# Patient Record
Sex: Male | Born: 1940 | Race: White | Hispanic: No | Marital: Single | Smoking: Never smoker
Health system: Southern US, Community
[De-identification: ages and names within clinical notes are randomized; demographics above are authoritative.]

## PROBLEM LIST (undated history)

## (undated) DIAGNOSIS — R251 Tremor, unspecified: Secondary | ICD-10-CM

## (undated) DIAGNOSIS — E162 Hypoglycemia, unspecified: Secondary | ICD-10-CM

## (undated) DIAGNOSIS — E039 Hypothyroidism, unspecified: Secondary | ICD-10-CM

## (undated) DIAGNOSIS — I480 Paroxysmal atrial fibrillation: Secondary | ICD-10-CM

## (undated) DIAGNOSIS — I341 Nonrheumatic mitral (valve) prolapse: Secondary | ICD-10-CM

## (undated) DIAGNOSIS — I509 Heart failure, unspecified: Secondary | ICD-10-CM

## (undated) DIAGNOSIS — G250 Essential tremor: Secondary | ICD-10-CM

## (undated) DIAGNOSIS — R569 Unspecified convulsions: Secondary | ICD-10-CM

## (undated) DIAGNOSIS — K219 Gastro-esophageal reflux disease without esophagitis: Secondary | ICD-10-CM

## (undated) DIAGNOSIS — I340 Nonrheumatic mitral (valve) insufficiency: Secondary | ICD-10-CM

## (undated) DIAGNOSIS — R519 Headache, unspecified: Secondary | ICD-10-CM

## (undated) DIAGNOSIS — I2510 Atherosclerotic heart disease of native coronary artery without angina pectoris: Secondary | ICD-10-CM

## (undated) HISTORY — PX: TONSILLECTOMY: SUR1361

## (undated) HISTORY — DX: Nonrheumatic mitral (valve) prolapse: I34.1

## (undated) HISTORY — DX: Heart failure, unspecified: I50.9

## (undated) HISTORY — DX: Atherosclerotic heart disease of native coronary artery without angina pectoris: I25.10

## (undated) HISTORY — DX: Hypoglycemia, unspecified: E16.2

## (undated) HISTORY — DX: Nonrheumatic mitral (valve) insufficiency: I34.0

## (undated) HISTORY — DX: Headache, unspecified: R51.9

## (undated) HISTORY — DX: Paroxysmal atrial fibrillation: I48.0

## (undated) HISTORY — PX: OTHER SURGICAL HISTORY: SHX169

---

## 2000-10-08 ENCOUNTER — Encounter: Payer: Self-pay | Admitting: Cardiology

## 2000-10-08 ENCOUNTER — Encounter: Admission: RE | Admit: 2000-10-08 | Discharge: 2000-10-08 | Payer: Self-pay | Admitting: Cardiology

## 2000-10-12 ENCOUNTER — Ambulatory Visit (HOSPITAL_COMMUNITY): Admission: RE | Admit: 2000-10-12 | Discharge: 2000-10-12 | Payer: Self-pay | Admitting: Cardiology

## 2001-12-20 ENCOUNTER — Encounter: Payer: Self-pay | Admitting: Cardiology

## 2001-12-20 ENCOUNTER — Inpatient Hospital Stay (HOSPITAL_COMMUNITY): Admission: AD | Admit: 2001-12-20 | Discharge: 2001-12-22 | Payer: Self-pay | Admitting: Cardiology

## 2002-01-31 ENCOUNTER — Ambulatory Visit (HOSPITAL_COMMUNITY): Admission: RE | Admit: 2002-01-31 | Discharge: 2002-01-31 | Payer: Self-pay | Admitting: Cardiology

## 2002-06-09 ENCOUNTER — Ambulatory Visit (HOSPITAL_COMMUNITY): Admission: RE | Admit: 2002-06-09 | Discharge: 2002-06-09 | Payer: Self-pay | Admitting: Cardiology

## 2003-01-04 ENCOUNTER — Encounter: Admission: RE | Admit: 2003-01-04 | Discharge: 2003-01-04 | Payer: Self-pay | Admitting: Cardiology

## 2003-01-19 ENCOUNTER — Encounter: Admission: RE | Admit: 2003-01-19 | Discharge: 2003-01-19 | Payer: Self-pay | Admitting: Cardiology

## 2003-01-21 HISTORY — PX: MITRAL VALVE REPAIR: SHX2039

## 2003-01-24 ENCOUNTER — Inpatient Hospital Stay (HOSPITAL_COMMUNITY): Admission: AD | Admit: 2003-01-24 | Discharge: 2003-02-05 | Payer: Self-pay | Admitting: Cardiology

## 2003-01-26 ENCOUNTER — Encounter (INDEPENDENT_AMBULATORY_CARE_PROVIDER_SITE_OTHER): Payer: Self-pay | Admitting: *Deleted

## 2003-02-22 ENCOUNTER — Encounter: Admission: RE | Admit: 2003-02-22 | Discharge: 2003-02-22 | Payer: Self-pay | Admitting: Cardiothoracic Surgery

## 2003-02-22 IMAGING — CR DG CHEST 2V
2 series · 2 of 2 positions shown · non-contrast
Comparison: none

CLINICAL DATA: Status post mitral valve replacement one month ago.  Slight cough.
 CHEST X-RAY
 Two views of the chest are compared to a chest x-ray from [REDACTED] dated [DATE].  The mild edema pattern has resolved, as have the small effusions.  Cardiomegaly is stable.  Median sternotomy sutures are noted. 
 IMPRESSION
 1.  Resolution of mild edema and small effusions.  No active process.
 2.  Stable cardiomegaly.

[view not recorded (1 of 2)]
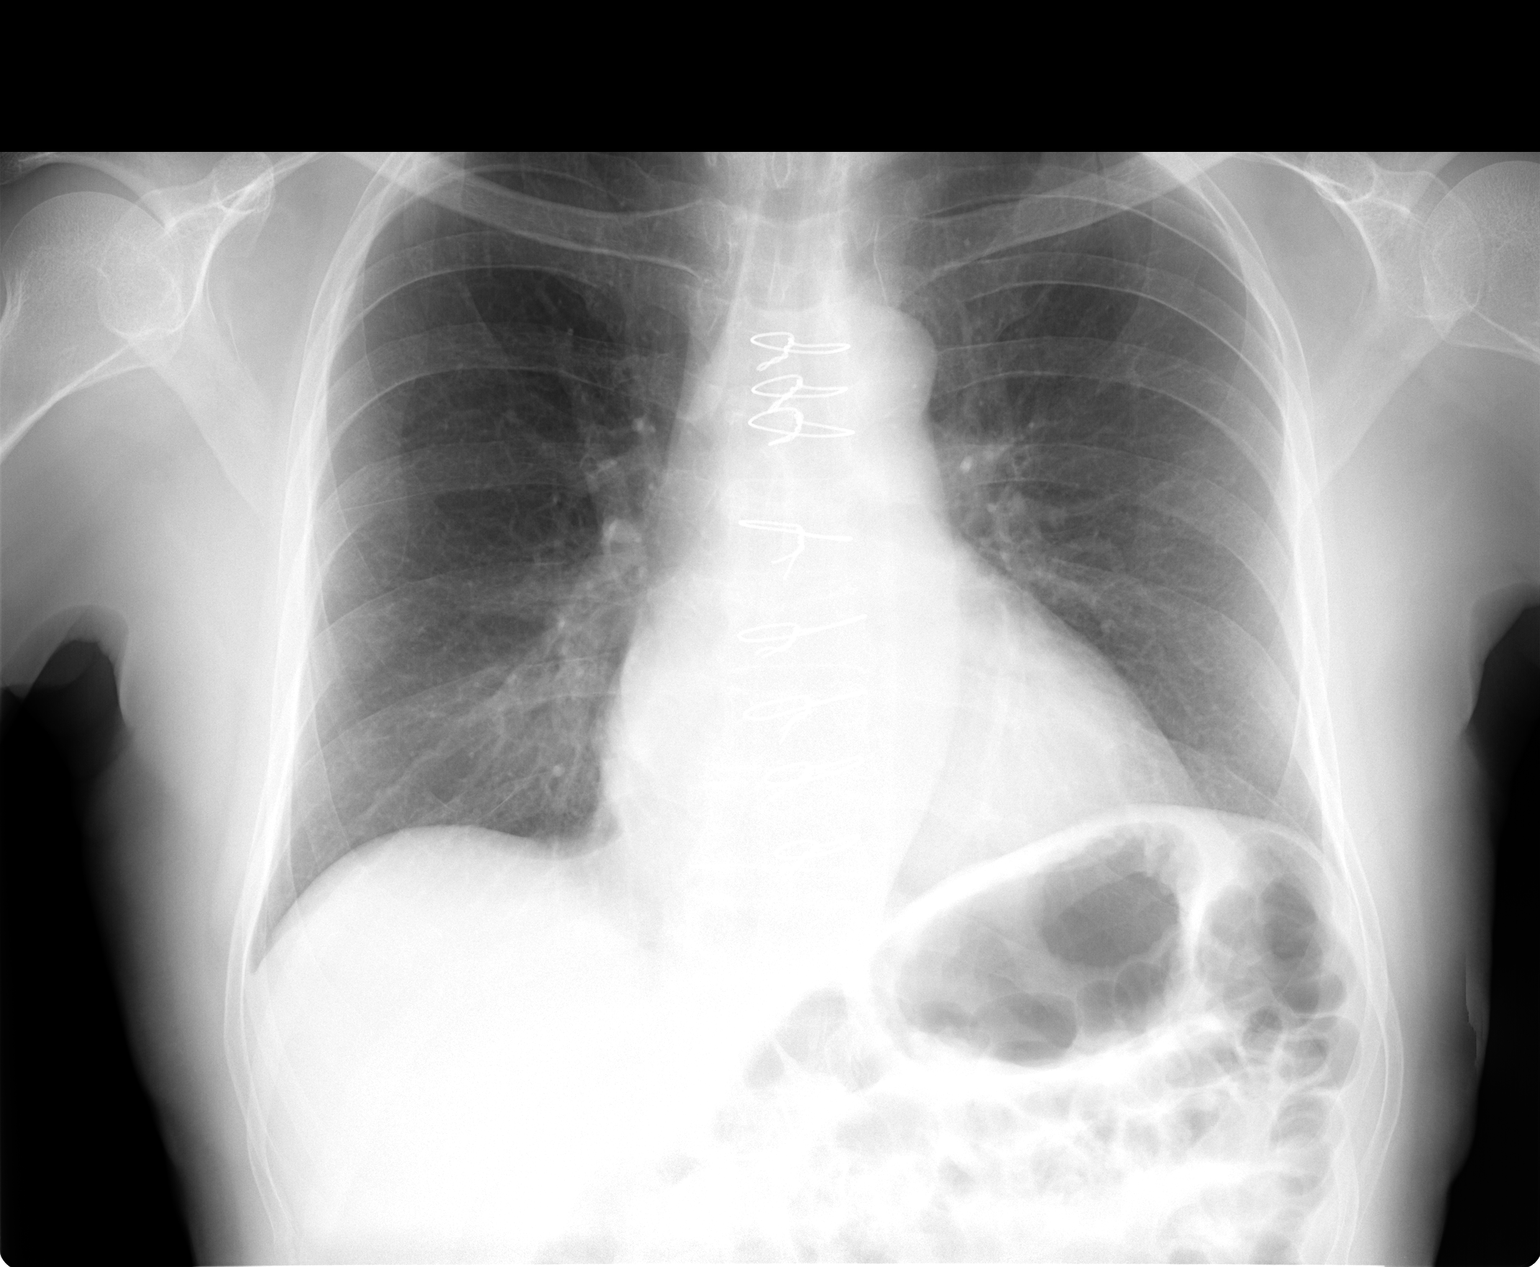

[view not recorded (2 of 2)]
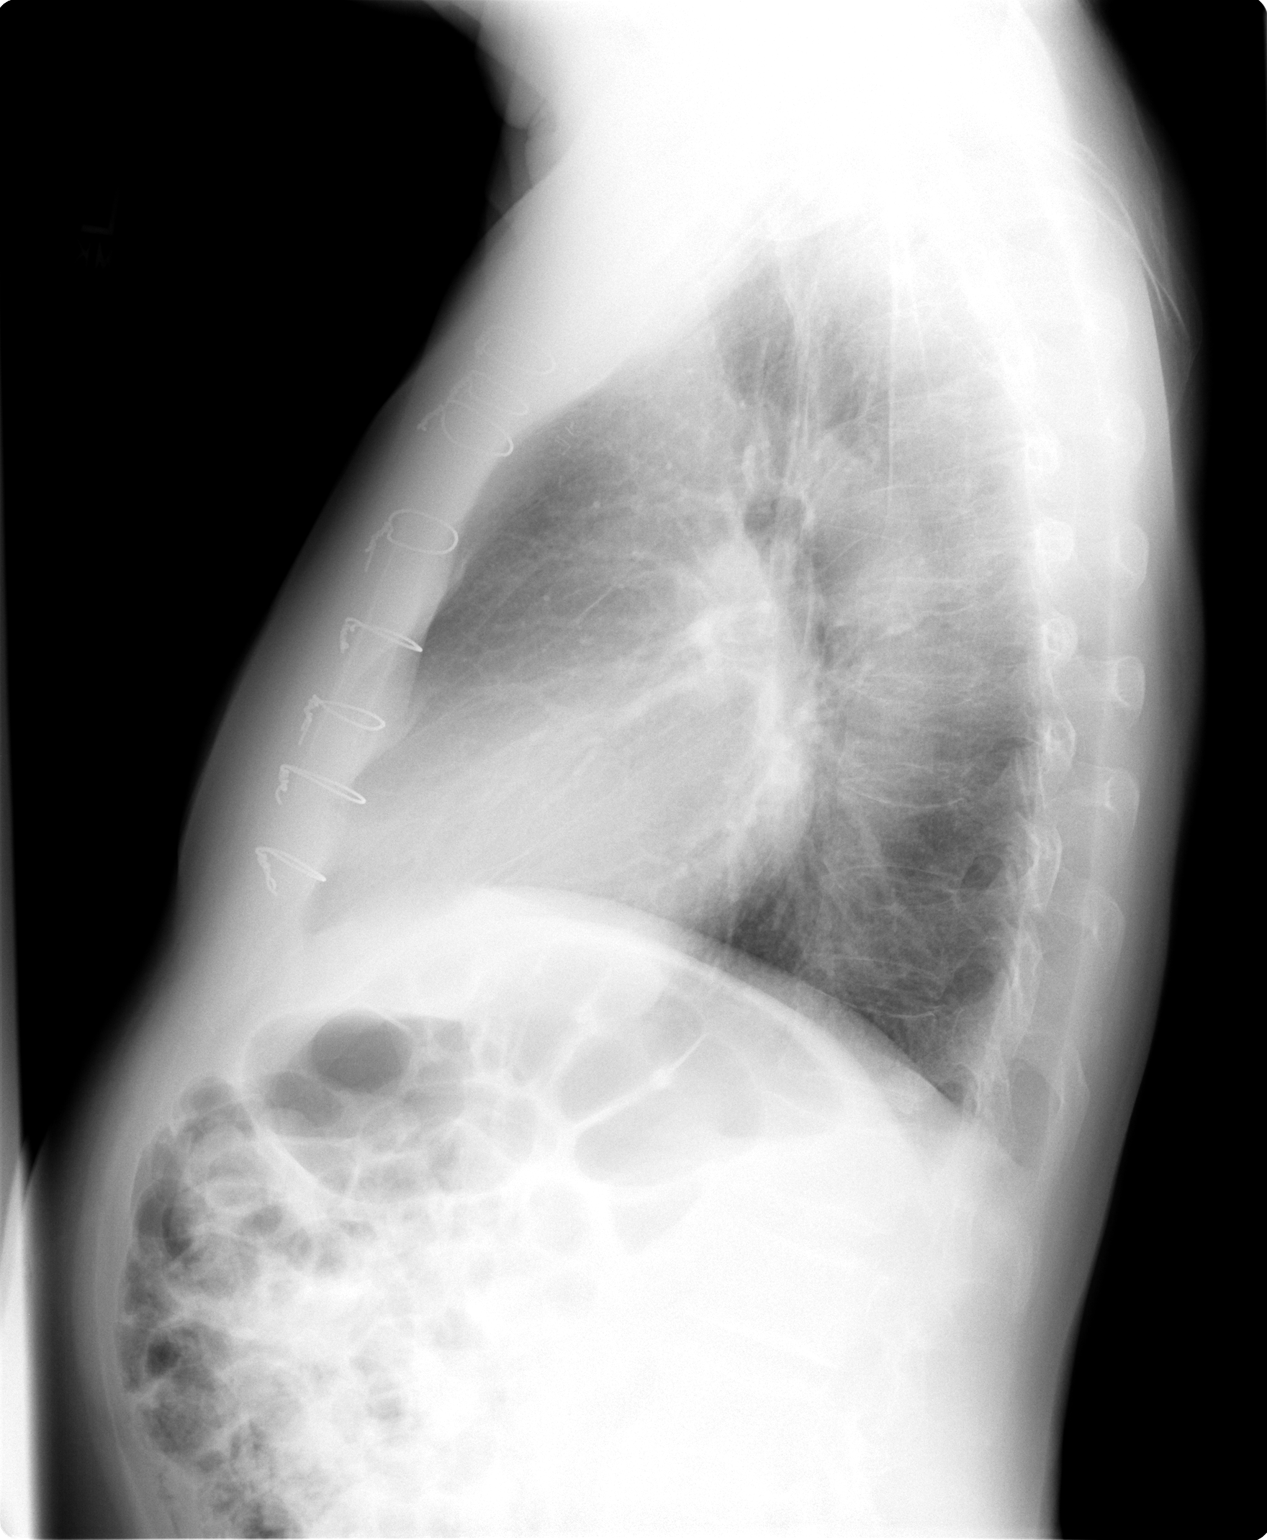

[2 of 2 positions shown; findings below may reference images not displayed]

## 2003-10-14 ENCOUNTER — Inpatient Hospital Stay (HOSPITAL_COMMUNITY): Admission: EM | Admit: 2003-10-14 | Discharge: 2003-10-15 | Payer: Self-pay | Admitting: Emergency Medicine

## 2003-10-14 IMAGING — CR DG CHEST 1V PORT
1 series · 1 of 1 positions shown · non-contrast
Comparison: none

CLINICAL DATA: Weakness.  
 PORTABLE CHEST [DATE] AT [2D] HOURS
 The heart is normal in size.  The lungs are clear.  Apical pleural thickening is stable.  Pulmonary vasculature is within normal limits. 
 IMPRESSION
 No evidence of acute cardiopulmonary disease.

[view not recorded]
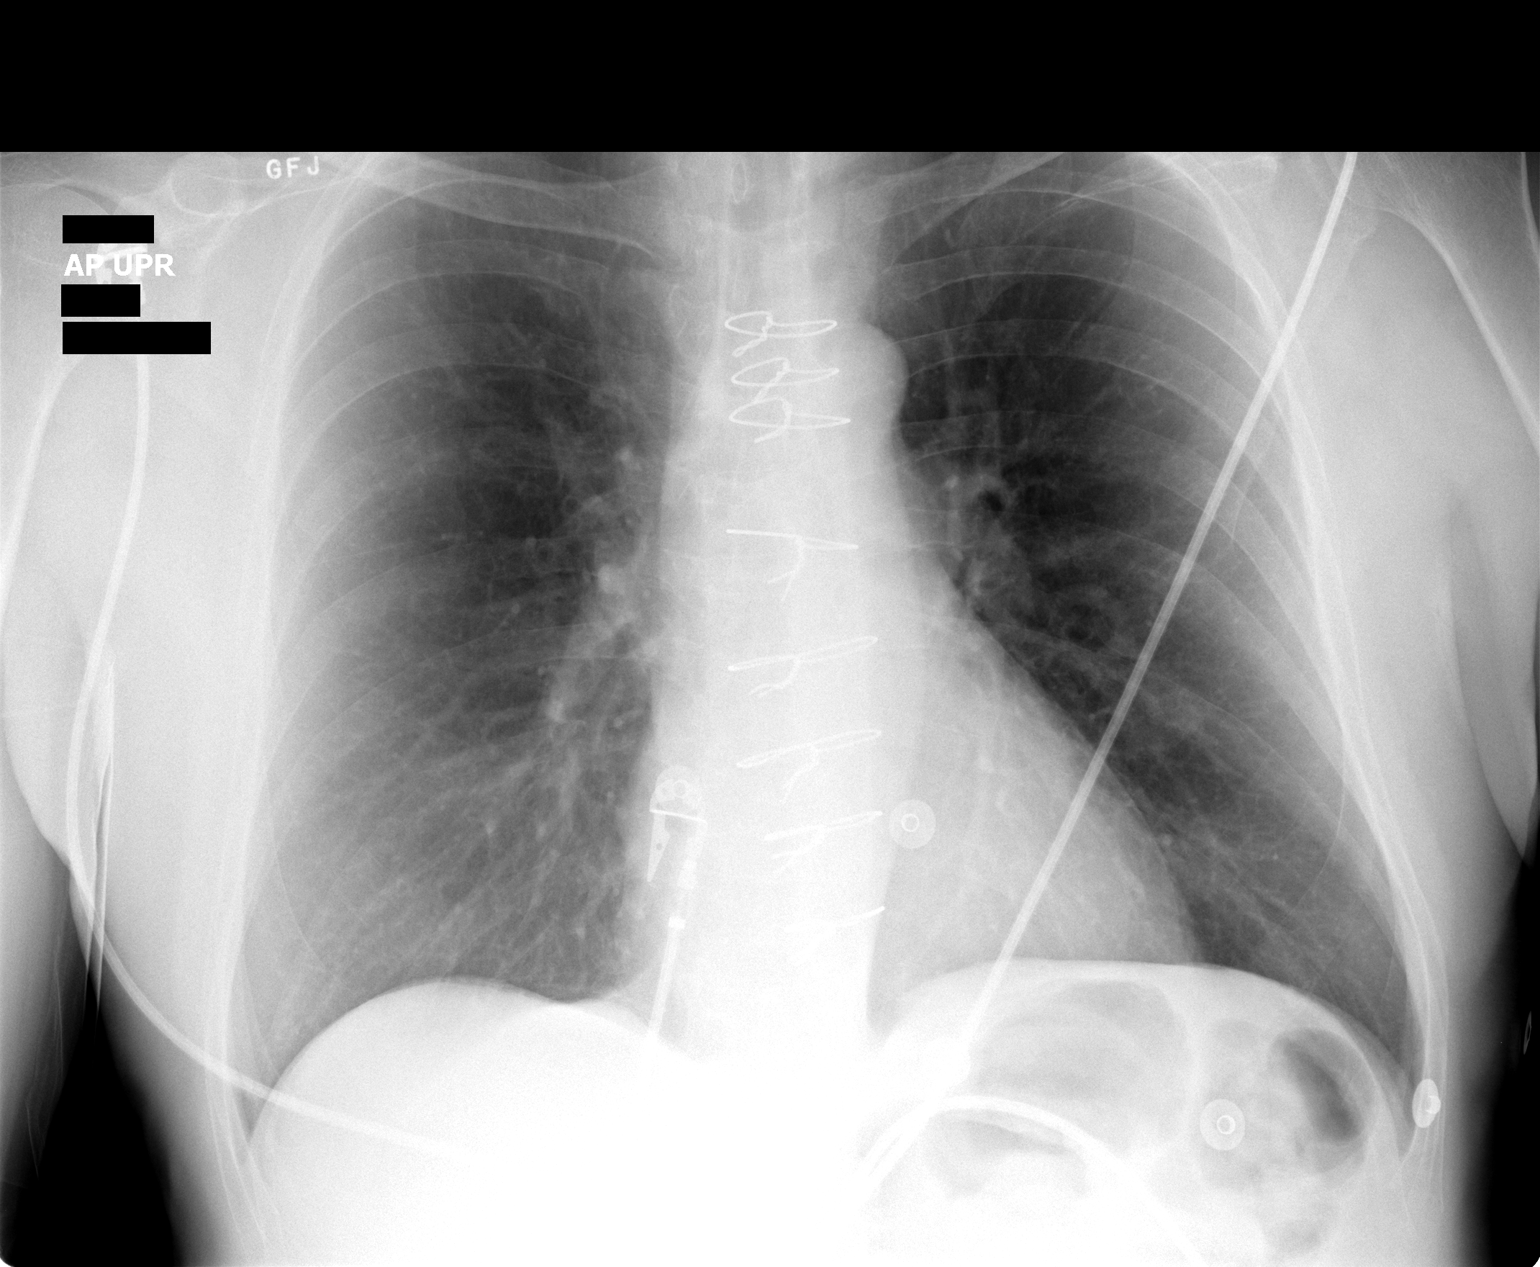

[1 of 1 positions shown; findings below may reference images not displayed]

## 2006-02-05 HISTORY — PX: OTHER SURGICAL HISTORY: SHX169

## 2006-09-25 ENCOUNTER — Emergency Department (HOSPITAL_COMMUNITY): Admission: EM | Admit: 2006-09-25 | Discharge: 2006-09-25 | Payer: Self-pay | Admitting: Emergency Medicine

## 2006-09-25 IMAGING — CR DG CHEST 2V
2 series · 2 of 2 positions shown · non-contrast
Comparison: [DATE].

CLINICAL DATA: Weakness.  Cough.  CABG.
 CHEST - 2 VIEW:

[w chest pa]
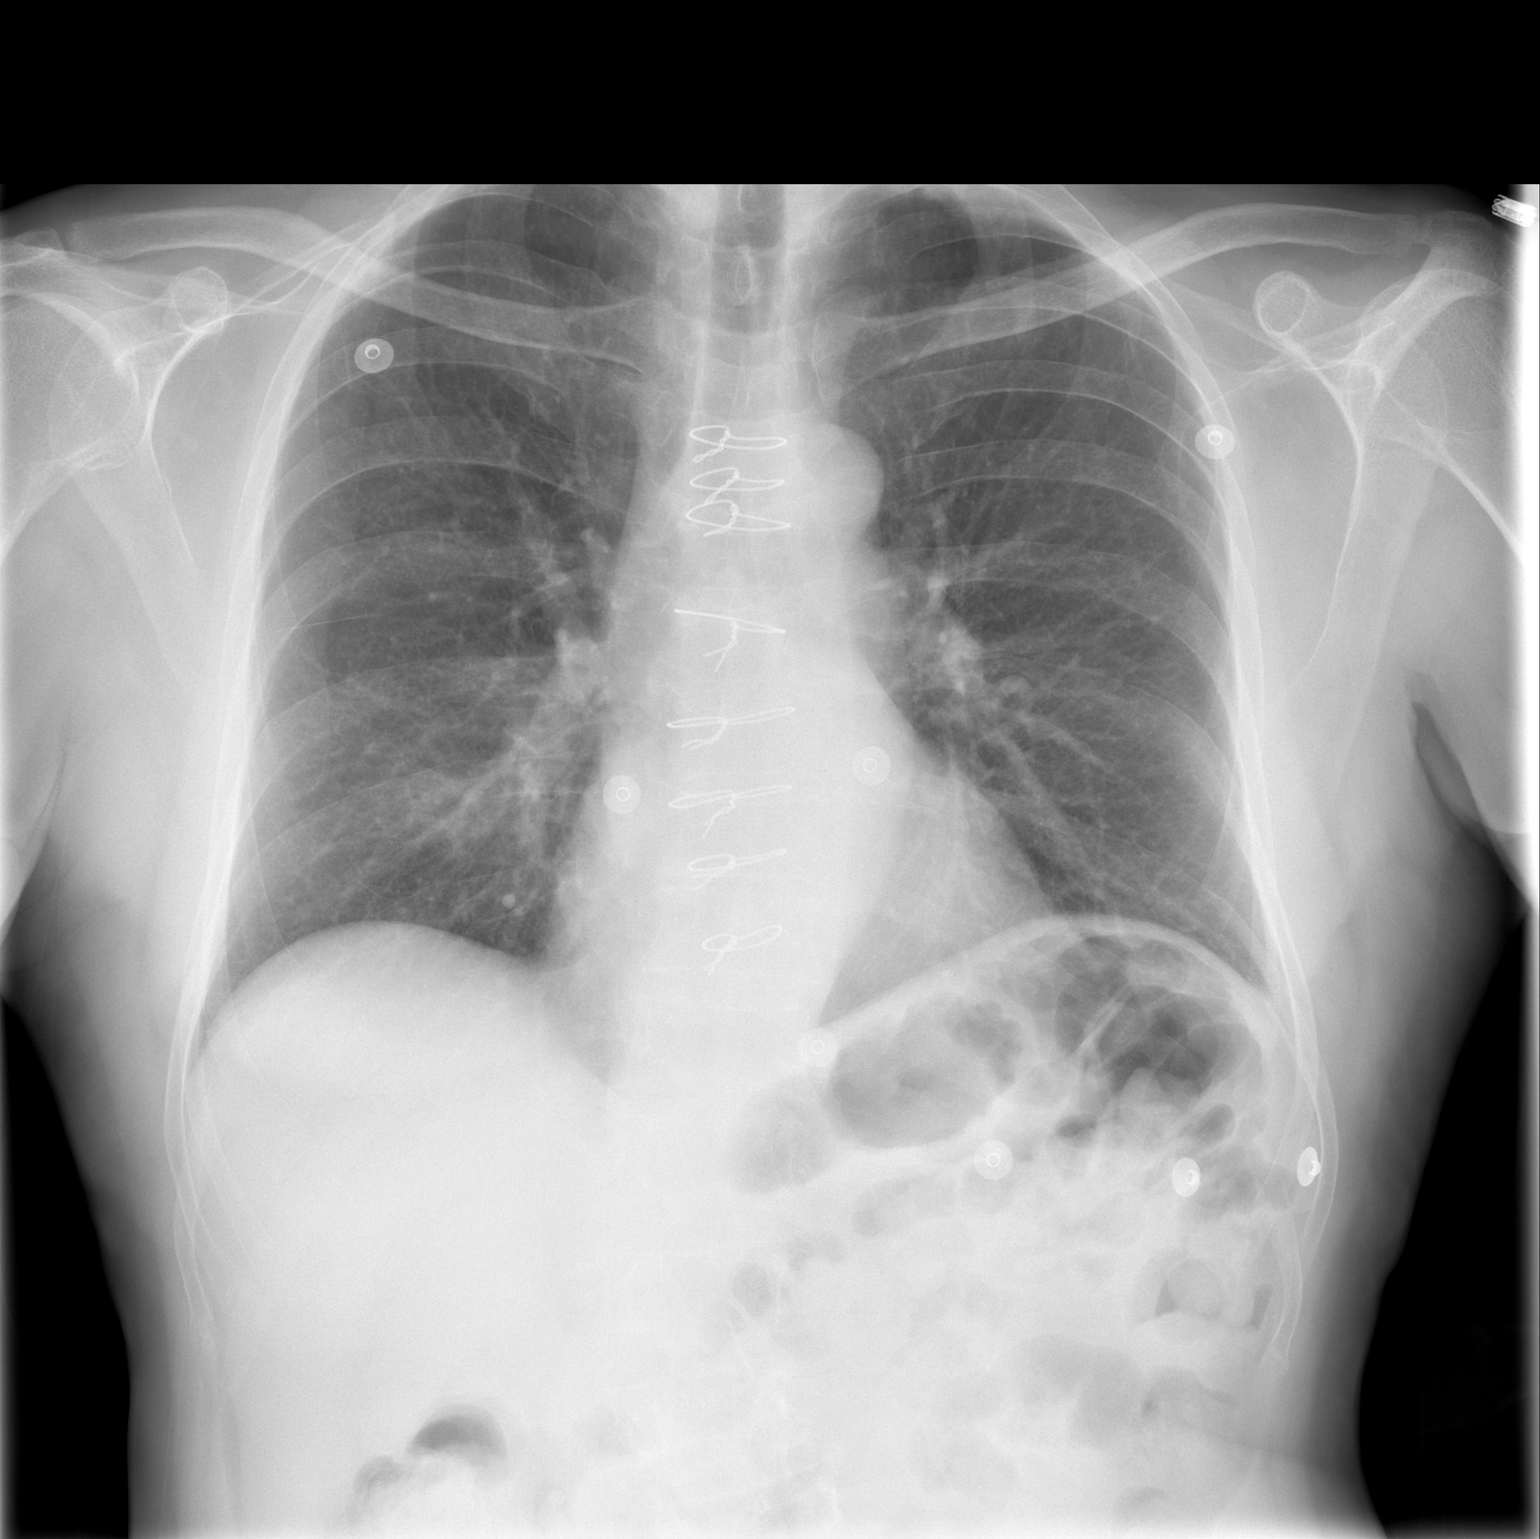

[w chest lat]
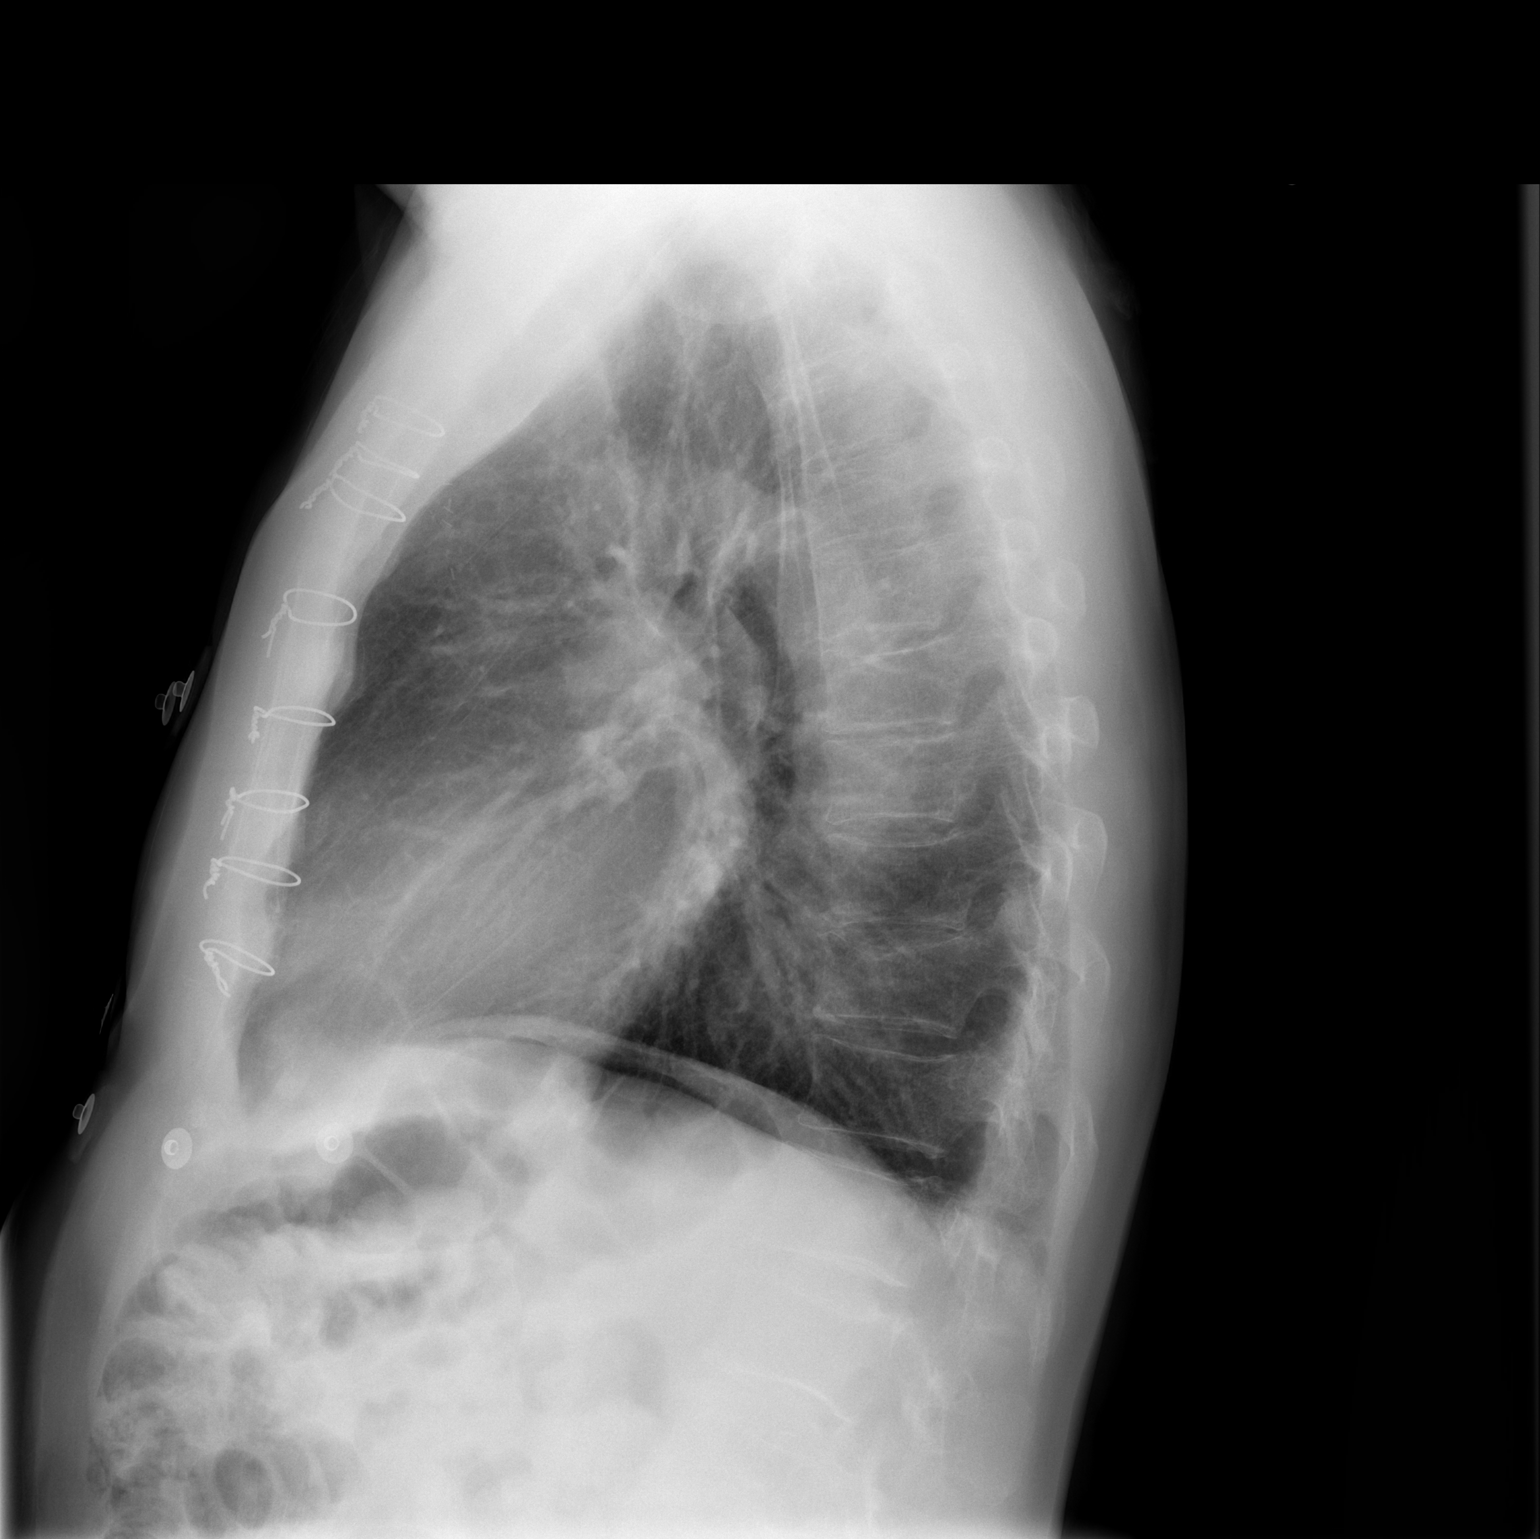

[2 of 2 positions shown; findings below may reference images not displayed]

FINDINGS: Sternal wire sutures are noted.  The heart size and mediastinal contours are within normal limits.  Both lungs are clear.  The visualized skeletal structures are unremarkable.
IMPRESSION: No active cardiopulmonary disease.

## 2010-06-07 NOTE — Consult Note (Signed)
NAME:  Austin Davis, Austin Davis                        ACCOUNT NO.:  [id number]   MEDICAL RECORD NO.:  [id number]                   PATIENT TYPE:  OIB   LOCATION:  6525                                 FACILITY:  [facility]   PHYSICIAN:  Mikey Bussing, M.D.           DATE OF BIRTH:  [birth year]   DATE OF CONSULTATION:  01/24/2003  DATE OF DISCHARGE:                                   CONSULTATION   REFERRING PHYSICIAN:  Thereasa Solo. Little, M.D.   PRIMARY CARE PHYSICIAN:  Nolon Nations, M.D. in [place].   REASON FOR CONSULTATION:  Severe mitral regurgitation with class III CHF and  atrial fibrillation.   CHIEF COMPLAINT:  Shortness of breath.   HISTORY OF PRESENT ILLNESS:  I was asked to evaluate this 70 year old white  male with recently diagnosed severe mitral regurgitation and recurrent  atrial fibrillation for potential mitral valve repair/replacement and a Maze  procedure.  The patient has been followed for mitral regurgitation over the  past few years and on serial echos, it has been moderate in intensity.  He  was noted to decline in his exercise tolerance, so that he gets short of  breath with regular activity as opposed to dyspnea with increased exertion  previously.  A recent two-dimensional echocardiogram showed his degree of  mitral regurgitation to be increased to severe and he was restudied with a  left and right heart catheterization today.  This demonstrated ejection  fraction of 45% with left ventricular dilatation, pulmonary artery pressures  of 62/32, normal coronaries, and severe 4+ mitral regurgitation.  He was in  atrial fibrillation on medications.  He was felt to be a candidate for  mitral valve repair with combined Maze procedure.  He was currently stable  in the hospital following cardiac catheterization and his Coumadin has been  off for approximately five days.  His INR today was 1.1.   PAST MEDICAL HISTORY:  1. 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  2. Atrial fibrillation, chronic.  3. Chronic Coumadin therapy.  4. Asthma.  5. Depression.  6. 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   ALLERGIES:  No known drug allergies.   MEDICATIONS:  1. Advair 250/50 b.i.d.  2. Paxil CR 25 mg daily.  3. Cardizem LA 180 mg daily.  4. Coumadin 7.5 mg alternating with 5 mg.  5. Ativan 0.5 mg b.i.d.  6. Lasix 40 mg daily.   SOCIAL HISTORY:  The patient does not smoke. He drinks two beers a week.  He  is single and has a girlfriend. He has two adult children.  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   FAMILY HISTORY:  No history of mitral valve disorder.  No history of  premature coronary disease.   REVIEW OF SYSTEMS:  The patient is right-hand dominant.  He sees a Education officer, community  twice a year and has had no recent  dental problems.  He denies any recent  respiratory infections requiring antibiotics or productive cough.  He denies  any significant bleeding problems with his chronic Coumadin therapy.  He has  had no previous surgical procedures other than his heart catheterization and  cardioversions.  His weight has been stable.  His vision has been stable,  but he has had some retinal tears in both eyes.   PHYSICAL EXAMINATION:  VITAL SIGNS:  He is 5 foot 10 inches and weight 165  pounds, blood pressure 115/80, pulse is 110 and irregular in atrial  fibrillation, oxygen saturation 95% on room air.  GENERAL:  Middle-aged, anxious male in the cardiac unit for observation  following cardiac catheterization.  He is in no acute distress.  HEENT:  Normocephalic and atraumatic.  Full EOM's.  NECK:  Without JVD, mass, or bruit.  LYMPHATICS:  No palpable adenopathy.  LUNGS:  Clear.  There is no thoracic deformity.  HEART:  Irregular tachycardia with a loud grade 4/6 holosystolic murmur  radiating to the left axilla. There is no S3 gallop.  ABDOMEN:  Soft and nontender without mass.  EXTREMITIES:  No  cyanosis, clubbing, or edema.  RECTAL:  Deferred.  VASCULAR:  2+ pulses in all extremities.  SKIN:  Without rash or lesion.  NEUROLOGY:  Alert and oriented x3 with full motor function.   LABORATORY DATA:  I reviewed his cardiac catheterization and agree with Dr.  Fredirick Maudlin interpretation of severe mitral regurgitation with left ventricular  dilatation and normal coronaries.  His atrial fibrillation has been more  difficult to control.  He would benefit from mitral valve repair/replacement  and a Maze procedure to help optimize his return to sinus rhythm.  I  discussed the details of the procedure with the patient and he is willing to  schedule his surgery while he is in the hospital at this time off his  Coumadin.  The surgery was scheduled for the morning of January 6.                                               Mikey Bussing, M.D.    PV/MEDQ  D:  01/24/2003  T:  01/24/2003  Job:  [id number]

## 2010-06-07 NOTE — Op Note (Signed)
Austin Davis, JUBB NO.:  [id number]   MEDICAL RECORD NO.:  [id number]                   PATIENT TYPE:  INP   LOCATION:  2399                                 FACILITY:  [facility]   PHYSICIAN:  Kerin Perna III, M.D.           DATE OF BIRTH:  01/09/1941   DATE OF PROCEDURE:  01/26/2003  DATE OF DISCHARGE:                                 OPERATIVE REPORT   PREOPERATIVE DIAGNOSIS:  Severe mitral regurgitation, paroxysmal atrial  fibrillation, congestive heart failure.   POSTOPERATIVE DIAGNOSIS:  Severe mitral regurgitation, paroxysmal atrial  fibrillation, congestive heart failure.   OPERATION PERFORMED:  1. Mitral valve repair with quadrangular resection of posterior leaflet,     sliding leaflet plasty, pericardial patch augmentation of T1 segment, and     ring annuloplasty.  2. Oversewing of left atrial appendage.  3. Maze procedure of the left atrium using Cardioblate system.   SURGEON:  Kerin Perna, M.D.   ASSISTANT:  1. Salvatore Decent. Cornelius Moras, M.D.  2. Pecola Leisure, [place]   ANESTHESIA:  General.   ANESTHESIOLOGIST:  Sheldon Silvan, M.D.   INDICATIONS FOR PROCEDURE:  The patient is a 70 year old male with  progressive symptoms of congestive heart failure, progressive atrial  fibrillation difficult to control, and a recent finding of severe mitral  regurgitation by 2D echocardiogram and cardiac catheterization.  His  coronaries were normal.  He is felt to be a candidate for mitral valve  repair and a Maze procedure.  Prior to surgery, I examined the patient in  his hospital room and reviewed the results of the cardiac catheterization  with the patient and his friends and family.  I discussed the indications  and expected benefits of mitral valve repair and Maze procedure for  treatment of his mitral regurgitation and paroxysmal atrial fibrillation.  I  reviewed the alternatives to surgery as well.  I discussed the major aspects  of the  planned operation including the choice for a mitral repair or a  mitral replacement, the location of the surgical incision, the use of  general anesthesia and cardiopulmonary bypass and the expected postoperative  hospital recovery.  I discussed with the patient the risks to him of mitral  valve repair and Maze procedure including the risks of MI, CVA, bleeding,  blood transfusion requirement, infection, recurrent atrial fibrillation, and  death.  He understood these implications for the surgery and agreed to  proceed with the operation as planned under what I felt was an informed  consent.   OPERATIVE FINDINGS:  The patient's heart was extremely dilated and globally  hypocontractile prior to bypass.  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  His cardiac output was 2.4 L per minute.  He was in  atrial fibrillation.  Following the mitral repair, the transesophageal echo  showed improved global left ventricular function with no mitral  regurgitation.   DESCRIPTION OF PROCEDURE:  The  patient was brought to the operating room and  placed supine on the operating table where general anesthesia was induced  under invasive hemodynamic monitoring.  The transesophageal 2-D  echocardiogram probe was placed by the anesthesiologist and the preoperative  diagnosis of severe mitral regurgitation was confirmed.  There was evidence  of a flail portion of the posterior mitral valve leaflet with severe  regurgitation.  A sternal incision was made and a sternotomy was performed.  Heparin was administered and the ACT was documented as being therapeutic.  The sternal retractor was placed and the pericardium was opened and  suspended in a pericardial cradle.  There was a large amount of pericardial  effusion drained.  Pursestrings were placed in the ascending aorta and right  atrium and the patient was cannulated and placed on bypass.  A second venous  cannula was placed into the inferior  vena cava.  Caval tapes were placed  both around the superior and inferior vena cava.  The interatrial groove was  dissected out and cardioplegia cannulas were placed for both antegrade and  retrograde delivery of cold blood cardioplegia.  The patient was cooled to  30 degrees and the aortic cross-clamp was applied.  A total of 800 mL of  cold blood cardioplegia was delivered to the aortic root and coronary sinus  in split doses with good cardioplegic arrest and septal temp dropping less  than 12 degrees.  Topical iced saline was used to augment myocardial  preservation and a pericardial insulator pad was used to protect the left  phrenic nerve.   The left atrial Maze procedure was performed first.  The Cardioblate system  was used.  The bipolar adjustable clamp was first placed around the left-  sided pulmonary veins with a cuff of left atrial tissue.  The bipolar device  was activated and left a lesion around the cuff of left pulmonary veins.  The bipolar device was then clamped around the left atrial appendage which  was also ablated using the radiofrequency lesion.  The left atrium was then  opened and the retractors were positioned.  The right-sided veins were then  isolated using the Cardioblate bipolar device passed posterior to the left  atrial wall and the device was activated thus encircling the right-sided  pulmonary veins with a surgical incision anterior and the radiofrequency  ablation line posteriorly.  Next, two ablation lines were accomplished on  the posterior left atrial wall using the bipolar device which was inserted  within the atrial chamber anteriorly and behind the atrial chamber in the  transverse sinus posteriorly.  The first ablation line was carried from the  right atrial pulmonary vein ablation lesion to the left-sided veins.  The  second line of ablation was carried from the right-sided pulmonary veins to the mitral annulus and this was completed right up to  the annulus using the  unipolar wand device.  The third lesion was carried from the pulmonary vein  encircling lesion to the left atrial appendage using the bipolar device.  This completed the Maze procedure within the left atrium.   Cardioplegia was redosed and was repeated every 20 minutes during cross-  clamp period.  The atrial retractor was then repositioned and the mitral  valve was inspected.  There was a large flail segment of P2 of the posterior  leaflet.  This also involved some of the medial aspect of the P1 segment.  The anterior leaflet appeared to be intact and the P3 segment of the  posterior leaflet also seemed to be intact.  The decision was made to make a  quadrangular resection of the flail leaflet which had several broken cords  along the free margin of the posterior leaflet.  This was performed and the  specimen sent to pathology.  The defect left of the posterior leaflet was  quite large and it was felt that a sliding leaflet plasty would be required  in order to reconstruct the valve.  Exposure of the posterior annulus was  enhanced by placing the 2-0 Ethibond annuloplasty sutures along the  posterior aspect of the valve and these were retracted.  Sliding leaflet  plasty incisions were made in the P3 segment and the P1 segment.  The P1  segment remaining after the quadrangular resection with limited tissue and  was felt that a patch augmentation would be required.  For that reason a  piece of pericardium was excised and treated with glutaraldehyde and rinsed  in saline.  While this was being performed, the P3 sliding leaflet plasty  was performed bringing the leaflet back towards the midportion of the  posterior annulus using a running 4-0 Ethibond suture.  After the P3 segment  was pulled back, attention was made to the autologous pericardial patch at  the P1 segment.  This was cut in a triangular configuration with the apex  being placed laterally underneath the  portion of P1 which was separated from  the annulus with a sliding incision.  A running 5-0 Ethibond was used to  place the pericardial autologous patch in the P1 segment.  This was  performed in two running suture lines.   Prior to doing the sliding leaflet plasty repairs, two large figure-of-eight  sutures of 2-0 Ethibond were placed in the center of the posterior annulus  bringing the annulus together and shortening the length of the posterior  leaflet.  After the sliding leaflet plasties were performed, the two edges  of the leaflets coapted very well and there was no tension. Two leaflets  were then sewn to each other in the midline using interrupted 5-0 Ethibonds.  After these sutures were tied the valve was inspected and had a symmetric  line of closure and good coaptation.  It was checked with cold saline and  there was minimal regurgitation.   The remaining annuloplasty sutures were placed around the circumference of the valve continuing around the anterior annulus.  Once all the annuloplasty  sutures were placed, the anterior leaflet was sized to a 30 mm Seguin  annuloplasty ring.  The annuloplasty sutures were then placed through the  annuloplasty ring and the ring was seated and the sutures were tied.  The  valve was then inspected and tested with cold saline and there was no  regurgitation.  The next step was to oversew the left atrial appendage which  was performed with two layers of a running 4-0 Prolene.  The atrium was then  irrigated with cold saline and the patient was rewarmed.  The left atriotomy  was closed with a running 4-0 Prolene in two layers.  Prior to closing the  atriotomy, air was vented from the left side of the heart using a dose of  retrograde warm blood cardioplegia and the usual deairing maneuvers.  The  atriotomy was then closed as the cross-clamp was removed.  The heart resumed  a spontaneous rhythm.  The patient was in a slow sinus rhythm and was   atrially paced after pacing wires were applied.  The patient was rewarmed to  37 degrees and cardioplegia cannula was removed.  The lungs were expanded  and the ventilator was resumed.  When the patient was warmed, he was weaned  from cardiopulmonary bypass on low dose dopamine and milrinone was added for  PA pressures of 45/25.  Cardiac output and blood pressure were stable and  protamine was administered.  The transesophageal 2-D echocardiogram post  bypass showed improved global left ventricular function with still some  posterolateral hypokinesia.  There was no mitral regurgitation residual  after the mitral repair.  The patient maintained an atrially paced sinus  rhythm.  Protamine was administered without adverse reaction.  The cannulas  were removed.  The mediastinum was irrigated with warm antibiotic  irrigation.  The superior pericardial fat was closed.  Two mediastinal chest  tubes were placed and brought out through a separate incision. The sternum  was closed with interrupted steel wire.  The pectoralis fascia was closed  with a running #1 Vicryl.  The subcutaneous and skin were closed with a  running Vicryl and sterile dressings were applied.  Total bypass time was  232 minutes with cross-clamp time of 175 minutes.                                               Mikey Bussing, M.D.    PV/MEDQ  D:  01/26/2003  T:  01/27/2003  Job:  [id number]   cc:   Thereasa Solo. Little, M.D.  [address]  [address]  [place]  [place] [address]  Fax: [phone]

## 2010-06-07 NOTE — H&P (Signed)
NAMEISHMEL, ACEVEDO                        ACCOUNT NO.:  [id number]   MEDICAL RECORD NO.:  [id number]                   PATIENT TYPE:  INP   LOCATION:  3734                                 FACILITY:  [facility]   PHYSICIAN:  Thereasa Solo. Little, M.D.              DATE OF BIRTH:  [birth year]   DATE OF ADMISSION:  12/20/2001  DATE OF DISCHARGE:                                HISTORY & PHYSICAL   ADMISSION DIAGNOSIS:  Rapid atrial fibrillation.   HISTORY OF PRESENT ILLNESS:  The patient is a 70 year old male who has known  mitral valve prolapse with moderate mitral regurgitation.  On Wednesday  (five days prior to this dictation) he had had URI type symptoms and at that  time began having generalized weakness, fatigue, and near syncope associated  with a rapid pulse.  This rapid pulse has continued until now and he  presents to the office with new onset rapid atrial fibrillation with rapid  ventricular response.   He denies chest pain, shortness of breath, PND, or orthopnea but he does  have dyspnea on exertion with minimal activity.  He had had a previous  cardiac catheterization on 10/12/00; 2+ MR, no coronary disease, normal LV  systolic function.  An echocardiogram in my office today, 12/20/01, shows  normal LV systolic function, left atrial dimension 5.2, moderate MR, pan  systolic prolapse of the posterior leads of the mitral valve with mild TR.  Of note, the left atrium 301 was only 3.7 cm.   He has been taking Drixoral several days for about a week intermittently for  URI type symptoms and has stopped his Lanoxin because of the URI, thinking  it made him feel bad.  He had had in the distant past some palpitations  which had been well controlled with Lanoxin but he had never had anything  that was sustained.   CURRENT MEDICATIONS:  1. Lanoxin 0.125 mg p.o. q.d. (he has not taken it in almost a week).  2. Aspirin 81 mg q.d.  3. Fosamax 40 mg q.week.   PAST MEDICAL HISTORY:  1.  Mitral valve prolapse.  2. Recurrent back problems.   SOCIAL HISTORY:  The patient is divorced.  He has two adult children.  He  has a Naval architect.  No cigarettes.  Occasional alcohol but not to  excess.  Moderate caffeine in the diet.   FAMILY HISTORY:  Positive for diabetes mellitus.   REVIEW OF SYSTEMS:  The patient uses SV prophylaxis on a regular basis.  He  has had no peripheral swelling or claudication.  He had not been sleeping  well with his URI and had complained of generalized aches and pains for  about a week, more myalgias.  He denies fever or chills.  Bowel habits  normal.  No blood in his bowel movement.  Appetite good.  Occasional  nocturia with no associated dysuria.  No musculoskeletal complaints other  than back.   PHYSICAL EXAMINATION:  VITAL SIGNS: Initial presentation to the office his  blood pressure was 100/80, he was in atrial fibrillation with ventricular  response 140+.  He was given 25 mg of oral Lopressor.  The heart rate slowed  to approximately 120.  Still atrial fibrillation with occasional flutter  waves and a blood pressure of 94/62.  NECK: Thyroid was nonpalpable.  No carotid bruits were heard.  LUNGS: Completely clear.  CARDIAC: Revealed a rapid irregular heart rhythm with a 2/6 systolic murmur  best heard at the apex of the heart.  ABDOMEN: Soft, nontender.  There were no masses.  I could not palpate the  liver edge.  EXTREMITIES: He had good pulses in the lower extremities.  No edema.  No  clubbing or cyanosis of the nailbeds.  SKIN: Warm and dry.   ASSESSMENT:  1. Atrial fibrillation with rapid ventricular response of about five days     duration with associated near syncope in the early portion four or five     days ago.  Relative hypotension.  2. Mitral valve prolapse with moderate mitral regurgitation resulting in     dilation of the atrium of 5.2 cm.  3. Normally the patient's blood pressure is around 110 systolic and in the      past had been on Lanoxin.  I had tried to avoid beta blockers and     Cardizem for his occasional palpitations because of its blood pressure     lowering effect.   PLAN:  The patient will be placed on IV heparin and Cardizem at 5 mg/h drip.  We will try to avoid a bolus and maintain a blood pressure greater than 90  systolic.  I will also restart his Lanoxin 0.25 mg b.i.d. today and q.d.  after that.  We will start him on Coumadin 7-1/2 mg q.d. with daily PTs.  A  CBC, TSH, and CMP have all been requested.                                               Thereasa Solo. Little, M.D.    ABL/MEDQ  D:  12/20/2001  T:  12/20/2001  Job:  [id number]

## 2010-06-07 NOTE — Discharge Summary (Signed)
NAME:  Austin Davis, Austin Davis NO.:  [id number]   MEDICAL RECORD NO.:  [id number]                   PATIENT TYPE:  INP   LOCATION:  2009                                 FACILITY:  [facility]   PHYSICIAN:  Kerin Perna, M.D.               DATE OF BIRTH:  01-30-1940   DATE OF ADMISSION:  01/24/2003  DATE OF DISCHARGE:  02/05/2003                                 DISCHARGE SUMMARY   HISTORY OF PRESENT ILLNESS:  This is a 70 year old male with a history of  mitral valve prolapse, mitral regurgitation, and paroxysmal atrial  fibrillation dating back to March 2001.  On a 2-D echocardiogram dated March  2001, the patient showed prolapse of the posterior leaflet of the mitral  valve with moderate MR, left atrium 3.2 cm, and normal left ventricular  function.  A cardiac catheterization done in September 2002 showed 2+ mitral  regurgitation, mitral valve prolapse, and normal coronary arteries.  He has  had recurrent atrial fibrillation requiring cardioversion in January 2004 as  well as May 2004.  An echocardiogram dated December 2003 showed his left  atrium to be 5.2 cm, normal left ventricular function, and moderate mitral  regurgitation.   He was seen recently and found to be in recurrent atrial fibrillation with  rapid ventricular response.  He was subsequently scheduled for cardiac  catheterization, admitted to the hospital for further medical management and  possible surgical intervention.   ALLERGIES:  STEROIDS increase heart rate.   MEDICATIONS ON ADMISSION:  1. Advair 250/50 b.i.d.  2. Paxil CR 25 mg daily.  3. Cardizem LA 180 mg daily.  4. Coumadin 7.5 mg Monday, Wednesday, and Friday, 5 mg Tuesday, Thursday,     Saturday, and Sunday.  5. Lorazepam 0.5 mg q.12h.  6. Vi-q-tuss 1 teaspoon q.6h.  7. Lasix 40 mg daily.   PAST MEDICAL HISTORY:  1. Hyperglycemia ?  2. History of cervical neck problems.  3. History of degenerative arthritis.  4. History of  depression.  5. History of asthma.  6. History of atrial fibrillation.  7. History of congestive heart failure.   For Social History, Family History, Review of Systems, and Physical  Examination, please see History and Physical done at time of admission.   HOSPITAL COURSE:  The patient was admitted and subsequently scheduled for  cardiac catheterization which was done on January 24, 2003, by Dr. Clarene Duke.  Findings included ejection fraction of 45% with left ventricular dilatation.  Pulmonary artery pressure was measured at 62/32.  He had normal coronary  arteries.  He had severe 4+ mitral regurgitation.  He was felt to be a  candidate for mitral valve repair with combined maze procedure, and this was  referred to Dr. Donata Clay for opinion. Dr. Donata Clay agreed to proceed with  the procedure.   Procedure:  On January 26, 2003, the patient was taken to the cardiac  operating  room where he underwent the following procedure.  1) Mitral valve  repair with quadrangular resection of posterior leaflet, sliding  leafletplasty, pericardial patch augmentation of T1 segment, and ring  Annuloplasty.  2)  Oversewing of left atrial appendage.  3)  Maze procedure  of the left atrium using the Cardioblade system.  The patient tolerated the  procedure well and was taken to the surgical intensive care unit in stable  condition.   Postoperative hospital course:  The patient has overall done quite well.  He  did have some intermittent atrial fibrillation/flutter events during the  postoperative period, and he has been treated medically as well as started  back on his Coumadin therapy.  He currently maintains a normal sinus rhythm.  He is otherwise hemodynamically stable.  He has required a moderate amount  of diuresis.  All routine lines, monitoring and drainage devices were  discontinued in a standard fashion.   The patient underwent aggressive pulmonary toilet and routine cardiac  rehabilitation.  He  did have some difficulties with sore throat and thrush  during the postoperative period, but this was treated and controlled to full  resolution.  The patient's rapid strep testing was negative on two  occasions.  His incisions are healing well without signs of infection.  His  most recent INR dated February 05, 2003, is 3.2.  He is felt to be quite  stable for discharge on today's date, February 05, 2003.   DISCHARGE MEDICATIONS:  1. Paxil 25 mg daily.  2. Xanax 0.25 mg twice daily.  3. Amiodarone 200 mg twice daily.  4. Coumadin 2.5 mg daily and as directed.  First INR to be drawn February 06, 2003, at Dr. Fredirick Maudlin office.  5. Advair 250/50 inhaler 1 puff twice daily.  6. Folic acid 1 mg daily.  7. Niferex 150 mg daily.  8. Magic Mouthwash 5 ml q.6h. swish and swallow.  9. Ultram 50 mg every 6 hours as needed.   ACTIVITY:  The patient is instructed not to drive, no heavy lifting more  than approximately 10 pounds.  He is to keep walking as instructed, increase  as tolerated.   DIET:  Low-saturated-fat, low-salt.   WOUND CARE:  The patient may shower and clean his incisions gently with soap  and water.   FOLLOW UP:  Will include Dr. Clarene Duke on March 07, 2003; Corine Shelter, P.A.,  on January 28.  Pro time to be drawn at the [place] office.  Dr. Clarene Duke  to manage dosing.  Followup with Dr. Donata Clay will be Friday, February 4 at  10 a.m. with a chest x-ray from the [facility].   FINAL DIAGNOSES:  1. Severe mitral regurgitation with Class III congestive heart failure.  2. Chronic atrial fibrillation, chronic Coumadin therapy.  3. History of asthma.  4. History of depression.  5. 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   LABORATORY DATA:  Most recent laboratories:  INR 3.2 on February 05, 2003.  Electrolytes: BUN and creatinine on February 04, 2003, as follows.  Sodium 133, potassium 4.1, chloride 102, carbon dioxide 25, BUN  9, creatinine 0.9,  glucose 91.  White blood cell count 9.6, hemoglobin 10, hematocrit 31,  platelet count 453.      Rowe Clack, P.A.-C.                    Kerin Perna, M.D.    Sherryll Burger  D:  02/05/2003  T:  02/05/2003  Job:  [id number]   cc:   Thereasa Solo. Little, M.D.  [address]  [address]  [place]  [place] [address]  Fax: [phone]   Kayleen Memos, M.D.

## 2010-06-07 NOTE — Cardiovascular Report (Signed)
   NAME:  Austin Davis, Austin Davis                        ACCOUNT NO.:  [id number]   MEDICAL RECORD NO.:  [id number]                   PATIENT TYPE:  OIB   LOCATION:  2854                                 FACILITY:  [facility]   PHYSICIAN:  Thereasa Solo. Little, M.D.              DATE OF BIRTH:  [birth year]   DATE OF PROCEDURE:  01/31/2002  DATE OF DISCHARGE:                              CARDIAC CATHETERIZATION   CARDIOVERSION NOTE:   DATE OF PROCEDURE:  01/31/02   INDICATIONS FOR TEST:  [name] is a 70 year old male who  developed atrial fibrillation the first of December.  He has been on  Coumadin now for at least five weeks with a therapeutic international  normalized ratio of 4.  He has mitral valve prolapse with moderate mitral  regurgitation and his atrial dimension on his echocardiogram on 12/03 was  5.2 cm.   PROCEDURE:  After obtaining appropriate level of anesthesia with 150 mg of  IV Ditropan, the patient was cardioverted with 200 watt seconds and  converted to sinus rhythm with a rate of 71.   He should be ready for discharge in two hours and will be re-evaluated in my  office tomorrow.   CONCLUSION:  Successful cardioversion from atrial fibrillation to sinus  rhythm.                                               Thereasa Solo. Little, M.D.    ABL/MEDQ  D:  01/31/2002  T:  01/31/2002  Job:  [id number]

## 2010-06-07 NOTE — Cardiovascular Report (Signed)
Austin Davis, Austin Davis                        ACCOUNT NO.:  [id number]   MEDICAL RECORD NO.:  [id number]                   PATIENT TYPE:  OIB   LOCATION:  2899                                 FACILITY:  [facility]   PHYSICIAN:  Thereasa Solo. Little, M.D.              DATE OF BIRTH:  08/18/1940   DATE OF PROCEDURE:  01/24/2003  DATE OF DISCHARGE:                              CARDIAC CATHETERIZATION   INDICATIONS FOR TEST:  Austin Davis is a 70 year old male who has mitral valve  prolapse and mitral regurgitation.  He had had a cardiac catheterization  performed September of 2002 that showed moderate MR and normal LV function.  Some time around the end of November he had a URI that did not clear and was  ultimately diagnosed as mild congestive heart failure.  Because of this he  is brought in for outpatient cardiac catheterization assuming that this is  the sentinel event of failure that will prompt valve replacement.   PROCEDURE:  The patient was prepped and draped in the usual sterile fashion  exposing the right groin.  Following local anesthetic with 1% Xylocaine the  Seldinger technique was employed and an 8-French introducer sheath was  placed into the right femoral vein, a 5-French introducer sheath in the  right femoral artery.  An 8-French Swan-Ganz catheter was then advanced  through its normal route into the pulmonary artery with hemodynamic  monitoring undertaken throughout each station.  Cardiac output by  thermodilution was then performed.  Oxygen saturations in the AO and in the  PA were performed.   A pigtail catheter was then advanced into the left ventricular cavity and  ventriculography in the RAO projection was performed.  Following this a Swan-  Ganz pullback was performed and then selective left and right coronary  arteriography.   COMPLICATIONS:  None.   EQUIPMENT:  An 8-French Swan-Ganz catheter, 5-French Judkins configuration  catheters.   RESULTS:  HEMODYNAMIC  MONITORING:  Right atrial pressure 11.  Right ventricular pressure 55/9.  Pulmonary  artery pressure 62/32.  Wedge 28 with a V-wave of 36.  Central aortic  pressure was 91/78.  Left ventricular pressure was 93/10.  The patient was  in atrial fibrillation with a ventricular response of around 100.   Cardiac output by thermodilution was 3.5 L/minute for a cardiac index of  1.8.   Fick output using AO saturation 93% and pulmonary artery saturation at 57%  resulted in a cardiac output of 3.3 L/minute and a cardiac index of 1.7.   VENTRICULOGRAPHY:  Ventriculography in the RAO projection revealed mild dilatation of the left  ventricle with mild global hypokinesis.  Ejection fraction calculated at  47%.  The end-diastolic pressure was 10.  There was 4+ mitral regurgitation  and the left atrial size was greater than the left ventricular cavity size.   CORONARY ARTERIOGRAPHY:  1. Left main normal.  2. LAD:  The LAD crossed  the apex of the heart, gave rise to two small     diagonals.  This system was free of disease.  3. Circumflex:  The circumflex gave rise to two small OM vessels that were     free of disease.  4. Right coronary artery:  This was a dominant vessel with a PDA and two     __________ posterolateral branches that were free of disease.   CONCLUSION:  1. No evidence of coronary artery disease.  2. Mild to moderate left ventricular dysfunction with a 47% ejection     fraction.  3. Marked/severe dilatation of the left atrium.  4. 4+ mitral regurgitation.  5. Elevated pulmonary artery pressures at 62/32.  6. Atrial fibrillation.   At this point the patient will be referred to CVTS for valve replacement  repair.  It may be that since he has had a history of intermittent atrial  fibrillation and is back in atrial fibrillation, a maze procedure may be  indicated at the same time.  The patient will be discharged to home today,  follow up in my office tomorrow, and will see CVTS  later this week.  For now  his Coumadin will be on hold until his upcoming surgery.                                               Thereasa Solo. Little, M.D.    ABL/MEDQ  D:  01/24/2003  T:  01/24/2003  Job:  [id number]   cc:   CVTS   Lucianne Lei, M.D.   Cath Lab

## 2010-06-07 NOTE — Cardiovascular Report (Signed)
   NAMEDALLIN, MCCORKEL                        ACCOUNT NO.:  [id number]   MEDICAL RECORD NO.:  [id number]                   PATIENT TYPE:  OIB   LOCATION:  2899                                 FACILITY:  [facility]   PHYSICIAN:  Thereasa Solo. Little, M.D.              DATE OF BIRTH:  August 14, 1940   DATE OF PROCEDURE:  06/09/2002  DATE OF DISCHARGE:                              CARDIAC CATHETERIZATION   INDICATIONS FOR PROCEDURE:  Mr. Beigel is a 70 year old male who has mild to  moderate mitral regurgitation and a left atrial dimension of 5.2 cm.  He has  had an episode of paroxysmal atrial fibrillation in December, and underwent  elective cardioversion in January 2004.  This was successful and he has had  no recurrent problems until 48 hours prior to this when he noticed more  tiredness and fatigue, and found to be back in atrial fibrillation with a  ventricular response in the 50's to 60's.  With an appropriate INR, he was  brought in for outpatient cardioversion.   DESCRIPTION OF PROCEDURE:  With anesthesia's assistance, he was given 150 mg  of IV penathol.  Once an appropriate level of anesthesia was obtained, he  was cardioverted using anterior posterior paddles at 200 watt seconds, he  converted to sinus rhythm with a single shock at a rate of 64.   He should be ready for discharge later today.  My biggest concern is with  his left atrial dimension being so large that he may not be a long-term  candidate to remain in sinus rhythm.  It should be pointed out that he was  relatively bradycardic with his atrial fibrillation on very low dose  Cardizem (180 mg once a day), and digoxin 0.25 mg, but had a subtherapeutic  digoxin level at 0.06.                                               Thereasa Solo. Little, M.D.    ABL/MEDQ  D:  06/09/2002  T:  06/09/2002  Job:  [id number]

## 2010-06-07 NOTE — Op Note (Signed)
Austin Davis, Austin Davis                        ACCOUNT NO.:  [id number]   MEDICAL RECORD NO.:  [id number]                   PATIENT TYPE:  INP   LOCATION:  2399                                 FACILITY:  [facility]   PHYSICIAN:  Sheldon Silvan, M.D.                   DATE OF BIRTH:  May 10, 1940   DATE OF PROCEDURE:  01/26/2003  DATE OF DISCHARGE:                                 OPERATIVE REPORT   PROCEDURE PERFORMED:  Intraoperative transesophageal echocardiography (TEE).   INDICATIONS FOR PROCEDURE:  [name] was brought to the operating room  today by Dr. Donata Clay for mitral valve repair or replacement.  It was felt  that during operation, the patient would benefit from use of TEE for both  diagnostic and monitoring purposes.   DESCRIPTION OF PROCEDURE:  After successful induction of general anesthesia  and endotracheal intubation, a sheath was placed on the [employer]  OmniPlane probe and it was lubricated adequately.  It was passed  successfully through the oropharynx after one pass in an atraumatic fashion.   It was noted to be in the esophagus and heart images appeared quite readily.  The left ventricle was imaged and found to be not violated or thickened.  There was a slightly hypokinetic area in the posteromedial portion of the  left ventricle.   The mitral valve was imaged and it was seen to be billowing in nature along  the posterior leaflet.  There was severe prolapse noted.  On Color Flow exam  there was 4+ regurgitation noted across the area where the posterior leaflet  was not coapting with the anterior leaflet.  There was no obvious sign of  chordae that were ruptured.  The left atrium was quite dilated.  The left  atrial appendage was imaged and found to have no clot or smoke.  The  pulmonary veins were imaged and there was reversal of flow noted on Doppler  exam.   Aortic valve was seen and was tricuspid in nature with good apposition of  the cusps.  There was  trace regurgitation through the aortic valve on Color  Flow exam.  The tricuspid valve only showed trace regurgitation as well.  Otherwise, it was normal in structure.  There was no PFO noted across the  interatrial septum under Color Flow exam.   The patient was placed on cardiopulmonary bypass by Dr. Donata Clay.  A maze  procedure was performed as well as repair of the mitral valve and placement  of annular ring.  Bypass was slowly weaned and on filling of the heart,  examination of the mitral valve showed that there was trace to 1+  regurgitation on Color Flow exam.  The left ventricle was unchanged in its  appearance to prebypass.  There was good apposition of the mitral valve  leaflets.  The aortic valve was unchanged and the rest of the examination of  the  heart was unchanged.  At the conclusion of the procedure, on moving to  the intensive care unit the probe was removed without difficulty.                                               Sheldon Silvan, M.D.    DC/MEDQ  D:  01/26/2003  T:  01/27/2003  Job:  [id number]

## 2010-06-07 NOTE — Discharge Summary (Signed)
Austin Davis, Austin Davis              ACCOUNT NO.:  [id number]   MEDICAL RECORD NO.:  [id number]          PATIENT TYPE:  INP   LOCATION:  4740                         FACILITY:  [facility]   PHYSICIAN:  Richard A. Alanda Amass, M.D.DATE OF BIRTH:  [birth year]   DATE OF ADMISSION:  10/14/2003  DATE OF DISCHARGE:  10/15/2003                                 DISCHARGE SUMMARY   DISCHARGE DIAGNOSES:  1.  Supraventricular tachycardia, resolved.  2.  Mitral valve repair and Maze procedure on January 26, 2003.   HISTORY OF PRESENT ILLNESS:  Presented to [facility] Emergency Room on October 14, 2003, after experiencing rapid heart rate at work.   The patient did have a history of atrial fibrillation post-operation,  treated with Amiodarone, was in sinus rhythm.  The Amiodarone was  discontinued approximately three weeks prior to this admission.  Since that  time, he has had episodic weakness, usually while he is working.  The day of  admission, he was at work, he developed a rapid heart rate, driven by a  friend to the emergency room.  Blood pressure initially was 95, he was weak.  He was given an IV fluid bolus and converted to a sinus rhythm.  Blood  pressure came up to 105.   He has had continued symptoms of exercise intolerance, no history of  diabetes.  He was admitted to [facility] for overnight observation with  plans to discharge the next morning which we did.   PAST MEDICAL HISTORY:  1.  Recent surgery as stated with postoperative atrial fibrillation.  He did      normal __________ at the cath.  His EF was 45%, he had 4+ mitral      regurgitation.  2.  He then on January 26, 2003, underwent mitral valve repair and ring      annuloplasty over __________ left atrial appendage, a Maze procedure to      the left atrium, and the patient tolerated the procedure well.  3.  Possible hypo/hyperglycemia.  4.  History of cervical neck problems.  5.  Degenerative arthritis.  6.  Depression.  7.   Asthma.  8.  History of atrial fibrillation prior to the surgery.  9.  History of congestive heart failure.   MEDICATIONS:  1.  Fosamax daily.  2.  Coumadin 5 mg daily, 7.5 mg on Friday's.  3.  Ativan p.r.n.  4.  Multivitamin daily.  5.  Vitamin B complex daily.  6.  Tylenol p.r.n.   FAMILY HISTORY:  See H&P.   SOCIAL HISTORY:  See H&P.   REVIEW OF SYSTEMS:  See H&P.   DISCHARGE PHYSICAL EXAMINATION:  VITAL SIGNS:  Blood pressure 105/74, pulse  57, respirations 18, temperature 97.1, O2 saturation 100% on room air.  Accu-  Chek was 70 that morning.  HEART:  S1 and S2.  Regular rate and rhythm.  LUNGS:  Clear.  ABDOMEN:  Positive bowel sounds.  EXTREMITIES:  Without edema.   LABORATORY DATA:  Hemoglobin 16.1, hematocrit 46.9, WBC 8.3, platelets 257.  Neutrophils 83, lymphs 11, monos 6, eosinophils 1, basophils 0.  Prior to  discharge, neutrophils were down to 73, lymphs 19.  Prothrombin time 23.9,  INR 2.9.  At time of discharge, INR was 3.3, PT of 25.8.   Chemistries:  Sodium 133, potassium 4.7, chloride 107, glucose 140, BUN 22,  total protein 6.2, albumin 4.1, AST 41, ALT 26, alkaline phosphatase 62,  total bilirubin was 0.8, direct bilirubin 0.2, and indirect bilirubin 0.6.  On October 15, 2003, potassium was stable, as well as his creatinine.  Cardiac enzymes initially:  CK 79, MB 2.1, troponin-I 0.01.  LDL cholesterol  was 89, HDL was 51, triglycerides 97, total cholesterol 159.   TSH 1.78.   EKG initially was supraventricular tachycardia.  The patient converted and  followup was sinus rhythm, normal EKG.  Dr. Jacinto Halim felt the initial EKG was  probably 2:1 flutter.   HOSPITAL COURSE:  Austin Davis was seen in the emergency room with SVT, was  hypertensive, given IV fluid bolus.  Chest x-ray was done, I do not have the  results in the chart.  Given IV fluid bolus, and by the time the patient was  ready to have Adenocard administered he had converted to sinus  rhythm.   He was kept overnight in the hospital without recurrence of the atrial  flutter.  He was concerned he may be hypoglycemic, and he was to follow up  with his primary care physician for evaluation of his glucose, and he had  already started that evaluation.   DISCHARGE MEDICATIONS:  1.  Fosamax as before.  2.  Coumadin 5 mg daily as before.  3.  Lopressor 50 mg 1/2 tab b.i.d.   ACTIVITY:  As tolerated.  May return to work on October 20, 2003.   DIET:  Low fat, low salt diet.   FOLLOWUP:  The office will call you with plans for an event monitor and an  appointment to see Dr. Clarene Duke in two weeks.       LRI/MEDQ  D:  11/14/2003  T:  11/14/2003  Job:  [id number]   cc:   Thereasa Solo. Little, M.D.  [address]  [address]  [place]  [place] [address]  Fax: [phone]   [facility]

## 2010-06-07 NOTE — Cardiovascular Report (Signed)
[place]  Patient:    Austin Davis, Austin Davis Visit Number: [id number] MRN: [id number]          Service Type: CAT Location: [facility] 2899 [id number] Attending Physician:  Loreli Dollar Proc. Date: 10/12/00 Admit Date:  10/12/2000   CC:         Pasty Spillers. Verlon Au., M.D.  Cath Lab   Cardiac Catheterization  INDICATIONS:  Ms. Mizzell is a 70 year old male who has mitral valve prolapse and substantial mitral regurgitation by echocardiogram.  Two years ago, he was told he needed a cardiac catheterization, but did not come back for follow-up. He presented five days earlier complaining of episodes of marked dyspnea and episodes of tachycardia with minimal activity and lightheadedness associated with near syncope.  He has not had chest pain.  He is brought in for right and left heart catheterization at this time.  DESCRIPTION OF PROCEDURE:  The patient was prepped and draped in the usual sterile fashion exposing the right groin.  Following local anesthetic with 1% Xylocaine, the Seldinger technique was employed and a 6 [place] introducer sheath was placed in the right femoral artery and an 8 French introducer sheath in the right femoral vein.  A Swan-Ganz catheter was then advanced through its normal route into the pulmonary artery.  Hemodynamic monitoring was undertaken throughout each station.  Oxygen saturations of both the PA and arterial system was obtained and cardiac output by thermodilution was performed.  Following this, ventriculography in both the RAO and LAO projection and selective right and left coronary arteriography was performed.  EQUIPMENT:  6 French Judkins configuration catheters, 8 French Swan-Ganz catheter.  COMPLICATIONS:  None.  RESULTS:  HEMODYNAMIC MONITORING:  Central aortic pressure 108/68, left ventricular pressure 107/7 with an end diastolic pressure of 23.  Right atrial pressure 7, right ventricular pressure 41/8,  pulmonary artery 41/22, wedge 22 with a V wave that was 30 mm.  VENTRICULOGRAPHY:  Ventriculography in both the LAO and RAO projection was performed.  The left ventricle was slightly dilated, but showed normal function.  Mitral valve prolapse was seen.  There was moderate 2+ mitral regurgitation seen.  CORONARY ARTERIOGRAPHY:  No calcification was noted on fluoroscopy.  Left main normal.  LAD.  The LAD gave rise to two diagonal branches.  The system was free of disease.  Circumflex.  The circumflex was free of disease.  Right coronary artery.  Normal.  Cardiac output by thermodilution was 3.6 liters per minute.  Cardiac index was 1.9.  Fick cardiac output was 3.4.  Oxygen saturation AO 98%, PA 69%.  CONCLUSION: 1. +2 mitral regurgitation. 2. Normal left ventricular systolic function. 3. Mild dilatation of left ventricle. 4. No coronary artery disease. 5. Slight elevation of pulmonary artery pressures.  DISCUSSION:  At this point it does not appear that the patient needs surgical repair.  I will start him on Lanoxin for his arrhythmia.  Since his blood pressure is relatively low, I will try to stay away from beta blockers and four calcium channel blockers because of the effect on blood pressure.  He will be discharged to home and rechecked in the morning. Attending Physician:  Loreli Dollar DD:  10/12/00 TD:  10/12/00 Job: [id number]

## 2010-11-01 LAB — POCT I-STAT CREATININE
Creatinine, Ser: 1
Operator id: 279831

## 2010-11-01 LAB — CBC
HCT: 44.1
Hemoglobin: 15.1
MCHC: 34.4
MCV: 89.6
Platelets: 206
RBC: 4.92
RDW: 13.8
WBC: 5.4

## 2010-11-01 LAB — I-STAT 8, (EC8 V) (CONVERTED LAB)
Acid-Base Excess: 4 — ABNORMAL HIGH
BUN: 15
Bicarbonate: 29.2 — ABNORMAL HIGH
Chloride: 103
Glucose, Bld: 94
HCT: 47
Hemoglobin: 16
Operator id: 279831
Potassium: 4.2
Sodium: 138
TCO2: 30
pCO2, Ven: 44.2 — ABNORMAL LOW
pH, Ven: 7.428 — ABNORMAL HIGH

## 2010-11-01 LAB — DIFFERENTIAL
Basophils Absolute: 0
Basophils Relative: 0
Eosinophils Absolute: 0.1
Eosinophils Relative: 2
Lymphocytes Relative: 20
Lymphs Abs: 1.1
Monocytes Absolute: 0.4
Monocytes Relative: 8
Neutro Abs: 3.7
Neutrophils Relative %: 69

## 2010-11-01 LAB — URINALYSIS, ROUTINE W REFLEX MICROSCOPIC
Bilirubin Urine: NEGATIVE
Glucose, UA: NEGATIVE
Hgb urine dipstick: NEGATIVE
Ketones, ur: NEGATIVE
Nitrite: NEGATIVE
Protein, ur: NEGATIVE
Specific Gravity, Urine: 1.015
Urobilinogen, UA: 0.2
pH: 7.5

## 2010-11-01 LAB — PROTIME-INR
INR: 2.8 — ABNORMAL HIGH
Prothrombin Time: 30.6 — ABNORMAL HIGH

## 2010-11-01 LAB — POCT CARDIAC MARKERS
CKMB, poc: 1.6
Myoglobin, poc: 64.9
Operator id: 279831
Troponin i, poc: <0.05

## 2010-11-01 LAB — DIGOXIN LEVEL: Digoxin Level: 0.6 — ABNORMAL LOW

## 2012-05-08 ENCOUNTER — Encounter: Payer: Self-pay | Admitting: Pharmacist Clinician (PhC)/ Clinical Pharmacy Specialist

## 2012-05-08 DIAGNOSIS — Z7901 Long term (current) use of anticoagulants: Secondary | ICD-10-CM | POA: Insufficient documentation

## 2012-05-08 DIAGNOSIS — I4891 Unspecified atrial fibrillation: Secondary | ICD-10-CM | POA: Insufficient documentation

## 2012-05-08 DIAGNOSIS — I480 Paroxysmal atrial fibrillation: Secondary | ICD-10-CM | POA: Insufficient documentation

## 2012-06-09 ENCOUNTER — Ambulatory Visit: Payer: Self-pay | Admitting: Pharmacist Clinician (PhC)/ Clinical Pharmacy Specialist

## 2012-06-23 ENCOUNTER — Ambulatory Visit (INDEPENDENT_AMBULATORY_CARE_PROVIDER_SITE_OTHER): Payer: BC Managed Care – PPO | Admitting: Pharmacist Clinician (PhC)/ Clinical Pharmacy Specialist

## 2012-06-23 VITALS — BP 122/76 | HR 76

## 2012-06-23 DIAGNOSIS — Z7901 Long term (current) use of anticoagulants: Secondary | ICD-10-CM

## 2012-06-23 DIAGNOSIS — I4891 Unspecified atrial fibrillation: Secondary | ICD-10-CM

## 2012-06-23 LAB — POCT INR: INR: 1.7

## 2012-08-05 ENCOUNTER — Ambulatory Visit: Payer: BC Managed Care – PPO | Admitting: Pharmacist Clinician (PhC)/ Clinical Pharmacy Specialist

## 2012-08-09 ENCOUNTER — Ambulatory Visit (INDEPENDENT_AMBULATORY_CARE_PROVIDER_SITE_OTHER): Payer: BC Managed Care – PPO | Admitting: Pharmacist Clinician (PhC)/ Clinical Pharmacy Specialist

## 2012-08-09 VITALS — BP 126/82 | HR 80

## 2012-08-09 DIAGNOSIS — Z7901 Long term (current) use of anticoagulants: Secondary | ICD-10-CM

## 2012-08-09 DIAGNOSIS — I4891 Unspecified atrial fibrillation: Secondary | ICD-10-CM

## 2012-08-09 LAB — POCT INR: INR: 2.7

## 2012-09-27 ENCOUNTER — Other Ambulatory Visit: Payer: Self-pay | Admitting: Pharmacist Clinician (PhC)/ Clinical Pharmacy Specialist

## 2012-09-27 MED ORDER — WARFARIN SODIUM 5 MG PO TABS: 5.0000 mg | ORAL_TABLET | Freq: Every day | ORAL | Status: DC

## 2012-10-04 ENCOUNTER — Telehealth: Payer: Self-pay | Admitting: Pharmacist Clinician (PhC)/ Clinical Pharmacy Specialist

## 2012-10-04 NOTE — Telephone Encounter (Signed)
Overdue INR letter sent 

## 2013-02-17 ENCOUNTER — Telehealth: Payer: Self-pay | Admitting: *Deleted

## 2013-02-27 ENCOUNTER — Other Ambulatory Visit: Payer: Self-pay | Admitting: Pharmacist Clinician (PhC)/ Clinical Pharmacy Specialist

## 2013-03-01 ENCOUNTER — Other Ambulatory Visit: Payer: Self-pay | Admitting: Pharmacist Clinician (PhC)/ Clinical Pharmacy Specialist

## 2013-04-13 ENCOUNTER — Ambulatory Visit: Payer: BC Managed Care – PPO | Admitting: Pharmacist Clinician (PhC)/ Clinical Pharmacy Specialist

## 2013-04-18 ENCOUNTER — Ambulatory Visit (INDEPENDENT_AMBULATORY_CARE_PROVIDER_SITE_OTHER): Payer: BC Managed Care – PPO | Admitting: Cardiology

## 2013-04-18 VITALS — BP 142/86 | Ht 70.0 in | Wt 167.0 lb

## 2013-04-18 DIAGNOSIS — R5383 Other fatigue: Secondary | ICD-10-CM

## 2013-04-18 DIAGNOSIS — E782 Mixed hyperlipidemia: Secondary | ICD-10-CM

## 2013-04-18 DIAGNOSIS — R0600 Dyspnea, unspecified: Secondary | ICD-10-CM

## 2013-04-18 DIAGNOSIS — R0989 Other specified symptoms and signs involving the circulatory and respiratory systems: Secondary | ICD-10-CM

## 2013-04-18 DIAGNOSIS — R5381 Other malaise: Secondary | ICD-10-CM

## 2013-04-18 DIAGNOSIS — R0609 Other forms of dyspnea: Secondary | ICD-10-CM

## 2013-04-18 NOTE — Patient Instructions (Signed)
WE WILL SCHEDULE An ECHO of your heart   Your physician recommends that you schedule a follow-up appointment in: one week to see the PA  Have you bloodwork done on a day that you have been fasting

## 2013-04-19 ENCOUNTER — Telehealth: Payer: Self-pay | Admitting: *Deleted

## 2013-04-19 DIAGNOSIS — Z7901 Long term (current) use of anticoagulants: Secondary | ICD-10-CM

## 2013-04-19 NOTE — Telephone Encounter (Signed)
Spoke to patient. RN asked were was he planning to have labs drawn.  Pt states he will use [facility] on [address].  RN informed patient will place order in the computer.RN informed patient to let technician be aware because there will be 2 order slips

## 2013-04-19 NOTE — Telephone Encounter (Signed)
Pt wanted to know if he can have his coumadin drawn when he has his other labs drawn on Tuesday to save him a trip out here.  [place]

## 2013-04-22 ENCOUNTER — Ambulatory Visit: Payer: BC Managed Care – PPO | Admitting: Pharmacist Clinician (PhC)/ Clinical Pharmacy Specialist

## 2013-04-22 ENCOUNTER — Encounter: Payer: Self-pay | Admitting: Cardiology

## 2013-04-22 DIAGNOSIS — R0609 Other forms of dyspnea: Secondary | ICD-10-CM

## 2013-04-22 DIAGNOSIS — R5383 Other fatigue: Secondary | ICD-10-CM | POA: Insufficient documentation

## 2013-04-22 NOTE — Progress Notes (Signed)
Patient ID: Austin Davis, male   DOB: 1941/01/04, 73 y.o.   MRN: [id number]    04/22/2013 Austin Davis   [birth year]  [id number]  Primary Physician: None  Primary Cardiologist: Dr. Rex Davis  HPI:  Austin Davis is a 73 y/o male, previously followed by Dr. Rex Davis, with a history of mitral valve repair and Maze procedure by Dr. Prescott Davis in 2005. He had had severe mitral valve prolapse with severe mitral regurgitation and congestive heart failure prior to the repair.  He has not followed up since Dr. Rex Davis retired. He is not currently followed by a PCP. He presents to clinic today with a complaint of recent increased DOE and fatigue. He often gets very tired after completing light household chores and yard work that was once easy for him. He denies any resting dyspnea. No orthopnea, PND or LEE. No exertional chest pain, palpitations, dizziness, syncope or near syncope. He also denies hematochezia and melena.    Current Outpatient Prescriptions  Medication Sig Dispense Refill  . warfarin (COUMADIN) 5 MG tablet TAKE 1 TABLET (5 MG TOTAL) BY MOUTH DAILY.  40 tablet  3   No current facility-administered medications for this visit.    Allergies not on file  History   Social History  . Marital Status: Single    Spouse Name: N/A    Number of Children: N/A  . Years of Education: N/A   Occupational History  . Not on file.   Social History Main Topics  . Smoking status: Not on file  . Smokeless tobacco: Not on file  . Alcohol Use: Not on file  . Drug Use: Not on file  . Sexual Activity: Not on file   Other Topics Concern  . Not on file   Social History Narrative  . No narrative on file     Review of Systems: General: negative for chills, fever, night sweats or weight changes.  Cardiovascular: negative for chest pain, dyspnea on exertion, edema, orthopnea, palpitations, paroxysmal nocturnal dyspnea or shortness of breath Dermatological: negative for rash Respiratory: negative  for cough or wheezing Urologic: negative for hematuria Abdominal: negative for nausea, vomiting, diarrhea, bright red blood per rectum, melena, or hematemesis Neurologic: negative for visual changes, syncope, or dizziness All other systems reviewed and are otherwise negative except as noted above.    Blood pressure 142/86, height 5\' 10"  (1.778 m), weight 167 lb (75.751 kg).  General appearance: alert, cooperative and no distress Neck: no JVD and supple, symmetrical, trachea midline Lungs: clear to auscultation bilaterally Heart: regular rate and rhythm, S1, S2 normal, no murmur, click, rub or gallop Extremities: no LEE Pulses: 2+ and symmetric Skin: warm and dry Neurologic: Grossly normal  EKG NSR; ventricular rate 78 bpm  ASSESSMENT AND PLAN:   DOE (dyspnea on exertion) No signs of acute heart failure. With history of mitral valve disease, will order a 2D echo to reassess valvular anatomy. Will also assess systolic and diastolic function, as well as look for wall motion abnormalities. Will check a CBC to rule out anemia.   Fatigue 2D echo to r/o cardiac etiologies. CBC to r/o anemia. TSH to assess for hypothyroidism and Hgb A1c to check for diabetes.     PLAN  Will look for cardiac causes starting with a 2D echo. If wall motion abnormalities are noted then we will proceed with a NST, however in the absence of exertional chest pain, Im doubtful that symptoms are due to coronary ischemia. Will also r/u CHF and  valvular diease. Since he does not have an established PCP, I have also ordered a CBC, TSH, Hgb A1c, as well as a lipid panel. Pt was encouraged to establish care with a new provider. Patient instructed to return in 1-2 weeks to review findings.     [place], BRITTAINYPA-C 04/22/2013 2:58 PM

## 2013-04-22 NOTE — Assessment & Plan Note (Signed)
No signs of acute heart failure. With history of mitral valve disease, will order a 2D echo to reassess valvular anatomy. Will also assess systolic and diastolic function, as well as look for wall motion abnormalities. Will check a CBC to rule out anemia.

## 2013-04-22 NOTE — Assessment & Plan Note (Signed)
2D echo to r/o cardiac etiologies. CBC to r/o anemia. TSH to assess for hypothyroidism and Hgb A1c to check for diabetes.

## 2013-04-26 ENCOUNTER — Ambulatory Visit (HOSPITAL_COMMUNITY)
Admission: RE | Admit: 2013-04-26 | Discharge: 2013-04-26 | Disposition: A | Discharge status: Home / Self Care | Payer: BC Managed Care – PPO | Source: Ambulatory Visit | Attending: Internal Medicine | Admitting: Internal Medicine

## 2013-04-26 DIAGNOSIS — I359 Nonrheumatic aortic valve disorder, unspecified: Secondary | ICD-10-CM

## 2013-04-26 DIAGNOSIS — R0989 Other specified symptoms and signs involving the circulatory and respiratory systems: Principal | ICD-10-CM | POA: Insufficient documentation

## 2013-04-26 DIAGNOSIS — R0609 Other forms of dyspnea: Secondary | ICD-10-CM | POA: Insufficient documentation

## 2013-04-26 DIAGNOSIS — R0600 Dyspnea, unspecified: Secondary | ICD-10-CM

## 2013-04-26 NOTE — Progress Notes (Signed)
2D Echo Performed 04/26/2013    Austin Davis, RCS

## 2013-04-27 ENCOUNTER — Ambulatory Visit (INDEPENDENT_AMBULATORY_CARE_PROVIDER_SITE_OTHER): Payer: BC Managed Care – PPO | Admitting: Pharmacist Clinician (PhC)/ Clinical Pharmacy Specialist

## 2013-04-27 DIAGNOSIS — Z7901 Long term (current) use of anticoagulants: Secondary | ICD-10-CM

## 2013-04-27 DIAGNOSIS — I4891 Unspecified atrial fibrillation: Secondary | ICD-10-CM

## 2013-04-27 LAB — LIPID PANEL
Cholesterol: 179 mg/dL (ref 0–200)
HDL: 50 mg/dL (ref >39)
LDL Cholesterol: 112 mg/dL — ABNORMAL HIGH (ref 0–99)
Total CHOL/HDL Ratio: 3.6 Ratio
Triglycerides: 84 mg/dL (ref <150)
VLDL: 17 mg/dL (ref 0–40)

## 2013-04-27 LAB — CBC
HCT: 46.5 % (ref 39.0–52.0)
HEMOGLOBIN: 16 g/dL (ref 13.0–17.0)
MCH: 30.5 pg (ref 26.0–34.0)
MCHC: 34.4 g/dL (ref 30.0–36.0)
MCV: 88.7 fL (ref 78.0–100.0)
Platelets: 276 10*3/uL (ref 150–400)
RBC: 5.24 MIL/uL (ref 4.22–5.81)
RDW: 14.2 % (ref 11.5–15.5)
WBC: 4.9 10*3/uL (ref 4.0–10.5)

## 2013-04-27 LAB — PROTIME-INR
INR: 2.92 — ABNORMAL HIGH (ref <1.50)
Prothrombin Time: 29.7 seconds — ABNORMAL HIGH (ref 11.6–15.2)

## 2013-04-27 LAB — HEMOGLOBIN A1C
Hgb A1c MFr Bld: 6.1 % — ABNORMAL HIGH (ref <5.7)
Mean Plasma Glucose: 128 mg/dL — ABNORMAL HIGH (ref <117)

## 2013-04-27 LAB — TSH: TSH: 1.891 u[IU]/mL (ref 0.350–4.500)

## 2013-04-28 ENCOUNTER — Encounter: Payer: Self-pay | Admitting: *Deleted

## 2013-05-03 ENCOUNTER — Ambulatory Visit (INDEPENDENT_AMBULATORY_CARE_PROVIDER_SITE_OTHER): Payer: BC Managed Care – PPO | Admitting: Cardiology

## 2013-05-03 ENCOUNTER — Encounter: Payer: Self-pay | Admitting: Cardiology

## 2013-05-03 VITALS — BP 124/82 | HR 76 | Ht 70.0 in | Wt 163.8 lb

## 2013-05-03 DIAGNOSIS — R5381 Other malaise: Secondary | ICD-10-CM

## 2013-05-03 DIAGNOSIS — E785 Hyperlipidemia, unspecified: Secondary | ICD-10-CM

## 2013-05-03 DIAGNOSIS — R5383 Other fatigue: Secondary | ICD-10-CM

## 2013-05-03 NOTE — Patient Instructions (Signed)
Your physician recommends that you schedule a follow-up appointment in: 3-4 months to see Dr. Sallyanne Kuster  Retain a primary care doctor and have  A testosterone level drawn if that doctor is in agreement

## 2013-05-05 ENCOUNTER — Encounter: Payer: Self-pay | Admitting: Cardiology

## 2013-05-05 DIAGNOSIS — E785 Hyperlipidemia, unspecified: Secondary | ICD-10-CM | POA: Insufficient documentation

## 2013-05-05 NOTE — Progress Notes (Signed)
Patient ID: Austin Davis, male   DOB: 06-17-1940, 73 y.o.   MRN: [id number]     05/05/2013 NEFTALY SWISS   [birth year]  [id number]  Primary Physician: None  Primary Cardiologist: Dr. Rex Kras  HPI:  Austin Davis is a 73 y/o male, previously followed by Dr. Rex Kras, with a history of mitral valve repair and Maze procedure by Dr. Prescott Gum in 2005. He had had severe mitral valve prolapse with severe mitral regurgitation and congestive heart failure prior to the repair.  He has not followed up since Dr. Rex Kras retired. He is not currently followed by a PCP. He has remained on Warfarin after his MVR and his INR is followed by our office. He presented to clinic 2 weeks ago with a complaint of recent increased DOE and fatigue. He often gets very tired after completing light household chores and yard work.  These are activities that were once easy for him. He denies any resting dyspnea. No orthopnea, PND or LEE. No exertional chest pain, palpitations, dizziness, syncope or near syncope. He also denies hematochezia and melena. At his last office visit, I ordered a 2D echo to reassess valve anatomy, assess LV function and wall motion. Since he does not have a PCP, I elected to obtain basic blood work to assess for other potential etiologies for fatigue.  I ordered a CBC to rule out anemia, a TSH to check thyroid function, a BMP to assess electrolytes and a Hgb A1c to check for diabetes. He also requested that I check his cholesterol as this hasn't been assessed in several years.   He presents to clinic today for follow-up and to review his test results. He is accompanied by hid girlfriend. His 2D echo demonstrated apical hypokinesis. The cavity size was normal. Wall thickness was normal. Systolic function was normal. The estimated ejection fraction was in the range of 50% to 55%. Aortic valve showed mild regurgitation. Review of mitral valve revealed a calcified annulus and mild regurgitation. His CBC, BMP and  TSH were all unremarkable. His Hgb A1c was slightly elevated at 6.1, suggesting that he is at an increased risk for developing diabetes mellitus. His lipid panel also returned abnormal with an LDL of 112 mg/dL.      Current Outpatient Prescriptions  Medication Sig Dispense Refill  . warfarin (COUMADIN) 5 MG tablet TAKE 1 TABLET (5 MG TOTAL) BY MOUTH DAILY.  40 tablet  3   No current facility-administered medications for this visit.    Allergies  Allergen Reactions  . Prednisone Other (See Comments)    Makes skin crawl    History   Social History  . Marital Status: Single    Spouse Name: N/A    Number of Children: N/A  . Years of Education: N/A   Occupational History  . Not on file.   Social History Main Topics  . Smoking status: Former Research scientist (life sciences)  . Smokeless tobacco: Not on file  . Alcohol Use: Not on file  . Drug Use: Not on file  . Sexual Activity: Not on file   Other Topics Concern  . Not on file   Social History Narrative  . No narrative on file     Review of Systems: General: negative for chills, fever, night sweats or weight changes.  Cardiovascular: negative for chest pain, dyspnea on exertion, edema, orthopnea, palpitations, paroxysmal nocturnal dyspnea or shortness of breath Dermatological: negative for rash Respiratory: negative for cough or wheezing Urologic: negative for hematuria Abdominal: negative for nausea,  vomiting, diarrhea, bright red blood per rectum, melena, or hematemesis Neurologic: negative for visual changes, syncope, or dizziness All other systems reviewed and are otherwise negative except as noted above.    Blood pressure 124/82, pulse 76, height 5\' 10"  (1.778 m), weight 163 lb 12.8 oz (74.299 kg).  General appearance: alert, cooperative and no distress Neck: no JVD and supple, symmetrical, trachea midline Lungs: clear to auscultation bilaterally Heart: regular rate and rhythm, S1, S2 normal, no murmur, click, rub or  gallop Extremities: no LEE Pulses: 2+ and symmetric Skin: warm and dry Neurologic: Grossly normal  EKG NSR; ventricular rate 78 bpm  ASSESSMENT AND PLAN:   Fatigue/ Dyspnea on Exertion Systolic function was normal on 2D echo making HF unlikely as an etiology for his DOE and fatigue. Wall motion was also normal ruling out the possibility of ischemia as a cause. He has only mild AI and MR, which I doubt is significant enough to cause symptoms. We have ruled out anemia and hypothyroidism, as both CBC and TSH were WNL. BMP also unremarkable. He is not diabetic, however Hgb A1c was slight abnormal. I suggested that he establish care with a PCP so that this can be followed. Low testosterone may also be a potential etiology, however I told him to have this assessed when he sees a PCP. I offered the patient a 2 week event monitor to assess for potential bradycardia and asymptomatic Afib. I informed him that slow heart rates and arrhthymias may also cause fatigue and SOB,  however he refused.   HLD (hyperlipidemia) LDL is elevated at 112. I recommended addition of a statin. However the patient refused. I advised that he try to lower levels with proper diet and exercise.     PLAN  Work-up for fatigue and DOE was fairly unremarkable. I suggested a 2 week event monitor to assess for bradycardias and arrhthymias, however he refused. No other w/u is needed at this time. He has been encouraged to establish care with a PCP. He states that he was suppose to establish care with Dr. Sallyanne Kuster after Dr. Rex Kras retired, but he failed to do so. I instructed him that we will need to continue to  follow his mitral valve. He was instructed to set up an appointment with Dr. Sallyanne Kuster in several months to establish routine care.    Champayne Kocian SimmonsPA-C 05/05/2013 4:05 PM

## 2013-05-05 NOTE — Assessment & Plan Note (Signed)
LDL is elevated at 112. I recommended addition of a statin. However the patient refused. I advised that he try to lower levels with proper diet and exercise.

## 2013-05-05 NOTE — Assessment & Plan Note (Addendum)
Systolic function was normal on 2D echo making HF unlikely as an etiology for his DOE and fatigue. Wall motion was also normal ruling out the possibility of ischemia as a cause. He has only mild AI and MR, which I doubt is significant enough to cause symptoms. We have ruled out anemia and hypothyroidism, as both CBC and TSH were WNL. BMP also unremarkable. He is not diabetic, however Hgb A1c was slight abnormal. I suggested that he establish care with a PCP so that this can be followed. Low testosterone may also be a potential etiology, however I told him to have this assessed when he sees a PCP. I offered the patient a 2 week event monitor to assess for potential bradycardia and asymptomatic Afib. I informed him that slow heart rates and arrhthymias may also cause fatigue and SOB,  however he refused.

## 2013-05-10 ENCOUNTER — Telehealth: Payer: Self-pay | Admitting: Cardiology

## 2013-05-10 NOTE — Telephone Encounter (Signed)
Message forwarded to Dr. Sallyanne Kuster to review and advise.  Pt was supposed to establish care w/ Dr. Sallyanne Kuster after Dr. Rex Kras retired and has not established w/ a new cardiologist.  Has only seen PA since.

## 2013-05-10 NOTE — Telephone Encounter (Signed)
Does he need to be premedicated before dental work?

## 2013-05-10 NOTE — Telephone Encounter (Signed)
Current guidelines no longer recommend antibiotics for dental procedures with repaired valves (only with replacements with a prosthesis). I am sorry I haven't seen him yet. Please schedule a follow up first available, but i don't think we need to overbook. Thanks

## 2013-05-11 ENCOUNTER — Telehealth: Payer: Self-pay | Admitting: Cardiovascular Disease

## 2013-05-11 NOTE — Telephone Encounter (Signed)
Returned call to [place] and informed her that abx not recommended for dental procedures w/ repaired valves.  Verbalized understanding and will inform hygienist.    Call to pt and left message to call back today before 4pm.  Will forward to [facility] in Scheduling to contact pt to set up [place].  PLEASE SEND TO SCHEDULING IF PT CALLS BACK.  NEEDS NEXT AVAILABLE W/ DR. Sallyanne Kuster.

## 2013-05-11 NOTE — Telephone Encounter (Signed)
Closed encounter °

## 2013-05-23 NOTE — Telephone Encounter (Signed)
Encounter Closed---05/23/13 TP 

## 2013-08-29 ENCOUNTER — Ambulatory Visit (INDEPENDENT_AMBULATORY_CARE_PROVIDER_SITE_OTHER): Payer: BC Managed Care – PPO | Admitting: Pharmacist Clinician (PhC)/ Clinical Pharmacy Specialist

## 2013-08-29 DIAGNOSIS — I4891 Unspecified atrial fibrillation: Secondary | ICD-10-CM

## 2013-08-29 DIAGNOSIS — Z7901 Long term (current) use of anticoagulants: Secondary | ICD-10-CM

## 2013-08-29 LAB — POCT INR: INR: 2.5

## 2013-09-07 ENCOUNTER — Other Ambulatory Visit: Payer: Self-pay | Admitting: Pharmacist Clinician (PhC)/ Clinical Pharmacy Specialist

## 2013-09-07 MED ORDER — WARFARIN SODIUM 5 MG PO TABS: ORAL_TABLET | ORAL | Status: DC

## 2013-10-12 ENCOUNTER — Ambulatory Visit (INDEPENDENT_AMBULATORY_CARE_PROVIDER_SITE_OTHER): Payer: BC Managed Care – PPO | Admitting: Pharmacist Clinician (PhC)/ Clinical Pharmacy Specialist

## 2013-10-12 DIAGNOSIS — Z7901 Long term (current) use of anticoagulants: Secondary | ICD-10-CM

## 2013-10-12 DIAGNOSIS — I4891 Unspecified atrial fibrillation: Secondary | ICD-10-CM

## 2013-10-12 LAB — POCT INR: INR: 3.7

## 2013-11-04 ENCOUNTER — Ambulatory Visit (INDEPENDENT_AMBULATORY_CARE_PROVIDER_SITE_OTHER): Payer: BC Managed Care – PPO | Admitting: Pharmacist Clinician (PhC)/ Clinical Pharmacy Specialist

## 2013-11-04 DIAGNOSIS — I4891 Unspecified atrial fibrillation: Secondary | ICD-10-CM

## 2013-11-04 DIAGNOSIS — Z7901 Long term (current) use of anticoagulants: Secondary | ICD-10-CM

## 2013-11-04 LAB — POCT INR: INR: 2

## 2013-11-28 ENCOUNTER — Other Ambulatory Visit: Payer: Self-pay | Admitting: Pharmacist Clinician (PhC)/ Clinical Pharmacy Specialist

## 2013-11-28 MED ORDER — WARFARIN SODIUM 5 MG PO TABS: ORAL_TABLET | ORAL | Status: DC

## 2013-12-14 ENCOUNTER — Ambulatory Visit (INDEPENDENT_AMBULATORY_CARE_PROVIDER_SITE_OTHER): Payer: BC Managed Care – PPO | Admitting: Pharmacist Clinician (PhC)/ Clinical Pharmacy Specialist

## 2013-12-14 DIAGNOSIS — I4891 Unspecified atrial fibrillation: Secondary | ICD-10-CM

## 2013-12-14 DIAGNOSIS — Z7901 Long term (current) use of anticoagulants: Secondary | ICD-10-CM

## 2013-12-14 LAB — POCT INR: INR: 2.3

## 2014-01-25 ENCOUNTER — Ambulatory Visit (INDEPENDENT_AMBULATORY_CARE_PROVIDER_SITE_OTHER): Payer: BC Managed Care – PPO | Admitting: Pharmacist Clinician (PhC)/ Clinical Pharmacy Specialist

## 2014-01-25 DIAGNOSIS — Z7901 Long term (current) use of anticoagulants: Secondary | ICD-10-CM

## 2014-01-25 DIAGNOSIS — I4891 Unspecified atrial fibrillation: Secondary | ICD-10-CM

## 2014-01-25 LAB — POCT INR: INR: 2.7

## 2014-01-30 ENCOUNTER — Other Ambulatory Visit: Payer: Self-pay | Admitting: Pharmacist Clinician (PhC)/ Clinical Pharmacy Specialist

## 2014-01-30 MED ORDER — WARFARIN SODIUM 5 MG PO TABS: ORAL_TABLET | ORAL | Status: DC

## 2014-03-10 ENCOUNTER — Ambulatory Visit: Payer: Self-pay | Admitting: Pharmacist Clinician (PhC)/ Clinical Pharmacy Specialist

## 2014-03-15 ENCOUNTER — Ambulatory Visit (INDEPENDENT_AMBULATORY_CARE_PROVIDER_SITE_OTHER): Payer: BLUE CROSS/BLUE SHIELD | Admitting: Pharmacist Clinician (PhC)/ Clinical Pharmacy Specialist

## 2014-03-15 DIAGNOSIS — Z7901 Long term (current) use of anticoagulants: Secondary | ICD-10-CM

## 2014-03-15 DIAGNOSIS — I4891 Unspecified atrial fibrillation: Secondary | ICD-10-CM

## 2014-03-15 LAB — POCT INR: INR: 2.8

## 2014-04-20 ENCOUNTER — Other Ambulatory Visit: Payer: Self-pay | Admitting: Internal Medicine

## 2014-04-26 ENCOUNTER — Ambulatory Visit (INDEPENDENT_AMBULATORY_CARE_PROVIDER_SITE_OTHER): Payer: BLUE CROSS/BLUE SHIELD | Admitting: Pharmacist Clinician (PhC)/ Clinical Pharmacy Specialist

## 2014-04-26 DIAGNOSIS — I4891 Unspecified atrial fibrillation: Secondary | ICD-10-CM | POA: Diagnosis not present

## 2014-04-26 DIAGNOSIS — Z7901 Long term (current) use of anticoagulants: Secondary | ICD-10-CM

## 2014-04-26 LAB — POCT INR: INR: 3.5

## 2014-05-17 ENCOUNTER — Ambulatory Visit (INDEPENDENT_AMBULATORY_CARE_PROVIDER_SITE_OTHER): Payer: BLUE CROSS/BLUE SHIELD | Admitting: Pharmacist Clinician (PhC)/ Clinical Pharmacy Specialist

## 2014-05-17 DIAGNOSIS — I4891 Unspecified atrial fibrillation: Secondary | ICD-10-CM | POA: Diagnosis not present

## 2014-05-17 LAB — POCT INR: INR: 2.5

## 2014-05-23 ENCOUNTER — Encounter: Payer: Self-pay | Admitting: *Deleted

## 2014-05-24 ENCOUNTER — Encounter: Payer: Self-pay | Admitting: *Deleted

## 2014-06-15 ENCOUNTER — Other Ambulatory Visit: Payer: Self-pay | Admitting: Internal Medicine

## 2014-06-28 ENCOUNTER — Ambulatory Visit (INDEPENDENT_AMBULATORY_CARE_PROVIDER_SITE_OTHER): Payer: BLUE CROSS/BLUE SHIELD | Admitting: Pharmacist Clinician (PhC)/ Clinical Pharmacy Specialist

## 2014-06-28 DIAGNOSIS — Z7901 Long term (current) use of anticoagulants: Secondary | ICD-10-CM | POA: Diagnosis not present

## 2014-06-28 DIAGNOSIS — I4891 Unspecified atrial fibrillation: Secondary | ICD-10-CM

## 2014-06-28 LAB — POCT INR: INR: 2.4

## 2014-08-09 ENCOUNTER — Ambulatory Visit: Payer: BLUE CROSS/BLUE SHIELD | Admitting: Pharmacist Clinician (PhC)/ Clinical Pharmacy Specialist

## 2014-08-12 ENCOUNTER — Other Ambulatory Visit: Payer: Self-pay | Admitting: Internal Medicine

## 2014-08-18 ENCOUNTER — Ambulatory Visit (INDEPENDENT_AMBULATORY_CARE_PROVIDER_SITE_OTHER): Payer: BLUE CROSS/BLUE SHIELD | Admitting: Pharmacist Clinician (PhC)/ Clinical Pharmacy Specialist

## 2014-08-18 DIAGNOSIS — I4891 Unspecified atrial fibrillation: Secondary | ICD-10-CM

## 2014-08-18 DIAGNOSIS — Z7901 Long term (current) use of anticoagulants: Secondary | ICD-10-CM

## 2014-08-18 LAB — POCT INR: INR: 3

## 2014-09-15 ENCOUNTER — Other Ambulatory Visit: Payer: Self-pay | Admitting: Internal Medicine

## 2014-09-29 ENCOUNTER — Ambulatory Visit: Payer: BLUE CROSS/BLUE SHIELD | Admitting: Pharmacist Clinician (PhC)/ Clinical Pharmacy Specialist

## 2014-10-05 ENCOUNTER — Ambulatory Visit (INDEPENDENT_AMBULATORY_CARE_PROVIDER_SITE_OTHER): Payer: BLUE CROSS/BLUE SHIELD | Admitting: Pharmacist Clinician (PhC)/ Clinical Pharmacy Specialist

## 2014-10-05 DIAGNOSIS — Z7901 Long term (current) use of anticoagulants: Secondary | ICD-10-CM | POA: Diagnosis not present

## 2014-10-05 DIAGNOSIS — I4891 Unspecified atrial fibrillation: Secondary | ICD-10-CM | POA: Diagnosis not present

## 2014-10-05 LAB — POCT INR: INR: 2.4

## 2014-10-16 ENCOUNTER — Other Ambulatory Visit: Payer: Self-pay | Admitting: Cardiovascular Disease

## 2014-11-15 ENCOUNTER — Ambulatory Visit: Payer: BLUE CROSS/BLUE SHIELD | Admitting: Pharmacist Clinician (PhC)/ Clinical Pharmacy Specialist

## 2014-11-21 ENCOUNTER — Ambulatory Visit (INDEPENDENT_AMBULATORY_CARE_PROVIDER_SITE_OTHER): Payer: BLUE CROSS/BLUE SHIELD | Admitting: Pharmacist Clinician (PhC)/ Clinical Pharmacy Specialist

## 2014-11-21 DIAGNOSIS — Z7901 Long term (current) use of anticoagulants: Secondary | ICD-10-CM | POA: Diagnosis not present

## 2014-11-21 DIAGNOSIS — I4891 Unspecified atrial fibrillation: Secondary | ICD-10-CM

## 2014-11-21 LAB — POCT INR: INR: 2.3

## 2014-11-30 ENCOUNTER — Other Ambulatory Visit: Payer: Self-pay | Admitting: Pharmacist

## 2014-11-30 MED ORDER — WARFARIN SODIUM 5 MG PO TABS: ORAL_TABLET | ORAL | Status: DC

## 2014-12-11 ENCOUNTER — Ambulatory Visit (INDEPENDENT_AMBULATORY_CARE_PROVIDER_SITE_OTHER): Payer: BLUE CROSS/BLUE SHIELD | Admitting: Cardiovascular Disease

## 2014-12-11 ENCOUNTER — Encounter: Payer: Self-pay | Admitting: Cardiovascular Disease

## 2014-12-11 VITALS — BP 124/80 | HR 84 | Resp 16 | Ht 68.0 in | Wt 164.0 lb

## 2014-12-11 DIAGNOSIS — E785 Hyperlipidemia, unspecified: Secondary | ICD-10-CM

## 2014-12-11 DIAGNOSIS — Z9889 Other specified postprocedural states: Secondary | ICD-10-CM | POA: Diagnosis not present

## 2014-12-11 DIAGNOSIS — Z79899 Other long term (current) drug therapy: Secondary | ICD-10-CM | POA: Diagnosis not present

## 2014-12-11 DIAGNOSIS — I480 Paroxysmal atrial fibrillation: Secondary | ICD-10-CM

## 2014-12-11 NOTE — Patient Instructions (Addendum)
Your physician recommends that you return for lab work in: fasting at your convenience.   Dr. Sallyanne Kuster recommends that you schedule a follow-up appointment in: one year  Your physician discussed the importance of regular exercise and recommended that you start or continue a regular exercise program 28mnutes at least 4 times a week for good health.

## 2014-12-11 NOTE — Progress Notes (Signed)
Patient ID: Austin Davis, male   DOB: 11/12/1940, 74 y.o.   MRN: [id number]     Cardiology Office Note   Date:  12/12/2014   ID:  Austin Davis, DOB October 16, 1940, MRN [id number]  PCP:  Laurel Dimmer, MD  Cardiologist:   Sanda Klein, MD   Chief Complaint  Patient presents with  . Annual Exam    Patient has no complaints.      History of Present Illness: Austin Davis is a 74 y.o. male who presents for f/u of valvular heart disease and paroxysmal atrial fibrillation. He previously saw Dr. Aldona Bar. He underwent mitral valve repair and Maze procedure by Dr. Prescott Gum in 2005 for mitral valve prolapse with severe mitral regurgitation and congestive heart failure.  In 2015, echo showed left ventricular ejection fraction of 50-55%, mild mitral insufficiency and a mildly dilated left atrium. There was a description of "apical hypokinesis" but he had normal coronary arteries by preoperative cardiac catheterization in 2005 and his electrocardiogram does not show any ischemic changes.  Note description of severe dilatation of the left atrium, 4+ mitral insufficiency and elevated PA pressure 62/32 at the time of cardiac catheterization in 2005.  In 2015 hemoglobin A1c was borderline at 6.1% and LDL cholesterol is 112. Relative management was recommended at that time.  Past Medical History  Diagnosis Date  . Paroxysmal atrial fibrillation (HCC)   . CHF (congestive heart failure) ([place])   . Coronary artery disease   . Mitral regurgitation   . Mitral valve prolapse     Past Surgical History  Procedure Laterality Date  . Mitral valve repair  01/2003  . Monitor  02/05/2006     Current Outpatient Prescriptions  Medication Sig Dispense Refill  . CHLORPHENIRAMINE MALEATE PO Take 4 mg by mouth as needed.    . GUAIFENESIN PO Take 400 mg by mouth as needed.    . warfarin (COUMADIN) 5 MG tablet TAKE 1 TO 1 & 1/2 TABLETS BY MOUTH EVERY DAY AS DIRECTED BY COUMADIN CLINIC 40  tablet 3   No current facility-administered medications for this visit.    Allergies:   Prednisone    Social History:  The patient  reports that he has quit smoking. He does not have any smokeless tobacco history on file. He reports that he drinks alcohol. He reports that he does not use illicit drugs.   Family History:  The patient's family history includes Heart failure in his father, maternal grandmother, and paternal grandmother; Pneumonia in his maternal grandfather; Stroke in his paternal grandfather.    ROS:  Please see the history of present illness.    Otherwise, review of systems positive for none.   All other systems are reviewed and negative.    PHYSICAL EXAM: VS:  BP 124/80 mmHg  Pulse 84  Ht '5\' 8"'$  (1.727 m)  Wt 164 lb (74.39 kg)  BMI 24.94 kg/m2 , BMI Body mass index is 24.94 kg/(m^2).  General: Alert, oriented x3, no distress Head: no evidence of trauma, PERRL, EOMI, no exophtalmos or lid lag, no myxedema, no xanthelasma; normal ears, nose and oropharynx Neck: normal jugular venous pulsations and no hepatojugular reflux; brisk carotid pulses without delay and no carotid bruits Chest: clear to auscultation, no signs of consolidation by percussion or palpation, normal fremitus, symmetrical and full respiratory excursions Cardiovascular: normal position and quality of the apical impulse, regular rhythm, normal first and second heart sounds, no murmurs, rubs or gallops Abdomen: no tenderness or distention,  no masses by palpation, no abnormal pulsatility or arterial bruits, normal bowel sounds, no hepatosplenomegaly Extremities: no clubbing, cyanosis or edema; 2+ radial, ulnar and brachial pulses bilaterally; 2+ right femoral, posterior tibial and dorsalis pedis pulses; 2+ left femoral, posterior tibial and dorsalis pedis pulses; no subclavian or femoral bruits Neurological: grossly nonfocal Psych: euthymic mood, full affect   EKG:  EKG is ordered today. The ekg ordered  today demonstrates normal sinus rhythm, normal tracing, QTC 441 ms   Recent Labs: No results found for requested labs within last 365 days.    Lipid Panel    Component Value Date/Time   CHOL 179 04/26/2013 0915   TRIG 84 04/26/2013 0915   HDL 50 04/26/2013 0915   CHOLHDL 3.6 04/26/2013 0915   VLDL 17 04/26/2013 0915   LDLCALC 112* 04/26/2013 0915      Wt Readings from Last 3 Encounters:  12/11/14 164 lb (74.39 kg)  05/03/13 163 lb 12.8 oz (74.299 kg)  04/18/13 167 lb (75.751 kg)     ASSESSMENT AND PLAN:  1. History of mitral valve repair for severe regurgitation due to mitral valve prolapse. No evidence of residual mitral insufficiency by physical exam. No clinical findings of congestive heart failure.  2. History of atrial fibrillation status post surgical maze without clinical recurrence of atrial fibrillation, remains on warfarin anticoagulation. No bleeding complications. Uncertain whether or not he still has subclinical episodes of atrial tachyarrhythmia. Unless bleeding occurs, it is not unreasonable to continue the warfarin.  3. Mild elevation in LDL cholesterol and hyperglycemia by labs a year ago. Repeat evaluation of hemoglobin A1c and lipid profile. There has been no change in his weight compared to a year ago. He is encouraged to remain clean and physically active. Note that the relative increase in his body mass index is due to the fact that his weight is unchanged, but he reportedly is 2 inches shorter this year compared to 2015.   Current medicines are reviewed at length with the patient today.  The patient does not have concerns regarding medicines.  The following changes have been made:  no change  Labs/ tests ordered today include:  Orders Placed This Encounter  Procedures  . Comprehensive metabolic panel  . Hemoglobin A1c  . Lipid panel  . EKG 12-Lead     Patient Instructions  Your physician recommends that you return for lab work in: fasting at  your convenience.   Dr. Sallyanne Kuster recommends that you schedule a follow-up appointment in: one year  Your physician discussed the importance of regular exercise and recommended that you start or continue a regular exercise program 21mnutes at least 4 times a week for good health.      SMikael Spray MD  12/12/2014 4:55 PM    MSanda Klein MD, FCent[facility] Center[facility] ([phone] [phone]

## 2014-12-12 ENCOUNTER — Encounter: Payer: Self-pay | Admitting: Cardiovascular Disease

## 2014-12-12 DIAGNOSIS — Z9889 Other specified postprocedural states: Secondary | ICD-10-CM | POA: Insufficient documentation

## 2015-01-03 ENCOUNTER — Ambulatory Visit (INDEPENDENT_AMBULATORY_CARE_PROVIDER_SITE_OTHER): Payer: BLUE CROSS/BLUE SHIELD | Admitting: Pharmacist Clinician (PhC)/ Clinical Pharmacy Specialist

## 2015-01-03 DIAGNOSIS — Z7901 Long term (current) use of anticoagulants: Secondary | ICD-10-CM

## 2015-01-03 DIAGNOSIS — I4891 Unspecified atrial fibrillation: Secondary | ICD-10-CM

## 2015-01-03 DIAGNOSIS — I480 Paroxysmal atrial fibrillation: Secondary | ICD-10-CM

## 2015-01-03 LAB — POCT INR: INR: 2.9

## 2015-01-31 LAB — COMPREHENSIVE METABOLIC PANEL
ALBUMIN: 4.5 g/dL (ref 3.6–5.1)
ALT: 25 U/L (ref 9–46)
AST: 25 U/L (ref 10–35)
Alkaline Phosphatase: 65 U/L (ref 40–115)
BUN: 17 mg/dL (ref 7–25)
CHLORIDE: 100 mmol/L (ref 98–110)
CO2: 30 mmol/L (ref 20–31)
CREATININE: 0.97 mg/dL (ref 0.70–1.18)
Calcium: 9.2 mg/dL (ref 8.6–10.3)
GLUCOSE: 96 mg/dL (ref 65–99)
Potassium: 4.4 mmol/L (ref 3.5–5.3)
SODIUM: 138 mmol/L (ref 135–146)
Total Bilirubin: 0.6 mg/dL (ref 0.2–1.2)
Total Protein: 6.6 g/dL (ref 6.1–8.1)

## 2015-01-31 LAB — HEMOGLOBIN A1C
Hgb A1c MFr Bld: 5.9 % — ABNORMAL HIGH (ref <5.7)
MEAN PLASMA GLUCOSE: 123 mg/dL — AB (ref <117)

## 2015-01-31 LAB — LIPID PANEL
CHOL/HDL RATIO: 3.3 ratio (ref <=5.0)
Cholesterol: 180 mg/dL (ref 125–200)
HDL: 55 mg/dL (ref >=40)
LDL Cholesterol: 110 mg/dL (ref <130)
Triglycerides: 75 mg/dL (ref <150)
VLDL: 15 mg/dL (ref <30)

## 2015-02-14 ENCOUNTER — Ambulatory Visit (INDEPENDENT_AMBULATORY_CARE_PROVIDER_SITE_OTHER): Payer: BLUE CROSS/BLUE SHIELD | Admitting: Pharmacist Clinician (PhC)/ Clinical Pharmacy Specialist

## 2015-02-14 DIAGNOSIS — I480 Paroxysmal atrial fibrillation: Secondary | ICD-10-CM

## 2015-02-14 DIAGNOSIS — I4891 Unspecified atrial fibrillation: Secondary | ICD-10-CM

## 2015-02-14 DIAGNOSIS — Z7901 Long term (current) use of anticoagulants: Secondary | ICD-10-CM

## 2015-02-14 LAB — POCT INR: INR: 2.4

## 2015-02-19 ENCOUNTER — Encounter: Payer: Self-pay | Admitting: Neurology

## 2015-02-19 ENCOUNTER — Ambulatory Visit (INDEPENDENT_AMBULATORY_CARE_PROVIDER_SITE_OTHER): Payer: BLUE CROSS/BLUE SHIELD | Admitting: Neurology

## 2015-02-19 VITALS — BP 131/83 | HR 80 | Ht 68.0 in | Wt 166.4 lb

## 2015-02-19 DIAGNOSIS — G250 Essential tremor: Secondary | ICD-10-CM | POA: Insufficient documentation

## 2015-02-19 MED ORDER — GABAPENTIN 300 MG PO CAPS: 300.0000 mg | ORAL_CAPSULE | Freq: Three times a day (TID) | ORAL | Status: DC | PRN

## 2015-02-19 NOTE — Progress Notes (Signed)
[facility]    Provider:  Dr Jaynee Eagles Referring Provider: Laurel Dimmer, MD Primary Care Physician:  Harle Battiest, MD  CC:  tremor  HPI:  Austin Davis is a 75 y.o. male here as a referral from Dr. Seth Bake for tremor. PMHx afib on Coumadin following valvular repair, tremor.   Started noticing tremor after heart surgery in 2005. Notices it more in the face and hands. Has also noticed it in the legs occ. Biggest problem is shaking in his hands, difficult to write and impairs his abilities at work, difficult to read his handwriting, also difficulty eating he needs to use a plastic fork to avoid chipping a tooth. He feels it in his face as well. The tremor varies. Stress makes it worse, very affected by stress and anxiety. Relaxation makes it better. He is in a stressful environment. Father may have had tremor. Unsure if alcohol makes it better. No caffeine or energy drinks. Slowly progressive tremor. No other focal neurologic deficits. Thyroid was tested. He also has cramping in his feet especially when on his feet all day.  Reviewed notes, labs and imaging from outside physicians, which showed: Per [facility] physician notes, thyroid was recently checked but I do not have the results. He noticed intermittent shaking after heart surgery, worsening voice is shaky mainly in the hands with difficulty writing. Patient was referred to neurology for this. He also reports muscle cramping.  Hemoglobin A1c in January 2017 5.9, CMP normal. TSH in April 2015 0.8 which is normal.  Review of Systems: Patient complains of symptoms per HPI as well as the following symptoms: fatigue, hearing loss, joint pain, cramps, allergies, headache, tremor, not enough sleep, too much sleep. Pertinent negatives per HPI. All others negative.   Social History   Social History  . Marital Status: Single    Spouse Name: N/A  . Number of Children: 2  . Years of Education: 16   Occupational History  . [employer]  home improvement    Social History Main Topics  . Smoking status: Former Research scientist (life sciences)  . Smokeless tobacco: Not on file  . Alcohol Use: Yes  . Drug Use: No  . Sexual Activity: Not on file   Other Topics Concern  . Not on file   Social History Narrative   Lives with girlfriend   Caffeine use: none    Family History  Problem Relation Age of Onset  . Heart failure Father   . Heart failure Maternal Grandmother   . Pneumonia Maternal Grandfather   . Heart failure Paternal Grandmother   . Stroke Paternal Grandfather     Past Medical History  Diagnosis Date  . Paroxysmal atrial fibrillation (HCC)   . CHF (congestive heart failure) ([place])   . Coronary artery disease   . Mitral regurgitation   . Mitral valve prolapse     Past Surgical History  Procedure Laterality Date  . Mitral valve repair  01/2003  . Monitor  02/05/2006    Current Outpatient Prescriptions  Medication Sig Dispense Refill  . CHLORPHENIRAMINE MALEATE PO Take 4 mg by mouth as needed.    . Cholecalciferol (VITAMIN D-3) 1000 units CAPS Take 2,000 Units by mouth daily.    . GUAIFENESIN PO Take 400 mg by mouth as needed.    Marland Kitchen omega-3 acid ethyl esters (LOVAZA) 1 g capsule Take 1 g by mouth 2 (two) times daily.    . Potassium Gluconate 550 MG TABS Take 1 tablet by mouth daily.    Marland Kitchen  vitamin C (ASCORBIC ACID) 500 MG tablet Take 500 mg by mouth daily.    Marland Kitchen warfarin (COUMADIN) 5 MG tablet TAKE 1 TO 1 & 1/2 TABLETS BY MOUTH EVERY DAY AS DIRECTED BY COUMADIN CLINIC 40 tablet 3  . gabapentin (NEURONTIN) 300 MG capsule Take 1 capsule (300 mg total) by mouth 3 (three) times daily as needed. 90 capsule 11   No current facility-administered medications for this visit.    Allergies as of 02/19/2015 - Review Complete 02/19/2015  Allergen Reaction Noted  . Prednisone Other (See Comments) 05/03/2013    Vitals: BP 131/83 mmHg  Pulse 80  Ht '5\' 8"'$  (1.727 m)  Wt 166 lb 6.4 oz (75.479 kg)  BMI 25.31 kg/m2 Last Weight:  Wt  Readings from Last 1 Encounters:  02/19/15 166 lb 6.4 oz (75.479 kg)   Last Height:   Ht Readings from Last 1 Encounters:  02/19/15 '5\' 8"'$  (1.727 m)   Physical exam: Exam: Gen: Anxious                    CV: RRR, no MRG. No Carotid Bruits. No peripheral edema, warm, nontender Eyes: Conjunctivae clear without exudates or hemorrhage  Neuro: Detailed Neurologic Exam  Speech:    Speech is normal; fluent and spontaneous with normal comprehension.  Cognition:    The patient is oriented to person, place, and time;     recent and remote memory intact;     language fluent;     normal attention, concentration,     fund of knowledge Cranial Nerves:    The pupils are equal, round, and reactive to light. The fundi are flat. Visual fields are full to finger confrontation. Extraocular movements are intact. Trigeminal sensation is intact and the muscles of mastication are normal. The face is symmetric. The palate elevates in the midline. Hearing intact. Voice is normal. Shoulder shrug is normal. The tongue has normal motion without fasciculations.   Coordination:    Normal finger to nose and heel to shin.  Gait:    Heel-toe gait are normal.   Motor Observation:    High frequency low amp postural tremor head and UE tremor with kinetic component. No resting tremor. Tone:    Normal muscle tone.    Posture:    Posture is normal. normal erect    Strength:    Strength is V/V in the upper and lower limbs.      Sensation: intact to LT     Reflex Exam:  DTR's: Hyporeflexic AJs otherwise deep tendon reflexes in the upper and lower extremities are normal bilaterally.   Toes:    The toes are downgoing bilaterally.   Clonus:    Clonus is absent.       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 Can't rule out physiologic tremor worsened by severe anxiety as he describes a significant amount of stress and anxiety associated with the tremor. He is very  concerned with medication side effects. He wants something he can take only as needed.   Recommend MRI of the brain, he declines Will try neurontin prn. Could also try propranolol 10 mg 3 times a day as needed Neurontin does not help. Discussed side effects including dizziness, drowsiness, weakness, tired feeling, nausea, diarrhea, constipation, blurred vision, headache, breast swelling, dry mouth, loss of balance or coordination. Serious side effects can include depression, suicidality, rash, anaphylaxis, angioedema. Stop for anything concerning. Can slowly increase up to 1200 mg  3 times a day prn. Do not take medication and drive until he how it affects you.    Sarina Ill, MD  [facility] [address] [place], [place] [address]  Phone [phone] Fax [phone]

## 2015-02-19 NOTE — Patient Instructions (Signed)
Remember to drink plenty of fluid, eat healthy meals and do not skip any meals. Try to eat protein with a every meal and eat a healthy snack such as fruit or nuts in between meals. Try to keep a regular sleep-wake schedule and try to exercise daily, particularly in the form of walking, 20-30 minutes a day, if you can.   As far as your medications are concerned, I would like to suggest: Neurontin(Gabapentin) up to three times a day  As far as diagnostic testing: Recommend MRI of the brain  I would like to see you back as needed, sooner if we need to. Please call us with any interim questions, concerns, problems, updates or refill requests.   Our phone number is [phone]. We also have an after hours call service for urgent matters and there is a physician on-call for urgent questions. For any emergencies you know to call 911 or go to the nearest emergency room

## 2015-03-27 ENCOUNTER — Ambulatory Visit (INDEPENDENT_AMBULATORY_CARE_PROVIDER_SITE_OTHER): Payer: BLUE CROSS/BLUE SHIELD | Admitting: Pharmacist Clinician (PhC)/ Clinical Pharmacy Specialist

## 2015-03-27 DIAGNOSIS — I4891 Unspecified atrial fibrillation: Secondary | ICD-10-CM | POA: Diagnosis not present

## 2015-03-27 DIAGNOSIS — I480 Paroxysmal atrial fibrillation: Secondary | ICD-10-CM | POA: Diagnosis not present

## 2015-03-27 DIAGNOSIS — Z7901 Long term (current) use of anticoagulants: Secondary | ICD-10-CM

## 2015-03-27 LAB — POCT INR: INR: 2.9

## 2015-05-07 ENCOUNTER — Ambulatory Visit (INDEPENDENT_AMBULATORY_CARE_PROVIDER_SITE_OTHER): Payer: Medicare Other | Admitting: Pharmacist Clinician (PhC)/ Clinical Pharmacy Specialist

## 2015-05-07 DIAGNOSIS — I480 Paroxysmal atrial fibrillation: Secondary | ICD-10-CM | POA: Diagnosis not present

## 2015-05-07 DIAGNOSIS — Z7901 Long term (current) use of anticoagulants: Secondary | ICD-10-CM | POA: Diagnosis not present

## 2015-05-07 DIAGNOSIS — I4891 Unspecified atrial fibrillation: Secondary | ICD-10-CM | POA: Diagnosis not present

## 2015-05-07 LAB — POCT INR: INR: 3

## 2015-05-22 DIAGNOSIS — H2513 Age-related nuclear cataract, bilateral: Secondary | ICD-10-CM | POA: Diagnosis not present

## 2015-05-22 DIAGNOSIS — H10413 Chronic giant papillary conjunctivitis, bilateral: Secondary | ICD-10-CM | POA: Diagnosis not present

## 2015-05-22 DIAGNOSIS — H04123 Dry eye syndrome of bilateral lacrimal glands: Secondary | ICD-10-CM | POA: Diagnosis not present

## 2015-05-22 DIAGNOSIS — H31093 Other chorioretinal scars, bilateral: Secondary | ICD-10-CM | POA: Diagnosis not present

## 2015-06-19 ENCOUNTER — Ambulatory Visit (INDEPENDENT_AMBULATORY_CARE_PROVIDER_SITE_OTHER): Payer: Medicare Other | Admitting: Pharmacist Clinician (PhC)/ Clinical Pharmacy Specialist

## 2015-06-19 DIAGNOSIS — I480 Paroxysmal atrial fibrillation: Secondary | ICD-10-CM

## 2015-06-19 DIAGNOSIS — Z7901 Long term (current) use of anticoagulants: Secondary | ICD-10-CM

## 2015-06-19 DIAGNOSIS — I4891 Unspecified atrial fibrillation: Secondary | ICD-10-CM | POA: Diagnosis not present

## 2015-06-19 LAB — POCT INR: INR: 2.7

## 2015-07-15 ENCOUNTER — Other Ambulatory Visit: Payer: Self-pay | Admitting: Cardiovascular Disease

## 2015-07-30 ENCOUNTER — Ambulatory Visit (INDEPENDENT_AMBULATORY_CARE_PROVIDER_SITE_OTHER): Payer: Medicare Other | Admitting: Pharmacist Clinician (PhC)/ Clinical Pharmacy Specialist

## 2015-07-30 DIAGNOSIS — I480 Paroxysmal atrial fibrillation: Secondary | ICD-10-CM | POA: Diagnosis not present

## 2015-07-30 DIAGNOSIS — Z7901 Long term (current) use of anticoagulants: Secondary | ICD-10-CM

## 2015-07-30 DIAGNOSIS — I4891 Unspecified atrial fibrillation: Secondary | ICD-10-CM

## 2015-07-30 LAB — POCT INR: INR: 2.8

## 2015-09-10 ENCOUNTER — Ambulatory Visit (INDEPENDENT_AMBULATORY_CARE_PROVIDER_SITE_OTHER): Payer: Medicare Other | Admitting: Pharmacist

## 2015-09-10 DIAGNOSIS — I480 Paroxysmal atrial fibrillation: Secondary | ICD-10-CM

## 2015-09-10 DIAGNOSIS — I4891 Unspecified atrial fibrillation: Secondary | ICD-10-CM | POA: Diagnosis not present

## 2015-09-10 DIAGNOSIS — Z7901 Long term (current) use of anticoagulants: Secondary | ICD-10-CM | POA: Diagnosis not present

## 2015-09-10 LAB — POCT INR: INR: 2.2

## 2015-10-08 DIAGNOSIS — Z131 Encounter for screening for diabetes mellitus: Secondary | ICD-10-CM | POA: Diagnosis not present

## 2015-10-08 DIAGNOSIS — Z23 Encounter for immunization: Secondary | ICD-10-CM | POA: Diagnosis not present

## 2015-10-08 DIAGNOSIS — Z0000 Encounter for general adult medical examination without abnormal findings: Secondary | ICD-10-CM | POA: Diagnosis not present

## 2015-10-08 DIAGNOSIS — R0981 Nasal congestion: Secondary | ICD-10-CM | POA: Diagnosis not present

## 2015-10-08 DIAGNOSIS — Z125 Encounter for screening for malignant neoplasm of prostate: Secondary | ICD-10-CM | POA: Diagnosis not present

## 2015-10-26 ENCOUNTER — Ambulatory Visit (INDEPENDENT_AMBULATORY_CARE_PROVIDER_SITE_OTHER): Payer: Medicare Other | Admitting: Pharmacist

## 2015-10-26 DIAGNOSIS — Z7901 Long term (current) use of anticoagulants: Secondary | ICD-10-CM | POA: Diagnosis not present

## 2015-10-26 DIAGNOSIS — I4891 Unspecified atrial fibrillation: Secondary | ICD-10-CM | POA: Diagnosis not present

## 2015-10-26 LAB — POCT INR: INR: 2

## 2015-11-07 DIAGNOSIS — D2271 Melanocytic nevi of right lower limb, including hip: Secondary | ICD-10-CM | POA: Diagnosis not present

## 2015-11-07 DIAGNOSIS — D2262 Melanocytic nevi of left upper limb, including shoulder: Secondary | ICD-10-CM | POA: Diagnosis not present

## 2015-11-07 DIAGNOSIS — X32XXXA Exposure to sunlight, initial encounter: Secondary | ICD-10-CM | POA: Diagnosis not present

## 2015-11-07 DIAGNOSIS — D485 Neoplasm of uncertain behavior of skin: Secondary | ICD-10-CM | POA: Diagnosis not present

## 2015-11-07 DIAGNOSIS — L821 Other seborrheic keratosis: Secondary | ICD-10-CM | POA: Diagnosis not present

## 2015-11-07 DIAGNOSIS — L570 Actinic keratosis: Secondary | ICD-10-CM | POA: Diagnosis not present

## 2015-11-07 DIAGNOSIS — D225 Melanocytic nevi of trunk: Secondary | ICD-10-CM | POA: Diagnosis not present

## 2015-11-21 ENCOUNTER — Other Ambulatory Visit: Payer: Self-pay | Admitting: Cardiovascular Disease

## 2015-12-07 ENCOUNTER — Ambulatory Visit (INDEPENDENT_AMBULATORY_CARE_PROVIDER_SITE_OTHER): Payer: Medicare Other | Admitting: Pharmacist

## 2015-12-07 DIAGNOSIS — I4891 Unspecified atrial fibrillation: Secondary | ICD-10-CM | POA: Diagnosis not present

## 2015-12-07 DIAGNOSIS — Z7901 Long term (current) use of anticoagulants: Secondary | ICD-10-CM | POA: Diagnosis not present

## 2015-12-07 LAB — POCT INR: INR: 2.6

## 2016-01-18 ENCOUNTER — Ambulatory Visit (INDEPENDENT_AMBULATORY_CARE_PROVIDER_SITE_OTHER): Payer: Medicare Other | Admitting: Pharmacist

## 2016-01-18 DIAGNOSIS — I4891 Unspecified atrial fibrillation: Secondary | ICD-10-CM

## 2016-01-18 DIAGNOSIS — Z7901 Long term (current) use of anticoagulants: Secondary | ICD-10-CM | POA: Diagnosis not present

## 2016-01-18 LAB — POCT INR: INR: 2.1

## 2016-01-26 ENCOUNTER — Other Ambulatory Visit: Payer: Self-pay | Admitting: Cardiovascular Disease

## 2016-02-29 ENCOUNTER — Ambulatory Visit (INDEPENDENT_AMBULATORY_CARE_PROVIDER_SITE_OTHER): Payer: Medicare Other | Admitting: Pharmacist

## 2016-02-29 DIAGNOSIS — Z7901 Long term (current) use of anticoagulants: Secondary | ICD-10-CM | POA: Diagnosis not present

## 2016-02-29 DIAGNOSIS — I4891 Unspecified atrial fibrillation: Secondary | ICD-10-CM | POA: Diagnosis not present

## 2016-02-29 LAB — POCT INR: INR: 2.4

## 2016-04-11 ENCOUNTER — Ambulatory Visit (INDEPENDENT_AMBULATORY_CARE_PROVIDER_SITE_OTHER): Payer: Medicare Other | Admitting: Pharmacist Clinician (PhC)/ Clinical Pharmacy Specialist

## 2016-04-11 DIAGNOSIS — Z7901 Long term (current) use of anticoagulants: Secondary | ICD-10-CM | POA: Diagnosis not present

## 2016-04-11 DIAGNOSIS — I4891 Unspecified atrial fibrillation: Secondary | ICD-10-CM

## 2016-04-11 LAB — POCT INR: INR: 2.9

## 2016-04-23 DIAGNOSIS — W57XXXA Bitten or stung by nonvenomous insect and other nonvenomous arthropods, initial encounter: Secondary | ICD-10-CM | POA: Diagnosis not present

## 2016-04-23 DIAGNOSIS — R251 Tremor, unspecified: Secondary | ICD-10-CM | POA: Diagnosis not present

## 2016-04-23 DIAGNOSIS — S30863A Insect bite (nonvenomous) of scrotum and testes, initial encounter: Secondary | ICD-10-CM | POA: Diagnosis not present

## 2016-05-23 ENCOUNTER — Ambulatory Visit (INDEPENDENT_AMBULATORY_CARE_PROVIDER_SITE_OTHER): Payer: Medicare Other | Admitting: Pharmacist

## 2016-05-23 DIAGNOSIS — I4891 Unspecified atrial fibrillation: Secondary | ICD-10-CM | POA: Diagnosis not present

## 2016-05-23 DIAGNOSIS — Z7901 Long term (current) use of anticoagulants: Secondary | ICD-10-CM | POA: Diagnosis not present

## 2016-05-23 LAB — POCT INR: INR: 2.6

## 2016-06-14 ENCOUNTER — Other Ambulatory Visit: Payer: Self-pay | Admitting: Cardiovascular Disease

## 2016-07-11 ENCOUNTER — Ambulatory Visit (INDEPENDENT_AMBULATORY_CARE_PROVIDER_SITE_OTHER): Payer: Medicare Other | Admitting: Pharmacist Clinician (PhC)/ Clinical Pharmacy Specialist

## 2016-07-11 DIAGNOSIS — I4891 Unspecified atrial fibrillation: Secondary | ICD-10-CM | POA: Diagnosis not present

## 2016-07-11 DIAGNOSIS — Z7901 Long term (current) use of anticoagulants: Secondary | ICD-10-CM

## 2016-07-11 LAB — POCT INR: INR: 2.2

## 2016-08-29 ENCOUNTER — Ambulatory Visit (INDEPENDENT_AMBULATORY_CARE_PROVIDER_SITE_OTHER): Payer: Medicare Other | Admitting: Pharmacist

## 2016-08-29 DIAGNOSIS — Z7901 Long term (current) use of anticoagulants: Secondary | ICD-10-CM

## 2016-08-29 LAB — POCT INR: INR: 3

## 2016-09-10 ENCOUNTER — Other Ambulatory Visit: Payer: Self-pay | Admitting: Cardiovascular Disease

## 2016-10-09 DIAGNOSIS — H31093 Other chorioretinal scars, bilateral: Secondary | ICD-10-CM | POA: Diagnosis not present

## 2016-10-09 DIAGNOSIS — H2513 Age-related nuclear cataract, bilateral: Secondary | ICD-10-CM | POA: Diagnosis not present

## 2016-10-09 DIAGNOSIS — H10413 Chronic giant papillary conjunctivitis, bilateral: Secondary | ICD-10-CM | POA: Diagnosis not present

## 2016-10-09 DIAGNOSIS — H04123 Dry eye syndrome of bilateral lacrimal glands: Secondary | ICD-10-CM | POA: Diagnosis not present

## 2016-10-10 ENCOUNTER — Encounter: Payer: Self-pay | Admitting: Cardiovascular Disease

## 2016-10-10 ENCOUNTER — Ambulatory Visit (INDEPENDENT_AMBULATORY_CARE_PROVIDER_SITE_OTHER): Payer: Medicare Other | Admitting: Pharmacist

## 2016-10-10 ENCOUNTER — Ambulatory Visit (INDEPENDENT_AMBULATORY_CARE_PROVIDER_SITE_OTHER): Payer: Medicare Other | Admitting: Cardiovascular Disease

## 2016-10-10 VITALS — BP 120/88 | HR 88 | Ht 68.0 in | Wt 171.0 lb

## 2016-10-10 DIAGNOSIS — Z7901 Long term (current) use of anticoagulants: Secondary | ICD-10-CM

## 2016-10-10 DIAGNOSIS — R0989 Other specified symptoms and signs involving the circulatory and respiratory systems: Secondary | ICD-10-CM | POA: Diagnosis not present

## 2016-10-10 DIAGNOSIS — Z9889 Other specified postprocedural states: Secondary | ICD-10-CM | POA: Diagnosis not present

## 2016-10-10 DIAGNOSIS — I8393 Asymptomatic varicose veins of bilateral lower extremities: Secondary | ICD-10-CM | POA: Diagnosis not present

## 2016-10-10 DIAGNOSIS — I480 Paroxysmal atrial fibrillation: Secondary | ICD-10-CM

## 2016-10-10 DIAGNOSIS — G250 Essential tremor: Secondary | ICD-10-CM

## 2016-10-10 DIAGNOSIS — I4891 Unspecified atrial fibrillation: Secondary | ICD-10-CM

## 2016-10-10 LAB — POCT INR: INR: 4.9

## 2016-10-10 NOTE — Patient Instructions (Signed)
Dr Sallyanne Kuster recommends that you continue on your current medications as directed. Please refer to the Current Medication list given to you today.  Your physician has requested that you have a lower extremity arterial duplex. This test is an ultrasound of the arteries in the legs. It looks at arterial blood flow in the legs. Allow one hour for Lower Arterial scans. There are no restrictions or special instructions.  Dr Sallyanne Kuster recommends that you schedule a follow-up appointment in 12 months. You will receive a reminder letter in the mail two months in advance. If you don't receive a letter, please call our office to schedule the follow-up appointment.  If you need a refill on your cardiac medications before your next appointment, please call your pharmacy.

## 2016-10-10 NOTE — Progress Notes (Signed)
Patient ID: Austin Davis, male   DOB: [birth year], 76 y.o.   MRN: [id number]     Cardiology Office Note   Date:  10/12/2016   ID:  Austin Davis, DOB February 04, 1940, MRN [id number]  PCP:  [name], MD  Cardiologist:   Sanda Klein, MD   Chief Complaint  Patient presents with  . Follow-up    swelling in ankles      History of Present Illness: Austin Davis is a 76 y.o. male who presents for f/u of valvular heart disease and paroxysmal atrial fibrillation. He underwent mitral valve repair and Maze procedure by Dr. Prescott Gum in 2005 for mitral valve prolapse with severe mitral regurgitation and congestive heart failure.  Prior to surgery in 2005, he had a severely.the left atrium, 4+ mitral insufficiency and moderate pulmonary hypertension (PA  62/32). In 2015, echo showed left ventricular ejection fraction of 50-55%, mild mitral insufficiency and a mildly dilated left atrium. There was a description of "apical hypokinesis" but he had normal coronary arteries by preoperative cardiac catheterization, has not had angina and had normal ECG at that time of that report.  He denies any cardiovascular complaints. The patient specifically denies any chest pain at rest exertion, dyspnea at rest or with exertion, orthopnea, paroxysmal nocturnal dyspnea, syncope, palpitations, focal neurological deficits, intermittent claudication, lower extremity edema, unexplained weight gain, cough, hemoptysis or wheezing.  He has had well controlled anticoagulation on warfarin and denies any bleeding complications.  His biggest worry is about discoloration in his lower extremities. He has brownish hyperpigmentation in a pattern consistent with stasis dermatitis. He reports that he has mild edema if he stands for prolonged periods of time, but this resolves if he walks or if he lays in bed. He does have varicose veins.  She also has developed essential tremor and saw Dr. Jaynee Eagles. She recommended a  trial of gabapentin, off her propranolol as an alternative. Austin Davis has not tried it yet since he is afraid of potential side effects  Past Medical History:  Diagnosis Date  . CHF (congestive heart failure) ([place])   . Coronary artery disease   . Mitral regurgitation   . Mitral valve prolapse   . Paroxysmal atrial fibrillation [facility])     Past Surgical History:  Procedure Laterality Date  . MITRAL VALVE REPAIR  01/2003  . monitor  02/05/2006     Current Outpatient Prescriptions  Medication Sig Dispense Refill  . CHLORPHENIRAMINE MALEATE PO Take 4 mg by mouth as needed.    . gabapentin (NEURONTIN) 300 MG capsule Take 1 capsule (300 mg total) by mouth 3 (three) times daily as needed. 90 capsule 11  . GARLIC PO Take 1 capsule by mouth daily.    . GUAIFENESIN PO Take 400 mg by mouth as needed.    . Multiple Vitamin (MULTIVITAMIN) capsule Take 1 capsule by mouth daily.    Marland Kitchen omega-3 acid ethyl esters (LOVAZA) 1 g capsule Take 1 g by mouth 2 (two) times daily.    . Potassium Gluconate 550 MG TABS Take 1 tablet by mouth daily.    Marland Kitchen warfarin (COUMADIN) 5 MG tablet TAKE 1 TO 1 & 1/2 TABLETS BY MOUTH EVERY DAY AS DIRECTED BY COUMADIN CLINIC 40 tablet 5   No current facility-administered medications for this visit.     Allergies:   Prednisone    Social History:  The patient  reports that he has quit smoking. He does not have any smokeless tobacco history on  file. He reports that he drinks alcohol. He reports that he does not use drugs.   Family History:  The patient's family history includes Heart failure in his father, maternal grandmother, and paternal grandmother; Pneumonia in his maternal grandfather; Stroke in his paternal grandfather.    ROS:  Please see the history of present illness.    Otherwise, review of systems positive for none.   All other systems are reviewed and negative.    PHYSICAL EXAM: VS:  BP 120/88   Pulse 88   Ht 5\' 8"  (1.727 m)   Wt 171 lb (77.6 kg)   BMI  26.00 kg/m  , BMI Body mass index is 26 kg/m.   General: Alert, oriented x3, no distress, Head: no evidence of trauma, PERRL, EOMI, no exophtalmos or lid lag, no myxedema, no xanthelasma; normal ears, nose and oropharynx Neck: normal jugular venous pulsations and no hepatojugular reflux; brisk carotid pulses without delay and no carotid bruits Chest: clear to auscultation, no signs of consolidation by percussion or palpation, normal fremitus, symmetrical and full respiratory excursions Cardiovascular: normal position and quality of the apical impulse, regular rhythm, normal first and second heart sounds, no murmurs, rubs or gallops Abdomen: no tenderness or distention, no masses by palpation, no abnormal pulsatility or arterial bruits, normal bowel sounds, no hepatosplenomegaly Extremities: no clubbing, cyanosis or edema; Moderate hyperpigmentation of the calves bilaterally; bilateral moderate varicose veins; 2+ radial, ulnar and brachial pulses bilaterally; 2+ right femoral, 1+ posterior tibial and dorsalis pedis pulses; 2+ left femoral, 1+ posterior tibial and dorsalis pedis pulses; no subclavian or femoral bruits Neurological: grossly nonfocal, mild resting and intentional tremor of the hands Psych: Normal mood and affect    EKG:  EKG is ordered today. The ekg ordered today demonstrates sinus rhythm, normal ECG, QTC 447 ms   Recent Labs: No results found for requested labs within last 8760 hours.    Lipid Panel    Component Value Date/Time   CHOL 180 01/30/2015 1115   TRIG 75 01/30/2015 1115   HDL 55 01/30/2015 1115   CHOLHDL 3.3 01/30/2015 1115   VLDL 15 01/30/2015 1115   LDLCALC 110 01/30/2015 1115      Wt Readings from Last 3 Encounters:  10/10/16 171 lb (77.6 kg)  02/19/15 166 lb 6.4 oz (75.5 kg)  12/11/14 164 lb (74.4 kg)     ASSESSMENT AND PLAN:  1. Afib:  status post surgical maze without clinical recurrence of atrial fibrillation, remains on warfarin  anticoagulation. Atrial fibrillation has not been documented in many many years, but he has also not had any serious bleeding complications on warfarin. 2. S/P MV repair: History of mitral valve repair for severe regurgitation due to mitral valve prolapse in 2005. He does not have any symptoms of heart failure. His physical exam does not show any evidence of recurrence of mitral insufficiency. Last echo performed in 2015. Offered follow-up echo. Discussed the fact that with successful mitral valve repair his cardiovascular prognosis is excellent, comparable to patients without cardiac illness.. 3. Essential tremor: I told him that both feet gabapentin and propranolol are well tolerated, low side effect profile medications and encouraged him to try them. He says he will do so if his symptoms worsen. 4. Varicose veins: Recommended compression stockings, keep legs elevated while sitting. Does not have any evidence of stasis ulcers or other serious complications.  5. Diminished pulses: I don't think the symptoms he complains of having to do with arterial insufficiency, but he does  have diminished pulses in both feet. Will check ABI. He has a minimally elevated LDL cholesterol and no history of atherosclerotic disease.   Current medicines are reviewed at length with the patient today.  The patient does not have concerns regarding medicines.  The following changes have been made:  no change  Labs/ tests ordered today include:   Orders Placed This Encounter  Procedures  . Compression stockings     Patient Instructions  Dr Sallyanne Kuster recommends that you continue on your current medications as directed. Please refer to the Current Medication list given to you today.  Your physician has requested that you have a lower extremity arterial duplex. This test is an ultrasound of the arteries in the legs. It looks at arterial blood flow in the legs. Allow one hour for Lower Arterial scans. There are no  restrictions or special instructions.  Dr Sallyanne Kuster recommends that you schedule a follow-up appointment in 12 months. You will receive a reminder letter in the mail two months in advance. If you don't receive a letter, please call our office to schedule the follow-up appointment.  If you need a refill on your cardiac medications before your next appointment, please call your pharmacy.  Signed, Sanda Klein, MD  10/12/2016 2:10 PM    Sanda Klein, MD, [facility] [phone] office [phone] pager

## 2016-10-12 ENCOUNTER — Encounter: Payer: Self-pay | Admitting: Cardiovascular Disease

## 2016-10-12 DIAGNOSIS — I8393 Asymptomatic varicose veins of bilateral lower extremities: Secondary | ICD-10-CM | POA: Insufficient documentation

## 2016-10-14 NOTE — Addendum Note (Signed)
Addended by: Milderd Meager on: 10/14/2016 12:09 PM   Modules accepted: Orders

## 2016-10-20 ENCOUNTER — Other Ambulatory Visit: Payer: Self-pay | Admitting: Cardiovascular Disease

## 2016-10-20 DIAGNOSIS — R0989 Other specified symptoms and signs involving the circulatory and respiratory systems: Secondary | ICD-10-CM

## 2016-10-22 ENCOUNTER — Ambulatory Visit (HOSPITAL_COMMUNITY)
Admission: RE | Admit: 2016-10-22 | Discharge: 2016-10-22 | Disposition: A | Discharge status: Home / Self Care | Payer: Medicare Other | Source: Ambulatory Visit | Attending: Cardiovascular Disease | Admitting: Cardiovascular Disease

## 2016-10-22 ENCOUNTER — Ambulatory Visit (INDEPENDENT_AMBULATORY_CARE_PROVIDER_SITE_OTHER): Payer: Medicare Other | Admitting: Pharmacist

## 2016-10-22 DIAGNOSIS — Z87891 Personal history of nicotine dependence: Secondary | ICD-10-CM | POA: Diagnosis not present

## 2016-10-22 DIAGNOSIS — Z7901 Long term (current) use of anticoagulants: Secondary | ICD-10-CM | POA: Diagnosis not present

## 2016-10-22 DIAGNOSIS — R0989 Other specified symptoms and signs involving the circulatory and respiratory systems: Secondary | ICD-10-CM | POA: Diagnosis not present

## 2016-10-22 DIAGNOSIS — I480 Paroxysmal atrial fibrillation: Secondary | ICD-10-CM

## 2016-10-22 LAB — POCT INR: INR: 2.6

## 2016-10-23 DIAGNOSIS — Z23 Encounter for immunization: Secondary | ICD-10-CM | POA: Diagnosis not present

## 2016-10-23 DIAGNOSIS — Z131 Encounter for screening for diabetes mellitus: Secondary | ICD-10-CM | POA: Diagnosis not present

## 2016-10-23 DIAGNOSIS — R05 Cough: Secondary | ICD-10-CM | POA: Diagnosis not present

## 2016-10-23 DIAGNOSIS — Z0000 Encounter for general adult medical examination without abnormal findings: Secondary | ICD-10-CM | POA: Diagnosis not present

## 2016-10-23 DIAGNOSIS — I480 Paroxysmal atrial fibrillation: Secondary | ICD-10-CM | POA: Diagnosis not present

## 2016-10-23 DIAGNOSIS — Z1389 Encounter for screening for other disorder: Secondary | ICD-10-CM | POA: Diagnosis not present

## 2016-10-30 ENCOUNTER — Telehealth: Payer: Self-pay | Admitting: Cardiovascular Disease

## 2016-10-30 NOTE — Telephone Encounter (Signed)
Pt says he is returning a call from about 2 days ago,concerning his doppler results. He did not know who called him.

## 2016-10-30 NOTE — Telephone Encounter (Signed)
Patient made aware results and verbalized his understanding.  Notes recorded by Sanda Klein, MD on 10/22/2016 at 6:05 PM EDT Vessels are stiff and calcified, but there are no serious blockages.

## 2016-12-05 ENCOUNTER — Ambulatory Visit (INDEPENDENT_AMBULATORY_CARE_PROVIDER_SITE_OTHER): Payer: Medicare Other | Admitting: Pharmacist

## 2016-12-05 DIAGNOSIS — Z7901 Long term (current) use of anticoagulants: Secondary | ICD-10-CM

## 2016-12-05 DIAGNOSIS — I480 Paroxysmal atrial fibrillation: Secondary | ICD-10-CM | POA: Diagnosis not present

## 2016-12-05 LAB — POCT INR: INR: 3.1

## 2017-01-19 ENCOUNTER — Ambulatory Visit (INDEPENDENT_AMBULATORY_CARE_PROVIDER_SITE_OTHER): Payer: Medicare Other | Admitting: Pharmacist

## 2017-01-19 DIAGNOSIS — I480 Paroxysmal atrial fibrillation: Secondary | ICD-10-CM | POA: Diagnosis not present

## 2017-01-19 DIAGNOSIS — Z7901 Long term (current) use of anticoagulants: Secondary | ICD-10-CM

## 2017-01-19 LAB — POCT INR: INR: 2.5

## 2017-01-20 DIAGNOSIS — K922 Gastrointestinal hemorrhage, unspecified: Secondary | ICD-10-CM

## 2017-01-20 HISTORY — DX: Gastrointestinal hemorrhage, unspecified: K92.2

## 2017-02-16 ENCOUNTER — Inpatient Hospital Stay (HOSPITAL_COMMUNITY)
Admission: EM | Admit: 2017-02-16 | Discharge: 2017-02-20 | DRG: 379 | Disposition: A | Discharge status: Home / Self Care | Payer: Medicare Other | Attending: Internal Medicine | Admitting: Internal Medicine

## 2017-02-16 ENCOUNTER — Emergency Department (HOSPITAL_COMMUNITY): Payer: Medicare Other

## 2017-02-16 ENCOUNTER — Other Ambulatory Visit: Payer: Self-pay

## 2017-02-16 ENCOUNTER — Encounter (HOSPITAL_COMMUNITY): Payer: Self-pay

## 2017-02-16 DIAGNOSIS — Z952 Presence of prosthetic heart valve: Secondary | ICD-10-CM

## 2017-02-16 DIAGNOSIS — I509 Heart failure, unspecified: Secondary | ICD-10-CM | POA: Diagnosis present

## 2017-02-16 DIAGNOSIS — D122 Benign neoplasm of ascending colon: Secondary | ICD-10-CM | POA: Diagnosis present

## 2017-02-16 DIAGNOSIS — K5731 Diverticulosis of large intestine without perforation or abscess with bleeding: Secondary | ICD-10-CM | POA: Diagnosis not present

## 2017-02-16 DIAGNOSIS — K922 Gastrointestinal hemorrhage, unspecified: Secondary | ICD-10-CM | POA: Diagnosis not present

## 2017-02-16 DIAGNOSIS — I2510 Atherosclerotic heart disease of native coronary artery without angina pectoris: Secondary | ICD-10-CM | POA: Diagnosis present

## 2017-02-16 DIAGNOSIS — R1030 Lower abdominal pain, unspecified: Secondary | ICD-10-CM | POA: Diagnosis not present

## 2017-02-16 DIAGNOSIS — K5730 Diverticulosis of large intestine without perforation or abscess without bleeding: Secondary | ICD-10-CM | POA: Diagnosis not present

## 2017-02-16 DIAGNOSIS — K640 First degree hemorrhoids: Secondary | ICD-10-CM | POA: Diagnosis present

## 2017-02-16 DIAGNOSIS — T45515A Adverse effect of anticoagulants, initial encounter: Secondary | ICD-10-CM | POA: Diagnosis present

## 2017-02-16 DIAGNOSIS — Z8719 Personal history of other diseases of the digestive system: Secondary | ICD-10-CM | POA: Diagnosis present

## 2017-02-16 DIAGNOSIS — I480 Paroxysmal atrial fibrillation: Secondary | ICD-10-CM | POA: Diagnosis not present

## 2017-02-16 DIAGNOSIS — G250 Essential tremor: Secondary | ICD-10-CM | POA: Diagnosis present

## 2017-02-16 DIAGNOSIS — I340 Nonrheumatic mitral (valve) insufficiency: Secondary | ICD-10-CM | POA: Diagnosis not present

## 2017-02-16 DIAGNOSIS — K625 Hemorrhage of anus and rectum: Secondary | ICD-10-CM | POA: Diagnosis present

## 2017-02-16 DIAGNOSIS — I341 Nonrheumatic mitral (valve) prolapse: Secondary | ICD-10-CM | POA: Diagnosis not present

## 2017-02-16 DIAGNOSIS — Z87891 Personal history of nicotine dependence: Secondary | ICD-10-CM

## 2017-02-16 DIAGNOSIS — Z9889 Other specified postprocedural states: Secondary | ICD-10-CM

## 2017-02-16 DIAGNOSIS — Z7901 Long term (current) use of anticoagulants: Secondary | ICD-10-CM

## 2017-02-16 DIAGNOSIS — I959 Hypotension, unspecified: Secondary | ICD-10-CM | POA: Diagnosis present

## 2017-02-16 DIAGNOSIS — Z9119 Patient's noncompliance with other medical treatment and regimen: Secondary | ICD-10-CM

## 2017-02-16 DIAGNOSIS — I4891 Unspecified atrial fibrillation: Secondary | ICD-10-CM | POA: Diagnosis not present

## 2017-02-16 DIAGNOSIS — Z888 Allergy status to other drugs, medicaments and biological substances status: Secondary | ICD-10-CM

## 2017-02-16 DIAGNOSIS — R42 Dizziness and giddiness: Secondary | ICD-10-CM

## 2017-02-16 DIAGNOSIS — R791 Abnormal coagulation profile: Secondary | ICD-10-CM | POA: Diagnosis present

## 2017-02-16 DIAGNOSIS — Z538 Procedure and treatment not carried out for other reasons: Secondary | ICD-10-CM | POA: Diagnosis not present

## 2017-02-16 LAB — CBC
HCT: 44.8 % (ref 39.0–52.0)
HCT: 40.9 % (ref 39.0–52.0)
HEMOGLOBIN: 13.9 g/dL (ref 13.0–17.0)
Hemoglobin: 15.5 g/dL (ref 13.0–17.0)
MCH: 31.3 pg (ref 26.0–34.0)
MCH: 30.6 pg (ref 26.0–34.0)
MCHC: 34.6 g/dL (ref 30.0–36.0)
MCHC: 34 g/dL (ref 30.0–36.0)
MCV: 90.5 fL (ref 78.0–100.0)
MCV: 90.1 fL (ref 78.0–100.0)
PLATELETS: 217 10*3/uL (ref 150–400)
Platelets: 182 10*3/uL (ref 150–400)
RBC: 4.95 MIL/uL (ref 4.22–5.81)
RBC: 4.54 MIL/uL (ref 4.22–5.81)
RDW: 14.5 % (ref 11.5–15.5)
RDW: 14.4 % (ref 11.5–15.5)
WBC: 9.2 10*3/uL (ref 4.0–10.5)
WBC: 7.4 10*3/uL (ref 4.0–10.5)

## 2017-02-16 LAB — POC OCCULT BLOOD, ED: Fecal Occult Bld: POSITIVE — AB

## 2017-02-16 LAB — COMPREHENSIVE METABOLIC PANEL
ALK PHOS: 75 U/L (ref 38–126)
ALT: 36 U/L (ref 17–63)
AST: 33 U/L (ref 15–41)
Albumin: 4.2 g/dL (ref 3.5–5.0)
Anion gap: 6 (ref 5–15)
BUN: 26 mg/dL — ABNORMAL HIGH (ref 6–20)
CO2: 26 mmol/L (ref 22–32)
CREATININE: 1.07 mg/dL (ref 0.61–1.24)
Calcium: 9.5 mg/dL (ref 8.9–10.3)
Chloride: 106 mmol/L (ref 101–111)
Glucose, Bld: 124 mg/dL — ABNORMAL HIGH (ref 65–99)
Potassium: 4.5 mmol/L (ref 3.5–5.1)
Sodium: 138 mmol/L (ref 135–145)
Total Bilirubin: 0.5 mg/dL (ref 0.3–1.2)
Total Protein: 7.1 g/dL (ref 6.5–8.1)

## 2017-02-16 LAB — PROTIME-INR
INR: 2
Prothrombin Time: 22.5 seconds — ABNORMAL HIGH (ref 11.4–15.2)

## 2017-02-16 LAB — I-STAT TROPONIN, ED: Troponin i, poc: 0 ng/mL (ref 0.00–0.08)

## 2017-02-16 LAB — I-STAT CG4 LACTIC ACID, ED: Lactic Acid, Venous: 1.7 mmol/L (ref 0.5–1.9)

## 2017-02-16 LAB — GLUCOSE, CAPILLARY: Glucose-Capillary: 88 mg/dL (ref 65–99)

## 2017-02-16 LAB — TYPE AND SCREEN
ABO/RH(D): O POS
Antibody Screen: NEGATIVE

## 2017-02-16 IMAGING — CT CT ABD-PELV W/ CM
2 of 5 series · 16 of 46 positions shown, 18 images · IV contrast (iopamidol)
Comparison: None.

CLINICAL DATA: Lower abdominal pain, melena

EXAM:
CT ABDOMEN AND PELVIS WITH CONTRAST
TECHNIQUE: Multidetector CT imaging of the abdomen and pelvis was performed
using the standard protocol following bolus administration of
intravenous contrast.
CONTRAST:  100mL [RK] IOPAMIDOL ([RK]) INJECTION 61%

[Series 2: axial st · axial · 0.82mm/px · z∈[-725,-335]mm · 13 of 91 slices shown, 15 images]
[im 7/91  soft-tissue]
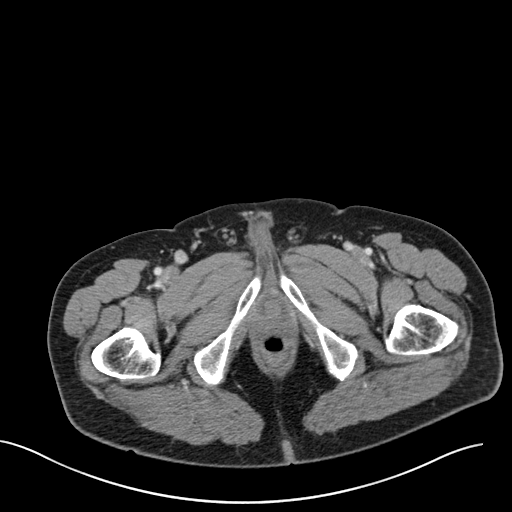
[im 7/91  bone]
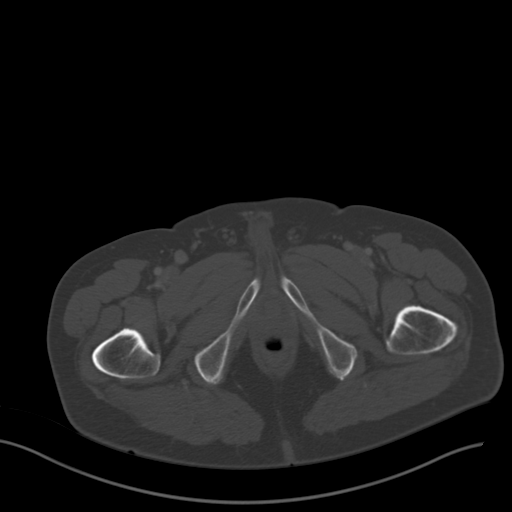
[im 13/91  soft-tissue]
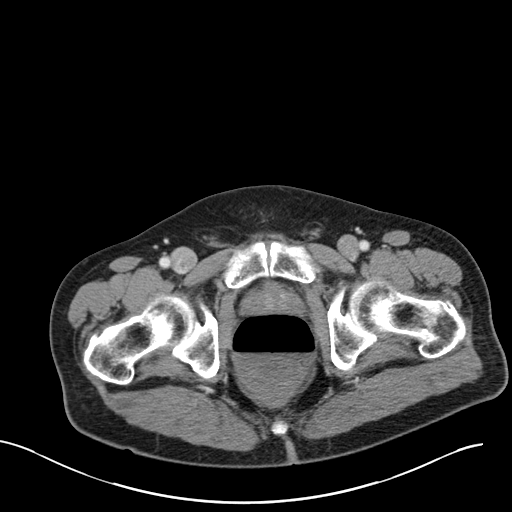
[im 19/91  soft-tissue]
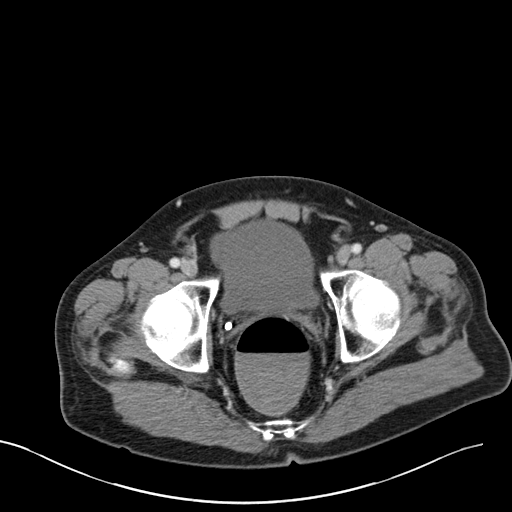
[im 25/91  soft-tissue]
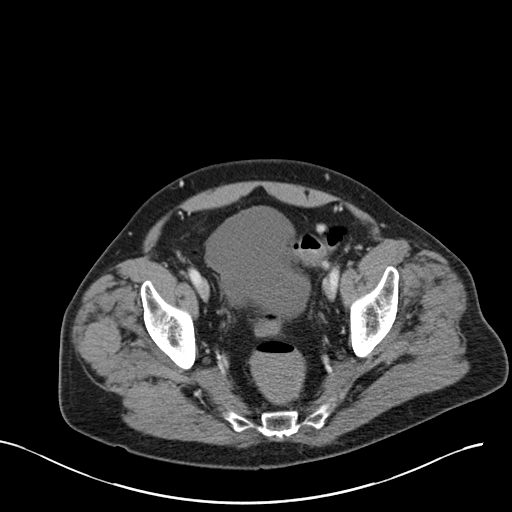
[im 31/91  soft-tissue]
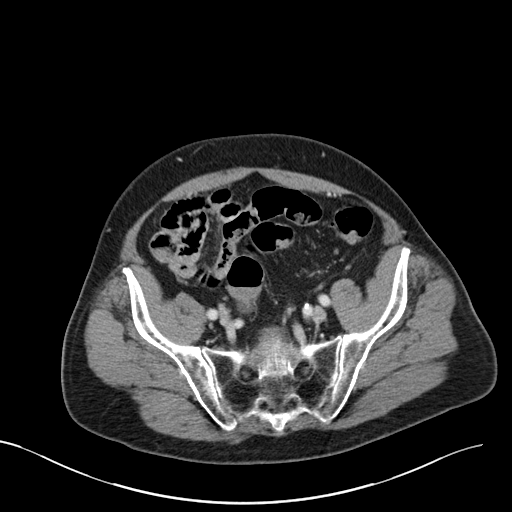
[im 37/91  soft-tissue]
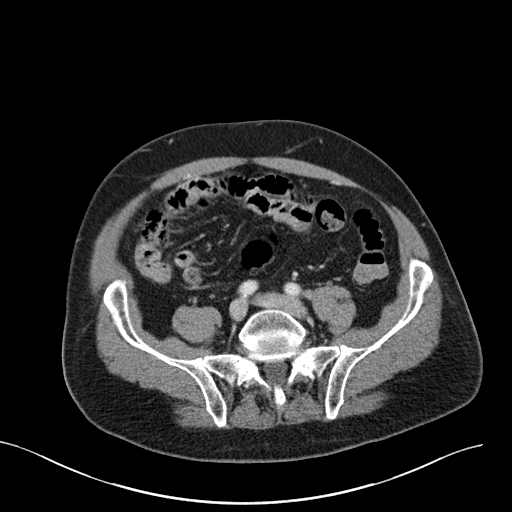
[im 49/91  soft-tissue]
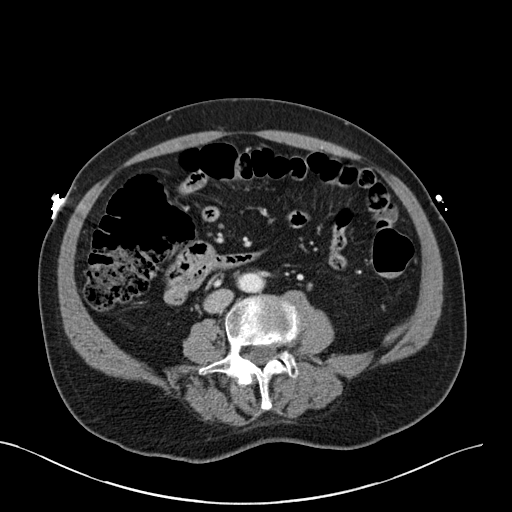
[im 55/91  soft-tissue]
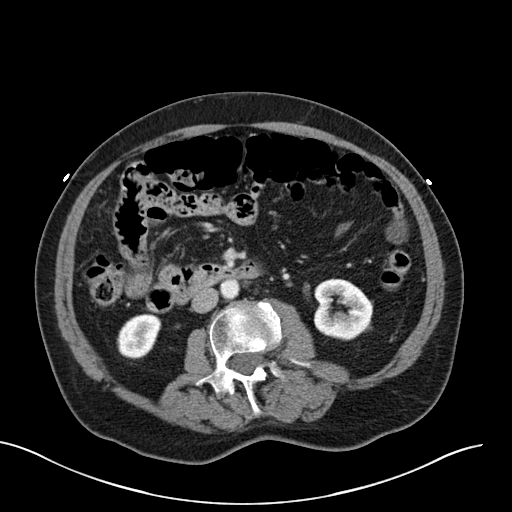
[im 61/91  soft-tissue]
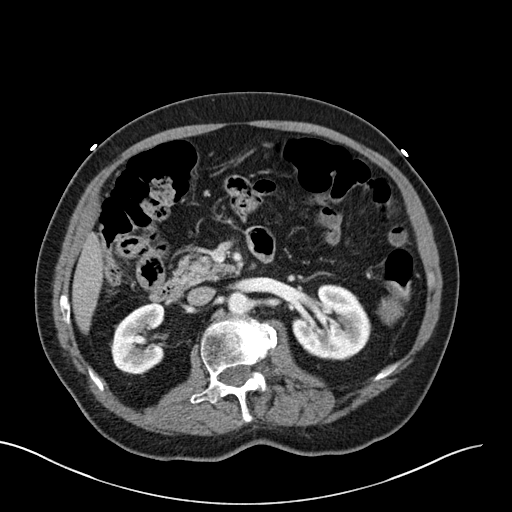
[im 61/91  bone]
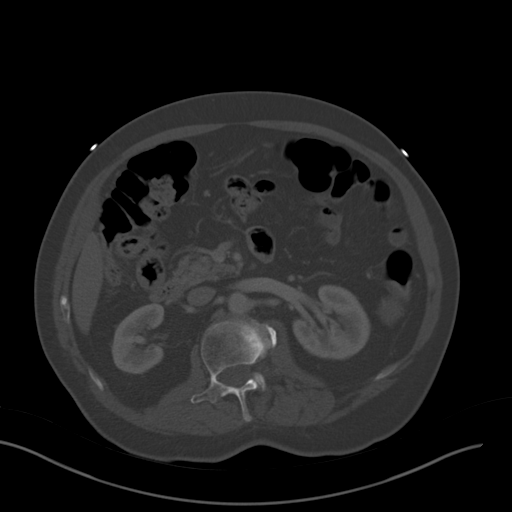
[im 67/91  soft-tissue]
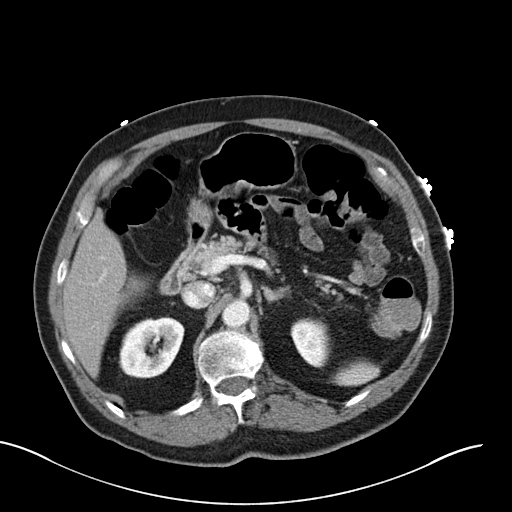
[im 73/91  soft-tissue]
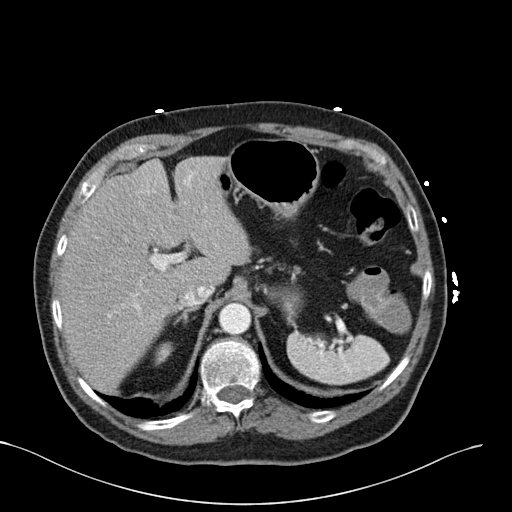
[im 79/91  soft-tissue]
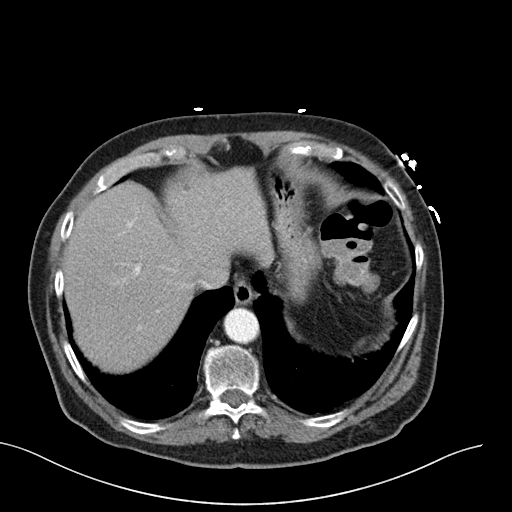
[im 85/91  soft-tissue]
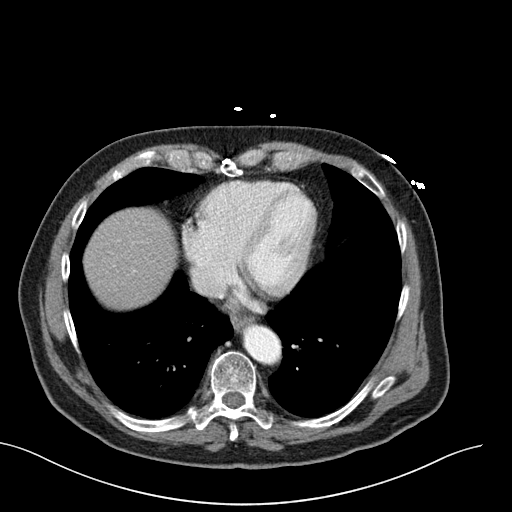

[Series 5: coronal st · coronal · 0.80mm/px · 3 of 100 slices shown]
[im 34/100  soft-tissue]
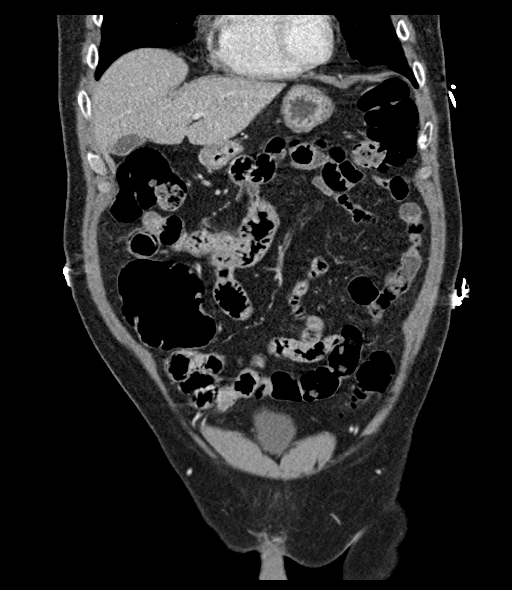
[im 45/100  soft-tissue]
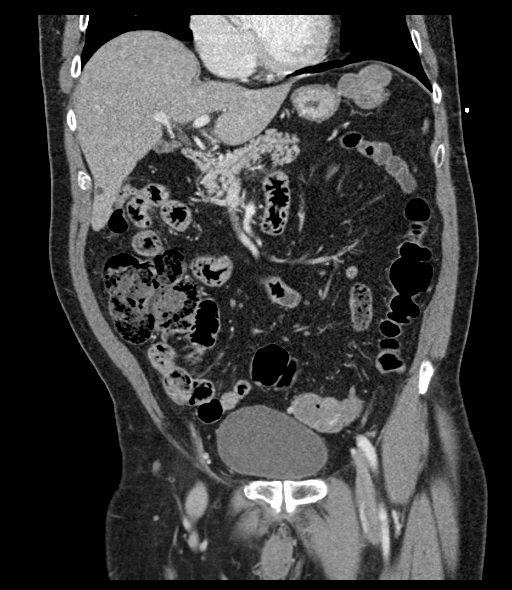
[im 56/100  soft-tissue]
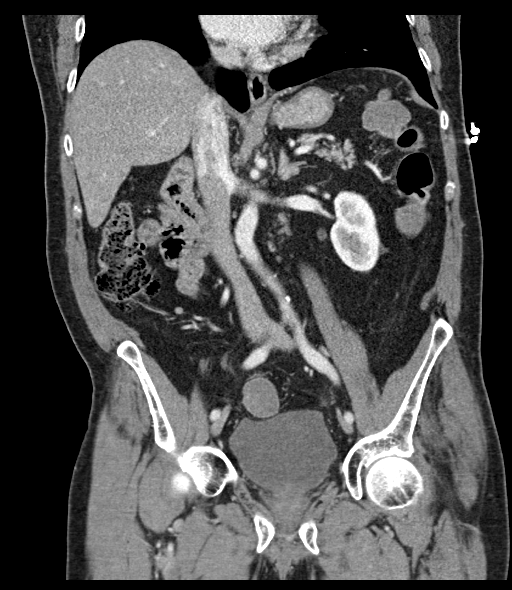

[16 of 46 positions shown; findings below may reference images not displayed]

FINDINGS: Lower chest: No acute abnormality.

Hepatobiliary: Scattered tiny hypodensities throughout the liver,
likely small cysts. Gallbladder is contracted.

Pancreas: No focal abnormality or ductal dilatation.

Spleen: No focal abnormality.  Normal size.

Adrenals/Urinary Tract: No adrenal abnormality. No focal renal
abnormality. No stones or hydronephrosis. Urinary bladder is
unremarkable.

Stomach/Bowel: Scattered sigmoid diverticula. No active
diverticulitis. Moderate stool in the colon. Liquid stool in the
left colon which could reflect diarrhea. Stomach and small bowel
decompressed, grossly unremarkable.

Vascular/Lymphatic: No evidence of aneurysm or adenopathy.

Reproductive: No visible focal abnormality.

Other: No free fluid or free air.

Musculoskeletal: No acute bony abnormality. Mild rightward scoliosis
in the lumbar spine.
IMPRESSION: Moderate stool in the colon. Few small scattered sigmoid
diverticula.

Scattered hypodensities in the liver, likely small cysts.

Fluid levels in the left colon could reflect diarrhea.

## 2017-02-16 MED ORDER — ACETAMINOPHEN 325 MG PO TABS
650.0000 mg | ORAL_TABLET | Freq: Four times a day (QID) | ORAL | Status: DC | PRN
  Administered 2017-02-17 – 2017-02-20 (×10): 650 mg via ORAL
  Filled 2017-02-16 (×10): qty 2

## 2017-02-16 MED ORDER — PANTOPRAZOLE SODIUM 40 MG IV SOLR
40.0000 mg | Freq: Once | INTRAVENOUS | Status: AC
  Administered 2017-02-16: 40 mg via INTRAVENOUS
  Filled 2017-02-16: qty 40

## 2017-02-16 MED ORDER — ONDANSETRON HCL 4 MG PO TABS: 4.0000 mg | ORAL_TABLET | Freq: Four times a day (QID) | ORAL | Status: DC | PRN

## 2017-02-16 MED ORDER — ACETAMINOPHEN 650 MG RE SUPP: 650.0000 mg | Freq: Four times a day (QID) | RECTAL | Status: DC | PRN

## 2017-02-16 MED ORDER — SODIUM CHLORIDE 0.9 % IV BOLUS (SEPSIS)
1000.0000 mL | Freq: Once | INTRAVENOUS | Status: AC
  Administered 2017-02-16: 1000 mL via INTRAVENOUS

## 2017-02-16 MED ORDER — IOPAMIDOL (ISOVUE-300) INJECTION 61%
INTRAVENOUS | Status: AC
  Administered 2017-02-16: 100 mL
  Filled 2017-02-16: qty 100

## 2017-02-16 MED ORDER — SODIUM CHLORIDE 0.9 % IV SOLN
INTRAVENOUS | Status: AC
  Administered 2017-02-16 – 2017-02-17 (×2): via INTRAVENOUS

## 2017-02-16 MED ORDER — ONDANSETRON HCL 4 MG/2ML IJ SOLN
4.0000 mg | Freq: Four times a day (QID) | INTRAMUSCULAR | Status: DC | PRN
  Administered 2017-02-17 – 2017-02-19 (×2): 4 mg via INTRAVENOUS
  Filled 2017-02-16: qty 2

## 2017-02-16 NOTE — ED Provider Notes (Signed)
[facility] DEPT Provider Note   CSN: [id number] Arrival date & time: 02/16/17  1523     History   Chief Complaint Chief Complaint  Patient presents with  . Melena  . Abdominal Pain    HPI Austin Davis is a 77 y.o. male with a PMHx of CHF (EF 50-55% in 04/2013), CAD, mitral regurg and prolapse s/p annuloplasty and MAZE, PAF on coumadin, and HLD, who presents to the ED with complaints of an episode of bright red blood per rectum.  Patient states that he had eaten lunch around 2 PM, ate a salad from [employer] with extra hand, toast, and a piece of apple pie that has been on the counter for a few days.  Shortly thereafter, he suddenly had the urge to defecate and had lower abdominal pain.  When he went to the restroom, he had a small amount of formed stool accompanied by a moderate amount of bright red blood in the toilet as well as on the toilet paper when he wiped.  This only happened once, but he states that he was a moderate amount of blood and this concerned him.  He denies that his stool was watery or melanotic.  He describes his abdominal pain as 3/10 sharp constant nonradiating generalized abdominal pain mostly in the lower abdomen, with no known aggravating factors, and moderately improved with Tylenol and Tums.  He states that he also had a brief episode of lightheadedness after having the bloody bowel movement, but feels fine now.  He mentions that he ate some pepperoni a few days ago that upset his stomach, and this morning he had a few more pieces of the pepperoni and thinks this could have also been contributing to today's issues.  He occasionally takes NSAIDs but it's fairly infrequent use and he hasn't had any in the last 4-5 days; he mostly just takes tylenol for his chronic arthralgias.  His PCP is Dr. Harrington Challenger at [facility].  He does not have a GI doctor that he sees, and he has never had a colonoscopy.   He denies fevers, chills, CP, SOB,  nausea/vomiting, diarrhea/constipation, melena, rectal pain, hematuria, dysuria, testicular pain/swelling, acute myalgias/arthralgias, numbness, tingling, focal weakness, or any other complaints at this time. Denies recent travel, sick contacts, EtOH use, frequent NSAID use, or prior abd surgeries.    The history is provided by the patient and medical records. No language interpreter was used.  Rectal Bleeding  Quality:  Bright red Amount:  Moderate Duration:  4 hours Timing:  Rare Chronicity:  New Context: spontaneously   Similar prior episodes: no   Relieved by:  None tried Worsened by:  Defecation Ineffective treatments:  None tried Associated symptoms: abdominal pain and light-headedness (intermittently)   Associated symptoms: no fever, no loss of consciousness and no vomiting   Risk factors: anticoagulant use     Past Medical History:  Diagnosis Date  . CHF (congestive heart failure) ([place])   . Coronary artery disease   . Mitral regurgitation   . Mitral valve prolapse   . Paroxysmal atrial fibrillation [facility])     Patient Active Problem List   Diagnosis Date Noted  . 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  . Essential tremor 02/19/2015  . Status post mitral valve annuloplasty and MAZE 2005 12/12/2014  . HLD (hyperlipidemia) 05/05/2013  . DOE (dyspnea on exertion) 04/22/2013  . Fatigue 04/22/2013  . Atrial fibrillation ([place]) 05/08/2012  . Long term (current) use of anticoagulants  05/08/2012    Past Surgical History:  Procedure Laterality Date  . MITRAL VALVE REPAIR  01/2003  . monitor  02/05/2006       Home Medications    Prior to Admission medications   Medication Sig Start Date End Date Taking? Authorizing Provider  CHLORPHENIRAMINE MALEATE PO Take 4 mg by mouth as needed.    [provider]  gabapentin (NEURONTIN) 300 MG capsule Take 1 capsule (300 mg total) by mouth 3 (three) times daily as needed. 02/19/15   Melvenia Beam, MD  GARLIC  PO Take 1 capsule by mouth daily.    [provider]  GUAIFENESIN PO Take 400 mg by mouth as needed.    [provider]  Multiple Vitamin (MULTIVITAMIN) capsule Take 1 capsule by mouth daily.    [provider]  omega-3 acid ethyl esters (LOVAZA) 1 g capsule Take 1 g by mouth 2 (two) times daily.    [provider]  Potassium Gluconate 550 MG TABS Take 1 tablet by mouth daily.    [provider]  warfarin (COUMADIN) 5 MG tablet TAKE 1 TO 1 & 1/2 TABLETS BY MOUTH EVERY DAY AS DIRECTED BY COUMADIN CLINIC 09/10/16   Lorretta Harp, MD    Family History Family History  Problem Relation Age of Onset  . Heart failure Father   . Heart failure Maternal Grandmother   . Pneumonia Maternal Grandfather   . Heart failure Paternal Grandmother   . Stroke Paternal Grandfather     Social History Social History   Tobacco Use  . Smoking status: Former Research scientist (life sciences)  . Smokeless tobacco: Never Used  Substance Use Topics  . Alcohol use: Yes  . Drug use: No     Allergies   Prednisone   Review of Systems Review of Systems  Constitutional: Negative for chills and fever.  Respiratory: Negative for shortness of breath.   Cardiovascular: Negative for chest pain.  Gastrointestinal: Positive for abdominal pain, anal bleeding, blood in stool and hematochezia. Negative for constipation, diarrhea, nausea, rectal pain and vomiting.  Genitourinary: Negative for dysuria, hematuria, scrotal swelling and testicular pain.  Musculoskeletal: Negative for arthralgias and myalgias.  Skin: Negative for color change.  Allergic/Immunologic: Negative for immunocompromised state.  Neurological: Positive for light-headedness (intermittently). Negative for loss of consciousness, weakness and numbness.  Hematological: Bruises/bleeds easily (on coumadin).  Psychiatric/Behavioral: Negative for confusion.   All other systems reviewed and are negative for acute change except as  noted in the HPI.    Physical Exam Updated Vital Signs BP 95/72 (BP Location: Right Arm)   Pulse 93   Temp 97.8 F (36.6 C) (Oral)   Resp 18   Wt 77.6 kg (171 lb)   SpO2 96%   BMI 26.00 kg/m  Exam VS: BP 112/80   Pulse 78   Temp 97.8 F (36.6 C) (Oral)   Resp 16   Wt 77.6 kg (171 lb)   SpO2 99%   BMI 26.00 kg/m    Physical Exam  Constitutional: He is oriented to person, place, and time. Vital signs are normal. He appears well-developed and well-nourished.  Non-toxic appearance. No distress.  Afebrile, nontoxic, NAD, BP soft 95/72 in triage but up to 112/80 on exam  HENT:  Head: Normocephalic and atraumatic.  Mouth/Throat: Oropharynx is clear and moist and mucous membranes are normal.  Eyes: Conjunctivae and EOM are normal. Right eye exhibits no discharge. Left eye exhibits no discharge.  Neck: Normal range of motion. Neck supple.  Cardiovascular: Normal rate, regular rhythm, normal heart sounds and intact distal pulses. Exam reveals no gallop and no friction rub.  No murmur heard. Pulmonary/Chest: Effort normal and breath sounds normal. No respiratory distress. He has no decreased breath sounds. He has no wheezes. He has no rhonchi. He has no rales.  Abdominal: Soft. Normal appearance and bowel sounds are normal. He exhibits no distension. There is tenderness in the right lower quadrant, suprapubic area and left lower quadrant. There is no rigidity, no rebound, no guarding, no CVA tenderness, no tenderness at McBurney's point and negative Murphy's sign.  Soft, nondistended, +BS throughout, with mild lower abd TTP mostly in the LLQ, no r/g/r, neg murphy's, neg mcburney's, no CVA TTP   Genitourinary: Prostate normal. Rectal exam shows external hemorrhoid (old skin tag) and guaiac positive stool. Rectal exam shows no internal hemorrhoid, no fissure, no mass, no tenderness and anal tone normal.  Genitourinary Comments: Chaperone present Moderate amount of gross dark red blood noted  on rectal exam, normal tone, no tenderness, no mass or fissure, one small external hemorrhoidal skin tag at ~6'o'clock position but nonthrombosed and nontender, no definite internal hemorrhoids palpable. FOBT+ Prostate without enlargement, warmth, tenderness, or bogginess.   Musculoskeletal: Normal range of motion.  Neurological: He is alert and oriented to person, place, and time. He has normal strength. No sensory deficit.  Skin: Skin is warm, dry and intact. No rash noted.  Psychiatric: He has a normal mood and affect.  Nursing note and vitals reviewed.   Orthostatic VS for the past 24 hrs:  BP- Lying Pulse- Lying BP- Sitting Pulse- Sitting BP- Standing at 0 minutes Pulse- Standing at 0 minutes  02/16/17 1700 107/76 81 96/72 93 100/69 102      ED Treatments / Results  Labs (all labs ordered are listed, but only abnormal results are displayed) Labs Reviewed  COMPREHENSIVE METABOLIC PANEL - Abnormal; Notable for the following components:      Result Value   Glucose, Bld 124 (*)    BUN 26 (*)    All other components within normal limits  PROTIME-INR - Abnormal; Notable for the following components:   Prothrombin Time 22.5 (*)    All other components within normal limits  POC OCCULT BLOOD, ED - Abnormal; Notable for the following components:   Fecal Occult Bld POSITIVE (*)    All other components within normal limits  CBC  URINALYSIS, ROUTINE W REFLEX MICROSCOPIC  I-STAT TROPONIN, ED  I-STAT CG4 LACTIC ACID, ED  TYPE AND SCREEN  ABO/RH    EKG  EKG Interpretation  Date/Time:  Monday February 16 2017 18:42:37 EST Ventricular Rate:  79 PR Interval:    QRS Duration: 102 QT Interval:  388 QTC Calculation: 445 R Axis:   49 Text Interpretation:  Sinus rhythm No old tracing to compare Confirmed by Dorie Rank [id number]) on 02/16/2017 6:45:04 PM       Radiology Ct Abdomen Pelvis W Contrast  Result Date: 02/16/2017 CLINICAL DATA:  Lower abdominal pain, melena EXAM: CT ABDOMEN  AND PELVIS WITH CONTRAST TECHNIQUE: Multidetector CT imaging of the abdomen and pelvis was performed using the standard protocol following bolus administration of intravenous contrast. CONTRAST:  137mL ISOVUE-300 IOPAMIDOL (ISOVUE-300) INJECTION 61% COMPARISON:  None. FINDINGS: Lower chest: No acute abnormality. Hepatobiliary: Scattered tiny hypodensities throughout the liver, likely small cysts. Gallbladder is contracted. Pancreas: No focal abnormality or ductal dilatation. Spleen: No focal abnormality.  Normal size. Adrenals/Urinary Tract: No adrenal abnormality. No focal renal abnormality. No  stones or hydronephrosis. Urinary bladder is unremarkable. Stomach/Bowel: Scattered sigmoid diverticula. No active diverticulitis. Moderate stool in the colon. Liquid stool in the left colon which could reflect diarrhea. Stomach and small bowel decompressed, grossly unremarkable. Vascular/Lymphatic: No evidence of aneurysm or adenopathy. Reproductive: No visible focal abnormality. Other: No free fluid or free air. Musculoskeletal: No acute bony abnormality. Mild rightward scoliosis in the lumbar spine. IMPRESSION: Moderate stool in the colon. Few small scattered sigmoid diverticula. Scattered hypodensities in the liver, likely small cysts. Fluid levels in the left colon could reflect diarrhea. Electronically Signed   By: Rolm Baptise M.D.   On: 02/16/2017 20:30    Echo 04/26/13: Study Conclusions - Left ventricle: Apical hypokinesis The cavity size was normal. Wall thickness was normal. Systolic function was normal. The estimated ejection fraction was in the range of 50% to 55%. - Aortic valve: Mild regurgitation. - Mitral valve: Calcified annulus. Mild regurgitation. - Left atrium: The atrium was mildly dilated. - Atrial septum: No defect or patent foramen ovale was identified.   Procedures Procedures (including critical care time)  CRITICAL CARE- acute GI bleed with hypotension Performed by:  Reece Agar   Total critical care time: 45 minutes  Critical care time was exclusive of separately billable procedures and treating other patients.  Critical care was necessary to treat or prevent imminent or life-threatening deterioration.  Critical care was time spent personally by me on the following activities: development of treatment plan with patient and/or surrogate as well as nursing, discussions with consultants, evaluation of patient's response to treatment, examination of patient, obtaining history from patient or surrogate, ordering and performing treatments and interventions, ordering and review of laboratory studies, ordering and review of radiographic studies, pulse oximetry and re-evaluation of patient's condition.   Medications Ordered in ED Medications  sodium chloride 0.9 % bolus 1,000 mL (1,000 mLs Intravenous New Bag/Given 02/16/17 1911)  pantoprazole (PROTONIX) injection 40 mg (40 mg Intravenous Given 02/16/17 1911)     Initial Impression / Assessment and Plan / ED Course  I have reviewed the triage vital signs and the nursing notes.  Pertinent labs & imaging results that were available during my care of the patient were reviewed by me and considered in my medical decision making (see chart for details).     77 y.o. male here with an episode of BRBPR around 2pm. Ate lunch and shortly afterwards had lower abd pain like he needed to have a BM, went to toilet and had small formed BM with moderate amount of bright red blood in the toilet as well as on the TP with wiping; painless. Felt lightheaded for a short period of time after having the bleeding episode, but feels better now. On exam, BP soft 90s/70s, orthostatics+ (HR 81 to 102 with laying to standing), mild lower abd TTP but nonperitoneal. Work up thus far reveals: CMP with mildly elevated BUN 26 but Cr WNL at 1.07 which is consistent with GI bleed, CMP otherwise WNL; CBC WNL, Hgb 15.5/Hct 44.8 consistent with  prior values. INR 2.0 which is therapeutic for him. Pt wanting to urinate before rectal exam, will collect U/A and recheck after this to do rectal exam; will likely do CT A/P but will await doing rectal exam first. Will also add-on lactic, troponin, EKG, U/A, and give 1L bolus. Will reassess after rectal exam is completed.   6:52 PM Rectal exam reveals moderate amount of dark red blood. FOBT grossly positive. Will proceed with CT abd/pelv. EKG unremarkable and nonischemic.  BP improved on recheck, now 112/80. Will still proceed with 1L bolus of fluids and give protonix. Pt declines wanting anything for pain at this time. He will need admission for this GI bleed. Will reassess shortly. Discussed case with my attending Dr. Tomi Bamberger who agrees with plan.   8:55 PM Trop neg. Lactic neg. U/A pending. CT abd/pelv reveals scattered diverticula with moderate colonic stool, and fluid in left colon which is likely blood considering his exam, but could be diarrhea. I suspect diverticular bleed. Dr. Hal Hope of [facility] returning page and will admit. Holding orders to be placed by admitting team. Please see their notes for further documentation of care. I appreciate their help with this pleasant pt's care. Pt stable at time of admission.    Final Clinical Impressions(s) / ED Diagnoses   Final diagnoses:  Acute GI bleeding  Lower abdominal pain  BRBPR (bright red blood per rectum)  Intermittent lightheadedness  Diverticulosis of large intestine with hemorrhage    ED Discharge Orders    [address], [place], [place] 02/16/17 2056    Dorie Rank, MD 02/17/17 0001

## 2017-02-16 NOTE — ED Notes (Signed)
Clicked off ct abd on accident, no completed.

## 2017-02-16 NOTE — ED Triage Notes (Signed)
Patient presents with lower abdominal pain and melena, starting at approx 2pm today. Patient reports he does take coumadin and his last dose was last night. Patient reports he ate lunch as usual, and when he got up to get the mail, he "felt like I had the squirts." Patient reports seeing "bright red blood on the toilet paper and in the toilet bowl" when he went to check himself. The patient denies trauma or injury. Patient reports a history of hemorrhoids, but " never bleeding like this."

## 2017-02-16 NOTE — ED Notes (Signed)
ED TO INPATIENT HANDOFF REPORT  Name/Age/Gender Austin Davis 77 y.o. male  Code Status    Code Status Orders  (From admission, onward)        Start     Ordered   02/16/17 2117  Full code  Continuous     02/16/17 2118    Code Status History    Date Active Date Inactive Code Status Order ID Comments User Context   This patient has a current code status but no historical code status.      Home/SNF/Other Home  Chief Complaint passing blood   Level of Care/Admitting Diagnosis ED Disposition    ED Disposition Condition Comment   Admit  Hospital Area: [facility] [[id number]]  Level of Care: Telemetry [5]  Admit to tele based on following criteria: Monitor for Ischemic changes  Diagnosis: Rectal bleeding [836629]  Admitting Physician: Austin Davis [phone]  Attending Physician: Austin Davis [phone]  PT Class (Do Not Modify): Observation [104]  PT Acc Code (Do Not Modify): Observation [10022]       Medical History Past Medical History:  Diagnosis Date  . CHF (congestive heart failure) ([place])   . Coronary artery disease   . Mitral regurgitation   . Mitral valve prolapse   . Paroxysmal atrial fibrillation ([facility])     Allergies Allergies  Allergen Reactions  . Prednisone Other (See Comments)    Makes skin crawl    IV Location/Drains/Wounds Patient Lines/Drains/Airways Status   Active Line/Drains/Airways    Name:   Placement date:   Placement time:   Site:   Days:   Peripheral IV 02/16/17 Right Forearm   02/16/17    1844    Forearm   less than 1          Labs/Imaging Results for orders placed or performed during the hospital encounter of 02/16/17 (from the past 48 hour(s))  Comprehensive metabolic panel     Status: Abnormal   Collection Time: 02/16/17  4:59 PM  Result Value Ref Range   Sodium 138 135 - 145 mmol/L   Potassium 4.5 3.5 - 5.1 mmol/L   Chloride 106 101 - 111 mmol/L   CO2 26 22 - 32 mmol/L   Glucose, Bld 124  (H) 65 - 99 mg/dL   BUN 26 (H) 6 - 20 mg/dL   Creatinine, Ser 1.07 0.61 - 1.24 mg/dL   Calcium 9.5 8.9 - 10.3 mg/dL   Total Protein 7.1 6.5 - 8.1 g/dL   Albumin 4.2 3.5 - 5.0 g/dL   AST 33 15 - 41 U/L   ALT 36 17 - 63 U/L   Alkaline Phosphatase 75 38 - 126 U/L   Total Bilirubin 0.5 0.3 - 1.2 mg/dL   GFR calc non Af Amer >60 >60 mL/min   GFR calc Af Amer >60 >60 mL/min    Comment: (NOTE) The eGFR has been calculated using the CKD EPI equation. This calculation has not been validated in all clinical situations. eGFR's persistently <60 mL/min signify possible Chronic Kidney Disease.    Anion gap 6 5 - 15  CBC     Status: None   Collection Time: 02/16/17  4:59 PM  Result Value Ref Range   WBC 9.2 4.0 - 10.5 K/uL   RBC 4.95 4.22 - 5.81 MIL/uL   Hemoglobin 15.5 13.0 - 17.0 g/dL   HCT 44.8 39.0 - 52.0 %   MCV 90.5 78.0 - 100.0 fL   MCH 31.3 26.0 - 34.0 pg  MCHC 34.6 30.0 - 36.0 g/dL   RDW 14.5 11.5 - 15.5 %   Platelets 217 150 - 400 K/uL  Type and screen [facility]     Status: None   Collection Time: 02/16/17  4:59 PM  Result Value Ref Range   ABO/RH(D) O POS    Antibody Screen NEG    Sample Expiration 02/19/2017   Protime-INR - (order if Patient is taking Coumadin / Warfarin)     Status: Abnormal   Collection Time: 02/16/17  4:59 PM  Result Value Ref Range   Prothrombin Time 22.5 (H) 11.4 - 15.2 seconds   INR 2.00   I-stat troponin, ED     Status: None   Collection Time: 02/16/17  6:44 PM  Result Value Ref Range   Troponin i, poc 0.00 0.00 - 0.08 ng/mL   Comment 3            Comment: Due to the release kinetics of cTnI, a negative result within the first hours of the onset of symptoms does not rule out myocardial infarction with certainty. If myocardial infarction is still suspected, repeat the test at appropriate intervals.   I-Stat CG4 Lactic Acid, ED     Status: None   Collection Time: 02/16/17  6:47 PM  Result Value Ref Range   Lactic Acid,  Venous 1.70 0.5 - 1.9 mmol/L  POC occult blood, ED     Status: Abnormal   Collection Time: 02/16/17  6:50 PM  Result Value Ref Range   Fecal Occult Bld POSITIVE (A) NEGATIVE   Ct Abdomen Pelvis W Contrast  Result Date: 02/16/2017 CLINICAL DATA:  Lower abdominal pain, melena EXAM: CT ABDOMEN AND PELVIS WITH CONTRAST TECHNIQUE: Multidetector CT imaging of the abdomen and pelvis was performed using the standard protocol following bolus administration of intravenous contrast. CONTRAST:  150m ISOVUE-300 IOPAMIDOL (ISOVUE-300) INJECTION 61% COMPARISON:  None. FINDINGS: Lower chest: No acute abnormality. Hepatobiliary: Scattered tiny hypodensities throughout the liver, likely small cysts. Gallbladder is contracted. Pancreas: No focal abnormality or ductal dilatation. Spleen: No focal abnormality.  Normal size. Adrenals/Urinary Tract: No adrenal abnormality. No focal renal abnormality. No stones or hydronephrosis. Urinary bladder is unremarkable. Stomach/Bowel: Scattered sigmoid diverticula. No active diverticulitis. Moderate stool in the colon. Liquid stool in the left colon which could reflect diarrhea. Stomach and small bowel decompressed, grossly unremarkable. Vascular/Lymphatic: No evidence of aneurysm or adenopathy. Reproductive: No visible focal abnormality. Other: No free fluid or free air. Musculoskeletal: No acute bony abnormality. Mild rightward scoliosis in the lumbar spine. IMPRESSION: Moderate stool in the colon. Few small scattered sigmoid diverticula. Scattered hypodensities in the liver, likely small cysts. Fluid levels in the left colon could reflect diarrhea. Electronically Signed   By: KRolm BaptiseM.D.   On: 02/16/2017 20:30    Pending Labs Unresulted Labs (From admission, onward)   Start     Ordered   02/17/17 03734 Basic metabolic panel  Tomorrow morning,   R     02/16/17 2118   02/17/17 0500  Protime-INR  Tomorrow morning,   R     02/16/17 2119   02/16/17 2118  CBC  Now then every  4 hours,   R     02/16/17 2118   02/16/17 1824  Urinalysis, Routine w reflex microscopic  STAT,   R     02/16/17 1823   02/16/17 1658  ABO/Rh  Once,   R     02/16/17 1658      Vitals/Pain  Today's Vitals   02/16/17 1655 02/16/17 1836 02/16/17 2038  BP: 95/72 112/80 119/76  Pulse: 93 78 70  Resp: _0 Temp: 97.8 F (36.6 C)  97.6 F (36.4 C)  TempSrc: Oral  Oral  SpO2: 96% 99% 100%  Weight: 171 lb (77.6 kg)    PainSc: 4       Isolation Precautions No active isolations  Medications Medications  acetaminophen (TYLENOL) tablet 650 mg (not administered)    Or  acetaminophen (TYLENOL) suppository 650 mg (not administered)  ondansetron (ZOFRAN) tablet 4 mg (not administered)    Or  ondansetron (ZOFRAN) injection 4 mg (not administered)  0.9 %  sodium chloride infusion (not administered)  sodium chloride 0.9 % bolus 1,000 mL (0 mLs Intravenous Stopped 02/16/17 2015)  pantoprazole (PROTONIX) injection 40 mg (40 mg Intravenous Given 02/16/17 1911)  iopamidol (ISOVUE-300) 61 % injection (100 mLs  Contrast Given 02/16/17 2015)    Mobility walks

## 2017-02-16 NOTE — ED Notes (Signed)
Patient transported to CT 

## 2017-02-16 NOTE — H&P (Signed)
History and Physical    Austin Davis TDD:[id number] DOB: [birth year] DOA: 02/16/2017  PCP: [name], MD  Patient coming from: Home.  Chief Complaint: Rectal bleeding.  HPI: Austin Davis is a 77 y.o. male with history of mitral valve repair and maze procedure and atrial fibrillation on Coumadin presents to the ER with complaints of rectal bleeding.  Patient states last evening patient felt some discomfort and went to the bathroom had a large bloody bowel movement.  Denies any nausea vomiting or any dizziness or loss of consciousness.  Patient takes NSAID at least 4-5 times a month.  Has never had colonoscopy previously.  ED Course: In the ER on rectal exam patient had frank blood in the rectum as per the ER physician.  Patient has not had any further bowel movements but whenever he passes flatus he has some blood coming out.  Since patient had some discomfort in the abdomen CT abdomen and pelvis was done which shows features concerning for diverticular and fluid-filled colon.  On presentation patient was having low normal blood pressure which improved with fluid bolus.  Review of Systems: As per HPI, rest all negative.   Past Medical History:  Diagnosis Date  . CHF (congestive heart failure) ([place])   . Coronary artery disease   . Mitral regurgitation   . Mitral valve prolapse   . Paroxysmal atrial fibrillation [facility])     Past Surgical History:  Procedure Laterality Date  . MITRAL VALVE REPAIR  01/2003  . monitor  02/05/2006     reports that he has quit smoking. he has never used smokeless tobacco. He reports that he drinks alcohol. He reports that he does not use drugs.  Allergies  Allergen Reactions  . Prednisone Other (See Comments)    Makes skin crawl    Family History  Problem Relation Age of Onset  . Heart failure Father   . Heart failure Maternal Grandmother   . Pneumonia Maternal Grandfather   . Heart failure Paternal Grandmother   . Stroke Paternal  Grandfather     Prior to Admission medications   Medication Sig Start Date End Date Taking? Authorizing Provider  acetaminophen (TYLENOL) 500 MG tablet Take 500 mg by mouth every 6 (six) hours as needed for mild pain or headache.   Yes [provider]  ibuprofen (ADVIL,MOTRIN) 200 MG tablet Take 200 mg by mouth every 6 (six) hours as needed for headache or mild pain.   Yes [provider]  Multiple Vitamin (MULTIVITAMIN) capsule Take 1 capsule by mouth daily.   Yes [provider]  omega-3 acid ethyl esters (LOVAZA) 1 g capsule Take 1 g by mouth 2 (two) times daily.   Yes [provider]  warfarin (COUMADIN) 5 MG tablet TAKE 1 TO 1 & 1/2 TABLETS BY MOUTH EVERY DAY AS DIRECTED BY COUMADIN CLINIC Patient taking differently: Take 5mg  by mouth daily 09/10/16  Yes Lorretta Harp, MD  gabapentin (NEURONTIN) 300 MG capsule Take 1 capsule (300 mg total) by mouth 3 (three) times daily as needed. Patient not taking: Reported on 02/16/2017 02/19/15   Melvenia Beam, MD    Physical Exam: Vitals:   02/16/17 1655 02/16/17 1836 02/16/17 2038  BP: 95/72 112/80 119/76  Pulse: 93 78 70  Resp: 18 16 16   Temp: 97.8 F (36.6 C)  97.6 F (36.4 C)  TempSrc: Oral  Oral  SpO2: 96% 99% 100%  Weight: 77.6 kg (171 lb)  Constitutional: Moderately built and nourished. Vitals:   02/16/17 1655 02/16/17 1836 02/16/17 2038  BP: 95/72 112/80 119/76  Pulse: 93 78 70  Resp: 18 16 16   Temp: 97.8 F (36.6 C)  97.6 F (36.4 C)  TempSrc: Oral  Oral  SpO2: 96% 99% 100%  Weight: 77.6 kg (171 lb)     Eyes: Anicteric no pallor. ENMT: No discharge from the ears eyes nose or mouth. Neck: No mass felt.  No neck rigidity. Respiratory: No rhonchi or crepitations. Cardiovascular: S1-S2 heard no murmurs appreciated. Abdomen: Soft nontender bowel sounds present. Musculoskeletal: No edema.  No joint effusion. Skin: No rash.  Skin appears warm. Neurologic: Alert awake  oriented to time place and person.  Moves all extremities. Psychiatric: Appears normal.  Normal affect.   Labs on Admission: I have personally reviewed following labs and imaging studies  CBC: Recent Labs  Lab 02/16/17 1659  WBC 9.2  HGB 15.5  HCT 44.8  MCV 90.5  PLT 235   Basic Metabolic Panel: Recent Labs  Lab 02/16/17 1659  NA 138  K 4.5  CL 106  CO2 26  GLUCOSE 124*  BUN 26*  CREATININE 1.07  CALCIUM 9.5   GFR: Estimated Creatinine Clearance: 56.8 mL/min (by C-G formula based on SCr of 1.07 mg/dL). Liver Function Tests: Recent Labs  Lab 02/16/17 1659  AST 33  ALT 36  ALKPHOS 75  BILITOT 0.5  PROT 7.1  ALBUMIN 4.2   No results for input(s): LIPASE, AMYLASE in the last 168 hours. No results for input(s): AMMONIA in the last 168 hours. Coagulation Profile: Recent Labs  Lab 02/16/17 1659  INR 2.00   Cardiac Enzymes: No results for input(s): CKTOTAL, CKMB, CKMBINDEX, TROPONINI in the last 168 hours. BNP (last 3 results) No results for input(s): PROBNP in the last 8760 hours. HbA1C: No results for input(s): HGBA1C in the last 72 hours. CBG: No results for input(s): GLUCAP in the last 168 hours. Lipid Profile: No results for input(s): CHOL, HDL, LDLCALC, TRIG, CHOLHDL, LDLDIRECT in the last 72 hours. Thyroid Function Tests: No results for input(s): TSH, T4TOTAL, FREET4, T3FREE, THYROIDAB in the last 72 hours. Anemia Panel: No results for input(s): VITAMINB12, FOLATE, FERRITIN, TIBC, IRON, RETICCTPCT in the last 72 hours. Urine analysis:    Component Value Date/Time   COLORURINE YELLOW 09/25/2006 1500   APPEARANCEUR CLEAR 09/25/2006 1500   LABSPEC 1.015 09/25/2006 1500   PHURINE 7.5 09/25/2006 1500   GLUCOSEU NEGATIVE 09/25/2006 1500   HGBUR NEGATIVE 09/25/2006 1500   BILIRUBINUR NEGATIVE 09/25/2006 1500   KETONESUR NEGATIVE 09/25/2006 1500   PROTEINUR NEGATIVE 09/25/2006 1500   UROBILINOGEN 0.2 09/25/2006 1500   NITRITE NEGATIVE 09/25/2006  1500   LEUKOCYTESUR  09/25/2006 1500    NEGATIVE MICROSCOPIC NOT DONE ON URINES WITH NEGATIVE PROTEIN, BLOOD, LEUKOCYTES, NITRITE, OR GLUCOSE <1000 mg/dL.   Sepsis Labs: @LABRCNTIP (procalcitonin:4,lacticidven:4) )No results found for this or any previous visit (from the past 240 hour(s)).   Radiological Exams on Admission: Ct Abdomen Pelvis W Contrast  Result Date: 02/16/2017 CLINICAL DATA:  Lower abdominal pain, melena EXAM: CT ABDOMEN AND PELVIS WITH CONTRAST TECHNIQUE: Multidetector CT imaging of the abdomen and pelvis was performed using the standard protocol following bolus administration of intravenous contrast. CONTRAST:  162mL ISOVUE-300 IOPAMIDOL (ISOVUE-300) INJECTION 61% COMPARISON:  None. FINDINGS: Lower chest: No acute abnormality. Hepatobiliary: Scattered tiny hypodensities throughout the liver, likely small cysts. Gallbladder is contracted. Pancreas: No focal abnormality or ductal dilatation. Spleen: No focal abnormality.  Normal size.  Adrenals/Urinary Tract: No adrenal abnormality. No focal renal abnormality. No stones or hydronephrosis. Urinary bladder is unremarkable. Stomach/Bowel: Scattered sigmoid diverticula. No active diverticulitis. Moderate stool in the colon. Liquid stool in the left colon which could reflect diarrhea. Stomach and small bowel decompressed, grossly unremarkable. Vascular/Lymphatic: No evidence of aneurysm or adenopathy. Reproductive: No visible focal abnormality. Other: No free fluid or free air. Musculoskeletal: No acute bony abnormality. Mild rightward scoliosis in the lumbar spine. IMPRESSION: Moderate stool in the colon. Few small scattered sigmoid diverticula. Scattered hypodensities in the liver, likely small cysts. Fluid levels in the left colon could reflect diarrhea. Electronically Signed   By: Rolm Baptise M.D.   On: 02/16/2017 20:30     Assessment/Plan Principal Problem:   Rectal bleeding Active Problems:   Atrial fibrillation (HCC)   Long term  (current) use of anticoagulants   Status post mitral valve annuloplasty and MAZE 2005   Essential tremor    1. Rectal bleeding -suspect likely diverticular in origin since CT scan also shows diverticulosis.  However patient does take NSAIDs for which we will keep patient on Protonix.  Will keep patient n.p.o. for now and check serial CBC.  If patient gets to become hypotensive we will consult radiology for bleeding scan otherwise consult gastroenterologist in the morning.  Holding Coumadin. 2. History of atrial fibrillation and mitral valve repair -discussed with patient about holding Coumadin.  Patient at this time agrees for holding Coumadin.  I have not reversed INR at this time.  Closely follow INR.  If there is any further bleed will give vitamin K and/or FFP.   DVT prophylaxis: SCDs. Code Status: Full code. Family Communication: Discussed with patient. Disposition Plan: Home. Consults called: None. Admission status: Observation.   Rise Patience MD [facility] Pager [phone].  If 7PM-7AM, please contact night-coverage [email or web address] Password [employer]  02/16/2017, 9:18 PM

## 2017-02-16 NOTE — ED Notes (Signed)
PA at bedside.

## 2017-02-17 DIAGNOSIS — K922 Gastrointestinal hemorrhage, unspecified: Secondary | ICD-10-CM

## 2017-02-17 DIAGNOSIS — I341 Nonrheumatic mitral (valve) prolapse: Secondary | ICD-10-CM | POA: Diagnosis present

## 2017-02-17 DIAGNOSIS — K5731 Diverticulosis of large intestine without perforation or abscess with bleeding: Principal | ICD-10-CM

## 2017-02-17 DIAGNOSIS — I480 Paroxysmal atrial fibrillation: Secondary | ICD-10-CM | POA: Diagnosis present

## 2017-02-17 DIAGNOSIS — Z8719 Personal history of other diseases of the digestive system: Secondary | ICD-10-CM | POA: Diagnosis present

## 2017-02-17 DIAGNOSIS — K921 Melena: Secondary | ICD-10-CM | POA: Diagnosis not present

## 2017-02-17 DIAGNOSIS — Z538 Procedure and treatment not carried out for other reasons: Secondary | ICD-10-CM | POA: Diagnosis not present

## 2017-02-17 DIAGNOSIS — G250 Essential tremor: Secondary | ICD-10-CM | POA: Diagnosis not present

## 2017-02-17 DIAGNOSIS — R1030 Lower abdominal pain, unspecified: Secondary | ICD-10-CM

## 2017-02-17 DIAGNOSIS — Z87891 Personal history of nicotine dependence: Secondary | ICD-10-CM | POA: Diagnosis not present

## 2017-02-17 DIAGNOSIS — I959 Hypotension, unspecified: Secondary | ICD-10-CM | POA: Diagnosis present

## 2017-02-17 DIAGNOSIS — Z888 Allergy status to other drugs, medicaments and biological substances status: Secondary | ICD-10-CM | POA: Diagnosis not present

## 2017-02-17 DIAGNOSIS — Z9889 Other specified postprocedural states: Secondary | ICD-10-CM | POA: Diagnosis not present

## 2017-02-17 DIAGNOSIS — K911 Postgastric surgery syndromes: Secondary | ICD-10-CM | POA: Diagnosis not present

## 2017-02-17 DIAGNOSIS — Z9119 Patient's noncompliance with other medical treatment and regimen: Secondary | ICD-10-CM | POA: Diagnosis not present

## 2017-02-17 DIAGNOSIS — Z7901 Long term (current) use of anticoagulants: Secondary | ICD-10-CM | POA: Diagnosis not present

## 2017-02-17 DIAGNOSIS — I509 Heart failure, unspecified: Secondary | ICD-10-CM | POA: Diagnosis present

## 2017-02-17 DIAGNOSIS — K625 Hemorrhage of anus and rectum: Secondary | ICD-10-CM | POA: Diagnosis not present

## 2017-02-17 DIAGNOSIS — D122 Benign neoplasm of ascending colon: Secondary | ICD-10-CM | POA: Diagnosis present

## 2017-02-17 DIAGNOSIS — I2510 Atherosclerotic heart disease of native coronary artery without angina pectoris: Secondary | ICD-10-CM | POA: Diagnosis present

## 2017-02-17 DIAGNOSIS — K5140 Inflammatory polyps of colon without complications: Secondary | ICD-10-CM | POA: Diagnosis not present

## 2017-02-17 DIAGNOSIS — I340 Nonrheumatic mitral (valve) insufficiency: Secondary | ICD-10-CM | POA: Diagnosis present

## 2017-02-17 DIAGNOSIS — K640 First degree hemorrhoids: Secondary | ICD-10-CM | POA: Diagnosis present

## 2017-02-17 DIAGNOSIS — R791 Abnormal coagulation profile: Secondary | ICD-10-CM | POA: Diagnosis present

## 2017-02-17 DIAGNOSIS — T45515A Adverse effect of anticoagulants, initial encounter: Secondary | ICD-10-CM | POA: Diagnosis present

## 2017-02-17 DIAGNOSIS — Z952 Presence of prosthetic heart valve: Secondary | ICD-10-CM | POA: Diagnosis not present

## 2017-02-17 DIAGNOSIS — I4891 Unspecified atrial fibrillation: Secondary | ICD-10-CM | POA: Diagnosis not present

## 2017-02-17 LAB — CBC
HCT: 38.4 % — ABNORMAL LOW (ref 39.0–52.0)
HEMATOCRIT: 38.1 % — AB (ref 39.0–52.0)
HEMATOCRIT: 39.8 % (ref 39.0–52.0)
HEMOGLOBIN: 13.7 g/dL (ref 13.0–17.0)
Hemoglobin: 12.7 g/dL — ABNORMAL LOW (ref 13.0–17.0)
Hemoglobin: 13.2 g/dL (ref 13.0–17.0)
MCH: 29.7 pg (ref 26.0–34.0)
MCH: 31.1 pg (ref 26.0–34.0)
MCH: 30.6 pg (ref 26.0–34.0)
MCHC: 33.3 g/dL (ref 30.0–36.0)
MCHC: 34.4 g/dL (ref 30.0–36.0)
MCHC: 34.4 g/dL (ref 30.0–36.0)
MCV: 89.2 fL (ref 78.0–100.0)
MCV: 90.2 fL (ref 78.0–100.0)
MCV: 89.1 fL (ref 78.0–100.0)
PLATELETS: 193 10*3/uL (ref 150–400)
PLATELETS: 194 10*3/uL (ref 150–400)
Platelets: 196 10*3/uL (ref 150–400)
RBC: 4.27 MIL/uL (ref 4.22–5.81)
RBC: 4.41 MIL/uL (ref 4.22–5.81)
RBC: 4.31 MIL/uL (ref 4.22–5.81)
RDW: 14.7 % (ref 11.5–15.5)
RDW: 14.6 % (ref 11.5–15.5)
RDW: 14.7 % (ref 11.5–15.5)
WBC: 7.9 10*3/uL (ref 4.0–10.5)
WBC: 8.2 10*3/uL (ref 4.0–10.5)
WBC: 7.4 10*3/uL (ref 4.0–10.5)

## 2017-02-17 LAB — PROTIME-INR
INR: 1.97
Prothrombin Time: 22.3 seconds — ABNORMAL HIGH (ref 11.4–15.2)

## 2017-02-17 LAB — BASIC METABOLIC PANEL
Anion gap: 6 (ref 5–15)
BUN: 20 mg/dL (ref 6–20)
CHLORIDE: 105 mmol/L (ref 101–111)
CO2: 26 mmol/L (ref 22–32)
CREATININE: 0.93 mg/dL (ref 0.61–1.24)
Calcium: 8.6 mg/dL — ABNORMAL LOW (ref 8.9–10.3)
GFR calc Af Amer: >60 mL/min (ref >60)
GFR calc non Af Amer: >60 mL/min (ref >60)
GLUCOSE: 98 mg/dL (ref 65–99)
POTASSIUM: 4 mmol/L (ref 3.5–5.1)
SODIUM: 137 mmol/L (ref 135–145)

## 2017-02-17 LAB — GLUCOSE, CAPILLARY
GLUCOSE-CAPILLARY: 93 mg/dL (ref 65–99)
Glucose-Capillary: 101 mg/dL — ABNORMAL HIGH (ref 65–99)

## 2017-02-17 LAB — ABO/RH: ABO/RH(D): O POS

## 2017-02-17 MED ORDER — PHYTONADIONE 1 MG/0.5 ML ORAL SOLUTION
1.0000 mg | Freq: Once | ORAL | Status: AC
  Administered 2017-02-17: 1 mg via ORAL
  Filled 2017-02-17: qty 0.5

## 2017-02-17 MED ORDER — SODIUM CHLORIDE 0.9 % IV SOLN
INTRAVENOUS | Status: DC
  Administered 2017-02-17: 23:00:00 via INTRAVENOUS

## 2017-02-17 MED ORDER — PANTOPRAZOLE SODIUM 40 MG IV SOLR
40.0000 mg | Freq: Two times a day (BID) | INTRAVENOUS | Status: DC
  Administered 2017-02-17 – 2017-02-20 (×7): 40 mg via INTRAVENOUS
  Filled 2017-02-17 (×7): qty 40

## 2017-02-17 MED ORDER — PEG 3350-KCL-NA BICARB-NACL 420 G PO SOLR
4000.0000 mL | Freq: Once | ORAL | Status: AC
  Administered 2017-02-17: 4000 mL via ORAL

## 2017-02-17 NOTE — Progress Notes (Signed)
  PROGRESS NOTE  VELDON WAGER OEH:[id number] DOB: 1940/09/19 DOA: 02/16/2017 PCP: [name], MD  Brief Narrative: 62yom PMH PAF on warfarin, no previous colonoscopy or bleeding presented with BRBPR. Uses NSAIDs 4-5 times/month. Admitted for GIB, suspect diverticular.  Assessment/Plan GIB, suspect diverticular complicated by warfarin-induced coagulopathy.  - Hgb trending down but still above 12. Hemodynamics stable. - suspect diverticular in nature. No previous colonoscopy. Will consult GI for further recs - trend Hgb  PAF, MAZE, MVP s/p annuloplasty presented with BRBPR - hold warfarin  DVT prophylaxis: SCDs Code Status: full Family Communication: wife at bedside Disposition Plan: home   Murray Hodgkins, MD  [facility] Direct contact: [phone] --[place] [place]  --[email or web address]; password [email or web address]  7PM-7AM contact night coverage as above 02/17/2017, 9:43 AM  LOS: 0 days   Consultants:  GI  Procedures:    Antimicrobials:    Interval history/Subjective: Feels ok. Some diffuse and lower abdominal soreness. Some blood with BM this AM.  Objective: Vitals:  Vitals:   02/16/17 2100 02/16/17 2157  BP: 109/77 (!) 152/82  Pulse: 70 72  Resp: 15 16  Temp:  98.1 F (36.7 C)  SpO2: 100% 100%    Exam:  Constitutional:  . Appears calm and comfortable Eyes:  . pupils and irises appear normal . Normal lids  ENMT:  . grossly normal hearing  . Lips appear normal Respiratory:  . CTA bilaterally, no w/r/r.  . Respiratory effort normal.  Cardiovascular:  . RRR, no m/r/g . No LE extremity edema   Abdomen:  . Appears unremarkable Soft, ntnd . No hernias Neurologic:  . Fine tremor of head Psychiatric:  . Mental status o Mood, affect appropriate   I have personally reviewed the following:   Labs:  BMP, LFts unremarkable  Hgb 15.5 > 13.9 > 12.7  Imaging studies:  CT abd/pelvis Moderate stool in the colon. Few small scattered  sigmoid diverticula.  Medical tests:  EKG SR   Scheduled Meds: . pantoprazole (PROTONIX) IV  40 mg Intravenous Q12H   Continuous Infusions: . sodium chloride 50 mL/hr at 02/16/17 2202    Principal Problem:   Rectal bleeding Active Problems:   Atrial fibrillation ([place])   Long term (current) use of anticoagulants   Status post mitral valve annuloplasty and MAZE 2005   Essential tremor   LOS: 0 days

## 2017-02-17 NOTE — Plan of Care (Signed)
  Progressing Education: Knowledge of General Education information will improve 02/17/2017 2239 - Progressing by Talbert Forest, RN Health Behavior/Discharge Planning: Ability to manage health-related needs will improve 02/17/2017 2239 - Progressing by Talbert Forest, RN Clinical Measurements: Ability to maintain clinical measurements within normal limits will improve 02/17/2017 2239 - Progressing by Talbert Forest, RN Activity: Risk for activity intolerance will decrease 02/17/2017 2239 - Progressing by Talbert Forest, RN Elimination: Will not experience complications related to urinary retention 02/17/2017 2239 - Progressing by Talbert Forest, RN Pain Managment: General experience of comfort will improve 02/17/2017 2239 - Progressing by Talbert Forest, RN Safety: Ability to remain free from injury will improve 02/17/2017 2239 - Progressing by Talbert Forest, RN Skin Integrity: Risk for impaired skin integrity will decrease 02/17/2017 2239 - Progressing by Talbert Forest, RN

## 2017-02-17 NOTE — Care Management Note (Signed)
Case Management Note  Patient Details  Name: Austin Davis MRN: [id number] Date of Birth: 08-17-1940  Subjective/Objective: 77 y/o m admitted w/Rectal bleed. From home.                   Action/Plan:d/c plan home.   Expected Discharge Date:  (unknown)               Expected Discharge Plan:     In-House Referral:     Discharge planning Services     Post Acute Care Choice:    Choice offered to:     DME Arranged:    DME Agency:     HH Arranged:    HH Agency:     Status of Service:     If discussed at [employer], dates discussed:    Additional Comments:  Dessa Phi, RN 02/17/2017, 11:59 AM

## 2017-02-17 NOTE — Consult Note (Signed)
Referring Provider: Dr. Sarajane Jews Primary Care Physician:  [name], MD Primary Gastroenterologist:  Althia Forts  Reason for Consultation:  Rectal bleeding  HPI: Austin Davis is a 77 y.o. male with CHF, CAD, Mitral regurgitation with mitral valve repair, and Afib on chronic Coumadin (INR 2.0) who had the acute onset of red blood per rectum (X 1) described as a large amount with loose stools. Denies abdominal pain or rectal pain. Felt dizzy following the bleeding episode. Denies N/V. Takes Ibuprofen prn. Has never had a colonoscopy.  Hgb 13.7. INR 2.0. BUN 20/ Cr 0.93. Daughter at bedside.  Past Medical History:  Diagnosis Date  . CHF (congestive heart failure) ([place])   . Coronary artery disease   . Mitral regurgitation   . Mitral valve prolapse   . Paroxysmal atrial fibrillation [facility])     Past Surgical History:  Procedure Laterality Date  . MITRAL VALVE REPAIR  01/2003  . monitor  02/05/2006    Prior to Admission medications   Medication Sig Start Date End Date Taking? Authorizing Provider  acetaminophen (TYLENOL) 500 MG tablet Take 500 mg by mouth every 6 (six) hours as needed for mild pain or headache.   Yes [provider]  ibuprofen (ADVIL,MOTRIN) 200 MG tablet Take 200 mg by mouth every 6 (six) hours as needed for headache or mild pain.   Yes [provider]  Multiple Vitamin (MULTIVITAMIN) capsule Take 1 capsule by mouth daily.   Yes [provider]  omega-3 acid ethyl esters (LOVAZA) 1 g capsule Take 1 g by mouth 2 (two) times daily.   Yes [provider]  warfarin (COUMADIN) 5 MG tablet TAKE 1 TO 1 & 1/2 TABLETS BY MOUTH EVERY DAY AS DIRECTED BY COUMADIN CLINIC Patient taking differently: Take 5mg  by mouth daily 09/10/16  Yes Lorretta Harp, MD  gabapentin (NEURONTIN) 300 MG capsule Take 1 capsule (300 mg total) by mouth 3 (three) times daily as needed. Patient not taking: Reported on 02/16/2017 02/19/15   Melvenia Beam, MD     Scheduled Meds: . pantoprazole (PROTONIX) IV  40 mg Intravenous Q12H  . polyethylene glycol-electrolytes  4,000 mL Oral Once   Continuous Infusions: . sodium chloride 125 mL/hr at 02/17/17 1304  . sodium chloride     PRN Meds:.acetaminophen **OR** acetaminophen, ondansetron **OR** ondansetron (ZOFRAN) IV  Allergies as of 02/16/2017 - Review Complete 02/16/2017  Allergen Reaction Noted  . Prednisone Other (See Comments) 05/03/2013    Family History  Problem Relation Age of Onset  . Heart failure Father   . Heart failure Maternal Grandmother   . Pneumonia Maternal Grandfather   . Heart failure Paternal Grandmother   . Stroke Paternal Grandfather     Social History   Socioeconomic History  . Marital status: Single    Spouse name: Not on file  . Number of children: 2  . Years of education: 15  . Highest education level: Not on file  Social Needs  . Financial resource strain: Not on file  . Food insecurity - worry: Not on file  . Food insecurity - inability: Not on file  . Transportation needs - medical: Not on file  . Transportation needs - non-medical: Not on file  Occupational History  . Occupation: [employer]  Tobacco Use  . Smoking status: Former Research scientist (life sciences)  . Smokeless tobacco: Never Used  Substance and Sexual Activity  . Alcohol use: Yes  . Drug use: No  . Sexual activity: Not on file  Other Topics Concern  . Not on file  Social History Narrative   Lives with girlfriend   Caffeine use: none    Review of Systems: All negative except as stated above in HPI.  Physical Exam: Vital signs: Vitals:   02/16/17 2157 02/17/17 1337  BP: (!) 152/82 (!) 148/87  Pulse: 72 69  Resp: 16 16  Temp: 98.1 F (36.7 C) (!) 97.4 F (36.3 C)  SpO2: 100% 100%   Last BM Date: 02/17/17 General:   Lethargic, elderly, well-developed, well-nourished, pleasant and cooperative in NAD, tremulous Head: normocephalic, atraumatic Eyes: anicteric sclera ENT:  oropharynx clear Neck: supple, nontender Lungs:  Clear throughout to auscultation.   No wheezes, crackles, or rhonchi. No acute distress. Heart:  Regular rate and rhythm; no murmurs, clicks, rubs,  or gallops. Abdomen: soft, nontender, nondistended, +BS  Rectal:  Deferred Ext: no edema  GI:  Lab Results: Recent Labs    02/16/17 2209 02/17/17 0145 02/17/17 1026  WBC 7.4 7.9 8.2  HGB 13.9 12.7* 13.7  HCT 40.9 38.1* 39.8  PLT 182 193 196   BMET Recent Labs    02/16/17 1659 02/17/17 0145  NA 138 137  K 4.5 4.0  CL 106 105  CO2 26 26  GLUCOSE 124* 98  BUN 26* 20  CREATININE 1.07 0.93  CALCIUM 9.5 8.6*   LFT Recent Labs    02/16/17 1659  PROT 7.1  ALBUMIN 4.2  AST 33  ALT 36  ALKPHOS 75  BILITOT 0.5   PT/INR Recent Labs    02/16/17 1659 02/17/17 0145  LABPROT 22.5* 22.3*  INR 2.00 1.97     Studies/Results: Ct Abdomen Pelvis W Contrast  Result Date: 02/16/2017 CLINICAL DATA:  Lower abdominal pain, melena EXAM: CT ABDOMEN AND PELVIS WITH CONTRAST TECHNIQUE: Multidetector CT imaging of the abdomen and pelvis was performed using the standard protocol following bolus administration of intravenous contrast. CONTRAST:  11mL ISOVUE-300 IOPAMIDOL (ISOVUE-300) INJECTION 61% COMPARISON:  None. FINDINGS: Lower chest: No acute abnormality. Hepatobiliary: Scattered tiny hypodensities throughout the liver, likely small cysts. Gallbladder is contracted. Pancreas: No focal abnormality or ductal dilatation. Spleen: No focal abnormality.  Normal size. Adrenals/Urinary Tract: No adrenal abnormality. No focal renal abnormality. No stones or hydronephrosis. Urinary bladder is unremarkable. Stomach/Bowel: Scattered sigmoid diverticula. No active diverticulitis. Moderate stool in the colon. Liquid stool in the left colon which could reflect diarrhea. Stomach and small bowel decompressed, grossly unremarkable. Vascular/Lymphatic: No evidence of aneurysm or adenopathy. Reproductive: No  visible focal abnormality. Other: No free fluid or free air. Musculoskeletal: No acute bony abnormality. Mild rightward scoliosis in the lumbar spine. IMPRESSION: Moderate stool in the colon. Few small scattered sigmoid diverticula. Scattered hypodensities in the liver, likely small cysts. Fluid levels in the left colon could reflect diarrhea. Electronically Signed   By: Rolm Baptise M.D.   On: 02/16/2017 20:30    Impression/Plan: Painless hematochezia likely due to diverticular bleeding but due to need to resume anticoagulation at discharge will do an inpatient colonoscopy to evaluate for colonic malignancy. Colon prep today. Clear liquid diet. NPO p MN. Colonoscopy tomorrow. He and his daughter's questions were answered.    LOS: 0 days   [place] C.  02/17/2017, 2:52 PM  Pager [phone]  AFTER 5 pm or on weekends please call [phone]

## 2017-02-17 NOTE — Care Management Obs Status (Signed)
[place] NOTIFICATION   Patient Details  Name: Austin Davis MRN: [id number] Date of Birth: 08/23/1940   Medicare Observation Status Notification Given:  Yes    MahabirJuliann Pulse, RN 02/17/2017, 12:00 PM

## 2017-02-18 DIAGNOSIS — Z9889 Other specified postprocedural states: Secondary | ICD-10-CM

## 2017-02-18 DIAGNOSIS — G250 Essential tremor: Secondary | ICD-10-CM

## 2017-02-18 LAB — CBC
HCT: 38.1 % — ABNORMAL LOW (ref 39.0–52.0)
Hemoglobin: 13 g/dL (ref 13.0–17.0)
MCH: 30.7 pg (ref 26.0–34.0)
MCHC: 34.1 g/dL (ref 30.0–36.0)
MCV: 89.9 fL (ref 78.0–100.0)
PLATELETS: 173 10*3/uL (ref 150–400)
RBC: 4.24 MIL/uL (ref 4.22–5.81)
RDW: 14.8 % (ref 11.5–15.5)
WBC: 6.2 10*3/uL (ref 4.0–10.5)

## 2017-02-18 LAB — PROTIME-INR
INR: 1.49
PROTHROMBIN TIME: 17.9 s — AB (ref 11.4–15.2)

## 2017-02-18 MED ORDER — POLYETHYLENE GLYCOL 3350 17 GM/SCOOP PO POWD
1.0000 | Freq: Once | ORAL | Status: DC
  Filled 2017-02-18: qty 255

## 2017-02-18 MED ORDER — POLYETHYLENE GLYCOL 3350 17 GM/SCOOP PO POWD
238.0000 g | Freq: Once | ORAL | Status: AC
  Administered 2017-02-18: 238 g via ORAL
  Filled 2017-02-18: qty 238

## 2017-02-18 NOTE — Plan of Care (Signed)
  Progressing Clinical Measurements: Will remain free from infection 02/18/2017 0946 - Progressing by Saunders Glance, RN Diagnostic test results will improve 02/18/2017 0946 - Progressing by Saunders Glance, RN Respiratory complications will improve 02/18/2017 0946 - Progressing by Saunders Glance, RN Cardiovascular complication will be avoided 02/18/2017 0946 - Progressing by Saunders Glance, RN

## 2017-02-18 NOTE — Progress Notes (Signed)
[facility]astroenterology Progress Note  Austin Davis 77 y.o. [birth year]   Subjective: Slowly drinking colon prep and reports 3 stools overnight. Colonoscopy cancelled today due to noncompliance with drinking colon prep.  Objective: Vital signs: Vitals:   02/17/17 2059 02/18/17 0600  BP: (!) 148/92 (!) 151/78  Pulse: 73 66  Resp: 14 14  Temp: 98.7 F (37.1 C) 98 F (36.7 C)  SpO2: 100% 98%    Physical Exam: Gen: alert, no acute distress  HEENT: anicteric sclera CV: RRR Chest: CTA B Abd: soft, nontender, nondistended, +BS   Lab Results: Recent Labs    02/16/17 1659 02/17/17 0145  NA 138 137  K 4.5 4.0  CL 106 105  CO2 26 26  GLUCOSE 124* 98  BUN 26* 20  CREATININE 1.07 0.93  CALCIUM 9.5 8.6*   Recent Labs    02/16/17 1659  AST 33  ALT 36  ALKPHOS 75  BILITOT 0.5  PROT 7.1  ALBUMIN 4.2   Recent Labs    02/17/17 1459 02/18/17 0532  WBC 7.4 6.2  HGB 13.2 13.0  HCT 38.4* 38.1*  MCV 89.1 89.9  PLT 194 173      Assessment/Plan: Painless hematochezia in need of a colonoscopy but unable to drink adequate amounts of Trilyte last night. Will change to Miralax prep and NPO p MN with colonoscopy planned for tomorrow. Again strongly encouraged patient to complete prep.   [place] C. 02/18/2017, 12:16 PM  Pager [phone]  AFTER 5 PM or on weekends please call [phone] ID: Austin Davis, male   DOB: 1940/09/09, 77 y.o.   MRN: [id number]

## 2017-02-18 NOTE — Progress Notes (Signed)
  PROGRESS NOTE  AMYR SLUDER CBJ:[id number] DOB: [birth year] DOA: 02/16/2017 PCP: [name], MD  Brief Narrative: 17yom PMH PAF on warfarin, no previous colonoscopy or bleeding presented with BRBPR. Uses NSAIDs 4-5 times/month. Admitted for GIB, suspect diverticular.  Assessment/Plan GIB,  Suspect diverticular bleed.  Hgb stable. -Consult GI, appreciate cares  PAF, MAZE, MVP s/p annuloplasty presented with BRBPR -Hold warfarin  DVT prophylaxis: SCDs Code Status: full Family Communication: none present Disposition Plan: home   Norina Buzzard, MD  [facility] --Contact [address] [place]  --[email or web address]; password [email or web address]  7PM-7AM contact night coverage as above 02/18/2017, 1:00 PM  LOS: 1 day   Consultants:  GI  Procedures:    Antimicrobials:    Interval history/Subjective: No cehst pain, dyspnea, fever, confusion. Overnight, felt bloated with prep, so couldn't finish.  No vomiting, stools still "wine colored".  Objective: Vitals:  Vitals:   02/18/17 0600 02/18/17 1238  BP: (!) 151/78 130/89  Pulse: 66 77  Resp: 14   Temp: 98 F (36.7 C) 97.6 F (36.4 C)  SpO2: 98% 100%    Exam:  BP 130/89 (BP Location: Right Arm)   Pulse 77   Temp 97.6 F (36.4 C) (Oral)   Resp 14   Ht 5\' 8"  (1.727 m)   Wt 79.7 kg (175 lb 11.3 oz)   SpO2 100%   BMI 26.72 kg/m   General: Healthy, alert and in no distress.  Responds appropriately to questions.  Eye contact, dress and hygiene appropriate. HEENT: Corneas clear, conjunctivae and sclerae normal without injection or icterus, lids and lashes normal.  Visual tracking smooth.  OP moist without erythema, exudates, cobblestoning, or ulcers.  No airway deformities.  Neck supple.   Cardiac: RRR, nl S1-S2, no murmurs, rubs, gallops.  Capillary refill is less than 2 seconds.   Respiratory: Normal respiratory rate and rhythm.  CTAB without rales or wheezes. Abdomen: BS present.  No TTP or rebound all quadrants.  No  masses or organomegaly.   No ascites, distension.     I have personally reviewed the following:   Labs:  INR slightly up  Hgb stable     Scheduled Meds: . pantoprazole (PROTONIX) IV  40 mg Intravenous Q12H  . polyethylene glycol powder  238 g Oral Once   Continuous Infusions: . sodium chloride 10 mL/hr at 02/17/17 2238    Principal Problem:   Rectal bleeding Active Problems:   Atrial fibrillation ([place])   Long term (current) use of anticoagulants   Status post mitral valve annuloplasty and MAZE 2005   Essential tremor   GIB (gastrointestinal bleeding)   LOS: 1 day

## 2017-02-18 NOTE — H&P (View-Only) (Signed)
[facility] Gastroenterology Progress Note  Austin Davis 77 y.o. Dec 22, 1940   Subjective: Slowly drinking colon prep and reports 3 stools overnight. Colonoscopy cancelled today due to noncompliance with drinking colon prep.  Objective: Vital signs: Vitals:   02/17/17 2059 02/18/17 0600  BP: (!) 148/92 (!) 151/78  Pulse: 73 66  Resp: 14 14  Temp: 98.7 F (37.1 C) 98 F (36.7 C)  SpO2: 100% 98%    Physical Exam: Gen: alert, no acute distress  HEENT: anicteric sclera CV: RRR Chest: CTA B Abd: soft, nontender, nondistended, +BS   Lab Results: Recent Labs    02/16/17 1659 02/17/17 0145  NA 138 137  K 4.5 4.0  CL 106 105  CO2 26 26  GLUCOSE 124* 98  BUN 26* 20  CREATININE 1.07 0.93  CALCIUM 9.5 8.6*   Recent Labs    02/16/17 1659  AST 33  ALT 36  ALKPHOS 75  BILITOT 0.5  PROT 7.1  ALBUMIN 4.2   Recent Labs    02/17/17 1459 02/18/17 0532  WBC 7.4 6.2  HGB 13.2 13.0  HCT 38.4* 38.1*  MCV 89.1 89.9  PLT 194 173      Assessment/Plan: Painless hematochezia in need of a colonoscopy but unable to drink adequate amounts of Trilyte last night. Will change to Miralax prep and NPO p MN with colonoscopy planned for tomorrow. Again strongly encouraged patient to complete prep.   [place] C. 02/18/2017, 12:16 PM  Pager [phone]  AFTER 5 PM or on weekends please call [phone] ID: Austin Davis, male   DOB: [birth year], 77 y.o.   MRN: [id number]

## 2017-02-18 NOTE — Plan of Care (Signed)
  Education: Knowledge of General Education information will improve 02/18/2017 2305 - Progressing by Ashley Murrain, RN   Activity: Risk for activity intolerance will decrease 02/18/2017 2305 - Progressing by Ashley Murrain, RN

## 2017-02-18 NOTE — Progress Notes (Signed)
GI MD on call, Dr Cristina Gong, paged. Pt has finished one half of bowel prep at this time and has been complaining of "full feeling" as well as nausea throughout shift. Pt NPO for colonoscopy scheduled for 1145. Pt has only had one BM during this shift. Per MD, hold bowel prep and resume tomorrow morning. Pt placed back on clear liquid diet. Pt informed of plan of care. Will continue to monitor.

## 2017-02-19 ENCOUNTER — Inpatient Hospital Stay (HOSPITAL_COMMUNITY): Payer: Medicare Other | Admitting: Anesthesiology

## 2017-02-19 ENCOUNTER — Encounter (HOSPITAL_COMMUNITY): Payer: Self-pay | Admitting: *Deleted

## 2017-02-19 ENCOUNTER — Encounter (HOSPITAL_COMMUNITY)
Admission: EM | Disposition: A | Discharge status: Home / Self Care | Payer: Self-pay | Source: Home / Self Care | Attending: Internal Medicine

## 2017-02-19 HISTORY — PX: COLONOSCOPY WITH PROPOFOL: SHX5780

## 2017-02-19 LAB — CBC
HCT: 37.8 % — ABNORMAL LOW (ref 39.0–52.0)
Hemoglobin: 12.9 g/dL — ABNORMAL LOW (ref 13.0–17.0)
MCH: 30.7 pg (ref 26.0–34.0)
MCHC: 34.1 g/dL (ref 30.0–36.0)
MCV: 90 fL (ref 78.0–100.0)
PLATELETS: 190 10*3/uL (ref 150–400)
RBC: 4.2 MIL/uL — ABNORMAL LOW (ref 4.22–5.81)
RDW: 14.7 % (ref 11.5–15.5)
WBC: 5.6 10*3/uL (ref 4.0–10.5)

## 2017-02-19 SURGERY — COLONOSCOPY WITH PROPOFOL
Anesthesia: Monitor Anesthesia Care

## 2017-02-19 MED ORDER — PROPOFOL 10 MG/ML IV BOLUS
INTRAVENOUS | Status: AC
  Filled 2017-02-19: qty 20

## 2017-02-19 MED ORDER — PROPOFOL 500 MG/50ML IV EMUL
INTRAVENOUS | Status: DC | PRN
  Administered 2017-02-19: 200 ug/kg/min via INTRAVENOUS

## 2017-02-19 MED ORDER — PHENYLEPHRINE HCL 10 MG/ML IJ SOLN
INTRAMUSCULAR | Status: DC | PRN
  Administered 2017-02-19 (×3): 80 ug via INTRAVENOUS

## 2017-02-19 MED ORDER — PROPOFOL 10 MG/ML IV BOLUS
INTRAVENOUS | Status: AC
  Filled 2017-02-19: qty 40

## 2017-02-19 MED ORDER — LACTATED RINGERS IV SOLN
INTRAVENOUS | Status: AC | PRN
  Administered 2017-02-19: 1000 mL via INTRAVENOUS

## 2017-02-19 SURGICAL SUPPLY — 22 items

## 2017-02-19 NOTE — Interval H&P Note (Signed)
History and Physical Interval Note:  02/19/2017 11:58 AM  Austin Davis  has presented today for surgery, with the diagnosis of rectal bleeding  The various methods of treatment have been discussed with the patient and family. After consideration of risks, benefits and other options for treatment, the patient has consented to  Procedure(s): COLONOSCOPY WITH PROPOFOL (N/A) as a surgical intervention .  The patient's history has been reviewed, patient examined, no change in status, stable for surgery.  I have reviewed the patient's chart and labs.  Questions were answered to the patient's satisfaction.     [place] C.

## 2017-02-19 NOTE — Anesthesia Preprocedure Evaluation (Signed)
Anesthesia Evaluation  Patient identified by MRN, date of birth, ID band Patient awake    Reviewed: Allergy & Precautions, NPO status , Patient's Chart, lab work & pertinent test results  Airway Mallampati: II  TM Distance: >3 FB Neck ROM: Full    Dental no notable dental hx.    Pulmonary neg pulmonary ROS, former smoker,    Pulmonary exam normal breath sounds clear to auscultation       Cardiovascular + CAD  negative cardio ROS Normal cardiovascular exam Rhythm:Regular Rate:Normal     Neuro/Psych negative neurological ROS  negative psych ROS   GI/Hepatic negative GI ROS, Neg liver ROS,   Endo/Other  negative endocrine ROS  Renal/GU negative Renal ROS  negative genitourinary   Musculoskeletal negative musculoskeletal ROS (+)   Abdominal   Peds negative pediatric ROS (+)  Hematology negative hematology ROS (+)   Anesthesia Other Findings   Reproductive/Obstetrics negative OB ROS                             Anesthesia Physical Anesthesia Plan  ASA: III  Anesthesia Plan: MAC   Post-op Pain Management:    Induction: Intravenous  PONV Risk Score and Plan: 1 and Ondansetron  Airway Management Planned: Simple Face Mask  Additional Equipment:   Intra-op Plan:   Post-operative Plan:   Informed Consent: I have reviewed the patients History and Physical, chart, labs and discussed the procedure including the risks, benefits and alternatives for the proposed anesthesia with the patient or authorized representative who has indicated his/her understanding and acceptance.   Dental advisory given  Plan Discussed with: CRNA  Anesthesia Plan Comments:         Anesthesia Quick Evaluation

## 2017-02-19 NOTE — Plan of Care (Signed)
  Activity: Risk for activity intolerance will decrease 02/19/2017 2215 - Progressing by Ashley Murrain, RN   Nutrition: Adequate nutrition will be maintained 02/19/2017 2215 - Progressing by Ashley Murrain, RN

## 2017-02-19 NOTE — Transfer of Care (Signed)
Immediate Anesthesia Transfer of Care Note  Patient: Austin Davis  Procedure(s) Performed: COLONOSCOPY WITH PROPOFOL (N/A )  Patient Location: PACU  Anesthesia Type:MAC  Level of Consciousness: sedated and patient cooperative  Airway & Oxygen Therapy: Patient Spontanous Breathing and Patient connected to face mask oxygen  Post-op Assessment: Report given to RN and Post -op Vital signs reviewed and stable  Post vital signs: stable  Last Vitals:  Vitals:   02/19/17 1113 02/19/17 1240  BP: 130/80 117/84  Pulse:  70  Resp: 14 19  Temp: 36.7 C 36.6 C  SpO2: 97% 100%    Last Pain:  Vitals:   02/19/17 1240  TempSrc: Oral  PainSc:       Patients Stated Pain Goal: 0 (76/81/15 7262)  Complications: No apparent anesthesia complications

## 2017-02-19 NOTE — Anesthesia Procedure Notes (Signed)
Procedure Name: MAC Date/Time: 02/19/2017 12:02 PM Performed by: Lissa Morales, CRNA Pre-anesthesia Checklist: Patient identified, Emergency Drugs available, Suction available, Patient being monitored and Timeout performed Patient Re-evaluated:Patient Re-evaluated prior to induction Oxygen Delivery Method: Simple face mask Placement Confirmation: positive ETCO2

## 2017-02-19 NOTE — Anesthesia Postprocedure Evaluation (Signed)
Anesthesia Post Note  Patient: Austin Davis  Procedure(s) Performed: COLONOSCOPY WITH PROPOFOL (N/A )     Patient location during evaluation: PACU Anesthesia Type: MAC Level of consciousness: awake and alert Pain management: pain level controlled Vital Signs Assessment: post-procedure vital signs reviewed and stable Respiratory status: spontaneous breathing, nonlabored ventilation and respiratory function stable Cardiovascular status: stable and blood pressure returned to baseline Postop Assessment: no apparent nausea or vomiting Anesthetic complications: no    Last Vitals:  Vitals:   02/19/17 1255 02/19/17 1312  BP: 129/67 125/78  Pulse: (!) 59 61  Resp: 14 18  Temp:  36.5 C  SpO2: 100% 100%    Last Pain:  Vitals:   02/19/17 1312  TempSrc: Oral  PainSc:                  [name]

## 2017-02-19 NOTE — Brief Op Note (Signed)
Colonoscopy shows small amount of scattered red blood and small clots in the sigmoid colon. Suspect diverticular bleed that has resolved. Ascending colon polyp removed. Poor prep of proximal colon and will need a repeat colonoscopy in 3-4 months to evaluate proximal colon. Soft diet and advance as tolerated to high fiber diet. Hold Coumadin for another 5 days. Ok to go home tomorrow from a GI standpoint if stable. Will f/u on path as an outpt.

## 2017-02-19 NOTE — Op Note (Signed)
[facility] Patient Name: Austin Davis Procedure Date: 02/19/2017 MRN: [id number] Attending MD: Lear Ng , MD Date of Birth: 1940/03/28 CSN: [id number] Age: 77 Admit Type: Inpatient Procedure:                Colonoscopy Indications:              This is the patient's first colonoscopy,                            Hematochezia Providers:                Lear Ng, MDJennifer Kappus, RN, Tinnie Gens, Technician Referring MD:             hospital team Medicines:                Propofol per Anesthesia, Monitored Anesthesia Care Complications:            No immediate complications. Estimated Blood Loss:     Estimated blood loss: none. Procedure:                Pre-Anesthesia Assessment:                           - Prior to the procedure, a History and Physical                            was performed, and patient medications and                            allergies were reviewed. The patient's tolerance of                            previous anesthesia was also reviewed. The risks                            and benefits of the procedure and the sedation                            options and risks were discussed with the patient.                            All questions were answered, and informed consent                            was obtained. Prior Anticoagulants: The patient has                            taken Coumadin (warfarin), last dose was stopped at                            admission. ASA Grade Assessment: III - A patient  with severe systemic disease. After reviewing the                            risks and benefits, the patient was deemed in                            satisfactory condition to undergo the procedure.                           After obtaining informed consent, the colonoscope                            was passed under direct vision. Throughout the       procedure, the patient's blood pressure, pulse, and                            oxygen saturations were monitored continuously. The                            EC-[id number] ([id number]) scope was introduced through                            the anus and advanced to the the cecum, identified                            by appendiceal orifice and ileocecal valve. The                            colonoscopy was performed with difficulty due to a                            tortuous colon and fair prep. Successful completion                            of the procedure was aided by straightening and                            shortening the scope to obtain bowel loop reduction                            and lavage. The patient tolerated the procedure                            well. The quality of the bowel preparation was good                            except the ascending colon was poor and the cecum                            was poor. The ileocecal valve, appendiceal orifice,                            and rectum were photographed.  Scope In: 12:14:39 PM Scope Out: 12:33:32 PM Scope Withdrawal Time: 0 hours 14 minutes 54 seconds  Total Procedure Duration: 0 hours 18 minutes 53 seconds  Findings:      The perianal and digital rectal examinations were normal.      A 12 mm polyp was found in the ascending colon. The polyp was       semi-pedunculated. The polyp was removed with a hot snare. Resection and       retrieval were complete. Estimated blood loss: none.      Multiple small and large-mouthed diverticula were found in the sigmoid       colon.      Scattered amount of red and dark red blood in clots was found in the       sigmoid colon without active bleeding.      Internal hemorrhoids were found during retroflexion. The hemorrhoids       were small and Grade I (internal hemorrhoids that do not prolapse). Impression:               - One 12 mm polyp in the ascending colon, removed                             with a hot snare. Resected and retrieved.                           - Diverticulosis in the sigmoid colon.                           - Blood in the sigmoid colon.                           - Internal hemorrhoids. Moderate Sedation:      N/A- Per Anesthesia Care Recommendation:           - Patient has a contact number available for                            emergencies. The signs and symptoms of potential                            delayed complications were discussed with the                            patient. Return to normal activities tomorrow.                            Written discharge instructions were provided to the                            patient.                           - Soft diet.                           - Await pathology results.                           -  Repeat colonoscopy for surveillance based on                            pathology results.                           - Advance diet as tolerated to high fiber diet.                           - Hold Coumadin for another 5 days and then resume.                           - Post procedure medication orders were given. Procedure Code(s):        --- Professional ---                           [phone], Colonoscopy, flexible; with removal of                            tumor(s), polyp(s), or other lesion(s) by snare                            technique Diagnosis Code(s):        --- Professional ---                           K92.2, Gastrointestinal hemorrhage, unspecified                           K92.1, Melena (includes Hematochezia)                           D12.2, Benign neoplasm of ascending colon                           K64.0, First degree hemorrhoids                           K57.30, Diverticulosis of large intestine without                            perforation or abscess without bleeding CPT copyright 2016 American [employer] Association. All rights reserved. The codes documented in this report are  preliminary and upon coder review may  be revised to meet current compliance requirements. Lear Ng, MD 02/19/2017 12:48:42 PM This report has been signed electronically. Number of Addenda: 0

## 2017-02-19 NOTE — Progress Notes (Addendum)
PROGRESS NOTE    Austin Davis  VZC:[id number] DOB: 05-Jan-1941 DOA: 02/16/2017 PCP: [name], MD  Brief Narrative: 1yom PMH PAF on warfarin, no previous colonoscopy or bleeding presented with BRBPR. Uses NSAIDs 4-5 times/month. Admitted for GIB, suspect diverticular.   Assessment & Plan:   Principal Problem:   Rectal bleeding Active Problems:   Atrial fibrillation (HCC)   Long term (current) use of anticoagulants   Status post mitral valve annuloplasty and MAZE 2005   Essential tremor   GIB (gastrointestinal bleeding)   GIB,  Suspect diverticular bleed.  Hgb stable. -Status post colonoscopy showing findings consistent with possible diverticular bleed that has been resolved.  Patient had scattered red blood and small clots in the sigmoid colon.  Call ascending colon polyp was removed.  He needs a colonoscopy in 3-4 months to evaluate the proximal colon.  He had a poor prep proximal colon.  Plan to hold Coumadin for another 5 days and hopefully discharging home tomorrow if stable.  At this time he is tolerating soft diet with no complaints of abdominal pain or diarrhea PAF, MAZE, MVP s/p annuloplasty presented with BRBPR -Hold warfarin until 2/4/ 2019.     DVT prophylaxis: SCD Code Status: Full code Family Communication no family available Disposition Plan: TBD consultants: GI  Procedures: Colonoscopy Antimicrobials: None  Subjective: No new complaints just had food feels better.   Objective: Vitals:   02/19/17 1113 02/19/17 1240 02/19/17 1255 02/19/17 1312  BP: 130/80 117/84 129/67 125/78  Pulse:  70 (!) 59 61  Resp: 14 19 14 18   Temp: 98 F (36.7 C) 97.9 F (36.6 C)  97.7 F (36.5 C)  TempSrc: Oral Oral  Oral  SpO2: 97% 100% 100% 100%  Weight: 77.1 kg (170 lb)     Height: 5\' 8"  (1.727 m)       Intake/Output Summary (Last 24 hours) at 02/19/2017 1453 Last data filed at 02/19/2017 1242 Gross per 24 hour  Intake 1280 ml  Output 8 ml  Net 1272 ml    Filed Weights   02/16/17 1655 02/16/17 2157 02/19/17 1113  Weight: 77.6 kg (171 lb) 79.7 kg (175 lb 11.3 oz) 77.1 kg (170 lb)    Examination:  General exam: Appears calm and comfortable  Respiratory system: Clear to auscultation. Respiratory effort normal. Cardiovascular system: S1 & S2 heard, RRR. No JVD, murmurs, rubs, gallops or clicks. No pedal edema. Gastrointestinal system: Abdomen is nondistended, soft and nontender. No organomegaly or masses felt. Normal bowel sounds heard. Central nervous system: Alert and oriented. No focal neurological deficits. Extremities: Symmetric 5 x 5 power. Skin: No rashes, lesions or ulcers Psychiatry: Judgement and insight appear normal. Mood & affect appropriate.     Data Reviewed: I have personally reviewed following labs and imaging studies  CBC: Recent Labs  Lab 02/17/17 0145 02/17/17 1026 02/17/17 1459 02/18/17 0532 02/19/17 0523  WBC 7.9 8.2 7.4 6.2 5.6  HGB 12.7* 13.7 13.2 13.0 12.9*  HCT 38.1* 39.8 38.4* 38.1* 37.8*  MCV 89.2 90.2 89.1 89.9 90.0  PLT 193 196 194 173 128   Basic Metabolic Panel: Recent Labs  Lab 02/16/17 1659 02/17/17 0145  NA 138 137  K 4.5 4.0  CL 106 105  CO2 26 26  GLUCOSE 124* 98  BUN 26* 20  CREATININE 1.07 0.93  CALCIUM 9.5 8.6*   GFR: Estimated Creatinine Clearance: 65.4 mL/min (by C-G formula based on SCr of 0.93 mg/dL). Liver Function Tests: Recent Labs  Lab 02/16/17  1659  AST 33  ALT 36  ALKPHOS 75  BILITOT 0.5  PROT 7.1  ALBUMIN 4.2   No results for input(s): LIPASE, AMYLASE in the last 168 hours. No results for input(s): AMMONIA in the last 168 hours. Coagulation Profile: Recent Labs  Lab 02/16/17 1659 02/17/17 0145 02/18/17 0532  INR 2.00 1.97 1.49   Cardiac Enzymes: No results for input(s): CKTOTAL, CKMB, CKMBINDEX, TROPONINI in the last 168 hours. BNP (last 3 results) No results for input(s): PROBNP in the last 8760 hours. HbA1C: No results for input(s): HGBA1C  in the last 72 hours. CBG: Recent Labs  Lab 02/16/17 2201 02/17/17 0814 02/17/17 1143  GLUCAP 88 93 101*   Lipid Profile: No results for input(s): CHOL, HDL, LDLCALC, TRIG, CHOLHDL, LDLDIRECT in the last 72 hours. Thyroid Function Tests: No results for input(s): TSH, T4TOTAL, FREET4, T3FREE, THYROIDAB in the last 72 hours. Anemia Panel: No results for input(s): VITAMINB12, FOLATE, FERRITIN, TIBC, IRON, RETICCTPCT in the last 72 hours. Sepsis Labs: Recent Labs  Lab 02/16/17 1847  LATICACIDVEN 1.70    No results found for this or any previous visit (from the past 240 hour(s)).       Radiology Studies: No results found.      Scheduled Meds: . pantoprazole (PROTONIX) IV  40 mg Intravenous Q12H   Continuous Infusions: . sodium chloride 10 mL/hr at 02/17/17 2238     LOS: 2 days     Georgette Shell, MD [facility]  If 7PM-7AM, please contact night-coverage [email or web address] Password [facility] 02/19/2017, 2:53 PM

## 2017-02-20 ENCOUNTER — Encounter (HOSPITAL_COMMUNITY): Payer: Self-pay | Admitting: Gastroenterology

## 2017-02-20 LAB — CBC
HCT: 34.2 % — ABNORMAL LOW (ref 39.0–52.0)
Hemoglobin: 11.7 g/dL — ABNORMAL LOW (ref 13.0–17.0)
MCH: 30.6 pg (ref 26.0–34.0)
MCHC: 34.2 g/dL (ref 30.0–36.0)
MCV: 89.5 fL (ref 78.0–100.0)
Platelets: 182 10*3/uL (ref 150–400)
RBC: 3.82 MIL/uL — ABNORMAL LOW (ref 4.22–5.81)
RDW: 14.9 % (ref 11.5–15.5)
WBC: 7.8 10*3/uL (ref 4.0–10.5)

## 2017-02-20 MED ORDER — PANTOPRAZOLE SODIUM 40 MG IV SOLR: 40.0000 mg | Freq: Every day | INTRAVENOUS | 0 refills | Status: DC

## 2017-02-20 MED ORDER — PANTOPRAZOLE SODIUM 40 MG PO TBEC: 40.0000 mg | DELAYED_RELEASE_TABLET | Freq: Every day | ORAL | 1 refills | Status: DC

## 2017-02-20 MED ORDER — WARFARIN SODIUM 5 MG PO TABS: ORAL_TABLET | ORAL | 5 refills | Status: DC

## 2017-02-20 NOTE — Care Management Note (Signed)
Case Management Note  Patient Details  Name: Austin Davis MRN: [id number] Date of Birth: 08/22/1940  Subjective/Objective: 77 y/o m admitted w/Rectal bleed. Fromhome.                   Action/Plan:d/c plan home.   Expected Discharge Date:  02/20/17               Expected Discharge Plan:  Home/Self Care  In-House Referral:     Discharge planning Services  CM Consult  Post Acute Care Choice:    Choice offered to:     DME Arranged:    DME Agency:     HH Arranged:    HH Agency:     Status of Service:  Completed, signed off  If discussed at [employer] of Stay Meetings, dates discussed:    Additional Comments:  Dessa Phi, RN 02/20/2017, 11:06 AM

## 2017-02-20 NOTE — Discharge Summary (Addendum)
Physician Discharge Summary  Austin Davis ZTI:[id number] DOB: November 23, 1940 DOA: 02/16/2017  PCP: [name], MD  Admit date: 02/16/2017 Discharge date: 02/20/2017  Admitted From: Home Disposition: Home  recommendations for Outpatient Follow-up:  1. Follow up with PCP in 1-2 weeks 2. Please obtain BMP/CBC in one week  Home Health none Equipment/Devices none Discharge Condition: Stable CODE STATUS full code Diet recommendation: Cardiac diet Brief/Interim Summary: 43yom PMH PAF on warfarin, no previous colonoscopy or bleeding presented with BRBPR. Uses NSAIDs 4-5 times/month. Admitted for GIB, suspect diverticular.    Discharge Diagnoses:  Principal Problem:   Rectal bleeding Active Problems:   Atrial fibrillation (HCC)   Long term (current) use of anticoagulants   Status post mitral valve annuloplasty and MAZE 2005   Essential tremor   GIB (gastrointestinal bleeding)  GIB, Suspect diverticular bleed. Hgb stable. -Status post colonoscopy showing findings consistent with possible diverticular bleed that has been resolved.  Patient had scattered red blood and small clots in the sigmoid colon. ascending colon polyp was removed.  He needs a colonoscopy in 3-4 months to evaluate the proximal colon.  He had a poor prep proximal colon.  Plan to hold Coumadin for another 4 days and hopefully discharging home tomorrow if stable.  At this time he is tolerating soft diet with no complaints of abdominal pain or diarrhea PAF, MAZE, MVP s/p annuloplastypresented with BRBPR -Holdwarfarin until 2/4/ 2019.     Discharge Instructions  Discharge Instructions    Call MD for:  difficulty breathing, headache or visual disturbances   Complete by:  As directed    Call MD for:  persistant nausea and vomiting   Complete by:  As directed    Call MD for:  severe uncontrolled pain   Complete by:  As directed    Call MD for:  temperature >100.4   Complete by:  As directed    Diet -  low sodium heart healthy   Complete by:  As directed    Increase activity slowly   Complete by:  As directed      Allergies as of 02/20/2017      Reactions   Prednisone Other (See Comments)   Makes skin crawl      Medication List    STOP taking these medications   ibuprofen 200 MG tablet Commonly known as:  ADVIL,MOTRIN     TAKE these medications   acetaminophen 500 MG tablet Commonly known as:  TYLENOL Take 500 mg by mouth every 6 (six) hours as needed for mild pain or headache.   gabapentin 300 MG capsule Commonly known as:  NEURONTIN Take 1 capsule (300 mg total) by mouth 3 (three) times daily as needed.   multivitamin capsule Take 1 capsule by mouth daily.   omega-3 acid ethyl esters 1 g capsule Commonly known as:  LOVAZA Take 1 g by mouth 2 (two) times daily.   pantoprazole 40 MG injection Commonly known as:  PROTONIX Inject 40 mg into the vein at bedtime.   warfarin 5 MG tablet Commonly known as:  COUMADIN Take as directed. If you are unsure how to take this medication, talk to your nurse or doctor. Original instructions:  TAKE 1 TO 1 &amp; 1/2 TABLETS BY MOUTH EVERY DAY AS DIRECTED BY COUMADIN CLINIC What changed:  See the new instructions.      Follow-up Information    [name], MD Follow up.   Specialty:  Family Medicine Contact information: [place]        Wilford Corner, MD Follow up.   Specialty:  Gastroenterology Contact information: [address] [place] [address] [phone]          Allergies  Allergen Reactions  . Prednisone Other (See Comments)    Makes skin crawl    Consultations:  Dr Michail Sermon   Procedures/Studies: Ct Abdomen Pelvis W Contrast  Result Date: 02/16/2017 CLINICAL DATA:  Lower abdominal pain, melena EXAM: CT ABDOMEN AND PELVIS WITH CONTRAST TECHNIQUE: Multidetector CT imaging of the abdomen and pelvis was performed using the  standard protocol following bolus administration of intravenous contrast. CONTRAST:  136mL ISOVUE-300 IOPAMIDOL (ISOVUE-300) INJECTION 61% COMPARISON:  None. FINDINGS: Lower chest: No acute abnormality. Hepatobiliary: Scattered tiny hypodensities throughout the liver, likely small cysts. Gallbladder is contracted. Pancreas: No focal abnormality or ductal dilatation. Spleen: No focal abnormality.  Normal size. Adrenals/Urinary Tract: No adrenal abnormality. No focal renal abnormality. No stones or hydronephrosis. Urinary bladder is unremarkable. Stomach/Bowel: Scattered sigmoid diverticula. No active diverticulitis. Moderate stool in the colon. Liquid stool in the left colon which could reflect diarrhea. Stomach and small bowel decompressed, grossly unremarkable. Vascular/Lymphatic: No evidence of aneurysm or adenopathy. Reproductive: No visible focal abnormality. Other: No free fluid or free air. Musculoskeletal: No acute bony abnormality. Mild rightward scoliosis in the lumbar spine. IMPRESSION: Moderate stool in the colon. Few small scattered sigmoid diverticula. Scattered hypodensities in the liver, likely small cysts. Fluid levels in the left colon could reflect diarrhea. Electronically Signed   By: Rolm Baptise M.D.   On: 02/16/2017 20:30    (Echo, Carotid, EGD, Colonoscopy, ERCP)    Subjective:   Discharge Exam: Vitals:   02/19/17 2112 02/20/17 0541  BP: 100/73 121/70  Pulse: 77 66  Resp: 18 16  Temp: 97.8 F (36.6 C) 98 F (36.7 C)  SpO2: 98% 98%   Vitals:   02/19/17 1255 02/19/17 1312 02/19/17 2112 02/20/17 0541  BP: 129/67 125/78 100/73 121/70  Pulse: (!) 59 61 77 66  Resp: 14 18 18 16   Temp:  97.7 F (36.5 C) 97.8 F (36.6 C) 98 F (36.7 C)  TempSrc:  Oral Oral Oral  SpO2: 100% 100% 98% 98%  Weight:      Height:        General: Pt is alert, awake, not in acute distress Cardiovascular: RRR, S1/S2 +, no rubs, no gallops Respiratory: CTA bilaterally, no wheezing, no  rhonchi Abdominal: Soft, NT, ND, bowel sounds + Extremities: no edema, no cyanosis    The results of significant diagnostics from this hospitalization (including imaging, microbiology, ancillary and laboratory) are listed below for reference.     Microbiology: No results found for this or any previous visit (from the past 240 hour(s)).   Labs: BNP (last 3 results) No results for input(s): BNP in the last 8760 hours. Basic Metabolic Panel: Recent Labs  Lab 02/16/17 1659 02/17/17 0145  NA 138 137  K 4.5 4.0  CL 106 105  CO2 26 26  GLUCOSE 124* 98  BUN 26* 20  CREATININE 1.07 0.93  CALCIUM 9.5 8.6*   Liver Function Tests: Recent Labs  Lab 02/16/17 1659  AST 33  ALT 36  ALKPHOS 75  BILITOT 0.5  PROT 7.1  ALBUMIN 4.2   No results for input(s): LIPASE, AMYLASE in the last 168 hours. No results for input(s): AMMONIA in the last 168 hours. CBC: Recent Labs  Lab 02/17/17 1026 02/17/17 1459 02/18/17 0532 02/19/17 2202  02/20/17 0505  WBC 8.2 7.4 6.2 5.6 7.8  HGB 13.7 13.2 13.0 12.9* 11.7*  HCT 39.8 38.4* 38.1* 37.8* 34.2*  MCV 90.2 89.1 89.9 90.0 89.5  PLT 196 194 173 190 182   Cardiac Enzymes: No results for input(s): CKTOTAL, CKMB, CKMBINDEX, TROPONINI in the last 168 hours. BNP: Invalid input(s): POCBNP CBG: Recent Labs  Lab 02/16/17 2201 02/17/17 0814 02/17/17 1143  GLUCAP 88 93 101*   D-Dimer No results for input(s): DDIMER in the last 72 hours. Hgb A1c No results for input(s): HGBA1C in the last 72 hours. Lipid Profile No results for input(s): CHOL, HDL, LDLCALC, TRIG, CHOLHDL, LDLDIRECT in the last 72 hours. Thyroid function studies No results for input(s): TSH, T4TOTAL, T3FREE, THYROIDAB in the last 72 hours.  Invalid input(s): FREET3 Anemia work up No results for input(s): VITAMINB12, FOLATE, FERRITIN, TIBC, IRON, RETICCTPCT in the last 72 hours. Urinalysis    Component Value Date/Time   COLORURINE YELLOW 09/25/2006 1500   APPEARANCEUR  CLEAR 09/25/2006 1500   LABSPEC 1.015 09/25/2006 1500   PHURINE 7.5 09/25/2006 1500   GLUCOSEU NEGATIVE 09/25/2006 1500   HGBUR NEGATIVE 09/25/2006 1500   BILIRUBINUR NEGATIVE 09/25/2006 1500   KETONESUR NEGATIVE 09/25/2006 1500   PROTEINUR NEGATIVE 09/25/2006 1500   UROBILINOGEN 0.2 09/25/2006 1500   NITRITE NEGATIVE 09/25/2006 1500   LEUKOCYTESUR  09/25/2006 1500    NEGATIVE MICROSCOPIC NOT DONE ON URINES WITH NEGATIVE PROTEIN, BLOOD, LEUKOCYTES, NITRITE, OR GLUCOSE <1000 mg/dL.   Sepsis Labs Invalid input(s): PROCALCITONIN,  WBC,  LACTICIDVEN Microbiology No results found for this or any previous visit (from the past 240 hour(s)).   Time coordinating discharge: Over 30 minutes  SIGNED:   Georgette Shell, MD  [facility] 02/20/2017, 10:01 AM Pager   If 7PM-7AM, please contact night-coverage [email or web address] Password [email or web address]

## 2017-02-20 NOTE — Progress Notes (Signed)
Patient given discharge, follow up, and medication instructions including not resuming Coumadin until 02/24/17, verbalized understanding, IV removed, waiting on family to transport home

## 2017-02-20 NOTE — Care Management Important Message (Signed)
Important Message  Patient Details  Name: XAVYER STEENSON MRN: [id number] Date of Birth: June 23, 1940   Medicare Important Message Given:  Yes    Kerin Salen 02/20/2017, 11:10 AMImportant Message  Patient Details  Name: DEWAINE MOROCHO MRN: [id number] Date of Birth: [birth year]   Medicare Important Message Given:  Yes    Kerin Salen 02/20/2017, 11:10 AM

## 2017-02-20 NOTE — Progress Notes (Addendum)
Patient ID: Austin Davis, male   DOB: 05/22/1940, 77 y.o.   MRN: [id number]   [place] formed stool just now with a small amount of red stool noted. Denies abdominal pain.   Hgb 11.7 (12.9)  Resolved diverticular bleed - f/u on path of colon polyp as outpt. Will need a repeat colonoscopy in 3-6 months due to poor prep in proximal colon. Ok to go home today from GI standpoint. F/U with me in 3-4 weeks.

## 2017-02-24 ENCOUNTER — Telehealth: Payer: Self-pay | Admitting: Cardiovascular Disease

## 2017-02-24 ENCOUNTER — Other Ambulatory Visit: Payer: Self-pay | Admitting: Cardiovascular Disease

## 2017-02-24 NOTE — Telephone Encounter (Signed)
Austin Davis is calling because he had to go off of the coumadin for a week and wants to know should he go back on the same dosage or build himself back up . Also he is having diaherria not sure if that make a difference . Please call   Thanks

## 2017-02-25 NOTE — Telephone Encounter (Signed)
LMOM;  Do  Not boost dose. Please resume at stable dose, we will adjust dose as needed on 03/02/17 during INR check appoinment

## 2017-03-02 ENCOUNTER — Ambulatory Visit (INDEPENDENT_AMBULATORY_CARE_PROVIDER_SITE_OTHER): Payer: Medicare Other | Admitting: Pharmacist Clinician (PhC)/ Clinical Pharmacy Specialist

## 2017-03-02 DIAGNOSIS — I4891 Unspecified atrial fibrillation: Secondary | ICD-10-CM

## 2017-03-02 DIAGNOSIS — Z7901 Long term (current) use of anticoagulants: Secondary | ICD-10-CM | POA: Diagnosis not present

## 2017-03-02 LAB — POCT INR: INR: 1.1

## 2017-03-10 DIAGNOSIS — R899 Unspecified abnormal finding in specimens from other organs, systems and tissues: Secondary | ICD-10-CM | POA: Diagnosis not present

## 2017-03-10 DIAGNOSIS — M25519 Pain in unspecified shoulder: Secondary | ICD-10-CM | POA: Diagnosis not present

## 2017-03-10 DIAGNOSIS — R140 Abdominal distension (gaseous): Secondary | ICD-10-CM | POA: Diagnosis not present

## 2017-03-10 DIAGNOSIS — R252 Cramp and spasm: Secondary | ICD-10-CM | POA: Diagnosis not present

## 2017-03-10 DIAGNOSIS — K922 Gastrointestinal hemorrhage, unspecified: Secondary | ICD-10-CM | POA: Diagnosis not present

## 2017-03-11 ENCOUNTER — Ambulatory Visit (INDEPENDENT_AMBULATORY_CARE_PROVIDER_SITE_OTHER): Payer: Medicare Other | Admitting: Pharmacist Clinician (PhC)/ Clinical Pharmacy Specialist

## 2017-03-11 DIAGNOSIS — Z7901 Long term (current) use of anticoagulants: Secondary | ICD-10-CM

## 2017-03-11 DIAGNOSIS — I4891 Unspecified atrial fibrillation: Secondary | ICD-10-CM | POA: Diagnosis not present

## 2017-03-11 LAB — POCT INR: INR: 2.4

## 2017-03-11 NOTE — Patient Instructions (Signed)
Description   Continue taking 1 tablet every day. Repeat INR in 3 week

## 2017-04-10 DIAGNOSIS — M25511 Pain in right shoulder: Secondary | ICD-10-CM | POA: Diagnosis not present

## 2017-04-13 ENCOUNTER — Ambulatory Visit (INDEPENDENT_AMBULATORY_CARE_PROVIDER_SITE_OTHER): Payer: Medicare Other | Admitting: Pharmacist Clinician (PhC)/ Clinical Pharmacy Specialist

## 2017-04-13 DIAGNOSIS — I4891 Unspecified atrial fibrillation: Secondary | ICD-10-CM

## 2017-04-13 DIAGNOSIS — Z7901 Long term (current) use of anticoagulants: Secondary | ICD-10-CM | POA: Diagnosis not present

## 2017-04-13 LAB — POCT INR: INR: 1.6

## 2017-04-13 NOTE — Patient Instructions (Signed)
Description   Take 1.5 tablets today Monday March 25, then take 1 tablet daily except 1/2 tablet each Sunday.  Repeat INR in 2 weeks (will do 4 wks, pt out of town)

## 2017-05-11 ENCOUNTER — Ambulatory Visit (INDEPENDENT_AMBULATORY_CARE_PROVIDER_SITE_OTHER): Payer: Medicare Other | Admitting: Pharmacist

## 2017-05-11 DIAGNOSIS — I4891 Unspecified atrial fibrillation: Secondary | ICD-10-CM | POA: Diagnosis not present

## 2017-05-11 DIAGNOSIS — Z7901 Long term (current) use of anticoagulants: Secondary | ICD-10-CM | POA: Diagnosis not present

## 2017-05-11 LAB — POCT INR: INR: 2.1

## 2017-06-08 ENCOUNTER — Ambulatory Visit (INDEPENDENT_AMBULATORY_CARE_PROVIDER_SITE_OTHER): Payer: Medicare Other | Admitting: Pharmacist

## 2017-06-08 DIAGNOSIS — Z7901 Long term (current) use of anticoagulants: Secondary | ICD-10-CM

## 2017-06-08 DIAGNOSIS — I4891 Unspecified atrial fibrillation: Secondary | ICD-10-CM | POA: Diagnosis not present

## 2017-06-08 LAB — POCT INR: INR: 2.9

## 2017-07-06 ENCOUNTER — Telehealth: Payer: Self-pay | Admitting: Cardiovascular Disease

## 2017-07-06 NOTE — Telephone Encounter (Signed)
LMTCB   Per chart review: mitral valve repair and Maze procedure by Dr. Prescott Gum in 2005 for mitral valve prolapse with severe mitral regurgitation   ** he would meet criteria for abx prophylaxis for dental procedure

## 2017-07-06 NOTE — Telephone Encounter (Signed)
Pt calling and needing to know if he need to take an antibiotic before dental procedure. Please advise pt.

## 2017-07-06 NOTE — Telephone Encounter (Signed)
Returned call to patient, explained per triage protocol he should take abx prophyalxis prior to dental work. He has taken clindamycin in the past. He does not need an Rx. He was concerned as he thought were was some difference of opinion on the use of abx, but explained per our protocol, he would require such since he has had his mitral valve repaired

## 2017-07-06 NOTE — Telephone Encounter (Signed)
New Message: ° ° ° ° ° ° °Pt is returning a call °

## 2017-07-20 ENCOUNTER — Ambulatory Visit: Payer: Medicare Other | Admitting: Pharmacist

## 2017-07-20 DIAGNOSIS — Z7901 Long term (current) use of anticoagulants: Secondary | ICD-10-CM | POA: Diagnosis not present

## 2017-07-20 DIAGNOSIS — I4891 Unspecified atrial fibrillation: Secondary | ICD-10-CM

## 2017-07-20 LAB — POCT INR: INR: 2.3 (ref 2.0–3.0)

## 2017-07-30 ENCOUNTER — Other Ambulatory Visit: Payer: Self-pay | Admitting: Cardiovascular Disease

## 2017-08-31 ENCOUNTER — Ambulatory Visit (INDEPENDENT_AMBULATORY_CARE_PROVIDER_SITE_OTHER): Payer: Medicare Other | Admitting: Pharmacist

## 2017-08-31 DIAGNOSIS — Z7901 Long term (current) use of anticoagulants: Secondary | ICD-10-CM | POA: Diagnosis not present

## 2017-08-31 DIAGNOSIS — I4891 Unspecified atrial fibrillation: Secondary | ICD-10-CM

## 2017-08-31 LAB — POCT INR: INR: 3 (ref 2.0–3.0)

## 2017-10-12 ENCOUNTER — Ambulatory Visit (INDEPENDENT_AMBULATORY_CARE_PROVIDER_SITE_OTHER): Payer: Medicare Other | Admitting: Pharmacist

## 2017-10-12 DIAGNOSIS — Z7901 Long term (current) use of anticoagulants: Secondary | ICD-10-CM | POA: Diagnosis not present

## 2017-10-12 DIAGNOSIS — I4891 Unspecified atrial fibrillation: Secondary | ICD-10-CM

## 2017-10-12 LAB — POCT INR: INR: 2.1 (ref 2.0–3.0)

## 2017-10-14 DIAGNOSIS — H04123 Dry eye syndrome of bilateral lacrimal glands: Secondary | ICD-10-CM | POA: Diagnosis not present

## 2017-10-14 DIAGNOSIS — H25813 Combined forms of age-related cataract, bilateral: Secondary | ICD-10-CM | POA: Diagnosis not present

## 2017-10-14 DIAGNOSIS — H33313 Horseshoe tear of retina without detachment, bilateral: Secondary | ICD-10-CM | POA: Diagnosis not present

## 2017-10-14 DIAGNOSIS — H31093 Other chorioretinal scars, bilateral: Secondary | ICD-10-CM | POA: Diagnosis not present

## 2017-10-14 DIAGNOSIS — H10413 Chronic giant papillary conjunctivitis, bilateral: Secondary | ICD-10-CM | POA: Diagnosis not present

## 2017-11-06 DIAGNOSIS — Z136 Encounter for screening for cardiovascular disorders: Secondary | ICD-10-CM | POA: Diagnosis not present

## 2017-11-06 DIAGNOSIS — Z13220 Encounter for screening for lipoid disorders: Secondary | ICD-10-CM | POA: Diagnosis not present

## 2017-11-06 DIAGNOSIS — R899 Unspecified abnormal finding in specimens from other organs, systems and tissues: Secondary | ICD-10-CM | POA: Diagnosis not present

## 2017-11-09 DIAGNOSIS — Z0000 Encounter for general adult medical examination without abnormal findings: Secondary | ICD-10-CM | POA: Diagnosis not present

## 2017-11-09 DIAGNOSIS — G43909 Migraine, unspecified, not intractable, without status migrainosus: Secondary | ICD-10-CM | POA: Diagnosis not present

## 2017-11-09 DIAGNOSIS — R413 Other amnesia: Secondary | ICD-10-CM | POA: Diagnosis not present

## 2017-11-09 DIAGNOSIS — R51 Headache: Secondary | ICD-10-CM | POA: Diagnosis not present

## 2017-11-09 DIAGNOSIS — R400 Somnolence: Secondary | ICD-10-CM | POA: Diagnosis not present

## 2017-11-09 DIAGNOSIS — R252 Cramp and spasm: Secondary | ICD-10-CM | POA: Diagnosis not present

## 2017-11-09 DIAGNOSIS — R251 Tremor, unspecified: Secondary | ICD-10-CM | POA: Diagnosis not present

## 2017-11-09 DIAGNOSIS — Z23 Encounter for immunization: Secondary | ICD-10-CM | POA: Diagnosis not present

## 2017-11-23 ENCOUNTER — Ambulatory Visit (INDEPENDENT_AMBULATORY_CARE_PROVIDER_SITE_OTHER): Payer: Medicare Other | Admitting: Pharmacist Clinician (PhC)/ Clinical Pharmacy Specialist

## 2017-11-23 DIAGNOSIS — I4891 Unspecified atrial fibrillation: Secondary | ICD-10-CM | POA: Diagnosis not present

## 2017-11-23 DIAGNOSIS — Z7901 Long term (current) use of anticoagulants: Secondary | ICD-10-CM | POA: Diagnosis not present

## 2017-11-23 LAB — POCT INR: INR: 2.4 (ref 2.0–3.0)

## 2018-01-04 ENCOUNTER — Ambulatory Visit (INDEPENDENT_AMBULATORY_CARE_PROVIDER_SITE_OTHER): Payer: Medicare Other | Admitting: Pharmacist

## 2018-01-04 DIAGNOSIS — Z7901 Long term (current) use of anticoagulants: Secondary | ICD-10-CM

## 2018-01-04 DIAGNOSIS — I4891 Unspecified atrial fibrillation: Secondary | ICD-10-CM

## 2018-01-04 LAB — POCT INR: INR: 2.2 (ref 2.0–3.0)

## 2018-02-11 DIAGNOSIS — J069 Acute upper respiratory infection, unspecified: Secondary | ICD-10-CM | POA: Diagnosis not present

## 2018-02-15 ENCOUNTER — Ambulatory Visit (INDEPENDENT_AMBULATORY_CARE_PROVIDER_SITE_OTHER): Payer: Medicare Other | Admitting: Pharmacist Clinician (PhC)/ Clinical Pharmacy Specialist

## 2018-02-15 ENCOUNTER — Encounter (INDEPENDENT_AMBULATORY_CARE_PROVIDER_SITE_OTHER): Payer: Self-pay

## 2018-02-15 DIAGNOSIS — Z7901 Long term (current) use of anticoagulants: Secondary | ICD-10-CM

## 2018-02-15 DIAGNOSIS — I4891 Unspecified atrial fibrillation: Secondary | ICD-10-CM

## 2018-02-15 LAB — POCT INR: INR: 2.5 (ref 2.0–3.0)

## 2018-04-28 ENCOUNTER — Telehealth: Payer: Self-pay | Admitting: Pharmacist

## 2018-04-28 NOTE — Telephone Encounter (Signed)
Patient overdue to INR check.  LMOM; we are offering a drive-up service at [facility] location. Patient to call back ans schedule f/u appt ASAS.

## 2018-06-18 ENCOUNTER — Other Ambulatory Visit: Payer: Self-pay | Admitting: Cardiology

## 2018-06-22 NOTE — Telephone Encounter (Signed)
INR overdue. Still waiting for patient call back

## 2018-06-24 ENCOUNTER — Telehealth: Payer: Self-pay

## 2018-06-24 NOTE — Telephone Encounter (Signed)
lmomed pt to get on the coumadin schedule await callback

## 2018-08-04 ENCOUNTER — Telehealth: Payer: Self-pay

## 2018-08-04 NOTE — Telephone Encounter (Signed)
Called and lmomed overdue

## 2018-08-11 ENCOUNTER — Telehealth: Payer: Self-pay

## 2018-08-11 NOTE — Telephone Encounter (Signed)
LMOM FOR OVERDUE APPT

## 2018-08-18 ENCOUNTER — Ambulatory Visit (INDEPENDENT_AMBULATORY_CARE_PROVIDER_SITE_OTHER): Payer: Medicare Other | Admitting: Pharmacist

## 2018-08-18 ENCOUNTER — Other Ambulatory Visit: Payer: Self-pay

## 2018-08-18 ENCOUNTER — Other Ambulatory Visit: Payer: Self-pay | Admitting: Pharmacist

## 2018-08-18 DIAGNOSIS — I4891 Unspecified atrial fibrillation: Secondary | ICD-10-CM | POA: Diagnosis not present

## 2018-08-18 DIAGNOSIS — Z7901 Long term (current) use of anticoagulants: Secondary | ICD-10-CM | POA: Diagnosis not present

## 2018-08-18 LAB — POCT INR: INR: 2.8 (ref 2.0–3.0)

## 2018-08-18 MED ORDER — WARFARIN SODIUM 5 MG PO TABS: ORAL_TABLET | ORAL | 0 refills | Status: DC

## 2018-10-10 ENCOUNTER — Other Ambulatory Visit: Payer: Self-pay | Admitting: Cardiovascular Disease

## 2018-10-12 DIAGNOSIS — Z23 Encounter for immunization: Secondary | ICD-10-CM | POA: Diagnosis not present

## 2018-10-13 ENCOUNTER — Other Ambulatory Visit: Payer: Self-pay

## 2018-10-13 ENCOUNTER — Ambulatory Visit (INDEPENDENT_AMBULATORY_CARE_PROVIDER_SITE_OTHER): Payer: Medicare Other | Admitting: Pharmacist Clinician (PhC)/ Clinical Pharmacy Specialist

## 2018-10-13 DIAGNOSIS — I4891 Unspecified atrial fibrillation: Secondary | ICD-10-CM

## 2018-10-13 DIAGNOSIS — Z7901 Long term (current) use of anticoagulants: Secondary | ICD-10-CM | POA: Diagnosis not present

## 2018-10-13 LAB — POCT INR: INR: 2.2 (ref 2.0–3.0)

## 2018-12-02 ENCOUNTER — Other Ambulatory Visit: Payer: Self-pay | Admitting: Cardiovascular Disease

## 2018-12-08 ENCOUNTER — Other Ambulatory Visit: Payer: Self-pay

## 2018-12-08 ENCOUNTER — Ambulatory Visit (INDEPENDENT_AMBULATORY_CARE_PROVIDER_SITE_OTHER): Payer: Medicare Other | Admitting: Pharmacist

## 2018-12-08 DIAGNOSIS — Z7901 Long term (current) use of anticoagulants: Secondary | ICD-10-CM

## 2018-12-08 DIAGNOSIS — I4891 Unspecified atrial fibrillation: Secondary | ICD-10-CM

## 2018-12-08 LAB — POCT INR: INR: 2.3 (ref 2.0–3.0)

## 2018-12-23 DIAGNOSIS — Z0000 Encounter for general adult medical examination without abnormal findings: Secondary | ICD-10-CM | POA: Diagnosis not present

## 2018-12-23 DIAGNOSIS — R252 Cramp and spasm: Secondary | ICD-10-CM | POA: Diagnosis not present

## 2018-12-23 DIAGNOSIS — R5381 Other malaise: Secondary | ICD-10-CM | POA: Diagnosis not present

## 2018-12-23 DIAGNOSIS — R251 Tremor, unspecified: Secondary | ICD-10-CM | POA: Diagnosis not present

## 2018-12-23 DIAGNOSIS — Z23 Encounter for immunization: Secondary | ICD-10-CM | POA: Diagnosis not present

## 2018-12-23 DIAGNOSIS — Z1159 Encounter for screening for other viral diseases: Secondary | ICD-10-CM | POA: Diagnosis not present

## 2019-01-21 DIAGNOSIS — M8580 Other specified disorders of bone density and structure, unspecified site: Secondary | ICD-10-CM

## 2019-01-21 HISTORY — DX: Other specified disorders of bone density and structure, unspecified site: M85.80

## 2019-01-24 ENCOUNTER — Other Ambulatory Visit: Payer: Self-pay | Admitting: Cardiovascular Disease

## 2019-02-02 ENCOUNTER — Encounter: Payer: Self-pay | Admitting: Pharmacist Clinician (PhC)/ Clinical Pharmacy Specialist

## 2019-02-02 ENCOUNTER — Ambulatory Visit (INDEPENDENT_AMBULATORY_CARE_PROVIDER_SITE_OTHER): Payer: Medicare Other | Admitting: Pharmacist Clinician (PhC)/ Clinical Pharmacy Specialist

## 2019-02-02 ENCOUNTER — Other Ambulatory Visit: Payer: Self-pay

## 2019-02-02 DIAGNOSIS — I4891 Unspecified atrial fibrillation: Secondary | ICD-10-CM

## 2019-02-02 DIAGNOSIS — Z7901 Long term (current) use of anticoagulants: Secondary | ICD-10-CM

## 2019-02-02 LAB — POCT INR: INR: 1.9 — AB (ref 2.0–3.0)

## 2019-03-24 ENCOUNTER — Other Ambulatory Visit: Payer: Self-pay | Admitting: Cardiovascular Disease

## 2019-03-30 ENCOUNTER — Ambulatory Visit (INDEPENDENT_AMBULATORY_CARE_PROVIDER_SITE_OTHER): Payer: Medicare Other | Admitting: Pharmacist

## 2019-03-30 ENCOUNTER — Other Ambulatory Visit: Payer: Self-pay

## 2019-03-30 DIAGNOSIS — Z7901 Long term (current) use of anticoagulants: Secondary | ICD-10-CM

## 2019-03-30 DIAGNOSIS — I4891 Unspecified atrial fibrillation: Secondary | ICD-10-CM | POA: Diagnosis not present

## 2019-03-30 LAB — POCT INR: INR: 1.8 — AB (ref 2.0–3.0)

## 2019-04-27 ENCOUNTER — Ambulatory Visit (INDEPENDENT_AMBULATORY_CARE_PROVIDER_SITE_OTHER): Payer: Medicare Other | Admitting: Pharmacist Clinician (PhC)/ Clinical Pharmacy Specialist

## 2019-04-27 ENCOUNTER — Other Ambulatory Visit: Payer: Self-pay

## 2019-04-27 DIAGNOSIS — Z7901 Long term (current) use of anticoagulants: Secondary | ICD-10-CM | POA: Diagnosis not present

## 2019-04-27 DIAGNOSIS — I4891 Unspecified atrial fibrillation: Secondary | ICD-10-CM | POA: Diagnosis not present

## 2019-04-27 LAB — POCT INR: INR: 2.8 (ref 2.0–3.0)

## 2019-05-18 ENCOUNTER — Other Ambulatory Visit: Payer: Self-pay | Admitting: Cardiovascular Disease

## 2019-05-25 DIAGNOSIS — M62830 Muscle spasm of back: Secondary | ICD-10-CM | POA: Diagnosis not present

## 2019-05-25 DIAGNOSIS — M546 Pain in thoracic spine: Secondary | ICD-10-CM | POA: Diagnosis not present

## 2019-06-08 ENCOUNTER — Ambulatory Visit (INDEPENDENT_AMBULATORY_CARE_PROVIDER_SITE_OTHER): Payer: Medicare Other | Admitting: Pharmacist

## 2019-06-08 ENCOUNTER — Other Ambulatory Visit: Payer: Self-pay

## 2019-06-08 DIAGNOSIS — Z7901 Long term (current) use of anticoagulants: Secondary | ICD-10-CM

## 2019-06-08 DIAGNOSIS — I4891 Unspecified atrial fibrillation: Secondary | ICD-10-CM

## 2019-06-08 LAB — POCT INR: INR: 3.6 — AB (ref 2.0–3.0)

## 2019-06-08 NOTE — Patient Instructions (Signed)
Description   Hold warfarin tonight and take 1/2 tablet tomorrow.  Then continue taking 1 tablet daily.  Repeat INR in 2 weeks.

## 2019-06-27 ENCOUNTER — Other Ambulatory Visit: Payer: Self-pay

## 2019-06-27 ENCOUNTER — Ambulatory Visit (INDEPENDENT_AMBULATORY_CARE_PROVIDER_SITE_OTHER): Payer: Medicare Other | Admitting: Pharmacist Clinician (PhC)/ Clinical Pharmacy Specialist

## 2019-06-27 DIAGNOSIS — Z7901 Long term (current) use of anticoagulants: Secondary | ICD-10-CM | POA: Diagnosis not present

## 2019-06-27 DIAGNOSIS — I4891 Unspecified atrial fibrillation: Secondary | ICD-10-CM

## 2019-06-27 LAB — POCT INR: INR: 3.4 — AB (ref 2.0–3.0)

## 2019-06-27 NOTE — Patient Instructions (Signed)
Decrease dose to 1 tablet daily except 1/2 tablet each Monday.  Repeat INR in 3 weeks.

## 2019-07-16 ENCOUNTER — Other Ambulatory Visit: Payer: Self-pay | Admitting: Cardiovascular Disease

## 2019-07-18 ENCOUNTER — Ambulatory Visit (INDEPENDENT_AMBULATORY_CARE_PROVIDER_SITE_OTHER): Payer: Medicare Other | Admitting: Pharmacist

## 2019-07-18 ENCOUNTER — Other Ambulatory Visit: Payer: Self-pay

## 2019-07-18 DIAGNOSIS — I4891 Unspecified atrial fibrillation: Secondary | ICD-10-CM

## 2019-07-18 DIAGNOSIS — Z7901 Long term (current) use of anticoagulants: Secondary | ICD-10-CM

## 2019-07-18 LAB — POCT INR: INR: 2.5 (ref 2.0–3.0)

## 2019-08-15 DIAGNOSIS — R252 Cramp and spasm: Secondary | ICD-10-CM | POA: Diagnosis not present

## 2019-08-15 DIAGNOSIS — R251 Tremor, unspecified: Secondary | ICD-10-CM | POA: Diagnosis not present

## 2019-08-15 DIAGNOSIS — M40209 Unspecified kyphosis, site unspecified: Secondary | ICD-10-CM | POA: Diagnosis not present

## 2019-08-15 DIAGNOSIS — M791 Myalgia, unspecified site: Secondary | ICD-10-CM | POA: Diagnosis not present

## 2019-08-15 DIAGNOSIS — D6869 Other thrombophilia: Secondary | ICD-10-CM | POA: Diagnosis not present

## 2019-08-15 DIAGNOSIS — R5381 Other malaise: Secondary | ICD-10-CM | POA: Diagnosis not present

## 2019-08-15 DIAGNOSIS — R634 Abnormal weight loss: Secondary | ICD-10-CM | POA: Diagnosis not present

## 2019-08-15 DIAGNOSIS — I480 Paroxysmal atrial fibrillation: Secondary | ICD-10-CM | POA: Diagnosis not present

## 2019-08-29 ENCOUNTER — Ambulatory Visit (INDEPENDENT_AMBULATORY_CARE_PROVIDER_SITE_OTHER): Payer: Medicare Other

## 2019-08-29 ENCOUNTER — Other Ambulatory Visit: Payer: Self-pay

## 2019-08-29 DIAGNOSIS — Z7901 Long term (current) use of anticoagulants: Secondary | ICD-10-CM | POA: Diagnosis not present

## 2019-08-29 LAB — POCT INR: INR: 2.5 (ref 2.0–3.0)

## 2019-08-29 NOTE — Patient Instructions (Signed)
Take 1 tablet daily except 1/2 tablet each Sunday  Repeat INR in 6 weeks.

## 2019-09-02 ENCOUNTER — Other Ambulatory Visit: Payer: Self-pay | Admitting: Family Medicine

## 2019-09-02 DIAGNOSIS — R634 Abnormal weight loss: Secondary | ICD-10-CM

## 2019-09-02 DIAGNOSIS — Z13820 Encounter for screening for osteoporosis: Secondary | ICD-10-CM

## 2019-09-02 DIAGNOSIS — M8580 Other specified disorders of bone density and structure, unspecified site: Secondary | ICD-10-CM

## 2019-09-02 DIAGNOSIS — M810 Age-related osteoporosis without current pathological fracture: Secondary | ICD-10-CM

## 2019-09-02 DIAGNOSIS — Z139 Encounter for screening, unspecified: Secondary | ICD-10-CM

## 2019-09-02 DIAGNOSIS — D6869 Other thrombophilia: Secondary | ICD-10-CM

## 2019-09-02 DIAGNOSIS — M791 Myalgia, unspecified site: Secondary | ICD-10-CM

## 2019-09-02 DIAGNOSIS — M40209 Unspecified kyphosis, site unspecified: Secondary | ICD-10-CM

## 2019-09-02 DIAGNOSIS — R5381 Other malaise: Secondary | ICD-10-CM

## 2019-09-02 DIAGNOSIS — R252 Cramp and spasm: Secondary | ICD-10-CM

## 2019-09-16 ENCOUNTER — Other Ambulatory Visit: Payer: Self-pay | Admitting: Cardiovascular Disease

## 2019-09-21 ENCOUNTER — Other Ambulatory Visit: Payer: Self-pay

## 2019-09-21 ENCOUNTER — Ambulatory Visit (INDEPENDENT_AMBULATORY_CARE_PROVIDER_SITE_OTHER): Payer: Medicare Other

## 2019-09-21 DIAGNOSIS — M40209 Unspecified kyphosis, site unspecified: Secondary | ICD-10-CM | POA: Diagnosis not present

## 2019-09-21 DIAGNOSIS — M85852 Other specified disorders of bone density and structure, left thigh: Secondary | ICD-10-CM | POA: Diagnosis not present

## 2019-09-21 DIAGNOSIS — M810 Age-related osteoporosis without current pathological fracture: Secondary | ICD-10-CM | POA: Diagnosis not present

## 2019-09-21 IMAGING — OT DEXA BONE DENSITY STUDY HL7
1 series · 4 of 4 positions shown · non-contrast
Comparison: none

EXAM:
DUAL X-RAY ABSORPTIOMETRY (DXA) FOR BONE MINERAL DENSITY

[Series 1: dxa reports · 4 of 5 slices shown]
[im 1/5]
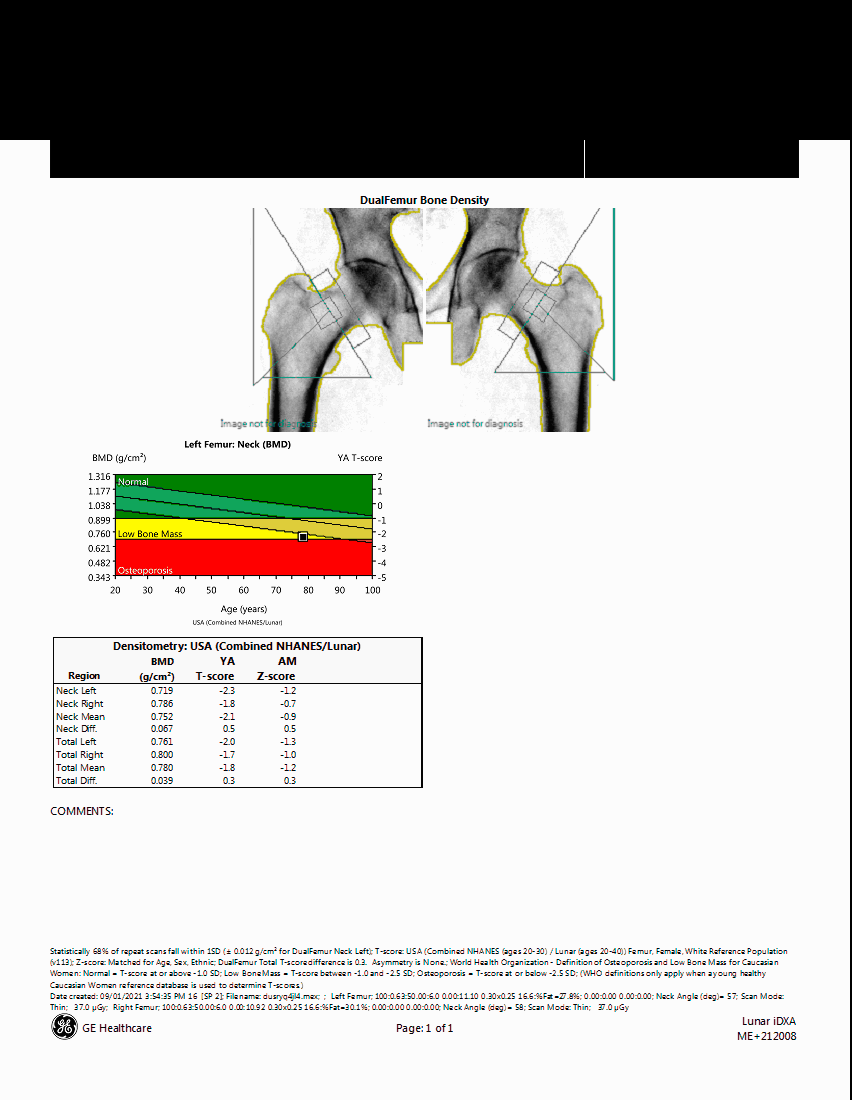
[im 2/5]
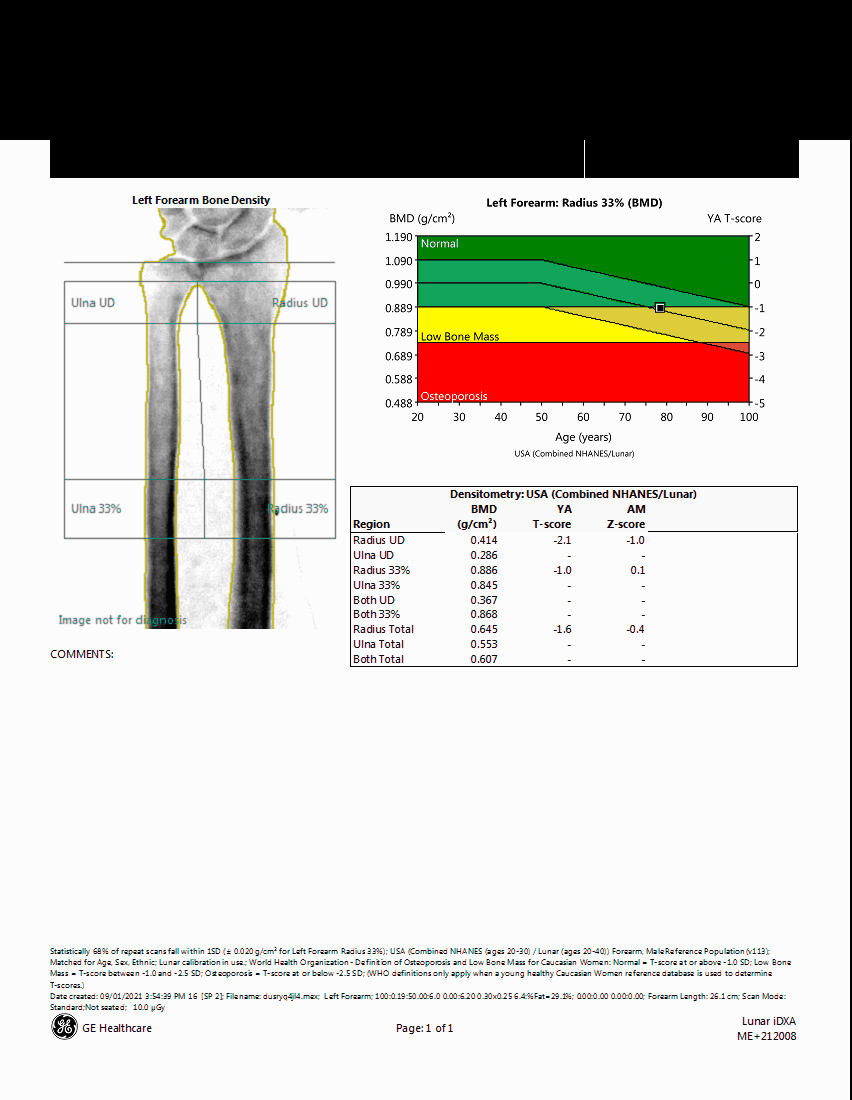
[im 3/5]
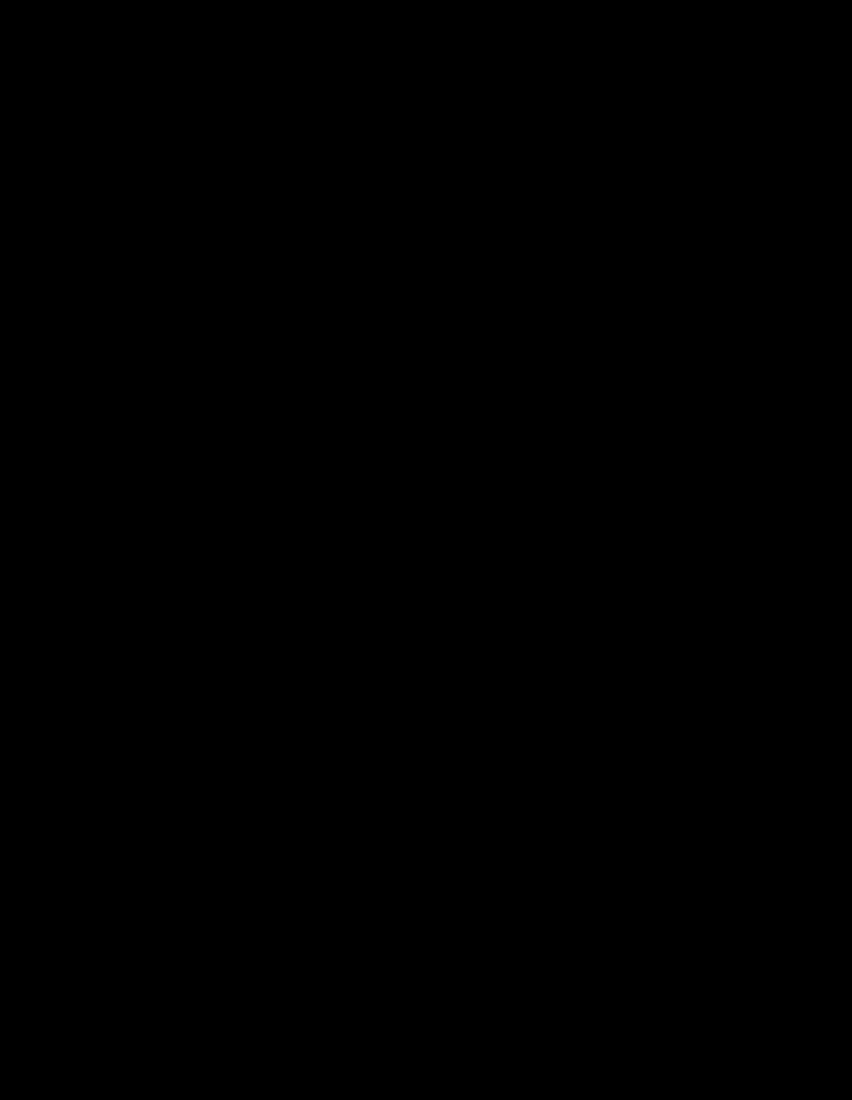
[im 5/5]
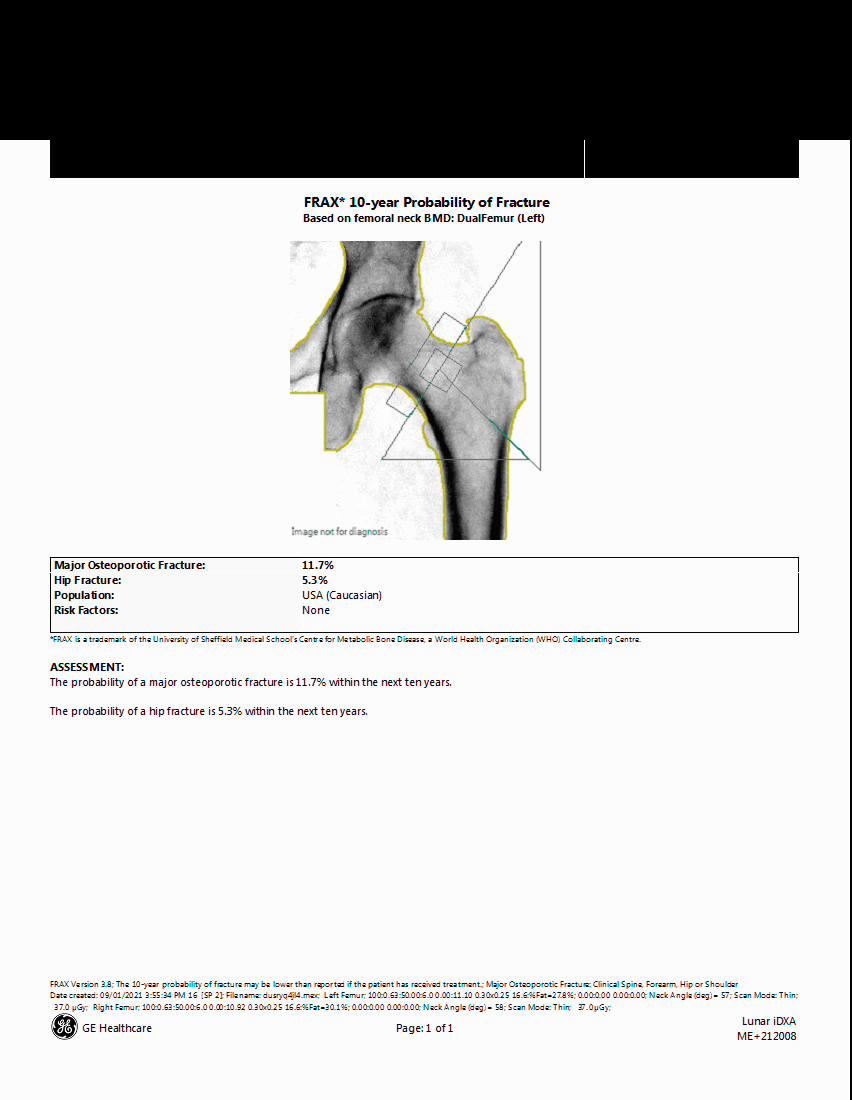

[4 of 4 positions shown; findings below may reference images not displayed]

IMPRESSION: AWA

Your patient AWA completed a BMD test on [DATE] using
the Lunar IDXA DXA System (analysis version: 16.SP2) manufactured by
[employer]. The following summarizes the results of our
evaluation. [facility]

PATIENT:
Name: AWA
Gender: Male Measured: [DATE] Weight: 152.2 lbs.
Indications: Advanced Age, Caucasian Fractures: Cervical Treatments:
Calcium, Vitamin D

ASSESSMENT:
The BMD measured at Femur Neck Left is 0.719 g/cm2 with a T-score of
-2.3.

Patient is considered OSTEOPENIC according to the World Health
Organization (WHO) criteria.

Lumbar spine was not utilized due to advanced degenerative changes.

The scan quality is good.

Site Region Measured Date Measured Age WHO YA BMD
Classification T-score

DualFemur Neck Left [DATE] 78.6 years Low Bone Mass -2.3
g/cm2

Left Forearm Radius 33% [DATE] 78.6 Normal -1.0 0.886 g/cm2

World Health Organization (WHO) criteria for post-menopausal,
Caucasian Women:
Normal        T-score at or above -1 SD
Low Bone Mass T-score between -1 and -2.5 SD
Osteoporosis  T-score at or below -2.5 SD

RECOMMENDATION:
1. All patients should optimize calcium and vitamin D intake.
2. Consider FDA-approved medical therapies in postmenopausal women
and men age 50 years and older, based on the following:
a. A hip or vertebral(clinical or morphometric) fracture
b. T-score < -2.5 at the femoral neck or spine after appropriate
evaluation to exclude secondary causes
c. Low bone mass (T-score between -1.0 and -2.5 at the femoral neck
or spine) and a 10-year probability of a hip fracture > 3% or a
10-year probability of a major osteoporosis-related fracture > 20%
based on the [place]-adapted WHO algorithm
d. Clinician judgement and/or patient preferences may indicate
treatment for people with 10-year fracture probabilities above or
below these levels

FOLLOW-UP:
People with diagnosed cases of osteoporosis or osteopenia should be
regularly tested for bone mineral density. For patients eligible for
Medicare, routine testing is allowed once every 2 years. The testing
frequency can be increased to one year for patients who have rapidly
progressing disease, or for those who are receiving medical therapy
to restore bone mass.

I have reviewed this report, and agree with the above findings

Patient: AWA Referring Physician: AWA
(16 SP 2)
Gender: Male Ethnicity: White Analyzed: [DATE] [DATE] (16 SP
2)

FRAX* 10-year Probability of Fracture
Based on femoral neck BMD: DualFemur (Left)

Major Osteoporotic Fracture: 11.7%
Hip Fracture:                5.3%
Population:                  [place] (Caucasian)
Risk Factors:                None

*FRAX is a trademark of the [REDACTED] [employer], a World Health Organization (WHO)
Collaborating Centre.

ASSESSMENT:
The probability of a major osteoporotic fracture is 11.7% within the
next ten years.
The probability of a hip fracture is 5.3% within the next ten years.

I have reviewed this report and agree with the above findings.

## 2019-09-29 ENCOUNTER — Other Ambulatory Visit: Payer: Self-pay | Admitting: Cardiovascular Disease

## 2019-09-29 ENCOUNTER — Ambulatory Visit (INDEPENDENT_AMBULATORY_CARE_PROVIDER_SITE_OTHER): Payer: Medicare Other | Admitting: Cardiovascular Disease

## 2019-09-29 ENCOUNTER — Telehealth: Payer: Self-pay | Admitting: *Deleted

## 2019-09-29 ENCOUNTER — Encounter: Payer: Self-pay | Admitting: Cardiovascular Disease

## 2019-09-29 ENCOUNTER — Other Ambulatory Visit: Payer: Self-pay

## 2019-09-29 VITALS — BP 133/88 | HR 89 | Ht 65.0 in | Wt 154.8 lb

## 2019-09-29 DIAGNOSIS — G250 Essential tremor: Secondary | ICD-10-CM | POA: Diagnosis not present

## 2019-09-29 DIAGNOSIS — I059 Rheumatic mitral valve disease, unspecified: Secondary | ICD-10-CM | POA: Diagnosis not present

## 2019-09-29 DIAGNOSIS — I739 Peripheral vascular disease, unspecified: Secondary | ICD-10-CM | POA: Diagnosis not present

## 2019-09-29 DIAGNOSIS — R400 Somnolence: Secondary | ICD-10-CM

## 2019-09-29 DIAGNOSIS — R0683 Snoring: Secondary | ICD-10-CM

## 2019-09-29 DIAGNOSIS — R5382 Chronic fatigue, unspecified: Secondary | ICD-10-CM

## 2019-09-29 DIAGNOSIS — I4891 Unspecified atrial fibrillation: Secondary | ICD-10-CM

## 2019-09-29 DIAGNOSIS — I480 Paroxysmal atrial fibrillation: Secondary | ICD-10-CM

## 2019-09-29 DIAGNOSIS — R5383 Other fatigue: Secondary | ICD-10-CM

## 2019-09-29 NOTE — Telephone Encounter (Signed)
Called and left HST appointment on patients voice mail.

## 2019-09-29 NOTE — Progress Notes (Signed)
Patient ID: Austin Davis, male   DOB: November 12, 1940, 79 y.o.   MRN: [id number]     Cardiology Office Note   Date:  09/29/2019   ID:  Austin Davis, DOB August 16, 1940, MRN [id number]  PCP:  [name], MD  Cardiologist:   Sanda Klein, MD   Chief Complaint  Patient presents with  . Atrial Fibrillation  . Fatigue      History of Present Illness: Austin Davis is a 79 y.o. male who presents for f/u of valvular heart disease and paroxysmal atrial fibrillation. He underwent mitral valve repair and Maze procedure by Dr. Prescott Gum in 2005 for mitral valve prolapse with severe mitral regurgitation and congestive heart failure.  Prior to surgery in 2005, he had a severely.the left atrium, 4+ mitral insufficiency and moderate pulmonary hypertension (PA  62/32). In 2015, echo showed left ventricular ejection fraction of 50-55%, mild mitral insufficiency and a mildly dilated left atrium. There was a description of "apical hypokinesis" but he had normal coronary arteries by preoperative cardiac catheterization, has not had angina and had normal ECG at that time of that report.  His biggest complaint is fatigue.  He feels "weak and tired all the time".  He wakes up feeling tired.  His sleep is very poor.  He has been a loud snorer his whole life.  He is not obese.  Recent lab work prompted by his complaints of fatigue show that he has normal thyroid function and a surprisingly high hemoglobin of 16.3.  He has never been diagnosed with obstructive sleep apnea.  He believes a lot of his problems are due to purposeful self isolation due to Covid risk.  He denies exertional dyspnea, orthopnea, PND.  He has minor leg edema sometimes at the end of the day but it resolves after overnight rest.  He denies claudication, focal neurological complaints (other than tremor of his hands), syncope, palpitations or angina at rest or with activity.  He has brownish hyperpigmentation in his lower extremities  and varicose veins.  As before, he has issues in the bilateral hand tremor that waxes and wanes in severity, sometimes preventing him from holding a glass of water without spilling it.  He seen neurology (Dr. Jaynee Eagles) for it and gabapentin was prescribed, but he is not taking it.  He has had well controlled anticoagulation on warfarin and denies any bleeding complications.  He has not had any falls or serious injuries, but his balance not as good as it was in the past.   Past Medical History:  Diagnosis Date  . CHF (congestive heart failure) ([place])   . Coronary artery disease   . Mitral regurgitation   . Mitral valve prolapse   . Paroxysmal atrial fibrillation [facility])     Past Surgical History:  Procedure Laterality Date  . COLONOSCOPY WITH PROPOFOL N/A 02/19/2017   Procedure: COLONOSCOPY WITH PROPOFOL;  Surgeon: Wilford Corner, MD;  Location: [facility];  Service: Endoscopy;  Laterality: N/A;  . MITRAL VALVE REPAIR  01/2003  . monitor  02/05/2006     Current Outpatient Medications  Medication Sig Dispense Refill  . acetaminophen (TYLENOL) 500 MG tablet Take 500 mg by mouth every 6 (six) hours as needed for mild pain or headache.    . gabapentin (NEURONTIN) 300 MG capsule Take 1 capsule (300 mg total) by mouth 3 (three) times daily as needed. 90 capsule 11  . Multiple Vitamin (MULTIVITAMIN) capsule Take 1 capsule by mouth daily.    Marland Kitchen  omega-3 acid ethyl esters (LOVAZA) 1 g capsule Take 1 g by mouth 2 (two) times daily.    . ranitidine (ZANTAC) 150 MG tablet Take 150 mg by mouth at bedtime.    Marland Kitchen warfarin (COUMADIN) 5 MG tablet Take 1/2 to 1 tablet daily as directed by coumadin clinic. 90 tablet 1   No current facility-administered medications for this visit.    Allergies:   Prednisone    Social History:  The patient  reports that he has quit smoking. He has never used smokeless tobacco. He reports current alcohol use. He reports that he does not use drugs.   Family History:  The  patient's family history includes Heart failure in his father, maternal grandmother, and paternal grandmother; Pneumonia in his maternal grandfather; Stroke in his paternal grandfather.    ROS:  Please see the history of present illness.   All other systems are reviewed and are negative.   PHYSICAL EXAM: VS:  BP 133/88   Pulse 89   Ht 5\' 5"  (1.651 m)   Wt 154 lb 12.8 oz (70.2 kg)   SpO2 97%   BMI 25.76 kg/m  , BMI Body mass index is 25.76 kg/m.  General: Alert, oriented x3, no distress Head: no evidence of trauma, PERRL, EOMI, no exophtalmos or lid lag, no myxedema, no xanthelasma; normal ears, nose and oropharynx Neck: normal jugular venous pulsations and no hepatojugular reflux; brisk carotid pulses without delay and no carotid bruits Chest: clear to auscultation, no signs of consolidation by percussion or palpation, normal fremitus, symmetrical and full respiratory excursions Cardiovascular: normal position and quality of the apical impulse, regular rhythm, normal first and second heart sounds, no murmurs, rubs or gallops Abdomen: no tenderness or distention, no masses by palpation, no abnormal pulsatility or arterial bruits, normal bowel sounds, no hepatosplenomegaly Extremities: Hyperpigmentation of the lower half of his shins bilaterally, varicose veins, no clubbing, cyanosis or edema; 2+ radial, ulnar and brachial pulses bilaterally; 2+ right femoral, posterior tibial and dorsalis pedis pulses; 2+ left femoral, posterior tibial and dorsalis pedis pulses; no subclavian or femoral bruits Neurological: Bilateral resting hand tremor, otherwise grossly nonfocal Psych: Normal mood and affect  EKG:  EKG is ordered today.  It shows normal sinus rhythm, normal tracing  Recent Labs: No results found for requested labs within last 8760 hours.    Lipid Panel    Component Value Date/Time   CHOL 180 01/30/2015 1115   TRIG 75 01/30/2015 1115   HDL 55 01/30/2015 1115   CHOLHDL 3.3  01/30/2015 1115   VLDL 15 01/30/2015 1115   LDLCALC 110 01/30/2015 1115      Wt Readings from Last 3 Encounters:  09/29/19 154 lb 12.8 oz (70.2 kg)  02/19/17 170 lb (77.1 kg)  10/10/16 171 lb (77.6 kg)     ASSESSMENT AND PLAN:  1. Afib: Normal sinus rhythm today.  No clinical recurrence of atrial fibrillation since his surgical Maze procedure. 2. S/P MV repair: History of mitral valve repair for severe regurgitation due to mitral valve prolapse in 2005.  He does not have overt evidence of heart failure and his physical exam does not show any reply effective no movement complications.  He has prominent symptoms of fatigue.  He will repeat his echocardiogram. 3. Essential tremor: Doing well with gabapentin and was reluctant to try treatment with propranolol.  Considering briefly complained of fatigue at this time, I do not think it is a good option to start beta-blockers Until we clarify the  cause of his complaints. 4.  Possible obstructive sleep apnea: Many of his complaints suggest this possibility, although from a phenotype on his review really does not fit the bill. Sleeps by himself, no one to witness if he has apnea ; previously told he is a loud snorer.  I recommended a minimum overnight oximetry study at home, if this is abnormal he will need a formal sleep test. 5. PAD: Incompressible calcified vessels in the lower extremities, unable to calculate ABI, but the bilateral toe brachial indices are normal.   Current medicines are reviewed at length with the patient today.  The patient does not have concerns regarding medicines.  The following changes have been made:  no change  Labs/ tests ordered today include:   Orders Placed This Encounter  Procedures  . EKG 12-Lead  . ECHOCARDIOGRAM COMPLETE  . Home sleep test     Patient Instructions  Medication Instructions:  No changes *If you need a refill on your cardiac medications before your next appointment, please call your  pharmacy*   Lab Work: None ordered If you have labs (blood work) drawn today and your tests are completely normal, you will receive your results only by: Marland Kitchen MyChart Message (if you have MyChart) OR . A paper copy in the mail If you have any lab test that is abnormal or we need to change your treatment, we will call you to review the results.   Testing/Procedures: Your physician has requested that you have an echocardiogram. Echocardiography is a painless test that uses sound waves to create images of your heart. It provides your doctor with information about the size and shape of your heart and how well your heart's chambers and valves are working. You may receive an ultrasound enhancing agent through an IV if needed to better visualize your heart during the echo.This procedure takes approximately one hour. There are no restrictions for this procedure. This will take place at the [address].   Your physician has recommended that you have a home sleep study. This test records several body functions during sleep, including: brain activity, eye movement, oxygen and carbon dioxide blood levels, heart rate and rhythm, breathing rate and rhythm, the flow of air through your mouth and nose, snoring, body muscle movements, and chest and belly movement.   Follow-Up: At [facility], you and your health needs are our priority.  As part of our continuing mission to provide you with exceptional heart care, we have created designated Provider Care Teams.  These Care Teams include your primary Cardiologist (physician) and Advanced Practice Providers (APPs -  Physician Assistants and Nurse Practitioners) who all work together to provide you with the care you need, when you need it.  We recommend signing up for the patient portal called "MyChart".  Sign up information is provided on this After Visit Summary.  MyChart is used to connect with patients for Virtual Visits (Telemedicine).  Patients  are able to view lab/test results, encounter notes, upcoming appointments, etc.  Non-urgent messages can be sent to your provider as well.   To learn more about what you can do with MyChart, go to [email or web address].    Your next appointment:   6 month(s)  The format for your next appointment:   In Person  Provider:   You may see Sanda Klein, MD or one of the following Advanced Practice Providers on your designated Care Team:    Almyra Deforest, PA-C  Fabian Sharp, [place] or  Roby Lofts, PA-C    Signed, Sanda Klein, MD  09/29/2019 10:00 AM    Sanda Klein, MD, [facility] [phone] office [phone] pager

## 2019-09-29 NOTE — Patient Instructions (Signed)
Medication Instructions:  No changes *If you need a refill on your cardiac medications before your next appointment, please call your pharmacy*   Lab Work: None ordered If you have labs (blood work) drawn today and your tests are completely normal, you will receive your results only by: Marland Kitchen MyChart Message (if you have MyChart) OR . A paper copy in the mail If you have any lab test that is abnormal or we need to change your treatment, we will call you to review the results.   Testing/Procedures: Your physician has requested that you have an echocardiogram. Echocardiography is a painless test that uses sound waves to create images of your heart. It provides your doctor with information about the size and shape of your heart and how well your heart's chambers and valves are working. You may receive an ultrasound enhancing agent through an IV if needed to better visualize your heart during the echo.This procedure takes approximately one hour. There are no restrictions for this procedure. This will take place at the [address].   Your physician has recommended that you have a home sleep study. This test records several body functions during sleep, including: brain activity, eye movement, oxygen and carbon dioxide blood levels, heart rate and rhythm, breathing rate and rhythm, the flow of air through your mouth and nose, snoring, body muscle movements, and chest and belly movement.   Follow-Up: At [facility], you and your health needs are our priority.  As part of our continuing mission to provide you with exceptional heart care, we have created designated Provider Care Teams.  These Care Teams include your primary Cardiologist (physician) and Advanced Practice Providers (APPs -  Physician Assistants and Nurse Practitioners) who all work together to provide you with the care you need, when you need it.  We recommend signing up for the patient portal called "MyChart".  Sign up  information is provided on this After Visit Summary.  MyChart is used to connect with patients for Virtual Visits (Telemedicine).  Patients are able to view lab/test results, encounter notes, upcoming appointments, etc.  Non-urgent messages can be sent to your provider as well.   To learn more about what you can do with MyChart, go to [email or web address].    Your next appointment:   6 month(s)  The format for your next appointment:   In Person  Provider:   You may see Sanda Klein, MD or one of the following Advanced Practice Providers on your designated Care Team:    Almyra Deforest, PA-C  Fabian Sharp, PA-C or   Roby Lofts, [place]

## 2019-10-10 ENCOUNTER — Ambulatory Visit (INDEPENDENT_AMBULATORY_CARE_PROVIDER_SITE_OTHER): Payer: Medicare Other

## 2019-10-10 ENCOUNTER — Other Ambulatory Visit: Payer: Self-pay

## 2019-10-10 DIAGNOSIS — Z7901 Long term (current) use of anticoagulants: Secondary | ICD-10-CM

## 2019-10-10 LAB — POCT INR: INR: 1.9 — AB (ref 2.0–3.0)

## 2019-10-10 NOTE — Patient Instructions (Signed)
Take 2 tablets today and then continue 1 tablet daily except 1/2 tablet each Sunday  Repeat INR in 4 weeks.

## 2019-10-11 DIAGNOSIS — H10413 Chronic giant papillary conjunctivitis, bilateral: Secondary | ICD-10-CM | POA: Diagnosis not present

## 2019-10-11 DIAGNOSIS — H25813 Combined forms of age-related cataract, bilateral: Secondary | ICD-10-CM | POA: Diagnosis not present

## 2019-10-11 DIAGNOSIS — H04123 Dry eye syndrome of bilateral lacrimal glands: Secondary | ICD-10-CM | POA: Diagnosis not present

## 2019-10-11 DIAGNOSIS — H33303 Unspecified retinal break, bilateral: Secondary | ICD-10-CM | POA: Diagnosis not present

## 2019-10-11 DIAGNOSIS — H31093 Other chorioretinal scars, bilateral: Secondary | ICD-10-CM | POA: Diagnosis not present

## 2019-10-14 ENCOUNTER — Other Ambulatory Visit: Payer: Self-pay

## 2019-10-14 ENCOUNTER — Ambulatory Visit (HOSPITAL_COMMUNITY): Payer: Medicare Other | Attending: Cardiology

## 2019-10-14 DIAGNOSIS — I059 Rheumatic mitral valve disease, unspecified: Secondary | ICD-10-CM | POA: Insufficient documentation

## 2019-10-14 LAB — ECHOCARDIOGRAM COMPLETE
Area-P 1/2: 2.76 cm2
P 1/2 time: 330 msec
S' Lateral: 3.5 cm

## 2019-10-21 ENCOUNTER — Encounter: Payer: Self-pay | Admitting: *Deleted

## 2019-11-07 ENCOUNTER — Ambulatory Visit (INDEPENDENT_AMBULATORY_CARE_PROVIDER_SITE_OTHER): Payer: Medicare Other

## 2019-11-07 DIAGNOSIS — Z7901 Long term (current) use of anticoagulants: Secondary | ICD-10-CM | POA: Diagnosis not present

## 2019-11-07 LAB — POCT INR: INR: 2.3 (ref 2.0–3.0)

## 2019-11-07 NOTE — Patient Instructions (Signed)
continue 1 tablet daily except 1/2 tablet each Sunday  Repeat INR in 6 weeks.

## 2019-11-09 DIAGNOSIS — Z23 Encounter for immunization: Secondary | ICD-10-CM | POA: Diagnosis not present

## 2019-11-11 ENCOUNTER — Other Ambulatory Visit: Payer: Self-pay

## 2019-11-11 ENCOUNTER — Ambulatory Visit (HOSPITAL_BASED_OUTPATIENT_CLINIC_OR_DEPARTMENT_OTHER): Payer: Medicare Other | Attending: Cardiovascular Disease | Admitting: Cardiovascular Disease

## 2019-11-11 DIAGNOSIS — R0683 Snoring: Secondary | ICD-10-CM | POA: Insufficient documentation

## 2019-11-11 DIAGNOSIS — G4733 Obstructive sleep apnea (adult) (pediatric): Secondary | ICD-10-CM | POA: Insufficient documentation

## 2019-11-11 DIAGNOSIS — Z7901 Long term (current) use of anticoagulants: Secondary | ICD-10-CM | POA: Diagnosis not present

## 2019-11-11 DIAGNOSIS — R0902 Hypoxemia: Secondary | ICD-10-CM | POA: Insufficient documentation

## 2019-11-11 DIAGNOSIS — I4891 Unspecified atrial fibrillation: Secondary | ICD-10-CM | POA: Diagnosis not present

## 2019-11-11 DIAGNOSIS — R5383 Other fatigue: Secondary | ICD-10-CM | POA: Diagnosis not present

## 2019-11-14 DIAGNOSIS — Z23 Encounter for immunization: Secondary | ICD-10-CM | POA: Diagnosis not present

## 2019-11-15 NOTE — Progress Notes (Signed)
[facility]    Provider:  Dr Jaynee Eagles Requesting Provider: [name], MD Primary Care Provider:  [name], MD  CC:  Tremors  HPI:  Austin Davis is a 79 y.o. male here as requested by [name], MD for Tremor. PMHx A. fib on Coumadin following valvular repair, tremors, memory difficulties, daytime somnolence, secondary hypercoagulable state, muscle cramping, migraines, malaise and fatigue.  Patient was seen in 2017 for tremors and following is a review of those records: he started noticing tremors after his heart surgery in 2005, more in the face and hands, also in the legs occasionally, affecting his handwriting, difficulty eating, feeling in his face as well, stress makes it worse in fact very affected by stress and anxiety, stressful environment, he has a family history of tremor in his father, no caffeine or energy drinks, felt it was slowly progressive without any other focal deficits, his thyroid was tested.  Examination showed a high-frequency low amplitude postural tremor in the head and upper extremities with a kinetic component, no resting tremor.  At the time when he was seen in 2017 we discussed an MRI of the brain which he declined, we tried gabapentin as needed and we also discussed possibly trying propranolol, he did not follow-up after that so I am not unsure how these medications worked or if he even tried them.    I also reviewed Dr. Alan Ripper notes: Dr. Harrington Challenger stated the tremor is worsening and now affecting his voice, labs include normal TSH 2.24, normal vitamin D, CMP showed BUN 19 and creatinine 1.12 otherwise unremarkable, unremarkable CBC, B12 greater than 1500 and sed rate normal 7, labs were collected August 15, 2019.    Patient is here alone and states his tremor is worse. He did not try Neurontin because he read it was addictive. He feels very sleepy. He just had a sleep evaluation. He is worried the tremor is something else. He  feels rotten, tired a lot, no energy, no weakness per se, he feels sleepy in the daytime and problems sleeping, he naps a lot. He notices the tremor in his hands and also in his voice and head. He has some cramps in his limbs and hands. He has been trying magnesium which has not helped. He has not had any falls. He spent the winter "hunkered down" and he thinks maybe he has a lot of disuse. He notices the tremor when doing things, not really at rest. Writing is impossible. He feels tremors in his legs too "sometimes". The tremor is worse with stress. It is continuous.   Reviewed notes, labs and imaging from outside physicians, which showed: see above  Review of Systems: Patient complains of symptoms per HPI as well as the following symptoms tremors, fatigue, generalized weakness, daytime sleepiness Pertinent negatives and positives per HPI. All others negative.   Social History   Socioeconomic History  . Marital status: Single    Spouse name: Not on file  . Number of children: 2  . Years of education: 3  . Highest education level: Not on file  Occupational History  . Occupation: [employer]  Tobacco Use  . Smoking status: Never Smoker  . Smokeless tobacco: Never Used  . Tobacco comment: only tried a few cigarettes when he was younger  Substance and Sexual Activity  . Alcohol use: Yes    Comment: "probably a 6 pack of beer a year"  . Drug use: No  . Sexual activity: Not  on file  Other Topics Concern  . Not on file  Social History Narrative   Lives with girlfriend   Caffeine use: rarely    Right handed   Social Determinants of Health   Financial Resource Strain:   . Difficulty of Paying Living Expenses: Not on file  Food Insecurity:   . Worried About Charity fundraiser in the Last Year: Not on file  . Ran Out of Food in the Last Year: Not on file  Transportation Needs:   . Lack of Transportation (Medical): Not on file  . Lack of Transportation (Non-Medical): Not  on file  Physical Activity:   . Days of Exercise per Week: Not on file  . Minutes of Exercise per Session: Not on file  Stress:   . Feeling of Stress : Not on file  Social Connections:   . Frequency of Communication with Friends and Family: Not on file  . Frequency of Social Gatherings with Friends and Family: Not on file  . Attends Religious Services: Not on file  . Active Member of Clubs or Organizations: Not on file  . Attends Archivist Meetings: Not on file  . Marital Status: Not on file  Intimate Partner Violence:   . Fear of Current or Ex-Partner: Not on file  . Emotionally Abused: Not on file  . Physically Abused: Not on file  . Sexually Abused: Not on file    Family History  Problem Relation Age of Onset  . Heart disease Mother   . CAD Mother   . Migraines Mother   . Heart failure Father   . Skin cancer Father   . Valvular heart disease Father        prolapsed valve  . Tremor Father        "probably had a bit of tremor in his old age"  . Heart failure Maternal Grandmother   . Pneumonia Maternal Grandfather   . Heart failure Paternal Grandmother   . Stroke Paternal Grandfather   . Asthma Daughter   . Epilepsy Son   . Parkinson's disease Neg Hx     Past Medical History:  Diagnosis Date  . CHF (congestive heart failure) ([place])   . Coronary artery disease   . GI bleed 2019   hospitalized at [facility] for one week  . Headache    since childhood  . Low blood sugar    since childhood, controlled by diet  . Mitral regurgitation   . Mitral valve prolapse   . Osteopenia 2021  . Paroxysmal atrial fibrillation [facility])     Patient Active Problem List   Diagnosis Date Noted  . GIB (gastrointestinal bleeding) 02/17/2017  . Rectal bleeding 02/16/2017  . 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  . Essential tremor 02/19/2015  . Status post mitral valve annuloplasty and MAZE 2005 12/12/2014  . HLD (hyperlipidemia) 05/05/2013  . DOE (dyspnea  on exertion) 04/22/2013  . Fatigue 04/22/2013  . Atrial fibrillation ([place]) 05/08/2012  . Long term (current) use of anticoagulants 05/08/2012    Past Surgical History:  Procedure Laterality Date  . COLONOSCOPY WITH PROPOFOL N/A 02/19/2017   Procedure: COLONOSCOPY WITH PROPOFOL;  Surgeon: Wilford Corner, MD;  Location: [facility] ENDOSCOPY;  Service: Endoscopy;  Laterality: N/A;  . laser eye surgery for retina detachment    . MITRAL VALVE REPAIR  01/2003  . monitor  02/05/2006  . polyp removal    . TONSILLECTOMY    . tooth removal  as a teenager    Current Outpatient Medications  Medication Sig Dispense Refill  . acetaminophen (TYLENOL) 500 MG tablet Take 500 mg by mouth every 6 (six) hours as needed for mild pain or headache.    . Ascorbic Acid (VITAMIN C IMMUNE HEALTH PO) Take 500 mg by mouth.    . B Complex Vitamins (VITAMIN B COMPLEX PO) Take by mouth.    . Calcium Citrate-Vitamin D (CALCIUM + D PO) Take 400 mg by mouth daily.    . Cholecalciferol (D3) 50 MCG (2000 UT) TABS Take by mouth daily.    . Garlic 263 MG TABS Take by mouth.    . Magnesium 250 MG TABS Take by mouth.    . melatonin 3 MG TABS tablet Take 3 mg by mouth at bedtime as needed.    . Multiple Vitamin (MULTIVITAMIN) capsule Take 1 capsule by mouth. sometimes    . omega-3 acid ethyl esters (LOVAZA) 1 g capsule Take 1 g by mouth 2 (two) times daily.    . Omega-3 Fatty Acids (FISH OIL PO) Take 750 mg by mouth.    . warfarin (COUMADIN) 5 MG tablet Take 1/2 to 1 tablet daily as directed by coumadin clinic. 90 tablet 1   No current facility-administered medications for this visit.    Allergies as of 11/16/2019 - Review Complete 11/16/2019  Allergen Reaction Noted  . Prednisone Other (See Comments) 05/03/2013  . Cortisone Palpitations 11/16/2019    Vitals: BP (!) 151/92 (BP Location: Left Arm, Patient Position: Sitting)   Pulse 97   Ht 5\' 7"  (1.702 m)   Wt 151 lb (68.5 kg)   BMI 23.65 kg/m  Last Weight:  Wt  Readings from Last 1 Encounters:  11/16/19 151 lb (68.5 kg)   Last Height:   Ht Readings from Last 1 Encounters:  11/16/19 5\' 7"  (1.702 m)     Physical exam: Exam: Gen: NAD, conversant                     CV: RRR, no MRG. No Carotid Bruits. No peripheral edema, warm, nontender Eyes: Conjunctivae clear without exudates or hemorrhage  Neuro: Detailed Neurologic Exam  Speech:    Speech is normal; fluent and spontaneous with normal comprehension.  Cognition:    The patient is oriented to person, place, and time;     recent and remote memory intact;     language fluent;     normal attention, concentration,     fund of knowledge Cranial Nerves:    The pupils are equal, round, and reactive to light. Pupils too small to visualize fundi.  The fundi are normal and spontaneous venous pulsations are present. Visual fields are full to finger confrontation. Extraocular movements are intact. Trigeminal sensation is intact and the muscles of mastication are normal. The face is symmetric. The palate elevates in the midline. Hearing intact. Voice is normal. Shoulder shrug is normal. The tongue has normal motion without fasciculations.   Coordination:    No dysmetria   Gait:    Heel-toe gait are normal.  Good stride and arm swing. Slight upper back rounding and neck flexion when walking.   Motor Observation:    Tremor head, voice, hands, chin action and postural >> resting. Tone:    Normal muscle tone.  No cogwheeling.       Strength:    Strength is V/V in the upper and lower limbs.      Sensation: intact to LT  Reflex Exam:  DTR's: absent AJ, 1+ biceps and patellars    Deep tendon reflexes in the upper and lower extremities are symmetrical bilaterally.   Toes:    The toes are equiv bilaterally.   Clonus:    Clonus is absent.    Assessment/Plan:  Really lovely gentleman with tremors. He has head, chin, voice and hand postural >> resting tremors. Progressive.  I don't see any  other parkinsonian features, his stride and arm swing are good, tone is normal. I still think this is essential tremor. He is so fatigued I discouraged him from trying any medication for essential tremor at least until the sleep test comes back and any sleep disorder is treated. If sleep study is negative I will order MRI of the brain and also bloodwork to rule out neurologic causes of his general fatigue and weakness and an emg/ncs but if the sleep test is positive I would wait and come back in a few months when feeling better on on a cpap and we can discuss medications for essential tremor if needed. We had a long discussion about this, he is in agreement. He is worried due to his fatigue and sleepiness, sounds like he does have sleep apnea but will wait to review results before any further testing or treatment.    Cc: [name], MD  Sarina Ill, MD  [facility] [address] [place],  [address]  Phone [phone] Fax [phone]  I spent over 60 minutes of face-to-face and non-face-to-face time with patient on the  1. Essential tremor    diagnosis.  This included previsit chart review, lab review, study review, order entry, electronic health record documentation, patient education on the different diagnostic and therapeutic options, counseling and coordination of care, risks and benefits of management, compliance, or risk factor reduction

## 2019-11-16 ENCOUNTER — Encounter: Payer: Self-pay | Admitting: *Deleted

## 2019-11-16 ENCOUNTER — Other Ambulatory Visit: Payer: Self-pay

## 2019-11-16 ENCOUNTER — Ambulatory Visit (INDEPENDENT_AMBULATORY_CARE_PROVIDER_SITE_OTHER): Payer: Medicare Other | Admitting: Neurology

## 2019-11-16 ENCOUNTER — Encounter: Payer: Self-pay | Admitting: Neurology

## 2019-11-16 VITALS — BP 151/92 | HR 97 | Ht 67.0 in | Wt 151.0 lb

## 2019-11-16 DIAGNOSIS — G250 Essential tremor: Secondary | ICD-10-CM | POA: Diagnosis not present

## 2019-11-16 NOTE — Patient Instructions (Signed)

## 2019-11-16 NOTE — Progress Notes (Signed)
Source: referral notes from Dr Lona Kettle.

## 2019-11-17 ENCOUNTER — Encounter: Payer: Self-pay | Admitting: Neurology

## 2019-11-22 ENCOUNTER — Encounter (HOSPITAL_BASED_OUTPATIENT_CLINIC_OR_DEPARTMENT_OTHER): Payer: Self-pay | Admitting: Cardiovascular Disease

## 2019-11-22 ENCOUNTER — Telehealth: Payer: Self-pay

## 2019-11-22 DIAGNOSIS — R5383 Other fatigue: Secondary | ICD-10-CM | POA: Diagnosis not present

## 2019-11-22 DIAGNOSIS — G4733 Obstructive sleep apnea (adult) (pediatric): Secondary | ICD-10-CM | POA: Diagnosis not present

## 2019-11-22 DIAGNOSIS — R0683 Snoring: Secondary | ICD-10-CM

## 2019-11-22 NOTE — Telephone Encounter (Signed)
-----   Message from Troy Sine, MD sent at 11/22/2019 12:56 PM EDT ----- Gae Bon, please notify pt and try to set up for in-lab CPAP titration study

## 2019-11-22 NOTE — Addendum Note (Signed)
Addended by: Campbell Riches on: 11/22/2019 01:08 PM   Modules accepted: Orders

## 2019-11-22 NOTE — Telephone Encounter (Signed)
Per DPR left detailed message on home voicemail of sleep study results and recommendation of CPAP titration Informed patient on voicemail that message will be sent to pre cert for CPAP titration and that it will have to be an in lab study at [facility] Informed patient to call Gershon Cull, CMA with any questions or concerns Informed patient that once CPAP titration is pre-certed then he will be contacted to be scheduled.

## 2019-11-22 NOTE — Procedures (Signed)
      Patient Name: Austin Davis, Austin Davis Date: 11/12/2019 Gender: Male D.O.B: 12-20-1940 Age (years): 78 Referring Provider: Dani Gobble Croitoru Height (inches): 27 Interpreting Physician: Shelva Majestic MD, ABSM Weight (lbs): 155 RPSGT: Jacolyn Reedy BMI: 24 MRN: [id number] Neck Size: 15.25  CLINICAL INFORMATION Sleep Study Type: HST  Indication for sleep study: snoring, fatigue, PAF  Epworth Sleepiness Score: 7  SLEEP STUDY TECHNIQUE A multi-channel overnight portable sleep study was performed. The channels recorded were: nasal airflow, thoracic respiratory movement, and oxygen saturation with a pulse oximetry. Snoring was also monitored.  MEDICATIONS acetaminophen (TYLENOL) 500 MG tablet Ascorbic Acid (VITAMIN C IMMUNE HEALTH PO) B Complex Vitamins (VITAMIN B COMPLEX PO) Calcium Citrate-Vitamin D (CALCIUM + D PO) Cholecalciferol (D3) 50 MCG (9024 UT) TABS Garlic 097 MG TABS Magnesium 250 MG TABS melatonin 3 MG TABS tablet Multiple Vitamin (MULTIVITAMIN) capsule omega-3 acid ethyl esters (LOVAZA) 1 g capsule Omega-3 Fatty Acids (FISH OIL PO) warfarin (COUMADIN) 5 MG tablet  Patient self administered medications include: N/A.  SLEEP ARCHITECTURE Patient was studied for 511.8 minutes. The sleep efficiency was 100.0 % and the patient was supine for 1.3%. The arousal index was 0.0 per hour.  RESPIRATORY PARAMETERS The overall AHI was 31.7 per hour, with a central apnea index of 0.0 per hour.  The oxygen nadir was 81% during sleep. Time spent <89% was 30.5 minutes  CARDIAC DATA Mean heart rate during sleep was 72.9 bpm.  IMPRESSIONS - Severe obstructive sleep apnea occurred during this study (AHI 31.7/h). The severity during REM sleep cannot be assessed on this home study. - No significant central sleep apnea occurred during this study (CAI = 0.0/h). - Moderate oxygen desaturation to a nadir of 81%. - No snoring was audible during this study.  DIAGNOSIS -  Obstructive Sleep Apnea (G47.33) - Nocturnal Hypoxemia (G47.36)  RECOMMENDATIONS - In this patient with significant cardiovascular comorbidities and severe sleep apnea recommend an in-lab CPAP titration study. - Effort should be made to optimize nasal and oropharyngeal patency. - Avoid alcohol, sedatives and other CNS depressants that may worsen sleep apnea and disrupt normal sleep architecture. - Sleep hygiene should be reviewed to assess factors that may improve sleep quality. - Weight management and regular exercise should be initiated or continued. - Recommend a download and sleep clinic evaluation after CPAP initiation.   [Electronically signed] 11/22/2019 12:51 PM  Shelva Majestic MD,FACC, ABSM Diplomate, American Board of Sleep Medicine   NPI: [id number]  [place] PH: [phone]   FX: [phone] [place]

## 2019-11-28 NOTE — Progress Notes (Signed)
TY

## 2019-12-19 ENCOUNTER — Ambulatory Visit (INDEPENDENT_AMBULATORY_CARE_PROVIDER_SITE_OTHER): Payer: Medicare Other

## 2019-12-19 ENCOUNTER — Other Ambulatory Visit: Payer: Self-pay

## 2019-12-19 DIAGNOSIS — Z7901 Long term (current) use of anticoagulants: Secondary | ICD-10-CM

## 2019-12-19 LAB — POCT INR: INR: 2.4 (ref 2.0–3.0)

## 2019-12-19 NOTE — Patient Instructions (Signed)
continue 1 tablet daily except 1/2 tablet each Sunday  Repeat INR in 6 weeks.

## 2019-12-26 DIAGNOSIS — Z1389 Encounter for screening for other disorder: Secondary | ICD-10-CM | POA: Diagnosis not present

## 2019-12-26 DIAGNOSIS — R531 Weakness: Secondary | ICD-10-CM | POA: Diagnosis not present

## 2019-12-26 DIAGNOSIS — Z0000 Encounter for general adult medical examination without abnormal findings: Secondary | ICD-10-CM | POA: Diagnosis not present

## 2019-12-27 ENCOUNTER — Telehealth: Payer: Self-pay | Admitting: *Deleted

## 2019-12-27 NOTE — Telephone Encounter (Signed)
Patient is scheduled for lab study on 02/12/19.

## 2019-12-27 NOTE — Telephone Encounter (Signed)
-----   Message from [facility], [place] sent at 11/22/2019  1:05 PM EDT ----- Regarding: CPAP Titration Hey  Pt needs a CPAP titration Orders in  Thanks,  [place]

## 2019-12-27 NOTE — Telephone Encounter (Signed)
Patient is scheduled for lab study on 02/12/19. Patient understands her sleep study will be done at [facility] sleep lab. Patient understands she will receive a sleep packet in a week or so. Patient understands to call if she does not receive the sleep packet in a timely manner. Patient agrees with treatment and thanked me for call.

## 2019-12-28 DIAGNOSIS — Z0000 Encounter for general adult medical examination without abnormal findings: Secondary | ICD-10-CM | POA: Diagnosis not present

## 2019-12-28 DIAGNOSIS — R531 Weakness: Secondary | ICD-10-CM | POA: Diagnosis not present

## 2020-01-30 ENCOUNTER — Ambulatory Visit (INDEPENDENT_AMBULATORY_CARE_PROVIDER_SITE_OTHER): Payer: Medicare Other

## 2020-01-30 ENCOUNTER — Other Ambulatory Visit: Payer: Self-pay

## 2020-01-30 DIAGNOSIS — Z7901 Long term (current) use of anticoagulants: Secondary | ICD-10-CM | POA: Diagnosis not present

## 2020-01-30 LAB — POCT INR: INR: 1.7 — AB (ref 2.0–3.0)

## 2020-01-30 NOTE — Patient Instructions (Signed)
Take 2 tablets tonight and then continue 1 tablet daily except 1/2 tablet each Sunday  Repeat INR in 4 weeks.

## 2020-02-12 ENCOUNTER — Other Ambulatory Visit: Payer: Self-pay

## 2020-02-12 ENCOUNTER — Ambulatory Visit (HOSPITAL_BASED_OUTPATIENT_CLINIC_OR_DEPARTMENT_OTHER): Payer: Medicare Other | Attending: Cardiovascular Disease | Admitting: Cardiovascular Disease

## 2020-02-12 DIAGNOSIS — R0683 Snoring: Secondary | ICD-10-CM | POA: Diagnosis present

## 2020-02-12 DIAGNOSIS — G4733 Obstructive sleep apnea (adult) (pediatric): Secondary | ICD-10-CM | POA: Insufficient documentation

## 2020-02-12 DIAGNOSIS — G4761 Periodic limb movement disorder: Secondary | ICD-10-CM | POA: Insufficient documentation

## 2020-02-20 ENCOUNTER — Encounter (HOSPITAL_BASED_OUTPATIENT_CLINIC_OR_DEPARTMENT_OTHER): Payer: Self-pay | Admitting: Cardiovascular Disease

## 2020-02-20 NOTE — Procedures (Signed)
Patient Name: Austin Davis, Austin Davis Date: 02/12/2020 Gender: Male D.O.B: [birth year] Age (years): 31 Referring Provider: Dani Gobble Croitoru Height (inches): 67 Interpreting Physician: Shelva Majestic MD, ABSM Weight (lbs): 146 RPSGT: Gwenyth Allegra BMI: 23 MRN: [id number] Neck Size: 15.25  CLINICAL INFORMATION The patient is referred for a CPAP titration to treat sleep apnea.  Date of HST: 11/11/2019: AHI 31.7/h; O 2 nadir 81%. Time spent <89% was 30.5 minutes.  SLEEP STUDY TECHNIQUE As per the AASM Manual for the Scoring of Sleep and Associated Events v2.3 (April 2016) with a hypopnea requiring 4% desaturations.  The channels recorded and monitored were frontal, central and occipital EEG, electrooculogram (EOG), submentalis EMG (chin), nasal and oral airflow, thoracic and abdominal wall motion, anterior tibialis EMG, snore microphone, electrocardiogram, and pulse oximetry. Continuous positive airway pressure (CPAP) was initiated at the beginning of the study and titrated to treat sleep-disordered breathing.  MEDICATIONS Medications self-administered by patient taken the night of the study : TYLENOL PM  TECHNICIAN COMMENTS Comments added by technician: Patient was restless all through the night. Comments added by scorer: N/A  RESPIRATORY PARAMETERS Optimal PAP Pressure (cm): 11 AHI at Optimal Pressure (/hr): 0.0 Overall Minimal O2 (%): 87.0 Supine % at Optimal Pressure (%): 2 Minimal O2 at Optimal Pressure (%): 88.0   SLEEP ARCHITECTURE The study was initiated at 10:09:54 PM and ended at 4:32:42 AM.  Sleep onset time was 73.8 minutes and the sleep efficiency was 23.0%%. The total sleep time was 88 minutes.  The patient spent 14.8%% of the night in stage N1 sleep, 72.7%% in stage N2 sleep, 0.0%% in stage N3 and 12.5% in REM.Stage REM latency was 63.5 minutes  Wake after sleep onset was 221.0. Alpha intrusion was absent. Supine sleep was 0.57%.  CARDIAC DATA The 2  lead EKG demonstrated sinus rhythm. The mean heart rate was 85.5 beats per minute. Other EKG findings include: None.  LEG MOVEMENT DATA The total Periodic Limb Movements of Sleep (PLMS) were 0. The PLMS index was 0.0. A PLMS index of <15 is considered normal in adults.  IMPRESSIONS - CPAP was initiated at 7 cm and was titrated to 11 cm of water;  AHI 0, RDI 5.6/h; O2 nadir 88%. - Central sleep apnea was not noted during this titration (CAI = 0.0/h). - Mild oxygen desaturations were observed during this titration (min O2 = 87.0%). - No snoring was audible during this study. - No cardiac abnormalities were observed during this study. - Clinically significant periodic limb movements were not noted during this study. Arousals associated with PLMs were significant.  DIAGNOSIS - Obstructive Sleep Apnea (G47.33) - Periodic Limb Movement During Sleep (G47.61)  RECOMMENDATIONS - Recommend an initial trial of CPAP Auto therapy at 10 - 16 cm H2O with heated humidification.  A Medium size Fisher&Paykel Full Face Mask Simplus mask was used for the titration. - Effort should be made to optimize nasal and oropharyngeal patency - Avoid alcohol, sedatives and other CNS depressants that may worsen sleep apnea and disrupt normal sleep architecture. - Sleep hygiene should be reviewed to assess factors that may improve sleep quality. - Weight management and regular exercise should be initiated or continued. - Recommend a download in 30 days and sleep clinic evaluation after 4 weeks of therapy.   [Electronically signed] 02/20/2020 11:46 AM  Shelva Majestic MD, [facility], [place], American Board of Sleep Medicine   NPI: [id number] [place]: [phone]   FX: [phone] ACCREDITED  BY THE AMERICAN ACADEMY OF SLEEP MEDICINE

## 2020-02-22 ENCOUNTER — Telehealth: Payer: Self-pay | Admitting: *Deleted

## 2020-02-22 NOTE — Telephone Encounter (Signed)
CPAP order faxed to [facility].

## 2020-02-27 ENCOUNTER — Other Ambulatory Visit: Payer: Self-pay

## 2020-02-27 ENCOUNTER — Ambulatory Visit (INDEPENDENT_AMBULATORY_CARE_PROVIDER_SITE_OTHER): Payer: Medicare Other

## 2020-02-27 DIAGNOSIS — Z7901 Long term (current) use of anticoagulants: Secondary | ICD-10-CM | POA: Diagnosis not present

## 2020-02-27 LAB — POCT INR: INR: 1.9 — AB (ref 2.0–3.0)

## 2020-02-27 NOTE — Telephone Encounter (Signed)
Patient returned a call to me and was informed that CPAP has been ordered. Sleep study results were also discussed with the patient. He states that no one really ever told him exactly what the results meant. All of the patient's questions were answered to his complete satisfaction. He thanked me for calling him.

## 2020-02-27 NOTE — Patient Instructions (Signed)
Take 2 tablets tonight and then continue 1 tablet daily.  Repeat INR in 4 weeks.

## 2020-03-14 ENCOUNTER — Other Ambulatory Visit: Payer: Self-pay | Admitting: Cardiology

## 2020-03-21 ENCOUNTER — Ambulatory Visit: Payer: Medicare Other | Admitting: Neurology

## 2020-03-21 ENCOUNTER — Telehealth: Payer: Self-pay | Admitting: Neurology

## 2020-03-21 DIAGNOSIS — R059 Cough, unspecified: Secondary | ICD-10-CM | POA: Diagnosis not present

## 2020-03-21 NOTE — Telephone Encounter (Signed)
LVM informing pt of new appointment day and time.

## 2020-03-21 NOTE — Telephone Encounter (Signed)
Jillian, I spoke to patient, he has OSA and has yet to get his cpap. We discussed he would come back after he was on the cpap to see if his fatigue was improved. Please cancel today and reschedule him for end of day(last appointment of the day) in 3 months and call patient with appointment. Also block spot thanks,  Thanks Civil engineer, contracting)

## 2020-03-21 NOTE — Progress Notes (Deleted)
[id number]    Provider:  Dr Jaynee Eagles Requesting Provider: [name], MD Primary Care Provider:  [name], MD  CC:  Tremors  HPI:  Austin Davis is a 80 y.o. male here as requested by [name], MD for Tremor. PMHx A. fib on Coumadin following valvular repair, tremors, memory difficulties, daytime somnolence, secondary hypercoagulable state, muscle cramping, migraines, malaise and fatigue.  Patient was seen in 2017 for tremors and following is a review of those records: he started noticing tremors after his heart surgery in 2005, more in the face and hands, also in the legs occasionally, affecting his handwriting, difficulty eating, feeling in his face as well, stress makes it worse in fact very affected by stress and anxiety, stressful environment, he has a family history of tremor in his father, no caffeine or energy drinks, felt it was slowly progressive without any other focal deficits, his thyroid was tested.  Examination showed a high-frequency low amplitude postural tremor in the head and upper extremities with a kinetic component, no resting tremor.  At the time when he was seen in 2017 we discussed an MRI of the brain which he declined, we tried gabapentin as needed and we also discussed possibly trying propranolol, he did not follow-up after that so I am not unsure how these medications worked or if he even tried them.    I also reviewed Dr. Alan Ripper notes: Dr. Harrington Challenger stated the tremor is worsening and now affecting his voice, labs include normal TSH 2.24, normal vitamin D, CMP showed BUN 19 and creatinine 1.12 otherwise unremarkable, unremarkable CBC, B12 greater than 1500 and sed rate normal 7, labs were collected August 15, 2019.    Patient is here alone and states his tremor is worse. He did not try Neurontin because he read it was addictive. He feels very sleepy. He just had a sleep evaluation. He is worried the tremor is something else. He  feels rotten, tired a lot, no energy, no weakness per se, he feels sleepy in the daytime and problems sleeping, he naps a lot. He notices the tremor in his hands and also in his voice and head. He has some cramps in his limbs and hands. He has been trying magnesium which has not helped. He has not had any falls. He spent the winter "hunkered down" and he thinks maybe he has a lot of disuse. He notices the tremor when doing things, not really at rest. Writing is impossible. He feels tremors in his legs too "sometimes". The tremor is worse with stress. It is continuous.   Reviewed notes, labs and imaging from outside physicians, which showed: see above  Review of Systems: Patient complains of symptoms per HPI as well as the following symptoms tremors, fatigue, generalized weakness, daytime sleepiness Pertinent negatives and positives per HPI. All others negative.   Social History   Socioeconomic History  . Marital status: Single    Spouse name: Not on file  . Number of children: 2  . Years of education: 77  . Highest education level: Not on file  Occupational History  . Occupation: [employer]  Tobacco Use  . Smoking status: Never Smoker  . Smokeless tobacco: Never Used  . Tobacco comment: only tried a few cigarettes when he was younger  Substance and Sexual Activity  . Alcohol use: Yes    Comment: "probably a 6 pack of beer a year"  . Drug use: No  . Sexual activity: Not  on file  Other Topics Concern  . Not on file  Social History Narrative   Lives with girlfriend   Caffeine use: rarely    Right handed   Social Determinants of Health   Financial Resource Strain: Not on file  Food Insecurity: Not on file  Transportation Needs: Not on file  Physical Activity: Not on file  Stress: Not on file  Social Connections: Not on file  Intimate Partner Violence: Not on file    Family History  Problem Relation Age of Onset  . Heart disease Mother   . CAD Mother   .  Migraines Mother   . Heart failure Father   . Skin cancer Father   . Valvular heart disease Father        prolapsed valve  . Tremor Father        "probably had a bit of tremor in his old age"  . Heart failure Maternal Grandmother   . Pneumonia Maternal Grandfather   . Heart failure Paternal Grandmother   . Stroke Paternal Grandfather   . Asthma Daughter   . Epilepsy Son   . Parkinson's disease Neg Hx     Past Medical History:  Diagnosis Date  . CHF (congestive heart failure) ([place])   . Coronary artery disease   . GI bleed 2019   hospitalized at [facility] for one week  . Headache    since childhood  . Low blood sugar    since childhood, controlled by diet  . Mitral regurgitation   . Mitral valve prolapse   . Osteopenia 2021  . Paroxysmal atrial fibrillation [facility])     Patient Active Problem List   Diagnosis Date Noted  . GIB (gastrointestinal bleeding) 02/17/2017  . Rectal bleeding 02/16/2017  . 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  . Essential tremor 02/19/2015  . Status post mitral valve annuloplasty and MAZE 2005 12/12/2014  . HLD (hyperlipidemia) 05/05/2013  . DOE (dyspnea on exertion) 04/22/2013  . Fatigue 04/22/2013  . Atrial fibrillation ([place]) 05/08/2012  . Long term (current) use of anticoagulants 05/08/2012    Past Surgical History:  Procedure Laterality Date  . COLONOSCOPY WITH PROPOFOL N/A 02/19/2017   Procedure: COLONOSCOPY WITH PROPOFOL;  Surgeon: Wilford Corner, MD;  Location: [facility];  Service: Endoscopy;  Laterality: N/A;  . laser eye surgery for retina detachment    . MITRAL VALVE REPAIR  01/2003  . monitor  02/05/2006  . polyp removal    . TONSILLECTOMY    . tooth removal     as a teenager    Current Outpatient Medications  Medication Sig Dispense Refill  . acetaminophen (TYLENOL) 500 MG tablet Take 500 mg by mouth every 6 (six) hours as needed for mild pain or headache.    . Ascorbic Acid (VITAMIN C IMMUNE HEALTH  PO) Take 500 mg by mouth.    . B Complex Vitamins (VITAMIN B COMPLEX PO) Take by mouth.    . Calcium Citrate-Vitamin D (CALCIUM + D PO) Take 400 mg by mouth daily.    . Cholecalciferol (D3) 50 MCG (2000 UT) TABS Take by mouth daily.    . Garlic 315 MG TABS Take by mouth.    . Magnesium 250 MG TABS Take by mouth.    . melatonin 3 MG TABS tablet Take 3 mg by mouth at bedtime as needed.    . Multiple Vitamin (MULTIVITAMIN) capsule Take 1 capsule by mouth. sometimes    . omega-3 acid ethyl esters (LOVAZA)  1 g capsule Take 1 g by mouth 2 (two) times daily.    . Omega-3 Fatty Acids (FISH OIL PO) Take 750 mg by mouth.    . warfarin (COUMADIN) 5 MG tablet TAKE 1/2 TO 1 TABLET DAILY AS DIRECTED BY COUMADIN CLINIC. 90 tablet 1   No current facility-administered medications for this visit.    Allergies as of 03/21/2020 - Review Complete 02/20/2020  Allergen Reaction Noted  . Prednisone Other (See Comments) 05/03/2013  . Cortisone Palpitations 11/16/2019    Vitals: There were no vitals taken for this visit. Last Weight:  Wt Readings from Last 1 Encounters:  02/12/20 146 lb (66.2 kg)   Last Height:   Ht Readings from Last 1 Encounters:  02/12/20 5\' 7"  (1.702 m)     Physical exam: Exam: Gen: NAD, conversant                     CV: RRR, no MRG. No Carotid Bruits. No peripheral edema, warm, nontender Eyes: Conjunctivae clear without exudates or hemorrhage  Neuro: Detailed Neurologic Exam  Speech:    Speech is normal; fluent and spontaneous with normal comprehension.  Cognition:    The patient is oriented to person, place, and time;     recent and remote memory intact;     language fluent;     normal attention, concentration,     fund of knowledge Cranial Nerves:    The pupils are equal, round, and reactive to light. Pupils too small to visualize fundi.  The fundi are normal and spontaneous venous pulsations are present. Visual fields are full to finger confrontation. Extraocular  movements are intact. Trigeminal sensation is intact and the muscles of mastication are normal. The face is symmetric. The palate elevates in the midline. Hearing intact. Voice is normal. Shoulder shrug is normal. The tongue has normal motion without fasciculations.   Coordination:    No dysmetria   Gait:    Heel-toe gait are normal.  Good stride and arm swing. Slight upper back rounding and neck flexion when walking.   Motor Observation:    Tremor head, voice, hands, chin action and postural >> resting. Tone:    Normal muscle tone.  No cogwheeling.       Strength:    Strength is V/V in the upper and lower limbs.      Sensation: intact to LT     Reflex Exam:  DTR's: absent AJ, 1+ biceps and patellars    Deep tendon reflexes in the upper and lower extremities are symmetrical bilaterally.   Toes:    The toes are equiv bilaterally.   Clonus:    Clonus is absent.    Assessment/Plan:  Really lovely gentleman with tremors. He has head, chin, voice and hand postural >> resting tremors. Progressive.  I don't see any other parkinsonian features, his stride and arm swing are good, tone is normal. I still think this is essential tremor. He is so fatigued I discouraged him from trying any medication for essential tremor at least until the sleep test comes back and any sleep disorder is treated. If sleep study is negative I will order MRI of the brain and also bloodwork to rule out neurologic causes of his general fatigue and weakness and an emg/ncs but if the sleep test is positive I would wait and come back in a few months when feeling better on on a cpap and we can discuss medications for essential tremor if needed. We had a  long discussion about this, he is in agreement. He is worried due to his fatigue and sleepiness, sounds like he does have sleep apnea but will wait to review results before any further testing or treatment.    Cc: [name], MD  Sarina Ill, MD  [facility] [address] [place], [place] [address]  Phone [phone] Fax [phone]  I spent over 60 minutes of face-to-face and non-face-to-face time with patient on the  No diagnosis found. diagnosis.  This included previsit chart review, lab review, study review, order entry, electronic health record documentation, patient education on the different diagnostic and therapeutic options, counseling and coordination of care, risks and benefits of management, compliance, or risk factor reduction

## 2020-03-26 ENCOUNTER — Other Ambulatory Visit: Payer: Self-pay

## 2020-03-26 ENCOUNTER — Ambulatory Visit (INDEPENDENT_AMBULATORY_CARE_PROVIDER_SITE_OTHER): Payer: Medicare Other

## 2020-03-26 DIAGNOSIS — Z7901 Long term (current) use of anticoagulants: Secondary | ICD-10-CM

## 2020-03-26 LAB — POCT INR: INR: 1.1 — AB (ref 2.0–3.0)

## 2020-03-26 NOTE — Patient Instructions (Signed)
Take 2 tablets tonight and then continue 1 tablet daily.  Repeat INR in 1 week.  Told to reduce Coumadin to 0.5 tablets Daily per Dr Harrington Challenger, because of Antibiotic.

## 2020-04-02 ENCOUNTER — Ambulatory Visit (INDEPENDENT_AMBULATORY_CARE_PROVIDER_SITE_OTHER): Payer: Medicare Other

## 2020-04-02 ENCOUNTER — Other Ambulatory Visit: Payer: Self-pay

## 2020-04-02 DIAGNOSIS — Z7901 Long term (current) use of anticoagulants: Secondary | ICD-10-CM | POA: Diagnosis not present

## 2020-04-02 LAB — POCT INR: INR: 2 (ref 2.0–3.0)

## 2020-04-02 NOTE — Patient Instructions (Signed)
continue 1 tablet daily.  Repeat INR in 4 weeks

## 2020-04-06 ENCOUNTER — Other Ambulatory Visit: Payer: Self-pay

## 2020-04-06 ENCOUNTER — Ambulatory Visit (INDEPENDENT_AMBULATORY_CARE_PROVIDER_SITE_OTHER): Payer: Medicare Other | Admitting: Cardiovascular Disease

## 2020-04-06 ENCOUNTER — Encounter: Payer: Self-pay | Admitting: Cardiovascular Disease

## 2020-04-06 VITALS — BP 154/88 | HR 94 | Ht 68.0 in | Wt 139.2 lb

## 2020-04-06 DIAGNOSIS — G4733 Obstructive sleep apnea (adult) (pediatric): Secondary | ICD-10-CM

## 2020-04-06 DIAGNOSIS — R5383 Other fatigue: Secondary | ICD-10-CM

## 2020-04-06 DIAGNOSIS — G250 Essential tremor: Secondary | ICD-10-CM | POA: Diagnosis not present

## 2020-04-06 DIAGNOSIS — I4891 Unspecified atrial fibrillation: Secondary | ICD-10-CM

## 2020-04-06 DIAGNOSIS — I739 Peripheral vascular disease, unspecified: Secondary | ICD-10-CM

## 2020-04-06 DIAGNOSIS — I480 Paroxysmal atrial fibrillation: Secondary | ICD-10-CM

## 2020-04-06 DIAGNOSIS — Z9889 Other specified postprocedural states: Secondary | ICD-10-CM | POA: Diagnosis not present

## 2020-04-06 NOTE — Progress Notes (Signed)
Patient ID: Austin Davis, male   DOB: [birth year], 80 y.o.   MRN: [id number]     Cardiology Office Note   Date:  04/08/2020   ID:  Austin Davis, DOB May 27, 1940, MRN [id number]  PCP:  [name], MD  Cardiologist:   Sanda Klein, MD   Chief Complaint  Patient presents with  . Fatigue      History of Present Illness: Austin Davis is a 80 y.o. male who presents for f/u of valvular heart disease and paroxysmal atrial fibrillation. He underwent mitral valve repair and Maze procedure by Dr. Prescott Gum in 2005 for mitral valve prolapse with severe mitral regurgitation and congestive heart failure.  Prior to surgery in 2005, he had a severely dialted left atrium, 4+ mitral insufficiency and moderate pulmonary hypertension (PA  62/32). In 2015, echo showed left ventricular ejection fraction of 50-55%, mild mitral insufficiency and a mildly dilated left atrium. There was a description of "apical hypokinesis" but he had normal coronary arteries by preoperative cardiac catheterization, has not had angina and had normal ECG at that time of that report.  He continues to complain of fatigue.  He feels very weak and tired all the time.  He reports "fighting to stay awake".  Thinking is "foggy".  Sometimes he has a flushing sensation, feels very warm from head to toe.  He has not had syncope.  He has lost weight.  Sleep continues to be poor at night, but he takes a nap every afternoon and easily falls asleep throughout the day.  Nevertheless he is able to read a book without falling asleep in his chair.  Frequently falls asleep in front of the television.   He has been a loud snorer his whole life.  He is not obese.  Recent lab work prompted by his complaints of fatigue show that he has normal thyroid function and a surprisingly high hemoglobin of 16.3.    He underwent a home sleep study in October and CPAP titration in January which showed that he does have sleep apnea, particularly during  REM sleep.  He has not yet received his CPAP device which is "on backorder".  Blood pressure is marginally elevated today, but at home it is typically in the low 130s/low 80s.  He has not had any falls, injuries or serious bleeding while on warfarin anticoagulation.  Sees Dr. Jaynee Eagles in neurology for worsening essential tremor, but is not taking any specific medications.  Past Medical History:  Diagnosis Date  . CHF (congestive heart failure) ([place])   . Coronary artery disease   . GI bleed 2019   hospitalized at [facility] for one week  . Headache    since childhood  . Low blood sugar    since childhood, controlled by diet  . Mitral regurgitation   . Mitral valve prolapse   . Osteopenia 2021  . Paroxysmal atrial fibrillation [facility])     Past Surgical History:  Procedure Laterality Date  . COLONOSCOPY WITH PROPOFOL N/A 02/19/2017   Procedure: COLONOSCOPY WITH PROPOFOL;  Surgeon: Wilford Corner, MD;  Location: [facility];  Service: Endoscopy;  Laterality: N/A;  . laser eye surgery for retina detachment    . MITRAL VALVE REPAIR  01/2003  . monitor  02/05/2006  . polyp removal    . TONSILLECTOMY    . tooth removal     as a teenager     Current Outpatient Medications  Medication Sig Dispense Refill  . acetaminophen (TYLENOL) 500  MG tablet Take 500 mg by mouth every 6 (six) hours as needed for mild pain or headache.    . Ascorbic Acid (VITAMIN C IMMUNE HEALTH PO) Take 500 mg by mouth.    . B Complex Vitamins (VITAMIN B COMPLEX PO) Take by mouth.    . Calcium Citrate-Vitamin D (CALCIUM + D PO) Take 400 mg by mouth daily.    . Cholecalciferol (D3) 50 MCG (2000 UT) TABS Take by mouth daily.    . Garlic 263 MG TABS Take by mouth.    . Magnesium 250 MG TABS Take by mouth.    . melatonin 3 MG TABS tablet Take 3 mg by mouth at bedtime as needed.    . Multiple Vitamin (MULTIVITAMIN) capsule Take 1 capsule by mouth. sometimes    . omega-3 acid ethyl esters (LOVAZA) 1 g capsule Take 1 g  by mouth 2 (two) times daily.    . Omega-3 Fatty Acids (FISH OIL PO) Take 750 mg by mouth.    . warfarin (COUMADIN) 5 MG tablet TAKE 1/2 TO 1 TABLET DAILY AS DIRECTED BY COUMADIN CLINIC. 90 tablet 1   No current facility-administered medications for this visit.    Allergies:   Prednisone and Cortisone    Social History:  The patient  reports that he has never smoked. He has never used smokeless tobacco. He reports current alcohol use. He reports that he does not use drugs.   Family History:  The patient's family history includes Asthma in his daughter; CAD in his mother; Epilepsy in his son; Heart disease in his mother; Heart failure in his father, maternal grandmother, and paternal grandmother; Migraines in his mother; Pneumonia in his maternal grandfather; Skin cancer in his father; Stroke in his paternal grandfather; Tremor in his father; Valvular heart disease in his father.    ROS:  Please see the history of present illness.   All other systems are reviewed and are negative.   PHYSICAL EXAM: VS:  BP (!) 154/88   Pulse 94   Ht 5\' 8"  (1.727 m)   Wt 139 lb 3.2 oz (63.1 kg)   SpO2 97%   BMI 21.17 kg/m  , BMI Body mass index is 21.17 kg/m.   General: Alert, oriented x3, no distress, appears very lean Head: no evidence of trauma, PERRL, EOMI, no exophtalmos or lid lag, no myxedema, no xanthelasma; normal ears, nose and oropharynx Neck: normal jugular venous pulsations and no hepatojugular reflux; brisk carotid pulses without delay and no carotid bruits Chest: clear to auscultation, no signs of consolidation by percussion or palpation, normal fremitus, symmetrical and full respiratory excursions Cardiovascular: normal position and quality of the apical impulse, regular rhythm, normal first and second heart sounds, no murmurs, rubs or gallops Abdomen: no tenderness or distention, no masses by palpation, no abnormal pulsatility or arterial bruits, normal bowel sounds, no  hepatosplenomegaly Extremities: no clubbing, cyanosis or edema; 2+ radial, ulnar and brachial pulses bilaterally; 2+ right femoral, posterior tibial and dorsalis pedis pulses; 2+ left femoral, posterior tibial and dorsalis pedis pulses; no subclavian or femoral bruits Neurological: grossly nonfocal except for bilateral hand tremor at rest Psych: Normal mood and affect'affect  EKG:  EKG is ordered today.  It shows normal sinus rhythm and is a completely normal tracing, although borderline tachycardic for somewhat at rest at 94.  Recent Labs: 04/06/2020: ALT 23; BUN 21; Creatinine, Ser 0.92; Hemoglobin 15.8; Platelets 230; Potassium 5.0; Sodium 143; TSH 2.160    Lipid Panel  Component Value Date/Time   CHOL 180 01/30/2015 1115   TRIG 75 01/30/2015 1115   HDL 55 01/30/2015 1115   CHOLHDL 3.3 01/30/2015 1115   VLDL 15 01/30/2015 1115   LDLCALC 110 01/30/2015 1115      Wt Readings from Last 3 Encounters:  04/06/20 139 lb 3.2 oz (63.1 kg)  02/12/20 146 lb (66.2 kg)  11/16/19 151 lb (68.5 kg)     ASSESSMENT AND PLAN:  1. Afib: Maintaining normal sinus rhythm and we have not documented any atrial fibrillation since he had a surgical Maze procedure. 2. S/P MV repair:  history of mitral valve repair for severe regurgitation due to mitral valve prolapse in 2005.  He does not have overt evidence of heart failure on exam. Most recent echo shows LVEF 45-50%, lower than 50-55%on his previous echo, but when placed side-by-side the studies are very similar.  3. Essential tremor: I be reluctant to prescribe beta-blockers since he has such prominent symptoms of fatigue. 4. Obstructive sleep apnea: We will try to expedite delivery of his CPAP device and encourage compliance with treatment. 5. PAD: Incompressible calcified vessels in the lower extremities, unable to calculate ABI, but the bilateral toe brachial indices are normal.  No complaints of claudication. 6.  Fatigue: Recheck hemoglobin, TSH  (especially since he is losing weight and has flushing sensation) and routine chemistries.   Current medicines are reviewed at length with the patient today.  The patient does not have concerns regarding medicines.  The following changes have been made:  no change  Labs/ tests ordered today include:   Orders Placed This Encounter  Procedures  . Comprehensive metabolic panel  . TSH  . CBC  . EKG 12-Lead     Patient Instructions  Medication Instructions:  No changes *If you need a refill on your cardiac medications before your next appointment, please call your pharmacy*   Lab Work: Your provider would like for you to have the following labs today: TSH, CMET and CBC  If you have labs (blood work) drawn today and your tests are completely normal, you will receive your results only by: Marland Kitchen MyChart Message (if you have MyChart) OR . A paper copy in the mail If you have any lab test that is abnormal or we need to change your treatment, we will call you to review the results.   Testing/Procedures: None ordered   Follow-Up: At [facility], you and your health needs are our priority.  As part of our continuing mission to provide you with exceptional heart care, we have created designated Provider Care Teams.  These Care Teams include your primary Cardiologist (physician) and Advanced Practice Providers (APPs -  Physician Assistants and Nurse Practitioners) who all work together to provide you with the care you need, when you need it.  We recommend signing up for the patient portal called "MyChart".  Sign up information is provided on this After Visit Summary.  MyChart is used to connect with patients for Virtual Visits (Telemedicine).  Patients are able to view lab/test results, encounter notes, upcoming appointments, etc.  Non-urgent messages can be sent to your provider as well.   To learn more about what you can do with MyChart, go to [email or web address].    Your next  appointment:   3 month(s)  The format for your next appointment:   In Person  Provider:   You may see Sanda Klein, MD or one of the following Advanced Practice Providers on your designated Care Team:  Almyra Deforest, PA-C  Fabian Sharp, PA-C or   Roby Lofts, PA-C     Signed, Sanda Klein, MD  04/08/2020 8:58 PM    Sanda Klein, MD, [facility] [phone] office [phone] pager

## 2020-04-06 NOTE — Patient Instructions (Signed)
Medication Instructions:  No changes *If you need a refill on your cardiac medications before your next appointment, please call your pharmacy*   Lab Work: Your provider would like for you to have the following labs today: TSH, CMET and CBC  If you have labs (blood work) drawn today and your tests are completely normal, you will receive your results only by: Marland Kitchen MyChart Message (if you have MyChart) OR . A paper copy in the mail If you have any lab test that is abnormal or we need to change your treatment, we will call you to review the results.   Testing/Procedures: None ordered   Follow-Up: At [facility], you and your health needs are our priority.  As part of our continuing mission to provide you with exceptional heart care, we have created designated Provider Care Teams.  These Care Teams include your primary Cardiologist (physician) and Advanced Practice Providers (APPs -  Physician Assistants and Nurse Practitioners) who all work together to provide you with the care you need, when you need it.  We recommend signing up for the patient portal called "MyChart".  Sign up information is provided on this After Visit Summary.  MyChart is used to connect with patients for Virtual Visits (Telemedicine).  Patients are able to view lab/test results, encounter notes, upcoming appointments, etc.  Non-urgent messages can be sent to your provider as well.   To learn more about what you can do with MyChart, go to [email or web address].    Your next appointment:   3 month(s)  The format for your next appointment:   In Person  Provider:   You may see Sanda Klein, MD or one of the following Advanced Practice Providers on your designated Care Team:    Almyra Deforest, PA-C  Fabian Sharp, PA-C or   Roby Lofts, [place]

## 2020-04-07 LAB — COMPREHENSIVE METABOLIC PANEL
ALT: 23 IU/L (ref 0–44)
AST: 29 IU/L (ref 0–40)
Albumin/Globulin Ratio: 2.4 — ABNORMAL HIGH (ref 1.2–2.2)
Albumin: 4.7 g/dL (ref 3.7–4.7)
Alkaline Phosphatase: 79 IU/L (ref 44–121)
BUN/Creatinine Ratio: 23 (ref 10–24)
BUN: 21 mg/dL (ref 8–27)
Bilirubin Total: 0.3 mg/dL (ref 0.0–1.2)
CO2: 25 mmol/L (ref 20–29)
Calcium: 9.5 mg/dL (ref 8.6–10.2)
Chloride: 100 mmol/L (ref 96–106)
Creatinine, Ser: 0.92 mg/dL (ref 0.76–1.27)
Globulin, Total: 2 g/dL (ref 1.5–4.5)
Glucose: 94 mg/dL (ref 65–99)
Potassium: 5 mmol/L (ref 3.5–5.2)
Sodium: 143 mmol/L (ref 134–144)
Total Protein: 6.7 g/dL (ref 6.0–8.5)
eGFR: 85 mL/min/{1.73_m2} (ref >59)

## 2020-04-07 LAB — CBC
Hematocrit: 46.1 % (ref 37.5–51.0)
Hemoglobin: 15.8 g/dL (ref 13.0–17.7)
MCH: 32.2 pg (ref 26.6–33.0)
MCHC: 34.3 g/dL (ref 31.5–35.7)
MCV: 94 fL (ref 79–97)
Platelets: 230 10*3/uL (ref 150–450)
RBC: 4.9 x10E6/uL (ref 4.14–5.80)
RDW: 12.8 % (ref 11.6–15.4)
WBC: 6.7 10*3/uL (ref 3.4–10.8)

## 2020-04-07 LAB — TSH: TSH: 2.16 u[IU]/mL (ref 0.450–4.500)

## 2020-04-08 ENCOUNTER — Encounter: Payer: Self-pay | Admitting: Cardiovascular Disease

## 2020-04-09 ENCOUNTER — Telehealth: Payer: Self-pay | Admitting: Cardiovascular Disease

## 2020-04-09 NOTE — Telephone Encounter (Signed)
Patient made aware of results and verbalized understanding.  See lab result note

## 2020-04-09 NOTE — Telephone Encounter (Signed)
Patient returning call for lab results. 

## 2020-04-27 DIAGNOSIS — U071 COVID-19: Secondary | ICD-10-CM | POA: Diagnosis not present

## 2020-04-30 DIAGNOSIS — R634 Abnormal weight loss: Secondary | ICD-10-CM | POA: Diagnosis not present

## 2020-04-30 DIAGNOSIS — R000 Tachycardia, unspecified: Secondary | ICD-10-CM | POA: Diagnosis not present

## 2020-04-30 DIAGNOSIS — R059 Cough, unspecified: Secondary | ICD-10-CM | POA: Diagnosis not present

## 2020-04-30 DIAGNOSIS — R531 Weakness: Secondary | ICD-10-CM | POA: Diagnosis not present

## 2020-05-01 ENCOUNTER — Other Ambulatory Visit: Payer: Self-pay | Admitting: Family Medicine

## 2020-05-01 ENCOUNTER — Other Ambulatory Visit: Payer: Self-pay | Admitting: Surgery

## 2020-05-01 ENCOUNTER — Other Ambulatory Visit (HOSPITAL_BASED_OUTPATIENT_CLINIC_OR_DEPARTMENT_OTHER): Payer: Self-pay | Admitting: Family Medicine

## 2020-05-01 DIAGNOSIS — R634 Abnormal weight loss: Secondary | ICD-10-CM

## 2020-05-01 DIAGNOSIS — R000 Tachycardia, unspecified: Secondary | ICD-10-CM | POA: Diagnosis not present

## 2020-05-01 DIAGNOSIS — R531 Weakness: Secondary | ICD-10-CM | POA: Diagnosis not present

## 2020-05-01 DIAGNOSIS — R059 Cough, unspecified: Secondary | ICD-10-CM | POA: Diagnosis not present

## 2020-05-02 ENCOUNTER — Other Ambulatory Visit: Payer: Self-pay

## 2020-05-02 ENCOUNTER — Ambulatory Visit (INDEPENDENT_AMBULATORY_CARE_PROVIDER_SITE_OTHER): Payer: Medicare Other

## 2020-05-02 DIAGNOSIS — Z7901 Long term (current) use of anticoagulants: Secondary | ICD-10-CM

## 2020-05-02 LAB — POCT INR: INR: 2.2 (ref 2.0–3.0)

## 2020-05-02 NOTE — Patient Instructions (Signed)
continue 1 tablet daily.  Repeat INR in 6 weeks

## 2020-05-09 DIAGNOSIS — Z01812 Encounter for preprocedural laboratory examination: Secondary | ICD-10-CM | POA: Diagnosis not present

## 2020-05-09 DIAGNOSIS — R634 Abnormal weight loss: Secondary | ICD-10-CM | POA: Diagnosis not present

## 2020-05-10 ENCOUNTER — Encounter (HOSPITAL_BASED_OUTPATIENT_CLINIC_OR_DEPARTMENT_OTHER): Payer: Self-pay

## 2020-05-10 ENCOUNTER — Ambulatory Visit (HOSPITAL_BASED_OUTPATIENT_CLINIC_OR_DEPARTMENT_OTHER)
Admission: RE | Admit: 2020-05-10 | Discharge: 2020-05-10 | Disposition: A | Discharge status: Home / Self Care | Payer: Medicare Other | Source: Ambulatory Visit | Attending: Family Medicine | Admitting: Family Medicine

## 2020-05-10 ENCOUNTER — Other Ambulatory Visit: Payer: Self-pay

## 2020-05-10 DIAGNOSIS — R634 Abnormal weight loss: Secondary | ICD-10-CM | POA: Insufficient documentation

## 2020-05-10 DIAGNOSIS — R109 Unspecified abdominal pain: Secondary | ICD-10-CM | POA: Diagnosis not present

## 2020-05-10 IMAGING — CT CT ABD-PELV W/ CM
2 of 5 series · 15 of 46 positions shown, 17 images · IV contrast (Omnipaque)
Comparison: [DATE] CT abdomen/pelvis.

CLINICAL DATA: Abdominal pain and weight loss of 40 pounds over the
prior year. CHF.

EXAM:
CT ABDOMEN AND PELVIS WITH CONTRAST
TECHNIQUE: Multidetector CT imaging of the abdomen and pelvis was performed
using the standard protocol following bolus administration of
intravenous contrast.
CONTRAST:  100mL OMNIPAQUE IOHEXOL 300 MG/ML  SOLN

[Series 2: axial st · axial · 0.71mm/px · z∈[-455,-20]mm · 12 of 97 slices shown, 14 images]
[im 5/97  soft-tissue]
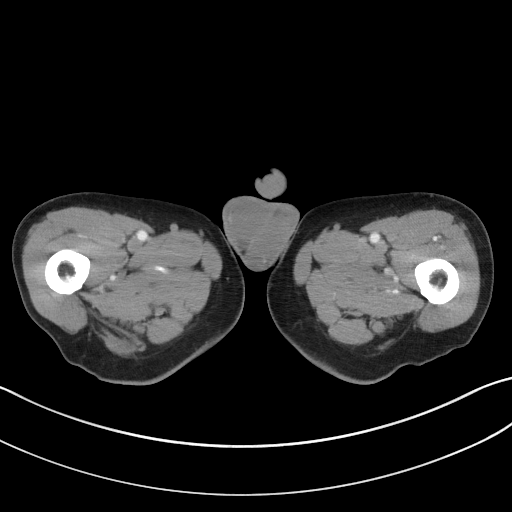
[im 5/97  bone]
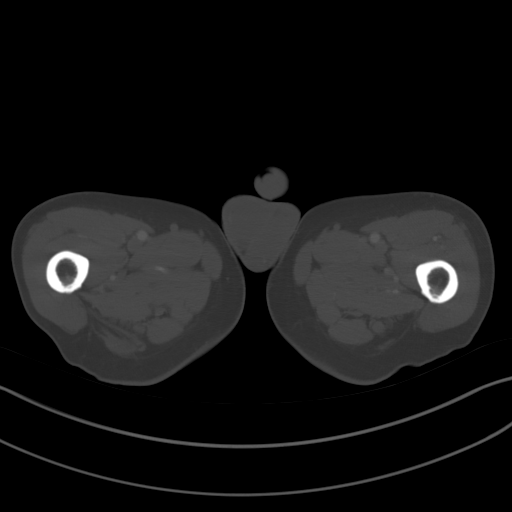
[im 15/97  soft-tissue]
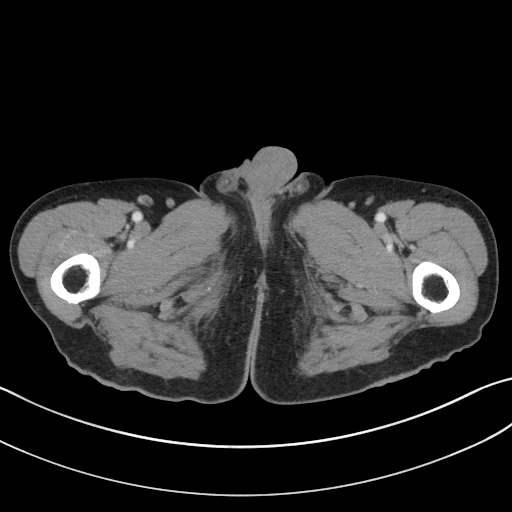
[im 20/97  soft-tissue]
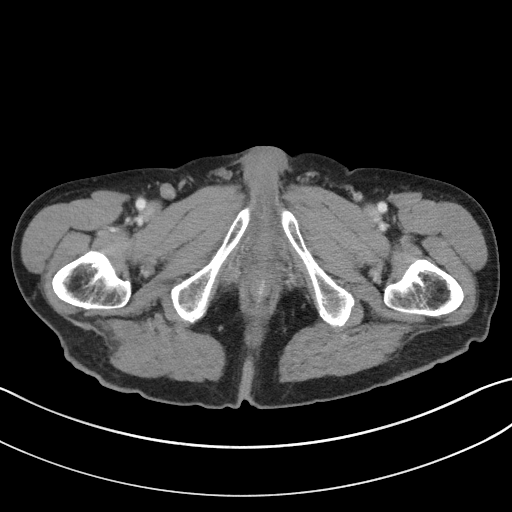
[im 29/97  soft-tissue]
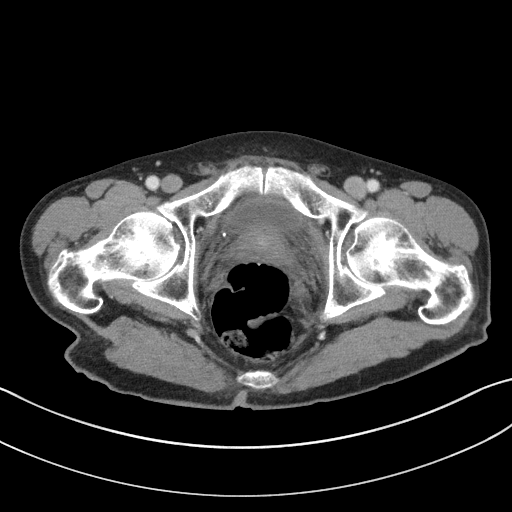
[im 39/97  soft-tissue]
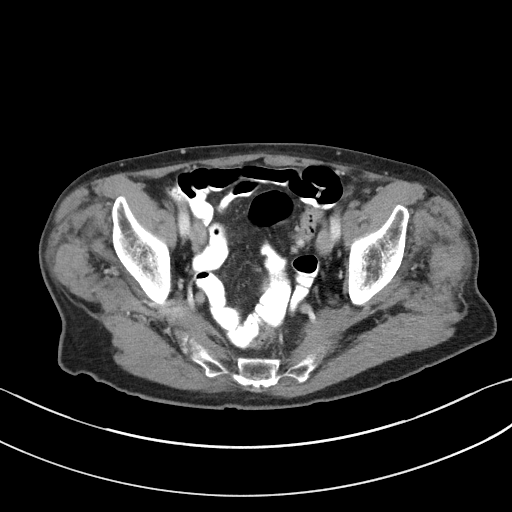
[im 44/97  soft-tissue]
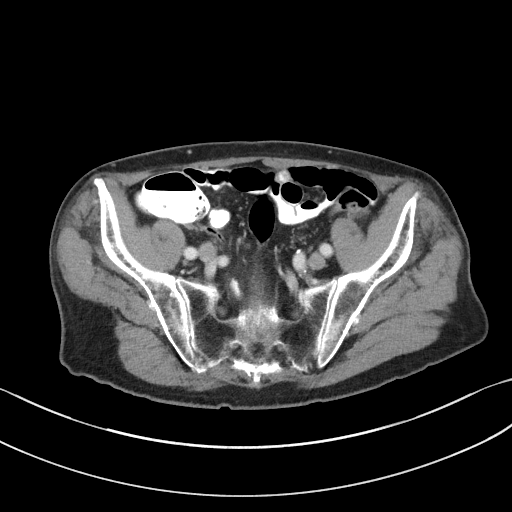
[im 53/97  soft-tissue]
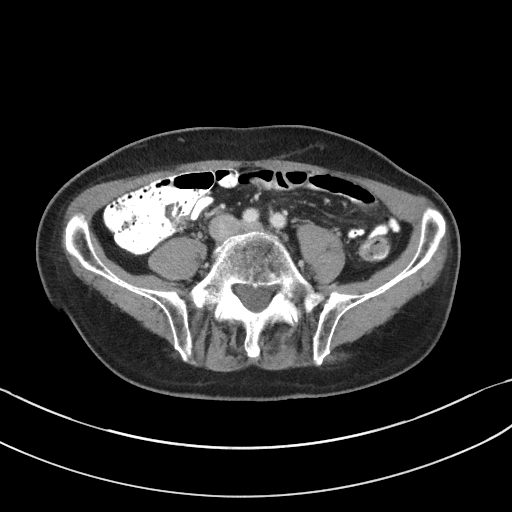
[im 58/97  soft-tissue]
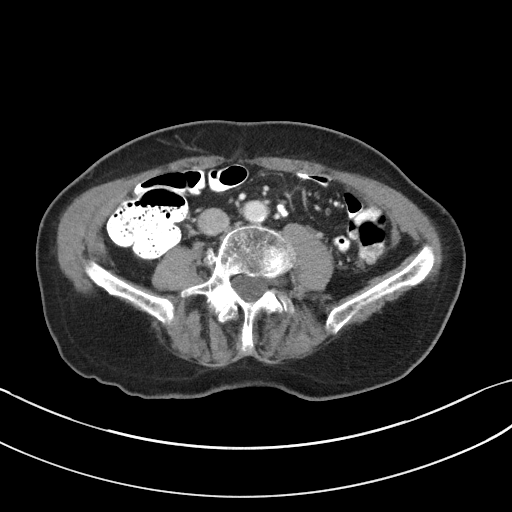
[im 68/97  soft-tissue]
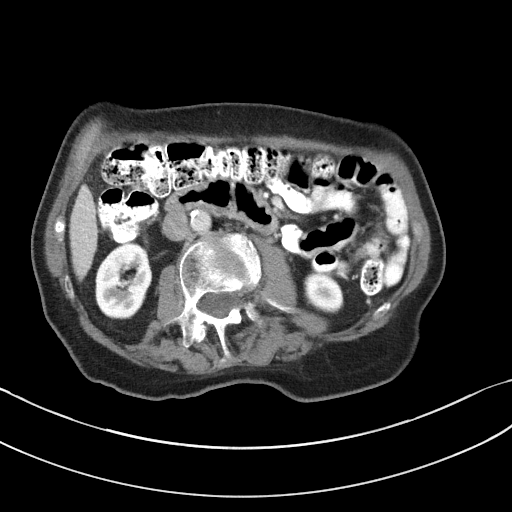
[im 68/97  bone]
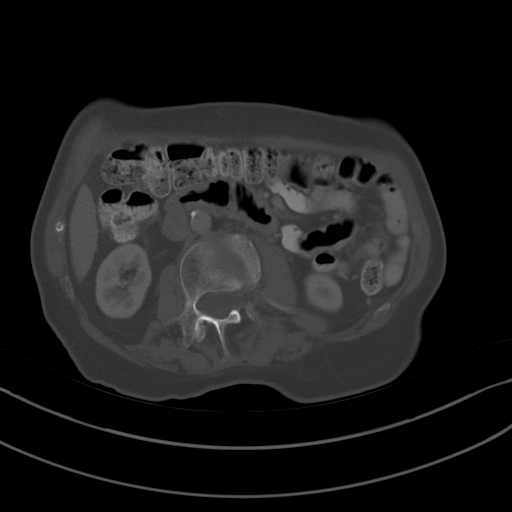
[im 77/97  soft-tissue]
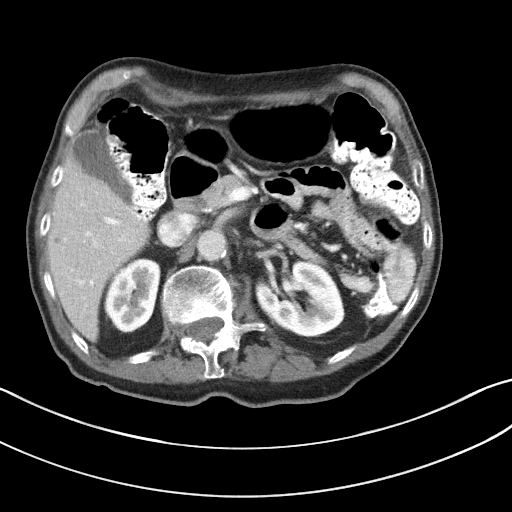
[im 82/97  soft-tissue]
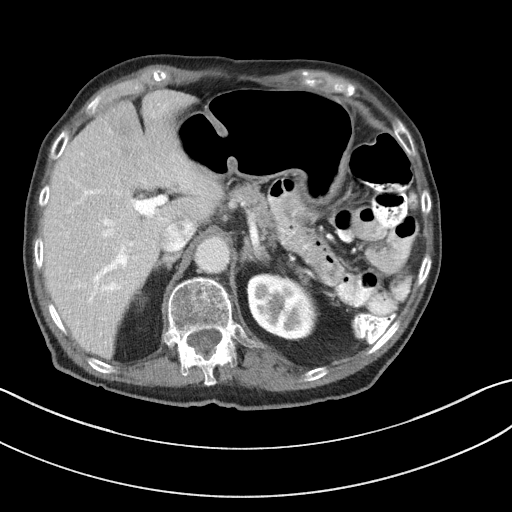
[im 92/97  soft-tissue]
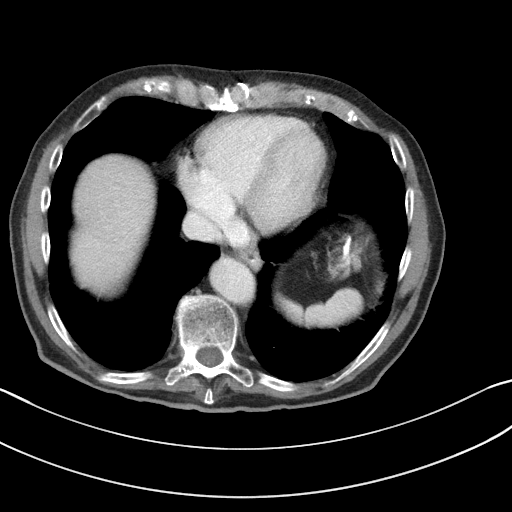

[Series 5: coronal st · coronal · 0.74mm/px · 3 of 84 slices shown]
[im 28/84  soft-tissue]
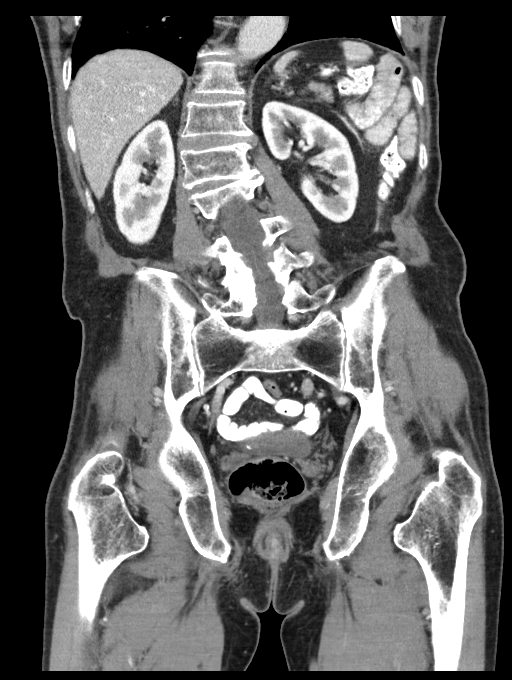
[im 37/84  soft-tissue]
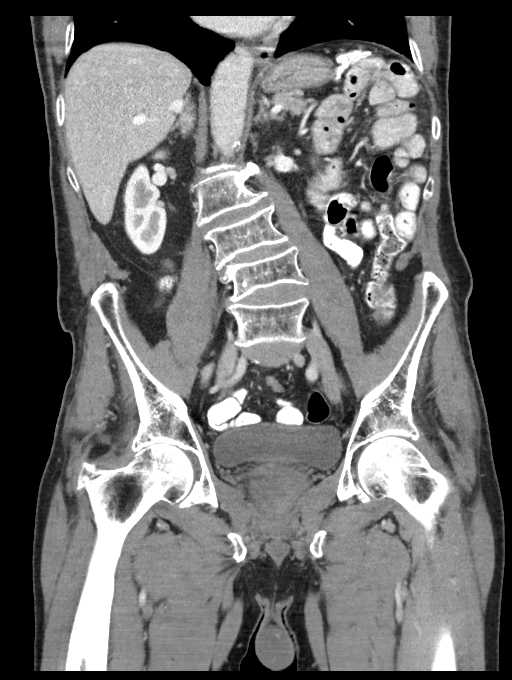
[im 47/84  soft-tissue]
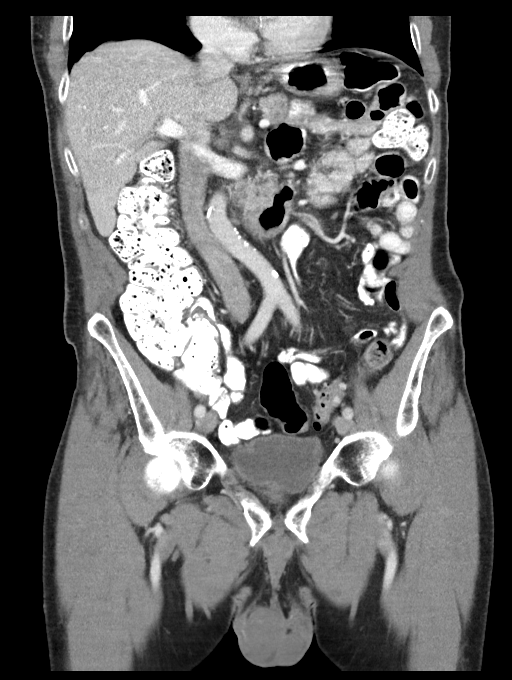

[15 of 46 positions shown; findings below may reference images not displayed]

FINDINGS: Lower chest: No significant pulmonary nodules or acute consolidative
airspace disease. Intact lower sternotomy wires.

Hepatobiliary: Normal liver size. Scattered subcentimeter hypodense
liver lesions are too small to characterize and are unchanged,
considered benign. No new liver lesions. Normal gallbladder with no
radiopaque cholelithiasis. No biliary ductal dilatation.

Pancreas: Normal, with no mass or duct dilation.

Spleen: Normal size. No mass.

Adrenals/Urinary Tract: Normal adrenals. Normal kidneys with no
hydronephrosis and no renal mass. Several (greater than 5) small
calcified stones layering in the bladder, largest 4 mm, new.
Otherwise normal bladder.

Stomach/Bowel: Normal non-distended stomach. Normal caliber small
bowel with no small bowel wall thickening. Normal appendix. Mild
sigmoid diverticulosis with no large bowel wall thickening or
significant pericolonic fat stranding. Oral contrast transits to the
left colon.

Vascular/Lymphatic: Atherosclerotic nonaneurysmal abdominal aorta.
Patent portal, splenic, hepatic and renal veins. No pathologically
enlarged lymph nodes in the abdomen or pelvis.

Reproductive: Mild prostatomegaly.

Other: No pneumoperitoneum, ascites or focal fluid collection.

Musculoskeletal: No aggressive appearing focal osseous lesions.
Mild-to-moderate thoracolumbar spondylosis.
IMPRESSION: 1. No acute abnormality. No evidence of bowel obstruction or acute
bowel inflammation. Mild sigmoid diverticulosis, with no evidence of
acute diverticulitis.
2. Several small calcified stones layering in the bladder, largest 4
mm, new. No hydronephrosis. Mild prostatomegaly.
3. Aortic Atherosclerosis ([FL]-[FL]).

## 2020-05-10 MED ORDER — IOHEXOL 300 MG/ML  SOLN
100.0000 mL | Freq: Once | INTRAMUSCULAR | Status: AC | PRN
  Administered 2020-05-10: 100 mL via INTRAVENOUS

## 2020-05-16 DIAGNOSIS — Z131 Encounter for screening for diabetes mellitus: Secondary | ICD-10-CM | POA: Diagnosis not present

## 2020-05-16 DIAGNOSIS — R899 Unspecified abnormal finding in specimens from other organs, systems and tissues: Secondary | ICD-10-CM | POA: Diagnosis not present

## 2020-05-16 DIAGNOSIS — D6859 Other primary thrombophilia: Secondary | ICD-10-CM | POA: Diagnosis not present

## 2020-05-16 DIAGNOSIS — I482 Chronic atrial fibrillation, unspecified: Secondary | ICD-10-CM | POA: Diagnosis not present

## 2020-05-16 DIAGNOSIS — Z6822 Body mass index (BMI) 22.0-22.9, adult: Secondary | ICD-10-CM | POA: Diagnosis not present

## 2020-05-16 DIAGNOSIS — R251 Tremor, unspecified: Secondary | ICD-10-CM | POA: Diagnosis not present

## 2020-05-16 DIAGNOSIS — R002 Palpitations: Secondary | ICD-10-CM | POA: Diagnosis not present

## 2020-05-16 DIAGNOSIS — R634 Abnormal weight loss: Secondary | ICD-10-CM | POA: Diagnosis not present

## 2020-05-24 DIAGNOSIS — Z23 Encounter for immunization: Secondary | ICD-10-CM | POA: Diagnosis not present

## 2020-05-30 DIAGNOSIS — R899 Unspecified abnormal finding in specimens from other organs, systems and tissues: Secondary | ICD-10-CM | POA: Diagnosis not present

## 2020-05-30 DIAGNOSIS — R251 Tremor, unspecified: Secondary | ICD-10-CM | POA: Diagnosis not present

## 2020-05-30 DIAGNOSIS — R634 Abnormal weight loss: Secondary | ICD-10-CM | POA: Diagnosis not present

## 2020-05-30 DIAGNOSIS — R002 Palpitations: Secondary | ICD-10-CM | POA: Diagnosis not present

## 2020-05-30 DIAGNOSIS — I482 Chronic atrial fibrillation, unspecified: Secondary | ICD-10-CM | POA: Diagnosis not present

## 2020-05-30 DIAGNOSIS — Z6822 Body mass index (BMI) 22.0-22.9, adult: Secondary | ICD-10-CM | POA: Diagnosis not present

## 2020-05-30 DIAGNOSIS — D6859 Other primary thrombophilia: Secondary | ICD-10-CM | POA: Diagnosis not present

## 2020-06-04 DIAGNOSIS — R899 Unspecified abnormal finding in specimens from other organs, systems and tissues: Secondary | ICD-10-CM | POA: Diagnosis not present

## 2020-06-13 ENCOUNTER — Other Ambulatory Visit: Payer: Self-pay

## 2020-06-13 ENCOUNTER — Ambulatory Visit (INDEPENDENT_AMBULATORY_CARE_PROVIDER_SITE_OTHER): Payer: Medicare Other

## 2020-06-13 DIAGNOSIS — Z7901 Long term (current) use of anticoagulants: Secondary | ICD-10-CM | POA: Diagnosis not present

## 2020-06-13 LAB — POCT INR: INR: 2.2 (ref 2.0–3.0)

## 2020-06-13 NOTE — Patient Instructions (Signed)
continue 1 tablet daily.  Repeat INR in 6 weeks

## 2020-06-20 DIAGNOSIS — D6859 Other primary thrombophilia: Secondary | ICD-10-CM | POA: Diagnosis not present

## 2020-06-20 DIAGNOSIS — Z6822 Body mass index (BMI) 22.0-22.9, adult: Secondary | ICD-10-CM | POA: Diagnosis not present

## 2020-06-20 DIAGNOSIS — R251 Tremor, unspecified: Secondary | ICD-10-CM | POA: Diagnosis not present

## 2020-06-20 DIAGNOSIS — R002 Palpitations: Secondary | ICD-10-CM | POA: Diagnosis not present

## 2020-06-20 DIAGNOSIS — R899 Unspecified abnormal finding in specimens from other organs, systems and tissues: Secondary | ICD-10-CM | POA: Diagnosis not present

## 2020-06-20 DIAGNOSIS — D447 Neoplasm of uncertain behavior of aortic body and other paraganglia: Secondary | ICD-10-CM | POA: Diagnosis not present

## 2020-06-20 DIAGNOSIS — I482 Chronic atrial fibrillation, unspecified: Secondary | ICD-10-CM | POA: Diagnosis not present

## 2020-06-20 DIAGNOSIS — R634 Abnormal weight loss: Secondary | ICD-10-CM | POA: Diagnosis not present

## 2020-06-26 ENCOUNTER — Other Ambulatory Visit (HOSPITAL_COMMUNITY): Payer: Self-pay | Admitting: Internal Medicine

## 2020-06-27 ENCOUNTER — Telehealth: Payer: Self-pay | Admitting: Pharmacist

## 2020-06-27 NOTE — Telephone Encounter (Signed)
Patient called to report stating doxazosin 4mg  daily. He was concern about potential interaction with warfarin or liver issues while taking Tylenol.  Recommendation given to take medication as prescribed, no interaction expected with warfarin, okay to take with tylenol but avoid > 3000mg  of APAP per day, and call prescribed if dizziness developed.

## 2020-06-28 ENCOUNTER — Telehealth: Payer: Self-pay | Admitting: *Deleted

## 2020-06-28 NOTE — Telephone Encounter (Signed)
Patient called in to inform that he is becoming weaker and weaker.  He is concerned because he has not received his CPAP machine yet, due to the backorder. He also informs that he was given RX for Cardura. He wonders if this could be attributing to his symptoms. Patient doesn't want to "stop breathing" in his sleep. I told him that I will make both Dr [name] and Dr Sallyanne Kuster aware of his concerns of getting weaker. Patient agrees with plan and will wait for a MD or RN to call him back.

## 2020-06-29 ENCOUNTER — Telehealth: Payer: Self-pay | Admitting: Cardiovascular Disease

## 2020-06-29 NOTE — Telephone Encounter (Signed)
Patient c/o Palpitations:  High priority if patient c/o lightheadedness, shortness of breath, or chest pain  How long have you had palpitations/irregular HR/ Afib? Are you having the symptoms now? Months   Are you currently experiencing lightheadedness, SOB or CP? No, but has SOB upon exertion. Has to stop two to three times going to the mailbox and back as well as when bending over SOB & lightheadedness   Do you have a history of afib (atrial fibrillation) or irregular heart rhythm? Yes  Have you checked your BP or HR? (document readings if available): Yes, fluctuates is higher in the in the morning and lower in the afternoon HR is always in the 90's between 90-105   Are you experiencing any other symptoms? Extreme fatigue, warm like he may have a fever. Does report having a little blood sugar problem.   Wanting to know if he should go to [facility] or [facility] in regards to this and if he does not go to the hospital if he can come in and get a holter monitor to wear for a few days. Reports heart feels it is going out of rhythm.

## 2020-06-29 NOTE — Telephone Encounter (Signed)
Spoke with pt regarding feeling progressively feeling worst over the last several days. Pt would like advice on which ED to go to. Explained that either one of our EDs would be good to go to. Expressed to pt that if he thinks that his symptoms seem to be more heart related that [facility] might be better to go to with access to cath lab, etc. Pt asks if he can be seen in the office, explained to pt that office is closing soon and we wouldn't be able to help him this afternoon. Pt verbalizes understanding.

## 2020-06-29 NOTE — Telephone Encounter (Signed)
Left a message for the patient to call back with the recommendations for the patient to switch from Cardura to Tamsulosin. The patient will need to call the prescribing provider for the switch if that is what is needed.    Personally, I am not sure what I can do to help him get the CPAP equipment quicker.  Cardura (doxazosin) can definitely cause orthostatic hypotension and dizziness and weakness while standing up.  Was this prescribed for urinary bladder-prostate problems with difficulty urination?  I would recommend switching to a newer agent such as tamsulosin 0.4 mg at bedtime daily.

## 2020-06-30 ENCOUNTER — Observation Stay (HOSPITAL_COMMUNITY)
Admission: EM | Admit: 2020-06-30 | Discharge: 2020-07-01 | Disposition: A | Discharge status: Home / Self Care | Payer: Medicare Other | Attending: Family Medicine | Admitting: Family Medicine

## 2020-06-30 ENCOUNTER — Encounter (HOSPITAL_COMMUNITY): Payer: Self-pay | Admitting: *Deleted

## 2020-06-30 ENCOUNTER — Emergency Department (HOSPITAL_COMMUNITY): Payer: Medicare Other

## 2020-06-30 ENCOUNTER — Other Ambulatory Visit: Payer: Self-pay

## 2020-06-30 DIAGNOSIS — R55 Syncope and collapse: Secondary | ICD-10-CM | POA: Diagnosis not present

## 2020-06-30 DIAGNOSIS — Z79899 Other long term (current) drug therapy: Secondary | ICD-10-CM | POA: Diagnosis not present

## 2020-06-30 DIAGNOSIS — I5040 Unspecified combined systolic (congestive) and diastolic (congestive) heart failure: Secondary | ICD-10-CM | POA: Diagnosis not present

## 2020-06-30 DIAGNOSIS — I959 Hypotension, unspecified: Principal | ICD-10-CM | POA: Insufficient documentation

## 2020-06-30 DIAGNOSIS — I480 Paroxysmal atrial fibrillation: Secondary | ICD-10-CM | POA: Insufficient documentation

## 2020-06-30 DIAGNOSIS — Z7901 Long term (current) use of anticoagulants: Secondary | ICD-10-CM | POA: Diagnosis not present

## 2020-06-30 DIAGNOSIS — R531 Weakness: Secondary | ICD-10-CM

## 2020-06-30 DIAGNOSIS — I2510 Atherosclerotic heart disease of native coronary artery without angina pectoris: Secondary | ICD-10-CM | POA: Insufficient documentation

## 2020-06-30 DIAGNOSIS — I491 Atrial premature depolarization: Secondary | ICD-10-CM | POA: Diagnosis not present

## 2020-06-30 DIAGNOSIS — Z20822 Contact with and (suspected) exposure to covid-19: Secondary | ICD-10-CM | POA: Insufficient documentation

## 2020-06-30 DIAGNOSIS — R42 Dizziness and giddiness: Secondary | ICD-10-CM | POA: Diagnosis not present

## 2020-06-30 DIAGNOSIS — R0602 Shortness of breath: Secondary | ICD-10-CM | POA: Diagnosis not present

## 2020-06-30 DIAGNOSIS — R231 Pallor: Secondary | ICD-10-CM | POA: Diagnosis not present

## 2020-06-30 DIAGNOSIS — I952 Hypotension due to drugs: Secondary | ICD-10-CM

## 2020-06-30 DIAGNOSIS — G4733 Obstructive sleep apnea (adult) (pediatric): Secondary | ICD-10-CM | POA: Diagnosis not present

## 2020-06-30 DIAGNOSIS — I9589 Other hypotension: Secondary | ICD-10-CM | POA: Diagnosis present

## 2020-06-30 DIAGNOSIS — I110 Hypertensive heart disease with heart failure: Secondary | ICD-10-CM | POA: Insufficient documentation

## 2020-06-30 DIAGNOSIS — G250 Essential tremor: Secondary | ICD-10-CM

## 2020-06-30 LAB — COMPREHENSIVE METABOLIC PANEL
ALT: 22 U/L (ref 0–44)
AST: 22 U/L (ref 15–41)
Albumin: 3.4 g/dL — ABNORMAL LOW (ref 3.5–5.0)
Alkaline Phosphatase: 45 U/L (ref 38–126)
Anion gap: 9 (ref 5–15)
BUN: 21 mg/dL (ref 8–23)
CO2: 28 mmol/L (ref 22–32)
Calcium: 8.8 mg/dL — ABNORMAL LOW (ref 8.9–10.3)
Chloride: 102 mmol/L (ref 98–111)
Creatinine, Ser: 1 mg/dL (ref 0.61–1.24)
GFR, Estimated: >60 mL/min (ref >60)
Glucose, Bld: 156 mg/dL — ABNORMAL HIGH (ref 70–99)
Potassium: 4.3 mmol/L (ref 3.5–5.1)
Sodium: 139 mmol/L (ref 135–145)
Total Bilirubin: 1 mg/dL (ref 0.3–1.2)
Total Protein: 5.2 g/dL — ABNORMAL LOW (ref 6.5–8.1)

## 2020-06-30 LAB — CBC WITH DIFFERENTIAL/PLATELET
Abs Immature Granulocytes: 0.02 10*3/uL (ref 0.00–0.07)
Basophils Absolute: 0 10*3/uL (ref 0.0–0.1)
Basophils Relative: 0 %
Eosinophils Absolute: 0 10*3/uL (ref 0.0–0.5)
Eosinophils Relative: 1 %
HCT: 41.8 % (ref 39.0–52.0)
Hemoglobin: 13.7 g/dL (ref 13.0–17.0)
Immature Granulocytes: 0 %
Lymphocytes Relative: 17 %
Lymphs Abs: 1 10*3/uL (ref 0.7–4.0)
MCH: 32.3 pg (ref 26.0–34.0)
MCHC: 32.8 g/dL (ref 30.0–36.0)
MCV: 98.6 fL (ref 80.0–100.0)
Monocytes Absolute: 0.4 10*3/uL (ref 0.1–1.0)
Monocytes Relative: 7 %
Neutro Abs: 4.6 10*3/uL (ref 1.7–7.7)
Neutrophils Relative %: 75 %
Platelets: 159 10*3/uL (ref 150–400)
RBC: 4.24 MIL/uL (ref 4.22–5.81)
RDW: 13.7 % (ref 11.5–15.5)
WBC: 6.1 10*3/uL (ref 4.0–10.5)
nRBC: 0 % (ref 0.0–0.2)

## 2020-06-30 LAB — URINALYSIS, ROUTINE W REFLEX MICROSCOPIC
Bilirubin Urine: NEGATIVE
Glucose, UA: 100 mg/dL — AB
Ketones, ur: NEGATIVE mg/dL
Leukocytes,Ua: NEGATIVE
Nitrite: NEGATIVE
Protein, ur: NEGATIVE mg/dL
Specific Gravity, Urine: 1.025 (ref 1.005–1.030)
pH: 6 (ref 5.0–8.0)

## 2020-06-30 LAB — URINALYSIS, MICROSCOPIC (REFLEX)

## 2020-06-30 LAB — RESP PANEL BY RT-PCR (FLU A&B, COVID) ARPGX2
Influenza A by PCR: NEGATIVE
Influenza B by PCR: NEGATIVE
SARS Coronavirus 2 by RT PCR: NEGATIVE

## 2020-06-30 LAB — PROTIME-INR
INR: 2.4 — ABNORMAL HIGH (ref 0.8–1.2)
Prothrombin Time: 26.2 seconds — ABNORMAL HIGH (ref 11.4–15.2)

## 2020-06-30 LAB — TROPONIN I (HIGH SENSITIVITY): Troponin I (High Sensitivity): 3 ng/L (ref <18)

## 2020-06-30 IMAGING — DX DG CHEST 1V PORT
1 series · 1 of 1 positions shown · non-contrast
Comparison: [DATE]

CLINICAL DATA: Shortness of breath

EXAM:
PORTABLE CHEST 1 VIEW

[chest ap]
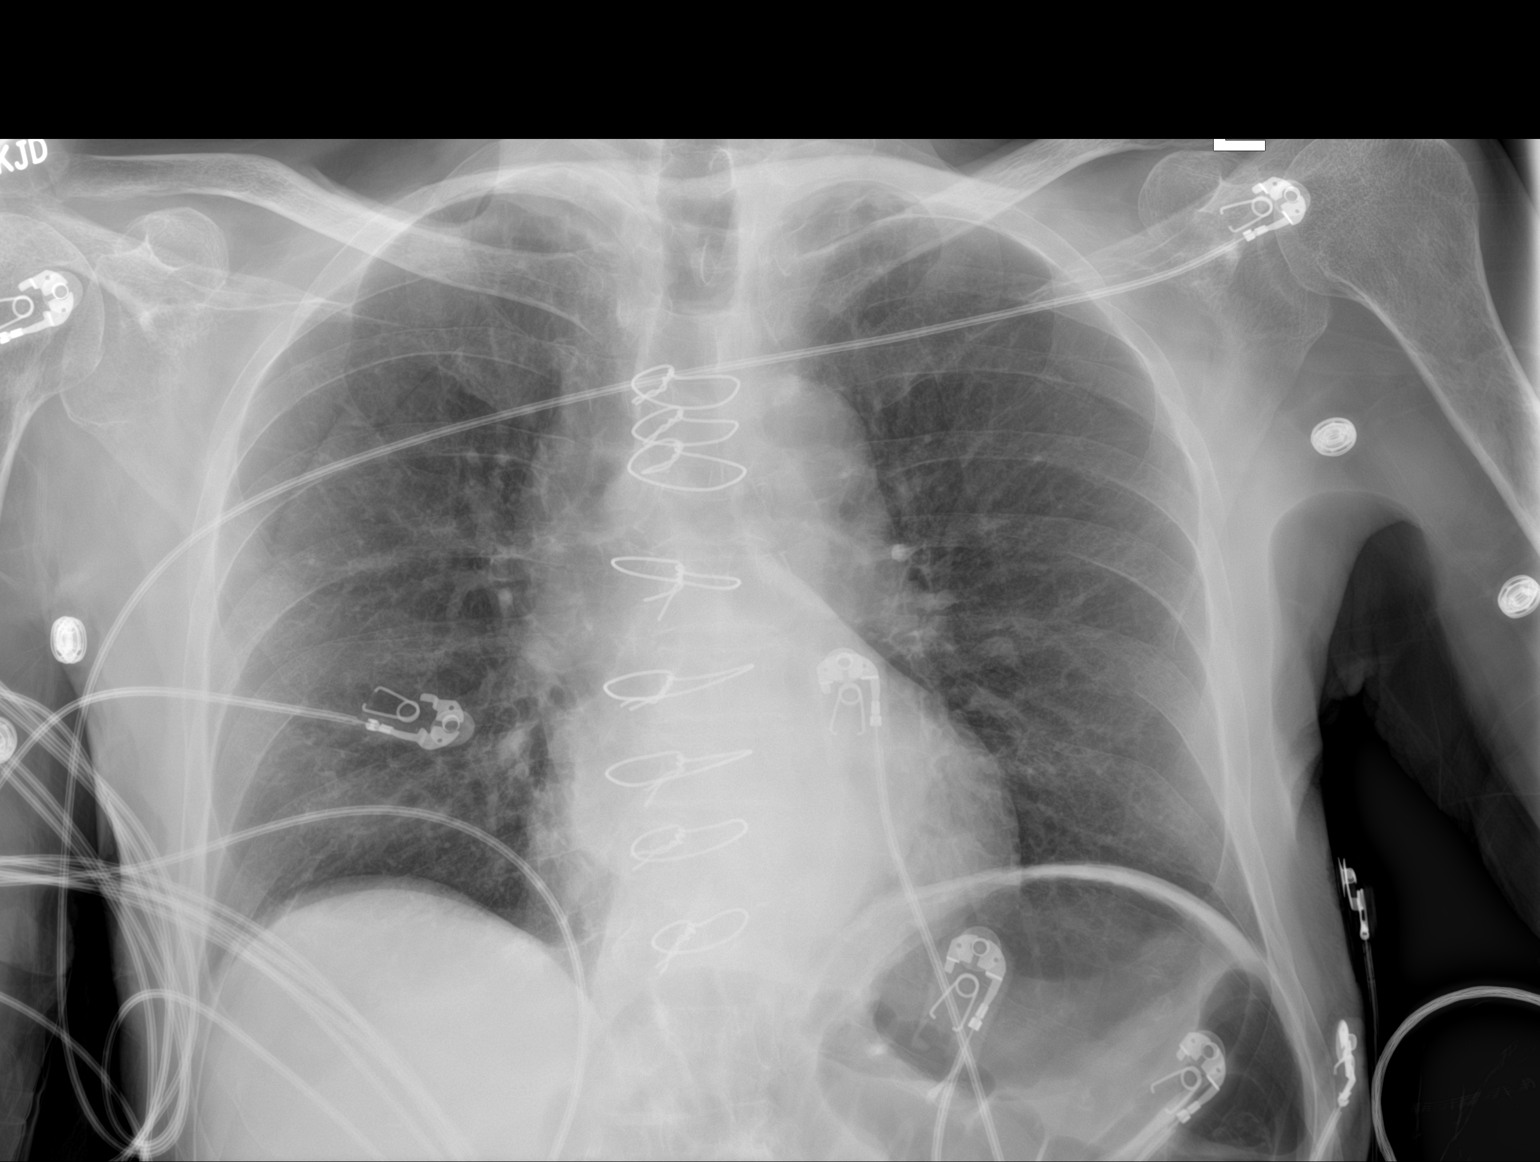

[1 of 1 positions shown; findings below may reference images not displayed]

FINDINGS: Normal heart size. Aortic tortuosity. Prior median sternotomy.
Chronic mild interstitial coarsening. There is no edema,
consolidation, effusion, or pneumothorax.
IMPRESSION: Stable from prior.  No evidence of acute disease.

## 2020-06-30 MED ORDER — ACETAMINOPHEN 325 MG PO TABS
650.0000 mg | ORAL_TABLET | Freq: Four times a day (QID) | ORAL | Status: DC | PRN
  Administered 2020-06-30: 650 mg via ORAL
  Filled 2020-06-30: qty 2

## 2020-06-30 MED ORDER — MELATONIN 3 MG PO TABS: 3.0000 mg | ORAL_TABLET | Freq: Every evening | ORAL | Status: DC | PRN

## 2020-06-30 MED ORDER — ALBUTEROL SULFATE (2.5 MG/3ML) 0.083% IN NEBU: 2.5000 mg | INHALATION_SOLUTION | Freq: Four times a day (QID) | RESPIRATORY_TRACT | Status: DC | PRN

## 2020-06-30 MED ORDER — ACETAMINOPHEN 650 MG RE SUPP: 650.0000 mg | Freq: Four times a day (QID) | RECTAL | Status: DC | PRN

## 2020-06-30 MED ORDER — SODIUM CHLORIDE 0.9 % IV SOLN: Freq: Once | INTRAVENOUS | Status: AC

## 2020-06-30 MED ORDER — WARFARIN - PHARMACIST DOSING INPATIENT: Freq: Every day | Status: DC

## 2020-06-30 MED ORDER — ENSURE ENLIVE PO LIQD
237.0000 mL | Freq: Two times a day (BID) | ORAL | Status: DC
  Administered 2020-06-30 – 2020-07-01 (×2): 237 mL via ORAL

## 2020-06-30 MED ORDER — WARFARIN SODIUM 5 MG PO TABS
5.0000 mg | ORAL_TABLET | Freq: Every day | ORAL | Status: DC
  Administered 2020-06-30: 5 mg via ORAL
  Filled 2020-06-30: qty 1

## 2020-06-30 MED ORDER — SODIUM CHLORIDE 0.9 % IV BOLUS
500.0000 mL | Freq: Once | INTRAVENOUS | Status: AC
  Administered 2020-06-30: 500 mL via INTRAVENOUS

## 2020-06-30 MED ORDER — SODIUM CHLORIDE 0.9% FLUSH
3.0000 mL | Freq: Two times a day (BID) | INTRAVENOUS | Status: DC
  Administered 2020-06-30 – 2020-07-01 (×2): 3 mL via INTRAVENOUS

## 2020-06-30 MED ORDER — ENOXAPARIN SODIUM 40 MG/0.4ML IJ SOSY
40.0000 mg | PREFILLED_SYRINGE | Freq: Every day | INTRAMUSCULAR | Status: DC
  Administered 2020-06-30: 40 mg via SUBCUTANEOUS
  Filled 2020-06-30: qty 0.4

## 2020-06-30 NOTE — ED Notes (Addendum)
Transferred to 3E bed 21 via EDT

## 2020-06-30 NOTE — ED Triage Notes (Signed)
Patient presents to ED via [facility] states he woke up and was very dizzy, and nauseated denies chest pain . States he started a new b/p medication last pm cardura . Upon ems arrival patient was diaphoretic clammy b/p was 80/50. Patient has tremors however that is not new. Upon arrival to ED patient states he feels a little better. 500cc NS per ems.

## 2020-06-30 NOTE — H&P (Addendum)
History and Physical    Austin Davis IHK:[id number] DOB: 1940-05-18 DOA: 06/30/2020  Referring MD/NP/PA: Charlann Lange, PA-C PCP: [name], MD  Patient coming from: Home via EMS  Chief Complaint: Passed out  I have personally briefly reviewed patient's old medical records in [place]   HPI: Austin Davis is a 80 y.o. 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  History is obtained from the patient who reports that he has been dealing with a chronic whole body tremor and weakness for some time now.  He had been seen by his cardiologist Dr. Sallyanne Kuster who had prescribed Cardura to see if it would help with his tremor as beta-blockers were thought to possibly worsen patient's.  Last night patient finally got the courage to take the medication, but only took half of the 4mg  dose he was prescribed.  At the next hour or 2 he had gotten up to use the bathroom and was unable to make it.  He requires breaking out in a cold sweat, but was  able to sit down in a chair nearby prior to passing out. His long-term girlfriend was present and he did not fall or sustain any trauma.  It is unclear how long patient lost consciousness.  And EMS was called.  Patient denied having any chest pain.  He makes note that he has been feeling weak and jittery for quite some time.  He had previously been seen by Dr. Jaynee Eagles of neurology and worked up and recently been found to have concern for sleep apnea for which a CPAP machine had been ordered.  Patient reported associated symptoms of weight loss over 30 pounds over the last year, fatigue, dyspnea with exertion, and insomnia.    En route with EMS patient was noted to be diaphoretic and clammy with blood pressure 80/50.  He was given 500 cc normal saline fluids with  improvement in blood pressures.  ED Course: On admission into the emergency department patient was seen to be afebrile with pulse 91-101, respirations 20-26, and blood pressures as low as 85/60 with improvement after receiving additional 500 mL bolus of IV fluids with maps maintained greater than 65.  CBC and CMP are relatively unremarkable with INR therapeutic at 2.4.  Review of Systems  Constitutional:  Positive for diaphoresis, malaise/fatigue and weight loss. Negative for fever.  HENT:  Negative for ear discharge and nosebleeds.   Eyes:  Negative for photophobia and pain.  Respiratory:  Positive for shortness of breath.   Cardiovascular:  Negative for chest pain and leg swelling.  Gastrointestinal:  Negative for diarrhea, nausea and vomiting.  Genitourinary:  Negative for dysuria.  Musculoskeletal:  Negative for falls.  Skin:  Negative for rash.  Neurological:  Positive for tremors, loss of consciousness and weakness.  Psychiatric/Behavioral:  Negative for substance abuse. The patient has insomnia.    Past Medical History:  Diagnosis Date   CHF (congestive heart failure) ([place])    Coronary artery disease    GI bleed 2019   hospitalized at [facility] for one week   Headache    since childhood   Low blood sugar    since childhood, controlled by diet   Mitral regurgitation    Mitral valve prolapse    Osteopenia 2021   Paroxysmal atrial fibrillation ([place])  Past Surgical History:  Procedure Laterality Date   COLONOSCOPY WITH PROPOFOL N/A 02/19/2017   Procedure: COLONOSCOPY WITH PROPOFOL;  Surgeon: Wilford Corner, MD;  Location: [facility] ENDOSCOPY;  Service: Endoscopy;  Laterality: N/A;   laser eye surgery for retina detachment     MITRAL VALVE REPAIR  01/2003   monitor  02/05/2006   polyp removal     TONSILLECTOMY     tooth removal     as a teenager     reports that he has never smoked. He has never used smokeless tobacco. He reports current alcohol use. He reports that he  does not use drugs.  Allergies  Allergen Reactions   Prednisone Other (See Comments)    Makes skin crawl, rapid HR   Cortisone Palpitations    Family History  Problem Relation Age of Onset   Heart disease Mother    CAD Mother    Migraines Mother    Heart failure Father    Skin cancer Father    Valvular heart disease Father        prolapsed valve   Tremor Father        "probably had a bit of tremor in his old age"   Heart failure Maternal Grandmother    Pneumonia Maternal Grandfather    Heart failure Paternal Grandmother    Stroke Paternal Grandfather    Asthma Daughter    Epilepsy Son    Parkinson's disease Neg Hx     Prior to Admission medications   Medication Sig Start Date End Date Taking? Authorizing Provider  acetaminophen (TYLENOL) 500 MG tablet Take 500 mg by mouth every 6 (six) hours as needed for mild pain or headache.   Yes [provider]  Ascorbic Acid (VITAMIN C IMMUNE HEALTH PO) Take 500 mg by mouth.   Yes [provider]  B Complex Vitamins (VITAMIN B COMPLEX PO) Take 1 tablet by mouth daily.   Yes [provider]  Cholecalciferol (D3) 50 MCG (2000 UT) TABS Take 2,000 Units by mouth daily.   Yes [provider]  doxazosin (CARDURA) 4 MG tablet Take 4 mg by mouth daily.   Yes [provider]  Garlic 782 MG TABS Take 100 mg by mouth daily.   Yes [provider]  Magnesium 250 MG TABS Take 250 mg by mouth daily.   Yes [provider]  melatonin 3 MG TABS tablet Take 3 mg by mouth at bedtime as needed (sleep).   Yes [provider]  Omega-3 Fatty Acids (FISH OIL PO) Take 1 capsule by mouth daily.   Yes [provider]  warfarin (COUMADIN) 5 MG tablet TAKE 1/2 TO 1 TABLET DAILY AS DIRECTED BY COUMADIN CLINIC. Patient taking differently: Take 5 mg by mouth daily. 03/14/20  Yes Minus Breeding, MD    Physical Exam:  Constitutional: Frail elderly male who appears in no acute distress  unable to follow commands Vitals:   06/30/20 0600 06/30/20 0615 06/30/20 0700 06/30/20 0800  BP: 92/80 98/67 109/79 110/72  Pulse: 85 94 91 (!) 101  Resp: (!) 26 (!) 26 (!) 22 (!) 25  Temp:      TempSrc:      SpO2: 98% 98% 98% 98%  Weight:      Height:       Eyes: PERRL, lids and conjunctivae normal ENMT: Mucous membranes are moist. Posterior pharynx clear of any exudate or lesions.  Neck: normal, supple, no masses, no thyromegaly Respiratory: Decreased aeration,  but no  significant wheezes or rhonchi appreciated. Cardiovascular: Regular rate and rhythm, no murmurs / rubs / gallops. No extremity edema. 2+ pedal pulses. No carotid bruits.  Abdomen: no tenderness, no masses palpated. No hepatosplenomegaly. Bowel sounds positive.  Musculoskeletal: no clubbing / cyanosis. No joint deformity upper and lower extremities. Good ROM, no contractures. Normal muscle tone.  Skin: no rashes, lesions, ulcers. No induration Neurologic: CN 2-12 grossly intact.  Essential tremor present. Psychiatric: Normal judgment and insight. Alert and oriented x 3. Normal mood.     Labs on Admission: I have personally reviewed following labs and imaging studies  CBC: Recent Labs  Lab 06/30/20 0521  WBC 6.1  NEUTROABS 4.6  HGB 13.7  HCT 41.8  MCV 98.6  PLT 259   Basic Metabolic Panel: Recent Labs  Lab 06/30/20 0521  NA 139  K 4.3  CL 102  CO2 28  GLUCOSE 156*  BUN 21  CREATININE 1.00  CALCIUM 8.8*   GFR: Estimated Creatinine Clearance: 48 mL/min (by C-G formula based on SCr of 1 mg/dL). Liver Function Tests: Recent Labs  Lab 06/30/20 0521  AST 22  ALT 22  ALKPHOS 45  BILITOT 1.0  PROT 5.2*  ALBUMIN 3.4*   No results for input(s): LIPASE, AMYLASE in the last 168 hours. No results for input(s): AMMONIA in the last 168 hours. Coagulation Profile: Recent Labs  Lab 06/30/20 0536  INR 2.4*   Cardiac Enzymes: No results for input(s): CKTOTAL, CKMB, CKMBINDEX, TROPONINI in the last  168 hours. BNP (last 3 results) No results for input(s): PROBNP in the last 8760 hours. HbA1C: No results for input(s): HGBA1C in the last 72 hours. CBG: No results for input(s): GLUCAP in the last 168 hours. Lipid Profile: No results for input(s): CHOL, HDL, LDLCALC, TRIG, CHOLHDL, LDLDIRECT in the last 72 hours. Thyroid Function Tests: No results for input(s): TSH, T4TOTAL, FREET4, T3FREE, THYROIDAB in the last 72 hours. Anemia Panel: No results for input(s): VITAMINB12, FOLATE, FERRITIN, TIBC, IRON, RETICCTPCT in the last 72 hours. Urine analysis:    Component Value Date/Time   COLORURINE YELLOW 09/25/2006 1500   APPEARANCEUR CLEAR 09/25/2006 1500   LABSPEC 1.015 09/25/2006 1500   PHURINE 7.5 09/25/2006 1500   GLUCOSEU NEGATIVE 09/25/2006 1500   HGBUR NEGATIVE 09/25/2006 1500   BILIRUBINUR NEGATIVE 09/25/2006 1500   KETONESUR NEGATIVE 09/25/2006 1500   PROTEINUR NEGATIVE 09/25/2006 1500   UROBILINOGEN 0.2 09/25/2006 1500   NITRITE NEGATIVE 09/25/2006 1500   LEUKOCYTESUR  09/25/2006 1500    NEGATIVE MICROSCOPIC NOT DONE ON URINES WITH NEGATIVE PROTEIN, BLOOD, LEUKOCYTES, NITRITE, OR GLUCOSE <1000 mg/dL.   Sepsis Labs: Recent Results (from the past 240 hour(s))  Resp Panel by RT-PCR (Flu A&B, Covid) Nasopharyngeal Swab     Status: None   Collection Time: 06/30/20  6:15 AM   Specimen: Nasopharyngeal Swab; Nasopharyngeal(NP) swabs in vial transport medium  Result Value Ref Range Status   SARS Coronavirus 2 by RT PCR NEGATIVE NEGATIVE Final    Comment: (NOTE) SARS-CoV-2 target nucleic acids are NOT DETECTED.  The SARS-CoV-2 RNA is generally detectable in upper respiratory specimens during the acute phase of infection. The lowest concentration of SARS-CoV-2 viral copies this assay can detect is 138 copies/mL. A negative result does not preclude SARS-Cov-2 infection and should not be used as the sole basis for treatment or other patient management decisions. A negative result  may occur with  improper specimen collection/handling, submission of specimen other than nasopharyngeal swab, presence of viral mutation(s) within the areas  targeted by this assay, and inadequate number of viral copies(<138 copies/mL). A negative result must be combined with clinical observations, patient history, and epidemiological information. The expected result is Negative.  Fact Sheet for Patients:  EntrepreneurPulse.com.au  Fact Sheet for Healthcare Providers:  IncredibleEmployment.be  This test is no t yet approved or cleared by the [place] FDA and  has been authorized for detection and/or diagnosis of SARS-CoV-2 by FDA under an Emergency Use Authorization (EUA). This EUA will remain  in effect (meaning this test can be used) for the duration of the COVID-19 declaration under Section 564(b)(1) of the Act, 21 U.S.C.section 360bbb-3(b)(1), unless the authorization is terminated  or revoked sooner.       Influenza A by PCR NEGATIVE NEGATIVE Final   Influenza B by PCR NEGATIVE NEGATIVE Final    Comment: (NOTE) The Xpert Xpress SARS-CoV-2/FLU/RSV plus assay is intended as an aid in the diagnosis of influenza from Nasopharyngeal swab specimens and should not be used as a sole basis for treatment. Nasal washings and aspirates are unacceptable for Xpert Xpress SARS-CoV-2/FLU/RSV testing.  Fact Sheet for Patients: EntrepreneurPulse.com.au  Fact Sheet for Healthcare Providers: IncredibleEmployment.be  This test is not yet approved or cleared by the [place] FDA and has been authorized for detection and/or diagnosis of SARS-CoV-2 by FDA under an Emergency Use Authorization (EUA). This EUA will remain in effect (meaning this test can be used) for the duration of the COVID-19 declaration under Section 564(b)(1) of the Act, 21 U.S.C. section 360bbb-3(b)(1), unless the authorization is terminated  or revoked.  Performed at [facility], [place] [address], [place], [place] [address]      Radiological Exams on Admission: DG Chest Portable 1 View  Result Date: 06/30/2020 CLINICAL DATA:  Shortness of breath EXAM: PORTABLE CHEST 1 VIEW COMPARISON:  04/30/2020 FINDINGS: Normal heart size. Aortic tortuosity. Prior median sternotomy. Chronic mild interstitial coarsening. There is no edema, consolidation, effusion, or pneumothorax. IMPRESSION: Stable from prior.  No evidence of acute disease. Electronically Signed   By: Monte Fantasia M.D.   On: 06/30/2020 06:23    EKG: Independently reviewed.  Sinus rhythm at 80 bpm with QTC 482  Assessment/Plan SyncopeHypotension: Acute.  Patient had recently started Cardura 2 mg last night.  Patient woke up dizzy and diaphoretic found to have blood pressures as low as 80/50.  He had been bolused 500 mL of IV fluids with temporary improvement before reoccurring.  After second bolus of 500 mL of IV fluids blood pressures have been stable and he was placed on a rate of 75 mL/h.  Suspect symptoms secondary to medication. -Admit to a telemetry bed -Add on troponin -Check orthostatics -Discontinue Cardura -Gentle IV fluids as needed for hypotension  Essential tremor: Patient had been worked up previously by Dr.  Jaynee Eagles with negative MRI.  TSH 2.16 on 3/18.  Complains of worsening tremor for which he was tried on Cardura.  Beta-blockers not a good choice due to fatigue. -Recommend outpatient follow-up with neurology  Paroxysmal atrial fibrillation on chronic anticoagulation: Patient appears to be in sinus rhythm and has not been documented in atrial fibrillation since his surgical maze procedure back in 2005 by Dr. Darcey Nora.  INR therapeutic at 2.4. -Continue Coumadin per pharmacy  Generalized weakness weight loss: Patient reports losing 30 or more pounds over the last 1 year. Thyroid study was normal and glucose appears to be within normal limits. -PT  to eval and treat -Will likely need further work-up in the  outpatient setting  OSA: Patient recently been prescribed CPAP machine but had not received it yet -CPAP nightly  DVT prophylaxis: Coumadin Code Status: Full Family Communication: Ended to contact patient's family but no one available. Disposition Plan: Hopefully discharge home in a.m. Consults called: None Admission status: Observation,  Norval Morton MD [facility]   If 7PM-7AM, please contact night-coverage   06/30/2020, 9:03 AM

## 2020-06-30 NOTE — Plan of Care (Signed)

## 2020-06-30 NOTE — Evaluation (Signed)
Physical Therapy Evaluation Patient Details Name: Austin Davis MRN: [id number] DOB: 05/25/1940 Today's Date: 06/30/2020   History of Present Illness  The pt is a 80 yo male presenting 6/11 with c/o waking up dizzy, nauseated, and with chest pain. Upon arrival, pt hypotensive (80/50). PMH includes: CHF, HTN, CAD, PAF, GI bleed, and mitral valve annuloplasty.   Clinical Impression  Pt in bed upon arrival of PT, agreeable to evaluation at this time. Prior to admission the pt was independent with mobility and ADLs, but reports increasing use of SPC and limited activity due to progressive weakness and fatigue with activity. The pt lives in a home with his significant other that has 1 step to enter. The pt now presents with limitations in functional mobility, power, strength, dynamic stability, and endurance due to chronically reduced activity and will continue to benefit from skilled PT to address these deficits. The pt was able to demo good bed mobility without assist, but requires minG for safety and up to minA to steady with OOB mobility such as sit-stand transfers and gait. The pt had no overt LOB and SpO2 remained in 90s on RA, but the pt reported increased SOB with mobility and had multiple minor episodes of lateral drifting or steps needing minA to steady. Will continue to benefit from skilled PT acutely to address deficits in strength and endurance, and HHPT to continue progress.   5X Sit-to-Stand: 29 sec (> 12.6 sec indicates increased risk of falls for individuals aged 81-79, > 15 sec indicates increased risk of recurrent falls)  The patient ambulates with a gait speed of 0.57 m/s using RW and with minG for safety. A gait speed less than 0.61m/s indicates increased risk of falls and dependence in ADLs.      Follow Up Recommendations Home health PT;Supervision for mobility/OOB    Equipment Recommendations  Rolling walker with 5" wheels;3in1 (PT)    Recommendations for Other Services        Precautions / Restrictions Precautions Precautions: Fall Restrictions Weight Bearing Restrictions: No      Mobility  Bed Mobility Overal bed mobility: Modified Independent             General bed mobility comments: use of bed rail, increased time and effort    Transfers Overall transfer level: Needs assistance Equipment used: Rolling walker (2 wheeled) Transfers: Sit to/from Stand Sit to Stand: Min guard         General transfer comment: minG for safety, cues for hand placement. 5xsts in 29 sec  Ambulation/Gait Ambulation/Gait assistance: Min guard Gait Distance (Feet): 125 Feet Assistive device: Rolling walker (2 wheeled);1 person hand held assist Gait Pattern/deviations: Step-through pattern;Decreased stride length;Drifts right/left;Narrow base of support Gait velocity: 0.57 m/s Gait velocity interpretation: 1.31 - 2.62 ft/sec, indicative of limited community ambulator General Gait Details: pt with small strides, improved stability with RW, but able to ambulate without UE support for ~35 ft without overt LOB. 2/4 DOE, SpO2 steady on RA. HR to110s     Balance Overall balance assessment: Needs assistance Sitting-balance support: Single extremity supported;Feet supported Sitting balance-Leahy Scale: Fair   Postural control: Posterior lean Standing balance support: Bilateral upper extremity supported;No upper extremity supported Standing balance-Leahy Scale: Fair Standing balance comment: benefits from BUE support, no overt LOB with or without UE support                             Pertinent Vitals/Pain Pain Assessment: No/denies  pain    Home Living Family/patient expects to be discharged to:: Private residence Living Arrangements: Spouse/significant other Available Help at Discharge: Friend(s);Available 24 hours/day (not able to physically assist much) Type of Home: House Home Access: Stairs to enter Entrance Stairs-Rails:   (collumn) Entrance Stairs-Number of Steps: 1 Home Layout: One level Home Equipment: Cane - single point      Prior Function Level of Independence: Independent         Comments: pt still driving, using cane intermittently, has been feeling progressively weaker unable to walk community distances     Hand Dominance   Dominant Hand: Right    Extremity/Trunk Assessment   Upper Extremity Assessment Upper Extremity Assessment: Defer to OT evaluation    Lower Extremity Assessment Lower Extremity Assessment: Generalized weakness    Cervical / Trunk Assessment Cervical / Trunk Assessment: Kyphotic  Communication   Communication: No difficulties  Cognition Arousal/Alertness: Awake/alert Behavior During Therapy: WFL for tasks assessed/performed Overall Cognitive Status: Within Functional Limits for tasks assessed                                        General Comments General comments (skin integrity, edema, etc.): VSS on RA, pt reports 2/4 DOE    Exercises     Assessment/Plan    PT Assessment Patient needs continued PT services  PT Problem List Decreased strength;Decreased activity tolerance;Decreased balance;Decreased mobility;Cardiopulmonary status limiting activity       PT Treatment Interventions DME instruction;Gait training;Stair training;Functional mobility training;Therapeutic activities;Therapeutic exercise;Balance training;Patient/family education    PT Goals (Current goals can be found in the Care Plan section)  Acute Rehab PT Goals Patient Stated Goal: improve endurance, learn how to safely exercise PT Goal Formulation: With patient Time For Goal Achievement: 07/14/20 Potential to Achieve Goals: Good    Frequency Min 3X/week    AM-PAC PT "6 Clicks" Mobility  Outcome Measure Help needed turning from your back to your side while in a flat bed without using bedrails?: None Help needed moving from lying on your back to sitting on the  side of a flat bed without using bedrails?: None Help needed moving to and from a bed to a chair (including a wheelchair)?: A Little Help needed standing up from a chair using your arms (e.g., wheelchair or bedside chair)?: A Little Help needed to walk in hospital room?: A Little Help needed climbing 3-5 steps with a railing? : A Little 6 Click Score: 20    End of Session Equipment Utilized During Treatment: Gait belt Activity Tolerance: Patient tolerated treatment well;Patient limited by fatigue Patient left: in chair;with chair alarm set;with call bell/phone within reach Nurse Communication: Mobility status PT Visit Diagnosis: Other abnormalities of gait and mobility (R26.89)    Time: 0177-9390 PT Time Calculation (min) (ACUTE ONLY): 35 min   Charges:   PT Evaluation $PT Eval Low Complexity: 1 Low PT Treatments $Gait Training: 8-22 mins        Karma Ganja, PT, DPT   Acute Rehabilitation Department Pager #: [phone]  Otho Bellows 06/30/2020, 4:43 PM

## 2020-06-30 NOTE — Progress Notes (Signed)
ANTICOAGULATION CONSULT NOTE - Initial Consult  Pharmacy Consult for warfarin Indication: atrial fibrillation  Allergies  Allergen Reactions   Prednisone Other (See Comments)    Makes skin crawl, rapid HR   Cortisone Palpitations    Patient Measurements: Height: 5\' 6"  (167.6 cm) Weight: 56.7 kg (125 lb) IBW/kg (Calculated) : 63.8  Vital Signs: Temp: 96.8 F (36 C) (06/11 0534) Temp Source: Temporal (06/11 0534) BP: 98/71 (06/11 0900) Pulse Rate: 104 (06/11 0900)  Labs: Recent Labs    06/30/20 0521 06/30/20 0536  HGB 13.7  --   HCT 41.8  --   PLT 159  --   LABPROT  --  26.2*  INR  --  2.4*  CREATININE 1.00  --     Estimated Creatinine Clearance: 48 mL/min (by C-G formula based on SCr of 1 mg/dL).   Medical History: Past Medical History:  Diagnosis Date   CHF (congestive heart failure) ([place])    Coronary artery disease    GI bleed 2019   hospitalized at [facility] for one week   Headache    since childhood   Low blood sugar    since childhood, controlled by diet   Mitral regurgitation    Mitral valve prolapse    Osteopenia 2021   Paroxysmal atrial fibrillation (H[facility])     Medications:  (Not in a hospital admission)   Assessment: 81 YOM with a h/o Afib on warfarin at home presented with diaphoresis and low BP. Pharmacy consulted to resume home warfarin therapy.   H/H and Plt wnl. INR therapeutic at 2.4 today. Last dose of warfarin was yesterday.   Goal of Therapy:  INR 2-3 Monitor platelets by anticoagulation protocol: Yes   Plan:  -Resume warfarin 5 mg daily  -Monitor daily PT/INR -Monitor for s/s of bleeding   Albertina Parr, PharmD., BCPS, BCCCP Clinical Pharmacist Please refer to [facility] for unit-specific pharmacist

## 2020-06-30 NOTE — Progress Notes (Signed)
   06/30/20 1142 06/30/20 1154 06/30/20 1156  Vitals  Temp 97.8 F (36.6 C)  --   --   Temp Source Oral  --   --   BP 112/72  --   --   MAP (mmHg) 85  --   --   BP Location Right Arm  --   --   BP Method Automatic  --   --   Patient Position (if appropriate) Lying  --   --   Pulse Rate 89  --   --   Pulse Rate Source Dinamap  --   --   Resp 20  --   --   MEWS COLOR  MEWS Score Color Green  --   --   Orthostatic Sitting  BP- Sitting  --  98/73  --   Pulse- Sitting  --  99  --   Orthostatic Standing at 0 minutes  BP- Standing at 0 minutes  --   --  111/82  Pulse- Standing at 0 minutes  --   --  104  Orthostatic Standing at 3 minutes  BP- Standing at 3 minutes  --   --   --   Pulse- Standing at 3 minutes  --   --   --   Oxygen Therapy  SpO2 100 % 98 % 97 %  O2 Device Room Air Room Air Room Air    06/30/20 1159  Vitals  Temp  --   Temp Source  --   BP  --   MAP (mmHg)  --   BP Location  --   BP Method  --   Patient Position (if appropriate)  --   Pulse Rate  --   Pulse Rate Source  --   Resp  --   MEWS COLOR  MEWS Score Color  --   Orthostatic Sitting  BP- Sitting  --   Pulse- Sitting  --   Orthostatic Standing at 0 minutes  BP- Standing at 0 minutes  --   Pulse- Standing at 0 minutes  --   Orthostatic Standing at 3 minutes  BP- Standing at 3 minutes 117/87  Pulse- Standing at 3 minutes 105  Oxygen Therapy  SpO2 99 %  O2 Device Room Air

## 2020-06-30 NOTE — Progress Notes (Signed)
Spoke with pt about CPAP order. Pt states he had a sleep study and they told him the CPAP is ordered but he hasn't received it yet. Pt wishes to hold off for now since he has not worn before.

## 2020-06-30 NOTE — ED Provider Notes (Signed)
[facility] EMERGENCY DEPARTMENT Provider Note   CSN: [id number] Arrival date & time: 06/30/20  0451     History Chief Complaint  Patient presents with   Dizziness    Austin Davis is a 80 y.o. male.  Patient with history of CHF, HTN, CAD, PAF, coagulopathy, valvular disease presents after waking up this morning feeling dizzy, weak, diaphoretic. Per EMS, BP on their arrival was 80/50. A 500 cc bolus was given and he reports feeling improved but not completely back to normal. He states he has been prescribed Cardura 4 mg and he took 1/2 pill before bedtime last night. This was his first dose of this medication. No nausea, chest pain, cough. He reports SOB that has been progressive over the last several weeks.   The history is provided by the patient and the EMS personnel. No language interpreter was used.  Dizziness Associated symptoms: nausea, palpitations, shortness of breath and weakness (Generalized)   Associated symptoms: no chest pain, no diarrhea and no vomiting       Past Medical History:  Diagnosis Date   CHF (congestive heart failure) (HCC)    Coronary artery disease    GI bleed 2019   hospitalized at [facility] for one week   Headache    since childhood   Low blood sugar    since childhood, controlled by diet   Mitral regurgitation    Mitral valve prolapse    Osteopenia 2021   Paroxysmal atrial fibrillation [facility])     Patient Active Problem List   Diagnosis Date Noted   GIB (gastrointestinal bleeding) 02/17/2017   Rectal bleeding 02/16/2017   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   Essential tremor 02/19/2015   Status post mitral valve annuloplasty and MAZE 2005 12/12/2014   HLD (hyperlipidemia) 05/05/2013   DOE (dyspnea on exertion) 04/22/2013   Fatigue 04/22/2013   Atrial fibrillation ([place]) 05/08/2012   Long term (current) use of anticoagulants 05/08/2012    Past Surgical History:  Procedure Laterality Date    COLONOSCOPY WITH PROPOFOL N/A 02/19/2017   Procedure: COLONOSCOPY WITH PROPOFOL;  Surgeon: Wilford Corner, MD;  Location: [facility] ENDOSCOPY;  Service: Endoscopy;  Laterality: N/A;   laser eye surgery for retina detachment     MITRAL VALVE REPAIR  01/2003   monitor  02/05/2006   polyp removal     TONSILLECTOMY     tooth removal     as a teenager       Family History  Problem Relation Age of Onset   Heart disease Mother    CAD Mother    Migraines Mother    Heart failure Father    Skin cancer Father    Valvular heart disease Father        prolapsed valve   Tremor Father        "probably had a bit of tremor in his old age"   Heart failure Maternal Grandmother    Pneumonia Maternal Grandfather    Heart failure Paternal Grandmother    Stroke Paternal Grandfather    Asthma Daughter    Epilepsy Son    Parkinson's disease Neg Hx     Social History   Tobacco Use   Smoking status: Never   Smokeless tobacco: Never   Tobacco comments:    only tried a few cigarettes when he was younger  Substance Use Topics   Alcohol use: Yes    Comment: "probably a 6 pack of beer a year"  Drug use: No    Home Medications Prior to Admission medications   Medication Sig Start Date End Date Taking? Authorizing Provider  acetaminophen (TYLENOL) 500 MG tablet Take 500 mg by mouth every 6 (six) hours as needed for mild pain or headache.   Yes [provider]  Ascorbic Acid (VITAMIN C IMMUNE HEALTH PO) Take 500 mg by mouth.   Yes [provider]  B Complex Vitamins (VITAMIN B COMPLEX PO) Take 1 tablet by mouth daily.   Yes [provider]  Cholecalciferol (D3) 50 MCG (2000 UT) TABS Take 2,000 Units by mouth daily.   Yes [provider]  doxazosin (CARDURA) 4 MG tablet Take 4 mg by mouth daily.   Yes [provider]  Garlic 161 MG TABS Take 100 mg by mouth daily.   Yes [provider]  Magnesium 250 MG TABS Take 250 mg by mouth daily.   Yes [provider]  melatonin 3 MG TABS tablet Take 3 mg by mouth at bedtime as needed (sleep).   Yes [provider]  Omega-3 Fatty Acids (FISH OIL PO) Take 1 capsule by mouth daily.   Yes [provider]  warfarin (COUMADIN) 5 MG tablet TAKE 1/2 TO 1 TABLET DAILY AS DIRECTED BY COUMADIN CLINIC. Patient taking differently: Take 5 mg by mouth daily. 03/14/20  Yes Minus Breeding, MD    Allergies    Prednisone and Cortisone  Review of Systems   Review of Systems  Constitutional:  Positive for diaphoresis. Negative for fever.  HENT: Negative.    Eyes:  Negative for visual disturbance.  Respiratory:  Positive for shortness of breath. Negative for cough.   Cardiovascular:  Positive for palpitations. Negative for chest pain.  Gastrointestinal:  Positive for nausea. Negative for abdominal pain, diarrhea and vomiting.  Genitourinary:  Negative for dysuria.  Musculoskeletal:  Negative for myalgias.  Skin:  Negative for color change and rash.  Neurological:  Positive for weakness (Generalized) and light-headedness. Negative for syncope.   Physical Exam Updated Vital Signs BP 98/67   Pulse 94   Temp (!) 96.8 F (36 C) (Temporal)   Resp (!) 26   Ht 5\' 6"  (1.676 m)   Wt 56.7 kg   SpO2 98%   BMI 20.18 kg/m   Physical Exam Vitals and nursing note reviewed.  Constitutional:      General: He is not in acute distress.    Appearance: Normal appearance. He is not diaphoretic.     Comments: Frail, elderly patient  HENT:     Head: Normocephalic and atraumatic.     Mouth/Throat:     Mouth: Mucous membranes are dry.  Cardiovascular:     Rate and Rhythm: Normal rate and regular rhythm.  Pulmonary:     Effort: Pulmonary effort is normal.     Breath sounds: No wheezing, rhonchi or rales.  Chest:     Chest wall: No tenderness.  Abdominal:     General: There is no distension.     Palpations: Abdomen is soft.     Tenderness: There is no abdominal tenderness.  Musculoskeletal:         General: Normal range of motion.     Cervical back: Normal range of motion and neck supple.     Right lower leg: No edema.     Left lower leg: No edema.  Skin:    General: Skin is warm and dry.  Neurological:     Mental Status: He is alert  and oriented to person, place, and time.     Sensory: No sensory deficit.     Comments: No lateralizing weakness. CN's 3-12 grossly intact. Generalized tremor (chronic)    ED Results / Procedures / Treatments   Labs (all labs ordered are listed, but only abnormal results are displayed) Labs Reviewed  COMPREHENSIVE METABOLIC PANEL - Abnormal; Notable for the following components:      Result Value   Glucose, Bld 156 (*)    Calcium 8.8 (*)    Total Protein 5.2 (*)    Albumin 3.4 (*)    All other components within normal limits  PROTIME-INR - Abnormal; Notable for the following components:   Prothrombin Time 26.2 (*)    INR 2.4 (*)    All other components within normal limits  RESP PANEL BY RT-PCR (FLU A&B, COVID) ARPGX2  CBC WITH DIFFERENTIAL/PLATELET  URINALYSIS, ROUTINE W REFLEX MICROSCOPIC   Results for orders placed or performed during the hospital encounter of 06/30/20  CBC with Differential  Result Value Ref Range   WBC 6.1 4.0 - 10.5 K/uL   RBC 4.24 4.22 - 5.81 MIL/uL   Hemoglobin 13.7 13.0 - 17.0 g/dL   HCT 41.8 39.0 - 52.0 %   MCV 98.6 80.0 - 100.0 fL   MCH 32.3 26.0 - 34.0 pg   MCHC 32.8 30.0 - 36.0 g/dL   RDW 13.7 11.5 - 15.5 %   Platelets 159 150 - 400 K/uL   nRBC 0.0 0.0 - 0.2 %   Neutrophils Relative % 75 %   Neutro Abs 4.6 1.7 - 7.7 K/uL   Lymphocytes Relative 17 %   Lymphs Abs 1.0 0.7 - 4.0 K/uL   Monocytes Relative 7 %   Monocytes Absolute 0.4 0.1 - 1.0 K/uL   Eosinophils Relative 1 %   Eosinophils Absolute 0.0 0.0 - 0.5 K/uL   Basophils Relative 0 %   Basophils Absolute 0.0 0.0 - 0.1 K/uL   Immature Granulocytes 0 %   Abs Immature Granulocytes 0.02 0.00 - 0.07 K/uL  Comprehensive metabolic panel   Result Value Ref Range   Sodium 139 135 - 145 mmol/L   Potassium 4.3 3.5 - 5.1 mmol/L   Chloride 102 98 - 111 mmol/L   CO2 28 22 - 32 mmol/L   Glucose, Bld 156 (H) 70 - 99 mg/dL   BUN 21 8 - 23 mg/dL   Creatinine, Ser 1.00 0.61 - 1.24 mg/dL   Calcium 8.8 (L) 8.9 - 10.3 mg/dL   Total Protein 5.2 (L) 6.5 - 8.1 g/dL   Albumin 3.4 (L) 3.5 - 5.0 g/dL   AST 22 15 - 41 U/L   ALT 22 0 - 44 U/L   Alkaline Phosphatase 45 38 - 126 U/L   Total Bilirubin 1.0 0.3 - 1.2 mg/dL   GFR, Estimated >60 >60 mL/min   Anion gap 9 5 - 15  Protime-INR  Result Value Ref Range   Prothrombin Time 26.2 (H) 11.4 - 15.2 seconds   INR 2.4 (H) 0.8 - 1.2    EKG EKG Interpretation  Date/Time:  Saturday June 30 2020 05:41:58 EDT Ventricular Rate:  80 PR Interval:  159 QRS Duration: 102 QT Interval:  417 QTC Calculation: 482 R Axis:   35 Text Interpretation: Sinus rhythm Low voltage, extremity leads Borderline prolonged QT interval Similar to 2019 tracing Confirmed by Nanda Quinton [id number]) on 06/30/2020 6:48:26 AM  Radiology DG Chest Portable 1 View  Result Date: 06/30/2020 CLINICAL DATA:  Shortness  of breath EXAM: PORTABLE CHEST 1 VIEW COMPARISON:  04/30/2020 FINDINGS: Normal heart size. Aortic tortuosity. Prior median sternotomy. Chronic mild interstitial coarsening. There is no edema, consolidation, effusion, or pneumothorax. IMPRESSION: Stable from prior.  No evidence of acute disease. Electronically Signed   By: Monte Fantasia M.D.   On: 06/30/2020 06:23    Procedures Procedures   Medications Ordered in ED Medications  sodium chloride 0.9 % bolus 500 mL (0 mLs Intravenous Stopped 06/30/20 0545)  0.9 %  sodium chloride infusion ( Intravenous New Bag/Given 06/30/20 0545)    ED Course  I have reviewed the triage vital signs and the nursing notes.  Pertinent labs & imaging results that were available during my care of the patient were reviewed by me and considered in my medical decision making (see  chart for details).    MDM Rules/Calculators/A&P                          Patient to ED for symptoms of weakness, diaphoresis, lightheadedness that woke him from sleep. Found hypotensive by EMS at 80/50. Started Cardura last night for the first time. He states he has felt somewhat weak and SOB prior to taking the medication but symptoms progressive for months.   Patient's pressure 100/66 on arrival, quickly decreases to 85/60, where patient reports recurrent symptoms. Cardura 1/2 life noted to be biphasic with terminal elimination of 22 hours.   He has received 1 liter bolus and has had recurrent hypotension. He will need admission for blood pressure support until stable.   Hospitalist paged for admission.   Final Clinical Impression(s) / ED Diagnoses Final diagnoses:  None   Hypotension Adverse medication reaction  Rx / DC Orders ED Discharge Orders     None        Charlann Lange, PA-C 06/30/20 1115    LongWonda Olds, MD 06/30/20 2317

## 2020-07-01 DIAGNOSIS — I952 Hypotension due to drugs: Secondary | ICD-10-CM

## 2020-07-01 DIAGNOSIS — I959 Hypotension, unspecified: Secondary | ICD-10-CM | POA: Diagnosis not present

## 2020-07-01 LAB — CBC
HCT: 41.4 % (ref 39.0–52.0)
Hemoglobin: 13.4 g/dL (ref 13.0–17.0)
MCH: 31.4 pg (ref 26.0–34.0)
MCHC: 32.4 g/dL (ref 30.0–36.0)
MCV: 97 fL (ref 80.0–100.0)
Platelets: 166 10*3/uL (ref 150–400)
RBC: 4.27 MIL/uL (ref 4.22–5.81)
RDW: 14.1 % (ref 11.5–15.5)
WBC: 5.7 10*3/uL (ref 4.0–10.5)
nRBC: 0 % (ref 0.0–0.2)

## 2020-07-01 LAB — BASIC METABOLIC PANEL
Anion gap: 5 (ref 5–15)
BUN: 18 mg/dL (ref 8–23)
CO2: 34 mmol/L — ABNORMAL HIGH (ref 22–32)
Calcium: 9 mg/dL (ref 8.9–10.3)
Chloride: 101 mmol/L (ref 98–111)
Creatinine, Ser: 0.86 mg/dL (ref 0.61–1.24)
GFR, Estimated: >60 mL/min (ref >60)
Glucose, Bld: 95 mg/dL (ref 70–99)
Potassium: 4 mmol/L (ref 3.5–5.1)
Sodium: 140 mmol/L (ref 135–145)

## 2020-07-01 LAB — PROTIME-INR
INR: 2.7 — ABNORMAL HIGH (ref 0.8–1.2)
Prothrombin Time: 28.8 seconds — ABNORMAL HIGH (ref 11.4–15.2)

## 2020-07-01 MED ORDER — WARFARIN SODIUM 5 MG PO TABS: 5.0000 mg | ORAL_TABLET | Freq: Every day | ORAL | Status: DC

## 2020-07-01 MED ORDER — ALPRAZOLAM 0.5 MG PO TABS
0.5000 mg | ORAL_TABLET | Freq: Once | ORAL | Status: AC
  Administered 2020-07-01: 0.5 mg via ORAL
  Filled 2020-07-01: qty 1

## 2020-07-01 NOTE — Plan of Care (Signed)
  Problem: Education: Goal: Knowledge of General Education information will improve Description: Including pain rating scale, medication(s)/side effects and non-pharmacologic comfort measures Outcome: Adequate for Discharge   Problem: Health Behavior/Discharge Planning: Goal: Ability to manage health-related needs will improve Outcome: Adequate for Discharge   Problem: Clinical Measurements: Goal: Ability to maintain clinical measurements within normal limits will improve Outcome: Adequate for Discharge Goal: Will remain free from infection Outcome: Adequate for Discharge Goal: Diagnostic test results will improve Outcome: Adequate for Discharge Goal: Respiratory complications will improve Outcome: Adequate for Discharge Goal: Cardiovascular complication will be avoided Outcome: Adequate for Discharge   Problem: Activity: Goal: Risk for activity intolerance will decrease Outcome: Adequate for Discharge   Problem: Nutrition: Goal: Adequate nutrition will be maintained Outcome: Adequate for Discharge   Problem: Coping: Goal: Level of anxiety will decrease Outcome: Adequate for Discharge   Problem: Elimination: Goal: Will not experience complications related to bowel motility Outcome: Adequate for Discharge Goal: Will not experience complications related to urinary retention Outcome: Adequate for Discharge   Problem: Pain Managment: Goal: General experience of comfort will improve Outcome: Adequate for Discharge   Problem: Safety: Goal: Ability to remain free from injury will improve Outcome: Adequate for Discharge   Problem: Skin Integrity: Goal: Risk for impaired skin integrity will decrease Outcome: Adequate for Discharge   Problem: Education: Goal: Knowledge of condition and prescribed therapy will improve Outcome: Adequate for Discharge   Problem: Cardiac: Goal: Will achieve and/or maintain adequate cardiac output Outcome: Adequate for Discharge   Problem:  Physical Regulation: Goal: Complications related to the disease process, condition or treatment will be avoided or minimized Outcome: Adequate for Discharge

## 2020-07-01 NOTE — Discharge Summary (Addendum)
Physician Discharge Summary  Austin Davis FYB:[id number] DOB: 09/18/1940 DOA: 06/30/2020  PCP: [name], MD  Admit date: 06/30/2020 Discharge date: 07/01/2020  Time spent: 50 minutes  Recommendations for Outpatient Follow-up:  Follow-up PCP in 1 week Patient was given Xanax 0.5 mg x 1 in the hospital, with improvement in tremors and anxiety.  I think it would be worthwhile a trial of SSRI for this patient's anxiety and tremors.  Can follow-up PCP to start SSRI as outpatient.   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  Patient says he took Cardura to see if it helped with his tremors and he just took half tablet of 4 mg dose which was prescribed.  At the next hour or 2 when he got up to use bathroom he was unable to make it.  He broke into cold sweat and was able to sit down in chair prior to passing out.  There was no history of fall or trauma.  In the ED blood pressure was low as 85/60 with improvement after additional 500 cc bolus normal saline was given.  INR is therapeutic at 2.4.  Hospital Course:   Syncope-likely orthostatic hypotension brought on by Cardura.  Syncope has resolved.  Orthostatic vital signs checked in the hospital are unremarkable.  Will discontinue Cardura.  Troponin was 3, EKG showed normal sinus rhythm.  Anxiety/tremor-patient in the past has not tolerated beta-blockers.  He was prescribed Cardura but that caused hypotension and syncope.  A trial of Xanax 0.5 mg p.o. x1 was given in the  hospital, and patient tremor and anxiety improved.  I have recommended that patient should follow-up with PCP and discuss starting SSRI as outpatient.  Orthostatic hypotension-secondary to Cardura.  Resolved.  Will discontinue Cardura.  Paroxysmal atrial fibrillation-patient is in normal sinus rhythm.  Has not been in A. fib since surgical maze procedure back in 2005 by Dr. Darcey Nora.  INR is therapeutic at 2.4.  Continue Coumadin 5 mg daily.  Generalized weakness-PT evaluation done, home health PT recommended with rolling walker with 5 inch wheels, 3 and 1.   Procedures:   Consultations:   Discharge Exam: Vitals:   07/01/20 1243 07/01/20 1245  BP: 139/86 (!) 140/99  Pulse: (!) 110 (!) 113  Resp: 18 20  Temp:    SpO2: 95% 95%    General: Appears in no acute distress Cardiovascular: S1-S2, regular Respiratory: Clear to auscultation bilaterally  Discharge Instructions   Discharge Instructions     Diet - low sodium heart healthy   Complete by: As directed    Increase activity slowly   Complete by: As directed       Allergies as of 07/01/2020       Reactions   Prednisone Other (See Comments)   Makes skin crawl, rapid HR   Cortisone Palpitations        Medication List     STOP taking these medications    doxazosin 4 MG tablet Commonly known as: CARDURA       TAKE these medications  acetaminophen 500 MG tablet Commonly known as: TYLENOL Take 500 mg by mouth every 6 (six) hours as needed for mild pain or headache.   D3 50 MCG (2000 UT) Tabs Generic drug: Cholecalciferol Take 2,000 Units by mouth daily.   FISH OIL PO Take 1 capsule by mouth daily.   Garlic 347 MG Tabs Take 100 mg by mouth daily.   Magnesium 250 MG Tabs Take 250 mg by mouth daily.   melatonin 3 MG Tabs tablet Take 3 mg by mouth at bedtime as needed (sleep).   VITAMIN B COMPLEX PO Take 1 tablet by mouth daily.   VITAMIN C IMMUNE HEALTH PO Take 500 mg by mouth.   warfarin  5 MG tablet Commonly known as: COUMADIN Take as directed. If you are unsure how to take this medication, talk to your nurse or doctor. Original instructions: Take 1 tablet (5 mg total) by mouth daily. What changed: See the new instructions.       Allergies  Allergen Reactions   Prednisone Other (See Comments)    Makes skin crawl, rapid HR   Cortisone Palpitations      The results of significant diagnostics from this hospitalization (including imaging, microbiology, ancillary and laboratory) are listed below for reference.    Significant Diagnostic Studies: DG Chest Portable 1 View  Result Date: 06/30/2020 CLINICAL DATA:  Shortness of breath EXAM: PORTABLE CHEST 1 VIEW COMPARISON:  04/30/2020 FINDINGS: Normal heart size. Aortic tortuosity. Prior median sternotomy. Chronic mild interstitial coarsening. There is no edema, consolidation, effusion, or pneumothorax. IMPRESSION: Stable from prior.  No evidence of acute disease. Electronically Signed   By: Monte Fantasia M.D.   On: 06/30/2020 06:23    Microbiology: Recent Results (from the past 240 hour(s))  Resp Panel by RT-PCR (Flu A&B, Covid) Nasopharyngeal Swab     Status: None   Collection Time: 06/30/20  6:15 AM   Specimen: Nasopharyngeal Swab; Nasopharyngeal(NP) swabs in vial transport medium  Result Value Ref Range Status   SARS Coronavirus 2 by RT PCR NEGATIVE NEGATIVE Final    Comment: (NOTE) SARS-CoV-2 target nucleic acids are NOT DETECTED.  The SARS-CoV-2 RNA is generally detectable in upper respiratory specimens during the acute phase of infection. The lowest concentration of SARS-CoV-2 viral copies this assay can detect is 138 copies/mL. A negative result does not preclude SARS-Cov-2 infection and should not be used as the sole basis for treatment or other patient management decisions. A negative result may occur with  improper specimen collection/handling, submission of specimen other than nasopharyngeal swab,  presence of viral mutation(s) within the areas targeted by this assay, and inadequate number of viral copies(<138 copies/mL). A negative result must be combined with clinical observations, patient history, and epidemiological information. The expected result is Negative.  Fact Sheet for Patients:  EntrepreneurPulse.com.au  Fact Sheet for Healthcare Providers:  IncredibleEmployment.be  This test is no t yet approved or cleared by the [place] FDA and  has been authorized for detection and/or diagnosis of SARS-CoV-2 by FDA under an Emergency Use Authorization (EUA). This EUA will remain  in effect (meaning this test can be used) for the duration of the COVID-19 declaration under Section 564(b)(1) of the Act, 21 U.S.C.section 360bbb-3(b)(1), unless the authorization is terminated  or revoked sooner.       Influenza A by PCR NEGATIVE NEGATIVE Final   Influenza B by PCR NEGATIVE NEGATIVE Final    Comment: (NOTE) The Xpert Xpress SARS-CoV-2/FLU/RSV plus assay is intended as an aid in  the diagnosis of influenza from Nasopharyngeal swab specimens and should not be used as a sole basis for treatment. Nasal washings and aspirates are unacceptable for Xpert Xpress SARS-CoV-2/FLU/RSV testing.  Fact Sheet for Patients: EntrepreneurPulse.com.au  Fact Sheet for Healthcare Providers: IncredibleEmployment.be  This test is not yet approved or cleared by the [place] FDA and has been authorized for detection and/or diagnosis of SARS-CoV-2 by FDA under an Emergency Use Authorization (EUA). This EUA will remain in effect (meaning this test can be used) for the duration of the COVID-19 declaration under Section 564(b)(1) of the Act, 21 U.S.C. section 360bbb-3(b)(1), unless the authorization is terminated or revoked.  Performed at [facility], [place] [address], [place], [place] [address]      Labs: Basic  Metabolic Panel: Recent Labs  Lab 06/30/20 0521 07/01/20 0248  NA 139 140  K 4.3 4.0  CL 102 101  CO2 28 34*  GLUCOSE 156* 95  BUN 21 18  CREATININE 1.00 0.86  CALCIUM 8.8* 9.0   Liver Function Tests: Recent Labs  Lab 06/30/20 0521  AST 22  ALT 22  ALKPHOS 45  BILITOT 1.0  PROT 5.2*  ALBUMIN 3.4*   No results for input(s): LIPASE, AMYLASE in the last 168 hours. No results for input(s): AMMONIA in the last 168 hours. CBC: Recent Labs  Lab 06/30/20 0521 07/01/20 0248  WBC 6.1 5.7  NEUTROABS 4.6  --   HGB 13.7 13.4  HCT 41.8 41.4  MCV 98.6 97.0  PLT 159 166   Cardiac Enzymes: No results for input(s): CKTOTAL, CKMB, CKMBINDEX, TROPONINI in the last 168 hours. BNP: BNP (last 3 results) No results for input(s): BNP in the last 8760 hours.  ProBNP (last 3 results) No results for input(s): PROBNP in the last 8760 hours.  CBG: No results for input(s): GLUCAP in the last 168 hours.     Signed:  Oswald Hillock MD.  [facility] 07/01/2020, 12:59 PM

## 2020-07-01 NOTE — TOC Transition Note (Signed)
Transition of Care (TOC) - CM/SW Discharge Note Marvetta Gibbons RN, BSN Transitions of Care Unit 4E- RN Case Manager See Treatment Team for direct phone #  Weekend cross coverage 3E  Patient Details  Name: BRAYLYNN GHAN MRN: [id number] Date of Birth: Jun 04, 1940  Transition of Care [facility]) CM/SW Contact:  Dawayne Patricia, RN Phone Number: 07/01/2020, 3:01 PM   Clinical Narrative:    Pt stable for transition home, orders placed for [facility] and DME needs. CM spoke with pt at bedside- discussed DME and HH recommendations. Per pt he lives at home with "wife"- states he uses cane, and at this time is declining DME. Pt is agreeable to [facility] services- choice offered Per CMS guidelines from medicare.gov website with star ratings (copy placed in shadow chart) , per pt he would like an agency with "high" star ratings, has chosen either [employer] or [place].   Address confirmed with pt in epic as well as phone # and PCP.   Call made to [facility]- no answer, call made to [facility] and spoke with Angie regarding HHPT referral- referral has been accepted.   Pt voiced that he has not been able to find someone to give him a ride home, explained to pt that we could assist him home with [facility] should he not be able to find a ride- pt to let bedside RN know if he needs a ride home and unit will assist him with [facility] [employer].    Final next level of care: [place] Barriers to Discharge: No Barriers Identified   Patient Goals and CMS Choice Patient states their goals for this hospitalization and ongoing recovery are:: return home CMS Medicare.gov Compare Post Acute Care list provided to:: Patient Choice offered to / list presented to : Patient  Discharge Placement                 Home with [facility]      Discharge Plan and Services   Discharge Planning Services: CM Consult Post Acute Care Choice: Durable Medical Equipment, Home Health          DME Arranged: 3-N-1,  Patient refused services, Walker rolling DME Agency: NA       HH Arranged: PT HH Agency: [place] Date [place]: 07/01/20 Time [place]: 1501 Representative spoke with at [place]: [place] ([place]) Interventions     Readmission Risk Interventions No flowsheet data found.

## 2020-07-01 NOTE — Progress Notes (Signed)
Patient states that he is hoping to find answers to symptoms that have progressively been getting worse over the course of a year. These problems include: weakness, fatigue, tremors in extremities, SHOB, weight loss, and decreased appetite. 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

## 2020-07-01 NOTE — Progress Notes (Signed)
ANTICOAGULATION CONSULT NOTE - Follow-Up Consult  Pharmacy Consult for warfarin Indication: atrial fibrillation  Allergies  Allergen Reactions   Prednisone Other (See Comments)    Makes skin crawl, rapid HR   Cortisone Palpitations    Patient Measurements: Height: 5\' 6"  (167.6 cm) Weight: 56.5 kg (124 lb 9 oz) IBW/kg (Calculated) : 63.8  Vital Signs: Temp: 97.9 F (36.6 C) (06/12 0743) Temp Source: Oral (06/12 0451) BP: 157/99 (06/12 0743) Pulse Rate: 82 (06/12 0743)  Labs: Recent Labs    06/30/20 0521 06/30/20 0536 06/30/20 1148 07/01/20 0248  HGB 13.7  --   --  13.4  HCT 41.8  --   --  41.4  PLT 159  --   --  166  LABPROT  --  26.2*  --  28.8*  INR  --  2.4*  --  2.7*  CREATININE 1.00  --   --  0.86  TROPONINIHS  --   --  3  --      Estimated Creatinine Clearance: 55.7 mL/min (by C-G formula based on SCr of 0.86 mg/dL).   Assessment: 41 YOM with a h/o Afib on warfarin at home presented with diaphoresis and low BP. Home warfarin dose 5 mg daily. Pharmacy consulted to resume home warfarin therapy.   INR continues to be therapeutic at 2.7 today. H/H and Plt wnl.   Goal of Therapy:  INR 2-3 Monitor platelets by anticoagulation protocol: Yes   Plan:  -Continue warfarin 5 mg daily  -Monitor daily PT/INR -Monitor for s/s of bleeding   Romilda Garret, PharmD PGY1 Acute Care Pharmacy Resident 07/01/2020 7:48 AM  Please check [email or web address] for unit specific pharmacy phone numbers.

## 2020-07-01 NOTE — Plan of Care (Signed)
?  Problem: Education: ?Goal: Knowledge of General Education information will improve ?Description: Including pain rating scale, medication(s)/side effects and non-pharmacologic comfort measures ?Outcome: Progressing ?  ?Problem: Health Behavior/Discharge Planning: ?Goal: Ability to manage health-related needs will improve ?Outcome: Progressing ?  ?Problem: Coping: ?Goal: Level of anxiety will decrease ?Outcome: Progressing ?  ?

## 2020-07-01 NOTE — Plan of Care (Signed)
  Problem: Education: Goal: Knowledge of General Education information will improve Description: Including pain rating scale, medication(s)/side effects and non-pharmacologic comfort measures 07/01/2020 0159 by Tristan Schroeder, RN Outcome: Progressing 07/01/2020 0158 by Tristan Schroeder, RN Outcome: Progressing

## 2020-07-02 ENCOUNTER — Other Ambulatory Visit (HOSPITAL_COMMUNITY): Payer: Self-pay | Admitting: Internal Medicine

## 2020-07-02 ENCOUNTER — Other Ambulatory Visit: Payer: Self-pay | Admitting: Internal Medicine

## 2020-07-02 DIAGNOSIS — D6859 Other primary thrombophilia: Secondary | ICD-10-CM | POA: Diagnosis not present

## 2020-07-02 DIAGNOSIS — R634 Abnormal weight loss: Secondary | ICD-10-CM | POA: Diagnosis not present

## 2020-07-02 DIAGNOSIS — D3500 Benign neoplasm of unspecified adrenal gland: Secondary | ICD-10-CM

## 2020-07-02 DIAGNOSIS — R002 Palpitations: Secondary | ICD-10-CM | POA: Diagnosis not present

## 2020-07-02 DIAGNOSIS — I482 Chronic atrial fibrillation, unspecified: Secondary | ICD-10-CM | POA: Diagnosis not present

## 2020-07-02 DIAGNOSIS — R251 Tremor, unspecified: Secondary | ICD-10-CM | POA: Diagnosis not present

## 2020-07-02 DIAGNOSIS — R899 Unspecified abnormal finding in specimens from other organs, systems and tissues: Secondary | ICD-10-CM | POA: Diagnosis not present

## 2020-07-02 DIAGNOSIS — D447 Neoplasm of uncertain behavior of aortic body and other paraganglia: Secondary | ICD-10-CM | POA: Diagnosis not present

## 2020-07-02 DIAGNOSIS — Z6822 Body mass index (BMI) 22.0-22.9, adult: Secondary | ICD-10-CM | POA: Diagnosis not present

## 2020-07-03 DIAGNOSIS — I480 Paroxysmal atrial fibrillation: Secondary | ICD-10-CM | POA: Diagnosis not present

## 2020-07-03 DIAGNOSIS — R5381 Other malaise: Secondary | ICD-10-CM | POA: Diagnosis not present

## 2020-07-03 DIAGNOSIS — R450 Nervousness: Secondary | ICD-10-CM | POA: Diagnosis not present

## 2020-07-04 NOTE — Progress Notes (Signed)
Virtual Visit via Telephone Note   This visit type was conducted due to national recommendations for restrictions regarding the COVID-19 Pandemic (e.g. social distancing) in an effort to limit this patient's exposure and mitigate transmission in our community.  Due to his co-morbid illnesses, this patient is at least at moderate risk for complications without adequate follow up.  This format is felt to be most appropriate for this patient at this time.  The patient did not have access to video technology/had technical difficulties with video requiring transitioning to audio format only (telephone).  All issues noted in this document were discussed and addressed.  No physical exam could be performed with this format.  Please refer to the patient's chart for his  consent to telehealth for [facility].  Evaluation Performed:  Follow-up visit  This visit type was conducted due to national recommendations for restrictions regarding the COVID-19 Pandemic (e.g. social distancing).  This format is felt to be most appropriate for this patient at this time.  All issues noted in this document were discussed and addressed.  No physical exam was performed (except for noted visual exam findings with Video Visits).  Please refer to the patient's chart (MyChart message for video visits and phone note for telephone visits) for the patient's consent to telehealth for [facility]  Date:  07/06/2020   ID:  [name], DOB 12-05-1940, MRN [id number]  Patient Location:  [address] [place] [address]   Provider location:     [address] Office [phone] Fax [phone]   PCP:  [name], MD  Cardiologist:  Sanda Klein, MD  Electrophysiologist:  None   Chief Complaint: Follow-up for syncopal event and fatigue  History of Present Illness:    Austin Davis is a 80 y.o. male who presents via audio/video  conferencing for a telehealth visit today.  Patient verified DOB and address.  He has a PMH of atrial fibrillation, hypotension, rectal bleeding, GI bleed, essential tremor, DOE, fatigue, HLD, and is status post mitral valve annuloplasty/Maze procedure 2005.  He was seen by Dr. Sallyanne Kuster on 04/06/2020.  During that time he continued to complain of fatigue.  He reported he felt very weak and tired all the time.  He felt like he was fighting to stay awake.  He had lost weight.  He continued to sleep poorly at night.  He reported he was taking naps in the afternoon and was still able to fall asleep during the day.  He is not obese.  His thyroid checked and showed normal thyroid function.  His hemoglobin once 16.3.  He underwent home sleep study in October and had CPAP titration in January.  Study showed that he was positive for OSA.  He had not received his machine yet due to it being on backorder.  His blood pressure was slightly elevated.  He reported at home his blood pressure was typically in the 130s over 80s.  He denied falls, injuries and bleeding issues related to anticoagulation.  He was following with neurology for his worsening essential tremor.  He was not taking any medications for this.  He was admitted to the hospital on 06/30/2020 with hypotension and discharged on 07/01/2020.  Is connected with via phone today.  He contacted the office and reported that he did not feel well enough to drive this morning.  His visit was transitioned to virtual.  He continues to feel weak  and fatigued.  We reviewed his recent hospitalization labs, and EKG.  He has not yet received his CPAP device.  We reviewed his blood pressure since he has been home which are quite labile.  He has blood pressures in the 937T systolic, 024 systolic, and this morning his blood pressure was 155/104.  He reports that he is working with neurologist and will have studies done next week on his adrenal glands.  He has had no further  episodes of syncope but does report that he is not eating or drinking well and was lost several pounds.  He does note periods of missed heartbeats on a weekly basis.  He reports that his episodes lasted for around 5 minutes.  We reviewed his paroxysmal atrial fibrillation.  He states that since the COVID-19 pandemic he has been much less active.  He continues to try to walk in his neighborhood but is taking more frequent rest periods.  He reports that he wakes up fatigued.  I have asked him to increase his hydration, increase 6-calorie intake, and increase his physical activity.  I will order a cardiac event monitor, check in with Dr. [name] to see if we can expedite his CPAP, and have him follow-up in 6 to 8 weeks.  Today he denies chest pain, shortness of breath, lower extremity edema,  melena, hematuria, hemoptysis, diaphoresis, weakness, presyncope, syncope, orthopnea, and PND.  The patient does not symptoms concerning for COVID-19 infection (fever, chills, cough, or new SHORTNESS OF BREATH).    Prior CV studies:   The following studies were reviewed today:  Echocardiogram 10/14/2019 IMPRESSIONS     1. Left ventricular ejection fraction, by estimation, is 45 to 50%. The  left ventricle has mildly decreased function. The left ventricle  demonstrates regional wall motion abnormalities. Hypokinesis of the  inferoseptal wall, the basal to mid inferior  wall, and the basal to mid inferolateral wall. There is mild left  ventricular hypertrophy. Left ventricular diastolic parameters are  consistent with Grade I diastolic dysfunction (impaired relaxation).   2. Right ventricular systolic function is mildly reduced. The right  ventricular size is normal. Tricuspid regurgitation signal is inadequate  for assessing PA pressure.   3. Left atrial size was mildly dilated.   4. Status post mitral valve repair. No more than mild mitral stenosis,  mean gradient 4 mmHg. No significant regurgitation.   5.  The aortic valve is tricuspid. Aortic valve regurgitation is mild. No  aortic stenosis is present.   6. Aortic dilatation noted. There is mild dilatation of the aortic root,  measuring 40 mm.   7. The inferior vena cava is normal in size with greater than 50%  respiratory variability, suggesting right atrial pressure of 3 mmHg.   Comparison(s): 04/26/13 EF 50-55%. MV 86mmHg mean PG, 56mmHg peak PG.   Past Medical History:  Diagnosis Date   CHF (congestive heart failure) ([place])    Coronary artery disease    GI bleed 2019   hospitalized at [facility] for one week   Headache    since childhood   Low blood sugar    since childhood, controlled by diet   Mitral regurgitation    Mitral valve prolapse    Osteopenia 2021   Paroxysmal atrial fibrillation [facility])    Past Surgical History:  Procedure Laterality Date   COLONOSCOPY WITH PROPOFOL N/A 02/19/2017   Procedure: COLONOSCOPY WITH PROPOFOL;  Surgeon: Wilford Corner, MD;  Location: [facility] ENDOSCOPY;  Service: Endoscopy;  Laterality: N/A;  laser eye surgery for retina detachment     MITRAL VALVE REPAIR  01/2003   monitor  02/05/2006   polyp removal     TONSILLECTOMY     tooth removal     as a teenager     No outpatient medications have been marked as taking for the 07/06/20 encounter (Telemedicine) with Deberah Pelton, NP.     Allergies:   Prednisone and Cortisone   Social History   Tobacco Use   Smoking status: Never   Smokeless tobacco: Never   Tobacco comments:    only tried a few cigarettes when he was younger  Substance Use Topics   Alcohol use: Yes    Comment: "probably a 6 pack of beer a year"   Drug use: No     Family Hx: The patient's family history includes Asthma in his daughter; CAD in his mother; Epilepsy in his son; Heart disease in his mother; Heart failure in his father, maternal grandmother, and paternal grandmother; Migraines in his mother; Pneumonia in his maternal grandfather; Skin cancer in his father;  Stroke in his paternal grandfather; Tremor in his father; Valvular heart disease in his father. There is no history of Parkinson's disease.  ROS:   Please see the history of present illness.     All other systems reviewed and are negative.   Labs/Other Tests and Data Reviewed:    Recent Labs: 04/06/2020: TSH 2.160 06/30/2020: ALT 22 07/01/2020: BUN 18; Creatinine, Ser 0.86; Hemoglobin 13.4; Platelets 166; Potassium 4.0; Sodium 140   Recent Lipid Panel Lab Results  Component Value Date/Time   CHOL 180 01/30/2015 11:15 AM   TRIG 75 01/30/2015 11:15 AM   HDL 55 01/30/2015 11:15 AM   CHOLHDL 3.3 01/30/2015 11:15 AM   LDLCALC 110 01/30/2015 11:15 AM    Wt Readings from Last 3 Encounters:  07/06/20 118 lb (53.5 kg)  07/01/20 124 lb 9 oz (56.5 kg)  04/06/20 139 lb 3.2 oz (63.1 kg)     Exam:    Vital Signs:  BP (!) 155/104   Pulse (!) 105   Wt 118 lb (53.5 kg)   BMI 19.05 kg/m    Well nourished, well developed male in no  acute distress.   ASSESSMENT & PLAN:    1.  Syncope-admitted to the hospital 06/30/2020 and discharged on 07/01/2020.  Diagnosed with hypotension as a result of doxazosin.  Denies further episodes of syncope. Order cardiac event monitor Increase p.o. hydration Increase caloric intake Lower extremity support stockings  Paroxysmal atrial fibrillation-heart rate today 105 bpm.  Recently admitted to the hospital for syncopal episode.  Received IV fluids, doxazosin discontinued.  Does notice occasional episodes of missed beats that last around 5 minutes every couple weeks.  Underwent maze procedure in 2005 by Dr. Prescott Gum.  Reports compliance with warfarin, denies bleeding issues. Continue warfarin Heart healthy low-sodium diet-salty 6 given Increase physical activity as tolerated Order cardiac event monitor  [facility]-reports CPAP is on back order.  Fatigue in the morning. Follows with Dr. [name]  Status post mitral valve repair-echocardiogram 9/21 showed no  significant changes from previous exam.  No increased DOE or activity intolerance. No further diagnostics planned at this time.  Peripheral arterial disease- Previous ABI showed incompressible calcified vessels in lower extremities. Continue omega-3 fatty acids Heart healthy low-sodium diet-salty 6 given Increase physical activity as tolerated  Essential tremor-previously discussed reluctance to prescribe beta-blockade therapy due to fatigue.  Upcoming MRI and adrenal work-up. Follows with neurology  Disposition: Follow-up with Dr. Sallyanne Kuster in 6 to 8 weeks  COVID-19 Education: The signs and symptoms of COVID-19 were discussed with the patient and how to seek care for testing (follow up with PCP or arrange E-visit).  The importance of social distancing was discussed today.  Patient Risk:   After full review of this patients clinical status, I feel that they are at least moderate risk at this time.  Time:   Today, I have spent 18 minutes with the patient with telehealth technology discussing medications, diet, exercise, heart rate, sleep apnea, and blood pressure.  I spent greater than 20 minutes reviewing his past medical history, cardiac medications and cardiac test.   Medication Adjustments/Labs and Tests Ordered: Current medicines are reviewed at length with the patient today.  Concerns regarding medicines are outlined above.   Tests Ordered: No orders of the defined types were placed in this encounter.  Medication Changes: No orders of the defined types were placed in this encounter.   Disposition:  in 8 week(s)  Signed, Jossie Ng. Cheris Tweten NP-C    08/24/2018 11:58 AM    [address] Office [phone] Fax [phone]

## 2020-07-06 ENCOUNTER — Encounter: Payer: Self-pay | Admitting: General Practice

## 2020-07-06 ENCOUNTER — Telehealth (INDEPENDENT_AMBULATORY_CARE_PROVIDER_SITE_OTHER): Payer: Medicare Other | Admitting: General Practice

## 2020-07-06 ENCOUNTER — Ambulatory Visit (INDEPENDENT_AMBULATORY_CARE_PROVIDER_SITE_OTHER): Payer: Medicare Other

## 2020-07-06 VITALS — BP 155/104 | HR 105 | Wt 118.0 lb

## 2020-07-06 DIAGNOSIS — I059 Rheumatic mitral valve disease, unspecified: Secondary | ICD-10-CM

## 2020-07-06 DIAGNOSIS — G250 Essential tremor: Secondary | ICD-10-CM | POA: Diagnosis not present

## 2020-07-06 DIAGNOSIS — I739 Peripheral vascular disease, unspecified: Secondary | ICD-10-CM

## 2020-07-06 DIAGNOSIS — G4733 Obstructive sleep apnea (adult) (pediatric): Secondary | ICD-10-CM

## 2020-07-06 DIAGNOSIS — R55 Syncope and collapse: Secondary | ICD-10-CM

## 2020-07-06 DIAGNOSIS — I480 Paroxysmal atrial fibrillation: Secondary | ICD-10-CM

## 2020-07-06 NOTE — Progress Notes (Unsigned)
Enrolled patient for a 14 day Zio XT Monitor to be mailed to patients home   Dr Sallyanne Kuster to read

## 2020-07-06 NOTE — Patient Instructions (Addendum)
Medication Instructions:  The current medical regimen is effective;  continue present plan and medications as directed. Please refer to the Current Medication list given to you today.  *If you need a refill on your cardiac medications before your next appointment, please call your pharmacy*  Lab Work: NONE  Special Instructions WEAR ZIO MONITOR [place] IN YOUR DIET  PLEASE SLIGHTLY INCREASE SALT IN YOUR DIET  PLEASE INCREASE PHYSICAL ACTIVITY AS TOLERATED  Please try to avoid these triggers: Do not use any products that have nicotine or tobacco in them. These include cigarettes, e-cigarettes, and chewing tobacco. If you need help quitting, ask your doctor. Eat heart-healthy foods. Talk with your doctor about the right eating plan for you. Exercise regularly as told by your doctor. Stay hydrated Do not drink alcohol, Caffeine or chocolate. Lose weight if you are overweight. Do not use drugs, including cannabis   Follow-Up: Your next appointment:  6 week(s) In Person with Sanda Klein, MD OR IF UNAVAILABLE JESSE CLEAVER, FNP-C 08-24-2020 @915AM   At [facility], you and your health needs are our priority.  As part of our continuing mission to provide you with exceptional heart care, we have created designated Provider Care Teams.  These Care Teams include your primary Cardiologist (physician) and Advanced Practice Providers (APPs -  Physician Assistants and Nurse Practitioners) who all work together to provide you with the care you need, when you need it.  We recommend signing up for the patient portal called "MyChart".  Sign up information is provided on this After Visit Summary.  MyChart is used to connect with patients for Virtual Visits (Telemedicine).  Patients are able to view lab/test results, encounter notes, upcoming appointments, etc.  Non-urgent messages can be sent to your provider as well.   To learn more about what  you can do with MyChart, go to [email or web address].      ZIO XT- Long Term Monitor Instructions  Your physician has requested you wear a ZIO patch monitor for 14 days.  This is a single patch monitor. [employer] supplies one patch monitor per enrollment. Additional stickers are not available. Please do not apply patch if you will be having a Nuclear Stress Test, Echocardiogram, Cardiac CT, MRI, or Chest Xray during the period you would be wearing the monitor. The patch cannot be worn during these tests. You cannot remove and re-apply the ZIO XT patch monitor.  Your ZIO patch monitor will be mailed 3 day USPS to your address on file. It may take 3-5 days to receive your monitor after you have been enrolled.  Once you have received your monitor, please review the enclosed instructions. Your monitor has already been registered assigning a specific monitor serial # to you.  Billing and Patient Assistance Program Information  We have supplied [employer] with any of your insurance information on file for billing purposes. [employer] offers a sliding scale Patient Assistance Program for patients that do not have insurance, or whose insurance does not completely cover the cost of the ZIO monitor.  You must apply for the Patient Assistance Program to qualify for this discounted rate.  To apply, please call [employer] at [phone], select option 4, select option 2, ask to apply for Patient Assistance Program. Theodore Demark will ask your household income, and how many people are in your household. They will quote your out-of-pocket cost based on that information.  [employer] will also be able to set up a 44-month, interest-free payment plan  if needed.  Applying the monitor   Shave hair from upper left chest.  Hold abrader disc by orange tab. Rub abrader in 40 strokes over the upper left chest as indicated in your monitor instructions.  Clean area with 4 enclosed alcohol pads. Let dry.  Apply patch as indicated in  monitor instructions. Patch will be placed under collarbone on left side of chest with arrow pointing upward.  Rub patch adhesive wings for 2 minutes. Remove white label marked "1". Remove the white label marked "2". Rub patch adhesive wings for 2 additional minutes.  While looking in a mirror, press and release button in center of patch. A small green light will flash 3-4 times. This will be your only indicator that the monitor has been turned on.  Do not shower for the first 24 hours. You may shower after the first 24 hours.  Press the button if you feel a symptom. You will hear a small click. Record Date, Time and Symptom in the Patient Logbook.  When you are ready to remove the patch, follow instructions on the last 2 pages of Patient Logbook. Stick patch monitor onto the last page of Patient Logbook.  Place Patient Logbook in the blue and white box. Use locking tab on box and tape box closed securely. The blue and white box has prepaid postage on it. Please place it in the mailbox as soon as possible. Your physician should have your test results approximately 7 days after the monitor has been mailed back to [facility].  Call [facility] at [phone] if you have questions regarding your ZIO XT patch monitor. Call them immediately if you see an orange light blinking on your monitor.  If your monitor falls off in less than 4 days, contact our Monitor department at [phone].  If your monitor becomes loose or falls off after 4 days call [employer] at [phone] for suggestions on securing your monitor

## 2020-07-12 ENCOUNTER — Other Ambulatory Visit: Payer: Self-pay

## 2020-07-12 ENCOUNTER — Encounter (HOSPITAL_COMMUNITY)
Admission: RE | Admit: 2020-07-12 | Discharge: 2020-07-12 | Disposition: A | Discharge status: Home / Self Care | Payer: Medicare Other | Source: Ambulatory Visit | Attending: Internal Medicine | Admitting: Internal Medicine

## 2020-07-12 DIAGNOSIS — Z951 Presence of aortocoronary bypass graft: Secondary | ICD-10-CM | POA: Diagnosis not present

## 2020-07-12 DIAGNOSIS — D3500 Benign neoplasm of unspecified adrenal gland: Secondary | ICD-10-CM | POA: Diagnosis not present

## 2020-07-12 DIAGNOSIS — D447 Neoplasm of uncertain behavior of aortic body and other paraganglia: Secondary | ICD-10-CM | POA: Insufficient documentation

## 2020-07-12 DIAGNOSIS — Z0389 Encounter for observation for other suspected diseases and conditions ruled out: Secondary | ICD-10-CM | POA: Diagnosis not present

## 2020-07-12 IMAGING — CT NM PET SKULL BASE TO THIGH
7 series · 25 of 25 positions shown · non-contrast
Comparison: CT [DATE]

CLINICAL DATA: Concern for paraganglioma/pheochromocytoma.

EXAM:
NUCLEAR MEDICINE PET SKULL BASE TO THIGH
TECHNIQUE: 4.0 mCi MADONNA 54 DOTATATE was injected intravenously. Full-ring PET
imaging was performed from the skull base to thigh after the
radiotracer. CT data was obtained and used for attenuation
correction and anatomic localization.

[Series 3: pet sk_thigh ac · axial · 5.0mm · 4.07mm/px · z∈[-1088,-316]mm · 5 of 194 slices shown]
[im 1/194]
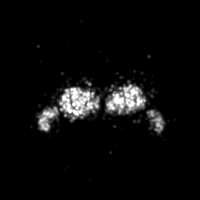
[im 49/194]
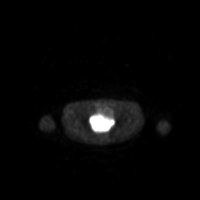
[im 97/194]
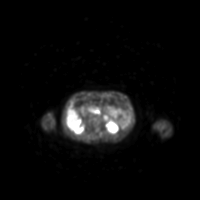
[im 145/194]
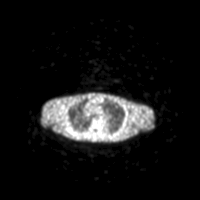
[im 194/194]
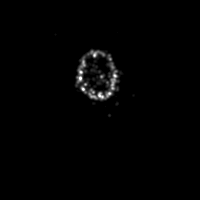

[Series 4: ct sk_thigh 5.0 bf37 · axial · 5.0mm · 0.98mm/px · z∈[-1088,-316]mm · 5 of 194 slices shown]
[im 1/194]
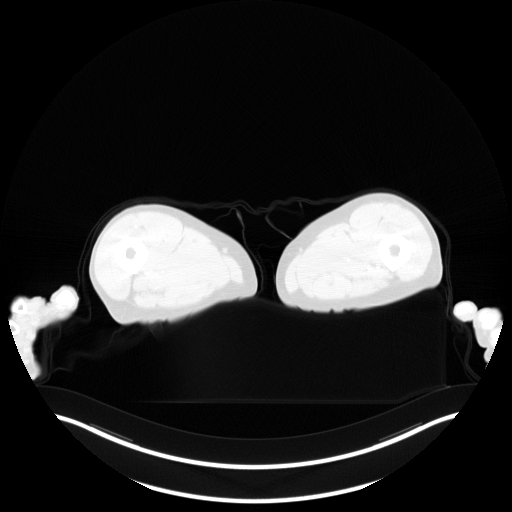
[im 49/194]
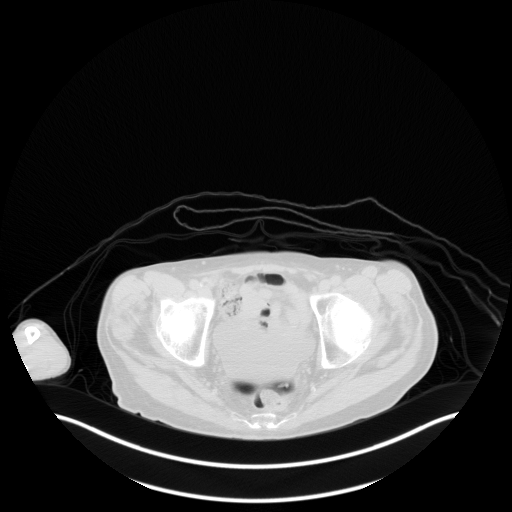
[im 97/194]
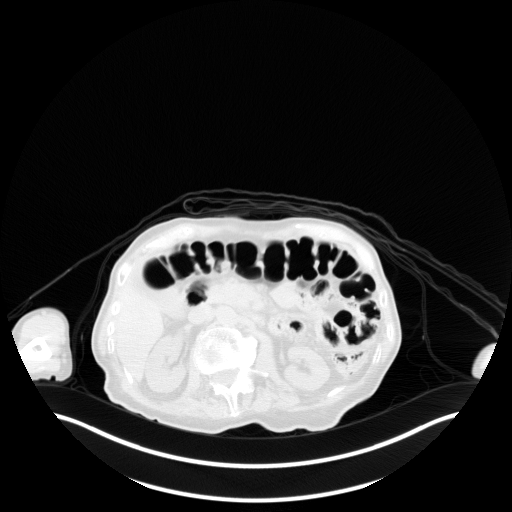
[im 145/194]
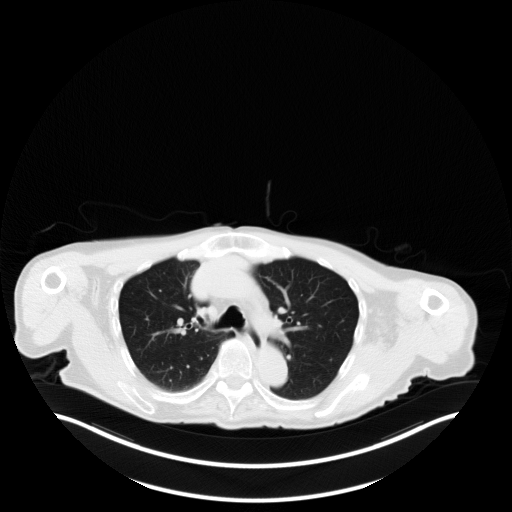
[im 194/194  brain]
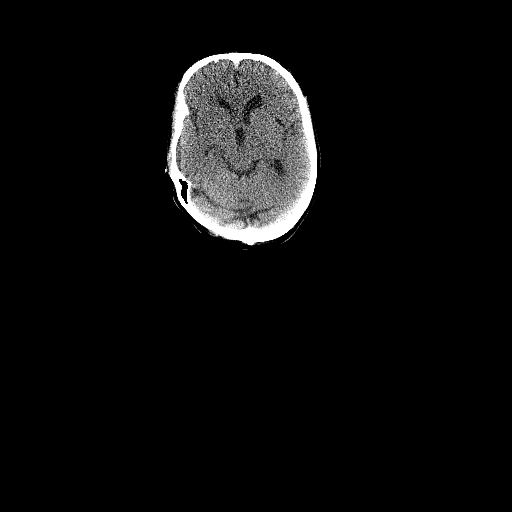

[Series 5: pet sk_thigh nac · axial · 5.0mm · 4.07mm/px · z∈[-1088,-316]mm · 5 of 194 slices shown]
[im 1/194]
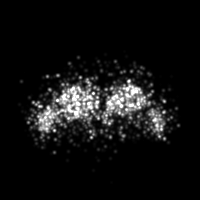
[im 49/194]
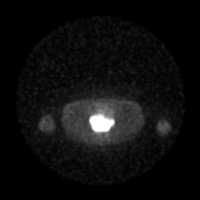
[im 97/194]
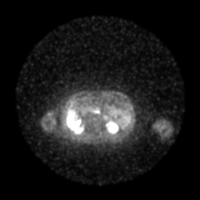
[im 145/194]
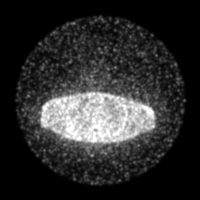
[im 194/194]
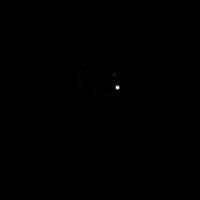

[Series 8: ct sk_thigh 5.0 br59 lung_bone · axial · 5.0mm · 0.68mm/px · z∈[-663,-419]mm · 2 of 62 slices shown]
[im 1/62]
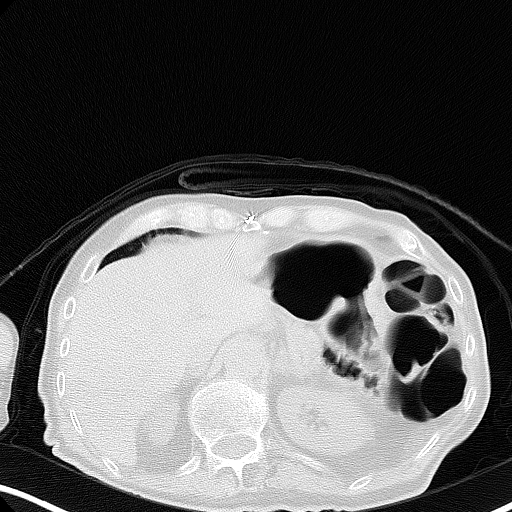
[im 62/62]
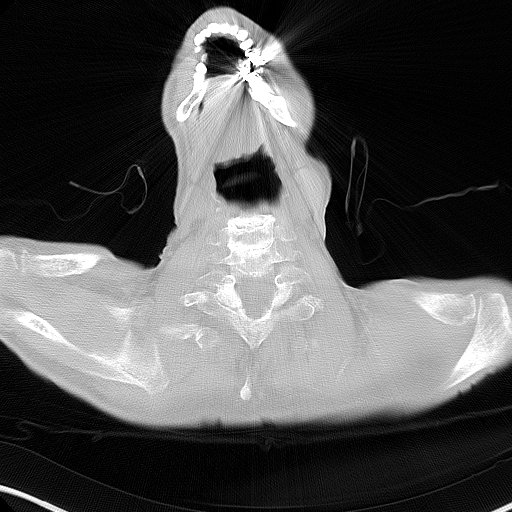

[Series 603: fused cor · 2 of 70 slices shown]
[im 1/70]
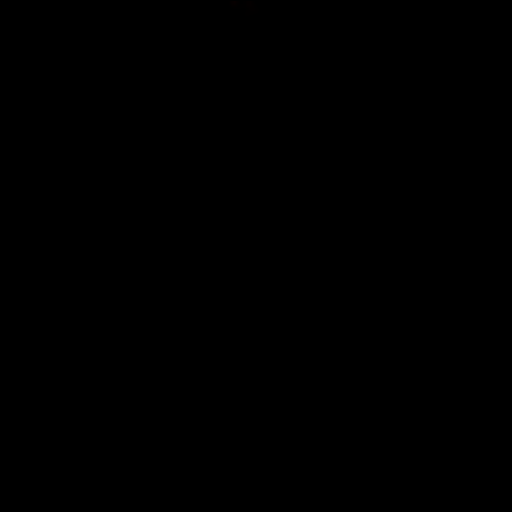
[im 70/70]
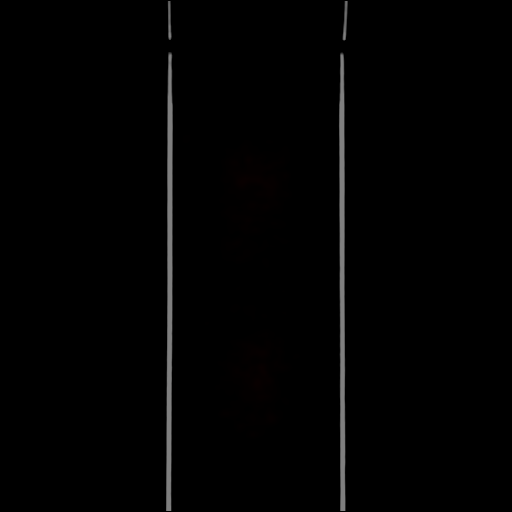

[Series 604: <mip collection> · coronal · 1.68mm/px · 1 of 32 slices shown]
[im 1/32]
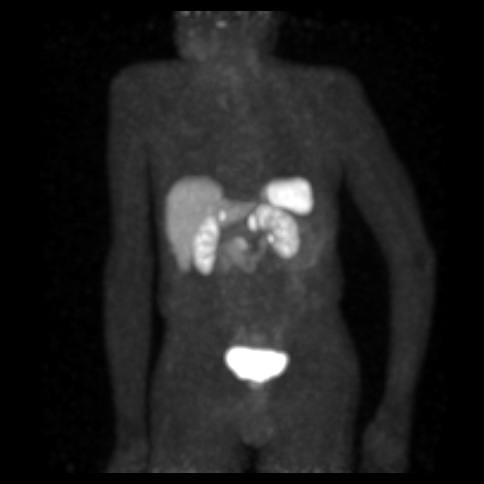

[Series 605: range-ac ct sk_thigh 5.0 hd_fov-tra-<alpha range> · 5 of 189 slices shown]
[im 1/189]
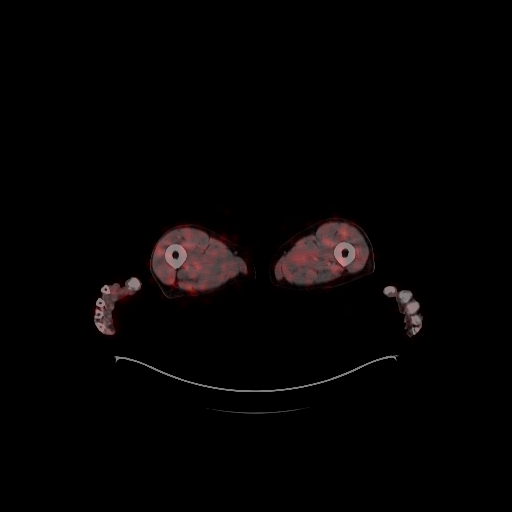
[im 48/189]
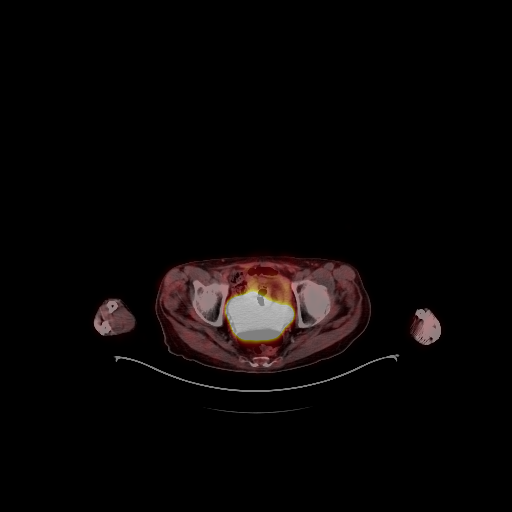
[im 95/189]
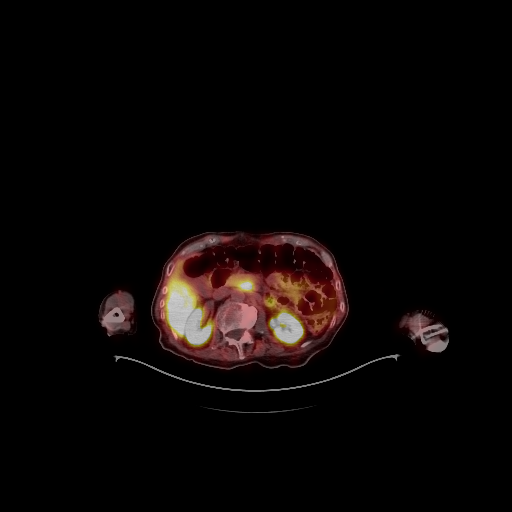
[im 142/189]
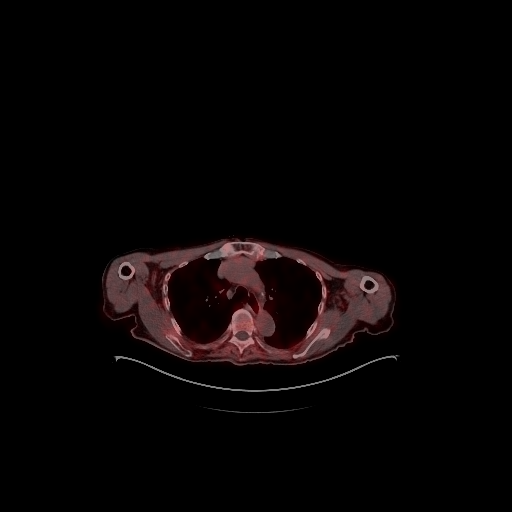
[im 189/189]
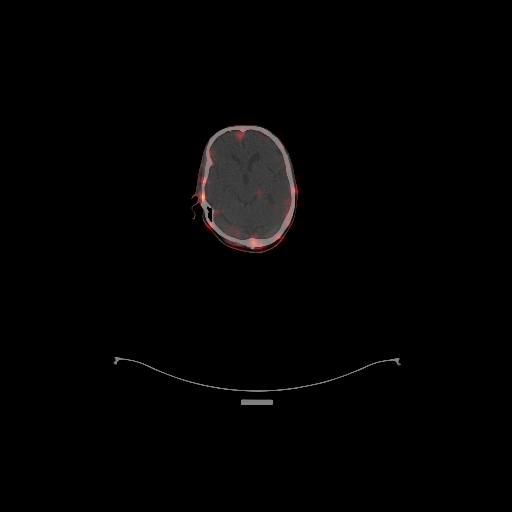

[25 of 25 positions shown; findings below may reference images not displayed]

FINDINGS: NECK

No radiotracer activity in neck lymph nodes.

Incidental CT findings: None

CHEST

No radiotracer accumulation within mediastinal or hilar lymph nodes.
No suspicious pulmonary nodules on the CT scan.

Incidental CT finding:Post CABG

ABDOMEN/PELVIS

Physiologic activity noted in the adrenal glands. No adrenal gland
enlargement. No retroperitoneal periaortic mass. No evidence of
abnormal somatostatin receptor uptake within the abdomen or pelvis.

Physiologic activity noted in the liver, spleen, adrenal glands and
kidneys.

Incidental CT findings:None

SKELETON

No focal activity to suggest skeletal metastasis.

Incidental CT findings:None
IMPRESSION: 1. No evidence of paraganglioma on DOTATATE (somatostatin receptor)
PET scan.
2. Physiologic activity within the adrenal glands.

## 2020-07-12 MED ORDER — COPPER CU 64 DOTATATE 1 MCI/ML IV SOLN
4.0000 | Freq: Once | INTRAVENOUS | Status: AC
  Administered 2020-07-12: 4 via INTRAVENOUS

## 2020-07-13 ENCOUNTER — Telehealth: Payer: Self-pay

## 2020-07-13 NOTE — Telephone Encounter (Signed)
Called pt and informed pt to call [facility] at [phone] to discuss status of machine. He states that he will call.  Pt states that he has NEVER had an appointment to discuss sleep test with Dr [name]. Pt is very upset and would like to discuss this with Dr [name].  Pt would like a call to discuss CPAP/issues. I offered pt an appointment but he had already hung up. Please call pt to discuss

## 2020-07-13 NOTE — Telephone Encounter (Signed)
-----   Message from Lauralee Evener, [place] sent at 07/09/2020  2:11 PM EDT ----- Regarding: RE: CPAP?? Sorry. No I have no idea. I have already told the patient that due to the back order it  can take up to 6 months or more. He can call [facility] to see where he is on the list. Which I also told him. I have not a clue. [phone]. ----- Message ----- From: Waylan Rocher, LPN Sent: 02/28/9066   9:08 AM EDT To: Lauralee Evener, CMA, Waylan Rocher, LPN Subject: CPAP??                                         Hi Mariann Laster This pt is waiting for his CPAP machine, c/o fatigue and BP issues. Is there a estimate when this pt will receive his CPAP?  Thanks [employer]

## 2020-07-16 DIAGNOSIS — R002 Palpitations: Secondary | ICD-10-CM | POA: Diagnosis not present

## 2020-07-16 DIAGNOSIS — I482 Chronic atrial fibrillation, unspecified: Secondary | ICD-10-CM | POA: Diagnosis not present

## 2020-07-16 DIAGNOSIS — R251 Tremor, unspecified: Secondary | ICD-10-CM | POA: Diagnosis not present

## 2020-07-16 DIAGNOSIS — R634 Abnormal weight loss: Secondary | ICD-10-CM | POA: Diagnosis not present

## 2020-07-16 DIAGNOSIS — Z6822 Body mass index (BMI) 22.0-22.9, adult: Secondary | ICD-10-CM | POA: Diagnosis not present

## 2020-07-16 DIAGNOSIS — D6859 Other primary thrombophilia: Secondary | ICD-10-CM | POA: Diagnosis not present

## 2020-07-16 DIAGNOSIS — R899 Unspecified abnormal finding in specimens from other organs, systems and tissues: Secondary | ICD-10-CM | POA: Diagnosis not present

## 2020-07-18 ENCOUNTER — Encounter: Payer: Self-pay | Admitting: Neurology

## 2020-07-18 ENCOUNTER — Other Ambulatory Visit: Payer: Self-pay

## 2020-07-18 ENCOUNTER — Ambulatory Visit (INDEPENDENT_AMBULATORY_CARE_PROVIDER_SITE_OTHER): Payer: Medicare Other | Admitting: Neurology

## 2020-07-18 VITALS — BP 123/84 | HR 101 | Ht 66.0 in | Wt 119.0 lb

## 2020-07-18 DIAGNOSIS — G250 Essential tremor: Secondary | ICD-10-CM

## 2020-07-18 NOTE — Patient Instructions (Signed)
Would recommend Primidone  Primidone tablets What is this medication? PRIMIDONE (PRI mi done) is a barbiturate. This medicine is used to control seizures in certain types of epilepsy. Also used in tremors.  This medicine may be used for other purposes; ask your health care provider orpharmacist if you have questions. COMMON BRAND NAME(S): Mysoline What should I tell my care team before I take this medication? They need to know if you have any of these conditions: kidney disease liver disease porphyria suicidal thoughts, plans, or attempt; a previous suicide attempt by you or a family member an unusual or allergic reaction to primidone, phenobarbital, other barbiturates or seizure medications, other medicines, foods, dyes, or preservatives pregnant or trying to get pregnant breast-feeding How should I use this medication? Take this medicine by mouth with a glass of water. Follow the directions on the prescription label. Take your doses at regular intervals. Do not take your medicine more often than directed. Do not stop taking except on the advice ofyour doctor or health care professional. A special MedGuide will be given to you by the pharmacist with eachprescription and refill. Be sure to read this information carefully each time. Contact your pediatrician or health care professional regarding the use of this medication in children. Special care may be needed. While this drug may beprescribed for children for selected conditions, precautions do apply. Overdosage: If you think you have taken too much of this medicine contact apoison control center or emergency room at once. NOTE: This medicine is only for you. Do not share this medicine with others. What if I miss a dose? If you miss a dose, take it as soon as you can. If it is almost time for yournext dose, take only that dose. Do not take double or extra doses. What may interact with this medication? Do not take this medicine with any of  the following medications: voriconazole This medicine may also interact with the following medications: cancer-treating medications cyclosporine disopyramide doxycycline male hormones, including contraceptive or birth control pills medicines for mental depression, anxiety or other mood problems medicines for treating HIV infection or AIDS modafinil prescription pain medications quinidine warfarin This list may not describe all possible interactions. Give your health care provider a list of all the medicines, herbs, non-prescription drugs, or dietary supplements you use. Also tell them if you smoke, drink alcohol, or use illegaldrugs. Some items may interact with your medicine. What should I watch for while using this medication? Visit your doctor or health care professional for regular checks on your progress. It may be 2 to 3 weeks before you see the full effects of this medicine. Do not suddenly stop taking this medicine, you may increase the risk of seizures. Your doctor or health care professional may want to gradually reduce the dose. Wear a medical identification bracelet or chain to say youhave epilepsy, and carry a card that lists all your medications. You may get drowsy or dizzy. Do not drive, use machinery, or do anything that needs mental alertness until you know how this medicine affects you. Do not stand or sit up quickly, especially if you are an older patient. This reduces the risk of dizzy or fainting spells. Alcohol may interfere with the effect ofthis medicine. Avoid alcoholic drinks. Birth control pills may not work properly while you are taking this medicine.Talk to your doctor about using an extra method of birth control. The use of this medicine may increase the chance of suicidal thoughts or actions. Pay special attention  to how you are responding while on this medicine. Any worsening of mood, or thoughts of suicide or dying should bereported to your health care  professional right away. Women who become pregnant while using this medicine may enroll in the [place] Pregnancy Registry by calling [id number]. This registry collects information about the safety of antiepileptic drug use duringpregnancy. This medicine may cause a decrease in vitamin D and folic acid. You should make sure that you get enough vitamins while you are taking this medicine. Discussthe foods you eat and the vitamins you take with your health care professional. What side effects may I notice from receiving this medication? Side effects that you should report to your doctor or health care professionalas soon as possible: allergic reactions like skin rash, itching or hives, swelling of the face, lips, or tongue blurred, double vision, or uncontrollable rolling or movements of the eyes redness, blistering, peeling or loosening of the skin, including inside the mouth shortness of breath or difficulty breathing unusual excitement or restlessness, more likely in children and the elderly unusually weak or tired worsening of mood, thoughts or actions of suicide or dying Side effects that usually do not require medical attention (report to yourdoctor or health care professional if they continue or are bothersome): clumsiness, unsteadiness, or a hang-over effect decreased sexual ability dizziness, drowsiness loss of appetite nausea or vomiting This list may not describe all possible side effects. Call your doctor for medical advice about side effects. You may report side effects to FDA at1-800-FDA-1088. Where should I keep my medication? Keep out of the reach of children. This medicine may cause accidental overdose and death if it taken by other adults, children, or pets. Mix any unused medicine with a substance like cat litter or coffee grounds. Then throw the medicine away in a sealed container like a sealed bag or a coffee can with a lid. Do not use the medicine  after theexpiration date. Store at room temperature between 15 and 30 degrees C (59 and 86 degrees F). NOTE: This sheet is a summary. It may not cover all possible information. If you have questions about this medicine, talk to your doctor, pharmacist, orhealth care provider.  2022 Elsevier/Gold Standard (2016-08-20 15:21:47)

## 2020-07-18 NOTE — Progress Notes (Signed)
[facility]    Provider:  Dr Jaynee Eagles Requesting Provider: [name], MD Primary Care Provider:  [name], MD  CC:  Tremors  Interval history 07/18/2020: his tremors are worsening. He is having breathing problems, he feels he is not exercising like he should. He cannot walk, he only walks the length of 2 houses, he spoke to Dr. Orene Desanctis about a beta-blocker, he took 1/2 of a tablet and 2 hours later he was passing out, admitted for hypotension after taking cardura. We discussed primidone at bedtime. He declines trying gabapentin. I gave him a lot of literature on ET and treatments.   HPI:  Austin Davis is a 80 y.o. male here as requested by [name], MD for Tremor. PMHx A. fib on Coumadin following valvular repair, tremors, memory difficulties, daytime somnolence, secondary hypercoagulable state, muscle cramping, migraines, malaise and fatigue.  Patient was seen in 2017 for tremors and following is a review of those records: he started noticing tremors after his heart surgery in 2005, more in the face and hands, also in the legs occasionally, affecting his handwriting, difficulty eating, feeling in his face as well, stress makes it worse in fact very affected by stress and anxiety, stressful environment, he has a family history of tremor in his father, no caffeine or energy drinks, felt it was slowly progressive without any other focal deficits, his thyroid was tested.  Examination showed a high-frequency low amplitude postural tremor in the head and upper extremities with a kinetic component, no resting tremor.  At the time when he was seen in 2017 we discussed an MRI of the brain which he declined, we tried gabapentin as needed and we also discussed possibly trying propranolol, he did not follow-up after that so I am not unsure how these medications worked or if he even tried them.    I also reviewed Dr. Alan Ripper notes: Dr. Harrington Challenger stated the tremor is  worsening and now affecting his voice, labs include normal TSH 2.24, normal vitamin D, CMP showed BUN 19 and creatinine 1.12 otherwise unremarkable, unremarkable CBC, B12 greater than 1500 and sed rate normal 7, labs were collected August 15, 2019.    Patient is here alone and states his tremor is worse. He did not try Neurontin because he read it was addictive. He feels very sleepy. He just had a sleep evaluation. He is worried the tremor is something else. He feels rotten, tired a lot, no energy, no weakness per se, he feels sleepy in the daytime and problems sleeping, he naps a lot. He notices the tremor in his hands and also in his voice and head. He has some cramps in his limbs and hands. He has been trying magnesium which has not helped. He has not had any falls. He spent the winter "hunkered down" and he thinks maybe he has a lot of disuse. He notices the tremor when doing things, not really at rest. Writing is impossible. He feels tremors in his legs too "sometimes". The tremor is worse with stress. It is continuous.   Reviewed notes, labs and imaging from outside physicians, which showed: see above  Review of Systems: Patient complains of symptoms per HPI as well as the following symptoms: tremor . Pertinent negatives and positives per HPI. All others negative    Social History   Socioeconomic History   Marital status: Single    Spouse name: Not on file   Number of children: 2   Years of  education: 16   Highest education level: Not on file  Occupational History   Occupation: [employer]  Tobacco Use   Smoking status: Never   Smokeless tobacco: Never   Tobacco comments:    only tried a few cigarettes when he was younger  Substance and Sexual Activity   Alcohol use: Yes    Comment: "probably a 6 pack of beer a year"   Drug use: No   Sexual activity: Not on file  Other Topics Concern   Not on file  Social History Narrative   Lives with girlfriend   Caffeine use:  rarely    Right handed   Social Determinants of Health   Financial Resource Strain: Not on file  Food Insecurity: Not on file  Transportation Needs: Not on file  Physical Activity: Not on file  Stress: Not on file  Social Connections: Not on file  Intimate Partner Violence: Not on file    Family History  Problem Relation Age of Onset   Heart disease Mother    CAD Mother    Migraines Mother    Heart failure Father    Skin cancer Father    Valvular heart disease Father        prolapsed valve   Tremor Father        "probably had a bit of tremor in his old age"   Heart failure Maternal Grandmother    Pneumonia Maternal Grandfather    Heart failure Paternal Grandmother    Stroke Paternal Grandfather    Asthma Daughter    Epilepsy Son    Parkinson's disease Neg Hx     Past Medical History:  Diagnosis Date   CHF (congestive heart failure) ([place])    Coronary artery disease    GI bleed 2019   hospitalized at [facility] for one week   Headache    since childhood   Low blood sugar    since childhood, controlled by diet   Mitral regurgitation    Mitral valve prolapse    Osteopenia 2021   Paroxysmal atrial fibrillation ([place])     Patient Active Problem List   Diagnosis Date Noted   Hypotension 06/30/2020   GIB (gastrointestinal bleeding) 02/17/2017   Rectal bleeding 02/16/2017   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   Essential tremor 02/19/2015   Status post mitral valve annuloplasty and MAZE 2005 12/12/2014   HLD (hyperlipidemia) 05/05/2013   DOE (dyspnea on exertion) 04/22/2013   Fatigue 04/22/2013   Atrial fibrillation ([place]) 05/08/2012   Long term (current) use of anticoagulants 05/08/2012    Past Surgical History:  Procedure Laterality Date   COLONOSCOPY WITH PROPOFOL N/A 02/19/2017   Procedure: COLONOSCOPY WITH PROPOFOL;  Surgeon: Wilford Corner, MD;  Location: [facility];  Service: Endoscopy;  Laterality: N/A;   laser eye surgery for  retina detachment     MITRAL VALVE REPAIR  01/2003   monitor  02/05/2006   polyp removal     TONSILLECTOMY     tooth removal     as a teenager    Current Outpatient Medications  Medication Sig Dispense Refill   acetaminophen (TYLENOL) 500 MG tablet Take 500 mg by mouth every 6 (six) hours as needed for mild pain or headache.     Ascorbic Acid (VITAMIN C IMMUNE HEALTH PO) Take 500 mg by mouth.     B Complex Vitamins (VITAMIN B COMPLEX PO) Take 1 tablet by mouth daily.     Cholecalciferol (D3) 50 MCG (  2000 UT) TABS Take 2,000 Units by mouth daily.     Garlic 882 MG TABS Take 100 mg by mouth daily.     loratadine (CLARITIN) 10 MG tablet Take 10 mg by mouth daily as needed for allergies.     Magnesium 250 MG TABS Take 250 mg by mouth daily.     Omega-3 Fatty Acids (FISH OIL PO) Take 1 capsule by mouth daily.     warfarin (COUMADIN) 5 MG tablet Take 1 tablet (5 mg total) by mouth daily.     No current facility-administered medications for this visit.    Allergies as of 07/18/2020 - Review Complete 07/18/2020  Allergen Reaction Noted   Prednisone Other (See Comments) 05/03/2013   Cortisone Palpitations 11/16/2019    Vitals: BP 123/84 (BP Location: Right Arm, Patient Position: Sitting)   Pulse (!) 101   Ht 5\' 6"  (1.676 m)   Wt 119 lb (54 kg)   BMI 19.21 kg/m  Last Weight:  Wt Readings from Last 1 Encounters:  07/18/20 119 lb (54 kg)   Last Height:   Ht Readings from Last 1 Encounters:  07/18/20 5\' 6"  (1.676 m)   Exam: NAD, pleasant                  Speech:    Speech is normal; fluent and spontaneous with normal comprehension.  Cognition:    The patient is oriented to person, place, and time;     recent and remote memory intact;     language fluent;    Cranial Nerves:    The pupils are equal, round, and reactive to light.Trigeminal sensation is intact and the muscles of mastication are normal. The face is symmetric. The palate elevates in the midline. Hearing intact. Voice  is normal. Shoulder shrug is normal. The tongue has normal motion without fasciculations.   Coordination:  No dysmetria  Motor Observation:    High freq,low amp upper extremity and head tremor. Tone:    Normal muscle tone.     Strength:    Strength is symmetrical in the upper and lower limbs. No focal deficit.     Sensation: intact to LT  Tone:    Normal muscle tone.  No cogwheeling.   DTR: absent AJs   Assessment/Plan:  Gentleman with tremors. Declined gabapentin in the past. Could not tolerate b-blockers. He has head, chin, voice and hand postural >> resting tremors. Progressive.  I don't see any other parkinsonian features, his stride and arm swing are good, tone is normal. I still think this is essential tremor.   - He was tested and diagnosed with OSA, starting cpap - Recommend primidone. do not recommend b-blockers (Dr. Orene Desanctis tried and he had syncopal episode per patient). He declines gabapentin.discussed risks vs benefits. Sent messages to cardiologist and pcp.May interact with his wrafarin will let him know. Gave him reading materials.   Cc: [name], MD  No orders of the defined types were placed in this encounter.  Sarina Ill, MD  [facility] [address] [place], [place] [address]  Phone [phone] Fax [phone]  I spent over 40  minutes of face-to-face and non-face-to-face time with patient on the  1. Essential tremor    diagnosis.  This included previsit chart review, lab review, study review, order entry, electronic health record documentation, patient education on the different diagnostic and therapeutic options, counseling and coordination of care, risks and benefits of management, compliance, or risk factor reduction

## 2020-07-22 DIAGNOSIS — R55 Syncope and collapse: Secondary | ICD-10-CM

## 2020-07-24 ENCOUNTER — Telehealth: Payer: Self-pay

## 2020-07-24 NOTE — Telephone Encounter (Signed)
We received a voicemail from this patient asking about a medication that was discussed at his visit with Dr Jaynee Eagles, he is requesting a call back to discuss same. He did not mention the name of the medication in the voicemail.

## 2020-07-24 NOTE — Telephone Encounter (Signed)
I called pt to relay message from Dr. Jaynee Eagles about medication offered.  Could not LM. Will try again later.

## 2020-07-25 ENCOUNTER — Other Ambulatory Visit: Payer: Self-pay | Admitting: Neurology

## 2020-07-25 ENCOUNTER — Telehealth: Payer: Self-pay | Admitting: General Practice

## 2020-07-25 MED ORDER — PRIMIDONE 50 MG PO TABS: ORAL_TABLET | ORAL | 6 refills | Status: DC

## 2020-07-25 NOTE — Telephone Encounter (Signed)
I called pt.  I relayed message per Dr. Jaynee Davis  Received: Austin Davis, Austin Bras, MD  P [facility] Calls Caller: Unspecified (Yesterday, 11:16 AM) We discussed trying primidone. Dr. Harrington Challenger and his cardiologist were ok with trying it. But please discuss it may interact slightly with his warfarin so he will have to have his INR checked more frequently possibly every week for a few weeks intially after starting. If he is ok with that and he can get his INR checked more frequently at least initially and when we make changes, then we can start very low.    Pt verbalized understanding of these instructions.  He will contact the INR coumadin clinic about getting checked once weekly for several week, after starting the primidone.

## 2020-07-25 NOTE — Telephone Encounter (Signed)
No answer

## 2020-07-25 NOTE — Telephone Encounter (Signed)
pt. Is calling his PCP is starting him on some new medication and needed to get the ok from his cardiologist

## 2020-07-25 NOTE — Telephone Encounter (Signed)
Called patient, advised of medication from PharmD.   Patient verbalized understanding.

## 2020-07-25 NOTE — Telephone Encounter (Signed)
He is okay to start primidone.  It can potentially alter INR, but he has appointment for INR check on July 20, which is perfect to determine if any dose adjustments need to be made

## 2020-07-25 NOTE — Telephone Encounter (Signed)
Patient was told to start Primidone (unaware of dosage) but would like to know if okay to take this with his other medications and if any other effects.  Advised with patient I would send over to PharmD to advise.  Thanks!

## 2020-07-26 DIAGNOSIS — I480 Paroxysmal atrial fibrillation: Secondary | ICD-10-CM | POA: Diagnosis not present

## 2020-07-26 DIAGNOSIS — D6869 Other thrombophilia: Secondary | ICD-10-CM | POA: Diagnosis not present

## 2020-07-26 DIAGNOSIS — R251 Tremor, unspecified: Secondary | ICD-10-CM | POA: Diagnosis not present

## 2020-07-26 DIAGNOSIS — E46 Unspecified protein-calorie malnutrition: Secondary | ICD-10-CM | POA: Diagnosis not present

## 2020-07-26 NOTE — Telephone Encounter (Signed)
I called pt, sounds like someone answered then nothing.  I called back but no answer.

## 2020-07-30 ENCOUNTER — Encounter: Payer: Self-pay | Admitting: *Deleted

## 2020-07-30 NOTE — Telephone Encounter (Signed)
Attempted to call pt and was not able to LM.  Will try again later.

## 2020-07-30 NOTE — Telephone Encounter (Signed)
I called pt and no answer.  Sent message in mail.

## 2020-08-02 DIAGNOSIS — I480 Paroxysmal atrial fibrillation: Secondary | ICD-10-CM | POA: Diagnosis not present

## 2020-08-02 DIAGNOSIS — R251 Tremor, unspecified: Secondary | ICD-10-CM | POA: Diagnosis not present

## 2020-08-02 DIAGNOSIS — D6869 Other thrombophilia: Secondary | ICD-10-CM | POA: Diagnosis not present

## 2020-08-02 DIAGNOSIS — Z7901 Long term (current) use of anticoagulants: Secondary | ICD-10-CM | POA: Diagnosis not present

## 2020-08-02 DIAGNOSIS — E46 Unspecified protein-calorie malnutrition: Secondary | ICD-10-CM | POA: Diagnosis not present

## 2020-08-03 DIAGNOSIS — I480 Paroxysmal atrial fibrillation: Secondary | ICD-10-CM | POA: Diagnosis not present

## 2020-08-03 DIAGNOSIS — D6869 Other thrombophilia: Secondary | ICD-10-CM | POA: Diagnosis not present

## 2020-08-03 DIAGNOSIS — N200 Calculus of kidney: Secondary | ICD-10-CM | POA: Diagnosis not present

## 2020-08-03 DIAGNOSIS — Z7901 Long term (current) use of anticoagulants: Secondary | ICD-10-CM | POA: Diagnosis not present

## 2020-08-03 DIAGNOSIS — R251 Tremor, unspecified: Secondary | ICD-10-CM | POA: Diagnosis not present

## 2020-08-03 DIAGNOSIS — R0602 Shortness of breath: Secondary | ICD-10-CM | POA: Diagnosis not present

## 2020-08-09 DIAGNOSIS — I480 Paroxysmal atrial fibrillation: Secondary | ICD-10-CM | POA: Diagnosis not present

## 2020-08-09 DIAGNOSIS — Z7901 Long term (current) use of anticoagulants: Secondary | ICD-10-CM | POA: Diagnosis not present

## 2020-08-09 DIAGNOSIS — R251 Tremor, unspecified: Secondary | ICD-10-CM | POA: Diagnosis not present

## 2020-08-09 DIAGNOSIS — R519 Headache, unspecified: Secondary | ICD-10-CM | POA: Diagnosis not present

## 2020-08-13 DIAGNOSIS — N401 Enlarged prostate with lower urinary tract symptoms: Secondary | ICD-10-CM | POA: Diagnosis not present

## 2020-08-13 DIAGNOSIS — R3912 Poor urinary stream: Secondary | ICD-10-CM | POA: Diagnosis not present

## 2020-08-13 DIAGNOSIS — R55 Syncope and collapse: Secondary | ICD-10-CM | POA: Diagnosis not present

## 2020-08-22 NOTE — Progress Notes (Deleted)
Cardiology Clinic Note   Patient Name: GIANI BETZOLD Date of Encounter: 08/22/2020  Primary Care Provider:  [name], MD Primary Cardiologist:  Sanda Klein, MD  Patient Profile    JHONATAN LOMELI 80 year old male presents to the clinic today for follow-up evaluation of his syncope and paroxysmal atrial fibrillation.  Past Medical History    Past Medical History:  Diagnosis Date   CHF (congestive heart failure) ([place])    Coronary artery disease    GI bleed 2019   hospitalized at [facility] for one week   Headache    since childhood   Low blood sugar    since childhood, controlled by diet   Mitral regurgitation    Mitral valve prolapse    Osteopenia 2021   Paroxysmal atrial fibrillation ([place])    Past Surgical History:  Procedure Laterality Date   COLONOSCOPY WITH PROPOFOL N/A 02/19/2017   Procedure: COLONOSCOPY WITH PROPOFOL;  Surgeon: Wilford Corner, MD;  Location: [facility];  Service: Endoscopy;  Laterality: N/A;   laser eye surgery for retina detachment     MITRAL VALVE REPAIR  01/2003   monitor  02/05/2006   polyp removal     TONSILLECTOMY     tooth removal     as a teenager    Allergies  Allergies  Allergen Reactions   Prednisone Other (See Comments)    Makes skin crawl, rapid HR   Cortisone Palpitations    History of Present Illness    MARQUAVIOUS NAZAR is a 80 y.o. male who has a PMH of atrial fibrillation, hypotension, rectal bleeding, GI bleed, essential tremor, DOE, fatigue, HLD, and is status post mitral valve annuloplasty/Maze procedure 2005.   He was seen by Dr. Sallyanne Kuster on 04/06/2020.  During that time he continued to complain of fatigue.  He reported he felt very weak and tired all the time.  He felt like he was fighting to stay awake.  He had lost weight.  He continued to sleep poorly at night.  He reported he was taking naps in the afternoon and was still able to fall asleep during the day.  He is not obese.  His thyroid  checked and showed normal thyroid function.  His hemoglobin once 16.3.  He underwent home sleep study in October and had CPAP titration in January.  Study showed that he was positive for OSA.  He had not received his machine yet due to it being on backorder.   His blood pressure was slightly elevated.  He reported at home his blood pressure was typically in the 130s over 80s.  He denied falls, injuries and bleeding issues related to anticoagulation.  He was following with neurology for his worsening essential tremor.  He was not taking any medications for this.   He was admitted to the hospital on 06/30/2020 with hypotension and discharged on 07/01/2020.   Was connected with via phone 07/06/2020.  He contacted the office and reported that he did not feel well enough to drive in that morning.  His visit was transitioned to virtual.  He continued to feel weak and fatigued.  We reviewed his hospitalization labs, and EKG.  He had not yet received his CPAP device.  We reviewed his blood pressure since he has been home which were quite labile.  He had blood pressures in the 272Z systolic, 366 systolic, and that morning his blood pressure was 155/104.  He reported that he was working with neurologist and would have studies  done on his adrenal glands.  He had no further episodes of syncope but did report that he was not eating or drinking well and had lost several pounds.  He did note periods of missed heartbeats on a weekly basis.  He reported that his episodes lasted for around 5 minutes.  We reviewed his paroxysmal atrial fibrillation.  He stated that since the COVID-19 pandemic he had been much less active.  He continued to try to walk in his neighborhood but was taking more frequent rest periods.  He reported that he woke up fatigued.  I have asked him to increase his hydration, increase 6-calorie intake, and increase his physical activity.  I ordered a cardiac event monitor, checked in with Dr. [name] to see if we  can expedite his CPAP, and planned his follow-up in 6 to 8 weeks.  His cardiac event monitor 08/13/2020 showed predominantly normal sinus rhythm with no significant episodes of bradycardia and no evidence of atrial flutter or atrial fibrillation.  He did have rare episodes of PVCs.  His symptoms of feeling flushed, warm, and weak were associated with mild sinus tachycardia.  He presents to the clinic today for follow-up evaluation states***   Today he denies chest pain, shortness of breath, lower extremity edema,  melena, hematuria, hemoptysis, diaphoresis, weakness, presyncope, syncope, orthopnea, and PND.  Home Medications    Prior to Admission medications   Medication Sig Start Date End Date Taking? Authorizing Provider  acetaminophen (TYLENOL) 500 MG tablet Take 500 mg by mouth every 6 (six) hours as needed for mild pain or headache.    [provider]  Ascorbic Acid (VITAMIN C IMMUNE HEALTH PO) Take 500 mg by mouth.    [provider]  B Complex Vitamins (VITAMIN B COMPLEX PO) Take 1 tablet by mouth daily.    [provider]  Cholecalciferol (D3) 50 MCG (2000 UT) TABS Take 2,000 Units by mouth daily.    [provider]  Garlic 102 MG TABS Take 100 mg by mouth daily.    [provider]  loratadine (CLARITIN) 10 MG tablet Take 10 mg by mouth daily as needed for allergies.    [provider]  Magnesium 250 MG TABS Take 250 mg by mouth daily.    [provider]  Omega-3 Fatty Acids (FISH OIL PO) Take 1 capsule by mouth daily.    [provider]  primidone (MYSOLINE) 50 MG tablet Start with 1/2 pill at bedtime(25mg ). If no side effects in 2 weeks, may increase to a whole tablet(50mg ) at bedtime otherwise continue with 1/2 pill(25mg ) 07/25/20   Melvenia Beam, MD  warfarin (COUMADIN) 5 MG tablet Take 1 tablet (5 mg total) by mouth daily. 07/01/20   Oswald Hillock, MD    Family History    Family History  Problem Relation Age  of Onset   Heart disease Mother    CAD Mother    Migraines Mother    Heart failure Father    Skin cancer Father    Valvular heart disease Father        prolapsed valve   Tremor Father        "probably had a bit of tremor in his old age"   Heart failure Maternal Grandmother    Pneumonia Maternal Grandfather    Heart failure Paternal Grandmother    Stroke Paternal Grandfather    Asthma Daughter    Epilepsy Son    Parkinson's disease Neg Hx    He  indicated that his mother is deceased. He indicated that his father is deceased. He indicated that his sister is alive. He indicated that his maternal grandmother is deceased. He indicated that his maternal grandfather is deceased. He indicated that his paternal grandmother is deceased. He indicated that his paternal grandfather is deceased. He indicated that his daughter is alive. He indicated that his son is alive. He indicated that the status of his neg hx is unknown.  Social History    Social History   Socioeconomic History   Marital status: Single    Spouse name: Not on file   Number of children: 2   Years of education: 16   Highest education level: Not on file  Occupational History   Occupation: [employer]  Tobacco Use   Smoking status: Never   Smokeless tobacco: Never   Tobacco comments:    only tried a few cigarettes when he was younger  Substance and Sexual Activity   Alcohol use: Yes    Comment: "probably a 6 pack of beer a year"   Drug use: No   Sexual activity: Not on file  Other Topics Concern   Not on file  Social History Narrative   Lives with girlfriend   Caffeine use: rarely    Right handed   Social Determinants of Health   Financial Resource Strain: Not on file  Food Insecurity: Not on file  Transportation Needs: Not on file  Physical Activity: Not on file  Stress: Not on file  Social Connections: Not on file  Intimate Partner Violence: Not on file     Review of Systems    General:  No  chills, fever, night sweats or weight changes.  Cardiovascular:  No chest pain, dyspnea on exertion, edema, orthopnea, palpitations, paroxysmal nocturnal dyspnea. Dermatological: No rash, lesions/masses Respiratory: No cough, dyspnea Urologic: No hematuria, dysuria Abdominal:   No nausea, vomiting, diarrhea, bright red blood per rectum, melena, or hematemesis Neurologic:  No visual changes, wkns, changes in mental status. All other systems reviewed and are otherwise negative except as noted above.  Physical Exam    VS:  There were no vitals taken for this visit. , BMI There is no height or weight on file to calculate BMI. GEN: Well nourished, well developed, in no acute distress. HEENT: normal. Neck: Supple, no JVD, carotid bruits, or masses. Cardiac: RRR, no murmurs, rubs, or gallops. No clubbing, cyanosis, edema.  Radials/DP/PT 2+ and equal bilaterally.  Respiratory:  Respirations regular and unlabored, clear to auscultation bilaterally. GI: Soft, nontender, nondistended, BS + x 4. MS: no deformity or atrophy. Skin: warm and dry, no rash. Neuro:  Strength and sensation are intact. Psych: Normal affect.  Accessory Clinical Findings    Recent Labs: 04/06/2020: TSH 2.160 06/30/2020: ALT 22 07/01/2020: BUN 18; Creatinine, Ser 0.86; Hemoglobin 13.4; Platelets 166; Potassium 4.0; Sodium 140   Recent Lipid Panel    Component Value Date/Time   CHOL 180 01/30/2015 1115   TRIG 75 01/30/2015 1115   HDL 55 01/30/2015 1115   CHOLHDL 3.3 01/30/2015 1115   VLDL 15 01/30/2015 1115   LDLCALC 110 01/30/2015 1115    ECG personally reviewed by me today- *** - No acute changes  Echocardiogram 10/14/2019 IMPRESSIONS     1. Left ventricular ejection fraction, by estimation, is 45 to 50%. The  left ventricle has mildly decreased function. The left ventricle  demonstrates regional wall motion abnormalities. Hypokinesis of the  inferoseptal wall, the basal to mid inferior  wall, and the basal  to mid inferolateral wall. There is mild left  ventricular hypertrophy. Left ventricular diastolic parameters are  consistent with Grade I diastolic dysfunction (impaired relaxation).   2. Right ventricular systolic function is mildly reduced. The right  ventricular size is normal. Tricuspid regurgitation signal is inadequate  for assessing PA pressure.   3. Left atrial size was mildly dilated.   4. Status post mitral valve repair. No more than mild mitral stenosis,  mean gradient 4 mmHg. No significant regurgitation.   5. The aortic valve is tricuspid. Aortic valve regurgitation is mild. No  aortic stenosis is present.   6. Aortic dilatation noted. There is mild dilatation of the aortic root,  measuring 40 mm.   7. The inferior vena cava is normal in size with greater than 50%  respiratory variability, suggesting right atrial pressure of 3 mmHg.   Comparison(s): 04/26/13 EF 50-55%. MV 35mmHg mean PG, 45mmHg peak PG.  Cardiac event monitor 08/13/2020  The dominant rhythm was normal sinus with somewhat blunted circadian rhythm variation and a tendency towards faster heart rates (average heart rate 93 bpm). There are no significant episodes of bradycardia. There is no evidence of atrial flutter or atrial fibrillation. There are rare premature ventricular beats, but these appear to be associated with the patient's complaints of palpitations. There are very rare and extremely brief episodes of nonsustained ventricular tachycardia (3-8 beats). There are frequent brief episodes of nonsustained supraventricular tachycardia with long RP mechanism (probably ectopic atrial tachycardia.). These occur both during daytime and nighttime hours without any evident pattern. Doing the patient's complaint of "flushed", "warm" or "weakness" the rhythm is mild sinus tachycardia   Abnormal event monitor due to the presence of frequent but brief episodes of nonsustained ectopic atrial tachycardia. PVCs are rare, but  appear to be symptomatic. Periods of weakness are associated with mild sinus tachycardia, suggesting possible hypotension with reactive sinus tachycardia.  Assessment & Plan   1.  Syncope-admitted to the hospital 06/30/2020 and discharged on 07/01/2020.  Diagnosed with hypotension as a result of doxazosin.  Denies further episodes of syncope.  Cardiac event monitor 08/13/2020 showed periods of weakness were associated with mild sinus tachycardia, rare PVCs were also noted but contributed to the symptomology.  Details above. Increase p.o. hydration Maintain increased caloric intake Lower extremity support stockings Avoid triggers for tachycardia caffeine, chocolate, EtOH, dehydration etc.  Paroxysmal atrial fibrillation-heart rate today ***105 bpm.  Cardiac event monitor showed predominantly normal sinus rhythm with no significant episodes of bradycardia, no atrial flutter or atrial fibrillation.    Underwent maze procedure in 2005 by Dr. Prescott Gum.  Reports compliance with warfarin, continues to deny  bleeding issues. Continue warfarin Heart healthy low-sodium diet-salty 6 given Increase physical activity as tolerated Order cardiac event monitor   OSA-continues to wait for CPAP delivery on back order.  Fatigue in the morning. Follows with Dr. [name]   Status post mitral valve repair-echocardiogram 9/21 showed no significant changes from previous exam.  No increased DOE or activity intolerance. No further diagnostics planned at this time.   Peripheral arterial disease-denies claudication.  Previous ABI showed incompressible calcified vessels in lower extremities. Continue omega-3 fatty acids Heart healthy low-sodium diet-salty 6 given Increase physical activity as tolerated   Essential tremor-previously discussed reluctance to prescribe beta-blockade therapy due to fatigue.  ***Upcoming MRI and adrenal work-up. Follows with neurology   Disposition: Follow-up with Dr. Sallyanne Kuster 4-6  months.   Jossie Ng. Khila Papp NP-C  08/22/2020, 7:00 AM [place] [address] Office [phone] Fax [phone]  Notice: This dictation was prepared with Dragon dictation along with smaller phrase technology. Any transcriptional errors that result from this process are unintentional and may not be corrected upon review.  I spent***minutes examining this patient, reviewing medications, and using patient centered shared decision making involving her cardiac care.  Prior to her visit I spent greater than 20 minutes reviewing her past medical history,  medications, and prior cardiac tests.

## 2020-08-24 ENCOUNTER — Ambulatory Visit: Payer: Medicare Other | Admitting: General Practice

## 2020-08-28 ENCOUNTER — Telehealth: Payer: Self-pay

## 2020-08-28 NOTE — Telephone Encounter (Signed)
LMOM FOR OVERDUE INR 

## 2020-08-31 DIAGNOSIS — I480 Paroxysmal atrial fibrillation: Secondary | ICD-10-CM | POA: Diagnosis not present

## 2020-08-31 DIAGNOSIS — K5900 Constipation, unspecified: Secondary | ICD-10-CM | POA: Diagnosis not present

## 2020-08-31 DIAGNOSIS — Z7901 Long term (current) use of anticoagulants: Secondary | ICD-10-CM | POA: Diagnosis not present

## 2020-08-31 DIAGNOSIS — R0689 Other abnormalities of breathing: Secondary | ICD-10-CM | POA: Diagnosis not present

## 2020-09-10 ENCOUNTER — Telehealth: Payer: Self-pay

## 2020-09-10 NOTE — Telephone Encounter (Signed)
Called pt and discussed overdue INR check, pt stated he was now having his INR checked and Warfarin managed by [place].   [place], and spoke with [place]. Confirmed that Warfarin started being managed by [facility] on July 14th, 2022.

## 2020-09-16 ENCOUNTER — Other Ambulatory Visit: Payer: Self-pay

## 2020-09-16 ENCOUNTER — Inpatient Hospital Stay (HOSPITAL_COMMUNITY)
Admission: EM | Admit: 2020-09-16 | Discharge: 2020-09-29 | DRG: 189 | Disposition: A | Discharge status: Other Acute Inpatient Hospital | Payer: Medicare Other | Attending: Internal Medicine | Admitting: Internal Medicine

## 2020-09-16 ENCOUNTER — Emergency Department (HOSPITAL_COMMUNITY): Payer: Medicare Other

## 2020-09-16 ENCOUNTER — Other Ambulatory Visit: Payer: Self-pay | Admitting: Cardiology

## 2020-09-16 DIAGNOSIS — J80 Acute respiratory distress syndrome: Secondary | ICD-10-CM | POA: Diagnosis not present

## 2020-09-16 DIAGNOSIS — Z8249 Family history of ischemic heart disease and other diseases of the circulatory system: Secondary | ICD-10-CM

## 2020-09-16 DIAGNOSIS — N210 Calculus in bladder: Secondary | ICD-10-CM | POA: Diagnosis not present

## 2020-09-16 DIAGNOSIS — E872 Acidosis: Secondary | ICD-10-CM | POA: Diagnosis not present

## 2020-09-16 DIAGNOSIS — I471 Supraventricular tachycardia, unspecified: Secondary | ICD-10-CM | POA: Diagnosis present

## 2020-09-16 DIAGNOSIS — I110 Hypertensive heart disease with heart failure: Secondary | ICD-10-CM | POA: Diagnosis present

## 2020-09-16 DIAGNOSIS — L89151 Pressure ulcer of sacral region, stage 1: Secondary | ICD-10-CM | POA: Diagnosis present

## 2020-09-16 DIAGNOSIS — J9601 Acute respiratory failure with hypoxia: Secondary | ICD-10-CM

## 2020-09-16 DIAGNOSIS — G9341 Metabolic encephalopathy: Secondary | ICD-10-CM | POA: Diagnosis not present

## 2020-09-16 DIAGNOSIS — R627 Adult failure to thrive: Secondary | ICD-10-CM | POA: Diagnosis not present

## 2020-09-16 DIAGNOSIS — Z808 Family history of malignant neoplasm of other organs or systems: Secondary | ICD-10-CM

## 2020-09-16 DIAGNOSIS — J9602 Acute respiratory failure with hypercapnia: Secondary | ICD-10-CM | POA: Diagnosis not present

## 2020-09-16 DIAGNOSIS — I480 Paroxysmal atrial fibrillation: Secondary | ICD-10-CM | POA: Diagnosis present

## 2020-09-16 DIAGNOSIS — R64 Cachexia: Secondary | ICD-10-CM | POA: Diagnosis present

## 2020-09-16 DIAGNOSIS — Z9119 Patient's noncompliance with other medical treatment and regimen: Secondary | ICD-10-CM

## 2020-09-16 DIAGNOSIS — E43 Unspecified severe protein-calorie malnutrition: Secondary | ICD-10-CM | POA: Diagnosis not present

## 2020-09-16 DIAGNOSIS — R0602 Shortness of breath: Secondary | ICD-10-CM | POA: Diagnosis not present

## 2020-09-16 DIAGNOSIS — I959 Hypotension, unspecified: Secondary | ICD-10-CM | POA: Diagnosis present

## 2020-09-16 DIAGNOSIS — E861 Hypovolemia: Secondary | ICD-10-CM | POA: Diagnosis present

## 2020-09-16 DIAGNOSIS — E860 Dehydration: Secondary | ICD-10-CM | POA: Diagnosis present

## 2020-09-16 DIAGNOSIS — K5900 Constipation, unspecified: Secondary | ICD-10-CM | POA: Diagnosis not present

## 2020-09-16 DIAGNOSIS — R42 Dizziness and giddiness: Secondary | ICD-10-CM | POA: Diagnosis not present

## 2020-09-16 DIAGNOSIS — R432 Parageusia: Secondary | ICD-10-CM | POA: Diagnosis present

## 2020-09-16 DIAGNOSIS — R634 Abnormal weight loss: Secondary | ICD-10-CM | POA: Diagnosis not present

## 2020-09-16 DIAGNOSIS — Z7901 Long term (current) use of anticoagulants: Secondary | ICD-10-CM

## 2020-09-16 DIAGNOSIS — J9621 Acute and chronic respiratory failure with hypoxia: Principal | ICD-10-CM | POA: Diagnosis present

## 2020-09-16 DIAGNOSIS — Z20822 Contact with and (suspected) exposure to covid-19: Secondary | ICD-10-CM | POA: Diagnosis not present

## 2020-09-16 DIAGNOSIS — R791 Abnormal coagulation profile: Secondary | ICD-10-CM

## 2020-09-16 DIAGNOSIS — Z681 Body mass index (BMI) 19 or less, adult: Secondary | ICD-10-CM | POA: Diagnosis not present

## 2020-09-16 DIAGNOSIS — J449 Chronic obstructive pulmonary disease, unspecified: Secondary | ICD-10-CM | POA: Diagnosis not present

## 2020-09-16 DIAGNOSIS — R0689 Other abnormalities of breathing: Secondary | ICD-10-CM | POA: Diagnosis not present

## 2020-09-16 DIAGNOSIS — I5043 Acute on chronic combined systolic (congestive) and diastolic (congestive) heart failure: Secondary | ICD-10-CM | POA: Diagnosis not present

## 2020-09-16 DIAGNOSIS — J9622 Acute and chronic respiratory failure with hypercapnia: Secondary | ICD-10-CM | POA: Diagnosis not present

## 2020-09-16 DIAGNOSIS — R0902 Hypoxemia: Secondary | ICD-10-CM

## 2020-09-16 DIAGNOSIS — G250 Essential tremor: Secondary | ICD-10-CM | POA: Diagnosis present

## 2020-09-16 DIAGNOSIS — I9589 Other hypotension: Secondary | ICD-10-CM | POA: Diagnosis not present

## 2020-09-16 DIAGNOSIS — X58XXXA Exposure to other specified factors, initial encounter: Secondary | ICD-10-CM | POA: Diagnosis present

## 2020-09-16 DIAGNOSIS — E873 Alkalosis: Secondary | ICD-10-CM | POA: Diagnosis not present

## 2020-09-16 DIAGNOSIS — Z515 Encounter for palliative care: Secondary | ICD-10-CM | POA: Diagnosis not present

## 2020-09-16 DIAGNOSIS — E785 Hyperlipidemia, unspecified: Secondary | ICD-10-CM | POA: Diagnosis present

## 2020-09-16 DIAGNOSIS — G4733 Obstructive sleep apnea (adult) (pediatric): Secondary | ICD-10-CM | POA: Diagnosis present

## 2020-09-16 DIAGNOSIS — I2510 Atherosclerotic heart disease of native coronary artery without angina pectoris: Secondary | ICD-10-CM | POA: Diagnosis present

## 2020-09-16 DIAGNOSIS — T730XXA Starvation, initial encounter: Secondary | ICD-10-CM | POA: Diagnosis present

## 2020-09-16 DIAGNOSIS — K219 Gastro-esophageal reflux disease without esophagitis: Secondary | ICD-10-CM | POA: Diagnosis present

## 2020-09-16 DIAGNOSIS — L98429 Non-pressure chronic ulcer of back with unspecified severity: Secondary | ICD-10-CM

## 2020-09-16 DIAGNOSIS — I10 Essential (primary) hypertension: Secondary | ICD-10-CM | POA: Diagnosis not present

## 2020-09-16 DIAGNOSIS — J9690 Respiratory failure, unspecified, unspecified whether with hypoxia or hypercapnia: Secondary | ICD-10-CM | POA: Diagnosis not present

## 2020-09-16 DIAGNOSIS — I6782 Cerebral ischemia: Secondary | ICD-10-CM | POA: Diagnosis not present

## 2020-09-16 DIAGNOSIS — K7689 Other specified diseases of liver: Secondary | ICD-10-CM | POA: Diagnosis not present

## 2020-09-16 DIAGNOSIS — F40240 Claustrophobia: Secondary | ICD-10-CM | POA: Diagnosis present

## 2020-09-16 DIAGNOSIS — Z9889 Other specified postprocedural states: Secondary | ICD-10-CM

## 2020-09-16 DIAGNOSIS — I700 Atherosclerosis of aorta: Secondary | ICD-10-CM | POA: Diagnosis not present

## 2020-09-16 DIAGNOSIS — I5032 Chronic diastolic (congestive) heart failure: Secondary | ICD-10-CM | POA: Diagnosis not present

## 2020-09-16 DIAGNOSIS — G319 Degenerative disease of nervous system, unspecified: Secondary | ICD-10-CM | POA: Diagnosis not present

## 2020-09-16 DIAGNOSIS — J90 Pleural effusion, not elsewhere classified: Secondary | ICD-10-CM | POA: Diagnosis not present

## 2020-09-16 DIAGNOSIS — J9620 Acute and chronic respiratory failure, unspecified whether with hypoxia or hypercapnia: Secondary | ICD-10-CM | POA: Diagnosis not present

## 2020-09-16 DIAGNOSIS — K5641 Fecal impaction: Secondary | ICD-10-CM | POA: Diagnosis not present

## 2020-09-16 DIAGNOSIS — H1131 Conjunctival hemorrhage, right eye: Secondary | ICD-10-CM | POA: Diagnosis present

## 2020-09-16 DIAGNOSIS — F419 Anxiety disorder, unspecified: Secondary | ICD-10-CM | POA: Diagnosis present

## 2020-09-16 DIAGNOSIS — H532 Diplopia: Secondary | ICD-10-CM | POA: Diagnosis present

## 2020-09-16 DIAGNOSIS — R000 Tachycardia, unspecified: Secondary | ICD-10-CM | POA: Diagnosis not present

## 2020-09-16 LAB — CBC WITH DIFFERENTIAL/PLATELET
Abs Immature Granulocytes: 0.03 10*3/uL (ref 0.00–0.07)
Basophils Absolute: 0 10*3/uL (ref 0.0–0.1)
Basophils Relative: 0 %
Eosinophils Absolute: 0 10*3/uL (ref 0.0–0.5)
Eosinophils Relative: 0 %
HCT: 55.1 % — ABNORMAL HIGH (ref 39.0–52.0)
Hemoglobin: 17.8 g/dL — ABNORMAL HIGH (ref 13.0–17.0)
Immature Granulocytes: 0 %
Lymphocytes Relative: 9 %
Lymphs Abs: 0.8 10*3/uL (ref 0.7–4.0)
MCH: 31.7 pg (ref 26.0–34.0)
MCHC: 32.3 g/dL (ref 30.0–36.0)
MCV: 98.2 fL (ref 80.0–100.0)
Monocytes Absolute: 0.6 10*3/uL (ref 0.1–1.0)
Monocytes Relative: 7 %
Neutro Abs: 7 10*3/uL (ref 1.7–7.7)
Neutrophils Relative %: 84 %
Platelets: 166 10*3/uL (ref 150–400)
RBC: 5.61 MIL/uL (ref 4.22–5.81)
RDW: 13.6 % (ref 11.5–15.5)
WBC: 8.5 10*3/uL (ref 4.0–10.5)
nRBC: 0 % (ref 0.0–0.2)

## 2020-09-16 LAB — BLOOD GAS, VENOUS
Acid-Base Excess: 4.5 mmol/L — ABNORMAL HIGH (ref 0.0–2.0)
Bicarbonate: 36.4 mmol/L — ABNORMAL HIGH (ref 20.0–28.0)
O2 Saturation: 63.4 %
Patient temperature: 98.6
pCO2, Ven: 90.8 mmHg (ref 44.0–60.0)
pH, Ven: 7.227 — ABNORMAL LOW (ref 7.250–7.430)
pO2, Ven: 41.7 mmHg (ref 32.0–45.0)

## 2020-09-16 LAB — COMPREHENSIVE METABOLIC PANEL
ALT: 43 U/L (ref 0–44)
AST: 34 U/L (ref 15–41)
Albumin: 4.4 g/dL (ref 3.5–5.0)
Alkaline Phosphatase: 46 U/L (ref 38–126)
Anion gap: 13 (ref 5–15)
BUN: 34 mg/dL — ABNORMAL HIGH (ref 8–23)
CO2: 36 mmol/L — ABNORMAL HIGH (ref 22–32)
Calcium: 9.4 mg/dL (ref 8.9–10.3)
Chloride: 91 mmol/L — ABNORMAL LOW (ref 98–111)
Creatinine, Ser: 0.8 mg/dL (ref 0.61–1.24)
GFR, Estimated: >60 mL/min (ref >60)
Glucose, Bld: 123 mg/dL — ABNORMAL HIGH (ref 70–99)
Potassium: 4.5 mmol/L (ref 3.5–5.1)
Sodium: 140 mmol/L (ref 135–145)
Total Bilirubin: 1.4 mg/dL — ABNORMAL HIGH (ref 0.3–1.2)
Total Protein: 6.7 g/dL (ref 6.5–8.1)

## 2020-09-16 LAB — BLOOD GAS, ARTERIAL
Acid-Base Excess: 6.9 mmol/L — ABNORMAL HIGH (ref 0.0–2.0)
Acid-Base Excess: 5.2 mmol/L — ABNORMAL HIGH (ref 0.0–2.0)
Bicarbonate: 32.7 mmol/L — ABNORMAL HIGH (ref 20.0–28.0)
Bicarbonate: 33.8 mmol/L — ABNORMAL HIGH (ref 20.0–28.0)
O2 Saturation: 98.9 %
O2 Saturation: 99.1 %
Patient temperature: 98.6
Patient temperature: 98.6
pCO2 arterial: 51.3 mmHg — ABNORMAL HIGH (ref 32.0–48.0)
pCO2 arterial: 69 mmHg (ref 32.0–48.0)
pH, Arterial: 7.42 (ref 7.350–7.450)
pH, Arterial: 7.311 — ABNORMAL LOW (ref 7.350–7.450)
pO2, Arterial: 123 mmHg — ABNORMAL HIGH (ref 83.0–108.0)
pO2, Arterial: 258 mmHg — ABNORMAL HIGH (ref 83.0–108.0)

## 2020-09-16 LAB — D-DIMER, QUANTITATIVE: D-Dimer, Quant: <0.27 ug/mL-FEU (ref 0.00–0.50)

## 2020-09-16 LAB — TSH: TSH: 1.572 u[IU]/mL (ref 0.350–4.500)

## 2020-09-16 LAB — RESP PANEL BY RT-PCR (FLU A&B, COVID) ARPGX2
Influenza A by PCR: NEGATIVE
Influenza B by PCR: NEGATIVE
SARS Coronavirus 2 by RT PCR: NEGATIVE

## 2020-09-16 LAB — PROTIME-INR
INR: 10.9 (ref 0.8–1.2)
Prothrombin Time: 85.1 seconds — ABNORMAL HIGH (ref 11.4–15.2)

## 2020-09-16 LAB — T4, FREE: Free T4: 0.89 ng/dL (ref 0.61–1.12)

## 2020-09-16 LAB — MRSA NEXT GEN BY PCR, NASAL: MRSA by PCR Next Gen: NOT DETECTED

## 2020-09-16 LAB — TROPONIN I (HIGH SENSITIVITY)
Troponin I (High Sensitivity): 6 ng/L (ref <18)
Troponin I (High Sensitivity): 6 ng/L (ref <18)

## 2020-09-16 LAB — LIPASE, BLOOD: Lipase: 36 U/L (ref 11–51)

## 2020-09-16 IMAGING — CR DG CHEST 2V
2 series · 2 of 2 positions shown · non-contrast
Comparison: [DATE]

CLINICAL DATA: sob

EXAM:
CHEST - 2 VIEW

[w chest lat]
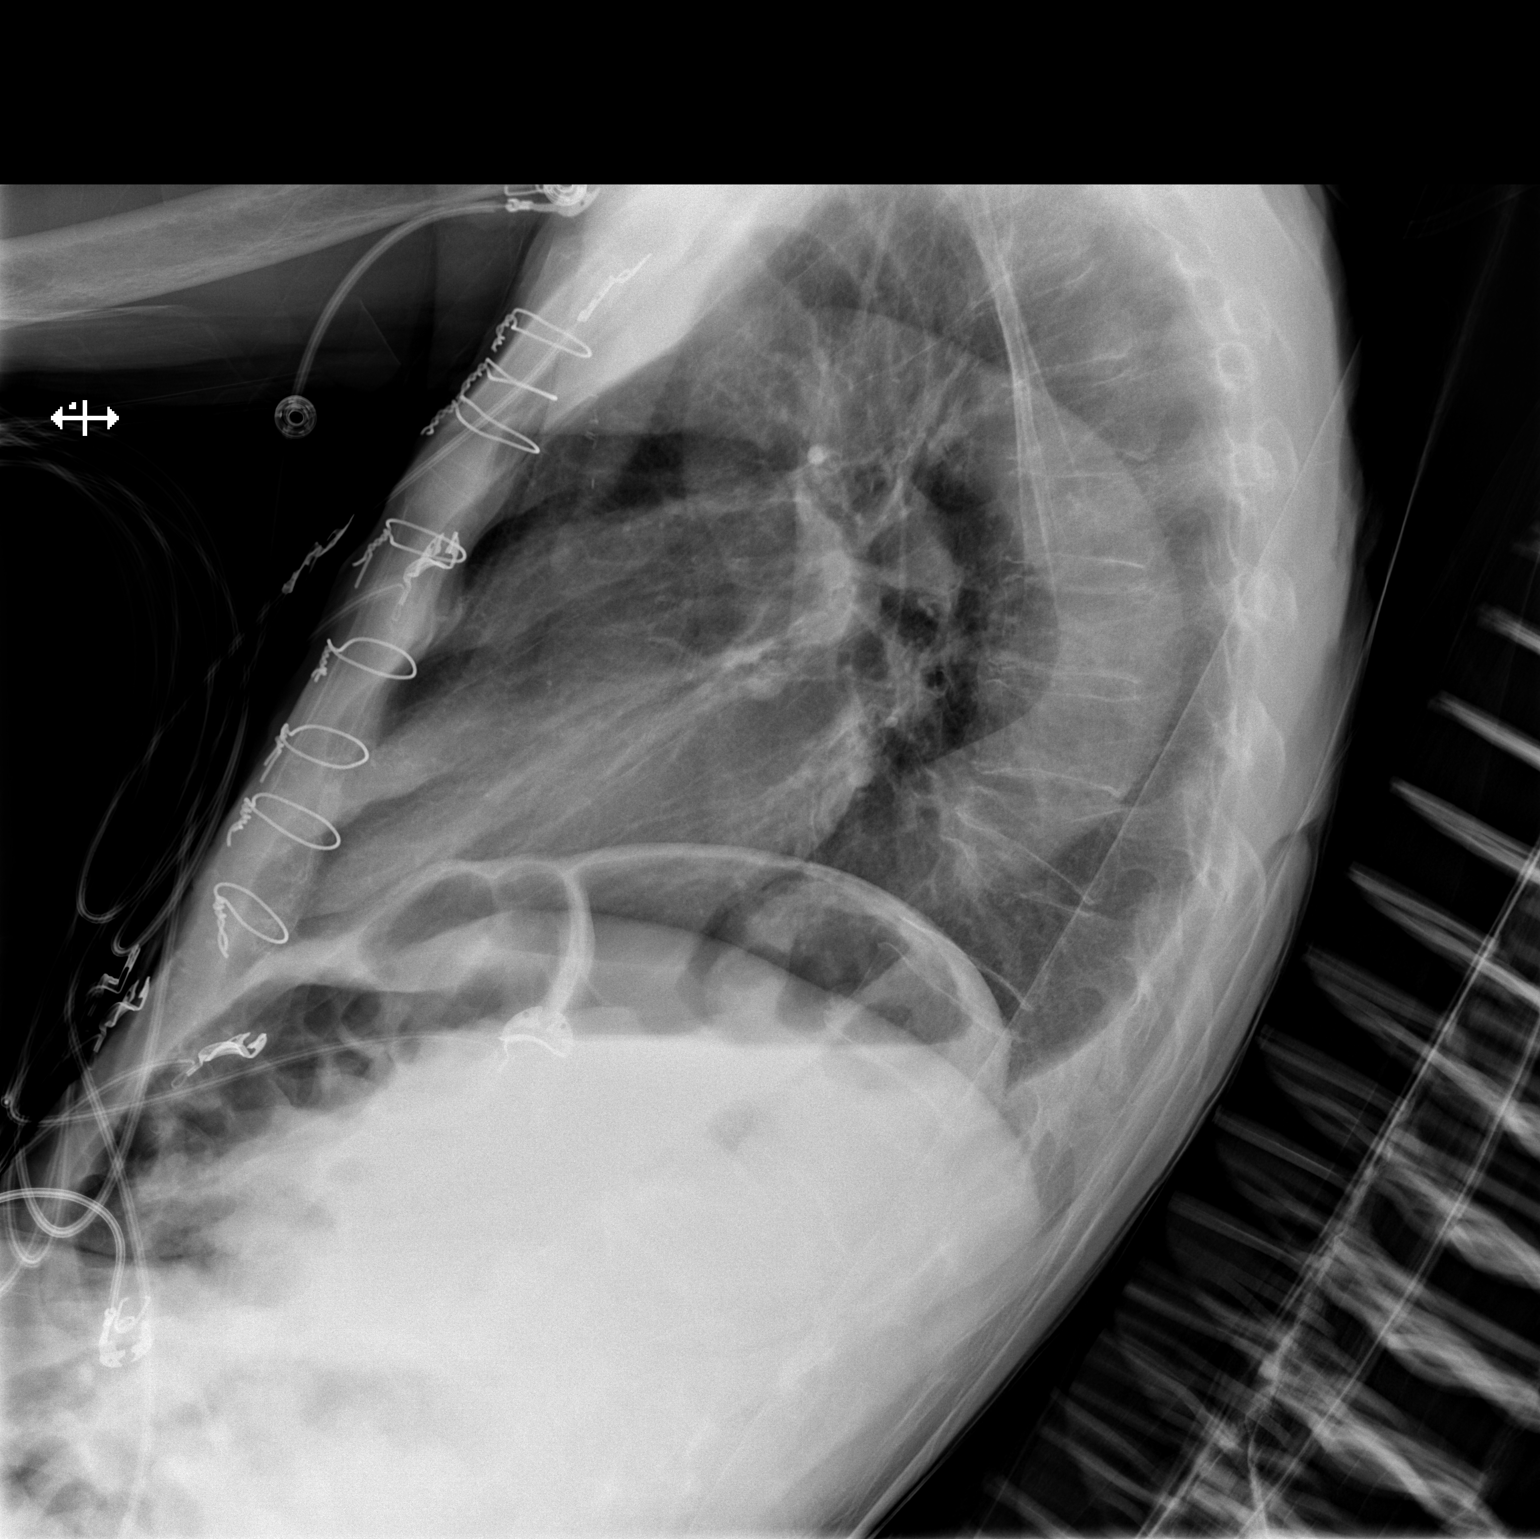

[x chest ap]
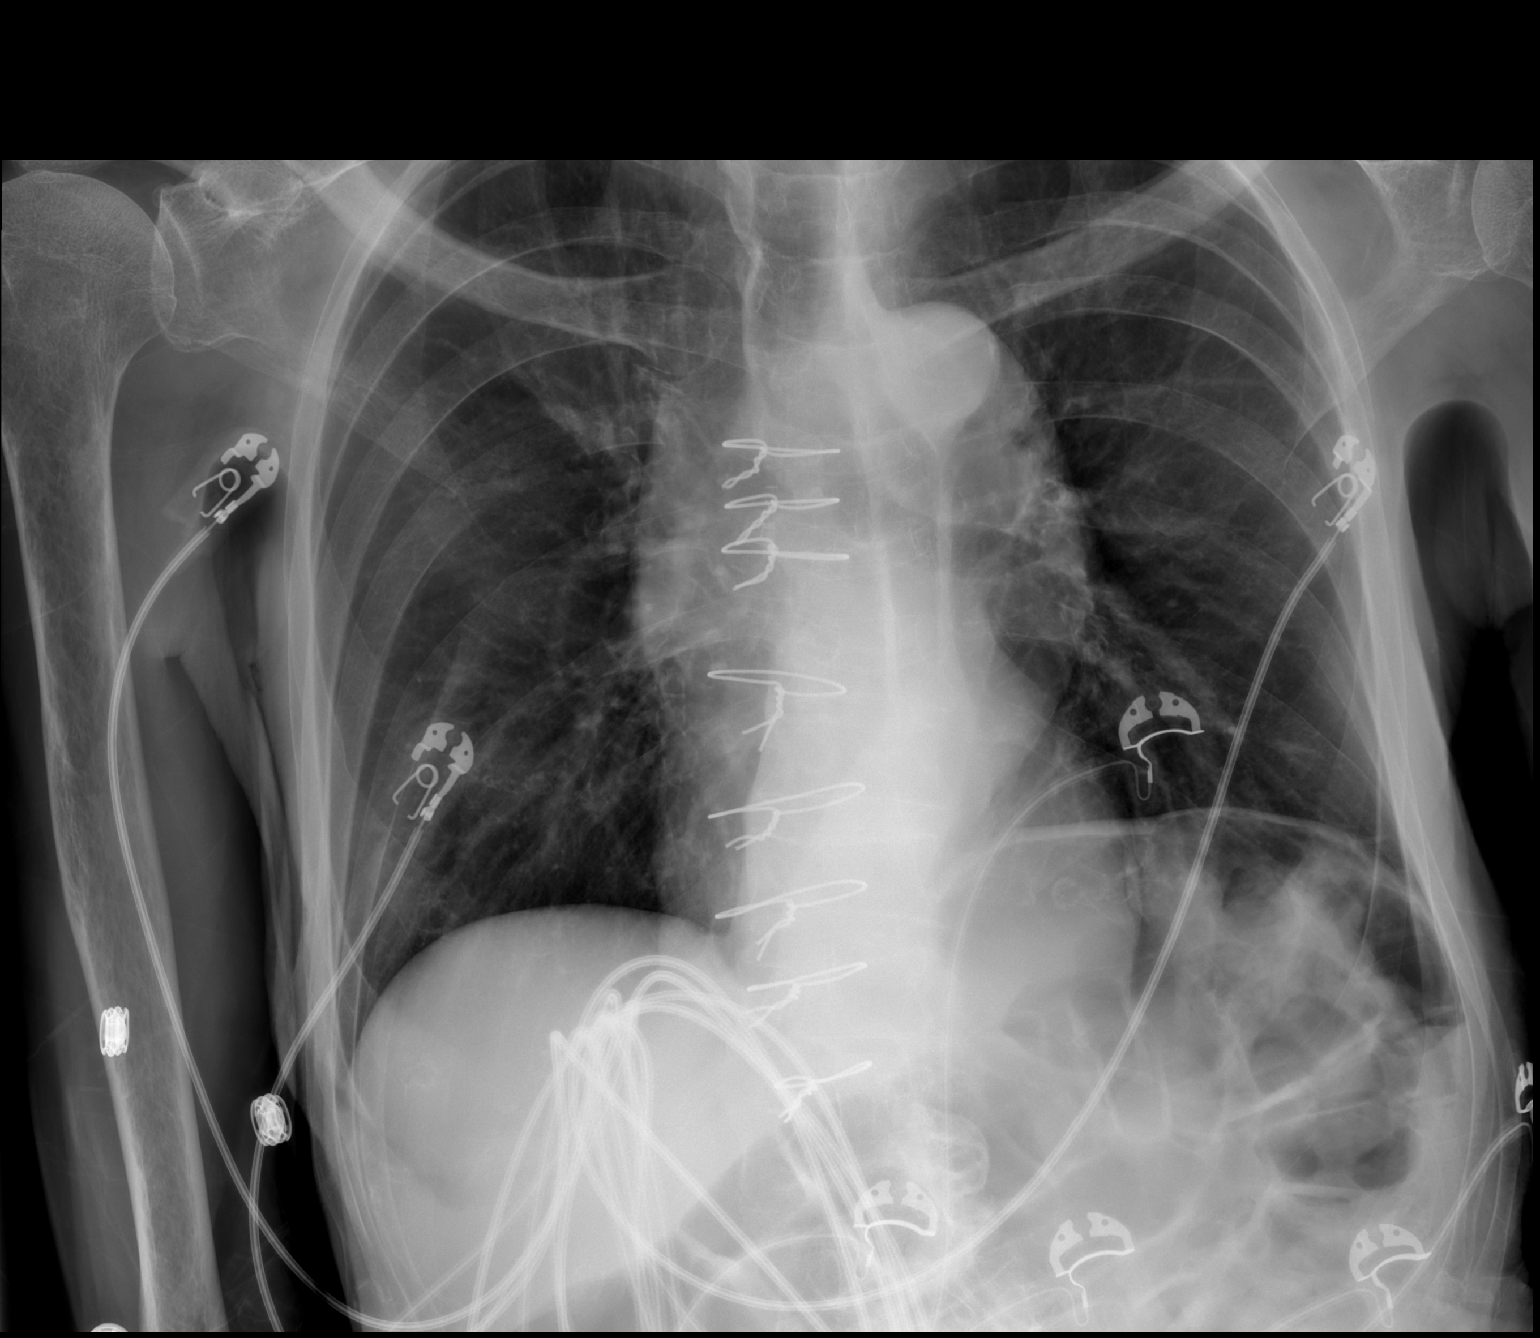

[2 of 2 positions shown; findings below may reference images not displayed]

FINDINGS: The cardiomediastinal silhouette is unchanged in contour.Tortuous
thoracic aorta. Status post median sternotomy. No pleural effusion.
No pneumothorax. No acute pleuroparenchymal abnormality. Visualized
abdomen is unremarkable. Compression fracture deformity of the mid
to inferior thoracic spine, unchanged.
IMPRESSION: No acute cardiopulmonary abnormality.

## 2020-09-16 MED ORDER — ALPRAZOLAM 0.25 MG PO TABS
0.1250 mg | ORAL_TABLET | Freq: Every evening | ORAL | Status: DC | PRN
  Administered 2020-09-16 – 2020-09-28 (×6): 0.125 mg via ORAL
  Filled 2020-09-16 (×7): qty 1

## 2020-09-16 MED ORDER — MAGNESIUM SULFATE 2 GM/50ML IV SOLN
2.0000 g | Freq: Once | INTRAVENOUS | Status: AC
  Administered 2020-09-16: 2 g via INTRAVENOUS
  Filled 2020-09-16: qty 50

## 2020-09-16 MED ORDER — ACETAMINOPHEN 500 MG PO TABS
500.0000 mg | ORAL_TABLET | Freq: Four times a day (QID) | ORAL | Status: DC | PRN
  Administered 2020-09-17 – 2020-09-29 (×15): 500 mg via ORAL
  Filled 2020-09-16 (×15): qty 1

## 2020-09-16 MED ORDER — BOOST / RESOURCE BREEZE PO LIQD CUSTOM
1.0000 | Freq: Three times a day (TID) | ORAL | Status: DC
  Administered 2020-09-17: 1 via ORAL
  Filled 2020-09-16: qty 1

## 2020-09-16 MED ORDER — ALPRAZOLAM 0.5 MG PO TABS: 0.5000 mg | ORAL_TABLET | Freq: Once | ORAL | Status: DC

## 2020-09-16 MED ORDER — SODIUM CHLORIDE 0.9 % IV SOLN: INTRAVENOUS | Status: DC

## 2020-09-16 MED ORDER — CHLORHEXIDINE GLUCONATE CLOTH 2 % EX PADS
6.0000 | MEDICATED_PAD | Freq: Every day | CUTANEOUS | Status: DC
  Administered 2020-09-17 – 2020-09-26 (×10): 6 via TOPICAL

## 2020-09-16 MED ORDER — OMEGA-3-ACID ETHYL ESTERS 1 G PO CAPS
1.0000 g | ORAL_CAPSULE | Freq: Every day | ORAL | Status: DC
  Administered 2020-09-17 – 2020-09-29 (×12): 1 g via ORAL
  Filled 2020-09-16 (×12): qty 1

## 2020-09-16 MED ORDER — IPRATROPIUM-ALBUTEROL 0.5-2.5 (3) MG/3ML IN SOLN
3.0000 mL | Freq: Once | RESPIRATORY_TRACT | Status: AC
  Administered 2020-09-16: 3 mL via RESPIRATORY_TRACT
  Filled 2020-09-16: qty 3

## 2020-09-16 MED ORDER — ALBUTEROL SULFATE HFA 108 (90 BASE) MCG/ACT IN AERS: 2.0000 | INHALATION_SPRAY | RESPIRATORY_TRACT | Status: DC | PRN

## 2020-09-16 MED ORDER — PHYTONADIONE 5 MG PO TABS
2.5000 mg | ORAL_TABLET | Freq: Once | ORAL | Status: AC
  Administered 2020-09-16: 2.5 mg via ORAL
  Filled 2020-09-16: qty 1

## 2020-09-16 MED ORDER — SODIUM CHLORIDE 0.9 % IV BOLUS: 1000.0000 mL | Freq: Once | INTRAVENOUS | Status: DC

## 2020-09-16 MED ORDER — MAGNESIUM 250 MG PO TABS: 250.0000 mg | ORAL_TABLET | Freq: Every day | ORAL | Status: DC

## 2020-09-16 MED ORDER — ALBUTEROL SULFATE (2.5 MG/3ML) 0.083% IN NEBU: 2.5000 mg | INHALATION_SOLUTION | RESPIRATORY_TRACT | Status: DC | PRN

## 2020-09-16 MED ORDER — VITAMIN D3 25 MCG (1000 UNIT) PO TABS
2000.0000 [IU] | ORAL_TABLET | Freq: Every day | ORAL | Status: DC
  Administered 2020-09-17 – 2020-09-29 (×12): 2000 [IU] via ORAL
  Filled 2020-09-16 (×12): qty 2

## 2020-09-16 MED ORDER — SODIUM CHLORIDE 0.9 % IV BOLUS
500.0000 mL | Freq: Once | INTRAVENOUS | Status: AC
  Administered 2020-09-16: 500 mL via INTRAVENOUS

## 2020-09-16 MED ORDER — IPRATROPIUM-ALBUTEROL 0.5-2.5 (3) MG/3ML IN SOLN
3.0000 mL | RESPIRATORY_TRACT | Status: AC
  Administered 2020-09-16 – 2020-09-17 (×6): 3 mL via RESPIRATORY_TRACT
  Filled 2020-09-16 (×6): qty 3

## 2020-09-16 MED ORDER — ASCORBIC ACID 500 MG PO TABS
500.0000 mg | ORAL_TABLET | Freq: Every day | ORAL | Status: DC
  Administered 2020-09-17 – 2020-09-29 (×12): 500 mg via ORAL
  Filled 2020-09-16 (×12): qty 1

## 2020-09-16 MED ORDER — MAGNESIUM OXIDE -MG SUPPLEMENT 400 (240 MG) MG PO TABS: 200.0000 mg | ORAL_TABLET | Freq: Every day | ORAL | Status: DC

## 2020-09-16 NOTE — ED Triage Notes (Signed)
Pt presents to ED via ems cc sob. Per ems, pt was given albuterol breathing tx enroute. Per ems, pt was c/o sob and RA O2 was 88%. Pt states having sob for the past several months and has appointment with pulmonologist upcoming, but no dx yet for sob.

## 2020-09-16 NOTE — ED Provider Notes (Signed)
[facility] DEPT Provider Note   CSN: [id number] Arrival date & time: 09/16/20  1433     History Chief Complaint  Patient presents with   Shortness of [place] is a 80 y.o. male.  This is 80 year old male with history as below presents to the ER secondary to dyspnea, fatigue, malaise.  Patient reports generalized weakness worsened over the past couple weeks.  Patient has been coughing but has been nonproductive.  Endorses some chills over the past few days, no fevers or diaphoresis.  No chest pain.  Mild dyspnea, no home oxygen use. Does have CPAP which he doesn't use often 2/2 discomfort.   Reports good compliance with home warfarin, coagulated secondary to mitral valve repair.  No falls or evidence of bleeding reported. Patient also reports that he has not been eating over the past couple weeks.  No appetite.  Patient reports loss of significant amount of weight over the past few months.  Poor oral intake. This has been documented in previous visits patient with history of malnutrition.  The history is provided by the patient. No language interpreter was used.  Shortness of Breath Associated symptoms: cough   Associated symptoms: no abdominal pain, no chest pain, no fever, no headaches, no rash and no vomiting       Past Medical History:  Diagnosis Date   CHF (congestive heart failure) ([place])    Coronary artery disease    GI bleed 2019   hospitalized at [facility] for one week   Headache    since childhood   Low blood sugar    since childhood, controlled by diet   Mitral regurgitation    Mitral valve prolapse    Osteopenia 2021   Paroxysmal atrial fibrillation [facility])     Patient Active Problem List   Diagnosis Date Noted   Acute on chronic respiratory failure with hypoxia and hypercapnia ([place]) 09/16/2020   Failure to thrive in adult 09/16/2020   AF (paroxysmal atrial fibrillation) ([place]) 09/16/2020   Stage 1 skin ulcer of  sacral region ([place]) 09/16/2020   Hypotension 06/30/2020   GIB (gastrointestinal bleeding) 02/17/2017   Rectal bleeding 02/16/2017   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   Essential tremor 02/19/2015   Status post mitral valve annuloplasty and MAZE 2005 12/12/2014   HLD (hyperlipidemia) 05/05/2013   DOE (dyspnea on exertion) 04/22/2013   Fatigue 04/22/2013   Atrial fibrillation ([place]) 05/08/2012    Past Surgical History:  Procedure Laterality Date   COLONOSCOPY WITH PROPOFOL N/A 02/19/2017   Procedure: COLONOSCOPY WITH PROPOFOL;  Surgeon: Wilford Corner, MD;  Location: [facility] ENDOSCOPY;  Service: Endoscopy;  Laterality: N/A;   laser eye surgery for retina detachment     MITRAL VALVE REPAIR  01/2003   monitor  02/05/2006   polyp removal     TONSILLECTOMY     tooth removal     as a teenager       Family History  Problem Relation Age of Onset   Heart disease Mother    CAD Mother    Migraines Mother    Heart failure Father    Skin cancer Father    Valvular heart disease Father        prolapsed valve   Tremor Father        "probably had a bit of tremor in his old age"   Heart failure Maternal Grandmother    Pneumonia Maternal Grandfather    Heart failure  Paternal Grandmother    Stroke Paternal Grandfather    Asthma Daughter    Epilepsy Son    Parkinson's disease Neg Hx     Social History   Tobacco Use   Smoking status: Never   Smokeless tobacco: Never   Tobacco comments:    only tried a few cigarettes when he was younger  Substance Use Topics   Alcohol use: Yes    Comment: "probably a 6 pack of beer a year"   Drug use: No    Home Medications Prior to Admission medications   Medication Sig Start Date End Date Taking? Authorizing Provider  acetaminophen (TYLENOL) 500 MG tablet Take 500 mg by mouth every 6 (six) hours as needed for mild pain or headache.   Yes [provider]  ALPRAZolam (XANAX) 0.25 MG tablet Take 0.125 mg by mouth at  bedtime as needed for anxiety.   Yes [provider]  Ascorbic Acid (VITAMIN C IMMUNE HEALTH PO) Take 500 mg by mouth.   Yes [provider]  B Complex Vitamins (VITAMIN B COMPLEX PO) Take 1 tablet by mouth daily.   Yes [provider]  Cholecalciferol (D3) 50 MCG (2000 UT) TABS Take 2,000 Units by mouth daily.   Yes [provider]  diphenhydramine-acetaminophen (TYLENOL PM) 25-500 MG TABS tablet Take 1 tablet by mouth at bedtime as needed.   Yes [provider]  Garlic 277 MG TABS Take 100 mg by mouth daily.   Yes [provider]  Magnesium 250 MG TABS Take 250 mg by mouth daily.   Yes [provider]  Omega-3 Fatty Acids (FISH OIL PO) Take 1 capsule by mouth daily.   Yes [provider]  warfarin (COUMADIN) 1 MG tablet Take 1 mg by mouth daily.   Yes [provider]  primidone (MYSOLINE) 50 MG tablet Start with 1/2 pill at bedtime(25mg ). If no side effects in 2 weeks, may increase to a whole tablet(50mg ) at bedtime otherwise continue with 1/2 pill(25mg ) Patient not taking: No sig reported 07/25/20   Melvenia Beam, MD  warfarin (COUMADIN) 5 MG tablet Take 1 tablet (5 mg total) by mouth daily. Patient not taking: No sig reported 07/01/20   Oswald Hillock, MD    Allergies    Prednisone and Cortisone  Review of Systems   Review of Systems  Constitutional:  Positive for appetite change and fatigue. Negative for chills and fever.  HENT:  Negative for facial swelling and trouble swallowing.   Eyes:  Negative for photophobia and visual disturbance.  Respiratory:  Positive for cough and shortness of breath.   Cardiovascular:  Negative for chest pain and palpitations.  Gastrointestinal:  Negative for abdominal pain, nausea and vomiting.  Endocrine: Negative for polydipsia and polyuria.  Genitourinary:  Negative for difficulty urinating and hematuria.  Musculoskeletal:  Negative for gait problem and joint swelling.   Skin:  Negative for pallor and rash.  Neurological:  Negative for syncope and headaches.  Psychiatric/Behavioral:  Negative for agitation and confusion.    Physical Exam Updated Vital Signs BP (!) 86/58   Pulse 79   Temp 98.5 F (36.9 C) (Axillary)   Resp 17   Ht 5\' 7"  (1.702 m)   Wt 45.4 kg   SpO2 100%   BMI 15.66 kg/m   Physical Exam Vitals and nursing note reviewed.  Constitutional:      General: He is not in acute distress.    Appearance: He is well-developed.  Comments: Cachectic appearing  HENT:     Head: Normocephalic and atraumatic.     Right Ear: External ear normal.     Left Ear: External ear normal.     Mouth/Throat:     Mouth: Mucous membranes are moist.  Eyes:     General: No scleral icterus.    Extraocular Movements: Extraocular movements intact.     Pupils: Pupils are equal, round, and reactive to light.     Comments: Subconjunctival hemorrhage to right eye, visual fields intact.  No pain with eye movement.  Vision grossly intact  Cardiovascular:     Rate and Rhythm: Regular rhythm. Tachycardia present.     Pulses: Normal pulses.     Heart sounds: Normal heart sounds.  Pulmonary:     Effort: Pulmonary effort is normal. Tachypnea present. No respiratory distress.     Breath sounds: Decreased air movement present. Decreased breath sounds present.  Abdominal:     General: Abdomen is flat.     Palpations: Abdomen is soft.     Tenderness: There is no abdominal tenderness.  Musculoskeletal:        General: Normal range of motion.     Cervical back: Normal range of motion.     Right lower leg: No edema.     Left lower leg: No edema.  Skin:    General: Skin is warm and dry.     Capillary Refill: Capillary refill takes less than 2 seconds.  Neurological:     Mental Status: He is alert and oriented to person, place, and time.     GCS: GCS eye subscore is 4. GCS verbal subscore is 5. GCS motor subscore is 6.     Cranial Nerves: Cranial nerves are  intact.     Sensory: Sensation is intact.     Motor: Motor function is intact.     Coordination: Coordination is intact.  Psychiatric:        Mood and Affect: Mood normal.        Behavior: Behavior normal.    ED Results / Procedures / Treatments   Labs (all labs ordered are listed, but only abnormal results are displayed) Labs Reviewed  CBC WITH DIFFERENTIAL/PLATELET - Abnormal; Notable for the following components:      Result Value   Hemoglobin 17.8 (*)    HCT 55.1 (*)    All other components within normal limits  COMPREHENSIVE METABOLIC PANEL - Abnormal; Notable for the following components:   Chloride 91 (*)    CO2 36 (*)    Glucose, Bld 123 (*)    BUN 34 (*)    Total Bilirubin 1.4 (*)    All other components within normal limits  PROTIME-INR - Abnormal; Notable for the following components:   Prothrombin Time 85.1 (*)    INR 10.9 (*)    All other components within normal limits  BLOOD GAS, VENOUS - Abnormal; Notable for the following components:   pH, Ven 7.227 (*)    pCO2, Ven 90.8 (*)    Bicarbonate 36.4 (*)    Acid-Base Excess 4.5 (*)    All other components within normal limits  BLOOD GAS, ARTERIAL - Abnormal; Notable for the following components:   pCO2 arterial 51.3 (*)    pO2, Arterial 123 (*)    Bicarbonate 32.7 (*)    Acid-Base Excess 6.9 (*)    All other components within normal limits  BLOOD GAS, ARTERIAL - Abnormal; Notable for the following components:   pH,  Arterial 7.311 (*)    pCO2 arterial 69.0 (*)    pO2, Arterial 258 (*)    Bicarbonate 33.8 (*)    Acid-Base Excess 5.2 (*)    All other components within normal limits  RESP PANEL BY RT-PCR (FLU A&B, COVID) ARPGX2  MRSA NEXT GEN BY PCR, NASAL  TSH  T4, FREE  D-DIMER, QUANTITATIVE  LIPASE, BLOOD  URINALYSIS, ROUTINE W REFLEX MICROSCOPIC  CBC  BASIC METABOLIC PANEL  PROTIME-INR  HIV ANTIBODY (ROUTINE TESTING W REFLEX)  RPR  TROPONIN I (HIGH SENSITIVITY)  TROPONIN I (HIGH SENSITIVITY)     EKG EKG Interpretation  Date/Time:  Sunday September 16 2020 14:44:37 EDT Ventricular Rate:  122 PR Interval:  162 QRS Duration: 97 QT Interval:  306 QTC Calculation: 436 R Axis:   76 Text Interpretation: Sinus tachycardia Nonspecific T abnormalities, inferior leads Similar to prior 09/2003 Confirmed by Wynona Dove ([id number]) on 09/16/2020 3:12:50 PM  Radiology DG Chest 2 View  Result Date: 09/16/2020 CLINICAL DATA:  sob EXAM: CHEST - 2 VIEW COMPARISON:  June 30, 2020 FINDINGS: The cardiomediastinal silhouette is unchanged in contour.Tortuous thoracic aorta. Status post median sternotomy. No pleural effusion. No pneumothorax. No acute pleuroparenchymal abnormality. Visualized abdomen is unremarkable. Compression fracture deformity of the mid to inferior thoracic spine, unchanged. IMPRESSION: No acute cardiopulmonary abnormality. Electronically Signed   By: Valentino Saxon M.D.   On: 09/16/2020 15:35    Procedures .Critical Care  Date/Time: 09/17/2020 12:43 AM Performed by: Jeanell Sparrow, DO Authorized by: Jeanell Sparrow, DO   Critical care provider statement:    Critical care time (minutes):  45   Critical care time was exclusive of:  Separately billable procedures and treating other patients   Critical care was necessary to treat or prevent imminent or life-threatening deterioration of the following conditions:  Respiratory failure   Critical care was time spent personally by me on the following activities:  Discussions with consultants, evaluation of patient's response to treatment, examination of patient, ordering and performing treatments and interventions, ordering and review of laboratory studies, ordering and review of radiographic studies, pulse oximetry, re-evaluation of patient's condition, obtaining history from patient or surrogate and review of old charts   Care discussed with: admitting provider     Medications Ordered in ED Medications  feeding supplement (BOOST /  RESOURCE BREEZE) liquid 1 Container (1 Container Oral Not Given 09/16/20 2119)  ipratropium-albuterol (DUONEB) 0.5-2.5 (3) MG/3ML nebulizer solution 3 mL (3 mLs Nebulization Given 09/16/20 2300)  0.9 %  sodium chloride infusion ( Intravenous Handoff 09/16/20 2100)  acetaminophen (TYLENOL) tablet 500 mg (has no administration in time range)  ALPRAZolam Duanne Moron) tablet 0.125 mg (0.125 mg Oral Given 09/16/20 2100)  ascorbic acid (VITAMIN C) tablet 500 mg (has no administration in time range)  cholecalciferol (VITAMIN D) tablet 2,000 Units (has no administration in time range)  omega-3 acid ethyl esters (LOVAZA) capsule 1 g (has no administration in time range)  Chlorhexidine Gluconate Cloth 2 % PADS 6 each (has no administration in time range)  albuterol (PROVENTIL) (2.5 MG/3ML) 0.083% nebulizer solution 2.5 mg (has no administration in time range)  sodium chloride 0.9 % bolus 500 mL (0 mLs Intravenous Stopped 09/16/20 1834)  phytonadione (VITAMIN K) tablet 2.5 mg (2.5 mg Oral Given 09/16/20 1619)  ipratropium-albuterol (DUONEB) 0.5-2.5 (3) MG/3ML nebulizer solution 3 mL (3 mLs Nebulization Given 09/16/20 1618)  sodium chloride 0.9 % bolus 500 mL (0 mLs Intravenous Stopping Infusion hung by another clincian 09/16/20 2100)  magnesium sulfate IVPB 2 g 50 mL (0 g Intravenous Stopped 09/16/20 2220)    ED Course  I have reviewed the triage vital signs and the nursing notes.  Pertinent labs & imaging results that were available during my care of the patient were reviewed by me and considered in my medical decision making (see chart for details).    MDM Rules/Calculators/A&P  This patient complains of fatigue, cough, dyspnea; this involves an extensive number of treatment Options and is a complaint that carries with it a high risk of complications and Morbidity.  Vital signs reviewed, he is tachycardic, tachypneic.  He is not hypoxic.  Blood pressure is stable.  He is conversive, diminished breath sounds  bilateral.  Not hypoxic.  He is very frail appearing.  Serious etiologies considered.    Labs reviewed, patient with significant hypercapnia, he has hx of OSA on cpap but does not like using his CPAP. Will start on BIPAP and repeat gas in 1 hour to assess. Given nebs. He has allergy to parenteral steroids so will hold of on this. Repeat gas is improved, pH improving. Continue BIPAP for now.  Pt with INR >10, poor dietary intake, malnourished. No evidence of acute bleed. Give oral vitamin K and hold next warfarin dose. Rpt INR in 12 hours recommended.   ECG without ischemia acutely, CXR stable. Troponin negative. Doubt ACS.  Pt with high risk for respiratory decompensation with BIPAP removal, he does not like using his home CPAP. Encouraged pt to keep BIPAP on, recommend stepdown.  Given the above recommend admission, pt agreeable. D/w hospitalist who accepts patient.     Final Clinical Impression(s) / ED Diagnoses Final diagnoses:  Supratherapeutic INR  Hypoxia  SOB (shortness of breath)  Acute respiratory failure with hypoxia and hypercapnia (HCC)  OSA (obstructive sleep apnea)    Rx / DC Orders ED Discharge Orders     None        Jeanell Sparrow, DO 09/17/20 0045

## 2020-09-16 NOTE — ED Notes (Signed)
Pt taken off Bi-pap per Dr. Flossie Buffy request. RT notified. Pt placed on 2l/min [place].

## 2020-09-16 NOTE — H&P (Addendum)
History and Physical    Austin Davis GGY:[id number] DOB: [birth year] DOA: 09/16/2020  PCP: [name], MD  Patient coming from: Home  I have personally briefly reviewed patient's old medical records in [place]  Chief Complaint: increasing shortness of breath  HPI: Austin Davis is a 80 y.o. male with medical history significant for paroxysmal atrial fibrillation, mitral valve annuloplasty and maze in 2005 on Coumadin, PAD, history of GI bleed, essential tremor, OSA not on CPAP and hyperlipidemia who presents with increasing shortness of breath.  History taking was difficult due to patient being on BiPAP mask.  Patient reports he has been having progressive decline for at least a year.  He states that he went from being overweight now to being extremely thin.  He has very low appetite.  He used to be able to ambulate independently but now needs assistance of his girlfriend to get around the house due to weakness and increasing shortness of breath.  Per documentation, he had a sleep study done in October 2021 and was found to have OSA but did not receive his CPAP until July due to backorder.  However he has not been using it since it makes it hard for him to breathe.  He reports he has been having some fever but could not give me an objective temperature.  Has occasional cough without sputum.  Denies any history of tobacco use or any formal diagnosis of lung disorder. Reports he has been pan scanned by an endocrinology about 6-8 weeks ago but ruled out for malignancy.  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  ED Course: He was afebrile, tachycardic and intermittently hypotensive down to systolic of 90 and hypoxic down to 90.  On initial ABG, he was acidotic with pH of 7.227 with CO2 90, bicarb of 36.  He was initiated on BiPAP and after 2 hours had pH normal for CO2 of 51 and bicarb of 32.  Chest x-ray was negative. Normal CBC showed no leukocytosis,  hemoglobin of 17.8 with hematocrit 55.1.  Creatinine stable 0.80.  BG of 126 3.  Troponin of 6.  INR noted to be significantly elevated at 10.9 with no signs of overt bleeding.  He was given 2.5 mg of vitamin K in the ED.  Hospitalist on-call for admission.  Review of Systems:  Constitutional: + Weight Change, No Fever ENT/Mouth: No sore throat, No Rhinorrhea Eyes: No Eye Pain, No Vision Changes Cardiovascular: No Chest Pain, + SOB, No PND, + Dyspnea on Exertion, Respiratory: + Cough, No Sputum Gastrointestinal: No Nausea, No Vomiting, No Diarrhea, , No Pain Genitourinary: no Urinary Incontinence Musculoskeletal: No Arthralgias, No Myalgias Skin: No Skin Lesions, No Pruritus, Neuro: +Weakness, No Numbness Psych: + Anxiety/Panic, No Depression, + decrease appetite Heme/Lymph: No Bruising, No Bleeding  Past Medical History:  Diagnosis Date   CHF (congestive heart failure) (HCC)    Coronary artery disease    GI bleed 2019   hospitalized at [facility] for one week   Headache    since childhood   Low blood sugar    since childhood, controlled by diet   Mitral regurgitation    Mitral valve prolapse    Osteopenia 2021   Paroxysmal atrial fibrillation ([place])     Past Surgical History:  Procedure Laterality Date   COLONOSCOPY WITH PROPOFOL N/A 02/19/2017   Procedure: COLONOSCOPY WITH PROPOFOL;  Surgeon: Wilford Corner, MD;  Location: [facility];  Service: Endoscopy;  Laterality: N/A;  laser eye surgery for retina detachment     MITRAL VALVE REPAIR  01/2003   monitor  02/05/2006   polyp removal     TONSILLECTOMY     tooth removal     as a teenager     reports that he has never smoked. He has never used smokeless tobacco. He reports current alcohol use. He reports that he does not use drugs. Social History  Allergies  Allergen Reactions   Prednisone Other (See Comments)    Makes skin crawl, rapid HR   Cortisone Palpitations    Family History  Problem Relation Age of  Onset   Heart disease Mother    CAD Mother    Migraines Mother    Heart failure Father    Skin cancer Father    Valvular heart disease Father        prolapsed valve   Tremor Father        "probably had a bit of tremor in his old age"   Heart failure Maternal Grandmother    Pneumonia Maternal Grandfather    Heart failure Paternal Grandmother    Stroke Paternal Grandfather    Asthma Daughter    Epilepsy Son    Parkinson's disease Neg Hx      Prior to Admission medications   Medication Sig Start Date End Date Taking? Authorizing Provider  acetaminophen (TYLENOL) 500 MG tablet Take 500 mg by mouth every 6 (six) hours as needed for mild pain or headache.    [provider]  Ascorbic Acid (VITAMIN C IMMUNE HEALTH PO) Take 500 mg by mouth.    [provider]  B Complex Vitamins (VITAMIN B COMPLEX PO) Take 1 tablet by mouth daily.    [provider]  Cholecalciferol (D3) 50 MCG (2000 UT) TABS Take 2,000 Units by mouth daily.    [provider]  Garlic 563 MG TABS Take 100 mg by mouth daily.    [provider]  loratadine (CLARITIN) 10 MG tablet Take 10 mg by mouth daily as needed for allergies.    [provider]  Magnesium 250 MG TABS Take 250 mg by mouth daily.    [provider]  Omega-3 Fatty Acids (FISH OIL PO) Take 1 capsule by mouth daily.    [provider]  primidone (MYSOLINE) 50 MG tablet Start with 1/2 pill at bedtime(25mg ). If no side effects in 2 weeks, may increase to a whole tablet(50mg ) at bedtime otherwise continue with 1/2 pill(25mg ) 07/25/20   Melvenia Beam, MD  warfarin (COUMADIN) 5 MG tablet Take 1 tablet (5 mg total) by mouth daily. 07/01/20   Oswald Hillock, MD    Physical Exam: Vitals:   09/16/20 1639 09/16/20 1755 09/16/20 1815 09/16/20 1833  BP:  98/78 99/70 (!) 121/109  Pulse:  89 89   Resp:  (!) 22    Temp:      TempSrc:      SpO2: 97% 100% 100%   Weight:      Height:         Constitutional: thin cachetic fail tremulous elderly male laying in bed with full facial beard and Bipap mask. Vitals:   09/16/20 1639 09/16/20 1755 09/16/20 1815 09/16/20 1833  BP:  98/78 99/70 (!) 121/109  Pulse:  89 89   Resp:  (!) 22    Temp:      TempSrc:      SpO2: 97% 100% 100%   Weight:  Height:       Eyes: PERRL, right subconjunctival hemorrhage ENMT: Mucous membranes are dry Neck: normal, supple Respiratory: Diminished breath sounds throughout with expiratory wheezing even while on BiPAP.  Tremulous when speaking.  Patient breathes through mouth. Cardiovascular: Regular rate and rhythm, no murmurs / rubs / gallops. No extremity edema. 2+ pedal pulses.  Abdomen: no tenderness, no masses palpated. Bowel sounds positive.  Musculoskeletal: no clubbing / cyanosis. No joint deformity upper and lower extremities.  Cachectic with muscle wasting on all extremities. Skin: Erythema surrounding sacral and lower lumbar region Neurologic: CN 2-12 grossly intact. Sensation intact, patient required 2 person assist in order to rollover to evaluate back and to move up in bed. Psychiatric: Normal judgment and insight. Alert and oriented x 3. Normal mood.     Labs on Admission: I have personally reviewed following labs and imaging studies  CBC: Recent Labs  Lab 09/16/20 1512  WBC 8.5  NEUTROABS 7.0  HGB 17.8*  HCT 55.1*  MCV 98.2  PLT 941   Basic Metabolic Panel: Recent Labs  Lab 09/16/20 1512  NA 140  K 4.5  CL 91*  CO2 36*  GLUCOSE 123*  BUN 34*  CREATININE 0.80  CALCIUM 9.4   GFR: Estimated Creatinine Clearance: 48.1 mL/min (by C-G formula based on SCr of 0.8 mg/dL). Liver Function Tests: Recent Labs  Lab 09/16/20 1512  AST 34  ALT 43  ALKPHOS 46  BILITOT 1.4*  PROT 6.7  ALBUMIN 4.4   Recent Labs  Lab 09/16/20 1513  LIPASE 36   No results for input(s): AMMONIA in the last 168 hours. Coagulation Profile: Recent Labs  Lab 09/16/20 1513  INR  10.9*   Cardiac Enzymes: No results for input(s): CKTOTAL, CKMB, CKMBINDEX, TROPONINI in the last 168 hours. BNP (last 3 results) No results for input(s): PROBNP in the last 8760 hours. HbA1C: No results for input(s): HGBA1C in the last 72 hours. CBG: No results for input(s): GLUCAP in the last 168 hours. Lipid Profile: No results for input(s): CHOL, HDL, LDLCALC, TRIG, CHOLHDL, LDLDIRECT in the last 72 hours. Thyroid Function Tests: Recent Labs    09/16/20 1512  TSH 1.572   Anemia Panel: No results for input(s): VITAMINB12, FOLATE, FERRITIN, TIBC, IRON, RETICCTPCT in the last 72 hours. Urine analysis:    Component Value Date/Time   COLORURINE YELLOW 06/30/2020 2307   APPEARANCEUR CLEAR 06/30/2020 2307   LABSPEC 1.025 06/30/2020 2307   PHURINE 6.0 06/30/2020 2307   GLUCOSEU 100 (A) 06/30/2020 2307   HGBUR SMALL (A) 06/30/2020 2307   BILIRUBINUR NEGATIVE 06/30/2020 2307   KETONESUR NEGATIVE 06/30/2020 2307   PROTEINUR NEGATIVE 06/30/2020 2307   UROBILINOGEN 0.2 09/25/2006 1500   NITRITE NEGATIVE 06/30/2020 2307   LEUKOCYTESUR NEGATIVE 06/30/2020 2307    Radiological Exams on Admission: DG Chest 2 View  Result Date: 09/16/2020 CLINICAL DATA:  sob EXAM: CHEST - 2 VIEW COMPARISON:  June 30, 2020 FINDINGS: The cardiomediastinal silhouette is unchanged in contour.Tortuous thoracic aorta. Status post median sternotomy. No pleural effusion. No pneumothorax. No acute pleuroparenchymal abnormality. Visualized abdomen is unremarkable. Compression fracture deformity of the mid to inferior thoracic spine, unchanged. IMPRESSION: No acute cardiopulmonary abnormality. Electronically Signed   By: Valentino Saxon M.D.   On: 09/16/2020 15:35      Assessment/Plan  Acute hypoxic and hypercarbic respiratory failure with respiratory acidosis -suspect chronic CO2 retention due to not using CPAP.  Patient reportedly diagnosed with OSA last year but did not receive CPAP until few  months ago  due to backorder.  Since receiving it states he cannot tolerate it. -Also obtain echo given hx of mitral annuloplasty -Patient has been on BiPAP for 2 hours with improvement of ABG. Trial off Bipap for an hour but immediately became acidotic and hypercapnic. Will resume Bipap overnight. -Give Mg x 1. Duoneb scheduled q4hr  -need pulmonology consult   Hypotension -likely due to hypovolemia. No clear signs of infection at this time. -give additional 500cc bolus and keep on continuous NS overnight -check AM cortisol level since he reports endocrinology telling him he has "gland" problems.Could consider steroids if BP remains low. -likely will need midodrine   Failure to thrive/weakness -consult dietican  -PT eval -had work up with endocrinology in June 2022 and had PET scan showing no evidence of pargnaglioma with physiologic activity within the adrenal glands. Unfortunately no other documentation found in Epic. Recommends continual f/u with endocrinology outpatient  -check HIV, RPR  Hx of mitral annuloplasty with supratherapeutic INR -Admit with INR of 10.9. Goal of 2-3 -No overt bleeding and declines any melena.  Given 5mg  of vitamin K in ED. Will hold and check INR daily -last echo with EF of 45-50% with grade 1 diastolic dysfunction  Paroxysmal atrial fibrillation s/p MAZE -documented to have intolerance to beta-blocker in the past  Essential tremor  -Followed by neurology.  Could not tolerate beta-blockers in the past.  Declined gabapentin. -During patient's last admission, tremor noted to be improved following Xanax suggesting possible anxiety component -Continue qHS PRN Xanax  Stage 1 pressure sacral ulcer -Place sacral foam prophylactic dressing  Right subconjunctival hemorrhage - Continue to monitor   DVT prophylaxis:SCD Code Status: Full Family Communication: Plan discussed with patient at bedside  disposition Plan: Home with at least 2 midnight stays  Consults  called:  Admission status: inpatient  Level of care: Stepdown  Status is: Inpatient  Remains inpatient appropriate because:Inpatient level of care appropriate due to severity of illness  Dispo: The patient is from: Home              Anticipated d/c is to:  TBD              Patient currently is not medically stable to d/c.   Difficult to place patient No         Orene Desanctis DO [facility]   If 7PM-7AM, please contact night-coverage [email or web address]   09/16/2020, 7:22 PM

## 2020-09-17 ENCOUNTER — Telehealth: Payer: Self-pay | Admitting: Pulmonary Disease

## 2020-09-17 ENCOUNTER — Inpatient Hospital Stay (HOSPITAL_COMMUNITY): Payer: Medicare Other

## 2020-09-17 ENCOUNTER — Institutional Professional Consult (permissible substitution): Payer: Medicare Other | Admitting: Pulmonary Disease

## 2020-09-17 ENCOUNTER — Other Ambulatory Visit: Payer: Self-pay

## 2020-09-17 ENCOUNTER — Encounter (HOSPITAL_COMMUNITY): Payer: Self-pay | Admitting: Family Medicine

## 2020-09-17 DIAGNOSIS — R627 Adult failure to thrive: Secondary | ICD-10-CM | POA: Diagnosis not present

## 2020-09-17 DIAGNOSIS — J9622 Acute and chronic respiratory failure with hypercapnia: Secondary | ICD-10-CM | POA: Diagnosis not present

## 2020-09-17 DIAGNOSIS — L98429 Non-pressure chronic ulcer of back with unspecified severity: Secondary | ICD-10-CM | POA: Diagnosis not present

## 2020-09-17 DIAGNOSIS — J9621 Acute and chronic respiratory failure with hypoxia: Principal | ICD-10-CM

## 2020-09-17 DIAGNOSIS — R0602 Shortness of breath: Secondary | ICD-10-CM | POA: Diagnosis not present

## 2020-09-17 DIAGNOSIS — G250 Essential tremor: Secondary | ICD-10-CM | POA: Diagnosis not present

## 2020-09-17 DIAGNOSIS — E43 Unspecified severe protein-calorie malnutrition: Secondary | ICD-10-CM | POA: Insufficient documentation

## 2020-09-17 DIAGNOSIS — I480 Paroxysmal atrial fibrillation: Secondary | ICD-10-CM | POA: Diagnosis not present

## 2020-09-17 DIAGNOSIS — J9602 Acute respiratory failure with hypercapnia: Secondary | ICD-10-CM

## 2020-09-17 DIAGNOSIS — J9601 Acute respiratory failure with hypoxia: Secondary | ICD-10-CM

## 2020-09-17 LAB — URINALYSIS, ROUTINE W REFLEX MICROSCOPIC
Bacteria, UA: NONE SEEN
Bilirubin Urine: NEGATIVE
Glucose, UA: NEGATIVE mg/dL
Ketones, ur: 20 mg/dL — AB
Leukocytes,Ua: NEGATIVE
Nitrite: NEGATIVE
Protein, ur: NEGATIVE mg/dL
RBC / HPF: >50 RBC/hpf — ABNORMAL HIGH (ref 0–5)
Specific Gravity, Urine: 1.019 (ref 1.005–1.030)
pH: 5 (ref 5.0–8.0)

## 2020-09-17 LAB — HIV ANTIBODY (ROUTINE TESTING W REFLEX): HIV Screen 4th Generation wRfx: NONREACTIVE

## 2020-09-17 LAB — BLOOD GAS, ARTERIAL
Acid-Base Excess: 4.6 mmol/L — ABNORMAL HIGH (ref 0.0–2.0)
Bicarbonate: 24.7 mmol/L (ref 20.0–28.0)
FIO2: 30
O2 Saturation: 99.4 %
Patient temperature: 98.6
pCO2 arterial: 23.7 mmHg — ABNORMAL LOW (ref 32.0–48.0)
pH, Arterial: 7.621 (ref 7.350–7.450)
pO2, Arterial: 132 mmHg — ABNORMAL HIGH (ref 83.0–108.0)

## 2020-09-17 LAB — CBC
HCT: 40.5 % (ref 39.0–52.0)
Hemoglobin: 13.5 g/dL (ref 13.0–17.0)
MCH: 32.5 pg (ref 26.0–34.0)
MCHC: 33.3 g/dL (ref 30.0–36.0)
MCV: 97.4 fL (ref 80.0–100.0)
Platelets: 104 10*3/uL — ABNORMAL LOW (ref 150–400)
RBC: 4.16 MIL/uL — ABNORMAL LOW (ref 4.22–5.81)
RDW: 13.9 % (ref 11.5–15.5)
WBC: 7.3 10*3/uL (ref 4.0–10.5)
nRBC: 0 % (ref 0.0–0.2)

## 2020-09-17 LAB — BASIC METABOLIC PANEL WITH GFR
Anion gap: 12 (ref 5–15)
BUN: 31 mg/dL — ABNORMAL HIGH (ref 8–23)
CO2: 27 mmol/L (ref 22–32)
Calcium: 8.5 mg/dL — ABNORMAL LOW (ref 8.9–10.3)
Chloride: 99 mmol/L (ref 98–111)
Creatinine, Ser: 0.81 mg/dL (ref 0.61–1.24)
GFR, Estimated: >60 mL/min
Glucose, Bld: 80 mg/dL (ref 70–99)
Potassium: 4.5 mmol/L (ref 3.5–5.1)
Sodium: 138 mmol/L (ref 135–145)

## 2020-09-17 LAB — PROTIME-INR
INR: 6.4 (ref 0.8–1.2)
Prothrombin Time: 56 seconds — ABNORMAL HIGH (ref 11.4–15.2)

## 2020-09-17 LAB — ECHOCARDIOGRAM COMPLETE
Area-P 1/2: 5.02 cm2
Calc EF: 47.4 %
Height: 67 in
MV VTI: 1.08 cm2
S' Lateral: 2.6 cm
Single Plane A2C EF: 49.6 %
Single Plane A4C EF: 43.4 %
Weight: 1600 oz

## 2020-09-17 LAB — PHOSPHORUS: Phosphorus: 2.7 mg/dL (ref 2.5–4.6)

## 2020-09-17 LAB — CORTISOL-AM, BLOOD: Cortisol - AM: 21.4 ug/dL (ref 6.7–22.6)

## 2020-09-17 MED ORDER — CHLORHEXIDINE GLUCONATE 0.12 % MT SOLN
15.0000 mL | Freq: Two times a day (BID) | OROMUCOSAL | Status: DC
  Administered 2020-09-17 – 2020-09-29 (×23): 15 mL via OROMUCOSAL
  Filled 2020-09-17 (×21): qty 15

## 2020-09-17 MED ORDER — MIDODRINE HCL 5 MG PO TABS: 5.0000 mg | ORAL_TABLET | Freq: Three times a day (TID) | ORAL | Status: DC

## 2020-09-17 MED ORDER — ADULT MULTIVITAMIN W/MINERALS CH
1.0000 | ORAL_TABLET | Freq: Every day | ORAL | Status: DC
  Administered 2020-09-17 – 2020-09-29 (×12): 1 via ORAL
  Filled 2020-09-17 (×12): qty 1

## 2020-09-17 MED ORDER — FOLIC ACID 1 MG PO TABS
1.0000 mg | ORAL_TABLET | Freq: Every day | ORAL | Status: DC
  Administered 2020-09-17 – 2020-09-29 (×12): 1 mg via ORAL
  Filled 2020-09-17 (×12): qty 1

## 2020-09-17 MED ORDER — SODIUM CHLORIDE 0.9 % IV BOLUS
1000.0000 mL | Freq: Once | INTRAVENOUS | Status: AC
  Administered 2020-09-17: 1000 mL via INTRAVENOUS

## 2020-09-17 MED ORDER — ORAL CARE MOUTH RINSE
15.0000 mL | Freq: Two times a day (BID) | OROMUCOSAL | Status: DC
  Administered 2020-09-17 – 2020-09-28 (×16): 15 mL via OROMUCOSAL

## 2020-09-17 MED ORDER — MIDODRINE HCL 5 MG PO TABS
5.0000 mg | ORAL_TABLET | Freq: Three times a day (TID) | ORAL | Status: DC
  Administered 2020-09-17 – 2020-09-20 (×10): 5 mg via ORAL
  Filled 2020-09-17 (×10): qty 1

## 2020-09-17 MED ORDER — SODIUM CHLORIDE 0.9 % IV BOLUS: 500.0000 mL | Freq: Once | INTRAVENOUS | Status: DC

## 2020-09-17 MED ORDER — ENSURE ENLIVE PO LIQD
237.0000 mL | Freq: Two times a day (BID) | ORAL | Status: DC
  Administered 2020-09-17 – 2020-09-29 (×21): 237 mL via ORAL

## 2020-09-17 MED ORDER — SODIUM CHLORIDE 0.9 % IV BOLUS: 1000.0000 mL | Freq: Once | INTRAVENOUS | Status: DC

## 2020-09-17 MED ORDER — PERFLUTREN LIPID MICROSPHERE
1.0000 mL | INTRAVENOUS | Status: AC | PRN
  Administered 2020-09-17: 2 mL via INTRAVENOUS
  Filled 2020-09-17: qty 10

## 2020-09-17 NOTE — Progress Notes (Signed)
Pt seen, resting comfortably, no respiratory distress/increased wob noted.  HR94, rr22, 98% on 2l [place].  Bipap not indicated at this time, but in room on standby if needed.  This writer will place pt on nocturnal cpap tonight after bedtime meds given.

## 2020-09-17 NOTE — Telephone Encounter (Signed)
Spoke to patient's friend, Sue(DPR).  Collie Siad stated that patient is currently admitted in ICU. She is requesting for Dr. Silas Flood to go by and see patient. I have explained to Collie Siad that we have providers on call at the hospital that will go by and see patient if attending providers orders a consult.   Dr. Silas Flood, please advise. Thanks

## 2020-09-17 NOTE — Progress Notes (Signed)
Initial Nutrition Assessment  DOCUMENTATION CODES:   Severe malnutrition in context of chronic illness, Underweight  INTERVENTION:   -Ensure Plus PO BID, each provides 350 kcals and 13g protein  -Multivitamin with minerals daily  NUTRITION DIAGNOSIS:   Severe Malnutrition related to chronic illness as evidenced by percent weight loss, energy intake < or equal to 75% for > or equal to 1 month, severe fat depletion, severe muscle depletion.  GOAL:   Patient will meet greater than or equal to 90% of their needs  MONITOR:   PO intake, Supplement acceptance, Labs, Weight trends, I & O's  REASON FOR ASSESSMENT:   Consult, Malnutrition Screening Tool Assessment of nutrition requirement/status  ASSESSMENT:   80 year old man who presented to [facility] ED 8/29 with SOB, dyspnea and fatigue/malaise, worsening over the last several weeks prior to admission. PMHx significant for HLD, PAF, MVP s/p MV annuloplasty (2005, on Coumadin), PAD, CAD, OSA (on CPAP, noncompliant), history of GIB (2019).     On admission, patient reported general decline of his wellness over the past 1-2 years (starting with COVID) with significant weight loss 2/2 poor appetite and loss of mobility (previously ambulating independently, now requiring assistance).  Patient in room with no family at bedside. Sipping on cranberry juice. Pt states he has had extreme poor appetite while still feeling like he is hungry. States foods that he once enjoyed do not taste the same to him anymore. States he has lunch ordered but was unable to tell me what he ordered. Boost Gwyneth Revels has been ordered for him but he requests a supplement in vanilla, will switch to Ensure.  Pt states his UBW was 173-174 lbs around 1.5-2 years ago. Per weight records, pt has lost 45 lbs since 1/23 (31% wt loss x 7 months, significant for time frame).   Medications: Vitamin C, Vitamin D, Folic acid, Lovaza  Labs reviewed.  NUTRITION - FOCUSED PHYSICAL  EXAM:  Flowsheet Row Most Recent Value  Orbital Region Moderate depletion  Upper Arm Region Severe depletion  Thoracic and Lumbar Region Unable to assess  Buccal Region Unable to assess  Temple Region Moderate depletion  Clavicle Bone Region Severe depletion  Clavicle and Acromion Bone Region Severe depletion  Scapular Bone Region Severe depletion  Dorsal Hand Moderate depletion  Patellar Region Severe depletion  Anterior Thigh Region Moderate depletion  Posterior Calf Region Severe depletion  Edema (RD Assessment) None       Diet Order:   Diet Order             Diet regular Room service appropriate? Yes; Fluid consistency: Thin  Diet effective now                   EDUCATION NEEDS:   Education needs have been addressed  Skin:  Skin Assessment: Skin Integrity Issues: Skin Integrity Issues:: Stage I Stage I: medial coccyx  Last BM:  PTA  Height:   Ht Readings from Last 1 Encounters:  09/16/20 5\' 7"  (1.702 m)    Weight:   Wt Readings from Last 1 Encounters:  09/16/20 45.4 kg    BMI:  Body mass index is 15.66 kg/m.  Estimated Nutritional Needs:   Kcal:  2000-2200  Protein:  90-105g  Fluid:  2L/day  Clayton Bibles, MS, RD, LDN Inpatient Clinical Dietitian Contact information available via Amion

## 2020-09-17 NOTE — Telephone Encounter (Signed)
Patient's friend, Sue(DPR) is aware of below message and voiced her understanding.  Nothing further needed at this time.

## 2020-09-17 NOTE — Consult Note (Signed)
NAME:  Austin Davis, MRN:  [id number], DOB:  [birth year], LOS: 1 ADMISSION DATE:  09/16/2020 CONSULTATION DATE:  09/17/2020 REFERRING MD:  Starla Link - [facility] CHIEF COMPLAINT:  Hypercarbic respiratory failure   History of Present Illness:  Mr. Soltys is seen in consultation at the request of Dr. Starla Link for recommendations on further evaluation and management of hypercarbic respiratory failure.  Patient is a 80 year old man who presented to [facility] ED 8/29 with SOB, dyspnea and fatigue/malaise, worsening over the last several weeks prior to admission. PMHx significant for HLD, PAF, MVP s/p MV annuloplasty (2005, on Coumadin), PAD, CAD, OSA (on CPAP, noncompliant), history of GIB (2019).  On admission, patient reported general decline of his wellness over the past 1-2 years (starting with COVID) with significant weight loss 2/2 poor appetite and loss of mobility (previously ambulating independently, now requiring assistance). He lives with his prior girlfriend (friends for > 20 years) and has occasional assistance from his children. He recalls over a year ago attempting to work in the yard with his son and hurting his back after not being active for so long, which subsequently limited his mobility. As far as appetite, he reports dysgeusia noting that his favorite foods taste strange or do not taste appetizing anymore. He was diagnosed with OSA 10/2019 and CPAP was ordered but not delivered until 07/2020 due to supply issues; he reports significant issues with mask fit/discomfort and associated claustrophobia, especially while lying flat. As a result, he has only attempted to wear his mask a handful of times since receiving his machine.  Pertinent Medical History:   Past Medical History:  Diagnosis Date   CHF (congestive heart failure) ([place])    Coronary artery disease    GI bleed 2019   hospitalized at [facility] for one week   Headache    since childhood   Low blood sugar    since childhood, controlled  by diet   Mitral regurgitation    Mitral valve prolapse    Osteopenia 2021   Paroxysmal atrial fibrillation ([place])    Significant Hospital Events: Including procedures, antibiotic start and stop dates in addition to other pertinent events   8/28 - Presented to [facility] ED for SOB, dyspnea. Admitted for hypercarbic RF. 8/29 - PCCM consulted for hypercarbic RF requiring BiPAP. Nutrition consult.  Interim History / Subjective:  PCCM consulted  Objective:  Blood pressure 119/62, pulse (!) 110, temperature 97.9 F (36.6 C), temperature source Axillary, resp. rate (!) 24, height 5\' 7"  (1.702 m), weight 45.4 kg, SpO2 100 %.    FiO2 (%):  [28 %-35 %] 28 %   Intake/Output Summary (Last 24 hours) at 09/17/2020 0752 Last data filed at 09/17/2020 0441 Gross per 24 hour  Intake 1711.65 ml  Output 675 ml  Net 1036.65 ml   Filed Weights   09/16/20 1450  Weight: 45.4 kg   Physical Examination: General: Chronically ill-appearing elderly gentleman in NAD. Cachectic. HEENT: [place]/AT, R subconjunctival hemorrhage noted, PERRL, dry mucous membranes. Neuro: Awake, oriented x 4. Responds to verbal stimuli. Following commands consistently. Moves all 4 extremities spontaneously. Strength 3/5 in all 4 extremities. CV: RRR, no m/g/r. PULM: Breathing even and mildly labored 2LNC, mildly tachypneic with speech. Lung fields with fair air movement bilaterally. GI: Soft, nontender, nondistended. Normoactive bowel sounds. Extremities: No LE edema noted. Skin: Very dry, warm to touch. Venous stasis changes noted to BLE.  Resolved Hospital Problem List:    Assessment & Plan:  This patient is seen in consultation  at the request of Dr. Starla Link for recommendations on further evaluation and management of hypercarbic respiratory failure.  80 year old man who presented to [facility] ED 8/29 with SOB, dyspnea and fatigue/malaise, worsening over the last several weeks prior to admission. PMHx significant for HLD, PAF, MVP s/p MV  annuloplasty (2005, on Coumadin), PAD, CAD, OSA (on CPAP, noncompliant), history of GIB (2019).  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 OSA on CPAP Protein-calorie malnutrition Physical deconditioning  - Continue BiPAP support QHS and PRN - Continue supplemental O2, wean for O2 sat > 90% - Pulmonary hygiene - Continue home Xanax PRN for NIV mask-associated claustrophobia - PT/OT consults, consider SLP - Agree with Nutrition consult, appreciate recs - Brief GOC discussion at bedside, patient continues to wish for full code status at this time - Remainder per Primary Team  Best Practice: (right click and "Reselect all SmartList Selections" daily)   Per Primary Team  Labs:  CBC: Recent Labs  Lab 09/16/20 1512 09/17/20 0439  WBC 8.5 7.3  NEUTROABS 7.0  --   HGB 17.8* 13.5  HCT 55.1* 40.5  MCV 98.2 97.4  PLT 166 161*   Basic Metabolic Panel: Recent Labs  Lab 09/16/20 1512 09/17/20 0439  NA 140 138  K 4.5 4.5  CL 91* 99  CO2 36* 27  GLUCOSE 123* 80  BUN 34* 31*  CREATININE 0.80 0.81  CALCIUM 9.4 8.5*   GFR: Estimated Creatinine Clearance: 47.5 mL/min (by C-G formula based on SCr of 0.81 mg/dL). Recent Labs  Lab 09/16/20 1512 09/17/20 0439  WBC 8.5 7.3   Liver Function Tests: Recent Labs  Lab 09/16/20 1512  AST 34  ALT 43  ALKPHOS 46  BILITOT 1.4*  PROT 6.7  ALBUMIN 4.4   Recent Labs  Lab 09/16/20 1513  LIPASE 36   No results for input(s): AMMONIA in the last 168 hours.  ABG:    Component Value Date/Time   PHART 7.621 (HH) 09/17/2020 0432   PCO2ART 23.7 (L) 09/17/2020 0432   PO2ART 132 (H) 09/17/2020 0432   HCO3 24.7 09/17/2020 0432   TCO2 30 09/25/2006 1353   O2SAT 99.4 09/17/2020 0432    Coagulation Profile: Recent Labs  Lab 09/16/20 1513 09/17/20 0439  INR 10.9* 6.4*   Cardiac Enzymes: No results for input(s): CKTOTAL, CKMB, CKMBINDEX, TROPONINI in the last 168 hours.  HbA1C: Hgb A1c MFr Bld  Date/Time Value Ref  Range Status  01/30/2015 11:15 AM 5.9 (H) <5.7 % Final    Comment:                                                                           According to the ADA Clinical Practice Recommendations for 2011, when HbA1c is used as a screening test:     >=6.5%   Diagnostic of Diabetes Mellitus            (if abnormal result is confirmed)   5.7-6.4%   Increased risk of developing Diabetes Mellitus   References:Diagnosis and Classification of Diabetes Mellitus,Diabetes WRUE,4540,98(JXBJY 1):S62-S69 and Standards of Medical Care in         Diabetes - 2011,Diabetes NWGN,5621,30 (Suppl 1):S11-S61.     04/26/2013 09:15 AM 6.1 (H) <5.7 % Final  Comment:                                                                           According to the ADA Clinical Practice Recommendations for 2011, when HbA1c is used as a screening test:     >=6.5%   Diagnostic of Diabetes Mellitus            (if abnormal result is confirmed)   5.7-6.4%   Increased risk of developing Diabetes Mellitus   References:Diagnosis and Classification of Diabetes Mellitus,Diabetes YPPJ,0932,67(TIWPY 1):S62-S69 and Standards of Medical Care in         Diabetes - 2011,Diabetes KDXI,3382,50 (Suppl 1):S11-S61.     CBG: No results for input(s): GLUCAP in the last 168 hours.  Review of Systems:   Review of systems completed with pertinent positives/negatives outlined in above HPI.  Past Medical History:  He,  has a past medical history of CHF (congestive heart failure) ([place]), Coronary artery disease, GI bleed (2019), Headache, Low blood sugar, Mitral regurgitation, Mitral valve prolapse, Osteopenia (2021), and Paroxysmal atrial fibrillation ([place]).   Surgical History:   Past Surgical History:  Procedure Laterality Date   COLONOSCOPY WITH PROPOFOL N/A 02/19/2017   Procedure: COLONOSCOPY WITH PROPOFOL;  Surgeon: Wilford Corner, MD;  Location: [facility] ENDOSCOPY;  Service: Endoscopy;  Laterality: N/A;   laser eye surgery for  retina detachment     MITRAL VALVE REPAIR  01/2003   monitor  02/05/2006   polyp removal     TONSILLECTOMY     tooth removal     as a teenager    Social History:   reports that he has never smoked. He has never used smokeless tobacco. He reports current alcohol use. He reports that he does not use drugs.   Family History:  His family history includes Asthma in his daughter; CAD in his mother; Epilepsy in his son; Heart disease in his mother; Heart failure in his father, maternal grandmother, and paternal grandmother; Migraines in his mother; Pneumonia in his maternal grandfather; Skin cancer in his father; Stroke in his paternal grandfather; Tremor in his father; Valvular heart disease in his father. There is no history of Parkinson's disease.   Allergies: Allergies  Allergen Reactions   Prednisone Other (See Comments)    Makes skin crawl, rapid HR   Cortisone Palpitations    Home Medications: Prior to Admission medications   Medication Sig Start Date End Date Taking? Authorizing Provider  acetaminophen (TYLENOL) 500 MG tablet Take 500 mg by mouth every 6 (six) hours as needed for mild pain or headache.   Yes [provider]  ALPRAZolam (XANAX) 0.25 MG tablet Take 0.125 mg by mouth at bedtime as needed for anxiety.   Yes [provider]  Ascorbic Acid (VITAMIN C IMMUNE HEALTH PO) Take 500 mg by mouth.   Yes [provider]  B Complex Vitamins (VITAMIN B COMPLEX PO) Take 1 tablet by mouth daily.   Yes [provider]  Cholecalciferol (D3) 50 MCG (2000 UT) TABS Take 2,000 Units by mouth daily.   Yes [provider]  diphenhydramine-acetaminophen (TYLENOL PM) 25-500 MG TABS tablet Take 1 tablet by mouth at bedtime as needed.   Yes [provider]  Garlic 939 MG TABS Take 100 mg by mouth daily.   Yes [provider]  Magnesium 250 MG TABS Take 250 mg by mouth daily.   Yes [provider]  Omega-3 Fatty Acids (FISH OIL  PO) Take 1 capsule by mouth daily.   Yes [provider]  warfarin (COUMADIN) 1 MG tablet Take 1 mg by mouth daily.   Yes [provider]  primidone (MYSOLINE) 50 MG tablet Start with 1/2 pill at bedtime(25mg ). If no side effects in 2 weeks, may increase to a whole tablet(50mg ) at bedtime otherwise continue with 1/2 pill(25mg ) Patient not taking: No sig reported 07/25/20   Melvenia Beam, MD  warfarin (COUMADIN) 5 MG tablet TAKE 1/2 TO 1 TABLET DAILY AS DIRECTED BY COUMADIN CLINIC. 09/17/20   Minus Breeding, MD    Critical care time: 35 minutes   Lestine Mount, PA-C [facility] 09/17/20 7:52 AM  Please see [email or web address] for pager details.  From 7A-7P if no response, please call [phone] After hours, please call ELink [phone]

## 2020-09-17 NOTE — Evaluation (Addendum)
Physical Therapy Evaluation Patient Details Name: Austin Davis MRN: [id number] DOB: Aug 09, 1940 Today's Date: 09/17/2020   History of Present Illness  80 y.o. male with medical history significant for paroxysmal atrial fibrillation, mitral valve annuloplasty , PAD, history of GI bleed, essential tremor, OSA not able to tolerate CPAP and hyperlipidemia, progressive health declineand weight loss. Presented 09/16/2020 with increasing shortness of breath, tachycardic and slightly hypotensive and hypoxic with oxygen saturations below 90.  On initial ABG, he was acidotic with pH of 7.227 with PCO2 of 90.  He was started on BiPAP.  Chest x-ray was negative for infiltrates.  INR was 10.9  Clinical Impression  The patient is very frail and weak, tells therapist several times that he "has been slowly spiraling down>". Patient mobilized very slowly to sitting on bed edge, Required 2  assist to stand and pivot to recliner. Patient maintained on  2 L [place], Spo2 >92%, , BP 115./62, HR 90.  Patient reports his significant other has been assisting him at home duue to being very weak.  Pt admitted with above diagnosis.  Pt currently with functional limitations due to the deficits listed below (see PT Problem List). Pt will benefit from skilled PT to increase their independence and safety with mobility to allow discharge to the venue listed below.        Follow Up Recommendations Home health PT vs. SNF, depends on progress.    Equipment Recommendations  Rolling walker with 5" wheels    Recommendations for Other Services    OT   Precautions / Restrictions Precautions Precautions: Fall Precaution Comments: monitor sats      Mobility  Bed Mobility Overal bed mobility: Needs Assistance Bed Mobility: Supine to Sit     Supine to sit: HOB elevated;Min assist     General bed mobility comments: extra time  to initiate and  progress legs to bed edge, Min assistance to sit upright fully.     Transfers Overall transfer level: Needs assistance Equipment used: 2 person hand held assist Transfers: Sit to/from [facility] Sit to Stand: Mod assist;+2 safety/equipment;+2 physical assistance Stand pivot transfers: +2 physical assistance;+2 safety/equipment;Mod assist       General transfer comment: mod assistance to rise from bed,  2 HHA, shuffle steps to  recliner, cues to reach back to sit down.  Ambulation/Gait                Stairs            Wheelchair Mobility    Modified Rankin (Stroke Patients Only)       Balance Overall balance assessment: Needs assistance Sitting-balance support: Feet supported;Bilateral upper extremity supported Sitting balance-Leahy Scale: Fair     Standing balance support: During functional activity;Single extremity supported Standing balance-Leahy Scale: Poor Standing balance comment: reliant on UE support                             Pertinent Vitals/Pain Pain Assessment: No/denies pain    Home Living Family/patient expects to be discharged to:: Private residence Living Arrangements: Spouse/significant other Available Help at Discharge: Friend(s);Available 24 hours/day Type of Home: House Home Access: Stairs to enter   [employer] of Steps: 1 Home Layout: One level Home Equipment: Cane - single point      Prior Function           Comments: recently has been having assistance to ambulate in home  Hand Dominance   Dominant Hand: Right    Extremity/Trunk Assessment   Upper Extremity Assessment Upper Extremity Assessment: Generalized weakness    Lower Extremity Assessment Lower Extremity Assessment: Generalized weakness    Cervical / Trunk Assessment Cervical / Trunk Assessment: Normal  Communication   Communication: No difficulties  Cognition Arousal/Alertness: Awake/alert Behavior During Therapy: WFL for tasks assessed/performed;Flat affect Overall  Cognitive Status: No family present, answered several times that he has been spiraling down for some time. Oriented to hospital and [place].                                        General Comments      Exercises     Assessment/Plan    PT Assessment Patient needs continued PT services  PT Problem List Decreased strength;Decreased mobility;Decreased knowledge of precautions;Decreased activity tolerance;Cardiopulmonary status limiting activity;Decreased balance;Decreased knowledge of use of DME       PT Treatment Interventions DME instruction;Therapeutic activities    PT Goals (Current goals can be found in the Care Plan section)  Acute Rehab PT Goals Patient Stated Goal: to get better PT Goal Formulation: With patient Time For Goal Achievement: 10/01/20 Potential to Achieve Goals: Fair    Frequency Min 3X/week   Barriers to discharge        Co-evaluation               AM-PAC PT "6 Clicks" Mobility  Outcome Measure Help needed turning from your back to your side while in a flat bed without using bedrails?: A Little Help needed moving from lying on your back to sitting on the side of a flat bed without using bedrails?: A Little Help needed moving to and from a bed to a chair (including a wheelchair)?: A Lot Help needed standing up from a chair using your arms (e.g., wheelchair or bedside chair)?: A Lot Help needed to walk in hospital room?: A Lot Help needed climbing 3-5 steps with a railing? : A Lot 6 Click Score: 14    End of Session Equipment Utilized During Treatment: Gait belt;Oxygen Activity Tolerance: Patient limited by fatigue Patient left: in chair;with call bell/phone within reach;with chair alarm set Nurse Communication: Mobility status PT Visit Diagnosis: Unsteadiness on feet (R26.81);Muscle weakness (generalized) (M62.81);Difficulty in walking, not elsewhere classified (R26.2)    Time: 1287-8676 PT Time Calculation (min) (ACUTE  ONLY): 17 min   Charges:   PT Evaluation $PT Eval Low Complexity: [facility] Acute Rehabilitation Services Pager [phone] Office [phone]   Claretha Cooper 09/17/2020, 12:48 PM

## 2020-09-17 NOTE — Evaluation (Signed)
Clinical/Bedside Swallow Evaluation Patient Details  Name: Austin Davis MRN: [id number] Date of Birth: 07/12/1940  Today's Date: 09/17/2020 Time: SLP Start Time (ACUTE ONLY): 1607 SLP Stop Time (ACUTE ONLY): 1200 SLP Time Calculation (min) (ACUTE ONLY): 15 min  Past Medical History:  Past Medical History:  Diagnosis Date   CHF (congestive heart failure) ([place])    Coronary artery disease    GI bleed 2019   hospitalized at [facility] for one week   Headache    since childhood   Low blood sugar    since childhood, controlled by diet   Mitral regurgitation    Mitral valve prolapse    Osteopenia 2021   Paroxysmal atrial fibrillation ([place])    Past Surgical History:  Past Surgical History:  Procedure Laterality Date   COLONOSCOPY WITH PROPOFOL N/A 02/19/2017   Procedure: COLONOSCOPY WITH PROPOFOL;  Surgeon: Wilford Corner, MD;  Location: [facility] ENDOS[facility];  Service: Endoscopy;  Laterality: N/A;   laser eye surgery for retina detachment     MITRAL VALVE REPAIR  01/2003   monitor  02/05/2006   polyp removal     TONSILLECTOMY     tooth removal     as a teenager   HPI:  80 y.o. male with medical history significant for paroxysmal atrial fibrillation, mitral valve annuloplasty , PAD, history of GI bleed, essential tremor, OSA not able to tolerate CPAP and hyperlipidemia, progressive health declineand weight loss. Presented 09/16/2020 with increasing shortness of breath, tachycardic and slightly hypotensive and hypoxic with oxygen saturations below 90.  On initial ABG, he was acidotic with pH of 7.227 with PCO2 of 90.  He was started on BiPAP.  Chest x-ray was negative for infiltrates.  INR was 10.9   Assessment / Plan / Recommendation Clinical Impression  Patient presents with an oropharyngeal swallow that appears [facility]. No overt s/s aspiration or penetration were observed with straw sips of thin liquids, swallow initiation appeared timely, voice remained clear. Patient reported to SLP  that since injuring his back a couple years ago, he has been laying flat on back in bed and he feels that aggravated his reflux and ever since this occured, most foods/liquids dont taste good, leading to patient with very poor intake and unintentional weight loss. He admitted that initially, he welcomed the weight loss as he felt he was a little overweight but after getting down to about 140lbs (his reported ideal), he continued to lose weight. SLP is not suspecting an oral or pharyngeal dysphagia and no further ST f/u needed at this time. SLP Visit Diagnosis: Dysphagia, unspecified (R13.10)    Aspiration Risk  No limitations;Mild aspiration risk    Diet Recommendation Regular;Thin liquid   Liquid Administration via: Cup;Straw Medication Administration: Whole meds with liquid Supervision: Patient able to self feed Compensations: Minimize environmental distractions;Slow rate;Small sips/bites Postural Changes: Seated upright at 90 degrees    Other  Recommendations Oral Care Recommendations: Oral care BID   Follow up Recommendations None      Frequency and Duration   N/A         Prognosis   N/A     Swallow Study   General Date of Onset: 09/16/20 HPI: 80 y.o. male with medical history significant for paroxysmal atrial fibrillation, mitral valve annuloplasty , PAD, history of GI bleed, essential tremor, OSA not able to tolerate CPAP and hyperlipidemia, progressive health declineand weight loss. Presented 09/16/2020 with increasing shortness of breath, tachycardic and slightly hypotensive and hypoxic with oxygen saturations below 90.  On initial ABG, he was acidotic with pH of 7.227 with PCO2 of 90.  He was started on BiPAP.  Chest x-ray was negative for infiltrates.  INR was 10.9 Type of Study: Bedside Swallow Evaluation Previous Swallow Assessment: none found Diet Prior to this Study: Regular;Thin liquids Temperature Spikes Noted: No Respiratory Status: Room air History of Recent  Intubation: No Behavior/Cognition: Alert;Cooperative;Pleasant mood Oral Cavity Assessment: Within Functional Limits Oral Care Completed by SLP: Yes Oral Cavity - Dentition: Adequate natural dentition Vision: Functional for self-feeding Self-Feeding Abilities: Able to feed self Patient Positioning: Upright in bed Baseline Vocal Quality: Low vocal intensity Volitional Cough: Weak Volitional Swallow: Able to elicit    Oral/Motor/Sensory Function Overall Oral Motor/Sensory Function: Within functional limits   Ice Chips     Thin Liquid Thin Liquid: Within functional limits Presentation: Straw;Self Fed    Nectar Thick     Honey Thick     Puree Puree: Not tested   Solid     Solid: Not tested      Sonia Baller, MA, CCC-SLP Speech Therapy

## 2020-09-17 NOTE — Telephone Encounter (Signed)
I see my colleagues in the office have seen Austin Davis today. I know he will receive excellent care. I hope he feels better soon.

## 2020-09-17 NOTE — TOC Initial Note (Signed)
Transition of Care [facility]) - Initial/Assessment Note    Patient Details  Name: Austin Davis MRN: [id number] Date of Birth: 1940-11-12  Transition of Care [facility]) CM/SW Contact:    Leeroy Cha, RN Phone Number: 09/17/2020, 8:25 AM  Clinical Narrative:                 80 y.o. male with medical history significant for paroxysmal atrial fibrillation, mitral valve annuloplasty and maze in 2005 on Coumadin, PAD, history of GI bleed, essential tremor, OSA not on CPAP and hyperlipidemia who presents with increasing shortness of breath.  History taking was difficult due to patient being on BiPAP mask.  Patient reports he has been having progressive decline for at least a year.  He states that he went from being overweight now to being extremely thin.  He has very low appetite.  He used to be able to ambulate independently but now needs assistance of his girlfriend to get around the house due to weakness and increasing shortness of breath.  Per documentation, he had a sleep study done in October 2021 and was found to have OSA but did not receive his CPAP until July due to backorder.  However he has not been using it since it makes it hard for him to breathe.  He reports he has been having some fever but could not give me an objective temperature.  Has occasional cough without sputum.  Denies any history of tobacco use or any formal diagnosis of lung disorder. Reports he has been pan scanned by an endocrinology about 6-8 weeks ago but ruled out for malignancy.  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   ED Course: He was afebrile, tachycardic and intermittently hypotensive down to systolic of 90 and hypoxic down to 90.  On initial ABG, he was acidotic with pH of 7.227 with CO2 90, bicarb of 36.  He was initiated on BiPAP and after 2 hours had pH normal for CO2 of 51 and bicarb of 32.  Chest x-ray was negative. Normal CBC showed no leukocytosis, hemoglobin of 17.8 with hematocrit  55.1.  Creatinine stable 0.80.  BG of 126 3.  Troponin of 6.  INR noted to be significantly elevated at 10.9 with no signs of overt bleeding.  He was given 2.5 mg of vitamin K in the ED.  Hospitalist on-call for admission.   Review of Systems:  Constitutional: + Weight Change, No Fever ENT/Mouth: No sore throat, No Rhinorrhea Eyes: No Eye Pain, No Vision Changes Cardiovascular: No Chest Pain, + SOB, No PND, + Dyspnea on Exertion, Respiratory: + Cough, No Sputum Gastrointestinal: No Nausea, No Vomiting, No Diarrhea, , No Pain Genitourinary: no Urinary Incontinence Musculoskeletal: No Arthralgias, No Myalgias Skin: No Skin Lesions, No Pruritus, Neuro: +Weakness, No Numbness Psych: + Anxiety/Panic, No Depression, + decrease appetite Heme/Lymph: No Bruising, No Bleeding  TOC PLAN OF CARE Will following for toc  and dme needs-home o2 if needed. Following for progression.  Expected Discharge Plan: [place] Barriers to Discharge: Continued Medical Work up   Patient Goals and CMS Choice        Expected Discharge Plan and Services Expected Discharge Plan: [place]   Discharge Planning Services: CM Consult   Living arrangements for the past 2 months: [place]  Prior Living Arrangements/Services Living arrangements for the past 2 months: [place] with:: Self                Criminal Activity/Legal Involvement Pertinent to Current Situation/Hospitalization: No - Comment as needed  Activities of Daily Living Home Assistive Devices/Equipment: None ADL Screening (condition at time of admission) Patient's cognitive ability adequate to safely complete daily activities?: Yes Is the patient deaf or have difficulty hearing?: No Does the patient have difficulty seeing, even when wearing glasses/contacts?: No Does the patient have difficulty concentrating, remembering, or making  decisions?: No Patient able to express need for assistance with ADLs?: Yes Does the patient have difficulty dressing or bathing?: Yes Independently performs ADLs?: No Does the patient have difficulty walking or climbing stairs?: Yes Weakness of Legs: Both Weakness of Arms/Hands: Both  Permission Sought/Granted                  Emotional Assessment Appearance:: Appears stated age     Orientation: : Oriented to Situation, Oriented to  Time, Oriented to Place, Oriented to Self Alcohol / Substance Use: Not Applicable Psych Involvement: No (comment)  Admission diagnosis:  SOB (shortness of breath) [R06.02] Hypoxia [R09.02] Supratherapeutic INR [R79.1] Acute on chronic respiratory failure with hypoxia and hypercapnia (HCC) [R83.09, J96.22] Patient Active Problem List   Diagnosis Date Noted   Acute on chronic respiratory failure with hypoxia and hypercapnia ([place]) 09/16/2020   Failure to thrive in adult 09/16/2020   AF (paroxysmal atrial fibrillation) ([place]) 09/16/2020   Stage 1 skin ulcer of sacral region ([place]) 09/16/2020   Hypotension 06/30/2020   GIB (gastrointestinal bleeding) 02/17/2017   Rectal bleeding 02/16/2017   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   Essential tremor 02/19/2015   Status post mitral valve annuloplasty and MAZE 2005 12/12/2014   HLD (hyperlipidemia) 05/05/2013   DOE (dyspnea on exertion) 04/22/2013   Fatigue 04/22/2013   Atrial fibrillation ([place]) 05/08/2012   PCP:  [name], MD Pharmacy:   [id number] [address], [place] [address] Phone: [phone] Fax: [phone]     Social Determinants of Health (SDOH) Interventions    Readmission Risk Interventions No flowsheet data found.

## 2020-09-17 NOTE — Progress Notes (Signed)
Date and time results received: 09/17/20 @ 0500  Test: ABG Critical Value: pH 7.62  Name of Provider Notified: [place] NP  Orders Received? Or Actions Taken?: Awaiting orders.

## 2020-09-17 NOTE — Progress Notes (Signed)
Patient ID: Austin Davis, male   DOB: March 20, 1940, 80 y.o.   MRN: [id number]  PROGRESS NOTE    Austin Davis  STM:[id number] DOB: [birth year] DOA: 09/16/2020 PCP: [name], MD   Brief Narrative:  80 y.o. male with medical history significant for paroxysmal atrial fibrillation, mitral valve annuloplasty and maze procedure in 2005 on Coumadin, PAD, history of GI bleed, essential tremor, OSA not able to tolerate CPAP and hyperlipidemia, progressive health decline along with weight loss over the last year presented with increasing shortness of breath.  On presentation, he was tachycardic and slightly hypotensive and hypoxic with oxygen saturations below 90.  On initial ABG, he was acidotic with pH of 7.227 with PCO2 of 90.  He was started on BiPAP.  Chest x-ray was negative for infiltrates.  INR was 10.9 with no overt bleeding.  He was given vitamin K in the ED.  Assessment & Plan:   Acute hypoxic and hypercarbic respite failure  -Patient was hypoxic and hypercapnic on presentation.  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 -Chest x-ray negative for infiltrates.  COVID and influenza testing negative on presentation.  Required BiPAP on presentation. -I have consulted pulmonary.  Follow recommendations. -Follow 2D echo.  Hypotension -Blood pressure on the lower side but stable.  Continue midodrine.  Normal cortisol level this morning   Failure to thrive/generalized weakness -Nutrition consult.  PT eval. -had work up with endocrinology in June 2022 and had PET scan showing no evidence of parganglioma with physiologic activity within the adrenal glands. Unfortunately no other documentation found in Epic. Recommend f/u with endocrinology outpatient  -Endocrine consultation for goals of care discussion  Supratherapeutic INR/history of mitral annuloplasty Paroxysmal A. fib status post maze procedure -INR 10.9 on presentation.  Status post  vitamin K given in the ED.  INR 6.4 today.  Hold Coumadin today as well.  Consult pharmacy. -Last echo showed EF of 45 to 50% with grade 1 diastolic dysfunction -Outpatient follow-up with cardiology.  Documented to have intolerance to beta-blocker in the past.  Intermittently tachycardic.  Essential tremor -Followed by neurology.  Could not tolerate beta-blockers in the past.  Apparently declined gabapentin -Continue as needed as needed Xanax.  Outpatient follow-up with PCP/neurology  Right subconjunctival hemorrhage -Continue to monitor  Stage I pressure sacral ulcer: Present on admission -Continue local wound care  DVT prophylaxis: SCDs Code Status: Full Family Communication: None at bedside Disposition Plan: Status is: Inpatient  Remains inpatient appropriate because:Inpatient level of care appropriate due to severity of illness  Dispo: The patient is from: Home              Anticipated d/c is to: Home              Patient currently is not medically stable to d/c.   Difficult to place patient No   Consultants: PCCM  Procedures: None  Antimicrobials: None   Subjective: Patient seen and examined at bedside.  Feels almost the same; still short of breath with exertion.  Denies chest pain, nausea or vomiting.  Objective: Vitals:   09/17/20 0619 09/17/20 0700 09/17/20 0719 09/17/20 0800  BP:  119/62  94/64  Pulse: (!) 108 (!) 110  (!) 105  Resp: (!) 28 (!) 24  (!) 28  Temp:    98.1 F (36.7 C)  TempSrc:    Oral  SpO2: 100% 100% 100% 100%  Weight:      Height:  Intake/Output Summary (Last 24 hours) at 09/17/2020 1011 Last data filed at 09/17/2020 0441 Gross per 24 hour  Intake 1711.65 ml  Output 675 ml  Net 1036.65 ml   Filed Weights   09/16/20 1450  Weight: 45.4 kg    Examination:  General exam: No distress.  Extremely thinly built and looks chronically ill and deconditioned.  On 2 L oxygen by nasal cannula. Respiratory system: Bilateral decreased  breath sounds at bases with scattered crackles and tachypnea Cardiovascular system: S1 & S2 heard; tachycardic  gastrointestinal system: Abdomen is nondistended, soft and nontender. Normal bowel sounds heard. Extremities: No cyanosis, clubbing, edema  Central nervous system: Alert and oriented. No focal neurological deficits. Moving extremities Skin: No rashes, lesions or ulcers Psychiatry: Slow to respond.  Affect is mostly flat   Data Reviewed: I have personally reviewed following labs and imaging studies  CBC: Recent Labs  Lab 09/16/20 1512 09/17/20 0439  WBC 8.5 7.3  NEUTROABS 7.0  --   HGB 17.8* 13.5  HCT 55.1* 40.5  MCV 98.2 97.4  PLT 166 638*   Basic Metabolic Panel: Recent Labs  Lab 09/16/20 1512 09/17/20 0439  NA 140 138  K 4.5 4.5  CL 91* 99  CO2 36* 27  GLUCOSE 123* 80  BUN 34* 31*  CREATININE 0.80 0.81  CALCIUM 9.4 8.5*   GFR: Estimated Creatinine Clearance: 47.5 mL/min (by C-G formula based on SCr of 0.81 mg/dL). Liver Function Tests: Recent Labs  Lab 09/16/20 1512  AST 34  ALT 43  ALKPHOS 46  BILITOT 1.4*  PROT 6.7  ALBUMIN 4.4   Recent Labs  Lab 09/16/20 1513  LIPASE 36   No results for input(s): AMMONIA in the last 168 hours. Coagulation Profile: Recent Labs  Lab 09/16/20 1513 09/17/20 0439  INR 10.9* 6.4*   Cardiac Enzymes: No results for input(s): CKTOTAL, CKMB, CKMBINDEX, TROPONINI in the last 168 hours. BNP (last 3 results) No results for input(s): PROBNP in the last 8760 hours. HbA1C: No results for input(s): HGBA1C in the last 72 hours. CBG: No results for input(s): GLUCAP in the last 168 hours. Lipid Profile: No results for input(s): CHOL, HDL, LDLCALC, TRIG, CHOLHDL, LDLDIRECT in the last 72 hours. Thyroid Function Tests: Recent Labs    09/16/20 1512  TSH 1.572  FREET4 0.89   Anemia Panel: No results for input(s): VITAMINB12, FOLATE, FERRITIN, TIBC, IRON, RETICCTPCT in the last 72 hours. Sepsis Labs: No results  for input(s): PROCALCITON, LATICACIDVEN in the last 168 hours.  Recent Results (from the past 240 hour(s))  Resp Panel by RT-PCR (Flu A&B, Covid) Nasopharyngeal Swab     Status: None   Collection Time: 09/16/20  3:22 PM   Specimen: Nasopharyngeal Swab; Nasopharyngeal(NP) swabs in vial transport medium  Result Value Ref Range Status   SARS Coronavirus 2 by RT PCR NEGATIVE NEGATIVE Final    Comment: (NOTE) SARS-CoV-2 target nucleic acids are NOT DETECTED.  The SARS-CoV-2 RNA is generally detectable in upper respiratory specimens during the acute phase of infection. The lowest concentration of SARS-CoV-2 viral copies this assay can detect is 138 copies/mL. A negative result does not preclude SARS-Cov-2 infection and should not be used as the sole basis for treatment or other patient management decisions. A negative result may occur with  improper specimen collection/handling, submission of specimen other than nasopharyngeal swab, presence of viral mutation(s) within the areas targeted by this assay, and inadequate number of viral copies(<138 copies/mL). A negative result must be combined with clinical  observations, patient history, and epidemiological information. The expected result is Negative.  Fact Sheet for Patients:  EntrepreneurPulse.com.au  Fact Sheet for Healthcare Providers:  IncredibleEmployment.be  This test is no t yet approved or cleared by the [place] FDA and  has been authorized for detection and/or diagnosis of SARS-CoV-2 by FDA under an Emergency Use Authorization (EUA). This EUA will remain  in effect (meaning this test can be used) for the duration of the COVID-19 declaration under Section 564(b)(1) of the Act, 21 U.S.C.section 360bbb-3(b)(1), unless the authorization is terminated  or revoked sooner.       Influenza A by PCR NEGATIVE NEGATIVE Final   Influenza B by PCR NEGATIVE NEGATIVE Final    Comment: (NOTE) The  Xpert Xpress SARS-CoV-2/FLU/RSV plus assay is intended as an aid in the diagnosis of influenza from Nasopharyngeal swab specimens and should not be used as a sole basis for treatment. Nasal washings and aspirates are unacceptable for Xpert Xpress SARS-CoV-2/FLU/RSV testing.  Fact Sheet for Patients: EntrepreneurPulse.com.au  Fact Sheet for Healthcare Providers: IncredibleEmployment.be  This test is not yet approved or cleared by the [place] FDA and has been authorized for detection and/or diagnosis of SARS-CoV-2 by FDA under an Emergency Use Authorization (EUA). This EUA will remain in effect (meaning this test can be used) for the duration of the COVID-19 declaration under Section 564(b)(1) of the Act, 21 U.S.C. section 360bbb-3(b)(1), unless the authorization is terminated or revoked.  Performed at [facility], [place] [address], [place], [place] [address]   MRSA Next Gen by PCR, Nasal     Status: None   Collection Time: 09/16/20  8:23 PM   Specimen: Nasal Mucosa; Nasal Swab  Result Value Ref Range Status   MRSA by PCR Next Gen NOT DETECTED NOT DETECTED Final    Comment: (NOTE) The GeneXpert MRSA Assay (FDA approved for NASAL specimens only), is one component of a comprehensive MRSA colonization surveillance program. It is not intended to diagnose MRSA infection nor to guide or monitor treatment for MRSA infections. Test performance is not FDA approved in patients less than 74 years old. Performed at [facility], [place] [address] [place], [place] [address]          Radiology Studies: DG Chest 2 View  Result Date: 09/16/2020 CLINICAL DATA:  sob EXAM: CHEST - 2 VIEW COMPARISON:  June 30, 2020 FINDINGS: The cardiomediastinal silhouette is unchanged in contour.Tortuous thoracic aorta. Status post median sternotomy. No pleural effusion. No pneumothorax. No acute pleuroparenchymal abnormality.  Visualized abdomen is unremarkable. Compression fracture deformity of the mid to inferior thoracic spine, unchanged. IMPRESSION: No acute cardiopulmonary abnormality. Electronically Signed   By: Valentino Saxon M.D.   On: 09/16/2020 15:35        Scheduled Meds:  ascorbic acid  500 mg Oral Daily   chlorhexidine  15 mL Mouth Rinse BID   Chlorhexidine Gluconate Cloth  6 each Topical Q0600   cholecalciferol  2,000 Units Oral Daily   feeding supplement  1 Container Oral TID BM   ipratropium-albuterol  3 mL Nebulization Q4H   mouth rinse  15 mL Mouth Rinse q12n4p   midodrine  5 mg Oral TID WC   omega-3 acid ethyl esters  1 g Oral Daily   Continuous Infusions:        Aline August, MD [facility] 09/17/2020, 10:11 AM

## 2020-09-18 ENCOUNTER — Inpatient Hospital Stay (HOSPITAL_COMMUNITY): Payer: Medicare Other

## 2020-09-18 DIAGNOSIS — J9622 Acute and chronic respiratory failure with hypercapnia: Secondary | ICD-10-CM | POA: Diagnosis not present

## 2020-09-18 DIAGNOSIS — I480 Paroxysmal atrial fibrillation: Secondary | ICD-10-CM | POA: Diagnosis not present

## 2020-09-18 DIAGNOSIS — J9621 Acute and chronic respiratory failure with hypoxia: Secondary | ICD-10-CM | POA: Diagnosis not present

## 2020-09-18 DIAGNOSIS — E43 Unspecified severe protein-calorie malnutrition: Secondary | ICD-10-CM | POA: Diagnosis not present

## 2020-09-18 DIAGNOSIS — R627 Adult failure to thrive: Secondary | ICD-10-CM | POA: Diagnosis not present

## 2020-09-18 DIAGNOSIS — G250 Essential tremor: Secondary | ICD-10-CM | POA: Diagnosis not present

## 2020-09-18 LAB — COMPREHENSIVE METABOLIC PANEL
ALT: 35 U/L (ref 0–44)
AST: 25 U/L (ref 15–41)
Albumin: 3.2 g/dL — ABNORMAL LOW (ref 3.5–5.0)
Alkaline Phosphatase: 37 U/L — ABNORMAL LOW (ref 38–126)
Anion gap: 4 — ABNORMAL LOW (ref 5–15)
BUN: 25 mg/dL — ABNORMAL HIGH (ref 8–23)
CO2: 36 mmol/L — ABNORMAL HIGH (ref 22–32)
Calcium: 8.6 mg/dL — ABNORMAL LOW (ref 8.9–10.3)
Chloride: 100 mmol/L (ref 98–111)
Creatinine, Ser: 0.55 mg/dL — ABNORMAL LOW (ref 0.61–1.24)
GFR, Estimated: >60 mL/min (ref >60)
Glucose, Bld: 112 mg/dL — ABNORMAL HIGH (ref 70–99)
Potassium: 4.3 mmol/L (ref 3.5–5.1)
Sodium: 140 mmol/L (ref 135–145)
Total Bilirubin: 0.9 mg/dL (ref 0.3–1.2)
Total Protein: 4.9 g/dL — ABNORMAL LOW (ref 6.5–8.1)

## 2020-09-18 LAB — CBC WITH DIFFERENTIAL/PLATELET
Abs Immature Granulocytes: 0.02 10*3/uL (ref 0.00–0.07)
Basophils Absolute: 0 10*3/uL (ref 0.0–0.1)
Basophils Relative: 0 %
Eosinophils Absolute: 0 10*3/uL (ref 0.0–0.5)
Eosinophils Relative: 0 %
HCT: 41.1 % (ref 39.0–52.0)
Hemoglobin: 12.9 g/dL — ABNORMAL LOW (ref 13.0–17.0)
Immature Granulocytes: 0 %
Lymphocytes Relative: 9 %
Lymphs Abs: 0.7 10*3/uL (ref 0.7–4.0)
MCH: 31.9 pg (ref 26.0–34.0)
MCHC: 31.4 g/dL (ref 30.0–36.0)
MCV: 101.7 fL — ABNORMAL HIGH (ref 80.0–100.0)
Monocytes Absolute: 0.9 10*3/uL (ref 0.1–1.0)
Monocytes Relative: 11 %
Neutro Abs: 6.3 10*3/uL (ref 1.7–7.7)
Neutrophils Relative %: 80 %
Platelets: 118 10*3/uL — ABNORMAL LOW (ref 150–400)
RBC: 4.04 MIL/uL — ABNORMAL LOW (ref 4.22–5.81)
RDW: 14.3 % (ref 11.5–15.5)
WBC: 7.9 10*3/uL (ref 4.0–10.5)
nRBC: 0 % (ref 0.0–0.2)

## 2020-09-18 LAB — VITAMIN B12: Vitamin B-12: 821 pg/mL (ref 180–914)

## 2020-09-18 LAB — PROTIME-INR
INR: 2.3 — ABNORMAL HIGH (ref 0.8–1.2)
Prothrombin Time: 25.5 seconds — ABNORMAL HIGH (ref 11.4–15.2)

## 2020-09-18 LAB — MAGNESIUM: Magnesium: 2.3 mg/dL (ref 1.7–2.4)

## 2020-09-18 LAB — RPR: RPR Ser Ql: NONREACTIVE

## 2020-09-18 LAB — TSH: TSH: 1.538 u[IU]/mL (ref 0.350–4.500)

## 2020-09-18 IMAGING — CT CT CHEST W/O CM
2 of 4 series · 15 of 36 positions shown, 18 images · non-contrast
Comparison: None.

CLINICAL DATA: Respiratory failure and uncontrollable tremors.

EXAM:
CT CHEST WITHOUT CONTRAST
TECHNIQUE: Multidetector CT imaging of the chest was performed following the
standard protocol without IV contrast.

[Series 2: thorax · axial · 0.70mm/px · z∈[+22,+262]mm · 12 of 142 slices shown, 15 images]
[im 11/142  mediastinal]
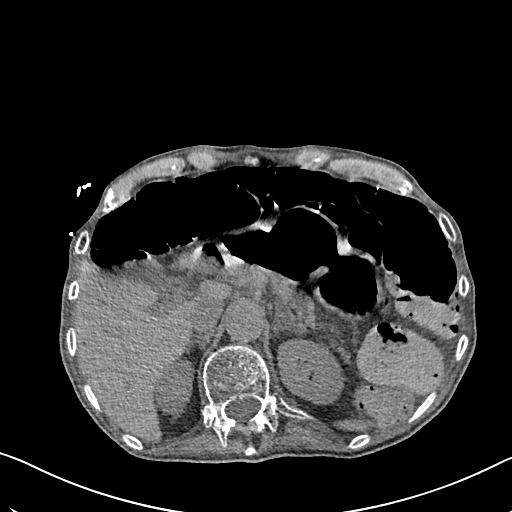
[im 11/142  lung]
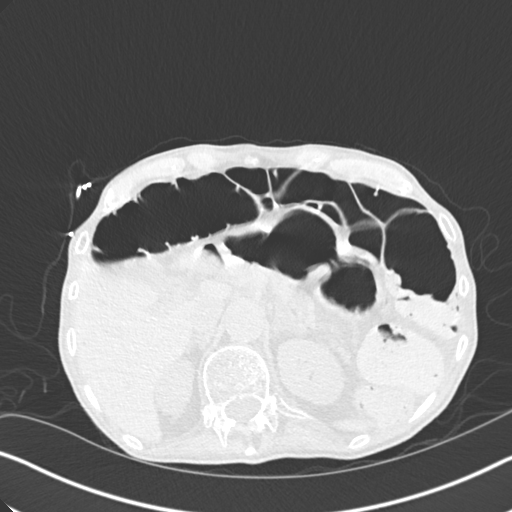
[im 22/142  lung]
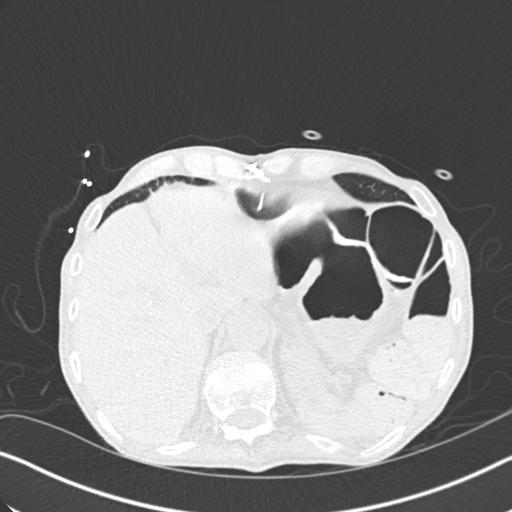
[im 33/142  lung]
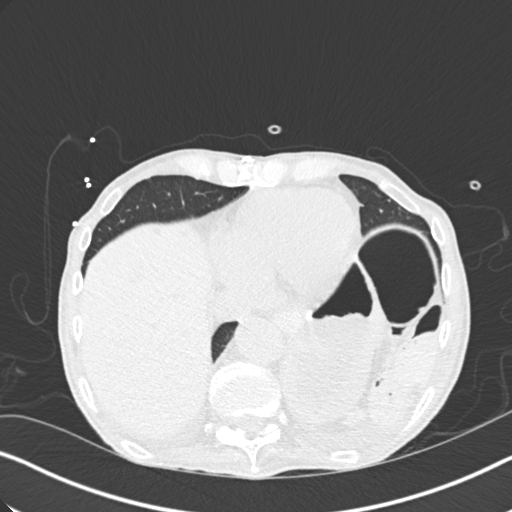
[im 44/142  lung]
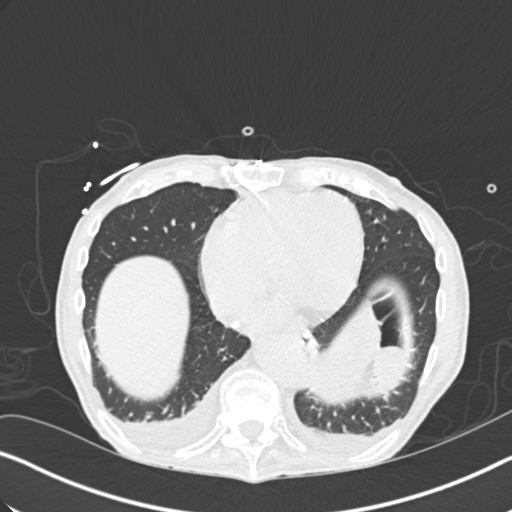
[im 55/142  mediastinal]
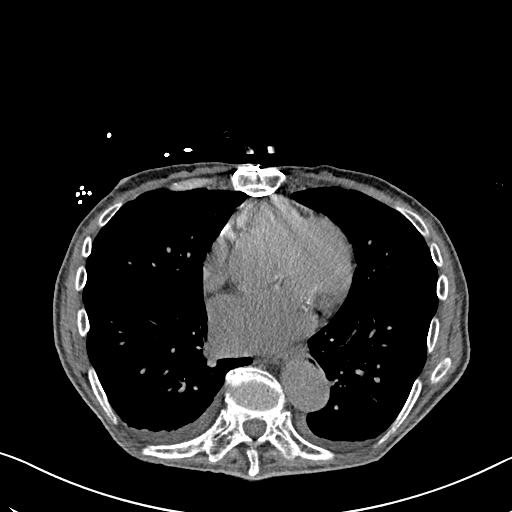
[im 55/142  lung]
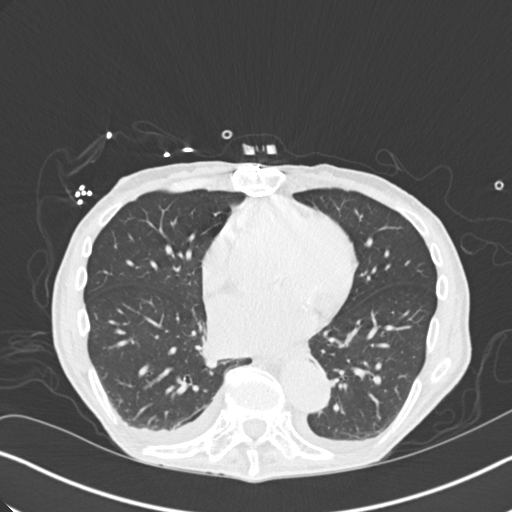
[im 66/142  lung]
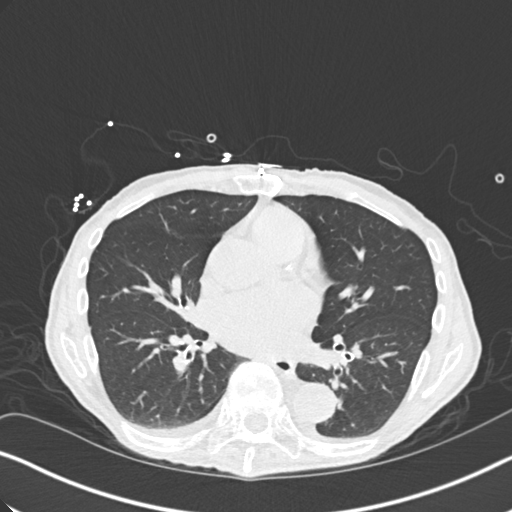
[im 76/142  lung]
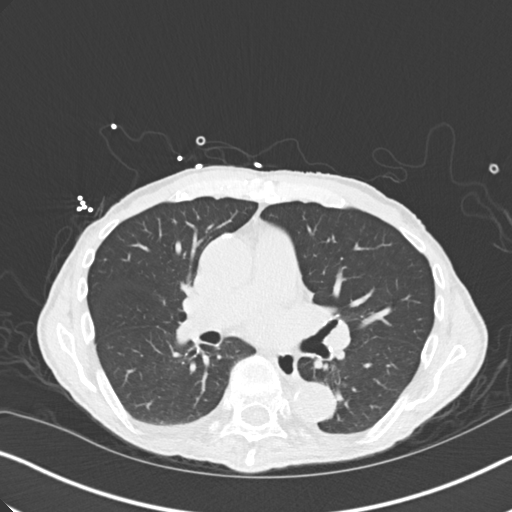
[im 87/142  lung]
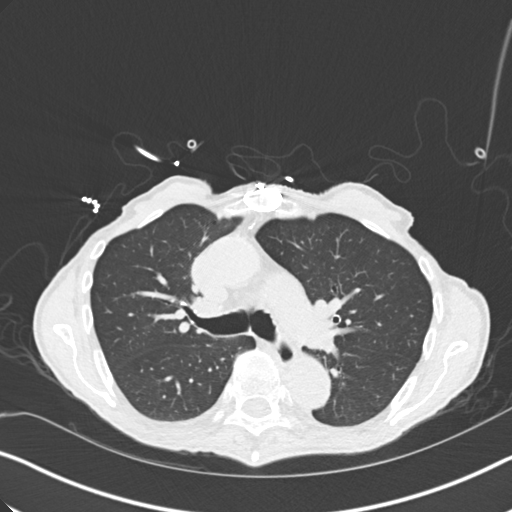
[im 98/142  mediastinal]
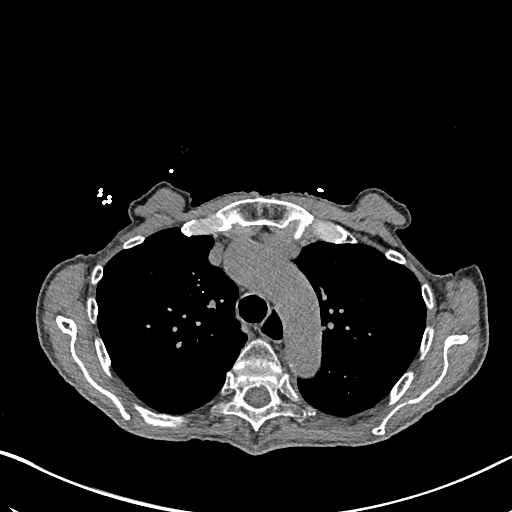
[im 98/142  lung]
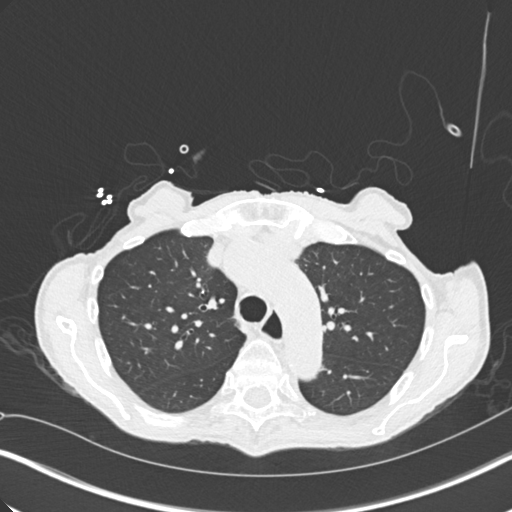
[im 109/142  lung]
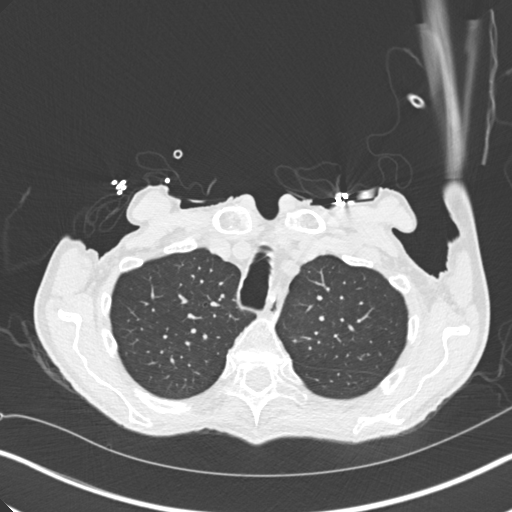
[im 120/142  lung]
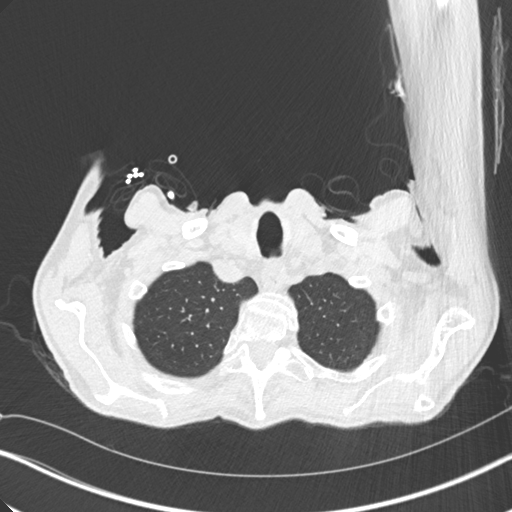
[im 131/142  lung]
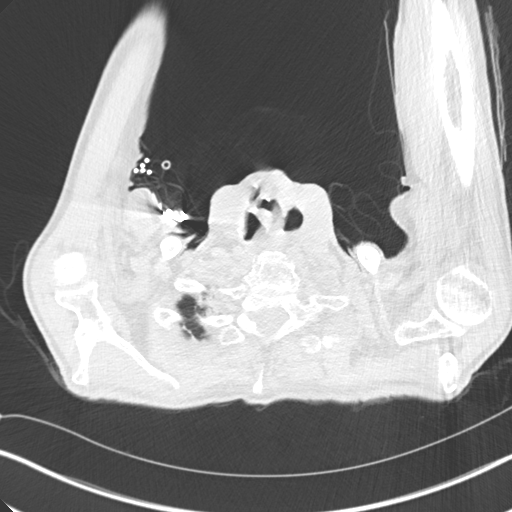

[Series 5: coronal · coronal · 0.59mm/px · 3 of 117 slices shown]
[im 24/117  lung]
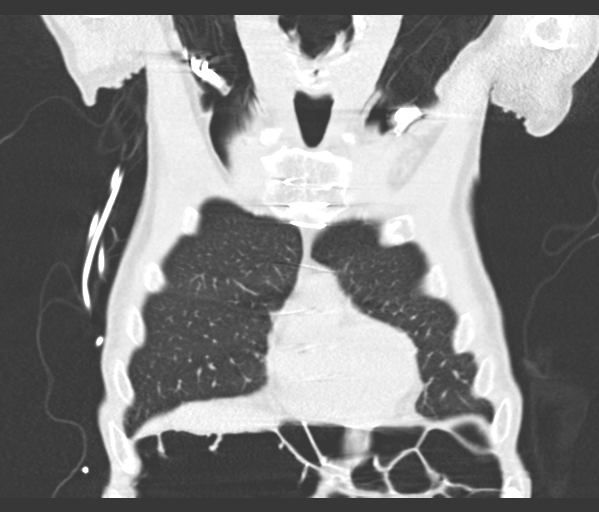
[im 47/117  lung]
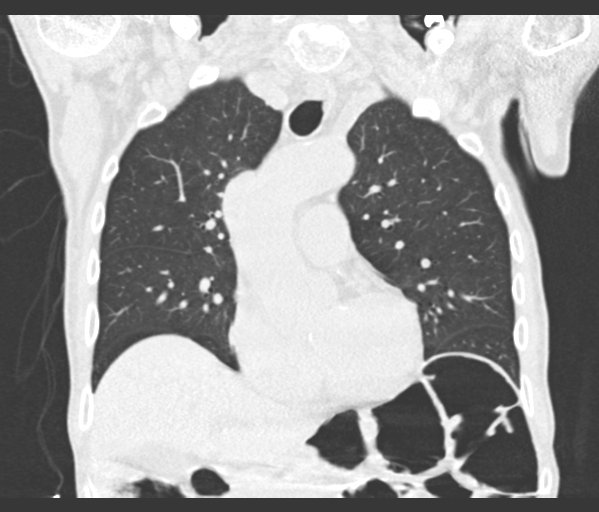
[im 70/117  lung]
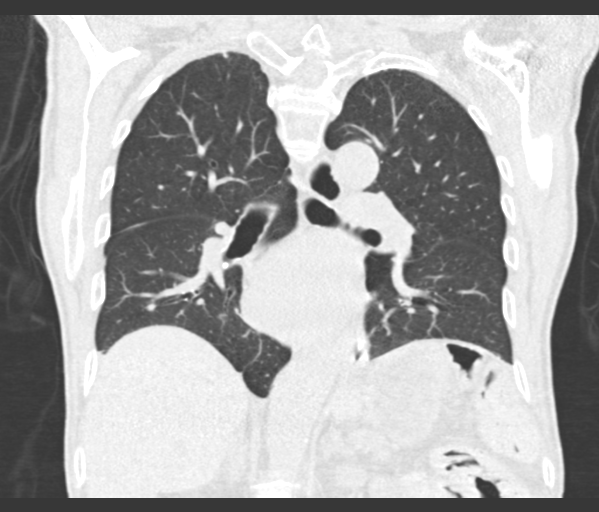

[15 of 36 positions shown; findings below may reference images not displayed]

FINDINGS: Cardiovascular: There is mild calcification of the aortic arch.
Normal heart size with moderate to marked severity coronary artery
calcification. No pericardial effusion.

Mediastinum/Nodes: No enlarged mediastinal or axillary lymph nodes.
Thyroid gland, trachea, and esophagus demonstrate no significant
findings.

Lungs/Pleura: Mild scarring and/or atelectasis is seen within the
bilateral apices and posterior aspects of the bilateral lung bases.

Small bilateral pleural effusions are noted.

No pneumothorax is identified.

Upper Abdomen: No acute abnormality.

Musculoskeletal: Multiple sternal wires are present.

A chronic compression fracture deformity is seen at the level of T6
and T10.
IMPRESSION: 1. Small bilateral pleural effusions.
2. Mild biapical and mild bibasilar scarring and/or atelectasis.
3. Moderate to marked severity coronary artery calcification.
4. Chronic compression fracture deformity at the level of T6 and
T10.
5. Aortic atherosclerosis.

Aortic Atherosclerosis ([K6]-[K6]).

## 2020-09-18 MED ORDER — DM-GUAIFENESIN ER 30-600 MG PO TB12
1.0000 | ORAL_TABLET | Freq: Two times a day (BID) | ORAL | Status: DC
  Administered 2020-09-18 – 2020-09-29 (×21): 1 via ORAL
  Filled 2020-09-18 (×21): qty 1

## 2020-09-18 MED ORDER — WARFARIN SODIUM 2.5 MG PO TABS
2.5000 mg | ORAL_TABLET | Freq: Once | ORAL | Status: AC
  Administered 2020-09-18: 2.5 mg via ORAL
  Filled 2020-09-18: qty 1

## 2020-09-18 MED ORDER — WARFARIN - PHARMACIST DOSING INPATIENT: Freq: Every day | Status: DC

## 2020-09-18 NOTE — Progress Notes (Signed)
ANTICOAGULATION CONSULT NOTE - Initial Consult  Pharmacy Consult for warfarin Indication: atrial fibrillation  Allergies  Allergen Reactions   Prednisone Other (See Comments)    Makes skin crawl, rapid HR   Cortisone Palpitations    Patient Measurements: Height: 5\' 7"  (170.2 cm) Weight: 45.4 kg (100 lb) IBW/kg (Calculated) : 66.1 Heparin Dosing Weight:   Vital Signs: Temp: 98.3 F (36.8 C) (08/30 1200) Temp Source: Oral (08/30 1200) BP: 146/93 (08/30 1300) Pulse Rate: 104 (08/30 1300)  Labs: Recent Labs    09/16/20 1512 09/16/20 1513 09/16/20 1754 09/17/20 0439 09/18/20 0235  HGB 17.8*  --   --  13.5 12.9*  HCT 55.1*  --   --  40.5 41.1  PLT 166  --   --  104* 118*  LABPROT  --  85.1*  --  56.0* 25.5*  INR  --  10.9*  --  6.4* 2.3*  CREATININE 0.80  --   --  0.81 0.55*  TROPONINIHS 6  --  6  --   --     Estimated Creatinine Clearance: 48.1 mL/min (A) (by C-G formula based on SCr of 0.55 mg/dL (L)).   Medical History: Past Medical History:  Diagnosis Date   CHF (congestive heart failure) ([place])    Coronary artery disease    GI bleed 2019   hospital[facility] one [facility]   Headache    since childhood   Low blood sugar    since childhood, controlled by diet   Mitral regurgitation    Mitral valve prolapse    Osteopenia 2021   Paroxysmal atrial fibrillation (HCC)     Medications:  It is unclear what the pt's exact home regimen is. Attempted to call [facility] multiple times today to clarify what pt's dose but was not able to be given pt's current dose.   There are two listed doses on med rec. - Warfarin 1 mg PO daily  - Warfarin 5 mg - 1/2 to 1 tab daily as directed by Coumadin clinic.    Assessment: Pt admitted with Acute hypoxic and hypercarbic respite failure . Pt admitted with an INR 10.9. Vitamin K 2.5 mg PO x1 given   Today, 09/18/20 INR 2.3 Hgb 12.9, plt 118    Goal of Therapy:  INR 2-3 Monitor platelets by  anticoagulation protocol: Yes   Plan:  Warfarin 2.5 mg PO x 1  Daily INR  Monitor for signs and symptoms of bleeding   Royetta Asal, PharmD, BCPS 09/18/2020 2:29 PM

## 2020-09-18 NOTE — Progress Notes (Signed)
Pt noted to be anxious and has requested to be removed off CPAP. Pt placed on 2l [place] at this time

## 2020-09-18 NOTE — Progress Notes (Signed)
NAME:  Austin Davis, MRN:  [id number], DOB:  [birth year], LOS: 2 ADMISSION DATE:  09/16/2020 CONSULTATION DATE:  09/17/2020 REFERRING MD:  Starla Link - [facility] CHIEF COMPLAINT:  Hypercarbic respiratory failure   History of Present Illness:  Austin Davis is seen in consultation at the request of Dr. Starla Link for recommendations on further evaluation and management of hypercarbic respiratory failure.  Patient is a 80 year old man who presented to [facility] ED 8/29 with SOB, dyspnea and fatigue/malaise, worsening over the last several weeks prior to admission. PMHx significant for HLD, PAF, MVP s/p MV annuloplasty (2005, on Coumadin), PAD, CAD, OSA (on CPAP, noncompliant), history of GIB (2019).  On admission, patient reported general decline of his wellness over the past 1-2 years.  He tells me that he locked down for COVID and noted some weight gain from lack of exercise.  He started trying to eat a healthier diet and initially noted about 10 pounds of weight loss, but then noted he could no longer complete tasks he used to do with ease such as mowing the lawn without a significant shortness of breath and then continued to lose weight without effort (approximately 75 pounds) with progressive generalized weakness, malaise, lack of appetite, noting that his favorite foods taste strange or do not taste appetizing anymore with exertional shortness of breath.  He noted a very bad respiratory illness early 2019 that he never had a good etiology for.  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  However his recent PET scan was negative.   He was diagnosed with OSA 10/2019 and CPAP was ordered but not delivered until 07/2020 due to supply issues; he reports significant issues with mask fit/discomfort and associated claustrophobia, especially while lying flat. As  a result, he has only attempted to wear his mask a handful of times since receiving his machine.  Pertinent Medical History:   Past Medical History:  Diagnosis Date   CHF (congestive heart failure) ([place])    Coronary artery disease    GI bleed 2019   hospitalized at [facility] for one week   Headache    since childhood   Low blood sugar    since childhood, controlled by diet   Mitral regurgitation    Mitral valve prolapse    Osteopenia 2021   Paroxysmal atrial fibrillation ([place])    Significant Hospital Events: Including procedures, antibiotic start and stop dates in addition to other pertinent events   8/28 - Presented to [facility] ED for SOB, dyspnea. Admitted for hypercarbic RF. 8/29 - PCCM consulted for hypercarbic RF requiring BiPAP. Nutrition consult.  Interim History / Subjective:  PCCM consulted  Objective:  Blood pressure (!) 101/56, pulse 87, temperature 98.4 F (36.9 C), temperature source Oral, resp. rate (!) 22, height 5\' 7"  (1.702 m), weight 45.4 kg, SpO2 95 %.    FiO2 (%):  [28 %] 28 %   Intake/Output Summary (Last 24 hours) at 09/18/2020 1109 Last data filed at 09/18/2020 0000 Gross per 24 hour  Intake --  Output 600 ml  Net -600 ml    Filed Weights   09/16/20 1450  Weight: 45.4 kg   Physical Examination: General: Chronically ill-appearing elderly gentleman in NAD. Cachectic. HEENT: [place]/AT, R subconjunctival hemorrhage noted, PERRL, dry mucous membranes. Neuro: Awake, oriented x 4. Responds to verbal stimuli. Following commands  consistently. Moves all 4 extremities spontaneously. Strength 3/5 in all 4 extremities. CV: RRR, no m/g/r. PULM: Breathing even and mildly labored 2LNC, mildly tachypneic with speech. Lung fields with fair air movement bilaterally. GI: Soft, nontender, nondistended. Normoactive bowel sounds. Extremities: No LE edema noted. Skin: Very dry, warm to touch. Venous stasis changes noted to BLE.  Resolved Hospital Problem List:     Assessment & Plan:  This patient is seen in consultation at the request of Dr. Starla Link for recommendations on further evaluation and management of hypercarbic respiratory failure.  80 year old man who presented to [facility] ED 8/29 with SOB, dyspnea and fatigue/malaise, worsening over the last several weeks prior to admission. PMHx significant for HLD, PAF, MVP s/p MV annuloplasty (2005, on Coumadin), PAD, CAD, OSA (on CPAP, noncompliant), history of GIB (2019).  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  His profound weight loss and progressive exertional dyspnea is concerning P: -check Quantiferon-TB Gold for completeness, though CXR without acute findings -Check non-contrasted CT chest -history of possible mold exposure, check immunoglobulins to assess for immunodeficiency   - Continue BiPAP support QHS and PRN - Continue supplemental O2, wean for O2 sat > 90% - Pulmonary hygiene - Continue home Xanax PRN for NIV mask-associated claustrophobia - PT/OT consults, consider SLP - Agree with Nutrition consult, appreciate recs - Brief GOC discussion at bedside, patient continues to wish for full code status at this time - Remainder per Primary Team  Best Practice: (right click and "Reselect all SmartList Selections" daily)   Per Primary Team  Labs:  CBC: Recent Labs  Lab 09/16/20 1512 09/17/20 0439 09/18/20 0235  WBC 8.5 7.3 7.9  NEUTROABS 7.0  --  6.3  HGB 17.8* 13.5 12.9*  HCT 55.1* 40.5 41.1  MCV 98.2 97.4 101.7*  PLT 166 104* 118*    Basic Metabolic Panel: Recent Labs  Lab 09/16/20 1512 09/17/20 0439 09/17/20 1913 09/18/20 0235  NA 140 138  --  140  K 4.5 4.5  --  4.3  CL 91* 99  --  100  CO2 36* 27  --  36*  GLUCOSE 123* 80  --  112*  BUN 34* 31*  --  25*  CREATININE 0.80 0.81  --  0.55*  CALCIUM 9.4 8.5*  --  8.6*  MG  --   --   --  2.3   PHOS  --   --  2.7  --     GFR: Estimated Creatinine Clearance: 48.1 mL/min (A) (by C-G formula based on SCr of 0.55 mg/dL (L)). Recent Labs  Lab 09/16/20 1512 09/17/20 0439 09/18/20 0235  WBC 8.5 7.3 7.9    Liver Function Tests: Recent Labs  Lab 09/16/20 1512 09/18/20 0235  AST 34 25  ALT 43 35  ALKPHOS 46 37*  BILITOT 1.4* 0.9  PROT 6.7 4.9*  ALBUMIN 4.4 3.2*    Recent Labs  Lab 09/16/20 1513  LIPASE 36    No results for input(s): AMMONIA in the last 168 hours.  ABG:    Component Value Date/Time   PHART 7.621 (HH) 09/17/2020 0432   PCO2ART 23.7 (L) 09/17/2020 0432   PO2ART 132 (H) 09/17/2020 0432   HCO3 24.7 09/17/2020 0432   TCO2 30 09/25/2006 1353   O2SAT 99.4 09/17/2020 0432     Coagulation Profile: Recent Labs  Lab 09/16/20 1513 09/17/20 0439 09/18/20 0235  INR 10.9* 6.4* 2.3*    Cardiac  Enzymes: No results for input(s): CKTOTAL, CKMB, CKMBINDEX, TROPONINI in the last 168 hours.  HbA1C: Hgb A1c MFr Bld  Date/Time Value Ref Range Status  01/30/2015 11:15 AM 5.9 (H) <5.7 % Final    Comment:                                                                           According to the ADA Clinical Practice Recommendations for 2011, when HbA1c is used as a screening test:     >=6.5%   Diagnostic of Diabetes Mellitus            (if abnormal result is confirmed)   5.7-6.4%   Increased risk of developing Diabetes Mellitus   References:Diagnosis and Classification of Diabetes Mellitus,Diabetes OZHY,8657,84(ONGEX 1):S62-S69 and Standards of Medical Care in         Diabetes - 2011,Diabetes BMWU,1324,40 (Suppl 1):S11-S61.     04/26/2013 09:15 AM 6.1 (H) <5.7 % Final    Comment:                                                                           According to the ADA Clinical Practice Recommendations for 2011, when HbA1c is used as a screening test:     >=6.5%   Diagnostic of Diabetes Mellitus            (if abnormal result is  confirmed)   5.7-6.4%   Increased risk of developing Diabetes Mellitus   References:Diagnosis and Classification of Diabetes Mellitus,Diabetes NUUV,2536,64(QIHKV 1):S62-S69 and Standards of Medical Care in         Diabetes - 2011,Diabetes Care,2011,34 (Suppl 1):S11-S61.     CBG: No results for input(s): GLUCAP in the last 168 hours.  Review of Systems:   Review of systems completed with pertinent positives/negatives outlined in above HPI.  Past Medical History:  He,  has a past medical history of CHF (congestive heart failure) ([place]), Coronary artery disease, GI bleed (2019), Headache, Low blood sugar, Mitral regurgitation, Mitral valve prolapse, Osteopenia (2021), and Paroxysmal atrial fibrillation ([place]).   Surgical History:   Past Surgical History:  Procedure Laterality Date   COLONOSCOPY WITH PROPOFOL N/A 02/19/2017   Procedure: COLONOSCOPY WITH PROPOFOL;  Surgeon: Wilford Corner, MD;  Location: [facility] ENDOSCOPY;  Service: Endoscopy;  Laterality: N/A;   laser eye surgery for retina detachment     MITRAL VALVE REPAIR  01/2003   monitor  02/05/2006   polyp removal     TONSILLECTOMY     tooth removal     as a teenager    Social History:   reports that he has never smoked. He has never used smokeless tobacco. He reports current alcohol use. He reports that he does not use drugs.   Family History:  His family history includes Asthma in his daughter; CAD in his mother; Epilepsy in his son; Heart disease in his mother; Heart failure in his father, maternal grandmother, and paternal grandmother; Migraines  in his mother; Pneumonia in his maternal grandfather; Skin cancer in his father; Stroke in his paternal grandfather; Tremor in his father; Valvular heart disease in his father. There is no history of Parkinson's disease.   Allergies: Allergies  Allergen Reactions   Prednisone Other (See Comments)    Makes skin crawl, rapid HR   Cortisone Palpitations    Home Medications: Prior to  Admission medications   Medication Sig Start Date End Date Taking? Authorizing Provider  acetaminophen (TYLENOL) 500 MG tablet Take 500 mg by mouth every 6 (six) hours as needed for mild pain or headache.   Yes [provider]  ALPRAZolam (XANAX) 0.25 MG tablet Take 0.125 mg by mouth at bedtime as needed for anxiety.   Yes [provider]  Ascorbic Acid (VITAMIN C IMMUNE HEALTH PO) Take 500 mg by mouth.   Yes [provider]  B Complex Vitamins (VITAMIN B COMPLEX PO) Take 1 tablet by mouth daily.   Yes [provider]  Cholecalciferol (D3) 50 MCG (2000 UT) TABS Take 2,000 Units by mouth daily.   Yes [provider]  diphenhydramine-acetaminophen (TYLENOL PM) 25-500 MG TABS tablet Take 1 tablet by mouth at bedtime as needed.   Yes [provider]  Garlic 831 MG TABS Take 100 mg by mouth daily.   Yes [provider]  Magnesium 250 MG TABS Take 250 mg by mouth daily.   Yes [provider]  Omega-3 Fatty Acids (FISH OIL PO) Take 1 capsule by mouth daily.   Yes [provider]  warfarin (COUMADIN) 1 MG tablet Take 1 mg by mouth daily.   Yes [provider]  primidone (MYSOLINE) 50 MG tablet Start with 1/2 pill at bedtime(25mg ). If no side effects in 2 weeks, may increase to a whole tablet(50mg ) at bedtime otherwise continue with 1/2 pill(25mg ) Patient not taking: No sig reported 07/25/20   Melvenia Beam, MD  warfarin (COUMADIN) 5 MG tablet TAKE 1/2 TO 1 TABLET DAILY AS DIRECTED BY COUMADIN CLINIC. 09/17/20   Minus Breeding, MD    Critical care time: n/a      Otilio Carpen Ninnie Fein, PA-C [facility] See Amion for pager If no response to pager , please call [phone] until 7pm After 7:00 pm call Elink  [phone]

## 2020-09-18 NOTE — Progress Notes (Signed)
Patient ID: Austin Davis, male   DOB: Jul 23, 1940, 80 y.o.   MRN: [id number]  PROGRESS NOTE    Austin Davis  PPJ:[id number] DOB: 1940/03/12 DOA: 09/16/2020 PCP: Austin Cruel, MD   Brief Narrative:  80 y.o. male with medical history significant for paroxysmal atrial fibrillation, mitral valve annuloplasty and maze procedure in 2005 on Coumadin, PAD, history of GI bleed, essential tremor, OSA not able to tolerate CPAP and hyperlipidemia, progressive health decline along with weight loss over the last year presented with increasing shortness of breath.  On presentation, he was tachycardic and slightly hypotensive and hypoxic with oxygen saturations below 90.  On initial ABG, he was acidotic with pH of 7.227 with PCO2 of 90.  He was started on BiPAP.  Chest x-ray was negative for infiltrates.  INR was 10.9 with no overt bleeding.  He was given vitamin K in the ED.  Assessment & Plan:   Acute hypoxic and hypercarbic respite failure  -Patient was hypoxic and hypercapnic on presentation.  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 -Chest x-ray negative for infiltrates.  COVID and influenza testing negative on presentation.  Required BiPAP on presentation. -Pulmonary following: Recommend to continue nocturnal ventilation nightly. -Patient could not tolerate CPAP throughout the night last night -Echo showed EF of 45 to 50% with grade 1 diastolic dysfunction  Hypotension -Blood pressure still intermittently on the lower side.  Continue midodrine.  Normal cortisol level   Failure to thrive/generalized weakness/severe malnutrition -Follow nutrition recommendations.  PT recommends home and PT versus SNF.  TOC consult. -had work up with endocrinology in June 2022 and had PET scan showing no evidence of parganglioma with physiologic activity within the adrenal glands. Unfortunately no other documentation found in Epic. Recommend f/u with endocrinology  outpatient  -Endocrine consultation for goals of care discussion -Palliative care consultation for goals of care discussion is pending.  Supratherapeutic INR/history of mitral annuloplasty Paroxysmal A. fib status post maze procedure -INR 10.9 on presentation.  Status post vitamin K given in the ED.  INR 2.3 today.  Resume Coumadin.  Monitor daily INR.  Consult pharmacy. -Echo as above. -Outpatient follow-up with cardiology.  Documented to have intolerance to beta-blocker in the past.  Intermittently tachycardic.  Essential tremor -Followed by neurology.  Could not tolerate beta-blockers in the past.  Apparently declined gabapentin -Continue as needed as needed Xanax.  Outpatient follow-up with PCP/neurology  Right subconjunctival hemorrhage -Continue to monitor  Stage I pressure sacral ulcer: Present on admission -Continue local wound care  DVT prophylaxis: SCDs Code Status: Full Family Communication: None at bedside Disposition Plan: Status is: Inpatient  Remains inpatient appropriate because:Inpatient level of care appropriate due to severity of illness  Dispo: The patient is from: Home              Anticipated d/c is to: Home              Patient currently is not medically stable to d/c.   Difficult to place patient No   Consultants: PCCM/palliative care  Procedures: Echo  Antimicrobials: None   Subjective: Patient seen and examined at bedside.  No overnight fever, vomiting reported.  Breathing is slightly better.  Patient did not wear CPAP overnight as per nursing staff.  Still feels weak and tired.  Objective: Vitals:   09/17/20 2250 09/17/20 2300 09/18/20 0000 09/18/20 0400  BP: 133/78 107/66 (!) 86/48 (!) 119/58  Pulse: 99 98 87  98  Resp: (!) 27 (!) 26 (!) 30 (!) 25  Temp:   98.4 F (36.9 C)   TempSrc:   Oral   SpO2: 97% 97% 99% 91%  Weight:      Height:        Intake/Output Summary (Last 24 hours) at 09/18/2020 0727 Last data filed at 09/18/2020  0000 Gross per 24 hour  Intake --  Output 600 ml  Net -600 ml    Filed Weights   09/16/20 1450  Weight: 45.4 kg    Examination:  General exam: No acute distress.  Extremely thinly built and looks chronically ill and deconditioned.  Respiratory system: Decreased breath sounds at bases bilaterally with tachypnea and some crackles  cardiovascular system: Rate controlled, S1-S2 heard gastrointestinal system: Abdomen is distended slightly, soft and nontender.  Bowel sounds are heard  extremities: Trace lower extremity edema; no clubbing  Central nervous system: Awake, slow to respond.  No focal neurological deficits.  Moves extremities  skin: No obvious ecchymosis/rashes Psychiatry: Flat affect.  Intermittently gets anxious.  Data Reviewed: I have personally reviewed following labs and imaging studies  CBC: Recent Labs  Lab 09/16/20 1512 09/17/20 0439 09/18/20 0235  WBC 8.5 7.3 7.9  NEUTROABS 7.0  --  6.3  HGB 17.8* 13.5 12.9*  HCT 55.1* 40.5 41.1  MCV 98.2 97.4 101.7*  PLT 166 104* 118*    Basic Metabolic Panel: Recent Labs  Lab 09/16/20 1512 09/17/20 0439 09/17/20 1913 09/18/20 0235  NA 140 138  --  140  K 4.5 4.5  --  4.3  CL 91* 99  --  100  CO2 36* 27  --  36*  GLUCOSE 123* 80  --  112*  BUN 34* 31*  --  25*  CREATININE 0.80 0.81  --  0.55*  CALCIUM 9.4 8.5*  --  8.6*  MG  --   --   --  2.3  PHOS  --   --  2.7  --     GFR: Estimated Creatinine Clearance: 48.1 mL/min (A) (by C-G formula based on SCr of 0.55 mg/dL (L)). Liver Function Tests: Recent Labs  Lab 09/16/20 1512 09/18/20 0235  AST 34 25  ALT 43 35  ALKPHOS 46 37*  BILITOT 1.4* 0.9  PROT 6.7 4.9*  ALBUMIN 4.4 3.2*    Recent Labs  Lab 09/16/20 1513  LIPASE 36    No results for input(s): AMMONIA in the last 168 hours. Coagulation Profile: Recent Labs  Lab 09/16/20 1513 09/17/20 0439 09/18/20 0235  INR 10.9* 6.4* 2.3*    Cardiac Enzymes: No results for input(s): CKTOTAL,  CKMB, CKMBINDEX, TROPONINI in the last 168 hours. BNP (last 3 results) No results for input(s): PROBNP in the last 8760 hours. HbA1C: No results for input(s): HGBA1C in the last 72 hours. CBG: No results for input(s): GLUCAP in the last 168 hours. Lipid Profile: No results for input(s): CHOL, HDL, LDLCALC, TRIG, CHOLHDL, LDLDIRECT in the last 72 hours. Thyroid Function Tests: Recent Labs    09/16/20 1512 09/18/20 0235  TSH 1.572 1.538  FREET4 0.89  --     Anemia Panel: Recent Labs    09/18/20 0235  VITAMINB12 821   Sepsis Labs: No results for input(s): PROCALCITON, LATICACIDVEN in the last 168 hours.  Recent Results (from the past 240 hour(s))  Resp Panel by RT-PCR (Flu A&B, Covid) Nasopharyngeal Swab     Status: None   Collection Time: 09/16/20  3:22 PM   Specimen: Nasopharyngeal  Swab; Nasopharyngeal(NP) swabs in vial transport medium  Result Value Ref Range Status   SARS Coronavirus 2 by RT PCR NEGATIVE NEGATIVE Final    Comment: (NOTE) SARS-CoV-2 target nucleic acids are NOT DETECTED.  The SARS-CoV-2 RNA is generally detectable in upper respiratory specimens during the acute phase of infection. The lowest concentration of SARS-CoV-2 viral copies this assay can detect is 138 copies/mL. A negative result does not preclude SARS-Cov-2 infection and should not be used as the sole basis for treatment or other patient management decisions. A negative result may occur with  improper specimen collection/handling, submission of specimen other than nasopharyngeal swab, presence of viral mutation(s) within the areas targeted by this assay, and inadequate number of viral copies(<138 copies/mL). A negative result must be combined with clinical observations, patient history, and epidemiological information. The expected result is Negative.  Fact Sheet for Patients:  EntrepreneurPulse.com.au  Fact Sheet for Healthcare Providers:   IncredibleEmployment.be  This test is no t yet approved or cleared by the [place] FDA and  has been authorized for detection and/or diagnosis of SARS-CoV-2 by FDA under an Emergency Use Authorization (EUA). This EUA will remain  in effect (meaning this test can be used) for the duration of the COVID-19 declaration under Section 564(b)(1) of the Act, 21 U.S.C.section 360bbb-3(b)(1), unless the authorization is terminated  or revoked sooner.       Influenza A by PCR NEGATIVE NEGATIVE Final   Influenza B by PCR NEGATIVE NEGATIVE Final    Comment: (NOTE) The Xpert Xpress SARS-CoV-2/FLU/RSV plus assay is intended as an aid in the diagnosis of influenza from Nasopharyngeal swab specimens and should not be used as a sole basis for treatment. Nasal washings and aspirates are unacceptable for Xpert Xpress SARS-CoV-2/FLU/RSV testing.  Fact Sheet for Patients: EntrepreneurPulse.com.au  Fact Sheet for Healthcare Providers: IncredibleEmployment.be  This test is not yet approved or cleared by the [place] FDA and has been authorized for detection and/or diagnosis of SARS-CoV-2 by FDA under an Emergency Use Authorization (EUA). This EUA will remain in effect (meaning this test can be used) for the duration of the COVID-19 declaration under Section 564(b)(1) of the Act, 21 U.S.C. section 360bbb-3(b)(1), unless the authorization is terminated or revoked.  Performed at [facility], [place] [address], [place], [place] [address]   MRSA Next Gen by PCR, Nasal     Status: None   Collection Time: 09/16/20  8:23 PM   Specimen: Nasal Mucosa; Nasal Swab  Result Value Ref Range Status   MRSA by PCR Next Gen NOT DETECTED NOT DETECTED Final    Comment: (NOTE) The GeneXpert MRSA Assay (FDA approved for NASAL specimens only), is one component of a comprehensive MRSA colonization surveillance program. It is not intended  to diagnose MRSA infection nor to guide or monitor treatment for MRSA infections. Test performance is not FDA approved in patients less than 53 years old. Performed at [facility], [place] [address] [place], [place] [address]           Radiology Studies: DG Chest 2 View  Result Date: 09/16/2020 CLINICAL DATA:  sob EXAM: CHEST - 2 VIEW COMPARISON:  June 30, 2020 FINDINGS: The cardiomediastinal silhouette is unchanged in contour.Tortuous thoracic aorta. Status post median sternotomy. No pleural effusion. No pneumothorax. No acute pleuroparenchymal abnormality. Visualized abdomen is unremarkable. Compression fracture deformity of the mid to inferior thoracic spine, unchanged. IMPRESSION: No acute cardiopulmonary abnormality. Electronically Signed   By: Valentino Saxon M.D.  On: 09/16/2020 15:35   ECHOCARDIOGRAM COMPLETE  Result Date: 09/17/2020    ECHOCARDIOGRAM REPORT   Patient Name:   Austin Davis Date of Exam: 09/17/2020 Medical Rec #:  [id number]        Height:       67.0 in Accession #:    [id number]       Weight:       100.0 lb Date of Birth:  1940/04/19       BSA:          1.506 m Patient Age:    89 years         BP:           149/130 mmHg Patient Gender: M                HR:           94 bpm. Exam Location:  Inpatient Procedure: 2D Echo, 3D Echo, Cardiac Doppler and Color Doppler Indications:    R06.02 SOB  History:        Patient has prior history of Echocardiogram examinations, most                 recent 10/14/2019. Abnormal ECG, Mitral Valve Disease,                 Arrythmias:Tachycardia and Atrial Fibrillation,                 Signs/Symptoms:Hypotension, Shortness of Breath and Dyspnea;                 Risk Factors:Sleep Apnea and Dyslipidemia. Maze procedure.                  Mitral Valve: prosthetic annuloplasty ring valve is present in                 the mitral position. Procedure Date: 2005.  Sonographer:    Roseanna Rainbow RDCS Referring Phys: [id number] [place] T TU   Sonographer Comments: Technically difficult study due to poor echo windows and no subcostal window. Patient has extremely thin body habitus and very high window. IMPRESSIONS  1. Left ventricular ejection fraction, by estimation, is 45 to 50%. The left ventricle has mildly decreased function. The left ventricle demonstrates regional wall motion abnormalities with mid to apical inferoseptal hypokinesis, inferior hypokinesis, and inferolateral hypokinesis. There is mild left ventricular hypertrophy. Left ventricular diastolic parameters are consistent with Grade I diastolic dysfunction (impaired relaxation).  2. Right ventricular systolic function is mildly reduced. The right ventricular size is normal. Tricuspid regurgitation signal is inadequate for assessing PA pressure.  3. Left atrial size was mildly dilated.  4. Status post mitral valve repair. Mean gradient 3 mmHg, no signicant stenosis. No significant regurgitation noted.  5. The aortic valve is tricuspid. Aortic valve regurgitation is trivial. Mild aortic valve sclerosis is present, with no evidence of aortic valve stenosis.  6. Aortic dilatation noted. There is mild dilatation of the ascending aorta, measuring 41 mm.  7. Technically difficult study with poor acoustic windows. FINDINGS  Left Ventricle: Left ventricular ejection fraction, by estimation, is 45 to 50%. The left ventricle has mildly decreased function. The left ventricle demonstrates regional wall motion abnormalities. The left ventricular internal cavity size was normal in size. There is mild left ventricular hypertrophy. Left ventricular diastolic parameters are consistent with Grade I diastolic dysfunction (impaired relaxation). Right Ventricle: The right ventricular size is normal. No increase in right ventricular wall thickness. Right ventricular systolic  function is mildly reduced. Tricuspid regurgitation signal is inadequate for assessing PA pressure. Left Atrium: Left atrial size was  mildly dilated. Right Atrium: Right atrial size was normal in size. Pericardium: There is no evidence of pericardial effusion. Mitral Valve: Status post mitral valve repair. Mean gradient 3 mmHg, no signicant stenosis. No significant regurgitation noted. The mitral valve has been repaired/replaced. No evidence of mitral valve regurgitation. There is a prosthetic annuloplasty ring present in the mitral position. Procedure Date: 2005. MV peak gradient, 7.3 mmHg. The mean mitral valve gradient is 3.0 mmHg. Tricuspid Valve: The tricuspid valve is normal in structure. Tricuspid valve regurgitation is trivial. Aortic Valve: The aortic valve is tricuspid. Aortic valve regurgitation is trivial. Mild aortic valve sclerosis is present, with no evidence of aortic valve stenosis. Pulmonic Valve: The pulmonic valve was normal in structure. Pulmonic valve regurgitation is not visualized. Aorta: Aortic dilatation noted. There is mild dilatation of the ascending aorta, measuring 41 mm. Venous: The inferior vena cava was not well visualized. IAS/Shunts: No atrial level shunt detected by color flow Doppler.  LEFT VENTRICLE PLAX 2D LVIDd:         3.40 cm      Diastology LVIDs:         2.60 cm      LV e' medial:    6.85 cm/s LV PW:         1.25 cm      LV E/e' medial:  19.6 LV IVS:        1.40 cm      LV e' lateral:   8.16 cm/s LVOT diam:     2.00 cm      LV E/e' lateral: 16.4 LV SV:         45 LV SV Index:   30 LVOT Area:     3.14 cm  LV Volumes (MOD) LV vol d, MOD A2C: 115.0 ml LV vol d, MOD A4C: 66.2 ml LV vol s, MOD A2C: 58.0 ml LV vol s, MOD A4C: 37.5 ml LV SV MOD A2C:     57.0 ml LV SV MOD A4C:     66.2 ml LV SV MOD BP:      42.0 ml RIGHT VENTRICLE RV S prime:     9.14 cm/s TAPSE (M-mode): 1.0 cm LEFT ATRIUM             Index       RIGHT ATRIUM          Index LA diam:        3.50 cm 2.32 cm/m  RA Area:     9.97 cm LA Vol (A2C):   49.5 ml 32.86 ml/m RA Volume:   19.40 ml 12.88 ml/m LA Vol (A4C):   20.3 ml 13.48 ml/m LA  Biplane Vol: 30.9 ml 20.51 ml/m  AORTIC VALVE LVOT Vmax:   105.00 cm/s LVOT Vmean:  65.500 cm/s LVOT VTI:    0.142 m  AORTA Ao Root diam: 3.95 cm Ao Asc diam:  4.10 cm MITRAL VALVE MV Area (PHT): 5.02 cm     SHUNTS MV Area VTI:   1.08 cm     Systemic VTI:  0.14 m MV Peak grad:  7.3 mmHg     Systemic Diam: 2.00 cm MV Mean grad:  3.0 mmHg MV Vmax:       1.35 m/s MV Vmean:      75.7 cm/s MV Decel Time: 151 msec MV E velocity: 134.00 cm/s MV A velocity: 130.00 cm/s  MV E/A ratio:  1.03 Dalton McleanMD Electronically signed by Franki Monte Signature Date/Time: 09/17/2020/3:55:31 PM    Final         Scheduled Meds:  ascorbic acid  500 mg Oral Daily   chlorhexidine  15 mL Mouth Rinse BID   Chlorhexidine Gluconate Cloth  6 each Topical Q0600   cholecalciferol  2,000 Units Oral Daily   feeding supplement  237 mL Oral BID BM   folic acid  1 mg Oral Daily   mouth rinse  15 mL Mouth Rinse q12n4p   midodrine  5 mg Oral TID WC   multivitamin with minerals  1 tablet Oral Daily   omega-3 acid ethyl esters  1 g Oral Daily   Continuous Infusions:        Aline August, MD [facility] 09/18/2020, 7:27 AM

## 2020-09-18 NOTE — Progress Notes (Signed)
OT Cancellation Note  Patient Details Name: Austin Davis MRN: [id number] DOB: 1940/10/10   Cancelled Treatment:    Reason Eval/Treat Not Completed: Patient declined, requesting to nap before participating with therapy. Will re-attempt as schedule permits.  Delbert Phenix OT OT pager: [phone] 09/18/2020, 12:00 PM

## 2020-09-19 DIAGNOSIS — J9621 Acute and chronic respiratory failure with hypoxia: Secondary | ICD-10-CM | POA: Diagnosis not present

## 2020-09-19 DIAGNOSIS — J9622 Acute and chronic respiratory failure with hypercapnia: Secondary | ICD-10-CM | POA: Diagnosis not present

## 2020-09-19 DIAGNOSIS — R627 Adult failure to thrive: Secondary | ICD-10-CM

## 2020-09-19 DIAGNOSIS — Z515 Encounter for palliative care: Secondary | ICD-10-CM

## 2020-09-19 DIAGNOSIS — E43 Unspecified severe protein-calorie malnutrition: Secondary | ICD-10-CM

## 2020-09-19 DIAGNOSIS — G250 Essential tremor: Secondary | ICD-10-CM | POA: Diagnosis not present

## 2020-09-19 LAB — BASIC METABOLIC PANEL
Anion gap: 8 (ref 5–15)
BUN: 21 mg/dL (ref 8–23)
CO2: 34 mmol/L — ABNORMAL HIGH (ref 22–32)
Calcium: 8.7 mg/dL — ABNORMAL LOW (ref 8.9–10.3)
Chloride: 97 mmol/L — ABNORMAL LOW (ref 98–111)
Creatinine, Ser: 0.52 mg/dL — ABNORMAL LOW (ref 0.61–1.24)
GFR, Estimated: >60 mL/min (ref >60)
Glucose, Bld: 111 mg/dL — ABNORMAL HIGH (ref 70–99)
Potassium: 4.1 mmol/L (ref 3.5–5.1)
Sodium: 139 mmol/L (ref 135–145)

## 2020-09-19 LAB — CBC WITH DIFFERENTIAL/PLATELET
Abs Immature Granulocytes: 0.04 10*3/uL (ref 0.00–0.07)
Basophils Absolute: 0 10*3/uL (ref 0.0–0.1)
Basophils Relative: 0 %
Eosinophils Absolute: 0 10*3/uL (ref 0.0–0.5)
Eosinophils Relative: 0 %
HCT: 42 % (ref 39.0–52.0)
Hemoglobin: 13.5 g/dL (ref 13.0–17.0)
Immature Granulocytes: 1 %
Lymphocytes Relative: 8 %
Lymphs Abs: 0.7 10*3/uL (ref 0.7–4.0)
MCH: 31.9 pg (ref 26.0–34.0)
MCHC: 32.1 g/dL (ref 30.0–36.0)
MCV: 99.3 fL (ref 80.0–100.0)
Monocytes Absolute: 0.8 10*3/uL (ref 0.1–1.0)
Monocytes Relative: 10 %
Neutro Abs: 6.7 10*3/uL (ref 1.7–7.7)
Neutrophils Relative %: 81 %
Platelets: 108 10*3/uL — ABNORMAL LOW (ref 150–400)
RBC: 4.23 MIL/uL (ref 4.22–5.81)
RDW: 14.2 % (ref 11.5–15.5)
WBC: 8.2 10*3/uL (ref 4.0–10.5)
nRBC: 0 % (ref 0.0–0.2)

## 2020-09-19 LAB — PROTIME-INR
INR: 1.4 — ABNORMAL HIGH (ref 0.8–1.2)
Prothrombin Time: 17.2 seconds — ABNORMAL HIGH (ref 11.4–15.2)

## 2020-09-19 LAB — BRAIN NATRIURETIC PEPTIDE: B Natriuretic Peptide: 876.9 pg/mL — ABNORMAL HIGH (ref 0.0–100.0)

## 2020-09-19 MED ORDER — MIRTAZAPINE 15 MG PO TABS
7.5000 mg | ORAL_TABLET | Freq: Every day | ORAL | Status: DC
  Administered 2020-09-20 – 2020-09-28 (×9): 7.5 mg via ORAL
  Filled 2020-09-19 (×10): qty 1

## 2020-09-19 MED ORDER — ENSURE MAX PROTEIN PO LIQD
11.0000 [oz_av] | Freq: Every day | ORAL | Status: DC
  Administered 2020-09-19 – 2020-09-29 (×9): 11 [oz_av] via ORAL
  Filled 2020-09-19 (×13): qty 330

## 2020-09-19 MED ORDER — SODIUM CHLORIDE 0.9 % IV SOLN: INTRAVENOUS | Status: DC

## 2020-09-19 MED ORDER — WARFARIN SODIUM 4 MG PO TABS
4.0000 mg | ORAL_TABLET | Freq: Once | ORAL | Status: AC
  Administered 2020-09-19: 4 mg via ORAL
  Filled 2020-09-19: qty 1

## 2020-09-19 MED ORDER — HEPARIN BOLUS VIA INFUSION
2000.0000 [IU] | Freq: Once | INTRAVENOUS | Status: AC
  Administered 2020-09-19: 2000 [IU] via INTRAVENOUS
  Filled 2020-09-19: qty 2000

## 2020-09-19 MED ORDER — FUROSEMIDE 10 MG/ML IJ SOLN
20.0000 mg | Freq: Once | INTRAMUSCULAR | Status: AC
  Administered 2020-09-19: 20 mg via INTRAVENOUS
  Filled 2020-09-19: qty 2

## 2020-09-19 MED ORDER — MAGIC MOUTHWASH
10.0000 mL | Freq: Three times a day (TID) | ORAL | Status: DC
  Administered 2020-09-19 – 2020-09-29 (×20): 10 mL via ORAL
  Filled 2020-09-19 (×29): qty 10

## 2020-09-19 MED ORDER — HEPARIN (PORCINE) 25000 UT/250ML-% IV SOLN
750.0000 [IU]/h | INTRAVENOUS | Status: DC
  Administered 2020-09-19: 650 [IU]/h via INTRAVENOUS
  Administered 2020-09-21: 750 [IU]/h via INTRAVENOUS
  Filled 2020-09-19 (×2): qty 250

## 2020-09-19 NOTE — Progress Notes (Addendum)
NAME:  Austin Davis, MRN:  [id number], DOB:  Jan 08, 1941, LOS: 3 ADMISSION DATE:  09/16/2020 CONSULTATION DATE:  09/17/2020 REFERRING MD:  Starla Link - TR[facility] CHIEF COMPLAINT:  Hypercarbic respiratory failure   History of Present Illness:  Austin Davis is seen in consultation at the request of Dr. Starla Link for recommendations on further evaluation and management of hypercarbic respiratory failure.  Patient is a 80 year old man who presented to [facility] ED 8/29 with SOB, dyspnea and fatigue/malaise, worsening over the last several weeks prior to admission. PMHx significant for HLD, PAF, MVP s/p MV annuloplasty (2005, on Coumadin), PAD, CAD, OSA (on CPAP, noncompliant), history of GIB (2019).  On admission, patient reported general decline of his wellness over the past 1-2 years.  He tells me that he locked down for COVID and noted some weight gain from lack of exercise.  He started trying to eat a healthier diet and initially noted about 10 pounds of weight loss, but then noted he could no longer complete tasks he used to do with ease such as mowing the lawn without a significant shortness of breath and then continued to lose weight without effort (approximately 75 pounds) with progressive generalized weakness, malaise, lack of appetite, noting that his favorite foods taste strange or do not taste appetizing anymore with exertional shortness of breath.  He noted a very bad respiratory illness early 2019 that he never had a good etiology for.  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  However his recent PET scan was negative.   He was diagnosed with OSA 10/2019 and CPAP was ordered but not delivered until 07/2020 due to supply issues; he reports significant issues with mask fit/discomfort and associated claustrophobia, especially while lying flat. As  a result, he has only attempted to wear his mask a handful of times since receiving his machine.  Pertinent Medical History:   Past Medical History:  Diagnosis Date   CHF (congestive heart failure) ([place])    Coronary artery disease    GI bleed 2019   hospitalized at [facility] for one week   Headache    since childhood   Low blood sugar    since childhood, controlled by diet   Mitral regurgitation    Mitral valve prolapse    Osteopenia 2021   Paroxysmal atrial fibrillation ([place])    Significant Hospital Events: Including procedures, antibiotic start and stop dates in addition to other pertinent events   8/28 - Presented to [facility] ED for SOB, dyspnea. Admitted for hypercarbic RF. 8/29 - PCCM consulted for hypercarbic RF requiring BiPAP. Nutrition consult. 8/31 On room air, tolerating bipap intermittently at night, CT chest without significant findings   Interim History / Subjective:  Continues to feel dyspneic with conversation and exertion  Objective:  Blood pressure (!) 128/93, pulse 99, temperature 98.4 F (36.9 C), temperature source Oral, resp. rate (!) 24, height 5\' 7"  (1.702 m), weight 45.4 kg, SpO2 91 %.        Intake/Output Summary (Last 24 hours) at 09/19/2020 0859 Last data filed at 09/19/2020 0800 Gross per 24 hour  Intake 92.35 ml  Output 925 ml  Net -832.65 ml    Filed Weights   09/16/20 1450  Weight: 45.4 kg   General:  frail, cachectic M, sitting up in chair eating  HEENT: MM pink/moist,  improving R conjunctival hemorrhage  Neuro: awake, alert and oriented, bilateral UE essential tremor  CV: s1s2 tachycardic, irregular, no m/r/g PULM:  slightly dyspneic and tachypneic with conversation, on RA with sats 90-92%, lungs clear with decreased air entry in bilateral bases GI: soft, bsx4 active  Extremities: warm/dry, no edema, significant muscle wasting  Skin: no rashes or lesions   Resolved Hospital Problem List:    Assessment & Plan:   80 year old  man who presented to [facility] ED 8/29 with SOB, dyspnea and fatigue/malaise, worsening over the last year and acutely several weeks prior to admission. PMHx significant for HLD, PAF, MVP s/p MV annuloplasty (2005, on Coumadin), PAD, CAD, OSA (on CPAP, noncompliant), history of GIB (2019).  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  His profound weight loss and progressive exertional dyspnea is concerning. 8/30 CT chest without evidence of lung disease, shows mild scarring and pleural effusions  P: -The etiology of his profound weight loss, hypoventilation and generalized weakness is not completely clear, on discussion, mentions depression during covid lock-down and seems to be barely eating.  Consider neuromuscular disease, has seen neurology and diagnosed with essential tremor, no bulbar symptoms or upper motor neuron sx's to suggest ALS -His home sleep study showed 200 episodes of apnea, bears repeating in a sleep lab -will call and reschedule outpatient pulmonary appointment with Dr. 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  Previous ABG's have documented high PCO2.  Patient would benefit from non-invasive ventilation.  Without this therapy, the patient is at high risk of ending up with  worsening symptoms, worsened respiratory failure, need for ER visits and/or recurrent hospitalizations.  Bilevel device unable to adequately support patient's nocturnal ventilation needs.  Patient would benefit from NIV therapy with set tidal volumes and pressure.  Trilogy Parameters  [X]  VAPS VT: 6cc/kg IBW, RR 8-15 BPM, IPAP Min 8-12, IPAP Max 12-32, EPAP 4-12, AVAPS Rate 3-5  [X]  AVAPS AE VT: 6-8 cc/kg IBW, RR 8-15 BPM, PS Min 4-10, PS Max 8-20, EPAP Min 4-12, EPAP Max 12-20, Max Pressure 32, AVAPS Rate 3-5   [X]  Duel Setting for Exertion or Respiratory Distress VT: 10cc/kg IBW, RR 14-16, AVAPS Rate 5  [X]  Set Volume, Pressure and Rate Alarms per patient condition [X]  Fit and modify mask as needed   Best Practice: (right click and "Reselect all SmartList Selections" daily)   Per Primary Team  Labs:  CBC: Recent Labs  Lab 09/16/20 1512 09/17/20 0439 09/18/20 0235 09/19/20 0234  WBC 8.5 7.3 7.9 8.2  NEUTROABS 7.0  --  6.3 6.7  HGB 17.8* 13.5 12.9* 13.5  HCT 55.1* 40.5 41.1 42.0  MCV 98.2 97.4 101.7* 99.3  PLT 166 104* 118* 108*    Basic Metabolic Panel: Recent Labs  Lab 09/16/20 1512 09/17/20 0439 09/17/20 1913 09/18/20 0235 09/19/20 0234  NA 140 138  --  140 139  K 4.5 4.5  --  4.3 4.1  CL 91* 99  --  100 97*  CO2 36* 27  --  36* 34*  GLUCOSE 123* 80  --  112* 111*  BUN 34* 31*  --  25* 21  CREATININE 0.80 0.81  --  0.55* 0.52*  CALCIUM 9.4 8.5*  --  8.6* 8.7*  MG  --   --   --  2.3  --   PHOS  --   --  2.7  --   --     GFR: Estimated Creatinine Clearance: 48.1 mL/min (A) (by C-G formula based on SCr of 0.52 mg/dL (L)). Recent Labs  Lab 09/16/20 1512 09/17/20 0439 09/18/20 0235 09/19/20 0234  WBC 8.5 7.3 7.9 8.2    Liver Function Tests: Recent Labs  Lab 09/16/20 1512 09/18/20 0235  AST 34 25  ALT 43 35  ALKPHOS 46 37*  BILITOT 1.4* 0.9  PROT 6.7 4.9*  ALBUMIN 4.4 3.2*    Recent Labs  Lab 09/16/20 1513  LIPASE 36    No results  for input(s): AMMONIA in the last 168 hours.  ABG:    Component Value Date/Time   PHART 7.621 (HH) 09/17/2020 0432   PCO2ART 23.7 (L) 09/17/2020 0432   PO2ART 132 (H) 09/17/2020 0432   HCO3 24.7 09/17/2020 0432   TCO2 30 09/25/2006 1353   O2SAT 99.4 09/17/2020 0432     Coagulation Profile: Recent Labs  Lab 09/16/20 1513 09/17/20 0439 09/18/20 0235 09/19/20 0234  INR 10.9* 6.4* 2.3* 1.4*    Cardiac Enzymes: No results for input(s): CKTOTAL, CKMB, CKMBINDEX, TROPONINI in the last 168 hours.  HbA1C: Hgb A1c MFr Bld  Date/Time Value Ref Range Status  01/30/2015 11:15 AM 5.9 (H) <5.7 % Final    Comment:                                                                           According to the ADA Clinical Practice Recommendations for 2011, when HbA1c is used as a screening test:     >=6.5%   Diagnostic of Diabetes Mellitus            (if abnormal result is confirmed)   5.7-6.4%   Increased risk of developing Diabetes Mellitus   References:Diagnosis and Classification of Diabetes Mellitus,Diabetes TIWP,8099,83(JASNK 1):S62-S69 and Standards of Medical Care in         Diabetes - 2011,Diabetes NLZJ,6734,19 (Suppl 1):S11-S61.     04/26/2013 09:15 AM 6.1 (H) <5.7 % Final    Comment:                                                                           According to the ADA Clinical Practice Recommendations for 2011, when HbA1c is used as a screening test:     >=6.5%   Diagnostic of Diabetes Mellitus            (if abnormal result is  confirmed)   5.7-6.4%   Increased risk of developing Diabetes Mellitus   References:Diagnosis and Classification of Diabetes Mellitus,Diabetes IWPY,0998,33(ASNKN 1):S62-S69 and Standards of Medical Care in         Diabetes - 2011,Diabetes LZJQ,7341,93 (Suppl 1):S11-S61.     CBG: No results for input(s): GLUCAP in the last 168 hours.  Review of Systems:   Review of systems completed with pertinent positives/negatives outlined in  above HPI.  Past Medical History:  He,  has a past medical history of CHF (congestive heart failure) ([place]), Coronary artery disease, GI bleed (2019), Headache, Low blood sugar, Mitral regurgitation, Mitral valve prolapse, Osteopenia (2021), and Paroxysmal atrial fibrillation ([place]).   Surgical History:   Past Surgical History:  Procedure Laterality Date   COLONOSCOPY WITH PROPOFOL N/A 02/19/2017   Procedure: COLONOSCOPY WITH PROPOFOL;  Surgeon: Wilford Corner, MD;  Location: [facility] ENDOSCOPY;  Service: Endoscopy;  Laterality: N/A;   laser eye surgery for retina detachment     MITRAL VALVE REPAIR  01/2003   monitor  02/05/2006   polyp removal     TONSILLECTOMY     tooth removal     as a teenager    Social History:   reports that he has never smoked. He has never used smokeless tobacco. He reports current alcohol use. He reports that he does not use drugs.   Family History:  His family history includes Asthma in his daughter; CAD in his mother; Epilepsy in his son; Heart disease in his mother; Heart failure in his father, maternal grandmother, and paternal grandmother; Migraines in his mother; Pneumonia in his maternal grandfather; Skin cancer in his father; Stroke in his paternal grandfather; Tremor in his father; Valvular heart disease in his father. There is no history of Parkinson's disease.   Allergies: Allergies  Allergen Reactions   Prednisone Other (See Comments)    Makes skin crawl, rapid HR   Cortisone Palpitations    Home Medications: Prior to Admission medications   Medication Sig Start Date End Date Taking? Authorizing Provider  acetaminophen (TYLENOL) 500 MG tablet Take 500 mg by mouth every 6 (six) hours as needed for mild pain or headache.   Yes [provider]  ALPRAZolam (XANAX) 0.25 MG tablet Take 0.125 mg by mouth at bedtime as needed for anxiety.   Yes [provider]  Ascorbic Acid (VITAMIN C IMMUNE HEALTH PO) Take 500 mg by mouth.   Yes [provider]  B Complex Vitamins (VITAMIN B COMPLEX PO) Take 1 tablet by mouth daily.   Yes [provider]  Cholecalciferol (D3) 50 MCG (2000 UT) TABS Take 2,000 Units by mouth daily.   Yes [provider]  diphenhydramine-acetaminophen (TYLENOL PM) 25-500 MG TABS tablet Take 1 tablet by mouth at bedtime as needed.   Yes [provider]  Garlic 790 MG TABS Take 100 mg by mouth daily.   Yes [provider]  Magnesium 250 MG TABS Take 250 mg by mouth daily.   Yes [provider]  Omega-3 Fatty Acids (FISH OIL PO) Take 1 capsule by mouth daily.   Yes [provider]  warfarin (COUMADIN) 1 MG tablet Take 1 mg by mouth daily.   Yes [provider]  primidone (MYSOLINE) 50 MG tablet Start with 1/2 pill at bedtime(25mg ). If no side effects in 2 weeks, may increase to a whole tablet(50mg ) at bedtime otherwise continue with 1/2 pill(25mg ) Patient not taking: No sig reported 07/25/20   Melvenia Beam, MD  warfarin (COUMADIN) 5 MG tablet TAKE 1/2 TO 1 TABLET DAILY AS DIRECTED BY COUMADIN CLINIC. 09/17/20   Minus Breeding, MD    Critical care time: n/a   Otilio Carpen Valerian Jewel, PA-C [facility] See Amion for pager If no response to pager , please call [phone] until 7pm After 7:00 pm call Elink  [phone]

## 2020-09-19 NOTE — NC FL2 (Signed)
[place] MEDICAID FL2 LEVEL OF CARE SCREENING TOOL     IDENTIFICATION  Patient Name: Austin Davis Birthdate: 06-13-1940 Sex: male Admission Date (Current Location): 09/16/2020  [facility] and [place] Number:  Herbalist and Address:  [facility],  [place] [address] [place]      Provider Number: [id number]  Attending Physician Name and Address:  Kathie Dike, MD  Relative Name and Phone Number:       Current Level of Care: Hospital Recommended Level of Care: [place] Prior Approval Number:    Date Approved/Denied:   PASRR Number: [id number]  Discharge Plan: SNF    Current Diagnoses: Patient Active Problem List   Diagnosis Date Noted   Protein-calorie malnutrition, severe 09/17/2020   Acute on chronic respiratory failure with hypoxia and hypercapnia (HCC) 09/16/2020   Failure to thrive in adult 09/16/2020   AF (paroxysmal atrial fibrillation) ([place]) 09/16/2020   Stage 1 skin ulcer of sacral region ([place]) 09/16/2020   Hypotension 06/30/2020   GIB (gastrointestinal bleeding) 02/17/2017   Rectal bleeding 02/16/2017   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   Essential tremor 02/19/2015   Status post mitral valve annuloplasty and MAZE 2005 12/12/2014   HLD (hyperlipidemia) 05/05/2013   DOE (dyspnea on exertion) 04/22/2013   Fatigue 04/22/2013   Atrial fibrillation ([place]) 05/08/2012    Orientation RESPIRATION BLADDER Height & Weight     Self, Time, Situation, Place  Normal Continent Weight: 45.4 kg Height:  5\' 7"  (170.2 cm)  BEHAVIORAL SYMPTOMS/MOOD NEUROLOGICAL BOWEL NUTRITION STATUS      Continent Diet (regular)  AMBULATORY STATUS COMMUNICATION OF NEEDS Skin   Extensive Assist Verbally Normal                       Personal Care Assistance Level of Assistance  Bathing, Feeding, Dressing Bathing Assistance: Limited assistance Feeding assistance: Limited assistance Dressing Assistance:  Limited assistance     Functional Limitations Info  Sight, Hearing, Speech Sight Info: Adequate Hearing Info: Adequate Speech Info: Adequate    SPECIAL CARE FACTORS FREQUENCY  PT (By licensed PT), OT (By licensed OT)     PT Frequency: 5 x weekly OT Frequency: 5 x weekly            Contractures Contractures Info: Not present    Additional Factors Info  Code Status Code Status Info: full             Current Medications (09/19/2020):  This is the current hospital active medication list Current Facility-Administered Medications  Medication Dose Route Frequency Provider Last Rate Last Admin   0.9 %  sodium chloride infusion   Intravenous Continuous Kathryne Eriksson, NP 50 mL/hr at 09/19/20 0700 Infusion Verify at 09/19/20 0700   acetaminophen (TYLENOL) tablet 500 mg  500 mg Oral Q6H PRN Tu, Ching T, DO   500 mg at 09/18/20 2140   albuterol (PROVENTIL) (2.5 MG/3ML) 0.083% nebulizer solution 2.5 mg  2.5 mg Nebulization Q2H PRN Tu, Ching T, DO       ALPRAZolam Duanne Moron) tablet 0.125 mg  0.125 mg Oral QHS PRN Tu, Ching T, DO   0.125 mg at 09/18/20 2135   ascorbic acid (VITAMIN C) tablet 500 mg  500 mg Oral Daily Tu, Ching T, DO   500 mg at 09/18/20 1009   chlorhexidine (PERIDEX) 0.12 % solution 15 mL  15 mL Mouth Rinse BID Tu, Ching T, DO   15 mL at 09/18/20  2134   Chlorhexidine Gluconate Cloth 2 % PADS 6 each  6 each Topical Q0600 Tu, Ching T, DO   6 each at 09/18/20 2119   cholecalciferol (VITAMIN D) tablet 2,000 Units  2,000 Units Oral Daily Tu, Ching T, DO   2,000 Units at 09/18/20 1009   dextromethorphan-guaiFENesin (MUCINEX DM) 30-600 MG per 12 hr tablet 1 tablet  1 tablet Oral BID Aline August, MD   1 tablet at 09/18/20 2135   feeding supplement (ENSURE ENLIVE / ENSURE PLUS) liquid 237 mL  237 mL Oral BID BM Alekh, Kshitiz, MD   237 mL at 41/74/08 1448   folic acid (FOLVITE) tablet 1 mg  1 mg Oral Daily Alekh, Kshitiz, MD   1 mg at 09/18/20 1009   MEDLINE mouth rinse  15 mL  Mouth Rinse q12n4p Tu, Ching T, DO   15 mL at 09/18/20 1615   midodrine (PROAMATINE) tablet 5 mg  5 mg Oral TID WC Blount, Xenia T, NP   5 mg at 09/18/20 1607   multivitamin with minerals tablet 1 tablet  1 tablet Oral Daily Aline August, MD   1 tablet at 09/18/20 1009   omega-3 acid ethyl esters (LOVAZA) capsule 1 g  1 g Oral Daily Tu, Ching T, DO   1 g at 09/18/20 1009   Warfarin - Pharmacist Dosing Inpatient   Does not apply q1600 Aline August, MD   Given at 09/18/20 1615     Discharge Medications: Please see discharge summary for a list of discharge medications.  Relevant Imaging Results:  Relevant Lab Results:   Additional Information [id number]  Leeroy Cha, RN

## 2020-09-19 NOTE — Progress Notes (Signed)
ANTICOAGULATION CONSULT NOTE - Follow Up Consult  Pharmacy Consult for warfarin & heparin  Indication: atrial fibrillation  Allergies  Allergen Reactions   Prednisone Other (See Comments)    Makes skin crawl, rapid HR   Cortisone Palpitations    Patient Measurements: Height: 5\' 7"  (170.2 cm) Weight: 45.4 kg (100 lb) IBW/kg (Calculated) : 66.1 Heparin Dosing Weight:   Vital Signs: Temp: 98.1 F (36.7 C) (08/31 1200) Temp Source: Oral (08/31 1200) BP: 123/89 (08/31 1700) Pulse Rate: 100 (08/31 1700)  Labs: Recent Labs    09/17/20 0439 09/18/20 0235 09/19/20 0234  HGB 13.5 12.9* 13.5  HCT 40.5 41.1 42.0  PLT 104* 118* 108*  LABPROT 56.0* 25.5* 17.2*  INR 6.4* 2.3* 1.4*  CREATININE 0.81 0.55* 0.52*     Estimated Creatinine Clearance: 48.1 mL/min (A) (by C-G formula based on SCr of 0.52 mg/dL (L)).   Medications:  Scheduled:   ascorbic acid  500 mg Oral Daily   chlorhexidine  15 mL Mouth Rinse BID   Chlorhexidine Gluconate Cloth  6 each Topical Q0600   cholecalciferol  2,000 Units Oral Daily   dextromethorphan-guaiFENesin  1 tablet Oral BID   feeding supplement  237 mL Oral BID BM   folic acid  1 mg Oral Daily   furosemide  20 mg Intravenous Once   heparin  2,000 Units Intravenous Once   mouth rinse  15 mL Mouth Rinse q12n4p   midodrine  5 mg Oral TID WC   multivitamin with minerals  1 tablet Oral Daily   omega-3 acid ethyl esters  1 g Oral Daily   Warfarin - Pharmacist Dosing Inpatient   Does not apply q1600   Infusions:   heparin     Home warfarin dosing unclear on med rec.  [facility] was contacted to clarify dosing.  Current regimen is 5mg  daily PLUS 1mg  once weekly. Unable to tell me what day of the week patient is supposed to take the 1mg , but said it was started on 08/09/20 due to DDI with primidone.  Assessment: 40 yoM admitted on 8/28 with acute hypoxic and hypercarbic respite failure . PMH significant for chronic warfarin  anticoagulation, AFib, s/p mitral valve annuloplasty and MAZE 2005.  Admit INR 10.9 and was given Vitamin K 2.5 mg PO. Pharmacy is consulted to resume warfarin dosing on 8/30.  Today, 09/19/2020: INR 1.4 CBC:  Hgb remains stable/wnl, Plt low/decreased to 108 No bleeding or complications reported DDI:  suspect that vitamin K will suppress INR for several days.  Note that patient was NOT taking primidone prior to admission.  Diet:  regular   Goal of Therapy:  INR 2-3 Monitor platelets by anticoagulation protocol: Yes   Plan:  Warfarin 4 mg PO x 1. Daily PT/INR   Gretta Arab PharmD, BCPS Clinical Pharmacist [facility] main pharmacy [phone] 09/19/2020 6:15 PM   Addendum: Pharmacy consulted to dose heparin for Afib while INR is sub-therapeutic. Pharmacy already dosing warfarin.    Plan: Heparin 2000 unit bolus & heparin drip 650 units/hr Check 8 hr heparin level Warfarin 4 mg given today @ 1733 Daily CBC, heparin level & INR   Eudelia Bunch, Pharm.D 09/19/2020 6:16 PM

## 2020-09-19 NOTE — TOC Progression Note (Addendum)
Transition of Care [facility] - Progression Note    Patient Details  Name: Austin Davis MRN: [id number] Date of Birth: 04-24-1940  Transition of Care [facility]) CM/SW Contact  Leeroy Cha, RN Phone Number: 09/19/2020, 7:22 AM  Clinical Narrative:    80 y.o. male with medical history significant for paroxysmal atrial fibrillation, mitral valve annuloplasty and maze procedure in 2005 on Coumadin, PAD, history of GI bleed, essential tremor, OSA not able to tolerate CPAP and hyperlipidemia, progressive health decline along with weight loss over the last year presented with increasing shortness of breath.  On presentation, he was tachycardic and slightly hypotensive and hypoxic with oxygen saturations below 90.  On initial ABG, he was acidotic with pH of 7.227 with PCO2 of 90.  He was started on BiPAP.  Chest x-ray was negative for infiltrates.  INR was 10.9 with no overt bleeding.  He was given vitamin K in the ED.   Assessment & Plan:   Acute hypoxic and hypercarbic respite failure  -Patient was hypoxic and hypercapnic on presentation.  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 -Chest x-ray negative for infiltrates.  COVID and influenza testing negative on presentation.  Required BiPAP on presentation. -Pulmonary following: Recommend to continue nocturnal ventilation nightly. -Patient could not tolerate CPAP throughout the night last night -Echo showed EF of 45 to 50% with grade 1 diastolic dysfunction   Hypotension -Blood pressure still intermittently on the lower side.  Continue midodrine.  Normal cortisol level    Failure to thrive/generalized weakness/severe malnutrition -Follow nutrition recommendations.  PT recommends home and PT versus SNF.  TOC consult. -had work up with endocrinology in June 2022 and had PET scan showing no evidence of parganglioma with physiologic activity within the adrenal glands. Unfortunately no other documentation  found in Epic. Recommend f/u with endocrinology outpatient  -Endocrine consultation for goals of care discussion -Palliative care consultation for goals of care discussion is pending.   Supratherapeutic INR/history of mitral annuloplasty Paroxysmal A. fib status post maze procedure -INR 10.9 on presentation.  Status post vitamin K given in the ED.  INR 2.3 today.  Resume Coumadin.  Monitor daily INR.  Consult pharmacy. -Echo as above. -Outpatient follow-up with cardiology.  Documented to have intolerance to beta-blocker in the past.  Intermittently tachycardic. TOC PLAN OF CARE: following for snf placement/ passar number [id number] A ssn=[id number] Fl2 not sent out at this time due to condition and progression. Progression: room air during day requiring bipap at night. Order for triology vent placed in system.  Question as to if patient will go to snf at dc.  Triology vent would not go with patient.  Wuill hold on ordering until clarification per conversation with the PA Laura Gleason. Expected Discharge Plan: [place] Barriers to Discharge: Continued Medical Work up  Expected Discharge Plan and Services Expected Discharge Plan: [place]   Discharge Planning Services: CM Consult   Living arrangements for the past 2 months: Single Family Home                                       Social Determinants of Health (SDOH) Interventions    Readmission Risk Interventions No flowsheet data found.

## 2020-09-19 NOTE — Progress Notes (Signed)
Attempted to reach pt's girlfriend Mirna Mires who had called the unit wanting an update, but call was not answered.

## 2020-09-19 NOTE — Consult Note (Signed)
Palliative care consult received and chart reviewed. Ongoing medical evaluation and diagnostics will inform prognostication and help with advance care planning and goals of care discussion. Austin Davis is followed closely by [facility] outpatient- Dr. Silas Flood and conversations have ben held around goals of care. At this time he desires ongoing full scope medical treatment and evaluation. Will follow for needs during hospitalization -he will benefit from formal advance care planing and serious illness support programs following discharge from the hospital-especially if he returns home.  Lane Hacker, DO Palliative Medicine

## 2020-09-19 NOTE — Progress Notes (Addendum)
Pt educated on the importance and benefits of wearing cpap, but he still refused.  Pt stated he's tried it the last two nights and hasn't had a decent night's sleep.  Pt wore it the last two nights for about two hours each night.  Pt adamant that he doesn't want to wear it tonight.  Pt encouraged to call should he change his mind.  RN aware.  Cpap machine remains in room on standby as needed.

## 2020-09-19 NOTE — Progress Notes (Signed)
Davis CONSULT NOTE - Follow Up Consult  Pharmacy Consult for warfarin Indication: atrial fibrillation  Allergies  Allergen Reactions   Prednisone Other (See Comments)    Makes skin crawl, rapid HR   Cortisone Palpitations    Patient Measurements: Height: 5\' 7"  (170.2 cm) Weight: 45.4 kg (100 lb) IBW/kg (Calculated) : 66.1 Heparin Dosing Weight:   Vital Signs: Temp: 98.4 F (36.9 C) (08/31 0800) Temp Source: Oral (08/31 0800) BP: 113/92 (08/31 0900) Pulse Rate: 108 (08/31 0900)  Labs: Recent Labs    09/16/20 1512 09/16/20 1513 09/16/20 1754 09/17/20 0439 09/18/20 0235 09/19/20 0234  HGB 17.8*  --   --  13.5 12.9* 13.5  HCT 55.1*  --   --  40.5 41.1 42.0  PLT 166  --   --  104* 118* 108*  LABPROT  --    < >  --  56.0* 25.5* 17.2*  INR  --    < >  --  6.4* 2.3* 1.4*  CREATININE 0.80  --   --  0.81 0.55* 0.52*  TROPONINIHS 6  --  6  --   --   --    < > = values in this interval not displayed.    Estimated Creatinine Clearance: 48.1 mL/min (A) (by C-G formula based on SCr of 0.52 mg/dL (L)).   Medications:  Scheduled:   ascorbic acid  500 mg Oral Daily   chlorhexidine  15 mL Mouth Rinse BID   Chlorhexidine Gluconate Cloth  6 each Topical Q0600   cholecalciferol  2,000 Units Oral Daily   dextromethorphan-guaiFENesin  1 tablet Oral BID   feeding supplement  237 mL Oral BID BM   folic acid  1 mg Oral Daily   mouth rinse  15 mL Mouth Rinse q12n4p   midodrine  5 mg Oral TID WC   multivitamin with minerals  1 tablet Oral Daily   omega-3 acid ethyl esters  1 g Oral Daily   Warfarin - Pharmacist Dosing Inpatient   Does not apply q1600   Infusions:   sodium chloride 50 mL/hr at 09/19/20 0800   Home warfarin dosing unclear on med rec.  [facility] was contacted to clarify dosing.  Current regimen is 5mg  daily PLUS 1mg  once weekly. Unable to tell me what day of the week patient is supposed to take the 1mg , but said it was started on 08/09/20 due to  DDI with primidone.  Assessment: Austin Davis, Austin Davis, Austin mitral valve annuloplasty and MAZE 2005.  Admit INR 10.9 and was given Vitamin K 2.5 mg PO. Pharmacy is consulted to resume warfarin dosing on 8/30.  Today, 09/19/2020: INR 1.4 CBC:  Hgb remains stable/wnl, Plt low/decreased to 108 No bleeding or complications reported DDI:  suspect that vitamin K will suppress INR for several days.  Note that patient was NOT taking primidone prior to admission.  Diet:  regular   Goal of Therapy:  INR 2-3 Monitor platelets by Davis protocol: Yes   Plan:  Warfarin 4 mg PO x 1. Daily PT/INR   Gretta Arab PharmD, BCPS Clinical Pharmacist [facility] main pharmacy [phone] 09/19/2020 11:07 AM

## 2020-09-19 NOTE — Evaluation (Signed)
Occupational Therapy Evaluation Patient Details Name: Austin Davis MRN: [id number] DOB: [birth year] Today's Date: 09/19/2020    History of Present Illness 80 y.o. male with medical history significant for paroxysmal atrial fibrillation, mitral valve annuloplasty , PAD, history of GI bleed, essential tremor, OSA not able to tolerate CPAP and hyperlipidemia, progressive health declineand weight loss. Presented 09/16/2020 with increasing shortness of breath, tachycardic and slightly hypotensive and hypoxic with oxygen saturations below 90.  On initial ABG, he was acidotic with pH of 7.227 with PCO2 of 90.  He was started on BiPAP.  Chest x-ray was negative for infiltrates.  INR was 10.9   Clinical Impression   Patient is a 80 year old male who was admitted for above. Patient was living at home with girlfriend per patient report. Currently, patient was mod A for repositioning in bed with max multimodal cues with set up for lunch tasks. Patient was noted to have hallucinations with nurse made aware. Patient has decreased activity tolerance, decreased endurance, decreased sitting balance impacting ability to participate in ADLs.Patient would continue to benefit from skilled OT services at this time while admitted and after d/c to address noted deficits in order to improve overall safety and independence in ADLs.      Follow Up Recommendations  Home health OT;SNF (pending progress)    Equipment Recommendations  Other (comment) (pending progress)    Recommendations for Other Services       Precautions / Restrictions Precautions Precautions: Fall Precaution Comments: monitor sats Restrictions Weight Bearing Restrictions: No      Mobility Bed Mobility Overal bed mobility: Needs Assistance Bed Mobility: Rolling Rolling: Mod assist              Transfers                      Balance                                           ADL either performed or  assessed with clinical judgement   ADL Overall ADL's : Needs assistance/impaired Eating/Feeding: Set up;Sitting   Grooming: Oral care;Wash/dry face;Set up;Bed level   Upper Body Bathing: Bed level;Set up   Lower Body Bathing: Bed level;Moderate assistance   Upper Body Dressing : Minimal assistance;Bed level   Lower Body Dressing: Moderate assistance;Bed level     Toilet Transfer Details (indicate cue type and reason): unable to attempt on this date with current level of fatigue. Toileting- Clothing Manipulation and Hygiene: Bed level;Moderate assistance         General ADL Comments: patient was able to assit wtih repositioning in bed with modA to increase comfort and ability to engage in lunch. patient was reoriented to time and location with increased time. patient was able to use BUE to assist with positioning in bed with modA to move body. patient was able to pick up small water bottle and take small sips and was able to set up sandwich. patient was encouraged to eat. patient verbalized understanding.     Vision         Perception     Praxis      Pertinent Vitals/Pain Pain Assessment: No/denies pain     Hand Dominance Right   Extremity/Trunk Assessment Upper Extremity Assessment Upper Extremity Assessment: Generalized weakness   Lower Extremity Assessment Lower Extremity Assessment: Defer to PT evaluation  Cervical / Trunk Assessment Cervical / Trunk Assessment: Normal   Communication Communication Communication: No difficulties   Cognition Arousal/Alertness: Awake/alert Behavior During Therapy: Impulsive;WFL for tasks assessed/performed Overall Cognitive Status: No family/caregiver present to determine baseline cognitive functioning                                 General Comments: patient was attempting to get out of bed without staff to " get the ladder behind me and fix the parts that are broken". patient was reoriented to time and  location with patient noted to clear up towards end of session. patient asked at end of session if ladder was real.   General Comments       Exercises     Shoulder Instructions      Home Living Family/patient expects to be discharged to:: Private residence Living Arrangements: Spouse/significant other Available Help at Discharge: Friend(s);Available 24 hours/day Type of Home: House Home Access: Stairs to enter [employer] of Steps: 1   Home Layout: One level     Bathroom Shower/Tub: Tub/shower unit         Home Equipment: Cane - single point          Prior Functioning/Environment Level of Independence: Independent        Comments: recently has been having assistance to ambulate in home. patient reported being independent in ADLs and still driving prior level.        OT Problem List: Decreased strength;Decreased activity tolerance;Impaired balance (sitting and/or standing);Decreased safety awareness;Cardiopulmonary status limiting activity;Decreased knowledge of use of DME or AE;Decreased coordination      OT Treatment/Interventions: Self-care/ADL training;Therapeutic exercise;Neuromuscular education;DME and/or AE instruction;Therapeutic activities;Balance training;Patient/family education    OT Goals(Current goals can be found in the care plan section) Acute Rehab OT Goals Patient Stated Goal: to get better OT Goal Formulation: With patient Time For Goal Achievement: 10/03/20 Potential to Achieve Goals: Good  OT Frequency: Min 2X/week   Barriers to D/C: Decreased caregiver support          Co-evaluation              AM-PAC OT "6 Clicks" Daily Activity     Outcome Measure Help from another person eating meals?: A Little Help from another person taking care of personal grooming?: A Little Help from another person toileting, which includes using toliet, bedpan, or urinal?: A Lot Help from another person bathing (including washing, rinsing,  drying)?: Total Help from another person to put on and taking off regular upper body clothing?: A Lot Help from another person to put on and taking off regular lower body clothing?: A Lot 6 Click Score: 13   End of Session Nurse Communication: Mobility status  Activity Tolerance: Patient limited by lethargy Patient left: in bed;with call bell/phone within reach;with bed alarm set  OT Visit Diagnosis: Muscle weakness (generalized) (M62.81);Unsteadiness on feet (R26.81)                Time: 9892-1194 OT Time Calculation (min): 23 min Charges:  OT General Charges $OT Visit: 1 Visit OT Evaluation $OT Eval Moderate Complexity: 1 Mod OT Treatments $Self Care/Home Management : 8-22 mins  Jackelyn Poling OTR/L, MS Acute Rehabilitation Department Office# [phone] Pager# [phone]   [place] 09/19/2020, 2:57 PM

## 2020-09-19 NOTE — Consult Note (Addendum)
Palliative Care Consult Note  [name] is a 80 yo man admitted with acute on chronic respiratory failure, severe weight loss/malnutrition and significant muscle wasting and tremor.Palliative care consultation requested for goals of care discussion, symptom management and advance care planning. I spoke with Dr. Loanne Davis at length prior to consultation in order to better understand the diagnostic challenges, prognostication and trajectory of his illness. At the present time there is not a good explanation for his hypoventilation, malnutrition or unusual neurological presentation- but it all seems to be very much related. This history of how his condition has evolved is complicated and he has had multiple specialist evaluations with no clear organic cause for his decline.  Observations during my evaluation- patient's significant other of 20 years Austin Davis and [name] were both at bedside and provided some additional hx. RN made me aware of difficult family dynamics going on just before my visit.  Patient himself was a poor source of information and had trouble focusing and often talked about things that happened in the past or when I would ask him about something happening to himself he would tell me a story about something similar happening to someone else he knew-like his son or an old friend. He was very guarded with information-difficult to know if it was confabulation or mistrusting. I am concerned for cognitive dysfunction, vascular dementia or neuro-psychiatric problems.  His wife approached me outside of the room to tell me she was worried that he was having breathing problems because of black mold that is in the bathroom and on the outside of the window of their home. The issue of mold reactions came up several times. I attempted to do a depression screen but he was unable to participate and told me that "I was putting him off". He became very paranoid about who I was and what my "area of  specialty" was and accused me having a motive and being worried about my bottom line. He also appeared suspicious of RN who brought in his medications. He needs a psychiatric evaluation-but does not appear to be open to any medication recommendations to help him. I strongly recommended starting Mirtazapine for both depression and his appetite. He told me he hasn't decided yet that he will take it and that every other doctor who has seen him thinks they have something to help him. He may benefit from additional neurological evaluation. He clearly had a pill rolling tremor in right hand at rest-some in left hand as well. 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 May respond to medication. Olfactory and taste dysfunction occur in this disease commonly. 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 I told him we could help with HCPOA. Despite his uncertainty of my role and recommendations, he did ask me to come back tomorrow. Will continue to build rapport and trust before additional discussion about code status or ACP.  Austin Hacker, DO Palliative Medicine   Time: 70 minutes Greater than 50%  of this time was spent counseling and coordinating care related to the above assessment and plan.

## 2020-09-19 NOTE — Progress Notes (Signed)
Patient ID: Austin Davis, male   DOB: [birth year], 80 y.o.   MRN: [id number]  PROGRESS NOTE    Austin Davis  VZC:[id number] DOB: 08-Aug-1940 DOA: 09/16/2020 PCP: [name], MD   Brief Narrative:  80 y.o. male with medical history significant for paroxysmal atrial fibrillation, mitral valve annuloplasty and maze procedure in 2005 on Coumadin, PAD, history of GI bleed, essential tremor, OSA not able to tolerate CPAP and hyperlipidemia, progressive health decline along with weight loss over the last year presented with increasing shortness of breath.  On presentation, he was tachycardic and slightly hypotensive and hypoxic with oxygen saturations below 90.  On initial ABG, he was acidotic with pH of 7.227 with PCO2 of 90.  He was started on BiPAP.  Chest x-ray was negative for infiltrates.  INR was 10.9 with no overt bleeding.  He was given vitamin K in the ED.  Assessment & Plan:   Acute hypoxic and hypercarbic respite failure  -Patient was hypoxic and hypercapnic on presentation.  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 -Chest x-ray negative for infiltrates.  COVID and influenza testing negative on presentation.  Required BiPAP on presentation. -Pulmonary following: Recommend to continue nocturnal ventilation nightly. -He is being set up with a home trilogy noninvasive ventilator -Echo showed EF of 45 to 50% with grade 1 diastolic dysfunction -Repeat CT scan on 8/30 does show small bilateral pleural effusions.  He does have some lower extremity edema.  Check BNP  Hypotension -Normal cortisol level  -Currently on midodrine -Blood pressures appear to be in normal range  Failure to thrive/generalized weakness/severe malnutrition -Follow nutrition recommendations.  PT recommends home and PT versus SNF.  TOC consult. -had work up with endocrinology in June 2022 and had PET scan showing no evidence of paraganglioma with physiologic  activity within the adrenal glands. Unfortunately no other documentation found in Epic. Recommend f/u with endocrinology outpatient  -Palliative care input appreciated, will need outpatient palliative care to follow after discharge to help address formal advanced care planning.  Currently, patient is requesting full scope medical treatment  Supratherapeutic INR/history of mitral annuloplasty Paroxysmal A. fib status post maze procedure -INR 10.9 on presentation.  Status post vitamin K given in the ED.  INR is down to 1.4 today.  Coumadin has been resumed.  We will start on heparin bridge.  Monitor daily INR.  Consult pharmacy. -Echo as above. -Outpatient follow-up with cardiology.  Documented to have intolerance to beta-blocker in the past.  Intermittently tachycardic.  Essential tremor -Followed by neurology.  Could not tolerate beta-blockers in the past.  Apparently declined gabapentin -Continue as needed as needed Xanax.  Outpatient follow-up with PCP/neurology  Right subconjunctival hemorrhage -Continue to monitor  Stage I pressure sacral ulcer: Present on admission -Continue local wound care  DVT prophylaxis: SCDs Code Status: Full Family Communication: Discussed with friend at the bedside Disposition Plan: Status is: Inpatient  Remains inpatient appropriate because:Inpatient level of care appropriate due to severity of illness  Dispo: The patient is from: Home              Anticipated d/c is to: Home versus skilled nursing facility pending progression              Patient currently is not medically stable to d/c.   Difficult to place patient No   Consultants: PCCM/palliative care  Procedures: Echo  Antimicrobials: None   Subjective: Patient seen and examined at  bedside.  No overnight fever, vomiting reported.  Breathing is slightly better.  Patient did not wear CPAP overnight as per nursing staff.  Still feels weak and tired.  Objective: Vitals:   09/19/20 1200  09/19/20 1300 09/19/20 1600 09/19/20 1700  BP: (!) 141/103 (!) 130/93 (!) 122/91 123/89  Pulse: (!) 105 98 (!) 104 100  Resp: (!) 24 (!) 27 15 20   Temp: 98.1 F (36.7 C)     TempSrc: Oral     SpO2: 93% 92% 94% 94%  Weight:      Height:        Intake/Output Summary (Last 24 hours) at 09/19/2020 1744 Last data filed at 09/19/2020 1734 Gross per 24 hour  Intake 570.68 ml  Output 650 ml  Net -79.32 ml   Filed Weights   09/16/20 1450  Weight: 45.4 kg    Examination:  General exam: No acute distress.  Extremely thinly built and looks chronically ill and deconditioned.  Respiratory system: Decreased breath sounds at bases bilaterally with tachypnea and some crackles  cardiovascular system: Rate controlled, S1-S2 heard  gastrointestinal system: Abdomen is distended slightly, soft and nontender.  Bowel sounds are heard  extremities: 1+ lower extremity edema Central nervous system: Awake, slow to respond.  No focal neurological deficits.  Moves extremities, 4/5 strength in upper and lower extremities  skin: No obvious ecchymosis/rashes Psychiatry: Flat affect.  Intermittently gets anxious.  Data Reviewed: I have personally reviewed following labs and imaging studies  CBC: Recent Labs  Lab 09/16/20 1512 09/17/20 0439 09/18/20 0235 09/19/20 0234  WBC 8.5 7.3 7.9 8.2  NEUTROABS 7.0  --  6.3 6.7  HGB 17.8* 13.5 12.9* 13.5  HCT 55.1* 40.5 41.1 42.0  MCV 98.2 97.4 101.7* 99.3  PLT 166 104* 118* 989*   Basic Metabolic Panel: Recent Labs  Lab 09/16/20 1512 09/17/20 0439 09/17/20 1913 09/18/20 0235 09/19/20 0234  NA 140 138  --  140 139  K 4.5 4.5  --  4.3 4.1  CL 91* 99  --  100 97*  CO2 36* 27  --  36* 34*  GLUCOSE 123* 80  --  112* 111*  BUN 34* 31*  --  25* 21  CREATININE 0.80 0.81  --  0.55* 0.52*  CALCIUM 9.4 8.5*  --  8.6* 8.7*  MG  --   --   --  2.3  --   PHOS  --   --  2.7  --   --    GFR: Estimated Creatinine Clearance: 48.1 mL/min (A) (by C-G formula based  on SCr of 0.52 mg/dL (L)). Liver Function Tests: Recent Labs  Lab 09/16/20 1512 09/18/20 0235  AST 34 25  ALT 43 35  ALKPHOS 46 37*  BILITOT 1.4* 0.9  PROT 6.7 4.9*  ALBUMIN 4.4 3.2*   Recent Labs  Lab 09/16/20 1513  LIPASE 36   No results for input(s): AMMONIA in the last 168 hours. Coagulation Profile: Recent Labs  Lab 09/16/20 1513 09/17/20 0439 09/18/20 0235 09/19/20 0234  INR 10.9* 6.4* 2.3* 1.4*   Cardiac Enzymes: No results for input(s): CKTOTAL, CKMB, CKMBINDEX, TROPONINI in the last 168 hours. BNP (last 3 results) No results for input(s): PROBNP in the last 8760 hours. HbA1C: No results for input(s): HGBA1C in the last 72 hours. CBG: No results for input(s): GLUCAP in the last 168 hours. Lipid Profile: No results for input(s): CHOL, HDL, LDLCALC, TRIG, CHOLHDL, LDLDIRECT in the last 72 hours. Thyroid Function Tests: Recent Labs  09/18/20 0235  TSH 1.538   Anemia Panel: Recent Labs    09/18/20 0235  VITAMINB12 821   Sepsis Labs: No results for input(s): PROCALCITON, LATICACIDVEN in the last 168 hours.  Recent Results (from the past 240 hour(s))  Resp Panel by RT-PCR (Flu A&B, Covid) Nasopharyngeal Swab     Status: None   Collection Time: 09/16/20  3:22 PM   Specimen: Nasopharyngeal Swab; Nasopharyngeal(NP) swabs in vial transport medium  Result Value Ref Range Status   SARS Coronavirus 2 by RT PCR NEGATIVE NEGATIVE Final    Comment: (NOTE) SARS-CoV-2 target nucleic acids are NOT DETECTED.  The SARS-CoV-2 RNA is generally detectable in upper respiratory specimens during the acute phase of infection. The lowest concentration of SARS-CoV-2 viral copies this assay can detect is 138 copies/mL. A negative result does not preclude SARS-Cov-2 infection and should not be used as the sole basis for treatment or other patient management decisions. A negative result may occur with  improper specimen collection/handling, submission of specimen  other than nasopharyngeal swab, presence of viral mutation(s) within the areas targeted by this assay, and inadequate number of viral copies(<138 copies/mL). A negative result must be combined with clinical observations, patient history, and epidemiological information. The expected result is Negative.  Fact Sheet for Patients:  EntrepreneurPulse.com.au  Fact Sheet for Healthcare Providers:  IncredibleEmployment.be  This test is no t yet approved or cleared by the [place] FDA and  has been authorized for detection and/or diagnosis of SARS-CoV-2 by FDA under an Emergency Use Authorization (EUA). This EUA will remain  in effect (meaning this test can be used) for the duration of the COVID-19 declaration under Section 564(b)(1) of the Act, 21 U.S.C.section 360bbb-3(b)(1), unless the authorization is terminated  or revoked sooner.       Influenza A by PCR NEGATIVE NEGATIVE Final   Influenza B by PCR NEGATIVE NEGATIVE Final    Comment: (NOTE) The Xpert Xpress SARS-CoV-2/FLU/RSV plus assay is intended as an aid in the diagnosis of influenza from Nasopharyngeal swab specimens and should not be used as a sole basis for treatment. Nasal washings and aspirates are unacceptable for Xpert Xpress SARS-CoV-2/FLU/RSV testing.  Fact Sheet for Patients: EntrepreneurPulse.com.au  Fact Sheet for Healthcare Providers: IncredibleEmployment.be  This test is not yet approved or cleared by the [place] FDA and has been authorized for detection and/or diagnosis of SARS-CoV-2 by FDA under an Emergency Use Authorization (EUA). This EUA will remain in effect (meaning this test can be used) for the duration of the COVID-19 declaration under Section 564(b)(1) of the Act, 21 U.S.C. section 360bbb-3(b)(1), unless the authorization is terminated or revoked.  Performed at [facility], [place] [address], [place], [place] [address]   MRSA Next Gen by PCR, Nasal     Status: None   Collection Time: 09/16/20  8:23 PM   Specimen: Nasal Mucosa; Nasal Swab  Result Value Ref Range Status   MRSA by PCR Next Gen NOT DETECTED NOT DETECTED Final    Comment: (NOTE) The GeneXpert MRSA Assay (FDA approved for NASAL specimens only), is one component of a comprehensive MRSA colonization surveillance program. It is not intended to diagnose MRSA infection nor to guide or monitor treatment for MRSA infections. Test performance is not FDA approved in patients less than 84 years old. Performed at [facility], [place] [address], [place], [place] [address]          Radiology Studies: CT CHEST WO CONTRAST  Result  Date: 09/18/2020 CLINICAL DATA:  Respiratory failure and uncontrollable tremors. EXAM: CT CHEST WITHOUT CONTRAST TECHNIQUE: Multidetector CT imaging of the chest was performed following the standard protocol without IV contrast. COMPARISON:  None. FINDINGS: Cardiovascular: There is mild calcification of the aortic arch. Normal heart size with moderate to marked severity coronary artery calcification. No pericardial effusion. Mediastinum/Nodes: No enlarged mediastinal or axillary lymph nodes. Thyroid gland, trachea, and esophagus demonstrate no significant findings. Lungs/Pleura: Mild scarring and/or atelectasis is seen within the bilateral apices and posterior aspects of the bilateral lung bases. Small bilateral pleural effusions are noted. No pneumothorax is identified. Upper Abdomen: No acute abnormality. Musculoskeletal: Multiple sternal wires are present. A chronic compression fracture deformity is seen at the level of T6 and T10. IMPRESSION: 1. Small bilateral pleural effusions. 2. Mild biapical and mild bibasilar scarring and/or atelectasis. 3. Moderate to marked severity coronary artery calcification. 4. 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 5. Aortic  atherosclerosis. Aortic Atherosclerosis (ICD10-I70.0). Electronically Signed   By: Virgina Norfolk M.D.   On: 09/18/2020 20:05        Scheduled Meds:  ascorbic acid  500 mg Oral Daily   chlorhexidine  15 mL Mouth Rinse BID   Chlorhexidine Gluconate Cloth  6 each Topical Q0600   cholecalciferol  2,000 Units Oral Daily   dextromethorphan-guaiFENesin  1 tablet Oral BID   feeding supplement  237 mL Oral BID BM   folic acid  1 mg Oral Daily   mouth rinse  15 mL Mouth Rinse q12n4p   midodrine  5 mg Oral TID WC   multivitamin with minerals  1 tablet Oral Daily   omega-3 acid ethyl esters  1 g Oral Daily   Warfarin - Pharmacist Dosing Inpatient   Does not apply q1600   Continuous Infusions:  sodium chloride 50 mL/hr at 09/19/20 1734          Kathie Dike, MD [facility] 09/19/2020, 5:44 PM

## 2020-09-20 ENCOUNTER — Telehealth: Payer: Self-pay | Admitting: Pulmonary Disease

## 2020-09-20 DIAGNOSIS — R627 Adult failure to thrive: Secondary | ICD-10-CM | POA: Diagnosis not present

## 2020-09-20 DIAGNOSIS — G250 Essential tremor: Secondary | ICD-10-CM | POA: Diagnosis not present

## 2020-09-20 DIAGNOSIS — G9341 Metabolic encephalopathy: Secondary | ICD-10-CM | POA: Diagnosis present

## 2020-09-20 DIAGNOSIS — J9621 Acute and chronic respiratory failure with hypoxia: Secondary | ICD-10-CM | POA: Diagnosis not present

## 2020-09-20 DIAGNOSIS — R634 Abnormal weight loss: Secondary | ICD-10-CM | POA: Diagnosis present

## 2020-09-20 DIAGNOSIS — I471 Supraventricular tachycardia: Secondary | ICD-10-CM | POA: Diagnosis present

## 2020-09-20 DIAGNOSIS — E43 Unspecified severe protein-calorie malnutrition: Secondary | ICD-10-CM | POA: Diagnosis not present

## 2020-09-20 DIAGNOSIS — J9622 Acute and chronic respiratory failure with hypercapnia: Secondary | ICD-10-CM | POA: Diagnosis not present

## 2020-09-20 LAB — BLOOD GAS, ARTERIAL
Acid-Base Excess: 7.1 mmol/L — ABNORMAL HIGH (ref 0.0–2.0)
Acid-Base Excess: 10.6 mmol/L — ABNORMAL HIGH (ref 0.0–2.0)
Bicarbonate: 39.9 mmol/L — ABNORMAL HIGH (ref 20.0–28.0)
Bicarbonate: 38 mmol/L — ABNORMAL HIGH (ref 20.0–28.0)
Drawn by: 560031
FIO2: 28
FIO2: 30
O2 Content: 2 L/min
O2 Saturation: 98.1 %
O2 Saturation: 97.8 %
Patient temperature: 98.6
Patient temperature: 97.5
pCO2 arterial: 101 mmHg (ref 32.0–48.0)
pCO2 arterial: 61.4 mmHg — ABNORMAL HIGH (ref 32.0–48.0)
pH, Arterial: 7.223 — ABNORMAL LOW (ref 7.350–7.450)
pH, Arterial: 7.406 (ref 7.350–7.450)
pO2, Arterial: 122 mmHg — ABNORMAL HIGH (ref 83.0–108.0)
pO2, Arterial: 92.7 mmHg (ref 83.0–108.0)

## 2020-09-20 LAB — CBC
HCT: 46 % (ref 39.0–52.0)
Hemoglobin: 15.5 g/dL (ref 13.0–17.0)
MCH: 32.3 pg (ref 26.0–34.0)
MCHC: 33.7 g/dL (ref 30.0–36.0)
MCV: 95.8 fL (ref 80.0–100.0)
Platelets: 146 10*3/uL — ABNORMAL LOW (ref 150–400)
RBC: 4.8 MIL/uL (ref 4.22–5.81)
RDW: 14 % (ref 11.5–15.5)
WBC: 9.6 10*3/uL (ref 4.0–10.5)
nRBC: 0 % (ref 0.0–0.2)

## 2020-09-20 LAB — PROTIME-INR
INR: 1.2 (ref 0.8–1.2)
Prothrombin Time: 15 seconds (ref 11.4–15.2)

## 2020-09-20 LAB — COMPREHENSIVE METABOLIC PANEL
ALT: 31 U/L (ref 0–44)
AST: 23 U/L (ref 15–41)
Albumin: 3.5 g/dL (ref 3.5–5.0)
Alkaline Phosphatase: 43 U/L (ref 38–126)
Anion gap: 11 (ref 5–15)
BUN: 16 mg/dL (ref 8–23)
CO2: 32 mmol/L (ref 22–32)
Calcium: 9 mg/dL (ref 8.9–10.3)
Chloride: 94 mmol/L — ABNORMAL LOW (ref 98–111)
Creatinine, Ser: 0.61 mg/dL (ref 0.61–1.24)
GFR, Estimated: >60 mL/min (ref >60)
Glucose, Bld: 126 mg/dL — ABNORMAL HIGH (ref 70–99)
Potassium: 4 mmol/L (ref 3.5–5.1)
Sodium: 137 mmol/L (ref 135–145)
Total Bilirubin: 1.5 mg/dL — ABNORMAL HIGH (ref 0.3–1.2)
Total Protein: 5.6 g/dL — ABNORMAL LOW (ref 6.5–8.1)

## 2020-09-20 LAB — HEPARIN LEVEL (UNFRACTIONATED)
Heparin Unfractionated: 0.17 IU/mL — ABNORMAL LOW (ref 0.30–0.70)
Heparin Unfractionated: 0.46 IU/mL (ref 0.30–0.70)
Heparin Unfractionated: 0.48 IU/mL (ref 0.30–0.70)

## 2020-09-20 LAB — IGG, IGA, IGM
IgA: 169 mg/dL (ref 61–437)
IgG (Immunoglobin G), Serum: 392 mg/dL — ABNORMAL LOW (ref 603–1613)
IgM (Immunoglobulin M), Srm: 29 mg/dL (ref 15–143)

## 2020-09-20 LAB — MAGNESIUM: Magnesium: 2 mg/dL (ref 1.7–2.4)

## 2020-09-20 LAB — PHOSPHORUS: Phosphorus: 2.2 mg/dL — ABNORMAL LOW (ref 2.5–4.6)

## 2020-09-20 MED ORDER — METOPROLOL TARTRATE 5 MG/5ML IV SOLN
INTRAVENOUS | Status: AC
  Administered 2020-09-20: 5 mg via INTRAVENOUS
  Filled 2020-09-20: qty 5

## 2020-09-20 MED ORDER — HEPARIN BOLUS VIA INFUSION
1000.0000 [IU] | Freq: Once | INTRAVENOUS | Status: AC
  Administered 2020-09-20: 1000 [IU] via INTRAVENOUS
  Filled 2020-09-20: qty 1000

## 2020-09-20 MED ORDER — METOPROLOL TARTRATE 5 MG/5ML IV SOLN: 5.0000 mg | Freq: Once | INTRAVENOUS | Status: AC

## 2020-09-20 MED ORDER — METOPROLOL TARTRATE 12.5 MG HALF TABLET
12.5000 mg | ORAL_TABLET | Freq: Four times a day (QID) | ORAL | Status: DC
  Administered 2020-09-20 (×2): 12.5 mg via ORAL
  Filled 2020-09-20 (×2): qty 1

## 2020-09-20 MED ORDER — WARFARIN SODIUM 4 MG PO TABS
4.0000 mg | ORAL_TABLET | Freq: Once | ORAL | Status: AC
  Administered 2020-09-20: 4 mg via ORAL
  Filled 2020-09-20: qty 1

## 2020-09-20 MED ORDER — DILTIAZEM HCL 25 MG/5ML IV SOLN
10.0000 mg | Freq: Four times a day (QID) | INTRAVENOUS | Status: DC | PRN
  Filled 2020-09-20 (×2): qty 5

## 2020-09-20 MED ORDER — METOPROLOL TARTRATE 12.5 MG HALF TABLET: 12.5000 mg | ORAL_TABLET | Freq: Two times a day (BID) | ORAL | Status: DC

## 2020-09-20 NOTE — Progress Notes (Signed)
ANTICOAGULATION CONSULT NOTE - Follow Up Consult  Pharmacy Consult for warfarin & heparin  Indication: atrial fibrillation, mitral valve  Allergies  Allergen Reactions   Prednisone Other (See Comments)    Makes skin crawl, rapid HR   Cortisone Palpitations    Patient Measurements: Height: 5\' 7"  (170.2 cm) Weight: 45.4 kg (100 lb) IBW/kg (Calculated) : 66.1  Vital Signs: Temp: 97.9 F (36.6 C) (09/01 1157) Temp Source: Axillary (09/01 1157) BP: 125/92 (09/01 1300) Pulse Rate: 91 (09/01 1300)  Labs: Recent Labs    09/18/20 0235 09/19/20 0234 09/20/20 0348 09/20/20 1315  HGB 12.9* 13.5 15.5  --   HCT 41.1 42.0 46.0  --   PLT 118* 108* 146*  --   LABPROT 25.5* 17.2* 15.0  --   INR 2.3* 1.4* 1.2  --   HEPARINUNFRC  --   --  0.17* 0.46  CREATININE 0.55* 0.52* 0.61  --      Estimated Creatinine Clearance: 48.1 mL/min (by C-G formula based on SCr of 0.61 mg/dL).   Medications:  Scheduled:   ascorbic acid  500 mg Oral Daily   chlorhexidine  15 mL Mouth Rinse BID   Chlorhexidine Gluconate Cloth  6 each Topical Q0600   cholecalciferol  2,000 Units Oral Daily   dextromethorphan-guaiFENesin  1 tablet Oral BID   feeding supplement  237 mL Oral BID BM   folic acid  1 mg Oral Daily   magic mouthwash  10 mL Oral TID   mouth rinse  15 mL Mouth Rinse q12n4p   metoprolol tartrate  12.5 mg Oral QID   mirtazapine  7.5 mg Oral QHS   multivitamin with minerals  1 tablet Oral Daily   omega-3 acid ethyl esters  1 g Oral Daily   Ensure Max Protein  11 oz Oral Daily   warfarin  4 mg Oral ONCE-1600   Warfarin - Pharmacist Dosing Inpatient   Does not apply q1600   Infusions:   heparin 750 Units/hr (09/20/20 1331)   Home warfarin dosing unclear on med rec.  [facility] was contacted to clarify dosing.  Current regimen is 5mg  daily PLUS 1mg  once weekly. Unable to tell me what day of the week patient is supposed to take the 1mg , but said it was started on 08/09/20 due to DDI  with primidone.  Assessment: 59 yoM admitted on 8/28 with acute hypoxic and hypercarbic respite failure . PMH significant for chronic warfarin anticoagulation, AFib, s/p mitral valve annuloplasty and MAZE 2005.  Admit INR 10.9 and was given Vitamin K 2.5 mg PO. Pharmacy is consulted to resume warfarin dosing on 8/30.  Today, 09/20/2020: INR 1.2 Heparin level 0.46- therapeutic on IV heparin 750 units/hr CBC:  Hgb remains stable/wnl, Plt low but improved to 146 No bleeding or complications reported by RN.   DDI:  suspect that vitamin K will suppress INR for several days.  Note that patient was NOT taking primidone prior to admission.  Diet:  regular   Goal of Therapy:  Heparin level 0.3-0.7 INR 2-3 Monitor platelets by anticoagulation protocol: Yes   Plan:  Continue heparin IV infusion at 750 units/hr Recheck confirmatory heparin level in 8h Warfarin 4 mg PO x 1. Daily PT/INR, heparin level & CBC Gretta Arab PharmD, BCPS Clinical Pharmacist [facility] main pharmacy [phone] 09/20/2020 2:17 PM

## 2020-09-20 NOTE — Progress Notes (Signed)
Telemetry monitor frequently showing PVC's and Vtach. Patient alert, oriented, denies chest pain, no evidence of distress. EKG done. Provider on call informed.

## 2020-09-20 NOTE — Progress Notes (Signed)
ANTICOAGULATION CONSULT NOTE - Follow Up Consult  Pharmacy Consult for warfarin & heparin  Indication: atrial fibrillation, mitral valve  Allergies  Allergen Reactions   Prednisone Other (See Comments)    Makes skin crawl, rapid HR   Cortisone Palpitations    Patient Measurements: Height: 5\' 7"  (170.2 cm) Weight: 45.4 kg (100 lb) IBW/kg (Calculated) : 66.1  Vital Signs: Temp: 97.5 F (36.4 C) (09/01 2000) Temp Source: Axillary (09/01 2000) BP: 115/82 (09/01 2045) Pulse Rate: 83 (09/01 2145)  Labs: Recent Labs    09/18/20 0235 09/19/20 0234 09/20/20 0348 09/20/20 1315 09/20/20 2115  HGB 12.9* 13.5 15.5  --   --   HCT 41.1 42.0 46.0  --   --   PLT 118* 108* 146*  --   --   LABPROT 25.5* 17.2* 15.0  --   --   INR 2.3* 1.4* 1.2  --   --   HEPARINUNFRC  --   --  0.17* 0.46 0.48  CREATININE 0.55* 0.52* 0.61  --   --      Estimated Creatinine Clearance: 48.1 mL/min (by C-G formula based on SCr of 0.61 mg/dL).   Medications:  Scheduled:   ascorbic acid  500 mg Oral Daily   chlorhexidine  15 mL Mouth Rinse BID   Chlorhexidine Gluconate Cloth  6 each Topical Q0600   cholecalciferol  2,000 Units Oral Daily   dextromethorphan-guaiFENesin  1 tablet Oral BID   feeding supplement  237 mL Oral BID BM   folic acid  1 mg Oral Daily   magic mouthwash  10 mL Oral TID   mouth rinse  15 mL Mouth Rinse q12n4p   mirtazapine  7.5 mg Oral QHS   multivitamin with minerals  1 tablet Oral Daily   omega-3 acid ethyl esters  1 g Oral Daily   Ensure Max Protein  11 oz Oral Daily   Warfarin - Pharmacist Dosing Inpatient   Does not apply q1600   Infusions:   heparin 750 Units/hr (09/20/20 1751)   Home warfarin dosing unclear on med rec.  [facility] was contacted to clarify dosing.  Current regimen is 5mg  daily PLUS 1mg  once weekly. Unable to tell me what day of the week patient is supposed to take the 1mg , but said it was started on 08/09/20 due to DDI with  primidone.  Assessment: 58 yoM admitted on 8/28 with acute hypoxic and hypercarbic respite failure . PMH significant for chronic warfarin anticoagulation, AFib, s/p mitral valve annuloplasty and MAZE 2005.  Admit INR 10.9 and was given Vitamin K 2.5 mg PO. Pharmacy is consulted to resume warfarin dosing on 8/30.  Today, 09/20/2020: INR 1.2 Heparin level 0.46- therapeutic on IV heparin 750 units/hr CBC:  Hgb remains stable/wnl, Plt low but improved to 146 No bleeding or complications reported by RN.   DDI:  suspect that vitamin K will suppress INR for several days.  Note that patient was NOT taking primidone prior to admission.  Diet:  regular   PM Update: Confirmatory heparin level = 0.48 (therapeutic) with heparin gtt @ 750 units/hr No complications of therapy noted  Goal of Therapy:  Heparin level 0.3-0.7 INR 2-3 Monitor platelets by anticoagulation protocol: Yes   Plan:  Continue heparin IV infusion at 750 units/hr Daily PT/INR, heparin level & CBC  Leone Haven, PharmD 09/20/2020 10:42 PM

## 2020-09-20 NOTE — Progress Notes (Signed)
BUE soft wrist restraints ordered at 0012 but never actually placed on patient as they were not needed.

## 2020-09-20 NOTE — TOC Progression Note (Signed)
Transition of Care [facility]) - Progression Note    Patient Details  Name: Austin Davis MRN: [id number] Date of Birth: 21-Nov-1940  [facility] CM/SW Contact  Leeroy Cha, RN Phone Number: 09/20/2020, 7:42 AM  Clinical Narrative:    Triology vent ordered through adapt dme for patient.    Expected Discharge Plan: [place] Barriers to Discharge: Continued Medical Work up  Expected Discharge Plan and Services Expected Discharge Plan: [place]   Discharge Planning Services: CM Consult   Living arrangements for the past 2 months: Single Family Home                                       Social Determinants of Health (SDOH) Interventions    Readmission Risk Interventions No flowsheet data found.

## 2020-09-20 NOTE — Progress Notes (Signed)
Physical Therapy Treatment Patient Details Name: Austin Davis MRN: [id number] DOB: August 12, 1940 Today's Date: 09/20/2020    History of Present Illness 80 year old male never smoker with OSA non-adherent to therapy, hx mitral valve repair,chronic combined heart failure, atrial fibrillation, tremors, and general weakness who presents with acute hypercarbic respiratory failure requiring BiPAP. CT Chest with no evidence of infection or other causes to contribute to his dyspnea. The etiology of his chronic respiratory failure is unclear as he does not have evidence of emphysema or stigmata of OHS. Also question his diagnosis of OSA as his sleep study commented on >200 apnea and hypopneas however this was a home sleep study so unable to confirm if true obstructive apneas.  He does have significant failure to thrive and general weakness that is concerning for hypoventilation secondary to severe deconditioning/neuro?muscular weakness.    PT Comments    Pt seen in ICU.  Currently on 3 lts nasal sats 92% with HR 122.  Pt required increased time and assist just to get OOB to recliner.  Pt NOT progressing well enough to return home.  Will update LPT pt is more SNF appropriate.  Follow Up Recommendations  SNF     Equipment Recommendations  Rolling walker with 5" wheels    Recommendations for Other Services       Precautions / Restrictions Precautions Precautions: Fall Precaution Comments: monitor sats    Mobility  Bed Mobility Overal bed mobility: Needs Assistance Bed Mobility: Supine to Sit     Supine to sit: Max assist;Total assist     General bed mobility comments: attempted however too weak to self complete.  Esp difficult to self scoot to EOB.  Only tolerated sitting EOB x 3 min and required MinGuard Assist to prevent posterior LOB.    Transfers   Equipment used: None Transfers: Risk manager Sit to Stand: Max assist;Total assist         General transfer comment: pt  was too weak to attempt sit to stand with walker.  Assisted to recliner via "Bear Hug".   Pt was able to stand and support his body weight but for only a brief time and needed increased assist to complete 1/4 turn to recliner.  MAX c/o weakness and "no strength".  Ambulation/Gait             General Gait Details: transfers only due to propfound weakness   Stairs             Wheelchair Mobility    Modified Rankin (Stroke Patients Only)       Balance                                            Cognition Arousal/Alertness: Awake/alert Behavior During Therapy: WFL for tasks assessed/performed;Flat affect Overall Cognitive Status: No family/caregiver present to determine baseline cognitive functioning                                 General Comments: AxO x 2 pulling at cords when I asked him what he was doing, he replied "trying to hang myself".  Pt aware he is in the hospital in [place].  He stated, "I am too weak to do anything".  Pt did cooperate and assist as much as he could to get OOB to recliner with  MAX c/o weakness/fatigue.      Exercises      General Comments        Pertinent Vitals/Pain Pain Assessment: No/denies pain    Home Living                      Prior Function            PT Goals (current goals can now be found in the care plan section) Progress towards PT goals: Progressing toward goals    Frequency    Min 3X/week      PT Plan Current plan remains appropriate    Co-evaluation              AM-PAC PT "6 Clicks" Mobility   Outcome Measure  Help needed turning from your back to your side while in a flat bed without using bedrails?: A Lot Help needed moving from lying on your back to sitting on the side of a flat bed without using bedrails?: A Lot Help needed moving to and from a bed to a chair (including a wheelchair)?: A Lot Help needed standing up from a chair using your arms  (e.g., wheelchair or bedside chair)?: Total Help needed to walk in hospital room?: Total Help needed climbing 3-5 steps with a railing? : Total 6 Click Score: 9    End of Session Equipment Utilized During Treatment: Gait belt;Oxygen Activity Tolerance: Patient limited by fatigue Patient left: in chair;with call bell/phone within reach;with chair alarm set Nurse Communication: Mobility status PT Visit Diagnosis: Unsteadiness on feet (R26.81);Muscle weakness (generalized) (M62.81);Difficulty in walking, not elsewhere classified (R26.2)     Time: 3664-4034 PT Time Calculation (min) (ACUTE ONLY): 25 min  Charges:  $Therapeutic Activity: 23-37 mins                    Rica Koyanagi  PTA Acute  Rehabilitation Services Pager      [phone] Office      [phone]

## 2020-09-20 NOTE — Telephone Encounter (Signed)
I called and spoke with Austin Davis, who is on DPR, regarding message. She stated that she has found blood mold in the bathroom and on the inside of the window and is wondering if this could have an effect on the patient's breathing/ respiratory issues. I told Collie Siad that im sure it certainly doesn't help but I will route this message to [facility] for FYI.  Dr. Silas Flood, just FYI, patient is also in hospital at [facility] at the moment. Thanks!

## 2020-09-20 NOTE — Progress Notes (Signed)
NAME:  Austin Davis, MRN:  [id number], DOB:  [birth year], LOS: 4 ADMISSION DATE:  09/16/2020 CONSULTATION DATE:  09/17/2020 REFERRING MD:  Austin Davis - TR[facility] CHIEF COMPLAINT:  Hypercarbic respiratory failure   History of Present Illness:  [name] is seen in consultation at the request of Dr. Starla Davis for recommendations on further evaluation and management of hypercarbic respiratory failure.  Patient is a 80 year old man who presented to [facility] ED 8/29 with SOB, dyspnea and fatigue/malaise, worsening over the last several weeks prior to admission. PMHx significant for HLD, PAF, MVP s/p MV annuloplasty (2005, on Coumadin), PAD, CAD, OSA (on CPAP, noncompliant), history of GIB (2019).  On admission, patient reported general decline of his wellness over the past 1-2 years.  He tells me that he locked down for COVID and noted some weight gain from lack of exercise.  He started trying to eat a healthier diet and initially noted about 10 pounds of weight loss, but then noted he could no longer complete tasks he used to do with ease such as mowing the lawn without a significant shortness of breath and then continued to lose weight without effort (approximately 75lb) with progressive generalized weakness, malaise, lack of appetite, noting that his favorite foods taste strange or do not taste appetizing anymore with exertional shortness of breath.  He noted a very bad respiratory illness early 2019 that he never had a good etiology for.  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   He was diagnosed with OSA 10/2019 and CPAP was ordered but not delivered until 07/2020 due to supply issues; he reports significant issues with mask fit/discomfort and associated claustrophobia, especially while lying flat. As a result,  he has only attempted to wear his mask a handful of times since receiving his machine.  Pertinent Medical History:   Past Medical History:  Diagnosis Date   CHF (congestive heart failure) ([place])    Coronary artery disease    GI bleed 2019   hospitalized at [facility] for one week   Headache    since childhood   Low blood sugar    since childhood, controlled by diet   Mitral regurgitation    Mitral valve prolapse    Osteopenia 2021   Paroxysmal atrial fibrillation ([place])    Significant Hospital Events: Including procedures, antibiotic start and stop dates in addition to other pertinent events   8/28 - Presented to [facility] ED for SOB, dyspnea. Admitted for hypercarbic RF. 8/29 - PCCM consulted for hypercarbic RF requiring BiPAP. Nutrition consult. 8/31 - On room air, tolerating bipap intermittently at night, CT chest without significant findings 9/01 - Refused CPAP overnight, tolerating this morning.Ongoing anxiety/claustrophobia with mask.   Interim History / Subjective:  Refused CPAP overnight Has tolerated for ~2hr previous nights Ongoing anxiety Ongoing mild SOB/dyspnea with conversation, exertion  Objective:  Blood pressure 106/75, pulse (!) 103, temperature 97.7 F (36.5 C), temperature source Oral, resp. rate (!) 22, height 5\' 7"  (1.702 m), weight 45.4 kg, SpO2 97 %.        Intake/Output Summary (Last 24 hours) at 09/20/2020 1023 Last data filed at 09/20/2020 0949 Gross per 24 hour  Intake 955.76 ml  Output 1950 ml  Net -994.24 ml    Filed Weights   09/16/20 1450  Weight:  45.4 kg   Physical Examination: General: Chronically ill-appearing elderly man in NAD. Cachectic. Appears anxious. HEENT: [place]/AT, anicteric sclera, PERRL, dry mucous membranes. Temporal wasting noted. Neuro: Awake, oriented x 4. Responds to verbal stimuli. Following commands consistently. Moves all 4 extremities spontaneously. CV: Sinus tachycardia to 100s, no m/g/r. PULM: Breathing even and  unlabored on CPAP. Lung fields with fair air movement bilaterally. No wheeze/rhonchi/rales. GI: Soft, nontender, nondistended. Normoactive bowel sounds. Extremities: No LE edema noted. Skin: Warm/dry, no rashes.  Resolved Hospital Problem List:    Assessment & Plan:   80 year old man who presented to [facility] ED 8/29 with SOB, dyspnea and fatigue/malaise, worsening over the last year and acutely several weeks prior to admission. PMHx significant for HLD, PAF, MVP s/p MV annuloplasty (2005, on Coumadin), PAD, CAD, OSA (on CPAP, noncompliant), history of GIB (2019).  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  His profound weight loss and progressive exertional dyspnea is concerning. Home sleep study showed 200 episodes of apnea8/30 CT Chest without evidence of lung disease, shows mild scarring and pleural effusions. Etiology of profound weight loss, hypoventilation and generalized weakness is not completely clear; mentions depression during COVID lockdown and seems to be barely eating.  Consider neuromuscular disease - has seen neurology and was diagnosed with essential tremor, no bulbar symptoms or upper motor neuron symptoms to suggest ALS. Plan: - Continue BiPAP vs. CPAP support QHS and PRN - Continue supplemental O2, wean for O2 sat > 90% - Meets criteria for Trilogy home vent - Pulmonary hygiene - Continue home Xanax PRN for NIV mask-associated claustrophobia - PT/OT/SLP therapy consults - Nutrition consulted, following - SW/CM/TOC following, appreciate assistance with obtaining home Trilogy vent - Consider formal repeat of OSA testing in sleep lab - Repeat formal PFTs as outpatient, reschedule outpatient pulm f/u (Dr. 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  Previous ABGs have documented high pCO2.  Patient  would benefit from non-invasive ventilation.  Without this therapy, the patient is at high risk of ending up with worsening symptoms, worsened respiratory failure, need for ER visits and/or recurrent hospitalizations.  Bilevel device is unable to adequately support patient's nocturnal ventilation needs.  Patient would benefit from NIV therapy with set tidal volumes and pressure.  Trilogy Parameters:  [X]  VAPS VT: 6cc/kg IBW, RR 8-15 BPM, IPAP Min 8-12, IPAP Max 12-32, EPAP 4-12, AVAPS Rate 3-5  [X]  AVAPS AE VT: 6-8 cc/kg IBW, RR 8-15 BPM, PS Min 4-10, PS Max 8-20, EPAP Min 4-12, EPAP Max 12-20, Max Pressure 32, AVAPS Rate 3-5  [X]  Dual Setting for Exertion or Respiratory Distress VT: 10cc/kg IBW, RR 14-16, AVAPS Rate 5  [X]  Set Volume, Pressure and Rate Alarms per patient condition [X]  Fit and modify mask as needed  Best Practice: (right click and "Reselect all SmartList Selections" daily)   Per Primary Team   Critical care time: N/A   Rhae Lerner [facility] 09/20/20 10:49 AM  Please see [email or web address] for pager details.  From 7A-7P if no response, please call [phone] After hours, please call ELink [phone]

## 2020-09-20 NOTE — Progress Notes (Signed)
Patient ID: Austin Davis, male   DOB: Oct 04, 1940, 80 y.o.   MRN: [id number]  PROGRESS NOTE    ASHER TORPEY  KZS:[id number] DOB: 05/08/1940 DOA: 09/16/2020 PCP: [name], MD   Brief Narrative:  80 y.o. male with medical history significant for paroxysmal atrial fibrillation, mitral valve annuloplasty and maze procedure in 2005 on Coumadin, PAD, history of GI bleed, essential tremor, OSA not able to tolerate CPAP and hyperlipidemia, progressive health decline along with weight loss over the last year presented with increasing shortness of breath.  On presentation, he was tachycardic and slightly hypotensive and hypoxic with oxygen saturations below 90.  On initial ABG, he was acidotic with pH of 7.227 with PCO2 of 90.  He was started on BiPAP.  Chest x-ray was negative for infiltrates.  INR was 10.9 with no overt bleeding.  He was given vitamin K in the ED.  Assessment & Plan:   Acute hypoxic and hypercarbic respite failure  -Patient was hypoxic and hypercapnic on presentation.  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 -Chest x-ray negative for infiltrates.  COVID and influenza testing negative on presentation.  Required BiPAP on presentation. -Pulmonary following: Recommend to continue nocturnal ventilation nightly. -He is being set up with a home trilogy noninvasive ventilator -Echo showed EF of 45 to 50% with grade 1 diastolic dysfunction -Repeat CT scan on 8/30 does show small bilateral pleural effusions.  He does have some lower extremity edema.  BNP elevated at 876.  He did receive 1 dose of Lasix on 8/31 with fair urine output.  Clinically does not appear to be volume overloaded at this point -On 9/1, he was noted to be lethargic, ABG repeated and noted to have pH of 7.22 with a PCO2 101 -Placed back on BiPAP  Acute metabolic encephalopathy -Increasing lethargy today due to hypercapnia -Started back on BiPAP -Repeat ABG  pending  Hypotension -Normal cortisol level  -Currently on midodrine -Blood pressures appear to be in normal range  Failure to thrive/generalized weakness/severe malnutrition -Follow nutrition recommendations.  PT recommends home and PT versus SNF.  TOC consult. -had work up with endocrinology in June 2022 and had PET scan showing no evidence of paraganglioma with physiologic activity within the adrenal glands. Unfortunately no other documentation found in Epic. Recommend f/u with endocrinology outpatient  -Palliative care input appreciated, will need outpatient palliative care to follow after discharge to help address formal advanced care planning.  Currently, patient is requesting full scope medical treatment -Discussed in detail with Dr. Hilma Favors and underlying process to explain his general failure to thrive with pulm toilet clear -We will plan on further neuroimaging with CT head and possible brain MRI if patient tolerates this. -We will likely need to get neurology input once neuroimaging is complete.  Supratherapeutic INR/history of mitral annuloplasty Paroxysmal A. fib status post maze procedure -INR 10.9 on presentation.  Status post vitamin K given in the ED.  INR is down to 1.4 today.  Coumadin has been resumed.  We will start on heparin bridge.  Monitor daily INR.  Consult pharmacy. -Echo as above. -Outpatient follow-up with cardiology.  Documented to have intolerance to beta-blocker in the past.   -He has been having episodes of SVT today. -Some improvement after receiving IV Lopressor.  Prior records reviewed indicating that he does not tolerate beta-blockers and becomes fatigued/lethargic -Changed rate control to IV diltiazem  Essential tremor -Followed by neurology.  Could not  tolerate beta-blockers in the past.  Apparently declined gabapentin -Continue as needed as needed Xanax.  Outpatient follow-up with PCP/neurology  Right subconjunctival hemorrhage -Continue to  monitor  Stage I pressure sacral ulcer: Present on admission -Continue local wound care  DVT prophylaxis: Heparin infusion/Coumadin Code Status: Full Family Communication: Discussed with significant other at the bedside Disposition Plan: Status is: Inpatient  Remains inpatient appropriate because:Inpatient level of care appropriate due to severity of illness  Dispo: The patient is from: Home              Anticipated d/c is to: Home versus skilled nursing facility pending progression              Patient currently is not medically stable to d/c.   Difficult to place patient No   Consultants: PCCM/palliative care  Procedures: Echo  Antimicrobials: None   Subjective: Patient has been somnolent today.  It was noted that he seemed paranoid/agitated overnight.  He has been tachycardic today.  He was briefly wake up to voice, but then falls back asleep.  Objective: Vitals:   09/20/20 1500 09/20/20 1600 09/20/20 1700 09/20/20 1800  BP: (!) 128/96 105/88 (!) 137/112 119/87  Pulse: (!) 105 (!) 101 (!) 121 (!) 107  Resp: (!) 35 (!) 33 (!) 29 (!) 32  Temp:  (!) 97.4 F (36.3 C)    TempSrc:  Axillary    SpO2: 99% 98% 96% 99%  Weight:      Height:        Intake/Output Summary (Last 24 hours) at 09/20/2020 1912 Last data filed at 09/20/2020 1751 Gross per 24 hour  Intake 589.95 ml  Output 1820 ml  Net -1230.05 ml   Filed Weights   09/16/20 1450  Weight: 45.4 kg    Examination:  General exam: Somnolent, cachectic Respiratory system: Clear to auscultation. Respiratory effort normal. Cardiovascular system:RRR. No murmurs, rubs, gallops. Gastrointestinal system: Abdomen is nondistended, soft and nontender. No organomegaly or masses felt. Normal bowel sounds heard. Central nervous system:somnolent, noted to have tremor in his jaw as well as his right hand. Extremities: Trace edema lower extremities bilaterally Skin: No rashes, lesions or ulcers Psychiatry: somnolent   Data  Reviewed: I have personally reviewed following labs and imaging studies  CBC: Recent Labs  Lab 09/16/20 1512 09/17/20 0439 09/18/20 0235 09/19/20 0234 09/20/20 0348  WBC 8.5 7.3 7.9 8.2 9.6  NEUTROABS 7.0  --  6.3 6.7  --   HGB 17.8* 13.5 12.9* 13.5 15.5  HCT 55.1* 40.5 41.1 42.0 46.0  MCV 98.2 97.4 101.7* 99.3 95.8  PLT 166 104* 118* 108* 222*   Basic Metabolic Panel: Recent Labs  Lab 09/16/20 1512 09/17/20 0439 09/17/20 1913 09/18/20 0235 09/19/20 0234 09/20/20 0348  NA 140 138  --  140 139 137  K 4.5 4.5  --  4.3 4.1 4.0  CL 91* 99  --  100 97* 94*  CO2 36* 27  --  36* 34* 32  GLUCOSE 123* 80  --  112* 111* 126*  BUN 34* 31*  --  25* 21 16  CREATININE 0.80 0.81  --  0.55* 0.52* 0.61  CALCIUM 9.4 8.5*  --  8.6* 8.7* 9.0  MG  --   --   --  2.3  --  2.0  PHOS  --   --  2.7  --   --  2.2*   GFR: Estimated Creatinine Clearance: 48.1 mL/min (by C-G formula based on SCr of 0.61 mg/dL). Liver  Function Tests: Recent Labs  Lab 09/16/20 1512 09/18/20 0235 09/20/20 0348  AST 34 25 23  ALT 43 35 31  ALKPHOS 46 37* 43  BILITOT 1.4* 0.9 1.5*  PROT 6.7 4.9* 5.6*  ALBUMIN 4.4 3.2* 3.5   Recent Labs  Lab 09/16/20 1513  LIPASE 36   No results for input(s): AMMONIA in the last 168 hours. Coagulation Profile: Recent Labs  Lab 09/16/20 1513 09/17/20 0439 09/18/20 0235 09/19/20 0234 09/20/20 0348  INR 10.9* 6.4* 2.3* 1.4* 1.2   Cardiac Enzymes: No results for input(s): CKTOTAL, CKMB, CKMBINDEX, TROPONINI in the last 168 hours. BNP (last 3 results) No results for input(s): PROBNP in the last 8760 hours. HbA1C: No results for input(s): HGBA1C in the last 72 hours. CBG: No results for input(s): GLUCAP in the last 168 hours. Lipid Profile: No results for input(s): CHOL, HDL, LDLCALC, TRIG, CHOLHDL, LDLDIRECT in the last 72 hours. Thyroid Function Tests: Recent Labs    09/18/20 0235  TSH 1.538   Anemia Panel: Recent Labs    09/18/20 0235  VITAMINB12 821    Sepsis Labs: No results for input(s): PROCALCITON, LATICACIDVEN in the last 168 hours.  Recent Results (from the past 240 hour(s))  Resp Panel by RT-PCR (Flu A&B, Covid) Nasopharyngeal Swab     Status: None   Collection Time: 09/16/20  3:22 PM   Specimen: Nasopharyngeal Swab; Nasopharyngeal(NP) swabs in vial transport medium  Result Value Ref Range Status   SARS Coronavirus 2 by RT PCR NEGATIVE NEGATIVE Final    Comment: (NOTE) SARS-CoV-2 target nucleic acids are NOT DETECTED.  The SARS-CoV-2 RNA is generally detectable in upper respiratory specimens during the acute phase of infection. The lowest concentration of SARS-CoV-2 viral copies this assay can detect is 138 copies/mL. A negative result does not preclude SARS-Cov-2 infection and should not be used as the sole basis for treatment or other patient management decisions. A negative result may occur with  improper specimen collection/handling, submission of specimen other than nasopharyngeal swab, presence of viral mutation(s) within the areas targeted by this assay, and inadequate number of viral copies(<138 copies/mL). A negative result must be combined with clinical observations, patient history, and epidemiological information. The expected result is Negative.  Fact Sheet for Patients:  EntrepreneurPulse.com.au  Fact Sheet for Healthcare Providers:  IncredibleEmployment.be  This test is no t yet approved or cleared by the [place] FDA and  has been authorized for detection and/or diagnosis of SARS-CoV-2 by FDA under an Emergency Use Authorization (EUA). This EUA will remain  in effect (meaning this test can be used) for the duration of the COVID-19 declaration under Section 564(b)(1) of the Act, 21 U.S.C.section 360bbb-3(b)(1), unless the authorization is terminated  or revoked sooner.       Influenza A by PCR NEGATIVE NEGATIVE Final   Influenza B by PCR NEGATIVE NEGATIVE  Final    Comment: (NOTE) The Xpert Xpress SARS-CoV-2/FLU/RSV plus assay is intended as an aid in the diagnosis of influenza from Nasopharyngeal swab specimens and should not be used as a sole basis for treatment. Nasal washings and aspirates are unacceptable for Xpert Xpress SARS-CoV-2/FLU/RSV testing.  Fact Sheet for Patients: EntrepreneurPulse.com.au  Fact Sheet for Healthcare Providers: IncredibleEmployment.be  This test is not yet approved or cleared by the [place] FDA and has been authorized for detection and/or diagnosis of SARS-CoV-2 by FDA under an Emergency Use Authorization (EUA). This EUA will remain in effect (meaning this test can be used) for the duration of  the COVID-19 declaration under Section 564(b)(1) of the Act, 21 U.S.C. section 360bbb-3(b)(1), unless the authorization is terminated or revoked.  Performed at [facility], [place] [address], [place], [place] [address]   MRSA Next Gen by PCR, Nasal     Status: None   Collection Time: 09/16/20  8:23 PM   Specimen: Nasal Mucosa; Nasal Swab  Result Value Ref Range Status   MRSA by PCR Next Gen NOT DETECTED NOT DETECTED Final    Comment: (NOTE) The GeneXpert MRSA Assay (FDA approved for NASAL specimens only), is one component of a comprehensive MRSA colonization surveillance program. It is not intended to diagnose MRSA infection nor to guide or monitor treatment for MRSA infections. Test performance is not FDA approved in patients less than 81 years old. Performed at [facility], [place] [address], [place],  [address]          Radiology Studies: No results found.      Scheduled Meds:  ascorbic acid  500 mg Oral Daily   chlorhexidine  15 mL Mouth Rinse BID   Chlorhexidine Gluconate Cloth  6 each Topical Q0600   cholecalciferol  2,000 Units Oral Daily   dextromethorphan-guaiFENesin  1 tablet Oral BID   feeding  supplement  237 mL Oral BID BM   folic acid  1 mg Oral Daily   magic mouthwash  10 mL Oral TID   mouth rinse  15 mL Mouth Rinse q12n4p   mirtazapine  7.5 mg Oral QHS   multivitamin with minerals  1 tablet Oral Daily   omega-3 acid ethyl esters  1 g Oral Daily   Ensure Max Protein  11 oz Oral Daily   Warfarin - Pharmacist Dosing Inpatient   Does not apply q1600   Continuous Infusions:  heparin 750 Units/hr (09/20/20 1751)          Kathie Dike, MD [facility] 09/20/2020, 7:12 PM

## 2020-09-20 NOTE — Progress Notes (Signed)
ANTICOAGULATION CONSULT NOTE - Follow Up Consult  Pharmacy Consult for warfarin & heparin  Indication: atrial fibrillation  Allergies  Allergen Reactions   Prednisone Other (See Comments)    Makes skin crawl, rapid HR   Cortisone Palpitations    Patient Measurements: Height: 5\' 7"  (170.2 cm) Weight: 45.4 kg (100 lb) IBW/kg (Calculated) : 66.1  Vital Signs: Temp: 97.7 F (36.5 C) (09/01 0310) Temp Source: Oral (09/01 0310) BP: 130/86 (09/01 0300) Pulse Rate: 113 (09/01 0315)  Labs: Recent Labs    09/18/20 0235 09/19/20 0234 09/20/20 0348  HGB 12.9* 13.5 15.5  HCT 41.1 42.0 46.0  PLT 118* 108* 146*  LABPROT 25.5* 17.2* 15.0  INR 2.3* 1.4* 1.2  HEPARINUNFRC  --   --  0.17*  CREATININE 0.55* 0.52* 0.61     Estimated Creatinine Clearance: 48.1 mL/min (by C-G formula based on SCr of 0.61 mg/dL).   Medications:  Scheduled:   ascorbic acid  500 mg Oral Daily   chlorhexidine  15 mL Mouth Rinse BID   Chlorhexidine Gluconate Cloth  6 each Topical Q0600   cholecalciferol  2,000 Units Oral Daily   dextromethorphan-guaiFENesin  1 tablet Oral BID   feeding supplement  237 mL Oral BID BM   folic acid  1 mg Oral Daily   magic mouthwash  10 mL Oral TID   mouth rinse  15 mL Mouth Rinse q12n4p   midodrine  5 mg Oral TID WC   mirtazapine  7.5 mg Oral QHS   multivitamin with minerals  1 tablet Oral Daily   omega-3 acid ethyl esters  1 g Oral Daily   Ensure Max Protein  11 oz Oral Daily   Warfarin - Pharmacist Dosing Inpatient   Does not apply q1600   Infusions:   heparin 650 Units/hr (09/20/20 0000)   Home warfarin dosing unclear on med rec.  [facility] was contacted to clarify dosing.  Current regimen is 5mg  daily PLUS 1mg  once weekly. Unable to tell me what day of the week patient is supposed to take the 1mg , but said it was started on 08/09/20 due to DDI with primidone.  Assessment: 51 yoM admitted on 8/28 with acute hypoxic and hypercarbic respite failure .  PMH significant for chronic warfarin anticoagulation, AFib, s/p mitral valve annuloplasty and MAZE 2005.  Admit INR 10.9 and was given Vitamin K 2.5 mg PO. Pharmacy is consulted to resume warfarin dosing on 8/30.  Today, 09/20/2020: INR 1.2 Heparin level 0.17- subtherapeutic on IV heparin 650 units/hr CBC:  Hgb remains stable/wnl, Plt low but improved to 146 No bleeding or complications reported by RN.   Patient pulled out IV around midnight & heparin was off for ~19min.   DDI:  suspect that vitamin K will suppress INR for several days.  Note that patient was NOT taking primidone prior to admission.  Diet:  regular   Goal of Therapy:  Heparin level 0.3-0.7 INR 2-3 Monitor platelets by anticoagulation protocol: Yes   Plan:  Rebolus heparin 1000 units IV x1 then increase infusion rate to 750 units/hr Recheck heparin level in 8h Warfarin 4 mg PO x 1. Daily PT/INR, heparin level & CBC  Netta Cedars PharmD, BCPS Clinical Pharmacist [facility] main pharmacy [phone] 09/20/2020 4:30 AM

## 2020-09-21 ENCOUNTER — Inpatient Hospital Stay (HOSPITAL_COMMUNITY): Payer: Medicare Other

## 2020-09-21 DIAGNOSIS — R627 Adult failure to thrive: Secondary | ICD-10-CM | POA: Diagnosis not present

## 2020-09-21 DIAGNOSIS — J9621 Acute and chronic respiratory failure with hypoxia: Secondary | ICD-10-CM | POA: Diagnosis not present

## 2020-09-21 DIAGNOSIS — G250 Essential tremor: Secondary | ICD-10-CM | POA: Diagnosis not present

## 2020-09-21 DIAGNOSIS — E43 Unspecified severe protein-calorie malnutrition: Secondary | ICD-10-CM | POA: Diagnosis not present

## 2020-09-21 DIAGNOSIS — J9622 Acute and chronic respiratory failure with hypercapnia: Secondary | ICD-10-CM | POA: Diagnosis not present

## 2020-09-21 LAB — BASIC METABOLIC PANEL
Anion gap: 7 (ref 5–15)
BUN: 30 mg/dL — ABNORMAL HIGH (ref 8–23)
CO2: 42 mmol/L — ABNORMAL HIGH (ref 22–32)
Calcium: 9.1 mg/dL (ref 8.9–10.3)
Chloride: 92 mmol/L — ABNORMAL LOW (ref 98–111)
Creatinine, Ser: 0.8 mg/dL (ref 0.61–1.24)
GFR, Estimated: >60 mL/min (ref >60)
Glucose, Bld: 127 mg/dL — ABNORMAL HIGH (ref 70–99)
Potassium: 3.8 mmol/L (ref 3.5–5.1)
Sodium: 141 mmol/L (ref 135–145)

## 2020-09-21 LAB — CBC
HCT: 48 % (ref 39.0–52.0)
Hemoglobin: 15.2 g/dL (ref 13.0–17.0)
MCH: 32.1 pg (ref 26.0–34.0)
MCHC: 31.7 g/dL (ref 30.0–36.0)
MCV: 101.5 fL — ABNORMAL HIGH (ref 80.0–100.0)
Platelets: 159 10*3/uL (ref 150–400)
RBC: 4.73 MIL/uL (ref 4.22–5.81)
RDW: 14.2 % (ref 11.5–15.5)
WBC: 7.6 10*3/uL (ref 4.0–10.5)
nRBC: 0 % (ref 0.0–0.2)

## 2020-09-21 LAB — HEPARIN LEVEL (UNFRACTIONATED): Heparin Unfractionated: 0.45 IU/mL (ref 0.30–0.70)

## 2020-09-21 LAB — PROTIME-INR
INR: 1.4 — ABNORMAL HIGH (ref 0.8–1.2)
Prothrombin Time: 17.1 seconds — ABNORMAL HIGH (ref 11.4–15.2)

## 2020-09-21 IMAGING — CT CT HEAD W/O CM
4 series · 17 of 47 positions shown, 19 images · non-contrast
Comparison: None.

CLINICAL DATA: Vertigo, central.

EXAM:
CT HEAD WITHOUT CONTRAST
TECHNIQUE: Contiguous axial images were obtained from the base of the skull
through the vertex without intravenous contrast.

[Series 2: head wo · axial · 0.47mm/px · z∈[+1636,+1771]mm · 7 of 37 slices shown, 9 images]
[im 5/37  brain]
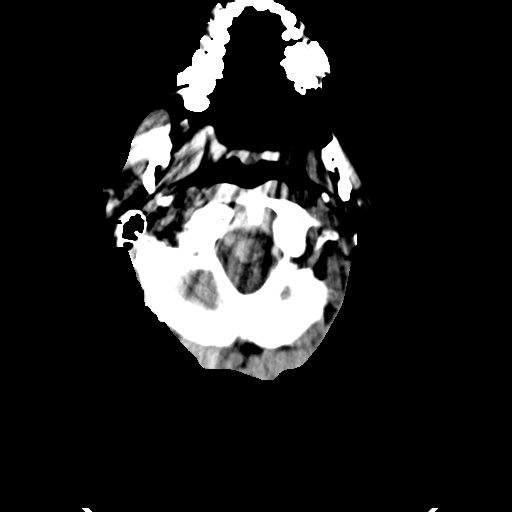
[im 5/37  bone]
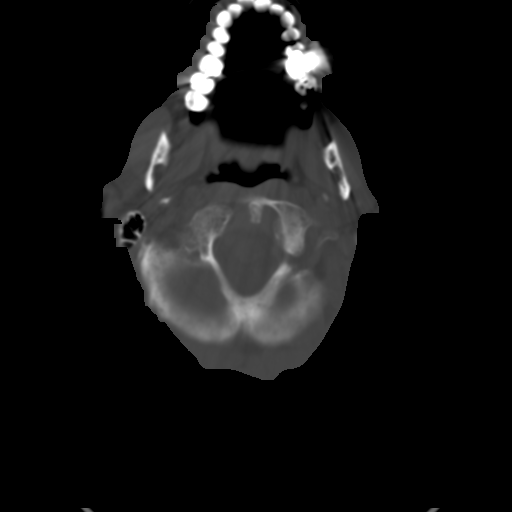
[im 10/37  brain]
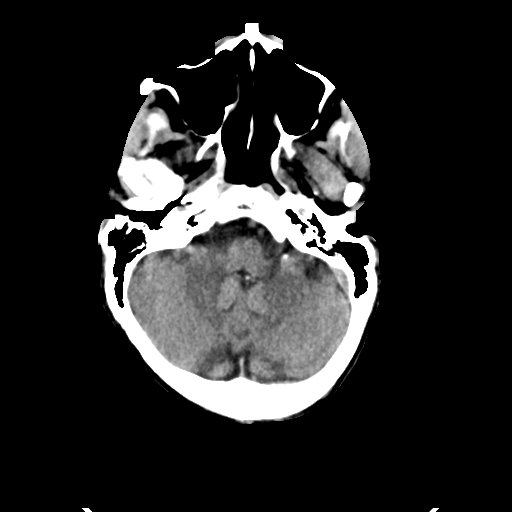
[im 14/37  brain]
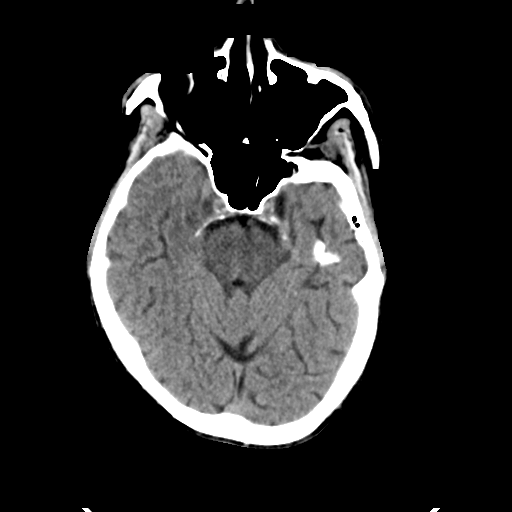
[im 19/37  brain]
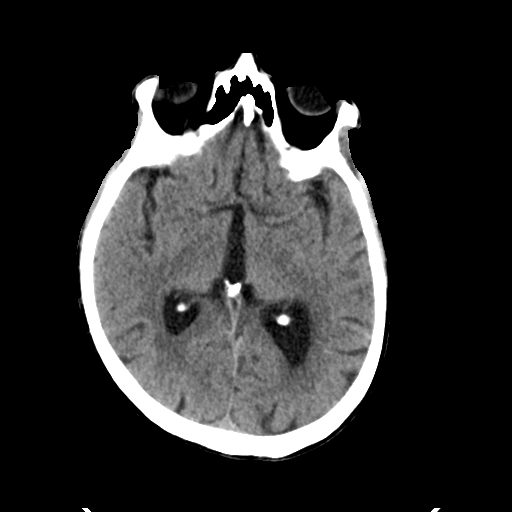
[im 23/37  brain]
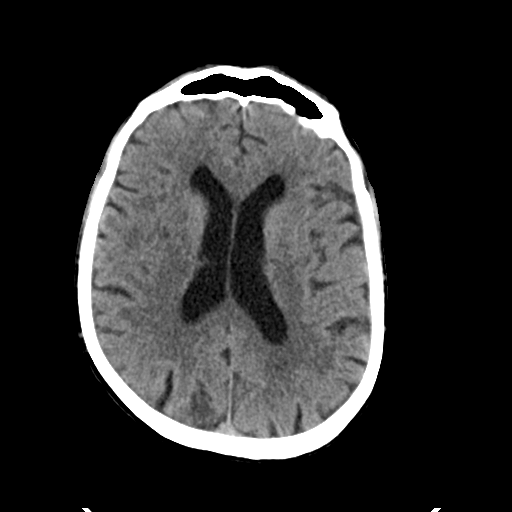
[im 23/37  bone]
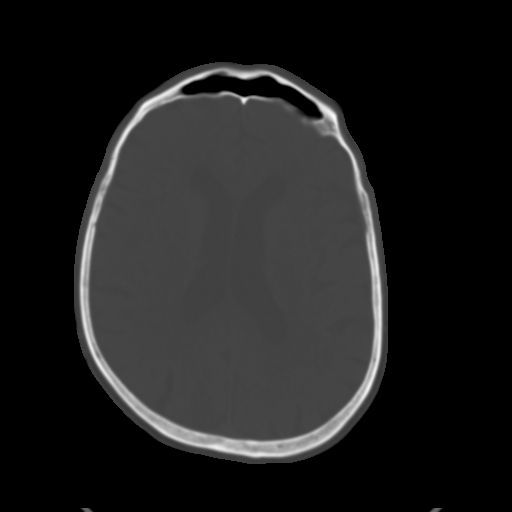
[im 28/37  brain]
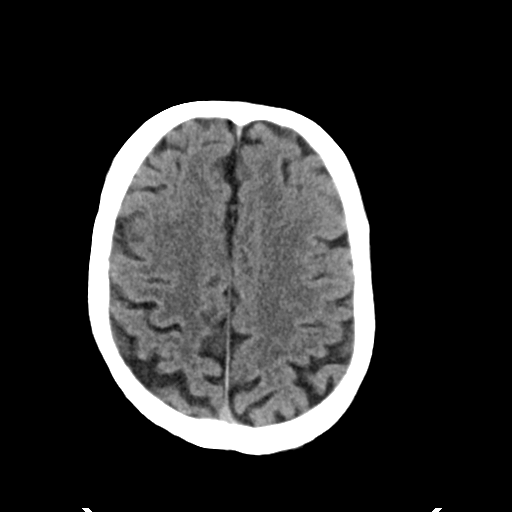
[im 32/37  brain]
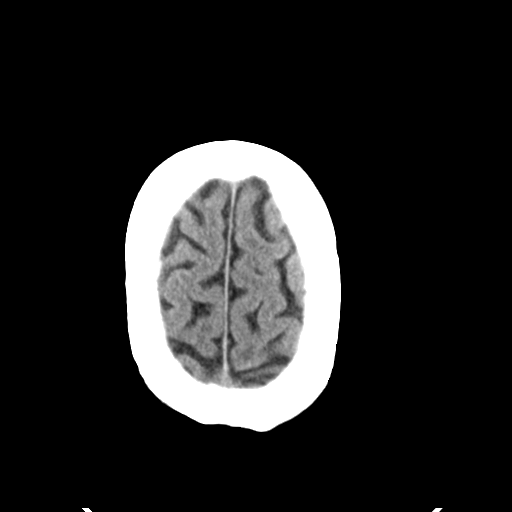

[Series 3: head bone · axial · 0.47mm/px · z∈[+1634,+1696]mm · 4 of 91 slices shown]
[im 10/91  bone]
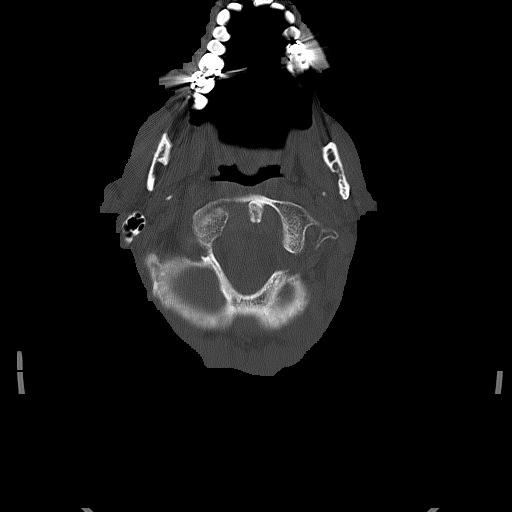
[im 19/91  bone]
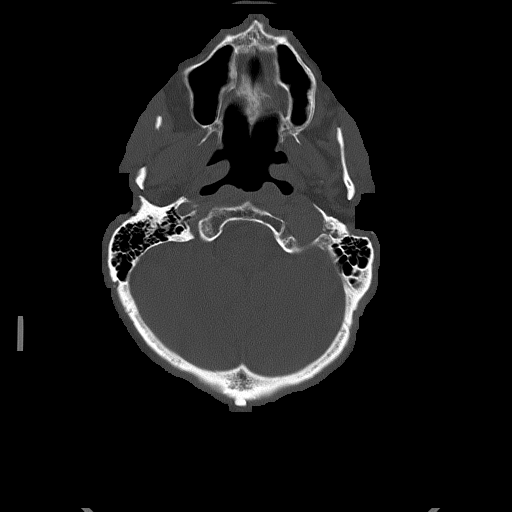
[im 28/91  bone]
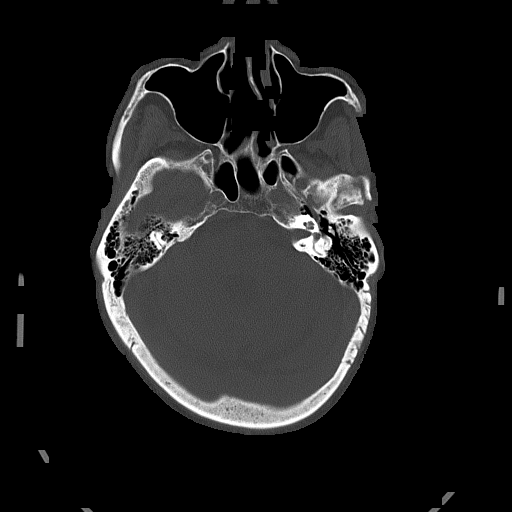
[im 41/91  bone]
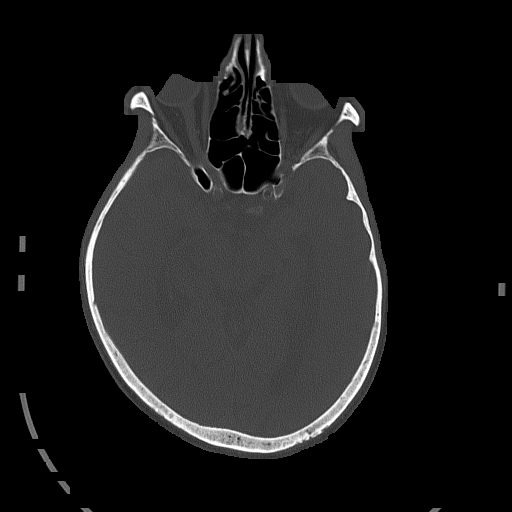

[Series 4: coronal soft tissue · coronal · 0.35mm/px · 3 of 68 slices shown]
[im 23/68  brain]
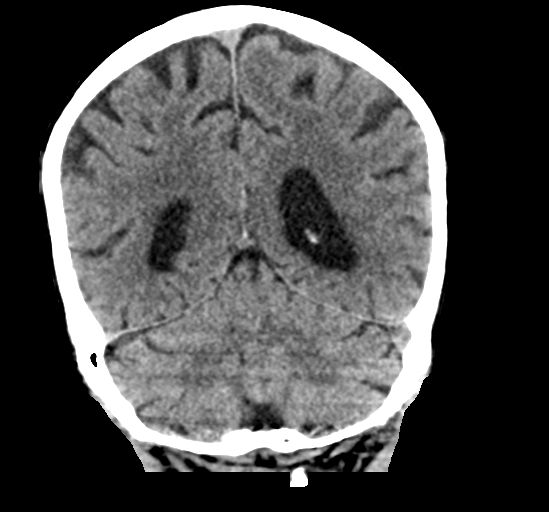
[im 30/68  brain]
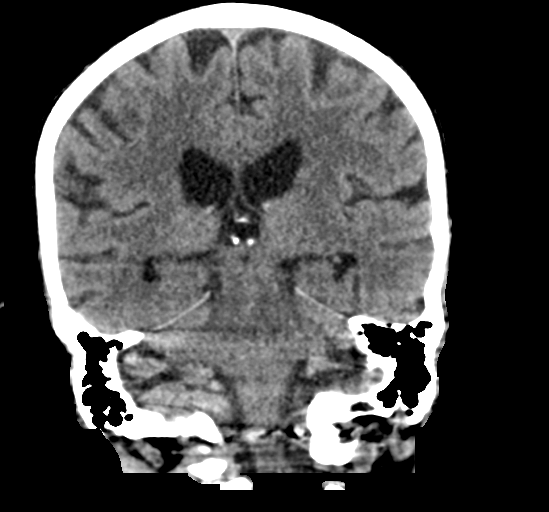
[im 38/68  brain]
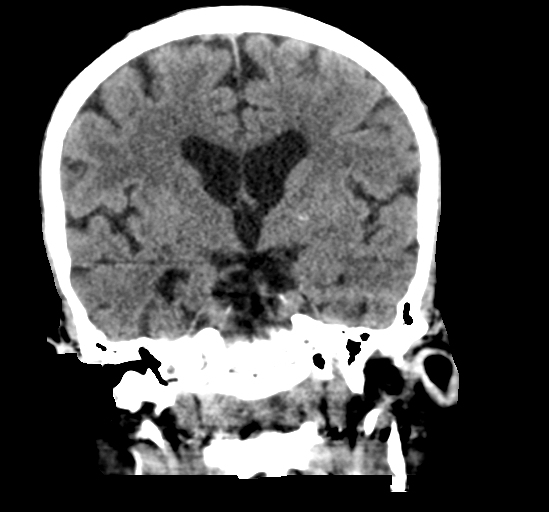

[Series 5: sagittal soft tissue · sagittal · 0.38mm/px · 3 of 52 slices shown]
[im 18/52  brain]
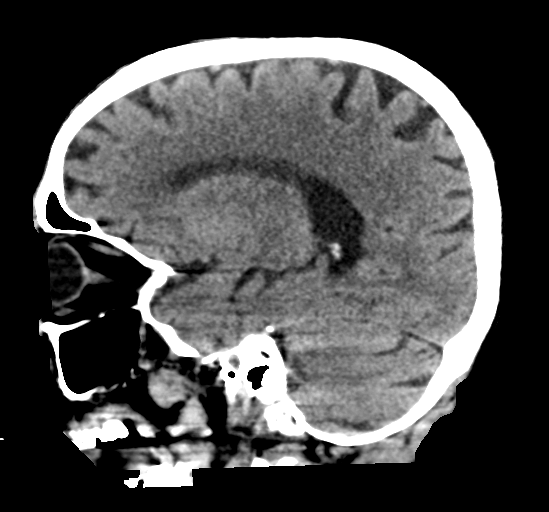
[im 26/52  brain]
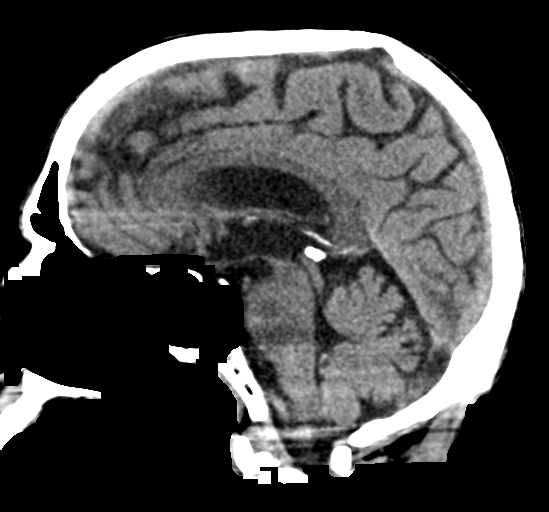
[im 35/52  brain]
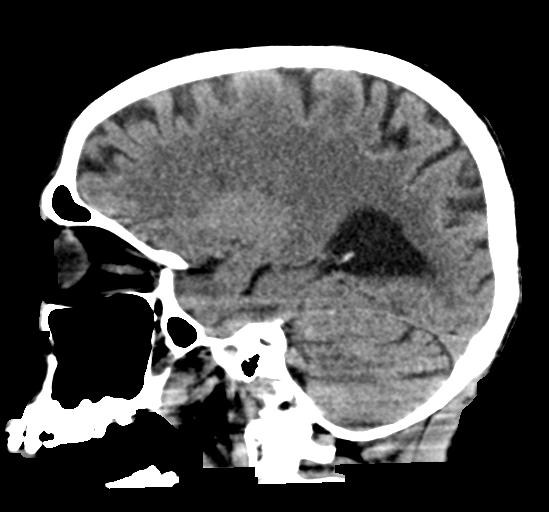

[17 of 47 positions shown; findings below may reference images not displayed]

FINDINGS: Brain:

Mild generalized cerebral and cerebellar atrophy.

Mild patchy and ill-defined hypoattenuation within the cerebral
white matter, nonspecific but compatible with chronic small vessel
ischemic disease.

There is no acute intracranial hemorrhage.

No demarcated cortical infarct.

No extra-axial fluid collection.

No evidence of an intracranial mass.

No midline shift.

Vascular: No hyperdense vessel.  Atherosclerotic calcifications

Skull: Normal. Negative for fracture or focal lesion.

Sinuses/Orbits: Visualized orbits show no acute finding. No
significant paranasal sinus disease at the imaged levels.
IMPRESSION: No evidence of acute intracranial abnormality.

Mild chronic small-vessel ischemic changes within the cerebral white
matter.

Mild generalized parenchymal atrophy.

## 2020-09-21 MED ORDER — WARFARIN SODIUM 4 MG PO TABS
4.0000 mg | ORAL_TABLET | Freq: Once | ORAL | Status: AC
  Administered 2020-09-21: 4 mg via ORAL
  Filled 2020-09-21: qty 1

## 2020-09-21 MED ORDER — SODIUM CHLORIDE 0.9 % IV BOLUS
500.0000 mL | Freq: Once | INTRAVENOUS | Status: AC
  Administered 2020-09-21: 500 mL via INTRAVENOUS

## 2020-09-21 NOTE — Progress Notes (Signed)
Palliative Care Progress Note  Patient seen this evening in follow-up. He is currently having respiratory distress and has been placed on on bipap for worsening hypercarbia, somnolence and periods of SVT. He is able to awaken with vigorous stimulation, but appears to be declining.  Call placed to [name] to discuss his condition and my concern about his risk to be come medically unstable or progress towards cardiac or respiratory arrest with a FULL CODE order on his chart. NOK decision makers would be his daughter and son.   Austin Davis shared with me her concerns for his living conditions and many details about his past history related to anxiety, depression and hoarding type behavior. All of his relationships have historically been strained and difficult. She is certain he would not want to be resuscitated in these circumstances and would never tolerate care in a facility if he declined to the degree that he could not care for himself.  She is unable to make a decision tonight about his code status and wants to contact her brother to help with decision making. We have agreed to meet tomorrow AM to see if we can engage the patient himself in the conversation.  Austin Hacker, DO Palliative Medicine  Time: 35 min Greater than 50%  of this time was spent counseling and coordinating care related to the above assessment and plan.

## 2020-09-21 NOTE — Progress Notes (Signed)
Chaplain engaged in an initial visit with [name] and his two children.  Chaplain learned of Austin Davis' healthcare journey and the ways his children are supporting him now.  His daughter shared how [name] has done some Advanced Care planning to let them know what he desires for his medical care.  It was important, per her, for [name] to have control over that matter and relay to his children his needs.  Son also voiced being concerned about some bruising on his dad's backside.  Chaplain encouraged him to talk with the nurse.  Chaplain and son also talked about the importance of PT and Sully having the ability to move.   Chaplain offered listening, support and presence.    09/21/20 1400  Clinical Encounter Type  Visited With Patient and family together  Visit Type Initial

## 2020-09-21 NOTE — Progress Notes (Signed)
ANTICOAGULATION CONSULT NOTE - Follow Up Consult  Pharmacy Consult for warfarin & heparin  Indication: atrial fibrillation  Allergies  Allergen Reactions   Prednisone Other (See Comments)    Makes skin crawl, rapid HR   Cortisone Palpitations    Patient Measurements: Height: 5\' 7"  (170.2 cm) Weight: 45.4 kg (100 lb) IBW/kg (Calculated) : 66.1  Vital Signs: Temp: 97.7 F (36.5 C) (09/02 0405) Temp Source: Oral (09/02 0405) BP: 100/64 (09/02 0714) Pulse Rate: 90 (09/02 0714)  Labs: Recent Labs    09/19/20 0234 09/19/20 0234 09/20/20 0348 09/20/20 1315 09/20/20 2115 09/21/20 0242  HGB 13.5  --  15.5  --   --  15.2  HCT 42.0  --  46.0  --   --  48.0  PLT 108*  --  146*  --   --  159  LABPROT 17.2*  --  15.0  --   --  17.1*  INR 1.4*  --  1.2  --   --  1.4*  HEPARINUNFRC  --    < > 0.17* 0.46 0.48 0.45  CREATININE 0.52*  --  0.61  --   --  0.80   < > = values in this interval not displayed.     Estimated Creatinine Clearance: 48.1 mL/min (by C-G formula based on SCr of 0.8 mg/dL).   Medications:  Scheduled:   ascorbic acid  500 mg Oral Daily   chlorhexidine  15 mL Mouth Rinse BID   Chlorhexidine Gluconate Cloth  6 each Topical Q0600   cholecalciferol  2,000 Units Oral Daily   dextromethorphan-guaiFENesin  1 tablet Oral BID   feeding supplement  237 mL Oral BID BM   folic acid  1 mg Oral Daily   magic mouthwash  10 mL Oral TID   mouth rinse  15 mL Mouth Rinse q12n4p   mirtazapine  7.5 mg Oral QHS   multivitamin with minerals  1 tablet Oral Daily   omega-3 acid ethyl esters  1 g Oral Daily   Ensure Max Protein  11 oz Oral Daily   Warfarin - Pharmacist Dosing Inpatient   Does not apply q1600   Infusions:   heparin 750 Units/hr (09/21/20 0602)   Home warfarin dosing unclear on med rec.  [facility] was contacted to clarify dosing.  Current regimen is 5mg  daily PLUS 1mg  once weekly. Unable to tell me what day of the week patient is supposed to take  the 1mg , but said it was started on 08/09/20 due to DDI with primidone.  Assessment: 64 yoM admitted on 8/28 with acute hypoxic and hypercarbic respite failure . PMH significant for chronic warfarin anticoagulation, AFib, s/p mitral valve annuloplasty and MAZE 2005.  Admit INR 10.9 and was given Vitamin K 2.5 mg PO. Pharmacy is consulted to resume warfarin dosing on 8/30.  Today, 09/21/2020: INR 1.4 Heparin level 0.45 - therapeutic on IV heparin 750 units/hr CBC:  Hgb remains stable/wnl, Plt low but improved to 159 No bleeding or complications reported by RN.   DDI:  suspect that vitamin K will suppress INR for several days.  Note that patient was NOT taking primidone prior to admission.  Diet:  regular   Goal of Therapy:  Heparin level 0.3-0.7 INR 2-3 Monitor platelets by anticoagulation protocol: Yes   Plan:  Continue heparin IV infusion at 750 units/hr Recheck confirmatory heparin level in 8h Warfarin 4 mg PO x 1. Daily PT/INR, heparin level & CBC   Gretta Arab PharmD, BCPS Clinical  Pharmacist [facility] main pharmacy [phone] 09/21/2020 7:20 AM

## 2020-09-21 NOTE — Progress Notes (Signed)
Patient ID: Austin Davis, male   DOB: [birth year], 80 y.o.   MRN: [id number]  PROGRESS NOTE    Austin Davis  JOA:[id number] DOB: 1940/04/04 DOA: 09/16/2020 PCP: [name], MD   Brief Narrative:  80 y.o. male with medical history significant for paroxysmal atrial fibrillation, mitral valve annuloplasty and maze procedure in 2005 on Coumadin, PAD, history of GI bleed, essential tremor, OSA not able to tolerate CPAP and hyperlipidemia, progressive health decline along with weight loss over the last year presented with increasing shortness of breath.  On presentation, he was tachycardic and slightly hypotensive and hypoxic with oxygen saturations below 90.  On initial ABG, he was acidotic with pH of 7.227 with PCO2 of 90.  He was started on BiPAP.  Chest x-ray was negative for infiltrates.  INR was 10.9 with no overt bleeding.  He was given vitamin K in the ED.  Assessment & Plan:   Acute hypoxic and hypercarbic respite failure  -Patient was hypoxic and hypercapnic on presentation.  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 -Chest x-ray negative for infiltrates.  COVID and influenza testing negative on presentation.  Required BiPAP on presentation. -Pulmonary following: Recommend to continue nocturnal ventilation nightly. -He is being set up with a home trilogy noninvasive ventilator -Echo showed EF of 45 to 50% with grade 1 diastolic dysfunction (unchanged from 2021) hypertension -Repeat CT scan on 8/30 does show small bilateral pleural effusions.  He does have some lower extremity edema.  BNP elevated at 876.  He did receive 1 dose of Lasix on 8/31 with fair urine output.  Clinically does not appear to be volume overloaded at this point -On 9/1, he was noted to be lethargic, ABG repeated and noted to have pH of 7.22 with a PCO2 101 -Placed back on BiPAP -Follow-up PCO2 improved into the 60s and mental status is also improved.  Acute  metabolic encephalopathy -Increasing lethargy today due to hypercapnia -Started back on BiPAP -Mental status has improved with downtrend of PCO2  Hypotension -Normal cortisol level  -He was on midodrine which has since been discontinued. -Blood pressures appear to be in low normal range, although with his BMI of 15, I suspect that his blood pressure normally runs on the lower side.  Failure to thrive/generalized weakness/severe malnutrition -Follow nutrition recommendations.  PT recommends SNF.  TOC consult. -had work up with endocrinology in June 2022 and had PET scan showing no evidence of paraganglioma with physiologic activity within the adrenal glands. Unfortunately no other documentation found in Epic. Recommend f/u with endocrinology outpatient  -Palliative care input appreciated, overall CODE STATUS has been adjusted to partial code.  Further discussion depending on his course. -Discussed in detail with Dr. Hilma Favors and underlying process to explain his general failure to thrive with pulm involvement is not entirely clear -He has undergone CT head today that did not show any significant findings other than mild atrophy and small vessel disease changes -Think it would be helpful if we obtain MRI brain, although I do not feel he would be able to lay flat for an extended period of time for the study.  Supratherapeutic INR/history of mitral annuloplasty Paroxysmal A. fib status post maze procedure -INR 10.9 on presentation.  Status post vitamin K given in the ED.  INR is down to 1.4 today.  Coumadin has been resumed.  He is also on heparin bridge.  Monitor daily INR.  Consult pharmacy. -Echo as  above. -Outpatient follow-up with cardiology.  Documented to have intolerance to beta-blocker in the past.   -On 9/1, patient was having periods of SVT -Overall heart rate has stabilized  Essential tremor -Followed by neurology.  Could not tolerate beta-blockers in the past.  Apparently declined  gabapentin -Continue as needed as needed Xanax.  Outpatient follow-up with PCP/neurology  Right subconjunctival hemorrhage -Continue to monitor  Stage I pressure sacral ulcer: Present on admission -Continue local wound care  DVT prophylaxis: Heparin infusion/Coumadin Code Status: Partial code, no intubation, CPR or defibrillation Family Communication: Discussed with son and daughter at the bedside Disposition Plan: Status is: Inpatient  Remains inpatient appropriate because:Inpatient level of care appropriate due to severity of illness  Dispo: The patient is from: Home              Anticipated d/c is to: Skilled nursing facility              Patient currently is not medically stable to d/c.   Difficult to place patient No   Consultants: PCCM/palliative care  Procedures: Echo  Antimicrobials: None   Subjective: Patient is seen sitting up in bed today.  He is awake and able to participate in history.  Says that he slept well overnight.  Has been wearing BiPAP while sleeping.  Family notes that his p.o. intake is mildly better today.  He has been drinking Ensure.  Objective: Vitals:   09/21/20 1410 09/21/20 1439 09/21/20 1500 09/21/20 1620  BP: 103/67  96/73   Pulse: (!) 102  (!) 114   Resp: (!) 31  (!) 25   Temp:    99.2 F (37.3 C)  TempSrc:    Axillary  SpO2: 98%  100%   Weight:  46.3 kg    Height:        Intake/Output Summary (Last 24 hours) at 09/21/2020 1726 Last data filed at 09/21/2020 1600 Gross per 24 hour  Intake 1048.53 ml  Output 500 ml  Net 548.53 ml   Filed Weights   09/16/20 1450 09/21/20 1439  Weight: 45.4 kg 46.3 kg    Examination:  General exam: Somnolent, cachectic Respiratory system: Clear to auscultation. Respiratory effort normal. Cardiovascular system:RRR. No murmurs, rubs, gallops. Gastrointestinal system: Abdomen is nondistended, soft and nontender. No organomegaly or masses felt. Normal bowel sounds heard. Central nervous  system:somnolent, noted to have tremor in his jaw as well as his right hand. Extremities: Trace edema lower extremities bilaterally Skin: No rashes, lesions or ulcers Psychiatry: somnolent   Data Reviewed: I have personally reviewed following labs and imaging studies  CBC: Recent Labs  Lab 09/16/20 1512 09/17/20 0439 09/18/20 0235 09/19/20 0234 09/20/20 0348 09/21/20 0242  WBC 8.5 7.3 7.9 8.2 9.6 7.6  NEUTROABS 7.0  --  6.3 6.7  --   --   HGB 17.8* 13.5 12.9* 13.5 15.5 15.2  HCT 55.1* 40.5 41.1 42.0 46.0 48.0  MCV 98.2 97.4 101.7* 99.3 95.8 101.5*  PLT 166 104* 118* 108* 146* 481   Basic Metabolic Panel: Recent Labs  Lab 09/17/20 0439 09/17/20 1913 09/18/20 0235 09/19/20 0234 09/20/20 0348 09/21/20 0242  NA 138  --  140 139 137 141  K 4.5  --  4.3 4.1 4.0 3.8  CL 99  --  100 97* 94* 92*  CO2 27  --  36* 34* 32 42*  GLUCOSE 80  --  112* 111* 126* 127*  BUN 31*  --  25* 21 16 30*  CREATININE 0.81  --  0.55* 0.52* 0.61 0.80  CALCIUM 8.5*  --  8.6* 8.7* 9.0 9.1  MG  --   --  2.3  --  2.0  --   PHOS  --  2.7  --   --  2.2*  --    GFR: Estimated Creatinine Clearance: 49 mL/min (by C-G formula based on SCr of 0.8 mg/dL). Liver Function Tests: Recent Labs  Lab 09/16/20 1512 09/18/20 0235 09/20/20 0348  AST 34 25 23  ALT 43 35 31  ALKPHOS 46 37* 43  BILITOT 1.4* 0.9 1.5*  PROT 6.7 4.9* 5.6*  ALBUMIN 4.4 3.2* 3.5   Recent Labs  Lab 09/16/20 1513  LIPASE 36   No results for input(s): AMMONIA in the last 168 hours. Coagulation Profile: Recent Labs  Lab 09/17/20 0439 09/18/20 0235 09/19/20 0234 09/20/20 0348 09/21/20 0242  INR 6.4* 2.3* 1.4* 1.2 1.4*   Cardiac Enzymes: No results for input(s): CKTOTAL, CKMB, CKMBINDEX, TROPONINI in the last 168 hours. BNP (last 3 results) No results for input(s): PROBNP in the last 8760 hours. HbA1C: No results for input(s): HGBA1C in the last 72 hours. CBG: No results for input(s): GLUCAP in the last 168  hours. Lipid Profile: No results for input(s): CHOL, HDL, LDLCALC, TRIG, CHOLHDL, LDLDIRECT in the last 72 hours. Thyroid Function Tests: No results for input(s): TSH, T4TOTAL, FREET4, T3FREE, THYROIDAB in the last 72 hours.  Anemia Panel: No results for input(s): VITAMINB12, FOLATE, FERRITIN, TIBC, IRON, RETICCTPCT in the last 72 hours.  Sepsis Labs: No results for input(s): PROCALCITON, LATICACIDVEN in the last 168 hours.  Recent Results (from the past 240 hour(s))  Resp Panel by RT-PCR (Flu A&B, Covid) Nasopharyngeal Swab     Status: None   Collection Time: 09/16/20  3:22 PM   Specimen: Nasopharyngeal Swab; Nasopharyngeal(NP) swabs in vial transport medium  Result Value Ref Range Status   SARS Coronavirus 2 by RT PCR NEGATIVE NEGATIVE Final    Comment: (NOTE) SARS-CoV-2 target nucleic acids are NOT DETECTED.  The SARS-CoV-2 RNA is generally detectable in upper respiratory specimens during the acute phase of infection. The lowest concentration of SARS-CoV-2 viral copies this assay can detect is 138 copies/mL. A negative result does not preclude SARS-Cov-2 infection and should not be used as the sole basis for treatment or other patient management decisions. A negative result may occur with  improper specimen collection/handling, submission of specimen other than nasopharyngeal swab, presence of viral mutation(s) within the areas targeted by this assay, and inadequate number of viral copies(<138 copies/mL). A negative result must be combined with clinical observations, patient history, and epidemiological information. The expected result is Negative.  Fact Sheet for Patients:  EntrepreneurPulse.com.au  Fact Sheet for Healthcare Providers:  IncredibleEmployment.be  This test is no t yet approved or cleared by the [place] FDA and  has been authorized for detection and/or diagnosis of SARS-CoV-2 by FDA under an Emergency Use  Authorization (EUA). This EUA will remain  in effect (meaning this test can be used) for the duration of the COVID-19 declaration under Section 564(b)(1) of the Act, 21 U.S.C.section 360bbb-3(b)(1), unless the authorization is terminated  or revoked sooner.       Influenza A by PCR NEGATIVE NEGATIVE Final   Influenza B by PCR NEGATIVE NEGATIVE Final    Comment: (NOTE) The Xpert Xpress SARS-CoV-2/FLU/RSV plus assay is intended as an aid in the diagnosis of influenza from Nasopharyngeal swab specimens and should not be used as a sole basis for treatment. Nasal  washings and aspirates are unacceptable for Xpert Xpress SARS-CoV-2/FLU/RSV testing.  Fact Sheet for Patients: EntrepreneurPulse.com.au  Fact Sheet for Healthcare Providers: IncredibleEmployment.be  This test is not yet approved or cleared by the [place] FDA and has been authorized for detection and/or diagnosis of SARS-CoV-2 by FDA under an Emergency Use Authorization (EUA). This EUA will remain in effect (meaning this test can be used) for the duration of the COVID-19 declaration under Section 564(b)(1) of the Act, 21 U.S.C. section 360bbb-3(b)(1), unless the authorization is terminated or revoked.  Performed at [facility], [place] [address], [place], [place] [address]   MRSA Next Gen by PCR, Nasal     Status: None   Collection Time: 09/16/20  8:23 PM   Specimen: Nasal Mucosa; Nasal Swab  Result Value Ref Range Status   MRSA by PCR Next Gen NOT DETECTED NOT DETECTED Final    Comment: (NOTE) The GeneXpert MRSA Assay (FDA approved for NASAL specimens only), is one component of a comprehensive MRSA colonization surveillance program. It is not intended to diagnose MRSA infection nor to guide or monitor treatment for MRSA infections. Test performance is not FDA approved in patients less than 27 years old. Performed at [facility], [place]  [address], [place], [place] [address]          Radiology Studies: CT HEAD WO CONTRAST (5MM)  Result Date: 09/21/2020 CLINICAL DATA:  Vertigo, central. EXAM: CT HEAD WITHOUT CONTRAST TECHNIQUE: Contiguous axial images were obtained from the base of the skull through the vertex without intravenous contrast. COMPARISON:  None. FINDINGS: Brain: Mild generalized cerebral and cerebellar atrophy. Mild patchy and ill-defined hypoattenuation within the cerebral white matter, nonspecific but compatible with chronic small vessel ischemic disease. There is no acute intracranial hemorrhage. No demarcated cortical infarct. No extra-axial fluid collection. No evidence of an intracranial mass. No midline shift. Vascular: No hyperdense vessel.  Atherosclerotic calcifications Skull: Normal. Negative for fracture or focal lesion. Sinuses/Orbits: Visualized orbits show no acute finding. No significant paranasal sinus disease at the imaged levels. IMPRESSION: No evidence of acute intracranial abnormality. Mild chronic small-vessel ischemic changes within the cerebral white matter. Mild generalized parenchymal atrophy. Electronically Signed   By: Kellie Simmering D.O.   On: 09/21/2020 14:09        Scheduled Meds:  ascorbic acid  500 mg Oral Daily   chlorhexidine  15 mL Mouth Rinse BID   Chlorhexidine Gluconate Cloth  6 each Topical Q0600   cholecalciferol  2,000 Units Oral Daily   dextromethorphan-guaiFENesin  1 tablet Oral BID   feeding supplement  237 mL Oral BID BM   folic acid  1 mg Oral Daily   magic mouthwash  10 mL Oral TID   mouth rinse  15 mL Mouth Rinse q12n4p   mirtazapine  7.5 mg Oral QHS   multivitamin with minerals  1 tablet Oral Daily   omega-3 acid ethyl esters  1 g Oral Daily   Ensure Max Protein  11 oz Oral Daily   Warfarin - Pharmacist Dosing Inpatient   Does not apply q1600   Continuous Infusions:  heparin 750 Units/hr (09/21/20 1600)          Kathie Dike, MD [facility] 09/21/2020, 5:26 PM

## 2020-09-21 NOTE — Progress Notes (Addendum)
Palliative Care Progress Note  Met with patient, his daughter and son this morning to discuss goals of care. He tolerated bipap last PM and took mirtazapine-looks better this AM, mental status is clear, he ate 50% of his breakfast which is improvement.  He complains of "vertigo", distortions in his vision for last few days. Feel very weak like he cannot get air out or in-worse as day progresses. Suspect neuromuscular involvement. Tremor is better this AM-and also seems to get worse as the day progresses.  Completed a MOST form. Now LIMITED CODE. Meds and Bipap only. Extensive discussion at bedside.  Austin Hacker, DO Palliative Medicine

## 2020-09-21 NOTE — Progress Notes (Signed)
Nutrition Follow-up  DOCUMENTATION CODES:   Severe malnutrition in context of chronic illness, Underweight  INTERVENTION:  - continue Ensure Plus BID.  - weigh patient today.   NUTRITION DIAGNOSIS:   Severe Malnutrition related to chronic illness as evidenced by percent weight loss, energy intake < or equal to 75% for > or equal to 1 month, severe fat depletion, severe muscle depletion. -ongoing  GOAL:   Patient will meet greater than or equal to 90% of their needs -unmet  MONITOR:   PO intake, Supplement acceptance, Labs, Weight trends, I & O's  ASSESSMENT:   80 year old man who presented to [facility] ED 8/29 with SOB, dyspnea and fatigue/malaise, worsening over the last several weeks prior to admission. PMHx significant for HLD, PAF, MVP s/p MV annuloplasty (2005, on Coumadin), PAD, CAD, OSA (on CPAP, noncompliant), history of GIB (2019).     On admission, patient reported general decline of his wellness over the past 1-2 years (starting with COVID) with significant weight loss 2/2 poor appetite and loss of mobility (previously ambulating independently, now requiring assistance).  Patient briefly discussed in rounds this AM. He is now out of the room to CT. Documented meal intake percentages since previous RD assessment on 8/29 were 10% of breakfast yesterday and 50% of breakfast today.  He is ordered Ensure Plus BID (350 kcal and 13 grams protein/bottle) and Ensure Max once/day (150 kcal and 30 grams protein/bottle) and he has been accepting both supplements 100% of the time offered, per review of orders.  He has not been weighed since admission on 8/28 and weight on that date appears to have been a stated weight. No information documented in the edema section of flow sheet this admission.   Palliative Care met with family at bedside late this AM. Patient is a limited code (medications and BiPAP only).  Per notes: - plan for/hope for d/c home with Trelegy   Labs reviewed; Cl: 92  mmol/l, BUN: 30 mg/dl,  Medications reviewed; 500 mg ascorbic acid/day, 2000 units cholecalciferol/day, 1 mg folvite/day, 1 tablet multivitamin with minerals/day, 1 g lovaza/day.    Diet Order:   Diet Order             Diet regular Room service appropriate? Yes; Fluid consistency: Thin  Diet effective now                   EDUCATION NEEDS:   Education needs have been addressed  Skin:  Skin Assessment: Skin Integrity Issues: Skin Integrity Issues:: Stage I Stage I: medial coccyx  Last BM:  9/1 (type 5 x1 and type 6 x1)  Height:   Ht Readings from Last 1 Encounters:  09/16/20 5' 7"  (1.702 m)    Weight:   Wt Readings from Last 1 Encounters:  09/16/20 45.4 kg     Estimated Nutritional Needs:  Kcal:  2000-2200 Protein:  90-105g Fluid:  2L/day      Jarome Matin, MS, RD, LDN, CNSC Inpatient Clinical Dietitian RD pager # available in [place]  After hours/weekend pager # available in [facility]

## 2020-09-21 NOTE — Progress Notes (Addendum)
NAME:  Austin Davis, MRN:  [id number], DOB:  [birth year], LOS: 5 ADMISSION DATE:  09/16/2020 CONSULTATION DATE:  09/17/2020 REFERRING MD:  Starla Link - [facility] CHIEF COMPLAINT:  Hypercarbic respiratory failure   History of Present Illness:  [name] is seen in consultation at the request of Dr. Starla Link for recommendations on further evaluation and management of hypercarbic respiratory failure.  Patient is a 80 year old man who presented to [facility] ED 8/29 with SOB, dyspnea and fatigue/malaise, worsening over the last several weeks prior to admission. PMHx significant for HLD, PAF, MVP s/p MV annuloplasty (2005, on Coumadin), PAD, CAD, OSA (on CPAP, noncompliant), history of GIB (2019).  On admission, patient reported general decline of his wellness over the past 1-2 years.  He tells me that he locked down for COVID and noted some weight gain from lack of exercise.  He started trying to eat a healthier diet and initially noted about 10 pounds of weight loss, but then noted he could no longer complete tasks he used to do with ease such as mowing the lawn without a significant shortness of breath and then continued to lose weight without effort (approximately 75lb) with progressive generalized weakness, malaise, lack of appetite, noting that his favorite foods taste strange or do not taste appetizing anymore with exertional shortness of breath.  He noted a very bad respiratory illness early 2019 that he never had a good etiology for.  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   He was diagnosed with OSA 10/2019 and CPAP was ordered but not delivered until 07/2020 due to supply issues; he reports significant issues with mask fit/discomfort and associated claustrophobia, especially while lying flat. As a result,  he has only attempted to wear his mask a handful of times since receiving his machine.  Pertinent Medical History:   Past Medical History:  Diagnosis Date   CHF (congestive heart failure) ([place])    Coronary artery disease    GI bleed 2019   hospitalized at [facility] for one week   Headache    since childhood   Low blood sugar    since childhood, controlled by diet   Mitral regurgitation    Mitral valve prolapse    Osteopenia 2021   Paroxysmal atrial fibrillation ([place])    Significant Hospital Events: Including procedures, antibiotic start and stop dates in addition to other pertinent events   8/28 - Presented to [facility] ED for SOB, dyspnea. Admitted for hypercarbic RF. 8/29 - PCCM consulted for hypercarbic RF requiring BiPAP. Nutrition consult. 8/31 - On room air, tolerating bipap intermittently at night, CT chest without significant findings 9/01 - Refused CPAP overnight, tolerating this morning.Ongoing anxiety/claustrophobia with mask.  9/02 - Worked with PT, recommendation SNF  Interim History / Subjective:  Brief episodes of SVT yesterday to 150s Given Lopressor, became very fatigued/lethargic History of BB intolerance - transitioned to Diltiazem PCO2 101 on ABG, improved to 61 with BiPAP Tolerated BiPAP overnight  Objective:  Blood pressure 100/64, pulse 90, temperature (!) 97.4 F (36.3 C), temperature source Axillary, resp. rate 16, height 5\' 7"  (1.702 m), weight 45.4 kg, SpO2 97 %.    FiO2 (%):  [30 %] 30 %   Intake/Output Summary (Last 24 hours) at 09/21/2020 0855 Last data filed at 09/21/2020 0600 Gross  per 24 hour  Intake 388.54 ml  Output 520 ml  Net -131.46 ml    Filed Weights   09/16/20 1450  Weight: 45.4 kg   Physical Examination: General: Chronically ill-appearing elderly man in NAD. Tremulous. Cachectic. HEENT: [place]/AT, anicteric sclera, R subcojunctival hemorrhage (improving), PERRL, dry mucous membranes. Temporal/facial wasting noted. Neuro:  Lethargic. Responds to verbal stimuli. Following commands consistently. Moves all 4 extremities spontaneously. Strength 3/5 in all 4 extremities. CV: RRR, no m/g/r. PULM: Breathing even and unlabored on BiPAP. Lung fields diminished bilaterally. GI: Soft, nontender, nondistended. Normoactive bowel sounds. Extremities: No LE edema noted. Gross muscle wasting. Skin: Warm/dry, venous stasis changes to BLE.  Resolved Hospital Problem List:    Assessment & Plan:   80 year old man who presented to [facility] ED 8/29 with SOB, dyspnea and fatigue/malaise, worsening over the last year and acutely several weeks prior to admission. PMHx significant for HLD, PAF, MVP s/p MV annuloplasty (2005, on Coumadin), PAD, CAD, OSA (on CPAP, noncompliant), history of GIB (2019).  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  His profound weight loss and progressive exertional dyspnea is concerning. Home sleep study showed 200 episodes of apnea8/30 CT Chest without evidence of lung disease, shows mild scarring and pleural effusions. Etiology of profound weight loss, hypoventilation and generalized weakness is not completely clear; mentions depression during COVID lockdown and seems to be barely eating.  Consider neuromuscular disease - has seen neurology and was diagnosed with essential tremor, no bulbar symptoms or upper motor neuron symptoms to suggest ALS. 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  Previous ABGs have documented high pCO2.  Patient would benefit from non-invasive ventilation.  Without this therapy, the patient is at high risk of ending up with worsening symptoms, worsened respiratory failure, need for ER visits and/or recurrent hospitalizations.  Bilevel device is unable to adequately support patient's nocturnal ventilation needs.  Patient would benefit from NIV therapy with set tidal volumes and pressure.  Trilogy Parameters:  [X]  VAPS VT: 6cc/kg IBW, RR 8-15 BPM, IPAP Min 8-12, IPAP Max 12-32, EPAP 4-12, AVAPS Rate 3-5  [X]  AVAPS AE VT: 6-8 cc/kg IBW, RR 8-15 BPM, PS Min 4-10, PS Max 8-20, EPAP Min 4-12, EPAP Max 12-20, Max Pressure 32, AVAPS Rate 3-5  [X]  Dual Setting for Exertion or Respiratory Distress VT: 10cc/kg IBW, RR 14-16, AVAPS Rate 5  [X]  Set Volume, Pressure and Rate Alarms per patient condition [X]  Fit and modify mask as needed  Best Practice: (right click and "Reselect all SmartList Selections" daily)   Per Primary Team   Critical care time: N/A   Rhae Lerner [facility] 09/21/20 8:55 AM  Please see [email or web address] for pager details.  From 7A-7P if no response, please call [phone] After hours, please call ELink [phone]

## 2020-09-21 NOTE — Progress Notes (Signed)
Occupational Therapy Treatment Patient Details Name: Austin Davis MRN: [id number] DOB: [birth year] Today's Date: 09/21/2020    History of present illness 80 year old male never smoker with OSA non-adherent to therapy, hx mitral valve repair,chronic combined heart failure, atrial fibrillation, tremors, and general weakness who presents with acute hypercarbic respiratory failure requiring BiPAP. CT Chest with no evidence of infection or other causes to contribute to his dyspnea. The etiology of his chronic respiratory failure is unclear as he does not have evidence of emphysema or stigmata of OHS. Also question his diagnosis of OSA as his sleep study commented on >200 apnea and hypopneas however this was a home sleep study so unable to confirm if true obstructive apneas.  He does have significant failure to thrive and general weakness that is concerning for hypoventilation secondary to severe deconditioning/neuro?muscular weakness.   OT comments  Treatment focused on functional mobility to work toward advancing ADLs to out of bed and improving activity tolerance. Patient able to walk 4 feet then 10 feet with RW and min guard. Patient limited by fatigue. Recommend short term rehab at discharge.    Follow Up Recommendations  SNF    Equipment Recommendations  None recommended by OT    Recommendations for Other Services      Precautions / Restrictions Precautions Precautions: Fall Precaution Comments: monitor sats Restrictions Weight Bearing Restrictions: No       Mobility Bed Mobility Overal bed mobility: Needs Assistance       Supine to sit: HOB elevated;Max assist     General bed mobility comments: max assist for assistane with LEs and trunk negotiation to transfer to side of bed. +2 for lines/lead management.    Transfers Overall transfer level: Needs assistance Equipment used: Rolling walker (2 wheeled) Transfers: Sit to/from Stand Sit to Stand: +2 safety/equipment;Min  assist         General transfer comment: Min assist to stand with RW from bed. Patient able to walk 4 feet forward with min guard, +2 to manage for lines. Patient requested sitting break. Patient able to stand and walk another 10 ft with min guard. vital signs stable.    Balance Overall balance assessment: Needs assistance Sitting-balance support: No upper extremity supported Sitting balance-Leahy Scale: Fair Sitting balance - Comments: patient propping due to fatigue not poor balance   Standing balance support: During functional activity Standing balance-Leahy Scale: Fair Standing balance comment: able to bring hands off walker briefly                           ADL either performed or assessed with clinical judgement   ADL Overall ADL's : Needs assistance/impaired     Grooming: Set up;Bed level Grooming Details (indicate cue type and reason): washed face in bed to improve alertness.                                     Vision   Vision Assessment?: No apparent visual deficits   Perception     Praxis      Cognition Arousal/Alertness: Awake/alert Behavior During Therapy: WFL for tasks assessed/performed Overall Cognitive Status: Within Functional Limits for tasks assessed  Exercises     Shoulder Instructions       General Comments      Pertinent Vitals/ Pain       Pain Assessment: No/denies pain  Home Living                                          Prior Functioning/Environment              Frequency  Min 2X/week        Progress Toward Goals  OT Goals(current goals can now be found in the care plan section)  Progress towards OT goals: Progressing toward goals  Acute Rehab OT Goals Patient Stated Goal: to get better OT Goal Formulation: With patient Time For Goal Achievement: 10/03/20 Potential to Achieve Goals: Good  Plan Discharge plan  needs to be updated    Co-evaluation                 AM-PAC OT "6 Clicks" Daily Activity     Outcome Measure   Help from another person eating meals?: A Little Help from another person taking care of personal grooming?: A Little Help from another person toileting, which includes using toliet, bedpan, or urinal?: Total Help from another person bathing (including washing, rinsing, drying)?: A Lot Help from another person to put on and taking off regular upper body clothing?: A Lot Help from another person to put on and taking off regular lower body clothing?: A Lot 6 Click Score: 13    End of Session Equipment Utilized During Treatment: Rolling walker;Oxygen  OT Visit Diagnosis: Muscle weakness (generalized) (M62.81);Unsteadiness on feet (R26.81)   Activity Tolerance Patient limited by fatigue   Patient Left with call bell/phone within reach;in chair;with chair alarm set;with family/visitor present   Nurse Communication Mobility status        Time: 1400-1435 OT Time Calculation (min): 35 min  Charges: OT General Charges $OT Visit: 1 Visit OT Treatments $Therapeutic Activity: 23-37 mins  Dois Juarbe, OTR/L [place]  Office [phone] Pager: [place] 09/21/2020, 2:53 PM

## 2020-09-22 ENCOUNTER — Inpatient Hospital Stay (HOSPITAL_COMMUNITY): Payer: Medicare Other

## 2020-09-22 DIAGNOSIS — R627 Adult failure to thrive: Secondary | ICD-10-CM | POA: Diagnosis not present

## 2020-09-22 DIAGNOSIS — G250 Essential tremor: Secondary | ICD-10-CM | POA: Diagnosis not present

## 2020-09-22 DIAGNOSIS — E43 Unspecified severe protein-calorie malnutrition: Secondary | ICD-10-CM | POA: Diagnosis not present

## 2020-09-22 DIAGNOSIS — J9621 Acute and chronic respiratory failure with hypoxia: Secondary | ICD-10-CM | POA: Diagnosis not present

## 2020-09-22 LAB — PROTIME-INR
INR: 2 — ABNORMAL HIGH (ref 0.8–1.2)
Prothrombin Time: 22.6 seconds — ABNORMAL HIGH (ref 11.4–15.2)

## 2020-09-22 LAB — CBC
HCT: 39.3 % (ref 39.0–52.0)
Hemoglobin: 12.6 g/dL — ABNORMAL LOW (ref 13.0–17.0)
MCH: 32 pg (ref 26.0–34.0)
MCHC: 32.1 g/dL (ref 30.0–36.0)
MCV: 99.7 fL (ref 80.0–100.0)
Platelets: 143 10*3/uL — ABNORMAL LOW (ref 150–400)
RBC: 3.94 MIL/uL — ABNORMAL LOW (ref 4.22–5.81)
RDW: 14.1 % (ref 11.5–15.5)
WBC: 5.4 10*3/uL (ref 4.0–10.5)
nRBC: 0 % (ref 0.0–0.2)

## 2020-09-22 LAB — CK: Total CK: 26 U/L — ABNORMAL LOW (ref 49–397)

## 2020-09-22 LAB — HEPARIN LEVEL (UNFRACTIONATED): Heparin Unfractionated: 0.3 IU/mL (ref 0.30–0.70)

## 2020-09-22 IMAGING — CT CT ABD-PELV W/ CM
2 of 5 series · 15 of 46 positions shown, 17 images · IV contrast (OMNIPAQUE)
Comparison: [DATE]

CLINICAL DATA: Unintentional weight loss, unable to eat or drink

EXAM:
CT ABDOMEN AND PELVIS WITH CONTRAST
TECHNIQUE: Multidetector CT imaging of the abdomen and pelvis was performed
using the standard protocol following bolus administration of
intravenous contrast.
CONTRAST:  65mL OMNIPAQUE IOHEXOL 350 MG/ML SOLN

[Series 2: axial st · axial · 0.74mm/px · z∈[+1073,+1448]mm · 12 of 87 slices shown, 14 images]
[im 6/87  soft-tissue]
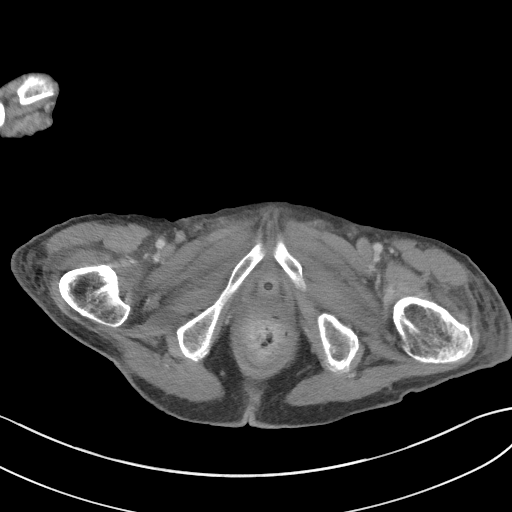
[im 6/87  bone]
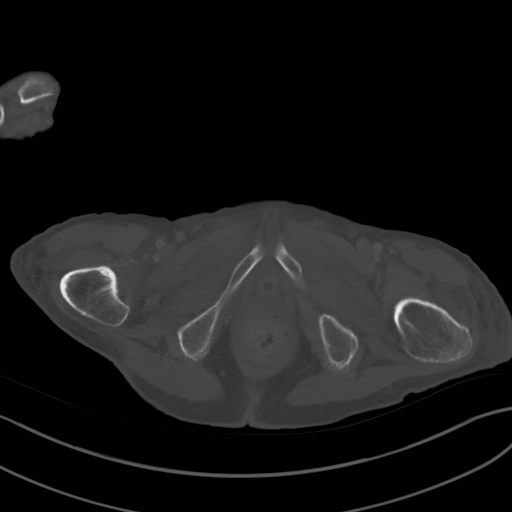
[im 16/87  soft-tissue]
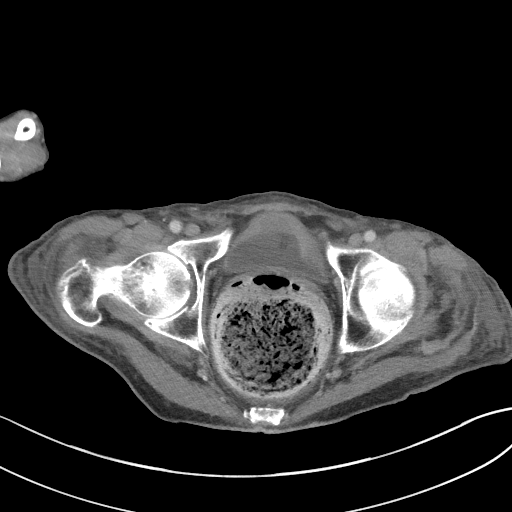
[im 21/87  soft-tissue]
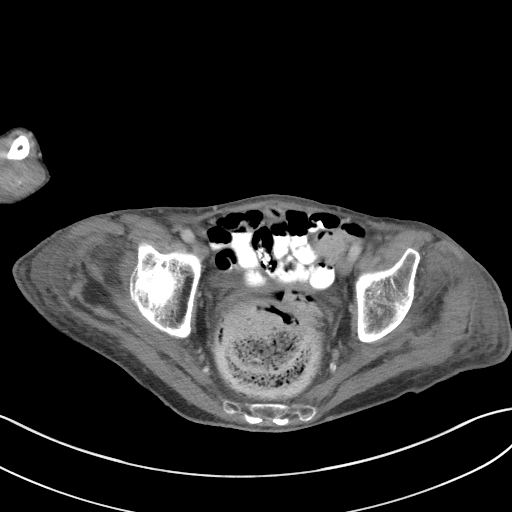
[im 26/87  soft-tissue]
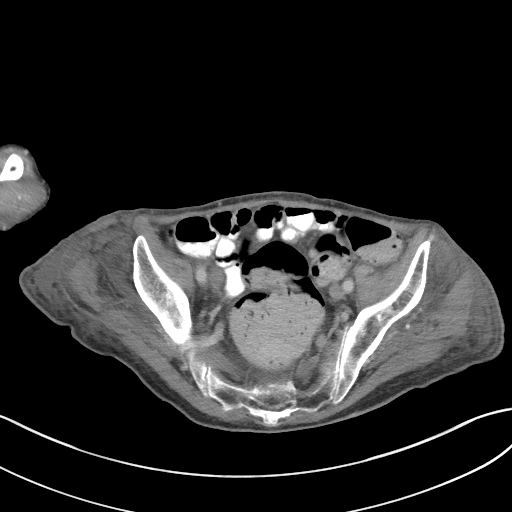
[im 36/87  soft-tissue]
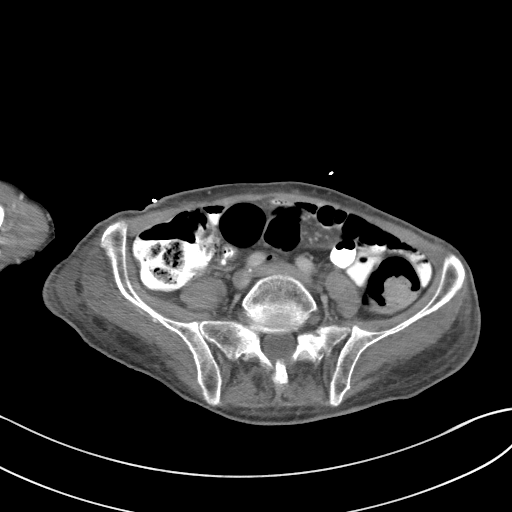
[im 41/87  soft-tissue]
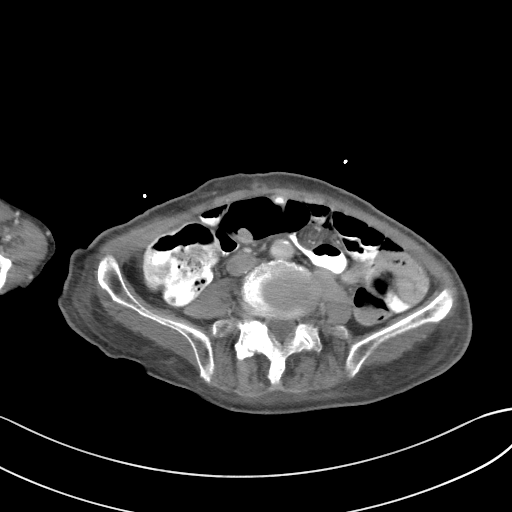
[im 46/87  soft-tissue]
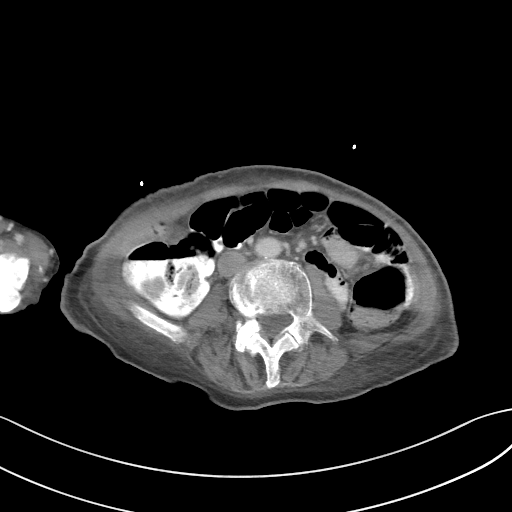
[im 56/87  soft-tissue]
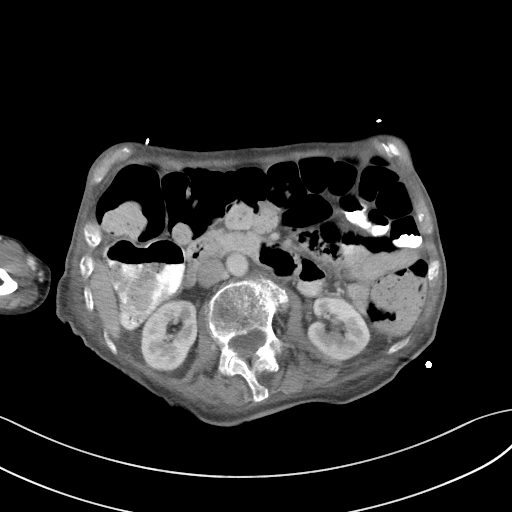
[im 61/87  soft-tissue]
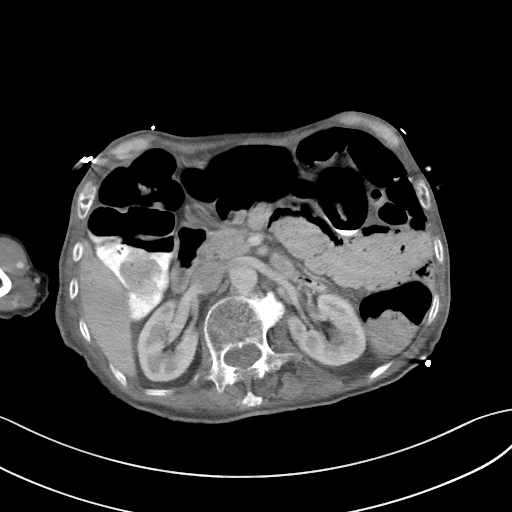
[im 61/87  bone]
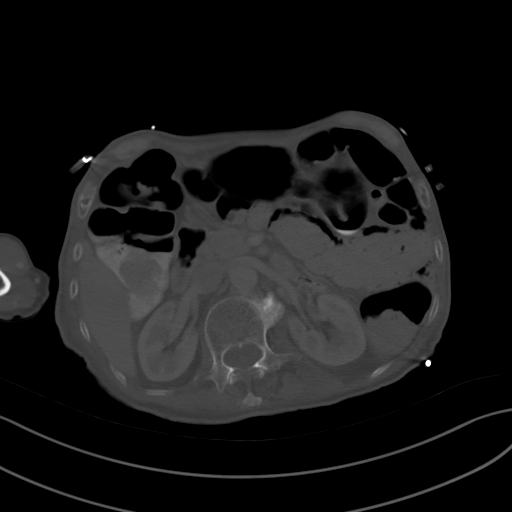
[im 66/87  soft-tissue]
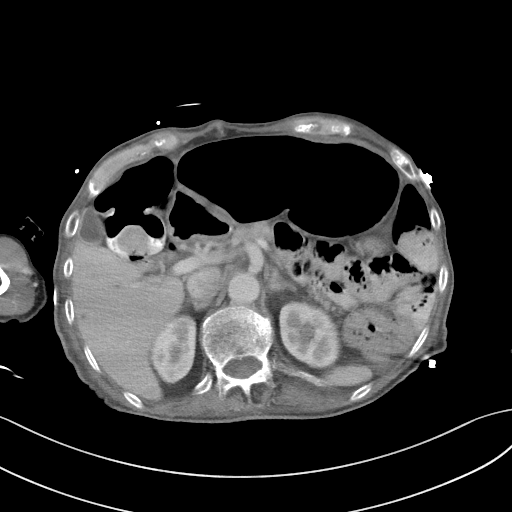
[im 76/87  soft-tissue]
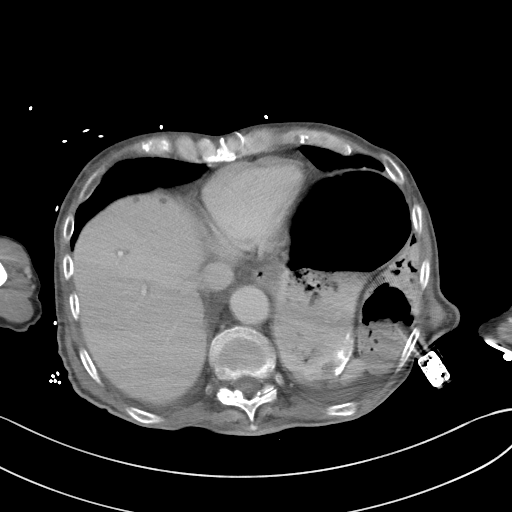
[im 81/87  soft-tissue]
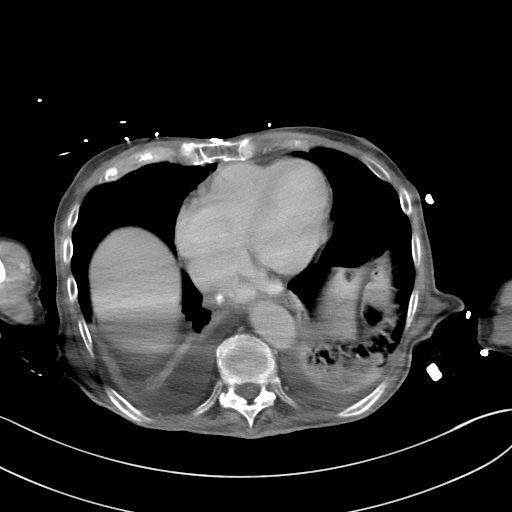

[Series 5: coronal st · coronal · 0.60mm/px · 3 of 78 slices shown]
[im 26/78  soft-tissue]
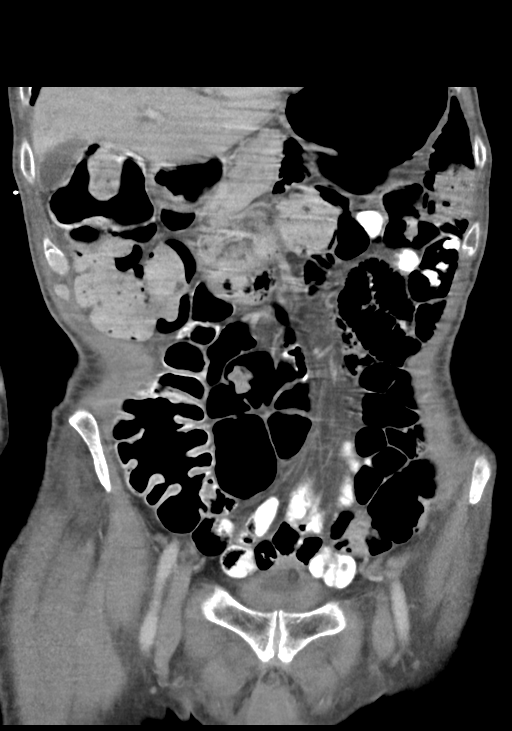
[im 35/78  soft-tissue]
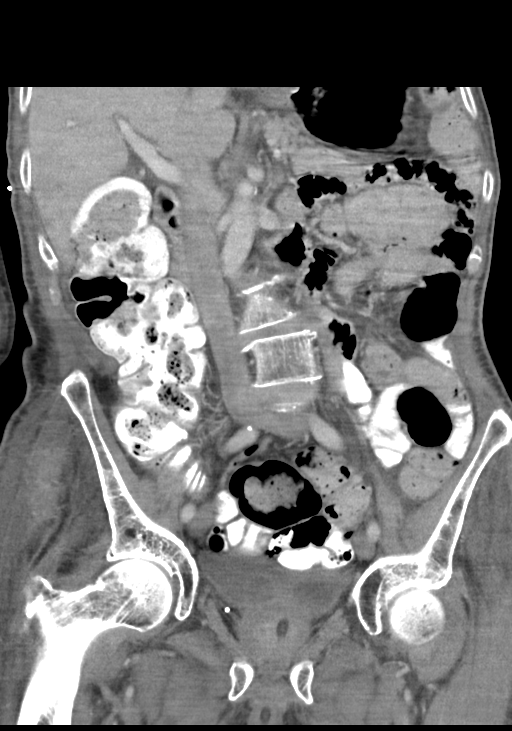
[im 43/78  soft-tissue]
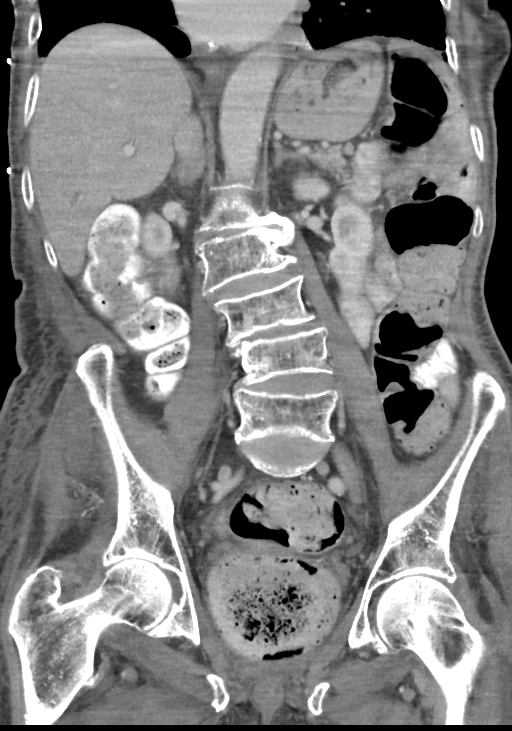

[15 of 46 positions shown; findings below may reference images not displayed]

FINDINGS: Lower chest: Median sternotomy has been performed. Cardiac size
within normal limits. No pericardial effusion. Small bilateral
pleural effusions have developed with associated bibasilar
compressive atelectasis.

Hepatobiliary: Tiny scattered cysts are seen within the liver,
stable since prior examination. The liver is otherwise unremarkable.
No intra or extrahepatic biliary ductal dilation. Gallbladder
unremarkable.

Pancreas: Unremarkable

Spleen: Unremarkable

Adrenals/Urinary Tract: The adrenal glands are unremarkable. The
kidneys are normal. Foley catheter balloon is seen within a
decompressed bladder lumen. Previously noted calculi within the
bladder lumen has been largely evacuated though a single 1-2 mm
calculus remains within the dependent right bladder lumen.

Stomach/Bowel: Moderate stool noted throughout the colon. Large
volume stool noted within the rectal vault. No definite evidence of
obstruction. The stomach, small bowel, and large bowel are otherwise
unremarkable. Appendix is normal. No free intraperitoneal gas or
fluid.

Vascular/Lymphatic: Mild aortoiliac atherosclerotic calcification.
No aortic aneurysm. No pathologic adenopathy within the abdomen and
pelvis.

Reproductive: Mild prostatic enlargement is stable.

Other: No abdominal wall hernia. Interval development of mild
subcutaneous edema within the abdominal wall, particularly within
the flanks bilaterally.

Musculoskeletal: No acute bone abnormality. Osseous structures are
diffusely osteopenic. Chronic appearing T10 compression fracture
noted. No lytic or blastic bone lesions noted.
IMPRESSION: Interval development of small bilateral pleural effusions and
diffuse body wall subcutaneous edema in keeping with mild anasarca.

Large volume stool within the rectal vault. Clinical correlation for
fecal impaction may be helpful. Superimposed moderate stool
throughout the colon without evidence of obstruction.

Minimal residual bladder calculi. Interval Foley decompression of
the bladder.

Aortic Atherosclerosis ([BZ]-[BZ]).

## 2020-09-22 MED ORDER — DILTIAZEM HCL 30 MG PO TABS
30.0000 mg | ORAL_TABLET | Freq: Two times a day (BID) | ORAL | Status: DC
  Administered 2020-09-22 – 2020-09-29 (×13): 30 mg via ORAL
  Filled 2020-09-22 (×13): qty 1

## 2020-09-22 MED ORDER — IOHEXOL 9 MG/ML PO SOLN
500.0000 mL | ORAL | Status: AC
  Administered 2020-09-22 (×2): 500 mL via ORAL

## 2020-09-22 MED ORDER — IOHEXOL 350 MG/ML SOLN
65.0000 mL | Freq: Once | INTRAVENOUS | Status: AC | PRN
  Administered 2020-09-22: 65 mL via INTRAVENOUS

## 2020-09-22 MED ORDER — WARFARIN SODIUM 3 MG PO TABS
3.0000 mg | ORAL_TABLET | Freq: Once | ORAL | Status: AC
  Administered 2020-09-22: 3 mg via ORAL
  Filled 2020-09-22: qty 1

## 2020-09-22 NOTE — Progress Notes (Addendum)
ANTICOAGULATION CONSULT NOTE - Follow Up Consult  Pharmacy Consult for warfarin & heparin  Indication: atrial fibrillation  Allergies  Allergen Reactions   Prednisone Other (See Comments)    Makes skin crawl, rapid HR   Cortisone Palpitations    Patient Measurements: Height: 5\' 7"  (170.2 cm) Weight: 46.3 kg (102 lb 1.2 oz) IBW/kg (Calculated) : 66.1  Vital Signs: Temp: 98.1 F (36.7 C) (09/03 0300) Temp Source: Axillary (09/03 0300) BP: 100/57 (09/03 0600) Pulse Rate: 83 (09/03 0725)  Labs: Recent Labs    09/20/20 0348 09/20/20 1315 09/20/20 2115 09/21/20 0242 09/22/20 0208  HGB 15.5  --   --  15.2 12.6*  HCT 46.0  --   --  48.0 39.3  PLT 146*  --   --  159 143*  LABPROT 15.0  --   --  17.1* 22.6*  INR 1.2  --   --  1.4* 2.0*  HEPARINUNFRC 0.17*   < > 0.48 0.45 0.30  CREATININE 0.61  --   --  0.80  --   CKTOTAL  --   --   --   --  26*   < > = values in this interval not displayed.     Estimated Creatinine Clearance: 49 mL/min (by C-G formula based on SCr of 0.8 mg/dL).   Medications:  Scheduled:   ascorbic acid  500 mg Oral Daily   chlorhexidine  15 mL Mouth Rinse BID   Chlorhexidine Gluconate Cloth  6 each Topical Q0600   cholecalciferol  2,000 Units Oral Daily   dextromethorphan-guaiFENesin  1 tablet Oral BID   feeding supplement  237 mL Oral BID BM   folic acid  1 mg Oral Daily   magic mouthwash  10 mL Oral TID   mouth rinse  15 mL Mouth Rinse q12n4p   mirtazapine  7.5 mg Oral QHS   multivitamin with minerals  1 tablet Oral Daily   omega-3 acid ethyl esters  1 g Oral Daily   Ensure Max Protein  11 oz Oral Daily   Warfarin - Pharmacist Dosing Inpatient   Does not apply q1600   Infusions:    Home warfarin dosing unclear on med rec.  [facility] was contacted to clarify dosing.  Current regimen is 5mg  daily PLUS 1mg  once weekly. Unable to tell me what day of the week patient is supposed to take the 1mg , but said it was started on 08/09/20  due to DDI with primidone.  Assessment: 17 yoM admitted on 8/28 with acute hypoxic and hypercarbic respite failure . PMH significant for chronic warfarin anticoagulation, AFib, s/p mitral valve annuloplasty and MAZE 2005.  Admit INR 10.9 and was given Vitamin K 2.5 mg PO. Pharmacy is consulted to resume warfarin dosing on 8/30.  Today, 09/22/2020: INR 2.0, therapeutic Per [facility], ok to stop heparin drip  CBC:  Hgb 12.6 stable/wnl, Plt low at  143 No bleeding or complications reported by RN.   DDI:  suspect that vitamin K will suppress INR for several days.  Note that patient was NOT taking primidone prior to admission.  Diet:  regular   Goal of Therapy:  Heparin level 0.3-0.7 INR 2-3 Monitor platelets by anticoagulation protocol: Yes   Plan:  Warfarin 3 mg PO x 1. Daily PT/INR, heparin level & CBC    Royetta Asal, PharmD, BCPS 09/22/2020 8:29 AM

## 2020-09-22 NOTE — Progress Notes (Signed)
Pt placed on bipap but he wanted to do without it today. No resp distress noted. VSS. RT will continue to monitor. Bipap stand by if needed.

## 2020-09-22 NOTE — Progress Notes (Signed)
Patient ID: Austin Davis, male   DOB: 12-24-1940, 80 y.o.   MRN: [id number]  PROGRESS NOTE    Austin Davis  VPX:[id number] DOB: 1940/11/09 DOA: 09/16/2020 PCP: [name], MD   Brief Narrative:  80 y.o. male with medical history significant for paroxysmal atrial fibrillation, mitral valve annuloplasty and maze procedure in 2005 on Coumadin, PAD, history of GI bleed, essential tremor, OSA not able to tolerate CPAP and hyperlipidemia, progressive health decline along with weight loss over the last year presented with increasing shortness of breath.  On presentation, he was tachycardic and slightly hypotensive and hypoxic with oxygen saturations below 90.  On initial ABG, he was acidotic with pH of 7.227 with PCO2 of 90.  He was started on BiPAP.  Chest x-ray was negative for infiltrates.  INR was 10.9 with no overt bleeding.  He was given vitamin K in the ED.  Assessment & Plan:   Acute hypoxic and hypercarbic respite failure  -Patient was hypoxic and hypercapnic on presentation.  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 -Chest x-ray negative for infiltrates.  COVID and influenza testing negative on presentation.  Required BiPAP on presentation. -Pulmonary following: Recommend to continue nocturnal ventilation nightly. -He is being set up with a home trilogy noninvasive ventilator -Echo showed EF of 45 to 50% with grade 1 diastolic dysfunction (unchanged from 2021) hypertension -Repeat CT scan on 8/30 does show small bilateral pleural effusions.  He does have some lower extremity edema.  BNP elevated at 876.  He did receive 1 dose of Lasix on 8/31 with fair urine output.  Clinically does not appear to be volume overloaded at this point -On 9/1, he was noted to be lethargic, ABG repeated and noted to have pH of 7.22 with a PCO2 101 -Placed back on BiPAP -Follow-up PCO2 improved into the 60s and mental status is also improved.  Acute  metabolic encephalopathy -Increasing lethargy today due to hypercapnia -Started back on BiPAP -Mental status has improved with downtrend of PCO2  Hypotension -Normal cortisol level  -He was on midodrine which has since been discontinued. -Blood pressures appear to be in low normal range, although with his BMI of 15, I suspect that his blood pressure normally runs on the lower side.  Failure to thrive/generalized weakness/severe malnutrition -Follow nutrition recommendations.  PT recommends SNF.  TOC consult. -had work up with endocrinology in June 2022 and had PET scan showing no evidence of paraganglioma with physiologic activity within the adrenal glands. Unfortunately no other documentation found in Epic. Recommend f/u with endocrinology outpatient  -Palliative care input appreciated, overall CODE STATUS has been adjusted to partial code.  Further discussion depending on his course. -Discussed in detail with Dr. Hilma Favors and underlying process to explain his general failure to thrive with pulm involvement is not entirely clear -He has undergone CT head that did not show any significant findings other than mild atrophy and small vessel disease changes -CT chest was checked and found to be unrevealing -Since it has been several months since his CT abdomen was checked and patient has continued to have significant weight loss, will recheck CT abdomen pelvis -Think it would be helpful if we obtain MRI brain, although I do not feel he would be able to lay flat for an extended period of time for the study.  Supratherapeutic INR/history of mitral annuloplasty Paroxysmal A. fib status post maze procedure -INR 10.9 on presentation.  Status post  vitamin K given in the ED.  INR i trended down to 1.4.  Coumadin has been resumed and INR is now therapeutic -Echo as above. -Outpatient follow-up with cardiology.  Documented to have intolerance to beta-blocker in the past.   -He has been having intermittent  episodes of SVT -Blood pressure has been borderline -We will give a trial of low-dose diltiazem  Essential tremor -Followed by neurology.  Could not tolerate beta-blockers in the past.  Apparently declined gabapentin -Continue as needed Xanax.  Outpatient follow-up with PCP/neurology  Right subconjunctival hemorrhage -Continue to monitor  Stage I pressure sacral ulcer: Present on admission -Continue local wound care  DVT prophylaxis: Heparin infusion/Coumadin Code Status: Partial code, no intubation, CPR or defibrillation Family Communication: Discussed with son and daughter at the bedside Disposition Plan: Status is: Inpatient  Remains inpatient appropriate because:Inpatient level of care appropriate due to severity of illness  Dispo: The patient is from: Home              Anticipated d/c is to: Skilled nursing facility              Patient currently is not medically stable to d/c.   Difficult to place patient No   Consultants: PCCM/palliative care  Procedures: Echo  Antimicrobials: None   Subjective: Patient is seen while sitting up in bed.  Continues to wear his BiPAP while sleeping.  Family states that he has been drinking some Ensure, but p.o. intake remains poor.  Patient reports that most of what he eats either seems to have an oily consistency or has a metallic taste.  He is also noted that he has had some difficulty swallowing and often coughs between bites.  Describes having blurry vision for the past several weeks, he is not sure if it is worse at certain times of the day  Objective: Vitals:   09/22/20 1500 09/22/20 1600 09/22/20 1640 09/22/20 1700  BP: 116/70 95/65  125/76  Pulse: (!) 109 99  (!) 103  Resp: 20 (!) 37  (!) 29  Temp:   97.8 F (36.6 C)   TempSrc:   Axillary   SpO2: 100% 100%  100%  Weight:      Height:        Intake/Output Summary (Last 24 hours) at 09/22/2020 1804 Last data filed at 09/22/2020 1647 Gross per 24 hour  Intake 346.95 ml   Output 725 ml  Net -378.05 ml   Filed Weights   09/16/20 1450 09/21/20 1439  Weight: 45.4 kg 46.3 kg    Examination:  General exam: Alert, awake, oriented x 3 Respiratory system: Clear to auscultation. Increased respiratory effort Cardiovascular system:RRR. No murmurs, rubs, gallops. Gastrointestinal system: Abdomen is nondistended, soft and nontender. No organomegaly or masses felt. Normal bowel sounds heard. Central nervous system: Alert and oriented. No focal neurological deficits. Extremities: No C/C/E, +pedal pulses Skin: No rashes, lesions or ulcers Psychiatry: Judgement and insight appear normal. Mood & affect appropriate.     Data Reviewed: I have personally reviewed following labs and imaging studies  CBC: Recent Labs  Lab 09/16/20 1512 09/17/20 0439 09/18/20 0235 09/19/20 0234 09/20/20 0348 09/21/20 0242 09/22/20 0208  WBC 8.5   < > 7.9 8.2 9.6 7.6 5.4  NEUTROABS 7.0  --  6.3 6.7  --   --   --   HGB 17.8*   < > 12.9* 13.5 15.5 15.2 12.6*  HCT 55.1*   < > 41.1 42.0 46.0 48.0 39.3  MCV 98.2   < >  101.7* 99.3 95.8 101.5* 99.7  PLT 166   < > 118* 108* 146* 159 143*   < > = values in this interval not displayed.   Basic Metabolic Panel: Recent Labs  Lab 09/17/20 0439 09/17/20 1913 09/18/20 0235 09/19/20 0234 09/20/20 0348 09/21/20 0242  NA 138  --  140 139 137 141  K 4.5  --  4.3 4.1 4.0 3.8  CL 99  --  100 97* 94* 92*  CO2 27  --  36* 34* 32 42*  GLUCOSE 80  --  112* 111* 126* 127*  BUN 31*  --  25* 21 16 30*  CREATININE 0.81  --  0.55* 0.52* 0.61 0.80  CALCIUM 8.5*  --  8.6* 8.7* 9.0 9.1  MG  --   --  2.3  --  2.0  --   PHOS  --  2.7  --   --  2.2*  --    GFR: Estimated Creatinine Clearance: 49 mL/min (by C-G formula based on SCr of 0.8 mg/dL). Liver Function Tests: Recent Labs  Lab 09/16/20 1512 09/18/20 0235 09/20/20 0348  AST 34 25 23  ALT 43 35 31  ALKPHOS 46 37* 43  BILITOT 1.4* 0.9 1.5*  PROT 6.7 4.9* 5.6*  ALBUMIN 4.4 3.2* 3.5    Recent Labs  Lab 09/16/20 1513  LIPASE 36   No results for input(s): AMMONIA in the last 168 hours. Coagulation Profile: Recent Labs  Lab 09/18/20 0235 09/19/20 0234 09/20/20 0348 09/21/20 0242 09/22/20 0208  INR 2.3* 1.4* 1.2 1.4* 2.0*   Cardiac Enzymes: Recent Labs  Lab 09/22/20 0208  CKTOTAL 26*   BNP (last 3 results) No results for input(s): PROBNP in the last 8760 hours. HbA1C: No results for input(s): HGBA1C in the last 72 hours. CBG: No results for input(s): GLUCAP in the last 168 hours. Lipid Profile: No results for input(s): CHOL, HDL, LDLCALC, TRIG, CHOLHDL, LDLDIRECT in the last 72 hours. Thyroid Function Tests: No results for input(s): TSH, T4TOTAL, FREET4, T3FREE, THYROIDAB in the last 72 hours.  Anemia Panel: No results for input(s): VITAMINB12, FOLATE, FERRITIN, TIBC, IRON, RETICCTPCT in the last 72 hours.  Sepsis Labs: No results for input(s): PROCALCITON, LATICACIDVEN in the last 168 hours.  Recent Results (from the past 240 hour(s))  Resp Panel by RT-PCR (Flu A&B, Covid) Nasopharyngeal Swab     Status: None   Collection Time: 09/16/20  3:22 PM   Specimen: Nasopharyngeal Swab; Nasopharyngeal(NP) swabs in vial transport medium  Result Value Ref Range Status   SARS Coronavirus 2 by RT PCR NEGATIVE NEGATIVE Final    Comment: (NOTE) SARS-CoV-2 target nucleic acids are NOT DETECTED.  The SARS-CoV-2 RNA is generally detectable in upper respiratory specimens during the acute phase of infection. The lowest concentration of SARS-CoV-2 viral copies this assay can detect is 138 copies/mL. A negative result does not preclude SARS-Cov-2 infection and should not be used as the sole basis for treatment or other patient management decisions. A negative result may occur with  improper specimen collection/handling, submission of specimen other than nasopharyngeal swab, presence of viral mutation(s) within the areas targeted by this assay, and inadequate  number of viral copies(<138 copies/mL). A negative result must be combined with clinical observations, patient history, and epidemiological information. The expected result is Negative.  Fact Sheet for Patients:  EntrepreneurPulse.com.au  Fact Sheet for Healthcare Providers:  IncredibleEmployment.be  This test is no t yet approved or cleared by the [place] FDA and  has  been authorized for detection and/or diagnosis of SARS-CoV-2 by FDA under an Emergency Use Authorization (EUA). This EUA will remain  in effect (meaning this test can be used) for the duration of the COVID-19 declaration under Section 564(b)(1) of the Act, 21 U.S.C.section 360bbb-3(b)(1), unless the authorization is terminated  or revoked sooner.       Influenza A by PCR NEGATIVE NEGATIVE Final   Influenza B by PCR NEGATIVE NEGATIVE Final    Comment: (NOTE) The Xpert Xpress SARS-CoV-2/FLU/RSV plus assay is intended as an aid in the diagnosis of influenza from Nasopharyngeal swab specimens and should not be used as a sole basis for treatment. Nasal washings and aspirates are unacceptable for Xpert Xpress SARS-CoV-2/FLU/RSV testing.  Fact Sheet for Patients: EntrepreneurPulse.com.au  Fact Sheet for Healthcare Providers: IncredibleEmployment.be  This test is not yet approved or cleared by the [place] FDA and has been authorized for detection and/or diagnosis of SARS-CoV-2 by FDA under an Emergency Use Authorization (EUA). This EUA will remain in effect (meaning this test can be used) for the duration of the COVID-19 declaration under Section 564(b)(1) of the Act, 21 U.S.C. section 360bbb-3(b)(1), unless the authorization is terminated or revoked.  Performed at [facility], [place] [address], [place], [place] [address]   MRSA Next Gen by PCR, Nasal     Status: None   Collection Time: 09/16/20  8:23 PM    Specimen: Nasal Mucosa; Nasal Swab  Result Value Ref Range Status   MRSA by PCR Next Gen NOT DETECTED NOT DETECTED Final    Comment: (NOTE) The GeneXpert MRSA Assay (FDA approved for NASAL specimens only), is one component of a comprehensive MRSA colonization surveillance program. It is not intended to diagnose MRSA infection nor to guide or monitor treatment for MRSA infections. Test performance is not FDA approved in patients less than 75 years old. Performed at [facility], [place] [address] [place], [place] [address]          Radiology Studies: CT HEAD WO CONTRAST (5MM)  Result Date: 09/21/2020 CLINICAL DATA:  Vertigo, central. EXAM: CT HEAD WITHOUT CONTRAST TECHNIQUE: Contiguous axial images were obtained from the base of the skull through the vertex without intravenous contrast. COMPARISON:  None. FINDINGS: Brain: Mild generalized cerebral and cerebellar atrophy. Mild patchy and ill-defined hypoattenuation within the cerebral white matter, nonspecific but compatible with chronic small vessel ischemic disease. There is no acute intracranial hemorrhage. No demarcated cortical infarct. No extra-axial fluid collection. No evidence of an intracranial mass. No midline shift. Vascular: No hyperdense vessel.  Atherosclerotic calcifications Skull: Normal. Negative for fracture or focal lesion. Sinuses/Orbits: Visualized orbits show no acute finding. No significant paranasal sinus disease at the imaged levels. IMPRESSION: No evidence of acute intracranial abnormality. Mild chronic small-vessel ischemic changes within the cerebral white matter. Mild generalized parenchymal atrophy. Electronically Signed   By: Kellie Simmering D.O.   On: 09/21/2020 14:09        Scheduled Meds:  ascorbic acid  500 mg Oral Daily   chlorhexidine  15 mL Mouth Rinse BID   Chlorhexidine Gluconate Cloth  6 each Topical Q0600   cholecalciferol  2,000 Units Oral Daily   dextromethorphan-guaiFENesin  1  tablet Oral BID   diltiazem  30 mg Oral Q12H   feeding supplement  237 mL Oral BID BM   folic acid  1 mg Oral Daily   magic mouthwash  10 mL Oral TID   mouth rinse  15 mL Mouth Rinse q12n4p  mirtazapine  7.5 mg Oral QHS   multivitamin with minerals  1 tablet Oral Daily   omega-3 acid ethyl esters  1 g Oral Daily   Ensure Max Protein  11 oz Oral Daily   Warfarin - Pharmacist Dosing Inpatient   Does not apply q1600   Continuous Infusions:          Kathie Dike, MD [facility] 09/22/2020, 6:04 PM

## 2020-09-23 DIAGNOSIS — E43 Unspecified severe protein-calorie malnutrition: Secondary | ICD-10-CM | POA: Diagnosis not present

## 2020-09-23 DIAGNOSIS — J9621 Acute and chronic respiratory failure with hypoxia: Secondary | ICD-10-CM | POA: Diagnosis not present

## 2020-09-23 DIAGNOSIS — G250 Essential tremor: Secondary | ICD-10-CM | POA: Diagnosis not present

## 2020-09-23 DIAGNOSIS — R627 Adult failure to thrive: Secondary | ICD-10-CM | POA: Diagnosis not present

## 2020-09-23 LAB — CBC
HCT: 38 % — ABNORMAL LOW (ref 39.0–52.0)
Hemoglobin: 12.1 g/dL — ABNORMAL LOW (ref 13.0–17.0)
MCH: 32 pg (ref 26.0–34.0)
MCHC: 31.8 g/dL (ref 30.0–36.0)
MCV: 100.5 fL — ABNORMAL HIGH (ref 80.0–100.0)
Platelets: 147 10*3/uL — ABNORMAL LOW (ref 150–400)
RBC: 3.78 MIL/uL — ABNORMAL LOW (ref 4.22–5.81)
RDW: 14.9 % (ref 11.5–15.5)
WBC: 5.4 10*3/uL (ref 4.0–10.5)
nRBC: 0 % (ref 0.0–0.2)

## 2020-09-23 LAB — RENAL FUNCTION PANEL
Albumin: 3.1 g/dL — ABNORMAL LOW (ref 3.5–5.0)
Anion gap: 10 (ref 5–15)
BUN: 23 mg/dL (ref 8–23)
CO2: 37 mmol/L — ABNORMAL HIGH (ref 22–32)
Calcium: 8.5 mg/dL — ABNORMAL LOW (ref 8.9–10.3)
Chloride: 91 mmol/L — ABNORMAL LOW (ref 98–111)
Creatinine, Ser: 0.5 mg/dL — ABNORMAL LOW (ref 0.61–1.24)
GFR, Estimated: >60 mL/min (ref >60)
Glucose, Bld: 91 mg/dL (ref 70–99)
Phosphorus: 2 mg/dL — ABNORMAL LOW (ref 2.5–4.6)
Potassium: 4.1 mmol/L (ref 3.5–5.1)
Sodium: 138 mmol/L (ref 135–145)

## 2020-09-23 LAB — QUANTIFERON-TB GOLD PLUS (RQFGPL)
QuantiFERON Mitogen Value: 1.25 IU/mL
QuantiFERON Nil Value: 0.02 IU/mL
QuantiFERON TB1 Ag Value: 0.02 IU/mL
QuantiFERON TB2 Ag Value: 0.02 IU/mL

## 2020-09-23 LAB — HEPARIN LEVEL (UNFRACTIONATED): Heparin Unfractionated: 0.2 IU/mL — ABNORMAL LOW (ref 0.30–0.70)

## 2020-09-23 LAB — PROTIME-INR
INR: 1.4 — ABNORMAL HIGH (ref 0.8–1.2)
Prothrombin Time: 17 seconds — ABNORMAL HIGH (ref 11.4–15.2)

## 2020-09-23 LAB — QUANTIFERON-TB GOLD PLUS: QuantiFERON-TB Gold Plus: NEGATIVE

## 2020-09-23 LAB — SEDIMENTATION RATE: Sed Rate: 9 mm/hr (ref 0–16)

## 2020-09-23 MED ORDER — HEPARIN (PORCINE) 25000 UT/250ML-% IV SOLN
750.0000 [IU]/h | INTRAVENOUS | Status: DC
  Administered 2020-09-23: 750 [IU]/h via INTRAVENOUS
  Filled 2020-09-23: qty 250

## 2020-09-23 MED ORDER — SALINE SPRAY 0.65 % NA SOLN
1.0000 | NASAL | Status: DC | PRN
  Administered 2020-09-27: 1 via NASAL
  Filled 2020-09-23 (×2): qty 44

## 2020-09-23 MED ORDER — HEPARIN BOLUS VIA INFUSION
1000.0000 [IU] | Freq: Once | INTRAVENOUS | Status: AC
  Administered 2020-09-23: 1000 [IU] via INTRAVENOUS
  Filled 2020-09-23: qty 1000

## 2020-09-23 MED ORDER — HEPARIN (PORCINE) 25000 UT/250ML-% IV SOLN
850.0000 [IU]/h | INTRAVENOUS | Status: DC
  Administered 2020-09-23: 850 [IU]/h via INTRAVENOUS

## 2020-09-23 MED ORDER — FUROSEMIDE 20 MG PO TABS
20.0000 mg | ORAL_TABLET | Freq: Every day | ORAL | Status: DC
  Administered 2020-09-23 – 2020-09-24 (×2): 20 mg via ORAL
  Filled 2020-09-23 (×2): qty 1

## 2020-09-23 MED ORDER — HEPARIN BOLUS VIA INFUSION
2000.0000 [IU] | Freq: Once | INTRAVENOUS | Status: AC
  Administered 2020-09-23: 2000 [IU] via INTRAVENOUS
  Filled 2020-09-23: qty 2000

## 2020-09-23 MED ORDER — MAGNESIUM HYDROXIDE 400 MG/5ML PO SUSP
30.0000 mL | ORAL | Status: AC
  Administered 2020-09-23: 30 mL via ORAL
  Filled 2020-09-23: qty 30

## 2020-09-23 MED ORDER — WARFARIN SODIUM 4 MG PO TABS
4.0000 mg | ORAL_TABLET | Freq: Once | ORAL | Status: AC
  Administered 2020-09-23: 4 mg via ORAL
  Filled 2020-09-23: qty 1

## 2020-09-23 NOTE — Progress Notes (Signed)
ANTICOAGULATION CONSULT NOTE - Follow Up Consult  Pharmacy Consult for warfarin & heparin  Indication: atrial fibrillation  Allergies  Allergen Reactions   Prednisone Other (See Comments)    Makes skin crawl, rapid HR   Cortisone Palpitations    Patient Measurements: Height: 5\' 7"  (170.2 cm) Weight: 46.3 kg (102 lb 1.2 oz) IBW/kg (Calculated) : 66.1  Vital Signs: Temp: 98 F (36.7 C) (09/04 0800) Temp Source: Oral (09/04 0800) BP: 125/68 (09/04 0700) Pulse Rate: 101 (09/04 0700)  Labs: Recent Labs    09/20/20 2115 09/21/20 0242 09/21/20 0242 09/22/20 0208 09/23/20 0259  HGB  --  15.2   < > 12.6* 12.1*  HCT  --  48.0  --  39.3 38.0*  PLT  --  159  --  143* 147*  LABPROT  --  17.1*  --  22.6* 17.0*  INR  --  1.4*  --  2.0* 1.4*  HEPARINUNFRC 0.48 0.45  --  0.30  --   CREATININE  --  0.80  --   --  0.50*  CKTOTAL  --   --   --  26*  --    < > = values in this interval not displayed.     Estimated Creatinine Clearance: 49 mL/min (A) (by C-G formula based on SCr of 0.5 mg/dL (L)).   Medications:  Scheduled:   ascorbic acid  500 mg Oral Daily   chlorhexidine  15 mL Mouth Rinse BID   Chlorhexidine Gluconate Cloth  6 each Topical Q0600   cholecalciferol  2,000 Units Oral Daily   dextromethorphan-guaiFENesin  1 tablet Oral BID   diltiazem  30 mg Oral Q12H   feeding supplement  237 mL Oral BID BM   folic acid  1 mg Oral Daily   magic mouthwash  10 mL Oral TID   mouth rinse  15 mL Mouth Rinse q12n4p   mirtazapine  7.5 mg Oral QHS   multivitamin with minerals  1 tablet Oral Daily   omega-3 acid ethyl esters  1 g Oral Daily   Ensure Max Protein  11 oz Oral Daily   Warfarin - Pharmacist Dosing Inpatient   Does not apply q1600   Infusions:    Home warfarin dosing unclear on med rec.  [facility] was contacted to clarify dosing.  Current regimen is 5mg  daily PLUS 1mg  once weekly. Unable to tell me what day of the week patient is supposed to take the 1mg ,  but said it was started on 08/09/20 due to DDI with primidone.  Assessment: 37 yoM admitted on 8/28 with acute hypoxic and hypercarbic respite failure . PMH significant for chronic warfarin anticoagulation, AFib, s/p mitral valve annuloplasty and MAZE 2005.  Admit INR 10.9 and was given Vitamin K 2.5 mg PO. Pharmacy is consulted to resume warfarin dosing on 8/30.  Today, 09/23/2020: INR 1.4, subtherapeutic Per [facility], Resume heparin drip for bridging  CBC:  Hgb 12.1 stable/wnl, Plt stable at 147  No bleeding or complications reported by RN.   DDI:  suspect that vitamin K will suppress INR for several days.  Note that patient was NOT taking primidone prior to admission.  Diet:  regular   Goal of Therapy:  Heparin level 0.3-0.7 INR 2-3 Monitor platelets by anticoagulation protocol: Yes   Plan:  Heparin 2000 unit bolus followed by heparin 750 units/hr  Obtain HL 8 hours after start of infusion  Warfarin 4 mg PO x 1. Daily PT/INR, heparin level & CBC  Royetta Asal, PharmD, BCPS 09/23/2020 8:35 AM

## 2020-09-23 NOTE — Evaluation (Signed)
Clinical/Bedside Swallow Evaluation Patient Details  Name: Austin Davis MRN: [id number] Date of Birth: [birth year]  Today's Date: 09/23/2020 Time: SLP Start Time (ACUTE ONLY): 1400 SLP Stop Time (ACUTE ONLY): 1425 SLP Time Calculation (min) (ACUTE ONLY): 25 min  Past Medical History:  Past Medical History:  Diagnosis Date   CHF (congestive heart failure) ([place])    Coronary artery disease    GI bleed 2019   hospitalized at [facility] for one week   Headache    since childhood   Low blood sugar    since childhood, controlled by diet   Mitral regurgitation    Mitral valve prolapse    Osteopenia 2021   Paroxysmal atrial fibrillation ([place])    Past Surgical History:  Past Surgical History:  Procedure Laterality Date   COLONOSCOPY WITH PROPOFOL N/A 02/19/2017   Procedure: COLONOSCOPY WITH PROPOFOL;  Surgeon: Wilford Corner, MD;  Location: [facility] ENDOSCOPY;  Service: Endoscopy;  Laterality: N/A;   laser eye surgery for retina detachment     MITRAL VALVE REPAIR  01/2003   monitor  02/05/2006   polyp removal     TONSILLECTOMY     tooth removal     as a teenager   HPI:  80 y.o. male; SLP reconsulted due to report to MD of occasional coughing between sips. Pt was seen by SLP at bedside on 8/29 without subjective finding of dysphagia. Pt did report reflux and poor appetite, metallic taste to food, weight loss over 2 months. Pt with medical history significant for paroxysmal atrial fibrillation, mitral valve annuloplasty , PAD, history of GI bleed, essential tremor, OSA not able to tolerate CPAP and hyperlipidemia, progressive health declineand weight loss. Presented 09/16/2020 with increasing shortness of breath, tachycardic and slightly hypotensive and hypoxic with oxygen saturations below 90.  On initial ABG, he was acidotic with pH of 7.227 with PCO2 of 90.  He was started on BiPAP.  Chest x-ray was negative for infiltrates.  INR was 10.9   Assessment / Plan / Recommendation Clinical  Impression  Pt demonstrates no signs of aspiration, but does have an oral dyspahgia due to severely dry oral mucosa and lack of saliva for bolus preparation. Cues for a liquid wash were beneficial. He is additionally short of breath with accessory muscle use and weak cough; O2 slips in and out of the 80s and low 90s. Pt fatigues with speech and swallowing. He is at risk for a respiratory based dysphagia and potentially decreased sensation of aspiration. Would recommend instrumental testing when/if his respiratory endurance is appropriate for the test. In the meantime, would not restrict oral intake. Pt may benefit from humidified oxygen to moisten his mucosa, as well as review of med list for causes of severe xerostomia. Will f/u for tolerance and MBS readiness. SLP Visit Diagnosis: Dysphagia, unspecified (R13.10)    Aspiration Risk  Moderate aspiration risk;Risk for inadequate nutrition/hydration    Diet Recommendation Regular;Thin liquid   Liquid Administration via: Cup Medication Administration: Whole meds with liquid Supervision: Patient able to self feed Compensations: Follow solids with liquid Postural Changes: Seated upright at 90 degrees    Other  Recommendations     Follow up Recommendations None      Frequency and Duration min 2x/week  2 weeks       Prognosis Prognosis for Safe Diet Advancement: Good      Swallow Study   General HPI: 80 y.o. male; SLP reconsulted due to report to MD of occasional coughing between sips. Pt  was seen by SLP at bedside on 8/29 without subjective finding of dysphagia. Pt did report reflux and poor appetite, metallic taste to food, weight loss over 2 months. Pt with medical history significant for paroxysmal atrial fibrillation, mitral valve annuloplasty , PAD, history of GI bleed, essential tremor, OSA not able to tolerate CPAP and hyperlipidemia, progressive health declineand weight loss. Presented 09/16/2020 with increasing shortness of breath,  tachycardic and slightly hypotensive and hypoxic with oxygen saturations below 90.  On initial ABG, he was acidotic with pH of 7.227 with PCO2 of 90.  He was started on BiPAP.  Chest x-ray was negative for infiltrates.  INR was 10.9 Type of Study: Bedside Swallow Evaluation Diet Prior to this Study: Regular;Thin liquids Temperature Spikes Noted: No Respiratory Status: Nasal cannula History of Recent Intubation: No Behavior/Cognition: Alert;Cooperative;Pleasant mood Oral Cavity Assessment: Dry Oral Care Completed by SLP: No Oral Cavity - Dentition: Adequate natural dentition Vision: Functional for self-feeding Self-Feeding Abilities: Able to feed self Patient Positioning: Upright in bed Volitional Cough: Weak Volitional Swallow: Able to elicit    Oral/Motor/Sensory Function Overall Oral Motor/Sensory Function: Within functional limits   Ice Chips     Thin Liquid Thin Liquid: Within functional limits Presentation: Self Fed Other Comments: prefers drinking from a bottle    Nectar Thick Nectar Thick Liquid: Not tested   Honey Thick Honey Thick Liquid: Not tested   Puree Puree: Not tested   Solid     Solid: Impaired Presentation: Spoon Other Comments: globus     Herbie Baltimore, MA CCC-SLP  Acute Rehabilitation Services Pager [phone] Office [phone]  Lynann Beaver 09/23/2020,2:31 PM

## 2020-09-23 NOTE — Progress Notes (Addendum)
Patient ID: Austin Davis, male   DOB: January 27, 1940, 80 y.o.   MRN: [id number]  PROGRESS NOTE    Austin Davis  HAL:[id number] DOB: 01/06/1941 DOA: 09/16/2020 PCP: [name], MD   Brief Narrative:  80 y.o. male with medical history significant for paroxysmal atrial fibrillation, mitral valve annuloplasty and maze procedure in 2005 on Coumadin, PAD, history of GI bleed, essential tremor, OSA not able to tolerate CPAP and hyperlipidemia, progressive health decline along with weight loss over the last year presented with increasing shortness of breath.  On presentation, he was tachycardic and slightly hypotensive and hypoxic with oxygen saturations below 90.  On initial ABG, he was acidotic with pH of 7.227 with PCO2 of 90.  He was started on BiPAP.  Chest x-ray was negative for infiltrates.  INR was 10.9 with no overt bleeding.  He was given vitamin K in the ED.  Assessment & Plan:   Acute hypoxic and hypercarbic respite failure  -Patient was hypoxic and hypercapnic on presentation.  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 -Chest x-ray negative for infiltrates.  COVID and influenza testing negative on presentation.  Required BiPAP on presentation. -Pulmonary following: Recommend to continue nocturnal ventilation nightly. -He is being set up with a home trilogy noninvasive ventilator -Echo showed EF of 45 to 50% with grade 1 diastolic dysfunction (unchanged from 2021) hypertension -Repeat CT scan on 8/30 does show small bilateral pleural effusions.  He does have some lower extremity edema.  BNP elevated at 876.  He did receive 1 dose of Lasix on 8/31 with fair urine output.  Clinically does not appear to be volume overloaded at this point -On 9/1, he was noted to be lethargic, ABG repeated and noted to have pH of 7.22 with a PCO2 101 -Placed back on BiPAP -Follow-up PCO2 improved into the 60s and mental status is also improved.  Acute  metabolic encephalopathy -Increasing lethargy today due to hypercapnia -Started back on BiPAP -Mental status has improved with downtrend of PCO2  Hypotension -Normal cortisol level  -He was on midodrine which has since been discontinued. -Blood pressures appear to be in low normal range, although with his BMI of 15, I suspect that his blood pressure normally runs on the lower side.  Failure to thrive/generalized weakness/severe malnutrition -Follow nutrition recommendations.  PT recommends SNF.  TOC consult. -had work up with endocrinology in June 2022 and had PET scan showing no evidence of paraganglioma with physiologic activity within the adrenal glands. Unfortunately no other documentation found in Epic. Recommend f/u with endocrinology outpatient  -Palliative care input appreciated, overall CODE STATUS has been adjusted to partial code.  Further discussion depending on his course. -Discussed in detail with Dr. Hilma Favors and underlying process to explain his general failure to thrive with pulm involvement is not entirely clear -He has undergone CT head that did not show any significant findings other than mild atrophy and small vessel disease changes -CT chest was checked and found to be unrevealing -Since it has been several months since his CT abdomen was checked and patient has continued to have significant weight loss, will recheck CT abdomen pelvis -Think it would be helpful if we obtain MRI brain, although I do not feel he would be able to lay flat for an extended period of time for the study. -He does have bilateral pleural effusions likely related to low protein state/anasarca -We will give a trial of Lasix to  see if diuresis will allow him to lay flat.  Dysphagia -Reports longstanding dysphagia for the past year -Speech therapy consulted  Dysgeusia -Reports that for the past year, he feels that most things he tries to eat have a metallic taste or oily consistency -Checking  heavy metal screen -Check ESR -CK noted to be normal -B12 noted to be normal -We will also check B1  Supratherapeutic INR/history of mitral annuloplasty Paroxysmal A. fib status post maze procedure -INR 10.9 on presentation.  Status post vitamin K given in the ED.  INR i trended down to 1.4.  Coumadin has been resumed and he is also on heparin bridge -Echo as above. -Outpatient follow-up with cardiology.  Documented to have intolerance to beta-blocker in the past.   -He has been having intermittent episodes of SVT -Blood pressure has been borderline -We will give a trial of low-dose diltiazem  Essential tremor -Followed by neurology.  Could not tolerate beta-blockers in the past.  Apparently declined gabapentin -Continue as needed Xanax.  Outpatient follow-up with PCP/neurology  Right subconjunctival hemorrhage -Continue to monitor  Stage I pressure sacral ulcer: Present on admission -Continue local wound care  DVT prophylaxis: Heparin infusion/Coumadin Code Status: Partial code, no intubation, CPR or defibrillation Family Communication: Discussed with son and daughter at the bedside Disposition Plan: Status is: Inpatient  Remains inpatient appropriate because:Inpatient level of care appropriate due to severity of illness  Dispo: The patient is from: Home              Anticipated d/c is to: Skilled nursing facility              Patient currently is not medically stable to d/c.   Difficult to place patient No   Consultants: PCCM/palliative care  Procedures: Echo  Antimicrobials: None   Subjective: Feels her breathing is about baseline.  He has difficulty laying flat.  He has not had a bowel movement in several days.  Continues to have poor p.o. intake although he is having protein shakes.  Objective: Vitals:   09/23/20 1600 09/23/20 1800 09/23/20 1853 09/23/20 1908  BP: (!) 155/111     Pulse: (!) 112 99 98   Resp: (!) 31 (!) 21 (!) 21   Temp: 97.9 F (36.6 C)    97.7 F (36.5 C)  TempSrc: Oral   Axillary  SpO2: 90% 91% 90%   Weight:      Height:        Intake/Output Summary (Last 24 hours) at 09/23/2020 1946 Last data filed at 09/23/2020 1827 Gross per 24 hour  Intake 1158.81 ml  Output 1450 ml  Net -291.19 ml   Filed Weights   09/16/20 1450 09/21/20 1439  Weight: 45.4 kg 46.3 kg    Examination:  General exam: Alert, awake, oriented x 3 Respiratory system: Diminished breath sounds at bases. Respiratory effort normal. Cardiovascular system:RRR. No murmurs, rubs, gallops. Gastrointestinal system: Abdomen is nondistended, soft and nontender. No organomegaly or masses felt. Normal bowel sounds heard. Central nervous system: Alert and oriented. No focal neurological deficits.  No nystagmus, extraocular motions intact.  Normal tone.  He does have tremor in right hand and head/jaw Extremities: No C/C/E, +pedal pulses Skin: No rashes, lesions or ulcers Psychiatry: Judgement and insight appear normal. Mood & affect appropriate.      Data Reviewed: I have personally reviewed following labs and imaging studies  CBC: Recent Labs  Lab 09/18/20 0235 09/19/20 0234 09/20/20 0348 09/21/20 0242 09/22/20 0208 09/23/20 0259  WBC  7.9 8.2 9.6 7.6 5.4 5.4  NEUTROABS 6.3 6.7  --   --   --   --   HGB 12.9* 13.5 15.5 15.2 12.6* 12.1*  HCT 41.1 42.0 46.0 48.0 39.3 38.0*  MCV 101.7* 99.3 95.8 101.5* 99.7 100.5*  PLT 118* 108* 146* 159 143* 546*   Basic Metabolic Panel: Recent Labs  Lab 09/17/20 1913 09/18/20 0235 09/19/20 0234 09/20/20 0348 09/21/20 0242 09/23/20 0259  NA  --  140 139 137 141 138  K  --  4.3 4.1 4.0 3.8 4.1  CL  --  100 97* 94* 92* 91*  CO2  --  36* 34* 32 42* 37*  GLUCOSE  --  112* 111* 126* 127* 91  BUN  --  25* 21 16 30* 23  CREATININE  --  0.55* 0.52* 0.61 0.80 0.50*  CALCIUM  --  8.6* 8.7* 9.0 9.1 8.5*  MG  --  2.3  --  2.0  --   --   PHOS 2.7  --   --  2.2*  --  2.0*   GFR: Estimated Creatinine Clearance: 49  mL/min (A) (by C-G formula based on SCr of 0.5 mg/dL (L)). Liver Function Tests: Recent Labs  Lab 09/18/20 0235 09/20/20 0348 09/23/20 0259  AST 25 23  --   ALT 35 31  --   ALKPHOS 37* 43  --   BILITOT 0.9 1.5*  --   PROT 4.9* 5.6*  --   ALBUMIN 3.2* 3.5 3.1*   No results for input(s): LIPASE, AMYLASE in the last 168 hours.  No results for input(s): AMMONIA in the last 168 hours. Coagulation Profile: Recent Labs  Lab 09/19/20 0234 09/20/20 0348 09/21/20 0242 09/22/20 0208 09/23/20 0259  INR 1.4* 1.2 1.4* 2.0* 1.4*   Cardiac Enzymes: Recent Labs  Lab 09/22/20 0208  CKTOTAL 26*   BNP (last 3 results) No results for input(s): PROBNP in the last 8760 hours. HbA1C: No results for input(s): HGBA1C in the last 72 hours. CBG: No results for input(s): GLUCAP in the last 168 hours. Lipid Profile: No results for input(s): CHOL, HDL, LDLCALC, TRIG, CHOLHDL, LDLDIRECT in the last 72 hours. Thyroid Function Tests: No results for input(s): TSH, T4TOTAL, FREET4, T3FREE, THYROIDAB in the last 72 hours.  Anemia Panel: No results for input(s): VITAMINB12, FOLATE, FERRITIN, TIBC, IRON, RETICCTPCT in the last 72 hours.  Sepsis Labs: No results for input(s): PROCALCITON, LATICACIDVEN in the last 168 hours.  Recent Results (from the past 240 hour(s))  Resp Panel by RT-PCR (Flu A&B, Covid) Nasopharyngeal Swab     Status: None   Collection Time: 09/16/20  3:22 PM   Specimen: Nasopharyngeal Swab; Nasopharyngeal(NP) swabs in vial transport medium  Result Value Ref Range Status   SARS Coronavirus 2 by RT PCR NEGATIVE NEGATIVE Final    Comment: (NOTE) SARS-CoV-2 target nucleic acids are NOT DETECTED.  The SARS-CoV-2 RNA is generally detectable in upper respiratory specimens during the acute phase of infection. The lowest concentration of SARS-CoV-2 viral copies this assay can detect is 138 copies/mL. A negative result does not preclude SARS-Cov-2 infection and should not be used as  the sole basis for treatment or other patient management decisions. A negative result may occur with  improper specimen collection/handling, submission of specimen other than nasopharyngeal swab, presence of viral mutation(s) within the areas targeted by this assay, and inadequate number of viral copies(<138 copies/mL). A negative result must be combined with clinical observations, patient history, and epidemiological information. The  expected result is Negative.  Fact Sheet for Patients:  EntrepreneurPulse.com.au  Fact Sheet for Healthcare Providers:  IncredibleEmployment.be  This test is no t yet approved or cleared by the [place] FDA and  has been authorized for detection and/or diagnosis of SARS-CoV-2 by FDA under an Emergency Use Authorization (EUA). This EUA will remain  in effect (meaning this test can be used) for the duration of the COVID-19 declaration under Section 564(b)(1) of the Act, 21 U.S.C.section 360bbb-3(b)(1), unless the authorization is terminated  or revoked sooner.       Influenza A by PCR NEGATIVE NEGATIVE Final   Influenza B by PCR NEGATIVE NEGATIVE Final    Comment: (NOTE) The Xpert Xpress SARS-CoV-2/FLU/RSV plus assay is intended as an aid in the diagnosis of influenza from Nasopharyngeal swab specimens and should not be used as a sole basis for treatment. Nasal washings and aspirates are unacceptable for Xpert Xpress SARS-CoV-2/FLU/RSV testing.  Fact Sheet for Patients: EntrepreneurPulse.com.au  Fact Sheet for Healthcare Providers: IncredibleEmployment.be  This test is not yet approved or cleared by the [place] FDA and has been authorized for detection and/or diagnosis of SARS-CoV-2 by FDA under an Emergency Use Authorization (EUA). This EUA will remain in effect (meaning this test can be used) for the duration of the COVID-19 declaration under Section 564(b)(1) of  the Act, 21 U.S.C. section 360bbb-3(b)(1), unless the authorization is terminated or revoked.  Performed at [facility], [place] [address], [place], [place] [address]   MRSA Next Gen by PCR, Nasal     Status: None   Collection Time: 09/16/20  8:23 PM   Specimen: Nasal Mucosa; Nasal Swab  Result Value Ref Range Status   MRSA by PCR Next Gen NOT DETECTED NOT DETECTED Final    Comment: (NOTE) The GeneXpert MRSA Assay (FDA approved for NASAL specimens only), is one component of a comprehensive MRSA colonization surveillance program. It is not intended to diagnose MRSA infection nor to guide or monitor treatment for MRSA infections. Test performance is not FDA approved in patients less than 82 years old. Performed at [facility], [place] [address] [place], [place] [address]          Radiology Studies: CT ABDOMEN PELVIS W CONTRAST  Result Date: 09/22/2020 CLINICAL DATA:  Unintentional weight loss, unable to eat or drink EXAM: CT ABDOMEN AND PELVIS WITH CONTRAST TECHNIQUE: Multidetector CT imaging of the abdomen and pelvis was performed using the standard protocol following bolus administration of intravenous contrast. CONTRAST:  33m OMNIPAQUE IOHEXOL 350 MG/ML SOLN COMPARISON:  05/10/2020 FINDINGS: Lower chest: Median sternotomy has been performed. Cardiac size within normal limits. No pericardial effusion. Small bilateral pleural effusions have developed with associated bibasilar compressive atelectasis. Hepatobiliary: Tiny scattered cysts are seen within the liver, stable since prior examination. The liver is otherwise unremarkable. No intra or extrahepatic biliary ductal dilation. Gallbladder unremarkable. Pancreas: Unremarkable Spleen: Unremarkable Adrenals/Urinary Tract: The adrenal glands are unremarkable. The kidneys are normal. Foley catheter balloon is seen within a decompressed bladder lumen. Previously noted calculi within the bladder lumen has  been largely evacuated though a single 1-2 mm calculus remains within the dependent right bladder lumen. Stomach/Bowel: Moderate stool noted throughout the colon. Large volume stool noted within the rectal vault. No definite evidence of obstruction. The stomach, small bowel, and large bowel are otherwise unremarkable. Appendix is normal. No free intraperitoneal gas or fluid. Vascular/Lymphatic: Mild aortoiliac atherosclerotic calcification. No aortic aneurysm. No pathologic adenopathy within the abdomen and pelvis.  Reproductive: Mild prostatic enlargement is stable. Other: No abdominal wall hernia. Interval development of mild subcutaneous edema within the abdominal wall, particularly within the flanks bilaterally. Musculoskeletal: No acute bone abnormality. Osseous structures are diffusely osteopenic. Chronic appearing T10 compression fracture noted. No lytic or blastic bone lesions noted. IMPRESSION: Interval development of small bilateral pleural effusions and diffuse body wall subcutaneous edema in keeping with mild anasarca. Large volume stool within the rectal vault. Clinical correlation for fecal impaction may be helpful. Superimposed moderate stool throughout the colon without evidence of obstruction. Minimal residual bladder calculi. Interval Foley decompression of the bladder. Aortic Atherosclerosis (ICD10-I70.0). Electronically Signed   By: Fidela Salisbury M.D.   On: 09/22/2020 20:41        Scheduled Meds:  ascorbic acid  500 mg Oral Daily   chlorhexidine  15 mL Mouth Rinse BID   Chlorhexidine Gluconate Cloth  6 each Topical Q0600   cholecalciferol  2,000 Units Oral Daily   dextromethorphan-guaiFENesin  1 tablet Oral BID   diltiazem  30 mg Oral Q12H   feeding supplement  237 mL Oral BID BM   folic acid  1 mg Oral Daily   furosemide  20 mg Oral Daily   heparin  1,000 Units Intravenous Once   magic mouthwash  10 mL Oral TID   mouth rinse  15 mL Mouth Rinse q12n4p   mirtazapine  7.5 mg Oral  QHS   multivitamin with minerals  1 tablet Oral Daily   omega-3 acid ethyl esters  1 g Oral Daily   Ensure Max Protein  11 oz Oral Daily   Warfarin - Pharmacist Dosing Inpatient   Does not apply q1600   Continuous Infusions:  heparin             Kathie Dike, MD [facility] 09/23/2020, 7:46 PM

## 2020-09-23 NOTE — Progress Notes (Signed)
Pharmacy Brief Note - Anticoagulation Evening Follow Up:  Patient is a 62 yoM on a heparin drip while INR subtherapeutic. For full history, see note by Royetta Asal, PharmD from earlier today.   HDW = TBW = 46 kg  Assessment: HL = 0.20 is subtherapeutic on heparin infusion of 750 units/hr Confirmed with RN that heparin infusing at correct rate. No interruptions. No signs of bleeding.   Goal: HL 0.3 - 0.7  Plan: Heparin bolus of 1000 units IV once Increase heparin infusion to 850 units/hr Check 8 hour HL HL, CBC daily while on heparin infusion Monitor for signs of bleeding  Lenis Noon, PharmD 09/23/20 7:48 PM

## 2020-09-23 NOTE — Progress Notes (Signed)
Pt HR 100s. Pt showed labored breathing, RR in the 30s-40s. Asked pt if he felt sob, which the pt responded yes.Told pt needed Bipap to help with breathing/comfort. Pt tried mask on for about 3 mins but could not tolerate mask any longer. Salter was placed on pt. This nurse will continue to monitor.

## 2020-09-24 DIAGNOSIS — R627 Adult failure to thrive: Secondary | ICD-10-CM | POA: Diagnosis not present

## 2020-09-24 DIAGNOSIS — J9621 Acute and chronic respiratory failure with hypoxia: Secondary | ICD-10-CM | POA: Diagnosis not present

## 2020-09-24 DIAGNOSIS — G250 Essential tremor: Secondary | ICD-10-CM | POA: Diagnosis not present

## 2020-09-24 DIAGNOSIS — E43 Unspecified severe protein-calorie malnutrition: Secondary | ICD-10-CM | POA: Diagnosis not present

## 2020-09-24 LAB — CBC
HCT: 42.1 % (ref 39.0–52.0)
Hemoglobin: 13.5 g/dL (ref 13.0–17.0)
MCH: 31.8 pg (ref 26.0–34.0)
MCHC: 32.1 g/dL (ref 30.0–36.0)
MCV: 99.3 fL (ref 80.0–100.0)
Platelets: 156 10*3/uL (ref 150–400)
RBC: 4.24 MIL/uL (ref 4.22–5.81)
RDW: 14.7 % (ref 11.5–15.5)
WBC: 7 10*3/uL (ref 4.0–10.5)
nRBC: 0 % (ref 0.0–0.2)

## 2020-09-24 LAB — RENAL FUNCTION PANEL
Albumin: 3.2 g/dL — ABNORMAL LOW (ref 3.5–5.0)
Anion gap: 7 (ref 5–15)
BUN: 19 mg/dL (ref 8–23)
CO2: 39 mmol/L — ABNORMAL HIGH (ref 22–32)
Calcium: 8.7 mg/dL — ABNORMAL LOW (ref 8.9–10.3)
Chloride: 93 mmol/L — ABNORMAL LOW (ref 98–111)
Creatinine, Ser: 0.46 mg/dL — ABNORMAL LOW (ref 0.61–1.24)
GFR, Estimated: >60 mL/min (ref >60)
Glucose, Bld: 116 mg/dL — ABNORMAL HIGH (ref 70–99)
Phosphorus: 2.2 mg/dL — ABNORMAL LOW (ref 2.5–4.6)
Potassium: 3.7 mmol/L (ref 3.5–5.1)
Sodium: 139 mmol/L (ref 135–145)

## 2020-09-24 LAB — HEPARIN LEVEL (UNFRACTIONATED): Heparin Unfractionated: 0.3 IU/mL (ref 0.30–0.70)

## 2020-09-24 LAB — VITAMIN B1

## 2020-09-24 LAB — PROTIME-INR
INR: 1.6 — ABNORMAL HIGH (ref 0.8–1.2)
Prothrombin Time: 18.8 seconds — ABNORMAL HIGH (ref 11.4–15.2)

## 2020-09-24 MED ORDER — ZINC SULFATE 220 (50 ZN) MG PO CAPS
220.0000 mg | ORAL_CAPSULE | Freq: Two times a day (BID) | ORAL | Status: DC
  Administered 2020-09-24 – 2020-09-29 (×9): 220 mg via ORAL
  Filled 2020-09-24 (×9): qty 1

## 2020-09-24 MED ORDER — POTASSIUM PHOSPHATES 15 MMOLE/5ML IV SOLN
15.0000 mmol | Freq: Once | INTRAVENOUS | Status: AC
  Administered 2020-09-24: 15 mmol via INTRAVENOUS
  Filled 2020-09-24: qty 5

## 2020-09-24 MED ORDER — THIAMINE HCL 100 MG/ML IJ SOLN
250.0000 mg | Freq: Every day | INTRAVENOUS | Status: DC
  Administered 2020-09-28 – 2020-09-29 (×2): 250 mg via INTRAVENOUS
  Filled 2020-09-24 (×3): qty 2.5

## 2020-09-24 MED ORDER — SORBITOL 70 % SOLN
960.0000 mL | TOPICAL_OIL | Freq: Once | ORAL | Status: AC
  Administered 2020-09-24: 960 mL via RECTAL
  Filled 2020-09-24: qty 473

## 2020-09-24 MED ORDER — WARFARIN SODIUM 5 MG PO TABS
5.0000 mg | ORAL_TABLET | Freq: Once | ORAL | Status: AC
  Administered 2020-09-24: 5 mg via ORAL
  Filled 2020-09-24: qty 1

## 2020-09-24 MED ORDER — THIAMINE HCL 100 MG/ML IJ SOLN
500.0000 mg | Freq: Three times a day (TID) | INTRAMUSCULAR | Status: AC
  Administered 2020-09-24 – 2020-09-26 (×6): 500 mg via INTRAVENOUS
  Filled 2020-09-24 (×6): qty 5

## 2020-09-24 MED ORDER — PANTOPRAZOLE SODIUM 40 MG IV SOLR
40.0000 mg | Freq: Two times a day (BID) | INTRAVENOUS | Status: DC
  Administered 2020-09-24 – 2020-09-27 (×6): 40 mg via INTRAVENOUS
  Filled 2020-09-24 (×7): qty 40

## 2020-09-24 MED ORDER — FUROSEMIDE 10 MG/ML IJ SOLN
20.0000 mg | Freq: Every day | INTRAMUSCULAR | Status: DC
  Administered 2020-09-25 – 2020-09-29 (×4): 20 mg via INTRAVENOUS
  Filled 2020-09-24 (×4): qty 2

## 2020-09-24 NOTE — Progress Notes (Signed)
Patient ID: Austin Davis, male   DOB: [birth year], 80 y.o.   MRN: [id number]  PROGRESS NOTE    Austin Davis  [name]:[id number] DOB: Jun 08, 1940 DOA: 09/16/2020 PCP: [name], MD   Brief Narrative:  80 y.o. male with medical history significant for paroxysmal atrial fibrillation, mitral valve annuloplasty and maze procedure in 2005 on Coumadin, PAD, history of GI bleed, essential tremor, OSA not able to tolerate CPAP and hyperlipidemia, progressive health decline along with weight loss over the last year presented with increasing shortness of breath.  On presentation, he was tachycardic and slightly hypotensive and hypoxic with oxygen saturations below 90.  On initial ABG, he was acidotic with pH of 7.227 with PCO2 of 90.  He was started on BiPAP.  Chest x-ray was negative for infiltrates.  INR was 10.9 with no overt bleeding.  He was given vitamin K in the ED.  Assessment & Plan:   Acute hypoxic and hypercarbic respite failure  -Patient was hypoxic and hypercapnic on presentation.  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 -Chest x-ray negative for infiltrates.  COVID and influenza testing negative on presentation.  Required BiPAP on presentation. -Pulmonary following: Recommend to continue nocturnal ventilation nightly. -He is being set up with a home trilogy noninvasive ventilator -Echo showed EF of 45 to 50% with grade 1 diastolic dysfunction (unchanged from 2021) hypertension -Repeat CT scan on 8/30 does show small bilateral pleural effusions.  He does have some lower extremity edema.  BNP elevated at 876.  He is currently on IV Lasix -He has become lethargic and hypercapnic during his hospital stay requiring BiPAP -He will need to continue on BiPAP Nightly and with naps  Acute metabolic encephalopathy -Patient had worsening lethargy due to hypercapnia -PCO2 levels improved after initiating BiPAP -He will need to stay on BiPAP  nightly and with naps.   Failure to thrive/generalized weakness/severe malnutrition -Follow nutrition recommendations.  PT recommends SNF.  TOC consult. -had work up with endocrinology in June 2022 and had PET scan showing no evidence of paraganglioma with physiologic activity within the adrenal glands. Unfortunately no other documentation found in Epic. Recommend f/u with endocrinology outpatient  -Palliative care input appreciated, overall CODE STATUS has been adjusted to partial code.  Further discussion depending on his course. -Discussed in detail with Dr. Hilma Favors and underlying process to explain his general failure to thrive with pulm involvement is not entirely clear -He has undergone CT head that did not show any significant findings other than mild atrophy and small vessel disease changes -CT chest was checked and found to be unrevealing -Since it has been several months since his CT abdomen was checked and patient has continued to have significant weight loss, CT abdomen pelvis was also repeated that was fairly unrevealing. -Think it would be helpful if we obtain MRI brain, although I do not feel he would be able to lay flat for an extended period of time for the study in his current condition. -He does have bilateral pleural effusions likely related to low protein state/anasarca -He has been started on Lasix with the hopes to improve orthopnea  Constipation -Large stool burden noted on CT scan -He received milk of magnesia without results -We will provide enema  Dysphagia -Reports longstanding dysphagia for the past year -Speech therapy evaluated patient and felt as though he did have oral dysphagia due to severely dry oral mucosa and lack of saliva for bolus preparation. -  Will benefit from MBS once he is able to participate  Dysgeusia -Reports that for the past year, he feels that most things he tries to eat have a metallic taste or oily consistency -Checking heavy metal  screen -Sed rate normal -CK noted to be normal -B12 noted to be normal -We will give a trial of high-dose thiamine replacement -We will also check zinc levels and replace -Start on Protonix for any component of reflux related dysgeusia  Supratherapeutic INR/history of mitral annuloplasty Paroxysmal A. fib status post maze procedure -INR 10.9 on presentation.  Status post vitamin K given in the ED.  INR i trended down to 1.4.  Coumadin has been resumed and he is also on heparin bridge -Echo as above. -Outpatient follow-up with cardiology.  Documented to have intolerance to beta-blocker in the past.   -He has been having intermittent episodes of SVT -Blood pressure has been borderline -Continue on low-dose diltiazem  Essential tremor -Followed by neurology.  Could not tolerate beta-blockers in the past.  Apparently declined gabapentin -Continue as needed Xanax.  Outpatient follow-up with PCP/neurology  Right subconjunctival hemorrhage -Continue to monitor  Stage I pressure sacral ulcer: Present on admission -Continue local wound care  DVT prophylaxis: Heparin infusion/Coumadin Code Status: Partial code, no intubation, CPR or defibrillation Family Communication: Discussed with son and daughter at the bedside Disposition Plan: Status is: Inpatient  Remains inpatient appropriate because:Inpatient level of care appropriate due to severity of illness  Dispo: The patient is from: Home              Anticipated d/c is to: Skilled nursing facility              Patient currently is not medically stable to d/c.   Difficult to place patient No   Consultants: PCCM/palliative care  Procedures: Echo  Antimicrobials: None   Subjective: P.o. intake remains poor.  Describes metallic taste as the main deterrent for him to eat and drink.  He has been sleeping without BiPAP.  Family notes that he had a headache this morning and appeared to be more tired today.  Objective: Vitals:    09/24/20 0600 09/24/20 0800 09/24/20 1100 09/24/20 1200  BP: 130/87  137/85   Pulse: 91  (!) 104   Resp: 19  (!) 28 16  Temp:  (!) 97.4 F (36.3 C)  98.2 F (36.8 C)  TempSrc:  Oral  Oral  SpO2: 97%  98% 98%  Weight:      Height:        Intake/Output Summary (Last 24 hours) at 09/24/2020 1836 Last data filed at 09/24/2020 1800 Gross per 24 hour  Intake 227.34 ml  Output 1150 ml  Net -922.66 ml   Filed Weights   09/16/20 1450 09/21/20 1439  Weight: 45.4 kg 46.3 kg    Examination:  General exam: Alert, awake, oriented x 3, cachectic Respiratory system: Diminished breath sounds at bases. Respiratory effort normal. Cardiovascular system:RRR. No murmurs, rubs, gallops. Gastrointestinal system: Abdomen is nondistended, soft and nontender. No organomegaly or masses felt. Normal bowel sounds heard. Central nervous system: Alert and oriented. No focal neurological deficits. Extremities: No C/C/E, +pedal pulses Skin: No rashes, lesions or ulcers Psychiatry: Judgement and insight appear normal. Mood & affect appropriate.       Data Reviewed: I have personally reviewed following labs and imaging studies  CBC: Recent Labs  Lab 09/18/20 0235 09/19/20 0234 09/20/20 0348 09/21/20 0242 09/22/20 0208 09/23/20 0259 09/24/20 0418  WBC 7.9 8.2 9.6  7.6 5.4 5.4 7.0  NEUTROABS 6.3 6.7  --   --   --   --   --   HGB 12.9* 13.5 15.5 15.2 12.6* 12.1* 13.5  HCT 41.1 42.0 46.0 48.0 39.3 38.0* 42.1  MCV 101.7* 99.3 95.8 101.5* 99.7 100.5* 99.3  PLT 118* 108* 146* 159 143* 147* 993   Basic Metabolic Panel: Recent Labs  Lab 09/17/20 1913 09/18/20 0235 09/18/20 0235 09/19/20 0234 09/20/20 0348 09/21/20 0242 09/23/20 0259 09/24/20 0418  NA  --  140   < > 139 137 141 138 139  K  --  4.3   < > 4.1 4.0 3.8 4.1 3.7  CL  --  100   < > 97* 94* 92* 91* 93*  CO2  --  36*   < > 34* 32 42* 37* 39*  GLUCOSE  --  112*   < > 111* 126* 127* 91 116*  BUN  --  25*   < > 21 16 30* 23 19   CREATININE  --  0.55*   < > 0.52* 0.61 0.80 0.50* 0.46*  CALCIUM  --  8.6*   < > 8.7* 9.0 9.1 8.5* 8.7*  MG  --  2.3  --   --  2.0  --   --   --   PHOS 2.7  --   --   --  2.2*  --  2.0* 2.2*   < > = values in this interval not displayed.   GFR: Estimated Creatinine Clearance: 49 mL/min (A) (by C-G formula based on SCr of 0.46 mg/dL (L)). Liver Function Tests: Recent Labs  Lab 09/18/20 0235 09/20/20 0348 09/23/20 0259 09/24/20 0418  AST 25 23  --   --   ALT 35 31  --   --   ALKPHOS 37* 43  --   --   BILITOT 0.9 1.5*  --   --   PROT 4.9* 5.6*  --   --   ALBUMIN 3.2* 3.5 3.1* 3.2*   No results for input(s): LIPASE, AMYLASE in the last 168 hours.  No results for input(s): AMMONIA in the last 168 hours. Coagulation Profile: Recent Labs  Lab 09/20/20 0348 09/21/20 0242 09/22/20 0208 09/23/20 0259 09/24/20 0418  INR 1.2 1.4* 2.0* 1.4* 1.6*   Cardiac Enzymes: Recent Labs  Lab 09/22/20 0208  CKTOTAL 26*   BNP (last 3 results) No results for input(s): PROBNP in the last 8760 hours. HbA1C: No results for input(s): HGBA1C in the last 72 hours. CBG: No results for input(s): GLUCAP in the last 168 hours. Lipid Profile: No results for input(s): CHOL, HDL, LDLCALC, TRIG, CHOLHDL, LDLDIRECT in the last 72 hours. Thyroid Function Tests: No results for input(s): TSH, T4TOTAL, FREET4, T3FREE, THYROIDAB in the last 72 hours.  Anemia Panel: No results for input(s): VITAMINB12, FOLATE, FERRITIN, TIBC, IRON, RETICCTPCT in the last 72 hours.  Sepsis Labs: No results for input(s): PROCALCITON, LATICACIDVEN in the last 168 hours.  Recent Results (from the past 240 hour(s))  Resp Panel by RT-PCR (Flu A&B, Covid) Nasopharyngeal Swab     Status: None   Collection Time: 09/16/20  3:22 PM   Specimen: Nasopharyngeal Swab; Nasopharyngeal(NP) swabs in vial transport medium  Result Value Ref Range Status   SARS Coronavirus 2 by RT PCR NEGATIVE NEGATIVE Final    Comment:  (NOTE) SARS-CoV-2 target nucleic acids are NOT DETECTED.  The SARS-CoV-2 RNA is generally detectable in upper respiratory specimens during the acute phase of infection.  The lowest concentration of SARS-CoV-2 viral copies this assay can detect is 138 copies/mL. A negative result does not preclude SARS-Cov-2 infection and should not be used as the sole basis for treatment or other patient management decisions. A negative result may occur with  improper specimen collection/handling, submission of specimen other than nasopharyngeal swab, presence of viral mutation(s) within the areas targeted by this assay, and inadequate number of viral copies(<138 copies/mL). A negative result must be combined with clinical observations, patient history, and epidemiological information. The expected result is Negative.  Fact Sheet for Patients:  EntrepreneurPulse.com.au  Fact Sheet for Healthcare Providers:  IncredibleEmployment.be  This test is no t yet approved or cleared by the [place] FDA and  has been authorized for detection and/or diagnosis of SARS-CoV-2 by FDA under an Emergency Use Authorization (EUA). This EUA will remain  in effect (meaning this test can be used) for the duration of the COVID-19 declaration under Section 564(b)(1) of the Act, 21 U.S.C.section 360bbb-3(b)(1), unless the authorization is terminated  or revoked sooner.       Influenza A by PCR NEGATIVE NEGATIVE Final   Influenza B by PCR NEGATIVE NEGATIVE Final    Comment: (NOTE) The Xpert Xpress SARS-CoV-2/FLU/RSV plus assay is intended as an aid in the diagnosis of influenza from Nasopharyngeal swab specimens and should not be used as a sole basis for treatment. Nasal washings and aspirates are unacceptable for Xpert Xpress SARS-CoV-2/FLU/RSV testing.  Fact Sheet for Patients: EntrepreneurPulse.com.au  Fact Sheet for Healthcare  Providers: IncredibleEmployment.be  This test is not yet approved or cleared by the [place] FDA and has been authorized for detection and/or diagnosis of SARS-CoV-2 by FDA under an Emergency Use Authorization (EUA). This EUA will remain in effect (meaning this test can be used) for the duration of the COVID-19 declaration under Section 564(b)(1) of the Act, 21 U.S.C. section 360bbb-3(b)(1), unless the authorization is terminated or revoked.  Performed at [facility], [place] [address], [place], [place] [address]   MRSA Next Gen by PCR, Nasal     Status: None   Collection Time: 09/16/20  8:23 PM   Specimen: Nasal Mucosa; Nasal Swab  Result Value Ref Range Status   MRSA by PCR Next Gen NOT DETECTED NOT DETECTED Final    Comment: (NOTE) The GeneXpert MRSA Assay (FDA approved for NASAL specimens only), is one component of a comprehensive MRSA colonization surveillance program. It is not intended to diagnose MRSA infection nor to guide or monitor treatment for MRSA infections. Test performance is not FDA approved in patients less than 22 years old. Performed at [facility], [place] [address], [place], [place] [address]          Radiology Studies: CT ABDOMEN PELVIS W CONTRAST  Result Date: 09/22/2020 CLINICAL DATA:  Unintentional weight loss, unable to eat or drink EXAM: CT ABDOMEN AND PELVIS WITH CONTRAST TECHNIQUE: Multidetector CT imaging of the abdomen and pelvis was performed using the standard protocol following bolus administration of intravenous contrast. CONTRAST:  11mL OMNIPAQUE IOHEXOL 350 MG/ML SOLN COMPARISON:  05/10/2020 FINDINGS: Lower chest: Median sternotomy has been performed. Cardiac size within normal limits. No pericardial effusion. Small bilateral pleural effusions have developed with associated bibasilar compressive atelectasis. Hepatobiliary: Tiny scattered cysts are seen within the liver, stable since  prior examination. The liver is otherwise unremarkable. No intra or extrahepatic biliary ductal dilation. Gallbladder unremarkable. Pancreas: Unremarkable Spleen: Unremarkable Adrenals/Urinary Tract: The adrenal glands are unremarkable. The kidneys are normal. Foley  catheter balloon is seen within a decompressed bladder lumen. Previously noted calculi within the bladder lumen has been largely evacuated though a single 1-2 mm calculus remains within the dependent right bladder lumen. Stomach/Bowel: Moderate stool noted throughout the colon. Large volume stool noted within the rectal vault. No definite evidence of obstruction. The stomach, small bowel, and large bowel are otherwise unremarkable. Appendix is normal. No free intraperitoneal gas or fluid. Vascular/Lymphatic: Mild aortoiliac atherosclerotic calcification. No aortic aneurysm. No pathologic adenopathy within the abdomen and pelvis. Reproductive: Mild prostatic enlargement is stable. Other: No abdominal wall hernia. Interval development of mild subcutaneous edema within the abdominal wall, particularly within the flanks bilaterally. Musculoskeletal: No acute bone abnormality. Osseous structures are diffusely osteopenic. Chronic appearing T10 compression fracture noted. No lytic or blastic bone lesions noted. IMPRESSION: Interval development of small bilateral pleural effusions and diffuse body wall subcutaneous edema in keeping with mild anasarca. Large volume stool within the rectal vault. Clinical correlation for fecal impaction may be helpful. Superimposed moderate stool throughout the colon without evidence of obstruction. Minimal residual bladder calculi. Interval Foley decompression of the bladder. Aortic Atherosclerosis (ICD10-I70.0). Electronically Signed   By: Fidela Salisbury M.D.   On: 09/22/2020 20:41        Scheduled Meds:  ascorbic acid  500 mg Oral Daily   chlorhexidine  15 mL Mouth Rinse BID   Chlorhexidine Gluconate Cloth  6 each  Topical Q0600   cholecalciferol  2,000 Units Oral Daily   dextromethorphan-guaiFENesin  1 tablet Oral BID   diltiazem  30 mg Oral Q12H   feeding supplement  237 mL Oral BID BM   folic acid  1 mg Oral Daily   [START ON 09/25/2020] furosemide  20 mg Intravenous Daily   magic mouthwash  10 mL Oral TID   mouth rinse  15 mL Mouth Rinse q12n4p   mirtazapine  7.5 mg Oral QHS   multivitamin with minerals  1 tablet Oral Daily   omega-3 acid ethyl esters  1 g Oral Daily   pantoprazole (PROTONIX) IV  40 mg Intravenous Q12H   Ensure Max Protein  11 oz Oral Daily   Warfarin - Pharmacist Dosing Inpatient   Does not apply q1600   zinc sulfate  220 mg Oral BID   Continuous Infusions:  potassium PHOSPHATE IVPB (in mmol) 15 mmol (09/24/20 1533)   [START ON 09/27/2020] thiamine injection     thiamine injection             Kathie Dike, MD [facility] 09/24/2020, 6:36 PM

## 2020-09-24 NOTE — Progress Notes (Signed)
ANTICOAGULATION CONSULT NOTE - Follow Up Consult  Pharmacy Consult for warfarin & heparin  Indication: atrial fibrillation  Allergies  Allergen Reactions   Prednisone Other (See Comments)    Makes skin crawl, rapid HR   Cortisone Palpitations    Patient Measurements: Height: 5\' 7"  (170.2 cm) Weight: 46.3 kg (102 lb 1.2 oz) IBW/kg (Calculated) : 66.1  Vital Signs: Temp: 97.4 F (36.3 C) (09/05 0800) Temp Source: Oral (09/05 0800) BP: 130/87 (09/05 0600) Pulse Rate: 91 (09/05 0600)  Labs: Recent Labs    09/22/20 0208 09/23/20 0259 09/23/20 1819 09/24/20 0418  HGB 12.6* 12.1*  --  13.5  HCT 39.3 38.0*  --  42.1  PLT 143* 147*  --  156  LABPROT 22.6* 17.0*  --  18.8*  INR 2.0* 1.4*  --  1.6*  HEPARINUNFRC 0.30  --  0.20* 0.30  CREATININE  --  0.50*  --  0.46*  CKTOTAL 26*  --   --   --      Estimated Creatinine Clearance: 49 mL/min (A) (by C-G formula based on SCr of 0.46 mg/dL (L)).   Medications:  Scheduled:   ascorbic acid  500 mg Oral Daily   chlorhexidine  15 mL Mouth Rinse BID   Chlorhexidine Gluconate Cloth  6 each Topical Q0600   cholecalciferol  2,000 Units Oral Daily   dextromethorphan-guaiFENesin  1 tablet Oral BID   diltiazem  30 mg Oral Q12H   feeding supplement  237 mL Oral BID BM   folic acid  1 mg Oral Daily   furosemide  20 mg Oral Daily   magic mouthwash  10 mL Oral TID   mouth rinse  15 mL Mouth Rinse q12n4p   mirtazapine  7.5 mg Oral QHS   multivitamin with minerals  1 tablet Oral Daily   omega-3 acid ethyl esters  1 g Oral Daily   Ensure Max Protein  11 oz Oral Daily   Warfarin - Pharmacist Dosing Inpatient   Does not apply q1600   Infusions:   heparin 850 Units/hr (09/24/20 0642)    Home warfarin dosing unclear on med rec.  [facility] was contacted to clarify dosing.  Current regimen is 5mg  daily PLUS 1mg  once weekly. Unable to tell me what day of the week patient is supposed to take the 1mg , but said it was started on  08/09/20 due to DDI with primidone.  Assessment: 74 yoM admitted on 8/28 with acute hypoxic and hypercarbic respite failure . PMH significant for chronic warfarin anticoagulation, AFib, s/p mitral valve annuloplasty and MAZE 2005.  Admit INR 10.9 and was given Vitamin K 2.5 mg PO. Pharmacy is consulted to resume warfarin dosing on 8/30.  Today, 09/24/2020: INR 1.6, subtherapeutic HL is 0.30, therapeutic  CBC:  Hgb 13.5 stable/wnl, Plt stable at 156  No bleeding or complications reported by RN.   DDI:  suspect that vitamin K will suppress INR for several days.  Note that patient was NOT taking primidone prior to admission.  Diet:  regular   Goal of Therapy:  Heparin level 0.3-0.7 INR 2-3 Monitor platelets by anticoagulation protocol: Yes   Plan:  Continue heparin 850 units/hr  Obtain confirmatory HL 8 hours after previous level Warfarin 5 mg PO x 1. Daily PT/INR, heparin level & CBC    Royetta Asal, PharmD, BCPS 09/24/2020 8:52 AM

## 2020-09-25 DIAGNOSIS — G250 Essential tremor: Secondary | ICD-10-CM | POA: Diagnosis not present

## 2020-09-25 DIAGNOSIS — E43 Unspecified severe protein-calorie malnutrition: Secondary | ICD-10-CM | POA: Diagnosis not present

## 2020-09-25 DIAGNOSIS — R627 Adult failure to thrive: Secondary | ICD-10-CM | POA: Diagnosis not present

## 2020-09-25 DIAGNOSIS — J9621 Acute and chronic respiratory failure with hypoxia: Secondary | ICD-10-CM | POA: Diagnosis not present

## 2020-09-25 LAB — RENAL FUNCTION PANEL
Albumin: 3.2 g/dL — ABNORMAL LOW (ref 3.5–5.0)
Anion gap: 8 (ref 5–15)
BUN: 18 mg/dL (ref 8–23)
CO2: 35 mmol/L — ABNORMAL HIGH (ref 22–32)
Calcium: 8.5 mg/dL — ABNORMAL LOW (ref 8.9–10.3)
Chloride: 94 mmol/L — ABNORMAL LOW (ref 98–111)
Creatinine, Ser: 0.46 mg/dL — ABNORMAL LOW (ref 0.61–1.24)
GFR, Estimated: >60 mL/min (ref >60)
Glucose, Bld: 110 mg/dL — ABNORMAL HIGH (ref 70–99)
Phosphorus: 3.7 mg/dL (ref 2.5–4.6)
Potassium: 3.7 mmol/L (ref 3.5–5.1)
Sodium: 137 mmol/L (ref 135–145)

## 2020-09-25 LAB — CBC
HCT: 41 % (ref 39.0–52.0)
Hemoglobin: 13.3 g/dL (ref 13.0–17.0)
MCH: 32 pg (ref 26.0–34.0)
MCHC: 32.4 g/dL (ref 30.0–36.0)
MCV: 98.8 fL (ref 80.0–100.0)
Platelets: 167 10*3/uL (ref 150–400)
RBC: 4.15 MIL/uL — ABNORMAL LOW (ref 4.22–5.81)
RDW: 14.7 % (ref 11.5–15.5)
WBC: 7.6 10*3/uL (ref 4.0–10.5)
nRBC: 0 % (ref 0.0–0.2)

## 2020-09-25 LAB — PROTIME-INR
INR: 1.7 — ABNORMAL HIGH (ref 0.8–1.2)
Prothrombin Time: 19.8 seconds — ABNORMAL HIGH (ref 11.4–15.2)

## 2020-09-25 LAB — PREALBUMIN: Prealbumin: 14.6 mg/dL — ABNORMAL LOW (ref 18–38)

## 2020-09-25 MED ORDER — WARFARIN SODIUM 5 MG PO TABS
5.0000 mg | ORAL_TABLET | Freq: Once | ORAL | Status: AC
  Administered 2020-09-25: 5 mg via ORAL
  Filled 2020-09-25: qty 1

## 2020-09-25 MED ORDER — SENNOSIDES-DOCUSATE SODIUM 8.6-50 MG PO TABS
1.0000 | ORAL_TABLET | Freq: Two times a day (BID) | ORAL | Status: DC
  Administered 2020-09-25 – 2020-09-29 (×7): 1 via ORAL
  Filled 2020-09-25 (×7): qty 1

## 2020-09-25 NOTE — Progress Notes (Signed)
Occupational Therapy Treatment Patient Details Name: Austin Davis MRN: [id number] DOB: 05/05/1940 Today's Date: 09/25/2020    History of present illness 80 year old male never smoker with OSA non-adherent to therapy, hx mitral valve repair,chronic combined heart failure, atrial fibrillation, tremors, and general weakness who presents with acute hypercarbic respiratory failure requiring BiPAP. CT Chest with no evidence of infection or other causes to contribute to his dyspnea. The etiology of his chronic respiratory failure is unclear as he does not have evidence of emphysema or stigmata of OHS. Also question his diagnosis of OSA as his sleep study commented on >200 apnea and hypopneas however this was a home sleep study so unable to confirm if true obstructive apneas.  He does have significant failure to thrive and general weakness that is concerning for hypoventilation secondary to severe deconditioning/neuro?muscular weakness.   OT comments  Patient with continued fatigue and reporting pain in tailbone and back "I've lost so much weight." Patient needing encouragement for mobilization and needing increased time. Mod A for trunk support to sit upright with patient wanting to lean back onto forearm. After prolonged seated rest at edge of bed patient agreeable to get to chair. Patient needing min x2 and hand held assist to take few steps to recliner. Patient set up with lunch which arrived during treatment.    Follow Up Recommendations  SNF    Equipment Recommendations  None recommended by OT       Precautions / Restrictions Precautions Precautions: Fall Precaution Comments: monitor sats       Mobility Bed Mobility Overal bed mobility: Needs Assistance Bed Mobility: Supine to Sit     Supine to sit: Mod assist;HOB elevated     General bed mobility comments: repeatedly stating "in a minute" needing encouragement to progress mobility. able to move legs with increased time and mod A  with use of bed pad to sit trunk upright    Transfers Overall transfer level: Needs assistance Equipment used: 2 person hand held assist Transfers: Sit to/from [facility] Sit to Stand: Min assist;+2 physical assistance;+2 safety/equipment Stand pivot transfers: Min assist;+2 physical assistance;+2 safety/equipment       General transfer comment: decreased activity tolerance, needs increased time for all mobility. min x2 to power up to standing and reliant on hand held assist x2 to take few steps to recliner    Balance Overall balance assessment: Needs assistance Sitting-balance support: Bilateral upper extremity supported Sitting balance-Leahy Scale: Poor Sitting balance - Comments: wanting to lean back on bed needing cues to redirect   Standing balance support: Bilateral upper extremity supported Standing balance-Leahy Scale: Poor Standing balance comment: reliant on external assist to get to recliner chair                           ADL either performed or assessed with clinical judgement   ADL Overall ADL's : Needs assistance/impaired Eating/Feeding: Set up;Sitting                       Toilet Transfer: Minimal assistance;+2 for physical assistance;+2 for safety/equipment;Stand-pivot Toilet Transfer Details (indicate cue type and reason): patient needing increased time sitting edge of bed "I need to get my energy" before trying to stand from edge of bed. patient needing bilateral hand held assist min x2 to take few steps to recliner         Functional mobility during ADLs: Minimal assistance;+2 for physical assistance;+2 for  safety/equipment General ADL Comments: limited activity tolerance      Cognition Arousal/Alertness: Awake/alert Behavior During Therapy: Flat affect Overall Cognitive Status: Within Functional Limits for tasks assessed                                                General Comments on 2L O2  O2 remaining in 90s when wave form accurate    Pertinent Vitals/ Pain       Pain Assessment: Faces Faces Pain Scale: Hurts little more Pain Location: buttock, back Pain Descriptors / Indicators: Sore Pain Intervention(s): Monitored during session                                                Frequency  Min 2X/week        Progress Toward Goals  OT Goals(current goals can now be found in the care plan section)  Progress towards OT goals: Not progressing toward goals - comment (decreased activity tolerance)  Acute Rehab OT Goals Patient Stated Goal: to get better OT Goal Formulation: With patient Time For Goal Achievement: 10/03/20 Potential to Achieve Goals: Good ADL Goals Pt Will Perform Lower Body Dressing: with modified independence;sit to/from stand Pt Will Transfer to Toilet: with modified independence;ambulating;regular height toilet Pt Will Perform Toileting - Clothing Manipulation and hygiene: with modified independence;sit to/from stand Additional ADL Goal #1: Patient will stand at sink to perform grooming task as evidence of improving activity tolerance  Plan Discharge plan remains appropriate                     AM-PAC OT "6 Clicks" Daily Activity     Outcome Measure   Help from another person eating meals?: A Little Help from another person taking care of personal grooming?: A Little Help from another person toileting, which includes using toliet, bedpan, or urinal?: Total Help from another person bathing (including washing, rinsing, drying)?: A Lot Help from another person to put on and taking off regular upper body clothing?: A Lot Help from another person to put on and taking off regular lower body clothing?: A Lot 6 Click Score: 13    End of Session Equipment Utilized During Treatment: Oxygen  OT Visit Diagnosis: Muscle weakness (generalized) (M62.81);Unsteadiness on feet (R26.81)   Activity Tolerance Patient limited by  fatigue   Patient Left in chair;with call bell/phone within reach;with chair alarm set   Nurse Communication Mobility status        Time: 3888-7579 OT Time Calculation (min): 23 min  Charges: OT General Charges $OT Visit: 1 Visit OT Treatments $Self Care/Home Management : 8-22 mins  Delbert Phenix OT OT pager: [place] 09/25/2020, 12:55 PM

## 2020-09-25 NOTE — TOC Progression Note (Signed)
Transition of Care [facility]) - Progression Note   Patient Details  Name: Austin Davis MRN: [id number] Date of Birth: Dec 04, 1940  [facility]) CM/SW Martha [place], LCSW Phone Number: 09/25/2020, 11:22 AM  Clinical Narrative: PT and OT evaluations recommended SNF. CSW spoke with patient's daughter to discuss recommendations. Daughter is agreeable to SNF. Patient is vaccinated and boosted x1 for COVID.  FL2 done and initial referral faxed out to facilities. CSW called Zack with [employer] and confirmed he can follow the patient to SNF and set up the Trilogy after discharging from rehab. Patient will need to discharge to SNF on Bipap. TOC awaiting bed offers.  Expected Discharge Plan: [place] Barriers to Discharge: Continued Medical Work up  Expected Discharge Plan and Services Expected Discharge Plan: [place] Discharge Planning Services: CM Consult Post Acute Care Choice: [place] arrangements for the past 2 months: Single Family Home  Readmission Risk Interventions No flowsheet data found.

## 2020-09-25 NOTE — Progress Notes (Signed)
Palliative Care  Progress Note  Patient is OOB in a chair today and appears to be improving compared to last week. His tremors have greatly improved and motably his hands do not appear to have the pill rolling tremor that was so obvious last week. He does have a jaw tremor, his voice is very weak, he has dyspnea at rest and looks to have respiratory muscle weakness specifically. He has a very dry mouth and continues to complain of a metallic taste impeding his appetite and PO intake. He is tolerating the mirtazapine at night and in general his appetite is improving and he is able to drink ensure for main supplementation. His respiratory status remains fragile-hypercarbic as the day progresses.  We re-discussed his goals of care today.  He is frustrated by no real answers about his condition -especially the respiratory fatigue -there is no clear evidence on Chest CT for advanced lung disease- medical team feels strongly that this is neuromuscular -extensive work-up pending and so far unrevealing. Discussed obtaining a neurology consult for additional recommendations. Patient has not been able to tolerate positioning for an MRI, his Head CT was unrevealing. Continue current dose of mirtazapine- ?is a short burst of steroids would help him with dysgusia and to stimulate appetite. Will discuss trial with medical team. ANA pending, sed rate was normal. Mr. Polo specifically asks me about getting his HCPOA paperwork completed-he also has a will that he needs to get notarized and I strongly sense that he is wanting to get his affairs in order. If his condition cant get better and the best outcome he can get is having to be in a nursing home and dependent on others for his care, then he would likely elect for a comfort care route and may refuse interventions like the bipap etc.. Will work with Spiritual care to help get his HCPOA signed. We completed a MOST form last week. He needs a bowel regimen based on CT  with significant stool volume-will start Senna-S BID for stimulant laxative.   Lane Hacker, DO Palliative Medicine Time: 35 min Greater than 50%  of this time was spent counseling and coordinating care related to the above assessment and plan.

## 2020-09-25 NOTE — Progress Notes (Signed)
PROGRESS NOTE    Austin Davis  DJM:[id number] DOB: October 28, 1940 DOA: 09/16/2020 PCP: [name], MD    Brief Narrative:  80 year old male with a history of paroxysmal atrial fibrillation on anticoagulation, essential tremor, obstructive sleep apnea on CPAP, who has had a progressive decline in his health, weight loss over the last year due to decreased p.o. intake, presented with worsening shortness of breath.  He was noted to be tachycardic, hypotensive and hypoxic on arrival.  Initial ABG showed respiratory acidosis with a PCO2 of 90.  He was started on BiPAP.  INR was noted to be 10.9 and he was given vitamin K in the emergency room.  Chest x-ray did not show any evidence of infiltrates.   Assessment & Plan:   Principal Problem:   Acute on chronic respiratory failure with hypoxia and hypercapnia (HCC) Active Problems:   HLD (hyperlipidemia)   Status post mitral valve annuloplasty and MAZE 2005   Essential tremor   Failure to thrive in adult   AF (paroxysmal atrial fibrillation) (HCC)   Stage 1 skin ulcer of sacral region ([place])   Protein-calorie malnutrition, severe   Unintentional weight loss   Acute metabolic encephalopathy   SVT (supraventricular tachycardia) (HCC)   Acute on chronic respiratory failure with hypoxia and hypercapnia -Patient has a reported history of obstructive sleep apnea and was recently set up with CPAP in 07/2020.  He reportedly had difficulty tolerating this and was using it infrequently -No significant findings on chest x-ray on admission -He was found to have significant respiratory acidosis and was placed on BiPAP -Seen by pulmonology with recommendations for home trilogy noninvasive ventilator -He will also need repeat outpatient sleep study as well as PFTs -Suspicion is that his hypoventilation may be due to progressive severe weakness from malnutrition -He should continue on BiPAP nightly while in the hospital   Acute on Chronic  combined CHF -Ejection fraction 45 to 50% with grade 1 diastolic dysfunction -He is noted to have elevated BNP at 876 and evidence of pleural effusions on CT imaging -His malnutrition and low protein state likely contributing to anasarca -He does have significant orthopnea -Started on IV Lasix -Monitor intake and output  Failure to thrive/generalized weakness/severe malnutrition -Etiology for his progressive failure to thrive is not entirely clear at this point -He has had CT chest, abdomen and pelvis that did not show any obvious underlying malignancy -Cortisol noted to be normal, he is also followed with an endocrinologist as an outpatient and had PET scan that was unrevealing -CK not significantly elevated -ESR is not elevated -B12 levels normal -There is concern that he may have an underlying neurodegenerative condition, since he does have a tremor, dysphagia, generalized weakness, and often does appear to be confused -He does not appear to have any cranial nerve deficits at this time and his strength is relatively equal bilaterally.  Does not appear to have increased tone. -CT head was unrevealing indicating mild atrophy and small vessel disease. -I feel it will be helpful if patient can undergo MRI brain for further characterization -Unfortunately, due to his current orthopnea do not feel that he be able to lay flat for an extended period of time to undergo MRI -I am hopeful that once he is able to diurese adequately, this may improve his ability to lay flat -Once we have further neuroimaging, could potentially discuss with neurology for further work-up -He may have nutritional deficiencies that are contributing to his overall decline -He has been started  on high-dose thiamine replacement to see if this helps with his overall confusion.  Dysgeusia -Patient reports that he has not been eating and drinking well for the past year due to significant metallic taste when he eats -His  medications have been reviewed and does not appear to be contributing -B12 levels were normal -He was started on PPI for any element of GERD that may be contributing -Zinc level has been ordered and he has been started on supplementation -Heavy metal screen has also been ordered  Dysphagia -Speech therapy following -We will need modified barium swallow once he is able to tolerate it -Although I suspect his dysphagia is from general deconditioning weakness  Paroxysmal atrial fibrillation SVT -Overall heart rates have been stable -He has been started on low-dose Cardizem -He is anticoagulated with Coumadin -Follow-up with cardiology as an outpatient  Essential tremor, chronic -He is followed by [place] [facility] -Intolerant of beta-blockers in the past -Uses benzos as needed  Severe protein calorie malnutrition -BMI 16.2 -Nutrition following  Constipation -Large stool burden noted on CT scan -Patient had large bowel movement yesterday  Goals of care -Appreciate assistance from palliative care -CODE STATUS has been adjusted -Seen by physical therapy with recommendations for skilled nursing facility placement  DVT prophylaxis: SCDs Start: 09/16/20 1920  Code Status: Partial code, no intubation, no CPR, no defibrillation Family Communication: Updated patient's daughter Disposition Plan: Status is: Inpatient  Remains inpatient appropriate because:Ongoing diagnostic testing needed not appropriate for outpatient work up and Inpatient level of care appropriate due to severity of illness  Dispo: The patient is from: Home              Anticipated d/c is to: SNF              Patient currently is not medically stable to d/c.   Difficult to place patient No         Consultants:  Palliative care Pulmonology  Procedures:    Antimicrobials:      Subjective: Staff reports that patient had a large bowel movement yesterday.  Feels that breathing is similar to  yesterday.  No chest pain.  No cough.  Still feels weak.  Objective: Vitals:   09/25/20 1700 09/25/20 1800 09/25/20 1900 09/25/20 2000  BP: 126/81 (!) 142/105 102/68 119/75  Pulse: 92 (!) 109 96 96  Resp: (!) 25 (!) 48 (!) 24 (!) 35  Temp:      TempSrc:      SpO2: 100% 96% (!) 87% 100%  Weight:      Height:        Intake/Output Summary (Last 24 hours) at 09/25/2020 2101 Last data filed at 09/25/2020 1800 Gross per 24 hour  Intake 700 ml  Output 1975 ml  Net -1275 ml   Filed Weights   09/16/20 1450 09/21/20 1439 09/25/20 0601  Weight: 45.4 kg 46.3 kg 47.1 kg    Examination:  General exam: Appears calm and comfortable  Respiratory system: Clear to auscultation. Respiratory effort normal. Cardiovascular system: S1 & S2 heard, RRR. No JVD, murmurs, rubs, gallops or clicks. No pedal edema. Gastrointestinal system: Abdomen is nondistended, soft and nontender. No organomegaly or masses felt. Normal bowel sounds heard. Central nervous system: Alert and oriented. No focal neurological deficits. Tremor in right hand and jaw Extremities: Symmetric 4 x 5 power bilaterally. Skin: No rashes, lesions or ulcers Psychiatry: Judgement and insight appear normal. Mood & affect appropriate.     Data Reviewed: I have personally reviewed following  labs and imaging studies  CBC: Recent Labs  Lab 09/19/20 0234 09/20/20 0348 09/21/20 0242 09/22/20 0208 09/23/20 0259 09/24/20 0418 09/25/20 0241  WBC 8.2   < > 7.6 5.4 5.4 7.0 7.6  NEUTROABS 6.7  --   --   --   --   --   --   HGB 13.5   < > 15.2 12.6* 12.1* 13.5 13.3  HCT 42.0   < > 48.0 39.3 38.0* 42.1 41.0  MCV 99.3   < > 101.5* 99.7 100.5* 99.3 98.8  PLT 108*   < > 159 143* 147* 156 167   < > = values in this interval not displayed.   Basic Metabolic Panel: Recent Labs  Lab 09/20/20 0348 09/21/20 0242 09/23/20 0259 09/24/20 0418 09/25/20 0241  NA 137 141 138 139 137  K 4.0 3.8 4.1 3.7 3.7  CL 94* 92* 91* 93* 94*  CO2 32 42*  37* 39* 35*  GLUCOSE 126* 127* 91 116* 110*  BUN 16 30* 23 19 18   CREATININE 0.61 0.80 0.50* 0.46* 0.46*  CALCIUM 9.0 9.1 8.5* 8.7* 8.5*  MG 2.0  --   --   --   --   PHOS 2.2*  --  2.0* 2.2* 3.7   GFR: Estimated Creatinine Clearance: 49.9 mL/min (A) (by C-G formula based on SCr of 0.46 mg/dL (L)). Liver Function Tests: Recent Labs  Lab 09/20/20 0348 09/23/20 0259 09/24/20 0418 09/25/20 0241  AST 23  --   --   --   ALT 31  --   --   --   ALKPHOS 43  --   --   --   BILITOT 1.5*  --   --   --   PROT 5.6*  --   --   --   ALBUMIN 3.5 3.1* 3.2* 3.2*   No results for input(s): LIPASE, AMYLASE in the last 168 hours. No results for input(s): AMMONIA in the last 168 hours. Coagulation Profile: Recent Labs  Lab 09/21/20 0242 09/22/20 0208 09/23/20 0259 09/24/20 0418 09/25/20 0241  INR 1.4* 2.0* 1.4* 1.6* 1.7*   Cardiac Enzymes: Recent Labs  Lab 09/22/20 0208  CKTOTAL 26*   BNP (last 3 results) No results for input(s): PROBNP in the last 8760 hours. HbA1C: No results for input(s): HGBA1C in the last 72 hours. CBG: No results for input(s): GLUCAP in the last 168 hours. Lipid Profile: No results for input(s): CHOL, HDL, LDLCALC, TRIG, CHOLHDL, LDLDIRECT in the last 72 hours. Thyroid Function Tests: No results for input(s): TSH, T4TOTAL, FREET4, T3FREE, THYROIDAB in the last 72 hours. Anemia Panel: No results for input(s): VITAMINB12, FOLATE, FERRITIN, TIBC, IRON, RETICCTPCT in the last 72 hours. Sepsis Labs: No results for input(s): PROCALCITON, LATICACIDVEN in the last 168 hours.  Recent Results (from the past 240 hour(s))  Resp Panel by RT-PCR (Flu A&B, Covid) Nasopharyngeal Swab     Status: None   Collection Time: 09/16/20  3:22 PM   Specimen: Nasopharyngeal Swab; Nasopharyngeal(NP) swabs in vial transport medium  Result Value Ref Range Status   SARS Coronavirus 2 by RT PCR NEGATIVE NEGATIVE Final    Comment: (NOTE) SARS-CoV-2 target nucleic acids are NOT  DETECTED.  The SARS-CoV-2 RNA is generally detectable in upper respiratory specimens during the acute phase of infection. The lowest concentration of SARS-CoV-2 viral copies this assay can detect is 138 copies/mL. A negative result does not preclude SARS-Cov-2 infection and should not be used as the sole basis for treatment  or other patient management decisions. A negative result may occur with  improper specimen collection/handling, submission of specimen other than nasopharyngeal swab, presence of viral mutation(s) within the areas targeted by this assay, and inadequate number of viral copies(<138 copies/mL). A negative result must be combined with clinical observations, patient history, and epidemiological information. The expected result is Negative.  Fact Sheet for Patients:  EntrepreneurPulse.com.au  Fact Sheet for Healthcare Providers:  IncredibleEmployment.be  This test is no t yet approved or cleared by the [place] FDA and  has been authorized for detection and/or diagnosis of SARS-CoV-2 by FDA under an Emergency Use Authorization (EUA). This EUA will remain  in effect (meaning this test can be used) for the duration of the COVID-19 declaration under Section 564(b)(1) of the Act, 21 U.S.C.section 360bbb-3(b)(1), unless the authorization is terminated  or revoked sooner.       Influenza A by PCR NEGATIVE NEGATIVE Final   Influenza B by PCR NEGATIVE NEGATIVE Final    Comment: (NOTE) The Xpert Xpress SARS-CoV-2/FLU/RSV plus assay is intended as an aid in the diagnosis of influenza from Nasopharyngeal swab specimens and should not be used as a sole basis for treatment. Nasal washings and aspirates are unacceptable for Xpert Xpress SARS-CoV-2/FLU/RSV testing.  Fact Sheet for Patients: EntrepreneurPulse.com.au  Fact Sheet for Healthcare Providers: IncredibleEmployment.be  This test is not yet  approved or cleared by the [place] FDA and has been authorized for detection and/or diagnosis of SARS-CoV-2 by FDA under an Emergency Use Authorization (EUA). This EUA will remain in effect (meaning this test can be used) for the duration of the COVID-19 declaration under Section 564(b)(1) of the Act, 21 U.S.C. section 360bbb-3(b)(1), unless the authorization is terminated or revoked.  Performed at [facility], [place] [address] [place], [place] [address]   MRSA Next Gen by PCR, Nasal     Status: None   Collection Time: 09/16/20  8:23 PM   Specimen: Nasal Mucosa; Nasal Swab  Result Value Ref Range Status   MRSA by PCR Next Gen NOT DETECTED NOT DETECTED Final    Comment: (NOTE) The GeneXpert MRSA Assay (FDA approved for NASAL specimens only), is one component of a comprehensive MRSA colonization surveillance program. It is not intended to diagnose MRSA infection nor to guide or monitor treatment for MRSA infections. Test performance is not FDA approved in patients less than 31 years old. Performed at [facility], [place] [address], [place], [place] [address]          Radiology Studies: No results found.      Scheduled Meds:  ascorbic acid  500 mg Oral Daily   chlorhexidine  15 mL Mouth Rinse BID   Chlorhexidine Gluconate Cloth  6 each Topical Q0600   cholecalciferol  2,000 Units Oral Daily   dextromethorphan-guaiFENesin  1 tablet Oral BID   diltiazem  30 mg Oral Q12H   feeding supplement  237 mL Oral BID BM   folic acid  1 mg Oral Daily   furosemide  20 mg Intravenous Daily   magic mouthwash  10 mL Oral TID   mouth rinse  15 mL Mouth Rinse q12n4p   mirtazapine  7.5 mg Oral QHS   multivitamin with minerals  1 tablet Oral Daily   omega-3 acid ethyl esters  1 g Oral Daily   pantoprazole (PROTONIX) IV  40 mg Intravenous Q12H   Ensure Max Protein  11 oz Oral Daily   senna-docusate  1 tablet Oral  BID   Warfarin - Pharmacist  Dosing Inpatient   Does not apply q1600   zinc sulfate  220 mg Oral BID   Continuous Infusions:  [START ON 09/27/2020] thiamine injection     thiamine injection Stopped (09/25/20 1929)     LOS: 9 days    Time spent: 55mns    JKathie Dike MD [facility]   If 7PM-7AM, please contact night-coverage [email or web address]  09/25/2020, 9:01 PM

## 2020-09-25 NOTE — Progress Notes (Signed)
Physical Therapy Treatment Patient Details Name: Austin Davis MRN: [id number] DOB: 09-28-1940 Today's Date: 09/25/2020    History of Present Illness 80 year old male never smoker with OSA non-adherent to therapy, hx mitral valve repair,chronic combined heart failure, atrial fibrillation, tremors, and general weakness who presents with acute hypercarbic respiratory failure requiring BiPAP. CT Chest with no evidence of infection or other causes to contribute to his dyspnea. The etiology of his chronic respiratory failure is unclear as he does not have evidence of emphysema or stigmata of OHS. Also question his diagnosis of OSA as his sleep study commented on >200 apnea and hypopneas however this was a home sleep study so unable to confirm if true obstructive apneas.  He does have significant failure to thrive and general weakness that is concerning for hypoventilation secondary to severe deconditioning/neuro?muscular weakness.    PT Comments    Patient required much encouragement to participate, dozing off and on, stating  'I want to nap."  Patient assisted to sitting on bed edge. Gradually stated'" I can get in the chair for awhile."  +2 hand hold to stand up and shuffle to recliner. Patient very frail. HR 101, On 2 l [place], 955.   Follow Up Recommendations  SNF     Equipment Recommendations  none  Recommendations for Other Services       Precautions / Restrictions Precautions Precautions: Fall Precaution Comments: monitor sats    Mobility  Bed Mobility Overal bed mobility: Needs Assistance Bed Mobility: Supine to Sit     Supine to sit: Mod assist;HOB elevated     General bed mobility comments: repeatedly stating "in a minute" needing encouragement to progress mobility. able to move legs with increased time and mod A with use of bed pad to sit trunk upright    Transfers Overall transfer level: Needs assistance Equipment used: 2 person hand held assist Transfers: Sit to/from  [facility] Sit to Stand: Min assist;+2 physical assistance;+2 safety/equipment Stand pivot transfers: Min assist;+2 physical assistance;+2 safety/equipment       General transfer comment: decreased activity tolerance, needs increased time for all mobility. min x2 to power up to standing and reliant on hand held assist x2 to take few steps to recliner  Ambulation/Gait                 Stairs             Wheelchair Mobility    Modified Rankin (Stroke Patients Only)       Balance Overall balance assessment: Needs assistance Sitting-balance support: Bilateral upper extremity supported Sitting balance-Leahy Scale: Poor Sitting balance - Comments: wanting to lean back on bed needing cues to redirect   Standing balance support: Bilateral upper extremity supported Standing balance-Leahy Scale: Poor Standing balance comment: reliant on external assist to get to recliner chair                            Cognition Arousal/Alertness: Lethargic Behavior During Therapy: Flat affect Overall Cognitive Status: Impaired/Different from baseline Area of Impairment: Attention                               General Comments: initially required much encouragement to participate, dozes in and out. stating" I want to nap."      Exercises      General Comments General comments (skin integrity, edema, etc.): on 2L O2  O2 remaining in 90s when wave form accurate      Pertinent Vitals/Pain Pain Assessment: Faces Faces Pain Scale: Hurts little more Pain Location: buttock, back Pain Descriptors / Indicators: Sore Pain Intervention(s): Monitored during session;Premedicated before session;Repositioned    Home Living                      Prior Function            PT Goals (current goals can now be found in the care plan section) Acute Rehab PT Goals Patient Stated Goal: to get better Progress towards PT goals: Progressing  toward goals    Frequency    Min 2X/week      PT Plan Current plan remains appropriate;Frequency needs to be updated    Co-evaluation PT/OT/SLP Co-Evaluation/Treatment: Yes Reason for Co-Treatment: For patient/therapist safety;Complexity of the patient's impairments (multi-system involvement) PT goals addressed during session: Mobility/safety with mobility        AM-PAC PT "6 Clicks" Mobility   Outcome Measure  Help needed turning from your back to your side while in a flat bed without using bedrails?: A Lot Help needed moving from lying on your back to sitting on the side of a flat bed without using bedrails?: A Lot Help needed moving to and from a bed to a chair (including a wheelchair)?: A Lot Help needed standing up from a chair using your arms (e.g., wheelchair or bedside chair)?: Total Help needed to walk in hospital room?: Total Help needed climbing 3-5 steps with a railing? : Total 6 Click Score: 9    End of Session Equipment Utilized During Treatment: Oxygen Activity Tolerance: Patient limited by fatigue Patient left: in chair;with call bell/phone within reach;with chair alarm set Nurse Communication: Mobility status PT Visit Diagnosis: Unsteadiness on feet (R26.81);Muscle weakness (generalized) (M62.81);Difficulty in walking, not elsewhere classified (R26.2)     Time: 0938-1829 PT Time Calculation (min) (ACUTE ONLY): 23 min  Charges:  $Therapeutic Activity: 8-22 mins                     [name] PT Acute Rehabilitation Services Pager [phone] Office [phone]    Claretha Cooper 09/25/2020, 2:59 PM

## 2020-09-25 NOTE — Progress Notes (Signed)
Patient continues to decline nocturnal BiPAP. He states that he is "somewhat claustrophobic and I feel entrapped" and expresses concerns that he feels his breathing becomes increasingly more difficult when wearing the mask. Education provided. RN made aware.

## 2020-09-25 NOTE — Progress Notes (Signed)
Assumed care of patient from ICU RN @ 2215 This RN agrees with assessment except where additional documentation was made specifically skin and respiratory status, pt tachypnea showing DOE and not able to speak in complete sentences without getting winded. RN will have respiratory ask pt about BiPAP again

## 2020-09-25 NOTE — Progress Notes (Signed)
ANTICOAGULATION CONSULT NOTE - Follow Up Consult  Pharmacy Consult for warfarin & heparin  Indication: atrial fibrillation  Allergies  Allergen Reactions   Prednisone Other (See Comments)    Makes skin crawl, rapid HR   Cortisone Palpitations   Patient Measurements: Height: 5\' 7"  (170.2 cm) Weight: 47.1 kg (103 lb 13.4 oz) IBW/kg (Calculated) : 66.1  Vital Signs: Temp: 97.5 F (36.4 C) (09/06 0030) Temp Source: Oral (09/06 0030) BP: 139/84 (09/06 0600) Pulse Rate: 95 (09/06 0600)  Labs: Recent Labs    09/23/20 0259 09/23/20 1819 09/24/20 0418 09/25/20 0241  HGB 12.1*  --  13.5 13.3  HCT 38.0*  --  42.1 41.0  PLT 147*  --  156 167  LABPROT 17.0*  --  18.8* 19.8*  INR 1.4*  --  1.6* 1.7*  HEPARINUNFRC  --  0.20* 0.30  --   CREATININE 0.50*  --  0.46* 0.46*   Estimated Creatinine Clearance: 49.9 mL/min (A) (by C-G formula based on SCr of 0.46 mg/dL (L)).  Home warfarin dosing unclear on med rec.  [facility] was contacted to clarify dosing.  Current regimen is 5mg  daily PLUS 1mg  once weekly. Unable to tell me what day of the week patient is supposed to take the 1mg , but said it was started on 08/09/20 due to DDI with primidone.  Assessment: 38 yoM admitted on 8/28 with acute hypoxic and hypercarbic respite failure . PMH significant for chronic warfarin anticoagulation, AFib, s/p mitral valve annuloplasty and MAZE 2005.  Admit INR 10.9 and was given Vitamin K 2.5 mg PO x1 8/28 Pharmacy is consulted to resume warfarin dosing on 8/30.  Heparin stopped 9/5 at 10am for blood noted in urine, continue Warfarin  Today, 09/25/2020: INR 1.7, subtherapeutic CBC:  Hgb stable, Plt stable at 167  No bleeding or complications reported by RN.   DDI:  suspect that vitamin K will suppress INR for several days.  Note that patient was NOT taking primidone prior to admission.  Diet:  regular, charted ~ 30% intake, but taking supplements  Goal of Therapy:  INR 2-3 Monitor  platelets by anticoagulation protocol: Yes   Plan:  Warfarin 5 mg PO x 1 at 1600 Daily PT/INR  Minda Ditto PharmD [facility] [phone] 09/25/2020, 7:58 AM

## 2020-09-25 NOTE — Progress Notes (Signed)
  Speech Language Pathology Treatment: Dysphagia  Patient Details Name: Austin Davis MRN: [id number] DOB: [birth year] Today's Date: 09/25/2020 Time: 6063-0160 SLP Time Calculation (min) (ACUTE ONLY): 35 min  Assessment / Plan / Recommendation Clinical Impression  Pt today seen for dysphagia goals.  Upon interview, pt admits to dysphagia - to food and drink without expanding on information.   Reports this has been new over the last few weeks.  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  He also endorses some dyspnea that negatively impacts his po intake.  Reviewed with pt recommendation to monitor his work of breathing and modify intake based on respiratory status. Encouraged liquid intake including Ensure for max efficiency given pt becomes dyspnea with minimal exertion - even talking.    Observed pt consuming Ensure *cold* and room temperature water (pt prefers to drink from bottle*.  No indication of aspiration with minimal po that the pt would accept however pt remains at risk due to his work of breathing.   Advised pt follow aspiration precautions for dysphagia and aspiration mitigation. Advised pt to attempt to exhale after swallow to aid airway clearance using teach back.   At this time, given potential ileus/obstruction of bowel, do not recommend MBS.  Will continue to follow briefly for dysphagia management.        HPI HPI: 80 y.o. male; SLP reconsulted due to report to MD of occasional coughing between sips. Pt was seen by SLP at bedside on 8/29 without subjective finding of dysphagia. Pt did report reflux and poor appetite, metallic taste to food, weight loss over 2 months. Pt with medical history significant for paroxysmal atrial fibrillation, mitral valve annuloplasty , PAD, history of GI bleed, essential tremor, OSA not able to tolerate CPAP and hyperlipidemia, progressive health declineand weight loss. Presented  09/16/2020 with increasing shortness of breath, tachycardic and slightly hypotensive and hypoxic with oxygen saturations below 90.  On initial ABG, he was acidotic with pH of 7.227 with PCO2 of 90.  He was started on BiPAP.  Chest x-ray was negative for infiltrates.  Pt imaging Abdomen "Large volume stool within the rectal vault. Clinical correlation for  fecal impaction may be helpful. Superimposed moderate stool  throughout the colon without evidence of obstruction.".  Also chest imaging showed pleural effusion.  SLP follow up to assess po tolerance, indication for instrumental swallow evaluation.      SLP Plan  Continue with current plan of care       Recommendations  Diet recommendations: Regular;Thin liquid Liquids provided via: Cup Medication Administration: Whole meds with liquid Supervision: Patient able to self feed Compensations: Minimize environmental distractions;Slow rate;Small sips/bites Postural Changes and/or Swallow Maneuvers: Seated upright 90 degrees;Upright 30-60 min after meal                Oral Care Recommendations: Oral care BID Follow up Recommendations: None SLP Visit Diagnosis: Dysphagia, unspecified (R13.10) Plan: Continue with current plan of care       GO               Kathleen Lime, MS [place] Office [phone] Pager [phone]  Macario Golds 09/25/2020, 12:27 PM

## 2020-09-25 NOTE — NC FL2 (Signed)
[place] MEDICAID FL2 LEVEL OF CARE SCREENING TOOL     IDENTIFICATION  Patient Name: Austin Davis Birthdate: 05-18-1940 Sex: male Admission Date (Current Location): 09/16/2020  [facility] and [place] Number:  Herbalist and Address:  [facility],  [place] [address] [place]      Provider Number: [id number]  Attending Physician Name and Address:  Kathie Dike, MD  Relative Name and Phone Number:  Trinidad Petron (daughter) Ph: [phone]    Current Level of Care: Hospital Recommended Level of Care: [place] Prior Approval Number:    Date Approved/Denied:   PASRR Number: [id number]  Discharge Plan: SNF    Current Diagnoses: Patient Active Problem List   Diagnosis Date Noted   Unintentional weight loss 56/81/2751   Acute metabolic encephalopathy 70/01/7492   SVT (supraventricular tachycardia) ([place]) 09/20/2020   Protein-calorie malnutrition, severe 09/17/2020   Acute on chronic respiratory failure with hypoxia and hypercapnia ([place]) 09/16/2020   Failure to thrive in adult 09/16/2020   AF (paroxysmal atrial fibrillation) ([place]) 09/16/2020   Stage 1 skin ulcer of sacral region ([place]) 09/16/2020   Hypotension 06/30/2020   GIB (gastrointestinal bleeding) 02/17/2017   Rectal bleeding 02/16/2017   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   Essential tremor 02/19/2015   Status post mitral valve annuloplasty and MAZE 2005 12/12/2014   HLD (hyperlipidemia) 05/05/2013   DOE (dyspnea on exertion) 04/22/2013   Fatigue 04/22/2013   Atrial fibrillation ([place]) 05/08/2012    Orientation RESPIRATION BLADDER Height & Weight     Self, Time, Situation, Place  Normal Incontinent Weight: 103 lb 13.4 oz (47.1 kg) Height:  5\' 7"  (170.2 cm)  BEHAVIORAL SYMPTOMS/MOOD NEUROLOGICAL BOWEL NUTRITION STATUS      Incontinent Diet (regular)  AMBULATORY STATUS COMMUNICATION OF NEEDS Skin   Extensive Assist Verbally Other (Comment)  (Ecchymosis: bilateral arms, hips; cracking: bilateral feet)                       Personal Care Assistance Level of Assistance  Bathing, Feeding, Dressing Bathing Assistance: Limited assistance Feeding assistance: Limited assistance Dressing Assistance: Limited assistance     Functional Limitations Info  Sight, Hearing, Speech Sight Info: Adequate Hearing Info: Adequate Speech Info: Adequate    SPECIAL CARE FACTORS FREQUENCY  PT (By licensed PT), OT (By licensed OT)     PT Frequency: 5 x weekly OT Frequency: 5 x weekly            Contractures Contractures Info: Not present    Additional Factors Info  Code Status, Allergies, Psychotropic Code Status Info: Full Allergies Info: Prednisone, Cortisone Psychotropic Info: Xanax, Remeron         Current Medications (09/25/2020):  This is the current hospital active medication list Current Facility-Administered Medications  Medication Dose Route Frequency Provider Last Rate Last Admin   acetaminophen (TYLENOL) tablet 500 mg  500 mg Oral Q6H PRN Tu, Ching T, DO   500 mg at 09/25/20 1018   albuterol (PROVENTIL) (2.5 MG/3ML) 0.083% nebulizer solution 2.5 mg  2.5 mg Nebulization Q2H PRN Tu, Ching T, DO       ALPRAZolam Duanne Moron) tablet 0.125 mg  0.125 mg Oral QHS PRN Tu, Ching T, DO   0.125 mg at 09/20/20 2254   ascorbic acid (VITAMIN C) tablet 500 mg  500 mg Oral Daily Tu, Ching T, DO   500 mg at 09/25/20 1019   chlorhexidine (PERIDEX) 0.12 % solution 15 mL  15  mL Mouth Rinse BID Tu, Ching T, DO   15 mL at 09/25/20 1017   Chlorhexidine Gluconate Cloth 2 % PADS 6 each  6 each Topical Q0600 Tu, Ching T, DO   6 each at 09/25/20 0557   cholecalciferol (VITAMIN D) tablet 2,000 Units  2,000 Units Oral Daily Tu, Ching T, DO   2,000 Units at 09/25/20 1019   dextromethorphan-guaiFENesin (MUCINEX DM) 30-600 MG per 12 hr tablet 1 tablet  1 tablet Oral BID Aline August, MD   1 tablet at 09/25/20 1019   diltiazem (CARDIZEM) injection 10  mg  10 mg Intravenous Q6H PRN Kathie Dike, MD       diltiazem (CARDIZEM) tablet 30 mg  30 mg Oral Q12H Kathie Dike, MD   30 mg at 09/25/20 1018   feeding supplement (ENSURE ENLIVE / ENSURE PLUS) liquid 237 mL  237 mL Oral BID BM Alekh, Kshitiz, MD   237 mL at 98/26/41 5830   folic acid (FOLVITE) tablet 1 mg  1 mg Oral Daily Alekh, Kshitiz, MD   1 mg at 09/25/20 1018   furosemide (LASIX) injection 20 mg  20 mg Intravenous Daily Kathie Dike, MD   20 mg at 09/25/20 1017   magic mouthwash  10 mL Oral TID Lane Hacker L, DO   10 mL at 09/25/20 1018   MEDLINE mouth rinse  15 mL Mouth Rinse q12n4p Tu, Ching T, DO   15 mL at 09/24/20 1514   mirtazapine (REMERON) tablet 7.5 mg  7.5 mg Oral QHS Lane Hacker L, DO   7.5 mg at 09/24/20 2245   multivitamin with minerals tablet 1 tablet  1 tablet Oral Daily Aline August, MD   1 tablet at 09/25/20 1019   omega-3 acid ethyl esters (LOVAZA) capsule 1 g  1 g Oral Daily Tu, Ching T, DO   1 g at 09/25/20 1029   pantoprazole (PROTONIX) injection 40 mg  40 mg Intravenous Q12H Kathie Dike, MD   40 mg at 09/25/20 1017   protein supplement (ENSURE MAX) liquid  11 oz Oral Daily Lane Hacker L, DO   11 oz at 09/25/20 1019   sodium chloride (OCEAN) 0.65 % nasal spray 1 spray  1 spray Each Nare PRN Kathie Dike, MD       [START ON 09/27/2020] thiamine (B-1) 250 mg in sodium chloride 0.9 % 50 mL IVPB  250 mg Intravenous Daily Thomes Lolling, RPH       thiamine 500mg  in normal saline (25ml) IVPB  500 mg Intravenous Q8H Kathie Dike, MD   Stopped at 09/25/20 1056   Warfarin - Pharmacist Dosing Inpatient   Does not apply q1600 Aline August, MD   Given at 09/18/20 1615   zinc sulfate capsule 220 mg  220 mg Oral BID Kathie Dike, MD   220 mg at 09/25/20 1020     Discharge Medications: Please see discharge summary for a list of discharge medications.  Relevant Imaging Results:  Relevant Lab Results:   Additional  Information SSN: [id number]  Sherie Don, LCSW

## 2020-09-26 DIAGNOSIS — J9622 Acute and chronic respiratory failure with hypercapnia: Secondary | ICD-10-CM | POA: Diagnosis not present

## 2020-09-26 DIAGNOSIS — J9621 Acute and chronic respiratory failure with hypoxia: Secondary | ICD-10-CM | POA: Diagnosis not present

## 2020-09-26 LAB — CBC
HCT: 41.8 % (ref 39.0–52.0)
Hemoglobin: 13.5 g/dL (ref 13.0–17.0)
MCH: 32.2 pg (ref 26.0–34.0)
MCHC: 32.3 g/dL (ref 30.0–36.0)
MCV: 99.8 fL (ref 80.0–100.0)
Platelets: 191 10*3/uL (ref 150–400)
RBC: 4.19 MIL/uL — ABNORMAL LOW (ref 4.22–5.81)
RDW: 15.3 % (ref 11.5–15.5)
WBC: 8.3 10*3/uL (ref 4.0–10.5)
nRBC: 0 % (ref 0.0–0.2)

## 2020-09-26 LAB — ZINC: Zinc: 124 ug/dL — ABNORMAL HIGH (ref 44–115)

## 2020-09-26 LAB — BASIC METABOLIC PANEL
Anion gap: 6 (ref 5–15)
BUN: 18 mg/dL (ref 8–23)
CO2: 40 mmol/L — ABNORMAL HIGH (ref 22–32)
Calcium: 8.7 mg/dL — ABNORMAL LOW (ref 8.9–10.3)
Chloride: 92 mmol/L — ABNORMAL LOW (ref 98–111)
Creatinine, Ser: 0.54 mg/dL — ABNORMAL LOW (ref 0.61–1.24)
GFR, Estimated: >60 mL/min (ref >60)
Glucose, Bld: 104 mg/dL — ABNORMAL HIGH (ref 70–99)
Potassium: 3.9 mmol/L (ref 3.5–5.1)
Sodium: 138 mmol/L (ref 135–145)

## 2020-09-26 LAB — ACETYLCHOLINE RECEPTOR, BINDING: Acety choline binding ab: <0.03 nmol/L (ref 0.00–0.24)

## 2020-09-26 LAB — PROTIME-INR
INR: 2.1 — ABNORMAL HIGH (ref 0.8–1.2)
Prothrombin Time: 23.4 seconds — ABNORMAL HIGH (ref 11.4–15.2)

## 2020-09-26 MED ORDER — WARFARIN SODIUM 5 MG PO TABS
5.0000 mg | ORAL_TABLET | Freq: Once | ORAL | Status: AC
  Administered 2020-09-26: 5 mg via ORAL
  Filled 2020-09-26: qty 1

## 2020-09-26 NOTE — Progress Notes (Addendum)
BiPAP offered again by respiratory once pt was settled in bed, this was around 2330  Pt again refused BiPAP.  0500 pt requested tylenol and made mention of being on BiPAP.  RN explained the events of the night, letting him know he refused the BiPAP twice.  RN explained that we cannot force him to wear it once he states he does not want to.  He proceeded to tell RN that he told the doctor he would wear it and that he was just trying to establish a routine with it.  RN asked patient what time he normally goes  to  bed at home so we can better help him with compliance.  He stated it usually around 11pm.  So would be best for respiratory to attempt placing on BiPAP around 1030-11pm if possible

## 2020-09-26 NOTE — Progress Notes (Signed)
PROGRESS NOTE    Austin Davis  FKC:[id number] DOB: 26-Jan-1940 DOA: 09/16/2020 PCP: [name], MD   Brief Narrative:  80 year old male with a history of paroxysmal atrial fibrillation on anticoagulation, essential tremor, obstructive sleep apnea on CPAP, who has had a progressive decline in his health, weight loss over the last year due to decreased p.o. intake, presented with worsening shortness of breath.  He was noted to be tachycardic, hypotensive and hypoxic on arrival.  Initial ABG showed respiratory acidosis with a PCO2 of 90.  He was started on BiPAP.  INR was noted to be 10.9 and he was given vitamin K in the emergency room.  Chest x-ray did not show any evidence of infiltrates.  Assessment & Plan:   Acute on chronic respiratory failure with hypoxia and hypercapnia -Patient has a reported history of obstructive sleep apnea and was recently set up with CPAP in 07/2020.  He reportedly had difficulty tolerating this and was using it infrequently -No significant findings on chest x-ray on admission -Continues to require BiPAP at bedtime and as needed -Seen by pulmonology with recommendations for home trilogy noninvasive ventilator -He will also need repeat outpatient sleep study as well as PFTs -Suspicion is that his hypoventilation may be due to progressive severe weakness from malnutrition -He should continue on BiPAP nightly while in the hospital  Acute on Chronic combined CHF -Ejection fraction 45 to 50% with grade 1 diastolic dysfunction -He is noted to have elevated BNP at 876 and evidence of pleural effusions on CT imaging -His malnutrition and low protein state likely contributing to anasarca -He does have significant orthopnea -Continue Lasix  Failure to thrive/generalized weakness/severe malnutrition - Etiology for his progressive failure to thrive is not entirely clear at this point - Imaging without overt underlying malignancy, cortisol within normal limits,  previous PET scan outpatient unremarkable -There is concern that he may have an underlying neurodegenerative condition, since he does have a tremor, dysphagia, generalized weakness, and often does appear to be confused -Imaging thus far negative, MRI initially planned but patient is declining as we discussed it would likely not change his current inpatient progress or discharge and he will follow with outpatient neurology where he was previously following-I feel it will be helpful if patient can undergo MRI brain for further characterization -He has been started on high-dose thiamine replacement to see if this helps with his overall confusion with minimal improvement.  Dysphagia -Speech therapy following -We will need modified barium swallow once he is able to tolerate it -Although I suspect his dysphagia is from general deconditioning weakness  Paroxysmal atrial fibrillation SVT -Overall heart rates have been stable -He has been started on low-dose Cardizem -He is anticoagulated with Coumadin -Follow-up with cardiology as an outpatient  Essential tremor, chronic -He is followed by [facility] -Intolerant of beta-blockers in the past -Uses benzos as needed  Severe protein calorie malnutrition -BMI 16.2 -Nutrition following  Constipation -Large stool burden noted on CT scan -Patient had large bowel movement yesterday  Goals of care -Appreciate assistance from palliative care -CODE STATUS has been adjusted -Seen by physical therapy with recommendations for skilled nursing facility placement  DVT prophylaxis: SCDs Start: 09/16/20 1920  Code Status: Partial code - ACLS MEDS AND BIPAP ONLY; no intubation, no CPR, no defibrillation Family Communication: Updated patient's daughter Disposition Plan: Status is: Inpatient  Remains inpatient appropriate because:Ongoing diagnostic testing needed not appropriate for outpatient work up and Inpatient level of care appropriate due to  severity of illness  Dispo: The patient is from: Home              Anticipated d/c is to: SNF              Patient currently is not medically stable to d/c.   Difficult to place patient No  Consultants:  Palliative care Pulmonology  Procedures:  None   Subjective: No acute issues or events overnight, early this morning patient somewhat somnolent dyspneic placed on BiPAP with improvement in symptoms.  Denies chest pain nausea vomiting diarrhea constipation headache fevers or chills.  Objective: Vitals:   09/26/20 0455 09/26/20 0511 09/26/20 0829 09/26/20 0846  BP: 128/84  112/77   Pulse: 93  100 84  Resp: 20  (!) 24 12  Temp: 97.8 F (36.6 C)  97.6 F (36.4 C)   TempSrc: Oral  Oral   SpO2: 100%  98% 93%  Weight:  48.9 kg    Height:        Intake/Output Summary (Last 24 hours) at 09/26/2020 0857 Last data filed at 09/26/2020 0500 Gross per 24 hour  Intake 700 ml  Output 1175 ml  Net -475 ml    Filed Weights   09/21/20 1439 09/25/20 0601 09/26/20 0511  Weight: 46.3 kg 47.1 kg 48.9 kg    Examination:  General exam: Appears calm and comfortable  Respiratory system: Clear to auscultation. Respiratory effort normal. Cardiovascular system: S1 & S2 heard, RRR. No JVD, murmurs, rubs, gallops or clicks. No pedal edema. Gastrointestinal system: Abdomen is nondistended, soft and nontender. No organomegaly or masses felt. Normal bowel sounds heard. Central nervous system: Alert and oriented. No focal neurological deficits. Tremor in right hand and jaw Extremities: Symmetric 4 x 5 power bilaterally. Skin: No rashes, lesions or ulcers Psychiatry: Judgement and insight appear normal. Mood & affect appropriate.     Data Reviewed: I have personally reviewed following labs and imaging studies  CBC: Recent Labs  Lab 09/22/20 0208 09/23/20 0259 09/24/20 0418 09/25/20 0241 09/26/20 0438  WBC 5.4 5.4 7.0 7.6 8.3  HGB 12.6* 12.1* 13.5 13.3 13.5  HCT 39.3 38.0* 42.1 41.0 41.8   MCV 99.7 100.5* 99.3 98.8 99.8  PLT 143* 147* 156 167 301    Basic Metabolic Panel: Recent Labs  Lab 09/20/20 0348 09/21/20 0242 09/23/20 0259 09/24/20 0418 09/25/20 0241 09/26/20 0438  NA 137 141 138 139 137 138  K 4.0 3.8 4.1 3.7 3.7 3.9  CL 94* 92* 91* 93* 94* 92*  CO2 32 42* 37* 39* 35* 40*  GLUCOSE 126* 127* 91 116* 110* 104*  BUN 16 30* 23 19 18 18   CREATININE 0.61 0.80 0.50* 0.46* 0.46* 0.54*  CALCIUM 9.0 9.1 8.5* 8.7* 8.5* 8.7*  MG 2.0  --   --   --   --   --   PHOS 2.2*  --  2.0* 2.2* 3.7  --     GFR: Estimated Creatinine Clearance: 51.8 mL/min (A) (by C-G formula based on SCr of 0.54 mg/dL (L)). Liver Function Tests: Recent Labs  Lab 09/20/20 0348 09/23/20 0259 09/24/20 0418 09/25/20 0241  AST 23  --   --   --   ALT 31  --   --   --   ALKPHOS 43  --   --   --   BILITOT 1.5*  --   --   --   PROT 5.6*  --   --   --   ALBUMIN 3.5 3.1* 3.2*  3.2*    No results for input(s): LIPASE, AMYLASE in the last 168 hours. No results for input(s): AMMONIA in the last 168 hours. Coagulation Profile: Recent Labs  Lab 09/22/20 0208 09/23/20 0259 09/24/20 0418 09/25/20 0241 09/26/20 0438  INR 2.0* 1.4* 1.6* 1.7* 2.1*    Cardiac Enzymes: Recent Labs  Lab 09/22/20 0208  CKTOTAL 26*    BNP (last 3 results) No results for input(s): PROBNP in the last 8760 hours. HbA1C: No results for input(s): HGBA1C in the last 72 hours. CBG: No results for input(s): GLUCAP in the last 168 hours. Lipid Profile: No results for input(s): CHOL, HDL, LDLCALC, TRIG, CHOLHDL, LDLDIRECT in the last 72 hours. Thyroid Function Tests: No results for input(s): TSH, T4TOTAL, FREET4, T3FREE, THYROIDAB in the last 72 hours. Anemia Panel: No results for input(s): VITAMINB12, FOLATE, FERRITIN, TIBC, IRON, RETICCTPCT in the last 72 hours. Sepsis Labs: No results for input(s): PROCALCITON, LATICACIDVEN in the last 168 hours.  Recent Results (from the past 240 hour(s))  Resp Panel by  RT-PCR (Flu A&B, Covid) Nasopharyngeal Swab     Status: None   Collection Time: 09/16/20  3:22 PM   Specimen: Nasopharyngeal Swab; Nasopharyngeal(NP) swabs in vial transport medium  Result Value Ref Range Status   SARS Coronavirus 2 by RT PCR NEGATIVE NEGATIVE Final    Comment: (NOTE) SARS-CoV-2 target nucleic acids are NOT DETECTED.  The SARS-CoV-2 RNA is generally detectable in upper respiratory specimens during the acute phase of infection. The lowest concentration of SARS-CoV-2 viral copies this assay can detect is 138 copies/mL. A negative result does not preclude SARS-Cov-2 infection and should not be used as the sole basis for treatment or other patient management decisions. A negative result may occur with  improper specimen collection/handling, submission of specimen other than nasopharyngeal swab, presence of viral mutation(s) within the areas targeted by this assay, and inadequate number of viral copies(<138 copies/mL). A negative result must be combined with clinical observations, patient history, and epidemiological information. The expected result is Negative.  Fact Sheet for Patients:  EntrepreneurPulse.com.au  Fact Sheet for Healthcare Providers:  IncredibleEmployment.be  This test is no t yet approved or cleared by the [place] FDA and  has been authorized for detection and/or diagnosis of SARS-CoV-2 by FDA under an Emergency Use Authorization (EUA). This EUA will remain  in effect (meaning this test can be used) for the duration of the COVID-19 declaration under Section 564(b)(1) of the Act, 21 U.S.C.section 360bbb-3(b)(1), unless the authorization is terminated  or revoked sooner.       Influenza A by PCR NEGATIVE NEGATIVE Final   Influenza B by PCR NEGATIVE NEGATIVE Final    Comment: (NOTE) The Xpert Xpress SARS-CoV-2/FLU/RSV plus assay is intended as an aid in the diagnosis of influenza from Nasopharyngeal swab  specimens and should not be used as a sole basis for treatment. Nasal washings and aspirates are unacceptable for Xpert Xpress SARS-CoV-2/FLU/RSV testing.  Fact Sheet for Patients: EntrepreneurPulse.com.au  Fact Sheet for Healthcare Providers: IncredibleEmployment.be  This test is not yet approved or cleared by the [place] FDA and has been authorized for detection and/or diagnosis of SARS-CoV-2 by FDA under an Emergency Use Authorization (EUA). This EUA will remain in effect (meaning this test can be used) for the duration of the COVID-19 declaration under Section 564(b)(1) of the Act, 21 U.S.C. section 360bbb-3(b)(1), unless the authorization is terminated or revoked.  Performed at [facility], [place] [address], [place], [place] [address]  MRSA Next Gen by PCR, Nasal     Status: None   Collection Time: 09/16/20  8:23 PM   Specimen: Nasal Mucosa; Nasal Swab  Result Value Ref Range Status   MRSA by PCR Next Gen NOT DETECTED NOT DETECTED Final    Comment: (NOTE) The GeneXpert MRSA Assay (FDA approved for NASAL specimens only), is one component of a comprehensive MRSA colonization surveillance program. It is not intended to diagnose MRSA infection nor to guide or monitor treatment for MRSA infections. Test performance is not FDA approved in patients less than 1 years old. Performed at [facility], [place] [address] [place], [place] [address]           Radiology Studies: No results found.      Scheduled Meds:  ascorbic acid  500 mg Oral Daily   chlorhexidine  15 mL Mouth Rinse BID   Chlorhexidine Gluconate Cloth  6 each Topical Q0600   cholecalciferol  2,000 Units Oral Daily   dextromethorphan-guaiFENesin  1 tablet Oral BID   diltiazem  30 mg Oral Q12H   feeding supplement  237 mL Oral BID BM   folic acid  1 mg Oral Daily   furosemide  20 mg Intravenous Daily   magic mouthwash  10 mL  Oral TID   mouth rinse  15 mL Mouth Rinse q12n4p   mirtazapine  7.5 mg Oral QHS   multivitamin with minerals  1 tablet Oral Daily   omega-3 acid ethyl esters  1 g Oral Daily   pantoprazole (PROTONIX) IV  40 mg Intravenous Q12H   Ensure Max Protein  11 oz Oral Daily   senna-docusate  1 tablet Oral BID   Warfarin - Pharmacist Dosing Inpatient   Does not apply q1600   zinc sulfate  220 mg Oral BID     [START ON 09/27/2020] thiamine injection     thiamine injection 500 mg (09/26/20 0156)    LOS: 10 days   Time spent: 33mins  Antionne Enrique C Aliya Sol, DO [facility]   If 7PM-7AM, please contact night-coverage [email or web address]  09/26/2020, 8:57 AM

## 2020-09-26 NOTE — TOC Progression Note (Signed)
Transition of Care [facility]) - Progression Note    Patient Details  Name: Austin Davis MRN: [id number] Date of Birth: [birth year]  [facility]) CM/SW Contact  Kameran Lallier, Juliann Pulse, RN Phone Number: 09/26/2020, 2:35 PM  Clinical Narrative:[facility] accepted-rep Narda Rutherford following-she will confirm if able to accept with Bipap tomorrow.       Expected Discharge Plan: [place] Barriers to Discharge: Continued Medical Work up  Expected Discharge Plan and Services Expected Discharge Plan: [place]   Discharge Planning Services: CM Consult Post Acute Care Choice: [place] Living arrangements for the past 2 months: Single Family Home                                       Social Determinants of Health (SDOH) Interventions    Readmission Risk Interventions No flowsheet data found.

## 2020-09-26 NOTE — Progress Notes (Signed)
ANTICOAGULATION CONSULT NOTE - Follow Up Consult  Pharmacy Consult for warfarin Indication: atrial fibrillation  Allergies  Allergen Reactions   Prednisone Other (See Comments)    Makes skin crawl, rapid HR   Cortisone Palpitations   Patient Measurements: Height: 5\' 7"  (170.2 cm) Weight: 48.9 kg (107 lb 12.9 oz) IBW/kg (Calculated) : 66.1  Vital Signs: Temp: 97.6 F (36.4 C) (09/07 0829) Temp Source: Oral (09/07 0829) BP: 112/77 (09/07 0829) Pulse Rate: 84 (09/07 0846)  Labs: Recent Labs    09/23/20 1819 09/24/20 0418 09/24/20 0418 09/25/20 0241 09/26/20 0438  HGB  --  13.5   < > 13.3 13.5  HCT  --  42.1  --  41.0 41.8  PLT  --  156  --  167 191  LABPROT  --  18.8*  --  19.8* 23.4*  INR  --  1.6*  --  1.7* 2.1*  HEPARINUNFRC 0.20* 0.30  --   --   --   CREATININE  --  0.46*  --  0.46* 0.54*   < > = values in this interval not displayed.   Estimated Creatinine Clearance: 51.8 mL/min (A) (by C-G formula based on SCr of 0.54 mg/dL (L)).  Home warfarin dosing unclear on med rec.  [facility] was contacted to clarify dosing.  Current regimen is 5mg  daily PLUS 1mg  once weekly. Unable to tell me what day of the week patient is supposed to take the 1mg , but said it was started on 08/09/20 due to DDI with primidone.  Assessment: 35 yoM admitted on 8/28 with acute hypoxic and hypercarbic respite failure . PMH significant for chronic warfarin anticoagulation, AFib, s/p mitral valve annuloplasty and MAZE 2005.  Admit INR 10.9 and was given Vitamin K 2.5 mg PO x1 8/28 Pharmacy is consulted to resume warfarin dosing on 8/30.  Heparin stopped 9/5 at 10am for blood noted in urine, continue Warfarin  Today, 09/26/2020: INR therapeutic now CBC:  Hgb stable, Plt stable No bleeding or complications reported by RN.   DDI:  suspect that vitamin K will suppress INR for several days.  Note that patient was NOT taking primidone prior to admission.  Diet:  regular, charted 15-20%  intake, but taking supplements  Goal of Therapy:  INR 2-3 Monitor platelets by anticoagulation protocol: Yes   Plan:  Repeat warfarin 5 mg PO x 1 at 1600 Daily PT/INR   Adrian Saran, PharmD, BCPS Secure Chat if ?s 09/26/2020 9:12 AM

## 2020-09-26 NOTE — Progress Notes (Signed)
Rt placed pt on BIPAP for morning sleeping. Pt agreed to go on due to pt very sleepy and restless. Pt can respond, talk and follow commands .

## 2020-09-26 NOTE — Care Management Important Message (Signed)
Important Message  Patient Details IM Letter given to the Patient. Name: Austin Davis MRN: [id number] Date of Birth: 1940-03-28   Medicare Important Message Given:  Yes     Kerin Salen 09/26/2020, 12:39 PM

## 2020-09-26 NOTE — Progress Notes (Signed)
Pt currently off BIPAP at this time.

## 2020-09-27 DIAGNOSIS — J9622 Acute and chronic respiratory failure with hypercapnia: Secondary | ICD-10-CM | POA: Diagnosis not present

## 2020-09-27 DIAGNOSIS — J9621 Acute and chronic respiratory failure with hypoxia: Secondary | ICD-10-CM | POA: Diagnosis not present

## 2020-09-27 LAB — CBC
HCT: 37.1 % — ABNORMAL LOW (ref 39.0–52.0)
Hemoglobin: 12.1 g/dL — ABNORMAL LOW (ref 13.0–17.0)
MCH: 32 pg (ref 26.0–34.0)
MCHC: 32.6 g/dL (ref 30.0–36.0)
MCV: 98.1 fL (ref 80.0–100.0)
Platelets: 151 10*3/uL (ref 150–400)
RBC: 3.78 MIL/uL — ABNORMAL LOW (ref 4.22–5.81)
RDW: 15.2 % (ref 11.5–15.5)
WBC: 6.4 10*3/uL (ref 4.0–10.5)
nRBC: 0 % (ref 0.0–0.2)

## 2020-09-27 LAB — PROTIME-INR
INR: 2.6 — ABNORMAL HIGH (ref 0.8–1.2)
Prothrombin Time: 27.4 seconds — ABNORMAL HIGH (ref 11.4–15.2)

## 2020-09-27 LAB — ANA W/REFLEX IF POSITIVE: Anti Nuclear Antibody (ANA): NEGATIVE

## 2020-09-27 MED ORDER — LACTATED RINGERS IV BOLUS
500.0000 mL | Freq: Once | INTRAVENOUS | Status: AC
  Administered 2020-09-27: 500 mL via INTRAVENOUS

## 2020-09-27 MED ORDER — PROSOURCE PLUS PO LIQD
30.0000 mL | Freq: Two times a day (BID) | ORAL | Status: DC
  Administered 2020-09-28: 30 mL via ORAL
  Filled 2020-09-27 (×2): qty 30

## 2020-09-27 MED ORDER — METHYLPREDNISOLONE SODIUM SUCC 40 MG IJ SOLR
40.0000 mg | Freq: Two times a day (BID) | INTRAMUSCULAR | Status: AC
  Administered 2020-09-27 – 2020-09-28 (×3): 40 mg via INTRAVENOUS
  Filled 2020-09-27 (×3): qty 1

## 2020-09-27 MED ORDER — WARFARIN SODIUM 5 MG PO TABS
5.0000 mg | ORAL_TABLET | Freq: Once | ORAL | Status: AC
  Administered 2020-09-27: 5 mg via ORAL
  Filled 2020-09-27: qty 1

## 2020-09-27 MED ORDER — MIDODRINE HCL 5 MG PO TABS: 2.5000 mg | ORAL_TABLET | Freq: Three times a day (TID) | ORAL | Status: DC

## 2020-09-27 MED ORDER — MIDODRINE HCL 5 MG PO TABS
5.0000 mg | ORAL_TABLET | Freq: Three times a day (TID) | ORAL | Status: DC
  Administered 2020-09-27 – 2020-09-29 (×5): 5 mg via ORAL
  Filled 2020-09-27 (×5): qty 1

## 2020-09-27 NOTE — Progress Notes (Signed)
ANTICOAGULATION CONSULT NOTE - Follow Up Consult  Pharmacy Consult for warfarin Indication: atrial fibrillation  Allergies  Allergen Reactions   Prednisone Other (See Comments)    Makes skin crawl, rapid HR   Cortisone Palpitations   Patient Measurements: Height: 5\' 7"  (170.2 cm) Weight: 48.9 kg (107 lb 12.9 oz) IBW/kg (Calculated) : 66.1  Vital Signs: Temp: 98 F (36.7 C) (09/08 1022) Temp Source: Oral (09/08 1022) BP: 72/46 (09/08 1134) Pulse Rate: 83 (09/08 1130)  Labs: Recent Labs    09/25/20 0241 09/26/20 0438 09/27/20 0428  HGB 13.3 13.5 12.1*  HCT 41.0 41.8 37.1*  PLT 167 191 151  LABPROT 19.8* 23.4* 27.4*  INR 1.7* 2.1* 2.6*  CREATININE 0.46* 0.54*  --    Estimated Creatinine Clearance: 51.8 mL/min (A) (by C-G formula based on SCr of 0.54 mg/dL (L)).  Home warfarin dosing unclear on med rec.  [facility] was contacted to clarify dosing.  Current regimen is 5mg  daily PLUS 1mg  once weekly. Unable to tell me what day of the week patient is supposed to take the 1mg , but said it was started on 08/09/20 due to DDI with primidone.  Assessment: 89 yoM admitted on 8/28 with acute hypoxic and hypercarbic respiratory failure. PMH significant for chronic warfarin anticoagulation, AFib, s/p mitral valve annuloplasty and MAZE 2005. Pharmacy is consulted to resume warfarin dosing on 8/30.  Significant events: Admit INR 10.9 and was given Vitamin K 2.5 mg PO x1 8/28 Heparin stopped 9/5 at 10am for blood noted in urine, continue Warfarin  Today, 09/27/2020: INR remains therapeutic though increased from yesterday CBC: Hgb now slightly low, Plt stable WNL No bleeding or complications reported by RN.   DDI: suspect that vitamin K will suppress INR for several days.  Note that patient was NOT taking primidone prior to admission.  Diet:  regular, charted 15-20% intake, but taking supplements  Goal of Therapy:  INR 2-3 Monitor platelets by anticoagulation protocol:  Yes   Plan:  Repeat warfarin 5 mg PO x 1 at 1600 Daily PT/INR CBC as needed while on warfarin   Reuel Boom, PharmD, BCPS [phone] 09/27/2020, 12:08 PM

## 2020-09-27 NOTE — TOC Progression Note (Signed)
Transition of Care [facility]) - Progression Note    Patient Details  Name: Austin Davis MRN: [id number] Date of Birth: 06-Feb-1940  [facility]) CM/SW Contact  Therin Vetsch, Juliann Pulse, RN Phone Number: 09/27/2020, 1:00 PM  Clinical Narrative: Damaris Schooner to dtr [name] about LTAC referral-in agreement-[facility] reps notified to contact dtr Kelly-await outcome.    Expected Discharge Plan: Long Term Acute Care (LTAC) Barriers to Discharge: Continued Medical Work up  Expected Discharge Plan and Services Expected Discharge Plan: [place] (LTAC)   Discharge Planning Services: CM Consult Post Acute Care Choice: [place] Living arrangements for the past 2 months: Single Family Home                                       Social Determinants of Health (SDOH) Interventions    Readmission Risk Interventions No flowsheet data found.

## 2020-09-27 NOTE — Progress Notes (Signed)
Patient with decreased BP.  On Bipap and responsive.  Called MD and received orders for 1L LR bolus which was started and also called Rapid Response.

## 2020-09-27 NOTE — Significant Event (Signed)
Rapid Response Event Note   Reason for Call : Hypotension  Notified by beside RN that patient was replaced back on BIPAP for some tachypnea and labored breathing. After being placed back on BIPAP, patient's BP became hypotensive, systolic BP in low to mid 60s. Upon arrival, patient was minimally responsive on BIPAP. MD Avon Gully ordered 1 Liter LR bolus.  Initial Focused Assessment:  Neuro: Lethargic/minimally responsive, but able to follow simple commands, oriented x3.  Cardiac: SR, HR 70s-low 80s, BP hypotensive, slowly responding to fluid resuscitation. See vitals for BP results. Pulmonary: Utilizing BIPAP, RR 12-16, O2 Sats 100%, breath sounds clear and diminished bilaterally.    Interventions:  1 Liter of LR bolus administered -Additional 500 cc LR bolus also administered -BP responded to fluid resuscitation for now   Plan of Care:  Patient can remain on progressive unit for now. If patient having changes in respiratory status on BIPAP or begins to have a decrease in blood pressure, or has change in mental status please call Rapid Response at [phone].   Event Summary:   MD Notified: MD Avon Gully and MD Hilma Favors Call Time: [phone] Arrival Time: [place] Time: [place], RN

## 2020-09-27 NOTE — Progress Notes (Signed)
   09/27/20 1022  Assess: MEWS Score  Temp 98 F (36.7 C)  BP 134/89  Pulse Rate (!) 108  Resp (!) 40  Level of Consciousness Alert  SpO2 92 %  O2 Device Nasal Cannula (placed on Bipap)  O2 Flow Rate (L/min) 3 L/min  Assess: MEWS Score  MEWS Temp 0  MEWS Systolic 0  MEWS Pulse 1  MEWS RR 3  MEWS LOC 0  MEWS Score 4  MEWS Score Color Red  Assess: if the MEWS score is Yellow or Red  Were vital signs taken at a resting state? Yes  Focused Assessment Change from prior assessment (see assessment flowsheet)  Does the patient meet 2 or more of the SIRS criteria? Yes  Does the patient have a confirmed or suspected source of infection? No  MEWS guidelines implemented *See Row Information* Yes  Treat  MEWS Interventions Consulted Respiratory Therapy;Other (Comment) (placed on bipap)  Take Vital Signs  Increase Vital Sign Frequency  Red: Q 1hr X 4 then Q 4hr X 4, if remains red, continue Q 4hrs  Escalate  MEWS: Escalate Red: discuss with charge nurse/RN and provider, consider discussing with RRT  Notify: Charge Nurse/RN  Name of Charge Nurse/RN Notified Chancy Hurter  Date Charge Nurse/RN Notified 09/27/20  Time Charge Nurse/RN Notified 1038  Notify: Provider  Provider Name/Title MD Avon Gully  Date Provider Notified 09/27/20  Time Provider Notified 1040  Notification Type Page  Notification Reason Change in status  Provider response At bedside  Date of Provider Response 09/27/20  Time of Provider Response 1045  Document  Patient Outcome Stabilized after interventions  Progress note created (see row info) Yes  Assess: SIRS CRITERIA  SIRS Temperature  0  SIRS Pulse 1  SIRS Respirations  1  SIRS WBC 0  SIRS Score Sum  2    Pt with increased WOB and respirations of 40, also tachycardic. Pt informed that we needed to resume BiPAP. Pt agreeable. BiPAP placed, respiratory consulted and MD to bedside to assess patient.

## 2020-09-27 NOTE — Progress Notes (Signed)
PROGRESS NOTE    Austin Davis  LOV:[id number] DOB: 04-19-1940 DOA: 09/16/2020 PCP: [name], MD   Brief Narrative:  80 year old male with a history of paroxysmal atrial fibrillation on anticoagulation, essential tremor, obstructive sleep apnea on CPAP, who has had a progressive decline in his health, weight loss over the last year due to decreased p.o. intake, presented with worsening shortness of breath.  He was noted to be tachycardic, hypotensive and hypoxic on arrival.  Initial ABG showed respiratory acidosis with a PCO2 of 90.  He was started on BiPAP.  INR was noted to be 10.9 and he was given vitamin K in the emergency room.  Chest x-ray did not show any evidence of infiltrates.  Assessment & Plan:   Goals of care - Multiple rapid responses over the past 48 hours in the setting of acute mental status changes, hypoxia, dyspnea as well as hypertension -Patient continues to have abysmally poor p.o. intake both with fluids and nutrition. -Palliative care following, continues as partial code with DO NOT INTUBATE or perform CPR orders most notably. -Given patient's poor p.o. intake his prognosis is somewhat poor, pending the next few days if patient does not make meaningful recovery will likely transition to palliative care and hospice per discussion with patient at bedside today, palliative is been in touch with patient's family as well to convey his wishes.  Appreciate their insight and recommendations in this complicated case  Acute on chronic respiratory failure with hypoxia and hypercapnia -Patient has a reported history of obstructive sleep apnea and was recently set up with CPAP in 07/2020.  He reportedly had difficulty tolerating this and was using it infrequently -No clear etiology for acute respiratory failure at this point, imaging is unremarkable for overt findings, continue BiPAP/supportive care as indicated as well as at night.  Pulmonology previously evaluated  recommending home Trelegy -Likely exacerbated by patient's malnutrition, poor p.o. intake and general debility  Acute on Chronic combined CHF resolved -Ejection fraction 45 to 50% with grade 1 diastolic dysfunction -BNP elevated at 876 at intake, diuresed well, likely not a major player in respiratory symptoms as above at this point -He continues to have significant orthopnea despite diuresis - Continue Lasix  Failure to thrive/generalized weakness/severe malnutrition -Unclear etiology, will attempt steroid stimulation over the next 48 hours in hopes to invoke some appetite and the patient given this is his major hurdle at this point without any other acute findings to explain his malnourished state other than poor p.o. intake. -Questionably underlying neurodegenerative disease but patient currently refusing MRI due to claustrophobia and orthopnea, followed outpatient previously with outpatient neurology with no MRI at that time, unclear if MRI which really alter patient's medical course at this point.  He remains without focal deficits.  Dysphagia -Speech therapy following -Likely secondary to above malnourished state and generalized weakness/debility   Paroxysmal atrial fibrillation SVT -Continue Cardizem as tolerated -Continue Coumadin -Follow-up with cardiology as an outpatient  Essential tremor, chronic -He is followed by [facility] -Intolerant of beta-blockers in the past -Uses benzos as needed  Severe protein calorie malnutrition -BMI 16.2 -Nutrition following -Likely the crux of his debility at this point given profound ongoing poor p.o. intake and fluid intake requiring IV fluids at this point to maintain  Goals of care -Appreciate assistance from palliative care -CODE STATUS has been adjusted -Seen by physical therapy with recommendations for skilled nursing facility placement  DVT prophylaxis: SCDs Start: 09/16/20 1920  Code Status: Partial code -  ACLS MEDS  AND BIPAP ONLY; no intubation, no CPR, no defibrillation Family Communication: Updated at bedside today Disposition Plan: Status is: Inpatient  Remains inpatient appropriate because:Ongoing diagnostic testing needed not appropriate for outpatient work up and Inpatient level of care appropriate due to severity of illness  Dispo: The patient is from: Home              Anticipated d/c is to: SNF              Patient currently is not medically stable to d/c.   Difficult to place patient No  Consultants:  Palliative care Pulmonology  Procedures:  None   Subjective: Multiple rapid response over the past 24 hours with worsening hypotension mental status and respiratory status  Objective: Vitals:   09/26/20 2136 09/26/20 2227 09/27/20 0300 09/27/20 0511  BP: 121/80   113/81  Pulse: 94 98 92 90  Resp: 16 (!) 21 15   Temp: (!) 97.5 F (36.4 C)   98.2 F (36.8 C)  TempSrc: Oral   Oral  SpO2: 99% 100% 99% 99%  Weight:      Height:        Intake/Output Summary (Last 24 hours) at 09/27/2020 0732 Last data filed at 09/27/2020 0256 Gross per 24 hour  Intake 286 ml  Output 1850 ml  Net -1564 ml    Filed Weights   09/21/20 1439 09/25/20 0601 09/26/20 0511  Weight: 46.3 kg 47.1 kg 48.9 kg    Examination:  General exam: Appears calm and comfortable, tolerating BiPAP well Respiratory system: Clear to auscultation. Cardiovascular system: S1 & S2 heard, RRR. No JVD, murmurs, rubs, gallops or clicks. No pedal edema. Gastrointestinal system: Abdomen is nondistended, soft and nontender. No organomegaly or masses felt. Normal bowel sounds heard. Central nervous system: Alert and oriented. No focal neurological deficits. Tremor in right hand and jaw Extremities: Symmetric 4 x 5 power bilaterally. Skin: No rashes, lesions or ulcers Psychiatry: Judgement and insight appear normal. Mood & affect appropriate.   Data Reviewed: I have personally reviewed following labs and imaging  studies  CBC: Recent Labs  Lab 09/23/20 0259 09/24/20 0418 09/25/20 0241 09/26/20 0438 09/27/20 0428  WBC 5.4 7.0 7.6 8.3 6.4  HGB 12.1* 13.5 13.3 13.5 12.1*  HCT 38.0* 42.1 41.0 41.8 37.1*  MCV 100.5* 99.3 98.8 99.8 98.1  PLT 147* 156 167 191 841    Basic Metabolic Panel: Recent Labs  Lab 09/21/20 0242 09/23/20 0259 09/24/20 0418 09/25/20 0241 09/26/20 0438  NA 141 138 139 137 138  K 3.8 4.1 3.7 3.7 3.9  CL 92* 91* 93* 94* 92*  CO2 42* 37* 39* 35* 40*  GLUCOSE 127* 91 116* 110* 104*  BUN 30* 23 19 18 18   CREATININE 0.80 0.50* 0.46* 0.46* 0.54*  CALCIUM 9.1 8.5* 8.7* 8.5* 8.7*  PHOS  --  2.0* 2.2* 3.7  --     GFR: Estimated Creatinine Clearance: 51.8 mL/min (A) (by C-G formula based on SCr of 0.54 mg/dL (L)). Liver Function Tests: Recent Labs  Lab 09/23/20 0259 09/24/20 0418 09/25/20 0241  ALBUMIN 3.1* 3.2* 3.2*    No results for input(s): LIPASE, AMYLASE in the last 168 hours. No results for input(s): AMMONIA in the last 168 hours. Coagulation Profile: Recent Labs  Lab 09/23/20 0259 09/24/20 0418 09/25/20 0241 09/26/20 0438 09/27/20 0428  INR 1.4* 1.6* 1.7* 2.1* 2.6*    Cardiac Enzymes: Recent Labs  Lab 09/22/20 0208  CKTOTAL 26*    BNP (  last 3 results) No results for input(s): PROBNP in the last 8760 hours. HbA1C: No results for input(s): HGBA1C in the last 72 hours. CBG: No results for input(s): GLUCAP in the last 168 hours. Lipid Profile: No results for input(s): CHOL, HDL, LDLCALC, TRIG, CHOLHDL, LDLDIRECT in the last 72 hours. Thyroid Function Tests: No results for input(s): TSH, T4TOTAL, FREET4, T3FREE, THYROIDAB in the last 72 hours. Anemia Panel: No results for input(s): VITAMINB12, FOLATE, FERRITIN, TIBC, IRON, RETICCTPCT in the last 72 hours. Sepsis Labs: No results for input(s): PROCALCITON, LATICACIDVEN in the last 168 hours.  No results found for this or any previous visit (from the past 240 hour(s)).         Radiology Studies: No results found.      Scheduled Meds:  ascorbic acid  500 mg Oral Daily   chlorhexidine  15 mL Mouth Rinse BID   cholecalciferol  2,000 Units Oral Daily   dextromethorphan-guaiFENesin  1 tablet Oral BID   diltiazem  30 mg Oral Q12H   feeding supplement  237 mL Oral BID BM   folic acid  1 mg Oral Daily   furosemide  20 mg Intravenous Daily   magic mouthwash  10 mL Oral TID   mouth rinse  15 mL Mouth Rinse q12n4p   mirtazapine  7.5 mg Oral QHS   multivitamin with minerals  1 tablet Oral Daily   omega-3 acid ethyl esters  1 g Oral Daily   pantoprazole (PROTONIX) IV  40 mg Intravenous Q12H   Ensure Max Protein  11 oz Oral Daily   senna-docusate  1 tablet Oral BID   Warfarin - Pharmacist Dosing Inpatient   Does not apply q1600   zinc sulfate  220 mg Oral BID     thiamine injection      LOS: 11 days   Time spent: 60mins  Austin Davis C Mindel Friscia, DO [facility]   If 7PM-7AM, please contact night-coverage [email or web address]  09/27/2020, 7:32 AM

## 2020-09-27 NOTE — Progress Notes (Signed)
RN placed pt on BIPAP for increased WOB and AMS.MD and RN at bedside.

## 2020-09-27 NOTE — Progress Notes (Signed)
Pt remains on BIPAP at this time for rest.

## 2020-09-27 NOTE — Progress Notes (Signed)
Nutrition Follow-up  DOCUMENTATION CODES:   Severe malnutrition in context of chronic illness, Underweight  INTERVENTION:  - continue Ensure Plus BID and Ensure Max once/day.  - will order 30 ml Prosource Plus BID, each supplement provides 100 kcal and 15 grams protein.  - monitor for ability for MBS and results/recommendations.   NUTRITION DIAGNOSIS:   Severe Malnutrition related to chronic illness as evidenced by percent weight loss, energy intake < or equal to 75% for > or equal to 1 month, severe fat depletion, severe muscle depletion. -ongoing  GOAL:   Patient will meet greater than or equal to 90% of their needs -unmet despite oral nutrition supplements  MONITOR:   PO intake, Supplement acceptance, Labs, Weight trends, I & O's  ASSESSMENT:   80 year old man who presented to [facility] ED 8/29 with SOB, dyspnea and fatigue/malaise, worsening over the last several weeks prior to admission. PMHx significant for HLD, PAF, MVP s/p MV annuloplasty (2005, on Coumadin), PAD, CAD, OSA (on CPAP, noncompliant), history of GIB (2019).     On admission, patient reported general decline of his wellness over the past 1-2 years (starting with COVID) with significant weight loss 2/2 poor appetite and loss of mobility (previously ambulating independently, now requiring assistance).  He has been eating 15-30% at meals for the past 5 days. He has been accepting Ensure Plus and Ensure Max 100% of the time offered.   Weight has been trending up since admission. Weight on 9/2 was 102 lb and weight yesterday was 108 lb. No information documented in the edema section of flow sheet since admission.   Lasix order in place. He is noted to be -5.75 L since admission.   Per notes: - Pulmonology has recommended Trilogy for home - concern for underlying neurodegenerative condition - high dose thiamine to determine if beneficial for overall confusion - dysphagia at baseline with plan for MBS once he is able to  tolerate it - severe PCM - large stool burden seen on CT--had a large BM the next day - from home with likely plan for SNF at d/c   Labs reviewed; Cl: 92, creatinine: 0.54 mg/dl, Ca: 8.7 mg/dl.  Medications reviewed; 500 mg ascorbic acid/day, 2000 units cholecalciferol/day, 1 mg folvite/day, 20 mg IV lasix/day, 1 tablet multivitamin with minerals/day, 1 g lovaza/day, 40 mg IV protonix BID, 1 tablet senokot BID, 250 mg IV thiamine/day 9/8-9/12, 220 mg zinc sulfate/day.    Diet Order:   Diet Order             Diet regular Room service appropriate? Yes; Fluid consistency: Thin  Diet effective now                   EDUCATION NEEDS:   Education needs have been addressed  Skin:  Skin Assessment: Skin Integrity Issues: Skin Integrity Issues:: Stage I, DTI DTI: vertebral column (newly documented 9/6) Stage I: coccyx  Last BM:  9/7 (smear)  Height:   Ht Readings from Last 1 Encounters:  09/16/20 5\' 7"  (1.702 m)    Weight:   Wt Readings from Last 1 Encounters:  09/26/20 48.9 kg     Estimated Nutritional Needs:  Kcal:  2000-2200 Protein:  90-105g Fluid:  2L/day      Jarome Matin, MS, RD, LDN, CNSC Inpatient Clinical Dietitian RD pager # available in AMION  After hours/weekend pager # available in [facility]

## 2020-09-28 DIAGNOSIS — J9621 Acute and chronic respiratory failure with hypoxia: Secondary | ICD-10-CM | POA: Diagnosis not present

## 2020-09-28 DIAGNOSIS — J9622 Acute and chronic respiratory failure with hypercapnia: Secondary | ICD-10-CM | POA: Diagnosis not present

## 2020-09-28 LAB — CBC
HCT: 35.4 % — ABNORMAL LOW (ref 39.0–52.0)
Hemoglobin: 12 g/dL — ABNORMAL LOW (ref 13.0–17.0)
MCH: 32.2 pg (ref 26.0–34.0)
MCHC: 33.9 g/dL (ref 30.0–36.0)
MCV: 94.9 fL (ref 80.0–100.0)
Platelets: 166 10*3/uL (ref 150–400)
RBC: 3.73 MIL/uL — ABNORMAL LOW (ref 4.22–5.81)
RDW: 15.3 % (ref 11.5–15.5)
WBC: 5.6 10*3/uL (ref 4.0–10.5)
nRBC: 0 % (ref 0.0–0.2)

## 2020-09-28 LAB — GLUCOSE, CAPILLARY: Glucose-Capillary: 140 mg/dL — ABNORMAL HIGH (ref 70–99)

## 2020-09-28 LAB — PROTIME-INR
INR: 2.1 — ABNORMAL HIGH (ref 0.8–1.2)
Prothrombin Time: 23.8 seconds — ABNORMAL HIGH (ref 11.4–15.2)

## 2020-09-28 MED ORDER — ADULT MULTIVITAMIN W/MINERALS CH: 1.0000 | ORAL_TABLET | Freq: Every day | ORAL | 0 refills | Status: DC

## 2020-09-28 MED ORDER — ENSURE ENLIVE PO LIQD: 237.0000 mL | Freq: Two times a day (BID) | ORAL | 12 refills | Status: DC

## 2020-09-28 MED ORDER — FOLIC ACID 1 MG PO TABS: 1.0000 mg | ORAL_TABLET | Freq: Every day | ORAL | 0 refills | Status: DC

## 2020-09-28 MED ORDER — PREDNISONE 10 MG PO TABS: ORAL_TABLET | ORAL | 0 refills | Status: AC

## 2020-09-28 MED ORDER — WARFARIN SODIUM 5 MG PO TABS
5.0000 mg | ORAL_TABLET | Freq: Once | ORAL | Status: AC
  Administered 2020-09-28: 5 mg via ORAL
  Filled 2020-09-28: qty 1

## 2020-09-28 MED ORDER — DILTIAZEM HCL 30 MG PO TABS: 30.0000 mg | ORAL_TABLET | Freq: Two times a day (BID) | ORAL | 0 refills | Status: DC

## 2020-09-28 MED ORDER — MIRTAZAPINE 7.5 MG PO TABS: 7.5000 mg | ORAL_TABLET | Freq: Every day | ORAL | 0 refills | Status: DC

## 2020-09-28 MED ORDER — MIDODRINE HCL 5 MG PO TABS: 5.0000 mg | ORAL_TABLET | Freq: Three times a day (TID) | ORAL | 0 refills | Status: DC

## 2020-09-28 MED ORDER — PANTOPRAZOLE SODIUM 40 MG PO TBEC: 40.0000 mg | DELAYED_RELEASE_TABLET | Freq: Two times a day (BID) | ORAL | 0 refills | Status: DC

## 2020-09-28 MED ORDER — SENNOSIDES-DOCUSATE SODIUM 8.6-50 MG PO TABS: 1.0000 | ORAL_TABLET | Freq: Two times a day (BID) | ORAL | 0 refills | Status: DC

## 2020-09-28 MED ORDER — PANTOPRAZOLE SODIUM 40 MG PO TBEC
40.0000 mg | DELAYED_RELEASE_TABLET | Freq: Two times a day (BID) | ORAL | Status: DC
  Administered 2020-09-28 – 2020-09-29 (×3): 40 mg via ORAL
  Filled 2020-09-28 (×3): qty 1

## 2020-09-28 MED ORDER — ENSURE MAX PROTEIN PO LIQD: 11.0000 [oz_av] | Freq: Every day | ORAL | 0 refills | Status: AC

## 2020-09-28 NOTE — Plan of Care (Signed)
  Problem: Elimination: Goal: Will not experience complications related to bowel motility Outcome: Progressing Goal: Will not experience complications related to urinary retention Outcome: Progressing   Problem: Pain Managment: Goal: General experience of comfort will improve Outcome: Progressing   Problem: Safety: Goal: Ability to remain free from injury will improve Outcome: Progressing   

## 2020-09-28 NOTE — Progress Notes (Signed)
Occupational Therapy Treatment Patient Details Name: Austin Davis MRN: [id number] DOB: [birth year] Today's Date: 09/28/2020    History of present illness 80 year old male never smoker with OSA non-adherent to therapy, hx mitral valve repair,chronic combined heart failure, atrial fibrillation, tremors, and general weakness who presents with acute hypercarbic respiratory failure requiring BiPAP. CT Chest with no evidence of infection or other causes to contribute to his dyspnea. The etiology of his chronic respiratory failure is unclear as he does not have evidence of emphysema or stigmata of OHS. Also question his diagnosis of OSA as his sleep study commented on >200 apnea and hypopneas however this was a home sleep study so unable to confirm if true obstructive apneas.  He does have significant failure to thrive and general weakness that is concerning for hypoventilation secondary to severe deconditioning/neuro?muscular weakness.   OT comments  Very limited treatment today due to patient's weakness and RR. Treatment focused on instruction of breathing techniques in order to control RR and improve activity tolerance. RR between 29-40 - fluctuating quickly. Pursed lipped breathing encouraged over and over to maximize retention of education. Patient's RR decreased with active technique but increased once he performed any movement. Patient repositioned in bed for comfort and patient able to assist with pulling himself up in bed with upper extremities. In improved position therapist had patient perform leg exercises (minimal) while also attempting to maintain breathing. Will continue to need reinforcement. Patient overall looks very fatigued and patient reports "I'm tired."   Follow Up Recommendations  SNF    Equipment Recommendations  None recommended by OT    Recommendations for Other Services      Precautions / Restrictions Precautions Precautions: Fall Precaution Comments: monitor sats,  RR Restrictions Weight Bearing Restrictions: No           Vision   Vision Assessment?: No apparent visual deficits   Perception     Praxis      Cognition Arousal/Alertness: Awake/alert Behavior During Therapy: WFL for tasks assessed/performed Overall Cognitive Status: Within Functional Limits for tasks assessed                                          Exercises Other Exercises Other Exercises: Leg lifts x 5 each leg   Shoulder Instructions       General Comments      Pertinent Vitals/ Pain       Pain Assessment: No/denies pain  Home Living                                          Prior Functioning/Environment              Frequency  Min 2X/week        Progress Toward Goals  OT Goals(current goals can now be found in the care plan section)  Progress towards OT goals: Not progressing toward goals - comment  Acute Rehab OT Goals Patient Stated Goal: to get better OT Goal Formulation: With patient Time For Goal Achievement: 10/03/20 Potential to Achieve Goals: Austin Davis Discharge plan remains appropriate    Co-evaluation                 AM-PAC OT "6 Clicks" Daily Activity     Outcome Measure   Help  from another person eating meals?: A Little Help from another person taking care of personal grooming?: A Little Help from another person toileting, which includes using toliet, bedpan, or urinal?: Total Help from another person bathing (including washing, rinsing, drying)?: A Lot Help from another person to put on and taking off regular upper body clothing?: A Lot Help from another person to put on and taking off regular lower body clothing?: A Lot 6 Click Score: 13    End of Session Equipment Utilized During Treatment: Oxygen  OT Visit Diagnosis: Muscle weakness (generalized) (M62.81);Unsteadiness on feet (R26.81)   Activity Tolerance Patient limited by fatigue   Patient Left in bed;with call  bell/phone within reach;with bed alarm set   Nurse Communication Other (comment) (okay to see)        Time: 6962-9528 OT Time Calculation (min): 16 min  Charges: OT General Charges $OT Visit: 1 Visit OT Treatments $Therapeutic Activity: 8-22 mins  Austin Davis, OTR/L [place]  Office [phone] Pager: [place] 09/28/2020, 12:47 PM

## 2020-09-28 NOTE — Progress Notes (Signed)
ANTICOAGULATION CONSULT NOTE - Follow Up Consult  Pharmacy Consult for warfarin Indication: atrial fibrillation  Allergies  Allergen Reactions   Prednisone Other (See Comments)    Makes skin crawl, rapid HR   Cortisone Palpitations   Patient Measurements: Height: 5\' 7"  (170.2 cm) Weight: 48.9 kg (107 lb 12.9 oz) IBW/kg (Calculated) : 66.1  Vital Signs: Temp: 98 F (36.7 C) (09/09 0600) Temp Source: Oral (09/09 0600) BP: 129/93 (09/09 0600) Pulse Rate: 92 (09/09 0600)  Labs: Recent Labs    09/26/20 0438 09/27/20 0428 09/28/20 0426  HGB 13.5 12.1* 12.0*  HCT 41.8 37.1* 35.4*  PLT 191 151 166  LABPROT 23.4* 27.4* 23.8*  INR 2.1* 2.6* 2.1*  CREATININE 0.54*  --   --    Estimated Creatinine Clearance: 51.8 mL/min (A) (by C-G formula based on SCr of 0.54 mg/dL (L)).  Home warfarin dosing unclear on med rec.  [facility] was contacted to clarify dosing.  Current regimen is 5mg  daily PLUS 1mg  once weekly. Unable to tell me what day of the week patient is supposed to take the 1mg , but said it was started on 08/09/20 due to DDI with primidone.  Assessment: 52 yoM admitted on 8/28 with acute hypoxic and hypercarbic respiratory failure. PMH significant for chronic warfarin anticoagulation, AFib, s/p mitral valve annuloplasty and MAZE 2005. Pharmacy is consulted to resume warfarin dosing on 8/30.  Significant events: Admit INR 10.9 and was given Vitamin K 2.5 mg PO x1 8/28 Heparin stopped 9/5 at 10am for blood noted in urine, continue Warfarin  Today, 09/28/2020: INR remains therapeutic but down some CBC: Hgb stable, Plt stable WNL No bleeding or complications reported by RN.   DDI: suspect that vitamin K will suppress INR for several days.  Note that patient was NOT taking primidone prior to admission.  Diet:  regular, charted 20-25% intake, but taking supplements  Goal of Therapy:  INR 2-3 Monitor platelets by anticoagulation protocol: Yes   Plan:  Repeat  warfarin 5 mg PO x 1 at 1600 Daily PT/INR CBC as needed while on warfarin   Adrian Saran, PharmD, BCPS Secure Chat if ?s 09/28/2020 9:16 AM

## 2020-09-28 NOTE — TOC Progression Note (Signed)
Transition of Care [facility]) - Progression Note    Patient Details  Name: Austin Davis MRN: [id number] Date of Birth: 1940/04/06  Transition of [facility] CM/SW Contact  Marwin Primmer, Juliann Pulse, RN Phone Number: 09/28/2020, 3:01 PM  Clinical Narrative: Noted d/c summary-spoke to dtr [name] who is still trying to discuss with her Dad(patient) about the LTAC for agreement-prefers [facility]-she needs a little more time to discuss with patient-she is currently @ work.     Expected Discharge Plan: Long Term Acute Care (LTAC) Barriers to Discharge: Continued Medical Work up  Expected Discharge Plan and Services Expected Discharge Plan: [place] (LTAC)   Discharge Planning Services: CM Consult Post Acute Care Choice: [place] Living arrangements for the past 2 months: Single Family Home Expected Discharge Date: 09/28/20                                     Social Determinants of Health (SDOH) Interventions    Readmission Risk Interventions No flowsheet data found.

## 2020-09-28 NOTE — Plan of Care (Signed)
  Problem: Education: Goal: Knowledge of General Education information will improve Description: Including pain rating scale, medication(s)/side effects and non-pharmacologic comfort measures Outcome: Progressing   Problem: Coping: Goal: Level of anxiety will decrease Outcome: Progressing   Problem: Elimination: Goal: Will not experience complications related to bowel motility Outcome: Progressing Goal: Will not experience complications related to urinary retention Outcome: Progressing   Problem: Pain Managment: Goal: General experience of comfort will improve Outcome: Progressing

## 2020-09-28 NOTE — Progress Notes (Signed)
Palliative Care Progress Note  [name], "Austin Davis" continues to be very fragile, message received re: hypotension. 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 He has not been able to tolerate getting an MRI. His son and significant other are at the bedside. He was on bipap when I arrived to his room-but able to converse. Requesting to have bipap removed. Taking in Ensure-but still not eating very well.  Goals of care have to date been for full scope medical interventions. Family and patient interested in knowing a diagnosis and prognosis and also on maximizing QOL. His significant other will not be able to care for him at home if he stabilizes.  [name] wasn't able to converse with me much on the bipap but he told me he felt the same and was "fine", "Im not worried" is what he told me and he is mostly asymptomatic with hypotension and tachypnea.  Symptoms: -respiratory muscle weakness leading to hypercarbia-especially in the afternoon and evening, dyspnea on exertion. -poor appetite dramatic unexplained weight loss-started on PM mirtazapine for depression and appetite stimulation several days ago-intake did improve. Metallic taste persists.Very dry mouth. -possible autonomic dysfunction-hypotension intermittent, tachycardia/vtach episodes -weak shaky voice -issues with double vision and vision changes -mild pain in his back from previous fall and T6-T12 compression fractures but doesn't require pain medicatuion on a regular basis. He had a contusion on his right rib cage unexplained-probable fall at home prior to admission.  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 He has improved since admission but remains unstable and fragile. Goals have been for full scope tx but if he  declines more he would elect for comfort care-he has been asking for help getting his affairs in order-especially his will and HCPOA paperwork.   Recommendations: No CPR or Intubation, avoid ICU Neurology Consultation may help family and patient in terms of diagnosis and prognostication. Trial of steroids Continue Mirtazapine for depression and appetite MMW for drymouth Tylenol for pain, can use low dose opioids if needed but may require bipap. Treat constipation.   I spoke with patient's daughter [name] at length by phone- she understands that her dad may not improve much beyond his current state-she would lie to talk to him about his wishes in this situation while he is still able to communicate those things to her.  Lane Hacker, DO Palliative Medicine

## 2020-09-28 NOTE — Discharge Summary (Signed)
Physician Discharge Summary  Austin Davis DJM:[id number] DOB: 10-17-1940 DOA: 09/16/2020  PCP: [name], MD  Admit date: 09/16/2020 Discharge date: 09/28/2020  Admitted From: Home Disposition: LTAC  Recommendations for Outpatient Follow-up:  Follow up with PCP in 1-2 weeks Please obtain BMP/CBC in one week  Discharge Condition: Guarded CODE STATUS: Partial, BiPAP/ACLS medications only; no intubation, no CPR Diet recommendation:    Brief/Interim Summary: 80 year old male with a history of paroxysmal atrial fibrillation on anticoagulation, essential tremor, obstructive sleep apnea on CPAP, who has had a progressive decline in his health, weight loss over the last year due to decreased p.o. intake, presented with worsening shortness of breath.  He was noted to be tachycardic, hypotensive and hypoxic on arrival.  Initial ABG showed respiratory acidosis with a PCO2 of 90.  He was started on BiPAP.  INR was noted to be 10.9 and he was given vitamin K in the emergency room. Chest x-ray did not show any evidence of infiltrates.  Assessment & Plan:   Goals of care - Multiple rapid responses over the past 48 hours in the setting of acute mental status changes, hypoxia, dyspnea as well as hypertension - Patient continues to have abysmally poor p.o. intake both with fluids and nutrition. - Palliative care following, continues as partial code with DO NOT INTUBATE or perform CPR orders most notably. -Transition to LTAC for ongoing respiratory therapy and physical therapy, continue to push nutrition, patient remains appropriate for further discussion with palliative care and hospice should he continue to decline but in the interim appears to be stabilizing today and will hopefully continue to improve with increased p.o. intake   Acute on chronic respiratory failure with hypoxia and hypercapnia - Patient has a reported history of obstructive sleep apnea and was recently set up with CPAP in  07/2020.  He reportedly had difficulty tolerating this and was using it infrequently - No clear etiology for acute respiratory failure at this point, imaging is unremarkable for overt findings, continue BiPAP/supportive care as indicated as well as at night.  Pulmonology previously evaluated recommending home Trelegy - Likely exacerbated by patient's malnutrition, poor p.o. intake and general debility   Acute on Chronic combined CHF resolved - Ejection fraction 45 to 50% with grade 1 diastolic dysfunction - BNP elevated at 876 at intake, diuresed well, likely not a major player in respiratory symptoms as above at this point -Continue to follow volume status closely, currently off diuretics in the setting of poor p.o. intake and likely dehydration   Failure to thrive/generalized weakness/severe malnutrition - Unclear etiology, continue steroid taper over the next week. - Continue to encourage increased p.o. intake given ongoing malnutrition is likely major player in patient's ongoing weakness and debility - Questionably underlying neurodegenerative disease but patient currently refusing MRI due to claustrophobia and orthopnea, followed outpatient previously with outpatient neurology with no MRI at that time, unclear if MRI which really alter patient's medical course at this point.  He remains without focal deficits.   Dysphagia - Speech therapy following -continue to advance diet as tolerated - Likely secondary to above malnourished state and generalized weakness/debility    Paroxysmal atrial fibrillation SVT - Continue Cardizem as tolerated - Continue Coumadin - Follow-up with cardiology as an outpatient   Essential tremor, chronic - He is followed by [facility] - Intolerant of beta-blockers in the past - Uses benzos as needed   Severe protein calorie malnutrition -BMI 16.2 -Nutrition following -Likely the crux of his debility at  this point  Discharge  Instructions  Discharge Instructions     Diet - low sodium heart healthy   Complete by: As directed    Discharge wound care:   Complete by: As directed    Dressing changes daily as indicated   Increase activity slowly   Complete by: As directed       Allergies as of 09/28/2020       Reactions   Prednisone Other (See Comments)   Makes skin crawl, rapid HR   Cortisone Palpitations        Medication List     STOP taking these medications    diphenhydramine-acetaminophen 25-500 MG Tabs tablet Commonly known as: TYLENOL PM   Garlic 408 MG Tabs   primidone 50 MG tablet Commonly known as: MYSOLINE       TAKE these medications    acetaminophen 500 MG tablet Commonly known as: TYLENOL Take 500 mg by mouth every 6 (six) hours as needed for mild pain or headache.   ALPRAZolam 0.25 MG tablet Commonly known as: XANAX Take 0.125 mg by mouth at bedtime as needed for anxiety.   D3 50 MCG (2000 UT) Tabs Generic drug: Cholecalciferol Take 2,000 Units by mouth daily.   diltiazem 30 MG tablet Commonly known as: CARDIZEM Take 1 tablet (30 mg total) by mouth every 12 (twelve) hours.   feeding supplement Liqd Take 237 mLs by mouth 2 (two) times daily between meals.   Ensure Max Protein Liqd Take 330 mLs (11 oz total) by mouth daily.   FISH OIL PO Take 1 capsule by mouth daily.   folic acid 1 MG tablet Commonly known as: FOLVITE Take 1 tablet (1 mg total) by mouth daily. Start taking on: September 29, 2020   Magnesium 250 MG Tabs Take 250 mg by mouth daily.   midodrine 5 MG tablet Commonly known as: PROAMATINE Take 1 tablet (5 mg total) by mouth 3 (three) times daily with meals.   mirtazapine 7.5 MG tablet Commonly known as: REMERON Take 1 tablet (7.5 mg total) by mouth at bedtime.   multivitamin with minerals Tabs tablet Take 1 tablet by mouth daily. Start taking on: September 29, 2020   pantoprazole 40 MG tablet Commonly known as: PROTONIX Take 1  tablet (40 mg total) by mouth 2 (two) times daily.   predniSONE 10 MG tablet Commonly known as: DELTASONE Take 4 tablets (40 mg total) by mouth daily for 3 days, THEN 3 tablets (30 mg total) daily for 3 days, THEN 2 tablets (20 mg total) daily for 3 days, THEN 1 tablet (10 mg total) daily for 3 days. Start taking on: September 28, 2020   senna-docusate 8.6-50 MG tablet Commonly known as: Senokot-S Take 1 tablet by mouth 2 (two) times daily.   VITAMIN B COMPLEX PO Take 1 tablet by mouth daily.   VITAMIN C IMMUNE HEALTH PO Take 500 mg by mouth.   warfarin 1 MG tablet Commonly known as: COUMADIN Take 1 mg by mouth daily. What changed: Another medication with the same name was changed. Make sure you understand how and when to take each.   warfarin 5 MG tablet Commonly known as: COUMADIN TAKE 1/2 TO 1 TABLET DAILY AS DIRECTED BY COUMADIN CLINIC. What changed: See the new instructions.               Durable Medical Equipment  (From admission, onward)           Start     Ordered  09/19/20 1127  For home use only DME Ventilator  Once       Comments: Trilogy vent  Question:  Length of Need  Answer:  12 Months   09/19/20 1127              Discharge Care Instructions  (From admission, onward)           Start     Ordered   09/28/20 0000  Discharge wound care:       Comments: Dressing changes daily as indicated   09/28/20 1256            Follow-up Information     Hunsucker, Bonna Gains, MD Follow up.   Specialty: Pulmonary Disease Why: Call our office when you leave the hospital to schedule appointment Contact information: [address] [place] [address] [phone]                Allergies  Allergen Reactions   Prednisone Other (See Comments)    Makes skin crawl, rapid HR   Cortisone Palpitations    Consultations: Palliative care, pulm/critical care   Procedures/Studies: DG Chest 2 View  Result Date:  09/16/2020 CLINICAL DATA:  sob EXAM: CHEST - 2 VIEW COMPARISON:  June 30, 2020 FINDINGS: The cardiomediastinal silhouette is unchanged in contour.Tortuous thoracic aorta. Status post median sternotomy. No pleural effusion. No pneumothorax. No acute pleuroparenchymal abnormality. Visualized abdomen is unremarkable. Compression fracture deformity of the mid to inferior thoracic spine, unchanged. IMPRESSION: No acute cardiopulmonary abnormality. Electronically Signed   By: Valentino Saxon M.D.   On: 09/16/2020 15:35   CT HEAD WO CONTRAST (5MM)  Result Date: 09/21/2020 CLINICAL DATA:  Vertigo, central. EXAM: CT HEAD WITHOUT CONTRAST TECHNIQUE: Contiguous axial images were obtained from the base of the skull through the vertex without intravenous contrast. COMPARISON:  None. FINDINGS: Brain: Mild generalized cerebral and cerebellar atrophy. Mild patchy and ill-defined hypoattenuation within the cerebral white matter, nonspecific but compatible with chronic small vessel ischemic disease. There is no acute intracranial hemorrhage. No demarcated cortical infarct. No extra-axial fluid collection. No evidence of an intracranial mass. No midline shift. Vascular: No hyperdense vessel.  Atherosclerotic calcifications Skull: Normal. Negative for fracture or focal lesion. Sinuses/Orbits: Visualized orbits show no acute finding. No significant paranasal sinus disease at the imaged levels. IMPRESSION: No evidence of acute intracranial abnormality. Mild chronic small-vessel ischemic changes within the cerebral white matter. Mild generalized parenchymal atrophy. Electronically Signed   By: Kellie Simmering D.O.   On: 09/21/2020 14:09   CT CHEST WO CONTRAST  Result Date: 09/18/2020 CLINICAL DATA:  Respiratory failure and uncontrollable tremors. EXAM: CT CHEST WITHOUT CONTRAST TECHNIQUE: Multidetector CT imaging of the chest was performed following the standard protocol without IV contrast. COMPARISON:  None. FINDINGS:  Cardiovascular: There is mild calcification of the aortic arch. Normal heart size with moderate to marked severity coronary artery calcification. No pericardial effusion. Mediastinum/Nodes: No enlarged mediastinal or axillary lymph nodes. Thyroid gland, trachea, and esophagus demonstrate no significant findings. Lungs/Pleura: Mild scarring and/or atelectasis is seen within the bilateral apices and posterior aspects of the bilateral lung bases. Small bilateral pleural effusions are noted. No pneumothorax is identified. Upper Abdomen: No acute abnormality. Musculoskeletal: Multiple sternal wires are present. A chronic compression fracture deformity is seen at the level of T6 and T10. IMPRESSION: 1. Small bilateral pleural effusions. 2. Mild biapical and mild bibasilar scarring and/or atelectasis. 3. Moderate to marked severity coronary artery calcification. 4. Chronic compression fracture deformity at  the level of T6 and T10. 5. Aortic atherosclerosis. Aortic Atherosclerosis (ICD10-I70.0). Electronically Signed   By: Virgina Norfolk M.D.   On: 09/18/2020 20:05   CT ABDOMEN PELVIS W CONTRAST  Result Date: 09/22/2020 CLINICAL DATA:  Unintentional weight loss, unable to eat or drink EXAM: CT ABDOMEN AND PELVIS WITH CONTRAST TECHNIQUE: Multidetector CT imaging of the abdomen and pelvis was performed using the standard protocol following bolus administration of intravenous contrast. CONTRAST:  23mL OMNIPAQUE IOHEXOL 350 MG/ML SOLN COMPARISON:  05/10/2020 FINDINGS: Lower chest: Median sternotomy has been performed. Cardiac size within normal limits. No pericardial effusion. Small bilateral pleural effusions have developed with associated bibasilar compressive atelectasis. Hepatobiliary: Tiny scattered cysts are seen within the liver, stable since prior examination. The liver is otherwise unremarkable. No intra or extrahepatic biliary ductal dilation. Gallbladder unremarkable. Pancreas: Unremarkable Spleen: Unremarkable  Adrenals/Urinary Tract: The adrenal glands are unremarkable. The kidneys are normal. Foley catheter balloon is seen within a decompressed bladder lumen. Previously noted calculi within the bladder lumen has been largely evacuated though a single 1-2 mm calculus remains within the dependent right bladder lumen. Stomach/Bowel: Moderate stool noted throughout the colon. Large volume stool noted within the rectal vault. No definite evidence of obstruction. The stomach, small bowel, and large bowel are otherwise unremarkable. Appendix is normal. No free intraperitoneal gas or fluid. Vascular/Lymphatic: Mild aortoiliac atherosclerotic calcification. No aortic aneurysm. No pathologic adenopathy within the abdomen and pelvis. Reproductive: Mild prostatic enlargement is stable. Other: No abdominal wall hernia. Interval development of mild subcutaneous edema within the abdominal wall, particularly within the flanks bilaterally. Musculoskeletal: No acute bone abnormality. Osseous structures are diffusely osteopenic. Chronic appearing T10 compression fracture noted. No lytic or blastic bone lesions noted. IMPRESSION: Interval development of small bilateral pleural effusions and diffuse body wall subcutaneous edema in keeping with mild anasarca. Large volume stool within the rectal vault. Clinical correlation for fecal impaction may be helpful. Superimposed moderate stool throughout the colon without evidence of obstruction. Minimal residual bladder calculi. Interval Foley decompression of the bladder. Aortic Atherosclerosis (ICD10-I70.0). Electronically Signed   By: Fidela Salisbury M.D.   On: 09/22/2020 20:41   ECHOCARDIOGRAM COMPLETE  Result Date: 09/17/2020    ECHOCARDIOGRAM REPORT   Patient Name:   Austin Davis Date of Exam: 09/17/2020 Medical Rec #:  [id number]        Height:       67.0 in Accession #:    [id number]       Weight:       100.0 lb Date of Birth:  1940-02-24       BSA:          1.506 m Patient Age:    62  years         BP:           149/130 mmHg Patient Gender: M                HR:           94 bpm. Exam Location:  Inpatient Procedure: 2D Echo, 3D Echo, Cardiac Doppler and Color Doppler Indications:    R06.02 SOB  History:        Patient has prior history of Echocardiogram examinations, most                 recent 10/14/2019. Abnormal ECG, Mitral Valve Disease,                 Arrythmias:Tachycardia and Atrial Fibrillation,  Signs/Symptoms:Hypotension, Shortness of Breath and Dyspnea;                 Risk Factors:Sleep Apnea and Dyslipidemia. Maze procedure.                  Mitral Valve: prosthetic annuloplasty ring valve is present in                 the mitral position. Procedure Date: 2005.  Sonographer:    Roseanna Rainbow RDCS Referring Phys: [id number] [place] T T[facility]  Sonographer Comments: Technically difficult study due to poor echo windows and no subcostal window. Patient has extremely thin body habitus and very high window. IMPRESSIONS  1. Left ventricular ejection fraction, by estimation, is 45 to 50%. The left ventricle has mildly decreased function. The left ventricle demonstrates regional wall motion abnormalities with mid to apical inferoseptal hypokinesis, inferior hypokinesis, and inferolateral hypokinesis. There is mild left ventricular hypertrophy. Left ventricular diastolic parameters are consistent with Grade I diastolic dysfunction (impaired relaxation).  2. Right ventricular systolic function is mildly reduced. The right ventricular size is normal. Tricuspid regurgitation signal is inadequate for assessing PA pressure.  3. Left atrial size was mildly dilated.  4. Status post mitral valve repair. Mean gradient 3 mmHg, no signicant stenosis. No significant regurgitation noted.  5. The aortic valve is tricuspid. Aortic valve regurgitation is trivial. Mild aortic valve sclerosis is present, with no evidence of aortic valve stenosis.  6. Aortic dilatation noted. There is mild dilatation of the  ascending aorta, measuring 41 mm.  7. Technically difficult study with poor acoustic windows. FINDINGS  Left Ventricle: Left ventricular ejection fraction, by estimation, is 45 to 50%. The left ventricle has mildly decreased function. The left ventricle demonstrates regional wall motion abnormalities. The left ventricular internal cavity size was normal in size. There is mild left ventricular hypertrophy. Left ventricular diastolic parameters are consistent with Grade I diastolic dysfunction (impaired relaxation). Right Ventricle: The right ventricular size is normal. No increase in right ventricular wall thickness. Right ventricular systolic function is mildly reduced. Tricuspid regurgitation signal is inadequate for assessing PA pressure. Left Atrium: Left atrial size was mildly dilated. Right Atrium: Right atrial size was normal in size. Pericardium: There is no evidence of pericardial effusion. Mitral Valve: Status post mitral valve repair. Mean gradient 3 mmHg, no signicant stenosis. No significant regurgitation noted. The mitral valve has been repaired/replaced. No evidence of mitral valve regurgitation. There is a prosthetic annuloplasty ring present in the mitral position. Procedure Date: 2005. MV peak gradient, 7.3 mmHg. The mean mitral valve gradient is 3.0 mmHg. Tricuspid Valve: The tricuspid valve is normal in structure. Tricuspid valve regurgitation is trivial. Aortic Valve: The aortic valve is tricuspid. Aortic valve regurgitation is trivial. Mild aortic valve sclerosis is present, with no evidence of aortic valve stenosis. Pulmonic Valve: The pulmonic valve was normal in structure. Pulmonic valve regurgitation is not visualized. Aorta: Aortic dilatation noted. There is mild dilatation of the ascending aorta, measuring 41 mm. Venous: The inferior vena cava was not well visualized. IAS/Shunts: No atrial level shunt detected by color flow Doppler.  LEFT VENTRICLE PLAX 2D LVIDd:         3.40 cm       Diastology LVIDs:         2.60 cm      LV e' medial:    6.85 cm/s LV PW:         1.25 cm      LV E/e'  medial:  19.6 LV IVS:        1.40 cm      LV e' lateral:   8.16 cm/s LVOT diam:     2.00 cm      LV E/e' lateral: 16.4 LV SV:         45 LV SV Index:   30 LVOT Area:     3.14 cm  LV Volumes (MOD) LV vol d, MOD A2C: 115.0 ml LV vol d, MOD A4C: 66.2 ml LV vol s, MOD A2C: 58.0 ml LV vol s, MOD A4C: 37.5 ml LV SV MOD A2C:     57.0 ml LV SV MOD A4C:     66.2 ml LV SV MOD BP:      42.0 ml RIGHT VENTRICLE RV S prime:     9.14 cm/s TAPSE (M-mode): 1.0 cm LEFT ATRIUM             Index       RIGHT ATRIUM          Index LA diam:        3.50 cm 2.32 cm/m  RA Area:     9.97 cm LA Vol (A2C):   49.5 ml 32.86 ml/m RA Volume:   19.40 ml 12.88 ml/m LA Vol (A4C):   20.3 ml 13.48 ml/m LA Biplane Vol: 30.9 ml 20.51 ml/m  AORTIC VALVE LVOT Vmax:   105.00 cm/s LVOT Vmean:  65.500 cm/s LVOT VTI:    0.142 m  AORTA Ao Root diam: 3.95 cm Ao Asc diam:  4.10 cm MITRAL VALVE MV Area (PHT): 5.02 cm     SHUNTS MV Area VTI:   1.08 cm     Systemic VTI:  0.14 m MV Peak grad:  7.3 mmHg     Systemic Diam: 2.00 cm MV Mean grad:  3.0 mmHg MV Vmax:       1.35 m/s MV Vmean:      75.7 cm/s MV Decel Time: 151 msec MV E velocity: 134.00 cm/s MV A velocity: 130.00 cm/s MV E/A ratio:  1.03 Dalton McleanMD Electronically signed by Franki Monte Signature Date/Time: 09/17/2020/3:55:31 PM    Final      Subjective: No acute issues or events overnight, somewhat more awake alert oriented this morning appears to be back to baseline mental status at that he did tolerate breakfast at bedside without any overt difficulty.  Denies nausea vomiting diarrhea constipation headache fevers chills chest pain -continues to report orthopnea but markedly improving   Discharge Exam: Vitals:   09/28/20 0600 09/28/20 1233  BP: (!) 129/93 (!) 147/99  Pulse: 92   Resp: 19   Temp: 98 F (36.7 C) 98 F (36.7 C)  SpO2: 94%    Vitals:   09/28/20 0100 09/28/20  0400 09/28/20 0600 09/28/20 1233  BP: 95/73 103/75 (!) 129/93 (!) 147/99  Pulse: 77 72 92   Resp: 16 17 19    Temp: 97.8 F (36.6 C)  98 F (36.7 C) 98 F (36.7 C)  TempSrc: Axillary  Oral Oral  SpO2: 97% 100% 94%   Weight:      Height:        General: Pt is alert, awake, not in acute distress Cardiovascular: S1/S2 +, no rubs, no gallops Respiratory: CTA bilaterally, no wheezing, no rhonchi Abdominal: Soft, NT, ND, bowel sounds + Extremities: no edema, no cyanosis    The results of significant diagnostics from this hospitalization (including imaging, microbiology, ancillary and laboratory) are listed below for reference.  Microbiology: No results found for this or any previous visit (from the past 240 hour(s)).   Labs: BNP (last 3 results) Recent Labs    09/19/20 1606  BNP 481.8*   Basic Metabolic Panel: Recent Labs  Lab 09/23/20 0259 09/24/20 0418 09/25/20 0241 09/26/20 0438  NA 138 139 137 138  K 4.1 3.7 3.7 3.9  CL 91* 93* 94* 92*  CO2 37* 39* 35* 40*  GLUCOSE 91 116* 110* 104*  BUN 23 19 18 18   CREATININE 0.50* 0.46* 0.46* 0.54*  CALCIUM 8.5* 8.7* 8.5* 8.7*  PHOS 2.0* 2.2* 3.7  --    Liver Function Tests: Recent Labs  Lab 09/23/20 0259 09/24/20 0418 09/25/20 0241  ALBUMIN 3.1* 3.2* 3.2*   No results for input(s): LIPASE, AMYLASE in the last 168 hours. No results for input(s): AMMONIA in the last 168 hours. CBC: Recent Labs  Lab 09/24/20 0418 09/25/20 0241 09/26/20 0438 09/27/20 0428 09/28/20 0426  WBC 7.0 7.6 8.3 6.4 5.6  HGB 13.5 13.3 13.5 12.1* 12.0*  HCT 42.1 41.0 41.8 37.1* 35.4*  MCV 99.3 98.8 99.8 98.1 94.9  PLT 156 167 191 151 166   Cardiac Enzymes: Recent Labs  Lab 09/22/20 0208  CKTOTAL 26*   BNP: Invalid input(s): POCBNP CBG: No results for input(s): GLUCAP in the last 168 hours. D-Dimer No results for input(s): DDIMER in the last 72 hours. Hgb A1c No results for input(s): HGBA1C in the last 72 hours. Lipid  Profile No results for input(s): CHOL, HDL, LDLCALC, TRIG, CHOLHDL, LDLDIRECT in the last 72 hours. Thyroid function studies No results for input(s): TSH, T4TOTAL, T3FREE, THYROIDAB in the last 72 hours.  Invalid input(s): FREET3 Anemia work up No results for input(s): VITAMINB12, FOLATE, FERRITIN, TIBC, IRON, RETICCTPCT in the last 72 hours. Urinalysis    Component Value Date/Time   COLORURINE YELLOW 09/17/2020 0340   APPEARANCEUR HAZY (A) 09/17/2020 0340   LABSPEC 1.019 09/17/2020 0340   PHURINE 5.0 09/17/2020 0340   GLUCOSEU NEGATIVE 09/17/2020 0340   HGBUR LARGE (A) 09/17/2020 0340   BILIRUBINUR NEGATIVE 09/17/2020 0340   KETONESUR 20 (A) 09/17/2020 0340   PROTEINUR NEGATIVE 09/17/2020 0340   UROBILINOGEN 0.2 09/25/2006 1500   NITRITE NEGATIVE 09/17/2020 0340   LEUKOCYTESUR NEGATIVE 09/17/2020 0340   Sepsis Labs Invalid input(s): PROCALCITONIN,  WBC,  LACTICIDVEN Microbiology No results found for this or any previous visit (from the past 240 hour(s)).   Time coordinating discharge: Over 30 minutes  SIGNED:   [name], DO [facility] 09/28/2020, 12:56 PM Pager   If 7PM-7AM, please contact night-coverage [email or web address]

## 2020-09-29 ENCOUNTER — Inpatient Hospital Stay
Admission: EM | Admit: 2020-09-29 | Discharge: 2020-11-14 | Disposition: A | Discharge status: Skilled Nursing Facility | Payer: Medicare Other | Source: Other Acute Inpatient Hospital | Attending: Internal Medicine | Admitting: Internal Medicine

## 2020-09-29 DIAGNOSIS — J9690 Respiratory failure, unspecified, unspecified whether with hypoxia or hypercapnia: Secondary | ICD-10-CM | POA: Diagnosis not present

## 2020-09-29 DIAGNOSIS — Z681 Body mass index (BMI) 19 or less, adult: Secondary | ICD-10-CM | POA: Diagnosis not present

## 2020-09-29 DIAGNOSIS — J189 Pneumonia, unspecified organism: Secondary | ICD-10-CM | POA: Diagnosis not present

## 2020-09-29 DIAGNOSIS — F419 Anxiety disorder, unspecified: Secondary | ICD-10-CM | POA: Diagnosis not present

## 2020-09-29 DIAGNOSIS — Z8701 Personal history of pneumonia (recurrent): Secondary | ICD-10-CM | POA: Diagnosis not present

## 2020-09-29 DIAGNOSIS — I4891 Unspecified atrial fibrillation: Secondary | ICD-10-CM | POA: Diagnosis not present

## 2020-09-29 DIAGNOSIS — J449 Chronic obstructive pulmonary disease, unspecified: Secondary | ICD-10-CM

## 2020-09-29 DIAGNOSIS — J984 Other disorders of lung: Secondary | ICD-10-CM | POA: Diagnosis not present

## 2020-09-29 DIAGNOSIS — I480 Paroxysmal atrial fibrillation: Secondary | ICD-10-CM | POA: Diagnosis present

## 2020-09-29 DIAGNOSIS — R0602 Shortness of breath: Secondary | ICD-10-CM | POA: Diagnosis not present

## 2020-09-29 DIAGNOSIS — R1310 Dysphagia, unspecified: Secondary | ICD-10-CM | POA: Diagnosis present

## 2020-09-29 DIAGNOSIS — E43 Unspecified severe protein-calorie malnutrition: Secondary | ICD-10-CM | POA: Diagnosis present

## 2020-09-29 DIAGNOSIS — I5043 Acute on chronic combined systolic (congestive) and diastolic (congestive) heart failure: Secondary | ICD-10-CM | POA: Diagnosis present

## 2020-09-29 DIAGNOSIS — G250 Essential tremor: Secondary | ICD-10-CM | POA: Diagnosis present

## 2020-09-29 DIAGNOSIS — R2231 Localized swelling, mass and lump, right upper limb: Secondary | ICD-10-CM | POA: Diagnosis not present

## 2020-09-29 DIAGNOSIS — Z930 Tracheostomy status: Secondary | ICD-10-CM | POA: Diagnosis not present

## 2020-09-29 DIAGNOSIS — M6281 Muscle weakness (generalized): Secondary | ICD-10-CM | POA: Diagnosis not present

## 2020-09-29 DIAGNOSIS — I5032 Chronic diastolic (congestive) heart failure: Secondary | ICD-10-CM

## 2020-09-29 DIAGNOSIS — Z431 Encounter for attention to gastrostomy: Secondary | ICD-10-CM | POA: Diagnosis not present

## 2020-09-29 DIAGNOSIS — I5033 Acute on chronic diastolic (congestive) heart failure: Secondary | ICD-10-CM | POA: Diagnosis not present

## 2020-09-29 DIAGNOSIS — J9811 Atelectasis: Secondary | ICD-10-CM | POA: Diagnosis not present

## 2020-09-29 DIAGNOSIS — E46 Unspecified protein-calorie malnutrition: Secondary | ICD-10-CM | POA: Diagnosis not present

## 2020-09-29 DIAGNOSIS — E871 Hypo-osmolality and hyponatremia: Secondary | ICD-10-CM | POA: Diagnosis not present

## 2020-09-29 DIAGNOSIS — T7840XD Allergy, unspecified, subsequent encounter: Secondary | ICD-10-CM | POA: Diagnosis not present

## 2020-09-29 DIAGNOSIS — Z6823 Body mass index (BMI) 23.0-23.9, adult: Secondary | ICD-10-CM | POA: Diagnosis not present

## 2020-09-29 DIAGNOSIS — G4733 Obstructive sleep apnea (adult) (pediatric): Secondary | ICD-10-CM | POA: Diagnosis not present

## 2020-09-29 DIAGNOSIS — Z8709 Personal history of other diseases of the respiratory system: Secondary | ICD-10-CM | POA: Diagnosis not present

## 2020-09-29 DIAGNOSIS — K567 Ileus, unspecified: Secondary | ICD-10-CM | POA: Diagnosis not present

## 2020-09-29 DIAGNOSIS — Z87440 Personal history of urinary (tract) infections: Secondary | ICD-10-CM | POA: Diagnosis not present

## 2020-09-29 DIAGNOSIS — H1131 Conjunctival hemorrhage, right eye: Secondary | ICD-10-CM | POA: Diagnosis not present

## 2020-09-29 DIAGNOSIS — T17908A Unspecified foreign body in respiratory tract, part unspecified causing other injury, initial encounter: Secondary | ICD-10-CM

## 2020-09-29 DIAGNOSIS — E569 Vitamin deficiency, unspecified: Secondary | ICD-10-CM | POA: Diagnosis not present

## 2020-09-29 DIAGNOSIS — J9692 Respiratory failure, unspecified with hypercapnia: Secondary | ICD-10-CM | POA: Diagnosis not present

## 2020-09-29 DIAGNOSIS — I5023 Acute on chronic systolic (congestive) heart failure: Secondary | ICD-10-CM | POA: Diagnosis not present

## 2020-09-29 DIAGNOSIS — N3289 Other specified disorders of bladder: Secondary | ICD-10-CM | POA: Diagnosis not present

## 2020-09-29 DIAGNOSIS — E861 Hypovolemia: Secondary | ICD-10-CM | POA: Diagnosis not present

## 2020-09-29 DIAGNOSIS — E870 Hyperosmolality and hypernatremia: Secondary | ICD-10-CM | POA: Diagnosis not present

## 2020-09-29 DIAGNOSIS — B952 Enterococcus as the cause of diseases classified elsewhere: Secondary | ICD-10-CM | POA: Diagnosis not present

## 2020-09-29 DIAGNOSIS — R6521 Severe sepsis with septic shock: Secondary | ICD-10-CM | POA: Diagnosis not present

## 2020-09-29 DIAGNOSIS — Z808 Family history of malignant neoplasm of other organs or systems: Secondary | ICD-10-CM | POA: Diagnosis not present

## 2020-09-29 DIAGNOSIS — Z9911 Dependence on respirator [ventilator] status: Secondary | ICD-10-CM | POA: Diagnosis not present

## 2020-09-29 DIAGNOSIS — M419 Scoliosis, unspecified: Secondary | ICD-10-CM | POA: Diagnosis not present

## 2020-09-29 DIAGNOSIS — Z7901 Long term (current) use of anticoagulants: Secondary | ICD-10-CM | POA: Diagnosis not present

## 2020-09-29 DIAGNOSIS — E039 Hypothyroidism, unspecified: Secondary | ICD-10-CM | POA: Diagnosis not present

## 2020-09-29 DIAGNOSIS — G9341 Metabolic encephalopathy: Secondary | ICD-10-CM | POA: Diagnosis not present

## 2020-09-29 DIAGNOSIS — F40240 Claustrophobia: Secondary | ICD-10-CM | POA: Diagnosis not present

## 2020-09-29 DIAGNOSIS — I959 Hypotension, unspecified: Secondary | ICD-10-CM | POA: Diagnosis not present

## 2020-09-29 DIAGNOSIS — J9620 Acute and chronic respiratory failure, unspecified whether with hypoxia or hypercapnia: Secondary | ICD-10-CM | POA: Diagnosis not present

## 2020-09-29 DIAGNOSIS — J690 Pneumonitis due to inhalation of food and vomit: Secondary | ICD-10-CM | POA: Diagnosis not present

## 2020-09-29 DIAGNOSIS — E872 Acidosis: Secondary | ICD-10-CM | POA: Diagnosis not present

## 2020-09-29 DIAGNOSIS — R791 Abnormal coagulation profile: Secondary | ICD-10-CM | POA: Diagnosis not present

## 2020-09-29 DIAGNOSIS — Z8249 Family history of ischemic heart disease and other diseases of the circulatory system: Secondary | ICD-10-CM | POA: Diagnosis not present

## 2020-09-29 DIAGNOSIS — Z4682 Encounter for fitting and adjustment of non-vascular catheter: Secondary | ICD-10-CM | POA: Diagnosis not present

## 2020-09-29 DIAGNOSIS — K6389 Other specified diseases of intestine: Secondary | ICD-10-CM | POA: Diagnosis not present

## 2020-09-29 DIAGNOSIS — X58XXXA Exposure to other specified factors, initial encounter: Secondary | ICD-10-CM | POA: Diagnosis not present

## 2020-09-29 DIAGNOSIS — S022XXA Fracture of nasal bones, initial encounter for closed fracture: Secondary | ICD-10-CM | POA: Diagnosis not present

## 2020-09-29 DIAGNOSIS — R059 Cough, unspecified: Secondary | ICD-10-CM | POA: Diagnosis not present

## 2020-09-29 DIAGNOSIS — K219 Gastro-esophageal reflux disease without esophagitis: Secondary | ICD-10-CM | POA: Diagnosis not present

## 2020-09-29 DIAGNOSIS — R531 Weakness: Secondary | ICD-10-CM | POA: Diagnosis present

## 2020-09-29 DIAGNOSIS — J90 Pleural effusion, not elsewhere classified: Secondary | ICD-10-CM | POA: Diagnosis not present

## 2020-09-29 DIAGNOSIS — Z4659 Encounter for fitting and adjustment of other gastrointestinal appliance and device: Secondary | ICD-10-CM

## 2020-09-29 DIAGNOSIS — I700 Atherosclerosis of aorta: Secondary | ICD-10-CM | POA: Diagnosis not present

## 2020-09-29 DIAGNOSIS — R0902 Hypoxemia: Secondary | ICD-10-CM | POA: Diagnosis not present

## 2020-09-29 DIAGNOSIS — R140 Abdominal distension (gaseous): Secondary | ICD-10-CM

## 2020-09-29 DIAGNOSIS — D72829 Elevated white blood cell count, unspecified: Secondary | ICD-10-CM | POA: Diagnosis not present

## 2020-09-29 DIAGNOSIS — I471 Supraventricular tachycardia: Secondary | ICD-10-CM | POA: Diagnosis not present

## 2020-09-29 DIAGNOSIS — R0603 Acute respiratory distress: Secondary | ICD-10-CM

## 2020-09-29 DIAGNOSIS — L89151 Pressure ulcer of sacral region, stage 1: Secondary | ICD-10-CM | POA: Diagnosis not present

## 2020-09-29 DIAGNOSIS — Z1621 Resistance to vancomycin: Secondary | ICD-10-CM | POA: Diagnosis not present

## 2020-09-29 DIAGNOSIS — M7989 Other specified soft tissue disorders: Secondary | ICD-10-CM | POA: Diagnosis not present

## 2020-09-29 DIAGNOSIS — Z20822 Contact with and (suspected) exposure to covid-19: Secondary | ICD-10-CM | POA: Diagnosis not present

## 2020-09-29 DIAGNOSIS — Z430 Encounter for attention to tracheostomy: Secondary | ICD-10-CM | POA: Diagnosis not present

## 2020-09-29 DIAGNOSIS — I6782 Cerebral ischemia: Secondary | ICD-10-CM | POA: Diagnosis not present

## 2020-09-29 DIAGNOSIS — R627 Adult failure to thrive: Secondary | ICD-10-CM | POA: Diagnosis present

## 2020-09-29 DIAGNOSIS — D649 Anemia, unspecified: Secondary | ICD-10-CM | POA: Diagnosis not present

## 2020-09-29 DIAGNOSIS — R64 Cachexia: Secondary | ICD-10-CM | POA: Diagnosis not present

## 2020-09-29 DIAGNOSIS — R5381 Other malaise: Secondary | ICD-10-CM | POA: Diagnosis present

## 2020-09-29 DIAGNOSIS — J9621 Acute and chronic respiratory failure with hypoxia: Secondary | ICD-10-CM | POA: Diagnosis not present

## 2020-09-29 DIAGNOSIS — J9622 Acute and chronic respiratory failure with hypercapnia: Secondary | ICD-10-CM | POA: Diagnosis not present

## 2020-09-29 DIAGNOSIS — I5022 Chronic systolic (congestive) heart failure: Secondary | ICD-10-CM | POA: Diagnosis not present

## 2020-09-29 DIAGNOSIS — R4182 Altered mental status, unspecified: Secondary | ICD-10-CM

## 2020-09-29 DIAGNOSIS — I110 Hypertensive heart disease with heart failure: Secondary | ICD-10-CM | POA: Diagnosis not present

## 2020-09-29 DIAGNOSIS — G319 Degenerative disease of nervous system, unspecified: Secondary | ICD-10-CM | POA: Diagnosis not present

## 2020-09-29 DIAGNOSIS — E873 Alkalosis: Secondary | ICD-10-CM | POA: Diagnosis not present

## 2020-09-29 DIAGNOSIS — Z951 Presence of aortocoronary bypass graft: Secondary | ICD-10-CM | POA: Diagnosis not present

## 2020-09-29 DIAGNOSIS — R319 Hematuria, unspecified: Secondary | ICD-10-CM | POA: Diagnosis not present

## 2020-09-29 DIAGNOSIS — Z978 Presence of other specified devices: Secondary | ICD-10-CM

## 2020-09-29 DIAGNOSIS — K5900 Constipation, unspecified: Secondary | ICD-10-CM | POA: Diagnosis present

## 2020-09-29 DIAGNOSIS — I509 Heart failure, unspecified: Secondary | ICD-10-CM | POA: Diagnosis not present

## 2020-09-29 DIAGNOSIS — R918 Other nonspecific abnormal finding of lung field: Secondary | ICD-10-CM | POA: Diagnosis not present

## 2020-09-29 DIAGNOSIS — I82402 Acute embolism and thrombosis of unspecified deep veins of left lower extremity: Secondary | ICD-10-CM | POA: Diagnosis not present

## 2020-09-29 DIAGNOSIS — N390 Urinary tract infection, site not specified: Secondary | ICD-10-CM | POA: Diagnosis not present

## 2020-09-29 DIAGNOSIS — I8290 Acute embolism and thrombosis of unspecified vein: Secondary | ICD-10-CM | POA: Diagnosis not present

## 2020-09-29 LAB — CBC
HCT: 35.9 % — ABNORMAL LOW (ref 39.0–52.0)
Hemoglobin: 11.7 g/dL — ABNORMAL LOW (ref 13.0–17.0)
MCH: 32.2 pg (ref 26.0–34.0)
MCHC: 32.6 g/dL (ref 30.0–36.0)
MCV: 98.9 fL (ref 80.0–100.0)
Platelets: 175 10*3/uL (ref 150–400)
RBC: 3.63 MIL/uL — ABNORMAL LOW (ref 4.22–5.81)
RDW: 15.9 % — ABNORMAL HIGH (ref 11.5–15.5)
WBC: 6.3 10*3/uL (ref 4.0–10.5)
nRBC: 0 % (ref 0.0–0.2)

## 2020-09-29 LAB — PROTIME-INR
INR: 3.2 — ABNORMAL HIGH (ref 0.8–1.2)
INR: 2.9 — ABNORMAL HIGH (ref 0.8–1.2)
Prothrombin Time: 32.3 seconds — ABNORMAL HIGH (ref 11.4–15.2)
Prothrombin Time: 30.1 seconds — ABNORMAL HIGH (ref 11.4–15.2)

## 2020-09-29 MED ORDER — POLYETHYLENE GLYCOL 3350 17 G PO PACK: 17.0000 g | PACK | Freq: Every day | ORAL | Status: DC

## 2020-09-29 MED ORDER — WARFARIN SODIUM 5 MG PO TABS: ORAL_TABLET | ORAL | 1 refills | Status: DC

## 2020-09-29 MED ORDER — WARFARIN SODIUM 1 MG PO TABS: 1.0000 mg | ORAL_TABLET | Freq: Every day | ORAL | 0 refills | Status: DC

## 2020-09-29 NOTE — TOC Transition Note (Addendum)
Transition of Care [facility]) - CM/SW Discharge Note   Patient Details  Name: Austin Davis MRN: [id number] Date of Birth: August 21, 1940  [facility] CM/SW Contact:  Ross Ludwig, LCSW Phone Number: 09/29/2020, 1:03 PM   Clinical Narrative:    Patient has orders to transfer to [place].  CSW spoke to [facility], they can accept patient today.  Patient to be d/c'ed today to [facility].  Patient and family agreeable to plans will transport via ems RN to call report to [phone].     Final next level of care: Long Term Acute Care (LTAC) Barriers to Discharge: Barriers Resolved   Patient Goals and CMS Choice Patient states their goals for this hospitalization and ongoing recovery are:: To go to [facility] for care. CMS Medicare.gov Compare Post Acute Care list provided to:: Patient Represenative (must comment) Choice offered to / list presented to : Adult Children  Discharge Placement                    Patient and family notified of of transfer: 09/29/20  Discharge Plan and Services   Discharge Planning Services: CM Consult Post Acute Care Choice: [place]                               Social Determinants of Health (SDOH) Interventions     Readmission Risk Interventions No flowsheet data found.

## 2020-09-29 NOTE — Discharge Summary (Signed)
Physician Discharge Summary  Austin Davis HLK:[id number] DOB: 1940-10-01 DOA: 09/16/2020  PCP: [name], MD  Admit date: 09/16/2020 Discharge date: 09/29/2020  Admitted From: Home Disposition: LTAC  Recommendations for Outpatient Follow-up:  Follow up with PCP in 1-2 weeks Please obtain BMP/CBC in one week  Discharge Condition: Guarded CODE STATUS: Partial, BiPAP/ACLS medications only; no intubation, no CPR Diet recommendation:    Brief/Interim Summary: 80 year old male with a history of paroxysmal atrial fibrillation on anticoagulation, essential tremor, obstructive sleep apnea on CPAP, who has had a progressive decline in his health, weight loss over the last year due to decreased p.o. intake, presented with worsening shortness of breath.  He was noted to be tachycardic, hypotensive and hypoxic on arrival.  Initial ABG showed respiratory acidosis with a PCO2 of 90.  He was started on BiPAP.  INR was noted to be 10.9 and he was given vitamin K in the emergency room. Chest x-ray did not show any evidence of infiltrates.  Patient remains stable for discharge - no acute issues/events overnight - awaiting safe dispo to LTAC.  Assessment & Plan:   Goals of care - Multiple rapid responses over the past 48 hours in the setting of acute mental status changes, hypoxia, dyspnea as well as hypertension - Patient continues to have abysmally poor p.o. intake both with fluids and nutrition. - Palliative care following, continues as partial code with DO NOT INTUBATE or perform CPR orders most notably. -Transition to LTAC for ongoing respiratory therapy and physical therapy, continue to push nutrition, patient remains appropriate for further discussion with palliative care and hospice should he continue to decline but in the interim appears to be stabilizing today and will hopefully continue to improve with increased p.o. intake   Acute on chronic respiratory failure with hypoxia and  hypercapnia - Patient has a reported history of obstructive sleep apnea and was recently set up with CPAP in 07/2020.  He reportedly had difficulty tolerating this and was using it infrequently - No clear etiology for acute respiratory failure at this point, imaging is unremarkable for overt findings, continue BiPAP/supportive care as indicated as well as at night.  Pulmonology previously evaluated recommending home Trelegy - Likely exacerbated by patient's malnutrition, poor p.o. intake and general debility   Acute on Chronic combined CHF resolved - Ejection fraction 45 to 50% with grade 1 diastolic dysfunction - BNP elevated at 876 at intake, diuresed well, likely not a major player in respiratory symptoms as above at this point -Continue to follow volume status closely, currently off diuretics in the setting of poor p.o. intake and likely dehydration   Failure to thrive/generalized weakness/severe malnutrition - Unclear etiology, continue steroid taper over the next week. - Continue to encourage increased p.o. intake given ongoing malnutrition is likely major player in patient's ongoing weakness and debility - Questionably underlying neurodegenerative disease but patient currently refusing MRI due to claustrophobia and orthopnea, followed outpatient previously with outpatient neurology with no MRI at that time, unclear if MRI which really alter patient's medical course at this point.  He remains without focal deficits.   Dysphagia - Speech therapy following -continue to advance diet as tolerated - Likely secondary to above malnourished state and generalized weakness/debility    Paroxysmal atrial fibrillation SVT - Continue Cardizem as tolerated - Continue Coumadin - Follow-up with cardiology as an outpatient   Essential tremor, chronic - He is followed by [facility] - Intolerant of beta-blockers in the past - Uses benzos as needed  Severe protein calorie malnutrition -BMI  16.2 -Nutrition following -Likely the crux of his debility at this point  Discharge Instructions  Discharge Instructions     Diet - low sodium heart healthy   Complete by: As directed    Discharge wound care:   Complete by: As directed    Dressing changes daily as indicated   Increase activity slowly   Complete by: As directed       Allergies as of 09/29/2020       Reactions   Prednisone Other (See Comments)   Makes skin crawl, rapid HR   Cortisone Palpitations        Medication List     STOP taking these medications    diphenhydramine-acetaminophen 25-500 MG Tabs tablet Commonly known as: TYLENOL PM   Garlic 277 MG Tabs   primidone 50 MG tablet Commonly known as: MYSOLINE       TAKE these medications    acetaminophen 500 MG tablet Commonly known as: TYLENOL Take 500 mg by mouth every 6 (six) hours as needed for mild pain or headache.   ALPRAZolam 0.25 MG tablet Commonly known as: XANAX Take 0.125 mg by mouth at bedtime as needed for anxiety.   D3 50 MCG (2000 UT) Tabs Generic drug: Cholecalciferol Take 2,000 Units by mouth daily.   diltiazem 30 MG tablet Commonly known as: CARDIZEM Take 1 tablet (30 mg total) by mouth every 12 (twelve) hours.   feeding supplement Liqd Take 237 mLs by mouth 2 (two) times daily between meals.   Ensure Max Protein Liqd Take 330 mLs (11 oz total) by mouth daily.   FISH OIL PO Take 1 capsule by mouth daily.   folic acid 1 MG tablet Commonly known as: FOLVITE Take 1 tablet (1 mg total) by mouth daily.   Magnesium 250 MG Tabs Take 250 mg by mouth daily.   midodrine 5 MG tablet Commonly known as: PROAMATINE Take 1 tablet (5 mg total) by mouth 3 (three) times daily with meals.   mirtazapine 7.5 MG tablet Commonly known as: REMERON Take 1 tablet (7.5 mg total) by mouth at bedtime.   multivitamin with minerals Tabs tablet Take 1 tablet by mouth daily.   pantoprazole 40 MG tablet Commonly known as:  PROTONIX Take 1 tablet (40 mg total) by mouth 2 (two) times daily.   predniSONE 10 MG tablet Commonly known as: DELTASONE Take 4 tablets (40 mg total) by mouth daily for 3 days, THEN 3 tablets (30 mg total) daily for 3 days, THEN 2 tablets (20 mg total) daily for 3 days, THEN 1 tablet (10 mg total) daily for 3 days. Start taking on: September 28, 2020   senna-docusate 8.6-50 MG tablet Commonly known as: Senokot-S Take 1 tablet by mouth 2 (two) times daily.   VITAMIN B COMPLEX PO Take 1 tablet by mouth daily.   VITAMIN C IMMUNE HEALTH PO Take 500 mg by mouth.   warfarin 1 MG tablet Commonly known as: COUMADIN Take 1 mg by mouth daily. What changed: Another medication with the same name was changed. Make sure you understand how and when to take each.   warfarin 5 MG tablet Commonly known as: COUMADIN TAKE 1/2 TO 1 TABLET DAILY AS DIRECTED BY COUMADIN CLINIC. What changed: See the new instructions.               Durable Medical Equipment  (From admission, onward)           Start  Ordered   09/19/20 1127  For home use only DME Ventilator  Once       Comments: Trilogy vent  Question:  Length of Need  Answer:  12 Months   09/19/20 1127              Discharge Care Instructions  (From admission, onward)           Start     Ordered   09/28/20 0000  Discharge wound care:       Comments: Dressing changes daily as indicated   09/28/20 1256            Follow-up Information     Hunsucker, Bonna Gains, MD Follow up.   Specialty: Pulmonary Disease Why: Call our office when you leave the hospital to schedule appointment Contact information: [address] [place] [address] [phone]                Allergies  Allergen Reactions   Prednisone Other (See Comments)    Makes skin crawl, rapid HR   Cortisone Palpitations    Consultations: Palliative care, pulm/critical care   Procedures/Studies: DG Chest 2  View  Result Date: 09/16/2020 CLINICAL DATA:  sob EXAM: CHEST - 2 VIEW COMPARISON:  June 30, 2020 FINDINGS: The cardiomediastinal silhouette is unchanged in contour.Tortuous thoracic aorta. Status post median sternotomy. No pleural effusion. No pneumothorax. No acute pleuroparenchymal abnormality. Visualized abdomen is unremarkable. Compression fracture deformity of the mid to inferior thoracic spine, unchanged. IMPRESSION: No acute cardiopulmonary abnormality. Electronically Signed   By: Valentino Saxon M.D.   On: 09/16/2020 15:35   CT HEAD WO CONTRAST (5MM)  Result Date: 09/21/2020 CLINICAL DATA:  Vertigo, central. EXAM: CT HEAD WITHOUT CONTRAST TECHNIQUE: Contiguous axial images were obtained from the base of the skull through the vertex without intravenous contrast. COMPARISON:  None. FINDINGS: Brain: Mild generalized cerebral and cerebellar atrophy. Mild patchy and ill-defined hypoattenuation within the cerebral white matter, nonspecific but compatible with chronic small vessel ischemic disease. There is no acute intracranial hemorrhage. No demarcated cortical infarct. No extra-axial fluid collection. No evidence of an intracranial mass. No midline shift. Vascular: No hyperdense vessel.  Atherosclerotic calcifications Skull: Normal. Negative for fracture or focal lesion. Sinuses/Orbits: Visualized orbits show no acute finding. No significant paranasal sinus disease at the imaged levels. IMPRESSION: No evidence of acute intracranial abnormality. Mild chronic small-vessel ischemic changes within the cerebral white matter. Mild generalized parenchymal atrophy. Electronically Signed   By: Kellie Simmering D.O.   On: 09/21/2020 14:09   CT CHEST WO CONTRAST  Result Date: 09/18/2020 CLINICAL DATA:  Respiratory failure and uncontrollable tremors. EXAM: CT CHEST WITHOUT CONTRAST TECHNIQUE: Multidetector CT imaging of the chest was performed following the standard protocol without IV contrast. COMPARISON:  None.  FINDINGS: Cardiovascular: There is mild calcification of the aortic arch. Normal heart size with moderate to marked severity coronary artery calcification. No pericardial effusion. Mediastinum/Nodes: No enlarged mediastinal or axillary lymph nodes. Thyroid gland, trachea, and esophagus demonstrate no significant findings. Lungs/Pleura: Mild scarring and/or atelectasis is seen within the bilateral apices and posterior aspects of the bilateral lung bases. Small bilateral pleural effusions are noted. No pneumothorax is identified. Upper Abdomen: No acute abnormality. Musculoskeletal: Multiple sternal wires are present. A chronic compression fracture deformity is seen at the level of T6 and T10. IMPRESSION: 1. Small bilateral pleural effusions. 2. Mild biapical and mild bibasilar scarring and/or atelectasis. 3. Moderate to marked severity coronary artery calcification. 4. Chronic compression  fracture deformity at the level of T6 and T10. 5. Aortic atherosclerosis. Aortic Atherosclerosis (ICD10-I70.0). Electronically Signed   By: Virgina Norfolk M.D.   On: 09/18/2020 20:05   CT ABDOMEN PELVIS W CONTRAST  Result Date: 09/22/2020 CLINICAL DATA:  Unintentional weight loss, unable to eat or drink EXAM: CT ABDOMEN AND PELVIS WITH CONTRAST TECHNIQUE: Multidetector CT imaging of the abdomen and pelvis was performed using the standard protocol following bolus administration of intravenous contrast. CONTRAST:  69mL OMNIPAQUE IOHEXOL 350 MG/ML SOLN COMPARISON:  05/10/2020 FINDINGS: Lower chest: Median sternotomy has been performed. Cardiac size within normal limits. No pericardial effusion. Small bilateral pleural effusions have developed with associated bibasilar compressive atelectasis. Hepatobiliary: Tiny scattered cysts are seen within the liver, stable since prior examination. The liver is otherwise unremarkable. No intra or extrahepatic biliary ductal dilation. Gallbladder unremarkable. Pancreas: Unremarkable Spleen:  Unremarkable Adrenals/Urinary Tract: The adrenal glands are unremarkable. The kidneys are normal. Foley catheter balloon is seen within a decompressed bladder lumen. Previously noted calculi within the bladder lumen has been largely evacuated though a single 1-2 mm calculus remains within the dependent right bladder lumen. Stomach/Bowel: Moderate stool noted throughout the colon. Large volume stool noted within the rectal vault. No definite evidence of obstruction. The stomach, small bowel, and large bowel are otherwise unremarkable. Appendix is normal. No free intraperitoneal gas or fluid. Vascular/Lymphatic: Mild aortoiliac atherosclerotic calcification. No aortic aneurysm. No pathologic adenopathy within the abdomen and pelvis. Reproductive: Mild prostatic enlargement is stable. Other: No abdominal wall hernia. Interval development of mild subcutaneous edema within the abdominal wall, particularly within the flanks bilaterally. Musculoskeletal: No acute bone abnormality. Osseous structures are diffusely osteopenic. Chronic appearing T10 compression fracture noted. No lytic or blastic bone lesions noted. IMPRESSION: Interval development of small bilateral pleural effusions and diffuse body wall subcutaneous edema in keeping with mild anasarca. Large volume stool within the rectal vault. Clinical correlation for fecal impaction may be helpful. Superimposed moderate stool throughout the colon without evidence of obstruction. Minimal residual bladder calculi. Interval Foley decompression of the bladder. Aortic Atherosclerosis (ICD10-I70.0). Electronically Signed   By: Fidela Salisbury M.D.   On: 09/22/2020 20:41   ECHOCARDIOGRAM COMPLETE  Result Date: 09/17/2020    ECHOCARDIOGRAM REPORT   Patient Name:   Austin Davis Date of Exam: 09/17/2020 Medical Rec #:  [id number]        Height:       67.0 in Accession #:    [id number]       Weight:       100.0 lb Date of Birth:  1940-02-04       BSA:          1.506 m  Patient Age:    76 years         BP:           149/130 mmHg Patient Gender: M                HR:           94 bpm. Exam Location:  Inpatient Procedure: 2D Echo, 3D Echo, Cardiac Doppler and Color Doppler Indications:    R06.02 SOB  History:        Patient has prior history of Echocardiogram examinations, most                 recent 10/14/2019. Abnormal ECG, Mitral Valve Disease,                 Arrythmias:Tachycardia and Atrial Fibrillation,  Signs/Symptoms:Hypotension, Shortness of Breath and Dyspnea;                 Risk Factors:Sleep Apnea and Dyslipidemia. Maze procedure.                  Mitral Valve: prosthetic annuloplasty ring valve is present in                 the mitral position. Procedure Date: 2005.  Sonographer:    Roseanna Rainbow RDCS Referring Phys: [id number] [place] T TU  Sonographer Comments: Technically difficult study due to poor echo windows and no subcostal window. Patient has extremely thin body habitus and very high window. IMPRESSIONS  1. Left ventricular ejection fraction, by estimation, is 45 to 50%. The left ventricle has mildly decreased function. The left ventricle demonstrates regional wall motion abnormalities with mid to apical inferoseptal hypokinesis, inferior hypokinesis, and inferolateral hypokinesis. There is mild left ventricular hypertrophy. Left ventricular diastolic parameters are consistent with Grade I diastolic dysfunction (impaired relaxation).  2. Right ventricular systolic function is mildly reduced. The right ventricular size is normal. Tricuspid regurgitation signal is inadequate for assessing PA pressure.  3. Left atrial size was mildly dilated.  4. Status post mitral valve repair. Mean gradient 3 mmHg, no signicant stenosis. No significant regurgitation noted.  5. The aortic valve is tricuspid. Aortic valve regurgitation is trivial. Mild aortic valve sclerosis is present, with no evidence of aortic valve stenosis.  6. Aortic dilatation noted. There is mild  dilatation of the ascending aorta, measuring 41 mm.  7. Technically difficult study with poor acoustic windows. FINDINGS  Left Ventricle: Left ventricular ejection fraction, by estimation, is 45 to 50%. The left ventricle has mildly decreased function. The left ventricle demonstrates regional wall motion abnormalities. The left ventricular internal cavity size was normal in size. There is mild left ventricular hypertrophy. Left ventricular diastolic parameters are consistent with Grade I diastolic dysfunction (impaired relaxation). Right Ventricle: The right ventricular size is normal. No increase in right ventricular wall thickness. Right ventricular systolic function is mildly reduced. Tricuspid regurgitation signal is inadequate for assessing PA pressure. Left Atrium: Left atrial size was mildly dilated. Right Atrium: Right atrial size was normal in size. Pericardium: There is no evidence of pericardial effusion. Mitral Valve: Status post mitral valve repair. Mean gradient 3 mmHg, no signicant stenosis. No significant regurgitation noted. The mitral valve has been repaired/replaced. No evidence of mitral valve regurgitation. There is a prosthetic annuloplasty ring present in the mitral position. Procedure Date: 2005. MV peak gradient, 7.3 mmHg. The mean mitral valve gradient is 3.0 mmHg. Tricuspid Valve: The tricuspid valve is normal in structure. Tricuspid valve regurgitation is trivial. Aortic Valve: The aortic valve is tricuspid. Aortic valve regurgitation is trivial. Mild aortic valve sclerosis is present, with no evidence of aortic valve stenosis. Pulmonic Valve: The pulmonic valve was normal in structure. Pulmonic valve regurgitation is not visualized. Aorta: Aortic dilatation noted. There is mild dilatation of the ascending aorta, measuring 41 mm. Venous: The inferior vena cava was not well visualized. IAS/Shunts: No atrial level shunt detected by color flow Doppler.  LEFT VENTRICLE PLAX 2D LVIDd:          3.40 cm      Diastology LVIDs:         2.60 cm      LV e' medial:    6.85 cm/s LV PW:         1.25 cm      LV E/e'  medial:  19.6 LV IVS:        1.40 cm      LV e' lateral:   8.16 cm/s LVOT diam:     2.00 cm      LV E/e' lateral: 16.4 LV SV:         45 LV SV Index:   30 LVOT Area:     3.14 cm  LV Volumes (MOD) LV vol d, MOD A2C: 115.0 ml LV vol d, MOD A4C: 66.2 ml LV vol s, MOD A2C: 58.0 ml LV vol s, MOD A4C: 37.5 ml LV SV MOD A2C:     57.0 ml LV SV MOD A4C:     66.2 ml LV SV MOD BP:      42.0 ml RIGHT VENTRICLE RV S prime:     9.14 cm/s TAPSE (M-mode): 1.0 cm LEFT ATRIUM             Index       RIGHT ATRIUM          Index LA diam:        3.50 cm 2.32 cm/m  RA Area:     9.97 cm LA Vol (A2C):   49.5 ml 32.86 ml/m RA Volume:   19.40 ml 12.88 ml/m LA Vol (A4C):   20.3 ml 13.48 ml/m LA Biplane Vol: 30.9 ml 20.51 ml/m  AORTIC VALVE LVOT Vmax:   105.00 cm/s LVOT Vmean:  65.500 cm/s LVOT VTI:    0.142 m  AORTA Ao Root diam: 3.95 cm Ao Asc diam:  4.10 cm MITRAL VALVE MV Area (PHT): 5.02 cm     SHUNTS MV Area VTI:   1.08 cm     Systemic VTI:  0.14 m MV Peak grad:  7.3 mmHg     Systemic Diam: 2.00 cm MV Mean grad:  3.0 mmHg MV Vmax:       1.35 m/s MV Vmean:      75.7 cm/s MV Decel Time: 151 msec MV E velocity: 134.00 cm/s MV A velocity: 130.00 cm/s MV E/A ratio:  1.03 Dalton McleanMD Electronically signed by Franki Monte Signature Date/Time: 09/17/2020/3:55:31 PM    Final      Subjective: No acute issues or events overnight, somewhat more awake alert oriented this morning appears to be back to baseline mental status at that he did tolerate breakfast at bedside without any overt difficulty.  Denies nausea vomiting diarrhea constipation headache fevers chills chest pain -continues to report orthopnea but markedly improving   Discharge Exam: Vitals:   09/29/20 0501 09/29/20 0505  BP:  96/67  Pulse:  65  Resp:  (!) 21  Temp: 98.5 F (36.9 C)   SpO2:  100%   Vitals:   09/28/20 2328 09/29/20 0423 09/29/20  0501 09/29/20 0505  BP:    96/67  Pulse: 84 66  65  Resp: 15 16  (!) 21  Temp:   98.5 F (36.9 C)   TempSrc:   Oral   SpO2: 98% 100%  100%  Weight:      Height:        General: Pt is alert, awake, not in acute distress Cardiovascular: S1/S2 +, no rubs, no gallops Respiratory: CTA bilaterally, no wheezing, no rhonchi Abdominal: Soft, NT, ND, bowel sounds + Extremities: no edema, no cyanosis    The results of significant diagnostics from this hospitalization (including imaging, microbiology, ancillary and laboratory) are listed below for reference.     Microbiology: No results found for this or any  previous visit (from the past 240 hour(s)).   Labs: BNP (last 3 results) Recent Labs    09/19/20 1606  BNP 876.9*    Basic Metabolic Panel: Recent Labs  Lab 09/23/20 0259 09/24/20 0418 09/25/20 0241 09/26/20 0438  NA 138 139 137 138  K 4.1 3.7 3.7 3.9  CL 91* 93* 94* 92*  CO2 37* 39* 35* 40*  GLUCOSE 91 116* 110* 104*  BUN 23 19 18 18   CREATININE 0.50* 0.46* 0.46* 0.54*  CALCIUM 8.5* 8.7* 8.5* 8.7*  PHOS 2.0* 2.2* 3.7  --     Liver Function Tests: Recent Labs  Lab 09/23/20 0259 09/24/20 0418 09/25/20 0241  ALBUMIN 3.1* 3.2* 3.2*    No results for input(s): LIPASE, AMYLASE in the last 168 hours. No results for input(s): AMMONIA in the last 168 hours. CBC: Recent Labs  Lab 09/25/20 0241 09/26/20 0438 09/27/20 0428 09/28/20 0426 09/29/20 0500  WBC 7.6 8.3 6.4 5.6 6.3  HGB 13.3 13.5 12.1* 12.0* 11.7*  HCT 41.0 41.8 37.1* 35.4* 35.9*  MCV 98.8 99.8 98.1 94.9 98.9  PLT 167 191 151 166 175    Cardiac Enzymes: No results for input(s): CKTOTAL, CKMB, CKMBINDEX, TROPONINI in the last 168 hours.  BNP: Invalid input(s): POCBNP CBG: Recent Labs  Lab 09/28/20 2107  GLUCAP 140*   D-Dimer No results for input(s): DDIMER in the last 72 hours. Hgb A1c No results for input(s): HGBA1C in the last 72 hours. Lipid Profile No results for input(s): CHOL,  HDL, LDLCALC, TRIG, CHOLHDL, LDLDIRECT in the last 72 hours. Thyroid function studies No results for input(s): TSH, T4TOTAL, T3FREE, THYROIDAB in the last 72 hours.  Invalid input(s): FREET3 Anemia work up No results for input(s): VITAMINB12, FOLATE, FERRITIN, TIBC, IRON, RETICCTPCT in the last 72 hours. Urinalysis    Component Value Date/Time   COLORURINE YELLOW 09/17/2020 0340   APPEARANCEUR HAZY (A) 09/17/2020 0340   LABSPEC 1.019 09/17/2020 0340   PHURINE 5.0 09/17/2020 0340   GLUCOSEU NEGATIVE 09/17/2020 0340   HGBUR LARGE (A) 09/17/2020 0340   BILIRUBINUR NEGATIVE 09/17/2020 0340   KETONESUR 20 (A) 09/17/2020 0340   PROTEINUR NEGATIVE 09/17/2020 0340   UROBILINOGEN 0.2 09/25/2006 1500   NITRITE NEGATIVE 09/17/2020 0340   LEUKOCYTESUR NEGATIVE 09/17/2020 0340   Sepsis Labs Invalid input(s): PROCALCITONIN,  WBC,  LACTICIDVEN Microbiology No results found for this or any previous visit (from the past 240 hour(s)).   Time coordinating discharge: Over 30 minutes  SIGNED:   [name], DO [facility] 09/29/2020, 7:35 AM Pager   If 7PM-7AM, please contact night-coverage [email or web address]

## 2020-09-30 ENCOUNTER — Other Ambulatory Visit (HOSPITAL_COMMUNITY): Payer: Medicare Other

## 2020-09-30 LAB — PROTIME-INR
INR: 2.3 — ABNORMAL HIGH (ref 0.8–1.2)
Prothrombin Time: 25.1 seconds — ABNORMAL HIGH (ref 11.4–15.2)

## 2020-09-30 LAB — COMPREHENSIVE METABOLIC PANEL
ALT: 45 U/L — ABNORMAL HIGH (ref 0–44)
AST: 22 U/L (ref 15–41)
Albumin: 3 g/dL — ABNORMAL LOW (ref 3.5–5.0)
Alkaline Phosphatase: 51 U/L (ref 38–126)
Anion gap: 6 (ref 5–15)
BUN: 23 mg/dL (ref 8–23)
CO2: 42 mmol/L — ABNORMAL HIGH (ref 22–32)
Calcium: 8.7 mg/dL — ABNORMAL LOW (ref 8.9–10.3)
Chloride: 91 mmol/L — ABNORMAL LOW (ref 98–111)
Creatinine, Ser: 0.62 mg/dL (ref 0.61–1.24)
GFR, Estimated: >60 mL/min (ref >60)
Glucose, Bld: 115 mg/dL — ABNORMAL HIGH (ref 70–99)
Potassium: 3.3 mmol/L — ABNORMAL LOW (ref 3.5–5.1)
Sodium: 139 mmol/L (ref 135–145)
Total Bilirubin: 0.8 mg/dL (ref 0.3–1.2)
Total Protein: 5.2 g/dL — ABNORMAL LOW (ref 6.5–8.1)

## 2020-09-30 LAB — CBC
HCT: 40 % (ref 39.0–52.0)
Hemoglobin: 13 g/dL (ref 13.0–17.0)
MCH: 32 pg (ref 26.0–34.0)
MCHC: 32.5 g/dL (ref 30.0–36.0)
MCV: 98.5 fL (ref 80.0–100.0)
Platelets: 209 10*3/uL (ref 150–400)
RBC: 4.06 MIL/uL — ABNORMAL LOW (ref 4.22–5.81)
RDW: 15.7 % — ABNORMAL HIGH (ref 11.5–15.5)
WBC: 9.6 10*3/uL (ref 4.0–10.5)
nRBC: 0 % (ref 0.0–0.2)

## 2020-09-30 IMAGING — DX DG ABD PORTABLE 1V
1 series · 1 of 1 positions shown · non-contrast
Comparison: None.

CLINICAL DATA: Abdominal distension evaluate for ileus.

EXAM:
PORTABLE ABDOMEN - 1 VIEW

[abdomen kub]
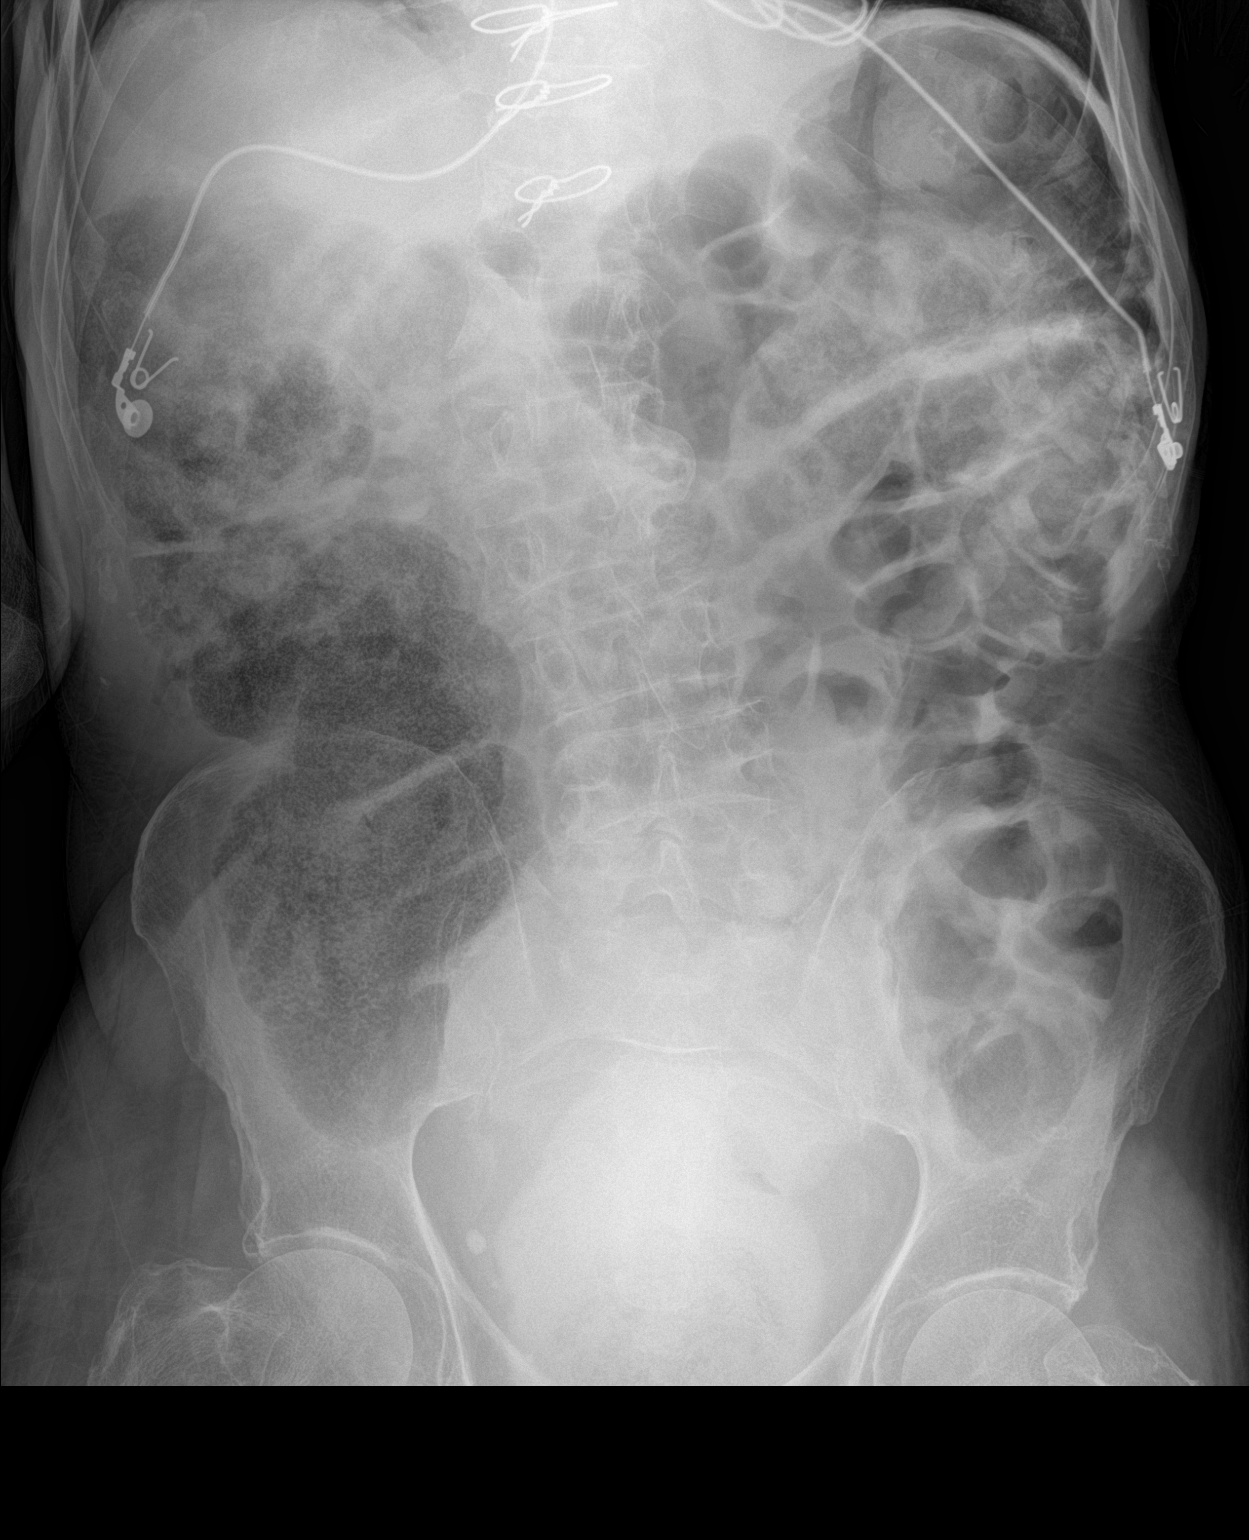

[1 of 1 positions shown; findings below may reference images not displayed]

FINDINGS: Extensive bowel content is identified throughout colon specially in
the right colon and in the rectum. There is no small bowel
obstruction. Scoliosis of the spine is noted. Degenerative joint
changes of the spine are noted.
IMPRESSION: Extensive bowel content is identified throughout colon specially in
the right colon and in the rectum consistent with constipation.

## 2020-10-01 ENCOUNTER — Other Ambulatory Visit (HOSPITAL_COMMUNITY): Payer: Medicare Other

## 2020-10-01 LAB — POTASSIUM
Potassium: 5.7 mmol/L — ABNORMAL HIGH (ref 3.5–5.1)
Potassium: 4 mmol/L (ref 3.5–5.1)

## 2020-10-01 LAB — PROTIME-INR
INR: 2.2 — ABNORMAL HIGH (ref 0.8–1.2)
Prothrombin Time: 24.4 seconds — ABNORMAL HIGH (ref 11.4–15.2)

## 2020-10-01 IMAGING — DX DG CHEST 1V PORT
1 series · 1 of 1 positions shown · non-contrast
Comparison: [DATE]

CLINICAL DATA: Respiratory failure.

EXAM:
PORTABLE CHEST 1 VIEW

[chest]
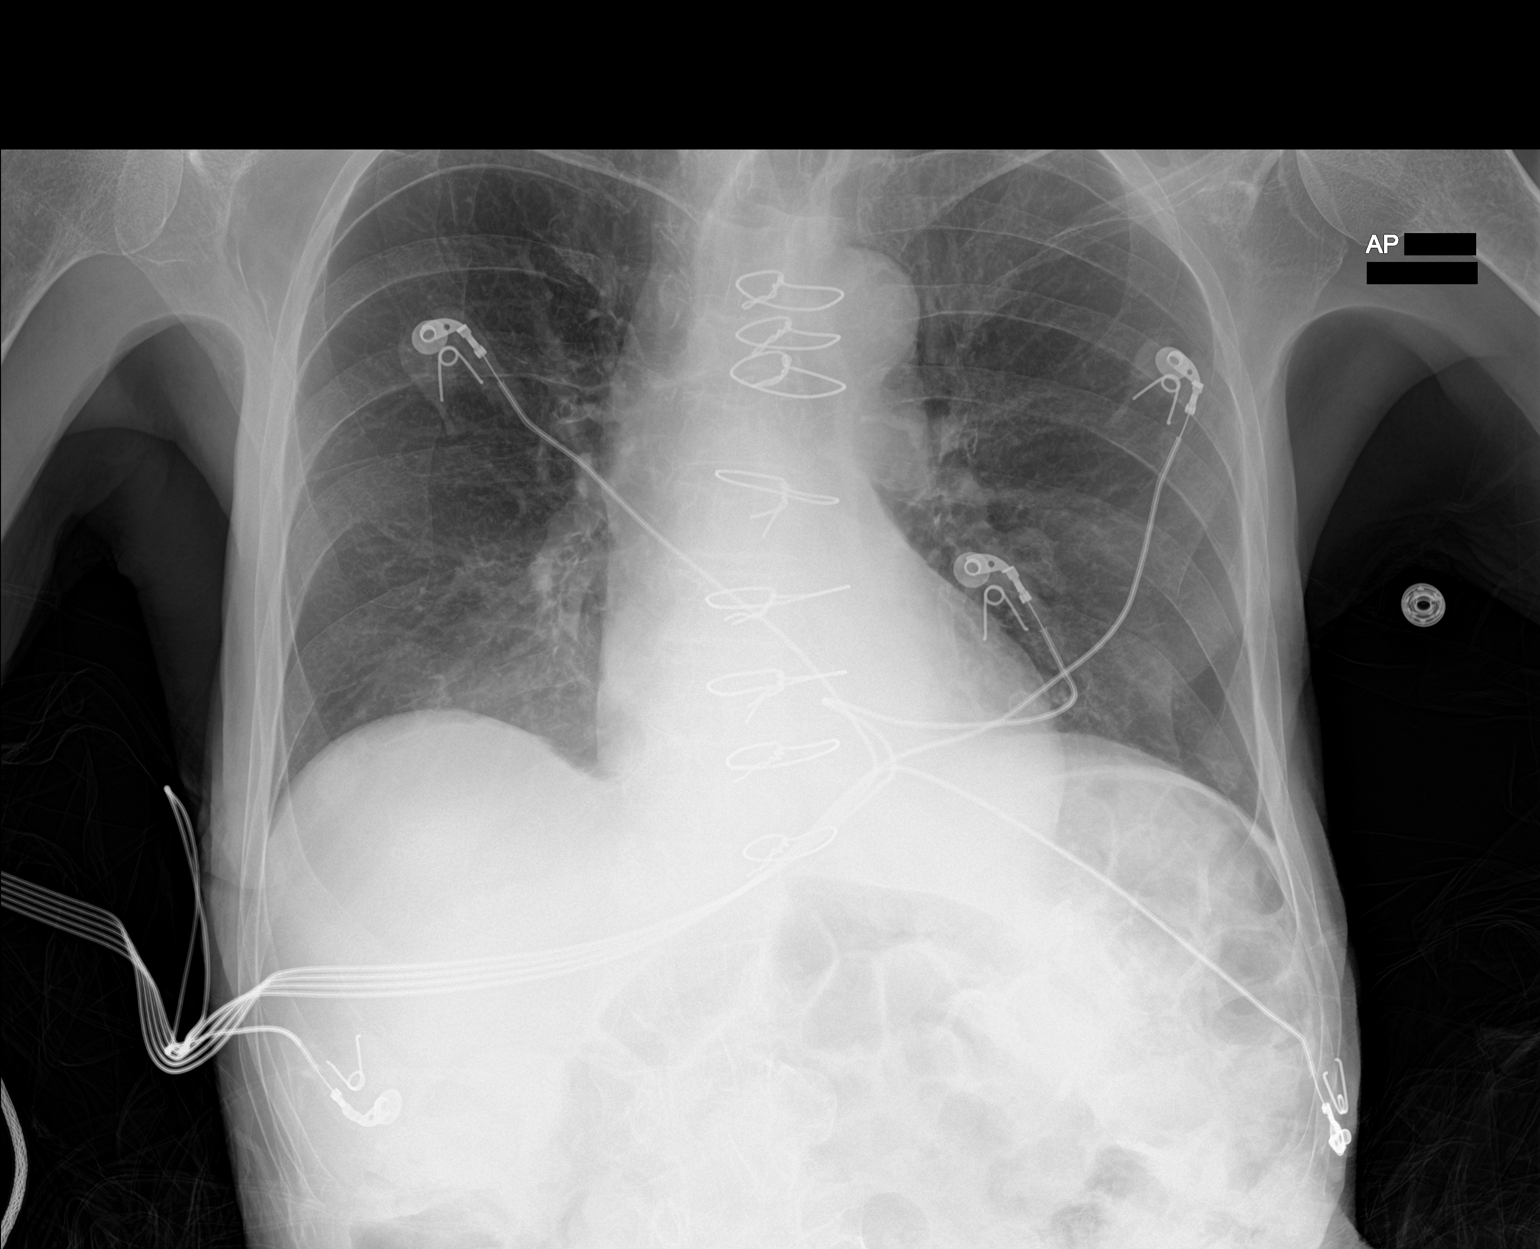

[1 of 1 positions shown; findings below may reference images not displayed]

FINDINGS: [1B] hours. The lungs are clear without focal pneumonia, edema,
pneumothorax or pleural effusion. The cardiopericardial silhouette
is within normal limits for size. Skin fold overlies the left lower
lung. The visualized bony structures of the thorax show no acute
abnormality. Telemetry leads overlie the chest.
IMPRESSION: No active disease.

## 2020-10-02 LAB — CBC
HCT: 42.7 % (ref 39.0–52.0)
Hemoglobin: 14.3 g/dL (ref 13.0–17.0)
MCH: 32.8 pg (ref 26.0–34.0)
MCHC: 33.5 g/dL (ref 30.0–36.0)
MCV: 97.9 fL (ref 80.0–100.0)
Platelets: 225 10*3/uL (ref 150–400)
RBC: 4.36 MIL/uL (ref 4.22–5.81)
RDW: 15.7 % — ABNORMAL HIGH (ref 11.5–15.5)
WBC: 7 10*3/uL (ref 4.0–10.5)
nRBC: 0 % (ref 0.0–0.2)

## 2020-10-02 LAB — HEMOGLOBIN A1C
Hgb A1c MFr Bld: 6.1 % — ABNORMAL HIGH (ref 4.8–5.6)
Mean Plasma Glucose: 128.37 mg/dL

## 2020-10-02 LAB — PROTIME-INR
INR: 2.8 — ABNORMAL HIGH (ref 0.8–1.2)
Prothrombin Time: 29.2 seconds — ABNORMAL HIGH (ref 11.4–15.2)

## 2020-10-02 LAB — T4, FREE: Free T4: 0.78 ng/dL (ref 0.61–1.12)

## 2020-10-02 LAB — BASIC METABOLIC PANEL
Anion gap: 8 (ref 5–15)
BUN: 17 mg/dL (ref 8–23)
CO2: 40 mmol/L — ABNORMAL HIGH (ref 22–32)
Calcium: 9 mg/dL (ref 8.9–10.3)
Chloride: 89 mmol/L — ABNORMAL LOW (ref 98–111)
Creatinine, Ser: 0.44 mg/dL — ABNORMAL LOW (ref 0.61–1.24)
GFR, Estimated: >60 mL/min (ref >60)
Glucose, Bld: 107 mg/dL — ABNORMAL HIGH (ref 70–99)
Potassium: 4 mmol/L (ref 3.5–5.1)
Sodium: 137 mmol/L (ref 135–145)

## 2020-10-02 LAB — TSH: TSH: 6.239 u[IU]/mL — ABNORMAL HIGH (ref 0.350–4.500)

## 2020-10-02 LAB — MAGNESIUM: Magnesium: 2.3 mg/dL (ref 1.7–2.4)

## 2020-10-02 LAB — PHOSPHORUS: Phosphorus: 2.9 mg/dL (ref 2.5–4.6)

## 2020-10-03 ENCOUNTER — Other Ambulatory Visit (HOSPITAL_COMMUNITY): Payer: Medicare Other

## 2020-10-03 LAB — BLOOD GAS, ARTERIAL
Acid-Base Excess: 9.1 mmol/L — ABNORMAL HIGH (ref 0.0–2.0)
Acid-Base Excess: 14 mmol/L — ABNORMAL HIGH (ref 0.0–2.0)
Bicarbonate: 35.1 mmol/L — ABNORMAL HIGH (ref 20.0–28.0)
Bicarbonate: 37.8 mmol/L — ABNORMAL HIGH (ref 20.0–28.0)
FIO2: 60
FIO2: 75
O2 Saturation: 87.1 %
O2 Saturation: 91.6 %
Patient temperature: 36.2
Patient temperature: 37
pCO2 arterial: 65.3 mmHg (ref 32.0–48.0)
pCO2 arterial: 44.1 mmHg (ref 32.0–48.0)
pH, Arterial: 7.344 — ABNORMAL LOW (ref 7.350–7.450)
pH, Arterial: 7.542 — ABNORMAL HIGH (ref 7.350–7.450)
pO2, Arterial: 51.5 mmHg — ABNORMAL LOW (ref 83.0–108.0)
pO2, Arterial: 52.3 mmHg — ABNORMAL LOW (ref 83.0–108.0)

## 2020-10-03 LAB — HEAVY METALS, BLOOD
Arsenic: <1 ug/L (ref 0–9)
Lead: <1 ug/dL (ref 0–4)
Mercury: 2.1 ug/L (ref 0.0–14.9)

## 2020-10-03 LAB — PROTIME-INR
INR: 2.9 — ABNORMAL HIGH (ref 0.8–1.2)
Prothrombin Time: 30.2 seconds — ABNORMAL HIGH (ref 11.4–15.2)

## 2020-10-03 LAB — BASIC METABOLIC PANEL
Anion gap: 10 (ref 5–15)
BUN: 29 mg/dL — ABNORMAL HIGH (ref 8–23)
CO2: 40 mmol/L — ABNORMAL HIGH (ref 22–32)
Calcium: 9.6 mg/dL (ref 8.9–10.3)
Chloride: 87 mmol/L — ABNORMAL LOW (ref 98–111)
Creatinine, Ser: 0.63 mg/dL (ref 0.61–1.24)
GFR, Estimated: >60 mL/min (ref >60)
Glucose, Bld: 129 mg/dL — ABNORMAL HIGH (ref 70–99)
Potassium: 4.8 mmol/L (ref 3.5–5.1)
Sodium: 137 mmol/L (ref 135–145)

## 2020-10-03 LAB — MAGNESIUM: Magnesium: 2.2 mg/dL (ref 1.7–2.4)

## 2020-10-03 LAB — PHOSPHORUS: Phosphorus: 2.4 mg/dL — ABNORMAL LOW (ref 2.5–4.6)

## 2020-10-03 IMAGING — DX DG CHEST 1V PORT
1 series · 1 of 1 positions shown · non-contrast
Comparison: [DATE]

CLINICAL DATA: Altered mental status.  Shortness of breath.

EXAM:
PORTABLE CHEST 1 VIEW

[chest]
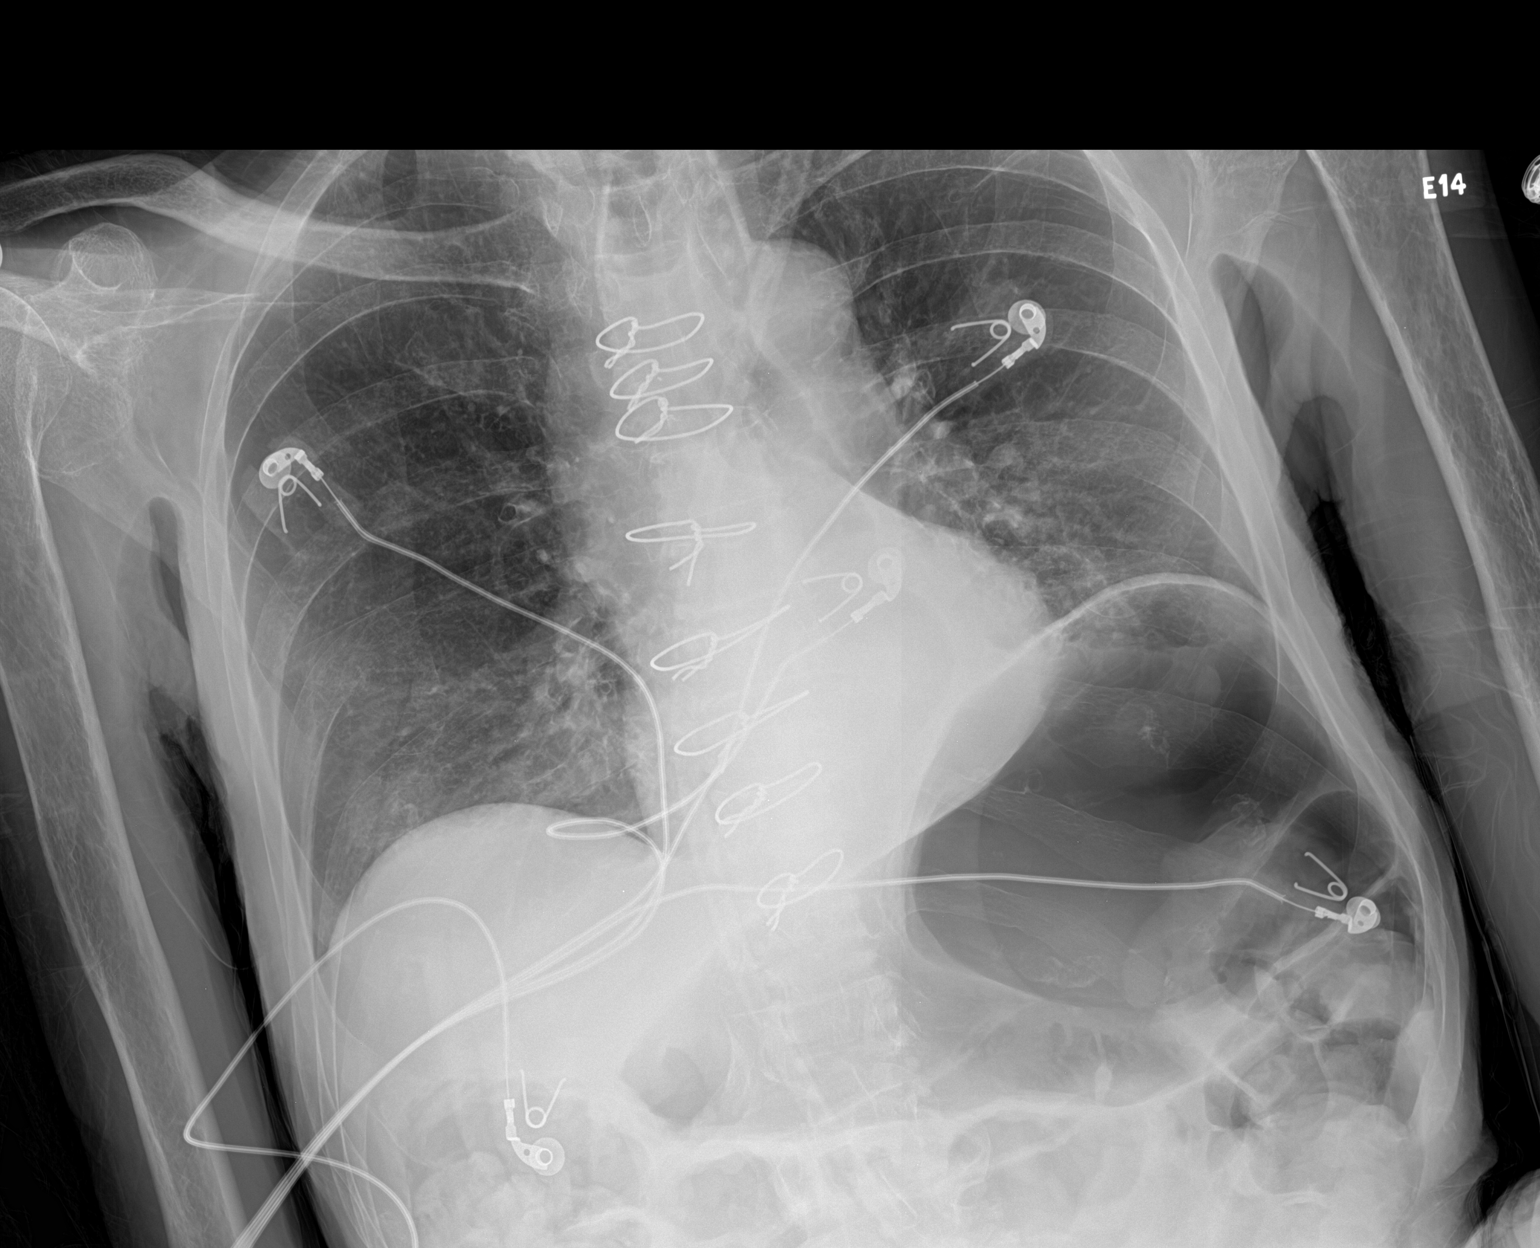

[1 of 1 positions shown; findings below may reference images not displayed]

FINDINGS: [82] hours. The lungs are clear without focal pneumonia, edema,
pneumothorax or pleural effusion. Interstitial markings are
diffusely coarsened with chronic features. The cardiopericardial
silhouette is within normal limits for size. Bones are diffusely
demineralized. Telemetry leads overlie the chest. Stomach is
markedly distended with gas. Bowel distention also noted in the
visualized upper abdomen.
IMPRESSION: 1. No acute cardiopulmonary findings.
2. Marked gaseous distention of the stomach.

## 2020-10-04 ENCOUNTER — Other Ambulatory Visit (HOSPITAL_COMMUNITY): Payer: Medicare Other

## 2020-10-04 LAB — BLOOD GAS, ARTERIAL
Acid-Base Excess: 13.9 mmol/L — ABNORMAL HIGH (ref 0.0–2.0)
Acid-Base Excess: 12.1 mmol/L — ABNORMAL HIGH (ref 0.0–2.0)
Acid-Base Excess: 11.6 mmol/L — ABNORMAL HIGH (ref 0.0–2.0)
Bicarbonate: 39 mmol/L — ABNORMAL HIGH (ref 20.0–28.0)
Bicarbonate: 35.6 mmol/L — ABNORMAL HIGH (ref 20.0–28.0)
Bicarbonate: 35.8 mmol/L — ABNORMAL HIGH (ref 20.0–28.0)
FIO2: 80
FIO2: 100
FIO2: 100
O2 Saturation: 99.5 %
O2 Saturation: 94.9 %
O2 Saturation: 99.5 %
Patient temperature: 36.9
Patient temperature: 36.1
Patient temperature: 36.1
pCO2 arterial: 59.3 mmHg — ABNORMAL HIGH (ref 32.0–48.0)
pCO2 arterial: 38.3 mmHg (ref 32.0–48.0)
pCO2 arterial: 46 mmHg (ref 32.0–48.0)
pH, Arterial: 7.433 (ref 7.350–7.450)
pH, Arterial: 7.572 — ABNORMAL HIGH (ref 7.350–7.450)
pH, Arterial: 7.499 — ABNORMAL HIGH (ref 7.350–7.450)
pO2, Arterial: 245 mmHg — ABNORMAL HIGH (ref 83.0–108.0)
pO2, Arterial: 58.3 mmHg — ABNORMAL LOW (ref 83.0–108.0)
pO2, Arterial: 215 mmHg — ABNORMAL HIGH (ref 83.0–108.0)

## 2020-10-04 LAB — CBC
HCT: 37.7 % — ABNORMAL LOW (ref 39.0–52.0)
Hemoglobin: 12.4 g/dL — ABNORMAL LOW (ref 13.0–17.0)
MCH: 32.6 pg (ref 26.0–34.0)
MCHC: 32.9 g/dL (ref 30.0–36.0)
MCV: 99.2 fL (ref 80.0–100.0)
Platelets: 225 10*3/uL (ref 150–400)
RBC: 3.8 MIL/uL — ABNORMAL LOW (ref 4.22–5.81)
RDW: 16 % — ABNORMAL HIGH (ref 11.5–15.5)
WBC: 3.7 10*3/uL — ABNORMAL LOW (ref 4.0–10.5)
nRBC: 0 % (ref 0.0–0.2)

## 2020-10-04 LAB — BASIC METABOLIC PANEL
Anion gap: 8 (ref 5–15)
BUN: 39 mg/dL — ABNORMAL HIGH (ref 8–23)
CO2: 42 mmol/L — ABNORMAL HIGH (ref 22–32)
Calcium: 9.2 mg/dL (ref 8.9–10.3)
Chloride: 89 mmol/L — ABNORMAL LOW (ref 98–111)
Creatinine, Ser: 0.9 mg/dL (ref 0.61–1.24)
GFR, Estimated: >60 mL/min (ref >60)
Glucose, Bld: 107 mg/dL — ABNORMAL HIGH (ref 70–99)
Potassium: 4.1 mmol/L (ref 3.5–5.1)
Sodium: 139 mmol/L (ref 135–145)

## 2020-10-04 LAB — PROTIME-INR
INR: 2.6 — ABNORMAL HIGH (ref 0.8–1.2)
Prothrombin Time: 28.1 seconds — ABNORMAL HIGH (ref 11.4–15.2)

## 2020-10-04 LAB — MAGNESIUM: Magnesium: 2.5 mg/dL — ABNORMAL HIGH (ref 1.7–2.4)

## 2020-10-04 LAB — PHOSPHORUS: Phosphorus: 2.5 mg/dL (ref 2.5–4.6)

## 2020-10-04 IMAGING — DX DG ABD PORTABLE 1V
1 series · 1 of 1 positions shown · non-contrast
Comparison: [DATE]

CLINICAL DATA: NG tube placement.

EXAM:
PORTABLE ABDOMEN - 1 VIEW

[abdomen kub]
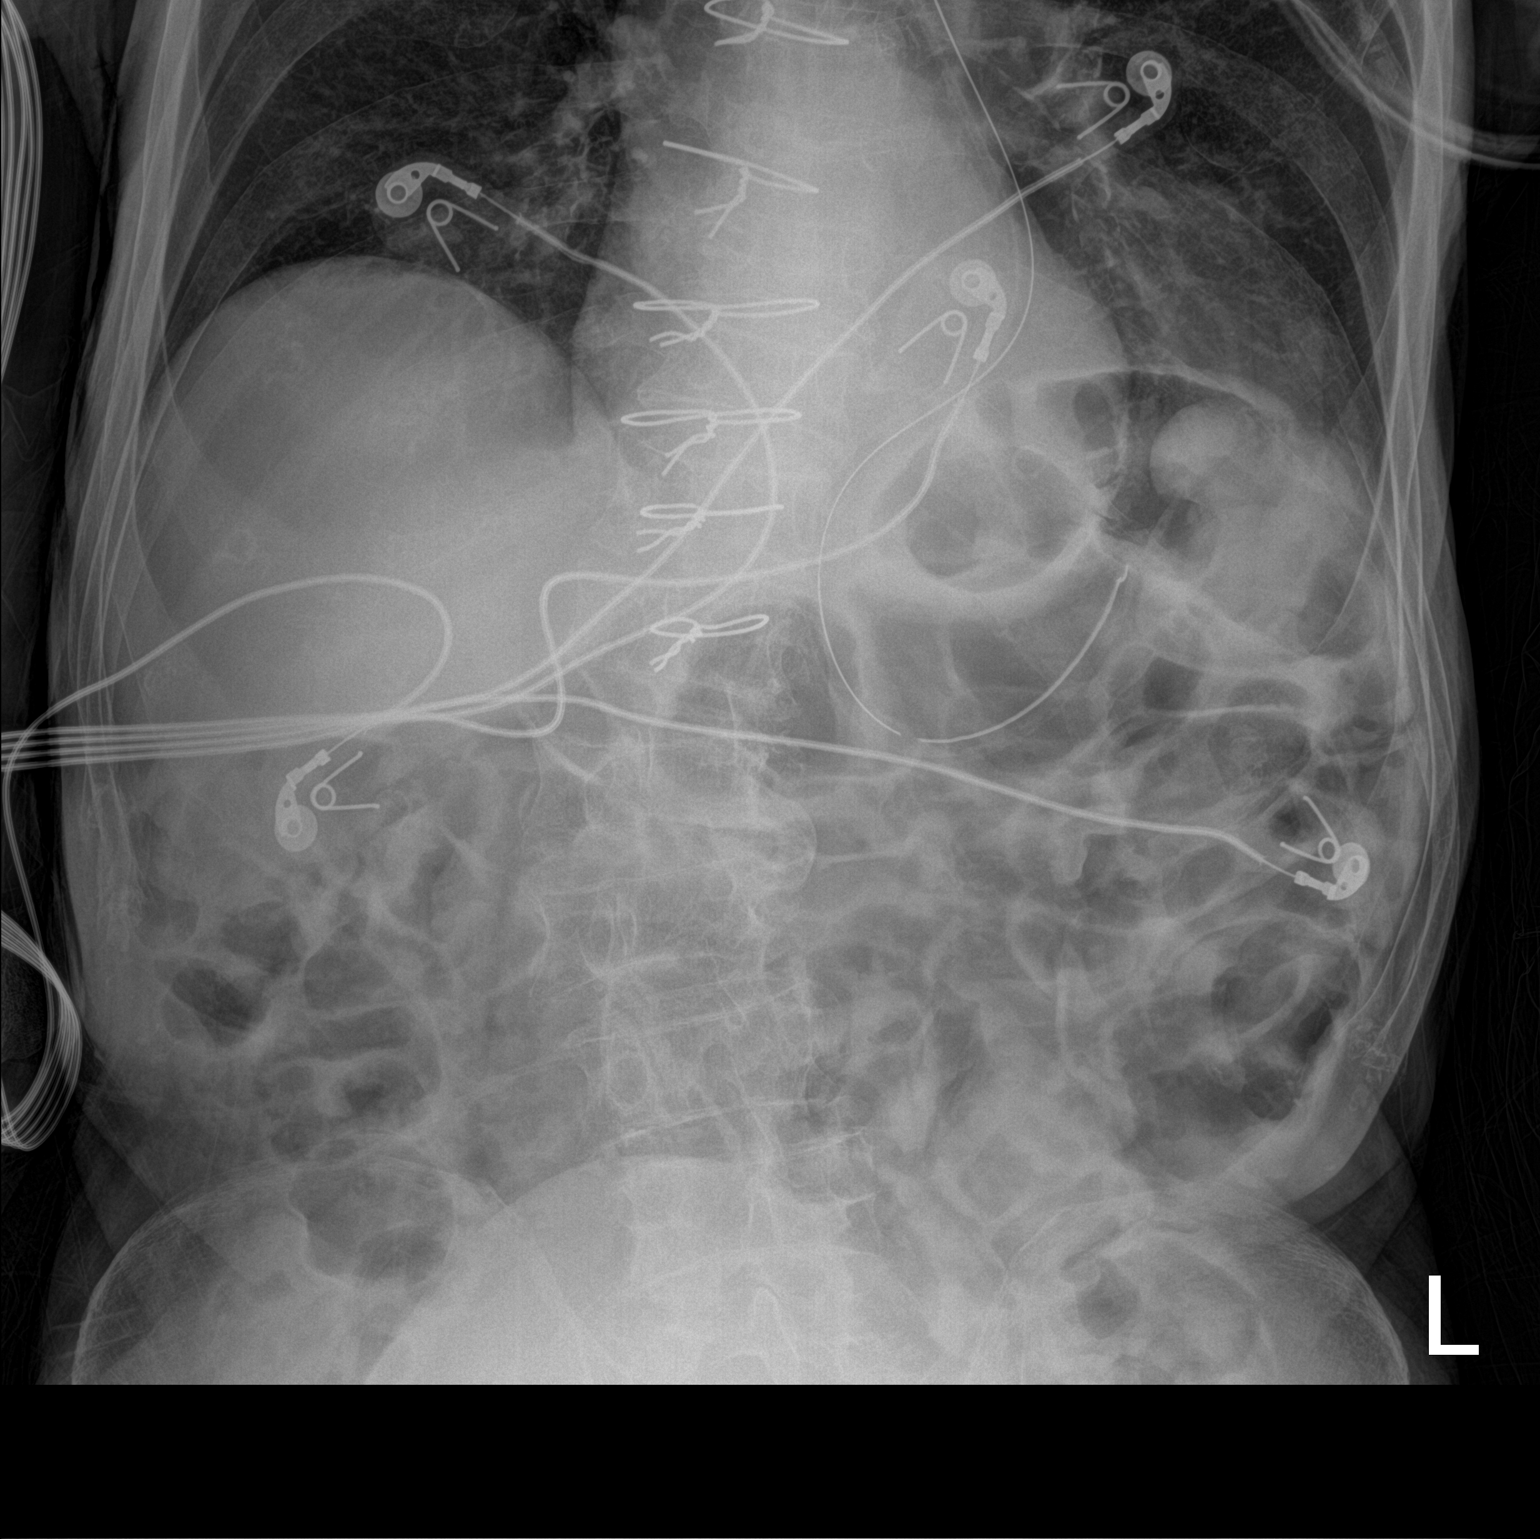

[1 of 1 positions shown; findings below may reference images not displayed]

FINDINGS: A nasogastric tube is seen with the tip within the proximal stomach.
Generalized gaseous distention of small bowel and colon shows no
significant change.
IMPRESSION: Nasogastric tube tip in proximal stomach.

## 2020-10-04 IMAGING — DX DG CHEST 1V PORT
1 series · 1 of 1 positions shown · non-contrast
Comparison: [DATE]

CLINICAL DATA: Aspiration.

EXAM:
PORTABLE CHEST 1 VIEW

[chest ap]
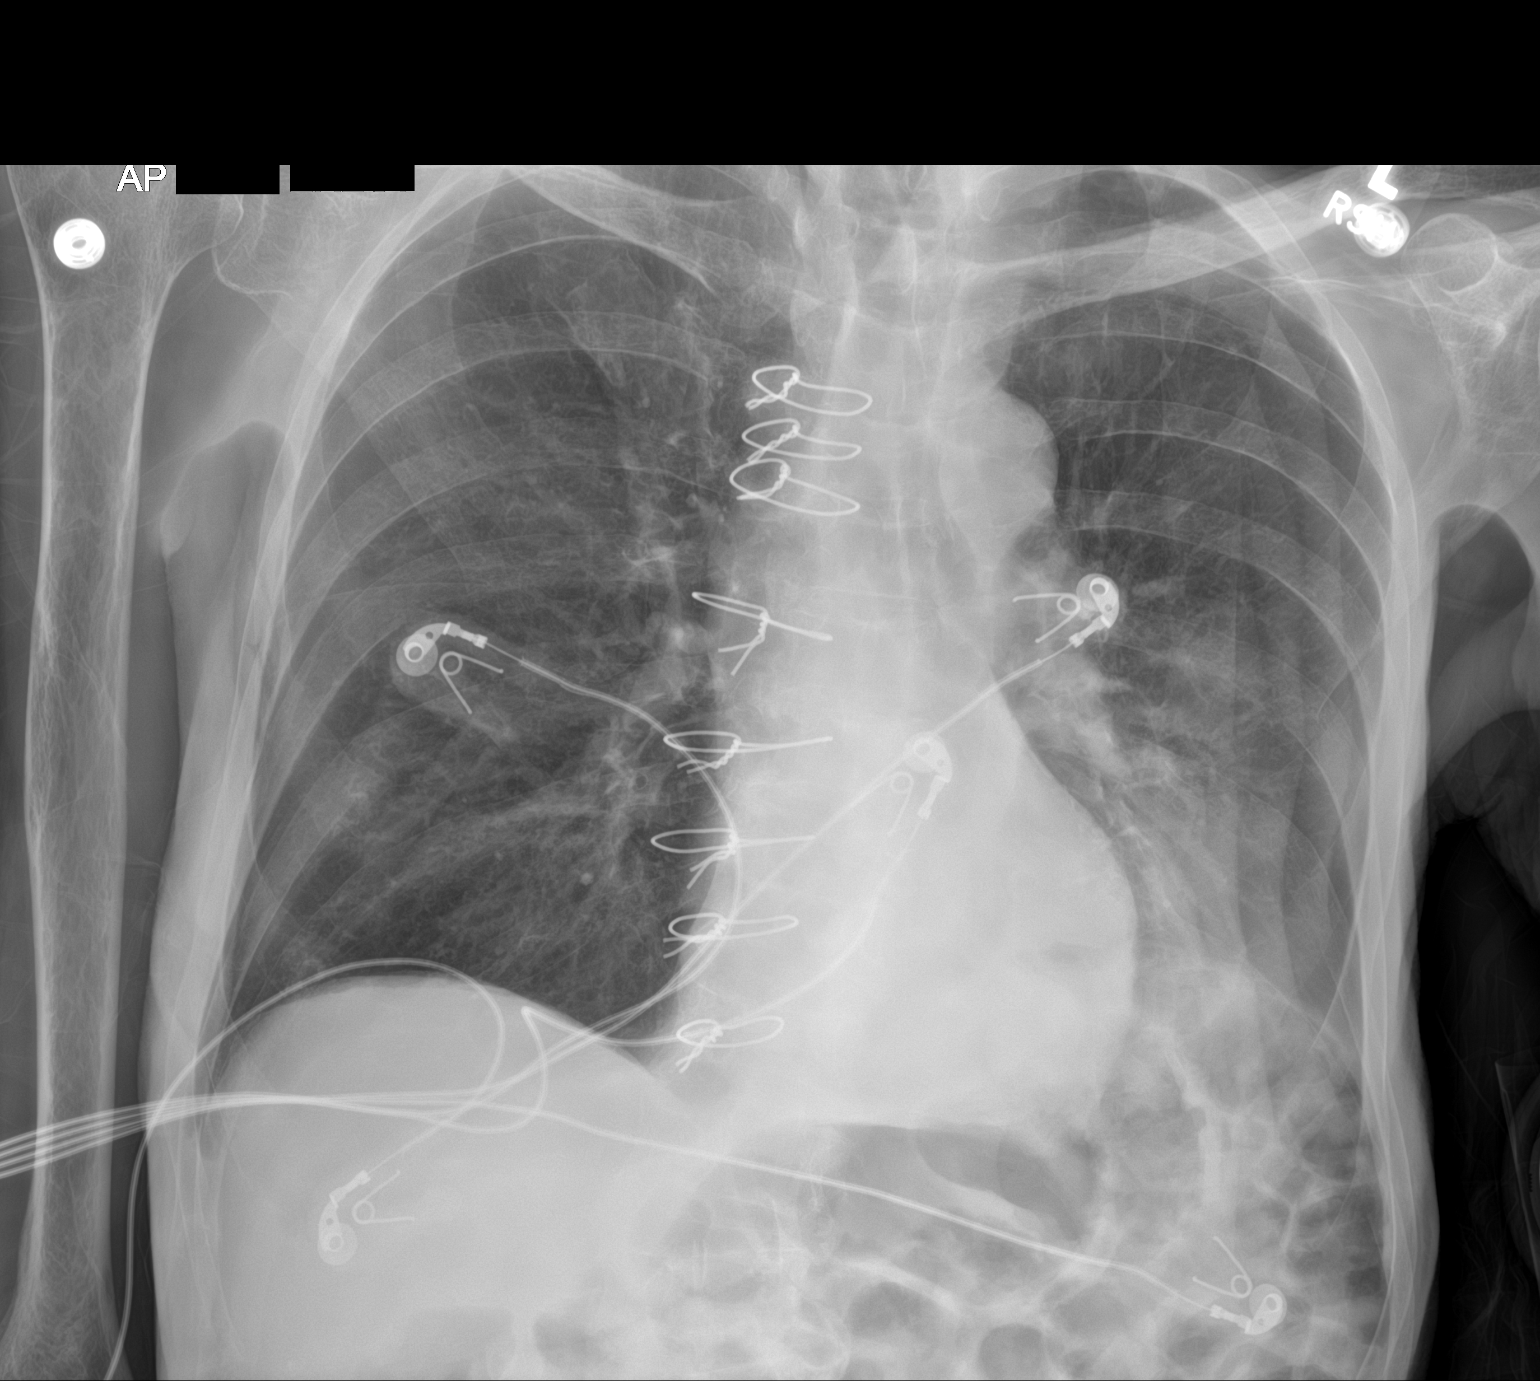

[1 of 1 positions shown; findings below may reference images not displayed]

FINDINGS: Lungs are hyperexpanded. Skin folds overlie the left chest with
probable left base atelectasis or infiltrate. No pulmonary edema or
substantial pleural effusion. The cardiopericardial silhouette is
within normal limits for size. Bones are diffusely demineralized.
Telemetry leads overlie the chest.
IMPRESSION: Hyperexpansion with probable left base atelectasis or infiltrate.
Multiple skin folds overlie the left chest.

## 2020-10-05 ENCOUNTER — Ambulatory Visit (HOSPITAL_COMMUNITY): Payer: Medicare Other

## 2020-10-05 LAB — CBC
HCT: 36.9 % — ABNORMAL LOW (ref 39.0–52.0)
Hemoglobin: 12 g/dL — ABNORMAL LOW (ref 13.0–17.0)
MCH: 32.9 pg (ref 26.0–34.0)
MCHC: 32.5 g/dL (ref 30.0–36.0)
MCV: 101.1 fL — ABNORMAL HIGH (ref 80.0–100.0)
Platelets: 177 10*3/uL (ref 150–400)
RBC: 3.65 MIL/uL — ABNORMAL LOW (ref 4.22–5.81)
RDW: 16 % — ABNORMAL HIGH (ref 11.5–15.5)
WBC: 4.5 10*3/uL (ref 4.0–10.5)
nRBC: 0 % (ref 0.0–0.2)

## 2020-10-05 LAB — BASIC METABOLIC PANEL
Anion gap: 11 (ref 5–15)
BUN: 42 mg/dL — ABNORMAL HIGH (ref 8–23)
CO2: 39 mmol/L — ABNORMAL HIGH (ref 22–32)
Calcium: 9.4 mg/dL (ref 8.9–10.3)
Chloride: 87 mmol/L — ABNORMAL LOW (ref 98–111)
Creatinine, Ser: 0.85 mg/dL (ref 0.61–1.24)
GFR, Estimated: >60 mL/min (ref >60)
Glucose, Bld: 91 mg/dL (ref 70–99)
Potassium: 3.9 mmol/L (ref 3.5–5.1)
Sodium: 137 mmol/L (ref 135–145)

## 2020-10-05 LAB — PROTIME-INR
INR: 2 — ABNORMAL HIGH (ref 0.8–1.2)
Prothrombin Time: 22.6 seconds — ABNORMAL HIGH (ref 11.4–15.2)

## 2020-10-05 LAB — PHOSPHORUS: Phosphorus: 3.1 mg/dL (ref 2.5–4.6)

## 2020-10-05 LAB — MAGNESIUM: Magnesium: 2.5 mg/dL — ABNORMAL HIGH (ref 1.7–2.4)

## 2020-10-06 ENCOUNTER — Other Ambulatory Visit (HOSPITAL_COMMUNITY): Payer: Medicare Other

## 2020-10-06 LAB — PROTIME-INR
INR: 2.3 — ABNORMAL HIGH (ref 0.8–1.2)
Prothrombin Time: 25.2 seconds — ABNORMAL HIGH (ref 11.4–15.2)

## 2020-10-06 IMAGING — CT CT HEAD W/O CM
3 series · 16 of 47 positions shown, 19 images · non-contrast
Comparison: [DATE]

CLINICAL DATA: Mental status change, unknown cause.

EXAM:
CT HEAD WITHOUT CONTRAST
TECHNIQUE: Contiguous axial images were obtained from the base of the skull
through the vertex without intravenous contrast.

[Series 4: head 5.0 h30s · axial · 0.43mm/px · z∈[-138,+7]mm · 10 of 35 slices shown, 13 images]
[im 3/35  brain]
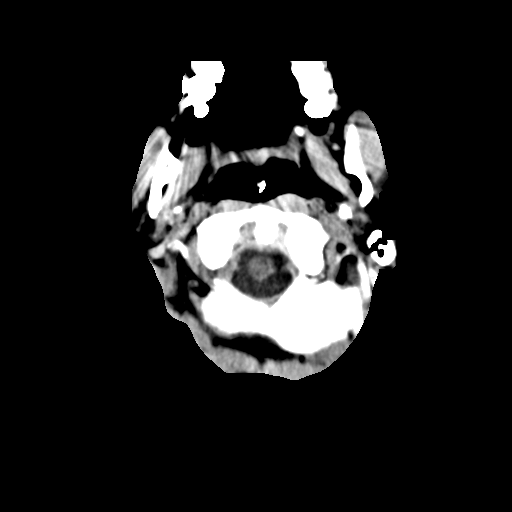
[im 3/35  bone]
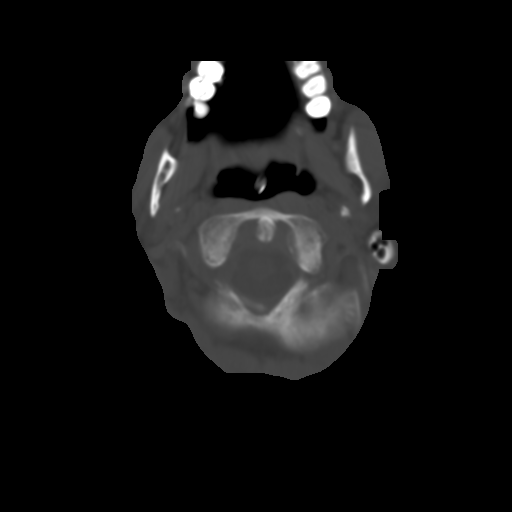
[im 6/35  brain]
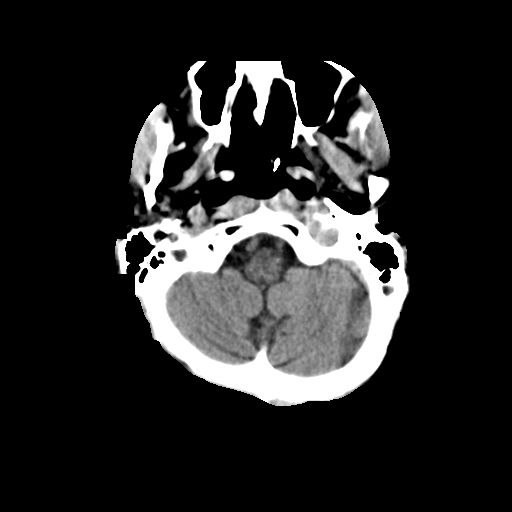
[im 10/35  brain]
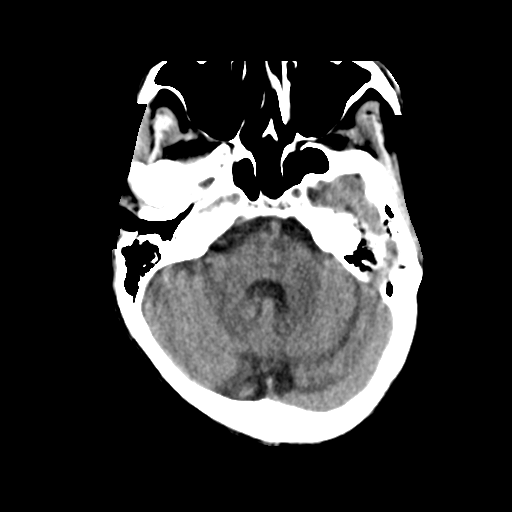
[im 12/35  brain]
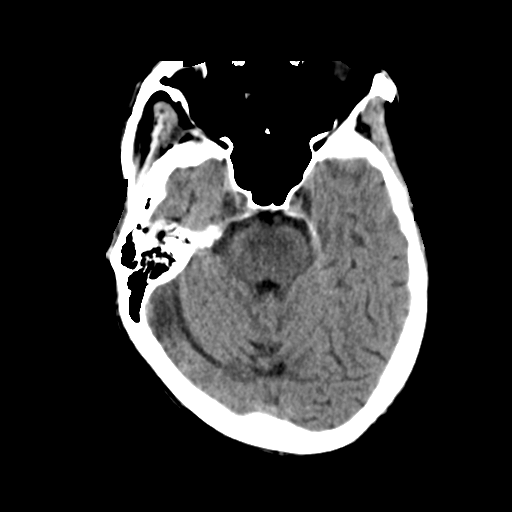
[im 16/35  brain]
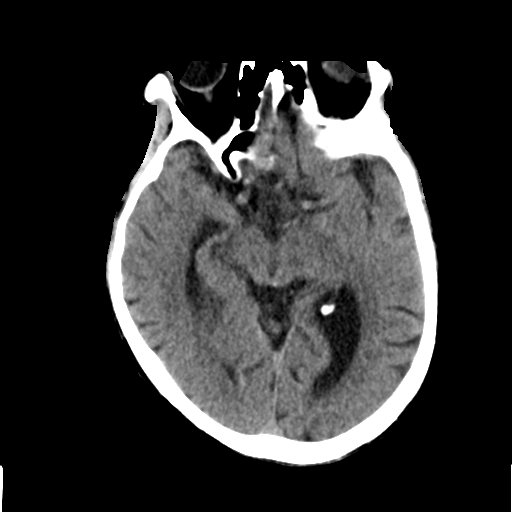
[im 16/35  bone]
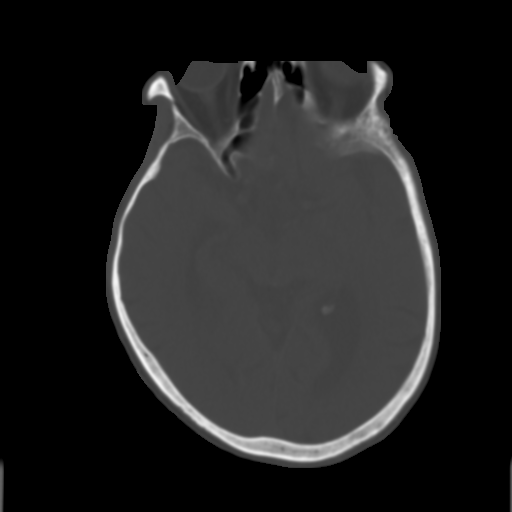
[im 19/35  brain]
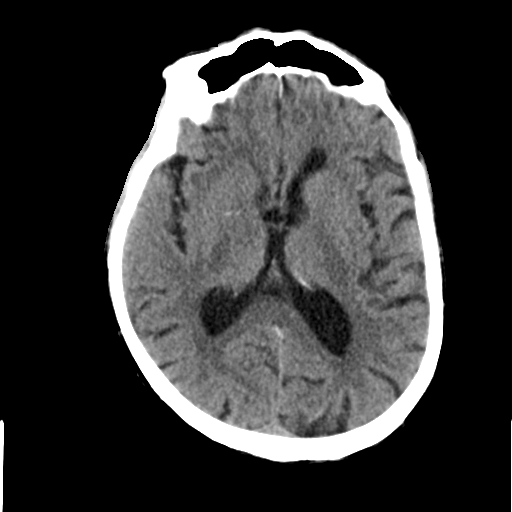
[im 23/35  brain]
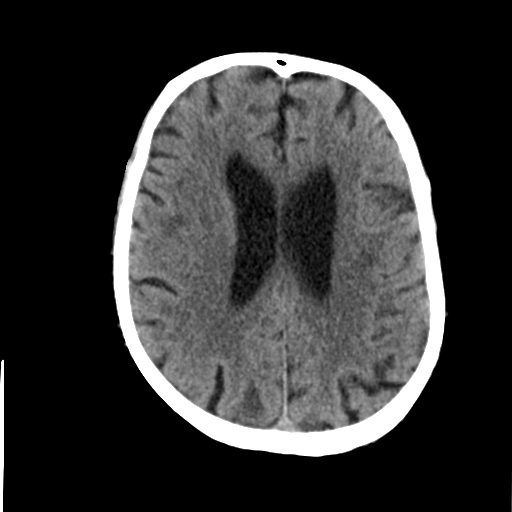
[im 26/35  brain]
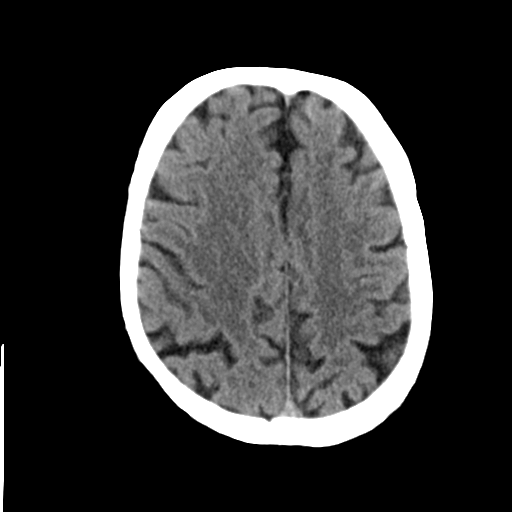
[im 29/35  brain]
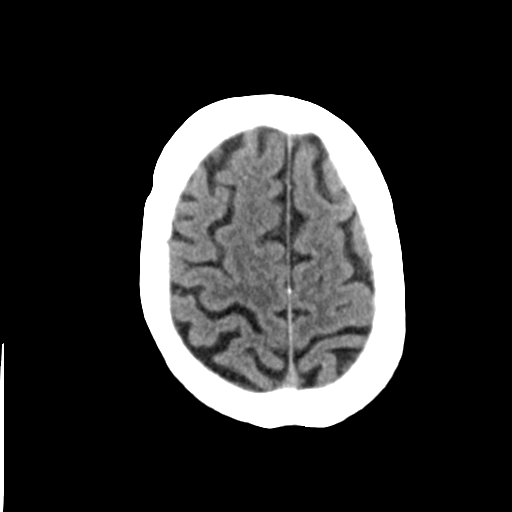
[im 29/35  bone]
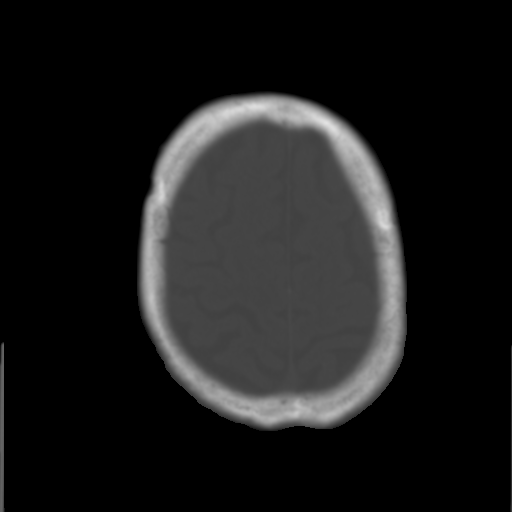
[im 32/35  brain]
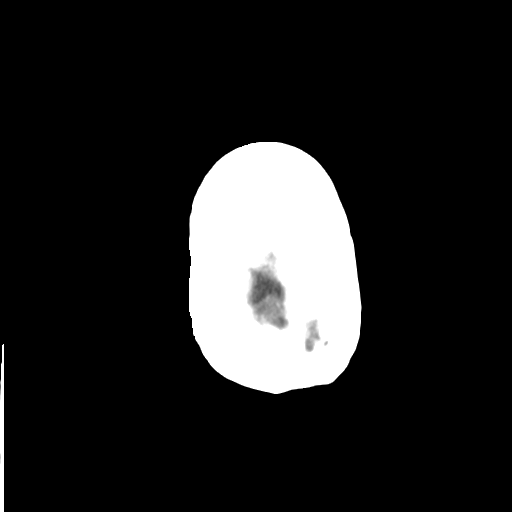

[Series 5: head 3.0 mpr cor · coronal · 0.33mm/px · 3 of 67 slices shown]
[im 24/67  brain]
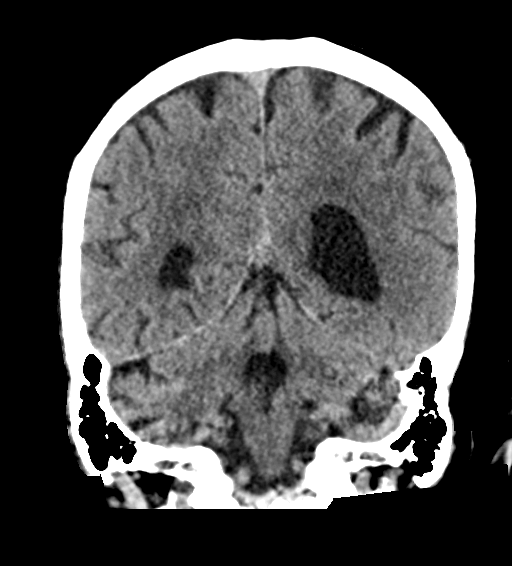
[im 30/67  brain]
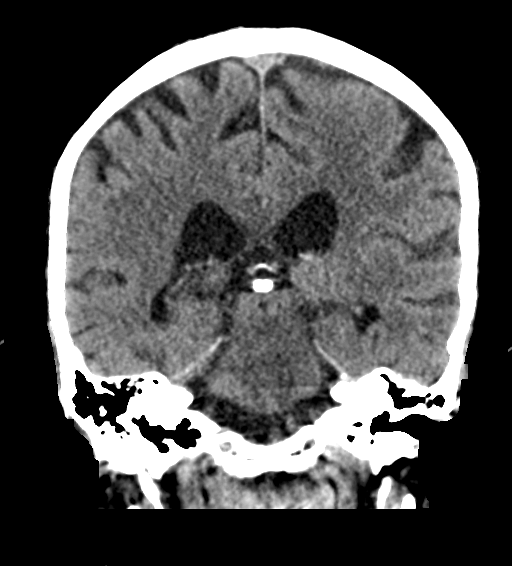
[im 37/67  brain]
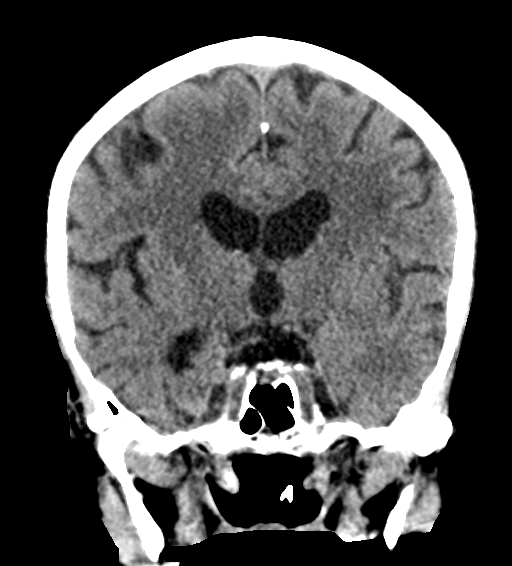

[Series 6: head 3.0 mpr sag · sagittal · 0.36mm/px · 3 of 50 slices shown]
[im 17/50  brain]
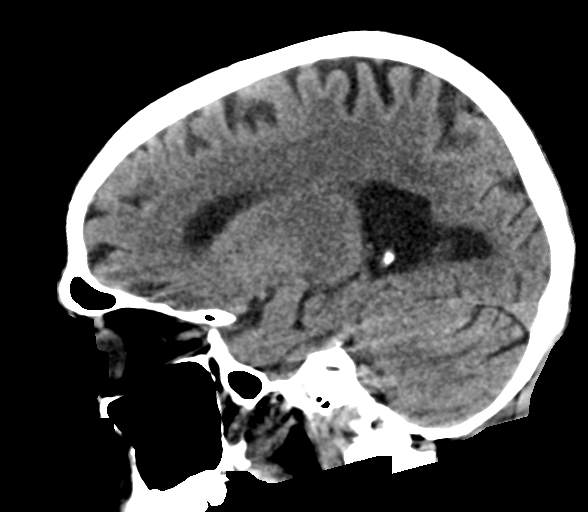
[im 25/50  brain]
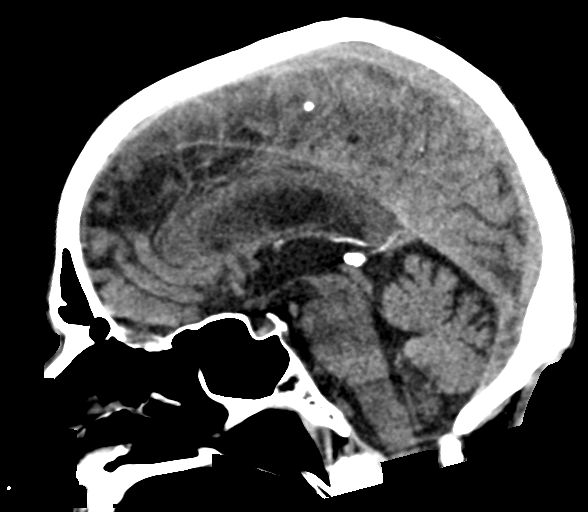
[im 33/50  brain]
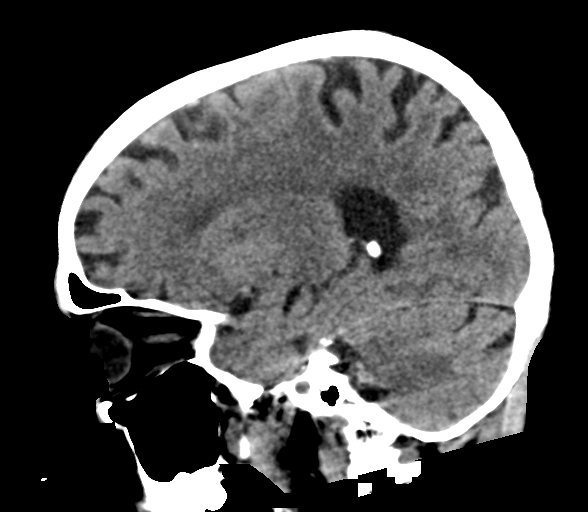

[16 of 47 positions shown; findings below may reference images not displayed]

FINDINGS: Brain: There is no evidence of an acute infarct, intracranial
hemorrhage, mass, midline shift, or extra-axial fluid collection.
Hypodensities in the cerebral white matter bilaterally are similar
to the prior CT and are nonspecific but compatible with mild chronic
small vessel ischemic disease. Mild cerebral atrophy is within
normal limits for age.

Vascular: Calcified atherosclerosis at the skull base. No hyperdense
vessel.

Skull: Chronic appearing nasal bone fractures.

Sinuses/Orbits: Paranasal sinuses and mastoid air cells are clear.
Unremarkable orbits.

Other: None.
IMPRESSION: 1. No evidence of acute intracranial abnormality.
2. Mild chronic small vessel ischemic disease.

## 2020-10-06 IMAGING — CT CT ABDOMEN W/O CM
2 of 4 series · 16 of 46 positions shown, 18 images · non-contrast
Comparison: Prior CT of the abdomen on [DATE] and recent
abdominal plain films.

CLINICAL DATA: Ileus and evaluation anatomy for possible
gastrostomy tube placement.

EXAM:
CT ABDOMEN WITHOUT CONTRAST
TECHNIQUE: Multidetector CT imaging of the abdomen was performed following the
standard protocol without IV contrast.

[Series 3: abd/ pelvis 5.0 i30f 2 · axial · 0.84mm/px · z∈[-632,-392]mm · 13 of 54 slices shown, 15 images]
[im 3/54  soft-tissue]
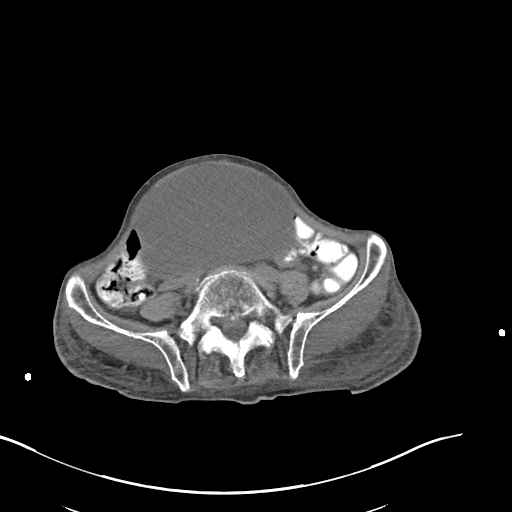
[im 3/54  bone]
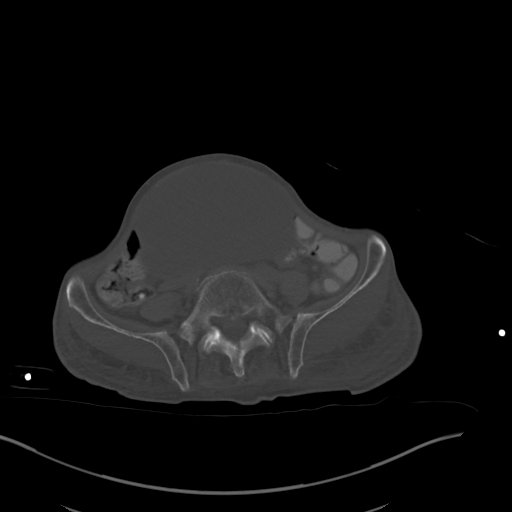
[im 8/54  soft-tissue]
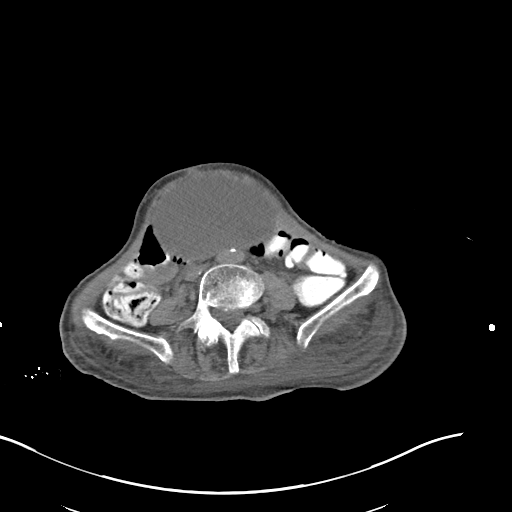
[im 11/54  soft-tissue]
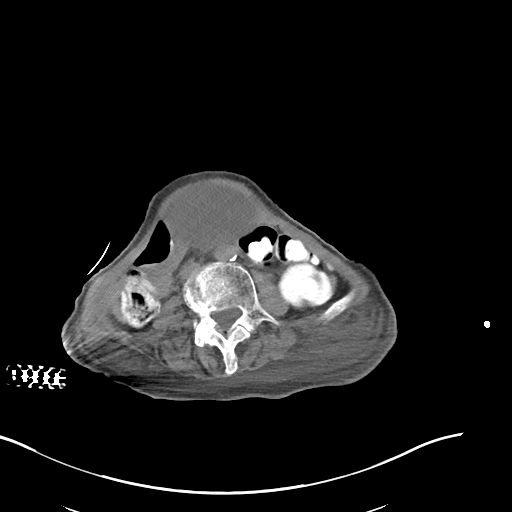
[im 16/54  soft-tissue]
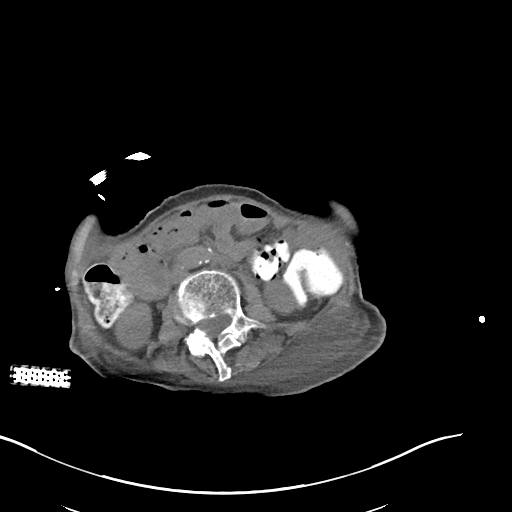
[im 19/54  soft-tissue]
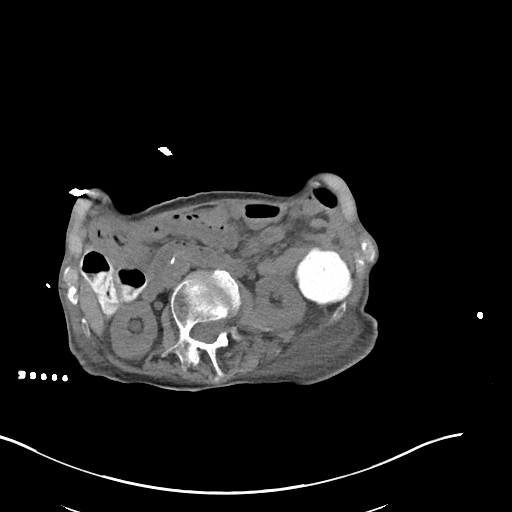
[im 24/54  soft-tissue]
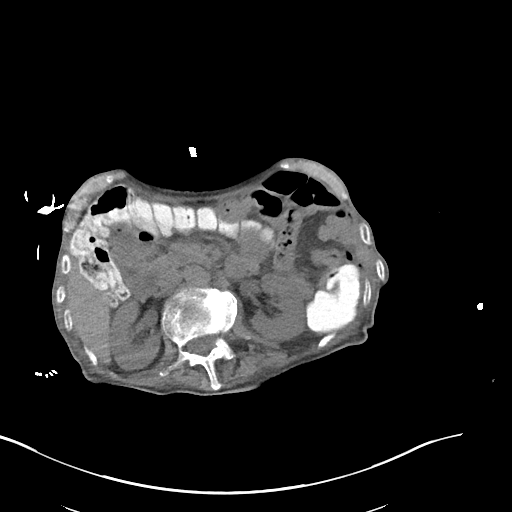
[im 27/54  soft-tissue]
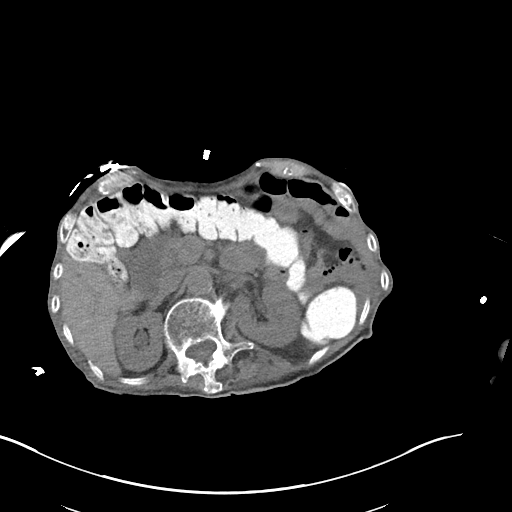
[im 30/54  soft-tissue]
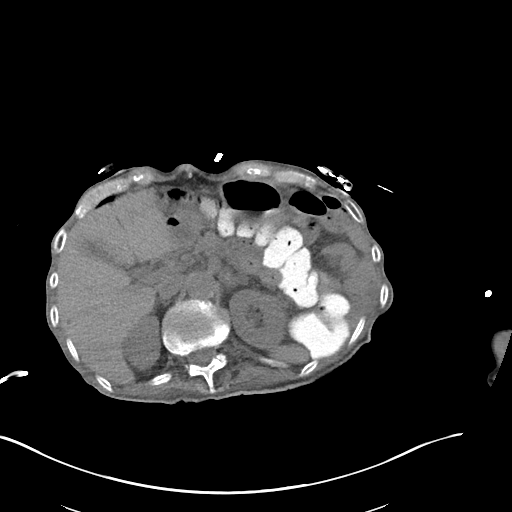
[im 35/54  soft-tissue]
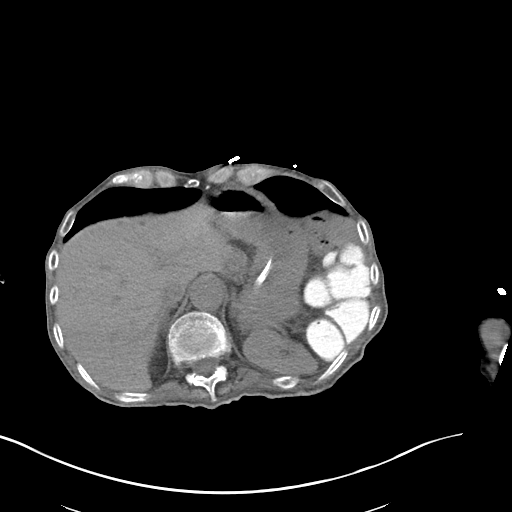
[im 35/54  bone]
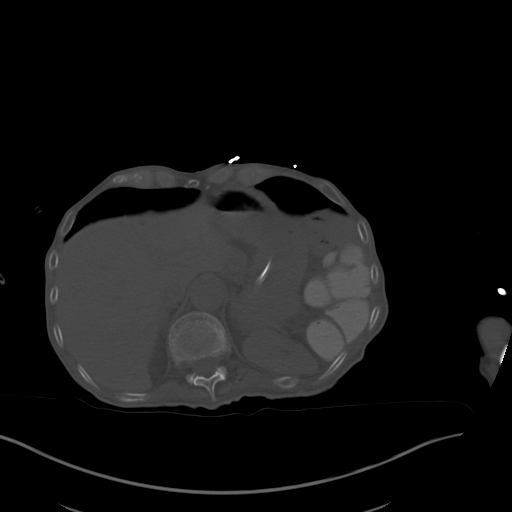
[im 38/54  soft-tissue]
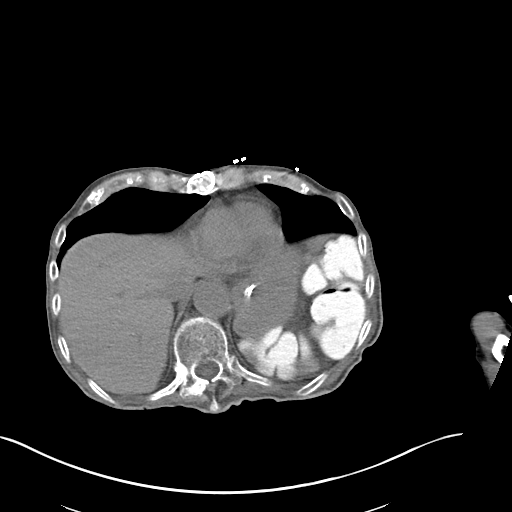
[im 43/54  soft-tissue]
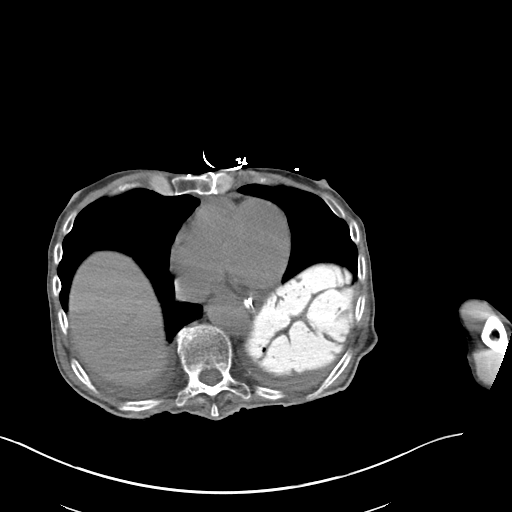
[im 46/54  soft-tissue]
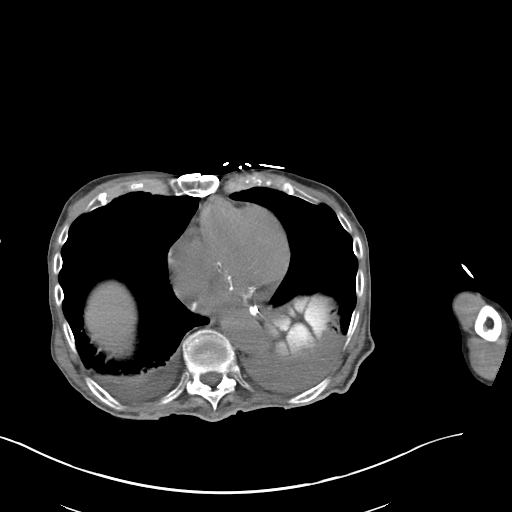
[im 51/54  soft-tissue]
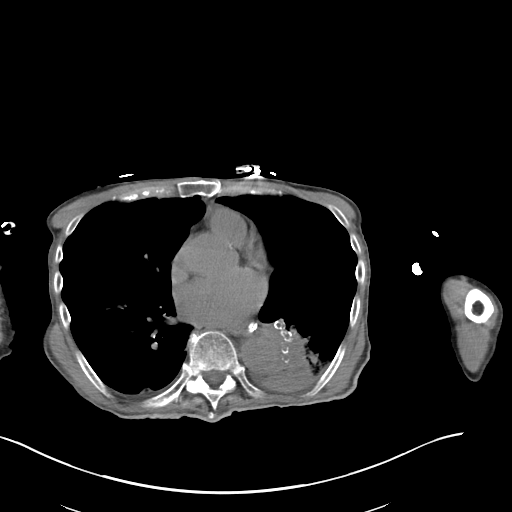

[Series 6: cor st · coronal · 0.59mm/px · 3 of 77 slices shown]
[im 26/77  soft-tissue]
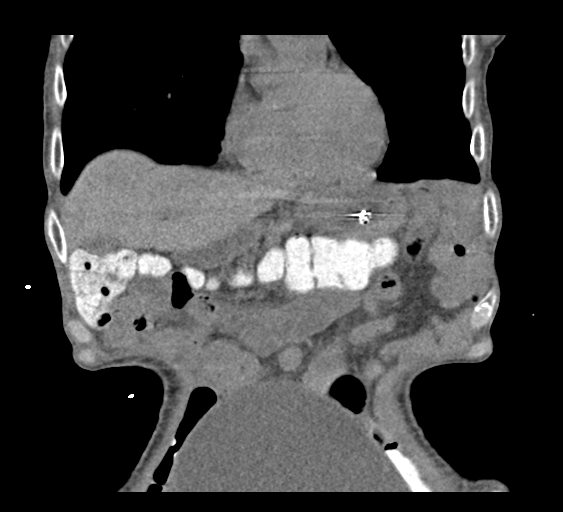
[im 34/77  soft-tissue]
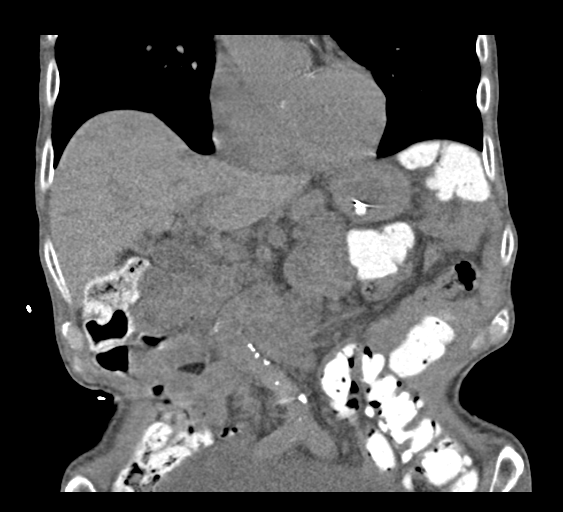
[im 43/77  soft-tissue]
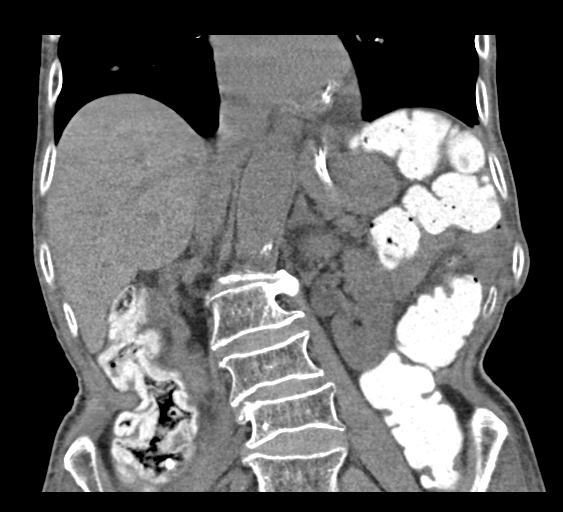

[16 of 46 positions shown; findings below may reference images not displayed]

FINDINGS: Lower chest: Left lower lobe consolidation and bronchial occlusion
suggestive of aspiration pneumonia.

Hepatobiliary: No focal liver abnormality is seen. No gallstones,
gallbladder wall thickening, or biliary dilatation.

Pancreas: Unremarkable. No pancreatic ductal dilatation or
surrounding inflammatory changes.

Spleen: Normal in size without focal abnormality.

Adrenals/Urinary Tract: Adrenal glands are unremarkable. Kidneys are
normal, without renal calculi, focal lesion, or hydronephrosis.
Visualized superior bladder shows significant distension.

Stomach/Bowel: Oral contrast is present in the colon. Improvement in
ileus since the prior CT on [DATE]. A nasogastric tube extends
into the body of the stomach. The stomach is fairly high in position
but shows no abnormal positioning to preclude potential percutaneous
gastrostomy tube placement. The transverse colon is located inferior
to the stomach. No free intraperitoneal air identified.

Vascular/Lymphatic: Calcified plaque in the abdominal aorta without
aneurysm. No enlarged lymph nodes identified.

Other: No hiatal or abdominal wall hernias identified. No evidence
of ascites or abscess.

Musculoskeletal: No acute or significant osseous findings.
IMPRESSION: 1. Improvement in ileus after days a gastric decompression.
2. No anatomic abnormalities that would preclude potential attempt
at percutaneous gastrostomy tube placement.
3. Left lower lobe consolidation and bronchial occlusions suggestive
of aspiration pneumonia.
4. Probable significant bladder distension based on visualized
superior bladder. Correlation suggested with evidence of urinary
retention clinically. Catheterization of the bladder may be helpful.

## 2020-10-07 LAB — MAGNESIUM: Magnesium: 3 mg/dL — ABNORMAL HIGH (ref 1.7–2.4)

## 2020-10-07 LAB — BASIC METABOLIC PANEL
Anion gap: 10 (ref 5–15)
BUN: 56 mg/dL — ABNORMAL HIGH (ref 8–23)
CO2: 38 mmol/L — ABNORMAL HIGH (ref 22–32)
Calcium: 9.9 mg/dL (ref 8.9–10.3)
Chloride: 91 mmol/L — ABNORMAL LOW (ref 98–111)
Creatinine, Ser: 0.9 mg/dL (ref 0.61–1.24)
GFR, Estimated: >60 mL/min (ref >60)
Glucose, Bld: 126 mg/dL — ABNORMAL HIGH (ref 70–99)
Potassium: 4.8 mmol/L (ref 3.5–5.1)
Sodium: 139 mmol/L (ref 135–145)

## 2020-10-07 LAB — PROTIME-INR
INR: 2.7 — ABNORMAL HIGH (ref 0.8–1.2)
Prothrombin Time: 28.5 seconds — ABNORMAL HIGH (ref 11.4–15.2)

## 2020-10-07 LAB — CBC
HCT: 37.3 % — ABNORMAL LOW (ref 39.0–52.0)
Hemoglobin: 12 g/dL — ABNORMAL LOW (ref 13.0–17.0)
MCH: 32.8 pg (ref 26.0–34.0)
MCHC: 32.2 g/dL (ref 30.0–36.0)
MCV: 101.9 fL — ABNORMAL HIGH (ref 80.0–100.0)
Platelets: UNDETERMINED 10*3/uL (ref 150–400)
RBC: 3.66 MIL/uL — ABNORMAL LOW (ref 4.22–5.81)
RDW: 16.1 % — ABNORMAL HIGH (ref 11.5–15.5)
WBC: 6.8 10*3/uL (ref 4.0–10.5)
nRBC: 0 % (ref 0.0–0.2)

## 2020-10-07 LAB — PHOSPHORUS: Phosphorus: 2.8 mg/dL (ref 2.5–4.6)

## 2020-10-08 LAB — RENAL FUNCTION PANEL
Albumin: 2.5 g/dL — ABNORMAL LOW (ref 3.5–5.0)
Anion gap: 6 (ref 5–15)
BUN: 37 mg/dL — ABNORMAL HIGH (ref 8–23)
CO2: 42 mmol/L — ABNORMAL HIGH (ref 22–32)
Calcium: 9.1 mg/dL (ref 8.9–10.3)
Chloride: 96 mmol/L — ABNORMAL LOW (ref 98–111)
Creatinine, Ser: 0.48 mg/dL — ABNORMAL LOW (ref 0.61–1.24)
GFR, Estimated: >60 mL/min (ref >60)
Glucose, Bld: 110 mg/dL — ABNORMAL HIGH (ref 70–99)
Phosphorus: 1.5 mg/dL — ABNORMAL LOW (ref 2.5–4.6)
Potassium: 4.3 mmol/L (ref 3.5–5.1)
Sodium: 144 mmol/L (ref 135–145)

## 2020-10-08 LAB — PROTIME-INR
INR: 2.3 — ABNORMAL HIGH (ref 0.8–1.2)
Prothrombin Time: 25.7 seconds — ABNORMAL HIGH (ref 11.4–15.2)

## 2020-10-08 LAB — CBC
HCT: 34.6 % — ABNORMAL LOW (ref 39.0–52.0)
Hemoglobin: 10.9 g/dL — ABNORMAL LOW (ref 13.0–17.0)
MCH: 32.3 pg (ref 26.0–34.0)
MCHC: 31.5 g/dL (ref 30.0–36.0)
MCV: 102.7 fL — ABNORMAL HIGH (ref 80.0–100.0)
Platelets: 112 10*3/uL — ABNORMAL LOW (ref 150–400)
RBC: 3.37 MIL/uL — ABNORMAL LOW (ref 4.22–5.81)
RDW: 16.1 % — ABNORMAL HIGH (ref 11.5–15.5)
WBC: 5.3 10*3/uL (ref 4.0–10.5)
nRBC: 0 % (ref 0.0–0.2)

## 2020-10-08 LAB — MAGNESIUM: Magnesium: 2.4 mg/dL (ref 1.7–2.4)

## 2020-10-08 NOTE — Progress Notes (Signed)
  Request for G tube in IR  Okayed with Dr Roni Bread to discuss with pt and family He is wanting to discuss with MD about other options other than percutaneous G tube I reported to [facility] NP  She will talk with pt.  Pt is on coumadin INR 2.3 today Would need 1.5 or lower to safely perform percutaneous procedure  Will check back in a few days  All agree

## 2020-10-09 LAB — BASIC METABOLIC PANEL
Anion gap: 11 (ref 5–15)
BUN: 37 mg/dL — ABNORMAL HIGH (ref 8–23)
CO2: 36 mmol/L — ABNORMAL HIGH (ref 22–32)
Calcium: 9.2 mg/dL (ref 8.9–10.3)
Chloride: 98 mmol/L (ref 98–111)
Creatinine, Ser: 0.4 mg/dL — ABNORMAL LOW (ref 0.61–1.24)
GFR, Estimated: >60 mL/min (ref >60)
Glucose, Bld: 107 mg/dL — ABNORMAL HIGH (ref 70–99)
Potassium: 4.3 mmol/L (ref 3.5–5.1)
Sodium: 145 mmol/L (ref 135–145)

## 2020-10-09 LAB — PROTIME-INR
INR: 2.1 — ABNORMAL HIGH (ref 0.8–1.2)
Prothrombin Time: 23.6 seconds — ABNORMAL HIGH (ref 11.4–15.2)

## 2020-10-09 LAB — ACETYLCHOLINE RECEPTOR AB, ALL
Acety choline binding ab: <0.03 nmol/L (ref 0.00–0.24)
Acetylchol Block Ab: 18 % (ref 0–25)

## 2020-10-09 NOTE — Consult Note (Signed)
Chief Complaint: Patient was seen in consultation today for percutaneous gastric tube placement at the request of Dr Owens Shark  Supervising Physician: Aletta Edouard  Patient Status: Select IP  History of Present Illness: Austin Davis is a 80 y.o. male   Hx afib- on coumadin (LD 10/08/20) Essential tremor; OSA Declining health; wt loss; decreased po intake at home Altered mental status SOB and came to ED Hypoxia, dyspnea--- resp failure INR 10.9  Failure to thrive; severe protein calorie malnutrition Dysphagia Aspiration pna Need for long term care  Requesting percutaneous gastric tube placement Imaging approved for procedure   Past Medical History:  Diagnosis Date   CHF (congestive heart failure) ([place])    Coronary artery disease    GI bleed 2019   hospitalized at [facility] for one week   Headache    since childhood   Low blood sugar    since childhood, controlled by diet   Mitral regurgitation    Mitral valve prolapse    Osteopenia 2021   Paroxysmal atrial fibrillation ([place])     Past Surgical History:  Procedure Laterality Date   COLONOSCOPY WITH PROPOFOL N/A 02/19/2017   Procedure: COLONOSCOPY WITH PROPOFOL;  Surgeon: Wilford Corner, MD;  Location: [facility] ENDOSCOPY;  Service: Endoscopy;  Laterality: N/A;   laser eye surgery for retina detachment     MITRAL VALVE REPAIR  01/2003   monitor  02/05/2006   polyp removal     TONSILLECTOMY     tooth removal     as a teenager    Allergies: Prednisone and Cortisone  Medications: Prior to Admission medications   Medication Sig Start Date End Date Taking? Authorizing Provider  acetaminophen (TYLENOL) 500 MG tablet Take 500 mg by mouth every 6 (six) hours as needed for mild pain or headache.    [provider]  ALPRAZolam Duanne Moron) 0.25 MG tablet Take 0.125 mg by mouth at bedtime as needed for anxiety.    [provider]  Ascorbic Acid (VITAMIN C IMMUNE HEALTH PO) Take 500 mg by mouth.     [provider]  B Complex Vitamins (VITAMIN B COMPLEX PO) Take 1 tablet by mouth daily.    [provider]  Cholecalciferol (D3) 50 MCG (2000 UT) TABS Take 2,000 Units by mouth daily.    [provider]  diltiazem (CARDIZEM) 30 MG tablet Take 1 tablet (30 mg total) by mouth every 12 (twelve) hours. 09/28/20   [name], MD  Ensure Max Protein (ENSURE MAX PROTEIN) LIQD Take 330 mLs (11 oz total) by mouth daily. 09/28/20 10/28/20  [name], MD  feeding supplement (ENSURE ENLIVE / ENSURE PLUS) LIQD Take 237 mLs by mouth 2 (two) times daily between meals. 09/28/20   [name], MD  folic acid (FOLVITE) 1 MG tablet Take 1 tablet (1 mg total) by mouth daily. 09/29/20   [name], MD  Magnesium 250 MG TABS Take 250 mg by mouth daily.    [provider]  midodrine (PROAMATINE) 5 MG tablet Take 1 tablet (5 mg total) by mouth 3 (three) times daily with meals. 09/28/20   [name], MD  mirtazapine (REMERON) 7.5 MG tablet Take 1 tablet (7.5 mg total) by mouth at bedtime. 09/28/20   [name], MD  Multiple Vitamin (MULTIVITAMIN WITH MINERALS) TABS tablet Take 1 tablet by mouth daily. 09/29/20   [name], MD  Omega-3 Fatty Acids (FISH OIL PO) Take 1 capsule by mouth daily.  [provider]  pantoprazole (PROTONIX) 40 MG tablet Take 1 tablet (40 mg total) by mouth 2 (two) times daily. 09/28/20   [name], MD  predniSONE (DELTASONE) 10 MG tablet Take 4 tablets (40 mg total) by mouth daily for 3 days, THEN 3 tablets (30 mg total) daily for 3 days, THEN 2 tablets (20 mg total) daily for 3 days, THEN 1 tablet (10 mg total) daily for 3 days. 09/28/20 10/10/20  [name], MD  senna-docusate (SENOKOT-S) 8.6-50 MG tablet Take 1 tablet by mouth 2 (two) times daily. 09/28/20   [name], MD  warfarin (COUMADIN) 1 MG tablet Take 1 tablet (1 mg total) by mouth daily. Note do not take  warfarin 9/10 or 9/11 and call warfarin clinical for plan on 9/12 09/29/20   [name], MD  warfarin (COUMADIN) 5 MG tablet Note, do not take warfarin 9/10 or 9/11 and call warfarin clinic for plan on 9/12. TAKE 1/2 TO 1 TABLET DAILY AS DIRECTED BY COUMADIN CLINIC. 09/29/20   [name], MD     Family History  Problem Relation Age of Onset   Heart disease Mother    CAD Mother    Migraines Mother    Heart failure Father    Skin cancer Father    Valvular heart disease Father        prolapsed valve   Tremor Father        "probably had a bit of tremor in his old age"   Heart failure Maternal Grandmother    Pneumonia Maternal Grandfather    Heart failure Paternal Grandmother    Stroke Paternal Grandfather    Asthma Daughter    Epilepsy Son    Parkinson's disease Neg Hx     Social History   Socioeconomic History   Marital status: Single    Spouse name: Not on file   Number of children: 2   Years of education: 16   Highest education level: Not on file  Occupational History   Occupation: [employer]  Tobacco Use   Smoking status: Never   Smokeless tobacco: Never   Tobacco comments:    only tried a few cigarettes when he was younger  Substance and Sexual Activity   Alcohol use: Yes    Comment: "probably a 6 pack of beer a year"   Drug use: No   Sexual activity: Not on file  Other Topics Concern   Not on file  Social History Narrative   Lives with girlfriend   Caffeine use: rarely    Right handed   Social Determinants of Health   Financial Resource Strain: Not on file  Food Insecurity: Not on file  Transportation Needs: Not on file  Physical Activity: Not on file  Stress: Not on file  Social Connections: Not on file    Review of Systems: A 12 point ROS discussed and pertinent positives are indicated in the HPI above.  All other systems are negative.    Vital Signs: There were no vitals taken for this visit.  Physical Exam Vitals  reviewed.  HENT:     Mouth/Throat:     Mouth: Mucous membranes are moist.  Cardiovascular:     Rate and Rhythm: Normal rate. Rhythm irregular.     Pulses: Normal pulses.  Pulmonary:     Breath sounds: Wheezing present.  Abdominal:     Palpations: Abdomen is soft.  Skin:    General: Skin is warm.  Neurological:  Mental Status: He is alert and oriented to person, place, and time.  Psychiatric:        Behavior: Behavior normal.     Comments: Pt is alert A/O Weak but able to answer all questions appropriately Son at bedside    Imaging: CT ABDOMEN WO CONTRAST  Result Date: 10/06/2020 CLINICAL DATA:  Ileus and evaluation anatomy for possible gastrostomy tube placement. EXAM: CT ABDOMEN WITHOUT CONTRAST TECHNIQUE: Multidetector CT imaging of the abdomen was performed following the standard protocol without IV contrast. COMPARISON:  Prior CT of the abdomen on 09/22/2020 and recent abdominal plain films. FINDINGS: Lower chest: Left lower lobe consolidation and bronchial occlusion suggestive of aspiration pneumonia. Hepatobiliary: No focal liver abnormality is seen. No gallstones, gallbladder wall thickening, or biliary dilatation. Pancreas: Unremarkable. No pancreatic ductal dilatation or surrounding inflammatory changes. Spleen: Normal in size without focal abnormality. Adrenals/Urinary Tract: Adrenal glands are unremarkable. Kidneys are normal, without renal calculi, focal lesion, or hydronephrosis. Visualized superior bladder shows significant distension. Stomach/Bowel: Oral contrast is present in the colon. Improvement in ileus since the prior CT on 09/22/2020. A nasogastric tube extends into the body of the stomach. The stomach is fairly high in position but shows no abnormal positioning to preclude potential percutaneous gastrostomy tube placement. The transverse colon is located inferior to the stomach. No free intraperitoneal air identified. Vascular/Lymphatic: Calcified plaque in the  abdominal aorta without aneurysm. No enlarged lymph nodes identified. Other: No hiatal or abdominal wall hernias identified. No evidence of ascites or abscess. Musculoskeletal: No acute or significant osseous findings. IMPRESSION: 1. Improvement in ileus after days a gastric decompression. 2. No anatomic abnormalities that would preclude potential attempt at percutaneous gastrostomy tube placement. 3. Left lower lobe consolidation and bronchial occlusions suggestive of aspiration pneumonia. 4. Probable significant bladder distension based on visualized superior bladder. Correlation suggested with evidence of urinary retention clinically. Catheterization of the bladder may be helpful. Electronically Signed   By: Aletta Edouard M.D.   On: 10/06/2020 15:01   DG Chest 2 View  Result Date: 09/16/2020 CLINICAL DATA:  sob EXAM: CHEST - 2 VIEW COMPARISON:  June 30, 2020 FINDINGS: The cardiomediastinal silhouette is unchanged in contour.Tortuous thoracic aorta. Status post median sternotomy. No pleural effusion. No pneumothorax. No acute pleuroparenchymal abnormality. Visualized abdomen is unremarkable. Compression fracture deformity of the mid to inferior thoracic spine, unchanged. IMPRESSION: No acute cardiopulmonary abnormality. Electronically Signed   By: Valentino Saxon M.D.   On: 09/16/2020 15:35   CT HEAD WO CONTRAST (5MM)  Result Date: 10/06/2020 CLINICAL DATA:  Mental status change, unknown cause. EXAM: CT HEAD WITHOUT CONTRAST TECHNIQUE: Contiguous axial images were obtained from the base of the skull through the vertex without intravenous contrast. COMPARISON:  09/21/2020 FINDINGS: Brain: There is no evidence of an acute infarct, intracranial hemorrhage, mass, midline shift, or extra-axial fluid collection. Hypodensities in the cerebral white matter bilaterally are similar to the prior CT and are nonspecific but compatible with mild chronic small vessel ischemic disease. Mild cerebral atrophy is within  normal limits for age. Vascular: Calcified atherosclerosis at the skull base. No hyperdense vessel. Skull: Chronic appearing nasal bone fractures. Sinuses/Orbits: Paranasal sinuses and mastoid air cells are clear. Unremarkable orbits. Other: None. IMPRESSION: 1. No evidence of acute intracranial abnormality. 2. Mild chronic small vessel ischemic disease. Electronically Signed   By: Logan Bores M.D.   On: 10/06/2020 14:16   CT HEAD WO CONTRAST (5MM)  Result Date: 09/21/2020 CLINICAL DATA:  Vertigo, central. EXAM: CT HEAD WITHOUT  CONTRAST TECHNIQUE: Contiguous axial images were obtained from the base of the skull through the vertex without intravenous contrast. COMPARISON:  None. FINDINGS: Brain: Mild generalized cerebral and cerebellar atrophy. Mild patchy and ill-defined hypoattenuation within the cerebral white matter, nonspecific but compatible with chronic small vessel ischemic disease. There is no acute intracranial hemorrhage. No demarcated cortical infarct. No extra-axial fluid collection. No evidence of an intracranial mass. No midline shift. Vascular: No hyperdense vessel.  Atherosclerotic calcifications Skull: Normal. Negative for fracture or focal lesion. Sinuses/Orbits: Visualized orbits show no acute finding. No significant paranasal sinus disease at the imaged levels. IMPRESSION: No evidence of acute intracranial abnormality. Mild chronic small-vessel ischemic changes within the cerebral white matter. Mild generalized parenchymal atrophy. Electronically Signed   By: Kellie Simmering D.O.   On: 09/21/2020 14:09   CT CHEST WO CONTRAST  Result Date: 09/18/2020 CLINICAL DATA:  Respiratory failure and uncontrollable tremors. EXAM: CT CHEST WITHOUT CONTRAST TECHNIQUE: Multidetector CT imaging of the chest was performed following the standard protocol without IV contrast. COMPARISON:  None. FINDINGS: Cardiovascular: There is mild calcification of the aortic arch. Normal heart size with moderate to marked  severity coronary artery calcification. No pericardial effusion. Mediastinum/Nodes: No enlarged mediastinal or axillary lymph nodes. Thyroid gland, trachea, and esophagus demonstrate no significant findings. Lungs/Pleura: Mild scarring and/or atelectasis is seen within the bilateral apices and posterior aspects of the bilateral lung bases. Small bilateral pleural effusions are noted. No pneumothorax is identified. Upper Abdomen: No acute abnormality. Musculoskeletal: Multiple sternal wires are present. A chronic compression fracture deformity is seen at the level of T6 and T10. IMPRESSION: 1. Small bilateral pleural effusions. 2. Mild biapical and mild bibasilar scarring and/or atelectasis. 3. Moderate to marked severity coronary artery calcification. 4. 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 5. Aortic atherosclerosis. Aortic Atherosclerosis (ICD10-I70.0). Electronically Signed   By: Virgina Norfolk M.D.   On: 09/18/2020 20:05   CT ABDOMEN PELVIS W CONTRAST  Result Date: 09/22/2020 CLINICAL DATA:  Unintentional weight loss, unable to eat or drink EXAM: CT ABDOMEN AND PELVIS WITH CONTRAST TECHNIQUE: Multidetector CT imaging of the abdomen and pelvis was performed using the standard protocol following bolus administration of intravenous contrast. CONTRAST:  30mL OMNIPAQUE IOHEXOL 350 MG/ML SOLN COMPARISON:  05/10/2020 FINDINGS: Lower chest: Median sternotomy has been performed. Cardiac size within normal limits. No pericardial effusion. Small bilateral pleural effusions have developed with associated bibasilar compressive atelectasis. Hepatobiliary: Tiny scattered cysts are seen within the liver, stable since prior examination. The liver is otherwise unremarkable. No intra or extrahepatic biliary ductal dilation. Gallbladder unremarkable. Pancreas: Unremarkable Spleen: Unremarkable Adrenals/Urinary Tract: The adrenal glands are unremarkable. The kidneys are normal. Foley catheter  balloon is seen within a decompressed bladder lumen. Previously noted calculi within the bladder lumen has been largely evacuated though a single 1-2 mm calculus remains within the dependent right bladder lumen. Stomach/Bowel: Moderate stool noted throughout the colon. Large volume stool noted within the rectal vault. No definite evidence of obstruction. The stomach, small bowel, and large bowel are otherwise unremarkable. Appendix is normal. No free intraperitoneal gas or fluid. Vascular/Lymphatic: Mild aortoiliac atherosclerotic calcification. No aortic aneurysm. No pathologic adenopathy within the abdomen and pelvis. Reproductive: Mild prostatic enlargement is stable. Other: No abdominal wall hernia. Interval development of mild subcutaneous edema within the abdominal wall, particularly within the flanks bilaterally. Musculoskeletal: No acute bone abnormality. Osseous structures are diffusely osteopenic. Chronic appearing T10 compression fracture noted. No lytic or blastic bone lesions noted.  IMPRESSION: Interval development of small bilateral pleural effusions and diffuse body wall subcutaneous edema in keeping with mild anasarca. Large volume stool within the rectal vault. Clinical correlation for fecal impaction may be helpful. Superimposed moderate stool throughout the colon without evidence of obstruction. Minimal residual bladder calculi. Interval Foley decompression of the bladder. Aortic Atherosclerosis (ICD10-I70.0). Electronically Signed   By: Fidela Salisbury M.D.   On: 09/22/2020 20:41   DG Chest Port 1 View  Result Date: 10/04/2020 CLINICAL DATA:  Aspiration. EXAM: PORTABLE CHEST 1 VIEW COMPARISON:  10/03/2020 FINDINGS: Lungs are hyperexpanded. Skin folds overlie the left chest with probable left base atelectasis or infiltrate. No pulmonary edema or substantial pleural effusion. The cardiopericardial silhouette is within normal limits for size. Bones are diffusely demineralized. Telemetry leads  overlie the chest. IMPRESSION: Hyperexpansion with probable left base atelectasis or infiltrate. Multiple skin folds overlie the left chest. Electronically Signed   By: Misty Stanley M.D.   On: 10/04/2020 12:38   DG Chest Port 1 View  Result Date: 10/03/2020 CLINICAL DATA:  Altered mental status.  Shortness of breath. EXAM: PORTABLE CHEST 1 VIEW COMPARISON:  10/01/2020 FINDINGS: 1224 hours. The lungs are clear without focal pneumonia, edema, pneumothorax or pleural effusion. Interstitial markings are diffusely coarsened with chronic features. The cardiopericardial silhouette is within normal limits for size. Bones are diffusely demineralized. Telemetry leads overlie the chest. Stomach is markedly distended with gas. Bowel distention also noted in the visualized upper abdomen. IMPRESSION: 1. No acute cardiopulmonary findings. 2. Marked gaseous distention of the stomach. Electronically Signed   By: Misty Stanley M.D.   On: 10/03/2020 13:00   DG CHEST PORT 1 VIEW  Result Date: 10/01/2020 CLINICAL DATA:  Respiratory failure. EXAM: PORTABLE CHEST 1 VIEW COMPARISON:  09/16/2020 FINDINGS: 0528 hours. The lungs are clear without focal pneumonia, edema, pneumothorax or pleural effusion. The cardiopericardial silhouette is within normal limits for size. Skin fold overlies the left lower lung. The visualized bony structures of the thorax show no acute abnormality. Telemetry leads overlie the chest. IMPRESSION: No active disease. Electronically Signed   By: Misty Stanley M.D.   On: 10/01/2020 05:51   DG Abd Portable 1V  Result Date: 10/04/2020 CLINICAL DATA:  NG tube placement. EXAM: PORTABLE ABDOMEN - 1 VIEW COMPARISON:  09/30/2020 FINDINGS: A nasogastric tube is seen with the tip within the proximal stomach. Generalized gaseous distention of small bowel and colon shows no significant change. IMPRESSION: Nasogastric tube tip in proximal stomach. Electronically Signed   By: Marlaine Hind M.D.   On: 10/04/2020 18:23    DG Abd Portable 1V  Result Date: 09/30/2020 CLINICAL DATA:  Abdominal distension evaluate for ileus. EXAM: PORTABLE ABDOMEN - 1 VIEW COMPARISON:  None. FINDINGS: Extensive bowel content is identified throughout colon specially in the right colon and in the rectum. There is no small bowel obstruction. Scoliosis of the spine is noted. Degenerative joint changes of the spine are noted. IMPRESSION: Extensive bowel content is identified throughout colon specially in the right colon and in the rectum consistent with constipation. Electronically Signed   By: Abelardo Diesel M.D.   On: 09/30/2020 08:36   ECHOCARDIOGRAM COMPLETE  Result Date: 09/17/2020    ECHOCARDIOGRAM REPORT   Patient Name:   Austin Davis Date of Exam: 09/17/2020 Medical Rec #:  [id number]        Height:       67.0 in Accession #:    [id number]       Weight:  100.0 lb Date of Birth:  March 01, 1940       BSA:          1.506 m Patient Age:    73 years         BP:           149/130 mmHg Patient Gender: M                HR:           94 bpm. Exam Location:  Inpatient Procedure: 2D Echo, 3D Echo, Cardiac Doppler and Color Doppler Indications:    R06.02 SOB  History:        Patient has prior history of Echocardiogram examinations, most                 recent 10/14/2019. Abnormal ECG, Mitral Valve Disease,                 Arrythmias:Tachycardia and Atrial Fibrillation,                 Signs/Symptoms:Hypotension, Shortness of Breath and Dyspnea;                 Risk Factors:Sleep Apnea and Dyslipidemia. Maze procedure.                  Mitral Valve: prosthetic annuloplasty ring valve is present in                 the mitral position. Procedure Date: 2005.  Sonographer:    Roseanna Rainbow RDCS Referring Phys: [id number] [place] [facility]  Sonographer Comments: Technically difficult study due to poor echo windows and no subcostal window. Patient has extremely thin body habitus and very high window. IMPRESSIONS  1. Left ventricular ejection fraction, by estimation, is  45 to 50%. The left ventricle has mildly decreased function. The left ventricle demonstrates regional wall motion abnormalities with mid to apical inferoseptal hypokinesis, inferior hypokinesis, and inferolateral hypokinesis. There is mild left ventricular hypertrophy. Left ventricular diastolic parameters are consistent with Grade I diastolic dysfunction (impaired relaxation).  2. Right ventricular systolic function is mildly reduced. The right ventricular size is normal. Tricuspid regurgitation signal is inadequate for assessing PA pressure.  3. Left atrial size was mildly dilated.  4. Status post mitral valve repair. Mean gradient 3 mmHg, no signicant stenosis. No significant regurgitation noted.  5. The aortic valve is tricuspid. Aortic valve regurgitation is trivial. Mild aortic valve sclerosis is present, with no evidence of aortic valve stenosis.  6. Aortic dilatation noted. There is mild dilatation of the ascending aorta, measuring 41 mm.  7. Technically difficult study with poor acoustic windows. FINDINGS  Left Ventricle: Left ventricular ejection fraction, by estimation, is 45 to 50%. The left ventricle has mildly decreased function. The left ventricle demonstrates regional wall motion abnormalities. The left ventricular internal cavity size was normal in size. There is mild left ventricular hypertrophy. Left ventricular diastolic parameters are consistent with Grade I diastolic dysfunction (impaired relaxation). Right Ventricle: The right ventricular size is normal. No increase in right ventricular wall thickness. Right ventricular systolic function is mildly reduced. Tricuspid regurgitation signal is inadequate for assessing PA pressure. Left Atrium: Left atrial size was mildly dilated. Right Atrium: Right atrial size was normal in size. Pericardium: There is no evidence of pericardial effusion. Mitral Valve: Status post mitral valve repair. Mean gradient 3 mmHg, no signicant stenosis. No significant  regurgitation noted. The mitral valve has been repaired/replaced. No evidence of mitral valve regurgitation.  There is a prosthetic annuloplasty ring present in the mitral position. Procedure Date: 2005. MV peak gradient, 7.3 mmHg. The mean mitral valve gradient is 3.0 mmHg. Tricuspid Valve: The tricuspid valve is normal in structure. Tricuspid valve regurgitation is trivial. Aortic Valve: The aortic valve is tricuspid. Aortic valve regurgitation is trivial. Mild aortic valve sclerosis is present, with no evidence of aortic valve stenosis. Pulmonic Valve: The pulmonic valve was normal in structure. Pulmonic valve regurgitation is not visualized. Aorta: Aortic dilatation noted. There is mild dilatation of the ascending aorta, measuring 41 mm. Venous: The inferior vena cava was not well visualized. IAS/Shunts: No atrial level shunt detected by color flow Doppler.  LEFT VENTRICLE PLAX 2D LVIDd:         3.40 cm      Diastology LVIDs:         2.60 cm      LV e' medial:    6.85 cm/s LV PW:         1.25 cm      LV E/e' medial:  19.6 LV IVS:        1.40 cm      LV e' lateral:   8.16 cm/s LVOT diam:     2.00 cm      LV E/e' lateral: 16.4 LV SV:         45 LV SV Index:   30 LVOT Area:     3.14 cm  LV Volumes (MOD) LV vol d, MOD A2C: 115.0 ml LV vol d, MOD A4C: 66.2 ml LV vol s, MOD A2C: 58.0 ml LV vol s, MOD A4C: 37.5 ml LV SV MOD A2C:     57.0 ml LV SV MOD A4C:     66.2 ml LV SV MOD BP:      42.0 ml RIGHT VENTRICLE RV S prime:     9.14 cm/s TAPSE (M-mode): 1.0 cm LEFT ATRIUM             Index       RIGHT ATRIUM          Index LA diam:        3.50 cm 2.32 cm/m  RA Area:     9.97 cm LA Vol (A2C):   49.5 ml 32.86 ml/m RA Volume:   19.40 ml 12.88 ml/m LA Vol (A4C):   20.3 ml 13.48 ml/m LA Biplane Vol: 30.9 ml 20.51 ml/m  AORTIC VALVE LVOT Vmax:   105.00 cm/s LVOT Vmean:  65.500 cm/s LVOT VTI:    0.142 m  AORTA Ao Root diam: 3.95 cm Ao Asc diam:  4.10 cm MITRAL VALVE MV Area (PHT): 5.02 cm     SHUNTS MV Area VTI:   1.08  cm     Systemic VTI:  0.14 m MV Peak grad:  7.3 mmHg     Systemic Diam: 2.00 cm MV Mean grad:  3.0 mmHg MV Vmax:       1.35 m/s MV Vmean:      75.7 cm/s MV Decel Time: 151 msec MV E velocity: 134.00 cm/s MV A velocity: 130.00 cm/s MV E/A ratio:  1.03 Dalton McleanMD Electronically signed by Franki Monte Signature Date/Time: 09/17/2020/3:55:31 PM    Final     Labs:  CBC: Recent Labs    10/04/20 0438 10/05/20 0323 10/07/20 0344 10/08/20 0959  WBC 3.7* 4.5 6.8 5.3  HGB 12.4* 12.0* 12.0* 10.9*  HCT 37.7* 36.9* 37.3* 34.6*  PLT 225 177 PLATELET CLUMPS NOTED ON SMEAR, UNABLE TO  ESTIMATE 112*    COAGS: Recent Labs    10/06/20 0338 10/07/20 0344 10/08/20 0959 10/09/20 0404  INR 2.3* 2.7* 2.3* 2.1*    BMP: Recent Labs    10/05/20 0323 10/07/20 0344 10/08/20 0959 10/09/20 0404  NA 137 139 144 145  K 3.9 4.8 4.3 4.3  CL 87* 91* 96* 98  CO2 39* 38* 42* 36*  GLUCOSE 91 126* 110* 107*  BUN 42* 56* 37* 37*  CALCIUM 9.4 9.9 9.1 9.2  CREATININE 0.85 0.90 0.48* 0.40*  GFRNONAA >60 >60 >60 >60    LIVER FUNCTION TESTS: Recent Labs    09/16/20 1512 09/18/20 0235 09/20/20 0348 09/23/20 0259 09/24/20 0418 09/25/20 0241 09/30/20 0408 10/08/20 0959  BILITOT 1.4* 0.9 1.5*  --   --   --  0.8  --   AST 34 25 23  --   --   --  22  --   ALT 43 35 31  --   --   --  45*  --   ALKPHOS 46 37* 43  --   --   --  51  --   PROT 6.7 4.9* 5.6*  --   --   --  5.2*  --   ALBUMIN 4.4 3.2* 3.5   < > 3.2* 3.2* 3.0* 2.5*   < > = values in this interval not displayed.    TUMOR MARKERS: No results for input(s): AFPTM, CEA, CA199, CHROMGRNA in the last 8760 hours.  Assessment and Plan:  Malnutrition FTT Dysphagia Need for long term care Afib- on coumadin (LD 10/08/20)---- inr 2.1 today Scheduled for percutaneous gastric tube in IR asap--- need inr 1.5 Risks and benefits image guided gastrostomy tube placement was discussed with the patient including, but not limited to the need for a  barium enema during the procedure, bleeding, infection, peritonitis and/or damage to adjacent structures.  All of the patient's questions were answered, patient is agreeable to proceed.  Consent signed and in chart.   Thank you for this interesting consult.  I greatly enjoyed meeting Austin Davis and look forward to participating in their care.  A copy of this report was sent to the requesting provider on this date.  Electronically Signed: Lavonia Drafts, PA-C 10/09/2020, 11:19 AM   I spent a total of 40 Minutes    in face to face in clinical consultation, greater than 50% of which was counseling/coordinating care for percutaneous gastric tube placement

## 2020-10-10 ENCOUNTER — Other Ambulatory Visit (HOSPITAL_COMMUNITY): Payer: Medicare Other

## 2020-10-10 LAB — BLOOD GAS, ARTERIAL
Acid-Base Excess: 9.9 mmol/L — ABNORMAL HIGH (ref 0.0–2.0)
Acid-Base Excess: 12.2 mmol/L — ABNORMAL HIGH (ref 0.0–2.0)
Allens test (pass/fail): POSITIVE — AB
Bicarbonate: 36.6 mmol/L — ABNORMAL HIGH (ref 20.0–28.0)
Bicarbonate: 36.7 mmol/L — ABNORMAL HIGH (ref 20.0–28.0)
FIO2: 60
FIO2: 75
O2 Saturation: 81.1 %
O2 Saturation: 97.8 %
Patient temperature: 37
Patient temperature: 37
pCO2 arterial: 79.6 mmHg (ref 32.0–48.0)
pCO2 arterial: 50.4 mmHg — ABNORMAL HIGH (ref 32.0–48.0)
pH, Arterial: 7.284 — ABNORMAL LOW (ref 7.350–7.450)
pH, Arterial: 7.475 — ABNORMAL HIGH (ref 7.350–7.450)
pO2, Arterial: 48 mmHg — ABNORMAL LOW (ref 83.0–108.0)
pO2, Arterial: 85.3 mmHg (ref 83.0–108.0)

## 2020-10-10 LAB — CBC
HCT: 43.1 % (ref 39.0–52.0)
Hemoglobin: 12.7 g/dL — ABNORMAL LOW (ref 13.0–17.0)
MCH: 32.1 pg (ref 26.0–34.0)
MCHC: 29.5 g/dL — ABNORMAL LOW (ref 30.0–36.0)
MCV: 108.8 fL — ABNORMAL HIGH (ref 80.0–100.0)
Platelets: 198 10*3/uL (ref 150–400)
RBC: 3.96 MIL/uL — ABNORMAL LOW (ref 4.22–5.81)
RDW: 16.7 % — ABNORMAL HIGH (ref 11.5–15.5)
WBC: 10.6 10*3/uL — ABNORMAL HIGH (ref 4.0–10.5)
nRBC: 0 % (ref 0.0–0.2)

## 2020-10-10 LAB — PROTIME-INR
INR: 1.7 — ABNORMAL HIGH (ref 0.8–1.2)
Prothrombin Time: 20.2 seconds — ABNORMAL HIGH (ref 11.4–15.2)

## 2020-10-10 LAB — RENAL FUNCTION PANEL
Albumin: 2.7 g/dL — ABNORMAL LOW (ref 3.5–5.0)
Anion gap: 4 — ABNORMAL LOW (ref 5–15)
BUN: 40 mg/dL — ABNORMAL HIGH (ref 8–23)
CO2: 45 mmol/L — ABNORMAL HIGH (ref 22–32)
Calcium: 9.5 mg/dL (ref 8.9–10.3)
Chloride: 102 mmol/L (ref 98–111)
Creatinine, Ser: 0.75 mg/dL (ref 0.61–1.24)
GFR, Estimated: >60 mL/min (ref >60)
Glucose, Bld: 250 mg/dL — ABNORMAL HIGH (ref 70–99)
Phosphorus: 5.5 mg/dL — ABNORMAL HIGH (ref 2.5–4.6)
Potassium: 5.4 mmol/L — ABNORMAL HIGH (ref 3.5–5.1)
Sodium: 151 mmol/L — ABNORMAL HIGH (ref 135–145)

## 2020-10-10 IMAGING — DX DG CHEST 1V PORT
1 series · 1 of 1 positions shown · non-contrast
Comparison: Radiograph [DATE].  CT [DATE]

CLINICAL DATA: Respiratory distress.  Shortness of breath today.

EXAM:
PORTABLE CHEST 1 VIEW

[chest ap]
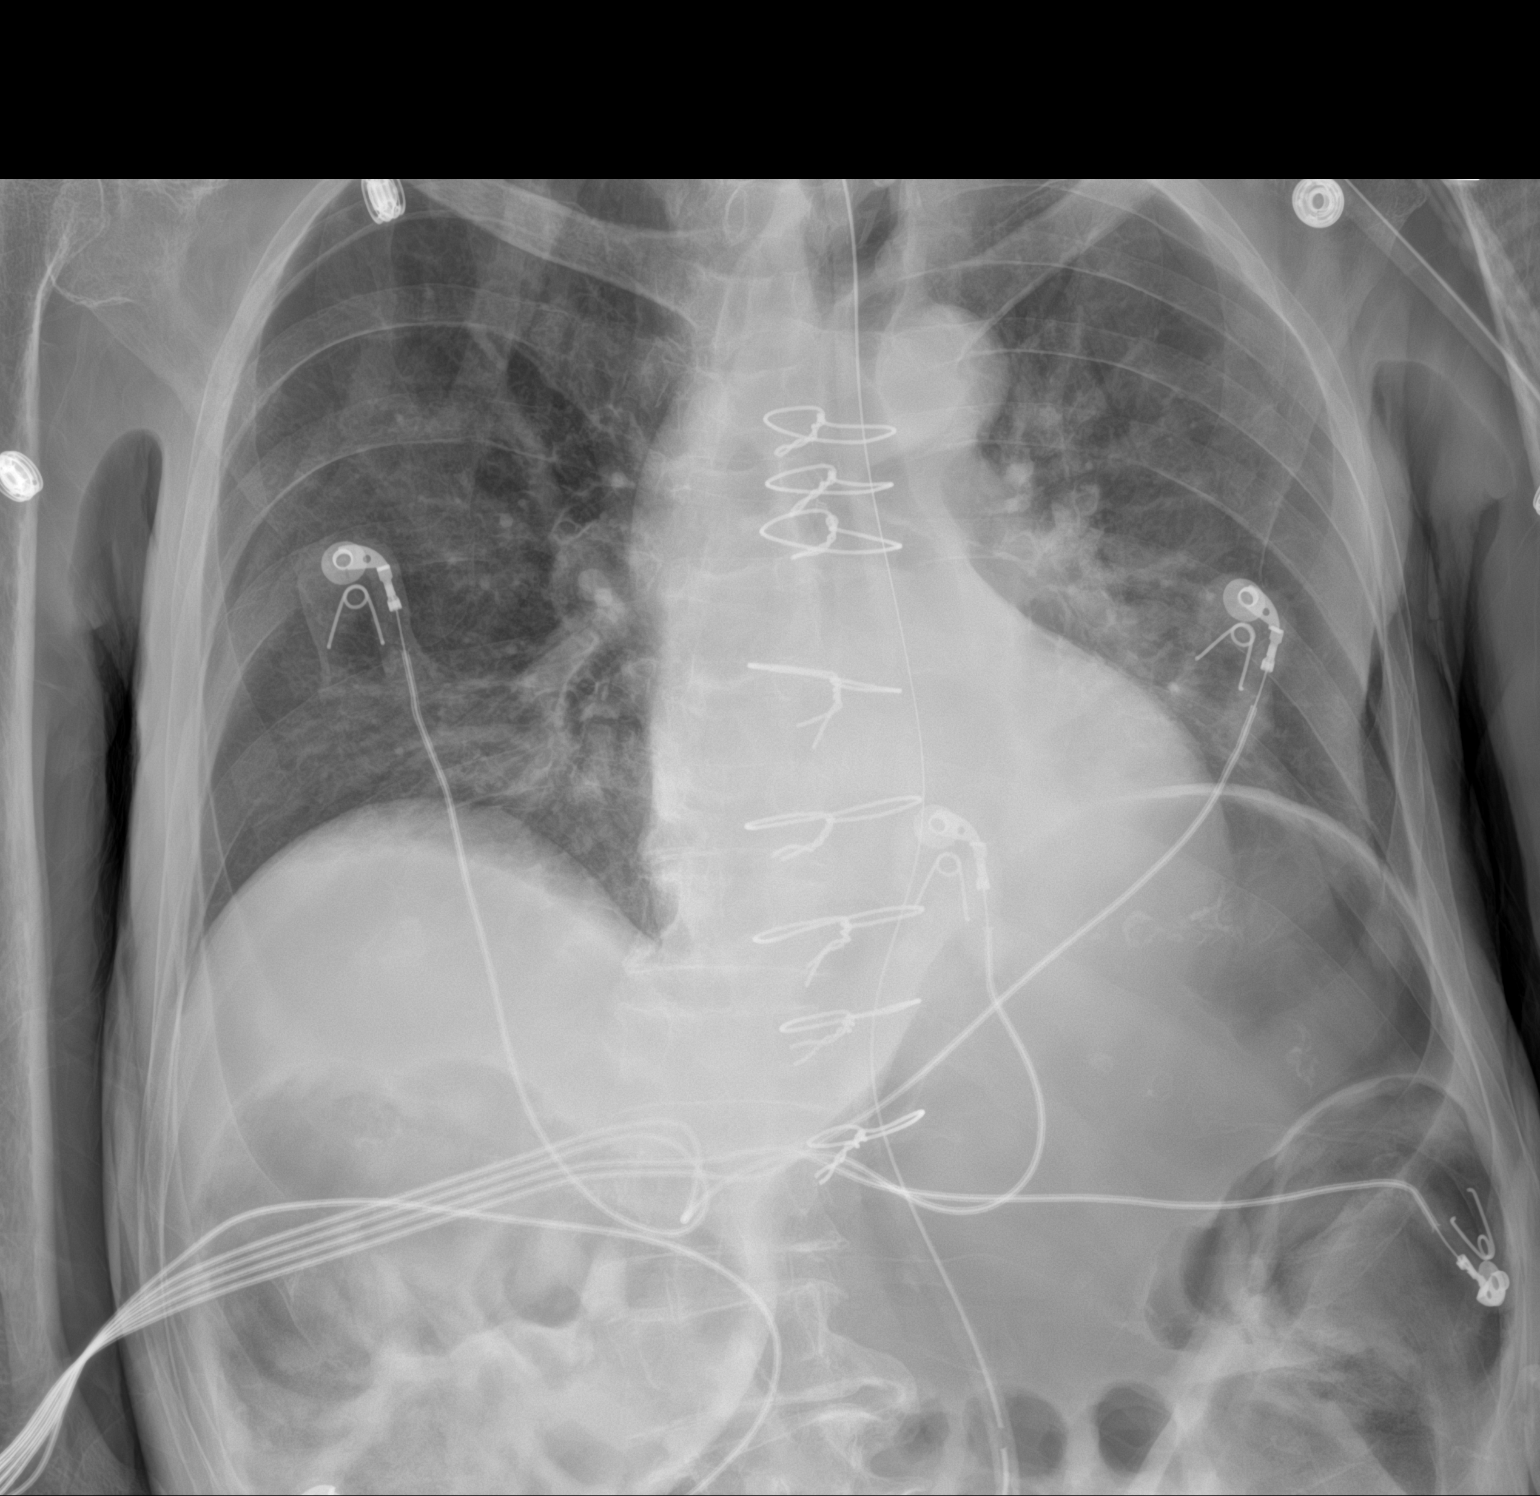

[1 of 1 positions shown; findings below may reference images not displayed]

FINDINGS: Patient is significantly rotated limiting assessment. Enteric tube
is in place with tip and side-port below the diaphragm. Post median
sternotomy. Heart is normal in size. Patchy right infrahilar
opacity, new. Unchanged retrocardiac left lung base opacity.
Multiple skin folds project over the left hemithorax. No pulmonary
edema. Small right pleural effusion. No definite left pleural
effusion. There is gaseous gastric distention in the upper abdomen.
Mild gaseous colonic distention is partially included.
IMPRESSION: 1. Patchy right infrahilar opacity may represent atelectasis or
pneumonia, new from prior exam. Stable left basilar consolidation.
2. Small right pleural effusion.
3. Gaseous gastric distention in the upper abdomen, despite enteric
tube in place.

## 2020-10-11 LAB — RENAL FUNCTION PANEL
Albumin: 2.2 g/dL — ABNORMAL LOW (ref 3.5–5.0)
Anion gap: 6 (ref 5–15)
BUN: 44 mg/dL — ABNORMAL HIGH (ref 8–23)
CO2: 41 mmol/L — ABNORMAL HIGH (ref 22–32)
Calcium: 8.9 mg/dL (ref 8.9–10.3)
Chloride: 102 mmol/L (ref 98–111)
Creatinine, Ser: 0.62 mg/dL (ref 0.61–1.24)
GFR, Estimated: >60 mL/min (ref >60)
Glucose, Bld: 143 mg/dL — ABNORMAL HIGH (ref 70–99)
Phosphorus: 1.5 mg/dL — ABNORMAL LOW (ref 2.5–4.6)
Potassium: 3.9 mmol/L (ref 3.5–5.1)
Sodium: 149 mmol/L — ABNORMAL HIGH (ref 135–145)

## 2020-10-11 LAB — BLOOD GAS, ARTERIAL
Acid-Base Excess: 13 mmol/L — ABNORMAL HIGH (ref 0.0–2.0)
Bicarbonate: 37.5 mmol/L — ABNORMAL HIGH (ref 20.0–28.0)
FIO2: 60
O2 Saturation: 93.8 %
Patient temperature: 36.5
pCO2 arterial: 50.5 mmHg — ABNORMAL HIGH (ref 32.0–48.0)
pH, Arterial: 7.481 — ABNORMAL HIGH (ref 7.350–7.450)
pO2, Arterial: 61.6 mmHg — ABNORMAL LOW (ref 83.0–108.0)

## 2020-10-11 LAB — CBC
HCT: 33.4 % — ABNORMAL LOW (ref 39.0–52.0)
Hemoglobin: 10.5 g/dL — ABNORMAL LOW (ref 13.0–17.0)
MCH: 32.7 pg (ref 26.0–34.0)
MCHC: 31.4 g/dL (ref 30.0–36.0)
MCV: 104 fL — ABNORMAL HIGH (ref 80.0–100.0)
Platelets: 163 10*3/uL (ref 150–400)
RBC: 3.21 MIL/uL — ABNORMAL LOW (ref 4.22–5.81)
RDW: 16.3 % — ABNORMAL HIGH (ref 11.5–15.5)
WBC: 12.6 10*3/uL — ABNORMAL HIGH (ref 4.0–10.5)
nRBC: 0 % (ref 0.0–0.2)

## 2020-10-11 LAB — PROTIME-INR
INR: 1.7 — ABNORMAL HIGH (ref 0.8–1.2)
Prothrombin Time: 20.2 seconds — ABNORMAL HIGH (ref 11.4–15.2)

## 2020-10-12 ENCOUNTER — Other Ambulatory Visit (HOSPITAL_COMMUNITY): Payer: Medicare Other

## 2020-10-12 DIAGNOSIS — I480 Paroxysmal atrial fibrillation: Secondary | ICD-10-CM

## 2020-10-12 DIAGNOSIS — G4733 Obstructive sleep apnea (adult) (pediatric): Secondary | ICD-10-CM

## 2020-10-12 DIAGNOSIS — J9622 Acute and chronic respiratory failure with hypercapnia: Secondary | ICD-10-CM

## 2020-10-12 DIAGNOSIS — J449 Chronic obstructive pulmonary disease, unspecified: Secondary | ICD-10-CM

## 2020-10-12 DIAGNOSIS — J9621 Acute and chronic respiratory failure with hypoxia: Secondary | ICD-10-CM

## 2020-10-12 DIAGNOSIS — I5032 Chronic diastolic (congestive) heart failure: Secondary | ICD-10-CM

## 2020-10-12 LAB — RENAL FUNCTION PANEL
Albumin: 2.4 g/dL — ABNORMAL LOW (ref 3.5–5.0)
Anion gap: 6 (ref 5–15)
BUN: 42 mg/dL — ABNORMAL HIGH (ref 8–23)
CO2: 37 mmol/L — ABNORMAL HIGH (ref 22–32)
Calcium: 8.3 mg/dL — ABNORMAL LOW (ref 8.9–10.3)
Chloride: 101 mmol/L (ref 98–111)
Creatinine, Ser: 0.47 mg/dL — ABNORMAL LOW (ref 0.61–1.24)
GFR, Estimated: >60 mL/min (ref >60)
Glucose, Bld: 173 mg/dL — ABNORMAL HIGH (ref 70–99)
Phosphorus: 3.4 mg/dL (ref 2.5–4.6)
Potassium: 3.4 mmol/L — ABNORMAL LOW (ref 3.5–5.1)
Sodium: 144 mmol/L (ref 135–145)

## 2020-10-12 LAB — CBC
HCT: 37.5 % — ABNORMAL LOW (ref 39.0–52.0)
Hemoglobin: 11.9 g/dL — ABNORMAL LOW (ref 13.0–17.0)
MCH: 32.7 pg (ref 26.0–34.0)
MCHC: 31.7 g/dL (ref 30.0–36.0)
MCV: 103 fL — ABNORMAL HIGH (ref 80.0–100.0)
Platelets: 171 10*3/uL (ref 150–400)
RBC: 3.64 MIL/uL — ABNORMAL LOW (ref 4.22–5.81)
RDW: 16.6 % — ABNORMAL HIGH (ref 11.5–15.5)
WBC: 12.7 10*3/uL — ABNORMAL HIGH (ref 4.0–10.5)
nRBC: 0 % (ref 0.0–0.2)

## 2020-10-12 LAB — MAGNESIUM: Magnesium: 2.2 mg/dL (ref 1.7–2.4)

## 2020-10-12 LAB — BLOOD GAS, ARTERIAL
Acid-Base Excess: 7.6 mmol/L — ABNORMAL HIGH (ref 0.0–2.0)
Bicarbonate: 34.5 mmol/L — ABNORMAL HIGH (ref 20.0–28.0)
FIO2: 70
O2 Saturation: 95.9 %
Patient temperature: 35.5
pCO2 arterial: 74.9 mmHg (ref 32.0–48.0)
pH, Arterial: 7.276 — ABNORMAL LOW (ref 7.350–7.450)
pO2, Arterial: 76.3 mmHg — ABNORMAL LOW (ref 83.0–108.0)

## 2020-10-12 LAB — PROTIME-INR
INR: 1.4 — ABNORMAL HIGH (ref 0.8–1.2)
Prothrombin Time: 17.1 seconds — ABNORMAL HIGH (ref 11.4–15.2)

## 2020-10-12 IMAGING — DX DG ABD PORTABLE 1V
1 series · 1 of 1 positions shown · non-contrast
Comparison: [DATE].

CLINICAL DATA: Ileus.

EXAM:
PORTABLE ABDOMEN - 1 VIEW

[abdomen]
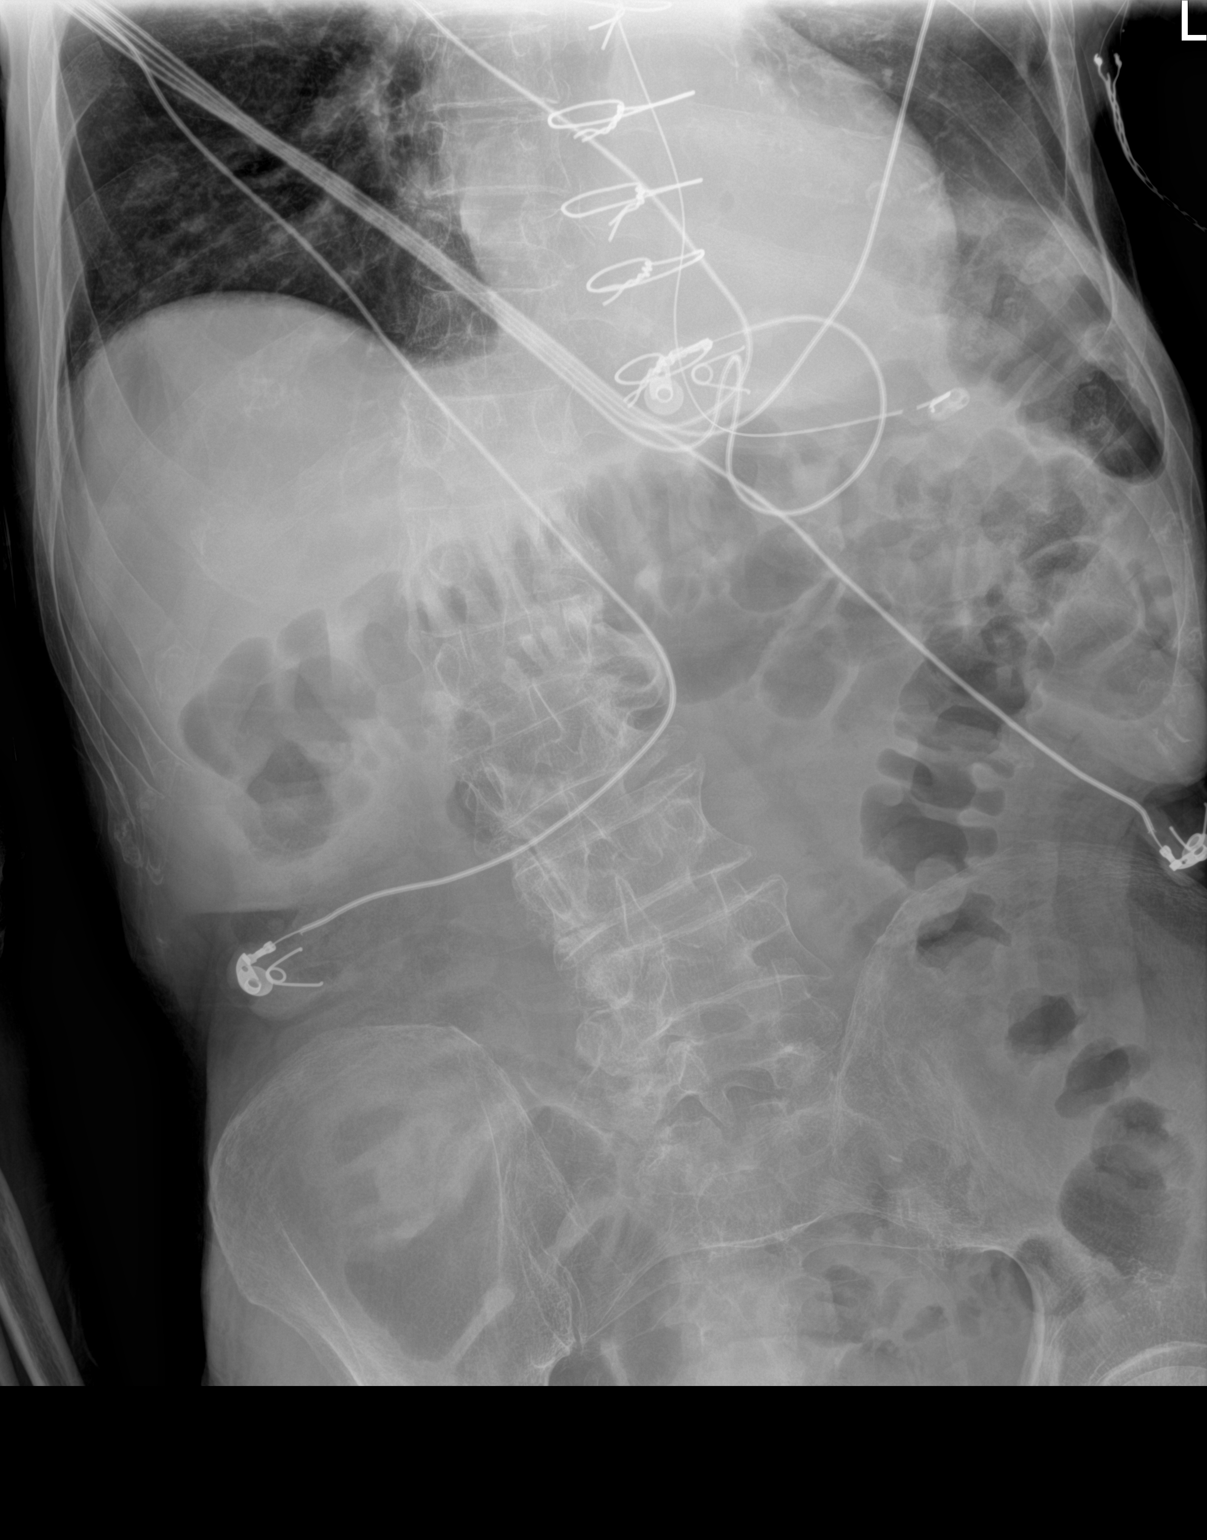

[1 of 1 positions shown; findings below may reference images not displayed]

FINDINGS: The bowel gas pattern is normal. No radio-opaque calculi or other
significant radiographic abnormality are seen. Nasogastric tube tip
is seen in proximal stomach.
IMPRESSION: No abnormal bowel dilatation. Nasogastric tube tip seen in proximal
stomach.

## 2020-10-12 IMAGING — DX DG CHEST 1V PORT
1 series · 1 of 1 positions shown · non-contrast
Comparison: Radiograph [DATE]

CLINICAL DATA: Intubation.

EXAM:
PORTABLE CHEST 1 VIEW

[chest ap]
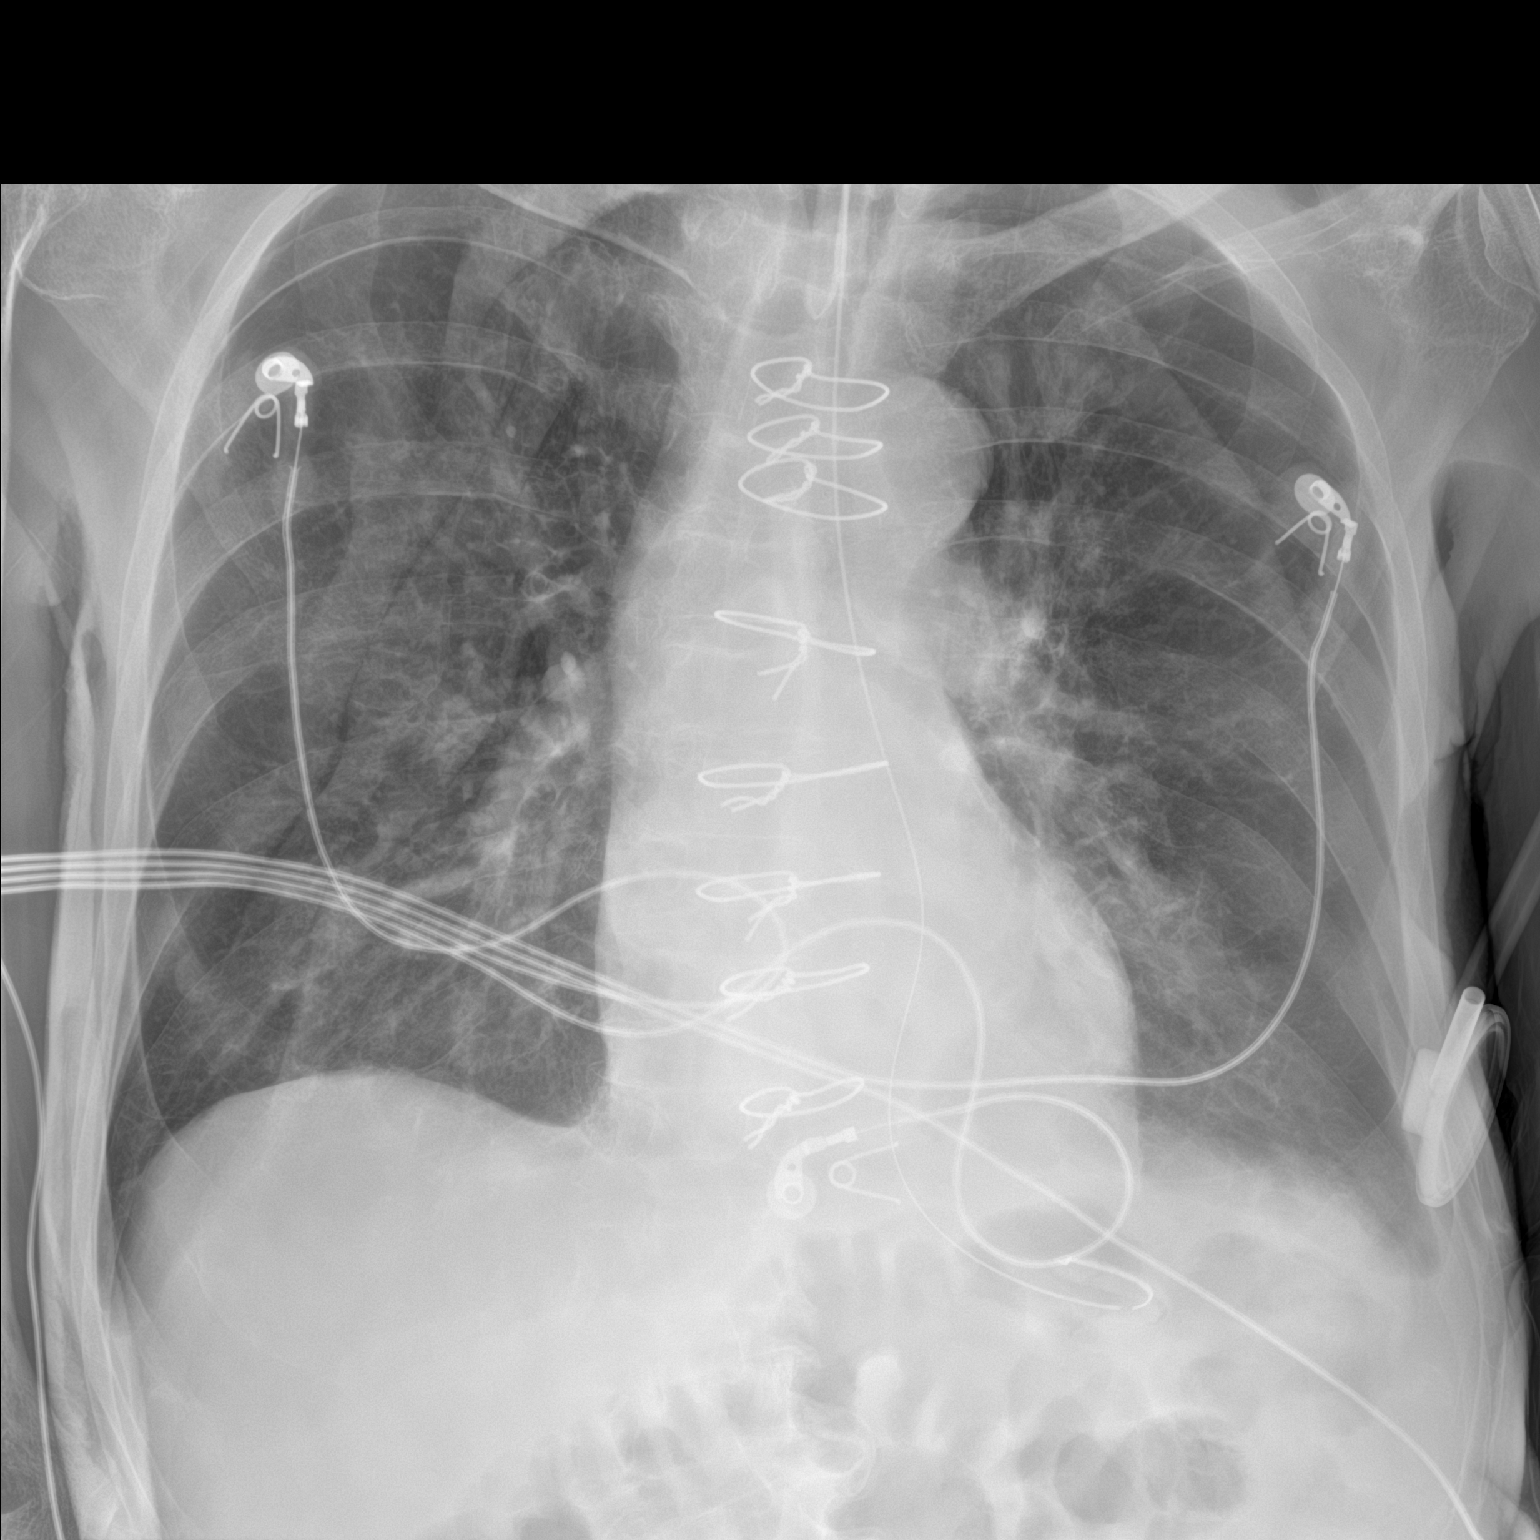

[1 of 1 positions shown; findings below may reference images not displayed]

FINDINGS: Endotracheal tube tip is at the level of the clavicular heads.
Enteric tube tip and side-port below the diaphragm in the stomach.
Improved lung volumes from prior exam. Post median sternotomy. The
heart is normal in size. Multiple skin folds project over both hemi
thoraces. Improving retrocardiac opacity, streaky right lung base
opacity is unchanged. Small bilateral pleural effusions, increased
on the left. No pneumothorax.
IMPRESSION: 1. Endotracheal tube tip at the level of the clavicular heads.
Enteric tube tip and side-port below the diaphragm in the stomach.
2. Improving retrocardiac opacity. Stable streaky right lung base
opacity.
3. Small bilateral pleural effusions, increased on the left.

## 2020-10-12 NOTE — Consult Note (Signed)
[facility] [place]  Date of Service: 10/12/2020  PULMONARY CRITICAL CARE CONSULT   Austin Davis  NLZ:[id number]  DOB: [birth year]   DOA: 09/29/2020  Referring Physician: Satira Sark, MD  HPI: Austin Davis is a 80 y.o. male seen for follow up of Acute on Chronic Respiratory Failure.  Patient has multiple medical problems including atrial fibrillation obstructive sleep apnea malnutrition COPD came into the hospital because of increasing shortness of breath.  Patient had been recently placed on CPAP for the sleep apnea.  He also is having some issues with stomach pain which is why they came in to the ER.  In the ER was found to be in respiratory failure placed on BiPAP and he had several rapid responses apparently and subsequently was transferred to our facility for further management and weaning.  In addition has a history of heart failure but it does not appear that he is decompensated.  Review of Systems:  ROS performed and is unremarkable other than noted above.  Past Medical History:  Diagnosis Date   CHF (congestive heart failure) ([place])    Coronary artery disease    GI bleed 2019   hospitalized at [facility] for one week   Headache    since childhood   Low blood sugar    since childhood, controlled by diet   Mitral regurgitation    Mitral valve prolapse    Osteopenia 2021   Paroxysmal atrial fibrillation [facility])     Past Surgical History:  Procedure Laterality Date   COLONOSCOPY WITH PROPOFOL N/A 02/19/2017   Procedure: COLONOSCOPY WITH PROPOFOL;  Surgeon: Wilford Corner, MD;  Location: [facility] ENDOSCOPY;  Service: Endoscopy;  Laterality: N/A;   laser eye surgery for retina detachment     MITRAL VALVE REPAIR  01/2003   monitor  02/05/2006   polyp removal     TONSILLECTOMY     tooth removal     as a teenager    Social History:    reports that he has never smoked. He has never used smokeless  tobacco. He reports current alcohol use. He reports that he does not use drugs.  Family History: Non-Contributory to the present illness  Allergies  Allergen Reactions   Prednisone Other (See Comments)    Makes skin crawl, rapid HR   Cortisone Palpitations    Medications: Reviewed on Rounds  Physical Exam:  Vitals: Temperature is 97.5 pulse 67 respiratory rate is 18 blood pressure is 109/51 saturations 100%  Ventilator Settings patient currently is on BiPAP this morning  General: Comfortable at this time Eyes: Grossly normal lids, irises & conjunctiva ENT: grossly tongue is normal Neck: no obvious mass Cardiovascular: S1-S2 normal no gallop or rub Respiratory: Coarse rhonchi are noted bilaterally Abdomen: Soft and nontender Skin: no rash seen on limited exam Musculoskeletal: not rigid Psychiatric:unable to assess Neurologic: no seizure no involuntary movements         Labs on Admission:  Basic Metabolic Panel: Recent Labs  Lab 10/07/20 0344 10/08/20 0959 10/09/20 0404 10/10/20 1710 10/11/20 0453  NA 139 144 145 151* 149*  K 4.8 4.3 4.3 5.4* 3.9  CL 91* 96* 98 102 102  CO2 38* 42* 36* 45* 41*  GLUCOSE 126* 110* 107* 250* 143*  BUN 56* 37* 37* 40* 44*  CREATININE 0.90 0.48* 0.40* 0.75 0.62  CALCIUM 9.9 9.1 9.2 9.5 8.9  MG 3.0* 2.4  --   --   --  PHOS 2.8 1.5*  --  5.5* 1.5*    Recent Labs  Lab 10/10/20 1718 10/10/20 2127 10/11/20 1705  PHART 7.284* 7.475* 7.481*  PCO2ART 79.6* 50.4* 50.5*  PO2ART 48.0* 85.3 61.6*  HCO3 36.6* 36.7* 37.5*  O2SAT 81.1 97.8 93.8    Liver Function Tests: Recent Labs  Lab 10/08/20 0959 10/10/20 1710 10/11/20 0453  ALBUMIN 2.5* 2.7* 2.2*   No results for input(s): LIPASE, AMYLASE in the last 168 hours. No results for input(s): AMMONIA in the last 168 hours.  CBC: Recent Labs  Lab 10/07/20 0344 10/08/20 0959 10/10/20 1710 10/11/20 0453  WBC 6.8 5.3 10.6* 12.6*  HGB 12.0* 10.9* 12.7* 10.5*  HCT 37.3* 34.6*  43.1 33.4*  MCV 101.9* 102.7* 108.8* 104.0*  PLT PLATELET CLUMPS NOTED ON SMEAR, UNABLE TO ESTIMATE 112* 198 163    Cardiac Enzymes: No results for input(s): CKTOTAL, CKMB, CKMBINDEX, TROPONINI in the last 168 hours.  BNP (last 3 results) Recent Labs    09/19/20 1606  BNP 876.9*    ProBNP (last 3 results) No results for input(s): PROBNP in the last 8760 hours.   Radiological Exams on Admission: DG Chest Port 1 View  Result Date: 10/10/2020 CLINICAL DATA:  Respiratory distress.  Shortness of breath today. EXAM: PORTABLE CHEST 1 VIEW COMPARISON:  Radiograph 10/04/2020.  CT 09/18/2020 FINDINGS: Patient is significantly rotated limiting assessment. Enteric tube is in place with tip and side-port below the diaphragm. Post median sternotomy. Heart is normal in size. Patchy right infrahilar opacity, new. Unchanged retrocardiac left lung base opacity. Multiple skin folds project over the left hemithorax. No pulmonary edema. Small right pleural effusion. No definite left pleural effusion. There is gaseous gastric distention in the upper abdomen. Mild gaseous colonic distention is partially included. IMPRESSION: 1. Patchy right infrahilar opacity may represent atelectasis or pneumonia, new from prior exam. Stable left basilar consolidation. 2. Small right pleural effusion. 3. Gaseous gastric distention in the upper abdomen, despite enteric tube in place. Electronically Signed   By: Keith Rake M.D.   On: 10/10/2020 19:29    Assessment/Plan Active Problems:   Acute on chronic respiratory failure with hypoxia and hypercapnia (HCC)   AF (paroxysmal atrial fibrillation) (HCC)   Obstructive sleep apnea   COPD, severe (HCC)   Chronic heart failure with preserved ejection fraction (HCC)   Acute on chronic respiratory failure hypoxia patient is on continuous BiPAP last ABG looked good spoke to the patient as well as respiratory therapy during rounds and we are going to place the patient on high  flow oxygen and then used BiPAP at nighttime for nocturnal support. Severe COPD patient will be given nebulizers as needed.  We will continue to monitor oxygenation.  Concern is that he has been on continuous BiPAP as noted above discussed with the patient regarding overall prognosis. Obstructive sleep apnea right now is on BiPAP we will continue to monitor along closely. Chronic heart failure unspecified we will monitor his fluid status right now appears to be compensated Persistent atrial fibrillation right now rate is controlled we will continue with supportive care medical management  I have personally seen and evaluated the patient, evaluated laboratory and imaging results, formulated the assessment and plan and placed orders. The Patient requires high complexity decision making with multiple systems involvement.  Case was discussed on Rounds with the Respiratory Therapy Director and the Respiratory staff Time Spent 38minutes  Laird Runnion A Shaden Higley, MD [facility] Pulmonary Critical Care Medicine Sleep Medicine

## 2020-10-13 LAB — URINALYSIS, ROUTINE W REFLEX MICROSCOPIC
Bilirubin Urine: NEGATIVE
Glucose, UA: NEGATIVE mg/dL
Ketones, ur: NEGATIVE mg/dL
Nitrite: NEGATIVE
Protein, ur: NEGATIVE mg/dL
RBC / HPF: >50 RBC/hpf — ABNORMAL HIGH (ref 0–5)
Specific Gravity, Urine: 1.02 (ref 1.005–1.030)
pH: 5 (ref 5.0–8.0)

## 2020-10-13 LAB — BLOOD GAS, ARTERIAL
Acid-Base Excess: 11 mmol/L — ABNORMAL HIGH (ref 0.0–2.0)
Allens test (pass/fail): POSITIVE — AB
Bicarbonate: 35.3 mmol/L — ABNORMAL HIGH (ref 20.0–28.0)
FIO2: 70
O2 Saturation: 98.7 %
Patient temperature: 33
pCO2 arterial: 40.4 mmHg (ref 32.0–48.0)
pH, Arterial: 7.531 — ABNORMAL HIGH (ref 7.350–7.450)
pO2, Arterial: 92.5 mmHg (ref 83.0–108.0)

## 2020-10-14 LAB — BASIC METABOLIC PANEL
Anion gap: 3 — ABNORMAL LOW (ref 5–15)
BUN: 36 mg/dL — ABNORMAL HIGH (ref 8–23)
CO2: 39 mmol/L — ABNORMAL HIGH (ref 22–32)
Calcium: 8.3 mg/dL — ABNORMAL LOW (ref 8.9–10.3)
Chloride: 102 mmol/L (ref 98–111)
Creatinine, Ser: 0.43 mg/dL — ABNORMAL LOW (ref 0.61–1.24)
GFR, Estimated: >60 mL/min (ref >60)
Glucose, Bld: 128 mg/dL — ABNORMAL HIGH (ref 70–99)
Potassium: 3.6 mmol/L (ref 3.5–5.1)
Sodium: 144 mmol/L (ref 135–145)

## 2020-10-14 LAB — BLOOD GAS, ARTERIAL
Acid-Base Excess: 12.5 mmol/L — ABNORMAL HIGH (ref 0.0–2.0)
Acid-Base Excess: 13.8 mmol/L — ABNORMAL HIGH (ref 0.0–2.0)
Bicarbonate: 38.6 mmol/L — ABNORMAL HIGH (ref 20.0–28.0)
Bicarbonate: 38 mmol/L — ABNORMAL HIGH (ref 20.0–28.0)
FIO2: 60
FIO2: 45
O2 Saturation: 99 %
O2 Saturation: 98.4 %
Patient temperature: 36.5
Patient temperature: 36.6
pCO2 arterial: 71.5 mmHg (ref 32.0–48.0)
pCO2 arterial: 47.1 mmHg (ref 32.0–48.0)
pH, Arterial: 7.349 — ABNORMAL LOW (ref 7.350–7.450)
pH, Arterial: 7.515 — ABNORMAL HIGH (ref 7.350–7.450)
pO2, Arterial: 142 mmHg — ABNORMAL HIGH (ref 83.0–108.0)
pO2, Arterial: 100 mmHg (ref 83.0–108.0)

## 2020-10-14 LAB — CBC
HCT: 31.5 % — ABNORMAL LOW (ref 39.0–52.0)
Hemoglobin: 10 g/dL — ABNORMAL LOW (ref 13.0–17.0)
MCH: 32.2 pg (ref 26.0–34.0)
MCHC: 31.7 g/dL (ref 30.0–36.0)
MCV: 101.3 fL — ABNORMAL HIGH (ref 80.0–100.0)
Platelets: 164 10*3/uL (ref 150–400)
RBC: 3.11 MIL/uL — ABNORMAL LOW (ref 4.22–5.81)
RDW: 16.2 % — ABNORMAL HIGH (ref 11.5–15.5)
WBC: 12.8 10*3/uL — ABNORMAL HIGH (ref 4.0–10.5)
nRBC: 0 % (ref 0.0–0.2)

## 2020-10-14 LAB — URINE CULTURE

## 2020-10-14 LAB — MAGNESIUM: Magnesium: 2.1 mg/dL (ref 1.7–2.4)

## 2020-10-14 NOTE — Progress Notes (Signed)
Pulmonary Critical Care Medicine [place]  PROGRESS NOTE     Austin Davis  ZYS:[id number]  DOB: Jun 05, 1940   DOA: 09/29/2020  Referring Physician: Satira Sark, MD  HPI: Austin Davis is a 80 y.o. male being followed for ventilator/airway/oxygen weaning Acute on Chronic Respiratory Failure.  Patient was intubated I had a very lengthy discussion with the patient's daughter gave her an update explained to her the reasons for the intubation.  In addition we had a conversation regarding his overall medical status he is quite weak and frail and will likely end up benefiting from an early trach rather than waiting.  The daughter states that she wants everything and anything possible  Medications: Reviewed on Rounds  Physical Exam:  Vitals: Temperature is 98.2 pulse 84 respiratory rate is 27 blood pressure is 120/66 saturations 99%  Ventilator Settings on assist control FiO2 60% tidal volume 485 PEEP 5  General: Comfortable at this time Neck: supple Cardiovascular: no malignant arrhythmias Respiratory: Coarse breath sounds with few scattered rhonchi Skin: no rash seen on limited exam Musculoskeletal: No gross abnormality Psychiatric:unable to assess Neurologic:no involuntary movements         Lab Data:   Basic Metabolic Panel: Recent Labs  Lab 10/08/20 0959 10/09/20 0404 10/10/20 1710 10/11/20 0453 10/12/20 1309 10/12/20 1321 10/14/20 0408  NA 144 145 151* 149*  --  144 144  K 4.3 4.3 5.4* 3.9  --  3.4* 3.6  CL 96* 98 102 102  --  101 102  CO2 42* 36* 45* 41*  --  37* 39*  GLUCOSE 110* 107* 250* 143*  --  173* 128*  BUN 37* 37* 40* 44*  --  42* 36*  CREATININE 0.48* 0.40* 0.75 0.62  --  0.47* 0.43*  CALCIUM 9.1 9.2 9.5 8.9  --  8.3* 8.3*  MG 2.4  --   --   --  2.2  --  2.1  PHOS 1.5*  --  5.5* 1.5*  --  3.4  --     ABG: Recent Labs  Lab 10/10/20 2127 10/11/20 1705 10/12/20 2336 10/13/20 0545  10/14/20 0855  PHART 7.475* 7.481* 7.276* 7.531* 7.349*  PCO2ART 50.4* 50.5* 74.9* 40.4 71.5*  PO2ART 85.3 61.6* 76.3* 92.5 142*  HCO3 36.7* 37.5* 34.5* 35.3* 38.6*  O2SAT 97.8 93.8 95.9 98.7 99.0    Liver Function Tests: Recent Labs  Lab 10/08/20 0959 10/10/20 1710 10/11/20 0453 10/12/20 1321  ALBUMIN 2.5* 2.7* 2.2* 2.4*   No results for input(s): LIPASE, AMYLASE in the last 168 hours. No results for input(s): AMMONIA in the last 168 hours.  CBC: Recent Labs  Lab 10/08/20 0959 10/10/20 1710 10/11/20 0453 10/12/20 1309 10/14/20 0408  WBC 5.3 10.6* 12.6* 12.7* 12.8*  HGB 10.9* 12.7* 10.5* 11.9* 10.0*  HCT 34.6* 43.1 33.4* 37.5* 31.5*  MCV 102.7* 108.8* 104.0* 103.0* 101.3*  PLT 112* 198 163 171 164    Cardiac Enzymes: No results for input(s): CKTOTAL, CKMB, CKMBINDEX, TROPONINI in the last 168 hours.  BNP (last 3 results) Recent Labs    09/19/20 1606  BNP 876.9*    ProBNP (last 3 results) No results for input(s): PROBNP in the last 8760 hours.  Radiological Exams: DG CHEST PORT 1 VIEW  Result Date: 10/13/2020 CLINICAL DATA:  Intubation. EXAM: PORTABLE CHEST 1 VIEW COMPARISON:  Radiograph 10/10/2020 FINDINGS: Endotracheal tube tip is at the level of the clavicular heads. Enteric tube tip and side-port below  the diaphragm in the stomach. Improved lung volumes from prior exam. Post median sternotomy. The heart is normal in size. Multiple skin folds project over both hemi thoraces. Improving retrocardiac opacity, streaky right lung base opacity is unchanged. Small bilateral pleural effusions, increased on the left. No pneumothorax. IMPRESSION: 1. Endotracheal tube tip at the level of the clavicular heads. Enteric tube tip and side-port below the diaphragm in the stomach. 2. Improving retrocardiac opacity. Stable streaky right lung base opacity. 3. Small bilateral pleural effusions, increased on the left. Electronically Signed   By: Keith Rake M.D.   On: 10/13/2020  00:00   DG Abd Portable 1V  Result Date: 10/12/2020 CLINICAL DATA:  Ileus. EXAM: PORTABLE ABDOMEN - 1 VIEW COMPARISON:  October 04, 2020. FINDINGS: The bowel gas pattern is normal. No radio-opaque calculi or other significant radiographic abnormality are seen. Nasogastric tube tip is seen in proximal stomach. IMPRESSION: No abnormal bowel dilatation. Nasogastric tube tip seen in proximal stomach. Electronically Signed   By: Marijo Conception M.D.   On: 10/12/2020 13:40    Assessment/Plan Active Problems:   Acute on chronic respiratory failure with hypoxia and hypercapnia (HCC)   AF (paroxysmal atrial fibrillation) (HCC)   Obstructive sleep apnea   COPD, severe (HCC)   Chronic heart failure with preserved ejection fraction (HCC)   Acute on chronic respiratory failure with hypoxia we will continue with full support on the ventilator right now on assist control mode continue pulmonary toilet and secretion management.  Patient's mechanics are still poor follow-up ABG was done pH 7.34 PCO2 71 PO2 142 I asked respiratory therapy to increase respiratory rate based on the current blood gas. Atrial fibrillation rate now rate is controlled plan is currently to continue with monitoring and medical management Obstructive sleep apnea nonissue right now patient's been on PAP therapy Severe COPD medical management Chronic heart failure preserved ejection fraction at baseline right now   I have personally seen and evaluated the patient, evaluated laboratory and imaging results, formulated the assessment and plan and placed orders. The Patient requires high complexity decision making with multiple systems involvement.  Rounds were done with the Respiratory Therapy Director and Staff therapists and discussed with nursing staff also.  Allyne Gee, MD [facility] Pulmonary Critical Care Medicine Sleep Medicine

## 2020-10-15 ENCOUNTER — Other Ambulatory Visit (HOSPITAL_COMMUNITY): Payer: Medicare Other

## 2020-10-15 LAB — CULTURE, RESPIRATORY W GRAM STAIN: Culture: NORMAL

## 2020-10-15 LAB — PROTIME-INR
INR: 1.2 (ref 0.8–1.2)
Prothrombin Time: 15.4 seconds — ABNORMAL HIGH (ref 11.4–15.2)

## 2020-10-15 IMAGING — DX DG CHEST 1V PORT
1 series · 1 of 1 positions shown · non-contrast
Comparison: Portable chest [DATE] and earlier.

CLINICAL DATA: 79-year-old male with respiratory failure,
intubated.

EXAM:
PORTABLE CHEST 1 VIEW

[chest]
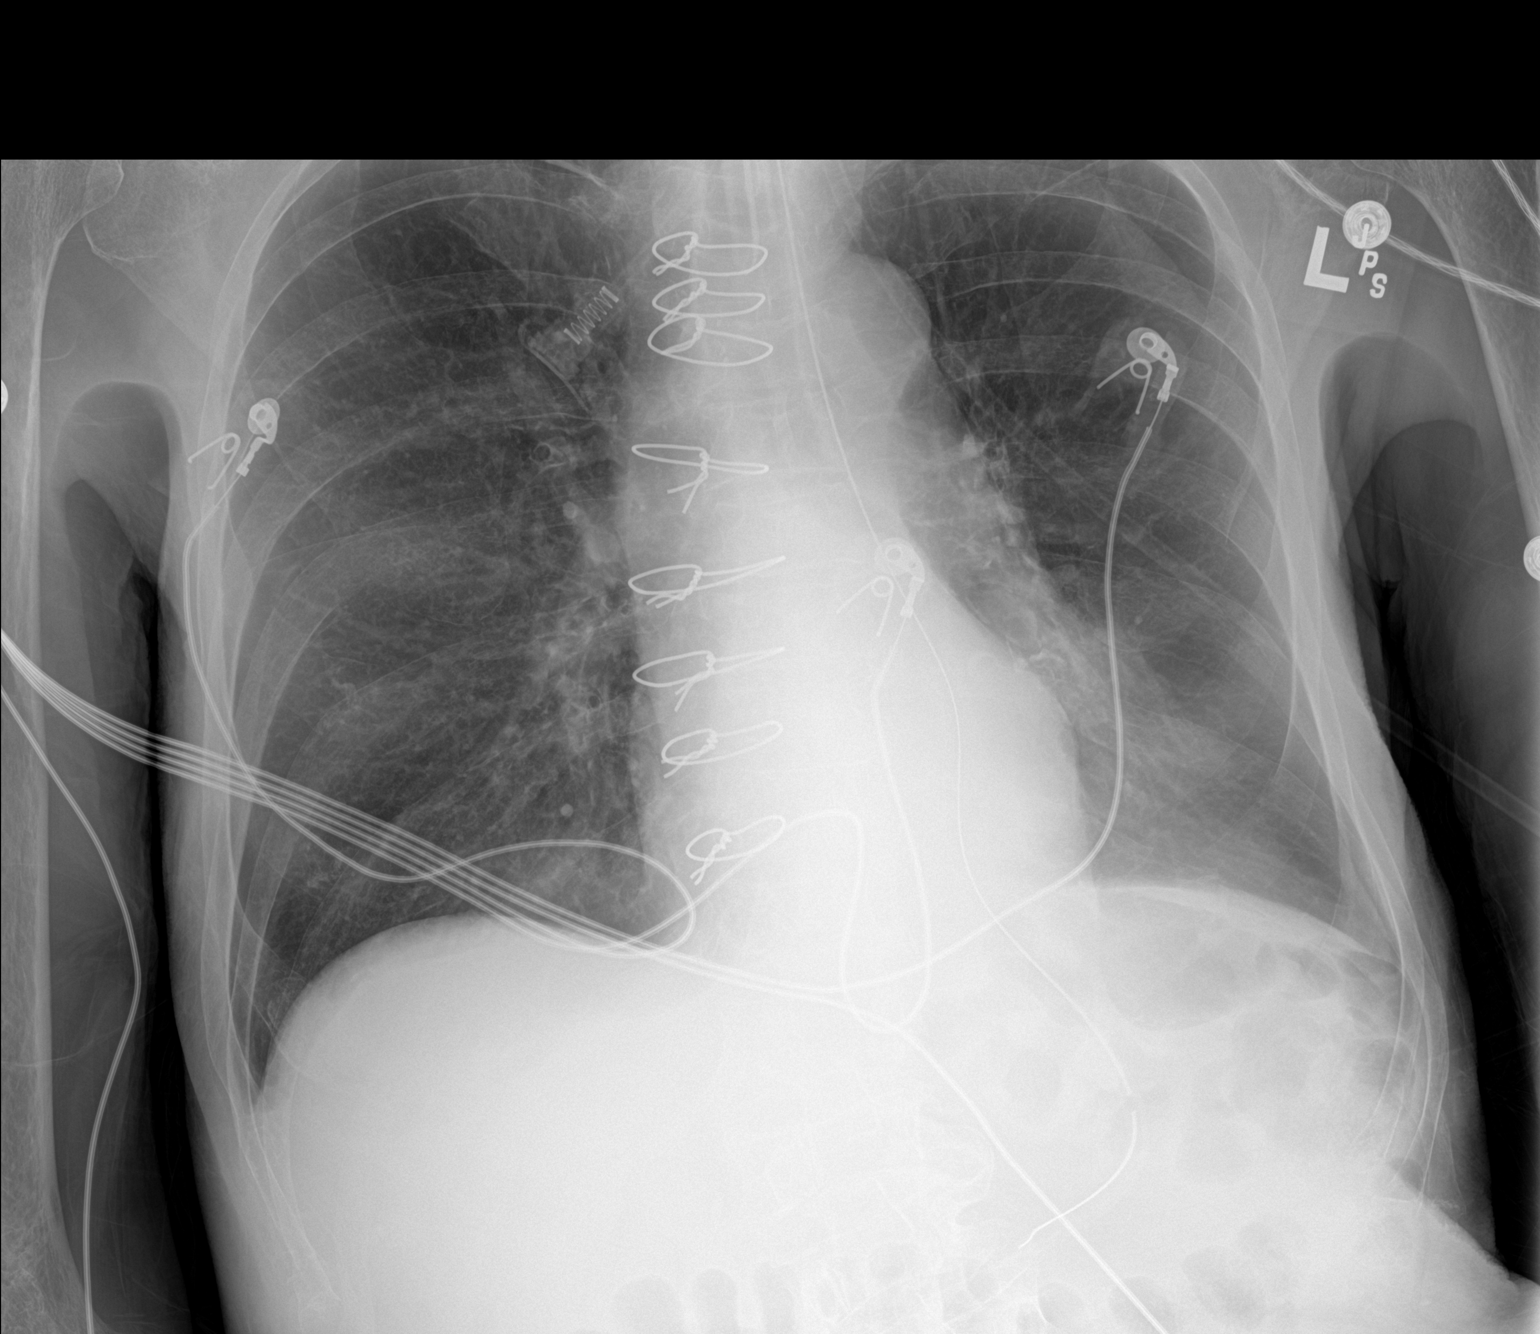

[1 of 1 positions shown; findings below may reference images not displayed]

FINDINGS: Portable AP semi upright view at [TO] hours. Stable lines and tubes.
Mediastinal contours are stable and within normal limits. Stable
lung volumes with a mild degree of pulmonary hyperinflation. Skin
fold artifact over the left upper lung. No pneumothorax, pulmonary
edema. Small bilateral pleural effusions seen by CT [DATE]
persist. Confluent left lower lobe, retrocardiac opacity seen at
that time persists. No other confluent opacity. Negative visible
bowel gas. Stable visualized osseous structures. Thoracolumbar
junction region compression fractures.
IMPRESSION: 1.  Stable lines and tubes.
2. Left lower lobe (retrocardiac) pneumonia and small pleural
effusions not significantly changed since the CT on [DATE].

## 2020-10-15 IMAGING — DX DG CHEST 1V PORT
1 series · 1 of 1 positions shown · non-contrast
Comparison: [DATE] and CT chest [DATE].

CLINICAL DATA: Pneumonia, ventilator dependent.

EXAM:
PORTABLE CHEST 1 VIEW

[chest ap]
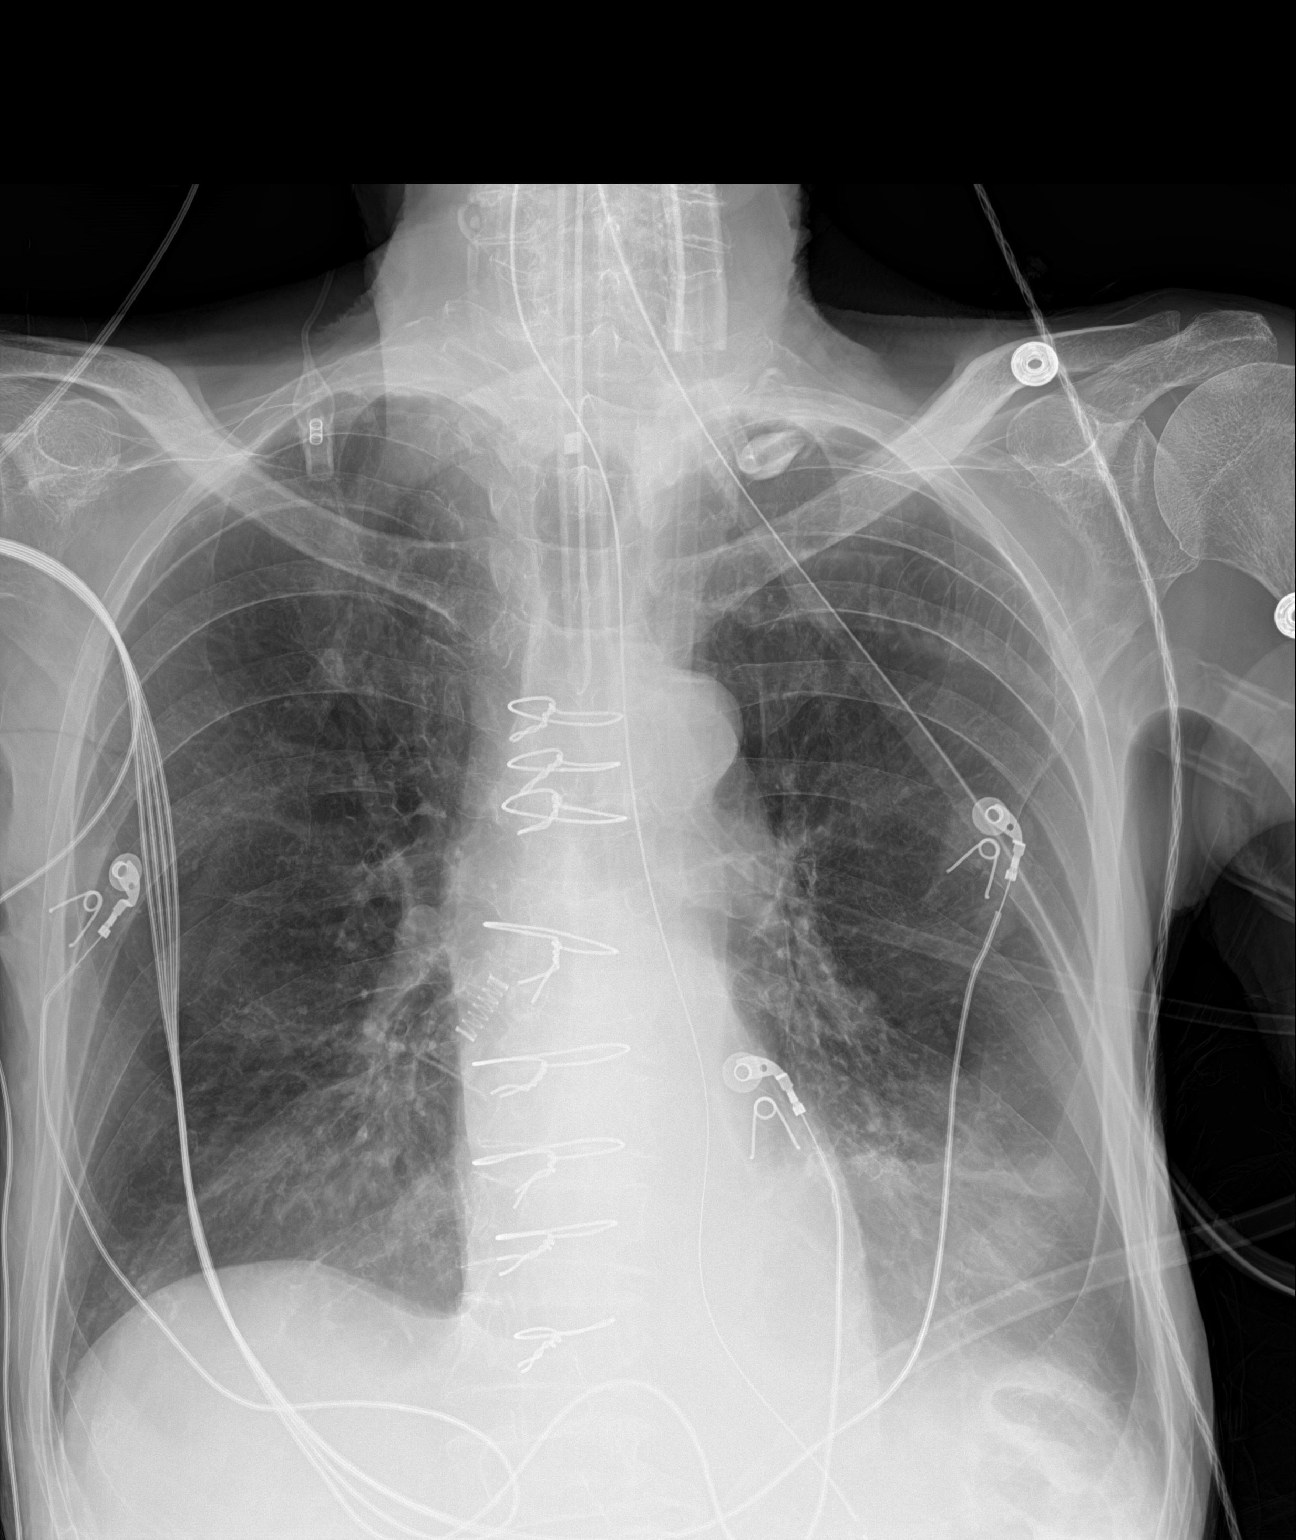

[1 of 1 positions shown; findings below may reference images not displayed]

FINDINGS: Endotracheal tube terminates 3.9 cm above the carina. Nasogastric
tube is followed into the stomach with the tip projecting beyond the
inferior margin of the image. Heart size normal. Similar bibasilar
airspace opacification. No definite pleural fluid. Biapical pleural
thickening.
IMPRESSION: Similar bibasilar airspace opacification, likely due to pneumonia.

## 2020-10-15 NOTE — Progress Notes (Signed)
Pulmonary Critical Care Medicine [place]  PROGRESS NOTE     Austin Davis  UXN:[id number]  DOB: 1940/11/29   DOA: 09/29/2020  Referring Physician: Satira Sark, MD  HPI: Austin Davis is a 80 y.o. male being followed for ventilator/airway/oxygen weaning Acute on Chronic Respiratory Failure.  Patient is on the ventilator and full support has been tolerating pressure support fairly well respiratory therapy is going to continue to try to wean on pressure support but has indicated previously I think patient would be better off with tracheostomy due to overall deconditioning and weakness  Medications: Reviewed on Rounds  Physical Exam:  Vitals: Temperature 97.7 pulse 78 respiratory is 15 blood pressure is 135/57 saturations 100%  Ventilator Settings assist-control FiO2 45% tidal volume 420 PEEP 5  General: Comfortable at this time Neck: supple Cardiovascular: no malignant arrhythmias Respiratory: No rhonchi very coarse breath sounds Skin: no rash seen on limited exam Musculoskeletal: No gross abnormality Psychiatric:unable to assess Neurologic:no involuntary movements         Lab Data:   Basic Metabolic Panel: Recent Labs  Lab 10/08/20 0959 10/09/20 0404 10/10/20 1710 10/11/20 0453 10/12/20 1309 10/12/20 1321 10/14/20 0408  NA 144 145 151* 149*  --  144 144  K 4.3 4.3 5.4* 3.9  --  3.4* 3.6  CL 96* 98 102 102  --  101 102  CO2 42* 36* 45* 41*  --  37* 39*  GLUCOSE 110* 107* 250* 143*  --  173* 128*  BUN 37* 37* 40* 44*  --  42* 36*  CREATININE 0.48* 0.40* 0.75 0.62  --  0.47* 0.43*  CALCIUM 9.1 9.2 9.5 8.9  --  8.3* 8.3*  MG 2.4  --   --   --  2.2  --  2.1  PHOS 1.5*  --  5.5* 1.5*  --  3.4  --     ABG: Recent Labs  Lab 10/11/20 1705 10/12/20 2336 10/13/20 0545 10/14/20 0855 10/14/20 1730  PHART 7.481* 7.276* 7.531* 7.349* 7.515*  PCO2ART 50.5* 74.9* 40.4 71.5* 47.1  PO2ART 61.6* 76.3*  92.5 142* 100  HCO3 37.5* 34.5* 35.3* 38.6* 38.0*  O2SAT 93.8 95.9 98.7 99.0 98.4    Liver Function Tests: Recent Labs  Lab 10/08/20 0959 10/10/20 1710 10/11/20 0453 10/12/20 1321  ALBUMIN 2.5* 2.7* 2.2* 2.4*   No results for input(s): LIPASE, AMYLASE in the last 168 hours. No results for input(s): AMMONIA in the last 168 hours.  CBC: Recent Labs  Lab 10/08/20 0959 10/10/20 1710 10/11/20 0453 10/12/20 1309 10/14/20 0408  WBC 5.3 10.6* 12.6* 12.7* 12.8*  HGB 10.9* 12.7* 10.5* 11.9* 10.0*  HCT 34.6* 43.1 33.4* 37.5* 31.5*  MCV 102.7* 108.8* 104.0* 103.0* 101.3*  PLT 112* 198 163 171 164    Cardiac Enzymes: No results for input(s): CKTOTAL, CKMB, CKMBINDEX, TROPONINI in the last 168 hours.  BNP (last 3 results) Recent Labs    09/19/20 1606  BNP 876.9*    ProBNP (last 3 results) No results for input(s): PROBNP in the last 8760 hours.  Radiological Exams: DG Chest Port 1 View  Result Date: 10/15/2020 CLINICAL DATA:  80 year old male with respiratory failure, intubated. EXAM: PORTABLE CHEST 1 VIEW COMPARISON:  Portable chest 10/12/2020 and earlier. FINDINGS: Portable AP semi upright view at 0543 hours. Stable lines and tubes. Mediastinal contours are stable and within normal limits. Stable lung volumes with a mild degree of pulmonary hyperinflation. Skin fold artifact over  the left upper lung. No pneumothorax, pulmonary edema. Small bilateral pleural effusions seen by CT 917 22 May persist. Confluent left lower lobe, retrocardiac opacity seen at that time persists. No other confluent opacity. Negative visible bowel gas. Stable visualized osseous structures. Thoracolumbar junction region compression fractures. IMPRESSION: 1.  Stable lines and tubes. 2. Left lower lobe (retrocardiac) pneumonia and small pleural effusions not significantly changed since the CT on 10/06/2020. Electronically Signed   By: Genevie Ann M.D.   On: 10/15/2020 06:03    Assessment/Plan Active Problems:    Acute on chronic respiratory failure with hypoxia and hypercapnia (HCC)   AF (paroxysmal atrial fibrillation) (HCC)   Obstructive sleep apnea   COPD, severe (HCC)   Chronic heart failure with preserved ejection fraction (HCC)   Acute on chronic respiratory failure hypoxia we will proceed with a pressure support wean and will get ENT to take a look at him for consideration of tracheostomy for anticipated prolonged mechanical ventilation due to overall deconditioning and weakness.  In addition there is pneumonia noted on the last film Atrial fibrillation rate controlled monitor on telemetry Obstructive sleep apnea currently not an issue we will continue with supportive care Severe COPD nebulizers as needed Chronic heart failure preserved ejection fraction patient is at baseline   I have personally seen and evaluated the patient, evaluated laboratory and imaging results, formulated the assessment and plan and placed orders. The Patient requires high complexity decision making with multiple systems involvement.  Rounds were done with the Respiratory Therapy Director and Staff therapists and discussed with nursing staff also.  Allyne Gee, MD [facility] Pulmonary Critical Care Medicine Sleep Medicine

## 2020-10-16 LAB — RENAL FUNCTION PANEL
Albumin: 1.9 g/dL — ABNORMAL LOW (ref 3.5–5.0)
Anion gap: 7 (ref 5–15)
BUN: 31 mg/dL — ABNORMAL HIGH (ref 8–23)
CO2: 39 mmol/L — ABNORMAL HIGH (ref 22–32)
Calcium: 8.1 mg/dL — ABNORMAL LOW (ref 8.9–10.3)
Chloride: 95 mmol/L — ABNORMAL LOW (ref 98–111)
Creatinine, Ser: 0.42 mg/dL — ABNORMAL LOW (ref 0.61–1.24)
GFR, Estimated: >60 mL/min (ref >60)
Glucose, Bld: 116 mg/dL — ABNORMAL HIGH (ref 70–99)
Phosphorus: 1 mg/dL — CL (ref 2.5–4.6)
Potassium: 3.7 mmol/L (ref 3.5–5.1)
Sodium: 141 mmol/L (ref 135–145)

## 2020-10-16 LAB — CBC
HCT: 31.5 % — ABNORMAL LOW (ref 39.0–52.0)
Hemoglobin: 10 g/dL — ABNORMAL LOW (ref 13.0–17.0)
MCH: 32.3 pg (ref 26.0–34.0)
MCHC: 31.7 g/dL (ref 30.0–36.0)
MCV: 101.6 fL — ABNORMAL HIGH (ref 80.0–100.0)
Platelets: 195 10*3/uL (ref 150–400)
RBC: 3.1 MIL/uL — ABNORMAL LOW (ref 4.22–5.81)
RDW: 16.3 % — ABNORMAL HIGH (ref 11.5–15.5)
WBC: 14.2 10*3/uL — ABNORMAL HIGH (ref 4.0–10.5)
nRBC: 0 % (ref 0.0–0.2)

## 2020-10-16 LAB — MAGNESIUM: Magnesium: 2.2 mg/dL (ref 1.7–2.4)

## 2020-10-16 NOTE — Progress Notes (Signed)
Pulmonary Critical Care Medicine [place]  PROGRESS NOTE     Austin Davis  IEP:[id number]  DOB: [birth year]   DOA: 09/29/2020  Referring Physician: Satira Sark, MD  HPI: ALIZE ACY is a 80 y.o. male being followed for ventilator/airway/oxygen weaning Acute on Chronic Respiratory Failure.  Had a very lengthy conversation with the patient's daughter over the telephone this morning.  She had questions regarding plan of care which were answered also had questions regarding possibility of tracheostomy which were answered.  I explained to her again that because the patient's overall nutritional status and medical condition is poor that risk of doing the trach in him is high however extubation will also present with the possibility of failure and then having to reintubate him.  I think the proactive thing to do would be to consider doing an early tracheostomy if he continues to not successfully weaned.  She understands the risks and the benefits and she also did make it clear that they do want everything done and patient is to remain a full code.  She is going to discuss the possibility of tracheostomy with family members and then get back to me  Medications: Reviewed on Rounds  Physical Exam:  Vitals: Temperature is 97.7 pulse 94 respiratory 23 blood pressure is 138/74 saturations 94%  Ventilator Settings currently on assist control FiO2 45% tidal volume 450 PEEP 5  General: Comfortable at this time Neck: supple Cardiovascular: no malignant arrhythmias Respiratory: Coarse rhonchi are noted bilaterally Skin: no rash seen on limited exam Musculoskeletal: No gross abnormality Psychiatric:unable to assess Neurologic:no involuntary movements         Lab Data:   Basic Metabolic Panel: Recent Labs  Lab 10/10/20 1710 10/11/20 0453 10/12/20 1309 10/12/20 1321 10/14/20 0408 10/16/20 0632  NA 151* 149*  --  144 144 141   K 5.4* 3.9  --  3.4* 3.6 3.7  CL 102 102  --  101 102 95*  CO2 45* 41*  --  37* 39* 39*  GLUCOSE 250* 143*  --  173* 128* 116*  BUN 40* 44*  --  42* 36* 31*  CREATININE 0.75 0.62  --  0.47* 0.43* 0.42*  CALCIUM 9.5 8.9  --  8.3* 8.3* 8.1*  MG  --   --  2.2  --  2.1 2.2  PHOS 5.5* 1.5*  --  3.4  --  1.0*    ABG: Recent Labs  Lab 10/11/20 1705 10/12/20 2336 10/13/20 0545 10/14/20 0855 10/14/20 1730  PHART 7.481* 7.276* 7.531* 7.349* 7.515*  PCO2ART 50.5* 74.9* 40.4 71.5* 47.1  PO2ART 61.6* 76.3* 92.5 142* 100  HCO3 37.5* 34.5* 35.3* 38.6* 38.0*  O2SAT 93.8 95.9 98.7 99.0 98.4    Liver Function Tests: Recent Labs  Lab 10/10/20 1710 10/11/20 0453 10/12/20 1321 10/16/20 0632  ALBUMIN 2.7* 2.2* 2.4* 1.9*   No results for input(s): LIPASE, AMYLASE in the last 168 hours. No results for input(s): AMMONIA in the last 168 hours.  CBC: Recent Labs  Lab 10/10/20 1710 10/11/20 0453 10/12/20 1309 10/14/20 0408 10/16/20 0632  WBC 10.6* 12.6* 12.7* 12.8* 14.2*  HGB 12.7* 10.5* 11.9* 10.0* 10.0*  HCT 43.1 33.4* 37.5* 31.5* 31.5*  MCV 108.8* 104.0* 103.0* 101.3* 101.6*  PLT 198 163 171 164 195    Cardiac Enzymes: No results for input(s): CKTOTAL, CKMB, CKMBINDEX, TROPONINI in the last 168 hours.  BNP (last 3 results) Recent Labs    09/19/20  1606  BNP 876.9*    ProBNP (last 3 results) No results for input(s): PROBNP in the last 8760 hours.  Radiological Exams: DG Chest Port 1 View  Result Date: 10/15/2020 CLINICAL DATA:  Pneumonia, ventilator dependent. EXAM: PORTABLE CHEST 1 VIEW COMPARISON:  10/15/2020 and CT chest 09/18/2020. FINDINGS: Endotracheal tube terminates 3.9 cm above the carina. Nasogastric tube is followed into the stomach with the tip projecting beyond the inferior margin of the image. Heart size normal. Similar bibasilar airspace opacification. No definite pleural fluid. Biapical pleural thickening. IMPRESSION: Similar bibasilar airspace  opacification, likely due to pneumonia. Electronically Signed   By: Lorin Picket M.D.   On: 10/15/2020 13:00   DG Chest Port 1 View  Result Date: 10/15/2020 CLINICAL DATA:  80 year old male with respiratory failure, intubated. EXAM: PORTABLE CHEST 1 VIEW COMPARISON:  Portable chest 10/12/2020 and earlier. FINDINGS: Portable AP semi upright view at 0543 hours. Stable lines and tubes. Mediastinal contours are stable and within normal limits. Stable lung volumes with a mild degree of pulmonary hyperinflation. Skin fold artifact over the left upper lung. No pneumothorax, pulmonary edema. Small bilateral pleural effusions seen by CT 917 22 May persist. Confluent left lower lobe, retrocardiac opacity seen at that time persists. No other confluent opacity. Negative visible bowel gas. Stable visualized osseous structures. Thoracolumbar junction region compression fractures. IMPRESSION: 1.  Stable lines and tubes. 2. Left lower lobe (retrocardiac) pneumonia and small pleural effusions not significantly changed since the CT on 10/06/2020. Electronically Signed   By: Genevie Ann M.D.   On: 10/15/2020 06:03    Assessment/Plan Active Problems:   Acute on chronic respiratory failure with hypoxia and hypercapnia (HCC)   AF (paroxysmal atrial fibrillation) (HCC)   Obstructive sleep apnea   COPD, severe (HCC)   Chronic heart failure with preserved ejection fraction (HCC)   Acute on chronic respiratory failure hypoxia at this time we will continue with full support on the ventilator.  Patient is not tolerating weaning consistently.  Family is going to discuss treatment options and also I have given them information regarding possibility of tracheostomy.  The patient's prognosis remains guarded Obstructive sleep apnea nonissue patient is on the ventilator at this time. Severe COPD nebulizers as needed.  We will continue with medical management Atrial fibrillation rate now rate is controlled we will continue with  supportive care Chronic heart failure diastolic dysfunction supportive care monitor fluid status   I have personally seen and evaluated the patient, evaluated laboratory and imaging results, formulated the assessment and plan and placed orders. The Patient requires high complexity decision making with multiple systems involvement.  Rounds were done with the Respiratory Therapy Director and Staff therapists and discussed with nursing staff also.  Allyne Gee, MD [facility] Pulmonary Critical Care Medicine Sleep Medicine

## 2020-10-17 LAB — CBC
HCT: 30.8 % — ABNORMAL LOW (ref 39.0–52.0)
Hemoglobin: 10 g/dL — ABNORMAL LOW (ref 13.0–17.0)
MCH: 32.9 pg (ref 26.0–34.0)
MCHC: 32.5 g/dL (ref 30.0–36.0)
MCV: 101.3 fL — ABNORMAL HIGH (ref 80.0–100.0)
Platelets: 228 10*3/uL (ref 150–400)
RBC: 3.04 MIL/uL — ABNORMAL LOW (ref 4.22–5.81)
RDW: 16.6 % — ABNORMAL HIGH (ref 11.5–15.5)
WBC: 12.9 10*3/uL — ABNORMAL HIGH (ref 4.0–10.5)
nRBC: 0 % (ref 0.0–0.2)

## 2020-10-17 LAB — URINE CULTURE: Culture: 50000 — AB

## 2020-10-17 LAB — MAGNESIUM: Magnesium: 2.4 mg/dL (ref 1.7–2.4)

## 2020-10-17 LAB — PHOSPHORUS: Phosphorus: 3.3 mg/dL (ref 2.5–4.6)

## 2020-10-17 LAB — BASIC METABOLIC PANEL
Anion gap: 3 — ABNORMAL LOW (ref 5–15)
BUN: 29 mg/dL — ABNORMAL HIGH (ref 8–23)
CO2: 41 mmol/L — ABNORMAL HIGH (ref 22–32)
Calcium: 7.7 mg/dL — ABNORMAL LOW (ref 8.9–10.3)
Chloride: 95 mmol/L — ABNORMAL LOW (ref 98–111)
Creatinine, Ser: 0.4 mg/dL — ABNORMAL LOW (ref 0.61–1.24)
GFR, Estimated: >60 mL/min (ref >60)
Glucose, Bld: 102 mg/dL — ABNORMAL HIGH (ref 70–99)
Potassium: 4.2 mmol/L (ref 3.5–5.1)
Sodium: 139 mmol/L (ref 135–145)

## 2020-10-17 LAB — PROTIME-INR
INR: 1 (ref 0.8–1.2)
Prothrombin Time: 13.6 seconds (ref 11.4–15.2)

## 2020-10-17 NOTE — Progress Notes (Signed)
Pulmonary Critical Care Medicine [place]  PROGRESS NOTE     Austin Davis  WYO:[id number]  DOB: Jul 05, 1940   DOA: 09/29/2020  Referring Physician: Satira Sark, MD  HPI: Austin Davis is a 80 y.o. male being followed for ventilator/airway/oxygen weaning Acute on Chronic Respiratory Failure.  Patient is currently on full support on pressure control mode has been on 35% FiO2  Medications: Reviewed on Rounds  Physical Exam:  Vitals: Temperature is 98.0 pulse 103 respiratory 26 blood pressure is 128/53 saturations 100%  Ventilator Settings was attempted on 24-hour pressure support goal he failed to reach the 24-hour mark  General: Comfortable at this time Neck: supple Cardiovascular: no malignant arrhythmias Respiratory: No rhonchi very coarse breath sounds Skin: no rash seen on limited exam Musculoskeletal: No gross abnormality Psychiatric:unable to assess Neurologic:no involuntary movements         Lab Data:   Basic Metabolic Panel: Recent Labs  Lab 10/10/20 1710 10/11/20 0453 10/12/20 1309 10/12/20 1321 10/14/20 0408 10/16/20 0632 10/17/20 0329  NA 151* 149*  --  144 144 141 139  K 5.4* 3.9  --  3.4* 3.6 3.7 4.2  CL 102 102  --  101 102 95* 95*  CO2 45* 41*  --  37* 39* 39* 41*  GLUCOSE 250* 143*  --  173* 128* 116* 102*  BUN 40* 44*  --  42* 36* 31* 29*  CREATININE 0.75 0.62  --  0.47* 0.43* 0.42* 0.40*  CALCIUM 9.5 8.9  --  8.3* 8.3* 8.1* 7.7*  MG  --   --  2.2  --  2.1 2.2 2.4  PHOS 5.5* 1.5*  --  3.4  --  1.0* 3.3    ABG: Recent Labs  Lab 10/11/20 1705 10/12/20 2336 10/13/20 0545 10/14/20 0855 10/14/20 1730  PHART 7.481* 7.276* 7.531* 7.349* 7.515*  PCO2ART 50.5* 74.9* 40.4 71.5* 47.1  PO2ART 61.6* 76.3* 92.5 142* 100  HCO3 37.5* 34.5* 35.3* 38.6* 38.0*  O2SAT 93.8 95.9 98.7 99.0 98.4    Liver Function Tests: Recent Labs  Lab 10/10/20 1710 10/11/20 0453 10/12/20 1321  10/16/20 0632  ALBUMIN 2.7* 2.2* 2.4* 1.9*   No results for input(s): LIPASE, AMYLASE in the last 168 hours. No results for input(s): AMMONIA in the last 168 hours.  CBC: Recent Labs  Lab 10/11/20 0453 10/12/20 1309 10/14/20 0408 10/16/20 0632 10/17/20 0329  WBC 12.6* 12.7* 12.8* 14.2* 12.9*  HGB 10.5* 11.9* 10.0* 10.0* 10.0*  HCT 33.4* 37.5* 31.5* 31.5* 30.8*  MCV 104.0* 103.0* 101.3* 101.6* 101.3*  PLT 163 171 164 195 228    Cardiac Enzymes: No results for input(s): CKTOTAL, CKMB, CKMBINDEX, TROPONINI in the last 168 hours.  BNP (last 3 results) Recent Labs    09/19/20 1606  BNP 876.9*    ProBNP (last 3 results) No results for input(s): PROBNP in the last 8760 hours.  Radiological Exams: DG Chest Port 1 View  Result Date: 10/15/2020 CLINICAL DATA:  Pneumonia, ventilator dependent. EXAM: PORTABLE CHEST 1 VIEW COMPARISON:  10/15/2020 and CT chest 09/18/2020. FINDINGS: Endotracheal tube terminates 3.9 cm above the carina. Nasogastric tube is followed into the stomach with the tip projecting beyond the inferior margin of the image. Heart size normal. Similar bibasilar airspace opacification. No definite pleural fluid. Biapical pleural thickening. IMPRESSION: Similar bibasilar airspace opacification, likely due to pneumonia. Electronically Signed   By: Lorin Picket M.D.   On: 10/15/2020 13:00    Assessment/Plan  Active Problems:   Acute on chronic respiratory failure with hypoxia and hypercapnia (HCC)   AF (paroxysmal atrial fibrillation) (HCC)   Obstructive sleep apnea   COPD, severe (HCC)   Chronic heart failure with preserved ejection fraction (HCC)   Acute on chronic respiratory failure with hypoxia patient will continue with the attempted weaning on pressure support however ambulating get ENT consultation for evaluation of tracheostomy as has been discussed with the daughter at length in the last few meetings with her. Obstructive sleep apnea nonissue patient is  on mechanical support Atrial fibrillation right now rate is controlled we will continue to follow along Chronic heart failure diastolic dysfunction appears to be compensated Severe COPD nebulizers medical management   I have personally seen and evaluated the patient, evaluated laboratory and imaging results, formulated the assessment and plan and placed orders. The Patient requires high complexity decision making with multiple systems involvement.  Rounds were done with the Respiratory Therapy Director and Staff therapists and discussed with nursing staff also.  Allyne Gee, MD [facility] Pulmonary Critical Care Medicine Sleep Medicine

## 2020-10-17 NOTE — Progress Notes (Signed)
When transporter arrived to bring pt to IR, there was not a portable monitor available to bring pt down. Will attempt to send for pt again if IR schedule allows.

## 2020-10-17 NOTE — Progress Notes (Signed)
Phone call to pt RN to inquire about pt coming to IR for procedure. Pt is able to travel down at 1200pm per RN and RT.

## 2020-10-18 ENCOUNTER — Other Ambulatory Visit (HOSPITAL_COMMUNITY): Payer: Medicare Other

## 2020-10-18 DIAGNOSIS — J9620 Acute and chronic respiratory failure, unspecified whether with hypoxia or hypercapnia: Secondary | ICD-10-CM

## 2020-10-18 HISTORY — PX: IR GASTROSTOMY TUBE MOD SED: IMG625

## 2020-10-18 LAB — CBC
HCT: 28.8 % — ABNORMAL LOW (ref 39.0–52.0)
Hemoglobin: 9.4 g/dL — ABNORMAL LOW (ref 13.0–17.0)
MCH: 32.9 pg (ref 26.0–34.0)
MCHC: 32.6 g/dL (ref 30.0–36.0)
MCV: 100.7 fL — ABNORMAL HIGH (ref 80.0–100.0)
Platelets: 237 10*3/uL (ref 150–400)
RBC: 2.86 MIL/uL — ABNORMAL LOW (ref 4.22–5.81)
RDW: 16.3 % — ABNORMAL HIGH (ref 11.5–15.5)
WBC: 8.9 10*3/uL (ref 4.0–10.5)
nRBC: 0 % (ref 0.0–0.2)

## 2020-10-18 LAB — CULTURE, RESPIRATORY W GRAM STAIN: Culture: NORMAL

## 2020-10-18 LAB — BASIC METABOLIC PANEL
Anion gap: 6 (ref 5–15)
BUN: 25 mg/dL — ABNORMAL HIGH (ref 8–23)
CO2: 33 mmol/L — ABNORMAL HIGH (ref 22–32)
Calcium: 7.9 mg/dL — ABNORMAL LOW (ref 8.9–10.3)
Chloride: 98 mmol/L (ref 98–111)
Creatinine, Ser: 0.37 mg/dL — ABNORMAL LOW (ref 0.61–1.24)
GFR, Estimated: >60 mL/min (ref >60)
Glucose, Bld: 120 mg/dL — ABNORMAL HIGH (ref 70–99)
Potassium: 3.9 mmol/L (ref 3.5–5.1)
Sodium: 137 mmol/L (ref 135–145)

## 2020-10-18 LAB — PHOSPHORUS: Phosphorus: 2.4 mg/dL — ABNORMAL LOW (ref 2.5–4.6)

## 2020-10-18 LAB — MAGNESIUM: Magnesium: 2.4 mg/dL (ref 1.7–2.4)

## 2020-10-18 IMAGING — DX DG CHEST 1V PORT
1 series · 1 of 1 positions shown · non-contrast
Comparison: Portable chest [DATE] and earlier.

CLINICAL DATA: 79-year-old male intubated. Lung base opacity.

EXAM:
PORTABLE CHEST 1 VIEW

[chest]
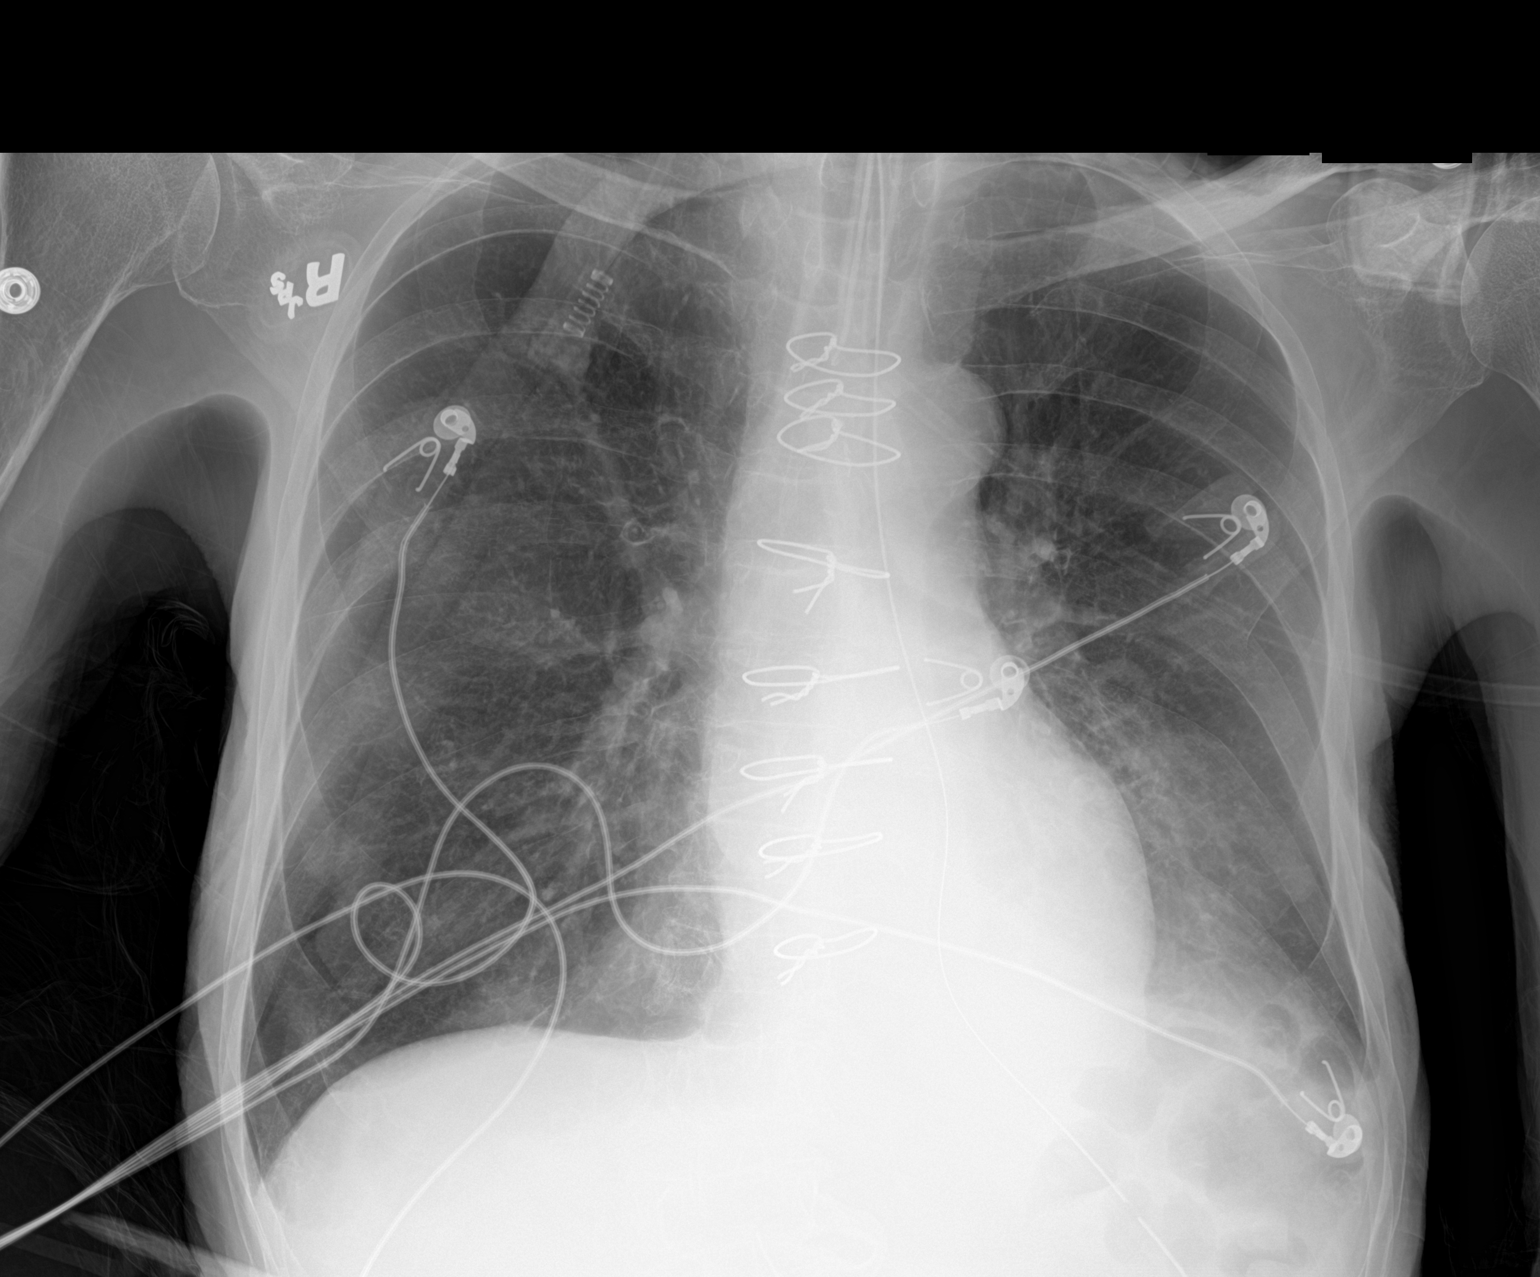

[1 of 1 positions shown; findings below may reference images not displayed]

FINDINGS: Portable AP semi upright view at [A0] hours. ET tube cuff might be
overinflated at the thoracic inlet. Tip position is satisfactory
below the clavicles. Enteric tube courses to the abdomen, side hole
at the level of the gastric fundus. Stable lung volumes. Mediastinal
contours remain within normal limits. Prior sternotomy.

Mild bilateral lung base veiling opacity continues, with small
pleural effusions, demonstrated by CT recently. Left retrocardiac
ventilation appears mildly improved from the prior. No pneumothorax,
pulmonary edema or air bronchograms.

Negative visible bowel gas. Osteopenia.
IMPRESSION: 1. ET tube cuff might be overinflated at the thoracic inlet.
2.  Stable lines and tubes.
3. Improved left lung base ventilation since [DATE]. Probable
ongoing small pleural effusions. No new cardiopulmonary abnormality.

## 2020-10-18 IMAGING — XA IR PERC PLACEMENT GASTROSTOMY
2 series · 2 of 2 positions shown · non-contrast
Comparison: none

INDICATION: 79-year-old with malnutrition. Gastrostomy tube needed for
supplemental nutrition.

[Series 1: fl (-) angio · 1 of 1 slices shown (1 of 2)]
[im 1/1]
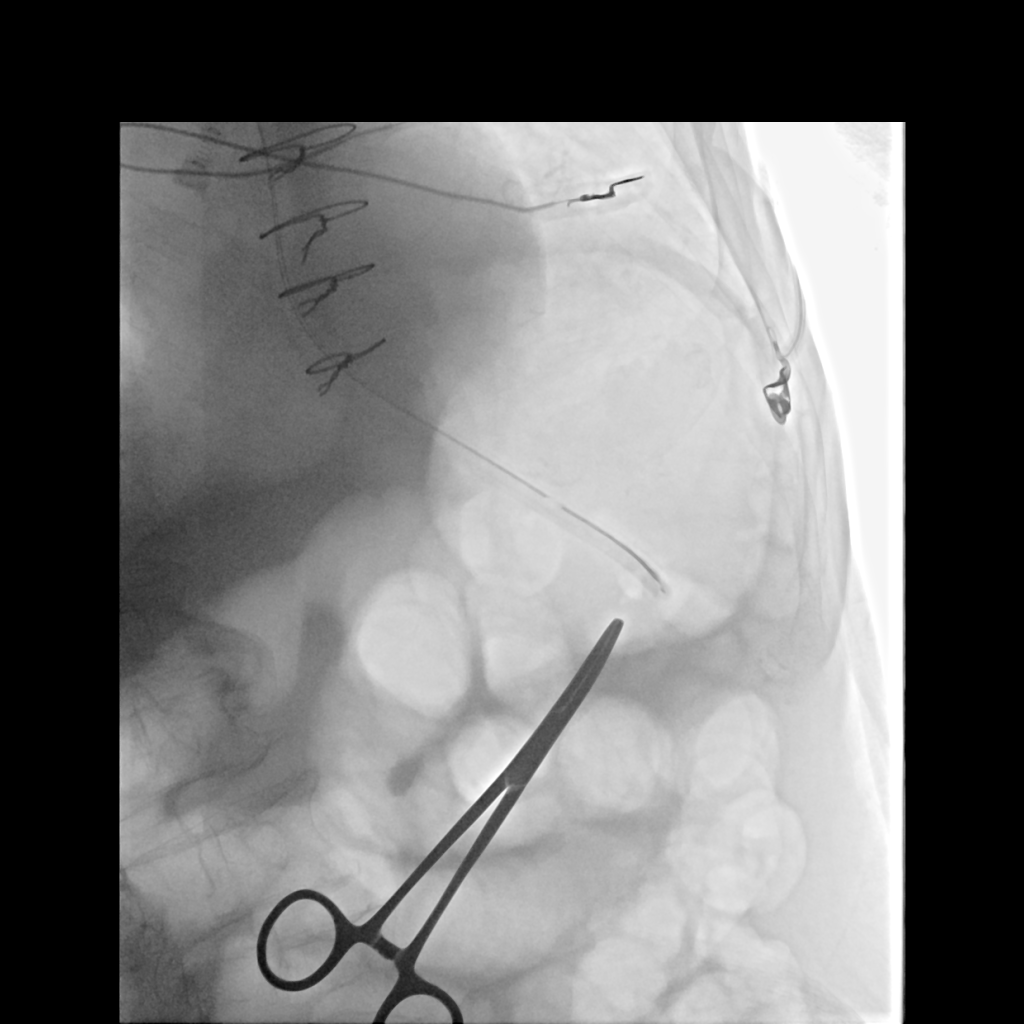

[Series 2: fl (-) angio · 1 of 1 slices shown (2 of 2)]
[im 1/1]
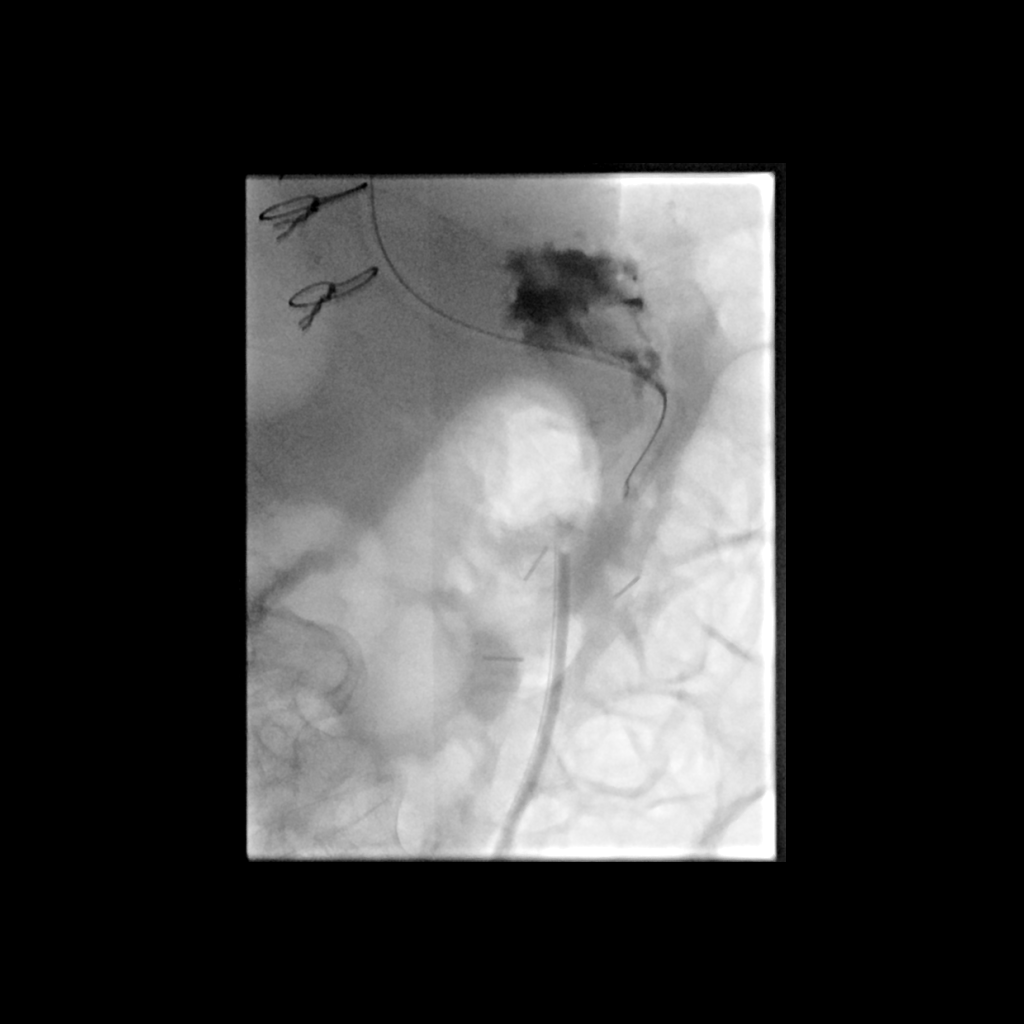

[2 of 2 positions shown; findings below may reference images not displayed]

EXAM:
PERCUTANEOUS GASTROSTOMY TUBE WITH FLUOROSCOPIC GUIDANCE

MEDICATIONS:
Ancef 2 g; Antibiotics were administered within 1 hour of the
procedure. Glucagon 0.5 mg

ANESTHESIA/SEDATION:
Versed 1.0 mg IV; Fentanyl 50 mcg IV

Moderate Sedation Time:  45 minutes

The patient was continuously monitored during the procedure by the
interventional radiology nurse under my direct supervision.

FLUOROSCOPY TIME:  Fluoroscopy Time: 9 minutes, 54 seconds, 27 mGy

COMPLICATIONS:
None immediate.

PROCEDURE:
Informed consent was obtained for a percutaneous gastrostomy tube.
The patient was placed supine on the interventional table. Patient
has endotracheal tube and nasogastric tube. Due to the endotracheal
tube, we proceeded with a push through gastrostomy tube rather than
a pull-through. The stomach was inflated using the nasogastric tube.
The anterior abdomen was prepped and draped in sterile fashion.
Maximal barrier sterile technique was utilized including caps, mask,
sterile gowns, sterile gloves, sterile drape, hand hygiene and skin
antiseptic. Stomach is identified with fluoroscopy. Anterior abdomen
was anesthetized using 1% lidocaine. Small incision was made and MARIA DE JESUS
MARIA DE JESUS fastener was advanced in the stomach using fluoroscopy. A
total of 3 T-fasteners were placed. An incision was made between the
T-fasteners. Introducer needle was directed in the stomach using
fluoroscopy. A superstiff Amplatz wire was advanced to the stomach.
The tract was dilated using the serial dilator with a peel-away
sheath. An 18 French balloon retention tube was advanced over the
wire through the peel-away sheath. Peel-away sheath was removed. The
balloon was inflated with 8 mL of saline. Contrast injection
confirmed placement in the stomach. Dressing was placed.
FINDINGS: Gastrostomy tube is positioned in the stomach. Three T-fasteners
with absorbable suture were placed.
IMPRESSION: Successful fluoroscopic guided percutaneous gastrostomy tube
placement.

## 2020-10-18 MED ORDER — STERILE WATER FOR INJECTION IJ SOLN
INTRAMUSCULAR | Status: AC
  Filled 2020-10-18: qty 10

## 2020-10-18 MED ORDER — IOHEXOL 240 MG/ML SOLN
50.0000 mL | Freq: Once | INTRAMUSCULAR | Status: AC | PRN
  Administered 2020-10-18: 10 mL via INTRAVENOUS

## 2020-10-18 MED ORDER — MIDAZOLAM HCL 2 MG/2ML IJ SOLN
INTRAMUSCULAR | Status: DC | PRN
  Administered 2020-10-18 (×2): .5 mg via INTRAVENOUS

## 2020-10-18 MED ORDER — MIDAZOLAM HCL 2 MG/2ML IJ SOLN
INTRAMUSCULAR | Status: AC
  Filled 2020-10-18: qty 2

## 2020-10-18 MED ORDER — CEFAZOLIN SODIUM-DEXTROSE 2-4 GM/100ML-% IV SOLN
INTRAVENOUS | Status: AC
  Filled 2020-10-18: qty 100

## 2020-10-18 MED ORDER — GLUCAGON HCL (RDNA) 1 MG IJ SOLR
INTRAMUSCULAR | Status: DC | PRN
  Administered 2020-10-18: .5 mg via INTRAVENOUS

## 2020-10-18 MED ORDER — LIDOCAINE HCL 1 % IJ SOLN
INTRAMUSCULAR | Status: AC
  Administered 2020-10-18: 10 mL
  Filled 2020-10-18: qty 20

## 2020-10-18 MED ORDER — GLUCAGON HCL RDNA (DIAGNOSTIC) 1 MG IJ SOLR
INTRAMUSCULAR | Status: AC
  Filled 2020-10-18: qty 1

## 2020-10-18 MED ORDER — CEFAZOLIN SODIUM-DEXTROSE 2-4 GM/100ML-% IV SOLN
2.0000 g | Freq: Three times a day (TID) | INTRAVENOUS | Status: DC
  Administered 2020-10-18: 2 g via INTRAVENOUS

## 2020-10-18 MED ORDER — FENTANYL CITRATE (PF) 100 MCG/2ML IJ SOLN
INTRAMUSCULAR | Status: DC | PRN
  Administered 2020-10-18 (×2): 25 ug via INTRAVENOUS

## 2020-10-18 MED ORDER — FENTANYL CITRATE (PF) 100 MCG/2ML IJ SOLN
INTRAMUSCULAR | Status: AC
  Filled 2020-10-18: qty 2

## 2020-10-18 NOTE — Procedures (Signed)
Interventional Radiology Procedure:   Indications: Malnutrition  Procedure: Gastrostomy tube placement  Findings: Placement of 18 Fr balloon retention gastrostomy tube.  3 T-fasteners placed  Complications: No immediate complications noted.     EBL: Minimal  Plan: May use gastrostomy for meds today.   Wait to start tube feeds until seen by IR on 9/30.   Zarianna Dicarlo R. Anselm Pancoast, MD  Pager: [phone]

## 2020-10-19 ENCOUNTER — Ambulatory Visit (HOSPITAL_COMMUNITY): Payer: Medicare Other | Attending: Nurse Practitioner

## 2020-10-19 DIAGNOSIS — M7989 Other specified soft tissue disorders: Secondary | ICD-10-CM | POA: Diagnosis not present

## 2020-10-19 LAB — APTT: aPTT: 34 seconds (ref 24–36)

## 2020-10-19 LAB — CBC
HCT: 31.5 % — ABNORMAL LOW (ref 39.0–52.0)
Hemoglobin: 10.1 g/dL — ABNORMAL LOW (ref 13.0–17.0)
MCH: 32.3 pg (ref 26.0–34.0)
MCHC: 32.1 g/dL (ref 30.0–36.0)
MCV: 100.6 fL — ABNORMAL HIGH (ref 80.0–100.0)
Platelets: 300 10*3/uL (ref 150–400)
RBC: 3.13 MIL/uL — ABNORMAL LOW (ref 4.22–5.81)
RDW: 15.9 % — ABNORMAL HIGH (ref 11.5–15.5)
WBC: 8.3 10*3/uL (ref 4.0–10.5)
nRBC: 0 % (ref 0.0–0.2)

## 2020-10-19 LAB — PROTIME-INR
INR: 1.1 (ref 0.8–1.2)
Prothrombin Time: 14.4 seconds (ref 11.4–15.2)

## 2020-10-19 LAB — HEPARIN LEVEL (UNFRACTIONATED): Heparin Unfractionated: 0.53 IU/mL (ref 0.30–0.70)

## 2020-10-19 NOTE — Progress Notes (Signed)
Pulmonary Critical Care Medicine [place]  PROGRESS NOTE     Austin Davis  XAJ:[id number]  DOB: 1940-08-13   DOA: 09/29/2020  Referring Physician: Satira Sark, MD  HPI: Austin Davis is a 80 y.o. male being followed for ventilator/airway/oxygen weaning Acute on Chronic Respiratory Failure.  Patient remains on the ventilator attempted to wean several times but patient failed now is back on full support.  ENT did see the patient for the possibility of doing a trach  Medications: Reviewed on Rounds  Physical Exam:  Vitals: Temperature is 97.7 pulse 62 respiratory rate 21 blood pressure 90/50 saturations 100%  Ventilator Settings on assist control FiO2 is 45% tidal volume 420 PEEP 5  General: Comfortable at this time Neck: supple Cardiovascular: no malignant arrhythmias Respiratory: Coarse breath sounds with few scattered rhonchi Skin: no rash seen on limited exam Musculoskeletal: No gross abnormality Psychiatric:unable to assess Neurologic:no involuntary movements         Lab Data:   Basic Metabolic Panel: Recent Labs  Lab 10/12/20 1309 10/12/20 1321 10/14/20 0408 10/16/20 0632 10/17/20 0329 10/18/20 0549  NA  --  144 144 141 139 137  K  --  3.4* 3.6 3.7 4.2 3.9  CL  --  101 102 95* 95* 98  CO2  --  37* 39* 39* 41* 33*  GLUCOSE  --  173* 128* 116* 102* 120*  BUN  --  42* 36* 31* 29* 25*  CREATININE  --  0.47* 0.43* 0.42* 0.40* 0.37*  CALCIUM  --  8.3* 8.3* 8.1* 7.7* 7.9*  MG 2.2  --  2.1 2.2 2.4 2.4  PHOS  --  3.4  --  1.0* 3.3 2.4*    ABG: Recent Labs  Lab 10/12/20 2336 10/13/20 0545 10/14/20 0855 10/14/20 1730  PHART 7.276* 7.531* 7.349* 7.515*  PCO2ART 74.9* 40.4 71.5* 47.1  PO2ART 76.3* 92.5 142* 100  HCO3 34.5* 35.3* 38.6* 38.0*  O2SAT 95.9 98.7 99.0 98.4    Liver Function Tests: Recent Labs  Lab 10/12/20 1321 10/16/20 0632  ALBUMIN 2.4* 1.9*   No results for input(s):  LIPASE, AMYLASE in the last 168 hours. No results for input(s): AMMONIA in the last 168 hours.  CBC: Recent Labs  Lab 10/12/20 1309 10/14/20 0408 10/16/20 0632 10/17/20 0329 10/18/20 0549  WBC 12.7* 12.8* 14.2* 12.9* 8.9  HGB 11.9* 10.0* 10.0* 10.0* 9.4*  HCT 37.5* 31.5* 31.5* 30.8* 28.8*  MCV 103.0* 101.3* 101.6* 101.3* 100.7*  PLT 171 164 195 228 237    Cardiac Enzymes: No results for input(s): CKTOTAL, CKMB, CKMBINDEX, TROPONINI in the last 168 hours.  BNP (last 3 results) Recent Labs    09/19/20 1606  BNP 876.9*    ProBNP (last 3 results) No results for input(s): PROBNP in the last 8760 hours.  Radiological Exams: IR GASTROSTOMY TUBE MOD SED  Result Date: 10/18/2020 INDICATION: 80 year old with malnutrition. Gastrostomy tube needed for supplemental nutrition. EXAM: PERCUTANEOUS GASTROSTOMY TUBE WITH FLUOROSCOPIC GUIDANCE Physician: Stephan Minister. Anselm Pancoast, MD MEDICATIONS: Ancef 2 g; Antibiotics were administered within 1 hour of the procedure. Glucagon 0.5 mg ANESTHESIA/SEDATION: Versed 1.0 mg IV; Fentanyl 50 mcg IV Moderate Sedation Time:  45 minutes The patient was continuously monitored during the procedure by the interventional radiology nurse under my direct supervision. FLUOROSCOPY TIME:  Fluoroscopy Time: 9 minutes, 54 seconds, 27 mGy COMPLICATIONS: None immediate. PROCEDURE: Informed consent was obtained for a percutaneous gastrostomy tube. The patient was placed supine on  the interventional table. Patient has endotracheal tube and nasogastric tube. Due to the endotracheal tube, we proceeded with a push through gastrostomy tube rather than a pull-through. The stomach was inflated using the nasogastric tube. The anterior abdomen was prepped and draped in sterile fashion. Maximal barrier sterile technique was utilized including caps, mask, sterile gowns, sterile gloves, sterile drape, hand hygiene and skin antiseptic. Stomach is identified with fluoroscopy. Anterior abdomen was  anesthetized using 1% lidocaine. Small incision was made and a [id number]-[id number] fastener was advanced in the stomach using fluoroscopy. A total of 3 T-fasteners were placed. An incision was made between the T-fasteners. Introducer needle was directed in the stomach using fluoroscopy. A superstiff Amplatz wire was advanced to the stomach. The tract was dilated using the serial dilator with a peel-away sheath. An 57 French balloon retention tube was advanced over the wire through the peel-away sheath. Peel-away sheath was removed. The balloon was inflated with 8 mL of saline. Contrast injection confirmed placement in the stomach. Dressing was placed. FINDINGS: Gastrostomy tube is positioned in the stomach. Three T-fasteners with absorbable suture were placed. IMPRESSION: Successful fluoroscopic guided percutaneous gastrostomy tube placement. Electronically Signed   By: Markus Daft M.D.   On: 10/18/2020 11:00   DG CHEST PORT 1 VIEW  Result Date: 10/18/2020 CLINICAL DATA:  80 year old male intubated. Lung base opacity. EXAM: PORTABLE CHEST 1 VIEW COMPARISON:  Portable chest 10/15/2020 and earlier. FINDINGS: Portable AP semi upright view at 0607 hours. ET tube cuff might be overinflated at the thoracic inlet. Tip position is satisfactory below the clavicles. Enteric tube courses to the abdomen, side hole at the level of the gastric fundus. Stable lung volumes. Mediastinal contours remain within normal limits. Prior sternotomy. Mild bilateral lung base veiling opacity continues, with small pleural effusions, demonstrated by CT recently. Left retrocardiac ventilation appears mildly improved from the prior. No pneumothorax, pulmonary edema or air bronchograms. Negative visible bowel gas. Osteopenia. IMPRESSION: 1. ET tube cuff might be overinflated at the thoracic inlet. 2.  Stable lines and tubes. 3. Improved left lung base ventilation since 10/15/2020. Probable ongoing small pleural effusions. No new cardiopulmonary  abnormality. Electronically Signed   By: Genevie Ann M.D.   On: 10/18/2020 06:39    Assessment/Plan Active Problems:   Acute on chronic respiratory failure with hypoxia and hypercapnia (HCC)   AF (paroxysmal atrial fibrillation) (HCC)   Obstructive sleep apnea   COPD, severe (HCC)   Chronic heart failure with preserved ejection fraction (HCC)   Acute on chronic respiratory failure with hypoxia patient has been failing attempts at weaning plan is going to be to proceed with tracheostomy.  ENT has seen the patient and will be doing a trach next week Atrial fibrillation right now rate controlled we will continue to monitor on telemetry Obstructive sleep apnea nonissue Severe COPD medical management we will continue to follow along closely Chronic heart failure preserved ejection fraction at baseline   I have personally seen and evaluated the patient, evaluated laboratory and imaging results, formulated the assessment and plan and placed orders. The Patient requires high complexity decision making with multiple systems involvement.  Rounds were done with the Respiratory Therapy Director and Staff therapists and discussed with nursing staff also.  Allyne Gee, MD [facility] Pulmonary Critical Care Medicine Sleep Medicine

## 2020-10-19 NOTE — Progress Notes (Signed)
Lower extremity venous bilateral study completed.  Preliminary results relayed to Nicoletta Dress, NP  See CV Proc for preliminary results report.   Darlin Coco, RDMS, RVT

## 2020-10-20 LAB — BASIC METABOLIC PANEL
Anion gap: 5 (ref 5–15)
BUN: 18 mg/dL (ref 8–23)
CO2: 31 mmol/L (ref 22–32)
Calcium: 7.8 mg/dL — ABNORMAL LOW (ref 8.9–10.3)
Chloride: 96 mmol/L — ABNORMAL LOW (ref 98–111)
Creatinine, Ser: 0.43 mg/dL — ABNORMAL LOW (ref 0.61–1.24)
GFR, Estimated: >60 mL/min (ref >60)
Glucose, Bld: 143 mg/dL — ABNORMAL HIGH (ref 70–99)
Potassium: 3.9 mmol/L (ref 3.5–5.1)
Sodium: 132 mmol/L — ABNORMAL LOW (ref 135–145)

## 2020-10-20 LAB — HEPARIN LEVEL (UNFRACTIONATED)
Heparin Unfractionated: 0.2 IU/mL — ABNORMAL LOW (ref 0.30–0.70)
Heparin Unfractionated: 0.18 IU/mL — ABNORMAL LOW (ref 0.30–0.70)
Heparin Unfractionated: 0.32 IU/mL (ref 0.30–0.70)

## 2020-10-20 LAB — MAGNESIUM: Magnesium: 2.1 mg/dL (ref 1.7–2.4)

## 2020-10-20 LAB — PHOSPHORUS: Phosphorus: 2 mg/dL — ABNORMAL LOW (ref 2.5–4.6)

## 2020-10-20 NOTE — Progress Notes (Signed)
Pulmonary Critical Care Medicine [place]  PROGRESS NOTE     Austin Davis  [id number]:[id number]  DOB: 01/10/1941   DOA: 09/29/2020  Referring Physician: Satira Sark, MD  HPI: Austin Davis is a 80 y.o. male being followed for ventilator/airway/oxygen weaning Acute on Chronic Respiratory Failure.  Patient seen at bedside patient's girlfriend was present at the bedside.  He is awake appears to be alert and oriented  Medications: Reviewed on Rounds  Physical Exam:  Vitals: Temperature is 97.8 pulse 76 respiratory 21 blood pressure is 93/58 saturations 97%  Ventilator Settings currently on assist control FiO2 45% tidal volume 420 PEEP 5  General: Comfortable at this time Neck: supple Cardiovascular: no malignant arrhythmias Respiratory: Scattered rhonchi expansion is equal at this time Skin: no rash seen on limited exam Musculoskeletal: No gross abnormality Psychiatric:unable to assess Neurologic:no involuntary movements         Lab Data:   Basic Metabolic Panel: Recent Labs  Lab 10/14/20 0408 10/16/20 0632 10/17/20 0329 10/18/20 0549 10/20/20 0658  NA 144 141 139 137 132*  K 3.6 3.7 4.2 3.9 3.9  CL 102 95* 95* 98 96*  CO2 39* 39* 41* 33* 31  GLUCOSE 128* 116* 102* 120* 143*  BUN 36* 31* 29* 25* 18  CREATININE 0.43* 0.42* 0.40* 0.37* 0.43*  CALCIUM 8.3* 8.1* 7.7* 7.9* 7.8*  MG 2.1 2.2 2.4 2.4 2.1  PHOS  --  1.0* 3.3 2.4* 2.0*    ABG: Recent Labs  Lab 10/14/20 0855 10/14/20 1730  PHART 7.349* 7.515*  PCO2ART 71.5* 47.1  PO2ART 142* 100  HCO3 38.6* 38.0*  O2SAT 99.0 98.4    Liver Function Tests: Recent Labs  Lab 10/16/20 0632  ALBUMIN 1.9*   No results for input(s): LIPASE, AMYLASE in the last 168 hours. No results for input(s): AMMONIA in the last 168 hours.  CBC: Recent Labs  Lab 10/14/20 0408 10/16/20 5188 10/17/20 0329 10/18/20 0549 10/19/20 1640  WBC 12.8* 14.2* 12.9* 8.9  8.3  HGB 10.0* 10.0* 10.0* 9.4* 10.1*  HCT 31.5* 31.5* 30.8* 28.8* 31.5*  MCV 101.3* 101.6* 101.3* 100.7* 100.6*  PLT 164 195 228 237 300    Cardiac Enzymes: No results for input(s): CKTOTAL, CKMB, CKMBINDEX, TROPONINI in the last 168 hours.  BNP (last 3 results) Recent Labs    09/19/20 1606  BNP 876.9*    ProBNP (last 3 results) No results for input(s): PROBNP in the last 8760 hours.  Radiological Exams: VAS [place] LOWER EXTREMITY VENOUS (DVT)  Result Date: 10/19/2020  Lower Venous DVT Study Patient Name:  Austin Davis  Date of Exam:   10/19/2020 Medical Rec #: [id number]         Accession #:    [id number] Date of Birth: [birth year]        Patient Gender: M Patient Age:   5 years Exam Location:  [facility] Procedure:      VAS [place] LOWER EXTREMITY VENOUS (DVT) Referring Phys: CHUN LI --------------------------------------------------------------------------------  Indications: Swelling.  Anticoagulation: Coumadin for A-fib recently held for procedure. Comparison Study: No prior studies available. Performing Technologist: Darlin Coco RDMS, RVT  Examination Guidelines: A complete evaluation includes B-mode imaging, spectral Doppler, color Doppler, and power Doppler as needed of all accessible portions of each vessel. Bilateral testing is considered an integral part of a complete examination. Limited examinations for reoccurring indications may be performed as noted. The reflux portion of the exam is performed with  the patient in reverse Trendelenburg.  +---------+---------------+---------+-----------+----------+--------------+ RIGHT    CompressibilityPhasicitySpontaneityPropertiesThrombus Aging +---------+---------------+---------+-----------+----------+--------------+ CFV      Full           Yes      Yes                                 +---------+---------------+---------+-----------+----------+--------------+ SFJ      Partial                                                      +---------+---------------+---------+-----------+----------+--------------+ FV Prox  Full                                                        +---------+---------------+---------+-----------+----------+--------------+ FV Mid   Full                                                        +---------+---------------+---------+-----------+----------+--------------+ FV DistalFull                                                        +---------+---------------+---------+-----------+----------+--------------+ PFV      Full                                                        +---------+---------------+---------+-----------+----------+--------------+ POP      Full           Yes      Yes                                 +---------+---------------+---------+-----------+----------+--------------+ PTV      Full                                                        +---------+---------------+---------+-----------+----------+--------------+ PERO     Full                                                        +---------+---------------+---------+-----------+----------+--------------+ GSV      Partial        Yes      Yes                  Acute          +---------+---------------+---------+-----------+----------+--------------+   +---------+---------------+---------+-----------+---------------+--------------+  LEFT     CompressibilityPhasicitySpontaneityProperties     Thrombus Aging +---------+---------------+---------+-----------+---------------+--------------+ CFV      Partial        Yes      Yes        Mobile, poorly Acute                                                      attached                                                                  thrombus                      +---------+---------------+---------+-----------+---------------+--------------+ SFJ      Partial                                                           +---------+---------------+---------+-----------+---------------+--------------+ FV Prox  Partial                                                          +---------+---------------+---------+-----------+---------------+--------------+ FV Mid   Partial        Yes      Yes                       Acute          +---------+---------------+---------+-----------+---------------+--------------+ FV Distal               Yes      Yes                                      +---------+---------------+---------+-----------+---------------+--------------+ PFV                     Yes      Yes                                      +---------+---------------+---------+-----------+---------------+--------------+ POP                     Yes      Yes                                      +---------+---------------+---------+-----------+---------------+--------------+ PTV      Partial        Yes      Yes  Age                                                                       Indeterminate  +---------+---------------+---------+-----------+---------------+--------------+ PERO                                                       Not well                                                                  visualized     +---------+---------------+---------+-----------+---------------+--------------+    Summary: RIGHT: - Findings consistent with acute superficial vein thrombosis involving the right great saphenous vein. - No cystic structure found in the popliteal fossa.  LEFT: - Findings consistent with acute deep vein thrombosis involving the left common femoral vein, SF junction, and left femoral vein. - No cystic structure found in the popliteal fossa.  *See table(s) above for measurements and observations.    Preliminary     Assessment/Plan Active Problems:   Acute on chronic respiratory failure with hypoxia and hypercapnia (HCC)   AF (paroxysmal  atrial fibrillation) (HCC)   Obstructive sleep apnea   COPD, severe (HCC)   Chronic heart failure with preserved ejection fraction (HCC)   Acute on chronic respiratory failure hypoxia patient is scheduled for tracheostomy next week we will continue with full vent support right now. Atrial fibrillation rate controlled we will continue with supportive care medical management monitor on telemetry Obstructive sleep apnea nonissue at this time patient is on the ventilator Severe COPD medical management Medical failure preserved ejection fraction we will continue with supportive care   I have personally seen and evaluated the patient, evaluated laboratory and imaging results, formulated the assessment and plan and placed orders. The Patient requires high complexity decision making with multiple systems involvement.  Rounds were done with the Respiratory Therapy Director and Staff therapists and discussed with nursing staff also.  Allyne Gee, MD [facility] Pulmonary Critical Care Medicine Sleep Medicine

## 2020-10-21 LAB — BASIC METABOLIC PANEL
Anion gap: 9 (ref 5–15)
BUN: 19 mg/dL (ref 8–23)
CO2: 29 mmol/L (ref 22–32)
Calcium: 7.6 mg/dL — ABNORMAL LOW (ref 8.9–10.3)
Chloride: 96 mmol/L — ABNORMAL LOW (ref 98–111)
Creatinine, Ser: 0.4 mg/dL — ABNORMAL LOW (ref 0.61–1.24)
GFR, Estimated: >60 mL/min (ref >60)
Glucose, Bld: 173 mg/dL — ABNORMAL HIGH (ref 70–99)
Potassium: 3.6 mmol/L (ref 3.5–5.1)
Sodium: 134 mmol/L — ABNORMAL LOW (ref 135–145)

## 2020-10-21 LAB — CBC
HCT: 29.2 % — ABNORMAL LOW (ref 39.0–52.0)
Hemoglobin: 9.5 g/dL — ABNORMAL LOW (ref 13.0–17.0)
MCH: 32.5 pg (ref 26.0–34.0)
MCHC: 32.5 g/dL (ref 30.0–36.0)
MCV: 100 fL (ref 80.0–100.0)
Platelets: 314 10*3/uL (ref 150–400)
RBC: 2.92 MIL/uL — ABNORMAL LOW (ref 4.22–5.81)
RDW: 15.9 % — ABNORMAL HIGH (ref 11.5–15.5)
WBC: 9.3 10*3/uL (ref 4.0–10.5)
nRBC: 0 % (ref 0.0–0.2)

## 2020-10-21 LAB — HEPARIN LEVEL (UNFRACTIONATED): Heparin Unfractionated: 0.26 IU/mL — ABNORMAL LOW (ref 0.30–0.70)

## 2020-10-21 LAB — PHOSPHORUS: Phosphorus: 2.3 mg/dL — ABNORMAL LOW (ref 2.5–4.6)

## 2020-10-21 LAB — MAGNESIUM: Magnesium: 2 mg/dL (ref 1.7–2.4)

## 2020-10-22 LAB — BASIC METABOLIC PANEL
Anion gap: 7 (ref 5–15)
BUN: 16 mg/dL (ref 8–23)
CO2: 30 mmol/L (ref 22–32)
Calcium: 7.6 mg/dL — ABNORMAL LOW (ref 8.9–10.3)
Chloride: 97 mmol/L — ABNORMAL LOW (ref 98–111)
Creatinine, Ser: 0.32 mg/dL — ABNORMAL LOW (ref 0.61–1.24)
GFR, Estimated: >60 mL/min (ref >60)
Glucose, Bld: 113 mg/dL — ABNORMAL HIGH (ref 70–99)
Potassium: 3.5 mmol/L (ref 3.5–5.1)
Sodium: 134 mmol/L — ABNORMAL LOW (ref 135–145)

## 2020-10-22 LAB — HEPARIN LEVEL (UNFRACTIONATED)
Heparin Unfractionated: 0.21 IU/mL — ABNORMAL LOW (ref 0.30–0.70)
Heparin Unfractionated: 0.41 IU/mL (ref 0.30–0.70)
Heparin Unfractionated: 0.21 IU/mL — ABNORMAL LOW (ref 0.30–0.70)

## 2020-10-22 LAB — PHOSPHORUS: Phosphorus: 2.4 mg/dL — ABNORMAL LOW (ref 2.5–4.6)

## 2020-10-22 LAB — MAGNESIUM: Magnesium: 2 mg/dL (ref 1.7–2.4)

## 2020-10-22 NOTE — Progress Notes (Signed)
Pulmonary Critical Care Medicine [place]  PROGRESS NOTE     Austin Davis  [id number]:[id number]  DOB: [birth year]   DOA: 09/29/2020  Referring Physician: Satira Sark, MD  HPI: Austin Davis is a 80 y.o. male being followed for ventilator/airway/oxygen weaning Acute on Chronic Respiratory Failure.  Patient is resting comfortably right now without distress has been on pressure support right now on 40% FiO2 pressure 12/5  Medications: Reviewed on Rounds  Physical Exam:  Vitals: Temperature is 98.4 pulse 93 respiratory 28 blood pressure is one 5/60 saturations 100  Ventilator Settings on pressure support FiO2 is 40% pressure 12/5  General: Comfortable at this time Neck: supple Cardiovascular: no malignant arrhythmias Respiratory: Scattered rhonchi expansion is equal at this time Skin: no rash seen on limited exam Musculoskeletal: No gross abnormality Psychiatric:unable to assess Neurologic:no involuntary movements         Lab Data:   Basic Metabolic Panel: Recent Labs  Lab 10/17/20 0329 10/18/20 0549 10/20/20 0658 10/21/20 0607 10/22/20 0341  NA 139 137 132* 134* 134*  K 4.2 3.9 3.9 3.6 3.5  CL 95* 98 96* 96* 97*  CO2 41* 33* 31 29 30   GLUCOSE 102* 120* 143* 173* 113*  BUN 29* 25* 18 19 16   CREATININE 0.40* 0.37* 0.43* 0.40* 0.32*  CALCIUM 7.7* 7.9* 7.8* 7.6* 7.6*  MG 2.4 2.4 2.1 2.0 2.0  PHOS 3.3 2.4* 2.0* 2.3* 2.4*    ABG: No results for input(s): PHART, PCO2ART, PO2ART, HCO3, O2SAT in the last 168 hours.  Liver Function Tests: Recent Labs  Lab 10/16/20 7209  ALBUMIN 1.9*   No results for input(s): LIPASE, AMYLASE in the last 168 hours. No results for input(s): AMMONIA in the last 168 hours.  CBC: Recent Labs  Lab 10/16/20 0632 10/17/20 0329 10/18/20 0549 10/19/20 1640 10/21/20 0607  WBC 14.2* 12.9* 8.9 8.3 9.3  HGB 10.0* 10.0* 9.4* 10.1* 9.5*  HCT 31.5* 30.8* 28.8* 31.5* 29.2*   MCV 101.6* 101.3* 100.7* 100.6* 100.0  PLT 195 228 237 300 314    Cardiac Enzymes: No results for input(s): CKTOTAL, CKMB, CKMBINDEX, TROPONINI in the last 168 hours.  BNP (last 3 results) Recent Labs    09/19/20 1606  BNP 876.9*    ProBNP (last 3 results) No results for input(s): PROBNP in the last 8760 hours.  Radiological Exams: No results found.  Assessment/Plan Active Problems:   Acute on chronic respiratory failure with hypoxia and hypercapnia (HCC)   AF (paroxysmal atrial fibrillation) (HCC)   Obstructive sleep apnea   COPD, severe (HCC)   Chronic heart failure with preserved ejection fraction (HCC)   Acute on chronic respiratory failure with hypoxia continue with the pressure support for now.  Patient is scheduled for tracheostomy by the end of this week ENT is on vacation until Thursday and they will do the procedure when they are back Atrial fibrillation right now rate is controlled we will continue to monitor along closely. Obstructive sleep apnea patient is at baseline Severe COPD medical management Chronic heart failure preserved ejection fraction continue with supportive care   I have personally seen and evaluated the patient, evaluated laboratory and imaging results, formulated the assessment and plan and placed orders. The Patient requires high complexity decision making with multiple systems involvement.  Rounds were done with the Respiratory Therapy Director and Staff therapists and discussed with nursing staff also.  Allyne Gee, MD [facility] Pulmonary Critical Care Medicine Sleep Medicine

## 2020-10-23 LAB — HEPARIN LEVEL (UNFRACTIONATED)
Heparin Unfractionated: 0.23 IU/mL — ABNORMAL LOW (ref 0.30–0.70)
Heparin Unfractionated: 0.52 IU/mL (ref 0.30–0.70)
Heparin Unfractionated: 0.34 IU/mL (ref 0.30–0.70)

## 2020-10-23 NOTE — Progress Notes (Signed)
Pulmonary Critical Care Medicine [place]  PROGRESS NOTE     Austin Davis  EXN:[id number]  DOB: [birth year]   DOA: 09/29/2020  Referring Physician: Satira Sark, MD  HPI: Austin Davis is a 80 y.o. male being followed for ventilator/airway/oxygen weaning Acute on Chronic Respiratory Failure.  Patient is on pressure support now pressure 12/5 awaiting tracheostomy this week  Medications: Reviewed on Rounds  Physical Exam:  Vitals: Temperature is 98.0 pulse 76 respiratory 22 blood pressure is 132/71 saturations 100%  Ventilator Settings on pressure support FiO2 35% pressure 12/5  General: Comfortable at this time Neck: supple Cardiovascular: no malignant arrhythmias Respiratory: Coarse rhonchi expansion is equal Skin: no rash seen on limited exam Musculoskeletal: No gross abnormality Psychiatric:unable to assess Neurologic:no involuntary movements         Lab Data:   Basic Metabolic Panel: Recent Labs  Lab 10/17/20 0329 10/18/20 0549 10/20/20 0658 10/21/20 0607 10/22/20 0341  NA 139 137 132* 134* 134*  K 4.2 3.9 3.9 3.6 3.5  CL 95* 98 96* 96* 97*  CO2 41* 33* 31 29 30   GLUCOSE 102* 120* 143* 173* 113*  BUN 29* 25* 18 19 16   CREATININE 0.40* 0.37* 0.43* 0.40* 0.32*  CALCIUM 7.7* 7.9* 7.8* 7.6* 7.6*  MG 2.4 2.4 2.1 2.0 2.0  PHOS 3.3 2.4* 2.0* 2.3* 2.4*    ABG: No results for input(s): PHART, PCO2ART, PO2ART, HCO3, O2SAT in the last 168 hours.  Liver Function Tests: No results for input(s): AST, ALT, ALKPHOS, BILITOT, PROT, ALBUMIN in the last 168 hours. No results for input(s): LIPASE, AMYLASE in the last 168 hours. No results for input(s): AMMONIA in the last 168 hours.  CBC: Recent Labs  Lab 10/17/20 0329 10/18/20 0549 10/19/20 1640 10/21/20 0607  WBC 12.9* 8.9 8.3 9.3  HGB 10.0* 9.4* 10.1* 9.5*  HCT 30.8* 28.8* 31.5* 29.2*  MCV 101.3* 100.7* 100.6* 100.0  PLT 228 237 300 314     Cardiac Enzymes: No results for input(s): CKTOTAL, CKMB, CKMBINDEX, TROPONINI in the last 168 hours.  BNP (last 3 results) Recent Labs    09/19/20 1606  BNP 876.9*    ProBNP (last 3 results) No results for input(s): PROBNP in the last 8760 hours.  Radiological Exams: No results found.  Assessment/Plan Active Problems:   Acute on chronic respiratory failure with hypoxia and hypercapnia (HCC)   AF (paroxysmal atrial fibrillation) (HCC)   Obstructive sleep apnea   COPD, severe (HCC)   Chronic heart failure with preserved ejection fraction (HCC)   Acute on chronic respiratory failure with hypoxia we will continue with the pressure support right now awaiting tracheostomy which should be done by the end of the week Severe COPD nebulizers as needed continue with medical management Atrial fibrillation right now rate is controlled on telemetry Sleep apnea nonissue for tracheostomy Friday Chronic heart failure compensated   I have personally seen and evaluated the patient, evaluated laboratory and imaging results, formulated the assessment and plan and placed orders. The Patient requires high complexity decision making with multiple systems involvement.  Rounds were done with the Respiratory Therapy Director and Staff therapists and discussed with nursing staff also.  Allyne Gee, MD [facility] Pulmonary Critical Care Medicine Sleep Medicine

## 2020-10-24 DIAGNOSIS — J449 Chronic obstructive pulmonary disease, unspecified: Secondary | ICD-10-CM

## 2020-10-24 DIAGNOSIS — G4733 Obstructive sleep apnea (adult) (pediatric): Secondary | ICD-10-CM

## 2020-10-24 DIAGNOSIS — I5032 Chronic diastolic (congestive) heart failure: Secondary | ICD-10-CM

## 2020-10-24 LAB — CBC
HCT: 26.5 % — ABNORMAL LOW (ref 39.0–52.0)
Hemoglobin: 8.9 g/dL — ABNORMAL LOW (ref 13.0–17.0)
MCH: 33.6 pg (ref 26.0–34.0)
MCHC: 33.6 g/dL (ref 30.0–36.0)
MCV: 100 fL (ref 80.0–100.0)
Platelets: 303 10*3/uL (ref 150–400)
RBC: 2.65 MIL/uL — ABNORMAL LOW (ref 4.22–5.81)
RDW: 16.7 % — ABNORMAL HIGH (ref 11.5–15.5)
WBC: 9.9 10*3/uL (ref 4.0–10.5)
nRBC: 0 % (ref 0.0–0.2)

## 2020-10-24 LAB — MAGNESIUM: Magnesium: 2.1 mg/dL (ref 1.7–2.4)

## 2020-10-24 LAB — BASIC METABOLIC PANEL
Anion gap: 6 (ref 5–15)
BUN: 15 mg/dL (ref 8–23)
CO2: 32 mmol/L (ref 22–32)
Calcium: 7.8 mg/dL — ABNORMAL LOW (ref 8.9–10.3)
Chloride: 93 mmol/L — ABNORMAL LOW (ref 98–111)
Creatinine, Ser: 0.41 mg/dL — ABNORMAL LOW (ref 0.61–1.24)
GFR, Estimated: >60 mL/min (ref >60)
Glucose, Bld: 112 mg/dL — ABNORMAL HIGH (ref 70–99)
Potassium: 4.1 mmol/L (ref 3.5–5.1)
Sodium: 131 mmol/L — ABNORMAL LOW (ref 135–145)

## 2020-10-24 LAB — HEPARIN LEVEL (UNFRACTIONATED): Heparin Unfractionated: 0.27 IU/mL — ABNORMAL LOW (ref 0.30–0.70)

## 2020-10-24 NOTE — Progress Notes (Signed)
Pulmonary Critical Care Medicine [place]  PROGRESS NOTE     Austin Davis  KTG:[id number]  DOB: [birth year]   DOA: 09/29/2020  Referring Physician: Satira Sark, MD  HPI: Austin Davis is a 80 y.o. male being followed for ventilator/airway/oxygen weaning Acute on Chronic Respiratory Failure.  Patient is comfortable right now without distress has been afebrile has tracheostomy scheduled for Friday  Medications: Reviewed on Rounds  Physical Exam:  Vitals: Temperature is 98.4 pulse 81 respiratory 21 blood pressure is 142/70 saturations 96%  Ventilator Settings on pressure support FiO2 is 45% pressure 12/5  General: Comfortable at this time Neck: supple Cardiovascular: no malignant arrhythmias Respiratory: Scattered rhonchi expansion is equal Skin: no rash seen on limited exam Musculoskeletal: No gross abnormality Psychiatric:unable to assess Neurologic:no involuntary movements         Lab Data:   Basic Metabolic Panel: Recent Labs  Lab 10/18/20 0549 10/20/20 0658 10/21/20 0607 10/22/20 0341 10/24/20 0521  NA 137 132* 134* 134* 131*  K 3.9 3.9 3.6 3.5 4.1  CL 98 96* 96* 97* 93*  CO2 33* 31 29 30  32  GLUCOSE 120* 143* 173* 113* 112*  BUN 25* 18 19 16 15   CREATININE 0.37* 0.43* 0.40* 0.32* 0.41*  CALCIUM 7.9* 7.8* 7.6* 7.6* 7.8*  MG 2.4 2.1 2.0 2.0 2.1  PHOS 2.4* 2.0* 2.3* 2.4*  --     ABG: No results for input(s): PHART, PCO2ART, PO2ART, HCO3, O2SAT in the last 168 hours.  Liver Function Tests: No results for input(s): AST, ALT, ALKPHOS, BILITOT, PROT, ALBUMIN in the last 168 hours. No results for input(s): LIPASE, AMYLASE in the last 168 hours. No results for input(s): AMMONIA in the last 168 hours.  CBC: Recent Labs  Lab 10/18/20 0549 10/19/20 1640 10/21/20 0607 10/24/20 0521  WBC 8.9 8.3 9.3 9.9  HGB 9.4* 10.1* 9.5* 8.9*  HCT 28.8* 31.5* 29.2* 26.5*  MCV 100.7* 100.6* 100.0 100.0   PLT 237 300 314 303    Cardiac Enzymes: No results for input(s): CKTOTAL, CKMB, CKMBINDEX, TROPONINI in the last 168 hours.  BNP (last 3 results) Recent Labs    09/19/20 1606  BNP 876.9*    ProBNP (last 3 results) No results for input(s): PROBNP in the last 8760 hours.  Radiological Exams: No results found.  Assessment/Plan Active Problems:   Acute on chronic respiratory failure with hypoxia and hypercapnia (HCC)   AF (paroxysmal atrial fibrillation) (HCC)   Obstructive sleep apnea   COPD, severe (HCC)   Chronic heart failure with preserved ejection fraction (HCC)   Acute on chronic respiratory failure hypoxia we will continue with pressure support currently on 45% FiO2 with a pressure of 12/5.  Patient supposed to have tracheostomy done Friday Atrial fibrillation right now rate is controlled we will continue with supportive care Severe COPD nebulizers continue with medical management Obstructive sleep apnea nonissue at this time patient is on the vent Chronic heart failure preserved ejection fraction at baseline   I have personally seen and evaluated the patient, evaluated laboratory and imaging results, formulated the assessment and plan and placed orders. The Patient requires high complexity decision making with multiple systems involvement.  Rounds were done with the Respiratory Therapy Director and Staff therapists and discussed with nursing staff also.  Allyne Gee, MD [facility] Pulmonary Critical Care Medicine Sleep Medicine

## 2020-10-25 ENCOUNTER — Ambulatory Visit (INDEPENDENT_AMBULATORY_CARE_PROVIDER_SITE_OTHER): Payer: Self-pay | Admitting: Otolaryngology

## 2020-10-25 ENCOUNTER — Encounter (HOSPITAL_BASED_OUTPATIENT_CLINIC_OR_DEPARTMENT_OTHER): Payer: Medicare Other

## 2020-10-25 ENCOUNTER — Other Ambulatory Visit (HOSPITAL_COMMUNITY): Payer: Medicare Other

## 2020-10-25 DIAGNOSIS — J9622 Acute and chronic respiratory failure with hypercapnia: Secondary | ICD-10-CM

## 2020-10-25 DIAGNOSIS — I5032 Chronic diastolic (congestive) heart failure: Secondary | ICD-10-CM | POA: Diagnosis not present

## 2020-10-25 DIAGNOSIS — J9621 Acute and chronic respiratory failure with hypoxia: Secondary | ICD-10-CM

## 2020-10-25 DIAGNOSIS — J449 Chronic obstructive pulmonary disease, unspecified: Secondary | ICD-10-CM

## 2020-10-25 DIAGNOSIS — I480 Paroxysmal atrial fibrillation: Secondary | ICD-10-CM | POA: Diagnosis not present

## 2020-10-25 DIAGNOSIS — G4733 Obstructive sleep apnea (adult) (pediatric): Secondary | ICD-10-CM | POA: Diagnosis not present

## 2020-10-25 LAB — CBC
HCT: 25.6 % — ABNORMAL LOW (ref 39.0–52.0)
Hemoglobin: 8.3 g/dL — ABNORMAL LOW (ref 13.0–17.0)
MCH: 33.1 pg (ref 26.0–34.0)
MCHC: 32.4 g/dL (ref 30.0–36.0)
MCV: 102 fL — ABNORMAL HIGH (ref 80.0–100.0)
Platelets: 266 10*3/uL (ref 150–400)
RBC: 2.51 MIL/uL — ABNORMAL LOW (ref 4.22–5.81)
RDW: 16.7 % — ABNORMAL HIGH (ref 11.5–15.5)
WBC: 8.2 10*3/uL (ref 4.0–10.5)
nRBC: 0 % (ref 0.0–0.2)

## 2020-10-25 LAB — RENAL FUNCTION PANEL
Albumin: 1.7 g/dL — ABNORMAL LOW (ref 3.5–5.0)
Anion gap: 6 (ref 5–15)
BUN: 13 mg/dL (ref 8–23)
CO2: 32 mmol/L (ref 22–32)
Calcium: 7.9 mg/dL — ABNORMAL LOW (ref 8.9–10.3)
Chloride: 96 mmol/L — ABNORMAL LOW (ref 98–111)
Creatinine, Ser: 0.4 mg/dL — ABNORMAL LOW (ref 0.61–1.24)
GFR, Estimated: >60 mL/min (ref >60)
Glucose, Bld: 114 mg/dL — ABNORMAL HIGH (ref 70–99)
Phosphorus: 2.8 mg/dL (ref 2.5–4.6)
Potassium: 4.2 mmol/L (ref 3.5–5.1)
Sodium: 134 mmol/L — ABNORMAL LOW (ref 135–145)

## 2020-10-25 LAB — FOLATE: Folate: 14.8 ng/mL (ref >5.9)

## 2020-10-25 LAB — APTT: aPTT: 30 seconds (ref 24–36)

## 2020-10-25 LAB — VITAMIN B12: Vitamin B-12: 633 pg/mL (ref 180–914)

## 2020-10-25 IMAGING — CT CT ANGIO CHEST
3 of 7 series · 18 of 36 positions shown · IV contrast (omnipaque)
Comparison: [DATE]

CLINICAL DATA: Shortness of breath.

EXAM:
CT ANGIOGRAPHY CHEST WITH CONTRAST
TECHNIQUE: Multidetector CT imaging of the chest was performed using the
standard protocol during bolus administration of intravenous
contrast. Multiplanar CT image reconstructions and MIPs were
obtained to evaluate the vascular anatomy.
CONTRAST:  50mL OMNIPAQUE IOHEXOL 350 MG/ML SOLN

[Series 6: pe lung · axial · 0.70mm/px · z∈[+1439,+1511]mm · 2 of 144 slices shown]
[im 36/144  mediastinal]
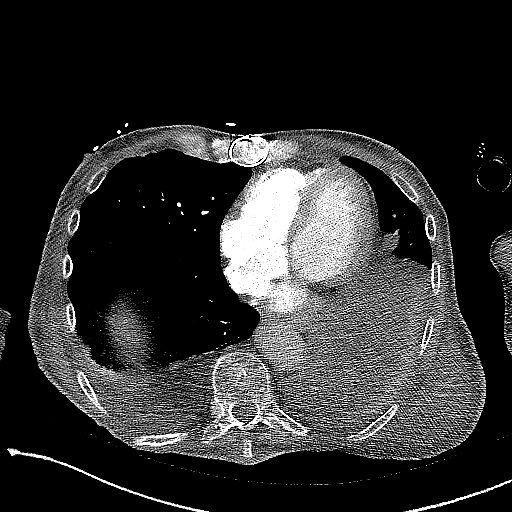
[im 72/144  mediastinal]
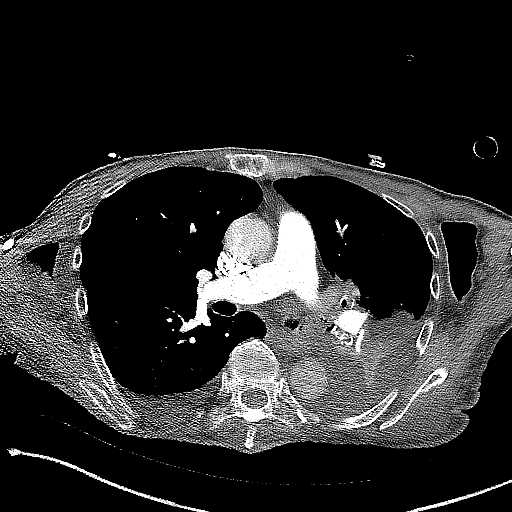

[Series 7: pe thins · axial · 0.70mm/px · z∈[+1355,+1635]mm · 15 of 458 slices shown]
[im 29/458  lung]
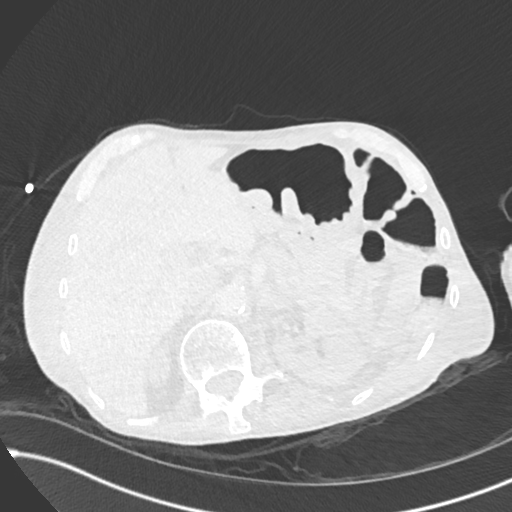
[im 58/458  mediastinal]
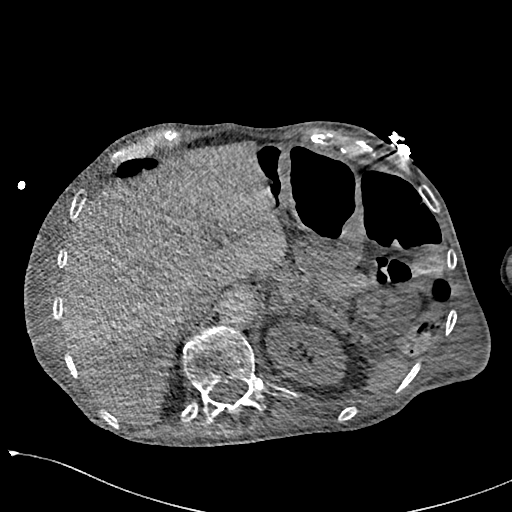
[im 86/458  lung]
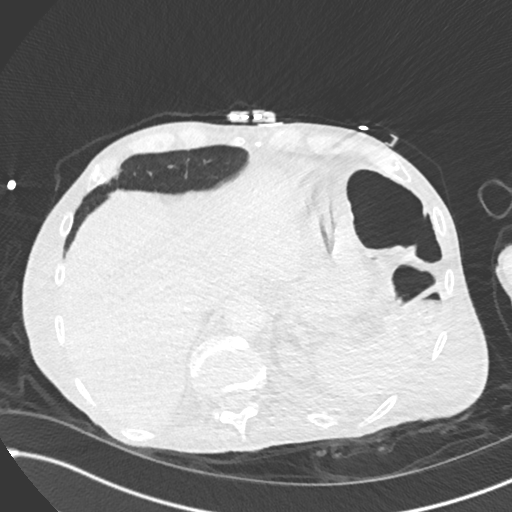
[im 115/458  mediastinal]
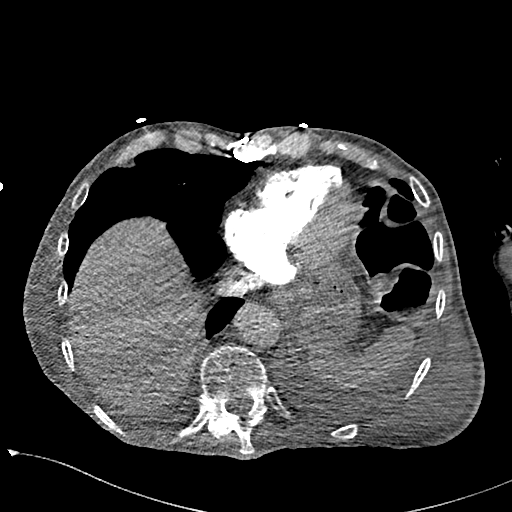
[im 143/458  lung]
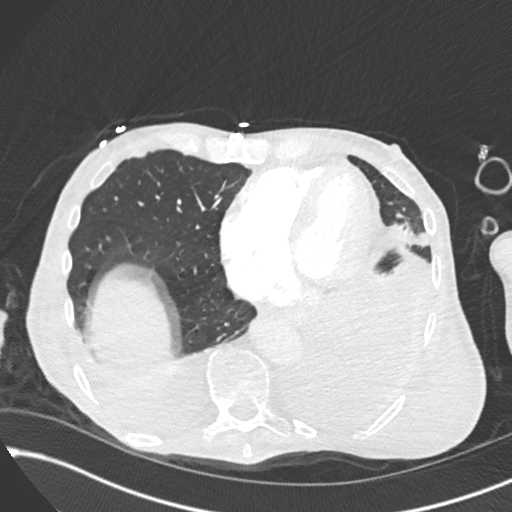
[im 172/458  mediastinal]
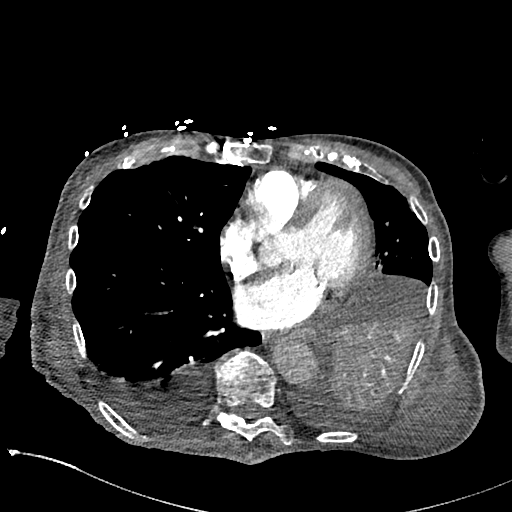
[im 200/458  lung]
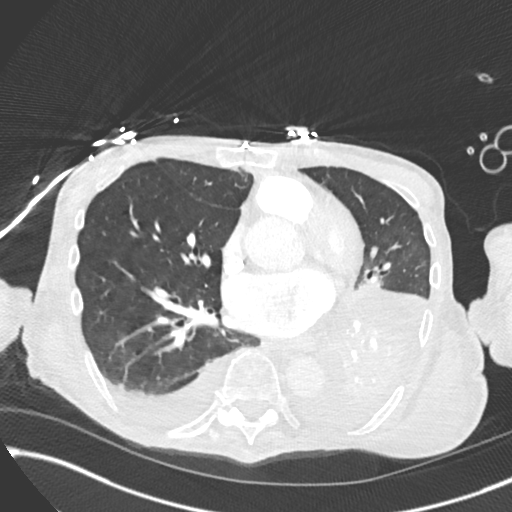
[im 229/458  mediastinal]
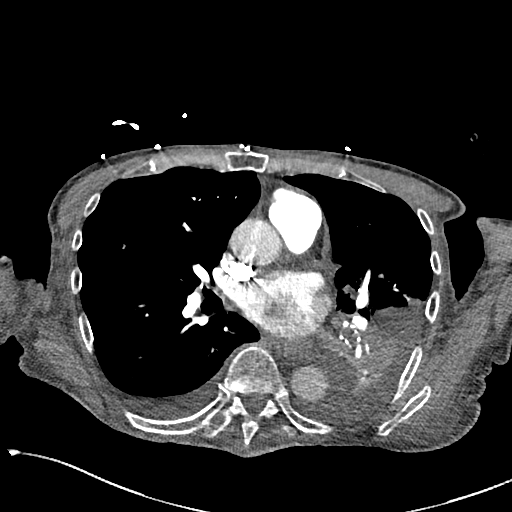
[im 258/458  lung]
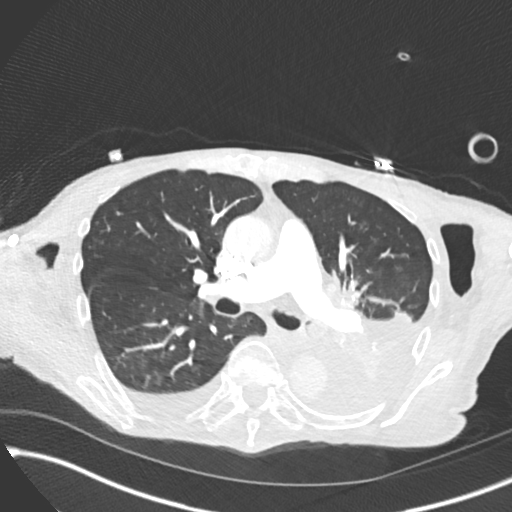
[im 286/458  mediastinal]
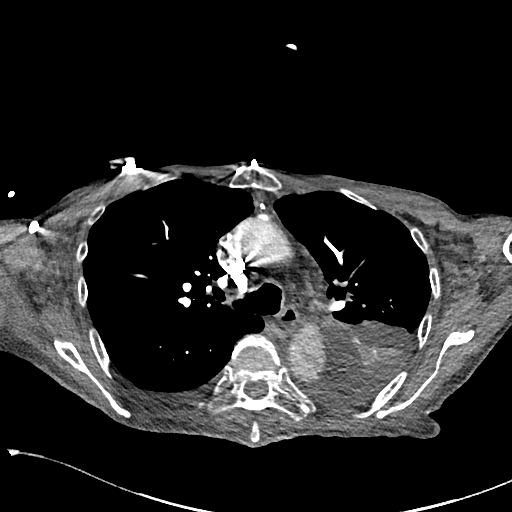
[im 315/458  lung]
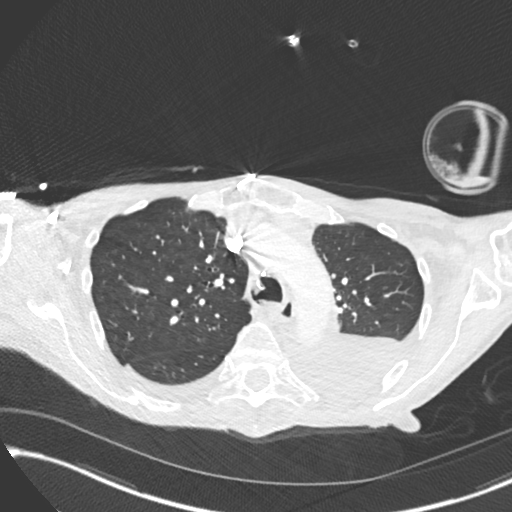
[im 343/458  mediastinal]
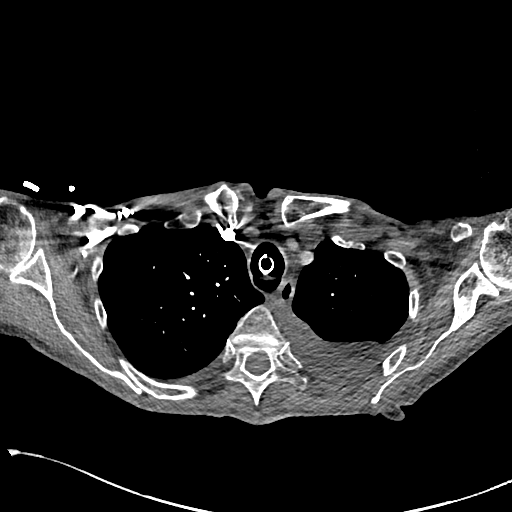
[im 372/458  lung]
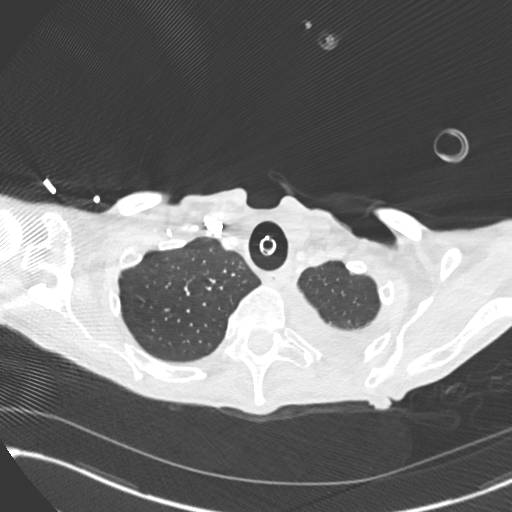
[im 400/458  mediastinal]
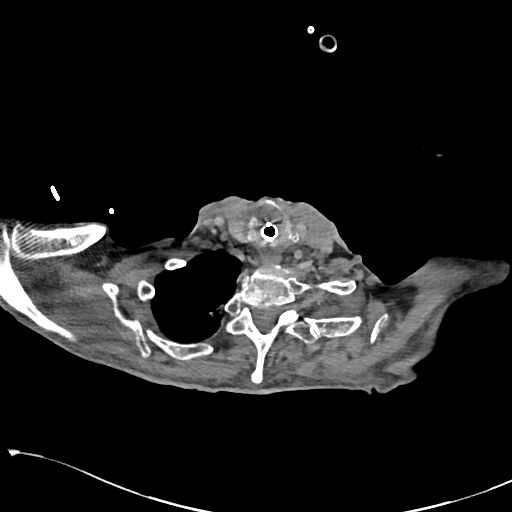
[im 429/458  lung]
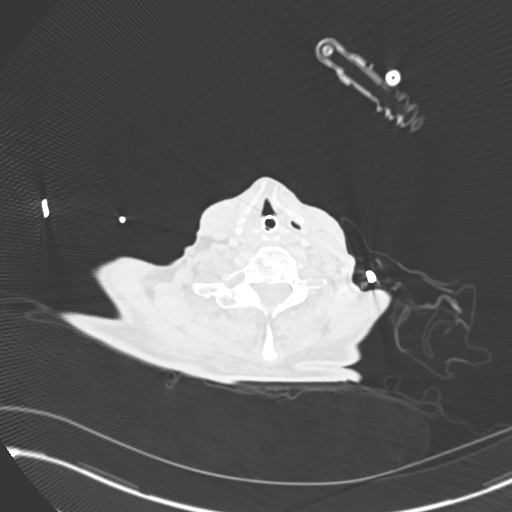

[Series 8: pe 2mm cor · coronal · 0.63mm/px · 1 of 126 slices shown]
[im 63/126  mediastinal]
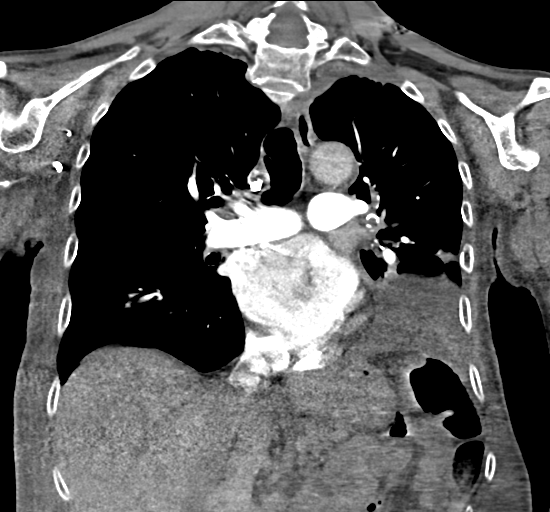

[18 of 36 positions shown; findings below may reference images not displayed]

FINDINGS: Cardiovascular: The heart is normal in size. Mild enlargement of the
left atrium. No pericardial effusion. Normal caliber thoracic aorta.
Scattered atherosclerotic calcifications. Scattered coronary artery
calcifications.

The pulmonary arterial tree is well opacified. No filling defects to
suggest pulmonary embolism.

Mediastinum/Nodes: No mediastinal or hilar mass or lymphadenopathy.
Tracheostomy tube is in good position at the mid tracheal level. The
esophagus is grossly normal.

Lungs/Pleura: Dense left lower lobe airspace consolidation with
fluid-filled bronchi and scattered calcifications. There is an
associated moderate-sized left pleural effusion. Small right pleural
effusion with overlying atelectasis.

Patchy E ill-defined nodular ground-glass attenuation in the right
lower lobe likely inflammation possible early/mild bronchopneumonia.

Upper Abdomen: No significant upper abdominal findings. Feeding
gastrostomy tube is noted stomach.

Musculoskeletal: No significant bony findings. Remote thoracic
compression fractures.

Review of the MIP images confirms the above findings.
IMPRESSION: 1. No CT findings for pulmonary embolism.
2. Normal caliber thoracic aorta.
3. Dense left lower lobe airspace consolidation with fluid-filled
bronchi, possible aspiration.
4. Moderate-sized left pleural effusion and small right pleural
effusion with overlying atelectasis.
5. Patchy ill-defined nodular ground-glass attenuation in the right
lower lobe likely inflammation possible early/mild bronchopneumonia.
6. Tracheostomy tube in good position.

## 2020-10-25 MED ORDER — IOHEXOL 350 MG/ML SOLN
50.0000 mL | Freq: Once | INTRAVENOUS | Status: AC | PRN
  Administered 2020-10-25: 50 mL via INTRAVENOUS

## 2020-10-25 NOTE — H&P (View-Only) (Signed)
PREOPERATIVE H&P  Chief Complaint: Acute on chronic respiratory failure  HPI: Austin Davis is a 80 y.o. male who resides [facility] having been admitted there on 09/29/2020.  He has history of chronic A. fib and has been on Coumadin in the past.  He has significant malnutrition and recently underwent a G-tube because of difficulty swallowing.  He has had episodes of aspiration and was ultimately intubated on 10/12/2020 secondary to ARDS.  I was consulted concerning placement of a tracheostomy last week.  I have discussed this with his daughter.  Past Medical History:  Diagnosis Date   CHF (congestive heart failure) ([place])    Coronary artery disease    GI bleed 2019   hospitalized at [facility] for one week   Headache    since childhood   Low blood sugar    since childhood, controlled by diet   Mitral regurgitation    Mitral valve prolapse    Osteopenia 2021   Paroxysmal atrial fibrillation [facility])    Past Surgical History:  Procedure Laterality Date   COLONOSCOPY WITH PROPOFOL N/A 02/19/2017   Procedure: COLONOSCOPY WITH PROPOFOL;  Surgeon: Wilford Corner, MD;  Location: [facility] ENDOSCOPY;  Service: Endoscopy;  Laterality: N/A;   IR GASTROSTOMY TUBE MOD SED  10/18/2020   laser eye surgery for retina detachment     MITRAL VALVE REPAIR  01/2003   monitor  02/05/2006   polyp removal     TONSILLECTOMY     tooth removal     as a teenager   Social History   Socioeconomic History   Marital status: Single    Spouse name: Not on file   Number of children: 2   Years of education: 16   Highest education level: Not on file  Occupational History   Occupation: [employer]  Tobacco Use   Smoking status: Never   Smokeless tobacco: Never   Tobacco comments:    only tried a few cigarettes when he was younger  Substance and Sexual Activity   Alcohol use: Yes    Comment: "probably a 6 pack of beer a year"   Drug use: No   Sexual activity: Not on file  Other  Topics Concern   Not on file  Social History Narrative   Lives with girlfriend   Caffeine use: rarely    Right handed   Social Determinants of Health   Financial Resource Strain: Not on file  Food Insecurity: Not on file  Transportation Needs: Not on file  Physical Activity: Not on file  Stress: Not on file  Social Connections: Not on file   Family History  Problem Relation Age of Onset   Heart disease Mother    CAD Mother    Migraines Mother    Heart failure Father    Skin cancer Father    Valvular heart disease Father        prolapsed valve   Tremor Father        "probably had a bit of tremor in his old age"   Heart failure Maternal Grandmother    Pneumonia Maternal Grandfather    Heart failure Paternal Grandmother    Stroke Paternal Grandfather    Asthma Daughter    Epilepsy Son    Parkinson's disease Neg Hx    Allergies  Allergen Reactions   Prednisone Other (See Comments)    Makes skin crawl, rapid HR   Cortisone Palpitations   Prior to Admission medications   Medication  Sig Start Date End Date Taking? Authorizing Provider  acetaminophen (TYLENOL) 500 MG tablet Take 500 mg by mouth every 6 (six) hours as needed for mild pain or headache.    [provider]  ALPRAZolam Duanne Moron) 0.25 MG tablet Take 0.125 mg by mouth at bedtime as needed for anxiety.    [provider]  Ascorbic Acid (VITAMIN C IMMUNE HEALTH PO) Take 500 mg by mouth.    [provider]  B Complex Vitamins (VITAMIN B COMPLEX PO) Take 1 tablet by mouth daily.    [provider]  Cholecalciferol (D3) 50 MCG (2000 UT) TABS Take 2,000 Units by mouth daily.    [provider]  diltiazem (CARDIZEM) 30 MG tablet Take 1 tablet (30 mg total) by mouth every 12 (twelve) hours. 09/28/20   [name], MD  Ensure Max Protein (ENSURE MAX PROTEIN) LIQD Take 330 mLs (11 oz total) by mouth daily. 09/28/20 10/28/20  [name], MD  feeding supplement (ENSURE  ENLIVE / ENSURE PLUS) LIQD Take 237 mLs by mouth 2 (two) times daily between meals. 09/28/20   [name], MD  folic acid (FOLVITE) 1 MG tablet Take 1 tablet (1 mg total) by mouth daily. 09/29/20   [name], MD  Magnesium 250 MG TABS Take 250 mg by mouth daily.    [provider]  midodrine (PROAMATINE) 5 MG tablet Take 1 tablet (5 mg total) by mouth 3 (three) times daily with meals. 09/28/20   [name], MD  mirtazapine (REMERON) 7.5 MG tablet Take 1 tablet (7.5 mg total) by mouth at bedtime. 09/28/20   [name], MD  Multiple Vitamin (MULTIVITAMIN WITH MINERALS) TABS tablet Take 1 tablet by mouth daily. 09/29/20   [name], MD  Omega-3 Fatty Acids (FISH OIL PO) Take 1 capsule by mouth daily.    [provider]  pantoprazole (PROTONIX) 40 MG tablet Take 1 tablet (40 mg total) by mouth 2 (two) times daily. 09/28/20   [name], MD  senna-docusate (SENOKOT-S) 8.6-50 MG tablet Take 1 tablet by mouth 2 (two) times daily. 09/28/20   [name], MD  warfarin (COUMADIN) 1 MG tablet Take 1 tablet (1 mg total) by mouth daily. Note do not take warfarin 9/10 or 9/11 and call warfarin clinical for plan on 9/12 09/29/20   [name], MD  warfarin (COUMADIN) 5 MG tablet Note, do not take warfarin 9/10 or 9/11 and call warfarin clinic for plan on 9/12. TAKE 1/2 TO 1 TABLET DAILY AS DIRECTED BY COUMADIN CLINIC. 09/29/20   [name], MD     Positive ROS: Otherwise negative  All other systems have been reviewed and were otherwise negative with the exception of those mentioned in the HPI and as above.  Physical Exam: There were no vitals filed for this visit.  General: Patient is intubated and on ventilator. Nasal: Clear nasal passages Neck: No palpable adenopathy or thyroid nodules.  Patient with a thin neck with trachea palpably midline. Cardiovascular: Irregular rate and rhythm Respiratory: Patient  intubated on ventilator   Assessment/Plan: Acute on chronic respiratory failure.  Has been intubated for 2 weeks.  Plan for tracheostomy.   [name], MD 10/25/2020 4:54 PM

## 2020-10-25 NOTE — H&P (Signed)
PREOPERATIVE H&P  Chief Complaint: Acute on chronic respiratory failure  HPI: Austin Davis is a 80 y.o. male who resides at [facility] having been admitted there on 09/29/2020.  He has history of chronic A. fib and has been on Coumadin in the past.  He has significant malnutrition and recently underwent a G-tube because of difficulty swallowing.  He has had episodes of aspiration and was ultimately intubated on 10/12/2020 secondary to ARDS.  I was consulted concerning placement of a tracheostomy last week.  I have discussed this with his daughter.  Past Medical History:  Diagnosis Date   CHF (congestive heart failure) ([place])    Coronary artery disease    GI bleed 2019   hospitalized at [facility] for one week   Headache    since childhood   Low blood sugar    since childhood, controlled by diet   Mitral regurgitation    Mitral valve prolapse    Osteopenia 2021   Paroxysmal atrial fibrillation [facility])    Past Surgical History:  Procedure Laterality Date   COLONOSCOPY WITH PROPOFOL N/A 02/19/2017   Procedure: COLONOSCOPY WITH PROPOFOL;  Surgeon: Wilford Corner, MD;  Location: [facility];  Service: Endoscopy;  Laterality: N/A;   IR GASTROSTOMY TUBE MOD SED  10/18/2020   laser eye surgery for retina detachment     MITRAL VALVE REPAIR  01/2003   monitor  02/05/2006   polyp removal     TONSILLECTOMY     tooth removal     as a teenager   Social History   Socioeconomic History   Marital status: Single    Spouse name: Not on file   Number of children: 2   Years of education: 16   Highest education level: Not on file  Occupational History   Occupation: [employer]  Tobacco Use   Smoking status: Never   Smokeless tobacco: Never   Tobacco comments:    only tried a few cigarettes when he was younger  Substance and Sexual Activity   Alcohol use: Yes    Comment: "probably a 6 pack of beer a year"   Drug use: No   Sexual activity: Not on file  Other  Topics Concern   Not on file  Social History Narrative   Lives with girlfriend   Caffeine use: rarely    Right handed   Social Determinants of Health   Financial Resource Strain: Not on file  Food Insecurity: Not on file  Transportation Needs: Not on file  Physical Activity: Not on file  Stress: Not on file  Social Connections: Not on file   Family History  Problem Relation Age of Onset   Heart disease Mother    CAD Mother    Migraines Mother    Heart failure Father    Skin cancer Father    Valvular heart disease Father        prolapsed valve   Tremor Father        "probably had a bit of tremor in his old age"   Heart failure Maternal Grandmother    Pneumonia Maternal Grandfather    Heart failure Paternal Grandmother    Stroke Paternal Grandfather    Asthma Daughter    Epilepsy Son    Parkinson's disease Neg Hx    Allergies  Allergen Reactions   Prednisone Other (See Comments)    Makes skin crawl, rapid HR   Cortisone Palpitations   Prior to Admission medications   Medication  Sig Start Date End Date Taking? Authorizing Provider  acetaminophen (TYLENOL) 500 MG tablet Take 500 mg by mouth every 6 (six) hours as needed for mild pain or headache.    [provider]  ALPRAZolam Duanne Moron) 0.25 MG tablet Take 0.125 mg by mouth at bedtime as needed for anxiety.    [provider]  Ascorbic Acid (VITAMIN C IMMUNE HEALTH PO) Take 500 mg by mouth.    [provider]  B Complex Vitamins (VITAMIN B COMPLEX PO) Take 1 tablet by mouth daily.    [provider]  Cholecalciferol (D3) 50 MCG (2000 UT) TABS Take 2,000 Units by mouth daily.    [provider]  diltiazem (CARDIZEM) 30 MG tablet Take 1 tablet (30 mg total) by mouth every 12 (twelve) hours. 09/28/20   [name], MD  Ensure Max Protein (ENSURE MAX PROTEIN) LIQD Take 330 mLs (11 oz total) by mouth daily. 09/28/20 10/28/20  [name], MD  feeding supplement (ENSURE  ENLIVE / ENSURE PLUS) LIQD Take 237 mLs by mouth 2 (two) times daily between meals. 09/28/20   [name], MD  folic acid (FOLVITE) 1 MG tablet Take 1 tablet (1 mg total) by mouth daily. 09/29/20   [name], MD  Magnesium 250 MG TABS Take 250 mg by mouth daily.    [provider]  midodrine (PROAMATINE) 5 MG tablet Take 1 tablet (5 mg total) by mouth 3 (three) times daily with meals. 09/28/20   [name], MD  mirtazapine (REMERON) 7.5 MG tablet Take 1 tablet (7.5 mg total) by mouth at bedtime. 09/28/20   [name], MD  Multiple Vitamin (MULTIVITAMIN WITH MINERALS) TABS tablet Take 1 tablet by mouth daily. 09/29/20   [name], MD  Omega-3 Fatty Acids (FISH OIL PO) Take 1 capsule by mouth daily.    [provider]  pantoprazole (PROTONIX) 40 MG tablet Take 1 tablet (40 mg total) by mouth 2 (two) times daily. 09/28/20   [name], MD  senna-docusate (SENOKOT-S) 8.6-50 MG tablet Take 1 tablet by mouth 2 (two) times daily. 09/28/20   [name], MD  warfarin (COUMADIN) 1 MG tablet Take 1 tablet (1 mg total) by mouth daily. Note do not take warfarin 9/10 or 9/11 and call warfarin clinical for plan on 9/12 09/29/20   [name], MD  warfarin (COUMADIN) 5 MG tablet Note, do not take warfarin 9/10 or 9/11 and call warfarin clinic for plan on 9/12. TAKE 1/2 TO 1 TABLET DAILY AS DIRECTED BY COUMADIN CLINIC. 09/29/20   [name], MD     Positive ROS: Otherwise negative  All other systems have been reviewed and were otherwise negative with the exception of those mentioned in the HPI and as above.  Physical Exam: There were no vitals filed for this visit.  General: Patient is intubated and on ventilator. Nasal: Clear nasal passages Neck: No palpable adenopathy or thyroid nodules.  Patient with a thin neck with trachea palpably midline. Cardiovascular: Irregular rate and rhythm Respiratory: Patient  intubated on ventilator   Assessment/Plan: Acute on chronic respiratory failure.  Has been intubated for 2 weeks.  Plan for tracheostomy.   [name], MD 10/25/2020 4:54 PM

## 2020-10-25 NOTE — Progress Notes (Signed)
Right upper extremity venous duplex has been completed. Preliminary results can be found in CV Proc through chart review.   10/25/20 11:41 AM Austin Davis RVT

## 2020-10-25 NOTE — Progress Notes (Signed)
Pulmonary Critical Care Medicine [place]  PROGRESS NOTE     Austin Davis  AVW:[id number]  DOB: February 28, 1940   DOA: 09/29/2020  Referring Physician: Satira Sark, MD  HPI: Austin Davis is a 80 y.o. male being followed for ventilator/airway/oxygen weaning Acute on Chronic Respiratory Failure.  Patient is low-grade fever seems to be comfortable right now without distress at this time.  Has been having some issues with bleeding being worked up apparently is scheduled for CT angiogram also to make certain there is no pulmonary embolism.  Medications: Reviewed on Rounds  Physical Exam:  Vitals: Temperature is 99.3 pulse 74 respiratory is 18 blood pressure is 127/68 saturations 98%  Ventilator Settings on pressure support FiO2 35% pressure 12/5  General: Comfortable at this time Neck: supple Cardiovascular: no malignant arrhythmias Respiratory: Scattered rhonchi noted bilaterally Skin: no rash seen on limited exam Musculoskeletal: No gross abnormality Psychiatric:unable to assess Neurologic:no involuntary movements         Lab Data:   Basic Metabolic Panel: Recent Labs  Lab 10/20/20 0658 10/21/20 0607 10/22/20 0341 10/24/20 0521 10/25/20 0309  NA 132* 134* 134* 131* 134*  K 3.9 3.6 3.5 4.1 4.2  CL 96* 96* 97* 93* 96*  CO2 31 29 30  32 32  GLUCOSE 143* 173* 113* 112* 114*  BUN 18 19 16 15 13   CREATININE 0.43* 0.40* 0.32* 0.41* 0.40*  CALCIUM 7.8* 7.6* 7.6* 7.8* 7.9*  MG 2.1 2.0 2.0 2.1  --   PHOS 2.0* 2.3* 2.4*  --  2.8    ABG: No results for input(s): PHART, PCO2ART, PO2ART, HCO3, O2SAT in the last 168 hours.  Liver Function Tests: Recent Labs  Lab 10/25/20 0309  ALBUMIN 1.7*   No results for input(s): LIPASE, AMYLASE in the last 168 hours. No results for input(s): AMMONIA in the last 168 hours.  CBC: Recent Labs  Lab 10/19/20 1640 10/21/20 0607 10/24/20 0521 10/25/20 0309  WBC 8.3  9.3 9.9 8.2  HGB 10.1* 9.5* 8.9* 8.3*  HCT 31.5* 29.2* 26.5* 25.6*  MCV 100.6* 100.0 100.0 102.0*  PLT 300 314 303 266    Cardiac Enzymes: No results for input(s): CKTOTAL, CKMB, CKMBINDEX, TROPONINI in the last 168 hours.  BNP (last 3 results) Recent Labs    09/19/20 1606  BNP 876.9*    ProBNP (last 3 results) No results for input(s): PROBNP in the last 8760 hours.  Radiological Exams: No results found.  Assessment/Plan Active Problems:   Acute on chronic respiratory failure with hypoxia and hypercapnia (HCC)   AF (paroxysmal atrial fibrillation) (HCC)   Obstructive sleep apnea   COPD, severe (HCC)   Chronic heart failure with preserved ejection fraction (HCC)   Acute on chronic respiratory failure hypoxia plan is for tracheostomy as scheduled will try to get this done tomorrow if no other issues come up. Atrial fibrillation right now rate is controlled we will continue to monitor closely. Obstructive sleep apnea patient is at baseline Severe COPD medical management Chronic heart failure at baseline   I have personally seen and evaluated the patient, evaluated laboratory and imaging results, formulated the assessment and plan and placed orders. The Patient requires high complexity decision making with multiple systems involvement.  Rounds were done with the Respiratory Therapy Director and Staff therapists and discussed with nursing staff also.  Allyne Gee, MD [facility] Pulmonary Critical Care Medicine Sleep Medicine

## 2020-10-26 ENCOUNTER — Inpatient Hospital Stay: Admit: 2020-10-26 | Payer: Medicare Other | Admitting: Otolaryngology

## 2020-10-26 ENCOUNTER — Encounter
Admission: EM | Disposition: A | Discharge status: Skilled Nursing Facility | Payer: Self-pay | Source: Other Acute Inpatient Hospital | Attending: Internal Medicine

## 2020-10-26 ENCOUNTER — Encounter (HOSPITAL_COMMUNITY): Payer: Medicare Other | Admitting: Anesthesiology

## 2020-10-26 DIAGNOSIS — J9620 Acute and chronic respiratory failure, unspecified whether with hypoxia or hypercapnia: Secondary | ICD-10-CM

## 2020-10-26 HISTORY — PX: TRACHEOSTOMY TUBE PLACEMENT: SHX814

## 2020-10-26 LAB — CBC
HCT: 27.6 % — ABNORMAL LOW (ref 39.0–52.0)
Hemoglobin: 9 g/dL — ABNORMAL LOW (ref 13.0–17.0)
MCH: 33.6 pg (ref 26.0–34.0)
MCHC: 32.6 g/dL (ref 30.0–36.0)
MCV: 103 fL — ABNORMAL HIGH (ref 80.0–100.0)
Platelets: 263 10*3/uL (ref 150–400)
RBC: 2.68 MIL/uL — ABNORMAL LOW (ref 4.22–5.81)
RDW: 16.5 % — ABNORMAL HIGH (ref 11.5–15.5)
WBC: 7.8 10*3/uL (ref 4.0–10.5)
nRBC: 0.3 % — ABNORMAL HIGH (ref 0.0–0.2)

## 2020-10-26 LAB — HEPARIN LEVEL (UNFRACTIONATED)
Heparin Unfractionated: <0.1 IU/mL — ABNORMAL LOW (ref 0.30–0.70)
Heparin Unfractionated: <0.1 IU/mL — ABNORMAL LOW (ref 0.30–0.70)

## 2020-10-26 LAB — PROTIME-INR
INR: 1 (ref 0.8–1.2)
Prothrombin Time: 13.2 seconds (ref 11.4–15.2)

## 2020-10-26 LAB — APTT: aPTT: 24 seconds (ref 24–36)

## 2020-10-26 SURGERY — CREATION, TRACHEOSTOMY
Anesthesia: General | Site: Neck

## 2020-10-26 SURGERY — CREATION, TRACHEOSTOMY
Anesthesia: General

## 2020-10-26 MED ORDER — FENTANYL CITRATE (PF) 250 MCG/5ML IJ SOLN
INTRAMUSCULAR | Status: AC
  Filled 2020-10-26: qty 5

## 2020-10-26 MED ORDER — EPHEDRINE SULFATE 50 MG/ML IJ SOLN
INTRAMUSCULAR | Status: DC | PRN
  Administered 2020-10-26: 5 mg via INTRAVENOUS

## 2020-10-26 MED ORDER — LIDOCAINE-EPINEPHRINE 1 %-1:100000 IJ SOLN
INTRAMUSCULAR | Status: AC
  Filled 2020-10-26: qty 1

## 2020-10-26 MED ORDER — PHENYLEPHRINE HCL (PRESSORS) 10 MG/ML IV SOLN
INTRAVENOUS | Status: DC | PRN
  Administered 2020-10-26 (×2): 40 ug via INTRAVENOUS

## 2020-10-26 MED ORDER — ROCURONIUM BROMIDE 10 MG/ML (PF) SYRINGE
PREFILLED_SYRINGE | INTRAVENOUS | Status: DC | PRN
  Administered 2020-10-26: 30 mg via INTRAVENOUS

## 2020-10-26 MED ORDER — PROPOFOL 10 MG/ML IV BOLUS
INTRAVENOUS | Status: AC
  Filled 2020-10-26: qty 20

## 2020-10-26 MED ORDER — MIDAZOLAM HCL 2 MG/2ML IJ SOLN
INTRAMUSCULAR | Status: AC
  Filled 2020-10-26: qty 2

## 2020-10-26 MED ORDER — ALBUMIN HUMAN 5 % IV SOLN: INTRAVENOUS | Status: DC | PRN

## 2020-10-26 MED ORDER — LACTATED RINGERS IV SOLN: INTRAVENOUS | Status: DC | PRN

## 2020-10-26 MED ORDER — MIDAZOLAM HCL 5 MG/5ML IJ SOLN
INTRAMUSCULAR | Status: DC | PRN
  Administered 2020-10-26: 1 mg via INTRAVENOUS

## 2020-10-26 MED ORDER — SUGAMMADEX SODIUM 200 MG/2ML IV SOLN
INTRAVENOUS | Status: DC | PRN
  Administered 2020-10-26: 200 mg via INTRAVENOUS

## 2020-10-26 MED ORDER — LIDOCAINE-EPINEPHRINE 1 %-1:100000 IJ SOLN
INTRAMUSCULAR | Status: DC | PRN
  Administered 2020-10-26: 7 mL

## 2020-10-26 SURGICAL SUPPLY — 41 items
ATTRACTOMAT 16X20 MAGNETIC DRP (DRAPES) IMPLANT
BAG COUNTER SPONGE SURGICOUNT (BAG) ×2 IMPLANT
BAG SPNG CNTER NS LX DISP (BAG) ×1
BLADE SURG 15 STRL LF DISP TIS (BLADE) ×1 IMPLANT
BLADE SURG 15 STRL SS (BLADE) ×2
CLEANER TIP ELECTROSURG 2X2 (MISCELLANEOUS) ×2 IMPLANT
COVER SURGICAL LIGHT HANDLE (MISCELLANEOUS) ×2 IMPLANT
DRAPE HALF SHEET 40X57 (DRAPES) IMPLANT
ELECT COATED BLADE 2.86 ST (ELECTRODE) ×2 IMPLANT
ELECT REM PT RETURN 9FT ADLT (ELECTROSURGICAL) ×2
ELECTRODE REM PT RTRN 9FT ADLT (ELECTROSURGICAL) ×1 IMPLANT
GAUZE 4X4 16PLY ~~LOC~~+RFID DBL (SPONGE) ×2 IMPLANT
GEL ULTRASOUND 20GR AQUASONIC (MISCELLANEOUS) ×2 IMPLANT
GLOVE SURG MICRO LTX SZ7.5 (GLOVE) ×2 IMPLANT
GOWN STRL REUS W/ TWL LRG LVL3 (GOWN DISPOSABLE) ×1 IMPLANT
GOWN STRL REUS W/ TWL XL LVL3 (GOWN DISPOSABLE) ×1 IMPLANT
GOWN STRL REUS W/TWL LRG LVL3 (GOWN DISPOSABLE) ×4
GOWN STRL REUS W/TWL XL LVL3 (GOWN DISPOSABLE) ×2
HOLDER TRACH TUBE VELCRO 19.5 (MISCELLANEOUS) ×2 IMPLANT
KIT BASIN OR (CUSTOM PROCEDURE TRAY) ×2 IMPLANT
KIT SUCTION CATH 14FR (SUCTIONS) ×1 IMPLANT
KIT TURNOVER KIT B (KITS) ×2 IMPLANT
NDL HYPO 25GX1X1/2 BEV (NEEDLE) ×1 IMPLANT
NEEDLE HYPO 25GX1X1/2 BEV (NEEDLE) ×2 IMPLANT
NS IRRIG 1000ML POUR BTL (IV SOLUTION) ×2 IMPLANT
PACK EENT II TURBAN DRAPE (CUSTOM PROCEDURE TRAY) ×2 IMPLANT
PAD ARMBOARD 7.5X6 YLW CONV (MISCELLANEOUS) ×1 IMPLANT
PENCIL BUTTON HOLSTER BLD 10FT (ELECTRODE) ×2 IMPLANT
SPONGE DRAIN TRACH 4X4 STRL 2S (GAUZE/BANDAGES/DRESSINGS) ×2 IMPLANT
SPONGE INTESTINAL PEANUT (DISPOSABLE) ×2 IMPLANT
SUT SILK 2 0 SH CR/8 (SUTURE) ×2 IMPLANT
SUT SILK 3 0 TIES 10X30 (SUTURE) IMPLANT
SYR 5ML LUER SLIP (SYRINGE) ×2 IMPLANT
SYR CONTROL 10ML LL (SYRINGE) ×2 IMPLANT
TOWEL GREEN STERILE (TOWEL DISPOSABLE) ×1 IMPLANT
TOWEL GREEN STERILE FF (TOWEL DISPOSABLE) ×2 IMPLANT
TUBE CONNECTING 12X1/4 (SUCTIONS) ×2 IMPLANT
TUBE TRACH  6.0 CUFF FLEX (MISCELLANEOUS) ×2
TUBE TRACH 6.0 CUFF FLEX (MISCELLANEOUS) IMPLANT
TUBE TRACH SHILEY  6 DIST  CUF (TUBING) IMPLANT
TUBE TRACH SHILEY 8 DIST CUF (TUBING) IMPLANT

## 2020-10-26 NOTE — Brief Op Note (Signed)
10/26/2020  10:32 AM  PATIENT:  Austin Davis  80 y.o. male  PRE-OPERATIVE DIAGNOSIS:  Respiratory Failure  POST-OPERATIVE DIAGNOSIS:  Respiratory Failure  PROCEDURE:  Procedure(s): TRACHEOSTOMY (N/A)  #6 Cuffed [place]  SURGEON:  Surgeon(s) and Role:    Rozetta Nunnery, MD - Primary  PHYSICIAN ASSISTANT:   ASSISTANTS: none   ANESTHESIA:   general  EBL:  minimal   BLOOD ADMINISTERED:none  DRAINS: none   LOCAL MEDICATIONS USED:  XYLOCAINE   SPECIMEN:  No Specimen  DISPOSITION OF SPECIMEN:  N/A  COUNTS:  YES  TOURNIQUET:  * No tourniquets in log *  DICTATION: .Other Dictation: Dictation Number [id number]  PLAN OF CARE: Discharge to home after PACU  PATIENT DISPOSITION:  PACU - hemodynamically stable.   Delay start of Pharmacological VTE agent (>24hrs) due to surgical blood loss or risk of bleeding: yes

## 2020-10-26 NOTE — Anesthesia Preprocedure Evaluation (Signed)
Anesthesia Evaluation  Patient identified by MRN, date of birth, ID band Patient unresponsive    Reviewed: Allergy & Precautions, NPO status , Patient's Chart, lab work & pertinent test results  Airway Mallampati: Intubated       Dental   Unable to assess:   Pulmonary sleep apnea , COPD,     + decreased breath sounds  + intubated    Cardiovascular + CAD, +CHF and + DOE  + dysrhythmias Atrial Fibrillation + Valvular Problems/Murmurs (Status post mitral valve annuloplasty and MAZE 2005)  Rhythm:Irregular Rate:Normal  TTE 2022 1. Left ventricular ejection fraction, by estimation, is 45 to 50%. The  left ventricle has mildly decreased function. The left ventricle  demonstrates regional wall motion abnormalities with mid to apical  inferoseptal hypokinesis, inferior hypokinesis,  and inferolateral hypokinesis. There is mild left ventricular hypertrophy.  Left ventricular diastolic parameters are consistent with Grade I  diastolic dysfunction (impaired relaxation).  2. Right ventricular systolic function is mildly reduced. The right  ventricular size is normal. Tricuspid regurgitation signal is inadequate  for assessing PA pressure.  3. Left atrial size was mildly dilated.  4. Status post mitral valve repair. Mean gradient 3 mmHg, no signicant  stenosis. No significant regurgitation noted.  5. The aortic valve is tricuspid. Aortic valve regurgitation is trivial.  Mild aortic valve sclerosis is present, with no evidence of aortic valve  stenosis.  6. Aortic dilatation noted. There is mild dilatation of the ascending  aorta, measuring 41 mm.  7. Technically difficult study with poor acoustic windows.   Neuro/Psych  Headaches, negative psych ROS   GI/Hepatic negative GI ROS, Neg liver ROS,   Endo/Other  negative endocrine ROS  Renal/GU negative Renal ROS  negative genitourinary   Musculoskeletal negative musculoskeletal  ROS (+)   Abdominal   Peds  Hematology  (+) Blood dyscrasia (on coumadin), anemia , Lab Results      Component                Value               Date                      WBC                      8.2                 10/25/2020                HGB                      8.3 (L)             10/25/2020                HCT                      25.6 (L)            10/25/2020                MCV                      102.0 (H)           10/25/2020                PLT  266                 10/25/2020              Anesthesia Other Findings episodes of aspiration and was ultimately intubated on 10/12/2020 secondary to ARDS  Reproductive/Obstetrics                             Anesthesia Physical Anesthesia Plan  ASA: 4  Anesthesia Plan: General   Post-op Pain Management:    Induction: Inhalational  PONV Risk Score and Plan: 2 and Treatment may vary due to age or medical condition  Airway Management Planned: Tracheostomy and Oral ETT  Additional Equipment:   Intra-op Plan:   Post-operative Plan: Post-operative intubation/ventilation  Informed Consent: I have reviewed the patients History and Physical, chart, labs and discussed the procedure including the risks, benefits and alternatives for the proposed anesthesia with the patient or authorized representative who has indicated his/her understanding and acceptance.     Dental advisory given  Plan Discussed with: CRNA  Anesthesia Plan Comments:         Anesthesia Quick Evaluation

## 2020-10-26 NOTE — Interval H&P Note (Signed)
History and Physical Interval Note:  10/26/2020 9:25 AM  Austin Davis  has presented today for surgery, with the diagnosis of Respiratory Failure.  The various methods of treatment have been discussed with the patient and family. After consideration of risks, benefits and other options for treatment, the patient has consented to  Procedure(s): TRACHEOSTOMY (N/A) as a surgical intervention.  The patient's history has been reviewed, patient examined, no change in status, stable for surgery.  I have reviewed the patient's chart and labs.  Questions were answered to the patient's satisfaction.     [name]

## 2020-10-26 NOTE — Transfer of Care (Addendum)
Immediate Anesthesia Transfer of Care Note  Patient: Austin Davis  Procedure(s) Performed: TRACHEOSTOMY (Neck)  Patient Location: ICU  Anesthesia Type:General  Level of Consciousness: sedated  Airway & Oxygen Therapy: Patient remains intubated per anesthesia plan  Post-op Assessment: Report given to RN and Post -op Vital signs reviewed and stable  Post vital signs: stable  Last Vitals:  Vitals Value Taken Time  BP 106/63   Temp    Pulse 70   Resp 12/vent   SpO2 97     Last Pain:  Vitals:   10/18/20 0935  PainSc: Asleep         Complications: No notable events documented.

## 2020-10-26 NOTE — Anesthesia Postprocedure Evaluation (Signed)
Anesthesia Post Note  Patient: Austin Davis  Procedure(s) Performed: TRACHEOSTOMY (Neck)     Anesthesia Type: General Anesthetic complications: no   No notable events documented.  Last Vitals:  Vitals:   10/18/20 0930 10/18/20 0935  BP: (!) 92/57 93/67  Pulse: 61 63  Resp: 14 17  SpO2: 96% 98%    Last Pain:  Vitals:   10/18/20 0935  PainSc: Asleep                 Yehuda Mao

## 2020-10-26 NOTE — Anesthesia Postprocedure Evaluation (Signed)
Anesthesia Post Note  Patient: Austin Davis  Procedure(s) Performed: TRACHEOSTOMY (Neck)     Patient location during evaluation: Other [facility]) Anesthesia Type: General Level of consciousness: sedated Pain management: pain level controlled Vital Signs Assessment: post-procedure vital signs reviewed and stable Respiratory status: patient on ventilator - see flowsheet for VS (tracheostomy) Cardiovascular status: blood pressure returned to baseline Postop Assessment: no headache, no backache and no apparent nausea or vomiting Anesthetic complications: no   No notable events documented.  Last Vitals:  Vitals:   10/18/20 0930 10/18/20 0935  BP: (!) 92/57 93/67  Pulse: 61 63  Resp: 14 17  SpO2: 96% 98%    Last Pain:  Vitals:   10/18/20 0935  PainSc: [place]

## 2020-10-26 NOTE — Op Note (Signed)
NAMECASSIUS, Austin Davis MEDICAL RECORD NO: [id number] ACCOUNT NO: [id number] DATE OF BIRTH: 27-Oct-1940 FACILITY: [facility] LOCATION: [facility] PHYSICIAN: Leonides Sake. Lucia Gaskins, MD  Operative Report   PREOPERATIVE DIAGNOSIS:  Acute on chronic respiratory failure.  POSTOPERATIVE DIAGNOSIS:  Acute on chronic respiratory failure.  OPERATION PERFORMED:  Tracheostomy with a #6 Shiley cuffed tube.  SURGEON:  Dr. [name].  ANESTHESIA:  General endotracheal.  ESTIMATED BLOOD LOSS:  Minimal.  COMPLICATIONS:  None.  BRIEF CLINICAL NOTE:  The patient is a 80 year old gentleman who resides at [facility].  He has had chronic respiratory failure and he has had problems with aspiration and recent ARDS requiring intubation about 2 weeks ago.  They have been  unable to extubate him and tracheostomy was recommended and discussed with the family.  He was taken to the operating room at this time for tracheostomy.  DESCRIPTION OF PROCEDURE:  The patient was brought straight down from [facility], intubated on the ventilator.  He remained in his bed.  A shoulder roll was placed under his neck to extend his neck.  The cricoid and trachea was easily  palpated, marked out and injected with 6 mL of Xylocaine with epinephrine for hemostasis. The area was prepped with Betadine solution and draped with sterile towels.  A vertical incision was made just below the cricoid cartilage.  The dissection was  carried down through subcutaneous tissue with cautery.  The strap muscles were divided in the midline and retracted laterally.  The thyroid isthmus was identified overlying the second and third tracheal rings and divided with cautery.  First, three  tracheal rings were exposed and a horizontal tracheotomy was performed between the first and second tracheal rings.  The endotracheal tube was withdrawn and a #6 Shiley cuffed tube was inserted.  The patient was ventilated well.  This was  secured to the  neck with 2-0 silk sutures x 4 and Velcro trach collar around the neck.  The patient was transferred back to [facility] D: 10/26/2020 10:37:22 am T: 10/26/2020 11:50:00 pm  JOB: [id number]/ [id number]

## 2020-10-27 ENCOUNTER — Other Ambulatory Visit (HOSPITAL_COMMUNITY): Payer: Medicare Other

## 2020-10-27 LAB — CBC
HCT: 27.5 % — ABNORMAL LOW (ref 39.0–52.0)
Hemoglobin: 9.2 g/dL — ABNORMAL LOW (ref 13.0–17.0)
MCH: 33.8 pg (ref 26.0–34.0)
MCHC: 33.5 g/dL (ref 30.0–36.0)
MCV: 101.1 fL — ABNORMAL HIGH (ref 80.0–100.0)
Platelets: 278 10*3/uL (ref 150–400)
RBC: 2.72 MIL/uL — ABNORMAL LOW (ref 4.22–5.81)
RDW: 16.7 % — ABNORMAL HIGH (ref 11.5–15.5)
WBC: 8.1 10*3/uL (ref 4.0–10.5)
nRBC: 0.2 % (ref 0.0–0.2)

## 2020-10-27 LAB — BASIC METABOLIC PANEL
Anion gap: 8 (ref 5–15)
BUN: 18 mg/dL (ref 8–23)
CO2: 32 mmol/L (ref 22–32)
Calcium: 8.2 mg/dL — ABNORMAL LOW (ref 8.9–10.3)
Chloride: 94 mmol/L — ABNORMAL LOW (ref 98–111)
Creatinine, Ser: 0.39 mg/dL — ABNORMAL LOW (ref 0.61–1.24)
GFR, Estimated: >60 mL/min (ref >60)
Glucose, Bld: 119 mg/dL — ABNORMAL HIGH (ref 70–99)
Potassium: 3.8 mmol/L (ref 3.5–5.1)
Sodium: 134 mmol/L — ABNORMAL LOW (ref 135–145)

## 2020-10-27 LAB — MAGNESIUM: Magnesium: 2 mg/dL (ref 1.7–2.4)

## 2020-10-27 IMAGING — DX DG CHEST 1V PORT
1 series · 1 of 1 positions shown · non-contrast
Comparison: Chest x-ray [DATE].  Chest CTA [DATE].

CLINICAL DATA: 79-year-old male with history of pneumonia.

EXAM:
PORTABLE CHEST 1 VIEW

[chest ap]
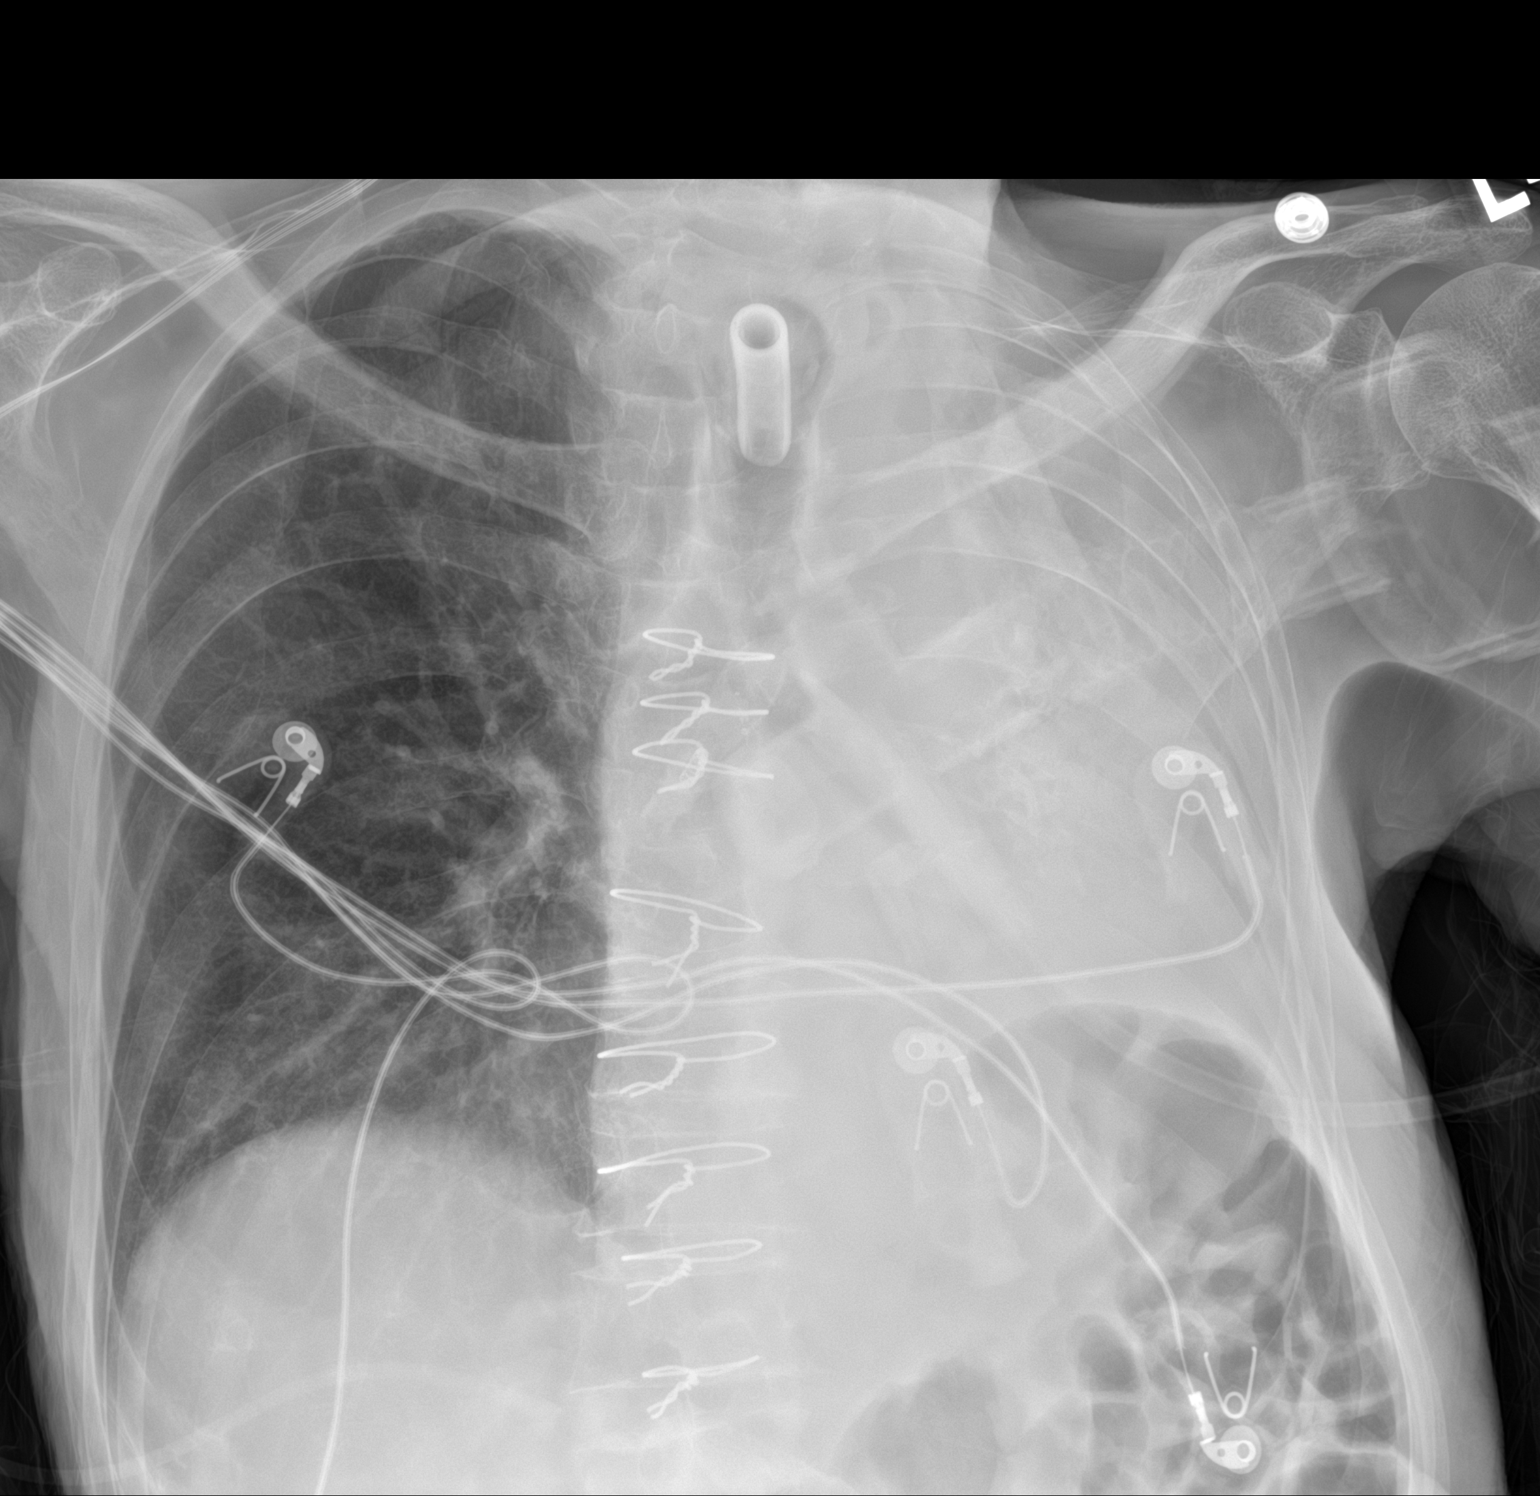

[1 of 1 positions shown; findings below may reference images not displayed]

FINDINGS: A tracheostomy tube is in place with tip 5.0 cm above the carina.
Complete opacification in the left hemithorax. Right lung appears
clear. No right pleural effusion. No pneumothorax. Cardiac and
mediastinal contours are obscured. Elevation of the left
hemidiaphragm. Status post median sternotomy.
IMPRESSION: 1. Support apparatus, as above.
2. Complete opacification in the left hemithorax with some elevation
of the left hemidiaphragm. The presence of significant left-sided
atelectasis is strongly suspected, although underlying airspace
consolidation and/or large pleural effusion are also likely present.
Given the rapid change compared to the recent chest CT, extensive
mucous plugging is considered a likely cause.

## 2020-10-28 ENCOUNTER — Other Ambulatory Visit (HOSPITAL_COMMUNITY): Payer: Medicare Other

## 2020-10-28 LAB — CBC
HCT: 28 % — ABNORMAL LOW (ref 39.0–52.0)
Hemoglobin: 9 g/dL — ABNORMAL LOW (ref 13.0–17.0)
MCH: 32.7 pg (ref 26.0–34.0)
MCHC: 32.1 g/dL (ref 30.0–36.0)
MCV: 101.8 fL — ABNORMAL HIGH (ref 80.0–100.0)
Platelets: 282 10*3/uL (ref 150–400)
RBC: 2.75 MIL/uL — ABNORMAL LOW (ref 4.22–5.81)
RDW: 16.3 % — ABNORMAL HIGH (ref 11.5–15.5)
WBC: 6.7 10*3/uL (ref 4.0–10.5)
nRBC: 0 % (ref 0.0–0.2)

## 2020-10-28 LAB — RENAL FUNCTION PANEL
Albumin: 2 g/dL — ABNORMAL LOW (ref 3.5–5.0)
Anion gap: 6 (ref 5–15)
BUN: 15 mg/dL (ref 8–23)
CO2: 34 mmol/L — ABNORMAL HIGH (ref 22–32)
Calcium: 8.1 mg/dL — ABNORMAL LOW (ref 8.9–10.3)
Chloride: 93 mmol/L — ABNORMAL LOW (ref 98–111)
Creatinine, Ser: 0.48 mg/dL — ABNORMAL LOW (ref 0.61–1.24)
GFR, Estimated: >60 mL/min (ref >60)
Glucose, Bld: 126 mg/dL — ABNORMAL HIGH (ref 70–99)
Phosphorus: 3.5 mg/dL (ref 2.5–4.6)
Potassium: 3.8 mmol/L (ref 3.5–5.1)
Sodium: 133 mmol/L — ABNORMAL LOW (ref 135–145)

## 2020-10-28 LAB — MAGNESIUM: Magnesium: 2.1 mg/dL (ref 1.7–2.4)

## 2020-10-28 IMAGING — DX DG CHEST 1V PORT
1 series · 1 of 1 positions shown · non-contrast
Comparison: Chest x-ray [DATE].

CLINICAL DATA: 79-year-old male with history of atelectasis and
pleural effusion.

EXAM:
PORTABLE CHEST 1 VIEW

[chest]
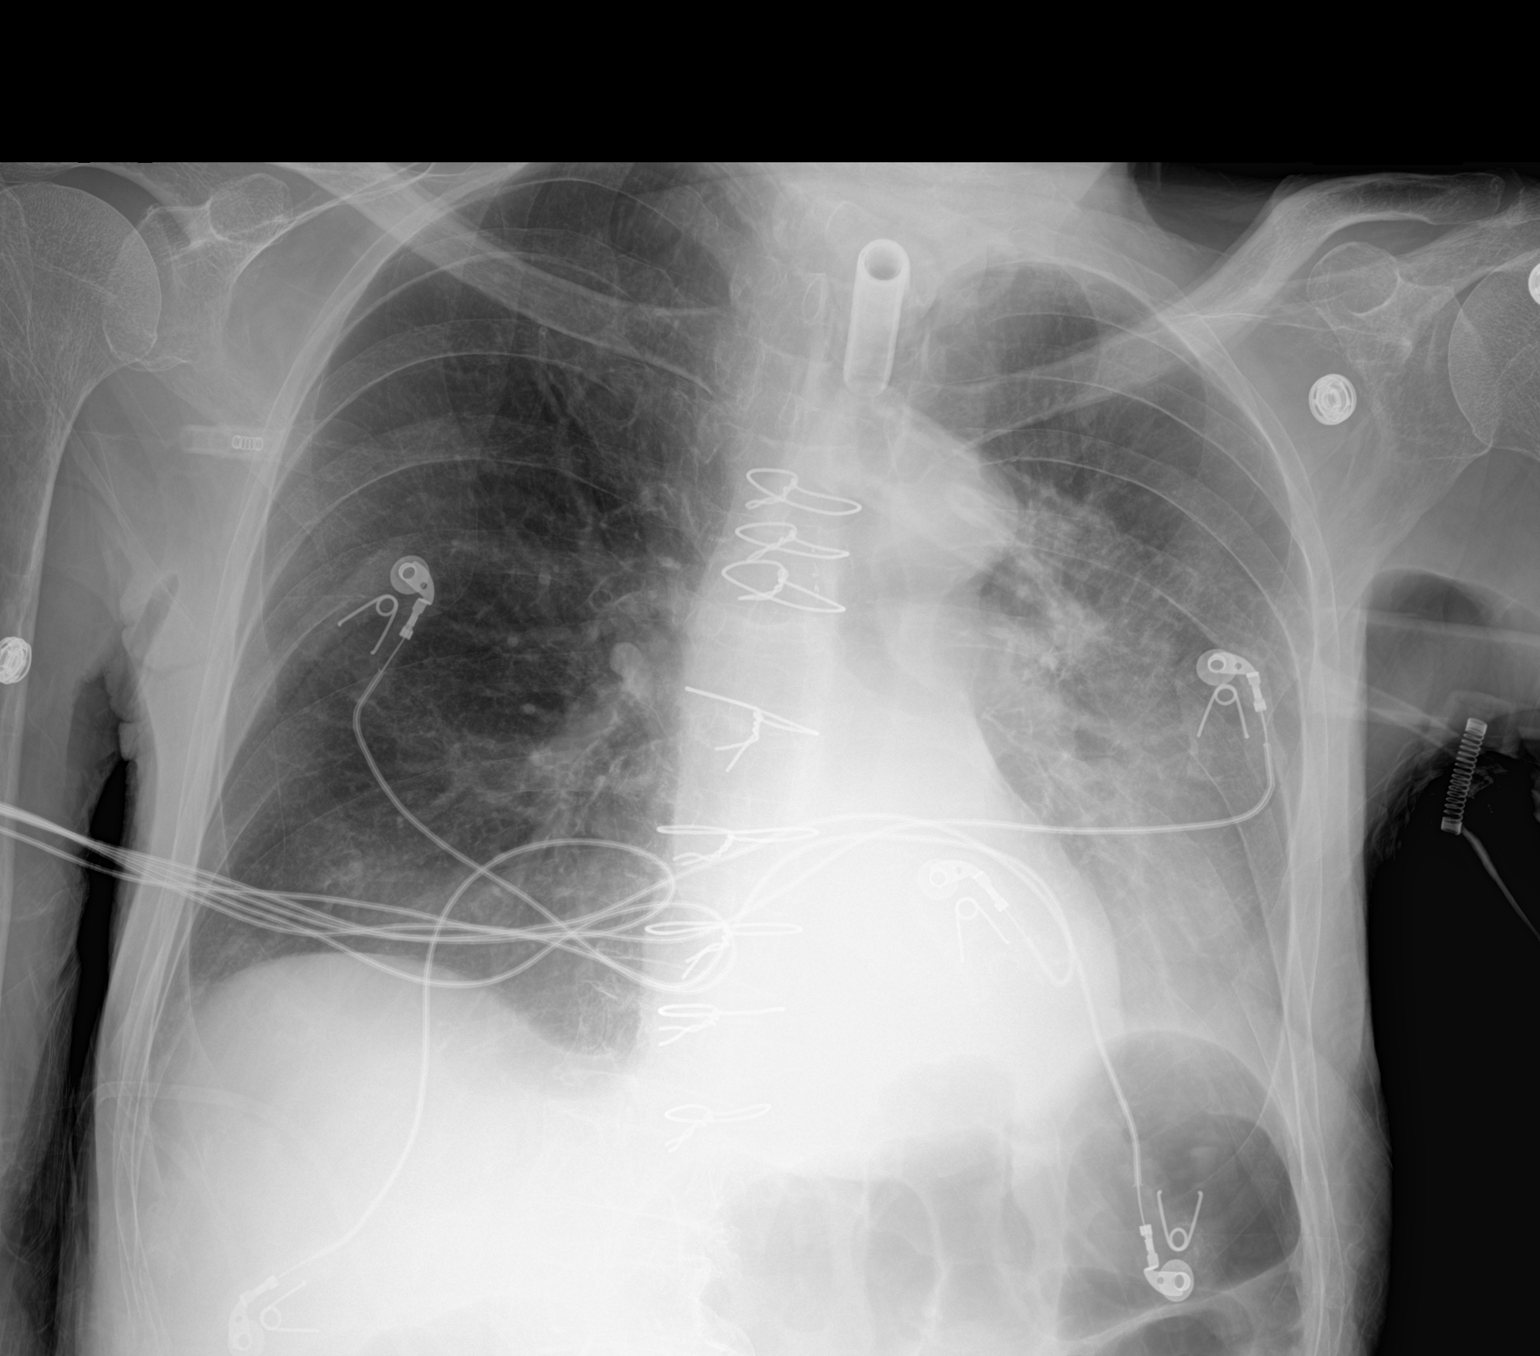

[1 of 1 positions shown; findings below may reference images not displayed]

FINDINGS: A tracheostomy tube is in place with tip 5.2 cm above the carina.
Significantly improved aeration in the left hemithorax, compatible
with resolving areas of atelectasis and/or consolidation, and likely
decreasing left pleural effusion. Right lung appears clear. No right
pleural effusion. No pneumothorax. No evidence of pulmonary edema.
Heart size is normal. The patient is rotated to the left on today's
exam, resulting in distortion of the mediastinal contours and
reduced diagnostic sensitivity and specificity for mediastinal
pathology. Atherosclerotic calcifications in the thoracic aorta.
Status post median sternotomy.
IMPRESSION: 1. Improving aeration in the left lung likely reflective of
resolving atelectasis and/or consolidation, and decreasing left
pleural effusion.
2. Aortic atherosclerosis.

## 2020-10-29 ENCOUNTER — Other Ambulatory Visit (HOSPITAL_COMMUNITY): Payer: Medicare Other

## 2020-10-29 ENCOUNTER — Encounter (HOSPITAL_COMMUNITY): Payer: Self-pay | Admitting: Otolaryngology

## 2020-10-29 LAB — CBC
HCT: 27.9 % — ABNORMAL LOW (ref 39.0–52.0)
HCT: 29.3 % — ABNORMAL LOW (ref 39.0–52.0)
Hemoglobin: 9.2 g/dL — ABNORMAL LOW (ref 13.0–17.0)
Hemoglobin: 9.4 g/dL — ABNORMAL LOW (ref 13.0–17.0)
MCH: 33.6 pg (ref 26.0–34.0)
MCH: 32.8 pg (ref 26.0–34.0)
MCHC: 33 g/dL (ref 30.0–36.0)
MCHC: 32.1 g/dL (ref 30.0–36.0)
MCV: 101.8 fL — ABNORMAL HIGH (ref 80.0–100.0)
MCV: 102.1 fL — ABNORMAL HIGH (ref 80.0–100.0)
Platelets: 286 10*3/uL (ref 150–400)
Platelets: 321 10*3/uL (ref 150–400)
RBC: 2.74 MIL/uL — ABNORMAL LOW (ref 4.22–5.81)
RBC: 2.87 MIL/uL — ABNORMAL LOW (ref 4.22–5.81)
RDW: 16.1 % — ABNORMAL HIGH (ref 11.5–15.5)
RDW: 16.1 % — ABNORMAL HIGH (ref 11.5–15.5)
WBC: 6.5 10*3/uL (ref 4.0–10.5)
WBC: 7.5 10*3/uL (ref 4.0–10.5)
nRBC: 0.3 % — ABNORMAL HIGH (ref 0.0–0.2)
nRBC: 0 % (ref 0.0–0.2)

## 2020-10-29 LAB — BASIC METABOLIC PANEL
Anion gap: 6 (ref 5–15)
BUN: 19 mg/dL (ref 8–23)
CO2: 36 mmol/L — ABNORMAL HIGH (ref 22–32)
Calcium: 8.2 mg/dL — ABNORMAL LOW (ref 8.9–10.3)
Chloride: 93 mmol/L — ABNORMAL LOW (ref 98–111)
Creatinine, Ser: 0.43 mg/dL — ABNORMAL LOW (ref 0.61–1.24)
GFR, Estimated: >60 mL/min (ref >60)
Glucose, Bld: 120 mg/dL — ABNORMAL HIGH (ref 70–99)
Potassium: 3.7 mmol/L (ref 3.5–5.1)
Sodium: 135 mmol/L (ref 135–145)

## 2020-10-29 LAB — CULTURE, RESPIRATORY W GRAM STAIN: Gram Stain: NONE SEEN

## 2020-10-29 LAB — PROTIME-INR
INR: 1.1 (ref 0.8–1.2)
Prothrombin Time: 14.3 seconds (ref 11.4–15.2)

## 2020-10-29 LAB — HEPARIN LEVEL (UNFRACTIONATED): Heparin Unfractionated: 0.12 IU/mL — ABNORMAL LOW (ref 0.30–0.70)

## 2020-10-29 LAB — APTT: aPTT: 30 seconds (ref 24–36)

## 2020-10-29 IMAGING — DX DG CHEST 1V PORT
1 series · 1 of 1 positions shown · non-contrast
Comparison: Portable chest [DATE] and earlier.

CLINICAL DATA: 79-year-old male with left pleural effusion, lower
lobe collapse or consolidation.

EXAM:
PORTABLE CHEST 1 VIEW

[chest]
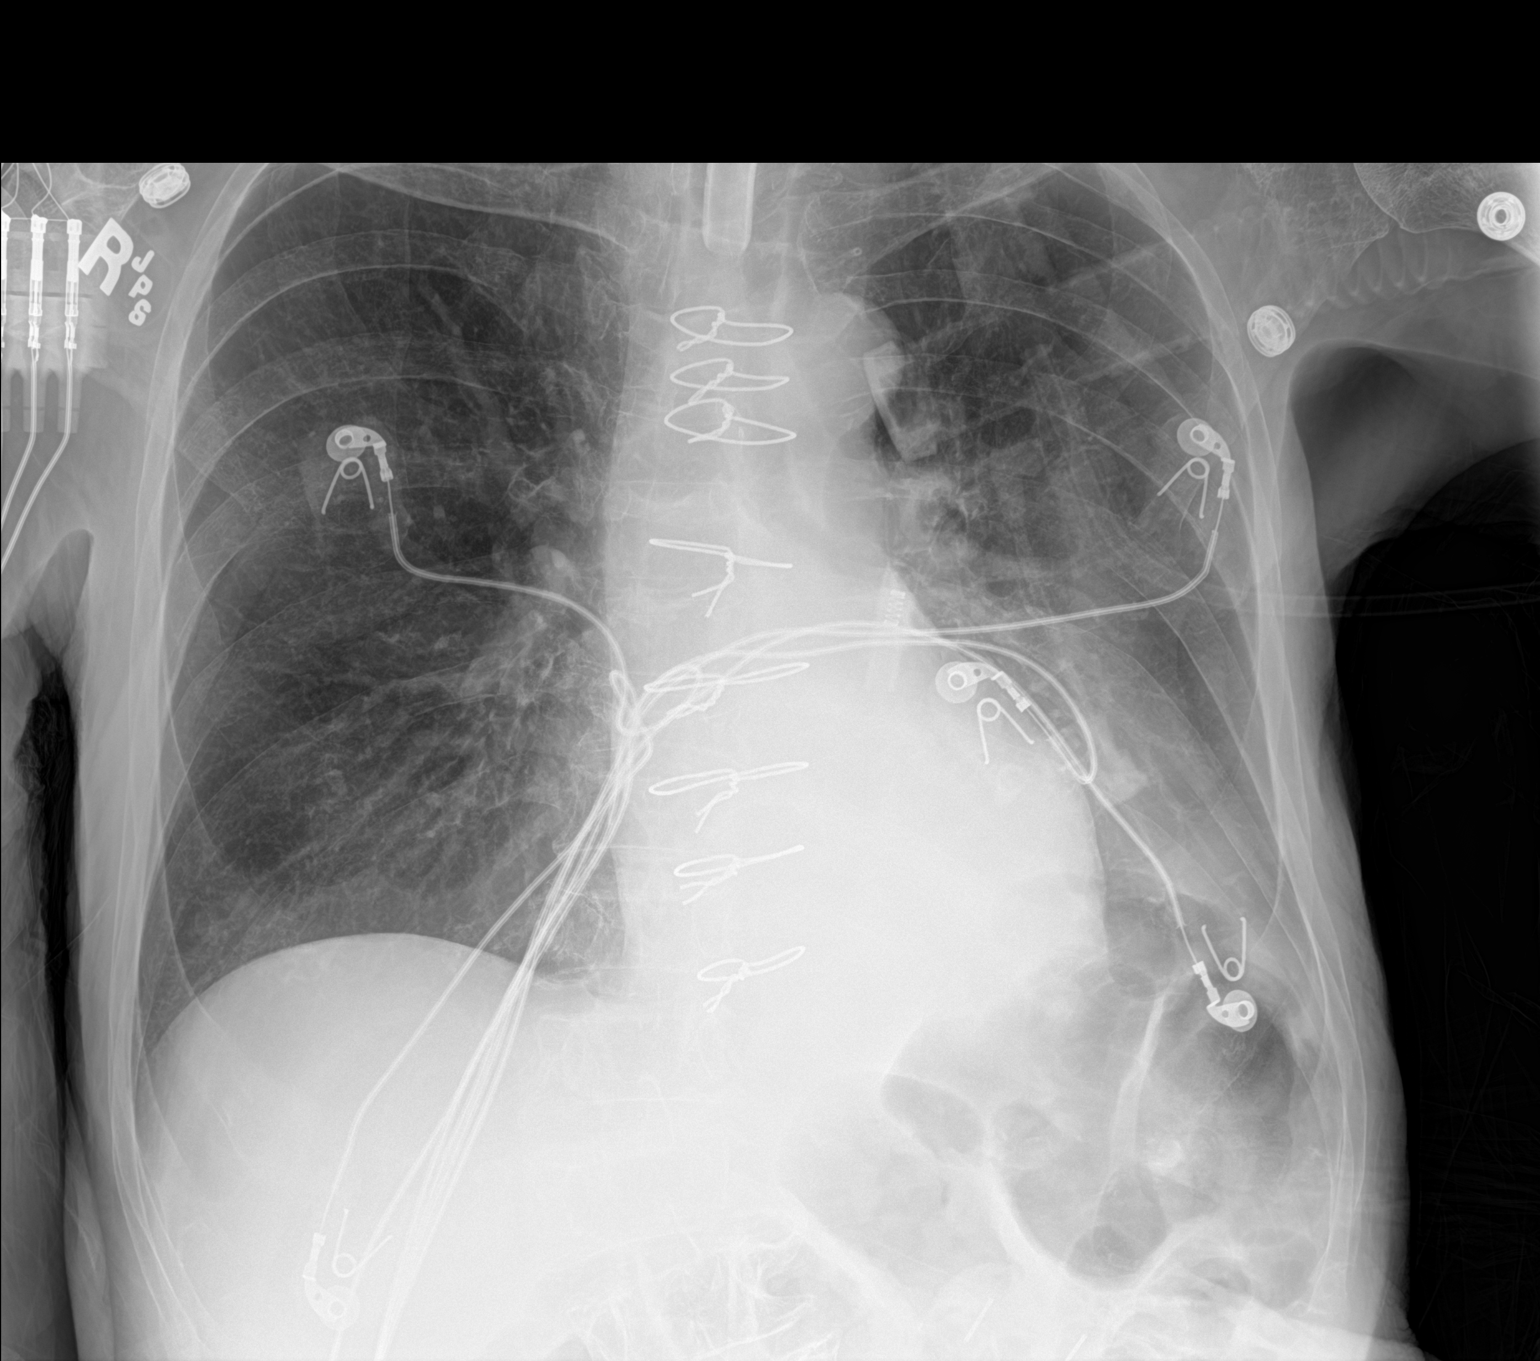

[1 of 1 positions shown; findings below may reference images not displayed]

FINDINGS: Portable AP semi upright view at [SI] hours. Stable tracheostomy.
Stable lung volumes and mediastinal contours. Substantially improved
left lung ventilation since [DATE], [DATE] and mildly improved lung
volumes and upper lobe ventilation since yesterday. Residual veiling
and confluent opacity left lower lung opacity not significantly
changed from yesterday. Stable right lung. Stable visible bowel gas
and osseous structures.
IMPRESSION: Improved lung volumes and upper lobe ventilation since yesterday
with otherwise stable residual left pleural effusion and lower lobe
collapse or consolidation.

## 2020-10-29 NOTE — Progress Notes (Signed)
Pulmonary Critical Care Medicine [place]  PROGRESS NOTE     Austin Davis  [id number]:[id number]  DOB: 01/01/1941   DOA: 09/29/2020  Referring Physician: Satira Sark, MD  HPI: Austin Davis is a 80 y.o. male being followed for ventilator/airway/oxygen weaning Acute on Chronic Respiratory Failure.  Patient had tracheostomy done Friday seems to be resting comfortably has been weaning on pressure support  Medications: Reviewed on Rounds  Physical Exam:  Vitals: Temperature is 98.0 pulse 77 respiratory is 25 blood pressure 110/61 saturations 100%  Ventilator Settings on pressure support FiO2 is 35% pressure 12/5  General: Comfortable at this time Neck: supple Cardiovascular: no malignant arrhythmias Respiratory: Scattered rhonchi expansion is equal Skin: no rash seen on limited exam Musculoskeletal: No gross abnormality Psychiatric:unable to assess Neurologic:no involuntary movements         Lab Data:   Basic Metabolic Panel: Recent Labs  Lab 10/24/20 0521 10/25/20 0309 10/27/20 0330 10/28/20 0342 10/29/20 0444  NA 131* 134* 134* 133* 135  K 4.1 4.2 3.8 3.8 3.7  CL 93* 96* 94* 93* 93*  CO2 32 32 32 34* 36*  GLUCOSE 112* 114* 119* 126* 120*  BUN 15 13 18 15 19   CREATININE 0.41* 0.40* 0.39* 0.48* 0.43*  CALCIUM 7.8* 7.9* 8.2* 8.1* 8.2*  MG 2.1  --  2.0 2.1  --   PHOS  --  2.8  --  3.5  --     ABG: No results for input(s): PHART, PCO2ART, PO2ART, HCO3, O2SAT in the last 168 hours.  Liver Function Tests: Recent Labs  Lab 10/25/20 0309 10/28/20 0342  ALBUMIN 1.7* 2.0*   No results for input(s): LIPASE, AMYLASE in the last 168 hours. No results for input(s): AMMONIA in the last 168 hours.  CBC: Recent Labs  Lab 10/25/20 0309 10/26/20 1514 10/27/20 0330 10/28/20 0342 10/29/20 0444  WBC 8.2 7.8 8.1 6.7 6.5  HGB 8.3* 9.0* 9.2* 9.0* 9.2*  HCT 25.6* 27.6* 27.5* 28.0* 27.9*  MCV 102.0* 103.0*  101.1* 101.8* 101.8*  PLT 266 263 278 282 286    Cardiac Enzymes: No results for input(s): CKTOTAL, CKMB, CKMBINDEX, TROPONINI in the last 168 hours.  BNP (last 3 results) Recent Labs    09/19/20 1606  BNP 876.9*    ProBNP (last 3 results) No results for input(s): PROBNP in the last 8760 hours.  Radiological Exams: DG CHEST PORT 1 VIEW  Result Date: 10/29/2020 CLINICAL DATA:  80 year old male with left pleural effusion, lower lobe collapse or consolidation. EXAM: PORTABLE CHEST 1 VIEW COMPARISON:  Portable chest 10/28/2020 and earlier. FINDINGS: Portable AP semi upright view at 0553 hours. Stable tracheostomy. Stable lung volumes and mediastinal contours. Substantially improved left lung ventilation since 10/08/202, 2 and mildly improved lung volumes and upper lobe ventilation since yesterday. Residual veiling and confluent opacity left lower lung opacity not significantly changed from yesterday. Stable right lung. Stable visible bowel gas and osseous structures. IMPRESSION: Improved lung volumes and upper lobe ventilation since yesterday with otherwise stable residual left pleural effusion and lower lobe collapse or consolidation. Electronically Signed   By: Genevie Ann M.D.   On: 10/29/2020 06:21   DG CHEST PORT 1 VIEW  Result Date: 10/28/2020 CLINICAL DATA:  80 year old male with history of atelectasis and pleural effusion. EXAM: PORTABLE CHEST 1 VIEW COMPARISON:  Chest x-ray 10/27/2020. FINDINGS: A tracheostomy tube is in place with tip 5.2 cm above the carina. Significantly improved aeration in the  left hemithorax, compatible with resolving areas of atelectasis and/or consolidation, and likely decreasing left pleural effusion. Right lung appears clear. No right pleural effusion. No pneumothorax. No evidence of pulmonary edema. Heart size is normal. The patient is rotated to the left on today's exam, resulting in distortion of the mediastinal contours and reduced diagnostic sensitivity and  specificity for mediastinal pathology. Atherosclerotic calcifications in the thoracic aorta. Status post median sternotomy. IMPRESSION: 1. Improving aeration in the left lung likely reflective of resolving atelectasis and/or consolidation, and decreasing left pleural effusion. 2. Aortic atherosclerosis. Electronically Signed   By: Vinnie Langton M.D.   On: 10/28/2020 07:36    Assessment/Plan Active Problems:   Acute on chronic respiratory failure with hypoxia and hypercapnia (HCC)   AF (paroxysmal atrial fibrillation) (HCC)   Obstructive sleep apnea   COPD, severe (HCC)   Chronic heart failure with preserved ejection fraction (HCC)   Acute on chronic respiratory failure with hypoxia we will continue with weaning on pressure support goal today is 16 hours. Paroxysmal atrial fibrillation) rate is controlled at this time Severe COPD medical management Obstructive sleep apnea nonissue Chronic heart failure preserved ejection fraction appears to be compensated   I have personally seen and evaluated the patient, evaluated laboratory and imaging results, formulated the assessment and plan and placed orders. The Patient requires high complexity decision making with multiple systems involvement.  Rounds were done with the Respiratory Therapy Director and Staff therapists and discussed with nursing staff also.  Allyne Gee, MD [facility] Pulmonary Critical Care Medicine Sleep Medicine

## 2020-10-30 DIAGNOSIS — I5032 Chronic diastolic (congestive) heart failure: Secondary | ICD-10-CM | POA: Diagnosis not present

## 2020-10-30 DIAGNOSIS — J9622 Acute and chronic respiratory failure with hypercapnia: Secondary | ICD-10-CM | POA: Diagnosis not present

## 2020-10-30 DIAGNOSIS — J9621 Acute and chronic respiratory failure with hypoxia: Secondary | ICD-10-CM | POA: Diagnosis not present

## 2020-10-30 DIAGNOSIS — G4733 Obstructive sleep apnea (adult) (pediatric): Secondary | ICD-10-CM | POA: Diagnosis not present

## 2020-10-30 DIAGNOSIS — I480 Paroxysmal atrial fibrillation: Secondary | ICD-10-CM | POA: Diagnosis not present

## 2020-10-30 DIAGNOSIS — J449 Chronic obstructive pulmonary disease, unspecified: Secondary | ICD-10-CM | POA: Diagnosis not present

## 2020-10-30 LAB — CBC
HCT: 25.6 % — ABNORMAL LOW (ref 39.0–52.0)
Hemoglobin: 8.4 g/dL — ABNORMAL LOW (ref 13.0–17.0)
MCH: 33.3 pg (ref 26.0–34.0)
MCHC: 32.8 g/dL (ref 30.0–36.0)
MCV: 101.6 fL — ABNORMAL HIGH (ref 80.0–100.0)
Platelets: 273 10*3/uL (ref 150–400)
RBC: 2.52 MIL/uL — ABNORMAL LOW (ref 4.22–5.81)
RDW: 16.1 % — ABNORMAL HIGH (ref 11.5–15.5)
WBC: 6.5 10*3/uL (ref 4.0–10.5)
nRBC: 0.3 % — ABNORMAL HIGH (ref 0.0–0.2)

## 2020-10-30 LAB — HEPARIN LEVEL (UNFRACTIONATED)
Heparin Unfractionated: 0.22 IU/mL — ABNORMAL LOW (ref 0.30–0.70)
Heparin Unfractionated: 0.62 IU/mL (ref 0.30–0.70)
Heparin Unfractionated: 0.18 IU/mL — ABNORMAL LOW (ref 0.30–0.70)

## 2020-10-30 LAB — BASIC METABOLIC PANEL
Anion gap: 5 (ref 5–15)
BUN: 20 mg/dL (ref 8–23)
CO2: 38 mmol/L — ABNORMAL HIGH (ref 22–32)
Calcium: 8.2 mg/dL — ABNORMAL LOW (ref 8.9–10.3)
Chloride: 91 mmol/L — ABNORMAL LOW (ref 98–111)
Creatinine, Ser: 0.42 mg/dL — ABNORMAL LOW (ref 0.61–1.24)
GFR, Estimated: >60 mL/min (ref >60)
Glucose, Bld: 117 mg/dL — ABNORMAL HIGH (ref 70–99)
Potassium: 3.8 mmol/L (ref 3.5–5.1)
Sodium: 134 mmol/L — ABNORMAL LOW (ref 135–145)

## 2020-10-30 LAB — MAGNESIUM: Magnesium: 2.1 mg/dL (ref 1.7–2.4)

## 2020-10-30 LAB — PHOSPHORUS: Phosphorus: 2.3 mg/dL — ABNORMAL LOW (ref 2.5–4.6)

## 2020-10-30 NOTE — Progress Notes (Signed)
Pulmonary Critical Care Medicine [place]  PROGRESS NOTE     REYAN HELLE  PJA:[id number]  DOB: [birth year]   DOA: 09/29/2020  Referring Physician: Satira Sark, MD  HPI: Austin Davis is a 80 y.o. male being followed for ventilator/airway/oxygen weaning Acute on Chronic Respiratory Failure.  Patient is doing without the same has been weaning on pressure FiO2 of 30% today supposed to do 16 hours  Medications: Reviewed on Rounds  Physical Exam:  Vitals: Temperature is 97.3 pulse 85 respiratory rate is 25 blood pressure is 113/61 saturations 98%  Ventilator Settings currently pressure support FiO2 30% pressure 12/5  General: Comfortable at this time Neck: supple Cardiovascular: no malignant arrhythmias Respiratory: Scattered rhonchi expansion is equal Skin: no rash seen on limited exam Musculoskeletal: No gross abnormality Psychiatric:unable to assess Neurologic:no involuntary movements         Lab Data:   Basic Metabolic Panel: Recent Labs  Lab 10/24/20 0521 10/25/20 0309 10/27/20 0330 10/28/20 0342 10/29/20 0444 10/30/20 0543  NA 131* 134* 134* 133* 135 134*  K 4.1 4.2 3.8 3.8 3.7 3.8  CL 93* 96* 94* 93* 93* 91*  CO2 32 32 32 34* 36* 38*  GLUCOSE 112* 114* 119* 126* 120* 117*  BUN 15 13 18 15 19 20   CREATININE 0.41* 0.40* 0.39* 0.48* 0.43* 0.42*  CALCIUM 7.8* 7.9* 8.2* 8.1* 8.2* 8.2*  MG 2.1  --  2.0 2.1  --  2.1  PHOS  --  2.8  --  3.5  --  2.3*    ABG: No results for input(s): PHART, PCO2ART, PO2ART, HCO3, O2SAT in the last 168 hours.  Liver Function Tests: Recent Labs  Lab 10/25/20 0309 10/28/20 0342  ALBUMIN 1.7* 2.0*   No results for input(s): LIPASE, AMYLASE in the last 168 hours. No results for input(s): AMMONIA in the last 168 hours.  CBC: Recent Labs  Lab 10/27/20 0330 10/28/20 0342 10/29/20 0444 10/29/20 1509 10/30/20 0543  WBC 8.1 6.7 6.5 7.5 6.5  HGB 9.2* 9.0*  9.2* 9.4* 8.4*  HCT 27.5* 28.0* 27.9* 29.3* 25.6*  MCV 101.1* 101.8* 101.8* 102.1* 101.6*  PLT 278 282 286 321 273    Cardiac Enzymes: No results for input(s): CKTOTAL, CKMB, CKMBINDEX, TROPONINI in the last 168 hours.  BNP (last 3 results) Recent Labs    09/19/20 1606  BNP 876.9*    ProBNP (last 3 results) No results for input(s): PROBNP in the last 8760 hours.  Radiological Exams: DG CHEST PORT 1 VIEW  Result Date: 10/29/2020 CLINICAL DATA:  80 year old male with left pleural effusion, lower lobe collapse or consolidation. EXAM: PORTABLE CHEST 1 VIEW COMPARISON:  Portable chest 10/28/2020 and earlier. FINDINGS: Portable AP semi upright view at 0553 hours. Stable tracheostomy. Stable lung volumes and mediastinal contours. Substantially improved left lung ventilation since 10/08/202, 2 and mildly improved lung volumes and upper lobe ventilation since yesterday. Residual veiling and confluent opacity left lower lung opacity not significantly changed from yesterday. Stable right lung. Stable visible bowel gas and osseous structures. IMPRESSION: Improved lung volumes and upper lobe ventilation since yesterday with otherwise stable residual left pleural effusion and lower lobe collapse or consolidation. Electronically Signed   By: Genevie Ann M.D.   On: 10/29/2020 06:21    Assessment/Plan Active Problems:   Acute on chronic respiratory failure with hypoxia and hypercapnia (HCC)   AF (paroxysmal atrial fibrillation) (HCC)   Obstructive sleep apnea   COPD, severe ([place])  Chronic heart failure with preserved ejection fraction (HCC)   Acute on chronic respiratory failure with hypoxia we will continue with pressure support titrate oxygen as tolerated continue pulmonary toilet.  Goal of 16 hours today Atrial fibrillation right now rate is controlled we will continue to monitor along closely. Severe COPD medical management Obstructive sleep apnea nonissue at this time Chronic heart failure  preserved ejection fraction supportive care   I have personally seen and evaluated the patient, evaluated laboratory and imaging results, formulated the assessment and plan and placed orders. The Patient requires high complexity decision making with multiple systems involvement.  Rounds were done with the Respiratory Therapy Director and Staff therapists and discussed with nursing staff also.  Allyne Gee, MD [facility] Pulmonary Critical Care Medicine Sleep Medicine

## 2020-10-31 ENCOUNTER — Other Ambulatory Visit (HOSPITAL_COMMUNITY): Payer: Medicare Other

## 2020-10-31 LAB — HEPARIN LEVEL (UNFRACTIONATED)
Heparin Unfractionated: 0.54 IU/mL (ref 0.30–0.70)
Heparin Unfractionated: 0.13 IU/mL — ABNORMAL LOW (ref 0.30–0.70)

## 2020-10-31 LAB — CBC
HCT: 24 % — ABNORMAL LOW (ref 39.0–52.0)
Hemoglobin: 7.6 g/dL — ABNORMAL LOW (ref 13.0–17.0)
MCH: 33 pg (ref 26.0–34.0)
MCHC: 31.7 g/dL (ref 30.0–36.0)
MCV: 104.3 fL — ABNORMAL HIGH (ref 80.0–100.0)
Platelets: 249 10*3/uL (ref 150–400)
RBC: 2.3 MIL/uL — ABNORMAL LOW (ref 4.22–5.81)
RDW: 16 % — ABNORMAL HIGH (ref 11.5–15.5)
WBC: 4.8 10*3/uL (ref 4.0–10.5)
nRBC: 0.6 % — ABNORMAL HIGH (ref 0.0–0.2)

## 2020-10-31 LAB — BASIC METABOLIC PANEL
Anion gap: 4 — ABNORMAL LOW (ref 5–15)
BUN: 20 mg/dL (ref 8–23)
CO2: 40 mmol/L — ABNORMAL HIGH (ref 22–32)
Calcium: 8.2 mg/dL — ABNORMAL LOW (ref 8.9–10.3)
Chloride: 89 mmol/L — ABNORMAL LOW (ref 98–111)
Creatinine, Ser: 0.4 mg/dL — ABNORMAL LOW (ref 0.61–1.24)
GFR, Estimated: >60 mL/min (ref >60)
Glucose, Bld: 112 mg/dL — ABNORMAL HIGH (ref 70–99)
Potassium: 3.5 mmol/L (ref 3.5–5.1)
Sodium: 133 mmol/L — ABNORMAL LOW (ref 135–145)

## 2020-10-31 LAB — PHOSPHORUS: Phosphorus: 2.3 mg/dL — ABNORMAL LOW (ref 2.5–4.6)

## 2020-10-31 LAB — MAGNESIUM: Magnesium: 2.2 mg/dL (ref 1.7–2.4)

## 2020-10-31 NOTE — Progress Notes (Signed)
Pulmonary Critical Care Medicine [place]  PROGRESS NOTE     DEWARREN LEDBETTER  JQB:[id number]  DOB: October 27, 1940   DOA: 09/29/2020  Referring Physician: Satira Sark, MD  HPI: Austin Davis is a 80 y.o. male being followed for ventilator/airway/oxygen weaning Acute on Chronic Respiratory Failure.  Patient currently is on pressure support has been pressure 12/5  Medications: Reviewed on Rounds  Physical Exam:  Vitals: Temperature 96.9 pulse 70 respiratory rate is 22 blood pressure is 108/51 saturations 98%  Ventilator Settings on pressure support FiO2 is 28% pressure 12/5  General: Comfortable at this time Neck: supple Cardiovascular: no malignant arrhythmias Respiratory: Scattered rhonchi expansion is equal Skin: no rash seen on limited exam Musculoskeletal: No gross abnormality Psychiatric:unable to assess Neurologic:no involuntary movements         Lab Data:   Basic Metabolic Panel: Recent Labs  Lab 10/25/20 0309 10/27/20 0330 10/28/20 0342 10/29/20 0444 10/30/20 0543 10/31/20 0334  NA 134* 134* 133* 135 134* 133*  K 4.2 3.8 3.8 3.7 3.8 3.5  CL 96* 94* 93* 93* 91* 89*  CO2 32 32 34* 36* 38* 40*  GLUCOSE 114* 119* 126* 120* 117* 112*  BUN 13 18 15 19 20 20   CREATININE 0.40* 0.39* 0.48* 0.43* 0.42* 0.40*  CALCIUM 7.9* 8.2* 8.1* 8.2* 8.2* 8.2*  MG  --  2.0 2.1  --  2.1 2.2  PHOS 2.8  --  3.5  --  2.3* 2.3*    ABG: No results for input(s): PHART, PCO2ART, PO2ART, HCO3, O2SAT in the last 168 hours.  Liver Function Tests: Recent Labs  Lab 10/25/20 0309 10/28/20 0342  ALBUMIN 1.7* 2.0*   No results for input(s): LIPASE, AMYLASE in the last 168 hours. No results for input(s): AMMONIA in the last 168 hours.  CBC: Recent Labs  Lab 10/28/20 0342 10/29/20 0444 10/29/20 1509 10/30/20 0543 10/31/20 0334  WBC 6.7 6.5 7.5 6.5 4.8  HGB 9.0* 9.2* 9.4* 8.4* 7.6*  HCT 28.0* 27.9* 29.3* 25.6*  24.0*  MCV 101.8* 101.8* 102.1* 101.6* 104.3*  PLT 282 286 321 273 249    Cardiac Enzymes: No results for input(s): CKTOTAL, CKMB, CKMBINDEX, TROPONINI in the last 168 hours.  BNP (last 3 results) Recent Labs    09/19/20 1606  BNP 876.9*    ProBNP (last 3 results) No results for input(s): PROBNP in the last 8760 hours.  Radiological Exams: DG CHEST PORT 1 VIEW  Result Date: 10/31/2020 CLINICAL DATA:  Ventilator dependent respiratory failure. EXAM: PORTABLE CHEST 1 VIEW COMPARISON:  10/29/2020 FINDINGS: The tracheostomy tube is stable. The cardiac silhouette, mediastinal and hilar contours are within normal limits and stable. Improved bibasilar lung aeration with less consolidation in the left lower lobe. No pleural effusions. No pneumothorax. IMPRESSION: Resolving left lower lobe airspace consolidation and improved bibasilar aeration. Electronically Signed   By: Marijo Sanes M.D.   On: 10/31/2020 05:55    Assessment/Plan Active Problems:   Acute on chronic respiratory failure with hypoxia and hypercapnia (HCC)   AF (paroxysmal atrial fibrillation) (HCC)   Obstructive sleep apnea   COPD, severe (HCC)   Chronic heart failure with preserved ejection fraction (HCC)   Acute on chronic respiratory failure with hypoxia patient currently is pressure support has been on 28% FiO2 on a 12/5 pressure we will continue for 16 hours Atrial fibrillation rate controlled we will continue to follow along closely Severe COPD medical management Obstructive sleep apnea no changes  nonissue on the vent Chronic heart failure preserved ejection fraction we will continue with supportive care   I have personally seen and evaluated the patient, evaluated laboratory and imaging results, formulated the assessment and plan and placed orders. The Patient requires high complexity decision making with multiple systems involvement.  Rounds were done with the Respiratory Therapy Director and Staff therapists  and discussed with nursing staff also.  Allyne Gee, MD [facility] Pulmonary Critical Care Medicine Sleep Medicine

## 2020-11-01 ENCOUNTER — Other Ambulatory Visit (HOSPITAL_COMMUNITY): Payer: Medicare Other

## 2020-11-01 HISTORY — PX: IR IVC FILTER PLMT / S&I /IMG GUID/MOD SED: IMG701

## 2020-11-01 LAB — BASIC METABOLIC PANEL
Anion gap: 8 (ref 5–15)
BUN: 22 mg/dL (ref 8–23)
CO2: 38 mmol/L — ABNORMAL HIGH (ref 22–32)
Calcium: 8.1 mg/dL — ABNORMAL LOW (ref 8.9–10.3)
Chloride: 89 mmol/L — ABNORMAL LOW (ref 98–111)
Creatinine, Ser: 0.39 mg/dL — ABNORMAL LOW (ref 0.61–1.24)
GFR, Estimated: >60 mL/min (ref >60)
Glucose, Bld: 135 mg/dL — ABNORMAL HIGH (ref 70–99)
Potassium: 3.5 mmol/L (ref 3.5–5.1)
Sodium: 135 mmol/L (ref 135–145)

## 2020-11-01 LAB — HEPARIN LEVEL (UNFRACTIONATED)
Heparin Unfractionated: 0.5 IU/mL (ref 0.30–0.70)
Heparin Unfractionated: 0.29 IU/mL — ABNORMAL LOW (ref 0.30–0.70)

## 2020-11-01 LAB — CBC
HCT: 25 % — ABNORMAL LOW (ref 39.0–52.0)
Hemoglobin: 8.1 g/dL — ABNORMAL LOW (ref 13.0–17.0)
MCH: 33.3 pg (ref 26.0–34.0)
MCHC: 32.4 g/dL (ref 30.0–36.0)
MCV: 102.9 fL — ABNORMAL HIGH (ref 80.0–100.0)
Platelets: 262 10*3/uL (ref 150–400)
RBC: 2.43 MIL/uL — ABNORMAL LOW (ref 4.22–5.81)
RDW: 16 % — ABNORMAL HIGH (ref 11.5–15.5)
WBC: 6.3 10*3/uL (ref 4.0–10.5)
nRBC: 0.5 % — ABNORMAL HIGH (ref 0.0–0.2)

## 2020-11-01 LAB — MAGNESIUM: Magnesium: 2.2 mg/dL (ref 1.7–2.4)

## 2020-11-01 LAB — PHOSPHORUS: Phosphorus: 3.3 mg/dL (ref 2.5–4.6)

## 2020-11-01 IMAGING — US IR IVC FILTER PLMT / S&I /IMG GUID/MOD SED
3 series · 11 of 11 positions shown · non-contrast
Comparison: none

EXAM:
INFERIOR VENA CAVA (IVC) FILTER INSERTION

Procedural Personnel
Attending physician(s): POLOVNI DJELOVI
POLOVNI DJELOVI physician(s): None
Resident physician(s): None
Advanced practice provider(s): None
Pre-procedure diagnosis: Left lower extremity DVT
Post-procedure diagnosis: Same
INDICATION: Venous thromboembolism and complication of
anticoagulation/Venous thromboembolism with limited cardiopulmonary
reserve
Additional clinical history: None
Complications: No immediate complications.

[Series 1: body 4 care · 2 acquisitions, 5 frames shown (1 of 2)]
[im 1/2]
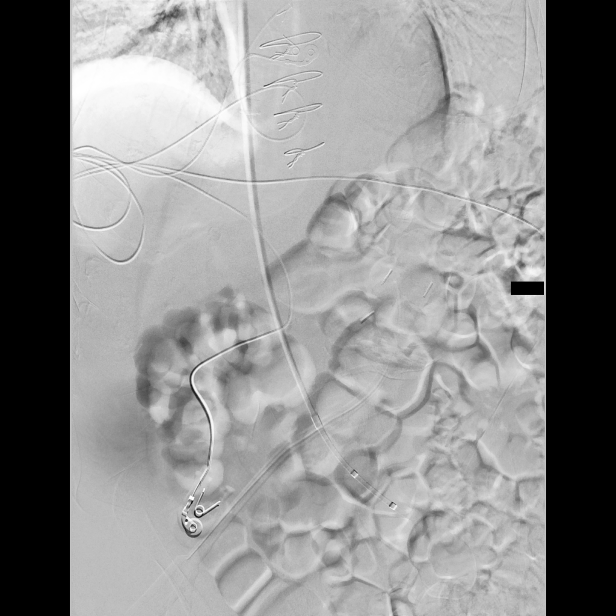
[im 1/2]
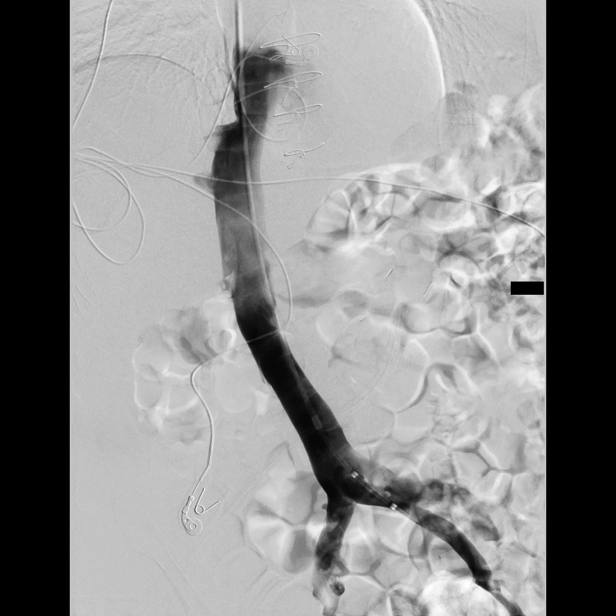
[im 1/2]
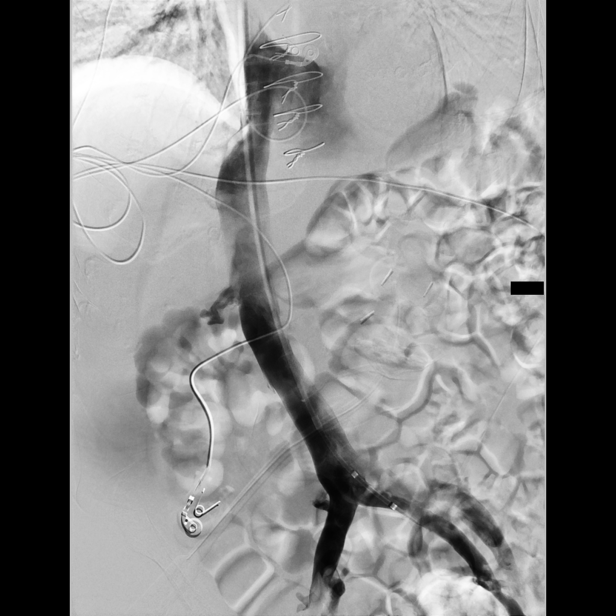
[im 1/2]
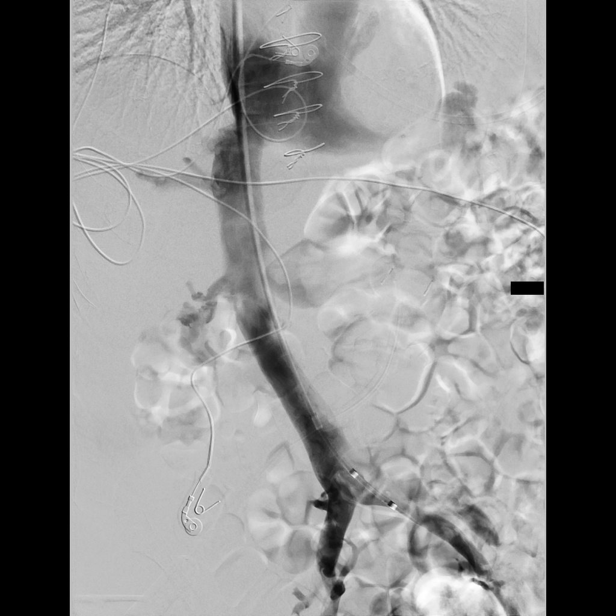
[im 2/2]
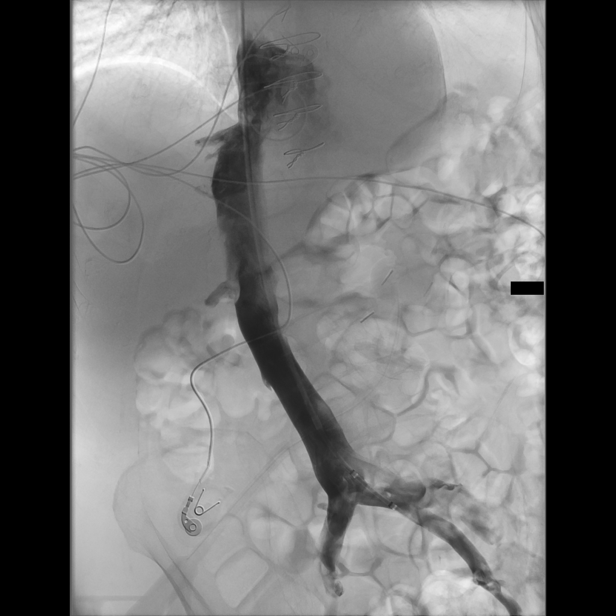

[Series 1: ir ivc filter plmt / s&i /img guid/mod sed · 1 of 1 slices shown]
[im 1/1]
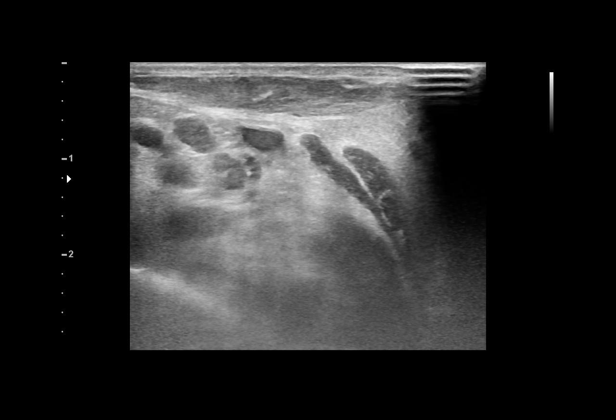

[Series 2: body 4 care · 2 acquisitions, 5 frames shown (2 of 2)]
[im 1/2]
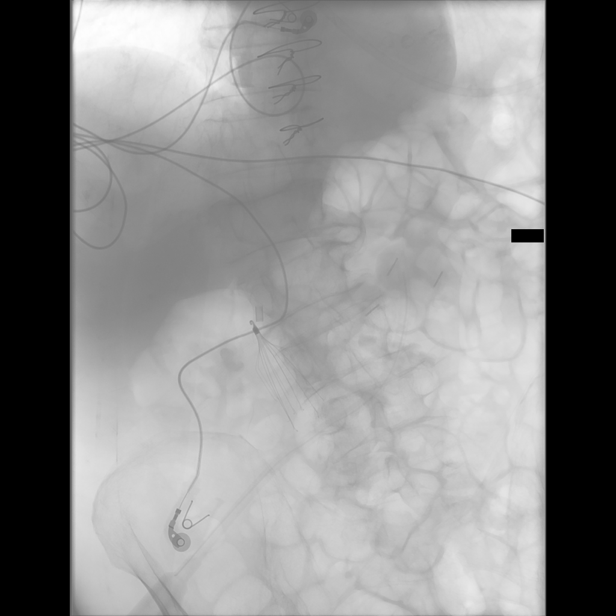
[im 1/2]
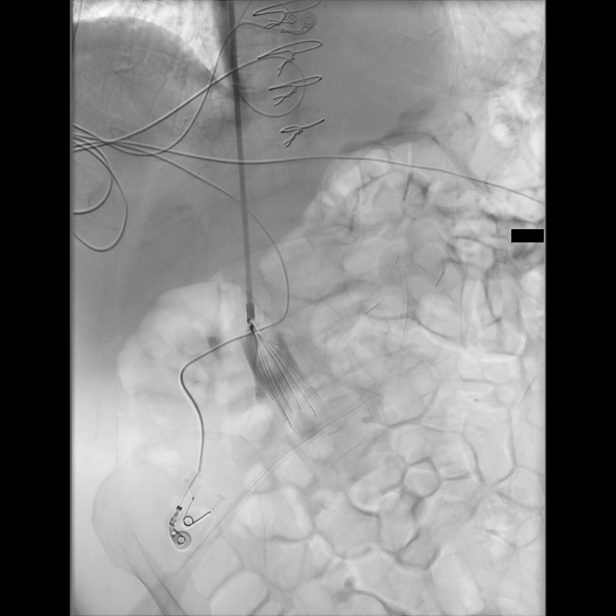
[im 1/2]
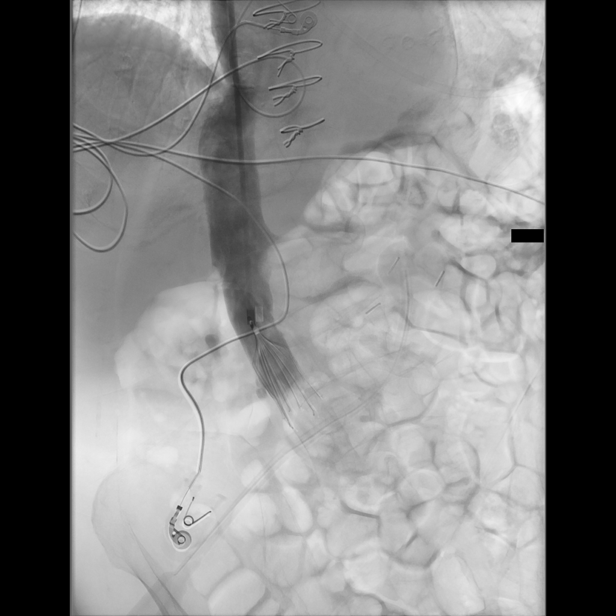
[im 1/2]
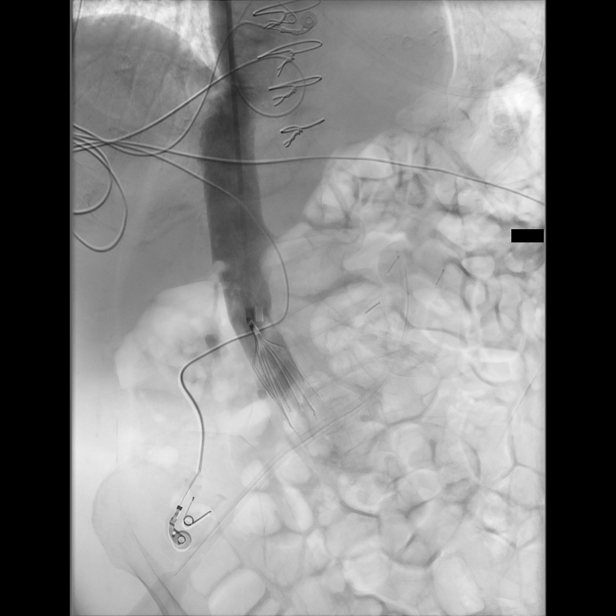
[im 2/2]
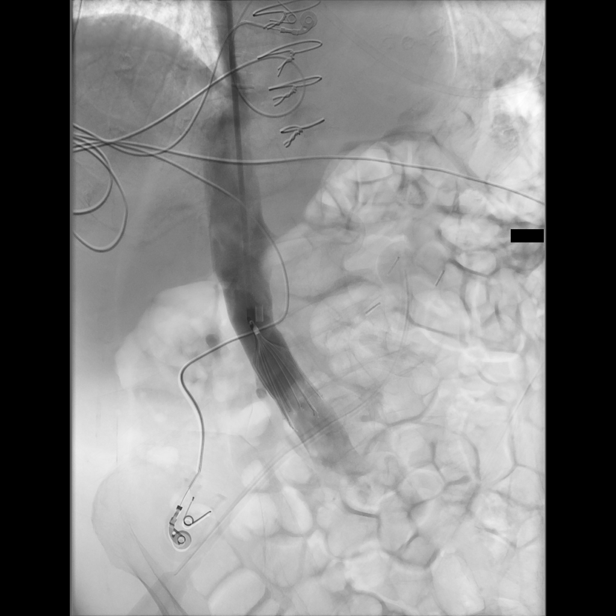

[11 of 11 positions shown; findings below may reference images not displayed]

IMPRESSION: Insertion of inferior vena cava filter in the infrarenal IVC.

PLAN:
Retrieval intent (MIPS): The filter is potentially retrievable.

PROCEDURE:
SUMMARY

IVC filter insertion under fluoroscopic guidance

Additional procedure(s): None

PROCEDURE DETAILS

Pre-procedure

Consent: Informed consent for the procedure including risks,
benefits and alternatives was obtained and time-out was performed
prior to the procedure.

Preparation: The site was prepared and draped using maximal sterile
barrier technique including cutaneous antisepsis.

Anesthesia/sedation

Level of anesthesia/sedation: None

Anesthesia/sedation administered by: Not applicable

Total intra-service sedation time (minutes): Not applicable

Access

Local anesthesia was administered. The vessel was sonographically
evaluated and determined to be patent. Real time ultrasound was used
to visualize needle entry into the vessel and a permanent image
access was stored.

Vein accessed: Right internal jugular vein

Access technique: Micropuncture [DATE] gauge needle

Venography

Venography of the inferior vena cava (IVC) was performed to evaluate
IVC size, anatomy and patency.

Catheter tip position for venography: Left common iliac vein

Venography findings: No caval thrombus, normal caliber.

Filter placement

The filter delivery sheath was advanced and the filter was deployed
under fluoroscopic guidance.

Filter placed: Infrarenal IVC at the L3 level

Post-placement imaging

Post-placement imaging technique: IVC venography

Filter position: Infrarenal IVC

Additional findings: None

Closure

The sheath was removed and hemostasis was achieved with manual
compression. A sterile dressing was applied.

Contrast

Contrast agent: Omnipaque 300

Contrast volume (mL): 20

Radiation Dose

Fluoroscopy time: 54 seconds

Reference air kerma: 14.3 mGy

Kerma area product: Not provided by imaging equipment

Additional Details

Additional description of procedure: None

Equipment details: POLOVNI DJELOVI retrievable filter

Specimens removed: None

Estimated blood loss (mL): Less than 10

Attestation

Signer name: POLOVNI DJELOVI

POLOVNI DJELOVI attest that I was present for the entire procedure and I reviewed
the stored images and agree with the report as written.

## 2020-11-01 MED ORDER — IOHEXOL 300 MG/ML  SOLN
100.0000 mL | Freq: Once | INTRAMUSCULAR | Status: AC | PRN
  Administered 2020-11-01: 35 mL via INTRAVENOUS

## 2020-11-01 MED ORDER — LIDOCAINE HCL 1 % IJ SOLN
INTRAMUSCULAR | Status: DC | PRN
  Administered 2020-11-01: 10 mL via INTRADERMAL

## 2020-11-01 MED ORDER — LIDOCAINE HCL 1 % IJ SOLN
INTRAMUSCULAR | Status: AC
  Filled 2020-11-01: qty 20

## 2020-11-01 NOTE — Progress Notes (Signed)
   Scheduled for retrievable IVC filter placement  Risks and benefits discussed with the patient's daughter [name] via phone including, but not limited to bleeding, infection, contrast induced renal failure, filter fracture or migration which can lead to emergency surgery or even death, strut penetration with damage or irritation to adjacent structures and caval thrombosis.  Kelly called back and [place] PA and RN discussed with her pts status and she is agreeable to proceed  All of the patient's questions were answered, family is agreeable to proceed. Consent signed and in chart.

## 2020-11-01 NOTE — Progress Notes (Signed)
Pulmonary Critical Care Medicine [facility]  PROGRESS NOTE     Austin Davis  IOE:[id number]  DOB: 04-29-1940   DOA: 09/29/2020  Referring Physician: Satira Sark, MD  HPI: Austin Davis is a 80 y.o. male being followed for ventilator/airway/oxygen weaning Acute on Chronic Respiratory Failure.  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  Right now.  Assist-control mode  Medications: Reviewed on Rounds  Physical Exam:  Vitals: Temperature is 98.0 pulse 68 respiratory rate is 18 blood pressure is 98/48 saturations 98%  Ventilator Settings assist-control FiO2 28% PEEP 5 tidal volume 420  General: Comfortable at this time Neck: supple Cardiovascular: no malignant arrhythmias Respiratory: No rhonchi no rales are noted at this time Skin: no rash seen on limited exam Musculoskeletal: No gross abnormality Psychiatric:unable to assess Neurologic:no involuntary movements         Lab Data:   Basic Metabolic Panel: Recent Labs  Lab 10/27/20 0330 10/28/20 0342 10/29/20 0444 10/30/20 0543 10/31/20 0334 11/01/20 0059  NA 134* 133* 135 134* 133* 135  K 3.8 3.8 3.7 3.8 3.5 3.5  CL 94* 93* 93* 91* 89* 89*  CO2 32 34* 36* 38* 40* 38*  GLUCOSE 119* 126* 120* 117* 112* 135*  BUN 18 15 19 20 20 22   CREATININE 0.39* 0.48* 0.43* 0.42* 0.40* 0.39*  CALCIUM 8.2* 8.1* 8.2* 8.2* 8.2* 8.1*  MG 2.0 2.1  --  2.1 2.2 2.2  PHOS  --  3.5  --  2.3* 2.3* 3.3    ABG: No results for input(s): PHART, PCO2ART, PO2ART, HCO3, O2SAT in the last 168 hours.  Liver Function Tests: Recent Labs  Lab 10/28/20 0342  ALBUMIN 2.0*   No results for input(s): LIPASE, AMYLASE in the last 168 hours. No results for input(s): AMMONIA in the last 168 hours.  CBC: Recent Labs  Lab 10/29/20 0444 10/29/20 1509 10/30/20 0543 10/31/20 0334 11/01/20 0059  WBC 6.5 7.5 6.5 4.8 6.3  HGB 9.2* 9.4* 8.4* 7.6* 8.1*  HCT  27.9* 29.3* 25.6* 24.0* 25.0*  MCV 101.8* 102.1* 101.6* 104.3* 102.9*  PLT 286 321 273 249 262    Cardiac Enzymes: No results for input(s): CKTOTAL, CKMB, CKMBINDEX, TROPONINI in the last 168 hours.  BNP (last 3 results) Recent Labs    09/19/20 1606  BNP 876.9*    ProBNP (last 3 results) No results for input(s): PROBNP in the last 8760 hours.  Radiological Exams: DG CHEST PORT 1 VIEW  Result Date: 10/31/2020 CLINICAL DATA:  Ventilator dependent respiratory failure. EXAM: PORTABLE CHEST 1 VIEW COMPARISON:  10/29/2020 FINDINGS: The tracheostomy tube is stable. The cardiac silhouette, mediastinal and hilar contours are within normal limits and stable. Improved bibasilar lung aeration with less consolidation in the left lower lobe. No pleural effusions. No pneumothorax. IMPRESSION: Resolving left lower lobe airspace consolidation and improved bibasilar aeration. Electronically Signed   By: Marijo Sanes M.D.   On: 10/31/2020 05:55    Assessment/Plan Active Problems:   Acute on chronic respiratory failure with hypoxia and hypercapnia (HCC)   AF (paroxysmal atrial fibrillation) (HCC)   Obstructive sleep apnea   COPD, severe (HCC)   Chronic heart failure with preserved ejection fraction (HCC)   Acute on chronic respiratory failure with hypoxia plan is going to be to continue with full support on the ventilator.  Since the patient is bleeding.  We will hold off on aggressive weaning today. Atrial fibrillation right now rate is controlled  we will continue to follow along Severe COPD medical management continue with present therapy. Chronic heart failure preserved ejection fraction we will continue to follow along closely Obstructive sleep apnea nonissue   I have personally seen and evaluated the patient, evaluated laboratory and imaging results, formulated the assessment and plan and placed orders. The Patient requires high complexity decision making with multiple systems involvement.   Rounds were done with the Respiratory Therapy Director and Staff therapists and discussed with nursing staff also.  Allyne Gee, MD [facility] Pulmonary Critical Care Medicine Sleep Medicine

## 2020-11-01 NOTE — Consult Note (Addendum)
Chief Complaint: Patient was seen in consultation today for retrievable inferior vena cava filter placement at the request of Robb Matar PA   Supervising Physician: Arne Cleveland  Patient Status: [facility]  History of Present Illness: Austin Davis is a 80 y.o. male   Afib- coumadin; OSA; PCM Pt admitted to [facility] with increased SOB COPD; Resp distress  admission 09/29/20  Known to IR-- G tube placed 9/29 Coumadin held for same Left leg swelling noted Summary: Doppler: RIGHT: - Findings consistent with acute superficial vein thrombosis involving the right great saphenous vein. - No cystic structure found in the popliteal fossa. LEFT: - Findings consistent with acute deep vein thrombosis involving the left common femoral vein, SF junction, and left femoral vein. - No cystic structure found in the popliteal fossa.  Started on heparin drip Developed hematuria in several days----heparin stopped and restarted few days later; Hematuria restarted and bleeding from trach site  Hep stopped secondary intolerance Request now for IVC filter placement  Discussed with Dr Vernard Gambles--- he approves procedure  Past Medical History:  Diagnosis Date   CHF (congestive heart failure) ([place])    Coronary artery disease    GI bleed 2019   hospitalized at [facility] for one week   Headache    since childhood   Low blood sugar    since childhood, controlled by diet   Mitral regurgitation    Mitral valve prolapse    Osteopenia 2021   Paroxysmal atrial fibrillation ([place])     Past Surgical History:  Procedure Laterality Date   COLONOSCOPY WITH PROPOFOL N/A 02/19/2017   Procedure: COLONOSCOPY WITH PROPOFOL;  Surgeon: Wilford Corner, MD;  Location: [facility];  Service: Endoscopy;  Laterality: N/A;   IR GASTROSTOMY TUBE MOD SED  10/18/2020   laser eye surgery for retina detachment     MITRAL VALVE REPAIR  01/2003   monitor  02/05/2006   polyp removal     TONSILLECTOMY      tooth removal     as a teenager   TRACHEOSTOMY TUBE PLACEMENT N/A 10/26/2020   Procedure: TRACHEOSTOMY;  Surgeon: Rozetta Nunnery, MD;  Location: [place];  Service: ENT;  Laterality: N/A;    Allergies: Prednisone and Cortisone  Medications: Prior to Admission medications   Medication Sig Start Date End Date Taking? Authorizing Provider  acetaminophen (TYLENOL) 500 MG tablet Take 500 mg by mouth every 6 (six) hours as needed for mild pain or headache.    [provider]  ALPRAZolam Duanne Moron) 0.25 MG tablet Take 0.125 mg by mouth at bedtime as needed for anxiety.    [provider]  Ascorbic Acid (VITAMIN C IMMUNE HEALTH PO) Take 500 mg by mouth.    [provider]  B Complex Vitamins (VITAMIN B COMPLEX PO) Take 1 tablet by mouth daily.    [provider]  Cholecalciferol (D3) 50 MCG (2000 UT) TABS Take 2,000 Units by mouth daily.    [provider]  diltiazem (CARDIZEM) 30 MG tablet Take 1 tablet (30 mg total) by mouth every 12 (twelve) hours. 09/28/20   [name], MD  feeding supplement (ENSURE ENLIVE / ENSURE PLUS) LIQD Take 237 mLs by mouth 2 (two) times daily between meals. 09/28/20   [name], MD  folic acid (FOLVITE) 1 MG tablet Take 1 tablet (1 mg total) by mouth daily. 09/29/20   [name], MD  Magnesium 250 MG TABS Take 250 mg by mouth daily.  [provider]  midodrine (PROAMATINE) 5 MG tablet Take 1 tablet (5 mg total) by mouth 3 (three) times daily with meals. 09/28/20   [name], MD  mirtazapine (REMERON) 7.5 MG tablet Take 1 tablet (7.5 mg total) by mouth at bedtime. 09/28/20   [name], MD  Multiple Vitamin (MULTIVITAMIN WITH MINERALS) TABS tablet Take 1 tablet by mouth daily. 09/29/20   [name], MD  Omega-3 Fatty Acids (FISH OIL PO) Take 1 capsule by mouth daily.    [provider]  pantoprazole (PROTONIX) 40 MG tablet Take 1 tablet (40 mg total) by  mouth 2 (two) times daily. 09/28/20   [name], MD  senna-docusate (SENOKOT-S) 8.6-50 MG tablet Take 1 tablet by mouth 2 (two) times daily. 09/28/20   [name], MD  warfarin (COUMADIN) 1 MG tablet Take 1 tablet (1 mg total) by mouth daily. Note do not take warfarin 9/10 or 9/11 and call warfarin clinical for plan on 9/12 09/29/20   [name], MD  warfarin (COUMADIN) 5 MG tablet Note, do not take warfarin 9/10 or 9/11 and call warfarin clinic for plan on 9/12. TAKE 1/2 TO 1 TABLET DAILY AS DIRECTED BY COUMADIN CLINIC. 09/29/20   [name], MD     Family History  Problem Relation Age of Onset   Heart disease Mother    CAD Mother    Migraines Mother    Heart failure Father    Skin cancer Father    Valvular heart disease Father        prolapsed valve   Tremor Father        "probably had a bit of tremor in his old age"   Heart failure Maternal Grandmother    Pneumonia Maternal Grandfather    Heart failure Paternal Grandmother    Stroke Paternal Grandfather    Asthma Daughter    Epilepsy Son    Parkinson's disease Neg Hx     Social History   Socioeconomic History   Marital status: Single    Spouse name: Not on file   Number of children: 2   Years of education: 16   Highest education level: Not on file  Occupational History   Occupation: [employer]  Tobacco Use   Smoking status: Never   Smokeless tobacco: Never   Tobacco comments:    only tried a few cigarettes when he was younger  Substance and Sexual Activity   Alcohol use: Yes    Comment: "probably a 6 pack of beer a year"   Drug use: No   Sexual activity: Not on file  Other Topics Concern   Not on file  Social History Narrative   Lives with girlfriend   Caffeine use: rarely    Right handed   Social Determinants of Health   Financial Resource Strain: Not on file  Food Insecurity: Not on file  Transportation Needs: Not on file  Physical Activity: Not on file   Stress: Not on file  Social Connections: Not on file    Review of Systems: A 12 point ROS discussed and pertinent positives are indicated in the HPI above.  All other systems are negative.    Vital Signs: BP 93/67 (BP Location: Right Arm)   Pulse 63   Resp 17   SpO2 98%   Physical Exam Vitals reviewed.  Cardiovascular:     Rate and Rhythm: Normal rate and regular rhythm.  Pulmonary:     Breath  sounds: Normal breath sounds.     Comments: trach Skin:    General: Skin is warm.  Psychiatric:     Comments: Spoke to Lizbeth Bark via phone    Imaging: CT ABDOMEN WO CONTRAST  Result Date: 10/06/2020 CLINICAL DATA:  Ileus and evaluation anatomy for possible gastrostomy tube placement. EXAM: CT ABDOMEN WITHOUT CONTRAST TECHNIQUE: Multidetector CT imaging of the abdomen was performed following the standard protocol without IV contrast. COMPARISON:  Prior CT of the abdomen on 09/22/2020 and recent abdominal plain films. FINDINGS: Lower chest: Left lower lobe consolidation and bronchial occlusion suggestive of aspiration pneumonia. Hepatobiliary: No focal liver abnormality is seen. No gallstones, gallbladder wall thickening, or biliary dilatation. Pancreas: Unremarkable. No pancreatic ductal dilatation or surrounding inflammatory changes. Spleen: Normal in size without focal abnormality. Adrenals/Urinary Tract: Adrenal glands are unremarkable. Kidneys are normal, without renal calculi, focal lesion, or hydronephrosis. Visualized superior bladder shows significant distension. Stomach/Bowel: Oral contrast is present in the colon. Improvement in ileus since the prior CT on 09/22/2020. A nasogastric tube extends into the body of the stomach. The stomach is fairly high in position but shows no abnormal positioning to preclude potential percutaneous gastrostomy tube placement. The transverse colon is located inferior to the stomach. No free intraperitoneal air identified. Vascular/Lymphatic: Calcified  plaque in the abdominal aorta without aneurysm. No enlarged lymph nodes identified. Other: No hiatal or abdominal wall hernias identified. No evidence of ascites or abscess. Musculoskeletal: No acute or significant osseous findings. IMPRESSION: 1. Improvement in ileus after days a gastric decompression. 2. No anatomic abnormalities that would preclude potential attempt at percutaneous gastrostomy tube placement. 3. Left lower lobe consolidation and bronchial occlusions suggestive of aspiration pneumonia. 4. Probable significant bladder distension based on visualized superior bladder. Correlation suggested with evidence of urinary retention clinically. Catheterization of the bladder may be helpful. Electronically Signed   By: Aletta Edouard M.D.   On: 10/06/2020 15:01   CT HEAD WO CONTRAST (5MM)  Result Date: 10/06/2020 CLINICAL DATA:  Mental status change, unknown cause. EXAM: CT HEAD WITHOUT CONTRAST TECHNIQUE: Contiguous axial images were obtained from the base of the skull through the vertex without intravenous contrast. COMPARISON:  09/21/2020 FINDINGS: Brain: There is no evidence of an acute infarct, intracranial hemorrhage, mass, midline shift, or extra-axial fluid collection. Hypodensities in the cerebral white matter bilaterally are similar to the prior CT and are nonspecific but compatible with mild chronic small vessel ischemic disease. Mild cerebral atrophy is within normal limits for age. Vascular: Calcified atherosclerosis at the skull base. No hyperdense vessel. Skull: Chronic appearing nasal bone fractures. Sinuses/Orbits: Paranasal sinuses and mastoid air cells are clear. Unremarkable orbits. Other: None. IMPRESSION: 1. No evidence of acute intracranial abnormality. 2. Mild chronic small vessel ischemic disease. Electronically Signed   By: Logan Bores M.D.   On: 10/06/2020 14:16   CT Angio Chest Pulmonary Embolism (PE) W or WO Contrast  Result Date: 10/25/2020 CLINICAL DATA:  Shortness of  breath. EXAM: CT ANGIOGRAPHY CHEST WITH CONTRAST TECHNIQUE: Multidetector CT imaging of the chest was performed using the standard protocol during bolus administration of intravenous contrast. Multiplanar CT image reconstructions and MIPs were obtained to evaluate the vascular anatomy. CONTRAST:  14mL OMNIPAQUE IOHEXOL 350 MG/ML SOLN COMPARISON:  09/18/2020 FINDINGS: Cardiovascular: The heart is normal in size. Mild enlargement of the left atrium. No pericardial effusion. Normal caliber thoracic aorta. Scattered atherosclerotic calcifications. Scattered coronary artery calcifications. The pulmonary arterial tree is well opacified. No filling defects to suggest pulmonary embolism. Mediastinum/Nodes:  No mediastinal or hilar mass or lymphadenopathy. Tracheostomy tube is in good position at the mid tracheal level. The esophagus is grossly normal. Lungs/Pleura: Dense left lower lobe airspace consolidation with fluid-filled bronchi and scattered calcifications. There is an associated moderate-sized left pleural effusion. Small right pleural effusion with overlying atelectasis. Patchy E ill-defined nodular ground-glass attenuation in the right lower lobe likely inflammation possible early/mild bronchopneumonia. Upper Abdomen: No significant upper abdominal findings. Feeding gastrostomy tube is noted stomach. Musculoskeletal: No significant bony findings. Remote thoracic compression fractures. Review of the MIP images confirms the above findings. IMPRESSION: 1. No CT findings for pulmonary embolism. 2. Normal caliber thoracic aorta. 3. Dense left lower lobe airspace consolidation with fluid-filled bronchi, possible aspiration. 4. Moderate-sized left pleural effusion and small right pleural effusion with overlying atelectasis. 5. Patchy ill-defined nodular ground-glass attenuation in the right lower lobe likely inflammation possible early/mild bronchopneumonia. 6. Tracheostomy tube in good position. Electronically Signed    By: Marijo Sanes M.D.   On: 10/25/2020 11:55   IR GASTROSTOMY TUBE MOD SED  Result Date: 10/18/2020 INDICATION: 80 year old with malnutrition. Gastrostomy tube needed for supplemental nutrition. EXAM: PERCUTANEOUS GASTROSTOMY TUBE WITH FLUOROSCOPIC GUIDANCE Physician: Stephan Minister. Anselm Pancoast, MD MEDICATIONS: Ancef 2 g; Antibiotics were administered within 1 hour of the procedure. Glucagon 0.5 mg ANESTHESIA/SEDATION: Versed 1.0 mg IV; Fentanyl 50 mcg IV Moderate Sedation Time:  45 minutes The patient was continuously monitored during the procedure by the interventional radiology nurse under my direct supervision. FLUOROSCOPY TIME:  Fluoroscopy Time: 9 minutes, 54 seconds, 27 mGy COMPLICATIONS: None immediate. PROCEDURE: Informed consent was obtained for a percutaneous gastrostomy tube. The patient was placed supine on the interventional table. Patient has endotracheal tube and nasogastric tube. Due to the endotracheal tube, we proceeded with a push through gastrostomy tube rather than a pull-through. The stomach was inflated using the nasogastric tube. The anterior abdomen was prepped and draped in sterile fashion. Maximal barrier sterile technique was utilized including caps, mask, sterile gowns, sterile gloves, sterile drape, hand hygiene and skin antiseptic. Stomach is identified with fluoroscopy. Anterior abdomen was anesthetized using 1% lidocaine. Small incision was made and a SAF-T-PEXT fastener was advanced in the stomach using fluoroscopy. A total of 3 T-fasteners were placed. An incision was made between the T-fasteners. Introducer needle was directed in the stomach using fluoroscopy. A superstiff Amplatz wire was advanced to the stomach. The tract was dilated using the serial dilator with a peel-away sheath. An 43 French balloon retention tube was advanced over the wire through the peel-away sheath. Peel-away sheath was removed. The balloon was inflated with 8 mL of saline. Contrast injection confirmed placement  in the stomach. Dressing was placed. FINDINGS: Gastrostomy tube is positioned in the stomach. Three T-fasteners with absorbable suture were placed. IMPRESSION: Successful fluoroscopic guided percutaneous gastrostomy tube placement. Electronically Signed   By: Markus Daft M.D.   On: 10/18/2020 11:00   DG CHEST PORT 1 VIEW  Result Date: 10/31/2020 CLINICAL DATA:  Ventilator dependent respiratory failure. EXAM: PORTABLE CHEST 1 VIEW COMPARISON:  10/29/2020 FINDINGS: The tracheostomy tube is stable. The cardiac silhouette, mediastinal and hilar contours are within normal limits and stable. Improved bibasilar lung aeration with less consolidation in the left lower lobe. No pleural effusions. No pneumothorax. IMPRESSION: Resolving left lower lobe airspace consolidation and improved bibasilar aeration. Electronically Signed   By: Marijo Sanes M.D.   On: 10/31/2020 05:55   DG CHEST PORT 1 VIEW  Result Date: 10/29/2020 CLINICAL DATA:  80 year old male with  left pleural effusion, lower lobe collapse or consolidation. EXAM: PORTABLE CHEST 1 VIEW COMPARISON:  Portable chest 10/28/2020 and earlier. FINDINGS: Portable AP semi upright view at 0553 hours. Stable tracheostomy. Stable lung volumes and mediastinal contours. Substantially improved left lung ventilation since 10/08/202, 2 and mildly improved lung volumes and upper lobe ventilation since yesterday. Residual veiling and confluent opacity left lower lung opacity not significantly changed from yesterday. Stable right lung. Stable visible bowel gas and osseous structures. IMPRESSION: Improved lung volumes and upper lobe ventilation since yesterday with otherwise stable residual left pleural effusion and lower lobe collapse or consolidation. Electronically Signed   By: Genevie Ann M.D.   On: 10/29/2020 06:21   DG CHEST PORT 1 VIEW  Result Date: 10/28/2020 CLINICAL DATA:  80 year old male with history of atelectasis and pleural effusion. EXAM: PORTABLE CHEST 1 VIEW  COMPARISON:  Chest x-ray 10/27/2020. FINDINGS: A tracheostomy tube is in place with tip 5.2 cm above the carina. Significantly improved aeration in the left hemithorax, compatible with resolving areas of atelectasis and/or consolidation, and likely decreasing left pleural effusion. Right lung appears clear. No right pleural effusion. No pneumothorax. No evidence of pulmonary edema. Heart size is normal. The patient is rotated to the left on today's exam, resulting in distortion of the mediastinal contours and reduced diagnostic sensitivity and specificity for mediastinal pathology. Atherosclerotic calcifications in the thoracic aorta. Status post median sternotomy. IMPRESSION: 1. Improving aeration in the left lung likely reflective of resolving atelectasis and/or consolidation, and decreasing left pleural effusion. 2. Aortic atherosclerosis. Electronically Signed   By: Vinnie Langton M.D.   On: 10/28/2020 07:36   DG Chest Port 1 View  Result Date: 10/27/2020 CLINICAL DATA:  80 year old male with history of pneumonia. EXAM: PORTABLE CHEST 1 VIEW COMPARISON:  Chest x-ray 10/18/2020.  Chest CTA 10/25/2020. FINDINGS: A tracheostomy tube is in place with tip 5.0 cm above the carina. Complete opacification in the left hemithorax. Right lung appears clear. No right pleural effusion. No pneumothorax. Cardiac and mediastinal contours are obscured. Elevation of the left hemidiaphragm. Status post median sternotomy. IMPRESSION: 1. Support apparatus, as above. 2. Complete opacification in the left hemithorax with some elevation of the left hemidiaphragm. The presence of significant left-sided atelectasis is strongly suspected, although underlying airspace consolidation and/or large pleural effusion are also likely present. Given the rapid change compared to the recent chest CT, extensive mucous plugging is considered a likely cause. Electronically Signed   By: Vinnie Langton M.D.   On: 10/27/2020 07:31   DG CHEST  PORT 1 VIEW  Result Date: 10/18/2020 CLINICAL DATA:  80 year old male intubated. Lung base opacity. EXAM: PORTABLE CHEST 1 VIEW COMPARISON:  Portable chest 10/15/2020 and earlier. FINDINGS: Portable AP semi upright view at 0607 hours. ET tube cuff might be overinflated at the thoracic inlet. Tip position is satisfactory below the clavicles. Enteric tube courses to the abdomen, side hole at the level of the gastric fundus. Stable lung volumes. Mediastinal contours remain within normal limits. Prior sternotomy. Mild bilateral lung base veiling opacity continues, with small pleural effusions, demonstrated by CT recently. Left retrocardiac ventilation appears mildly improved from the prior. No pneumothorax, pulmonary edema or air bronchograms. Negative visible bowel gas. Osteopenia. IMPRESSION: 1. ET tube cuff might be overinflated at the thoracic inlet. 2.  Stable lines and tubes. 3. Improved left lung base ventilation since 10/15/2020. Probable ongoing small pleural effusions. No new cardiopulmonary abnormality. Electronically Signed   By: Genevie Ann M.D.   On: 10/18/2020 06:39  DG Chest Port 1 View  Result Date: 10/15/2020 CLINICAL DATA:  Pneumonia, ventilator dependent. EXAM: PORTABLE CHEST 1 VIEW COMPARISON:  10/15/2020 and CT chest 09/18/2020. FINDINGS: Endotracheal tube terminates 3.9 cm above the carina. Nasogastric tube is followed into the stomach with the tip projecting beyond the inferior margin of the image. Heart size normal. Similar bibasilar airspace opacification. No definite pleural fluid. Biapical pleural thickening. IMPRESSION: Similar bibasilar airspace opacification, likely due to pneumonia. Electronically Signed   By: Lorin Picket M.D.   On: 10/15/2020 13:00   DG Chest Port 1 View  Result Date: 10/15/2020 CLINICAL DATA:  80 year old male with respiratory failure, intubated. EXAM: PORTABLE CHEST 1 VIEW COMPARISON:  Portable chest 10/12/2020 and earlier. FINDINGS: Portable AP semi upright  view at 0543 hours. Stable lines and tubes. Mediastinal contours are stable and within normal limits. Stable lung volumes with a mild degree of pulmonary hyperinflation. Skin fold artifact over the left upper lung. No pneumothorax, pulmonary edema. Small bilateral pleural effusions seen by CT 917 22 May persist. Confluent left lower lobe, retrocardiac opacity seen at that time persists. No other confluent opacity. Negative visible bowel gas. Stable visualized osseous structures. Thoracolumbar junction region compression fractures. IMPRESSION: 1.  Stable lines and tubes. 2. Left lower lobe (retrocardiac) pneumonia and small pleural effusions not significantly changed since the CT on 10/06/2020. Electronically Signed   By: Genevie Ann M.D.   On: 10/15/2020 06:03   DG CHEST PORT 1 VIEW  Result Date: 10/13/2020 CLINICAL DATA:  Intubation. EXAM: PORTABLE CHEST 1 VIEW COMPARISON:  Radiograph 10/10/2020 FINDINGS: Endotracheal tube tip is at the level of the clavicular heads. Enteric tube tip and side-port below the diaphragm in the stomach. Improved lung volumes from prior exam. Post median sternotomy. The heart is normal in size. Multiple skin folds project over both hemi thoraces. Improving retrocardiac opacity, streaky right lung base opacity is unchanged. Small bilateral pleural effusions, increased on the left. No pneumothorax. IMPRESSION: 1. Endotracheal tube tip at the level of the clavicular heads. Enteric tube tip and side-port below the diaphragm in the stomach. 2. Improving retrocardiac opacity. Stable streaky right lung base opacity. 3. Small bilateral pleural effusions, increased on the left. Electronically Signed   By: Keith Rake M.D.   On: 10/13/2020 00:00   DG Chest Port 1 View  Result Date: 10/10/2020 CLINICAL DATA:  Respiratory distress.  Shortness of breath today. EXAM: PORTABLE CHEST 1 VIEW COMPARISON:  Radiograph 10/04/2020.  CT 09/18/2020 FINDINGS: Patient is significantly rotated limiting  assessment. Enteric tube is in place with tip and side-port below the diaphragm. Post median sternotomy. Heart is normal in size. Patchy right infrahilar opacity, new. Unchanged retrocardiac left lung base opacity. Multiple skin folds project over the left hemithorax. No pulmonary edema. Small right pleural effusion. No definite left pleural effusion. There is gaseous gastric distention in the upper abdomen. Mild gaseous colonic distention is partially included. IMPRESSION: 1. Patchy right infrahilar opacity may represent atelectasis or pneumonia, new from prior exam. Stable left basilar consolidation. 2. Small right pleural effusion. 3. Gaseous gastric distention in the upper abdomen, despite enteric tube in place. Electronically Signed   By: Keith Rake M.D.   On: 10/10/2020 19:29   DG Chest Port 1 View  Result Date: 10/04/2020 CLINICAL DATA:  Aspiration. EXAM: PORTABLE CHEST 1 VIEW COMPARISON:  10/03/2020 FINDINGS: Lungs are hyperexpanded. Skin folds overlie the left chest with probable left base atelectasis or infiltrate. No pulmonary edema or substantial pleural effusion. The cardiopericardial silhouette  is within normal limits for size. Bones are diffusely demineralized. Telemetry leads overlie the chest. IMPRESSION: Hyperexpansion with probable left base atelectasis or infiltrate. Multiple skin folds overlie the left chest. Electronically Signed   By: Misty Stanley M.D.   On: 10/04/2020 12:38   DG Chest Port 1 View  Result Date: 10/03/2020 CLINICAL DATA:  Altered mental status.  Shortness of breath. EXAM: PORTABLE CHEST 1 VIEW COMPARISON:  10/01/2020 FINDINGS: 1224 hours. The lungs are clear without focal pneumonia, edema, pneumothorax or pleural effusion. Interstitial markings are diffusely coarsened with chronic features. The cardiopericardial silhouette is within normal limits for size. Bones are diffusely demineralized. Telemetry leads overlie the chest. Stomach is markedly distended with  gas. Bowel distention also noted in the visualized upper abdomen. IMPRESSION: 1. No acute cardiopulmonary findings. 2. Marked gaseous distention of the stomach. Electronically Signed   By: Misty Stanley M.D.   On: 10/03/2020 13:00   DG Abd Portable 1V  Result Date: 10/12/2020 CLINICAL DATA:  Ileus. EXAM: PORTABLE ABDOMEN - 1 VIEW COMPARISON:  October 04, 2020. FINDINGS: The bowel gas pattern is normal. No radio-opaque calculi or other significant radiographic abnormality are seen. Nasogastric tube tip is seen in proximal stomach. IMPRESSION: No abnormal bowel dilatation. Nasogastric tube tip seen in proximal stomach. Electronically Signed   By: Marijo Conception M.D.   On: 10/12/2020 13:40   DG Abd Portable 1V  Result Date: 10/04/2020 CLINICAL DATA:  NG tube placement. EXAM: PORTABLE ABDOMEN - 1 VIEW COMPARISON:  09/30/2020 FINDINGS: A nasogastric tube is seen with the tip within the proximal stomach. Generalized gaseous distention of small bowel and colon shows no significant change. IMPRESSION: Nasogastric tube tip in proximal stomach. Electronically Signed   By: Marlaine Hind M.D.   On: 10/04/2020 18:23   VAS [place] LOWER EXTREMITY VENOUS (DVT)  Result Date: 10/21/2020  Lower Venous DVT Study Patient Name:  Austin Davis  Date of Exam:   10/19/2020 Medical Rec #: [id number]         Accession #:    [id number] Date of Birth: December 10, 1940        Patient Gender: M Patient Age:   60 years Exam Location:  [facility] Procedure:      VAS [place] LOWER EXTREMITY VENOUS (DVT) Referring Phys: CHUN LI --------------------------------------------------------------------------------  Indications: Swelling.  Anticoagulation: Coumadin for A-fib recently held for procedure. Comparison Study: No prior studies available. Performing Technologist: Darlin Coco RDMS, RVT  Examination Guidelines: A complete evaluation includes B-mode imaging, spectral Doppler, color Doppler, and power Doppler as needed of all accessible  portions of each vessel. Bilateral testing is considered an integral part of a complete examination. Limited examinations for reoccurring indications may be performed as noted. The reflux portion of the exam is performed with the patient in reverse Trendelenburg.  +---------+---------------+---------+-----------+----------+--------------+ RIGHT    CompressibilityPhasicitySpontaneityPropertiesThrombus Aging +---------+---------------+---------+-----------+----------+--------------+ CFV      Full           Yes      Yes                                 +---------+---------------+---------+-----------+----------+--------------+ SFJ      Partial                                                     +---------+---------------+---------+-----------+----------+--------------+  FV Prox  Full                                                        +---------+---------------+---------+-----------+----------+--------------+ FV Mid   Full                                                        +---------+---------------+---------+-----------+----------+--------------+ FV DistalFull                                                        +---------+---------------+---------+-----------+----------+--------------+ PFV      Full                                                        +---------+---------------+---------+-----------+----------+--------------+ POP      Full           Yes      Yes                                 +---------+---------------+---------+-----------+----------+--------------+ PTV      Full                                                        +---------+---------------+---------+-----------+----------+--------------+ PERO     Full                                                        +---------+---------------+---------+-----------+----------+--------------+ GSV      Partial        Yes      Yes                  Acute           +---------+---------------+---------+-----------+----------+--------------+   +---------+---------------+---------+-----------+---------------+--------------+ LEFT     CompressibilityPhasicitySpontaneityProperties     Thrombus Aging +---------+---------------+---------+-----------+---------------+--------------+ CFV      Partial        Yes      Yes        Mobile, poorly Acute                                                      attached  thrombus                      +---------+---------------+---------+-----------+---------------+--------------+ SFJ      Partial                                                          +---------+---------------+---------+-----------+---------------+--------------+ FV Prox  Partial                                                          +---------+---------------+---------+-----------+---------------+--------------+ FV Mid   Partial        Yes      Yes                       Acute          +---------+---------------+---------+-----------+---------------+--------------+ FV Distal               Yes      Yes                                      +---------+---------------+---------+-----------+---------------+--------------+ PFV                     Yes      Yes                                      +---------+---------------+---------+-----------+---------------+--------------+ POP                     Yes      Yes                                      +---------+---------------+---------+-----------+---------------+--------------+ PTV      Partial        Yes      Yes                       Age                                                                       Indeterminate  +---------+---------------+---------+-----------+---------------+--------------+ PERO                                                       Not well  visualized     +---------+---------------+---------+-----------+---------------+--------------+     Summary: RIGHT: - Findings consistent with acute superficial vein thrombosis involving the right great saphenous vein. - No cystic structure found in the popliteal fossa.  LEFT: - Findings consistent with acute deep vein thrombosis involving the left common femoral vein, SF junction, and left femoral vein. - No cystic structure found in the popliteal fossa.  *See table(s) above for measurements and observations. Electronically signed by Harold Barban MD on 10/21/2020 at 9:29:47 AM.    Final    VAS [place] UPPER EXTREMITY VENOUS DUPLEX  Result Date: 10/25/2020 UPPER VENOUS STUDY  Patient Name:  Austin Davis  Date of Exam:   10/25/2020 Medical Rec #: [id number]         Accession #:    [id number] Date of Birth: [birth year]        Patient Gender: M Patient Age:   60 years Exam Location:  [facility] Procedure:      VAS [place] UPPER EXTREMITY VENOUS DUPLEX Referring Phys: Edwena Blow VARGHESE --------------------------------------------------------------------------------  Indications: Swelling Limitations: Bandages, line and patient positioning, patient movement, wrist restraints. Comparison Study: 10/19/2020 - RIGHT:                   - Findings consistent with acute superficial vein thrombosis                   involving the                   right great saphenous vein.                   - No cystic structure found in the popliteal fossa.                    LEFT:                   - Findings consistent with acute deep vein thrombosis                   involving the left                   common femoral vein, SF junction, and left femoral vein.                   - No cystic structure found in the popliteal fossa. Performing Technologist: Oliver Hum RVT  Examination Guidelines: A complete evaluation includes B-mode imaging, spectral Doppler, color Doppler, and  power Doppler as needed of all accessible portions of each vessel. Bilateral testing is considered an integral part of a complete examination. Limited examinations for reoccurring indications may be performed as noted.  Right Findings: +----------+------------+---------+-----------+----------+-------+ RIGHT     CompressiblePhasicitySpontaneousPropertiesSummary +----------+------------+---------+-----------+----------+-------+ IJV           Full       Yes       Yes                      +----------+------------+---------+-----------+----------+-------+ Subclavian    Full       Yes       Yes                      +----------+------------+---------+-----------+----------+-------+ Axillary      Full       Yes       Yes                      +----------+------------+---------+-----------+----------+-------+  Brachial      Full       Yes       Yes                      +----------+------------+---------+-----------+----------+-------+ Radial        Full                                          +----------+------------+---------+-----------+----------+-------+ Ulnar         Full                                          +----------+------------+---------+-----------+----------+-------+ Cephalic      Full                                          +----------+------------+---------+-----------+----------+-------+ Basilic       Full                                          +----------+------------+---------+-----------+----------+-------+  Left Findings: +----------+------------+---------+-----------+----------+-------+ LEFT      CompressiblePhasicitySpontaneousPropertiesSummary +----------+------------+---------+-----------+----------+-------+ Subclavian    Full       Yes       Yes                      +----------+------------+---------+-----------+----------+-------+  Summary:  Right: No evidence of deep vein thrombosis in the upper extremity. No evidence  of superficial vein thrombosis in the upper extremity.  Left: No evidence of thrombosis in the subclavian.  *See table(s) above for measurements and observations.  Diagnosing physician: Jamelle Haring Electronically signed by Jamelle Haring on 10/25/2020 at 2:23:31 PM.    Final     Labs:  CBC: Recent Labs    10/29/20 1509 10/30/20 0543 10/31/20 0334 11/01/20 0059  WBC 7.5 6.5 4.8 6.3  HGB 9.4* 8.4* 7.6* 8.1*  HCT 29.3* 25.6* 24.0* 25.0*  PLT 321 273 249 262    COAGS: Recent Labs    10/17/20 0329 10/19/20 1640 10/25/20 0309 10/26/20 1514 10/29/20 1509  INR 1.0 1.1  --  1.0 1.1  APTT  --  34 30 24 30     BMP: Recent Labs    10/29/20 0444 10/30/20 0543 10/31/20 0334 11/01/20 0059  NA 135 134* 133* 135  K 3.7 3.8 3.5 3.5  CL 93* 91* 89* 89*  CO2 36* 38* 40* 38*  GLUCOSE 120* 117* 112* 135*  BUN 19 20 20 22   CALCIUM 8.2* 8.2* 8.2* 8.1*  CREATININE 0.43* 0.42* 0.40* 0.39*  GFRNONAA >60 >60 >60 >60    LIVER FUNCTION TESTS: Recent Labs    09/16/20 1512 09/18/20 0235 09/20/20 0348 09/23/20 0259 09/30/20 0408 10/08/20 0959 10/12/20 1321 10/16/20 0632 10/25/20 0309 10/28/20 0342  BILITOT 1.4* 0.9 1.5*  --  0.8  --   --   --   --   --   AST 34 25 23  --  22  --   --   --   --   --   ALT 43 35 31  --  45*  --   --   --   --   --  ALKPHOS 46 37* 43  --  51  --   --   --   --   --   PROT 6.7 4.9* 5.6*  --  5.2*  --   --   --   --   --   ALBUMIN 4.4 3.2* 3.5   < > 3.0*   < > 2.4* 1.9* 1.7* 2.0*   < > = values in this interval not displayed.    TUMOR MARKERS: No results for input(s): AFPTM, CEA, CA199, CHROMGRNA in the last 8760 hours.  Assessment and Plan:  LLE DVT 10/19/20 Treating with Heparin-- but developed trach site bleeding and hematuria Now IVC filter placement requested  Spoke to Dtr [name] about procedure She wants to speak to MD and father before consent  Will revisit later for decision  Thank you for this interesting consult.  I greatly  enjoyed meeting Austin Davis and look forward to participating in their care.  A copy of this report was sent to the requesting provider on this date.  Electronically Signed: Lavonia Drafts, PA-C 11/01/2020, 11:01 AM   I spent a total of 20 Minutes    in face to face in clinical consultation, greater than 50% of which was counseling/coordinating care for IVC filter placement

## 2020-11-01 NOTE — Procedures (Signed)
  Procedure: IVCgram and filter placement (retrievable) EBL:   minimal Complications:  none immediate  See full dictation in [facility].  Dillard Cannon MD Main # [phone] Pager  [phone]

## 2020-11-02 LAB — BASIC METABOLIC PANEL
Anion gap: 6 (ref 5–15)
BUN: 21 mg/dL (ref 8–23)
CO2: 36 mmol/L — ABNORMAL HIGH (ref 22–32)
Calcium: 8.5 mg/dL — ABNORMAL LOW (ref 8.9–10.3)
Chloride: 92 mmol/L — ABNORMAL LOW (ref 98–111)
Creatinine, Ser: 0.37 mg/dL — ABNORMAL LOW (ref 0.61–1.24)
GFR, Estimated: >60 mL/min (ref >60)
Glucose, Bld: 122 mg/dL — ABNORMAL HIGH (ref 70–99)
Potassium: 3.9 mmol/L (ref 3.5–5.1)
Sodium: 134 mmol/L — ABNORMAL LOW (ref 135–145)

## 2020-11-02 LAB — CBC
HCT: 26.8 % — ABNORMAL LOW (ref 39.0–52.0)
Hemoglobin: 8.8 g/dL — ABNORMAL LOW (ref 13.0–17.0)
MCH: 33.2 pg (ref 26.0–34.0)
MCHC: 32.8 g/dL (ref 30.0–36.0)
MCV: 101.1 fL — ABNORMAL HIGH (ref 80.0–100.0)
Platelets: 284 10*3/uL (ref 150–400)
RBC: 2.65 MIL/uL — ABNORMAL LOW (ref 4.22–5.81)
RDW: 17.1 % — ABNORMAL HIGH (ref 11.5–15.5)
WBC: 6.8 10*3/uL (ref 4.0–10.5)
nRBC: 0.4 % — ABNORMAL HIGH (ref 0.0–0.2)

## 2020-11-02 LAB — MAGNESIUM: Magnesium: 2.3 mg/dL (ref 1.7–2.4)

## 2020-11-02 LAB — PHOSPHORUS: Phosphorus: 3.1 mg/dL (ref 2.5–4.6)

## 2020-11-02 NOTE — Progress Notes (Signed)
Pulmonary Critical Care Medicine [place]  PROGRESS NOTE     Austin Davis  XBD:[id number]  DOB: [birth year]   DOA: 09/29/2020  Referring Physician: Satira Sark, MD  HPI: Austin Davis is a 80 y.o. male being followed for ventilator/airway/oxygen weaning Acute on Chronic Respiratory Failure.  Patient is weaning on protocol supposed to do 2 hours of T collar today  Medications: Reviewed on Rounds  Physical Exam:  Vitals: Temperature 97.6 pulse 78 respiratory 18 blood pressure is 94/45 saturations 97%  Ventilator Settings on assist control FiO2 28% tidal volume 420 PEEP 5  General: Comfortable at this time Neck: supple Cardiovascular: no malignant arrhythmias Respiratory: No rhonchi very coarse breath sounds Skin: no rash seen on limited exam Musculoskeletal: No gross abnormality Psychiatric:unable to assess Neurologic:no involuntary movements         Lab Data:   Basic Metabolic Panel: Recent Labs  Lab 10/28/20 0342 10/29/20 0444 10/30/20 0543 10/31/20 0334 11/01/20 0059 11/02/20 0440  NA 133* 135 134* 133* 135 134*  K 3.8 3.7 3.8 3.5 3.5 3.9  CL 93* 93* 91* 89* 89* 92*  CO2 34* 36* 38* 40* 38* 36*  GLUCOSE 126* 120* 117* 112* 135* 122*  BUN 15 19 20 20 22 21   CREATININE 0.48* 0.43* 0.42* 0.40* 0.39* 0.37*  CALCIUM 8.1* 8.2* 8.2* 8.2* 8.1* 8.5*  MG 2.1  --  2.1 2.2 2.2 2.3  PHOS 3.5  --  2.3* 2.3* 3.3 3.1    ABG: No results for input(s): PHART, PCO2ART, PO2ART, HCO3, O2SAT in the last 168 hours.  Liver Function Tests: Recent Labs  Lab 10/28/20 0342  ALBUMIN 2.0*   No results for input(s): LIPASE, AMYLASE in the last 168 hours. No results for input(s): AMMONIA in the last 168 hours.  CBC: Recent Labs  Lab 10/29/20 1509 10/30/20 0543 10/31/20 0334 11/01/20 0059 11/02/20 0440  WBC 7.5 6.5 4.8 6.3 6.8  HGB 9.4* 8.4* 7.6* 8.1* 8.8*  HCT 29.3* 25.6* 24.0* 25.0* 26.8*  MCV 102.1*  101.6* 104.3* 102.9* 101.1*  PLT 321 273 249 262 284    Cardiac Enzymes: No results for input(s): CKTOTAL, CKMB, CKMBINDEX, TROPONINI in the last 168 hours.  BNP (last 3 results) Recent Labs    09/19/20 1606  BNP 876.9*    ProBNP (last 3 results) No results for input(s): PROBNP in the last 8760 hours.  Radiological Exams: IR IVC FILTER PLMT / S&I Burke Keels GUID/MOD SED  Result Date: 11/01/2020 EXAM: INFERIOR VENA CAVA (IVC) FILTER INSERTION Procedural Personnel Attending physician(s): D Hassell Fellow physician(s): None Resident physician(s): None Advanced practice provider(s): None Pre-procedure diagnosis: Left lower extremity DVT Post-procedure diagnosis: Same Indication: Venous thromboembolism and complication of anticoagulation/Venous thromboembolism with limited cardiopulmonary reserve Additional clinical history: None Complications: No immediate complications. IMPRESSION: Insertion of inferior vena cava filter in the infrarenal IVC. PLAN: Retrieval intent (MIPS): The filter is potentially retrievable. PROCEDURE: SUMMARY IVC filter insertion under fluoroscopic guidance Additional procedure(s): None PROCEDURE DETAILS Pre-procedure Consent: Informed consent for the procedure including risks, benefits and alternatives was obtained and time-out was performed prior to the procedure. Preparation: The site was prepared and draped using maximal sterile barrier technique including cutaneous antisepsis. Anesthesia/sedation Level of anesthesia/sedation: None Anesthesia/sedation administered by: Not applicable Total intra-service sedation time (minutes): Not applicable Access Local anesthesia was administered. The vessel was sonographically evaluated and determined to be patent. Real time ultrasound was used to visualize needle entry into the vessel and a  permanent image access was stored. Vein accessed: Right internal jugular vein Access technique: Micropuncture set with 21 gauge needle Venography  Venography of the inferior vena cava (IVC) was performed to evaluate IVC size, anatomy and patency. Catheter tip position for venography: Left common iliac vein Venography findings: No caval thrombus, normal caliber. Filter placement The filter delivery sheath was advanced and the filter was deployed under fluoroscopic guidance. Filter placed: Infrarenal IVC at the L3 level Post-placement imaging Post-placement imaging technique: IVC venography Filter position: Infrarenal IVC Additional findings: None Closure The sheath was removed and hemostasis was achieved with manual compression. A sterile dressing was applied. Contrast Contrast agent: Omnipaque 300 Contrast volume (mL): 20 Radiation Dose Fluoroscopy time: 54 seconds Reference air kerma: 14.3 mGy Kerma area product: Not provided by imaging equipment Additional Details Additional description of procedure: None Equipment details: [place] retrievable filter Specimens removed: None Estimated blood loss (mL): Less than 10 Attestation Signer name: Lucrezia Europe I attest that I was present for the entire procedure and I reviewed the stored images and agree with the report as written. Electronically Signed   By: Lucrezia Europe M.D.   On: 11/01/2020 16:43    Assessment/Plan Active Problems:   Acute on chronic respiratory failure with hypoxia and hypercapnia (HCC)   AF (paroxysmal atrial fibrillation) (HCC)   Obstructive sleep apnea   COPD, severe (HCC)   Chronic heart failure with preserved ejection fraction (HCC)   Acute on chronic respiratory failure with hypoxia the plan is to try for T collar today for 2 hours. Atrial fibrillation right now rate is controlled Severe COPD medical management Chronic heart failure preserved ejection fraction no change Obstructive sleep apnea nonissue at this time   I have personally seen and evaluated the patient, evaluated laboratory and imaging results, formulated the assessment and plan and placed orders. The Patient  requires high complexity decision making with multiple systems involvement.  Rounds were done with the Respiratory Therapy Director and Staff therapists and discussed with nursing staff also.  Allyne Gee, MD [facility] Pulmonary Critical Care Medicine Sleep Medicine

## 2020-11-03 ENCOUNTER — Other Ambulatory Visit (HOSPITAL_COMMUNITY): Payer: Medicare Other

## 2020-11-03 LAB — OCCULT BLOOD X 1 CARD TO LAB, STOOL: Fecal Occult Bld: NEGATIVE

## 2020-11-03 IMAGING — DX DG CHEST 1V PORT
1 series · 1 of 1 positions shown · non-contrast
Comparison: Chest radiograph [DATE]

CLINICAL DATA: Pneumonia, pleural effusion

EXAM:
PORTABLE CHEST 1 VIEW

[chest ap]
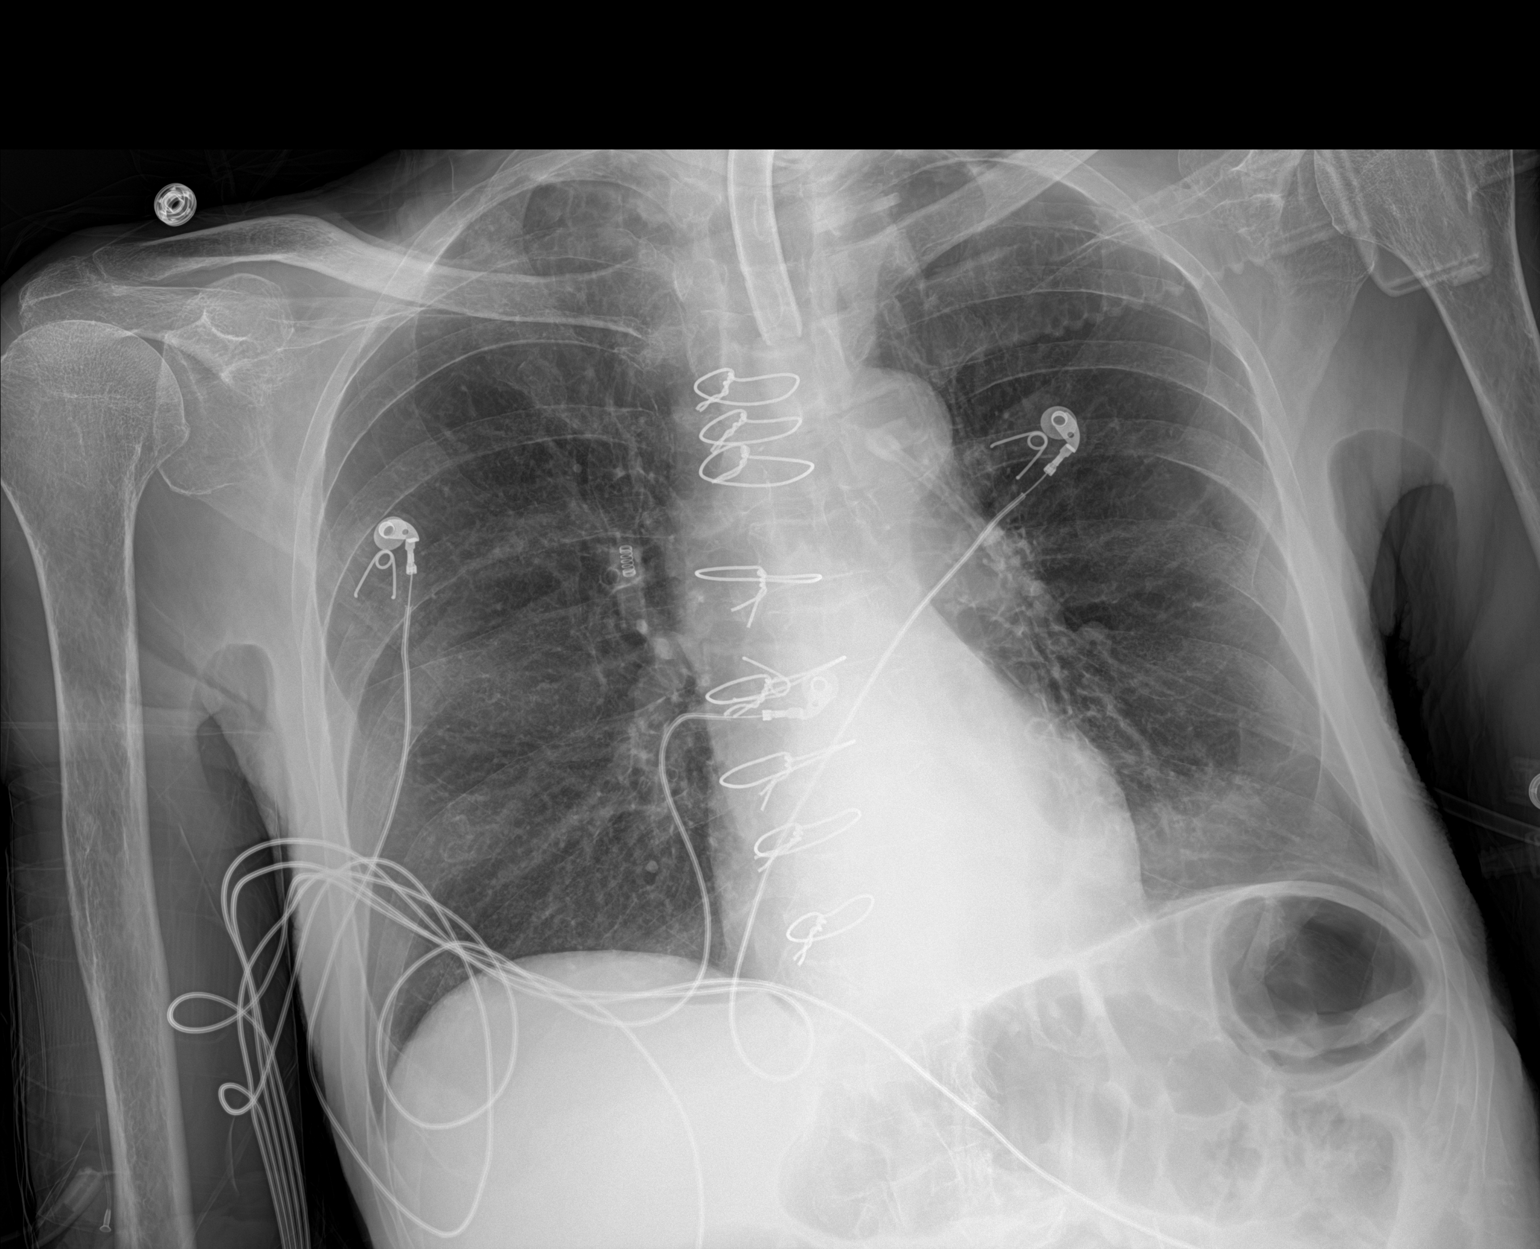

[1 of 1 positions shown; findings below may reference images not displayed]

FINDINGS: The tracheostomy tube is stable. Median sternotomy wires are stable.

The cardiomediastinal silhouette is stable.

Hazy opacity in the left base is similar to the prior study. Linear
opacities in the lateral left base likely reflect atelectasis. There
is no new or worsening airspace disease. There is no pleural
effusion. There is no pneumothorax.

The bones are stable.
IMPRESSION: Unchanged hazy opacity in the left base. No new or worsening
airspace disease.

## 2020-11-03 NOTE — Progress Notes (Signed)
Pulmonary Critical Care Medicine [place]  PROGRESS NOTE     Austin Davis  QIH:[id number]  DOB: [birth year]   DOA: 09/29/2020  Referring Physician: Satira Sark, MD  HPI: Austin Davis is a 80 y.o. male being followed for ventilator/airway/oxygen weaning Acute on Chronic Respiratory Failure.  On T collar currently has been on 20% FiO2 goal of 4 hours  Medications: Reviewed on Rounds  Physical Exam:  Vitals: Temperature is 97.3 pulse 70 respiratory 20 blood pressure is 109/76 saturations 99%  Ventilator Settings off the ventilator on T collar  General: Comfortable at this time Neck: supple Cardiovascular: no malignant arrhythmias Respiratory: No rhonchi very coarse breath sounds Skin: no rash seen on limited exam Musculoskeletal: No gross abnormality Psychiatric:unable to assess Neurologic:no involuntary movements         Lab Data:   Basic Metabolic Panel: Recent Labs  Lab 10/28/20 0342 10/29/20 0444 10/30/20 0543 10/31/20 0334 11/01/20 0059 11/02/20 0440  NA 133* 135 134* 133* 135 134*  K 3.8 3.7 3.8 3.5 3.5 3.9  CL 93* 93* 91* 89* 89* 92*  CO2 34* 36* 38* 40* 38* 36*  GLUCOSE 126* 120* 117* 112* 135* 122*  BUN 15 19 20 20 22 21   CREATININE 0.48* 0.43* 0.42* 0.40* 0.39* 0.37*  CALCIUM 8.1* 8.2* 8.2* 8.2* 8.1* 8.5*  MG 2.1  --  2.1 2.2 2.2 2.3  PHOS 3.5  --  2.3* 2.3* 3.3 3.1    ABG: No results for input(s): PHART, PCO2ART, PO2ART, HCO3, O2SAT in the last 168 hours.  Liver Function Tests: Recent Labs  Lab 10/28/20 0342  ALBUMIN 2.0*   No results for input(s): LIPASE, AMYLASE in the last 168 hours. No results for input(s): AMMONIA in the last 168 hours.  CBC: Recent Labs  Lab 10/29/20 1509 10/30/20 0543 10/31/20 0334 11/01/20 0059 11/02/20 0440  WBC 7.5 6.5 4.8 6.3 6.8  HGB 9.4* 8.4* 7.6* 8.1* 8.8*  HCT 29.3* 25.6* 24.0* 25.0* 26.8*  MCV 102.1* 101.6* 104.3* 102.9* 101.1*   PLT 321 273 249 262 284    Cardiac Enzymes: No results for input(s): CKTOTAL, CKMB, CKMBINDEX, TROPONINI in the last 168 hours.  BNP (last 3 results) Recent Labs    09/19/20 1606  BNP 876.9*    ProBNP (last 3 results) No results for input(s): PROBNP in the last 8760 hours.  Radiological Exams: IR IVC FILTER PLMT / S&I Burke Keels GUID/MOD SED  Result Date: 11/01/2020 EXAM: INFERIOR VENA CAVA (IVC) FILTER INSERTION Procedural Personnel Attending physician(s): D Hassell Fellow physician(s): None Resident physician(s): None Advanced practice provider(s): None Pre-procedure diagnosis: Left lower extremity DVT Post-procedure diagnosis: Same Indication: Venous thromboembolism and complication of anticoagulation/Venous thromboembolism with limited cardiopulmonary reserve Additional clinical history: None Complications: No immediate complications. IMPRESSION: Insertion of inferior vena cava filter in the infrarenal IVC. PLAN: Retrieval intent (MIPS): The filter is potentially retrievable. PROCEDURE: SUMMARY IVC filter insertion under fluoroscopic guidance Additional procedure(s): None PROCEDURE DETAILS Pre-procedure Consent: Informed consent for the procedure including risks, benefits and alternatives was obtained and time-out was performed prior to the procedure. Preparation: The site was prepared and draped using maximal sterile barrier technique including cutaneous antisepsis. Anesthesia/sedation Level of anesthesia/sedation: None Anesthesia/sedation administered by: Not applicable Total intra-service sedation time (minutes): Not applicable Access Local anesthesia was administered. The vessel was sonographically evaluated and determined to be patent. Real time ultrasound was used to visualize needle entry into the vessel and a permanent image access was  stored. Vein accessed: Right internal jugular vein Access technique: Micropuncture set with 21 gauge needle Venography Venography of the inferior vena cava  (IVC) was performed to evaluate IVC size, anatomy and patency. Catheter tip position for venography: Left common iliac vein Venography findings: No caval thrombus, normal caliber. Filter placement The filter delivery sheath was advanced and the filter was deployed under fluoroscopic guidance. Filter placed: Infrarenal IVC at the L3 level Post-placement imaging Post-placement imaging technique: IVC venography Filter position: Infrarenal IVC Additional findings: None Closure The sheath was removed and hemostasis was achieved with manual compression. A sterile dressing was applied. Contrast Contrast agent: Omnipaque 300 Contrast volume (mL): 20 Radiation Dose Fluoroscopy time: 54 seconds Reference air kerma: 14.3 mGy Kerma area product: Not provided by imaging equipment Additional Details Additional description of procedure: None Equipment details: [place] retrievable filter Specimens removed: None Estimated blood loss (mL): Less than 10 Attestation Signer name: Lucrezia Europe I attest that I was present for the entire procedure and I reviewed the stored images and agree with the report as written. Electronically Signed   By: Lucrezia Europe M.D.   On: 11/01/2020 16:43   DG Chest Port 1 View  Result Date: 11/03/2020 CLINICAL DATA:  Pneumonia, pleural effusion EXAM: PORTABLE CHEST 1 VIEW COMPARISON:  Chest radiograph 10/31/2020 FINDINGS: The tracheostomy tube is stable. Median sternotomy wires are stable. The cardiomediastinal silhouette is stable. Hazy opacity in the left base is similar to the prior study. Linear opacities in the lateral left base likely reflect atelectasis. There is no new or worsening airspace disease. There is no pleural effusion. There is no pneumothorax. The bones are stable. IMPRESSION: Unchanged hazy opacity in the left base. No new or worsening airspace disease. Electronically Signed   By: Valetta Mole M.D.   On: 11/03/2020 08:43    Assessment/Plan Active Problems:   Acute on chronic respiratory  failure with hypoxia and hypercapnia (HCC)   AF (paroxysmal atrial fibrillation) (HCC)   Obstructive sleep apnea   COPD, severe (HCC)   Chronic heart failure with preserved ejection fraction (HCC)   Acute on chronic respiratory failure hypoxia plan is going to be to continue with the weaning on T-piece as tolerated continue secretion management and pulmonary toilet. Paroxysmal atrial fibrillation right now rate is controlled Severe COPD medical management nebulizers as needed Obstructive sleep apnea nonissue at this time Chronic heart failure preserved ejection fraction   I have personally seen and evaluated the patient, evaluated laboratory and imaging results, formulated the assessment and plan and placed orders. The Patient requires high complexity decision making with multiple systems involvement.  Rounds were done with the Respiratory Therapy Director and Staff therapists and discussed with nursing staff also.  Allyne Gee, MD [facility] Pulmonary Critical Care Medicine Sleep Medicine

## 2020-11-04 LAB — CBC
HCT: 30.1 % — ABNORMAL LOW (ref 39.0–52.0)
Hemoglobin: 9.7 g/dL — ABNORMAL LOW (ref 13.0–17.0)
MCH: 33.3 pg (ref 26.0–34.0)
MCHC: 32.2 g/dL (ref 30.0–36.0)
MCV: 103.4 fL — ABNORMAL HIGH (ref 80.0–100.0)
Platelets: 264 10*3/uL (ref 150–400)
RBC: 2.91 MIL/uL — ABNORMAL LOW (ref 4.22–5.81)
RDW: 18.2 % — ABNORMAL HIGH (ref 11.5–15.5)
WBC: 8.4 10*3/uL (ref 4.0–10.5)
nRBC: 0.2 % (ref 0.0–0.2)

## 2020-11-05 LAB — CBC
HCT: 31.7 % — ABNORMAL LOW (ref 39.0–52.0)
Hemoglobin: 10.1 g/dL — ABNORMAL LOW (ref 13.0–17.0)
MCH: 33 pg (ref 26.0–34.0)
MCHC: 31.9 g/dL (ref 30.0–36.0)
MCV: 103.6 fL — ABNORMAL HIGH (ref 80.0–100.0)
Platelets: 266 10*3/uL (ref 150–400)
RBC: 3.06 MIL/uL — ABNORMAL LOW (ref 4.22–5.81)
RDW: 18.1 % — ABNORMAL HIGH (ref 11.5–15.5)
WBC: 7.9 10*3/uL (ref 4.0–10.5)
nRBC: 0 % (ref 0.0–0.2)

## 2020-11-05 LAB — BASIC METABOLIC PANEL
Anion gap: 7 (ref 5–15)
BUN: 25 mg/dL — ABNORMAL HIGH (ref 8–23)
CO2: 36 mmol/L — ABNORMAL HIGH (ref 22–32)
Calcium: 8.9 mg/dL (ref 8.9–10.3)
Chloride: 88 mmol/L — ABNORMAL LOW (ref 98–111)
Creatinine, Ser: 0.49 mg/dL — ABNORMAL LOW (ref 0.61–1.24)
GFR, Estimated: >60 mL/min (ref >60)
Glucose, Bld: 118 mg/dL — ABNORMAL HIGH (ref 70–99)
Potassium: 3.9 mmol/L (ref 3.5–5.1)
Sodium: 131 mmol/L — ABNORMAL LOW (ref 135–145)

## 2020-11-05 LAB — MAGNESIUM: Magnesium: 2.4 mg/dL (ref 1.7–2.4)

## 2020-11-05 LAB — PHOSPHORUS: Phosphorus: 3.3 mg/dL (ref 2.5–4.6)

## 2020-11-05 NOTE — Progress Notes (Signed)
Pulmonary Critical Care Medicine [place]  PROGRESS NOTE     Austin Davis  MGN:[id number]  DOB: 1940/03/12   DOA: 09/29/2020  Referring Physician: Satira Sark, MD  HPI: Austin Davis is a 80 y.o. male being followed for ventilator/airway/oxygen weaning Acute on Chronic Respiratory Failure.  Afebrile resting comfortably has been on a pressure support wean going to do with T collar goal today of 8 hours  Medications: Reviewed on Rounds  Physical Exam:  Vitals: Temperature is 97.1 pulse 71 respiratory 17 blood pressure is 100/68 saturations 99%  Ventilator Settings on pressure support 12/5 tidal volume 488  General: Comfortable at this time Neck: supple Cardiovascular: no malignant arrhythmias Respiratory: No rhonchi no rales are noted at this time Skin: no rash seen on limited exam Musculoskeletal: No gross abnormality Psychiatric:unable to assess Neurologic:no involuntary movements         Lab Data:   Basic Metabolic Panel: Recent Labs  Lab 10/30/20 0543 10/31/20 0334 11/01/20 0059 11/02/20 0440 11/05/20 0442  NA 134* 133* 135 134* 131*  K 3.8 3.5 3.5 3.9 3.9  CL 91* 89* 89* 92* 88*  CO2 38* 40* 38* 36* 36*  GLUCOSE 117* 112* 135* 122* 118*  BUN 20 20 22 21  25*  CREATININE 0.42* 0.40* 0.39* 0.37* 0.49*  CALCIUM 8.2* 8.2* 8.1* 8.5* 8.9  MG 2.1 2.2 2.2 2.3 2.4  PHOS 2.3* 2.3* 3.3 3.1 3.3    ABG: No results for input(s): PHART, PCO2ART, PO2ART, HCO3, O2SAT in the last 168 hours.  Liver Function Tests: No results for input(s): AST, ALT, ALKPHOS, BILITOT, PROT, ALBUMIN in the last 168 hours. No results for input(s): LIPASE, AMYLASE in the last 168 hours. No results for input(s): AMMONIA in the last 168 hours.  CBC: Recent Labs  Lab 10/31/20 0334 11/01/20 0059 11/02/20 0440 11/04/20 0437 11/05/20 0442  WBC 4.8 6.3 6.8 8.4 7.9  HGB 7.6* 8.1* 8.8* 9.7* 10.1*  HCT 24.0* 25.0* 26.8*  30.1* 31.7*  MCV 104.3* 102.9* 101.1* 103.4* 103.6*  PLT 249 262 284 264 266    Cardiac Enzymes: No results for input(s): CKTOTAL, CKMB, CKMBINDEX, TROPONINI in the last 168 hours.  BNP (last 3 results) Recent Labs    09/19/20 1606  BNP 876.9*    ProBNP (last 3 results) No results for input(s): PROBNP in the last 8760 hours.  Radiological Exams: No results found.  Assessment/Plan Active Problems:   Acute on chronic respiratory failure with hypoxia and hypercapnia (HCC)   AF (paroxysmal atrial fibrillation) (HCC)   Obstructive sleep apnea   COPD, severe (HCC)   Chronic heart failure with preserved ejection fraction (HCC)   Acute on chronic respiratory failure with hypoxia plan is going to be to continue to wean on pressure support as tolerated.  Continue pulmonary toilet and secretion management. Atrial fibrillation right now rate is controlled at this time. Severe COPD medical management Chronic heart failure preserved ejection fraction continue with supportive care Obstructive sleep apnea patient is actually done fine with a tracheostomy in place   I have personally seen and evaluated the patient, evaluated laboratory and imaging results, formulated the assessment and plan and placed orders. The Patient requires high complexity decision making with multiple systems involvement.  Rounds were done with the Respiratory Therapy Director and Staff therapists and discussed with nursing staff also.  Allyne Gee, MD [facility] Pulmonary Critical Care Medicine Sleep Medicine

## 2020-11-06 NOTE — Progress Notes (Signed)
Pulmonary Critical Care Medicine [place]  PROGRESS NOTE     Austin Davis  XNT:[id number]  DOB: [birth year]   DOA: 09/29/2020  Referring Physician: Satira Sark, MD  HPI: Austin Davis is a 80 y.o. male being followed for ventilator/airway/oxygen weaning Acute on Chronic Respiratory Failure.  Patient is on 28% FiO2 supposed to wean on T collar today for 8 hours  Medications: Reviewed on Rounds  Physical Exam:  Vitals: Temperature was 98.2 pulse 83 respiratory 22 blood pressure is 120/60 saturations 96%  Ventilator Settings on assist control right now  General: Comfortable at this time Neck: supple Cardiovascular: no malignant arrhythmias Respiratory: Coarse breath sounds with few scattered rhonchi Skin: no rash seen on limited exam Musculoskeletal: No gross abnormality Psychiatric:unable to assess Neurologic:no involuntary movements         Lab Data:   Basic Metabolic Panel: Recent Labs  Lab 10/31/20 0334 11/01/20 0059 11/02/20 0440 11/05/20 0442  NA 133* 135 134* 131*  K 3.5 3.5 3.9 3.9  CL 89* 89* 92* 88*  CO2 40* 38* 36* 36*  GLUCOSE 112* 135* 122* 118*  BUN 20 22 21  25*  CREATININE 0.40* 0.39* 0.37* 0.49*  CALCIUM 8.2* 8.1* 8.5* 8.9  MG 2.2 2.2 2.3 2.4  PHOS 2.3* 3.3 3.1 3.3    ABG: No results for input(s): PHART, PCO2ART, PO2ART, HCO3, O2SAT in the last 168 hours.  Liver Function Tests: No results for input(s): AST, ALT, ALKPHOS, BILITOT, PROT, ALBUMIN in the last 168 hours. No results for input(s): LIPASE, AMYLASE in the last 168 hours. No results for input(s): AMMONIA in the last 168 hours.  CBC: Recent Labs  Lab 10/31/20 0334 11/01/20 0059 11/02/20 0440 11/04/20 0437 11/05/20 0442  WBC 4.8 6.3 6.8 8.4 7.9  HGB 7.6* 8.1* 8.8* 9.7* 10.1*  HCT 24.0* 25.0* 26.8* 30.1* 31.7*  MCV 104.3* 102.9* 101.1* 103.4* 103.6*  PLT 249 262 284 264 266    Cardiac Enzymes: No results  for input(s): CKTOTAL, CKMB, CKMBINDEX, TROPONINI in the last 168 hours.  BNP (last 3 results) Recent Labs    09/19/20 1606  BNP 876.9*    ProBNP (last 3 results) No results for input(s): PROBNP in the last 8760 hours.  Radiological Exams: No results found.  Assessment/Plan Active Problems:   Acute on chronic respiratory failure with hypoxia and hypercapnia (HCC)   AF (paroxysmal atrial fibrillation) (HCC)   Obstructive sleep apnea   COPD, severe (HCC)   Chronic heart failure with preserved ejection fraction (HCC)   Acute on chronic respiratory failure with hypoxia we will continue with the T collar wean titrate oxygen as tolerated continue pulmonary toilet. Paroxysmal atrial fibrillation right now rate is controlled we will continue to monitor Obstructive sleep apnea patient is at baseline Severe COPD medical management Chronic heart failure preserved ejection fraction no change we will continue to follow along   I have personally seen and evaluated the patient, evaluated laboratory and imaging results, formulated the assessment and plan and placed orders. The Patient requires high complexity decision making with multiple systems involvement.  Rounds were done with the Respiratory Therapy Director and Staff therapists and discussed with nursing staff also.  Allyne Gee, MD [facility] Pulmonary Critical Care Medicine Sleep Medicine

## 2020-11-07 LAB — BASIC METABOLIC PANEL
Anion gap: 8 (ref 5–15)
BUN: 28 mg/dL — ABNORMAL HIGH (ref 8–23)
CO2: 33 mmol/L — ABNORMAL HIGH (ref 22–32)
Calcium: 8.7 mg/dL — ABNORMAL LOW (ref 8.9–10.3)
Chloride: 90 mmol/L — ABNORMAL LOW (ref 98–111)
Creatinine, Ser: 0.41 mg/dL — ABNORMAL LOW (ref 0.61–1.24)
GFR, Estimated: >60 mL/min (ref >60)
Glucose, Bld: 121 mg/dL — ABNORMAL HIGH (ref 70–99)
Potassium: 3.6 mmol/L (ref 3.5–5.1)
Sodium: 131 mmol/L — ABNORMAL LOW (ref 135–145)

## 2020-11-07 NOTE — Progress Notes (Signed)
Pulmonary Critical Care Medicine [place]  PROGRESS NOTE     Austin Davis  YIR:[id number]  DOB: Oct 11, 1940   DOA: 09/29/2020  Referring Physician: Satira Sark, MD  HPI: Austin Davis is a 80 y.o. male being followed for ventilator/airway/oxygen weaning Acute on Chronic Respiratory Failure.  Patient is afebrile right now comfortable without distress has been on pressure support was attempted on T collar earlier which the patient did not tolerate  Medications: Reviewed on Rounds  Physical Exam:  Vitals: Temperature 98.0 pulse 80 respiratory 26 blood pressure 103/62 saturations 100%  Ventilator Settings on pressure support FiO2 28% tidal volume 323 pressure 12/5  General: Comfortable at this time Neck: supple Cardiovascular: no malignant arrhythmias Respiratory: Scattered rhonchi are noted bilaterally Skin: no rash seen on limited exam Musculoskeletal: No gross abnormality Psychiatric:unable to assess Neurologic:no involuntary movements         Lab Data:   Basic Metabolic Panel: Recent Labs  Lab 11/01/20 0059 11/02/20 0440 11/05/20 0442 11/07/20 0418  NA 135 134* 131* 131*  K 3.5 3.9 3.9 3.6  CL 89* 92* 88* 90*  CO2 38* 36* 36* 33*  GLUCOSE 135* 122* 118* 121*  BUN 22 21 25* 28*  CREATININE 0.39* 0.37* 0.49* 0.41*  CALCIUM 8.1* 8.5* 8.9 8.7*  MG 2.2 2.3 2.4  --   PHOS 3.3 3.1 3.3  --     ABG: No results for input(s): PHART, PCO2ART, PO2ART, HCO3, O2SAT in the last 168 hours.  Liver Function Tests: No results for input(s): AST, ALT, ALKPHOS, BILITOT, PROT, ALBUMIN in the last 168 hours. No results for input(s): LIPASE, AMYLASE in the last 168 hours. No results for input(s): AMMONIA in the last 168 hours.  CBC: Recent Labs  Lab 11/01/20 0059 11/02/20 0440 11/04/20 0437 11/05/20 0442  WBC 6.3 6.8 8.4 7.9  HGB 8.1* 8.8* 9.7* 10.1*  HCT 25.0* 26.8* 30.1* 31.7*  MCV 102.9* 101.1*  103.4* 103.6*  PLT 262 284 264 266    Cardiac Enzymes: No results for input(s): CKTOTAL, CKMB, CKMBINDEX, TROPONINI in the last 168 hours.  BNP (last 3 results) Recent Labs    09/19/20 1606  BNP 876.9*    ProBNP (last 3 results) No results for input(s): PROBNP in the last 8760 hours.  Radiological Exams: No results found.  Assessment/Plan Active Problems:   Acute on chronic respiratory failure with hypoxia and hypercapnia (HCC)   AF (paroxysmal atrial fibrillation) (HCC)   Obstructive sleep apnea   COPD, severe (HCC)   Chronic heart failure with preserved ejection fraction (HCC)   Acute on chronic respiratory failure hypoxia we will continue with the pressure support for now.  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 The Patient requires high complexity decision making with multiple systems involvement.  Rounds were done with the Respiratory Therapy Director and Staff therapists and discussed with nursing staff also.  Allyne Gee, MD [facility] Pulmonary Critical Care Medicine Sleep Medicine

## 2020-11-08 NOTE — Progress Notes (Signed)
Pulmonary Critical Care Medicine [place]  PROGRESS NOTE     Austin Davis  [id number]:[id number]  DOB: Jul 30, 1940   DOA: 09/29/2020  Referring Physician: Satira Sark, MD  HPI: Austin Davis is a 80 y.o. male being followed for ventilator/airway/oxygen weaning Acute on Chronic Respiratory Failure.  A lot of anxiety issues going on patient remains on T collar today today's goal is for 8 hours  Medications: Reviewed on Rounds  Physical Exam:  Vitals: Temperature is 98.6 pulse 97 respiratory 27 blood pressure is 107/56 saturations 98%  Ventilator Settings on T collar  General: Comfortable at this time Neck: supple Cardiovascular: no malignant arrhythmias Respiratory: No rhonchi no rales are noted at this time Skin: no rash seen on limited exam Musculoskeletal: No gross abnormality Psychiatric:unable to assess Neurologic:no involuntary movements         Lab Data:   Basic Metabolic Panel: Recent Labs  Lab 11/02/20 0440 11/05/20 0442 11/07/20 0418  NA 134* 131* 131*  K 3.9 3.9 3.6  CL 92* 88* 90*  CO2 36* 36* 33*  GLUCOSE 122* 118* 121*  BUN 21 25* 28*  CREATININE 0.37* 0.49* 0.41*  CALCIUM 8.5* 8.9 8.7*  MG 2.3 2.4  --   PHOS 3.1 3.3  --     ABG: No results for input(s): PHART, PCO2ART, PO2ART, HCO3, O2SAT in the last 168 hours.  Liver Function Tests: No results for input(s): AST, ALT, ALKPHOS, BILITOT, PROT, ALBUMIN in the last 168 hours. No results for input(s): LIPASE, AMYLASE in the last 168 hours. No results for input(s): AMMONIA in the last 168 hours.  CBC: Recent Labs  Lab 11/02/20 0440 11/04/20 0437 11/05/20 0442  WBC 6.8 8.4 7.9  HGB 8.8* 9.7* 10.1*  HCT 26.8* 30.1* 31.7*  MCV 101.1* 103.4* 103.6*  PLT 284 264 266    Cardiac Enzymes: No results for input(s): CKTOTAL, CKMB, CKMBINDEX, TROPONINI in the last 168 hours.  BNP (last 3 results) Recent Labs    09/19/20 1606  BNP  876.9*    ProBNP (last 3 results) No results for input(s): PROBNP in the last 8760 hours.  Radiological Exams: No results found.  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 The Patient requires high complexity decision making with multiple systems involvement.  Rounds were done with the Respiratory Therapy Director and Staff therapists and discussed with nursing staff also.  Allyne Gee, MD [facility] Pulmonary Critical Care Medicine Sleep Medicine

## 2020-11-09 LAB — CBC
HCT: 31.2 % — ABNORMAL LOW (ref 39.0–52.0)
Hemoglobin: 10.2 g/dL — ABNORMAL LOW (ref 13.0–17.0)
MCH: 34 pg (ref 26.0–34.0)
MCHC: 32.7 g/dL (ref 30.0–36.0)
MCV: 104 fL — ABNORMAL HIGH (ref 80.0–100.0)
Platelets: 278 10*3/uL (ref 150–400)
RBC: 3 MIL/uL — ABNORMAL LOW (ref 4.22–5.81)
RDW: 17.3 % — ABNORMAL HIGH (ref 11.5–15.5)
WBC: 9.3 10*3/uL (ref 4.0–10.5)
nRBC: 0 % (ref 0.0–0.2)

## 2020-11-09 LAB — MAGNESIUM: Magnesium: 2.1 mg/dL (ref 1.7–2.4)

## 2020-11-09 LAB — RENAL FUNCTION PANEL
Albumin: 2.4 g/dL — ABNORMAL LOW (ref 3.5–5.0)
Anion gap: 5 (ref 5–15)
BUN: 25 mg/dL — ABNORMAL HIGH (ref 8–23)
CO2: 34 mmol/L — ABNORMAL HIGH (ref 22–32)
Calcium: 8.6 mg/dL — ABNORMAL LOW (ref 8.9–10.3)
Chloride: 95 mmol/L — ABNORMAL LOW (ref 98–111)
Creatinine, Ser: 0.35 mg/dL — ABNORMAL LOW (ref 0.61–1.24)
GFR, Estimated: >60 mL/min (ref >60)
Glucose, Bld: 119 mg/dL — ABNORMAL HIGH (ref 70–99)
Phosphorus: 2.9 mg/dL (ref 2.5–4.6)
Potassium: 4 mmol/L (ref 3.5–5.1)
Sodium: 134 mmol/L — ABNORMAL LOW (ref 135–145)

## 2020-11-09 NOTE — Progress Notes (Signed)
Pulmonary Critical Care Medicine [place]  PROGRESS NOTE     Austin Davis  VCB:[id number]  DOB: [birth year]   DOA: 09/29/2020  Referring Physician: Satira Sark, MD  HPI: Austin Davis is a 80 y.o. male being followed for ventilator/airway/oxygen weaning Acute on Chronic Respiratory Failure.  Patient is on assist control mode has been on 28% FiO2 with PEEP 5  Medications: Reviewed on Rounds  Physical Exam:  Vitals: Temperature is 98.0 pulse 97 respiratory is 26 blood pressure 126/60 saturations 100%  Ventilator Settings on assist control FiO2 is 28% tidal volume 420 PEEP 5  General: Comfortable at this time Neck: supple Cardiovascular: no malignant arrhythmias Respiratory: Scattered rhonchi expansion equal Skin: no rash seen on limited exam Musculoskeletal: No gross abnormality Psychiatric:unable to assess Neurologic:no involuntary movements         Lab Data:   Basic Metabolic Panel: Recent Labs  Lab 11/05/20 0442 11/07/20 0418 11/09/20 0448  NA 131* 131* 134*  K 3.9 3.6 4.0  CL 88* 90* 95*  CO2 36* 33* 34*  GLUCOSE 118* 121* 119*  BUN 25* 28* 25*  CREATININE 0.49* 0.41* 0.35*  CALCIUM 8.9 8.7* 8.6*  MG 2.4  --  2.1  PHOS 3.3  --  2.9    ABG: No results for input(s): PHART, PCO2ART, PO2ART, HCO3, O2SAT in the last 168 hours.  Liver Function Tests: Recent Labs  Lab 11/09/20 0448  ALBUMIN 2.4*   No results for input(s): LIPASE, AMYLASE in the last 168 hours. No results for input(s): AMMONIA in the last 168 hours.  CBC: Recent Labs  Lab 11/04/20 0437 11/05/20 0442 11/09/20 0448  WBC 8.4 7.9 9.3  HGB 9.7* 10.1* 10.2*  HCT 30.1* 31.7* 31.2*  MCV 103.4* 103.6* 104.0*  PLT 264 266 278    Cardiac Enzymes: No results for input(s): CKTOTAL, CKMB, CKMBINDEX, TROPONINI in the last 168 hours.  BNP (last 3 results) Recent Labs    09/19/20 1606  BNP 876.9*    ProBNP (last 3  results) No results for input(s): PROBNP in the last 8760 hours.  Radiological Exams: No results found.  Assessment/Plan Active Problems:   Acute on chronic respiratory failure with hypoxia and hypercapnia (HCC)   AF (paroxysmal atrial fibrillation) (HCC)   Obstructive sleep apnea   COPD, severe (HCC)   Chronic heart failure with preserved ejection fraction (HCC)   Acute on chronic respiratory failure with hypoxia patient was attempted on T collar failed has a lot of issues with anxiety respiratory therapy will reassess continue secretion management pulmonary toilet Atrial fibrillation right now rate is controlled Obstructive sleep apnea nonissue Severe COPD medical management Chronic heart failure preserved ejection fraction no change we will continue to follow along closely   I have personally seen and evaluated the patient, evaluated laboratory and imaging results, formulated the assessment and plan and placed orders. The Patient requires high complexity decision making with multiple systems involvement.  Rounds were done with the Respiratory Therapy Director and Staff therapists and discussed with nursing staff also.  Allyne Gee, MD [facility] Pulmonary Critical Care Medicine Sleep Medicine

## 2020-11-10 NOTE — Progress Notes (Signed)
Pulmonary Critical Care Medicine [place]  PROGRESS NOTE     Austin Davis  [id number]:[id number]  DOB: Feb 13, 1940   DOA: 09/29/2020  Referring Physician: Satira Sark, MD  HPI: Austin Davis is a 80 y.o. male being followed for ventilator/airway/oxygen weaning Acute on Chronic Respiratory Failure.  Patient is afebrile right now comfortable has been on T collar  Medications: Reviewed on Rounds  Physical Exam:  Vitals: Temperature is 97.7 pulse 76 respiratory rate is 27 blood pressure is 108/50 saturations 100%  Ventilator Settings on T collar  General: Comfortable at this time Neck: supple Cardiovascular: no malignant arrhythmias Respiratory: No rhonchi very coarse breath sounds Skin: no rash seen on limited exam Musculoskeletal: No gross abnormality Psychiatric:unable to assess Neurologic:no involuntary movements         Lab Data:   Basic Metabolic Panel: Recent Labs  Lab 11/05/20 0442 11/07/20 0418 11/09/20 0448  NA 131* 131* 134*  K 3.9 3.6 4.0  CL 88* 90* 95*  CO2 36* 33* 34*  GLUCOSE 118* 121* 119*  BUN 25* 28* 25*  CREATININE 0.49* 0.41* 0.35*  CALCIUM 8.9 8.7* 8.6*  MG 2.4  --  2.1  PHOS 3.3  --  2.9    ABG: No results for input(s): PHART, PCO2ART, PO2ART, HCO3, O2SAT in the last 168 hours.  Liver Function Tests: Recent Labs  Lab 11/09/20 0448  ALBUMIN 2.4*   No results for input(s): LIPASE, AMYLASE in the last 168 hours. No results for input(s): AMMONIA in the last 168 hours.  CBC: Recent Labs  Lab 11/04/20 0437 11/05/20 0442 11/09/20 0448  WBC 8.4 7.9 9.3  HGB 9.7* 10.1* 10.2*  HCT 30.1* 31.7* 31.2*  MCV 103.4* 103.6* 104.0*  PLT 264 266 278    Cardiac Enzymes: No results for input(s): CKTOTAL, CKMB, CKMBINDEX, TROPONINI in the last 168 hours.  BNP (last 3 results) Recent Labs    09/19/20 1606  BNP 876.9*    ProBNP (last 3 results) No results for input(s):  PROBNP in the last 8760 hours.  Radiological Exams: No results found.  Assessment/Plan Active Problems:   Acute on chronic respiratory failure with hypoxia and hypercapnia (HCC)   AF (paroxysmal atrial fibrillation) (HCC)   Obstructive sleep apnea   COPD, severe (HCC)   Chronic heart failure with preserved ejection fraction (HCC)   Acute on chronic respiratory failure hypoxia plan is to continue with the T collar weaning as tolerated continue pulmonary toilet supportive care Atrial fibrillation rate now rate is controlled we will continue to follow along Obstructive sleep apnea no change Severe COPD medical management Tracheostomy remains in place   I have personally seen and evaluated the patient, evaluated laboratory and imaging results, formulated the assessment and plan and placed orders. The Patient requires high complexity decision making with multiple systems involvement.  Rounds were done with the Respiratory Therapy Director and Staff therapists and discussed with nursing staff also.  Allyne Gee, MD [facility] Pulmonary Critical Care Medicine Sleep Medicine

## 2020-11-11 ENCOUNTER — Other Ambulatory Visit (HOSPITAL_COMMUNITY): Payer: Medicare Other

## 2020-11-11 IMAGING — DX DG CHEST 1V PORT
1 series · 1 of 1 positions shown · non-contrast
Comparison: [DATE]

CLINICAL DATA: Oxygen desaturation.

EXAM:
PORTABLE CHEST 1 VIEW

[chest]
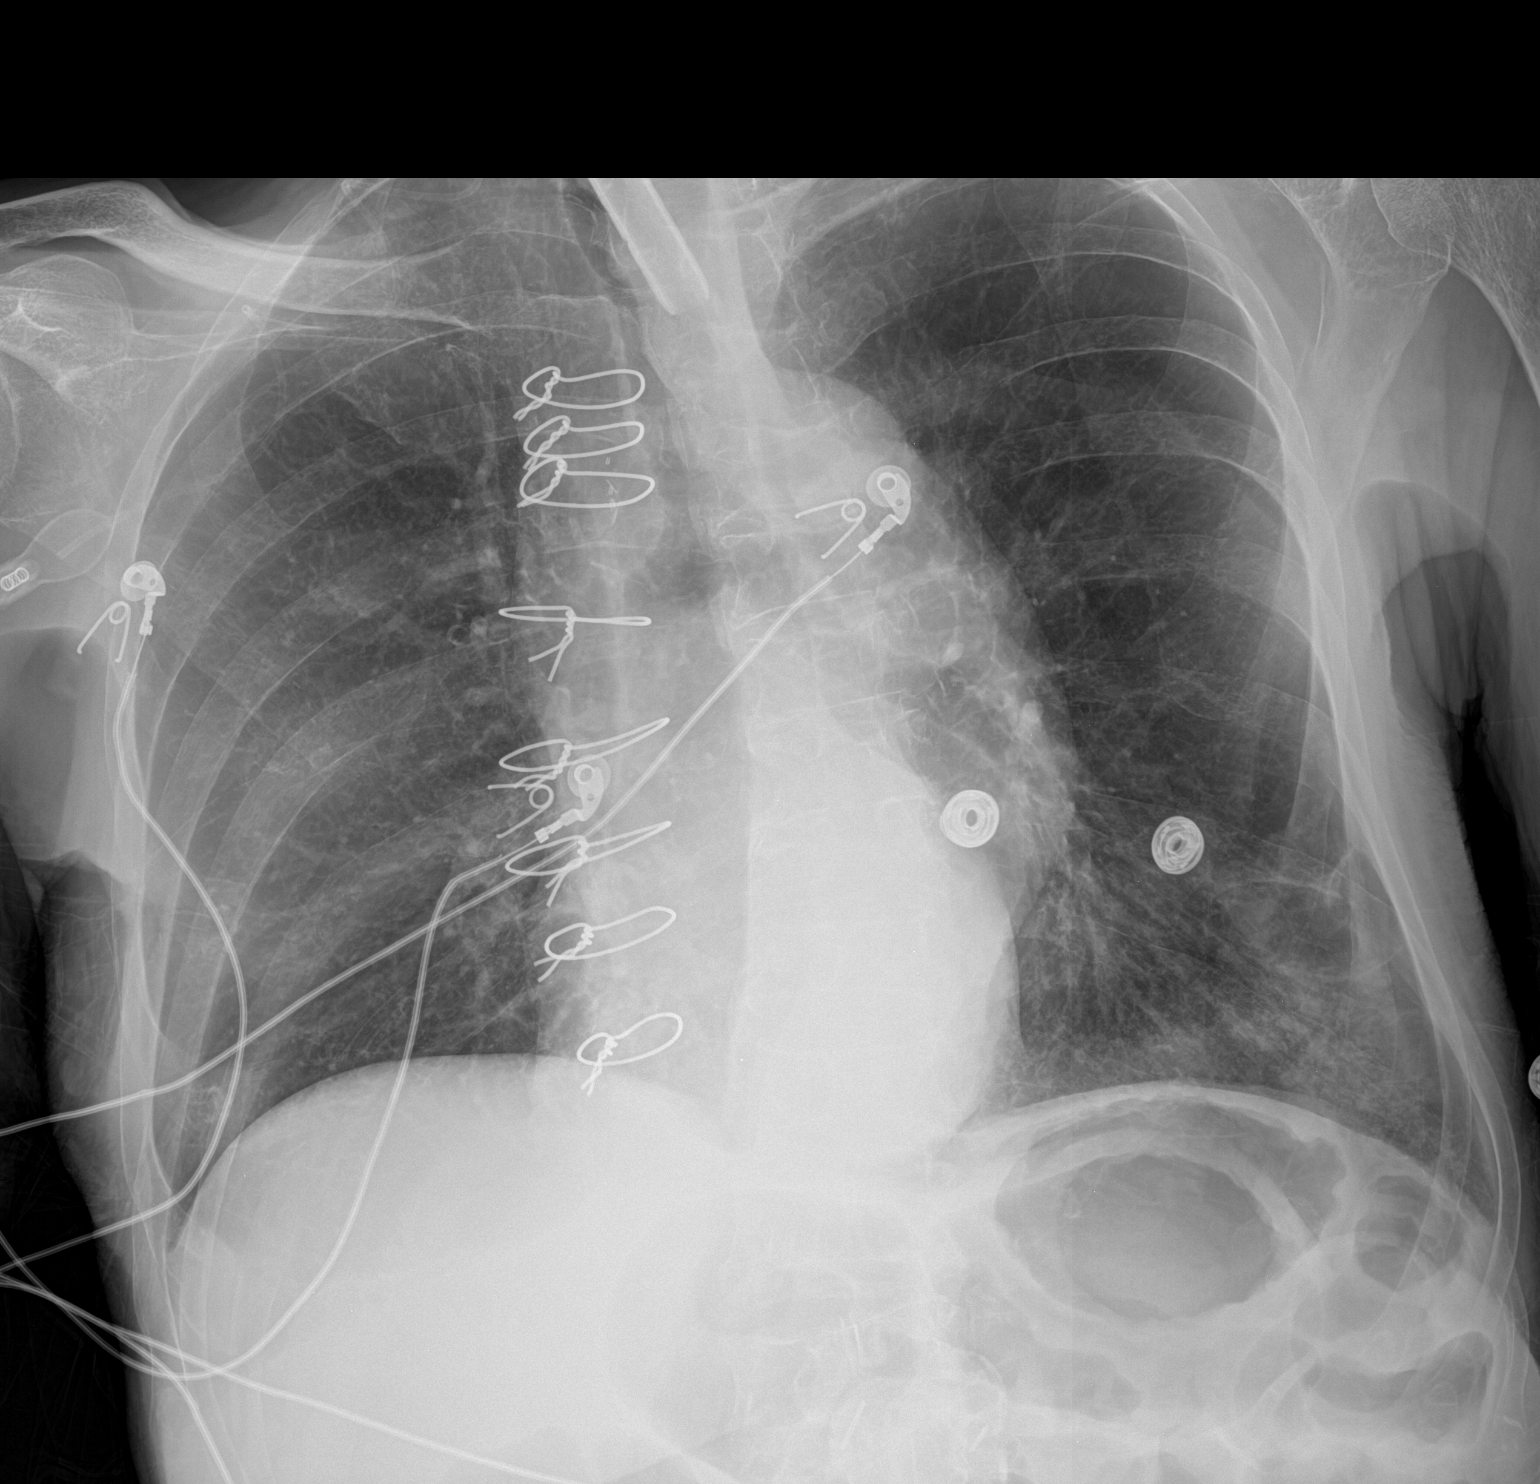

[1 of 1 positions shown; findings below may reference images not displayed]

FINDINGS: Tracheostomy tube tip is above the carina. Signs of previous median
sternotomy and CABG procedure. Normal heart size. Airspace disease
is identified within the left lung base. Right lung appears clear.
IMPRESSION: Left base airspace disease compatible with pneumonia.

## 2020-11-12 LAB — BASIC METABOLIC PANEL
Anion gap: 8 (ref 5–15)
BUN: 24 mg/dL — ABNORMAL HIGH (ref 8–23)
CO2: 37 mmol/L — ABNORMAL HIGH (ref 22–32)
Calcium: 8.7 mg/dL — ABNORMAL LOW (ref 8.9–10.3)
Chloride: 92 mmol/L — ABNORMAL LOW (ref 98–111)
Creatinine, Ser: 0.39 mg/dL — ABNORMAL LOW (ref 0.61–1.24)
GFR, Estimated: >60 mL/min (ref >60)
Glucose, Bld: 63 mg/dL — ABNORMAL LOW (ref 70–99)
Potassium: 3.9 mmol/L (ref 3.5–5.1)
Sodium: 137 mmol/L (ref 135–145)

## 2020-11-12 LAB — CBC
HCT: 30.6 % — ABNORMAL LOW (ref 39.0–52.0)
Hemoglobin: 9.7 g/dL — ABNORMAL LOW (ref 13.0–17.0)
MCH: 33.4 pg (ref 26.0–34.0)
MCHC: 31.7 g/dL (ref 30.0–36.0)
MCV: 105.5 fL — ABNORMAL HIGH (ref 80.0–100.0)
Platelets: 303 10*3/uL (ref 150–400)
RBC: 2.9 MIL/uL — ABNORMAL LOW (ref 4.22–5.81)
RDW: 17.2 % — ABNORMAL HIGH (ref 11.5–15.5)
WBC: 7.2 10*3/uL (ref 4.0–10.5)
nRBC: 0 % (ref 0.0–0.2)

## 2020-11-12 LAB — PHOSPHORUS: Phosphorus: 3.4 mg/dL (ref 2.5–4.6)

## 2020-11-12 LAB — MAGNESIUM: Magnesium: 2.1 mg/dL (ref 1.7–2.4)

## 2020-11-12 NOTE — Progress Notes (Signed)
Pulmonary Critical Care Medicine [place]  PROGRESS NOTE     Austin Davis  SJG:[id number]  DOB: [birth year]   DOA: 09/29/2020  Referring Physician: Satira Sark, MD  HPI: Austin Davis is a 80 y.o. male being followed for ventilator/airway/oxygen weaning Acute on Chronic Respiratory Failure.  Patient has not been consistently tolerating the collar he has not been able to meet the 8-hour goal yet  Medications: Reviewed on Rounds  Physical Exam:  Vitals: Temperature 97.7 pulse 70 respiratory is 19 blood pressure 108/57 saturations 98%  Ventilator Settings on pressure support FiO2 is 40% pressure 12/5  General: Comfortable at this time Neck: supple Cardiovascular: no malignant arrhythmias Respiratory: No rhonchi very coarse breath sounds Skin: no rash seen on limited exam Musculoskeletal: No gross abnormality Psychiatric:unable to assess Neurologic:no involuntary movements         Lab Data:   Basic Metabolic Panel: Recent Labs  Lab 11/07/20 0418 11/09/20 0448 11/12/20 0737  NA 131* 134* 137  K 3.6 4.0 3.9  CL 90* 95* 92*  CO2 33* 34* 37*  GLUCOSE 121* 119* 63*  BUN 28* 25* 24*  CREATININE 0.41* 0.35* 0.39*  CALCIUM 8.7* 8.6* 8.7*  MG  --  2.1 2.1  PHOS  --  2.9 3.4    ABG: No results for input(s): PHART, PCO2ART, PO2ART, HCO3, O2SAT in the last 168 hours.  Liver Function Tests: Recent Labs  Lab 11/09/20 0448  ALBUMIN 2.4*   No results for input(s): LIPASE, AMYLASE in the last 168 hours. No results for input(s): AMMONIA in the last 168 hours.  CBC: Recent Labs  Lab 11/09/20 0448 11/12/20 0737  WBC 9.3 7.2  HGB 10.2* 9.7*  HCT 31.2* 30.6*  MCV 104.0* 105.5*  PLT 278 303    Cardiac Enzymes: No results for input(s): CKTOTAL, CKMB, CKMBINDEX, TROPONINI in the last 168 hours.  BNP (last 3 results) Recent Labs    09/19/20 1606  BNP 876.9*    ProBNP (last 3 results) No  results for input(s): PROBNP in the last 8760 hours.  Radiological Exams: DG Chest Port 1 View  Result Date: 11/11/2020 CLINICAL DATA:  Oxygen desaturation. EXAM: PORTABLE CHEST 1 VIEW COMPARISON:  11/03/2020 FINDINGS: Tracheostomy tube tip is above the carina. Signs of previous median sternotomy and CABG procedure. Normal heart size. Airspace disease is identified within the left lung base. Right lung appears clear. IMPRESSION: Left base airspace disease compatible with pneumonia. Electronically Signed   By: Kerby Moors M.D.   On: 11/11/2020 08:43    Assessment/Plan Active Problems:   Acute on chronic respiratory failure with hypoxia and hypercapnia (HCC)   AF (paroxysmal atrial fibrillation) (HCC)   Obstructive sleep apnea   COPD, severe (HCC)   Chronic heart failure with preserved ejection fraction (HCC)   Acute on chronic respiratory failure with hypoxia we will wean once again on T collar with a goal of 8 hours today hopefully Paroxysmal atrial fibrillation rate now rate is controlled Obstructive sleep apnea no apneas are noted at this time.  Nonissue with patient being on the ventilator Severe COPD medical management right lung is clear left lung may have possible pneumonia noted on yesterday's x-ray we will discuss with primary team Chronic heart failure preserved ejection fraction monitor patient's lab work closely   I have personally seen and evaluated the patient, evaluated laboratory and imaging results, formulated the assessment and plan and placed orders. The Patient requires high complexity  decision making with multiple systems involvement.  Rounds were done with the Respiratory Therapy Director and Staff therapists and discussed with nursing staff also.  Allyne Gee, MD [facility] Pulmonary Critical Care Medicine Sleep Medicine

## 2020-11-13 LAB — CULTURE, RESPIRATORY W GRAM STAIN

## 2020-11-13 LAB — RESP PANEL BY RT-PCR (FLU A&B, COVID) ARPGX2
Influenza A by PCR: NEGATIVE
Influenza B by PCR: NEGATIVE
SARS Coronavirus 2 by RT PCR: NEGATIVE

## 2020-11-13 NOTE — Progress Notes (Signed)
Pulmonary Critical Care Medicine [place]  PROGRESS NOTE     Austin Davis  OZD:[id number]  DOB: 1940/02/21   DOA: 09/29/2020  Referring Physician: Satira Sark, MD  HPI: Austin Davis is a 80 y.o. male being followed for ventilator/airway/oxygen weaning Acute on Chronic Respiratory Failure.  Patient is resting comfortably right now without distress.  He weaning on T collar for goal of 8 hours  Medications: Reviewed on Rounds  Physical Exam:  Vitals: Temperature is 97.6 pulse 61 respiratory 16 blood pressure is 105/49 saturations 100%  Ventilator Settings T collar 40%  General: Comfortable at this time Neck: supple Cardiovascular: no malignant arrhythmias Respiratory: Coarse rhonchi noted bilaterally Skin: no rash seen on limited exam Musculoskeletal: No gross abnormality Psychiatric:unable to assess Neurologic:no involuntary movements         Lab Data:   Basic Metabolic Panel: Recent Labs  Lab 11/07/20 0418 11/09/20 0448 11/12/20 0737  NA 131* 134* 137  K 3.6 4.0 3.9  CL 90* 95* 92*  CO2 33* 34* 37*  GLUCOSE 121* 119* 63*  BUN 28* 25* 24*  CREATININE 0.41* 0.35* 0.39*  CALCIUM 8.7* 8.6* 8.7*  MG  --  2.1 2.1  PHOS  --  2.9 3.4    ABG: No results for input(s): PHART, PCO2ART, PO2ART, HCO3, O2SAT in the last 168 hours.  Liver Function Tests: Recent Labs  Lab 11/09/20 0448  ALBUMIN 2.4*   No results for input(s): LIPASE, AMYLASE in the last 168 hours. No results for input(s): AMMONIA in the last 168 hours.  CBC: Recent Labs  Lab 11/09/20 0448 11/12/20 0737  WBC 9.3 7.2  HGB 10.2* 9.7*  HCT 31.2* 30.6*  MCV 104.0* 105.5*  PLT 278 303    Cardiac Enzymes: No results for input(s): CKTOTAL, CKMB, CKMBINDEX, TROPONINI in the last 168 hours.  BNP (last 3 results) Recent Labs    09/19/20 1606  BNP 876.9*    ProBNP (last 3 results) No results for input(s): PROBNP in the  last 8760 hours.  Radiological Exams: No results found.  Assessment/Plan Active Problems:   Acute on chronic respiratory failure with hypoxia and hypercapnia (HCC)   AF (paroxysmal atrial fibrillation) (HCC)   Obstructive sleep apnea   COPD, severe (HCC)   Chronic heart failure with preserved ejection fraction (HCC)   Acute on chronic respiratory failure with hypoxia patient has not been able to complete the 8 hours of T collar may need ongoing ventilator support and move slowly on the weaning Atrial fibrillation rate now rate controlled Severe COPD medical management Chronic heart failure preserved ejection fraction no changes we will continue to follow along Obstructive sleep apnea patient is at baseline   I have personally seen and evaluated the patient, evaluated laboratory and imaging results, formulated the assessment and plan and placed orders. The Patient requires high complexity decision making with multiple systems involvement.  Rounds were done with the Respiratory Therapy Director and Staff therapists and discussed with nursing staff also.  Allyne Gee, MD [facility] Pulmonary Critical Care Medicine Sleep Medicine

## 2020-11-14 ENCOUNTER — Other Ambulatory Visit (HOSPITAL_COMMUNITY): Payer: Medicare Other

## 2020-11-14 DIAGNOSIS — Z431 Encounter for attention to gastrostomy: Secondary | ICD-10-CM | POA: Diagnosis not present

## 2020-11-14 DIAGNOSIS — J984 Other disorders of lung: Secondary | ICD-10-CM | POA: Diagnosis not present

## 2020-11-14 DIAGNOSIS — Z79890 Hormone replacement therapy (postmenopausal): Secondary | ICD-10-CM | POA: Diagnosis not present

## 2020-11-14 DIAGNOSIS — E039 Hypothyroidism, unspecified: Secondary | ICD-10-CM | POA: Diagnosis present

## 2020-11-14 DIAGNOSIS — R498 Other voice and resonance disorders: Secondary | ICD-10-CM | POA: Diagnosis not present

## 2020-11-14 DIAGNOSIS — I5023 Acute on chronic systolic (congestive) heart failure: Secondary | ICD-10-CM | POA: Diagnosis not present

## 2020-11-14 DIAGNOSIS — R1310 Dysphagia, unspecified: Secondary | ICD-10-CM | POA: Diagnosis not present

## 2020-11-14 DIAGNOSIS — E43 Unspecified severe protein-calorie malnutrition: Secondary | ICD-10-CM | POA: Diagnosis not present

## 2020-11-14 DIAGNOSIS — L84 Corns and callosities: Secondary | ICD-10-CM | POA: Diagnosis not present

## 2020-11-14 DIAGNOSIS — I5082 Biventricular heart failure: Secondary | ICD-10-CM | POA: Diagnosis present

## 2020-11-14 DIAGNOSIS — J90 Pleural effusion, not elsewhere classified: Secondary | ICD-10-CM | POA: Diagnosis not present

## 2020-11-14 DIAGNOSIS — Z23 Encounter for immunization: Secondary | ICD-10-CM | POA: Diagnosis not present

## 2020-11-14 DIAGNOSIS — I959 Hypotension, unspecified: Secondary | ICD-10-CM | POA: Diagnosis not present

## 2020-11-14 DIAGNOSIS — Z931 Gastrostomy status: Secondary | ICD-10-CM | POA: Diagnosis not present

## 2020-11-14 DIAGNOSIS — I82402 Acute embolism and thrombosis of unspecified deep veins of left lower extremity: Secondary | ICD-10-CM | POA: Diagnosis not present

## 2020-11-14 DIAGNOSIS — G4733 Obstructive sleep apnea (adult) (pediatric): Secondary | ICD-10-CM | POA: Diagnosis not present

## 2020-11-14 DIAGNOSIS — K219 Gastro-esophageal reflux disease without esophagitis: Secondary | ICD-10-CM | POA: Diagnosis not present

## 2020-11-14 DIAGNOSIS — I700 Atherosclerosis of aorta: Secondary | ICD-10-CM | POA: Diagnosis not present

## 2020-11-14 DIAGNOSIS — T7840XD Allergy, unspecified, subsequent encounter: Secondary | ICD-10-CM | POA: Diagnosis not present

## 2020-11-14 DIAGNOSIS — J930 Spontaneous tension pneumothorax: Secondary | ICD-10-CM | POA: Diagnosis not present

## 2020-11-14 DIAGNOSIS — Z888 Allergy status to other drugs, medicaments and biological substances status: Secondary | ICD-10-CM | POA: Diagnosis not present

## 2020-11-14 DIAGNOSIS — N400 Enlarged prostate without lower urinary tract symptoms: Secondary | ICD-10-CM | POA: Diagnosis not present

## 2020-11-14 DIAGNOSIS — J9600 Acute respiratory failure, unspecified whether with hypoxia or hypercapnia: Secondary | ICD-10-CM | POA: Diagnosis not present

## 2020-11-14 DIAGNOSIS — Z930 Tracheostomy status: Secondary | ICD-10-CM | POA: Diagnosis not present

## 2020-11-14 DIAGNOSIS — R918 Other nonspecific abnormal finding of lung field: Secondary | ICD-10-CM | POA: Diagnosis not present

## 2020-11-14 DIAGNOSIS — I739 Peripheral vascular disease, unspecified: Secondary | ICD-10-CM | POA: Diagnosis not present

## 2020-11-14 DIAGNOSIS — R0603 Acute respiratory distress: Secondary | ICD-10-CM | POA: Diagnosis present

## 2020-11-14 DIAGNOSIS — E569 Vitamin deficiency, unspecified: Secondary | ICD-10-CM | POA: Diagnosis not present

## 2020-11-14 DIAGNOSIS — Z9911 Dependence on respirator [ventilator] status: Secondary | ICD-10-CM | POA: Diagnosis not present

## 2020-11-14 DIAGNOSIS — Z808 Family history of malignant neoplasm of other organs or systems: Secondary | ICD-10-CM | POA: Diagnosis not present

## 2020-11-14 DIAGNOSIS — J9690 Respiratory failure, unspecified, unspecified whether with hypoxia or hypercapnia: Secondary | ICD-10-CM | POA: Diagnosis not present

## 2020-11-14 DIAGNOSIS — E785 Hyperlipidemia, unspecified: Secondary | ICD-10-CM | POA: Diagnosis not present

## 2020-11-14 DIAGNOSIS — M79675 Pain in left toe(s): Secondary | ICD-10-CM | POA: Diagnosis not present

## 2020-11-14 DIAGNOSIS — J9622 Acute and chronic respiratory failure with hypercapnia: Secondary | ICD-10-CM | POA: Diagnosis not present

## 2020-11-14 DIAGNOSIS — I2510 Atherosclerotic heart disease of native coronary artery without angina pectoris: Secondary | ICD-10-CM | POA: Diagnosis not present

## 2020-11-14 DIAGNOSIS — T83511A Infection and inflammatory reaction due to indwelling urethral catheter, initial encounter: Secondary | ICD-10-CM | POA: Diagnosis present

## 2020-11-14 DIAGNOSIS — R627 Adult failure to thrive: Secondary | ICD-10-CM | POA: Diagnosis present

## 2020-11-14 DIAGNOSIS — Z823 Family history of stroke: Secondary | ICD-10-CM | POA: Diagnosis not present

## 2020-11-14 DIAGNOSIS — J189 Pneumonia, unspecified organism: Secondary | ICD-10-CM | POA: Diagnosis not present

## 2020-11-14 DIAGNOSIS — I4891 Unspecified atrial fibrillation: Secondary | ICD-10-CM | POA: Diagnosis not present

## 2020-11-14 DIAGNOSIS — J188 Other pneumonia, unspecified organism: Secondary | ICD-10-CM | POA: Diagnosis not present

## 2020-11-14 DIAGNOSIS — Z7901 Long term (current) use of anticoagulants: Secondary | ICD-10-CM | POA: Diagnosis not present

## 2020-11-14 DIAGNOSIS — F32A Depression, unspecified: Secondary | ICD-10-CM | POA: Diagnosis present

## 2020-11-14 DIAGNOSIS — Y92129 Unspecified place in nursing home as the place of occurrence of the external cause: Secondary | ICD-10-CM | POA: Diagnosis not present

## 2020-11-14 DIAGNOSIS — G319 Degenerative disease of nervous system, unspecified: Secondary | ICD-10-CM | POA: Diagnosis not present

## 2020-11-14 DIAGNOSIS — G250 Essential tremor: Secondary | ICD-10-CM | POA: Diagnosis not present

## 2020-11-14 DIAGNOSIS — I341 Nonrheumatic mitral (valve) prolapse: Secondary | ICD-10-CM | POA: Diagnosis present

## 2020-11-14 DIAGNOSIS — Z825 Family history of asthma and other chronic lower respiratory diseases: Secondary | ICD-10-CM | POA: Diagnosis not present

## 2020-11-14 DIAGNOSIS — F0150 Vascular dementia without behavioral disturbance: Secondary | ICD-10-CM | POA: Diagnosis not present

## 2020-11-14 DIAGNOSIS — Z8249 Family history of ischemic heart disease and other diseases of the circulatory system: Secondary | ICD-10-CM | POA: Diagnosis not present

## 2020-11-14 DIAGNOSIS — Z79899 Other long term (current) drug therapy: Secondary | ICD-10-CM | POA: Diagnosis not present

## 2020-11-14 DIAGNOSIS — J9620 Acute and chronic respiratory failure, unspecified whether with hypoxia or hypercapnia: Secondary | ICD-10-CM | POA: Diagnosis not present

## 2020-11-14 DIAGNOSIS — J690 Pneumonitis due to inhalation of food and vomit: Secondary | ICD-10-CM | POA: Diagnosis not present

## 2020-11-14 DIAGNOSIS — B351 Tinea unguium: Secondary | ICD-10-CM | POA: Diagnosis not present

## 2020-11-14 DIAGNOSIS — F419 Anxiety disorder, unspecified: Secondary | ICD-10-CM | POA: Diagnosis present

## 2020-11-14 DIAGNOSIS — M6281 Muscle weakness (generalized): Secondary | ICD-10-CM | POA: Diagnosis not present

## 2020-11-14 DIAGNOSIS — I480 Paroxysmal atrial fibrillation: Secondary | ICD-10-CM | POA: Diagnosis present

## 2020-11-14 DIAGNOSIS — I340 Nonrheumatic mitral (valve) insufficiency: Secondary | ICD-10-CM | POA: Diagnosis not present

## 2020-11-14 DIAGNOSIS — J9621 Acute and chronic respiratory failure with hypoxia: Secondary | ICD-10-CM | POA: Diagnosis present

## 2020-11-14 DIAGNOSIS — G20 Parkinson's disease: Secondary | ICD-10-CM | POA: Diagnosis not present

## 2020-11-14 DIAGNOSIS — J939 Pneumothorax, unspecified: Secondary | ICD-10-CM | POA: Diagnosis not present

## 2020-11-14 DIAGNOSIS — I5022 Chronic systolic (congestive) heart failure: Secondary | ICD-10-CM | POA: Diagnosis not present

## 2020-11-14 DIAGNOSIS — I5043 Acute on chronic combined systolic (congestive) and diastolic (congestive) heart failure: Secondary | ICD-10-CM | POA: Diagnosis not present

## 2020-11-14 DIAGNOSIS — Z430 Encounter for attention to tracheostomy: Secondary | ICD-10-CM | POA: Diagnosis not present

## 2020-11-14 DIAGNOSIS — J9311 Primary spontaneous pneumothorax: Secondary | ICD-10-CM | POA: Diagnosis not present

## 2020-11-14 DIAGNOSIS — N390 Urinary tract infection, site not specified: Secondary | ICD-10-CM | POA: Diagnosis not present

## 2020-11-14 DIAGNOSIS — I9589 Other hypotension: Secondary | ICD-10-CM | POA: Diagnosis present

## 2020-11-14 DIAGNOSIS — R0989 Other specified symptoms and signs involving the circulatory and respiratory systems: Secondary | ICD-10-CM | POA: Diagnosis not present

## 2020-11-14 DIAGNOSIS — R64 Cachexia: Secondary | ICD-10-CM | POA: Diagnosis present

## 2020-11-14 DIAGNOSIS — R000 Tachycardia, unspecified: Secondary | ICD-10-CM | POA: Diagnosis not present

## 2020-11-14 DIAGNOSIS — T17890A Other foreign object in other parts of respiratory tract causing asphyxiation, initial encounter: Secondary | ICD-10-CM | POA: Diagnosis present

## 2020-11-14 DIAGNOSIS — R0602 Shortness of breath: Secondary | ICD-10-CM | POA: Diagnosis not present

## 2020-11-14 DIAGNOSIS — Z820 Family history of epilepsy and other diseases of the nervous system: Secondary | ICD-10-CM | POA: Diagnosis not present

## 2020-11-14 DIAGNOSIS — J9611 Chronic respiratory failure with hypoxia: Secondary | ICD-10-CM | POA: Diagnosis not present

## 2020-11-14 DIAGNOSIS — J9811 Atelectasis: Secondary | ICD-10-CM | POA: Diagnosis not present

## 2020-11-14 DIAGNOSIS — K5900 Constipation, unspecified: Secondary | ICD-10-CM | POA: Diagnosis not present

## 2020-11-14 DIAGNOSIS — M2041 Other hammer toe(s) (acquired), right foot: Secondary | ICD-10-CM | POA: Diagnosis not present

## 2020-11-14 DIAGNOSIS — M8580 Other specified disorders of bone density and structure, unspecified site: Secondary | ICD-10-CM | POA: Diagnosis not present

## 2020-11-14 DIAGNOSIS — Z20822 Contact with and (suspected) exposure to covid-19: Secondary | ICD-10-CM | POA: Diagnosis present

## 2020-11-14 DIAGNOSIS — R059 Cough, unspecified: Secondary | ICD-10-CM | POA: Diagnosis not present

## 2020-11-14 DIAGNOSIS — Z87440 Personal history of urinary (tract) infections: Secondary | ICD-10-CM | POA: Diagnosis not present

## 2020-11-14 DIAGNOSIS — L89153 Pressure ulcer of sacral region, stage 3: Secondary | ICD-10-CM | POA: Diagnosis not present

## 2020-11-14 DIAGNOSIS — J449 Chronic obstructive pulmonary disease, unspecified: Secondary | ICD-10-CM | POA: Diagnosis present

## 2020-11-14 DIAGNOSIS — I509 Heart failure, unspecified: Secondary | ICD-10-CM | POA: Diagnosis not present

## 2020-11-14 DIAGNOSIS — I517 Cardiomegaly: Secondary | ICD-10-CM | POA: Diagnosis not present

## 2020-11-14 DIAGNOSIS — I5032 Chronic diastolic (congestive) heart failure: Secondary | ICD-10-CM | POA: Diagnosis not present

## 2020-11-14 LAB — BASIC METABOLIC PANEL
Anion gap: 10 (ref 5–15)
BUN: 22 mg/dL (ref 8–23)
CO2: 33 mmol/L — ABNORMAL HIGH (ref 22–32)
Calcium: 8.8 mg/dL — ABNORMAL LOW (ref 8.9–10.3)
Chloride: 94 mmol/L — ABNORMAL LOW (ref 98–111)
Creatinine, Ser: 0.45 mg/dL — ABNORMAL LOW (ref 0.61–1.24)
GFR, Estimated: >60 mL/min (ref >60)
Glucose, Bld: 104 mg/dL — ABNORMAL HIGH (ref 70–99)
Potassium: 4 mmol/L (ref 3.5–5.1)
Sodium: 137 mmol/L (ref 135–145)

## 2020-11-14 LAB — CBC
HCT: 31.1 % — ABNORMAL LOW (ref 39.0–52.0)
Hemoglobin: 9.9 g/dL — ABNORMAL LOW (ref 13.0–17.0)
MCH: 33.9 pg (ref 26.0–34.0)
MCHC: 31.8 g/dL (ref 30.0–36.0)
MCV: 106.5 fL — ABNORMAL HIGH (ref 80.0–100.0)
Platelets: 297 10*3/uL (ref 150–400)
RBC: 2.92 MIL/uL — ABNORMAL LOW (ref 4.22–5.81)
RDW: 16.8 % — ABNORMAL HIGH (ref 11.5–15.5)
WBC: 6.9 10*3/uL (ref 4.0–10.5)
nRBC: 0 % (ref 0.0–0.2)

## 2020-11-14 LAB — PHOSPHORUS: Phosphorus: 3.2 mg/dL (ref 2.5–4.6)

## 2020-11-14 LAB — MAGNESIUM: Magnesium: 2.1 mg/dL (ref 1.7–2.4)

## 2020-11-14 NOTE — Consult Note (Signed)
Referring Physician: S. Owens Shark, MD  Austin Davis is an 80 y.o. male.                       Chief Complaint: CHF  HPI: 80 years old white male with PMH of acute on chronic respiratory failure, atrial fibrillation, obstructive sleep apnea, COPD, s/p MV repair, 41 mm. Ascending aortic dilatation, CAD has congestive heart failure. His echocardiogram 2 months ago showed mild LV systolic dysfunction with EF 45 to 50 % and without significant MR.  Past Medical History:  Diagnosis Date   CHF (congestive heart failure) ([place])    Coronary artery disease    GI bleed 2019   hospitalized at [facility] for one week   Headache    since childhood   Low blood sugar    since childhood, controlled by diet   Mitral regurgitation    Mitral valve prolapse    Osteopenia 2021   Paroxysmal atrial fibrillation [facility])       Past Surgical History:  Procedure Laterality Date   COLONOSCOPY WITH PROPOFOL N/A 02/19/2017   Procedure: COLONOSCOPY WITH PROPOFOL;  Surgeon: Wilford Corner, MD;  Location: [facility] ENDOSCOPY;  Service: Endoscopy;  Laterality: N/A;   IR GASTROSTOMY TUBE MOD SED  10/18/2020   IR IVC FILTER PLMT / S&I /IMG GUID/MOD SED  11/01/2020   laser eye surgery for retina detachment     MITRAL VALVE REPAIR  01/2003   monitor  02/05/2006   polyp removal     TONSILLECTOMY     tooth removal     as a teenager   TRACHEOSTOMY TUBE PLACEMENT N/A 10/26/2020   Procedure: TRACHEOSTOMY;  Surgeon: Rozetta Nunnery, MD;  Location: [place];  Service: ENT;  Laterality: N/A;    Family History  Problem Relation Age of Onset   Heart disease Mother    CAD Mother    Migraines Mother    Heart failure Father    Skin cancer Father    Valvular heart disease Father        prolapsed valve   Tremor Father        "probably had a bit of tremor in his old age"   Heart failure Maternal Grandmother    Pneumonia Maternal Grandfather    Heart failure Paternal Grandmother    Stroke Paternal Grandfather    Asthma  Daughter    Epilepsy Son    Parkinson's disease Neg Hx    Social History:  reports that he has never smoked. He has never used smokeless tobacco. He reports current alcohol use. He reports that he does not use drugs.  Allergies:  Allergies  Allergen Reactions   Prednisone Other (See Comments)    Makes skin crawl, rapid HR   Cortisone Palpitations    No medications prior to admission.    Results for orders placed or performed during the hospital encounter of 09/29/20 (from the past 48 hour(s))  Resp Panel by RT-PCR (Flu A&B, Covid) Nasopharyngeal Swab     Status: None   Collection Time: 11/13/20  5:43 PM   Specimen: Nasopharyngeal Swab; Nasopharyngeal(NP) swabs in vial transport medium  Result Value Ref Range   SARS Coronavirus 2 by RT PCR NEGATIVE NEGATIVE    Comment: (NOTE) SARS-CoV-2 target nucleic acids are NOT DETECTED.  The SARS-CoV-2 RNA is generally detectable in upper respiratory specimens during the acute phase of infection. The lowest concentration of SARS-CoV-2 viral copies this assay can detect is 138 copies/mL. A  negative result does not preclude SARS-Cov-2 infection and should not be used as the sole basis for treatment or other patient management decisions. A negative result may occur with  improper specimen collection/handling, submission of specimen other than nasopharyngeal swab, presence of viral mutation(s) within the areas targeted by this assay, and inadequate number of viral copies(<138 copies/mL). A negative result must be combined with clinical observations, patient history, and epidemiological information. The expected result is Negative.  Fact Sheet for Patients:  EntrepreneurPulse.com.au  Fact Sheet for Healthcare Providers:  IncredibleEmployment.be  This test is no t yet approved or cleared by the [place] FDA and  has been authorized for detection and/or diagnosis of SARS-CoV-2 by FDA under an  Emergency Use Authorization (EUA). This EUA will remain  in effect (meaning this test can be used) for the duration of the COVID-19 declaration under Section 564(b)(1) of the Act, 21 U.S.C.section 360bbb-3(b)(1), unless the authorization is terminated  or revoked sooner.       Influenza A by PCR NEGATIVE NEGATIVE   Influenza B by PCR NEGATIVE NEGATIVE    Comment: (NOTE) The Xpert Xpress SARS-CoV-2/FLU/RSV plus assay is intended as an aid in the diagnosis of influenza from Nasopharyngeal swab specimens and should not be used as a sole basis for treatment. Nasal washings and aspirates are unacceptable for Xpert Xpress SARS-CoV-2/FLU/RSV testing.  Fact Sheet for Patients: EntrepreneurPulse.com.au  Fact Sheet for Healthcare Providers: IncredibleEmployment.be  This test is not yet approved or cleared by the [place] FDA and has been authorized for detection and/or diagnosis of SARS-CoV-2 by FDA under an Emergency Use Authorization (EUA). This EUA will remain in effect (meaning this test can be used) for the duration of the COVID-19 declaration under Section 564(b)(1) of the Act, 21 U.S.C. section 360bbb-3(b)(1), unless the authorization is terminated or revoked.  Performed at [facility], [place] [address], [place], [place] [address]   CBC     Status: Abnormal   Collection Time: 11/14/20  4:14 AM  Result Value Ref Range   WBC 6.9 4.0 - 10.5 K/uL   RBC 2.92 (L) 4.22 - 5.81 MIL/uL   Hemoglobin 9.9 (L) 13.0 - 17.0 g/dL   HCT 31.1 (L) 39.0 - 52.0 %   MCV 106.5 (H) 80.0 - 100.0 fL   MCH 33.9 26.0 - 34.0 pg   MCHC 31.8 30.0 - 36.0 g/dL   RDW 16.8 (H) 11.5 - 15.5 %   Platelets 297 150 - 400 K/uL   nRBC 0.0 0.0 - 0.2 %    Comment: Performed at [facility] [address], [place], [place] [address]  Basic metabolic panel     Status: Abnormal   Collection Time: 11/14/20  4:14 AM  Result Value Ref Range   Sodium 137 135 - 145  mmol/L   Potassium 4.0 3.5 - 5.1 mmol/L   Chloride 94 (L) 98 - 111 mmol/L   CO2 33 (H) 22 - 32 mmol/L   Glucose, Bld 104 (H) 70 - 99 mg/dL    Comment: Glucose reference range applies only to samples taken after fasting for at least 8 hours.   BUN 22 8 - 23 mg/dL   Creatinine, Ser 0.45 (L) 0.61 - 1.24 mg/dL   Calcium 8.8 (L) 8.9 - 10.3 mg/dL   GFR, Estimated >60 >60 mL/min    Comment: (NOTE) Calculated using the CKD-EPI Creatinine Equation (2021)    Anion gap 10 5 - 15    Comment: Performed at [facility]  [facility], [place] [address] [place], [place] [address]  Magnesium     Status: None   Collection Time: 11/14/20  4:14 AM  Result Value Ref Range   Magnesium 2.1 1.7 - 2.4 mg/dL    Comment: Performed at [facility] [address], [place], [place] [address]  Phosphorus     Status: None   Collection Time: 11/14/20  4:14 AM  Result Value Ref Range   Phosphorus 3.2 2.5 - 4.6 mg/dL    Comment: Performed at [facility] [address], [place], [place] [address]   No results found.  Review Of Systems As per HPI.   Blood pressure 93/67, pulse 63, resp. rate 23, SpO2 95 %. On 30 % FiO2 and pressure support ventilation.  There is no height or weight on file to calculate BMI. General appearance: awakens easily.appears stated age and in moderate respiratory distress Head: Normocephalic, atraumatic. Eyes: Blue eyes, Pale pink conjunctiva, corneas clear.  Neck: No adenopathy, no carotid bruit, no JVD, supple, symmetrical, trachea midline and thyroid not enlarged. Resp: Clearing to auscultation bilaterally. Cardio: Regular rate and rhythm, S1, S2 normal, II/VI systolic murmur, no click, rub or gallop GI: Soft, non-tender; bowel sounds normal; no organomegaly. Extremities: No edema, cyanosis or clubbing. Skin: Warm and dry.  Neurologic: Alert and oriented X 0, normal strength.  Assessment/Plan Congestive heart failure, compensated. Acute on chronic respiratory failure with  hypoxia COPD OSA Anemia of chronic disease Paroxysmal atrial fibrillation, CHA2DS2VASc score of 4 S/P MV repair/annuloplasty ring  Plan: Consider low dose warfarin use with INR goal 1.8 to 2.2 Continue low dose Beta blocker, diltiazem and furosemide as tolerated.  Time spent: Review of old records, Lab, x-rays, EKG, other cardiac tests, examination, discussion with patient/Nurse/PA over 70 minutes.  Birdie Riddle, MD  11/14/2020, 4:39 PM

## 2020-11-14 NOTE — Progress Notes (Signed)
Pulmonary Critical Care Medicine [place]  PROGRESS NOTE     Austin Davis  JQB:[id number]  DOB: 01-05-1941   DOA: 09/29/2020  Referring Physician: Satira Sark, MD  HPI: Austin Davis is a 80 y.o. male being followed for ventilator/airway/oxygen weaning Acute on Chronic Respiratory Failure.  Patient continues to have difficulty with the weaning he has not been able to do beyond 3 hours yesterday.  Today is on pressure support 12/5  Medications: Reviewed on Rounds  Physical Exam:  Vitals: Temperature is 97.7 pulse 69 respiratory is 20 blood pressure is 108/54  Ventilator Settings on pressure support FiO2 is 40% pressure 12/5  General: Comfortable at this time Neck: supple Cardiovascular: no malignant arrhythmias Respiratory: Scattered rhonchi are noted bilaterally Skin: no rash seen on limited exam Musculoskeletal: No gross abnormality Psychiatric:unable to assess Neurologic:no involuntary movements         Lab Data:   Basic Metabolic Panel: Recent Labs  Lab 11/09/20 0448 11/12/20 0737 11/14/20 0414  NA 134* 137 137  K 4.0 3.9 4.0  CL 95* 92* 94*  CO2 34* 37* 33*  GLUCOSE 119* 63* 104*  BUN 25* 24* 22  CREATININE 0.35* 0.39* 0.45*  CALCIUM 8.6* 8.7* 8.8*  MG 2.1 2.1 2.1  PHOS 2.9 3.4 3.2    ABG: No results for input(s): PHART, PCO2ART, PO2ART, HCO3, O2SAT in the last 168 hours.  Liver Function Tests: Recent Labs  Lab 11/09/20 0448  ALBUMIN 2.4*   No results for input(s): LIPASE, AMYLASE in the last 168 hours. No results for input(s): AMMONIA in the last 168 hours.  CBC: Recent Labs  Lab 11/09/20 0448 11/12/20 0737 11/14/20 0414  WBC 9.3 7.2 6.9  HGB 10.2* 9.7* 9.9*  HCT 31.2* 30.6* 31.1*  MCV 104.0* 105.5* 106.5*  PLT 278 303 297    Cardiac Enzymes: No results for input(s): CKTOTAL, CKMB, CKMBINDEX, TROPONINI in the last 168 hours.  BNP (last 3 results) Recent Labs     09/19/20 1606  BNP 876.9*    ProBNP (last 3 results) No results for input(s): PROBNP in the last 8760 hours.  Radiological Exams: No results found.  Assessment/Plan Active Problems:   Acute on chronic respiratory failure with hypoxia and hypercapnia (HCC)   AF (paroxysmal atrial fibrillation) (HCC)   Obstructive sleep apnea   COPD, severe (HCC)   Chronic heart failure with preserved ejection fraction (HCC)   Acute on chronic respiratory failure hypoxia plan is to continue with the wean attempt.  Not been tolerating consistently.  I did review his previous echo noted to have a reduced ejection fraction so I recommended doing a follow-up echo and Cardiology to help optimize Chronic heart failure preserved ejection fraction his EF was 45% on the last echo we will get a follow-up echo and also get cardiology to help optimize his status Atrial fibrillation rate now rate is controlled Severe COPD medical management Obstructive sleep apnea no change   I have personally seen and evaluated the patient, evaluated laboratory and imaging results, formulated the assessment and plan and placed orders. The Patient requires high complexity decision making with multiple systems involvement.  Rounds were done with the Respiratory Therapy Director and Staff therapists and discussed with nursing staff also.  Allyne Gee, MD [facility] Pulmonary Critical Care Medicine Sleep Medicine

## 2020-11-17 DIAGNOSIS — J9600 Acute respiratory failure, unspecified whether with hypoxia or hypercapnia: Secondary | ICD-10-CM | POA: Diagnosis not present

## 2020-11-20 DIAGNOSIS — J9600 Acute respiratory failure, unspecified whether with hypoxia or hypercapnia: Secondary | ICD-10-CM | POA: Diagnosis not present

## 2020-12-03 DIAGNOSIS — J9600 Acute respiratory failure, unspecified whether with hypoxia or hypercapnia: Secondary | ICD-10-CM | POA: Diagnosis not present

## 2020-12-05 ENCOUNTER — Other Ambulatory Visit: Payer: Self-pay | Admitting: Interventional Radiology

## 2020-12-05 DIAGNOSIS — Z95828 Presence of other vascular implants and grafts: Secondary | ICD-10-CM

## 2020-12-07 DIAGNOSIS — J9600 Acute respiratory failure, unspecified whether with hypoxia or hypercapnia: Secondary | ICD-10-CM | POA: Diagnosis not present

## 2020-12-17 DIAGNOSIS — J9600 Acute respiratory failure, unspecified whether with hypoxia or hypercapnia: Secondary | ICD-10-CM | POA: Diagnosis not present

## 2020-12-20 DIAGNOSIS — E569 Vitamin deficiency, unspecified: Secondary | ICD-10-CM | POA: Diagnosis not present

## 2020-12-20 DIAGNOSIS — R059 Cough, unspecified: Secondary | ICD-10-CM | POA: Diagnosis not present

## 2020-12-20 DIAGNOSIS — J9620 Acute and chronic respiratory failure, unspecified whether with hypoxia or hypercapnia: Secondary | ICD-10-CM | POA: Diagnosis not present

## 2020-12-20 DIAGNOSIS — I2510 Atherosclerotic heart disease of native coronary artery without angina pectoris: Secondary | ICD-10-CM | POA: Diagnosis not present

## 2020-12-20 DIAGNOSIS — I959 Hypotension, unspecified: Secondary | ICD-10-CM | POA: Diagnosis not present

## 2020-12-20 DIAGNOSIS — K5900 Constipation, unspecified: Secondary | ICD-10-CM | POA: Diagnosis not present

## 2020-12-20 DIAGNOSIS — G250 Essential tremor: Secondary | ICD-10-CM | POA: Diagnosis not present

## 2020-12-20 DIAGNOSIS — Z7901 Long term (current) use of anticoagulants: Secondary | ICD-10-CM | POA: Diagnosis not present

## 2020-12-20 DIAGNOSIS — J188 Other pneumonia, unspecified organism: Secondary | ICD-10-CM | POA: Diagnosis not present

## 2020-12-20 DIAGNOSIS — R0603 Acute respiratory distress: Secondary | ICD-10-CM | POA: Diagnosis present

## 2020-12-20 DIAGNOSIS — I509 Heart failure, unspecified: Secondary | ICD-10-CM | POA: Diagnosis not present

## 2020-12-20 DIAGNOSIS — G319 Degenerative disease of nervous system, unspecified: Secondary | ICD-10-CM | POA: Diagnosis not present

## 2020-12-20 DIAGNOSIS — I4891 Unspecified atrial fibrillation: Secondary | ICD-10-CM | POA: Diagnosis not present

## 2020-12-20 DIAGNOSIS — F419 Anxiety disorder, unspecified: Secondary | ICD-10-CM | POA: Diagnosis not present

## 2020-12-20 DIAGNOSIS — K219 Gastro-esophageal reflux disease without esophagitis: Secondary | ICD-10-CM | POA: Diagnosis not present

## 2020-12-20 DIAGNOSIS — J9600 Acute respiratory failure, unspecified whether with hypoxia or hypercapnia: Secondary | ICD-10-CM | POA: Diagnosis not present

## 2020-12-20 DIAGNOSIS — E039 Hypothyroidism, unspecified: Secondary | ICD-10-CM | POA: Diagnosis not present

## 2020-12-20 DIAGNOSIS — M6281 Muscle weakness (generalized): Secondary | ICD-10-CM | POA: Diagnosis not present

## 2020-12-20 DIAGNOSIS — J449 Chronic obstructive pulmonary disease, unspecified: Secondary | ICD-10-CM | POA: Diagnosis not present

## 2020-12-20 DIAGNOSIS — I5023 Acute on chronic systolic (congestive) heart failure: Secondary | ICD-10-CM | POA: Diagnosis not present

## 2020-12-20 DIAGNOSIS — T7840XD Allergy, unspecified, subsequent encounter: Secondary | ICD-10-CM | POA: Diagnosis not present

## 2020-12-20 DIAGNOSIS — J690 Pneumonitis due to inhalation of food and vomit: Secondary | ICD-10-CM | POA: Diagnosis not present

## 2020-12-20 DIAGNOSIS — Z431 Encounter for attention to gastrostomy: Secondary | ICD-10-CM | POA: Diagnosis not present

## 2020-12-20 DIAGNOSIS — I480 Paroxysmal atrial fibrillation: Secondary | ICD-10-CM | POA: Diagnosis not present

## 2020-12-20 DIAGNOSIS — J189 Pneumonia, unspecified organism: Secondary | ICD-10-CM | POA: Diagnosis not present

## 2020-12-20 DIAGNOSIS — R1310 Dysphagia, unspecified: Secondary | ICD-10-CM | POA: Diagnosis not present

## 2020-12-20 DIAGNOSIS — J90 Pleural effusion, not elsewhere classified: Secondary | ICD-10-CM | POA: Diagnosis not present

## 2020-12-20 DIAGNOSIS — R627 Adult failure to thrive: Secondary | ICD-10-CM | POA: Diagnosis not present

## 2020-12-20 DIAGNOSIS — E43 Unspecified severe protein-calorie malnutrition: Secondary | ICD-10-CM | POA: Diagnosis not present

## 2020-12-20 DIAGNOSIS — I82402 Acute embolism and thrombosis of unspecified deep veins of left lower extremity: Secondary | ICD-10-CM | POA: Diagnosis not present

## 2020-12-20 DIAGNOSIS — Z430 Encounter for attention to tracheostomy: Secondary | ICD-10-CM | POA: Diagnosis not present

## 2020-12-20 DIAGNOSIS — Z87440 Personal history of urinary (tract) infections: Secondary | ICD-10-CM | POA: Diagnosis not present

## 2020-12-20 DIAGNOSIS — R0602 Shortness of breath: Secondary | ICD-10-CM | POA: Diagnosis not present

## 2020-12-20 DIAGNOSIS — G4733 Obstructive sleep apnea (adult) (pediatric): Secondary | ICD-10-CM | POA: Diagnosis not present

## 2020-12-31 DIAGNOSIS — J9600 Acute respiratory failure, unspecified whether with hypoxia or hypercapnia: Secondary | ICD-10-CM | POA: Diagnosis not present

## 2021-01-03 DIAGNOSIS — J9600 Acute respiratory failure, unspecified whether with hypoxia or hypercapnia: Secondary | ICD-10-CM | POA: Diagnosis not present

## 2021-01-14 ENCOUNTER — Emergency Department (HOSPITAL_COMMUNITY): Payer: Medicare Other

## 2021-01-14 ENCOUNTER — Emergency Department (HOSPITAL_COMMUNITY)
Admission: EM | Admit: 2021-01-14 | Discharge: 2021-01-14 | Disposition: A | Discharge status: Other Acute Inpatient Hospital | Payer: Medicare Other | Attending: Emergency Medicine | Admitting: Emergency Medicine

## 2021-01-14 ENCOUNTER — Other Ambulatory Visit: Payer: Self-pay

## 2021-01-14 DIAGNOSIS — R059 Cough, unspecified: Secondary | ICD-10-CM | POA: Diagnosis not present

## 2021-01-14 DIAGNOSIS — J449 Chronic obstructive pulmonary disease, unspecified: Secondary | ICD-10-CM | POA: Insufficient documentation

## 2021-01-14 DIAGNOSIS — R0602 Shortness of breath: Secondary | ICD-10-CM | POA: Diagnosis not present

## 2021-01-14 DIAGNOSIS — I2510 Atherosclerotic heart disease of native coronary artery without angina pectoris: Secondary | ICD-10-CM | POA: Insufficient documentation

## 2021-01-14 DIAGNOSIS — I509 Heart failure, unspecified: Secondary | ICD-10-CM | POA: Insufficient documentation

## 2021-01-14 DIAGNOSIS — J189 Pneumonia, unspecified organism: Secondary | ICD-10-CM | POA: Diagnosis not present

## 2021-01-14 DIAGNOSIS — Z7901 Long term (current) use of anticoagulants: Secondary | ICD-10-CM | POA: Insufficient documentation

## 2021-01-14 DIAGNOSIS — R0603 Acute respiratory distress: Secondary | ICD-10-CM | POA: Diagnosis present

## 2021-01-14 DIAGNOSIS — J188 Other pneumonia, unspecified organism: Secondary | ICD-10-CM | POA: Diagnosis not present

## 2021-01-14 LAB — COMPREHENSIVE METABOLIC PANEL
ALT: 20 U/L (ref 0–44)
AST: 20 U/L (ref 15–41)
Albumin: 3.5 g/dL (ref 3.5–5.0)
Alkaline Phosphatase: 138 U/L — ABNORMAL HIGH (ref 38–126)
Anion gap: 11 (ref 5–15)
BUN: 22 mg/dL (ref 8–23)
CO2: 28 mmol/L (ref 22–32)
Calcium: 9.5 mg/dL (ref 8.9–10.3)
Chloride: 98 mmol/L (ref 98–111)
Creatinine, Ser: 0.57 mg/dL — ABNORMAL LOW (ref 0.61–1.24)
GFR, Estimated: >60 mL/min (ref >60)
Glucose, Bld: 126 mg/dL — ABNORMAL HIGH (ref 70–99)
Potassium: 4 mmol/L (ref 3.5–5.1)
Sodium: 137 mmol/L (ref 135–145)
Total Bilirubin: 0.5 mg/dL (ref 0.3–1.2)
Total Protein: 7.3 g/dL (ref 6.5–8.1)

## 2021-01-14 LAB — I-STAT ARTERIAL BLOOD GAS, ED
Acid-Base Excess: 4 mmol/L — ABNORMAL HIGH (ref 0.0–2.0)
Bicarbonate: 30.1 mmol/L — ABNORMAL HIGH (ref 20.0–28.0)
Calcium, Ion: 1.26 mmol/L (ref 1.15–1.40)
HCT: 39 % (ref 39.0–52.0)
Hemoglobin: 13.3 g/dL (ref 13.0–17.0)
O2 Saturation: 98 %
Potassium: 4.1 mmol/L (ref 3.5–5.1)
Sodium: 137 mmol/L (ref 135–145)
TCO2: 32 mmol/L (ref 22–32)
pCO2 arterial: 49.9 mmHg — ABNORMAL HIGH (ref 32.0–48.0)
pH, Arterial: 7.388 (ref 7.350–7.450)
pO2, Arterial: 108 mmHg (ref 83.0–108.0)

## 2021-01-14 LAB — CBC WITH DIFFERENTIAL/PLATELET
Abs Immature Granulocytes: 0.07 10*3/uL (ref 0.00–0.07)
Basophils Absolute: 0 10*3/uL (ref 0.0–0.1)
Basophils Relative: 0 %
Eosinophils Absolute: 0.1 10*3/uL (ref 0.0–0.5)
Eosinophils Relative: 1 %
HCT: 42.1 % (ref 39.0–52.0)
Hemoglobin: 13.2 g/dL (ref 13.0–17.0)
Immature Granulocytes: 1 %
Lymphocytes Relative: 5 %
Lymphs Abs: 0.7 10*3/uL (ref 0.7–4.0)
MCH: 29.9 pg (ref 26.0–34.0)
MCHC: 31.4 g/dL (ref 30.0–36.0)
MCV: 95.2 fL (ref 80.0–100.0)
Monocytes Absolute: 0.8 10*3/uL (ref 0.1–1.0)
Monocytes Relative: 6 %
Neutro Abs: 11.9 10*3/uL — ABNORMAL HIGH (ref 1.7–7.7)
Neutrophils Relative %: 87 %
Platelets: 329 10*3/uL (ref 150–400)
RBC: 4.42 MIL/uL (ref 4.22–5.81)
RDW: 14.1 % (ref 11.5–15.5)
WBC: 13.5 10*3/uL — ABNORMAL HIGH (ref 4.0–10.5)
nRBC: 0 % (ref 0.0–0.2)

## 2021-01-14 LAB — TROPONIN I (HIGH SENSITIVITY): Troponin I (High Sensitivity): 5 ng/L (ref <18)

## 2021-01-14 LAB — BRAIN NATRIURETIC PEPTIDE: B Natriuretic Peptide: 119.2 pg/mL — ABNORMAL HIGH (ref 0.0–100.0)

## 2021-01-14 LAB — LACTIC ACID, PLASMA: Lactic Acid, Venous: 1.4 mmol/L (ref 0.5–1.9)

## 2021-01-14 IMAGING — DX DG CHEST 1V PORT
1 series · 1 of 1 positions shown · non-contrast
Comparison: [DATE]

CLINICAL DATA: Cough and shortness of breath.

EXAM:
PORTABLE CHEST 1 VIEW

[chest]
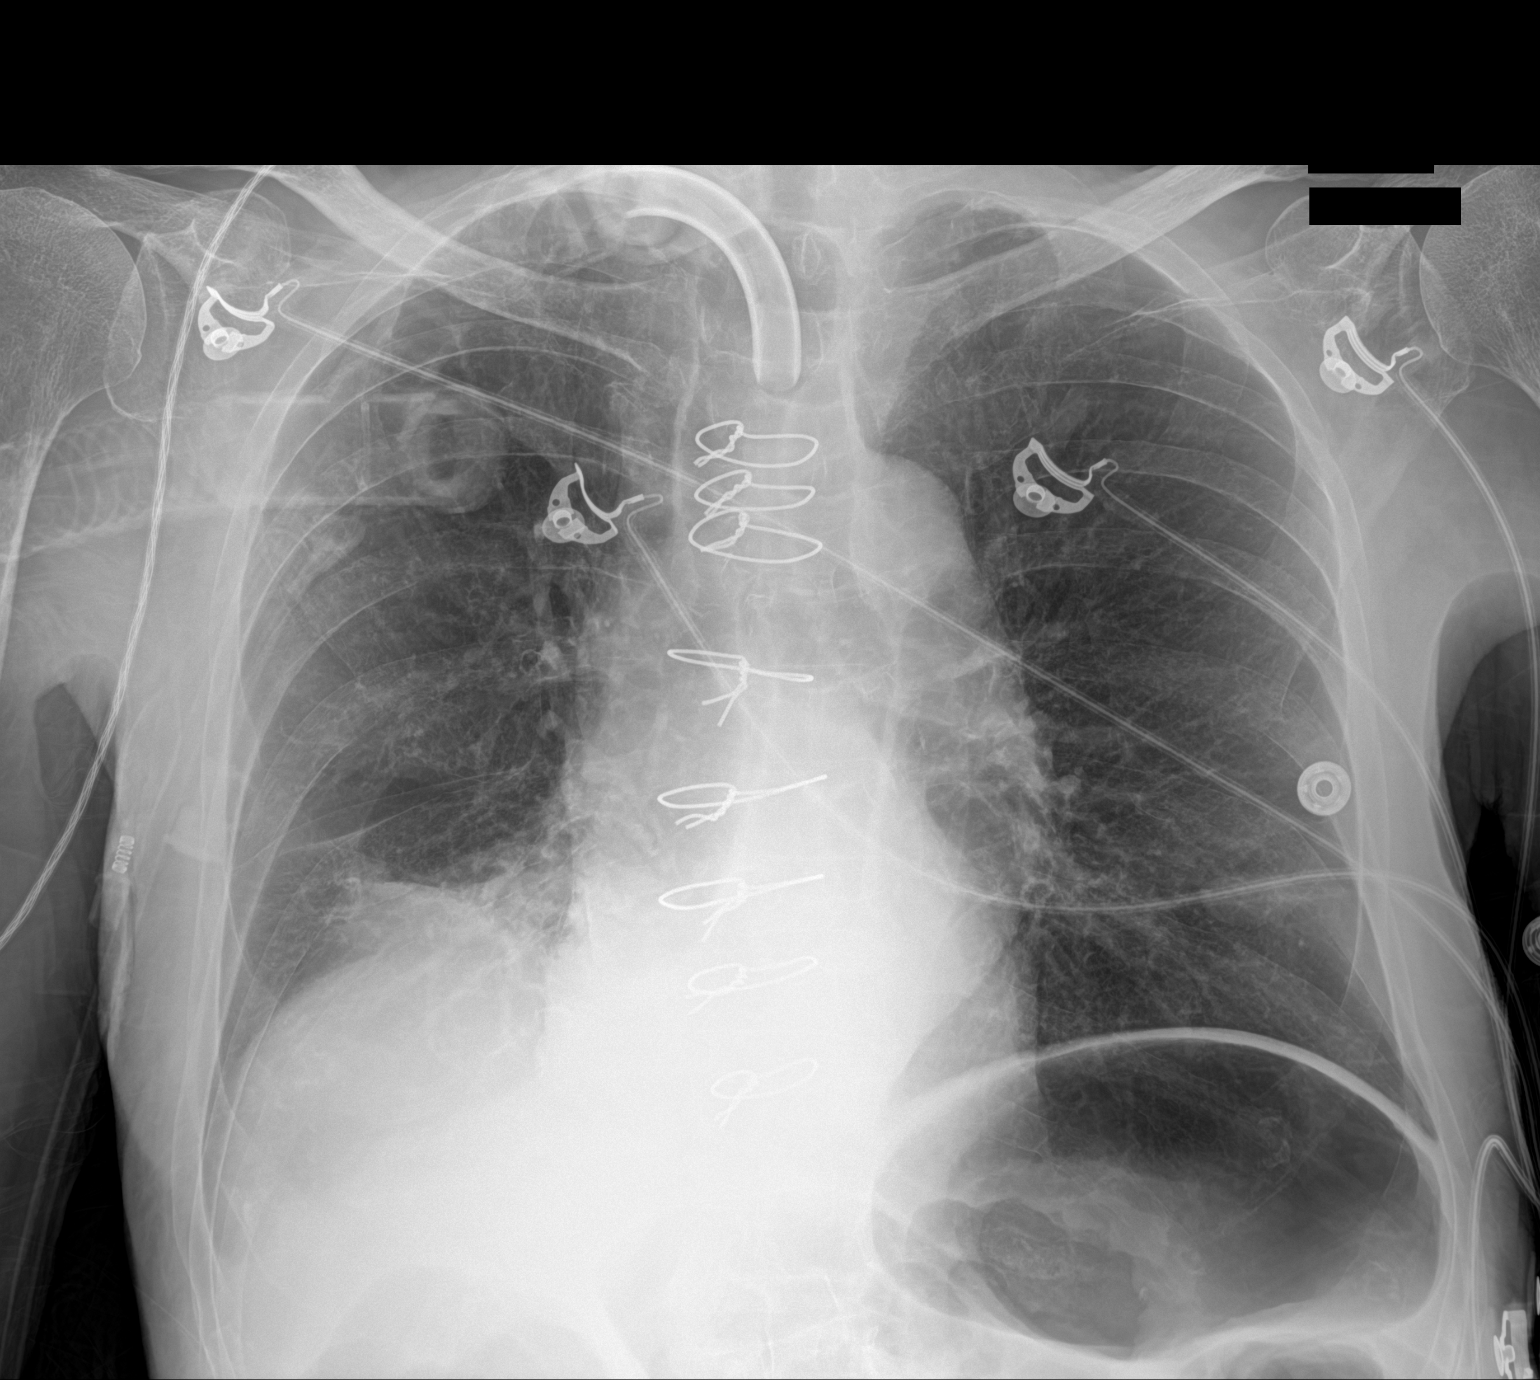

[1 of 1 positions shown; findings below may reference images not displayed]

FINDINGS: Tracheostomy tube again noted. Right base collapse/consolidation is
new in the interval. Left lung clear. The cardiopericardial
silhouette is within normal limits for size. Telemetry leads overlie
the chest. Prominent gastric bubble evident.
IMPRESSION: Right base collapse/consolidation new since prior.

## 2021-01-14 MED ORDER — PIPERACILLIN-TAZOBACTAM 3.375 G IVPB 30 MIN
3.3750 g | Freq: Once | INTRAVENOUS | Status: AC
  Administered 2021-01-14: 21:00:00 3.375 g via INTRAVENOUS
  Filled 2021-01-14: qty 50

## 2021-01-14 MED ORDER — VANCOMYCIN HCL IN DEXTROSE 1-5 GM/200ML-% IV SOLN
1000.0000 mg | Freq: Once | INTRAVENOUS | Status: AC
  Administered 2021-01-14: 21:00:00 1000 mg via INTRAVENOUS
  Filled 2021-01-14: qty 200

## 2021-01-14 NOTE — ED Notes (Signed)
Spoke w Idalia Needle RN at [facility] who stated we could send Vanc back with pt if transport was ready prior to infusion completing

## 2021-01-14 NOTE — ED Notes (Signed)
[facility] called to take patient back to [facility], about 3 ahead of him (about an hour)

## 2021-01-14 NOTE — Discharge Instructions (Addendum)
3Please have the physician on-call continue vancomycin and Zosyn to treat this apparent healthcare associated pneumonia.  This would be vancomycin 1 g twice daily and Zosyn 0.375 g every 6 hours, the pharmacist should get involved to check Vanco levels as well. every 6 hours 3.375g.  The pharmacist for your facility should be involved to check the levels of the vancomycin.  The dosing tonight was given around 9 PM.  Please communicate these medications and dosing to the physician on-call.  These medications were recommended by our ICU physicians  ER for worsening symtpoms.

## 2021-01-14 NOTE — ED Triage Notes (Signed)
Pt here via [facility] from [facility]. Pt was given scheduled benzo medication. Pt became grey and needed increased respiratory needs. Maxed out settings on vent and sent to ED  117/81 HR 84  20 RR 100% at 70%FiO2

## 2021-01-14 NOTE — ED Notes (Signed)
Report given to [facility] at bedside. To transport pt back to [facility]. Vancomycin currently infusing on pt departure. Report to [facility] given by [employer]. Pt leaving ED at this time

## 2021-01-14 NOTE — ED Provider Notes (Signed)
[facility] EMERGENCY DEPARTMENT Provider Note   CSN: [id number] Arrival date & time: 01/14/21  1501     History Chief Complaint  Patient presents with   Respiratory Distress    Austin Davis is a 80 y.o. male.  HPI  This patient is an 80 year old male who has a history of congestive heart failure coronary disease history of GI bleeding, mitral valve regurgitation paroxysmal atrial fibrillation.  He has had severe COPD, he has had SVT, metabolic encephalopathy, some degree of chronic respiratory failure after suffering with recurrent aspiration pneumonia of the left him with ARDS.  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  He has an IVC filter, mitral valve repair and has a current tracheostomy tube on a ventilator at [facility].  Report is primarily from the paramedics as the patient is nonverbal due to his tracheostomy stating that today the patient had motion that he was having some increasing shortness of breath, he was noted to have some lower oxygen and increased respiratory needs, he was given his scheduled benzodiazepine medications just prior to this.  Ultimately he required more and more positive airway pressure on the ventilator, the PEEP was at 10 which is the maximum for that unit, paramedics and critical care transport team found the patient have a normal oxygen level, ABG that showed a possible compensated metabolic acidosis, they were able to taper back on some of the settings, the patient is still able to nod to me that he feels a little short of breath, level 5 caveat applies secondary to the patient's difficulty speaking secondary to his chronic medical condition  Past Medical History:  Diagnosis Date   CHF (congestive heart failure) ([place])    Coronary artery disease    GI bleed 2019   hospitalized at [facility] for one week   Headache    since childhood   Low blood  sugar    since childhood, controlled by diet   Mitral regurgitation    Mitral valve prolapse    Osteopenia 2021   Paroxysmal atrial fibrillation [facility])     Patient Active Problem List   Diagnosis Date Noted   Obstructive sleep apnea 10/24/2020   COPD, severe ([place]) 10/24/2020   Chronic heart failure with preserved ejection fraction ([place]) 10/24/2020   Unintentional weight loss 95/18/8416   Acute metabolic encephalopathy 60/63/0160   SVT (supraventricular tachycardia) ([place]) 09/20/2020   Protein-calorie malnutrition, severe 09/17/2020   Acute on chronic respiratory failure with hypoxia and hypercapnia ([place]) 09/16/2020   Failure to thrive in adult 09/16/2020   AF (paroxysmal atrial fibrillation) ([place]) 09/16/2020   Stage 1 skin ulcer of sacral region ([place]) 09/16/2020   Hypotension 06/30/2020   GIB (gastrointestinal bleeding) 02/17/2017   Rectal bleeding 02/16/2017   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   Essential tremor 02/19/2015   Status post mitral valve annuloplasty and MAZE 2005 12/12/2014   HLD (hyperlipidemia) 05/05/2013   DOE (dyspnea on exertion) 04/22/2013   Fatigue 04/22/2013   Atrial fibrillation ([place]) 05/08/2012    Past Surgical History:  Procedure Laterality Date   COLONOSCOPY WITH PROPOFOL N/A 02/19/2017   Procedure: COLONOSCOPY WITH PROPOFOL;  Surgeon: Wilford Corner, MD;  Location: [facility] ENDOSCOPY;  Service: Endoscopy;  Laterality: N/A;   IR GASTROSTOMY TUBE MOD SED  10/18/2020   IR IVC FILTER PLMT / S&I /IMG GUID/MOD SED  11/01/2020   laser eye surgery for  retina detachment     MITRAL VALVE REPAIR  01/2003   monitor  02/05/2006   polyp removal     TONSILLECTOMY     tooth removal     as a teenager   TRACHEOSTOMY TUBE PLACEMENT N/A 10/26/2020   Procedure: TRACHEOSTOMY;  Surgeon: Rozetta Nunnery, MD;  Location: [facility];  Service: ENT;  Laterality: N/A;       Family History  Problem Relation Age of Onset   Heart disease Mother    CAD Mother     Migraines Mother    Heart failure Father    Skin cancer Father    Valvular heart disease Father        prolapsed valve   Tremor Father        "probably had a bit of tremor in his old age"   Heart failure Maternal Grandmother    Pneumonia Maternal Grandfather    Heart failure Paternal Grandmother    Stroke Paternal Grandfather    Asthma Daughter    Epilepsy Son    Parkinson's disease Neg Hx     Social History   Tobacco Use   Smoking status: Never   Smokeless tobacco: Never   Tobacco comments:    only tried a few cigarettes when he was younger  Substance Use Topics   Alcohol use: Yes    Comment: "probably a 6 pack of beer a year"   Drug use: No    Home Medications Prior to Admission medications   Medication Sig Start Date End Date Taking? Authorizing Provider  acetaminophen (TYLENOL) 500 MG tablet Take 500 mg by mouth every 6 (six) hours as needed for mild pain or headache.    [provider]  ALPRAZolam Duanne Moron) 0.25 MG tablet Take 0.125 mg by mouth at bedtime as needed for anxiety.    [provider]  Ascorbic Acid (VITAMIN C IMMUNE HEALTH PO) Take 500 mg by mouth.    [provider]  B Complex Vitamins (VITAMIN B COMPLEX PO) Take 1 tablet by mouth daily.    [provider]  Cholecalciferol (D3) 50 MCG (2000 UT) TABS Take 2,000 Units by mouth daily.    [provider]  diltiazem (CARDIZEM) 30 MG tablet Take 1 tablet (30 mg total) by mouth every 12 (twelve) hours. 09/28/20   [name], MD  feeding supplement (ENSURE ENLIVE / ENSURE PLUS) LIQD Take 237 mLs by mouth 2 (two) times daily between meals. 09/28/20   [name], MD  folic acid (FOLVITE) 1 MG tablet Take 1 tablet (1 mg total) by mouth daily. 09/29/20   [name], MD  Magnesium 250 MG TABS Take 250 mg by mouth daily.    [provider]  midodrine (PROAMATINE) 5 MG tablet Take 1 tablet (5 mg total) by mouth 3 (three) times daily with  meals. 09/28/20   [name], MD  mirtazapine (REMERON) 7.5 MG tablet Take 1 tablet (7.5 mg total) by mouth at bedtime. 09/28/20   [name], MD  Multiple Vitamin (MULTIVITAMIN WITH MINERALS) TABS tablet Take 1 tablet by mouth daily. 09/29/20   [name], MD  Omega-3 Fatty Acids (FISH OIL PO) Take 1 capsule by mouth daily.    [provider]  pantoprazole (PROTONIX) 40 MG tablet Take 1 tablet (40 mg total) by mouth 2 (two) times daily. 09/28/20   [name], MD  senna-docusate (SENOKOT-S) 8.6-50 MG tablet Take 1 tablet by mouth 2 (two) times  daily. 09/28/20   [name], MD  warfarin (COUMADIN) 1 MG tablet Take 1 tablet (1 mg total) by mouth daily. Note do not take warfarin 9/10 or 9/11 and call warfarin clinical for plan on 9/12 09/29/20   [name], MD  warfarin (COUMADIN) 5 MG tablet Note, do not take warfarin 9/10 or 9/11 and call warfarin clinic for plan on 9/12. TAKE 1/2 TO 1 TABLET DAILY AS DIRECTED BY COUMADIN CLINIC. 09/29/20   [name], MD    Allergies    Prednisone and Cortisone  Review of Systems   Review of Systems  Unable to perform ROS: Acuity of condition   Physical Exam Updated Vital Signs BP 120/82    Pulse 77    Resp 20    Ht 1.702 m (5\' 7" )    Wt 48.9 kg    SpO2 100%    BMI 16.88 kg/m   Physical Exam Vitals and nursing note reviewed.  Constitutional:      General: He is not in acute distress.    Appearance: He is well-developed. He is ill-appearing (Chronically).  HENT:     Head: Normocephalic and atraumatic.     Nose: Nose normal. No congestion or rhinorrhea.     Mouth/Throat:     Mouth: Mucous membranes are moist.     Pharynx: No oropharyngeal exudate.  Eyes:     General: No scleral icterus.       Right eye: No discharge.        Left eye: No discharge.     Conjunctiva/sclera: Conjunctivae normal.     Pupils: Pupils are equal, round, and reactive to light.  Neck:     Thyroid: No  thyromegaly.     Vascular: No JVD.  Cardiovascular:     Rate and Rhythm: Normal rate and regular rhythm.     Heart sounds: Normal heart sounds. No murmur heard.   No friction rub. No gallop.  Pulmonary:     Effort: Pulmonary effort is normal. No respiratory distress.     Breath sounds: Wheezing and rhonchi present. No rales.     Comments: The patient is requiring the ventilator on his tracheostomy however he is able to visually assist with some of these breaths, he has no rales but does have wheezing and rhonchi diffusely Abdominal:     General: Bowel sounds are normal. There is no distension.     Palpations: Abdomen is soft. There is no mass.     Tenderness: There is no abdominal tenderness.  Musculoskeletal:        General: No tenderness. Normal range of motion.     Cervical back: Normal range of motion and neck supple.     Right lower leg: No edema.     Left lower leg: No edema.  Lymphadenopathy:     Cervical: No cervical adenopathy.  Skin:    General: Skin is warm and dry.     Findings: No erythema or rash.  Neurological:     Mental Status: He is alert.     Coordination: Coordination normal.     Comments: There is no obvious facial droop, the patient does have what appears to be a tremor of his jaw and occasionally is able to voice a word to the point where it is audible enough to understand however that is it for communication.  He is able to lift both arms symmetrically lift both legs symmetrically and though he is generally weak he is symmetrical.  Psychiatric:        Behavior: Behavior normal.    ED Results / Procedures / Treatments   Labs (all labs ordered are listed, but only abnormal results are displayed) Labs Reviewed  CBC WITH DIFFERENTIAL/PLATELET - Abnormal; Notable for the following components:      Result Value   WBC 13.5 (*)    Neutro Abs 11.9 (*)    All other components within normal limits  COMPREHENSIVE METABOLIC PANEL - Abnormal; Notable for the  following components:   Glucose, Bld 126 (*)    Creatinine, Ser 0.57 (*)    Alkaline Phosphatase 138 (*)    All other components within normal limits  BRAIN NATRIURETIC PEPTIDE - Abnormal; Notable for the following components:   B Natriuretic Peptide 119.2 (*)    All other components within normal limits  I-STAT ARTERIAL BLOOD GAS, ED - Abnormal; Notable for the following components:   pCO2 arterial 49.9 (*)    Bicarbonate 30.1 (*)    Acid-Base Excess 4.0 (*)    All other components within normal limits  CULTURE, BLOOD (ROUTINE X 2)  CULTURE, BLOOD (ROUTINE X 2)  RESP PANEL BY RT-PCR (FLU A&B, COVID) ARPGX2  LACTIC ACID, PLASMA  BLOOD GAS, ARTERIAL  TROPONIN I (HIGH SENSITIVITY)    EKG None  Radiology DG Chest Port 1 View  Result Date: 01/14/2021 CLINICAL DATA:  Cough and shortness of breath. EXAM: PORTABLE CHEST 1 VIEW COMPARISON:  11/11/2020 FINDINGS: Tracheostomy tube again noted. Right base collapse/consolidation is new in the interval. Left lung clear. The cardiopericardial silhouette is within normal limits for size. Telemetry leads overlie the chest. Prominent gastric bubble evident. IMPRESSION: Right base collapse/consolidation new since prior. Electronically Signed   By: Misty Stanley M.D.   On: 01/14/2021 15:57    Procedures Procedures   Medications Ordered in ED Medications  piperacillin-tazobactam (ZOSYN) IVPB 3.375 g (has no administration in time range)  vancomycin (VANCOCIN) IVPB 1000 mg/200 mL premix (has no administration in time range)    ED Course  I have reviewed the triage vital signs and the nursing notes.  Pertinent labs & imaging results that were available during my care of the patient were reviewed by me and considered in my medical decision making (see chart for details).  Clinical Course as of 01/14/21 1955  Mon Jan 14, 2021  1535 Currently on 70% FiO2 and 10 PEEP [BM]    Clinical Course User Index [BM] Noemi Chapel, MD   MDM  Rules/Calculators/A&P                          The patient is currently having increasing amounts of shortness of breath for some reason, would rule out pneumonia, COVID, flu, mucous plugging, get a chest x-ray and labs.  The patient is agreeable  This patient presents to the ED for concern of increasing shortness of breath and work of breathing, this involves an extensive number of treatment options, and is a complaint that carries with it a high risk of complications and morbidity.  The differential diagnosis includes pneumonia, sepsis, lung collapse, bronchiectasis, mucous plugging, sepsis, less likely to be pulmonary embolism   Additional history obtained:  Additional history obtained from paramedics and external medical record External records from outside source obtained and reviewed including extensive review of the medical record showing that the patient had his recent tracheostomy, see HPI   Lab Tests:  I Ordered, reviewed, and interpreted labs.  The pertinent  results include: Lactic acid, blood cultures, CBC, metabolic panel.  Normal metabolic panel, CBC with a slight leukocytosis of both just over 13,000.  Lactic acid normal   Imaging Studies ordered:  I ordered imaging studies including portable chest x-ray I independently visualized and interpreted imaging which showed right lower lobe infiltrate or consolidation, possibly lung collapse I agree with the radiologist interpretation   Cardiac Monitoring:  The patient was maintained on a cardiac monitor.  I personally viewed and interpreted the cardiac monitored which showed an underlying rhythm of: Normal sinus rhythm with a borderline sinus tachycardia   Medicines ordered and prescription drug management:  I ordered medication including vancomycin and Zosyn for healthcare associated pneumonia on ventilator Reevaluation of the patient after these medicines showed that the patient improved I have reviewed the patients home  medicines and have made adjustments as needed   Critical Interventions:  Antibiotics, critical care consultation, cardiac monitoring   ED Course:  The patient maintain oxygenation of 100%, on the levels of positive pressure that he is getting at 10 as well as the FiO2 he should be able to go back to the area where he came from, I discussed his care with the critical care intensivist who agrees with vancomycin and Zosyn as initial antibiotics.  The patient's laboratory work-up does show a slight leukocytosis but otherwise his renal function is normal.   Consultations Obtained:  I requested consultation with the critical care intensivist,  and discussed lab and imaging findings as well as pertinent plan - they recommend: Antibiotic options were described and given in detail   Reevaluation:  After the interventions noted above, I reevaluated the patient and found that they have :improved   Dispostion:  After consideration of the diagnostic results and the patients response to treatment feel that the patent would benefit from discharge back to his nursing facility where he is on a ventilator  I personally spoke with Idalia Needle - the nurse taking care of the patient - she states that she will take the report and communicate with the MD and states that I do not need to talk t ot he MD on call - not in house evidently.         Final Clinical Impression(s) / ED Diagnoses Final diagnoses:  HCAP (healthcare-associated pneumonia)     Noemi Chapel, MD 01/14/21 1958

## 2021-01-16 DIAGNOSIS — J9600 Acute respiratory failure, unspecified whether with hypoxia or hypercapnia: Secondary | ICD-10-CM | POA: Diagnosis not present

## 2021-01-19 LAB — CULTURE, BLOOD (ROUTINE X 2)
Culture: NO GROWTH
Special Requests: ADEQUATE

## 2021-01-20 DIAGNOSIS — J9600 Acute respiratory failure, unspecified whether with hypoxia or hypercapnia: Secondary | ICD-10-CM | POA: Diagnosis not present

## 2021-01-28 DIAGNOSIS — J9600 Acute respiratory failure, unspecified whether with hypoxia or hypercapnia: Secondary | ICD-10-CM | POA: Diagnosis not present

## 2021-01-31 DIAGNOSIS — J9600 Acute respiratory failure, unspecified whether with hypoxia or hypercapnia: Secondary | ICD-10-CM | POA: Diagnosis not present

## 2021-02-11 DIAGNOSIS — J9620 Acute and chronic respiratory failure, unspecified whether with hypoxia or hypercapnia: Secondary | ICD-10-CM | POA: Diagnosis not present

## 2021-02-14 DIAGNOSIS — J9620 Acute and chronic respiratory failure, unspecified whether with hypoxia or hypercapnia: Secondary | ICD-10-CM | POA: Diagnosis not present

## 2021-02-25 DIAGNOSIS — J9620 Acute and chronic respiratory failure, unspecified whether with hypoxia or hypercapnia: Secondary | ICD-10-CM | POA: Diagnosis not present

## 2021-02-28 DIAGNOSIS — J9620 Acute and chronic respiratory failure, unspecified whether with hypoxia or hypercapnia: Secondary | ICD-10-CM | POA: Diagnosis not present

## 2021-03-11 DIAGNOSIS — J9620 Acute and chronic respiratory failure, unspecified whether with hypoxia or hypercapnia: Secondary | ICD-10-CM | POA: Diagnosis not present

## 2021-03-14 DIAGNOSIS — J9620 Acute and chronic respiratory failure, unspecified whether with hypoxia or hypercapnia: Secondary | ICD-10-CM | POA: Diagnosis not present

## 2021-03-25 ENCOUNTER — Emergency Department (HOSPITAL_COMMUNITY)
Admission: EM | Admit: 2021-03-25 | Discharge: 2021-03-25 | DRG: 178 | Disposition: A | Discharge status: Other Acute Inpatient Hospital | Payer: Medicare Other | Source: Other Acute Inpatient Hospital | Attending: Internal Medicine | Admitting: Internal Medicine

## 2021-03-25 ENCOUNTER — Emergency Department (HOSPITAL_COMMUNITY): Payer: Medicare Other

## 2021-03-25 DIAGNOSIS — Z8249 Family history of ischemic heart disease and other diseases of the circulatory system: Secondary | ICD-10-CM

## 2021-03-25 DIAGNOSIS — G20 Parkinson's disease: Secondary | ICD-10-CM | POA: Diagnosis not present

## 2021-03-25 DIAGNOSIS — I5032 Chronic diastolic (congestive) heart failure: Secondary | ICD-10-CM | POA: Diagnosis not present

## 2021-03-25 DIAGNOSIS — Z930 Tracheostomy status: Secondary | ICD-10-CM | POA: Diagnosis not present

## 2021-03-25 DIAGNOSIS — J9611 Chronic respiratory failure with hypoxia: Secondary | ICD-10-CM | POA: Diagnosis not present

## 2021-03-25 DIAGNOSIS — J690 Pneumonitis due to inhalation of food and vomit: Secondary | ICD-10-CM

## 2021-03-25 DIAGNOSIS — R0602 Shortness of breath: Secondary | ICD-10-CM | POA: Diagnosis not present

## 2021-03-25 DIAGNOSIS — I480 Paroxysmal atrial fibrillation: Secondary | ICD-10-CM | POA: Diagnosis not present

## 2021-03-25 DIAGNOSIS — Z825 Family history of asthma and other chronic lower respiratory diseases: Secondary | ICD-10-CM

## 2021-03-25 DIAGNOSIS — J9621 Acute and chronic respiratory failure with hypoxia: Secondary | ICD-10-CM | POA: Diagnosis not present

## 2021-03-25 DIAGNOSIS — I2510 Atherosclerotic heart disease of native coronary artery without angina pectoris: Secondary | ICD-10-CM | POA: Diagnosis present

## 2021-03-25 DIAGNOSIS — Z808 Family history of malignant neoplasm of other organs or systems: Secondary | ICD-10-CM | POA: Diagnosis not present

## 2021-03-25 DIAGNOSIS — Z888 Allergy status to other drugs, medicaments and biological substances status: Secondary | ICD-10-CM | POA: Diagnosis not present

## 2021-03-25 DIAGNOSIS — J9622 Acute and chronic respiratory failure with hypercapnia: Secondary | ICD-10-CM | POA: Diagnosis not present

## 2021-03-25 DIAGNOSIS — E785 Hyperlipidemia, unspecified: Secondary | ICD-10-CM | POA: Diagnosis not present

## 2021-03-25 DIAGNOSIS — Z20822 Contact with and (suspected) exposure to covid-19: Secondary | ICD-10-CM | POA: Diagnosis not present

## 2021-03-25 DIAGNOSIS — G4733 Obstructive sleep apnea (adult) (pediatric): Secondary | ICD-10-CM | POA: Diagnosis present

## 2021-03-25 DIAGNOSIS — J449 Chronic obstructive pulmonary disease, unspecified: Secondary | ICD-10-CM | POA: Diagnosis present

## 2021-03-25 DIAGNOSIS — R0603 Acute respiratory distress: Secondary | ICD-10-CM | POA: Diagnosis not present

## 2021-03-25 DIAGNOSIS — Z7901 Long term (current) use of anticoagulants: Secondary | ICD-10-CM

## 2021-03-25 DIAGNOSIS — Z79890 Hormone replacement therapy (postmenopausal): Secondary | ICD-10-CM

## 2021-03-25 DIAGNOSIS — Z79899 Other long term (current) drug therapy: Secondary | ICD-10-CM

## 2021-03-25 DIAGNOSIS — Z820 Family history of epilepsy and other diseases of the nervous system: Secondary | ICD-10-CM

## 2021-03-25 DIAGNOSIS — J984 Other disorders of lung: Secondary | ICD-10-CM | POA: Diagnosis not present

## 2021-03-25 DIAGNOSIS — Z931 Gastrostomy status: Secondary | ICD-10-CM

## 2021-03-25 DIAGNOSIS — F0150 Vascular dementia without behavioral disturbance: Secondary | ICD-10-CM | POA: Diagnosis present

## 2021-03-25 DIAGNOSIS — I340 Nonrheumatic mitral (valve) insufficiency: Secondary | ICD-10-CM | POA: Diagnosis present

## 2021-03-25 DIAGNOSIS — I700 Atherosclerosis of aorta: Secondary | ICD-10-CM | POA: Diagnosis not present

## 2021-03-25 DIAGNOSIS — J9620 Acute and chronic respiratory failure, unspecified whether with hypoxia or hypercapnia: Secondary | ICD-10-CM | POA: Diagnosis not present

## 2021-03-25 DIAGNOSIS — J90 Pleural effusion, not elsewhere classified: Secondary | ICD-10-CM | POA: Diagnosis not present

## 2021-03-25 DIAGNOSIS — M8580 Other specified disorders of bone density and structure, unspecified site: Secondary | ICD-10-CM | POA: Diagnosis present

## 2021-03-25 DIAGNOSIS — G20C Parkinsonism, unspecified: Secondary | ICD-10-CM | POA: Diagnosis present

## 2021-03-25 DIAGNOSIS — Z823 Family history of stroke: Secondary | ICD-10-CM | POA: Diagnosis not present

## 2021-03-25 DIAGNOSIS — R0600 Dyspnea, unspecified: Secondary | ICD-10-CM

## 2021-03-25 HISTORY — DX: Pneumonitis due to inhalation of food and vomit: J69.0

## 2021-03-25 LAB — COMPREHENSIVE METABOLIC PANEL
ALT: 34 U/L (ref 0–44)
AST: 25 U/L (ref 15–41)
Albumin: 3.3 g/dL — ABNORMAL LOW (ref 3.5–5.0)
Alkaline Phosphatase: 114 U/L (ref 38–126)
Anion gap: 11 (ref 5–15)
BUN: 32 mg/dL — ABNORMAL HIGH (ref 8–23)
CO2: 32 mmol/L (ref 22–32)
Calcium: 9.6 mg/dL (ref 8.9–10.3)
Chloride: 94 mmol/L — ABNORMAL LOW (ref 98–111)
Creatinine, Ser: 0.62 mg/dL (ref 0.61–1.24)
GFR, Estimated: >60 mL/min (ref >60)
Glucose, Bld: 144 mg/dL — ABNORMAL HIGH (ref 70–99)
Potassium: 4.6 mmol/L (ref 3.5–5.1)
Sodium: 137 mmol/L (ref 135–145)
Total Bilirubin: 0.4 mg/dL (ref 0.3–1.2)
Total Protein: 7.3 g/dL (ref 6.5–8.1)

## 2021-03-25 LAB — CBC WITH DIFFERENTIAL/PLATELET
Abs Immature Granulocytes: 0.07 10*3/uL (ref 0.00–0.07)
Basophils Absolute: 0 10*3/uL (ref 0.0–0.1)
Basophils Relative: 0 %
Eosinophils Absolute: 0 10*3/uL (ref 0.0–0.5)
Eosinophils Relative: 0 %
HCT: 40.7 % (ref 39.0–52.0)
Hemoglobin: 12.8 g/dL — ABNORMAL LOW (ref 13.0–17.0)
Immature Granulocytes: 0 %
Lymphocytes Relative: 3 %
Lymphs Abs: 0.5 10*3/uL — ABNORMAL LOW (ref 0.7–4.0)
MCH: 28.1 pg (ref 26.0–34.0)
MCHC: 31.4 g/dL (ref 30.0–36.0)
MCV: 89.5 fL (ref 80.0–100.0)
Monocytes Absolute: 0.7 10*3/uL (ref 0.1–1.0)
Monocytes Relative: 4 %
Neutro Abs: 18.1 10*3/uL — ABNORMAL HIGH (ref 1.7–7.7)
Neutrophils Relative %: 93 %
Platelets: 497 10*3/uL — ABNORMAL HIGH (ref 150–400)
RBC: 4.55 MIL/uL (ref 4.22–5.81)
RDW: 16.7 % — ABNORMAL HIGH (ref 11.5–15.5)
WBC: 19.4 10*3/uL — ABNORMAL HIGH (ref 4.0–10.5)
nRBC: 0 % (ref 0.0–0.2)

## 2021-03-25 LAB — I-STAT VENOUS BLOOD GAS, ED
Acid-Base Excess: 12 mmol/L — ABNORMAL HIGH (ref 0.0–2.0)
Bicarbonate: 38.3 mmol/L — ABNORMAL HIGH (ref 20.0–28.0)
Calcium, Ion: 1.23 mmol/L (ref 1.15–1.40)
HCT: 41 % (ref 39.0–52.0)
Hemoglobin: 13.9 g/dL (ref 13.0–17.0)
O2 Saturation: 99 %
Potassium: 4.5 mmol/L (ref 3.5–5.1)
Sodium: 138 mmol/L (ref 135–145)
TCO2: 40 mmol/L — ABNORMAL HIGH (ref 22–32)
pCO2, Ven: 55.4 mmHg (ref 44–60)
pH, Ven: 7.448 — ABNORMAL HIGH (ref 7.25–7.43)
pO2, Ven: 151 mmHg — ABNORMAL HIGH (ref 32–45)

## 2021-03-25 LAB — TROPONIN I (HIGH SENSITIVITY)
Troponin I (High Sensitivity): 4 ng/L (ref <18)
Troponin I (High Sensitivity): 4 ng/L

## 2021-03-25 LAB — RESP PANEL BY RT-PCR (FLU A&B, COVID) ARPGX2
Influenza A by PCR: NEGATIVE
Influenza B by PCR: NEGATIVE
SARS Coronavirus 2 by RT PCR: NEGATIVE

## 2021-03-25 LAB — LACTIC ACID, PLASMA: Lactic Acid, Venous: 1.4 mmol/L (ref 0.5–1.9)

## 2021-03-25 IMAGING — DX DG CHEST 1V PORT
1 series · 1 of 1 positions shown · non-contrast
Comparison: None.

CLINICAL DATA: Respiratory distress, possible aspiration.

EXAM:
PORTABLE CHEST 1 VIEW

[chest ap]
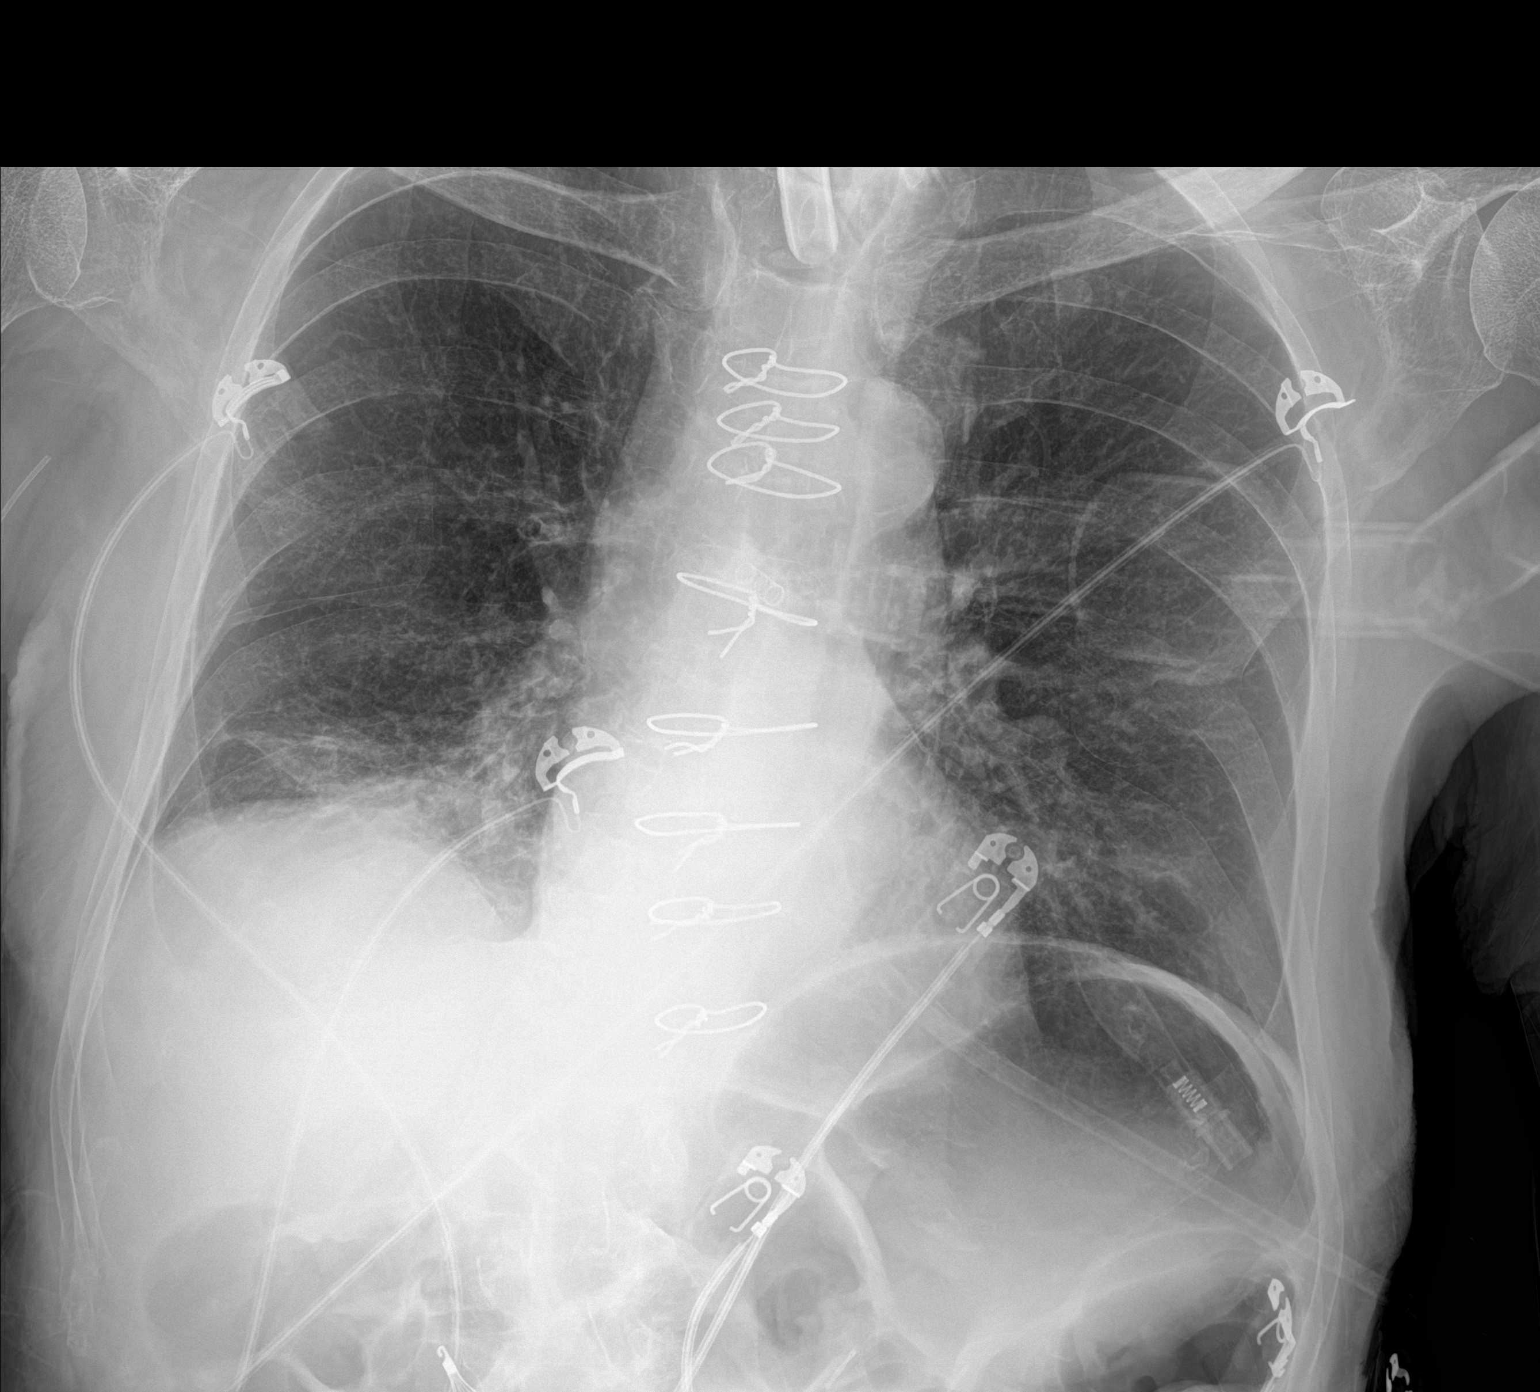

[1 of 1 positions shown; findings below may reference images not displayed]

FINDINGS: The heart size and mediastinal contours are stable. Atherosclerotic
calcification of the aorta is noted. The tracheostomy tube
terminates 6.7 cm above the carina. Patchy airspace disease is
present in the medial aspect of the right lower lobe with improved
aeration from the prior exam. The left lung is clear. No effusion or
pneumothorax. Sternotomy wires are noted over the midline.
IMPRESSION: Mild residual airspace disease in the medial aspect of the right
lung base with improved aeration from the prior exam.

## 2021-03-25 IMAGING — CT CT ANGIO CHEST
2 of 7 series · 17 of 46 positions shown · IV contrast (APPLIED)
Comparison: [DATE].

CLINICAL DATA: Pulmonary embolism suspected, high probability.

EXAM:
CT ANGIOGRAPHY CHEST WITH CONTRAST
TECHNIQUE: Multidetector CT imaging of the chest was performed using the
standard protocol during bolus administration of intravenous
contrast. Multiplanar CT image reconstructions and MIPs were
obtained to evaluate the vascular anatomy.

[Series 7: thins · axial · 0.73mm/px · z∈[-218,+42]mm · 14 of 420 slices shown]
[im 24/420  lung]
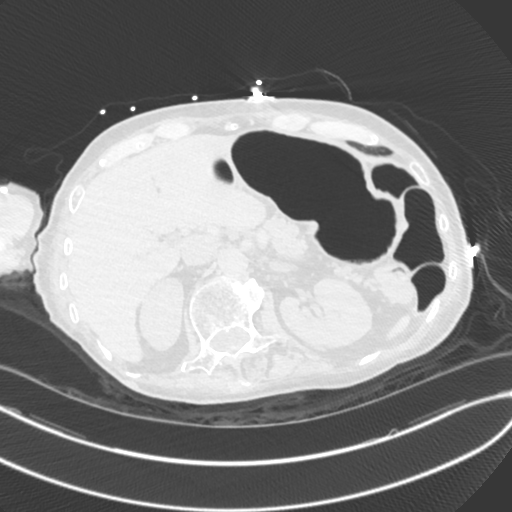
[im 47/420  soft-tissue]
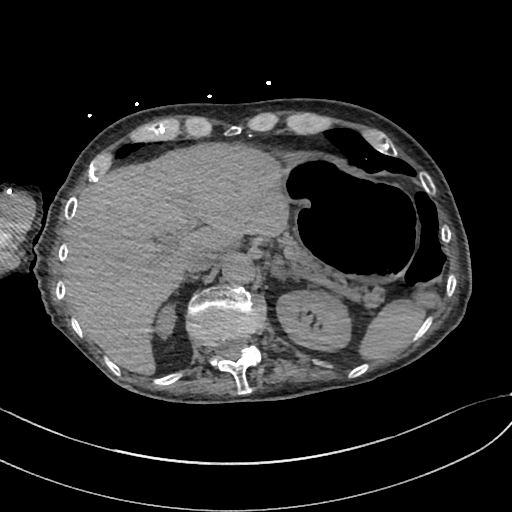
[im 94/420  lung]
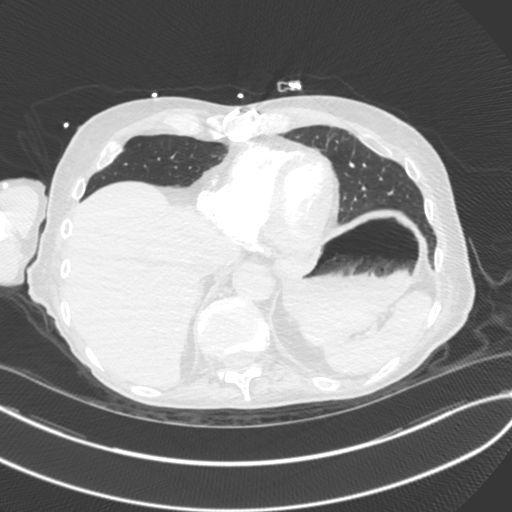
[im 117/420  soft-tissue]
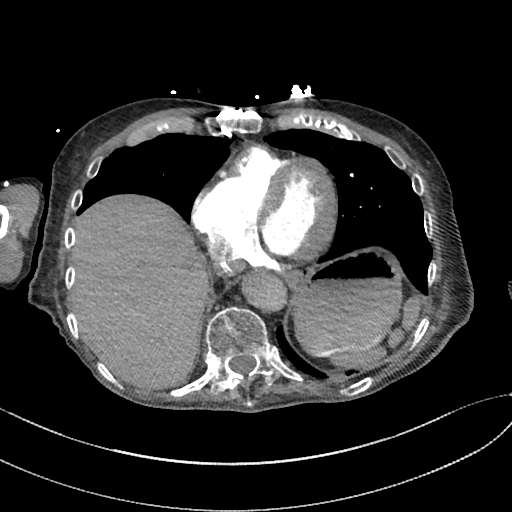
[im 140/420  lung]
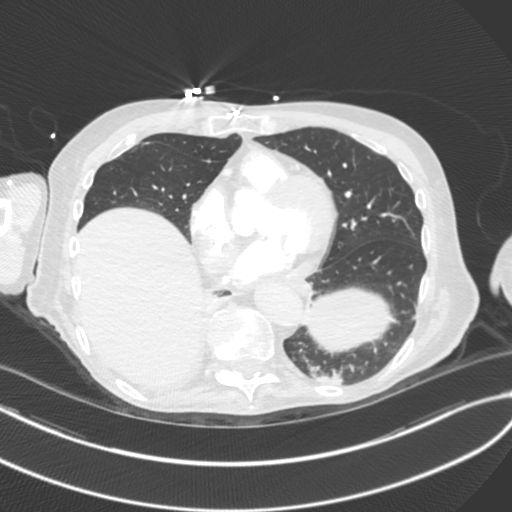
[im 163/420  soft-tissue]
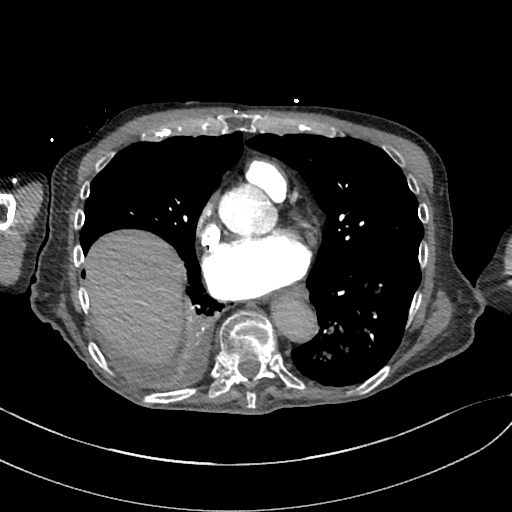
[im 187/420  lung]
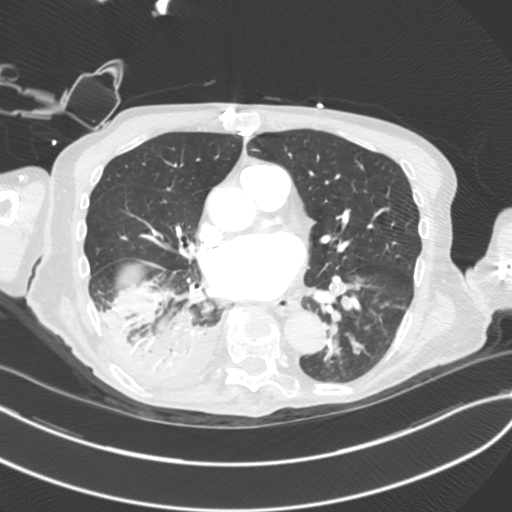
[im 233/420  soft-tissue]
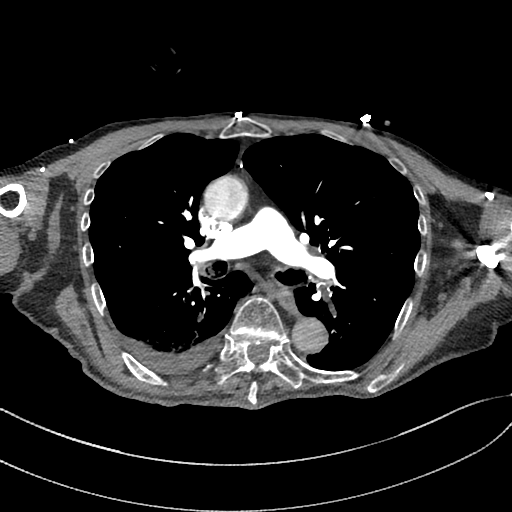
[im 257/420  lung]
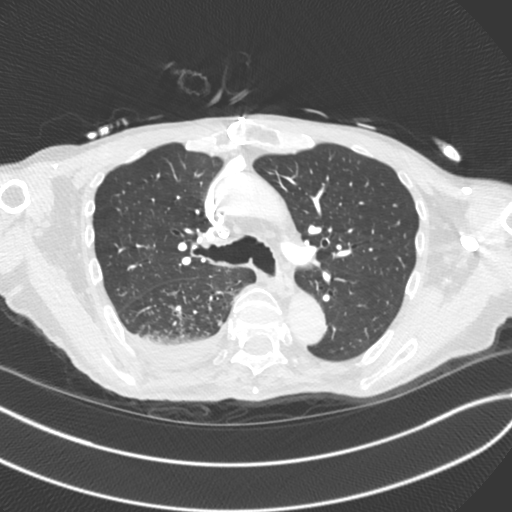
[im 280/420  soft-tissue]
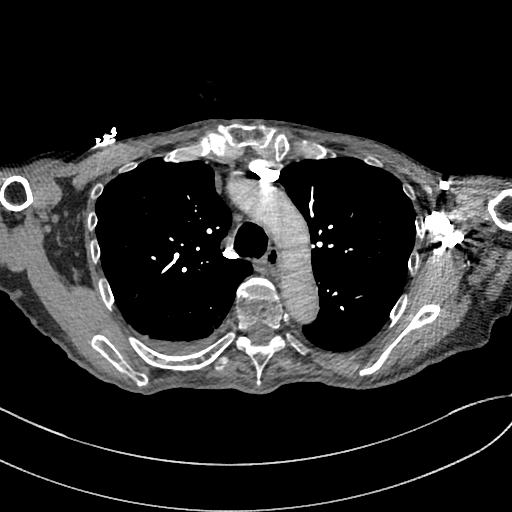
[im 303/420  lung]
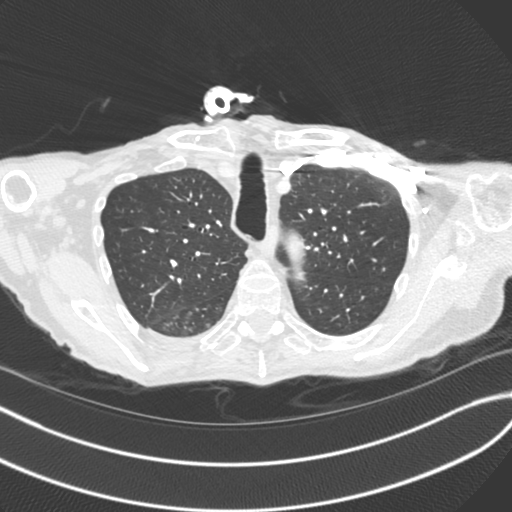
[im 326/420  soft-tissue]
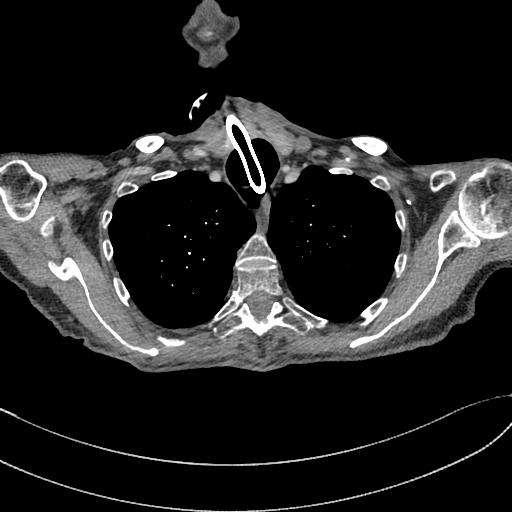
[im 373/420  lung]
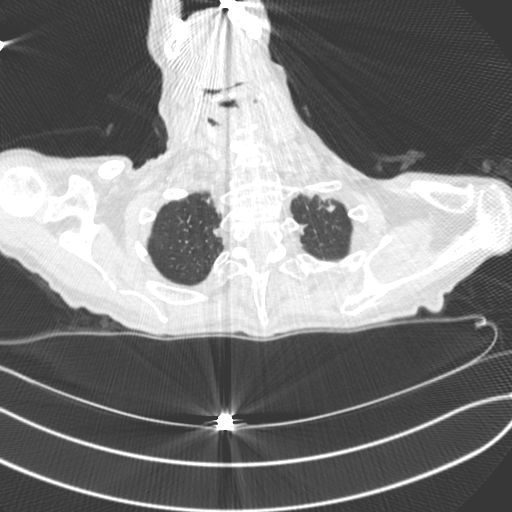
[im 396/420  soft-tissue]
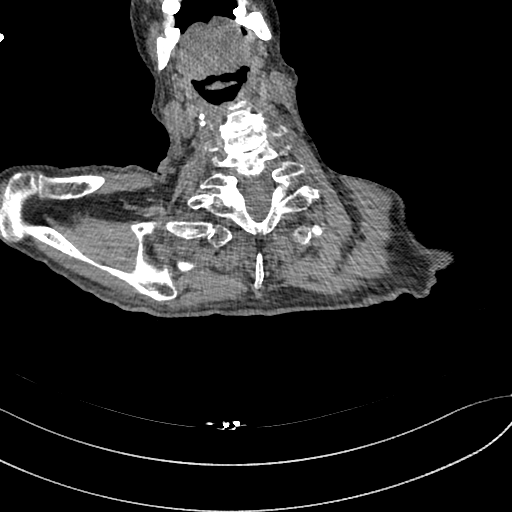

[Series 8: cor · coronal · 0.64mm/px · 3 of 182 slices shown]
[im 46/182  soft-tissue]
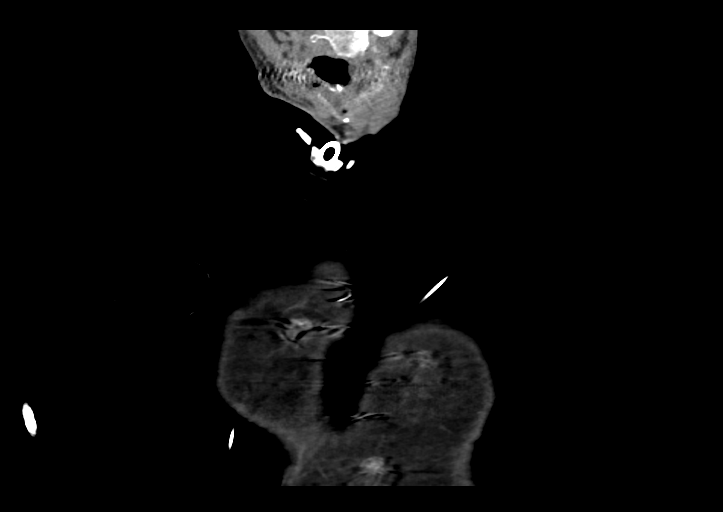
[im 91/182  soft-tissue]
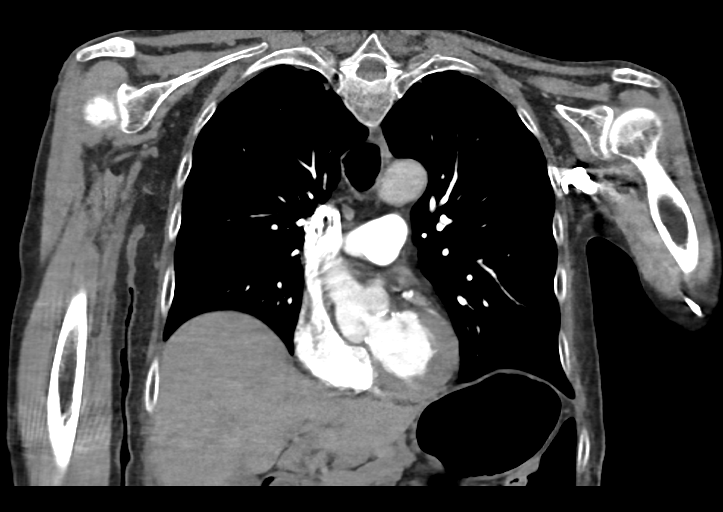
[im 136/182  soft-tissue]
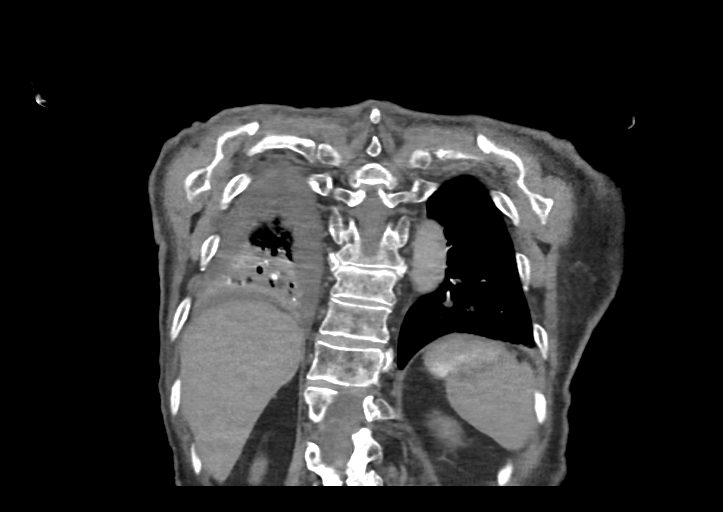

[17 of 46 positions shown; findings below may reference images not displayed]

RADIATION DOSE REDUCTION: This exam was performed according to the
departmental dose-optimization program which includes automated
exposure control, adjustment of the mA and/or kV according to
patient size and/or use of iterative reconstruction technique.

CONTRAST:  60mL OMNIPAQUE IOHEXOL 350 MG/ML SOLN
FINDINGS: Cardiovascular: The heart is normal in size and there is no
pericardial effusion. Scattered coronary artery calcifications are
noted. There is mild atherosclerotic calcification of the aorta
without evidence of aneurysm. The pulmonary trunk is normal in
caliber. No pulmonary artery filling defect is identified.

Mediastinum/Nodes: A tracheostomy tube is noted. No mediastinal,
hilar, or axillary lymphadenopathy. The thyroid gland and esophagus
are within normal limits.

Lungs/Pleura: There is extensive debris in the right mainstem
bronchus and bilateral lower lobe bronchioles with consolidation in
the right lower lobe and scattered tree-in-bud nodular densities in
the lower lobes bilaterally. Small right pleural effusion. No
pneumothorax.

Upper Abdomen: No acute abnormality.

Musculoskeletal: Sternotomy wires are present over the midline.
Stable compression deformities are noted in the thoracic spine. No
acute osseous abnormality.

Review of the MIP images confirms the above findings.
IMPRESSION: 1. No evidence of pulmonary embolism.
2. Debris in the right mainstem bronchus and lower lobe bronchioles
bilaterally with patchy infiltrate at the left in the lower lobes
bilaterally and consolidation in the right lower lobe, concerning
for aspiration pneumonia.
3. Small right pleural effusion.
4. Aortic atherosclerosis with coronary artery calcifications.

## 2021-03-25 MED ORDER — IOHEXOL 350 MG/ML SOLN
60.0000 mL | Freq: Once | INTRAVENOUS | Status: AC | PRN
  Administered 2021-03-25: 60 mL via INTRAVENOUS

## 2021-03-25 MED ORDER — LACTATED RINGERS IV BOLUS
1000.0000 mL | Freq: Once | INTRAVENOUS | Status: AC
  Administered 2021-03-25: 1000 mL via INTRAVENOUS

## 2021-03-25 MED ORDER — SODIUM CHLORIDE 0.9% FLUSH: 10.0000 mL | INTRAVENOUS | Status: DC | PRN

## 2021-03-25 MED ORDER — SODIUM CHLORIDE 0.9 % IV SOLN
500.0000 mg | Freq: Once | INTRAVENOUS | Status: AC
  Administered 2021-03-25: 500 mg via INTRAVENOUS
  Filled 2021-03-25: qty 5

## 2021-03-25 MED ORDER — SODIUM CHLORIDE 0.9 % IV SOLN
1.0000 g | INTRAVENOUS | Status: DC
  Administered 2021-03-25: 1 g via INTRAVENOUS
  Filled 2021-03-25: qty 10

## 2021-03-25 NOTE — Consult Note (Signed)
NAME:  Austin Davis, MRN:  [id number], DOB:  07/07/1940, LOS: 0 ADMISSION DATE:  03/25/2021, CONSULTATION DATE:  03/25/2021 REFERRING MD:  Karmen Bongo, MD, CHIEF COMPLAINT: Respiratory distress  History of Present Illness:  81 year old man with chronic HFpEF, coronary artery disease, COPD and chronic hypoxic respiratory failure s/p trach, vent dependent was brought into the emergency department from [place] with respiratory distress and hypoxia.  History is limited, per EMS note they were told patient's O2 sat dropped to 80% despite increasing FiO2 on vent 100%, patient was suctioned by EMS and by the time he arrived to the ED his FiO2 was titrated down to 35% with PEEP of 5.  PCCM was consulted for help evaluation and management  Pertinent  Medical History   Past Medical History:  Diagnosis Date   CHF (congestive heart failure) ([place])    Coronary artery disease    GI bleed 2019   hospitalized at [facility] for one week   Headache    since childhood   Low blood sugar    since childhood, controlled by diet   Mitral regurgitation    Mitral valve prolapse    Osteopenia 2021   Paroxysmal atrial fibrillation ([place])      Significant Hospital Events: Including procedures, antibiotic start and stop dates in addition to other pertinent events     Interim History / Subjective:  Patient is feeling better, stated that he does have some secretion that needs suctioning  Objective   Blood pressure 96/68, pulse 80, temperature 98.7 F (37.1 C), temperature source Axillary, resp. rate 16, height 5\' 7"  (1.702 m), SpO2 100 %.    Vent Mode: PRVC FiO2 (%):  [35 %-100 %] 35 % Set Rate:  [16 bmp] 16 bmp Vt Set:  [450 mL] 450 mL PEEP:  [5 cmH20] 5 cmH20 Plateau Pressure:  [14 cmH20-18 cmH20] 18 cmH20   Intake/Output Summary (Last 24 hours) at 03/25/2021 1119 Last data filed at 03/25/2021 0716 Gross per 24 hour  Intake 1345.36 ml  Output --  Net 1345.36 ml   There were no vitals filed  for this visit.  Examination: Physical exam: General: Chronically ill-appearing male, lying on the bed, s/p trach on vent HEENT: [place]/AT, eyes anicteric.  moist mucus membranes Neuro: Alert, awake following commands Chest: Coarse breath sounds, no wheezes or rhonchi Heart: Regular rate and rhythm, no murmurs or gallops Abdomen: Soft, nontender, nondistended, bowel sounds present Skin: No rash   Resolved Hospital Problem list     Assessment & Plan:  Acute on chronic hypoxic/hypercapnic respiratory failure likely due to mucous plugging Aspiration pneumonia Parkinson's disease  Currently patient is on minimal ventilatory settings with FiO2 35% PEEP 5 He has returned back to baseline after suctioning Would recommend continuing suction every few hours at least first few days then to space it out CT chest did confirm right lower lobe pneumonia likely due to aspiration, would recommend continuing IV antibiotics with Unasyn for 5 more days Resume home meds  Patient is cleared from PCCM to be discharged back to [facility] (right click and "Reselect all SmartList Selections" daily)   Per primary team  Labs   CBC: Recent Labs  Lab 03/25/21 0243 03/25/21 0354  WBC 19.4*  --   NEUTROABS 18.1*  --   HGB 12.8* 13.9  HCT 40.7 41.0  MCV 89.5  --   PLT 497*  --     Basic Metabolic Panel: Recent Labs  Lab 03/25/21 0243 03/25/21 0354  NA 137 138  K 4.6 4.5  CL 94*  --   CO2 32  --   GLUCOSE 144*  --   BUN 32*  --   CREATININE 0.62  --   CALCIUM 9.6  --    GFR: CrCl cannot be calculated (Unknown ideal weight.). Recent Labs  Lab 03/25/21 0243 03/25/21 0618  WBC 19.4*  --   LATICACIDVEN  --  1.4    Liver Function Tests: Recent Labs  Lab 03/25/21 0243  AST 25  ALT 34  ALKPHOS 114  BILITOT 0.4  PROT 7.3  ALBUMIN 3.3*   No results for input(s): LIPASE, AMYLASE in the last 168 hours. No results for input(s): AMMONIA in the last 168 hours.  ABG     Component Value Date/Time   PHART 7.388 01/14/2021 1626   PCO2ART 49.9 (H) 01/14/2021 1626   PO2ART 108 01/14/2021 1626   HCO3 38.3 (H) 03/25/2021 0354   TCO2 40 (H) 03/25/2021 0354   O2SAT 99 03/25/2021 0354     Coagulation Profile: No results for input(s): INR, PROTIME in the last 168 hours.  Cardiac Enzymes: No results for input(s): CKTOTAL, CKMB, CKMBINDEX, TROPONINI in the last 168 hours.  HbA1C: Hgb A1c MFr Bld  Date/Time Value Ref Range Status  10/02/2020 04:46 AM 6.1 (H) 4.8 - 5.6 % Final    Comment:    (NOTE) Pre diabetes:          5.7%-6.4%  Diabetes:              >6.4%  Glycemic control for   <7.0% adults with diabetes   01/30/2015 11:15 AM 5.9 (H) <5.7 % Final    Comment:                                                                           According to the ADA Clinical Practice Recommendations for 2011, when HbA1c is used as a screening test:     >=6.5%   Diagnostic of Diabetes Mellitus            (if abnormal result is confirmed)   5.7-6.4%   Increased risk of developing Diabetes Mellitus   References:Diagnosis and Classification of Diabetes Mellitus,Diabetes LGXQ,1194,17(EYCXK 1):S62-S69 and Standards of Medical Care in         Diabetes - 2011,Diabetes Care,2011,34 (Suppl 1):S11-S61.       CBG: No results for input(s): GLUCAP in the last 168 hours.  Review of Systems:   Unable to obtain as patient is trached  Past Medical History:  He,  has a past medical history of CHF (congestive heart failure) ([place]), Coronary artery disease, GI bleed (2019), Headache, Low blood sugar, Mitral regurgitation, Mitral valve prolapse, Osteopenia (2021), and Paroxysmal atrial fibrillation ([place]).   Surgical History:   Past Surgical History:  Procedure Laterality Date   COLONOSCOPY WITH PROPOFOL N/A 02/19/2017   Procedure: COLONOSCOPY WITH PROPOFOL;  Surgeon: Wilford Corner, MD;  Location: [facility];  Service: Endoscopy;  Laterality: N/A;   IR  GASTROSTOMY TUBE MOD SED  10/18/2020   IR IVC FILTER PLMT / S&I /IMG GUID/MOD SED  11/01/2020   laser eye surgery for retina detachment     MITRAL VALVE REPAIR  01/2003  monitor  02/05/2006   polyp removal     TONSILLECTOMY     tooth removal     as a teenager   TRACHEOSTOMY TUBE PLACEMENT N/A 10/26/2020   Procedure: TRACHEOSTOMY;  Surgeon: Rozetta Nunnery, MD;  Location: [facility];  Service: ENT;  Laterality: N/A;     Social History:   reports that he has never smoked. He has never used smokeless tobacco. He reports current alcohol use. He reports that he does not use drugs.   Family History:  His family history includes Asthma in his daughter; CAD in his mother; Epilepsy in his son; Heart disease in his mother; Heart failure in his father, maternal grandmother, and paternal grandmother; Migraines in his mother; Pneumonia in his maternal grandfather; Skin cancer in his father; Stroke in his paternal grandfather; Tremor in his father; Valvular heart disease in his father. There is no history of Parkinson's disease.   Allergies Allergies  Allergen Reactions   Prednisone Other (See Comments)    Makes skin crawl, rapid HR   Cortisone Palpitations     Home Medications  Prior to Admission medications   Medication Sig Start Date End Date Taking? Authorizing Provider  acetaminophen (TYLENOL) 650 MG CR tablet 650 mg every 8 (eight) hours as needed for pain. Via tube   Yes [provider]  ALPRAZolam Duanne Moron) 0.5 MG tablet Take 0.5 mg by mouth every 8 (eight) hours as needed for anxiety.   Yes [provider]  Cholecalciferol (VITAMIN D3 SUPER STRENGTH) 50 MCG (2000 UT) TABS Give 2,000 Units by tube daily.   Yes [provider]  clonazePAM (KLONOPIN) 0.5 MG tablet Place 0.5 mg into feeding tube 2 (two) times daily.   Yes [provider]  escitalopram (LEXAPRO) 10 MG tablet Place 10 mg into feeding tube daily.   Yes [provider]  fexofenadine  (ALLEGRA) 180 MG tablet Place 180 mg into feeding tube daily.   Yes [provider]  folic acid (FOLVITE) 1 MG tablet Take 1 tablet (1 mg total) by mouth daily. Patient taking differently: Place 1 mg into feeding tube daily. 09/29/20  Yes [name], MD  furosemide (LASIX) 20 MG tablet Place 20 mg into feeding tube daily.   Yes [provider]  guaiFENesin (ROBITUSSIN) 100 MG/5ML liquid Place 20 mLs into feeding tube 3 (three) times daily.   Yes [provider]  hydrOXYzine (ATARAX) 25 MG tablet Place 25 mg into feeding tube every 6 (six) hours as needed for anxiety.   Yes [provider]  ipratropium-albuterol (DUONEB) 0.5-2.5 (3) MG/3ML SOLN Take 3 mLs by nebulization every 6 (six) hours as needed (shortness of breath).   Yes [provider]  Lactobacillus Rhamnosus, GG, (CULTURELLE) CAPS Give 10 Billion Cells by tube daily.   Yes [provider]  lactulose (CHRONULAC) 10 GM/15ML solution Place 20 g into feeding tube 2 (two) times daily as needed for mild constipation.   Yes [provider]  levalbuterol (XOPENEX) 1.25 MG/0.5ML nebulizer solution Take 1.25 mg by nebulization every 6 (six) hours.   Yes [provider]  levothyroxine (SYNTHROID) 25 MCG tablet Place 25 mcg into feeding tube daily before breakfast.   Yes [provider]  Menthol-Zinc Oxide (CALMOSEPTINE EX) Apply 1 application topically See admin instructions. Apply to red areas on sacral and perineal every shift   Yes [provider]  metoprolol tartrate (LOPRESSOR) 25 MG tablet Place 25 mg into feeding tube 2 (two) times daily.  Yes [provider]  midodrine (PROAMATINE) 5 MG tablet Take 1 tablet (5 mg total) by mouth 3 (three) times daily with meals. Patient taking differently: Place 5 mg into feeding tube 3 (three) times daily with meals. 09/28/20  Yes [name], MD  Multiple Vitamin (ONE-A-DAY MENS PO) Give 1  tablet by tube daily.   Yes [provider]  Nutritional Supplements (JEVITY 1.5 CAL PO) Give 50 mLs by tube See admin instructions. 50 ml / hr   Yes [provider]  Omega-3 Fatty Acids (FISH OIL PO) Give 1,000 mg by tube daily.   Yes [provider]  pantoprazole sodium (PROTONIX) 40 mg Place 40 mg into feeding tube daily.   Yes [provider]  phenylephrine (SUDAFED PE) 10 MG TABS tablet 10 mg daily as needed (congestion). Via tube   Yes [provider]  Pollen Extracts (PROSTAT PO) Give 30 mLs by tube in the morning and at bedtime.   Yes [provider]  polyethylene glycol powder (GLYCOLAX/MIRALAX) 17 GM/SCOOP powder Place 17 g into feeding tube every other day.   Yes [provider]  sodium chloride (OCEAN) 0.65 % SOLN nasal spray Place 1 spray into both nostrils every 4 (four) hours as needed for congestion.   Yes [provider]  Sodium Chloride Flush (NORMAL SALINE FLUSH IV) Inject 10 mLs into the vein as needed (before and after medications).   Yes [provider]  tamsulosin (FLOMAX) 0.4 MG CAPS capsule 0.4 mg daily. Via tube   Yes [provider]  vitamin C (ASCORBIC ACID) 500 MG tablet Place 500 mg into feeding tube daily.   Yes [provider]  zinc gluconate 50 MG tablet Take 50 mg by mouth daily.   Yes [provider]  diltiazem (CARDIZEM) 30 MG tablet Take 1 tablet (30 mg total) by mouth every 12 (twelve) hours. Patient not taking: Reported on 03/25/2021 09/28/20   [name], MD  feeding supplement (ENSURE ENLIVE / ENSURE PLUS) LIQD Take 237 mLs by mouth 2 (two) times daily between meals. Patient not taking: Reported on 03/25/2021 09/28/20   [name], MD  mirtazapine (REMERON) 7.5 MG tablet Take 1 tablet (7.5 mg total) by mouth at bedtime. Patient not taking: Reported on 03/25/2021 09/28/20   [name], MD  Multiple Vitamin (MULTIVITAMIN WITH MINERALS)  TABS tablet Take 1 tablet by mouth daily. Patient not taking: Reported on 03/25/2021 09/29/20   [name], MD  pantoprazole (PROTONIX) 40 MG tablet Take 1 tablet (40 mg total) by mouth 2 (two) times daily. Patient not taking: Reported on 03/25/2021 09/28/20   [name], MD  piperacillin-tazobactam (ZOSYN) IVPB Inject 3.375 g into the vein See admin instructions. Every 8 hours x 7 days Patient not taking: Reported on 03/25/2021    [provider]  senna-docusate (SENOKOT-S) 8.6-50 MG tablet Take 1 tablet by mouth 2 (two) times daily. Patient not taking: Reported on 03/25/2021 09/28/20   [name], MD  warfarin (COUMADIN) 1 MG tablet Take 1 tablet (1 mg total) by mouth daily. Note do not take warfarin 9/10 or 9/11 and call warfarin clinical for plan on 9/12 Patient not taking: Reported on 03/25/2021 09/29/20   [name], MD  warfarin (COUMADIN) 5 MG tablet Note, do not take warfarin 9/10 or 9/11 and call warfarin clinic for plan on 9/12. TAKE 1/2 TO 1 TABLET DAILY AS DIRECTED BY COUMADIN CLINIC. Patient not taking: Reported on  03/25/2021 09/29/20   [name], MD      Jacky Kindle MD [facility] See Amion for pager If no response to pager, please call [phone] until 7pm After 7pm, Please call [facility] [phone]

## 2021-03-25 NOTE — Assessment & Plan Note (Signed)
-  Patient was sent to the ER for hypoxia despite his ventilator ?-Evaluation including CTA showed aspiration PNA ?-TRH was consulted for admission ?-PCCM was consulted to assist with ventilator management ?-Based on stability of the patient including ventilator settings, he appears to be appropriate to return to [facility] today for ongoing treatment of aspiration PNA with antibiotics - duration of treatment can be deferred to them ?

## 2021-03-25 NOTE — ED Notes (Signed)
Dr Lorin Mercy rounded on pt. RN asked if foley needs to be changed out for ordered urine specimen, Dr Lorin Mercy to let RN know if this is the case. Inside foley tubing has large amt sediment and debris so not a clean specimen, unable to send to lab off current foley. ?

## 2021-03-25 NOTE — Progress Notes (Signed)
Patient transported to CT scan and back to ED34 with out incident. ?

## 2021-03-25 NOTE — ED Notes (Addendum)
RT at bedside suctioning patient. Pt reporting choking feeling. Pt reporting recent antibiotic use with diarrhea.  ?

## 2021-03-25 NOTE — ED Triage Notes (Signed)
Pt arrived via [facility] from [facility] for cc of respiratory distress. Pt is vented, per [facility] staff, pt desatted to 80% on normal vent settings (PEEP 5, FiO2 35%), staff increased FiO2 to 100%, pt then desatted again and staff increased PEEP to 10 and initiated transport to [facility] ED.  [facility] reduced PEEP to 5 and transported patient with a stable O2 saturation in high 90%. Pt noted to be tachycardic in route. [facility] report GCS 11 - pt able to track, nod appropriately to questions. Existing lines/tubes/drains: 6 portex cuffed trach, 65F Foley, Peg tube present, 18g midline. ? ?BP 121/78 ?HR 119 ?RR 22 ?[id number] FioO2 100% ? ? ?

## 2021-03-25 NOTE — ED Notes (Signed)
[facility] called at 11:27 ([facility] no-go); ETA "in a little while" per [place]. ?

## 2021-03-25 NOTE — Assessment & Plan Note (Signed)
-  His daughter reports progressive tremor and increased communication difficulties ?-Aspiration may be associated with progressive swallow dysfunction ?-This will need ongoing discussion at [facility], as well as goals of care discussion ?

## 2021-03-25 NOTE — ED Provider Notes (Signed)
[place] EMERGENCY DEPARTMENT Provider Note  CSN: [id number] Arrival date & time: 03/25/21 0243  Chief Complaint(s) Respiratory Distress  HPI Austin CRADLE is a 81 y.o. 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  History limited, but EMS stating that patient was hypoxic into the mid 80s prompting the EMS call.  On arrival, patient maintaining oxygen saturations on home settings at 99%.  The patient is able to answer questions and states that he is having some chest pain and shortness of breath.  Patient arrives tachycardic but otherwise is hemodynamically stable.  HPI  Past Medical History Past Medical History:  Diagnosis Date   CHF (congestive heart failure) ([place])    Coronary artery disease    GI bleed 2019   hospitalized at [facility] for one week   Headache    since childhood   Low blood sugar    since childhood, controlled by diet   Mitral regurgitation    Mitral valve prolapse    Osteopenia 2021   Paroxysmal atrial fibrillation [facility])    Patient Active Problem List   Diagnosis Date Noted   Obstructive sleep apnea 10/24/2020   COPD, severe ([place]) 10/24/2020   Chronic heart failure with preserved ejection fraction ([place]) 10/24/2020   Unintentional weight loss 33/82/5053   Acute metabolic encephalopathy 97/67/3419   SVT (supraventricular tachycardia) () 09/20/2020   Protein-calorie malnutrition, severe 09/17/2020   Acute on chronic respiratory failure with hypoxia and hypercapnia ([place]) 09/16/2020   Failure to thrive in adult 09/16/2020   AF (paroxysmal atrial fibrillation) ([place]) 09/16/2020   Stage 1 skin ulcer of sacral region ([place]) 09/16/2020   Hypotension 06/30/2020   GIB (gastrointestinal bleeding) 02/17/2017   Rectal bleeding 02/16/2017   Varicose veins of both lower extremities  10/12/2016   Essential tremor 02/19/2015   Status post mitral valve annuloplasty and MAZE 2005 12/12/2014   HLD (hyperlipidemia) 05/05/2013   DOE (dyspnea on exertion) 04/22/2013   Fatigue 04/22/2013   Atrial fibrillation ([place]) 05/08/2012   Home Medication(s) Prior to Admission medications   Medication Sig Start Date End Date Taking? Authorizing Provider  acetaminophen (TYLENOL) 500 MG tablet Take 500 mg by mouth every 6 (six) hours as needed for mild pain or headache.    [provider]  ALPRAZolam Duanne Moron) 0.25 MG tablet Take 0.125 mg by mouth at bedtime as needed for anxiety.    [provider]  Ascorbic Acid (VITAMIN C IMMUNE HEALTH PO) Take 500 mg by mouth.    [provider]  B Complex Vitamins (VITAMIN B COMPLEX PO) Take 1 tablet by mouth daily.    [provider]  Cholecalciferol (D3) 50 MCG (2000 UT) TABS Take 2,000 Units by mouth daily.    [provider]  diltiazem (CARDIZEM) 30 MG tablet Take 1 tablet (30 mg total) by mouth every 12 (twelve) hours. 09/28/20   [name], MD  feeding supplement (ENSURE ENLIVE / ENSURE PLUS) LIQD Take 237 mLs by mouth 2 (two) times daily between meals. 09/28/20   [name], MD  folic acid (FOLVITE) 1 MG tablet Take 1 tablet (1 mg total) by mouth daily. 09/29/20   [name], MD  Magnesium 250 MG TABS Take 250 mg by mouth daily.    [provider]  midodrine (PROAMATINE) 5 MG tablet Take 1 tablet (5 mg total) by  mouth 3 (three) times daily with meals. 09/28/20   [name], MD  mirtazapine (REMERON) 7.5 MG tablet Take 1 tablet (7.5 mg total) by mouth at bedtime. 09/28/20   [name], MD  Multiple Vitamin (MULTIVITAMIN WITH MINERALS) TABS tablet Take 1 tablet by mouth daily. 09/29/20   [name], MD  Omega-3 Fatty Acids (FISH OIL PO) Take 1 capsule by mouth daily.    [provider]  pantoprazole (PROTONIX) 40 MG tablet Take 1 tablet (40  mg total) by mouth 2 (two) times daily. 09/28/20   [name], MD  senna-docusate (SENOKOT-S) 8.6-50 MG tablet Take 1 tablet by mouth 2 (two) times daily. 09/28/20   [name], MD  warfarin (COUMADIN) 1 MG tablet Take 1 tablet (1 mg total) by mouth daily. Note do not take warfarin 9/10 or 9/11 and call warfarin clinical for plan on 9/12 09/29/20   [name], MD  warfarin (COUMADIN) 5 MG tablet Note, do not take warfarin 9/10 or 9/11 and call warfarin clinic for plan on 9/12. TAKE 1/2 TO 1 TABLET DAILY AS DIRECTED BY COUMADIN CLINIC. 09/29/20   [name], MD                                                                                                                                    Past Surgical History Past Surgical History:  Procedure Laterality Date   COLONOSCOPY WITH PROPOFOL N/A 02/19/2017   Procedure: COLONOSCOPY WITH PROPOFOL;  Surgeon: Wilford Corner, MD;  Location: [facility];  Service: Endoscopy;  Laterality: N/A;   IR GASTROSTOMY TUBE MOD SED  10/18/2020   IR IVC FILTER PLMT / S&I /IMG GUID/MOD SED  11/01/2020   laser eye surgery for retina detachment     MITRAL VALVE REPAIR  01/2003   monitor  02/05/2006   polyp removal     TONSILLECTOMY     tooth removal     as a teenager   TRACHEOSTOMY TUBE PLACEMENT N/A 10/26/2020   Procedure: TRACHEOSTOMY;  Surgeon: Rozetta Nunnery, MD;  Location: [facility];  Service: ENT;  Laterality: N/A;   Family History Family History  Problem Relation Age of Onset   Heart disease Mother    CAD Mother    Migraines Mother    Heart failure Father    Skin cancer Father    Valvular heart disease Father        prolapsed valve   Tremor Father        "probably had a bit of tremor in his old age"   Heart failure Maternal Grandmother    Pneumonia Maternal Grandfather    Heart failure Paternal Grandmother    Stroke Paternal Grandfather    Asthma Daughter    Epilepsy Son    Parkinson's disease Neg Hx      Social History Social History   Tobacco Use   Smoking status:  Never   Smokeless tobacco: Never   Tobacco comments:    only tried a few cigarettes when he was younger  Substance Use Topics   Alcohol use: Yes    Comment: "probably a 6 pack of beer a year"   Drug use: No   Allergies Prednisone and Cortisone  Review of Systems Review of Systems  Respiratory:  Positive for shortness of breath.   Cardiovascular:  Positive for chest pain.   Physical Exam Vital Signs  I have reviewed the triage vital signs BP 119/89 (BP Location: Right Arm)    Pulse (!) 117    Temp 98.7 F (37.1 C) (Axillary)    Resp (!) 23    SpO2 92%   Physical Exam Vitals and nursing note reviewed.  Constitutional:      General: He is not in acute distress.    Appearance: He is well-developed. He is ill-appearing.  HENT:     Head: Normocephalic and atraumatic.  Eyes:     Conjunctiva/sclera: Conjunctivae normal.  Cardiovascular:     Rate and Rhythm: Regular rhythm. Tachycardia present.     Heart sounds: No murmur heard. Pulmonary:     Effort: Pulmonary effort is normal. No respiratory distress.     Breath sounds: Rales (Right lower lobe) present.  Abdominal:     Palpations: Abdomen is soft.     Tenderness: There is no abdominal tenderness.  Musculoskeletal:        General: No swelling.     Cervical back: Neck supple.  Skin:    General: Skin is warm and dry.     Capillary Refill: Capillary refill takes less than 2 seconds.  Neurological:     Mental Status: He is alert.  Psychiatric:        Mood and Affect: Mood normal.    ED Results and Treatments Labs (all labs ordered are listed, but only abnormal results are displayed) Labs Reviewed  CBC WITH DIFFERENTIAL/PLATELET - Abnormal; Notable for the following components:      Result Value   WBC 19.4 (*)    Hemoglobin 12.8 (*)    RDW 16.7 (*)    Platelets 497 (*)    Neutro Abs 18.1 (*)    Lymphs Abs 0.5 (*)    All other components within  normal limits  COMPREHENSIVE METABOLIC PANEL  TROPONIN I (HIGH SENSITIVITY)                                                                                                                          Radiology No results found.  Pertinent labs & imaging results that were available during my care of the patient were reviewed by me and considered in my medical decision making (see MDM for details).  Medications Ordered in ED Medications - No data to display  Procedures .Critical Care Performed by: Teressa Lower, MD Authorized by: Teressa Lower, MD   Critical care provider statement:    Critical care time (minutes):  30   Critical care was necessary to treat or prevent imminent or life-threatening deterioration of the following conditions:  Sepsis   Critical care was time spent personally by me on the following activities:  Development of treatment plan with patient or surrogate, discussions with consultants, evaluation of patient's response to treatment, examination of patient, ordering and review of laboratory studies, ordering and review of radiographic studies, ordering and performing treatments and interventions, pulse oximetry, re-evaluation of patient's condition and review of old charts  (including critical care time)  Medical Decision Making / ED Course   This patient presents to the ED for concern of hypoxia, this involves an extensive number of treatment options, and is a complaint that carries with it a high risk of complications and morbidity.  The differential diagnosis includes aspiration, mucous plugging, pneumonia, PE  MDM: Patient seen emergency department for evaluation of hypoxia.  Physical exam with right lower lobe rales but is otherwise unremarkable.  Laboratory evaluation with a significant leukocytosis to 19.4, hemoglobin 12.8,  platelets elevated to 497.  pH 7.4 with PCO2 of 55.4.  Chest x-ray with mild airspace disease in the right lower lobe.  In the setting of unexplained hypoxia, CT PE obtained that was negative for PE but does show evidence of aspiration pneumonia.  On reevaluation, patient's blood pressures have become gradually softer despite appropriate fluid resuscitation.  Ceftriaxone azithromycin started and fluid resuscitation continued.  Patient did have an episode of desaturation into the upper 80s while in the emergency department, and patient will require admission for aspiration pneumonia and possible sepsis.   Additional history obtained:  -External records from outside source obtained and reviewed including: Chart review including previous notes, labs, imaging, consultation notes   Lab Tests: -I ordered, reviewed, and interpreted labs.   The pertinent results include:   Labs Reviewed  CBC WITH DIFFERENTIAL/PLATELET - Abnormal; Notable for the following components:      Result Value   WBC 19.4 (*)    Hemoglobin 12.8 (*)    RDW 16.7 (*)    Platelets 497 (*)    Neutro Abs 18.1 (*)    Lymphs Abs 0.5 (*)    All other components within normal limits  COMPREHENSIVE METABOLIC PANEL  TROPONIN I (HIGH SENSITIVITY)      EKG   EKG Interpretation  Date/Time:  Monday March 25 2021 02:50:57 EST Ventricular Rate:  115 PR Interval:  159 QRS Duration: 99 QT Interval:  332 QTC Calculation: 460 R Axis:   75 Text Interpretation: Sinus tachycardia Low voltage with right axis deviation Confirmed by [facility] ([id number]) on 03/25/2021 2:57:34 AM         Imaging Studies ordered: I ordered imaging studies including CXR, CTPE I independently visualized and interpreted imaging. I agree with the radiologist interpretation   Medicines ordered and prescription drug management: No orders of the defined types were placed in this encounter.   -I have reviewed the patients home medicines and have made  adjustments as needed  Critical interventions Multiple antibiotics, fluid resuscitation  Cardiac Monitoring: The patient was maintained on a cardiac monitor.  I personally viewed and interpreted the cardiac monitored which showed an underlying rhythm of: NSR  Social Determinants of Health:  Factors impacting patients care include: LTACH patient    Reevaluation: After the interventions noted above, I reevaluated the  patient and found that they have :improved  Co morbidities that complicate the patient evaluation  Past Medical History:  Diagnosis Date   CHF (congestive heart failure) ([place])    Coronary artery disease    GI bleed 2019   hospitalized at [facility] for one week   Headache    since childhood   Low blood sugar    since childhood, controlled by diet   Mitral regurgitation    Mitral valve prolapse    Osteopenia 2021   Paroxysmal atrial fibrillation ([facility])       Dispostion: I considered admission for this patient, and due to soft blood pressures, concern for sepsis secondary to aspiration pneumonia patient was admitted.     Final Clinical Impression(s) / ED Diagnoses Final diagnoses:  Dyspnea     @PCDICTATION @    Teressa Lower, MD 03/25/21 [phone]

## 2021-03-25 NOTE — ED Notes (Signed)
RN rounded on pt at shift change, noted to have extremely soiled brief with large amt soft brown stool. RN and NT changed pt, provided peri care. Pt resting in stretcher with call light in reach. ?

## 2021-03-25 NOTE — Consult Note (Signed)
ER Course   Patient: CUTTER PASSEY ZDG:[id number] DOB: [birth year] DOA: 03/25/2021 DOS: the patient was seen and examined on 03/25/2021 PCP: Rosaria Ferries, MD  Patient coming from: [facility]; NOK: Daughter, Yecheskel Kurek, [phone]   Chief Complaint: SOB  HPI: ANGELINA NEECE is a 81 y.o. 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   The patient has a trach/PEG tube.    I spoke with his daughter to remove his belongings - she is having to relinquish his room while he is admitted.  He started complaining May/June that he didn't feel well.  His girlfriend reported that he passed out in June.  He was taken to [facility] and he was observed and discharged the next day.  He "doesn't always have contact with his children."  She saw him again in July and he had lost a lot of weight.  By late July, he was brought again to the ER at [facility]; he told his daughter not to come.  He was admitted to ICU for a week or two.  Dr. Hilma Favors "was one of his admitting physicians."  He complained of a metallic taste, wasn't eating.  He went from CPAP for OSA to BIPAP.  He was very lethargic and confused.  There was some concern for vascular dementia.  He was started on a steroid and he started getting better, transferred to SDU.  He went to [facility].  Within a week, he had a PEG placed.  She was called shortly thereafter and he was intubated (she is not sure why).  He ended up with a trach while at [facility].  By late September, "they could no longer take care of him" and he was transferred to [facility].  [facility] diagnosed "double pneumonia".  He has stayed on the ventilator.  By Christmas, they thought the pneumonia was gone but on 12/26 they called her and reported hypoxia and he was stabilized but they were concerned  and he was sent to the ER for further testing.   He "kicked me out of the ER" and he was later returned back to [facility] with treatment for PNA.  From 12/27 to February he was treated for PNA and they have been trying to wean off the ventilator since.  They thought the PNA was back last week and they were treating for PNA with antibiotics.  They called her at 1225AM today and reported hypoxia, bagging, stabilized but then it happened again and they transported him to the ER.    There are a number of family dynamics that have contributed to lack of paperwork about ACP.  Previous documentation noted DNR and his daughter was to be HCPOA, but then the patient apparently refused to sign the papers.  The facility has noticed worsening facial tremors and increasing communication difficulties.      ER Course:  Carryover, per Dr. Sidney Ace:   81 y.o. male with PMH CHF, CAD, GI bleed, paroxysmal A-fib, severe COPD currently trach dependent with PEG tube in place, coming with aspiration pneumonia and hypoxia with increased O2 requirement as well as sepsis.  Add IV Rocephin and azithromycin with 2 L bolus of LR.     Review of Systems: unable to review all systems due to the inability of the patient to answer questions. Past Medical History:  Diagnosis Date   CHF (congestive  heart failure) ([place])    Coronary artery disease    GI bleed 2019   hospitalized at [facility] for one week   Headache    since childhood   Low blood sugar    since childhood, controlled by diet   Mitral regurgitation    Mitral valve prolapse    Osteopenia 2021   Paroxysmal atrial fibrillation [facility])    Past Surgical History:  Procedure Laterality Date   COLONOSCOPY WITH PROPOFOL N/A 02/19/2017   Procedure: COLONOSCOPY WITH PROPOFOL;  Surgeon: Wilford Corner, MD;  Location: [facility] ENDOSCOPY;  Service: Endoscopy;  Laterality: N/A;   IR GASTROSTOMY TUBE MOD SED  10/18/2020   IR IVC FILTER PLMT / S&I /IMG GUID/MOD SED  11/01/2020    laser eye surgery for retina detachment     MITRAL VALVE REPAIR  01/2003   monitor  02/05/2006   polyp removal     TONSILLECTOMY     tooth removal     as a teenager   TRACHEOSTOMY TUBE PLACEMENT N/A 10/26/2020   Procedure: TRACHEOSTOMY;  Surgeon: Rozetta Nunnery, MD;  Location: [place];  Service: ENT;  Laterality: N/A;   Social History:  reports that he has never smoked. He has never used smokeless tobacco. He reports current alcohol use. He reports that he does not use drugs.  Allergies  Allergen Reactions   Prednisone Other (See Comments)    Makes skin crawl, rapid HR   Cortisone Palpitations    Family History  Problem Relation Age of Onset   Heart disease Mother    CAD Mother    Migraines Mother    Heart failure Father    Skin cancer Father    Valvular heart disease Father        prolapsed valve   Tremor Father        "probably had a bit of tremor in his old age"   Heart failure Maternal Grandmother    Pneumonia Maternal Grandfather    Heart failure Paternal Grandmother    Stroke Paternal Grandfather    Asthma Daughter    Epilepsy Son    Parkinson's disease Neg Hx     Prior to Admission medications   Medication Sig Start Date End Date Taking? Authorizing Provider  acetaminophen (TYLENOL) 650 MG CR tablet 650 mg every 8 (eight) hours as needed for pain. Via tube   Yes [provider]  ALPRAZolam Duanne Moron) 0.5 MG tablet Take 0.5 mg by mouth every 8 (eight) hours as needed for anxiety.   Yes [provider]  Cholecalciferol (VITAMIN D3 SUPER STRENGTH) 50 MCG (2000 UT) TABS Give 2,000 Units by tube daily.   Yes [provider]  clonazePAM (KLONOPIN) 0.5 MG tablet Place 0.5 mg into feeding tube 2 (two) times daily.   Yes [provider]  escitalopram (LEXAPRO) 10 MG tablet Place 10 mg into feeding tube daily.   Yes [provider]  fexofenadine (ALLEGRA) 180 MG tablet Place 180 mg into feeding tube daily.   Yes [provider]  folic acid (FOLVITE) 1 MG tablet Take 1 tablet (1 mg total) by mouth daily. Patient taking differently: Place 1 mg into feeding tube daily. 09/29/20  Yes [name], MD  furosemide (LASIX) 20 MG tablet Place 20 mg into feeding tube daily.   Yes [provider]  guaiFENesin (ROBITUSSIN) 100 MG/5ML liquid Place 20 mLs into feeding tube 3 (three) times daily.   Yes [provider]  hydrOXYzine (ATARAX) 25 MG  tablet Place 25 mg into feeding tube every 6 (six) hours as needed for anxiety.   Yes [provider]  ipratropium-albuterol (DUONEB) 0.5-2.5 (3) MG/3ML SOLN Take 3 mLs by nebulization every 6 (six) hours as needed (shortness of breath).   Yes [provider]  Lactobacillus Rhamnosus, GG, (CULTURELLE) CAPS Give 10 Billion Cells by tube daily.   Yes [provider]  lactulose (CHRONULAC) 10 GM/15ML solution Place 20 g into feeding tube 2 (two) times daily as needed for mild constipation.   Yes [provider]  levalbuterol (XOPENEX) 1.25 MG/0.5ML nebulizer solution Take 1.25 mg by nebulization every 6 (six) hours.   Yes [provider]  levothyroxine (SYNTHROID) 25 MCG tablet Place 25 mcg into feeding tube daily before breakfast.   Yes [provider]  Menthol-Zinc Oxide (CALMOSEPTINE EX) Apply 1 application topically See admin instructions. Apply to red areas on sacral and perineal every shift   Yes [provider]  metoprolol tartrate (LOPRESSOR) 25 MG tablet Place 25 mg into feeding tube 2 (two) times daily.   Yes [provider]  midodrine (PROAMATINE) 5 MG tablet Take 1 tablet (5 mg total) by mouth 3 (three) times daily with meals. Patient taking differently: Place 5 mg into feeding tube 3 (three) times daily with meals. 09/28/20  Yes [name], MD  Multiple Vitamin (ONE-A-DAY MENS PO) Give 1 tablet by tube daily.   Yes [provider]  Nutritional Supplements  (JEVITY 1.5 CAL PO) Give 50 mLs by tube See admin instructions. 50 ml / hr   Yes [provider]  Omega-3 Fatty Acids (FISH OIL PO) Give 1,000 mg by tube daily.   Yes [provider]  pantoprazole sodium (PROTONIX) 40 mg Place 40 mg into feeding tube daily.   Yes [provider]  phenylephrine (SUDAFED PE) 10 MG TABS tablet 10 mg daily as needed (congestion). Via tube   Yes [provider]  Pollen Extracts (PROSTAT PO) Give 30 mLs by tube in the morning and at bedtime.   Yes [provider]  polyethylene glycol powder (GLYCOLAX/MIRALAX) 17 GM/SCOOP powder Place 17 g into feeding tube every other day.   Yes [provider]  sodium chloride (OCEAN) 0.65 % SOLN nasal spray Place 1 spray into both nostrils every 4 (four) hours as needed for congestion.   Yes [provider]  Sodium Chloride Flush (NORMAL SALINE FLUSH IV) Inject 10 mLs into the vein as needed (before and after medications).   Yes [provider]  tamsulosin (FLOMAX) 0.4 MG CAPS capsule 0.4 mg daily. Via tube   Yes [provider]  vitamin C (ASCORBIC ACID) 500 MG tablet Place 500 mg into feeding tube daily.   Yes [provider]  zinc gluconate 50 MG tablet Take 50 mg by mouth daily.   Yes [provider]  diltiazem (CARDIZEM) 30 MG tablet Take 1 tablet (30 mg total) by mouth every 12 (twelve) hours. Patient not taking: Reported on 03/25/2021 09/28/20   [name], MD  feeding supplement (ENSURE ENLIVE / ENSURE PLUS) LIQD Take 237 mLs by mouth 2 (two) times daily between meals. Patient not taking: Reported on 03/25/2021 09/28/20   [name], MD  mirtazapine (REMERON) 7.5 MG tablet Take 1 tablet (7.5 mg total) by mouth at bedtime. Patient not taking: Reported on 03/25/2021 09/28/20   [name], MD  Multiple Vitamin (MULTIVITAMIN WITH MINERALS) TABS tablet Take 1 tablet by mouth daily. Patient not  taking: Reported on  03/25/2021 09/29/20   [name], MD  pantoprazole (PROTONIX) 40 MG tablet Take 1 tablet (40 mg total) by mouth 2 (two) times daily. Patient not taking: Reported on 03/25/2021 09/28/20   [name], MD  piperacillin-tazobactam (ZOSYN) IVPB Inject 3.375 g into the vein See admin instructions. Every 8 hours x 7 days Patient not taking: Reported on 03/25/2021    [provider]  senna-docusate (SENOKOT-S) 8.6-50 MG tablet Take 1 tablet by mouth 2 (two) times daily. Patient not taking: Reported on 03/25/2021 09/28/20   [name], MD  warfarin (COUMADIN) 1 MG tablet Take 1 tablet (1 mg total) by mouth daily. Note do not take warfarin 9/10 or 9/11 and call warfarin clinical for plan on 9/12 Patient not taking: Reported on 03/25/2021 09/29/20   [name], MD  warfarin (COUMADIN) 5 MG tablet Note, do not take warfarin 9/10 or 9/11 and call warfarin clinic for plan on 9/12. TAKE 1/2 TO 1 TABLET DAILY AS DIRECTED BY COUMADIN CLINIC. Patient not taking: Reported on 03/25/2021 09/29/20   [name], MD    Physical Exam: Vitals:   03/25/21 0745 03/25/21 0749 03/25/21 0915 03/25/21 1008  BP: 99/65  94/80 106/72  Pulse: 83 84 83 88  Resp: 17 19 (!) 22 20  Temp:      TempSrc:      SpO2: 100% 100% 100% 100%  Height:       General:  Appears calm and comfortable when resting; upon awakening, the patient attempts to communicate with lip movements but these are impossible to interpret given his facial tremors, and he does not appear to be able to write for communication (this was offered) Eyes:  PERRL, EOMI, normal lids, iris ENT:  grossly normal lips & tongue Neck:  trach in place, on vent Cardiovascular:  RRR, no m/r/g. No LE edema.  Respiratory:   Scattered rhonchi.  Normal respiratory effort on ventilator. Abdomen:  soft, NT, ND, +PEG tube Skin:  no rash or induration seen on limited exam Musculoskeletal:  grossly normal tone BUE/BLE, good ROM, no bony  abnormality Psychiatric:  frantic attempts to "speak" with non-audible speech Neurologic:  facial tremor noted with speech, unable to perform otherwise   Radiological Exams on Admission: Independently reviewed - see discussion in A/P where applicable  CT Angio Chest PE W and/or Wo Contrast  Result Date: 03/25/2021 CLINICAL DATA:  Pulmonary embolism suspected, high probability. EXAM: CT ANGIOGRAPHY CHEST WITH CONTRAST TECHNIQUE: Multidetector CT imaging of the chest was performed using the standard protocol during bolus administration of intravenous contrast. Multiplanar CT image reconstructions and MIPs were obtained to evaluate the vascular anatomy. RADIATION DOSE REDUCTION: This exam was performed according to the departmental dose-optimization program which includes automated exposure control, adjustment of the mA and/or kV according to patient size and/or use of iterative reconstruction technique. CONTRAST:  58mL OMNIPAQUE IOHEXOL 350 MG/ML SOLN COMPARISON:  10/25/2020. FINDINGS: Cardiovascular: The heart is normal in size and there is no pericardial effusion. Scattered coronary artery calcifications are noted. There is mild atherosclerotic calcification of the aorta without evidence of aneurysm. The pulmonary trunk is normal in caliber. No pulmonary artery filling defect is identified. Mediastinum/Nodes: A tracheostomy tube is noted. No mediastinal, hilar, or axillary lymphadenopathy. The thyroid gland and esophagus are within normal limits. Lungs/Pleura: There is extensive debris in the right mainstem bronchus and bilateral lower lobe bronchioles with consolidation in the right lower lobe and scattered tree-in-bud nodular densities in  the lower lobes bilaterally. Small right pleural effusion. No pneumothorax. Upper Abdomen: No acute abnormality. Musculoskeletal: Sternotomy wires are present over the midline. Stable compression deformities are noted in the thoracic spine. No acute osseous abnormality.  Review of the MIP images confirms the above findings. IMPRESSION: 1. No evidence of pulmonary embolism. 2. Debris in the right mainstem bronchus and lower lobe bronchioles bilaterally with patchy infiltrate at the left in the lower lobes bilaterally and consolidation in the right lower lobe, concerning for aspiration pneumonia. 3. Small right pleural effusion. 4. Aortic atherosclerosis with coronary artery calcifications. Electronically Signed   By: Brett Fairy M.D.   On: 03/25/2021 05:09   DG Chest Port 1 View  Result Date: 03/25/2021 CLINICAL DATA:  Respiratory distress, possible aspiration. EXAM: PORTABLE CHEST 1 VIEW COMPARISON:  None. FINDINGS: The heart size and mediastinal contours are stable. Atherosclerotic calcification of the aorta is noted. The tracheostomy tube terminates 6.7 cm above the carina. Patchy airspace disease is present in the medial aspect of the right lower lobe with improved aeration from the prior exam. The left lung is clear. No effusion or pneumothorax. Sternotomy wires are noted over the midline. IMPRESSION: Mild residual airspace disease in the medial aspect of the right lung base with improved aeration from the prior exam. Electronically Signed   By: Brett Fairy M.D.   On: 03/25/2021 03:31    EKG: Independently reviewed.  Sinus tachycardia with rate 115; no evidence of acute ischemia   Labs on Admission: I have personally reviewed the available labs and imaging studies at the time of the admission.  Pertinent labs:    ABG: 7.488/55.4/151/38.3 Glucose 144 WBC 19.4 Hgb 12.8    Assessment and Plan: * Aspiration pneumonia ([place]) -Patient was sent to the ER for hypoxia despite his ventilator -Evaluation including CTA showed aspiration PNA -TRH was consulted for admission -PCCM was consulted to assist with ventilator management -Based on stability of the patient including ventilator settings, he appears to be appropriate to return to [facility] today for ongoing  treatment of aspiration PNA with antibiotics - duration of treatment can be deferred to them  Parkinsonism [facility]) -His daughter reports progressive tremor and increased communication difficulties -Aspiration may be associated with progressive swallow dysfunction -This will need ongoing discussion at [facility], as well as goals of care discussion    Based on the overall clinical stability of the patient at this time, in conjunction with the ability of the facility to provide for his current needs, the patient is appropriate for dc back to [facility] at this time.  The EDP, Dr. Alvino Chapel, has been made aware.  Thank you for this interesting consult.   Author: Karmen Bongo, MD 03/25/2021 10:38 AM  For on call review [email or web address].

## 2021-03-25 NOTE — ED Provider Notes (Signed)
?  Physical Exam  ?BP 98/80 (BP Location: Right Arm)   Pulse 79   Temp 98.4 ?F (36.9 ?C) (Axillary)   Resp (!) 21   Ht 5\' 7"  (1.702 m)   SpO2 100%   BMI 16.88 kg/m?  ? ?Physical Exam ? ?Procedures  ?Procedures ? ?ED Course / MDM  ?  ?Medical Decision Making ?Amount and/or Complexity of Data Reviewed ?Labs: ordered. ?Radiology: ordered. ? ?Risk ?Prescription drug management. ? ? ?Discussed with Dr. Lorin Mercy from hospitalist service.  Does not think patient requires admission.  She has discussed with pulmonary service.  Patient is already at [facility] which can manage there.  She recommended Unasyn or Augmentin but that can be arranged by the doctor at [facility].  Mild hypotension but appears somewhat chronic.  Will discharge. ? ? ? ? ?  ?Davonna Belling, MD ?03/25/21 1156 ? ?

## 2021-03-25 NOTE — Discharge Instructions (Addendum)
Patient can go back to [facility].  Will require antibiotics but is no longer hypoxic.  Potentially Augmentin or Unasyn if IV antibiotics are used.  The providers there can help with the antibiotics. ?

## 2021-03-25 NOTE — ED Notes (Signed)
Pt d/c'd back to [facility] on vent with [facility]. Crystal RN took report at facility. ?

## 2021-03-25 NOTE — Progress Notes (Signed)
Patient arrived via EMS from [facility]. Placed on previous hospital settings [id number]. Fio2 initially able to be weaned to 35%. Due to desaturation increased to 65%. ?

## 2021-03-26 ENCOUNTER — Encounter (HOSPITAL_COMMUNITY): Payer: Self-pay

## 2021-03-26 ENCOUNTER — Other Ambulatory Visit: Payer: Self-pay

## 2021-03-26 ENCOUNTER — Telehealth (HOSPITAL_BASED_OUTPATIENT_CLINIC_OR_DEPARTMENT_OTHER): Payer: Self-pay | Admitting: Emergency Medicine

## 2021-03-26 ENCOUNTER — Emergency Department (HOSPITAL_COMMUNITY): Payer: Medicare Other

## 2021-03-26 ENCOUNTER — Emergency Department (HOSPITAL_COMMUNITY)
Admission: EM | Admit: 2021-03-26 | Discharge: 2021-03-26 | Disposition: A | Discharge status: Skilled Nursing Facility | Payer: Medicare Other | Attending: Emergency Medicine | Admitting: Emergency Medicine

## 2021-03-26 DIAGNOSIS — I2510 Atherosclerotic heart disease of native coronary artery without angina pectoris: Secondary | ICD-10-CM | POA: Diagnosis not present

## 2021-03-26 DIAGNOSIS — N390 Urinary tract infection, site not specified: Secondary | ICD-10-CM | POA: Insufficient documentation

## 2021-03-26 DIAGNOSIS — T83511A Infection and inflammatory reaction due to indwelling urethral catheter, initial encounter: Secondary | ICD-10-CM | POA: Insufficient documentation

## 2021-03-26 DIAGNOSIS — J9690 Respiratory failure, unspecified, unspecified whether with hypoxia or hypercapnia: Secondary | ICD-10-CM | POA: Diagnosis not present

## 2021-03-26 DIAGNOSIS — I509 Heart failure, unspecified: Secondary | ICD-10-CM | POA: Insufficient documentation

## 2021-03-26 DIAGNOSIS — G20 Parkinson's disease: Secondary | ICD-10-CM | POA: Insufficient documentation

## 2021-03-26 DIAGNOSIS — J690 Pneumonitis due to inhalation of food and vomit: Secondary | ICD-10-CM | POA: Insufficient documentation

## 2021-03-26 DIAGNOSIS — Z20822 Contact with and (suspected) exposure to covid-19: Secondary | ICD-10-CM | POA: Insufficient documentation

## 2021-03-26 DIAGNOSIS — R918 Other nonspecific abnormal finding of lung field: Secondary | ICD-10-CM | POA: Diagnosis not present

## 2021-03-26 DIAGNOSIS — I4891 Unspecified atrial fibrillation: Secondary | ICD-10-CM | POA: Diagnosis not present

## 2021-03-26 LAB — CBC WITH DIFFERENTIAL/PLATELET
Abs Immature Granulocytes: 0.05 10*3/uL (ref 0.00–0.07)
Basophils Absolute: 0 10*3/uL (ref 0.0–0.1)
Basophils Relative: 0 %
Eosinophils Absolute: 0.2 10*3/uL (ref 0.0–0.5)
Eosinophils Relative: 2 %
HCT: 36.5 % — ABNORMAL LOW (ref 39.0–52.0)
Hemoglobin: 11.6 g/dL — ABNORMAL LOW (ref 13.0–17.0)
Immature Granulocytes: 0 %
Lymphocytes Relative: 6 %
Lymphs Abs: 0.7 10*3/uL (ref 0.7–4.0)
MCH: 29 pg (ref 26.0–34.0)
MCHC: 31.8 g/dL (ref 30.0–36.0)
MCV: 91.3 fL (ref 80.0–100.0)
Monocytes Absolute: 0.9 10*3/uL (ref 0.1–1.0)
Monocytes Relative: 7 %
Neutro Abs: 11 10*3/uL — ABNORMAL HIGH (ref 1.7–7.7)
Neutrophils Relative %: 85 %
Platelets: 390 10*3/uL (ref 150–400)
RBC: 4 MIL/uL — ABNORMAL LOW (ref 4.22–5.81)
RDW: 17.1 % — ABNORMAL HIGH (ref 11.5–15.5)
WBC: 12.9 10*3/uL — ABNORMAL HIGH (ref 4.0–10.5)
nRBC: 0 % (ref 0.0–0.2)

## 2021-03-26 LAB — URINALYSIS, ROUTINE W REFLEX MICROSCOPIC
Bilirubin Urine: NEGATIVE
Glucose, UA: NEGATIVE mg/dL
Ketones, ur: NEGATIVE mg/dL
Nitrite: POSITIVE — AB
Protein, ur: NEGATIVE mg/dL
RBC / HPF: >50 RBC/hpf — ABNORMAL HIGH (ref 0–5)
Specific Gravity, Urine: 1.013 (ref 1.005–1.030)
WBC, UA: >50 WBC/hpf — ABNORMAL HIGH (ref 0–5)
pH: 8 (ref 5.0–8.0)

## 2021-03-26 LAB — PROTIME-INR
INR: 1.1 (ref 0.8–1.2)
Prothrombin Time: 14 seconds (ref 11.4–15.2)

## 2021-03-26 LAB — BLOOD CULTURE ID PANEL (REFLEXED) - BCID2

## 2021-03-26 LAB — COMPREHENSIVE METABOLIC PANEL
ALT: 31 U/L (ref 0–44)
AST: 25 U/L (ref 15–41)
Albumin: 3.2 g/dL — ABNORMAL LOW (ref 3.5–5.0)
Alkaline Phosphatase: 110 U/L (ref 38–126)
Anion gap: 9 (ref 5–15)
BUN: 25 mg/dL — ABNORMAL HIGH (ref 8–23)
CO2: 33 mmol/L — ABNORMAL HIGH (ref 22–32)
Calcium: 9.6 mg/dL (ref 8.9–10.3)
Chloride: 99 mmol/L (ref 98–111)
Creatinine, Ser: 0.56 mg/dL — ABNORMAL LOW (ref 0.61–1.24)
GFR, Estimated: >60 mL/min (ref >60)
Glucose, Bld: 106 mg/dL — ABNORMAL HIGH (ref 70–99)
Potassium: 4.1 mmol/L (ref 3.5–5.1)
Sodium: 141 mmol/L (ref 135–145)
Total Bilirubin: 0.2 mg/dL — ABNORMAL LOW (ref 0.3–1.2)
Total Protein: 6.8 g/dL (ref 6.5–8.1)

## 2021-03-26 LAB — RESP PANEL BY RT-PCR (FLU A&B, COVID) ARPGX2
Influenza A by PCR: NEGATIVE
Influenza B by PCR: NEGATIVE
SARS Coronavirus 2 by RT PCR: NEGATIVE

## 2021-03-26 LAB — LACTIC ACID, PLASMA
Lactic Acid, Venous: 2.3 mmol/L (ref 0.5–1.9)
Lactic Acid, Venous: 1.9 mmol/L (ref 0.5–1.9)

## 2021-03-26 LAB — APTT: aPTT: 30 seconds (ref 24–36)

## 2021-03-26 IMAGING — DX DG CHEST 1V PORT
1 series · 1 of 1 positions shown · non-contrast
Comparison: CT angiogram chest [DATE]. Prior chest radiographs
[DATE] and earlier

CLINICAL DATA: Questionable sepsis. Evaluate for abnormality.
Additional history provided: Respiratory failure.

EXAM:
PORTABLE CHEST 1 VIEW

[chest]
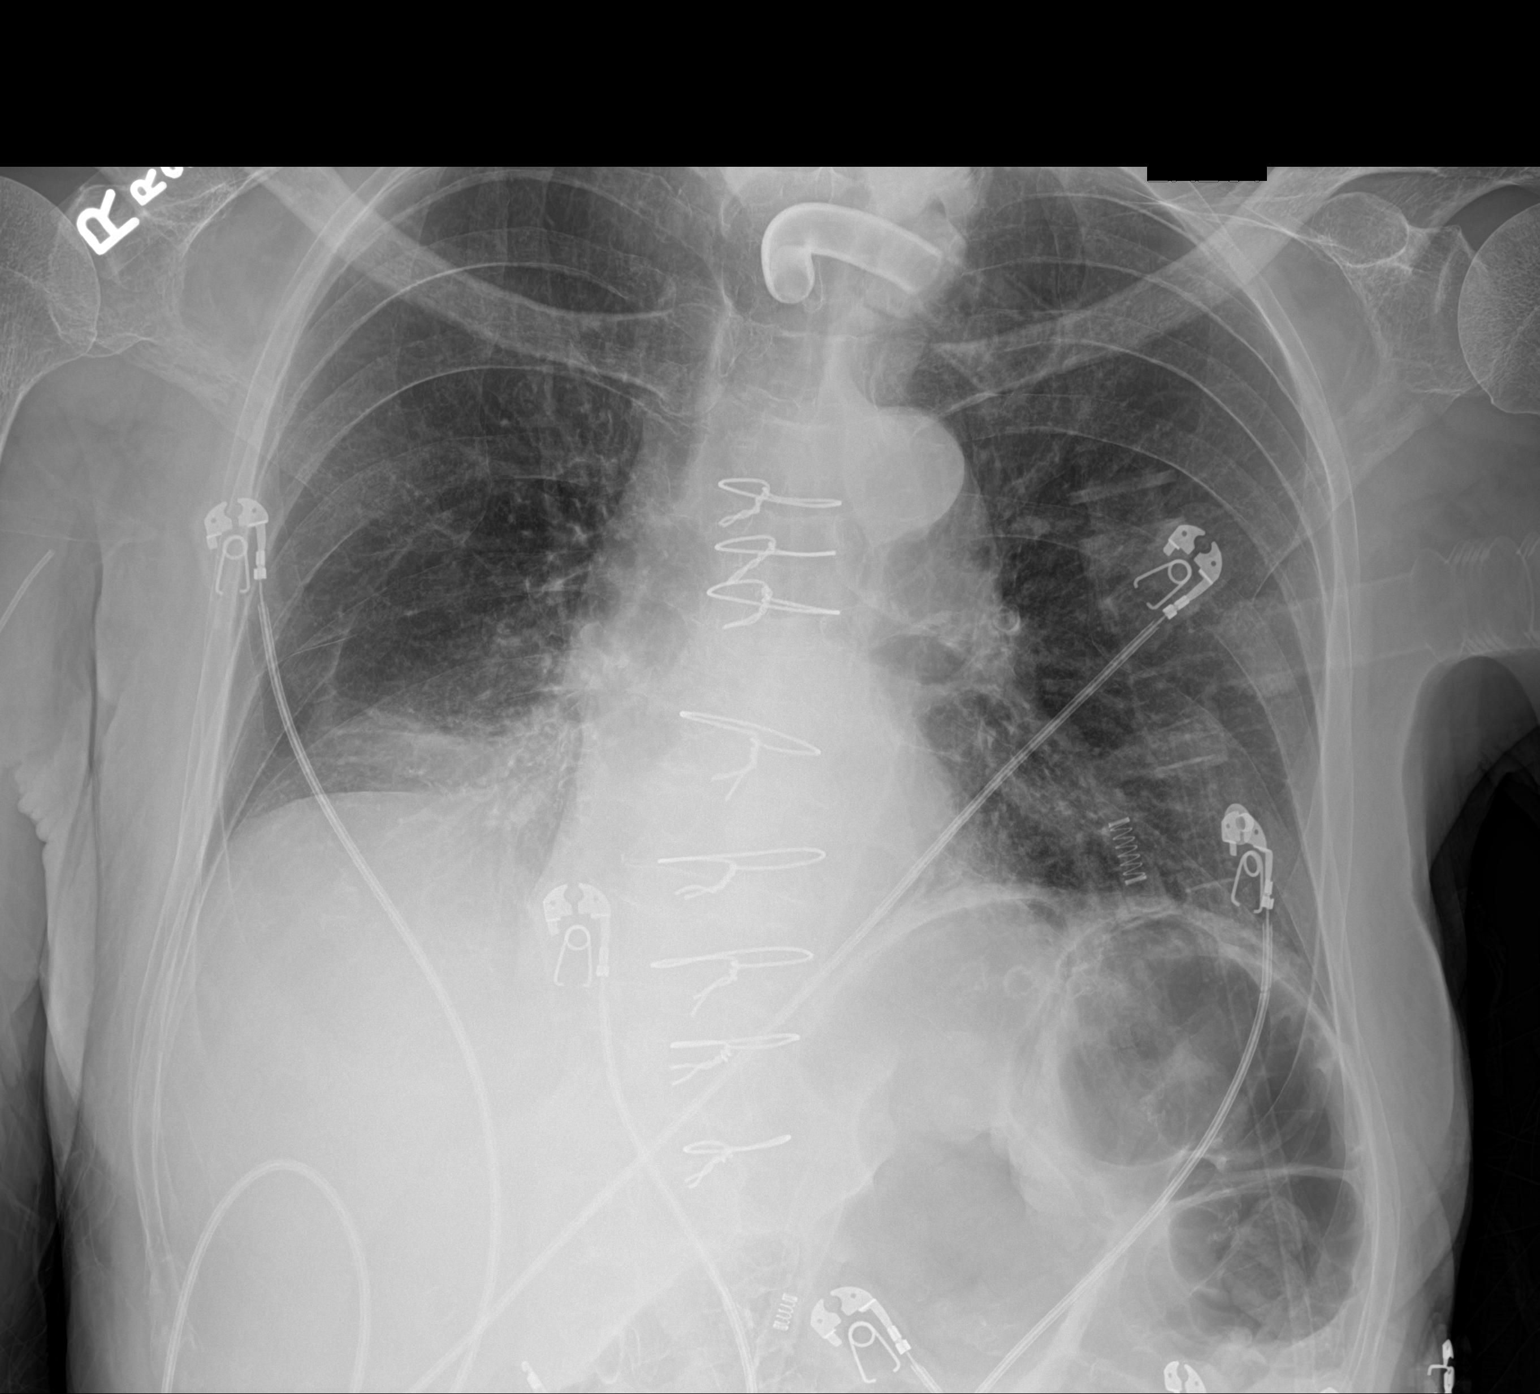

[1 of 1 positions shown; findings below may reference images not displayed]

FINDINGS: Tracheostomy tube in place. Prior median sternotomy. Heart size
within normal limits. Persistent opacity within the right lung base
suspicious for pneumonia. Mild ill-defined opacity within the left
lung base. This is new from the prior chest radiograph and may
reflect atelectasis and/or pneumonia. No appreciable pleural
effusion or pneumothorax. No acute bony abnormality identified.
IMPRESSION: Persistent opacity within the right lung base, likely reflecting
pneumonia given the findings on the chest CT performed yesterday.

Mild ill-defined opacity within the left lung base, new from the
prior chest radiograph of [DATE]. This may reflect atelectasis
and/or pneumonia.

## 2021-03-26 MED ORDER — VANCOMYCIN HCL 1250 MG/250ML IV SOLN
1250.0000 mg | Freq: Once | INTRAVENOUS | Status: AC
  Administered 2021-03-26: 1250 mg via INTRAVENOUS
  Filled 2021-03-26: qty 250

## 2021-03-26 MED ORDER — LORAZEPAM 2 MG/ML IJ SOLN
0.5000 mg | Freq: Once | INTRAMUSCULAR | Status: AC
  Administered 2021-03-26: 0.5 mg via INTRAVENOUS
  Filled 2021-03-26: qty 1

## 2021-03-26 MED ORDER — LACTATED RINGERS IV SOLN: INTRAVENOUS | Status: DC

## 2021-03-26 MED ORDER — SODIUM CHLORIDE 0.9 % IV SOLN
3.0000 g | Freq: Once | INTRAVENOUS | Status: AC
  Administered 2021-03-26: 3 g via INTRAVENOUS
  Filled 2021-03-26: qty 8

## 2021-03-26 MED ORDER — VANCOMYCIN HCL 1250 MG/250ML IV SOLN: 1250.0000 mg | INTRAVENOUS | Status: DC

## 2021-03-26 MED ORDER — SODIUM CHLORIDE 0.9 % IV BOLUS
1000.0000 mL | Freq: Once | INTRAVENOUS | Status: AC
  Administered 2021-03-26: 1000 mL via INTRAVENOUS

## 2021-03-26 MED ORDER — SODIUM CHLORIDE 0.9 % IV BOLUS (SEPSIS)
1000.0000 mL | Freq: Once | INTRAVENOUS | Status: AC
  Administered 2021-03-26: 1000 mL via INTRAVENOUS

## 2021-03-26 NOTE — Progress Notes (Signed)
[facility] following code sepsis °

## 2021-03-26 NOTE — Consult Note (Addendum)
ER Consult   Patient: Austin Davis NTI:[id number] DOB: 07-16-1940 DOA: 03/26/2021 DOS: the patient was seen and examined on 03/26/2021 PCP: Rosaria Ferries, MD  Patient coming from:  [facility]; [place]: Daughter, Drury Ardizzone, [phone]   Chief Complaint: +blood culture  HPI: Austin Davis is a 81 y.o. 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   The patient has a trach/PEG tube.  See yesterday's note regarding communication with his daughter.  He was sent back to [facility] and has been clinically stable but had a positive blood culture and so when [facility] called to report this they sent him back.  He is more awake and alert today and is able to finger spell on his board from [place].    I called and spoke with someone at [facility] - she reports that they  received a call from [facility] saying that he should have been sent back over.  The bed won't be held if he stays at the hospital.  They request ER paperwork and orders.  She will try to reach the physician and will try to get him to call me back.  I spoke with a covering physician.  He was called this AM and said he had bacteremia and [facility] "asked for him back."  He does not mind accepting the patient back.  He would like to be faxed the lab results when they are available and is fine to accept him back.   ER Course:  Patient with bacteremia from yesterday and so they sent him here.     Review of Systems: unable to review all systems due to the inability of the patient to answer questions. Past Medical History:  Diagnosis Date   CHF (congestive heart failure) ([place])    Coronary artery disease    GI bleed 2019   hospitalized at [facility] for one week   Headache    since childhood   Low blood sugar    since childhood, controlled by diet    Mitral regurgitation    Mitral valve prolapse    Osteopenia 2021   Paroxysmal atrial fibrillation [facility])    Past Surgical History:  Procedure Laterality Date   COLONOSCOPY WITH PROPOFOL N/A 02/19/2017   Procedure: COLONOSCOPY WITH PROPOFOL;  Surgeon: Wilford Corner, MD;  Location: [facility] ENDOSCOPY;  Service: Endoscopy;  Laterality: N/A;   IR GASTROSTOMY TUBE MOD SED  10/18/2020   IR IVC FILTER PLMT / S&I /IMG GUID/MOD SED  11/01/2020   laser eye surgery for retina detachment     MITRAL VALVE REPAIR  01/2003   monitor  02/05/2006   polyp removal     TONSILLECTOMY     tooth removal     as a teenager   TRACHEOSTOMY TUBE PLACEMENT N/A 10/26/2020   Procedure: TRACHEOSTOMY;  Surgeon: Rozetta Nunnery, MD;  Location: [place];  Service: ENT;  Laterality: N/A;   Social History:  reports that he has never smoked. He has never used smokeless tobacco. He reports current alcohol use. He reports that he does not use drugs.  Allergies  Allergen Reactions   Prednisone Other (See Comments)    Makes skin crawl, rapid HR   Cortisone Palpitations    Family History  Problem Relation Age of Onset   Heart disease Mother    CAD Mother    Migraines Mother  Heart failure Father    Skin cancer Father    Valvular heart disease Father        prolapsed valve   Tremor Father        "probably had a bit of tremor in his old age"   Heart failure Maternal Grandmother    Pneumonia Maternal Grandfather    Heart failure Paternal Grandmother    Stroke Paternal Grandfather    Asthma Daughter    Epilepsy Son    Parkinson's disease Neg Hx     Prior to Admission medications   Medication Sig Start Date End Date Taking? Authorizing Provider  acetaminophen (TYLENOL) 650 MG CR tablet 650 mg every 8 (eight) hours as needed for pain. Via tube   Yes [provider]  ALPRAZolam Duanne Moron) 0.5 MG tablet Take 0.5 mg by mouth every 8 (eight) hours as needed for anxiety.   Yes [provider]  Amino  Acids-Protein Hydrolys (FEEDING SUPPLEMENT, PRO-STAT SUGAR FREE 64,) LIQD Place 30 mLs into feeding tube 2 (two) times daily.   Yes [provider]  Cholecalciferol (VITAMIN D3 SUPER STRENGTH) 50 MCG (2000 UT) TABS Give 2,000 Units by tube daily.   Yes [provider]  clonazePAM (KLONOPIN) 0.5 MG tablet Place 0.5 mg into feeding tube 2 (two) times daily.   Yes [provider]  escitalopram (LEXAPRO) 10 MG tablet Place 10 mg into feeding tube daily.   Yes [provider]  fexofenadine (ALLEGRA) 180 MG tablet Place 180 mg into feeding tube daily.   Yes [provider]  folic acid (FOLVITE) 1 MG tablet Take 1 tablet (1 mg total) by mouth daily. Patient taking differently: Place 1 mg into feeding tube daily. 09/29/20  Yes [name], MD  furosemide (LASIX) 20 MG tablet Place 20 mg into feeding tube daily.   Yes [provider]  guaiFENesin (ROBITUSSIN) 100 MG/5ML liquid Place 20 mLs into feeding tube 3 (three) times daily.   Yes [provider]  hydrOXYzine (ATARAX) 25 MG tablet Place 25 mg into feeding tube every 6 (six) hours as needed for anxiety.   Yes [provider]  ipratropium-albuterol (DUONEB) 0.5-2.5 (3) MG/3ML SOLN Take 3 mLs by nebulization every 6 (six) hours as needed (shortness of breath).   Yes [provider]  Lactobacillus Rhamnosus, GG, (CULTURELLE) CAPS Give 10 Billion Cells by tube daily.   Yes [provider]  lactulose (CHRONULAC) 10 GM/15ML solution Place 20 g into feeding tube 2 (two) times daily as needed for mild constipation.   Yes [provider]  levalbuterol (XOPENEX) 1.25 MG/3ML nebulizer solution Take 1.25 mg by nebulization every 6 (six) hours. X 30 days starting on 03-23-21   Yes [provider]  levothyroxine (SYNTHROID) 25 MCG tablet Place 25 mcg into feeding tube daily before breakfast.   Yes [provider]  meropenem (MERREM) 1 GM/50ML SOLR  Inject 1 g into the vein every 8 (eight) hours. Started on 03-25-21 x 7 days 03/25/21  Yes [provider]  metoprolol tartrate (LOPRESSOR) 25 MG tablet Place 12.5 mg into feeding tube 2 (two) times daily.   Yes [provider]  midodrine (PROAMATINE) 5 MG tablet Take 1 tablet (5 mg total) by mouth 3 (three) times daily with meals. Patient taking differently: Place 5 mg into feeding tube 3 (three) times daily with meals. 09/28/20  Yes [name], MD  Multiple Vitamin (MULTIVITAMIN WITH MINERALS) TABS tablet Take 1 tablet by mouth daily. Patient taking  differently: Place 1 tablet into feeding tube daily. 09/29/20  Yes [name], MD  Nutritional Supplements (JEVITY 1.5 CAL PO) Give 50 mLs by tube See admin instructions. 50 ml / hr   Yes [provider]  Omega-3 Fatty Acids (FISH OIL PO) Give 1,000 mg by tube daily.   Yes [provider]  pantoprazole sodium (PROTONIX) 40 mg Place 40 mg into feeding tube daily. Granules   Yes [provider]  phenylephrine (SUDAFED PE) 10 MG TABS tablet 10 mg daily as needed (congestion). Via tube   Yes [provider]  polyethylene glycol powder (GLYCOLAX/MIRALAX) 17 GM/SCOOP powder Place 17 g into feeding tube every other day.   Yes [provider]  sodium chloride (OCEAN) 0.65 % SOLN nasal spray Place 1 spray into both nostrils every 4 (four) hours as needed for congestion.   Yes [provider]  Sodium Chloride Flush (NORMAL SALINE FLUSH IV) Inject 10 mLs into the vein as needed (before and after medications).   Yes [provider]  tamsulosin (FLOMAX) 0.4 MG CAPS capsule 0.4 mg daily. Via tube   Yes [provider]  vitamin C (ASCORBIC ACID) 500 MG tablet Place 500 mg into feeding tube daily.   Yes [provider]  zinc gluconate 50 MG tablet Place 50 mg into feeding tube 2 (two) times daily.   Yes [provider]  Menthol-Zinc Oxide  (CALMOSEPTINE EX) Apply 1 application topically See admin instructions. Apply to red areas on sacral and perineal every shift    [provider]  piperacillin-tazobactam (ZOSYN) IVPB Inject 3.375 g into the vein See admin instructions. Every 8 hours x 7 days Patient not taking: Reported on 03/25/2021    [provider]    Physical Exam: Vitals:   03/26/21 1230 03/26/21 1245 03/26/21 1300 03/26/21 1315  BP: 122/89 107/70 133/86 128/64  Pulse: 74 72 73 77  Resp: 13 17 16 15   Temp:      TempSrc:      SpO2: 100% 100% 100% 100%  Weight:      Height:       General:  Appears calm and comfortable, much more alert and interactive today Eyes:  PERRL, EOMI, normal lids, iris ENT:  grossly normal lips & tongue Neck:  trach in place, on vent Cardiovascular:  RRR, no m/r/g. No LE edema.  Respiratory:   CTA.  Normal respiratory effort on ventilator. Abdomen:  soft, NT, ND, +PEG tube Skin:  no rash or induration seen on limited exam Musculoskeletal:  no bony abnormality Psychiatric:  Minimally audible speech but he is able to point to letters today to spell with some accuracy Neurologic:  facial tremor noted with speech, unable to perform otherwise   Radiological Exams on Admission: Independently reviewed - see discussion in A/P where applicable  CT Angio Chest PE W and/or Wo Contrast  Result Date: 03/25/2021 CLINICAL DATA:  Pulmonary embolism suspected, high probability. EXAM: CT ANGIOGRAPHY CHEST WITH CONTRAST TECHNIQUE: Multidetector CT imaging of the chest was performed using the standard protocol during bolus administration of intravenous contrast. Multiplanar CT image reconstructions and MIPs were obtained to evaluate the vascular anatomy. RADIATION DOSE REDUCTION: This exam was performed according to the departmental dose-optimization program which includes automated exposure control, adjustment of the mA and/or kV according to patient size and/or use of iterative  reconstruction technique. CONTRAST:  72mL OMNIPAQUE IOHEXOL 350 MG/ML SOLN COMPARISON:  10/25/2020. FINDINGS: Cardiovascular: The heart is normal in size and there  is no pericardial effusion. Scattered coronary artery calcifications are noted. There is mild atherosclerotic calcification of the aorta without evidence of aneurysm. The pulmonary trunk is normal in caliber. No pulmonary artery filling defect is identified. Mediastinum/Nodes: A tracheostomy tube is noted. No mediastinal, hilar, or axillary lymphadenopathy. The thyroid gland and esophagus are within normal limits. Lungs/Pleura: There is extensive debris in the right mainstem bronchus and bilateral lower lobe bronchioles with consolidation in the right lower lobe and scattered tree-in-bud nodular densities in the lower lobes bilaterally. Small right pleural effusion. No pneumothorax. Upper Abdomen: No acute abnormality. Musculoskeletal: Sternotomy wires are present over the midline. Stable compression deformities are noted in the thoracic spine. No acute osseous abnormality. Review of the MIP images confirms the above findings. IMPRESSION: 1. No evidence of pulmonary embolism. 2. Debris in the right mainstem bronchus and lower lobe bronchioles bilaterally with patchy infiltrate at the left in the lower lobes bilaterally and consolidation in the right lower lobe, concerning for aspiration pneumonia. 3. Small right pleural effusion. 4. Aortic atherosclerosis with coronary artery calcifications. Electronically Signed   By: Brett Fairy M.D.   On: 03/25/2021 05:09   DG Chest Port 1 View  Result Date: 03/26/2021 CLINICAL DATA:  Questionable sepsis. Evaluate for abnormality. Additional history provided: Respiratory failure. EXAM: PORTABLE CHEST 1 VIEW COMPARISON:  CT angiogram chest 03/25/2021. Prior chest radiographs 03/25/2021 and earlier FINDINGS: Tracheostomy tube in place. Prior median sternotomy. Heart size within normal limits. Persistent opacity  within the right lung base suspicious for pneumonia. Mild ill-defined opacity within the left lung base. This is new from the prior chest radiograph and may reflect atelectasis and/or pneumonia. No appreciable pleural effusion or pneumothorax. No acute bony abnormality identified. IMPRESSION: Persistent opacity within the right lung base, likely reflecting pneumonia given the findings on the chest CT performed yesterday. Mild ill-defined opacity within the left lung base, new from the prior chest radiograph of 03/25/2021. This may reflect atelectasis and/or pneumonia. Electronically Signed   By: Kellie Simmering D.O.   On: 03/26/2021 10:41   DG Chest Port 1 View  Result Date: 03/25/2021 CLINICAL DATA:  Respiratory distress, possible aspiration. EXAM: PORTABLE CHEST 1 VIEW COMPARISON:  None. FINDINGS: The heart size and mediastinal contours are stable. Atherosclerotic calcification of the aorta is noted. The tracheostomy tube terminates 6.7 cm above the carina. Patchy airspace disease is present in the medial aspect of the right lower lobe with improved aeration from the prior exam. The left lung is clear. No effusion or pneumothorax. Sternotomy wires are noted over the midline. IMPRESSION: Mild residual airspace disease in the medial aspect of the right lung base with improved aeration from the prior exam. Electronically Signed   By: Brett Fairy M.D.   On: 03/25/2021 03:31    EKG: Independently reviewed.  NSR with rate 77; no evidence of acute ischemia   Labs on Admission: I have personally reviewed the available labs and imaging studies at the time of the admission.  Pertinent labs:    BMP stable Lactate 2.3, 1.9 WBC 12.9, down from 19.4    Assessment and Plan: * Aspiration pneumonia ([place]) -Patient was sent to the ER yesterday for hypoxia despite his ventilator -Evaluation including CTA showed aspiration PNA -TRH was consulted for admission -PCCM was consulted to assist with ventilator  management -Based on stability of the patient including ventilator settings, he was discharged to return to [facility] today for ongoing treatment of aspiration PNA with antibiotics - duration of treatment can be  deferred to them -One of his blood cultures was positive (appears to be a contaminant) and so he was returned to the ER overnight -[facility] is capable of caring for him even with bacteremia, although this appears more likely to be contaminant (reviewed by pharmacy and PCCM, as well) -He is once again stable for return to [facility]   Parkinsonism [facility]) -His daughter reports progressive tremor and increased communication difficulties -Aspiration may be associated with progressive swallow dysfunction -This will need ongoing discussion at [place], as well as goals of care discussion       Based on the overall clinical stability of the patient at this time, in conjunction with the ability of the facility to provide for his current needs, the patient is appropriate for dc back to [facility] at this time.  The EDP, Dr. Gilford Raid, has been made aware.  Thank you for this interesting consult.      Author: Karmen Bongo, MD 03/26/2021 2:10 PM  For on call review [email or web address].

## 2021-03-26 NOTE — ED Provider Notes (Signed)
[facility] EMERGENCY DEPARTMENT Provider Note   CSN: [id number] Arrival date & time: 03/26/21  1443     History  Chief Complaint  Patient presents with   Blood Infection    Austin Davis is a 81 y.o. male.  Pt is a 81 yo male who is vent-dependant at [place].  He has multiple medical problems including afib, chf, cad, mr, gi bleeds, parkinson's disease and severe copd.  Pt came to the ED yesterday for hypoxia down to 80% despite FiO2 on vent 100%.  He was found to have a new aspiration pneumonia.  However, he was stabilized after suctioning and sent home with abx. He was seen by the pulmonologists and the hospitalists prior to d/c.   He had blood cultures drawn yesterday which came back positive for Staph species.  Due to this, pt was sent back here.  Pt has not had a fever.      Home Medications Prior to Admission medications   Medication Sig Start Date End Date Taking? Authorizing Provider  acetaminophen (TYLENOL) 650 MG CR tablet 650 mg every 8 (eight) hours as needed for pain. Via tube   Yes [provider]  ALPRAZolam Duanne Moron) 0.5 MG tablet Take 0.5 mg by mouth every 8 (eight) hours as needed for anxiety.   Yes [provider]  Amino Acids-Protein Hydrolys (FEEDING SUPPLEMENT, PRO-STAT SUGAR FREE 64,) LIQD Place 30 mLs into feeding tube 2 (two) times daily.   Yes [provider]  Cholecalciferol (VITAMIN D3 SUPER STRENGTH) 50 MCG (2000 UT) TABS Give 2,000 Units by tube daily.   Yes [provider]  clonazePAM (KLONOPIN) 0.5 MG tablet Place 0.5 mg into feeding tube 2 (two) times daily.   Yes [provider]  escitalopram (LEXAPRO) 10 MG tablet Place 10 mg into feeding tube daily.   Yes [provider]  fexofenadine (ALLEGRA) 180 MG tablet Place 180 mg into feeding tube daily.   Yes [provider]  folic acid (FOLVITE) 1 MG tablet Take 1 tablet (1 mg total) by mouth daily. Patient taking  differently: Place 1 mg into feeding tube daily. 09/29/20  Yes [name], MD  furosemide (LASIX) 20 MG tablet Place 20 mg into feeding tube daily.   Yes [provider]  guaiFENesin (ROBITUSSIN) 100 MG/5ML liquid Place 20 mLs into feeding tube 3 (three) times daily.   Yes [provider]  hydrOXYzine (ATARAX) 25 MG tablet Place 25 mg into feeding tube every 6 (six) hours as needed for anxiety.   Yes [provider]  ipratropium-albuterol (DUONEB) 0.5-2.5 (3) MG/3ML SOLN Take 3 mLs by nebulization every 6 (six) hours as needed (shortness of breath).   Yes [provider]  Lactobacillus Rhamnosus, GG, (CULTURELLE) CAPS Give 10 Billion Cells by tube daily.   Yes [provider]  lactulose (CHRONULAC) 10 GM/15ML solution Place 20 g into feeding tube 2 (two) times daily as needed for mild constipation.   Yes [provider]  levalbuterol (XOPENEX) 1.25 MG/3ML nebulizer solution Take 1.25 mg by nebulization every 6 (six) hours. X 30 days starting on 03-23-21   Yes [provider]  levothyroxine (SYNTHROID) 25 MCG tablet Place 25 mcg into feeding tube daily before breakfast.   Yes [provider]  meropenem (MERREM) 1 GM/50ML SOLR Inject 1 g into the vein every 8 (eight) hours. Started on 03-25-21 x 7 days 03/25/21  Yes [provider]  metoprolol tartrate (LOPRESSOR) 25 MG tablet Place 12.5  mg into feeding tube 2 (two) times daily.   Yes [provider]  midodrine (PROAMATINE) 5 MG tablet Take 1 tablet (5 mg total) by mouth 3 (three) times daily with meals. Patient taking differently: Place 5 mg into feeding tube 3 (three) times daily with meals. 09/28/20  Yes [name], MD  Multiple Vitamin (MULTIVITAMIN WITH MINERALS) TABS tablet Take 1 tablet by mouth daily. Patient taking differently: Place 1 tablet into feeding tube daily. 09/29/20  Yes [name], MD  Nutritional Supplements (JEVITY 1.5  CAL PO) Give 50 mLs by tube See admin instructions. 50 ml / hr   Yes [provider]  Omega-3 Fatty Acids (FISH OIL PO) Give 1,000 mg by tube daily.   Yes [provider]  pantoprazole sodium (PROTONIX) 40 mg Place 40 mg into feeding tube daily. Granules   Yes [provider]  phenylephrine (SUDAFED PE) 10 MG TABS tablet 10 mg daily as needed (congestion). Via tube   Yes [provider]  polyethylene glycol powder (GLYCOLAX/MIRALAX) 17 GM/SCOOP powder Place 17 g into feeding tube every other day.   Yes [provider]  sodium chloride (OCEAN) 0.65 % SOLN nasal spray Place 1 spray into both nostrils every 4 (four) hours as needed for congestion.   Yes [provider]  Sodium Chloride Flush (NORMAL SALINE FLUSH IV) Inject 10 mLs into the vein as needed (before and after medications).   Yes [provider]  tamsulosin (FLOMAX) 0.4 MG CAPS capsule 0.4 mg daily. Via tube   Yes [provider]  vitamin C (ASCORBIC ACID) 500 MG tablet Place 500 mg into feeding tube daily.   Yes [provider]  zinc gluconate 50 MG tablet Place 50 mg into feeding tube 2 (two) times daily.   Yes [provider]  Menthol-Zinc Oxide (CALMOSEPTINE EX) Apply 1 application topically See admin instructions. Apply to red areas on sacral and perineal every shift    [provider]  piperacillin-tazobactam (ZOSYN) IVPB Inject 3.375 g into the vein See admin instructions. Every 8 hours x 7 days Patient not taking: Reported on 03/25/2021    [provider]      Allergies    Prednisone and Cortisone    Review of Systems   Review of Systems  Unable to perform ROS: Patient nonverbal   Physical Exam Updated Vital Signs BP 128/64    Pulse 77    Temp 98.4 F (36.9 C) (Axillary)    Resp 15    Ht 5\' 7"  (1.702 m)    Wt 62.6 kg    SpO2 100%    BMI 21.60 kg/m  Physical Exam Vitals and nursing note reviewed.  HENT:     Head:  Normocephalic and atraumatic.     Right Ear: External ear normal.     Left Ear: External ear normal.     Nose: Nose normal.     Mouth/Throat:     Mouth: Mucous membranes are moist.     Pharynx: Oropharynx is clear.  Eyes:     Extraocular Movements: Extraocular movements intact.     Conjunctiva/sclera: Conjunctivae normal.     Pupils: Pupils are equal, round, and reactive to light.  Neck:     Comments: Trach in place Cardiovascular:     Rate and Rhythm: Normal rate and regular rhythm.     Pulses: Normal pulses.     Heart sounds: Normal heart sounds.  Pulmonary:     Effort: Pulmonary effort  is normal.     Breath sounds: Normal breath sounds.  Abdominal:     General: Abdomen is flat. Bowel sounds are normal.     Palpations: Abdomen is soft.  Musculoskeletal:        General: Normal range of motion.     Cervical back: Normal range of motion and neck supple.  Skin:    General: Skin is warm.     Capillary Refill: Capillary refill takes less than 2 seconds.  Neurological:     Mental Status: He is alert.     Comments: Following commands    ED Results / Procedures / Treatments   Labs (all labs ordered are listed, but only abnormal results are displayed) Labs Reviewed  LACTIC ACID, PLASMA - Abnormal; Notable for the following components:      Result Value   Lactic Acid, Venous 2.3 (*)    All other components within normal limits  COMPREHENSIVE METABOLIC PANEL - Abnormal; Notable for the following components:   CO2 33 (*)    Glucose, Bld 106 (*)    BUN 25 (*)    Creatinine, Ser 0.56 (*)    Albumin 3.2 (*)    Total Bilirubin 0.2 (*)    All other components within normal limits  CBC WITH DIFFERENTIAL/PLATELET - Abnormal; Notable for the following components:   WBC 12.9 (*)    RBC 4.00 (*)    Hemoglobin 11.6 (*)    HCT 36.5 (*)    RDW 17.1 (*)    Neutro Abs 11.0 (*)    All other components within normal limits  URINALYSIS, ROUTINE W REFLEX MICROSCOPIC - Abnormal; Notable  for the following components:   APPearance CLOUDY (*)    Hgb urine dipstick SMALL (*)    Nitrite POSITIVE (*)    Leukocytes,Ua LARGE (*)    RBC / HPF >50 (*)    WBC, UA >50 (*)    Bacteria, UA MANY (*)    All other components within normal limits  RESP PANEL BY RT-PCR (FLU A&B, COVID) ARPGX2  CULTURE, BLOOD (ROUTINE X 2)  CULTURE, BLOOD (ROUTINE X 2)  URINE CULTURE  LACTIC ACID, PLASMA  PROTIME-INR  APTT    EKG None  Radiology CT Angio Chest PE W and/or Wo Contrast  Result Date: 03/25/2021 CLINICAL DATA:  Pulmonary embolism suspected, high probability. EXAM: CT ANGIOGRAPHY CHEST WITH CONTRAST TECHNIQUE: Multidetector CT imaging of the chest was performed using the standard protocol during bolus administration of intravenous contrast. Multiplanar CT image reconstructions and MIPs were obtained to evaluate the vascular anatomy. RADIATION DOSE REDUCTION: This exam was performed according to the departmental dose-optimization program which includes automated exposure control, adjustment of the mA and/or kV according to patient size and/or use of iterative reconstruction technique. CONTRAST:  71mL OMNIPAQUE IOHEXOL 350 MG/ML SOLN COMPARISON:  10/25/2020. FINDINGS: Cardiovascular: The heart is normal in size and there is no pericardial effusion. Scattered coronary artery calcifications are noted. There is mild atherosclerotic calcification of the aorta without evidence of aneurysm. The pulmonary trunk is normal in caliber. No pulmonary artery filling defect is identified. Mediastinum/Nodes: A tracheostomy tube is noted. No mediastinal, hilar, or axillary lymphadenopathy. The thyroid gland and esophagus are within normal limits. Lungs/Pleura: There is extensive debris in the right mainstem bronchus and bilateral lower lobe bronchioles with consolidation in the right lower lobe and scattered tree-in-bud nodular densities in the lower lobes bilaterally. Small right pleural effusion. No pneumothorax.  Upper Abdomen: No acute abnormality. Musculoskeletal: Sternotomy wires are  present over the midline. Stable compression deformities are noted in the thoracic spine. No acute osseous abnormality. Review of the MIP images confirms the above findings. IMPRESSION: 1. No evidence of pulmonary embolism. 2. Debris in the right mainstem bronchus and lower lobe bronchioles bilaterally with patchy infiltrate at the left in the lower lobes bilaterally and consolidation in the right lower lobe, concerning for aspiration pneumonia. 3. Small right pleural effusion. 4. Aortic atherosclerosis with coronary artery calcifications. Electronically Signed   By: Brett Fairy M.D.   On: 03/25/2021 05:09   DG Chest Port 1 View  Result Date: 03/26/2021 CLINICAL DATA:  Questionable sepsis. Evaluate for abnormality. Additional history provided: Respiratory failure. EXAM: PORTABLE CHEST 1 VIEW COMPARISON:  CT angiogram chest 03/25/2021. Prior chest radiographs 03/25/2021 and earlier FINDINGS: Tracheostomy tube in place. Prior median sternotomy. Heart size within normal limits. Persistent opacity within the right lung base suspicious for pneumonia. Mild ill-defined opacity within the left lung base. This is new from the prior chest radiograph and may reflect atelectasis and/or pneumonia. No appreciable pleural effusion or pneumothorax. No acute bony abnormality identified. IMPRESSION: Persistent opacity within the right lung base, likely reflecting pneumonia given the findings on the chest CT performed yesterday. Mild ill-defined opacity within the left lung base, new from the prior chest radiograph of 03/25/2021. This may reflect atelectasis and/or pneumonia. Electronically Signed   By: Kellie Simmering D.O.   On: 03/26/2021 10:41   DG Chest Port 1 View  Result Date: 03/25/2021 CLINICAL DATA:  Respiratory distress, possible aspiration. EXAM: PORTABLE CHEST 1 VIEW COMPARISON:  None. FINDINGS: The heart size and mediastinal contours are  stable. Atherosclerotic calcification of the aorta is noted. The tracheostomy tube terminates 6.7 cm above the carina. Patchy airspace disease is present in the medial aspect of the right lower lobe with improved aeration from the prior exam. The left lung is clear. No effusion or pneumothorax. Sternotomy wires are noted over the midline. IMPRESSION: Mild residual airspace disease in the medial aspect of the right lung base with improved aeration from the prior exam. Electronically Signed   By: Brett Fairy M.D.   On: 03/25/2021 03:31    Procedures Procedures    Medications Ordered in ED Medications  lactated ringers infusion ( Intravenous New Bag/Given 03/26/21 1038)  vancomycin (VANCOREADY) IVPB 1250 mg/250 mL (1,250 mg Intravenous New Bag/Given 03/26/21 1335)  vancomycin (VANCOREADY) IVPB 1250 mg/250 mL (has no administration in time range)  sodium chloride 0.9 % bolus 1,000 mL (0 mLs Intravenous Stopped 03/26/21 1230)  Ampicillin-Sulbactam (UNASYN) 3 g in sodium chloride 0.9 % 100 mL IVPB (0 g Intravenous Stopped 03/26/21 1137)  sodium chloride 0.9 % bolus 1,000 mL (0 mLs Intravenous Stopped 03/26/21 1331)    ED Course/ Medical Decision Making/ A&P                           Medical Decision Making Amount and/or Complexity of Data Reviewed Labs: ordered. Radiology: ordered. ECG/medicine tests: ordered.  Risk Prescription drug management.   This patient presents to the ED for concern of sepsis, this involves an extensive number of treatment options, and is a complaint that carries with it a high risk of complications and morbidity.  The differential diagnosis includes infection, cardiac, pulmonary   Co morbidities that complicate the patient evaluation  Vent dependent, afib, chf, cad, mr, gi bleeds, parkinson's disease and severe copd   Additional history obtained:  Additional history obtained from epic  chart review External records from outside source obtained and reviewed including  [facility] Report   Lab Tests:  I Ordered, and personally interpreted labs.  The pertinent results include:  UA + for UTI, Lactic is elevated to 2.3, CMP unchanged, Covid/flu neg.  2nd lactic down after IVFs   Imaging Studies ordered:  I ordered imaging studies including cxr  I independently visualized and interpreted imaging which showed    IMPRESSION:  Persistent opacity within the right lung base, likely reflecting  pneumonia given the findings on the chest CT performed yesterday.     Mild ill-defined opacity within the left lung base, new from the  prior chest radiograph of 03/25/2021. This may reflect atelectasis  and/or pneumonia.   I agree with the radiologist interpretation   Cardiac Monitoring:  The patient was maintained on a cardiac monitor.  I personally viewed and interpreted the cardiac monitored which showed an underlying rhythm of: nsr   Medicines ordered and prescription drug management:  I ordered medication including unasyn  for aspiration pneumonia and uti/vanc for the staph species Reevaluation of the patient after these medicines showed that the patient stayed the same I have reviewed the patients home medicines and have made adjustments as needed   Test Considered:  Ct chest, but it was done yesterday   Critical Interventions:  IVFs and IV abx   Consultations Obtained:  I requested consultation with the hospitalist and pulmonologist,  and discussed lab and imaging findings as well as pertinent plan - they recommend: d/c back to [facility].  Dr. Tacy Learn (pulmonology) said they can continue giving IV abx at [place].  Dr. Lorin Mercy saw him yesterday and said he looks much better than yesterday.  She spoke with pt's daughter and with the doctor at [place] so that everyone is on the same page.   Problem List / ED Course:  Sepsis vs bacteremia vs contaminate.  Lactic is elevated.  Initial bp ok, but it is dropping despite fluids, so he is given additional  IVFs.  BP is now better and lactic is improving.  Repeat cultures obtained. Aspiration pneumonia:  IV unasyn/vancomycin.  UTI:  unasyn should cover   Reevaluation:  After the interventions noted above, I reevaluated the patient and found that they have  improved   Social Determinants of Health:  Lives at [place]:  After consideration of the diagnostic results and the patients response to treatment, I feel that the patent would benefit from discharge back to [facility].          Final Clinical Impression(s) / ED Diagnoses Final diagnoses:  Aspiration pneumonia of both lower lobes, unspecified aspiration pneumonia type ([place])  Urinary tract infection associated with indwelling urethral catheter, initial encounter [facility])    Rx / [place] Orders ED Discharge Orders     None         Isla Pence, MD 03/26/21 1346

## 2021-03-26 NOTE — Progress Notes (Addendum)
Pharmacy Antibiotic Note ? ?LEXANDER Davis is a 81 y.o. male admitted on 03/26/2021 with sepsis.  Pharmacy has been consulted for vancomycin dosing for concern of bacteremia. Patient with 1/2 staph species in one blood culture from 3/6 and other blood culture from 3/6 pending. Blood cultures with staph species. Afeb. Wbc 12.9. LD increased from 1.4 yesterday to 2.3 today.  ? ?Plan: ?Vancomycin 1250mg  x1 loading dose followed by vancomycin 1250mg  IV q24h (eAUC 474 using scr 0.8 and TBW for Ke/CrCl) ?Monitor renal function, cultures, and infectious workup ?Vancomycin levels as indicated ? ?Height: 5\' 7"  (170.2 cm) ?IBW/kg (Calculated) : 66.1 ? ?Temp (24hrs), Avg:98.4 ?F (36.9 ?C), Min:98.4 ?F (36.9 ?C), Max:98.4 ?F (36.9 ?C) ? ?Recent Labs  ?Lab 03/25/21 ?0243 03/25/21 ?0618 03/26/21 ?1036  ?WBC 19.4*  --  12.9*  ?CREATININE 0.62  --  0.56*  ?LATICACIDVEN  --  1.4 2.3*  ?  ?CrCl cannot be calculated (Unknown ideal weight.).   ? ?Allergies  ?Allergen Reactions  ? Prednisone Other (See Comments)  ?  Makes skin crawl, rapid HR  ? Cortisone Palpitations  ? ? ?Antimicrobials this admission: ?Vancomycin 3/7 >> ? ?Dose adjustments this admission: ? ? ?Microbiology results: ?3/6 bcx: 1/2 staph species  ?3/6 bcx: ?3/7 bcx:  ?3/7 ucx: ? ?Thank you for allowing pharmacy to be a part of this patient?s care. ? ?Cristela Felt, PharmD, BCPS ?Clinical Pharmacist ?03/26/2021 12:04 PM ? ? ?

## 2021-03-26 NOTE — ED Triage Notes (Signed)
Pt BIB [facility] from [facility] for re-eval of staph infection. Cultures from his admit yesterday were positive for staph  ?

## 2021-03-26 NOTE — Telephone Encounter (Signed)
Received positive blood culture results from [facility] lab. Pt is a resident at [place] and spoke with Angela Nevin RN and faxed blood culture results to her. She will contact his doctor there for further treatment.  ?

## 2021-03-28 LAB — URINE CULTURE: Culture: 30000 — AB

## 2021-03-28 LAB — CULTURE, BLOOD (ROUTINE X 2)

## 2021-03-30 LAB — CULTURE, BLOOD (ROUTINE X 2)
Culture: NO GROWTH
Special Requests: ADEQUATE

## 2021-03-31 LAB — CULTURE, BLOOD (ROUTINE X 2)
Culture: NO GROWTH
Culture: NO GROWTH
Special Requests: ADEQUATE

## 2021-04-03 DIAGNOSIS — M79675 Pain in left toe(s): Secondary | ICD-10-CM | POA: Diagnosis not present

## 2021-04-03 DIAGNOSIS — M2041 Other hammer toe(s) (acquired), right foot: Secondary | ICD-10-CM | POA: Diagnosis not present

## 2021-04-03 DIAGNOSIS — B351 Tinea unguium: Secondary | ICD-10-CM | POA: Diagnosis not present

## 2021-04-03 DIAGNOSIS — L84 Corns and callosities: Secondary | ICD-10-CM | POA: Diagnosis not present

## 2021-04-03 DIAGNOSIS — I739 Peripheral vascular disease, unspecified: Secondary | ICD-10-CM | POA: Diagnosis not present

## 2021-04-07 DIAGNOSIS — J9620 Acute and chronic respiratory failure, unspecified whether with hypoxia or hypercapnia: Secondary | ICD-10-CM | POA: Diagnosis not present

## 2021-04-11 ENCOUNTER — Inpatient Hospital Stay (HOSPITAL_COMMUNITY)
Admission: EM | Admit: 2021-04-11 | Discharge: 2021-04-16 | DRG: 207 | Disposition: A | Discharge status: Skilled Nursing Facility | Payer: Medicare Other | Source: Skilled Nursing Facility | Attending: Internal Medicine | Admitting: Internal Medicine

## 2021-04-11 ENCOUNTER — Emergency Department (HOSPITAL_COMMUNITY): Payer: Medicare Other

## 2021-04-11 ENCOUNTER — Other Ambulatory Visit: Payer: Self-pay

## 2021-04-11 ENCOUNTER — Encounter (HOSPITAL_COMMUNITY): Payer: Self-pay

## 2021-04-11 ENCOUNTER — Inpatient Hospital Stay (HOSPITAL_COMMUNITY): Payer: Medicare Other

## 2021-04-11 DIAGNOSIS — I341 Nonrheumatic mitral (valve) prolapse: Secondary | ICD-10-CM | POA: Diagnosis present

## 2021-04-11 DIAGNOSIS — I5022 Chronic systolic (congestive) heart failure: Secondary | ICD-10-CM | POA: Diagnosis present

## 2021-04-11 DIAGNOSIS — Z79899 Other long term (current) drug therapy: Secondary | ICD-10-CM | POA: Diagnosis not present

## 2021-04-11 DIAGNOSIS — T17890A Other foreign object in other parts of respiratory tract causing asphyxiation, initial encounter: Secondary | ICD-10-CM | POA: Diagnosis present

## 2021-04-11 DIAGNOSIS — Z825 Family history of asthma and other chronic lower respiratory diseases: Secondary | ICD-10-CM

## 2021-04-11 DIAGNOSIS — J449 Chronic obstructive pulmonary disease, unspecified: Secondary | ICD-10-CM | POA: Diagnosis present

## 2021-04-11 DIAGNOSIS — J9811 Atelectasis: Secondary | ICD-10-CM | POA: Diagnosis present

## 2021-04-11 DIAGNOSIS — Z931 Gastrostomy status: Secondary | ICD-10-CM

## 2021-04-11 DIAGNOSIS — J9622 Acute and chronic respiratory failure with hypercapnia: Secondary | ICD-10-CM | POA: Diagnosis present

## 2021-04-11 DIAGNOSIS — J9621 Acute and chronic respiratory failure with hypoxia: Secondary | ICD-10-CM | POA: Diagnosis present

## 2021-04-11 DIAGNOSIS — J9311 Primary spontaneous pneumothorax: Secondary | ICD-10-CM | POA: Diagnosis not present

## 2021-04-11 DIAGNOSIS — N400 Enlarged prostate without lower urinary tract symptoms: Secondary | ICD-10-CM | POA: Diagnosis present

## 2021-04-11 DIAGNOSIS — Z888 Allergy status to other drugs, medicaments and biological substances status: Secondary | ICD-10-CM | POA: Diagnosis not present

## 2021-04-11 DIAGNOSIS — R627 Adult failure to thrive: Secondary | ICD-10-CM | POA: Diagnosis present

## 2021-04-11 DIAGNOSIS — M8580 Other specified disorders of bone density and structure, unspecified site: Secondary | ICD-10-CM | POA: Diagnosis present

## 2021-04-11 DIAGNOSIS — E039 Hypothyroidism, unspecified: Secondary | ICD-10-CM | POA: Diagnosis not present

## 2021-04-11 DIAGNOSIS — I5082 Biventricular heart failure: Secondary | ICD-10-CM | POA: Diagnosis present

## 2021-04-11 DIAGNOSIS — R0989 Other specified symptoms and signs involving the circulatory and respiratory systems: Secondary | ICD-10-CM | POA: Diagnosis not present

## 2021-04-11 DIAGNOSIS — J930 Spontaneous tension pneumothorax: Secondary | ICD-10-CM | POA: Diagnosis not present

## 2021-04-11 DIAGNOSIS — N319 Neuromuscular dysfunction of bladder, unspecified: Secondary | ICD-10-CM | POA: Diagnosis present

## 2021-04-11 DIAGNOSIS — K219 Gastro-esophageal reflux disease without esophagitis: Secondary | ICD-10-CM | POA: Diagnosis present

## 2021-04-11 DIAGNOSIS — Z808 Family history of malignant neoplasm of other organs or systems: Secondary | ICD-10-CM

## 2021-04-11 DIAGNOSIS — Z8249 Family history of ischemic heart disease and other diseases of the circulatory system: Secondary | ICD-10-CM

## 2021-04-11 DIAGNOSIS — I4891 Unspecified atrial fibrillation: Secondary | ICD-10-CM | POA: Diagnosis present

## 2021-04-11 DIAGNOSIS — Z820 Family history of epilepsy and other diseases of the nervous system: Secondary | ICD-10-CM

## 2021-04-11 DIAGNOSIS — Z79890 Hormone replacement therapy (postmenopausal): Secondary | ICD-10-CM

## 2021-04-11 DIAGNOSIS — I739 Peripheral vascular disease, unspecified: Secondary | ICD-10-CM | POA: Diagnosis not present

## 2021-04-11 DIAGNOSIS — Y92129 Unspecified place in nursing home as the place of occurrence of the external cause: Secondary | ICD-10-CM

## 2021-04-11 DIAGNOSIS — R1310 Dysphagia, unspecified: Secondary | ICD-10-CM | POA: Diagnosis not present

## 2021-04-11 DIAGNOSIS — E43 Unspecified severe protein-calorie malnutrition: Secondary | ICD-10-CM | POA: Diagnosis present

## 2021-04-11 DIAGNOSIS — I480 Paroxysmal atrial fibrillation: Secondary | ICD-10-CM | POA: Diagnosis not present

## 2021-04-11 DIAGNOSIS — F419 Anxiety disorder, unspecified: Secondary | ICD-10-CM | POA: Diagnosis not present

## 2021-04-11 DIAGNOSIS — Z930 Tracheostomy status: Secondary | ICD-10-CM | POA: Diagnosis not present

## 2021-04-11 DIAGNOSIS — Z9911 Dependence on respirator [ventilator] status: Secondary | ICD-10-CM | POA: Diagnosis not present

## 2021-04-11 DIAGNOSIS — M6281 Muscle weakness (generalized): Secondary | ICD-10-CM | POA: Diagnosis not present

## 2021-04-11 DIAGNOSIS — Z431 Encounter for attention to gastrostomy: Secondary | ICD-10-CM | POA: Diagnosis not present

## 2021-04-11 DIAGNOSIS — R519 Headache, unspecified: Secondary | ICD-10-CM | POA: Diagnosis not present

## 2021-04-11 DIAGNOSIS — I9589 Other hypotension: Secondary | ICD-10-CM | POA: Diagnosis not present

## 2021-04-11 DIAGNOSIS — Z430 Encounter for attention to tracheostomy: Secondary | ICD-10-CM | POA: Diagnosis not present

## 2021-04-11 DIAGNOSIS — Z20822 Contact with and (suspected) exposure to covid-19: Secondary | ICD-10-CM | POA: Diagnosis present

## 2021-04-11 DIAGNOSIS — R64 Cachexia: Secondary | ICD-10-CM | POA: Diagnosis present

## 2021-04-11 DIAGNOSIS — F32A Depression, unspecified: Secondary | ICD-10-CM | POA: Diagnosis not present

## 2021-04-11 DIAGNOSIS — Z6820 Body mass index (BMI) 20.0-20.9, adult: Secondary | ICD-10-CM

## 2021-04-11 DIAGNOSIS — J9620 Acute and chronic respiratory failure, unspecified whether with hypoxia or hypercapnia: Secondary | ICD-10-CM | POA: Diagnosis not present

## 2021-04-11 DIAGNOSIS — J939 Pneumothorax, unspecified: Secondary | ICD-10-CM | POA: Diagnosis not present

## 2021-04-11 DIAGNOSIS — J95811 Postprocedural pneumothorax: Secondary | ICD-10-CM | POA: Diagnosis not present

## 2021-04-11 DIAGNOSIS — R000 Tachycardia, unspecified: Secondary | ICD-10-CM | POA: Diagnosis not present

## 2021-04-11 DIAGNOSIS — G894 Chronic pain syndrome: Secondary | ICD-10-CM | POA: Diagnosis not present

## 2021-04-11 DIAGNOSIS — R918 Other nonspecific abnormal finding of lung field: Secondary | ICD-10-CM | POA: Diagnosis not present

## 2021-04-11 DIAGNOSIS — I2510 Atherosclerotic heart disease of native coronary artery without angina pectoris: Secondary | ICD-10-CM | POA: Diagnosis present

## 2021-04-11 DIAGNOSIS — I5023 Acute on chronic systolic (congestive) heart failure: Secondary | ICD-10-CM | POA: Diagnosis not present

## 2021-04-11 DIAGNOSIS — R278 Other lack of coordination: Secondary | ICD-10-CM | POA: Diagnosis not present

## 2021-04-11 DIAGNOSIS — J189 Pneumonia, unspecified organism: Secondary | ICD-10-CM | POA: Diagnosis not present

## 2021-04-11 DIAGNOSIS — G4733 Obstructive sleep apnea (adult) (pediatric): Secondary | ICD-10-CM | POA: Diagnosis not present

## 2021-04-11 DIAGNOSIS — Z823 Family history of stroke: Secondary | ICD-10-CM

## 2021-04-11 DIAGNOSIS — R0902 Hypoxemia: Secondary | ICD-10-CM | POA: Diagnosis not present

## 2021-04-11 HISTORY — DX: Essential tremor: G25.0

## 2021-04-11 LAB — COMPREHENSIVE METABOLIC PANEL
ALT: 27 U/L (ref 0–44)
AST: 24 U/L (ref 15–41)
Albumin: 3.1 g/dL — ABNORMAL LOW (ref 3.5–5.0)
Alkaline Phosphatase: 132 U/L — ABNORMAL HIGH (ref 38–126)
Anion gap: 9 (ref 5–15)
BUN: 31 mg/dL — ABNORMAL HIGH (ref 8–23)
CO2: 34 mmol/L — ABNORMAL HIGH (ref 22–32)
Calcium: 9.4 mg/dL (ref 8.9–10.3)
Chloride: 97 mmol/L — ABNORMAL LOW (ref 98–111)
Creatinine, Ser: 0.52 mg/dL — ABNORMAL LOW (ref 0.61–1.24)
GFR, Estimated: >60 mL/min (ref >60)
Glucose, Bld: 127 mg/dL — ABNORMAL HIGH (ref 70–99)
Potassium: 4.2 mmol/L (ref 3.5–5.1)
Sodium: 140 mmol/L (ref 135–145)
Total Bilirubin: 0.4 mg/dL (ref 0.3–1.2)
Total Protein: 6.6 g/dL (ref 6.5–8.1)

## 2021-04-11 LAB — CBC WITH DIFFERENTIAL/PLATELET
Abs Immature Granulocytes: 0.05 10*3/uL (ref 0.00–0.07)
Basophils Absolute: 0 10*3/uL (ref 0.0–0.1)
Basophils Relative: 0 %
Eosinophils Absolute: 0.6 10*3/uL — ABNORMAL HIGH (ref 0.0–0.5)
Eosinophils Relative: 6 %
HCT: 37.2 % — ABNORMAL LOW (ref 39.0–52.0)
Hemoglobin: 11.6 g/dL — ABNORMAL LOW (ref 13.0–17.0)
Immature Granulocytes: 1 %
Lymphocytes Relative: 6 %
Lymphs Abs: 0.7 10*3/uL (ref 0.7–4.0)
MCH: 28.4 pg (ref 26.0–34.0)
MCHC: 31.2 g/dL (ref 30.0–36.0)
MCV: 91 fL (ref 80.0–100.0)
Monocytes Absolute: 0.9 10*3/uL (ref 0.1–1.0)
Monocytes Relative: 8 %
Neutro Abs: 8.5 10*3/uL — ABNORMAL HIGH (ref 1.7–7.7)
Neutrophils Relative %: 79 %
Platelets: 291 10*3/uL (ref 150–400)
RBC: 4.09 MIL/uL — ABNORMAL LOW (ref 4.22–5.81)
RDW: 17.2 % — ABNORMAL HIGH (ref 11.5–15.5)
WBC: 10.8 10*3/uL — ABNORMAL HIGH (ref 4.0–10.5)
nRBC: 0 % (ref 0.0–0.2)

## 2021-04-11 LAB — CBC
HCT: 34.4 % — ABNORMAL LOW (ref 39.0–52.0)
Hemoglobin: 11 g/dL — ABNORMAL LOW (ref 13.0–17.0)
MCH: 28.9 pg (ref 26.0–34.0)
MCHC: 32 g/dL (ref 30.0–36.0)
MCV: 90.5 fL (ref 80.0–100.0)
Platelets: 230 10*3/uL (ref 150–400)
RBC: 3.8 MIL/uL — ABNORMAL LOW (ref 4.22–5.81)
RDW: 17.3 % — ABNORMAL HIGH (ref 11.5–15.5)
WBC: 8.8 10*3/uL (ref 4.0–10.5)
nRBC: 0 % (ref 0.0–0.2)

## 2021-04-11 LAB — GLUCOSE, CAPILLARY
Glucose-Capillary: 97 mg/dL (ref 70–99)
Glucose-Capillary: 172 mg/dL — ABNORMAL HIGH (ref 70–99)
Glucose-Capillary: 89 mg/dL (ref 70–99)

## 2021-04-11 LAB — HEMOGLOBIN A1C
Hgb A1c MFr Bld: 5.5 % (ref 4.8–5.6)
Mean Plasma Glucose: 111.15 mg/dL

## 2021-04-11 LAB — CREATININE, SERUM
Creatinine, Ser: 0.54 mg/dL — ABNORMAL LOW (ref 0.61–1.24)
GFR, Estimated: >60 mL/min (ref >60)

## 2021-04-11 LAB — LACTIC ACID, PLASMA: Lactic Acid, Venous: 1.3 mmol/L (ref 0.5–1.9)

## 2021-04-11 LAB — MRSA NEXT GEN BY PCR, NASAL: MRSA by PCR Next Gen: NOT DETECTED

## 2021-04-11 IMAGING — DX DG CHEST 1V PORT
1 series · 1 of 1 positions shown · non-contrast
Comparison: [DATE]

CLINICAL DATA: Pneumonia

Pneumothorax?
Desaturating
EXAM:
PORTABLE CHEST - 1 VIEW

[chest]
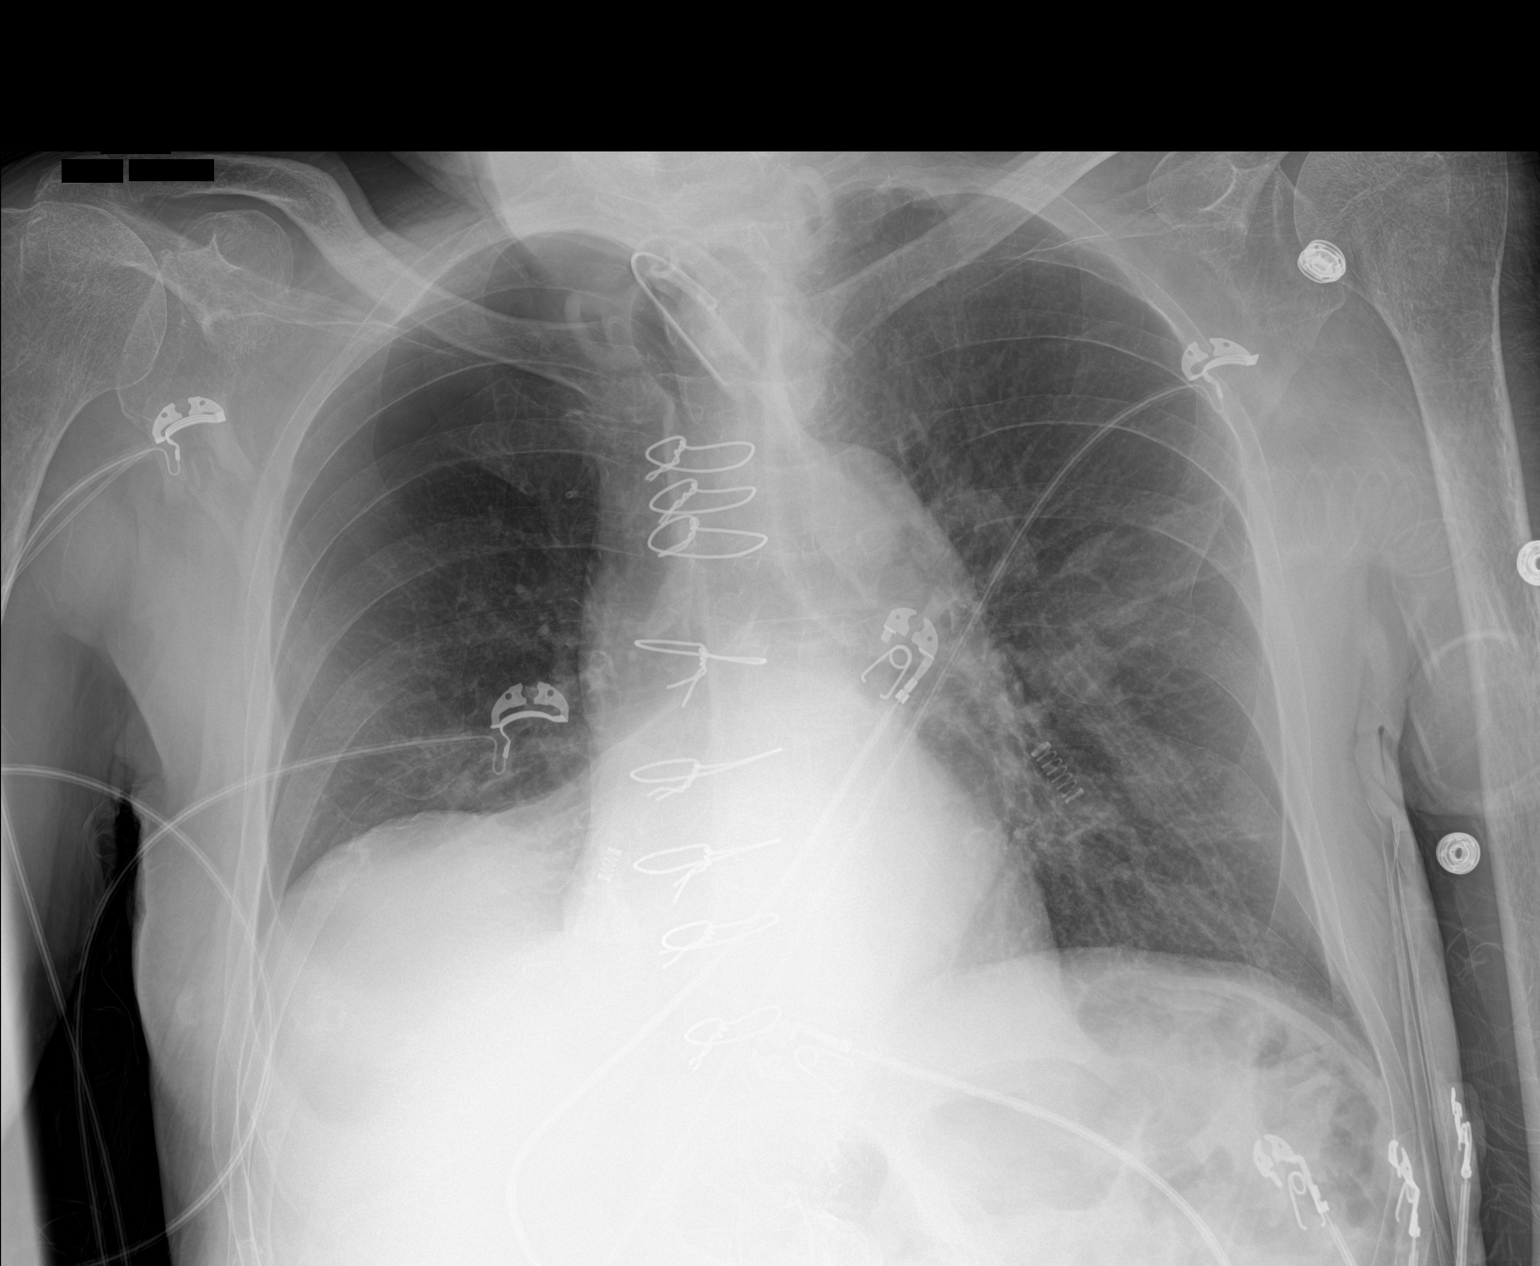

[1 of 1 positions shown; findings below may reference images not displayed]

FINDINGS: Cardiomediastinal silhouette and pulmonary vasculature are within
normal limits.

Postop changes median sternotomy again seen.

Interval increase of right medial lung base opacity suspicious for
atelectasis of the right middle lobe.

Moderate size right pneumothorax is now present.

Tracheostomy cannula in appropriate position.
IMPRESSION: 1. Moderate sized right pneumothorax.
2. Worsening atelectasis of right middle lobe.

## 2021-04-11 IMAGING — DX DG CHEST 1V PORT
2 series · 2 of 2 positions shown · non-contrast
Comparison: [DATE] at [DATE] p.m.

CLINICAL DATA: Pneumothorax

EXAM:
PORTABLE CHEST 1 VIEW

[chest ap (1 of 2)]
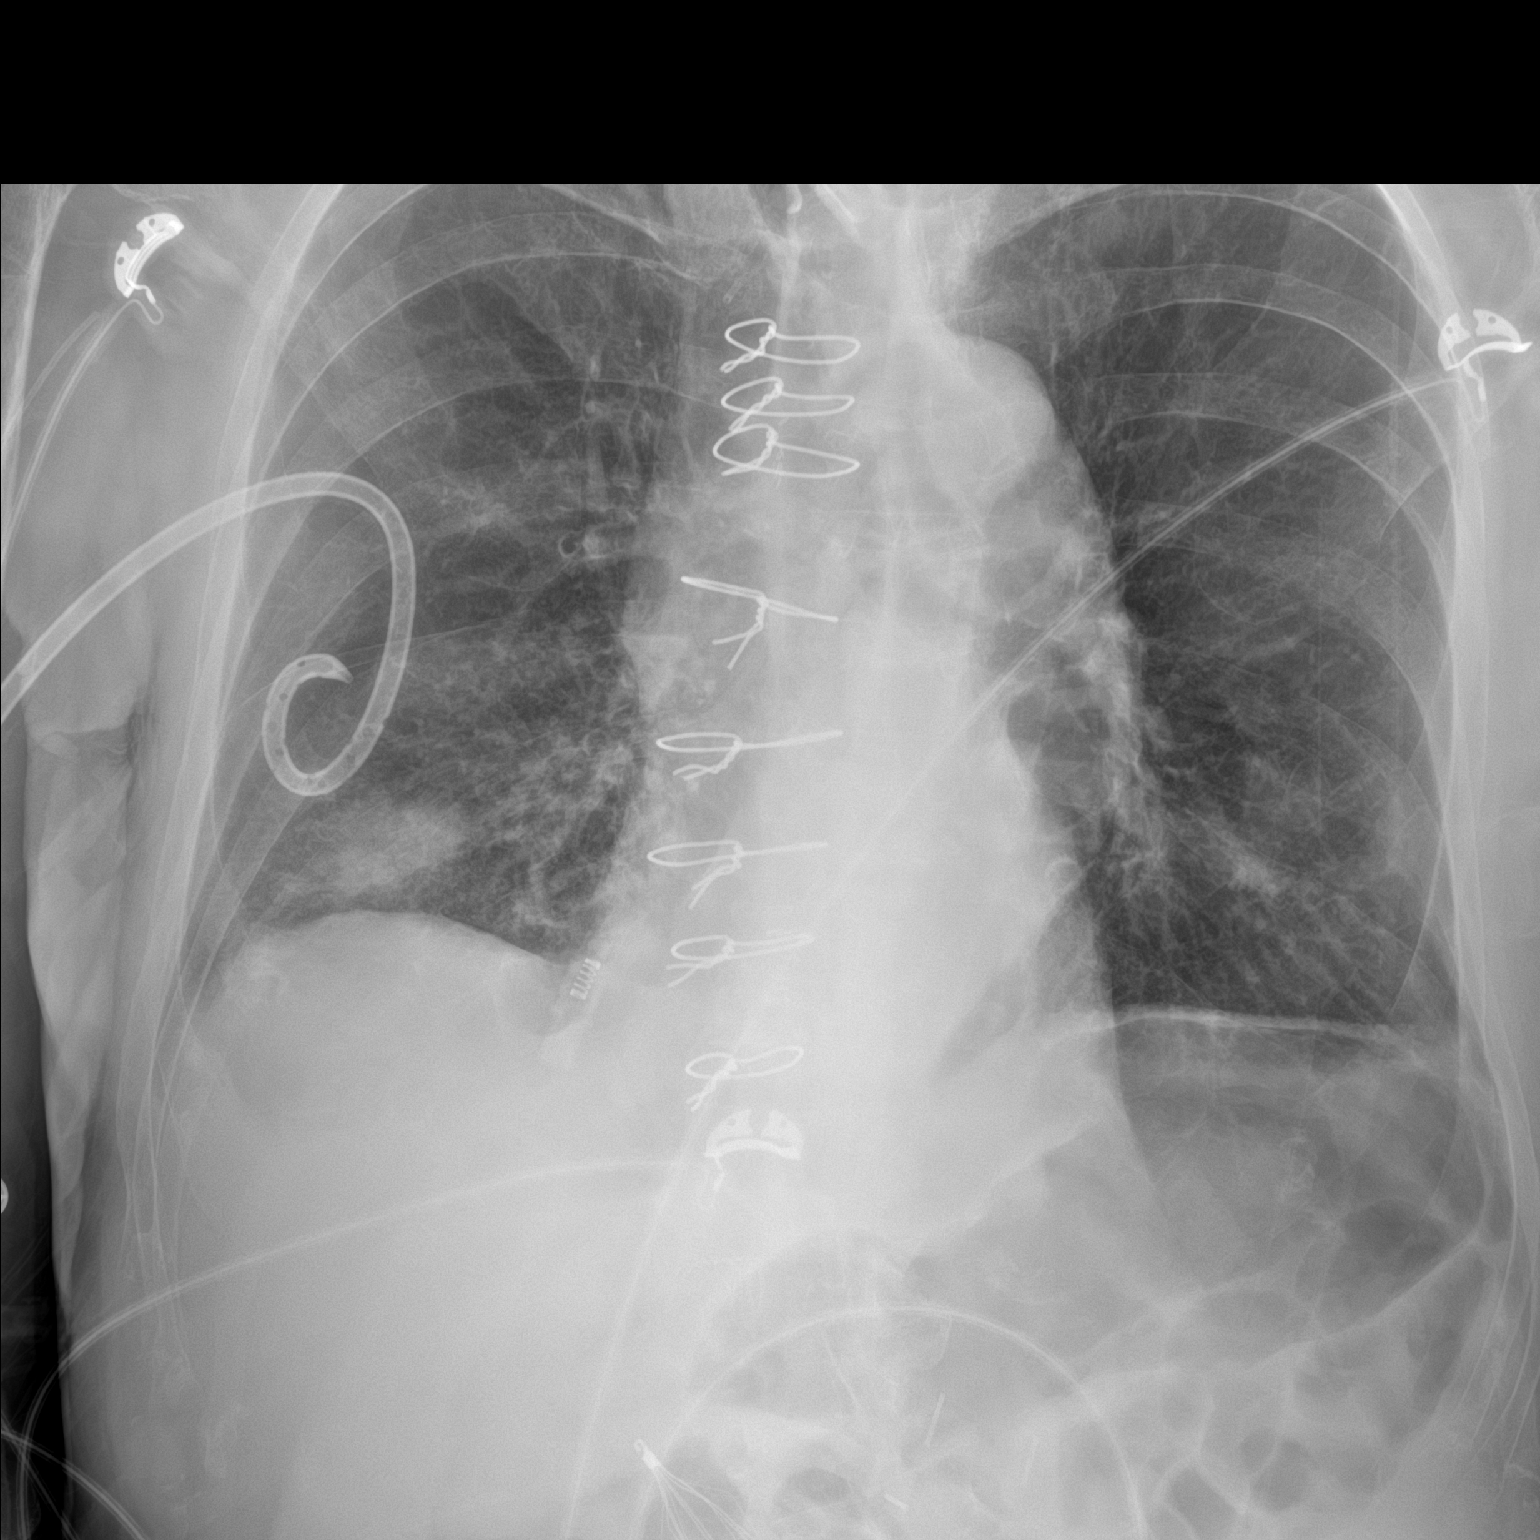

[chest ap (2 of 2)]
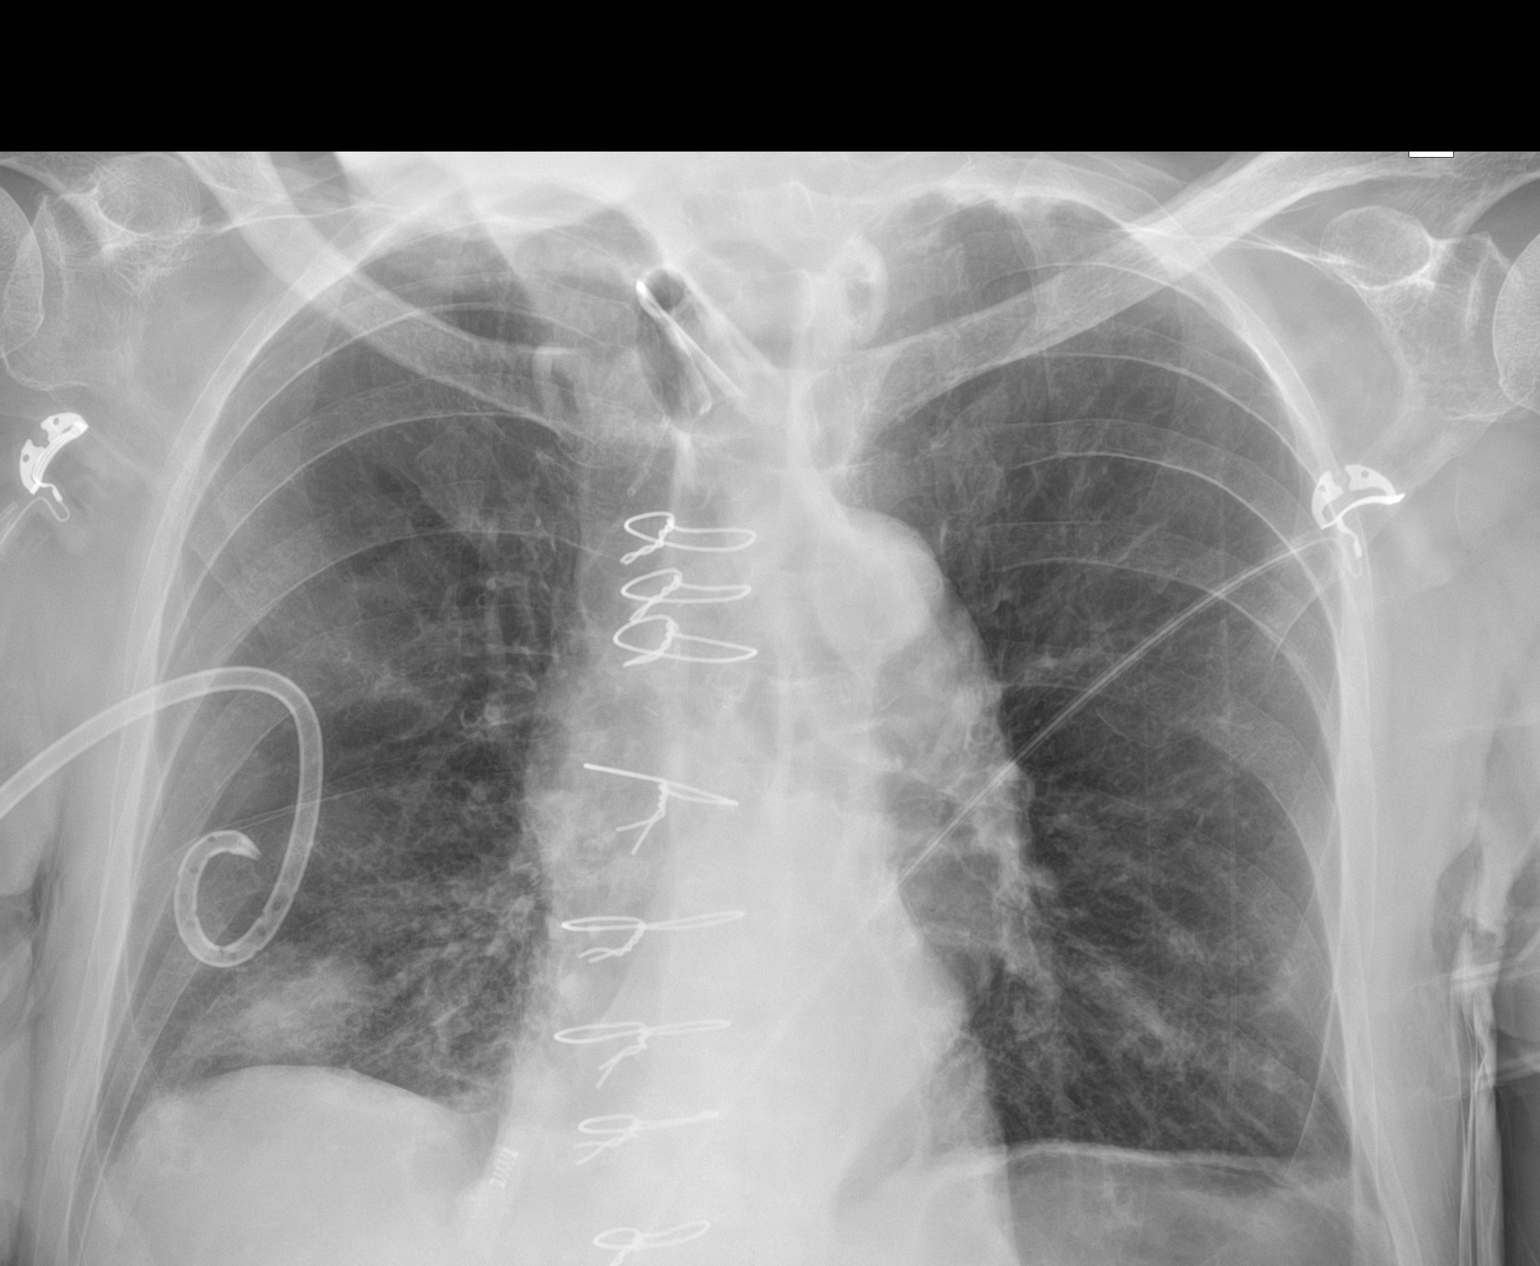

[2 of 2 positions shown; findings below may reference images not displayed]

FINDINGS: 2 frontal views of the chest demonstrate repositioning of the
pigtail drainage catheter, overlying the lateral right lung base.
There is a trace residual right apical pneumothorax, decreased in
size since prior study with pleural separation measuring less than 2
mm. Volume estimated far less than 5%. Rounded consolidation at the
right lung base, with overall improved aeration since prior study.
No evidence of pleural effusion. Stable tracheostomy tube.
IMPRESSION: 1. Further decrease in the right apical pneumothorax seen
previously, with pleural separation now measuring less than 2 mm.
2. Improving aeration at the right lung base, with residual rounded
consolidation possibly representing atelectasis.

## 2021-04-11 IMAGING — DX DG CHEST 1V PORT
1 series · 1 of 1 positions shown · non-contrast
Comparison: AP chest [DATE] and [DATE]

CLINICAL DATA: Oxygen desaturation.

EXAM:
PORTABLE CHEST 1 VIEW

[chest]
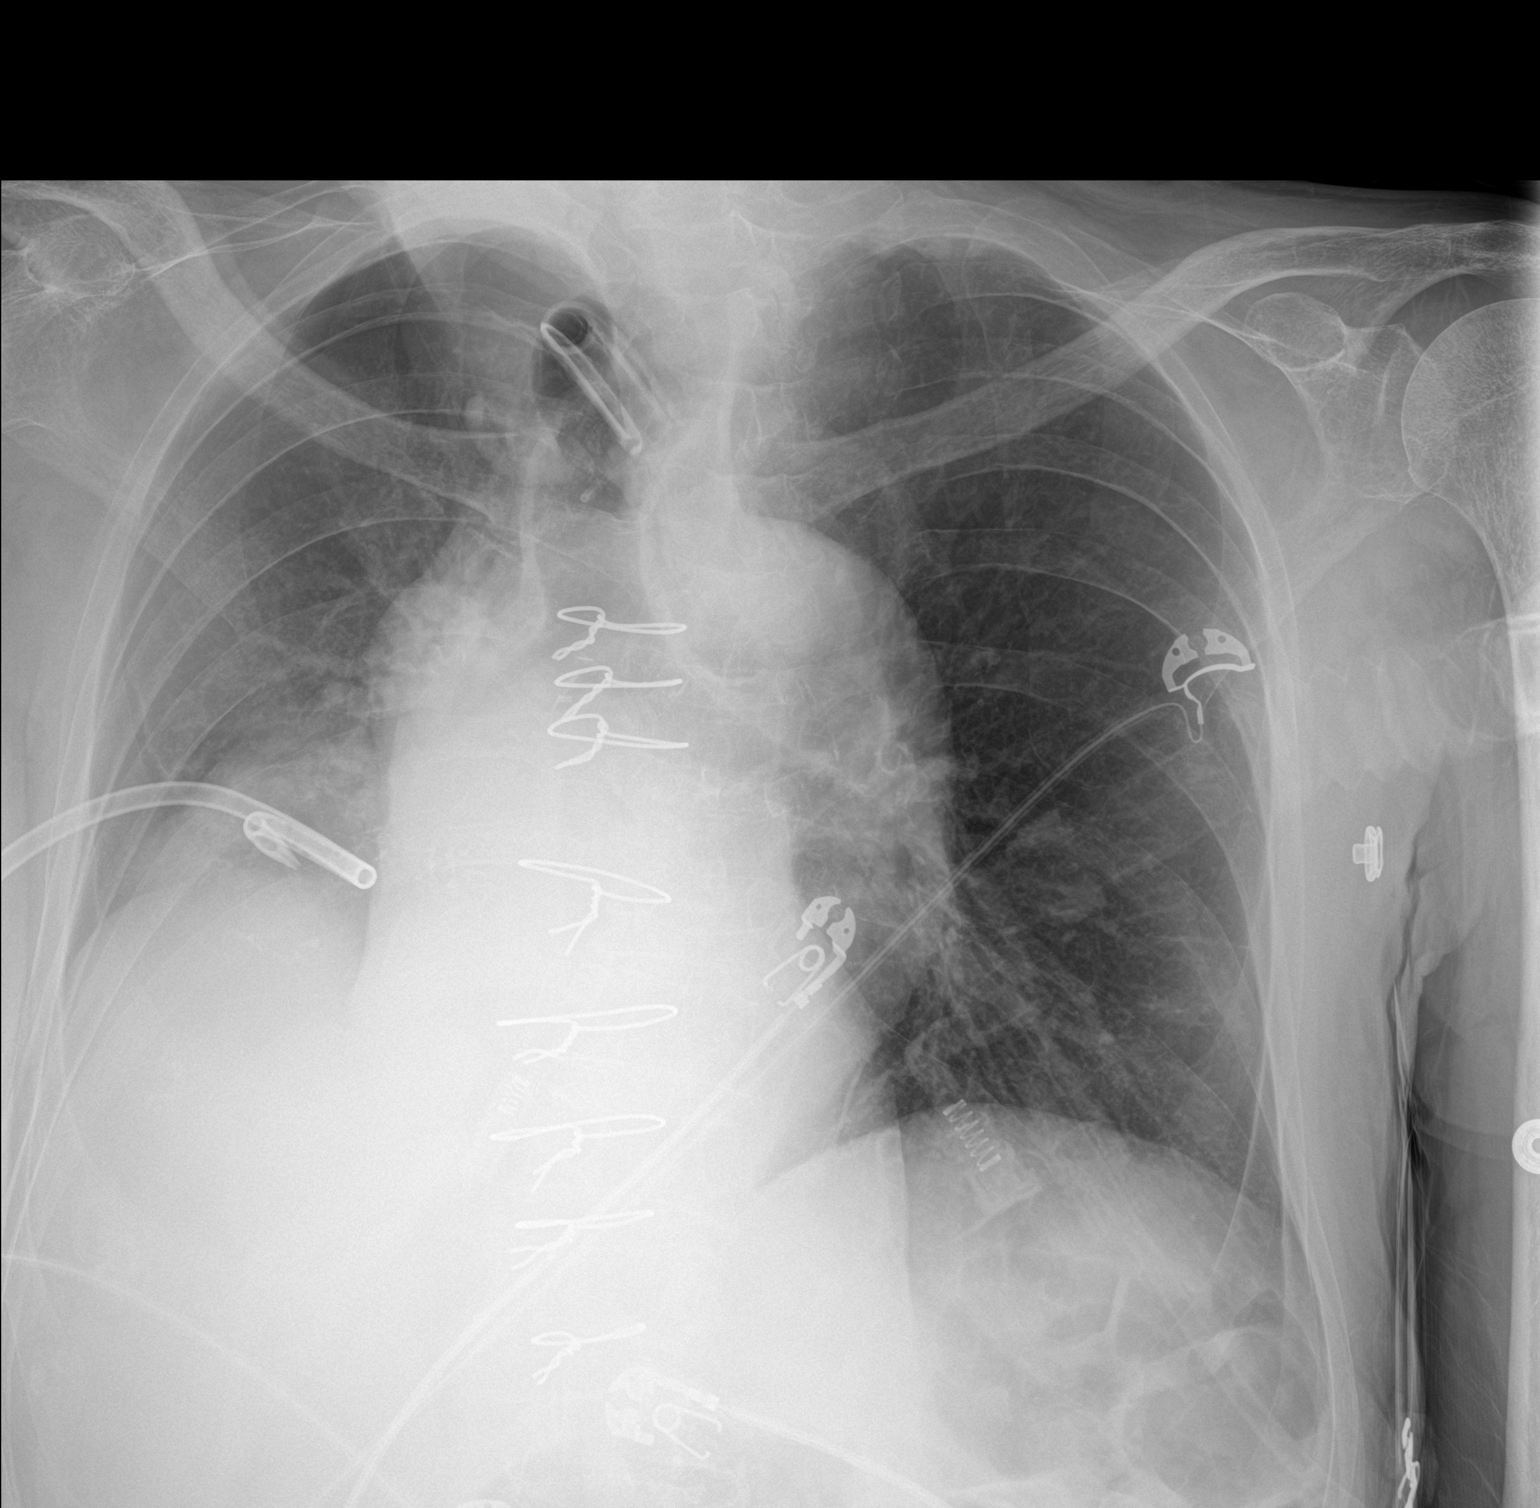

[1 of 1 positions shown; findings below may reference images not displayed]

FINDINGS: Interval placement of inferior right hemithorax pigtail chest tube
with interval improvement in the previously seen right apical
pneumothorax. This measures up to approximately 12 mm in
craniocaudal dimension compared to 30 mm previously.

Tracheostomy tube overlies the midline trachea. The patient is
mildly rightward rotated. Status post median sternotomy. Heart size
is not well assessed but grossly unchanged and likely within normal
limits. Tortuous thoracic aorta is again noted. Moderate elevation
of the right hemidiaphragm with right basilar linear likely
subsegmental atelectasis in the region of the chest tube. The left
lung is clear.

No acute skeletal abnormality.
IMPRESSION: Interval placement of right lower hemithorax chest tube and interval
improvement in the previously seen right apical pneumothorax, now
small.

## 2021-04-11 MED ORDER — PANTOPRAZOLE SODIUM 40 MG PO PACK: 80.0000 mg | PACK | Freq: Every day | ORAL | Status: DC

## 2021-04-11 MED ORDER — PANTOPRAZOLE 2 MG/ML SUSPENSION
40.0000 mg | Freq: Every day | ORAL | Status: DC
Start: 2021-04-11 — End: 2021-04-16
  Administered 2021-04-12 – 2021-04-16 (×5): 40 mg
  Filled 2021-04-11 (×6): qty 20

## 2021-04-11 MED ORDER — INSULIN ASPART 100 UNIT/ML IJ SOLN
0.0000 [IU] | INTRAMUSCULAR | Status: DC
  Administered 2021-04-11: 2 [IU] via SUBCUTANEOUS
  Administered 2021-04-12 – 2021-04-15 (×6): 1 [IU] via SUBCUTANEOUS

## 2021-04-11 MED ORDER — POLYETHYLENE GLYCOL 3350 17 G PO PACK: 17.0000 g | PACK | Freq: Every day | ORAL | Status: DC | PRN

## 2021-04-11 MED ORDER — LORATADINE 10 MG PO TABS
10.0000 mg | ORAL_TABLET | Freq: Every day | ORAL | Status: DC
  Administered 2021-04-12 – 2021-04-16 (×5): 10 mg
  Filled 2021-04-11 (×5): qty 1

## 2021-04-11 MED ORDER — FENTANYL CITRATE (PF) 100 MCG/2ML IJ SOLN
INTRAMUSCULAR | Status: AC
Start: 2021-04-11 — End: 2021-04-11
  Administered 2021-04-11: 100 ug
  Filled 2021-04-11: qty 2

## 2021-04-11 MED ORDER — DOCUSATE SODIUM 50 MG/5ML PO LIQD: 100.0000 mg | Freq: Two times a day (BID) | ORAL | Status: DC | PRN

## 2021-04-11 MED ORDER — ORAL CARE MOUTH RINSE
15.0000 mL | OROMUCOSAL | Status: DC
  Administered 2021-04-11 – 2021-04-16 (×34): 15 mL via OROMUCOSAL

## 2021-04-11 MED ORDER — SODIUM CHLORIDE 0.9 % IV BOLUS
500.0000 mL | Freq: Once | INTRAVENOUS | Status: AC
  Administered 2021-04-11: 500 mL via INTRAVENOUS

## 2021-04-11 MED ORDER — ALBUTEROL SULFATE (2.5 MG/3ML) 0.083% IN NEBU
2.5000 mg | INHALATION_SOLUTION | Freq: Four times a day (QID) | RESPIRATORY_TRACT | Status: DC | PRN
  Administered 2021-04-12 – 2021-04-15 (×3): 2.5 mg via RESPIRATORY_TRACT
  Filled 2021-04-11 (×4): qty 3

## 2021-04-11 MED ORDER — LORAZEPAM 2 MG/ML IJ SOLN
1.0000 mg | Freq: Once | INTRAMUSCULAR | Status: AC
  Administered 2021-04-11: 1 mg via INTRAVENOUS
  Filled 2021-04-11: qty 1

## 2021-04-11 MED ORDER — HYDROXYZINE HCL 25 MG PO TABS
25.0000 mg | ORAL_TABLET | Freq: Four times a day (QID) | ORAL | Status: DC | PRN
  Administered 2021-04-12 – 2021-04-16 (×4): 25 mg
  Filled 2021-04-11 (×4): qty 1

## 2021-04-11 MED ORDER — FUROSEMIDE 40 MG PO TABS
20.0000 mg | ORAL_TABLET | Freq: Every day | ORAL | Status: DC
  Administered 2021-04-12 – 2021-04-14 (×3): 20 mg
  Filled 2021-04-11 (×3): qty 1

## 2021-04-11 MED ORDER — SUCCINYLCHOLINE CHLORIDE 200 MG/10ML IV SOSY
PREFILLED_SYRINGE | INTRAVENOUS | Status: DC
Start: 2021-04-11 — End: 2021-04-11
  Filled 2021-04-11: qty 10

## 2021-04-11 MED ORDER — LINEZOLID 600 MG/300ML IV SOLN
600.0000 mg | Freq: Two times a day (BID) | INTRAVENOUS | Status: DC
  Administered 2021-04-11 – 2021-04-12 (×2): 600 mg via INTRAVENOUS
  Filled 2021-04-11 (×2): qty 300

## 2021-04-11 MED ORDER — MIDODRINE HCL 5 MG PO TABS
5.0000 mg | ORAL_TABLET | Freq: Three times a day (TID) | ORAL | Status: DC
  Administered 2021-04-12: 5 mg via ORAL
  Filled 2021-04-11: qty 1

## 2021-04-11 MED ORDER — ETOMIDATE 2 MG/ML IV SOLN
INTRAVENOUS | Status: AC
Start: 2021-04-11 — End: 2021-04-11
  Administered 2021-04-11: 20 mg
  Filled 2021-04-11: qty 20

## 2021-04-11 MED ORDER — MIDAZOLAM HCL 2 MG/2ML IJ SOLN
INTRAMUSCULAR | Status: AC
  Filled 2021-04-11: qty 2

## 2021-04-11 MED ORDER — ESCITALOPRAM OXALATE 10 MG PO TABS
10.0000 mg | ORAL_TABLET | Freq: Every day | ORAL | Status: DC
  Administered 2021-04-12 – 2021-04-16 (×5): 10 mg
  Filled 2021-04-11 (×5): qty 1

## 2021-04-11 MED ORDER — LEVOTHYROXINE SODIUM 25 MCG PO TABS
25.0000 ug | ORAL_TABLET | Freq: Every day | ORAL | Status: DC
  Administered 2021-04-12 – 2021-04-16 (×5): 25 ug
  Filled 2021-04-11 (×5): qty 1

## 2021-04-11 MED ORDER — IPRATROPIUM-ALBUTEROL 0.5-2.5 (3) MG/3ML IN SOLN
3.0000 mL | Freq: Four times a day (QID) | RESPIRATORY_TRACT | Status: DC | PRN
  Administered 2021-04-11 – 2021-04-15 (×5): 3 mL via RESPIRATORY_TRACT
  Filled 2021-04-11 (×5): qty 3

## 2021-04-11 MED ORDER — SODIUM CHLORIDE 0.9 % IV SOLN
2.0000 g | Freq: Three times a day (TID) | INTRAVENOUS | Status: DC
Start: 2021-04-11 — End: 2021-04-11
  Administered 2021-04-11: 2 g via INTRAVENOUS
  Filled 2021-04-11: qty 2

## 2021-04-11 MED ORDER — CHLORHEXIDINE GLUCONATE 0.12% ORAL RINSE (MEDLINE KIT)
15.0000 mL | Freq: Two times a day (BID) | OROMUCOSAL | Status: DC
  Administered 2021-04-11 – 2021-04-16 (×9): 15 mL via OROMUCOSAL

## 2021-04-11 MED ORDER — TAMSULOSIN HCL 0.4 MG PO CAPS: 0.4000 mg | ORAL_CAPSULE | Freq: Every day | ORAL | Status: DC

## 2021-04-11 MED ORDER — MORPHINE SULFATE (PF) 2 MG/ML IV SOLN
2.0000 mg | INTRAVENOUS | Status: DC | PRN
  Administered 2021-04-11 – 2021-04-15 (×7): 2 mg via INTRAVENOUS
  Filled 2021-04-11 (×7): qty 1

## 2021-04-11 MED ORDER — METOPROLOL TARTRATE 12.5 MG HALF TABLET
12.5000 mg | ORAL_TABLET | Freq: Two times a day (BID) | ORAL | Status: DC
  Administered 2021-04-12 – 2021-04-13 (×2): 12.5 mg
  Filled 2021-04-11 (×4): qty 1

## 2021-04-11 MED ORDER — ENOXAPARIN SODIUM 40 MG/0.4ML IJ SOSY
40.0000 mg | PREFILLED_SYRINGE | INTRAMUSCULAR | Status: DC
  Administered 2021-04-11 – 2021-04-15 (×5): 40 mg via SUBCUTANEOUS
  Filled 2021-04-11 (×5): qty 0.4

## 2021-04-11 MED ORDER — ESOMEPRAZOLE MAGNESIUM 40 MG PO PACK: 40.0000 mg | PACK | Freq: Every day | ORAL | Status: DC

## 2021-04-11 MED ORDER — KETAMINE HCL 50 MG/5ML IJ SOSY
PREFILLED_SYRINGE | INTRAMUSCULAR | Status: AC
  Filled 2021-04-11: qty 5

## 2021-04-11 MED ORDER — SODIUM CHLORIDE 0.9 % IV SOLN
2.0000 g | Freq: Three times a day (TID) | INTRAVENOUS | Status: DC
  Administered 2021-04-11 – 2021-04-12 (×2): 2 g via INTRAVENOUS
  Filled 2021-04-11 (×2): qty 2

## 2021-04-11 MED ORDER — ALPRAZOLAM 0.5 MG PO TABS
0.5000 mg | ORAL_TABLET | Freq: Three times a day (TID) | ORAL | Status: DC | PRN
  Administered 2021-04-11 – 2021-04-16 (×11): 0.5 mg
  Filled 2021-04-11 (×12): qty 1

## 2021-04-11 MED ORDER — DOCUSATE SODIUM 100 MG PO CAPS: 100.0000 mg | ORAL_CAPSULE | Freq: Two times a day (BID) | ORAL | Status: DC | PRN

## 2021-04-11 MED ORDER — SODIUM CHLORIDE 0.9 % IV SOLN: INTRAVENOUS | Status: AC

## 2021-04-11 MED ORDER — ROCURONIUM BROMIDE 10 MG/ML (PF) SYRINGE
PREFILLED_SYRINGE | INTRAVENOUS | Status: AC
  Filled 2021-04-11: qty 10

## 2021-04-11 NOTE — Progress Notes (Signed)
Called emergently to bedside for inability to oxygenate.  Pigtail not kinked, no air leak.  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  These were suctioned, O2 needs improved.  BAL sent from RLL.  CXR ordered, will watch closely. ? ?Erskine Emery MD PCCM ? ? ?

## 2021-04-11 NOTE — ED Triage Notes (Signed)
Pt BIB [facility] from [facility] d/t staff finding him appearing grey & desaturating, it was reported that his spO2 was 34%. He was placed back on the ventilator & is now oxygenating & mentation back to baseline. [facility] reports they had him at 97% while 30% FiO2 on the truck en route. Vent settings were reported as follows: 450 tidal, 16 rate, Peep 5. PICC in his Rt upper arm, Foley in place, & a G-tube present/clamped. Pt uses chalk board to write, follows commands.  ?

## 2021-04-11 NOTE — Progress Notes (Signed)
[facility]Link Physician-Brief Progress Note ?Patient Name: Austin Davis ?DOB: [birth year] ?MRN: [id number] ? ? ?Date of Service ? 04/11/2021  ?HPI/Events of Note ? Pt appears uncomfortable, likely related to chest tubes  ?eICU Interventions ? Placed order for low dose PRN morphine. Will follow response.   ? ? ? ?  ? ?[place] ?04/11/2021, 7:33 PM ?

## 2021-04-11 NOTE — Procedures (Signed)
Insertion of Chest Tube Procedure Note ? ?PAULMICHAEL SCHRECK  ?[id number]  ?Jun 15, 1940 ? ?Date:04/11/21  ?Time:3:04 PM  ? ? ?Provider Performing: Otilio Carpen Leanne Sisler  ? ?Procedure: Chest Tube Insertion (00349) ? ?Indication(s) ?Pneumothorax ? ?Consent ?Risks of the procedure as well as the alternatives and risks of each were explained to the patient and/or caregiver.  Consent for the procedure was obtained and is signed in the bedside chart ? ?Anesthesia ?Topical only with 1% lidocaine  ? ? ?Time Out ?Verified patient identification, verified procedure, site/side was marked, verified correct patient position, special equipment/implants available, medications/allergies/relevant history reviewed, required imaging and test results available. ? ? ?Sterile Technique ?Maximal sterile technique including full sterile barrier drape, hand hygiene, sterile gown, sterile gloves, mask, hair covering, sterile ultrasound probe cover (if used). ? ? ?Procedure Description ?Ultrasound used to identify appropriate pleural anatomy for placement and overlying skin marked. Area of placement cleaned and draped in sterile fashion.  A 14 French pigtail pleural catheter was placed into the right pleural space using Seldinger technique. Appropriate return of air was obtained.  The tube was connected to atrium and placed on -20 cm H2O wall suction. ? ? ?Complications/Tolerance ?None; patient tolerated the procedure well. ?Chest X-ray is ordered to verify placement. ? ? ?EBL ?Minimal ? ?Specimen(s) ?none ? ? ?Otilio Carpen Madylyn [place], PA-C ? ?

## 2021-04-11 NOTE — Progress Notes (Addendum)
Pharmacy Antibiotic Note ? ?Austin Davis is a 81 y.o. male admitted on 04/11/2021 with bacteremia.  Pharmacy has been consulted for cefepime dosing. Presents with acute on chronic respiratory failure with trach/vent dependence, right pneumothorax, and hypotension. Pateint presented from [place]. Per chart review, patient was not actively receiving antibiotics at [facility]. ? ?WBC is 8.8, patient is afebrile, BP is elevated and respiratory rate is now 14 s/p chest tube placement. Using last weight charted, Unadjusted CrCl is 100 mL/min and CrCl with Scr rounded to 0.8 is ~60 mL/min. Will start standard cefepime dosing and adjust if renal function worsens. Urine culture in early March grew VRE and pseudomonas. ? ?Plan: ?Start cefepime 2g every 8 hours ?Linezolid per ID ?Follow up cultures and clinical progress ?  ? ?Temp (24hrs), Avg:98.1 ?F (36.7 ?C), Min:98.1 ?F (36.7 ?C), Max:98.1 ?F (36.7 ?C) ? ?Recent Labs  ?Lab 04/11/21 ?1118 04/11/21 ?1546  ?WBC 10.8* 8.8  ?CREATININE 0.52*  --   ?LATICACIDVEN 1.3  --   ?  ?CrCl cannot be calculated (Unknown ideal weight.).   ? ?Allergies  ?Allergen Reactions  ? Prednisone Other (See Comments)  ?  Makes skin crawl, rapid HR  ? Cortisone Palpitations  ? ? ?Antimicrobials this admission: ?Cefepime 3/23 >>  ?Linezolid 3/23 >> ? ?Dose adjustments this admission: ?none ? ?Microbiology results: ?3/23 BCx: IP ? ?Thank you for allowing pharmacy to participate in this patient's care. ? ?Reatha Harps, PharmD ?PGY1 Pharmacy Resident ?04/11/2021 4:24 PM ?Check [email or web address] for unit specific pharmacy number ? ? ?

## 2021-04-11 NOTE — Procedures (Signed)
Bronchoscopy Procedure Note ? ?KYSER WANDEL  ?[id number]  ?[birth year] ? ?Date:04/11/21  ?Time:5:40 PM  ? ?Provider Performing:Georgenia Salim C Tamala Julian  ? ?Procedure(s):  Flexible bronchoscopy with bronchial alveolar lavage (75436) and Initial Therapeutic Aspiration of Tracheobronchial Tree (06770) ? ?Indication(s) ?Inability to oxygenate ? ?Consent ?Risks of the procedure as well as the alternatives and risks of each were explained to the patient and/or caregiver.  Consent for the procedure was obtained and is signed in the bedside chart ? ?Anesthesia ?Etomidate 14m x 1 ? ? ?Time Out ?Verified patient identification, verified procedure, site/side was marked, verified correct patient position, special equipment/implants available, medications/allergies/relevant history reviewed, required imaging and test results available. ? ? ?Sterile Technique ?Usual hand hygiene, masks, gowns, and gloves were used ? ? ?Procedure Description ?Bronchoscope advanced through endotracheal tube and into airway.  ? ?Findings:  ?- Complete occlusion of left mainstem from mucus plug, suctioned, airways distal open to subsegmental level ?- Complete occlusion of bronchus intermedius, suctioned, RML seemed open, RLL had ongoing copious mucopurulent secretions worst in RLL lateral subsegment, BAL sent from this area  ? ? ?Complications/Tolerance ?None; patient tolerated the procedure well. ?Chest X-ray is not needed post procedure. ? ? ?EBL ?Minimal ? ? ?Specimen(s) ?BAL RLL ? ?

## 2021-04-11 NOTE — H&P (Signed)
? ?NAME:  Austin Davis, MRN:  [id number], DOB:  August 10, 1940, LOS: 0 ?ADMISSION DATE:  04/11/2021, CONSULTATION DATE:  04/11/21 ?REFERRING MD:  Rogene Houston - EM, CHIEF COMPLAINT:  ptx  ? 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  ?Recently had presented to the ED 3/6, 3/7 for hypoxia and likely aspiration PNA and was found to have bacteremia: Pseudomonas aeruginosa and VRE enterococcus faecium  ? ? ?PCCM called for evaluation in this setting, pt denies CP, nods when asked if it feels hard to breathe. ? ?Pertinent  Medical History  ? ?Chronic respiratory failure  ?Trach/vent dependence  ?DVT ?CHF ?Dysphagia ?Afib ?FTT ?Sacral ulcer ?Hypothyroidism ?Significant Hospital Events: ?Including procedures, antibiotic start and stop dates in addition to other pertinent events   ?3/23 to ED from [place] for ptx. Admit to PCCM ? ?Interim History / Subjective:  ?On POCUS, concern for expanding ptx, chest tube placed ? ?Objective   ?Blood pressure 109/60, pulse 93, temperature 98.1 ?F (36.7 ?C), temperature source Oral, resp. rate (!) 28, SpO2 99 %. ?   ?Vent Mode: PRVC ?FiO2 (%):  [40 %] 40 % ?Set Rate:  [16 bmp] 16 bmp ?Vt Set:  [450 mL] 450 mL ?PEEP:  [5 cmH20] 5 cmH20 ?Plateau Pressure:  [14 cmH20] 14 cmH20  ?No intake or output data in the 24 hours ending 04/11/21 1426 ?There were no vitals filed for this visit. ? ?General:  thin, poorly nourished, chronically ill-appearing M in no acute distress ?HEENT: MM pink/moist, trach in place, sclera anicteric  ?Neuro: awake, trying to communicate, intermittently difficult to understand speech but appears oriented ?CV: s1s2 , no m/r/g ?PULM:  decreased air entry RUL, on full vent support, no wheezing or rhonchi ?GI/GU: soft, bsx4  active, indwelling foley ?Extremities: warm/dry, no edema  ?Skin: no rashes or lesions ? ? ?Resolved Hospital Problem list   ? ? ?Assessment & Plan:  ? ?Acute on  chronic respiratory failure with hypoxia and chronic hypercarbia ?Chronic trach / vent dependence  ?COPD  ?Right sided ptx  ?P ?-chest tube in ED ?-admit to ICU  ?-cont home vent settings, routine trach care ?-can try to wean vent as tolerated, from [facility] notes sounds like has failed CPAP weans ? ?Pseudomonas Aeruginosa bacteremia ?VRE enterococcus faecium bacteremia ?-positive cx resulted from 3/6 ED presentation. Is not clear from [facility] notes if/what this has been tx with ?P ?-repeaet cx  ?-does not appear acutely infected ? ?Chronic hypotension ?P ?-midodrine ? ?BPH ?Neurogenic bladder ?P ?-tamulosin  ?-exchange indwelling  ? ?Failure to thrive in adult ?Protein calorie malnutrition ?P ?-EN per RDN  ?-I see in 09/21/2020 in a palliative care note that patient's code status is Partial Code -- meds and BiPAP only (but then had a trach placed in 10/2020) and a MOST form was completed per palliative note. There does not seem to be a MOST form in his packet from [facility] ?-pt confirmed he does not want to be resuscitated if his heart stops, will enter DNR ? ? ?Best Practice (right click and "Reselect all SmartList Selections" daily)  ? ?Diet/type: tubefeeds ?DVT prophylaxis: LMWH ?GI prophylaxis: PPI ?Lines: N/A ?Foley:  Yes, and it is still needed ?Code Status:  DNR ?Last date of multidisciplinary goals of care discussion [pending] ? ?  Labs   ?CBC: ?Recent Labs  ?Lab 04/11/21 ?1118  ?WBC 10.8*  ?NEUTROABS 8.5*  ?HGB 11.6*  ?HCT 37.2*  ?MCV 91.0  ?PLT 291  ? ? ?Basic Metabolic Panel: ?Recent Labs  ?Lab 04/11/21 ?1118  ?NA 140  ?K 4.2  ?CL 97*  ?CO2 34*  ?GLUCOSE 127*  ?BUN 31*  ?CREATININE 0.52*  ?CALCIUM 9.4  ? ?GFR: ?CrCl cannot be calculated (Unknown ideal weight.). ?Recent Labs  ?Lab 04/11/21 ?1118  ?WBC 10.8*  ?LATICACIDVEN 1.3  ? ? ?Liver Function  Tests: ?Recent Labs  ?Lab 04/11/21 ?1118  ?AST 24  ?ALT 27  ?ALKPHOS 132*  ?BILITOT 0.4  ?PROT 6.6  ?ALBUMIN 3.1*  ? ?No results for input(s): LIPASE, AMYLASE in the last 168 hours. ?No results for input(s): AMMONIA in the last 168 hours. ? ?ABG ?   ?Component Value Date/Time  ? PHART 7.388 01/14/2021 1626  ? PCO2ART 49.9 (H) 01/14/2021 1626  ? PO2ART 108 01/14/2021 1626  ? HCO3 38.3 (H) 03/25/2021 0354  ? TCO2 40 (H) 03/25/2021 0354  ? O2SAT 99 03/25/2021 0354  ?  ? ?Coagulation Profile: ?No results for input(s): INR, PROTIME in the last 168 hours. ? ?Cardiac Enzymes: ?No results for input(s): CKTOTAL, CKMB, CKMBINDEX, TROPONINI in the last 168 hours. ? ?HbA1C: ?Hgb A1c MFr Bld  ?Date/Time Value Ref Range Status  ?10/02/2020 04:46 AM 6.1 (H) 4.8 - 5.6 % Final  ?  Comment:  ?  (NOTE) ?Pre diabetes:          5.7%-6.4% ? ?Diabetes:              >6.4% ? ?Glycemic control for   <7.0% ?adults with diabetes ?  ?01/30/2015 11:15 AM 5.9 (H) <5.7 % Final  ?  Comment:  ?                                                                         ?According to the ADA Clinical Practice Recommendations for 2011, when ?HbA1c is used as a screening test: ?  ?  >=6.5%   Diagnostic of Diabetes Mellitus ?           (if abnormal result is confirmed) ?  ?5.7-6.4%   Increased risk of developing Diabetes Mellitus ?  ?References:Diagnosis and Classification of Diabetes Mellitus,Diabetes ?IPJA,2505,39(JQBHA 1):S62-S69 and Standards of Medical Care in         ?Diabetes - 2011,Diabetes Care,2011,34 (Suppl 1):S11-S61. ?  ?  ? ? ?CBG: ?No results for input(s): GLUCAP in the last 168 hours. ? ?Review of Systems:   ?Please see the history of present illness. All other systems reviewed and are negative  ? ? ?Past Medical History:  ?He,  has a past medical history of CHF (congestive heart failure) ([place]), Coronary artery disease, GI bleed (2019), Headache, Low blood sugar, Mitral regurgitation, Mitral valve prolapse, Osteopenia (2021), and Paroxysmal  atrial fibrillation ([place]).  ? ?Surgical History:  ? ?Past Surgical History:  ?Procedure Laterality Date  ? COLONOSCOPY WITH PROPOFOL N/A 02/19/2017  ? Procedure: COLONOSCOPY WITH PROPOFOL;  Surgeon: Wilford Corner, MD;  Location: [facility];  Service: Endoscopy;  Laterality: N/A;  ? IR GASTROSTOMY TUBE MOD SED  10/18/2020  ? IR IVC FILTER PLMT / S&I /  IMG GUID/MOD SED  11/01/2020  ? laser eye surgery for retina detachment    ? MITRAL VALVE REPAIR  01/2003  ? monitor  02/05/2006  ? polyp removal    ? TONSILLECTOMY    ? tooth removal    ? as a teenager  ? TRACHEOSTOMY TUBE PLACEMENT N/A 10/26/2020  ? Procedure: TRACHEOSTOMY;  Surgeon: Rozetta Nunnery, MD;  Location: [place];  Service: ENT;  Laterality: N/A;  ?  ? ?Social History:  ? reports that he has never smoked. He has never used smokeless tobacco. He reports current alcohol use. He reports that he does not use drugs.  ? ?Family History:  ?His family history includes Asthma in his daughter; CAD in his mother; Epilepsy in his son; Heart disease in his mother; Heart failure in his father, maternal grandmother, and paternal grandmother; Migraines in his mother; Pneumonia in his maternal grandfather; Skin cancer in his father; Stroke in his paternal grandfather; Tremor in his father; Valvular heart disease in his father. There is no history of Parkinson's disease.  ? ?Allergies ?Allergies  ?Allergen Reactions  ? Prednisone Other (See Comments)  ?  Makes skin crawl, rapid HR  ? Cortisone Palpitations  ?  ? ?Home Medications  ?Prior to Admission medications   ?Medication Sig Start Date End Date Taking? Authorizing Provider  ?acetaminophen (TYLENOL) 650 MG CR tablet Take 650 mg by mouth every 8 (eight) hours as needed for pain. Via tube   Yes [provider]  ?ALPRAZolam Duanne Moron) 0.5 MG tablet Place 0.5 mg into feeding tube every 8 (eight) hours as needed for anxiety.   Yes [provider]  ?Amino Acids-Protein Hydrolys (FEEDING SUPPLEMENT, PRO-STAT  SUGAR FREE 64,) LIQD Place 30 mLs into feeding tube 2 (two) times daily.   Yes [provider]  ?Cholecalciferol (VITAMIN D3 SUPER STRENGTH) 50 MCG (2000 UT) TABS Give 2,000 Units by tube daily.

## 2021-04-11 NOTE — Progress Notes (Signed)
Pt transported on vent from 020 to 2M02 without any complications. RN at bedside, RT will continue to monitor.  ?

## 2021-04-11 NOTE — ED Provider Notes (Addendum)
[facility] EMERGENCY DEPARTMENT Provider Note   CSN: [id number] Arrival date & time: 04/11/21  1046     History  Chief Complaint  Patient presents with   Desaturation    Austin Davis is a 81 y.o. male.  Patient sent in from [facility] by [facility].  Some confusion about why he was transferred but I think it is because they did a chest x-ray there this morning that showed a new moderate-sized right pneumothorax.  There was some questions of a tension pneumothorax.  Patient has been vent dependent however they have been working on weaning him from the vent.  They take him off the vent for certain periods of time during the day and he was off the vent he got hypoxic and they put him back on the vent he is back to baseline.  Patient also seen March 6 and 7 here in the emergency department for aspiration pneumonia.  Was treated with antibiotics.  His medication list currently shows no evidence of any antibiotics currently.  Patient has a Foley patient has a G-tube.  Patient not febrile respiratory rate is up some at 20-24 heart rate is around 90 oxygen saturations 100% blood pressure is 102/81.  Patient's medication list is significant for midodrine  5 mg 3 times daily.      Home Medications Prior to Admission medications   Medication Sig Start Date End Date Taking? Authorizing Provider  acetaminophen (TYLENOL) 650 MG CR tablet Take 650 mg by mouth every 8 (eight) hours as needed for pain. Via tube   Yes [provider]  ALPRAZolam Prudy Feeler) 0.5 MG tablet Place 0.5 mg into feeding tube every 8 (eight) hours as needed for anxiety.   Yes [provider]  Amino Acids-Protein Hydrolys (FEEDING SUPPLEMENT, PRO-STAT SUGAR FREE 64,) LIQD Place 30 mLs into feeding tube 2 (two) times daily.   Yes [provider]  Cholecalciferol (VITAMIN D3 SUPER STRENGTH) 50 MCG (2000 UT) TABS Give 2,000 Units by tube daily.   Yes [provider]  clonazePAM  (KLONOPIN) 0.5 MG tablet Place 0.5 mg into feeding tube 2 (two) times daily.   Yes [provider]  Docosahexaenoic Acid (DHA OMEGA 3 PO) Place 1 capsule into feeding tube daily.   Yes [provider]  escitalopram (LEXAPRO) 10 MG tablet Place 10 mg into feeding tube daily.   Yes [provider]  esomeprazole (NEXIUM) 40 MG packet Take 40 mg by mouth daily. Per G tube   Yes [provider]  fexofenadine (ALLEGRA) 180 MG tablet Place 180 mg into feeding tube daily.   Yes [provider]  folic acid (FOLVITE) 1 MG tablet Take 1 tablet (1 mg total) by mouth daily. Patient taking differently: Place 1 mg into feeding tube daily. 09/29/20  Yes Azucena Fallen, MD  furosemide (LASIX) 20 MG tablet Place 20 mg into feeding tube daily.   Yes [provider]  guaiFENesin (ROBITUSSIN) 100 MG/5ML liquid Place 20 mLs into feeding tube 3 (three) times daily.   Yes [provider]  hydrOXYzine (ATARAX) 25 MG tablet Place 25 mg into feeding tube every 6 (six) hours as needed for anxiety.   Yes [provider]  ipratropium-albuterol (DUONEB) 0.5-2.5 (3) MG/3ML SOLN Take 3 mLs by nebulization every 6 (six) hours as needed (shortness of breath).   Yes [provider]  Lactobacillus Rhamnosus, GG, (CULTURELLE) CAPS Give 10 Billion Cells by tube daily.   Yes [provider]  lactulose (CHRONULAC) 10 GM/15ML solution Place 20 g into feeding tube 2 (two) times daily as needed for mild constipation.   Yes [provider]  levalbuterol (XOPENEX) 1.25 MG/3ML nebulizer solution Take 1.25 mg by nebulization every 6 (six) hours. For 30 days starting on 03-23-21   Yes [provider]  levothyroxine (SYNTHROID) 25 MCG tablet Place 25 mcg into feeding tube daily before breakfast.   Yes [provider]  metoprolol tartrate (LOPRESSOR) 25 MG tablet Place 12.5 mg into feeding tube 2 (two) times daily.   Yes [provider]  midodrine (PROAMATINE) 5 MG tablet Take 1 tablet (5 mg total) by mouth 3 (three) times daily with meals. Patient taking differently: Place 5 mg into feeding tube 3 (three) times daily. 09/28/20  Yes Azucena Fallen, MD  Multiple Vitamin (MULTIVITAMIN WITH MINERALS) TABS tablet Take 1 tablet by mouth daily. Patient taking differently: Place 1 tablet into feeding tube daily. 09/29/20  Yes Azucena Fallen, MD  Nutritional Supplements (JEVITY 1.5 CAL PO) Give 50 mLs by tube in the morning and at bedtime.   Yes [provider]  phenylephrine (SUDAFED PE) 10 MG TABS tablet 10 mg daily as needed (congestion). Via tube   Yes [provider]  polyethylene glycol powder (GLYCOLAX/MIRALAX) 17 GM/SCOOP powder Place 17 g into feeding tube every other day.   Yes [provider]  sodium chloride (OCEAN) 0.65 % SOLN nasal spray Place 1 spray into both nostrils every 4 (four) hours as needed for congestion.   Yes [provider]  Sodium Chloride Flush (NORMAL SALINE FLUSH IV) Inject 90 mg into the vein 4 (four) times daily as needed (before and after medications).   Yes [provider]  tamsulosin (FLOMAX) 0.4 MG CAPS capsule 0.4 mg at bedtime. Via tube   Yes [provider]  vitamin C (ASCORBIC ACID) 500 MG tablet Place 500 mg into feeding tube daily.   Yes [provider]  zinc gluconate 50 MG tablet Place 50 mg into feeding tube 2 (two) times daily.   Yes [provider]      Allergies    Prednisone and Cortisone    Review of Systems   Review of Systems  Unable to perform ROS: Patient nonverbal  All other systems reviewed and are negative.  Physical Exam Updated Vital Signs BP 91/61   Pulse 89   Temp 98.1 F (36.7 C) (Oral)   Resp (!) 26   SpO2 97%  Physical Exam Vitals and nursing note reviewed.  Constitutional:      General: He is not in acute distress.    Appearance: He is well-developed.  HENT:      Head: Normocephalic and atraumatic.  Eyes:     Extraocular Movements: Extraocular movements intact.     Conjunctiva/sclera: Conjunctivae normal.     Pupils: Pupils are equal, round, and reactive to light.  Cardiovascular:     Rate and Rhythm: Normal rate and regular rhythm.     Heart sounds: No murmur heard. Pulmonary:     Effort: Pulmonary effort is normal. No respiratory distress.     Breath sounds: Normal breath sounds.  Abdominal:     Palpations: Abdomen is soft.     Tenderness: There is no abdominal tenderness.  Musculoskeletal:        General: No swelling.     Cervical back: Neck supple.  Skin:    General: Skin is warm and dry.     Capillary Refill:  Capillary refill takes less than 2 seconds.  Neurological:     Mental Status: He is alert.  Psychiatric:        Mood and Affect: Mood normal.    ED Results / Procedures / Treatments   Labs (all labs ordered are listed, but only abnormal results are displayed) Labs Reviewed  COMPREHENSIVE METABOLIC PANEL - Abnormal; Notable for the following components:      Result Value   Chloride 97 (*)    CO2 34 (*)    Glucose, Bld 127 (*)    BUN 31 (*)    Creatinine, Ser 0.52 (*)    Albumin 3.1 (*)    Alkaline Phosphatase 132 (*)    All other components within normal limits  CBC WITH DIFFERENTIAL/PLATELET - Abnormal; Notable for the following components:   WBC 10.8 (*)    RBC 4.09 (*)    Hemoglobin 11.6 (*)    HCT 37.2 (*)    RDW 17.2 (*)    Neutro Abs 8.5 (*)    Eosinophils Absolute 0.6 (*)    All other components within normal limits  CULTURE, BLOOD (ROUTINE X 2)  CULTURE, BLOOD (ROUTINE X 2)  LACTIC ACID, PLASMA    EKG None  Radiology DG Chest Port 1 View  Result Date: 04/11/2021 CLINICAL DATA:  Pneumonia Pneumothorax? Desaturating EXAM: PORTABLE CHEST - 1 VIEW COMPARISON:  03/26/2021 FINDINGS: Cardiomediastinal silhouette and pulmonary vasculature are within normal limits. Postop changes median sternotomy again  seen. Interval increase of right medial lung base opacity suspicious for atelectasis of the right middle lobe. Moderate size right pneumothorax is now present. Tracheostomy cannula in appropriate position. IMPRESSION: 1. Moderate sized right pneumothorax. 2. Worsening atelectasis of right middle lobe. Electronically Signed   By: Acquanetta Belling M.D.   On: 04/11/2021 12:15    Procedures Procedures    Medications Ordered in ED Medications  0.9 %  sodium chloride infusion ( Intravenous New Bag/Given 04/11/21 1138)  sodium chloride 0.9 % bolus 500 mL (0 mLs Intravenous Stopped 04/11/21 1302)    ED Course/ Medical Decision Making/ A&P                           Medical Decision Making Amount and/or Complexity of Data Reviewed Labs: ordered. Radiology: ordered.  Risk Prescription drug management.  Patient sent over from [facility] presumably for a right-sided pneumothorax.  Repeat chest x-ray here does show a moderate right-sided pneumothorax.  No evidence of tension pneumothorax.  Discussed with critical care.  Was trying to determine whether [facility] would be able to manage a chest tube.  Also was asking whether critical care would place it since patient is on the vent with the patient need to be admitted here or whether cardiothoracic would do that.  Critical care implied that [facility] would be able to manage the chest tube.  I said to ask cardiothoracic to place it if they are not willing that to call them back and they can place chest tube.  Complete metabolic panel is 6 significant for chloride of 97 glucose is good at 127.  Liver function test are normal except for alk phos is of elevated at 132 renal function GFR is greater than 60.  Complete metabolic panel white count 10.8.  And hemoglobin 11.6.  The chest x-ray showed a moderate size right pneumothorax worsening atelectasis right middle lobe.  Cardiothoracic surgery did not want to do the chest tube because they cannot follow the  patient at  [facility].  They want to follow any chest tubes that they put in.  We will recontact critical care.  Critical care will come see the patient.  There also been a call over to [facility] and confirmed that they can manage the chest tube there.  Critical care will place the chest tube.  Final Clinical Impression(s) / ED Diagnoses Final diagnoses:  Primary spontaneous pneumothorax  Ventilator dependent [facility])    Rx / DC Orders ED Discharge Orders     None         Vanetta Mulders, MD 04/11/21 1304    Vanetta Mulders, MD 04/11/21 1325    Vanetta Mulders, MD 04/11/21 1327

## 2021-04-11 NOTE — ED Notes (Signed)
ICU at bedside to put in Chest tube.  ?

## 2021-04-12 ENCOUNTER — Inpatient Hospital Stay (HOSPITAL_COMMUNITY): Payer: Medicare Other

## 2021-04-12 LAB — POCT I-STAT 7, (LYTES, BLD GAS, ICA,H+H)
Acid-Base Excess: 6 mmol/L — ABNORMAL HIGH (ref 0.0–2.0)
Bicarbonate: 32.2 mmol/L — ABNORMAL HIGH (ref 20.0–28.0)
Calcium, Ion: 1.3 mmol/L (ref 1.15–1.40)
HCT: 30 % — ABNORMAL LOW (ref 39.0–52.0)
Hemoglobin: 10.2 g/dL — ABNORMAL LOW (ref 13.0–17.0)
O2 Saturation: 85 %
Patient temperature: 98
Potassium: 3.9 mmol/L (ref 3.5–5.1)
Sodium: 138 mmol/L (ref 135–145)
TCO2: 34 mmol/L — ABNORMAL HIGH (ref 22–32)
pCO2 arterial: 52.7 mmHg — ABNORMAL HIGH (ref 32–48)
pH, Arterial: 7.393 (ref 7.35–7.45)
pO2, Arterial: 51 mmHg — ABNORMAL LOW (ref 83–108)

## 2021-04-12 LAB — BASIC METABOLIC PANEL
Anion gap: 7 (ref 5–15)
BUN: 25 mg/dL — ABNORMAL HIGH (ref 8–23)
CO2: 29 mmol/L (ref 22–32)
Calcium: 9.2 mg/dL (ref 8.9–10.3)
Chloride: 102 mmol/L (ref 98–111)
Creatinine, Ser: 0.54 mg/dL — ABNORMAL LOW (ref 0.61–1.24)
GFR, Estimated: >60 mL/min (ref >60)
Glucose, Bld: 132 mg/dL — ABNORMAL HIGH (ref 70–99)
Potassium: 3.9 mmol/L (ref 3.5–5.1)
Sodium: 138 mmol/L (ref 135–145)

## 2021-04-12 LAB — GLUCOSE, CAPILLARY
Glucose-Capillary: 91 mg/dL (ref 70–99)
Glucose-Capillary: 127 mg/dL — ABNORMAL HIGH (ref 70–99)
Glucose-Capillary: 90 mg/dL (ref 70–99)
Glucose-Capillary: 99 mg/dL (ref 70–99)
Glucose-Capillary: 111 mg/dL — ABNORMAL HIGH (ref 70–99)
Glucose-Capillary: 119 mg/dL — ABNORMAL HIGH (ref 70–99)

## 2021-04-12 LAB — CBC
HCT: 32.1 % — ABNORMAL LOW (ref 39.0–52.0)
Hemoglobin: 10.1 g/dL — ABNORMAL LOW (ref 13.0–17.0)
MCH: 28.6 pg (ref 26.0–34.0)
MCHC: 31.5 g/dL (ref 30.0–36.0)
MCV: 90.9 fL (ref 80.0–100.0)
Platelets: 226 10*3/uL (ref 150–400)
RBC: 3.53 MIL/uL — ABNORMAL LOW (ref 4.22–5.81)
RDW: 17.2 % — ABNORMAL HIGH (ref 11.5–15.5)
WBC: 10.1 10*3/uL (ref 4.0–10.5)
nRBC: 0 % (ref 0.0–0.2)

## 2021-04-12 LAB — PHOSPHORUS: Phosphorus: 4 mg/dL (ref 2.5–4.6)

## 2021-04-12 LAB — MAGNESIUM: Magnesium: 1.8 mg/dL (ref 1.7–2.4)

## 2021-04-12 IMAGING — DX DG CHEST 1V PORT
1 series · 1 of 1 positions shown · non-contrast
Comparison: Earlier the same day

CLINICAL DATA: 8-year-old male with desaturation, history of
pneumonia.

EXAM:
PORTABLE CHEST - 1 VIEW

[chest ap]
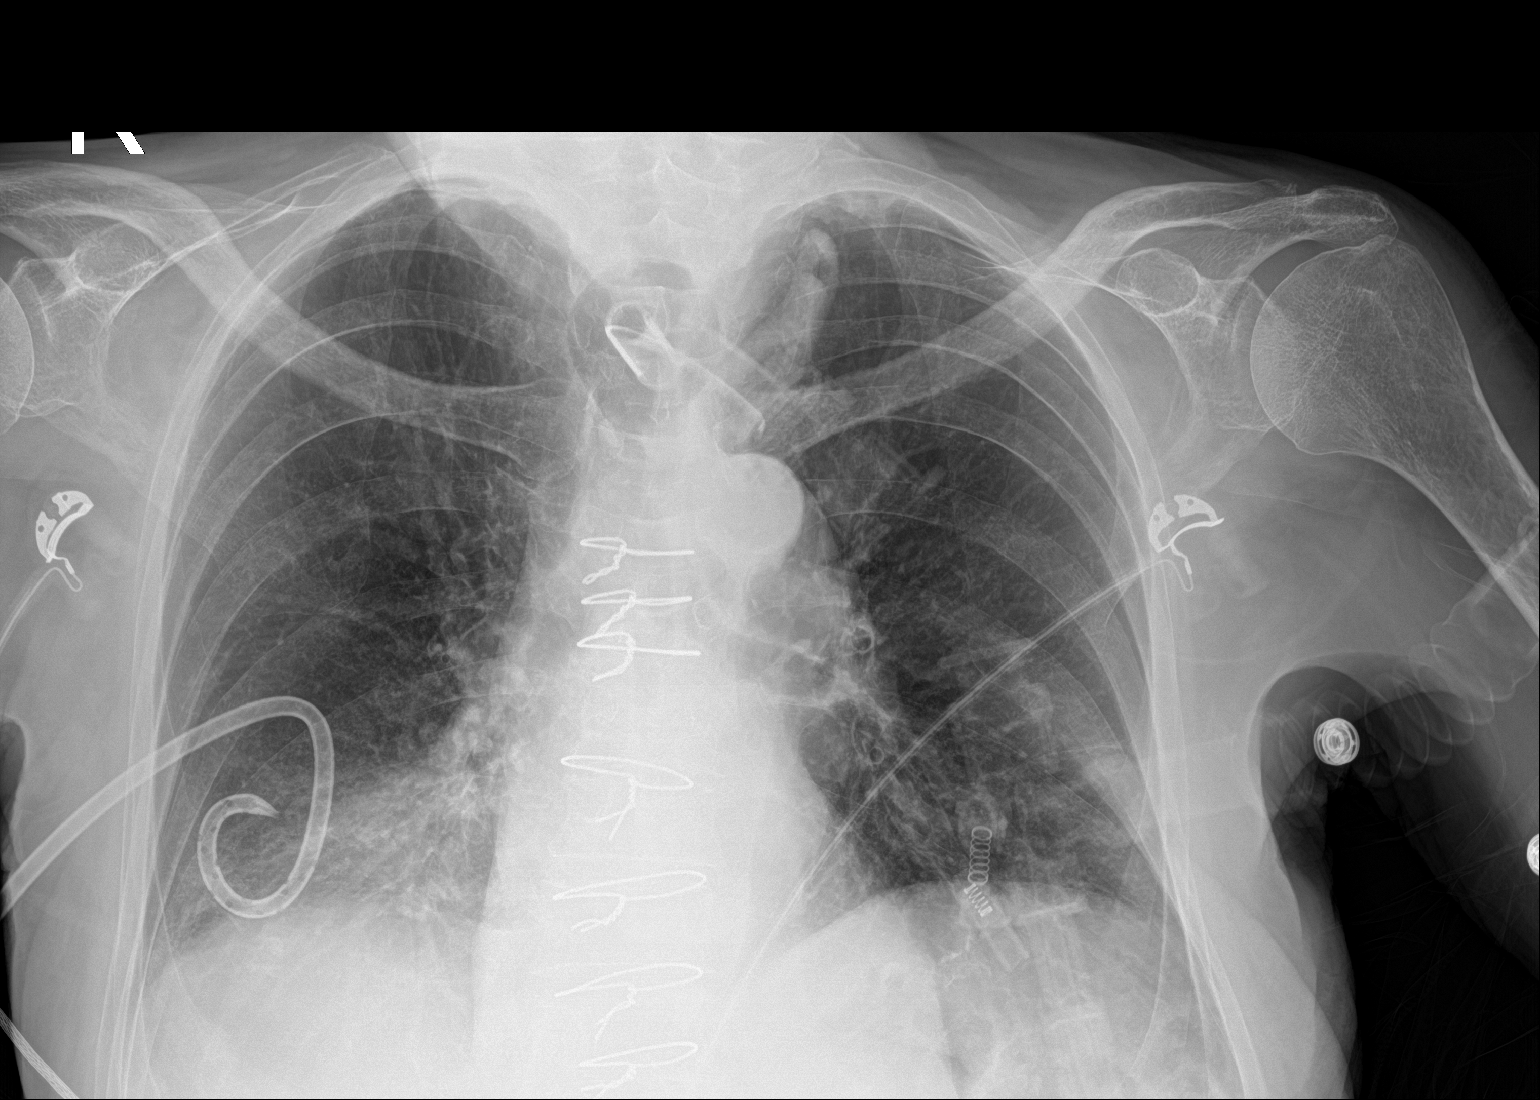

[1 of 1 positions shown; findings below may reference images not displayed]

FINDINGS: Apical projection. The mediastinal silhouette is partially obscured
due to technical factors, however appears similar to comparison.
Tracheostomy cannula is in place. Right pigtail thoracostomy tube
remains in place projecting over the basal right hemithorax. Similar
appearing hazy opacities in the right lung base, slightly more
radiopaque than comparison, though likely secondary to technical
factors given differences in technique. No new focal consolidations.
No evidence of significant pleural effusion or evidence of
pneumothorax No acute osseous abnormality.
IMPRESSION: 1. Similar appearing right basilar opacity in keeping with history
of pneumonia. No new acute findings to explain desaturation.
2. Unchanged position of right pigtail thoracostomy tube and
tracheostomy cannula that evidence of pneumothorax or significant
pleural effusion.

## 2021-04-12 IMAGING — DX DG CHEST 1V PORT
1 series · 1 of 1 positions shown · non-contrast
Comparison: Chest x-ray [DATE].

CLINICAL DATA: 80-year-old male with history of pneumothorax.

EXAM:
PORTABLE CHEST 1 VIEW

[chest ap]
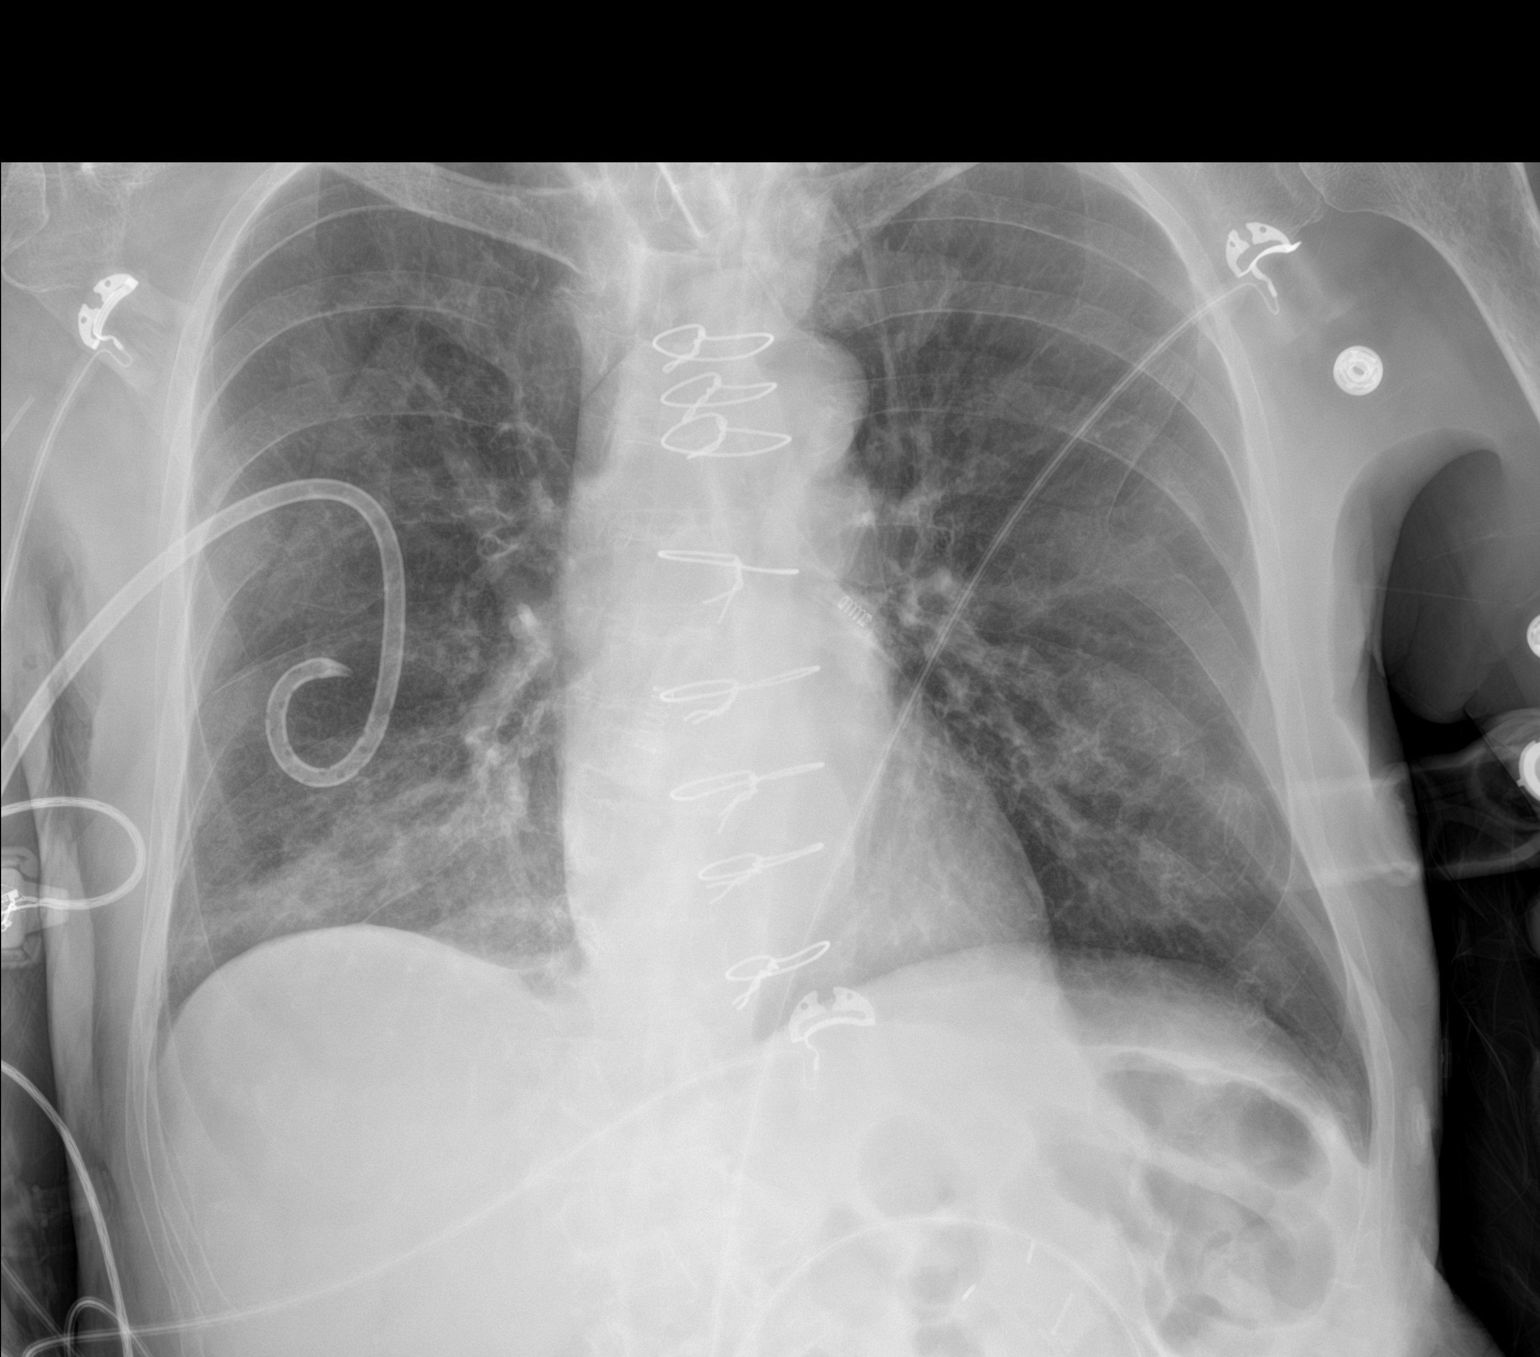

[1 of 1 positions shown; findings below may reference images not displayed]

FINDINGS: A tracheostomy tube is in place with tip 4.5 cm above the carina.
Right sided small bore chest tube in position with pigtail reformed
over the right mid to lower hemithorax. Trace residual right-sided
pneumothorax. Patchy airspace consolidation is noted, most evident
in the right lung base, increased compared to the prior study,
presumably in the right lower lobe (though difficult to localize
given the lack of obscuration of the right heart border or right
hemidiaphragm). Some patchy consolidation is also noted to a lesser
extent in the left lower lobe. No pleural effusions. No evidence of
pulmonary edema. Heart size is normal. Upper mediastinal contours
are within normal limits. Atherosclerotic calcifications are noted
in the thoracic aorta. Status post median sternotomy.
IMPRESSION: 1. Postoperative changes and support apparatus, as above.
2. Trace residual right pneumothorax.
3. Worsening airspace consolidation, particularly in the right lower
lobe.

## 2021-04-12 MED ORDER — CLONAZEPAM 0.5 MG PO TABS
0.5000 mg | ORAL_TABLET | Freq: Two times a day (BID) | ORAL | Status: DC
  Administered 2021-04-12 – 2021-04-16 (×9): 0.5 mg
  Filled 2021-04-12 (×9): qty 1

## 2021-04-12 MED ORDER — PROSOURCE TF PO LIQD
45.0000 mL | Freq: Two times a day (BID) | ORAL | Status: DC
  Administered 2021-04-12 – 2021-04-16 (×9): 45 mL
  Filled 2021-04-12 (×9): qty 45

## 2021-04-12 MED ORDER — JEVITY 1.5 CAL/FIBER PO LIQD
1000.0000 mL | ORAL | Status: DC
  Administered 2021-04-12 – 2021-04-13 (×2): 1000 mL
  Filled 2021-04-12 (×10): qty 1000

## 2021-04-12 MED ORDER — ADULT MULTIVITAMIN W/MINERALS CH
1.0000 | ORAL_TABLET | Freq: Every day | ORAL | Status: DC
  Administered 2021-04-12 – 2021-04-16 (×5): 1
  Filled 2021-04-12 (×5): qty 1

## 2021-04-12 MED ORDER — CHLORHEXIDINE GLUCONATE CLOTH 2 % EX PADS
6.0000 | MEDICATED_PAD | Freq: Every day | CUTANEOUS | Status: DC
  Administered 2021-04-12 – 2021-04-16 (×5): 6 via TOPICAL

## 2021-04-12 MED ORDER — MIDODRINE HCL 5 MG PO TABS
5.0000 mg | ORAL_TABLET | Freq: Three times a day (TID) | ORAL | Status: DC
  Administered 2021-04-12 – 2021-04-16 (×13): 5 mg
  Filled 2021-04-12 (×13): qty 1

## 2021-04-12 MED ORDER — DOXAZOSIN MESYLATE 2 MG PO TABS
2.0000 mg | ORAL_TABLET | Freq: Every day | ORAL | Status: DC
Start: 2021-04-12 — End: 2021-04-16
  Administered 2021-04-12 – 2021-04-16 (×5): 2 mg
  Filled 2021-04-12 (×5): qty 1

## 2021-04-12 NOTE — Progress Notes (Signed)
eLink Physician-Brief Progress Note ?Patient Name: Austin Davis ?DOB: 23-Oct-1940 ?MRN: [id number] ? ? ?Date of Service ? 04/12/2021  ?HPI/Events of Note ? Camera : ?Discussed with daughter [name]   ?Answered all her quiries about Dx, care plan for now and future. ?[name] is fully alert. ? ?Want to be a full code. ACLS-shock, pressors if needed. Already on trach, s/p bronch. ?Not on abx. Cultures so far no growth.   ?eICU Interventions ? Code status reversed to Full code per patient and his daughter request, updated bed side RN same.   ? ? ? ?Intervention Category ?Intermediate Interventions: Other: (code status change) ? ?Elmer Sow ?04/12/2021, 9:05 PM ?

## 2021-04-12 NOTE — TOC Progression Note (Signed)
Transition of Care (TOC) - Progression Note  ? ? ?Patient Details  ?Name: SAURABH HETTICH ?MRN: [id number] ?Date of Birth: 18-Mar-1940 ? ?Transition of Care (TOC) CM/SW Contact  ?Angelita Ingles, RN ?Phone Number:[phone] ? ?04/12/2021, 2:09 PM ? ?Clinical Narrative:    ? ? ?Transition of Care (TOC) Screening Note ? ? ?Patient Details  ?Name: RUVIM RISKO ?Date of Birth: 01-13-1941 ? ? ?Transition of Care (TOC) CM/SW Contact:    ?Angelita Ingles, RN ?Phone Number:[phone] ? ?04/12/2021, 2:09 PM ? ? ? ?Transition of Care Department [facility]) has reviewed patient and no TOC needs have been identified at this time. TOC acknowledges that patient is currently in ICU and needs may not be identified at this time. We will continue to monitor patient advancement through interdisciplinary progression rounds.  ? ?  ?  ? ?Expected Discharge Plan and Services ?  ?  ?  ?  ?  ?                ?  ?  ?  ?  ?  ?  ?  ?  ?  ?  ? ? ?Social Determinants of Health (SDOH) Interventions ?  ? ?Readmission Risk Interventions ?   ? View : No data to display.  ?  ?  ?  ? ? ?

## 2021-04-12 NOTE — Progress Notes (Signed)
? ?NAME:  Austin Davis, MRN:  [id number], DOB:  [birth year], LOS: 1 ?ADMISSION DATE:  04/11/2021, CONSULTATION DATE:  04/11/21 ?REFERRING MD:  Rogene Houston - EM, CHIEF COMPLAINT:  ptx  ? 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  ?Recently had presented to the ED 3/6, 3/7 for hypoxia and likely aspiration PNA and was found to have bacteremia: Pseudomonas aeruginosa and VRE enterococcus faecium  ? ?PCCM called for evaluation in this setting, pt denies CP, nods when asked if it feels hard to breathe. ? ?Pertinent  Medical History  ? ?Chronic respiratory failure  ?Trach/vent dependence  ?DVT ?CHF ?Dysphagia ?Afib ?FTT ?Sacral ulcer ?Hypothyroidism ?Significant Hospital Events: ?Including procedures, antibiotic start and stop dates in addition to other pertinent events   ?3/23 to ED from [place] for ptx. Admit to PCCM ?3/23 Chest tube placement for pneumothorax ? ? ?Interim History / Subjective:  ?Patient nods head in response to questions. And attempts to speak intermittently. Nods head "yes" to some chest discomfort. ? ?Objective   ?Blood pressure 96/67, pulse 82, temperature 98 ?F (36.7 ?C), temperature source Oral, resp. rate (!) 21, weight 58.3 kg, SpO2 100 %. ?   ?Vent Mode: PRVC ?FiO2 (%):  [40 %-80 %] 40 % ?Set Rate:  [16 bmp] 16 bmp ?Vt Set:  [450 mL] 450 mL ?PEEP:  [5 cmH20-10 cmH20] 8 cmH20 ?Plateau Pressure:  [14 cmH20-20 cmH20] 16 cmH20  ? ?Intake/Output Summary (Last 24 hours) at 04/12/2021 8546 ?Last data filed at 04/12/2021 0400 ?Gross per 24 hour  ?Intake 1636.6 ml  ?Output 840 ml  ?Net 796.6 ml  ? ?Filed Weights  ? 04/12/21 0500  ?Weight: 58.3 kg  ? ? ?General:  thin, poorly nourished, chronically ill-appearing M in no acute distress ?HEENT: MM  pink/moist, trach in place, sclera anicteric  ?CHEST: chest tube placement RUQ, clean and dry ?Neuro: awake, trying to communicate, intermittently difficult to understand speech but appears oriented ?CV: s1s2 , no m/r/g;  ?PULM: full vent support, no wheezing or rhonchi ?GI/GU: soft, bsx4 active, indwelling foley ?Extremities: warm/dry, no edema  ?Skin: no rashes or lesions ? ? ?Resolved Hospital Problem list   ? ? ?Assessment & Plan:  ? ?Acute on  chronic respiratory failure with hypoxia and chronic hypercarbia ?Chronic trach / vent dependence  ?COPD  ?Bronchoscopy 3/23 findings complete occlusion of left mainstem from mucus plug, complete occlusion of bronchus intermedius, RLL had ongoing copious mucopurulent secretions worst in RLL lateral subsegment. All areas suctioned. BAL of RLL sent for testing.  ?-CXR concerning for positioning of trach, advise RT for Bivona. ?-Repeat CXR this afternoon ?-resp clx pending; no organisms seen on gram stain ?-chest tube, no air leak, clamp chest tube today ?-cont home vent settings, routine trach care ?-can try to wean vent as tolerated, per [facility] notes, pt failed CPAP weans ? ?Pseudomonas Aeruginosa bacteruria ?VRE enterococcus faecium bacteruria ?-positive urine cx resulted from 3/6 ED presentation. Unclear if treated at previous facility. Patient does not appear infectious. Vitals stable; afebrile, no leukocytosis noted on CBC ?-blood clx 3/23 no growth to date ?- discontinue IV cefepime 2g Q8H and IV linezolid 659m Q12H  ?-monitor for signs of infection ? ?Chronic hypotension ?Stable; MAPs >70 ?-continue midodrine ? ?BPH ?-voiding trial today ?-tamulosin .461m?-  monitor I/Os ? ?Failure to thrive in adult ?Protein calorie malnutrition ?-EN per RDN  ?-09/21/2020 palliative care note that patient's code status is Partial Code -- meds and BiPAP only (but then had a trach placed in 10/2020) and a MOST form was completed per palliative note. There does not seem to be a MOST form  in his packet from [facility] ?-pt confirmed he does not want to be resuscitated if his heart stops, DNR ? ? ?Best Practice (right click and "Reselect all SmartList Selections" daily)  ? ?Diet/type: tubefeeds ?DVT prophylaxis: LMWH ?GI prophylaxis: PPI ?Lines: N/A ?Foley:  Yes, and it is still needed ?Code Status:  DNR ?Last date of multidisciplinary goals of care discussion [pending] ? ?Labs   ?CBC: ?Recent Labs  ?Lab 04/11/21 ?1118 04/11/21 ?1546 04/12/21 ?9935 04/12/21 ?0755  ?WBC 10.8* 8.8  --  10.1  ?NEUTROABS 8.5*  --   --   --   ?HGB 11.6* 11.0* 10.2* 10.1*  ?HCT 37.2* 34.4* 30.0* 32.1*  ?MCV 91.0 90.5  --  90.9  ?PLT 291 230  --  226  ? ? ?Basic Metabolic Panel: ?Recent Labs  ?Lab 04/11/21 ?1118 04/11/21 ?1546 04/12/21 ?7017 04/12/21 ?0755  ?NA 140  --  138 138  ?K 4.2  --  3.9 3.9  ?CL 97*  --   --  102  ?CO2 34*  --   --  29  ?GLUCOSE 127*  --   --  132*  ?BUN 31*  --   --  25*  ?CREATININE 0.52* 0.54*  --  0.54*  ?CALCIUM 9.4  --   --  9.2  ? ?GFR: ?Estimated Creatinine Clearance: 60.7 mL/min (A) (by C-G formula based on SCr of 0.54 mg/dL (L)). ?Recent Labs  ?Lab 04/11/21 ?1118 04/11/21 ?1546 04/12/21 ?0755  ?WBC 10.8* 8.8 10.1  ?LATICACIDVEN 1.3  --   --   ? ? ?Liver Function Tests: ?Recent Labs  ?Lab 04/11/21 ?1118  ?AST 24  ?ALT 27  ?ALKPHOS 132*  ?BILITOT 0.4  ?PROT 6.6  ?ALBUMIN 3.1*  ? ?No results for input(s): LIPASE, AMYLASE in the last 168 hours. ?No results for input(s): AMMONIA in the last 168 hours. ? ?ABG ?   ?Component Value Date/Time  ? PHART 7.393 04/12/2021 0747  ? PCO2ART 52.7 (H) 04/12/2021 0747  ? PO2ART 51 (L) 04/12/2021 0747  ? HCO3 32.2 (H) 04/12/2021 0747  ? TCO2 34 (H) 04/12/2021 0747  ? O2SAT 85 04/12/2021 0747  ?  ? ?Coagulation Profile: ?No results for input(s): INR, PROTIME in the last 168 hours. ? ?Cardiac Enzymes: ?No results for input(s): CKTOTAL, CKMB, CKMBINDEX, TROPONINI in the last 168 hours. ? ?HbA1C: ?Hgb A1c MFr Bld  ?Date/Time Value Ref Range Status  ?04/11/2021 09:12  PM 5.5 4.8 - 5.6 % Final  ?  Comment:  ?  (NOTE) ?Pre diabetes:          5.7%-6.4% ? ?Diabetes:              >6.4% ? ?Glycemic control for   <7.0% ?adults with diabetes ?  ?10/02/2020 04:46 AM 6.1 (H) 4.8 - 5.6 % Final  ?  Comment:  ?  (NOTE) ?Pre diabetes:          5.7%-6.4% ? ?Diabetes:              >6.4% ? ?Glycemic control for   <7.0% ?adults with diabetes ?  ? ? ?CBG: ?Recent Labs  ?Lab 04/11/21 ?1710 04/11/21 ?1916 04/11/21 ?2309 04/12/21 ?7939  04/12/21 ?0726  ?GLUCAP 97 172* 89 91 127*  ? ? ?Review of Systems:   ?Please see the history of present illness. All other systems reviewed and are negative  ? ? ?Past Medical History:  ?He,  has a past medical history of CHF (congestive heart failure) ([place]), Coronary artery disease, GI bleed (2019), Headache, Low blood sugar, Mitral regurgitation, Mitral valve prolapse, Osteopenia (2021), and Paroxysmal atrial fibrillation ([place]).  ? ?Surgical History:  ? ?Past Surgical History:  ?Procedure Laterality Date  ? COLONOSCOPY WITH PROPOFOL N/A 02/19/2017  ? Procedure: COLONOSCOPY WITH PROPOFOL;  Surgeon: Wilford Corner, MD;  Location: [facility];  Service: Endoscopy;  Laterality: N/A;  ? IR GASTROSTOMY TUBE MOD SED  10/18/2020  ? IR IVC FILTER PLMT / S&I /IMG GUID/MOD SED  11/01/2020  ? laser eye surgery for retina detachment    ? MITRAL VALVE REPAIR  01/2003  ? monitor  02/05/2006  ? polyp removal    ? TONSILLECTOMY    ? tooth removal    ? as a teenager  ? TRACHEOSTOMY TUBE PLACEMENT N/A 10/26/2020  ? Procedure: TRACHEOSTOMY;  Surgeon: Rozetta Nunnery, MD;  Location: [facility];  Service: ENT;  Laterality: N/A;  ?  ? ?Social History:  ? reports that he has never smoked. He has never used smokeless tobacco. He reports current alcohol use. He reports that he does not use drugs.  ? ?Family History:  ?His family history includes Asthma in his daughter; CAD in his mother; Epilepsy in his son; Heart disease in his mother; Heart failure in his father, maternal grandmother, and  paternal grandmother; Migraines in his mother; Pneumonia in his maternal grandfather; Skin cancer in his father; Stroke in his paternal grandfather; Tremor in his father; Valvular heart disease in his father.

## 2021-04-12 NOTE — Progress Notes (Signed)
Attempted inserting 6 mm Bivona trach but Vte was too low. Air leak was too large to ventilate Pt. Put Pt back on Shiley 55mm XLT Distal Cuffed and is currently stable sat's @ 100% . CO2 detector detected good color change, bilateral breaths sounds were heard and Vte is now close to the Vt.  MD made aware. RT will continue to monitor for any changes. ?

## 2021-04-12 NOTE — Progress Notes (Signed)
Initial Nutrition Assessment ? ?DOCUMENTATION CODES:  ? ?Severe malnutrition in context of chronic illness ? ?INTERVENTION:  ?Initiate tube feeding via PEG tube:  ?Jevity 1.5 at goal rate of 55 ml/h (1320 ml total per day)  ?Prosource TF 45 mL BID  ? ?Tube feeding regimen provides 2060 kcal, 106 gm protein, 1003 ml free water daily.  ? ?MVI per tube.  ? ?NUTRITION DIAGNOSIS:  ? ?Severe Malnutrition related to chronic illness (COPD, CHF, chronic respiratory failure requiring vent support) as evidenced by severe muscle depletion, severe fat depletion. ? ? ?GOAL:  ? ?Patient will meet greater than or equal to 90% of their needs ? ? ?MONITOR:  ? ?Vent status, TF tolerance, Weight trends, Labs ? ?REASON FOR ASSESSMENT:  ? ?Consult ?Enteral/tube feeding initiation and management ? ?ASSESSMENT:  ? ?Pt is a 81 year old male from [place] who was admitted after chest x-ray at [facility] revealed right sided pneumothorax and hypotension. 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 ? ?Per chart review, pt's tube feeding regimen at [facility] is Jevity 1.5 at 50 ml/hr and Prostat x2 (one packet provides 100 kcal, 15 gm protein). Pt's tube feeding regimen provides 2000 kcal, 107 gm protein, 912 ml free water daily. Also noted that pt is on vitamin C, vitamin D, MVI, zinc, omega-3 at [facility].  ? ?Met with pt at bedside. Pt alert and oriented and able to answer some questions appropriately. Pt denies any abdominal pain. Pt reports that he is having jaw pain while pointing to both sides of his jaw. Pt reports that the left side of his jaw always hurts, but today he is experiencing right jaw pain. Discussed with pt the plan to begin tube feeding regimen while pt is in hospital and pt agreeable to this plan.  ? ?Received MD Consult for TF initiation and management. ?PEG tube in  place.  ?MAP consistently >/= 65 this morning. ?Not on pressors. ?  ?Improvement in blood pressure and pneumothorax after chest tube placed. Plan to repeat chest x-ray today.   ? ?Current weight: 58.3 kg  ?Weight on 03/26/21 per weight history: 62.6 kg  ?Per chart review, noted pt's weight has been fluctuating over the past one year. Pt's weight was 48.9 kg on 09/26/20 and 01/14/21 and pt's weight increased to 62.6 kg on 03/26/21. Suspect weight of 48.9 kg is an estimated weight. Per chart review, pt has experienced a 7% weight loss within the last one month, which is significant for time frame, however, unsure of accuracy.  ? ?Labs reviewed and include: CBG's: 89-172 x 24 hours, BUN: 25 (H), Creatinine: 0.54 (L), Hemoglobin: 10.1 (L)  ? ?Medications reviewed and include:  ? furosemide  20 mg Per Tube Daily  ? insulin aspart  0-9 Units Subcutaneous Q4H  ? pantoprazole sodium  40 mg Per Tube Daily ?  ?IVF: NS at 100 ml/hr ? ?NUTRITION - FOCUSED PHYSICAL EXAM: ? ?Flowsheet Row Most Recent Value  ?Orbital Region Moderate depletion  ?Upper Arm Region Severe depletion  ?Thoracic and Lumbar Region Severe depletion  ?Buccal Region Moderate depletion  ?Temple Region Moderate depletion  ?Clavicle Bone Region Severe depletion  ?Clavicle and Acromion Bone Region Severe depletion  ?Scapular Bone Region Severe depletion  ?Dorsal Hand Moderate depletion  ?Patellar Region Severe depletion  ?Anterior Thigh Region Severe depletion  ?Posterior Calf Region Severe depletion  ?Edema (RD Assessment) None  ?  Hair Reviewed  ?Eyes Reviewed  ?Mouth Reviewed  ?Skin Reviewed  ?Nails Reviewed  ? ?  ? ? ?Diet Order:   ?Diet Order   ? ? None  ? ?  ? ? ?EDUCATION NEEDS:  ? ?Not appropriate for education at this time ? ?Skin:  Skin Assessment: Reviewed RN Assessment ? ?Last BM:  3/23; type 5 ? ?Height:  ? ?Ht Readings from Last 1 Encounters:  ?03/26/21 5' 7"  (1.702 m)  ? ? ?Weight:  ? ?Wt Readings from Last 1 Encounters:  ?04/12/21 58.3 kg   ? ? ?Ideal Body Weight:  67.2 kg ? ?BMI:  Body mass index is 20.13 kg/m?. ? ?Estimated Nutritional Needs:  ? ?Kcal:  2000 - 2200 ? ?Protein:  100 - 115 gm ? ?Fluid:  >/= 2 L ? ? ? ?Maryruth Hancock, Dietetic Intern ?04/12/2021 11:58 AM ?

## 2021-04-13 ENCOUNTER — Inpatient Hospital Stay (HOSPITAL_COMMUNITY): Payer: Medicare Other

## 2021-04-13 LAB — MAGNESIUM
Magnesium: 1.8 mg/dL (ref 1.7–2.4)
Magnesium: 1.9 mg/dL (ref 1.7–2.4)

## 2021-04-13 LAB — CULTURE, RESPIRATORY W GRAM STAIN: Gram Stain: NONE SEEN

## 2021-04-13 LAB — GLUCOSE, CAPILLARY
Glucose-Capillary: 117 mg/dL — ABNORMAL HIGH (ref 70–99)
Glucose-Capillary: 101 mg/dL — ABNORMAL HIGH (ref 70–99)
Glucose-Capillary: 111 mg/dL — ABNORMAL HIGH (ref 70–99)
Glucose-Capillary: 131 mg/dL — ABNORMAL HIGH (ref 70–99)
Glucose-Capillary: 109 mg/dL — ABNORMAL HIGH (ref 70–99)

## 2021-04-13 LAB — PHOSPHORUS
Phosphorus: 3.6 mg/dL (ref 2.5–4.6)
Phosphorus: 3.5 mg/dL (ref 2.5–4.6)

## 2021-04-13 IMAGING — DX DG CHEST 1V PORT
1 series · 1 of 1 positions shown · non-contrast
Comparison: [DATE] and earlier exams.

CLINICAL DATA: Oxygen desaturation.  Follow-up study.

EXAM:
PORTABLE CHEST 1 VIEW

[chest ap]
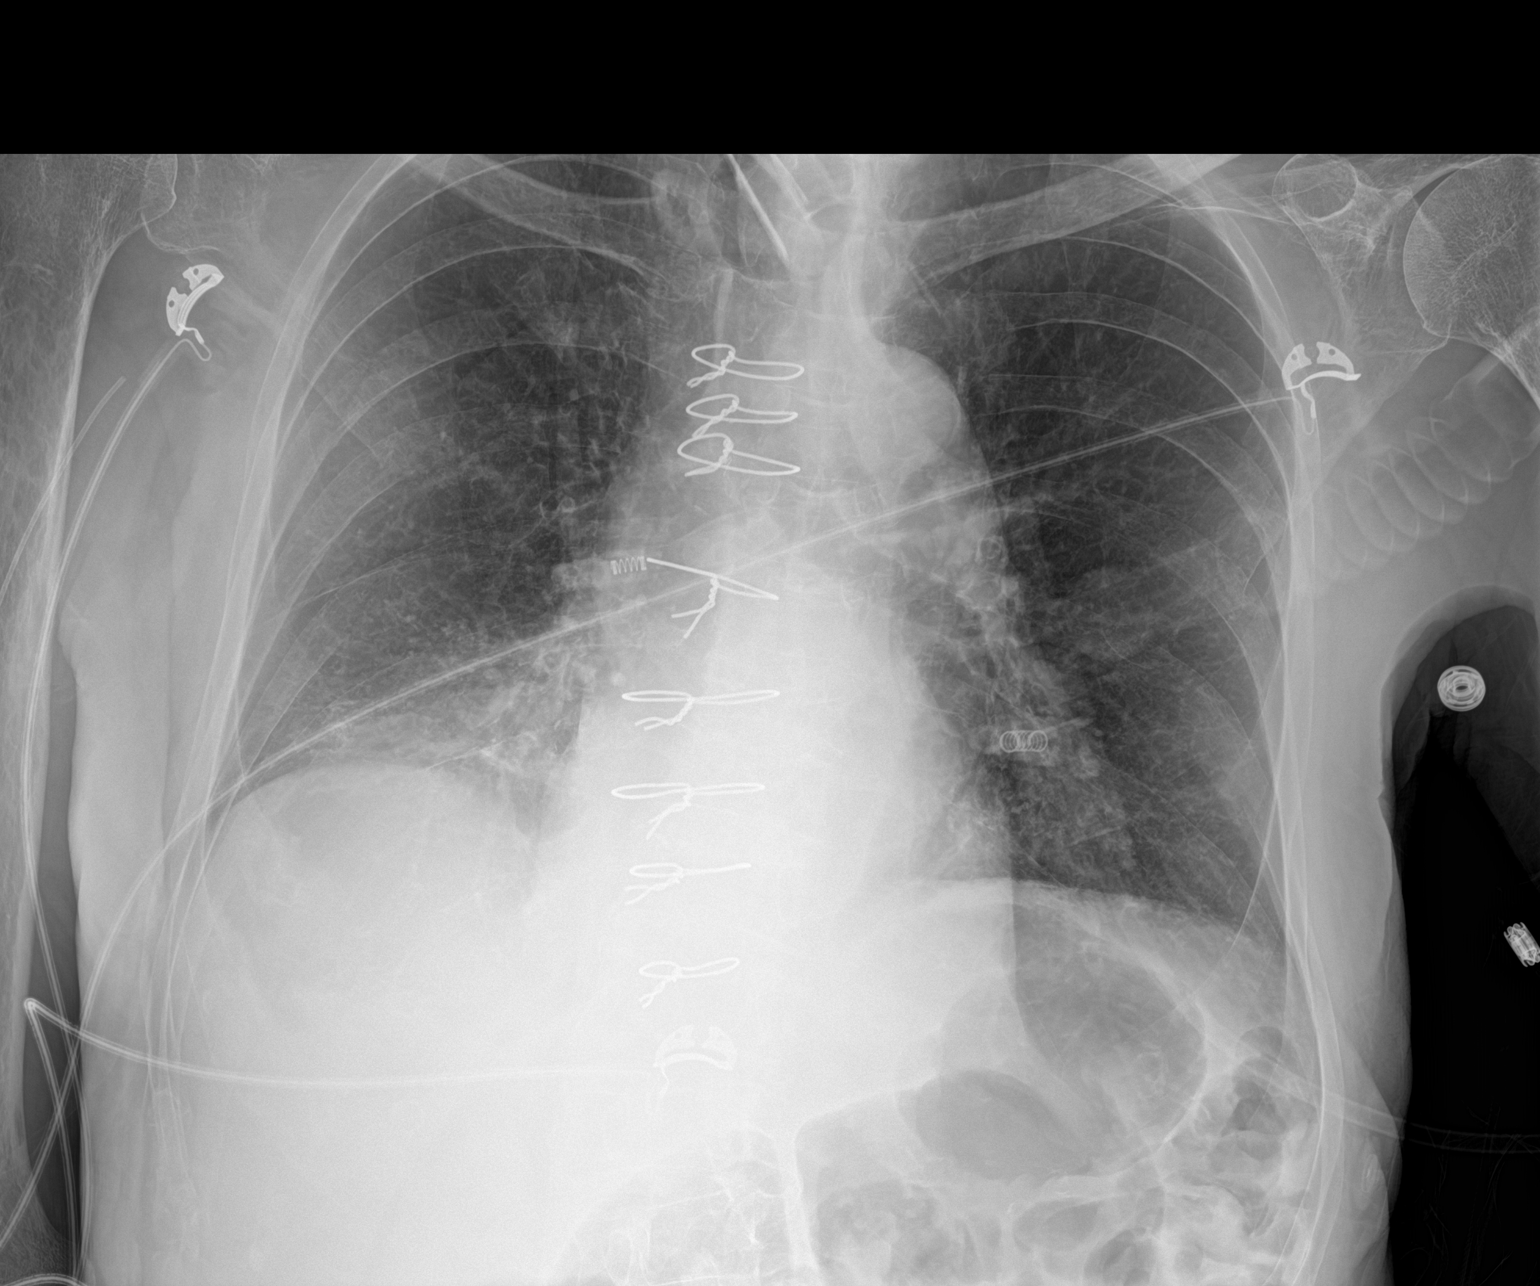

[1 of 1 positions shown; findings below may reference images not displayed]

FINDINGS: Since the previous day's exam, the right-sided chest tube has been
removed. No pneumothorax.

Opacity at the right lung base is stable. Minimal opacity at the
left lung base is also unchanged. No new lung abnormalities.

Stable well-positioned tracheostomy tube.

Tip of an apparent right PICC projects over the right axilla. This
is stable compared to the earlier exam from yesterday.
IMPRESSION: 1. Status post removal of the right-sided chest tube.
2. No pneumothorax. No other change from the most recent prior exam.

## 2021-04-13 MED ORDER — ACETAMINOPHEN 160 MG/5ML PO SOLN
650.0000 mg | Freq: Four times a day (QID) | ORAL | Status: DC | PRN
Start: 2021-04-13 — End: 2021-04-16
  Administered 2021-04-13 – 2021-04-15 (×2): 650 mg
  Filled 2021-04-13 (×2): qty 20.3

## 2021-04-13 MED ORDER — SODIUM CHLORIDE 0.9 % IV BOLUS
500.0000 mL | Freq: Once | INTRAVENOUS | Status: AC
  Administered 2021-04-13: 500 mL via INTRAVENOUS

## 2021-04-13 NOTE — Progress Notes (Signed)
eLink Physician-Brief Progress Note ?Patient Name: Austin Davis ?DOB: 1940-02-23 ?MRN: [id number] ? ? ?Date of Service ? 04/13/2021  ?HPI/Events of Note ? Patient c/o HA. AST and ALT both normal.  ?eICU Interventions ? Plan: ?Tylenol liquid 650 mg per tube Q 6 hours PRN HA.  ? ? ? ?Intervention Category ?Major Interventions: Other: ? ?Costella Schwarz Cornelia Copa ?04/13/2021, 8:43 PM ?

## 2021-04-13 NOTE — Progress Notes (Signed)
? ?NAME:  Austin Davis, MRN:  [id number], DOB:  1940-06-30, LOS: 2 ?ADMISSION DATE:  04/11/2021, CONSULTATION DATE:  04/11/21 ?REFERRING MD:  Rogene Houston - EM, CHIEF COMPLAINT:  ptx  ? 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  ?Recently had presented to the ED 3/6, 3/7 for hypoxia and likely aspiration PNA and was found to have bacteremia: Pseudomonas aeruginosa and VRE enterococcus faecium  ? ?PCCM called for evaluation in this setting, pt denies CP, nods when asked if it feels hard to breathe. ? ?Pertinent  Medical History  ? ?Chronic respiratory failure  ?Trach/vent dependence  ?DVT ?CHF ?Dysphagia ?Afib ?FTT ?Sacral ulcer ?Hypothyroidism ?Significant Hospital Events: ?Including procedures, antibiotic start and stop dates in addition to other pertinent events   ?3/23 to ED from [place] for ptx. Admit to PCCM ?3/23 Chest tube placement for pneumothorax, emergent bronch for mucus plugging ?3/24 chest tube removed ? ? ?Interim History / Subjective:  ?No distress. ?Chest tube out ? ?Objective   ?Blood pressure 109/70, pulse 77, temperature 98 ?F (36.7 ?C), temperature source Oral, resp. rate 16, weight 57.3 kg, SpO2 100 %. ?   ?Vent Mode: CPAP;PSV ?FiO2 (%):  [40 %-80 %] 40 % ?Set Rate:  [16 bmp] 16 bmp ?Vt Set:  [450 mL] 450 mL ?PEEP:  [5 cmH20] 5 cmH20 ?Pressure Support:  [10 cmH20] 10 cmH20 ?Plateau Pressure:  [9 cmH20-13 cmH20] 11 cmH20  ? ?Intake/Output Summary (Last 24 hours) at 04/13/2021 0949 ?Last data filed at 04/13/2021 0600 ?Gross per 24 hour  ?Intake 2391.58 ml  ?Output 1000 ml  ?Net 1391.58 ml  ? ? ?Filed Weights  ? 04/12/21 0500 04/13/21 0440  ?Weight: 58.3 kg 57.3 kg  ? ?Cachexic man in NAD ?Lungs with improved air movement ?Tremor noted,  stable ?PEG in place, abd soft ?Ext warm ?Foley is out, seems to be making urine without it ? ?Patient Lines/Drains/Airways Status   ? ? Active Line/Drains/Airways   ? ? Name Placement date Placement time Site Days  ? Midline Single Lumen Right Basilic --  --  Basilic  --  ? Gastrostomy/Enterostomy Gastrostomy 18 Fr. LUQ 10/18/20  0932  LUQ  177  ? External Urinary Catheter 04/12/21  1611  --  1  ? Incision (Closed) 10/26/20 Neck Other (Comment) 10/26/20  1029  -- 169  ? Tracheostomy Other (Comment) 6 mm Cuffed 10/26/20  1019  6 mm  169  ? Tracheostomy Shiley Flexible 6 mm Cuffed 10/26/20  --  6 mm  169  ? Tracheostomy Shiley XLT Distal 6 mm Cuffed 04/12/21  1716  6 mm  1  ? Pressure Injury 09/25/20 Vertebral column Left Deep Tissue Pressure Injury - Purple or maroon localized area of discolored intact skin or blood-filled blister due to damage of underlying soft tissue from pressure and/or shear. blanchable redness along le 09/25/20  2253  -- 200  ? ?  ?  ? ?  ? ? 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  Acute decompensation related to pneumothorax, mucus plugging, and possible HCAP.  Pneumothorax resolved, culture negative so far. ?  ?Recent Pseudomonas and VRE bacteremia- unclear if treated, repeat cultures pending.  No s/s of infection. ?  ?  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  ?Neurogenic bladder + BPH- resulting in chronic foley dependence.  ?  ?FTT, severe protein calorie malnutrition POA ?  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  ?Depression/anxiety ?  ?Hypothyroidism- on PTA synthroid ?  ?GERD, hx GIB- on PTA PPI ?  ?Hx Afib- not on AC I presume due to his GIB history sinus rhythm on tele here so maybe resolved ?  ?- Continue vent support ?- Nebs as ordered ?- Monitor fever curve, f/u BAL and blood culture data; hold further abx ?- See how does without foley ?- Will ask [facility] to take over starting 3/26,  we will follow intermittently for vent ?- Suspect can go back to vent-SNF Monday ?  ? ?Erskine Emery MD PCCM ?

## 2021-04-13 NOTE — Progress Notes (Signed)
eLink Physician-Brief Progress Note ?Patient Name: Austin Davis ?DOB: [birth year] ?MRN: [id number] ? ? ?Date of Service ? 04/13/2021  ?HPI/Events of Note ? Hypotension - BP = 79/52 with MAP = 61. LVEF = 45-50%.  ?eICU Interventions ? Plan: ?Bolus with 0.9 NaCl 500 mL IV over 30 minutes now.  ?  ? ? ? ?Intervention Category ?Major Interventions: Hypotension - evaluation and management ? ?Amiee Wiley Cornelia Copa ?04/13/2021, 10:43 PM ?

## 2021-04-14 DIAGNOSIS — J930 Spontaneous tension pneumothorax: Secondary | ICD-10-CM | POA: Diagnosis not present

## 2021-04-14 DIAGNOSIS — Z9911 Dependence on respirator [ventilator] status: Secondary | ICD-10-CM

## 2021-04-14 DIAGNOSIS — J9622 Acute and chronic respiratory failure with hypercapnia: Secondary | ICD-10-CM | POA: Diagnosis not present

## 2021-04-14 DIAGNOSIS — J9621 Acute and chronic respiratory failure with hypoxia: Secondary | ICD-10-CM

## 2021-04-14 LAB — GLUCOSE, CAPILLARY
Glucose-Capillary: 100 mg/dL — ABNORMAL HIGH (ref 70–99)
Glucose-Capillary: 101 mg/dL — ABNORMAL HIGH (ref 70–99)
Glucose-Capillary: 106 mg/dL — ABNORMAL HIGH (ref 70–99)
Glucose-Capillary: 111 mg/dL — ABNORMAL HIGH (ref 70–99)
Glucose-Capillary: 112 mg/dL — ABNORMAL HIGH (ref 70–99)
Glucose-Capillary: 121 mg/dL — ABNORMAL HIGH (ref 70–99)
Glucose-Capillary: 87 mg/dL (ref 70–99)

## 2021-04-14 LAB — PHOSPHORUS: Phosphorus: 2.3 mg/dL — ABNORMAL LOW (ref 2.5–4.6)

## 2021-04-14 LAB — MAGNESIUM: Magnesium: 1.9 mg/dL (ref 1.7–2.4)

## 2021-04-14 NOTE — Assessment & Plan Note (Signed)
-  on tube feeds ?

## 2021-04-14 NOTE — Assessment & Plan Note (Signed)
-   not on AC due to his GIB history  ?-appears to be paroxysmal ?-BB on hold due to low BP ?

## 2021-04-14 NOTE — Progress Notes (Signed)
?PROGRESS NOTE ? ? ? ?Austin Davis  Austin Davis DOB: Nov 05, 1940 DOA: 04/11/2021 ?PCP: Rosaria Ferries, MD  ? ? 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  ?Recently had presented to the ED 3/6, 3/7 for hypoxia and likely aspiration PNA and was found to have bacteremia: Pseudomonas aeruginosa and VRE enterococcus faecium   ? ? ?Assessment and Plan: ?Failure to thrive in adult ?-on tube feeds ? 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  Acute decompensation related to pneumothorax, mucus plugging, and possible HCAP.  Pneumothorax resolved, culture negative so far. ?-PCCM managing vent/trach ? ? ?Atrial fibrillation ([place]) ?- not on AC due to his GIB history  ?-appears to be paroxysmal ?-BB on hold due to low BP ? ? ? ? ?Patient became hypotensive overnight-- holding lasix and BB ?-has been on IVF since Thursday??  Will place stop date as no reason to get IVF and lasix at the same time ? ? ? ?DVT prophylaxis: enoxaparin (LOVENOX) injection 40 mg Start: 04/11/21 1530 ?SCDs Start: 04/11/21 1517 ? ?  Code Status: Full Code ?Family Communication: none at bedside ? ?Disposition Plan:  ?Level of care: ICU ?Status is: Inpatient ?Remains inpatient appropriate because: needs to return to [facility] ?  ? ?Consultants:  ?PCCM ? ? ?Subjective: ?Sleeping but will awaken, no complaints  ? ?Objective: ?Vitals:  ? 04/14/21 0800 04/14/21 0900 04/14/21 1000 04/14/21 1135  ?BP: (!) 91/56 104/77 94/60   ?Pulse: 62 65 62   ?Resp: 16 (!) 22 16   ?Temp:    98.3 ?F (36.8 ?C)  ?TempSrc:    Axillary  ?SpO2: 100% 100% 100%   ?Weight:      ? ? ?Intake/Output Summary (Last 24 hours) at  04/14/2021 1157 ?Last data filed at 04/14/2021 1000 ?Gross per 24 hour  ?Intake 4150.92 ml  ?Output 501 ml  ?Net 3649.92 ml  ? ?Filed Weights  ? 04/12/21 0500 04/13/21 0440 04/14/21 0500  ?Weight: 58.3 kg 57.3 kg 58.8 kg  ? ? ?Examination: ? ? ?General: Appearance:    Chronically ill appearing male in no acute distress  ?   ?Lungs:     Trach in place, respirations unlabored  ?Heart:    Normal heart rate.   ?  ?   ?Neurologic:   Will awaken and answer questions  ?  ? ? ? ?Data Reviewed: I have personally reviewed following labs and imaging studies ? ?CBC: ?Recent Labs  ?Lab 04/11/21 ?1118 04/11/21 ?1546 04/12/21 ?8119 04/12/21 ?0755  ?WBC 10.8* 8.8  --  10.1  ?NEUTROABS 8.5*  --   --   --   ?HGB 11.6* 11.0* 10.2* 10.1*  ?HCT 37.2* 34.4* 30.0* 32.1*  ?MCV 91.0 90.5  --  90.9  ?PLT 291 230  --  226  ? ?Basic Metabolic Panel: ?Recent Labs  ?Lab 04/11/21 ?1118 04/11/21 ?1546 04/12/21 ?1478 04/12/21 ?2956 04/12/21 ?1632 04/13/21 ?0155 04/13/21 ?1634 04/14/21 ?0309  ?NA 140  --  138 138  --   --   --   --   ?K 4.2  --  3.9 3.9  --   --   --   --   ?CL 97*  --   --  102  --   --   --   --   ?CO2 34*  --   --  29  --   --   --   --   ?GLUCOSE 127*  --   --  132*  --   --   --   --   ?BUN 31*  --   --  25*  --   --   --   --   ?CREATININE 0.52* 0.54*  --  0.54*  --   --   --   --   ?CALCIUM 9.4  --   --  9.2  --   --   --   --   ?MG  --   --   --   --  1.8 1.8 1.9 1.9  ?PHOS  --   --   --   --  4.0 3.6 3.5 2.3*  ? ?GFR: ?Estimated Creatinine Clearance: 61.3 mL/min (A) (by C-G formula based on SCr of 0.54 mg/dL (L)). ?Liver Function Tests: ?Recent Labs  ?Lab 04/11/21 ?1118  ?AST 24  ?ALT 27  ?ALKPHOS 132*  ?BILITOT 0.4  ?PROT 6.6  ?ALBUMIN 3.1*  ? ?No results for input(s): LIPASE, AMYLASE in the last 168 hours. ?No results for input(s): AMMONIA in the last 168 hours. ?Coagulation Profile: ?No results for input(s): INR, PROTIME in the last 168 hours. ?Cardiac Enzymes: ?No results for input(s): CKTOTAL, CKMB, CKMBINDEX, TROPONINI  in the last 168 hours. ?BNP (last 3 results) ?No results for input(s): PROBNP in the last 8760 hours. ?HbA1C: ?Recent Labs  ?  04/11/21 ?2112  ?HGBA1C 5.5  ? ?CBG: ?Recent Labs  ?Lab 04/13/21 ?1913 04/14/21 ?0025 04/14/21 ?0308 04/14/21 ?0355 04/14/21 ?1107  ?GLUCAP 109* 111* 112* 87 106*  ? ?Lipid Profile: ?No results for input(s): CHOL, HDL, LDLCALC, TRIG, CHOLHDL, LDLDIRECT in the last 72 hours. ?Thyroid Function Tests: ?No results for input(s): TSH, T4TOTAL, FREET4, T3FREE, THYROIDAB in the last 72 hours. ?Anemia Panel: ?No results for input(s): VITAMINB12, FOLATE, FERRITIN, TIBC, IRON, RETICCTPCT in the last 72 hours. ?Sepsis Labs: ?Recent Labs  ?Lab 04/11/21 ?1118  ?LATICACIDVEN 1.3  ? ? ?Recent Results (from the past 240 hour(s))  ?Culture, blood (Routine X 2) w Reflex to ID Panel     Status: None (Preliminary result)  ? Collection Time: 04/11/21 11:40 AM  ? Specimen: BLOOD RIGHT FOREARM  ?Result Value Ref Range Status  ? Specimen Description BLOOD RIGHT FOREARM  Final  ? Special Requests   Final  ?  BOTTLES DRAWN AEROBIC AND ANAEROBIC Blood Culture adequate volume  ? Culture   Final  ?  NO GROWTH 3 DAYS ?Performed at [facility], [place] [address] [place], [place] [address] ?  ? Report Status PENDING  Incomplete  ?Culture, blood (Routine X 2) w Reflex to ID Panel     Status: None (Preliminary result)  ? Collection Time: 04/11/21 11:45 AM  ? Specimen: BLOOD RIGHT HAND  ?Result Value Ref Range Status  ? Specimen Description BLOOD RIGHT HAND  Final  ? Special Requests   Final  ?  BOTTLES DRAWN AEROBIC AND ANAEROBIC Blood Culture results may not be optimal due to an inadequate volume of blood received in culture bottles  ? Culture   Final  ?  NO GROWTH 3 DAYS ?Performed at [facility], [place] [address], [place], [place] [address] ?  ? Report Status PENDING  Incomplete  ?Culture, Respiratory w Gram Stain  Status: None  ? Collection Time: 04/11/21  5:35 PM  ? Specimen: Bronchoalveolar Lavage;  Respiratory  ?Result Value Ref Range Status  ? Specimen Description BRONCHIAL ALVEOLAR LAVAGE  Final  ? Special Requests RLL  Final  ? Gram Stain   Final  ?  NO SQUAMOUS EPITHELIAL CELLS SEEN ?FEW WBC SEEN ?NO ORGANISMS SEEN ?  ? Culture   Final  ?  RARE DIPHTHEROIDS(CORYNEBACTERIUM SPECIES) ?Standardized susceptibility testing for this organism is not available. ?Performed at [facility], [place] [address], [place], [place] [address] ?  ? Report Status 04/13/2021 FINAL  Final  ?MRSA Next Gen by PCR, Nasal     Status: None  ? Collection Time: 04/11/21  9:21 PM  ? Specimen: Nasal Mucosa; Nasal Swab  ?Result Value Ref Range Status  ? MRSA by PCR Next Gen NOT DETECTED NOT DETECTED Final  ?  Comment: (NOTE) ?The GeneXpert MRSA Assay (FDA approved for NASAL specimens only), ?is one component of a comprehensive MRSA colonization surveillance ?program. It is not intended to diagnose MRSA infection nor to guide ?or monitor treatment for MRSA infections. ?Test performance is not FDA approved in patients less than 2 years ?old. ?Performed at [facility], [place] [address] [place], [place] ?[address] ?  ?  ? ? ? ? ? ?Radiology Studies: ?DG Chest Port 1 View ? ?Result Date: 04/13/2021 ?CLINICAL DATA:  Oxygen desaturation.  Follow-up study. EXAM: PORTABLE CHEST 1 VIEW COMPARISON:  04/12/2021 and earlier exams. FINDINGS: Since the previous day's exam, the right-sided chest tube has been removed. No pneumothorax. Opacity at the right lung base is stable. Minimal opacity at the left lung base is also unchanged. No new lung abnormalities. Stable well-positioned tracheostomy tube. Tip of an apparent right PICC projects over the right axilla. This is stable compared to the earlier exam from yesterday. IMPRESSION: 1. Status post removal of the right-sided chest tube. 2. No pneumothorax. No other change from the most recent prior exam. Electronically Signed   By: Lajean Manes M.D.   On: 04/13/2021 08:06  ? ?DG CHEST PORT 1  VIEW ? ?Result Date: 04/12/2021 ?CLINICAL DATA:  80-year-old male with desaturation, history of pneumonia. EXAM: PORTABLE CHEST - 1 VIEW COMPARISON:  Earlier the same day FINDINGS: Apical projection. The mediastin

## 2021-04-14 NOTE — Hospital Course (Addendum)
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  ?Recently had presented to the ED 3/6, 3/7 for hypoxia and likely aspiration PNA and was found to have bacteremia: Pseudomonas aeruginosa and VRE enterococcus faecium.   ?

## 2021-04-14 NOTE — Assessment & Plan Note (Addendum)
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 ?Acute decompensation related to pneumothorax, mucus plugging, and possible HCAP.  Pneumothorax resolved, culture negative so far. ?-PCCM managing vent/trach ? ?

## 2021-04-15 ENCOUNTER — Encounter (HOSPITAL_COMMUNITY): Payer: Self-pay | Admitting: Pulmonary Disease

## 2021-04-15 DIAGNOSIS — J930 Spontaneous tension pneumothorax: Secondary | ICD-10-CM | POA: Diagnosis not present

## 2021-04-15 DIAGNOSIS — Z9911 Dependence on respirator [ventilator] status: Secondary | ICD-10-CM | POA: Diagnosis not present

## 2021-04-15 LAB — CBC
HCT: 30.1 % — ABNORMAL LOW (ref 39.0–52.0)
Hemoglobin: 9.5 g/dL — ABNORMAL LOW (ref 13.0–17.0)
MCH: 28.7 pg (ref 26.0–34.0)
MCHC: 31.6 g/dL (ref 30.0–36.0)
MCV: 90.9 fL (ref 80.0–100.0)
Platelets: 160 10*3/uL (ref 150–400)
RBC: 3.31 MIL/uL — ABNORMAL LOW (ref 4.22–5.81)
RDW: 17.1 % — ABNORMAL HIGH (ref 11.5–15.5)
WBC: 7.5 10*3/uL (ref 4.0–10.5)
nRBC: 0 % (ref 0.0–0.2)

## 2021-04-15 LAB — BASIC METABOLIC PANEL
Anion gap: 5 (ref 5–15)
BUN: 18 mg/dL (ref 8–23)
CO2: 31 mmol/L (ref 22–32)
Calcium: 8.5 mg/dL — ABNORMAL LOW (ref 8.9–10.3)
Chloride: 101 mmol/L (ref 98–111)
Creatinine, Ser: 0.41 mg/dL — ABNORMAL LOW (ref 0.61–1.24)
GFR, Estimated: >60 mL/min (ref >60)
Glucose, Bld: 112 mg/dL — ABNORMAL HIGH (ref 70–99)
Potassium: 3.7 mmol/L (ref 3.5–5.1)
Sodium: 137 mmol/L (ref 135–145)

## 2021-04-15 LAB — GLUCOSE, CAPILLARY
Glucose-Capillary: 104 mg/dL — ABNORMAL HIGH (ref 70–99)
Glucose-Capillary: 116 mg/dL — ABNORMAL HIGH (ref 70–99)
Glucose-Capillary: 121 mg/dL — ABNORMAL HIGH (ref 70–99)
Glucose-Capillary: 151 mg/dL — ABNORMAL HIGH (ref 70–99)
Glucose-Capillary: 94 mg/dL (ref 70–99)
Glucose-Capillary: 96 mg/dL (ref 70–99)

## 2021-04-15 MED ORDER — POTASSIUM CHLORIDE 20 MEQ PO PACK
40.0000 meq | PACK | Freq: Once | ORAL | Status: AC
Start: 2021-04-15 — End: 2021-04-15
  Administered 2021-04-15: 40 meq
  Filled 2021-04-15: qty 2

## 2021-04-15 MED ORDER — IPRATROPIUM-ALBUTEROL 0.5-2.5 (3) MG/3ML IN SOLN
3.0000 mL | Freq: Four times a day (QID) | RESPIRATORY_TRACT | Status: DC
  Administered 2021-04-15 – 2021-04-16 (×4): 3 mL via RESPIRATORY_TRACT
  Filled 2021-04-15 (×4): qty 3

## 2021-04-15 MED ORDER — FUROSEMIDE 40 MG PO TABS: 20.0000 mg | ORAL_TABLET | Freq: Every day | ORAL | Status: DC | PRN

## 2021-04-15 NOTE — Progress Notes (Addendum)
04/15/2021 ? ?I have seen and evaluated the patient for weekly vent management. ? ?S:  ?No events, trial of bivona the other day resulted in insufficient ventilation so continues with shiley XLT in slightly suboptimal position.  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 ? ?O: ?Blood pressure (!) 127/106, pulse 68, temperature 99 ?F (37.2 ?C), temperature source Oral, resp. rate 15, weight 57.6 kg, SpO2 100 %.  ?No distress ?Lung sounds diminished and +wheezing bilaterally ?Triggers vent ?Denies pain ?Stable tremor ? 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  Acute decompensation related to pneumothorax, mucus plugging.  BAL unremarkable.  Did not need abx. ?  ?P:  ?- Continue vent support, routine trach care ?- Stable for return to vent SNF ?- Will follow weekly while in hospital ?- In-line PMV trial ? ?[facility] ?Prefer epic messenger for cross cover needs ?If after hours, please call E-link ? ?

## 2021-04-15 NOTE — TOC Initial Note (Signed)
Transition of Care (TOC) - Initial/Assessment Note  ? ? ?Patient Details  ?Name: Austin Davis ?MRN: [id number] ?Date of Birth: January 28, 1940 ? ?Transition of Care [facility]) CM/SW Contact:    ?Joanne Chars, LCSW ?Phone Number: ?04/15/2021, 2:58 PM ? ?Clinical Narrative:   Pt is from subacute side of [facility].  CSW left message with Festus Barren at [facility].  CSW spoke with pt daughter [name] who reports pt has been at [facility] since last October.  She is asking for update from MD as to what pt was treated for while here.                ? ? ?Expected Discharge Plan: [place] ?Barriers to Discharge: Other (must enter comment) (have not been able to speak to [facility] admissions) ? ? ?Patient Goals and CMS Choice ?  ?  ?  ? ?Expected Discharge Plan and Services ?Expected Discharge Plan: [place] ?In-house Referral: Clinical Social Work ?  ?Post Acute Care Choice: [place] ?Living arrangements for the past 2 months: [place] ?                ?  ?  ?  ?  ?  ?  ?  ?  ?  ?  ? ?Prior Living Arrangements/Services ?Living arrangements for the past 2 months: [place] ?Lives with:: Facility Resident ?  ?       ?  ?  ?Current home services: Other (comment) (na) ?  ? ?Activities of Daily Living ?  ?  ? ?Permission Sought/Granted ?  ?  ?   ?   ?   ?   ? ?Emotional Assessment ?  ?  ?  ?  ?  ?  ? ?Admission diagnosis:  Ventilator dependent ([place]) [Z99.11] ?Primary spontaneous pneumothorax [J93.11] ?Pneumothorax [J93.9] ?Patient Active Problem List  ? Diagnosis Date Noted  ? Pneumothorax 04/11/2021  ? Ventilator dependent ([place])   ? Aspiration pneumonia ([place]) 03/25/2021  ? 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 Obstructive sleep apnea 10/24/2020  ? Chronic obstructive pulmonary disease ([place]) 10/24/2020  ? Chronic heart failure with preserved ejection fraction ([place]) 10/24/2020  ? Unintentional weight loss 09/20/2020  ? Acute metabolic encephalopathy 75/91/6384  ? SVT  (supraventricular tachycardia) ([place]) 09/20/2020  ? Protein-calorie malnutrition, severe 09/17/2020  ? Acute on chronic respiratory failure with hypoxia and hypercapnia ([place]) 09/16/2020  ? Failure to thrive in adult 09/16/2020  ? AF (paroxysmal atrial fibrillation) ([place]) 09/16/2020  ? Stage 1 skin ulcer of sacral region ([place]) 09/16/2020  ? Hypotension 06/30/2020  ? GIB (gastrointestinal bleeding) 02/17/2017  ? Rectal bleeding 02/16/2017  ? 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 Essential tremor 02/19/2015  ? Status post mitral valve annuloplasty and MAZE 2005 12/12/2014  ? HLD (hyperlipidemia) 05/05/2013  ? DOE (dyspnea on exertion) 04/22/2013  ? Fatigue 04/22/2013  ? Atrial fibrillation ([place]) 05/08/2012  ? ?PCP:  Rosaria Ferries, MD ?Pharmacy:  No Pharmacies Listed ? ? ? ?Social Determinants of Health (SDOH) Interventions ?  ? ?Readmission Risk Interventions ?   ? View : No data to display.  ?  ?  ?  ? ? ? ?

## 2021-04-15 NOTE — Progress Notes (Signed)
?PROGRESS NOTE ? ? ? ?Austin Davis  ZSM:[id number] DOB: 1940/11/07 DOA: 04/11/2021 ?PCP: Rosaria Ferries, MD  ? ? 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  ?Recently had presented to the ED 3/6, 3/7 for hypoxia and likely aspiration PNA and was found to have bacteremia: Pseudomonas aeruginosa and VRE enterococcus faecium   ? ? ?Assessment and Plan: ?Failure to thrive in adult ?-on tube feeds ? 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  Acute decompensation related to pneumothorax, mucus plugging, and possible HCAP.  Pneumothorax resolved, culture negative so far. ?-PCCM managing vent/trach ? ? ?Atrial fibrillation ([place]) ?- not on AC due to his GIB history  ?-appears to be paroxysmal ?-BB on hold due to low BP ? ? ? ? ?Changed lasix to PRN and changed PRN nebs to scheduled  ? ? ? ?DVT prophylaxis: enoxaparin (LOVENOX) injection 40 mg Start: 04/11/21 1530 ?SCDs Start: 04/11/21 1517 ? ?  Code Status: Full Code ?Family Communication: none at bedside ? ?Disposition Plan:  ?Level of care: ICU ?Status is: Inpatient ?Remains inpatient appropriate because: needs to return to [facility] ?  ? ?Consultants:  ?PCCM ? ? ?Subjective: ?Asking for a breathing treatment ? ?Objective: ?Vitals:  ? 04/15/21 0900 04/15/21 1000 04/15/21 1016 04/15/21 1122  ?BP: 104/70 122/82    ?Pulse: 72 70 71   ?Resp: _0 ?Temp:      ?TempSrc:      ?SpO2: 100% 100% 100% 100%  ?Weight:      ? ? ?Intake/Output Summary (Last 24 hours) at 04/15/2021 1146 ?Last data filed at 04/15/2021 1000 ?Gross per 24 hour  ?Intake 2604.43 ml  ?Output 1760 ml  ?Net 844.43 ml  ? ?Filed Weights  ? 04/13/21  0440 04/14/21 0500 04/15/21 0444  ?Weight: 57.3 kg 58.8 kg 57.6 kg  ? ? ?Examination: ? ? ?General: Appearance:    Chronically ill appearing male in no acute distress  ?   ?Lungs:     Trach in place, wheezing b/l  ?Heart:    Normal heart rate.   ?  ?   ?Neurologic:   Will awaken and answer questions  ?  ? ? ? ?Data Reviewed: I have personally reviewed following labs and imaging studies ? ?CBC: ?Recent Labs  ?Lab 04/11/21 ?1118 04/11/21 ?1546 04/12/21 ?4920 04/12/21 ?1007 04/15/21 ?0343  ?WBC 10.8* 8.8  --  10.1 7.5  ?NEUTROABS 8.5*  --   --   --   --   ?HGB 11.6* 11.0* 10.2* 10.1* 9.5*  ?HCT 37.2* 34.4* 30.0* 32.1* 30.1*  ?MCV 91.0 90.5  --  90.9 90.9  ?PLT 291 230  --  226 160  ? ?Basic Metabolic Panel: ?Recent Labs  ?Lab 04/11/21 ?1118 04/11/21 ?1546 04/12/21 ?1219 04/12/21 ?7588 04/12/21 ?1632 04/13/21 ?0155 04/13/21 ?1634 04/14/21 ?0309 04/15/21 ?3254  ?NA 140  --  138 138  --   --   --   --  137  ?K 4.2  --  3.9 3.9  --   --   --   --  3.7  ?CL 97*  --   --  102  --   --   --   --  101  ?CO2 34*  --   --  29  --   --   --   --  31  ?GLUCOSE 127*  --   --  132*  --   --   --   --  112*  ?BUN 31*  --   --  25*  --   --   --   --  18  ?CREATININE 0.52* 0.54*  --  0.54*  --   --   --   --  0.41*  ?CALCIUM 9.4  --   --  9.2  --   --   --   --  8.5*  ?MG  --   --   --   --  1.8 1.8 1.9 1.9  --   ?PHOS  --   --   --   --  4.0 3.6 3.5 2.3*  --   ? ?GFR: ?Estimated Creatinine Clearance: 60 mL/min (A) (by C-G formula based on SCr of 0.41 mg/dL (L)). ?Liver Function Tests: ?Recent Labs  ?Lab 04/11/21 ?1118  ?AST 24  ?ALT 27  ?ALKPHOS 132*  ?BILITOT 0.4  ?PROT 6.6  ?ALBUMIN 3.1*  ? ?No results for input(s): LIPASE, AMYLASE in the last 168 hours. ?No results for input(s): AMMONIA in the last 168 hours. ?Coagulation Profile: ?No results for input(s): INR, PROTIME in the last 168 hours. ?Cardiac Enzymes: ?No results for input(s): CKTOTAL, CKMB, CKMBINDEX, TROPONINI in the last 168 hours. ?BNP (last 3 results) ?No results for  input(s): PROBNP in the last 8760 hours. ?HbA1C: ?No results for input(s): HGBA1C in the last 72 hours. ? ?CBG: ?Recent Labs  ?Lab 04/14/21 ?1519 04/14/21 ?1912 04/14/21 ?2354 04/15/21 ?7371 04/15/21 ?0719  ?GLUCAP 121* 101* 100* 104* 151*  ? ?Lipid Profile: ?No results for input(s): CHOL, HDL, LDLCALC, TRIG, CHOLHDL, LDLDIRECT in the last 72 hours. ?Thyroid Function Tests: ?No results for input(s): TSH, T4TOTAL, FREET4, T3FREE, THYROIDAB in the last 72 hours. ?Anemia Panel: ?No results for input(s): VITAMINB12, FOLATE, FERRITIN, TIBC, IRON, RETICCTPCT in the last 72 hours. ?Sepsis Labs: ?Recent Labs  ?Lab 04/11/21 ?1118  ?LATICACIDVEN 1.3  ? ? ?Recent Results (from the past 240 hour(s))  ?Culture, blood (Routine X 2) w Reflex to ID Panel     Status: None (Preliminary result)  ? Collection Time: 04/11/21 11:40 AM  ? Specimen: BLOOD RIGHT FOREARM  ?Result Value Ref Range Status  ? Specimen Description BLOOD RIGHT FOREARM  Final  ? Special Requests   Final  ?  BOTTLES DRAWN AEROBIC AND ANAEROBIC Blood Culture adequate volume  ? Culture   Final  ?  NO GROWTH 4 DAYS ?Performed at [facility], [place] [address], [place], [place] [address] ?  ? Report Status PENDING  Incomplete  ?Culture, blood (Routine X 2) w Reflex to ID Panel     Status: None (Preliminary result)  ? Collection Time: 04/11/21 11:45 AM  ? Specimen: BLOOD RIGHT HAND  ?Result Value Ref Range Status  ? Specimen Description BLOOD RIGHT HAND  Final  ? Special Requests   Final  ?  BOTTLES DRAWN AEROBIC AND ANAEROBIC Blood Culture results may not be optimal due to an inadequate volume of blood received in culture bottles  ? Culture   Final  ?  NO GROWTH 4 DAYS ?Performed at [facility], [place] [address] [place],  [address] ?  ? Report Status PENDING  Incomplete  ?Culture, Respiratory w Gram Stain  Status: None  ? Collection Time: 04/11/21  5:35 PM  ? Specimen: Bronchoalveolar Lavage; Respiratory  ?Result Value Ref Range Status  ? Specimen  Description BRONCHIAL ALVEOLAR LAVAGE  Final  ? Special Requests RLL  Final  ? Gram Stain   Final  ?  NO SQUAMOUS EPITHELIAL CELLS SEEN ?FEW WBC SEEN ?NO ORGANISMS SEEN ?  ? Culture   Final  ?  RARE DIPHTHEROIDS(CORYNEBACTERIUM SPECIES) ?Standardized susceptibility testing for this organism is not available. ?Performed at [facility], [place] [address] [place], [place] [address] ?  ? Report Status 04/13/2021 FINAL  Final  ?MRSA Next Gen by PCR, Nasal     Status: None  ? Collection Time: 04/11/21  9:21 PM  ? Specimen: Nasal Mucosa; Nasal Swab  ?Result Value Ref Range Status  ? MRSA by PCR Next Gen NOT DETECTED NOT DETECTED Final  ?  Comment: (NOTE) ?The GeneXpert MRSA Assay (FDA approved for NASAL specimens only), ?is one component of a comprehensive MRSA colonization surveillance ?program. It is not intended to diagnose MRSA infection nor to guide ?or monitor treatment for MRSA infections. ?Test performance is not FDA approved in patients less than 2 years ?old. ?Performed at [facility], [place] [address] [place], [place] ?[address] ?  ?  ? ? ? ? ? ?Radiology Studies: ?No results found. ? ? ? ? ? ?Scheduled Meds: ? chlorhexidine gluconate (MEDLINE KIT)  15 mL Mouth Rinse BID  ? Chlorhexidine Gluconate Cloth  6 each Topical Daily  ? clonazePAM  0.5 mg Per Tube BID  ? doxazosin  2 mg Per Tube Daily  ? enoxaparin (LOVENOX) injection  40 mg Subcutaneous Q24H  ? escitalopram  10 mg Per Tube Daily  ? feeding supplement (PROSource TF)  45 mL Per Tube BID  ? insulin aspart  0-9 Units Subcutaneous Q4H  ? ipratropium-albuterol  3 mL Nebulization Q6H  ? levothyroxine  25 mcg Per Tube QAC breakfast  ? loratadine  10 mg Per Tube Daily  ? mouth rinse  15 mL Mouth Rinse 10 times per day  ? midodrine  5 mg Per Tube TID WC  ? multivitamin with minerals  1 tablet Per Tube Daily  ? pantoprazole sodium  40 mg Per Tube Daily  ? ?Continuous Infusions: ? feeding supplement (JEVITY 1.5 CAL/FIBER) 1,000 mL (04/13/21 1516)  ? ? ?  LOS: 4 days  ? ? ?Time spent: 45 minutes spent on chart review, discussion with nursing staff, consultants, updating family and interview/physical exam; more than 50% of that time was spent in counselin

## 2021-04-16 DIAGNOSIS — J930 Spontaneous tension pneumothorax: Secondary | ICD-10-CM | POA: Diagnosis not present

## 2021-04-16 DIAGNOSIS — J9620 Acute and chronic respiratory failure, unspecified whether with hypoxia or hypercapnia: Secondary | ICD-10-CM | POA: Diagnosis not present

## 2021-04-16 DIAGNOSIS — F32A Depression, unspecified: Secondary | ICD-10-CM | POA: Diagnosis not present

## 2021-04-16 DIAGNOSIS — J9311 Primary spontaneous pneumothorax: Secondary | ICD-10-CM | POA: Diagnosis not present

## 2021-04-16 DIAGNOSIS — R0989 Other specified symptoms and signs involving the circulatory and respiratory systems: Secondary | ICD-10-CM | POA: Diagnosis not present

## 2021-04-16 DIAGNOSIS — R627 Adult failure to thrive: Secondary | ICD-10-CM

## 2021-04-16 DIAGNOSIS — J9621 Acute and chronic respiratory failure with hypoxia: Secondary | ICD-10-CM | POA: Diagnosis not present

## 2021-04-16 DIAGNOSIS — R1310 Dysphagia, unspecified: Secondary | ICD-10-CM | POA: Diagnosis not present

## 2021-04-16 DIAGNOSIS — Z430 Encounter for attention to tracheostomy: Secondary | ICD-10-CM | POA: Diagnosis not present

## 2021-04-16 DIAGNOSIS — R278 Other lack of coordination: Secondary | ICD-10-CM | POA: Diagnosis not present

## 2021-04-16 DIAGNOSIS — N400 Enlarged prostate without lower urinary tract symptoms: Secondary | ICD-10-CM | POA: Diagnosis not present

## 2021-04-16 DIAGNOSIS — I5023 Acute on chronic systolic (congestive) heart failure: Secondary | ICD-10-CM | POA: Diagnosis not present

## 2021-04-16 DIAGNOSIS — Z431 Encounter for attention to gastrostomy: Secondary | ICD-10-CM | POA: Diagnosis not present

## 2021-04-16 DIAGNOSIS — J9622 Acute and chronic respiratory failure with hypercapnia: Secondary | ICD-10-CM | POA: Diagnosis not present

## 2021-04-16 DIAGNOSIS — F419 Anxiety disorder, unspecified: Secondary | ICD-10-CM | POA: Diagnosis not present

## 2021-04-16 DIAGNOSIS — I480 Paroxysmal atrial fibrillation: Secondary | ICD-10-CM | POA: Diagnosis not present

## 2021-04-16 DIAGNOSIS — G4733 Obstructive sleep apnea (adult) (pediatric): Secondary | ICD-10-CM | POA: Diagnosis not present

## 2021-04-16 DIAGNOSIS — J95811 Postprocedural pneumothorax: Secondary | ICD-10-CM | POA: Diagnosis not present

## 2021-04-16 DIAGNOSIS — Z9911 Dependence on respirator [ventilator] status: Secondary | ICD-10-CM | POA: Diagnosis not present

## 2021-04-16 DIAGNOSIS — M6281 Muscle weakness (generalized): Secondary | ICD-10-CM | POA: Diagnosis not present

## 2021-04-16 DIAGNOSIS — I739 Peripheral vascular disease, unspecified: Secondary | ICD-10-CM | POA: Diagnosis not present

## 2021-04-16 DIAGNOSIS — G894 Chronic pain syndrome: Secondary | ICD-10-CM | POA: Diagnosis not present

## 2021-04-16 LAB — CULTURE, BLOOD (ROUTINE X 2)
Culture: NO GROWTH
Culture: NO GROWTH
Special Requests: ADEQUATE

## 2021-04-16 LAB — RESP PANEL BY RT-PCR (FLU A&B, COVID) ARPGX2
Influenza A by PCR: NEGATIVE
Influenza B by PCR: NEGATIVE
SARS Coronavirus 2 by RT PCR: NEGATIVE

## 2021-04-16 LAB — GLUCOSE, CAPILLARY
Glucose-Capillary: 102 mg/dL — ABNORMAL HIGH (ref 70–99)
Glucose-Capillary: 97 mg/dL (ref 70–99)
Glucose-Capillary: 98 mg/dL (ref 70–99)

## 2021-04-16 MED ORDER — ALPRAZOLAM 0.5 MG PO TABS: 0.5000 mg | ORAL_TABLET | Freq: Three times a day (TID) | ORAL | 0 refills | Status: DC | PRN

## 2021-04-16 MED ORDER — FUROSEMIDE 20 MG PO TABS: 20.0000 mg | ORAL_TABLET | Freq: Every day | ORAL | Status: DC | PRN

## 2021-04-16 MED ORDER — DOXAZOSIN MESYLATE 2 MG PO TABS: 2.0000 mg | ORAL_TABLET | Freq: Every day | ORAL | Status: DC

## 2021-04-16 MED ORDER — CLONAZEPAM 0.5 MG PO TABS: 0.5000 mg | ORAL_TABLET | Freq: Two times a day (BID) | ORAL | 0 refills | Status: DC

## 2021-04-16 NOTE — Discharge Summary (Addendum)
? ? ? ? ? ?Physician Discharge Summary  ?Austin Davis DPO:[id number] DOB: 09-21-1940 DOA: 04/11/2021 ? ?PCP: Aaron Edelman, MD ? ?Admit date: 04/11/2021 ?Discharge date: 04/16/2021 ? ?Admitted From: [facility] ?[facility] disposition: [facility] ? ? ?Recommendations for Outpatient Follow-Up:  ? ?Per Dr. Smith(PCCM): trial of bivona the other day resulted in insufficient ventilation so continues with shiley XLT in slightly suboptimal position.  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 Continue vent support, routine trach care.   Stable for return to vent SNF ?2.  Would recommend continued palliative care talks ? ? ?Discharge Diagnosis:  ? ?Principal Problem: ?  Pneumothorax ?Active Problems: ?  Atrial fibrillation ([place]) ?  Acute on chronic respiratory failure with hypoxia and hypercapnia (HCC) ?  Failure to thrive in adult ?   ? ? ? ?Discharge Condition: stable ? ?Diet recommendation: tube feed ? ?Wound care: None. ? ?Code status: Full. ? ? ?History of Present Illness:  ? ?64M with COPD, chronic respiratory failure with trach/vent dependence who came to the ER from [facility] with acute on chronic respiratory failure and hypotension due to right pneumothorax.  ?  ?Right anterior chest tube was placed in the ER as on bedside US the pneumothorax appeared larger than initial CXR in ER. Patient tolerated procedure well. Post-procedure x-ray shows improvement in pneumothorax. ?  ? ? ?Hospital Course by Problem:  ? ?PER Dr. 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  Acute decompensation related to pneumothorax, mucus plugging.  BAL unremarkable.  Did not need abx. ?  ?P:  ?- Continue vent support, routine trach care ?- Stable for return to vent SNF ?-scheduled nebs with improvement of wheezing ?-aspiration precautions ?-continued palliative care  discussions ? ?Failure to thrive in adult ?-on tube feeds ? ?Atrial fibrillation ([place]) ?- not on AC due to his GIB history  ?-appears to be paroxysmal ?-BB on hold due to low BP ? ?Medical Consultants:  ? ?PCCM ? ? ?Discharge Exam:  ? ?Vitals:  ? 04/16/21 0752 04/16/21 0800  ?BP:  113/68  ?Pulse:  67  ?Resp:  19  ?Temp: 98 ?F (36.7 ?C)   ?SpO2:  100%  ? ?Vitals:  ? 04/16/21 0700 04/16/21 0718 04/16/21 0752 04/16/21 0800  ?BP: (!) 116/104   113/68  ?Pulse: 61   67  ?Resp: 18   19  ?Temp:   98 ?F (36.7 ?C)   ?TempSrc:   Axillary   ?SpO2: 100% 100%  100%  ?Weight:      ? ? ?General exam: Appears calm and comfortable. ? ? ? ?The results of significant diagnostics from this hospitalization (including imaging, microbiology, ancillary and laboratory) are listed below for reference.   ? ? ?Procedures and Diagnostic Studies:  ? ?DG CHEST PORT 1 VIEW ? ?Result Date: 04/12/2021 ?CLINICAL DATA:  67-year-old male with desaturation, history of pneumonia. EXAM: PORTABLE CHEST - 1 VIEW COMPARISON:  Earlier the same day FINDINGS: Apical projection. The mediastinal silhouette is partially obscured due to technical factors, however appears similar to comparison. Tracheostomy cannula is in place. Right pigtail thoracostomy tube remains in place projecting over the basal right hemithorax. Similar appearing hazy opacities in the right lung base, slightly more radiopaque than comparison, though likely secondary to technical factors given differences in technique. No new focal consolidations. No evidence of significant pleural effusion or evidence of pneumothorax No acute osseous abnormality. IMPRESSION: 1. Similar  appearing right basilar opacity in keeping with history of pneumonia. No new acute findings to explain desaturation. 2. Unchanged position of right pigtail thoracostomy tube and tracheostomy cannula that evidence of pneumothorax or significant pleural effusion. Electronically Signed   By: Ruthann Cancer M.D.   On: 04/12/2021 15:21   ? ?DG CHEST PORT 1 VIEW ? ?Result Date: 04/12/2021 ?CLINICAL DATA:  81 year old male with history of pneumothorax. EXAM: PORTABLE CHEST 1 VIEW COMPARISON:  Chest x-ray 04/11/2021. FINDINGS: A tracheostomy tube is in place with tip 4.5 cm above the carina. Right sided small bore chest tube in position with pigtail reformed over the right mid to lower hemithorax. Trace residual right-sided pneumothorax. Patchy airspace consolidation is noted, most evident in the right lung base, increased compared to the prior study, presumably in the right lower lobe (though difficult to localize given the lack of obscuration of the right heart border or right hemidiaphragm). Some patchy consolidation is also noted to a lesser extent in the left lower lobe. No pleural effusions. No evidence of pulmonary edema. Heart size is normal. Upper mediastinal contours are within normal limits. Atherosclerotic calcifications are noted in the thoracic aorta. Status post median sternotomy. IMPRESSION: 1. Postoperative changes and support apparatus, as above. 2. Trace residual right pneumothorax. 3. Worsening airspace consolidation, particularly in the right lower lobe. Electronically Signed   By: Vinnie Langton M.D.   On: 04/12/2021 05:14  ? ?DG CHEST PORT 1 VIEW ? ?Result Date: 04/11/2021 ?CLINICAL DATA:  Pneumothorax EXAM: PORTABLE CHEST 1 VIEW COMPARISON:  04/11/2021 at 3:18 p.m. FINDINGS: 2 frontal views of the chest demonstrate repositioning of the pigtail drainage catheter, overlying the lateral right lung base. There is a trace residual right apical pneumothorax, decreased in size since prior study with pleural separation measuring less than 2 mm. Volume estimated far less than 5%. Rounded consolidation at the right lung base, with overall improved aeration since prior study. No evidence of pleural effusion. Stable tracheostomy tube. IMPRESSION: 1. Further decrease in the right apical pneumothorax seen previously, with pleural separation  now measuring less than 2 mm. 2. Improving aeration at the right lung base, with residual rounded consolidation possibly representing atelectasis. Electronically Signed   By: Randa Ngo M.D.   On: 04/11/2021 18:11  ? ?DG CHEST PORT 1 VIEW ? ?Result Date: 04/11/2021 ?CLINICAL DATA:  Oxygen desaturation. EXAM: PORTABLE CHEST 1 VIEW COMPARISON:  AP chest 04/11/2021 and 03/26/2021 FINDINGS: Interval placement of inferior right hemithorax pigtail chest tube with interval improvement in the previously seen right apical pneumothorax. This measures up to approximately 12 mm in craniocaudal dimension compared to 30 mm previously. Tracheostomy tube overlies the midline trachea. The patient is mildly rightward rotated. Status post median sternotomy. Heart size is not well assessed but grossly unchanged and likely within normal limits. Tortuous thoracic aorta is again noted. Moderate elevation of the right hemidiaphragm with right basilar linear likely subsegmental atelectasis in the region of the chest tube. The left lung is clear. No acute skeletal abnormality. IMPRESSION: Interval placement of right lower hemithorax chest tube and interval improvement in the previously seen right apical pneumothorax, now small. Electronically Signed   By: Yvonne Kendall M.D.   On: 04/11/2021 15:32  ? ?DG Chest Port 1 View ? ?Result Date: 04/11/2021 ?CLINICAL DATA:  Pneumonia Pneumothorax? Desaturating EXAM: PORTABLE CHEST - 1 VIEW COMPARISON:  03/26/2021 FINDINGS: Cardiomediastinal silhouette and pulmonary vasculature are within normal limits. Postop changes median sternotomy again seen. Interval increase of right medial lung base  opacity suspicious for atelectasis of the right middle lobe. Moderate size right pneumothorax is now present. Tracheostomy cannula in appropriate position. IMPRESSION: 1. Moderate sized right pneumothorax. 2. Worsening atelectasis of right middle lobe. Electronically Signed   By: Miachel Roux M.D.   On: 04/11/2021  12:15  ? ? ? ?Labs:  ? ?Basic Metabolic Panel: ?Recent Labs  ?Lab 04/11/21 ?1118 04/11/21 ?1546 04/12/21 ?2707 04/12/21 ?8675 04/12/21 ?1632 04/13/21 ?0155 04/13/21 ?1634 04/14/21 ?0309 04/15/21 ?4492  ?NA 140  --  138 1

## 2021-04-16 NOTE — TOC Transition Note (Signed)
Transition of Care (TOC) - CM/SW Discharge Note ? ? ?Patient Details  ?Name: Austin Davis ?MRN: [id number] ?Date of Birth: [birth year] ? ?Transition of Care [facility]) CM/SW Contact:  ?Joanne Chars, LCSW ?Phone Number: ?04/16/2021, 1:40 PM ? ? ?Clinical Narrative:   Pt discharging to [facility] unit.  RN call [phone] for report.  ? ? ? ?Final next level of care: [place] ?Barriers to Discharge: Barriers Resolved ? ? ?Patient Goals and CMS Choice ?  ?  ?  ? ?Discharge Placement ?  ?           ?Patient chooses bed at:  ([facility]) ?Patient to be transferred to facility by: [facility] ?Name of family member notified: daughter [name] ?Patient and family notified of of transfer: 04/16/21 ? ?Discharge Plan and Services ?In-house Referral: Clinical Social Work ?  ?Post Acute Care Choice: [place]          ?  ?  ?  ?  ?  ?  ?  ?  ?  ?  ? ?Social Determinants of Health (SDOH) Interventions ?  ? ? ?Readmission Risk Interventions ?   ? View : No data to display.  ?  ?  ?  ? ? ? ? ? ?

## 2021-04-16 NOTE — Consult Note (Signed)
? ?  [facility] CM Inpatient Consult ? ? ?04/16/2021 ? ?Austin Davis ?February 08, 1940 ?[id number] ? ?[place] [ACO] Patient: Medicare ACO [place] ? ?Primary Care Provider:  Aaron Edelman, MD ?*Patient is from [facility] ? ?Patient screened for hospitalization with noted extreme high risk score for unplanned readmission risk and to assess for potential [place] Management service needs for post hospital transition.  Review of patient's medical record reveals patient is to return to [facility]. ? ? ?Plan:  No [facility] care management needs for patient is LT vent-SNF level of care.  Will sign off. ? ?For questions contact:  ? ?Natividad Brood, RN BSN CCM ?[facility] Liaison ? [phone] business mobile phone ?Toll free office [phone]  ?Fax number: [phone] ?[email or web address] ?[email or web address]  ? ? ? ?

## 2021-04-16 NOTE — Progress Notes (Signed)
Report called to April, RN at [employer] at 1400. Pt left with transport at 1455, with stable vital signs. RN notified that patient is in route.  ?

## 2021-04-16 NOTE — TOC Progression Note (Addendum)
Transition of Care (TOC) - Progression Note  ? ? ?Patient Details  ?Name: ZYAD BOOMER ?MRN: [id number] ?Date of Birth: 1940/11/07 ? ?Transition of Care (TOC) CM/SW Contact  ?Joanne Chars, LCSW ?Phone Number: ?04/16/2021, 11:56 AM ? ?Clinical Narrative:   CSW spoke with Levada Dy at [facility] who requested DC summary and covid test.  No FL2 needed.  DC summary emailed. ? ?0071: follow up call to Levada Dy at [facility] to check status of pt return.   ? ?1330: TC Angela/[facility].  She needs copy of covid test and is ready for pt to transport.  Covid emailed.  ? ? ? ?Expected Discharge Plan: [place] ?Barriers to Discharge: Other (must enter comment) (have not been able to speak to [facility] admissions) ? ?Expected Discharge Plan and Services ?Expected Discharge Plan: [place] ?In-house Referral: Clinical Social Work ?  ?Post Acute Care Choice: [place] ?Living arrangements for the past 2 months: [place] ?Expected Discharge Date: 04/16/21               ?  ?  ?  ?  ?  ?  ?  ?  ?  ?  ? ? ?Social Determinants of Health (SDOH) Interventions ?  ? ?Readmission Risk Interventions ?   ? View : No data to display.  ?  ?  ?  ? ? ?

## 2021-04-17 DIAGNOSIS — G894 Chronic pain syndrome: Secondary | ICD-10-CM | POA: Diagnosis not present

## 2021-04-17 DIAGNOSIS — Z9911 Dependence on respirator [ventilator] status: Secondary | ICD-10-CM | POA: Diagnosis not present

## 2021-04-17 DIAGNOSIS — I739 Peripheral vascular disease, unspecified: Secondary | ICD-10-CM | POA: Diagnosis not present

## 2021-04-17 DIAGNOSIS — Z430 Encounter for attention to tracheostomy: Secondary | ICD-10-CM | POA: Diagnosis not present

## 2021-04-17 DIAGNOSIS — F419 Anxiety disorder, unspecified: Secondary | ICD-10-CM | POA: Diagnosis not present

## 2021-04-17 DIAGNOSIS — J95811 Postprocedural pneumothorax: Secondary | ICD-10-CM | POA: Diagnosis not present

## 2021-04-17 DIAGNOSIS — J9620 Acute and chronic respiratory failure, unspecified whether with hypoxia or hypercapnia: Secondary | ICD-10-CM | POA: Diagnosis not present

## 2021-04-17 DIAGNOSIS — Z431 Encounter for attention to gastrostomy: Secondary | ICD-10-CM | POA: Diagnosis not present

## 2021-04-17 DIAGNOSIS — R1310 Dysphagia, unspecified: Secondary | ICD-10-CM | POA: Diagnosis not present

## 2021-04-17 DIAGNOSIS — F32A Depression, unspecified: Secondary | ICD-10-CM | POA: Diagnosis not present

## 2021-04-17 DIAGNOSIS — I5023 Acute on chronic systolic (congestive) heart failure: Secondary | ICD-10-CM | POA: Diagnosis not present

## 2021-04-17 DIAGNOSIS — N400 Enlarged prostate without lower urinary tract symptoms: Secondary | ICD-10-CM | POA: Diagnosis not present

## 2021-04-17 DIAGNOSIS — R278 Other lack of coordination: Secondary | ICD-10-CM | POA: Diagnosis not present

## 2021-04-17 DIAGNOSIS — G4733 Obstructive sleep apnea (adult) (pediatric): Secondary | ICD-10-CM | POA: Diagnosis not present

## 2021-04-17 DIAGNOSIS — M6281 Muscle weakness (generalized): Secondary | ICD-10-CM | POA: Diagnosis not present

## 2021-04-17 DIAGNOSIS — R627 Adult failure to thrive: Secondary | ICD-10-CM | POA: Diagnosis not present

## 2021-04-18 DIAGNOSIS — Z431 Encounter for attention to gastrostomy: Secondary | ICD-10-CM | POA: Diagnosis not present

## 2021-04-18 DIAGNOSIS — F419 Anxiety disorder, unspecified: Secondary | ICD-10-CM | POA: Diagnosis not present

## 2021-04-18 DIAGNOSIS — R278 Other lack of coordination: Secondary | ICD-10-CM | POA: Diagnosis not present

## 2021-04-18 DIAGNOSIS — J9620 Acute and chronic respiratory failure, unspecified whether with hypoxia or hypercapnia: Secondary | ICD-10-CM | POA: Diagnosis not present

## 2021-04-18 DIAGNOSIS — N400 Enlarged prostate without lower urinary tract symptoms: Secondary | ICD-10-CM | POA: Diagnosis not present

## 2021-04-18 DIAGNOSIS — I5023 Acute on chronic systolic (congestive) heart failure: Secondary | ICD-10-CM | POA: Diagnosis not present

## 2021-04-18 DIAGNOSIS — R0989 Other specified symptoms and signs involving the circulatory and respiratory systems: Secondary | ICD-10-CM | POA: Diagnosis not present

## 2021-04-19 DIAGNOSIS — Z431 Encounter for attention to gastrostomy: Secondary | ICD-10-CM | POA: Diagnosis not present

## 2021-04-19 DIAGNOSIS — R278 Other lack of coordination: Secondary | ICD-10-CM | POA: Diagnosis not present

## 2021-04-19 DIAGNOSIS — I5023 Acute on chronic systolic (congestive) heart failure: Secondary | ICD-10-CM | POA: Diagnosis not present

## 2021-04-19 DIAGNOSIS — N400 Enlarged prostate without lower urinary tract symptoms: Secondary | ICD-10-CM | POA: Diagnosis not present

## 2021-04-19 DIAGNOSIS — F419 Anxiety disorder, unspecified: Secondary | ICD-10-CM | POA: Diagnosis not present

## 2021-04-19 DIAGNOSIS — J9620 Acute and chronic respiratory failure, unspecified whether with hypoxia or hypercapnia: Secondary | ICD-10-CM | POA: Diagnosis not present

## 2021-04-22 DIAGNOSIS — Z430 Encounter for attention to tracheostomy: Secondary | ICD-10-CM | POA: Diagnosis not present

## 2021-04-22 DIAGNOSIS — N400 Enlarged prostate without lower urinary tract symptoms: Secondary | ICD-10-CM | POA: Diagnosis not present

## 2021-04-22 DIAGNOSIS — Z9911 Dependence on respirator [ventilator] status: Secondary | ICD-10-CM | POA: Diagnosis not present

## 2021-04-22 DIAGNOSIS — R627 Adult failure to thrive: Secondary | ICD-10-CM | POA: Diagnosis not present

## 2021-04-22 DIAGNOSIS — J95811 Postprocedural pneumothorax: Secondary | ICD-10-CM | POA: Diagnosis not present

## 2021-04-22 DIAGNOSIS — R278 Other lack of coordination: Secondary | ICD-10-CM | POA: Diagnosis not present

## 2021-04-22 DIAGNOSIS — M6281 Muscle weakness (generalized): Secondary | ICD-10-CM | POA: Diagnosis not present

## 2021-04-22 DIAGNOSIS — I739 Peripheral vascular disease, unspecified: Secondary | ICD-10-CM | POA: Diagnosis not present

## 2021-04-22 DIAGNOSIS — G4733 Obstructive sleep apnea (adult) (pediatric): Secondary | ICD-10-CM | POA: Diagnosis not present

## 2021-04-22 DIAGNOSIS — R1310 Dysphagia, unspecified: Secondary | ICD-10-CM | POA: Diagnosis not present

## 2021-04-22 DIAGNOSIS — F419 Anxiety disorder, unspecified: Secondary | ICD-10-CM | POA: Diagnosis not present

## 2021-04-22 DIAGNOSIS — I5023 Acute on chronic systolic (congestive) heart failure: Secondary | ICD-10-CM | POA: Diagnosis not present

## 2021-04-22 DIAGNOSIS — Z431 Encounter for attention to gastrostomy: Secondary | ICD-10-CM | POA: Diagnosis not present

## 2021-04-22 DIAGNOSIS — G894 Chronic pain syndrome: Secondary | ICD-10-CM | POA: Diagnosis not present

## 2021-04-22 DIAGNOSIS — J9620 Acute and chronic respiratory failure, unspecified whether with hypoxia or hypercapnia: Secondary | ICD-10-CM | POA: Diagnosis not present

## 2021-04-22 DIAGNOSIS — F32A Depression, unspecified: Secondary | ICD-10-CM | POA: Diagnosis not present

## 2021-04-23 DIAGNOSIS — I5023 Acute on chronic systolic (congestive) heart failure: Secondary | ICD-10-CM | POA: Diagnosis not present

## 2021-04-23 DIAGNOSIS — Z431 Encounter for attention to gastrostomy: Secondary | ICD-10-CM | POA: Diagnosis not present

## 2021-04-23 DIAGNOSIS — J9620 Acute and chronic respiratory failure, unspecified whether with hypoxia or hypercapnia: Secondary | ICD-10-CM | POA: Diagnosis not present

## 2021-04-23 DIAGNOSIS — R278 Other lack of coordination: Secondary | ICD-10-CM | POA: Diagnosis not present

## 2021-04-23 DIAGNOSIS — F419 Anxiety disorder, unspecified: Secondary | ICD-10-CM | POA: Diagnosis not present

## 2021-04-23 DIAGNOSIS — N400 Enlarged prostate without lower urinary tract symptoms: Secondary | ICD-10-CM | POA: Diagnosis not present

## 2021-04-24 DIAGNOSIS — Z431 Encounter for attention to gastrostomy: Secondary | ICD-10-CM | POA: Diagnosis not present

## 2021-04-24 DIAGNOSIS — R278 Other lack of coordination: Secondary | ICD-10-CM | POA: Diagnosis not present

## 2021-04-24 DIAGNOSIS — N400 Enlarged prostate without lower urinary tract symptoms: Secondary | ICD-10-CM | POA: Diagnosis not present

## 2021-04-24 DIAGNOSIS — F419 Anxiety disorder, unspecified: Secondary | ICD-10-CM | POA: Diagnosis not present

## 2021-04-24 DIAGNOSIS — I5023 Acute on chronic systolic (congestive) heart failure: Secondary | ICD-10-CM | POA: Diagnosis not present

## 2021-04-24 DIAGNOSIS — J9620 Acute and chronic respiratory failure, unspecified whether with hypoxia or hypercapnia: Secondary | ICD-10-CM | POA: Diagnosis not present

## 2021-04-25 DIAGNOSIS — I5023 Acute on chronic systolic (congestive) heart failure: Secondary | ICD-10-CM | POA: Diagnosis not present

## 2021-04-25 DIAGNOSIS — F419 Anxiety disorder, unspecified: Secondary | ICD-10-CM | POA: Diagnosis not present

## 2021-04-25 DIAGNOSIS — Z431 Encounter for attention to gastrostomy: Secondary | ICD-10-CM | POA: Diagnosis not present

## 2021-04-25 DIAGNOSIS — J9620 Acute and chronic respiratory failure, unspecified whether with hypoxia or hypercapnia: Secondary | ICD-10-CM | POA: Diagnosis not present

## 2021-04-25 DIAGNOSIS — N400 Enlarged prostate without lower urinary tract symptoms: Secondary | ICD-10-CM | POA: Diagnosis not present

## 2021-04-25 DIAGNOSIS — R278 Other lack of coordination: Secondary | ICD-10-CM | POA: Diagnosis not present

## 2021-04-28 ENCOUNTER — Encounter (HOSPITAL_COMMUNITY): Payer: Self-pay | Admitting: Emergency Medicine

## 2021-04-28 ENCOUNTER — Emergency Department (HOSPITAL_COMMUNITY): Payer: Medicare Other

## 2021-04-28 ENCOUNTER — Emergency Department (HOSPITAL_BASED_OUTPATIENT_CLINIC_OR_DEPARTMENT_OTHER)
Admission: EM | Admit: 2021-04-28 | Discharge: 2021-04-29 | Disposition: A | Discharge status: Skilled Nursing Facility | Payer: Medicare Other | Source: Home / Self Care | Attending: Emergency Medicine | Admitting: Emergency Medicine

## 2021-04-28 DIAGNOSIS — Z430 Encounter for attention to tracheostomy: Secondary | ICD-10-CM | POA: Diagnosis not present

## 2021-04-28 DIAGNOSIS — E039 Hypothyroidism, unspecified: Secondary | ICD-10-CM | POA: Diagnosis present

## 2021-04-28 DIAGNOSIS — J9611 Chronic respiratory failure with hypoxia: Secondary | ICD-10-CM | POA: Diagnosis not present

## 2021-04-28 DIAGNOSIS — R251 Tremor, unspecified: Secondary | ICD-10-CM | POA: Insufficient documentation

## 2021-04-28 DIAGNOSIS — Z66 Do not resuscitate: Secondary | ICD-10-CM | POA: Diagnosis not present

## 2021-04-28 DIAGNOSIS — G988 Other disorders of nervous system: Secondary | ICD-10-CM | POA: Diagnosis not present

## 2021-04-28 DIAGNOSIS — F419 Anxiety disorder, unspecified: Secondary | ICD-10-CM | POA: Diagnosis not present

## 2021-04-28 DIAGNOSIS — R0602 Shortness of breath: Secondary | ICD-10-CM | POA: Insufficient documentation

## 2021-04-28 DIAGNOSIS — B9689 Other specified bacterial agents as the cause of diseases classified elsewhere: Secondary | ICD-10-CM | POA: Insufficient documentation

## 2021-04-28 DIAGNOSIS — Z79890 Hormone replacement therapy (postmenopausal): Secondary | ICD-10-CM | POA: Diagnosis not present

## 2021-04-28 DIAGNOSIS — G250 Essential tremor: Secondary | ICD-10-CM | POA: Diagnosis present

## 2021-04-28 DIAGNOSIS — Z9911 Dependence on respirator [ventilator] status: Secondary | ICD-10-CM | POA: Diagnosis not present

## 2021-04-28 DIAGNOSIS — R000 Tachycardia, unspecified: Secondary | ICD-10-CM | POA: Insufficient documentation

## 2021-04-28 DIAGNOSIS — N400 Enlarged prostate without lower urinary tract symptoms: Secondary | ICD-10-CM | POA: Diagnosis not present

## 2021-04-28 DIAGNOSIS — Z86718 Personal history of other venous thrombosis and embolism: Secondary | ICD-10-CM | POA: Diagnosis not present

## 2021-04-28 DIAGNOSIS — I639 Cerebral infarction, unspecified: Secondary | ICD-10-CM | POA: Diagnosis not present

## 2021-04-28 DIAGNOSIS — I480 Paroxysmal atrial fibrillation: Secondary | ICD-10-CM | POA: Diagnosis not present

## 2021-04-28 DIAGNOSIS — I5023 Acute on chronic systolic (congestive) heart failure: Secondary | ICD-10-CM | POA: Diagnosis not present

## 2021-04-28 DIAGNOSIS — R4182 Altered mental status, unspecified: Secondary | ICD-10-CM | POA: Diagnosis not present

## 2021-04-28 DIAGNOSIS — Z930 Tracheostomy status: Secondary | ICD-10-CM | POA: Diagnosis not present

## 2021-04-28 DIAGNOSIS — N390 Urinary tract infection, site not specified: Secondary | ICD-10-CM | POA: Diagnosis not present

## 2021-04-28 DIAGNOSIS — J3489 Other specified disorders of nose and nasal sinuses: Secondary | ICD-10-CM | POA: Insufficient documentation

## 2021-04-28 DIAGNOSIS — I10 Essential (primary) hypertension: Secondary | ICD-10-CM | POA: Diagnosis not present

## 2021-04-28 DIAGNOSIS — R519 Headache, unspecified: Secondary | ICD-10-CM | POA: Insufficient documentation

## 2021-04-28 DIAGNOSIS — J449 Chronic obstructive pulmonary disease, unspecified: Secondary | ICD-10-CM | POA: Diagnosis present

## 2021-04-28 DIAGNOSIS — R569 Unspecified convulsions: Secondary | ICD-10-CM

## 2021-04-28 DIAGNOSIS — G4089 Other seizures: Secondary | ICD-10-CM | POA: Diagnosis not present

## 2021-04-28 DIAGNOSIS — Z823 Family history of stroke: Secondary | ICD-10-CM | POA: Diagnosis not present

## 2021-04-28 DIAGNOSIS — L89896 Pressure-induced deep tissue damage of other site: Secondary | ICD-10-CM | POA: Diagnosis present

## 2021-04-28 DIAGNOSIS — Z20822 Contact with and (suspected) exposure to covid-19: Secondary | ICD-10-CM | POA: Diagnosis not present

## 2021-04-28 DIAGNOSIS — Z681 Body mass index (BMI) 19 or less, adult: Secondary | ICD-10-CM | POA: Diagnosis not present

## 2021-04-28 DIAGNOSIS — R627 Adult failure to thrive: Secondary | ICD-10-CM | POA: Diagnosis present

## 2021-04-28 DIAGNOSIS — I5042 Chronic combined systolic (congestive) and diastolic (congestive) heart failure: Secondary | ICD-10-CM | POA: Diagnosis present

## 2021-04-28 DIAGNOSIS — E441 Mild protein-calorie malnutrition: Secondary | ICD-10-CM | POA: Diagnosis not present

## 2021-04-28 DIAGNOSIS — I482 Chronic atrial fibrillation, unspecified: Secondary | ICD-10-CM | POA: Diagnosis not present

## 2021-04-28 DIAGNOSIS — I2510 Atherosclerotic heart disease of native coronary artery without angina pectoris: Secondary | ICD-10-CM | POA: Diagnosis present

## 2021-04-28 DIAGNOSIS — L89151 Pressure ulcer of sacral region, stage 1: Secondary | ICD-10-CM | POA: Diagnosis present

## 2021-04-28 DIAGNOSIS — I9589 Other hypotension: Secondary | ICD-10-CM | POA: Diagnosis present

## 2021-04-28 DIAGNOSIS — J9620 Acute and chronic respiratory failure, unspecified whether with hypoxia or hypercapnia: Secondary | ICD-10-CM | POA: Diagnosis not present

## 2021-04-28 DIAGNOSIS — G4733 Obstructive sleep apnea (adult) (pediatric): Secondary | ICD-10-CM | POA: Diagnosis present

## 2021-04-28 DIAGNOSIS — Z431 Encounter for attention to gastrostomy: Secondary | ICD-10-CM | POA: Diagnosis not present

## 2021-04-28 DIAGNOSIS — R531 Weakness: Secondary | ICD-10-CM | POA: Diagnosis not present

## 2021-04-28 DIAGNOSIS — J9811 Atelectasis: Secondary | ICD-10-CM | POA: Diagnosis not present

## 2021-04-28 DIAGNOSIS — R278 Other lack of coordination: Secondary | ICD-10-CM | POA: Diagnosis not present

## 2021-04-28 DIAGNOSIS — Z8249 Family history of ischemic heart disease and other diseases of the circulatory system: Secondary | ICD-10-CM | POA: Diagnosis not present

## 2021-04-28 LAB — CBC WITH DIFFERENTIAL/PLATELET
Abs Immature Granulocytes: 0.07 10*3/uL (ref 0.00–0.07)
Basophils Absolute: 0 10*3/uL (ref 0.0–0.1)
Basophils Relative: 0 %
Eosinophils Absolute: 0.1 10*3/uL (ref 0.0–0.5)
Eosinophils Relative: 1 %
HCT: 36.3 % — ABNORMAL LOW (ref 39.0–52.0)
Hemoglobin: 11.7 g/dL — ABNORMAL LOW (ref 13.0–17.0)
Immature Granulocytes: 0 %
Lymphocytes Relative: 3 %
Lymphs Abs: 0.5 10*3/uL — ABNORMAL LOW (ref 0.7–4.0)
MCH: 29 pg (ref 26.0–34.0)
MCHC: 32.2 g/dL (ref 30.0–36.0)
MCV: 89.9 fL (ref 80.0–100.0)
Monocytes Absolute: 1.1 10*3/uL — ABNORMAL HIGH (ref 0.1–1.0)
Monocytes Relative: 7 %
Neutro Abs: 13.9 10*3/uL — ABNORMAL HIGH (ref 1.7–7.7)
Neutrophils Relative %: 89 %
Platelets: 354 10*3/uL (ref 150–400)
RBC: 4.04 MIL/uL — ABNORMAL LOW (ref 4.22–5.81)
RDW: 16.3 % — ABNORMAL HIGH (ref 11.5–15.5)
WBC: 15.7 10*3/uL — ABNORMAL HIGH (ref 4.0–10.5)
nRBC: 0 % (ref 0.0–0.2)

## 2021-04-28 LAB — COMPREHENSIVE METABOLIC PANEL
ALT: 33 U/L (ref 0–44)
AST: 26 U/L (ref 15–41)
Albumin: 3.1 g/dL — ABNORMAL LOW (ref 3.5–5.0)
Alkaline Phosphatase: 134 U/L — ABNORMAL HIGH (ref 38–126)
Anion gap: 11 (ref 5–15)
BUN: 22 mg/dL (ref 8–23)
CO2: 29 mmol/L (ref 22–32)
Calcium: 9.5 mg/dL (ref 8.9–10.3)
Chloride: 96 mmol/L — ABNORMAL LOW (ref 98–111)
Creatinine, Ser: 0.53 mg/dL — ABNORMAL LOW (ref 0.61–1.24)
GFR, Estimated: >60 mL/min (ref >60)
Glucose, Bld: 122 mg/dL — ABNORMAL HIGH (ref 70–99)
Potassium: 4.3 mmol/L (ref 3.5–5.1)
Sodium: 136 mmol/L (ref 135–145)
Total Bilirubin: 0.4 mg/dL (ref 0.3–1.2)
Total Protein: 7.3 g/dL (ref 6.5–8.1)

## 2021-04-28 LAB — URINALYSIS, ROUTINE W REFLEX MICROSCOPIC
Bacteria, UA: NONE SEEN
Bilirubin Urine: NEGATIVE
Glucose, UA: NEGATIVE mg/dL
Hgb urine dipstick: NEGATIVE
Ketones, ur: NEGATIVE mg/dL
Nitrite: NEGATIVE
Protein, ur: 30 mg/dL — AB
Specific Gravity, Urine: 1.015 (ref 1.005–1.030)
WBC, UA: >50 WBC/hpf — ABNORMAL HIGH (ref 0–5)
pH: 8 (ref 5.0–8.0)

## 2021-04-28 LAB — AMMONIA: Ammonia: 24 umol/L (ref 9–35)

## 2021-04-28 LAB — LACTIC ACID, PLASMA
Lactic Acid, Venous: 2.5 mmol/L (ref 0.5–1.9)
Lactic Acid, Venous: 1.4 mmol/L (ref 0.5–1.9)

## 2021-04-28 IMAGING — CT CT HEAD W/O CM
3 series · 16 of 47 positions shown, 19 images · non-contrast
Comparison: CT brain [DATE]

CLINICAL DATA: Mental status change



[Series 3: head 5.0 h30s · axial · 0.44mm/px · z∈[-160,+0]mm · 10 of 38 slices shown, 13 images]
[im 3/38  brain]
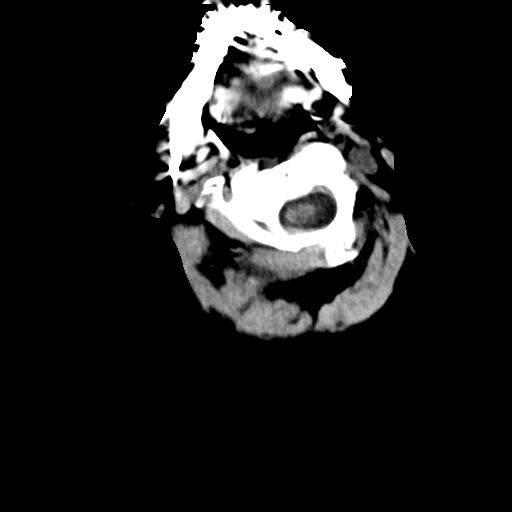
[im 3/38  bone]
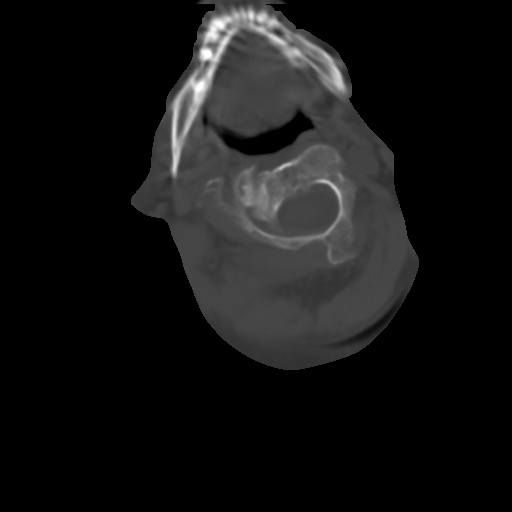
[im 7/38  brain]
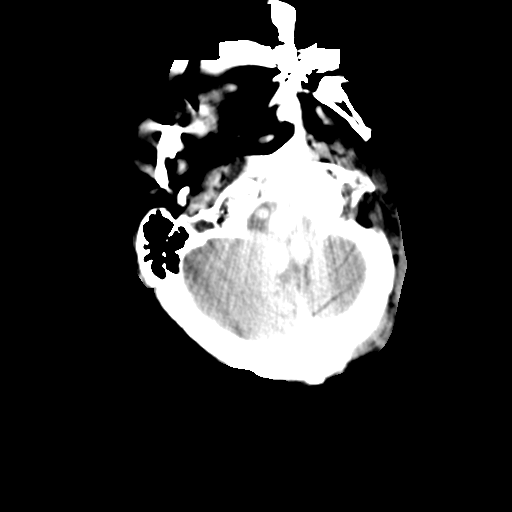
[im 11/38  brain]
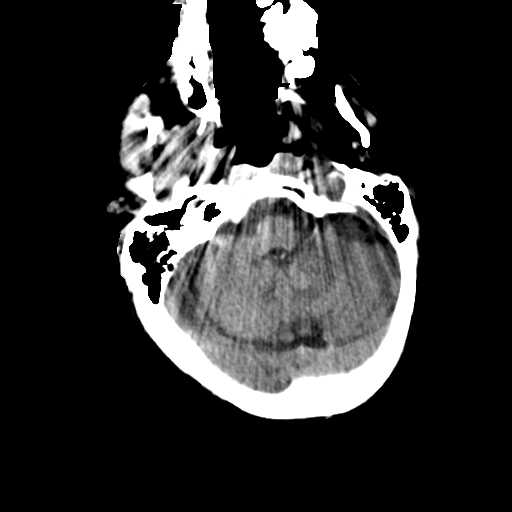
[im 13/38  brain]
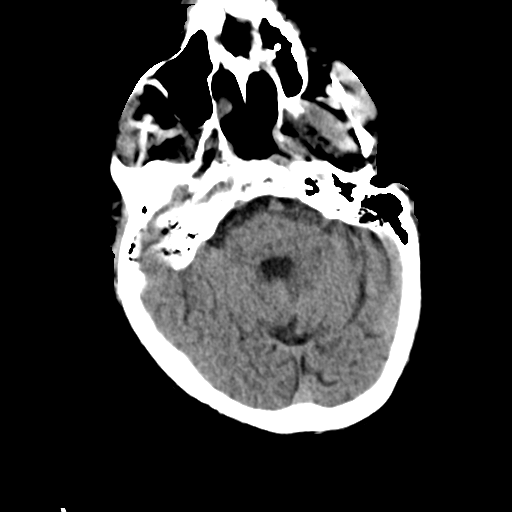
[im 17/38  brain]
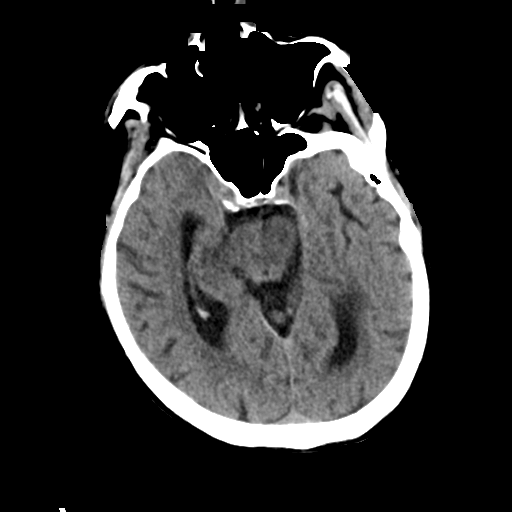
[im 17/38  bone]
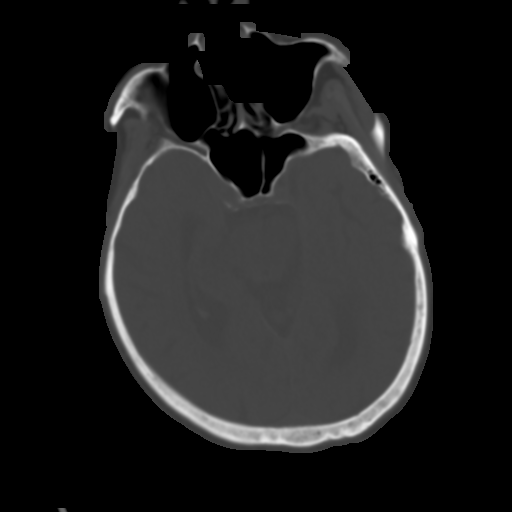
[im 21/38  brain]
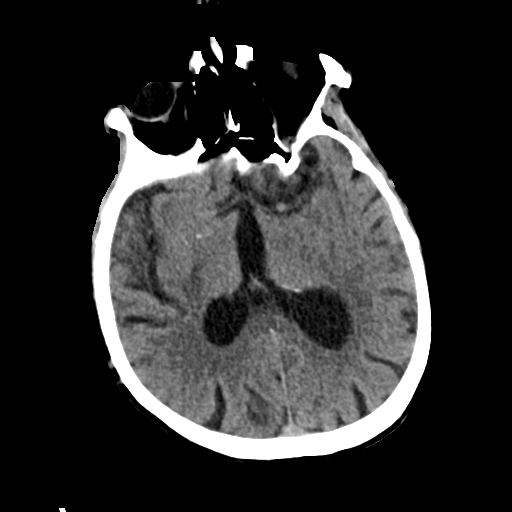
[im 25/38  brain]
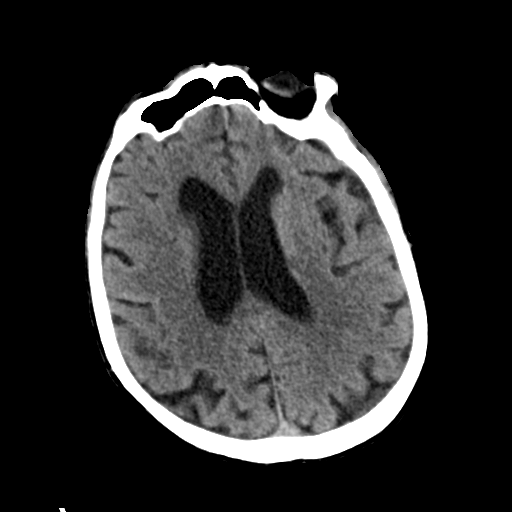
[im 29/38  brain]
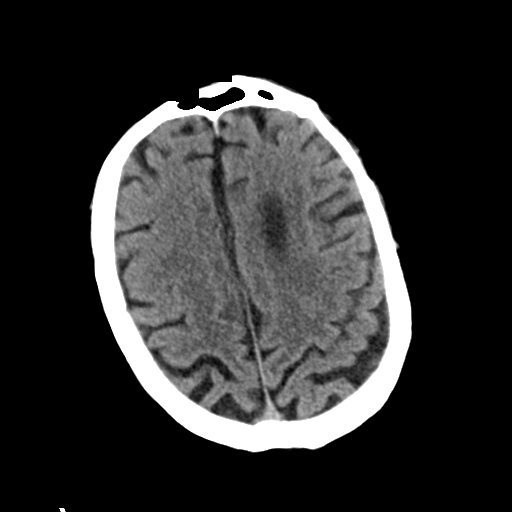
[im 31/38  brain]
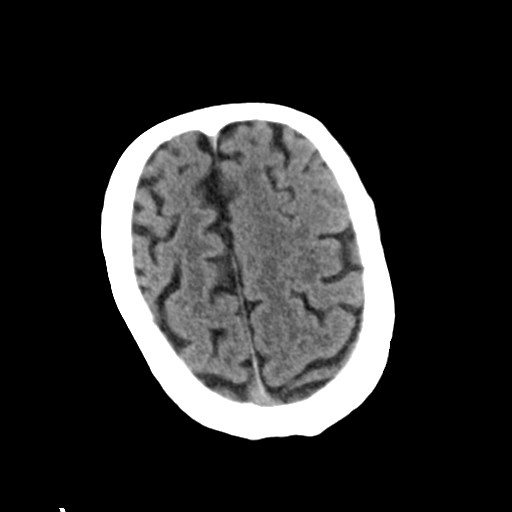
[im 31/38  bone]
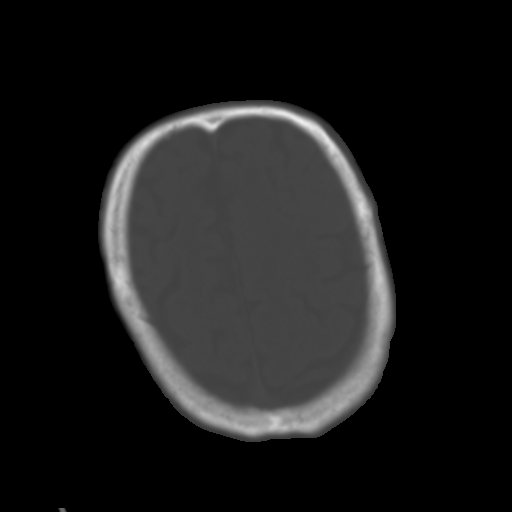
[im 35/38  brain]
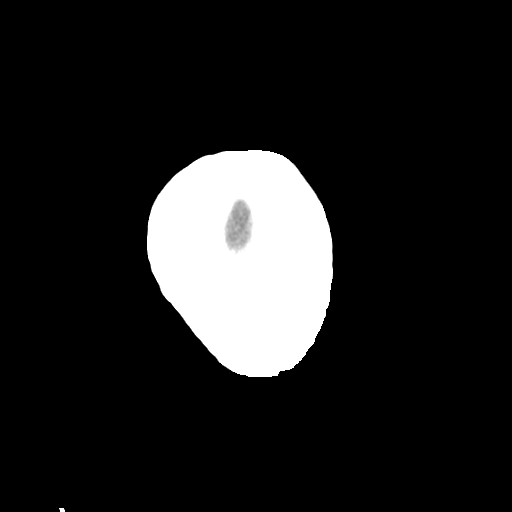

[Series 5: head 3.0 mpr cor · coronal · 0.38mm/px · 3 of 70 slices shown]
[im 24/70  brain]
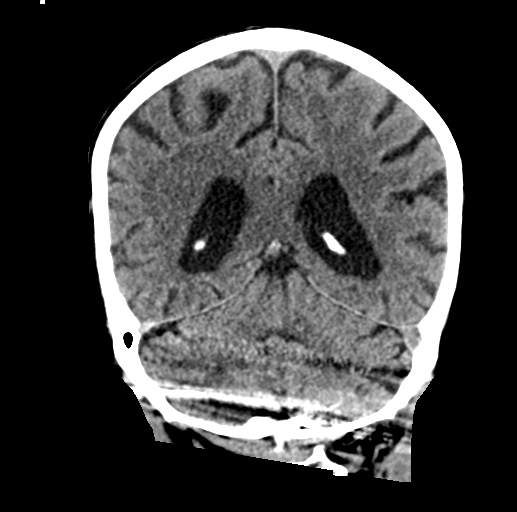
[im 31/70  brain]
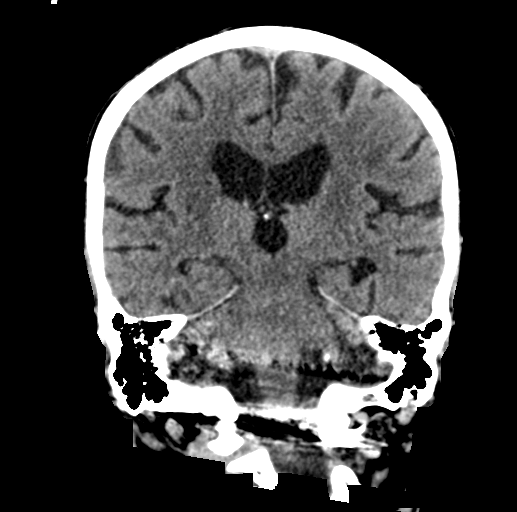
[im 39/70  brain]
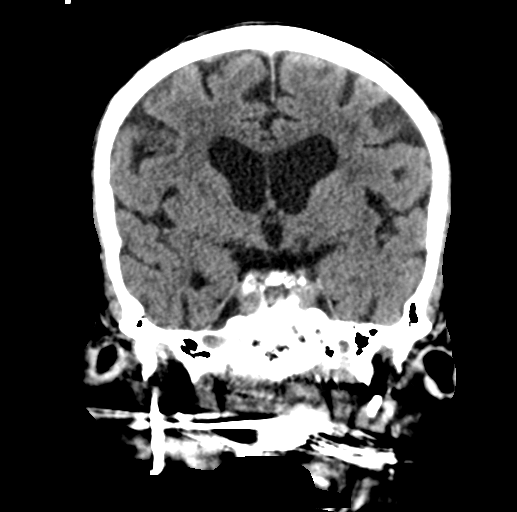

[Series 6: head 3.0 mpr sag · sagittal · 0.38mm/px · 3 of 65 slices shown]
[im 26/65  brain]
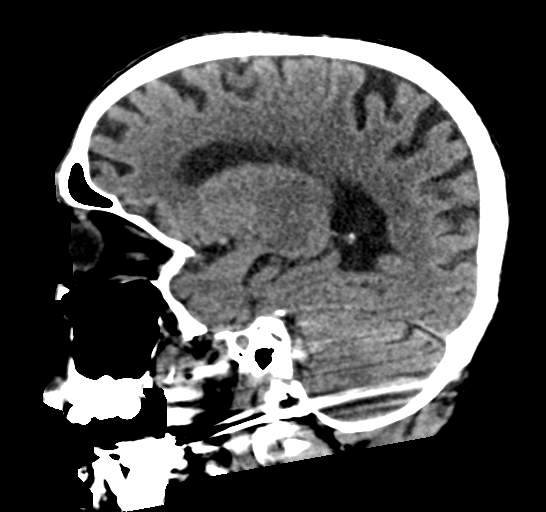
[im 32/65  brain]
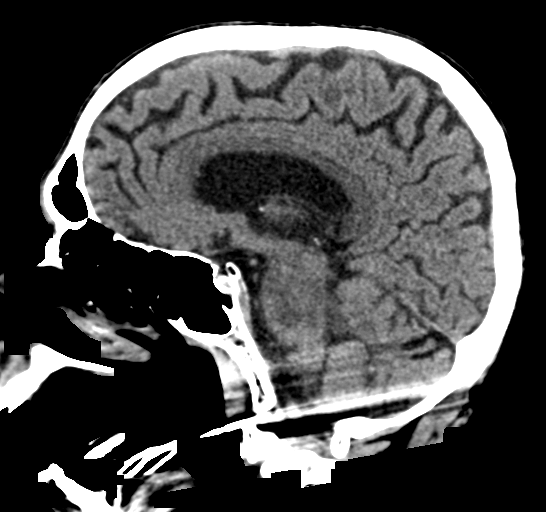
[im 38/65  brain]
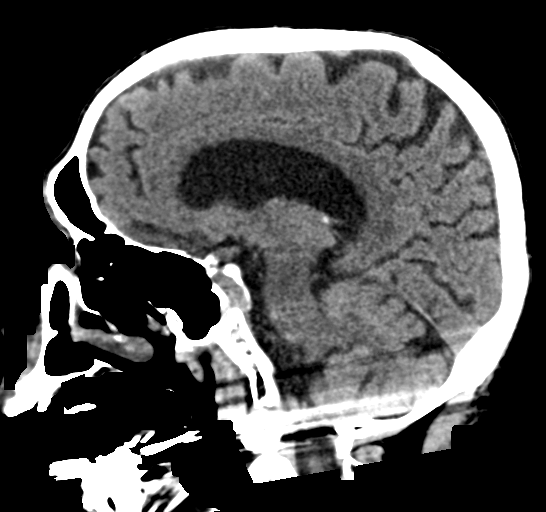

[16 of 47 positions shown; findings below may reference images not displayed]

FINDINGS: Brain: No acute territorial infarction, hemorrhage, or intracranial
mass. Mild parenchymal atrophy and chronic small vessel ischemic
changes of the white matter. Stable ventricle size and morphology.

Vascular: No hyperdense vessels. Scattered carotid vascular
calcification

Skull: Normal. Negative for fracture or focal lesion.

Sinuses/Orbits: No acute finding.

Other: Chronic nasal bone deformity
IMPRESSION: 1. No CT evidence for acute intracranial abnormality.
2. Atrophy and chronic small vessel ischemic changes of the white
matter

## 2021-04-28 IMAGING — DX DG CHEST 1V PORT
1 series · 1 of 1 positions shown · non-contrast
Comparison: Chest x-ray [DATE], chest x-ray [DATE]

CLINICAL DATA: weakness

EXAM:
PORTABLE CHEST 1 VIEW

[chest]
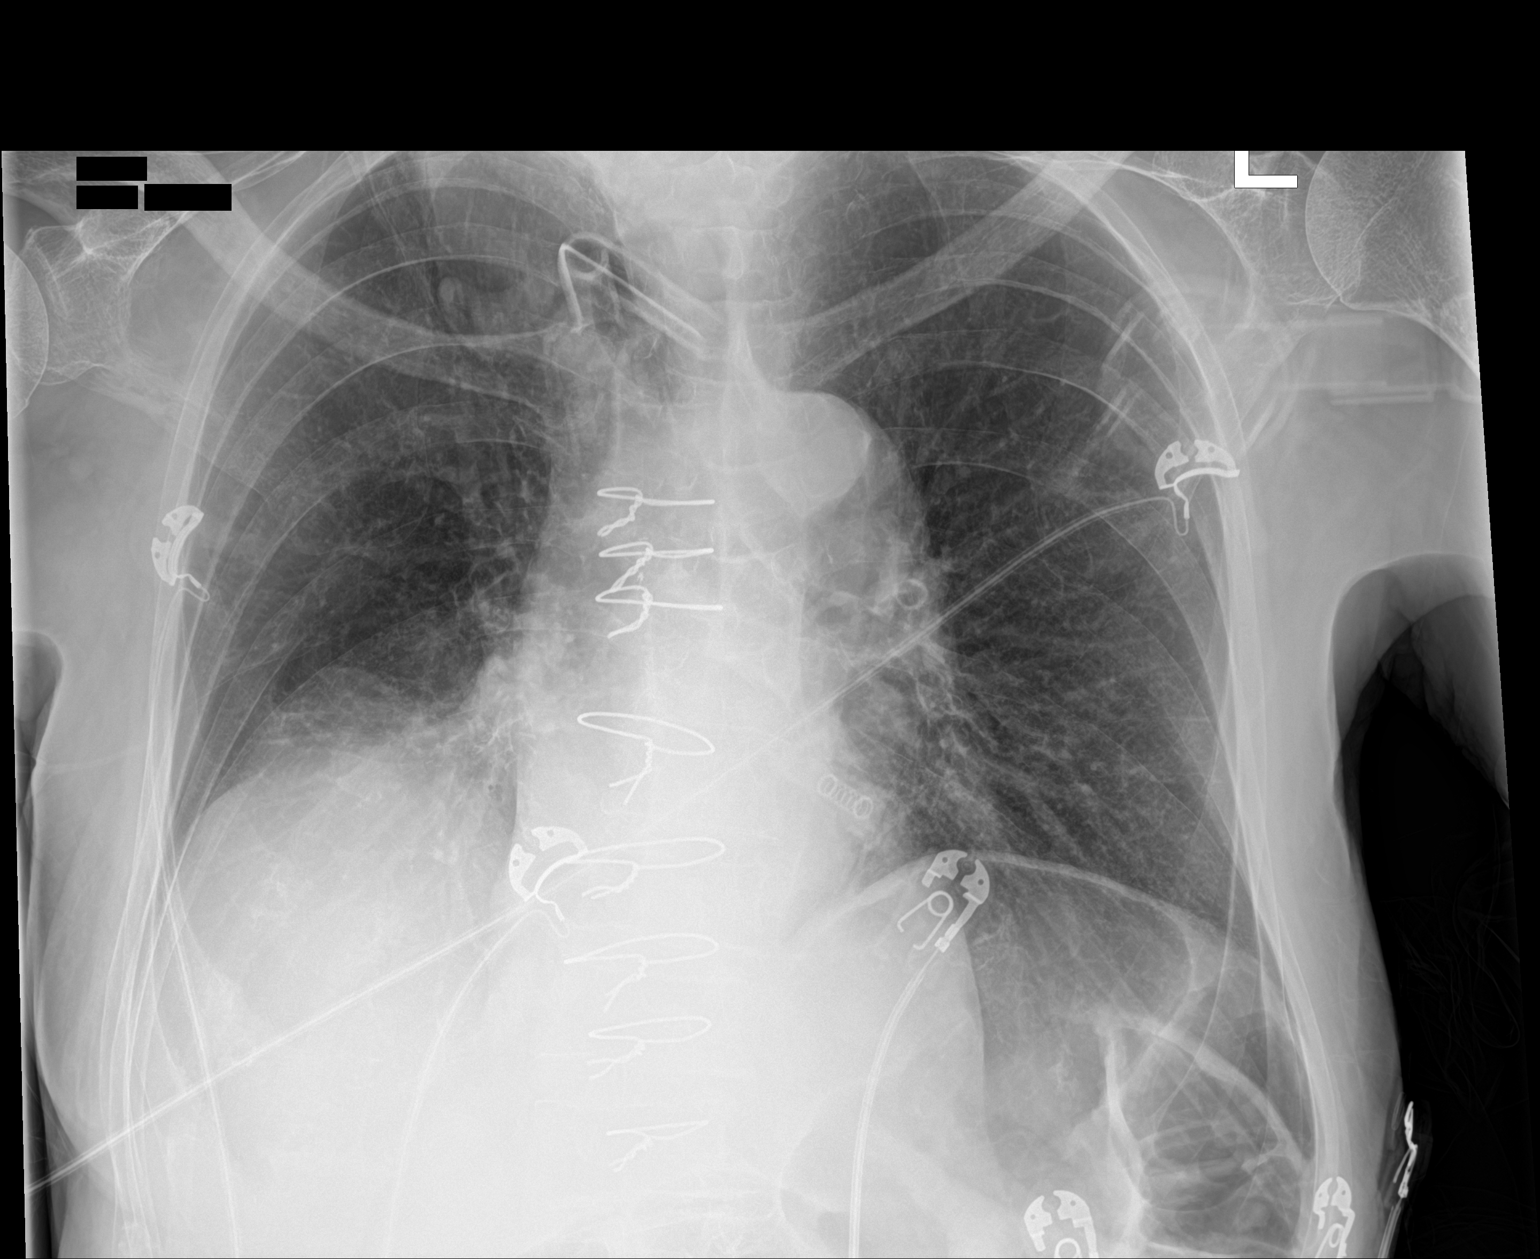

[1 of 1 positions shown; findings below may reference images not displayed]

FINDINGS: Tracheostomy.

The heart and mediastinal contours are unchanged. Aortic
calcification.

No focal consolidation. Coarsened markings with no overt pulmonary
edema. No pleural effusion. No pneumothorax.

No acute osseous abnormality.
IMPRESSION: 1. No active disease.
2.  Aortic Atherosclerosis ([HT]-[HT]).

## 2021-04-28 MED ORDER — LEVETIRACETAM 500 MG PO TABS
500.0000 mg | ORAL_TABLET | Freq: Two times a day (BID) | ORAL | 0 refills | Status: DC
Start: 2021-04-28 — End: 2021-04-29

## 2021-04-28 MED ORDER — SODIUM CHLORIDE 0.9 % IV BOLUS
500.0000 mL | Freq: Once | INTRAVENOUS | Status: AC
  Administered 2021-04-28: 500 mL via INTRAVENOUS

## 2021-04-28 MED ORDER — LEVETIRACETAM IN NACL 1500 MG/100ML IV SOLN
1500.0000 mg | Freq: Once | INTRAVENOUS | Status: AC
  Administered 2021-04-28: 1500 mg via INTRAVENOUS
  Filled 2021-04-28: qty 100

## 2021-04-28 MED ORDER — SODIUM CHLORIDE 0.9 % IV BOLUS
1000.0000 mL | Freq: Once | INTRAVENOUS | Status: AC
Start: 2021-04-28 — End: 2021-04-29
  Administered 2021-04-28: 1000 mL via INTRAVENOUS

## 2021-04-28 MED ORDER — LEVETIRACETAM 500 MG PO TABS
500.0000 mg | ORAL_TABLET | Freq: Two times a day (BID) | ORAL | 0 refills | Status: DC
Start: 2021-04-28 — End: 2021-04-28

## 2021-04-28 NOTE — ED Notes (Signed)
Pt urinary incontinent in disposable brief. Cleaned and changed into clean brief by this RN and Alexa, NT. Primofit external catheter placed on pt.  ?

## 2021-04-28 NOTE — ED Notes (Signed)
Daughter Terius Jacuinde [phone] would like an update ?

## 2021-04-28 NOTE — ED Triage Notes (Addendum)
Pt bib [facility] from [facility] for suspected focal seizures due to "staring off" noted by [facility] staff. VSS, baseline tremor to face and arms. Vented with tracheostomy.  ?

## 2021-04-28 NOTE — ED Provider Notes (Signed)
?[facility] ?Provider Note ? ? ?CSN: [id number] ?Arrival date & time: 04/28/21  2040 ? ?  ? ?History ? ?Chief Complaint  ?Patient presents with  ? Suspected Seizure  ? ? ?Austin Davis is a 81 y.o. male.  Level 5 caveat secondary to communication difficulties, tracheostomy.  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  Also has a history of A-fib not on anticoagulation secondary to GI bleed.  Per transfer team staff noticed few episodes of staring to one side and not interacting per usual.  Patient baseline has tremors of face and arms, points to board with words on it to communicate.  He endorses headache to me and some mild shortness of breath. ? ?The history is provided by the patient and the EMS personnel. The history is limited by the condition of the patient.  ?Seizures ?Seizure activity on arrival: no   ?Seizure type:  Unable to specify ?Initial focality:  Unable to specify ?Episode characteristics: eye deviation   ?Return to baseline: no   ?Progression:  Resolved ?Context: not fever   ?Recent head injury:  No recent head injuries ?PTA treatment:  None ?History of seizures: no   ? ?  ? ?Home Medications ?Prior to Admission medications   ?Medication Sig Start Date End Date Taking? Authorizing Provider  ?acetaminophen (TYLENOL) 650 MG CR tablet Take 650 mg by mouth every 8 (eight) hours as needed for pain. Via tube    [provider]  ?ALPRAZolam Duanne Moron) 0.5 MG tablet Place 1 tablet (0.5 mg total) into feeding tube every 8 (eight) hours as needed for anxiety. 04/16/21   [name], DO  ?Amino Acids-Protein Hydrolys (FEEDING SUPPLEMENT, PRO-STAT SUGAR FREE 64,) LIQD Place 30 mLs into feeding tube 2 (two) times daily.    [provider]  ?Cholecalciferol (VITAMIN D3 SUPER STRENGTH) 50 MCG (2000 UT) TABS Give 2,000 Units by tube daily.    [provider]  ?clonazePAM (KLONOPIN) 0.5 MG tablet Place 1  tablet (0.5 mg total) into feeding tube 2 (two) times daily. 04/16/21   [name], DO  ?Docosahexaenoic Acid (DHA OMEGA 3 PO) Place 1 capsule into feeding tube daily.    [provider]  ?doxazosin (CARDURA) 2 MG tablet Place 1 tablet (2 mg total) into feeding tube daily. 04/17/21   [name], DO  ?escitalopram (LEXAPRO) 10 MG tablet Place 10 mg into feeding tube daily.    [provider]  ?esomeprazole (NEXIUM) 40 MG packet Take 40 mg by mouth daily. Per G tube    [provider]  ?fexofenadine (ALLEGRA) 180 MG tablet Place 180 mg into feeding tube daily.    [provider]  ?folic acid (FOLVITE) 1 MG tablet Take 1 tablet (1 mg total) by mouth daily. ?Patient taking differently: Place 1 mg into feeding tube daily. 09/29/20   [name], MD  ?furosemide (LASIX) 20 MG tablet Place 1 tablet (20 mg total) into feeding tube daily as needed for fluid or edema. 04/16/21   [name], DO  ?guaiFENesin (ROBITUSSIN) 100 MG/5ML liquid Place 20 mLs into feeding tube 3 (three) times daily.    [provider]  ?hydrOXYzine (ATARAX) 25 MG tablet Place 25 mg into feeding tube every 6 (six) hours as needed for anxiety.    [provider]  ?ipratropium-albuterol (DUONEB) 0.5-2.5 (3) MG/3ML SOLN Take 3 mLs by nebulization every 6 (six) hours  as needed (shortness of breath).    [provider]  ?Lactobacillus Rhamnosus, GG, (CULTURELLE) CAPS Give 10 Billion Cells by tube daily.    [provider]  ?lactulose (CHRONULAC) 10 GM/15ML solution Place 20 g into feeding tube 2 (two) times daily as needed for mild constipation.    [provider]  ?levalbuterol (XOPENEX) 1.25 MG/3ML nebulizer solution Take 1.25 mg by nebulization every 6 (six) hours. For 30 days starting on 03-23-21    [provider]  ?levothyroxine (SYNTHROID) 25 MCG tablet Place 25 mcg into feeding tube daily before breakfast.    [provider]   ?midodrine (PROAMATINE) 5 MG tablet Take 1 tablet (5 mg total) by mouth 3 (three) times daily with meals. ?Patient taking differently: Place 5 mg into feeding tube 3 (three) times daily. 09/28/20   [name], MD  ?Multiple Vitamin (MULTIVITAMIN WITH MINERALS) TABS tablet Take 1 tablet by mouth daily. ?Patient taking differently: Place 1 tablet into feeding tube daily. 09/29/20   [name], MD  ?Nutritional Supplements (JEVITY 1.5 CAL PO) Give 50 mLs by tube in the morning and at bedtime.    [provider]  ?polyethylene glycol powder (GLYCOLAX/MIRALAX) 17 GM/SCOOP powder Place 17 g into feeding tube every other day.    [provider]  ?sodium chloride (OCEAN) 0.65 % SOLN nasal spray Place 1 spray into both nostrils every 4 (four) hours as needed for congestion.    [provider]  ?vitamin C (ASCORBIC ACID) 500 MG tablet Place 500 mg into feeding tube daily.    [provider]  ?zinc gluconate 50 MG tablet Place 50 mg into feeding tube 2 (two) times daily.    [provider]  ?   ? ?Allergies    ?Prednisone and Cortisone   ? ?Review of Systems   ?Review of Systems  ?Constitutional:  Negative for fever.  ?Respiratory:  Positive for shortness of breath.   ?Cardiovascular:  Negative for chest pain.  ?Neurological:  Positive for seizures.  ? ?Physical Exam ?Updated Vital Signs ?BP 102/61 (BP Location: Left Arm)   Pulse (!) 109   Temp 99 ?F (37.2 ?C) (Axillary)   Resp 15   SpO2 97%  ?Physical Exam ?Vitals and nursing note reviewed.  ?Constitutional:   ?   General: He is not in acute distress. ?   Appearance: Normal appearance. He is well-developed.  ?HENT:  ?   Head: Normocephalic and atraumatic.  ?Eyes:  ?   Conjunctiva/sclera: Conjunctivae normal.  ?Cardiovascular:  ?   Rate and Rhythm: Regular rhythm. Tachycardia present.  ?   Heart sounds: No murmur heard. ?Pulmonary:  ?   Effort: Pulmonary effort is normal. No respiratory distress.  ?   Breath  sounds: Rhonchi present.  ?Abdominal:  ?   Palpations: Abdomen is soft.  ?   Tenderness: There is no abdominal tenderness. There is no guarding or rebound.  ?Musculoskeletal:     ?   General: No swelling.  ?   Cervical back: Neck supple.  ?Skin: ?   General: Skin is warm and dry.  ?   Capillary Refill: Capillary refill takes less than 2 seconds.  ?Neurological:  ?   General: No focal deficit present.  ?   Mental Status: He is alert.  ?   Comments: Patient has oral tremor and also tremor of both of his upper extremities.  He can follow commands.  ? ? ?ED Results / Procedures / Treatments   ?  Labs ?(all labs ordered are listed, but only abnormal results are displayed) ?Labs Reviewed  ?CBC WITH DIFFERENTIAL/PLATELET - Abnormal; Notable for the following components:  ?    Result Value  ? WBC 15.7 (*)   ? RBC 4.04 (*)   ? Hemoglobin 11.7 (*)   ? HCT 36.3 (*)   ? RDW 16.3 (*)   ? Neutro Abs 13.9 (*)   ? Lymphs Abs 0.5 (*)   ? Monocytes Absolute 1.1 (*)   ? All other components within normal limits  ?COMPREHENSIVE METABOLIC PANEL - Abnormal; Notable for the following components:  ? Chloride 96 (*)   ? Glucose, Bld 122 (*)   ? Creatinine, Ser 0.53 (*)   ? Albumin 3.1 (*)   ? Alkaline Phosphatase 134 (*)   ? All other components within normal limits  ?URINALYSIS, ROUTINE W REFLEX MICROSCOPIC - Abnormal; Notable for the following components:  ? Color, Urine AMBER (*)   ? APPearance CLOUDY (*)   ? Protein, ur 30 (*)   ? Leukocytes,Ua LARGE (*)   ? WBC, UA >50 (*)   ? All other components within normal limits  ?LACTIC ACID, PLASMA - Abnormal; Notable for the following components:  ? Lactic Acid, Venous 2.5 (*)   ? All other components within normal limits  ?CULTURE, BLOOD (ROUTINE X 2)  ?CULTURE, BLOOD (ROUTINE X 2)  ?AMMONIA  ?LACTIC ACID, PLASMA  ?CBG MONITORING, ED  ? ? ?EKG ?EKG Interpretation ? ?Date/Time:  Sunday April 28 2021 20:50:52 EDT ?Ventricular Rate:  114 ?PR Interval:  165 ?QRS Duration: 92 ?QT Interval:  322 ?QTC  Calculation: 444 ?R Axis:   74 ?Text Interpretation: Sinus tachycardia Borderline low voltage, extremity leads Partial missing lead(s): V3 increased rate from prior 3/23 Confirmed by Aletta Edouard [phone]) on 4/

## 2021-04-28 NOTE — Progress Notes (Signed)
ON CALL PHONE CONSULT ? ?Call from [email or web address]. [email or web address] ?Patient with prior tracheostomy, history of A-fib transferred from an outside facility with multiple episodes of staring and not interacting per usual.  Now back to baseline.  Is on primidone for essential tremor. ?Given multiple episodes of seizure-like activity, would recommend starting antiepileptics and have outpatient follow-up with his neurologist. ? ?Recommendations: ?Start Keppra after load ?Continue Primidone ?Follow with Dr Joline Maxcy at [facility] ? ?-- ?[facility], MD ?Neurologist ?[facility] ?Pager: [phone] ? ?

## 2021-04-28 NOTE — Discharge Instructions (Addendum)
You are seen in the emergency department for evaluation of a possible seizure.  You had blood work done along with a CAT scan of your head.  Neurology is recommending you start Keppra 500 mg twice a day and you were given an IV loading dose here in the emergency department.  Please follow-up with your outpatient neurologist.   ? ?During your work-up we noted that there was a urinary tract infection.  You were given first dose of antibiotics in the emergency department.  Keflex 500 3 times daily was prescribed. ? ?Return to the emergency department if any worsening or concerning symptoms. ?

## 2021-04-29 ENCOUNTER — Inpatient Hospital Stay (HOSPITAL_COMMUNITY)
Admission: EM | Admit: 2021-04-29 | Discharge: 2021-05-01 | DRG: 101 | Disposition: A | Discharge status: Skilled Nursing Facility | Payer: Medicare Other | Source: Skilled Nursing Facility | Attending: Internal Medicine | Admitting: Internal Medicine

## 2021-04-29 ENCOUNTER — Other Ambulatory Visit: Payer: Self-pay

## 2021-04-29 ENCOUNTER — Encounter (HOSPITAL_COMMUNITY): Payer: Self-pay | Admitting: Emergency Medicine

## 2021-04-29 ENCOUNTER — Emergency Department (HOSPITAL_COMMUNITY): Payer: Medicare Other

## 2021-04-29 DIAGNOSIS — Z8249 Family history of ischemic heart disease and other diseases of the circulatory system: Secondary | ICD-10-CM

## 2021-04-29 DIAGNOSIS — G4089 Other seizures: Principal | ICD-10-CM | POA: Diagnosis present

## 2021-04-29 DIAGNOSIS — I9589 Other hypotension: Secondary | ICD-10-CM | POA: Diagnosis present

## 2021-04-29 DIAGNOSIS — I5042 Chronic combined systolic (congestive) and diastolic (congestive) heart failure: Secondary | ICD-10-CM | POA: Diagnosis present

## 2021-04-29 DIAGNOSIS — Z66 Do not resuscitate: Secondary | ICD-10-CM | POA: Diagnosis present

## 2021-04-29 DIAGNOSIS — I482 Chronic atrial fibrillation, unspecified: Secondary | ICD-10-CM | POA: Diagnosis present

## 2021-04-29 DIAGNOSIS — L89896 Pressure-induced deep tissue damage of other site: Secondary | ICD-10-CM | POA: Diagnosis present

## 2021-04-29 DIAGNOSIS — E039 Hypothyroidism, unspecified: Secondary | ICD-10-CM | POA: Diagnosis present

## 2021-04-29 DIAGNOSIS — G4733 Obstructive sleep apnea (adult) (pediatric): Secondary | ICD-10-CM | POA: Diagnosis present

## 2021-04-29 DIAGNOSIS — Z820 Family history of epilepsy and other diseases of the nervous system: Secondary | ICD-10-CM

## 2021-04-29 DIAGNOSIS — J449 Chronic obstructive pulmonary disease, unspecified: Secondary | ICD-10-CM | POA: Diagnosis present

## 2021-04-29 DIAGNOSIS — J9611 Chronic respiratory failure with hypoxia: Secondary | ICD-10-CM | POA: Diagnosis present

## 2021-04-29 DIAGNOSIS — I2510 Atherosclerotic heart disease of native coronary artery without angina pectoris: Secondary | ICD-10-CM | POA: Diagnosis present

## 2021-04-29 DIAGNOSIS — Z20822 Contact with and (suspected) exposure to covid-19: Secondary | ICD-10-CM | POA: Diagnosis present

## 2021-04-29 DIAGNOSIS — Z681 Body mass index (BMI) 19 or less, adult: Secondary | ICD-10-CM

## 2021-04-29 DIAGNOSIS — Z823 Family history of stroke: Secondary | ICD-10-CM

## 2021-04-29 DIAGNOSIS — I10 Essential (primary) hypertension: Secondary | ICD-10-CM | POA: Diagnosis not present

## 2021-04-29 DIAGNOSIS — Z930 Tracheostomy status: Secondary | ICD-10-CM

## 2021-04-29 DIAGNOSIS — Z86718 Personal history of other venous thrombosis and embolism: Secondary | ICD-10-CM | POA: Diagnosis not present

## 2021-04-29 DIAGNOSIS — L8990 Pressure ulcer of unspecified site, unspecified stage: Secondary | ICD-10-CM | POA: Insufficient documentation

## 2021-04-29 DIAGNOSIS — N390 Urinary tract infection, site not specified: Secondary | ICD-10-CM | POA: Diagnosis present

## 2021-04-29 DIAGNOSIS — I480 Paroxysmal atrial fibrillation: Secondary | ICD-10-CM | POA: Diagnosis not present

## 2021-04-29 DIAGNOSIS — R569 Unspecified convulsions: Secondary | ICD-10-CM | POA: Diagnosis not present

## 2021-04-29 DIAGNOSIS — G988 Other disorders of nervous system: Secondary | ICD-10-CM | POA: Diagnosis not present

## 2021-04-29 DIAGNOSIS — Z9911 Dependence on respirator [ventilator] status: Secondary | ICD-10-CM | POA: Diagnosis not present

## 2021-04-29 DIAGNOSIS — Z808 Family history of malignant neoplasm of other organs or systems: Secondary | ICD-10-CM

## 2021-04-29 DIAGNOSIS — Z79890 Hormone replacement therapy (postmenopausal): Secondary | ICD-10-CM

## 2021-04-29 DIAGNOSIS — J9811 Atelectasis: Secondary | ICD-10-CM | POA: Diagnosis not present

## 2021-04-29 DIAGNOSIS — G250 Essential tremor: Secondary | ICD-10-CM | POA: Diagnosis present

## 2021-04-29 DIAGNOSIS — G40909 Epilepsy, unspecified, not intractable, without status epilepticus: Secondary | ICD-10-CM

## 2021-04-29 DIAGNOSIS — E441 Mild protein-calorie malnutrition: Secondary | ICD-10-CM | POA: Diagnosis present

## 2021-04-29 DIAGNOSIS — I639 Cerebral infarction, unspecified: Secondary | ICD-10-CM | POA: Diagnosis not present

## 2021-04-29 DIAGNOSIS — R627 Adult failure to thrive: Secondary | ICD-10-CM | POA: Diagnosis present

## 2021-04-29 DIAGNOSIS — L89151 Pressure ulcer of sacral region, stage 1: Secondary | ICD-10-CM | POA: Diagnosis present

## 2021-04-29 DIAGNOSIS — R54 Age-related physical debility: Secondary | ICD-10-CM | POA: Diagnosis present

## 2021-04-29 DIAGNOSIS — Z79899 Other long term (current) drug therapy: Secondary | ICD-10-CM

## 2021-04-29 DIAGNOSIS — R4182 Altered mental status, unspecified: Secondary | ICD-10-CM | POA: Diagnosis not present

## 2021-04-29 DIAGNOSIS — Z431 Encounter for attention to gastrostomy: Secondary | ICD-10-CM | POA: Diagnosis not present

## 2021-04-29 DIAGNOSIS — J9620 Acute and chronic respiratory failure, unspecified whether with hypoxia or hypercapnia: Secondary | ICD-10-CM | POA: Diagnosis not present

## 2021-04-29 DIAGNOSIS — Z430 Encounter for attention to tracheostomy: Secondary | ICD-10-CM | POA: Diagnosis not present

## 2021-04-29 LAB — CBC WITH DIFFERENTIAL/PLATELET
Abs Immature Granulocytes: 0.04 10*3/uL (ref 0.00–0.07)
Basophils Absolute: 0 10*3/uL (ref 0.0–0.1)
Basophils Relative: 0 %
Eosinophils Absolute: 0 10*3/uL (ref 0.0–0.5)
Eosinophils Relative: 0 %
HCT: 32.6 % — ABNORMAL LOW (ref 39.0–52.0)
Hemoglobin: 9.9 g/dL — ABNORMAL LOW (ref 13.0–17.0)
Immature Granulocytes: 0 %
Lymphocytes Relative: 4 %
Lymphs Abs: 0.4 10*3/uL — ABNORMAL LOW (ref 0.7–4.0)
MCH: 28 pg (ref 26.0–34.0)
MCHC: 30.4 g/dL (ref 30.0–36.0)
MCV: 92.1 fL (ref 80.0–100.0)
Monocytes Absolute: 0.7 10*3/uL (ref 0.1–1.0)
Monocytes Relative: 7 %
Neutro Abs: 9.9 10*3/uL — ABNORMAL HIGH (ref 1.7–7.7)
Neutrophils Relative %: 89 %
Platelets: 325 10*3/uL (ref 150–400)
RBC: 3.54 MIL/uL — ABNORMAL LOW (ref 4.22–5.81)
RDW: 16.4 % — ABNORMAL HIGH (ref 11.5–15.5)
WBC: 11.1 10*3/uL — ABNORMAL HIGH (ref 4.0–10.5)
nRBC: 0 % (ref 0.0–0.2)

## 2021-04-29 LAB — I-STAT VENOUS BLOOD GAS, ED
Acid-Base Excess: 6 mmol/L — ABNORMAL HIGH (ref 0.0–2.0)
Bicarbonate: 33 mmol/L — ABNORMAL HIGH (ref 20.0–28.0)
Calcium, Ion: 1.26 mmol/L (ref 1.15–1.40)
HCT: 48 % (ref 39.0–52.0)
Hemoglobin: 16.3 g/dL (ref 13.0–17.0)
O2 Saturation: 99 %
Potassium: 4.4 mmol/L (ref 3.5–5.1)
Sodium: 139 mmol/L (ref 135–145)
TCO2: 35 mmol/L — ABNORMAL HIGH (ref 22–32)
pCO2, Ven: 57.3 mmHg (ref 44–60)
pH, Ven: 7.369 (ref 7.25–7.43)
pO2, Ven: 169 mmHg — ABNORMAL HIGH (ref 32–45)

## 2021-04-29 LAB — BASIC METABOLIC PANEL
Anion gap: 8 (ref 5–15)
BUN: 24 mg/dL — ABNORMAL HIGH (ref 8–23)
CO2: 30 mmol/L (ref 22–32)
Calcium: 9.2 mg/dL (ref 8.9–10.3)
Chloride: 99 mmol/L (ref 98–111)
Creatinine, Ser: 0.53 mg/dL — ABNORMAL LOW (ref 0.61–1.24)
GFR, Estimated: >60 mL/min (ref >60)
Glucose, Bld: 173 mg/dL — ABNORMAL HIGH (ref 70–99)
Potassium: 4.4 mmol/L (ref 3.5–5.1)
Sodium: 137 mmol/L (ref 135–145)

## 2021-04-29 LAB — LACTIC ACID, PLASMA
Lactic Acid, Venous: 1.6 mmol/L (ref 0.5–1.9)
Lactic Acid, Venous: 1.7 mmol/L (ref 0.5–1.9)

## 2021-04-29 LAB — RESP PANEL BY RT-PCR (FLU A&B, COVID) ARPGX2
Influenza A by PCR: NEGATIVE
Influenza B by PCR: NEGATIVE
SARS Coronavirus 2 by RT PCR: NEGATIVE

## 2021-04-29 IMAGING — DX DG CHEST 1V PORT
1 series · 1 of 1 positions shown · non-contrast
Comparison: [DATE].

CLINICAL DATA: Altered mental status.

EXAM:
PORTABLE CHEST 1 VIEW

[chest ap]
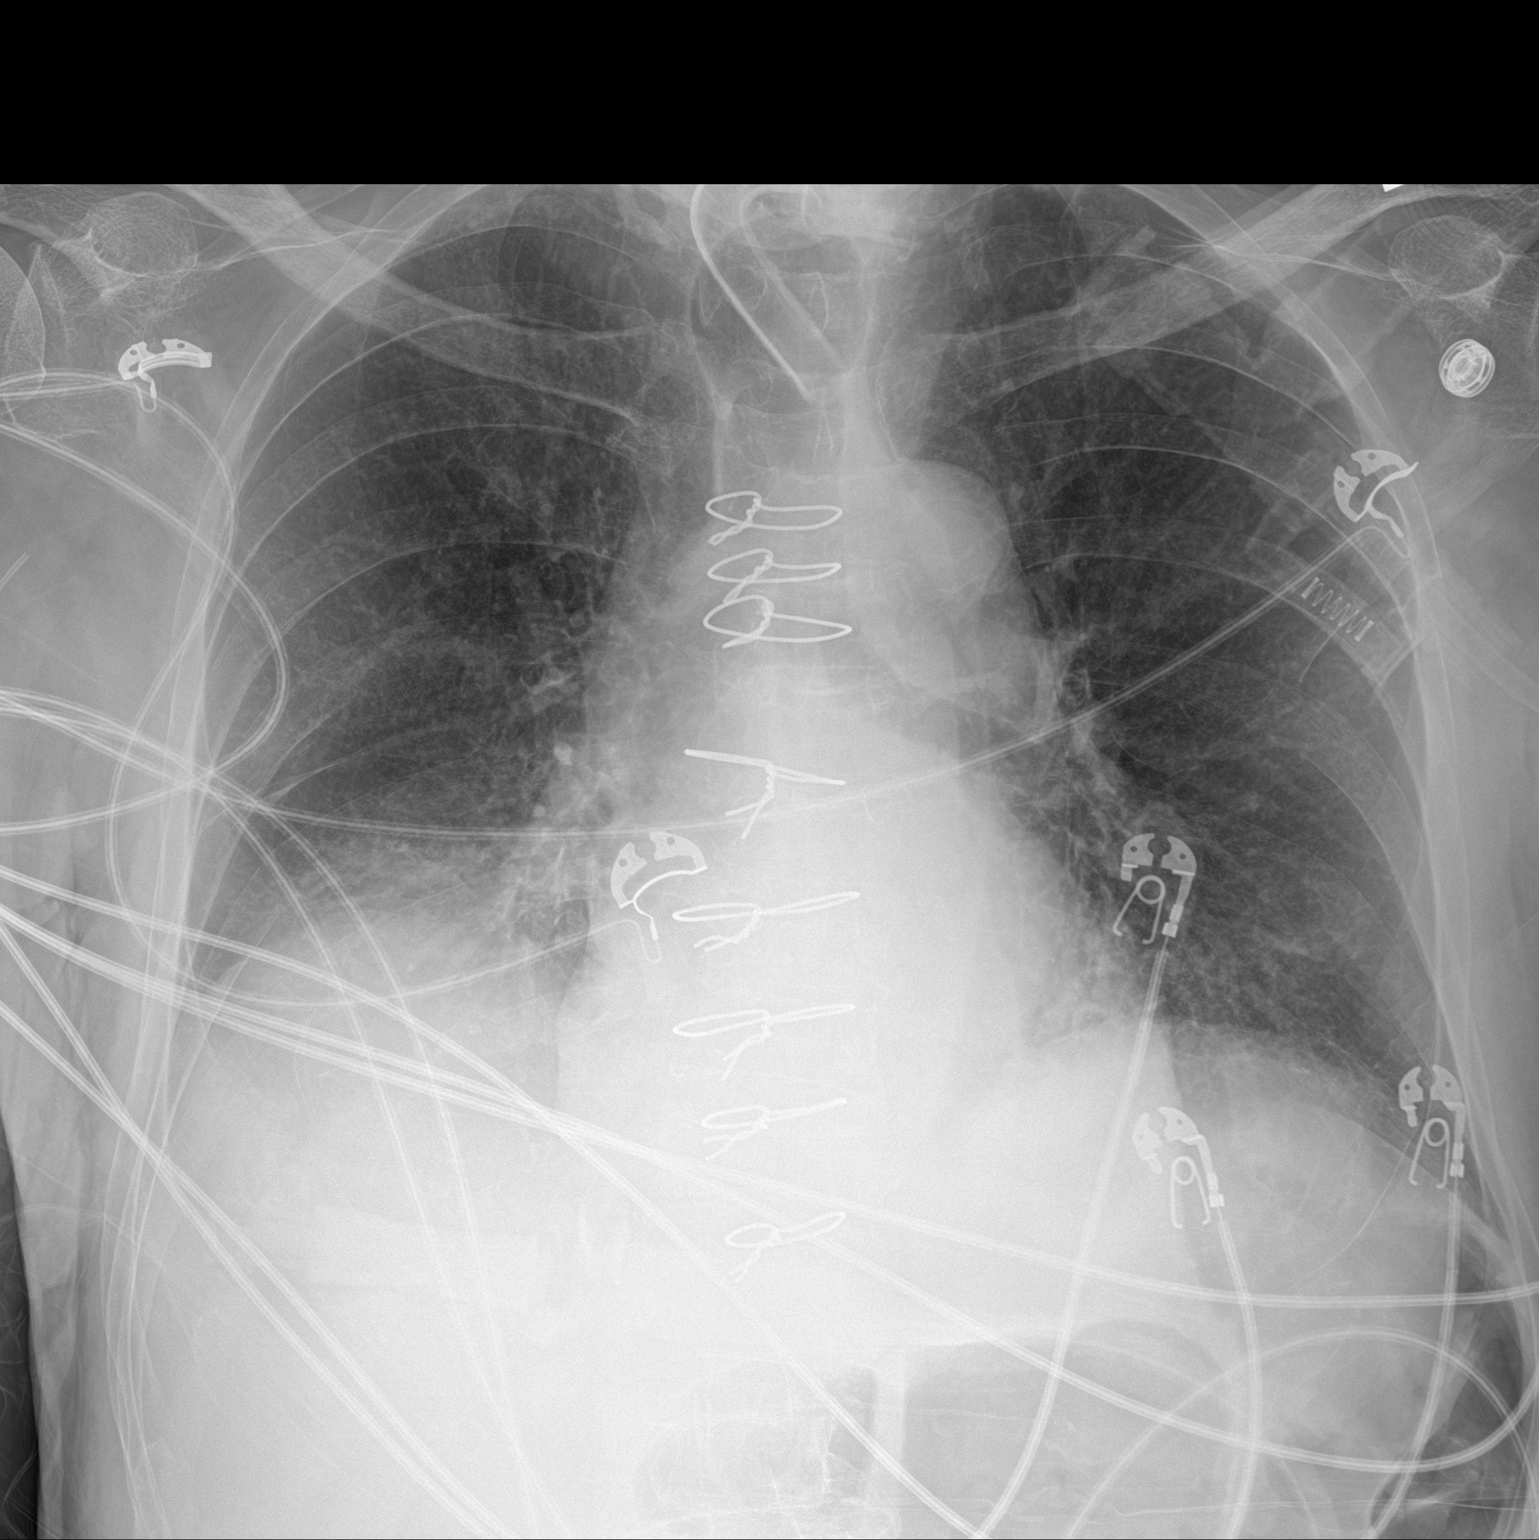

[1 of 1 positions shown; findings below may reference images not displayed]

FINDINGS: The heart size and mediastinal contours are within normal limits.
Tracheostomy is unchanged. Sternotomy wires are noted. Left lung is
clear. Minimal right basilar subsegmental atelectasis or infiltrate
is noted. The visualized skeletal structures are unremarkable.
IMPRESSION: Stable tracheostomy. Minimal right basilar subsegmental atelectasis
or infiltrate.

## 2021-04-29 MED ORDER — CEPHALEXIN 250 MG/5ML PO SUSR: 500.0000 mg | Freq: Three times a day (TID) | ORAL | 0 refills | Status: DC

## 2021-04-29 MED ORDER — SODIUM CHLORIDE 0.9 % IV SOLN
1.0000 g | Freq: Once | INTRAVENOUS | Status: AC
  Administered 2021-04-29: 1 g via INTRAVENOUS
  Filled 2021-04-29: qty 10

## 2021-04-29 MED ORDER — CLONAZEPAM 0.5 MG PO TABS
0.5000 mg | ORAL_TABLET | Freq: Two times a day (BID) | ORAL | Status: DC
  Administered 2021-04-30 – 2021-05-01 (×4): 0.5 mg
  Filled 2021-04-29 (×4): qty 1

## 2021-04-29 MED ORDER — VITAMIN D 25 MCG (1000 UNIT) PO TABS
2000.0000 [IU] | ORAL_TABLET | Freq: Every day | ORAL | Status: DC
  Administered 2021-04-30 – 2021-05-01 (×2): 2000 [IU]
  Filled 2021-04-29 (×2): qty 2

## 2021-04-29 MED ORDER — LACTULOSE 10 GM/15ML PO SOLN: 20.0000 g | Freq: Two times a day (BID) | ORAL | Status: DC | PRN

## 2021-04-29 MED ORDER — LEVETIRACETAM 100 MG/ML PO SOLN
500.0000 mg | Freq: Two times a day (BID) | ORAL | Status: DC
  Administered 2021-04-30 – 2021-05-01 (×4): 500 mg
  Filled 2021-04-29 (×5): qty 5

## 2021-04-29 MED ORDER — ESCITALOPRAM OXALATE 10 MG PO TABS
10.0000 mg | ORAL_TABLET | Freq: Every day | ORAL | Status: DC
  Administered 2021-04-30 – 2021-05-01 (×2): 10 mg
  Filled 2021-04-29 (×2): qty 1

## 2021-04-29 MED ORDER — OMEGA-3-ACID ETHYL ESTERS 1 G PO CAPS
1.0000 | ORAL_CAPSULE | Freq: Every day | ORAL | Status: DC
Start: 2021-04-30 — End: 2021-05-01
  Filled 2021-04-29 (×2): qty 1

## 2021-04-29 MED ORDER — PROSOURCE TF PO LIQD
45.0000 mL | Freq: Two times a day (BID) | ORAL | Status: DC
  Administered 2021-04-30 – 2021-05-01 (×3): 45 mL
  Filled 2021-04-29 (×3): qty 45

## 2021-04-29 MED ORDER — ENOXAPARIN SODIUM 30 MG/0.3ML IJ SOSY
30.0000 mg | PREFILLED_SYRINGE | INTRAMUSCULAR | Status: DC
  Administered 2021-04-30 – 2021-05-01 (×2): 30 mg via SUBCUTANEOUS
  Filled 2021-04-29 (×2): qty 0.3

## 2021-04-29 MED ORDER — ASCORBIC ACID 500 MG PO TABS
500.0000 mg | ORAL_TABLET | Freq: Every day | ORAL | Status: DC
  Administered 2021-04-30 – 2021-05-01 (×2): 500 mg
  Filled 2021-04-29 (×2): qty 1

## 2021-04-29 MED ORDER — DOCUSATE SODIUM 50 MG/5ML PO LIQD
100.0000 mg | Freq: Two times a day (BID) | ORAL | Status: DC | PRN
Start: 2021-04-29 — End: 2021-05-01

## 2021-04-29 MED ORDER — POLYETHYLENE GLYCOL 3350 17 G PO PACK: 17.0000 g | PACK | Freq: Every day | ORAL | Status: DC | PRN

## 2021-04-29 MED ORDER — IPRATROPIUM-ALBUTEROL 0.5-2.5 (3) MG/3ML IN SOLN: 3.0000 mL | Freq: Four times a day (QID) | RESPIRATORY_TRACT | Status: DC | PRN

## 2021-04-29 MED ORDER — ADULT MULTIVITAMIN W/MINERALS CH
1.0000 | ORAL_TABLET | Freq: Every day | ORAL | Status: DC
  Administered 2021-04-30 – 2021-05-01 (×2): 1
  Filled 2021-04-29 (×2): qty 1

## 2021-04-29 MED ORDER — LEVETIRACETAM IN NACL 1000 MG/100ML IV SOLN
1000.0000 mg | Freq: Once | INTRAVENOUS | Status: AC
Start: 2021-04-29 — End: 2021-04-29
  Administered 2021-04-29: 1000 mg via INTRAVENOUS
  Filled 2021-04-29: qty 100

## 2021-04-29 MED ORDER — FOLIC ACID 1 MG PO TABS
1.0000 mg | ORAL_TABLET | Freq: Every day | ORAL | Status: DC
  Administered 2021-04-30 – 2021-05-01 (×2): 1 mg
  Filled 2021-04-29 (×2): qty 1

## 2021-04-29 MED ORDER — CEPHALEXIN 250 MG/5ML PO SUSR
500.0000 mg | Freq: Three times a day (TID) | ORAL | Status: DC
  Administered 2021-04-30 – 2021-05-01 (×5): 500 mg
  Filled 2021-04-29 (×7): qty 10

## 2021-04-29 MED ORDER — LEVOTHYROXINE SODIUM 25 MCG PO TABS
25.0000 ug | ORAL_TABLET | Freq: Every day | ORAL | Status: DC
  Administered 2021-04-30 – 2021-05-01 (×2): 25 ug
  Filled 2021-04-29 (×2): qty 1

## 2021-04-29 MED ORDER — PANTOPRAZOLE 2 MG/ML SUSPENSION
40.0000 mg | Freq: Every day | ORAL | Status: DC
  Administered 2021-04-30 – 2021-05-01 (×2): 40 mg
  Filled 2021-04-29 (×2): qty 20

## 2021-04-29 MED ORDER — SODIUM CHLORIDE 0.9% FLUSH
3.0000 mL | Freq: Two times a day (BID) | INTRAVENOUS | Status: DC
  Administered 2021-04-30 – 2021-05-01 (×4): 3 mL via INTRAVENOUS

## 2021-04-29 MED ORDER — ACETAMINOPHEN ER 650 MG PO TBCR: 650.0000 mg | EXTENDED_RELEASE_TABLET | Freq: Three times a day (TID) | ORAL | Status: DC | PRN

## 2021-04-29 MED ORDER — LEVALBUTEROL HCL 1.25 MG/3ML IN NEBU: 1.2500 mg | INHALATION_SOLUTION | Freq: Four times a day (QID) | RESPIRATORY_TRACT | Status: DC

## 2021-04-29 MED ORDER — LACTATED RINGERS IV BOLUS
1000.0000 mL | Freq: Once | INTRAVENOUS | Status: AC
  Administered 2021-04-29: 1000 mL via INTRAVENOUS

## 2021-04-29 MED ORDER — SALINE SPRAY 0.65 % NA SOLN
1.0000 | NASAL | Status: DC | PRN
  Administered 2021-05-01: 1 via NASAL
  Filled 2021-04-29 (×2): qty 44

## 2021-04-29 MED ORDER — LORATADINE 10 MG PO TABS
10.0000 mg | ORAL_TABLET | Freq: Every day | ORAL | Status: DC
  Administered 2021-04-30: 10 mg
  Filled 2021-04-29 (×2): qty 1

## 2021-04-29 MED ORDER — MIDODRINE HCL 5 MG PO TABS
5.0000 mg | ORAL_TABLET | Freq: Three times a day (TID) | ORAL | Status: DC
  Administered 2021-04-30: 5 mg
  Filled 2021-04-29: qty 1

## 2021-04-29 MED ORDER — ONDANSETRON HCL 4 MG/2ML IJ SOLN: 4.0000 mg | Freq: Four times a day (QID) | INTRAMUSCULAR | Status: DC | PRN

## 2021-04-29 MED ORDER — LEVALBUTEROL HCL 1.25 MG/0.5ML IN NEBU
1.2500 mg | INHALATION_SOLUTION | Freq: Four times a day (QID) | RESPIRATORY_TRACT | Status: DC
  Administered 2021-04-30 – 2021-05-01 (×6): 1.25 mg via RESPIRATORY_TRACT
  Filled 2021-04-29 (×8): qty 0.5

## 2021-04-29 MED ORDER — SACCHAROMYCES BOULARDII 250 MG PO CAPS
250.0000 mg | ORAL_CAPSULE | Freq: Every day | ORAL | Status: DC
  Administered 2021-04-30 – 2021-05-01 (×2): 250 mg
  Filled 2021-04-29 (×2): qty 1

## 2021-04-29 MED ORDER — POLYETHYLENE GLYCOL 3350 17 G PO PACK
17.0000 g | PACK | ORAL | Status: DC
  Administered 2021-04-30: 17 g
  Filled 2021-04-29 (×2): qty 1

## 2021-04-29 MED ORDER — GUAIFENESIN 100 MG/5ML PO LIQD
20.0000 mL | Freq: Three times a day (TID) | ORAL | Status: DC
  Administered 2021-04-30 – 2021-05-01 (×4): 20 mL
  Filled 2021-04-29 (×4): qty 20

## 2021-04-29 MED ORDER — HYDROXYZINE HCL 25 MG PO TABS
25.0000 mg | ORAL_TABLET | Freq: Four times a day (QID) | ORAL | Status: DC | PRN
  Administered 2021-04-30 – 2021-05-01 (×3): 25 mg
  Filled 2021-04-29 (×3): qty 1

## 2021-04-29 MED ORDER — LEVETIRACETAM 100 MG/ML PO SOLN: 500.0000 mg | Freq: Two times a day (BID) | ORAL | 12 refills | Status: DC

## 2021-04-29 MED ORDER — ONDANSETRON HCL 4 MG PO TABS: 4.0000 mg | ORAL_TABLET | Freq: Four times a day (QID) | ORAL | Status: DC | PRN

## 2021-04-29 NOTE — Progress Notes (Signed)
RT transported patient on CT and back to ED without any complications. ?

## 2021-04-29 NOTE — Consult Note (Signed)
Neurology Consultation ?Reason for Consult: Episodes of concern ?Referring Physician: Carin Hock ? ?CC: Episodes of concern ? ?History is obtained from: Chart review, limited from patient due to tracheostomy/tremor ? ?HPI: Austin Davis is a 81 y.o. male with a history of tremor, paroxysmal atrial fibrillation not on anticoagulation who presents with several episodes of decreased responsiveness with report of gaze deviation.  He had two episodes earlier and was loaded with Keppra in the emergency department last night and started on 500 mg twice daily.  There was some delay in him getting the medication after he returned, but it was not a large delay given that he got 1500 mg of Keppra at 11:30 PM, and then 500 mg was given at 2 PM.  He had another episode of behavioral arrest and therefore was brought back to the emergency department. ? 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  I am unable to understand his mouthing words, and he does not want to attempt writing due to his tremor. ? ?ROS:  Unable to obtain due to difficulty with communication. ? ?Past Medical History:  ?Diagnosis Date  ? CHF (congestive heart failure) ([place])   ? Coronary artery disease   ? Essential tremor   ? GI bleed 2019  ? hospitalized at [facility] for one week  ? Headache   ? since childhood  ? Low blood sugar   ? since childhood, controlled by diet  ? Mitral regurgitation   ? Mitral valve prolapse   ? Osteopenia 2021  ? Paroxysmal atrial fibrillation (HCC)   ? ? ? ?Family History  ?Problem Relation Age of Onset  ? Heart disease Mother   ? CAD Mother   ? Migraines Mother   ? Heart failure Father   ? Skin cancer Father   ? Valvular heart disease Father   ?     prolapsed valve  ? Tremor Father   ?     "probably had a bit of tremor in his old age"  ? Heart failure Maternal Grandmother   ? Pneumonia Maternal Grandfather   ? Heart  failure Paternal Grandmother   ? Stroke Paternal Grandfather   ? Asthma Daughter   ? Epilepsy Son   ? Parkinson's disease Neg Hx   ? ? ? ?Social History:  reports that he has never smoked. He has never used smokeless tobacco. He reports current alcohol use. He reports that he does not use drugs. ? ? ?Exam: ?Current vital signs: ?BP 104/75   Pulse 76   Temp 98.1 ?F (36.7 ?C) (Oral)   Resp 19   Ht 5\' 7"  (1.702 m)   Wt 49.9 kg   SpO2 100%   BMI 17.23 kg/m?  ?Vital signs in last 24 hours: ?Temp:  [98.1 ?F (36.7 ?C)-98.6 ?F (37 ?C)] 98.1 ?F (36.7 ?C) (04/10 1646) ?Pulse Rate:  [63-102] 76 (04/10 2100) ?Resp:  [14-22] 19 (04/10 2100) ?BP: (78-131)/(55-82) 104/75 (04/10 2100) ?SpO2:  [94 %-100 %] 100 % (04/10 2100) ?FiO2 (%):  [35 %-75 %] 35 % (04/10 2012) ?Weight:  [49.9 kg] 49.9 kg (04/10 1644) ? ? ?Physical Exam  ?Constitutional: Appears well-developed and well-nourished.  ?Psych: Affect appropriate to situation ?Eyes: No scleral injection ?HENT: No OP obstruction ?MSK: no joint deformities.  ?Cardiovascular: Normal rate and regular rhythm.  ?Respiratory: Effort normal, non-labored breathing ?GI: Soft.  No distension. There is no tenderness.  ?Skin: WDI ? ?  Neuro: ?Mental Status: ?Patient is awake, alert, he follows commands readily ?Cranial Nerves: ?II: Visual Fields are full. Pupils are equal, round, and reactive to light.   ?III,IV, VI: EOMI without ptosis or diploplia.  ?V: Facial sensation is symmetric to temperature ?VII: Facial movement is symmetric.  He has significant facial tremor ?VIII: hearing is intact to voice ?X: Uvula elevates symmetrically ?XI: Shoulder shrug is symmetric. ?XII: tongue is midline without atrophy or fasciculations.  ?Motor: ?Bulk is normal. 5/5 strength was present in all four extremities.  He has significant intentional tremor bilaterally ?Sensory: ?Sensation is symmetric to light touch in the arms and legs. ?Cerebellar: ?He has significant tremor on finger-nose-finger  bilaterally ? ? ?I have reviewed labs in epic and the results pertinent to this consultation are: ?Creatinine 0.53 ?Sodium 137 ?Calcium 9.2 ?Ammonia 24 ?Lactic acid of 2.5 yesterday, normalized today ? ?I have reviewed the images obtained: CT head-some atrophy but no acute findings ? ?Impression: 81 year old male with three episodes of gaze deviation and decreased responsiveness (?  Behavioral arrest,?  Aphasia,?  Unresponsiveness).  At this point given the frequency of the events, lack of response to antiepileptic medications, and unclear etiology I think that further characterization would be prudent.  I think that partial seizure is most likely, but if he does have significant stenosis of one of his intracranial vessels, hypoperfusion could give recurrent episodes like this. ? ?Recommendations: ?1) MRI brain, MRA head and neck ?2) LTM EEG after MRI ? ? ?Roland Rack, MD ?[facility] ?[phone] ? ?If 7pm- 7am, please page neurology on call as listed in [facility]. ? ?

## 2021-04-29 NOTE — ED Provider Notes (Signed)
?[facility] ?Provider Note ? ? ?CSN: [id number] ?Arrival date & time: 04/29/21  1622 ? ?  ? ?History ? ?Chief Complaint  ?Patient presents with  ? [name]  ? Seizures  ? ? ?Austin Davis CODE is a 81 y.o. male.  He is brought in by [name] facility for evaluation of a possible seizure.  Was here yesterday seen by me loaded with Keppra.  Reportedly left here around 4 AM and did not receive his Keppra dose until 2 PM today.  Per transport team patient had another episode of staring spell.  Currently drowsy but awake denies complaints.  On ventilator. ? ?The history is provided by the EMS personnel.  ?Seizures ?Seizure activity on arrival: no   ?Episode characteristics: eye deviation   ?Postictal symptoms: somnolence   ?Return to baseline: no   ?Progression:  Resolved ?Recent head injury:  No recent head injuries ?PTA treatment:  None ?History of seizures: no   ? ?  ? ?Home Medications ?Prior to Admission medications   ?Medication Sig Start Date End Date Taking? Authorizing Provider  ?acetaminophen (TYLENOL) 650 MG CR tablet Take 650 mg by mouth every 8 (eight) hours as needed for pain. Via tube    [provider]  ?ALPRAZolam Duanne Moron) 0.5 MG tablet Place 1 tablet (0.5 mg total) into feeding tube every 8 (eight) hours as needed for anxiety. 04/16/21   [name], DO  ?Amino Acids-Protein Hydrolys (FEEDING SUPPLEMENT, PRO-STAT SUGAR FREE 64,) LIQD Place 30 mLs into feeding tube 2 (two) times daily.    [provider]  ?cephALEXin (KEFLEX) 250 MG/5ML suspension Take 10 mLs (500 mg total) by mouth 3 (three) times daily for 10 days. 04/29/21 05/09/21  [name], MD  ?Cholecalciferol (VITAMIN D3 SUPER STRENGTH) 50 MCG (2000 UT) TABS Give 2,000 Units by tube daily.    [provider]  ?clonazePAM (KLONOPIN) 0.5 MG tablet Place 1 tablet (0.5 mg total) into feeding tube 2 (two) times daily. 04/16/21   [name], DO   ?Docosahexaenoic Acid (DHA OMEGA 3 PO) Place 1 capsule into feeding tube daily.    [provider]  ?doxazosin (CARDURA) 2 MG tablet Place 1 tablet (2 mg total) into feeding tube daily. 04/17/21   [name], DO  ?escitalopram (LEXAPRO) 10 MG tablet Place 10 mg into feeding tube daily.    [provider]  ?esomeprazole (NEXIUM) 40 MG packet Take 40 mg by mouth daily. Per G tube    [provider]  ?fexofenadine (ALLEGRA) 180 MG tablet Place 180 mg into feeding tube daily.    [provider]  ?folic acid (FOLVITE) 1 MG tablet Take 1 tablet (1 mg total) by mouth daily. ?Patient taking differently: Place 1 mg into feeding tube daily. 09/29/20   [name], MD  ?furosemide (LASIX) 20 MG tablet Place 1 tablet (20 mg total) into feeding tube daily as needed for fluid or edema. 04/16/21   [name], DO  ?guaiFENesin (ROBITUSSIN) 100 MG/5ML liquid Place 20 mLs into feeding tube 3 (three) times daily.    [provider]  ?hydrOXYzine (ATARAX) 25 MG tablet Place 25 mg into feeding tube every 6 (six) hours as needed for anxiety.    [provider]  ?ipratropium-albuterol (DUONEB) 0.5-2.5 (3) MG/3ML SOLN Take 3 mLs by nebulization every 6 (six) hours as needed (shortness of breath).    [provider]  ?Lactobacillus Rhamnosus, GG, (CULTURELLE) CAPS Give 10  Billion Cells by tube daily.    [provider]  ?lactulose (CHRONULAC) 10 GM/15ML solution Place 20 g into feeding tube 2 (two) times daily as needed for mild constipation.    [provider]  ?levalbuterol (XOPENEX) 1.25 MG/3ML nebulizer solution Take 1.25 mg by nebulization every 6 (six) hours. For 30 days starting on 03-23-21    [provider]  ?levETIRAcetam (KEPPRA) 100 MG/ML solution Place 5 mLs (500 mg total) into feeding tube 2 (two) times daily. 04/29/21   [name], MD  ?levothyroxine (SYNTHROID) 25 MCG tablet Place 25 mcg into feeding tube  daily before breakfast.    [provider]  ?midodrine (PROAMATINE) 5 MG tablet Take 1 tablet (5 mg total) by mouth 3 (three) times daily with meals. ?Patient taking differently: Place 5 mg into feeding tube 3 (three) times daily. 09/28/20   [name], MD  ?Multiple Vitamin (MULTIVITAMIN WITH MINERALS) TABS tablet Take 1 tablet by mouth daily. ?Patient taking differently: Place 1 tablet into feeding tube daily. 09/29/20   [name], MD  ?Nutritional Supplements (JEVITY 1.5 CAL PO) Give 50 mLs by tube in the morning and at bedtime.    [provider]  ?polyethylene glycol powder (GLYCOLAX/MIRALAX) 17 GM/SCOOP powder Place 17 g into feeding tube every other day.    [provider]  ?sodium chloride (OCEAN) 0.65 % SOLN nasal spray Place 1 spray into both nostrils every 4 (four) hours as needed for congestion.    [provider]  ?vitamin C (ASCORBIC ACID) 500 MG tablet Place 500 mg into feeding tube daily.    [provider]  ?zinc gluconate 50 MG tablet Place 50 mg into feeding tube 2 (two) times daily.    [provider]  ?   ? ?Allergies    ?Prednisone and Cortisone   ? ?Review of Systems   ?Review of Systems  ?Constitutional:  Negative for fever.  ?Respiratory:  Negative for shortness of breath.   ?Cardiovascular:  Negative for chest pain.  ?Gastrointestinal:  Negative for abdominal pain.  ?Neurological:  Positive for seizures.  ? ?Physical Exam ?Updated Vital Signs ?BP (!) 78/57   Pulse 81   Temp 98.1 ?F (36.7 ?C) (Oral)   Resp 18   Ht 5\' 7"  (1.702 m)   Wt 49.9 kg   SpO2 100%   BMI 17.23 kg/m?  ?Physical Exam ?Vitals and nursing note reviewed.  ?Constitutional:   ?   General: He is not in acute distress. ?   Appearance: Normal appearance. He is well-developed.  ?HENT:  ?   Head: Normocephalic and atraumatic.  ?Eyes:  ?   Conjunctiva/sclera: Conjunctivae normal.  ?Neck:  ?   Comments: Trach dependent ?Cardiovascular:  ?   Rate and  Rhythm: Normal rate and regular rhythm.  ?   Heart sounds: No murmur heard. ?Pulmonary:  ?   Effort: Pulmonary effort is normal. No respiratory distress.  ?   Breath sounds: Normal breath sounds.  ?Abdominal:  ?   Palpations: Abdomen is soft.  ?   Tenderness: There is no abdominal tenderness. There is no guarding or rebound.  ?   Comments: PEG tube  ?Musculoskeletal:     ?   General: No swelling.  ?   Cervical back: Neck supple.  ?Skin: ?   General: Skin is warm and dry.  ?   Capillary Refill: Capillary refill takes less than 2 seconds.  ?Neurological:  ?   General: No focal deficit  present.  ?   Comments: He is arousable to voice.  He has some upper extremity and facial tremor.  He is following commands.  ? ? ?ED Results / Procedures / Treatments   ?Labs ?(all labs ordered are listed, but only abnormal results are displayed) ?Labs Reviewed  ?CBC WITH DIFFERENTIAL/PLATELET - Abnormal; Notable for the following components:  ?    Result Value  ? WBC 11.1 (*)   ? RBC 3.54 (*)   ? Hemoglobin 9.9 (*)   ? HCT 32.6 (*)   ? RDW 16.4 (*)   ? Neutro Abs 9.9 (*)   ? Lymphs Abs 0.4 (*)   ? All other components within normal limits  ?BASIC METABOLIC PANEL - Abnormal; Notable for the following components:  ? Glucose, Bld 173 (*)   ? BUN 24 (*)   ? Creatinine, Ser 0.53 (*)   ? All other components within normal limits  ?CREATININE, SERUM - Abnormal; Notable for the following components:  ? Creatinine, Ser 0.50 (*)   ? All other components within normal limits  ?COMPREHENSIVE METABOLIC PANEL - Abnormal; Notable for the following components:  ? Creatinine, Ser 0.47 (*)   ? Total Protein 5.9 (*)   ? Albumin 2.7 (*)   ? All other components within normal limits  ?CBC - Abnormal; Notable for the following components:  ? RBC 3.36 (*)   ? Hemoglobin 9.5 (*)   ? HCT 30.1 (*)   ? RDW 16.5 (*)   ? All other components within normal limits  ?BRAIN NATRIURETIC PEPTIDE - Abnormal; Notable for the following components:  ? B Natriuretic Peptide  134.2 (*)   ? All other components within normal limits  ?GLUCOSE, CAPILLARY - Abnormal; Notable for the following components:  ? Glucose-Capillary 103 (*)   ? All other components within normal limits  ?GLUCOSE, CAPILLARY -

## 2021-04-29 NOTE — ED Triage Notes (Signed)
Pt arrived from [facility] by [facility]. Pt was seen in ED this morning and discharged around 4am. He was seen for seizures and is coming back again for more seizure like activity.  Staff at [facility] state that he did not get his Keppra dose till about 2 pm this after.  ? ?Pt is vent depended, RT at bedside on arrival  ?[facility] deny witnessing any seizure activity during transport.  ? ? ?Pt is back to baseline on arrival. Able to communicate with nodding and boards brought from facility   ?

## 2021-04-29 NOTE — ED Provider Notes (Signed)
I assumed care of this patient.  Please see previous provider note for further details of Hx, PE.  Briefly patient is a 81 y.o. male who presented for seizure-like activity.  CT head unremarkable.  Evaluated by neurology who recommended Keppra load.  Currently awaiting UA. ? ?UA concerning for urinary tract infection.  Given IV Rocephin.  Prescribed Keflex. ? ?The patient appears reasonably screened and/or stabilized for discharge and I doubt any other medical condition or other [facility] requiring further screening, evaluation, or treatment in the ED at this time prior to discharge. Safe for discharge with strict return precautions. ? ?Disposition: Discharge ? ?Condition: Good ? ?I have discussed the results, Dx and Tx plan with the patient/family who expressed understanding and agree(s) with the plan. Discharge instructions discussed at length. The patient/family was given strict return precautions who verbalized understanding of the instructions. No further questions at time of discharge.  ? ? ?ED Discharge Orders   ? ?      Ordered  ?  levETIRAcetam (KEPPRA) 100 MG/ML solution  2 times daily       ? 04/29/21 0321  ?  cephALEXin (KEFLEX) 250 MG/5ML suspension  3 times daily       ? 04/29/21 0321  ? ?  ?  ? ?  ? ? ? ?Follow Up: ?Melvenia Beam, MD ?[facility] ?[address] ?[place] [address] ?[phone] ? ?Schedule an appointment as soon as possible for a visit  ?For recheck of your symptoms ? ? ? ? ? ? ?  ?[name], MD ?04/29/21 [phone] ? ?

## 2021-04-29 NOTE — H&P (Signed)
?History and Physical  ? ? ?Austin Davis FMB:[id number] DOB: 1940/09/15 DOA: 04/29/2021 ? ?PCP: Aaron Edelman, MD  ?[facility] ? ?I have personally briefly reviewed patient's old medical records in [place] ? ?Chief Complaint: sz activity ? ?HPI: Austin Davis is a 81 y.o. 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 [facility] am of 4/10 due to staring spells. Discussed with neurology who rec starting keppra and due to patient return back to baseline d/c back to [facility].  Patient also at that time noted to have uti tx ctx x one and started on Keflex. Patient was then discharged back to [facility].  Patient now returns with further sz like activity of note per chart patient had gotten his Keppra dose late.  Patient is now admitted for further expedited sz evaluation and monitoring. Patient in ed back to baseline, able to follow commands. On ros denies pain, n/v/diarrhea/chest discomfort,but states feels congested. Patient  briefly desat 85% suctioned with recovery to 100% on chronic vent settings.  ? ? ?ED Course:  ?Patient was hypotensive initially but BP recovered s/p LR ,  ?Patient was discussed with neurology who rec admit with MRI/EEG.  ?Patient discussed with Critical care who rec step down admit with critical care  consult  ? ?Afeb:  bp:78/57 tx with2l (114/62) sat 1005 on 75% fio2 on chronic vent settings  rr20 ?LABS ?-wbc:11.1 pmn 89, cr 0.53, fs173 ?Lactic 1.6 ?Vbg:7.36/57.3 ?Respiratory panel:neg ?TSV:XBLTJ no st -twave changes ?QZE:SPQZRAQTMA: ?Stable tracheostomy. Minimal right basilar subsegmental atelectasis ?or infiltrate. ? ?Tx: keppra ?Review of Systems: As per HPI otherwise , partial ros due to difficult  communication  ?Past Medical History:  ?Diagnosis Date  ? CHF (congestive heart failure) ([place])   ? Coronary artery disease   ? Essential tremor   ? GI bleed 2019  ? hospitalized at [facility] for one week  ? Headache   ? since childhood  ? Low blood sugar   ? since childhood, controlled by diet  ? Mitral regurgitation   ? Mitral valve prolapse   ? Osteopenia 2021  ? Paroxysmal atrial fibrillation (HCC)   ? ? ?Past Surgical History:  ?Procedure Laterality Date  ? COLONOSCOPY WITH PROPOFOL N/A 02/19/2017  ? Procedure: COLONOSCOPY WITH PROPOFOL;  Surgeon: Wilford Corner, MD;  Location: [facility];  Service: Endoscopy;  Laterality: N/A;  ? IR GASTROSTOMY TUBE MOD SED  10/18/2020  ? IR IVC FILTER PLMT / S&I /IMG GUID/MOD SED  11/01/2020  ? laser eye surgery for retina detachment    ? MITRAL VALVE REPAIR  01/2003  ? monitor  02/05/2006  ? polyp removal    ? TONSILLECTOMY    ? tooth removal    ? as a teenager  ? TRACHEOSTOMY TUBE PLACEMENT N/A 10/26/2020  ? Procedure: TRACHEOSTOMY;  Surgeon: Rozetta Nunnery, MD;  Location: [facility];  Service: ENT;  Laterality: N/A;  ? ? ? reports that he has never smoked. He has never used smokeless tobacco. He reports current alcohol use. He reports that he does not use drugs. ? ?Allergies  ?Allergen Reactions  ? Prednisone Other (See Comments)  ?  Makes skin crawl, rapid HR  ? Cortisone Palpitations  ? ? ?Family History  ?Problem Relation Age of Onset  ?  Heart disease Mother   ? CAD Mother   ? Migraines Mother   ? Heart failure Father   ? Skin cancer Father   ? Valvular heart disease Father   ?     prolapsed valve  ? Tremor Father   ?     "probably had a bit of tremor in his old age"  ? Heart failure Maternal Grandmother   ? Pneumonia Maternal Grandfather   ? Heart failure Paternal Grandmother   ? Stroke Paternal Grandfather   ? Asthma Daughter   ? Epilepsy Son   ? Parkinson's disease Neg Hx   ? ? ?Prior to Admission medications   ?Medication Sig Start Date End Date Taking?  Authorizing Provider  ?acetaminophen (TYLENOL) 650 MG CR tablet Take 650 mg by mouth every 8 (eight) hours as needed for pain. Via tube    [provider]  ?ALPRAZolam Duanne Moron) 0.5 MG tablet Place 1 tablet (0.5 mg total) into feeding tube every 8 (eight) hours as needed for anxiety. 04/16/21   [name], DO  ?Amino Acids-Protein Hydrolys (FEEDING SUPPLEMENT, PRO-STAT SUGAR FREE 64,) LIQD Place 30 mLs into feeding tube 2 (two) times daily.    [provider]  ?cephALEXin (KEFLEX) 250 MG/5ML suspension Take 10 mLs (500 mg total) by mouth 3 (three) times daily for 10 days. 04/29/21 05/09/21  [name], MD  ?Cholecalciferol (VITAMIN D3 SUPER STRENGTH) 50 MCG (2000 UT) TABS Give 2,000 Units by tube daily.    [provider]  ?clonazePAM (KLONOPIN) 0.5 MG tablet Place 1 tablet (0.5 mg total) into feeding tube 2 (two) times daily. 04/16/21   [name], DO  ?Docosahexaenoic Acid (DHA OMEGA 3 PO) Place 1 capsule into feeding tube daily.    [provider]  ?doxazosin (CARDURA) 2 MG tablet Place 1 tablet (2 mg total) into feeding tube daily. 04/17/21   [name], DO  ?escitalopram (LEXAPRO) 10 MG tablet Place 10 mg into feeding tube daily.    [provider]  ?esomeprazole (NEXIUM) 40 MG packet Take 40 mg by mouth daily. Per G tube    [provider]  ?fexofenadine (ALLEGRA) 180 MG tablet Place 180 mg into feeding tube daily.    [provider]  ?folic acid (FOLVITE) 1 MG tablet Take 1 tablet (1 mg total) by mouth daily. ?Patient taking differently: Place 1 mg into feeding tube daily. 09/29/20   [name], MD  ?furosemide (LASIX) 20 MG tablet Place 1 tablet (20 mg total) into feeding tube daily as needed for fluid or edema. 04/16/21   [name], DO  ?guaiFENesin (ROBITUSSIN) 100 MG/5ML liquid Place 20 mLs into feeding tube 3 (three) times daily.    [provider]  ?hydrOXYzine (ATARAX) 25 MG tablet Place 25 mg into  feeding tube every 6 (six) hours as needed for anxiety.    [provider]  ?ipratropium-albuterol (DUONEB) 0.5-2.5 (3) MG/3ML SOLN Take 3 mLs by nebulization every 6 (six) hours as needed (shortness of breath).    [provider]  ?Lactobacillus Rhamnosus, GG, (CULTURELLE) CAPS Give 10 Billion Cells by tube daily.    [provider]  ?lactulose (CHRONULAC) 10 GM/15ML solution Place 20 g into feeding tube 2 (two) times daily as needed for mild constipation.    [provider]  ?levalbuterol (XOPENEX) 1.25 MG/3ML nebulizer solution Take 1.25 mg by nebulization every 6 (six) hours. For 30 days starting on 03-23-21    [provider]  ?  levETIRAcetam (KEPPRA) 100 MG/ML solution Place 5 mLs (500 mg total) into feeding tube 2 (two) times daily. 04/29/21   [name], MD  ?levothyroxine (SYNTHROID) 25 MCG tablet Place 25 mcg into feeding tube daily before breakfast.    [provider]  ?midodrine (PROAMATINE) 5 MG tablet Take 1 tablet (5 mg total) by mouth 3 (three) times daily with meals. ?Patient taking differently: Place 5 mg into feeding tube 3 (three) times daily. 09/28/20   [name], MD  ?Multiple Vitamin (MULTIVITAMIN WITH MINERALS) TABS tablet Take 1 tablet by mouth daily. ?Patient taking differently: Place 1 tablet into feeding tube daily. 09/29/20   [name], MD  ?Nutritional Supplements (JEVITY 1.5 CAL PO) Give 50 mLs by tube in the morning and at bedtime.    [provider]  ?polyethylene glycol powder (GLYCOLAX/MIRALAX) 17 GM/SCOOP powder Place 17 g into feeding tube every other day.    [provider]  ?sodium chloride (OCEAN) 0.65 % SOLN nasal spray Place 1 spray into both nostrils every 4 (four) hours as needed for congestion.    [provider]  ?vitamin C (ASCORBIC ACID) 500 MG tablet Place 500 mg into feeding tube daily.    [provider]  ?zinc gluconate 50 MG tablet Place 50 mg  into feeding tube 2 (two) times daily.    [provider]  ? ? ?Physical Exam: ?Vitals:  ? 04/29/21 2000 04/29/21 2012 04/29/21 2030 04/29/21 2100  ?BP: 126/82  131/79 104/75  ?Pulse: 63  64 76  ?Resp: 14

## 2021-04-29 NOTE — ED Notes (Signed)
Pt c/o headache and requesting medication for pain relief. Provider made aware & lights turned off in room per pt request.  ?

## 2021-04-29 NOTE — ED Notes (Signed)
Called [facility] to transport patient to [facility]. Patient has been added to the list.  ?

## 2021-04-29 NOTE — ED Notes (Signed)
[facility] at pt bedside, report given to [facility] in addition to [employer] (previously documented).  ?

## 2021-04-30 ENCOUNTER — Inpatient Hospital Stay (HOSPITAL_COMMUNITY): Payer: Medicare Other

## 2021-04-30 DIAGNOSIS — R569 Unspecified convulsions: Secondary | ICD-10-CM | POA: Diagnosis not present

## 2021-04-30 DIAGNOSIS — Z9911 Dependence on respirator [ventilator] status: Secondary | ICD-10-CM | POA: Diagnosis not present

## 2021-04-30 DIAGNOSIS — L8990 Pressure ulcer of unspecified site, unspecified stage: Secondary | ICD-10-CM | POA: Insufficient documentation

## 2021-04-30 LAB — MAGNESIUM
Magnesium: 2 mg/dL (ref 1.7–2.4)
Magnesium: 2 mg/dL (ref 1.7–2.4)

## 2021-04-30 LAB — GLUCOSE, CAPILLARY
Glucose-Capillary: 100 mg/dL — ABNORMAL HIGH (ref 70–99)
Glucose-Capillary: 103 mg/dL — ABNORMAL HIGH (ref 70–99)
Glucose-Capillary: 108 mg/dL — ABNORMAL HIGH (ref 70–99)
Glucose-Capillary: 129 mg/dL — ABNORMAL HIGH (ref 70–99)
Glucose-Capillary: 76 mg/dL (ref 70–99)
Glucose-Capillary: 95 mg/dL (ref 70–99)
Glucose-Capillary: 96 mg/dL (ref 70–99)
Glucose-Capillary: 99 mg/dL (ref 70–99)

## 2021-04-30 LAB — COMPREHENSIVE METABOLIC PANEL
ALT: 25 U/L (ref 0–44)
AST: 21 U/L (ref 15–41)
Albumin: 2.7 g/dL — ABNORMAL LOW (ref 3.5–5.0)
Alkaline Phosphatase: 103 U/L (ref 38–126)
Anion gap: 8 (ref 5–15)
BUN: 22 mg/dL (ref 8–23)
CO2: 25 mmol/L (ref 22–32)
Calcium: 9.1 mg/dL (ref 8.9–10.3)
Chloride: 104 mmol/L (ref 98–111)
Creatinine, Ser: 0.47 mg/dL — ABNORMAL LOW (ref 0.61–1.24)
GFR, Estimated: >60 mL/min (ref >60)
Glucose, Bld: 80 mg/dL (ref 70–99)
Potassium: 3.7 mmol/L (ref 3.5–5.1)
Sodium: 137 mmol/L (ref 135–145)
Total Bilirubin: 0.5 mg/dL (ref 0.3–1.2)
Total Protein: 5.9 g/dL — ABNORMAL LOW (ref 6.5–8.1)

## 2021-04-30 LAB — CORTISOL: Cortisol, Plasma: 4.1 ug/dL

## 2021-04-30 LAB — CBC
HCT: 30.1 % — ABNORMAL LOW (ref 39.0–52.0)
Hemoglobin: 9.5 g/dL — ABNORMAL LOW (ref 13.0–17.0)
MCH: 28.3 pg (ref 26.0–34.0)
MCHC: 31.6 g/dL (ref 30.0–36.0)
MCV: 89.6 fL (ref 80.0–100.0)
Platelets: 228 10*3/uL (ref 150–400)
RBC: 3.36 MIL/uL — ABNORMAL LOW (ref 4.22–5.81)
RDW: 16.5 % — ABNORMAL HIGH (ref 11.5–15.5)
WBC: 8.3 10*3/uL (ref 4.0–10.5)
nRBC: 0 % (ref 0.0–0.2)

## 2021-04-30 LAB — PROCALCITONIN: Procalcitonin: 0.31 ng/mL

## 2021-04-30 LAB — CREATININE, SERUM
Creatinine, Ser: 0.5 mg/dL — ABNORMAL LOW (ref 0.61–1.24)
GFR, Estimated: >60 mL/min (ref >60)

## 2021-04-30 LAB — TSH: TSH: 1.244 u[IU]/mL (ref 0.350–4.500)

## 2021-04-30 LAB — PHOSPHORUS: Phosphorus: 4 mg/dL (ref 2.5–4.6)

## 2021-04-30 LAB — BRAIN NATRIURETIC PEPTIDE: B Natriuretic Peptide: 134.2 pg/mL — ABNORMAL HIGH (ref 0.0–100.0)

## 2021-04-30 IMAGING — MR MR MRA NECK W/O CM
1 series · 19 of 48 positions shown · non-contrast
Comparison: None.

CLINICAL DATA: Seizure, new-onset, no history of trauma; Seizure
disorder, clinical change; Stroke, follow up



[Series 6: ax (id) · axial · 2.8mm · 0.47mm/px · z∈[-283,-100]mm · 19 of 140 slices shown]
[im 1/140]
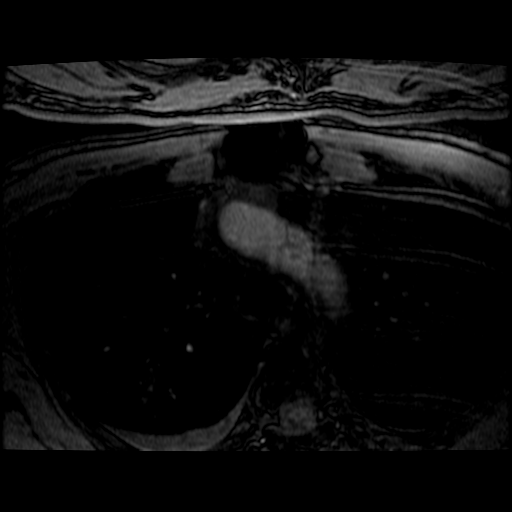
[im 3/140]
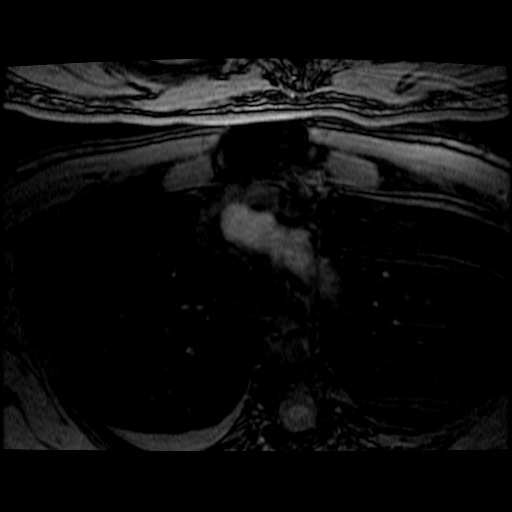
[im 6/140]
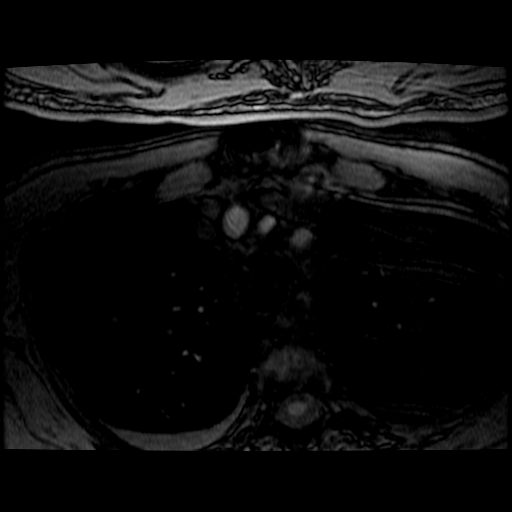
[im 9/140]
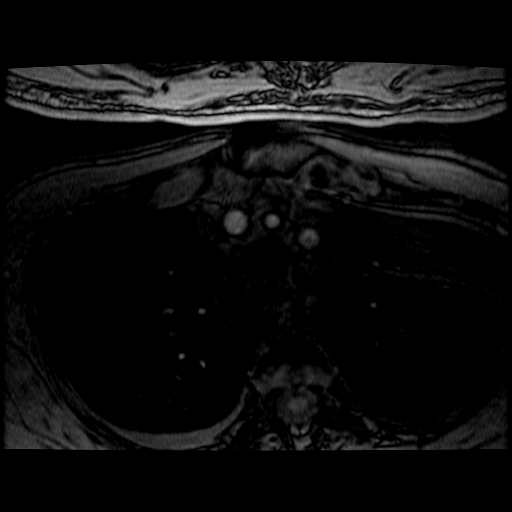
[im 12/140]
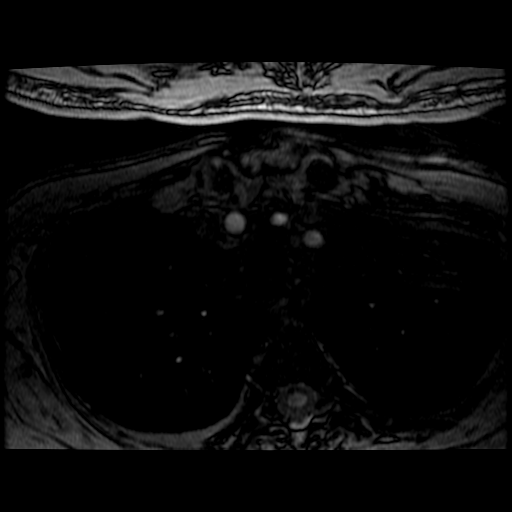
[im 15/140]
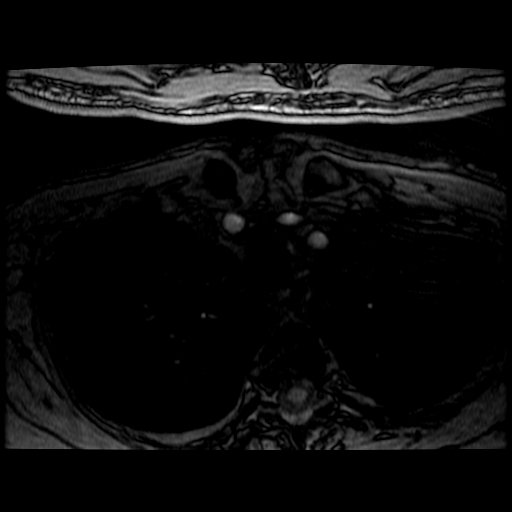
[im 18/140]
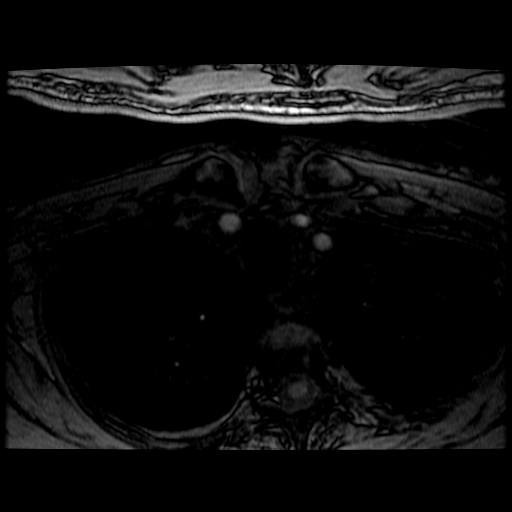
[im 21/140]
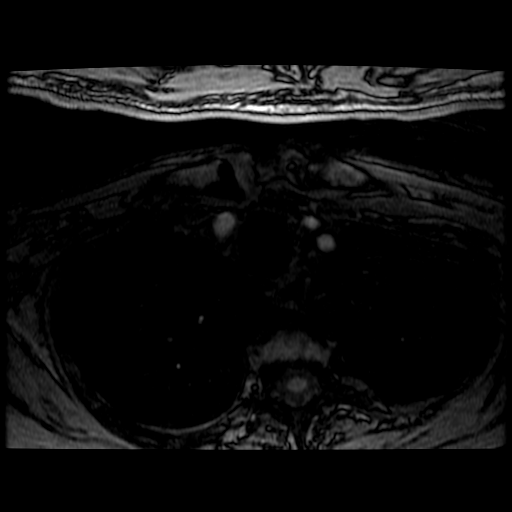
[im 24/140]
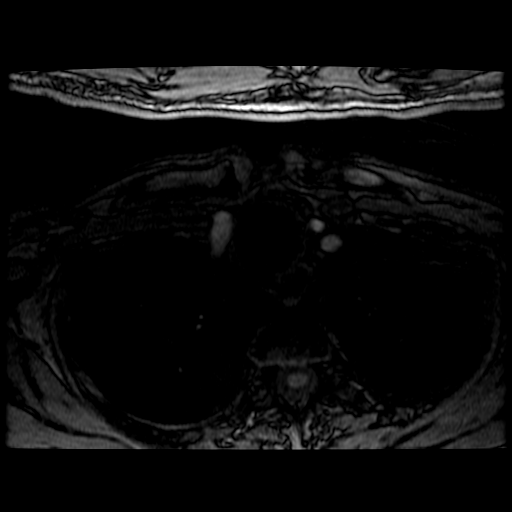
[im 27/140]
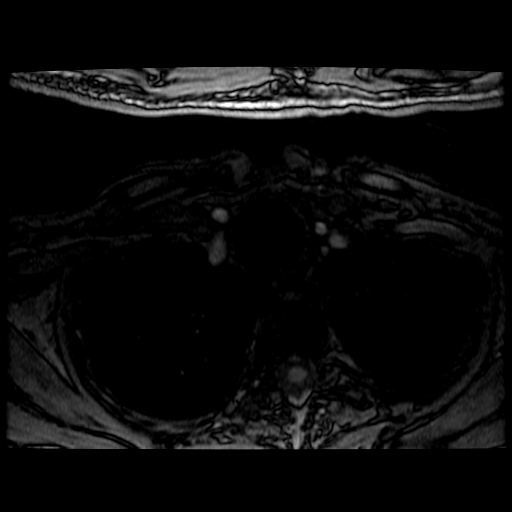
[im 30/140]
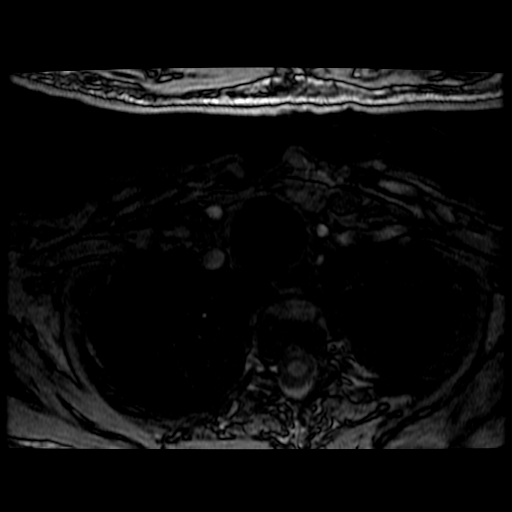
[im 45/140]
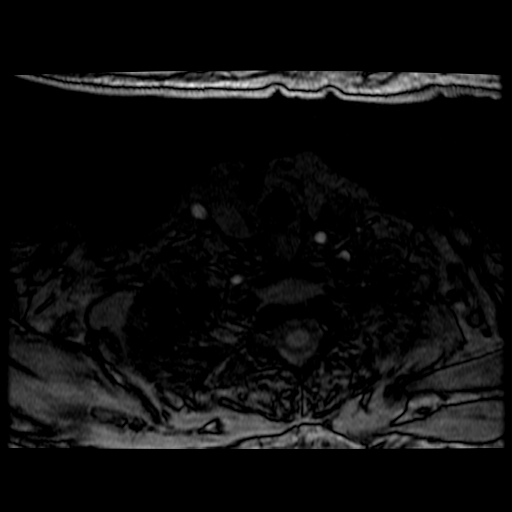
[im 63/140]
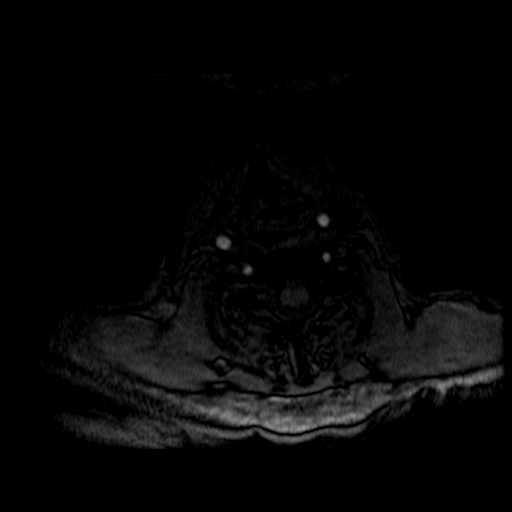
[im 71/140]
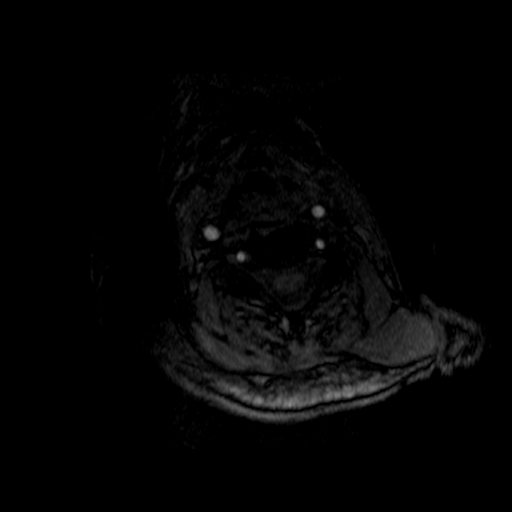
[im 80/140]
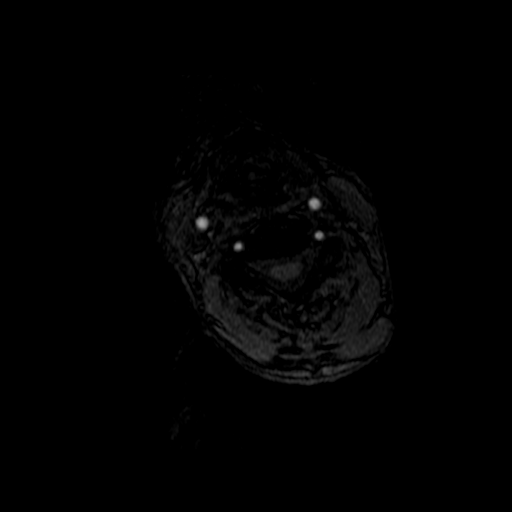
[im 98/140]
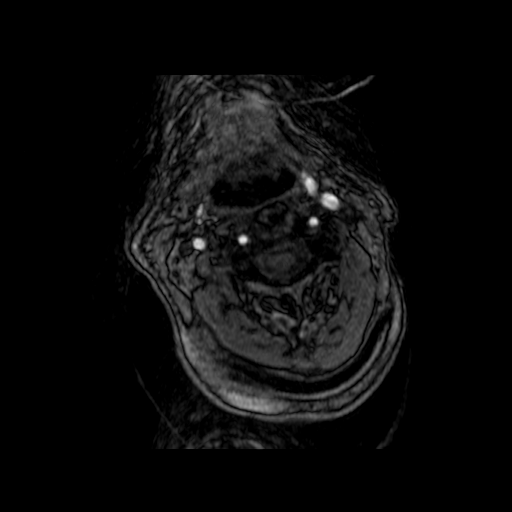
[im 116/140]
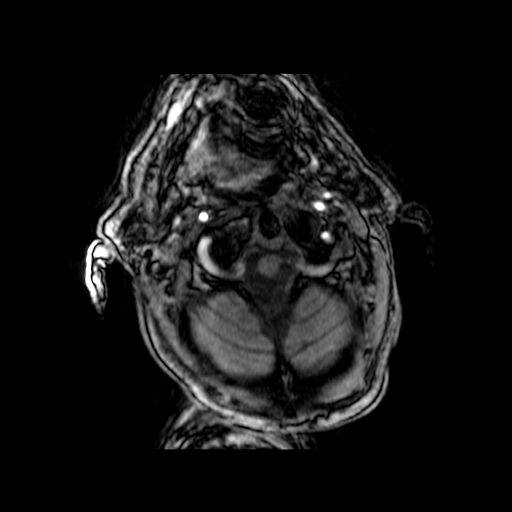
[im 119/140]
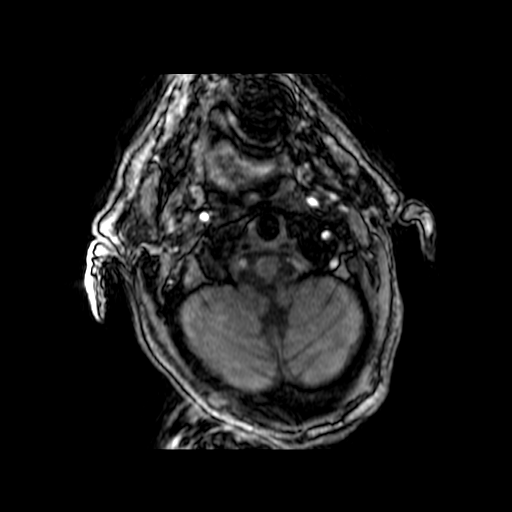
[im 134/140]
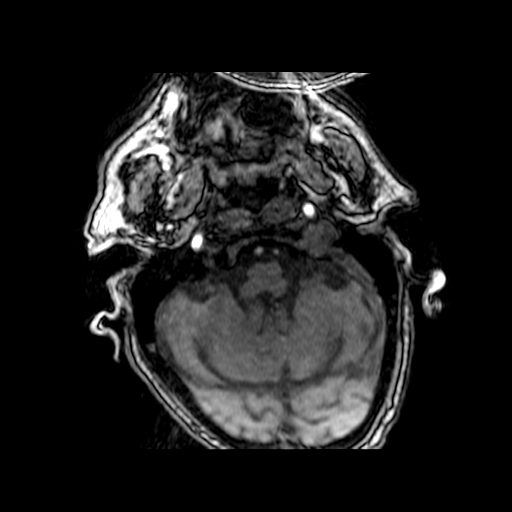

[19 of 48 positions shown; findings below may reference images not displayed]

FINDINGS: MRI HEAD

Motion artifact is present.

Brain: There is no acute infarction or intracranial hemorrhage.
There is no intracranial mass, mass effect, or edema. There is no
hydrocephalus or extra-axial fluid collection. Prominence of the
ventricles and sulci reflects parenchymal volume loss. Patchy foci
of T2 hyperintensity in the supratentorial white matter are
nonspecific but may reflect mild chronic microvascular ischemic
changes.

Vascular: Major vessel flow voids at the skull base are preserved.

Skull and upper cervical spine: Normal marrow signal is preserved.

Sinuses/Orbits: Paranasal sinuses are aerated. Orbits are
unremarkable.

Other: Sella is unremarkable.  Mastoid air cells are clear.

MRA HEAD

Intracranial internal carotid arteries are patent. There is a 1 mm
inferiorly directed outpouching from the distal supraclinoid right
ICA. Middle and anterior cerebral arteries are patent. Intracranial
vertebral arteries, basilar artery, posterior cerebral arteries are
patent. There is no significant stenosis.

MRA NECK

Common, internal, and external carotid arteries are patent.
Codominant extracranial vertebral arteries are patent. No stenosis.
IMPRESSION: No acute infarction, hemorrhage, or mass. Mild chronic microvascular
ischemic changes.

No proximal intracranial vessel occlusion or significant stenosis. 1
mm outpouching from the distal supraclinoid right ICA may reflect a
small aneurysm or infundibulum at the origin of an unseen small
vessel.

No occlusion or hemodynamically significant stenosis in the neck.

## 2021-04-30 IMAGING — MR MR HEAD W/O CM
9 of 12 series · 34 of 48 positions shown · non-contrast
Comparison: None.

CLINICAL DATA: Seizure, new-onset, no history of trauma; Seizure
disorder, clinical change; Stroke, follow up



[Series 8: DWI · axial · 3.0mm · 1.09mm/px · z∈[-85,+75]mm · 9 of 110 slices shown (1 of 4)]
[im 1/110]
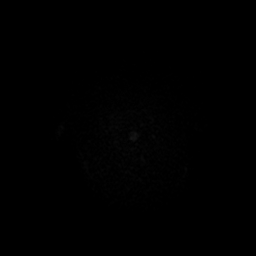
[im 14/110]
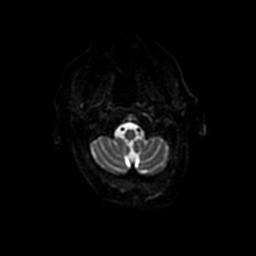
[im 28/110]
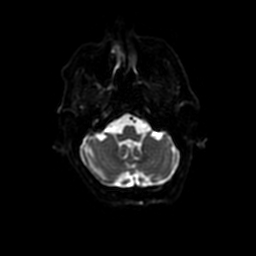
[im 41/110]
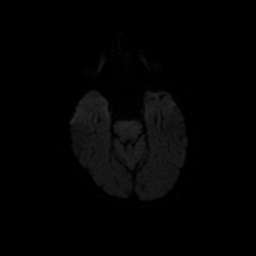
[im 55/110]
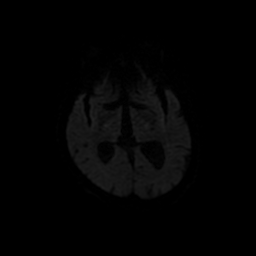
[im 69/110]
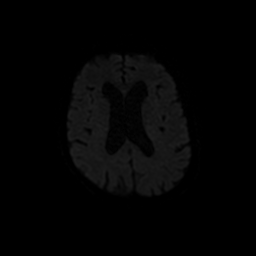
[im 82/110]
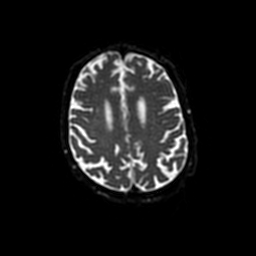
[im 96/110]
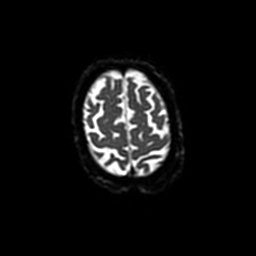
[im 110/110]
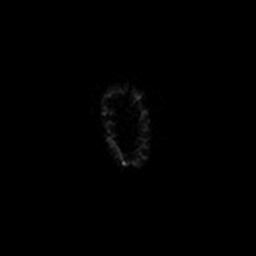

[Series 9: DWI · coronal · 5.0mm · 1.09mm/px · 6 of 74 slices shown (2 of 4)]
[im 1/74]
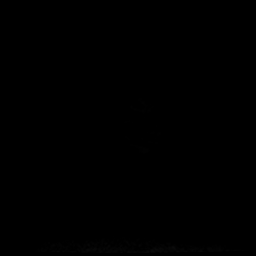
[im 15/74]
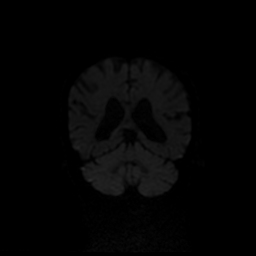
[im 30/74]
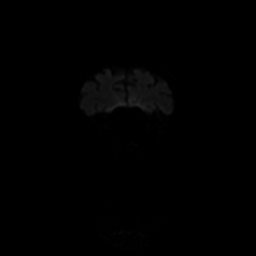
[im 44/74]
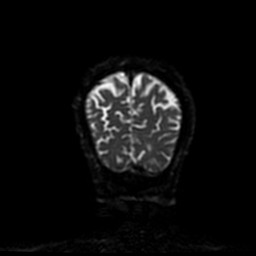
[im 59/74]
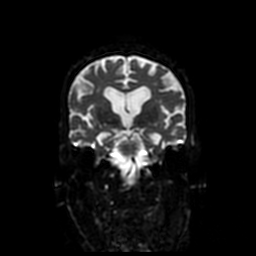
[im 74/74]
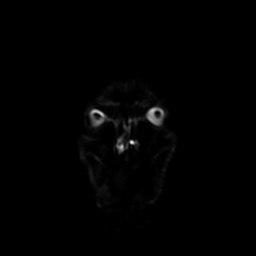

[Series 11: T2 · axial · 5.0mm · 0.43mm/px · z∈[-87,+66]mm · 2 of 27 slices shown (1 of 3)]
[im 1/27]
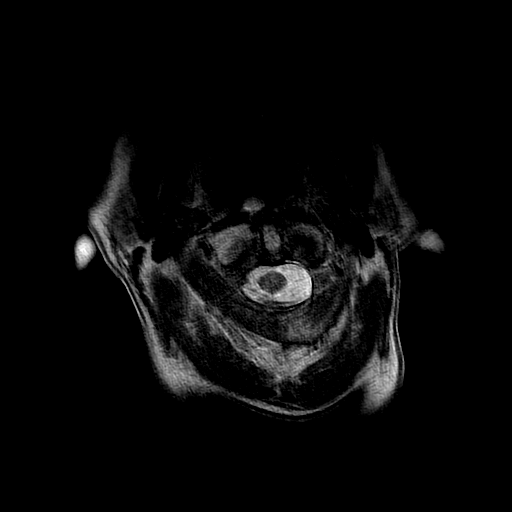
[im 27/27]
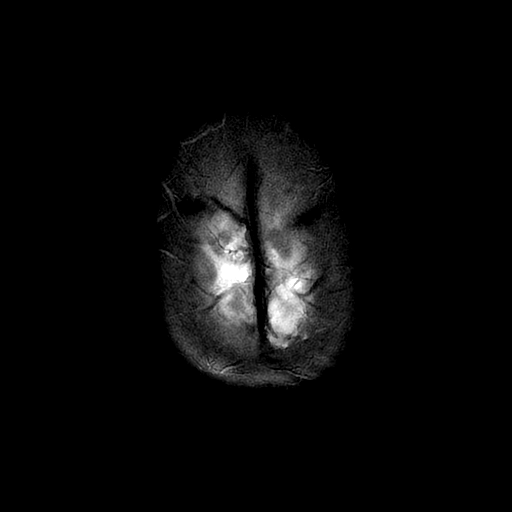

[Series 12: FLAIR · axial · 3.0mm · 0.43mm/px · z∈[-87,+66]mm · 2 of 27 slices shown (1 of 2)]
[im 1/27]
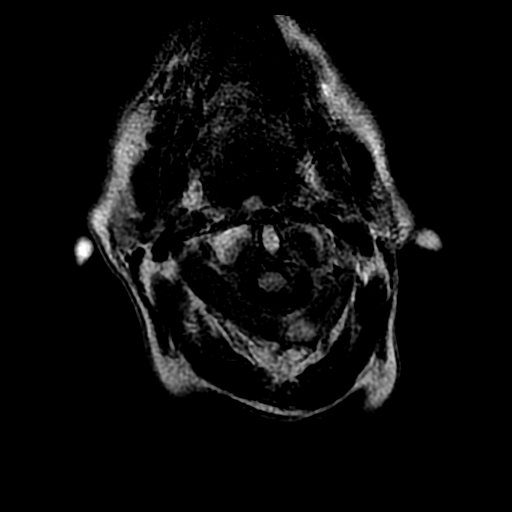
[im 27/27]
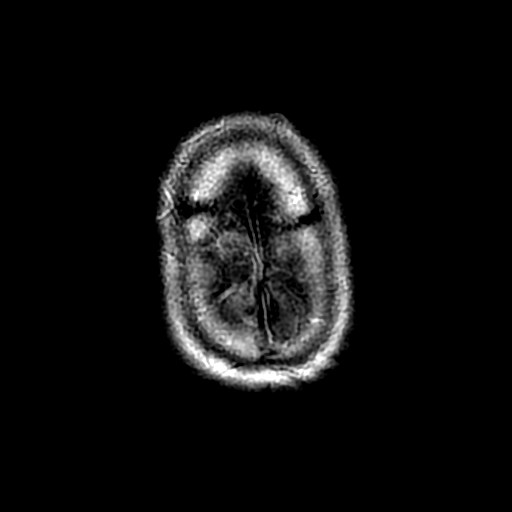

[Series 15: T2 · coronal · 5.0mm · 0.43mm/px · 2 of 24 slices shown (2 of 3)]
[im 1/24]
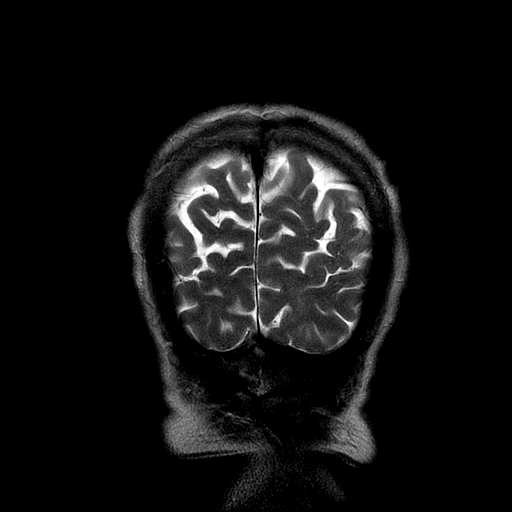
[im 24/24]
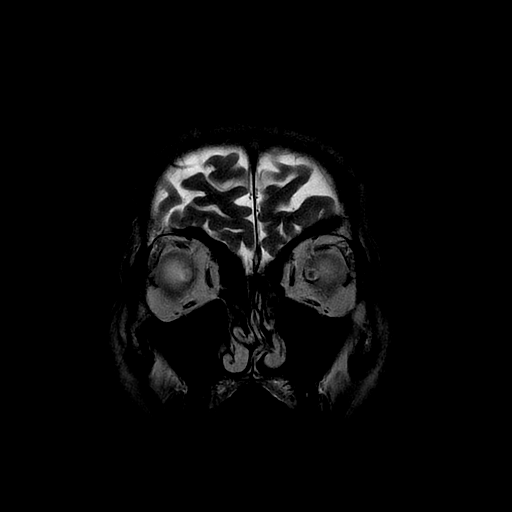

[Series 16: T2 · coronal · 3.0mm · 0.37mm/px · 2 of 37 slices shown (3 of 3)]
[im 1/37]
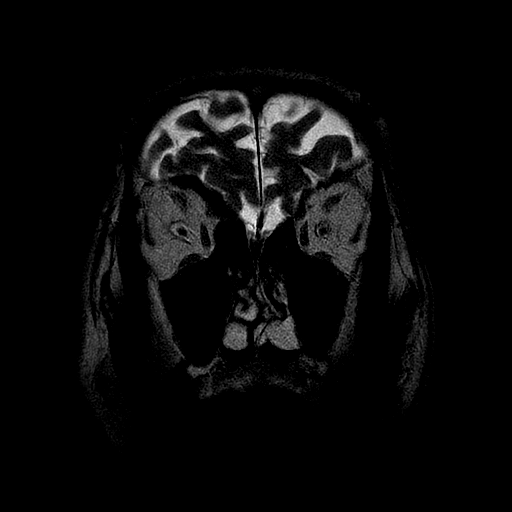
[im 19/37]
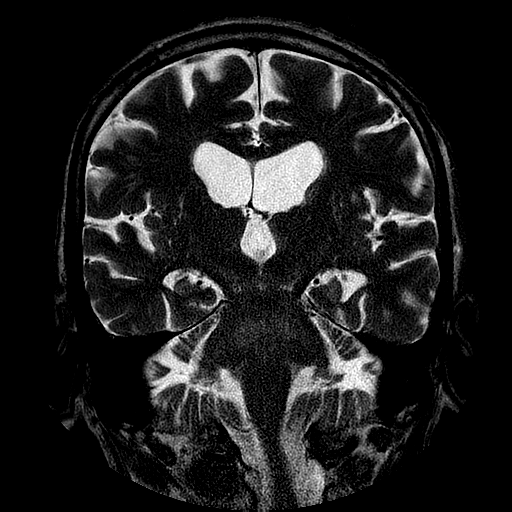

[Series 17: FLAIR · coronal · 3.0mm · 0.37mm/px · 3 of 37 slices shown (2 of 2)]
[im 1/37]
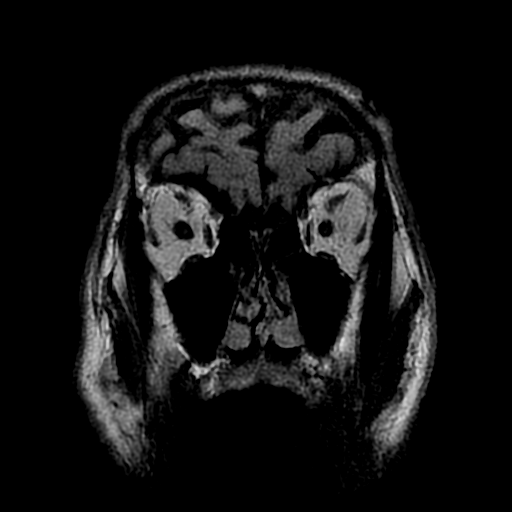
[im 19/37]
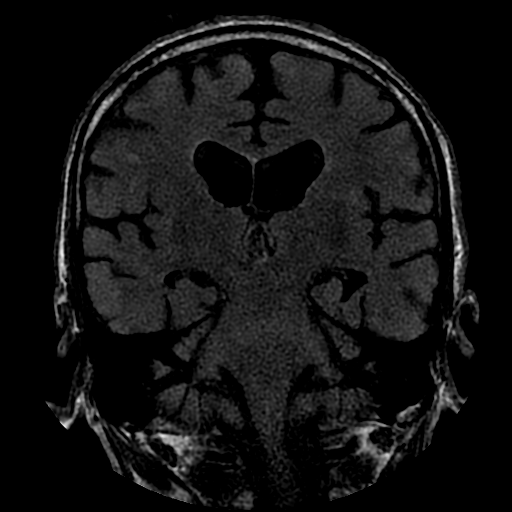
[im 37/37]
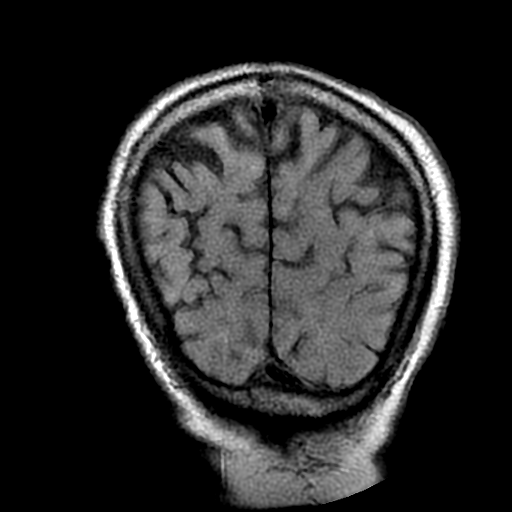

[Series 800: DWI · axial · 3.0mm · 1.09mm/px · z∈[-85,+75]mm · 5 of 55 slices shown (3 of 4)]
[im 1/55]
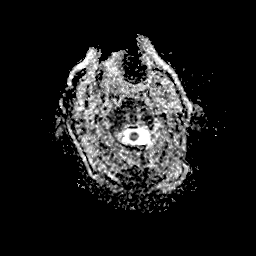
[im 14/55]
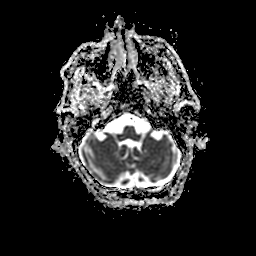
[im 28/55]
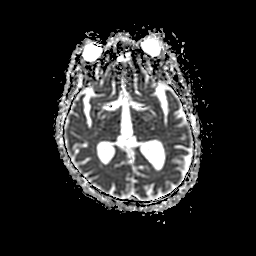
[im 41/55]
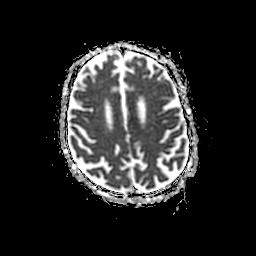
[im 55/55]
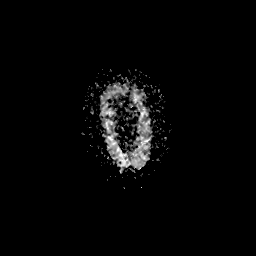

[Series 900: DWI · coronal · 5.0mm · 1.09mm/px · 3 of 37 slices shown (4 of 4)]
[im 1/37]
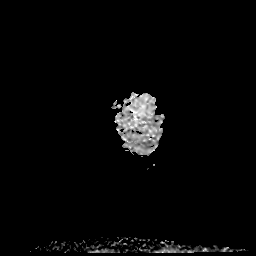
[im 19/37]
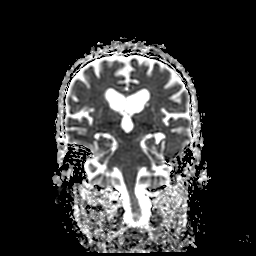
[im 37/37]
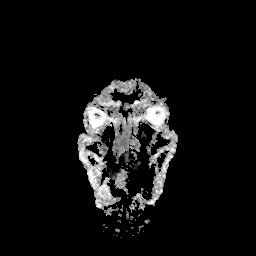

[34 of 48 positions shown; findings below may reference images not displayed]

FINDINGS: MRI HEAD

Motion artifact is present.

Brain: There is no acute infarction or intracranial hemorrhage.
There is no intracranial mass, mass effect, or edema. There is no
hydrocephalus or extra-axial fluid collection. Prominence of the
ventricles and sulci reflects parenchymal volume loss. Patchy foci
of T2 hyperintensity in the supratentorial white matter are
nonspecific but may reflect mild chronic microvascular ischemic
changes.

Vascular: Major vessel flow voids at the skull base are preserved.

Skull and upper cervical spine: Normal marrow signal is preserved.

Sinuses/Orbits: Paranasal sinuses are aerated. Orbits are
unremarkable.

Other: Sella is unremarkable.  Mastoid air cells are clear.

MRA HEAD

Intracranial internal carotid arteries are patent. There is a 1 mm
inferiorly directed outpouching from the distal supraclinoid right
ICA. Middle and anterior cerebral arteries are patent. Intracranial
vertebral arteries, basilar artery, posterior cerebral arteries are
patent. There is no significant stenosis.

MRA NECK

Common, internal, and external carotid arteries are patent.
Codominant extracranial vertebral arteries are patent. No stenosis.
IMPRESSION: No acute infarction, hemorrhage, or mass. Mild chronic microvascular
ischemic changes.

No proximal intracranial vessel occlusion or significant stenosis. 1
mm outpouching from the distal supraclinoid right ICA may reflect a
small aneurysm or infundibulum at the origin of an unseen small
vessel.

No occlusion or hemodynamically significant stenosis in the neck.

## 2021-04-30 IMAGING — MR MR MRA HEAD W/O CM
1 series · 19 of 48 positions shown · non-contrast
Comparison: None.

CLINICAL DATA: Seizure, new-onset, no history of trauma; Seizure
disorder, clinical change; Stroke, follow up



[Series 5: (id) mt fs · axial · 1.4mm · 0.43mm/px · z∈[-74,+15]mm · 19 of 136 slices shown]
[im 1/136]
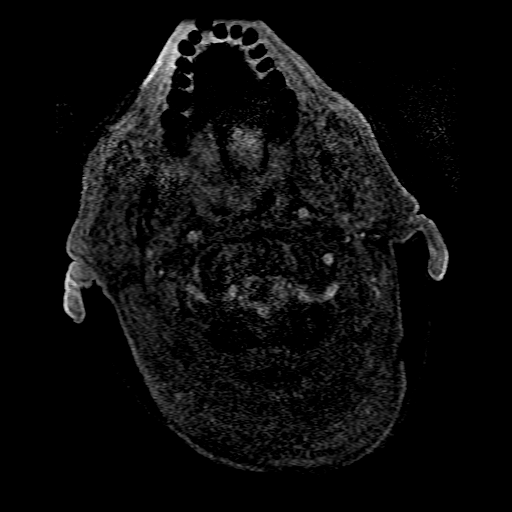
[im 3/136]
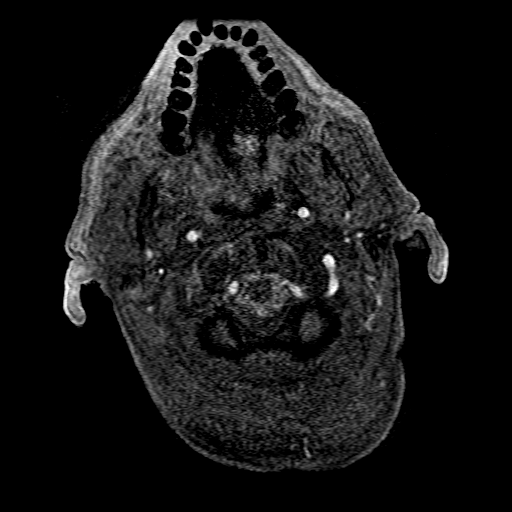
[im 6/136]
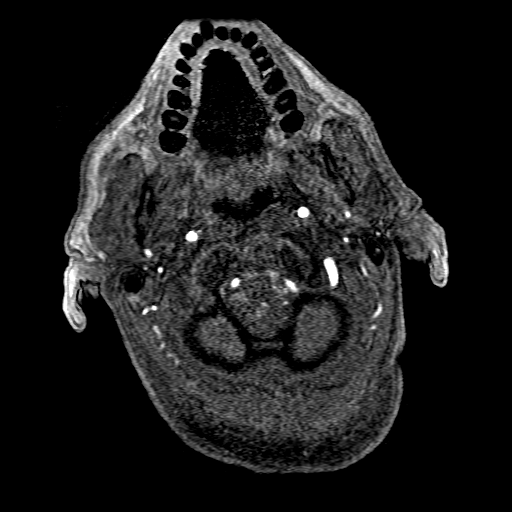
[im 9/136]
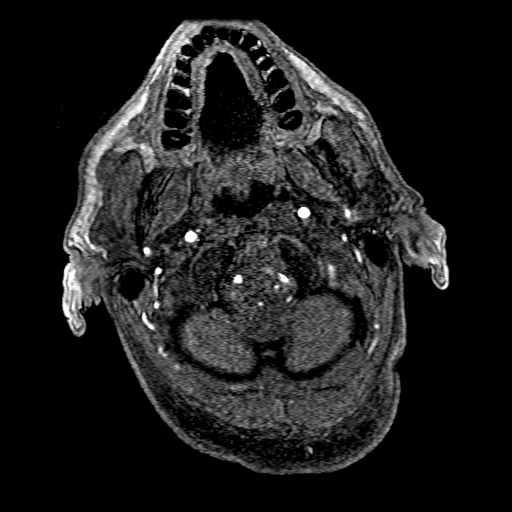
[im 12/136]
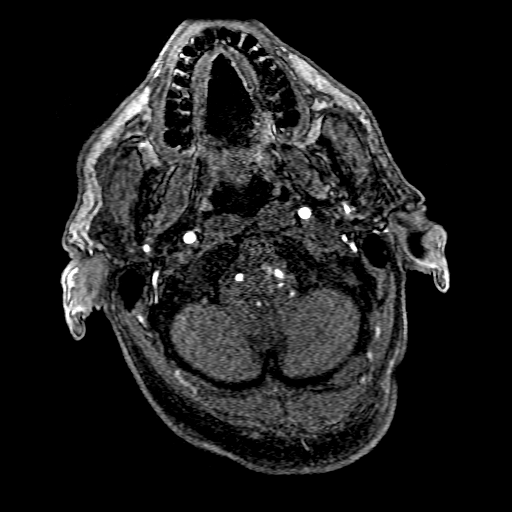
[im 15/136]
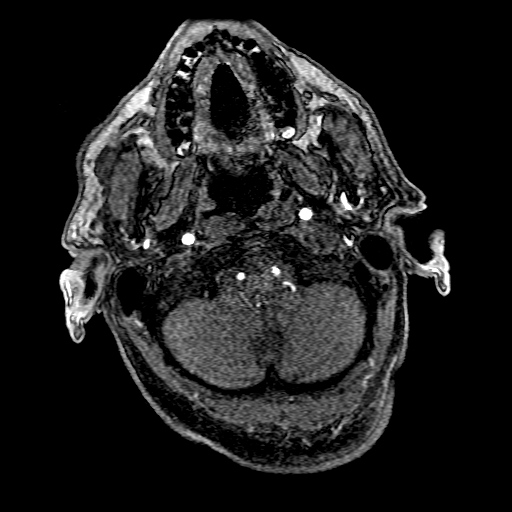
[im 18/136]
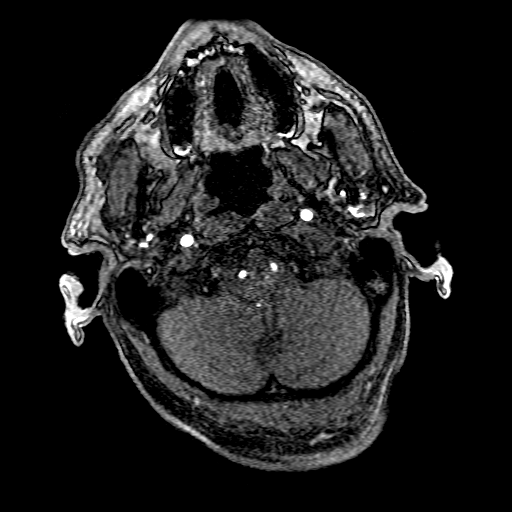
[im 21/136]
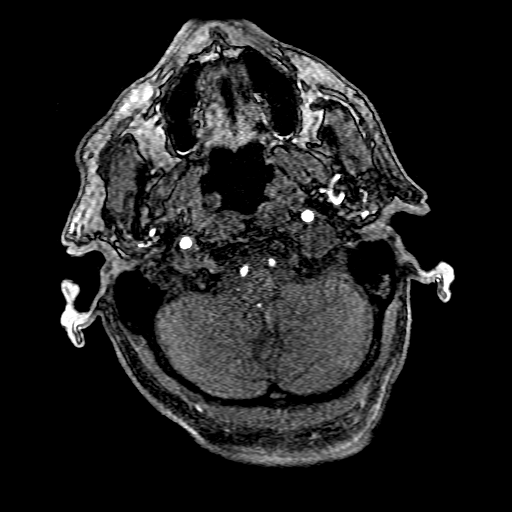
[im 23/136]
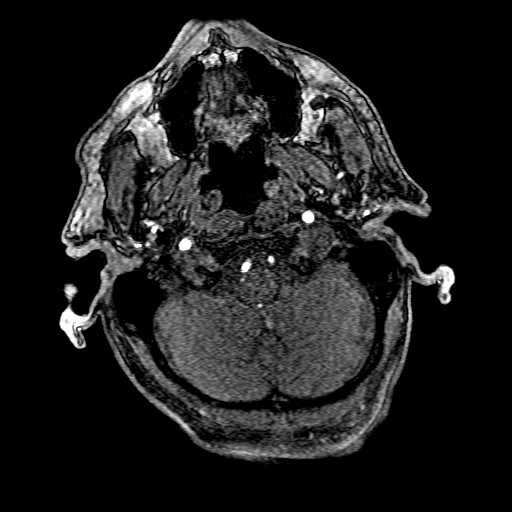
[im 26/136]
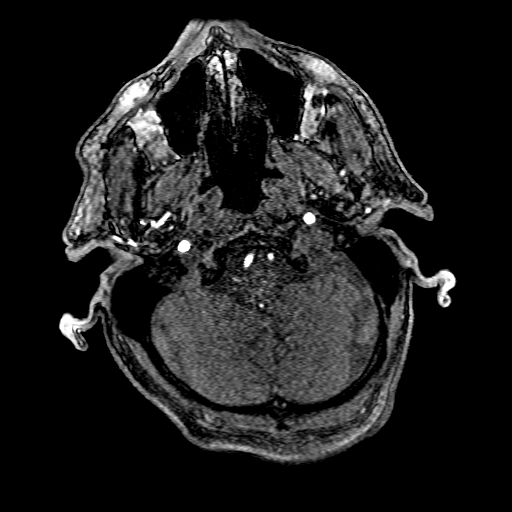
[im 29/136]
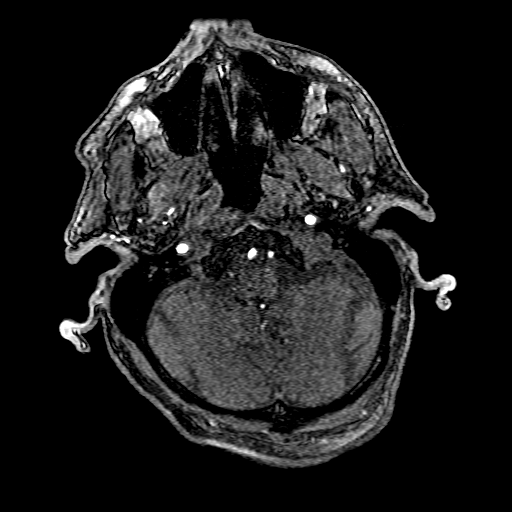
[im 44/136]
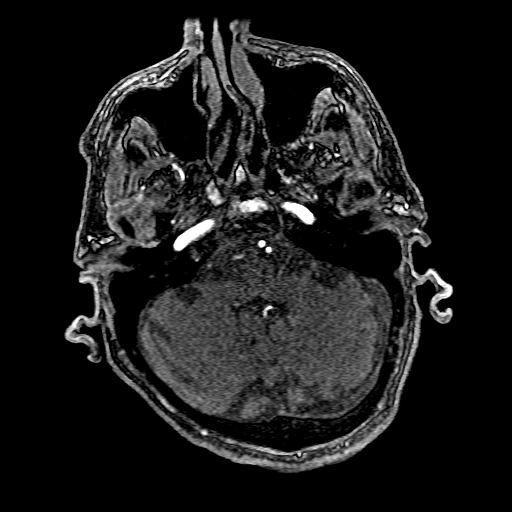
[im 61/136]
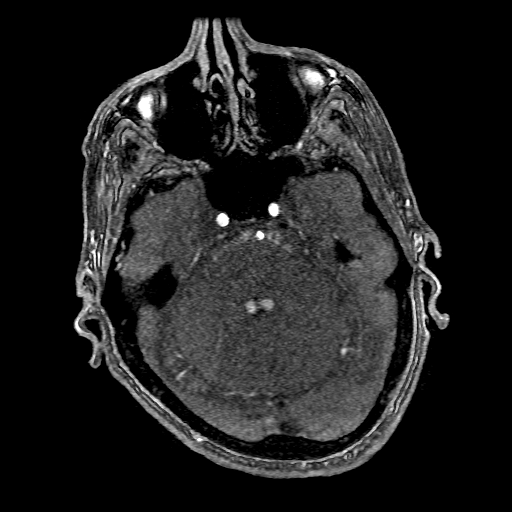
[im 69/136]
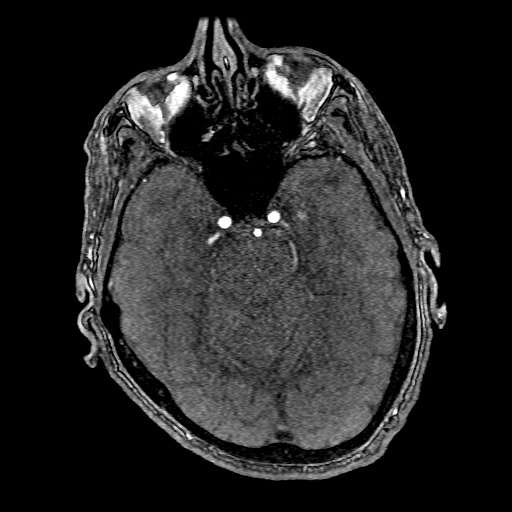
[im 78/136]
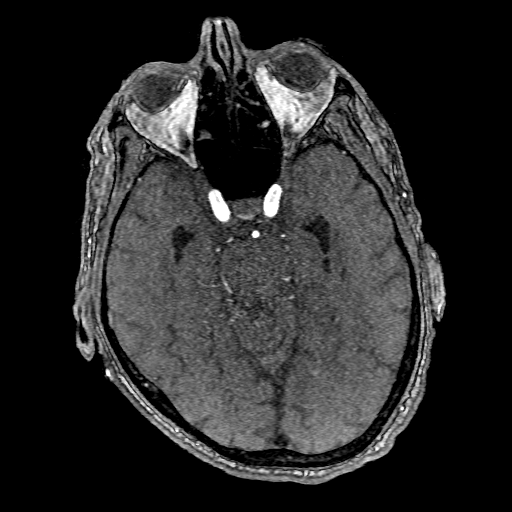
[im 95/136]
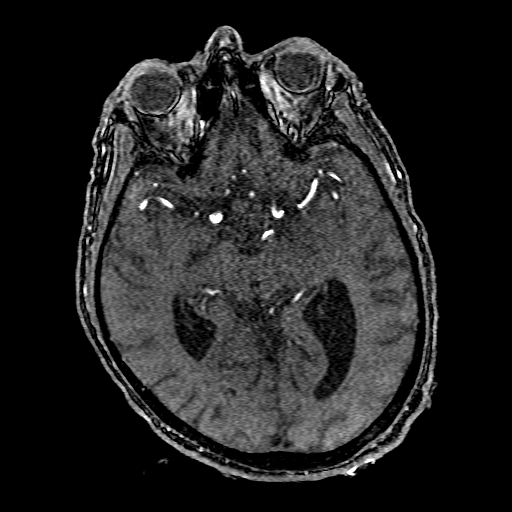
[im 113/136]
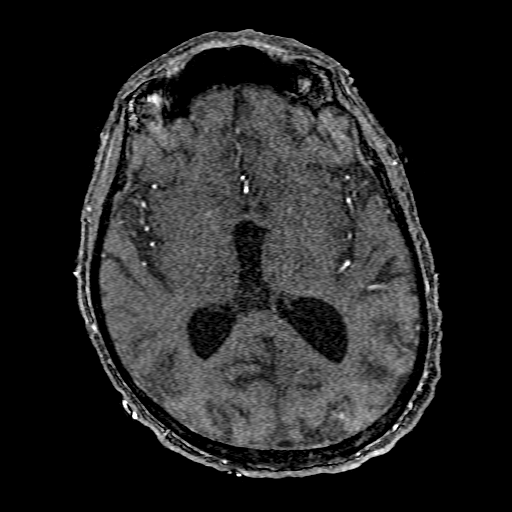
[im 115/136]
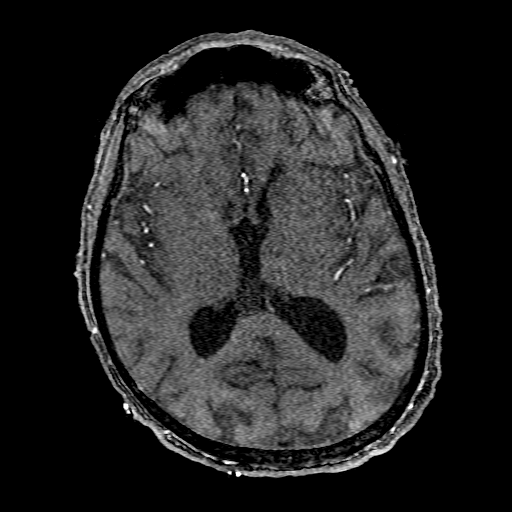
[im 130/136]
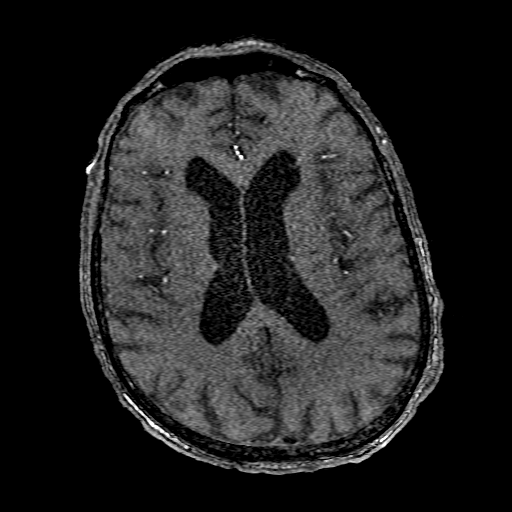

[19 of 48 positions shown; findings below may reference images not displayed]

FINDINGS: MRI HEAD

Motion artifact is present.

Brain: There is no acute infarction or intracranial hemorrhage.
There is no intracranial mass, mass effect, or edema. There is no
hydrocephalus or extra-axial fluid collection. Prominence of the
ventricles and sulci reflects parenchymal volume loss. Patchy foci
of T2 hyperintensity in the supratentorial white matter are
nonspecific but may reflect mild chronic microvascular ischemic
changes.

Vascular: Major vessel flow voids at the skull base are preserved.

Skull and upper cervical spine: Normal marrow signal is preserved.

Sinuses/Orbits: Paranasal sinuses are aerated. Orbits are
unremarkable.

Other: Sella is unremarkable.  Mastoid air cells are clear.

MRA HEAD

Intracranial internal carotid arteries are patent. There is a 1 mm
inferiorly directed outpouching from the distal supraclinoid right
ICA. Middle and anterior cerebral arteries are patent. Intracranial
vertebral arteries, basilar artery, posterior cerebral arteries are
patent. There is no significant stenosis.

MRA NECK

Common, internal, and external carotid arteries are patent.
Codominant extracranial vertebral arteries are patent. No stenosis.
IMPRESSION: No acute infarction, hemorrhage, or mass. Mild chronic microvascular
ischemic changes.

No proximal intracranial vessel occlusion or significant stenosis. 1
mm outpouching from the distal supraclinoid right ICA may reflect a
small aneurysm or infundibulum at the origin of an unseen small
vessel.

No occlusion or hemodynamically significant stenosis in the neck.

## 2021-04-30 IMAGING — DX DG CHEST 2V
1 series · 1 of 1 positions shown · non-contrast
Comparison: [DATE]

CLINICAL DATA: Seizures.

EXAM:
CHEST - 2 VIEW

[chest ap]
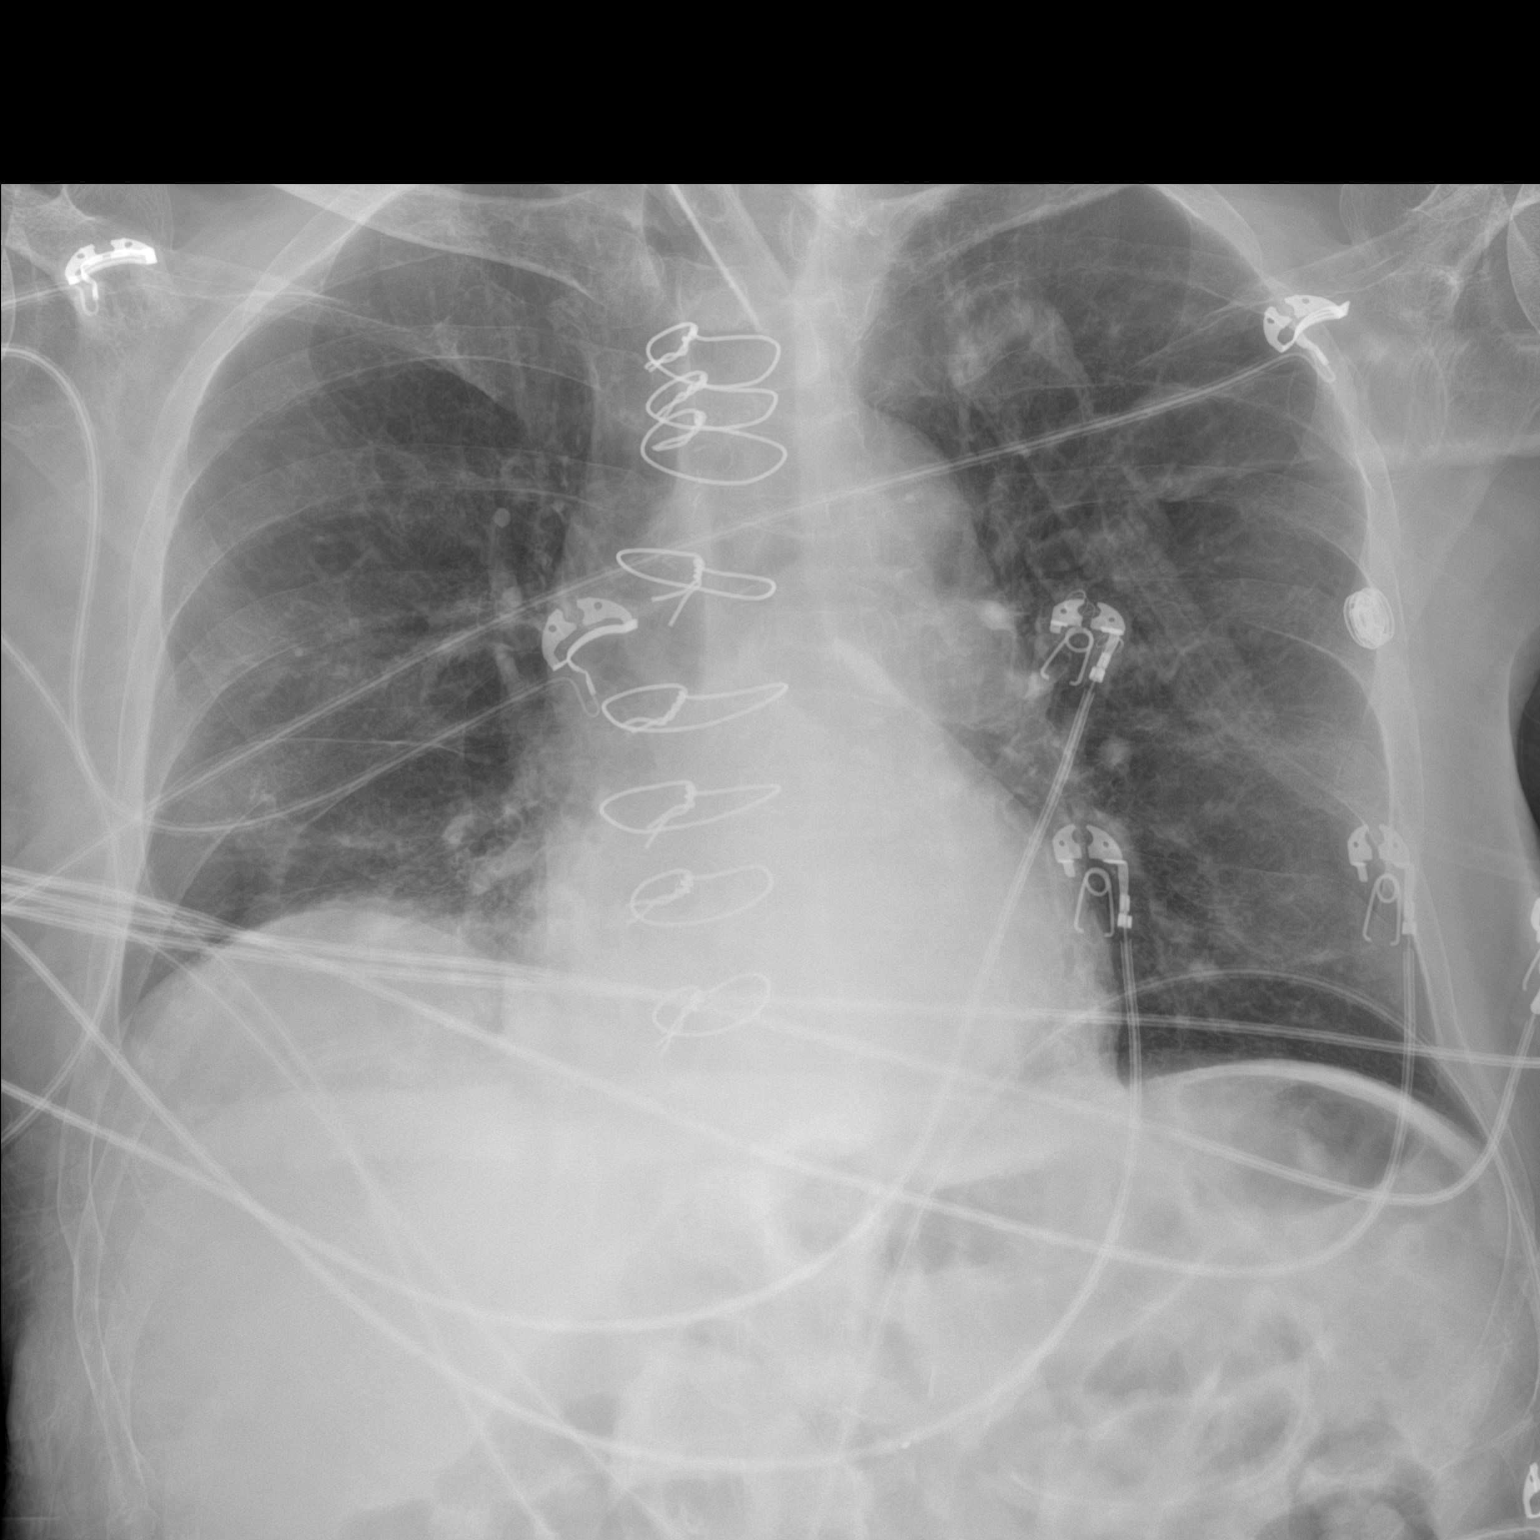

[1 of 1 positions shown; findings below may reference images not displayed]

FINDINGS: Multiple sternal wires are noted. Stable tracheostomy tube
positioning is seen. There is mild, stable elevation of the right
hemidiaphragm with mild right basilar atelectasis. There is no
evidence of a pleural effusion or pneumothorax. The heart size and
mediastinal contours are within normal limits. The visualized
skeletal structures are unremarkable.
IMPRESSION: Mild, stable right basilar atelectasis.

## 2021-04-30 MED ORDER — CHLORHEXIDINE GLUCONATE CLOTH 2 % EX PADS
6.0000 | MEDICATED_PAD | Freq: Every day | CUTANEOUS | Status: DC
  Administered 2021-04-30 – 2021-05-01 (×2): 6 via TOPICAL

## 2021-04-30 MED ORDER — MIDODRINE HCL 5 MG PO TABS
10.0000 mg | ORAL_TABLET | Freq: Three times a day (TID) | ORAL | Status: DC
  Administered 2021-04-30 – 2021-05-01 (×4): 10 mg
  Filled 2021-04-30 (×4): qty 2

## 2021-04-30 MED ORDER — JEVITY 1.5 CAL/FIBER PO LIQD
1000.0000 mL | ORAL | Status: DC
  Administered 2021-04-30: 1000 mL
  Filled 2021-04-30 (×2): qty 1000

## 2021-04-30 MED ORDER — LACTATED RINGERS IV BOLUS
500.0000 mL | Freq: Once | INTRAVENOUS | Status: AC
Start: 2021-04-30 — End: 2021-04-30
  Administered 2021-04-30: 500 mL via INTRAVENOUS

## 2021-04-30 MED ORDER — ORAL CARE MOUTH RINSE
15.0000 mL | OROMUCOSAL | Status: DC
  Administered 2021-04-30 – 2021-05-01 (×11): 15 mL via OROMUCOSAL

## 2021-04-30 MED ORDER — LACTATED RINGERS IV SOLN: INTRAVENOUS | Status: AC

## 2021-04-30 MED ORDER — ACETAMINOPHEN 325 MG PO TABS
650.0000 mg | ORAL_TABLET | Freq: Four times a day (QID) | ORAL | Status: DC | PRN
  Administered 2021-04-30 (×2): 650 mg
  Filled 2021-04-30 (×2): qty 2

## 2021-04-30 MED ORDER — CHLORHEXIDINE GLUCONATE 0.12% ORAL RINSE (MEDLINE KIT)
15.0000 mL | Freq: Two times a day (BID) | OROMUCOSAL | Status: DC
  Administered 2021-04-30 – 2021-05-01 (×3): 15 mL via OROMUCOSAL

## 2021-04-30 NOTE — Progress Notes (Signed)
?                                  PROGRESS NOTE                                             ?                                                                                                                     ?                                         ? ? Patient Demographics:  ? ? Austin Davis, is a 81 y.o. male, DOB - 04-10-1940, VPX:[id number] ? ?Outpatient Primary MD for the patient is Austin Davis, Austin Mutters, MD    LOS - 1  Admit date - 04/29/2021   ? ?Chief Complaint  ?Patient presents with  ? [name]  ? Seizures  ?    ? ?Brief Narrative (HPI from H&P)  - 81 y.o. 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 Patient BIB [facility] from [facility] early am of 4/10 due to staring spells at [facility], he was brought to [facility], ER he was seen by neurology team and admitted for further treatment of possible seizures. ? ? Subjective:  ? ? Austin Davis today has, No headache, No chest pain, no shortness of breath. ? ? Assessment  & Plan :  ? ?New Onset SZ - patient was on Keppra at [place] but not clear when his seizures initially started, he is currently undergoing long-term EEG, head CT was nonacute, MRI if possible will be done, neurology team on board.  Currently on Keppra which will be continued. ? 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  Supportive care, pulmonary and critical care medicine managing ventilator.  We will also involve palliative care for goals of care  ? ?Recent UTI -  f/u on culture data , on empiric antibiotic which will be continued, will monitor bladder scans as he has history of neurogenic bladder. ?   ?Infiltrate cxr  - no fever , sat stable after suctioning, currently on Keflex continue and monitor clinically.  Pulmonary critical care managing ventilator and pulmonary issues ?   ?Chronic hypotension - hydrate, increase midodrine dose, check baseline cortisol, TSH and monitor. ?  ?Essential tremor -continue primidone  ?  ?Chronic systolic/combined diastolic  HF - echo 4/62: grade 1 diastolic ef 70-35%, blood  pressure too low for beta-blocker, ACE/ARB, currently cannot use diuretics due to hypotension.  Monitor ?  ?Afib s/p MAZE - in sinus, not on anticoagulation chronically. ?  ? H/O DVT - currently not on therapeutic anticoagulation. ?  ?Failure to thrive in adult - Protein Cal malnutrition, nutrition consult  ? 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 ? ?   ? ?Condition -  Guarded ? ?Family Communication  :  daughter  Austin Davis - [phone] unfortunately she was extremely frustrated further care he has received in the last 1 year and had multiple issues with previous physicians, she was informed that I was unable to help her from the past standpoint but will keep her informed from what happens now on. ? ?Code Status :  Full Code ? ?Consults  :  Neuro, PCCM, palliative care ? ?PUD Prophylaxis :  PPI ? ? Procedures  :    ? ?CT - 1. No CT evidence for acute intracranial abnormality. 2. Atrophy and chronic small vessel ischemic changes of the white matter. ? ?EEG - This study is within normal limits. No seizures or epileptiform discharges were seen throughout the recording.  ? ?   ? ?Disposition Plan  :   ? ?Status is: Inpatient ? ?DVT Prophylaxis  :   ? ?enoxaparin (LOVENOX) injection 30 mg Start: 04/30/21 1000 ?  ? ?Lab Results  ?Component Value Date  ? PLT 228 04/30/2021  ? ? ?Diet :  ?Diet Order   ? ?       ?  Diet NPO time specified  Diet effective now       ?  ? ?  ?  ? ?  ?  ? ?Inpatient Medications ? ?Scheduled Meds: ? vitamin C  500 mg Per Tube Daily  ? cephALEXin  500 mg Per Tube TID  ? chlorhexidine gluconate  (MEDLINE KIT)  15 mL Mouth Rinse BID  ? Chlorhexidine Gluconate Cloth  6 each Topical Q0600  ? cholecalciferol  2,000 Units Per Tube Daily  ? clonazePAM  0.5 mg Per Tube BID  ? enoxaparin (LOVENOX) injection  30 mg Subcutaneous Q24H  ? escitalopram  10 mg Per Tube Daily  ? feeding supplement (PROSource TF)  45 mL Per Tube BID  ? folic acid  1 mg Per Tube Daily  ? guaiFENesin  20 mL Per Tube TID  ? levalbuterol  1.25 mg Nebulization Q6H  ? levETIRAcetam  500 mg Per Tube BID  ? levothyroxine  25 mcg Per Tube QAC breakfast  ? loratadine  10 mg Per Tube Daily  ? mouth rinse  15 mL Mouth Rinse 10 times per day  ? midodrine  10 mg Per Tube TID WC  ? multivitamin with minerals  1 tablet Per Tube Daily  ? omega-3 acid ethyl esters  1 capsule Per Tube Daily  ? pantoprazole sodium  40 mg Per Tube Daily  ? polyethylene glycol  17 g Per Tube QODAY  ? saccharomyces boulardii  250 mg Per Tube Daily  ? sodium chloride flush  3 mL Intravenous Q12H  ? ?Continuous Infusions: ? feeding supplement (JEVITY 1.5 CAL/FIBER) 1,000 mL (04/30/21 1110)  ? lactated ringers    ? lactated ringers    ? ?PRN Meds:.acetaminophen, docusate, hydrOXYzine, ipratropium-albuterol, lactulose, ondansetron **OR** ondansetron (ZOFRAN) IV, polyethylene glycol, sodium chloride ? ?Antibiotics  :   ? ?Anti-infectives (From admission, onward)  ? ? Start     Dose/Rate Route Frequency Ordered Stop  ? 04/30/21 0030  cephALEXin (KEFLEX) 250 MG/5ML  suspension 500 mg       ? 500 mg Per Tube 3 times daily 04/29/21 2334 05/08/21 2159  ? ?  ? ? ? Time Spent in minutes  30 ? ? ?Austin Davis M.D on 04/30/2021 at 11:47 AM ? ?To page go to [email or web address]  ? ?[facility] -  Office  [phone] ? ?See all Orders from today for further details ? ? ? Objective:  ? ?Vitals:  ? 04/30/21 0700 04/30/21 0717 04/30/21 0800 04/30/21 0830  ?BP: 109/74  102/66 102/66  ?Pulse: (!) 113  87 80  ?Resp: (!) 22  18 16   ?Temp:  97.9 ?F (36.6 ?C)    ?TempSrc:  Oral    ?SpO2: 99%  100% 96%   ?Weight:      ?Height:      ? ? ?Wt Readings from Last 3 Encounters:  ?04/30/21 54.6 kg  ?04/16/21 61.8 kg  ?03/26/21 62.6 kg  ? ? ? ?Intake/Output Summary (Last 24 hours) at 04/30/2021 1147 ?Last data filed at 04/30/2021 0932 ?Gross per 24 hour  ?Intake 2103 ml  ?Output 200 ml  ?Net 1903 ml  ? ? ? ?Physical Exam ? ?Awake Alert, has baseline tremors, tracheostomy in place hooked to a ventilator, tube in place, male pure wick in place. ?[place].AT,PERRAL ?Supple Neck, No JVD,   ?Symmetrical Chest wall movement, Good air movement bilaterally, CTAB ?RRR,No Gallops,Rubs or new Murmurs,  ?+ve B.Sounds, Abd Soft, No tenderness,   ?No Cyanosis, Clubbing or edema  ?  ? ?RN pressure injury documentation: ?Pressure Injury 09/25/20 Vertebral column Left Deep Tissue Pressure Injury - Purple or maroon localized area of discolored intact skin or blood-filled blister due to damage of underlying soft tissue from pressure and/or shear. blanchable redness along le (Active)  ?09/25/20 2253  ?Location: Vertebral column  ?Location Orientation: Left  ?Staging: Deep Tissue Pressure Injury - Purple or maroon localized area of discolored intact skin or blood-filled blister due to damage of underlying soft tissue from pressure and/or shear.  ?Wound Description (Comments): blanchable redness along left side vetebral column  ?Present on Admission: -- (noticed on assessment when transferred)  ?   ?Pressure Injury 04/30/21 Sacrum Medial;Lower Stage 1 -  Intact skin with non-blanchable redness of a localized area usually over a bony prominence. (Active)  ?04/30/21 0041  ?Location: Sacrum  ?Location Orientation: Medial;Lower  ?Staging: Stage 1 -  Intact skin with non-blanchable redness of a localized area usually over a bony prominence.  ?Wound Description (Comments):   ?Present on Admission: Yes  ?Dressing Type Foam - Lift dressing to assess site every shift 04/30/21 0041  ? ? ? Data Review:  ? ? ?CBC ?Recent Labs  ?Lab 04/28/21 ?5638 04/29/21 ?1729  04/29/21 ?1806 04/30/21 ?0107  ?WBC 15.7* 11.1*  --  8.3  ?HGB 11.7* 9.9* 16.3 9.5*  ?HCT 36.3* 32.6* 48.0 30.1*  ?PLT 354 325  --  228  ?MCV 89.9 92.1  --  89.6  ?MCH 29.0 28.0  --  28.3  ?MCHC 32.2 30.4  --  31.6  ?

## 2021-04-30 NOTE — Progress Notes (Signed)
LTM maint complete - no skin breakdown MR compatible leads. ?Reconnected leads after MRI, study running ?[facility] monitored. ?

## 2021-04-30 NOTE — Progress Notes (Signed)
Initial Nutrition Assessment ? ?DOCUMENTATION CODES:  ? ?Severe malnutrition in context of chronic illness ? ?INTERVENTION:  ? ?Tube feeding via PEG: ?- Jevity 1.5 @ 55 ml/hr (1320 ml/day) ?- ProSource TF 45 ml BID ? ?Tube feeding regimen provides 2060 kcal, 106 grams of protein, and 1003 ml of H2O.  ? ?- Continue MVI with minerals daily per tube ? ?NUTRITION DIAGNOSIS:  ? ?Severe Malnutrition related to chronic illness (COPD, CHF, chronic respiratory failure) as evidenced by severe fat depletion, severe muscle depletion. ? ?GOAL:  ? ?Patient will meet greater than or equal to 90% of their needs ? ?MONITOR:  ? ?Vent status, Labs, Weight trends, TF tolerance, Skin ? ?REASON FOR ASSESSMENT:  ? ?Ventilator, Consult ?Enteral/tube feeding initiation and management ? ?ASSESSMENT:  ? ?81 year old male who presented to the ED on 4/10 from [facility] with concern for seizure-like activity. PMH of essential tremor, COPD, chronic respiratory failure s/p trach requiring chronic vent support, CHF, atrial fibrillation, DVT, GIB, dysphagia, neurogenic bladder, failure to thrive. ? ?Discussed pt with RN. PEG functioning without issue. Reviewed Dietetic Intern note from previous admission. Also reviewed home meds list. At [facility], pt receives Jevity 1.5 tube feeds at 50 ml/hr with Pro-stat 30 ml BID. This provides 2000 kcal and 107 grams of protein daily. ? ?Pt asleep at time of RD visit and did not awaken to RD voice. Pt mouthing words and moving hands but eyes remained closed. ? ?Reviewed weight history in chart. Pt's weight has fluctuated between 48-62 kg over the last year. Suspect weight fluctuations are related in part to fluid status. If current weight is accurate, pt has experienced an 8 kg weight loss since 03/26/21. This is a 12.8% weight loss in just over 1 month which is significant for timeframe. However, fluid status and diuresis may be playing a role in weight loss. Unsure how much true dry weight loss pt has  experienced. ? ?Patient is on chronic ventilator support via trach ?MV: 7.3 L/min ?Temp (24hrs), Avg:98.3 ?F (36.8 ?C), Min:97.8 ?F (36.6 ?C), Max:98.9 ?F (37.2 ?C) ? ?Medications reviewed and include: vitamin C 500 mg daily, cholecalciferol 1275 units daily, folic acid, MVI with minerals daily, lovaza, protonix, miralax, florastor ?IVF: LR @ 75 ml/hr ? ?Labs reviewed: hemoglobin 9.5 ?CBG's: 76-103 ? ?NUTRITION - FOCUSED PHYSICAL EXAM: ? ?Flowsheet Row Most Recent Value  ?Orbital Region Severe depletion  ?Upper Arm Region Severe depletion  ?Thoracic and Lumbar Region Severe depletion  ?Buccal Region Moderate depletion  ?Temple Region Moderate depletion  ?Clavicle Bone Region Severe depletion  ?Clavicle and Acromion Bone Region Severe depletion  ?Scapular Bone Region Severe depletion  ?Dorsal Hand Severe depletion  ?Patellar Region Severe depletion  ?Anterior Thigh Region Severe depletion  ?Posterior Calf Region Severe depletion  ?Edema (RD Assessment) Mild  [BLE]  ?Hair Reviewed  ?Eyes Unable to assess  ?Mouth Reviewed  ?Skin Reviewed  ?Nails Reviewed  ? ?  ? ? ?Diet Order:   ?Diet Order   ? ?       ?  Diet NPO time specified  Diet effective now       ?  ? ?  ?  ? ?  ? ? ?EDUCATION NEEDS:  ? ?Not appropriate for education at this time ? ?Skin:  Skin Assessment: ?Skin Integrity Issues: ?DTI: vertebral column ?Stage I: sacrum ?Incisions: chest ? ?Last BM:  04/29/21 ? ?Height:  ? ?Ht Readings from Last 1 Encounters:  ?04/29/21 5\' 7"  (1.702 m)  ? ? ?Weight:  ? ?  Wt Readings from Last 1 Encounters:  ?04/30/21 54.6 kg  ? ? ?BMI:  Body mass index is 18.85 kg/m?. ? ?Estimated Nutritional Needs:  ? ?Kcal:  2000-2200 ? ?Protein:  100-120 grams ? ?Fluid:  2.0 L ? ? ? ?Gustavus Bryant, MS, RD, LDN ?Inpatient Clinical Dietitian ?Please see AMiON for contact information. ? ?

## 2021-04-30 NOTE — Progress Notes (Signed)
Brief Neuro Update note: ? ?Briefly, [name] is a 81 year old male with three episodes of gaze deviation and decreased responsiveness (?  Behavioral arrest,?  Aphasia,?  Unresponsiveness). He is admitted for spell characterization on cEEG and for MRI Brain and MRA head and neck to evaluate for any significant vessel disease. ? ?- cEEG with no spells captured so far and so will keep him on overnight. ?- MRIs and MRAs after cEEG. ?- neurology will continue to follow along. ? ?Donnetta Simpers ?[facility] ?Pager Number [phone] ?

## 2021-04-30 NOTE — TOC Progression Note (Signed)
Transition of Care (TOC) - Initial/Assessment Note  ? ? ?Patient Details  ?Name: Austin Davis ?MRN: [id number] ?Date of Birth: [birth year] ? ?Transition of Care (TOC) CM/SW Contact:    ?Paulene Floor Gissella Niblack, LCSWA ?Phone Number: ?04/30/2021, 10:36 AM ? ?Clinical Narrative:                 ?CSW spoke with Levada Dy in admissions at [facility].  The patient is LTC at the facility and can return when medically ready. ? ?  ?  ? ? ?Patient Goals and CMS Choice ?  ?  ?  ? ?Expected Discharge Plan and Services ?  ?  ?  ?  ?  ?                ?  ?  ?  ?  ?  ?  ?  ?  ?  ?  ? ?Prior Living Arrangements/Services ?  ?  ?  ?       ?  ?  ?  ?  ? ?Activities of Daily Living ?  ?  ? ?Permission Sought/Granted ?  ?  ?   ?   ?   ?   ? ?Emotional Assessment ?  ?  ?  ?  ?  ?  ? ?Admission diagnosis:  Seizure ([place]) [R56.9] ?Patient Active Problem List  ? Diagnosis Date Noted  ? Pressure injury of skin 04/30/2021  ? Seizure ([place]) 04/29/2021  ? Pneumothorax 04/11/2021  ? Ventilator dependent ([place])   ? Aspiration pneumonia ([place]) 03/25/2021  ? 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 Obstructive sleep apnea 10/24/2020  ? Chronic obstructive pulmonary disease ([place]) 10/24/2020  ? Chronic heart failure with preserved ejection fraction ([place]) 10/24/2020  ? Unintentional weight loss 09/20/2020  ? Acute metabolic encephalopathy 94/80/1655  ? SVT (supraventricular tachycardia) ([place]) 09/20/2020  ? Protein-calorie malnutrition, severe 09/17/2020  ? Acute on chronic respiratory failure with hypoxia and hypercapnia ([place]) 09/16/2020  ? Failure to thrive in adult 09/16/2020  ? AF (paroxysmal atrial fibrillation) ([place]) 09/16/2020  ? Stage 1 skin ulcer of sacral region ([place]) 09/16/2020  ? Hypotension 06/30/2020  ? GIB (gastrointestinal bleeding) 02/17/2017  ? Rectal bleeding 02/16/2017  ? 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 Essential tremor 02/19/2015  ? Status post mitral valve annuloplasty and MAZE 2005 12/12/2014  ? HLD (hyperlipidemia) 05/05/2013   ? DOE (dyspnea on exertion) 04/22/2013  ? Fatigue 04/22/2013  ? Atrial fibrillation ([place]) 05/08/2012  ? ?PCP:  Aaron Edelman, MD ?Pharmacy:  No Pharmacies Listed ? ? ? ?Social Determinants of Health (SDOH) Interventions ?  ? ?Readmission Risk Interventions ?   ? View : No data to display.  ?  ?  ?  ? ? ? ?

## 2021-04-30 NOTE — Progress Notes (Signed)
OT Cancellation Note ? ?Patient Details ?Name: Austin Davis ?MRN: [id number] ?DOB: 01-Dec-1940 ? ? ?Cancelled Treatment:    Reason Eval/Treat Not Completed: OT screened, no needs identified, will sign off- pt is LTC patient at [facility].  No acute OT needs identified and OT will sign off.  ? ?Jolaine Artist, OT ?Acute Rehabilitation Services ?Pager [phone] ?Office [phone] ? ? ?Delight Stare ?04/30/2021, 10:48 AM ?

## 2021-04-30 NOTE — Progress Notes (Signed)
LTM EEG hooked up and running - no initial skin breakdown - push button tested - neuro notified. [facility] monitoring.  

## 2021-04-30 NOTE — Plan of Care (Signed)

## 2021-04-30 NOTE — Progress Notes (Signed)
PCCM brief update: ? ?Reviewed admission and consult note ? ?Patient remains comfortable on home vent setting and HD stable. ? ?PCCM will follow up again on 4/12 ? ?[employer]., MSN, APRN, AGACNP-BC ?[facility]  ?04/30/2021 , 9:55 AM ? ?Please see [email or web address] for pager details ? ?If no response, please call [phone] ?After hours, please call Elink at [phone] ? ?

## 2021-04-30 NOTE — Consult Note (Signed)
? ?NAME:  LOWEN BARRINGER, MRN:  [id number], DOB:  26-Oct-1940, LOS: 1 ?ADMISSION DATE:  04/29/2021, CONSULTATION DATE:  4/11 ?REFERRING MD:  Dr. Marcello Moores [facility]), CHIEF COMPLAINT:  Chronic trach/vent  ? 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 Recently admitted with bacteremia/pneumonia complicated by pneumothorax.Presented to [facility] ED 4/10 with seizure. He was initially loaded with Keppra in the ED and was discharged back to [facility] with plans for scheduled Keppra. Also started on keflex for UTI. He did unfortunately have recurrent sz upon arrival back to [facility] and was transported again to the ED. He was admitted to the hospitalist service with neurology consultation. PCCM was asked to see for ventilator management.   ? ?Pertinent  Medical History  ? has a past medical history of CHF (congestive heart failure) ([place]), Coronary artery disease, Essential tremor, GI bleed (2019), Headache, Low blood sugar, Mitral regurgitation, Mitral valve prolapse, Osteopenia (2021), and Paroxysmal atrial fibrillation ([place]). ? ?Significant Hospital Events: ?Including procedures, antibiotic start and stop dates in addition to other pertinent events   ?4/10 presented to ED with sz. Started on Keppra and dc from ED. Then back with additional sz.  ? ?Interim History / Subjective:  ? ? ?Objective   ?Blood pressure 114/75, pulse 61, temperature 98.9 ?F (37.2 ?C), temperature source Oral, resp. rate 16, height 5\' 7"  (1.702 m), weight 54.6 kg, SpO2 100 %. ?   ?Vent Mode: PCV ?FiO2 (%):  [35 %-75 %] 35 % ?Set Rate:  [16 bmp] 16 bmp ?PEEP:  [5 cmH20] 5 cmH20 ?Plateau Pressure:  [18 cmH20] 18 cmH20  ? ?Intake/Output Summary (Last 24 hours) at 04/30/2021 0300 ?Last data filed at 04/29/2021 2040 ?Gross per 24 hour  ?Intake 2100 ml  ?Output --  ?Net 2100 ml  ? ?Filed Weights  ? 04/29/21 1644 04/30/21 0041   ?Weight: 49.9 kg 54.6 kg  ? ? ?Examination: ?General: Frail elderly gentleman on vent ?HENT: [place]/AT, PERRL, no JVD ?Lungs: upper airway "rattle". No distress on minimal vent settings.  ?Cardiovascular: RRR, no MRG ?Abdomen: Soft, non-tender, non-distended ?Extremities: No acute deformity or ROM limitation ?Neuro: Somnolent but easily arouses. Nods appropriately.  ?GU: Foley ? ?Resolved Hospital Problem list   ? ? ?Assessment & Plan:  ? ?Chronic hypoxic respiratory failure: secondary to advanced COPD. During his most recent admission he was noted to have suboptimal positioning of current XLT. Other trach types were attempted with resultant poor ventilation. The decision was made to keep XLT. THe patient has significant anatomic distortion from longstanding COPD, saber sheath trachea. He does have adventitious lung sounds. Seem to be upper airway sounds presumably from suboptimally positioned trach, which has been unable to remedy in the past.  ?- Full vent support, continue chronic settings.  ?- Venous blood gas reviewed. Patient seems stable on home settings.  ?- Seems unlikely he will be a candidate for decannulation secondary to pulmonary cachexia and failure to thrive.  ?- Trach care per protocol ? ?Seizure: Staring spells/Gaze deviation.  ?- per primary, neurology. MRI pending.  ? ?UTI: ?- ABX per primary ? ?Atrial fibrillation: on DOAC ?Chronic HFrEF ? - per primary ? ?Best Practice (right click and "Reselect all SmartList Selections" daily)  ? ?Diet/type: tubefeeds ?DVT prophylaxis: DOAC ?GI prophylaxis: PPI ?Lines: N/A ?Foley:  Yes, and it is still needed chronic foley ?Code Status:  DNR ?Last date of multidisciplinary  goals of care discussion [  ] ? ?Labs   ?CBC: ?Recent Labs  ?Lab 04/28/21 ?5170 04/29/21 ?1729 04/29/21 ?1806 04/30/21 ?0107  ?WBC 15.7* 11.1*  --  8.3  ?NEUTROABS 13.9* 9.9*  --   --   ?HGB 11.7* 9.9* 16.3 9.5*  ?HCT 36.3* 32.6* 48.0 30.1*  ?MCV 89.9 92.1  --  89.6  ?PLT 354 325  --  228   ? ? ?Basic Metabolic Panel: ?Recent Labs  ?Lab 04/28/21 ?0174 04/29/21 ?1729 04/29/21 ?1806 04/30/21 ?0030 04/30/21 ?0107  ?NA 136 137 139  --  137  ?K 4.3 4.4 4.4  --  3.7  ?CL 96* 99  --   --  104  ?CO2 29 30  --   --  25  ?GLUCOSE 122* 173*  --   --  80  ?BUN 22 24*  --   --  22  ?CREATININE 0.53* 0.53*  --  0.50* 0.47*  ?CALCIUM 9.5 9.2  --   --  9.1  ?MG  --   --   --  2.0  --   ? ?GFR: ?Estimated Creatinine Clearance: 56.9 mL/min (A) (by C-G formula based on SCr of 0.47 mg/dL (L)). ?Recent Labs  ?Lab 04/28/21 ?2058 04/28/21 ?2314 04/29/21 ?1729 04/29/21 ?1735 04/29/21 ?1907 04/30/21 ?0107  ?WBC 15.7*  --  11.1*  --   --  8.3  ?LATICACIDVEN 2.5* 1.4  --  1.6 1.7  --   ? ? ?Liver Function Tests: ?Recent Labs  ?Lab 04/28/21 ?2058 04/30/21 ?0107  ?AST 26 21  ?ALT 33 25  ?ALKPHOS 134* 103  ?BILITOT 0.4 0.5  ?PROT 7.3 5.9*  ?ALBUMIN 3.1* 2.7*  ? ?No results for input(s): LIPASE, AMYLASE in the last 168 hours. ?Recent Labs  ?Lab 04/28/21 ?2058  ?AMMONIA 24  ? ? ?ABG ?   ?Component Value Date/Time  ? PHART 7.393 04/12/2021 0747  ? PCO2ART 52.7 (H) 04/12/2021 0747  ? PO2ART 51 (L) 04/12/2021 0747  ? HCO3 33.0 (H) 04/29/2021 1806  ? TCO2 35 (H) 04/29/2021 1806  ? O2SAT 99 04/29/2021 1806  ?  ? ?Coagulation Profile: ?No results for input(s): INR, PROTIME in the last 168 hours. ? ?Cardiac Enzymes: ?No results for input(s): CKTOTAL, CKMB, CKMBINDEX, TROPONINI in the last 168 hours. ? ?HbA1C: ?Hgb A1c MFr Bld  ?Date/Time Value Ref Range Status  ?04/11/2021 09:12 PM 5.5 4.8 - 5.6 % Final  ?  Comment:  ?  (NOTE) ?Pre diabetes:          5.7%-6.4% ? ?Diabetes:              >6.4% ? ?Glycemic control for   <7.0% ?adults with diabetes ?  ?10/02/2020 04:46 AM 6.1 (H) 4.8 - 5.6 % Final  ?  Comment:  ?  (NOTE) ?Pre diabetes:          5.7%-6.4% ? ?Diabetes:              >6.4% ? ?Glycemic control for   <7.0% ?adults with diabetes ?  ? ? ?CBG: ?Recent Labs  ?Lab 04/30/21 ?0102  ?GLUCAP 76  ? ? ?Review of Systems:   ?Patient is  encephalopathic and/or intubated. Therefore history has been obtained from chart review.  ?Review of Systems:   ?Bolds are positive  ?Constitutional: weight loss, gain, night sweats, Fevers, chills, fatigue .  ?HEENT: headaches, Sore throat, sneezing, nasal congestion, post nasal drip, Difficulty swallowing, Tooth/dental problems, visual complaints visual changes, ear ache ?CV:  chest pain, radiates:,Orthopnea,  PND, swelling in lower extremities, dizziness, palpitations, syncope.  ?GI  heartburn, indigestion, abdominal pain, nausea, vomiting, diarrhea, change in bowel habits, loss of appetite, bloody stools.  ?Resp: cough, productive: , hemoptysis, dyspnea, chest pain, pleuritic.  ?Skin: rash or itching or icterus ?GU: dysuria, change in color of urine, urgency or frequency. flank pain, hematuria  ?MS: joint pain or swelling. decreased range of motion  ?Psych: change in mood or affect. depression or anxiety.  ?Neuro: difficulty with speech, weakness, numbness, ataxia  ? ? ? ?Past Medical History:  ?He,  has a past medical history of CHF (congestive heart failure) ([place]), Coronary artery disease, Essential tremor, GI bleed (2019), Headache, Low blood sugar, Mitral regurgitation, Mitral valve prolapse, Osteopenia (2021), and Paroxysmal atrial fibrillation ([place]).  ? ?Surgical History:  ? ?Past Surgical History:  ?Procedure Laterality Date  ? COLONOSCOPY WITH PROPOFOL N/A 02/19/2017  ? Procedure: COLONOSCOPY WITH PROPOFOL;  Surgeon: Wilford Corner, MD;  Location: [facility];  Service: Endoscopy;  Laterality: N/A;  ? IR GASTROSTOMY TUBE MOD SED  10/18/2020  ? IR IVC FILTER PLMT / S&I /IMG GUID/MOD SED  11/01/2020  ? laser eye surgery for retina detachment    ? MITRAL VALVE REPAIR  01/2003  ? monitor  02/05/2006  ? polyp removal    ? TONSILLECTOMY    ? tooth removal    ? as a teenager  ? TRACHEOSTOMY TUBE PLACEMENT N/A 10/26/2020  ? Procedure: TRACHEOSTOMY;  Surgeon: Rozetta Nunnery, MD;  Location: [place];  Service:  ENT;  Laterality: N/A;  ?  ? ?Social History:  ? reports that he has never smoked. He has never used smokeless tobacco. He reports current alcohol use. He reports that he does not use drugs.  ? ?Family History:  ?His

## 2021-04-30 NOTE — Progress Notes (Signed)
EEG maintenance performed.  No skin breakdown observed at electrode sites Fp1, Fp2. 

## 2021-04-30 NOTE — Progress Notes (Signed)
PT Cancellation Note ? ?Patient Details ?Name: Austin Davis ?MRN: [id number] ?DOB: 03-Jul-1940 ? ? ?Cancelled Treatment:    Reason Eval/Treat Not Completed: PT screened, no needs identified, will sign off. Pt is LTC pt at [place]. Will sign off.  ? ? ?[place] ?04/30/2021, 11:27 AM ?Suanne Marker PT ?Acute Rehabilitation Services ?Office [phone] ? ?

## 2021-04-30 NOTE — Progress Notes (Signed)
RT assisted with transport of this pt from 3M12 to MRI and back while on full support. Pt tolerated well with SVS. RN currently at bedside. RT will continue to monitor. ?

## 2021-04-30 NOTE — Procedures (Addendum)
Patient Name: Austin Davis  ?MRN: [id number]  ?Epilepsy Attending: Lora Havens  ?Referring Physician/Provider: Greta Doom, MD ?Duration: 04/30/2021 0500 to 05/01/2021 0500 ? ?Patient history: 81 year old male with three episodes of gaze deviation and decreased responsiveness.  EEG to evaluate for seizure. ? ?Level of alertness: Awake, asleep ? ?AEDs during EEG study: LEV, Clonazepam ? ?Technical aspects: This EEG study was done with scalp electrodes positioned according to the 10-20 International system of electrode placement. Electrical activity was acquired at a sampling rate of 500Hz  and reviewed with a high frequency filter of 70Hz  and a low frequency filter of 1Hz . EEG data were recorded continuously and digitally stored.  ? ?Description: The posterior dominant rhythm consists of 8-9 Hz activity of moderate voltage (25-35 uV) seen predominantly in posterior head regions, symmetric and reactive to eye opening and eye closing. Sleep was characterized by vertex waves, sleep spindles (12 to 14 Hz), maximal frontocentral region. Hyperventilation and photic stimulation were not performed.    ? ?Patient was noted to have multiple episodes of mild tremor.  Concomitant EEG before, during and after the event did not show any EEG change to suggest seizure. ? ?IMPRESSION: ?This study is within normal limits. No seizures or epileptiform discharges were seen throughout the recording. ? ?Multiple episodes of mild tremor were recorded without concomitant EEG change.  These episodes were nonepileptic. ? ?Lora Havens  ? ?

## 2021-05-01 DIAGNOSIS — Z8249 Family history of ischemic heart disease and other diseases of the circulatory system: Secondary | ICD-10-CM | POA: Diagnosis not present

## 2021-05-01 DIAGNOSIS — I959 Hypotension, unspecified: Secondary | ICD-10-CM | POA: Diagnosis not present

## 2021-05-01 DIAGNOSIS — G9341 Metabolic encephalopathy: Secondary | ICD-10-CM | POA: Diagnosis present

## 2021-05-01 DIAGNOSIS — E441 Mild protein-calorie malnutrition: Secondary | ICD-10-CM | POA: Diagnosis not present

## 2021-05-01 DIAGNOSIS — I5042 Chronic combined systolic (congestive) and diastolic (congestive) heart failure: Secondary | ICD-10-CM | POA: Diagnosis present

## 2021-05-01 DIAGNOSIS — J9622 Acute and chronic respiratory failure with hypercapnia: Secondary | ICD-10-CM | POA: Diagnosis present

## 2021-05-01 DIAGNOSIS — F419 Anxiety disorder, unspecified: Secondary | ICD-10-CM | POA: Diagnosis not present

## 2021-05-01 DIAGNOSIS — J9621 Acute and chronic respiratory failure with hypoxia: Secondary | ICD-10-CM | POA: Diagnosis present

## 2021-05-01 DIAGNOSIS — G4089 Other seizures: Secondary | ICD-10-CM | POA: Diagnosis not present

## 2021-05-01 DIAGNOSIS — G894 Chronic pain syndrome: Secondary | ICD-10-CM | POA: Diagnosis not present

## 2021-05-01 DIAGNOSIS — E559 Vitamin D deficiency, unspecified: Secondary | ICD-10-CM | POA: Diagnosis not present

## 2021-05-01 DIAGNOSIS — I482 Chronic atrial fibrillation, unspecified: Secondary | ICD-10-CM | POA: Diagnosis not present

## 2021-05-01 DIAGNOSIS — R569 Unspecified convulsions: Secondary | ICD-10-CM | POA: Diagnosis not present

## 2021-05-01 DIAGNOSIS — F32A Depression, unspecified: Secondary | ICD-10-CM | POA: Diagnosis not present

## 2021-05-01 DIAGNOSIS — I5023 Acute on chronic systolic (congestive) heart failure: Secondary | ICD-10-CM | POA: Diagnosis not present

## 2021-05-01 DIAGNOSIS — G40909 Epilepsy, unspecified, not intractable, without status epilepticus: Secondary | ICD-10-CM | POA: Diagnosis present

## 2021-05-01 DIAGNOSIS — J9611 Chronic respiratory failure with hypoxia: Secondary | ICD-10-CM | POA: Diagnosis not present

## 2021-05-01 DIAGNOSIS — Z9911 Dependence on respirator [ventilator] status: Secondary | ICD-10-CM | POA: Diagnosis not present

## 2021-05-01 DIAGNOSIS — Z431 Encounter for attention to gastrostomy: Secondary | ICD-10-CM | POA: Diagnosis not present

## 2021-05-01 DIAGNOSIS — E871 Hypo-osmolality and hyponatremia: Secondary | ICD-10-CM | POA: Diagnosis present

## 2021-05-01 DIAGNOSIS — J449 Chronic obstructive pulmonary disease, unspecified: Secondary | ICD-10-CM | POA: Diagnosis present

## 2021-05-01 DIAGNOSIS — J9503 Malfunction of tracheostomy stoma: Secondary | ICD-10-CM | POA: Diagnosis present

## 2021-05-01 DIAGNOSIS — R1310 Dysphagia, unspecified: Secondary | ICD-10-CM | POA: Diagnosis not present

## 2021-05-01 DIAGNOSIS — B952 Enterococcus as the cause of diseases classified elsewhere: Secondary | ICD-10-CM | POA: Diagnosis present

## 2021-05-01 DIAGNOSIS — C3432 Malignant neoplasm of lower lobe, left bronchus or lung: Secondary | ICD-10-CM | POA: Diagnosis present

## 2021-05-01 DIAGNOSIS — G4733 Obstructive sleep apnea (adult) (pediatric): Secondary | ICD-10-CM | POA: Diagnosis not present

## 2021-05-01 DIAGNOSIS — Z1621 Resistance to vancomycin: Secondary | ICD-10-CM | POA: Diagnosis present

## 2021-05-01 DIAGNOSIS — J9620 Acute and chronic respiratory failure, unspecified whether with hypoxia or hypercapnia: Secondary | ICD-10-CM | POA: Diagnosis not present

## 2021-05-01 DIAGNOSIS — Z430 Encounter for attention to tracheostomy: Secondary | ICD-10-CM | POA: Diagnosis not present

## 2021-05-01 DIAGNOSIS — Z20822 Contact with and (suspected) exposure to covid-19: Secondary | ICD-10-CM | POA: Diagnosis not present

## 2021-05-01 DIAGNOSIS — L89153 Pressure ulcer of sacral region, stage 3: Secondary | ICD-10-CM | POA: Diagnosis not present

## 2021-05-01 DIAGNOSIS — E8729 Other acidosis: Secondary | ICD-10-CM | POA: Diagnosis present

## 2021-05-01 DIAGNOSIS — D638 Anemia in other chronic diseases classified elsewhere: Secondary | ICD-10-CM | POA: Diagnosis present

## 2021-05-01 DIAGNOSIS — R64 Cachexia: Secondary | ICD-10-CM | POA: Diagnosis present

## 2021-05-01 DIAGNOSIS — Z4659 Encounter for fitting and adjustment of other gastrointestinal appliance and device: Secondary | ICD-10-CM | POA: Diagnosis not present

## 2021-05-01 DIAGNOSIS — R0602 Shortness of breath: Secondary | ICD-10-CM | POA: Diagnosis present

## 2021-05-01 DIAGNOSIS — Z825 Family history of asthma and other chronic lower respiratory diseases: Secondary | ICD-10-CM | POA: Diagnosis not present

## 2021-05-01 DIAGNOSIS — E785 Hyperlipidemia, unspecified: Secondary | ICD-10-CM | POA: Diagnosis present

## 2021-05-01 DIAGNOSIS — Z66 Do not resuscitate: Secondary | ICD-10-CM | POA: Diagnosis not present

## 2021-05-01 DIAGNOSIS — J690 Pneumonitis due to inhalation of food and vomit: Secondary | ICD-10-CM | POA: Diagnosis present

## 2021-05-01 DIAGNOSIS — I480 Paroxysmal atrial fibrillation: Secondary | ICD-10-CM | POA: Diagnosis not present

## 2021-05-01 DIAGNOSIS — N390 Urinary tract infection, site not specified: Secondary | ICD-10-CM | POA: Diagnosis present

## 2021-05-01 DIAGNOSIS — E43 Unspecified severe protein-calorie malnutrition: Secondary | ICD-10-CM | POA: Diagnosis present

## 2021-05-01 LAB — BRAIN NATRIURETIC PEPTIDE: B Natriuretic Peptide: 193 pg/mL — ABNORMAL HIGH (ref 0.0–100.0)

## 2021-05-01 LAB — PROCALCITONIN: Procalcitonin: 0.14 ng/mL

## 2021-05-01 LAB — CBC WITH DIFFERENTIAL/PLATELET
Abs Immature Granulocytes: 0.03 10*3/uL (ref 0.00–0.07)
Basophils Absolute: 0 10*3/uL (ref 0.0–0.1)
Basophils Relative: 0 %
Eosinophils Absolute: 0.3 10*3/uL (ref 0.0–0.5)
Eosinophils Relative: 5 %
HCT: 30.5 % — ABNORMAL LOW (ref 39.0–52.0)
Hemoglobin: 9.7 g/dL — ABNORMAL LOW (ref 13.0–17.0)
Immature Granulocytes: 0 %
Lymphocytes Relative: 10 %
Lymphs Abs: 0.7 10*3/uL (ref 0.7–4.0)
MCH: 28.5 pg (ref 26.0–34.0)
MCHC: 31.8 g/dL (ref 30.0–36.0)
MCV: 89.7 fL (ref 80.0–100.0)
Monocytes Absolute: 0.7 10*3/uL (ref 0.1–1.0)
Monocytes Relative: 10 %
Neutro Abs: 5.3 10*3/uL (ref 1.7–7.7)
Neutrophils Relative %: 75 %
Platelets: 283 10*3/uL (ref 150–400)
RBC: 3.4 MIL/uL — ABNORMAL LOW (ref 4.22–5.81)
RDW: 16.3 % — ABNORMAL HIGH (ref 11.5–15.5)
WBC: 7.1 10*3/uL (ref 4.0–10.5)
nRBC: 0 % (ref 0.0–0.2)

## 2021-05-01 LAB — COMPREHENSIVE METABOLIC PANEL
ALT: 24 U/L (ref 0–44)
AST: 19 U/L (ref 15–41)
Albumin: 2.7 g/dL — ABNORMAL LOW (ref 3.5–5.0)
Alkaline Phosphatase: 102 U/L (ref 38–126)
Anion gap: 6 (ref 5–15)
BUN: 16 mg/dL (ref 8–23)
CO2: 31 mmol/L (ref 22–32)
Calcium: 8.9 mg/dL (ref 8.9–10.3)
Chloride: 104 mmol/L (ref 98–111)
Creatinine, Ser: 0.46 mg/dL — ABNORMAL LOW (ref 0.61–1.24)
GFR, Estimated: >60 mL/min (ref >60)
Glucose, Bld: 112 mg/dL — ABNORMAL HIGH (ref 70–99)
Potassium: 3.9 mmol/L (ref 3.5–5.1)
Sodium: 141 mmol/L (ref 135–145)
Total Bilirubin: 0.2 mg/dL — ABNORMAL LOW (ref 0.3–1.2)
Total Protein: 6 g/dL — ABNORMAL LOW (ref 6.5–8.1)

## 2021-05-01 LAB — RESP PANEL BY RT-PCR (FLU A&B, COVID) ARPGX2
Influenza A by PCR: NEGATIVE
Influenza B by PCR: NEGATIVE
SARS Coronavirus 2 by RT PCR: NEGATIVE

## 2021-05-01 LAB — GLUCOSE, CAPILLARY
Glucose-Capillary: 164 mg/dL — ABNORMAL HIGH (ref 70–99)
Glucose-Capillary: 100 mg/dL — ABNORMAL HIGH (ref 70–99)
Glucose-Capillary: 104 mg/dL — ABNORMAL HIGH (ref 70–99)

## 2021-05-01 LAB — PHOSPHORUS: Phosphorus: 3.1 mg/dL (ref 2.5–4.6)

## 2021-05-01 LAB — MAGNESIUM: Magnesium: 1.9 mg/dL (ref 1.7–2.4)

## 2021-05-01 MED ORDER — PROPRANOLOL HCL 20 MG PO TABS
10.0000 mg | ORAL_TABLET | Freq: Every day | ORAL | Status: DC | PRN
  Administered 2021-05-01: 10 mg
  Filled 2021-05-01: qty 1

## 2021-05-01 MED ORDER — PROPRANOLOL HCL 20 MG PO TABS: 10.0000 mg | ORAL_TABLET | Freq: Every day | ORAL | Status: DC | PRN

## 2021-05-01 MED ORDER — PROPRANOLOL HCL 10 MG PO TABS: 10.0000 mg | ORAL_TABLET | Freq: Every day | ORAL | Status: DC | PRN

## 2021-05-01 MED ORDER — MIDODRINE HCL 10 MG PO TABS: 10.0000 mg | ORAL_TABLET | Freq: Three times a day (TID) | ORAL | Status: DC

## 2021-05-01 MED ORDER — ALPRAZOLAM 0.5 MG PO TABS: 0.5000 mg | ORAL_TABLET | Freq: Three times a day (TID) | ORAL | 0 refills | Status: DC | PRN

## 2021-05-01 MED ORDER — CLONAZEPAM 0.5 MG PO TABS: 0.5000 mg | ORAL_TABLET | Freq: Two times a day (BID) | ORAL | 0 refills | Status: DC

## 2021-05-01 MED ORDER — IPRATROPIUM-ALBUTEROL 0.5-2.5 (3) MG/3ML IN SOLN: 3.0000 mL | Freq: Four times a day (QID) | RESPIRATORY_TRACT | Status: DC | PRN

## 2021-05-01 NOTE — Discharge Instructions (Addendum)
Follow with Primary MD Sheppard Coil, Vicie Mutters, MD in 7 days  ? ?Get CBC, CMP, 2 view Chest X ray -  checked next visit within 1 week by LTAC MD  ? ?Activity: As tolerated with Full fall precautions use walker/cane & assistance as needed ? ?Disposition LTAC ? ?Diet: NPO.  All diet and medications via PEG tube. ? ?Special Instructions: If you have smoked or chewed Tobacco  in the last 2 yrs please stop smoking, stop any regular Alcohol  and or any Recreational drug use. ? ?On your next visit with your primary care physician please Get Medicines reviewed and adjusted. ? ?Please request your Prim.MD to go over all Hospital Tests and Procedure/Radiological results at the follow up, please get all Hospital records sent to your Prim MD by signing hospital release before you go home. ? ?If you experience worsening of your admission symptoms, develop shortness of breath, life threatening emergency, suicidal or homicidal thoughts you must seek medical attention immediately by calling 911 or calling your MD immediately  if symptoms less severe. ? ?You Must read complete instructions/literature along with all the possible adverse reactions/side effects for all the Medicines you take and that have been prescribed to you. Take any new Medicines after you have completely understood and accpet all the possible adverse reactions/side effects.  ? ?  ?

## 2021-05-01 NOTE — TOC Transition Note (Signed)
Transition of Care (TOC) - CM/SW Discharge Note ? ? ?Patient Details  ?Name: Austin Davis ?MRN: [id number] ?Date of Birth: 1940/06/06 ? ?Transition of Care (TOC) CM/SW Contact:  ?Paulene Floor Phinehas Grounds, LCSWA ?Phone Number: ?05/01/2021, 12:26 PM ? ? ?Clinical Narrative:    ?Patient will DC to:  [facility] ?Anticipated DC date: 05/01/2021 ?Family notified:  Yes ?Transport by: [facility] ? ? ?Per MD patient ready for DC to SNF. RN to call report prior to discharge [phone] room 303B and receiving MD will be Abelardo Diesel . RN, patient's family, and facility notified of DC. Discharge Summary sent to facility. DC packet on chart. [facility] transport will be requested for patient by RN as patient is vent dependent.   ? ?CSW will sign off for now as social work intervention is no longer needed. Please consult [place] again if new needs arise. ?  ? ? ?Final next level of care: [place] ?Barriers to Discharge: Barriers Resolved ? ? ?Patient Goals and CMS Choice ?  ?  ?  ? ?Discharge Placement ?  ?           ?Patient chooses bed at:  ([facility]) ?  ?Name of family member notified: Jazon, Jipson (Daughter)   [phone] ?Patient and family notified of of transfer: 05/01/21 ? ?Discharge Plan and Services ?  ?  ?           ?  ?  ?  ?  ?  ?  ?  ?  ?  ?  ? ?Social Determinants of Health (SDOH) Interventions ?  ? ? ?Readmission Risk Interventions ?   ? View : No data to display.  ?  ?  ?  ? ? ? ? ? ?

## 2021-05-01 NOTE — Procedures (Addendum)
Patient Name: Austin Davis  ?MRN: [id number]  ?Epilepsy Attending: Lora Havens  ?Referring Physician/Provider: Greta Doom, MD ?Duration: 05/01/2021 0500 to 05/01/2021 1034 ?  ?Patient history: 81 year old male with three episodes of gaze deviation and decreased responsiveness.  EEG to evaluate for seizure. ?  ?Level of alertness: Awake, asleep ?  ?AEDs during EEG study: LEV, Clonazepam ?  ?Technical aspects: This EEG study was done with scalp electrodes positioned according to the 10-20 International system of electrode placement. Electrical activity was acquired at a sampling rate of 500Hz  and reviewed with a high frequency filter of 70Hz  and a low frequency filter of 1Hz . EEG data were recorded continuously and digitally stored.  ?  ?Description: The posterior dominant rhythm consists of 8-9 Hz activity of moderate voltage (25-35 uV) seen predominantly in posterior head regions, symmetric and reactive to eye opening and eye closing. Sleep was characterized by vertex waves, sleep spindles (12 to 14 Hz), maximal frontocentral region. Hyperventilation and photic stimulation were not performed.    ?  ?IMPRESSION: ?This study is within normal limits. No seizures or epileptiform discharges were seen throughout the recording. ? ?Lora Havens  ?

## 2021-05-01 NOTE — Progress Notes (Signed)
? ?NAME:  Austin Davis, MRN:  [id number], DOB:  02-05-1940, LOS: 2 ?ADMISSION DATE:  04/29/2021, CONSULTATION DATE:  4/11 ?REFERRING MD:  Dr. Marcello Moores [facility]), CHIEF COMPLAINT:  Chronic trach/vent  ? 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 Recently admitted with bacteremia/pneumonia complicated by pneumothorax.Presented to [facility] ED 4/10 with seizure. He was initially loaded with Keppra in the ED and was discharged back to [facility] with plans for scheduled Keppra. Also started on keflex for UTI. He did unfortunately have recurrent sz upon arrival back to [facility] and was transported again to the ED. He was admitted to the hospitalist service with neurology consultation. PCCM was asked to see for ventilator management.   ? ?Pertinent  Medical History  ? has a past medical history of CHF (congestive heart failure) ([place]), Coronary artery disease, Essential tremor, GI bleed (2019), Headache, Low blood sugar, Mitral regurgitation, Mitral valve prolapse, Osteopenia (2021), and Paroxysmal atrial fibrillation ([place]). ? ?Significant Hospital Events: ?Including procedures, antibiotic start and stop dates in addition to other pertinent events   ?4/10 presented to ED with sz. Started on Keppra and dc from ED. Then back with additional sz.  ? ?Interim History / Subjective:  ?No acute events ? ?Remains afebrile ? ?2 L this admission, 1.4 L urine output, 1 unmeasured stool ? ?Unable to obtain subjective evaluation due to patient status ? ?Objective   ?Blood pressure (!) 105/53, pulse 64, temperature 98.1 ?F (36.7 ?C), temperature source Oral, resp. rate (!) 21, height 5\' 7"  (1.702 m), weight 57.1 kg, SpO2 97 %. ?   ?Vent Mode: PCV ?FiO2 (%):  [35 %] 35 % ?Set Rate:  [16 bmp] 16 bmp ?PEEP:  [5 cmH20] 5 cmH20 ?Plateau Pressure:  [12 cmH20-18 cmH20] 18 cmH20  ? ?Intake/Output Summary (Last 24  hours) at 05/01/2021 0951 ?Last data filed at 05/01/2021 0600 ?Gross per 24 hour  ?Intake 1814.55 ml  ?Output 1400 ml  ?Net 414.55 ml  ? ? ?Filed Weights  ? 04/30/21 0041 04/30/21 0445 05/01/21 0403  ?Weight: 54.6 kg 54.6 kg 57.1 kg  ? ? ?Examination: ?General: In bed, NAD, appears comfortable, frail, cachectic ?HEENT: MM pink/moist, anicteric, atraumatic ?Neuro: RASS 0, PERRL 60mm, GCS 11t ?CV: S1S2, Afib, no m/r/g appreciated ?PULM: Air movement in all lobes lobes, trachea midline, chest expansion symmetric, trach clean dry intact ?GI: soft, bsx4 active, non-tender   ?Extremities: warm/dry, no pretibial edema, capillary refill less than 3 seconds  ?Skin:  no rashes or lesions noted ? ? ?CBC reviewed ?CMP reviewed ?Procalcitonin 0.31> 0.14 ? ?Resolved Hospital Problem list   ? ? ?Assessment & Plan:  ? ?Chronic hypoxic respiratory failure: secondary to advanced COPD. During his most recent admission he was noted to have suboptimal positioning of current XLT. Other trach types were attempted with resultant poor ventilation. The decision was made to keep XLT. The patient has significant anatomic distortion from longstanding COPD, saber sheath trachea. Procalcitonin 0.31> 0.14.  Afebrile.  No Leukocytosis. ?-Full vent support, continue chronic home settings ?-Not a candidate for decannulation secondary to pulmonary cachexia and failure to thrive ?-Trach care per protocol ?-Continue Xopenex ?-Continue guaifenesin ? ?Seizure: Staring spells/Gaze deviation.  ?-Per primary.  Neurology following ?-On Keppra ? ?UTI: ?-Antibiotics per primary ? ?Atrial fibrillation: on DOAC ?Chronic HFrEF ? - Per primary ? ?PCCM will follow weekly.  Please let the team know if  the patient needs to be seen sooner. ? ?Best Practice (right click and "Reselect all SmartList Selections" daily)  ? ?Per primary ? ? ?Critical care time:  n/a ?  ? ?Redmond School., MSN, APRN, AGACNP-BC ?[facility]  ?05/01/2021 , 10:01  AM ? ?Please see [email or web address] for pager details ? ?If no response, please call [phone] ?After hours, please call Elink at [phone] ? ? ? ? ? ? ? ? ?

## 2021-05-01 NOTE — Progress Notes (Addendum)
Neurology Progress Note ? ? ?S:// ?Patient is lying in bed on ventilator, awake and alert in NAD. He is connected to LTM, no spells overnight per RN. No family at bedside. Patient is oriented , able to mouth words or use communication board. No seizures seen on cEEG. Will d/c EEG. Dr. Lorrin Goodell spoke with patient states his baseline tremors are worse now and happens when he gets upset or anxious. Will add propanolol in am PRN for tremors.  ? ? ?O:// ?Current vital signs: ?BP (!) 105/53   Pulse 64   Temp 98.1 ?F (36.7 ?C) (Oral)   Resp (!) 21   Ht 5' 7"  (1.702 m)   Wt 57.1 kg   SpO2 97%   BMI 19.72 kg/m?  ?Vital signs in last 24 hours: ?Temp:  [97.8 ?F (36.6 ?C)-98.5 ?F (36.9 ?C)] 98.1 ?F (36.7 ?C) (04/12 1610) ?Pulse Rate:  [53-85] 64 (04/12 0900) ?Resp:  [15-23] 21 (04/12 0900) ?BP: (92-147)/(53-80) 105/53 (04/12 0900) ?SpO2:  [93 %-100 %] 97 % (04/12 0900) ?FiO2 (%):  [35 %] 35 % (04/12 0828) ?Weight:  [57.1 kg] 57.1 kg (04/12 0403) ? ?GENERAL: Awake, alert in NAD on ventilator ?HEENT: - Normocephalic and atraumatic, dry mm, trach in place ?LUNGS - Clear to auscultation bilaterally with no wheezes ?CV - S1S2 RRR, no m/r/g, equal pulses bilaterally. ?ABDOMEN - Soft, nontender, nondistended with normoactive BS ?Ext: warm, well perfused, intact peripheral pulses, no edema ? ?NEURO:  ?Mental Status: AA&Ox3. Mouth some words, unable to understand all what he tries to say, uses communication board  ?Speech: Unable to assess ?Visual: full ?Pupils: equal and reactive ?EOM: intact ?Corneal Reflex: not tested  ?Face: symmetric  ?Cough/Gag: present  ?Motor: Moves all 4 extremities ?Tone: is normal and bulk is normal ?Sensation- Intact to light touch bilaterally ?Coordination: FTN intact bilaterally ?Gait- deferred ? ?Medications ? ?Current Facility-Administered Medications:  ?  acetaminophen (TYLENOL) tablet 650 mg, 650 mg, Per Tube, Q6H PRN, Clance Boll, MD, 650 mg at 04/30/21 1848 ?  ascorbic acid (VITAMIN  C) tablet 500 mg, 500 mg, Per Tube, Daily, Myles Rosenthal A, MD, 500 mg at 05/01/21 0801 ?  cephALEXin (KEFLEX) 250 MG/5ML suspension 500 mg, 500 mg, Per Tube, TID, Myles Rosenthal A, MD, 500 mg at 05/01/21 0805 ?  chlorhexidine gluconate (MEDLINE KIT) (PERIDEX) 0.12 % solution 15 mL, 15 mL, Mouth Rinse, BID, Myles Rosenthal A, MD, 15 mL at 05/01/21 0803 ?  Chlorhexidine Gluconate Cloth 2 % PADS 6 each, 6 each, Topical, Q0600, Clance Boll, MD, 6 each at 05/01/21 0804 ?  cholecalciferol (VITAMIN D3) tablet 2,000 Units, 2,000 Units, Per Tube, Daily, Clance Boll, MD, 2,000 Units at 05/01/21 0806 ?  clonazePAM (KLONOPIN) tablet 0.5 mg, 0.5 mg, Per Tube, BID, Myles Rosenthal A, MD, 0.5 mg at 05/01/21 0755 ?  docusate (COLACE) 50 MG/5ML liquid 100 mg, 100 mg, Per Tube, BID PRN, Myles Rosenthal A, MD ?  enoxaparin (LOVENOX) injection 30 mg, 30 mg, Subcutaneous, Q24H, Myles Rosenthal A, MD, 30 mg at 05/01/21 0802 ?  escitalopram (LEXAPRO) tablet 10 mg, 10 mg, Per Tube, Daily, Clance Boll, MD, 10 mg at 05/01/21 0801 ?  feeding supplement (JEVITY 1.5 CAL/FIBER) liquid 1,000 mL, 1,000 mL, Per Tube, Continuous, Thurnell Lose, MD, Last Rate: 55 mL/hr at 04/30/21 1110, 1,000 mL at 04/30/21 1110 ?  feeding supplement (PROSource TF) liquid 45 mL, 45 mL, Per Tube, BID, Myles Rosenthal A, MD, 45 mL at 05/01/21 0802 ?  folic acid (FOLVITE) tablet 1 mg, 1 mg, Per Tube, Daily, Myles Rosenthal A, MD, 1 mg at 05/01/21 0801 ?  guaiFENesin (ROBITUSSIN) 100 MG/5ML liquid 20 mL, 20 mL, Per Tube, TID, Myles Rosenthal A, MD, 20 mL at 05/01/21 0800 ?  hydrOXYzine (ATARAX) tablet 25 mg, 25 mg, Per Tube, Q6H PRN, Clance Boll, MD, 25 mg at 05/01/21 1049 ?  ipratropium-albuterol (DUONEB) 0.5-2.5 (3) MG/3ML nebulizer solution 3 mL, 3 mL, Nebulization, Q6H PRN, Clance Boll, MD ?  lactated ringers infusion, , Intravenous, Continuous, Thurnell Lose, MD, Last Rate: 75 mL/hr at 05/01/21  0600, Infusion Verify at 05/01/21 0600 ?  lactulose (CHRONULAC) 10 GM/15ML solution 20 g, 20 g, Per Tube, BID PRN, Clance Boll, MD ?  levalbuterol Penne Lash) nebulizer solution 1.25 mg, 1.25 mg, Nebulization, Q6H, Myles Rosenthal A, MD, 1.25 mg at 05/01/21 5638 ?  levETIRAcetam (KEPPRA) 100 MG/ML solution 500 mg, 500 mg, Per Tube, BID, Myles Rosenthal A, MD, 500 mg at 05/01/21 0802 ?  levothyroxine (SYNTHROID) tablet 25 mcg, 25 mcg, Per Tube, QAC breakfast, Myles Rosenthal A, MD, 25 mcg at 05/01/21 0506 ?  loratadine (CLARITIN) tablet 10 mg, 10 mg, Per Tube, Daily, Myles Rosenthal A, MD, 10 mg at 04/30/21 0930 ?  MEDLINE mouth rinse, 15 mL, Mouth Rinse, 10 times per day, Clance Boll, MD, 15 mL at 05/01/21 7564 ?  midodrine (PROAMATINE) tablet 10 mg, 10 mg, Per Tube, TID WC, Thurnell Lose, MD, 10 mg at 05/01/21 0801 ?  multivitamin with minerals tablet 1 tablet, 1 tablet, Per Tube, Daily, Clance Boll, MD, 1 tablet at 05/01/21 0801 ?  omega-3 acid ethyl esters (LOVAZA) capsule 1 g, 1 capsule, Per Tube, Daily, Myles Rosenthal A, MD ?  ondansetron (ZOFRAN) tablet 4 mg, 4 mg, Oral, Q6H PRN **OR** ondansetron (ZOFRAN) injection 4 mg, 4 mg, Intravenous, Q6H PRN, Clance Boll, MD ?  pantoprazole sodium (PROTONIX) 40 mg/20 mL oral suspension 40 mg, 40 mg, Per Tube, Daily, Clance Boll, MD, 40 mg at 05/01/21 0801 ?  polyethylene glycol (MIRALAX / GLYCOLAX) packet 17 g, 17 g, Per Tube, Sandi Mariscal, MD, 17 g at 04/30/21 0930 ?  polyethylene glycol (MIRALAX / GLYCOLAX) packet 17 g, 17 g, Per Tube, Daily PRN, Clance Boll, MD ?  propranolol (INDERAL) tablet 10 mg, 10 mg, Per Tube, Daily PRN, Donnetta Simpers, MD, 10 mg at 05/01/21 1049 ?  saccharomyces boulardii (FLORASTOR) capsule 250 mg, 250 mg, Per Tube, Daily, Myles Rosenthal A, MD, 250 mg at 05/01/21 0804 ?  sodium chloride (OCEAN) 0.65 % nasal spray 1 spray, 1 spray, Each Nare, Q4H PRN, Myles Rosenthal A, MD, 1 spray at 05/01/21 0500 ?  sodium chloride flush (NS) 0.9 % injection 3 mL, 3 mL, Intravenous, Q12H, Myles Rosenthal A, MD, 3 mL at 05/01/21 0805 ? ? ?Imaging ?I have reviewed images in epic and the results pertinent to this consultation are: ? ?CT-scan of the brain 4/9: ?No acute process. Atrophy and chronic small vessel ischemic changes of the white ?Matter ? ?MRI Brain, MRA Head/neck 4/11: ?No acute infarction, hemorrhage, or mass. Mild chronic microvascular ischemic changes. ?  ?No proximal intracranial vessel occlusion or significant stenosis. 1 ?mm outpouching from the distal supraclinoid right ICA may reflect a ?small aneurysm or infundibulum at the origin of an unseen small ?vessel. ?  ?No occlusion or hemodynamically significant stenosis in the neck ? ?cEEG 4/12:  ?This study is within normal  limits. No seizures or epileptiform discharges were seen throughout the recording. ?  ?Assessment:  ? Austin Davis is a 81 y.o. male with a history of tremor, paroxysmal atrial fibrillation not on anticoagulation who presents with several episodes of decreased responsiveness with report of gaze deviation.  He had two episodes earlier and was loaded with Keppra in the emergency department last night and started on 500 mg twice daily. He had another episode of behavioral arrest and therefore was brought back to the emergency department. ? 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  I am unable to understand his mouthing words, and he does not want to attempt writing due to his tremor. ? ?Recommendations: ?- Propanolol 47m daily PRN for tremors. Hold for HR < 60 or SBP <90 ?-D/C cEEG ?- Continue Keppra 5093mBID  ?- May transfer back to [facility] from Neurology standpoint ?- Neurology will sign off. Please call with questions or concerns.  ? ?DeBeulah GandyNP ACNPC-AG ? ? ?NEUROHOSPITALIST  ADDENDUM ?Performed a face to face diagnostic evaluation.  ? ?I have reviewed the contents of history and physical exam as documented by PA/ARNP/Resident and agree with above documentation.  ?I have discussed and

## 2021-05-01 NOTE — Progress Notes (Signed)
Discontinued cEEG study.  Notified [facility] monitoring.  No skin breakdown observed. 

## 2021-05-01 NOTE — Progress Notes (Signed)
RT note- going back to [facility] with [facility] ?

## 2021-05-01 NOTE — Discharge Summary (Signed)
?                                                                                ? ?RYOT BURROUS LKG:[id number] DOB: February 21, 1940 DOA: 04/29/2021 ? ?PCP: Aaron Edelman, MD ? ?Admit date: 04/29/2021  Discharge date: 05/01/2021 ? ?Admitted From: LTAC   Disposition:  [facility] ? ? ?Recommendations for Outpatient Follow-up:  ? ?Follow up with PCP in 1-2 weeks ? ?PCP Please obtain BMP/CBC, 2 view CXR in 1week,  (see Discharge instructions)  ? ?PCP Please follow up on the following pending results:  ? ? ?Home Health: None   ?Equipment/Devices: None  ?Consultations: Neuro, PCCM ?Discharge Condition: Fair  ?CODE STATUS: Full    ?Diet Recommendation: NPO. All diet and Meds via PEG ? ?Chief Complaint  ?Patient presents with  ? [name]  ? Seizures  ?  ? ?Brief history of present illness from the day of admission and additional interim summary   ? ?81 y.o. 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 [facility] B[facility] early am of 4/10 due to staring spells at [facility], he was brought to [facility] ER he was seen by neurology team and admitted for further treatment of possible seizures. ? ?                                                               Hospital Course  ? ?New Onset SZ - patient was on Keppra at [place] but not clear when his seizures initially started, head CT MRI brain and MRA head and neck nonacute, EEG unrevealing, neurology team on board.  Case discussed with neurologist Dr. Alferd Patee, continue Keppra 500 twice daily, discharge back to [facility], will request LTAC to make sure he is getting his seizure medications on time. ?  ?Obstructive sleep apnea complicated last year with  aspiration pneumonia and ARDS requiring tracheostomy in November of last year., now chronically vent dependent.  Supportive care, pulmonary and critical care medicine managing ventilator.  Currently involve palliative care at [facility] for long-term goals of care. ?  ?Recent UTI - he is finishing his oral antibiotic course on which she came here on 05/09/2021. ?  ?Infiltrate cxr  - no fever , sat stable after suctioning, continue trach site care and ventilator care at [facility] ?   ?Chronic hypotension - hydrates, increase midodrine dose, stable now. ?  ?Essential tremor - continue primidone, prn B blocker if tolerated by his BP.  ?  ?Chronic systolic/combined diastolic  HF - echo 6/64: grade 1 diastolic ef 40-34%, blood pressure too low for beta-blocker, ACE/ARB, diuretics at [facility] as before.  Monitor. ?  ?Afib s/p MAZE - in sinus, not on anticoagulation chronically. ?  ?H/O DVT - currently not on therapeutic anticoagulation. ?  ?Failure to thrive  in adult - Protein Cal malnutrition, nutrition consult. ?  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 ? ?Lab Results  ?Component Value Date  ? TSH 1.244 04/30/2021  ? ? ? ?Discharge diagnosis   ? ? ?Principal Problem: ?  Seizure ([place]) ?Active Problems: ?  Pressure injury of skin ? ? ? ?Discharge instructions   ? ?Discharge Instructions   ? ? Diet - low sodium heart healthy   Complete by: As directed ?  ? Discharge instructions   Complete by: As directed ?  ? Follow with Primary MD Sheppard Coil, Vicie Mutters, MD in 7 days  ? ?Get CBC, CMP, 2 view Chest X ray -  checked next visit within 1 week by LTAC MD  ? ?Activity: As tolerated with Full fall precautions use walker/cane & assistance as needed ? ?Disposition LTAC ? ?Diet: NPO.  All medications and diet via PEG tube. ? ?Special Instructions: If you have smoked or chewed Tobacco  in the last 2 yrs please stop smoking, stop any regular Alcohol  and or any Recreational drug use. ? ?On your next visit with your primary care physician please Get  Medicines reviewed and adjusted. ? ?Please request your Prim.MD to go over all Hospital Tests and Procedure/Radiological results at the follow up, please get all Hospital records sent to your Prim MD by signing hospital release before you go home. ? ?If you experience worsening of your admission symptoms, develop shortness of breath, life threatening emergency, suicidal or homicidal thoughts you must seek medical attention immediately by calling 911 or calling your MD immediately  if symptoms less severe. ? ?You Must read complete instructions/literature along with all the possible adverse reactions/side effects for all the Medicines you take and that have been prescribed to you. Take any new Medicines after you have completely understood and accpet all the possible adverse reactions/side effects.  ? Discharge wound care:   Complete by: As directed ?  ? Sacrum Medial;Lower Stage 1 -  Intact skin with non-blanchable redness of a localized area usually over a bony prominence wound care per LTAC protocol.  ? Increase activity slowly   Complete by: As directed ?  ? ?  ? ? ?Discharge Medications  ? ?Allergies as of 05/01/2021   ? ?   Reactions  ? Prednisone Other (See Comments)  ? Makes skin crawl, rapid HR  ? Cortisone Palpitations  ? ?  ? ?  ?Medication List  ?  ? ?STOP taking these medications   ? ?doxazosin 2 MG tablet ?Commonly known as: CARDURA ?  ? ?  ? ?TAKE these medications   ? ?acetaminophen 650 MG CR tablet ?Commonly known as: TYLENOL ?Take 650 mg by mouth every 8 (eight) hours as needed for pain. Via tube ?  ?ALPRAZolam 0.5 MG tablet ?Commonly known as: Duanne Moron ?Place 1 tablet (0.5 mg total) into feeding tube every 8 (eight) hours as needed for anxiety. ?  ?cephALEXin 250 MG/5ML suspension ?Commonly known as: KEFLEX ?Take 10 mLs (500 mg total) by mouth 3 (three) times daily for 10 days. ?What changed:  ?when to take this ?additional instructions ?  ?clonazePAM 0.5 MG tablet ?Commonly known as: KLONOPIN ?Place 1  tablet (0.5 mg total) into feeding tube 2 (two) times daily. ?  ?Culturelle Caps ?Give 1 capsule by tube daily. ?  ?escitalopram 10 MG tablet ?Commonly known as: LEXAPRO ?Place 10 mg into feeding tube daily. ?  ?esomeprazole 40 MG packet ?Commonly known as: [place] ?Take 40 mg by mouth daily. Per G  tube ?  ?fexofenadine 180 MG tablet ?Commonly known as: ALLEGRA ?Place 180 mg into feeding tube daily. ?  ?fluticasone 50 MCG/ACT nasal spray ?Commonly known as: FLONASE ?Place 1 spray into both nostrils in the morning and at bedtime. ?  ?folic acid 1 MG tablet ?Commonly known as: FOLVITE ?Take 1 tablet (1 mg total) by mouth daily. ?What changed: how to take this ?  ?furosemide 20 MG tablet ?Commonly known as: LASIX ?Place 1 tablet (20 mg total) into feeding tube daily as needed for fluid or edema. ?  ?guaiFENesin 100 MG/5ML liquid ?Commonly known as: ROBITUSSIN ?Place 400 mg into feeding tube 3 (three) times daily. ?  ?hydrOXYzine 25 MG tablet ?Commonly known as: ATARAX ?Place 25 mg into feeding tube every 6 (six) hours as needed for anxiety. ?  ?ipratropium-albuterol 0.5-2.5 (3) MG/3ML Soln ?Commonly known as: DUONEB ?Take 3 mLs by nebulization every 6 (six) hours as needed (shortness of breath). ?  ?JEVITY 1.5 CAL PO ?Give 50 mLs by tube in the morning and at bedtime. ?  ?lactulose 10 GM/15ML solution ?Commonly known as: [place] ?Place 20 g into feeding tube 2 (two) times daily as needed for mild constipation. ?  ?levalbuterol 1.25 MG/3ML nebulizer solution ?Commonly known as: XOPENEX ?Take 1.25 mg by nebulization every 6 (six) hours. For 30 days starting on 03-23-21 ?  ?levETIRAcetam 100 MG/ML solution ?Commonly known as: Keppra ?Place 5 mLs (500 mg total) into feeding tube 2 (two) times daily. ?  ?levothyroxine 25 MCG tablet ?Commonly known as: SYNTHROID ?Place 25 mcg into feeding tube daily before breakfast. ?  ?midodrine 10 MG tablet ?Commonly known as: PROAMATINE ?Place 1 tablet (10 mg total) into feeding tube 3  (three) times daily with meals. ?What changed:  ?medication strength ?how much to take ?how to take this ?  ?multivitamin with minerals Tabs tablet ?Take 1 tablet by mouth daily. ?What changed: how to ta

## 2021-05-02 DIAGNOSIS — J9620 Acute and chronic respiratory failure, unspecified whether with hypoxia or hypercapnia: Secondary | ICD-10-CM | POA: Diagnosis not present

## 2021-05-02 DIAGNOSIS — N390 Urinary tract infection, site not specified: Secondary | ICD-10-CM | POA: Diagnosis not present

## 2021-05-02 DIAGNOSIS — I480 Paroxysmal atrial fibrillation: Secondary | ICD-10-CM | POA: Diagnosis not present

## 2021-05-02 DIAGNOSIS — E559 Vitamin D deficiency, unspecified: Secondary | ICD-10-CM | POA: Diagnosis not present

## 2021-05-02 DIAGNOSIS — F419 Anxiety disorder, unspecified: Secondary | ICD-10-CM | POA: Diagnosis not present

## 2021-05-02 DIAGNOSIS — Z9911 Dependence on respirator [ventilator] status: Secondary | ICD-10-CM | POA: Diagnosis not present

## 2021-05-02 DIAGNOSIS — I5023 Acute on chronic systolic (congestive) heart failure: Secondary | ICD-10-CM | POA: Diagnosis not present

## 2021-05-02 DIAGNOSIS — L89153 Pressure ulcer of sacral region, stage 3: Secondary | ICD-10-CM | POA: Diagnosis not present

## 2021-05-02 DIAGNOSIS — Z430 Encounter for attention to tracheostomy: Secondary | ICD-10-CM | POA: Diagnosis not present

## 2021-05-02 DIAGNOSIS — G894 Chronic pain syndrome: Secondary | ICD-10-CM | POA: Diagnosis not present

## 2021-05-02 DIAGNOSIS — G4089 Other seizures: Secondary | ICD-10-CM | POA: Diagnosis not present

## 2021-05-02 DIAGNOSIS — R1310 Dysphagia, unspecified: Secondary | ICD-10-CM | POA: Diagnosis not present

## 2021-05-02 DIAGNOSIS — Z431 Encounter for attention to gastrostomy: Secondary | ICD-10-CM | POA: Diagnosis not present

## 2021-05-02 DIAGNOSIS — G4733 Obstructive sleep apnea (adult) (pediatric): Secondary | ICD-10-CM | POA: Diagnosis not present

## 2021-05-02 DIAGNOSIS — F32A Depression, unspecified: Secondary | ICD-10-CM | POA: Diagnosis not present

## 2021-05-02 DIAGNOSIS — I959 Hypotension, unspecified: Secondary | ICD-10-CM | POA: Diagnosis not present

## 2021-05-03 LAB — CULTURE, BLOOD (ROUTINE X 2)
Culture: NO GROWTH
Special Requests: ADEQUATE

## 2021-05-04 DIAGNOSIS — J9620 Acute and chronic respiratory failure, unspecified whether with hypoxia or hypercapnia: Secondary | ICD-10-CM | POA: Diagnosis not present

## 2021-05-04 LAB — CULTURE, BLOOD (ROUTINE X 2)
Culture: NO GROWTH
Culture: NO GROWTH

## 2021-05-07 ENCOUNTER — Emergency Department (HOSPITAL_COMMUNITY)
Admission: EM | Admit: 2021-05-07 | Discharge: 2021-05-07 | Disposition: A | Discharge status: Home / Self Care | Payer: Medicare Other | Source: Home / Self Care | Attending: Emergency Medicine | Admitting: Emergency Medicine

## 2021-05-07 ENCOUNTER — Other Ambulatory Visit: Payer: Self-pay

## 2021-05-07 ENCOUNTER — Encounter (HOSPITAL_COMMUNITY): Payer: Self-pay

## 2021-05-07 DIAGNOSIS — R1310 Dysphagia, unspecified: Secondary | ICD-10-CM | POA: Diagnosis not present

## 2021-05-07 DIAGNOSIS — J9622 Acute and chronic respiratory failure with hypercapnia: Secondary | ICD-10-CM | POA: Diagnosis not present

## 2021-05-07 DIAGNOSIS — I5023 Acute on chronic systolic (congestive) heart failure: Secondary | ICD-10-CM | POA: Diagnosis not present

## 2021-05-07 DIAGNOSIS — G250 Essential tremor: Secondary | ICD-10-CM | POA: Diagnosis not present

## 2021-05-07 DIAGNOSIS — F32A Depression, unspecified: Secondary | ICD-10-CM | POA: Diagnosis not present

## 2021-05-07 DIAGNOSIS — N310 Uninhibited neuropathic bladder, not elsewhere classified: Secondary | ICD-10-CM | POA: Diagnosis not present

## 2021-05-07 DIAGNOSIS — Z515 Encounter for palliative care: Secondary | ICD-10-CM | POA: Diagnosis not present

## 2021-05-07 DIAGNOSIS — L89153 Pressure ulcer of sacral region, stage 3: Secondary | ICD-10-CM | POA: Diagnosis not present

## 2021-05-07 DIAGNOSIS — G9340 Encephalopathy, unspecified: Secondary | ICD-10-CM | POA: Diagnosis not present

## 2021-05-07 DIAGNOSIS — I480 Paroxysmal atrial fibrillation: Secondary | ICD-10-CM | POA: Diagnosis not present

## 2021-05-07 DIAGNOSIS — J90 Pleural effusion, not elsewhere classified: Secondary | ICD-10-CM | POA: Diagnosis not present

## 2021-05-07 DIAGNOSIS — E8729 Other acidosis: Secondary | ICD-10-CM | POA: Diagnosis present

## 2021-05-07 DIAGNOSIS — G9341 Metabolic encephalopathy: Secondary | ICD-10-CM | POA: Diagnosis not present

## 2021-05-07 DIAGNOSIS — D638 Anemia in other chronic diseases classified elsewhere: Secondary | ICD-10-CM | POA: Diagnosis present

## 2021-05-07 DIAGNOSIS — G319 Degenerative disease of nervous system, unspecified: Secondary | ICD-10-CM | POA: Diagnosis not present

## 2021-05-07 DIAGNOSIS — N400 Enlarged prostate without lower urinary tract symptoms: Secondary | ICD-10-CM | POA: Diagnosis not present

## 2021-05-07 DIAGNOSIS — E039 Hypothyroidism, unspecified: Secondary | ICD-10-CM | POA: Diagnosis not present

## 2021-05-07 DIAGNOSIS — J690 Pneumonitis due to inhalation of food and vomit: Secondary | ICD-10-CM | POA: Diagnosis not present

## 2021-05-07 DIAGNOSIS — N390 Urinary tract infection, site not specified: Secondary | ICD-10-CM | POA: Diagnosis not present

## 2021-05-07 DIAGNOSIS — R569 Unspecified convulsions: Secondary | ICD-10-CM | POA: Insufficient documentation

## 2021-05-07 DIAGNOSIS — G894 Chronic pain syndrome: Secondary | ICD-10-CM | POA: Diagnosis not present

## 2021-05-07 DIAGNOSIS — Z20822 Contact with and (suspected) exposure to covid-19: Secondary | ICD-10-CM | POA: Diagnosis not present

## 2021-05-07 DIAGNOSIS — R64 Cachexia: Secondary | ICD-10-CM | POA: Diagnosis present

## 2021-05-07 DIAGNOSIS — Z431 Encounter for attention to gastrostomy: Secondary | ICD-10-CM | POA: Diagnosis not present

## 2021-05-07 DIAGNOSIS — E875 Hyperkalemia: Secondary | ICD-10-CM | POA: Diagnosis not present

## 2021-05-07 DIAGNOSIS — Z9911 Dependence on respirator [ventilator] status: Secondary | ICD-10-CM | POA: Diagnosis not present

## 2021-05-07 DIAGNOSIS — Z66 Do not resuscitate: Secondary | ICD-10-CM | POA: Diagnosis not present

## 2021-05-07 DIAGNOSIS — J449 Chronic obstructive pulmonary disease, unspecified: Secondary | ICD-10-CM | POA: Insufficient documentation

## 2021-05-07 DIAGNOSIS — E785 Hyperlipidemia, unspecified: Secondary | ICD-10-CM | POA: Diagnosis present

## 2021-05-07 DIAGNOSIS — Z8249 Family history of ischemic heart disease and other diseases of the circulatory system: Secondary | ICD-10-CM | POA: Diagnosis not present

## 2021-05-07 DIAGNOSIS — D72829 Elevated white blood cell count, unspecified: Secondary | ICD-10-CM | POA: Insufficient documentation

## 2021-05-07 DIAGNOSIS — R4182 Altered mental status, unspecified: Secondary | ICD-10-CM | POA: Diagnosis not present

## 2021-05-07 DIAGNOSIS — J9621 Acute and chronic respiratory failure with hypoxia: Secondary | ICD-10-CM | POA: Diagnosis not present

## 2021-05-07 DIAGNOSIS — I5042 Chronic combined systolic (congestive) and diastolic (congestive) heart failure: Secondary | ICD-10-CM | POA: Diagnosis present

## 2021-05-07 DIAGNOSIS — I4891 Unspecified atrial fibrillation: Secondary | ICD-10-CM | POA: Diagnosis not present

## 2021-05-07 DIAGNOSIS — G988 Other disorders of nervous system: Secondary | ICD-10-CM | POA: Diagnosis not present

## 2021-05-07 DIAGNOSIS — J9620 Acute and chronic respiratory failure, unspecified whether with hypoxia or hypercapnia: Secondary | ICD-10-CM | POA: Diagnosis not present

## 2021-05-07 DIAGNOSIS — C3432 Malignant neoplasm of lower lobe, left bronchus or lung: Secondary | ICD-10-CM | POA: Diagnosis present

## 2021-05-07 DIAGNOSIS — I82402 Acute embolism and thrombosis of unspecified deep veins of left lower extremity: Secondary | ICD-10-CM | POA: Diagnosis not present

## 2021-05-07 DIAGNOSIS — I700 Atherosclerosis of aorta: Secondary | ICD-10-CM | POA: Diagnosis not present

## 2021-05-07 DIAGNOSIS — E871 Hypo-osmolality and hyponatremia: Secondary | ICD-10-CM | POA: Diagnosis present

## 2021-05-07 DIAGNOSIS — E43 Unspecified severe protein-calorie malnutrition: Secondary | ICD-10-CM | POA: Diagnosis not present

## 2021-05-07 DIAGNOSIS — R627 Adult failure to thrive: Secondary | ICD-10-CM | POA: Diagnosis not present

## 2021-05-07 DIAGNOSIS — R0602 Shortness of breath: Secondary | ICD-10-CM | POA: Diagnosis not present

## 2021-05-07 DIAGNOSIS — G4089 Other seizures: Secondary | ICD-10-CM | POA: Diagnosis not present

## 2021-05-07 DIAGNOSIS — Z825 Family history of asthma and other chronic lower respiratory diseases: Secondary | ICD-10-CM | POA: Diagnosis not present

## 2021-05-07 DIAGNOSIS — R918 Other nonspecific abnormal finding of lung field: Secondary | ICD-10-CM | POA: Diagnosis not present

## 2021-05-07 DIAGNOSIS — Z1621 Resistance to vancomycin: Secondary | ICD-10-CM | POA: Diagnosis present

## 2021-05-07 DIAGNOSIS — G40909 Epilepsy, unspecified, not intractable, without status epilepticus: Secondary | ICD-10-CM | POA: Diagnosis present

## 2021-05-07 DIAGNOSIS — Z430 Encounter for attention to tracheostomy: Secondary | ICD-10-CM | POA: Diagnosis not present

## 2021-05-07 DIAGNOSIS — M6281 Muscle weakness (generalized): Secondary | ICD-10-CM | POA: Diagnosis not present

## 2021-05-07 DIAGNOSIS — J9503 Malfunction of tracheostomy stoma: Secondary | ICD-10-CM | POA: Diagnosis not present

## 2021-05-07 DIAGNOSIS — F419 Anxiety disorder, unspecified: Secondary | ICD-10-CM | POA: Diagnosis not present

## 2021-05-07 DIAGNOSIS — B952 Enterococcus as the cause of diseases classified elsewhere: Secondary | ICD-10-CM | POA: Diagnosis present

## 2021-05-07 DIAGNOSIS — I5032 Chronic diastolic (congestive) heart failure: Secondary | ICD-10-CM | POA: Diagnosis not present

## 2021-05-07 DIAGNOSIS — G4733 Obstructive sleep apnea (adult) (pediatric): Secondary | ICD-10-CM | POA: Diagnosis not present

## 2021-05-07 LAB — CBC
HCT: 31.6 % — ABNORMAL LOW (ref 39.0–52.0)
Hemoglobin: 9.5 g/dL — ABNORMAL LOW (ref 13.0–17.0)
MCH: 28.2 pg (ref 26.0–34.0)
MCHC: 30.1 g/dL (ref 30.0–36.0)
MCV: 93.8 fL (ref 80.0–100.0)
Platelets: 322 10*3/uL (ref 150–400)
RBC: 3.37 MIL/uL — ABNORMAL LOW (ref 4.22–5.81)
RDW: 16.6 % — ABNORMAL HIGH (ref 11.5–15.5)
WBC: 11.5 10*3/uL — ABNORMAL HIGH (ref 4.0–10.5)
nRBC: 0 % (ref 0.0–0.2)

## 2021-05-07 LAB — BASIC METABOLIC PANEL
Anion gap: 5 (ref 5–15)
BUN: 17 mg/dL (ref 8–23)
CO2: 26 mmol/L (ref 22–32)
Calcium: 7.2 mg/dL — ABNORMAL LOW (ref 8.9–10.3)
Chloride: 111 mmol/L (ref 98–111)
Creatinine, Ser: 0.36 mg/dL — ABNORMAL LOW (ref 0.61–1.24)
GFR, Estimated: >60 mL/min (ref >60)
Glucose, Bld: 112 mg/dL — ABNORMAL HIGH (ref 70–99)
Potassium: 3.7 mmol/L (ref 3.5–5.1)
Sodium: 142 mmol/L (ref 135–145)

## 2021-05-07 LAB — CBG MONITORING, ED: Glucose-Capillary: 100 mg/dL — ABNORMAL HIGH (ref 70–99)

## 2021-05-07 MED ORDER — LEVETIRACETAM 100 MG/ML PO SOLN: 750.0000 mg | Freq: Two times a day (BID) | ORAL | 12 refills | Status: DC

## 2021-05-07 MED ORDER — LEVETIRACETAM 100 MG/ML PO SOLN
750.0000 mg | Freq: Two times a day (BID) | ORAL | Status: DC
  Administered 2021-05-07: 750 mg via ORAL
  Filled 2021-05-07: qty 10

## 2021-05-07 NOTE — ED Triage Notes (Signed)
Pt had a 8-10 minutes of staring off into space, after that he started acting back normal, does have a trach and is on vent  settings of PC30, rr of 16, peep of 5, and 35% ?

## 2021-05-07 NOTE — ED Provider Notes (Signed)
?[place] ?Provider Note ? ? ?CSN: [id number] ?Arrival date & time: 05/07/21  2004 ? ?  ? ?History ? ?Chief Complaint  ?Patient presents with  ? Seizures  ? ? ?Austin Davis is a 81 y.o. 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  This lasted 8-10 minutes per EMS report, where patient was starring at the ceiling and not responding or moving his extremities, and then he recovered and was moving again.  He does not talk at baseline.   ? ?External records reviewed, pt discharged 05/01/2021 after evaluation for possible new onset seizures.  Had EEG overnight unrevealing, MRI/MRA brain with no focal findings to explain symptoms, and was discharged on Keppra 500 mg BID, which has been given.   ? ?HPI ? ?  ? ?Home Medications ?Prior to Admission medications   ?Medication Sig Start Date End Date Taking? Authorizing Provider  ?acetaminophen (TYLENOL) 650 MG CR tablet Take 650 mg by mouth every 8 (eight) hours as needed for pain. Via tube    [provider]  ?ALPRAZolam Duanne Moron) 0.5 MG tablet Place 1 tablet (0.5 mg total) into feeding tube every 8 (eight) hours as needed for anxiety. 05/01/21   Thurnell Lose, MD  ?cephALEXin (KEFLEX) 250 MG/5ML suspension Take 10 mLs (500 mg total) by mouth 3 (three) times daily for 10 days. ?Patient taking differently: Take 500 mg by mouth every 8 (eight) hours. For 10 days 04/29/21 05/09/21  [name], MD  ?Cholecalciferol (VITAMIN D3 SUPER STRENGTH) 50 MCG (2000 UT) TABS Give 2,000 Units by tube daily.    [provider]  ?clonazePAM (KLONOPIN) 0.5 MG tablet Place 1 tablet (0.5 mg total) into feeding tube 2 (two) times daily. 05/01/21   Thurnell Lose, MD  ?escitalopram (LEXAPRO) 10 MG tablet Place 10 mg into feeding tube daily.    [provider]   ?esomeprazole (NEXIUM) 40 MG packet Take 40 mg by mouth daily. Per G tube    [provider]  ?fexofenadine (ALLEGRA) 180 MG tablet Place 180 mg into feeding tube daily.    [provider]  ?fluticasone (FLONASE) 50 MCG/ACT nasal spray Place 1 spray into both nostrils in the morning and at bedtime.    [provider]  ?folic acid (FOLVITE) 1 MG tablet Take 1 tablet (1 mg total) by mouth daily. ?Patient taking differently: Place 1 mg into feeding tube daily. 09/29/20   [name], MD  ?furosemide (LASIX) 20 MG tablet Place 1 tablet (20 mg total) into feeding tube daily as needed for fluid or edema. 04/16/21   [name], DO  ?guaiFENesin (ROBITUSSIN) 100 MG/5ML liquid Place 400 mg into feeding tube 3 (three) times daily.    [provider]  ?hydrOXYzine (ATARAX) 25 MG tablet Place 25 mg into feeding tube every 6 (six) hours as needed for anxiety.    [provider]  ?ipratropium-albuterol (DUONEB) 0.5-2.5 (3) MG/3ML SOLN Take 3 mLs by nebulization every 6 (six) hours as needed (shortness of breath). 05/01/21   Thurnell Lose, MD  ?Lactobacillus Rhamnosus, GG, (CULTURELLE) CAPS Give 1 capsule by tube daily.    [provider]  ?lactulose (CHRONULAC) 10 GM/15ML solution Place 20 g into feeding tube 2 (two) times daily as needed for mild constipation.    [provider]  ?levalbuterol Penne Lash) 1.25 MG/3ML nebulizer  solution Take 1.25 mg by nebulization every 6 (six) hours. For 30 days starting on 03-23-21    [provider]  ?levETIRAcetam (KEPPRA) 100 MG/ML solution Place 7.5 mLs (750 mg total) into feeding tube 2 (two) times daily. 05/07/21 07/06/21  Wyvonnia Dusky, MD  ?levothyroxine (SYNTHROID) 25 MCG tablet Place 25 mcg into feeding tube daily before breakfast.    [provider]  ?midodrine (PROAMATINE) 10 MG tablet Place 1 tablet (10 mg total) into feeding tube 3 (three) times daily with meals. 05/01/21   Thurnell Lose, MD  ?Multiple Vitamin (MULTIVITAMIN WITH MINERALS) TABS tablet Take 1 tablet by mouth daily. ?Patient taking differently: Place 1 tablet into feeding tube daily. 09/29/20   [name], MD  ?Nutritional Supplements (JEVITY 1.5 CAL PO) Give 50 mLs by tube in the morning and at bedtime.    [provider]  ?Omega-3 Fatty Acids (SUPER OMEGA 3 EPA/DHA) 1000 MG CAPS Take 1,000 mg by mouth daily.    [provider]  ?Pollen Extracts (PROSTAT PO) Take 30 mLs by mouth in the morning and at bedtime.    [provider]  ?polyethylene glycol powder (GLYCOLAX/MIRALAX) 17 GM/SCOOP powder Place 17 g into feeding tube every other day.    [provider]  ?propranolol (INDERAL) 10 MG tablet Place 1 tablet (10 mg total) into feeding tube daily as needed (for tremors in AM. do not administer if heart rate is below 60 or systolic blood pressure is below 90.). 05/01/21   Thurnell Lose, MD  ?sodium chloride (OCEAN) 0.65 % SOLN nasal spray Place 1 spray into both nostrils every 4 (four) hours as needed for congestion.    [provider]  ?Sodium Chloride, Inhalant, 7 % NEBU Take 1 Dose by nebulization every 6 (six) hours. For 3 days ?Patient not taking: Reported on 04/30/2021 04/30/21   [provider]  ?tamsulosin (FLOMAX) 0.4 MG CAPS capsule Take 0.4 mg by mouth daily.    [provider]  ?vitamin C (ASCORBIC ACID) 500 MG tablet Place 500 mg into feeding tube daily.    [provider]  ?zinc gluconate 50 MG tablet Place 50 mg into feeding tube 2 (two) times daily.    [provider]  ?   ? ?Allergies    ?Prednisone and Cortisone   ? ?Review of Systems   ?Review of Systems ? ?Physical Exam ?Updated Vital Signs ?BP (!) 156/89   Pulse 93   Temp 98.4 ?F (36.9 ?C)   Resp 13   Ht 5\' 10"  (1.778 m)   SpO2 100%   BMI 18.06 kg/m?  ?Physical Exam ?HENT:  ?   Head: Normocephalic and atraumatic.  ?Eyes:  ?   Conjunctiva/sclera: Conjunctivae  normal.  ?   Pupils: Pupils are equal, round, and reactive to light.  ?Cardiovascular:  ?   Rate and Rhythm: Normal rate and regular rhythm.  ?Pulmonary:  ?   Effort: Pulmonary effort is normal. No respiratory distress.  ?   Comments: On ventilator, no hypoxia ?Abdominal:  ?   General: There is no distension.  ?   Tenderness: There is no abdominal tenderness.  ?Skin: ?   General: Skin is warm and dry.  ?Neurological:  ?   Mental Status: He is alert. Mental status is at baseline.  ?   Comments: Patient nodding head appropriately to questions, does not verbalize  ? ? ?ED Results / Procedures / Treatments   ?Labs ?(all labs ordered are  listed, but only abnormal results are displayed) ?Labs Reviewed  ?BASIC METABOLIC PANEL - Abnormal; Notable for the following components:  ?    Result Value  ? Glucose, Bld 112 (*)   ? Creatinine, Ser 0.36 (*)   ? Calcium 7.2 (*)   ? All other components within normal limits  ?CBC - Abnormal; Notable for the following components:  ? WBC 11.5 (*)   ? RBC 3.37 (*)   ? Hemoglobin 9.5 (*)   ? HCT 31.6 (*)   ? RDW 16.6 (*)   ? All other components within normal limits  ?CBG MONITORING, ED - Abnormal; Notable for the following components:  ? Glucose-Capillary 100 (*)   ? All other components within normal limits  ? ? ?EKG ?EKG Interpretation ? ?Date/Time:  Tuesday May 07 2021 20:17:24 EDT ?Ventricular Rate:  98 ?PR Interval:  161 ?QRS Duration: 108 ?QT Interval:  362 ?QTC Calculation: 463 ?R Axis:   83 ?Text Interpretation: Sinus rhythm Borderline right axis deviation Confirmed by Octaviano Glow [phone]) on 05/07/2021 8:18:14 PM ? ?Radiology ?No results found. ? ?Procedures ?Procedures  ? ? ?Medications Ordered in ED ?Medications - No data to display ? ?ED Course/ Medical Decision Making/ A&P ?Clinical Course as of 05/08/21 1053  ?Tue May 07, 2021  ?2236 Spoke to neurologist DR Leonel Ramsay who reviewed recent hospital workup; he advised increasing patient's keppra dosing to 750 mg BID.   Otherwise workup is unremarkable - no evidence of sepsis at this time, pt is stable for return to SNF [MT]  ?  ?Clinical Course User Index ?[MT] Wyvonnia Dusky, MD  ? ?                        ?Medical Decision Making ?[place]

## 2021-05-07 NOTE — Discharge Instructions (Addendum)
Our neurologist recommended increasing [place]' Keppra dosing to 750 mg twice daily.  A new prescription was provided.   ? ?It is still not clear whether these are truly seizure events, but it was felt best to take preventative measures with the medication.  [place] had neuroimaging, MRI of the brain, and EEG performed earlier this week and prior hospitalization. ?

## 2021-05-08 ENCOUNTER — Emergency Department (HOSPITAL_COMMUNITY): Payer: Medicare Other

## 2021-05-08 ENCOUNTER — Other Ambulatory Visit: Payer: Self-pay

## 2021-05-08 ENCOUNTER — Inpatient Hospital Stay (HOSPITAL_COMMUNITY)
Admission: EM | Admit: 2021-05-08 | Discharge: 2021-05-10 | DRG: 208 | Disposition: A | Discharge status: Skilled Nursing Facility | Payer: Medicare Other | Source: Skilled Nursing Facility | Attending: Internal Medicine | Admitting: Internal Medicine

## 2021-05-08 ENCOUNTER — Encounter (HOSPITAL_COMMUNITY): Payer: Self-pay | Admitting: *Deleted

## 2021-05-08 DIAGNOSIS — J449 Chronic obstructive pulmonary disease, unspecified: Secondary | ICD-10-CM | POA: Diagnosis present

## 2021-05-08 DIAGNOSIS — J90 Pleural effusion, not elsewhere classified: Secondary | ICD-10-CM | POA: Diagnosis not present

## 2021-05-08 DIAGNOSIS — J189 Pneumonia, unspecified organism: Secondary | ICD-10-CM | POA: Diagnosis present

## 2021-05-08 DIAGNOSIS — R0602 Shortness of breath: Secondary | ICD-10-CM | POA: Diagnosis present

## 2021-05-08 DIAGNOSIS — D638 Anemia in other chronic diseases classified elsewhere: Secondary | ICD-10-CM | POA: Diagnosis present

## 2021-05-08 DIAGNOSIS — E8729 Other acidosis: Secondary | ICD-10-CM | POA: Diagnosis present

## 2021-05-08 DIAGNOSIS — I5032 Chronic diastolic (congestive) heart failure: Secondary | ICD-10-CM | POA: Diagnosis present

## 2021-05-08 DIAGNOSIS — Z8249 Family history of ischemic heart disease and other diseases of the circulatory system: Secondary | ICD-10-CM | POA: Diagnosis not present

## 2021-05-08 DIAGNOSIS — R64 Cachexia: Secondary | ICD-10-CM | POA: Diagnosis present

## 2021-05-08 DIAGNOSIS — R569 Unspecified convulsions: Secondary | ICD-10-CM

## 2021-05-08 DIAGNOSIS — B952 Enterococcus as the cause of diseases classified elsewhere: Secondary | ICD-10-CM | POA: Diagnosis present

## 2021-05-08 DIAGNOSIS — I5023 Acute on chronic systolic (congestive) heart failure: Secondary | ICD-10-CM | POA: Diagnosis not present

## 2021-05-08 DIAGNOSIS — Z20822 Contact with and (suspected) exposure to covid-19: Secondary | ICD-10-CM | POA: Diagnosis present

## 2021-05-08 DIAGNOSIS — Z79890 Hormone replacement therapy (postmenopausal): Secondary | ICD-10-CM

## 2021-05-08 DIAGNOSIS — E871 Hypo-osmolality and hyponatremia: Secondary | ICD-10-CM | POA: Diagnosis present

## 2021-05-08 DIAGNOSIS — J9621 Acute and chronic respiratory failure with hypoxia: Secondary | ICD-10-CM | POA: Diagnosis present

## 2021-05-08 DIAGNOSIS — J9620 Acute and chronic respiratory failure, unspecified whether with hypoxia or hypercapnia: Secondary | ICD-10-CM | POA: Diagnosis not present

## 2021-05-08 DIAGNOSIS — J9503 Malfunction of tracheostomy stoma: Principal | ICD-10-CM | POA: Diagnosis present

## 2021-05-08 DIAGNOSIS — G250 Essential tremor: Secondary | ICD-10-CM | POA: Diagnosis not present

## 2021-05-08 DIAGNOSIS — Z825 Family history of asthma and other chronic lower respiratory diseases: Secondary | ICD-10-CM

## 2021-05-08 DIAGNOSIS — G4733 Obstructive sleep apnea (adult) (pediatric): Secondary | ICD-10-CM | POA: Diagnosis not present

## 2021-05-08 DIAGNOSIS — G988 Other disorders of nervous system: Secondary | ICD-10-CM | POA: Diagnosis not present

## 2021-05-08 DIAGNOSIS — Z808 Family history of malignant neoplasm of other organs or systems: Secondary | ICD-10-CM

## 2021-05-08 DIAGNOSIS — I700 Atherosclerosis of aorta: Secondary | ICD-10-CM | POA: Diagnosis not present

## 2021-05-08 DIAGNOSIS — E8809 Other disorders of plasma-protein metabolism, not elsewhere classified: Secondary | ICD-10-CM | POA: Diagnosis present

## 2021-05-08 DIAGNOSIS — J690 Pneumonitis due to inhalation of food and vomit: Secondary | ICD-10-CM | POA: Diagnosis present

## 2021-05-08 DIAGNOSIS — G40909 Epilepsy, unspecified, not intractable, without status epilepticus: Secondary | ICD-10-CM | POA: Diagnosis present

## 2021-05-08 DIAGNOSIS — J9622 Acute and chronic respiratory failure with hypercapnia: Secondary | ICD-10-CM | POA: Diagnosis present

## 2021-05-08 DIAGNOSIS — Z66 Do not resuscitate: Secondary | ICD-10-CM | POA: Diagnosis present

## 2021-05-08 DIAGNOSIS — Z823 Family history of stroke: Secondary | ICD-10-CM

## 2021-05-08 DIAGNOSIS — E785 Hyperlipidemia, unspecified: Secondary | ICD-10-CM | POA: Diagnosis present

## 2021-05-08 DIAGNOSIS — I959 Hypotension, unspecified: Secondary | ICD-10-CM | POA: Diagnosis present

## 2021-05-08 DIAGNOSIS — R4182 Altered mental status, unspecified: Secondary | ICD-10-CM | POA: Diagnosis not present

## 2021-05-08 DIAGNOSIS — I9589 Other hypotension: Secondary | ICD-10-CM | POA: Diagnosis present

## 2021-05-08 DIAGNOSIS — R918 Other nonspecific abnormal finding of lung field: Secondary | ICD-10-CM | POA: Diagnosis not present

## 2021-05-08 DIAGNOSIS — I5042 Chronic combined systolic (congestive) and diastolic (congestive) heart failure: Secondary | ICD-10-CM | POA: Diagnosis present

## 2021-05-08 DIAGNOSIS — G9340 Encephalopathy, unspecified: Secondary | ICD-10-CM | POA: Diagnosis present

## 2021-05-08 DIAGNOSIS — E876 Hypokalemia: Secondary | ICD-10-CM | POA: Diagnosis not present

## 2021-05-08 DIAGNOSIS — M6281 Muscle weakness (generalized): Secondary | ICD-10-CM | POA: Diagnosis not present

## 2021-05-08 DIAGNOSIS — E039 Hypothyroidism, unspecified: Secondary | ICD-10-CM | POA: Diagnosis not present

## 2021-05-08 DIAGNOSIS — Z9911 Dependence on respirator [ventilator] status: Secondary | ICD-10-CM | POA: Diagnosis not present

## 2021-05-08 DIAGNOSIS — I2510 Atherosclerotic heart disease of native coronary artery without angina pectoris: Secondary | ICD-10-CM | POA: Diagnosis present

## 2021-05-08 DIAGNOSIS — I82402 Acute embolism and thrombosis of unspecified deep veins of left lower extremity: Secondary | ICD-10-CM | POA: Diagnosis not present

## 2021-05-08 DIAGNOSIS — I480 Paroxysmal atrial fibrillation: Secondary | ICD-10-CM | POA: Diagnosis present

## 2021-05-08 DIAGNOSIS — Z1621 Resistance to vancomycin: Secondary | ICD-10-CM | POA: Diagnosis present

## 2021-05-08 DIAGNOSIS — N400 Enlarged prostate without lower urinary tract symptoms: Secondary | ICD-10-CM | POA: Diagnosis not present

## 2021-05-08 DIAGNOSIS — N390 Urinary tract infection, site not specified: Secondary | ICD-10-CM

## 2021-05-08 DIAGNOSIS — Z431 Encounter for attention to gastrostomy: Secondary | ICD-10-CM | POA: Diagnosis not present

## 2021-05-08 DIAGNOSIS — N310 Uninhibited neuropathic bladder, not elsewhere classified: Secondary | ICD-10-CM | POA: Diagnosis not present

## 2021-05-08 DIAGNOSIS — E875 Hyperkalemia: Secondary | ICD-10-CM | POA: Diagnosis not present

## 2021-05-08 DIAGNOSIS — G4089 Other seizures: Secondary | ICD-10-CM | POA: Diagnosis not present

## 2021-05-08 DIAGNOSIS — R627 Adult failure to thrive: Secondary | ICD-10-CM | POA: Diagnosis not present

## 2021-05-08 DIAGNOSIS — C3432 Malignant neoplasm of lower lobe, left bronchus or lung: Secondary | ICD-10-CM | POA: Diagnosis present

## 2021-05-08 DIAGNOSIS — Z430 Encounter for attention to tracheostomy: Secondary | ICD-10-CM | POA: Diagnosis not present

## 2021-05-08 DIAGNOSIS — I4891 Unspecified atrial fibrillation: Secondary | ICD-10-CM | POA: Diagnosis not present

## 2021-05-08 DIAGNOSIS — E43 Unspecified severe protein-calorie malnutrition: Secondary | ICD-10-CM | POA: Diagnosis present

## 2021-05-08 DIAGNOSIS — Z86718 Personal history of other venous thrombosis and embolism: Secondary | ICD-10-CM

## 2021-05-08 DIAGNOSIS — Z79899 Other long term (current) drug therapy: Secondary | ICD-10-CM

## 2021-05-08 DIAGNOSIS — G319 Degenerative disease of nervous system, unspecified: Secondary | ICD-10-CM | POA: Diagnosis not present

## 2021-05-08 DIAGNOSIS — G894 Chronic pain syndrome: Secondary | ICD-10-CM | POA: Diagnosis not present

## 2021-05-08 DIAGNOSIS — R1310 Dysphagia, unspecified: Secondary | ICD-10-CM | POA: Diagnosis not present

## 2021-05-08 DIAGNOSIS — F419 Anxiety disorder, unspecified: Secondary | ICD-10-CM | POA: Diagnosis not present

## 2021-05-08 DIAGNOSIS — G9341 Metabolic encephalopathy: Secondary | ICD-10-CM | POA: Diagnosis present

## 2021-05-08 DIAGNOSIS — Z515 Encounter for palliative care: Secondary | ICD-10-CM | POA: Diagnosis not present

## 2021-05-08 DIAGNOSIS — F32A Depression, unspecified: Secondary | ICD-10-CM | POA: Diagnosis not present

## 2021-05-08 LAB — CBC WITH DIFFERENTIAL/PLATELET
Abs Immature Granulocytes: 0.09 10*3/uL — ABNORMAL HIGH (ref 0.00–0.07)
Basophils Absolute: 0 10*3/uL (ref 0.0–0.1)
Basophils Relative: 0 %
Eosinophils Absolute: 0 10*3/uL (ref 0.0–0.5)
Eosinophils Relative: 0 %
HCT: 44 % (ref 39.0–52.0)
Hemoglobin: 13 g/dL (ref 13.0–17.0)
Immature Granulocytes: 1 %
Lymphocytes Relative: 2 %
Lymphs Abs: 0.3 10*3/uL — ABNORMAL LOW (ref 0.7–4.0)
MCH: 28.6 pg (ref 26.0–34.0)
MCHC: 29.5 g/dL — ABNORMAL LOW (ref 30.0–36.0)
MCV: 96.9 fL (ref 80.0–100.0)
Monocytes Absolute: 0.6 10*3/uL (ref 0.1–1.0)
Monocytes Relative: 3 %
Neutro Abs: 16.6 10*3/uL — ABNORMAL HIGH (ref 1.7–7.7)
Neutrophils Relative %: 94 %
Platelets: 436 10*3/uL — ABNORMAL HIGH (ref 150–400)
RBC: 4.54 MIL/uL (ref 4.22–5.81)
RDW: 16.2 % — ABNORMAL HIGH (ref 11.5–15.5)
WBC: 17.6 10*3/uL — ABNORMAL HIGH (ref 4.0–10.5)
nRBC: 0 % (ref 0.0–0.2)

## 2021-05-08 LAB — I-STAT ARTERIAL BLOOD GAS, ED
Acid-Base Excess: 10 mmol/L — ABNORMAL HIGH (ref 0.0–2.0)
Acid-Base Excess: 8 mmol/L — ABNORMAL HIGH (ref 0.0–2.0)
Bicarbonate: 40.6 mmol/L — ABNORMAL HIGH (ref 20.0–28.0)
Bicarbonate: 31.3 mmol/L — ABNORMAL HIGH (ref 20.0–28.0)
Calcium, Ion: 1.33 mmol/L (ref 1.15–1.40)
Calcium, Ion: 1.2 mmol/L (ref 1.15–1.40)
HCT: 42 % (ref 39.0–52.0)
HCT: 30 % — ABNORMAL LOW (ref 39.0–52.0)
Hemoglobin: 14.3 g/dL (ref 13.0–17.0)
Hemoglobin: 10.2 g/dL — ABNORMAL LOW (ref 13.0–17.0)
O2 Saturation: 100 %
O2 Saturation: 100 %
Patient temperature: 98.4
Patient temperature: 98.4
Potassium: 5.3 mmol/L — ABNORMAL HIGH (ref 3.5–5.1)
Potassium: 3.9 mmol/L (ref 3.5–5.1)
Sodium: 139 mmol/L (ref 135–145)
Sodium: 139 mmol/L (ref 135–145)
TCO2: 43 mmol/L — ABNORMAL HIGH (ref 22–32)
TCO2: 32 mmol/L (ref 22–32)
pCO2 arterial: 89 mmHg (ref 32–48)
pCO2 arterial: 35.6 mmHg (ref 32–48)
pH, Arterial: 7.266 — ABNORMAL LOW (ref 7.35–7.45)
pH, Arterial: 7.552 — ABNORMAL HIGH (ref 7.35–7.45)
pO2, Arterial: 499 mmHg — ABNORMAL HIGH (ref 83–108)
pO2, Arterial: 158 mmHg — ABNORMAL HIGH (ref 83–108)

## 2021-05-08 LAB — RESP PANEL BY RT-PCR (FLU A&B, COVID) ARPGX2
Influenza A by PCR: NEGATIVE
Influenza B by PCR: NEGATIVE
SARS Coronavirus 2 by RT PCR: NEGATIVE

## 2021-05-08 LAB — MRSA NEXT GEN BY PCR, NASAL: MRSA by PCR Next Gen: NOT DETECTED

## 2021-05-08 LAB — COMPREHENSIVE METABOLIC PANEL
ALT: 31 U/L (ref 0–44)
AST: 24 U/L (ref 15–41)
Albumin: 3.7 g/dL (ref 3.5–5.0)
Alkaline Phosphatase: 138 U/L — ABNORMAL HIGH (ref 38–126)
Anion gap: 6 (ref 5–15)
BUN: 21 mg/dL (ref 8–23)
CO2: 38 mmol/L — ABNORMAL HIGH (ref 22–32)
Calcium: 10.1 mg/dL (ref 8.9–10.3)
Chloride: 99 mmol/L (ref 98–111)
Creatinine, Ser: 0.67 mg/dL (ref 0.61–1.24)
GFR, Estimated: >60 mL/min (ref >60)
Glucose, Bld: 186 mg/dL — ABNORMAL HIGH (ref 70–99)
Potassium: 5.5 mmol/L — ABNORMAL HIGH (ref 3.5–5.1)
Sodium: 143 mmol/L (ref 135–145)
Total Bilirubin: 0.3 mg/dL (ref 0.3–1.2)
Total Protein: 8.3 g/dL — ABNORMAL HIGH (ref 6.5–8.1)

## 2021-05-08 LAB — BRAIN NATRIURETIC PEPTIDE: B Natriuretic Peptide: 227.4 pg/mL — ABNORMAL HIGH (ref 0.0–100.0)

## 2021-05-08 LAB — URINALYSIS, MICROSCOPIC (REFLEX): WBC, UA: >50 WBC/hpf (ref 0–5)

## 2021-05-08 LAB — LACTIC ACID, PLASMA
Lactic Acid, Venous: 2 mmol/L (ref 0.5–1.9)
Lactic Acid, Venous: 3.9 mmol/L (ref 0.5–1.9)

## 2021-05-08 LAB — URINALYSIS, ROUTINE W REFLEX MICROSCOPIC
Bilirubin Urine: NEGATIVE
Glucose, UA: NEGATIVE mg/dL
Hgb urine dipstick: NEGATIVE
Ketones, ur: NEGATIVE mg/dL
Nitrite: NEGATIVE
Protein, ur: 100 mg/dL — AB
Specific Gravity, Urine: 1.028 (ref 1.005–1.030)
pH: 5 (ref 5.0–8.0)

## 2021-05-08 LAB — PROTIME-INR
INR: 1 (ref 0.8–1.2)
Prothrombin Time: 13.3 seconds (ref 11.4–15.2)

## 2021-05-08 LAB — MAGNESIUM: Magnesium: 2.4 mg/dL (ref 1.7–2.4)

## 2021-05-08 IMAGING — CT CT HEAD W/O CM
4 series · 17 of 47 positions shown, 19 images · non-contrast
Comparison: [DATE]

CLINICAL DATA: Mental status change, recurrent seizures



[Series 4: head wo · axial · 0.42mm/px · z∈[+896,+1030]mm · 7 of 37 slices shown, 9 images]
[im 5/37  brain]
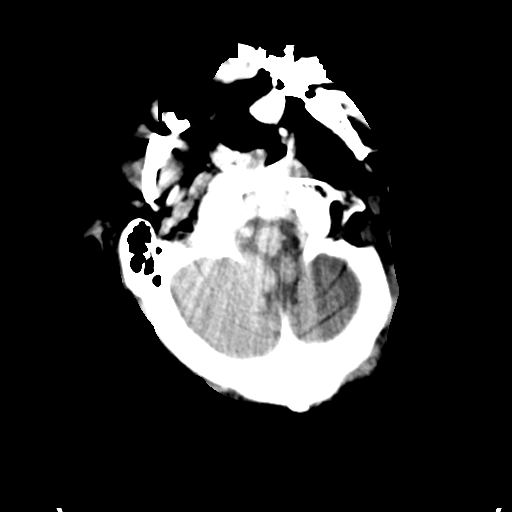
[im 5/37  bone]
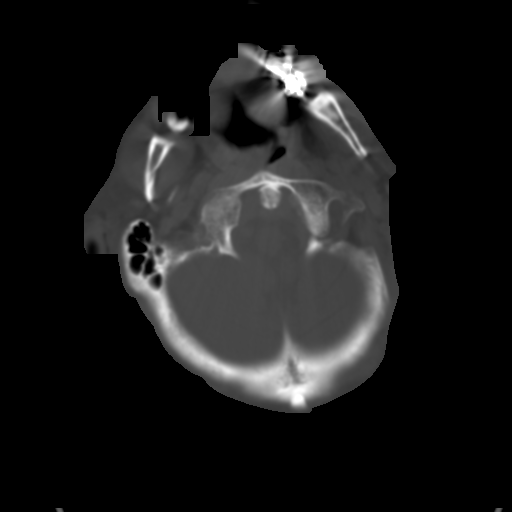
[im 10/37  brain]
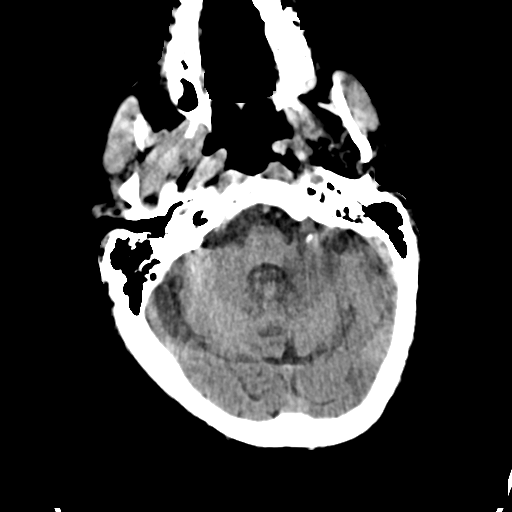
[im 14/37  brain]
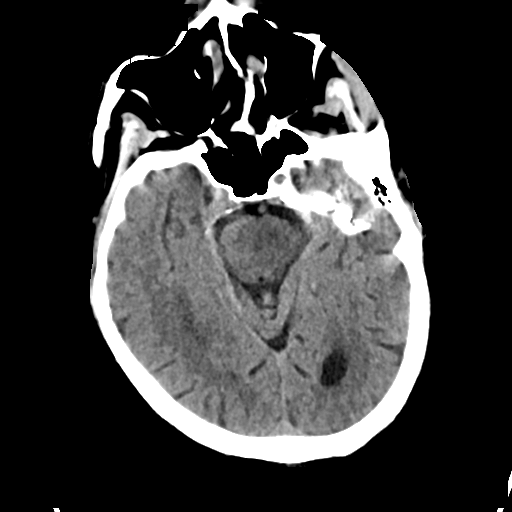
[im 19/37  brain]
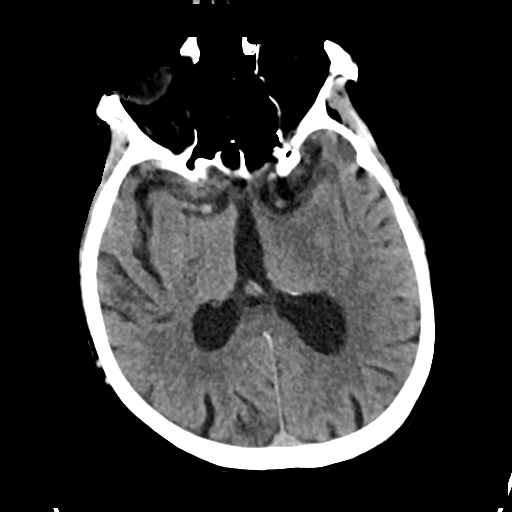
[im 23/37  brain]
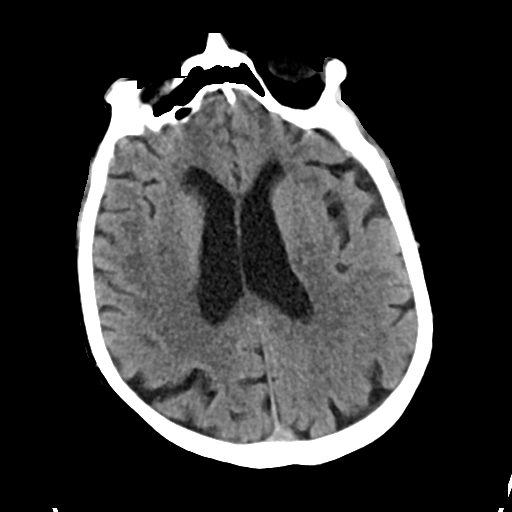
[im 23/37  bone]
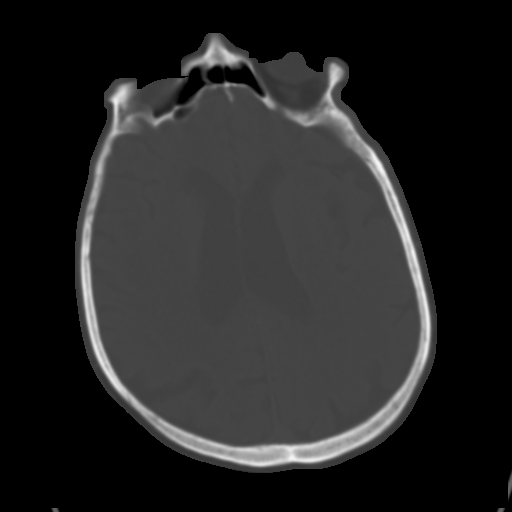
[im 28/37  brain]
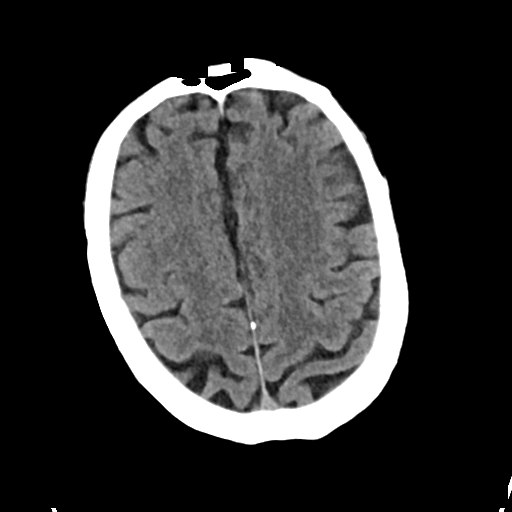
[im 32/37  brain]
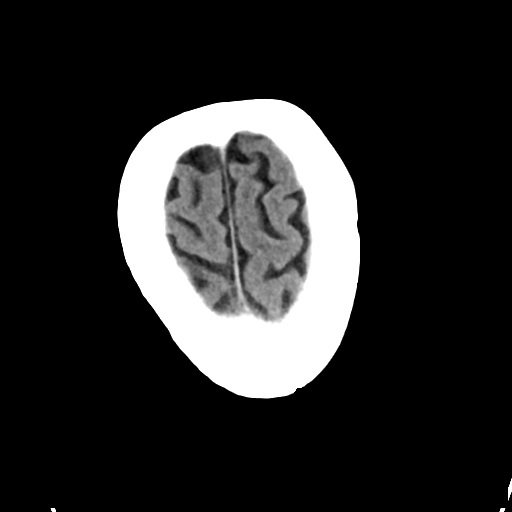

[Series 5: head bone · axial · 0.42mm/px · z∈[+894,+956]mm · 4 of 92 slices shown]
[im 10/92  bone]
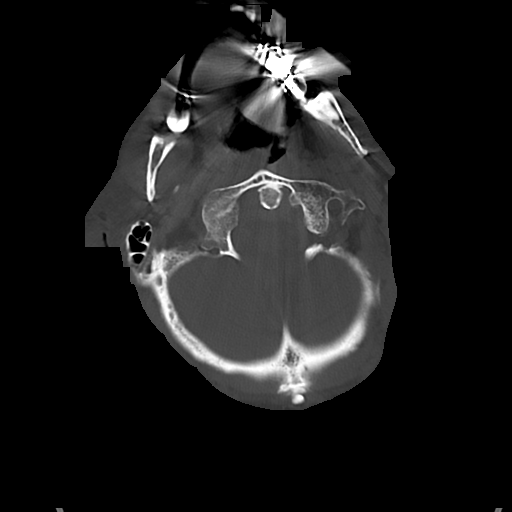
[im 19/92  bone]
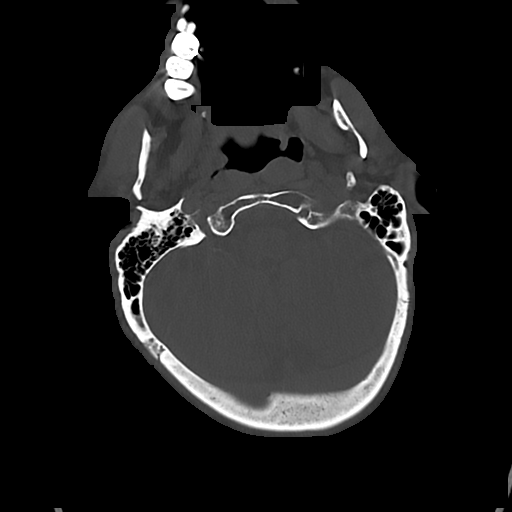
[im 28/92  bone]
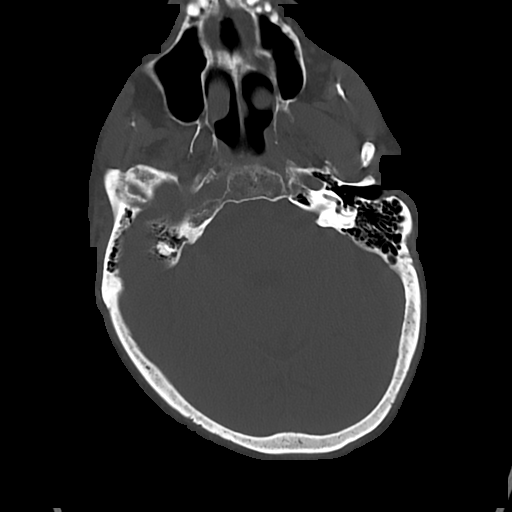
[im 41/92  bone]
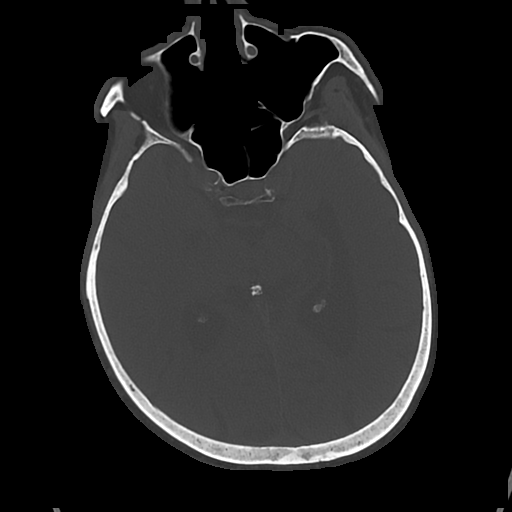

[Series 6: cor soft · coronal · 0.40mm/px · 3 of 72 slices shown]
[im 28/72  brain]
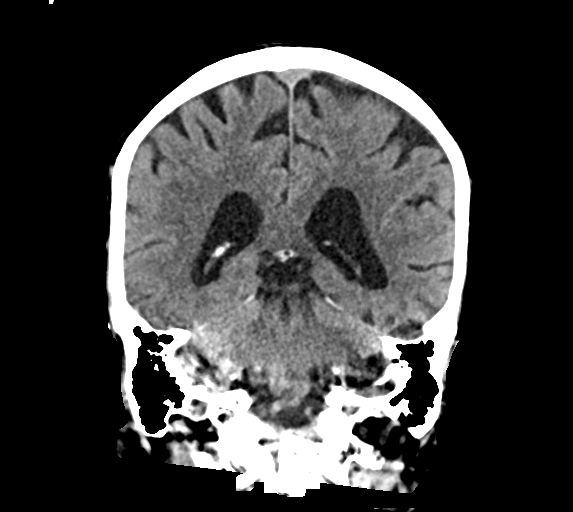
[im 33/72  brain]
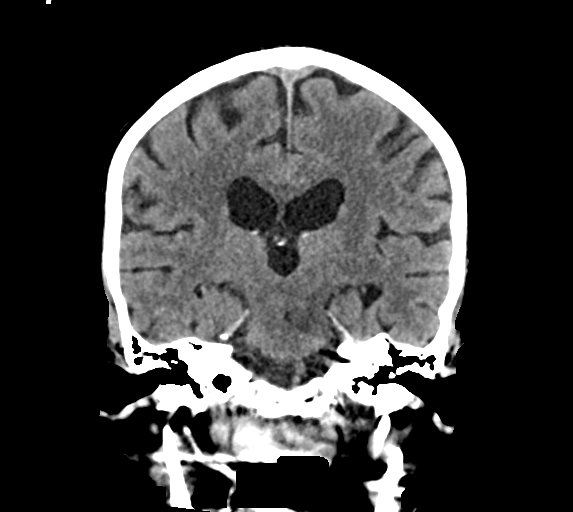
[im 39/72  brain]
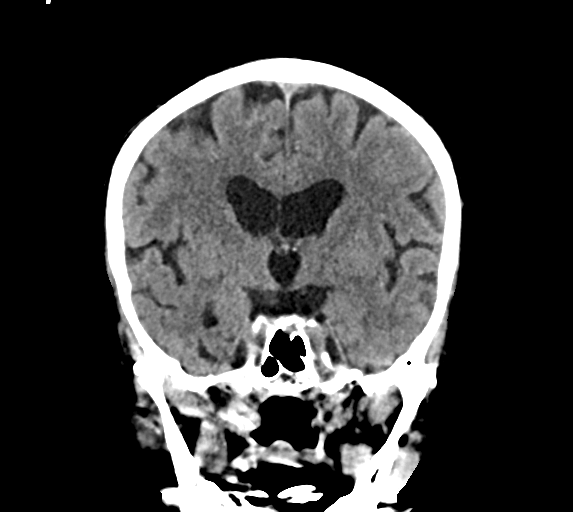

[Series 7: sag soft · sagittal · 0.41mm/px · 3 of 59 slices shown]
[im 20/59  brain]
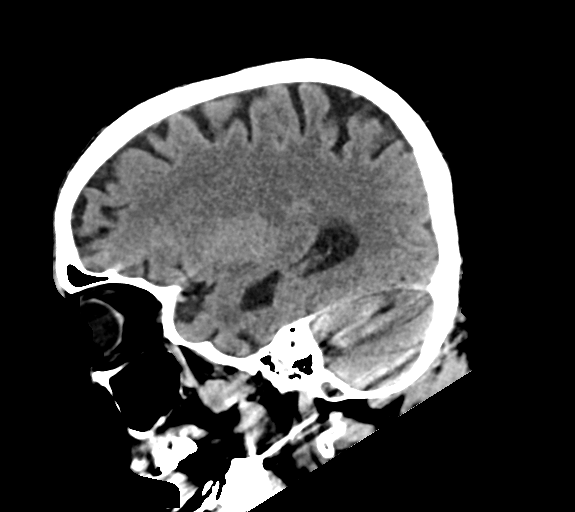
[im 30/59  brain]
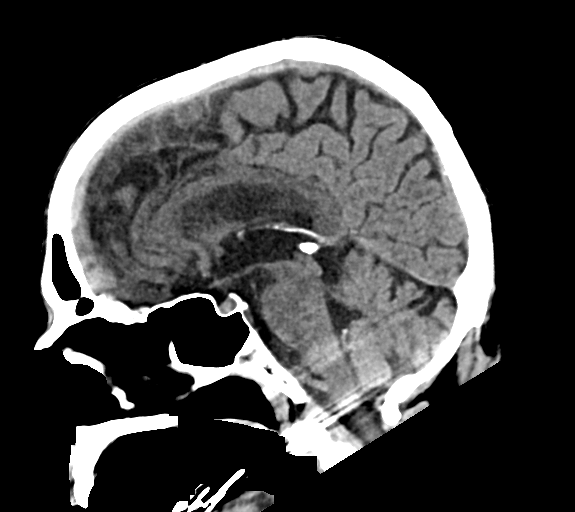
[im 39/59  brain]
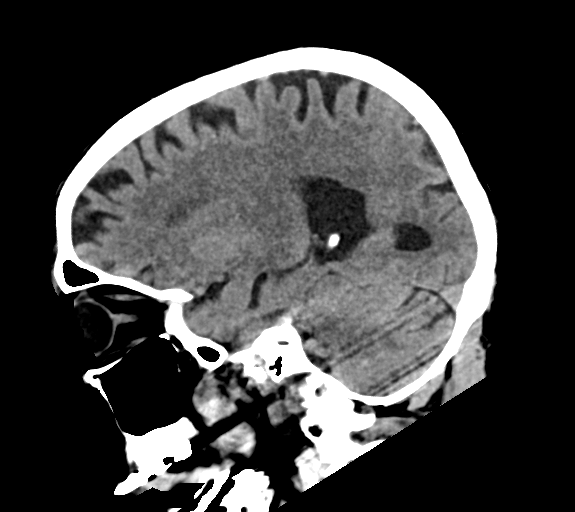

[17 of 47 positions shown; findings below may reference images not displayed]

FINDINGS: Brain: No evidence of acute infarction, hemorrhage, extra-axial
collection, ventriculomegaly, or mass effect. Generalized cerebral
atrophy. Periventricular white matter low attenuation likely
secondary to microangiopathy.

Vascular: Cerebrovascular atherosclerotic calcifications are noted.

Skull: Negative for fracture or focal lesion.

Sinuses/Orbits: Visualized portions of the orbits are unremarkable.
Visualized portions of the paranasal sinuses are unremarkable.
Visualized portions of the mastoid air cells are unremarkable.

Other: None.
IMPRESSION: 1. No acute intracranial findings.
2. Chronic small vessel ischemic changes.

## 2021-05-08 IMAGING — DX DG CHEST 1V PORT
1 series · 1 of 1 positions shown · non-contrast
Comparison: [DATE]

CLINICAL DATA: Questionable sepsis.

EXAM:
PORTABLE CHEST 1 VIEW

[chest ap]
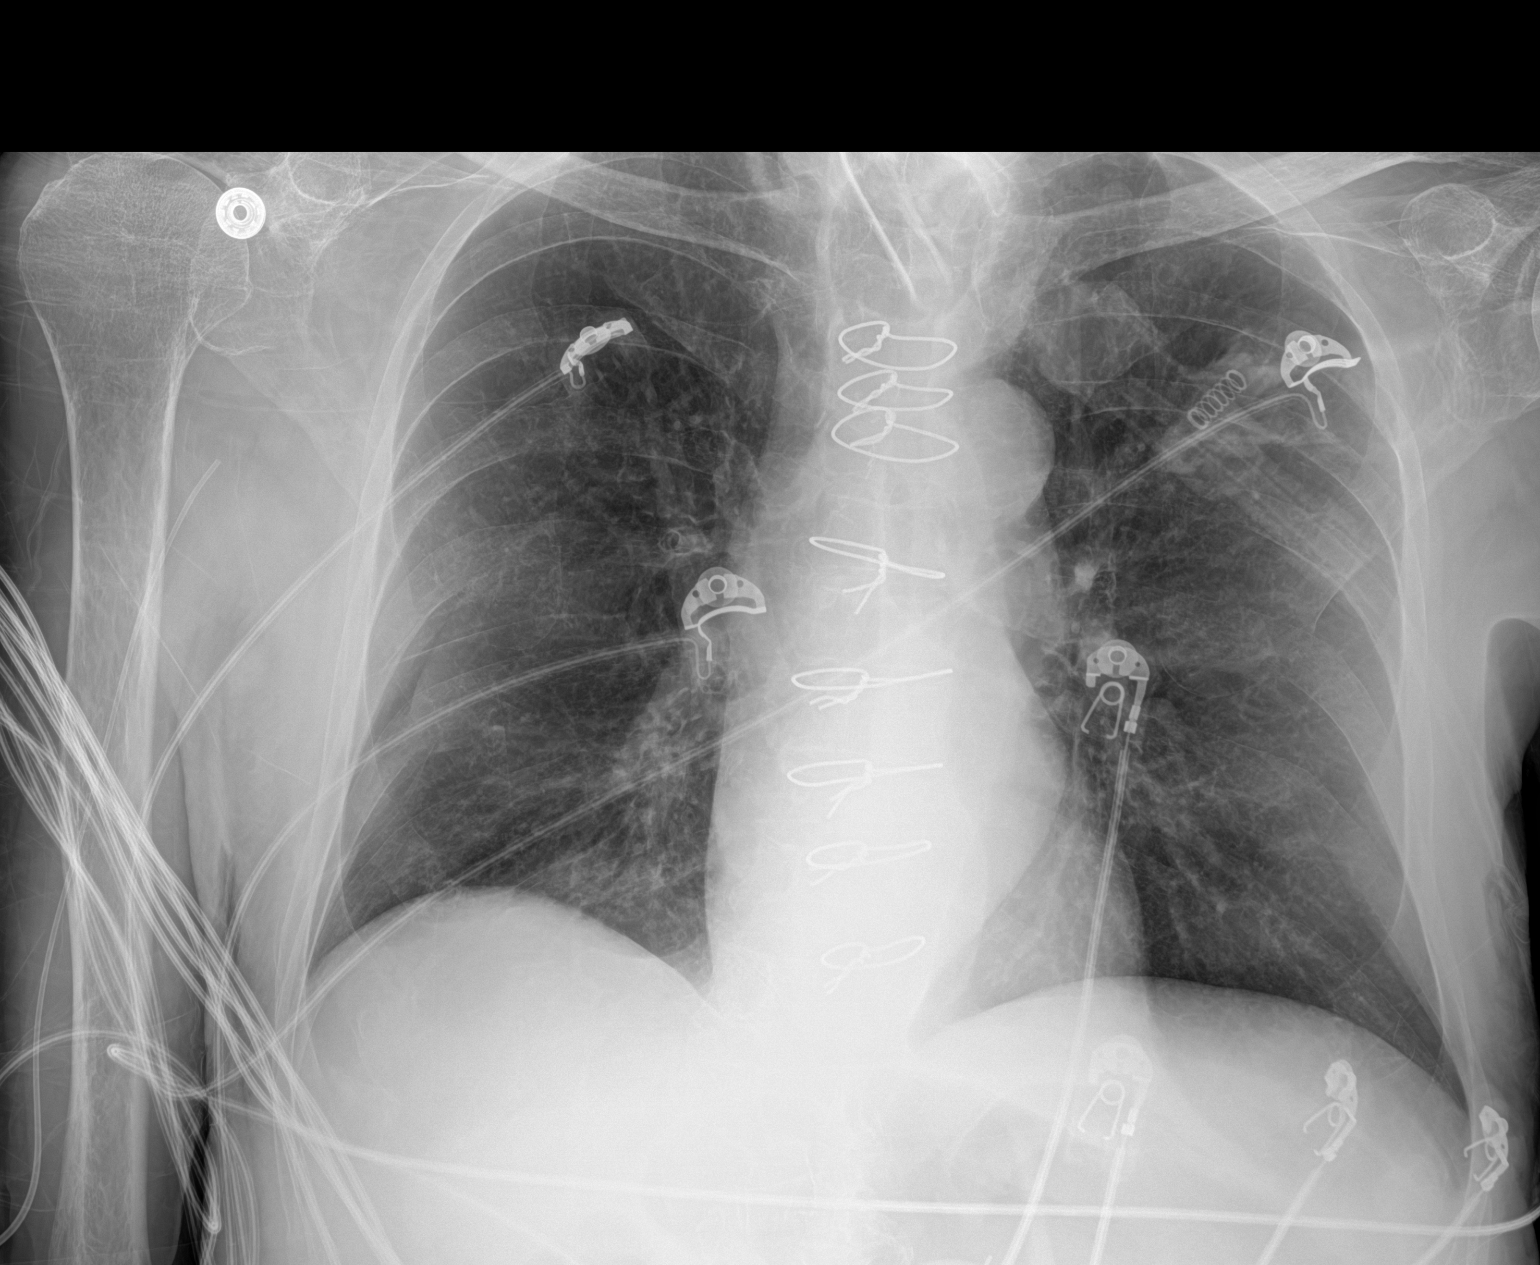

[1 of 1 positions shown; findings below may reference images not displayed]

FINDINGS: Tracheostomy tube stable position. Prior median sternotomy. The
heart size and mediastinal contours are unchanged. Right basilar
opacity similar in location to previously visualized right lower
lobe pneumonia seen on CT [DATE] and similar in appearance to
radiograph [DATE] likely reflecting resolving
inflammation/infection however recurrent aspiration/infection not
excluded. No visible pleural effusion or pneumothorax. The
visualized skeletal structures are unchanged.
IMPRESSION: Right basilar opacity similar in location to previously visualized
right lower lobe pneumonia seen on CT [DATE] and similar in
appearance to radiograph [DATE] likely reflecting resolving
inflammation/infection however recurrent aspiration/infection not
excluded.

## 2021-05-08 IMAGING — CT CT ANGIO CHEST
3 of 7 series · 18 of 36 positions shown · IV contrast (APPLIED)
Comparison: [DATE], [DATE], [DATE].

CLINICAL DATA: Seizure, diaphoresis. Evaluate for pulmonary
embolus.

EXAM:
CT ANGIOGRAPHY CHEST WITH CONTRAST
TECHNIQUE: Multidetector CT imaging of the chest was performed using the
standard protocol during bolus administration of intravenous
contrast. Multiplanar CT image reconstructions and MIPs were
obtained to evaluate the vascular anatomy.

[Series 8: lung · axial · 0.65mm/px · z∈[+589,+699]mm · 3 of 138 slices shown]
[im 28/138  mediastinal]
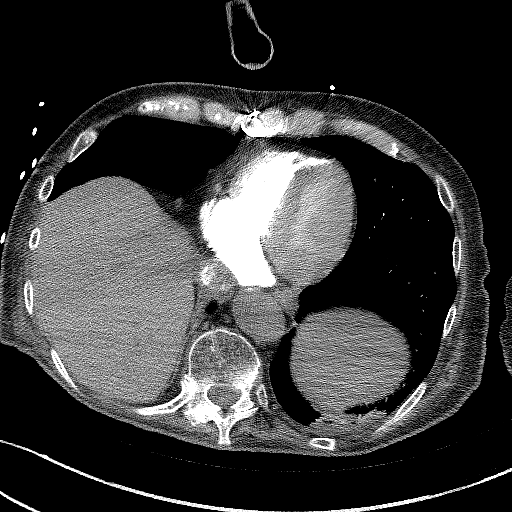
[im 55/138  mediastinal]
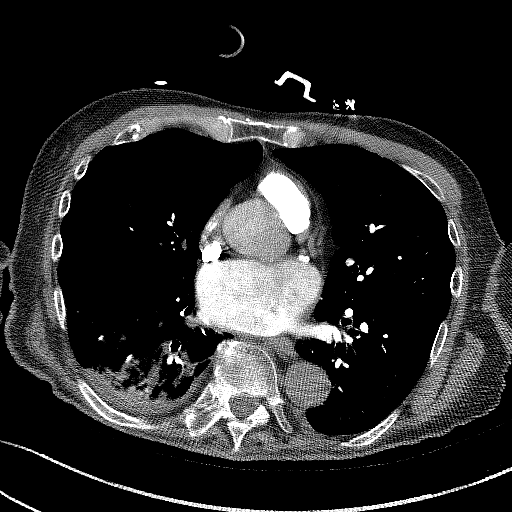
[im 83/138  mediastinal]
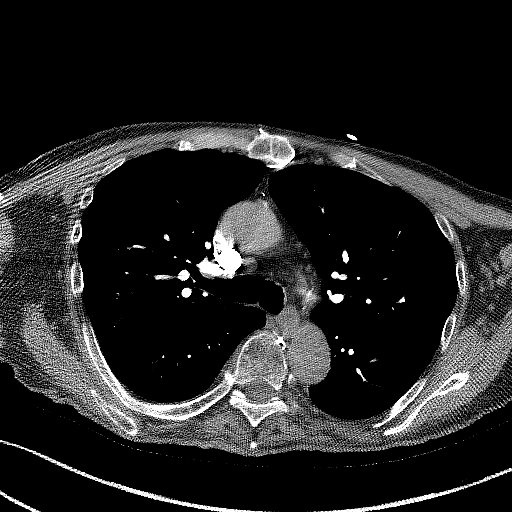

[Series 9: thins · axial · 0.65mm/px · z∈[+553,+790]mm · 14 of 393 slices shown]
[im 27/393  lung]
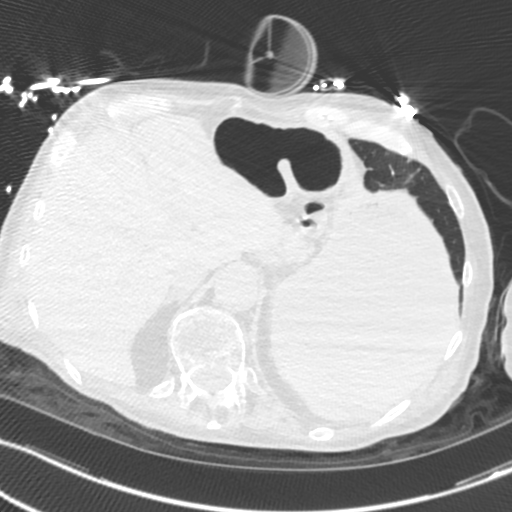
[im 53/393  mediastinal]
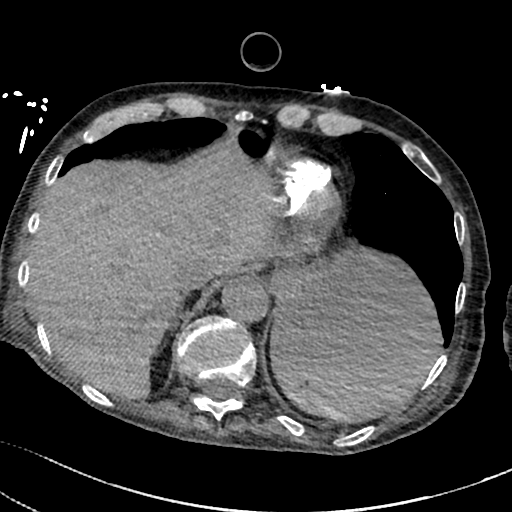
[im 79/393  lung]
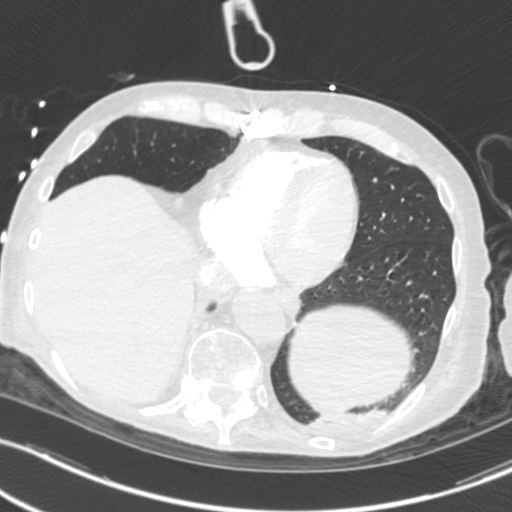
[im 105/393  mediastinal]
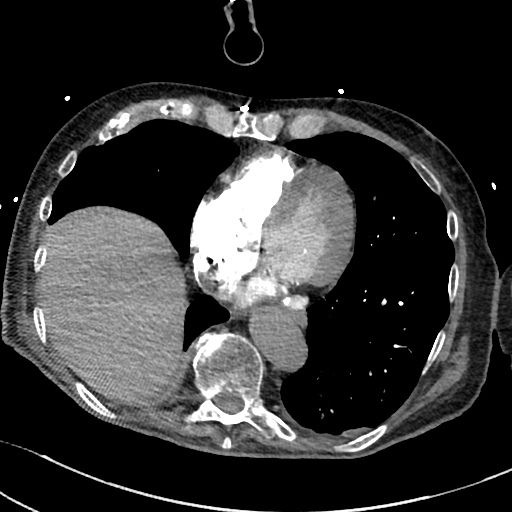
[im 131/393  lung]
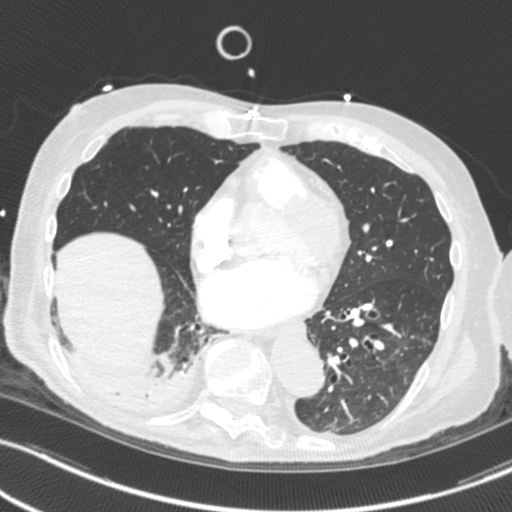
[im 157/393  mediastinal]
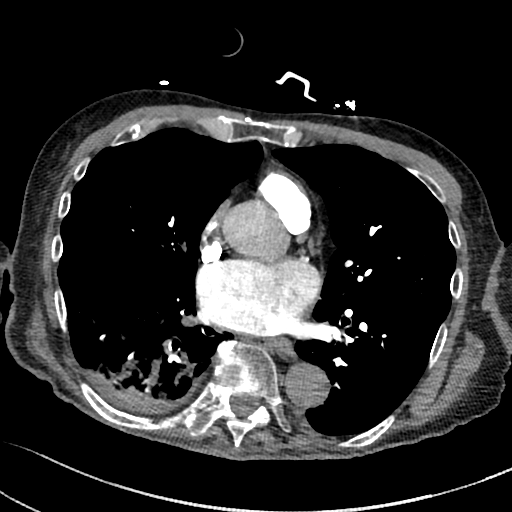
[im 183/393  lung]
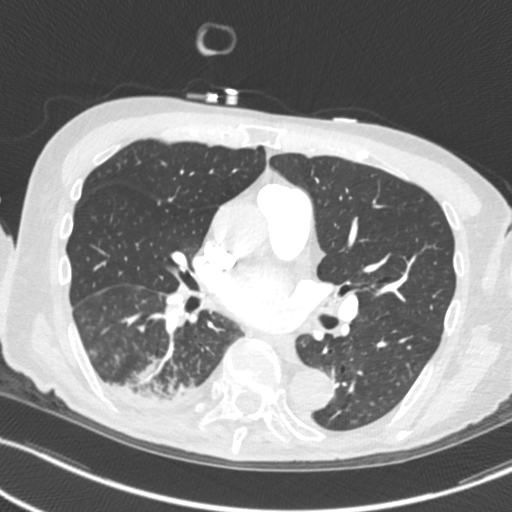
[im 210/393  mediastinal]
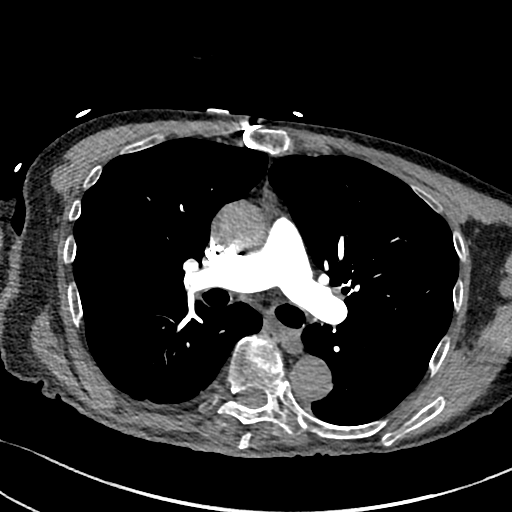
[im 236/393  lung]
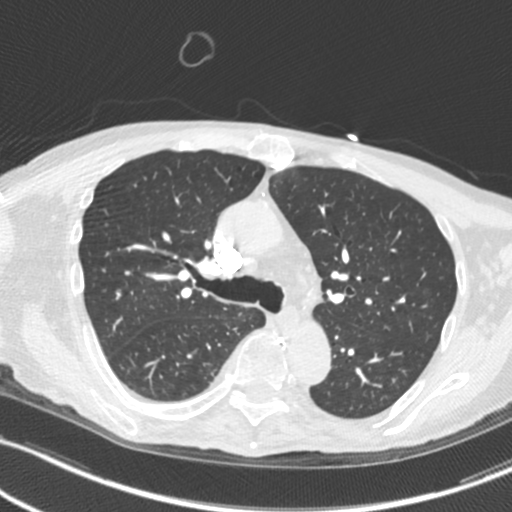
[im 262/393  mediastinal]
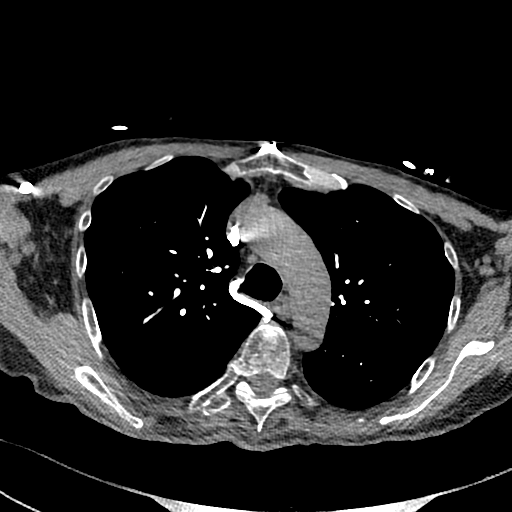
[im 288/393  lung]
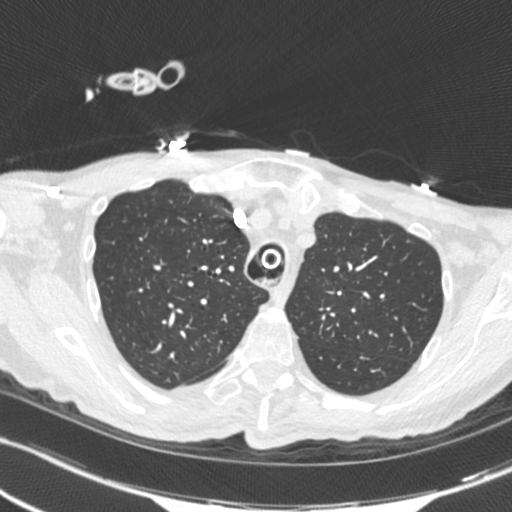
[im 314/393  mediastinal]
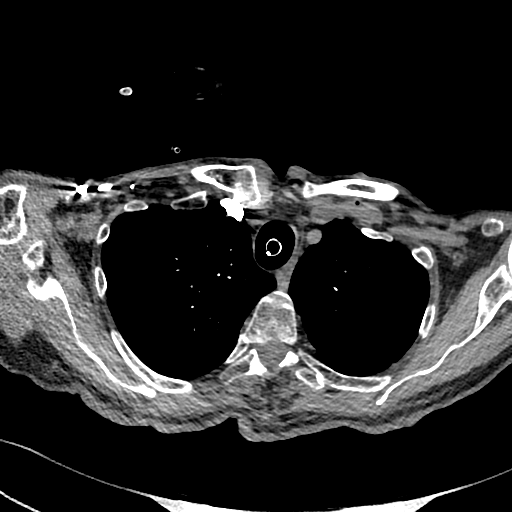
[im 340/393  lung]
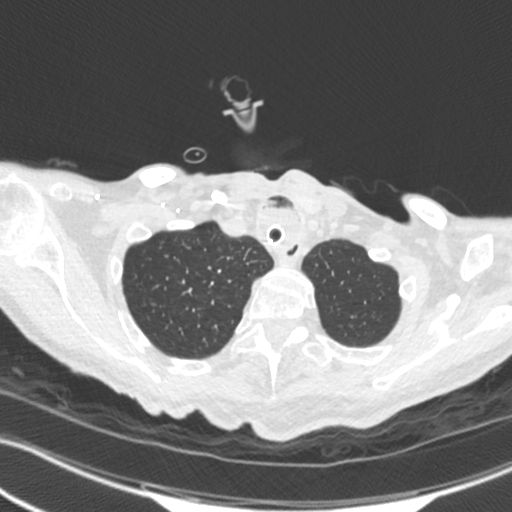
[im 366/393  mediastinal]
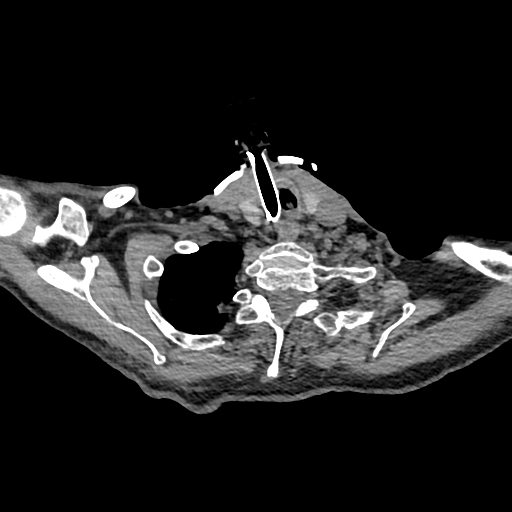

[Series 10: cor · coronal · 0.68mm/px · 1 of 119 slices shown]
[im 60/119  mediastinal]
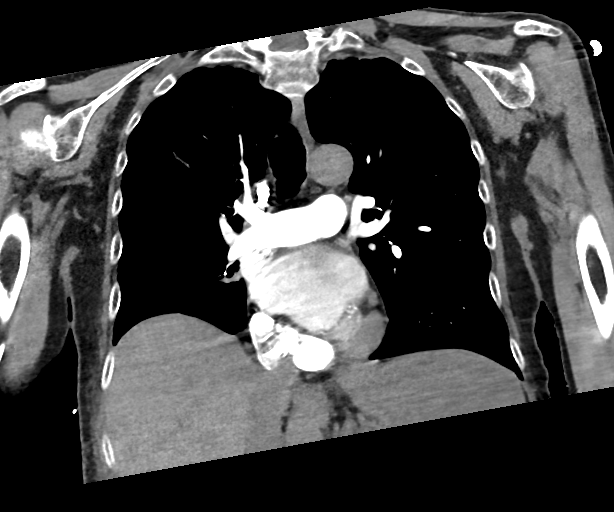

[18 of 36 positions shown; findings below may reference images not displayed]

RADIATION DOSE REDUCTION: This exam was performed according to the
departmental dose-optimization program which includes automated
exposure control, adjustment of the mA and/or kV according to
patient size and/or use of iterative reconstruction technique.

CONTRAST:  75mL OMNIPAQUE IOHEXOL 350 MG/ML SOLN
FINDINGS: Cardiovascular: Negative for pulmonary embolus. Atherosclerotic
calcification of the aorta, aortic valve and coronary arteries.
Heart size normal. Left ventricle appears hypertrophied. No
pericardial effusion.

Mediastinum/Nodes: No pathologically enlarged mediastinal, hilar or
axillary lymph nodes. Esophagus is grossly unremarkable.

Lungs/Pleura: Tracheostomy is in place. Debris is seen in the lower
trachea. Biapical pleuroparenchymal scarring. Spiculated left lower
lobe nodule has tags to the adjacent pleura, measuring 8 x 12 mm
(8/96) and is stable from [DATE] but new from [DATE].
Peribronchovascular ground-glass nodularity and nodular
consolidation in the right lower lobe. Minimal peribronchovascular
ground-glass nodularity in the left lower lobe. Dependent volume
loss in the lower lobes bilaterally. Trace bilateral pleural
effusions.

Upper Abdomen: Visualized portions of the liver, adrenal glands,
right kidney, spleen, pancreas, stomach and bowel are grossly
unremarkable.

Musculoskeletal: Degenerative changes in the spine. Old
thoracolumbar compression deformities. No worrisome lytic or
sclerotic lesions.

Review of the MIP images confirms the above findings.
IMPRESSION: 1. Negative for pulmonary embolus.
2. Spiculated left lower lobe nodule, stable from [DATE] but new
from [DATE]. Finding is worrisome for primary bronchogenic
carcinoma. Outpatient consultation with pulmonary medicine or
thoracic surgery is recommended.
3. Bilateral lower lobe airspace opacification, right much greater
than left, worrisome for chronic/repeated aspiration. Pneumonia is
not excluded.
4. Trace bilateral pleural effusions.
5. Aortic atherosclerosis ([CK]-[CK]). Coronary artery
calcification.

## 2021-05-08 MED ORDER — ENOXAPARIN SODIUM 40 MG/0.4ML IJ SOSY
40.0000 mg | PREFILLED_SYRINGE | INTRAMUSCULAR | Status: DC
  Administered 2021-05-08 – 2021-05-09 (×2): 40 mg via SUBCUTANEOUS
  Filled 2021-05-08 (×2): qty 0.4

## 2021-05-08 MED ORDER — GUAIFENESIN 100 MG/5ML PO LIQD
400.0000 mg | Freq: Three times a day (TID) | ORAL | Status: DC
  Administered 2021-05-08 – 2021-05-10 (×5): 400 mg
  Filled 2021-05-08 (×2): qty 20
  Filled 2021-05-08 (×2): qty 5
  Filled 2021-05-08: qty 20
  Filled 2021-05-08: qty 5

## 2021-05-08 MED ORDER — LACTATED RINGERS IV BOLUS (SEPSIS)
500.0000 mL | Freq: Once | INTRAVENOUS | Status: AC
  Administered 2021-05-08: 500 mL via INTRAVENOUS

## 2021-05-08 MED ORDER — LORATADINE 10 MG PO TABS
10.0000 mg | ORAL_TABLET | Freq: Every day | ORAL | Status: DC
Start: 2021-05-09 — End: 2021-05-10
  Administered 2021-05-09 – 2021-05-10 (×2): 10 mg via ORAL
  Filled 2021-05-08 (×2): qty 1

## 2021-05-08 MED ORDER — ALBUTEROL SULFATE (2.5 MG/3ML) 0.083% IN NEBU
2.5000 mg | INHALATION_SOLUTION | RESPIRATORY_TRACT | Status: DC | PRN
Start: 2021-05-08 — End: 2021-05-10

## 2021-05-08 MED ORDER — PANTOPRAZOLE 2 MG/ML SUSPENSION
40.0000 mg | Freq: Every day | ORAL | Status: DC
  Administered 2021-05-09 – 2021-05-10 (×2): 40 mg
  Filled 2021-05-08 (×2): qty 20

## 2021-05-08 MED ORDER — CLONAZEPAM 0.5 MG PO TABS
0.5000 mg | ORAL_TABLET | Freq: Two times a day (BID) | ORAL | Status: DC
  Administered 2021-05-08 – 2021-05-10 (×4): 0.5 mg
  Filled 2021-05-08 (×4): qty 1

## 2021-05-08 MED ORDER — SODIUM CHLORIDE 0.9 % IV SOLN
2.0000 g | Freq: Two times a day (BID) | INTRAVENOUS | Status: DC
  Administered 2021-05-09 – 2021-05-10 (×3): 2 g via INTRAVENOUS
  Filled 2021-05-08 (×4): qty 12.5

## 2021-05-08 MED ORDER — ARFORMOTEROL TARTRATE 15 MCG/2ML IN NEBU
15.0000 ug | INHALATION_SOLUTION | Freq: Two times a day (BID) | RESPIRATORY_TRACT | Status: DC
  Administered 2021-05-08 – 2021-05-10 (×4): 15 ug via RESPIRATORY_TRACT
  Filled 2021-05-08 (×5): qty 2

## 2021-05-08 MED ORDER — BUDESONIDE 0.5 MG/2ML IN SUSP
2.0000 mg | Freq: Two times a day (BID) | RESPIRATORY_TRACT | Status: DC
  Administered 2021-05-08 – 2021-05-09 (×2): 2 mg via RESPIRATORY_TRACT
  Filled 2021-05-08 (×3): qty 8

## 2021-05-08 MED ORDER — TAMSULOSIN HCL 0.4 MG PO CAPS
0.4000 mg | ORAL_CAPSULE | Freq: Every day | ORAL | Status: DC
  Filled 2021-05-08 (×2): qty 1

## 2021-05-08 MED ORDER — SODIUM CHLORIDE 0.9 % IV SOLN
2.0000 g | Freq: Once | INTRAVENOUS | Status: AC
  Administered 2021-05-08: 2 g via INTRAVENOUS
  Filled 2021-05-08: qty 12.5

## 2021-05-08 MED ORDER — ONDANSETRON HCL 4 MG PO TABS
4.0000 mg | ORAL_TABLET | Freq: Four times a day (QID) | ORAL | Status: DC | PRN
  Filled 2021-05-08: qty 1

## 2021-05-08 MED ORDER — SODIUM CHLORIDE 0.9% FLUSH
10.0000 mL | Freq: Two times a day (BID) | INTRAVENOUS | Status: DC
  Administered 2021-05-08 – 2021-05-09 (×2): 10 mL
  Administered 2021-05-09: 30 mL
  Administered 2021-05-10: 10 mL

## 2021-05-08 MED ORDER — VANCOMYCIN HCL 1250 MG/250ML IV SOLN
1250.0000 mg | Freq: Once | INTRAVENOUS | Status: DC
  Administered 2021-05-08: 1250 mg via INTRAVENOUS
  Filled 2021-05-08: qty 250

## 2021-05-08 MED ORDER — ONDANSETRON HCL 4 MG/2ML IJ SOLN: 4.0000 mg | Freq: Four times a day (QID) | INTRAMUSCULAR | Status: DC | PRN

## 2021-05-08 MED ORDER — PROPRANOLOL HCL 10 MG PO TABS: 10.0000 mg | ORAL_TABLET | Freq: Every day | ORAL | Status: DC

## 2021-05-08 MED ORDER — ACETAMINOPHEN 325 MG PO TABS
650.0000 mg | ORAL_TABLET | Freq: Three times a day (TID) | ORAL | Status: DC | PRN
  Administered 2021-05-08: 650 mg via ORAL
  Filled 2021-05-08: qty 2

## 2021-05-08 MED ORDER — MIDODRINE HCL 5 MG PO TABS
10.0000 mg | ORAL_TABLET | Freq: Three times a day (TID) | ORAL | Status: DC
  Administered 2021-05-08 – 2021-05-10 (×6): 10 mg
  Filled 2021-05-08 (×6): qty 2

## 2021-05-08 MED ORDER — IPRATROPIUM-ALBUTEROL 0.5-2.5 (3) MG/3ML IN SOLN
RESPIRATORY_TRACT | Status: AC
Start: 2021-05-08 — End: 2021-05-08
  Administered 2021-05-08: 3 mL
  Filled 2021-05-08: qty 3

## 2021-05-08 MED ORDER — RISAQUAD PO CAPS
1.0000 | ORAL_CAPSULE | Freq: Every day | ORAL | Status: DC
  Administered 2021-05-09 – 2021-05-10 (×2): 1 via ORAL
  Filled 2021-05-08 (×3): qty 1

## 2021-05-08 MED ORDER — ESOMEPRAZOLE MAGNESIUM 40 MG PO PACK: 40.0000 mg | PACK | Freq: Every day | ORAL | Status: DC

## 2021-05-08 MED ORDER — IPRATROPIUM-ALBUTEROL 0.5-2.5 (3) MG/3ML IN SOLN
RESPIRATORY_TRACT | Status: AC
  Filled 2021-05-08: qty 3

## 2021-05-08 MED ORDER — CHLORHEXIDINE GLUCONATE CLOTH 2 % EX PADS
6.0000 | MEDICATED_PAD | Freq: Every day | CUTANEOUS | Status: DC
  Administered 2021-05-08 – 2021-05-09 (×3): 6 via TOPICAL

## 2021-05-08 MED ORDER — LIDOCAINE HCL URETHRAL/MUCOSAL 2 % EX GEL
1.0000 "application " | Freq: Once | CUTANEOUS | Status: AC
  Administered 2021-05-08: 1 via TOPICAL
  Filled 2021-05-08: qty 11

## 2021-05-08 MED ORDER — ALPRAZOLAM 0.5 MG PO TABS
0.5000 mg | ORAL_TABLET | Freq: Three times a day (TID) | ORAL | Status: DC | PRN
  Administered 2021-05-08 – 2021-05-10 (×3): 0.5 mg
  Filled 2021-05-08: qty 2
  Filled 2021-05-08 (×3): qty 1

## 2021-05-08 MED ORDER — HYDROXYZINE HCL 25 MG PO TABS
25.0000 mg | ORAL_TABLET | Freq: Four times a day (QID) | ORAL | Status: DC | PRN
  Administered 2021-05-08 – 2021-05-09 (×3): 25 mg
  Filled 2021-05-08 (×3): qty 1

## 2021-05-08 MED ORDER — FLUTICASONE PROPIONATE 50 MCG/ACT NA SUSP
1.0000 | Freq: Every day | NASAL | Status: DC
  Administered 2021-05-09 (×2): 1 via NASAL
  Filled 2021-05-08: qty 16

## 2021-05-08 MED ORDER — IPRATROPIUM-ALBUTEROL 0.5-2.5 (3) MG/3ML IN SOLN
3.0000 mL | Freq: Once | RESPIRATORY_TRACT | Status: AC
  Administered 2021-05-08: 3 mL via RESPIRATORY_TRACT

## 2021-05-08 MED ORDER — ALBUTEROL SULFATE (2.5 MG/3ML) 0.083% IN NEBU
2.5000 mg | INHALATION_SOLUTION | Freq: Four times a day (QID) | RESPIRATORY_TRACT | Status: DC
  Administered 2021-05-08 – 2021-05-10 (×7): 2.5 mg via RESPIRATORY_TRACT
  Filled 2021-05-08 (×7): qty 3

## 2021-05-08 MED ORDER — POLYETHYLENE GLYCOL 3350 17 G PO PACK
17.0000 g | PACK | ORAL | Status: DC
  Administered 2021-05-09: 17 g
  Filled 2021-05-08 (×2): qty 1

## 2021-05-08 MED ORDER — CHLORHEXIDINE GLUCONATE 0.12% ORAL RINSE (MEDLINE KIT)
15.0000 mL | Freq: Two times a day (BID) | OROMUCOSAL | Status: DC
  Administered 2021-05-08 – 2021-05-10 (×4): 15 mL via OROMUCOSAL

## 2021-05-08 MED ORDER — LINEZOLID 600 MG/300ML IV SOLN
600.0000 mg | Freq: Two times a day (BID) | INTRAVENOUS | Status: DC
  Administered 2021-05-08 – 2021-05-10 (×4): 600 mg via INTRAVENOUS
  Filled 2021-05-08 (×5): qty 300

## 2021-05-08 MED ORDER — REVEFENACIN 175 MCG/3ML IN SOLN
175.0000 ug | Freq: Every day | RESPIRATORY_TRACT | Status: DC
  Administered 2021-05-08 – 2021-05-10 (×2): 175 ug via RESPIRATORY_TRACT
  Filled 2021-05-08 (×2): qty 3

## 2021-05-08 MED ORDER — IPRATROPIUM-ALBUTEROL 0.5-2.5 (3) MG/3ML IN SOLN: 3.0000 mL | Freq: Once | RESPIRATORY_TRACT | Status: AC

## 2021-05-08 MED ORDER — SODIUM CHLORIDE 0.9 % IV BOLUS
1000.0000 mL | Freq: Once | INTRAVENOUS | Status: AC
  Administered 2021-05-08: 1000 mL via INTRAVENOUS

## 2021-05-08 MED ORDER — IPRATROPIUM-ALBUTEROL 0.5-2.5 (3) MG/3ML IN SOLN: 3.0000 mL | RESPIRATORY_TRACT | Status: DC | PRN

## 2021-05-08 MED ORDER — ESCITALOPRAM OXALATE 10 MG PO TABS
10.0000 mg | ORAL_TABLET | Freq: Every day | ORAL | Status: DC
  Administered 2021-05-09 – 2021-05-10 (×2): 10 mg
  Filled 2021-05-08 (×2): qty 1

## 2021-05-08 MED ORDER — PROPRANOLOL HCL 10 MG PO TABS
10.0000 mg | ORAL_TABLET | Freq: Every day | ORAL | Status: DC
  Administered 2021-05-09: 10 mg
  Filled 2021-05-08 (×2): qty 1

## 2021-05-08 MED ORDER — SODIUM CHLORIDE 0.9% FLUSH: 10.0000 mL | INTRAVENOUS | Status: DC | PRN

## 2021-05-08 MED ORDER — IOHEXOL 350 MG/ML SOLN
75.0000 mL | Freq: Once | INTRAVENOUS | Status: AC | PRN
  Administered 2021-05-08: 75 mL via INTRAVENOUS

## 2021-05-08 MED ORDER — ORAL CARE MOUTH RINSE
15.0000 mL | OROMUCOSAL | Status: DC
  Administered 2021-05-09 – 2021-05-10 (×12): 15 mL via OROMUCOSAL

## 2021-05-08 MED ORDER — LEVOTHYROXINE SODIUM 25 MCG PO TABS
25.0000 ug | ORAL_TABLET | Freq: Every day | ORAL | Status: DC
  Administered 2021-05-09 – 2021-05-10 (×2): 25 ug
  Filled 2021-05-08 (×2): qty 1

## 2021-05-08 MED ORDER — CULTURELLE PO CAPS: 1.0000 | ORAL_CAPSULE | Freq: Every day | ORAL | Status: DC

## 2021-05-08 NOTE — Assessment & Plan Note (Signed)
-  Secondary to acute acidosis, normal creatinine level, recheck BMP tomorrow. ?

## 2021-05-08 NOTE — ED Notes (Signed)
RT 3 at [facility] trouble shooting/ changing trach ?

## 2021-05-08 NOTE — Progress Notes (Signed)
Patient transported to CT and back to room 19 without complications.   ?

## 2021-05-08 NOTE — Progress Notes (Signed)
Repeat ABG obtained on ventilator settings of VT: 580, RR: 22: FIO2:40% and PEEP: 5.  Decreased RR to 16 (previous RR that patient was on during last admission).  No further changes at this time.  Patient tolerating well.  Will continue to monitor.  ? ? Latest Reference Range & Units 05/08/21 15:22  ?Sample type  ARTERIAL  ?pH, Arterial 7.35 - 7.45  7.552 (H)  ?pCO2 arterial 32 - 48 mmHg 35.6  ?pO2, Arterial 83 - 108 mmHg 158 (H)  ?TCO2 22 - 32 mmol/L 32  ?Acid-Base Excess 0.0 - 2.0 mmol/L 8.0 (H)  ?Bicarbonate 20.0 - 28.0 mmol/L 31.3 (H)  ?O2 Saturation % 100  ?Patient temperature  98.4 F  ?Collection site  RADIAL, ALLEN'S TEST ACCEPTABLE  ? ?

## 2021-05-08 NOTE — ED Notes (Signed)
Back from CT, tolerated well, no change, VSS, 2nd I VF bolus initiated.  ?

## 2021-05-08 NOTE — Progress Notes (Signed)
Patient arrived by [facility] from [place].  Upon arrival patient was placed on settings that were provided by [facility] however patient was not getting any volumes (volumes were only reading 100s).  Patient also noted to have diminshed breath sounds.  Patient received breathing treatment however ventilator numbers did not improve.  Attempted to lavage patient and RT noted resistance and was only able to obtain a small amount of thick secretions.  Chest xray was obtained.  Second RT came to patient room and attempted to help with trach position.  MD came to patient room to be able to perform bronch and check trach position.  Decision was made to change patient's trach.  Patient's trach was confirmed by MD with bronch.  Patient's ventilator values improved.  RT will continue to monitor.  ?

## 2021-05-08 NOTE — H&P (Signed)
?History and Physical  ? ? ?Austin Davis XNA:[id number] DOB: [birth year] DOA: 05/08/2021 ? ?PCP: Aaron Edelman, MD (Confirm with patient/family/NH records and if not entered, this has to be entered at [facility] point of entry) ?Patient coming from: LTAC ? ?I have personally briefly reviewed patient's old medical records in [place] ? ?Chief Complaint: Unable to talk, patient chronic trach ? ?HPI: Austin Davis is a 81 y.o. 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 ? ?Symptoms started yesterday, LTAC staff noticed patient started to have episodes of eyes staring at sittings, less responsive to nursing staff and decreased extremity movement.  Patient was sent to the ED for evaluation.  Yesterday's ED work-up were benign and patient sent back to LTAC.  But overnight, appears that patient had recurrent similar episodes of staring, and also noticed the patient O2 saturation dropped to 80% and patient was sent back to ED. ? ?ED Course: Afebrile, blood pressure borderline low no significant hypoxia but ABG showed new onset of respiratory acidosis 7.2 6/89/499, leukocytosis 17.6 compared to yesterday 11. ? ?CT chest showed right lower lobe infiltrates and consolidations, and urine appears to to be cloudy.  UA pending.  Patient was given vancomycin and cefepime in the ED. ? ?Review of Systems: Unable to perform, patient trach and unable to talk. ? ?Past Medical History:  ?Diagnosis Date  ? CHF (congestive heart failure) ([place])   ? Coronary artery disease   ? Essential tremor   ? GI bleed 2019  ? hospitalized at [facility] for one week  ? Headache   ? since childhood  ? Low blood sugar   ? since childhood, controlled by diet  ? Mitral regurgitation   ? Mitral valve prolapse   ?  Osteopenia 2021  ? Paroxysmal atrial fibrillation ([facility])   ? ? ?Past Surgical History:  ?Procedure Laterality Date  ? COLONOSCOPY WITH PROPOFOL N/A 02/19/2017  ? Procedure: COLONOSCOPY WITH PROPOFOL;  Surgeon: Wilford Corner, MD;  Location: [facility];  Service: Endoscopy;  Laterality: N/A;  ? IR GASTROSTOMY TUBE MOD SED  10/18/2020  ? IR IVC FILTER PLMT / S&I /IMG GUID/MOD SED  11/01/2020  ? laser eye surgery for retina detachment    ? MITRAL VALVE REPAIR  01/2003  ? monitor  02/05/2006  ? polyp removal    ? TONSILLECTOMY    ? tooth removal    ? as a teenager  ? TRACHEOSTOMY TUBE PLACEMENT N/A 10/26/2020  ? Procedure: TRACHEOSTOMY;  Surgeon: Rozetta Nunnery, MD;  Location: [place];  Service: ENT;  Laterality: N/A;  ? ? ? reports that he has never smoked. He has never used smokeless tobacco. He reports current alcohol use. He reports that he does not use drugs. ? ?Allergies  ?Allergen Reactions  ? Prednisone Other (See Comments)  ?  Makes skin crawl, rapid HR  ? Cortisone Palpitations  ? ? ?Family History  ?Problem Relation Age of Onset  ? Heart disease Mother   ? CAD Mother   ? Migraines Mother   ? Heart failure Father   ? Skin cancer Father   ? Valvular heart disease Father   ?     prolapsed valve  ? Tremor Father   ?     "probably had a bit of tremor in his old age"  ?  Heart failure Maternal Grandmother   ? Pneumonia Maternal Grandfather   ? Heart failure Paternal Grandmother   ? Stroke Paternal Grandfather   ? Asthma Daughter   ? Epilepsy Son   ? Parkinson's disease Neg Hx   ? ? ? ?Prior to Admission medications   ?Medication Sig Start Date End Date Taking? Authorizing Provider  ?acetaminophen (TYLENOL) 650 MG CR tablet Take 650 mg by mouth every 8 (eight) hours as needed for pain. Via tube   Yes [provider]  ?ALPRAZolam Duanne Moron) 0.5 MG tablet Place 1 tablet (0.5 mg total) into feeding tube every 8 (eight) hours as needed for anxiety. 05/01/21  Yes Thurnell Lose, MD  ?cephALEXin (KEFLEX) 250  MG/5ML suspension Take 10 mLs (500 mg total) by mouth 3 (three) times daily for 10 days. ?Patient taking differently: Take 500 mg by mouth every 8 (eight) hours. For 10 days 04/29/21 05/09/21 Yes Cardama, Grayce Sessions, MD  ?Cholecalciferol (VITAMIN D3 SUPER STRENGTH) 50 MCG (2000 UT) TABS Give 2,000 Units by tube daily.   Yes [provider]  ?clonazePAM (KLONOPIN) 0.5 MG tablet Place 1 tablet (0.5 mg total) into feeding tube 2 (two) times daily. 05/01/21  Yes Thurnell Lose, MD  ?escitalopram (LEXAPRO) 10 MG tablet Place 10 mg into feeding tube daily.   Yes [provider]  ?esomeprazole (NEXIUM) 40 MG packet Take 40 mg by mouth daily. Per G tube   Yes [provider]  ?fexofenadine (ALLEGRA) 180 MG tablet Place 180 mg into feeding tube daily.   Yes [provider]  ?fluticasone (FLONASE) 50 MCG/ACT nasal spray Place 1 spray into both nostrils in the morning and at bedtime.   Yes [provider]  ?folic acid (FOLVITE) 1 MG tablet Take 1 tablet (1 mg total) by mouth daily. ?Patient taking differently: Place 1 mg into feeding tube daily. 09/29/20  Yes [name], MD  ?furosemide (LASIX) 20 MG tablet Place 1 tablet (20 mg total) into feeding tube daily as needed for fluid or edema. 04/16/21  Yes [name], DO  ?guaiFENesin (ROBITUSSIN) 100 MG/5ML liquid Place 400 mg into feeding tube 3 (three) times daily.   Yes [provider]  ?hydrOXYzine (ATARAX) 25 MG tablet Place 25 mg into feeding tube every 6 (six) hours as needed for anxiety.   Yes [provider]  ?Lactobacillus Rhamnosus, GG, (CULTURELLE) CAPS Give 1 capsule by tube daily.   Yes [provider]  ?lactulose (CHRONULAC) 10 GM/15ML solution Place 20 g into feeding tube 2 (two) times daily as needed for mild constipation.   Yes [provider]  ?levalbuterol (XOPENEX) 1.25 MG/3ML nebulizer solution Take 1.25 mg by nebulization every 6 (six) hours. For 30 days  starting on 03-23-21   Yes [provider]  ?levETIRAcetam (KEPPRA) 100 MG/ML solution Place 7.5 mLs (750 mg total) into feeding tube 2 (two) times daily. ?Patient taking differently: Place 500 mg into feeding tube 2 (two) times daily. 05/07/21 07/06/21 Yes Trifan, Carola Rhine, MD  ?levothyroxine (SYNTHROID) 25 MCG tablet Place 25 mcg into feeding tube daily before breakfast.   Yes [provider]  ?midodrine (PROAMATINE) 10 MG tablet Place 1 tablet (10 mg total) into feeding tube 3 (three) times daily with meals. 05/01/21  Yes Thurnell Lose, MD  ?Omega-3 Fatty Acids (SUPER OMEGA 3 EPA/DHA) 1000 MG CAPS Give 1,000 mg by tube daily.   Yes [provider]  ?Pollen Extracts (PROSTAT PO) Give 30 mLs by tube  in the morning and at bedtime.   Yes [provider]  ?polyethylene glycol powder (GLYCOLAX/MIRALAX) 17 GM/SCOOP powder Place 17 g into feeding tube every other day.   Yes [provider]  ?propranolol (INDERAL) 10 MG tablet Place 1 tablet (10 mg total) into feeding tube daily as needed (for tremors in AM. do not administer if heart rate is below 60 or systolic blood pressure is below 90.). ?Patient taking differently: Place 10 mg into feeding tube daily. 05/01/21  Yes Thurnell Lose, MD  ?sodium chloride (OCEAN) 0.65 % SOLN nasal spray Place 1 spray into both nostrils every 4 (four) hours as needed for congestion.   Yes [provider]  ?tamsulosin (FLOMAX) 0.4 MG CAPS capsule Take 0.4 mg by mouth at bedtime.   Yes [provider]  ?vitamin C (ASCORBIC ACID) 500 MG tablet Place 500 mg into feeding tube daily.   Yes [provider]  ?zinc gluconate 50 MG tablet Place 50 mg into feeding tube 2 (two) times daily.   Yes [provider]  ?ipratropium-albuterol (DUONEB) 0.5-2.5 (3) MG/3ML SOLN Take 3 mLs by nebulization every 6 (six) hours as needed (shortness of breath). ?Patient not taking: Reported on 05/08/2021 05/01/21   Thurnell Lose, MD  ?Multiple Vitamin (MULTIVITAMIN WITH MINERALS) TABS tablet Take 1 tablet by mouth daily. ?Patient not taking: Reported on 05/08/2021 09/29/20   [name], MD  ? ? ?Physical Exam: ?Vitals:  ? 05/08/21 13

## 2021-05-08 NOTE — ED Notes (Signed)
Pt requesting something for pain and anxiety.  Also requesting a breathing tx.  Repiratory notified.  ?

## 2021-05-08 NOTE — ED Notes (Signed)
RT x3 at [facility], PCCM at [facility]. No change. Pt stable. VSS. NAD.  ?

## 2021-05-08 NOTE — Assessment & Plan Note (Addendum)
-  As above, most recent urine culture showed Pseudomonas and VRE, antibiotics coverage with Zyvox and cefepime. ?-Urinalysis done and showed turbid appearance with amber-colored urine, large leukocytes, negative nitrites, many bacteria, greater than 50 WBCs with urine culture pending ?

## 2021-05-08 NOTE — Consult Note (Signed)
? ?NAME:  Austin Davis, MRN:  [id number], DOB:  Jan 06, 1941, LOS: 0 ?ADMISSION DATE:  05/08/2021, CONSULTATION DATE:  4/19 ?REFERRING MD:  Dr. Doren Custard, CHIEF COMPLAINT:  possible seizure; chronic trach vent dependent  ? 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 ? ?Patient is trached on long term mechanical ventilation. Resides at [facility]. 6XLT trach in place. Patient was recently admitted to [facility] on 4/11 with seizure activity.  Patient was loaded with Keppra. EEG without seizure activity. CT head/MRI no acute abnormality. Patient was discharged back to [facility] 4/12 on 500 keppra BID. ? ?Patient arrived to [facility] ED on 4/19 with similar seizure activity. He was having a 6 minute episode of staring off into space. Patient is now alert and oriented. Labs/CT head pending. Was having difficulty ventilating and concerned about trach position. ABG pH 7.26, co2 89, po2 499, bicarb 40. CXR showing RLL pneumonia which is similar in appearance from previous CXR on April 30, 2021. PCCM consulted for further evaluation of hypercarbic respiratory failure and also concern about tracheostomy placement/trach dislodgement.  ? ?Of note the patient's trach was initially partially out of the stoma prior to RT repositioning  ? ?Pertinent  Medical History  ? ?Past Medical History:  ?Diagnosis Date  ? CHF (congestive heart failure) ([place])   ? Coronary artery disease   ? Essential tremor   ? GI bleed 2019  ? hospitalized at [facility] for one week  ? Headache   ? since childhood  ? Low blood sugar   ? since childhood, controlled by diet  ? Mitral regurgitation   ? Mitral valve prolapse   ? Osteopenia 2021  ? Paroxysmal atrial fibrillation (HCC)   ? ? ? ?Significant Hospital Events: ?Including procedures, antibiotic start and stop  dates in addition to other pertinent events   ?4/19: admitted to [facility] ED with possible seizure ? ?Interim History / Subjective:  ?Now getting full return volumes on vent  ? ?Objective   ?Blood pressure 102/70, pulse 68, resp. rate (Abnormal) 22, height 5\' 10"  (1.778 m), weight 56.7 kg, SpO2 92 %. ?   ?Vent Mode: PCV ?FiO2 (%):  [40 %] 40 % ?Set Rate:  [16 bmp] 16 bmp ?PEEP:  [5 cmH20] 5 cmH20 ?Plateau Pressure:  [12 cmH20-17 cmH20] 12 cmH20  ? ?Intake/Output Summary (Last 24 hours) at 05/08/2021 1319 ?Last data filed at 05/08/2021 1251 ?Gross per 24 hour  ?Intake 500 ml  ?Output no documentation  ?Net 500 ml  ? ?Filed Weights  ? 05/08/21 1104  ?Weight: 56.7 kg  ? ? ?Examination: ?General: this is a cachectic 81 year old male who is on full vent support   ?HENT: temporals wasting. Sclera not icteric. MM dry. The size 6 distal XLT is mid-line. There is some bloody drainage at distal portion of the trach  ?Lungs: crackles bases. Currently on full support. Getting full expiratory volumes now  ?Cardiovascular: RRR ?Abdomen: soft  ?Extremities: warm  ?Neuro: eyes open. Not following commands  ? ? ?Resolved Hospital Problem list   ? ? ?Assessment & Plan:  ?Principal Problem: ?  Acute metabolic encephalopathy ?Active Problems: ?  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[place]) ?  Acute on chronic respiratory failure with hypoxia and hypercapnia (HCC) ?  Essential tremor ?  Hypotension ?  Failure to thrive in adult ?  AF (paroxysmal atrial fibrillation) ([place]) ?  Protein-calorie malnutrition, severe ?  Obstructive sleep apnea ?  Chronic obstructive pulmonary disease (HCC) ?  Chronic heart failure with preserved ejection fraction ([place]) ?  Aspiration pneumonia ([place]) ?  Ventilator dependent ([place]) ?  Seizure ([place]) ? ? ? ?Acute Hypercarbic Respiratory secondary tracheostomy malpositioning and airway obstruction  ? ? ?-Initially The trach was dislodged and abutted the  posterior wall of the trachea This was corrected prior to my arrival. I did  evaluate via bronchoscopy and due to positioning I elected to replace the trach w/ a new one. I evaluated this via bronchoscopy afterwards and the tip of the trach was mid-line and about 3 cm above the [place] & vent assessment showing full return volumes.  ? ?Plan ?Cont full vent support.  ?Reasonable to review ABG now that getting full tidal volumes looks like baseline CO2 in 50s.  ?Ensure that the trach ties are fixed adequately (need to be no more than two fingers width) ? ? ?All the rest of care per EDP ? ? ? ? ?Best Practice (right click and "Reselect all SmartList Selections" daily)  ? ?Per primary  ? ?Labs   ?CBC: ?Recent Labs  ?Lab 05/07/21 ?2018 05/08/21 ?1108 05/08/21 ?1145  ?WBC 11.5*  --  17.6*  ?NEUTROABS  --   --  16.6*  ?HGB 9.5* 14.3 13.0  ?HCT 31.6* 42.0 44.0  ?MCV 93.8  --  96.9  ?PLT 322  --  436*  ? ? ?Basic Metabolic Panel: ?Recent Labs  ?Lab 05/07/21 ?2018 05/08/21 ?1108 05/08/21 ?1145  ?NA 142 139 143  ?K 3.7 5.3* 5.5*  ?CL 111  --  99  ?CO2 26  --  38*  ?GLUCOSE 112*  --  186*  ?BUN 17  --  21  ?CREATININE 0.36*  --  0.67  ?CALCIUM 7.2*  --  10.1  ?MG  --   --  2.4  ? ?GFR: ?Estimated Creatinine Clearance: 59.1 mL/min (by C-G formula based on SCr of 0.67 mg/dL). ?Recent Labs  ?Lab 05/07/21 ?2018 05/08/21 ?1145  ?WBC 11.5* 17.6*  ?LATICACIDVEN  --  2.0*  ? ? ?Liver Function Tests: ?Recent Labs  ?Lab 05/08/21 ?1145  ?AST 24  ?ALT 31  ?ALKPHOS 138*  ?BILITOT 0.3  ?PROT 8.3*  ?ALBUMIN 3.7  ? ?No results for input(s): LIPASE, AMYLASE in the last 168 hours. ?No results for input(s): AMMONIA in the last 168 hours. ? ?ABG ?   ?Component Value Date/Time  ? PHART 7.266 (L) 05/08/2021 1108  ? PCO2ART 89.0 (HH) 05/08/2021 1108  ? PO2ART 499 (H) 05/08/2021 1108  ? HCO3 40.6 (H) 05/08/2021 1108  ? TCO2 43 (H) 05/08/2021 1108  ? O2SAT 100 05/08/2021 1108  ?  ? ?Coagulation Profile: ?Recent Labs  ?Lab 05/08/21 ?1145  ?INR 1.0  ? ? ?Cardiac Enzymes: ?No results for input(s): CKTOTAL, CKMB, CKMBINDEX,  TROPONINI in the last 168 hours. ? ?HbA1C: ?Hgb A1c MFr Bld  ?Date/Time Value Ref Range Status  ?04/11/2021 09:12 PM 5.5 4.8 - 5.6 % Final  ?  Comment:  ?  (NOTE) ?Pre diabetes:          5.7%-6.4% ? ?Diabetes:              >6.4% ? ?Glycemic control for   <7.0% ?adults with diabetes ?  ?10/02/2020 04:46 AM 6.1 (H) 4.8 - 5.6 % Final  ?  Comment:  ?  (NOTE) ?Pre diabetes:  5.7%-6.4% ? ?Diabetes:              >6.4% ? ?Glycemic control for   <7.0% ?adults with diabetes ?  ? ? ?CBG: ?Recent Labs  ?Lab 05/07/21 ?2133  ?GLUCAP 100*  ? ? ?Review of Systems:   ?Not able  ? ?Past Medical History:  ?He,  has a past medical history of CHF (congestive heart failure) ([place]), Coronary artery disease, Essential tremor, GI bleed (2019), Headache, Low blood sugar, Mitral regurgitation, Mitral valve prolapse, Osteopenia (2021), and Paroxysmal atrial fibrillation ([place]).  ? ?Surgical History:  ? ?Past Surgical History:  ?Procedure Laterality Date  ? COLONOSCOPY WITH PROPOFOL N/A 02/19/2017  ? Procedure: COLONOSCOPY WITH PROPOFOL;  Surgeon: Wilford Corner, MD;  Location: [facility];  Service: Endoscopy;  Laterality: N/A;  ? IR GASTROSTOMY TUBE MOD SED  10/18/2020  ? IR IVC FILTER PLMT / S&I /IMG GUID/MOD SED  11/01/2020  ? laser eye surgery for retina detachment    ? MITRAL VALVE REPAIR  01/2003  ? monitor  02/05/2006  ? polyp removal    ? TONSILLECTOMY    ? tooth removal    ? as a teenager  ? TRACHEOSTOMY TUBE PLACEMENT N/A 10/26/2020  ? Procedure: TRACHEOSTOMY;  Surgeon: Rozetta Nunnery, MD;  Location: [place];  Service: ENT;  Laterality: N/A;  ?  ? ?Social History:  ? reports that he has never smoked. He has never used smokeless tobacco. He reports current alcohol use. He reports that he does not use drugs.  ? ?Family History:  ?His family history includes Asthma in his daughter; CAD in his mother; Epilepsy in his son; Heart disease in his mother; Heart failure in his father, maternal grandmother, and paternal grandmother;  Migraines in his mother; Pneumonia in his maternal grandfather; Skin cancer in his father; Stroke in his paternal grandfather; Tremor in his father; Valvular heart disease in his father. There is no history of Parkinson'

## 2021-05-08 NOTE — ED Triage Notes (Addendum)
BIB [facility] from [facility], returns for recurrent sz, here yesterday for the same, d/c'd at midnight, returns for recurrent sz this am, lasted 6-7 minutes, last baseline norm was 10 days ago, started keppra on 4/12, 1 midline and 1 NSL, arrives poorly ventilating on vent 80% - 40/5, RT and Dr. Doren Custard present on arrival. No active sz, no sz activity for [facility]. Pt diaphoretic. CBG 207. ?

## 2021-05-08 NOTE — ED Notes (Signed)
Report attempted.  Informed pt may need reassignment to different ICU unit.  Waiting for confirmation ?

## 2021-05-08 NOTE — Progress Notes (Signed)
Pt transported by RN and RT from ED19 to 8Q33 w/ no complications ?

## 2021-05-08 NOTE — ED Provider Notes (Signed)
?[place] ?Provider Note ? ? ?CSN: [id number] ?Arrival date & time: 05/08/21  1035 ? ?  ? ?History ? ?Chief Complaint  ?Patient presents with  ? Seizures  ? ? ?Austin Davis is a 81 y.o. male. ? ?HPI ?Patient presents for suspected seizure.  His medical history includes atrial fibrillation, HLD, mitral valve annuloplasty, GI bleed, COPD, chronic respiratory failure s/p tracheostomy and ventilator dependence, failure to thrive, and seizures (on Keppra).  He currently resides at [facility].  He was admitted 1 week ago for seizure-like activity.  At that time, he underwent MRI studies and EEG.  He presented to the ED again yesterday following a 8 to 10-minute episode of staring off into space.  He was discharged from the ED.  He presents today for what was described as a similar episode, this time lasting 6 minutes.  Blood gas following this episode shows severe respiratory acidosis.  Patient is reportedly alert and oriented at baseline but has not been in the past 10 days. ?  ? ?Home Medications ?Prior to Admission medications   ?Medication Sig Start Date End Date Taking? Authorizing Provider  ?acetaminophen (TYLENOL) 650 MG CR tablet Take 650 mg by mouth every 8 (eight) hours as needed for pain. Via tube   Yes [provider]  ?ALPRAZolam Duanne Moron) 0.5 MG tablet Place 1 tablet (0.5 mg total) into feeding tube every 8 (eight) hours as needed for anxiety. 05/01/21  Yes Thurnell Lose, MD  ?cephALEXin (KEFLEX) 250 MG/5ML suspension Take 10 mLs (500 mg total) by mouth 3 (three) times daily for 10 days. ?Patient taking differently: Take 500 mg by mouth every 8 (eight) hours. For 10 days 04/29/21 05/09/21 Yes Cardama, Grayce Sessions, MD  ?Cholecalciferol (VITAMIN D3 SUPER STRENGTH) 50 MCG (2000 UT) TABS Give 2,000 Units by tube daily.   Yes [provider]  ?clonazePAM (KLONOPIN) 0.5 MG tablet Place 1 tablet (0.5 mg total) into feeding tube 2 (two) times  daily. 05/01/21  Yes Thurnell Lose, MD  ?escitalopram (LEXAPRO) 10 MG tablet Place 10 mg into feeding tube daily.   Yes [provider]  ?esomeprazole (NEXIUM) 40 MG packet Take 40 mg by mouth daily. Per G tube   Yes [provider]  ?fexofenadine (ALLEGRA) 180 MG tablet Place 180 mg into feeding tube daily.   Yes [provider]  ?fluticasone (FLONASE) 50 MCG/ACT nasal spray Place 1 spray into both nostrils in the morning and at bedtime.   Yes [provider]  ?folic acid (FOLVITE) 1 MG tablet Take 1 tablet (1 mg total) by mouth daily. ?Patient taking differently: Place 1 mg into feeding tube daily. 09/29/20  Yes [name], MD  ?furosemide (LASIX) 20 MG tablet Place 1 tablet (20 mg total) into feeding tube daily as needed for fluid or edema. 04/16/21  Yes [name], DO  ?guaiFENesin (ROBITUSSIN) 100 MG/5ML liquid Place 400 mg into feeding tube 3 (three) times daily.   Yes [provider]  ?hydrOXYzine (ATARAX) 25 MG tablet Place 25 mg into feeding tube every 6 (six) hours as needed for anxiety.   Yes [provider]  ?Lactobacillus Rhamnosus, GG, (CULTURELLE) CAPS Give 1 capsule by tube daily.   Yes [provider]  ?lactulose (CHRONULAC) 10 GM/15ML solution Place 20 g into feeding tube 2 (two) times daily as needed for mild constipation.   Yes [provider]  ?levalbuterol (XOPENEX) 1.25 MG/3ML nebulizer solution Take 1.25 mg by  nebulization every 6 (six) hours. For 30 days starting on 03-23-21   Yes [provider]  ?levETIRAcetam (KEPPRA) 100 MG/ML solution Place 7.5 mLs (750 mg total) into feeding tube 2 (two) times daily. ?Patient taking differently: Place 500 mg into feeding tube 2 (two) times daily. 05/07/21 07/06/21 Yes Trifan, Carola Rhine, MD  ?levothyroxine (SYNTHROID) 25 MCG tablet Place 25 mcg into feeding tube daily before breakfast.   Yes [provider]  ?midodrine (PROAMATINE) 10 MG tablet Place  1 tablet (10 mg total) into feeding tube 3 (three) times daily with meals. 05/01/21  Yes Thurnell Lose, MD  ?Omega-3 Fatty Acids (SUPER OMEGA 3 EPA/DHA) 1000 MG CAPS Give 1,000 mg by tube daily.   Yes [provider]  ?Pollen Extracts (PROSTAT PO) Give 30 mLs by tube in the morning and at bedtime.   Yes [provider]  ?polyethylene glycol powder (GLYCOLAX/MIRALAX) 17 GM/SCOOP powder Place 17 g into feeding tube every other day.   Yes [provider]  ?propranolol (INDERAL) 10 MG tablet Place 1 tablet (10 mg total) into feeding tube daily as needed (for tremors in AM. do not administer if heart rate is below 60 or systolic blood pressure is below 90.). ?Patient taking differently: Place 10 mg into feeding tube daily. 05/01/21  Yes Thurnell Lose, MD  ?sodium chloride (OCEAN) 0.65 % SOLN nasal spray Place 1 spray into both nostrils every 4 (four) hours as needed for congestion.   Yes [provider]  ?tamsulosin (FLOMAX) 0.4 MG CAPS capsule Take 0.4 mg by mouth at bedtime.   Yes [provider]  ?vitamin C (ASCORBIC ACID) 500 MG tablet Place 500 mg into feeding tube daily.   Yes [provider]  ?zinc gluconate 50 MG tablet Place 50 mg into feeding tube 2 (two) times daily.   Yes [provider]  ?ipratropium-albuterol (DUONEB) 0.5-2.5 (3) MG/3ML SOLN Take 3 mLs by nebulization every 6 (six) hours as needed (shortness of breath). ?Patient not taking: Reported on 05/08/2021 05/01/21   Thurnell Lose, MD  ?Multiple Vitamin (MULTIVITAMIN WITH MINERALS) TABS tablet Take 1 tablet by mouth daily. ?Patient not taking: Reported on 05/08/2021 09/29/20   [name], MD  ?   ? ?Allergies    ?Prednisone and Cortisone   ? ?Review of Systems   ?Review of Systems  ?Unable to perform ROS: Mental status change  ? ?Physical Exam ?Updated Vital Signs ?BP 131/76   Pulse 78   Resp 20   Ht 5\' 10"  (1.778 m)   Wt 56.7 kg   SpO2 100%   BMI 17.94 kg/m?   ?Physical Exam ?Constitutional:   ?   Appearance: He is ill-appearing and diaphoretic.  ?HENT:  ?   Head: Atraumatic.  ?   Right Ear: External ear normal.  ?   Left Ear: External ear normal.  ?   Nose: Nose normal.  ?   Mouth/Throat:  ?   Mouth: Mucous membranes are moist.  ?   Pharynx: Oropharynx is clear.  ?Eyes:  ?   Conjunctiva/sclera: Conjunctivae normal.  ?Pulmonary:  ?   Breath sounds: Decreased air movement present. Decreased breath sounds present.  ?   Comments: Tracheostomy present.  On ventilator. ?Abdominal:  ?   Palpations: Abdomen is soft.  ?   Tenderness: There is no abdominal tenderness.  ?Musculoskeletal:  ?   Cervical back: Neck supple.  ?Skin: ?   General: Skin is warm.  ?   Coloration:  Skin is not jaundiced.  ?Neurological:  ?   Mental Status: He is lethargic.  ?   GCS: GCS eye subscore is 4. GCS verbal subscore is 1. GCS motor subscore is 5.  ? ? ?ED Results / Procedures / Treatments   ?Labs ?(all labs ordered are listed, but only abnormal results are displayed) ?Labs Reviewed  ?LACTIC ACID, PLASMA - Abnormal; Notable for the following components:  ?    Result Value  ? Lactic Acid, Venous 2.0 (*)   ? All other components within normal limits  ?LACTIC ACID, PLASMA - Abnormal; Notable for the following components:  ? Lactic Acid, Venous 3.9 (*)   ? All other components within normal limits  ?COMPREHENSIVE METABOLIC PANEL - Abnormal; Notable for the following components:  ? Potassium 5.5 (*)   ? CO2 38 (*)   ? Glucose, Bld 186 (*)   ? Total Protein 8.3 (*)   ? Alkaline Phosphatase 138 (*)   ? All other components within normal limits  ?CBC WITH DIFFERENTIAL/PLATELET - Abnormal; Notable for the following components:  ? WBC 17.6 (*)   ? MCHC 29.5 (*)   ? RDW 16.2 (*)   ? Platelets 436 (*)   ? Neutro Abs 16.6 (*)   ? Lymphs Abs 0.3 (*)   ? Abs Immature Granulocytes 0.09 (*)   ? All other components within normal limits  ?URINALYSIS, ROUTINE W REFLEX MICROSCOPIC - Abnormal; Notable for the following  components:  ? Color, Urine AMBER (*)   ? APPearance TURBID (*)   ? Protein, ur 100 (*)   ? Leukocytes,Ua LARGE (*)   ? All other components within normal limits  ?BRAIN NATRIURETIC PEPTIDE - Abnormal; Notable for the f

## 2021-05-08 NOTE — Progress Notes (Signed)
Pharmacy Antibiotic Note ? ?Austin Davis is a 81 y.o. male for which pharmacy has been consulted for cefepime dosing for UTI. ? ?Patient with a history of chronic resp failure s/p trach and vent dependent, HF, COPD, AF, DVT, GOB, tremor. Patient presenting with questionable seizure. ? ?SCr 0.67 - at baseline ?WBC 17.6; LA 3.9; T 97.5 F; HR 93>63; RR 13>15 ?COVID/Flu - neg ? ?VRE in urine in March -- Zyvox on board ? ?Plan: ?Vancomycin x 1 in ED, transitioned to Zyvox for recent VRE ?Cefepime 2g q12hr ?Trend WBC, Fever, Renal function, & Clinical course ?F/u cultures, clinical course, WBC, fever ?De-escalate when able ? ?Height: 5\' 10"  (177.8 cm) ?Weight: 56.7 kg (125 lb) ?IBW/kg (Calculated) : 73 ? ?Temp (24hrs), Avg:98.4 ?F (36.9 ?C), Min:98.4 ?F (36.9 ?C), Max:98.4 ?F (36.9 ?C) ? ?Recent Labs  ?Lab 05/07/21 ?2018 05/08/21 ?1145  ?WBC 11.5* 17.6*  ?CREATININE 0.36* 0.67  ?LATICACIDVEN  --  2.0*  ?  ?Estimated Creatinine Clearance: 59.1 mL/min (by C-G formula based on SCr of 0.67 mg/dL).   ? ?Allergies  ?Allergen Reactions  ? Prednisone Other (See Comments)  ?  Makes skin crawl, rapid HR  ? Cortisone Palpitations  ? ? ?Antimicrobials this admission: ?Vancomycin x 1 in ED ?Linezolid 4/19 >>  ?cefepime 4/19 >> ? ?Microbiology results: ?Pending ? ?Thank you for allowing pharmacy to be a part of this patient?s care. ? ?Lorelei Pont, PharmD, BCPS ?05/08/2021 2:44 PM ?ED Clinical Pharmacist -  [phone] ?  ?

## 2021-05-08 NOTE — ED Notes (Signed)
RT, pharm tech, and xray at [facility]. Unable to draw labs from R upper arm midline. Flushes easily.  ?

## 2021-05-08 NOTE — Progress Notes (Signed)
eLink Physician-Brief Progress Note ?Patient Name: Austin Davis ?DOB: [birth year] ?MRN: [id number] ? ? ?Date of Service ? 05/08/2021  ?HPI/Events of Note ? Patient with ventilator dependent chronic respiratory failure s/p tracheostomy  admitted with acute on chronic respiratory failure,  pneumonia r/o aspiration,  possible seizures, and metabolic encephalopathy.  ?eICU Interventions ? Ne Patient Evaluation.  ? ? ? ?  ? ?Frederik Pear ?05/08/2021, 10:24 PM ?

## 2021-05-08 NOTE — Assessment & Plan Note (Signed)
-  Probably combined effect of acute hypercapnic respiratory failure/acute respiratory acidosis plus UTI. ?-CO2 retention likely secondary to new onset of right lower lobe pneumonia/aspiration pneumonia which is new compared to CT scanning done March 7.  For now we will continue treatment with Zyvox to cover VRE UTI recent and cefepime to cover both pneumonia and UTI.  Continue n.p.o. and tube feeding. ?-UTI, probably failed outpatient treatment with still on Keflex until yesterday.  However urine culture from last month showed Pseudomonas and VRE, will change antibiotic coverage to Zyvox and cefepime ?-Other Ddx, recurrent seizure less likely given significant improvement after IV fluids and antibiotics in the ED. ?

## 2021-05-09 DIAGNOSIS — R569 Unspecified convulsions: Secondary | ICD-10-CM

## 2021-05-09 DIAGNOSIS — I480 Paroxysmal atrial fibrillation: Secondary | ICD-10-CM

## 2021-05-09 DIAGNOSIS — G4733 Obstructive sleep apnea (adult) (pediatric): Secondary | ICD-10-CM

## 2021-05-09 DIAGNOSIS — R627 Adult failure to thrive: Secondary | ICD-10-CM

## 2021-05-09 DIAGNOSIS — J9503 Malfunction of tracheostomy stoma: Secondary | ICD-10-CM

## 2021-05-09 DIAGNOSIS — E875 Hyperkalemia: Secondary | ICD-10-CM

## 2021-05-09 DIAGNOSIS — G250 Essential tremor: Secondary | ICD-10-CM

## 2021-05-09 DIAGNOSIS — G9341 Metabolic encephalopathy: Secondary | ICD-10-CM | POA: Diagnosis not present

## 2021-05-09 DIAGNOSIS — G9340 Encephalopathy, unspecified: Secondary | ICD-10-CM

## 2021-05-09 DIAGNOSIS — E43 Unspecified severe protein-calorie malnutrition: Secondary | ICD-10-CM

## 2021-05-09 DIAGNOSIS — J9621 Acute and chronic respiratory failure with hypoxia: Secondary | ICD-10-CM | POA: Diagnosis not present

## 2021-05-09 DIAGNOSIS — I5032 Chronic diastolic (congestive) heart failure: Secondary | ICD-10-CM

## 2021-05-09 LAB — GLUCOSE, CAPILLARY
Glucose-Capillary: 101 mg/dL — ABNORMAL HIGH (ref 70–99)
Glucose-Capillary: 103 mg/dL — ABNORMAL HIGH (ref 70–99)
Glucose-Capillary: 113 mg/dL — ABNORMAL HIGH (ref 70–99)
Glucose-Capillary: 120 mg/dL — ABNORMAL HIGH (ref 70–99)
Glucose-Capillary: 127 mg/dL — ABNORMAL HIGH (ref 70–99)
Glucose-Capillary: 136 mg/dL — ABNORMAL HIGH (ref 70–99)
Glucose-Capillary: 85 mg/dL (ref 70–99)

## 2021-05-09 LAB — CBC
HCT: 30.6 % — ABNORMAL LOW (ref 39.0–52.0)
Hemoglobin: 9.7 g/dL — ABNORMAL LOW (ref 13.0–17.0)
MCH: 28.4 pg (ref 26.0–34.0)
MCHC: 31.7 g/dL (ref 30.0–36.0)
MCV: 89.7 fL (ref 80.0–100.0)
Platelets: 269 10*3/uL (ref 150–400)
RBC: 3.41 MIL/uL — ABNORMAL LOW (ref 4.22–5.81)
RDW: 16.8 % — ABNORMAL HIGH (ref 11.5–15.5)
WBC: 10.6 10*3/uL — ABNORMAL HIGH (ref 4.0–10.5)
nRBC: 0 % (ref 0.0–0.2)

## 2021-05-09 LAB — BASIC METABOLIC PANEL
Anion gap: 8 (ref 5–15)
BUN: 22 mg/dL (ref 8–23)
CO2: 28 mmol/L (ref 22–32)
Calcium: 8.9 mg/dL (ref 8.9–10.3)
Chloride: 101 mmol/L (ref 98–111)
Creatinine, Ser: 0.57 mg/dL — ABNORMAL LOW (ref 0.61–1.24)
GFR, Estimated: >60 mL/min (ref >60)
Glucose, Bld: 105 mg/dL — ABNORMAL HIGH (ref 70–99)
Potassium: 3.3 mmol/L — ABNORMAL LOW (ref 3.5–5.1)
Sodium: 137 mmol/L (ref 135–145)

## 2021-05-09 LAB — PROCALCITONIN
Procalcitonin: <0.1 ng/mL
Procalcitonin: <0.1 ng/mL

## 2021-05-09 LAB — MAGNESIUM
Magnesium: 1.9 mg/dL (ref 1.7–2.4)
Magnesium: 2.5 mg/dL — ABNORMAL HIGH (ref 1.7–2.4)

## 2021-05-09 LAB — PHOSPHORUS
Phosphorus: 1.7 mg/dL — ABNORMAL LOW (ref 2.5–4.6)
Phosphorus: 2.2 mg/dL — ABNORMAL LOW (ref 2.5–4.6)

## 2021-05-09 MED ORDER — BUDESONIDE 0.5 MG/2ML IN SUSP
0.5000 mg | Freq: Two times a day (BID) | RESPIRATORY_TRACT | Status: DC
  Administered 2021-05-09 – 2021-05-10 (×2): 0.5 mg via RESPIRATORY_TRACT
  Filled 2021-05-09 (×2): qty 2

## 2021-05-09 MED ORDER — SODIUM CHLORIDE 0.9 % IV SOLN: INTRAVENOUS | Status: DC | PRN

## 2021-05-09 MED ORDER — POTASSIUM PHOSPHATES 15 MMOLE/5ML IV SOLN
20.0000 mmol | Freq: Once | INTRAVENOUS | Status: AC
  Administered 2021-05-09: 20 mmol via INTRAVENOUS
  Filled 2021-05-09: qty 6.67

## 2021-05-09 MED ORDER — PROSOURCE TF PO LIQD
45.0000 mL | Freq: Two times a day (BID) | ORAL | Status: DC
  Administered 2021-05-09 – 2021-05-10 (×3): 45 mL
  Filled 2021-05-09 (×3): qty 45

## 2021-05-09 MED ORDER — ADULT MULTIVITAMIN W/MINERALS CH
1.0000 | ORAL_TABLET | Freq: Every day | ORAL | Status: DC
  Administered 2021-05-09 – 2021-05-10 (×2): 1
  Filled 2021-05-09 (×2): qty 1

## 2021-05-09 MED ORDER — LEVETIRACETAM 100 MG/ML PO SOLN
500.0000 mg | Freq: Two times a day (BID) | ORAL | Status: DC
  Administered 2021-05-09 – 2021-05-10 (×3): 500 mg
  Filled 2021-05-09 (×3): qty 5

## 2021-05-09 MED ORDER — VITAL HIGH PROTEIN PO LIQD: 1000.0000 mL | ORAL | Status: DC

## 2021-05-09 MED ORDER — POTASSIUM CHLORIDE 10 MEQ/100ML IV SOLN
10.0000 meq | INTRAVENOUS | Status: AC
  Administered 2021-05-09 (×4): 10 meq via INTRAVENOUS
  Filled 2021-05-09: qty 100

## 2021-05-09 MED ORDER — JEVITY 1.5 CAL/FIBER PO LIQD
1000.0000 mL | ORAL | Status: DC
  Administered 2021-05-09: 1000 mL
  Filled 2021-05-09 (×2): qty 1000

## 2021-05-09 MED ORDER — DOXAZOSIN MESYLATE 4 MG PO TABS
4.0000 mg | ORAL_TABLET | Freq: Every day | ORAL | Status: DC
  Administered 2021-05-09: 4 mg
  Filled 2021-05-09 (×2): qty 1

## 2021-05-09 MED ORDER — MAGNESIUM SULFATE 2 GM/50ML IV SOLN
2.0000 g | Freq: Once | INTRAVENOUS | Status: AC
  Administered 2021-05-09: 2 g via INTRAVENOUS
  Filled 2021-05-09: qty 50

## 2021-05-09 NOTE — Progress Notes (Signed)
Daughter, Austin Davis, called for update but no answer.  Mailbox full, unable to leave message. Son, Austin Davis, called as well and attempted to update him.  He asked that we call his sister Austin Davis around 6pm when she gets off work.  Merry Proud asked for conference call.  We reviewed the details of the patients admission and concerns for LLL spiculated mass.  Will follow up with family.  Recommend goals of care meeting with family.  ? ? ? ?Noe Gens, MSN, APRN, NP-C, AGACNP-BC ?[facility] ?05/09/2021, 11:31 AM ? ? ?Please see [email or web address] for pager details.  ? ?From 7A-7P if no response, please call [phone] ?After hours, please call ELink [phone] ? ?

## 2021-05-09 NOTE — TOC Progression Note (Signed)
Transition of Care (TOC) - Progression Note  ? ? ?Patient Details  ?Name: Austin Davis ?MRN: [id number] ?Date of Birth: 1940/05/03 ? ?Transition of Care (TOC) CM/SW Contact  ?Angelita Ingles, RN ?Phone Number:[phone] ? ?05/09/2021, 3:07 PM ? ?Clinical Narrative:    ? ?Transition of Care (TOC) Screening Note ? ? ?Patient Details  ?Name: Austin Davis ?Date of Birth: 12-Sep-1940 ? ? ?Transition of Care (TOC) CM/SW Contact:    ?Angelita Ingles, RN ?Phone Number: ?05/09/2021, 3:07 PM ? ? ? ?Transition of Care Department [facility]) has reviewed patient and no TOC needs have been identified at this time. We will continue to monitor patient advancement through interdisciplinary progression rounds. If new patient transition needs arise, please place a TOC consult. ? ? ? ? ?  ?  ? ?Expected Discharge Plan and Services ?  ?  ?  ?  ?  ?                ?  ?  ?  ?  ?  ?  ?  ?  ?  ?  ? ? ?Social Determinants of Health (SDOH) Interventions ?  ? ?Readmission Risk Interventions ?   ? View : No data to display.  ?  ?  ?  ? ? ?

## 2021-05-09 NOTE — Hospital Course (Addendum)
HPI per Dr. Wynetta Fines on 05/08/21 ?CORWIN KUIKEN is a 81 y.o. 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 ?  ?Symptoms started yesterday, LTAC staff noticed patient started to have episodes of eyes staring at sittings, less responsive to nursing staff and decreased extremity movement.  Patient was sent to the ED for evaluation.  Yesterday's ED work-up were benign and patient sent back to LTAC.  But overnight, appears that patient had recurrent similar episodes of staring, and also noticed the patient O2 saturation dropped to 80% and patient was sent back to ED. ?  ?ED Course: Afebrile, blood pressure borderline low no significant hypoxia but ABG showed new onset of respiratory acidosis 7.2 6/89/499, leukocytosis 17.6 compared to yesterday 11. ?  ?CT chest showed right lower lobe infiltrates and consolidations, and urine appears to to be cloudy.  UA pending.  Patient was given vancomycin and cefepime in the ED. ? ?**Interim History ?He continues to try and speak to [place] and tracheostomy is now position and has not had any further ventilation problems since his exchange of his tracheostomy tube yesterday.  The pulmonary team is concerned that he will have dislodgment issues with the tracheostomy again and they are recommending continue using that size tube with secure tracheostomy ties.  In addition he also has a spiculated nodule in left lobe with concern for cancer and they are recommending goals of care discussion and we will consult palliative care for further goals of care discussion. ? ?Assessment/Plan ?Principal Problem: ?  Acute metabolic encephalopathy ?Active Problems: ?  Essential tremor ?  Hypotension ?  Acute on chronic respiratory failure with hypoxia and hypercapnia (HCC) ?  Failure to  thrive in adult ?  AF (paroxysmal atrial fibrillation) ([place]) ?  Protein-calorie malnutrition, severe ?  Obstructive sleep apnea ?  Chronic obstructive pulmonary disease (HCC) ?  Chronic heart failure with preserved ejection fraction ([place]) ?  Aspiration pneumonia ([place]) ?  Ventilator dependent ([place]) ?  Seizure ([place]) ?  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[place]) ?  Pneumonia ? ?Recurrent UTI (urinary tract infection) ?-As above, most recent urine culture showed Pseudomonas and VRE, antibiotics coverage with Zyvox and cefepime. ?-Urinalysis done and showed turbid appearance with amber-colored urine, large leukocytes, negative nitrites, many bacteria, greater than 50 WBCs with urine culture pending ? ?Hyperkalemia -> Hypokalemia ?-Secondary to acute acidosis, normal creatinine level, recheck BMP tomorrow. ?-K+ is now 3.3 so will replete ?-Continue to Monitor and Replete as Necessary ?-Repeat CMP in the AM ? ?Spiculated LLL Nodule  ?-Nodules has tags to the adjacent pleura, measuring 8x12 mm.  ?-Will need to determine candidacy for further assessment of nodule, goals of care.   ?-Palliative Care consulted given that Prognosis is Poor ? ?Hypophosphatemia ?-Patient's Phos Level is 1.7 ?-Replete with IV K Phos 20 mmol ?-Continue to Monitor and Replete as Necessary ?-Repeat Phos Level in the AM ? ?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[place]) ?- Appreciate pulmonary evaluation and they did exchange his tracheostomy ?-See above further recommendations ? ?Seizure ([place]) ?Doubt this was a seizure and likely had hypoxemic related events. ? ?Ventilator dependent ([place]) ?- Continue full vent support and pulmonary recommends continuing #6 XLT distal tracheostomy with secure ties with 2 fingers breath tension on the ties ?They are also recommending continuing bronchodilators and recommending continuing treatment for HCAP ?-Pulmonary recommending PRVC at 8  cc/kg with a rate of 15 ?-They are also recommending following intermittent chest x-ray as well as trach  care per protocol ?-Continue [place] Norse, and as needed DuoNeb and albuterol ? ?Acute on chronic respiratory failure with hypoxia and hypercapnia (HCC) ?-Probably combined effect of acute hypercapnic respiratory failure/acute respiratory acidosis plus UTI. ?-CO2 retention likely secondary to new onset of right lower lobe pneumonia/aspiration pneumonia which is new compared to CT scanning done March 7.  For now we will continue treatment with Zyvox to cover VRE UTI recent and cefepime to cover both pneumonia and UTI.  Continue n.p.o. and tube feeding. ?-UTI, probably failed outpatient treatment with still on Keflex until yesterday.  However urine culture from last month showed Pseudomonas and VRE, will change antibiotic coverage to Zyvox and cefepime ?-Other Ddx, recurrent seizure less likely given significant improvement after IV fluids and antibiotics in the ED. ?-Trach-collar exchanged by critical care physician in the ED, repeat blood gas showed resolution of the hypercapnia.  Continue current ventilator setting, ?-Antibiotics as above ? ?Acute metabolic encephalopathy ?-Probably combined effect of acute hypercapnic respiratory failure/acute respiratory acidosis plus UTI. ?-CO2 retention likely secondary to new onset of right lower lobe pneumonia/aspiration pneumonia which is new compared to CT scanning done March 7.  For now we will continue treatment with Zyvox to cover VRE UTI recent and cefepime to cover both pneumonia and UTI.  Continue n.p.o. and tube feeding. ?-UTI, probably failed outpatient treatment with still on Keflex until yesterday.  However urine culture from last month showed Pseudomonas and VRE, will change antibiotic coverage to Zyvox and cefepime ?-Other Ddx, recurrent seizure less likely given significant improvement after IV fluids and antibiotics in the ED. ?-Continue to Monitor and Delirium Precautions ?-C/w Abx ?    ?Seizure Disorder ?-As discussed above, will continue Keppra 500 mg  twice daily ?  ?Chronic COPD ?-Lung exam not showing significant wheezing, continue current breathing treatment ?  ?Chronic systolic CHF ?-Appears to be euvolemic, hold off Lasix ?  ?PAF status post MAZE ?-In sinus rhythm ? ?

## 2021-05-09 NOTE — Progress Notes (Addendum)
Pharmacy Electrolyte Replacement ? ?Recent Labs: ? ?Recent Labs  ?  05/08/21 ?1145 05/08/21 ?1522 05/09/21 ?3005  ?K 5.5*   < > 3.3*  ?MG 2.4  --   --   ?CREATININE 0.67  --  0.57*  ? < > = values in this interval not displayed.  ? ? ?Low Critical Values (K </= 2.5, Phos </= 1, Mg </= 1) Present: None ? ? ?Plan:  ?KCl IV 10 mEq x4 ?Mg Sulfate 2g IV x 1 ?F/up labs in the morning ? ? ?Joseph Art, Pharm.D. ?PGY-1 Pharmacy Resident ?[phone] ?05/09/2021 8:10 AM ? ?

## 2021-05-09 NOTE — Progress Notes (Signed)
Initial Nutrition Assessment ? ?DOCUMENTATION CODES:  ? ?Severe malnutrition in context of chronic illness ? ?INTERVENTION:  ? ?Initiate tube feeding via PEG: ?Jevity 1.5 at 55 ml/h (1320 ml per day) ?Prosource TF 45 ml BID ? ?Provides 2060 kcal, 106 gm protein, 1003 ml free water daily. ? ?MVI with minerals daily via tube.  ? ?NUTRITION DIAGNOSIS:  ? ?Severe Malnutrition related to chronic illness (COPD, CHF) as evidenced by severe muscle depletion, severe fat depletion. ? ?GOAL:  ? ?Patient will meet greater than or equal to 90% of their needs ? ?MONITOR:  ? ?Vent status, TF tolerance, Skin ? ?REASON FOR ASSESSMENT:  ? ?Ventilator, Consult ?Enteral/tube feeding initiation and management ? ?ASSESSMENT:  ? ?81 yo male admitted with malpositioned trach with airway obstruction, RLL PNA. PMH includes chronic trach, vent dependence, severe COPD, seizures, HLD, A fib, pulmonary cachexia. ? ?Discussed patient in ICU rounds and with RN today. ?Trach exchanged yesterday. ?Phos and potassium are being repleted today.  ?New spiculated nodule found in left lower lobe. ?Plans to have goals of care conversation with family soon. ? ?Patient has a PEG. Usual TF regimen at [facility] is Jevity 1.5 at 50 ml/h with Pro-stat 30 ml BID to provide 2000 kcal and 107 gm protein daily.  ? ?Patient is on chronic vent support via trach.  ?MV: 8.4 L/min ?Temp (24hrs), Avg:98.3 ?F (36.8 ?C), Min:97.5 ?F (36.4 ?C), Max:99 ?F (37.2 ?C) ? ? ?Weight history reviewed. Weight stable since hospital admission last week. Overall, weight has declined by 8% over the past month. 7 months ago, patient weighed 48.9 kg.  Weight fluctuations likely partially related to fluids. Unable to determine actual dry weight loss.  ? ?Labs reviewed. K 3.3, Phos 1.7 ?CBG: [phone] ? ?Medications reviewed and include acidophilus, Keppra, MVI with minerals, Miralax, Flomax, KCl. ? ?NUTRITION - FOCUSED PHYSICAL EXAM: ? ?Flowsheet Row Most Recent Value  ?Orbital Region  Severe depletion  ?Upper Arm Region Severe depletion  ?Thoracic and Lumbar Region Severe depletion  ?Buccal Region Severe depletion  ?Temple Region Severe depletion  ?Clavicle Bone Region Severe depletion  ?Clavicle and Acromion Bone Region Severe depletion  ?Scapular Bone Region Severe depletion  ?Dorsal Hand Severe depletion  ?Patellar Region Severe depletion  ?Anterior Thigh Region Severe depletion  ?Posterior Calf Region Severe depletion  ?Edema (RD Assessment) Mild  ?Hair Reviewed  ?Eyes Unable to assess  ?Mouth Unable to assess  ?Skin Reviewed  ?Nails Reviewed  ? ?  ? ? ?Diet Order:   ?Diet Order   ? ?       ?  Diet NPO time specified  Diet effective now       ?  ? ?  ?  ? ?  ? ? ?EDUCATION NEEDS:  ? ?No education needs have been identified at this time ? ?Skin:  Skin Assessment: Skin Integrity Issues: ?Skin Integrity Issues:: Stage I ?Stage I: sacrum ? ?Last BM:  no BM documented ? ?Height:  ? ?Ht Readings from Last 1 Encounters:  ?05/08/21 5\' 10"  (1.778 m)  ? ? ?Weight:  ? ?Wt Readings from Last 1 Encounters:  ?05/08/21 56.7 kg  ? ? ?BMI:  Body mass index is 17.94 kg/m?. ? ?Estimated Nutritional Needs:  ? ?Kcal:  2000-2200 ? ?Protein:  100-120 gm ? ?Fluid:  2-2.2 L ? ? ? ?Lucas Mallow RD, LDN, CNSC ?Please refer to Amion for contact information.                                                       ? ?

## 2021-05-09 NOTE — Progress Notes (Signed)
Patient seen today by trach team for consult.  No education is needed at this time.  All necessary equipment is at beside.   Will continue to follow for progression. Patient is still on full ventilatory support. 

## 2021-05-09 NOTE — Assessment & Plan Note (Signed)
Doubt this was a seizure and likely had hypoxemic related events. ?

## 2021-05-09 NOTE — Progress Notes (Signed)
eLink Physician-Brief Progress Note ?Patient Name: Austin Davis ?DOB: Feb 14, 1940 ?MRN: [id number] ? ? ?Date of Service ? 05/09/2021  ?HPI/Events of Note ? Patient on 0.4 mg of Flomax at home, which cannot be given down a PEG tube.  ?eICU Interventions ? Therapeutic substitution with Doxazosin 4 mg, pharmacist has placed order.  ? ? ? ?  ? ?Kerry Kass [place] ?05/09/2021, 10:40 PM ?

## 2021-05-09 NOTE — Progress Notes (Addendum)
PROGRESS NOTE    Austin Davis  ZOX:[id number] DOB: 04-12-1940 DOA: 05/08/2021 PCP: Austin Pour, MD   Brief Narrative:  HPI per Dr. Mikey Davis on 05/08/21 Austin Davis is a 81 y.o. 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 ([place]) for question of seizure.   Symptoms started yesterday, LTAC staff noticed patient started to have episodes of eyes staring at sittings, less responsive to nursing staff and decreased extremity movement.  Patient was sent to the ED for evaluation.  Yesterday's ED work-up were benign and patient sent back to LTAC.  But overnight, appears that patient had recurrent similar episodes of staring, and also noticed the patient O2 saturation dropped to 80% and patient was sent back to ED.   ED Course: Afebrile, blood pressure borderline low no significant hypoxia but ABG showed new onset of respiratory acidosis 7.2 6/89/499, leukocytosis 17.6 compared to yesterday 11.   CT chest showed right lower lobe infiltrates and consolidations, and urine appears to to be cloudy.  UA pending.  Patient was given vancomycin and cefepime in the ED.  **Interim History He continues to try and speak to [place] and tracheostomy is now position and has not had any further ventilation problems since his exchange of his tracheostomy tube yesterday.  The pulmonary team is concerned that he will have dislodgment issues with the tracheostomy again and they are recommending continue using that size tube with secure tracheostomy ties.  In addition he also has a spiculated nodule in left lobe with concern for cancer and they are recommending goals of care discussion and we will consult palliative care for further goals of care discussion.  Assessment/Plan Principal Problem:   Acute metabolic  encephalopathy Active Problems:   Essential tremor   Hypotension   Acute on chronic respiratory failure with hypoxia and hypercapnia (HCC)   Failure to thrive in adult   AF (paroxysmal atrial fibrillation) (HCC)   Protein-calorie malnutrition, severe   Obstructive sleep apnea   Chronic obstructive pulmonary disease (HCC)   Chronic heart failure with preserved ejection fraction (HCC)   Aspiration pneumonia (HCC)   Ventilator dependent (HCC)   Seizure (HCC)   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HCC)   Pneumonia  Recurrent UTI (urinary tract infection) -As above, most recent urine culture showed Pseudomonas and VRE, antibiotics coverage with Zyvox and cefepime. -Urinalysis done and showed turbid appearance with amber-colored urine, large leukocytes, negative nitrites, many bacteria, greater than 50 WBCs with urine culture pending  Hyperkalemia -> Hypokalemia -Secondary to acute acidosis, normal creatinine level, recheck BMP tomorrow. -K+ is now 3.3 so will replete -Continue to Monitor and Replete as Necessary -Repeat CMP in the AM  Spiculated LLL Nodule  -Nodules has tags to the adjacent pleura, measuring 8x12 mm.  -Will need to determine candidacy for further assessment of nodule, goals of care.   -Palliative Care consulted given that Prognosis is Poor  Hypophosphatemia -Patient's Phos Level is 1.7 -Replete with IV K Phos 20 mmol -Continue to Monitor and Replete as Necessary -Repeat Phos Level in the AM  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HCC) - Appreciate pulmonary evaluation and they did exchange his tracheostomy -See above further recommendations  Seizure (HCC) Doubt this was a seizure and likely had hypoxemic related events.  Ventilator dependent (HCC) - Continue full vent support and pulmonary recommends continuing #6 XLT distal tracheostomy with secure ties with  2 fingers breath tension on the ties They are also recommending continuing bronchodilators and recommending continuing  treatment for HCAP -Pulmonary recommending PRVC at 8 cc/kg with a rate of 15 -They are also recommending following intermittent chest x-ray as well as trach care per protocol -Continue [name], and as needed DuoNeb and albuterol  Acute on chronic respiratory failure with hypoxia and hypercapnia (HCC) -Probably combined effect of acute hypercapnic respiratory failure/acute respiratory acidosis plus UTI. -CO2 retention likely secondary to new onset of right lower lobe pneumonia/aspiration pneumonia which is new compared to CT scanning done March 7.  For now we will continue treatment with Zyvox to cover VRE UTI recent and cefepime to cover both pneumonia and UTI.  Continue n.p.o. and tube feeding. -UTI, probably failed outpatient treatment with still on Keflex until yesterday.  However urine culture from last month showed Pseudomonas and VRE, will change antibiotic coverage to Zyvox and cefepime -Other Ddx, recurrent seizure less likely given significant improvement after IV fluids and antibiotics in the ED. -Trach-collar exchanged by critical care physician in the ED, repeat blood gas showed resolution of the hypercapnia.  Continue current ventilator setting, -Antibiotics as above  Acute metabolic encephalopathy -Probably combined effect of acute hypercapnic respiratory failure/acute respiratory acidosis plus UTI. -CO2 retention likely secondary to new onset of right lower lobe pneumonia/aspiration pneumonia which is new compared to CT scanning done March 7.  For now we will continue treatment with Zyvox to cover VRE UTI recent and cefepime to cover both pneumonia and UTI.  Continue n.p.o. and tube feeding. -UTI, probably failed outpatient treatment with still on Keflex until yesterday.  However urine culture from last month showed Pseudomonas and VRE, will change antibiotic coverage to Zyvox and cefepime -Other Ddx, recurrent seizure less likely given significant improvement after IV fluids  and antibiotics in the ED. -Continue to Monitor and Delirium Precautions -C/w Abx     Seizure Disorder -As discussed above, will continue Keppra 500 mg twice daily   Chronic COPD -Lung exam not showing significant wheezing, continue current breathing treatment   Chronic systolic CHF -Appears to be euvolemic, hold off Lasix   PAF status post MAZE -In sinus rhythm   DVT prophylaxis: enoxaparin (LOVENOX) injection 40 mg Start: 05/08/21 1545    Code Status: Full Code Family Communication: No family present at bedside   Disposition Plan:  Level of care: ICU Status is: Inpatient Remains inpatient appropriate because: Needs further clinical improvement    Consultants:  PCCM  Procedures:  Tracheostomy Exchange  Antimicrobials:  Anti-infectives (From admission, onward)    Start     Dose/Rate Route Frequency Ordered Stop   05/09/21 0300  ceFEPIme (MAXIPIME) 2 g in sodium chloride 0.9 % 100 mL IVPB        2 g 200 mL/hr over 30 Minutes Intravenous Every 12 hours 05/08/21 2011     05/08/21 2000  linezolid (ZYVOX) IVPB 600 mg        600 mg 300 mL/hr over 60 Minutes Intravenous Every 12 hours 05/08/21 1603     05/08/21 1445  ceFEPIme (MAXIPIME) 2 g in sodium chloride 0.9 % 100 mL IVPB        2 g 200 mL/hr over 30 Minutes Intravenous  Once 05/08/21 1433 05/08/21 1753   05/08/21 1445  vancomycin (VANCOREADY) IVPB 1250 mg/250 mL  Status:  Discontinued        1,250 mg 166.7 mL/hr over 90 Minutes Intravenous  Once 05/08/21 1444 05/08/21 1754  Subjective: And examined at bedside he is try to talk but unable to understand.  Has some tremors.  Appears to be comfortable in his bed now.  Denies any pain.  No other concerns or complaints at this time.  Objective: Vitals:   05/09/21 1501 05/09/21 1516 05/09/21 1600 05/09/21 1700  BP:  (!) 157/88 (!) 126/98 (!) 143/83  Pulse:  68 (!) 50 (!) 49  Resp:  (!) 21 16 15   Temp:   99 F (37.2 C)   TempSrc:      SpO2: 95% 95% 91% 96%   Weight:      Height:        Intake/Output Summary (Last 24 hours) at 05/09/2021 1747 Last data filed at 05/09/2021 1700 Gross per 24 hour  Intake 3109.59 ml  Output 1670 ml  Net 1439.59 ml   Filed Weights   05/08/21 1104  Weight: 56.7 kg   Examination: Physical Exam:  Constitutional: Thin chronically ill-appearing Caucasian male on vent who was trying to speak  Respiratory: Diminished to auscultation bilaterally with coarse breath sounds on the Vent with a Tracheostomy with some rhonchi, no wheezing, rales. Normal respiratory effort and patient is not tachypenic. No accessory muscle use.  Cardiovascular: Mildly tachycardic rate, no murmurs / rubs / gallops. S1 and S2 auscultated. Slight extremity edema.  Abdomen: Soft, non-tender, non-distended.  Bowel sounds positive.  GU: Deferred. Musculoskeletal: No clubbing / cyanosis of digits/nails. No joint deformity upper and lower extremities.  Neurologic: Moves extremities independently and has a tremor  Data Reviewed: I have personally reviewed following labs and imaging studies  CBC: Recent Labs  Lab 05/07/21 2018 05/08/21 1108 05/08/21 1145 05/08/21 1522 05/09/21 0657  WBC 11.5*  --  17.6*  --  10.6*  NEUTROABS  --   --  16.6*  --   --   HGB 9.5* 14.3 13.0 10.2* 9.7*  HCT 31.6* 42.0 44.0 30.0* 30.6*  MCV 93.8  --  96.9  --  89.7  PLT 322  --  436*  --  269   Basic Metabolic Panel: Recent Labs  Lab 05/07/21 2018 05/08/21 1108 05/08/21 1145 05/08/21 1522 05/09/21 0525 05/09/21 0657 05/09/21 1700  NA 142 139 143 139 137  --   --   K 3.7 5.3* 5.5* 3.9 3.3*  --   --   CL 111  --  99  --  101  --   --   CO2 26  --  38*  --  28  --   --   GLUCOSE 112*  --  186*  --  105*  --   --   BUN 17  --  21  --  22  --   --   CREATININE 0.36*  --  0.67  --  0.57*  --   --   CALCIUM 7.2*  --  10.1  --  8.9  --   --   MG  --   --  2.4  --   --  1.9 2.5*  PHOS  --   --   --   --   --  1.7* 2.2*   GFR: Estimated Creatinine  Clearance: 59.1 mL/min (A) (by C-G formula based on SCr of 0.57 mg/dL (L)). Liver Function Tests: Recent Labs  Lab 05/08/21 1145  AST 24  ALT 31  ALKPHOS 138*  BILITOT 0.3  PROT 8.3*  ALBUMIN 3.7   No results for input(s): LIPASE, AMYLASE in the last 168 hours. No  results for input(s): AMMONIA in the last 168 hours. Coagulation Profile: Recent Labs  Lab 05/08/21 1145  INR 1.0   Cardiac Enzymes: No results for input(s): CKTOTAL, CKMB, CKMBINDEX, TROPONINI in the last 168 hours. BNP (last 3 results) No results for input(s): PROBNP in the last 8760 hours. HbA1C: No results for input(s): HGBA1C in the last 72 hours. CBG: Recent Labs  Lab 05/08/21 2126 05/08/21 2357 05/09/21 0338 05/09/21 0749 05/09/21 1140  GLUCAP 85 127* 101* 103* 113*   Lipid Profile: No results for input(s): CHOL, HDL, LDLCALC, TRIG, CHOLHDL, LDLDIRECT in the last 72 hours. Thyroid Function Tests: No results for input(s): TSH, T4TOTAL, FREET4, T3FREE, THYROIDAB in the last 72 hours. Anemia Panel: No results for input(s): VITAMINB12, FOLATE, FERRITIN, TIBC, IRON, RETICCTPCT in the last 72 hours. Sepsis Labs: Recent Labs  Lab 05/08/21 1145 05/08/21 1500 05/08/21 2100 05/09/21 0525  PROCALCITON  --   --  <0.10 <0.10  LATICACIDVEN 2.0* 3.9*  --   --     Recent Results (from the past 240 hour(s))  Resp Panel by RT-PCR (Flu A&B, Covid) Nasopharyngeal Swab     Status: None   Collection Time: 05/01/21 10:50 AM   Specimen: Nasopharyngeal Swab; Nasopharyngeal(NP) swabs in vial transport medium  Result Value Ref Range Status   SARS Coronavirus 2 by RT PCR NEGATIVE NEGATIVE Final    Comment: (NOTE) SARS-CoV-2 target nucleic acids are NOT DETECTED.  The SARS-CoV-2 RNA is generally detectable in upper respiratory specimens during the acute phase of infection. The lowest concentration of SARS-CoV-2 viral copies this assay can detect is 138 copies/mL. A negative result does not preclude  SARS-Cov-2 infection and should not be used as the sole basis for treatment or other patient management decisions. A negative result may occur with  improper specimen collection/handling, submission of specimen other than nasopharyngeal swab, presence of viral mutation(s) within the areas targeted by this assay, and inadequate number of viral copies(<138 copies/mL). A negative result must be combined with clinical observations, patient history, and epidemiological information. The expected result is Negative.  Fact Sheet for Patients:  BloggerCourse.com  Fact Sheet for Healthcare Providers:  SeriousBroker.it  This test is no t yet approved or cleared by the [place] FDA and  has been authorized for detection and/or diagnosis of SARS-CoV-2 by FDA under an Emergency Use Authorization (EUA). This EUA will remain  in effect (meaning this test can be used) for the duration of the COVID-19 declaration under Section 564(b)(1) of the Act, 21 U.S.C.section 360bbb-3(b)(1), unless the authorization is terminated  or revoked sooner.       Influenza A by PCR NEGATIVE NEGATIVE Final   Influenza B by PCR NEGATIVE NEGATIVE Final    Comment: (NOTE) The Xpert Xpress SARS-CoV-2/FLU/RSV plus assay is intended as an aid in the diagnosis of influenza from Nasopharyngeal swab specimens and should not be used as a sole basis for treatment. Nasal washings and aspirates are unacceptable for Xpert Xpress SARS-CoV-2/FLU/RSV testing.  Fact Sheet for Patients: BloggerCourse.com  Fact Sheet for Healthcare Providers: SeriousBroker.it  This test is not yet approved or cleared by the [place] FDA and has been authorized for detection and/or diagnosis of SARS-CoV-2 by FDA under an Emergency Use Authorization (EUA). This EUA will remain in effect (meaning this test can be used) for the duration of  the COVID-19 declaration under Section 564(b)(1) of the Act, 21 U.S.C. section 360bbb-3(b)(1), unless the authorization is terminated or revoked.  Performed at [facility], [address] [place], [place] [address]   Blood Culture (routine x 2)     Status: None (Preliminary result)   Collection Time: 05/08/21 11:05 AM   Specimen: BLOOD  Result Value Ref Range Status   Specimen Description BLOOD BLOOD RIGHT HAND  Final   Special Requests   Final    BOTTLES DRAWN AEROBIC AND ANAEROBIC Blood Culture adequate volume   Culture   Final    NO GROWTH < 24 HOURS Performed at [facility], [address], [place], [place] [address]    Report Status PENDING  Incomplete  Resp Panel by RT-PCR (Flu A&B, Covid) Nasopharyngeal Swab     Status: None   Collection Time: 05/08/21 11:06 AM   Specimen: Nasopharyngeal Swab; Nasopharyngeal(NP) swabs in vial transport medium  Result Value Ref Range Status   SARS Coronavirus 2 by RT PCR NEGATIVE NEGATIVE Final    Comment: (NOTE) SARS-CoV-2 target nucleic acids are NOT DETECTED.  The SARS-CoV-2 RNA is generally detectable in upper respiratory specimens during the acute phase of infection. The lowest concentration of SARS-CoV-2 viral copies this assay can detect is 138 copies/mL. A negative result does not preclude SARS-Cov-2 infection and should not be used as the sole basis for treatment or other patient management decisions. A negative result may occur with  improper specimen collection/handling, submission of specimen other than nasopharyngeal swab, presence of viral mutation(s) within the areas targeted by this assay, and inadequate number of viral copies(<138 copies/mL). A negative result must be combined with clinical observations, patient history, and epidemiological information. The expected result is Negative.  Fact Sheet for Patients:  BloggerCourse.com  Fact Sheet for Healthcare Providers:   SeriousBroker.it  This test is no t yet approved or cleared by the [place] FDA and  has been authorized for detection and/or diagnosis of SARS-CoV-2 by FDA under an Emergency Use Authorization (EUA). This EUA will remain  in effect (meaning this test can be used) for the duration of the COVID-19 declaration under Section 564(b)(1) of the Act, 21 U.S.C.section 360bbb-3(b)(1), unless the authorization is terminated  or revoked sooner.       Influenza A by PCR NEGATIVE NEGATIVE Final   Influenza B by PCR NEGATIVE NEGATIVE Final    Comment: (NOTE) The Xpert Xpress SARS-CoV-2/FLU/RSV plus assay is intended as an aid in the diagnosis of influenza from Nasopharyngeal swab specimens and should not be used as a sole basis for treatment. Nasal washings and aspirates are unacceptable for Xpert Xpress SARS-CoV-2/FLU/RSV testing.  Fact Sheet for Patients: BloggerCourse.com  Fact Sheet for Healthcare Providers: SeriousBroker.it  This test is not yet approved or cleared by the [place] FDA and has been authorized for detection and/or diagnosis of SARS-CoV-2 by FDA under an Emergency Use Authorization (EUA). This EUA will remain in effect (meaning this test can be used) for the duration of the COVID-19 declaration under Section 564(b)(1) of the Act, 21 U.S.C. section 360bbb-3(b)(1), unless the authorization is terminated or revoked.  Performed at [facility], [address] [place], [place] [address]   Blood Culture (routine x 2)     Status: None (Preliminary result)   Collection Time: 05/08/21 11:10 AM   Specimen: BLOOD  Result Value Ref Range Status   Specimen Description BLOOD BLOOD LEFT HAND  Final   Special Requests   Final    BOTTLES DRAWN AEROBIC AND ANAEROBIC Blood Culture results may not be optimal due to an inadequate volume of blood received in culture  bottles   Culture   Final     NO GROWTH < 24 HOURS Performed at [facility], [address] [place], [place] [address]    Report Status PENDING  Incomplete  Urine Culture     Status: None (Preliminary result)   Collection Time: 05/08/21  2:39 PM   Specimen: In/Out Cath Urine  Result Value Ref Range Status   Specimen Description IN/OUT CATH URINE  Final   Special Requests NONE  Final   Culture   Final    CULTURE REINCUBATED FOR BETTER GROWTH Performed at [facility], [address], [place], [place] [address]    Report Status PENDING  Incomplete  MRSA Next Gen by PCR, Nasal     Status: None   Collection Time: 05/08/21  3:38 PM   Specimen: Nasal Mucosa; Nasal Swab  Result Value Ref Range Status   MRSA by PCR Next Gen NOT DETECTED NOT DETECTED Final    Comment: (NOTE) The GeneXpert MRSA Assay (FDA approved for NASAL specimens only), is one component of a comprehensive MRSA colonization surveillance program. It is not intended to diagnose MRSA infection nor to guide or monitor treatment for MRSA infections. Test performance is not FDA approved in patients less than 67 years old. Performed at [facility], [address], [place], [place] [address]     Radiology Studies: CT Head Wo Contrast  Result Date: 05/08/2021 CLINICAL DATA:  Mental status change, recurrent seizures EXAM: CT HEAD WITHOUT CONTRAST TECHNIQUE: Contiguous axial images were obtained from the base of the skull through the vertex without intravenous contrast. RADIATION DOSE REDUCTION: This exam was performed according to the departmental dose-optimization program which includes automated exposure control, adjustment of the mA and/or kV according to patient size and/or use of iterative reconstruction technique. COMPARISON:  04/28/2021 FINDINGS: Brain: No evidence of acute infarction, hemorrhage, extra-axial collection, ventriculomegaly, or mass effect. Generalized cerebral atrophy. Periventricular white matter low attenuation likely  secondary to microangiopathy. Vascular: Cerebrovascular atherosclerotic calcifications are noted. Skull: Negative for fracture or focal lesion. Sinuses/Orbits: Visualized portions of the orbits are unremarkable. Visualized portions of the paranasal sinuses are unremarkable. Visualized portions of the mastoid air cells are unremarkable. Other: None. IMPRESSION: 1. No acute intracranial findings. 2. Chronic small vessel ischemic changes. Electronically Signed   By: Elige Ko M.D.   On: 05/08/2021 13:51   CT Angio Chest PE W and/or Wo Contrast  Result Date: 05/08/2021 CLINICAL DATA:  Seizure, diaphoresis. Evaluate for pulmonary embolus. EXAM: CT ANGIOGRAPHY CHEST WITH CONTRAST TECHNIQUE: Multidetector CT imaging of the chest was performed using the standard protocol during bolus administration of intravenous contrast. Multiplanar CT image reconstructions and MIPs were obtained to evaluate the vascular anatomy. RADIATION DOSE REDUCTION: This exam was performed according to the departmental dose-optimization program which includes automated exposure control, adjustment of the mA and/or kV according to patient size and/or use of iterative reconstruction technique. CONTRAST:  75mL OMNIPAQUE IOHEXOL 350 MG/ML SOLN COMPARISON:  03/25/2021, 10/25/2020, 09/18/2020. FINDINGS: Cardiovascular: Negative for pulmonary embolus. Atherosclerotic calcification of the aorta, aortic valve and coronary arteries. Heart size normal. Left ventricle appears hypertrophied. No pericardial effusion. Mediastinum/Nodes: No pathologically enlarged mediastinal, hilar or axillary lymph nodes. Esophagus is grossly unremarkable. Lungs/Pleura: Tracheostomy is in place. Debris is seen in the lower trachea. Biapical pleuroparenchymal scarring. Spiculated left lower lobe nodule has tags to the adjacent pleura, measuring 8 x 12 mm (8/96) and is stable from 03/25/2021 but new from 09/18/2020. Peribronchovascular ground-glass nodularity  and nodular  consolidation in the right lower lobe. Minimal peribronchovascular ground-glass nodularity in the left lower lobe. Dependent volume loss in the lower lobes bilaterally. Trace bilateral pleural effusions. Upper Abdomen: Visualized portions of the liver, adrenal glands, right kidney, spleen, pancreas, stomach and bowel are grossly unremarkable. Musculoskeletal: Degenerative changes in the spine. Old thoracolumbar compression deformities. No worrisome lytic or sclerotic lesions. Review of the MIP images confirms the above findings. IMPRESSION: 1. Negative for pulmonary embolus. 2. Spiculated left lower lobe nodule, stable from 03/25/2021 but new from 09/18/2020. Finding is worrisome for primary bronchogenic carcinoma. Outpatient consultation with pulmonary medicine or thoracic surgery is recommended. 3. Bilateral lower lobe airspace opacification, right much greater than left, worrisome for chronic/repeated aspiration. Pneumonia is not excluded. 4. Trace bilateral pleural effusions. 5. Aortic atherosclerosis (ICD10-I70.0). Coronary artery calcification. Electronically Signed   By: Leanna Battles M.D.   On: 05/08/2021 13:58   DG Chest Port 1 View  Result Date: 05/08/2021 CLINICAL DATA:  Questionable sepsis. EXAM: PORTABLE CHEST 1 VIEW COMPARISON:  April 30, 2021 FINDINGS: Tracheostomy tube stable position. Prior median sternotomy. The heart size and mediastinal contours are unchanged. Right basilar opacity similar in location to previously visualized right lower lobe pneumonia seen on CT March 25, 2021 and similar in appearance to radiograph April 30, 2021 likely reflecting resolving inflammation/infection however recurrent aspiration/infection not excluded. No visible pleural effusion or pneumothorax. The visualized skeletal structures are unchanged. IMPRESSION: Right basilar opacity similar in location to previously visualized right lower lobe pneumonia seen on CT March 25, 2021 and similar in appearance to  radiograph April 30, 2021 likely reflecting resolving inflammation/infection however recurrent aspiration/infection not excluded. Electronically Signed   By: Maudry Mayhew M.D.   On: 05/08/2021 11:51    Scheduled Meds:  acidophilus  1 capsule Oral Daily   albuterol  2.5 mg Nebulization Q6H   arformoterol  15 mcg Nebulization BID   budesonide (PULMICORT) nebulizer solution  0.5 mg Nebulization Q12H   chlorhexidine gluconate (MEDLINE KIT)  15 mL Mouth Rinse BID   Chlorhexidine Gluconate Cloth  6 each Topical Daily   clonazePAM  0.5 mg Per Tube BID   enoxaparin (LOVENOX) injection  40 mg Subcutaneous Q24H   escitalopram  10 mg Per Tube Daily   feeding supplement (PROSource TF)  45 mL Per Tube BID   fluticasone  1 spray Each Nare Daily   guaiFENesin  400 mg Per Tube TID   levETIRAcetam  500 mg Per Tube BID   levothyroxine  25 mcg Per Tube QAC breakfast   loratadine  10 mg Oral Daily   mouth rinse  15 mL Mouth Rinse 10 times per day   midodrine  10 mg Per Tube TID WC   multivitamin with minerals  1 tablet Per Tube Daily   pantoprazole sodium  40 mg Per Tube Daily   polyethylene glycol  17 g Per Tube QODAY   propranolol  10 mg Per Tube Daily   revefenacin  175 mcg Nebulization Daily   sodium chloride flush  10-40 mL Intracatheter Q12H   tamsulosin  0.4 mg Oral QHS   Continuous Infusions:  ceFEPime (MAXIPIME) IV 200 mL/hr at 05/09/21 1700   feeding supplement (JEVITY 1.5 CAL/FIBER) 1,000 mL (05/09/21 1425)   linezolid (ZYVOX) IV 600 mg (05/09/21 1004)   potassium PHOSPHATE IVPB (in mmol)      LOS: 1 day   Marguerita Merles, DO [facility] Available via Epic secure chat 7am-7pm After these hours, please refer  to coverage provider listed on [email or web address] 05/09/2021, 5:47 PM

## 2021-05-09 NOTE — Progress Notes (Addendum)
? ?NAME:  KAZI REPPOND, MRN:  [id number], DOB:  10-Jul-1940, LOS: 1 ?ADMISSION DATE:  05/08/2021, CONSULTATION DATE:  4/19 ?REFERRING MD:  Dr. Doren Custard, CHIEF COMPLAINT:  possible seizure; chronic trach vent dependent  ? 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 ? ?Patient is trached on long term mechanical ventilation. Resides at [facility]. 6XLT trach in place. Patient was recently admitted to [facility] on 4/11 with seizure activity.  Patient was loaded with Keppra. EEG without seizure activity. CT head/MRI no acute abnormality. Patient was discharged back to [facility] 4/12 on 500 keppra BID. ? ?Patient arrived to [facility] ED on 4/19 with similar seizure activity. He was having a 6 minute episode of staring off into space. Patient is now alert and oriented. Labs/CT head pending. Was having difficulty ventilating and concerned about trach position. ABG pH 7.26, co2 89, po2 499, bicarb 40. CXR showing RLL pneumonia which is similar in appearance from previous CXR on April 30, 2021. PCCM consulted for further evaluation of hypercarbic respiratory failure and also concern about tracheostomy placement/trach dislodgement.  ? ?Of note the patient's trach was initially partially out of the stoma prior to RT repositioning  ? ?Pertinent  Medical History  ?PAF  ?CAD  ?MVR/MVP  ?CHF  ?Essential Tremor  ?GIB  ?Osteopenia  ? ?Significant Hospital Events: ?Including procedures, antibiotic start and stop dates in addition to other pertinent events   ?4/19 Admit to [facility], possible seizure. Trach changed due to poor volumes on vent, suspected obstruction ? ?Interim History / Subjective:  ?Afebrile ?On vent - 30% fiO2, rate 15 ?Glucose range 101-127 ?Blood cultures pending ?I/O 1L UOP, +1.4L in last 24 hours ? ?Objective   ?Blood pressure  132/72, pulse (!) 57, temperature 98.3 ?F (36.8 ?C), temperature source Axillary, resp. rate 15, height 5\' 10"  (1.778 m), weight 56.7 kg, SpO2 96 %. ?   ?Vent Mode: PRVC ?FiO2 (%):  [30 %-100 %] 30 % ?Set Rate:  [15 bmp-30 bmp] 15 bmp ?Vt Set:  [580 mL] 580 mL ?PEEP:  [5 cmH20] 5 cmH20 ?Plateau Pressure:  [17 HUT65-46 cmH20] 17 cmH20  ? ?Intake/Output Summary (Last 24 hours) at 05/09/2021 0736 ?Last data filed at 05/09/2021 0700 ?Gross per 24 hour  ?Intake 2495 ml  ?Output 1000 ml  ?Net 1495 ml  ? ?Filed Weights  ? 05/08/21 1104  ?Weight: 56.7 kg  ? ? ?Examination: ?General: cachectic adult male lying in bed in NAD on vent  ?HEENT: MM pink/moist, #6XLT distal trach midline clean/dry/intact, large stoma noted ?Neuro: Awake/alert, interactive on vent, moves spontaneously  ?CV: s1s2 RRR, no m/r/g ?PULM: non-labored at rest, lungs bilaterally coarse with good air entry  ?GI: soft, bsx4 active, PEG in place  ?Extremities: warm/dry, no edema  ?Skin: no rashes or lesions ? ?Resolved Hospital Problem list   ? ? ?Assessment & Plan:  ?Principal Problem: ?  Acute metabolic encephalopathy ?Active Problems: ?  Essential tremor ?  Hypotension ?  Acute on chronic respiratory failure with hypoxia and hypercapnia (HCC) ?  Failure to thrive in adult ?  AF (paroxysmal atrial fibrillation) ([place]) ?  Protein-calorie malnutrition, severe ?  Obstructive sleep apnea ?  Chronic obstructive pulmonary disease (HCC) ?  Chronic heart failure with preserved ejection fraction ([place]) ?  Aspiration pneumonia ([place]) ?  Ventilator dependent ([place]) ?  Seizure ([place]) ?  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[place]) ?  Pneumonia ?  Hyperkalemia ?  UTI (urinary tract infection) ?  Encephalopathy ? ? ? ?Acute Hypercarbic Respiratory secondary Malpositioned Tracheostomy with Airway Obstruction  ?RLL PNA  ?Initially trach was dislodged and abutted the posterior wall of the trachea. Bronchoscopy evaluation bronchoscopy on admit warranted replacement of trach w/ a new one. Post  placement showed FOB with tip of the trach mid-line and 3 cm above the carina, good return of volumes on vent.  Baseline chronic vent dependent. CT on admission shows "debris in the lower trachea" & new findings of LLL spiculated mass.   Prior trach hx review notes that 6 Shiley and Bivona trach were not successful for patient.  ? ?Plan ?-PRVC 8cc/kg, rate 15 ?-Follow intermittent CXR  ?-Trach care per protocol  ?-Ensure trach ties are securely fixed, no more than two fingers width spacing ?-mepilex below trach phalange  ?-ABX, cefepime + zyvox, per primary  ?-adjust pulmicort dosing  ?-continue brovna, yupelri, PRN duoneb/albuterol  ? ?Spiculated LLL Nodule  ?-Nodules has tags to the adjacent pleura, measuring 8x12 mm.  ?-Will need to determine candidacy for further assessment of nodule, goals of care.  Recommend Palliative Care involvement as he would not likely be a candidate for therapy.  ? ? ? ?Best Practice (right click and "Reselect all SmartList Selections" daily)  ?Per primary  ? ?Critical care time: 31 minutes   ? ?Noe Gens, MSN, APRN, NP-C, AGACNP-BC ?[facility] ?05/09/2021, 7:36 AM ? ? ?Please see [email or web address] for pager details.  ? ?From 7A-7P if no response, please call [phone] ?After hours, please call Warren Lacy [phone] ? ? ? ?

## 2021-05-09 NOTE — Assessment & Plan Note (Signed)
-   Appreciate pulmonary evaluation and they did exchange his tracheostomy ?-See above further recommendations ?

## 2021-05-09 NOTE — Assessment & Plan Note (Addendum)
-   Continue full vent support and pulmonary recommends continuing #6 XLT distal tracheostomy with secure ties with 2 fingers breath tension on the ties ?They are also recommending continuing bronchodilators and recommending continuing treatment for HCAP ?-Pulmonary recommending PRVC at 8 cc/kg with a rate of 15 ?-They are also recommending following intermittent chest x-ray as well as trach care per protocol ?-Continue Austin Davis, and as needed DuoNeb and albuterol ?

## 2021-05-10 ENCOUNTER — Inpatient Hospital Stay (HOSPITAL_COMMUNITY): Payer: Medicare Other

## 2021-05-10 DIAGNOSIS — F419 Anxiety disorder, unspecified: Secondary | ICD-10-CM | POA: Diagnosis not present

## 2021-05-10 DIAGNOSIS — Z515 Encounter for palliative care: Secondary | ICD-10-CM | POA: Diagnosis not present

## 2021-05-10 DIAGNOSIS — N390 Urinary tract infection, site not specified: Secondary | ICD-10-CM | POA: Diagnosis not present

## 2021-05-10 DIAGNOSIS — E875 Hyperkalemia: Secondary | ICD-10-CM | POA: Diagnosis not present

## 2021-05-10 DIAGNOSIS — E43 Unspecified severe protein-calorie malnutrition: Secondary | ICD-10-CM | POA: Diagnosis not present

## 2021-05-10 DIAGNOSIS — I480 Paroxysmal atrial fibrillation: Secondary | ICD-10-CM | POA: Diagnosis not present

## 2021-05-10 DIAGNOSIS — J449 Chronic obstructive pulmonary disease, unspecified: Secondary | ICD-10-CM | POA: Diagnosis not present

## 2021-05-10 DIAGNOSIS — I5032 Chronic diastolic (congestive) heart failure: Secondary | ICD-10-CM | POA: Diagnosis not present

## 2021-05-10 DIAGNOSIS — R911 Solitary pulmonary nodule: Secondary | ICD-10-CM | POA: Diagnosis not present

## 2021-05-10 DIAGNOSIS — L89153 Pressure ulcer of sacral region, stage 3: Secondary | ICD-10-CM | POA: Diagnosis not present

## 2021-05-10 DIAGNOSIS — J9621 Acute and chronic respiratory failure with hypoxia: Secondary | ICD-10-CM | POA: Diagnosis not present

## 2021-05-10 DIAGNOSIS — R1310 Dysphagia, unspecified: Secondary | ICD-10-CM | POA: Diagnosis not present

## 2021-05-10 DIAGNOSIS — G4733 Obstructive sleep apnea (adult) (pediatric): Secondary | ICD-10-CM | POA: Diagnosis not present

## 2021-05-10 DIAGNOSIS — I5023 Acute on chronic systolic (congestive) heart failure: Secondary | ICD-10-CM | POA: Diagnosis not present

## 2021-05-10 DIAGNOSIS — Z9911 Dependence on respirator [ventilator] status: Secondary | ICD-10-CM | POA: Diagnosis not present

## 2021-05-10 DIAGNOSIS — G9341 Metabolic encephalopathy: Secondary | ICD-10-CM | POA: Diagnosis not present

## 2021-05-10 DIAGNOSIS — J690 Pneumonitis due to inhalation of food and vomit: Secondary | ICD-10-CM

## 2021-05-10 DIAGNOSIS — Z66 Do not resuscitate: Secondary | ICD-10-CM | POA: Diagnosis not present

## 2021-05-10 DIAGNOSIS — Z20822 Contact with and (suspected) exposure to covid-19: Secondary | ICD-10-CM | POA: Diagnosis not present

## 2021-05-10 DIAGNOSIS — R627 Adult failure to thrive: Secondary | ICD-10-CM | POA: Diagnosis not present

## 2021-05-10 DIAGNOSIS — J9620 Acute and chronic respiratory failure, unspecified whether with hypoxia or hypercapnia: Secondary | ICD-10-CM | POA: Diagnosis not present

## 2021-05-10 DIAGNOSIS — Z430 Encounter for attention to tracheostomy: Secondary | ICD-10-CM | POA: Diagnosis not present

## 2021-05-10 DIAGNOSIS — G894 Chronic pain syndrome: Secondary | ICD-10-CM | POA: Diagnosis not present

## 2021-05-10 DIAGNOSIS — G9340 Encephalopathy, unspecified: Secondary | ICD-10-CM | POA: Diagnosis not present

## 2021-05-10 DIAGNOSIS — R0602 Shortness of breath: Secondary | ICD-10-CM | POA: Diagnosis not present

## 2021-05-10 DIAGNOSIS — Z431 Encounter for attention to gastrostomy: Secondary | ICD-10-CM | POA: Diagnosis not present

## 2021-05-10 DIAGNOSIS — R569 Unspecified convulsions: Secondary | ICD-10-CM | POA: Diagnosis not present

## 2021-05-10 DIAGNOSIS — G250 Essential tremor: Secondary | ICD-10-CM | POA: Diagnosis not present

## 2021-05-10 DIAGNOSIS — J9503 Malfunction of tracheostomy stoma: Secondary | ICD-10-CM | POA: Diagnosis not present

## 2021-05-10 DIAGNOSIS — J9622 Acute and chronic respiratory failure with hypercapnia: Secondary | ICD-10-CM | POA: Diagnosis not present

## 2021-05-10 LAB — CBC WITH DIFFERENTIAL/PLATELET
Abs Immature Granulocytes: 0.02 10*3/uL (ref 0.00–0.07)
Basophils Absolute: 0 10*3/uL (ref 0.0–0.1)
Basophils Relative: 0 %
Eosinophils Absolute: 0.2 10*3/uL (ref 0.0–0.5)
Eosinophils Relative: 3 %
HCT: 28.5 % — ABNORMAL LOW (ref 39.0–52.0)
Hemoglobin: 9.3 g/dL — ABNORMAL LOW (ref 13.0–17.0)
Immature Granulocytes: 0 %
Lymphocytes Relative: 15 %
Lymphs Abs: 1 10*3/uL (ref 0.7–4.0)
MCH: 28.4 pg (ref 26.0–34.0)
MCHC: 32.6 g/dL (ref 30.0–36.0)
MCV: 87.2 fL (ref 80.0–100.0)
Monocytes Absolute: 0.8 10*3/uL (ref 0.1–1.0)
Monocytes Relative: 12 %
Neutro Abs: 4.6 10*3/uL (ref 1.7–7.7)
Neutrophils Relative %: 70 %
Platelets: 228 10*3/uL (ref 150–400)
RBC: 3.27 MIL/uL — ABNORMAL LOW (ref 4.22–5.81)
RDW: 16.9 % — ABNORMAL HIGH (ref 11.5–15.5)
WBC: 6.6 10*3/uL (ref 4.0–10.5)
nRBC: 0 % (ref 0.0–0.2)

## 2021-05-10 LAB — COMPREHENSIVE METABOLIC PANEL
ALT: 28 U/L (ref 0–44)
AST: 24 U/L (ref 15–41)
Albumin: 2.6 g/dL — ABNORMAL LOW (ref 3.5–5.0)
Alkaline Phosphatase: 82 U/L (ref 38–126)
Anion gap: 6 (ref 5–15)
BUN: 18 mg/dL (ref 8–23)
CO2: 27 mmol/L (ref 22–32)
Calcium: 8.3 mg/dL — ABNORMAL LOW (ref 8.9–10.3)
Chloride: 100 mmol/L (ref 98–111)
Creatinine, Ser: 0.5 mg/dL — ABNORMAL LOW (ref 0.61–1.24)
GFR, Estimated: >60 mL/min (ref >60)
Glucose, Bld: 134 mg/dL — ABNORMAL HIGH (ref 70–99)
Potassium: 3.6 mmol/L (ref 3.5–5.1)
Sodium: 133 mmol/L — ABNORMAL LOW (ref 135–145)
Total Bilirubin: 0.4 mg/dL (ref 0.3–1.2)
Total Protein: 5.4 g/dL — ABNORMAL LOW (ref 6.5–8.1)

## 2021-05-10 LAB — URINE CULTURE: Culture: >=100000 — AB

## 2021-05-10 LAB — RESP PANEL BY RT-PCR (FLU A&B, COVID) ARPGX2
Influenza A by PCR: NEGATIVE
Influenza B by PCR: NEGATIVE
SARS Coronavirus 2 by RT PCR: NEGATIVE

## 2021-05-10 LAB — PHOSPHORUS: Phosphorus: 2.8 mg/dL (ref 2.5–4.6)

## 2021-05-10 LAB — MAGNESIUM: Magnesium: 2.1 mg/dL (ref 1.7–2.4)

## 2021-05-10 LAB — GLUCOSE, CAPILLARY
Glucose-Capillary: 130 mg/dL — ABNORMAL HIGH (ref 70–99)
Glucose-Capillary: 101 mg/dL — ABNORMAL HIGH (ref 70–99)
Glucose-Capillary: 111 mg/dL — ABNORMAL HIGH (ref 70–99)

## 2021-05-10 LAB — PROCALCITONIN: Procalcitonin: <0.1 ng/mL

## 2021-05-10 MED ORDER — ALPRAZOLAM 0.5 MG PO TABS
0.5000 mg | ORAL_TABLET | Freq: Three times a day (TID) | ORAL | 0 refills | Status: DC | PRN
Start: 2021-05-10 — End: 2021-05-31

## 2021-05-10 MED ORDER — CEFEPIME IV (FOR PTA / DISCHARGE USE ONLY): 2.0000 g | Freq: Three times a day (TID) | INTRAVENOUS | 0 refills | Status: DC

## 2021-05-10 MED ORDER — PROSOURCE TF PO LIQD: 45.0000 mL | Freq: Two times a day (BID) | ORAL | Status: DC

## 2021-05-10 MED ORDER — ADULT MULTIVITAMIN W/MINERALS CH: 1.0000 | ORAL_TABLET | Freq: Every day | ORAL | Status: DC

## 2021-05-10 MED ORDER — LINEZOLID 600 MG PO TABS: 600.0000 mg | ORAL_TABLET | Freq: Two times a day (BID) | ORAL | 0 refills | Status: AC

## 2021-05-10 MED ORDER — IPRATROPIUM-ALBUTEROL 0.5-2.5 (3) MG/3ML IN SOLN: 3.0000 mL | RESPIRATORY_TRACT | Status: DC | PRN

## 2021-05-10 MED ORDER — ONDANSETRON HCL 4 MG PO TABS: 4.0000 mg | ORAL_TABLET | Freq: Four times a day (QID) | ORAL | 0 refills | Status: DC | PRN

## 2021-05-10 MED ORDER — CLONAZEPAM 0.5 MG PO TABS: 0.5000 mg | ORAL_TABLET | Freq: Two times a day (BID) | ORAL | 0 refills | Status: DC

## 2021-05-10 MED ORDER — LACTATED RINGERS IV BOLUS
250.0000 mL | Freq: Once | INTRAVENOUS | Status: AC
  Administered 2021-05-10: 250 mL via INTRAVENOUS

## 2021-05-10 MED ORDER — JEVITY 1.5 CAL/FIBER PO LIQD: 1000.0000 mL | ORAL | Status: DC

## 2021-05-10 NOTE — Progress Notes (Signed)
? ?NAME:  Austin Davis, MRN:  [id number], DOB:  06/23/1940, LOS: 2 ?ADMISSION DATE:  05/08/2021, CONSULTATION DATE:  4/19 ?REFERRING MD:  Dr. Doren Custard, CHIEF COMPLAINT:  possible seizure; chronic trach vent dependent  ? 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 ? ?Patient is trached on long term mechanical ventilation. Resides at [facility]. 6XLT trach in place. Patient was recently admitted to [facility] on 4/11 with seizure activity.  Patient was loaded with Keppra. EEG without seizure activity. CT head/MRI no acute abnormality. Patient was discharged back to [facility] 4/12 on 500 keppra BID. ? ?Patient arrived to [facility] ED on 4/19 with similar seizure activity. He was having a 6 minute episode of staring off into space. Patient is now alert and oriented. Labs/CT head pending. Was having difficulty ventilating and concerned about trach position. ABG pH 7.26, co2 89, po2 499, bicarb 40. CXR showing RLL pneumonia which is similar in appearance from previous CXR on April 30, 2021. PCCM consulted for further evaluation of hypercarbic respiratory failure and also concern about tracheostomy placement/trach dislodgement.  ? ?Of note the patient's trach was initially partially out of the stoma prior to RT repositioning  ? ?Pertinent  Medical History  ?PAF  ?CAD  ?MVR/MVP  ?CHF  ?Essential Tremor  ?GIB  ?Osteopenia  ? ?Significant Hospital Events: ?Including procedures, antibiotic start and stop dates in addition to other pertinent events   ?4/19 Admit to [facility], possible seizure. Trach changed due to poor volumes on vent, suspected obstruction ? ?Interim History / Subjective:  ?RN reports soft BP this am, has not had midodrine  ?Afebrile  ?Remains on vent / chronic ? ?Objective   ?Blood pressure (!) 78/45, pulse (!) 58, temperature  98.5 ?F (36.9 ?C), temperature source Oral, resp. rate 15, height 5\' 10"  (1.778 m), weight 55.6 kg, SpO2 92 %. ?   ?Vent Mode: PRVC ?FiO2 (%):  [30 %] 30 % ?Set Rate:  [15 bmp] 15 bmp ?Vt Set:  [580 mL] 580 mL ?PEEP:  [5 cmH20] 5 cmH20 ?Pressure Support:  [10 cmH20] 10 cmH20 ?Plateau Pressure:  [16 cmH20-24 cmH20] 16 cmH20  ? ?Intake/Output Summary (Last 24 hours) at 05/10/2021 0847 ?Last data filed at 05/10/2021 0700 ?Gross per 24 hour  ?Intake 3398.14 ml  ?Output 1927 ml  ?Net 1471.14 ml  ? ?Filed Weights  ? 05/08/21 1104 05/10/21 0419  ?Weight: 56.7 kg 55.6 kg  ? ? ?Examination: ?General: elderly, chronically ill appearing adult male lying in bed in NAD on vent  ?HEENT: MM pink/moist, #6 distal XLT trach midline clean/dry/intact  ?Neuro: awake/alert, interactive  ?CV: s1s2 RRR, no m/r/g ?PULM: non-labored at rest, lungs bilaterally clear with good air entry  ?GI: soft, bsx4 active  ?Extremities: warm/dry, no edema  ?Skin: no rashes or lesions ? ?Resolved Hospital Problem list   ? ? ?Assessment & Plan:  ?Principal Problem: ?  Acute metabolic encephalopathy ?Active Problems: ?  Essential tremor ?  Hypotension ?  Acute on chronic respiratory failure with hypoxia and hypercapnia (HCC) ?  Failure to thrive in adult ?  AF (paroxysmal atrial fibrillation) ([place]) ?  Protein-calorie malnutrition, severe ?  Obstructive sleep apnea ?  Chronic obstructive pulmonary disease (HCC) ?  Chronic heart failure with preserved ejection fraction ([place]) ?  Aspiration pneumonia ([place]) ?  Ventilator dependent ([place]) ?  Seizure ([place]) ?  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[place]) ?  Pneumonia ?  Hyperkalemia ?  UTI (urinary tract infection) ?  Encephalopathy ? ? ? ?Acute Hypercarbic Respiratory secondary Malpositioned Tracheostomy with Airway Obstruction  ?RLL PNA  ?Initially trach was dislodged and abutted the posterior wall of the trachea. Bronchoscopy evaluation bronchoscopy on admit warranted replacement of trach w/ a new one. Post placement showed FOB  with tip of the trach mid-line and 3 cm above the carina, good return of volumes on vent.  Baseline chronic vent dependent. CT on admission shows "debris in the lower trachea" & new findings of LLL spiculated mass.   Prior trach hx review notes that 6 Shiley and Bivona trach were not successful for patient.  ? ?Plan ?-PRVC 8cc/kg  ?-wean PEEP / fiO2 for sats >90% ?-follow intermittent CXR  ?-trach care per protocol  ?-keep trach ties securely fixed, no more than two finger width spacing  ?-mepilex below trach phalange ?-abx per primary for PNA ?-continue pulmicort, brovana, yupelri + PRN duoneb/albuterol ?-patient ok from pulmonary standpoint to return to [facility], defer to primary team  ? ?Spiculated LLL Nodule  ?Nodules has tags to the adjacent pleura, measuring 8x12 mm.  ?-recommend Palliative Care for goals of care ?-if spiculated nodule is malignant, there is little that can be offered given his advanced COPD and ventilator dependence.   ? ?Best Practice (right click and "Reselect all SmartList Selections" daily)  ?Per primary  ? ?Critical care time: 30 minutes   ? ?Noe Gens, MSN, APRN, NP-C, AGACNP-BC ?[facility] ?05/10/2021, 8:47 AM ? ? ?Please see [email or web address] for pager details.  ? ?From 7A-7P if no response, please call [phone] ?After hours, please call Warren Lacy [phone] ? ? ? ?

## 2021-05-10 NOTE — Discharge Summary (Addendum)
Physician Discharge Summary  Austin Davis VQQ:[id number] DOB: 30-Jun-1940 DOA: 05/08/2021  PCP: Austin Edelman, MD  Admit date: 05/08/2021 Discharge date: 05/10/2021  Admitted From: [facility]  Disposition: [facility]  Recommendations for Outpatient Follow-up:  Follow up with PCP in 1-2 weeks Follow up with Pulmonary in the outpatient setting Follow up with Palliative Care in the outpatient seetting Have Spiculated Left Lower Lung Mass followed as an outpatient with Oncology and appointment to be arranged as I spoke with Austin Davis.  Have an outpatient repeat CT Scan in 6 months to follow Spiculated Mass.  Repeat CXR in 3-6 weeks Please obtain CMP/CBC, Mag, Phso in one week Please follow up on the following pending results:  Home Health: No  Equipment/Devices: Trach and Vent    Discharge Condition: Stable  CODE STATUS: FULL CODE Diet recommendation: NPO  Brief/Interim Summary: Brief Narrative:  HPI per Austin Davis on 05/08/21 Austin Davis is a 81 y.o. 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   Symptoms started yesterday, LTAC staff noticed patient started to have episodes of eyes staring at sittings, less responsive to nursing staff and decreased extremity movement.  Patient was sent to the ED for evaluation.  Yesterday's ED work-up were benign and patient sent back to LTAC.  But overnight, appears that patient had recurrent similar episodes of staring, and also noticed the patient O2 saturation dropped to 80% and patient was sent back to ED.   ED Course: Afebrile, blood pressure borderline low no significant hypoxia but ABG showed new onset of respiratory acidosis 7.2 6/89/499, leukocytosis 17.6 compared to yesterday 11.   CT chest showed  right lower lobe infiltrates and consolidations, and urine appears to to be cloudy.  UA pending.  Patient was given vancomycin and cefepime in the ED.   **Interim History He continues to try and speak to [place] and tracheostomy is now position and has not had any further ventilation problems since his exchange of his tracheostomy tube yesterday.  The pulmonary team is concerned that he will have dislodgment issues with the tracheostomy again and they are recommending continue using that size tube with secure tracheostomy ties.  In addition he also has a spiculated nodule in left lobe with concern for cancer and they are recommending goals of care discussion and we will consult palliative care for further goals of care discussion. He is stable for D/C back to SNF from a Medical and Pulmonary Standpoint and needs continued outpatient Palliative Discussions.    Discharge Diagnoses:  Principal Problem:   Acute metabolic encephalopathy Active Problems:   Essential tremor   Hypotension   Acute on chronic respiratory failure with hypoxia and hypercapnia (HCC)   Failure to thrive in adult   AF (paroxysmal atrial fibrillation) (HCC)   Protein-calorie malnutrition, severe   Obstructive sleep apnea   Chronic obstructive pulmonary disease (HCC)   Chronic heart failure with preserved ejection fraction (HCC)   Aspiration pneumonia (HCC)   Ventilator dependent (HCC)   Seizure (HCC)   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HCC)   Pneumonia   Hyperkalemia   UTI (urinary tract infection)   Encephalopathy  Recurrent VRE UTI (urinary tract infection) -As above, most recent urine culture showed Pseudomonas and VRE, antibiotics coverage with Zyvox and cefepime and will continue for now -Urinalysis done and showed  turbid appearance with amber-colored urine, large leukocytes, negative nitrites, many bacteria, greater than 50 WBCs with urine culture showing VRE again   Hyperkalemia -> Hypokalemia -Secondary to acute acidosis,  normal creatinine level, recheck BMP tomorrow. 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  -Will need to determine candidacy for further assessment of nodule, goals of care.   -Palliative Care consulted given that Prognosis is Poor and can have outpatient follow up and continued Palliative Care Conversations and repeat CT Scan in 6 months to follow -Pulmonary Austin Davis feels as if this is cancer to the have little to offer given his advanced COPD and ventilator dependence -We will have outpatient oncology follow-up and spoke with Austin Davis to arrange the follow up and he is aware   Hypophosphatemia -Patient's Phos Level is 1.7 -Replete with IV K Phos 20 mmol -Continue to Monitor and Replete as Necessary -Repeat Phos Level in the AM   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[employer]) - Appreciate pulmonary evaluation and they did exchange his tracheostomy -See below further recommendations   Seizure [facility]) -Doubt this was a seizure and likely had hypoxemic related events.   Ventilator dependent ([place]) - Continue full vent support and pulmonary recommends continuing #6 XLT distal tracheostomy with secure ties with 2 fingers breath tension on the ties -They are also recommending continuing bronchodilators and recommending continuing treatment for HCAP -Pulmonary recommending PRVC at 8 cc/kg with a rate of 15 -They are also recommending following intermittent chest x-ray as well as trach care per protocol -Continue Austin Davis, and as needed DuoNeb and albuterol while hospitalized    Acute on chronic respiratory failure with hypoxia and hypercapnia (HCC) -Probably combined effect of acute hypercapnic respiratory failure/acute respiratory acidosis plus UTI. -CO2  retention likely secondary to new onset of right lower lobe pneumonia/aspiration pneumonia which is new compared to CT scanning done March 7. For now we will continue treatment with Zyvox to cover VRE UTI recent and cefepime to cover both pneumonia and UTI.  Continue n.p.o. and tube feeding. -UTI, probably failed outpatient treatment with still on Keflex until yesterday.  However urine culture from last month showed Pseudomonas and VRE, will change antibiotic coverage to Zyvox and cefepime  -Other Ddx, recurrent seizure less likely given significant improvement after IV fluids and antibiotics in the ED. -Trach-collar exchanged by critical care physician in the ED, repeat blood gas showed resolution of the hypercapnia.  Continue current ventilator setting, -Antibiotics as above -Repeat CXR today showed "Right-greater-than-left bibasilar airspace disease is probably unchanged but not well evaluated on  portable chest radiography due to its location. Known spiculated nodule in the left lower lobe is not visible either. No other evidence of acute chest process." -Repeat CXR in 3-6 weeks    Hyponatremia -Mild.  -Na+ went from 137 -> 133 -Continue to Monitor and Trend in the outpatient setting  Hypoalbuminemia -Patient's Albumin Level is now 2.6 -Continue to [place] and Trend in the outpatient setting  Normocytic Anemia/Anemia of Chronic Disease -Hgb/Hct is stable at 9.3/28.5 -Check Anemia Panel in the outpatient setting  -Repeat CBC within 1 week   Acute metabolic encephalopathy, improved -Probably combined effect of acute hypercapnic respiratory failure/acute respiratory acidosis plus UTI. -CO2 retention likely secondary to new onset of right lower  lobe pneumonia/aspiration pneumonia which is new compared to CT scanning done March 7.  For now we will continue treatment with Zyvox to cover VRE UTI recent and cefepime to cover both pneumonia and UTI.  Continue n.p.o. and tube feeding. -UTI,  probably failed outpatient treatment with still on Keflex until yesterday.  However urine culture from last month showed Pseudomonas and VRE, will change antibiotic coverage to Zyvox and cefepime -Other Ddx, recurrent seizure less likely given significant improvement after IV fluids and antibiotics in the ED. -Continue to Monitor and Delirium Precautions -C/w Abx     Seizure Disorder -As discussed above, will continue Keppra 500 mg twice daily   Chronic COPD -Lung exam not showing significant wheezing, continue current breathing treatment   Chronic systolic CHF -Appears to be euvolemic, hold off Lasix while hospitalized and resume at D/C   PAF status post MAZE -In sinus rhythm   Discharge Instructions  Discharge Instructions     [facility] pharmacist to adjust dose for Vancomycin, Aminoglycosides and other anti-infective therapies as requested by physician.   Complete by: As directed    [facility] to provide Cath Flo 79m   Complete by: As directed    Administer for PICC line occlusion and as ordered by physician for other access device issues.   Anaphylaxis Kit: Provided to treat any anaphylactic reaction to the medication being provided to the patient if First Dose or when requested by physician   Complete by: As directed    Epinephrine 152mml vial / amp: Administer 0.26m626m0.26ml80mubcutaneously once for moderate to severe anaphylaxis, nurse to call physician and pharmacy when reaction occurs and call 911 if needed for immediate care   Diphenhydramine 50mg34mIV vial: Administer 25-50mg 59mM PRN for first dose reaction, rash, itching, mild reaction, nurse to call physician and pharmacy when reaction occurs   Sodium Chloride 0.9% NS 500ml I526mdminister if needed for hypovolemic blood pressure drop or as ordered by physician after call to physician with anaphylactic reaction   Call MD for:  difficulty breathing, headache or visual disturbances   Complete by: As  directed    Call MD for:  hives   Complete by: As directed    Call MD for:  persistant dizziness or light-headedness   Complete by: As directed    Call MD for:  persistant nausea and vomiting   Complete by: As directed    Call MD for:  redness, tenderness, or signs of infection (pain, swelling, redness, odor or green/yellow discharge around incision site)   Complete by: As directed    Call MD for:  severe uncontrolled pain   Complete by: As directed    Call MD for:  temperature >100.4   Complete by: As directed    Change dressing on IV access line weekly and PRN   Complete by: As directed    Discharge instructions   Complete by: As directed    You were cared for by a hospitalist during your hospital stay. If you have any questions about your discharge medications or the care you received while you were in the hospital after you are discharged, you can call the unit and ask to speak with the hospitalist on call if the hospitalist that took care of you is not available. Once you are discharged, your primary care physician will handle any further medical issues. Please note that NO REFILLS for any discharge medications will be authorized once you are discharged, as it is imperative that  you return to your primary care physician (or establish a relationship with a primary care physician if you do not have one) for your aftercare needs so that they can reassess your need for medications and monitor your lab values.  Follow up with PCP, Pulmonary, and Palliative Care in the outpatient setting. Take all medications as prescribed. If symptoms change or worsen please return to the ED for evaluation   Discharge wound care:   Complete by: As directed    Frequent Turning   Flush IV access with Sodium Chloride 0.9% and Heparin 10 units/ml or 100 units/ml   Complete by: As directed    Home infusion instructions - [facility]   Complete by: As directed    Instructions: Flush IV access with  Sodium Chloride 0.9% and Heparin 10units/ml or 100units/ml   Change dressing on IV access line: Weekly and PRN   Instructions Cath Flo 44m: Administer for PICC Line occlusion and as ordered by physician for other access device   [facility] pharmacist to adjust dose for: Vancomycin, Aminoglycosides and other anti-infective therapies as requested by physician   Increase activity slowly   Complete by: As directed    Method of administration may be changed at the discretion of home infusion pharmacist based upon assessment of the patient and/or caregiver's ability to self-administer the medication ordered   Complete by: As directed       Allergies as of 05/10/2021       Reactions   Prednisone Other (See Comments)   Makes skin crawl, rapid HR   Cortisone Palpitations        Medication List     STOP taking these medications    cephALEXin 250 MG/5ML suspension Commonly known as: KEFLEX       TAKE these medications    acetaminophen 650 MG CR tablet Commonly known as: TYLENOL Take 650 mg by mouth every 8 (eight) hours as needed for pain. Via tube   ALPRAZolam 0.5 MG tablet Commonly known as: XANAX Place 1 tablet (0.5 mg total) into feeding tube every 8 (eight) hours as needed for anxiety.   ceFEPime  IVPB Commonly known as: MAXIPIME Inject 2 g into the vein every 8 (eight) hours. Indication:  HAP First Dose: No Last Day of Therapy:  05/14/21 Labs - Once weekly:  CBC/D and BMP, Labs - Every other week:  ESR and CRP Method of administration: IV Push Method of administration may be changed at the discretion of home infusion pharmacist based upon assessment of the patient and/or caregiver's ability to self-administer the medication ordered.   clonazePAM 0.5 MG tablet Commonly known as: KLONOPIN Place 1 tablet (0.5 mg total) into feeding tube 2 (two) times daily.   Culturelle Caps Give 1 capsule by tube daily.   escitalopram 10 MG tablet Commonly known as:  LEXAPRO Place 10 mg into feeding tube daily.   esomeprazole 40 MG packet Commonly known as: NEXIUM Take 40 mg by mouth daily. Per G tube   feeding supplement (JEVITY 1.5 CAL/FIBER) Liqd Place 1,000 mLs into feeding tube continuous.   feeding supplement (PROSource TF) liquid Place 45 mLs into feeding tube 2 (two) times daily.   fexofenadine 180 MG tablet Commonly known as: ALLEGRA Place 180 mg into feeding tube daily.   fluticasone 50 MCG/ACT nasal spray Commonly known as: FLONASE Place 1 spray into both nostrils in the morning and at bedtime.   folic acid 1 MG tablet Commonly known as: FOLVITE Take 1 tablet (  1 mg total) by mouth daily. What changed: how to take this   furosemide 20 MG tablet Commonly known as: LASIX Place 1 tablet (20 mg total) into feeding tube daily as needed for fluid or edema.   guaiFENesin 100 MG/5ML liquid Commonly known as: ROBITUSSIN Place 400 mg into feeding tube 3 (three) times daily.   hydrOXYzine 25 MG tablet Commonly known as: ATARAX Place 25 mg into feeding tube every 6 (six) hours as needed for anxiety.   ipratropium-albuterol 0.5-2.5 (3) MG/3ML Soln Commonly known as: DUONEB Take 3 mLs by nebulization every 4 (four) hours as needed. What changed:  when to take this reasons to take this   lactulose 10 GM/15ML solution Commonly known as: CHRONULAC Place 20 g into feeding tube 2 (two) times daily as needed for mild constipation.   levalbuterol 1.25 MG/3ML nebulizer solution Commonly known as: XOPENEX Take 1.25 mg by nebulization every 6 (six) hours. For 30 days starting on 03-23-21   levETIRAcetam 100 MG/ML solution Commonly known as: Keppra Place 7.5 mLs (750 mg total) into feeding tube 2 (two) times daily. What changed: how much to take   levothyroxine 25 MCG tablet Commonly known as: SYNTHROID Place 25 mcg into feeding tube daily before breakfast.   linezolid 600 MG tablet Commonly known as: Zyvox Take 1 tablet (600 mg  total) by mouth 2 (two) times daily for 5 days.   midodrine 10 MG tablet Commonly known as: PROAMATINE Place 1 tablet (10 mg total) into feeding tube 3 (three) times daily with meals.   multivitamin with minerals Tabs tablet Place 1 tablet into feeding tube daily. Start taking on: May 11, 2021 What changed: how to take this   ondansetron 4 MG tablet Commonly known as: ZOFRAN Take 1 tablet (4 mg total) by mouth every 6 (six) hours as needed for nausea.   polyethylene glycol powder 17 GM/SCOOP powder Commonly known as: GLYCOLAX/MIRALAX Place 17 g into feeding tube every other day.   propranolol 10 MG tablet Commonly known as: INDERAL Place 1 tablet (10 mg total) into feeding tube daily as needed (for tremors in AM. do not administer if heart rate is below 60 or systolic blood pressure is below 90.). What changed: when to take this   PROSTAT PO Give 30 mLs by tube in the morning and at bedtime.   sodium chloride 0.65 % Soln nasal spray Commonly known as: OCEAN Place 1 spray into both nostrils every 4 (four) hours as needed for congestion.   Super Omega 3 EPA/DHA 1000 MG Caps Give 1,000 mg by tube daily.   tamsulosin 0.4 MG Caps capsule Commonly known as: FLOMAX Take 0.4 mg by mouth at bedtime.   vitamin C 500 MG tablet Commonly known as: ASCORBIC ACID Place 500 mg into feeding tube daily.   Vitamin D3 Super Strength 50 MCG (2000 UT) Tabs Generic drug: Cholecalciferol Give 2,000 Units by tube daily.   zinc gluconate 50 MG tablet Place 50 mg into feeding tube 2 (two) times daily.               Discharge Care Instructions  (From admission, onward)           Start     Ordered   05/10/21 0000  Change dressing on IV access line weekly and PRN  (Home infusion instructions - [facility] )        05/10/21 1346   05/10/21 0000  Discharge wound care:  Comments: Frequent Turning   05/10/21 1346            Allergies  Allergen Reactions    Prednisone Other (See Comments)    Makes skin crawl, rapid HR   Cortisone Palpitations   Consultations: Pulmonary PCCM  Procedures/Studies: X-ray chest PA and lateral  Result Date: 04/30/2021 CLINICAL DATA:  Seizures. EXAM: CHEST - 2 VIEW COMPARISON:  April 29, 2021 FINDINGS: Multiple sternal wires are noted. Stable tracheostomy tube positioning is seen. There is mild, stable elevation of the right hemidiaphragm with mild right basilar atelectasis. There is no evidence of a pleural effusion or pneumothorax. The heart size and mediastinal contours are within normal limits. The visualized skeletal structures are unremarkable. IMPRESSION: Mild, stable right basilar atelectasis. Electronically Signed   By: Virgina Norfolk M.D.   On: 04/30/2021 00:36   CT Head Wo Contrast  Result Date: 05/08/2021 CLINICAL DATA:  Mental status change, recurrent seizures EXAM: CT HEAD WITHOUT CONTRAST TECHNIQUE: Contiguous axial images were obtained from the base of the skull through the vertex without intravenous contrast. RADIATION DOSE REDUCTION: This exam was performed according to the departmental dose-optimization program which includes automated exposure control, adjustment of the mA and/or kV according to patient size and/or use of iterative reconstruction technique. COMPARISON:  04/28/2021 FINDINGS: Brain: No evidence of acute infarction, hemorrhage, extra-axial collection, ventriculomegaly, or mass effect. Generalized cerebral atrophy. Periventricular white matter low attenuation likely secondary to microangiopathy. Vascular: Cerebrovascular atherosclerotic calcifications are noted. Skull: Negative for fracture or focal lesion. Sinuses/Orbits: Visualized portions of the orbits are unremarkable. Visualized portions of the paranasal sinuses are unremarkable. Visualized portions of the mastoid air cells are unremarkable. Other: None. IMPRESSION: 1. No acute intracranial findings. 2. Chronic small vessel ischemic  changes. Electronically Signed   By: Kathreen Devoid M.D.   On: 05/08/2021 13:51   CT Head Wo Contrast  Result Date: 04/28/2021 CLINICAL DATA:  Mental status change EXAM: CT HEAD WITHOUT CONTRAST TECHNIQUE: Contiguous axial images were obtained from the base of the skull through the vertex without intravenous contrast. RADIATION DOSE REDUCTION: This exam was performed according to the departmental dose-optimization program which includes automated exposure control, adjustment of the mA and/or kV according to patient size and/or use of iterative reconstruction technique. COMPARISON:  CT brain 10/06/2020 FINDINGS: Brain: No acute territorial infarction, hemorrhage, or intracranial mass. Mild parenchymal atrophy and chronic small vessel ischemic changes of the white matter. Stable ventricle size and morphology. Vascular: No hyperdense vessels. Scattered carotid vascular calcification Skull: Normal. Negative for fracture or focal lesion. Sinuses/Orbits: No acute finding. Other: Chronic nasal bone deformity IMPRESSION: 1. No CT evidence for acute intracranial abnormality. 2. Atrophy and chronic small vessel ischemic changes of the white matter Electronically Signed   By: Donavan Foil M.D.   On: 04/28/2021 23:00   CT Angio Chest PE W and/or Wo Contrast  Result Date: 05/08/2021 CLINICAL DATA:  Seizure, diaphoresis. Evaluate for pulmonary embolus. EXAM: CT ANGIOGRAPHY CHEST WITH CONTRAST TECHNIQUE: Multidetector CT imaging of the chest was performed using the standard protocol during bolus administration of intravenous contrast. Multiplanar CT image reconstructions and MIPs were obtained to evaluate the vascular anatomy. RADIATION DOSE REDUCTION: This exam was performed according to the departmental dose-optimization program which includes automated exposure control, adjustment of the mA and/or kV according to patient size and/or use of iterative reconstruction technique. CONTRAST:  74m OMNIPAQUE IOHEXOL 350 MG/ML  SOLN COMPARISON:  03/25/2021, 10/25/2020, 09/18/2020. FINDINGS: Cardiovascular: Negative for pulmonary embolus. Atherosclerotic calcification of the aorta,  aortic valve and coronary arteries. Heart size normal. Left ventricle appears hypertrophied. No pericardial effusion. Mediastinum/Nodes: No pathologically enlarged mediastinal, hilar or axillary lymph nodes. Esophagus is grossly unremarkable. Lungs/Pleura: Tracheostomy is in place. Debris is seen in the lower trachea. Biapical pleuroparenchymal scarring. Spiculated left lower lobe nodule has tags to the adjacent pleura, measuring 8 x 12 mm (8/96) and is stable from 03/25/2021 but new from 09/18/2020. Peribronchovascular ground-glass nodularity and nodular consolidation in the right lower lobe. Minimal peribronchovascular ground-glass nodularity in the left lower lobe. Dependent volume loss in the lower lobes bilaterally. Trace bilateral pleural effusions. Upper Abdomen: Visualized portions of the liver, adrenal glands, right kidney, spleen, pancreas, stomach and bowel are grossly unremarkable. Musculoskeletal: Degenerative changes in the spine. Old thoracolumbar compression deformities. No worrisome lytic or sclerotic lesions. Review of the MIP images confirms the above findings. IMPRESSION: 1. Negative for pulmonary embolus. 2. Spiculated left lower lobe nodule, stable from 03/25/2021 but new from 09/18/2020. Finding is worrisome for primary bronchogenic carcinoma. Outpatient consultation with pulmonary medicine or thoracic surgery is recommended. 3. Bilateral lower lobe airspace opacification, right much greater than left, worrisome for chronic/repeated aspiration. Pneumonia is not excluded. 4. Trace bilateral pleural effusions. 5. Aortic atherosclerosis (ICD10-I70.0). Coronary artery calcification. Electronically Signed   By: Lorin Picket M.D.   On: 05/08/2021 13:58   MR ANGIO HEAD WO CONTRAST  Result Date: 04/30/2021 CLINICAL DATA:  Seizure, new-onset,  no history of trauma; Seizure disorder, clinical change; Stroke, follow up EXAM: MRI HEAD WITHOUT CONTRAST MRA HEAD WITHOUT CONTRAST MRA NECK WITHOUT CONTRAST TECHNIQUE: Multiplanar, multiecho pulse sequences of the brain and surrounding structures were obtained without intravenous contrast. Angiographic images of the Circle of Willis were obtained using MRA technique without intravenous contrast. Angiographic images of the neck were obtained using MRA technique without intravenous contrast. Carotid stenosis measurements (when applicable) are obtained utilizing NASCET criteria, using the distal internal carotid diameter as the denominator. COMPARISON:  None. FINDINGS: MRI HEAD Motion artifact is present. Brain: There is no acute infarction or intracranial hemorrhage. There is no intracranial mass, mass effect, or edema. There is no hydrocephalus or extra-axial fluid collection. Prominence of the ventricles and sulci reflects parenchymal volume loss. Patchy foci of T2 hyperintensity in the supratentorial white matter are nonspecific but may reflect mild chronic microvascular ischemic changes. Vascular: Major vessel flow voids at the skull base are preserved. Skull and upper cervical spine: Normal marrow signal is preserved. Sinuses/Orbits: Paranasal sinuses are aerated. Orbits are unremarkable. Other: Sella is unremarkable.  Mastoid air cells are clear. MRA HEAD Intracranial internal carotid arteries are patent. There is a 1 mm inferiorly directed outpouching from the distal supraclinoid right ICA. Middle and anterior cerebral arteries are patent. Intracranial vertebral arteries, basilar artery, posterior cerebral arteries are patent. There is no significant stenosis. MRA NECK Common, internal, and external carotid arteries are patent. Codominant extracranial vertebral arteries are patent. No stenosis. IMPRESSION: No acute infarction, hemorrhage, or mass. Mild chronic microvascular ischemic changes. No proximal  intracranial vessel occlusion or significant stenosis. 1 mm outpouching from the distal supraclinoid right ICA may reflect a small aneurysm or infundibulum at the origin of an unseen small vessel. No occlusion or hemodynamically significant stenosis in the neck. Electronically Signed   By: Macy Mis M.D.   On: 04/30/2021 17:19   MR ANGIO NECK WO CONTRAST  Result Date: 04/30/2021 CLINICAL DATA:  Seizure, new-onset, no history of trauma; Seizure disorder, clinical change; Stroke, follow up EXAM: MRI HEAD WITHOUT CONTRAST MRA HEAD  WITHOUT CONTRAST MRA NECK WITHOUT CONTRAST TECHNIQUE: Multiplanar, multiecho pulse sequences of the brain and surrounding structures were obtained without intravenous contrast. Angiographic images of the Circle of Willis were obtained using MRA technique without intravenous contrast. Angiographic images of the neck were obtained using MRA technique without intravenous contrast. Carotid stenosis measurements (when applicable) are obtained utilizing NASCET criteria, using the distal internal carotid diameter as the denominator. COMPARISON:  None. FINDINGS: MRI HEAD Motion artifact is present. Brain: There is no acute infarction or intracranial hemorrhage. There is no intracranial mass, mass effect, or edema. There is no hydrocephalus or extra-axial fluid collection. Prominence of the ventricles and sulci reflects parenchymal volume loss. Patchy foci of T2 hyperintensity in the supratentorial white matter are nonspecific but may reflect mild chronic microvascular ischemic changes. Vascular: Major vessel flow voids at the skull base are preserved. Skull and upper cervical spine: Normal marrow signal is preserved. Sinuses/Orbits: Paranasal sinuses are aerated. Orbits are unremarkable. Other: Sella is unremarkable.  Mastoid air cells are clear. MRA HEAD Intracranial internal carotid arteries are patent. There is a 1 mm inferiorly directed outpouching from the distal supraclinoid right ICA.  Middle and anterior cerebral arteries are patent. Intracranial vertebral arteries, basilar artery, posterior cerebral arteries are patent. There is no significant stenosis. MRA NECK Common, internal, and external carotid arteries are patent. Codominant extracranial vertebral arteries are patent. No stenosis. IMPRESSION: No acute infarction, hemorrhage, or mass. Mild chronic microvascular ischemic changes. No proximal intracranial vessel occlusion or significant stenosis. 1 mm outpouching from the distal supraclinoid right ICA may reflect a small aneurysm or infundibulum at the origin of an unseen small vessel. No occlusion or hemodynamically significant stenosis in the neck. Electronically Signed   By: Macy Mis M.D.   On: 04/30/2021 17:19   MR BRAIN WO CONTRAST  Result Date: 04/30/2021 CLINICAL DATA:  Seizure, new-onset, no history of trauma; Seizure disorder, clinical change; Stroke, follow up EXAM: MRI HEAD WITHOUT CONTRAST MRA HEAD WITHOUT CONTRAST MRA NECK WITHOUT CONTRAST TECHNIQUE: Multiplanar, multiecho pulse sequences of the brain and surrounding structures were obtained without intravenous contrast. Angiographic images of the Circle of Willis were obtained using MRA technique without intravenous contrast. Angiographic images of the neck were obtained using MRA technique without intravenous contrast. Carotid stenosis measurements (when applicable) are obtained utilizing NASCET criteria, using the distal internal carotid diameter as the denominator. COMPARISON:  None. FINDINGS: MRI HEAD Motion artifact is present. Brain: There is no acute infarction or intracranial hemorrhage. There is no intracranial mass, mass effect, or edema. There is no hydrocephalus or extra-axial fluid collection. Prominence of the ventricles and sulci reflects parenchymal volume loss. Patchy foci of T2 hyperintensity in the supratentorial white matter are nonspecific but may reflect mild chronic microvascular ischemic  changes. Vascular: Major vessel flow voids at the skull base are preserved. Skull and upper cervical spine: Normal marrow signal is preserved. Sinuses/Orbits: Paranasal sinuses are aerated. Orbits are unremarkable. Other: Sella is unremarkable.  Mastoid air cells are clear. MRA HEAD Intracranial internal carotid arteries are patent. There is a 1 mm inferiorly directed outpouching from the distal supraclinoid right ICA. Middle and anterior cerebral arteries are patent. Intracranial vertebral arteries, basilar artery, posterior cerebral arteries are patent. There is no significant stenosis. MRA NECK Common, internal, and external carotid arteries are patent. Codominant extracranial vertebral arteries are patent. No stenosis. IMPRESSION: No acute infarction, hemorrhage, or mass. Mild chronic microvascular ischemic changes. No proximal intracranial vessel occlusion or significant stenosis. 1 mm outpouching from the distal  supraclinoid right ICA may reflect a small aneurysm or infundibulum at the origin of an unseen small vessel. No occlusion or hemodynamically significant stenosis in the neck. Electronically Signed   By: Macy Mis M.D.   On: 04/30/2021 17:19   DG CHEST PORT 1 VIEW  Result Date: 05/10/2021 CLINICAL DATA:  Encounter for shortness of breath. EXAM: PORTABLE CHEST 1 VIEW COMPARISON:  CTA chest and portable chest both 05/08/2021. FINDINGS: 4:34 a.m., 05/10/2021. Tracheostomy cannula tip remains 3.6 cm from the carina. There is overlying monitor wiring. Intact median sternotomy sutures. Stable mediastinum with aortic atherosclerosis, tortuosity and ectasia. The cardiac size is normal. Bilateral basal airspace disease is probably not significantly changed but not well evaluated with portable chest radiograph atrophy because the majority of was behind the hemidiaphragms, with asymmetric elevation of the right diaphragm. 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 Left lower lobe spiculated nodule  noted on CT is not visible on this x-ray. IMPRESSION: Right-greater-than-left bibasilar airspace disease is probably unchanged but not well evaluated on portable chest radiography due to its location. Known spiculated nodule in the left lower lobe is not visible either. No other evidence of acute chest process. Electronically Signed   By: Telford Nab M.D.   On: 05/10/2021 06:42   DG Chest Port 1 View  Result Date: 05/08/2021 CLINICAL DATA:  Questionable sepsis. EXAM: PORTABLE CHEST 1 VIEW COMPARISON:  April 30, 2021 FINDINGS: Tracheostomy tube stable position. Prior median sternotomy. The heart size and mediastinal contours are unchanged. Right basilar opacity similar in location to previously visualized right lower lobe pneumonia seen on CT March 25, 2021 and similar in appearance to radiograph April 30, 2021 likely reflecting resolving inflammation/infection however recurrent aspiration/infection not excluded. No visible pleural effusion or pneumothorax. The visualized skeletal structures are unchanged. IMPRESSION: Right basilar opacity similar in location to previously visualized right lower lobe pneumonia seen on CT March 25, 2021 and similar in appearance to radiograph April 30, 2021 likely reflecting resolving inflammation/infection however recurrent aspiration/infection not excluded. Electronically Signed   By: Dahlia Bailiff M.D.   On: 05/08/2021 11:51   DG Chest Port 1 View  Result Date: 04/29/2021 CLINICAL DATA:  Altered mental status. EXAM: PORTABLE CHEST 1 VIEW COMPARISON:  April 28, 2021. FINDINGS: The heart size and mediastinal contours are within normal limits. Tracheostomy is unchanged. Sternotomy wires are noted. Left lung is clear. Minimal right basilar subsegmental atelectasis or infiltrate is noted. The visualized skeletal structures are unremarkable. IMPRESSION: Stable tracheostomy. Minimal right basilar subsegmental atelectasis or infiltrate. Electronically Signed   By: Marijo Conception  M.D.   On: 04/29/2021 18:25   DG Chest Port 1 View  Result Date: 04/28/2021 CLINICAL DATA:  weakness EXAM: PORTABLE CHEST 1 VIEW COMPARISON:  Chest x-ray 04/13/2021, chest x-ray 04/11/2021 FINDINGS: Tracheostomy. The heart and mediastinal contours are unchanged. Aortic calcification. No focal consolidation. Coarsened markings with no overt pulmonary edema. No pleural effusion. No pneumothorax. No acute osseous abnormality. IMPRESSION: 1. No active disease. 2.  Aortic Atherosclerosis (ICD10-I70.0). Electronically Signed   By: Iven Finn M.D.   On: 04/28/2021 21:19   DG Chest Port 1 View  Result Date: 04/13/2021 CLINICAL DATA:  Oxygen desaturation.  Follow-up study. EXAM: PORTABLE CHEST 1 VIEW COMPARISON:  04/12/2021 and earlier exams. FINDINGS: Since the previous day's exam, the right-sided chest tube has been removed. No pneumothorax. Opacity at the right lung base is stable. Minimal opacity at the left lung base is also unchanged. No new lung abnormalities.  Stable well-positioned tracheostomy tube. Tip of an apparent right PICC projects over the right axilla. This is stable compared to the earlier exam from yesterday. IMPRESSION: 1. Status post removal of the right-sided chest tube. 2. No pneumothorax. No other change from the most recent prior exam. Electronically Signed   By: Lajean Manes M.D.   On: 04/13/2021 08:06   DG CHEST PORT 1 VIEW  Result Date: 04/12/2021 CLINICAL DATA:  59-year-old male with desaturation, history of pneumonia. EXAM: PORTABLE CHEST - 1 VIEW COMPARISON:  Earlier the same day FINDINGS: Apical projection. The mediastinal silhouette is partially obscured due to technical factors, however appears similar to comparison. Tracheostomy cannula is in place. Right pigtail thoracostomy tube remains in place projecting over the basal right hemithorax. Similar appearing hazy opacities in the right lung base, slightly more radiopaque than comparison, though likely secondary to technical  factors given differences in technique. No new focal consolidations. No evidence of significant pleural effusion or evidence of pneumothorax No acute osseous abnormality. IMPRESSION: 1. Similar appearing right basilar opacity in keeping with history of pneumonia. No new acute findings to explain desaturation. 2. Unchanged position of right pigtail thoracostomy tube and tracheostomy cannula that evidence of pneumothorax or significant pleural effusion. Electronically Signed   By: Ruthann Cancer M.D.   On: 04/12/2021 15:21   DG CHEST PORT 1 VIEW  Result Date: 04/12/2021 CLINICAL DATA:  81 year old male with history of pneumothorax. EXAM: PORTABLE CHEST 1 VIEW COMPARISON:  Chest x-ray 04/11/2021. FINDINGS: A tracheostomy tube is in place with tip 4.5 cm above the carina. Right sided small bore chest tube in position with pigtail reformed over the right mid to lower hemithorax. Trace residual right-sided pneumothorax. Patchy airspace consolidation is noted, most evident in the right lung base, increased compared to the prior study, presumably in the right lower lobe (though difficult to localize given the lack of obscuration of the right heart border or right hemidiaphragm). Some patchy consolidation is also noted to a lesser extent in the left lower lobe. No pleural effusions. No evidence of pulmonary edema. Heart size is normal. Upper mediastinal contours are within normal limits. Atherosclerotic calcifications are noted in the thoracic aorta. Status post median sternotomy. IMPRESSION: 1. Postoperative changes and support apparatus, as above. 2. Trace residual right pneumothorax. 3. Worsening airspace consolidation, particularly in the right lower lobe. Electronically Signed   By: Vinnie Langton M.D.   On: 04/12/2021 05:14   DG CHEST PORT 1 VIEW  Result Date: 04/11/2021 CLINICAL DATA:  Pneumothorax EXAM: PORTABLE CHEST 1 VIEW COMPARISON:  04/11/2021 at 3:18 p.m. FINDINGS: 2 frontal views of the chest  demonstrate repositioning of the pigtail drainage catheter, overlying the lateral right lung base. There is a trace residual right apical pneumothorax, decreased in size since prior study with pleural separation measuring less than 2 mm. Volume estimated far less than 5%. Rounded consolidation at the right lung base, with overall improved aeration since prior study. No evidence of pleural effusion. Stable tracheostomy tube. IMPRESSION: 1. Further decrease in the right apical pneumothorax seen previously, with pleural separation now measuring less than 2 mm. 2. Improving aeration at the right lung base, with residual rounded consolidation possibly representing atelectasis. Electronically Signed   By: Randa Ngo M.D.   On: 04/11/2021 18:11   DG CHEST PORT 1 VIEW  Result Date: 04/11/2021 CLINICAL DATA:  Oxygen desaturation. EXAM: PORTABLE CHEST 1 VIEW COMPARISON:  AP chest 04/11/2021 and 03/26/2021 FINDINGS: Interval placement of inferior right hemithorax pigtail chest  tube with interval improvement in the previously seen right apical pneumothorax. This measures up to approximately 12 mm in craniocaudal dimension compared to 30 mm previously. Tracheostomy tube overlies the midline trachea. The patient is mildly rightward rotated. Status post median sternotomy. Heart size is not well assessed but grossly unchanged and likely within normal limits. Tortuous thoracic aorta is again noted. Moderate elevation of the right hemidiaphragm with right basilar linear likely subsegmental atelectasis in the region of the chest tube. The left lung is clear. No acute skeletal abnormality. IMPRESSION: Interval placement of right lower hemithorax chest tube and interval improvement in the previously seen right apical pneumothorax, now small. Electronically Signed   By: Yvonne Kendall M.D.   On: 04/11/2021 15:32   DG Chest Port 1 View  Result Date: 04/11/2021 CLINICAL DATA:  Pneumonia Pneumothorax? Desaturating EXAM: PORTABLE  CHEST - 1 VIEW COMPARISON:  03/26/2021 FINDINGS: Cardiomediastinal silhouette and pulmonary vasculature are within normal limits. Postop changes median sternotomy again seen. Interval increase of right medial lung base opacity suspicious for atelectasis of the right middle lobe. Moderate size right pneumothorax is now present. Tracheostomy cannula in appropriate position. IMPRESSION: 1. Moderate sized right pneumothorax. 2. Worsening atelectasis of right middle lobe. Electronically Signed   By: Miachel Roux M.D.   On: 04/11/2021 12:15   Overnight EEG with video  Result Date: 04/30/2021 Lora Havens, MD     05/01/2021  9:54 AM Patient Name: Austin Davis MRN: [id number] Epilepsy Attending: Lora Havens Referring Physician/Provider: Greta Doom, MD Duration: 04/30/2021 0500 to 05/01/2021 0500 Patient history: 81 year old male with three episodes of gaze deviation and decreased responsiveness.  EEG to evaluate for seizure. Level of alertness: Awake, asleep AEDs during EEG study: LEV, Clonazepam Technical aspects: This EEG study was done with scalp electrodes positioned according to the 10-20 International system of electrode placement. Electrical activity was acquired at a sampling rate of _0  and reviewed with a high frequency filter of _1  and a low frequency filter of _2 . EEG data were recorded continuously and digitally stored. Description: The posterior dominant rhythm consists of 8-9 Hz activity of moderate voltage (25-35 uV) seen predominantly in posterior head regions, symmetric and reactive to eye opening and eye closing. Sleep was characterized by vertex waves, sleep spindles (12 to 14 Hz), maximal frontocentral region. Hyperventilation and photic stimulation were not performed.   Patient was noted to have multiple episodes of mild tremor.  Concomitant EEG before, during and after the event did not show any EEG change to suggest seizure. IMPRESSION: This study is within normal  limits. No seizures or epileptiform discharges were seen throughout the recording. Multiple episodes of mild tremor were recorded without concomitant EEG change.  These episodes were nonepileptic. Priyanka Barbra Sarks     Subjective: Seen And examined at bedside he is improved and felt okay.  Unable to verbalize but denies any pain.  No other concerns or close this time is medically stable for discharge at this time and back on the vent.  Discharge Exam: Vitals:   05/10/21 1300 05/10/21 1315  BP: 104/72 117/67  Pulse: 67 64  Resp: 17 18  Temp:    SpO2: 95% 95%   Vitals:   05/10/21 1230 05/10/21 1245 05/10/21 1300 05/10/21 1315  BP: 97/60 95/61 104/72 117/67  Pulse: 62 62 67 64  Resp: _3 Temp:      TempSrc:      SpO2: 96% 96% 95% 95%  Weight:  Height:       General: Thin chronically ill-appearing elderly Caucasian male currently in no acute distress back on the vent  cardiovascular: RRR, S1/S2 +, no rubs, no gallops Respiratory: Diminished bilaterally, with slight rhonchi but no appreciable wheezing.  On the vent connected to his tracheostomy Abdominal: Soft, NT, ND, PEG tube is in place.  Bowel sounds + Extremities: no edema, no cyanosis  The results of significant diagnostics from this hospitalization (including imaging, microbiology, ancillary and laboratory) are listed below for reference.    Microbiology: Recent Results (from the past 240 hour(s))  Resp Panel by RT-PCR (Flu A&B, Covid) Nasopharyngeal Swab     Status: None   Collection Time: 05/01/21 10:50 AM   Specimen: Nasopharyngeal Swab; Nasopharyngeal(NP) swabs in vial transport medium  Result Value Ref Range Status   SARS Coronavirus 2 by RT PCR NEGATIVE NEGATIVE Final    Comment: (NOTE) SARS-CoV-2 target nucleic acids are NOT DETECTED.  The SARS-CoV-2 RNA is generally detectable in upper respiratory specimens during the acute phase of infection. The lowest concentration of SARS-CoV-2 viral copies this  assay can detect is 138 copies/mL. A negative result does not preclude SARS-Cov-2 infection and should not be used as the sole basis for treatment or other patient management decisions. A negative result may occur with  improper specimen collection/handling, submission of specimen other than nasopharyngeal swab, presence of viral mutation(s) within the areas targeted by this assay, and inadequate number of viral copies(<138 copies/mL). A negative result must be combined with clinical observations, patient history, and epidemiological information. The expected result is Negative.  Fact Sheet for Patients:  EntrepreneurPulse.com.au  Fact Sheet for Healthcare Providers:  IncredibleEmployment.be  This test is no t yet approved or cleared by the [place] FDA and  has been authorized for detection and/or diagnosis of SARS-CoV-2 by FDA under an Emergency Use Authorization (EUA). This EUA will remain  in effect (meaning this test can be used) for the duration of the COVID-19 declaration under Section 564(b)(1) of the Act, 21 U.S.C.section 360bbb-3(b)(1), unless the authorization is terminated  or revoked sooner.       Influenza A by PCR NEGATIVE NEGATIVE Final   Influenza B by PCR NEGATIVE NEGATIVE Final    Comment: (NOTE) The Xpert Xpress SARS-CoV-2/FLU/RSV plus assay is intended as an aid in the diagnosis of influenza from Nasopharyngeal swab specimens and should not be used as a sole basis for treatment. Nasal washings and aspirates are unacceptable for Xpert Xpress SARS-CoV-2/FLU/RSV testing.  Fact Sheet for Patients: EntrepreneurPulse.com.au  Fact Sheet for Healthcare Providers: IncredibleEmployment.be  This test is not yet approved or cleared by the [place] FDA and has been authorized for detection and/or diagnosis of SARS-CoV-2 by FDA under an Emergency Use Authorization (EUA). This EUA will  remain in effect (meaning this test can be used) for the duration of the COVID-19 declaration under Section 564(b)(1) of the Act, 21 U.S.C. section 360bbb-3(b)(1), unless the authorization is terminated or revoked.  Performed at [facility], [place] [address], [place], [place] [address]   Blood Culture (routine x 2)     Status: None (Preliminary result)   Collection Time: 05/08/21 11:05 AM   Specimen: BLOOD  Result Value Ref Range Status   Specimen Description BLOOD BLOOD RIGHT HAND  Final   Special Requests   Final    BOTTLES DRAWN AEROBIC AND ANAEROBIC Blood Culture adequate volume   Culture   Final    NO GROWTH 2 DAYS Performed at  [facility], [place] [address], [place], [place] [address]    Report Status PENDING  Incomplete  Resp Panel by RT-PCR (Flu A&B, Covid) Nasopharyngeal Swab     Status: None   Collection Time: 05/08/21 11:06 AM   Specimen: Nasopharyngeal Swab; Nasopharyngeal(NP) swabs in vial transport medium  Result Value Ref Range Status   SARS Coronavirus 2 by RT PCR NEGATIVE NEGATIVE Final    Comment: (NOTE) SARS-CoV-2 target nucleic acids are NOT DETECTED.  The SARS-CoV-2 RNA is generally detectable in upper respiratory specimens during the acute phase of infection. The lowest concentration of SARS-CoV-2 viral copies this assay can detect is 138 copies/mL. A negative result does not preclude SARS-Cov-2 infection and should not be used as the sole basis for treatment or other patient management decisions. A negative result may occur with  improper specimen collection/handling, submission of specimen other than nasopharyngeal swab, presence of viral mutation(s) within the areas targeted by this assay, and inadequate number of viral copies(<138 copies/mL). A negative result must be combined with clinical observations, patient history, and epidemiological information. The expected result is Negative.  Fact Sheet for Patients:   EntrepreneurPulse.com.au  Fact Sheet for Healthcare Providers:  IncredibleEmployment.be  This test is no t yet approved or cleared by the [place] FDA and  has been authorized for detection and/or diagnosis of SARS-CoV-2 by FDA under an Emergency Use Authorization (EUA). This EUA will remain  in effect (meaning this test can be used) for the duration of the COVID-19 declaration under Section 564(b)(1) of the Act, 21 U.S.C.section 360bbb-3(b)(1), unless the authorization is terminated  or revoked sooner.       Influenza A by PCR NEGATIVE NEGATIVE Final   Influenza B by PCR NEGATIVE NEGATIVE Final    Comment: (NOTE) The Xpert Xpress SARS-CoV-2/FLU/RSV plus assay is intended as an aid in the diagnosis of influenza from Nasopharyngeal swab specimens and should not be used as a sole basis for treatment. Nasal washings and aspirates are unacceptable for Xpert Xpress SARS-CoV-2/FLU/RSV testing.  Fact Sheet for Patients: EntrepreneurPulse.com.au  Fact Sheet for Healthcare Providers: IncredibleEmployment.be  This test is not yet approved or cleared by the [place] FDA and has been authorized for detection and/or diagnosis of SARS-CoV-2 by FDA under an Emergency Use Authorization (EUA). This EUA will remain in effect (meaning this test can be used) for the duration of the COVID-19 declaration under Section 564(b)(1) of the Act, 21 U.S.C. section 360bbb-3(b)(1), unless the authorization is terminated or revoked.  Performed at [facility], [place] [address] [place], [place] [address]   Blood Culture (routine x 2)     Status: None (Preliminary result)   Collection Time: 05/08/21 11:10 AM   Specimen: BLOOD  Result Value Ref Range Status   Specimen Description BLOOD BLOOD LEFT HAND  Final   Special Requests   Final    BOTTLES DRAWN AEROBIC AND ANAEROBIC Blood Culture results may not be optimal due  to an inadequate volume of blood received in culture bottles   Culture   Final    NO GROWTH 2 DAYS Performed at [facility], [place] [address], [place], [place] [address]    Report Status PENDING  Incomplete  Urine Culture     Status: Abnormal   Collection Time: 05/08/21  2:39 PM   Specimen: In/Out Cath Urine  Result Value Ref Range Status   Specimen Description IN/OUT CATH URINE  Final   Special Requests   Final  NONE Performed at [facility], [place] [address] [place], [place] [address]    Culture (A)  Final    >=100,000 COLONIES/mL ENTEROCOCCUS FAECIUM VANCOMYCIN RESISTANT ENTEROCOCCUS ISOLATED    Report Status 05/10/2021 FINAL  Final   Organism ID, Bacteria ENTEROCOCCUS FAECIUM (A)  Final      Susceptibility   Enterococcus faecium - MIC*    AMPICILLIN >=32 RESISTANT Resistant     NITROFURANTOIN 128 RESISTANT Resistant     VANCOMYCIN >=32 RESISTANT Resistant     LINEZOLID 2 SENSITIVE Sensitive     * >=100,000 COLONIES/mL ENTEROCOCCUS FAECIUM  MRSA Next Gen by PCR, Nasal     Status: None   Collection Time: 05/08/21  3:38 PM   Specimen: Nasal Mucosa; Nasal Swab  Result Value Ref Range Status   MRSA by PCR Next Gen NOT DETECTED NOT DETECTED Final    Comment: (NOTE) The GeneXpert MRSA Assay (FDA approved for NASAL specimens only), is one component of a comprehensive MRSA colonization surveillance program. It is not intended to diagnose MRSA infection nor to guide or monitor treatment for MRSA infections. Test performance is not FDA approved in patients less than 58 years old. Performed at [facility], [place] [address], [place], [place] [address]   Resp Panel by RT-PCR (Flu A&B, Covid) Nasopharyngeal Swab     Status: None   Collection Time: 05/10/21 12:05 PM   Specimen: Nasopharyngeal Swab; Nasopharyngeal(NP) swabs in vial transport medium  Result Value Ref Range Status   SARS Coronavirus 2 by RT PCR NEGATIVE NEGATIVE Final    Comment: (NOTE) SARS-CoV-2  target nucleic acids are NOT DETECTED.  The SARS-CoV-2 RNA is generally detectable in upper respiratory specimens during the acute phase of infection. The lowest concentration of SARS-CoV-2 viral copies this assay can detect is 138 copies/mL. A negative result does not preclude SARS-Cov-2 infection and should not be used as the sole basis for treatment or other patient management decisions. A negative result may occur with  improper specimen collection/handling, submission of specimen other than nasopharyngeal swab, presence of viral mutation(s) within the areas targeted by this assay, and inadequate number of viral copies(<138 copies/mL). A negative result must be combined with clinical observations, patient history, and epidemiological information. The expected result is Negative.  Fact Sheet for Patients:  EntrepreneurPulse.com.au  Fact Sheet for Healthcare Providers:  IncredibleEmployment.be  This test is no t yet approved or cleared by the [place] FDA and  has been authorized for detection and/or diagnosis of SARS-CoV-2 by FDA under an Emergency Use Authorization (EUA). This EUA will remain  in effect (meaning this test can be used) for the duration of the COVID-19 declaration under Section 564(b)(1) of the Act, 21 U.S.C.section 360bbb-3(b)(1), unless the authorization is terminated  or revoked sooner.       Influenza A by PCR NEGATIVE NEGATIVE Final   Influenza B by PCR NEGATIVE NEGATIVE Final    Comment: (NOTE) The Xpert Xpress SARS-CoV-2/FLU/RSV plus assay is intended as an aid in the diagnosis of influenza from Nasopharyngeal swab specimens and should not be used as a sole basis for treatment. Nasal washings and aspirates are unacceptable for Xpert Xpress SARS-CoV-2/FLU/RSV testing.  Fact Sheet for Patients: EntrepreneurPulse.com.au  Fact Sheet for Healthcare  Providers: IncredibleEmployment.be  This test is not yet approved or cleared by the [place] FDA and has been authorized for detection and/or diagnosis of SARS-CoV-2 by FDA under an Emergency Use Authorization (EUA). This EUA will remain in effect (meaning  this test can be used) for the duration of the COVID-19 declaration under Section 564(b)(1) of the Act, 21 U.S.C. section 360bbb-3(b)(1), unless the authorization is terminated or revoked.  Performed at [facility], [place] [address] [place], [place] [address]     Labs: BNP (last 3 results) Recent Labs    04/30/21 0030 05/01/21 0617 05/08/21 1145  BNP 134.2* 193.0* 102.7*   Basic Metabolic Panel: Recent Labs  Lab 05/07/21 2018 05/08/21 1108 05/08/21 1145 05/08/21 1522 05/09/21 0525 05/09/21 0657 05/09/21 1700 05/10/21 0646  NA 142 139 143 139 137  --   --  133*  K 3.7 5.3* 5.5* 3.9 3.3*  --   --  3.6  CL 111  --  99  --  101  --   --  100  CO2 26  --  38*  --  28  --   --  27  GLUCOSE 112*  --  186*  --  105*  --   --  134*  BUN 17  --  21  --  22  --   --  18  CREATININE 0.36*  --  0.67  --  0.57*  --   --  0.50*  CALCIUM 7.2*  --  10.1  --  8.9  --   --  8.3*  MG  --   --  2.4  --   --  1.9 2.5* 2.1  PHOS  --   --   --   --   --  1.7* 2.2* 2.8   Liver Function Tests: Recent Labs  Lab 05/08/21 1145 05/10/21 0646  AST 24 24  ALT 31 28  ALKPHOS 138* 82  BILITOT 0.3 0.4  PROT 8.3* 5.4*  ALBUMIN 3.7 2.6*   No results for input(s): LIPASE, AMYLASE in the last 168 hours. No results for input(s): AMMONIA in the last 168 hours. CBC: Recent Labs  Lab 05/07/21 2018 05/08/21 1108 05/08/21 1145 05/08/21 1522 05/09/21 0657 05/10/21 0646  WBC 11.5*  --  17.6*  --  10.6* 6.6  NEUTROABS  --   --  16.6*  --   --  4.6  HGB 9.5* 14.3 13.0 10.2* 9.7* 9.3*  HCT 31.6* 42.0 44.0 30.0* 30.6* 28.5*  MCV 93.8  --  96.9  --  89.7 87.2  PLT 322  --  436*  --  269 228   Cardiac  Enzymes: No results for input(s): CKTOTAL, CKMB, CKMBINDEX, TROPONINI in the last 168 hours. BNP: Invalid input(s): POCBNP CBG: Recent Labs  Lab 05/09/21 1956 05/09/21 2351 05/10/21 0351 05/10/21 0750 05/10/21 1134  GLUCAP 120* 136* 130* 101* 111*   D-Dimer No results for input(s): DDIMER in the last 72 hours. Hgb A1c No results for input(s): HGBA1C in the last 72 hours. Lipid Profile No results for input(s): CHOL, HDL, LDLCALC, TRIG, CHOLHDL, LDLDIRECT in the last 72 hours. Thyroid function studies No results for input(s): TSH, T4TOTAL, T3FREE, THYROIDAB in the last 72 hours.  Invalid input(s): FREET3 Anemia work up No results for input(s): VITAMINB12, FOLATE, FERRITIN, TIBC, IRON, RETICCTPCT in the last 72 hours. Urinalysis    Component Value Date/Time   COLORURINE AMBER (A) 05/08/2021 1439   APPEARANCEUR TURBID (A) 05/08/2021 1439   LABSPEC 1.028 05/08/2021 1439   PHURINE 5.0 05/08/2021 1439   GLUCOSEU NEGATIVE 05/08/2021 1439   HGBUR NEGATIVE 05/08/2021 1439   BILIRUBINUR NEGATIVE 05/08/2021 1439   KETONESUR NEGATIVE 05/08/2021 1439   PROTEINUR 100 (A) 05/08/2021 1439  UROBILINOGEN 0.2 09/25/2006 1500   NITRITE NEGATIVE 05/08/2021 1439   LEUKOCYTESUR LARGE (A) 05/08/2021 1439   Sepsis Labs Invalid input(s): PROCALCITONIN,  WBC,  LACTICIDVEN Microbiology Recent Results (from the past 240 hour(s))  Resp Panel by RT-PCR (Flu A&B, Covid) Nasopharyngeal Swab     Status: None   Collection Time: 05/01/21 10:50 AM   Specimen: Nasopharyngeal Swab; Nasopharyngeal(NP) swabs in vial transport medium  Result Value Ref Range Status   SARS Coronavirus 2 by RT PCR NEGATIVE NEGATIVE Final    Comment: (NOTE) SARS-CoV-2 target nucleic acids are NOT DETECTED.  The SARS-CoV-2 RNA is generally detectable in upper respiratory specimens during the acute phase of infection. The lowest concentration of SARS-CoV-2 viral copies this assay can detect is 138 copies/mL. A negative  result does not preclude SARS-Cov-2 infection and should not be used as the sole basis for treatment or other patient management decisions. A negative result may occur with  improper specimen collection/handling, submission of specimen other than nasopharyngeal swab, presence of viral mutation(s) within the areas targeted by this assay, and inadequate number of viral copies(<138 copies/mL). A negative result must be combined with clinical observations, patient history, and epidemiological information. The expected result is Negative.  Fact Sheet for Patients:  EntrepreneurPulse.com.au  Fact Sheet for Healthcare Providers:  IncredibleEmployment.be  This test is no t yet approved or cleared by the [place] FDA and  has been authorized for detection and/or diagnosis of SARS-CoV-2 by FDA under an Emergency Use Authorization (EUA). This EUA will remain  in effect (meaning this test can be used) for the duration of the COVID-19 declaration under Section 564(b)(1) of the Act, 21 U.S.C.section 360bbb-3(b)(1), unless the authorization is terminated  or revoked sooner.       Influenza A by PCR NEGATIVE NEGATIVE Final   Influenza B by PCR NEGATIVE NEGATIVE Final    Comment: (NOTE) The Xpert Xpress SARS-CoV-2/FLU/RSV plus assay is intended as an aid in the diagnosis of influenza from Nasopharyngeal swab specimens and should not be used as a sole basis for treatment. Nasal washings and aspirates are unacceptable for Xpert Xpress SARS-CoV-2/FLU/RSV testing.  Fact Sheet for Patients: EntrepreneurPulse.com.au  Fact Sheet for Healthcare Providers: IncredibleEmployment.be  This test is not yet approved or cleared by the [place] FDA and has been authorized for detection and/or diagnosis of SARS-CoV-2 by FDA under an Emergency Use Authorization (EUA). This EUA will remain in effect (meaning this test can be used)  for the duration of the COVID-19 declaration under Section 564(b)(1) of the Act, 21 U.S.C. section 360bbb-3(b)(1), unless the authorization is terminated or revoked.  Performed at [facility], [place] [address], [place], [place] [address]   Blood Culture (routine x 2)     Status: None (Preliminary result)   Collection Time: 05/08/21 11:05 AM   Specimen: BLOOD  Result Value Ref Range Status   Specimen Description BLOOD BLOOD RIGHT HAND  Final   Special Requests   Final    BOTTLES DRAWN AEROBIC AND ANAEROBIC Blood Culture adequate volume   Culture   Final    NO GROWTH 2 DAYS Performed at [facility], [place] [address], [place], [place] [address]    Report Status PENDING  Incomplete  Resp Panel by RT-PCR (Flu A&B, Covid) Nasopharyngeal Swab     Status: None   Collection Time: 05/08/21 11:06 AM   Specimen: Nasopharyngeal Swab; Nasopharyngeal(NP) swabs in vial transport medium  Result Value Ref Range Status   SARS Coronavirus  2 by RT PCR NEGATIVE NEGATIVE Final    Comment: (NOTE) SARS-CoV-2 target nucleic acids are NOT DETECTED.  The SARS-CoV-2 RNA is generally detectable in upper respiratory specimens during the acute phase of infection. The lowest concentration of SARS-CoV-2 viral copies this assay can detect is 138 copies/mL. A negative result does not preclude SARS-Cov-2 infection and should not be used as the sole basis for treatment or other patient management decisions. A negative result may occur with  improper specimen collection/handling, submission of specimen other than nasopharyngeal swab, presence of viral mutation(s) within the areas targeted by this assay, and inadequate number of viral copies(<138 copies/mL). A negative result must be combined with clinical observations, patient history, and epidemiological information. The expected result is Negative.  Fact Sheet for Patients:  EntrepreneurPulse.com.au  Fact Sheet for Healthcare  Providers:  IncredibleEmployment.be  This test is no t yet approved or cleared by the [place] FDA and  has been authorized for detection and/or diagnosis of SARS-CoV-2 by FDA under an Emergency Use Authorization (EUA). This EUA will remain  in effect (meaning this test can be used) for the duration of the COVID-19 declaration under Section 564(b)(1) of the Act, 21 U.S.C.section 360bbb-3(b)(1), unless the authorization is terminated  or revoked sooner.       Influenza A by PCR NEGATIVE NEGATIVE Final   Influenza B by PCR NEGATIVE NEGATIVE Final    Comment: (NOTE) The Xpert Xpress SARS-CoV-2/FLU/RSV plus assay is intended as an aid in the diagnosis of influenza from Nasopharyngeal swab specimens and should not be used as a sole basis for treatment. Nasal washings and aspirates are unacceptable for Xpert Xpress SARS-CoV-2/FLU/RSV testing.  Fact Sheet for Patients: EntrepreneurPulse.com.au  Fact Sheet for Healthcare Providers: IncredibleEmployment.be  This test is not yet approved or cleared by the [place] FDA and has been authorized for detection and/or diagnosis of SARS-CoV-2 by FDA under an Emergency Use Authorization (EUA). This EUA will remain in effect (meaning this test can be used) for the duration of the COVID-19 declaration under Section 564(b)(1) of the Act, 21 U.S.C. section 360bbb-3(b)(1), unless the authorization is terminated or revoked.  Performed at [facility], [place] [address], [place], [place] [address]   Blood Culture (routine x 2)     Status: None (Preliminary result)   Collection Time: 05/08/21 11:10 AM   Specimen: BLOOD  Result Value Ref Range Status   Specimen Description BLOOD BLOOD LEFT HAND  Final   Special Requests   Final    BOTTLES DRAWN AEROBIC AND ANAEROBIC Blood Culture results may not be optimal due to an inadequate volume of blood received in culture bottles   Culture    Final    NO GROWTH 2 DAYS Performed at [facility], [place] [address] [place], [place] [address]    Report Status PENDING  Incomplete  Urine Culture     Status: Abnormal   Collection Time: 05/08/21  2:39 PM   Specimen: In/Out Cath Urine  Result Value Ref Range Status   Specimen Description IN/OUT CATH URINE  Final   Special Requests   Final    NONE Performed at [facility], [place] [address], [place],  [address]    Culture (A)  Final    >=100,000 COLONIES/mL ENTEROCOCCUS FAECIUM VANCOMYCIN RESISTANT ENTEROCOCCUS ISOLATED    Report Status 05/10/2021 FINAL  Final   Organism ID, Bacteria ENTEROCOCCUS FAECIUM (A)  Final      Susceptibility   Enterococcus faecium -  MIC*    AMPICILLIN >=32 RESISTANT Resistant     NITROFURANTOIN 128 RESISTANT Resistant     VANCOMYCIN >=32 RESISTANT Resistant     LINEZOLID 2 SENSITIVE Sensitive     * >=100,000 COLONIES/mL ENTEROCOCCUS FAECIUM  MRSA Next Gen by PCR, Nasal     Status: None   Collection Time: 05/08/21  3:38 PM   Specimen: Nasal Mucosa; Nasal Swab  Result Value Ref Range Status   MRSA by PCR Next Gen NOT DETECTED NOT DETECTED Final    Comment: (NOTE) The GeneXpert MRSA Assay (FDA approved for NASAL specimens only), is one component of a comprehensive MRSA colonization surveillance program. It is not intended to diagnose MRSA infection nor to guide or monitor treatment for MRSA infections. Test performance is not FDA approved in patients less than 36 years old. Performed at [facility], [place] [address], [place], [place] [address]   Resp Panel by RT-PCR (Flu A&B, Covid) Nasopharyngeal Swab     Status: None   Collection Time: 05/10/21 12:05 PM   Specimen: Nasopharyngeal Swab; Nasopharyngeal(NP) swabs in vial transport medium  Result Value Ref Range Status   SARS Coronavirus 2 by RT PCR NEGATIVE NEGATIVE Final    Comment: (NOTE) SARS-CoV-2 target nucleic acids are NOT DETECTED.  The SARS-CoV-2 RNA is generally  detectable in upper respiratory specimens during the acute phase of infection. The lowest concentration of SARS-CoV-2 viral copies this assay can detect is 138 copies/mL. A negative result does not preclude SARS-Cov-2 infection and should not be used as the sole basis for treatment or other patient management decisions. A negative result may occur with  improper specimen collection/handling, submission of specimen other than nasopharyngeal swab, presence of viral mutation(s) within the areas targeted by this assay, and inadequate number of viral copies(<138 copies/mL). A negative result must be combined with clinical observations, patient history, and epidemiological information. The expected result is Negative.  Fact Sheet for Patients:  EntrepreneurPulse.com.au  Fact Sheet for Healthcare Providers:  IncredibleEmployment.be  This test is no t yet approved or cleared by the [place] FDA and  has been authorized for detection and/or diagnosis of SARS-CoV-2 by FDA under an Emergency Use Authorization (EUA). This EUA will remain  in effect (meaning this test can be used) for the duration of the COVID-19 declaration under Section 564(b)(1) of the Act, 21 U.S.C.section 360bbb-3(b)(1), unless the authorization is terminated  or revoked sooner.       Influenza A by PCR NEGATIVE NEGATIVE Final   Influenza B by PCR NEGATIVE NEGATIVE Final    Comment: (NOTE) The Xpert Xpress SARS-CoV-2/FLU/RSV plus assay is intended as an aid in the diagnosis of influenza from Nasopharyngeal swab specimens and should not be used as a sole basis for treatment. Nasal washings and aspirates are unacceptable for Xpert Xpress SARS-CoV-2/FLU/RSV testing.  Fact Sheet for Patients: EntrepreneurPulse.com.au  Fact Sheet for Healthcare Providers: IncredibleEmployment.be  This test is not yet approved or cleared by the [place] FDA  and has been authorized for detection and/or diagnosis of SARS-CoV-2 by FDA under an Emergency Use Authorization (EUA). This EUA will remain in effect (meaning this test can be used) for the duration of the COVID-19 declaration under Section 564(b)(1) of the Act, 21 U.S.C. section 360bbb-3(b)(1), unless the authorization is terminated or revoked.  Performed at [facility], [place] [address] [place], [place] [address]    Time coordinating discharge: 35 minutes  SIGNED:  Raiford Noble, DO [facility] 05/10/2021, 1:46 PM  Pager is on AMION  If 7PM-7AM, please contact night-coverage [email or web address]

## 2021-05-10 NOTE — Consult Note (Addendum)
? ?                                                                                ?Consultation Note ?Date: 05/10/2021  ? ?Patient Name: Austin Davis  ?DOB: November 17, 1940  MRN: [id number]  Age / Sex: 81 y.o., male  ?PCP: Aaron Edelman, MD ?Referring Physician: Kerney Elbe, DO ? ?Reason for Consultation: Establishing goals of care ? ?HPI/Patient Profile: 81 y.o. 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  ? ?CT on admission revealed left lower lobe spiculated mass.  As per CCM, if malignant, little can be offered given advanced COPD and ventilator dependence. ? ?PMT was consulted to discuss goals of care. ? ?Clinical Assessment and Goals of Care: ?I have reviewed medical records including EPIC notes, labs and imaging, assessed the patient and then met with patient at bedside to discuss diagnosis prognosis, GOC, EOL wishes, disposition and options. ? ?I introduced Palliative Medicine as specialized medical care for people living with serious illness. It focuses on providing relief from the symptoms and stress of a serious illness. The goal is to improve quality of life for both the patient and the family. ? ?Given patient's trach dependence and inability to use the Passy-Muir valve, I utilized a Data processing manager with the alphabet and common phrases.  Patient was able to point to letters and spell out his thoughts and questions. ? ?Patient confirmed that he has no acute complaints or issues at this time.  He spelled out that he would like to know when he is going to be moved.  I shared that he will likely be discharged back to [facility] today.  I discussed that ongoing palliative support can be helpful to him given his chronic illnesses and comorbidities.  Patient nodded  his head in agreement. ? ?I attempted to elicit values and goals of care important to the patient.  Patient pointed to the phrase "I want more control".  I shared that patient has control over his medical decisions such as whether or not to continue with a feeding tube, whether or not to return to the hospital, whether or not to accept or decline future medical interventions.  Patient spelled out "I do not want to die".  Education offered regarding concept specific to human mortality and the limitations of medical interventions to prolong life when the body begins to fail to thrive.  ? ?Discussed with patient/family the importance of continued conversation with family and the medical providers regarding overall plan of care and treatment options, ensuring decisions are within the context of the patient?s values and GOCs.   ? ?Palliative Care services outpatient were explained and offered.  Patient nodded fervently and pointed to yes several times as I outlined palliative outpatient services.  Referral placed. ? ?I spoke with patient's daughter Austin Davis over the phone and relayed informationa nd discussion above. Austin Davis in agreement with outpatient palliative and oncology f/u at [facility] - aware that PMT has team there as well.  ? ?  Questions and concerns were addressed. The family was encouraged to call with questions or concerns.  ? ?Primary Decision Maker ?PATIENT ? ?Code Status/Advance Care Planning: ?DNR ? ?Prognosis:   ?Unable to determine ? ?Discharge Planning: [facility] with palliative services ? ?Primary Diagnoses: ?Present on Admission: ? Acute on chronic respiratory failure with hypoxia and hypercapnia (HCC) ? Acute metabolic encephalopathy ? AF (paroxysmal atrial fibrillation) ([place]) ? Aspiration pneumonia ([place]) ? Chronic heart failure with preserved ejection fraction ([place]) ? Chronic obstructive pulmonary disease (HCC) ? Failure to thrive in adult ? Essential tremor ? Hypotension ? Obstructive sleep  apnea ? Protein-calorie malnutrition, severe ? Pneumonia ? Encephalopathy ? ? ?Physical Exam ?Vitals and nursing note reviewed.  ?HENT:  ?   Head: Normocephalic.  ?   Mouth/Throat:  ?   Comments: Trach with ties in place ?Eyes:  ?   Pupils: Pupils are equal, round, and reactive to light.  ?Cardiovascular:  ?   Rate and Rhythm: Normal rate.  ?   Pulses: Normal pulses.  ?Pulmonary:  ?   Comments: Vent dependent ?Abdominal:  ?   Palpations: Abdomen is soft.  ?Musculoskeletal:  ?   Comments: bedbound  ?Skin: ?   General: Skin is warm and dry.  ?Neurological:  ?   Mental Status: He is alert.  ?   Comments: Nonverbal but able to communicate with letter board  ?Psychiatric:     ?   Mood and Affect: Mood normal.     ?   Behavior: Behavior normal.     ?   Thought Content: Thought content normal.     ?   Judgment: Judgment normal.  ? ? ?Palliative Assessment/Data: 30% ? ? ? ? ?I discussed this patient's plan of care with patient. ? ?Thank you for this consult. Palliative medicine will continue to follow and assist holistically.  ? ?Time Total: 110 minutes ?Greater than 50%  of this time was spent counseling and coordinating care related to the above assessment and plan. ? ?Signed by: ?Jordan Hawks, DNP, FNP-BC ?Palliative Medicine ? ?  ?Please contact Palliative Medicine Team phone at [phone] for questions and concerns.  ?For individual provider: See Amion ? ? ? ? ? ? ? ? ? ? ? ? ?  ?

## 2021-05-10 NOTE — Progress Notes (Addendum)
Pharmacy Antibiotic Note ? ?Austin Davis is a 81 y.o. male for which pharmacy has been consulted for cefepime dosing for UTI and PNA.  Also on Zyvox given history of VRE. ? ?Patient with a history of chronic resp failure s/p trach and vent dependent, HF, COPD, AF, DVT, GOB, tremor. Patient presenting with questionable seizure. ? ?Renal fxn stable, afebrile, WBC WNL ? ?Plan: ?Continue cefepime 2gm IV Q12H ?Pharmacy will sign off and follow peripherally.  Thank you for the consult! ? ?Height: 5\' 10"  (177.8 cm) ?Weight: 55.6 kg (122 lb 9.2 oz) ?IBW/kg (Calculated) : 73 ? ?Temp (24hrs), Avg:98.4 ?F (36.9 ?C), Min:97.9 ?F (36.6 ?C), Max:99 ?F (37.2 ?C) ? ?Recent Labs  ?Lab 05/07/21 ?2018 05/08/21 ?1145 05/08/21 ?1500 05/09/21 ?9244 05/09/21 ?6286 05/10/21 ?3817  ?WBC 11.5* 17.6*  --   --  10.6* 6.6  ?CREATININE 0.36* 0.67  --  0.57*  --  0.50*  ?LATICACIDVEN  --  2.0* 3.9*  --   --   --   ? ?  ?Estimated Creatinine Clearance: 57.9 mL/min (A) (by C-G formula based on SCr of 0.5 mg/dL (L)).   ? ?Allergies  ?Allergen Reactions  ? Prednisone Other (See Comments)  ?  Makes skin crawl, rapid HR  ? Cortisone Palpitations  ? ?Vanc x 1 in ED ?Linezolid 4/19 >>  ?Cefepime 4/19 >> ? ?4/10 BCx: NGTD ?4/19 BCx: IP ?4/19 UCx: IP ? ?Vedika Dumlao D. Mina Marble, PharmD, BCPS, BCCCP ?05/10/2021, 8:36 AM ? ?

## 2021-05-10 NOTE — Progress Notes (Signed)
eLink Physician-Brief Progress Note ?Patient Name: Austin Davis ?DOB: December 12, 1940 ?MRN: [id number] ? ? ?Date of Service ? 05/10/2021  ?HPI/Events of Note ? Patient has put out 1500 ml of urine this shift and now his BP is 81/53, MAP 63, urine output has tapered off, he does not have a central line to facilitate checking a CVP.  ?eICU Interventions ? Will give him back 250 ml of crystalloid via a fluid bolus x 1.  ? ? ? ?  ? ?Austin Davis ?05/10/2021, 6:37 AM ?

## 2021-05-10 NOTE — Progress Notes (Signed)
Pt discharged to [facility] via [facility]. Report called to Leigh Aurora, RN/LPN. All belongings sent with patient. Education completed and all questions answered.  ?

## 2021-05-10 NOTE — TOC Progression Note (Signed)
Transition of Care (TOC) - Progression Note  ? ? ?Patient Details  ?Name: Austin Davis ?MRN: [id number] ?Date of Birth: 12/18/1940 ? ?Transition of Care (TOC) CM/SW Contact  ?Amador Cunas, [employer] ?Phone Number: ?05/10/2021, 11:11 AM ? ?Clinical Narrative:   Spoke to [place] with [facility] who confirms pt is a LTC resident in their sub-acute unit and is able to return. Pt continues to require vent support by trach, Levada Dy aware. Per MD, plan for dc back to [facility] today. Will arrange [facility] for transport once DC summary complete and COVID test resulted.  ? ?Wandra Feinstein, MSW, LCSW ?[phone] (coverage) ? ? ? ? ? ?  ?  ? ?Expected Discharge Plan and Services ?  ?  ?  ?  ?  ?                ?  ?  ?  ?  ?  ?  ?  ?  ?  ?  ? ? ?Social Determinants of Health (SDOH) Interventions ?  ? ?Readmission Risk Interventions ?   ? View : No data to display.  ?  ?  ?  ? ? ?

## 2021-05-10 NOTE — TOC Transition Note (Signed)
Transition of Care (TOC) - CM/SW Discharge Note ? ? ?Patient Details  ?Name: Austin Davis ?MRN: [id number] ?Date of Birth: 1940-12-19 ? ?Transition of Care (TOC) CM/SW Contact:  ?Amador Cunas, LCSW ?Phone Number: ?05/10/2021, 3:01 PM ? ? ?Clinical Narrative: Pt for dc back to [facility] (sub-acute unit) today where he is a LTC resident requiring vent support via trach. Spoke to [place] with [facility] who confirmed pt is able to return to room 303-B with accepting physician Dr. Abelardo Diesel. Pt's dtr aware of dc and reports agreeable. Packet complete and RN provided with number for report. [facility] arranged for transport. SW signing off at dc.  ? ?Wandra Feinstein, MSW, LCSW ?[phone] (coverage) ? ?   ? ? ? ?Final next level of care: [place] ?Barriers to Discharge: No Barriers Identified ? ? ?Patient Goals and CMS Choice ?  ?  ?  ? ?Discharge Placement ?  ?           ?  ?Patient to be transferred to facility by: [facility] ?Name of family member notified: Kelly/dtr ?Patient and family notified of of transfer: 05/10/21 ? ?Discharge Plan and Services ?  ?  ?           ?  ?  ?  ?  ?  ?  ?  ?  ?  ?  ? ?Social Determinants of Health (SDOH) Interventions ?  ? ? ?Readmission Risk Interventions ?   ? View : No data to display.  ?  ?  ?  ? ? ? ? ? ?

## 2021-05-11 DIAGNOSIS — I480 Paroxysmal atrial fibrillation: Secondary | ICD-10-CM | POA: Diagnosis not present

## 2021-05-11 DIAGNOSIS — F419 Anxiety disorder, unspecified: Secondary | ICD-10-CM | POA: Diagnosis not present

## 2021-05-11 DIAGNOSIS — J9620 Acute and chronic respiratory failure, unspecified whether with hypoxia or hypercapnia: Secondary | ICD-10-CM | POA: Diagnosis not present

## 2021-05-11 DIAGNOSIS — L89153 Pressure ulcer of sacral region, stage 3: Secondary | ICD-10-CM | POA: Diagnosis not present

## 2021-05-11 DIAGNOSIS — G9341 Metabolic encephalopathy: Secondary | ICD-10-CM | POA: Diagnosis not present

## 2021-05-11 DIAGNOSIS — Z430 Encounter for attention to tracheostomy: Secondary | ICD-10-CM | POA: Diagnosis not present

## 2021-05-11 DIAGNOSIS — G250 Essential tremor: Secondary | ICD-10-CM | POA: Diagnosis not present

## 2021-05-11 DIAGNOSIS — Z431 Encounter for attention to gastrostomy: Secondary | ICD-10-CM | POA: Diagnosis not present

## 2021-05-11 DIAGNOSIS — J9503 Malfunction of tracheostomy stoma: Secondary | ICD-10-CM | POA: Diagnosis not present

## 2021-05-11 DIAGNOSIS — G894 Chronic pain syndrome: Secondary | ICD-10-CM | POA: Diagnosis not present

## 2021-05-11 DIAGNOSIS — I5023 Acute on chronic systolic (congestive) heart failure: Secondary | ICD-10-CM | POA: Diagnosis not present

## 2021-05-11 DIAGNOSIS — Z9911 Dependence on respirator [ventilator] status: Secondary | ICD-10-CM | POA: Diagnosis not present

## 2021-05-11 DIAGNOSIS — G4733 Obstructive sleep apnea (adult) (pediatric): Secondary | ICD-10-CM | POA: Diagnosis not present

## 2021-05-11 DIAGNOSIS — J449 Chronic obstructive pulmonary disease, unspecified: Secondary | ICD-10-CM | POA: Diagnosis not present

## 2021-05-11 DIAGNOSIS — R1310 Dysphagia, unspecified: Secondary | ICD-10-CM | POA: Diagnosis not present

## 2021-05-11 DIAGNOSIS — N390 Urinary tract infection, site not specified: Secondary | ICD-10-CM | POA: Diagnosis not present

## 2021-05-13 LAB — CULTURE, BLOOD (ROUTINE X 2)
Culture: NO GROWTH
Culture: NO GROWTH
Special Requests: ADEQUATE

## 2021-05-15 ENCOUNTER — Inpatient Hospital Stay: Payer: Medicare Other | Attending: Hematology and Oncology | Admitting: Hematology and Oncology

## 2021-05-15 DIAGNOSIS — R911 Solitary pulmonary nodule: Secondary | ICD-10-CM | POA: Diagnosis not present

## 2021-05-15 NOTE — Assessment & Plan Note (Signed)
Chronic respiratory failure ventilator dependent, CHF, COPD, A-fib, DVT, GI bleed, seizure (lives in [place]) ?CT chest 05/08/2021: Spiculated left lower lobe nodule measuring 8 x 12 mm new compared to August CT scan.  Peribronchovascular groundglass nodularity right lower lobe (chronic aspiration related) ? ?Counseling: I discussed with the patient's daughter that the differential diagnosis for lung nodule could be between infection, inflammation, malignancy ?Unfortunately patient is not in good health to undergo surgery or chemo or radiation. ? ?Plan: Recommend palliative care ?Without any pathology, we cannot determine the absolute prognosis including the rate of growth or his survival. ?Pulmonary suggested a 97-month follow-up CT scan which is reasonable. ?No additional recommendations from [place].  We are happy to see the patient back on an as-needed basis virtually ?

## 2021-05-15 NOTE — Progress Notes (Signed)
HEMATOLOGY-ONCOLOGY TELEPHONE VISIT PROGRESS NOTE ? ?I discussed the limitations, risks, security and privacy concerns of performing an evaluation and management service by telephone and the availability of in person appointments.  ?I also discussed with the patient that there may be a patient responsible charge related to this service. The patient expressed understanding and agreed to proceed.  ? ?History of Present Illness:  ?Discussed with patients daughter about the findings from the recent hospitalization when he was admitted with seizure-like activity and was found to have pneumonia.  He was treated for the pneumonia and was sent back to [facility].  He has remained stable there.  Pulmonary and oncology were asked to consult the findings on the CT scan which showed 8 x 12 mm nodule in the left lower lung with spiculated margins concerning for lung cancer.  There was also lots of evidence of right lower lobe infiltrates. ? ?All other systems were reviewed with the patient and are negative. ?Observations/Objective:  ? ?  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  Peribronchovascular groundglass nodularity right lower lobe (chronic aspiration related) ? ?Counseling: I discussed with the patient's daughter that the differential diagnosis for lung nodule could be between infection, inflammation, malignancy ?Unfortunately patient is not in good health to undergo surgery or chemo or radiation. ? ?Plan:Pulmonary suggested a 1-month follow-up CT scan which is reasonable.  I will order a CT scan of the chest in 6 months. ?If in 6 months the nodule does not grow then we can assume that it is scar tissue/infection related.  If it has increased in size then we would need to obtain a PET scan  and a biopsy. ?At this time patient is not in any shape or form to undergo any procedures or treatments. ?Rationale to doing another CT scan is in case he improves and he is able to handle biopsy or treatments. ? ?If he clinically deteriorates then I would not recommend doing any further scans or work-ups. ?Patient's daughter understands this completely and is agreeable. ? ? ?I discussed the assessment and treatment plan with the patient. The patient was provided an opportunity to ask questions and all were answered. The patient agreed with the plan and demonstrated an understanding of the instructions. The patient was advised to call back or seek an in-person evaluation if the symptoms worsen or if the condition fails to improve as anticipated.  ? ?I provided 25 minutes of non-face-to-face time during this encounter. Harriette Ohara, MD  ? ?

## 2021-05-16 DIAGNOSIS — J9620 Acute and chronic respiratory failure, unspecified whether with hypoxia or hypercapnia: Secondary | ICD-10-CM | POA: Diagnosis not present

## 2021-05-20 DIAGNOSIS — J9620 Acute and chronic respiratory failure, unspecified whether with hypoxia or hypercapnia: Secondary | ICD-10-CM | POA: Diagnosis not present

## 2021-05-29 ENCOUNTER — Emergency Department (HOSPITAL_COMMUNITY): Payer: Medicare Other

## 2021-05-29 ENCOUNTER — Encounter (HOSPITAL_COMMUNITY): Payer: Self-pay | Admitting: *Deleted

## 2021-05-29 ENCOUNTER — Observation Stay (HOSPITAL_COMMUNITY)
Admission: EM | Admit: 2021-05-29 | Discharge: 2021-05-31 | Disposition: A | Discharge status: Skilled Nursing Facility | Payer: Medicare Other | Attending: Family Medicine | Admitting: Family Medicine

## 2021-05-29 ENCOUNTER — Other Ambulatory Visit: Payer: Self-pay

## 2021-05-29 DIAGNOSIS — I5022 Chronic systolic (congestive) heart failure: Secondary | ICD-10-CM | POA: Insufficient documentation

## 2021-05-29 DIAGNOSIS — N390 Urinary tract infection, site not specified: Secondary | ICD-10-CM | POA: Insufficient documentation

## 2021-05-29 DIAGNOSIS — I2510 Atherosclerotic heart disease of native coronary artery without angina pectoris: Secondary | ICD-10-CM | POA: Diagnosis not present

## 2021-05-29 DIAGNOSIS — Z9911 Dependence on respirator [ventilator] status: Secondary | ICD-10-CM

## 2021-05-29 DIAGNOSIS — R911 Solitary pulmonary nodule: Secondary | ICD-10-CM | POA: Insufficient documentation

## 2021-05-29 DIAGNOSIS — J449 Chronic obstructive pulmonary disease, unspecified: Secondary | ICD-10-CM | POA: Diagnosis not present

## 2021-05-29 DIAGNOSIS — G40901 Epilepsy, unspecified, not intractable, with status epilepticus: Secondary | ICD-10-CM

## 2021-05-29 DIAGNOSIS — G20 Parkinson's disease: Secondary | ICD-10-CM | POA: Insufficient documentation

## 2021-05-29 DIAGNOSIS — R6889 Other general symptoms and signs: Secondary | ICD-10-CM | POA: Diagnosis present

## 2021-05-29 DIAGNOSIS — Z86718 Personal history of other venous thrombosis and embolism: Secondary | ICD-10-CM | POA: Diagnosis not present

## 2021-05-29 DIAGNOSIS — R569 Unspecified convulsions: Secondary | ICD-10-CM | POA: Diagnosis not present

## 2021-05-29 DIAGNOSIS — Z79899 Other long term (current) drug therapy: Secondary | ICD-10-CM | POA: Insufficient documentation

## 2021-05-29 DIAGNOSIS — E43 Unspecified severe protein-calorie malnutrition: Secondary | ICD-10-CM | POA: Insufficient documentation

## 2021-05-29 DIAGNOSIS — E785 Hyperlipidemia, unspecified: Secondary | ICD-10-CM | POA: Diagnosis present

## 2021-05-29 DIAGNOSIS — J189 Pneumonia, unspecified organism: Secondary | ICD-10-CM | POA: Insufficient documentation

## 2021-05-29 DIAGNOSIS — I4891 Unspecified atrial fibrillation: Secondary | ICD-10-CM | POA: Diagnosis not present

## 2021-05-29 DIAGNOSIS — Z20822 Contact with and (suspected) exposure to covid-19: Secondary | ICD-10-CM | POA: Diagnosis not present

## 2021-05-29 DIAGNOSIS — I480 Paroxysmal atrial fibrillation: Secondary | ICD-10-CM | POA: Insufficient documentation

## 2021-05-29 DIAGNOSIS — G40909 Epilepsy, unspecified, not intractable, without status epilepticus: Principal | ICD-10-CM | POA: Insufficient documentation

## 2021-05-29 MED ORDER — SODIUM CHLORIDE 0.9 % IV BOLUS
500.0000 mL | Freq: Once | INTRAVENOUS | Status: AC
  Administered 2021-05-30: 500 mL via INTRAVENOUS

## 2021-05-29 NOTE — ED Triage Notes (Signed)
Pt arrived with [facility] from [facility] for seizure. Staff reported at least 57mins of seizures by the time [facility] arrived. Given 2.5mg  versed IV pta. Baseline: pt A&Ox4, facial tremors, vent/trach dependent. On arrival,  Oral trauma noted. Pt following commands. PICC noted to R upper arm occluded per [facility]. 20g IV to L AC ?

## 2021-05-29 NOTE — ED Provider Notes (Signed)
?[facility] ?Provider Note ? ?CSN: [id number] ?Arrival date & time: 05/29/21 2334 ? ?Chief Complaint(s) ?Seizures ? ?HPI ?ROSCOE WITTS is a 81 y.o. male with extensive past medical history listed below including paroxysmal A-fib not on blood thinners, CHF with a last EF of 45 to 50% in August 2022, recurrent aspiration pneumonia, trach dependent, essential tremors, new onset seizures within the last 45 days currently on Keppra.  Patient is here for seizures while at the skilled nursing facility.  Described by EMS as head bobbing.  Patient was unresponsive.  EMS was told patient was seizing and unresponsive for approximately 20 to 40 minutes.  Actively seizing with EMS for at least 2 minutes.  Responded to 2-1/2 mg of IV Versed.  ? ?Currently patient is coming to.  Responding to verbal stimuli and following commands.  Has no physical complaints.  Noted to have oral trauma.  ? ?On record review patient was last seen on 418 for seizure-like activity and his Keppra was increased from 500 twice daily to 750 twice daily.  On review of MAR patient is receiving his medication. ? ? ?Seizures ? ?Past Medical History ?Past Medical History:  ?Diagnosis Date  ? CHF (congestive heart failure) ([place])   ? Coronary artery disease   ? Essential tremor   ? GI bleed 2019  ? hospitalized at [facility] for one week  ? Headache   ? since childhood  ? Low blood sugar   ? since childhood, controlled by diet  ? Mitral regurgitation   ? Mitral valve prolapse   ? Osteopenia 2021  ? Paroxysmal atrial fibrillation ([facility])   ? ?Patient Active Problem List  ? Diagnosis Date Noted  ? 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[place]) 05/08/2021  ? Pneumonia 05/08/2021  ? Hyperkalemia 05/08/2021  ? UTI (urinary tract infection) 05/08/2021  ? Encephalopathy 05/08/2021  ? Pressure injury of skin 04/30/2021  ? Seizure ([place]) 04/29/2021  ? Pneumothorax 04/11/2021  ? Ventilator  dependent ([place])   ? Aspiration pneumonia ([place]) 03/25/2021  ? 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 Obstructive sleep apnea 10/24/2020  ? Chronic obstructive pulmonary disease ([place]) 10/24/2020  ? Chronic heart failure with preserved ejection fraction ([place]) 10/24/2020  ? Unintentional weight loss 09/20/2020  ? Acute metabolic encephalopathy 76/16/0737  ? SVT (supraventricular tachycardia) ([place]) 09/20/2020  ? Protein-calorie malnutrition, severe 09/17/2020  ? Acute on chronic respiratory failure with hypoxia and hypercapnia ([place]) 09/16/2020  ? Failure to thrive in adult 09/16/2020  ? AF (paroxysmal atrial fibrillation) ([place]) 09/16/2020  ? Stage 1 skin ulcer of sacral region ([place]) 09/16/2020  ? Hypotension 06/30/2020  ? History of GI bleed 02/17/2017  ? Rectal bleeding 02/16/2017  ? 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 Essential tremor 02/19/2015  ? Status post mitral valve annuloplasty and MAZE 2005 12/12/2014  ? HLD (hyperlipidemia) 05/05/2013  ? DOE (dyspnea on exertion) 04/22/2013  ? Fatigue 04/22/2013  ? Atrial fibrillation ([place]) 05/08/2012  ? ?Home Medication(s) ?Prior to Admission medications   ?Medication Sig Start Date End Date Taking? Authorizing Provider  ?acetaminophen (TYLENOL) 325 MG tablet Place 650 mg into feeding tube every 8 (eight) hours as needed for moderate pain.   Yes [provider]  ?ALPRAZolam (XANAX) 0.5 MG tablet Place 1 tablet (0.5 mg total) into feeding tube every 8 (eight) hours as needed for anxiety. 05/10/21  Yes Raiford Noble [place], DO  ?Cholecalciferol (VITAMIN D3 SUPER STRENGTH) 50 MCG (  2000 UT) TABS Give 2,000 Units by tube daily.   Yes [provider]  ?escitalopram (LEXAPRO) 10 MG tablet Place 10 mg into feeding tube daily.   Yes [provider]  ?esomeprazole (NEXIUM) 40 MG packet 40 mg daily. Per G tube   Yes [provider]  ?fexofenadine (ALLEGRA) 180 MG tablet Place 180 mg into feeding tube daily.   Yes [provider]   ?fluticasone (FLONASE) 50 MCG/ACT nasal spray Place 2 sprays into both nostrils in the morning and at bedtime.   Yes [provider]  ?folic acid (FOLVITE) 1 MG tablet Take 1 tablet (1 mg total) by mouth daily. ?Patient taking differently: Place 1 mg into feeding tube daily. 09/29/20  Yes [name], MD  ?furosemide (LASIX) 20 MG tablet Place 1 tablet (20 mg total) into feeding tube daily as needed for fluid or edema. 04/16/21  Yes [name], DO  ?guaiFENesin (ROBITUSSIN) 100 MG/5ML liquid Place 400 mg into feeding tube 3 (three) times daily.   Yes [provider]  ?hydrOXYzine (ATARAX) 25 MG tablet Place 25 mg into feeding tube every 6 (six) hours as needed for anxiety.   Yes [provider]  ?lactulose (CHRONULAC) 10 GM/15ML solution Place 20 g into feeding tube 2 (two) times daily as needed for mild constipation.   Yes [provider]  ?levalbuterol (XOPENEX HFA) 45 MCG/ACT inhaler Inhale 1 puff into the lungs in the morning and at bedtime.   Yes [provider]  ?levalbuterol (XOPENEX) 1.25 MG/3ML nebulizer solution Take 1.25 mg by nebulization in the morning and at bedtime.   Yes [provider]  ?levETIRAcetam (KEPPRA) 100 MG/ML solution Place 7.5 mLs (750 mg total) into feeding tube 2 (two) times daily. 05/07/21 07/06/21 Yes Trifan, Carola Rhine, MD  ?levothyroxine (SYNTHROID) 25 MCG tablet Place 25 mcg into feeding tube daily before breakfast.   Yes [provider]  ?midodrine (PROAMATINE) 10 MG tablet Place 1 tablet (10 mg total) into feeding tube 3 (three) times daily with meals. 05/01/21  Yes Thurnell Lose, MD  ?Multiple Vitamin (MULTIVITAMIN WITH MINERALS) TABS tablet Place 1 tablet into feeding tube daily. 05/11/21  Yes Raiford Noble [place], DO  ?Nutritional Supplements (FEEDING SUPPLEMENT, JEVITY 1.5 CAL/FIBER,) LIQD Place 1,000 mLs into feeding tube continuous. 05/10/21  Yes Raiford Noble [place], DO  ?Omega-3 Fatty Acids (SUPER  OMEGA 3 EPA/DHA) 1000 MG CAPS Give 1,000 mg by tube daily.   Yes [provider]  ?ondansetron (ZOFRAN) 4 MG tablet Take 1 tablet (4 mg total) by mouth every 6 (six) hours as needed for nausea. 05/10/21  Yes Sheikh, Omair Latif, DO  ?polyethylene glycol powder (GLYCOLAX/MIRALAX) 17 GM/SCOOP powder Place 17 g into feeding tube every other day.   Yes [provider]  ?Probiotic Product (CULTURELLE PROBIOTICS PO) Give 1 capsule by tube daily. 10 billion cell   Yes [provider]  ?propranolol (INDERAL) 10 MG tablet Place 1 tablet (10 mg total) into feeding tube daily as needed (for tremors in AM. do not administer if heart rate is below 60 or systolic blood pressure is below 90.). ?Patient taking differently: Place 10 mg into feeding tube daily. Do not administer if heart rate is less than 60 or systolic BP is less than 90 05/01/21  Yes Thurnell Lose, MD  ?sodium chloride (OCEAN) 0.65 % SOLN nasal spray Place 1 spray into both nostrils every 4 (four) hours as needed for congestion.   Yes [provider]  ?tamsulosin (FLOMAX) 0.4 MG CAPS capsule Take 0.4 mg by mouth at bedtime.   Yes [provider]  ?vitamin C (ASCORBIC ACID) 500 MG tablet Place 500 mg into feeding tube daily.   Yes [provider]  ?zinc gluconate 50 MG tablet Place 50 mg into feeding tube 2 (two) times daily.   Yes [provider]  ?ceFEPime (MAXIPIME) IVPB Inject 2 g into the vein every 8 (eight) hours. Indication:  HAP ?First Dose: No ?Last Day of Therapy:  05/14/21 ?Labs - Once weekly:  CBC/D and BMP, ?Labs - Every other week:  ESR and CRP ?Method of administration: IV Push ?Method of administration may be changed at the discretion of home infusion pharmacist based upon assessment of the patient and/or caregiver's ability to self-administer the medication ordered. ?Patient not taking: Reported on 05/30/2021 05/10/21   Raiford Noble Latif, DO  ?clonazePAM (KLONOPIN) 0.5 MG tablet Place  1 tablet (0.5 mg total) into feeding tube 2 (two) times daily. ?Patient not taking: Reported on 05/30/2021 05/10/21   Raiford Noble Latif, DO  ?ipratropium-albuterol (DUONEB) 0.5-2.5 (3) MG/3ML SOLN Take 3 mLs by

## 2021-05-30 ENCOUNTER — Other Ambulatory Visit: Payer: Self-pay

## 2021-05-30 ENCOUNTER — Observation Stay (HOSPITAL_COMMUNITY): Payer: Medicare Other

## 2021-05-30 DIAGNOSIS — R6889 Other general symptoms and signs: Secondary | ICD-10-CM | POA: Diagnosis present

## 2021-05-30 DIAGNOSIS — G40901 Epilepsy, unspecified, not intractable, with status epilepticus: Secondary | ICD-10-CM | POA: Diagnosis not present

## 2021-05-30 DIAGNOSIS — R569 Unspecified convulsions: Secondary | ICD-10-CM

## 2021-05-30 DIAGNOSIS — G40909 Epilepsy, unspecified, not intractable, without status epilepticus: Secondary | ICD-10-CM | POA: Diagnosis not present

## 2021-05-30 LAB — I-STAT ARTERIAL BLOOD GAS, ED
Acid-base deficit: 2 mmol/L (ref 0.0–2.0)
Bicarbonate: 23.1 mmol/L (ref 20.0–28.0)
Calcium, Ion: 1.29 mmol/L (ref 1.15–1.40)
HCT: 35 % — ABNORMAL LOW (ref 39.0–52.0)
Hemoglobin: 11.9 g/dL — ABNORMAL LOW (ref 13.0–17.0)
O2 Saturation: 100 %
Patient temperature: 98.6
Potassium: 4 mmol/L (ref 3.5–5.1)
Sodium: 138 mmol/L (ref 135–145)
TCO2: 24 mmol/L (ref 22–32)
pCO2 arterial: 39.9 mmHg (ref 32–48)
pH, Arterial: 7.369 (ref 7.35–7.45)
pO2, Arterial: 397 mmHg — ABNORMAL HIGH (ref 83–108)

## 2021-05-30 LAB — CBC WITH DIFFERENTIAL/PLATELET
Abs Immature Granulocytes: 0.07 10*3/uL (ref 0.00–0.07)
Basophils Absolute: 0 10*3/uL (ref 0.0–0.1)
Basophils Relative: 0 %
Eosinophils Absolute: 0 10*3/uL (ref 0.0–0.5)
Eosinophils Relative: 0 %
HCT: 39.2 % (ref 39.0–52.0)
Hemoglobin: 12.2 g/dL — ABNORMAL LOW (ref 13.0–17.0)
Immature Granulocytes: 1 %
Lymphocytes Relative: 2 %
Lymphs Abs: 0.3 10*3/uL — ABNORMAL LOW (ref 0.7–4.0)
MCH: 29 pg (ref 26.0–34.0)
MCHC: 31.1 g/dL (ref 30.0–36.0)
MCV: 93.3 fL (ref 80.0–100.0)
Monocytes Absolute: 0.5 10*3/uL (ref 0.1–1.0)
Monocytes Relative: 3 %
Neutro Abs: 14 10*3/uL — ABNORMAL HIGH (ref 1.7–7.7)
Neutrophils Relative %: 94 %
Platelets: 251 10*3/uL (ref 150–400)
RBC: 4.2 MIL/uL — ABNORMAL LOW (ref 4.22–5.81)
RDW: 17.1 % — ABNORMAL HIGH (ref 11.5–15.5)
WBC: 14.9 10*3/uL — ABNORMAL HIGH (ref 4.0–10.5)
nRBC: 0 % (ref 0.0–0.2)

## 2021-05-30 LAB — COMPREHENSIVE METABOLIC PANEL
ALT: 53 U/L — ABNORMAL HIGH (ref 0–44)
AST: 33 U/L (ref 15–41)
Albumin: 3.6 g/dL (ref 3.5–5.0)
Alkaline Phosphatase: 122 U/L (ref 38–126)
Anion gap: 11 (ref 5–15)
BUN: 27 mg/dL — ABNORMAL HIGH (ref 8–23)
CO2: 24 mmol/L (ref 22–32)
Calcium: 9.6 mg/dL (ref 8.9–10.3)
Chloride: 101 mmol/L (ref 98–111)
Creatinine, Ser: 0.88 mg/dL (ref 0.61–1.24)
GFR, Estimated: >60 mL/min (ref >60)
Glucose, Bld: 220 mg/dL — ABNORMAL HIGH (ref 70–99)
Potassium: 4.6 mmol/L (ref 3.5–5.1)
Sodium: 136 mmol/L (ref 135–145)
Total Bilirubin: 0.9 mg/dL (ref 0.3–1.2)
Total Protein: 7.6 g/dL (ref 6.5–8.1)

## 2021-05-30 LAB — URINALYSIS, ROUTINE W REFLEX MICROSCOPIC
Bilirubin Urine: NEGATIVE
Glucose, UA: NEGATIVE mg/dL
Hgb urine dipstick: NEGATIVE
Ketones, ur: NEGATIVE mg/dL
Nitrite: NEGATIVE
Protein, ur: 30 mg/dL — AB
RBC / HPF: >50 RBC/hpf — ABNORMAL HIGH (ref 0–5)
Specific Gravity, Urine: 1.019 (ref 1.005–1.030)
WBC, UA: >50 WBC/hpf — ABNORMAL HIGH (ref 0–5)
pH: 5 (ref 5.0–8.0)

## 2021-05-30 LAB — PROCALCITONIN: Procalcitonin: <0.1 ng/mL

## 2021-05-30 LAB — CBG MONITORING, ED: Glucose-Capillary: 191 mg/dL — ABNORMAL HIGH (ref 70–99)

## 2021-05-30 IMAGING — DX DG CHEST 1V PORT
1 series · 1 of 1 positions shown · non-contrast
Comparison: [DATE]

CLINICAL DATA: Seizures

EXAM:
PORTABLE CHEST 1 VIEW

[chest]
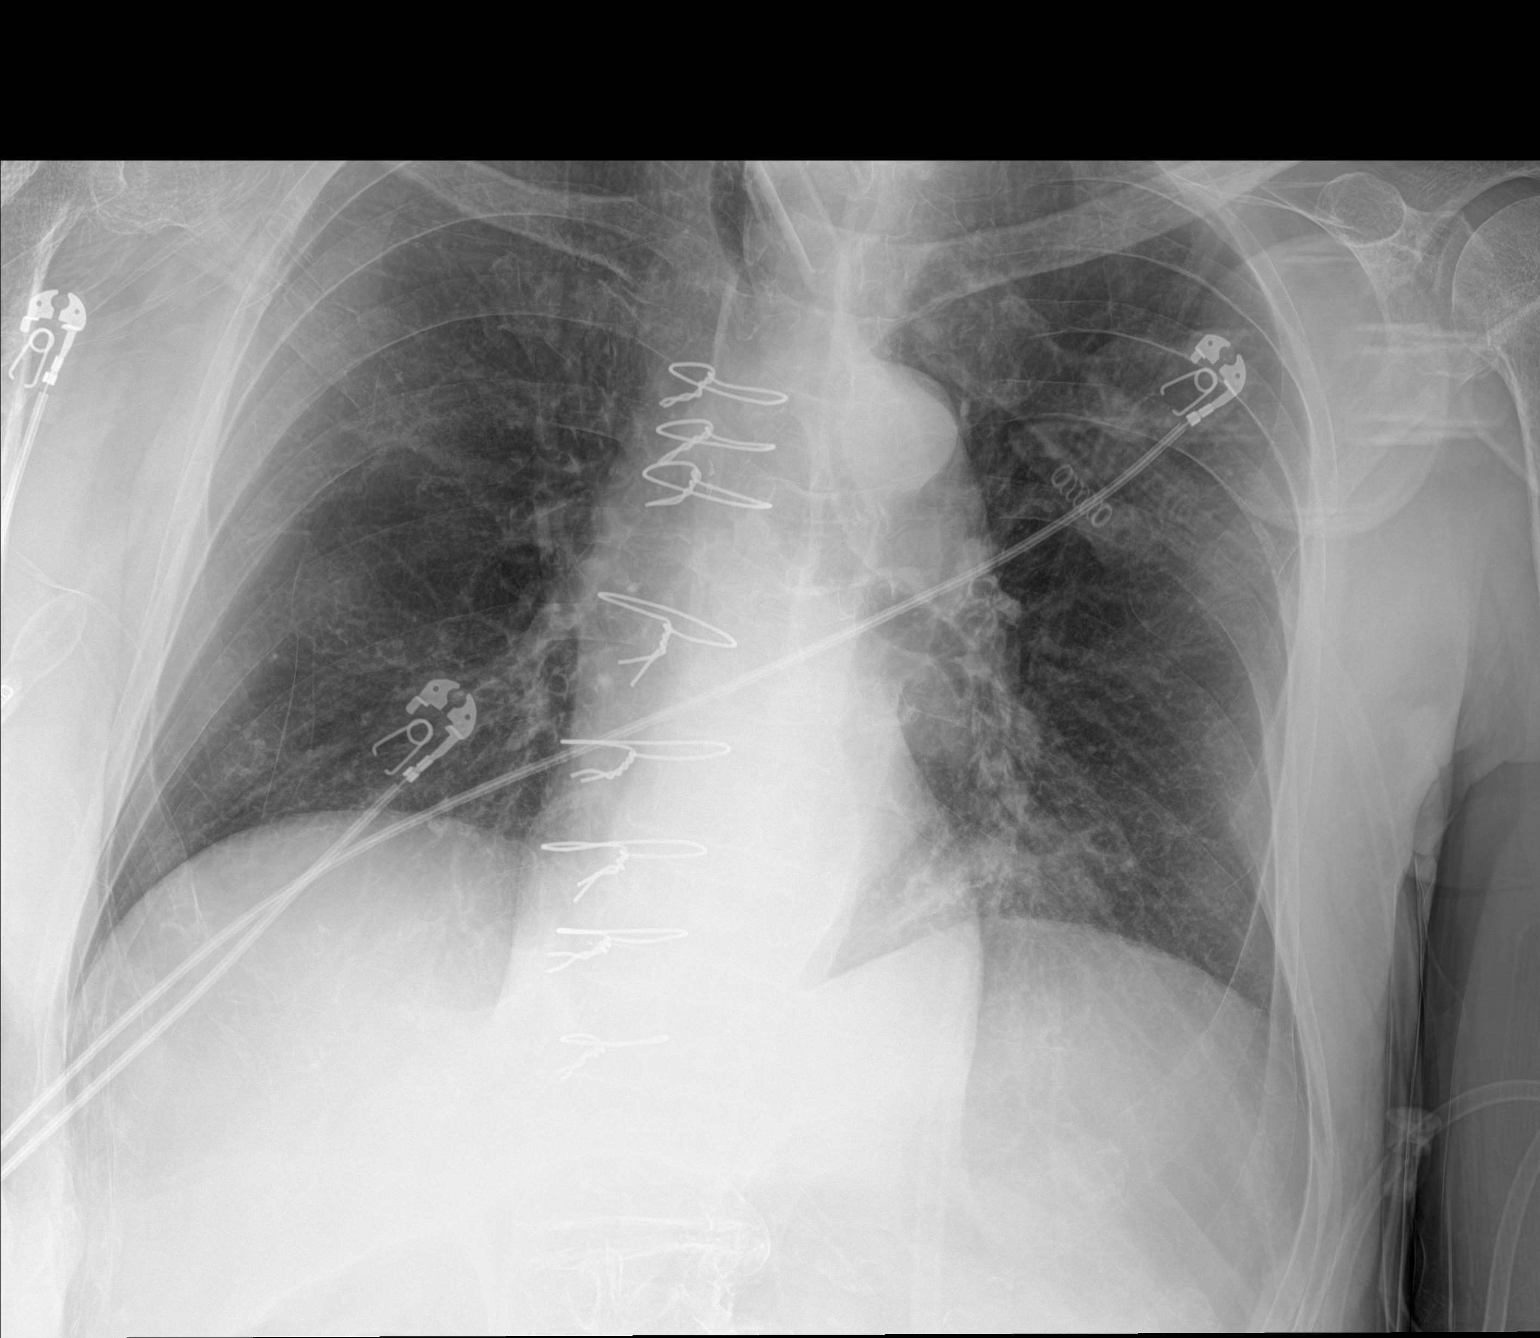

[1 of 1 positions shown; findings below may reference images not displayed]

FINDINGS: Prior CABG. Heart is normal size. Tracheostomy again noted. Left
lower lobe opacity. Right lung clear. No effusions or acute bony
abnormality.
IMPRESSION: Left lower lobe opacity.  Cannot exclude pneumonia.

## 2021-05-30 MED ORDER — ZINC SULFATE 220 (50 ZN) MG PO CAPS
220.0000 mg | ORAL_CAPSULE | Freq: Two times a day (BID) | ORAL | Status: DC
  Administered 2021-05-30 – 2021-05-31 (×3): 220 mg
  Filled 2021-05-30 (×3): qty 1

## 2021-05-30 MED ORDER — LEVETIRACETAM IN NACL 1000 MG/100ML IV SOLN
1000.0000 mg | Freq: Two times a day (BID) | INTRAVENOUS | Status: DC
  Filled 2021-05-30: qty 100

## 2021-05-30 MED ORDER — CEFTRIAXONE SODIUM 1 G IJ SOLR
1.0000 g | INTRAMUSCULAR | Status: DC
Start: 2021-05-31 — End: 2021-05-31
  Administered 2021-05-31: 1 g via INTRAVENOUS
  Filled 2021-05-30: qty 10

## 2021-05-30 MED ORDER — PROPRANOLOL HCL 10 MG PO TABS
10.0000 mg | ORAL_TABLET | Freq: Every day | ORAL | Status: DC
  Administered 2021-05-30 – 2021-05-31 (×2): 10 mg
  Filled 2021-05-30 (×2): qty 1

## 2021-05-30 MED ORDER — OMEGA-3-ACID ETHYL ESTERS 1 G PO CAPS
1000.0000 mg | ORAL_CAPSULE | Freq: Every day | ORAL | Status: DC
  Administered 2021-05-31: 1000 mg
  Filled 2021-05-30 (×3): qty 1

## 2021-05-30 MED ORDER — FOLIC ACID 1 MG PO TABS
1.0000 mg | ORAL_TABLET | Freq: Every day | ORAL | Status: DC
  Administered 2021-05-30 – 2021-05-31 (×2): 1 mg
  Filled 2021-05-30 (×2): qty 1

## 2021-05-30 MED ORDER — GUAIFENESIN 100 MG/5ML PO LIQD
400.0000 mg | Freq: Three times a day (TID) | ORAL | Status: DC
  Administered 2021-05-30 – 2021-05-31 (×4): 400 mg
  Filled 2021-05-30: qty 30
  Filled 2021-05-30 (×3): qty 20
  Filled 2021-05-30 (×2): qty 30

## 2021-05-30 MED ORDER — SODIUM CHLORIDE 0.9 % IV SOLN
75.0000 mL/h | INTRAVENOUS | Status: DC
  Administered 2021-05-30 – 2021-05-31 (×2): 75 mL/h via INTRAVENOUS

## 2021-05-30 MED ORDER — LEVETIRACETAM IN NACL 1000 MG/100ML IV SOLN
1000.0000 mg | Freq: Two times a day (BID) | INTRAVENOUS | Status: DC
Start: 2021-05-30 — End: 2021-05-30

## 2021-05-30 MED ORDER — POLYETHYLENE GLYCOL 3350 17 G PO PACK: 17.0000 g | PACK | ORAL | Status: DC

## 2021-05-30 MED ORDER — ONDANSETRON HCL 4 MG PO TABS: 4.0000 mg | ORAL_TABLET | Freq: Four times a day (QID) | ORAL | Status: DC | PRN

## 2021-05-30 MED ORDER — LEVETIRACETAM 500 MG PO TABS: 1000.0000 mg | ORAL_TABLET | Freq: Two times a day (BID) | ORAL | Status: DC

## 2021-05-30 MED ORDER — LEVALBUTEROL HCL 0.63 MG/3ML IN NEBU
0.6300 mg | INHALATION_SOLUTION | Freq: Two times a day (BID) | RESPIRATORY_TRACT | Status: DC
  Administered 2021-05-30 – 2021-05-31 (×3): 0.63 mg via RESPIRATORY_TRACT
  Filled 2021-05-30 (×3): qty 3

## 2021-05-30 MED ORDER — FUROSEMIDE 20 MG PO TABS: 20.0000 mg | ORAL_TABLET | Freq: Every day | ORAL | Status: DC | PRN

## 2021-05-30 MED ORDER — ASCORBIC ACID 500 MG PO TABS
500.0000 mg | ORAL_TABLET | Freq: Every day | ORAL | Status: DC
  Administered 2021-05-30 – 2021-05-31 (×2): 500 mg
  Filled 2021-05-30 (×2): qty 1

## 2021-05-30 MED ORDER — JEVITY 1.5 CAL/FIBER PO LIQD: 1000.0000 mL | ORAL | Status: DC

## 2021-05-30 MED ORDER — PANTOPRAZOLE 2 MG/ML SUSPENSION
40.0000 mg | Freq: Every day | ORAL | Status: DC
  Administered 2021-05-30 – 2021-05-31 (×2): 40 mg
  Filled 2021-05-30 (×2): qty 20

## 2021-05-30 MED ORDER — LEVETIRACETAM 500 MG PO TABS
1000.0000 mg | ORAL_TABLET | Freq: Two times a day (BID) | ORAL | Status: DC
  Administered 2021-05-30 – 2021-05-31 (×3): 1000 mg
  Filled 2021-05-30 (×3): qty 2

## 2021-05-30 MED ORDER — MIDODRINE HCL 5 MG PO TABS
10.0000 mg | ORAL_TABLET | Freq: Three times a day (TID) | ORAL | Status: DC
  Administered 2021-05-30 – 2021-05-31 (×5): 10 mg
  Filled 2021-05-30 (×6): qty 2

## 2021-05-30 MED ORDER — LEVETIRACETAM IN NACL 1500 MG/100ML IV SOLN
1500.0000 mg | Freq: Once | INTRAVENOUS | Status: AC
  Administered 2021-05-30: 1500 mg via INTRAVENOUS
  Filled 2021-05-30: qty 100

## 2021-05-30 MED ORDER — SALINE SPRAY 0.65 % NA SOLN: 1.0000 | NASAL | Status: DC | PRN

## 2021-05-30 MED ORDER — HYDROXYZINE HCL 25 MG PO TABS
25.0000 mg | ORAL_TABLET | Freq: Four times a day (QID) | ORAL | Status: DC | PRN
  Administered 2021-05-30 – 2021-05-31 (×3): 25 mg
  Filled 2021-05-30 (×3): qty 1

## 2021-05-30 MED ORDER — ALPRAZOLAM 0.5 MG PO TABS
0.5000 mg | ORAL_TABLET | Freq: Three times a day (TID) | ORAL | Status: DC | PRN
  Administered 2021-05-30 – 2021-05-31 (×3): 0.5 mg
  Filled 2021-05-30 (×3): qty 1

## 2021-05-30 MED ORDER — ACETAMINOPHEN 325 MG PO TABS
650.0000 mg | ORAL_TABLET | Freq: Three times a day (TID) | ORAL | Status: DC | PRN
  Administered 2021-05-30 – 2021-05-31 (×3): 650 mg
  Filled 2021-05-30 (×3): qty 2

## 2021-05-30 MED ORDER — ORAL CARE MOUTH RINSE
15.0000 mL | OROMUCOSAL | Status: DC
  Administered 2021-05-30 – 2021-05-31 (×9): 15 mL via OROMUCOSAL

## 2021-05-30 MED ORDER — ESCITALOPRAM OXALATE 10 MG PO TABS
10.0000 mg | ORAL_TABLET | Freq: Every day | ORAL | Status: DC
  Administered 2021-05-30 – 2021-05-31 (×2): 10 mg
  Filled 2021-05-30 (×2): qty 1

## 2021-05-30 MED ORDER — SODIUM CHLORIDE 0.9 % IV SOLN
1.0000 g | Freq: Once | INTRAVENOUS | Status: AC
Start: 2021-05-30 — End: 2021-05-30
  Administered 2021-05-30: 1 g via INTRAVENOUS
  Filled 2021-05-30: qty 10

## 2021-05-30 MED ORDER — FLUTICASONE PROPIONATE 50 MCG/ACT NA SUSP
2.0000 | NASAL | Status: DC
  Administered 2021-05-30: 2 via NASAL
  Filled 2021-05-30: qty 16

## 2021-05-30 MED ORDER — CHLORHEXIDINE GLUCONATE 0.12% ORAL RINSE (MEDLINE KIT)
15.0000 mL | Freq: Two times a day (BID) | OROMUCOSAL | Status: DC
  Administered 2021-05-30 – 2021-05-31 (×2): 15 mL via OROMUCOSAL

## 2021-05-30 MED ORDER — LORATADINE 10 MG PO TABS
10.0000 mg | ORAL_TABLET | Freq: Every day | ORAL | Status: DC
  Administered 2021-05-30 – 2021-05-31 (×2): 10 mg via ORAL
  Filled 2021-05-30 (×2): qty 1

## 2021-05-30 MED ORDER — LACTULOSE 10 GM/15ML PO SOLN: 20.0000 g | Freq: Two times a day (BID) | ORAL | Status: DC | PRN

## 2021-05-30 MED ORDER — CHLORHEXIDINE GLUCONATE CLOTH 2 % EX PADS
6.0000 | MEDICATED_PAD | Freq: Every day | CUTANEOUS | Status: DC
  Administered 2021-05-30 – 2021-05-31 (×2): 6 via TOPICAL

## 2021-05-30 MED ORDER — TAMSULOSIN HCL 0.4 MG PO CAPS: 0.4000 mg | ORAL_CAPSULE | Freq: Every day | ORAL | Status: DC

## 2021-05-30 MED ORDER — LEVOTHYROXINE SODIUM 25 MCG PO TABS
25.0000 ug | ORAL_TABLET | Freq: Every day | ORAL | Status: DC
  Administered 2021-05-30 – 2021-05-31 (×2): 25 ug
  Filled 2021-05-30 (×2): qty 1

## 2021-05-30 MED ORDER — ESOMEPRAZOLE MAGNESIUM 40 MG PO PACK: 40.0000 mg | PACK | Freq: Every day | ORAL | Status: DC

## 2021-05-30 MED ORDER — ENOXAPARIN SODIUM 40 MG/0.4ML IJ SOSY
40.0000 mg | PREFILLED_SYRINGE | INTRAMUSCULAR | Status: DC
  Administered 2021-05-30: 40 mg via SUBCUTANEOUS
  Filled 2021-05-30: qty 0.4

## 2021-05-30 NOTE — Procedures (Signed)
Patient Name: DIANNE BADY  ?MRN: [id number]  ?Epilepsy Attending: Lora Havens  ?Referring Physician/Provider: Donnetta Simpers, MD ?Date: 05/30/2021 ?Duration: 23 mins ? ?Patient history: 81 y.o. male with PMH significant for remor, paroxysmal atrial fibrillation not on anticoagulation, presumed seizures on Keppra 750mg  BID who presents from his LTAC with atleast a 40 mins episode concerning for prolonged seizures x 2.  EEG to evaluate for seizure. ? ?Level of alertness: Awake ? ?AEDs during EEG study: LEV ? ?Technical aspects: This EEG study was done with scalp electrodes positioned according to the 10-20 International system of electrode placement. Electrical activity was acquired at a sampling rate of 500Hz  and reviewed with a high frequency filter of 70Hz  and a low frequency filter of 1Hz . EEG data were recorded continuously and digitally stored.  ? ?Description: The posterior dominant rhythm consists of 8 Hz activity of moderate voltage (25-35 uV) seen predominantly in posterior head regions, symmetric and reactive to eye opening and eye closing. Hyperventilation and photic stimulation were not performed.    ? ?Patient was noted to have near continuous mouth tremor throughout the study. Concomitant EEG before, during and after the event did not show any EEG change to suggest seizure. ?  ?IMPRESSION: ?This study is within normal limits. No seizures or epileptiform discharges were seen throughout the recording. ?  ?Patient was noted to have mouth tremor throughout the study without concomitant EEG change.  These episodes were nonepileptic. ?  ?Lora Havens  ?

## 2021-05-30 NOTE — Progress Notes (Signed)
Pt transported from [facility] to room 31 in the ED without any complication.  ?

## 2021-05-30 NOTE — Plan of Care (Signed)
?  Problem: Role Relationship: ?Goal: Method of communication will improve ?Outcome: Progressing ?  ?

## 2021-05-30 NOTE — Assessment & Plan Note (Signed)
Stable rate - regular on telemetry and exam. ? ?Plan Continue LTAC regimen ?

## 2021-05-30 NOTE — Assessment & Plan Note (Signed)
Patient has seen oncology. He is a poor candidate for invasive diagnostics, e.g. biopsy nor is he a good candidate for treatment if the nodule is malignant. ? ?Plan As already determined - f/u CT October ?

## 2021-05-30 NOTE — Assessment & Plan Note (Signed)
Patient with marked symptoms, exacerbated by inability to talk 2/2 trach. ? ?Plan  Continue current medications ?

## 2021-05-30 NOTE — Plan of Care (Signed)
Neurology plan of care ? ?Please see Dr. Bartholome Bill consult note from this AM. rEEG WNL. UA c/w UTI. No further neurologic workup indicated at this time. Continue keppra 1000mg  bid at discharge. Plan discussed at length with daughter by phone. Patient may f/u with established outpatient neurologist. Neurology to sign off, but please re-engage if additional neurologic concerns arise. ? ?Su Monks, MD ?[facility] ?[phone] ? ?If 7pm- 7am, please page neurology on call as listed in [place]. ? ?

## 2021-05-30 NOTE — Assessment & Plan Note (Signed)
At last admission 05/18/21 patient treated for aspiration pneumonia. He had positive changes on CT chest. He completed course of Zyvox and cefepime. At admission he is afebrile, no productive cough, Lungs seem clear, minimal leukocytosis. CXR with possible change LLL - not definitive for PNA. ? ?Plan abx stewardship dictates not restarting abx w/o additional evidence for recurrent bacterial infection ? For productive cough, fever, progressive leukocytosis - repeat CT chest. ?

## 2021-05-30 NOTE — Assessment & Plan Note (Signed)
Patient with COPD - no evidence of respiratory change. He is vent dependent. O2 levels OK ? ?Plan Continue current vent setting ? Continue medical regimen as at [facility] ?

## 2021-05-30 NOTE — Assessment & Plan Note (Signed)
Continue nutritional supplementation via PEG ?

## 2021-05-30 NOTE — Assessment & Plan Note (Signed)
Continue statin but give consideration to stopping medical treatment given his co-morbidities and poor prognosis ?

## 2021-05-30 NOTE — Assessment & Plan Note (Signed)
No evidence of problems with trach collar or cureent vent settings. ? ?Plan Pulmonary consult as needed ?

## 2021-05-30 NOTE — H&P (Addendum)
?History and Physical  ? ? ?READE TREFZ DQQ:[id number] DOB: 1940-08-02 DOA: 05/29/2021 ? ?DOS: the patient was seen and examined on 05/29/2021 ? ?PCP: Aaron Edelman, MD  ? ?Patient coming from:  [facility] ? ?I have personally briefly reviewed patient's old medical records in [place] ? ?Austin Davis is a 81 y.o. 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  He resides at [facility] facility. He was recnetly admitted for possible seizure vs hypoxemic related change in consciousness. He was also treated for aspiration pneumonia, VRE UTI. He was started on Keppra for seizure. ? ?He returned to [facility] and has continued to have symptoms suggestive of seizure with staring unresponsive episodes. Keppra dose was increased from 500 to 750. On day of admission he had recurrent episodes and was returned to [facility] for evaluation of continued seizure.  ?  ? ?ED Course: vitals noted. Vent functioning. UA remains positive. Mild leukocytosis to  14.9  94/2/3. Patient seen by neurology who recommended increasing Keppra to 1,000 and having TRH admit to observation to watch for further seizures. ? ?Review of Systems:  ?Review of Systems  ?Unable to perform ROS: Intubated  ? ?Past Medical History:  ?Diagnosis Date  ? CHF (congestive heart failure) ([place])   ? Coronary artery disease   ? Essential tremor   ? GI bleed 2019  ? hospitalized at [facility] for one week  ? Headache   ? since childhood  ? Low blood sugar   ? since childhood, controlled by diet  ? Mitral regurgitation   ? Mitral valve prolapse   ? Osteopenia 2021  ? Paroxysmal atrial fibrillation (HCC)   ? ? ?Past Surgical History:  ?Procedure Laterality Date  ? COLONOSCOPY WITH PROPOFOL N/A 02/19/2017  ? Procedure: COLONOSCOPY WITH PROPOFOL;  Surgeon: Wilford Corner, MD;   Location: [facility];  Service: Endoscopy;  Laterality: N/A;  ? IR GASTROSTOMY TUBE MOD SED  10/18/2020  ? IR IVC FILTER PLMT / S&I /IMG GUID/MOD SED  11/01/2020  ? laser eye surgery for retina detachment    ? MITRAL VALVE REPAIR  01/2003  ? monitor  02/05/2006  ? polyp removal    ? TONSILLECTOMY    ? tooth removal    ? as a teenager  ? TRACHEOSTOMY TUBE PLACEMENT N/A 10/26/2020  ? Procedure: TRACHEOSTOMY;  Surgeon: Rozetta Nunnery, MD;  Location: [place];  Service: ENT;  Laterality: N/A;  ? ? ?Soc hx - unable to gather information from patient.  ? ? reports that he has never smoked. He has never used smokeless tobacco. He reports current alcohol use. He reports that he does not use drugs. ? ?Allergies  ?Allergen Reactions  ? Prednisone Other (See Comments)  ?  Makes skin crawl, rapid HR  ? Cortisone Palpitations  ? ? ?Family History  ?Problem Relation Age of Onset  ? Heart disease Mother   ? CAD Mother   ? Migraines Mother   ? Heart failure Father   ? Skin cancer Father   ? Valvular heart disease Father   ?     prolapsed valve  ? Tremor Father   ?     "probably had a bit of tremor in his old age"  ? Heart failure Maternal Grandmother   ? Pneumonia Maternal Grandfather   ? Heart failure Paternal Grandmother   ? Stroke  Paternal Grandfather   ? Asthma Daughter   ? Epilepsy Son   ? Parkinson's disease Neg Hx   ? ? ?Prior to Admission medications   ?Medication Sig Start Date End Date Taking? Authorizing Provider  ?acetaminophen (TYLENOL) 325 MG tablet Place 650 mg into feeding tube every 8 (eight) hours as needed for moderate pain.   Yes [provider]  ?ALPRAZolam (XANAX) 0.5 MG tablet Place 1 tablet (0.5 mg total) into feeding tube every 8 (eight) hours as needed for anxiety. 05/10/21  Yes Raiford Noble [place], DO  ?Cholecalciferol (VITAMIN D3 SUPER STRENGTH) 50 MCG (2000 UT) TABS Give 2,000 Units by tube daily.   Yes [provider]  ?escitalopram (LEXAPRO) 10 MG tablet Place 10 mg into feeding  tube daily.   Yes [provider]  ?esomeprazole (NEXIUM) 40 MG packet 40 mg daily. Per G tube   Yes [provider]  ?fexofenadine (ALLEGRA) 180 MG tablet Place 180 mg into feeding tube daily.   Yes [provider]  ?fluticasone (FLONASE) 50 MCG/ACT nasal spray Place 2 sprays into both nostrils in the morning and at bedtime.   Yes [provider]  ?folic acid (FOLVITE) 1 MG tablet Take 1 tablet (1 mg total) by mouth daily. ?Patient taking differently: Place 1 mg into feeding tube daily. 09/29/20  Yes [name], MD  ?furosemide (LASIX) 20 MG tablet Place 1 tablet (20 mg total) into feeding tube daily as needed for fluid or edema. 04/16/21  Yes [name], DO  ?guaiFENesin (ROBITUSSIN) 100 MG/5ML liquid Place 400 mg into feeding tube 3 (three) times daily.   Yes [provider]  ?hydrOXYzine (ATARAX) 25 MG tablet Place 25 mg into feeding tube every 6 (six) hours as needed for anxiety.   Yes [provider]  ?lactulose (CHRONULAC) 10 GM/15ML solution Place 20 g into feeding tube 2 (two) times daily as needed for mild constipation.   Yes [provider]  ?levalbuterol (XOPENEX HFA) 45 MCG/ACT inhaler Inhale 1 puff into the lungs in the morning and at bedtime.   Yes [provider]  ?levalbuterol (XOPENEX) 1.25 MG/3ML nebulizer solution Take 1.25 mg by nebulization in the morning and at bedtime.   Yes [provider]  ?levETIRAcetam (KEPPRA) 100 MG/ML solution Place 7.5 mLs (750 mg total) into feeding tube 2 (two) times daily. 05/07/21 07/06/21 Yes Trifan, Carola Rhine, MD  ?levothyroxine (SYNTHROID) 25 MCG tablet Place 25 mcg into feeding tube daily before breakfast.   Yes [provider]  ?midodrine (PROAMATINE) 10 MG tablet Place 1 tablet (10 mg total) into feeding tube 3 (three) times daily with meals. 05/01/21  Yes Thurnell Lose, MD  ?Multiple Vitamin (MULTIVITAMIN WITH MINERALS) TABS tablet Place 1 tablet into  feeding tube daily. 05/11/21  Yes Raiford Noble [place], DO  ?Nutritional Supplements (FEEDING SUPPLEMENT, JEVITY 1.5 CAL/FIBER,) LIQD Place 1,000 mLs into feeding tube continuous. 05/10/21  Yes Raiford Noble [place], DO  ?Omega-3 Fatty Acids (SUPER OMEGA 3 EPA/DHA) 1000 MG CAPS Give 1,000 mg by tube daily.   Yes [provider]  ?ondansetron (ZOFRAN) 4 MG tablet Take 1 tablet (4 mg total) by mouth every 6 (six) hours as needed for nausea. 05/10/21  Yes Sheikh, Omair Latif, DO  ?polyethylene glycol powder (GLYCOLAX/MIRALAX) 17 GM/SCOOP powder Place 17 g into feeding tube every other day.   Yes [provider]  ?Probiotic Product (CULTURELLE PROBIOTICS PO) Give 1 capsule by tube daily. 10 billion cell   Yes  [provider]  ?propranolol (INDERAL) 10 MG tablet Place 1 tablet (10 mg total) into feeding tube daily as needed (for tremors in AM. do not administer if heart rate is below 60 or systolic blood pressure is below 90.). ?Patient taking differently: Place 10 mg into feeding tube daily. Do not administer if heart rate is less than 60 or systolic BP is less than 90 05/01/21  Yes Thurnell Lose, MD  ?sodium chloride (OCEAN) 0.65 % SOLN nasal spray Place 1 spray into both nostrils every 4 (four) hours as needed for congestion.   Yes [provider]  ?tamsulosin (FLOMAX) 0.4 MG CAPS capsule Take 0.4 mg by mouth at bedtime.   Yes [provider]  ?vitamin C (ASCORBIC ACID) 500 MG tablet Place 500 mg into feeding tube daily.   Yes [provider]  ?zinc gluconate 50 MG tablet Place 50 mg into feeding tube 2 (two) times daily.   Yes [provider]  ?ceFEPime (MAXIPIME) IVPB Inject 2 g into the vein every 8 (eight) hours. Indication:  HAP ?First Dose: No ?Last Day of Therapy:  05/14/21 ?Labs - Once weekly:  CBC/D and BMP, ?Labs - Every other week:  ESR and CRP ?Method of administration: IV Push ?Method of administration may be changed at the discretion of home  infusion pharmacist based upon assessment of the patient and/or caregiver's ability to self-administer the medication ordered. ?Patient not taking: Reported on 05/30/2021 05/10/21   Raiford Noble Latif, DO  ?clonazePAM (

## 2021-05-30 NOTE — Progress Notes (Signed)
This is a 81 year old gentleman with multiple medical problems who presented from [place] with concern of possible seizure.  Admitted under hospitalist service.  Seen by neurology, Keppra increased from 500 mg p.o. twice daily to 750 mg p.o. twice daily.  Admitted under hospitalist service only for observation for 24 hours.  There was some concern about left lower lobe pneumonia on the chest x-ray however patient having no pneumonia symptoms such as shortness of breath, fever, tachypnea and low procalcitonin, he does not appear to have pneumonia clinically and based on the antibiotic stewardship, antibiotics were not started which I totally agree with.  Patient seen and examined in the ED.  Patient has constant and rapid jaw tremors and tongue tremors.  Patient has tracheostomy with PEG tube placement.  Patient is unable to talk.  However he appears to be understanding this very well and following commands very well.  He denies any shortness of breath.  He was very sweaty on my examination.  Otherwise he was stable.  I called and had a lengthy discussion with his daughter [name] over the phone for about 15 minutes.  There was some discrepancy in the notes during his recent hospitalization, according to palliative care notes, he was DNR but according to hospitalist note, he was full code.  I discussed the situation with the daughter.  She appeared to be confused and somewhat frustrated because she says that the last time when the patient was in the hospital, nobody would talk to her and that every time she would ask question, she was answered with an attitude and nonverbal cues that she was asking too many unnecessary and stupid questions.  She explained that she has talked to palliative care at multiple times including Dr. Hilma Favors.  Nonetheless, to make the story short, she told me that the patient would not want chest compressions but would want everything else so he will be a partial code.  She was informed  about the current situation, why patient is here and how the plan is, which is that if he were not to have any further seizures by tomorrow, he will be discharged on increased dose of Keppra.  She had further questions about why he has seizures, I told her that I would send a message to our neurologist and request them to give her a call so they can answer her questions directly.  She was very appreciative of the call. ?

## 2021-05-30 NOTE — Assessment & Plan Note (Signed)
Had VRE UTI and was treated with full course of Vyox. Now with persistently positive urinalysis. Question of colonization. No fever, mild leukocytosis. ? ?Plan No abx at this time. For fever, progressive leukocytosis reconsider Abx. ?

## 2021-05-30 NOTE — Progress Notes (Signed)
EEG complete - results pending 

## 2021-05-30 NOTE — Subjective & Objective (Addendum)
Austin Davis is a 81 y.o. 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  He resides at [facility] facility. He was recnetly admitted for possible seizure vs hypoxemic related change in consciousness. He was also treated for aspiration pneumonia, VRE UTI. He was started on Keppra for seizure. ? ?He returned to [facility] and has continued to have symptoms suggestive of seizure with staring unresponsive episodes. Keppra dose was increased from 500 to 750. On day of admission he had recurrent episodes and was returned to [facility] for evaluation of continued seizure.  ?

## 2021-05-30 NOTE — Progress Notes (Signed)
Pt transported from ED to 4N15 without any complications.  ?

## 2021-05-30 NOTE — Consult Note (Signed)
NEUROLOGY CONSULTATION NOTE  ? ?Date of service: May 30, 2021 ?Patient Name: Austin Davis ?MRN:  [id number] ?DOB:  Feb 08, 1940 ?Reason for consult: "Prolonged seizures, now following commands" ?Requesting Provider: [name], * ?_ _ _   _ __   _ __ _ _  __ __   _ __   __ _ ? ?History of Present Illness  ?Austin Davis is a 81 y.o. male with PMH significant for tremor, paroxysmal atrial fibrillation not on anticoagulation, presumed seizures on Keppra 750mg  BID who presents from his LTAC with atleast a 40 mins episode concerning for a seizure. ? ?I spoke to his RN at [facility] who reports that aide noted Austin Davis to be starring off in the space and unresponsive. He was diaphoretic. He has tremors that they are aware is his baseline but this was different. He was unresponsive to physical and verbal stimuli. Eyes were open. He was unarousable. He has foleys so unclear if he lost control of bladder, he did not have a bowel movement. ? ?Rn reports that earlier in the afternoon around 1500, he had a 20 mins seizure where he bit his tongue and lip with dried blood in his mouth. They were eventually able to get some Ativan. ? ?He is on Keppra 750mg  BID, he has not missed any doses. ? ?Here, patient is awake, alert, has a trach with no speech valve. He is unaware that he had seizures today. ? ?He was seen by our team in April 2023 after seizure like epiodes, cEEG was unable to capture any spells, no epileptiform discharges. He had MRI Brain which which was normal. ? ?Workup here with left lower lobe opacity on CXR, UA concerning for a UTI. ?  ?ROS  ? ?Unable to obtain, he is trached with no speech valve. ? ?Past History  ? ?Past Medical History:  ?Diagnosis Date  ? CHF (congestive heart failure) ([place])   ? Coronary artery disease   ? Essential tremor   ? GI bleed 2019  ? hospitalized at [facility] for one week  ? Headache   ? since childhood  ? Low blood sugar   ? since childhood, controlled by diet  ?  Mitral regurgitation   ? Mitral valve prolapse   ? Osteopenia 2021  ? Paroxysmal atrial fibrillation (HCC)   ? ?Past Surgical History:  ?Procedure Laterality Date  ? COLONOSCOPY WITH PROPOFOL N/A 02/19/2017  ? Procedure: COLONOSCOPY WITH PROPOFOL;  Surgeon: Wilford Corner, MD;  [facility];  Service: Endoscopy;  Laterality: N/A;  ? IR GASTROSTOMY TUBE MOD SED  10/18/2020  ? IR IVC FILTER PLMT / S&I /IMG GUID/MOD SED  11/01/2020  ? laser eye surgery for retina detachment    ? MITRAL VALVE REPAIR  01/2003  ? monitor  02/05/2006  ? polyp removal    ? TONSILLECTOMY    ? tooth removal    ? as a teenager  ? TRACHEOSTOMY TUBE PLACEMENT N/A 10/26/2020  ? Procedure: TRACHEOSTOMY;  Surgeon: Rozetta Nunnery, MD;  Location: [place];  Service: ENT;  Laterality: N/A;  ? ?Family History  ?Problem Relation Age of Onset  ? Heart disease Mother   ? CAD Mother   ? Migraines Mother   ? Heart failure Father   ? Skin cancer Father   ? Valvular heart disease Father   ?     prolapsed valve  ? Tremor Father   ?     "probably had a bit of tremor  in his old age"  ? Heart failure Maternal Grandmother   ? Pneumonia Maternal Grandfather   ? Heart failure Paternal Grandmother   ? Stroke Paternal Grandfather   ? Asthma Daughter   ? Epilepsy Son   ? Parkinson's disease Neg Hx   ? ?Social History  ? ?Socioeconomic History  ? Marital status: Single  ?  Spouse name: Not on file  ? Number of children: 2  ? Years of education: 84  ? Highest education level: Not on file  ?Occupational History  ? Occupation: [employer]  ?Tobacco Use  ? Smoking status: Never  ? Smokeless tobacco: Never  ? Tobacco comments:  ?  only tried a few cigarettes when he was younger  ?Substance and Sexual Activity  ? Alcohol use: Yes  ?  Comment: "probably a 6 pack of beer a year"  ? Drug use: No  ? Sexual activity: Not on file  ?Other Topics Concern  ? Not on file  ?Social History Narrative  ? Lives with girlfriend  ? Caffeine use: rarely   ? Right handed   ? ?Social Determinants of Health  ? ?Financial Resource Strain: Not on file  ?Food Insecurity: Not on file  ?Transportation Needs: Not on file  ?Physical Activity: Not on file  ?Stress: Not on file  ?Social Connections: Not on file  ? ?Allergies  ?Allergen Reactions  ? Prednisone Other (See Comments)  ?  Makes skin crawl, rapid HR  ? Cortisone Palpitations  ? ? ?Medications  ?(Not in a hospital admission) ?  ? ?Vitals  ? ?Vitals:  ? 05/30/21 0100 05/30/21 0115 05/30/21 0130 05/30/21 0200  ?BP: 95/67 90/65 91/68  95/68  ?Pulse: 87 88 89 86  ?Resp: (!) 24 (!) 28 (!) 24 (!) 21  ?Temp:      ?TempSrc:      ?SpO2: 100% 100% 100% 100%  ?  ? ?There is no height or weight on file to calculate BMI. ? ?Physical Exam  ? ?General: Laying comfortably in bed; in no acute distress.  ?HENT: Normal oropharynx and mucosa. Normal external appearance of ears and nose.  ?Neck: Supple, no pain or tenderness. Trach in place. ?CV: No JVD. No peripheral edema.  ?Pulmonary: Symmetric Chest rise. Normal respiratory effort.  ?Abdomen: Soft to touch, non-tender.  ?Ext: No cyanosis, edema, or deformity  ?Skin: No rash. Normal palpation of skin.   ?Musculoskeletal: Normal digits and nails by inspection. No clubbing.  ? ?Neurologic Examination  ?Mental status/Cognition: Alert, follows commands. ?Speech/language: comprehension intact, no speech as he has trach in place with no speech valve. ?Cranial nerves:  ? CN II Pupils equal and reactive to light, no VF deficits  ? CN III,IV,VI EOM intact, no gaze preference or deviation, no nystagmus   ? CN V normal sensation in V1, V2, and V3 segments bilaterally   ? CN VII no asymmetry, no nasolabial fold flattening   ? CN VIII Turns head towards speech  ? CN IX & X normal palatal elevation, no uvular deviation   ? CN XI 5/5 head turn and 5/5 shoulder shrug bilaterally   ? CN XII midline tongue protrusion  ? ?Motor:  ?Muscle bulk: poor, tone normal, pronator drift none tremor significant intentional tremors  BL. ?Mvmt Root Nerve  Muscle Right Left Comments  ?SA C5/6 Ax Deltoid     ?EF C5/6 Mc Biceps 5 5   ?EE C6/7/8 Rad Triceps 5 5   ?WF C6/7 Med FCR     ?  WE C7/8 PIN ECU     ?F Ab C8/T1 U ADM/FDI 5 5   ?HF L1/2/3 Fem Illopsoas 5 5   ?KE L2/3/4 Fem Quad     ?DF L4/5 D Peron Tib Ant 5 5   ?PF S1/2 Tibial Grc/Sol 5 5   ? ?Sensation: ? Light touch Intact throughout  ? Pin prick   ? Temperature   ? Vibration   ?Proprioception   ? ?Coordination/Complex Motor:  ?- Finger to Nose intact BL althou with significant tremors. ? ?Labs  ? ?CBC:  ?Recent Labs  ?Lab 05/30/21 ?0030 05/30/21 ?0052  ?WBC 14.9*  --   ?NEUTROABS 14.0*  --   ?HGB 12.2* 11.9*  ?HCT 39.2 35.0*  ?MCV 93.3  --   ?PLT 251  --   ? ? ?Basic Metabolic Panel:  ?Lab Results  ?Component Value Date  ? NA 138 05/30/2021  ? K 4.0 05/30/2021  ? CO2 24 05/30/2021  ? GLUCOSE 220 (H) 05/30/2021  ? BUN 27 (H) 05/30/2021  ? CREATININE 0.88 05/30/2021  ? CALCIUM 9.6 05/30/2021  ? GFRNONAA >60 05/30/2021  ? GFRAA >60 02/17/2017  ? ?Lipid Panel:  ?Lab Results  ?Component Value Date  ? [place] 110 01/30/2015  ? ?HgbA1c:  ?Lab Results  ?Component Value Date  ? HGBA1C 5.5 04/11/2021  ? ?Urine Drug Screen: No results found for: LABOPIA, COCAINSCRNUR, [place], [place], THCU, LABBARB  ?Alcohol Level No results found for: ETH ? ?rEEG:  ?pending ? ?Impression  ? ?Austin Davis is a 81 y.o. male with PMH significant for remor, paroxysmal atrial fibrillation not on anticoagulation, presumed seizures on Keppra 750mg  BID who presents from his LTAC with atleast a 40 mins episode concerning for prolonged seizures x 2. He is back to his baseline with no focal deficit. The description of the episode provided by staff at the facility coupled with the dried blood in mouth is concerning for epileptic seizure. Suspect that this happened in the setting of a potential UTI. He did not miss any doses of his AEDs. We recently did cEEG on him but were unable to capture an event. Will get a routine EEG  on him. Will also add rescue Valtoco for prolonged seizure. ? ?Recommendations  ?- Keppra increased to 1000mg  BID. ?- Observe overnight ?- routine EEG ?- Valtoco 7.5mg  in each nostril for a total of 15mg

## 2021-05-30 NOTE — Assessment & Plan Note (Signed)
Patient with newly diagnosed seizure in April. At admission 4/19-4/21/23 the question was raised as to whether his episodes were related to hypoxemia. He was started on Keppra. He seemed to respond with this and readjustment of trach collar and vent. Also with treatment for possible infection.  ? ?At [facility] facility he was noted to have recurrent episodes of staring and being unresponsive. Keppra was increased. On the Day of admission he had two episodes: 20 min and 40 min of staring and being unresponsive leading to transport [facility] for evaluation. ? ?Seen by neurology - in ED - recommendation was to increase Keppra to 1,000 mg and observe for any additional episode.  ?

## 2021-05-30 NOTE — Assessment & Plan Note (Signed)
Patient with multiple co-morbidities, vent dependent, Severe COPD, Parkinson's disease . He cannot communicate without word board. He has been DNR in the past but last admissions full code. ? ?Plan Continue palliative care consult and goals of care clairifcation ?

## 2021-05-31 DIAGNOSIS — J9503 Malfunction of tracheostomy stoma: Secondary | ICD-10-CM | POA: Diagnosis not present

## 2021-05-31 DIAGNOSIS — E43 Unspecified severe protein-calorie malnutrition: Secondary | ICD-10-CM | POA: Diagnosis not present

## 2021-05-31 DIAGNOSIS — R9431 Abnormal electrocardiogram [ECG] [EKG]: Secondary | ICD-10-CM | POA: Diagnosis not present

## 2021-05-31 DIAGNOSIS — R4182 Altered mental status, unspecified: Secondary | ICD-10-CM | POA: Diagnosis not present

## 2021-05-31 DIAGNOSIS — R6889 Other general symptoms and signs: Secondary | ICD-10-CM

## 2021-05-31 DIAGNOSIS — G40909 Epilepsy, unspecified, not intractable, without status epilepticus: Secondary | ICD-10-CM | POA: Diagnosis not present

## 2021-05-31 DIAGNOSIS — G9341 Metabolic encephalopathy: Secondary | ICD-10-CM | POA: Diagnosis not present

## 2021-05-31 DIAGNOSIS — Z430 Encounter for attention to tracheostomy: Secondary | ICD-10-CM | POA: Diagnosis not present

## 2021-05-31 DIAGNOSIS — Z431 Encounter for attention to gastrostomy: Secondary | ICD-10-CM | POA: Diagnosis not present

## 2021-05-31 DIAGNOSIS — I5022 Chronic systolic (congestive) heart failure: Secondary | ICD-10-CM | POA: Diagnosis not present

## 2021-05-31 DIAGNOSIS — Z9911 Dependence on respirator [ventilator] status: Secondary | ICD-10-CM

## 2021-05-31 DIAGNOSIS — I2510 Atherosclerotic heart disease of native coronary artery without angina pectoris: Secondary | ICD-10-CM | POA: Diagnosis not present

## 2021-05-31 DIAGNOSIS — Z931 Gastrostomy status: Secondary | ICD-10-CM | POA: Diagnosis not present

## 2021-05-31 DIAGNOSIS — F418 Other specified anxiety disorders: Secondary | ICD-10-CM | POA: Diagnosis not present

## 2021-05-31 DIAGNOSIS — F32A Depression, unspecified: Secondary | ICD-10-CM | POA: Diagnosis present

## 2021-05-31 DIAGNOSIS — E039 Hypothyroidism, unspecified: Secondary | ICD-10-CM | POA: Diagnosis present

## 2021-05-31 DIAGNOSIS — R911 Solitary pulmonary nodule: Secondary | ICD-10-CM | POA: Diagnosis not present

## 2021-05-31 DIAGNOSIS — I469 Cardiac arrest, cause unspecified: Secondary | ICD-10-CM | POA: Diagnosis not present

## 2021-05-31 DIAGNOSIS — I468 Cardiac arrest due to other underlying condition: Secondary | ICD-10-CM | POA: Diagnosis not present

## 2021-05-31 DIAGNOSIS — Z930 Tracheostomy status: Secondary | ICD-10-CM | POA: Diagnosis not present

## 2021-05-31 DIAGNOSIS — Y846 Urinary catheterization as the cause of abnormal reaction of the patient, or of later complication, without mention of misadventure at the time of the procedure: Secondary | ICD-10-CM | POA: Diagnosis present

## 2021-05-31 DIAGNOSIS — J989 Respiratory disorder, unspecified: Secondary | ICD-10-CM | POA: Diagnosis not present

## 2021-05-31 DIAGNOSIS — Z66 Do not resuscitate: Secondary | ICD-10-CM | POA: Diagnosis not present

## 2021-05-31 DIAGNOSIS — R64 Cachexia: Secondary | ICD-10-CM | POA: Diagnosis present

## 2021-05-31 DIAGNOSIS — J9611 Chronic respiratory failure with hypoxia: Secondary | ICD-10-CM | POA: Diagnosis not present

## 2021-05-31 DIAGNOSIS — Z681 Body mass index (BMI) 19 or less, adult: Secondary | ICD-10-CM | POA: Diagnosis not present

## 2021-05-31 DIAGNOSIS — Z20822 Contact with and (suspected) exposure to covid-19: Secondary | ICD-10-CM | POA: Diagnosis not present

## 2021-05-31 DIAGNOSIS — I480 Paroxysmal atrial fibrillation: Secondary | ICD-10-CM | POA: Diagnosis not present

## 2021-05-31 DIAGNOSIS — R569 Unspecified convulsions: Secondary | ICD-10-CM | POA: Diagnosis not present

## 2021-05-31 DIAGNOSIS — J9621 Acute and chronic respiratory failure with hypoxia: Secondary | ICD-10-CM | POA: Diagnosis not present

## 2021-05-31 DIAGNOSIS — J449 Chronic obstructive pulmonary disease, unspecified: Secondary | ICD-10-CM | POA: Diagnosis not present

## 2021-05-31 DIAGNOSIS — I9589 Other hypotension: Secondary | ICD-10-CM | POA: Diagnosis present

## 2021-05-31 DIAGNOSIS — J9620 Acute and chronic respiratory failure, unspecified whether with hypoxia or hypercapnia: Secondary | ICD-10-CM | POA: Diagnosis not present

## 2021-05-31 DIAGNOSIS — F419 Anxiety disorder, unspecified: Secondary | ICD-10-CM | POA: Diagnosis present

## 2021-05-31 DIAGNOSIS — G20 Parkinson's disease: Secondary | ICD-10-CM

## 2021-05-31 DIAGNOSIS — I5042 Chronic combined systolic (congestive) and diastolic (congestive) heart failure: Secondary | ICD-10-CM | POA: Diagnosis present

## 2021-05-31 DIAGNOSIS — Z86718 Personal history of other venous thrombosis and embolism: Secondary | ICD-10-CM | POA: Diagnosis not present

## 2021-05-31 DIAGNOSIS — J189 Pneumonia, unspecified organism: Secondary | ICD-10-CM | POA: Diagnosis not present

## 2021-05-31 DIAGNOSIS — T83511A Infection and inflammatory reaction due to indwelling urethral catheter, initial encounter: Secondary | ICD-10-CM | POA: Diagnosis not present

## 2021-05-31 DIAGNOSIS — Z515 Encounter for palliative care: Secondary | ICD-10-CM

## 2021-05-31 DIAGNOSIS — N390 Urinary tract infection, site not specified: Secondary | ICD-10-CM | POA: Diagnosis not present

## 2021-05-31 DIAGNOSIS — Z95828 Presence of other vascular implants and grafts: Secondary | ICD-10-CM | POA: Diagnosis not present

## 2021-05-31 DIAGNOSIS — J439 Emphysema, unspecified: Secondary | ICD-10-CM | POA: Diagnosis not present

## 2021-05-31 DIAGNOSIS — Z79899 Other long term (current) drug therapy: Secondary | ICD-10-CM | POA: Diagnosis not present

## 2021-05-31 DIAGNOSIS — L89151 Pressure ulcer of sacral region, stage 1: Secondary | ICD-10-CM | POA: Diagnosis present

## 2021-05-31 LAB — URINE CULTURE: Special Requests: NORMAL

## 2021-05-31 LAB — RESP PANEL BY RT-PCR (FLU A&B, COVID) ARPGX2
Influenza A by PCR: NEGATIVE
Influenza B by PCR: NEGATIVE
SARS Coronavirus 2 by RT PCR: NEGATIVE

## 2021-05-31 LAB — LEVETIRACETAM LEVEL: Levetiracetam Lvl: 27.8 ug/mL (ref 10.0–40.0)

## 2021-05-31 MED ORDER — PROSOURCE TF PO LIQD
45.0000 mL | Freq: Two times a day (BID) | ORAL | Status: DC
  Administered 2021-05-31: 45 mL
  Filled 2021-05-31: qty 45

## 2021-05-31 MED ORDER — JEVITY 1.5 CAL/FIBER PO LIQD
1000.0000 mL | ORAL | Status: DC
  Filled 2021-05-31: qty 1000

## 2021-05-31 MED ORDER — LEVETIRACETAM 1000 MG PO TABS: 1000.0000 mg | ORAL_TABLET | Freq: Two times a day (BID) | ORAL | 0 refills | Status: DC

## 2021-05-31 MED ORDER — SULFAMETHOXAZOLE-TRIMETHOPRIM 800-160 MG PO TABS: 1.0000 | ORAL_TABLET | Freq: Two times a day (BID) | ORAL | 0 refills | Status: AC

## 2021-05-31 MED ORDER — ALPRAZOLAM 0.5 MG PO TABS
0.5000 mg | ORAL_TABLET | Freq: Three times a day (TID) | ORAL | 0 refills | Status: DC | PRN
Start: 2021-05-31 — End: 2021-06-26

## 2021-05-31 NOTE — Progress Notes (Signed)
Brief Palliative Medicine Progress Note: ? ?PMT consult received and chart reviewed.  ? ?Noted patient is set for discharge today. Spoke with Dr. Doristine Bosworth - no urgent needs for PMT at this time. Discussed outpatient Palliative Care can follow for continued GOC.  ? ?Discussed patient with Lexine Baton, NP with PMT at [facility] who can follow for continued GOC. Reviewed recommendation for continued discussions around code status.  ?  ?Thank you for allowing PMT to assist in the care of this patient. ? ?[employer] Tamala Julian, FNP-BC ?Palliative Medicine Team ?Team Phone: [phone] ?NO CHARGE ? ?

## 2021-05-31 NOTE — Discharge Summary (Signed)
PatientPhysician Discharge Summary  ?[name] YIF:[id number] DOB: 10-Nov-1940 DOA: 05/29/2021 ? ?PCP: Austin Edelman, MD ? ?Admit date: 05/29/2021 ?Discharge date: 05/31/2021 ?30 Day Unplanned Readmission Risk Score   ? ?Flowsheet Row ED to Hosp-Admission (Discharged) from 05/08/2021 in [facility] 52[facility]  ?30 Day Unplanned Readmission Risk Score (%) 48.47 Filed at 05/10/2021 1200  ? ?  ? ? This score is the patient's risk of an unplanned readmission within 30 days of being discharged (0 -100%). The score is based on dignosis, age, lab data, medications, orders, and past utilization.   ?Low:  0-14.9   Medium: 15-21.9   High: 22-29.9   Extreme: 30 and above ? ?  ? ?  ? ? ? ?Admitted From: LTAC ?Disposition:  LTAC ? ?Recommendations for Outpatient Follow-up:  ?Follow up with PCP in 1-2 weeks ?Please obtain BMP/CBC in one week ?Please follow up with your PCP on the following pending results: ?Unresulted Labs (From admission, onward)  ? ?  Start     Ordered  ? 06/06/21 0500  Creatinine, serum  (enoxaparin (LOVENOX)    CrCl >/= 30 ml/min)  Weekly,   R     ?Comments: while on enoxaparin therapy ?  ? 05/30/21 0638  ? 05/29/21 2343  Levetiracetam level  Once,   URGENT       ? 05/29/21 2350  ? ?  ?  ? ?  ?  ? ? ?Home Health: None ?Equipment/Devices: None ? ?Discharge Condition: Stable ?CODE STATUS: Partial code/no chest compression but okay with everything else ?Diet recommendation: G-tube ? ?Subjective: Seen and examined.  Patient nonverbal.  Appears comfortable and stable at his baseline. ? ?Brief/Interim Summary: This is a 81 year old gentleman with multiple medical problems who presented from [facility] with concern of possible seizure.  Seen by neurology, Keppra increased from 750 mg p.o. twice daily to 1000 mg p.o. twice daily.  Admitted under hospitalist service only for observation for 24 hours.  There was some concern about left lower lobe pneumonia on the chest x-ray  however patient having no pneumonia symptoms such as shortness of breath, fever, tachypnea and low procalcitonin, he does not appear to have pneumonia clinically and based on the antibiotic stewardship, antibiotics were not started which I totally agree with.  Patient underwent repeat EEG which did not show any seizure or epileptiform discharges.  Patient remained seizure-free for 24 hours.  There was some concern about possible UTI for which he was started on Rocephin.  Urine culture is growing multiple organisms/contamination.  It is possible that UTI may have caused seizure as well.  He will be discharged on antibiotics to cover UTI and will be discharged on increased dose of Keppra now that he has been seizure-free for 24 hours.  Discussed with his daughter over the phone. ? ?Discharge Diagnoses:  ?Principal Problem: ?  Seizure ([facility]) ?Active Problems: ?  Pneumonia ?  Impaired quality of life ?  Protein-calorie malnutrition, severe ?  Chronic obstructive pulmonary disease (HCC) ?  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  Atrial fibrillation ([place]) ?  HLD (hyperlipidemia) ?  Parkinsonism ([facility]) ?  UTI (urinary tract infection) ? ? ? ?Discharge Instructions ? ? ?Allergies as of 05/31/2021   ? ?   Reactions  ? Prednisone Other (See Comments)  ? Makes skin crawl, rapid HR  ? Cortisone Palpitations  ? ?  ? ?  ?Medication List  ?  ? ?STOP  taking these medications   ? ?ceFEPime  IVPB ?Commonly known as: MAXIPIME ?  ?clonazePAM 0.5 MG tablet ?Commonly known as: KLONOPIN ?  ?ipratropium-albuterol 0.5-2.5 (3) MG/3ML Soln ?Commonly known as: DUONEB ?  ?levETIRAcetam 100 MG/ML solution ?Commonly known as: Keppra ?Replaced by: levETIRAcetam 1000 MG tablet ?  ?PROSTAT PO ?  ? ?  ? ?TAKE these medications   ? ?acetaminophen 325 MG tablet ?Commonly known as: TYLENOL ?Place 650 mg into feeding tube every 8 (eight) hours as needed for moderate pain. ?  ?ALPRAZolam 0.5 MG tablet ?Commonly known as:  Duanne Moron ?Place 1 tablet (0.5 mg total) into feeding tube every 8 (eight) hours as needed for anxiety. ?  ?CULTURELLE PROBIOTICS PO ?Give 1 capsule by tube daily. 10 billion cell ?  ?escitalopram 10 MG tablet ?Commonly known as: LEXAPRO ?Place 10 mg into feeding tube daily. ?  ?esomeprazole 40 MG packet ?Commonly known as: Parole ?40 mg daily. Per G tube ?  ?feeding supplement (JEVITY 1.5 CAL/FIBER) Liqd ?Place 1,000 mLs into feeding tube continuous. ?What changed: Another medication with the same name was removed. Continue taking this medication, and follow the directions you see here. ?  ?fexofenadine 180 MG tablet ?Commonly known as: ALLEGRA ?Place 180 mg into feeding tube daily. ?  ?fluticasone 50 MCG/ACT nasal spray ?Commonly known as: FLONASE ?Place 2 sprays into both nostrils in the morning and at bedtime. ?  ?folic acid 1 MG tablet ?Commonly known as: FOLVITE ?Take 1 tablet (1 mg total) by mouth daily. ?What changed: how to take this ?  ?furosemide 20 MG tablet ?Commonly known as: LASIX ?Place 1 tablet (20 mg total) into feeding tube daily as needed for fluid or edema. ?  ?guaiFENesin 100 MG/5ML liquid ?Commonly known as: ROBITUSSIN ?Place 400 mg into feeding tube 3 (three) times daily. ?  ?hydrOXYzine 25 MG tablet ?Commonly known as: ATARAX ?Place 25 mg into feeding tube every 6 (six) hours as needed for anxiety. ?  ?lactulose 10 GM/15ML solution ?Commonly known as: [place] ?Place 20 g into feeding tube 2 (two) times daily as needed for mild constipation. ?  ?levalbuterol 1.25 MG/3ML nebulizer solution ?Commonly known as: XOPENEX ?Take 1.25 mg by nebulization in the morning and at bedtime. ?  ?levalbuterol 45 MCG/ACT inhaler ?Commonly known as: XOPENEX HFA ?Inhale 1 puff into the lungs in the morning and at bedtime. ?  ?levETIRAcetam 1000 MG tablet ?Commonly known as: KEPPRA ?Place 1 tablet (1,000 mg total) into feeding tube 2 (two) times daily. ?Replaces: levETIRAcetam 100 MG/ML solution ?  ?levothyroxine  25 MCG tablet ?Commonly known as: SYNTHROID ?Place 25 mcg into feeding tube daily before breakfast. ?  ?midodrine 10 MG tablet ?Commonly known as: PROAMATINE ?Place 1 tablet (10 mg total) into feeding tube 3 (three) times daily with meals. ?  ?multivitamin with minerals Tabs tablet ?Place 1 tablet into feeding tube daily. ?  ?ondansetron 4 MG tablet ?Commonly known as: ZOFRAN ?Take 1 tablet (4 mg total) by mouth every 6 (six) hours as needed for nausea. ?  ?polyethylene glycol powder 17 GM/SCOOP powder ?Commonly known as: GLYCOLAX/MIRALAX ?Place 17 g into feeding tube every other day. ?  ?propranolol 10 MG tablet ?Commonly known as: INDERAL ?Place 1 tablet (10 mg total) into feeding tube daily as needed (for tremors in AM. do not administer if heart rate is below 60 or systolic blood pressure is below 90.). ?What changed:  ?when to take this ?additional instructions ?  ?sodium chloride 0.65 % Soln nasal spray ?  Commonly known as: OCEAN ?Place 1 spray into both nostrils every 4 (four) hours as needed for congestion. ?  ?sulfamethoxazole-trimethoprim 800-160 MG tablet ?Commonly known as: BACTRIM DS ?1 tablet by Per J Tube route 2 (two) times daily for 4 days. ?  ?Super Omega 3 EPA/DHA 1000 MG Caps ?Give 1,000 mg by tube daily. ?  ?tamsulosin 0.4 MG Caps capsule ?Commonly known as: FLOMAX ?Take 0.4 mg by mouth at bedtime. ?  ?vitamin C 500 MG tablet ?Commonly known as: ASCORBIC ACID ?Place 500 mg into feeding tube daily. ?  ?Vitamin D3 Super Strength 50 MCG (2000 UT) Tabs ?Generic drug: Cholecalciferol ?Give 2,000 Units by tube daily. ?  ?zinc gluconate 50 MG tablet ?Place 50 mg into feeding tube 2 (two) times daily. ?  ? ?  ? ? Follow-up Information   ? ? Shackleford, Vicie Mutters, MD Follow up in 1 week(s).   ?Specialty: Internal Medicine ?Contact information: ?[address] ?[place] [address] ?[phone] ? ? ?  ?  ? ? Croitoru, Dani Gobble, MD .   ?Specialty: Cardiology ?Contact information: ?[address] ?[address] ?[place] [address] ?[phone] ? ? ?  ?  ? ?  ?  ? ?  ? ?Allergies  ?Allergen Reactions  ? Prednisone Other (See Comments)  ?  Makes skin crawl, rapid HR  ? Cortisone Palpitations  ? ? ?Consultati

## 2021-05-31 NOTE — Care Management Obs Status (Signed)
MEDICARE OBSERVATION STATUS NOTIFICATION ? ? ?Patient Details  ?Name: [facility] ?MRN: [id number] ?Date of Birth: 11-12-1940 ? ? ?Medicare Observation Status Notification Given:  Yes ? ? ? ?Geralynn Ochs, LCSW ?05/31/2021, 10:46 AM ?

## 2021-05-31 NOTE — Care Management (Signed)
Patient medically stable for return to [facility], as per CSW arrangements.  [facility] notified for transport; dispatch states may be "a while".  Updated bedside nurse.   ? ?Reinaldo Raddle, RN, BSN  ?Trauma/Neuro ICU Case Manager ?[phone]  ?

## 2021-05-31 NOTE — TOC Transition Note (Signed)
Transition of Care (TOC) - CM/SW Discharge Note ? ? ?Patient Details  ?Name: TANIELA FELTUS ?MRN: [id number] ?Date of Birth: 21-Jun-1940 ? ?Transition of Care [facility]) CM/SW Contact:  ?Geralynn Ochs, LCSW ?Phone Number: ?05/31/2021, 1:55 PM ? ? ?Clinical Narrative:   CSW updated by MD that patient is stable to return to [facility]. CSW confirmed patient is from [facility], confirmed with daughter [name] that plan was to return. CSW sent discharge information and confirmed that [facility] was ready to accept patient back. Accepting physician is Dr. Sheppard Coil, transport to be set up with [facility].  ? ?Nurse to call report to [phone], Room 303B. ? ? ? ?Final next level of care: [facility] ?Barriers to Discharge: Barriers Resolved ? ? ?Patient Goals and CMS Choice ?Patient states their goals for this hospitalization and ongoing recovery are:: patient unable to participate in goal setting, intubated ?CMS Medicare.gov Compare Post Acute Care list provided to:: Patient Represenative (must comment) ?Choice offered to / list presented to : Adult Children ? ?Discharge Placement ?  ?           ?Patient chooses bed at: [facility] ?Patient to be transferred to facility by: [facility] ?Name of family member notified: [name] ?Patient and family notified of of transfer: 05/31/21 ? ?Discharge Plan and Services ?  ?  ?           ?  ?  ?  ?  ?  ?  ?  ?  ?  ?  ? ?Social Determinants of Health (SDOH) Interventions ?  ? ? ?Readmission Risk Interventions ?   ? View : No data to display.  ?  ?  ?  ? ? ? ? ? ?

## 2021-05-31 NOTE — Progress Notes (Signed)
Report given to [facility] Loss adjuster, chartered.  VSS, see chart.   ?

## 2021-06-03 DIAGNOSIS — J9620 Acute and chronic respiratory failure, unspecified whether with hypoxia or hypercapnia: Secondary | ICD-10-CM | POA: Diagnosis not present

## 2021-06-04 ENCOUNTER — Emergency Department (HOSPITAL_COMMUNITY): Payer: Medicare Other

## 2021-06-04 ENCOUNTER — Other Ambulatory Visit: Payer: Self-pay

## 2021-06-04 ENCOUNTER — Inpatient Hospital Stay (HOSPITAL_COMMUNITY)
Admission: EM | Admit: 2021-06-04 | Discharge: 2021-06-08 | DRG: 698 | Disposition: A | Discharge status: Other Acute Inpatient Hospital | Payer: Medicare Other | Source: Skilled Nursing Facility | Attending: Internal Medicine | Admitting: Internal Medicine

## 2021-06-04 DIAGNOSIS — G40909 Epilepsy, unspecified, not intractable, without status epilepticus: Secondary | ICD-10-CM | POA: Diagnosis not present

## 2021-06-04 DIAGNOSIS — E43 Unspecified severe protein-calorie malnutrition: Secondary | ICD-10-CM | POA: Diagnosis present

## 2021-06-04 DIAGNOSIS — L89151 Pressure ulcer of sacral region, stage 1: Secondary | ICD-10-CM | POA: Diagnosis present

## 2021-06-04 DIAGNOSIS — I959 Hypotension, unspecified: Secondary | ICD-10-CM | POA: Diagnosis present

## 2021-06-04 DIAGNOSIS — J439 Emphysema, unspecified: Secondary | ICD-10-CM | POA: Diagnosis not present

## 2021-06-04 DIAGNOSIS — G20 Parkinson's disease: Secondary | ICD-10-CM | POA: Diagnosis present

## 2021-06-04 DIAGNOSIS — R4182 Altered mental status, unspecified: Secondary | ICD-10-CM

## 2021-06-04 DIAGNOSIS — Z8249 Family history of ischemic heart disease and other diseases of the circulatory system: Secondary | ICD-10-CM

## 2021-06-04 DIAGNOSIS — Z681 Body mass index (BMI) 19 or less, adult: Secondary | ICD-10-CM | POA: Diagnosis not present

## 2021-06-04 DIAGNOSIS — G9341 Metabolic encephalopathy: Secondary | ICD-10-CM | POA: Diagnosis present

## 2021-06-04 DIAGNOSIS — J9620 Acute and chronic respiratory failure, unspecified whether with hypoxia or hypercapnia: Secondary | ICD-10-CM | POA: Diagnosis not present

## 2021-06-04 DIAGNOSIS — T83511A Infection and inflammatory reaction due to indwelling urethral catheter, initial encounter: Principal | ICD-10-CM | POA: Diagnosis present

## 2021-06-04 DIAGNOSIS — E039 Hypothyroidism, unspecified: Secondary | ICD-10-CM | POA: Diagnosis present

## 2021-06-04 DIAGNOSIS — J9621 Acute and chronic respiratory failure with hypoxia: Secondary | ICD-10-CM | POA: Diagnosis not present

## 2021-06-04 DIAGNOSIS — R569 Unspecified convulsions: Secondary | ICD-10-CM | POA: Diagnosis not present

## 2021-06-04 DIAGNOSIS — Z9911 Dependence on respirator [ventilator] status: Secondary | ICD-10-CM | POA: Diagnosis not present

## 2021-06-04 DIAGNOSIS — F419 Anxiety disorder, unspecified: Secondary | ICD-10-CM | POA: Diagnosis present

## 2021-06-04 DIAGNOSIS — R64 Cachexia: Secondary | ICD-10-CM | POA: Diagnosis present

## 2021-06-04 DIAGNOSIS — R0989 Other specified symptoms and signs involving the circulatory and respiratory systems: Secondary | ICD-10-CM | POA: Diagnosis not present

## 2021-06-04 DIAGNOSIS — I5042 Chronic combined systolic (congestive) and diastolic (congestive) heart failure: Secondary | ICD-10-CM | POA: Diagnosis present

## 2021-06-04 DIAGNOSIS — Z95828 Presence of other vascular implants and grafts: Secondary | ICD-10-CM

## 2021-06-04 DIAGNOSIS — G9340 Encephalopathy, unspecified: Principal | ICD-10-CM

## 2021-06-04 DIAGNOSIS — R0902 Hypoxemia: Secondary | ICD-10-CM | POA: Diagnosis not present

## 2021-06-04 DIAGNOSIS — J449 Chronic obstructive pulmonary disease, unspecified: Secondary | ICD-10-CM | POA: Diagnosis present

## 2021-06-04 DIAGNOSIS — Z86718 Personal history of other venous thrombosis and embolism: Secondary | ICD-10-CM | POA: Diagnosis not present

## 2021-06-04 DIAGNOSIS — F32A Depression, unspecified: Secondary | ICD-10-CM | POA: Diagnosis present

## 2021-06-04 DIAGNOSIS — J9611 Chronic respiratory failure with hypoxia: Secondary | ICD-10-CM | POA: Diagnosis present

## 2021-06-04 DIAGNOSIS — I468 Cardiac arrest due to other underlying condition: Secondary | ICD-10-CM | POA: Diagnosis not present

## 2021-06-04 DIAGNOSIS — I480 Paroxysmal atrial fibrillation: Secondary | ICD-10-CM | POA: Diagnosis not present

## 2021-06-04 DIAGNOSIS — Z888 Allergy status to other drugs, medicaments and biological substances status: Secondary | ICD-10-CM

## 2021-06-04 DIAGNOSIS — Y846 Urinary catheterization as the cause of abnormal reaction of the patient, or of later complication, without mention of misadventure at the time of the procedure: Secondary | ICD-10-CM | POA: Diagnosis present

## 2021-06-04 DIAGNOSIS — J9503 Malfunction of tracheostomy stoma: Secondary | ICD-10-CM | POA: Diagnosis not present

## 2021-06-04 DIAGNOSIS — F418 Other specified anxiety disorders: Secondary | ICD-10-CM | POA: Diagnosis present

## 2021-06-04 DIAGNOSIS — J989 Respiratory disorder, unspecified: Secondary | ICD-10-CM | POA: Diagnosis not present

## 2021-06-04 DIAGNOSIS — R54 Age-related physical debility: Secondary | ICD-10-CM | POA: Diagnosis present

## 2021-06-04 DIAGNOSIS — Z7401 Bed confinement status: Secondary | ICD-10-CM

## 2021-06-04 DIAGNOSIS — L8990 Pressure ulcer of unspecified site, unspecified stage: Secondary | ICD-10-CM

## 2021-06-04 DIAGNOSIS — Z431 Encounter for attention to gastrostomy: Secondary | ICD-10-CM | POA: Diagnosis not present

## 2021-06-04 DIAGNOSIS — Z930 Tracheostomy status: Secondary | ICD-10-CM | POA: Diagnosis not present

## 2021-06-04 DIAGNOSIS — I9589 Other hypotension: Secondary | ICD-10-CM | POA: Diagnosis present

## 2021-06-04 DIAGNOSIS — Z66 Do not resuscitate: Secondary | ICD-10-CM | POA: Diagnosis not present

## 2021-06-04 DIAGNOSIS — Z931 Gastrostomy status: Secondary | ICD-10-CM

## 2021-06-04 DIAGNOSIS — Z430 Encounter for attention to tracheostomy: Secondary | ICD-10-CM | POA: Diagnosis not present

## 2021-06-04 DIAGNOSIS — I469 Cardiac arrest, cause unspecified: Secondary | ICD-10-CM | POA: Diagnosis not present

## 2021-06-04 DIAGNOSIS — I2510 Atherosclerotic heart disease of native coronary artery without angina pectoris: Secondary | ICD-10-CM | POA: Diagnosis present

## 2021-06-04 DIAGNOSIS — N390 Urinary tract infection, site not specified: Secondary | ICD-10-CM | POA: Diagnosis not present

## 2021-06-04 DIAGNOSIS — R9431 Abnormal electrocardiogram [ECG] [EKG]: Secondary | ICD-10-CM | POA: Diagnosis not present

## 2021-06-04 DIAGNOSIS — Z79899 Other long term (current) drug therapy: Secondary | ICD-10-CM

## 2021-06-04 DIAGNOSIS — Z79890 Hormone replacement therapy (postmenopausal): Secondary | ICD-10-CM

## 2021-06-04 LAB — I-STAT ARTERIAL BLOOD GAS, ED
Acid-Base Excess: 2 mmol/L (ref 0.0–2.0)
Bicarbonate: 26.3 mmol/L (ref 20.0–28.0)
Calcium, Ion: 1.27 mmol/L (ref 1.15–1.40)
HCT: 31 % — ABNORMAL LOW (ref 39.0–52.0)
Hemoglobin: 10.5 g/dL — ABNORMAL LOW (ref 13.0–17.0)
O2 Saturation: 100 %
Potassium: 4.9 mmol/L (ref 3.5–5.1)
Sodium: 135 mmol/L (ref 135–145)
TCO2: 27 mmol/L (ref 22–32)
pCO2 arterial: 40.1 mmHg (ref 32–48)
pH, Arterial: 7.424 (ref 7.35–7.45)
pO2, Arterial: 382 mmHg — ABNORMAL HIGH (ref 83–108)

## 2021-06-04 LAB — I-STAT CHEM 8, ED
BUN: 14 mg/dL (ref 8–23)
Calcium, Ion: 1.18 mmol/L (ref 1.15–1.40)
Chloride: 103 mmol/L (ref 98–111)
Creatinine, Ser: 0.6 mg/dL — ABNORMAL LOW (ref 0.61–1.24)
Glucose, Bld: 128 mg/dL — ABNORMAL HIGH (ref 70–99)
HCT: 31 % — ABNORMAL LOW (ref 39.0–52.0)
Hemoglobin: 10.5 g/dL — ABNORMAL LOW (ref 13.0–17.0)
Potassium: 4.8 mmol/L (ref 3.5–5.1)
Sodium: 135 mmol/L (ref 135–145)
TCO2: 24 mmol/L (ref 22–32)

## 2021-06-04 LAB — URINALYSIS, ROUTINE W REFLEX MICROSCOPIC
Bilirubin Urine: NEGATIVE
Glucose, UA: NEGATIVE mg/dL
Hgb urine dipstick: NEGATIVE
Ketones, ur: NEGATIVE mg/dL
Nitrite: NEGATIVE
Protein, ur: 30 mg/dL — AB
Specific Gravity, Urine: 1.019 (ref 1.005–1.030)
WBC, UA: >50 WBC/hpf — ABNORMAL HIGH (ref 0–5)
pH: 6 (ref 5.0–8.0)

## 2021-06-04 LAB — CBC WITH DIFFERENTIAL/PLATELET
Abs Immature Granulocytes: 0.09 10*3/uL — ABNORMAL HIGH (ref 0.00–0.07)
Basophils Absolute: 0 10*3/uL (ref 0.0–0.1)
Basophils Relative: 0 %
Eosinophils Absolute: 0 10*3/uL (ref 0.0–0.5)
Eosinophils Relative: 0 %
HCT: 31.3 % — ABNORMAL LOW (ref 39.0–52.0)
Hemoglobin: 10.4 g/dL — ABNORMAL LOW (ref 13.0–17.0)
Immature Granulocytes: 1 %
Lymphocytes Relative: 2 %
Lymphs Abs: 0.4 10*3/uL — ABNORMAL LOW (ref 0.7–4.0)
MCH: 29.4 pg (ref 26.0–34.0)
MCHC: 33.2 g/dL (ref 30.0–36.0)
MCV: 88.4 fL (ref 80.0–100.0)
Monocytes Absolute: 1.2 10*3/uL — ABNORMAL HIGH (ref 0.1–1.0)
Monocytes Relative: 8 %
Neutro Abs: 13.8 10*3/uL — ABNORMAL HIGH (ref 1.7–7.7)
Neutrophils Relative %: 89 %
Platelets: 246 10*3/uL (ref 150–400)
RBC: 3.54 MIL/uL — ABNORMAL LOW (ref 4.22–5.81)
RDW: 16.7 % — ABNORMAL HIGH (ref 11.5–15.5)
WBC: 15.5 10*3/uL — ABNORMAL HIGH (ref 4.0–10.5)
nRBC: 0 % (ref 0.0–0.2)

## 2021-06-04 LAB — AMMONIA: Ammonia: 34 umol/L (ref 9–35)

## 2021-06-04 LAB — LACTIC ACID, PLASMA: Lactic Acid, Venous: 1.4 mmol/L (ref 0.5–1.9)

## 2021-06-04 LAB — I-STAT VENOUS BLOOD GAS, ED
Acid-Base Excess: 1 mmol/L (ref 0.0–2.0)
Bicarbonate: 25.4 mmol/L (ref 20.0–28.0)
Calcium, Ion: 1.2 mmol/L (ref 1.15–1.40)
HCT: 31 % — ABNORMAL LOW (ref 39.0–52.0)
Hemoglobin: 10.5 g/dL — ABNORMAL LOW (ref 13.0–17.0)
O2 Saturation: 99 %
Potassium: 4.8 mmol/L (ref 3.5–5.1)
Sodium: 135 mmol/L (ref 135–145)
TCO2: 27 mmol/L (ref 22–32)
pCO2, Ven: 40.2 mmHg — ABNORMAL LOW (ref 44–60)
pH, Ven: 7.408 (ref 7.25–7.43)
pO2, Ven: 142 mmHg — ABNORMAL HIGH (ref 32–45)

## 2021-06-04 LAB — COMPREHENSIVE METABOLIC PANEL
ALT: 31 U/L (ref 0–44)
AST: 19 U/L (ref 15–41)
Albumin: 3 g/dL — ABNORMAL LOW (ref 3.5–5.0)
Alkaline Phosphatase: 100 U/L (ref 38–126)
Anion gap: 8 (ref 5–15)
BUN: 14 mg/dL (ref 8–23)
CO2: 22 mmol/L (ref 22–32)
Calcium: 9.2 mg/dL (ref 8.9–10.3)
Chloride: 105 mmol/L (ref 98–111)
Creatinine, Ser: 0.69 mg/dL (ref 0.61–1.24)
GFR, Estimated: >60 mL/min (ref >60)
Glucose, Bld: 130 mg/dL — ABNORMAL HIGH (ref 70–99)
Potassium: 5 mmol/L (ref 3.5–5.1)
Sodium: 135 mmol/L (ref 135–145)
Total Bilirubin: 0.4 mg/dL (ref 0.3–1.2)
Total Protein: 6.6 g/dL (ref 6.5–8.1)

## 2021-06-04 LAB — CBG MONITORING, ED: Glucose-Capillary: 150 mg/dL — ABNORMAL HIGH (ref 70–99)

## 2021-06-04 IMAGING — DX DG CHEST 1V PORT
1 series · 1 of 1 positions shown · non-contrast
Comparison: Chest radiograph dated [DATE].

CLINICAL DATA: Altered mental status.

EXAM:
PORTABLE CHEST 1 VIEW

[chest ap]
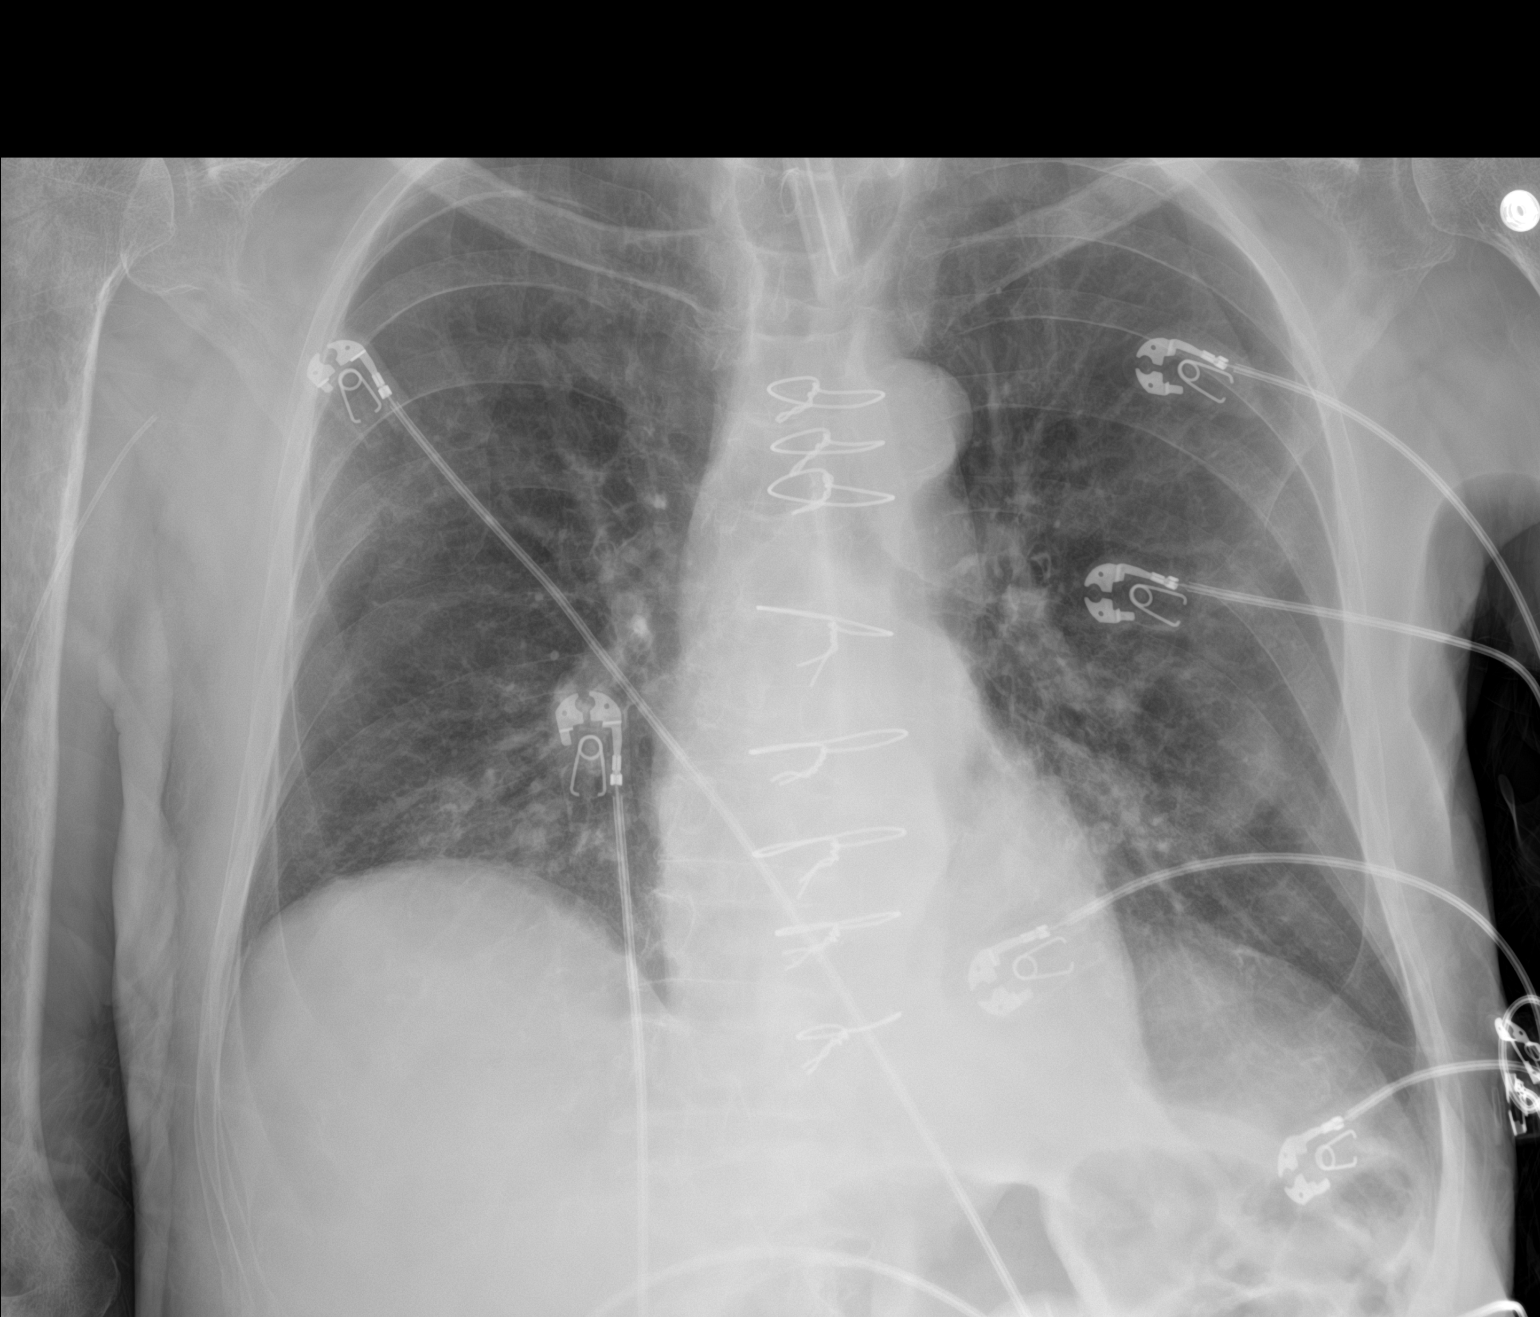

[1 of 1 positions shown; findings below may reference images not displayed]

FINDINGS: Tracheostomy above the carina. There is diffuse chronic intra
coarsening. Bibasilar densities may represent atelectasis.
Developing infiltrate is not excluded. No pleural effusion or
pneumothorax. The cardiac silhouette is within limits. Median
sternotomy wires. No acute osseous pathology.
IMPRESSION: Bibasilar atelectasis versus less likely developing infiltrate.

## 2021-06-04 MED ORDER — LEVETIRACETAM IN NACL 1000 MG/100ML IV SOLN
1000.0000 mg | Freq: Once | INTRAVENOUS | Status: AC
  Administered 2021-06-04: 1000 mg via INTRAVENOUS
  Filled 2021-06-04: qty 100

## 2021-06-04 MED ORDER — SODIUM CHLORIDE 0.9 % IV BOLUS
1000.0000 mL | Freq: Once | INTRAVENOUS | Status: AC
  Administered 2021-06-04: 1000 mL via INTRAVENOUS

## 2021-06-04 MED ORDER — SODIUM CHLORIDE 0.9 % IV SOLN: INTRAVENOUS | Status: DC

## 2021-06-04 MED ORDER — LINEZOLID 600 MG/300ML IV SOLN
600.0000 mg | Freq: Two times a day (BID) | INTRAVENOUS | Status: DC
  Administered 2021-06-04 – 2021-06-06 (×4): 600 mg via INTRAVENOUS
  Filled 2021-06-04 (×4): qty 300

## 2021-06-04 MED ORDER — SODIUM CHLORIDE 0.9 % IV BOLUS
500.0000 mL | Freq: Once | INTRAVENOUS | Status: DC
Start: 2021-06-04 — End: 2021-06-04

## 2021-06-04 NOTE — Progress Notes (Signed)
EEG complete - results pending 

## 2021-06-04 NOTE — ED Notes (Signed)
Pt transported to  and from CT w/ RN and RT. No complications. ?

## 2021-06-04 NOTE — Progress Notes (Signed)
Pt transported form [facility] to CT and back with no complications.   ?

## 2021-06-04 NOTE — ED Provider Notes (Signed)
?[facility] ?Provider Note ? ? ?CSN: [id number] ?Arrival date & time: 06/04/21  1922 ? ?  ? ?History ? ?Chief Complaint  ?Patient presents with  ? Seizures  ? ? ?[facility] is a 81 y.o. male. ? ?HPI ? 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  The patient was reportedly having focal seizure around 1700.  No medications were given at the time.  There is some concern for change in the patient's mental status.  The patient was reportedly unresponsive for a period of time per EMS.  He arrived to the emergency department initially GCS 7, subsequently improved to GCS 8.  He arrives with a DNR in place.  No seizure activity reported by EMS noted.  No seizure activity on arrival. ? ?Home Medications ?Prior to Admission medications   ?Medication Sig Start Date End Date Taking? Authorizing Provider  ?acetaminophen (TYLENOL) 650 MG CR tablet 650 mg See admin instructions. 650 mg, per tube, every 8 hours as needed for moderate pain   Yes [provider]  ?ALPRAZolam (XANAX) 0.5 MG tablet Place 1 tablet (0.5 mg total) into feeding tube every 8 (eight) hours as needed for anxiety. 05/31/21  Yes Pahwani, Einar Grad, MD  ?escitalopram (LEXAPRO) 10 MG tablet Place 10 mg into feeding tube daily.   Yes [provider]  ?esomeprazole (NEXIUM) 40 MG packet 40 mg See admin instructions. 40 mg, per tube, every morning   Yes [provider]  ?fexofenadine (ALLEGRA) 180 MG tablet Place 180 mg into feeding tube daily.   Yes [provider]  ?fluticasone (FLONASE) 50 MCG/ACT nasal spray Place 2 sprays into both nostrils in the morning and at bedtime.   Yes [provider]  ?folic acid (FOLVITE) 1 MG  tablet Take 1 tablet (1 mg total) by mouth daily. ?Patient taking differently: Place 1 mg into feeding tube daily. 09/29/20  Yes [name], MD  ?furosemide (LASIX) 20 MG tablet Place 1 tablet (20 mg total) into feeding tube daily as needed for fluid or edema. 04/16/21  Yes Vann, Jessica U, DO  ?GERI-TUSSIN 100 MG/5ML liquid Place 400 mg into feeding tube 3 (three) times daily.   Yes [provider]  ?hydrOXYzine (ATARAX) 25 MG tablet Place 25 mg into feeding tube every 6 (six) hours as needed for anxiety.   Yes [provider]  ?lactulose (CHRONULAC) 10 GM/15ML solution Place 20 g into feeding tube 2 (two) times daily as needed for mild constipation.   Yes [provider]  ?levalbuterol (XOPENEX) 1.25 MG/3ML nebulizer solution Take 1.25 mg by nebulization every 6 (six) hours.   Yes [provider]  ?levETIRAcetam (KEPPRA) 100 MG/ML solution Place 1,000 mg into feeding tube in the morning and at bedtime.   Yes [provider]  ?levothyroxine (SYNTHROID) 25 MCG tablet Place 25 mcg into feeding tube daily before breakfast.   Yes [provider]  ?midodrine (PROAMATINE) 10 MG tablet Place 1 tablet (10 mg total) into feeding tube 3 (three) times daily with meals. 05/01/21  Yes Thurnell Lose, MD  ?Multiple Vitamin (MULTIVITAMIN WITH MINERALS) TABS tablet Place 1 tablet into feeding tube daily. 05/11/21  Yes Raiford Noble [place], DO  ?Nutritional Supplements (FEEDING SUPPLEMENT, JEVITY 1.5 CAL/FIBER,) LIQD Place 1,000 mLs into  feeding tube continuous. ?Patient taking differently: Place 55 mL/hr into feeding tube continuous. 05/10/21  Yes Raiford Noble Wade, DO  ?Nutritional Supplements (FEEDING SUPPLEMENT, JEVITY 1.5 CAL/FIBER,) LIQD Place 55 mL/hr into feeding tube continuous.   Yes [provider]  ?Omega-3 Fatty Acids (SUPER OMEGA 3 EPA/DHA) 1000 MG CAPS Give 1,000 mg by tube daily.   Yes [provider]  ?ondansetron (ZOFRAN-ODT) 4 MG  disintegrating tablet 4 mg See admin instructions. 4 mg, per tube, every six hours as needed for nausea with vomiting   Yes [provider]  ?polyethylene glycol powder (GLYCOLAX/MIRALAX) 17 GM/SCOOP powder Place 17 g into feeding tube See admin instructions. 17 grams, per tube, every 2 days   Yes [provider]  ?Probiotic Product (CULTURELLE PROBIOTICS PO) Give 1 capsule by tube daily. 10 billion cell   Yes [provider]  ?propranolol (INDERAL) 10 MG tablet Place 1 tablet (10 mg total) into feeding tube daily as needed (for tremors in AM. do not administer if heart rate is below 60 or systolic blood pressure is below 90.). ?Patient taking differently: Place 10 mg into feeding tube See admin instructions. 10 mg, per tube, in the morning as needed for tremors and do not administer if heart rate is less than 60 or Systolic B/P is less than 90 05/01/21  Yes Thurnell Lose, MD  ?sodium chloride (OCEAN) 0.65 % SOLN nasal spray Place 1 spray into both nostrils every 4 (four) hours as needed (for allergic rhinitis).   Yes [provider]  ?tamsulosin (FLOMAX) 0.4 MG CAPS capsule 0.4 mg See admin instructions. 0.4 mg per tube at bedtime   Yes [provider]  ?vitamin C (ASCORBIC ACID) 500 MG tablet Place 500 mg into feeding tube daily.   Yes [provider]  ?zinc gluconate 50 MG tablet Place 50 mg into feeding tube 2 (two) times daily.   Yes [provider]  ?ondansetron (ZOFRAN) 4 MG tablet Take 1 tablet (4 mg total) by mouth every 6 (six) hours as needed for nausea. ?Patient not taking: Reported on 06/04/2021 05/10/21   Kerney Elbe, DO  ?   ? ?Allergies    ?Prednisone and Cortisone   ? ?Review of Systems   ?Review of Systems  ?Unable to perform ROS: Mental status change  ? ?Physical Exam ?Updated Vital Signs ?BP (!) 95/57   Pulse (!) 58   Temp 98.2 ?F (36.8 ?C) (Temporal)   Resp (!) 24   SpO2 100%  ?Physical Exam ?Vitals and nursing note  reviewed.  ?Constitutional:   ?   General: He is not in acute distress. ?HENT:  ?   Head: Normocephalic and atraumatic.  ?Eyes:  ?   Conjunctiva/sclera: Conjunctivae normal.  ?   Pupils: Pupils are equal, round, and reactive to light.  ?Neck:  ?   Comments: Tracheostomy in place, connected to the vent ?Cardiovascular:  ?   Rate and Rhythm: Normal rate and regular rhythm.  ?Pulmonary:  ?   Effort: Pulmonary effort is normal. No respiratory distress.  ?Abdominal:  ?   General: There is no distension.  ?   Tenderness: There is no guarding.  ?Genitourinary: ?   Comments: Foley catheter in place ?Musculoskeletal:     ?   General: No deformity or signs of injury.  ?   Cervical back: Neck supple.  ?Skin: ?   Findings: No lesion or rash.  ?Neurological:  ?   Mental Status: He is alert.  ?  Comments: GCS 8 (2-1-5), patient nonverbal at baseline, withdraws to painful stimuli in all 4 extremities  ? ? ?ED Results / Procedures / Treatments   ?Labs ?(all labs ordered are listed, but only abnormal results are displayed) ?Labs Reviewed  ?COMPREHENSIVE METABOLIC PANEL - Abnormal; Notable for the following components:  ?    Result Value  ? Glucose, Bld 130 (*)   ? Albumin 3.0 (*)   ? All other components within normal limits  ?CBC WITH DIFFERENTIAL/PLATELET - Abnormal; Notable for the following components:  ? WBC 15.5 (*)   ? RBC 3.54 (*)   ? Hemoglobin 10.4 (*)   ? HCT 31.3 (*)   ? RDW 16.7 (*)   ? Neutro Abs 13.8 (*)   ? Lymphs Abs 0.4 (*)   ? Monocytes Absolute 1.2 (*)   ? Abs Immature Granulocytes 0.09 (*)   ? All other components within normal limits  ?URINALYSIS, ROUTINE W REFLEX MICROSCOPIC - Abnormal; Notable for the following components:  ? Color, Urine AMBER (*)   ? APPearance CLOUDY (*)   ? Protein, ur 30 (*)   ? Leukocytes,Ua LARGE (*)   ? WBC, UA >50 (*)   ? Bacteria, UA MANY (*)   ? All other components within normal limits  ?CBG MONITORING, ED - Abnormal; Notable for the following components:  ? Glucose-Capillary 150  (*)   ? All other components within normal limits  ?I-STAT CHEM 8, ED - Abnormal; Notable for the following components:  ? Creatinine, Ser 0.60 (*)   ? Glucose, Bld 128 (*)   ? Hemoglobin 10.5 (*)   ? HCT 31.0 Marland Kitchen

## 2021-06-04 NOTE — H&P (Signed)
Initial vitals showed BP ?History and Physical  ? ? ?Austin Davis HQI:[id number] DOB: 1940-01-28 DOA: 06/04/2021 ? ?PCP: Aaron Edelman, MD  ?Patient coming from: [facility] ? ?I have personally briefly reviewed patient's old medical records in [place] ? ?Chief Complaint: Encephalopathy ? ?HPI: ?Austin Davis is a 81 y.o. 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  Patient is unable to provide history as he cannot speak over the vent and is otherwise supplemented by EDP, chart review, and daughter by phone. ? ?Patient recently admitted 05/29/2021-05/31/2021 for seizure-like activity.  Keppra was increased from 750 mg twice daily to 1000 mg twice daily.  He was also suspected to have UTI.  Urine culture grew multiple species.  He was treated with empiric Bactrim. ? ?Per ED documentation, patient was transferred from [facility] for seizure-like activity.  He was reportedly having focal seizures around 1700 on 5/16.  No medications were reportedly given.  [facility] were called and on their arrival they did not see any seizure activity. ? ?On admitting exam, patient is somnolent but awakens easily.  He is following commands and moving all extremities equally.  He is nonverbal on vent. ? ?ED Course  Labs/Imaging on admission: I have personally reviewed following labs and imaging studies. ? ?Initial vitals showed BP 102/58, pulse 70, RR 20, temp 98.2 ?F, SPO2 100% on ventilator at 70% FiO2. ? ?Labs showed sodium 135, potassium 5.0, bicarb 22, BUN 14, creatinine 0.69, serum glucose 130,  LFTs within normal limits, WBC 15.5, hemoglobin 10.4, platelets 246,000, lactic acid 1.4, ammonia 34. ? ?Urinalysis shows negative nitrates, large leukocytes, 11-20 RBC/hpf, >50 WBC/hpf, many bacteria microscopy.  Urine and blood cultures in process.  Levetiracetam level in process. ? ?Portable chest x-ray shows bibasilar atelectasis. ? ?CT head without contrast negative for acute intracranial abnormality.  Stable mild cerebral atrophy noted. ? ?EDP discussed with on-call neurology who recommended obtaining EEG otherwise felt presentation most likely encephalopathy.  EEG completed and pending read.  Patient was given 1 L normal saline, IV Keppra 1000 mg, and started on IV linezolid due to history of VRE UTI.  The hospitalist service was consulted to admit for further evaluation and management. ? ?Review of Systems: All systems reviewed and are negative except as documented in history of present illness above. ? ? ?Past Medical History:  ?Diagnosis Date  ? CHF (congestive heart failure) ([place])   ? Coronary artery disease   ? Essential tremor   ? GI bleed 2019  ? hospitalized at [facility] for one week  ? Headache   ? since childhood  ? Low blood sugar   ? since childhood, controlled by diet  ? Mitral regurgitation   ? Mitral valve prolapse   ? Osteopenia 2021  ? Paroxysmal atrial fibrillation (HCC)   ? ? ?Past Surgical History:  ?Procedure Laterality Date  ? COLONOSCOPY WITH PROPOFOL N/A 02/19/2017  ? Procedure: COLONOSCOPY WITH PROPOFOL;  Surgeon: Wilford Corner, MD;  Location: [facility];  Service: Endoscopy;  Laterality: N/A;  ? IR GASTROSTOMY TUBE MOD SED  10/18/2020  ? IR IVC FILTER PLMT / S&I /IMG GUID/MOD SED  11/01/2020  ?  laser eye surgery for retina detachment    ? MITRAL VALVE REPAIR  01/2003  ? monitor  02/05/2006  ? polyp removal    ? TONSILLECTOMY    ? tooth removal    ? as a teenager  ? TRACHEOSTOMY TUBE PLACEMENT N/A 10/26/2020  ? Procedure: TRACHEOSTOMY;  Surgeon: Rozetta Nunnery, MD;   Location: [place];  Service: ENT;  Laterality: N/A;  ? ? ?Social History: ? reports that he has never smoked. He has never used smokeless tobacco. He reports current alcohol use. He reports that he does not use drugs. ? ?Allergies  ?Allergen Reactions  ? Prednisone Other (See Comments)  ?  Makes "skin crawl," rapid HR- "Allergic," per MAR  ? Cortisone Palpitations and Other (See Comments)  ?  "Allergic," per MAR  ? ? ?Family History  ?Problem Relation Age of Onset  ? Heart disease Mother   ? CAD Mother   ? Migraines Mother   ? Heart failure Father   ? Skin cancer Father   ? Valvular heart disease Father   ?     prolapsed valve  ? Tremor Father   ?     "probably had a bit of tremor in his old age"  ? Heart failure Maternal Grandmother   ? Pneumonia Maternal Grandfather   ? Heart failure Paternal Grandmother   ? Stroke Paternal Grandfather   ? Asthma Daughter   ? Epilepsy Son   ? Parkinson's disease Neg Hx   ? ? ? ?Prior to Admission medications   ?Medication Sig Start Date End Date Taking? Authorizing Provider  ?esomeprazole (NEXIUM) 40 MG packet 40 mg See admin instructions. 40 mg, per tube, every morning   Yes [provider]  ?fluticasone (FLONASE) 50 MCG/ACT nasal spray Place 2 sprays into both nostrils in the morning and at bedtime.   Yes [provider]  ?GERI-TUSSIN 100 MG/5ML liquid Place 400 mg into feeding tube 3 (three) times daily.   Yes [provider]  ?levalbuterol (XOPENEX) 1.25 MG/3ML nebulizer solution Take 1.25 mg by nebulization every 6 (six) hours.   Yes [provider]  ?levETIRAcetam (KEPPRA) 100 MG/ML solution Place 1,000 mg into feeding tube in the morning and at bedtime.   Yes [provider]  ?midodrine (PROAMATINE) 10 MG tablet Place 1 tablet (10 mg total) into feeding tube 3 (three) times daily with meals. 05/01/21  Yes Thurnell Lose, MD  ?Nutritional Supplements (FEEDING SUPPLEMENT, JEVITY 1.5 CAL/FIBER,) LIQD Place 55 mL/hr into feeding tube  continuous.   Yes [provider]  ?Probiotic Product (CULTURELLE PROBIOTICS PO) Give 1 capsule by tube daily. 10 billion cell   Yes [provider]  ?tamsulosin (FLOMAX) 0.4 MG CAPS capsule 0.4 mg See admin instructions. 0.4 mg per tube at bedtime   Yes [provider]  ?zinc gluconate 50 MG tablet Place 50 mg into feeding tube 2 (two) times daily.   Yes [provider]  ?ALPRAZolam (XANAX) 0.5 MG tablet Place 1 tablet (0.5 mg total) into feeding tube every 8 (eight) hours as needed for anxiety. 05/31/21   Darliss Cheney, MD  ?Cholecalciferol (VITAMIN D3 SUPER STRENGTH) 50 MCG (2000 UT) TABS Give 2,000 Units by tube daily.    [provider]  ?escitalopram (LEXAPRO) 10 MG tablet Place 10 mg into feeding tube daily.    [provider]  ?fexofenadine (ALLEGRA) 180 MG tablet Place 180 mg into feeding tube daily.    [provider]  ?folic acid (  FOLVITE) 1 MG tablet Take 1 tablet (1 mg total) by mouth daily. ?Patient taking differently: Place 1 mg into feeding tube daily. 09/29/20   [name], MD  ?furosemide (LASIX) 20 MG tablet Place 1 tablet (20 mg total) into feeding tube daily as needed for fluid or edema. 04/16/21   [name], DO  ?hydrOXYzine (ATARAX) 25 MG tablet Place 25 mg into feeding tube every 6 (six) hours as needed for anxiety.    [provider]  ?lactulose (CHRONULAC) 10 GM/15ML solution Place 20 g into feeding tube 2 (two) times daily as needed for mild constipation.    [provider]  ?levalbuterol (XOPENEX HFA) 45 MCG/ACT inhaler Inhale 1 puff into the lungs in the morning and at bedtime.    [provider]  ?levETIRAcetam (KEPPRA) 1000 MG tablet Place 1 tablet (1,000 mg total) into feeding tube 2 (two) times daily. ?Patient not taking: Reported on 06/04/2021 05/31/21 06/30/21  Darliss Cheney, MD  ?levothyroxine (SYNTHROID) 25 MCG tablet Place 25 mcg into feeding tube daily before breakfast.     [provider]  ?Multiple Vitamin (MULTIVITAMIN WITH MINERALS) TABS tablet Place 1 tablet into feeding tube daily. 05/11/21   Kerney Elbe, DO  ?Nutritional Supplements (FEEDING SUPPLEMENT, JEVITY

## 2021-06-04 NOTE — ED Triage Notes (Signed)
Pt arrives via [facility] from [place] for seizure activity. Per staff pt was having focal seizures starting around 1700 today, no reported meds given. Upon [facility] arrival pt GCS 3, [facility] did not report any seizure activity. Per [facility] GCS now 7, pt has NS infusing at 128ml/hr in R PICC line.  ?

## 2021-06-05 DIAGNOSIS — R0902 Hypoxemia: Secondary | ICD-10-CM | POA: Diagnosis not present

## 2021-06-05 DIAGNOSIS — N390 Urinary tract infection, site not specified: Secondary | ICD-10-CM | POA: Diagnosis present

## 2021-06-05 DIAGNOSIS — Z930 Tracheostomy status: Secondary | ICD-10-CM | POA: Diagnosis not present

## 2021-06-05 DIAGNOSIS — Z9911 Dependence on respirator [ventilator] status: Secondary | ICD-10-CM | POA: Diagnosis not present

## 2021-06-05 DIAGNOSIS — E039 Hypothyroidism, unspecified: Secondary | ICD-10-CM

## 2021-06-05 DIAGNOSIS — G40909 Epilepsy, unspecified, not intractable, without status epilepticus: Secondary | ICD-10-CM

## 2021-06-05 DIAGNOSIS — I480 Paroxysmal atrial fibrillation: Secondary | ICD-10-CM

## 2021-06-05 DIAGNOSIS — F419 Anxiety disorder, unspecified: Secondary | ICD-10-CM | POA: Diagnosis present

## 2021-06-05 DIAGNOSIS — L89151 Pressure ulcer of sacral region, stage 1: Secondary | ICD-10-CM | POA: Diagnosis present

## 2021-06-05 DIAGNOSIS — G20 Parkinson's disease: Secondary | ICD-10-CM | POA: Diagnosis present

## 2021-06-05 DIAGNOSIS — R4182 Altered mental status, unspecified: Secondary | ICD-10-CM

## 2021-06-05 DIAGNOSIS — J9621 Acute and chronic respiratory failure with hypoxia: Secondary | ICD-10-CM | POA: Diagnosis not present

## 2021-06-05 DIAGNOSIS — G9341 Metabolic encephalopathy: Secondary | ICD-10-CM

## 2021-06-05 DIAGNOSIS — I468 Cardiac arrest due to other underlying condition: Secondary | ICD-10-CM | POA: Diagnosis not present

## 2021-06-05 DIAGNOSIS — J9611 Chronic respiratory failure with hypoxia: Secondary | ICD-10-CM

## 2021-06-05 DIAGNOSIS — J439 Emphysema, unspecified: Secondary | ICD-10-CM

## 2021-06-05 DIAGNOSIS — I9589 Other hypotension: Secondary | ICD-10-CM

## 2021-06-05 DIAGNOSIS — F418 Other specified anxiety disorders: Secondary | ICD-10-CM | POA: Diagnosis not present

## 2021-06-05 DIAGNOSIS — I5042 Chronic combined systolic (congestive) and diastolic (congestive) heart failure: Secondary | ICD-10-CM | POA: Diagnosis present

## 2021-06-05 DIAGNOSIS — R64 Cachexia: Secondary | ICD-10-CM | POA: Diagnosis present

## 2021-06-05 DIAGNOSIS — J449 Chronic obstructive pulmonary disease, unspecified: Secondary | ICD-10-CM | POA: Diagnosis present

## 2021-06-05 DIAGNOSIS — R0989 Other specified symptoms and signs involving the circulatory and respiratory systems: Secondary | ICD-10-CM | POA: Diagnosis not present

## 2021-06-05 DIAGNOSIS — Z931 Gastrostomy status: Secondary | ICD-10-CM | POA: Diagnosis not present

## 2021-06-05 DIAGNOSIS — T83511A Infection and inflammatory reaction due to indwelling urethral catheter, initial encounter: Secondary | ICD-10-CM | POA: Diagnosis present

## 2021-06-05 DIAGNOSIS — R569 Unspecified convulsions: Secondary | ICD-10-CM

## 2021-06-05 DIAGNOSIS — Z66 Do not resuscitate: Secondary | ICD-10-CM | POA: Diagnosis present

## 2021-06-05 DIAGNOSIS — Y846 Urinary catheterization as the cause of abnormal reaction of the patient, or of later complication, without mention of misadventure at the time of the procedure: Secondary | ICD-10-CM | POA: Diagnosis present

## 2021-06-05 DIAGNOSIS — J9503 Malfunction of tracheostomy stoma: Secondary | ICD-10-CM | POA: Diagnosis not present

## 2021-06-05 DIAGNOSIS — E43 Unspecified severe protein-calorie malnutrition: Secondary | ICD-10-CM | POA: Diagnosis present

## 2021-06-05 DIAGNOSIS — J989 Respiratory disorder, unspecified: Secondary | ICD-10-CM | POA: Diagnosis not present

## 2021-06-05 DIAGNOSIS — Z430 Encounter for attention to tracheostomy: Secondary | ICD-10-CM | POA: Diagnosis not present

## 2021-06-05 DIAGNOSIS — J9620 Acute and chronic respiratory failure, unspecified whether with hypoxia or hypercapnia: Secondary | ICD-10-CM | POA: Diagnosis not present

## 2021-06-05 DIAGNOSIS — I2510 Atherosclerotic heart disease of native coronary artery without angina pectoris: Secondary | ICD-10-CM | POA: Diagnosis present

## 2021-06-05 DIAGNOSIS — Z95828 Presence of other vascular implants and grafts: Secondary | ICD-10-CM | POA: Diagnosis not present

## 2021-06-05 DIAGNOSIS — F32A Depression, unspecified: Secondary | ICD-10-CM | POA: Diagnosis present

## 2021-06-05 DIAGNOSIS — Z431 Encounter for attention to gastrostomy: Secondary | ICD-10-CM | POA: Diagnosis not present

## 2021-06-05 DIAGNOSIS — I469 Cardiac arrest, cause unspecified: Secondary | ICD-10-CM | POA: Diagnosis not present

## 2021-06-05 DIAGNOSIS — Z86718 Personal history of other venous thrombosis and embolism: Secondary | ICD-10-CM | POA: Diagnosis not present

## 2021-06-05 DIAGNOSIS — Z681 Body mass index (BMI) 19 or less, adult: Secondary | ICD-10-CM | POA: Diagnosis not present

## 2021-06-05 LAB — BASIC METABOLIC PANEL
Anion gap: 6 (ref 5–15)
BUN: 13 mg/dL (ref 8–23)
CO2: 23 mmol/L (ref 22–32)
Calcium: 8.8 mg/dL — ABNORMAL LOW (ref 8.9–10.3)
Chloride: 105 mmol/L (ref 98–111)
Creatinine, Ser: 0.52 mg/dL — ABNORMAL LOW (ref 0.61–1.24)
GFR, Estimated: >60 mL/min (ref >60)
Glucose, Bld: 100 mg/dL — ABNORMAL HIGH (ref 70–99)
Potassium: 4.2 mmol/L (ref 3.5–5.1)
Sodium: 134 mmol/L — ABNORMAL LOW (ref 135–145)

## 2021-06-05 LAB — MRSA NEXT GEN BY PCR, NASAL: MRSA by PCR Next Gen: NOT DETECTED

## 2021-06-05 LAB — GLUCOSE, CAPILLARY
Glucose-Capillary: 84 mg/dL (ref 70–99)
Glucose-Capillary: 94 mg/dL (ref 70–99)

## 2021-06-05 LAB — CBC
HCT: 28 % — ABNORMAL LOW (ref 39.0–52.0)
Hemoglobin: 9.3 g/dL — ABNORMAL LOW (ref 13.0–17.0)
MCH: 29.2 pg (ref 26.0–34.0)
MCHC: 33.2 g/dL (ref 30.0–36.0)
MCV: 88.1 fL (ref 80.0–100.0)
Platelets: 216 10*3/uL (ref 150–400)
RBC: 3.18 MIL/uL — ABNORMAL LOW (ref 4.22–5.81)
RDW: 16.6 % — ABNORMAL HIGH (ref 11.5–15.5)
WBC: 8.7 10*3/uL (ref 4.0–10.5)
nRBC: 0 % (ref 0.0–0.2)

## 2021-06-05 LAB — MAGNESIUM
Magnesium: 2.1 mg/dL (ref 1.7–2.4)
Magnesium: 2.1 mg/dL (ref 1.7–2.4)
Magnesium: 1.9 mg/dL (ref 1.7–2.4)

## 2021-06-05 LAB — PHOSPHORUS
Phosphorus: 2.9 mg/dL (ref 2.5–4.6)
Phosphorus: 3 mg/dL (ref 2.5–4.6)

## 2021-06-05 LAB — CBG MONITORING, ED
Glucose-Capillary: 108 mg/dL — ABNORMAL HIGH (ref 70–99)
Glucose-Capillary: 90 mg/dL (ref 70–99)

## 2021-06-05 MED ORDER — ENOXAPARIN SODIUM 40 MG/0.4ML IJ SOSY
40.0000 mg | PREFILLED_SYRINGE | INTRAMUSCULAR | Status: DC
  Administered 2021-06-05 – 2021-06-08 (×4): 40 mg via SUBCUTANEOUS
  Filled 2021-06-05 (×5): qty 0.4

## 2021-06-05 MED ORDER — ONDANSETRON HCL 4 MG/2ML IJ SOLN: 4.0000 mg | Freq: Four times a day (QID) | INTRAMUSCULAR | Status: DC | PRN

## 2021-06-05 MED ORDER — ESCITALOPRAM OXALATE 10 MG PO TABS
10.0000 mg | ORAL_TABLET | Freq: Every day | ORAL | Status: DC
  Administered 2021-06-05 – 2021-06-08 (×4): 10 mg
  Filled 2021-06-05 (×5): qty 1

## 2021-06-05 MED ORDER — LACTULOSE 10 GM/15ML PO SOLN: 20.0000 g | Freq: Two times a day (BID) | ORAL | Status: DC | PRN

## 2021-06-05 MED ORDER — PROPRANOLOL HCL 20 MG PO TABS: 10.0000 mg | ORAL_TABLET | Freq: Every day | ORAL | Status: DC | PRN

## 2021-06-05 MED ORDER — CHLORHEXIDINE GLUCONATE CLOTH 2 % EX PADS
6.0000 | MEDICATED_PAD | Freq: Every day | CUTANEOUS | Status: DC
  Administered 2021-06-05 – 2021-06-07 (×3): 6 via TOPICAL

## 2021-06-05 MED ORDER — LEVOTHYROXINE SODIUM 25 MCG PO TABS
25.0000 ug | ORAL_TABLET | Freq: Every day | ORAL | Status: DC
  Administered 2021-06-05 – 2021-06-08 (×4): 25 ug
  Filled 2021-06-05 (×4): qty 1

## 2021-06-05 MED ORDER — FOLIC ACID 1 MG PO TABS
1.0000 mg | ORAL_TABLET | Freq: Every day | ORAL | Status: DC
  Administered 2021-06-05 – 2021-06-08 (×4): 1 mg
  Filled 2021-06-05 (×5): qty 1

## 2021-06-05 MED ORDER — ONDANSETRON HCL 4 MG PO TABS: 4.0000 mg | ORAL_TABLET | Freq: Four times a day (QID) | ORAL | Status: DC | PRN

## 2021-06-05 MED ORDER — PANTOPRAZOLE 2 MG/ML SUSPENSION
40.0000 mg | Freq: Two times a day (BID) | ORAL | Status: DC
  Administered 2021-06-05 – 2021-06-08 (×7): 40 mg
  Filled 2021-06-05 (×8): qty 20

## 2021-06-05 MED ORDER — ACETAMINOPHEN 650 MG RE SUPP: 650.0000 mg | Freq: Four times a day (QID) | RECTAL | Status: DC | PRN

## 2021-06-05 MED ORDER — ADULT MULTIVITAMIN W/MINERALS CH
1.0000 | ORAL_TABLET | Freq: Every day | ORAL | Status: DC
  Administered 2021-06-05 – 2021-06-08 (×4): 1
  Filled 2021-06-05 (×5): qty 1

## 2021-06-05 MED ORDER — LEVETIRACETAM 100 MG/ML PO SOLN
1000.0000 mg | Freq: Two times a day (BID) | ORAL | Status: DC
  Administered 2021-06-05 – 2021-06-08 (×7): 1000 mg
  Filled 2021-06-05 (×8): qty 10

## 2021-06-05 MED ORDER — VITAL HIGH PROTEIN PO LIQD: 1000.0000 mL | ORAL | Status: DC

## 2021-06-05 MED ORDER — SODIUM CHLORIDE 0.9% FLUSH
3.0000 mL | Freq: Two times a day (BID) | INTRAVENOUS | Status: DC
  Administered 2021-06-05 – 2021-06-07 (×5): 3 mL via INTRAVENOUS

## 2021-06-05 MED ORDER — JEVITY 1.5 CAL/FIBER PO LIQD: 55.0000 mL/h | ORAL | Status: DC

## 2021-06-05 MED ORDER — MIDODRINE HCL 5 MG PO TABS
10.0000 mg | ORAL_TABLET | Freq: Three times a day (TID) | ORAL | Status: DC
  Administered 2021-06-05 – 2021-06-08 (×11): 10 mg
  Filled 2021-06-05 (×11): qty 2

## 2021-06-05 MED ORDER — ORAL CARE MOUTH RINSE
15.0000 mL | OROMUCOSAL | Status: DC
  Administered 2021-06-06 – 2021-06-08 (×23): 15 mL via OROMUCOSAL

## 2021-06-05 MED ORDER — PROSOURCE TF PO LIQD
45.0000 mL | Freq: Two times a day (BID) | ORAL | Status: DC
  Administered 2021-06-05 – 2021-06-08 (×7): 45 mL
  Filled 2021-06-05 (×8): qty 45

## 2021-06-05 MED ORDER — CHLORHEXIDINE GLUCONATE 0.12% ORAL RINSE (MEDLINE KIT)
15.0000 mL | Freq: Two times a day (BID) | OROMUCOSAL | Status: DC
  Administered 2021-06-06 – 2021-06-08 (×6): 15 mL via OROMUCOSAL

## 2021-06-05 MED ORDER — ALPRAZOLAM 0.25 MG PO TABS
0.5000 mg | ORAL_TABLET | Freq: Three times a day (TID) | ORAL | Status: DC | PRN
  Administered 2021-06-05 – 2021-06-08 (×7): 0.5 mg
  Filled 2021-06-05 (×7): qty 2

## 2021-06-05 MED ORDER — ACETAMINOPHEN 325 MG PO TABS
650.0000 mg | ORAL_TABLET | Freq: Four times a day (QID) | ORAL | Status: DC | PRN
  Administered 2021-06-05 – 2021-06-08 (×8): 650 mg
  Filled 2021-06-05 (×8): qty 2

## 2021-06-05 MED ORDER — LEVALBUTEROL HCL 1.25 MG/0.5ML IN NEBU
1.2500 mg | INHALATION_SOLUTION | Freq: Four times a day (QID) | RESPIRATORY_TRACT | Status: DC
  Administered 2021-06-05 – 2021-06-08 (×13): 1.25 mg via RESPIRATORY_TRACT
  Filled 2021-06-05 (×16): qty 0.5

## 2021-06-05 NOTE — Assessment & Plan Note (Signed)
Notable tremors on admission.  Continue propranolol daily as needed for tremors with hold parameters. ?

## 2021-06-05 NOTE — Hospital Course (Signed)
Austin Davis is a 81 y.o. 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 ?

## 2021-06-05 NOTE — Progress Notes (Signed)
RT assisted with transport of this pt from ED to [id number] while on full ventilatory support. Pt tolerated well with SVS. RT gave report to 58M RT. ?

## 2021-06-05 NOTE — Assessment & Plan Note (Signed)
Recent admit for seizure-like activity.  None noted at time of admission. ?-Continue Keppra 1000 mg twice daily per tube ?-Follow EEG ?

## 2021-06-05 NOTE — ED Notes (Addendum)
3W does not take trach vented patients.  Only 4NICU, 61M, 24M and 2H ?

## 2021-06-05 NOTE — Assessment & Plan Note (Signed)
Continue meds and feeds per tube. ?

## 2021-06-05 NOTE — Progress Notes (Signed)
Initial Nutrition Assessment ? ?DOCUMENTATION CODES:  ? ?Underweight ? ?INTERVENTION:  ? ?Initiate tube feeding via PEG: ?Jevity 1.5 at 55 ml/h (1320 ml per day) ?Prosource TF 45 ml BID ?  ?Provides 2060 kcal, 106 gm protein, 1003 ml free water daily. ?  ?MVI with minerals daily via tube ? ?NUTRITION DIAGNOSIS:  ? ?Inadequate oral intake related to inability to eat as evidenced by NPO status. ? ?GOAL:  ? ?Patient will meet greater than or equal to 90% of their needs ? ?MONITOR:  ? ?Vent status, TF tolerance ? ?REASON FOR ASSESSMENT:  ? ?Consult ?Enteral/tube feeding initiation and management, Assessment of nutrition requirement/status ? ?ASSESSMENT:  ? 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 ? ?Pt admitted with UTI secondary to indwelling urethral catheter and acute metabolic encephalopathy.  ? ?9/29- s/p g-tube placement ?5/17- s/p EEG- suggests severe diffuse encephalopathy, non-specific etiology ?  ?Patient on chronic vent support via trach. Noted pt was recent discharged to [facility].  ?MV: 12.1 L/min ?Temp (24hrs), Avg:98.2 ?F (36.8 ?C), Min:97.9 ?F (36.6 ?C), Max:98.4 ?F (36.9 ?C) ? ?Reviewed I/O's: +1.4 L x 24 hours ? ?Received page from RN in ED. She reports that MD is requesting resuming TF, but is requesting RD evaluation prior to hanging.  ? ?RD unable to obtain further nutrition-related history or complete nutrition-focused physical exam at this time. Pt was identified with severe malnutrition in the context of chronic illness on previous admission; suspect this is ongoing.  ? ?Reviewed wt hx; pt has  experienced a 3.2% wt loss over the past month, which is not significant for time frame.  ? ?Medications reviewed and include folic acid and keppra.  ? ?Labs reviewed: Na: 134, CBGS: 150 (inpatient orders for glycemic control are none).   ? ?Diet Order:   ?Diet Order   ? ?       ?  Diet NPO time specified  Diet effective now       ?  ? ?  ?  ? ?  ? ? ?EDUCATION NEEDS:  ? ?No education needs have been identified at this time ? ?Skin:  Skin Assessment: Skin Integrity Issues: ?Skin Integrity Issues:: Stage I ?Stage I: sacrum (epithelialized) ? ?Last BM:  Unknown ? ?Height:  ? ?Ht Readings from Last 1 Encounters:  ?06/05/21 5\' 10"  (1.778 m)  ? ? ?Weight:  ? ?Wt Readings from Last 1 Encounters:  ?06/05/21 55.3 kg  ? ? ?Ideal Body Weight:  75.5 kg ? ?BMI:  Body mass index is 17.51 kg/m?. ? ?Estimated Nutritional Needs:  ? ?Kcal:  1700-1900 ? ?Protein:  85-100 grams ? ?Fluid:  > 1.7 L ? ? ? ?Loistine Chance, RD, LDN, CDCES ?Registered Dietitian II ?Certified Diabetes Care and Education Specialist ?Please refer to [facility] for RD and/or RD on-call/weekend/after hours pager  ?

## 2021-06-05 NOTE — Progress Notes (Addendum)
?PROGRESS NOTE ? ? ? ?Austin Davis  XBW:[id number] DOB: Oct 27, 1940 DOA: 06/04/2021 ?PCP: Aaron Edelman, MD ? ? ?Brief Narrative:  ?Austin Davis is a 81 y.o. 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  Patient is unable to provide history as he cannot speak over the vent and is otherwise supplemented by EDP, chart review, and daughter by phone. ? ?Patient recently admitted 05/29/2021-05/31/2021 for seizure-like activity. Keppra was increased from 750 mg twice daily to 1000 mg twice daily. He was also suspected to have UTI. Urine culture grew multiple species. He was treated with empiric Bactrim. ? ?Per ED documentation, patient was transferred from [facility] for seizure-like activity.  He was reportedly having focal seizures around 1700 on 5/16.  No medications were reportedly given. [facility] were called and on their arrival they did not see any seizure activity. ? ?Assessment & Plan: ?  ?Principal Problem: ?  Urinary tract infection associated with indwelling urethral catheter ([place]) ?Active Problems: ?  Acute metabolic encephalopathy ?  Ventilator dependent ([place]) ?  Chronic respiratory failure with hypoxia (HCC) ?  Paroxysmal atrial fibrillation (HCC) ?  Seizure disorder ([place]) ?  Chronic hypotension ?  Chronic obstructive pulmonary disease (HCC) ?  Parkinsonism ([place]) ?  Hypothyroidism ?  Gastrostomy tube dependent ([place]) ?  Depression with anxiety ? ? ?Urinary tract infection associated with indwelling urethral catheter ([place]) ?-Does NOT meet sepsis criteria ?-Given leukocytosis and  abnormal urinalysis, suspicion present encephalopathy secondary to UTI.  Urine culture 05/08/2021 grew VRE. ?-Continue IV linezolid - de-escalate as appropriate based on cultures ?  ?Acute metabolic encephalopathy, resolved ?- Patient sent from [facility] for reported change in mental status, questionable focal seizure-like activity. Appears to be back to baseline at this point. ?-Treat suspected UTI as above ?-EEG unremarkable for seizure-like activity ?  ?Chronic respiratory failure with hypoxia (HCC) ?S/p tracheostomy and chronically vent dependent ?Currently stable, continue current vent settings. ?  ?Seizure disorder ([place]) ?Recent admit for seizure-like activity without reported treatment at facility.  No seizure noted at time of admission. ?-Continue Keppra 1000 mg twice daily per tube ?-EEG unremarkable ?  ?Depression with anxiety ?-Continue home Lexapro and Xanax as needed. ?  ?Gastrostomy tube dependent ([place]) ?-Continue meds and feeds per tube. ?  ?Hypothyroidism ?-Continue Synthroid. ?  ?Parkinsonism ([place]) ?-Notable tremors on admission.  Continue propranolol daily as needed for tremors with hold parameters. ?  ?Chronic obstructive pulmonary disease ([place]) ?-Stable without acute exacerbation.  Continue Xopenex every 6 hours. ?  ?Chronic hypotension ?Continue midodrine 10 mg 3 times daily per tube. ? ?Pressure injury, POA ?Noted at admission, sacral - per nursing documentation ?  ? ?  ?DVT prophylaxis: enoxaparin (LOVENOX) injection 40 mg Start: 06/05/21 1000 ?Code Status: Partial code, no CPR.  Discussed with patient's daughter on admission. ?Family Communication: Family unavailable  ? ?Status is: Inpt ? ?Dispo: The patient is from: [place] ?             Anticipated d/c is to: Same ?             Anticipated d/c date is: 24-48h ?  Patient currently NOT medically stable for discharge ? ?Consultants:  ?None ? ?Procedures:  ?None ? ?Antimicrobials:  ?As above; linezolid  ? ?Subjective: ?No acute  issue/events this am - mental status appears to be back to baseline at interview today - patient essentially nonverbal due to trach and unable to write with tremors from parkinson's. ? ?Objective: ?Vitals:  ? 06/05/21 0404 06/05/21 0404 06/05/21 0615 06/05/21 0715  ?BP:   96/64 106/64  ?Pulse:   61 61  ?Resp:   18 (!) 21  ?Temp:  98.4 ?F (36.9 ?C)    ?TempSrc:  Oral    ?SpO2:   99% 100%  ?Weight: 55.3 kg     ?Height: 5\' 10"  (1.778 m)     ? ? ?Intake/Output Summary (Last 24 hours) at 06/05/2021 0730 ?Last data filed at 06/05/2021 0009 ?Gross per 24 hour  ?Intake 1400.47 ml  ?Output --  ?Net 1400.47 ml  ? ?Filed Weights  ? 06/05/21 0404  ?Weight: 55.3 kg  ? ? ?Examination: ? ?General:  Pleasantly resting in bed, No acute distress. ?HEENT:  Trach clean dry and intact. ?Neck:  Without mass or deformity.  Trachea is midline. ?Lungs:  Clear to auscultate bilaterally without rhonchi, wheeze, or rales. ?Heart:  Regular rate and rhythm.  Without murmurs, rubs, or gallops. ?Abdomen:  Soft, nontender, nondistended.  Without guarding or rebound. Peg/Foley clean dry intact. ?Extremities: Without cyanosis, clubbing, edema, or obvious deformity. ?Vascular:  Dorsalis pedis and posterior tibial pulses palpable bilaterally. ?Skin:  Warm and dry, no ecchymoses ? ?Data Reviewed: I have personally reviewed following labs and imaging studies ? ?CBC: ?Recent Labs  ?Lab 05/30/21 ?0030 05/30/21 ?0052 06/04/21 ?2002 06/04/21 ?2010 06/04/21 ?2104 06/04/21 ?2106 06/05/21 ?0401  ?WBC 14.9*  --   --  15.5*  --   --  8.7  ?NEUTROABS 14.0*  --   --  13.8*  --   --   --   ?HGB 12.2*   < > 10.5* 10.4* 10.5* 10.5* 9.3*  ?HCT 39.2   < > 31.0* 31.3* 31.0* 31.0* 28.0*  ?MCV 93.3  --   --  88.4  --   --  88.1  ?PLT 251  --   --  246  --   --  216  ? < > = values in this interval not displayed.  ? ?Basic Metabolic Panel: ?Recent Labs  ?Lab 05/30/21 ?0030 05/30/21 ?0052 06/04/21 ?2002 06/04/21 ?2010 06/04/21 ?2104 06/04/21 ?2106 06/05/21 ?0401  ?NA 136   < >  135 135 135 135 134*  ?K 4.6   < > 4.9 5.0 4.8 4.8 4.2  ?CL 101  --   --  105  --  103 105  ?CO2 24  --   --  22  --   --  23  ?GLUCOSE 220*  --   --  130*  --  128* 100*  ?BUN 27*  --   --  14  --  14 13  ?CREATININE 0.88  --   --  0.69  --  0.60* 0.52*  ?CALCIUM 9.6  --   --  9.2  --   --  8.8*  ?MG  --   --   --   --   --   --  2.1  ? < > = values in this interval not displayed.  ? ?GFR: ?Estimated Creatinine Clearance: 57.6 mL/min (A) (by C-G formula based on SCr of 0.52 mg/dL (L)). ?Liver Function Tests: ?Recent Labs  ?Lab  05/30/21 ?0030 06/04/21 ?2010  ?AST 33 19  ?ALT 53* 31  ?ALKPHOS 122 100  ?BILITOT 0.9 0.4  ?PROT 7.6 6.6  ?ALBUMIN 3.6 3.0*  ? ?No results for input(s): LIPASE, AMYLASE in the last 168 hours. ?Recent Labs  ?Lab 06/04/21 ?2010  ?AMMONIA 34  ? ?Coagulation Profile: ?No results for input(s): INR, PROTIME in the last 168 hours. ?Cardiac Enzymes: ?No results for input(s): CKTOTAL, CKMB, CKMBINDEX, TROPONINI in the last 168 hours. ?BNP (last 3 results) ?No results for input(s): PROBNP in the last 8760 hours. ?HbA1C: ?No results for input(s): HGBA1C in the last 72 hours. ?CBG: ?Recent Labs  ?Lab 05/30/21 ?5883 06/04/21 ?1930  ?GLUCAP 191* 150*  ? ?Lipid Profile: ?No results for input(s): CHOL, HDL, LDLCALC, TRIG, CHOLHDL, LDLDIRECT in the last 72 hours. ?Thyroid Function Tests: ?No results for input(s): TSH, T4TOTAL, FREET4, T3FREE, THYROIDAB in the last 72 hours. ?Anemia Panel: ?No results for input(s): VITAMINB12, FOLATE, FERRITIN, TIBC, IRON, RETICCTPCT in the last 72 hours. ?Sepsis Labs: ?Recent Labs  ?Lab 05/30/21 ?0030 06/04/21 ?1958  ?PROCALCITON <0.10  --   ?LATICACIDVEN  --  1.4  ? ? ?Recent Results (from the past 240 hour(s))  ?Urine Culture     Status: Abnormal  ? Collection Time: 05/30/21  4:50 AM  ? Specimen: Urine, Catheterized  ?Result Value Ref Range Status  ? Specimen Description URINE, CATHETERIZED  Final  ? Special Requests   Final  ?  Normal ?Performed at [facility] [place] [address] [place], [place] [address] ?  ? Culture MULTIPLE SPECIES PRESENT, SUGGEST RECOLLECTION (A)  Final  ? Report Status 05/31/2021 FINAL  Final  ?Resp Panel by RT-PCR (Flu A&B, Covid) Nasopharyngeal S

## 2021-06-05 NOTE — ED Notes (Signed)
Patient requesting lights off and curtain closed, informed patient that we are wiating on an admission bed upstairs.  ?

## 2021-06-05 NOTE — ED Notes (Signed)
Awaiting Jevity from dietary consult before starting tube feed.  ?

## 2021-06-05 NOTE — ED Notes (Signed)
Swabbed patients mouth with moist swabs to remove noted dry blood.  ?

## 2021-06-05 NOTE — Assessment & Plan Note (Signed)
Stable without acute exacerbation.  Continue Xopenex every 6 hours. ?

## 2021-06-05 NOTE — Assessment & Plan Note (Signed)
Continue home Lexapro and Xanax as needed. ?

## 2021-06-05 NOTE — Progress Notes (Signed)
Patient arrived from ED. Foley exchanged. Vital signs stable. See flowsheet for details. Report given to night shift RN, Legrand Como.  ?

## 2021-06-05 NOTE — ED Notes (Signed)
Called [facility] for purple man reported they will call back ?

## 2021-06-05 NOTE — Assessment & Plan Note (Signed)
Continue midodrine 10 mg 3 times daily per tube. ?

## 2021-06-05 NOTE — Assessment & Plan Note (Signed)
S/p tracheostomy and chronically vent dependent ?Currently stable, continue current vent settings. ?

## 2021-06-05 NOTE — Assessment & Plan Note (Signed)
Patient sent from [facility] for reported change in mental status, questionable focal seizure-like activity.  No seizure activity noted on arrival to ED or during admitting exam.  Mental status appears to be improving near baseline as he is easily arousable and following commands appropriately. ?-Treat suspected UTI as above ?-Follow EEG report ?

## 2021-06-05 NOTE — Assessment & Plan Note (Signed)
Given leukocytosis and abnormal urinalysis, suspicion present encephalopathy secondary to UTI.  Urine culture 05/08/2021 grew VRE. ?-Continue IV linezolid ?-Follow urine and blood cultures ?

## 2021-06-05 NOTE — ED Notes (Signed)
[facility] unable to take patient due to critical patient needing bed.  ?

## 2021-06-05 NOTE — Assessment & Plan Note (Signed)
Continue Synthroid °

## 2021-06-05 NOTE — Procedures (Signed)
Patient Name: Austin Davis  ?MRN: [id number]  ?Epilepsy Attending: Lora Havens  ?Referring Physician/Provider: Regan Lemming, MD ?Date: 06/04/2021 ?Duration: 21.36 mins ? ?Patient history: 81 year old male presented with seizure-like activity.  EEG evaluate for seizure. ? ?Level of alertness: lethargic ? ?AEDs during EEG study: LEV ? ?Technical aspects: This EEG study was done with scalp electrodes positioned according to the 10-20 International system of electrode placement. Electrical activity was acquired at a sampling rate of 500Hz  and reviewed with a high frequency filter of 70Hz  and a low frequency filter of 1Hz . EEG data were recorded continuously and digitally stored.  ? ?Description: EEG showed continuous generalized 3 to 7 Hz theta-delta slowing admixed with 15 to 18 Hz generalized beta activity. Hyperventilation and photic stimulation were not performed.    ? ?ABNORMALITY ?- Continuous slow, generalized ? ?IMPRESSION: ?This study is suggestive of severe diffuse encephalopathy, nonspecific etiology. No seizures or epileptiform discharges were seen throughout the recording. ? ?Lora Havens  ? ?

## 2021-06-06 DIAGNOSIS — I9589 Other hypotension: Secondary | ICD-10-CM | POA: Diagnosis not present

## 2021-06-06 DIAGNOSIS — G9341 Metabolic encephalopathy: Secondary | ICD-10-CM | POA: Diagnosis not present

## 2021-06-06 DIAGNOSIS — T83511A Infection and inflammatory reaction due to indwelling urethral catheter, initial encounter: Secondary | ICD-10-CM | POA: Diagnosis not present

## 2021-06-06 DIAGNOSIS — J439 Emphysema, unspecified: Secondary | ICD-10-CM | POA: Diagnosis not present

## 2021-06-06 LAB — PHOSPHORUS
Phosphorus: 3.1 mg/dL (ref 2.5–4.6)
Phosphorus: 3.5 mg/dL (ref 2.5–4.6)

## 2021-06-06 LAB — GLUCOSE, CAPILLARY
Glucose-Capillary: 78 mg/dL (ref 70–99)
Glucose-Capillary: 71 mg/dL (ref 70–99)
Glucose-Capillary: 110 mg/dL — ABNORMAL HIGH (ref 70–99)
Glucose-Capillary: 110 mg/dL — ABNORMAL HIGH (ref 70–99)
Glucose-Capillary: 115 mg/dL — ABNORMAL HIGH (ref 70–99)
Glucose-Capillary: 116 mg/dL — ABNORMAL HIGH (ref 70–99)
Glucose-Capillary: 112 mg/dL — ABNORMAL HIGH (ref 70–99)

## 2021-06-06 LAB — LEVETIRACETAM LEVEL: Levetiracetam Lvl: 31.6 ug/mL (ref 10.0–40.0)

## 2021-06-06 LAB — MAGNESIUM
Magnesium: 1.9 mg/dL (ref 1.7–2.4)
Magnesium: 2 mg/dL (ref 1.7–2.4)

## 2021-06-06 MED ORDER — LORATADINE 10 MG PO TABS
10.0000 mg | ORAL_TABLET | Freq: Every day | ORAL | Status: DC
  Filled 2021-06-06: qty 1

## 2021-06-06 MED ORDER — LINEZOLID 600 MG PO TABS
600.0000 mg | ORAL_TABLET | Freq: Two times a day (BID) | ORAL | Status: AC
  Administered 2021-06-06 – 2021-06-07 (×3): 600 mg
  Filled 2021-06-06 (×3): qty 1

## 2021-06-06 MED ORDER — OMEGA-3-ACID ETHYL ESTERS 1 G PO CAPS
1.0000 g | ORAL_CAPSULE | Freq: Every day | ORAL | Status: DC
  Administered 2021-06-07 – 2021-06-08 (×2): 1 g
  Filled 2021-06-06 (×3): qty 1

## 2021-06-06 MED ORDER — SODIUM CHLORIDE 0.9% FLUSH
10.0000 mL | Freq: Two times a day (BID) | INTRAVENOUS | Status: DC
  Administered 2021-06-06 – 2021-06-08 (×3): 10 mL

## 2021-06-06 MED ORDER — SACCHAROMYCES BOULARDII 250 MG PO CAPS
250.0000 mg | ORAL_CAPSULE | Freq: Every day | ORAL | Status: DC
  Administered 2021-06-06 – 2021-06-08 (×3): 250 mg
  Filled 2021-06-06 (×4): qty 1

## 2021-06-06 MED ORDER — GUAIFENESIN 100 MG/5ML PO LIQD
400.0000 mg | Freq: Three times a day (TID) | ORAL | Status: DC
  Administered 2021-06-06 – 2021-06-08 (×5): 400 mg
  Filled 2021-06-06: qty 20
  Filled 2021-06-06: qty 10
  Filled 2021-06-06: qty 40
  Filled 2021-06-06: qty 20
  Filled 2021-06-06: qty 10

## 2021-06-06 MED ORDER — SALINE SPRAY 0.65 % NA SOLN
1.0000 | NASAL | Status: DC | PRN
  Administered 2021-06-06: 1 via NASAL
  Filled 2021-06-06: qty 44

## 2021-06-06 MED ORDER — HYDROXYZINE HCL 25 MG PO TABS
25.0000 mg | ORAL_TABLET | Freq: Four times a day (QID) | ORAL | Status: DC | PRN
  Administered 2021-06-07 – 2021-06-08 (×2): 25 mg
  Filled 2021-06-06 (×2): qty 1

## 2021-06-06 MED ORDER — SODIUM CHLORIDE 0.9% FLUSH: 10.0000 mL | INTRAVENOUS | Status: DC | PRN

## 2021-06-06 MED ORDER — LORATADINE 10 MG PO TABS
10.0000 mg | ORAL_TABLET | Freq: Every day | ORAL | Status: DC
  Administered 2021-06-06 – 2021-06-08 (×3): 10 mg
  Filled 2021-06-06 (×3): qty 1

## 2021-06-06 MED ORDER — JEVITY 1.5 CAL/FIBER PO LIQD
1000.0000 mL | ORAL | Status: DC
  Administered 2021-06-06 – 2021-06-08 (×3): 1000 mL
  Filled 2021-06-06 (×4): qty 1000

## 2021-06-06 MED ORDER — FLUTICASONE PROPIONATE 50 MCG/ACT NA SUSP
2.0000 | Freq: Every day | NASAL | Status: DC
  Administered 2021-06-06 – 2021-06-08 (×3): 2 via NASAL
  Filled 2021-06-06: qty 16

## 2021-06-06 MED ORDER — SODIUM CHLORIDE 0.9 % IV SOLN: INTRAVENOUS | Status: DC | PRN

## 2021-06-06 MED ORDER — FUROSEMIDE 20 MG PO TABS: 20.0000 mg | ORAL_TABLET | Freq: Every day | ORAL | Status: DC | PRN

## 2021-06-06 NOTE — TOC Progression Note (Signed)
Transition of Care [facility]) - Initial/Assessment Note    Patient Details  Name: Austin Davis MRN: [id number] Date of Birth: 01-Mar-1940  Transition of Care [facility]) CM/SW Contact:    Milinda Antis, [place] Phone Number: 06/06/2021, 9:48 AM  Clinical Narrative:                 CSW contacted Festus Barren in admissions at [facility] and confirmed that the patient is a resident there and can return when medically ready.    TOC following.        Patient Goals and CMS Choice        Expected Discharge Plan and Services                                                Prior Living Arrangements/Services                       Activities of Daily Living      Permission Sought/Granted                  Emotional Assessment              Admission diagnosis:  Seizure-like activity ([place]) [R56.9] Urinary tract infection without hematuria, site unspecified [N39.0] Altered mental status, unspecified altered mental status type [Q73.41] Acute metabolic encephalopathy [P37.90] Patient Active Problem List   Diagnosis Date Noted   Chronic respiratory failure with hypoxia ([place]) 06/04/2021   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   Impaired quality of life 05/30/2021   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 05/15/2021   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[place]) 05/08/2021   Pneumonia 05/08/2021   Hyperkalemia 05/08/2021   Urinary tract infection associated with indwelling urethral catheter ([place]) 05/08/2021   Encephalopathy 05/08/2021   Pressure injury of skin 04/30/2021   Seizure disorder ([place]) 04/29/2021   Pneumothorax 04/11/2021   Ventilator dependent ([place])    Aspiration pneumonia ([place]) 03/25/2021   Parkinsonism ([place]) 03/25/2021   Obstructive sleep apnea 10/24/2020   Chronic obstructive pulmonary disease ([place]) 10/24/2020   Chronic heart failure with preserved ejection fraction ([place]) 10/24/2020    Unintentional weight loss 24/09/7351   Acute metabolic encephalopathy 29/92/4268   SVT (supraventricular tachycardia) ([place]) 09/20/2020   Protein-calorie malnutrition, severe 09/17/2020   Acute on chronic respiratory failure with hypoxia and hypercapnia ([place]) 09/16/2020   Failure to thrive in adult 09/16/2020   AF (paroxysmal atrial fibrillation) ([place]) 09/16/2020   Stage 1 skin ulcer of sacral region ([place]) 09/16/2020   Chronic hypotension 06/30/2020   History of GI bleed 02/17/2017   Rectal bleeding 02/16/2017   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 [place]. [place] [address] Phone: [phone] Fax: [phone]     Social Determinants of Health (SDOH) Interventions    Readmission Risk Interventions     View : No data to display.

## 2021-06-06 NOTE — Consult Note (Signed)
[facility] Inpatient Consult   06/06/2021  BRAXTIN BAMBA 06-05-1940 [id number]  [place] [facility] CM)   Patient chart has been reviewed with noted extreme high risk score for unplanned readmissions.  Patient assessed for community [place] Management follow up needs. Per review, patient admitted from [facility] and plan to return to SNF. No [facility] CM needs.   Of note, [facility] Care Management services does not replace or interfere with any services that are arranged by inpatient case management or social work.    Netta Cedars, MSN, RN [facility] Liaison Phone [phone] Toll free office [phone]

## 2021-06-06 NOTE — Progress Notes (Signed)
PROGRESS NOTE    Austin Davis  BCW:[id number] DOB: [birth year] DOA: 06/04/2021 PCP: Aaron Edelman, MD   Brief Narrative:  Austin Davis is a 81 y.o. male with medical history significant for chronic hypoxic respiratory failure s/p tracheostomy and vent dependent, chronic combined systolic and diastolic CHF (last EF 03-88%), PAF s/p maze, history of DVT, not on anticoagulation due to history of GI bleeding, s/p IVC filter, G-tube dependent, CAD, seizure disorder, parkinsonism, anxiety, hypothyroidism, chronic hypotension who presented to the ED from [facility] for evaluation of encephalopathy.  Patient is unable to provide history as he cannot speak over the vent and is otherwise supplemented by EDP, chart review, and daughter by phone.  Patient recently admitted 05/29/2021-05/31/2021 for seizure-like activity. Keppra was increased from 750 mg twice daily to 1000 mg twice daily. He was also suspected to have UTI. Urine culture grew multiple species. He was treated with empiric Bactrim.  Per ED documentation, patient was transferred from [facility] for seizure-like activity.  He was reportedly having focal seizures around 1700 on 5/16.  No medications were reportedly given. [facility] were called and on their arrival they did not see any seizure activity.  Assessment & Plan:   Principal Problem:   Urinary tract infection associated with indwelling urethral catheter (HCC) Active Problems:   Acute metabolic encephalopathy   Ventilator dependent (HCC)   Chronic respiratory failure with hypoxia (HCC)   Paroxysmal atrial fibrillation (HCC)   Seizure disorder (HCC)   Chronic hypotension   Chronic obstructive pulmonary disease (HCC)   Parkinsonism (HCC)   Hypothyroidism   Gastrostomy tube dependent ([name])   Depression with anxiety   Urinary tract infection associated with indwelling urethral catheter ([name]) -Does NOT meet sepsis criteria -Given leukocytosis and  abnormal urinalysis, suspicion present encephalopathy secondary to UTI.  Urine culture 05/08/2021 grew VRE - cultures growing VRE again here - questionably colonized - continue for 3 days.   Acute metabolic encephalopathy, resolved - Patient sent from [facility] for reported change in mental status, questionable focal seizure-like activity. Appears to be back to baseline at this point. - Treat UTI as above - EEG unremarkable for seizure-like activity   Chronic respiratory failure with hypoxia (HCC) S/p tracheostomy and chronically vent dependent Currently stable, continue current vent settings.   Seizure disorder [facility]) Recent admit for seizure-like activity without reported treatment at facility.  No seizure noted at time of admission. -Continue Keppra 1000 mg twice daily per tube -EEG unremarkable   Depression with anxiety -Continue home Lexapro and Xanax as needed.   Gastrostomy tube dependent ([place]) -Continue meds and feeds per tube.   Hypothyroidism -Continue Synthroid.   Essential tremor versus parkinsonism ([place]) -Notable tremors on admission.  Continue propranolol daily as needed for tremors with hold parameters.   Chronic obstructive pulmonary disease (HCC) -Stable without acute exacerbation.  Continue Xopenex every 6 hours.   Chronic hypotension Continue midodrine 10 mg 3 times daily per tube.  Pressure injury, POA Noted at admission, sacral - per nursing documentation Pressure Injury 05/08/21 Sacrum Medial;Lower Stage 1 -  Intact skin with non-blanchable redness of a localized area usually over a bony prominence. (Active)  05/08/21 0041  Location: Sacrum  Location Orientation: Medial;Lower  Staging: Stage 1 -  Intact skin with non-blanchable redness of a localized area usually over a bony prominence.  Wound Description (Comments):   Present on Admission: Yes      DVT prophylaxis: enoxaparin (LOVENOX) injection 40 mg Start: 06/05/21 1000 Code Status:  Partial  code, no CPR.  Discussed with patient's daughter again today - will discard yellow DNR form that came with EMS. Family Communication: Daughter updated over the phone  Status is: Inpt  Dispo: The patient is from: [facility]              Anticipated d/c is to: Same              Anticipated d/c date is: 24-48h              Patient currently NOT medically stable for discharge  Consultants:  None  Procedures:  None  Antimicrobials:  As above; linezolid   Subjective: No acute issue/events this am - mental status appears to be back to baseline at interview today - patient essentially nonverbal due to trach and unable to write with tremors from parkinson's.  Objective: Vitals:   06/06/21 1100 06/06/21 1116 06/06/21 1137 06/06/21 1200  BP: (!) 99/56 (!) 99/56  133/67  Pulse: 73 88  69  Resp: (!) 22 18  (!) 22  Temp:   97.9 F (36.6 C)   TempSrc:   Oral   SpO2: 90% 92%  94%  Weight:      Height:        Intake/Output Summary (Last 24 hours) at 06/06/2021 1309 Last data filed at 06/06/2021 1213 Gross per 24 hour  Intake 4190.09 ml  Output 3826 ml  Net 364.09 ml    Filed Weights   06/05/21 0404 06/06/21 0500  Weight: 55.3 kg 55.7 kg    Examination:  General:  Pleasantly resting in bed, No acute distress. HEENT:  Trach clean dry and intact. Neck:  Without mass or deformity.  Trachea is midline. Lungs:  Clear to auscultate bilaterally without rhonchi, wheeze, or rales. Heart:  Regular rate and rhythm.  Without murmurs, rubs, or gallops. Abdomen:  Soft, nontender, nondistended.  Without guarding or rebound. Peg/Foley clean dry intact. Extremities: Without cyanosis, clubbing, edema, or obvious deformity. Vascular:  Dorsalis pedis and posterior tibial pulses palpable bilaterally. Skin:  Warm and dry, no ecchymoses  Data Reviewed: I have personally reviewed following labs and imaging studies  CBC: Recent Labs  Lab 06/04/21 2002 06/04/21 2010 06/04/21 2104 06/04/21 2106  06/05/21 0401  WBC  --  15.5*  --   --  8.7  NEUTROABS  --  13.8*  --   --   --   HGB 10.5* 10.4* 10.5* 10.5* 9.3*  HCT 31.0* 31.3* 31.0* 31.0* 28.0*  MCV  --  88.4  --   --  88.1  PLT  --  246  --   --  250    Basic Metabolic Panel: Recent Labs  Lab 06/04/21 2002 06/04/21 2010 06/04/21 2104 06/04/21 2106 06/05/21 0401 06/05/21 1153 06/05/21 1710 06/06/21 0614  NA 135 135 135 135 134*  --   --   --   K 4.9 5.0 4.8 4.8 4.2  --   --   --   CL  --  105  --  103 105  --   --   --   CO2  --  22  --   --  23  --   --   --   GLUCOSE  --  130*  --  128* 100*  --   --   --   BUN  --  14  --  14 13  --   --   --   CREATININE  --  0.69  --  0.60* 0.52*  --   --   --   CALCIUM  --  9.2  --   --  8.8*  --   --   --   MG  --   --   --   --  2.1 2.1 1.9 1.9  PHOS  --   --   --   --   --  2.9 3.0 3.1    GFR: Estimated Creatinine Clearance: 58 mL/min (A) (by C-G formula based on SCr of 0.52 mg/dL (L)). Liver Function Tests: Recent Labs  Lab 06/04/21 2010  AST 19  ALT 31  ALKPHOS 100  BILITOT 0.4  PROT 6.6  ALBUMIN 3.0*    No results for input(s): LIPASE, AMYLASE in the last 168 hours. Recent Labs  Lab 06/04/21 2010  AMMONIA 34    Coagulation Profile: No results for input(s): INR, PROTIME in the last 168 hours. Cardiac Enzymes: No results for input(s): CKTOTAL, CKMB, CKMBINDEX, TROPONINI in the last 168 hours. BNP (last 3 results) No results for input(s): PROBNP in the last 8760 hours. HbA1C: No results for input(s): HGBA1C in the last 72 hours. CBG: Recent Labs  Lab 06/05/21 1947 06/05/21 2315 06/06/21 0318 06/06/21 0735 06/06/21 1136  GLUCAP 84 94 78 71 110*    Lipid Profile: No results for input(s): CHOL, HDL, LDLCALC, TRIG, CHOLHDL, LDLDIRECT in the last 72 hours. Thyroid Function Tests: No results for input(s): TSH, T4TOTAL, FREET4, T3FREE, THYROIDAB in the last 72 hours. Anemia Panel: No results for input(s): VITAMINB12, FOLATE, FERRITIN, TIBC, IRON,  RETICCTPCT in the last 72 hours. Sepsis Labs: Recent Labs  Lab 06/04/21 1958  LATICACIDVEN 1.4     Recent Results (from the past 240 hour(s))  Urine Culture     Status: Abnormal   Collection Time: 05/30/21  4:50 AM   Specimen: Urine, Catheterized  Result Value Ref Range Status   Specimen Description URINE, CATHETERIZED  Final   Special Requests   Final    Normal Performed at [facility], [address], [place], [place] [address]    Culture MULTIPLE SPECIES PRESENT, SUGGEST RECOLLECTION (A)  Final   Report Status 05/31/2021 FINAL  Final  Resp Panel by RT-PCR (Flu A&B, Covid) Nasopharyngeal Swab     Status: None   Collection Time: 05/31/21 10:29 AM   Specimen: Nasopharyngeal Swab; Nasopharyngeal(NP) swabs in vial transport medium  Result Value Ref Range Status   SARS Coronavirus 2 by RT PCR NEGATIVE NEGATIVE Final    Comment: (NOTE) SARS-CoV-2 target nucleic acids are NOT DETECTED.  The SARS-CoV-2 RNA is generally detectable in upper respiratory specimens during the acute phase of infection. The lowest concentration of SARS-CoV-2 viral copies this assay can detect is 138 copies/mL. A negative result does not preclude SARS-Cov-2 infection and should not be used as the sole basis for treatment or other patient management decisions. A negative result may occur with  improper specimen collection/handling, submission of specimen other than nasopharyngeal swab, presence of viral mutation(s) within the areas targeted by this assay, and inadequate number of viral copies(<138 copies/mL). A negative result must be combined with clinical observations, patient history, and epidemiological information. The expected result is Negative.  Fact Sheet for Patients:  EntrepreneurPulse.com.au  Fact Sheet for Healthcare Providers:  IncredibleEmployment.be  This test is no t yet approved or cleared by the [place] FDA and  has been authorized  for detection and/or diagnosis of SARS-CoV-2 by FDA under an Emergency Use Authorization (EUA). This EUA  will remain  in effect (meaning this test can be used) for the duration of the COVID-19 declaration under Section 564(b)(1) of the Act, 21 U.S.C.section 360bbb-3(b)(1), unless the authorization is terminated  or revoked sooner.       Influenza A by PCR NEGATIVE NEGATIVE Final   Influenza B by PCR NEGATIVE NEGATIVE Final    Comment: (NOTE) The Xpert Xpress SARS-CoV-2/FLU/RSV plus assay is intended as an aid in the diagnosis of influenza from Nasopharyngeal swab specimens and should not be used as a sole basis for treatment. Nasal washings and aspirates are unacceptable for Xpert Xpress SARS-CoV-2/FLU/RSV testing.  Fact Sheet for Patients: EntrepreneurPulse.com.au  Fact Sheet for Healthcare Providers: IncredibleEmployment.be  This test is not yet approved or cleared by the [place] FDA and has been authorized for detection and/or diagnosis of SARS-CoV-2 by FDA under an Emergency Use Authorization (EUA). This EUA will remain in effect (meaning this test can be used) for the duration of the COVID-19 declaration under Section 564(b)(1) of the Act, 21 U.S.C. section 360bbb-3(b)(1), unless the authorization is terminated or revoked.  Performed at [facility], [place] [address], [place], [place] [address]   Urine Culture     Status: Abnormal (Preliminary result)   Collection Time: 06/04/21  7:30 PM   Specimen: Urine, Catheterized  Result Value Ref Range Status   Specimen Description URINE, CATHETERIZED  Final   Special Requests NONE  Final   Culture (A)  Final    >=100,000 COLONIES/mL ENTEROCOCCUS FAECALIS VANCOMYCIN RESISTANT ENTEROCOCCUS ISOLATED 20,000 COLONIES/mL GRAM NEGATIVE RODS CULTURE REINCUBATED FOR BETTER GROWTH Performed at [facility], [place] [address] [place], [place] [address]    Report Status PENDING   Incomplete   Organism ID, Bacteria ENTEROCOCCUS FAECALIS (A)  Final      Susceptibility   Enterococcus faecalis - MIC*    AMPICILLIN <=2 SENSITIVE Sensitive     NITROFURANTOIN <=16 SENSITIVE Sensitive     VANCOMYCIN >=32 RESISTANT Resistant     LINEZOLID 2 SENSITIVE Sensitive     * >=100,000 COLONIES/mL ENTEROCOCCUS FAECALIS  Blood Culture (Routine X 2)     Status: None (Preliminary result)   Collection Time: 06/04/21  8:02 PM   Specimen: BLOOD  Result Value Ref Range Status   Specimen Description BLOOD BLOOD LEFT FOREARM  Final   Special Requests   Final    BOTTLES DRAWN AEROBIC AND ANAEROBIC Blood Culture adequate volume   Culture   Final    NO GROWTH 2 DAYS Performed at [facility], [place] [address], [place], [place] [address]    Report Status PENDING  Incomplete  Blood Culture (Routine X 2)     Status: None (Preliminary result)   Collection Time: 06/04/21  8:30 PM   Specimen: BLOOD  Result Value Ref Range Status   Specimen Description BLOOD LEFT ANTECUBITAL  Final   Special Requests   Final    BOTTLES DRAWN AEROBIC AND ANAEROBIC Blood Culture adequate volume   Culture   Final    NO GROWTH 2 DAYS Performed at [facility], [place] [address], [place], [place] [address]    Report Status PENDING  Incomplete  MRSA Next Gen by PCR, Nasal     Status: None   Collection Time: 06/05/21  6:37 PM   Specimen: Nasal Mucosa; Nasal Swab  Result Value Ref Range Status   MRSA by PCR Next Gen NOT DETECTED NOT DETECTED Final    Comment: (NOTE) The GeneXpert MRSA  Assay (FDA approved for NASAL specimens only), is one component of a comprehensive MRSA colonization surveillance program. It is not intended to diagnose MRSA infection nor to guide or monitor treatment for MRSA infections. Test performance is not FDA approved in patients less than 47 years old. Performed at [facility], [place] [address] [place], [place] [address]           Radiology Studies: CT HEAD WO  CONTRAST  Result Date: 06/04/2021 CLINICAL DATA:  Seizure today.  Altered mental status. EXAM: CT HEAD WITHOUT CONTRAST TECHNIQUE: Contiguous axial images were obtained from the base of the skull through the vertex without intravenous contrast. RADIATION DOSE REDUCTION: This exam was performed according to the departmental dose-optimization program which includes automated exposure control, adjustment of the mA and/or kV according to patient size and/or use of iterative reconstruction technique. COMPARISON:  05/08/2021 FINDINGS: Brain: No evidence of intracranial hemorrhage, acute infarction, hydrocephalus, extra-axial collection, or mass lesion/mass effect. Mild generalized cerebral atrophy is unchanged in appearance. Vascular:  No hyperdense vessel or other acute findings. Skull: No evidence of fracture or other significant bone abnormality. Sinuses/Orbits:  No acute findings. Other: None. IMPRESSION: No acute intracranial abnormality. Stable mild cerebral atrophy. Electronically Signed   By: Marlaine Hind M.D.   On: 06/04/2021 21:07   DG Chest Port 1 View  Result Date: 06/04/2021 CLINICAL DATA:  Altered mental status. EXAM: PORTABLE CHEST 1 VIEW COMPARISON:  Chest radiograph dated 05/30/2021. FINDINGS: Tracheostomy above the carina. There is diffuse chronic intra coarsening. Bibasilar densities may represent atelectasis. Developing infiltrate is not excluded. No pleural effusion or pneumothorax. The cardiac silhouette is within limits. Median sternotomy wires. No acute osseous pathology. IMPRESSION: Bibasilar atelectasis versus less likely developing infiltrate. Electronically Signed   By: Anner Crete M.D.   On: 06/04/2021 20:39   EEG adult  Result Date: 06/05/2021 Lora Havens, MD     06/05/2021  8:29 AM Patient Name: KAMEREN PARGAS MRN: [id number] Epilepsy Attending: Lora Havens Referring Physician/Provider: Regan Lemming, MD Date: 06/04/2021 Duration: 21.36 mins Patient history:  81 year old male presented with seizure-like activity.  EEG evaluate for seizure. Level of alertness: lethargic AEDs during EEG study: LEV Technical aspects: This EEG study was done with scalp electrodes positioned according to the 10-20 International system of electrode placement. Electrical activity was acquired at a sampling rate of 500Hz  and reviewed with a high frequency filter of 70Hz  and a low frequency filter of 1Hz . EEG data were recorded continuously and digitally stored. Description: EEG showed continuous generalized 3 to 7 Hz theta-delta slowing admixed with 15 to 18 Hz generalized beta activity. Hyperventilation and photic stimulation were not performed.   ABNORMALITY - Continuous slow, generalized IMPRESSION: This study is suggestive of severe diffuse encephalopathy, nonspecific etiology. No seizures or epileptiform discharges were seen throughout the recording. Priyanka Barbra Sarks        Scheduled Meds:  chlorhexidine gluconate (MEDLINE KIT)  15 mL Mouth Rinse BID   Chlorhexidine Gluconate Cloth  6 each Topical Daily   enoxaparin (LOVENOX) injection  40 mg Subcutaneous Q24H   escitalopram  10 mg Per Tube Daily   feeding supplement (PROSource TF)  45 mL Per Tube BID   folic acid  1 mg Per Tube Daily   levalbuterol  1.25 mg Nebulization Q6H   levETIRAcetam  1,000 mg Per Tube BID   levothyroxine  25 mcg Per Tube Q0600   linezolid  600 mg Per Tube Q12H   mouth rinse  15 mL Mouth Rinse 10 times per day   midodrine  10 mg Per Tube TID WC   multivitamin with minerals  1 tablet Per Tube Daily   omega-3 acid ethyl esters  1 g Per Tube Daily   pantoprazole sodium  40 mg Per Tube BID   saccharomyces boulardii  250 mg Per Tube Daily   sodium chloride flush  3 mL Intravenous Q12H   Continuous Infusions:  sodium chloride 10 mL/hr at 06/06/21 1213   feeding supplement (JEVITY 1.5 CAL/FIBER) 1,000 mL (06/06/21 0859)     LOS: 1 day    Time spent: 65mn   Katelynn Heidler C Jaylynn Siefert, DO [facility]  If 7PM-7AM, please contact night-coverage [email or web address]  06/06/2021, 1:09 PM

## 2021-06-07 ENCOUNTER — Inpatient Hospital Stay (HOSPITAL_COMMUNITY): Payer: Medicare Other

## 2021-06-07 DIAGNOSIS — I468 Cardiac arrest due to other underlying condition: Secondary | ICD-10-CM

## 2021-06-07 DIAGNOSIS — J439 Emphysema, unspecified: Secondary | ICD-10-CM | POA: Diagnosis not present

## 2021-06-07 DIAGNOSIS — J9621 Acute and chronic respiratory failure with hypoxia: Secondary | ICD-10-CM | POA: Diagnosis not present

## 2021-06-07 DIAGNOSIS — Z9911 Dependence on respirator [ventilator] status: Secondary | ICD-10-CM | POA: Diagnosis not present

## 2021-06-07 DIAGNOSIS — J989 Respiratory disorder, unspecified: Secondary | ICD-10-CM

## 2021-06-07 DIAGNOSIS — T83511A Infection and inflammatory reaction due to indwelling urethral catheter, initial encounter: Secondary | ICD-10-CM | POA: Diagnosis not present

## 2021-06-07 DIAGNOSIS — N390 Urinary tract infection, site not specified: Secondary | ICD-10-CM | POA: Diagnosis not present

## 2021-06-07 DIAGNOSIS — I469 Cardiac arrest, cause unspecified: Secondary | ICD-10-CM

## 2021-06-07 LAB — GLUCOSE, CAPILLARY
Glucose-Capillary: 108 mg/dL — ABNORMAL HIGH (ref 70–99)
Glucose-Capillary: 138 mg/dL — ABNORMAL HIGH (ref 70–99)
Glucose-Capillary: 127 mg/dL — ABNORMAL HIGH (ref 70–99)
Glucose-Capillary: 100 mg/dL — ABNORMAL HIGH (ref 70–99)
Glucose-Capillary: 120 mg/dL — ABNORMAL HIGH (ref 70–99)
Glucose-Capillary: 103 mg/dL — ABNORMAL HIGH (ref 70–99)

## 2021-06-07 IMAGING — DX DG CHEST 1V PORT
1 series · 1 of 1 positions shown · non-contrast
Comparison: [DATE]

CLINICAL DATA: Hypoxia

EXAM:
PORTABLE CHEST 1 VIEW

[chest ap]
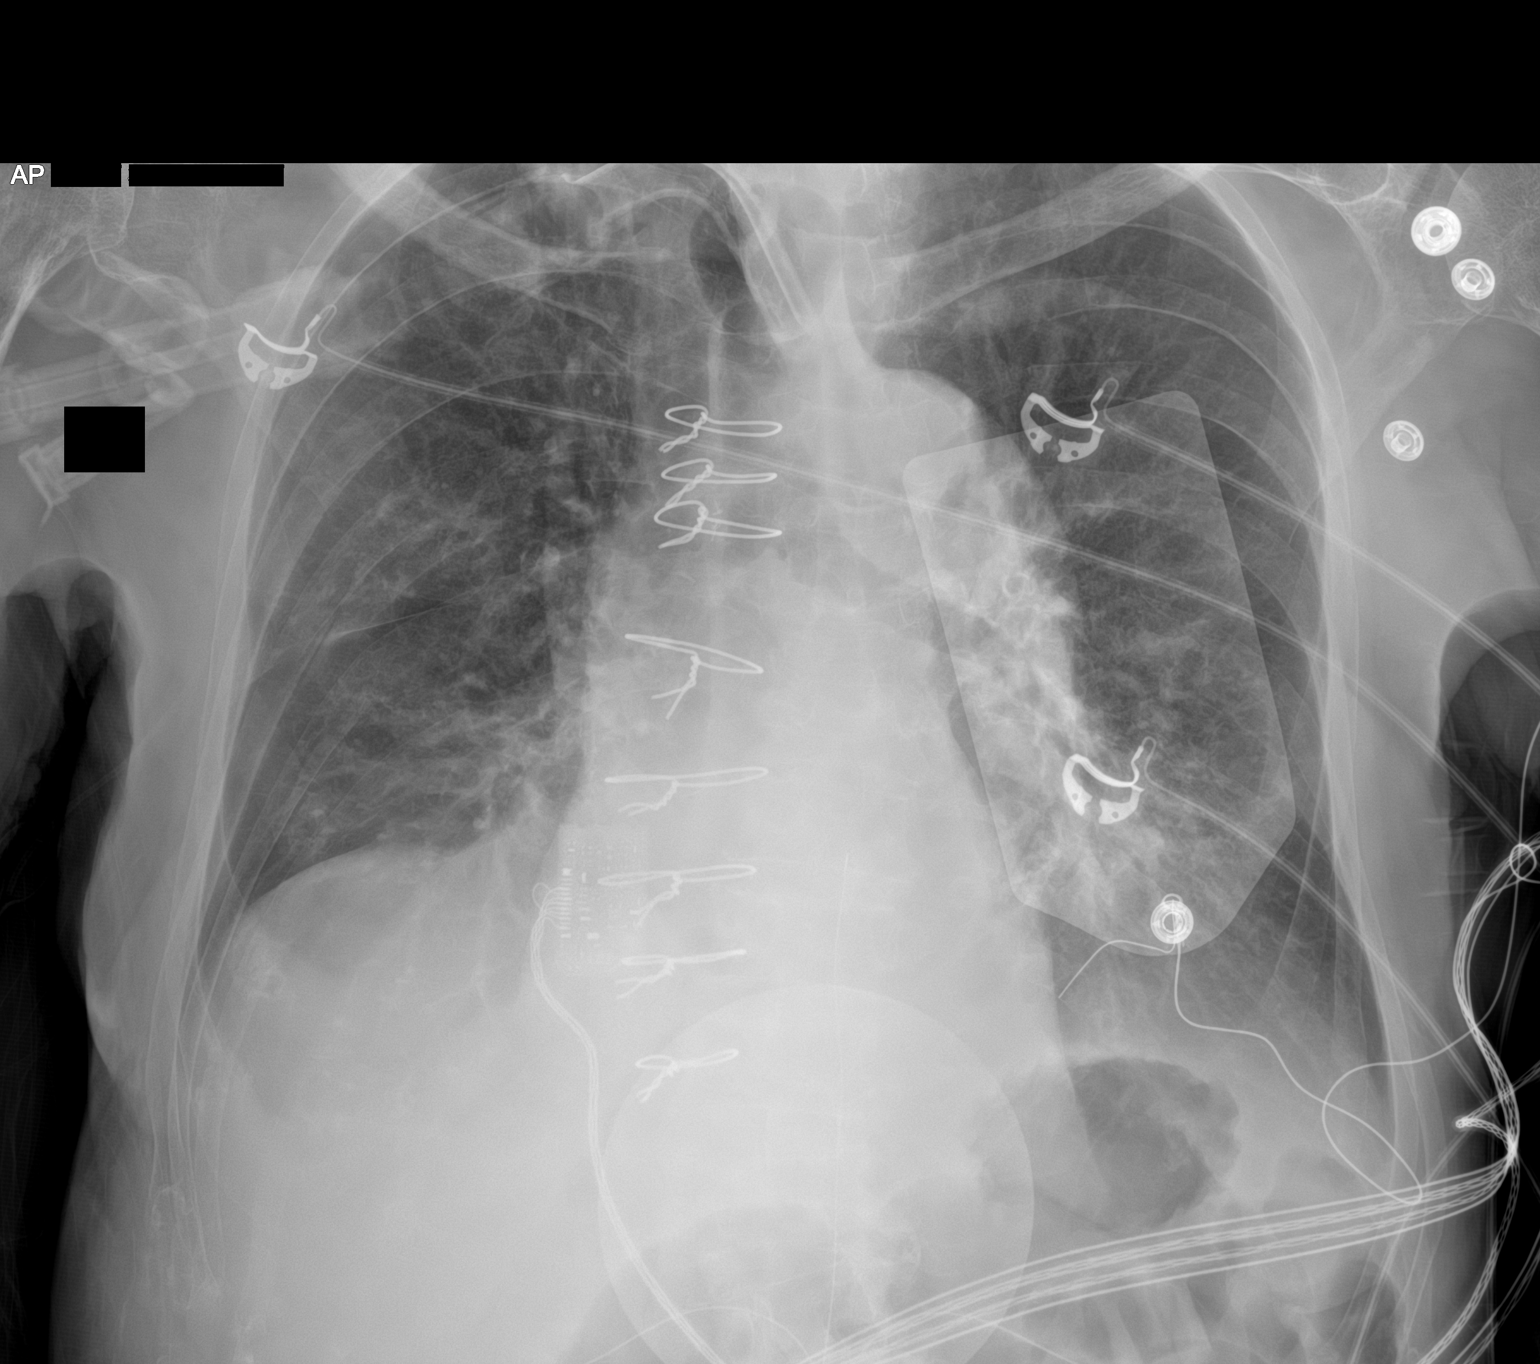

[1 of 1 positions shown; findings below may reference images not displayed]

FINDINGS: Tracheostomy tube is noted in satisfactory position. Cardiac shadow
is stable. Postsurgical changes are again noted. The lungs are well
aerated bilaterally. No focal confluent infiltrate is seen. No bony
abnormality is noted.
IMPRESSION: No acute abnormality noted.

## 2021-06-07 NOTE — Procedures (Signed)
Bronchoscopy Procedure Note  Austin Davis  [id number]  [birth year]  Date:06/07/21  Time:5:09 PM   Provider Performing:Pete E Kary Kos   Procedure(s):  Flexible Bronchoscopy [phone])  Indication(s) Assess trach placement   Consent Unable to obtain consent due to emergent nature of procedure.  Anesthesia NA  Time Out Verified patient identification, verified procedure, site/side was marked, verified correct patient position, special equipment/implants available, medications/allergies/relevant history reviewed, required imaging and test results available.   Sterile Technique Usual hand hygiene, masks, gowns, and gloves were used   Procedure Description Bronchoscope advanced through tracheostomy tube and into airway.  Airways were examined down to subsegmental level with findings noted below.     Findings: tracheostomy tube was sitting in mid trachea, above the carina w/ no evidence of airway obstruction    Complications/Tolerance None; patient tolerated the procedure well. Chest X-ray is not needed post procedure.   EBL Minimal   Specimen(s) NA  Erick Colace ACNP-BC [place] Pager # [phone] OR # [phone] if no answer

## 2021-06-07 NOTE — TOC Progression Note (Addendum)
Transition of Care [facility]) - Initial/Assessment Note    Patient Details  Name: Austin Davis MRN: [id number] Date of Birth: 11-29-1940  Transition of Care [facility]) CM/SW Contact:    Milinda Antis, [place] Phone Number: 06/07/2021, 10:18 AM  Clinical Narrative:                 CSW observed that a d/c order is in for the patient.  CSW contacted [facility] to request transfer information and is awaiting a response.  Pending: d/c summary and transfer info.          Patient Goals and CMS Choice        Expected Discharge Plan and Services           Expected Discharge Date: 06/07/21                                    Prior Living Arrangements/Services                       Activities of Daily Living      Permission Sought/Granted                  Emotional Assessment              Admission diagnosis:  Seizure-like activity ([place]) [R56.9] Urinary tract infection without hematuria, site unspecified [N39.0] Altered mental status, unspecified altered mental status type [M35.36] Acute metabolic encephalopathy [R44.31] Patient Active Problem List   Diagnosis Date Noted   Chronic respiratory failure with hypoxia ([place]) 06/04/2021   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   Impaired quality of life 05/30/2021   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 05/15/2021   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[place]) 05/08/2021   Pneumonia 05/08/2021   Hyperkalemia 05/08/2021   Urinary tract infection associated with indwelling urethral catheter ([place]) 05/08/2021   Encephalopathy 05/08/2021   Pressure injury of skin 04/30/2021   Seizure disorder ([place]) 04/29/2021   Pneumothorax 04/11/2021   Ventilator dependent ([place])    Aspiration pneumonia ([place]) 03/25/2021   Parkinsonism ([place]) 03/25/2021   Obstructive sleep apnea 10/24/2020   Chronic obstructive pulmonary disease ([place]) 10/24/2020   Chronic heart  failure with preserved ejection fraction ([place]) 10/24/2020   Unintentional weight loss 54/00/8676   Acute metabolic encephalopathy 19/50/9326   SVT (supraventricular tachycardia) ([place]) 09/20/2020   Protein-calorie malnutrition, severe 09/17/2020   Acute on chronic respiratory failure with hypoxia and hypercapnia ([place]) 09/16/2020   Failure to thrive in adult 09/16/2020   AF (paroxysmal atrial fibrillation) ([place]) 09/16/2020   Stage 1 skin ulcer of sacral region ([place]) 09/16/2020   Chronic hypotension 06/30/2020   History of GI bleed 02/17/2017   Rectal bleeding 02/16/2017   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 [place] [address] Phone: [phone] Fax: [phone]     Social Determinants of Health (SDOH) Interventions    Readmission Risk Interventions     View : No data to display.

## 2021-06-07 NOTE — TOC Transition Note (Signed)
Transition of Care [facility]) - CM/SW Discharge Note   Patient Details  Name: Austin Davis MRN: [id number] Date of Birth: [birth year]  [facility] CM/SW Contact:  Milinda Antis, [place] Phone Number: 06/07/2021, 12:23 PM   Clinical Narrative:    Patient will DC to:  [facility] Anticipated DC date:  06/07/2021 Family notified: Yes Transport by: Karen Chafe   Per MD patient ready for DC to SNF. RN to call report prior to discharge ([phone]) room 303B and his doctor is Dr. Abelardo Diesel.   RN, patient's family, and facility notified of DC. Discharge Summary sent to facility.  RN will coordinate with [facility] to transport patient.   CSW will sign off for now as social work intervention is no longer needed. Please consult [place] again if new needs arise.     Final next level of care: Skilled Nursing Facility Barriers to Discharge: Barriers Resolved   Patient Goals and CMS Choice Patient states their goals for this hospitalization and ongoing recovery are:: Patient intubated CMS Medicare.gov Compare Post Acute Care list provided to:: Patient Represenative (must comment) Choice offered to / list presented to : Adult Children  Discharge Placement              Patient chooses bed at:  ([facility]) Patient to be transferred to facility by: [place] Name of family member notified: [name] Patient and family notified of of transfer: 06/07/21  Discharge Plan and Services                                     Social Determinants of Health (SDOH) Interventions     Readmission Risk Interventions     View : No data to display.

## 2021-06-07 NOTE — Progress Notes (Signed)
Received a call from bedside RN regarding signing a Most Form for the patient that was brought in by his daughter.  The patient is planned for discharge tomorrow 06/08/21.  Advised bedside RN to keep the form in the patient's chart.  The patient's day shift attending MD will sign the form in the AM.  Night coverage is available at anytime for this patient and will happily continue to assist with the care of the patient.

## 2021-06-07 NOTE — Progress Notes (Signed)
  PROGRESS NOTE    Austin Davis  RKY:[id number] DOB: January 14, 1941 DOA: 06/04/2021 PCP: Aaron Edelman, MD   Brief Narrative:   Patient had transient loss of pulse at time of discharge while being prepped/moved to [place]. Nurse noted grey discoloration; presume hypoxic arrest given current trach dependent status that recovered with manual respiratory bagging *(NO CPR WAS DONE IN ACCORDANCE WITH PATIENT'S LIVING WILL/MOST FORM).  Will ask PCCM to evaluate trach to ensure safe location/condition prior to transfer back to facility.   [name], DO [facility]  If 7PM-7AM, please contact night-coverage [email or web address]  06/07/2021, 4:19 PM

## 2021-06-07 NOTE — Progress Notes (Signed)
IVT responded to code blue.  Upon arrival, advised no lines present.  IVT placed 22g, LLA.

## 2021-06-07 NOTE — Progress Notes (Signed)
PCCM called to bedside to help fill out most form. Patient was limited code not wanting compressions. Now patient is stating he wants everything and would like to be full code. Most form filled out and changed to full code.  JD Rexene Agent [facility] 06/07/2021, 8:15 PM  Please see [email or web address] for pager details.  From 7A-7P if no response, please call [phone]. After hours, please call [facility] [phone].

## 2021-06-07 NOTE — Discharge Summary (Addendum)
Physician Discharge Summary  Austin Davis YJE:[id number] DOB: Mar 01, 1940 DOA: 06/04/2021  PCP: Aaron Edelman, MD  Admit date: 06/04/2021 Discharge date: 06/07/2021  Admitted From: [facility] Disposition: Same  Recommendations for Outpatient Follow-up:  Follow up with PCP, neurology as scheduled  Discharge Condition: Guarded CODE STATUS: Partial, no compressions Diet recommendation: PEG tube feeds as below  Brief/Interim Summary: Austin Davis is a 81 y.o. 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  Unclear if patient had transient episodic seizure episode versus worsening UTI, EEG was unremarkable at intake, no medication changes from a neurology standpoint.  Patient's urinalysis and urine culture were remarkable, appears to be colonized with VRE with more than 100,000 colonies concerning for acute UTI despite recent antibiotics in the outpatient setting.  Given resistances patient was placed on linezolid and has now completed 3-day course.  Foley was exchanged in intake and otherwise is back to baseline.  Lengthy discussion with patient and daughter about keeping his Foley catheter clean as well as to avoid future infections as well as to monitor symptoms more closely in hopes to catch UTI early to avoid any possible disseminated infection.  He remains high risk for readmission given his age chronic comorbid conditions and bedbound status.  As patient was getting prepped for discharge there was an event - likely positional trach became loose/dislodged given his reported  gray appearance by nursing staff. Patient had weak/nonpalpable pulse during the time period (but was no longer on telemetry due to pending transfer) - he was bagged successfully with return of pulse and color. Transfer will be discontinued for evaluation by PCCM of trach to ensure good positioning/location.  Discharge Diagnoses:  Principal Problem:   Urinary tract infection associated with indwelling urethral catheter (HCC) Active Problems:   Acute metabolic encephalopathy   Ventilator dependent (HCC)   Chronic respiratory failure with hypoxia (HCC)   Paroxysmal atrial fibrillation (HCC)   Seizure disorder (HCC)   Chronic hypotension   Chronic obstructive pulmonary disease (HCC)   Parkinsonism ([place])   Hypothyroidism   Gastrostomy tube dependent ([place])   Depression with anxiety    Discharge Instructions  Discharge Instructions     Discharge patient   Complete by: As directed    [facility]   Discharge disposition: [place] Not Defined   Discharge patient date: 06/07/2021      Allergies as of 06/07/2021       Reactions   Prednisone Other (See Comments)   Makes "skin crawl," rapid HR- "Allergic," per [facility]   Cortisone Palpitations, Other (See Comments)   "Allergic," per [facility]        Medication List     STOP taking these medications    sulfamethoxazole-trimethoprim 800-160 MG tablet Commonly known as: BACTRIM DS       TAKE these medications    acetaminophen 650 MG CR tablet Commonly known as: TYLENOL 650 mg See admin instructions. 650 mg, per tube, every 8 hours as needed for moderate pain   ALPRAZolam 0.5 MG tablet Commonly known as: XANAX Place 1 tablet (0.5 mg total) into feeding tube every 8 (eight) hours as needed for anxiety.   CULTURELLE PROBIOTICS  PO Give 1 capsule by tube daily. 10 billion cell   escitalopram 10 MG tablet Commonly known as: LEXAPRO Place 10 mg into feeding tube daily.   esomeprazole 40 MG packet Commonly  known as: NEXIUM 40 mg See admin instructions. 40 mg, per tube, every morning   feeding supplement (JEVITY 1.5 CAL/FIBER) Liqd Place 55 mL/hr into feeding tube continuous. What changed: Another medication with the same name was changed. Make sure you understand how and when to take each.   feeding supplement (JEVITY 1.5 CAL/FIBER) Liqd Place 1,000 mLs into feeding tube continuous. What changed: how much to take   fexofenadine 180 MG tablet Commonly known as: ALLEGRA Place 180 mg into feeding tube daily.   fluticasone 50 MCG/ACT nasal spray Commonly known as: FLONASE Place 2 sprays into both nostrils in the morning and at bedtime.   folic acid 1 MG tablet Commonly known as: FOLVITE Take 1 tablet (1 mg total) by mouth daily. What changed: how to take this   furosemide 20 MG tablet Commonly known as: LASIX Place 1 tablet (20 mg total) into feeding tube daily as needed for fluid or edema.   Geri-Tussin 100 MG/5ML liquid Generic drug: guaiFENesin Place 400 mg into feeding tube 3 (three) times daily.   hydrOXYzine 25 MG tablet Commonly known as: ATARAX Place 25 mg into feeding tube every 6 (six) hours as needed for anxiety.   lactulose 10 GM/15ML solution Commonly known as: CHRONULAC Place 20 g into feeding tube 2 (two) times daily as needed for mild constipation.   levalbuterol 1.25 MG/3ML nebulizer solution Commonly known as: XOPENEX Take 1.25 mg by nebulization every 6 (six) hours.   levETIRAcetam 100 MG/ML solution Commonly known as: KEPPRA Place 1,000 mg into feeding tube in the morning and at bedtime.   levothyroxine 25 MCG tablet Commonly known as: SYNTHROID Place 25 mcg into feeding tube daily before breakfast.   midodrine 10 MG tablet Commonly known as: PROAMATINE Place 1 tablet (10 mg total) into feeding tube 3 (three) times daily with meals.   multivitamin with minerals Tabs tablet Place 1 tablet into feeding tube daily.   ondansetron 4 MG  disintegrating tablet Commonly known as: ZOFRAN-ODT 4 mg See admin instructions. 4 mg, per tube, every six hours as needed for nausea with vomiting   ondansetron 4 MG tablet Commonly known as: ZOFRAN Take 1 tablet (4 mg total) by mouth every 6 (six) hours as needed for nausea.   polyethylene glycol powder 17 GM/SCOOP powder Commonly known as: GLYCOLAX/MIRALAX Place 17 g into feeding tube See admin instructions. 17 grams, per tube, every 2 days   propranolol 10 MG tablet Commonly known as: INDERAL Place 1 tablet (10 mg total) into feeding tube daily as needed (for tremors in AM. do not administer if heart rate is below 60 or systolic blood pressure is below 90.). What changed:  when to take this additional instructions   sodium chloride 0.65 % Soln nasal spray Commonly known as: OCEAN Place 1 spray into both nostrils every 4 (four) hours as needed (for allergic rhinitis).   Super Omega 3 EPA/DHA 1000 MG Caps Give 1,000 mg by tube daily.   tamsulosin 0.4 MG Caps capsule Commonly known as: FLOMAX 0.4 mg See admin instructions. 0.4 mg per tube at bedtime   vitamin C 500 MG tablet Commonly known as: ASCORBIC ACID Place 500 mg into feeding tube daily.   zinc gluconate 50 MG tablet Place 50 mg into feeding tube 2 (two) times  daily.        Allergies  Allergen Reactions   Prednisone Other (See Comments)    Makes "skin crawl," rapid HR- "Allergic," per [facility]   Cortisone Palpitations and Other (See Comments)    "Allergic," per [facility]    Consultations: None  Procedures/Studies: CT HEAD WO CONTRAST  Result Date: 06/04/2021 CLINICAL DATA:  Seizure today.  Altered mental status. EXAM: CT HEAD WITHOUT CONTRAST TECHNIQUE: Contiguous axial images were obtained from the base of the skull through the vertex without intravenous contrast. RADIATION DOSE REDUCTION: This exam was performed according to the departmental dose-optimization program which includes automated exposure control,  adjustment of the mA and/or kV according to patient size and/or use of iterative reconstruction technique. COMPARISON:  05/08/2021 FINDINGS: Brain: No evidence of intracranial hemorrhage, acute infarction, hydrocephalus, extra-axial collection, or mass lesion/mass effect. Mild generalized cerebral atrophy is unchanged in appearance. Vascular:  No hyperdense vessel or other acute findings. Skull: No evidence of fracture or other significant bone abnormality. Sinuses/Orbits:  No acute findings. Other: None. IMPRESSION: No acute intracranial abnormality. Stable mild cerebral atrophy. Electronically Signed   By: Marlaine Hind M.D.   On: 06/04/2021 21:07   CT Head Wo Contrast  Result Date: 05/08/2021 CLINICAL DATA:  Mental status change, recurrent seizures EXAM: CT HEAD WITHOUT CONTRAST TECHNIQUE: Contiguous axial images were obtained from the base of the skull through the vertex without intravenous contrast. RADIATION DOSE REDUCTION: This exam was performed according to the departmental dose-optimization program which includes automated exposure control, adjustment of the mA and/or kV according to patient size and/or use of iterative reconstruction technique. COMPARISON:  04/28/2021 FINDINGS: Brain: No evidence of acute infarction, hemorrhage, extra-axial collection, ventriculomegaly, or mass effect. Generalized cerebral atrophy. Periventricular white matter low attenuation likely secondary to microangiopathy. Vascular: Cerebrovascular atherosclerotic calcifications are noted. Skull: Negative for fracture or focal lesion. Sinuses/Orbits: Visualized portions of the orbits are unremarkable. Visualized portions of the paranasal sinuses are unremarkable. Visualized portions of the mastoid air cells are unremarkable. Other: None. IMPRESSION: 1. No acute intracranial findings. 2. Chronic small vessel ischemic changes. Electronically Signed   By: Kathreen Devoid M.D.   On: 05/08/2021 13:51   CT Angio Chest PE W and/or Wo  Contrast  Result Date: 05/08/2021 CLINICAL DATA:  Seizure, diaphoresis. Evaluate for pulmonary embolus. EXAM: CT ANGIOGRAPHY CHEST WITH CONTRAST TECHNIQUE: Multidetector CT imaging of the chest was performed using the standard protocol during bolus administration of intravenous contrast. Multiplanar CT image reconstructions and MIPs were obtained to evaluate the vascular anatomy. RADIATION DOSE REDUCTION: This exam was performed according to the departmental dose-optimization program which includes automated exposure control, adjustment of the mA and/or kV according to patient size and/or use of iterative reconstruction technique. CONTRAST:  29mL OMNIPAQUE IOHEXOL 350 MG/ML SOLN COMPARISON:  03/25/2021, 10/25/2020, 09/18/2020. FINDINGS: Cardiovascular: Negative for pulmonary embolus. Atherosclerotic calcification of the aorta, aortic valve and coronary arteries. Heart size normal. Left ventricle appears hypertrophied. No pericardial effusion. Mediastinum/Nodes: No pathologically enlarged mediastinal, hilar or axillary lymph nodes. Esophagus is grossly unremarkable. Lungs/Pleura: Tracheostomy is in place. Debris is seen in the lower trachea. Biapical pleuroparenchymal scarring. Spiculated left lower lobe nodule has tags to the adjacent pleura, measuring 8 x 12 mm (8/96) and is stable from 03/25/2021 but new from 09/18/2020. Peribronchovascular ground-glass nodularity and nodular consolidation in the right lower lobe. Minimal peribronchovascular ground-glass nodularity in the left lower lobe. Dependent volume loss in the lower lobes bilaterally. Trace bilateral pleural effusions. Upper Abdomen: Visualized portions of the liver,  adrenal glands, right kidney, spleen, pancreas, stomach and bowel are grossly unremarkable. Musculoskeletal: Degenerative changes in the spine. Old thoracolumbar compression deformities. No worrisome lytic or sclerotic lesions. Review of the MIP images confirms the above findings. IMPRESSION:  1. Negative for pulmonary embolus. 2. Spiculated left lower lobe nodule, stable from 03/25/2021 but new from 09/18/2020. Finding is worrisome for primary bronchogenic carcinoma. Outpatient consultation with pulmonary medicine or thoracic surgery is recommended. 3. Bilateral lower lobe airspace opacification, right much greater than left, worrisome for chronic/repeated aspiration. Pneumonia is not excluded. 4. Trace bilateral pleural effusions. 5. Aortic atherosclerosis (ICD10-I70.0). Coronary artery calcification. Electronically Signed   By: Lorin Picket M.D.   On: 05/08/2021 13:58   DG Chest Port 1 View  Result Date: 06/04/2021 CLINICAL DATA:  Altered mental status. EXAM: PORTABLE CHEST 1 VIEW COMPARISON:  Chest radiograph dated 05/30/2021. FINDINGS: Tracheostomy above the carina. There is diffuse chronic intra coarsening. Bibasilar densities may represent atelectasis. Developing infiltrate is not excluded. No pleural effusion or pneumothorax. The cardiac silhouette is within limits. Median sternotomy wires. No acute osseous pathology. IMPRESSION: Bibasilar atelectasis versus less likely developing infiltrate. Electronically Signed   By: Anner Crete M.D.   On: 06/04/2021 20:39   DG Chest Port 1 View  Result Date: 05/30/2021 CLINICAL DATA:  Seizures EXAM: PORTABLE CHEST 1 VIEW COMPARISON:  05/10/2021 FINDINGS: Prior CABG. Heart is normal size. Tracheostomy again noted. Left lower lobe opacity. Right lung clear. No effusions or acute bony abnormality. IMPRESSION: Left lower lobe opacity.  Cannot exclude pneumonia. Electronically Signed   By: Rolm Baptise M.D.   On: 05/30/2021 00:20   DG CHEST PORT 1 VIEW  Result Date: 05/10/2021 CLINICAL DATA:  Encounter for shortness of breath. EXAM: PORTABLE CHEST 1 VIEW COMPARISON:  CTA chest and portable chest both 05/08/2021. FINDINGS: 4:34 a.m., 05/10/2021. Tracheostomy cannula tip remains 3.6 cm from the carina. There is overlying monitor wiring. Intact  median sternotomy sutures. Stable mediastinum with aortic atherosclerosis, tortuosity and ectasia. The cardiac size is normal. Bilateral basal airspace disease is probably not significantly changed but not well evaluated with portable chest radiograph atrophy because the majority of was behind the hemidiaphragms, with asymmetric elevation of the right diaphragm. 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 Left lower lobe spiculated nodule noted on CT is not visible on this x-ray. IMPRESSION: Right-greater-than-left bibasilar airspace disease is probably unchanged but not well evaluated on portable chest radiography due to its location. Known spiculated nodule in the left lower lobe is not visible either. No other evidence of acute chest process. Electronically Signed   By: Telford Nab M.D.   On: 05/10/2021 06:42   EEG adult  Result Date: 06/05/2021 Lora Havens, MD     06/05/2021  8:29 AM Patient Name: Austin Davis MRN: [id number] Epilepsy Attending: Lora Havens Referring Physician/Provider: Regan Lemming, MD Date: 06/04/2021 Duration: 21.36 mins Patient history: 81 year old male presented with seizure-like activity.  EEG evaluate for seizure. Level of alertness: lethargic AEDs during EEG study: LEV Technical aspects: This EEG study was done with scalp electrodes positioned according to the 10-20 International system of electrode placement. Electrical activity was acquired at a sampling rate of 500Hz  and reviewed with a high frequency filter of 70Hz  and a low frequency filter of 1Hz . EEG data were recorded continuously and digitally stored. Description: EEG showed continuous generalized 3 to 7 Hz theta-delta slowing admixed with 15 to 18 Hz generalized beta activity. Hyperventilation and photic stimulation were not performed.  ABNORMALITY - Continuous slow, generalized IMPRESSION: This study is suggestive of severe diffuse encephalopathy, nonspecific etiology. No seizures or epileptiform  discharges were seen throughout the recording. Lora Havens   EEG adult  Result Date: 05/30/2021 Lora Havens, MD     05/30/2021 10:45 AM Patient Name: Austin Davis MRN: [id number] Epilepsy Attending: Lora Havens Referring Physician/Provider: Donnetta Simpers, MD Date: 05/30/2021 Duration: 23 mins Patient history: 81 y.o. male with PMH significant for remor, paroxysmal atrial fibrillation not on anticoagulation, presumed seizures on Keppra 750mg  BID who presents from his LTAC with atleast a 40 mins episode concerning for prolonged seizures x 2.  EEG to evaluate for seizure. Level of alertness: Awake AEDs during EEG study: LEV Technical aspects: This EEG study was done with scalp electrodes positioned according to the 10-20 International system of electrode placement. Electrical activity was acquired at a sampling rate of 500Hz  and reviewed with a high frequency filter of 70Hz  and a low frequency filter of 1Hz . EEG data were recorded continuously and digitally stored. Description: The posterior dominant rhythm consists of 8 Hz activity of moderate voltage (25-35 uV) seen predominantly in posterior head regions, symmetric and reactive to eye opening and eye closing. Hyperventilation and photic stimulation were not performed.   Patient was noted to have near continuous mouth tremor throughout the study. Concomitant EEG before, during and after the event did not show any EEG change to suggest seizure.  IMPRESSION: This study is within normal limits. No seizures or epileptiform discharges were seen throughout the recording.  Patient was noted to have mouth tremor throughout the study without concomitant EEG change.  These episodes were nonepileptic.  Priyanka Barbra Sarks     Subjective: No acute issues or events overnight   Discharge Exam: Vitals:   06/07/21 0900 06/07/21 1126  BP: 115/70   Pulse: 74   Resp: 19   Temp:  98.3 F (36.8 C)  SpO2: 98%    Vitals:   06/07/21 0800 06/07/21 0830  06/07/21 0900 06/07/21 1126  BP: (!) 111/98  115/70   Pulse: 84 86 74   Resp: 17 (!) 25 19   Temp:    98.3 F (36.8 C)  TempSrc:    Oral  SpO2: 100% 99% 98%   Weight:      Height:        General:  Pleasantly resting in bed, No acute distress. HEENT:  Trach clean dry and intact. Neck:  Without mass or deformity.  Trachea is midline. Lungs:  Clear to auscultate bilaterally without rhonchi, wheeze, or rales. Heart:  Regular rate and rhythm.  Without murmurs, rubs, or gallops. Abdomen:  Soft, nontender, nondistended.  Without guarding or rebound. Peg/Foley clean dry intact. Extremities: Without cyanosis, clubbing, edema, or obvious deformity. Vascular:  Dorsalis pedis and posterior tibial pulses palpable bilaterally. Skin:  Warm and dry, no ecchymoses   The results of significant diagnostics from this hospitalization (including imaging, microbiology, ancillary and laboratory) are listed below for reference.     Microbiology: Recent Results (from the past 240 hour(s))  Urine Culture     Status: Abnormal   Collection Time: 05/30/21  4:50 AM   Specimen: Urine, Catheterized  Result Value Ref Range Status   Specimen Description URINE, CATHETERIZED  Final   Special Requests   Final    Normal Performed at [facility], [address], [place], [place] [address]    Culture MULTIPLE SPECIES PRESENT, SUGGEST RECOLLECTION (A)  Final   Report  Status 05/31/2021 FINAL  Final  Resp Panel by RT-PCR (Flu A&B, Covid) Nasopharyngeal Swab     Status: None   Collection Time: 05/31/21 10:29 AM   Specimen: Nasopharyngeal Swab; Nasopharyngeal(NP) swabs in vial transport medium  Result Value Ref Range Status   SARS Coronavirus 2 by RT PCR NEGATIVE NEGATIVE Final    Comment: (NOTE) SARS-CoV-2 target nucleic acids are NOT DETECTED.  The SARS-CoV-2 RNA is generally detectable in upper respiratory specimens during the acute phase of infection. The lowest concentration of SARS-CoV-2 viral copies  this assay can detect is 138 copies/mL. A negative result does not preclude SARS-Cov-2 infection and should not be used as the sole basis for treatment or other patient management decisions. A negative result may occur with  improper specimen collection/handling, submission of specimen other than nasopharyngeal swab, presence of viral mutation(s) within the areas targeted by this assay, and inadequate number of viral copies(<138 copies/mL). A negative result must be combined with clinical observations, patient history, and epidemiological information. The expected result is Negative.  Fact Sheet for Patients:  EntrepreneurPulse.com.au  Fact Sheet for Healthcare Providers:  IncredibleEmployment.be  This test is no t yet approved or cleared by the [place] FDA and  has been authorized for detection and/or diagnosis of SARS-CoV-2 by FDA under an Emergency Use Authorization (EUA). This EUA will remain  in effect (meaning this test can be used) for the duration of the COVID-19 declaration under Section 564(b)(1) of the Act, 21 U.S.C.section 360bbb-3(b)(1), unless the authorization is terminated  or revoked sooner.       Influenza A by PCR NEGATIVE NEGATIVE Final   Influenza B by PCR NEGATIVE NEGATIVE Final    Comment: (NOTE) The Xpert Xpress SARS-CoV-2/FLU/RSV plus assay is intended as an aid in the diagnosis of influenza from Nasopharyngeal swab specimens and should not be used as a sole basis for treatment. Nasal washings and aspirates are unacceptable for Xpert Xpress SARS-CoV-2/FLU/RSV testing.  Fact Sheet for Patients: EntrepreneurPulse.com.au  Fact Sheet for Healthcare Providers: IncredibleEmployment.be  This test is not yet approved or cleared by the [place] FDA and has been authorized for detection and/or diagnosis of SARS-CoV-2 by FDA under an Emergency Use Authorization (EUA). This EUA  will remain in effect (meaning this test can be used) for the duration of the COVID-19 declaration under Section 564(b)(1) of the Act, 21 U.S.C. section 360bbb-3(b)(1), unless the authorization is terminated or revoked.  Performed at [facility], [place] [address] [place], [place] [address]   Urine Culture     Status: Abnormal (Preliminary result)   Collection Time: 06/04/21  7:30 PM   Specimen: Urine, Catheterized  Result Value Ref Range Status   Specimen Description URINE, CATHETERIZED  Final   Special Requests NONE  Final   Culture (A)  Final    >=100,000 COLONIES/mL ENTEROCOCCUS FAECALIS VANCOMYCIN RESISTANT ENTEROCOCCUS ISOLATED 20,000 COLONIES/mL GRAM NEGATIVE RODS IDENTIFICATION AND SUSCEPTIBILITIES TO FOLLOW Performed at [facility], [place] [address] [place], [place] [address]    Report Status PENDING  Incomplete   Organism ID, Bacteria ENTEROCOCCUS FAECALIS (A)  Final      Susceptibility   Enterococcus faecalis - MIC*    AMPICILLIN <=2 SENSITIVE Sensitive     NITROFURANTOIN <=16 SENSITIVE Sensitive     VANCOMYCIN >=32 RESISTANT Resistant     LINEZOLID 2 SENSITIVE Sensitive     * >=100,000 COLONIES/mL ENTEROCOCCUS FAECALIS  Blood Culture (Routine X 2)     Status: None (Preliminary result)  Collection Time: 06/04/21  8:02 PM   Specimen: BLOOD  Result Value Ref Range Status   Specimen Description BLOOD BLOOD LEFT FOREARM  Final   Special Requests   Final    BOTTLES DRAWN AEROBIC AND ANAEROBIC Blood Culture adequate volume   Culture   Final    NO GROWTH 3 DAYS Performed at [facility], [address], [place], [place] [address]    Report Status PENDING  Incomplete  Blood Culture (Routine X 2)     Status: None (Preliminary result)   Collection Time: 06/04/21  8:30 PM   Specimen: BLOOD  Result Value Ref Range Status   Specimen Description BLOOD LEFT ANTECUBITAL  Final   Special Requests   Final    BOTTLES DRAWN AEROBIC AND ANAEROBIC Blood Culture  adequate volume   Culture   Final    NO GROWTH 3 DAYS Performed at [facility], [place] [address], [place], [place] [address]    Report Status PENDING  Incomplete  MRSA Next Gen by PCR, Nasal     Status: None   Collection Time: 06/05/21  6:37 PM   Specimen: Nasal Mucosa; Nasal Swab  Result Value Ref Range Status   MRSA by PCR Next Gen NOT DETECTED NOT DETECTED Final    Comment: (NOTE) The GeneXpert MRSA Assay (FDA approved for NASAL specimens only), is one component of a comprehensive MRSA colonization surveillance program. It is not intended to diagnose MRSA infection nor to guide or monitor treatment for MRSA infections. Test performance is not FDA approved in patients less than 36 years old. Performed at [facility], [place] [address] [place], [place] [address]      Labs: BNP (last 3 results) Recent Labs    04/30/21 0030 05/01/21 0617 05/08/21 1145  BNP 134.2* 193.0* 546.5*   Basic Metabolic Panel: Recent Labs  Lab 06/04/21 2002 06/04/21 2010 06/04/21 2104 06/04/21 2106 06/05/21 0401 06/05/21 1153 06/05/21 1710 06/06/21 0614 06/06/21 1631  NA 135 135 135 135 134*  --   --   --   --   K 4.9 5.0 4.8 4.8 4.2  --   --   --   --   CL  --  105  --  103 105  --   --   --   --   CO2  --  22  --   --  23  --   --   --   --   GLUCOSE  --  130*  --  128* 100*  --   --   --   --   BUN  --  14  --  14 13  --   --   --   --   CREATININE  --  0.69  --  0.60* 0.52*  --   --   --   --   CALCIUM  --  9.2  --   --  8.8*  --   --   --   --   MG  --   --   --   --  2.1 2.1 1.9 1.9 2.0  PHOS  --   --   --   --   --  2.9 3.0 3.1 3.5   Liver Function Tests: Recent Labs  Lab 06/04/21 2010  AST 19  ALT 31  ALKPHOS 100  BILITOT 0.4  PROT 6.6  ALBUMIN 3.0*   No results for input(s): LIPASE, AMYLASE in the  last 168 hours. Recent Labs  Lab 06/04/21 2010  AMMONIA 34   CBC: Recent Labs  Lab 06/04/21 2002 06/04/21 2010 06/04/21 2104 06/04/21 2106 06/05/21 0401   WBC  --  15.5*  --   --  8.7  NEUTROABS  --  13.8*  --   --   --   HGB 10.5* 10.4* 10.5* 10.5* 9.3*  HCT 31.0* 31.3* 31.0* 31.0* 28.0*  MCV  --  88.4  --   --  88.1  PLT  --  246  --   --  216   Cardiac Enzymes: No results for input(s): CKTOTAL, CKMB, CKMBINDEX, TROPONINI in the last 168 hours. BNP: Invalid input(s): POCBNP CBG: Recent Labs  Lab 06/06/21 1956 06/06/21 2327 06/07/21 0354 06/07/21 0704 06/07/21 1124  GLUCAP 116* 112* 108* 138* 127*   D-Dimer No results for input(s): DDIMER in the last 72 hours. Hgb A1c No results for input(s): HGBA1C in the last 72 hours. Lipid Profile No results for input(s): CHOL, HDL, LDLCALC, TRIG, CHOLHDL, LDLDIRECT in the last 72 hours. Thyroid function studies No results for input(s): TSH, T4TOTAL, T3FREE, THYROIDAB in the last 72 hours.  Invalid input(s): FREET3 Anemia work up No results for input(s): VITAMINB12, FOLATE, FERRITIN, TIBC, IRON, RETICCTPCT in the last 72 hours. Urinalysis    Component Value Date/Time   COLORURINE AMBER (A) 06/04/2021 1957   APPEARANCEUR CLOUDY (A) 06/04/2021 1957   LABSPEC 1.019 06/04/2021 1957   PHURINE 6.0 06/04/2021 1957   GLUCOSEU NEGATIVE 06/04/2021 1957   HGBUR NEGATIVE 06/04/2021 1957   BILIRUBINUR NEGATIVE 06/04/2021 1957   KETONESUR NEGATIVE 06/04/2021 1957   PROTEINUR 30 (A) 06/04/2021 1957   UROBILINOGEN 0.2 09/25/2006 1500   NITRITE NEGATIVE 06/04/2021 1957   LEUKOCYTESUR LARGE (A) 06/04/2021 1957   Sepsis Labs Invalid input(s): PROCALCITONIN,  WBC,  LACTICIDVEN Microbiology Recent Results (from the past 240 hour(s))  Urine Culture     Status: Abnormal   Collection Time: 05/30/21  4:50 AM   Specimen: Urine, Catheterized  Result Value Ref Range Status   Specimen Description URINE, CATHETERIZED  Final   Special Requests   Final    Normal Performed at [facility], [address], [place], [place] [address]    Culture MULTIPLE SPECIES PRESENT, SUGGEST RECOLLECTION (A)   Final   Report Status 05/31/2021 FINAL  Final  Resp Panel by RT-PCR (Flu A&B, Covid) Nasopharyngeal Swab     Status: None   Collection Time: 05/31/21 10:29 AM   Specimen: Nasopharyngeal Swab; Nasopharyngeal(NP) swabs in vial transport medium  Result Value Ref Range Status   SARS Coronavirus 2 by RT PCR NEGATIVE NEGATIVE Final    Comment: (NOTE) SARS-CoV-2 target nucleic acids are NOT DETECTED.  The SARS-CoV-2 RNA is generally detectable in upper respiratory specimens during the acute phase of infection. The lowest concentration of SARS-CoV-2 viral copies this assay can detect is 138 copies/mL. A negative result does not preclude SARS-Cov-2 infection and should not be used as the sole basis for treatment or other patient management decisions. A negative result may occur with  improper specimen collection/handling, submission of specimen other than nasopharyngeal swab, presence of viral mutation(s) within the areas targeted by this assay, and inadequate number of viral copies(<138 copies/mL). A negative result must be combined with clinical observations, patient history, and epidemiological information. The expected result is Negative.  Fact Sheet for Patients:  EntrepreneurPulse.com.au  Fact Sheet for Healthcare Providers:  IncredibleEmployment.be  This test is no t yet approved or  cleared by the [place] and  has been authorized for detection and/or diagnosis of SARS-CoV-2 by FDA under an Emergency Use Authorization (EUA). This EUA will remain  in effect (meaning this test can be used) for the duration of the COVID-19 declaration under Section 564(b)(1) of the Act, 21 U.S.C.section 360bbb-3(b)(1), unless the authorization is terminated  or revoked sooner.       Influenza A by PCR NEGATIVE NEGATIVE Final   Influenza B by PCR NEGATIVE NEGATIVE Final    Comment: (NOTE) The Xpert Xpress SARS-CoV-2/FLU/RSV plus assay is intended as an  aid in the diagnosis of influenza from Nasopharyngeal swab specimens and should not be used as a sole basis for treatment. Nasal washings and aspirates are unacceptable for Xpert Xpress SARS-CoV-2/FLU/RSV testing.  Fact Sheet for Patients: EntrepreneurPulse.com.au  Fact Sheet for Healthcare Providers: IncredibleEmployment.be  This test is not yet approved or cleared by the [place] FDA and has been authorized for detection and/or diagnosis of SARS-CoV-2 by FDA under an Emergency Use Authorization (EUA). This EUA will remain in effect (meaning this test can be used) for the duration of the COVID-19 declaration under Section 564(b)(1) of the Act, 21 U.S.C. section 360bbb-3(b)(1), unless the authorization is terminated or revoked.  Performed at [facility], [place] [address] [place], [place] [address]   Urine Culture     Status: Abnormal (Preliminary result)   Collection Time: 06/04/21  7:30 PM   Specimen: Urine, Catheterized  Result Value Ref Range Status   Specimen Description URINE, CATHETERIZED  Final   Special Requests NONE  Final   Culture (A)  Final    >=100,000 COLONIES/mL ENTEROCOCCUS FAECALIS VANCOMYCIN RESISTANT ENTEROCOCCUS ISOLATED 20,000 COLONIES/mL GRAM NEGATIVE RODS IDENTIFICATION AND SUSCEPTIBILITIES TO FOLLOW Performed at [facility], [place] [address] [place], [place] [address]    Report Status PENDING  Incomplete   Organism ID, Bacteria ENTEROCOCCUS FAECALIS (A)  Final      Susceptibility   Enterococcus faecalis - MIC*    AMPICILLIN <=2 SENSITIVE Sensitive     NITROFURANTOIN <=16 SENSITIVE Sensitive     VANCOMYCIN >=32 RESISTANT Resistant     LINEZOLID 2 SENSITIVE Sensitive     * >=100,000 COLONIES/mL ENTEROCOCCUS FAECALIS  Blood Culture (Routine X 2)     Status: None (Preliminary result)   Collection Time: 06/04/21  8:02 PM   Specimen: BLOOD  Result Value Ref Range Status   Specimen Description BLOOD  BLOOD LEFT FOREARM  Final   Special Requests   Final    BOTTLES DRAWN AEROBIC AND ANAEROBIC Blood Culture adequate volume   Culture   Final    NO GROWTH 3 DAYS Performed at [facility], [place] [address], [place],  [address]    Report Status PENDING  Incomplete  Blood Culture (Routine X 2)     Status: None (Preliminary result)   Collection Time: 06/04/21  8:30 PM   Specimen: BLOOD  Result Value Ref Range Status   Specimen Description BLOOD LEFT ANTECUBITAL  Final   Special Requests   Final    BOTTLES DRAWN AEROBIC AND ANAEROBIC Blood Culture adequate volume   Culture   Final    NO GROWTH 3 DAYS Performed at [facility], [place] [address], [place],  [address]    Report Status PENDING  Incomplete  MRSA Next Gen by PCR, Nasal     Status: None   Collection Time: 06/05/21  6:37 PM   Specimen: Nasal Mucosa;  Nasal Swab  Result Value Ref Range Status   MRSA by PCR Next Gen NOT DETECTED NOT DETECTED Final    Comment: (NOTE) The GeneXpert MRSA Assay (FDA approved for NASAL specimens only), is one component of a comprehensive MRSA colonization surveillance program. It is not intended to diagnose MRSA infection nor to guide or monitor treatment for MRSA infections. Test performance is not FDA approved in patients less than 38 years old. Performed at [facility], [place] [address] [place], [place] [address]      Time coordinating discharge: Over 30 minutes  SIGNED:   [name], DO [facility] 06/07/2021, 11:41 AM Pager   If 7PM-7AM, please contact night-coverage [email or web address]

## 2021-06-07 NOTE — Progress Notes (Signed)
This chaplain responded to Pt. Code Blue with the medical team. The chaplain understands the Pt. Recovered and the Pt. RN-Kim will notify the Pt. daughter of the significant event.  F/U spiritual care is available as needed.  Chaplain Sallyanne Kuster [phone]

## 2021-06-07 NOTE — Progress Notes (Signed)
Patient seen today by trach team for consult.  No education is needed at this time.  All necessary equipment is at beside.   Will continue to follow for progression.  

## 2021-06-07 NOTE — Consult Note (Signed)
NAME:  Austin Davis, MRN:  [id number], DOB:  Apr 21, 1940, LOS: 2 ADMISSION DATE:  06/04/2021, CONSULTATION DATE: 5/19 REFERRING MD:  Avon Gully , CHIEF COMPLAINT:  brief cardiac arrest   History of Present Illness:  81 year old male patient with chronic respiratory failure who is ventilator dependent residing at [facility] admitted 5/18 for evaluation and for encephalopathy.  Most recently admitted from 5/10 through 5/12 for seizure-like activity felt possibly exacerbated by urinary tract infection.  Was admitted from the 18th, EEG obtained and was negative, urine culture remarkable for VRE and placed on Zyvox.  Foley was exchanged.  He was cleared for discharge.  Well getting ready for discharge he was being placed on transport ventilator.  During change he was placed back on pressure control to match mode he was on at [facility], while moving and repositioning he had minute ventilation dropped down to approximately 1 L, he subsequently turned gray, became hypoxic, had weak pulses and became unresponsive.  He was manually Ambu bag, LOC rapidly improved, he was placed on volume assured ventilation, and subsequently critical care was called to evaluate.  Pertinent  Medical History  Congestive heart failure, coronary artery disease, essential tremor, GI bleed, headaches, mitral regurg, mitral prolapse, osteopenia, paroxysmal atrial fibrillation, seizure disorder.  Chronic hypotension.  Significant Hospital Events: Including procedures, antibiotic start and stop dates in addition to other pertinent events   5/18 admitted for acute encephalopathy felt secondary to urinary tract infection 5/19 brief hypoxia mediated cardiac arrest secondary to drop in minute ventilation  Interim History / Subjective:  Restless's ventilator dependent male  Objective   Blood pressure (Abnormal) 144/76, pulse (Abnormal) 104, temperature 98.1 F (36.7 C), temperature source Oral, resp. rate 18, height 5\' 10"  (1.778 m),  weight 57.6 kg, SpO2 (Abnormal) 86 %.    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  There is no evidence of obstruction Lungs: Diffuse rhonchi currently tachypneic Cardiovascular: Regular rate and rhythm Abdomen: Soft not tender Extremities: Warm dry Neuro: Awake agitated moving all extremity GU: Cloudy yellow  Resolved Hospital Problem list        Assessment & Plan:  Principal Problem:   Urinary tract infection associated with indwelling urethral catheter (HCC) Active Problems:   Paroxysmal atrial fibrillation (HCC)   Chronic hypotension   Acute metabolic encephalopathy   Chronic obstructive pulmonary disease (HCC)   Parkinsonism (HCC)   Ventilator dependent (HCC)   Seizure disorder (HCC)   Chronic respiratory failure with hypoxia (HCC)   Hypothyroidism   Gastrostomy tube dependent ([place])   Depression with anxiety   Cardiac arrest due to respiratory disorder (HCC)    Acute hypoxia mediated cardiac arrest -Return of spontaneous circulation established simply by manual Ambu bag Plan/recommendation See below  Acute on chronic respiratory failure in the setting of mucous  plugging and severe deconditioning.  Patient's frail, has significant muscular wasting, ineffective cough.  During transport on pressure control ventilation minute  ventilation dropped from baseline minute ventilation of around 11 L down to 1.5 is witnessed by respiratory therapy.  Plan/recommendation Given how little reserves he has would recommend keeping him on a volume assured mode of ventilation Current VT 450 RR 24 Peep 5 fio2 40%  Continue scheduled bronchodilators Continue routine tracheostomy care  Acute metabolic encephalopathy This time was admitted for seizure, versus infection.  Please note we saw him just recently on 4/19 after he was evaluated for tracheostomy obstruction.  In hindsight I wonder if what is happening here is loss of minute ventilation resulting in these acute mental status changes Plan Continue supportive care Ventilation recommendations as above Best Practice (right click and "Reselect all SmartList Selections" daily)   Per above   Labs   CBC: Recent Labs  Lab 06/04/21 2002 06/04/21 2010 06/04/21 2104 06/04/21 2106 06/05/21 0401  WBC  --  15.5*  --   --  8.7  NEUTROABS  --  13.8*  --   --   --   HGB 10.5* 10.4* 10.5* 10.5* 9.3*  HCT 31.0* 31.3* 31.0* 31.0* 28.0*  MCV  --  88.4  --   --  88.1  PLT  --  246  --   --  465    Basic Metabolic Panel: Recent Labs  Lab 06/04/21 2002 06/04/21 2010 06/04/21 2104 06/04/21 2106 06/05/21 0401 06/05/21 1153 06/05/21 1710 06/06/21 0614 06/06/21 1631  NA 135 135 135 135 134*  --   --   --   --   K 4.9 5.0 4.8 4.8 4.2  --   --   --   --   CL  --  105  --  103 105  --   --   --   --   CO2  --  22  --   --  23  --   --   --   --   GLUCOSE  --  130*  --  128* 100*  --   --   --   --   BUN  --  14  --  14 13  --   --   --   --   CREATININE  --  0.69  --  0.60* 0.52*  --   --   --   --   CALCIUM  --  9.2  --   --  8.8*  --   --   --   --   MG  --   --   --   --  2.1 2.1 1.9 1.9 2.0  PHOS  --   --   --   --   --  2.9 3.0 3.1 3.5   GFR: Estimated Creatinine Clearance: 60 mL/min (A) (by C-G formula based on SCr of 0.52 mg/dL (L)). Recent Labs  Lab 06/04/21 1958  06/04/21 2010 06/05/21 0401  WBC  --  15.5* 8.7  LATICACIDVEN 1.4  --   --     Liver Function Tests: Recent Labs  Lab 06/04/21 2010  AST 19  ALT 31  ALKPHOS 100  BILITOT 0.4  PROT 6.6  ALBUMIN 3.0*   No results for input(s): LIPASE, AMYLASE in the last 168 hours. Recent Labs  Lab 06/04/21 2010  AMMONIA 34    ABG    Component Value Date/Time   PHART 7.424 06/04/2021 2002  PCO2ART 40.1 06/04/2021 2002   PO2ART 382 (H) 06/04/2021 2002   HCO3 25.4 06/04/2021 2104   TCO2 24 06/04/2021 2106   ACIDBASEDEF 2.0 05/30/2021 0052   O2SAT 99 06/04/2021 2104     Coagulation Profile: No results for input(s): INR, PROTIME in the last 168 hours.  Cardiac Enzymes: No results for input(s): CKTOTAL, CKMB, CKMBINDEX, TROPONINI in the last 168 hours.  HbA1C: Hgb A1c MFr Bld  Date/Time Value Ref Range Status  04/11/2021 09:12 PM 5.5 4.8 - 5.6 % Final    Comment:    (NOTE) Pre diabetes:          5.7%-6.4%  Diabetes:              >6.4%  Glycemic control for   <7.0% adults with diabetes   10/02/2020 04:46 AM 6.1 (H) 4.8 - 5.6 % Final    Comment:    (NOTE) Pre diabetes:          5.7%-6.4%  Diabetes:              >6.4%  Glycemic control for   <7.0% adults with diabetes     CBG: Recent Labs  Lab 06/06/21 2327 06/07/21 0354 06/07/21 0704 06/07/21 1124 06/07/21 1514  GLUCAP 112* 108* 138* 127* 100*    Review of Systems:   Not able  Past Medical History:  He,  has a past medical history of CHF (congestive heart failure) ([place]), Coronary artery disease, Essential tremor, GI bleed (2019), Headache, Low blood sugar, Mitral regurgitation, Mitral valve prolapse, Osteopenia (2021), and Paroxysmal atrial fibrillation ([place]).   Surgical History:   Past Surgical History:  Procedure Laterality Date   COLONOSCOPY WITH PROPOFOL N/A 02/19/2017   Procedure: COLONOSCOPY WITH PROPOFOL;  Surgeon: Wilford Corner, MD;  Location: [facility];  Service: Endoscopy;  Laterality:  N/A;   IR GASTROSTOMY TUBE MOD SED  10/18/2020   IR IVC FILTER PLMT / S&I /IMG GUID/MOD SED  11/01/2020   laser eye surgery for retina detachment     MITRAL VALVE REPAIR  01/2003   monitor  02/05/2006   polyp removal     TONSILLECTOMY     tooth removal     as a teenager   TRACHEOSTOMY TUBE PLACEMENT N/A 10/26/2020   Procedure: TRACHEOSTOMY;  Surgeon: Rozetta Nunnery, MD;  Location: [facility];  Service: ENT;  Laterality: N/A;     Social History:   reports that he has never smoked. He has never used smokeless tobacco. He reports current alcohol use. He reports that he does not use drugs.   Family History:  His family history includes Asthma in his daughter; CAD in his mother; Epilepsy in his son; Heart disease in his mother; Heart failure in his father, maternal grandmother, and paternal grandmother; Migraines in his mother; Pneumonia in his maternal grandfather; Skin cancer in his father; Stroke in his paternal grandfather; Tremor in his father; Valvular heart disease in his father. There is no history of Parkinson's disease.   Allergies Allergies  Allergen Reactions   Prednisone Other (See Comments)    Makes "skin crawl," rapid HR- "Allergic," per [facility]   Cortisone Palpitations and Other (See Comments)    "Allergic," per [facility]     Home Medications  Prior to Admission medications   Medication Sig Start Date End Date Taking? Authorizing Provider  acetaminophen (TYLENOL) 650 MG CR tablet 650 mg See admin instructions. 650 mg, per tube, every 8 hours as needed for moderate pain   Yes [provider]  ALPRAZolam (XANAX) 0.5 MG tablet Place 1 tablet (0.5 mg total) into feeding tube every 8 (eight) hours as needed for anxiety. 05/31/21  Yes Pahwani, Einar Grad, MD  escitalopram (LEXAPRO) 10 MG tablet Place 10 mg into feeding tube daily.   Yes [provider]  esomeprazole (NEXIUM) 40 MG packet 40 mg See admin instructions. 40 mg, per tube, every morning   Yes [provider]   fexofenadine (ALLEGRA) 180 MG tablet Place 180 mg into feeding tube daily.   Yes [provider]  fluticasone (FLONASE) 50 MCG/ACT nasal spray Place 2 sprays into both nostrils in the morning and at bedtime.   Yes [provider]  folic acid (FOLVITE) 1 MG tablet Take 1 tablet (1 mg total) by mouth daily. Patient taking differently: Place 1 mg into feeding tube daily. 09/29/20  Yes [name], MD  furosemide (LASIX) 20 MG tablet Place 1 tablet (20 mg total) into feeding tube daily as needed for fluid or edema. 04/16/21  Yes Vann, Jessica U, DO  GERI-TUSSIN 100 MG/5ML liquid Place 400 mg into feeding tube 3 (three) times daily.   Yes [provider]  hydrOXYzine (ATARAX) 25 MG tablet Place 25 mg into feeding tube every 6 (six) hours as needed for anxiety.   Yes [provider]  lactulose (CHRONULAC) 10 GM/15ML solution Place 20 g into feeding tube 2 (two) times daily as needed for mild constipation.   Yes [provider]  levalbuterol (XOPENEX) 1.25 MG/3ML nebulizer solution Take 1.25 mg by nebulization every 6 (six) hours.   Yes [provider]  levETIRAcetam (KEPPRA) 100 MG/ML solution Place 1,000 mg into feeding tube in the morning and at bedtime.   Yes [provider]  levothyroxine (SYNTHROID) 25 MCG tablet Place 25 mcg into feeding tube daily before breakfast.   Yes [provider]  midodrine (PROAMATINE) 10 MG tablet Place 1 tablet (10 mg total) into feeding tube 3 (three) times daily with meals. 05/01/21  Yes Thurnell Lose, MD  Multiple Vitamin (MULTIVITAMIN WITH MINERALS) TABS tablet Place 1 tablet into feeding tube daily. 05/11/21  Yes Sheikh, Omair Latif, DO  Nutritional Supplements (FEEDING SUPPLEMENT, JEVITY 1.5 CAL/FIBER,) LIQD Place 1,000 mLs into feeding tube continuous. Patient taking differently: Place 55 mL/hr into feeding tube continuous. 05/10/21  Yes Sheikh, Omair Latif, DO  Nutritional Supplements  (FEEDING SUPPLEMENT, JEVITY 1.5 CAL/FIBER,) LIQD Place 55 mL/hr into feeding tube continuous.   Yes [provider]  Omega-3 Fatty Acids (SUPER OMEGA 3 EPA/DHA) 1000 MG CAPS Give 1,000 mg by tube daily.   Yes [provider]  ondansetron (ZOFRAN-ODT) 4 MG disintegrating tablet 4 mg See admin instructions. 4 mg, per tube, every six hours as needed for nausea with vomiting   Yes [provider]  polyethylene glycol powder (GLYCOLAX/MIRALAX) 17 GM/SCOOP powder Place 17 g into feeding tube See admin instructions. 17 grams, per tube, every 2 days   Yes [provider]  Probiotic Product (CULTURELLE PROBIOTICS PO) Give 1 capsule by tube daily. 10 billion cell   Yes [provider]  propranolol (INDERAL) 10 MG tablet Place 1 tablet (10 mg total) into feeding tube daily as needed (for tremors in AM. do not administer if heart rate is below 60 or systolic blood pressure is below 90.). Patient taking differently: Place 10 mg into feeding tube See admin instructions. 10 mg, per tube, in the morning as needed for tremors and do not administer if heart rate  is less than 60 or Systolic B/P is less than 90 05/01/21  Yes Thurnell Lose, MD  sodium chloride (OCEAN) 0.65 % SOLN nasal spray Place 1 spray into both nostrils every 4 (four) hours as needed (for allergic rhinitis).   Yes [provider]  tamsulosin (FLOMAX) 0.4 MG CAPS capsule 0.4 mg See admin instructions. 0.4 mg per tube at bedtime   Yes [provider]  vitamin C (ASCORBIC ACID) 500 MG tablet Place 500 mg into feeding tube daily.   Yes [provider]  zinc gluconate 50 MG tablet Place 50 mg into feeding tube 2 (two) times daily.   Yes [provider]  ondansetron (ZOFRAN) 4 MG tablet Take 1 tablet (4 mg total) by mouth every 6 (six) hours as needed for nausea. Patient not taking: Reported on 06/04/2021 05/10/21   Kerney Elbe, DO     Critical care time: 31 minutes     Erick Colace ACNP-BC [place] Pager # [phone] OR # [phone] if no answer

## 2021-06-08 DIAGNOSIS — Z95828 Presence of other vascular implants and grafts: Secondary | ICD-10-CM | POA: Diagnosis not present

## 2021-06-08 DIAGNOSIS — D72829 Elevated white blood cell count, unspecified: Secondary | ICD-10-CM | POA: Diagnosis not present

## 2021-06-08 DIAGNOSIS — G40909 Epilepsy, unspecified, not intractable, without status epilepticus: Secondary | ICD-10-CM | POA: Diagnosis not present

## 2021-06-08 DIAGNOSIS — I9589 Other hypotension: Secondary | ICD-10-CM | POA: Diagnosis not present

## 2021-06-08 DIAGNOSIS — J9503 Malfunction of tracheostomy stoma: Secondary | ICD-10-CM | POA: Diagnosis not present

## 2021-06-08 DIAGNOSIS — Z86718 Personal history of other venous thrombosis and embolism: Secondary | ICD-10-CM | POA: Diagnosis not present

## 2021-06-08 DIAGNOSIS — R258 Other abnormal involuntary movements: Secondary | ICD-10-CM | POA: Diagnosis not present

## 2021-06-08 DIAGNOSIS — Z431 Encounter for attention to gastrostomy: Secondary | ICD-10-CM | POA: Diagnosis not present

## 2021-06-08 DIAGNOSIS — E43 Unspecified severe protein-calorie malnutrition: Secondary | ICD-10-CM | POA: Diagnosis not present

## 2021-06-08 DIAGNOSIS — I2510 Atherosclerotic heart disease of native coronary artery without angina pectoris: Secondary | ICD-10-CM | POA: Diagnosis not present

## 2021-06-08 DIAGNOSIS — I468 Cardiac arrest due to other underlying condition: Secondary | ICD-10-CM | POA: Diagnosis not present

## 2021-06-08 DIAGNOSIS — F419 Anxiety disorder, unspecified: Secondary | ICD-10-CM | POA: Diagnosis not present

## 2021-06-08 DIAGNOSIS — I480 Paroxysmal atrial fibrillation: Secondary | ICD-10-CM | POA: Diagnosis not present

## 2021-06-08 DIAGNOSIS — F418 Other specified anxiety disorders: Secondary | ICD-10-CM | POA: Diagnosis not present

## 2021-06-08 DIAGNOSIS — T83511A Infection and inflammatory reaction due to indwelling urethral catheter, initial encounter: Secondary | ICD-10-CM | POA: Diagnosis not present

## 2021-06-08 DIAGNOSIS — Z66 Do not resuscitate: Secondary | ICD-10-CM | POA: Diagnosis not present

## 2021-06-08 DIAGNOSIS — R569 Unspecified convulsions: Secondary | ICD-10-CM | POA: Diagnosis not present

## 2021-06-08 DIAGNOSIS — J9611 Chronic respiratory failure with hypoxia: Secondary | ICD-10-CM | POA: Diagnosis not present

## 2021-06-08 DIAGNOSIS — J989 Respiratory disorder, unspecified: Secondary | ICD-10-CM

## 2021-06-08 DIAGNOSIS — G9341 Metabolic encephalopathy: Secondary | ICD-10-CM | POA: Diagnosis not present

## 2021-06-08 DIAGNOSIS — R000 Tachycardia, unspecified: Secondary | ICD-10-CM | POA: Diagnosis not present

## 2021-06-08 DIAGNOSIS — Z9911 Dependence on respirator [ventilator] status: Secondary | ICD-10-CM

## 2021-06-08 DIAGNOSIS — Z931 Gastrostomy status: Secondary | ICD-10-CM | POA: Diagnosis not present

## 2021-06-08 DIAGNOSIS — N390 Urinary tract infection, site not specified: Secondary | ICD-10-CM | POA: Diagnosis not present

## 2021-06-08 DIAGNOSIS — I509 Heart failure, unspecified: Secondary | ICD-10-CM | POA: Diagnosis not present

## 2021-06-08 DIAGNOSIS — F32A Depression, unspecified: Secondary | ICD-10-CM | POA: Diagnosis not present

## 2021-06-08 DIAGNOSIS — J9621 Acute and chronic respiratory failure with hypoxia: Secondary | ICD-10-CM | POA: Diagnosis not present

## 2021-06-08 DIAGNOSIS — Z681 Body mass index (BMI) 19 or less, adult: Secondary | ICD-10-CM | POA: Diagnosis not present

## 2021-06-08 DIAGNOSIS — J439 Emphysema, unspecified: Secondary | ICD-10-CM | POA: Diagnosis not present

## 2021-06-08 DIAGNOSIS — R64 Cachexia: Secondary | ICD-10-CM | POA: Diagnosis not present

## 2021-06-08 DIAGNOSIS — J449 Chronic obstructive pulmonary disease, unspecified: Secondary | ICD-10-CM | POA: Diagnosis not present

## 2021-06-08 DIAGNOSIS — Z930 Tracheostomy status: Secondary | ICD-10-CM

## 2021-06-08 DIAGNOSIS — Z430 Encounter for attention to tracheostomy: Secondary | ICD-10-CM | POA: Diagnosis not present

## 2021-06-08 DIAGNOSIS — E039 Hypothyroidism, unspecified: Secondary | ICD-10-CM | POA: Diagnosis not present

## 2021-06-08 DIAGNOSIS — R0989 Other specified symptoms and signs involving the circulatory and respiratory systems: Secondary | ICD-10-CM | POA: Diagnosis not present

## 2021-06-08 DIAGNOSIS — G20 Parkinson's disease: Secondary | ICD-10-CM | POA: Diagnosis not present

## 2021-06-08 DIAGNOSIS — I5042 Chronic combined systolic (congestive) and diastolic (congestive) heart failure: Secondary | ICD-10-CM | POA: Diagnosis not present

## 2021-06-08 DIAGNOSIS — L89151 Pressure ulcer of sacral region, stage 1: Secondary | ICD-10-CM | POA: Diagnosis not present

## 2021-06-08 DIAGNOSIS — J9620 Acute and chronic respiratory failure, unspecified whether with hypoxia or hypercapnia: Secondary | ICD-10-CM | POA: Diagnosis not present

## 2021-06-08 LAB — URINE CULTURE: Culture: >=100000 — AB

## 2021-06-08 LAB — GLUCOSE, CAPILLARY
Glucose-Capillary: 112 mg/dL — ABNORMAL HIGH (ref 70–99)
Glucose-Capillary: 151 mg/dL — ABNORMAL HIGH (ref 70–99)
Glucose-Capillary: 112 mg/dL — ABNORMAL HIGH (ref 70–99)

## 2021-06-08 NOTE — Consult Note (Signed)
NAME:  Austin Davis, MRN:  [id number], DOB:  [birth year], LOS: 3 ADMISSION DATE:  06/04/2021, CONSULTATION DATE: 5/19 REFERRING MD:  Avon Gully , CHIEF COMPLAINT:  brief cardiac arrest   History of Present Illness:  81 year old male patient with chronic respiratory failure who is ventilator dependent residing at [facility] admitted 5/18 for evaluation and for encephalopathy.  Most recently admitted from 5/10 through 5/12 for seizure-like activity felt possibly exacerbated by urinary tract infection.  Was admitted from the 18th, EEG obtained and was negative, urine culture remarkable for VRE and placed on Zyvox.  Foley was exchanged.  He was cleared for discharge.  Well getting ready for discharge he was being placed on transport ventilator.  During change he was placed back on pressure control to match mode he was on at [facility], while moving and repositioning he had minute ventilation dropped down to approximately 1 L, he subsequently turned gray, became hypoxic, had weak pulses and became unresponsive.  He was manually Ambu bag, LOC rapidly improved, he was placed on volume assured ventilation, and subsequently critical care was called to evaluate.  Pertinent  Medical History  Congestive heart failure, coronary artery disease, essential tremor, GI bleed, headaches, mitral regurg, mitral prolapse, osteopenia, paroxysmal atrial fibrillation, seizure disorder.  Chronic hypotension.  Significant Hospital Events: Including procedures, antibiotic start and stop dates in addition to other pertinent events   5/18 admitted for acute encephalopathy felt secondary to urinary tract infection 5/19 brief hypoxia mediated cardiac arrest secondary to drop in minute ventilation  Interim History / Subjective:  NAEON. Feeling fine.  Objective   Blood pressure 108/71, pulse 62, temperature (!) 97.2 F (36.2 C), temperature source Oral, resp. rate 16, height 5\' 10"  (1.778 m), weight 57 kg, SpO2 100 %.    Vent  Mode: PRVC FiO2 (%):  [40 %] 40 % Set Rate:  [24 bmp] 24 bmp Vt Set:  [450 mL] 450 mL PEEP:  [5 cmH20] 5 cmH20 Plateau Pressure:  [13 cmH20-18 cmH20] 13 cmH20   Intake/Output Summary (Last 24 hours) at 06/08/2021 1200 Last data filed at 06/08/2021 6712 Gross per 24 hour  Intake 720 ml  Output 1250 ml  Net -530 ml    Filed Weights   06/06/21 0500 06/07/21 0500 06/08/21 0500  Weight: 55.7 kg 57.6 kg 57 kg    Examination: General: Chronically ill-appearing currently on full ventilator support HENT: Size 6 distal XLT cuffed inflated,  Lungs: Diffuse rhonchi, NWOB on vent Cardiovascular: Regular rate and rhythm Abdomen: Soft not tender Extremities: Warm dry Neuro: Awake, alert GU: Cloudy yellow  Resolved Hospital Problem list     Assessment & Plan:  Principal Problem:   Urinary tract infection associated with indwelling urethral catheter (HCC) Active Problems:   Paroxysmal atrial fibrillation (HCC)   Chronic hypotension   Acute metabolic encephalopathy   Chronic obstructive pulmonary disease (HCC)   Parkinsonism (HCC)   Ventilator dependent (HCC)   Seizure disorder (HCC)   Chronic respiratory failure with hypoxia (HCC)   Hypothyroidism   Gastrostomy tube dependent ([place])   Depression with anxiety   Cardiac arrest due to respiratory disorder (HCC)    Acute on chronic respiratory failure in the setting of mucous plugging and severe deconditioning.  Patient's frail, has significant muscular wasting, ineffective cough.  During transport on pressure control ventilation minute ventilation dropped from baseline minute ventilation of around 11 L down to 1.5 is witnessed by respiratory therapy.  Plan/recommendation Given how little reserves he has would recommend keeping him  on a volume assured mode of ventilation Current VT 450 RR 24 Peep 5 fio2 40%  Continue scheduled bronchodilators Continue routine tracheostomy care  Acute metabolic encephalopathy This time was admitted  for seizure, versus infection.  Please note PCCM saw him recently on 4/19 after he was evaluated for tracheostomy obstruction.  In hindsight, wonder if what is happening here is loss of minute ventilation resulting in these acute mental status changes Plan Continue supportive care Ventilation recommendations as above Best Practice (right click and "Reselect all SmartList Selections" daily)   Per above   Labs   CBC: Recent Labs  Lab 06/04/21 2002 06/04/21 2010 06/04/21 2104 06/04/21 2106 06/05/21 0401  WBC  --  15.5*  --   --  8.7  NEUTROABS  --  13.8*  --   --   --   HGB 10.5* 10.4* 10.5* 10.5* 9.3*  HCT 31.0* 31.3* 31.0* 31.0* 28.0*  MCV  --  88.4  --   --  88.1  PLT  --  246  --   --  216     Basic Metabolic Panel: Recent Labs  Lab 06/04/21 2002 06/04/21 2010 06/04/21 2104 06/04/21 2106 06/05/21 0401 06/05/21 1153 06/05/21 1710 06/06/21 0614 06/06/21 1631  NA 135 135 135 135 134*  --   --   --   --   K 4.9 5.0 4.8 4.8 4.2  --   --   --   --   CL  --  105  --  103 105  --   --   --   --   CO2  --  22  --   --  23  --   --   --   --   GLUCOSE  --  130*  --  128* 100*  --   --   --   --   BUN  --  14  --  14 13  --   --   --   --   CREATININE  --  0.69  --  0.60* 0.52*  --   --   --   --   CALCIUM  --  9.2  --   --  8.8*  --   --   --   --   MG  --   --   --   --  2.1 2.1 1.9 1.9 2.0  PHOS  --   --   --   --   --  2.9 3.0 3.1 3.5    GFR: Estimated Creatinine Clearance: 59.4 mL/min (A) (by C-G formula based on SCr of 0.52 mg/dL (L)). Recent Labs  Lab 06/04/21 1958 06/04/21 2010 06/05/21 0401  WBC  --  15.5* 8.7  LATICACIDVEN 1.4  --   --      Liver Function Tests: Recent Labs  Lab 06/04/21 2010  AST 19  ALT 31  ALKPHOS 100  BILITOT 0.4  PROT 6.6  ALBUMIN 3.0*    No results for input(s): LIPASE, AMYLASE in the last 168 hours. Recent Labs  Lab 06/04/21 2010  AMMONIA 34     ABG    Component Value Date/Time   PHART 7.424 06/04/2021 2002    PCO2ART 40.1 06/04/2021 2002   PO2ART 382 (H) 06/04/2021 2002   HCO3 25.4 06/04/2021 2104   TCO2 24 06/04/2021 2106   ACIDBASEDEF 2.0 05/30/2021 0052   O2SAT 99 06/04/2021 2104      Coagulation Profile: No results for input(s): INR,  PROTIME in the last 168 hours.  Cardiac Enzymes: No results for input(s): CKTOTAL, CKMB, CKMBINDEX, TROPONINI in the last 168 hours.  HbA1C: Hgb A1c MFr Bld  Date/Time Value Ref Range Status  04/11/2021 09:12 PM 5.5 4.8 - 5.6 % Final    Comment:    (NOTE) Pre diabetes:          5.7%-6.4%  Diabetes:              >6.4%  Glycemic control for   <7.0% adults with diabetes   10/02/2020 04:46 AM 6.1 (H) 4.8 - 5.6 % Final    Comment:    (NOTE) Pre diabetes:          5.7%-6.4%  Diabetes:              >6.4%  Glycemic control for   <7.0% adults with diabetes     CBG: Recent Labs  Lab 06/07/21 1927 06/07/21 2320 06/08/21 0323 06/08/21 0741 06/08/21 1125  GLUCAP 120* 103* 112* 151* 112*     Review of Systems:   Not able  Past Medical History:  He,  has a past medical history of CHF (congestive heart failure) ([place]), Coronary artery disease, Essential tremor, GI bleed (2019), Headache, Low blood sugar, Mitral regurgitation, Mitral valve prolapse, Osteopenia (2021), and Paroxysmal atrial fibrillation ([place]).   Surgical History:   Past Surgical History:  Procedure Laterality Date   COLONOSCOPY WITH PROPOFOL N/A 02/19/2017   Procedure: COLONOSCOPY WITH PROPOFOL;  Surgeon: Wilford Corner, MD;  Location: [facility];  Service: Endoscopy;  Laterality: N/A;   IR GASTROSTOMY TUBE MOD SED  10/18/2020   IR IVC FILTER PLMT / S&I /IMG GUID/MOD SED  11/01/2020   laser eye surgery for retina detachment     MITRAL VALVE REPAIR  01/2003   monitor  02/05/2006   polyp removal     TONSILLECTOMY     tooth removal     as a teenager   TRACHEOSTOMY TUBE PLACEMENT N/A 10/26/2020   Procedure: TRACHEOSTOMY;  Surgeon: Rozetta Nunnery, MD;  Location:  [place];  Service: ENT;  Laterality: N/A;     Social History:   reports that he has never smoked. He has never used smokeless tobacco. He reports current alcohol use. He reports that he does not use drugs.   Family History:  His family history includes Asthma in his daughter; CAD in his mother; Epilepsy in his son; Heart disease in his mother; Heart failure in his father, maternal grandmother, and paternal grandmother; Migraines in his mother; Pneumonia in his maternal grandfather; Skin cancer in his father; Stroke in his paternal grandfather; Tremor in his father; Valvular heart disease in his father. There is no history of Parkinson's disease.   Allergies Allergies  Allergen Reactions   Prednisone Other (See Comments)    Makes "skin crawl," rapid HR- "Allergic," per MAR   Cortisone Palpitations and Other (See Comments)    "Allergic," per [facility]     Home Medications  Prior to Admission medications   Medication Sig Start Date End Date Taking? Authorizing Provider  acetaminophen (TYLENOL) 650 MG CR tablet 650 mg See admin instructions. 650 mg, per tube, every 8 hours as needed for moderate pain   Yes [provider]  ALPRAZolam (XANAX) 0.5 MG tablet Place 1 tablet (0.5 mg total) into feeding tube every 8 (eight) hours as needed for anxiety. 05/31/21  Yes Pahwani, Einar Grad, MD  escitalopram (LEXAPRO) 10 MG tablet Place 10 mg into feeding tube daily.  Yes [provider]  esomeprazole (NEXIUM) 40 MG packet 40 mg See admin instructions. 40 mg, per tube, every morning   Yes [provider]  fexofenadine (ALLEGRA) 180 MG tablet Place 180 mg into feeding tube daily.   Yes [provider]  fluticasone (FLONASE) 50 MCG/ACT nasal spray Place 2 sprays into both nostrils in the morning and at bedtime.   Yes [provider]  folic acid (FOLVITE) 1 MG tablet Take 1 tablet (1 mg total) by mouth daily. Patient taking differently: Place 1 mg into feeding tube daily.  09/29/20  Yes [name], MD  furosemide (LASIX) 20 MG tablet Place 1 tablet (20 mg total) into feeding tube daily as needed for fluid or edema. 04/16/21  Yes Vann, Jessica U, DO  GERI-TUSSIN 100 MG/5ML liquid Place 400 mg into feeding tube 3 (three) times daily.   Yes [provider]  hydrOXYzine (ATARAX) 25 MG tablet Place 25 mg into feeding tube every 6 (six) hours as needed for anxiety.   Yes [provider]  lactulose (CHRONULAC) 10 GM/15ML solution Place 20 g into feeding tube 2 (two) times daily as needed for mild constipation.   Yes [provider]  levalbuterol (XOPENEX) 1.25 MG/3ML nebulizer solution Take 1.25 mg by nebulization every 6 (six) hours.   Yes [provider]  levETIRAcetam (KEPPRA) 100 MG/ML solution Place 1,000 mg into feeding tube in the morning and at bedtime.   Yes [provider]  levothyroxine (SYNTHROID) 25 MCG tablet Place 25 mcg into feeding tube daily before breakfast.   Yes [provider]  midodrine (PROAMATINE) 10 MG tablet Place 1 tablet (10 mg total) into feeding tube 3 (three) times daily with meals. 05/01/21  Yes Thurnell Lose, MD  Multiple Vitamin (MULTIVITAMIN WITH MINERALS) TABS tablet Place 1 tablet into feeding tube daily. 05/11/21  Yes Sheikh, Omair Latif, DO  Nutritional Supplements (FEEDING SUPPLEMENT, JEVITY 1.5 CAL/FIBER,) LIQD Place 1,000 mLs into feeding tube continuous. Patient taking differently: Place 55 mL/hr into feeding tube continuous. 05/10/21  Yes Sheikh, Omair Latif, DO  Nutritional Supplements (FEEDING SUPPLEMENT, JEVITY 1.5 CAL/FIBER,) LIQD Place 55 mL/hr into feeding tube continuous.   Yes [provider]  Omega-3 Fatty Acids (SUPER OMEGA 3 EPA/DHA) 1000 MG CAPS Give 1,000 mg by tube daily.   Yes [provider]  ondansetron (ZOFRAN-ODT) 4 MG disintegrating tablet 4 mg See admin instructions. 4 mg, per tube, every six hours as needed for nausea with vomiting    Yes [provider]  polyethylene glycol powder (GLYCOLAX/MIRALAX) 17 GM/SCOOP powder Place 17 g into feeding tube See admin instructions. 17 grams, per tube, every 2 days   Yes [provider]  Probiotic Product (CULTURELLE PROBIOTICS PO) Give 1 capsule by tube daily. 10 billion cell   Yes [provider]  propranolol (INDERAL) 10 MG tablet Place 1 tablet (10 mg total) into feeding tube daily as needed (for tremors in AM. do not administer if heart rate is below 60 or systolic blood pressure is below 90.). Patient taking differently: Place 10 mg into feeding tube See admin instructions. 10 mg, per tube, in the morning as needed for tremors and do not administer if heart rate is less than 60 or Systolic B/P is less than 90 05/01/21  Yes Thurnell Lose, MD  sodium chloride (OCEAN) 0.65 % SOLN nasal spray Place 1 spray into both nostrils every 4 (four) hours as needed (for allergic rhinitis).   Yes [provider]  tamsulosin (FLOMAX) 0.4 MG CAPS capsule 0.4 mg See admin instructions. 0.4 mg per tube at bedtime   Yes [provider]  vitamin C (ASCORBIC ACID) 500 MG tablet Place 500 mg into feeding tube daily.   Yes [provider]  zinc gluconate 50 MG tablet Place 50 mg into feeding tube 2 (two) times daily.   Yes [provider]  ondansetron (ZOFRAN) 4 MG tablet Take 1 tablet (4 mg total) by mouth every 6 (six) hours as needed for nausea. Patient not taking: Reported on 06/04/2021 05/10/21   Kerney Elbe, DO     Critical care time: n/a    Lanier Clam, MD See Shea Evans for contact info [place]

## 2021-06-08 NOTE — TOC Transition Note (Addendum)
Transition of Care [facility]) - CM/SW Discharge Note   Patient Details  Name: NOBEL BRAR MRN: [id number] Date of Birth: 1940/03/02  [facility] CM/SW Contact:  Joanne Chars, LCSW Phone Number: 06/08/2021, 12:26 PM   Clinical Narrative:   Pt discharging to [facility], room 303B.  RN to call report prior to discharge [phone]) Accepting doctor is Dr. Abelardo Diesel.   [facility] called to transport patient.   1130: CSW spoke with Angie at [facility], she asked that new DC summary be faxed, they can receive pt today.    Final next level of care: Skilled Nursing Facility Barriers to Discharge: Barriers Resolved   Patient Goals and CMS Choice Patient states their goals for this hospitalization and ongoing recovery are:: Patient intubated CMS Medicare.gov Compare Post Acute Care list provided to:: Patient Represenative (must comment) Choice offered to / list presented to : Adult Children  Discharge Placement              Patient chooses bed at:  ([facility]) Patient to be transferred to facility by: [facility] Name of family member notified: daughter [name] Patient and family notified of of transfer: 06/08/21  Discharge Plan and Services                                     Social Determinants of Health (SDOH) Interventions     Readmission Risk Interventions     View : No data to display.

## 2021-06-08 NOTE — Discharge Summary (Signed)
Physician Discharge Summary  PIERSON VANTOL MLY:[id number] DOB: March 25, 1940 DOA: 06/04/2021  PCP: Aaron Edelman, MD  Admit date: 06/04/2021 Discharge date: 06/08/2021  Admitted From: [facility] Disposition: Same  Recommendations for Outpatient Follow-up:  Follow up with PCP, neurology as scheduled  Discharge Condition: Guarded CODE STATUS: Partial, no compressions Diet recommendation: PEG tube feeds as below  Brief/Interim Summary: Austin Davis is a 81 y.o. 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  Unclear if patient had transient episodic seizure episode versus worsening UTI, EEG was unremarkable at intake, no medication changes from a neurology standpoint.  Patient's urinalysis and urine culture were remarkable, appears to be colonized with VRE with more than 100,000 colonies concerning for acute UTI despite recent antibiotics in the outpatient setting.  Given resistances patient was placed on linezolid and has now completed 3-day course.  Foley was exchanged in intake and otherwise is back to baseline.  Lengthy discussion with patient and daughter about keeping his Foley catheter clean as well as to avoid future infections as well as to monitor symptoms more closely in hopes to catch UTI early to avoid any possible disseminated infection.  He remains high risk for readmission given his age chronic comorbid conditions and bedbound status.  Patient had hypoxic/bradycardic event at time of discharge 06/07/21 - PCCM requested to evaluate - recommending PRVC/similar mode  (Currently PRVC 24/450/5/40%) moving forward and recommend against pressure control mode for this patient. We discussed this may further explain his episodes of confusion/altered mental status.  Patient stable for discharge back to facility.  Discharge Diagnoses:  Principal Problem:   Urinary tract infection associated with indwelling urethral catheter (HCC) Active Problems:   Acute metabolic encephalopathy   Ventilator dependent (HCC)   Chronic respiratory failure with hypoxia (HCC)   Paroxysmal atrial fibrillation (HCC)   Seizure disorder (HCC)   Chronic hypotension   Chronic obstructive pulmonary disease (HCC)   Parkinsonism (HCC)   Hypothyroidism   Gastrostomy tube dependent ([name])   Depression with anxiety   Cardiac arrest due to respiratory disorder Austin Davis)    Discharge Instructions    Allergies as of 06/08/2021       Reactions   Prednisone Other (See Comments)   Makes "skin crawl," rapid HR- "Allergic," per Austin Davis   Cortisone Palpitations, Other (See Comments)   "Allergic," per Austin Davis        Medication List     STOP taking these medications    sulfamethoxazole-trimethoprim 800-160 MG tablet Commonly known as: BACTRIM DS       TAKE these medications    acetaminophen 650 MG CR tablet Commonly known as: TYLENOL 650 mg See admin instructions. 650 mg, per tube, every 8 hours as needed for moderate pain   ALPRAZolam 0.5 MG tablet Commonly known as: XANAX Place 1 tablet (0.5 mg total) into feeding tube every 8 (eight) hours as needed for anxiety.   CULTURELLE PROBIOTICS PO Give 1 capsule by tube daily. 10 billion cell   escitalopram 10 MG tablet Commonly known as: LEXAPRO Place 10 mg into feeding tube daily.   esomeprazole 40 MG packet Commonly known as: NEXIUM 40 mg See admin instructions. 40 mg,  per tube, every morning   feeding supplement (JEVITY 1.5 CAL/FIBER) Liqd Place 55 mL/hr into feeding tube continuous. What changed: Another medication with the  same name was changed. Make sure you understand how and when to take each.   feeding supplement (JEVITY 1.5 CAL/FIBER) Liqd Place 1,000 mLs into feeding tube continuous. What changed: how much to take   fexofenadine 180 MG tablet Commonly known as: ALLEGRA Place 180 mg into feeding tube daily.   fluticasone 50 MCG/ACT nasal spray Commonly known as: FLONASE Place 2 sprays into both nostrils in the morning and at bedtime.   folic acid 1 MG tablet Commonly known as: FOLVITE Take 1 tablet (1 mg total) by mouth daily. What changed: how to take this   furosemide 20 MG tablet Commonly known as: LASIX Place 1 tablet (20 mg total) into feeding tube daily as needed for fluid or edema.   Geri-Tussin 100 MG/5ML liquid Generic drug: guaiFENesin Place 400 mg into feeding tube 3 (three) times daily.   hydrOXYzine 25 MG tablet Commonly known as: ATARAX Place 25 mg into feeding tube every 6 (six) hours as needed for anxiety.   lactulose 10 GM/15ML solution Commonly known as: CHRONULAC Place 20 g into feeding tube 2 (two) times daily as needed for mild constipation.   levalbuterol 1.25 MG/3ML nebulizer solution Commonly known as: XOPENEX Take 1.25 mg by nebulization every 6 (six) hours.   levETIRAcetam 100 MG/ML solution Commonly known as: KEPPRA Place 1,000 mg into feeding tube in the morning and at bedtime.   levothyroxine 25 MCG tablet Commonly known as: SYNTHROID Place 25 mcg into feeding tube daily before breakfast.   midodrine 10 MG tablet Commonly known as: PROAMATINE Place 1 tablet (10 mg total) into feeding tube 3 (three) times daily with meals.   multivitamin with minerals Tabs tablet Place 1 tablet into feeding tube daily.   ondansetron 4 MG disintegrating tablet Commonly known as: ZOFRAN-ODT 4 mg See admin instructions. 4 mg, per tube, every six hours as needed for nausea with vomiting   ondansetron 4 MG tablet Commonly known as: ZOFRAN Take 1 tablet (4 mg total)  by mouth every 6 (six) hours as needed for nausea.   polyethylene glycol powder 17 GM/SCOOP powder Commonly known as: GLYCOLAX/MIRALAX Place 17 g into feeding tube See admin instructions. 17 grams, per tube, every 2 days   propranolol 10 MG tablet Commonly known as: INDERAL Place 1 tablet (10 mg total) into feeding tube daily as needed (for tremors in AM. do not administer if heart rate is below 60 or systolic blood pressure is below 90.). What changed:  when to take this additional instructions   sodium chloride 0.65 % Soln nasal spray Commonly known as: OCEAN Place 1 spray into both nostrils every 4 (four) hours as needed (for allergic rhinitis).   Super Omega 3 EPA/DHA 1000 MG Caps Give 1,000 mg by tube daily.   tamsulosin 0.4 MG Caps capsule Commonly known as: FLOMAX 0.4 mg See admin instructions. 0.4 mg per tube at bedtime   vitamin C 500 MG tablet Commonly known as: ASCORBIC ACID Place 500 mg into feeding tube daily.   zinc gluconate 50 MG tablet Place 50 mg into feeding tube 2 (two) times daily.        Allergies  Allergen Reactions   Prednisone Other (See Comments)    Makes "skin crawl," rapid HR- "Allergic," per MAR   Cortisone Palpitations and Other (See Comments)    "Allergic," per [facility]  Consultations: None  Procedures/Studies: CT HEAD WO CONTRAST  Result Date: 06/04/2021 CLINICAL DATA:  Seizure today.  Altered mental status. EXAM: CT HEAD WITHOUT CONTRAST TECHNIQUE: Contiguous axial images were obtained from the base of the skull through the vertex without intravenous contrast. RADIATION DOSE REDUCTION: This exam was performed according to the departmental dose-optimization program which includes automated exposure control, adjustment of the mA and/or kV according to patient size and/or use of iterative reconstruction technique. COMPARISON:  05/08/2021 FINDINGS: Brain: No evidence of intracranial hemorrhage, acute infarction, hydrocephalus, extra-axial  collection, or mass lesion/mass effect. Mild generalized cerebral atrophy is unchanged in appearance. Vascular:  No hyperdense vessel or other acute findings. Skull: No evidence of fracture or other significant bone abnormality. Sinuses/Orbits:  No acute findings. Other: None. IMPRESSION: No acute intracranial abnormality. Stable mild cerebral atrophy. Electronically Signed   By: Marlaine Hind M.D.   On: 06/04/2021 21:07   DG CHEST PORT 1 VIEW  Result Date: 06/07/2021 CLINICAL DATA:  Hypoxia EXAM: PORTABLE CHEST 1 VIEW COMPARISON:  06/04/2021 FINDINGS: Tracheostomy tube is noted in satisfactory position. Cardiac shadow is stable. Postsurgical changes are again noted. The lungs are well aerated bilaterally. No focal confluent infiltrate is seen. No bony abnormality is noted. IMPRESSION: No acute abnormality noted. Electronically Signed   By: Inez Catalina M.D.   On: 06/07/2021 19:15   DG Chest Port 1 View  Result Date: 06/04/2021 CLINICAL DATA:  Altered mental status. EXAM: PORTABLE CHEST 1 VIEW COMPARISON:  Chest radiograph dated 05/30/2021. FINDINGS: Tracheostomy above the carina. There is diffuse chronic intra coarsening. Bibasilar densities may represent atelectasis. Developing infiltrate is not excluded. No pleural effusion or pneumothorax. The cardiac silhouette is within limits. Median sternotomy wires. No acute osseous pathology. IMPRESSION: Bibasilar atelectasis versus less likely developing infiltrate. Electronically Signed   By: Anner Crete M.D.   On: 06/04/2021 20:39   DG Chest Port 1 View  Result Date: 05/30/2021 CLINICAL DATA:  Seizures EXAM: PORTABLE CHEST 1 VIEW COMPARISON:  05/10/2021 FINDINGS: Prior CABG. Heart is normal size. Tracheostomy again noted. Left lower lobe opacity. Right lung clear. No effusions or acute bony abnormality. IMPRESSION: Left lower lobe opacity.  Cannot exclude pneumonia. Electronically Signed   By: Rolm Baptise M.D.   On: 05/30/2021 00:20   DG CHEST PORT 1  VIEW  Result Date: 05/10/2021 CLINICAL DATA:  Encounter for shortness of breath. EXAM: PORTABLE CHEST 1 VIEW COMPARISON:  CTA chest and portable chest both 05/08/2021. FINDINGS: 4:34 a.m., 05/10/2021. Tracheostomy cannula tip remains 3.6 cm from the carina. There is overlying monitor wiring. Intact median sternotomy sutures. Stable mediastinum with aortic atherosclerosis, tortuosity and ectasia. The cardiac size is normal. Bilateral basal airspace disease is probably not significantly changed but not well evaluated with portable chest radiograph atrophy because the majority of was behind the hemidiaphragms, with asymmetric elevation of the right diaphragm. 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 Left lower lobe spiculated nodule noted on CT is not visible on this x-ray. IMPRESSION: Right-greater-than-left bibasilar airspace disease is probably unchanged but not well evaluated on portable chest radiography due to its location. Known spiculated nodule in the left lower lobe is not visible either. No other evidence of acute chest process. Electronically Signed   By: Telford Nab M.D.   On: 05/10/2021 06:42   EEG adult  Result Date: 06/05/2021 Lora Havens, MD     06/05/2021  8:29 AM Patient Name: Austin Davis MRN: [id number] Epilepsy Attending: Lora Havens Referring Physician/Provider:  Regan Lemming, MD Date: 06/04/2021 Duration: 21.36 mins Patient history: 81 year old male presented with seizure-like activity.  EEG evaluate for seizure. Level of alertness: lethargic AEDs during EEG study: LEV Technical aspects: This EEG study was done with scalp electrodes positioned according to the 10-20 International system of electrode placement. Electrical activity was acquired at a sampling rate of 500Hz  and reviewed with a high frequency filter of 70Hz  and a low frequency filter of 1Hz . EEG data were recorded continuously and digitally stored. Description: EEG showed continuous generalized 3 to 7 Hz  theta-delta slowing admixed with 15 to 18 Hz generalized beta activity. Hyperventilation and photic stimulation were not performed.   ABNORMALITY - Continuous slow, generalized IMPRESSION: This study is suggestive of severe diffuse encephalopathy, nonspecific etiology. No seizures or epileptiform discharges were seen throughout the recording. Lora Havens   EEG adult  Result Date: 05/30/2021 Lora Havens, MD     05/30/2021 10:45 AM Patient Name: BOE DEANS MRN: [id number] Epilepsy Attending: Lora Havens Referring Physician/Provider: Donnetta Simpers, MD Date: 05/30/2021 Duration: 23 mins Patient history: 81 y.o. male with PMH significant for remor, paroxysmal atrial fibrillation not on anticoagulation, presumed seizures on Keppra 750mg  BID who presents from his LTAC with atleast a 40 mins episode concerning for prolonged seizures x 2.  EEG to evaluate for seizure. Level of alertness: Awake AEDs during EEG study: LEV Technical aspects: This EEG study was done with scalp electrodes positioned according to the 10-20 International system of electrode placement. Electrical activity was acquired at a sampling rate of 500Hz  and reviewed with a high frequency filter of 70Hz  and a low frequency filter of 1Hz . EEG data were recorded continuously and digitally stored. Description: The posterior dominant rhythm consists of 8 Hz activity of moderate voltage (25-35 uV) seen predominantly in posterior head regions, symmetric and reactive to eye opening and eye closing. Hyperventilation and photic stimulation were not performed.   Patient was noted to have near continuous mouth tremor throughout the study. Concomitant EEG before, during and after the event did not show any EEG change to suggest seizure.  IMPRESSION: This study is within normal limits. No seizures or epileptiform discharges were seen throughout the recording.  Patient was noted to have mouth tremor throughout the study without concomitant EEG  change.  These episodes were nonepileptic.  Priyanka Barbra Sarks     Subjective: No acute issues or events overnight, recovered from previous bradycardic/hypoxic event.   Discharge Exam: Vitals:   06/08/21 0500 06/08/21 0600  BP: (!) 89/55 (!) 89/55  Pulse: 69 67  Resp: (!) 25 15  Temp:    SpO2: 99% 100%   Vitals:   06/08/21 0336 06/08/21 0400 06/08/21 0500 06/08/21 0600  BP:  106/66 (!) 89/55 (!) 89/55  Pulse:  79 69 67  Resp:  (!) 24 (!) 25 15  Temp: 97.8 F (36.6 C)     TempSrc: Axillary     SpO2:  100% 99% 100%  Weight:   57 kg   Height:        General:  Pleasantly resting in bed, No acute distress. HEENT:  Trach clean dry and intact. Neck:  Without mass or deformity.  Trachea is midline. Lungs:  Clear to auscultate bilaterally without rhonchi, wheeze, or rales. Heart:  Regular rate and rhythm.  Without murmurs, rubs, or gallops. Abdomen:  Soft, nontender, nondistended.  Without guarding or rebound. Peg/Foley clean dry intact. Extremities: Without cyanosis, clubbing, edema, or obvious deformity. Vascular:  Dorsalis pedis  and posterior tibial pulses palpable bilaterally. Skin:  Warm and dry, no ecchymoses   The results of significant diagnostics from this hospitalization (including imaging, microbiology, ancillary and laboratory) are listed below for reference.     Microbiology: Recent Results (from the past 240 hour(s))  Urine Culture     Status: Abnormal   Collection Time: 05/30/21  4:50 AM   Specimen: Urine, Catheterized  Result Value Ref Range Status   Specimen Description URINE, CATHETERIZED  Final   Special Requests   Final    Normal Performed at [facility], [address], [place], [place] [address]    Culture MULTIPLE SPECIES PRESENT, SUGGEST RECOLLECTION (A)  Final   Report Status 05/31/2021 FINAL  Final  Resp Panel by RT-PCR (Flu A&B, Covid) Nasopharyngeal Swab     Status: None   Collection Time: 05/31/21 10:29 AM   Specimen: Nasopharyngeal  Swab; Nasopharyngeal(NP) swabs in vial transport medium  Result Value Ref Range Status   SARS Coronavirus 2 by RT PCR NEGATIVE NEGATIVE Final    Comment: (NOTE) SARS-CoV-2 target nucleic acids are NOT DETECTED.  The SARS-CoV-2 RNA is generally detectable in upper respiratory specimens during the acute phase of infection. The lowest concentration of SARS-CoV-2 viral copies this assay can detect is 138 copies/mL. A negative result does not preclude SARS-Cov-2 infection and should not be used as the sole basis for treatment or other patient management decisions. A negative result may occur with  improper specimen collection/handling, submission of specimen other than nasopharyngeal swab, presence of viral mutation(s) within the areas targeted by this assay, and inadequate number of viral copies(<138 copies/mL). A negative result must be combined with clinical observations, patient history, and epidemiological information. The expected result is Negative.  Fact Sheet for Patients:  EntrepreneurPulse.com.au  Fact Sheet for Healthcare Providers:  IncredibleEmployment.be  This test is no t yet approved or cleared by the [place] FDA and  has been authorized for detection and/or diagnosis of SARS-CoV-2 by FDA under an Emergency Use Authorization (EUA). This EUA will remain  in effect (meaning this test can be used) for the duration of the COVID-19 declaration under Section 564(b)(1) of the Act, 21 U.S.C.section 360bbb-3(b)(1), unless the authorization is terminated  or revoked sooner.       Influenza A by PCR NEGATIVE NEGATIVE Final   Influenza B by PCR NEGATIVE NEGATIVE Final    Comment: (NOTE) The Xpert Xpress SARS-CoV-2/FLU/RSV plus assay is intended as an aid in the diagnosis of influenza from Nasopharyngeal swab specimens and should not be used as a sole basis for treatment. Nasal washings and aspirates are unacceptable for Xpert Xpress  SARS-CoV-2/FLU/RSV testing.  Fact Sheet for Patients: EntrepreneurPulse.com.au  Fact Sheet for Healthcare Providers: IncredibleEmployment.be  This test is not yet approved or cleared by the [place] FDA and has been authorized for detection and/or diagnosis of SARS-CoV-2 by FDA under an Emergency Use Authorization (EUA). This EUA will remain in effect (meaning this test can be used) for the duration of the COVID-19 declaration under Section 564(b)(1) of the Act, 21 U.S.C. section 360bbb-3(b)(1), unless the authorization is terminated or revoked.  Performed at [facility], [place] [address], [place], [place] [address]   Urine Culture     Status: Abnormal   Collection Time: 06/04/21  7:30 PM   Specimen: Urine, Catheterized  Result Value Ref Range Status   Specimen Description URINE, CATHETERIZED  Final   Special Requests   Final    NONE Performed  at [facility], [place] [address], [place], [place] [address]    Culture (A)  Final    >=100,000 COLONIES/mL ENTEROCOCCUS FAECALIS VANCOMYCIN RESISTANT ENTEROCOCCUS ISOLATED 20,000 COLONIES/mL ESCHERICHIA COLI Confirmed Extended Spectrum Beta-Lactamase Producer (ESBL).  In bloodstream infections from ESBL organisms, carbapenems are preferred over piperacillin/tazobactam. They are shown to have a lower risk of mortality.    Report Status 06/08/2021 FINAL  Final   Organism ID, Bacteria ENTEROCOCCUS FAECALIS (A)  Final   Organism ID, Bacteria ESCHERICHIA COLI (A)  Final      Susceptibility   Escherichia coli - MIC*    AMPICILLIN >=32 RESISTANT Resistant     CEFAZOLIN >=64 RESISTANT Resistant     CEFEPIME >=32 RESISTANT Resistant     CEFTRIAXONE >=64 RESISTANT Resistant     CIPROFLOXACIN >=4 RESISTANT Resistant     GENTAMICIN <=1 SENSITIVE Sensitive     IMIPENEM <=0.25 SENSITIVE Sensitive     NITROFURANTOIN <=16 SENSITIVE Sensitive     TRIMETH/SULFA <=20 SENSITIVE Sensitive      AMPICILLIN/SULBACTAM >=32 RESISTANT Resistant     PIP/TAZO 16 SENSITIVE Sensitive     * 20,000 COLONIES/mL ESCHERICHIA COLI   Enterococcus faecalis - MIC*    AMPICILLIN <=2 SENSITIVE Sensitive     NITROFURANTOIN <=16 SENSITIVE Sensitive     VANCOMYCIN >=32 RESISTANT Resistant     LINEZOLID 2 SENSITIVE Sensitive     * >=100,000 COLONIES/mL ENTEROCOCCUS FAECALIS  Blood Culture (Routine X 2)     Status: None (Preliminary result)   Collection Time: 06/04/21  8:02 PM   Specimen: BLOOD  Result Value Ref Range Status   Specimen Description BLOOD BLOOD LEFT FOREARM  Final   Special Requests   Final    BOTTLES DRAWN AEROBIC AND ANAEROBIC Blood Culture adequate volume   Culture   Final    NO GROWTH 3 DAYS Performed at [facility], [address], [place], [place] [address]    Report Status PENDING  Incomplete  Blood Culture (Routine X 2)     Status: None (Preliminary result)   Collection Time: 06/04/21  8:30 PM   Specimen: BLOOD  Result Value Ref Range Status   Specimen Description BLOOD LEFT ANTECUBITAL  Final   Special Requests   Final    BOTTLES DRAWN AEROBIC AND ANAEROBIC Blood Culture adequate volume   Culture   Final    NO GROWTH 3 DAYS Performed at [facility], [place] [address], [place], [place] [address]    Report Status PENDING  Incomplete  MRSA Next Gen by PCR, Nasal     Status: None   Collection Time: 06/05/21  6:37 PM   Specimen: Nasal Mucosa; Nasal Swab  Result Value Ref Range Status   MRSA by PCR Next Gen NOT DETECTED NOT DETECTED Final    Comment: (NOTE) The GeneXpert MRSA Assay (FDA approved for NASAL specimens only), is one component of a comprehensive MRSA colonization surveillance program. It is not intended to diagnose MRSA infection nor to guide or monitor treatment for MRSA infections. Test performance is not FDA approved in patients less than 81 years old. Performed at [facility], [place] [address], [place], [place] [address]   Culture,  Respiratory w Gram Stain     Status: None (Preliminary result)   Collection Time: 06/07/21  6:48 PM   Specimen: Tracheal Aspirate; Respiratory  Result Value Ref Range Status   Specimen Description TRACHEAL ASPIRATE  Final   Special Requests NONE  Final  Gram Stain   Final    ABUNDANT WBC PRESENT, PREDOMINANTLY PMN RARE GRAM NEGATIVE RODS Performed at [facility], [place] [address] [place], [place] [address]    Culture PENDING  Incomplete   Report Status PENDING  Incomplete     Labs: BNP (last 3 results) Recent Labs    04/30/21 0030 05/01/21 0617 05/08/21 1145  BNP 134.2* 193.0* 166.0*   Basic Metabolic Panel: Recent Labs  Lab 06/04/21 2002 06/04/21 2010 06/04/21 2104 06/04/21 2106 06/05/21 0401 06/05/21 1153 06/05/21 1710 06/06/21 0614 06/06/21 1631  NA 135 135 135 135 134*  --   --   --   --   K 4.9 5.0 4.8 4.8 4.2  --   --   --   --   CL  --  105  --  103 105  --   --   --   --   CO2  --  22  --   --  23  --   --   --   --   GLUCOSE  --  130*  --  128* 100*  --   --   --   --   BUN  --  14  --  14 13  --   --   --   --   CREATININE  --  0.69  --  0.60* 0.52*  --   --   --   --   CALCIUM  --  9.2  --   --  8.8*  --   --   --   --   MG  --   --   --   --  2.1 2.1 1.9 1.9 2.0  PHOS  --   --   --   --   --  2.9 3.0 3.1 3.5   Liver Function Tests: Recent Labs  Lab 06/04/21 2010  AST 19  ALT 31  ALKPHOS 100  BILITOT 0.4  PROT 6.6  ALBUMIN 3.0*   No results for input(s): LIPASE, AMYLASE in the last 168 hours. Recent Labs  Lab 06/04/21 2010  AMMONIA 34   CBC: Recent Labs  Lab 06/04/21 2002 06/04/21 2010 06/04/21 2104 06/04/21 2106 06/05/21 0401  WBC  --  15.5*  --   --  8.7  NEUTROABS  --  13.8*  --   --   --   HGB 10.5* 10.4* 10.5* 10.5* 9.3*  HCT 31.0* 31.3* 31.0* 31.0* 28.0*  MCV  --  88.4  --   --  88.1  PLT  --  246  --   --  216   Cardiac Enzymes: No results for input(s): CKTOTAL, CKMB, CKMBINDEX, TROPONINI in the last 168  hours. BNP: Invalid input(s): POCBNP CBG: Recent Labs  Lab 06/07/21 1124 06/07/21 1514 06/07/21 1927 06/07/21 2320 06/08/21 0323  GLUCAP 127* 100* 120* 103* 112*   D-Dimer No results for input(s): DDIMER in the last 72 hours. Hgb A1c No results for input(s): HGBA1C in the last 72 hours. Lipid Profile No results for input(s): CHOL, HDL, LDLCALC, TRIG, CHOLHDL, LDLDIRECT in the last 72 hours. Thyroid function studies No results for input(s): TSH, T4TOTAL, T3FREE, THYROIDAB in the last 72 hours.  Invalid input(s): FREET3 Anemia work up No results for input(s): VITAMINB12, FOLATE, FERRITIN, TIBC, IRON, RETICCTPCT in the last 72 hours. Urinalysis    Component Value Date/Time   COLORURINE AMBER (A) 06/04/2021 1957   APPEARANCEUR CLOUDY (A) 06/04/2021 1957   LABSPEC  1.019 06/04/2021 1957   PHURINE 6.0 06/04/2021 1957   GLUCOSEU NEGATIVE 06/04/2021 1957   HGBUR NEGATIVE 06/04/2021 1957   BILIRUBINUR NEGATIVE 06/04/2021 1957   KETONESUR NEGATIVE 06/04/2021 1957   PROTEINUR 30 (A) 06/04/2021 1957   UROBILINOGEN 0.2 09/25/2006 1500   NITRITE NEGATIVE 06/04/2021 1957   LEUKOCYTESUR LARGE (A) 06/04/2021 1957   Sepsis Labs Invalid input(s): PROCALCITONIN,  WBC,  LACTICIDVEN Microbiology Recent Results (from the past 240 hour(s))  Urine Culture     Status: Abnormal   Collection Time: 05/30/21  4:50 AM   Specimen: Urine, Catheterized  Result Value Ref Range Status   Specimen Description URINE, CATHETERIZED  Final   Special Requests   Final    Normal Performed at [facility], [address], [place], [place] [address]    Culture MULTIPLE SPECIES PRESENT, SUGGEST RECOLLECTION (A)  Final   Report Status 05/31/2021 FINAL  Final  Resp Panel by RT-PCR (Flu A&B, Covid) Nasopharyngeal Swab     Status: None   Collection Time: 05/31/21 10:29 AM   Specimen: Nasopharyngeal Swab; Nasopharyngeal(NP) swabs in vial transport medium  Result Value Ref Range Status   SARS Coronavirus  2 by RT PCR NEGATIVE NEGATIVE Final    Comment: (NOTE) SARS-CoV-2 target nucleic acids are NOT DETECTED.  The SARS-CoV-2 RNA is generally detectable in upper respiratory specimens during the acute phase of infection. The lowest concentration of SARS-CoV-2 viral copies this assay can detect is 138 copies/mL. A negative result does not preclude SARS-Cov-2 infection and should not be used as the sole basis for treatment or other patient management decisions. A negative result may occur with  improper specimen collection/handling, submission of specimen other than nasopharyngeal swab, presence of viral mutation(s) within the areas targeted by this assay, and inadequate number of viral copies(<138 copies/mL). A negative result must be combined with clinical observations, patient history, and epidemiological information. The expected result is Negative.  Fact Sheet for Patients:  EntrepreneurPulse.com.au  Fact Sheet for Healthcare Providers:  IncredibleEmployment.be  This test is no t yet approved or cleared by the [place] FDA and  has been authorized for detection and/or diagnosis of SARS-CoV-2 by FDA under an Emergency Use Authorization (EUA). This EUA will remain  in effect (meaning this test can be used) for the duration of the COVID-19 declaration under Section 564(b)(1) of the Act, 21 U.S.C.section 360bbb-3(b)(1), unless the authorization is terminated  or revoked sooner.       Influenza A by PCR NEGATIVE NEGATIVE Final   Influenza B by PCR NEGATIVE NEGATIVE Final    Comment: (NOTE) The Xpert Xpress SARS-CoV-2/FLU/RSV plus assay is intended as an aid in the diagnosis of influenza from Nasopharyngeal swab specimens and should not be used as a sole basis for treatment. Nasal washings and aspirates are unacceptable for Xpert Xpress SARS-CoV-2/FLU/RSV testing.  Fact Sheet for Patients: EntrepreneurPulse.com.au  Fact  Sheet for Healthcare Providers: IncredibleEmployment.be  This test is not yet approved or cleared by the [place] FDA and has been authorized for detection and/or diagnosis of SARS-CoV-2 by FDA under an Emergency Use Authorization (EUA). This EUA will remain in effect (meaning this test can be used) for the duration of the COVID-19 declaration under Section 564(b)(1) of the Act, 21 U.S.C. section 360bbb-3(b)(1), unless the authorization is terminated or revoked.  Performed at [facility], [place] [address], [place], [place] [address]   Urine Culture     Status: Abnormal   Collection Time: 06/04/21  7:30  PM   Specimen: Urine, Catheterized  Result Value Ref Range Status   Specimen Description URINE, CATHETERIZED  Final   Special Requests   Final    NONE Performed at [facility], [address] [place], [place] [address]    Culture (A)  Final    >=100,000 COLONIES/mL ENTEROCOCCUS FAECALIS VANCOMYCIN RESISTANT ENTEROCOCCUS ISOLATED 20,000 COLONIES/mL ESCHERICHIA COLI Confirmed Extended Spectrum Beta-Lactamase Producer (ESBL).  In bloodstream infections from ESBL organisms, carbapenems are preferred over piperacillin/tazobactam. They are shown to have a lower risk of mortality.    Report Status 06/08/2021 FINAL  Final   Organism ID, Bacteria ENTEROCOCCUS FAECALIS (A)  Final   Organism ID, Bacteria ESCHERICHIA COLI (A)  Final      Susceptibility   Escherichia coli - MIC*    AMPICILLIN >=32 RESISTANT Resistant     CEFAZOLIN >=64 RESISTANT Resistant     CEFEPIME >=32 RESISTANT Resistant     CEFTRIAXONE >=64 RESISTANT Resistant     CIPROFLOXACIN >=4 RESISTANT Resistant     GENTAMICIN <=1 SENSITIVE Sensitive     IMIPENEM <=0.25 SENSITIVE Sensitive     NITROFURANTOIN <=16 SENSITIVE Sensitive     TRIMETH/SULFA <=20 SENSITIVE Sensitive     AMPICILLIN/SULBACTAM >=32 RESISTANT Resistant     PIP/TAZO 16 SENSITIVE Sensitive     * 20,000 COLONIES/mL  ESCHERICHIA COLI   Enterococcus faecalis - MIC*    AMPICILLIN <=2 SENSITIVE Sensitive     NITROFURANTOIN <=16 SENSITIVE Sensitive     VANCOMYCIN >=32 RESISTANT Resistant     LINEZOLID 2 SENSITIVE Sensitive     * >=100,000 COLONIES/mL ENTEROCOCCUS FAECALIS  Blood Culture (Routine X 2)     Status: None (Preliminary result)   Collection Time: 06/04/21  8:02 PM   Specimen: BLOOD  Result Value Ref Range Status   Specimen Description BLOOD BLOOD LEFT FOREARM  Final   Special Requests   Final    BOTTLES DRAWN AEROBIC AND ANAEROBIC Blood Culture adequate volume   Culture   Final    NO GROWTH 3 DAYS Performed at [facility], [address], [place], [place] [address]    Report Status PENDING  Incomplete  Blood Culture (Routine X 2)     Status: None (Preliminary result)   Collection Time: 06/04/21  8:30 PM   Specimen: BLOOD  Result Value Ref Range Status   Specimen Description BLOOD LEFT ANTECUBITAL  Final   Special Requests   Final    BOTTLES DRAWN AEROBIC AND ANAEROBIC Blood Culture adequate volume   Culture   Final    NO GROWTH 3 DAYS Performed at [facility], [place] [address], [place], [place] [address]    Report Status PENDING  Incomplete  MRSA Next Gen by PCR, Nasal     Status: None   Collection Time: 06/05/21  6:37 PM   Specimen: Nasal Mucosa; Nasal Swab  Result Value Ref Range Status   MRSA by PCR Next Gen NOT DETECTED NOT DETECTED Final    Comment: (NOTE) The GeneXpert MRSA Assay (FDA approved for NASAL specimens only), is one component of a comprehensive MRSA colonization surveillance program. It is not intended to diagnose MRSA infection nor to guide or monitor treatment for MRSA infections. Test performance is not FDA approved in patients less than 85 years old. Performed at [facility], [place] [address] [place], [place] [address]   Culture, Respiratory w Gram Stain     Status: None (Preliminary result)   Collection Time:  06/07/21  6:48 PM    Specimen: Tracheal Aspirate; Respiratory  Result Value Ref Range Status   Specimen Description TRACHEAL ASPIRATE  Final   Special Requests NONE  Final   Gram Stain   Final    ABUNDANT WBC PRESENT, PREDOMINANTLY PMN RARE GRAM NEGATIVE RODS Performed at [facility], [place] [address], [place],  [address]    Culture PENDING  Incomplete   Report Status PENDING  Incomplete     Time coordinating discharge: Over 30 minutes  SIGNED:   [name], DO [facility] 06/08/2021, 6:58 AM Pager   If 7PM-7AM, please contact night-coverage [email or web address]

## 2021-06-08 NOTE — Progress Notes (Signed)
NAME:  Austin Davis, MRN:  [id number], DOB:  Apr 23, 1940, LOS: 3 ADMISSION DATE:  06/04/2021, CONSULTATION DATE: 5/19 REFERRING MD:  Avon Gully , CHIEF COMPLAINT:  brief cardiac arrest   History of Present Illness:  81 year old male patient with chronic respiratory failure who is ventilator dependent residing at [place] admitted 5/18 for evaluation and for encephalopathy.  Most recently admitted from 5/10 through 5/12 for seizure-like activity felt possibly exacerbated by urinary tract infection.  Was admitted from the 18th, EEG obtained and was negative, urine culture remarkable for VRE and placed on Zyvox.  Foley was exchanged.  He was cleared for discharge.  Well getting ready for discharge he was being placed on transport ventilator.  During change he was placed back on pressure control to match mode he was on at [facility], while moving and repositioning he had minute ventilation dropped down to approximately 1 L, he subsequently turned gray, became hypoxic, had weak pulses and became unresponsive.  He was manually Ambu bag, LOC rapidly improved, he was placed on volume assured ventilation, and subsequently critical care was called to evaluate.  Pertinent  Medical History  Congestive heart failure, coronary artery disease, essential tremor, GI bleed, headaches, mitral regurg, mitral prolapse, osteopenia, paroxysmal atrial fibrillation, seizure disorder.  Chronic hypotension.  Significant Hospital Events: Including procedures, antibiotic start and stop dates in addition to other pertinent events   5/18 admitted for acute encephalopathy felt secondary to urinary tract infection 5/19 brief hypoxia mediated cardiac arrest secondary to drop in minute ventilation  Interim History / Subjective:  NAEO  Discussed events of yesterday and my concern that while pt ultimate goal is vent liberation this may not be feasible   Objective   Blood pressure 108/71, pulse 62, temperature 97.9 F (36.6 C),  temperature source Oral, resp. rate 16, height 5\' 10"  (1.778 m), weight 57 kg, SpO2 100 %.    Vent Mode: PRVC FiO2 (%):  [40 %] 40 % Set Rate:  [24 bmp] 24 bmp Vt Set:  [450 mL] 450 mL PEEP:  [5 cmH20] 5 cmH20 Plateau Pressure:  [13 cmH20-18 cmH20] 13 cmH20   Intake/Output Summary (Last 24 hours) at 06/08/2021 0844 Last data filed at 06/08/2021 0600 Gross per 24 hour  Intake 730 ml  Output 1400 ml  Net -670 ml   Filed Weights   06/06/21 0500 06/07/21 0500 06/08/21 0500  Weight: 55.7 kg 57.6 kg 57 kg    Examination: General: Chronically ill frail elderly M NAD  HENT:6 distal XLT in place  Lungs: Symmetrical chest expansion, mechanically ventilated  Cardiovascular:rrr  Abdomen: soft ndnt  Extremities: no acute deformity Neuro: awake alert, following commands, interactive. Communicating via board. Tremor  GU: defer  Resolved Hospital Problem list     Assessment & Plan:  Principal Problem:   Urinary tract infection associated with indwelling urethral catheter (HCC) Active Problems:   Paroxysmal atrial fibrillation (HCC)   Chronic hypotension   Acute metabolic encephalopathy   Chronic obstructive pulmonary disease (HCC)   Parkinsonism (HCC)   Ventilator dependent (HCC)   Seizure disorder (HCC)   Chronic respiratory failure with hypoxia (HCC)   Hypothyroidism   Gastrostomy tube dependent ([place])   Depression with anxiety   Cardiac arrest due to respiratory disorder (HCC)    Brief cardiac arrest -- 5/19 due to acute hypoxia, rosc with bagging  Acute on chronic hypoxic respiratory failure  Deconditioning Mucus plugging   5/19 desat: During transport on pressure control ventilation minute ventilation dropped from baseline minute  ventilation of around 11 L down to 1.5 is witnessed by respiratory therapy.  Plan/recommendation -maintain volume assured vent mode, do not change to PCV for transport  -Vt 450, RR 24 PEEP 5 FiO2 40%  -cont pulm hygiene, routine trach care,  Bds -discharging to [facility]   Best Practice (right click and "Reselect all SmartList Selections" daily)   Per above   Labs   CBC: Recent Labs  Lab 06/04/21 2002 06/04/21 2010 06/04/21 2104 06/04/21 2106 06/05/21 0401  WBC  --  15.5*  --   --  8.7  NEUTROABS  --  13.8*  --   --   --   HGB 10.5* 10.4* 10.5* 10.5* 9.3*  HCT 31.0* 31.3* 31.0* 31.0* 28.0*  MCV  --  88.4  --   --  88.1  PLT  --  246  --   --  086    Basic Metabolic Panel: Recent Labs  Lab 06/04/21 2002 06/04/21 2010 06/04/21 2104 06/04/21 2106 06/05/21 0401 06/05/21 1153 06/05/21 1710 06/06/21 0614 06/06/21 1631  NA 135 135 135 135 134*  --   --   --   --   K 4.9 5.0 4.8 4.8 4.2  --   --   --   --   CL  --  105  --  103 105  --   --   --   --   CO2  --  22  --   --  23  --   --   --   --   GLUCOSE  --  130*  --  128* 100*  --   --   --   --   BUN  --  14  --  14 13  --   --   --   --   CREATININE  --  0.69  --  0.60* 0.52*  --   --   --   --   CALCIUM  --  9.2  --   --  8.8*  --   --   --   --   MG  --   --   --   --  2.1 2.1 1.9 1.9 2.0  PHOS  --   --   --   --   --  2.9 3.0 3.1 3.5   GFR: Estimated Creatinine Clearance: 59.4 mL/min (A) (by C-G formula based on SCr of 0.52 mg/dL (L)). Recent Labs  Lab 06/04/21 1958 06/04/21 2010 06/05/21 0401  WBC  --  15.5* 8.7  LATICACIDVEN 1.4  --   --     Liver Function Tests: Recent Labs  Lab 06/04/21 2010  AST 19  ALT 31  ALKPHOS 100  BILITOT 0.4  PROT 6.6  ALBUMIN 3.0*   No results for input(s): LIPASE, AMYLASE in the last 168 hours. Recent Labs  Lab 06/04/21 2010  AMMONIA 34    ABG    Component Value Date/Time   PHART 7.424 06/04/2021 2002   PCO2ART 40.1 06/04/2021 2002   PO2ART 382 (H) 06/04/2021 2002   HCO3 25.4 06/04/2021 2104   TCO2 24 06/04/2021 2106   ACIDBASEDEF 2.0 05/30/2021 0052   O2SAT 99 06/04/2021 2104     Coagulation Profile: No results for input(s): INR, PROTIME in the last 168 hours.  Cardiac Enzymes: No  results for input(s): CKTOTAL, CKMB, CKMBINDEX, TROPONINI in the last 168 hours.  HbA1C: Hgb A1c MFr Bld  Date/Time Value Ref Range Status  04/11/2021 09:12 PM  5.5 4.8 - 5.6 % Final    Comment:    (NOTE) Pre diabetes:          5.7%-6.4%  Diabetes:              >6.4%  Glycemic control for   <7.0% adults with diabetes   10/02/2020 04:46 AM 6.1 (H) 4.8 - 5.6 % Final    Comment:    (NOTE) Pre diabetes:          5.7%-6.4%  Diabetes:              >6.4%  Glycemic control for   <7.0% adults with diabetes     CBG: Recent Labs  Lab 06/07/21 1514 06/07/21 1927 06/07/21 2320 06/08/21 0323 06/08/21 0741  GLUCAP 100* 120* 103* 112* 151*    CCT: n/a  Eliseo Gum MSN, AGACNP-BC [place] for pager  06/08/2021, 8:45 AM

## 2021-06-08 NOTE — Progress Notes (Signed)
[name] to be D/C'd [facility], per MD order.  Discussed with the patient and all questions fully answered.  VSS, Skin clean, dry and intact without evidence of skin break down, no evidence of skin tears noted. IV catheter discontinued intact. Site without signs and symptoms of complications. Dressing and pressure applied.  An After Visit Summary was printed and given to the patient. Patient received prescription.  D/c education completed with patient/family including follow up instructions, medication list, d/c activities limitations if indicated, with other d/c instructions as indicated by MD - patient able to verbalize understanding, all questions fully answered.   Patient instructed to return to ED, call 911, or call MD for any changes in condition.   Patient escorted via [facility], transport, Report called and given to the [facility] nurse.    Lorenza Evangelist Montejano 06/08/2021 1:15 PM

## 2021-06-09 ENCOUNTER — Telehealth: Payer: Self-pay | Admitting: Critical Care Medicine

## 2021-06-09 LAB — CULTURE, BLOOD (ROUTINE X 2)
Culture: NO GROWTH
Culture: NO GROWTH
Special Requests: ADEQUATE
Special Requests: ADEQUATE

## 2021-06-09 LAB — CULTURE, RESPIRATORY W GRAM STAIN

## 2021-06-09 NOTE — Telephone Encounter (Signed)
Resp culture with acinetobacter. I called [facility] and relayed the culture results and the sensitivities to a nurse to provide to the physician caring for Austin Davis.  Julian Hy, DO 06/09/21 11:37 AM [facility]

## 2021-06-13 DIAGNOSIS — J9620 Acute and chronic respiratory failure, unspecified whether with hypoxia or hypercapnia: Secondary | ICD-10-CM | POA: Diagnosis not present

## 2021-06-20 ENCOUNTER — Emergency Department (HOSPITAL_COMMUNITY): Payer: Medicare Other

## 2021-06-20 ENCOUNTER — Emergency Department (HOSPITAL_COMMUNITY)
Admission: EM | Admit: 2021-06-20 | Discharge: 2021-06-21 | Disposition: A | Discharge status: Other Acute Inpatient Hospital | Payer: Medicare Other | Attending: Emergency Medicine | Admitting: Emergency Medicine

## 2021-06-20 ENCOUNTER — Encounter (HOSPITAL_COMMUNITY): Payer: Self-pay | Admitting: Emergency Medicine

## 2021-06-20 DIAGNOSIS — D72829 Elevated white blood cell count, unspecified: Secondary | ICD-10-CM | POA: Insufficient documentation

## 2021-06-20 DIAGNOSIS — G20 Parkinson's disease: Secondary | ICD-10-CM | POA: Diagnosis not present

## 2021-06-20 DIAGNOSIS — R258 Other abnormal involuntary movements: Secondary | ICD-10-CM | POA: Diagnosis not present

## 2021-06-20 DIAGNOSIS — R000 Tachycardia, unspecified: Secondary | ICD-10-CM | POA: Diagnosis not present

## 2021-06-20 DIAGNOSIS — R569 Unspecified convulsions: Secondary | ICD-10-CM

## 2021-06-20 DIAGNOSIS — I509 Heart failure, unspecified: Secondary | ICD-10-CM | POA: Diagnosis not present

## 2021-06-20 LAB — CBC WITH DIFFERENTIAL/PLATELET
Abs Immature Granulocytes: 0.09 10*3/uL — ABNORMAL HIGH (ref 0.00–0.07)
Basophils Absolute: 0 10*3/uL (ref 0.0–0.1)
Basophils Relative: 0 %
Eosinophils Absolute: 0.1 10*3/uL (ref 0.0–0.5)
Eosinophils Relative: 1 %
HCT: 36.3 % — ABNORMAL LOW (ref 39.0–52.0)
Hemoglobin: 11.4 g/dL — ABNORMAL LOW (ref 13.0–17.0)
Immature Granulocytes: 1 %
Lymphocytes Relative: 2 %
Lymphs Abs: 0.4 10*3/uL — ABNORMAL LOW (ref 0.7–4.0)
MCH: 29.1 pg (ref 26.0–34.0)
MCHC: 31.4 g/dL (ref 30.0–36.0)
MCV: 92.6 fL (ref 80.0–100.0)
Monocytes Absolute: 0.9 10*3/uL (ref 0.1–1.0)
Monocytes Relative: 6 %
Neutro Abs: 15.1 10*3/uL — ABNORMAL HIGH (ref 1.7–7.7)
Neutrophils Relative %: 90 %
Platelets: 359 10*3/uL (ref 150–400)
RBC: 3.92 MIL/uL — ABNORMAL LOW (ref 4.22–5.81)
RDW: 16.3 % — ABNORMAL HIGH (ref 11.5–15.5)
WBC: 16.6 10*3/uL — ABNORMAL HIGH (ref 4.0–10.5)
nRBC: 0 % (ref 0.0–0.2)

## 2021-06-20 LAB — COMPREHENSIVE METABOLIC PANEL
ALT: 33 U/L (ref 0–44)
AST: 25 U/L (ref 15–41)
Albumin: 3.4 g/dL — ABNORMAL LOW (ref 3.5–5.0)
Alkaline Phosphatase: 118 U/L (ref 38–126)
Anion gap: 8 (ref 5–15)
BUN: 20 mg/dL (ref 8–23)
CO2: 31 mmol/L (ref 22–32)
Calcium: 9.8 mg/dL (ref 8.9–10.3)
Chloride: 102 mmol/L (ref 98–111)
Creatinine, Ser: 0.56 mg/dL — ABNORMAL LOW (ref 0.61–1.24)
GFR, Estimated: >60 mL/min (ref >60)
Glucose, Bld: 162 mg/dL — ABNORMAL HIGH (ref 70–99)
Potassium: 4.7 mmol/L (ref 3.5–5.1)
Sodium: 141 mmol/L (ref 135–145)
Total Bilirubin: 0.3 mg/dL (ref 0.3–1.2)
Total Protein: 7.4 g/dL (ref 6.5–8.1)

## 2021-06-20 LAB — MAGNESIUM: Magnesium: 2.3 mg/dL (ref 1.7–2.4)

## 2021-06-20 IMAGING — DX DG CHEST 1V PORT
1 series · 1 of 1 positions shown · non-contrast
Comparison: Chest x-ray [DATE]

CLINICAL DATA: Seizure.

EXAM:
PORTABLE CHEST 1 VIEW

[chest]
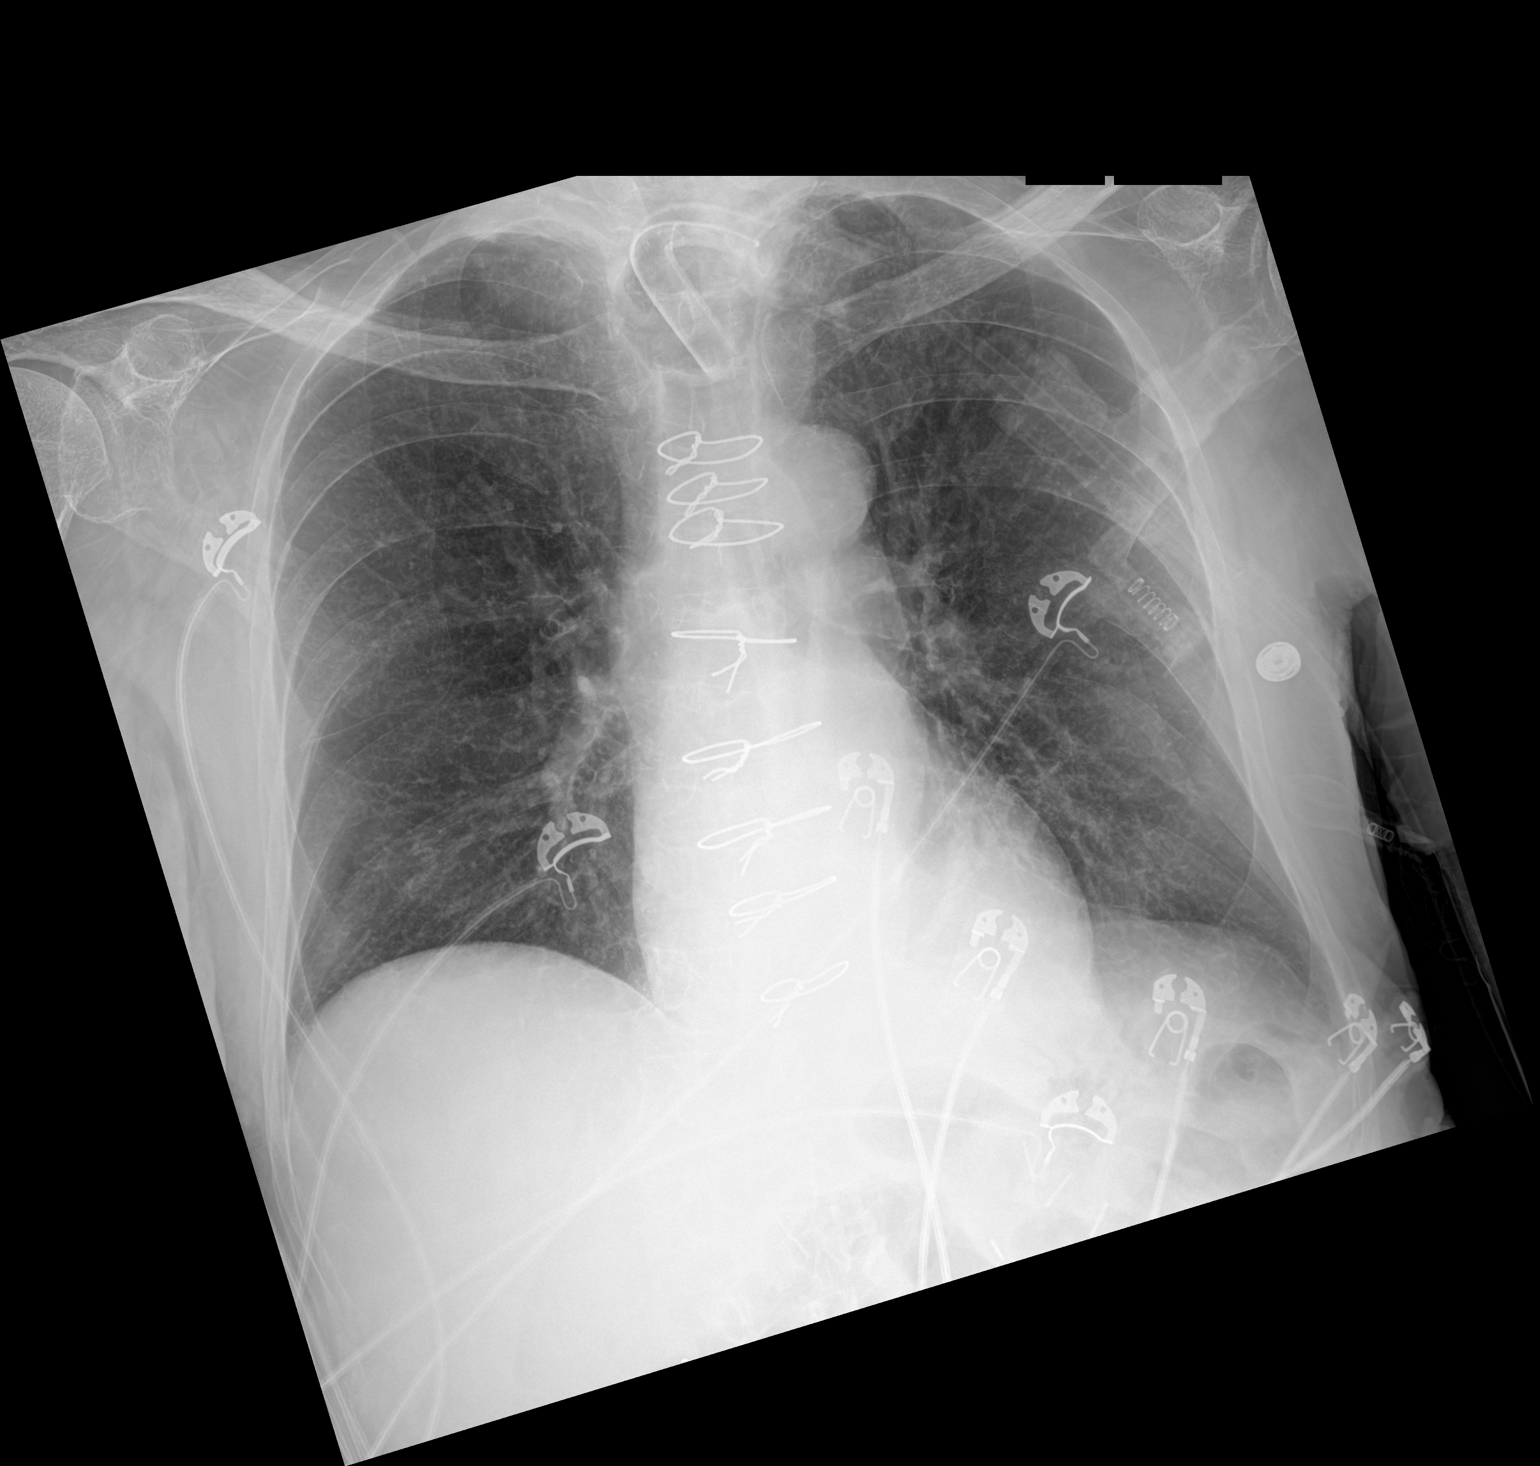

[1 of 1 positions shown; findings below may reference images not displayed]

FINDINGS: The tip of the tracheostomy is at the level of the clavicular heads,
unchanged. Median sternotomy wires are present. The
cardiomediastinal silhouette is within normal limits. There is no
focal lung infiltrate, pleural effusion or pneumothorax identified.
No acute fractures are seen.
IMPRESSION: No active disease.

## 2021-06-20 MED ORDER — LEVETIRACETAM IN NACL 1000 MG/100ML IV SOLN
2000.0000 mg | Freq: Once | INTRAVENOUS | Status: AC
  Administered 2021-06-20: 2000 mg via INTRAVENOUS
  Filled 2021-06-20 (×2): qty 200

## 2021-06-20 NOTE — ED Triage Notes (Signed)
Patient BIB [facility] from [facility] with reported seizure.  Staff reports they have no gave keppra yet and patient had a seizure.  Patient has jaw twitching now which staff report is baseline.  Patient able to respond appropriately and follow commands upon assessment.

## 2021-06-20 NOTE — ED Provider Notes (Signed)
[facility] EMERGENCY DEPARTMENT Provider Note   CSN: [id number] Arrival date & time: 06/20/21  2201     History  Chief Complaint  Patient presents with   Seizures    JOSHUAJAMES MOEHRING is a 81 y.o. male.  The history is provided by the patient and medical records.  Seizures  81 y.o. 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  Patient from [place] ([facility]) as they were late giving his keppra tonight.  No other issues reported-- no falls, no fevers, vomiting, etc.  No change in mental status.  He does have jaw twitching currently which facility reports is baseline.  Home Medications Prior to Admission medications   Medication Sig Start Date End Date Taking? Authorizing Provider  acetaminophen (TYLENOL) 650 MG CR tablet 650 mg See admin instructions. 650 mg, per tube, every 8 hours as needed for moderate pain    [provider]  ALPRAZolam (XANAX) 0.5 MG tablet Place 1 tablet (0.5 mg total) into feeding tube every 8 (eight) hours as needed for anxiety. 05/31/21   Darliss Cheney, MD  escitalopram (LEXAPRO) 10 MG tablet Place 10 mg into feeding tube daily.    [provider]  esomeprazole (NEXIUM) 40 MG packet 40 mg See admin instructions. 40 mg, per tube, every morning    [provider]  fexofenadine (ALLEGRA) 180 MG tablet Place 180 mg into feeding tube daily.    [provider]  fluticasone (FLONASE) 50 MCG/ACT nasal spray Place 2 sprays into both nostrils in the morning and at bedtime.    [provider]  folic acid (FOLVITE) 1 MG tablet Take 1 tablet (1 mg total) by mouth daily. Patient taking differently: Place 1 mg into feeding tube daily. 09/29/20   [name], MD  furosemide (LASIX) 20 MG tablet Place 1 tablet (20 mg total) into feeding tube  daily as needed for fluid or edema. 04/16/21   Eulogio Bear U, DO  GERI-TUSSIN 100 MG/5ML liquid Place 400 mg into feeding tube 3 (three) times daily.    [provider]  hydrOXYzine (ATARAX) 25 MG tablet Place 25 mg into feeding tube every 6 (six) hours as needed for anxiety.    [provider]  lactulose (CHRONULAC) 10 GM/15ML solution Place 20 g into feeding tube 2 (two) times daily as needed for mild constipation.    [provider]  levalbuterol Penne Lash) 1.25 MG/3ML nebulizer solution Take 1.25 mg by nebulization every 6 (six) hours.    [provider]  levETIRAcetam (KEPPRA) 100 MG/ML solution Place 1,000 mg into feeding tube in the morning and at bedtime.    [provider]  levothyroxine (SYNTHROID) 25 MCG tablet Place 25 mcg into feeding tube daily before breakfast.    [provider]  midodrine (PROAMATINE) 10 MG tablet Place 1 tablet (10 mg total) into feeding tube 3 (three) times daily with meals. 05/01/21   Thurnell Lose, MD  Multiple Vitamin (MULTIVITAMIN WITH MINERALS) TABS tablet Place 1 tablet into feeding tube daily. 05/11/21   Raiford Noble Latif, DO  Nutritional Supplements (FEEDING SUPPLEMENT, JEVITY 1.5 CAL/FIBER,) LIQD Place 1,000 mLs into feeding tube continuous. Patient taking differently: Place 55 mL/hr into feeding tube continuous. 05/10/21   Raiford Noble Latif, DO  Nutritional Supplements (FEEDING SUPPLEMENT, JEVITY 1.5 CAL/FIBER,) LIQD Place 55 mL/hr into feeding tube  continuous.    [provider]  Omega-3 Fatty Acids (SUPER OMEGA 3 EPA/DHA) 1000 MG CAPS Give 1,000 mg by tube daily.    [provider]  ondansetron (ZOFRAN) 4 MG tablet Take 1 tablet (4 mg total) by mouth every 6 (six) hours as needed for nausea. Patient not taking: Reported on 06/04/2021 05/10/21   Raiford Noble Latif, DO  ondansetron (ZOFRAN-ODT) 4 MG disintegrating tablet 4 mg See admin instructions. 4 mg, per tube, every six hours  as needed for nausea with vomiting    [provider]  polyethylene glycol powder (GLYCOLAX/MIRALAX) 17 GM/SCOOP powder Place 17 g into feeding tube See admin instructions. 17 grams, per tube, every 2 days    [provider]  Probiotic Product (CULTURELLE PROBIOTICS PO) Give 1 capsule by tube daily. 10 billion cell    [provider]  propranolol (INDERAL) 10 MG tablet Place 1 tablet (10 mg total) into feeding tube daily as needed (for tremors in AM. do not administer if heart rate is below 60 or systolic blood pressure is below 90.). Patient taking differently: Place 10 mg into feeding tube See admin instructions. 10 mg, per tube, in the morning as needed for tremors and do not administer if heart rate is less than 60 or Systolic B/P is less than 90 05/01/21   Thurnell Lose, MD  sodium chloride (OCEAN) 0.65 % SOLN nasal spray Place 1 spray into both nostrils every 4 (four) hours as needed (for allergic rhinitis).    [provider]  tamsulosin (FLOMAX) 0.4 MG CAPS capsule 0.4 mg See admin instructions. 0.4 mg per tube at bedtime    [provider]  vitamin C (ASCORBIC ACID) 500 MG tablet Place 500 mg into feeding tube daily.    [provider]  zinc gluconate 50 MG tablet Place 50 mg into feeding tube 2 (two) times daily.    [provider]      Allergies    Prednisone and Cortisone    Review of Systems   Review of Systems  Neurological:  Positive for seizures.  All other systems reviewed and are negative.  Physical Exam Updated Vital Signs BP (!) 132/99   Pulse 72   Temp 97.9 F (36.6 C) (Axillary)   Resp (!) 24   Ht 5\' 10"  (1.778 m)   Wt 55 kg   SpO2 100%   BMI 17.40 kg/m   Physical Exam Vitals and nursing note reviewed.  Constitutional:      Appearance: He is well-developed.  HENT:     Head: Normocephalic and atraumatic.  Eyes:     Conjunctiva/sclera: Conjunctivae normal.     Pupils: Pupils are equal, round,  and reactive to light.  Neck:     Comments: Trach in place Cardiovascular:     Rate and Rhythm: Normal rate and regular rhythm.     Heart sounds: Normal heart sounds.  Pulmonary:     Effort: Pulmonary effort is normal. No respiratory distress.     Breath sounds: Normal breath sounds. No rhonchi.     Comments: No distress noted, on baseline vent settings Abdominal:     General: Bowel sounds are normal.     Palpations: Abdomen is soft.     Tenderness: There is no abdominal tenderness. There is no rebound.     Comments: PEG tube present  Genitourinary:    Comments: Indwelling foley Musculoskeletal:        General: Normal range of motion.  Cervical back: Normal range of motion.  Skin:    General: Skin is warm and dry.  Neurological:     Mental Status: He is alert.     Comments: Awake, alert, seemingly oriented to situation; continuous jaw twitching throughout exam but remains responsive and will track people in room during this, no verbal responses given    ED Results / Procedures / Treatments   Labs (all labs ordered are listed, but only abnormal results are displayed) Labs Reviewed  CBC WITH DIFFERENTIAL/PLATELET - Abnormal; Notable for the following components:      Result Value   WBC 16.6 (*)    RBC 3.92 (*)    Hemoglobin 11.4 (*)    HCT 36.3 (*)    RDW 16.3 (*)    Neutro Abs 15.1 (*)    Lymphs Abs 0.4 (*)    Abs Immature Granulocytes 0.09 (*)    All other components within normal limits  COMPREHENSIVE METABOLIC PANEL - Abnormal; Notable for the following components:   Glucose, Bld 162 (*)    Creatinine, Ser 0.56 (*)    Albumin 3.4 (*)    All other components within normal limits  URINALYSIS, ROUTINE W REFLEX MICROSCOPIC - Abnormal; Notable for the following components:   APPearance CLOUDY (*)    pH 9.0 (*)    Leukocytes,Ua LARGE (*)    Bacteria, UA RARE (*)    All other components within normal limits  URINE CULTURE  CULTURE, BLOOD (ROUTINE X 2)  CULTURE,  BLOOD (ROUTINE X 2)  MAGNESIUM  CBC WITH DIFFERENTIAL/PLATELET    EKG EKG Interpretation  Date/Time:  Thursday June 20 2021 22:06:34 EDT Ventricular Rate:  109 PR Interval:  134 QRS Duration: 89 QT Interval:  416 QTC Calculation: 561 R Axis:   216 Text Interpretation: Sinus tachycardia Consider right atrial enlargement Right axis deviation Nonspecific T abnormalities, inferior leads Prolonged QT interval Artifact in lead(s) I II III aVR aVL aVF V1 V2 V3 V4 No significant change since last tracing Confirmed by Wandra Arthurs [phone]) on 06/20/2021 10:19:24 PM  Radiology DG Chest Port 1 View  Result Date: 06/20/2021 CLINICAL DATA:  Seizure. EXAM: PORTABLE CHEST 1 VIEW COMPARISON:  Chest x-ray 06/07/2021 FINDINGS: The tip of the tracheostomy is at the level of the clavicular heads, unchanged. Median sternotomy wires are present. The cardiomediastinal silhouette is within normal limits. There is no focal lung infiltrate, pleural effusion or pneumothorax identified. No acute fractures are seen. IMPRESSION: No active disease. Electronically Signed   By: Ronney Asters M.D.   On: 06/20/2021 23:57    Procedures Procedures    CRITICAL CARE Performed by: Larene Pickett   Total critical care time: 35 minutes  Critical care time was exclusive of separately billable procedures and treating other patients.  Critical care was necessary to treat or prevent imminent or life-threatening deterioration.  Critical care was time spent personally by me on the following activities: development of treatment plan with patient and/or surrogate as well as nursing, discussions with consultants, evaluation of patient's response to treatment, examination of patient, obtaining history from patient or surrogate, ordering and performing treatments and interventions, ordering and review of laboratory studies, ordering and review of radiographic studies, pulse oximetry and re-evaluation of patient's  condition..  Medications Ordered in ED Medications  levETIRAcetam (KEPPRA) IVPB 1000 mg/100 mL premix (has no administration in time range)    ED Course/ Medical Decision Making/ A&P  Medical Decision Making Amount and/or Complexity of Data Reviewed Labs: ordered. Radiology: ordered and independent interpretation performed. ECG/medicine tests: ordered and independent interpretation performed.  Risk Prescription drug management. Decision regarding hospitalization.   81 year old male presenting to the ED from [facility] facility due to "seizures".  They were late giving his nightly Keppra and when so this usually occurs.  No tonic-clonic seizure activity witnessed with [facility] in route.  Patient has ongoing lipsmacking type behavior, however is awake, alert, and responsive during this.  Facility reports this is baseline with his history of Parkinson's.  He does track during exam but does not provide any verbal responses.  Will check labs, given IV keppra load.  CBC with leukocytosis at 16.6.  No significant electrolyte derangement.  Magnesium is normal.  Given his leukocytosis, work-up was broadened to include blood cultures, chest x-ray, UA.  Chest x-ray without any acute findings.  Blood cultures are pending.  UA with rare bacteria, 0-6 WBCs, large leuks.  No nitrates.  Culture is pending.  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  Discussed with pharmacy, only treatment options at this point are linezolid and meropenem.  He was given dose in the ED.  Also consider possible line source as he has indwelling right upper extremity line, however this appears clean without any overt signs of infection on exam.  Case was discussed with hospitalist, Dr. Dinah Beers has evaluated patient at bedside and reviewed work-up from today.  Does not feel he requires admission at this time.  If blood or urine cultures return positive, this can be treated at his  LTAC facility.    6:23 AM IV abx finished.  Remains at baseline.  VSS.  Will discharge back to facility.  Instructions given for staff at facility to follow-up on cultures.  Can return here for new concerns.  Final Clinical Impression(s) / ED Diagnoses Final diagnoses:  Seizure-like activity [facility])    Rx / Ranger Orders ED Discharge Orders     None         Larene Pickett, PA-C 06/21/21 [id number]    Drenda Freeze, MD 06/26/21 1452

## 2021-06-21 DIAGNOSIS — R258 Other abnormal involuntary movements: Secondary | ICD-10-CM | POA: Diagnosis not present

## 2021-06-21 DIAGNOSIS — R569 Unspecified convulsions: Secondary | ICD-10-CM | POA: Diagnosis present

## 2021-06-21 DIAGNOSIS — G20 Parkinson's disease: Secondary | ICD-10-CM | POA: Diagnosis not present

## 2021-06-21 DIAGNOSIS — R000 Tachycardia, unspecified: Secondary | ICD-10-CM | POA: Diagnosis not present

## 2021-06-21 DIAGNOSIS — D72829 Elevated white blood cell count, unspecified: Secondary | ICD-10-CM | POA: Diagnosis not present

## 2021-06-21 DIAGNOSIS — I509 Heart failure, unspecified: Secondary | ICD-10-CM | POA: Diagnosis not present

## 2021-06-21 LAB — URINALYSIS, ROUTINE W REFLEX MICROSCOPIC
Bilirubin Urine: NEGATIVE
Glucose, UA: NEGATIVE mg/dL
Hgb urine dipstick: NEGATIVE
Ketones, ur: NEGATIVE mg/dL
Nitrite: NEGATIVE
Protein, ur: NEGATIVE mg/dL
Specific Gravity, Urine: 1.012 (ref 1.005–1.030)
pH: 9 — ABNORMAL HIGH (ref 5.0–8.0)

## 2021-06-21 MED ORDER — LINEZOLID 600 MG/300ML IV SOLN
600.0000 mg | Freq: Once | INTRAVENOUS | Status: AC
  Administered 2021-06-21: 600 mg via INTRAVENOUS
  Filled 2021-06-21: qty 300

## 2021-06-21 MED ORDER — SODIUM CHLORIDE 0.9 % IV SOLN
1.0000 g | Freq: Once | INTRAVENOUS | Status: AC
  Administered 2021-06-21: 1 g via INTRAVENOUS
  Filled 2021-06-21: qty 20

## 2021-06-21 NOTE — ED Notes (Signed)
Patient's foley bag changed out per PA Sanders to obtain urine sample.

## 2021-06-21 NOTE — ED Notes (Signed)
Patient given warm blanket per request. 

## 2021-06-21 NOTE — ED Notes (Signed)
Report called to [facility]

## 2021-06-21 NOTE — ED Notes (Signed)
Transport set up back to [facility] via [employer] 

## 2021-06-21 NOTE — ED Notes (Signed)
Patient's lights cut off per request.

## 2021-06-21 NOTE — Discharge Instructions (Signed)
Did have white count today of 16.6.  CXR was normal, urine without definitive infection. Blood and urine cultures are pending.  He was given IV abx in the ED today along with IV keppra. Have PCP follow-up in the next 48 hours. Return here for new concerns.

## 2021-06-24 LAB — URINE CULTURE: Culture: >=100000 — AB

## 2021-06-25 ENCOUNTER — Observation Stay (HOSPITAL_COMMUNITY)
Admission: EM | Admit: 2021-06-25 | Discharge: 2021-06-26 | Disposition: A | Discharge status: Skilled Nursing Facility | Payer: Medicare Other | Attending: Student | Admitting: Student

## 2021-06-25 ENCOUNTER — Encounter (HOSPITAL_COMMUNITY): Payer: Self-pay | Admitting: Emergency Medicine

## 2021-06-25 ENCOUNTER — Emergency Department (HOSPITAL_COMMUNITY): Payer: Medicare Other

## 2021-06-25 ENCOUNTER — Inpatient Hospital Stay (HOSPITAL_COMMUNITY): Payer: Medicare Other

## 2021-06-25 ENCOUNTER — Other Ambulatory Visit: Payer: Self-pay

## 2021-06-25 DIAGNOSIS — A419 Sepsis, unspecified organism: Secondary | ICD-10-CM | POA: Insufficient documentation

## 2021-06-25 DIAGNOSIS — J9611 Chronic respiratory failure with hypoxia: Secondary | ICD-10-CM | POA: Insufficient documentation

## 2021-06-25 DIAGNOSIS — T83511A Infection and inflammatory reaction due to indwelling urethral catheter, initial encounter: Secondary | ICD-10-CM | POA: Diagnosis not present

## 2021-06-25 DIAGNOSIS — G20C Parkinsonism, unspecified: Secondary | ICD-10-CM | POA: Diagnosis present

## 2021-06-25 DIAGNOSIS — R6520 Severe sepsis without septic shock: Secondary | ICD-10-CM | POA: Insufficient documentation

## 2021-06-25 DIAGNOSIS — E875 Hyperkalemia: Secondary | ICD-10-CM | POA: Diagnosis present

## 2021-06-25 DIAGNOSIS — Z9911 Dependence on respirator [ventilator] status: Secondary | ICD-10-CM | POA: Diagnosis not present

## 2021-06-25 DIAGNOSIS — G9341 Metabolic encephalopathy: Secondary | ICD-10-CM | POA: Diagnosis not present

## 2021-06-25 DIAGNOSIS — E43 Unspecified severe protein-calorie malnutrition: Secondary | ICD-10-CM | POA: Diagnosis not present

## 2021-06-25 DIAGNOSIS — I9589 Other hypotension: Secondary | ICD-10-CM | POA: Insufficient documentation

## 2021-06-25 DIAGNOSIS — E039 Hypothyroidism, unspecified: Secondary | ICD-10-CM | POA: Diagnosis not present

## 2021-06-25 DIAGNOSIS — I5042 Chronic combined systolic (congestive) and diastolic (congestive) heart failure: Secondary | ICD-10-CM | POA: Diagnosis not present

## 2021-06-25 DIAGNOSIS — I5032 Chronic diastolic (congestive) heart failure: Secondary | ICD-10-CM | POA: Diagnosis not present

## 2021-06-25 DIAGNOSIS — I2510 Atherosclerotic heart disease of native coronary artery without angina pectoris: Secondary | ICD-10-CM | POA: Diagnosis not present

## 2021-06-25 DIAGNOSIS — Y732 Prosthetic and other implants, materials and accessory gastroenterology and urology devices associated with adverse incidents: Secondary | ICD-10-CM | POA: Insufficient documentation

## 2021-06-25 DIAGNOSIS — Z931 Gastrostomy status: Secondary | ICD-10-CM | POA: Diagnosis not present

## 2021-06-25 DIAGNOSIS — R569 Unspecified convulsions: Principal | ICD-10-CM

## 2021-06-25 DIAGNOSIS — G20 Parkinson's disease: Secondary | ICD-10-CM | POA: Insufficient documentation

## 2021-06-25 DIAGNOSIS — Z79899 Other long term (current) drug therapy: Secondary | ICD-10-CM | POA: Insufficient documentation

## 2021-06-25 DIAGNOSIS — I110 Hypertensive heart disease with heart failure: Secondary | ICD-10-CM | POA: Diagnosis not present

## 2021-06-25 DIAGNOSIS — D72829 Elevated white blood cell count, unspecified: Secondary | ICD-10-CM | POA: Diagnosis not present

## 2021-06-25 DIAGNOSIS — N390 Urinary tract infection, site not specified: Secondary | ICD-10-CM | POA: Diagnosis not present

## 2021-06-25 DIAGNOSIS — Z930 Tracheostomy status: Secondary | ICD-10-CM | POA: Diagnosis not present

## 2021-06-25 DIAGNOSIS — I480 Paroxysmal atrial fibrillation: Secondary | ICD-10-CM | POA: Insufficient documentation

## 2021-06-25 DIAGNOSIS — Z86718 Personal history of other venous thrombosis and embolism: Secondary | ICD-10-CM | POA: Insufficient documentation

## 2021-06-25 DIAGNOSIS — R251 Tremor, unspecified: Secondary | ICD-10-CM | POA: Diagnosis present

## 2021-06-25 HISTORY — DX: Tremor, unspecified: R25.1

## 2021-06-25 LAB — URINALYSIS, ROUTINE W REFLEX MICROSCOPIC
Bilirubin Urine: NEGATIVE
Glucose, UA: NEGATIVE mg/dL
Ketones, ur: NEGATIVE mg/dL
Nitrite: NEGATIVE
Protein, ur: 100 mg/dL — AB
Specific Gravity, Urine: 1.018 (ref 1.005–1.030)
WBC, UA: >50 WBC/hpf — ABNORMAL HIGH (ref 0–5)
pH: 5 (ref 5.0–8.0)

## 2021-06-25 LAB — I-STAT VENOUS BLOOD GAS, ED
Acid-Base Excess: 0 mmol/L (ref 0.0–2.0)
Bicarbonate: 26.2 mmol/L (ref 20.0–28.0)
Calcium, Ion: 1.1 mmol/L — ABNORMAL LOW (ref 1.15–1.40)
HCT: 38 % — ABNORMAL LOW (ref 39.0–52.0)
Hemoglobin: 12.9 g/dL — ABNORMAL LOW (ref 13.0–17.0)
O2 Saturation: 78 %
Potassium: 6.2 mmol/L — ABNORMAL HIGH (ref 3.5–5.1)
Sodium: 137 mmol/L (ref 135–145)
TCO2: 28 mmol/L (ref 22–32)
pCO2, Ven: 49 mmHg (ref 44–60)
pH, Ven: 7.337 (ref 7.25–7.43)
pO2, Ven: 46 mmHg — ABNORMAL HIGH (ref 32–45)

## 2021-06-25 LAB — COMPREHENSIVE METABOLIC PANEL
ALT: 42 U/L (ref 0–44)
AST: 31 U/L (ref 15–41)
Albumin: 3.7 g/dL (ref 3.5–5.0)
Alkaline Phosphatase: 143 U/L — ABNORMAL HIGH (ref 38–126)
Anion gap: 7 (ref 5–15)
BUN: 22 mg/dL (ref 8–23)
CO2: 24 mmol/L (ref 22–32)
Calcium: 9.7 mg/dL (ref 8.9–10.3)
Chloride: 107 mmol/L (ref 98–111)
Creatinine, Ser: 0.66 mg/dL (ref 0.61–1.24)
GFR, Estimated: >60 mL/min (ref >60)
Glucose, Bld: 195 mg/dL — ABNORMAL HIGH (ref 70–99)
Potassium: 6.4 mmol/L (ref 3.5–5.1)
Sodium: 138 mmol/L (ref 135–145)
Total Bilirubin: 0.4 mg/dL (ref 0.3–1.2)
Total Protein: 7.9 g/dL (ref 6.5–8.1)

## 2021-06-25 LAB — CBG MONITORING, ED: Glucose-Capillary: 172 mg/dL — ABNORMAL HIGH (ref 70–99)

## 2021-06-25 LAB — GLUCOSE, CAPILLARY
Glucose-Capillary: 106 mg/dL — ABNORMAL HIGH (ref 70–99)
Glucose-Capillary: 114 mg/dL — ABNORMAL HIGH (ref 70–99)
Glucose-Capillary: 118 mg/dL — ABNORMAL HIGH (ref 70–99)
Glucose-Capillary: 120 mg/dL — ABNORMAL HIGH (ref 70–99)

## 2021-06-25 LAB — POTASSIUM: Potassium: 5.9 mmol/L — ABNORMAL HIGH (ref 3.5–5.1)

## 2021-06-25 LAB — C-REACTIVE PROTEIN: CRP: 6 mg/dL — ABNORMAL HIGH (ref <1.0)

## 2021-06-25 LAB — CBC WITH DIFFERENTIAL/PLATELET
Abs Immature Granulocytes: 0.23 10*3/uL — ABNORMAL HIGH (ref 0.00–0.07)
Basophils Absolute: 0 10*3/uL (ref 0.0–0.1)
Basophils Relative: 0 %
Eosinophils Absolute: 0 10*3/uL (ref 0.0–0.5)
Eosinophils Relative: 0 %
HCT: 38.6 % — ABNORMAL LOW (ref 39.0–52.0)
Hemoglobin: 11.5 g/dL — ABNORMAL LOW (ref 13.0–17.0)
Immature Granulocytes: 1 %
Lymphocytes Relative: 2 %
Lymphs Abs: 0.5 10*3/uL — ABNORMAL LOW (ref 0.7–4.0)
MCH: 28.7 pg (ref 26.0–34.0)
MCHC: 29.8 g/dL — ABNORMAL LOW (ref 30.0–36.0)
MCV: 96.3 fL (ref 80.0–100.0)
Monocytes Absolute: 1.1 10*3/uL — ABNORMAL HIGH (ref 0.1–1.0)
Monocytes Relative: 4 %
Neutro Abs: 24.9 10*3/uL — ABNORMAL HIGH (ref 1.7–7.7)
Neutrophils Relative %: 93 %
Platelets: 340 10*3/uL (ref 150–400)
RBC: 4.01 MIL/uL — ABNORMAL LOW (ref 4.22–5.81)
RDW: 16.9 % — ABNORMAL HIGH (ref 11.5–15.5)
WBC: 26.7 10*3/uL — ABNORMAL HIGH (ref 4.0–10.5)
nRBC: 0 % (ref 0.0–0.2)

## 2021-06-25 LAB — LACTIC ACID, PLASMA
Lactic Acid, Venous: 2.5 mmol/L (ref 0.5–1.9)
Lactic Acid, Venous: 1.9 mmol/L (ref 0.5–1.9)

## 2021-06-25 LAB — MRSA NEXT GEN BY PCR, NASAL: MRSA by PCR Next Gen: NOT DETECTED

## 2021-06-25 LAB — MAGNESIUM: Magnesium: 2.5 mg/dL — ABNORMAL HIGH (ref 1.7–2.4)

## 2021-06-25 IMAGING — DX DG CHEST 1V PORT
1 series · 1 of 1 positions shown · non-contrast
Comparison: [DATE].

CLINICAL DATA: Seizures.

EXAM:
PORTABLE CHEST 1 VIEW

[chest]
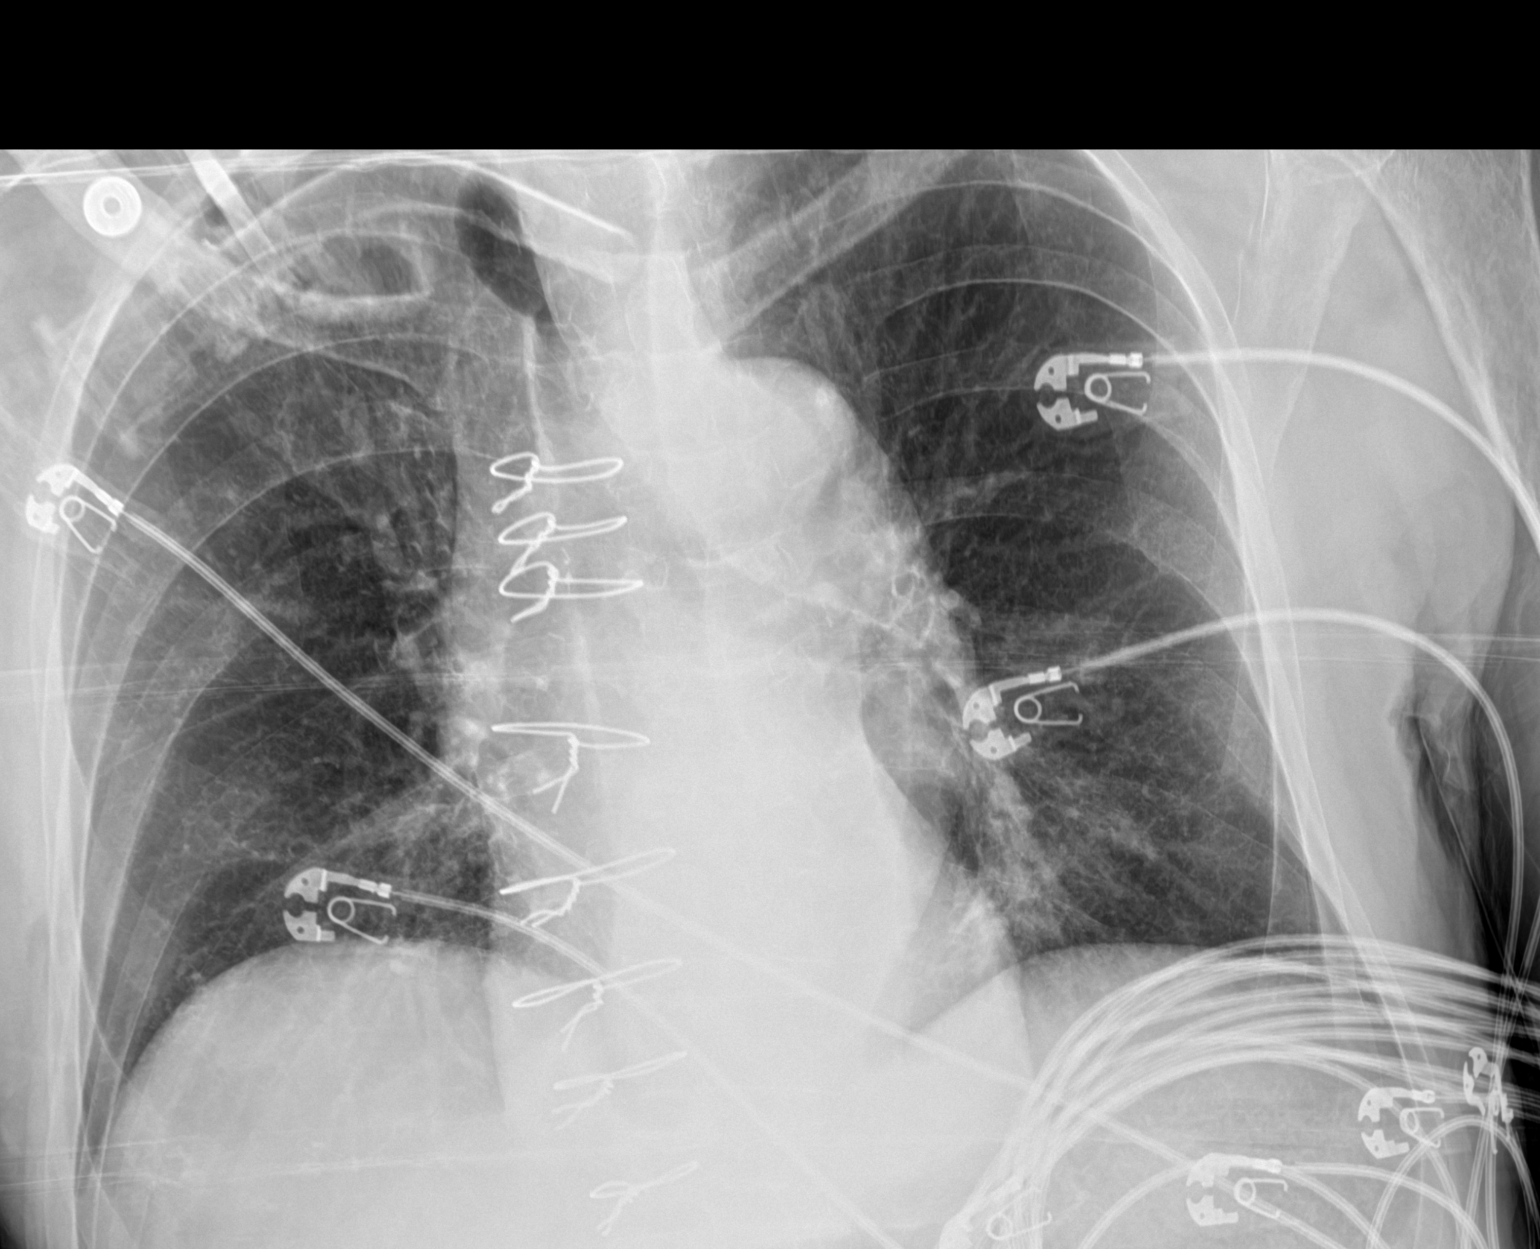

[1 of 1 positions shown; findings below may reference images not displayed]

FINDINGS: The heart size and mediastinal contours are within normal limits.
Lung volumes are low. Mild apical pleural thickening is noted
bilaterally. No consolidation, effusion, or pneumothorax. Evaluation
of the right lung apex is limited due to overlapping structures. A
tracheostomy tube terminates 7.2 cm above the carina. Sternotomy
wires are noted over the midline. No acute osseous abnormality.
IMPRESSION: Lung volumes with no acute process.

## 2021-06-25 IMAGING — DX DG CHEST 1V PORT
1 series · 1 of 1 positions shown · non-contrast
Comparison: Same day radiograph

CLINICAL DATA: Tracheostomy

EXAM:
PORTABLE CHEST 1 VIEW

[chest ap]
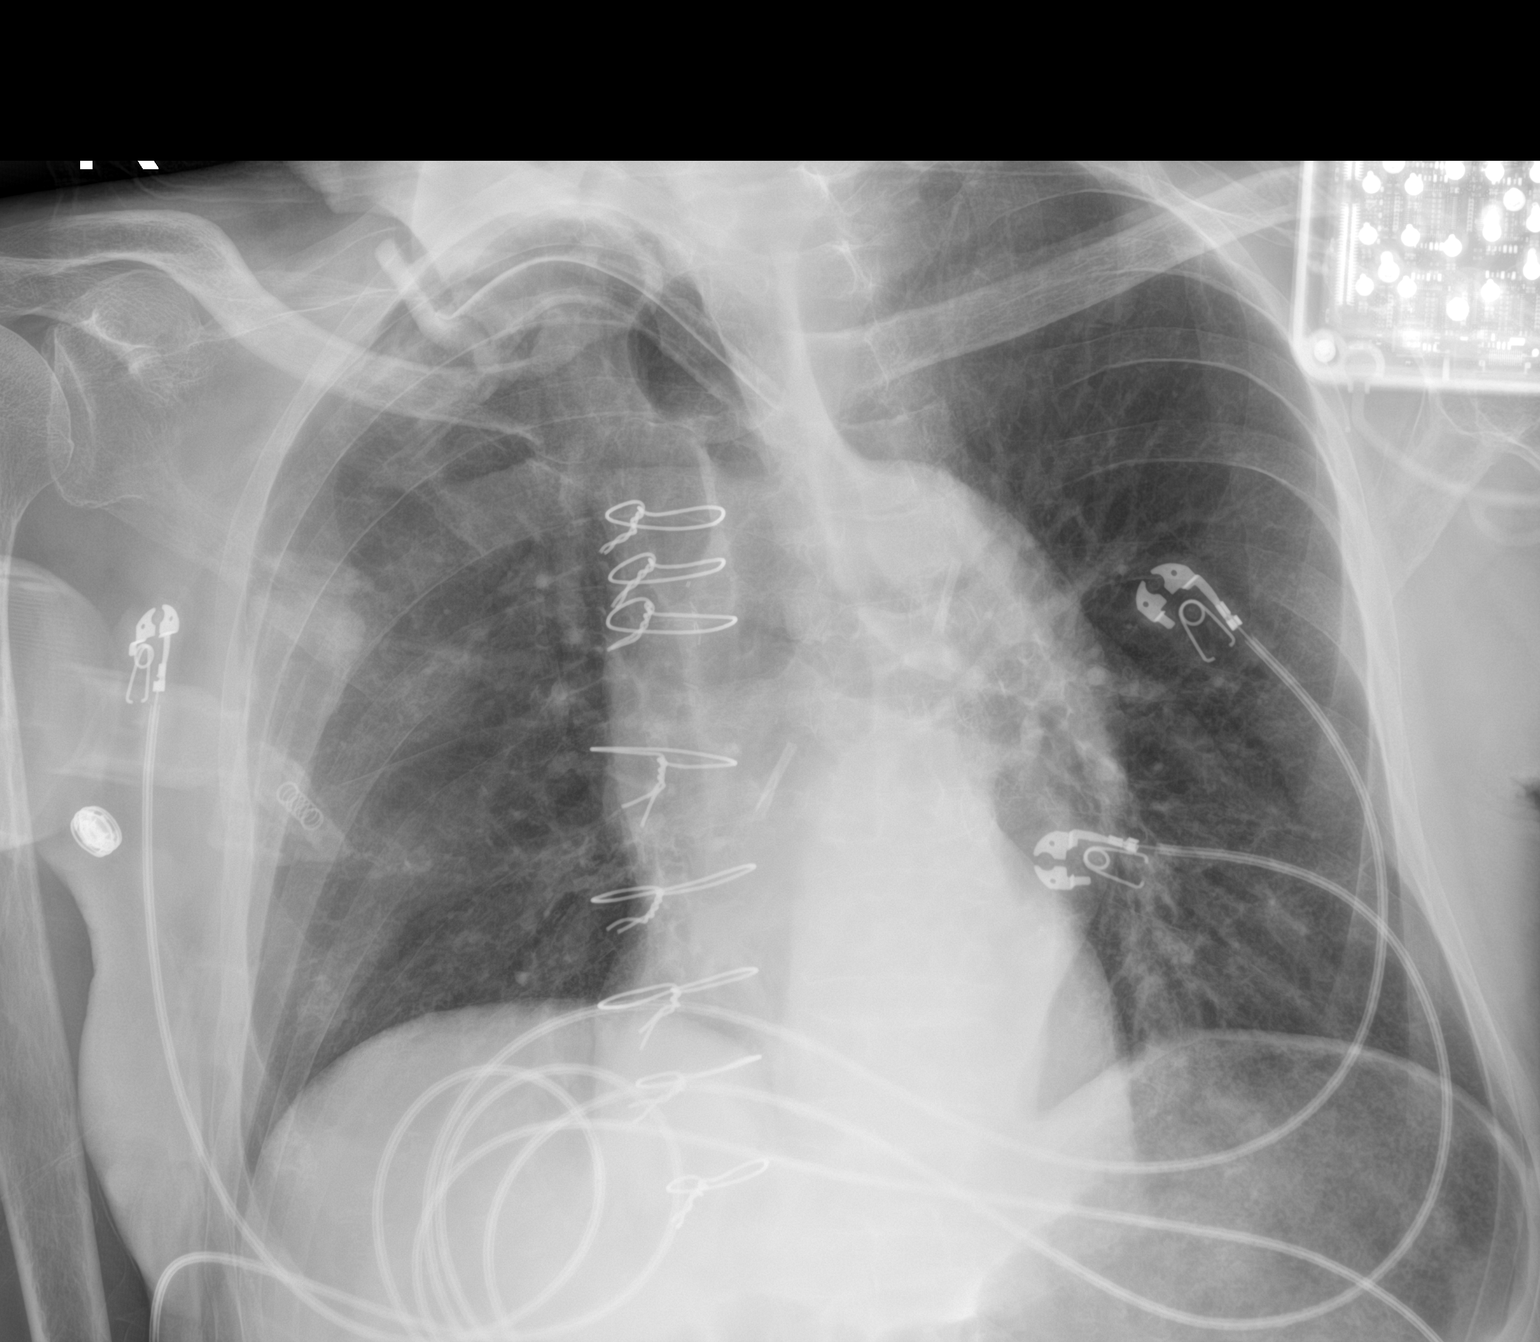

[1 of 1 positions shown; findings below may reference images not displayed]

FINDINGS: Patient is rotated. Tracheostomy tube remains in place. Prior median
sternotomy. Stable heart size. No focal airspace consolidation,
pleural effusion, or pneumothorax.
IMPRESSION: Stable exam.  No acute cardiopulmonary findings.

## 2021-06-25 MED ORDER — PROPRANOLOL HCL 10 MG PO TABS
10.0000 mg | ORAL_TABLET | ORAL | Status: DC
Start: 2021-06-25 — End: 2021-06-25

## 2021-06-25 MED ORDER — LEVALBUTEROL HCL 1.25 MG/0.5ML IN NEBU: 1.2500 mg | INHALATION_SOLUTION | Freq: Four times a day (QID) | RESPIRATORY_TRACT | Status: DC | PRN

## 2021-06-25 MED ORDER — LEVETIRACETAM IN NACL 1000 MG/100ML IV SOLN
1000.0000 mg | Freq: Once | INTRAVENOUS | Status: AC
  Administered 2021-06-25: 1000 mg via INTRAVENOUS
  Filled 2021-06-25: qty 100

## 2021-06-25 MED ORDER — LORAZEPAM 2 MG/ML IJ SOLN
1.0000 mg | Freq: Once | INTRAMUSCULAR | Status: AC
  Administered 2021-06-25: 1 mg via INTRAVENOUS
  Filled 2021-06-25: qty 1

## 2021-06-25 MED ORDER — PROSOURCE TF PO LIQD
45.0000 mL | Freq: Two times a day (BID) | ORAL | Status: DC
  Administered 2021-06-25 – 2021-06-26 (×3): 45 mL
  Filled 2021-06-25 (×3): qty 45

## 2021-06-25 MED ORDER — FOLIC ACID 1 MG PO TABS
1.0000 mg | ORAL_TABLET | Freq: Every day | ORAL | Status: DC
  Administered 2021-06-25 – 2021-06-26 (×2): 1 mg
  Filled 2021-06-25 (×2): qty 1

## 2021-06-25 MED ORDER — LORATADINE 10 MG PO TABS
10.0000 mg | ORAL_TABLET | Freq: Every day | ORAL | Status: DC
  Administered 2021-06-25 – 2021-06-26 (×2): 10 mg
  Filled 2021-06-25 (×2): qty 1

## 2021-06-25 MED ORDER — ONDANSETRON 4 MG PO TBDP: 4.0000 mg | ORAL_TABLET | Freq: Four times a day (QID) | ORAL | Status: DC | PRN

## 2021-06-25 MED ORDER — ADULT MULTIVITAMIN W/MINERALS CH
1.0000 | ORAL_TABLET | Freq: Every day | ORAL | Status: DC
Start: 2021-06-25 — End: 2021-06-26
  Administered 2021-06-25 – 2021-06-26 (×2): 1
  Filled 2021-06-25 (×2): qty 1

## 2021-06-25 MED ORDER — ACETAMINOPHEN 325 MG PO TABS
650.0000 mg | ORAL_TABLET | Freq: Three times a day (TID) | ORAL | Status: DC | PRN
  Administered 2021-06-26: 650 mg
  Filled 2021-06-25: qty 2

## 2021-06-25 MED ORDER — ASCORBIC ACID 500 MG PO TABS
500.0000 mg | ORAL_TABLET | Freq: Every day | ORAL | Status: DC
Start: 2021-06-25 — End: 2021-06-26
  Administered 2021-06-25 – 2021-06-26 (×2): 500 mg
  Filled 2021-06-25 (×2): qty 1

## 2021-06-25 MED ORDER — ORAL CARE MOUTH RINSE
15.0000 mL | OROMUCOSAL | Status: DC
  Administered 2021-06-25 – 2021-06-26 (×8): 15 mL via OROMUCOSAL

## 2021-06-25 MED ORDER — PANTOPRAZOLE 2 MG/ML SUSPENSION
40.0000 mg | Freq: Every day | ORAL | Status: DC
Start: 2021-06-25 — End: 2021-06-26
  Administered 2021-06-25 – 2021-06-26 (×2): 40 mg
  Filled 2021-06-25 (×2): qty 20

## 2021-06-25 MED ORDER — ALPRAZOLAM 0.25 MG PO TABS
0.5000 mg | ORAL_TABLET | Freq: Three times a day (TID) | ORAL | Status: DC | PRN
  Administered 2021-06-25 – 2021-06-26 (×2): 0.5 mg
  Filled 2021-06-25 (×2): qty 2

## 2021-06-25 MED ORDER — POLYETHYLENE GLYCOL 3350 17 G PO PACK
17.0000 g | PACK | ORAL | Status: DC
Start: 2021-06-25 — End: 2021-06-26
  Administered 2021-06-25: 17 g
  Filled 2021-06-25: qty 1

## 2021-06-25 MED ORDER — LEVETIRACETAM IN NACL 1000 MG/100ML IV SOLN
1000.0000 mg | Freq: Two times a day (BID) | INTRAVENOUS | Status: DC
  Administered 2021-06-25 – 2021-06-26 (×3): 1000 mg via INTRAVENOUS
  Filled 2021-06-25 (×3): qty 100

## 2021-06-25 MED ORDER — SODIUM CHLORIDE 0.9 % IV SOLN: INTRAVENOUS | Status: DC

## 2021-06-25 MED ORDER — SODIUM CHLORIDE 0.9 % IV SOLN: INTRAVENOUS | Status: AC

## 2021-06-25 MED ORDER — SODIUM CHLORIDE 0.9 % IV BOLUS
500.0000 mL | Freq: Once | INTRAVENOUS | Status: AC
Start: 2021-06-25 — End: 2021-06-25
  Administered 2021-06-25: 500 mL via INTRAVENOUS

## 2021-06-25 MED ORDER — SODIUM CHLORIDE 0.9 % IV BOLUS
500.0000 mL | Freq: Once | INTRAVENOUS | Status: AC
  Administered 2021-06-25: 500 mL via INTRAVENOUS

## 2021-06-25 MED ORDER — ENOXAPARIN SODIUM 40 MG/0.4ML IJ SOSY
40.0000 mg | PREFILLED_SYRINGE | INTRAMUSCULAR | Status: DC
  Administered 2021-06-25 – 2021-06-26 (×2): 40 mg via SUBCUTANEOUS
  Filled 2021-06-25 (×2): qty 0.4

## 2021-06-25 MED ORDER — SACCHAROMYCES BOULARDII 250 MG PO CAPS
250.0000 mg | ORAL_CAPSULE | Freq: Every day | ORAL | Status: DC
  Administered 2021-06-25 – 2021-06-26 (×2): 250 mg
  Filled 2021-06-25 (×2): qty 1

## 2021-06-25 MED ORDER — SODIUM CHLORIDE 0.9 % IV SOLN
1.0000 g | Freq: Three times a day (TID) | INTRAVENOUS | Status: DC
  Administered 2021-06-25 – 2021-06-26 (×5): 1 g via INTRAVENOUS
  Filled 2021-06-25 (×5): qty 20

## 2021-06-25 MED ORDER — LORAZEPAM 2 MG/ML IJ SOLN
2.0000 mg | INTRAMUSCULAR | Status: DC | PRN
Start: 2021-06-25 — End: 2021-06-26

## 2021-06-25 MED ORDER — PROPRANOLOL HCL 10 MG PO TABS: 10.0000 mg | ORAL_TABLET | Freq: Every day | ORAL | Status: DC | PRN

## 2021-06-25 MED ORDER — SALINE SPRAY 0.65 % NA SOLN
1.0000 | NASAL | Status: DC | PRN
  Filled 2021-06-25: qty 44

## 2021-06-25 MED ORDER — CHLORHEXIDINE GLUCONATE 0.12% ORAL RINSE (MEDLINE KIT)
15.0000 mL | Freq: Two times a day (BID) | OROMUCOSAL | Status: DC
  Administered 2021-06-25 – 2021-06-26 (×2): 15 mL via OROMUCOSAL

## 2021-06-25 MED ORDER — LORAZEPAM 2 MG/ML IJ SOLN: 2.0000 mg | Freq: Once | INTRAMUSCULAR | Status: AC

## 2021-06-25 MED ORDER — ESCITALOPRAM OXALATE 10 MG PO TABS
10.0000 mg | ORAL_TABLET | Freq: Every day | ORAL | Status: DC
Start: 2021-06-25 — End: 2021-06-26
  Administered 2021-06-25 – 2021-06-26 (×2): 10 mg
  Filled 2021-06-25 (×2): qty 1

## 2021-06-25 MED ORDER — LEVOTHYROXINE SODIUM 25 MCG PO TABS
25.0000 ug | ORAL_TABLET | Freq: Every day | ORAL | Status: DC
Start: 2021-06-25 — End: 2021-06-26
  Administered 2021-06-25 – 2021-06-26 (×2): 25 ug
  Filled 2021-06-25 (×2): qty 1

## 2021-06-25 MED ORDER — GUAIFENESIN 100 MG/5ML PO LIQD
400.0000 mg | Freq: Three times a day (TID) | ORAL | Status: DC
  Administered 2021-06-25 – 2021-06-26 (×5): 400 mg
  Filled 2021-06-25: qty 30
  Filled 2021-06-25 (×3): qty 15

## 2021-06-25 MED ORDER — MIDODRINE HCL 5 MG PO TABS
10.0000 mg | ORAL_TABLET | Freq: Three times a day (TID) | ORAL | Status: DC
  Administered 2021-06-25 – 2021-06-26 (×5): 10 mg
  Filled 2021-06-25 (×4): qty 2

## 2021-06-25 MED ORDER — CHLORHEXIDINE GLUCONATE CLOTH 2 % EX PADS
6.0000 | MEDICATED_PAD | Freq: Every day | CUTANEOUS | Status: DC
  Administered 2021-06-25: 6 via TOPICAL

## 2021-06-25 MED ORDER — TAMSULOSIN HCL 0.4 MG PO CAPS: 0.4000 mg | ORAL_CAPSULE | ORAL | Status: DC

## 2021-06-25 MED ORDER — JEVITY 1.5 CAL/FIBER PO LIQD
55.0000 mL/h | ORAL | Status: DC
  Administered 2021-06-25 – 2021-06-26 (×2): 55 mL/h
  Filled 2021-06-25: qty 1000
  Filled 2021-06-25 (×2): qty 237
  Filled 2021-06-25 (×3): qty 1000

## 2021-06-25 MED ORDER — LACTULOSE 10 GM/15ML PO SOLN: 20.0000 g | Freq: Two times a day (BID) | ORAL | Status: DC | PRN

## 2021-06-25 MED ORDER — HYDROXYZINE HCL 25 MG PO TABS: 25.0000 mg | ORAL_TABLET | Freq: Four times a day (QID) | ORAL | Status: DC | PRN

## 2021-06-25 MED ORDER — FLUTICASONE PROPIONATE 50 MCG/ACT NA SUSP: 2.0000 | Freq: Two times a day (BID) | NASAL | Status: DC

## 2021-06-25 MED ORDER — LORAZEPAM 2 MG/ML IJ SOLN
INTRAMUSCULAR | Status: AC
  Administered 2021-06-25: 2 mg via INTRAVENOUS
  Filled 2021-06-25: qty 1

## 2021-06-25 MED ORDER — SODIUM CHLORIDE 0.9% FLUSH
3.0000 mL | Freq: Two times a day (BID) | INTRAVENOUS | Status: DC
  Administered 2021-06-25 – 2021-06-26 (×2): 3 mL via INTRAVENOUS

## 2021-06-25 NOTE — ED Notes (Signed)
EDP notified on abnormal blood test result.

## 2021-06-25 NOTE — Progress Notes (Signed)
EEG complete - results pending 

## 2021-06-25 NOTE — Procedures (Signed)
Patient Name: Austin Davis  MRN: [id number]  Epilepsy Attending: Lora Havens  Referring Physician/Provider: Donnetta Simpers, MD Date: 06/25/2021 Duration: 29.45 mins  Patient history: 81 y.o. male with PMH significant for tremor, paroxysmal atrial fibrillation not on anticoagulation, presumed seizures on Keppra 1000mg  BID who presents from his LTAC with 2 episodes of seizures with R gaze deviation. EEG to evaluate for seizure  Level of alertness: Awake, asleep  AEDs during EEG study: LEV  Technical aspects: This EEG study was done with scalp electrodes positioned according to the 10-20 International system of electrode placement. Electrical activity was acquired at a sampling rate of 500Hz  and reviewed with a high frequency filter of 70Hz  and a low frequency filter of 1Hz . EEG data were recorded continuously and digitally stored.   Description: The posterior dominant rhythm consists of 8-9 Hz activity of moderate voltage (25-35 uV) seen predominantly in posterior head regions, symmetric and reactive to eye opening and eye closing. Sleep was characterized by vertex waves, sleep spindles (12 to 14 Hz), maximal frontocentral region.    Patient was noted to have near continuous lower jaw tremor when patient was awake.  Concomitant EEG before, during and after the event did not show any EEG changes suggest seizure.  Hyperventilation and photic stimulation were not performed.     IMPRESSION: This study is within normal limits. No seizures or epileptiform discharges were seen throughout the recording.  Patient was noted to have near continuous lower jaw tremor when patient was awake without concomitant EEG change.  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  Jataya Wann Barbra Sarks

## 2021-06-25 NOTE — H&P (Signed)
History and Physical    Patient: Austin Davis:[id number] DOB: July 17, 1940 DOA: 06/25/2021 DOS: the patient was seen and examined on 06/25/2021 PCP: Aaron Edelman, MD  Patient coming from: [facility] via EMS  Chief Complaint:  Chief Complaint  Patient presents with   Seizures    [facility]   HPI: Austin Davis is a 81 y.o. 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  First episode night at 2100 lasted approximately 20 minutes and had a reoccurrence at 0200 this morning with her turning to the right and right gaze deviation.  It was documented that patient is normally alert and oriented and able to follow commands.  Patient had last been seen in the ED on 6/1 due to having seizure-like activity and urinalysis at that time grew out Enterobacter aerogenes and Pseudomonas aeruginosa which appear to have been sensitive to Zosyn and imipenem.  He had been given a dose of meropenem, but ultimately was discharged back to the [facility] as it was felt to be able to be treated at the [facility] if needed.  Patient has been seen in the ED or admitted into the hospital several times over the last couple of months.  On admission into the emergency department patient was noted to be hypothermic temperature 95.4 F, respirations 14-28, and O2 saturation currently maintained on the vent with 60% FiO2.  On ED patient temporarily had a drop in O2 saturations into the 60s however patient was taken off the vent and suctioned.  O2 saturations improved after patient was bagged and he  was able to be placed back on the vent and was thought secondary to mucous plugging.  Labs significant for WBC 26.7, hemoglobin 11.5, potassium 6.4, glucose 195, alkaline phosphatase 145, and lactic acid 2.5.  Chest x-ray noted low lung volumes without acute process appreciated.  Urinalysis noted large leukocytes, many bacteria, greater than 50 WBCs.  Blood and urine cultures were sent.  Neurology had evaluated patient and initial EEG did not note any seizure-like activity, but review of records note prior EEGs had not shown seizure activity.  Patient has been loaded with 1000 mg of Keppra, given total of 3 mg of Ativan IV, 1 L normal saline IV fluids, and started on meropenem.   Review of Systems: unable to review all systems due to the inability of the patient to answer questions. Past Medical History:  Diagnosis Date   CHF (congestive heart failure) (HCC)    Coronary artery disease    Essential tremor    GI bleed 2019   hospitalized at [facility] for one week   Headache    since childhood   Low blood sugar    since childhood, controlled by diet   Mitral regurgitation    Mitral valve prolapse    Osteopenia 2021   Paroxysmal atrial fibrillation [facility])    Past Surgical History:  Procedure Laterality Date   COLONOSCOPY WITH PROPOFOL N/A 02/19/2017   Procedure: COLONOSCOPY WITH PROPOFOL;  Surgeon: Wilford Corner, MD;  Location: [facility];  Service: Endoscopy;  Laterality: N/A;   IR GASTROSTOMY TUBE MOD SED  10/18/2020  IR IVC FILTER PLMT / S&I /IMG GUID/MOD SED  11/01/2020   laser eye surgery for retina detachment     MITRAL VALVE REPAIR  01/2003   monitor  02/05/2006   polyp removal     TONSILLECTOMY     tooth removal     as a teenager   TRACHEOSTOMY TUBE PLACEMENT N/A 10/26/2020   Procedure: TRACHEOSTOMY;  Surgeon: Rozetta Nunnery, MD;  Location: [facility];  Service: ENT;  Laterality: N/A;   Social History:  reports that he has never smoked. He has never used smokeless  tobacco. He reports current alcohol use. He reports that he does not use drugs.  Allergies  Allergen Reactions   Prednisone Other (See Comments)    Makes "skin crawl," rapid HR- "Allergic," per MAR   Cortisone Palpitations and Other (See Comments)    "Allergic," per [facility]    Family History  Problem Relation Age of Onset   Heart disease Mother    CAD Mother    Migraines Mother    Heart failure Father    Skin cancer Father    Valvular heart disease Father        prolapsed valve   Tremor Father        "probably had a bit of tremor in his old age"   Heart failure Maternal Grandmother    Pneumonia Maternal Grandfather    Heart failure Paternal Grandmother    Stroke Paternal Grandfather    Asthma Daughter    Epilepsy Son    Parkinson's disease Neg Hx     Prior to Admission medications   Medication Sig Start Date End Date Taking? Authorizing Provider  acetaminophen (TYLENOL) 650 MG CR tablet 650 mg See admin instructions. 650 mg, per tube, every 8 hours as needed for moderate pain   Yes [provider]  ALPRAZolam (XANAX) 0.5 MG tablet Place 1 tablet (0.5 mg total) into feeding tube every 8 (eight) hours as needed for anxiety. 05/31/21  Yes Pahwani, Einar Grad, MD  escitalopram (LEXAPRO) 10 MG tablet Place 10 mg into feeding tube daily.   Yes [provider]  esomeprazole (NEXIUM) 40 MG packet 40 mg See admin instructions. 40 mg, per tube, every morning   Yes [provider]  fexofenadine (ALLEGRA) 180 MG tablet Place 180 mg into feeding tube daily.   Yes [provider]  fluticasone (FLONASE) 50 MCG/ACT nasal spray Place 2 sprays into both nostrils in the morning and at bedtime.   Yes [provider]  folic acid (FOLVITE) 1 MG tablet Take 1 tablet (1 mg total) by mouth daily. Patient taking differently: Place 1 mg into feeding tube daily. 09/29/20  Yes [name], MD  furosemide (LASIX) 20 MG tablet Place 1 tablet (20 mg total) into  feeding tube daily as needed for fluid or edema. 04/16/21  Yes Vann, Jessica U, DO  GERI-TUSSIN 100 MG/5ML liquid Place 400 mg into feeding tube 3 (three) times daily.   Yes [provider]  hydrOXYzine (ATARAX) 25 MG tablet Place 25 mg into feeding tube every 6 (six) hours as needed for anxiety.   Yes [provider]  lactulose (CHRONULAC) 10 GM/15ML solution Place 20 g into feeding tube 2 (two) times daily as needed for mild constipation.   Yes [provider]  levalbuterol (XOPENEX) 1.25 MG/3ML nebulizer solution Take 1.25 mg by nebulization every 6 (six) hours.   Yes [provider]  levETIRAcetam (KEPPRA) 100 MG/ML solution Place 1,000 mg into feeding  tube in the morning and at bedtime.   Yes [provider]  levothyroxine (SYNTHROID) 25 MCG tablet Place 25 mcg into feeding tube daily before breakfast.   Yes [provider]  midodrine (PROAMATINE) 10 MG tablet Place 1 tablet (10 mg total) into feeding tube 3 (three) times daily with meals. 05/01/21  Yes Thurnell Lose, MD  Multiple Vitamin (MULTIVITAMIN WITH MINERALS) TABS tablet Place 1 tablet into feeding tube daily. 05/11/21  Yes Sheikh, Omair Latif, DO  Nutritional Supplements (FEEDING SUPPLEMENT, JEVITY 1.5 CAL/FIBER,) LIQD Place 1,000 mLs into feeding tube continuous. Patient taking differently: Place 55 mL/hr into feeding tube continuous. 05/10/21  Yes Sheikh, Omair Latif, DO  Nutritional Supplements (FEEDING SUPPLEMENT, JEVITY 1.5 CAL/FIBER,) LIQD Place 55 mL/hr into feeding tube continuous.   Yes [provider]  Omega-3 Fatty Acids (SUPER OMEGA 3 EPA/DHA) 1000 MG CAPS Give 1,000 mg by tube daily.   Yes [provider]  ondansetron (ZOFRAN-ODT) 4 MG disintegrating tablet 4 mg See admin instructions. 4 mg, per tube, every six hours as needed for nausea with vomiting   Yes [provider]  polyethylene glycol powder (GLYCOLAX/MIRALAX) 17 GM/SCOOP powder Place 17  g into feeding tube See admin instructions. 17 grams, per tube, every 2 days   Yes [provider]  Probiotic Product (CULTURELLE PROBIOTICS PO) Give 1 capsule by tube daily. 10 billion cell   Yes [provider]  propranolol (INDERAL) 10 MG tablet Place 1 tablet (10 mg total) into feeding tube daily as needed (for tremors in AM. do not administer if heart rate is below 60 or systolic blood pressure is below 90.). Patient taking differently: Place 10 mg into feeding tube See admin instructions. 10 mg, per tube, in the morning as needed for tremors and do not administer if heart rate is less than 60 or Systolic B/P is less than 90 05/01/21  Yes Thurnell Lose, MD  sodium chloride (OCEAN) 0.65 % SOLN nasal spray Place 1 spray into both nostrils every 4 (four) hours as needed (for allergic rhinitis).   Yes [provider]  tamsulosin (FLOMAX) 0.4 MG CAPS capsule 0.4 mg See admin instructions. 0.4 mg per tube at bedtime   Yes [provider]  vitamin C (ASCORBIC ACID) 500 MG tablet Place 500 mg into feeding tube daily.   Yes [provider]  zinc gluconate 50 MG tablet Place 50 mg into feeding tube 2 (two) times daily.   Yes [provider]  ondansetron (ZOFRAN) 4 MG tablet Take 1 tablet (4 mg total) by mouth every 6 (six) hours as needed for nausea. Patient not taking: Reported on 06/04/2021 05/10/21   Raiford Noble Como, DO    Physical Exam: Vitals:   06/25/21 0514 06/25/21 0530 06/25/21 0545 06/25/21 0640  BP:  107/62 106/88 (!) 141/113  Pulse:  77 68 (!) 58  Resp:  16 14 14   Temp: (!) 95.4 F (35.2 C)     TempSrc: Rectal     SpO2:  100% 100% 100%   Constitutional: Frail elderly male who is not able to follow commands at this time Eyes: Eyes initially closed, but opened them to stimuli.  Lids and conjunctivae normal.  Rightward gaze preference. ENMT: Mucous membranes are dry.  Dried blood noted on the bottom of the upper teeth.  Patient  making repetitive mouth movements Neck: Head turn to the right side Respiratory: Patient currently on vent with coarse breath sounds.  No significant wheezes or rhonchi  appreciated. Cardiovascular: Regular rate and rhythm, no murmurs / rubs / gallops. No extremity edema. 2+ pedal pulses. No carotid bruits.  Abdomen: Soft nontender with G-tube in place. Musculoskeletal: Muscle wasting appreciated. Skin: no rashes, lesions, ulcers. No induration Neurologic: Unable to assess. Psychiatric: Unable to assess at this time as patient has not responding appropriately.  Data Reviewed:  EKG reveals sinus rhythm at 64 bpm.  Assessment and Plan: Breakthrough seizures Acute metabolic encephalopathy Patient presents after having reported to have 2 seizures prior to arrival.  Patient has been loaded with Keppra 1000 mg IV in the ED.  Neurology evaluated and recommended continuation of current regimen. At baseline patient is able to communicate with by pointing and mouthing words. Currently,  patient unable to do -Admit to medical ICU -Neurochecks -Seizure precautions and monitoring -Continuous EEG -Continue Keppra 1000 mg twice daily -Appreciate neurology consultative services, we will follow-up for further recommendations  SIRS/sepsis secondary to urinary tract infection associated with indwelling urethral catheter On admission patient was found to be hypothermic 95.4 F with WBC 26.7 with initial lactic acid of 2.5.  Chest x-ray was otherwise noted to be clear.  Urinalysis appears chronically she will concern for infection.  Previous urine cultures from 6/2 grew out Enterobacter aerogenes and Pseudomonas aeruginosa which appear to have been sensitive to Zosyn and imipenem.  Patient has been started on meropenem after blood cultures have been obtained.  SIRS criteria ould be related tp -Follow-up blood and urine culture -Add on CRP -Continue meropenem per pharmacy -Gentle IV fluids -Trend lactic acid  level  Chronic chronic respiratory failure with hypoxia (HCC) S/p tracheostomy and chronically vent dependent Patient had episode where O2 saturations dropped into the 60s while in the ED.  He was taken off of the vent and suctioned with subsequent improvement in O2 saturations after being bagged temporarily.  Thought secondary to mucous plugging. -Appreciate PCCM consultative services for management  Hyperkalemia Acute. Potassium 6.4 and repeat 5.9. Possibly secondary to seizure activity. -continue IVFs  -continue to monitor and treat as needed/  Chronic hypotension Blood pressures currently stable. -Midodrine 10 mg 3 times daily per tube  Combined systolic and diastolic CHF Chronic.  Last EF noted to be 45 to 50% with grade 1 diastolic dysfunction and 03/3293.  Patient does not appear grossly fluid overloaded at this time. -Strict I&O's and daily weights  Gastrostomy tube dependent ([place]) -Continue meds and Jevity 1.5 cal/fiber per tube.  Hypothyroidism -Continue levothyroxine   Parkinsonism  Tremors -Continue propranolol daily as needed for tremors with hold parameters  Depression with anxiety -Continue home Lexapro, hydroxyzine as needed, and Xanax as needed  History of DVT  PAF not on anticoagulation Patient with prior history of GI bleed for which patient DVT prophylaxis  DVT prophylaxis: Lovenox Advance Care Planning:   Code Status: Partial Code   Consults: PCCM  Family Communication: Daughter updated over the phone   Severity of Illness: The appropriate patient status for this patient is INPATIENT. Inpatient status is judged to be reasonable and necessary in order to provide the required intensity of service to ensure the patient's safety. The patient's presenting symptoms, physical exam findings, and initial radiographic and laboratory data in the context of their chronic comorbidities is felt to place them at high risk for further clinical deterioration.  Furthermore, it is not anticipated that the patient will be medically stable for discharge from the hospital within 2 midnights of admission.   * I certify that at the point of  admission it is my clinical judgment that the patient will require inpatient hospital care spanning beyond 2 midnights from the point of admission due to high intensity of service, high risk for further deterioration and high frequency of surveillance required.*  Author: Norval Morton, MD 06/25/2021 7:13 AM  For on call review [email or web address].

## 2021-06-25 NOTE — Progress Notes (Signed)
Pt admitted to [facility] on vent. Cannot obtain temp, bair hugger applied. Per Dr Tamala Julian, CCM has been consulted for orders.

## 2021-06-25 NOTE — Progress Notes (Signed)
Pharmacy Antibiotic Note  Austin Davis is a 81 y.o. male admitted on 06/25/2021 with  ESBL infection .  Pharmacy has been consulted for meropenem dosing.  Plan: Start meropenem 1g IV q8h Monitor WBC, temp, SCr, and clinical s/sx of infection daily.  Continue to monitor for seizures F/u cultures and de-escalate if appropriate    Temp (24hrs), Avg:95.4 F (35.2 C), Min:95.4 F (35.2 C), Max:95.4 F (35.2 C)  Recent Labs  Lab 06/20/21 2245 06/20/21 2304 06/25/21 0435 06/25/21 0605  WBC 16.6*  --  26.7*  --   CREATININE  --  0.56* 0.66  --   LATICACIDVEN  --   --   --  2.5*    Estimated Creatinine Clearance: 57.3 mL/min (by C-G formula based on SCr of 0.66 mg/dL).    Allergies  Allergen Reactions   Prednisone Other (See Comments)    Makes "skin crawl," rapid HR- "Allergic," per [facility]   Cortisone Palpitations and Other (See Comments)    "Allergic," per MAR    Antimicrobials this admission: Meropenem 6/6 >>   Dose adjustments this admission: N/A  Microbiology results: 6/6 BCx: pending 6/6 UCx: pending  6/2 Ucx (previous admission): Enterobacter aerogenes (ESBL - S: imipenem, pip/tazo), Pseudomonas aeruginosa (S: cefepime, ceftazidime, cipro, gent, imipenem, pip/tazo)   Thank you for allowing pharmacy to be a part of this patient's care.  Kaleen Mask 06/25/2021 7:31 AM

## 2021-06-25 NOTE — ED Provider Notes (Signed)
[facility] EMERGENCY DEPARTMENT Provider Note   CSN: [id number] Arrival date & time: 06/25/21  0334     History  Chief Complaint  Patient presents with   Seizures    [facility]    Austin Davis is a 81 y.o. male.  The history is provided by the EMS personnel and medical records.  Seizures Austin Davis is a 81 y.o. male who presents to the Emergency Department complaining of seizure.  Level 5 caveat due to altered mental status.  History is provided by EMS, medical records and nursing home staff.  He is a resident of [facility].  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  Staff reports that at 9 PM he had seizure activity for about 20 minutes and then returned to his baseline.  At that time they called EMS but they canceled the call.  At 2 AM he had a second seizure that did not stop and EMS was called again.  Staff reports that his seizures described as staring into space and a mild tremble.  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  At baseline he can interact and mouth words but is very difficult to understand and he is oriented x3. Staff reports no recent illnesses.  No fevers, vomiting complaints of pain or other concerns.  They do report that he has experienced increased seizure activity recently.      Home Medications Prior to Admission medications   Medication Sig Start Date End Date Taking? Authorizing Provider  acetaminophen (TYLENOL) 650 MG CR tablet 650 mg See admin instructions. 650 mg, per tube, every 8 hours as needed for moderate pain   Yes [provider]  ALPRAZolam (XANAX) 0.5 MG tablet Place 1 tablet (0.5 mg total) into feeding tube every 8 (eight) hours as needed for anxiety. 05/31/21  Yes Pahwani, Einar Grad, MD  escitalopram (LEXAPRO) 10 MG tablet Place 10 mg into feeding tube daily.   Yes [provider]  esomeprazole (NEXIUM) 40 MG packet 40 mg See  admin instructions. 40 mg, per tube, every morning   Yes [provider]  fexofenadine (ALLEGRA) 180 MG tablet Place 180 mg into feeding tube daily.   Yes [provider]  fluticasone (FLONASE) 50 MCG/ACT nasal spray Place 2 sprays into both nostrils in the morning and at bedtime.   Yes [provider]  folic acid (FOLVITE) 1 MG tablet Take 1 tablet (1 mg total) by mouth daily. Patient taking differently: Place 1 mg into feeding tube daily. 09/29/20  Yes [name], MD  furosemide (LASIX) 20 MG tablet Place 1 tablet (20 mg total) into feeding tube daily as needed for fluid or edema. 04/16/21  Yes Vann, Jessica U, DO  GERI-TUSSIN 100 MG/5ML liquid Place 400 mg into feeding tube 3 (three) times daily.   Yes [provider]  hydrOXYzine (ATARAX) 25 MG tablet Place 25 mg into feeding tube every 6 (six) hours as needed for anxiety.   Yes [provider]  lactulose (CHRONULAC) 10 GM/15ML solution Place 20 g into feeding tube 2 (two) times daily as needed for mild constipation.   Yes [provider]  levalbuterol (XOPENEX) 1.25 MG/3ML nebulizer solution Take 1.25 mg by nebulization every 6 (six) hours.   Yes [provider]  levETIRAcetam (KEPPRA) 100 MG/ML solution Place 1,000 mg into feeding tube in the morning and at bedtime.   Yes [provider]  levothyroxine (SYNTHROID) 25 MCG tablet Place 25 mcg into feeding tube daily before breakfast.   Yes [provider]  midodrine (PROAMATINE) 10 MG tablet Place 1 tablet (10 mg total) into feeding tube 3 (three) times daily with meals. 05/01/21  Yes Thurnell Lose, MD  Multiple Vitamin (MULTIVITAMIN WITH MINERALS) TABS tablet Place 1 tablet into feeding tube daily. 05/11/21  Yes Sheikh, Omair Latif, DO  Nutritional Supplements (FEEDING SUPPLEMENT, JEVITY 1.5 CAL/FIBER,) LIQD Place 1,000 mLs into feeding tube continuous. Patient taking differently: Place 55 mL/hr into  feeding tube continuous. 05/10/21  Yes Sheikh, Omair Latif, DO  Nutritional Supplements (FEEDING SUPPLEMENT, JEVITY 1.5 CAL/FIBER,) LIQD Place 55 mL/hr into feeding tube continuous.   Yes [provider]  Omega-3 Fatty Acids (SUPER OMEGA 3 EPA/DHA) 1000 MG CAPS Give 1,000 mg by tube daily.   Yes [provider]  ondansetron (ZOFRAN-ODT) 4 MG disintegrating tablet 4 mg See admin instructions. 4 mg, per tube, every six hours as needed for nausea with vomiting   Yes [provider]  polyethylene glycol powder (GLYCOLAX/MIRALAX) 17 GM/SCOOP powder Place 17 g into feeding tube See admin instructions. 17 grams, per tube, every 2 days   Yes [provider]  Probiotic Product (CULTURELLE PROBIOTICS PO) Give 1 capsule by tube daily. 10 billion cell   Yes [provider]  propranolol (INDERAL) 10 MG tablet Place 1 tablet (10 mg total) into feeding tube daily as needed (for tremors in AM. do not administer if heart rate is below 60 or systolic blood pressure is below 90.). Patient taking differently: Place 10 mg into feeding tube See admin instructions. 10 mg, per tube, in the morning as needed for tremors and do not administer if heart rate is less than 60 or Systolic B/P is less than 90 05/01/21  Yes Thurnell Lose, MD  sodium chloride (OCEAN) 0.65 % SOLN nasal spray Place 1 spray into both nostrils every 4 (four) hours as needed (for allergic rhinitis).   Yes [provider]  tamsulosin (FLOMAX) 0.4 MG CAPS capsule 0.4 mg See admin instructions. 0.4 mg per tube at bedtime   Yes [provider]  vitamin C (ASCORBIC ACID) 500 MG tablet Place 500 mg into feeding tube daily.   Yes [provider]  zinc gluconate 50 MG tablet Place 50 mg into feeding tube 2 (two) times daily.   Yes [provider]  ondansetron (ZOFRAN) 4 MG tablet Take 1 tablet (4 mg total) by mouth every 6 (six) hours as needed for nausea. Patient not taking: Reported  on 06/04/2021 05/10/21   Austin Noble Latif, DO      Allergies    Prednisone and Cortisone    Review of Systems   Review of Systems  Unable to perform ROS: Patient unresponsive  Neurological:  Positive for seizures.   Physical Exam Updated Vital Signs BP (!) 75/62   Pulse (!) 58   Temp (!) 97.1 F (36.2 C) (Temporal)   Resp 13   SpO2 100%  Physical Exam Vitals and nursing note reviewed.  Constitutional:      Appearance: He is well-developed.     Comments: Unresponsive, chronically ill-appearing  HENT:     Head: Normocephalic and atraumatic.  Neck:     Comments: Tracheostomy in place Cardiovascular:     Rate and Rhythm: Normal rate and regular rhythm.     Heart sounds: No murmur heard. Pulmonary:     Effort: Pulmonary effort is normal. No  respiratory distress.     Comments: Good air movement bilaterally Abdominal:     Palpations: Abdomen is soft.     Tenderness: There is no abdominal tenderness. There is no guarding or rebound.     Comments: Gastrostomy tube in the left upper quadrant  Genitourinary:    Comments: Foley catheter with cloudy yellow urine in the bag Musculoskeletal:        General: No swelling or tenderness.  Skin:    General: Skin is dry.     Comments: Cool skin  Neurological:     Comments: GCS 1-1-1.  Head and eyes deviated to the right.  There is a mild tremor to the right upper extremity.  No response to pain.  Psychiatric:     Comments: Unable to assess    ED Results / Procedures / Treatments   Labs (all labs ordered are listed, but only abnormal results are displayed) Labs Reviewed  COMPREHENSIVE METABOLIC PANEL - Abnormal; Notable for the following components:      Result Value   Potassium 6.4 (*)    Glucose, Bld 195 (*)    Alkaline Phosphatase 143 (*)    All other components within normal limits  CBC WITH DIFFERENTIAL/PLATELET - Abnormal; Notable for the following components:   WBC 26.7 (*)    RBC 4.01 (*)    Hemoglobin 11.5 (*)     HCT 38.6 (*)    MCHC 29.8 (*)    RDW 16.9 (*)    Neutro Abs 24.9 (*)    Lymphs Abs 0.5 (*)    Monocytes Absolute 1.1 (*)    Abs Immature Granulocytes 0.23 (*)    All other components within normal limits  MAGNESIUM - Abnormal; Notable for the following components:   Magnesium 2.5 (*)    All other components within normal limits  POTASSIUM - Abnormal; Notable for the following components:   Potassium 5.9 (*)    All other components within normal limits  LACTIC ACID, PLASMA - Abnormal; Notable for the following components:   Lactic Acid, Venous 2.5 (*)    All other components within normal limits  URINALYSIS, ROUTINE W REFLEX MICROSCOPIC - Abnormal; Notable for the following components:   Color, Urine AMBER (*)    APPearance TURBID (*)    Hgb urine dipstick SMALL (*)    Protein, ur 100 (*)    Leukocytes,Ua LARGE (*)    WBC, UA >50 (*)    Bacteria, UA MANY (*)    Non Squamous Epithelial 0-5 (*)    All other components within normal limits  CBG MONITORING, ED - Abnormal; Notable for the following components:   Glucose-Capillary 172 (*)    All other components within normal limits  I-STAT VENOUS BLOOD GAS, ED - Abnormal; Notable for the following components:   pO2, Ven 46 (*)    Potassium 6.2 (*)    Calcium, Ion 1.10 (*)    HCT 38.0 (*)    Hemoglobin 12.9 (*)    All other components within normal limits  CULTURE, BLOOD (ROUTINE X 2)  CULTURE, BLOOD (ROUTINE X 2)  URINE CULTURE  LACTIC ACID, PLASMA    EKG EKG Interpretation  Date/Time:  Tuesday June 25 2021 05:57:56 EDT Ventricular Rate:  64 PR Interval:  174 QRS Duration: 99 QT Interval:  424 QTC Calculation: 438 R Axis:   88 Text Interpretation: Sinus rhythm Borderline right axis deviation Confirmed by Quintella Reichert [phone]) on 06/25/2021 6:44:35 AM  Radiology DG Chest [facility] [address]  Result Date: 06/25/2021 CLINICAL DATA:  Seizures. EXAM: PORTABLE CHEST 1 VIEW COMPARISON:  06/20/2021. FINDINGS: The heart size and  mediastinal contours are within normal limits. Lung volumes are low. Mild apical pleural thickening is noted bilaterally. No consolidation, effusion, or pneumothorax. Evaluation of the right lung apex is limited due to overlapping structures. A tracheostomy tube terminates 7.2 cm above the carina. Sternotomy wires are noted over the midline. No acute osseous abnormality. IMPRESSION: Lung volumes with no acute process. Electronically Signed   By: Brett Fairy M.D.   On: 06/25/2021 04:35    Procedures Procedures   CRITICAL CARE Performed by: Quintella Reichert   Total critical care time: 65 minutes  Critical care time was exclusive of separately billable procedures and treating other patients.  Critical care was necessary to treat or prevent imminent or life-threatening deterioration.  Critical care was time spent personally by me on the following activities: development of treatment plan with patient and/or surrogate as well as nursing, discussions with consultants, evaluation of patient's response to treatment, examination of patient, obtaining history from patient or surrogate, ordering and performing treatments and interventions, ordering and review of laboratory studies, ordering and review of radiographic studies, pulse oximetry and re-evaluation of patient's condition.  Medications Ordered in ED Medications  levETIRAcetam (KEPPRA) IVPB 1000 mg/100 mL premix (has no administration in time range)  meropenem (MERREM) 1 g in sodium chloride 0.9 % 100 mL IVPB (1 g Intravenous New Bag/Given 06/25/21 0751)  propranolol (INDERAL) tablet 10 mg (has no administration in time range)  midodrine (PROAMATINE) tablet 10 mg (has no administration in time range)  ALPRAZolam (XANAX) tablet 0.5 mg (has no administration in time range)  escitalopram (LEXAPRO) tablet 10 mg (has no administration in time range)  hydrOXYzine (ATARAX) tablet 25 mg (has no administration in time range)  levothyroxine (SYNTHROID)  tablet 25 mcg (has no administration in time range)  esomeprazole (NEXIUM) suspension 40 mg (has no administration in time range)  lactulose (CHRONULAC) 10 GM/15ML solution 20 g (has no administration in time range)  ondansetron (ZOFRAN-ODT) disintegrating tablet 4 mg (has no administration in time range)  Culturelle Probiotics CHEW (has no administration in time range)  tamsulosin (FLOMAX) capsule 0.4 mg (has no administration in time range)  sodium chloride (OCEAN) 0.65 % nasal spray 1 spray (has no administration in time range)  guaiFENesin (ROBITUSSIN) 100 MG/5ML liquid 400 mg (has no administration in time range)  fluticasone (FLONASE) 50 MCG/ACT nasal spray 2 spray (has no administration in time range)  loratadine (CLARITIN) tablet 10 mg (has no administration in time range)  levalbuterol (XOPENEX) nebulizer solution 1.25 mg (has no administration in time range)  feeding supplement (JEVITY 1.5 CAL/FIBER) liquid (has no administration in time range)  ascorbic acid (VITAMIN C) tablet 500 mg (has no administration in time range)  multivitamin with minerals tablet 1 tablet (has no administration in time range)  folic acid (FOLVITE) tablet 1 mg (has no administration in time range)  polyethylene glycol powder (GLYCOLAX/MIRALAX) container 17 g (has no administration in time range)  acetaminophen (TYLENOL) CR tablet 650 mg (has no administration in time range)  enoxaparin (LOVENOX) injection 40 mg (has no administration in time range)  sodium chloride flush (NS) 0.9 % injection 3 mL (has no administration in time range)  0.9 %  sodium chloride infusion (has no administration in time range)  LORazepam (ATIVAN) injection 1 mg (1 mg Intravenous Given 06/25/21 0405)  LORazepam (ATIVAN) injection 1 mg (1 mg Intravenous Given 06/25/21  3435)  levETIRAcetam (KEPPRA) IVPB 1000 mg/100 mL premix (0 mg Intravenous Stopped 06/25/21 0509)  sodium chloride 0.9 % bolus 500 mL (0 mLs Intravenous Stopped 06/25/21 0740)   sodium chloride 0.9 % bolus 500 mL (500 mLs Intravenous New Bag/Given 06/25/21 0741)  LORazepam (ATIVAN) injection 2 mg (2 mg Intravenous Given 06/25/21 0703)    ED Course/ Medical Decision Making/ A&P                           Medical Decision Making Amount and/or Complexity of Data Reviewed Labs: ordered. Radiology: ordered.  Risk Prescription drug management. Decision regarding hospitalization.   Patient with history of seizure disorder, COPD with ventilator dependence here for evaluation of seizures.  On ED arrival patient with seizure activity.  He was treated with 1 mg of Ativan x2 as well as 1 g Keppra load.  Difficult to ascertain if he had ongoing seizure activity after these medications and neurology was consulted.  He was evaluated by the neurologist in the emergency department with recommendation to admit for continuous EEG monitoring.  Patient hypothermic with leukocytosis during his ED stay.  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  Per staff at facility patient with no clear infectious symptoms recently but he has experienced significant increase in his seizure activity.  Unclear if these lab abnormalities and hypothermia are secondary to prolonged seizure activity versus underlying infection.  We will treat with antibiotics.  During patient's ED stay he did have an additional event where he had gaze of his eyes to the right and twitching of his mouth.  Also during this event he did have hypoxia did not appear to be moving air despite being connected to the ventilator.  He was removed from the ventilator and bagged as well as treated with 2 mg of Ativan.  Unclear if this was a mucous plugging event that resulted in hypoxia with seizure activity versus a recurrent seizure.  Discussed with intensivist-critical care will consult on the patient regarding vent management.  Medicine consulted for admission for ongoing work-up and treatment for his  seizures.        Final Clinical Impression(s) / ED Diagnoses Final diagnoses:  Seizure ([place])  Leukocytosis, unspecified type  Hyperkalemia    Rx / DC Orders ED Discharge Orders     None         Quintella Reichert, MD 06/25/21 [phone]

## 2021-06-25 NOTE — ED Triage Notes (Signed)
Patient arrived with [facility] from [facility] staff reported 2 episodes of seizures this evening , tracheostomy/ventilator dependent .

## 2021-06-25 NOTE — Consult Note (Signed)
NEUROLOGY CONSULTATION NOTE   Date of service: June 25, 2021 Patient Name: Austin Davis MRN:  [id number] DOB:  [birth year] Reason for consult: "Seizures x 2 and concern for persistent seizures" Requesting Provider: Quintella Reichert, MD _ _ _   _ __   _ __ _ _  __ __   _ __   __ _  History of Present Illness  Austin Davis is a 81 y.o. male with PMH significant for chronic vent dependence, tremor, paroxysmal atrial fibrillation not on anticoagulation, presumed seizures on Keppra 1000mg  BID who presents from his LTAC with 2 episodes of seizures with R gaze deviation.  The first episode happened around 2100 and lasted about 20 mins and resolved and he was back to his baseline. He had recurrence at 0200 and was noted to be starring off with R gaZe deviation and R head turn. He did not get an rescue medicine. He was sent to the ED where he got ativan x 2 and loaded with Keppra 1gram IV once. He is currently post ictal and poorly responsive and unable to provide any meaningful history. I was unable to get in touch with staff at [facility] for further information.  He has been evaluated by our team multiple times in the last couple of months for seizure like episodes. Workup so far with rEEG with no significant abnormalities but none of his seizures have been captured on the cEEG. He does have baseline tremor involving all extremities and his jaw.   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  Past History   Past Medical History:  Diagnosis Date   CHF (congestive heart failure) ([place])    Coronary artery disease    Essential tremor    GI bleed 2019   hospitalized at [facility] for one week   Headache    since childhood   Low blood sugar    since childhood, controlled by diet   Mitral regurgitation    Mitral valve prolapse    Osteopenia 2021   Paroxysmal atrial fibrillation ([place])    Past Surgical History:  Procedure Laterality Date    COLONOSCOPY WITH PROPOFOL N/A 02/19/2017   Procedure: COLONOSCOPY WITH PROPOFOL;  Surgeon: Wilford Corner, MD;  Location: [facility];  Service: Endoscopy;  Laterality: N/A;   IR GASTROSTOMY TUBE MOD SED  10/18/2020   IR IVC FILTER PLMT / S&I /IMG GUID/MOD SED  11/01/2020   laser eye surgery for retina detachment     MITRAL VALVE REPAIR  01/2003   monitor  02/05/2006   polyp removal     TONSILLECTOMY     tooth removal     as a teenager   TRACHEOSTOMY TUBE PLACEMENT N/A 10/26/2020   Procedure: TRACHEOSTOMY;  Surgeon: Rozetta Nunnery, MD;  Location: [facility];  Service: ENT;  Laterality: N/A;   Family History  Problem Relation Age of Onset   Heart disease Mother    CAD Mother    Migraines Mother    Heart failure Father    Skin cancer Father    Valvular heart disease Father        prolapsed valve   Tremor Father        "probably had a bit of tremor in his old age"   Heart failure Maternal Grandmother    Pneumonia Maternal Grandfather    Heart failure Paternal Grandmother    Stroke Paternal Grandfather    Asthma Daughter  Epilepsy Son    Parkinson's disease Neg Hx    Social History   Socioeconomic History   Marital status: Single    Spouse name: Not on file   Number of children: 2   Years of education: 16   Highest education level: Not on file  Occupational History   Occupation: [employer]  Tobacco Use   Smoking status: Never   Smokeless tobacco: Never   Tobacco comments:    only tried a few cigarettes when he was younger  Substance and Sexual Activity   Alcohol use: Yes    Comment: "probably a 6 pack of beer a year"   Drug use: No   Sexual activity: Not on file  Other Topics Concern   Not on file  Social History Narrative   Lives with girlfriend   Caffeine use: rarely    Right handed   Social Determinants of Health   Financial Resource Strain: Not on file  Food Insecurity: Not on file  Transportation Needs: Not on file  Physical  Activity: Not on file  Stress: Not on file  Social Connections: Not on file   Allergies  Allergen Reactions   Prednisone Other (See Comments)    Makes "skin crawl," rapid HR- "Allergic," per [facility]   Cortisone Palpitations and Other (See Comments)    "Allergic," per [facility]    Medications  (Not in a hospital admission)    Vitals   Vitals:   06/25/21 0345 06/25/21 0400 06/25/21 0415 06/25/21 0430  BP: 119/87 (!) 144/127 122/87 117/71  Pulse: 80 87 81 81  Resp: 15 16 19 19   SpO2: 97% 100% 100% 96%     There is no height or weight on file to calculate BMI.  Physical Exam   General: Laying comfortably in bed; in no acute distress. HENT: Normal oropharynx and mucosa. Normal external appearance of ears and nose. Neck: Supple, no pain or tenderness. Trach in place. CV: No JVD. No peripheral edema.  Pulmonary: Symmetric Chest rise. Normal respiratory effort.  Abdomen: Soft to touch, non-tender Ext: No cyanosis, edema, or deformity  Skin: No rash. Normal palpation of skin.   Musculoskeletal: Normal digits and nails by inspection. No clubbing.  Neurologic Examination  Mental status/Cognition: grimaces to tactile stimulit and nare stimulation with tremors in all extremities and jaw. Speech/language: none during this encounter. Does not follow commands. Somnolence and sedation precludes detailed evaluation. Cranial nerves:   CN II Pupils equal and reactive to light, unable to assess for VF deficits.   CN III,IV,VI EOM intact to dolls eyes, no gaze preference or deviation.   CN V Corneals intact BL   CN VII no asymmetry, symmetric facial grimace.   CN VIII Does not turn head towards speech   CN IX & X Mouth open, unable to assess. Uvula is midline.   CN XI Midline head.   CN XII midline tongue but does not protrude on command.   Motor/sensory:  Muscle bulk: poor, tone paratonia. Tremors noted with movements in BL upper extremities. Unable to do detailed strength testing secondary  to sedation. He withdraws in all extremities.  Coordination/Complex Motor:  - unable to assess.  Labs   CBC:  Recent Labs  Lab 06/20/21 2245 06/25/21 0435  WBC 16.6* 26.7*  NEUTROABS 15.1* PENDING  HGB 11.4* 11.5*  HCT 36.3* 38.6*  MCV 92.6 96.3  PLT 359 440    Basic Metabolic Panel:  Lab Results  Component Value Date   NA 141 06/20/2021  K 4.7 06/20/2021   CO2 31 06/20/2021   GLUCOSE 162 (H) 06/20/2021   BUN 20 06/20/2021   CREATININE 0.56 (L) 06/20/2021   CALCIUM 9.8 06/20/2021   GFRNONAA >60 06/20/2021   GFRAA >60 02/17/2017   Lipid Panel:  Lab Results  Component Value Date   LDLCALC 110 01/30/2015   HgbA1c:  Lab Results  Component Value Date   HGBA1C 5.5 04/11/2021   Urine Drug Screen: No results found for: LABOPIA, COCAINSCRNUR, LABBENZ, AMPHETMU, THCU, LABBARB  Alcohol Level No results found for: ETH  cEEG:  pending  Impression   Austin Davis is a 81 y.o. male with PMH significant for tremor, paroxysmal atrial fibrillation not on anticoagulation, presumed seizures on Keppra 1000mg  BID who presents from his LTAC with 2 episodes of seizures with R gaze deviation. His neurologic examination is non focal and most consistent with post ictal somnolence.  He has been to the ED several times in the last couple months with recurrent episodes concerning for seizures. He continues to have these despite uptitrating his Keppra. However, none of his episodes have been captured on cEEG so far and interictal EEG is nonrevealing.  Recommendations  - cEEG for spell capture. - continue Keppra 1000mg  BID for now. ______________________________________________________________________  This patient is critically ill and at significant risk of neurological worsening, death and care requires constant monitoring of vital signs, hemodynamics,respiratory and cardiac monitoring, neurological assessment, discussion with family, other specialists and medical decision making  of high complexity. I spent 40 minutes of neurocritical care time  in the care of  this patient. This was time spent independent of any time provided by nurse practitioner or PA.  Donnetta Simpers [facility] Pager Number [phone] 06/25/2021  6:51 AM  Thank you for the opportunity to take part in the care of this patient. If you have any further questions, please contact the neurology consultation attending.  Signed,  [place] Pager Number [phone] _ _ _   _ __   _ __ _ _  __ __   _ __   __ _

## 2021-06-25 NOTE — Consult Note (Addendum)
NAME:  Austin Davis, MRN:  [id number], DOB:  [birth year], LOS: 0 ADMISSION DATE:  06/25/2021, CONSULTATION DATE: 6/6 REFERRING MD:  Dr. Baldo Ash, CHIEF COMPLAINT: Reported seizures  History of Present Illness:  81 y/o M who presented to [facility] ER on 6/6 with concern for seizures.   The patient is a [facility] resident rehab side.  At baseline, he has tremor of his jaw and all extremities.   At baseline, the patient can interact by mouthing words and is typically oriented x3.  Per [facility] staff, the patient reportedly had seizure activity for approximately 20 minutes around 9pm on 6/5 then returned to his baseline.  They canceled the EMS call.  He subsequently had additional seizure activity around 2am that did not stop and EMS was activated.  He reportedly was staring into space with right gaze deviation and had a mild tremble.  Staff reported no recent illness, fevers or complaints.  They have reportedly noted increased seizure activity. He was seen by Neurology on admit.  He is well known to their service - prior EEG with no significant abnormalities, but none of his seizures have been captured on continuous EEG.  The patient was given ativan for seizure in the ER.  He was admitted per [facility]. The patient was pan cultured.  Initial labs Na 138, K 6.4, Cl 107, CO2 24, glucose 195, BUN 22, Sr Cr 0.66, Mg 2.5, alk phos 143, WBC 26.7, Hgb 11.5, platelets 340. CXR showed trach in position but slightly high with hyperinflation of cuff, mild basilar atelectasis.   PCCM consulted for chronic vent management.   Pertinent  Medical History  CAD  CHF  MVP / MVR - s/p repair  PAF - not on anticoagulation  GIB - 2019 IVC Filter - 2022 Chronic Respiratory Failure requiring Tracheostomy  PEG Status Tremor   Significant Hospital Events: Including procedures, antibiotic start and stop dates in addition to other pertinent events   6/6 Admit with concern for seizure   Interim History / Subjective:   Afebrile, tmax 97.1 / WBC 26.7  Cultures pending  PCV - PEEP 5, 40% fiO2  Glucose trend 172-195   Objective   Blood pressure (!) 97/59, pulse (!) 57, temperature (!) 97.1 F (36.2 C), temperature source Temporal, resp. rate 16, SpO2 100 %.    Vent Mode: PCV FiO2 (%):  [40 %-60 %] 60 % Set Rate:  [18 bmp] 18 bmp PEEP:  [5 cmH20-8 cmH20] 8 cmH20 Plateau Pressure:  [19 cmH20-22 cmH20] 19 cmH20   Intake/Output Summary (Last 24 hours) at 06/25/2021 0859 Last data filed at 06/25/2021 0740 Gross per 24 hour  Intake 500 ml  Output --  Net 500 ml   There were no vitals filed for this visit.  Examination: General: elderly adult male lying in bed in NAD HENT: MM pink/dry, anicteric, pupils 70m reactive Lungs: trach midline clean/dry, non-labored on vent, lungs bilaterally with rhonchi that clear with suctioning  Cardiovascular: S1S2 RRR, no m/r/g, SR 70's on monitor  Abdomen: soft/non-tender, bsx4 active, PEG c/d/i Extremities: warm/dry Neuro: eyes closed, grimaces when name called, does not follow commands, minimal spontaneous movement noted  Resolved Hospital Problem list      Assessment & Plan:   Chronic Hypoxic Respiratory Failure with Tracheostomy Status Multiple prior attempts during previous admissions at finding an improved fit for trach including #6 [place], Bivona trach were not successful.  Needs #6 XLT Distal.  -PCV for chronic vent support  -wean PEEP / fiO2 for  sats >90% -not a candidate for weaning  -trach balloon noted to be overdistended on CXR, with RT, balloon deflated and cuff pressure checked.  Trach ties repositioned -repeat CXR post positioning  -trach care per protocol  -avoid over distention of balloon -keep trach ties securely fixed, no more than two finger width spacing  -guaifenesin per tube  -xopenex Q6 PRN   Seizures  -per Neuro / [facility]  -EEG pending   Chronic Hypotension  -midodrine   Known Hx of Spiculated Lung Nodule -not a candidate for  further work up based on prior evaluation   Best Practice (right click and "Reselect all SmartList Selections" daily)  Per Primary / [facility]   Labs   CBC: Recent Labs  Lab 06/20/21 2245 06/25/21 0435 06/25/21 0441  WBC 16.6* 26.7*  --   NEUTROABS 15.1* 24.9*  --   HGB 11.4* 11.5* 12.9*  HCT 36.3* 38.6* 38.0*  MCV 92.6 96.3  --   PLT 359 340  --     Basic Metabolic Panel: Recent Labs  Lab 06/20/21 2304 06/25/21 0435 06/25/21 0441 06/25/21 0605  NA 141 138 137  --   K 4.7 6.4* 6.2* 5.9*  CL 102 107  --   --   CO2 31 24  --   --   GLUCOSE 162* 195*  --   --   BUN 20 22  --   --   CREATININE 0.56* 0.66  --   --   CALCIUM 9.8 9.7  --   --   MG 2.3 2.5*  --   --    GFR: Estimated Creatinine Clearance: 57.3 mL/min (by C-G formula based on SCr of 0.66 mg/dL). Recent Labs  Lab 06/20/21 2245 06/25/21 0435 06/25/21 0605 06/25/21 0730  WBC 16.6* 26.7*  --   --   LATICACIDVEN  --   --  2.5* 1.9    Liver Function Tests: Recent Labs  Lab 06/20/21 2304 06/25/21 0435  AST 25 31  ALT 33 42  ALKPHOS 118 143*  BILITOT 0.3 0.4  PROT 7.4 7.9  ALBUMIN 3.4* 3.7   No results for input(s): LIPASE, AMYLASE in the last 168 hours. No results for input(s): AMMONIA in the last 168 hours.  ABG    Component Value Date/Time   PHART 7.424 06/04/2021 2002   PCO2ART 40.1 06/04/2021 2002   PO2ART 382 (H) 06/04/2021 2002   HCO3 26.2 06/25/2021 0441   TCO2 28 06/25/2021 0441   ACIDBASEDEF 2.0 05/30/2021 0052   O2SAT 78 06/25/2021 0441     Coagulation Profile: No results for input(s): INR, PROTIME in the last 168 hours.  Cardiac Enzymes: No results for input(s): CKTOTAL, CKMB, CKMBINDEX, TROPONINI in the last 168 hours.  HbA1C: Hgb A1c MFr Bld  Date/Time Value Ref Range Status  04/11/2021 09:12 PM 5.5 4.8 - 5.6 % Final    Comment:    (NOTE) Pre diabetes:          5.7%-6.4%  Diabetes:              >6.4%  Glycemic control for   <7.0% adults with diabetes   10/02/2020  04:46 AM 6.1 (H) 4.8 - 5.6 % Final    Comment:    (NOTE) Pre diabetes:          5.7%-6.4%  Diabetes:              >6.4%  Glycemic control for   <7.0% adults with diabetes     CBG: Recent  Labs  Lab 06/25/21 0502  GLUCAP 172*    Review of Systems:   Unable to complete as patient is altered on mechanical ventilation.   Past Medical History:  He,  has a past medical history of CHF (congestive heart failure) ([place]), Coronary artery disease, Essential tremor, GI bleed (2019), Headache, Low blood sugar, Mitral regurgitation, Mitral valve prolapse, Osteopenia (2021), and Paroxysmal atrial fibrillation ([place]).   Surgical History:   Past Surgical History:  Procedure Laterality Date   COLONOSCOPY WITH PROPOFOL N/A 02/19/2017   Procedure: COLONOSCOPY WITH PROPOFOL;  Surgeon: Wilford Corner, MD;  Location: [facility];  Service: Endoscopy;  Laterality: N/A;   IR GASTROSTOMY TUBE MOD SED  10/18/2020   IR IVC FILTER PLMT / S&I /IMG GUID/MOD SED  11/01/2020   laser eye surgery for retina detachment     MITRAL VALVE REPAIR  01/2003   monitor  02/05/2006   polyp removal     TONSILLECTOMY     tooth removal     as a teenager   TRACHEOSTOMY TUBE PLACEMENT N/A 10/26/2020   Procedure: TRACHEOSTOMY;  Surgeon: Rozetta Nunnery, MD;  Location: [facility];  Service: ENT;  Laterality: N/A;     Social History:   reports that he has never smoked. He has never used smokeless tobacco. He reports current alcohol use. He reports that he does not use drugs.   Family History:  His family history includes Asthma in his daughter; CAD in his mother; Epilepsy in his son; Heart disease in his mother; Heart failure in his father, maternal grandmother, and paternal grandmother; Migraines in his mother; Pneumonia in his maternal grandfather; Skin cancer in his father; Stroke in his paternal grandfather; Tremor in his father; Valvular heart disease in his father. There is no history of Parkinson's disease.    Allergies Allergies  Allergen Reactions   Prednisone Other (See Comments)    Makes "skin crawl," rapid HR- "Allergic," per MAR   Cortisone Palpitations and Other (See Comments)    "Allergic," per [facility]     Home Medications  Prior to Admission medications   Medication Sig Start Date End Date Taking? Authorizing Provider  acetaminophen (TYLENOL) 650 MG CR tablet 650 mg See admin instructions. 650 mg, per tube, every 8 hours as needed for moderate pain   Yes [provider]  ALPRAZolam (XANAX) 0.5 MG tablet Place 1 tablet (0.5 mg total) into feeding tube every 8 (eight) hours as needed for anxiety. 05/31/21  Yes Pahwani, Einar Grad, MD  escitalopram (LEXAPRO) 10 MG tablet Place 10 mg into feeding tube daily.   Yes [provider]  esomeprazole (NEXIUM) 40 MG packet 40 mg See admin instructions. 40 mg, per tube, every morning   Yes [provider]  fexofenadine (ALLEGRA) 180 MG tablet Place 180 mg into feeding tube daily.   Yes [provider]  fluticasone (FLONASE) 50 MCG/ACT nasal spray Place 2 sprays into both nostrils in the morning and at bedtime.   Yes [provider]  folic acid (FOLVITE) 1 MG tablet Take 1 tablet (1 mg total) by mouth daily. Patient taking differently: Place 1 mg into feeding tube daily. 09/29/20  Yes [name], MD  furosemide (LASIX) 20 MG tablet Place 1 tablet (20 mg total) into feeding tube daily as needed for fluid or edema. 04/16/21  Yes Vann, Jessica U, DO  GERI-TUSSIN 100 MG/5ML liquid Place 400 mg into feeding tube 3 (three) times daily.   Yes [provider]  hydrOXYzine (ATARAX)  25 MG tablet Place 25 mg into feeding tube every 6 (six) hours as needed for anxiety.   Yes [provider]  lactulose (CHRONULAC) 10 GM/15ML solution Place 20 g into feeding tube 2 (two) times daily as needed for mild constipation.   Yes [provider]  levalbuterol (XOPENEX) 1.25 MG/3ML nebulizer solution Take  1.25 mg by nebulization every 6 (six) hours.   Yes [provider]  levETIRAcetam (KEPPRA) 100 MG/ML solution Place 1,000 mg into feeding tube in the morning and at bedtime.   Yes [provider]  levothyroxine (SYNTHROID) 25 MCG tablet Place 25 mcg into feeding tube daily before breakfast.   Yes [provider]  midodrine (PROAMATINE) 10 MG tablet Place 1 tablet (10 mg total) into feeding tube 3 (three) times daily with meals. 05/01/21  Yes Thurnell Lose, MD  Multiple Vitamin (MULTIVITAMIN WITH MINERALS) TABS tablet Place 1 tablet into feeding tube daily. 05/11/21  Yes Sheikh, Omair Latif, DO  Nutritional Supplements (FEEDING SUPPLEMENT, JEVITY 1.5 CAL/FIBER,) LIQD Place 1,000 mLs into feeding tube continuous. Patient taking differently: Place 55 mL/hr into feeding tube continuous. 05/10/21  Yes Sheikh, Omair Latif, DO  Nutritional Supplements (FEEDING SUPPLEMENT, JEVITY 1.5 CAL/FIBER,) LIQD Place 55 mL/hr into feeding tube continuous.   Yes [provider]  Omega-3 Fatty Acids (SUPER OMEGA 3 EPA/DHA) 1000 MG CAPS Give 1,000 mg by tube daily.   Yes [provider]  ondansetron (ZOFRAN-ODT) 4 MG disintegrating tablet 4 mg See admin instructions. 4 mg, per tube, every six hours as needed for nausea with vomiting   Yes [provider]  polyethylene glycol powder (GLYCOLAX/MIRALAX) 17 GM/SCOOP powder Place 17 g into feeding tube See admin instructions. 17 grams, per tube, every 2 days   Yes [provider]  Probiotic Product (CULTURELLE PROBIOTICS PO) Give 1 capsule by tube daily. 10 billion cell   Yes [provider]  propranolol (INDERAL) 10 MG tablet Place 1 tablet (10 mg total) into feeding tube daily as needed (for tremors in AM. do not administer if heart rate is below 60 or systolic blood pressure is below 90.). Patient taking differently: Place 10 mg into feeding tube See admin instructions. 10 mg, per tube, in the morning as  needed for tremors and do not administer if heart rate is less than 60 or Systolic B/P is less than 90 05/01/21  Yes Thurnell Lose, MD  sodium chloride (OCEAN) 0.65 % SOLN nasal spray Place 1 spray into both nostrils every 4 (four) hours as needed (for allergic rhinitis).   Yes [provider]  tamsulosin (FLOMAX) 0.4 MG CAPS capsule 0.4 mg See admin instructions. 0.4 mg per tube at bedtime   Yes [provider]  vitamin C (ASCORBIC ACID) 500 MG tablet Place 500 mg into feeding tube daily.   Yes [provider]  zinc gluconate 50 MG tablet Place 50 mg into feeding tube 2 (two) times daily.   Yes [provider]  ondansetron (ZOFRAN) 4 MG tablet Take 1 tablet (4 mg total) by mouth every 6 (six) hours as needed for nausea. Patient not taking: Reported on 06/04/2021 05/10/21   Kerney Elbe, DO     Critical care time: 30 minutes    Noe Gens, MSN, APRN, NP-C, AGACNP-BC [facility] 06/25/2021, 8:59 AM   Please see [email or web address] for pager details.   From 7A-7P if no response, please call [phone] After hours, please call ELink [phone]

## 2021-06-25 NOTE — Progress Notes (Signed)
LTM EEG hooked up and running - no initial skin breakdown - push button tested - neuro notified. [facility] monitoring.  

## 2021-06-25 NOTE — Progress Notes (Signed)
Ventilator patient transported from ED to 4N30 without any complications.

## 2021-06-25 NOTE — Progress Notes (Signed)
Initial Nutrition Assessment  DOCUMENTATION CODES:   Underweight, Severe malnutrition in context of chronic illness  INTERVENTION:   Tube feeding via PEG: Jevity 1.5 at 55 ml/h (1320 ml per day) Prosource TF 45 ml BID   Provides 2060 kcal, 106 gm protein, 1003 ml free water daily.   MVI with minerals daily via tube   NUTRITION DIAGNOSIS:   Severe Malnutrition related to chronic illness as evidenced by severe muscle depletion, severe fat depletion.  GOAL:   Patient will meet greater than or equal to 90% of their needs  MONITOR:   TF tolerance, Weight trends  REASON FOR ASSESSMENT:   Ventilator, Consult Enteral/tube feeding initiation and management  ASSESSMENT:   Pt with PMH of CAD, CHF, MVR s/p repair, PAF, GIB, IVC filter, baseline tremor of jaw and all extremities, chronic respiratory failure s/p trach and PEG now admitted from [facility] (rehab) for seizures.   Pt discussed during ICU rounds and with RN and MD. Unable to weigh pt on current bed. No family at bedside. Pt on cEEG. Noted constant tremor of his jaw.   Medications reviewed and include: 500 mg vitamin C daily, folic acid, synthroid, MVI with minerals, protonix, miralax, florastor  Labs reviewed:  CBG: 102-144   65F PEG  NUTRITION - FOCUSED PHYSICAL EXAM:  Flowsheet Row Most Recent Value  Orbital Region Severe depletion  Upper Arm Region Severe depletion  Thoracic and Lumbar Region Severe depletion  Buccal Region Severe depletion  Temple Region Severe depletion  Clavicle Bone Region Severe depletion  Clavicle and Acromion Bone Region Severe depletion  Scapular Bone Region Severe depletion  Dorsal Hand Severe depletion  Patellar Region Severe depletion  Anterior Thigh Region Severe depletion  Posterior Calf Region Severe depletion  Edema (RD Assessment) None  Hair Reviewed  Eyes Unable to assess  Mouth Unable to assess  Skin Reviewed  Nails Reviewed       Diet Order:   Diet Order              Diet NPO time specified  Diet effective now                   EDUCATION NEEDS:   Not appropriate for education at this time  Skin:  Skin Assessment: Skin Integrity Issues: Skin Integrity Issues:: Stage I Stage I: sacrum  Last BM:  unknown  Height:   Ht Readings from Last 1 Encounters:  06/20/21 5\' 10"  (1.778 m)    Weight:   Wt Readings from Last 1 Encounters:  06/20/21 55 kg    BMI:  There is no height or weight on file to calculate BMI.  Estimated Nutritional Needs:   Kcal:  1900-2100  Protein:  95-110  Fluid:  >1.9 L/day  Lockie Pares., RD, LDN, CNSC See AMiON for contact information

## 2021-06-26 DIAGNOSIS — J449 Chronic obstructive pulmonary disease, unspecified: Secondary | ICD-10-CM | POA: Diagnosis not present

## 2021-06-26 DIAGNOSIS — I5032 Chronic diastolic (congestive) heart failure: Secondary | ICD-10-CM | POA: Diagnosis not present

## 2021-06-26 DIAGNOSIS — D72829 Elevated white blood cell count, unspecified: Secondary | ICD-10-CM | POA: Diagnosis not present

## 2021-06-26 DIAGNOSIS — R1310 Dysphagia, unspecified: Secondary | ICD-10-CM | POA: Diagnosis not present

## 2021-06-26 DIAGNOSIS — Z431 Encounter for attention to gastrostomy: Secondary | ICD-10-CM | POA: Diagnosis not present

## 2021-06-26 DIAGNOSIS — A419 Sepsis, unspecified organism: Secondary | ICD-10-CM | POA: Diagnosis not present

## 2021-06-26 DIAGNOSIS — I82402 Acute embolism and thrombosis of unspecified deep veins of left lower extremity: Secondary | ICD-10-CM | POA: Diagnosis not present

## 2021-06-26 DIAGNOSIS — G319 Degenerative disease of nervous system, unspecified: Secondary | ICD-10-CM | POA: Diagnosis not present

## 2021-06-26 DIAGNOSIS — N400 Enlarged prostate without lower urinary tract symptoms: Secondary | ICD-10-CM | POA: Diagnosis not present

## 2021-06-26 DIAGNOSIS — R569 Unspecified convulsions: Secondary | ICD-10-CM | POA: Diagnosis not present

## 2021-06-26 DIAGNOSIS — Z430 Encounter for attention to tracheostomy: Secondary | ICD-10-CM | POA: Diagnosis not present

## 2021-06-26 DIAGNOSIS — G250 Essential tremor: Secondary | ICD-10-CM | POA: Diagnosis not present

## 2021-06-26 DIAGNOSIS — I739 Peripheral vascular disease, unspecified: Secondary | ICD-10-CM | POA: Diagnosis not present

## 2021-06-26 DIAGNOSIS — E039 Hypothyroidism, unspecified: Secondary | ICD-10-CM | POA: Diagnosis not present

## 2021-06-26 DIAGNOSIS — J9620 Acute and chronic respiratory failure, unspecified whether with hypoxia or hypercapnia: Secondary | ICD-10-CM | POA: Diagnosis not present

## 2021-06-26 DIAGNOSIS — J9611 Chronic respiratory failure with hypoxia: Secondary | ICD-10-CM | POA: Diagnosis not present

## 2021-06-26 DIAGNOSIS — I5023 Acute on chronic systolic (congestive) heart failure: Secondary | ICD-10-CM | POA: Diagnosis not present

## 2021-06-26 DIAGNOSIS — Z79899 Other long term (current) drug therapy: Secondary | ICD-10-CM | POA: Diagnosis not present

## 2021-06-26 DIAGNOSIS — N390 Urinary tract infection, site not specified: Secondary | ICD-10-CM | POA: Diagnosis not present

## 2021-06-26 DIAGNOSIS — E875 Hyperkalemia: Secondary | ICD-10-CM | POA: Diagnosis not present

## 2021-06-26 DIAGNOSIS — Z931 Gastrostomy status: Secondary | ICD-10-CM | POA: Diagnosis not present

## 2021-06-26 DIAGNOSIS — M79675 Pain in left toe(s): Secondary | ICD-10-CM | POA: Diagnosis not present

## 2021-06-26 DIAGNOSIS — G9341 Metabolic encephalopathy: Secondary | ICD-10-CM | POA: Diagnosis not present

## 2021-06-26 DIAGNOSIS — L84 Corns and callosities: Secondary | ICD-10-CM | POA: Diagnosis not present

## 2021-06-26 DIAGNOSIS — Z9911 Dependence on respirator [ventilator] status: Secondary | ICD-10-CM | POA: Diagnosis not present

## 2021-06-26 DIAGNOSIS — G4733 Obstructive sleep apnea (adult) (pediatric): Secondary | ICD-10-CM | POA: Diagnosis not present

## 2021-06-26 DIAGNOSIS — R251 Tremor, unspecified: Secondary | ICD-10-CM | POA: Diagnosis not present

## 2021-06-26 DIAGNOSIS — A4189 Other specified sepsis: Secondary | ICD-10-CM | POA: Diagnosis not present

## 2021-06-26 DIAGNOSIS — I9589 Other hypotension: Secondary | ICD-10-CM | POA: Diagnosis not present

## 2021-06-26 DIAGNOSIS — B351 Tinea unguium: Secondary | ICD-10-CM | POA: Diagnosis not present

## 2021-06-26 DIAGNOSIS — I480 Paroxysmal atrial fibrillation: Secondary | ICD-10-CM | POA: Diagnosis not present

## 2021-06-26 DIAGNOSIS — F32A Depression, unspecified: Secondary | ICD-10-CM | POA: Diagnosis not present

## 2021-06-26 DIAGNOSIS — M2041 Other hammer toe(s) (acquired), right foot: Secondary | ICD-10-CM | POA: Diagnosis not present

## 2021-06-26 DIAGNOSIS — R6520 Severe sepsis without septic shock: Secondary | ICD-10-CM | POA: Diagnosis not present

## 2021-06-26 DIAGNOSIS — N310 Uninhibited neuropathic bladder, not elsewhere classified: Secondary | ICD-10-CM | POA: Diagnosis not present

## 2021-06-26 DIAGNOSIS — R790 Abnormal level of blood mineral: Secondary | ICD-10-CM | POA: Diagnosis not present

## 2021-06-26 DIAGNOSIS — R9431 Abnormal electrocardiogram [ECG] [EKG]: Secondary | ICD-10-CM | POA: Diagnosis not present

## 2021-06-26 DIAGNOSIS — I959 Hypotension, unspecified: Secondary | ICD-10-CM | POA: Diagnosis not present

## 2021-06-26 DIAGNOSIS — G4089 Other seizures: Secondary | ICD-10-CM | POA: Diagnosis not present

## 2021-06-26 DIAGNOSIS — T83511A Infection and inflammatory reaction due to indwelling urethral catheter, initial encounter: Secondary | ICD-10-CM | POA: Diagnosis not present

## 2021-06-26 LAB — CBC
HCT: 29.2 % — ABNORMAL LOW (ref 39.0–52.0)
Hemoglobin: 9.2 g/dL — ABNORMAL LOW (ref 13.0–17.0)
MCH: 28.9 pg (ref 26.0–34.0)
MCHC: 31.5 g/dL (ref 30.0–36.0)
MCV: 91.8 fL (ref 80.0–100.0)
Platelets: 237 10*3/uL (ref 150–400)
RBC: 3.18 MIL/uL — ABNORMAL LOW (ref 4.22–5.81)
RDW: 16.9 % — ABNORMAL HIGH (ref 11.5–15.5)
WBC: 10.9 10*3/uL — ABNORMAL HIGH (ref 4.0–10.5)
nRBC: 0 % (ref 0.0–0.2)

## 2021-06-26 LAB — BASIC METABOLIC PANEL
Anion gap: 5 (ref 5–15)
BUN: 26 mg/dL — ABNORMAL HIGH (ref 8–23)
CO2: 26 mmol/L (ref 22–32)
Calcium: 9 mg/dL (ref 8.9–10.3)
Chloride: 109 mmol/L (ref 98–111)
Creatinine, Ser: 0.53 mg/dL — ABNORMAL LOW (ref 0.61–1.24)
GFR, Estimated: >60 mL/min (ref >60)
Glucose, Bld: 105 mg/dL — ABNORMAL HIGH (ref 70–99)
Potassium: 4.1 mmol/L (ref 3.5–5.1)
Sodium: 140 mmol/L (ref 135–145)

## 2021-06-26 LAB — CULTURE, BLOOD (ROUTINE X 2)
Culture: NO GROWTH
Culture: NO GROWTH
Special Requests: ADEQUATE

## 2021-06-26 LAB — URINE CULTURE

## 2021-06-26 LAB — GLUCOSE, CAPILLARY
Glucose-Capillary: 102 mg/dL — ABNORMAL HIGH (ref 70–99)
Glucose-Capillary: 144 mg/dL — ABNORMAL HIGH (ref 70–99)
Glucose-Capillary: 96 mg/dL (ref 70–99)

## 2021-06-26 MED ORDER — SODIUM CHLORIDE 0.9 % IV SOLN: 1.0000 g | Freq: Three times a day (TID) | INTRAVENOUS | Status: AC

## 2021-06-26 MED ORDER — BETHANECHOL CHLORIDE 10 MG PO TABS: 10.0000 mg | ORAL_TABLET | Freq: Three times a day (TID) | ORAL | Status: DC

## 2021-06-26 MED ORDER — FUROSEMIDE 20 MG PO TABS: 20.0000 mg | ORAL_TABLET | Freq: Every day | ORAL | Status: DC | PRN

## 2021-06-26 MED ORDER — IPRATROPIUM-ALBUTEROL 0.5-2.5 (3) MG/3ML IN SOLN
3.0000 mL | Freq: Three times a day (TID) | RESPIRATORY_TRACT | Status: DC
  Administered 2021-06-26: 3 mL via RESPIRATORY_TRACT
  Filled 2021-06-26: qty 3

## 2021-06-26 MED ORDER — LEVETIRACETAM 500 MG PO TABS: 1000.0000 mg | ORAL_TABLET | Freq: Two times a day (BID) | ORAL | Status: DC

## 2021-06-26 MED ORDER — VALTOCO 5 MG DOSE 5 MG/0.1ML NA LIQD: NASAL | Status: DC

## 2021-06-26 NOTE — Progress Notes (Addendum)
Subjective: No further seizures overnight.  Awake, following commands this morning.  ROS: Unable to obtain due to tracheostomy  Examination  Vital signs in last 24 hours: Temp:  [99.1 F (37.3 C)-99.9 F (37.7 C)] 99.4 F (37.4 C) (06/07 0758) Pulse Rate:  [54-76] 54 (06/07 1125) Resp:  [12-21] 18 (06/07 1125) BP: (98-139)/(53-99) 127/70 (06/07 1100) SpO2:  [96 %-100 %] 99 % (06/07 1125) FiO2 (%):  [40 %] 40 % (06/07 1125)  General: lying in bed, NAD Neuro: Awake, alert, attempting to mouth his name, following simple commands, blink to threat bilaterally, PERRLA, no apparent facial asymmetry, 5/5 in all extremities.  Of note, patient has significant jaw tremor as well as tremor in bilateral upper extremities at baseline  Basic Metabolic Panel: Recent Labs  Lab 06/20/21 2304 06/25/21 0435 06/25/21 0441 06/25/21 0605 06/26/21 0015  NA 141 138 137  --  140  K 4.7 6.4* 6.2* 5.9* 4.1  CL 102 107  --   --  109  CO2 31 24  --   --  26  GLUCOSE 162* 195*  --   --  105*  BUN 20 22  --   --  26*  CREATININE 0.56* 0.66  --   --  0.53*  CALCIUM 9.8 9.7  --   --  9.0  MG 2.3 2.5*  --   --   --     CBC: Recent Labs  Lab 06/20/21 2245 06/25/21 0435 06/25/21 0441 06/26/21 0015  WBC 16.6* 26.7*  --  10.9*  NEUTROABS 15.1* 24.9*  --   --   HGB 11.4* 11.5* 12.9* 9.2*  HCT 36.3* 38.6* 38.0* 29.2*  MCV 92.6 96.3  --  91.8  PLT 359 340  --  237     Coagulation Studies: No results for input(s): LABPROT, INR in the last 72 hours.  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 -Breakthrough seizures likely due to UTI -Did not notice any significant  rigidity with the tremor.  Could be essential tremor  Recommendations -DC LTM as no seizures -Continue Keppra 1000 mg twice daily.  Not increasing dose during this admission because seizures were likely precipitated due to infection. -Recommend intranasal Valtoco 7.5 mg in each nostril (total 15 mg) for seizure lasting more than 5 minutes) can repeat once after 4 hours.  Do not use more than twice in 24 hours. -Continue seizure precautions -Management of rest of comorbidities per primary team -Follow-up with neurology in 3 months.  Will need further evaluation and possible management of tremor at that time - Discussed plan with Dr. Cyndia Skeeters  Seizure precautions:  Take showers instead of baths. Ensure the water temperature is not too high on the home water heater. Do not go swimming alone. Do not lock yourself in a room alone (i.e. bathroom). Maintain good sleep hygiene. Avoid alcohol.    If patient has another seizure, call 911 and bring them back to the ED if: A.  The seizure lasts longer than 5 minutes.      B.  The patient doesn't wake shortly after the seizure or has new problems such as difficulty seeing, speaking or moving following the seizure C.  The patient was injured during  the seizure D.  The patient has a temperature over 102 F (39C) E.  The patient vomited during the seizure and now is having trouble breathing    During the Seizure   - First, ensure adequate ventilation and place patients on the floor on their left side  Loosen clothing around the neck and ensure the airway is patent. If the patient is clenching the teeth, do not force the mouth open with any object as this can cause severe damage - Remove all items from the surrounding that can be hazardous. The patient may be oblivious to what's happening and may not even know what he or she is doing. If the patient is confused and wandering, either gently guide him/her away and block access to outside areas - Reassure the  individual and be comforting - Call 911. In most cases, the seizure ends before EMS arrives. However, there are cases when seizures may last over 3 to 5 minutes. Or the individual may have developed breathing difficulties or severe injuries. If a pregnant patient or a person with diabetes develops a seizure, it is prudent to call an ambulance. - Finally, if the patient does not regain full consciousness, then call EMS. Most patients will remain confused for about 45 to 90 minutes after a seizure, so you must use judgment in calling for help.   After the Seizure (Postictal Stage)   After a seizure, most patients experience confusion, fatigue, muscle pain and/or a headache. Thus, one should permit the individual to sleep. For the next few days, reassurance is essential. Being calm and helping reorient the person is also of importance.   Most seizures are painless and end spontaneously. Seizures are not harmful to others but can lead to complications such as stress on the lungs, brain and the heart. Individuals with prior lung problems may develop labored breathing and respiratory distress.    I have spent a total of   40  minutes with the patient reviewing hospital notes,  test results, labs and examining the patient as well as establishing an assessment and plan that was discussed personally with the patient.  > 50% of time was spent in direct patient care.   Zeb Comfort Epilepsy [facility] For questions after 5pm please refer to AMION to reach the Neurologist on call

## 2021-06-26 NOTE — Progress Notes (Signed)
EEG D/C'd. Patient did have some skin break down that the nurse was informed about. [facility] was notified.

## 2021-06-26 NOTE — Discharge Summary (Addendum)
Physician Discharge Summary  Austin Davis UEA:[id number] DOB: 23-Jun-1940 DOA: 06/25/2021  PCP: Aaron Edelman, MD  Admit date: 06/25/2021 Discharge date: 06/26/2021 [facility]  Recommendations for Outpatient Follow-up:  Follow ups as below. Please obtain CBC/CMP/Mag at follow up Closely monitor urine output Recommend palliative follow-up at [facility] Please follow up on the following pending results: None   Discharge Condition: Stable CODE STATUS: Full code (verified with daughter)   Hospital course 81 year old M with PMH of chronic hypoxic RF with trach, vent and G-tube dependence, combined CHF, seizure disorder, COPD, PAF s/p maze, DVT s/p IVC filter not on anticoagulation due to GI bleed, Parkinson disease, tremor, anxiety, hypothyroidism, hypertension and chronic indwelling Foley brought to the ED from [place] with seizure-like activity.  Had low temperature to 95.4 on arrival.  He had brief desaturations to 60s in ED that has improved after suctioning.  He had significant leukocytosis with mild lactic acidosis.  Hyperkalemic to 6.4.  UA concerning for UTI.  Cultures obtained.  Foley discontinued.  Patient was started on broad-spectrum antibiotics.  Neurology consulted.  He was loaded with Keppra and Ativan.  Stat EEG and LTM EEG did not show seizure or epileptiform discharge.  Pulmonology consulted and help with vent management.  The next day, patient improved tremendously.  He was awake and alert and conversant but difficult to understand due to underlying aphasia.  He follows commands appropriately.  Blood cultures NGTD.  Urine cultures with multiple species.  Leukocytosis almost resolved.  Hyperkalemia resolved.  He is discharged on IV meropenem for 4 more days for possible urinary tract infection given history of ESBL.      See individual problem list below for more on hospital course.  Problems addressed during this  hospitalization Seizure-like activity/acute toxic metabolic encephalopathy: No seizure activity or epileptiform discharge on LTM EEG.  He also have some tremors that could have been mistaken for seizure.  It is possible that he could have shivering in the setting of possible UTI/sepsis.  He had leukocytosis, low temperature and lactic acidosis on presentation.  His urinalysis is concerning for UTI.  He was hydrated and treated with broad-spectrum antibiotics for possible UTI.  Blood cultures negative.  Urine culture with multiple species.  He is currently awake and alert trying to converse but difficult due to aphasia.  He follows commands.  -Cleared for discharge by neurology -Continue home Keppra 1000 mg twice daily -Veltassa 15 mg intranasal as needed for seizure lasting more than 5 minutes -Discontinued Ativan. -Treat UTI and metabolic derangements as below   Severe sepsis due to catheter associated urinary tract infection: UA concerning.  Urine culture with multiple species.  Has history of Enterobacter angiogenesis and Pseudomonas aeruginosa.  Foley discontinued.  Blood culture NGTD.  Sepsis physiology resolving. -Discharged on IV meropenem for 4 more days -Foley discontinued.  He has been voiding without problem -Can consider condom catheter or PureWick -Consider prophylactic antibiotics after completing course of antibiotic if he has to go back on Foley catheter.    Chronic chronic respiratory failure with hypoxia (HCC) S/p tracheostomy and chronically vent dependent -Brief desaturation to 60s in ED that has resolved with function. -Currently stable on home vent setting. -Appreciate input by pulmonology   Hyperkalemia: Resolved.   Chronic hypotension Blood pressures currently stable. -Midodrine 10 mg 3 times daily per tube   Combined systolic and diastolic CHF: Stable. -Hold Lasix at least for a week   Gastrostomy tube dependent (  [place]) -Continue meds and Jevity 1.5 cal/fiber per  tube.   Hypothyroidism -Continue levothyroxine   Parkinsonism  Tremors -Continue propranolol daily as needed for tremors with hold parameters -Outpatient follow-up with neurology   Depression with anxiety -Continue home Lexapro and hydroxyzine   History of DVT s/p IVC filter   History of PAF not on anticoagulation due to GI bleed -Continue Inderal  Chronic Foley catheter-Foley discontinued.  He has been voiding without problem -Continue Flomax -Added low-dose bethanechol. -Closely monitor  Goal of care counseling -Discussed with patient's daughter over the phone.  She stated that patient is full code including CPR -CODE STATUS updated   Severe protein calorie malnutrition Nutrition Problem: Severe Malnutrition Etiology: chronic illness Signs/Symptoms: severe muscle depletion, severe fat depletion Interventions: Tube feeding, Prostat, MVI  Pressure Injury 05/08/21 Sacrum Medial;Lower Stage 1 -  Intact skin with non-blanchable redness of a localized area usually over a bony prominence. (Active)  05/08/21 0041  Location: Sacrum  Location Orientation: Medial;Lower  Staging: Stage 1 -  Intact skin with non-blanchable redness of a localized area usually over a bony prominence.  Wound Description (Comments):   Present on Admission: Yes  Dressing Type Foam - Lift dressing to assess site every shift 06/08/21 0800    Vital signs Vitals:   06/26/21 1100 06/26/21 1125 06/26/21 1331 06/26/21 1345  BP: 127/70   (!) 144/100  Pulse: (!) 55 (!) 54  62  Temp:      Resp: 15 18  17   SpO2: 100% 99% 100% 100%  TempSrc:         Discharge exam  GENERAL: No apparent distress.  Nontoxic. HEENT: MMM.  Vision and hearing grossly intact.  NECK: On ventilator via trach. RESP:  No IWOB.  Fair aeration bilaterally. CVS:  RRR. Heart sounds normal.  ABD/GI/GU: BS+. Abd soft, NTND.  PEG tube in place. MSK/EXT:  Moves extremities.  Significant muscle mass and subcu fat loss. SKIN: no  apparent skin lesion or wound NEURO: Awake and alert.  Seems to be fairly oriented.  Follows commands. No apparent focal neuro deficit other than expressive aphasia and resting tremor. PSYCH: Calm. Normal affect.   Discharge Instructions Discharge Instructions     Increase activity slowly   Complete by: As directed       Allergies as of 06/26/2021       Reactions   Prednisone Other (See Comments)   Makes "skin crawl," rapid HR- "Allergic," per [facility]   Cortisone Palpitations, Other (See Comments)   "Allergic," per [facility]        Medication List     STOP taking these medications    ALPRAZolam 0.5 MG tablet Commonly known as: XANAX       TAKE these medications    acetaminophen 650 MG CR tablet Commonly known as: TYLENOL 650 mg See admin instructions. 650 mg, per tube, every 8 hours as needed for moderate pain   bethanechol 10 MG tablet Commonly known as: URECHOLINE Take 1 tablet (10 mg total) by mouth 3 (three) times daily.   CULTURELLE PROBIOTICS PO Give 1 capsule by tube daily. 10 billion cell   escitalopram 10 MG tablet Commonly known as: LEXAPRO Place 10 mg into feeding tube daily.   esomeprazole 40 MG packet Commonly known as: NEXIUM 40 mg See admin instructions. 40 mg, per tube, every morning   feeding supplement (JEVITY 1.5 CAL/FIBER) Liqd Place 55 mL/hr into feeding tube continuous. What changed: Another medication with the same name was changed. Make  sure you understand how and when to take each.   feeding supplement (JEVITY 1.5 CAL/FIBER) Liqd Place 1,000 mLs into feeding tube continuous. What changed: how much to take   fexofenadine 180 MG tablet Commonly known as: ALLEGRA Place 180 mg into feeding tube daily.   fluticasone 50 MCG/ACT nasal spray Commonly known as: FLONASE Place 2 sprays into both nostrils in the morning and at bedtime.   folic acid 1 MG tablet Commonly known as: FOLVITE Take 1 tablet (1 mg total) by mouth daily. What changed:  how to take this   furosemide 20 MG tablet Commonly known as: LASIX Place 1 tablet (20 mg total) into feeding tube daily as needed for fluid or edema. Start taking on: July 03, 2021 What changed: These instructions start on July 03, 2021. If you are unsure what to do until then, ask your doctor or other care provider.   Geri-Tussin 100 MG/5ML liquid Generic drug: guaiFENesin Place 400 mg into feeding tube 3 (three) times daily.   hydrOXYzine 25 MG tablet Commonly known as: ATARAX Place 25 mg into feeding tube every 6 (six) hours as needed for anxiety.   lactulose 10 GM/15ML solution Commonly known as: CHRONULAC Place 20 g into feeding tube 2 (two) times daily as needed for mild constipation.   levalbuterol 1.25 MG/3ML nebulizer solution Commonly known as: XOPENEX Take 1.25 mg by nebulization every 6 (six) hours.   levETIRAcetam 100 MG/ML solution Commonly known as: KEPPRA Place 1,000 mg into feeding tube in the morning and at bedtime.   levothyroxine 25 MCG tablet Commonly known as: SYNTHROID Place 25 mcg into feeding tube daily before breakfast.   meropenem 1 g in sodium chloride 0.9 % 100 mL Inject 1 g into the vein every 8 (eight) hours for 4 days.   midodrine 10 MG tablet Commonly known as: PROAMATINE Place 1 tablet (10 mg total) into feeding tube 3 (three) times daily with meals.   multivitamin with minerals Tabs tablet Place 1 tablet into feeding tube daily.   ondansetron 4 MG disintegrating tablet Commonly known as: ZOFRAN-ODT 4 mg See admin instructions. 4 mg, per tube, every six hours as needed for nausea with vomiting   ondansetron 4 MG tablet Commonly known as: ZOFRAN Take 1 tablet (4 mg total) by mouth every 6 (six) hours as needed for nausea.   polyethylene glycol powder 17 GM/SCOOP powder Commonly known as: GLYCOLAX/MIRALAX Place 17 g into feeding tube See admin instructions. 17 grams, per tube, every 2 days   propranolol 10 MG tablet Commonly  known as: INDERAL Place 1 tablet (10 mg total) into feeding tube daily as needed (for tremors in AM. do not administer if heart rate is below 60 or systolic blood pressure is below 90.). What changed:  when to take this additional instructions   sodium chloride 0.65 % Soln nasal spray Commonly known as: OCEAN Place 1 spray into both nostrils every 4 (four) hours as needed (for allergic rhinitis).   Super Omega 3 EPA/DHA 1000 MG Caps Give 1,000 mg by tube daily.   tamsulosin 0.4 MG Caps capsule Commonly known as: FLOMAX 0.4 mg See admin instructions. 0.4 mg per tube at bedtime   Valtoco 5 MG Dose 5 MG/0.1ML Liqd Generic drug: diazePAM Give 15 mg (0.61ml) intranasal for seizure lasting more than 5 minutes can repeat once after 4 hours.  Do not use more than twice in 24 hours.   vitamin C 500 MG tablet Commonly known as: ASCORBIC ACID Place  500 mg into feeding tube daily.   zinc gluconate 50 MG tablet Place 50 mg into feeding tube 2 (two) times daily.        Consultations: Pulmonology Neurology  Procedures/Studies:   CT HEAD WO CONTRAST  Result Date: 06/04/2021 CLINICAL DATA:  Seizure today.  Altered mental status. EXAM: CT HEAD WITHOUT CONTRAST TECHNIQUE: Contiguous axial images were obtained from the base of the skull through the vertex without intravenous contrast. RADIATION DOSE REDUCTION: This exam was performed according to the departmental dose-optimization program which includes automated exposure control, adjustment of the mA and/or kV according to patient size and/or use of iterative reconstruction technique. COMPARISON:  05/08/2021 FINDINGS: Brain: No evidence of intracranial hemorrhage, acute infarction, hydrocephalus, extra-axial collection, or mass lesion/mass effect. Mild generalized cerebral atrophy is unchanged in appearance. Vascular:  No hyperdense vessel or other acute findings. Skull: No evidence of fracture or other significant bone abnormality.  Sinuses/Orbits:  No acute findings. Other: None. IMPRESSION: No acute intracranial abnormality. Stable mild cerebral atrophy. Electronically Signed   By: Marlaine Hind M.D.   On: 06/04/2021 21:07   DG CHEST PORT 1 VIEW  Result Date: 06/25/2021 CLINICAL DATA:  Tracheostomy EXAM: PORTABLE CHEST 1 VIEW COMPARISON:  Same day radiograph FINDINGS: Patient is rotated. Tracheostomy tube remains in place. Prior median sternotomy. Stable heart size. No focal airspace consolidation, pleural effusion, or pneumothorax. IMPRESSION: Stable exam.  No acute cardiopulmonary findings. Electronically Signed   By: Davina Poke D.O.   On: 06/25/2021 11:20   DG Chest Port 1 View  Result Date: 06/25/2021 CLINICAL DATA:  Seizures. EXAM: PORTABLE CHEST 1 VIEW COMPARISON:  06/20/2021. FINDINGS: The heart size and mediastinal contours are within normal limits. Lung volumes are low. Mild apical pleural thickening is noted bilaterally. No consolidation, effusion, or pneumothorax. Evaluation of the right lung apex is limited due to overlapping structures. A tracheostomy tube terminates 7.2 cm above the carina. Sternotomy wires are noted over the midline. No acute osseous abnormality. IMPRESSION: Lung volumes with no acute process. Electronically Signed   By: Brett Fairy M.D.   On: 06/25/2021 04:35   DG Chest Port 1 View  Result Date: 06/20/2021 CLINICAL DATA:  Seizure. EXAM: PORTABLE CHEST 1 VIEW COMPARISON:  Chest x-ray 06/07/2021 FINDINGS: The tip of the tracheostomy is at the level of the clavicular heads, unchanged. Median sternotomy wires are present. The cardiomediastinal silhouette is within normal limits. There is no focal lung infiltrate, pleural effusion or pneumothorax identified. No acute fractures are seen. IMPRESSION: No active disease. Electronically Signed   By: Ronney Asters M.D.   On: 06/20/2021 23:57   DG CHEST PORT 1 VIEW  Result Date: 06/07/2021 CLINICAL DATA:  Hypoxia EXAM: PORTABLE CHEST 1 VIEW COMPARISON:   06/04/2021 FINDINGS: Tracheostomy tube is noted in satisfactory position. Cardiac shadow is stable. Postsurgical changes are again noted. The lungs are well aerated bilaterally. No focal confluent infiltrate is seen. No bony abnormality is noted. IMPRESSION: No acute abnormality noted. Electronically Signed   By: Inez Catalina M.D.   On: 06/07/2021 19:15   DG Chest Port 1 View  Result Date: 06/04/2021 CLINICAL DATA:  Altered mental status. EXAM: PORTABLE CHEST 1 VIEW COMPARISON:  Chest radiograph dated 05/30/2021. FINDINGS: Tracheostomy above the carina. There is diffuse chronic intra coarsening. Bibasilar densities may represent atelectasis. Developing infiltrate is not excluded. No pleural effusion or pneumothorax. The cardiac silhouette is within limits. Median sternotomy wires. No acute osseous pathology. IMPRESSION: Bibasilar atelectasis versus less likely developing infiltrate. Electronically  Signed   By: Anner Crete M.D.   On: 06/04/2021 20:39   DG Chest Port 1 View  Result Date: 05/30/2021 CLINICAL DATA:  Seizures EXAM: PORTABLE CHEST 1 VIEW COMPARISON:  05/10/2021 FINDINGS: Prior CABG. Heart is normal size. Tracheostomy again noted. Left lower lobe opacity. Right lung clear. No effusions or acute bony abnormality. IMPRESSION: Left lower lobe opacity.  Cannot exclude pneumonia. Electronically Signed   By: Rolm Baptise M.D.   On: 05/30/2021 00:20   EEG adult  Result Date: 06/25/2021 Lora Havens, MD     06/26/2021  8:10 AM Patient Name: Austin Davis MRN: [id number] Epilepsy Attending: Lora Havens Referring Physician/Provider: Donnetta Simpers, MD Date: 06/25/2021 Duration: 29.45 mins Patient history: 81 y.o. male with PMH significant for tremor, paroxysmal atrial fibrillation not on anticoagulation, presumed seizures on Keppra 1000mg  BID who presents from his LTAC with 2 episodes of seizures with R gaze deviation. EEG to evaluate for seizure Level of alertness: Awake, asleep AEDs  during EEG study: LEV Technical aspects: This EEG study was done with scalp electrodes positioned according to the 10-20 International system of electrode placement. Electrical activity was acquired at a sampling rate of 500Hz  and reviewed with a high frequency filter of 70Hz  and a low frequency filter of 1Hz . EEG data were recorded continuously and digitally stored. Description: The posterior dominant rhythm consists of 8-9 Hz activity of moderate voltage (25-35 uV) seen predominantly in posterior head regions, symmetric and reactive to eye opening and eye closing. Sleep was characterized by vertex waves, sleep spindles (12 to 14 Hz), maximal frontocentral region.  Patient was noted to have near continuous lower jaw tremor when patient was awake.  Concomitant EEG before, during and after the event did not show any EEG changes suggest seizure. Hyperventilation and photic stimulation were not performed.   IMPRESSION: This study is within normal limits. No seizures or epileptiform discharges were seen throughout the recording. Patient was noted to have near continuous lower jaw tremor when patient was awake without concomitant EEG change.  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 Lora Havens   EEG adult  Result Date: 06/05/2021 Lora Havens, MD     06/05/2021  8:29 AM Patient Name: Austin Davis MRN: [id number] Epilepsy Attending: Lora Havens Referring Physician/Provider: Regan Lemming, MD Date: 06/04/2021 Duration: 21.36 mins Patient history: 81 year old male presented with seizure-like activity.  EEG evaluate for seizure. Level of alertness: lethargic AEDs during EEG study: LEV Technical aspects: This EEG study was done with scalp electrodes positioned according to the 10-20 International system of electrode placement. Electrical activity was acquired at a sampling rate of 500Hz  and reviewed with a high frequency filter of 70Hz  and a low frequency filter of 1Hz . EEG data were recorded continuously and  digitally stored. Description: EEG showed continuous generalized 3 to 7 Hz theta-delta slowing admixed with 15 to 18 Hz generalized beta activity. Hyperventilation and photic stimulation were not performed.   ABNORMALITY - Continuous slow, generalized IMPRESSION: This study is suggestive of severe diffuse encephalopathy, nonspecific etiology. No seizures or epileptiform discharges were seen throughout the recording. Lora Havens   EEG adult  Result Date: 05/30/2021 Lora Havens, MD     05/30/2021 10:45 AM Patient Name: Austin Davis MRN: [id number] Epilepsy Attending: Lora Havens Referring Physician/Provider: Donnetta Simpers, MD Date: 05/30/2021 Duration: 23 mins Patient history: 81 y.o. male with PMH significant for remor, paroxysmal atrial fibrillation not on anticoagulation, presumed seizures on Keppra 750mg  BID who  presents from his LTAC with atleast a 40 mins episode concerning for prolonged seizures x 2.  EEG to evaluate for seizure. Level of alertness: Awake AEDs during EEG study: LEV Technical aspects: This EEG study was done with scalp electrodes positioned according to the 10-20 International system of electrode placement. Electrical activity was acquired at a sampling rate of 500Hz  and reviewed with a high frequency filter of 70Hz  and a low frequency filter of 1Hz . EEG data were recorded continuously and digitally stored. Description: The posterior dominant rhythm consists of 8 Hz activity of moderate voltage (25-35 uV) seen predominantly in posterior head regions, symmetric and reactive to eye opening and eye closing. Hyperventilation and photic stimulation were not performed.   Patient was noted to have near continuous mouth tremor throughout the study. Concomitant EEG before, during and after the event did not show any EEG change to suggest seizure.  IMPRESSION: This study is within normal limits. No seizures or epileptiform discharges were seen throughout the recording.  Patient  was noted to have mouth tremor throughout the study without concomitant EEG change.  These episodes were nonepileptic.  Priyanka Barbra Sarks   Overnight EEG with video  Result Date: 06/26/2021 Lora Havens, MD     06/26/2021 10:01 AM Patient Name: Austin Davis MRN: [id number] Epilepsy Attending: Lora Havens Referring Physician/Provider: Donnetta Simpers, MD Duration: 06/25/2021 1117 to 06/26/2021 0930  Patient history: 81 y.o. male with PMH significant for tremor, paroxysmal atrial fibrillation not on anticoagulation, presumed seizures on Keppra 1000mg  BID who presents from his LTAC with 2 episodes of seizures with R gaze deviation. EEG to evaluate for seizure  Level of alertness: Awake, asleep  AEDs during EEG study: LEV  Technical aspects: This EEG study was done with scalp electrodes positioned according to the 10-20 International system of electrode placement. Electrical activity was acquired at a sampling rate of 500Hz  and reviewed with a high frequency filter of 70Hz  and a low frequency filter of 1Hz . EEG data were recorded continuously and digitally stored.  Description: The posterior dominant rhythm consists of 8-9 Hz activity of moderate voltage (25-35 uV) seen predominantly in posterior head regions, symmetric and reactive to eye opening and eye closing. Sleep was characterized by vertex waves, sleep spindles (12 to 14 Hz), maximal frontocentral region.   Patient was noted to have near continuous lower jaw tremor when patient was awake.  Concomitant EEG before, during and after the event did not show any EEG changes suggest seizure.  Hyperventilation and photic stimulation were not performed.    IMPRESSION: This study is within normal limits. No seizures or epileptiform discharges were seen throughout the recording.  Patient was noted to have near continuous lower jaw tremor when patient was awake without concomitant EEG change.  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 Lora Havens       The  results of significant diagnostics from this hospitalization (including imaging, microbiology, ancillary and laboratory) are listed below for reference.     Microbiology: Recent Results (from the past 240 hour(s))  Blood culture (routine x 2)     Status: None   Collection Time: 06/21/21 12:10 AM   Specimen: BLOOD RIGHT ARM  Result Value Ref Range Status   Specimen Description BLOOD RIGHT ARM  Final   Special Requests   Final    BOTTLES DRAWN AEROBIC AND ANAEROBIC Blood Culture adequate volume   Culture   Final    NO GROWTH 5 DAYS Performed at [facility], [address] [place], [place] [address]    Report Status 06/26/2021 FINAL  Final  Blood culture (routine x 2)     Status: None   Collection Time: 06/21/21  1:20 AM   Specimen: BLOOD  Result Value Ref Range Status   Specimen Description BLOOD RIGHT ANTECUBITAL  Final   Special Requests   Final    BOTTLES DRAWN AEROBIC AND ANAEROBIC Blood Culture results may not be optimal due to an excessive volume of blood received in culture bottles   Culture   Final    NO GROWTH 5 DAYS Performed at [facility], [place] [address], [place], [place] [address]    Report Status 06/26/2021 FINAL  Final  Urine Culture     Status: Abnormal   Collection Time: 06/21/21  2:45 AM   Specimen: Urine, Clean Catch  Result Value Ref Range Status   Specimen Description URINE, CLEAN CATCH  Final   Special Requests   Final    NONE Performed at [facility], [place] [address], [place], [place] [address]    Culture (A)  Final    >=100,000 COLONIES/mL ENTEROBACTER AEROGENES 60,000 COLONIES/mL PSEUDOMONAS AERUGINOSA    Report Status 06/24/2021 FINAL  Final   Organism ID, Bacteria ENTEROBACTER AEROGENES (A)  Final   Organism ID, Bacteria PSEUDOMONAS AERUGINOSA (A)  Final      Susceptibility   Enterobacter aerogenes - MIC*    CEFAZOLIN >=64 RESISTANT Resistant     CEFEPIME >=32 RESISTANT Resistant     CEFTRIAXONE >=64 RESISTANT Resistant      CIPROFLOXACIN 2 RESISTANT Resistant     GENTAMICIN >=16 RESISTANT Resistant     IMIPENEM 1 SENSITIVE Sensitive     NITROFURANTOIN 64 INTERMEDIATE Intermediate     TRIMETH/SULFA >=320 RESISTANT Resistant     PIP/TAZO 16 SENSITIVE Sensitive     * >=100,000 COLONIES/mL ENTEROBACTER AEROGENES   Pseudomonas aeruginosa - MIC*    CEFTAZIDIME 4 SENSITIVE Sensitive     CIPROFLOXACIN <=0.25 SENSITIVE Sensitive     GENTAMICIN 4 SENSITIVE Sensitive     IMIPENEM <=0.25 SENSITIVE Sensitive     PIP/TAZO 8 SENSITIVE Sensitive     CEFEPIME 2 SENSITIVE Sensitive     * 60,000 COLONIES/mL PSEUDOMONAS AERUGINOSA  Culture, blood (routine x 2)     Status: None (Preliminary result)   Collection Time: 06/25/21  5:30 AM   Specimen: BLOOD RIGHT FOREARM  Result Value Ref Range Status   Specimen Description BLOOD RIGHT FOREARM  Final   Special Requests   Final    BOTTLES DRAWN AEROBIC AND ANAEROBIC Blood Culture results may not be optimal due to an inadequate volume of blood received in culture bottles   Culture   Final    NO GROWTH 1 DAY Performed at [facility], [address] [place], [place] [address]    Report Status PENDING  Incomplete  Urine Culture     Status: Abnormal   Collection Time: 06/25/21  5:30 AM   Specimen: Urine, Catheterized  Result Value Ref Range Status   Specimen Description URINE, CATHETERIZED  Final   Special Requests   Final    NONE Performed at [facility], [place] [address], [place], [place] [address]    Culture MULTIPLE SPECIES PRESENT, SUGGEST RECOLLECTION (A)  Final   Report Status 06/26/2021 FINAL  Final  Culture, blood (routine x 2)     Status: None (Preliminary result)   Collection Time: 06/25/21  6:05 AM  Specimen: BLOOD RIGHT HAND  Result Value Ref Range Status   Specimen Description BLOOD RIGHT HAND  Final   Special Requests   Final    AEROBIC BOTTLE ONLY Blood Culture results may not be optimal due to an inadequate volume of blood received in  culture bottles   Culture   Final    NO GROWTH 1 DAY Performed at [facility] [address], [place], [place] [address]    Report Status PENDING  Incomplete  MRSA Next Gen by PCR, Nasal     Status: None   Collection Time: 06/25/21  8:34 AM   Specimen: Nasal Mucosa; Nasal Swab  Result Value Ref Range Status   MRSA by PCR Next Gen NOT DETECTED NOT DETECTED Final    Comment: (NOTE) The GeneXpert MRSA Assay (FDA approved for NASAL specimens only), is one component of a comprehensive MRSA colonization surveillance program. It is not intended to diagnose MRSA infection nor to guide or monitor treatment for MRSA infections. Test performance is not FDA approved in patients less than 58 years old. Performed at [facility], [place] [address] [place], [place] [address]      Labs:  CBC: Recent Labs  Lab 06/20/21 2245 06/25/21 0435 06/25/21 0441 06/26/21 0015  WBC 16.6* 26.7*  --  10.9*  NEUTROABS 15.1* 24.9*  --   --   HGB 11.4* 11.5* 12.9* 9.2*  HCT 36.3* 38.6* 38.0* 29.2*  MCV 92.6 96.3  --  91.8  PLT 359 340  --  237   BMP &GFR Recent Labs  Lab 06/20/21 2304 06/25/21 0435 06/25/21 0441 06/25/21 0605 06/26/21 0015  NA 141 138 137  --  140  K 4.7 6.4* 6.2* 5.9* 4.1  CL 102 107  --   --  109  CO2 31 24  --   --  26  GLUCOSE 162* 195*  --   --  105*  BUN 20 22  --   --  26*  CREATININE 0.56* 0.66  --   --  0.53*  CALCIUM 9.8 9.7  --   --  9.0  MG 2.3 2.5*  --   --   --    Estimated Creatinine Clearance: 57.3 mL/min (A) (by C-G formula based on SCr of 0.53 mg/dL (L)). Liver & Pancreas: Recent Labs  Lab 06/20/21 2304 06/25/21 0435  AST 25 31  ALT 33 42  ALKPHOS 118 143*  BILITOT 0.3 0.4  PROT 7.4 7.9  ALBUMIN 3.4* 3.7   No results for input(s): LIPASE, AMYLASE in the last 168 hours. No results for input(s): AMMONIA in the last 168 hours. Diabetic: No results for input(s): HGBA1C in the last 72 hours. Recent Labs  Lab 06/25/21 1947  06/25/21 2334 06/26/21 0341 06/26/21 0733 06/26/21 1159  GLUCAP 118* 114* 102* 144* 96   Cardiac Enzymes: No results for input(s): CKTOTAL, CKMB, CKMBINDEX, TROPONINI in the last 168 hours. No results for input(s): PROBNP in the last 8760 hours. Coagulation Profile: No results for input(s): INR, PROTIME in the last 168 hours. Thyroid Function Tests: No results for input(s): TSH, T4TOTAL, FREET4, T3FREE, THYROIDAB in the last 72 hours. Lipid Profile: No results for input(s): CHOL, HDL, LDLCALC, TRIG, CHOLHDL, LDLDIRECT in the last 72 hours. Anemia Panel: No results for input(s): VITAMINB12, FOLATE, FERRITIN, TIBC, IRON, RETICCTPCT in the last 72 hours. Urine analysis:    Component Value Date/Time   COLORURINE AMBER (A) 06/25/2021 0530   APPEARANCEUR TURBID (A) 06/25/2021 0530  LABSPEC 1.018 06/25/2021 0530   PHURINE 5.0 06/25/2021 0530   GLUCOSEU NEGATIVE 06/25/2021 0530   HGBUR SMALL (A) 06/25/2021 0530   BILIRUBINUR NEGATIVE 06/25/2021 0530   KETONESUR NEGATIVE 06/25/2021 0530   PROTEINUR 100 (A) 06/25/2021 0530   UROBILINOGEN 0.2 09/25/2006 1500   NITRITE NEGATIVE 06/25/2021 0530   LEUKOCYTESUR LARGE (A) 06/25/2021 0530   Sepsis Labs: Invalid input(s): PROCALCITONIN, LACTICIDVEN   Time coordinating discharge: 45 minutes  SIGNED:  Mercy Riding, MD  [facility] 06/26/2021, 2:06 PM

## 2021-06-26 NOTE — Progress Notes (Signed)
LTM maint complete - no skin breakdown under: Fp1 Fp2 [facility] monitored, Event button test confirmed by [facility].

## 2021-06-26 NOTE — Assessment & Plan Note (Signed)
Chronic jaw tremor.  EEG negative.   - No evidence of seizure disorder - Tremor not seizure related.  - Stop Keppra - Can be discharged.

## 2021-06-26 NOTE — Assessment & Plan Note (Signed)
-   Trial of SBT today.

## 2021-06-26 NOTE — Assessment & Plan Note (Signed)
-   Continue home levothyroxine replacement.

## 2021-06-26 NOTE — TOC Transition Note (Signed)
Transition of Care [facility]) - CM/SW Discharge Note   Patient Details  Name: Austin Davis MRN: [id number] Date of Birth: 09/13/1940  [facility] of [facility] CM/SW Contact:  Benard Halsted, LCSW Phone Number: 06/26/2021, 3:06 PM   Clinical Narrative:    Patient will DC to: [facility] Anticipated DC date: 06/26/21 Family notified: Daughter Transport by: Karen Chafe   Per MD patient ready for DC to [facility]. RN to call report prior to discharge ([phone] room 310, accepting MD Dr. Sheppard Coil). RN, patient, patient's family, and facility notified of DC. Discharge Summary emailed to facility. DC packet on chart. Ambulance transport requested for patient.   CSW will sign off for now as social work intervention is no longer needed. Please consult [place] again if new needs arise.     Final next level of care: Skilled Nursing Facility Barriers to Discharge: Barriers Resolved   Patient Goals and CMS Choice Patient states their goals for this hospitalization and ongoing recovery are:: Return to SNF CMS Medicare.gov Compare Post Acute Care list provided to:: Patient Represenative (must comment) Choice offered to / list presented to : Adult Children  Discharge Placement              Patient chooses bed at:  ([facility]) Patient to be transferred to facility by: [place] Name of family member notified: Daughter Patient and family notified of of transfer: 06/26/21  Discharge Plan and Services In-house Referral: Clinical Social Work   Post Acute Care Choice: [place]                               Social Determinants of Health (SDOH) Interventions     Readmission Risk Interventions    06/26/2021   11:10 AM  Readmission Risk Prevention Plan  Transportation Screening Complete  Medication Review Press photographer) Complete  PCP or Specialist appointment within 3-5 days of discharge Complete  HRI or Home Care Consult Complete  SW Recovery  Care/Counseling Consult Complete  Palliative Care Screening Not [place] Complete

## 2021-06-26 NOTE — Care Management Obs Status (Signed)
[place] NOTIFICATION   Patient Details  Name: Austin Davis MRN: [id number] Date of Birth: May 12, 1940   Medicare Observation Status Notification Given:  Yes    Ella Bodo, RN 06/26/2021, 2:27 PM

## 2021-06-26 NOTE — Progress Notes (Signed)
Report called to [facility] LPN at [place]. [facility] here to transport pt. Daughter aware of transfer.

## 2021-06-26 NOTE — TOC Initial Note (Signed)
Transition of Care [facility]) - Initial/Assessment Note    Patient Details  Name: Austin Davis MRN: [id number] Date of Birth: 06-03-1940  Transition of Care [facility]) CM/SW Contact:    Benard Halsted, LCSW Phone Number: 06/26/2021, 11:11 AM  Clinical Narrative:                 Patient came from [place]. CSW spoke with Levada Dy there and she reported that she will only need a DC Summary emailed to her at discharge ([email or web address]). CSW will continue to follow. Patient will require [facility] for transport.   Expected Discharge Plan: Skilled Nursing Facility Barriers to Discharge: Continued Medical Work up   Patient Goals and CMS Choice Patient states their goals for this hospitalization and ongoing recovery are:: Return to SNF CMS Medicare.gov Compare Post Acute Care list provided to:: Patient Represenative (must comment) Choice offered to / list presented to : Adult Children  Expected Discharge Plan and Services Expected Discharge Plan: [place] In-house Referral: Clinical Social Work   Post Acute Care Choice: [place] Living arrangements for the past 2 months: [place]                                      Prior Living Arrangements/Services Living arrangements for the past 2 months: [place] Lives with:: Facility Resident Patient language and need for interpreter reviewed:: Yes Do you feel safe going back to the place where you live?: Yes      Need for Family Participation in Patient Care: Yes (Comment) Care giver support system in place?: Yes (comment)   Criminal Activity/Legal Involvement Pertinent to Current Situation/Hospitalization: No - Comment as needed  Activities of Daily Living      Permission Sought/Granted Permission sought to share information with : Facility Sport and exercise psychologist, Family Supports Permission granted to share information with : No  Share Information with  NAME: [name]  Permission granted to share info w AGENCY: [facility]  Permission granted to share info w Relationship: Daughter  Permission granted to share info w Contact Information: [phone]  Emotional Assessment Appearance:: Appears stated age Attitude/Demeanor/Rapport: Unable to Assess Affect (typically observed): Unable to Assess Orientation: :  (intubated) Alcohol / Substance Use: Not Applicable Psych Involvement: No (comment)  Admission diagnosis:  Hyperkalemia [E87.5] Seizure ([place]) [R56.9] Seizures ([place]) [R56.9] Leukocytosis, unspecified type [D72.829] Patient Active Problem List   Diagnosis Date Noted   Seizures ([place]) 06/25/2021   Sepsis ([place]) 06/25/2021   Cardiac arrest due to respiratory disorder ([place]) 06/07/2021   Chronic respiratory failure with hypoxia ([place]) 06/04/2021   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   Impaired quality of life 05/30/2021   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 05/15/2021   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[place]) 05/08/2021   Pneumonia 05/08/2021   Hyperkalemia 05/08/2021   Urinary tract infection associated with indwelling urethral catheter ([place]) 05/08/2021   Encephalopathy 05/08/2021   Pressure injury of skin 04/30/2021   Seizure disorder ([place]) 04/29/2021   Pneumothorax 04/11/2021   Ventilator dependent ([place])    Aspiration pneumonia ([place]) 03/25/2021   Parkinsonism ([place]) 03/25/2021   Obstructive sleep apnea 10/24/2020   Chronic obstructive pulmonary disease ([place]) 10/24/2020   Chronic heart failure with preserved ejection fraction ([place]) 10/24/2020   Unintentional weight loss 80/32/1224   Acute metabolic encephalopathy 82/50/0370   SVT (supraventricular tachycardia) ([place]) 09/20/2020  Protein-calorie malnutrition, severe 09/17/2020   Acute on chronic respiratory failure with hypoxia and hypercapnia (HCC) 09/16/2020   Failure to thrive in adult 09/16/2020   Stage 1 skin ulcer  of sacral region [facility]) 09/16/2020   Chronic hypotension 06/30/2020   History of GI bleed 02/17/2017   Rectal bleeding 02/16/2017   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   Essential tremor 02/19/2015   Status post mitral valve annuloplasty and MAZE 2005 12/12/2014   HLD (hyperlipidemia) 05/05/2013   DOE (dyspnea on exertion) 04/22/2013   Fatigue 04/22/2013   Paroxysmal atrial fibrillation ([place]) 05/08/2012   PCP:  Aaron Edelman, MD Pharmacy:   [facility], [address] [place] [place] [address] Phone: [phone] Fax: [phone]     Social Determinants of Health (SDOH) Interventions    Readmission Risk Interventions    06/26/2021   11:10 AM  Readmission Risk Prevention Plan  Transportation Screening Complete  Medication Review (RN Care Manager) Complete  PCP or Specialist appointment within 3-5 days of discharge Complete  HRI or Home Care Consult Complete  SW Recovery Care/Counseling Consult Complete  [place] Complete

## 2021-06-26 NOTE — Hospital Course (Addendum)
81 year old M with PMH of chronic hypoxic RF with trach, vent and G-tube dependence, combined CHF, seizure disorder, COPD, PAF s/p maze, DVT s/p IVC filter not on anticoagulation due to GI bleed, Parkinson disease, tremor, anxiety, hypothyroidism, hypertension and chronic indwelling Foley brought to the ED from LTAC with seizure-like activity.  Had low temperature to 95.4 on arrival.  He had brief desaturations to 60s in ED that has improved after suctioning.  He had significant leukocytosis with mild lactic acidosis.  Hyperkalemic to 6.4.  UA concerning for UTI.  Cultures obtained.  Foley discontinued.  Patient was started on broad-spectrum antibiotics.  Neurology consulted.  He was loaded with Keppra and Ativan.  Stat EEG and LTM EEG did not show seizure or epileptiform discharge.  Pulmonology consulted and help with vent management.  The next day, patient improved tremendously.  He was awake and alert and conversant but difficult to understand due to underlying aphasia.  He follows commands appropriately.  Blood cultures NGTD.  Urine cultures with multiple species.  Leukocytosis almost resolved.  Hyperkalemia resolved.  He is discharged on IV meropenem for 4 more days for possible urinary tract infection given history of ESBL.

## 2021-06-26 NOTE — Assessment & Plan Note (Addendum)
Has tolerated Foley removal.   - Leave Foley out.  - Complete 7 days meropenem

## 2021-06-26 NOTE — Progress Notes (Signed)
LTM maint complete - no skin breakdown  Fp2 serviced. [facility] monitored.

## 2021-06-26 NOTE — Assessment & Plan Note (Signed)
Likely accounts for tremor.  Presently follows commands and moves all 4 limbs.   - Consider neurology consultation to assess regarding other treatments for tremors.

## 2021-06-26 NOTE — Procedures (Addendum)
Patient Name: Austin Davis  MRN: [id number]  Epilepsy Attending: Lora Havens  Referring Physician/Provider: Donnetta Simpers, MD Duration: 06/25/2021 1117 to 06/26/2021 0930   Patient history: 81 y.o. male with PMH significant for tremor, paroxysmal atrial fibrillation not on anticoagulation, presumed seizures on Keppra 1000mg  BID who presents from his LTAC with 2 episodes of seizures with R gaze deviation. EEG to evaluate for seizure   Level of alertness: Awake, asleep   AEDs during EEG study: LEV   Technical aspects: This EEG study was done with scalp electrodes positioned according to the 10-20 International system of electrode placement. Electrical activity was acquired at a sampling rate of 500Hz  and reviewed with a high frequency filter of 70Hz  and a low frequency filter of 1Hz . EEG data were recorded continuously and digitally stored.    Description: The posterior dominant rhythm consists of 8-9 Hz activity of moderate voltage (25-35 uV) seen predominantly in posterior head regions, symmetric and reactive to eye opening and eye closing. Sleep was characterized by vertex waves, sleep spindles (12 to 14 Hz), maximal frontocentral region.     Patient was noted to have near continuous lower jaw tremor when patient was awake.  Concomitant EEG before, during and after the event did not show any EEG changes suggest seizure.   Hyperventilation and photic stimulation were not performed.      IMPRESSION: This study is within normal limits. No seizures or epileptiform discharges were seen throughout the recording.   Patient was noted to have near continuous lower jaw tremor when patient was awake without concomitant EEG change.  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  Madelyne Millikan Barbra Sarks

## 2021-06-26 NOTE — Progress Notes (Addendum)
NAME:  AWAB ABEBE, MRN:  [id number], DOB:  July 04, 1940, LOS: 1 ADMISSION DATE:  06/25/2021, CONSULTATION DATE: 6/6 REFERRING MD:  Dr. Baldo Ash, CHIEF COMPLAINT: Reported seizures  History of Present Illness:  81 y/o M who presented to [facility] ER on 6/6 with concern for seizures.   The patient is a [facility] resident rehab side.  At baseline, he has tremor of his jaw and all extremities.   At baseline, the patient can interact by mouthing words and is typically oriented x3.  Per [facility] staff, the patient reportedly had seizure activity for approximately 20 minutes around 9pm on 6/5 then returned to his baseline.  They canceled the EMS call.  He subsequently had additional seizure activity around 2am that did not stop and EMS was activated.  He reportedly was staring into space with right gaze deviation and had a mild tremble.  Staff reported no recent illness, fevers or complaints.  They have reportedly noted increased seizure activity. He was seen by Neurology on admit.  He is well known to their service - prior EEG with no significant abnormalities, but none of his seizures have been captured on continuous EEG.  The patient was given ativan for seizure in the ER.  He was admitted per [facility]. The patient was pan cultured.  Initial labs Na 138, K 6.4, Cl 107, CO2 24, glucose 195, BUN 22, Sr Cr 0.66, Mg 2.5, alk phos 143, WBC 26.7, Hgb 11.5, platelets 340. CXR showed trach in position but slightly high with hyperinflation of cuff, mild basilar atelectasis.   PCCM consulted for chronic vent management.   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 Study within normal limits, no seizures or epileptiform discharges.   Interim History / Subjective:  Afebrile  EEG leads in place for cEEG Remains on baseline settings  No acute events overnight   Objective   Blood pressure 113/67, pulse 64, temperature 99.4 F (37.4 C), temperature source Axillary, resp. rate 16, SpO2 98 %.    Vent Mode: PCV FiO2 (%):  [40 %] 40 % Set Rate:  [14 bmp] 14 bmp PEEP:  [8 cmH20] 8 cmH20 Plateau Pressure:  [16 cmH20-27 cmH20] 27 cmH20   Intake/Output Summary (Last 24 hours) at 06/26/2021 0848 Last data filed at 06/26/2021 0600 Gross per 24 hour  Intake 963.33 ml  Output 500 ml  Net 463.33 ml   There were no vitals filed for this visit.  Examination: General: elderly adult male lying in bed in NAD HEENT: MM pink/moist, #6 distal XLT midline c/d/i, pupils equal/reactive Neuro: awakens to voice, speech clear, follows commands (thumbs up, moves BLE to command), attempts to communicate by mouthing words CV: s1s2 RRR, no m/r/g PULM: non-labored at rest, lungs bilaterally with wheezing  GI: soft, bsx4 active  Extremities: warm/dry, no edema  Skin: no rashes or lesions  Resolved Hospital Problem list      Assessment & Plan:   Chronic Hypoxic Respiratory Failure with Tracheostomy Status Multiple prior attempts during previous admissions at finding an  improved fit for trach including #6 [place], [place] trach were not successful.  Needs #6 XLT Distal.  -PCV for chronic vent support  -wean PEEP / fiO2 for sats >90% -ok to trial PSV, but has been on full support so long, doubt he would be able to wean  -CXR changes of balloon distention noted dating back for some time, balloon with appropriate volume on assessment. Avoid over distention.  -trach care per protocol  -follow intermittent CXR  -keep trach ties securely fixed  -guaifenesin per  tube  -add duoneb TID  -xopenex PRN  Questionable Seizures  Acute Metabolic Encephalopathy  -appreciate Neuro, [facility]  -continuous EEG in place, no seizures identified  -continue keppra   UTI  -per [facility]  Chronic Hypotension  -midodrine    Known Hx of Spiculated Lung Nodule -not a candidate for further work up based on prior evaluation    Best Practice (right click and "Reselect all SmartList Selections" daily)  Per Primary / [facility]    From pulmonary standpoint, could go back to [facility]   Critical care time: 30 minutes    Noe Gens, MSN, APRN, NP-C, AGACNP-BC [facility] 06/26/2021, 8:48 AM   Please see [email or web address] for pager details.   From 7A-7P if no response, please call [phone] After hours, please call ELink [phone]

## 2021-06-26 NOTE — Assessment & Plan Note (Signed)
Continue tube feeds.

## 2021-06-26 NOTE — Care Management CC44 (Addendum)
Condition Code 44 Documentation Completed  Patient Details  Name: Austin Davis MRN: [id number] Date of Birth: Mar 30, 1940   Condition Code 44 given:  Yes  Patient signature on Condition Code 44 notice:  Yes Documentation of 2 MD's agreement:  Yes Code 44 added to claim:  Yes  Code 44 reviewed with patient's daughter, [name], and emailed to her at [email or web address].  She asked for contact to assist with establishing care with the [employer].  Daughter given phone number for [facility].  She is appreciative of assistance.    [facility], RN 06/26/2021, 2:27 PM

## 2021-06-27 DIAGNOSIS — J9620 Acute and chronic respiratory failure, unspecified whether with hypoxia or hypercapnia: Secondary | ICD-10-CM | POA: Diagnosis not present

## 2021-06-30 LAB — CULTURE, BLOOD (ROUTINE X 2)
Culture: NO GROWTH
Culture: NO GROWTH

## 2021-07-10 DIAGNOSIS — M79675 Pain in left toe(s): Secondary | ICD-10-CM | POA: Diagnosis not present

## 2021-07-10 DIAGNOSIS — L84 Corns and callosities: Secondary | ICD-10-CM | POA: Diagnosis not present

## 2021-07-10 DIAGNOSIS — M2041 Other hammer toe(s) (acquired), right foot: Secondary | ICD-10-CM | POA: Diagnosis not present

## 2021-07-10 DIAGNOSIS — I739 Peripheral vascular disease, unspecified: Secondary | ICD-10-CM | POA: Diagnosis not present

## 2021-07-10 DIAGNOSIS — B351 Tinea unguium: Secondary | ICD-10-CM | POA: Diagnosis not present

## 2021-07-14 DIAGNOSIS — J9620 Acute and chronic respiratory failure, unspecified whether with hypoxia or hypercapnia: Secondary | ICD-10-CM | POA: Diagnosis not present

## 2021-07-25 DIAGNOSIS — J9620 Acute and chronic respiratory failure, unspecified whether with hypoxia or hypercapnia: Secondary | ICD-10-CM | POA: Diagnosis not present

## 2021-07-28 DIAGNOSIS — J9620 Acute and chronic respiratory failure, unspecified whether with hypoxia or hypercapnia: Secondary | ICD-10-CM | POA: Diagnosis not present

## 2021-08-03 ENCOUNTER — Emergency Department (HOSPITAL_COMMUNITY)
Admission: EM | Admit: 2021-08-03 | Discharge: 2021-08-04 | Disposition: A | Discharge status: Home / Self Care | Payer: Medicare Other | Attending: Emergency Medicine | Admitting: Emergency Medicine

## 2021-08-03 ENCOUNTER — Other Ambulatory Visit: Payer: Self-pay

## 2021-08-03 DIAGNOSIS — N390 Urinary tract infection, site not specified: Secondary | ICD-10-CM | POA: Diagnosis not present

## 2021-08-03 DIAGNOSIS — R790 Abnormal level of blood mineral: Secondary | ICD-10-CM | POA: Insufficient documentation

## 2021-08-03 DIAGNOSIS — D72829 Elevated white blood cell count, unspecified: Secondary | ICD-10-CM | POA: Insufficient documentation

## 2021-08-03 DIAGNOSIS — Z79899 Other long term (current) drug therapy: Secondary | ICD-10-CM | POA: Diagnosis not present

## 2021-08-03 DIAGNOSIS — R9431 Abnormal electrocardiogram [ECG] [EKG]: Secondary | ICD-10-CM | POA: Diagnosis not present

## 2021-08-03 DIAGNOSIS — I480 Paroxysmal atrial fibrillation: Secondary | ICD-10-CM | POA: Diagnosis not present

## 2021-08-03 DIAGNOSIS — R569 Unspecified convulsions: Secondary | ICD-10-CM | POA: Diagnosis not present

## 2021-08-03 LAB — COMPREHENSIVE METABOLIC PANEL
ALT: 45 U/L — ABNORMAL HIGH (ref 0–44)
AST: 29 U/L (ref 15–41)
Albumin: 3.4 g/dL — ABNORMAL LOW (ref 3.5–5.0)
Alkaline Phosphatase: 174 U/L — ABNORMAL HIGH (ref 38–126)
Anion gap: 12 (ref 5–15)
BUN: 25 mg/dL — ABNORMAL HIGH (ref 8–23)
CO2: 25 mmol/L (ref 22–32)
Calcium: 9.5 mg/dL (ref 8.9–10.3)
Chloride: 98 mmol/L (ref 98–111)
Creatinine, Ser: 0.64 mg/dL (ref 0.61–1.24)
GFR, Estimated: >60 mL/min (ref >60)
Glucose, Bld: 161 mg/dL — ABNORMAL HIGH (ref 70–99)
Potassium: 4.9 mmol/L (ref 3.5–5.1)
Sodium: 135 mmol/L (ref 135–145)
Total Bilirubin: 0.3 mg/dL (ref 0.3–1.2)
Total Protein: 8.3 g/dL — ABNORMAL HIGH (ref 6.5–8.1)

## 2021-08-03 LAB — I-STAT ARTERIAL BLOOD GAS, ED
Acid-Base Excess: 2 mmol/L (ref 0.0–2.0)
Bicarbonate: 26.5 mmol/L (ref 20.0–28.0)
Calcium, Ion: 1.28 mmol/L (ref 1.15–1.40)
HCT: 36 % — ABNORMAL LOW (ref 39.0–52.0)
Hemoglobin: 12.2 g/dL — ABNORMAL LOW (ref 13.0–17.0)
O2 Saturation: 88 %
Patient temperature: 98.3
Potassium: 4.9 mmol/L (ref 3.5–5.1)
Sodium: 138 mmol/L (ref 135–145)
TCO2: 28 mmol/L (ref 22–32)
pCO2 arterial: 38.1 mmHg (ref 32–48)
pH, Arterial: 7.45 (ref 7.35–7.45)
pO2, Arterial: 52 mmHg — ABNORMAL LOW (ref 83–108)

## 2021-08-03 LAB — CBC WITH DIFFERENTIAL/PLATELET
Abs Immature Granulocytes: 0.09 10*3/uL — ABNORMAL HIGH (ref 0.00–0.07)
Basophils Absolute: 0 10*3/uL (ref 0.0–0.1)
Basophils Relative: 0 %
Eosinophils Absolute: 0 10*3/uL (ref 0.0–0.5)
Eosinophils Relative: 0 %
HCT: 38.7 % — ABNORMAL LOW (ref 39.0–52.0)
Hemoglobin: 12 g/dL — ABNORMAL LOW (ref 13.0–17.0)
Immature Granulocytes: 1 %
Lymphocytes Relative: 2 %
Lymphs Abs: 0.4 10*3/uL — ABNORMAL LOW (ref 0.7–4.0)
MCH: 28.1 pg (ref 26.0–34.0)
MCHC: 31 g/dL (ref 30.0–36.0)
MCV: 90.6 fL (ref 80.0–100.0)
Monocytes Absolute: 0.7 10*3/uL (ref 0.1–1.0)
Monocytes Relative: 4 %
Neutro Abs: 16.2 10*3/uL — ABNORMAL HIGH (ref 1.7–7.7)
Neutrophils Relative %: 93 %
Platelets: 376 10*3/uL (ref 150–400)
RBC: 4.27 MIL/uL (ref 4.22–5.81)
RDW: 16.1 % — ABNORMAL HIGH (ref 11.5–15.5)
WBC: 17.4 10*3/uL — ABNORMAL HIGH (ref 4.0–10.5)
nRBC: 0 % (ref 0.0–0.2)

## 2021-08-03 LAB — MAGNESIUM: Magnesium: 2.5 mg/dL — ABNORMAL HIGH (ref 1.7–2.4)

## 2021-08-03 LAB — CBG MONITORING, ED: Glucose-Capillary: 153 mg/dL — ABNORMAL HIGH (ref 70–99)

## 2021-08-03 MED ORDER — LEVETIRACETAM 100 MG/ML PO SOLN
2000.0000 mg | Freq: Once | ORAL | Status: AC
  Administered 2021-08-03: 2000 mg
  Filled 2021-08-03: qty 20

## 2021-08-03 NOTE — ED Provider Notes (Signed)
[facility] EMERGENCY DEPARTMENT Provider Note   CSN: [id number] Arrival date & time: 08/03/21  2011     History  Chief Complaint  Patient presents with   Seizures    Austin Davis is a 81 y.o. male.  Patient with past medical history significant for chronic vent dependence, tremor, paroxysmal atrial fibrillation not on anticoagulation, presumed seizures on Keppra 1014m BID who presents from [facility] with 2 episodes of seizures over the course of the day. Patient arrives at this baseline, following simple commands, and jaw tremor. First seizure at noon, treated with ativan. Last seizure 7pm. Per RN report, no ativan at [facility] to treat seizure so sent to ED. Patient recently admitted in June for the same, seizure activity thought to be exacerbated by concurrent UTI.  No medication adjustments made at that time.       Home Medications Prior to Admission medications   Medication Sig Start Date End Date Taking? Authorizing Provider  acetaminophen (TYLENOL) 650 MG CR tablet 650 mg See admin instructions. 650 mg, per tube, every 8 hours as needed for moderate pain    [provider]  bethanechol (URECHOLINE) 10 MG tablet Take 1 tablet (10 mg total) by mouth 3 (three) times daily. 06/26/21   GMercy Riding MD  diazePAM (VALTOCO 5 MG DOSE) 5 MG/0.1ML LIQD Give 15 mg (0.354m intranasal for seizure lasting more than 5 minutes can repeat once after 4 hours.  Do not use more than twice in 24 hours. 06/26/21   GoMercy RidingMD  escitalopram (LEXAPRO) 10 MG tablet Place 10 mg into feeding tube daily.    [provider]  esomeprazole (NEXIUM) 40 MG packet 40 mg See admin instructions. 40 mg, per tube, every morning    [provider]  fexofenadine (ALLEGRA) 180 MG tablet Place 180 mg into feeding tube daily.    [provider]  fluticasone (FLONASE) 50 MCG/ACT nasal spray Place 2 sprays into both nostrils in the morning and at bedtime.     [provider]  folic acid (FOLVITE) 1 MG tablet Take 1 tablet (1 mg total) by mouth daily. Patient taking differently: Place 1 mg into feeding tube daily. 09/29/20   LaLittle IshikawaMD  furosemide (LASIX) 20 MG tablet Place 1 tablet (20 mg total) into feeding tube daily as needed for fluid or edema. 07/03/21   GoMercy RidingMD  GERI-TUSSIN 100 MG/5ML liquid Place 400 mg into feeding tube 3 (three) times daily.    [provider]  hydrOXYzine (ATARAX) 25 MG tablet Place 25 mg into feeding tube every 6 (six) hours as needed for anxiety.    [provider]  lactulose (CHRONULAC) 10 GM/15ML solution Place 20 g into feeding tube 2 (two) times daily as needed for mild constipation.    [provider]  levalbuterol (XPenne Lash1.25 MG/3ML nebulizer solution Take 1.25 mg by nebulization every 6 (six) hours.    [provider]  levETIRAcetam (KEPPRA) 100 MG/ML solution Place 1,000 mg into feeding tube in the morning and at bedtime.    [provider]  levothyroxine (SYNTHROID) 25 MCG tablet Place 25 mcg into feeding tube daily before breakfast.    [provider]  midodrine (PROAMATINE) 10 MG tablet Place 1 tablet (10 mg total) into feeding tube 3 (three) times daily with meals. 05/01/21   SiThurnell LoseMD  Multiple Vitamin (MULTIVITAMIN WITH MINERALS) TABS tablet Place 1 tablet into feeding tube daily. 05/11/21  Sheikh, Omair Latif, DO  Nutritional Supplements (FEEDING SUPPLEMENT, JEVITY 1.5 CAL/FIBER,) LIQD Place 1,000 mLs into feeding tube continuous. Patient taking differently: Place 55 mL/hr into feeding tube continuous. 05/10/21   Raiford Noble Latif, DO  Nutritional Supplements (FEEDING SUPPLEMENT, JEVITY 1.5 CAL/FIBER,) LIQD Place 55 mL/hr into feeding tube continuous.    [provider]  Omega-3 Fatty Acids (SUPER OMEGA 3 EPA/DHA) 1000 MG CAPS Give 1,000 mg by tube daily.    [provider]  ondansetron (ZOFRAN)  4 MG tablet Take 1 tablet (4 mg total) by mouth every 6 (six) hours as needed for nausea. Patient not taking: Reported on 06/04/2021 05/10/21   Raiford Noble Latif, DO  ondansetron (ZOFRAN-ODT) 4 MG disintegrating tablet 4 mg See admin instructions. 4 mg, per tube, every six hours as needed for nausea with vomiting    [provider]  polyethylene glycol powder (GLYCOLAX/MIRALAX) 17 GM/SCOOP powder Place 17 g into feeding tube See admin instructions. 17 grams, per tube, every 2 days    [provider]  Probiotic Product (CULTURELLE PROBIOTICS PO) Give 1 capsule by tube daily. 10 billion cell    [provider]  propranolol (INDERAL) 10 MG tablet Place 1 tablet (10 mg total) into feeding tube daily as needed (for tremors in AM. do not administer if heart rate is below 60 or systolic blood pressure is below 90.). Patient taking differently: Place 10 mg into feeding tube See admin instructions. 10 mg, per tube, in the morning as needed for tremors and do not administer if heart rate is less than 60 or Systolic B/P is less than 90 05/01/21   Thurnell Lose, MD  sodium chloride (OCEAN) 0.65 % SOLN nasal spray Place 1 spray into both nostrils every 4 (four) hours as needed (for allergic rhinitis).    [provider]  tamsulosin (FLOMAX) 0.4 MG CAPS capsule 0.4 mg See admin instructions. 0.4 mg per tube at bedtime    [provider]  vitamin C (ASCORBIC ACID) 500 MG tablet Place 500 mg into feeding tube daily.    [provider]  zinc gluconate 50 MG tablet Place 50 mg into feeding tube 2 (two) times daily.    [provider]      Allergies    Prednisone and Cortisone    Review of Systems   Review of Systems  Physical Exam Updated Vital Signs BP 127/72   Pulse 90   Temp 98.3 F (36.8 C) (Rectal)   Resp 20   SpO2 100%   Physical Exam Vitals and nursing note reviewed.  Constitutional:      Appearance: He is well-developed.  HENT:      Head: Normocephalic and atraumatic.     Nose: Nose normal.     Mouth/Throat:     Mouth: Mucous membranes are moist.  Eyes:     Conjunctiva/sclera: Conjunctivae normal.     Pupils: Pupils are equal, round, and reactive to light.  Cardiovascular:     Rate and Rhythm: Normal rate and regular rhythm.  Pulmonary:     Effort: No respiratory distress.  Abdominal:     General: Abdomen is flat.     Tenderness: There is no abdominal tenderness. There is no guarding or rebound.     Comments: G-tube in place  Musculoskeletal:     Cervical back: Normal range of motion and neck supple.  Skin:    General: Skin is warm and dry.  Neurological:     General: No  focal deficit present.     Mental Status: He is alert.     Comments: Jaw tremor, follows basic commands opens eyes, opens mouth.      ED Results / Procedures / Treatments   Labs (all labs ordered are listed, but only abnormal results are displayed) Labs Reviewed  CBC WITH DIFFERENTIAL/PLATELET - Abnormal; Notable for the following components:      Result Value   WBC 17.4 (*)    Hemoglobin 12.0 (*)    HCT 38.7 (*)    RDW 16.1 (*)    Neutro Abs 16.2 (*)    Lymphs Abs 0.4 (*)    Abs Immature Granulocytes 0.09 (*)    All other components within normal limits  COMPREHENSIVE METABOLIC PANEL - Abnormal; Notable for the following components:   Glucose, Bld 161 (*)    BUN 25 (*)    Total Protein 8.3 (*)    Albumin 3.4 (*)    ALT 45 (*)    Alkaline Phosphatase 174 (*)    All other components within normal limits  MAGNESIUM - Abnormal; Notable for the following components:   Magnesium 2.5 (*)    All other components within normal limits  CBG MONITORING, ED - Abnormal; Notable for the following components:   Glucose-Capillary 153 (*)    All other components within normal limits  I-STAT ARTERIAL BLOOD GAS, ED - Abnormal; Notable for the following components:   pO2, Arterial 52 (*)    HCT 36.0 (*)    Hemoglobin 12.2 (*)    All  other components within normal limits  URINALYSIS, ROUTINE W REFLEX MICROSCOPIC  BLOOD GAS, ARTERIAL    EKG EKG Interpretation  Date/Time:  Saturday August 03 2021 20:26:00 EDT Ventricular Rate:  90 PR Interval:  177 QRS Duration: 95 QT Interval:  353 QTC Calculation: 432 R Axis:   64 Text Interpretation: Sinus rhythm Baseline wander in lead(s) V5 when compared to prior, similar to prior No STEMI Confirmed by Antony Blackbird [phone]) on 08/03/2021 8:45:37 PM  Radiology No results found.  Procedures Procedures    Medications Ordered in ED Medications - No data to display  ED Course/ Medical Decision Making/ A&P    Patient seen and examined. History obtained directly from patient. Reviewed previous ED and neurology notes and recommendations.   Labs/EKG: Ordered CBC, CMP, Mg, UA.  Imaging: None ordered.  Medications/Fluids: None ordered.   Most recent vital signs reviewed and are as follows: BP 127/72   Pulse 90   Temp 98.3 F (36.8 C) (Rectal)   Resp 20   SpO2 100%   Initial impression: seizure  Patient discussed with Dr. Sherry Ruffing.   9:13 PM consulted with Dr. Leonel Ramsay of neurology by telephone and reviewed history and labs to this point.  Recommends 2000 mg Keppra per tube as patient did not receive evening dose.  Recommends increasing Keppra to 1500 mg twice daily assuming creatinine is stable.    11:44 PM Reassessment performed. Patient appears comfortable, sleeping, no changes. Has received dose of Keppra.   Labs personally reviewed and interpreted including: CBC with diff showing elevated WBC at 17.4, hemoglobin mildly low at 12; CMP with glucose 161, normal kidney function, alk phos 174, ALT 45; magnesium minimally elevated at 2.5; ABG with normal pH.  EKG with no changes.   Reviewed pertinent lab work and imaging with patient at bedside. Questions answered.   Most current vital signs reviewed and are as follows: BP (!) 147/82   Pulse 77  Temp 98.3 F  (36.8 C) (Rectal)   Resp (!) 23   SpO2 100%   Plan: Awaiting UA, d/c.   12:03 AM Sign out to Dr. Ralene Bathe at shift change, pending UA. In and out cath ordered.                           Medical Decision Making Amount and/or Complexity of Data Reviewed Labs: ordered.   Patient with 2 seizures today.  Back to baseline on ED arrival.  Awaiting remainder of work-up.  CBC with elevated white blood cell count, possibly related to seizure or potential for infection.  Patient does have a UTI history.  Vital signs are reassuring without fever or heart rate abnormality.  Discussed with neurology by telephone regarding medication recommendations.  Will adjust Keppra and have patient follow-up with neurologist.  Awaiting determination on whether or not he has recurrent UTI which could be lowering seizure threshold.  Low concern for CNS infection at this time.  No reported trauma.  Low concern for intracranial bleeding, no fever, or change in baseline mentation.        Final Clinical Impression(s) / ED Diagnoses Final diagnoses:  Seizure [facility])    Rx / [place] Orders ED Discharge Orders     None         Carlisle Cater, PA-C 08/04/21 0004    Tegeler, Gwenyth Allegra, MD 08/04/21 1506

## 2021-08-03 NOTE — Discharge Instructions (Signed)
Per neurology recommendations, please increase dose of Keppra from 1000mg  twice a day to 1500mg  twice a day.  Patient should be on meropenem 1 g every 8 hours for 7 days.  He has received a dose in the emergency department.

## 2021-08-03 NOTE — ED Triage Notes (Signed)
PT BIB [facility] from [facility] for seizures. First seizure  was at noon today and 1 mg ativan given. Second seizre at [facility], [facility] did not have any more ativan at their facility and sent him here. Pt back to  baseline on arrival.   VSS

## 2021-08-04 DIAGNOSIS — R64 Cachexia: Secondary | ICD-10-CM | POA: Diagnosis present

## 2021-08-04 DIAGNOSIS — J9503 Malfunction of tracheostomy stoma: Secondary | ICD-10-CM | POA: Diagnosis present

## 2021-08-04 DIAGNOSIS — G4089 Other seizures: Secondary | ICD-10-CM | POA: Diagnosis not present

## 2021-08-04 DIAGNOSIS — R569 Unspecified convulsions: Secondary | ICD-10-CM | POA: Diagnosis not present

## 2021-08-04 DIAGNOSIS — I5023 Acute on chronic systolic (congestive) heart failure: Secondary | ICD-10-CM | POA: Diagnosis not present

## 2021-08-04 DIAGNOSIS — R790 Abnormal level of blood mineral: Secondary | ICD-10-CM | POA: Diagnosis not present

## 2021-08-04 DIAGNOSIS — J189 Pneumonia, unspecified organism: Secondary | ICD-10-CM | POA: Diagnosis present

## 2021-08-04 DIAGNOSIS — G894 Chronic pain syndrome: Secondary | ICD-10-CM | POA: Diagnosis not present

## 2021-08-04 DIAGNOSIS — Z79899 Other long term (current) drug therapy: Secondary | ICD-10-CM | POA: Diagnosis not present

## 2021-08-04 DIAGNOSIS — R0989 Other specified symptoms and signs involving the circulatory and respiratory systems: Secondary | ICD-10-CM | POA: Diagnosis not present

## 2021-08-04 DIAGNOSIS — D638 Anemia in other chronic diseases classified elsewhere: Secondary | ICD-10-CM | POA: Diagnosis present

## 2021-08-04 DIAGNOSIS — G40909 Epilepsy, unspecified, not intractable, without status epilepticus: Secondary | ICD-10-CM | POA: Diagnosis present

## 2021-08-04 DIAGNOSIS — F32A Depression, unspecified: Secondary | ICD-10-CM | POA: Diagnosis present

## 2021-08-04 DIAGNOSIS — Z431 Encounter for attention to gastrostomy: Secondary | ICD-10-CM | POA: Diagnosis not present

## 2021-08-04 DIAGNOSIS — Z931 Gastrostomy status: Secondary | ICD-10-CM | POA: Diagnosis not present

## 2021-08-04 DIAGNOSIS — Z952 Presence of prosthetic heart valve: Secondary | ICD-10-CM | POA: Diagnosis not present

## 2021-08-04 DIAGNOSIS — Y95 Nosocomial condition: Secondary | ICD-10-CM | POA: Diagnosis present

## 2021-08-04 DIAGNOSIS — I5042 Chronic combined systolic (congestive) and diastolic (congestive) heart failure: Secondary | ICD-10-CM | POA: Diagnosis present

## 2021-08-04 DIAGNOSIS — I82402 Acute embolism and thrombosis of unspecified deep veins of left lower extremity: Secondary | ICD-10-CM | POA: Diagnosis not present

## 2021-08-04 DIAGNOSIS — I739 Peripheral vascular disease, unspecified: Secondary | ICD-10-CM | POA: Diagnosis not present

## 2021-08-04 DIAGNOSIS — K219 Gastro-esophageal reflux disease without esophagitis: Secondary | ICD-10-CM | POA: Diagnosis not present

## 2021-08-04 DIAGNOSIS — I480 Paroxysmal atrial fibrillation: Secondary | ICD-10-CM | POA: Diagnosis present

## 2021-08-04 DIAGNOSIS — J9622 Acute and chronic respiratory failure with hypercapnia: Secondary | ICD-10-CM | POA: Diagnosis present

## 2021-08-04 DIAGNOSIS — J9611 Chronic respiratory failure with hypoxia: Secondary | ICD-10-CM | POA: Diagnosis not present

## 2021-08-04 DIAGNOSIS — E43 Unspecified severe protein-calorie malnutrition: Secondary | ICD-10-CM | POA: Diagnosis present

## 2021-08-04 DIAGNOSIS — Z681 Body mass index (BMI) 19 or less, adult: Secondary | ICD-10-CM | POA: Diagnosis not present

## 2021-08-04 DIAGNOSIS — J440 Chronic obstructive pulmonary disease with acute lower respiratory infection: Secondary | ICD-10-CM | POA: Diagnosis present

## 2021-08-04 DIAGNOSIS — I959 Hypotension, unspecified: Secondary | ICD-10-CM | POA: Diagnosis not present

## 2021-08-04 DIAGNOSIS — Z430 Encounter for attention to tracheostomy: Secondary | ICD-10-CM | POA: Diagnosis not present

## 2021-08-04 DIAGNOSIS — Z23 Encounter for immunization: Secondary | ICD-10-CM | POA: Diagnosis not present

## 2021-08-04 DIAGNOSIS — D72829 Elevated white blood cell count, unspecified: Secondary | ICD-10-CM | POA: Diagnosis not present

## 2021-08-04 DIAGNOSIS — J449 Chronic obstructive pulmonary disease, unspecified: Secondary | ICD-10-CM | POA: Diagnosis not present

## 2021-08-04 DIAGNOSIS — Z9911 Dependence on respirator [ventilator] status: Secondary | ICD-10-CM | POA: Diagnosis not present

## 2021-08-04 DIAGNOSIS — M6281 Muscle weakness (generalized): Secondary | ICD-10-CM | POA: Diagnosis not present

## 2021-08-04 DIAGNOSIS — E46 Unspecified protein-calorie malnutrition: Secondary | ICD-10-CM | POA: Diagnosis present

## 2021-08-04 DIAGNOSIS — Z95828 Presence of other vascular implants and grafts: Secondary | ICD-10-CM | POA: Diagnosis not present

## 2021-08-04 DIAGNOSIS — E039 Hypothyroidism, unspecified: Secondary | ICD-10-CM | POA: Diagnosis present

## 2021-08-04 DIAGNOSIS — R488 Other symbolic dysfunctions: Secondary | ICD-10-CM | POA: Diagnosis not present

## 2021-08-04 DIAGNOSIS — G319 Degenerative disease of nervous system, unspecified: Secondary | ICD-10-CM | POA: Diagnosis not present

## 2021-08-04 DIAGNOSIS — R627 Adult failure to thrive: Secondary | ICD-10-CM | POA: Diagnosis present

## 2021-08-04 DIAGNOSIS — Z4659 Encounter for fitting and adjustment of other gastrointestinal appliance and device: Secondary | ICD-10-CM | POA: Diagnosis not present

## 2021-08-04 DIAGNOSIS — N390 Urinary tract infection, site not specified: Secondary | ICD-10-CM | POA: Diagnosis not present

## 2021-08-04 DIAGNOSIS — J9620 Acute and chronic respiratory failure, unspecified whether with hypoxia or hypercapnia: Secondary | ICD-10-CM | POA: Diagnosis not present

## 2021-08-04 DIAGNOSIS — G4733 Obstructive sleep apnea (adult) (pediatric): Secondary | ICD-10-CM | POA: Diagnosis not present

## 2021-08-04 DIAGNOSIS — Z1624 Resistance to multiple antibiotics: Secondary | ICD-10-CM | POA: Diagnosis present

## 2021-08-04 DIAGNOSIS — I9589 Other hypotension: Secondary | ICD-10-CM | POA: Diagnosis present

## 2021-08-04 DIAGNOSIS — R1310 Dysphagia, unspecified: Secondary | ICD-10-CM | POA: Diagnosis not present

## 2021-08-04 DIAGNOSIS — J9621 Acute and chronic respiratory failure with hypoxia: Secondary | ICD-10-CM | POA: Diagnosis present

## 2021-08-04 LAB — URINALYSIS, ROUTINE W REFLEX MICROSCOPIC
Bilirubin Urine: NEGATIVE
Glucose, UA: NEGATIVE mg/dL
Hgb urine dipstick: NEGATIVE
Ketones, ur: NEGATIVE mg/dL
Nitrite: NEGATIVE
Protein, ur: 100 mg/dL — AB
Specific Gravity, Urine: 1.017 (ref 1.005–1.030)
WBC, UA: >50 WBC/hpf — ABNORMAL HIGH (ref 0–5)
pH: 5 (ref 5.0–8.0)

## 2021-08-04 LAB — LACTIC ACID, PLASMA
Lactic Acid, Venous: 1.3 mmol/L (ref 0.5–1.9)
Lactic Acid, Venous: 1.2 mmol/L (ref 0.5–1.9)

## 2021-08-04 MED ORDER — MIDODRINE HCL 5 MG PO TABS
10.0000 mg | ORAL_TABLET | Freq: Three times a day (TID) | ORAL | Status: DC
  Administered 2021-08-04: 10 mg
  Filled 2021-08-04: qty 2

## 2021-08-04 MED ORDER — SODIUM CHLORIDE 0.9 % IV BOLUS
500.0000 mL | Freq: Once | INTRAVENOUS | Status: AC
Start: 2021-08-04 — End: 2021-08-04
  Administered 2021-08-04: 500 mL via INTRAVENOUS

## 2021-08-04 MED ORDER — SODIUM CHLORIDE 0.9 % IV SOLN
1.0000 g | Freq: Once | INTRAVENOUS | Status: AC
  Administered 2021-08-04: 1 g via INTRAVENOUS
  Filled 2021-08-04: qty 10

## 2021-08-04 MED ORDER — LEVETIRACETAM 100 MG/ML PO SOLN
1500.0000 mg | Freq: Two times a day (BID) | ORAL | Status: DC
Start: 2021-08-04 — End: 2021-08-04
  Administered 2021-08-04: 1500 mg
  Filled 2021-08-04: qty 15

## 2021-08-04 MED ORDER — SODIUM CHLORIDE 0.9 % IV SOLN: 1.0000 g | INTRAVENOUS | Status: DC

## 2021-08-04 MED ORDER — SODIUM CHLORIDE 0.9 % IV SOLN
1.0000 g | Freq: Three times a day (TID) | INTRAVENOUS | Status: DC
  Administered 2021-08-04: 1 g via INTRAVENOUS
  Filled 2021-08-04 (×2): qty 20

## 2021-08-04 MED ORDER — LEVOTHYROXINE SODIUM 25 MCG PO TABS
25.0000 ug | ORAL_TABLET | Freq: Every day | ORAL | Status: DC
  Administered 2021-08-04: 25 ug
  Filled 2021-08-04: qty 1

## 2021-08-04 NOTE — ED Notes (Signed)
[facility] at bedside 

## 2021-08-04 NOTE — ED Provider Notes (Signed)
  Physical Exam  BP 102/63   Pulse 62   Temp 98.3 F (36.8 C) (Rectal)   Resp 20   SpO2 100%   Physical Exam  Procedures  Procedures  ED Course / MDM   Clinical Course as of 08/04/21 0847  Sun Aug 04, 2021  0710 Mirem 1 g q8 x 7 [DR]    Clinical Course User Index [DR] Pattricia Boss, MD   Medical Decision Making Amount and/or Complexity of Data Reviewed Labs: ordered.  Risk Prescription drug management.   81 year old from skilled nursing facility presents with seizure with known history of seizure disorder.  Previous visit was seizure that was thought to have been provoked by UTI.  Patient with history of drug-resistant UTI.  Per Dr. Zenia Resides note, patient needs to be on meropenem every 8 hours.  He was given 1 dose here at approximately 4 AM.  Transitions of care has seen and discussed with nursing home and he will be able to get his meropenem there today but likely not until this evening.  He has had a second dose ordered here prior to transfer back to skilled nursing facility.      Pattricia Boss, MD 08/05/21 [phone]

## 2021-08-04 NOTE — ED Provider Notes (Signed)
Patient care assumed at midnight.  Patient with history of vent dependence at [facility] skilled nursing facility, paroxysmal atrial fibrillation, seizures, multidrug-resistant UTI here for evaluation of 2 episodes of seizures over the last day.  Seizures terminated with 1 dose of Ativan.  Neurology recommends increasing Keppra to 1500 mg twice daily.  Care assumed pending urinalysis.  UA is concerning for UTI.  Patient does not have an indwelling catheter.  Per nursing urine appeared very cloudy, dark.  On record review patient with history of multidrug-resistant UTI.  Discussed with pharmacist.  Only medication that can treat this is meropenem.  Will give dose at this time.  Patient has intermittent transient borderline blood pressures, these quickly resolved.  He is on midodrine for history of hypotension.  Patient is awake and alert.  He is well perfused on examination.  CBC does have leukocytosis, but this is a frequent finding and seizures.  We will send a lactic acid to see if this is elevated for any signs of sepsis.  Lactic acid is wnl.  Discussed the patient's studies and plan with his daughter over the phone.  Blood pressures appear to be stable.  No recurrent seizure activity.  Case management consulted regarding availability of meropenem IV at [facility].  Plan is to discharge back to his facility on meropenem 1 g IV every 8 for total of 7 days for UTI and continue increased Keppra 1500 mg twice daily.  Patient care transferred pending case management assistance.   Quintella Reichert, MD 08/04/21 [phone]

## 2021-08-04 NOTE — ED Notes (Signed)
Three doses of Meropenem IV were sent with [facility] to bring to [facility]. Report called to Tarri Glenn RN.

## 2021-08-04 NOTE — Care Management (Addendum)
Called [facility], need medication on DC summary. They will not be able to obtain it until this evening.  Fax DC summary with medications to [phone] [facility] [phone]  to call [facility] when patient is coming. Durene Cal latest physician note to [facility] for processing medication. They state that the IV merrepenum will likely not arrive until night shift Discussed with Dr ray, she is ordering a dose now and then he can return.RN aware.via Physicist, medical at [facility] to update, however received a update that there are no Sunday runs and they will not likely receive the medication until 2pm Monday. .  Will call pharmacy to see what we can work out to get the patient back to [facility]. 0920 called pharmacy spoke with kimberly. And Janett Billow. They will send [place] 3 doses of medication. Coffined with [facility] that it could be sent with the patient. 1204 patient arrived to [facility] but the nurse victoria stated that there were no instructions for medication. Faxed over AVS to [phone]

## 2021-08-04 NOTE — ED Notes (Signed)
EDP notified of the pt's BP, RN instructed to give 8am Midodrine now with a 500cc NS bolus

## 2021-08-04 NOTE — ED Notes (Signed)
[facility] called at 10:40; no record of request earlier, per CL,

## 2021-08-05 LAB — URINE CULTURE

## 2021-08-08 DIAGNOSIS — J9620 Acute and chronic respiratory failure, unspecified whether with hypoxia or hypercapnia: Secondary | ICD-10-CM | POA: Diagnosis not present

## 2021-08-09 LAB — CULTURE, BLOOD (ROUTINE X 2)
Culture: NO GROWTH
Culture: NO GROWTH

## 2021-08-12 DIAGNOSIS — G894 Chronic pain syndrome: Secondary | ICD-10-CM | POA: Diagnosis not present

## 2021-08-12 DIAGNOSIS — Z430 Encounter for attention to tracheostomy: Secondary | ICD-10-CM | POA: Diagnosis not present

## 2021-08-12 DIAGNOSIS — E039 Hypothyroidism, unspecified: Secondary | ICD-10-CM | POA: Diagnosis not present

## 2021-08-12 DIAGNOSIS — K219 Gastro-esophageal reflux disease without esophagitis: Secondary | ICD-10-CM | POA: Diagnosis not present

## 2021-08-12 DIAGNOSIS — J9620 Acute and chronic respiratory failure, unspecified whether with hypoxia or hypercapnia: Secondary | ICD-10-CM | POA: Diagnosis not present

## 2021-08-12 DIAGNOSIS — I480 Paroxysmal atrial fibrillation: Secondary | ICD-10-CM | POA: Diagnosis not present

## 2021-08-12 DIAGNOSIS — G4089 Other seizures: Secondary | ICD-10-CM | POA: Diagnosis not present

## 2021-08-12 DIAGNOSIS — I739 Peripheral vascular disease, unspecified: Secondary | ICD-10-CM | POA: Diagnosis not present

## 2021-08-12 DIAGNOSIS — R1310 Dysphagia, unspecified: Secondary | ICD-10-CM | POA: Diagnosis not present

## 2021-08-12 DIAGNOSIS — I82402 Acute embolism and thrombosis of unspecified deep veins of left lower extremity: Secondary | ICD-10-CM | POA: Diagnosis not present

## 2021-08-12 DIAGNOSIS — I5023 Acute on chronic systolic (congestive) heart failure: Secondary | ICD-10-CM | POA: Diagnosis not present

## 2021-08-12 DIAGNOSIS — Z431 Encounter for attention to gastrostomy: Secondary | ICD-10-CM | POA: Diagnosis not present

## 2021-08-12 DIAGNOSIS — G319 Degenerative disease of nervous system, unspecified: Secondary | ICD-10-CM | POA: Diagnosis not present

## 2021-08-12 DIAGNOSIS — J449 Chronic obstructive pulmonary disease, unspecified: Secondary | ICD-10-CM | POA: Diagnosis not present

## 2021-08-12 DIAGNOSIS — I959 Hypotension, unspecified: Secondary | ICD-10-CM | POA: Diagnosis not present

## 2021-08-12 DIAGNOSIS — J9621 Acute and chronic respiratory failure with hypoxia: Secondary | ICD-10-CM | POA: Diagnosis not present

## 2021-08-12 DIAGNOSIS — G4733 Obstructive sleep apnea (adult) (pediatric): Secondary | ICD-10-CM | POA: Diagnosis not present

## 2021-08-12 DIAGNOSIS — Z9911 Dependence on respirator [ventilator] status: Secondary | ICD-10-CM | POA: Diagnosis not present

## 2021-08-12 DIAGNOSIS — D638 Anemia in other chronic diseases classified elsewhere: Secondary | ICD-10-CM | POA: Diagnosis not present

## 2021-08-30 ENCOUNTER — Other Ambulatory Visit: Payer: Self-pay

## 2021-08-30 ENCOUNTER — Encounter (HOSPITAL_COMMUNITY): Payer: Self-pay | Admitting: *Deleted

## 2021-08-30 ENCOUNTER — Emergency Department (HOSPITAL_COMMUNITY)
Admission: EM | Admit: 2021-08-30 | Discharge: 2021-08-30 | Disposition: A | Discharge status: Skilled Nursing Facility | Payer: Medicare Other | Attending: Emergency Medicine | Admitting: Emergency Medicine

## 2021-08-30 DIAGNOSIS — I959 Hypotension, unspecified: Secondary | ICD-10-CM | POA: Insufficient documentation

## 2021-08-30 DIAGNOSIS — Z9911 Dependence on respirator [ventilator] status: Secondary | ICD-10-CM | POA: Diagnosis not present

## 2021-08-30 DIAGNOSIS — R569 Unspecified convulsions: Secondary | ICD-10-CM | POA: Insufficient documentation

## 2021-08-30 DIAGNOSIS — J9620 Acute and chronic respiratory failure, unspecified whether with hypoxia or hypercapnia: Secondary | ICD-10-CM | POA: Diagnosis not present

## 2021-08-30 DIAGNOSIS — Z79899 Other long term (current) drug therapy: Secondary | ICD-10-CM | POA: Insufficient documentation

## 2021-08-30 DIAGNOSIS — G40909 Epilepsy, unspecified, not intractable, without status epilepticus: Secondary | ICD-10-CM | POA: Diagnosis not present

## 2021-08-30 LAB — CBC WITH DIFFERENTIAL/PLATELET
Abs Immature Granulocytes: 0.07 10*3/uL (ref 0.00–0.07)
Basophils Absolute: 0 10*3/uL (ref 0.0–0.1)
Basophils Relative: 0 %
Eosinophils Absolute: 0.1 10*3/uL (ref 0.0–0.5)
Eosinophils Relative: 1 %
HCT: 32.6 % — ABNORMAL LOW (ref 39.0–52.0)
Hemoglobin: 10.4 g/dL — ABNORMAL LOW (ref 13.0–17.0)
Immature Granulocytes: 1 %
Lymphocytes Relative: 6 %
Lymphs Abs: 0.8 10*3/uL (ref 0.7–4.0)
MCH: 27.3 pg (ref 26.0–34.0)
MCHC: 31.9 g/dL (ref 30.0–36.0)
MCV: 85.6 fL (ref 80.0–100.0)
Monocytes Absolute: 0.8 10*3/uL (ref 0.1–1.0)
Monocytes Relative: 6 %
Neutro Abs: 11.2 10*3/uL — ABNORMAL HIGH (ref 1.7–7.7)
Neutrophils Relative %: 86 %
Platelets: 360 10*3/uL (ref 150–400)
RBC: 3.81 MIL/uL — ABNORMAL LOW (ref 4.22–5.81)
RDW: 16.4 % — ABNORMAL HIGH (ref 11.5–15.5)
WBC: 12.9 10*3/uL — ABNORMAL HIGH (ref 4.0–10.5)
nRBC: 0 % (ref 0.0–0.2)

## 2021-08-30 LAB — BASIC METABOLIC PANEL
Anion gap: 7 (ref 5–15)
BUN: 27 mg/dL — ABNORMAL HIGH (ref 8–23)
CO2: 30 mmol/L (ref 22–32)
Calcium: 9.2 mg/dL (ref 8.9–10.3)
Chloride: 97 mmol/L — ABNORMAL LOW (ref 98–111)
Creatinine, Ser: 0.51 mg/dL — ABNORMAL LOW (ref 0.61–1.24)
GFR, Estimated: >60 mL/min (ref >60)
Glucose, Bld: 114 mg/dL — ABNORMAL HIGH (ref 70–99)
Potassium: 4.4 mmol/L (ref 3.5–5.1)
Sodium: 134 mmol/L — ABNORMAL LOW (ref 135–145)

## 2021-08-30 MED ORDER — SODIUM CHLORIDE 0.9 % IV BOLUS
250.0000 mL | Freq: Once | INTRAVENOUS | Status: AC
  Administered 2021-08-30: 250 mL via INTRAVENOUS

## 2021-08-30 MED ORDER — MIDODRINE HCL 5 MG PO TABS
10.0000 mg | ORAL_TABLET | Freq: Once | ORAL | Status: AC
  Administered 2021-08-30: 10 mg via ORAL
  Filled 2021-08-30: qty 2

## 2021-08-30 MED ORDER — SODIUM CHLORIDE 0.9 % IV BOLUS: 500.0000 mL | Freq: Once | INTRAVENOUS | Status: DC

## 2021-08-30 NOTE — ED Provider Notes (Signed)
[facility] EMERGENCY DEPARTMENT Provider Note   CSN: [id number] Arrival date & time: 08/30/21  1210     History  Chief Complaint  Patient presents with   Seizures    Austin Davis is a 81 y.o. male.  Patient with past medical history significant for chronic vent dependence, tremor, paroxysmal atrial fibrillation not on anticoagulation, presumed seizures on now Keppra 1500mg  BID who presents after having a seizure while at [facility] today.  Patient was given 2 mg of IV Ativan and then an additional dose of 2 mg IV Ativan prior to EMS transport.  Patient has returned to baseline.  Seizure activity reported as upper body tremor, lasted about 15 minutes.  He seems to have returned to his neurologic baseline but is sleepy.  No reported fevers, vomiting, diarrhea.        Home Medications Prior to Admission medications   Medication Sig Start Date End Date Taking? Authorizing Provider  acetaminophen (TYLENOL) 650 MG CR tablet 650 mg See admin instructions. 650 mg, per tube, every 8 hours as needed for moderate pain    [provider]  bethanechol (URECHOLINE) 10 MG tablet Take 1 tablet (10 mg total) by mouth 3 (three) times daily. 06/26/21   Mercy Riding, MD  diazePAM (VALTOCO 5 MG DOSE) 5 MG/0.1ML LIQD Give 15 mg (0.50ml) intranasal for seizure lasting more than 5 minutes can repeat once after 4 hours.  Do not use more than twice in 24 hours. 06/26/21   Mercy Riding, MD  escitalopram (LEXAPRO) 10 MG tablet Place 10 mg into feeding tube daily.    [provider]  fexofenadine (ALLEGRA) 180 MG tablet Place 180 mg into feeding tube daily.    [provider]  fluticasone (FLONASE) 50 MCG/ACT nasal spray Place 2 sprays into both nostrils in the morning and at bedtime.    [provider]  folic acid (FOLVITE) 1 MG tablet Take 1 tablet (1 mg total) by mouth daily. Patient taking differently: Place 1 mg into feeding tube daily. 09/29/20    [name], MD  furosemide (LASIX) 20 MG tablet Place 1 tablet (20 mg total) into feeding tube daily as needed for fluid or edema. 07/03/21   Mercy Riding, MD  GERI-TUSSIN 100 MG/5ML liquid Place 400 mg into feeding tube 3 (three) times daily.    [provider]  hydrOXYzine (ATARAX) 25 MG tablet Place 25 mg into feeding tube every 6 (six) hours as needed for anxiety.    [provider]  lactulose (CHRONULAC) 10 GM/15ML solution Place 20 g into feeding tube 2 (two) times daily as needed for mild constipation.    [provider]  levalbuterol Penne Lash) 1.25 MG/3ML nebulizer solution Take 1.25 mg by nebulization every 6 (six) hours.    [provider]  levETIRAcetam (KEPPRA) 100 MG/ML solution Place 1,000 mg into feeding tube in the morning and at bedtime.    [provider]  levothyroxine (SYNTHROID) 25 MCG tablet Place 25 mcg into feeding tube daily before breakfast.    [provider]  midodrine (PROAMATINE) 10 MG tablet Place 1 tablet (10 mg total) into feeding tube 3 (three) times daily with meals. 05/01/21   Thurnell Lose, MD  Multiple Vitamin (MULTIVITAMIN WITH MINERALS) TABS tablet Place 1 tablet into feeding tube daily. 05/11/21   Raiford Noble Latif, DO  Nutritional Supplements (FEEDING SUPPLEMENT, JEVITY 1.5 CAL/FIBER,) LIQD Place 1,000 mLs into feeding tube continuous. Patient taking differently: Place  55 mL/hr into feeding tube continuous. 05/10/21   Raiford Noble Latif, DO  Nutritional Supplements (FEEDING SUPPLEMENT, JEVITY 1.5 CAL/FIBER,) LIQD Place 55 mL/hr into feeding tube continuous.    [provider]  Omega-3 Fatty Acids (SUPER OMEGA 3 EPA/DHA) 1000 MG CAPS Give 1,000 mg by tube daily.    [provider]  ondansetron (ZOFRAN) 4 MG tablet Take 1 tablet (4 mg total) by mouth every 6 (six) hours as needed for nausea. Patient not taking: Reported on 06/04/2021 05/10/21   Raiford Noble Latif, DO   ondansetron (ZOFRAN-ODT) 4 MG disintegrating tablet 4 mg See admin instructions. 4 mg, per tube, every six hours as needed for nausea with vomiting    [provider]  polyethylene glycol powder (GLYCOLAX/MIRALAX) 17 GM/SCOOP powder Place 17 g into feeding tube See admin instructions. 17 grams, per tube, every 2 days    [provider]  Probiotic Product (CULTURELLE PROBIOTICS PO) Give 1 capsule by tube daily. 10 billion cell    [provider]  propranolol (INDERAL) 10 MG tablet Place 1 tablet (10 mg total) into feeding tube daily as needed (for tremors in AM. do not administer if heart rate is below 60 or systolic blood pressure is below 90.). Patient taking differently: Place 10 mg into feeding tube See admin instructions. 10 mg, per tube, in the morning as needed for tremors and do not administer if heart rate is less than 60 or Systolic B/P is less than 90 05/01/21   Thurnell Lose, MD  sodium chloride (OCEAN) 0.65 % SOLN nasal spray Place 1 spray into both nostrils every 4 (four) hours as needed (for allergic rhinitis).    [provider]  tamsulosin (FLOMAX) 0.4 MG CAPS capsule 0.4 mg See admin instructions. 0.4 mg per tube at bedtime    [provider]  vitamin C (ASCORBIC ACID) 500 MG tablet Place 500 mg into feeding tube daily.    [provider]  zinc gluconate 50 MG tablet Place 50 mg into feeding tube 2 (two) times daily.    [provider]      Allergies    Prednisone and Cortisone    Review of Systems   Review of Systems  Physical Exam Updated Vital Signs BP 93/65 (BP Location: Left Arm)   Pulse 97   Resp 16   Ht 5\' 10"  (1.778 m)   Wt 55 kg   SpO2 100%   BMI 17.40 kg/m   Physical Exam Vitals and nursing note reviewed.  Constitutional:      General: He is not in acute distress.    Appearance: He is well-developed.  HENT:     Head: Normocephalic and atraumatic.     Right Ear: External ear normal.      Left Ear: External ear normal.  Eyes:     General:        Right eye: No discharge.        Left eye: No discharge.     Conjunctiva/sclera: Conjunctivae normal.  Neck:     Comments: Tracheostomy in place. Cardiovascular:     Rate and Rhythm: Normal rate and regular rhythm.     Heart sounds: Normal heart sounds.  Pulmonary:     Effort: Pulmonary effort is normal.     Breath sounds: Normal breath sounds.     Comments: Lung sounds clear to auscultation bilaterally. Abdominal:     Palpations: Abdomen is soft.     Tenderness: There is no abdominal tenderness.  There is no guarding or rebound.  Musculoskeletal:     Cervical back: Normal range of motion and neck supple.  Skin:    General: Skin is warm and dry.  Neurological:     Mental Status: He is alert.     Comments: Patient arousable to voice.  Does seem somewhat sleepy after receiving Ativan.     ED Results / Procedures / Treatments   Labs (all labs ordered are listed, but only abnormal results are displayed) Labs Reviewed  CBC WITH DIFFERENTIAL/PLATELET  BASIC METABOLIC PANEL    EKG None  Radiology No results found.  Procedures Procedures    Medications Ordered in ED Medications - No data to display  ED Course/ Medical Decision Making/ A&P Clinical Course as of 08/30/21 1541  Fri Aug 30, 2021  1529 FU on labs, Lurline Idol dep, seizure disorder. Likely dc home  [BH]    Clinical Course User Index [BH] Henderly, Britni A, PA-C   Patient seen and examined. History obtained directly from EMS report, nursing home information, and previous ED evaluation.   Labs/EKG: CBC, BMP pending.   Imaging: None ordered.  Medications/Fluids: None ordered.   Most recent vital signs reviewed and are as follows: BP (!) 151/95   Pulse 67   Resp 16   Ht 5\' 10"  (1.778 m)   Wt 55 kg   SpO2 100%   BMI 17.40 kg/m   Initial impression: Recurrent seizure.   3:42 PM Reassessment performed. Patient appears stable.  Patient  discussed with and seen by Dr. Sherry Ruffing.  Labs ordered and pending: CBC, BMP.  Most current vital signs reviewed and are as follows: BP (!) 133/96   Pulse 80   Resp 19   Ht 5\' 10"  (1.778 m)   Wt 55 kg   SpO2 100%   BMI 17.40 kg/m   Plan: Continue to monitor, will check labs to make sure no significant problems today.  If patient is stable without any problems, consider discharged with current plan.  3:47 PM Signout to Henderly PA-C at shift change.                           Medical Decision Making  Patient with known seizure history with seizure today requiring 2 doses of IM Ativan.  He has returned to his baseline.  No obvious signs of sepsis today.  Patient being monitored until complete return to baseline after Ativan.  Keppra was increased at last visit.  No signs of head injury or CVA today.           Final Clinical Impression(s) / ED Diagnoses Final diagnoses:  Seizure [facility])    Rx / DC Orders ED Discharge Orders     None         Carlisle Cater, PA-C 08/30/21 1548    Tegeler, Gwenyth Allegra, MD 08/31/21 [phone]

## 2021-08-30 NOTE — ED Notes (Signed)
[facility] called. 

## 2021-08-30 NOTE — ED Notes (Signed)
Report given to [facility] for transport. Confirmed with Miranda at [facility] to leave in the #22 g IV in the right wrist for discharge back to the facility.

## 2021-08-30 NOTE — ED Triage Notes (Signed)
Patient presents to ed via [facility] from [facility], per [facility] patient is  trach vent dependant  had seizure was given total of 4 mg Ativan IM seizure  lasting 15 minutes upon arrival to ed patient is alert and pretty much back to baseline per [facility]. , not seizing at present.

## 2021-08-30 NOTE — ED Provider Notes (Cosign Needed Addendum)
Care assumed from previous provider at shift change.  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  Received Ativan with EMS.  Resolved and at baseline mentation on arrival.  Plan to check basic labs if no significant findings DC back to facility Physical Exam  BP 107/81   Pulse 88   Temp 98.6 F (37 C) (Temporal)   Resp 19   Ht 5\' 10"  (1.778 m)   Wt 55 kg   SpO2 97%   BMI 17.40 kg/m   Physical Exam Vitals and nursing note reviewed.  Constitutional:      General: He is not in acute distress.    Appearance: He is well-developed. He is not ill-appearing or diaphoretic.  HENT:     Head: Atraumatic.  Eyes:     Pupils: Pupils are equal, round, and reactive to light.  Neck:     Comments: Trach collar present Cardiovascular:     Rate and Rhythm: Normal rate and regular rhythm.  Pulmonary:     Effort: Pulmonary effort is normal. No respiratory distress.  Abdominal:     General: There is no distension.     Palpations: Abdomen is soft.     Comments: G-tube  Musculoskeletal:        General: Normal range of motion.     Cervical back: Normal range of motion and neck supple.  Skin:    General: Skin is warm and dry.  Neurological:     General: No focal deficit present.     Mental Status: He is alert and oriented to person, place, and time.    Procedures  Procedures Labs Reviewed  CBC WITH DIFFERENTIAL/PLATELET - Abnormal; Notable for the following components:      Result Value   WBC 12.9 (*)    RBC 3.81 (*)    Hemoglobin 10.4 (*)    HCT 32.6 (*)    RDW 16.4 (*)    Neutro Abs 11.2 (*)    All other components within normal limits  BASIC METABOLIC PANEL - Abnormal; Notable for the following components:   Sodium 134 (*)    Chloride 97 (*)    Glucose, Bld 114 (*)    BUN 27 (*)    Creatinine, Ser 0.51 (*)    All other components within normal limits   No  results found.  ED Course / MDM   Clinical Course as of 08/30/21 2213  Fri Aug 30, 2021  1529 FU on labs, Lurline Idol dep, seizure disorder. Likely dc home  [BH]  1932 Low BP, med list patient has missed last few doses of midodrine.  Will give dose here make sure improved [BH]  2213 Last BP x 3 at baseline 107/81 [BH]    Clinical Course User Index [BH] Britani Beattie A, PA-C   Medical Decision Making Amount and/or Complexity of Data Reviewed External Data Reviewed: labs, radiology and notes. Labs: ordered. Decision-making details documented in ED Course.  Risk OTC drugs. Prescription drug management. Parenteral controlled substances. Decision regarding hospitalization. Diagnosis or treatment significantly limited by social determinants of health.   Labs personally viewed and interpreted   Has had some transient low blood pressures.  Initially given small fluid bolus.  I reviewed his MAR.  It appears he supposed be getting midodrine 3 times daily.  Did not get last 2 doses.  He has evidence of sepsis at this time.  Appears well  perfused.  At baseline mentation.  Will give dose of midodrine and reassess  Patient blood pressures improved.  It appears his baseline and prior ED and admissions soft BP at baseline.  I feels earlier hypotension likely due to lack of his home midodrine. I have low suspicion for sepsis.  He has had no further seizure-like activity.  Discussed with attending, Dr. Tomi Bamberger. Will dc patient back to [facility].  The patient has been appropriately medically screened and/or stabilized in the ED. I have low suspicion for any other emergent medical condition which would require further screening, evaluation or treatment in the ED or require inpatient management.  Patient is hemodynamically stable and in no acute distress.  Patient able to ambulate in department prior to ED.  Evaluation does not show acute pathology that would require ongoing or additional emergent  interventions while in the emergency department or further inpatient treatment.  I have discussed the diagnosis with the patient and answered all questions.  Pain is been managed while in the emergency department and patient has no further complaints prior to discharge.  Patient is comfortable with plan discussed in room and is stable for discharge at this time.  I have discussed strict return precautions for returning to the emergency department.  Patient was encouraged to follow-up with PCP/specialist refer to at discharge.         Gradie Butrick A, PA-C 08/30/21 2213    Dorie Rank, MD 08/31/21 [phone]

## 2021-08-30 NOTE — Progress Notes (Signed)
Patient arrived from [facility] via [facility]. A #6 Distal XLT Shiley in place. Pt placed on previous [facility] settings:  PC24/12/+5/40%.

## 2021-09-03 DIAGNOSIS — I82402 Acute embolism and thrombosis of unspecified deep veins of left lower extremity: Secondary | ICD-10-CM | POA: Diagnosis not present

## 2021-09-03 DIAGNOSIS — Z4659 Encounter for fitting and adjustment of other gastrointestinal appliance and device: Secondary | ICD-10-CM | POA: Diagnosis not present

## 2021-09-03 DIAGNOSIS — I480 Paroxysmal atrial fibrillation: Secondary | ICD-10-CM | POA: Diagnosis not present

## 2021-09-03 DIAGNOSIS — I739 Peripheral vascular disease, unspecified: Secondary | ICD-10-CM | POA: Diagnosis not present

## 2021-09-03 DIAGNOSIS — J9621 Acute and chronic respiratory failure with hypoxia: Secondary | ICD-10-CM | POA: Diagnosis not present

## 2021-09-03 DIAGNOSIS — I959 Hypotension, unspecified: Secondary | ICD-10-CM | POA: Diagnosis not present

## 2021-09-03 DIAGNOSIS — J449 Chronic obstructive pulmonary disease, unspecified: Secondary | ICD-10-CM | POA: Diagnosis not present

## 2021-09-03 DIAGNOSIS — Z431 Encounter for attention to gastrostomy: Secondary | ICD-10-CM | POA: Diagnosis not present

## 2021-09-03 DIAGNOSIS — R1310 Dysphagia, unspecified: Secondary | ICD-10-CM | POA: Diagnosis not present

## 2021-09-03 DIAGNOSIS — G894 Chronic pain syndrome: Secondary | ICD-10-CM | POA: Diagnosis not present

## 2021-09-03 DIAGNOSIS — D638 Anemia in other chronic diseases classified elsewhere: Secondary | ICD-10-CM | POA: Diagnosis not present

## 2021-09-03 DIAGNOSIS — G4089 Other seizures: Secondary | ICD-10-CM | POA: Diagnosis not present

## 2021-09-03 DIAGNOSIS — I5023 Acute on chronic systolic (congestive) heart failure: Secondary | ICD-10-CM | POA: Diagnosis not present

## 2021-09-03 DIAGNOSIS — Z430 Encounter for attention to tracheostomy: Secondary | ICD-10-CM | POA: Diagnosis not present

## 2021-09-03 DIAGNOSIS — G4733 Obstructive sleep apnea (adult) (pediatric): Secondary | ICD-10-CM | POA: Diagnosis not present

## 2021-09-03 DIAGNOSIS — G319 Degenerative disease of nervous system, unspecified: Secondary | ICD-10-CM | POA: Diagnosis not present

## 2021-09-03 DIAGNOSIS — K219 Gastro-esophageal reflux disease without esophagitis: Secondary | ICD-10-CM | POA: Diagnosis not present

## 2021-09-03 DIAGNOSIS — E039 Hypothyroidism, unspecified: Secondary | ICD-10-CM | POA: Diagnosis not present

## 2021-09-03 DIAGNOSIS — Z9911 Dependence on respirator [ventilator] status: Secondary | ICD-10-CM | POA: Diagnosis not present

## 2021-09-05 DIAGNOSIS — Z431 Encounter for attention to gastrostomy: Secondary | ICD-10-CM | POA: Diagnosis not present

## 2021-09-05 DIAGNOSIS — I480 Paroxysmal atrial fibrillation: Secondary | ICD-10-CM | POA: Diagnosis not present

## 2021-09-05 DIAGNOSIS — J9620 Acute and chronic respiratory failure, unspecified whether with hypoxia or hypercapnia: Secondary | ICD-10-CM | POA: Diagnosis not present

## 2021-09-05 DIAGNOSIS — I5023 Acute on chronic systolic (congestive) heart failure: Secondary | ICD-10-CM | POA: Diagnosis not present

## 2021-09-05 DIAGNOSIS — Z430 Encounter for attention to tracheostomy: Secondary | ICD-10-CM | POA: Diagnosis not present

## 2021-09-05 DIAGNOSIS — J9621 Acute and chronic respiratory failure with hypoxia: Secondary | ICD-10-CM | POA: Diagnosis not present

## 2021-09-05 DIAGNOSIS — I82402 Acute embolism and thrombosis of unspecified deep veins of left lower extremity: Secondary | ICD-10-CM | POA: Diagnosis not present

## 2021-09-06 DIAGNOSIS — Z431 Encounter for attention to gastrostomy: Secondary | ICD-10-CM | POA: Diagnosis not present

## 2021-09-06 DIAGNOSIS — I480 Paroxysmal atrial fibrillation: Secondary | ICD-10-CM | POA: Diagnosis not present

## 2021-09-06 DIAGNOSIS — Z430 Encounter for attention to tracheostomy: Secondary | ICD-10-CM | POA: Diagnosis not present

## 2021-09-06 DIAGNOSIS — I82402 Acute embolism and thrombosis of unspecified deep veins of left lower extremity: Secondary | ICD-10-CM | POA: Diagnosis not present

## 2021-09-06 DIAGNOSIS — J9621 Acute and chronic respiratory failure with hypoxia: Secondary | ICD-10-CM | POA: Diagnosis not present

## 2021-09-06 DIAGNOSIS — I5023 Acute on chronic systolic (congestive) heart failure: Secondary | ICD-10-CM | POA: Diagnosis not present

## 2021-09-06 MED FILL — Fentanyl Citrate Preservative Free (PF) Inj 100 MCG/2ML: INTRAMUSCULAR | Qty: 1 | Status: AC

## 2021-09-08 DIAGNOSIS — J9620 Acute and chronic respiratory failure, unspecified whether with hypoxia or hypercapnia: Secondary | ICD-10-CM | POA: Diagnosis not present

## 2021-09-09 DIAGNOSIS — Z430 Encounter for attention to tracheostomy: Secondary | ICD-10-CM | POA: Diagnosis not present

## 2021-09-09 DIAGNOSIS — I480 Paroxysmal atrial fibrillation: Secondary | ICD-10-CM | POA: Diagnosis not present

## 2021-09-09 DIAGNOSIS — I5023 Acute on chronic systolic (congestive) heart failure: Secondary | ICD-10-CM | POA: Diagnosis not present

## 2021-09-09 DIAGNOSIS — Z431 Encounter for attention to gastrostomy: Secondary | ICD-10-CM | POA: Diagnosis not present

## 2021-09-09 DIAGNOSIS — I82402 Acute embolism and thrombosis of unspecified deep veins of left lower extremity: Secondary | ICD-10-CM | POA: Diagnosis not present

## 2021-09-09 DIAGNOSIS — J9621 Acute and chronic respiratory failure with hypoxia: Secondary | ICD-10-CM | POA: Diagnosis not present

## 2021-09-11 DIAGNOSIS — Z430 Encounter for attention to tracheostomy: Secondary | ICD-10-CM | POA: Diagnosis not present

## 2021-09-11 DIAGNOSIS — I5023 Acute on chronic systolic (congestive) heart failure: Secondary | ICD-10-CM | POA: Diagnosis not present

## 2021-09-11 DIAGNOSIS — J9621 Acute and chronic respiratory failure with hypoxia: Secondary | ICD-10-CM | POA: Diagnosis not present

## 2021-09-11 DIAGNOSIS — I82402 Acute embolism and thrombosis of unspecified deep veins of left lower extremity: Secondary | ICD-10-CM | POA: Diagnosis not present

## 2021-09-11 DIAGNOSIS — Z431 Encounter for attention to gastrostomy: Secondary | ICD-10-CM | POA: Diagnosis not present

## 2021-09-11 DIAGNOSIS — I480 Paroxysmal atrial fibrillation: Secondary | ICD-10-CM | POA: Diagnosis not present

## 2021-09-13 DIAGNOSIS — J9621 Acute and chronic respiratory failure with hypoxia: Secondary | ICD-10-CM | POA: Diagnosis not present

## 2021-09-13 DIAGNOSIS — I82402 Acute embolism and thrombosis of unspecified deep veins of left lower extremity: Secondary | ICD-10-CM | POA: Diagnosis not present

## 2021-09-13 DIAGNOSIS — I5023 Acute on chronic systolic (congestive) heart failure: Secondary | ICD-10-CM | POA: Diagnosis not present

## 2021-09-13 DIAGNOSIS — Z431 Encounter for attention to gastrostomy: Secondary | ICD-10-CM | POA: Diagnosis not present

## 2021-09-13 DIAGNOSIS — I480 Paroxysmal atrial fibrillation: Secondary | ICD-10-CM | POA: Diagnosis not present

## 2021-09-13 DIAGNOSIS — Z430 Encounter for attention to tracheostomy: Secondary | ICD-10-CM | POA: Diagnosis not present

## 2021-09-14 DIAGNOSIS — I480 Paroxysmal atrial fibrillation: Secondary | ICD-10-CM | POA: Diagnosis not present

## 2021-09-14 DIAGNOSIS — I82402 Acute embolism and thrombosis of unspecified deep veins of left lower extremity: Secondary | ICD-10-CM | POA: Diagnosis not present

## 2021-09-14 DIAGNOSIS — Z431 Encounter for attention to gastrostomy: Secondary | ICD-10-CM | POA: Diagnosis not present

## 2021-09-14 DIAGNOSIS — I5023 Acute on chronic systolic (congestive) heart failure: Secondary | ICD-10-CM | POA: Diagnosis not present

## 2021-09-14 DIAGNOSIS — Z430 Encounter for attention to tracheostomy: Secondary | ICD-10-CM | POA: Diagnosis not present

## 2021-09-14 DIAGNOSIS — J9621 Acute and chronic respiratory failure with hypoxia: Secondary | ICD-10-CM | POA: Diagnosis not present

## 2021-09-14 DIAGNOSIS — R0989 Other specified symptoms and signs involving the circulatory and respiratory systems: Secondary | ICD-10-CM | POA: Diagnosis not present

## 2021-09-16 DIAGNOSIS — I480 Paroxysmal atrial fibrillation: Secondary | ICD-10-CM | POA: Diagnosis not present

## 2021-09-16 DIAGNOSIS — I5023 Acute on chronic systolic (congestive) heart failure: Secondary | ICD-10-CM | POA: Diagnosis not present

## 2021-09-16 DIAGNOSIS — Z431 Encounter for attention to gastrostomy: Secondary | ICD-10-CM | POA: Diagnosis not present

## 2021-09-16 DIAGNOSIS — I82402 Acute embolism and thrombosis of unspecified deep veins of left lower extremity: Secondary | ICD-10-CM | POA: Diagnosis not present

## 2021-09-16 DIAGNOSIS — J9621 Acute and chronic respiratory failure with hypoxia: Secondary | ICD-10-CM | POA: Diagnosis not present

## 2021-09-16 DIAGNOSIS — Z430 Encounter for attention to tracheostomy: Secondary | ICD-10-CM | POA: Diagnosis not present

## 2021-09-17 DIAGNOSIS — I480 Paroxysmal atrial fibrillation: Secondary | ICD-10-CM | POA: Diagnosis not present

## 2021-09-17 DIAGNOSIS — J9621 Acute and chronic respiratory failure with hypoxia: Secondary | ICD-10-CM | POA: Diagnosis not present

## 2021-09-17 DIAGNOSIS — I5023 Acute on chronic systolic (congestive) heart failure: Secondary | ICD-10-CM | POA: Diagnosis not present

## 2021-09-17 DIAGNOSIS — I82402 Acute embolism and thrombosis of unspecified deep veins of left lower extremity: Secondary | ICD-10-CM | POA: Diagnosis not present

## 2021-09-17 DIAGNOSIS — Z431 Encounter for attention to gastrostomy: Secondary | ICD-10-CM | POA: Diagnosis not present

## 2021-09-17 DIAGNOSIS — Z430 Encounter for attention to tracheostomy: Secondary | ICD-10-CM | POA: Diagnosis not present

## 2021-09-18 DIAGNOSIS — I480 Paroxysmal atrial fibrillation: Secondary | ICD-10-CM | POA: Diagnosis not present

## 2021-09-18 DIAGNOSIS — Z430 Encounter for attention to tracheostomy: Secondary | ICD-10-CM | POA: Diagnosis not present

## 2021-09-18 DIAGNOSIS — Z431 Encounter for attention to gastrostomy: Secondary | ICD-10-CM | POA: Diagnosis not present

## 2021-09-18 DIAGNOSIS — I5023 Acute on chronic systolic (congestive) heart failure: Secondary | ICD-10-CM | POA: Diagnosis not present

## 2021-09-18 DIAGNOSIS — J9621 Acute and chronic respiratory failure with hypoxia: Secondary | ICD-10-CM | POA: Diagnosis not present

## 2021-09-18 DIAGNOSIS — I82402 Acute embolism and thrombosis of unspecified deep veins of left lower extremity: Secondary | ICD-10-CM | POA: Diagnosis not present

## 2021-09-19 DIAGNOSIS — I480 Paroxysmal atrial fibrillation: Secondary | ICD-10-CM | POA: Diagnosis not present

## 2021-09-19 DIAGNOSIS — J9621 Acute and chronic respiratory failure with hypoxia: Secondary | ICD-10-CM | POA: Diagnosis not present

## 2021-09-19 DIAGNOSIS — J9620 Acute and chronic respiratory failure, unspecified whether with hypoxia or hypercapnia: Secondary | ICD-10-CM | POA: Diagnosis not present

## 2021-09-19 DIAGNOSIS — Z430 Encounter for attention to tracheostomy: Secondary | ICD-10-CM | POA: Diagnosis not present

## 2021-09-19 DIAGNOSIS — I5023 Acute on chronic systolic (congestive) heart failure: Secondary | ICD-10-CM | POA: Diagnosis not present

## 2021-09-19 DIAGNOSIS — I82402 Acute embolism and thrombosis of unspecified deep veins of left lower extremity: Secondary | ICD-10-CM | POA: Diagnosis not present

## 2021-09-19 DIAGNOSIS — Z431 Encounter for attention to gastrostomy: Secondary | ICD-10-CM | POA: Diagnosis not present

## 2021-09-20 DIAGNOSIS — G4089 Other seizures: Secondary | ICD-10-CM | POA: Diagnosis not present

## 2021-09-20 DIAGNOSIS — G319 Degenerative disease of nervous system, unspecified: Secondary | ICD-10-CM | POA: Diagnosis not present

## 2021-09-20 DIAGNOSIS — I739 Peripheral vascular disease, unspecified: Secondary | ICD-10-CM | POA: Diagnosis not present

## 2021-09-20 DIAGNOSIS — I82402 Acute embolism and thrombosis of unspecified deep veins of left lower extremity: Secondary | ICD-10-CM | POA: Diagnosis not present

## 2021-09-20 DIAGNOSIS — M6281 Muscle weakness (generalized): Secondary | ICD-10-CM | POA: Diagnosis not present

## 2021-09-20 DIAGNOSIS — Z9911 Dependence on respirator [ventilator] status: Secondary | ICD-10-CM | POA: Diagnosis not present

## 2021-09-20 DIAGNOSIS — I480 Paroxysmal atrial fibrillation: Secondary | ICD-10-CM | POA: Diagnosis not present

## 2021-09-20 DIAGNOSIS — J449 Chronic obstructive pulmonary disease, unspecified: Secondary | ICD-10-CM | POA: Diagnosis not present

## 2021-09-20 DIAGNOSIS — D638 Anemia in other chronic diseases classified elsewhere: Secondary | ICD-10-CM | POA: Diagnosis not present

## 2021-09-20 DIAGNOSIS — J9621 Acute and chronic respiratory failure with hypoxia: Secondary | ICD-10-CM | POA: Diagnosis not present

## 2021-09-20 DIAGNOSIS — Z431 Encounter for attention to gastrostomy: Secondary | ICD-10-CM | POA: Diagnosis not present

## 2021-09-20 DIAGNOSIS — I5023 Acute on chronic systolic (congestive) heart failure: Secondary | ICD-10-CM | POA: Diagnosis not present

## 2021-09-20 DIAGNOSIS — G894 Chronic pain syndrome: Secondary | ICD-10-CM | POA: Diagnosis not present

## 2021-09-20 DIAGNOSIS — G4733 Obstructive sleep apnea (adult) (pediatric): Secondary | ICD-10-CM | POA: Diagnosis not present

## 2021-09-20 DIAGNOSIS — K219 Gastro-esophageal reflux disease without esophagitis: Secondary | ICD-10-CM | POA: Diagnosis not present

## 2021-09-20 DIAGNOSIS — R1310 Dysphagia, unspecified: Secondary | ICD-10-CM | POA: Diagnosis not present

## 2021-09-20 DIAGNOSIS — I959 Hypotension, unspecified: Secondary | ICD-10-CM | POA: Diagnosis not present

## 2021-09-20 DIAGNOSIS — Z430 Encounter for attention to tracheostomy: Secondary | ICD-10-CM | POA: Diagnosis not present

## 2021-09-20 DIAGNOSIS — R488 Other symbolic dysfunctions: Secondary | ICD-10-CM | POA: Diagnosis not present

## 2021-09-20 DIAGNOSIS — E039 Hypothyroidism, unspecified: Secondary | ICD-10-CM | POA: Diagnosis not present

## 2021-09-22 DIAGNOSIS — I480 Paroxysmal atrial fibrillation: Secondary | ICD-10-CM | POA: Diagnosis not present

## 2021-09-22 DIAGNOSIS — R488 Other symbolic dysfunctions: Secondary | ICD-10-CM | POA: Diagnosis not present

## 2021-09-22 DIAGNOSIS — Z430 Encounter for attention to tracheostomy: Secondary | ICD-10-CM | POA: Diagnosis not present

## 2021-09-22 DIAGNOSIS — Z431 Encounter for attention to gastrostomy: Secondary | ICD-10-CM | POA: Diagnosis not present

## 2021-09-22 DIAGNOSIS — M6281 Muscle weakness (generalized): Secondary | ICD-10-CM | POA: Diagnosis not present

## 2021-09-22 DIAGNOSIS — J9620 Acute and chronic respiratory failure, unspecified whether with hypoxia or hypercapnia: Secondary | ICD-10-CM | POA: Diagnosis not present

## 2021-09-22 DIAGNOSIS — J9621 Acute and chronic respiratory failure with hypoxia: Secondary | ICD-10-CM | POA: Diagnosis not present

## 2021-09-24 DIAGNOSIS — Z431 Encounter for attention to gastrostomy: Secondary | ICD-10-CM | POA: Diagnosis not present

## 2021-09-24 DIAGNOSIS — I480 Paroxysmal atrial fibrillation: Secondary | ICD-10-CM | POA: Diagnosis not present

## 2021-09-24 DIAGNOSIS — Z430 Encounter for attention to tracheostomy: Secondary | ICD-10-CM | POA: Diagnosis not present

## 2021-09-24 DIAGNOSIS — R488 Other symbolic dysfunctions: Secondary | ICD-10-CM | POA: Diagnosis not present

## 2021-09-24 DIAGNOSIS — J9621 Acute and chronic respiratory failure with hypoxia: Secondary | ICD-10-CM | POA: Diagnosis not present

## 2021-09-24 DIAGNOSIS — M6281 Muscle weakness (generalized): Secondary | ICD-10-CM | POA: Diagnosis not present

## 2021-09-25 DIAGNOSIS — J9621 Acute and chronic respiratory failure with hypoxia: Secondary | ICD-10-CM | POA: Diagnosis not present

## 2021-09-25 DIAGNOSIS — M6281 Muscle weakness (generalized): Secondary | ICD-10-CM | POA: Diagnosis not present

## 2021-09-25 DIAGNOSIS — I480 Paroxysmal atrial fibrillation: Secondary | ICD-10-CM | POA: Diagnosis not present

## 2021-09-25 DIAGNOSIS — R488 Other symbolic dysfunctions: Secondary | ICD-10-CM | POA: Diagnosis not present

## 2021-09-25 DIAGNOSIS — Z430 Encounter for attention to tracheostomy: Secondary | ICD-10-CM | POA: Diagnosis not present

## 2021-09-25 DIAGNOSIS — Z431 Encounter for attention to gastrostomy: Secondary | ICD-10-CM | POA: Diagnosis not present

## 2021-09-26 DIAGNOSIS — J9621 Acute and chronic respiratory failure with hypoxia: Secondary | ICD-10-CM | POA: Diagnosis not present

## 2021-09-26 DIAGNOSIS — Z431 Encounter for attention to gastrostomy: Secondary | ICD-10-CM | POA: Diagnosis not present

## 2021-09-26 DIAGNOSIS — I480 Paroxysmal atrial fibrillation: Secondary | ICD-10-CM | POA: Diagnosis not present

## 2021-09-26 DIAGNOSIS — R488 Other symbolic dysfunctions: Secondary | ICD-10-CM | POA: Diagnosis not present

## 2021-09-26 DIAGNOSIS — M6281 Muscle weakness (generalized): Secondary | ICD-10-CM | POA: Diagnosis not present

## 2021-09-26 DIAGNOSIS — Z430 Encounter for attention to tracheostomy: Secondary | ICD-10-CM | POA: Diagnosis not present

## 2021-09-27 DIAGNOSIS — R488 Other symbolic dysfunctions: Secondary | ICD-10-CM | POA: Diagnosis not present

## 2021-09-27 DIAGNOSIS — Z431 Encounter for attention to gastrostomy: Secondary | ICD-10-CM | POA: Diagnosis not present

## 2021-09-27 DIAGNOSIS — Z430 Encounter for attention to tracheostomy: Secondary | ICD-10-CM | POA: Diagnosis not present

## 2021-09-27 DIAGNOSIS — J9621 Acute and chronic respiratory failure with hypoxia: Secondary | ICD-10-CM | POA: Diagnosis not present

## 2021-09-27 DIAGNOSIS — M6281 Muscle weakness (generalized): Secondary | ICD-10-CM | POA: Diagnosis not present

## 2021-09-27 DIAGNOSIS — I480 Paroxysmal atrial fibrillation: Secondary | ICD-10-CM | POA: Diagnosis not present

## 2021-09-30 DIAGNOSIS — M6281 Muscle weakness (generalized): Secondary | ICD-10-CM | POA: Diagnosis not present

## 2021-09-30 DIAGNOSIS — Z430 Encounter for attention to tracheostomy: Secondary | ICD-10-CM | POA: Diagnosis not present

## 2021-09-30 DIAGNOSIS — R488 Other symbolic dysfunctions: Secondary | ICD-10-CM | POA: Diagnosis not present

## 2021-09-30 DIAGNOSIS — I480 Paroxysmal atrial fibrillation: Secondary | ICD-10-CM | POA: Diagnosis not present

## 2021-09-30 DIAGNOSIS — Z431 Encounter for attention to gastrostomy: Secondary | ICD-10-CM | POA: Diagnosis not present

## 2021-09-30 DIAGNOSIS — J9621 Acute and chronic respiratory failure with hypoxia: Secondary | ICD-10-CM | POA: Diagnosis not present

## 2021-10-01 DIAGNOSIS — Z431 Encounter for attention to gastrostomy: Secondary | ICD-10-CM | POA: Diagnosis not present

## 2021-10-01 DIAGNOSIS — I480 Paroxysmal atrial fibrillation: Secondary | ICD-10-CM | POA: Diagnosis not present

## 2021-10-01 DIAGNOSIS — Z430 Encounter for attention to tracheostomy: Secondary | ICD-10-CM | POA: Diagnosis not present

## 2021-10-01 DIAGNOSIS — R488 Other symbolic dysfunctions: Secondary | ICD-10-CM | POA: Diagnosis not present

## 2021-10-01 DIAGNOSIS — M6281 Muscle weakness (generalized): Secondary | ICD-10-CM | POA: Diagnosis not present

## 2021-10-01 DIAGNOSIS — J9621 Acute and chronic respiratory failure with hypoxia: Secondary | ICD-10-CM | POA: Diagnosis not present

## 2021-10-02 DIAGNOSIS — M6281 Muscle weakness (generalized): Secondary | ICD-10-CM | POA: Diagnosis not present

## 2021-10-02 DIAGNOSIS — R488 Other symbolic dysfunctions: Secondary | ICD-10-CM | POA: Diagnosis not present

## 2021-10-02 DIAGNOSIS — I480 Paroxysmal atrial fibrillation: Secondary | ICD-10-CM | POA: Diagnosis not present

## 2021-10-02 DIAGNOSIS — Z430 Encounter for attention to tracheostomy: Secondary | ICD-10-CM | POA: Diagnosis not present

## 2021-10-02 DIAGNOSIS — J9621 Acute and chronic respiratory failure with hypoxia: Secondary | ICD-10-CM | POA: Diagnosis not present

## 2021-10-02 DIAGNOSIS — Z431 Encounter for attention to gastrostomy: Secondary | ICD-10-CM | POA: Diagnosis not present

## 2021-10-03 DIAGNOSIS — J9620 Acute and chronic respiratory failure, unspecified whether with hypoxia or hypercapnia: Secondary | ICD-10-CM | POA: Diagnosis not present

## 2021-10-06 DIAGNOSIS — J9620 Acute and chronic respiratory failure, unspecified whether with hypoxia or hypercapnia: Secondary | ICD-10-CM | POA: Diagnosis not present

## 2021-10-08 DIAGNOSIS — Z430 Encounter for attention to tracheostomy: Secondary | ICD-10-CM | POA: Diagnosis not present

## 2021-10-08 DIAGNOSIS — Z431 Encounter for attention to gastrostomy: Secondary | ICD-10-CM | POA: Diagnosis not present

## 2021-10-08 DIAGNOSIS — I480 Paroxysmal atrial fibrillation: Secondary | ICD-10-CM | POA: Diagnosis not present

## 2021-10-08 DIAGNOSIS — M6281 Muscle weakness (generalized): Secondary | ICD-10-CM | POA: Diagnosis not present

## 2021-10-08 DIAGNOSIS — J9621 Acute and chronic respiratory failure with hypoxia: Secondary | ICD-10-CM | POA: Diagnosis not present

## 2021-10-08 DIAGNOSIS — R488 Other symbolic dysfunctions: Secondary | ICD-10-CM | POA: Diagnosis not present

## 2021-10-15 DIAGNOSIS — Z430 Encounter for attention to tracheostomy: Secondary | ICD-10-CM | POA: Diagnosis not present

## 2021-10-15 DIAGNOSIS — I480 Paroxysmal atrial fibrillation: Secondary | ICD-10-CM | POA: Diagnosis not present

## 2021-10-15 DIAGNOSIS — J9621 Acute and chronic respiratory failure with hypoxia: Secondary | ICD-10-CM | POA: Diagnosis not present

## 2021-10-15 DIAGNOSIS — R488 Other symbolic dysfunctions: Secondary | ICD-10-CM | POA: Diagnosis not present

## 2021-10-15 DIAGNOSIS — M6281 Muscle weakness (generalized): Secondary | ICD-10-CM | POA: Diagnosis not present

## 2021-10-15 DIAGNOSIS — Z431 Encounter for attention to gastrostomy: Secondary | ICD-10-CM | POA: Diagnosis not present

## 2021-10-15 DIAGNOSIS — J9611 Chronic respiratory failure with hypoxia: Secondary | ICD-10-CM | POA: Diagnosis not present

## 2021-10-17 DIAGNOSIS — Z23 Encounter for immunization: Secondary | ICD-10-CM | POA: Diagnosis not present

## 2021-10-21 DIAGNOSIS — J9620 Acute and chronic respiratory failure, unspecified whether with hypoxia or hypercapnia: Secondary | ICD-10-CM | POA: Diagnosis not present

## 2021-10-22 ENCOUNTER — Emergency Department (HOSPITAL_COMMUNITY)
Admission: EM | Admit: 2021-10-22 | Discharge: 2021-10-22 | Disposition: A | Discharge status: Skilled Nursing Facility | Payer: Medicare Other | Attending: Emergency Medicine | Admitting: Emergency Medicine

## 2021-10-22 ENCOUNTER — Encounter (HOSPITAL_COMMUNITY): Payer: Self-pay | Admitting: Emergency Medicine

## 2021-10-22 ENCOUNTER — Other Ambulatory Visit: Payer: Self-pay

## 2021-10-22 DIAGNOSIS — R488 Other symbolic dysfunctions: Secondary | ICD-10-CM | POA: Diagnosis not present

## 2021-10-22 DIAGNOSIS — D72829 Elevated white blood cell count, unspecified: Secondary | ICD-10-CM | POA: Insufficient documentation

## 2021-10-22 DIAGNOSIS — E039 Hypothyroidism, unspecified: Secondary | ICD-10-CM | POA: Diagnosis not present

## 2021-10-22 DIAGNOSIS — K219 Gastro-esophageal reflux disease without esophagitis: Secondary | ICD-10-CM | POA: Diagnosis not present

## 2021-10-22 DIAGNOSIS — G894 Chronic pain syndrome: Secondary | ICD-10-CM | POA: Diagnosis not present

## 2021-10-22 DIAGNOSIS — I82402 Acute embolism and thrombosis of unspecified deep veins of left lower extremity: Secondary | ICD-10-CM | POA: Diagnosis not present

## 2021-10-22 DIAGNOSIS — R1310 Dysphagia, unspecified: Secondary | ICD-10-CM | POA: Diagnosis not present

## 2021-10-22 DIAGNOSIS — M6281 Muscle weakness (generalized): Secondary | ICD-10-CM | POA: Diagnosis not present

## 2021-10-22 DIAGNOSIS — I480 Paroxysmal atrial fibrillation: Secondary | ICD-10-CM | POA: Diagnosis not present

## 2021-10-22 DIAGNOSIS — J449 Chronic obstructive pulmonary disease, unspecified: Secondary | ICD-10-CM | POA: Diagnosis not present

## 2021-10-22 DIAGNOSIS — G4089 Other seizures: Secondary | ICD-10-CM | POA: Diagnosis not present

## 2021-10-22 DIAGNOSIS — G40909 Epilepsy, unspecified, not intractable, without status epilepticus: Secondary | ICD-10-CM | POA: Diagnosis not present

## 2021-10-22 DIAGNOSIS — Z9911 Dependence on respirator [ventilator] status: Secondary | ICD-10-CM | POA: Diagnosis not present

## 2021-10-22 DIAGNOSIS — I739 Peripheral vascular disease, unspecified: Secondary | ICD-10-CM | POA: Diagnosis not present

## 2021-10-22 DIAGNOSIS — D638 Anemia in other chronic diseases classified elsewhere: Secondary | ICD-10-CM | POA: Diagnosis not present

## 2021-10-22 DIAGNOSIS — I959 Hypotension, unspecified: Secondary | ICD-10-CM | POA: Diagnosis not present

## 2021-10-22 DIAGNOSIS — Z430 Encounter for attention to tracheostomy: Secondary | ICD-10-CM | POA: Diagnosis not present

## 2021-10-22 DIAGNOSIS — J9621 Acute and chronic respiratory failure with hypoxia: Secondary | ICD-10-CM | POA: Diagnosis not present

## 2021-10-22 DIAGNOSIS — Z431 Encounter for attention to gastrostomy: Secondary | ICD-10-CM | POA: Diagnosis not present

## 2021-10-22 DIAGNOSIS — R569 Unspecified convulsions: Secondary | ICD-10-CM | POA: Diagnosis not present

## 2021-10-22 DIAGNOSIS — G319 Degenerative disease of nervous system, unspecified: Secondary | ICD-10-CM | POA: Diagnosis not present

## 2021-10-22 DIAGNOSIS — G4733 Obstructive sleep apnea (adult) (pediatric): Secondary | ICD-10-CM | POA: Diagnosis not present

## 2021-10-22 DIAGNOSIS — I5023 Acute on chronic systolic (congestive) heart failure: Secondary | ICD-10-CM | POA: Diagnosis not present

## 2021-10-22 LAB — CBC
HCT: 36.9 % — ABNORMAL LOW (ref 39.0–52.0)
Hemoglobin: 11.3 g/dL — ABNORMAL LOW (ref 13.0–17.0)
MCH: 27.4 pg (ref 26.0–34.0)
MCHC: 30.6 g/dL (ref 30.0–36.0)
MCV: 89.3 fL (ref 80.0–100.0)
Platelets: 367 10*3/uL (ref 150–400)
RBC: 4.13 MIL/uL — ABNORMAL LOW (ref 4.22–5.81)
RDW: 17.2 % — ABNORMAL HIGH (ref 11.5–15.5)
WBC: 12.7 10*3/uL — ABNORMAL HIGH (ref 4.0–10.5)
nRBC: 0 % (ref 0.0–0.2)

## 2021-10-22 LAB — BASIC METABOLIC PANEL
Anion gap: 9 (ref 5–15)
BUN: 20 mg/dL (ref 8–23)
CO2: 34 mmol/L — ABNORMAL HIGH (ref 22–32)
Calcium: 9.5 mg/dL (ref 8.9–10.3)
Chloride: 96 mmol/L — ABNORMAL LOW (ref 98–111)
Creatinine, Ser: 0.49 mg/dL — ABNORMAL LOW (ref 0.61–1.24)
GFR, Estimated: >60 mL/min (ref >60)
Glucose, Bld: 164 mg/dL — ABNORMAL HIGH (ref 70–99)
Potassium: 5.1 mmol/L (ref 3.5–5.1)
Sodium: 139 mmol/L (ref 135–145)

## 2021-10-22 MED ORDER — SODIUM CHLORIDE 0.9 % IV BOLUS
500.0000 mL | Freq: Once | INTRAVENOUS | Status: AC
  Administered 2021-10-22: 500 mL via INTRAVENOUS

## 2021-10-22 MED ORDER — LEVETIRACETAM IN NACL 1500 MG/100ML IV SOLN
1500.0000 mg | Freq: Once | INTRAVENOUS | Status: AC
  Administered 2021-10-22: 1500 mg via INTRAVENOUS
  Filled 2021-10-22: qty 100

## 2021-10-22 MED ORDER — MIDODRINE HCL 5 MG PO TABS
10.0000 mg | ORAL_TABLET | Freq: Three times a day (TID) | ORAL | Status: DC
  Administered 2021-10-22: 10 mg
  Filled 2021-10-22: qty 2

## 2021-10-22 NOTE — Discharge Instructions (Signed)
We are going to keep you on your present dose of Keppra.  I spoke with neurology and they want to continue that to, but also wants you to follow-up in the next month with [facility] neurology who you have seen before

## 2021-10-22 NOTE — ED Provider Notes (Signed)
[place] EMERGENCY DEPARTMENT Provider Note   CSN: [id number] Arrival date & time: 10/22/21  9485     History {Add pertinent medical, surgical, social history, OB history to HPI:1} Chief Complaint  Patient presents with   Seizures    Austin Davis is a 81 y.o. male.  Patient with a history of seizures.  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  Patient had a seizure today and was given some Ativan was sent to the emergency department   Seizures      Home Medications Prior to Admission medications   Medication Sig Start Date End Date Taking? Authorizing Provider  acetaminophen (TYLENOL) 325 MG tablet Take 650 mg by mouth every 8 (eight) hours as needed for mild pain or fever.   Yes [provider]  ALPRAZolam Duanne Moron) 0.5 MG tablet Place 0.5 mg into feeding tube every 8 (eight) hours as needed for anxiety.   Yes [provider]  esomeprazole (NEXIUM) 40 MG capsule Take 40 mg by mouth daily. Via feeding tube   Yes [provider]  fexofenadine (ALLEGRA) 60 MG tablet Take 60 mg by mouth daily.   Yes [provider]  fluticasone (FLONASE) 50 MCG/ACT nasal spray Place 2 sprays into both nostrils in the morning and at bedtime.   Yes [provider]  folic acid (FOLVITE) 1 MG tablet Take 1 tablet (1 mg total) by mouth daily. Patient taking differently: Place 1 mg into feeding tube daily. 09/29/20  Yes [name], MD  furosemide (LASIX) 20 MG tablet Place 1 tablet (20 mg total) into feeding tube daily as needed for fluid or edema. Patient taking differently: Place 20 mg into feeding tube daily. 07/03/21  Yes Gonfa, Charlesetta Ivory, MD  GERI-TUSSIN 100 MG/5ML liquid Place 400 mg into feeding tube 3 (three) times daily.   Yes [provider]  hydrOXYzine (ATARAX) 25 MG tablet Place 25 mg into feeding tube every 6 (six) hours as needed for anxiety.   Yes [provider]  lactulose  (CHRONULAC) 10 GM/15ML solution Place 20 g into feeding tube every 12 (twelve) hours as needed (constipation).   Yes [provider]  levalbuterol (XOPENEX) 1.25 MG/3ML nebulizer solution Take 1.25 mg by nebulization every 6 (six) hours.   Yes [provider]  levETIRAcetam (KEPPRA) 100 MG/ML solution Place 1,500 mg into feeding tube in the morning and at bedtime.   Yes [provider]  levothyroxine (SYNTHROID) 25 MCG tablet Place 25 mcg into feeding tube daily.   Yes [provider]  LORazepam (ATIVAN) 2 MG/ML injection 2 mg as needed (seizures).   Yes [provider]  midodrine (PROAMATINE) 10 MG tablet Place 1 tablet (10 mg total) into feeding tube 3 (three) times daily with meals. Patient taking differently: Place 10 mg into feeding tube 3 (three) times daily. 05/01/21  Yes Thurnell Lose, MD  Multiple Vitamin (MULTIVITAMIN WITH MINERALS) TABS tablet Place 1 tablet into feeding tube daily. 05/11/21  Yes Sheikh, Omair Latif, DO  Nutritional Supplements (FEEDING SUPPLEMENT, OSMOLITE 1.5 CAL,) LIQD Place 55 mL/hr into feeding tube See admin instructions. 55 ml/hr for 22-24 hours = 1815-1980 kcal & 1520-1603 ml   Yes [provider]  Omega-3 Fatty Acids (FISH OIL) 1000 MG CAPS Take 1,000 mg by mouth daily.   Yes [provider]  ondansetron (ZOFRAN-ODT) 4 MG disintegrating tablet 4 mg See admin instructions. 4 mg, per tube, every six hours  as needed for nausea with vomiting   Yes [provider]  polyethylene glycol (MIRALAX / GLYCOLAX) 17 g packet Place 17 g into feeding tube every other day.   Yes [provider]  Probiotic Product (CULTURELLE PROBIOTICS PO) Give 1 capsule by tube daily.   Yes [provider]  propranolol (INDERAL) 10 MG tablet Place 1 tablet (10 mg total) into feeding tube daily as needed (for tremors in AM. do not administer if heart rate is below 60 or systolic blood pressure is below  90.). Patient taking differently: Place 10 mg into feeding tube See admin instructions. 10 mg once daly as needed for tremors, HOLD if heart rate is below 60 or systolic blood pressure is below 90. 05/01/21  Yes Thurnell Lose, MD  sertraline (ZOLOFT) 25 MG tablet Place 25 mg into feeding tube daily.   Yes [provider]  Soap & Cleansers (CETAPHIL GENTLE CLEANSER EX) Apply 10 mLs topically daily.   Yes [provider]  tamsulosin (FLOMAX) 0.4 MG CAPS capsule Take 0.4 mg by mouth daily. Via feeding tube   Yes [provider]  bethanechol (URECHOLINE) 10 MG tablet Take 1 tablet (10 mg total) by mouth 3 (three) times daily. Patient not taking: Reported on 08/30/2021 06/26/21   Mercy Riding, MD  diazePAM (VALTOCO 5 MG DOSE) 5 MG/0.1ML LIQD Give 15 mg (0.82ml) intranasal for seizure lasting more than 5 minutes can repeat once after 4 hours.  Do not use more than twice in 24 hours. Patient not taking: Reported on 08/30/2021 06/26/21   Mercy Riding, MD  Nutritional Supplements (FEEDING SUPPLEMENT, JEVITY 1.5 CAL/FIBER,) LIQD Place 1,000 mLs into feeding tube continuous. Patient not taking: Reported on 08/30/2021 05/10/21   Kerney Elbe, DO  Water For Irrigation, Sterile (STERILE WATER FOR IRRIGATION) Irrigate with 100 mLs as directed See admin instructions. 100 ml H2O flush every 4 hours    [provider]      Allergies    Prednisone and Cortisone    Review of Systems   Review of Systems  Neurological:  Positive for seizures.    Physical Exam Updated Vital Signs BP (!) 164/80 (BP Location: Right Arm)   Pulse 76   Temp 98.6 F (37 C) (Axillary)   Resp 16   Ht 5\' 10"  (1.778 m)   Wt 54.9 kg   SpO2 100%   BMI 17.36 kg/m  Physical Exam  ED Results / Procedures / Treatments   Labs (all labs ordered are listed, but only abnormal results are displayed) Labs Reviewed  BASIC METABOLIC PANEL - Abnormal; Notable for the following components:      Result  Value   Chloride 96 (*)    CO2 34 (*)    Glucose, Bld 164 (*)    Creatinine, Ser 0.49 (*)    All other components within normal limits  CBC - Abnormal; Notable for the following components:   WBC 12.7 (*)    RBC 4.13 (*)    Hemoglobin 11.3 (*)    HCT 36.9 (*)    RDW 17.2 (*)    All other components within normal limits  CBG MONITORING, ED    EKG None  Radiology No results found.  Procedures Procedures  {Document cardiac monitor, telemetry assessment procedure when appropriate:1}  Medications Ordered in ED Medications  midodrine (PROAMATINE) tablet 10 mg (10 mg Per Tube Given 10/22/21 1209)  levETIRAcetam (KEPPRA) IVPB 1500 mg/ 100 mL premix (0 mg Intravenous Stopped  10/22/21 1119)  sodium chloride 0.9 % bolus 500 mL (0 mLs Intravenous Stopped 10/22/21 1210)    ED Course/ Medical Decision Making/ A&P  Patient with history of seizures.  I spoke to neurology and they did not want to increase his Keppra for now                         Medical Decision Making Amount and/or Complexity of Data Reviewed Labs: ordered.  Risk Prescription drug management.   Patient with seizures and anxiety.  We will keep him on his present medication and he will follow-up with his neurologist  {Document critical care time when appropriate:1} {Document review of labs and clinical decision tools ie heart score, Chads2Vasc2 etc:1}  {Document your independent review of radiology images, and any outside records:1} {Document your discussion with family members, caretakers, and with consultants:1} {Document social determinants of health affecting pt's care:1} {Document your decision making why or why not admission, treatments were needed:1} Final Clinical Impression(s) / ED Diagnoses Final diagnoses:  Seizure ([place])    Rx / DC Orders ED Discharge Orders     None

## 2021-10-22 NOTE — ED Notes (Signed)
RN called [facility] for transport

## 2021-10-22 NOTE — ED Triage Notes (Signed)
Per [facility] patient coming from [place] hospital for 2 seizures. Patient had seizure around 2 am and was given 1mg  ativan than another seizure around 7am and given 1mg  ativan. Patient has trach and is vent dependent. According to staff patient is at his baseline.

## 2021-10-22 NOTE — ED Notes (Signed)
RN attempted to call [facility], no answer.

## 2021-10-22 NOTE — ED Notes (Signed)
Pt is complaining of HA.

## 2021-10-26 ENCOUNTER — Encounter (HOSPITAL_COMMUNITY): Payer: Self-pay | Admitting: Emergency Medicine

## 2021-10-26 ENCOUNTER — Inpatient Hospital Stay (HOSPITAL_COMMUNITY): Payer: Medicare Other

## 2021-10-26 ENCOUNTER — Emergency Department (HOSPITAL_COMMUNITY): Payer: Medicare Other

## 2021-10-26 ENCOUNTER — Inpatient Hospital Stay (HOSPITAL_COMMUNITY)
Admission: EM | Admit: 2021-10-26 | Discharge: 2021-11-01 | DRG: 207 | Disposition: A | Discharge status: Skilled Nursing Facility | Payer: Medicare Other | Source: Skilled Nursing Facility | Attending: Internal Medicine | Admitting: Internal Medicine

## 2021-10-26 ENCOUNTER — Other Ambulatory Visit: Payer: Self-pay

## 2021-10-26 DIAGNOSIS — I959 Hypotension, unspecified: Secondary | ICD-10-CM | POA: Diagnosis not present

## 2021-10-26 DIAGNOSIS — Z95828 Presence of other vascular implants and grafts: Secondary | ICD-10-CM

## 2021-10-26 DIAGNOSIS — R1310 Dysphagia, unspecified: Secondary | ICD-10-CM | POA: Diagnosis not present

## 2021-10-26 DIAGNOSIS — G40909 Epilepsy, unspecified, not intractable, without status epilepticus: Secondary | ICD-10-CM | POA: Diagnosis present

## 2021-10-26 DIAGNOSIS — Y95 Nosocomial condition: Secondary | ICD-10-CM | POA: Diagnosis present

## 2021-10-26 DIAGNOSIS — I480 Paroxysmal atrial fibrillation: Secondary | ICD-10-CM | POA: Diagnosis present

## 2021-10-26 DIAGNOSIS — J9620 Acute and chronic respiratory failure, unspecified whether with hypoxia or hypercapnia: Secondary | ICD-10-CM | POA: Diagnosis not present

## 2021-10-26 DIAGNOSIS — J9622 Acute and chronic respiratory failure with hypercapnia: Secondary | ICD-10-CM | POA: Diagnosis present

## 2021-10-26 DIAGNOSIS — Z952 Presence of prosthetic heart valve: Secondary | ICD-10-CM | POA: Diagnosis not present

## 2021-10-26 DIAGNOSIS — E46 Unspecified protein-calorie malnutrition: Secondary | ICD-10-CM | POA: Diagnosis present

## 2021-10-26 DIAGNOSIS — I5042 Chronic combined systolic (congestive) and diastolic (congestive) heart failure: Secondary | ICD-10-CM | POA: Diagnosis present

## 2021-10-26 DIAGNOSIS — I9589 Other hypotension: Secondary | ICD-10-CM | POA: Diagnosis not present

## 2021-10-26 DIAGNOSIS — Z79890 Hormone replacement therapy (postmenopausal): Secondary | ICD-10-CM

## 2021-10-26 DIAGNOSIS — J90 Pleural effusion, not elsewhere classified: Secondary | ICD-10-CM | POA: Diagnosis not present

## 2021-10-26 DIAGNOSIS — D6489 Other specified anemias: Secondary | ICD-10-CM | POA: Diagnosis present

## 2021-10-26 DIAGNOSIS — R64 Cachexia: Secondary | ICD-10-CM | POA: Diagnosis present

## 2021-10-26 DIAGNOSIS — F32A Depression, unspecified: Secondary | ICD-10-CM | POA: Diagnosis present

## 2021-10-26 DIAGNOSIS — Z1624 Resistance to multiple antibiotics: Secondary | ICD-10-CM | POA: Diagnosis not present

## 2021-10-26 DIAGNOSIS — J9621 Acute and chronic respiratory failure with hypoxia: Secondary | ICD-10-CM | POA: Diagnosis present

## 2021-10-26 DIAGNOSIS — Z820 Family history of epilepsy and other diseases of the nervous system: Secondary | ICD-10-CM

## 2021-10-26 DIAGNOSIS — Z931 Gastrostomy status: Secondary | ICD-10-CM | POA: Diagnosis not present

## 2021-10-26 DIAGNOSIS — I5023 Acute on chronic systolic (congestive) heart failure: Secondary | ICD-10-CM | POA: Diagnosis not present

## 2021-10-26 DIAGNOSIS — J449 Chronic obstructive pulmonary disease, unspecified: Secondary | ICD-10-CM | POA: Diagnosis not present

## 2021-10-26 DIAGNOSIS — E039 Hypothyroidism, unspecified: Secondary | ICD-10-CM | POA: Diagnosis present

## 2021-10-26 DIAGNOSIS — R569 Unspecified convulsions: Secondary | ICD-10-CM | POA: Diagnosis not present

## 2021-10-26 DIAGNOSIS — Z9911 Dependence on respirator [ventilator] status: Secondary | ICD-10-CM

## 2021-10-26 DIAGNOSIS — Z681 Body mass index (BMI) 19 or less, adult: Secondary | ICD-10-CM | POA: Diagnosis not present

## 2021-10-26 DIAGNOSIS — E43 Unspecified severe protein-calorie malnutrition: Secondary | ICD-10-CM | POA: Diagnosis present

## 2021-10-26 DIAGNOSIS — I2510 Atherosclerotic heart disease of native coronary artery without angina pectoris: Secondary | ICD-10-CM | POA: Diagnosis present

## 2021-10-26 DIAGNOSIS — Z808 Family history of malignant neoplasm of other organs or systems: Secondary | ICD-10-CM

## 2021-10-26 DIAGNOSIS — R339 Retention of urine, unspecified: Secondary | ICD-10-CM | POA: Diagnosis present

## 2021-10-26 DIAGNOSIS — Z430 Encounter for attention to tracheostomy: Secondary | ICD-10-CM | POA: Diagnosis not present

## 2021-10-26 DIAGNOSIS — G20A1 Parkinson's disease without dyskinesia, without mention of fluctuations: Secondary | ICD-10-CM | POA: Diagnosis present

## 2021-10-26 DIAGNOSIS — F419 Anxiety disorder, unspecified: Secondary | ICD-10-CM | POA: Diagnosis not present

## 2021-10-26 DIAGNOSIS — R627 Adult failure to thrive: Secondary | ICD-10-CM | POA: Diagnosis present

## 2021-10-26 DIAGNOSIS — Z825 Family history of asthma and other chronic lower respiratory diseases: Secondary | ICD-10-CM

## 2021-10-26 DIAGNOSIS — J9503 Malfunction of tracheostomy stoma: Secondary | ICD-10-CM | POA: Diagnosis not present

## 2021-10-26 DIAGNOSIS — G894 Chronic pain syndrome: Secondary | ICD-10-CM | POA: Diagnosis not present

## 2021-10-26 DIAGNOSIS — G4089 Other seizures: Secondary | ICD-10-CM | POA: Diagnosis not present

## 2021-10-26 DIAGNOSIS — Z431 Encounter for attention to gastrostomy: Secondary | ICD-10-CM | POA: Diagnosis not present

## 2021-10-26 DIAGNOSIS — J189 Pneumonia, unspecified organism: Secondary | ICD-10-CM | POA: Diagnosis present

## 2021-10-26 DIAGNOSIS — Z823 Family history of stroke: Secondary | ICD-10-CM

## 2021-10-26 DIAGNOSIS — E878 Other disorders of electrolyte and fluid balance, not elsewhere classified: Secondary | ICD-10-CM | POA: Diagnosis present

## 2021-10-26 DIAGNOSIS — M8580 Other specified disorders of bone density and structure, unspecified site: Secondary | ICD-10-CM | POA: Diagnosis present

## 2021-10-26 DIAGNOSIS — Z79899 Other long term (current) drug therapy: Secondary | ICD-10-CM

## 2021-10-26 DIAGNOSIS — J440 Chronic obstructive pulmonary disease with acute lower respiratory infection: Secondary | ICD-10-CM | POA: Diagnosis present

## 2021-10-26 DIAGNOSIS — J9690 Respiratory failure, unspecified, unspecified whether with hypoxia or hypercapnia: Secondary | ICD-10-CM | POA: Diagnosis not present

## 2021-10-26 DIAGNOSIS — D638 Anemia in other chronic diseases classified elsewhere: Secondary | ICD-10-CM | POA: Diagnosis present

## 2021-10-26 DIAGNOSIS — B961 Klebsiella pneumoniae [K. pneumoniae] as the cause of diseases classified elsewhere: Secondary | ICD-10-CM | POA: Diagnosis present

## 2021-10-26 DIAGNOSIS — G4733 Obstructive sleep apnea (adult) (pediatric): Secondary | ICD-10-CM | POA: Diagnosis not present

## 2021-10-26 DIAGNOSIS — I82402 Acute embolism and thrombosis of unspecified deep veins of left lower extremity: Secondary | ICD-10-CM | POA: Diagnosis not present

## 2021-10-26 DIAGNOSIS — Z888 Allergy status to other drugs, medicaments and biological substances status: Secondary | ICD-10-CM

## 2021-10-26 DIAGNOSIS — G250 Essential tremor: Secondary | ICD-10-CM | POA: Diagnosis present

## 2021-10-26 DIAGNOSIS — Z8249 Family history of ischemic heart disease and other diseases of the circulatory system: Secondary | ICD-10-CM

## 2021-10-26 LAB — CBC
HCT: 37.8 % — ABNORMAL LOW (ref 39.0–52.0)
Hemoglobin: 11 g/dL — ABNORMAL LOW (ref 13.0–17.0)
MCH: 26.9 pg (ref 26.0–34.0)
MCHC: 29.1 g/dL — ABNORMAL LOW (ref 30.0–36.0)
MCV: 92.4 fL (ref 80.0–100.0)
Platelets: 403 10*3/uL — ABNORMAL HIGH (ref 150–400)
RBC: 4.09 MIL/uL — ABNORMAL LOW (ref 4.22–5.81)
RDW: 17 % — ABNORMAL HIGH (ref 11.5–15.5)
WBC: 13.9 10*3/uL — ABNORMAL HIGH (ref 4.0–10.5)
nRBC: 0 % (ref 0.0–0.2)

## 2021-10-26 LAB — PROCALCITONIN: Procalcitonin: 0.28 ng/mL

## 2021-10-26 LAB — PROTIME-INR
INR: 1.2 (ref 0.8–1.2)
Prothrombin Time: 15 seconds (ref 11.4–15.2)

## 2021-10-26 LAB — COMPREHENSIVE METABOLIC PANEL
ALT: 94 U/L — ABNORMAL HIGH (ref 0–44)
AST: 37 U/L (ref 15–41)
Albumin: 3.1 g/dL — ABNORMAL LOW (ref 3.5–5.0)
Alkaline Phosphatase: 184 U/L — ABNORMAL HIGH (ref 38–126)
Anion gap: 6 (ref 5–15)
BUN: 22 mg/dL (ref 8–23)
CO2: 40 mmol/L — ABNORMAL HIGH (ref 22–32)
Calcium: 9.5 mg/dL (ref 8.9–10.3)
Chloride: 93 mmol/L — ABNORMAL LOW (ref 98–111)
Creatinine, Ser: 0.48 mg/dL — ABNORMAL LOW (ref 0.61–1.24)
GFR, Estimated: >60 mL/min (ref >60)
Glucose, Bld: 156 mg/dL — ABNORMAL HIGH (ref 70–99)
Potassium: 5.5 mmol/L — ABNORMAL HIGH (ref 3.5–5.1)
Sodium: 139 mmol/L (ref 135–145)
Total Bilirubin: 0.6 mg/dL (ref 0.3–1.2)
Total Protein: 8.1 g/dL (ref 6.5–8.1)

## 2021-10-26 LAB — POCT I-STAT 7, (LYTES, BLD GAS, ICA,H+H)
Acid-Base Excess: 12 mmol/L — ABNORMAL HIGH (ref 0.0–2.0)
Bicarbonate: 35.2 mmol/L — ABNORMAL HIGH (ref 20.0–28.0)
Calcium, Ion: 1.21 mmol/L (ref 1.15–1.40)
HCT: 33 % — ABNORMAL LOW (ref 39.0–52.0)
Hemoglobin: 11.2 g/dL — ABNORMAL LOW (ref 13.0–17.0)
O2 Saturation: 100 %
Patient temperature: 99.1
Potassium: 3.4 mmol/L — ABNORMAL LOW (ref 3.5–5.1)
Sodium: 139 mmol/L (ref 135–145)
TCO2: 36 mmol/L — ABNORMAL HIGH (ref 22–32)
pCO2 arterial: 39.9 mmHg (ref 32–48)
pH, Arterial: 7.555 — ABNORMAL HIGH (ref 7.35–7.45)
pO2, Arterial: 164 mmHg — ABNORMAL HIGH (ref 83–108)

## 2021-10-26 LAB — GLUCOSE, CAPILLARY
Glucose-Capillary: 123 mg/dL — ABNORMAL HIGH (ref 70–99)
Glucose-Capillary: 139 mg/dL — ABNORMAL HIGH (ref 70–99)
Glucose-Capillary: 114 mg/dL — ABNORMAL HIGH (ref 70–99)

## 2021-10-26 LAB — CREATININE, SERUM
Creatinine, Ser: 0.68 mg/dL (ref 0.61–1.24)
GFR, Estimated: >60 mL/min (ref >60)

## 2021-10-26 LAB — CBC WITH DIFFERENTIAL/PLATELET
Abs Immature Granulocytes: 0.14 10*3/uL — ABNORMAL HIGH (ref 0.00–0.07)
Basophils Absolute: 0 10*3/uL (ref 0.0–0.1)
Basophils Relative: 0 %
Eosinophils Absolute: 0 10*3/uL (ref 0.0–0.5)
Eosinophils Relative: 0 %
HCT: 41.8 % (ref 39.0–52.0)
Hemoglobin: 12 g/dL — ABNORMAL LOW (ref 13.0–17.0)
Immature Granulocytes: 1 %
Lymphocytes Relative: 1 %
Lymphs Abs: 0.3 10*3/uL — ABNORMAL LOW (ref 0.7–4.0)
MCH: 27.3 pg (ref 26.0–34.0)
MCHC: 28.7 g/dL — ABNORMAL LOW (ref 30.0–36.0)
MCV: 95.2 fL (ref 80.0–100.0)
Monocytes Absolute: 0.9 10*3/uL (ref 0.1–1.0)
Monocytes Relative: 4 %
Neutro Abs: 19.2 10*3/uL — ABNORMAL HIGH (ref 1.7–7.7)
Neutrophils Relative %: 94 %
Platelets: 385 10*3/uL (ref 150–400)
RBC: 4.39 MIL/uL (ref 4.22–5.81)
RDW: 17.2 % — ABNORMAL HIGH (ref 11.5–15.5)
WBC: 20.5 10*3/uL — ABNORMAL HIGH (ref 4.0–10.5)
nRBC: 0 % (ref 0.0–0.2)

## 2021-10-26 LAB — APTT: aPTT: 30 seconds (ref 24–36)

## 2021-10-26 LAB — LACTIC ACID, PLASMA: Lactic Acid, Venous: 3.9 mmol/L (ref 0.5–1.9)

## 2021-10-26 LAB — AMYLASE: Amylase: 60 U/L (ref 28–100)

## 2021-10-26 LAB — MAGNESIUM: Magnesium: 2.1 mg/dL (ref 1.7–2.4)

## 2021-10-26 LAB — PHOSPHORUS: Phosphorus: <1 mg/dL — CL (ref 2.5–4.6)

## 2021-10-26 MED ORDER — PANTOPRAZOLE SODIUM 40 MG PO TBEC: 40.0000 mg | DELAYED_RELEASE_TABLET | Freq: Every day | ORAL | Status: DC

## 2021-10-26 MED ORDER — SODIUM PHOSPHATES 45 MMOLE/15ML IV SOLN: 30.0000 mmol | Freq: Once | INTRAVENOUS | Status: DC

## 2021-10-26 MED ORDER — ALPRAZOLAM 0.25 MG PO TABS
0.5000 mg | ORAL_TABLET | Freq: Three times a day (TID) | ORAL | Status: DC | PRN
  Administered 2021-10-27 – 2021-11-01 (×10): 0.5 mg via ORAL
  Filled 2021-10-26 (×10): qty 2

## 2021-10-26 MED ORDER — SODIUM CHLORIDE 0.9 % IV SOLN: INTRAVENOUS | Status: DC

## 2021-10-26 MED ORDER — HYDROXYZINE HCL 25 MG PO TABS
25.0000 mg | ORAL_TABLET | Freq: Four times a day (QID) | ORAL | Status: DC | PRN
  Administered 2021-10-28 – 2021-10-29 (×2): 25 mg via ORAL
  Filled 2021-10-26 (×2): qty 1

## 2021-10-26 MED ORDER — LACTATED RINGERS IV SOLN: INTRAVENOUS | Status: DC

## 2021-10-26 MED ORDER — LACTATED RINGERS IV BOLUS: 1000.0000 mL | Freq: Once | INTRAVENOUS | Status: DC

## 2021-10-26 MED ORDER — SODIUM CHLORIDE 0.9 % IV SOLN
1.0000 g | Freq: Once | INTRAVENOUS | Status: AC
  Administered 2021-10-26: 1 g via INTRAVENOUS
  Filled 2021-10-26: qty 10

## 2021-10-26 MED ORDER — SODIUM PHOSPHATES 45 MMOLE/15ML IV SOLN
30.0000 mmol | Freq: Four times a day (QID) | INTRAVENOUS | Status: AC
  Administered 2021-10-26 – 2021-10-27 (×2): 30 mmol via INTRAVENOUS
  Filled 2021-10-26 (×2): qty 10

## 2021-10-26 MED ORDER — ORAL CARE MOUTH RINSE: 15.0000 mL | OROMUCOSAL | Status: DC | PRN

## 2021-10-26 MED ORDER — SODIUM CHLORIDE 0.9 % IV SOLN
500.0000 mg | Freq: Once | INTRAVENOUS | Status: AC
  Administered 2021-10-26: 500 mg via INTRAVENOUS
  Filled 2021-10-26: qty 5

## 2021-10-26 MED ORDER — DOCUSATE SODIUM 100 MG PO CAPS: 100.0000 mg | ORAL_CAPSULE | Freq: Two times a day (BID) | ORAL | Status: DC | PRN

## 2021-10-26 MED ORDER — MIDODRINE HCL 5 MG PO TABS
10.0000 mg | ORAL_TABLET | Freq: Three times a day (TID) | ORAL | Status: DC
  Administered 2021-10-26 – 2021-10-30 (×8): 10 mg via ORAL
  Filled 2021-10-26 (×11): qty 2

## 2021-10-26 MED ORDER — OSMOLITE 1.5 CAL PO LIQD
1000.0000 mL | ORAL | Status: DC
  Administered 2021-10-26 – 2021-10-31 (×8): 1000 mL
  Filled 2021-10-26 (×5): qty 1000

## 2021-10-26 MED ORDER — HEPARIN SODIUM (PORCINE) 5000 UNIT/ML IJ SOLN
5000.0000 [IU] | Freq: Three times a day (TID) | INTRAMUSCULAR | Status: DC
  Administered 2021-10-26 – 2021-11-01 (×18): 5000 [IU] via SUBCUTANEOUS
  Filled 2021-10-26 (×18): qty 1

## 2021-10-26 MED ORDER — DEXTROSE-NACL 5-0.45 % IV SOLN: INTRAVENOUS | Status: DC

## 2021-10-26 MED ORDER — SODIUM CHLORIDE 0.9 % IV BOLUS
1000.0000 mL | Freq: Once | INTRAVENOUS | Status: AC
  Administered 2021-10-26: 1000 mL via INTRAVENOUS

## 2021-10-26 MED ORDER — ACETAMINOPHEN 160 MG/5ML PO SOLN
650.0000 mg | ORAL | Status: DC | PRN
  Administered 2021-10-26 – 2021-11-01 (×4): 650 mg
  Filled 2021-10-26 (×4): qty 20.3

## 2021-10-26 MED ORDER — JEVITY 1.5 CAL/FIBER PO LIQD
1000.0000 mL | ORAL | Status: DC
  Filled 2021-10-26: qty 1000

## 2021-10-26 MED ORDER — CHLORHEXIDINE GLUCONATE CLOTH 2 % EX PADS
6.0000 | MEDICATED_PAD | Freq: Every day | CUTANEOUS | Status: DC
  Administered 2021-10-26 – 2021-11-01 (×7): 6 via TOPICAL

## 2021-10-26 MED ORDER — VANCOMYCIN HCL IN DEXTROSE 1-5 GM/200ML-% IV SOLN
1000.0000 mg | Freq: Two times a day (BID) | INTRAVENOUS | Status: DC
  Administered 2021-10-26 – 2021-10-27 (×2): 1000 mg via INTRAVENOUS
  Filled 2021-10-26 (×2): qty 200

## 2021-10-26 MED ORDER — ORAL CARE MOUTH RINSE
15.0000 mL | OROMUCOSAL | Status: DC
  Administered 2021-10-26 – 2021-11-01 (×68): 15 mL via OROMUCOSAL

## 2021-10-26 MED ORDER — LEVOTHYROXINE SODIUM 25 MCG PO TABS
25.0000 ug | ORAL_TABLET | Freq: Every day | ORAL | Status: DC
  Administered 2021-10-27 – 2021-10-28 (×2): 25 ug via ORAL
  Filled 2021-10-26 (×2): qty 1

## 2021-10-26 MED ORDER — SODIUM CHLORIDE 0.9 % IV SOLN
2.0000 g | Freq: Three times a day (TID) | INTRAVENOUS | Status: DC
  Administered 2021-10-26 – 2021-11-01 (×19): 2 g via INTRAVENOUS
  Filled 2021-10-26 (×19): qty 12.5

## 2021-10-26 MED ORDER — PANTOPRAZOLE 2 MG/ML SUSPENSION
40.0000 mg | Freq: Every day | ORAL | Status: DC
  Administered 2021-10-26 – 2021-11-01 (×7): 40 mg
  Filled 2021-10-26 (×7): qty 20

## 2021-10-26 MED ORDER — LORAZEPAM 2 MG/ML IJ SOLN
0.5000 mg | INTRAMUSCULAR | Status: DC | PRN
  Administered 2021-10-29: 1 mg via INTRAVENOUS
  Filled 2021-10-26 (×2): qty 1

## 2021-10-26 MED ORDER — ALBUTEROL SULFATE (2.5 MG/3ML) 0.083% IN NEBU
INHALATION_SOLUTION | RESPIRATORY_TRACT | Status: AC
  Administered 2021-10-26: 5 mg
  Filled 2021-10-26: qty 6

## 2021-10-26 MED ORDER — PROSOURCE TF20 ENFIT COMPATIBL EN LIQD
60.0000 mL | Freq: Every day | ENTERAL | Status: DC
  Administered 2021-10-26 – 2021-11-01 (×7): 60 mL
  Filled 2021-10-26 (×9): qty 60

## 2021-10-26 MED ORDER — LEVETIRACETAM 100 MG/ML PO SOLN
1500.0000 mg | Freq: Two times a day (BID) | ORAL | Status: DC
  Administered 2021-10-26 – 2021-11-01 (×13): 1500 mg
  Filled 2021-10-26 (×15): qty 15

## 2021-10-26 MED ORDER — POLYETHYLENE GLYCOL 3350 17 G PO PACK: 17.0000 g | PACK | Freq: Every day | ORAL | Status: DC | PRN

## 2021-10-26 NOTE — Progress Notes (Addendum)
[facility] Progress Note Patient Name: Austin Davis DOB: [birth year] MRN: [id number]   Date of Service  10/26/2021  HPI/Events of Note  Mutiple issues: 1. Nursing reports that chronic PEG tube site is leaking light green bile-like material. The area is red and tender to palpation. 2. Watery stools - Nursing request for Flexiseal.   eICU Interventions  Plan: Abdominal Xray STAT. Place Flexiseal.      Intervention Category Major Interventions: Other:  Lysle Dingwall 10/26/2021, 9:40 PM

## 2021-10-26 NOTE — Progress Notes (Signed)
Patient was transported from ED to room 3M07, on ventilator, without complications.

## 2021-10-26 NOTE — H&P (Signed)
NAME:  Austin Davis, MRN:  [id number], DOB:  [birth year], LOS: 0 ADMISSION DATE:  10/26/2021, CONSULTATION DATE: 10/26/2021 REFERRING MD: Emergency department physician, CHIEF COMPLAINT: Acute on chronic hypercarbic respiratory failure  History of Present Illness:  Austin Davis is a 81 year old male resident of a skilled nursing facility [facility] is trach dependent but not normally ventilator dependent.  He has been admitted to [facility] hospital before for trach dislodgment and this appears to be what happened today.  When he arrived in the emergency room the respiratory therapist advance to trach and the ventilator shows proper volume return from delivery with good air movement and equal bilateral breath sounds.  He has been placed on a ventilator for his chronic and now acute hypercarbic respiratory failure.  Questionable pneumonia by x-ray and since he is a nursing home resident we will change his antibiotics to vancomycin and cefepime.  We will admit him to the intensive care unit and try to liberate him from mechanical ventilator support and return to [facility] in a timely manner.  He has an extensive past medical history that is well-documented below.  Pertinent  Medical History   Past Medical History:  Diagnosis Date   CHF (congestive heart failure) ([place])    Coronary artery disease    Essential tremor    GI bleed 2019   hospitalized at [facility] for one week   Headache    since childhood   Low blood sugar    since childhood, controlled by diet   Mitral regurgitation    Mitral valve prolapse    Osteopenia 2021   Paroxysmal atrial fibrillation ([place])    Tremor      Significant Hospital Events: Including procedures, antibiotic start and stop dates in addition to other pertinent events     Interim History / Subjective:  Resident at [facility] not normally on ventilatory support but is on trach  Objective   Blood pressure 109/72, pulse 93, temperature 98.3  F (36.8 C), temperature source Oral, resp. rate 18, height 5\' 10"  (1.778 m), weight 55 kg, SpO2 100 %.    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  Presents with tracheostomy dislodgment elevated PCO2 decreased pH. Full mechanical ventilatory support Wean FiO2 as needed Questionable component of hospital-acquired pneumonia we will change antibiotics to vancomycin and cefepime Pulmonary toilet Admit to the intensive care unit  Generalized failure to thrive Reevaluate status  Parkinson disease Continue Inderal  Seizure disorder Continue benzodiazepine  Hypothyroidism Continue Synthroid 25 mcg tablet per PEG tube  Chronic hypotension Continue midodrine  Malnutrition Continue tube feedings  Chronic anxiety Continue Xanax as needed  Best Practice (right click and "Reselect all SmartList Selections" daily)  Diet/type: tubefeeds DVT prophylaxis: prophylactic heparin  GI prophylaxis: PPI Lines: N/A Foley:  N/A Code Status:  full code  Last date of multidisciplinary goals of care discussion [tbd]  Labs   CBC: Recent Labs  Lab 10/22/21 0837 10/26/21 0819  WBC 12.7* 20.5*  NEUTROABS  --  19.2*  HGB 11.3* 12.0*  HCT 36.9*  41.8  MCV 89.3 95.2  PLT 367 400    Basic Metabolic Panel: Recent Labs  Lab 10/22/21 0837 10/26/21 0819  NA 139 139  K 5.1 5.5*  CL 96* 93*  CO2 34* 40*  GLUCOSE 164* 156*  BUN 20 22  CREATININE 0.49* 0.48*  CALCIUM 9.5 9.5   GFR: Estimated Creatinine Clearance: 57.3 mL/min (A) (by C-G formula based on SCr of 0.48 mg/dL (L)). Recent Labs  Lab 10/22/21 0837 10/26/21 0819  WBC 12.7* 20.5*    Liver Function Tests: Recent Labs  Lab 10/26/21 0819  AST 37  ALT 94*  ALKPHOS 184*  BILITOT 0.6  PROT 8.1  ALBUMIN 3.1*   No results for input(s): "LIPASE", "AMYLASE" in the last 168 hours. No results for input(s): "AMMONIA" in the last 168 hours.  ABG    Component Value Date/Time   PHART 7.450 08/03/2021 2047   PCO2ART 38.1 08/03/2021 2047   PO2ART 52 (L) 08/03/2021 2047   HCO3 26.5 08/03/2021 2047   TCO2 28 08/03/2021 2047   ACIDBASEDEF 2.0 05/30/2021 0052   O2SAT 88 08/03/2021 2047     Coagulation Profile: No results for input(s): "INR", "PROTIME" in the last 168 hours.  Cardiac Enzymes: No results for input(s): "CKTOTAL", "CKMB", "CKMBINDEX", "TROPONINI" in the last 168 hours.  HbA1C: Hgb A1c MFr Bld  Date/Time Value Ref Range Status  04/11/2021 09:12 PM 5.5 4.8 - 5.6 % Final    Comment:    (NOTE) Pre diabetes:          5.7%-6.4%  Diabetes:              >6.4%  Glycemic control for   <7.0% adults with diabetes   10/02/2020 04:46 AM 6.1 (H) 4.8 - 5.6 % Final    Comment:    (NOTE) Pre diabetes:          5.7%-6.4%  Diabetes:              >6.4%  Glycemic control for   <7.0% adults with diabetes     CBG: No results for input(s): "GLUCAP" in the last 168 hours.  Review of Systems:   na  Past Medical History:  He,  has a past medical history of CHF (congestive heart failure) ([place]), Coronary artery disease, Essential tremor, GI bleed (2019), Headache, Low blood sugar, Mitral regurgitation, Mitral valve prolapse, Osteopenia (2021), Paroxysmal  atrial fibrillation ([place]), and Tremor.   Surgical History:   Past Surgical History:  Procedure Laterality Date   COLONOSCOPY WITH PROPOFOL N/A 02/19/2017   Procedure: COLONOSCOPY WITH PROPOFOL;  Surgeon: Wilford Corner, MD;  Location: [facility] ENDOSCOPY;  Service: Endoscopy;  Laterality: N/A;   IR GASTROSTOMY TUBE MOD SED  10/18/2020   IR IVC FILTER PLMT / S&I /IMG GUID/MOD SED  11/01/2020   laser eye surgery for retina detachment     MITRAL VALVE REPAIR  01/2003   monitor  02/05/2006   polyp removal     TONSILLECTOMY     tooth removal     as a teenager   TRACHEOSTOMY TUBE PLACEMENT N/A 10/26/2020   Procedure: TRACHEOSTOMY;  Surgeon: Rozetta Nunnery, MD;  Location: [facility] OR;  Service: ENT;  Laterality: N/A;     Social History:   reports that he has never smoked. He has never used smokeless tobacco. He reports current alcohol use. He reports that he does not use drugs.   Family History:  His family history includes Asthma in his daughter; CAD in his mother; Epilepsy in his son; Heart disease in his mother; Heart failure in his father, maternal grandmother, and paternal grandmother; Migraines in his mother; Pneumonia in his maternal grandfather; Skin cancer in his father; Stroke in his paternal grandfather; Tremor in his father; Valvular heart disease in his father. There is no history of Parkinson's disease.   Allergies Allergies  Allergen Reactions   Prednisone Other (See Comments)    Listed as allergy on MAR Unknown reaction   Cortisone Other (See Comments)    Listed as allergy per MAR Unknown reaction     Home Medications  Prior to Admission medications   Medication Sig Start Date End Date Taking? Authorizing Provider  acetaminophen (TYLENOL) 325 MG tablet Take 650 mg by mouth every 8 (eight) hours as needed for mild pain or fever.    [provider]  ALPRAZolam Duanne Moron) 0.5 MG tablet Place 0.5 mg into feeding tube every 8 (eight) hours as needed for anxiety.     [provider]  bethanechol (URECHOLINE) 10 MG tablet Take 1 tablet (10 mg total) by mouth 3 (three) times daily. Patient not taking: Reported on 08/30/2021 06/26/21   Mercy Riding, MD  diazePAM (VALTOCO 5 MG DOSE) 5 MG/0.1ML LIQD Give 15 mg (0.48ml) intranasal for seizure lasting more than 5 minutes can repeat once after 4 hours.  Do not use more than twice in 24 hours. Patient not taking: Reported on 08/30/2021 06/26/21   Mercy Riding, MD  esomeprazole (NEXIUM) 40 MG capsule Take 40 mg by mouth daily. Via feeding tube    [provider]  fexofenadine (ALLEGRA) 60 MG tablet Take 60 mg by mouth daily.    [provider]  fluticasone (FLONASE) 50 MCG/ACT nasal spray Place 2 sprays into both nostrils in the morning and at bedtime.    [provider]  folic acid (FOLVITE) 1 MG tablet Take 1 tablet (1 mg total) by mouth daily. Patient taking differently: Place 1 mg into feeding tube daily. 09/29/20   [name], MD  furosemide (LASIX) 20 MG tablet Place 1 tablet (20 mg total) into feeding tube daily as needed for fluid or edema. Patient taking differently: Place 20 mg into feeding tube daily. 07/03/21   Mercy Riding, MD  GERI-TUSSIN 100 MG/5ML liquid Place 400 mg into feeding tube 3 (three) times daily.    [provider]  hydrOXYzine (ATARAX) 25 MG tablet Place 25 mg into feeding tube every 6 (six) hours as needed for anxiety.    [provider]  lactulose (CHRONULAC) 10 GM/15ML solution Place 20 g into feeding tube every 12 (twelve) hours as needed (constipation).    [provider]  levalbuterol Penne Lash) 1.25 MG/3ML nebulizer solution Take 1.25 mg by nebulization every 6 (six) hours.    [provider]  levETIRAcetam (KEPPRA) 100 MG/ML solution Place 1,500 mg into feeding tube in the morning and at bedtime.    [provider]  levothyroxine (SYNTHROID) 25 MCG tablet Place 25 mcg into feeding tube daily.     [provider]  LORazepam (ATIVAN) 2 MG/ML injection 2 mg as needed (seizures).    [provider]  midodrine (PROAMATINE) 10 MG tablet Place 1 tablet (10 mg total) into feeding tube 3 (three) times daily with meals. Patient taking differently: Place 10 mg into feeding tube 3 (three) times daily. 05/01/21   Thurnell Lose, MD  Multiple Vitamin (MULTIVITAMIN WITH MINERALS) TABS tablet Place 1 tablet into feeding tube daily. 05/11/21   Raiford Noble Latif, DO  Nutritional Supplements (FEEDING SUPPLEMENT, JEVITY 1.5 CAL/FIBER,) LIQD Place 1,000 mLs into feeding tube continuous. Patient not taking: Reported on 08/30/2021 05/10/21   Raiford Noble Latif, DO  Nutritional Supplements (FEEDING SUPPLEMENT, OSMOLITE 1.5 CAL,) LIQD Place 55 mL/hr into feeding tube See admin instructions. 55 ml/hr for 22-24 hours = 1815-1980 kcal & 1520-1603 ml    [provider]  Omega-3 Fatty Acids (FISH OIL) 1000 MG CAPS Take 1,000 mg by mouth daily.    [provider]  ondansetron (ZOFRAN-ODT) 4 MG disintegrating tablet 4 mg See admin instructions. 4 mg, per tube, every six hours as needed for nausea with vomiting    [provider]  polyethylene glycol (MIRALAX / GLYCOLAX) 17 g packet Place 17 g into feeding tube every other day.    [provider]  Probiotic Product (CULTURELLE PROBIOTICS PO) Give 1 capsule by tube daily.    [provider]  propranolol (INDERAL) 10 MG tablet Place 1 tablet (10 mg total) into feeding tube daily as needed (for tremors in AM. do not administer if heart rate is below 60 or systolic blood pressure is below 90.). Patient taking differently: Place 10 mg into feeding tube See admin instructions. 10 mg once daly as needed for tremors, HOLD if heart rate is below 60 or systolic blood pressure is below 90. 05/01/21   Singh, Margaree Mackintosh, MD  sertraline (ZOLOFT) 25 MG tablet Place 25 mg into feeding tube daily.    [provider]   Soap & Cleansers (CETAPHIL GENTLE CLEANSER EX) Apply 10 mLs topically daily.    [provider]  tamsulosin (FLOMAX) 0.4 MG CAPS capsule Take 0.4 mg by mouth daily. Via feeding tube    [provider]  Water For Irrigation, Sterile (STERILE WATER FOR IRRIGATION) Irrigate with 100 mLs as directed See admin instructions. 100 ml H2O flush every 4 hours    [provider]     Critical care time: 45 min    Richardson Landry Ambar Raphael ACNP Acute Care Nurse Practitioner [place] Please consult Amion 10/26/2021, 12:15 PM

## 2021-10-26 NOTE — ED Provider Notes (Signed)
[facility] EMERGENCY DEPARTMENT Provider Note  CSN: [id number] Arrival date & time: 10/26/21 2423  Chief Complaint(s) Seizures  HPI Austin Davis is a 81 y.o. 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  Patient currently lives at [facility] but not on the ICU side and patient had a general tonic-clonic seizure this morning that self aborted as the nursing staff at [facility] does not have as needed Ativan for administration in the scenario.  He has been seen multiple times in the emergency department for similar presentation and was found to have increased oxygen requirements by EMS briefly but currently is not displaying any evidence of hypoxia on his home vent settings.  Additional history unable to be obtained as patient is nonverbal.   Past Medical History Past Medical History:  Diagnosis Date   CHF (congestive heart failure) ([place])    Coronary artery disease    Essential tremor    GI bleed 2019   hospitalized at [facility] for one week   Headache    since childhood   Low blood sugar    since childhood, controlled by diet   Mitral regurgitation    Mitral valve prolapse    Osteopenia 2021   Paroxysmal atrial fibrillation (HCC)    Tremor    Patient Active Problem List   Diagnosis Date Noted   Seizures ([place]) 06/26/2021   Tremor    Sepsis ([place]) 06/25/2021   Cardiac arrest due to respiratory disorder ([place]) 06/07/2021   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   Gastrostomy tube dependent ([place]) 06/04/2021   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   Impaired quality of life 05/30/2021   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 05/15/2021   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[place]) 05/08/2021   Pneumonia  05/08/2021   Urinary tract infection associated with indwelling urethral catheter ([place]) 05/08/2021   Encephalopathy 05/08/2021   Pressure injury of skin 04/30/2021   Seizure disorder ([place]) 04/29/2021   Pneumothorax 04/11/2021   Ventilator dependent ([place])    Aspiration pneumonia ([place]) 03/25/2021   Parkinsonism 03/25/2021   Obstructive sleep apnea 10/24/2020   Chronic obstructive pulmonary disease ([place]) 10/24/2020   Chronic heart failure with preserved ejection fraction ([place]) 10/24/2020   Unintentional weight loss 09/20/2020   SVT (supraventricular tachycardia) 09/20/2020   Protein-calorie malnutrition, severe 09/17/2020   Acute on chronic respiratory failure with hypoxia and hypercapnia ([place]) 09/16/2020   Failure to thrive in adult 09/16/2020   Stage 1 skin ulcer of sacral region ([place]) 09/16/2020   Chronic hypotension 06/30/2020   History of GI bleed 02/17/2017   Rectal bleeding 02/16/2017   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 Authorizing Provider  acetaminophen (TYLENOL) 325 MG tablet Take 650 mg by mouth every 8 (eight) hours as needed for mild pain or fever.    [provider]  ALPRAZolam Duanne Moron) 0.5 MG tablet Place 0.5 mg into feeding tube every 8 (eight) hours as needed for anxiety.    [provider]  bethanechol (URECHOLINE) 10 MG tablet Take 1 tablet (10 mg total) by mouth 3 (three) times daily. Patient not taking: Reported on 08/30/2021 06/26/21   Mercy Riding, MD  diazePAM (VALTOCO 5 MG DOSE) 5 MG/0.1ML LIQD Give 15 mg (0.24m) intranasal for seizure lasting more than 5 minutes can repeat once after 4 hours.  Do  not use more than twice in 24 hours. Patient not taking: Reported on 08/30/2021 06/26/21   GMercy Riding MD  esomeprazole (NEXIUM) 40 MG capsule Take 40 mg by mouth daily. Via feeding tube    [provider]  fexofenadine (ALLEGRA) 60 MG tablet Take 60 mg by mouth daily.    [provider]  fluticasone (FLONASE) 50 MCG/ACT nasal spray Place 2 sprays into both nostrils in the morning and at bedtime.    [provider]  folic acid (FOLVITE) 1 MG tablet Take 1 tablet (1 mg total) by mouth daily. Patient taking differently: Place 1 mg into feeding tube daily. 09/29/20   LLittle Ishikawa MD  furosemide (LASIX) 20 MG tablet Place 1 tablet (20 mg total) into feeding tube daily as needed for fluid or edema. Patient taking differently: Place 20 mg into feeding tube daily. 07/03/21   GMercy Riding MD  GERI-TUSSIN 100 MG/5ML liquid Place 400 mg into feeding tube 3 (three) times daily.    [provider]  hydrOXYzine (ATARAX) 25 MG tablet Place 25 mg into feeding tube every 6 (six) hours as needed for anxiety.    [provider]  lactulose (CHRONULAC) 10 GM/15ML solution Place 20 g into feeding tube every 12 (twelve) hours as needed (constipation).    [provider]  levalbuterol (P[place]ash 1.25 MG/3ML nebulizer solution Take 1.25 mg by nebulization every 6 (six) hours.    [provider]  levETIRAcetam (KEPPRA) 100 MG/ML solution Place 1,500 mg into feeding tube in the morning and at bedtime.    [provider]  levothyroxine (SYNTHROID) 25 MCG tablet Place 25 mcg into feeding tube daily.    [provider]  LORazepam (ATIVAN) 2 MG/ML injection 2 mg as needed (seizures).    [provider]  midodrine (PROAMATINE) 10 MG tablet Place 1 tablet (10 mg total) into feeding tube 3 (three) times daily with meals. Patient taking differently: Place 10 mg into feeding tube 3 (three) times daily. 05/01/21   SThurnell Lose MD   Multiple Vitamin (MULTIVITAMIN WITH MINERALS) TABS tablet Place 1 tablet into feeding tube daily. 05/11/21   SRaiford NobleLatif, DO  Nutritional Supplements (FEEDING SUPPLEMENT, JEVITY 1.5 CAL/FIBER,) LIQD Place 1,000 mLs into feeding tube continuous. Patient not taking: Reported on 08/30/2021 05/10/21   SRaiford NobleLatif, DO  Nutritional Supplements (FEEDING SUPPLEMENT, OSMOLITE 1.5 CAL,) LIQD Place 55 mL/hr into feeding tube See admin instructions. 55 ml/hr for 22-24 hours = 1815-1980 kcal & 1520-1603 ml    [provider]  Omega-3 Fatty Acids (FISH OIL) 1000 MG CAPS Take 1,000 mg by mouth daily.    [provider]  ondansetron (ZOFRAN-ODT) 4 MG disintegrating tablet 4 mg See admin instructions. 4 mg, per tube, every six hours as needed for nausea with vomiting    [provider]  polyethylene glycol (MIRALAX / GLYCOLAX) 17 g packet Place 17 g into feeding tube every other day.    [provider]  Probiotic Product (CULTURELLE PROBIOTICS PO) Give 1 capsule by tube daily.    [provider]  propranolol (INDERAL) 10  MG tablet Place 1 tablet (10 mg total) into feeding tube daily as needed (for tremors in AM. do not administer if heart rate is below 60 or systolic blood pressure is below 90.). Patient taking differently: Place 10 mg into feeding tube See admin instructions. 10 mg once daly as needed for tremors, HOLD if heart rate is below 60 or systolic blood pressure is below 90. 05/01/21   Singh, Margaree Mackintosh, MD  sertraline (ZOLOFT) 25 MG tablet Place 25 mg into feeding tube daily.    [provider]  Soap & Cleansers (CETAPHIL GENTLE CLEANSER EX) Apply 10 mLs topically daily.    [provider]  tamsulosin (FLOMAX) 0.4 MG CAPS capsule Take 0.4 mg by mouth daily. Via feeding tube    [provider]  Water For Irrigation, Sterile (STERILE WATER FOR IRRIGATION) Irrigate with 100 mLs as directed See admin instructions. 100 ml H2O  flush every 4 hours    [provider]                                                                                                                                    Past Surgical History Past Surgical History:  Procedure Laterality Date   COLONOSCOPY WITH PROPOFOL N/A 02/19/2017   Procedure: COLONOSCOPY WITH PROPOFOL;  Surgeon: Wilford Corner, MD;  Location: [facility];  Service: Endoscopy;  Laterality: N/A;   IR GASTROSTOMY TUBE MOD SED  10/18/2020   IR IVC FILTER PLMT / S&I /IMG GUID/MOD SED  11/01/2020   laser eye surgery for retina detachment     MITRAL VALVE REPAIR  01/2003   monitor  02/05/2006   polyp removal     TONSILLECTOMY     tooth removal     as a teenager   TRACHEOSTOMY TUBE PLACEMENT N/A 10/26/2020   Procedure: TRACHEOSTOMY;  Surgeon: Rozetta Nunnery, MD;  Location: [facility];  Service: ENT;  Laterality: N/A;   Family History Family History  Problem Relation Age of Onset   Heart disease Mother    CAD Mother    Migraines Mother    Heart failure Father    Skin cancer Father    Valvular heart disease Father        prolapsed valve   Tremor Father        "probably had a bit of tremor in his old age"   Heart failure Maternal Grandmother    Pneumonia Maternal Grandfather    Heart failure Paternal Grandmother    Stroke Paternal Grandfather    Asthma Daughter    Epilepsy Son    Parkinson's disease Neg Hx     Social History Social History   Tobacco Use   Smoking status: Never   Smokeless tobacco: Never   Tobacco comments:    only tried a few cigarettes when he was younger  Substance Use Topics   Alcohol use: Yes    Comment: "probably a  6 pack of beer a year"   Drug use: No   Allergies Prednisone and Cortisone  Review of Systems Review of Systems  Unable to perform ROS: Patient nonverbal    Physical Exam Vital Signs  I have reviewed the triage vital signs BP 109/72   Pulse 93   Temp 98.3 F (36.8 C) (Oral)   Resp 18   Ht 5' 10"  (1.778 m)   Wt 55 kg   SpO2 100%   BMI 17.40 kg/m   Physical Exam Constitutional:      General: He is not in acute distress.    Appearance: Normal appearance. He is ill-appearing.  HENT:     Head: Normocephalic and atraumatic.     Nose: No congestion or rhinorrhea.  Eyes:     General:        Right eye: No discharge.        Left eye: No discharge.     Extraocular Movements: Extraocular movements intact.     Pupils: Pupils are equal, round, and reactive to light.  Cardiovascular:     Rate and Rhythm: Normal rate and regular rhythm.     Heart sounds: No murmur heard. Pulmonary:     Effort: No respiratory distress.     Breath sounds: Wheezing present. No rales.  Abdominal:     General: There is no distension.     Tenderness: There is no abdominal tenderness.  Musculoskeletal:        General: Normal range of motion.     Cervical back: Normal range of motion.  Skin:    General: Skin is warm and dry.  Neurological:     General: No focal deficit present.     Mental Status: He is alert.     ED Results and Treatments Labs (all labs ordered are listed, but only abnormal results are displayed) Labs Reviewed  COMPREHENSIVE METABOLIC PANEL - Abnormal; Notable for the following components:      Result Value   Potassium 5.5 (*)    Chloride 93 (*)    CO2 40 (*)    Glucose, Bld 156 (*)    Creatinine, Ser 0.48 (*)    Albumin 3.1 (*)    ALT 94 (*)    Alkaline Phosphatase 184 (*)    All other components within normal limits  CBC WITH DIFFERENTIAL/PLATELET - Abnormal; Notable for the following components:   WBC 20.5 (*)    Hemoglobin 12.0 (*)    MCHC 28.7 (*)    RDW 17.2 (*)    Neutro Abs 19.2 (*)    Lymphs Abs 0.3 (*)    Abs Immature Granulocytes 0.14 (*)    All other components within normal limits  BLOOD GAS, VENOUS                                                                                                                          Radiology CT Head Wo  Contrast  Result Date: 10/26/2021 CLINICAL  DATA:  81 year old male with seizure-like activity 0300 hours. Possible aspiration. EXAM: CT HEAD WITHOUT CONTRAST TECHNIQUE: Contiguous axial images were obtained from the base of the skull through the vertex without intravenous contrast. RADIATION DOSE REDUCTION: This exam was performed according to the departmental dose-optimization program which includes automated exposure control, adjustment of the mA and/or kV according to patient size and/or use of iterative reconstruction technique. COMPARISON:  Head CT 06/04/2021.  Brain MRI 04/30/2021. FINDINGS: Brain: Stable cerebral volume. Motion and streak artifact intermittently on today's exam. Allowing for this there is no midline shift, ventriculomegaly, mass effect, evidence of mass lesion, intracranial hemorrhage or evidence of cortically based acute infarction. Gray-white matter differentiation remains within normal limits. Vascular: Mild Calcified atherosclerosis at the skull base. No suspicious intracranial vascular hyperdensity. Skull: No acute osseous abnormality identified. Chronic TMJ degeneration. Sinuses/Orbits: Visualized paranasal sinuses and mastoids are stable and well aerated. Other: Visualized orbits and scalp soft tissues are within normal limits. IMPRESSION: Stable and negative noncontrast CT appearance of the brain when allowing for motion/streak artifact today. Electronically Signed   By: Genevie Ann M.D.   On: 10/26/2021 08:44   DG Chest Portable 1 View  Result Date: 10/26/2021 CLINICAL DATA:  81 year old male with seizure-like activity 0300 hours. Possible aspiration. EXAM: PORTABLE CHEST 1 VIEW COMPARISON:  Portable chest 06/25/2021 and earlier. FINDINGS: Portable AP supine view at 0819 hours. Chronic tracheostomy. Less rotated chest exam today. Stable lung volumes and mediastinal contours. Chronic sternotomy. Lung markings appear stable except at the medial left lung base where mild increased  reticulonodular opacity is noted. No confluent opacity. Osteopenia with chronic thoracic compression fractures. No acute osseous abnormality identified. Negative visible bowel gas. IMPRESSION: 1. Mildly increased medial left lung base opacity. Cannot exclude left lower lobe aspiration. Follow-up chest radiographs may be valuable. 2. Otherwise stable chest with chronic tracheostomy, sternotomy, thoracic compression fractures. Electronically Signed   By: Genevie Ann M.D.   On: 10/26/2021 08:42    Pertinent labs & imaging results that were available during my care of the patient were reviewed by me and considered in my medical decision making (see MDM for details).  Medications Ordered in ED Medications  albuterol (PROVENTIL) (2.5 MG/3ML) 0.083% nebulizer solution (5 mg  Given 10/26/21 0837)  cefTRIAXone (ROCEPHIN) 1 g in sodium chloride 0.9 % 100 mL IVPB (0 g Intravenous Stopped 10/26/21 1057)  azithromycin (ZITHROMAX) 500 mg in sodium chloride 0.9 % 250 mL IVPB (500 mg Intravenous New Bag/Given 10/26/21 1027)                                                                                                                                     Procedures .Critical Care  Performed by: Teressa Lower, MD Authorized by: Teressa Lower, MD   Critical care provider statement:    Critical care time (minutes):  30   Critical care was necessary to treat or prevent imminent or  life-threatening deterioration of the following conditions:  Respiratory failure   Critical care was time spent personally by me on the following activities:  Development of treatment plan with patient or surrogate, discussions with consultants, evaluation of patient's response to treatment, examination of patient, ordering and review of laboratory studies, ordering and review of radiographic studies, ordering and performing treatments and interventions, pulse oximetry, re-evaluation of patient's condition and review of old  charts   (including critical care time)  Medical Decision Making / ED Course   This patient presents to the ED for concern of seizure, hypoxia, this involves an extensive number of treatment options, and is a complaint that carries with it a high risk of complications and morbidity.  The differential diagnosis includes acute on chronic underlying seizure worsening, hypercarbia, electrolyte abnormality, aspiration pneumonia  MDM: Patient seen emergency room for evaluation of seizure and hypoxia.  Physical exam reveals wheezing bilaterally but patient is otherwise at his baseline on his home vent settings.  Laboratory evaluation with a leukocytosis to 20.5 with a Foley predominance, hemoglobin 12.0, potassium slightly elevated at 5.5, ALT 94, alk phos 184.  Chest x-ray shows a mild increase in the left lung base of an opacity concerning for worsening aspiration.  CT head unremarkable.  VBG obtained that shows a pH of 7.1 with a PCO2 that is undetectably high.  Patient's rate was increased and will require ICU management given possibly symptomatic hypercarbia with need for ventilatory management. Ceftriaxone Azithromycin initiated for patient's aspiration and patient admitted.   Additional history obtained:  -External records from outside source obtained and reviewed including: Chart review including previous notes, labs, imaging, consultation notes   Lab Tests: -I ordered, reviewed, and interpreted labs.   The pertinent results include:   Labs Reviewed  COMPREHENSIVE METABOLIC PANEL - Abnormal; Notable for the following components:      Result Value   Potassium 5.5 (*)    Chloride 93 (*)    CO2 40 (*)    Glucose, Bld 156 (*)    Creatinine, Ser 0.48 (*)    Albumin 3.1 (*)    ALT 94 (*)    Alkaline Phosphatase 184 (*)    All other components within normal limits  CBC WITH DIFFERENTIAL/PLATELET - Abnormal; Notable for the following components:   WBC 20.5 (*)    Hemoglobin 12.0 (*)     MCHC 28.7 (*)    RDW 17.2 (*)    Neutro Abs 19.2 (*)    Lymphs Abs 0.3 (*)    Abs Immature Granulocytes 0.14 (*)    All other components within normal limits  BLOOD GAS, VENOUS      Imaging Studies ordered: I ordered imaging studies including CT head, chest x-ray I independently visualized and interpreted imaging. I agree with the radiologist interpretation   Medicines ordered and prescription drug management: Meds ordered this encounter  Medications   albuterol (PROVENTIL) (2.5 MG/3ML) 0.083% nebulizer solution    Mel Almond, Brandy B: cabinet override   cefTRIAXone (ROCEPHIN) 1 g in sodium chloride 0.9 % 100 mL IVPB    Order Specific Question:   Antibiotic Indication:    Answer:   CAP   azithromycin (ZITHROMAX) 500 mg in sodium chloride 0.9 % 250 mL IVPB    -I have reviewed the patients home medicines and have made adjustments as needed  Critical interventions Vent management, antibiotics  Consultations Obtained: I requested consultation with the intensivists,  and discussed lab and imaging findings as well as pertinent plan -  they recommend: See admission   Cardiac Monitoring: The patient was maintained on a cardiac monitor.  I personally viewed and interpreted the cardiac monitored which showed an underlying rhythm of: NSR  Social Determinants of Health:  Factors impacting patients care include: Currently living in an LTAC   Reevaluation: After the interventions noted above, I reevaluated the patient and found that they have :improved  Co morbidities that complicate the patient evaluation  Past Medical History:  Diagnosis Date   CHF (congestive heart failure) ([place])    Coronary artery disease    Essential tremor    GI bleed 2019   hospitalized at [facility] for one week   Headache    since childhood   Low blood sugar    since childhood, controlled by diet   Mitral regurgitation    Mitral valve prolapse    Osteopenia 2021   Paroxysmal atrial fibrillation  ([place])    Tremor       Dispostion: I considered admission for this patient, and due to symptomatic hypercarbia requiring vent management and aspiration pneumonia, patient require hospital admission     Final Clinical Impression(s) / ED Diagnoses Final diagnoses:  None     _0 @    Teressa Lower, MD 10/26/21 1226

## 2021-10-26 NOTE — Progress Notes (Signed)
Pharmacy Antibiotic Note  Austin Davis is a 81 y.o. male admitted on 10/26/2021 with pneumonia.  Pharmacy has been consulted for Vanc and Cefepime dosing.  Vancomycin 1000 mg IV Q 12 hrs. Goal AUC 400-550. Expected AUC: 461 SCr used: 0.48   Plan: Vanc 1000 mg IV q12 hr Cefepime 2 gm IV q8hr Monitor renal function, clinical status, C&S and vanc levels as needed  Height: 5\' 10"  (177.8 cm) Weight: 55 kg (121 lb 4.1 oz) IBW/kg (Calculated) : 73  Temp (24hrs), Avg:100.1 F (37.8 C), Min:98.3 F (36.8 C), Max:101.8 F (38.8 C)  Recent Labs  Lab 10/22/21 0837 10/26/21 0819  WBC 12.7* 20.5*  CREATININE 0.49* 0.48*    Estimated Creatinine Clearance: 57.3 mL/min (A) (by C-G formula based on SCr of 0.48 mg/dL (L)).    Allergies  Allergen Reactions   Prednisone Other (See Comments)    Listed as allergy on MAR Unknown reaction   Cortisone Other (See Comments)    Listed as allergy per MAR Unknown reaction    Antimicrobials this admission: Cefep 10/7 >>  Vanc 10/7 >>   Thank you for allowing pharmacy to be a part of this patient's care.  Alanda Slim, PharmD, [facility] Clinical Pharmacist Please see AMION for all Pharmacists' Contact Phone Numbers 10/26/2021, 2:59 PM

## 2021-10-26 NOTE — Progress Notes (Signed)
Patient arrived, by [facility], on ventilator.  Patient was placed on our ventilator.  Once on our ventilator, patient's peak pressures were reading in the high 30s-low 40s and VT were in the 300s (patient's 8cc is 580).  Auscultated patient and noted to have diminished lung sounds with some faint expiratory wheezing.  Per EMS, patient was on PC at [facility], however once they arrived they began having complications with sats and tried to place patient on [facility] on their ventilator and noted patient to have cuff leak. They began to bag patient in order to ensure that patient was being ventilated for transport.  RT attempted PC however patient's volumes were in the 100s on those settings.  Switched patient to [facility] and started a 5mg  Albuterol treatment for the wheezing.  Upon further assessment of patient's trach, patient's trach was noted to be slightly out.  Deflated cuff and pushed trach back in and once cuff was reinflated patient began getting better return volumes and was more synchronous with the ventilator.  Patient was also transported to CT and back to room without complications.  MD aware of events.  Will continue to monitor.

## 2021-10-26 NOTE — ED Triage Notes (Signed)
Pt to ER via [facility] from [facility] with c/o seizure like activity that started around 3 AM.  Pt has significant history of same. Per [facility] pt was post ictal initally.  Pt arrives more alert, but not yet baseline.

## 2021-10-26 NOTE — Progress Notes (Signed)
Initial Nutrition Assessment  DOCUMENTATION CODES:   Underweight  INTERVENTION:   Tube Feeds via PEG: Osmolite 1.5 at 55 ml/hr (1320 mL/day) ProSource TF20 - daily Provides 2060 kcal, 103 gm protein, and 1003 mL free water daily.   NUTRITION DIAGNOSIS:   Inadequate oral intake related to inability to eat as evidenced by NPO status.  GOAL:   Patient will meet greater than or equal to 90% of their needs  MONITOR:   TF tolerance, Weight trends, I & O's, Labs, Vent status  REASON FOR ASSESSMENT:   Consult Enteral/tube feeding initiation and management  ASSESSMENT:   81 y.o. male presented to the ED from [facility] due to seizure like behavior and hypoxia. PMH includes trach dependent, s/p PEG, CHF, HLD, malnutrition, tremor, and COPD. Pt admitted with vent requirements after trach dislodgement.   RD working remotely at time of assessment. RD to reorder tube feeds, sent RN secure chat.   Patient is currently on ventilator support via trach. MV: 15.5 L/min Temp (24hrs), Avg:99.7 F (37.6 C), Min:98.3 F (36.8 C), Max:101.8 F (38.8 C)  Medications reviewed and include: Protonix, IV antibiotics Labs reviewed: Potassium 5.5   NUTRITION - FOCUSED PHYSICAL EXAM:  Deferred to follow-up.  Diet Order:   Diet Order     None       EDUCATION NEEDS:   Not appropriate for education at this time  Skin:  Skin Assessment: Reviewed RN Assessment  Last BM:  Unknown  Height:   Ht Readings from Last 1 Encounters:  10/26/21 5\' 10"  (1.778 m)    Weight:   Wt Readings from Last 1 Encounters:  10/26/21 55 kg    Ideal Body Weight:  75.5 kg  BMI:  Body mass index is 17.4 kg/m.  Estimated Nutritional Needs:   Kcal:  1900-2100  Protein:  95-115 grams  Fluid:  >/= 1.8 L   Hermina Barters RD, LDN Clinical Dietitian See [facility] for contact information.

## 2021-10-27 ENCOUNTER — Inpatient Hospital Stay (HOSPITAL_COMMUNITY): Payer: Medicare Other

## 2021-10-27 DIAGNOSIS — J9620 Acute and chronic respiratory failure, unspecified whether with hypoxia or hypercapnia: Secondary | ICD-10-CM | POA: Diagnosis not present

## 2021-10-27 LAB — GLUCOSE, CAPILLARY
Glucose-Capillary: 134 mg/dL — ABNORMAL HIGH (ref 70–99)
Glucose-Capillary: 119 mg/dL — ABNORMAL HIGH (ref 70–99)
Glucose-Capillary: 144 mg/dL — ABNORMAL HIGH (ref 70–99)
Glucose-Capillary: 124 mg/dL — ABNORMAL HIGH (ref 70–99)
Glucose-Capillary: 124 mg/dL — ABNORMAL HIGH (ref 70–99)
Glucose-Capillary: 133 mg/dL — ABNORMAL HIGH (ref 70–99)

## 2021-10-27 LAB — BASIC METABOLIC PANEL
Anion gap: 9 (ref 5–15)
BUN: 31 mg/dL — ABNORMAL HIGH (ref 8–23)
CO2: 31 mmol/L (ref 22–32)
Calcium: 8.5 mg/dL — ABNORMAL LOW (ref 8.9–10.3)
Chloride: 100 mmol/L (ref 98–111)
Creatinine, Ser: 0.49 mg/dL — ABNORMAL LOW (ref 0.61–1.24)
GFR, Estimated: >60 mL/min (ref >60)
Glucose, Bld: 124 mg/dL — ABNORMAL HIGH (ref 70–99)
Potassium: 3.1 mmol/L — ABNORMAL LOW (ref 3.5–5.1)
Sodium: 140 mmol/L (ref 135–145)

## 2021-10-27 LAB — BLOOD GAS, VENOUS
Acid-Base Excess: 15.9 mmol/L — ABNORMAL HIGH (ref 0.0–2.0)
Bicarbonate: 39.3 mmol/L — ABNORMAL HIGH (ref 20.0–28.0)
Drawn by: 164
O2 Saturation: 27.1 %
Patient temperature: 37
pCO2, Ven: 41 mmHg — ABNORMAL LOW (ref 44–60)
pH, Ven: 7.59 — ABNORMAL HIGH (ref 7.25–7.43)
pO2, Ven: <31 mmHg — CL (ref 32–45)

## 2021-10-27 LAB — BLOOD CULTURE ID PANEL (REFLEXED) - BCID2

## 2021-10-27 LAB — CBC
HCT: 30.6 % — ABNORMAL LOW (ref 39.0–52.0)
Hemoglobin: 9.7 g/dL — ABNORMAL LOW (ref 13.0–17.0)
MCH: 27.2 pg (ref 26.0–34.0)
MCHC: 31.7 g/dL (ref 30.0–36.0)
MCV: 86 fL (ref 80.0–100.0)
Platelets: 303 10*3/uL (ref 150–400)
RBC: 3.56 MIL/uL — ABNORMAL LOW (ref 4.22–5.81)
RDW: 18.1 % — ABNORMAL HIGH (ref 11.5–15.5)
WBC: 12.6 10*3/uL — ABNORMAL HIGH (ref 4.0–10.5)
nRBC: 0 % (ref 0.0–0.2)

## 2021-10-27 LAB — PHOSPHORUS
Phosphorus: 3.8 mg/dL (ref 2.5–4.6)
Phosphorus: 2.1 mg/dL — ABNORMAL LOW (ref 2.5–4.6)

## 2021-10-27 LAB — POTASSIUM: Potassium: 3.8 mmol/L (ref 3.5–5.1)

## 2021-10-27 LAB — POCT I-STAT 7, (LYTES, BLD GAS, ICA,H+H)
Acid-Base Excess: 10 mmol/L — ABNORMAL HIGH (ref 0.0–2.0)
Bicarbonate: 32.9 mmol/L — ABNORMAL HIGH (ref 20.0–28.0)
Calcium, Ion: 1.15 mmol/L (ref 1.15–1.40)
HCT: 28 % — ABNORMAL LOW (ref 39.0–52.0)
Hemoglobin: 9.5 g/dL — ABNORMAL LOW (ref 13.0–17.0)
O2 Saturation: 97 %
Patient temperature: 98.2
Potassium: 3.3 mmol/L — ABNORMAL LOW (ref 3.5–5.1)
Sodium: 141 mmol/L (ref 135–145)
TCO2: 34 mmol/L — ABNORMAL HIGH (ref 22–32)
pCO2 arterial: 35.2 mmHg (ref 32–48)
pH, Arterial: 7.579 — ABNORMAL HIGH (ref 7.35–7.45)
pO2, Arterial: 80 mmHg — ABNORMAL LOW (ref 83–108)

## 2021-10-27 LAB — MRSA NEXT GEN BY PCR, NASAL: MRSA by PCR Next Gen: NOT DETECTED

## 2021-10-27 LAB — MAGNESIUM
Magnesium: 1.9 mg/dL (ref 1.7–2.4)
Magnesium: 1.9 mg/dL (ref 1.7–2.4)

## 2021-10-27 LAB — LACTIC ACID, PLASMA: Lactic Acid, Venous: 1.3 mmol/L (ref 0.5–1.9)

## 2021-10-27 MED ORDER — POTASSIUM CHLORIDE 20 MEQ PO PACK
40.0000 meq | PACK | Freq: Once | ORAL | Status: AC
  Administered 2021-10-27: 40 meq
  Filled 2021-10-27: qty 2

## 2021-10-27 NOTE — Progress Notes (Signed)
[facility] Progress Note Patient Name: Austin Davis DOB: 1940/04/01 MRN: [id number]   Date of Service  10/27/2021  HPI/Events of Note  Review of Abdominal film reveals normal nonobstructive bowel gas pattern.  No free air identified.   eICU Interventions  Continue present management.     Intervention Category Major Interventions: Other:  Necha Harries Cornelia Copa 10/27/2021, 4:15 AM

## 2021-10-27 NOTE — Progress Notes (Signed)
[facility] Progress Note Patient Name: Austin Davis DOB: [birth year] MRN: [id number]   Date of Service  10/27/2021  HPI/Events of Note  ABG on 40%/PRVC 18/TV 580/P 5 = 7.579/35.2/80/32.9. Patient is overbreathing the set ventilator rate at 20. Reluctant to sedate patient heavily to control respiratory rate.   eICU Interventions  Plan: Continue present management. Defer further management to PCCM rounding team.      Intervention Category Major Interventions: Acid-Base disturbance - evaluation and management;Respiratory failure - evaluation and management  Aleecia Tapia Cornelia Copa 10/27/2021, 4:17 AM

## 2021-10-27 NOTE — Progress Notes (Addendum)
NAME:  Austin Davis, MRN:  [id number], DOB:  24-Jul-1940, LOS: 1 ADMISSION DATE:  10/26/2021, CONSULTATION DATE: 10/26/2021 REFERRING MD: Emergency department physician, CHIEF COMPLAINT: Acute on chronic hypercarbic respiratory failure  History of Present Illness:  Austin Davis is a 80 year old male resident of a skilled nursing facility [facility] is trach dependent but not normally ventilator dependent.  He has been admitted to [facility] hospital before for trach dislodgment and this appears to be what happened today.  When he arrived in the emergency room the respiratory therapist advance to trach and the ventilator shows proper volume return from delivery with good air movement and equal bilateral breath sounds.  He has been placed on a ventilator for his chronic and now acute hypercarbic respiratory failure.  Questionable pneumonia by x-ray and since he is a nursing home resident we will change his antibiotics to vancomycin and cefepime.  We will admit him to the intensive care unit and try to liberate him from mechanical ventilator support and return to [facility] in a timely manner.  He has an extensive past medical history that is well-documented below.  Pertinent  Medical History   Past Medical History:  Diagnosis Date   CHF (congestive heart failure) ([place])    Coronary artery disease    Essential tremor    GI bleed 2019   hospitalized at [facility] for one week   Headache    since childhood   Low blood sugar    since childhood, controlled by diet   Mitral regurgitation    Mitral valve prolapse    Osteopenia 2021   Paroxysmal atrial fibrillation ([place])    Tremor      Significant Hospital Events: Including procedures, antibiotic start and stop dates in addition to other pertinent events   10/7 Admit  Interim History / Subjective:   Reamains on the ventilator. ABG reviewed  Objective   Blood pressure 113/74, pulse 70, temperature 98.7 F (37.1 C), temperature  source Oral, resp. rate 18, height 5\' 10"  (1.778 m), weight 57.9 kg, SpO2 100 %.    Vent Mode: PRVC FiO2 (%):  [40 %] 40 % Set Rate:  [18 bmp-28 bmp] 18 bmp Vt Set:  [580 mL] 580 mL PEEP:  [5 cmH20] 5 cmH20 Plateau Pressure:  [18 cmH20-24 cmH20] 18 cmH20   Intake/Output Summary (Last 24 hours) at 10/27/2021 1048 Last data filed at 10/27/2021 0926 Gross per 24 hour  Intake 3215.89 ml  Output 550 ml  Net 2665.89 ml   Filed Weights   10/26/21 0822 10/27/21 0500  Weight: 55 kg 57.9 kg    Examination: Blood pressure 113/74, pulse 70, temperature 98.7 F (37.1 C), temperature source Oral, resp. rate 18, height 5\' 10"  (1.778 m), weight 57.9 kg, SpO2 100 %. Gen:      No acute distress, frail ,elderly HEENT:  EOMI, sclera anicteric Neck:     No masses; no thyromegaly, trach Lungs:    Clear to auscultation bilaterally; normal respiratory effort CV:         Regular rate and rhythm; no murmurs Abd:      + bowel sounds; soft, non-tender; no palpable masses, no distension Ext:    No edema; adequate peripheral perfusion Skin:      Warm and dry; no rash Neuro: Sedated  Labs/imaging reviewed Significant for pH 7.57, PCO2 35 Potassium 3.1, WBC 12.6 X-ray shows increasing consolidation in the left lower lobe.  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  Presents with tracheostomy dislodgment, hypercarbic respiratory failure Hospital-acquired pneumonia  Continue vent support Pulmonary toilet Intermittent chest x-ray Continue cefepime.  Vancomycin DC'd as MRSA swab was negative  Generalized failure to thrive, chronic malnutrition Tube feeds  Parkinson disease, tremors Restart Inderal  Seizure disorder Continue benzodiazepine  Hypothyroidism Continue Synthroid 25 mcg tablet per PEG tube  Chronic hypotension Continue midodrine  Malnutrition Continue tube  feedings  Chronic anxiety Continue Xanax as needed  Best Practice (right click and "Reselect all SmartList Selections" daily)   Diet/type: tubefeeds DVT prophylaxis: prophylactic heparin  GI prophylaxis: PPI Lines: N/A Foley:  N/A Code Status:  full code  Last date of multidisciplinary goals of care discussion [tbd]. Daughter called on 10/8 and updated.  Critical care time:    The patient is critically ill with multiple organ system failure and requires high complexity decision making for assessment and support, frequent evaluation and titration of therapies, advanced monitoring, review of radiographic studies and interpretation of complex data.   Critical Care Time devoted to patient care services, exclusive of separately billable procedures, described in this note is 35 minutes.   Marshell Garfinkel MD [facility] See Amion for pager  If no response to pager , please call [phone] until 7pm After 7:00 pm call Elink  [phone] 10/27/2021, 10:51 AM

## 2021-10-28 DIAGNOSIS — J189 Pneumonia, unspecified organism: Secondary | ICD-10-CM

## 2021-10-28 DIAGNOSIS — J9622 Acute and chronic respiratory failure with hypercapnia: Secondary | ICD-10-CM

## 2021-10-28 DIAGNOSIS — J9621 Acute and chronic respiratory failure with hypoxia: Secondary | ICD-10-CM | POA: Diagnosis not present

## 2021-10-28 LAB — BASIC METABOLIC PANEL
Anion gap: 7 (ref 5–15)
BUN: 25 mg/dL — ABNORMAL HIGH (ref 8–23)
CO2: 28 mmol/L (ref 22–32)
Calcium: 8.5 mg/dL — ABNORMAL LOW (ref 8.9–10.3)
Chloride: 104 mmol/L (ref 98–111)
Creatinine, Ser: 0.35 mg/dL — ABNORMAL LOW (ref 0.61–1.24)
GFR, Estimated: >60 mL/min (ref >60)
Glucose, Bld: 119 mg/dL — ABNORMAL HIGH (ref 70–99)
Potassium: 3.8 mmol/L (ref 3.5–5.1)
Sodium: 139 mmol/L (ref 135–145)

## 2021-10-28 LAB — CULTURE, BLOOD (ROUTINE X 2): Special Requests: ADEQUATE

## 2021-10-28 LAB — URINE CULTURE: Culture: >=100000 — AB

## 2021-10-28 LAB — CBC
HCT: 26.9 % — ABNORMAL LOW (ref 39.0–52.0)
Hemoglobin: 9 g/dL — ABNORMAL LOW (ref 13.0–17.0)
MCH: 27.5 pg (ref 26.0–34.0)
MCHC: 33.5 g/dL (ref 30.0–36.0)
MCV: 82.3 fL (ref 80.0–100.0)
Platelets: 270 10*3/uL (ref 150–400)
RBC: 3.27 MIL/uL — ABNORMAL LOW (ref 4.22–5.81)
RDW: 18.1 % — ABNORMAL HIGH (ref 11.5–15.5)
WBC: 9.6 10*3/uL (ref 4.0–10.5)
nRBC: 0 % (ref 0.0–0.2)

## 2021-10-28 LAB — GLUCOSE, CAPILLARY
Glucose-Capillary: 124 mg/dL — ABNORMAL HIGH (ref 70–99)
Glucose-Capillary: 125 mg/dL — ABNORMAL HIGH (ref 70–99)
Glucose-Capillary: 109 mg/dL — ABNORMAL HIGH (ref 70–99)
Glucose-Capillary: 112 mg/dL — ABNORMAL HIGH (ref 70–99)
Glucose-Capillary: 124 mg/dL — ABNORMAL HIGH (ref 70–99)
Glucose-Capillary: 111 mg/dL — ABNORMAL HIGH (ref 70–99)

## 2021-10-28 LAB — PHOSPHORUS: Phosphorus: 1.6 mg/dL — ABNORMAL LOW (ref 2.5–4.6)

## 2021-10-28 LAB — MAGNESIUM: Magnesium: 1.9 mg/dL (ref 1.7–2.4)

## 2021-10-28 MED ORDER — PROPRANOLOL HCL 20 MG/5ML PO SOLN: 10.0000 mg | Freq: Two times a day (BID) | ORAL | Status: DC | PRN

## 2021-10-28 MED ORDER — POTASSIUM PHOSPHATES 15 MMOLE/5ML IV SOLN
45.0000 mmol | Freq: Once | INTRAVENOUS | Status: AC
  Administered 2021-10-28: 45 mmol via INTRAVENOUS
  Filled 2021-10-28: qty 15

## 2021-10-28 MED ORDER — TAMSULOSIN HCL 0.4 MG PO CAPS
0.4000 mg | ORAL_CAPSULE | Freq: Every day | ORAL | Status: DC
  Administered 2021-10-28 – 2021-11-01 (×5): 0.4 mg via ORAL
  Filled 2021-10-28 (×5): qty 1

## 2021-10-28 MED ORDER — LEVOTHYROXINE SODIUM 25 MCG PO TABS
25.0000 ug | ORAL_TABLET | Freq: Every day | ORAL | Status: DC
  Administered 2021-10-29 – 2021-11-01 (×4): 25 ug
  Filled 2021-10-28 (×4): qty 1

## 2021-10-28 MED ORDER — MAGNESIUM SULFATE 2 GM/50ML IV SOLN
2.0000 g | Freq: Once | INTRAVENOUS | Status: AC
  Administered 2021-10-28: 2 g via INTRAVENOUS
  Filled 2021-10-28: qty 50

## 2021-10-28 MED ORDER — SERTRALINE HCL 25 MG PO TABS
25.0000 mg | ORAL_TABLET | Freq: Every day | ORAL | Status: DC
  Administered 2021-10-28 – 2021-11-01 (×5): 25 mg
  Filled 2021-10-28 (×5): qty 1

## 2021-10-28 NOTE — Progress Notes (Signed)
NAME:  Austin Davis, MRN:  [id number], DOB:  February 24, 1940, LOS: 2 ADMISSION DATE:  10/26/2021, CONSULTATION DATE: 10/26/2021 REFERRING MD: Emergency department physician, CHIEF COMPLAINT: Acute on chronic hypercarbic respiratory failure  History of Present Illness:  81 yo male transferred from [facility] after tracheostomy dislodgement.  He was found to have acute on chronic hypoxia and hypercapnia and started on ventilator support - wasn't on ventilator support at [facility].  He was started on antibiotics for HCAP.  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  Objective   Blood pressure (!) 112/90, pulse 68, temperature 98.3 F (36.8 C), temperature source Axillary, resp. rate (!) 22, height 5\' 10"  (1.778 m), weight 58.6 kg, SpO2 98 %.    Vent Mode: PRVC FiO2 (%):  [40 %] 40 % Set Rate:  [18 bmp] 18 bmp Vt Set:  [580 mL] 580 mL PEEP:  [5 cmH20] 5 cmH20 Pressure Support:  [12 cmH20] 12 cmH20 Plateau Pressure:  [18 cmH20-21 cmH20] 21 cmH20   Intake/Output Summary (Last 24 hours) at 10/28/2021 0801 Last data filed at 10/28/2021 0600 Gross per 24 hour  Intake 3269.73 ml  Output 3240 ml  Net 29.73 ml   Filed Weights   10/26/21 0822 10/27/21 0500 10/28/21 0541  Weight: 55 kg 57.9 kg 58.6 kg    Examination:  General - alert, cachectic Eyes - pupils reactive ENT - trach site clean Cardiac - regular rate/rhythm, no murmur Chest - scattered rhonchi Abdomen - soft, non tender, G tube site clean Extremities - decreased muscle bulk Skin - no rashes Neuro - follows simple commands  Resolved Hospital Problem list     Assessment & Plan:   Acute on chronic  hypoxic/hypercapnic respiratory failure in setting of Lt lower lobe HCAP. Tracheostomy dependent. - pressure support wean to trach collar as tolerated  - day 3 of cefepime - goal SpO2 > 92% - f/u CXR intermittently  Chronic diastolic/systolic CHF. PAF s/p MAZE. CAD. Chronic hypotension. - EF 45 to 50% from Echo 09/17/20 - continue midodrine - not candidate for chronic anticoagulation due to GI bleeding  MDR Klebsiella pneumoniae. - in urine culture on admission - contact isolation  Cachexia. Deconditioning. - tube feeds - PT/OT  Hx of Tremors, Seizures, Depression with anxiety. - continue keppra - restart zoloft - prn xanax, atarax, ativan - prn propranolol for tremor  Hx of Hypothyroidism. - continue synthroid  Anemia of critical illness and chronic disease. - f/u CBC intermittently - transfuse for Hb < 7   Hypophosphatemia. - f/u electrolytes  Urine retention. - restart flomax  Disposition. - hopefully will be ready to transfer back to [facility] soon  Best Practice (right click and "Reselect all SmartList Selections" daily)   Diet/type: tubefeeds DVT prophylaxis: prophylactic heparin  GI prophylaxis: PPI Lines: N/A Foley:  N/A Code Status:  full code  Last date of multidisciplinary goals of care discussion [tbd]. Daughter called on 10/8 and updated.  Labs:      Latest Ref Rng & Units 10/28/2021    2:35 AM 10/27/2021    9:02 PM 10/27/2021    7:55 AM  CMP  Glucose 70 - 99 mg/dL 119   124   BUN 8 - 23  mg/dL 25   31   Creatinine 0.61 - 1.24 mg/dL 0.35   0.49   Sodium 135 - 145 mmol/L 139   140   Potassium 3.5 - 5.1 mmol/L 3.8  3.8  3.1   Chloride 98 - 111 mmol/L 104   100   CO2 22 - 32 mmol/L 28   31   Calcium 8.9 - 10.3 mg/dL 8.5   8.5        Latest Ref Rng & Units 10/28/2021    2:35 AM 10/27/2021    7:55 AM 10/27/2021    2:45 AM  CBC  WBC 4.0 - 10.5 K/uL 9.6  12.6    Hemoglobin 13.0 - 17.0 g/dL 9.0  9.7  9.5   Hematocrit 39.0 - 52.0 % 26.9   30.6  28.0   Platelets 150 - 400 K/uL 270  303      ABG    Component Value Date/Time   PHART 7.579 (H) 10/27/2021 0245   PCO2ART 35.2 10/27/2021 0245   PO2ART 80 (L) 10/27/2021 0245   HCO3 32.9 (H) 10/27/2021 0245   TCO2 34 (H) 10/27/2021 0245   ACIDBASEDEF 2.0 05/30/2021 0052   O2SAT 97 10/27/2021 0245    CBG (last 3)  Recent Labs    10/27/21 2315 10/28/21 0309 10/28/21 0745  GLUCAP 133* 124* 125*    Signature:  Chesley Mires, MD [place] Pager - [phone] 10/28/2021, 8:15 AM

## 2021-10-28 NOTE — Progress Notes (Signed)
PT Cancellation Note  Patient Details Name: Austin Davis MRN: [id number] DOB: [birth year]   Cancelled Treatment:    Reason Eval/Treat Not Completed: PT screened, no needs identified, will sign off; patient is from long term care.  No PT needs at this time.   Reginia Naas 10/28/2021, 10:08 AM Magda Kiel, PT Acute Rehabilitation Services [phone] 10/28/2021

## 2021-10-28 NOTE — Progress Notes (Signed)
Pt incont of stool and urine. While layed flat for cleaning/bathing and on pressure support pt sustained SPO2 in 40s and became bradycardic. 100% O2 initiated and Pt placed on full support. Slow to return to SPO2 goals. Bradycardia resolved with full respiratory support on vent. Pt resting comfortably with SPO2 and HR at baseline. See Vitals.

## 2021-10-28 NOTE — Progress Notes (Signed)
OT Cancellation Note  Patient Details Name: Austin Davis MRN: [id number] DOB: [birth year]   Cancelled Treatment:    Reason Eval/Treat Not Completed: OT screened, no needs identified, will sign off;Other (comment) (From LTC at [place]. Plan is to return to [facility].)  [facility] 10/28/2021, 1:31 PM .hil

## 2021-10-29 DIAGNOSIS — J189 Pneumonia, unspecified organism: Secondary | ICD-10-CM | POA: Diagnosis not present

## 2021-10-29 DIAGNOSIS — J9622 Acute and chronic respiratory failure with hypercapnia: Secondary | ICD-10-CM | POA: Diagnosis not present

## 2021-10-29 DIAGNOSIS — J9621 Acute and chronic respiratory failure with hypoxia: Secondary | ICD-10-CM | POA: Diagnosis not present

## 2021-10-29 LAB — CBC
HCT: 28.6 % — ABNORMAL LOW (ref 39.0–52.0)
Hemoglobin: 9.9 g/dL — ABNORMAL LOW (ref 13.0–17.0)
MCH: 28.2 pg (ref 26.0–34.0)
MCHC: 34.6 g/dL (ref 30.0–36.0)
MCV: 81.5 fL (ref 80.0–100.0)
Platelets: 295 10*3/uL (ref 150–400)
RBC: 3.51 MIL/uL — ABNORMAL LOW (ref 4.22–5.81)
RDW: 18.2 % — ABNORMAL HIGH (ref 11.5–15.5)
WBC: 11.9 10*3/uL — ABNORMAL HIGH (ref 4.0–10.5)
nRBC: 0 % (ref 0.0–0.2)

## 2021-10-29 LAB — BASIC METABOLIC PANEL
Anion gap: 9 (ref 5–15)
BUN: 22 mg/dL (ref 8–23)
CO2: 25 mmol/L (ref 22–32)
Calcium: 8.3 mg/dL — ABNORMAL LOW (ref 8.9–10.3)
Chloride: 100 mmol/L (ref 98–111)
Creatinine, Ser: 0.31 mg/dL — ABNORMAL LOW (ref 0.61–1.24)
GFR, Estimated: >60 mL/min (ref >60)
Glucose, Bld: 127 mg/dL — ABNORMAL HIGH (ref 70–99)
Potassium: 4.3 mmol/L (ref 3.5–5.1)
Sodium: 134 mmol/L — ABNORMAL LOW (ref 135–145)

## 2021-10-29 LAB — PHOSPHORUS: Phosphorus: 2.9 mg/dL (ref 2.5–4.6)

## 2021-10-29 LAB — MAGNESIUM: Magnesium: 2.2 mg/dL (ref 1.7–2.4)

## 2021-10-29 LAB — GLUCOSE, CAPILLARY
Glucose-Capillary: 132 mg/dL — ABNORMAL HIGH (ref 70–99)
Glucose-Capillary: 120 mg/dL — ABNORMAL HIGH (ref 70–99)
Glucose-Capillary: 104 mg/dL — ABNORMAL HIGH (ref 70–99)
Glucose-Capillary: 110 mg/dL — ABNORMAL HIGH (ref 70–99)
Glucose-Capillary: 112 mg/dL — ABNORMAL HIGH (ref 70–99)

## 2021-10-29 MED ORDER — GLYCOPYRROLATE 1 MG PO TABS
1.0000 mg | ORAL_TABLET | Freq: Three times a day (TID) | ORAL | Status: DC
  Administered 2021-10-29 – 2021-11-01 (×9): 1 mg
  Filled 2021-10-29 (×9): qty 1

## 2021-10-29 NOTE — Progress Notes (Signed)
NAME:  Austin Davis, MRN:  [id number], DOB:  07-04-1940, LOS: 3 ADMISSION DATE:  10/26/2021, CONSULTATION DATE: 10/26/2021 REFERRING MD: Emergency department physician, CHIEF COMPLAINT: Acute on chronic hypercarbic respiratory failure  History of Present Illness:  81 yo male transferred from [facility] after tracheostomy dislodgement.  He was found to have acute on chronic hypoxia and hypercapnia and started on ventilator support - wasn't on ventilator support at [facility].  He was started on antibiotics for HCAP.  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  Had episode of desaturation while bathing yesterday, but quickly resolved.  Objective   Blood pressure 110/77, pulse 75, temperature 98.1 F (36.7 C), temperature source Axillary, resp. rate 19, height 5\' 10"  (1.778 m), weight 57.2 kg, SpO2 97 %.    Vent Mode: PRVC FiO2 (%):  [40 %-100 %] 40 % Set Rate:  [18 bmp] 18 bmp Vt Set:  [580 mL] 580 mL PEEP:  [5 cmH20] 5 cmH20 Plateau Pressure:  [23 cmH20-26 cmH20] 24 cmH20   Intake/Output Summary (Last 24 hours) at 10/29/2021 0825 Last data filed at 10/29/2021 0800 Gross per 24 hour  Intake 2317.72 ml  Output 2000 ml  Net 317.72 ml   Filed Weights   10/27/21 0500 10/28/21 0541 10/29/21 0535  Weight: 57.9 kg 58.6 kg 57.2 kg    Examination:  General - cachectic Eyes - pupils reactive ENT - jaw tremor, trach site clean Cardiac - regular, tachycardic Chest - Rt > Lt rhonchi Abdomen - soft, non tender, G tube site clean Extremities - decreased muscle bulk Skin - no rashes Neuro - follows simple commands  Resolved Hospital Problem list     Assessment & Plan:    Acute on chronic hypoxic/hypercapnic respiratory failure in setting of Lt lower lobe HCAP. Tracheostomy dependent. - pressure support wean to trach collar as able - goal SpO2 > 92% - f/u CXR intermittently - Abx day 4, currently on cefepime  Chronic diastolic/systolic CHF. PAF s/p MAZE. CAD. Chronic hypotension. - EF 45 to 50% from Echo 09/17/20 - continue midodrine - not candidate for chronic anticoagulation due to GI bleeding  MDR Klebsiella pneumoniae. - in urine culture on admission - don't think this is causing an infection - contact isolation  Cachexia. Deconditioning. - tube feeds - PT/OT screened on 10/09 >> no PT/OT needs identified and they signed off  Hx of Tremors, Seizures, Depression with anxiety. - continue keppra, zoloft - prn xanax, atarax, ativan - prn propranolol for tremor  Hx of Hypothyroidism. - continue synthroid  Anemia of critical illness and chronic disease. - f/u CBC intermittently - transfuse for Hb < 7   Urine retention. - continue flomax  Disposition. - have asked transitions of care team to look into transfer back to [facility] (right click and "Reselect all SmartList Selections" daily)   Diet/type: tubefeeds DVT prophylaxis: prophylactic heparin  GI prophylaxis: PPI Lines: N/A Foley:  N/A Code Status:  full code  Last date of multidisciplinary goals of care discussion [tbd]. Daughter called on 10/8 and updated.  Labs:      Latest Ref Rng & Units 10/29/2021    3:03 AM 10/28/2021    2:35 AM 10/27/2021    9:02  PM  CMP  Glucose 70 - 99 mg/dL 127  119    BUN 8 - 23 mg/dL 22  25    Creatinine 0.61 - 1.24 mg/dL 0.31  0.35    Sodium 135 - 145 mmol/L 134  139    Potassium 3.5 - 5.1 mmol/L 4.3  3.8  3.8   Chloride 98 - 111 mmol/L 100  104    CO2 22 - 32 mmol/L 25  28    Calcium 8.9 - 10.3 mg/dL 8.3  8.5         Latest Ref Rng & Units 10/29/2021    3:03 AM 10/28/2021    2:35 AM 10/27/2021    7:55 AM  CBC  WBC  4.0 - 10.5 K/uL 11.9  9.6  12.6   Hemoglobin 13.0 - 17.0 g/dL 9.9  9.0  9.7   Hematocrit 39.0 - 52.0 % 28.6  26.9  30.6   Platelets 150 - 400 K/uL 295  270  303     ABG    Component Value Date/Time   PHART 7.579 (H) 10/27/2021 0245   PCO2ART 35.2 10/27/2021 0245   PO2ART 80 (L) 10/27/2021 0245   HCO3 32.9 (H) 10/27/2021 0245   TCO2 34 (H) 10/27/2021 0245   ACIDBASEDEF 2.0 05/30/2021 0052   O2SAT 97 10/27/2021 0245    CBG (last 3)  Recent Labs    10/28/21 2318 10/29/21 0311 10/29/21 0740  GLUCAP 111* 132* 120*    Signature:  Chesley Mires, MD [place] Pager - [phone] 10/29/2021, 8:25 AM

## 2021-10-30 DIAGNOSIS — J9622 Acute and chronic respiratory failure with hypercapnia: Secondary | ICD-10-CM | POA: Diagnosis not present

## 2021-10-30 DIAGNOSIS — J189 Pneumonia, unspecified organism: Secondary | ICD-10-CM | POA: Diagnosis not present

## 2021-10-30 DIAGNOSIS — J9621 Acute and chronic respiratory failure with hypoxia: Secondary | ICD-10-CM | POA: Diagnosis not present

## 2021-10-30 LAB — GLUCOSE, CAPILLARY
Glucose-Capillary: 108 mg/dL — ABNORMAL HIGH (ref 70–99)
Glucose-Capillary: 117 mg/dL — ABNORMAL HIGH (ref 70–99)
Glucose-Capillary: 119 mg/dL — ABNORMAL HIGH (ref 70–99)
Glucose-Capillary: 120 mg/dL — ABNORMAL HIGH (ref 70–99)
Glucose-Capillary: 121 mg/dL — ABNORMAL HIGH (ref 70–99)
Glucose-Capillary: 125 mg/dL — ABNORMAL HIGH (ref 70–99)
Glucose-Capillary: 91 mg/dL (ref 70–99)

## 2021-10-30 LAB — CULTURE, RESPIRATORY W GRAM STAIN

## 2021-10-30 MED ORDER — MIDODRINE HCL 5 MG PO TABS
10.0000 mg | ORAL_TABLET | Freq: Three times a day (TID) | ORAL | Status: DC
  Administered 2021-10-30 – 2021-11-01 (×6): 10 mg
  Filled 2021-10-30 (×7): qty 2

## 2021-10-30 NOTE — Progress Notes (Signed)
NAME:  Austin Davis, MRN:  [id number], DOB:  [birth year], LOS: 4 ADMISSION DATE:  10/26/2021, CONSULTATION DATE: 10/26/2021 REFERRING MD: Dr. Matilde Sprang, ER COMPLAINT: Acute on chronic hypercarbic respiratory failure  History of Present Illness:  81 yo male transferred from [facility] after tracheostomy dislodgement.  He was found to have acute on chronic hypoxia and hypercapnia and started on ventilator support - wasn't on ventilator support at [facility].  He was started on antibiotics for HCAP.  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  Objective   Blood pressure 128/67, pulse 64, temperature 97.6 F (36.4 C), temperature source Oral, resp. rate (!) 24, height 5\' 10"  (1.778 m), weight 56.3 kg, SpO2 100 %.    Vent Mode: PRVC FiO2 (%):  [30 %] 30 % Set Rate:  [18 bmp] 18 bmp Vt Set:  [580 mL] 580 mL PEEP:  [5 cmH20] 5 cmH20 Plateau Pressure:  [14 cmH20-24 cmH20] 17 cmH20   Intake/Output Summary (Last 24 hours) at 10/30/2021 0843 Last data filed at 10/30/2021 0800 Gross per 24 hour  Intake 1960.07 ml  Output 2000 ml  Net -39.93 ml   Filed Weights   10/28/21 0541 10/29/21 0535 10/30/21 0500  Weight: 58.6 kg 57.2 kg 56.3 kg    Examination:  General - cachetic Eyes - pupils reactive ENT - trach site clean, jaw tremor better Cardiac - regular rate/rhythm, no murmur Chest - scattered rhonchi Abdomen - soft, non tender, + bowel sounds Extremities - decreased muscle bulk Skin - no rashes Neuro - normal strength, moves extremities, follows commands Psych - normal mood and behavior  Resolved Hospital Problem list     Assessment & Plan:   Acute on chronic  hypoxic/hypercapnic respiratory failure in setting of Lt lower lobe HCAP. Tracheostomy dependent. - PS to TC wean as tolerated - robinul for respiratory secretions - goal SpO2 > 92% - Abx day 5 of 7, currently on cefepime - f/u CXR intermittently  Chronic diastolic/systolic CHF. PAF s/p MAZE. CAD. Chronic hypotension. - EF 45 to 50% from Echo 09/17/20 - continue midodrine - not candidate for chronic anticoagulation due to GI bleeding  MDR Klebsiella pneumoniae. - in urine culture on admission - don't think this is causing an infection - contact isolation  Cachexia. Deconditioning. - tube feeds - PT/OT screened on 10/09 >> no PT/OT needs identified and they signed off  Hx of Tremors, Seizures, Depression with anxiety. - continue keppra, zoloft - prn xanax, atarax, ativan - prn propranolol for tremor  Hx of Hypothyroidism. - continue synthroid  Anemia of critical illness and chronic disease. - f/u CBC intermittently - transfuse for Hb < 7   Urine retention. - continue flomax  Disposition. - he is ready to transfer to [facility] (right click and "Reselect all SmartList Selections" daily)   Diet/type: tubefeeds DVT prophylaxis: prophylactic heparin  GI prophylaxis: PPI Lines: N/A Foley:  N/A Code Status:  full code  Last date of multidisciplinary goals of care discussion. Daughter called on 10/8 and updated.  Labs:      Latest Ref Rng & Units 10/29/2021    3:03 AM 10/28/2021    2:35 AM 10/27/2021    9:02 PM  CMP  Glucose  70 - 99 mg/dL 127  119    BUN 8 - 23 mg/dL 22  25    Creatinine 0.61 - 1.24 mg/dL 0.31  0.35    Sodium 135 - 145 mmol/L 134  139    Potassium 3.5 - 5.1 mmol/L 4.3  3.8  3.8   Chloride 98 - 111 mmol/L 100  104    CO2 22 - 32 mmol/L 25  28    Calcium 8.9 - 10.3 mg/dL 8.3  8.5         Latest Ref Rng & Units 10/29/2021    3:03 AM 10/28/2021    2:35 AM 10/27/2021    7:55 AM  CBC  WBC 4.0 - 10.5 K/uL 11.9  9.6  12.6    Hemoglobin 13.0 - 17.0 g/dL 9.9  9.0  9.7   Hematocrit 39.0 - 52.0 % 28.6  26.9  30.6   Platelets 150 - 400 K/uL 295  270  303     ABG    Component Value Date/Time   PHART 7.579 (H) 10/27/2021 0245   PCO2ART 35.2 10/27/2021 0245   PO2ART 80 (L) 10/27/2021 0245   HCO3 32.9 (H) 10/27/2021 0245   TCO2 34 (H) 10/27/2021 0245   ACIDBASEDEF 2.0 05/30/2021 0052   O2SAT 97 10/27/2021 0245    CBG (last 3)  Recent Labs    10/30/21 0005 10/30/21 0312 10/30/21 0806  GLUCAP 125* 120* 121*    Signature:  Chesley Mires, MD [place] Pager - [phone] 10/30/2021, 8:43 AM

## 2021-10-30 NOTE — TOC Progression Note (Addendum)
Transition of Care [facility]) - Initial/Assessment Note    Patient Details  Name: Austin Davis MRN: [id number] Date of Birth: Dec 10, 1940  Transition of [facility] CM/SW Contact:    Milinda Antis, LCSWA Phone Number: 10/30/2021, 3:40 PM  Clinical Narrative:                 CSW contacted Raquel Sarna with admissions at [facility] to inquire about the patient being admitted to the [facility] side of the facility.  The patient was recently on the SNF side.  Admissions with check the patient's insurance to see if he has any more LTACH days available.    CSW is awaiting a response.     Patient Goals and CMS Choice        Expected Discharge Plan and Services                                                Prior Living Arrangements/Services                       Activities of Daily Living      Permission Sought/Granted                  Emotional Assessment              Admission diagnosis:  Acute on chronic respiratory failure ([place]) [J96.20] Patient Active Problem List   Diagnosis Date Noted   Acute on chronic respiratory failure ([place]) 10/26/2021   Seizures ([place]) 06/26/2021   Tremor    Sepsis ([place]) 06/25/2021   Cardiac arrest due to respiratory disorder ([place]) 06/07/2021   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   Gastrostomy tube dependent ([place]) 06/04/2021   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   Impaired quality of life 05/30/2021   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 05/15/2021   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[place]) 05/08/2021   Pneumonia 05/08/2021   Urinary tract infection associated with indwelling urethral catheter ([place]) 05/08/2021   Encephalopathy 05/08/2021   Pressure injury of skin 04/30/2021   Seizure disorder ([place]) 04/29/2021   Pneumothorax 04/11/2021   Ventilator dependent ([place])    Aspiration pneumonia ([place]) 03/25/2021   Parkinsonism 03/25/2021   Obstructive sleep apnea 10/24/2020   Chronic obstructive pulmonary disease ([place]) 10/24/2020   Chronic  heart failure with preserved ejection fraction ([place]) 10/24/2020   Unintentional weight loss 09/20/2020   SVT (supraventricular tachycardia) 09/20/2020   Protein-calorie malnutrition, severe 09/17/2020   Acute on chronic respiratory failure with hypoxia and hypercapnia ([place]) 09/16/2020   Failure to thrive in adult 09/16/2020   Stage 1 skin ulcer of sacral region ([place]) 09/16/2020   Chronic hypotension 06/30/2020   History of GI bleed 02/17/2017   Rectal bleeding 02/16/2017   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 [place] [place] [address] Phone: [phone] Fax: [phone]     Social Determinants of Health (SDOH) Interventions    Readmission Risk Interventions    06/26/2021   11:10 AM  Readmission Risk Prevention  Plan  Transportation Screening Complete  Medication Review Press [employer]) Complete  PCP or Specialist appointment within 3-5 days of discharge Complete  HRI or Home Care Consult Complete  SW Recovery Care/Counseling Consult Complete  [place] Complete

## 2021-10-31 DIAGNOSIS — J189 Pneumonia, unspecified organism: Secondary | ICD-10-CM | POA: Diagnosis not present

## 2021-10-31 LAB — CULTURE, BLOOD (ROUTINE X 2)
Culture: NO GROWTH
Special Requests: ADEQUATE

## 2021-10-31 LAB — GLUCOSE, CAPILLARY
Glucose-Capillary: 128 mg/dL — ABNORMAL HIGH (ref 70–99)
Glucose-Capillary: 131 mg/dL — ABNORMAL HIGH (ref 70–99)

## 2021-10-31 MED ORDER — ALBUMIN HUMAN 25 % IV SOLN
12.5000 g | Freq: Once | INTRAVENOUS | Status: AC
  Administered 2021-10-31: 12.5 g via INTRAVENOUS
  Filled 2021-10-31: qty 50

## 2021-10-31 NOTE — Progress Notes (Signed)
NAME:  RHONDA VANGIESON, MRN:  [id number], DOB:  May 31, 1940, LOS: 5 ADMISSION DATE:  10/26/2021, CONSULTATION DATE: 10/26/2021 REFERRING MD: Dr. Matilde Sprang, ER COMPLAINT: Acute on chronic hypercarbic respiratory failure  History of Present Illness:  81 yo male transferred from [facility] after tracheostomy dislodgement.  He was found to have acute on chronic hypoxia and hypercapnia and started on ventilator support - wasn't on ventilator support at [facility].  He was started on antibiotics for HCAP.  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  Objective   Blood pressure (!) 167/145, pulse 64, temperature 98.1 F (36.7 C), temperature source Oral, resp. rate 18, height 5\' 10"  (1.778 m), weight 56.3 kg, SpO2 100 %.    Vent Mode: PRVC FiO2 (%):  [30 %] 30 % Set Rate:  [18 bmp] 18 bmp Vt Set:  [580 mL] 580 mL PEEP:  [5 cmH20] 5 cmH20 Plateau Pressure:  [15 EYC14-48 cmH20] 16 cmH20   Intake/Output Summary (Last 24 hours) at 10/31/2021 0810 Last data filed at 10/31/2021 0700 Gross per 24 hour  Intake 2256.86 ml  Output 3500 ml  Net -1243.14 ml   Filed Weights   10/28/21 0541 10/29/21 0535 10/30/21 0500  Weight: 58.6 kg 57.2 kg 56.3 kg    Examination:  General - alert, cachectic Eyes - pupils reactive ENT - trach site clean Cardiac - regular rate/rhythm, no murmur Chest - better air movement, decreased congestion Abdomen - soft, non tender, + bowel sounds Extremities - no cyanosis, clubbing, or edema Skin - no rashes Neuro - follows commands  Resolved Hospital Problem list     Assessment & Plan:   Acute on chronic hypoxic/hypercapnic respiratory failure in setting of Lt  lower lobe HCAP. Tracheostomy dependent. - try to wean to TC as able - continue robinul for now to help with respiratory secretions - goal SpO2 > 92% - Abx day 6 of 7 - f/u CXR intermittently  Chronic diastolic/systolic CHF. PAF s/p MAZE. CAD. Chronic hypotension. - EF 45 to 50% from Echo 09/17/20 - continue midodrine - not candidate for chronic anticoagulation due to GI bleeding  MDR Klebsiella pneumoniae. - in urine culture on admission - don't think this is causing an infection - contact isolation  Cachexia. Deconditioning. - tube feeds - PT/OT screened on 10/09 >> no PT/OT needs identified and they signed off  Hx of Tremors, Seizures, Depression with anxiety. - continue keppra, zoloft - prn xanax, atarax, ativan - prn propranolol for tremor  Hx of Hypothyroidism. - continue synthroid  Anemia of critical illness and chronic disease. - f/u CBC intermittently - transfuse for Hb < 7   Urine retention. - continue flomax  Disposition. - he is ready to transfer to LTAC if he has slow vent wean, or SNF if he can transition off vent more quickly  Best Practice (right click and "Reselect all SmartList Selections" daily)   Diet/type: tubefeeds DVT prophylaxis: prophylactic heparin  GI prophylaxis: PPI Lines: N/A Foley:  N/A Code Status:  full code  Last date of multidisciplinary goals of care discussion. Daughter called on 10/8 and updated.  Labs:      Latest Ref Rng & Units 10/29/2021    3:03 AM 10/28/2021  2:35 AM 10/27/2021    9:02 PM  CMP  Glucose 70 - 99 mg/dL 127  119    BUN 8 - 23 mg/dL 22  25    Creatinine 0.61 - 1.24 mg/dL 0.31  0.35    Sodium 135 - 145 mmol/L 134  139    Potassium 3.5 - 5.1 mmol/L 4.3  3.8  3.8   Chloride 98 - 111 mmol/L 100  104    CO2 22 - 32 mmol/L 25  28    Calcium 8.9 - 10.3 mg/dL 8.3  8.5         Latest Ref Rng & Units 10/29/2021    3:03 AM 10/28/2021    2:35 AM 10/27/2021    7:55 AM  CBC  WBC 4.0 - 10.5 K/uL 11.9   9.6  12.6   Hemoglobin 13.0 - 17.0 g/dL 9.9  9.0  9.7   Hematocrit 39.0 - 52.0 % 28.6  26.9  30.6   Platelets 150 - 400 K/uL 295  270  303     ABG    Component Value Date/Time   PHART 7.579 (H) 10/27/2021 0245   PCO2ART 35.2 10/27/2021 0245   PO2ART 80 (L) 10/27/2021 0245   HCO3 32.9 (H) 10/27/2021 0245   TCO2 34 (H) 10/27/2021 0245   ACIDBASEDEF 2.0 05/30/2021 0052   O2SAT 97 10/27/2021 0245    CBG (last 3)  Recent Labs    10/30/21 1936 10/30/21 2341 10/31/21 0346  GLUCAP 91 108* 128*    Signature:  Chesley Mires, MD [place] Pager - [phone] 10/31/2021, 8:10 AM

## 2021-10-31 NOTE — Progress Notes (Signed)
[facility] Progress Note Patient Name: Austin Davis DOB: March 08, 1940 MRN: [id number]   Date of Service  10/31/2021  HPI/Events of Note  Hypotension - BP soft = 88/49 with MAP = 55. Patient now awake and BP 99/67 with MAP = 73. Patient on Midodrine, therefore, soft BP may be a chronic issue. Albumin = 3.1.  eICU Interventions  Plan: 25% Albumin 12.5 gm IV X 1.      Intervention Category Major Interventions: Hypotension - evaluation and management  Austin Davis 10/31/2021, 3:52 AM

## 2021-10-31 NOTE — Plan of Care (Signed)

## 2021-10-31 NOTE — TOC Progression Note (Addendum)
Transition of Care [facility]) - Initial/Assessment Note    Patient Details  Name: Austin Davis MRN: [id number] Date of Birth: March 05, 1940  Transition of Care [facility]) CM/SW Contact:    Milinda Antis, [place] Phone Number: 10/31/2021, 11:00 AM  Clinical Narrative:                 CSW informed that the patient does not have the Medicare days to go to [place] contacted [facility] to inquire about the patient's ability to return on vent.    15:10- CSW sent clinicals to admissions at [facility].  TOC will continue to follow.         Patient Goals and CMS Choice        Expected Discharge Plan and Services                                                Prior Living Arrangements/Services                       Activities of Daily Living      Permission Sought/Granted                  Emotional Assessment              Admission diagnosis:  Acute on chronic respiratory failure ([place]) [J96.20] Patient Active Problem List   Diagnosis Date Noted   Acute on chronic respiratory failure ([place]) 10/26/2021   Seizures ([place]) 06/26/2021   Tremor    Sepsis ([place]) 06/25/2021   Cardiac arrest due to respiratory disorder ([place]) 06/07/2021   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   Impaired quality of life 05/30/2021   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 05/15/2021   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[place]) 05/08/2021   Pneumonia 05/08/2021   Urinary tract infection associated with indwelling urethral catheter ([place]) 05/08/2021   Encephalopathy 05/08/2021   Pressure injury of skin 04/30/2021   Seizure disorder ([place]) 04/29/2021   Pneumothorax 04/11/2021   Ventilator dependent ([place])    Aspiration pneumonia ([place]) 03/25/2021   Parkinsonism 03/25/2021   Obstructive sleep apnea 10/24/2020   Chronic obstructive pulmonary disease ([place]) 10/24/2020   Chronic heart failure with preserved ejection  fraction ([place]) 10/24/2020   Unintentional weight loss 09/20/2020   SVT (supraventricular tachycardia) 09/20/2020   Protein-calorie malnutrition, severe 09/17/2020   Acute on chronic respiratory failure with hypoxia and hypercapnia ([place]) 09/16/2020   Failure to thrive in adult 09/16/2020   Stage 1 skin ulcer of sacral region ([place]) 09/16/2020   Chronic hypotension 06/30/2020   History of GI bleed 02/17/2017   Rectal bleeding 02/16/2017   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 [place]. [place] [address] Phone: [phone] Fax: [phone]     Social Determinants of Health (SDOH) Interventions    Readmission Risk Interventions    06/26/2021   11:10 AM  Readmission Risk Prevention Plan  Transportation Screening  Complete  Medication Review Press photographer) Complete  PCP or Specialist appointment within 3-5 days of discharge Complete  HRI or Home Care Consult Complete  SW Recovery Care/Counseling Consult Complete  [place] Complete

## 2021-11-01 ENCOUNTER — Inpatient Hospital Stay (HOSPITAL_COMMUNITY): Payer: Medicare Other

## 2021-11-01 DIAGNOSIS — R0989 Other specified symptoms and signs involving the circulatory and respiratory systems: Secondary | ICD-10-CM | POA: Diagnosis not present

## 2021-11-01 DIAGNOSIS — G4089 Other seizures: Secondary | ICD-10-CM | POA: Diagnosis not present

## 2021-11-01 DIAGNOSIS — Z430 Encounter for attention to tracheostomy: Secondary | ICD-10-CM | POA: Diagnosis not present

## 2021-11-01 DIAGNOSIS — J9622 Acute and chronic respiratory failure with hypercapnia: Secondary | ICD-10-CM | POA: Diagnosis not present

## 2021-11-01 DIAGNOSIS — R1310 Dysphagia, unspecified: Secondary | ICD-10-CM | POA: Diagnosis not present

## 2021-11-01 DIAGNOSIS — G894 Chronic pain syndrome: Secondary | ICD-10-CM | POA: Diagnosis not present

## 2021-11-01 DIAGNOSIS — R569 Unspecified convulsions: Secondary | ICD-10-CM | POA: Diagnosis not present

## 2021-11-01 DIAGNOSIS — Z9911 Dependence on respirator [ventilator] status: Secondary | ICD-10-CM | POA: Diagnosis not present

## 2021-11-01 DIAGNOSIS — J189 Pneumonia, unspecified organism: Secondary | ICD-10-CM | POA: Diagnosis not present

## 2021-11-01 DIAGNOSIS — J9620 Acute and chronic respiratory failure, unspecified whether with hypoxia or hypercapnia: Secondary | ICD-10-CM | POA: Diagnosis not present

## 2021-11-01 DIAGNOSIS — J9611 Chronic respiratory failure with hypoxia: Secondary | ICD-10-CM | POA: Diagnosis not present

## 2021-11-01 DIAGNOSIS — R000 Tachycardia, unspecified: Secondary | ICD-10-CM | POA: Diagnosis not present

## 2021-11-01 DIAGNOSIS — R109 Unspecified abdominal pain: Secondary | ICD-10-CM | POA: Diagnosis not present

## 2021-11-01 DIAGNOSIS — I82402 Acute embolism and thrombosis of unspecified deep veins of left lower extremity: Secondary | ICD-10-CM | POA: Diagnosis not present

## 2021-11-01 DIAGNOSIS — I480 Paroxysmal atrial fibrillation: Secondary | ICD-10-CM | POA: Diagnosis not present

## 2021-11-01 DIAGNOSIS — Z4659 Encounter for fitting and adjustment of other gastrointestinal appliance and device: Secondary | ICD-10-CM | POA: Diagnosis not present

## 2021-11-01 DIAGNOSIS — Z931 Gastrostomy status: Secondary | ICD-10-CM | POA: Diagnosis not present

## 2021-11-01 DIAGNOSIS — F419 Anxiety disorder, unspecified: Secondary | ICD-10-CM | POA: Diagnosis not present

## 2021-11-01 DIAGNOSIS — J9503 Malfunction of tracheostomy stoma: Secondary | ICD-10-CM | POA: Diagnosis not present

## 2021-11-01 DIAGNOSIS — I5023 Acute on chronic systolic (congestive) heart failure: Secondary | ICD-10-CM | POA: Diagnosis not present

## 2021-11-01 DIAGNOSIS — R188 Other ascites: Secondary | ICD-10-CM | POA: Diagnosis not present

## 2021-11-01 DIAGNOSIS — J449 Chronic obstructive pulmonary disease, unspecified: Secondary | ICD-10-CM | POA: Diagnosis not present

## 2021-11-01 DIAGNOSIS — Z431 Encounter for attention to gastrostomy: Secondary | ICD-10-CM | POA: Diagnosis not present

## 2021-11-01 DIAGNOSIS — G4733 Obstructive sleep apnea (adult) (pediatric): Secondary | ICD-10-CM | POA: Diagnosis not present

## 2021-11-01 DIAGNOSIS — J9621 Acute and chronic respiratory failure with hypoxia: Secondary | ICD-10-CM | POA: Diagnosis not present

## 2021-11-01 LAB — CBC
HCT: 30.2 % — ABNORMAL LOW (ref 39.0–52.0)
Hemoglobin: 10.3 g/dL — ABNORMAL LOW (ref 13.0–17.0)
MCH: 28.1 pg (ref 26.0–34.0)
MCHC: 34.1 g/dL (ref 30.0–36.0)
MCV: 82.5 fL (ref 80.0–100.0)
Platelets: 349 10*3/uL (ref 150–400)
RBC: 3.66 MIL/uL — ABNORMAL LOW (ref 4.22–5.81)
RDW: 18.6 % — ABNORMAL HIGH (ref 11.5–15.5)
WBC: 9.1 10*3/uL (ref 4.0–10.5)
nRBC: 0 % (ref 0.0–0.2)

## 2021-11-01 LAB — BASIC METABOLIC PANEL
Anion gap: 8 (ref 5–15)
BUN: 27 mg/dL — ABNORMAL HIGH (ref 8–23)
CO2: 25 mmol/L (ref 22–32)
Calcium: 9 mg/dL (ref 8.9–10.3)
Chloride: 102 mmol/L (ref 98–111)
Creatinine, Ser: 0.45 mg/dL — ABNORMAL LOW (ref 0.61–1.24)
GFR, Estimated: >60 mL/min (ref >60)
Glucose, Bld: 136 mg/dL — ABNORMAL HIGH (ref 70–99)
Potassium: 4.2 mmol/L (ref 3.5–5.1)
Sodium: 135 mmol/L (ref 135–145)

## 2021-11-01 MED ORDER — FREE WATER
100.0000 mL | Status: DC
  Administered 2021-11-01: 100 mL

## 2021-11-01 NOTE — Discharge Summary (Signed)
Physician Discharge Summary  Patient ID: TAEGAN STANDAGE MRN: [id number] DOB/AGE: Jul 31, 1940 81 y.o.  Admit date: 10/26/2021 Discharge date: 11/01/2021  Problem List Principal Problem:   Acute on chronic respiratory failure [facility])  HPI: Mr. Mcmurtry is a 81 year old male resident of a skilled nursing facility [facility] is trach dependent but not normally ventilator dependent.  He has been admitted to [facility] hospital before for trach dislodgment and this appears to be what happened today.  When he arrived in the emergency room the respiratory therapist advance to trach and the ventilator shows proper volume return from delivery with good air movement and equal bilateral breath sounds.  He has been placed on a ventilator for his chronic and now acute hypercarbic respiratory failure.  Questionable pneumonia by x-ray and since he is a nursing home resident we will change his antibiotics to vancomycin and cefepime.  We will admit him to the intensive care unit and try to liberate him from mechanical ventilator support and return to [facility] in a timely manner.  He has an extensive past medical history that is well-documented below.   Hospital Course:   Significant Hospital Events: Including procedures, antibiotic start and stop dates in addition to other pertinent events   10/07 Admit 10/09 pressure support weaning Fails to meet the criteria for extubation  Objective   Blood pressure 101/67, pulse 69, temperature 98.3 F (36.8 C), temperature source Axillary, resp. rate 19, height 5\' 10"  (1.778 m), weight 58.1 kg, SpO2 96 % Examination: General: Awake and alert difficult to communicate secondary to facial trauma. HEENT: MM pink/moist tracheostomy is in place connected to the mechanical ventilatory support Neuro: Grossly intact except for tremor CV: Heart sounds are regular PULM: Diminished throughout Vent currently on pressure support 10 of PEEP FIO2 40% PEEP 10 RATE  spontaneous VT moving around 4 to 500 cc/breath       GI: soft, bsx4 active  Extremities: warm/dry, negative edema  Skin: no rashes or lesions    No decubitus Resolved Hospital Problem list       Assessment & Plan:    Acute on chronic hypoxic/hypercapnic respiratory failure in setting of Lt lower lobe HCAP. Tracheostomy dependent. Continues to fail weaning trials Wean per protocol Intermittent chest x-ray Day 7 of antibiotics Suspect he will not wean at this time     Chronic diastolic/systolic CHF. PAF s/p MAZE. CAD. Chronic hypotension. Continue midodrine     MDR Klebsiella pneumoniae. Contact isolation No overt evidence of infection   Cachexia. Tube feeding   Deconditioning. PT and OT has signed off   Hx of Tremors, Seizures, Depression with anxiety. Continue Keppra and Zoloft As needed Xanax Atarax and Ativan As needed Inderal for tremor         Hx of Hypothyroidism. Continue Synthroid Anemia of critical illness and chronic disease. Recent Labs (last 2 labs)      Recent Labs    11/01/21 0406  HGB 10.3*    Transfuse per protocol     Urine retention. Continue Flomax   Back to [facility] on got him up full mechanical ventilator support   Labs at discharge Lab Results  Component Value Date   CREATININE 0.45 (L) 11/01/2021   BUN 27 (H) 11/01/2021   NA 135 11/01/2021   K 4.2 11/01/2021   CL 102 11/01/2021   CO2 25 11/01/2021   Lab Results  Component Value Date   WBC 9.1 11/01/2021   HGB 10.3 (L) 11/01/2021   HCT 30.2 (  L) 11/01/2021   MCV 82.5 11/01/2021   PLT 349 11/01/2021   Lab Results  Component Value Date   ALT 94 (H) 10/26/2021   AST 37 10/26/2021   ALKPHOS 184 (H) 10/26/2021   BILITOT 0.6 10/26/2021   Lab Results  Component Value Date   INR 1.2 10/26/2021   INR 1.0 05/08/2021   INR 1.1 03/26/2021    Current radiology studies DG Chest Port 1 View  Result Date: 11/01/2021 CLINICAL DATA:  HCAP  (healthcare-associated pneumonia) EXAM: PORTABLE CHEST 1 VIEW COMPARISON:  Chest x-ray 08/27/2021. FINDINGS: Similar position of tracheostomy tube when accounting for differences in technique. Similar cardiomediastinal silhouette. Median sternotomy. Improved left basilar aeration. Possible small left pleural effusion. IMPRESSION: Improved left basilar aeration. Possible small left pleural effusion. Electronically Signed   By: Margaretha Sheffield M.D.   On: 11/01/2021 08:14    Disposition:  Discharge disposition: 02-Transferred to [facility]        Allergies as of 11/01/2021       Reactions   Prednisone Other (See Comments)   Listed as allergy on MAR Unknown reaction   Cortisone Other (See Comments)   Listed as allergy per MAR Unknown reaction        Medication List     STOP taking these medications    fexofenadine 60 MG tablet Commonly known as: ALLEGRA   fluticasone 50 MCG/ACT nasal spray Commonly known as: FLONASE   Geri-Tussin 100 MG/5ML liquid Generic drug: guaiFENesin   ondansetron 4 MG disintegrating tablet Commonly known as: ZOFRAN-ODT   valproic acid 250 MG capsule Commonly known as: DEPAKENE       TAKE these medications    acetaminophen 325 MG tablet Commonly known as: TYLENOL Take 650 mg by mouth every 8 (eight) hours as needed for mild pain or fever.   ALPRAZolam 0.5 MG tablet Commonly known as: XANAX Place 0.5 mg into feeding tube every 8 (eight) hours as needed for anxiety.   CETAPHIL GENTLE CLEANSER EX Apply 10 mLs topically daily.   Culturelle Digestive Health Caps Give 1 capsule by tube daily. 10 billion   CULTURELLE PROBIOTICS PO Give 1 capsule by tube daily.   esomeprazole 40 MG capsule Commonly known as: NEXIUM Take 40 mg by mouth daily. Via feeding tube   feeding supplement (OSMOLITE 1.5 CAL) Liqd Place 55 mL/hr into feeding tube See admin instructions. 55 ml/hr for 22-24 hours = 1815-1980 kcal & 1520-1603 ml    feeding supplement (JEVITY 1.5 CAL/FIBER) Liqd Place 1,000 mLs into feeding tube continuous.   Fish Oil 1000 MG Caps Take 1,000 mg by mouth daily.   folic acid 1 MG tablet Commonly known as: FOLVITE Take 1 tablet (1 mg total) by mouth daily. What changed: how to take this   furosemide 20 MG tablet Commonly known as: LASIX Place 1 tablet (20 mg total) into feeding tube daily as needed for fluid or edema. What changed: when to take this   hydrOXYzine 25 MG tablet Commonly known as: ATARAX Place 25 mg into feeding tube every 6 (six) hours as needed for anxiety.   lactulose 10 GM/15ML solution Commonly known as: CHRONULAC Place 20 g into feeding tube every 12 (twelve) hours as needed (constipation).   levalbuterol 1.25 MG/3ML nebulizer solution Commonly known as: XOPENEX Take 1.25 mg by nebulization every 6 (six) hours.   levETIRAcetam 100 MG/ML solution Commonly known as: KEPPRA Place 1,500 mg into feeding tube in the morning and at bedtime.   levothyroxine 25  MCG tablet Commonly known as: SYNTHROID Place 25 mcg into feeding tube daily.   LORazepam 2 MG/ML injection Commonly known as: ATIVAN 2 mg as needed (seizures).   midodrine 10 MG tablet Commonly known as: PROAMATINE Place 1 tablet (10 mg total) into feeding tube 3 (three) times daily with meals. What changed: when to take this   multivitamin with minerals Tabs tablet Place 1 tablet into feeding tube daily.   polyethylene glycol 17 g packet Commonly known as: MIRALAX / GLYCOLAX Place 17 g into feeding tube every other day.   propranolol 10 MG tablet Commonly known as: INDERAL Place 1 tablet (10 mg total) into feeding tube daily as needed (for tremors in AM. do not administer if heart rate is below 60 or systolic blood pressure is below 90.). What changed:  when to take this additional instructions   sertraline 25 MG tablet Commonly known as: ZOLOFT Place 25 mg into feeding tube daily.   sterile water  for irrigation Irrigate with 100 mLs as directed See admin instructions. 100 ml H2O flush every 4 hours   tamsulosin 0.4 MG Caps capsule Commonly known as: FLOMAX Take 0.4 mg by mouth daily. Via feeding tube          Discharged Condition: fair  Time spent on discharge  40 minutes.  Vital signs at Discharge. Temp:  [97.8 F (36.6 C)-98.5 F (36.9 C)] 98.2 F (36.8 C) (10/13 1047) Pulse Rate:  [60-83] 61 (10/13 1015) Resp:  [14-25] 19 (10/13 1015) BP: (87-115)/(51-90) 104/71 (10/13 1015) SpO2:  [93 %-100 %] 100 % (10/13 1015) FiO2 (%):  [30 %] 30 % (10/13 0722) Weight:  [58.1 kg] 58.1 kg (10/13 0320) Office follow up Special Information or instructions. He has been discharged to [facility] to the specialist on the types of ventilators.  No need to follow-up with pulmonary critical care. Signed: Richardson Landry Kona Yusuf ACNP Acute Care Nurse Practitioner [place] Please consult [place] 11/01/2021, 10:55 AM

## 2021-11-01 NOTE — TOC Transition Note (Signed)
Transition of Care [facility]) - CM/SW Discharge Note   Patient Details  Name: KALYN DIMATTIA MRN: [id number] Date of Birth: 1940-01-22  [facility]) CM/SW Contact:  Milinda Antis, [place] Phone Number: 11/01/2021, 1:16 PM   Clinical Narrative:    Patient will DC to:  [facility] Anticipated DC date: 11/01/2021 Family notified: Yes Transport by: Karen Chafe   Per MD patient ready for DC to SNF. RN to call report prior to discharge ([phone] room 309; receiving attending Dr. Rudi Heap). RN,  patient's family, and facility notified of DC. Discharge Summary sent to facility. DC packet on chart. Ambulance transport requested for patient by floor RN.   CSW will sign off for now as social work intervention is no longer needed. Please consult [place] again if new needs arise.     Final next level of care: Skilled Nursing Facility Barriers to Discharge: Barriers Resolved   Patient Goals and CMS Choice        Discharge Placement              Patient chooses bed at:  ([facility]) Patient to be transferred to facility by: [place] Name of family member notified: Alvia, Tory (Daughter)   [phone] Patient and family notified of of transfer: 11/01/21  Discharge Plan and Services                                     Social Determinants of Health (SDOH) Interventions     Readmission Risk Interventions    06/26/2021   11:10 AM  Readmission Risk Prevention Plan  Transportation Screening Complete  Medication Review Press photographer) Complete  PCP or Specialist appointment within 3-5 days of discharge Complete  HRI or Home Care Consult Complete  SW Recovery Care/Counseling Consult Complete  [place] Complete

## 2021-11-01 NOTE — Progress Notes (Signed)
NAME:  Austin Davis, MRN:  [id number], DOB:  08/25/1940, LOS: 6 ADMISSION DATE:  10/26/2021, CONSULTATION DATE: 10/26/2021 REFERRING MD: Dr. Matilde Sprang, ER COMPLAINT: Acute on chronic hypercarbic respiratory failure  History of Present Illness:  81 yo male transferred from [facility] after tracheostomy dislodgement.  He was found to have acute on chronic hypoxia and hypercapnia and started on ventilator support - wasn't on ventilator support at [facility].  He was started on antibiotics for HCAP.  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  Objective   Blood pressure 101/67, pulse 69, temperature 98.3 F (36.8 C), temperature source Axillary, resp. rate 19, height 5\' 10"  (1.778 m), weight 58.1 kg, SpO2 96 %.    Vent Mode: PSV;CPAP FiO2 (%):  [30 %] 30 % Set Rate:  [18 bmp] 18 bmp Vt Set:  [580 mL] 580 mL PEEP:  [5 cmH20] 5 cmH20 Pressure Support:  [10 cmH20] 10 cmH20 Plateau Pressure:  [16 cmH20-17 cmH20] 16 cmH20   Intake/Output Summary (Last 24 hours) at 11/01/2021 0828 Last data filed at 11/01/2021 0600 Gross per 24 hour  Intake 1869.97 ml  Output 2030 ml  Net -160.03 ml   Filed Weights   10/29/21 0535 10/30/21 0500 11/01/21 0320  Weight: 57.2 kg 56.3 kg 58.1 kg    Examination: General: Awake and alert difficult to communicate secondary to facial trauma. HEENT: MM pink/moist tracheostomy is in place connected to the mechanical ventilatory support Neuro: Grossly intact except for tremor CV: Heart sounds are regular PULM: Diminished throughout Vent currently on pressure support 10 of PEEP FIO2 40% PEEP 10 RATE spontaneous VT moving around 4 to 500 cc/breath    GI:  soft, bsx4 active  Extremities: warm/dry, negative edema  Skin: no rashes or lesions   No decubitus Resolved Hospital Problem list     Assessment & Plan:   Acute on chronic hypoxic/hypercapnic respiratory failure in setting of Lt lower lobe HCAP. Tracheostomy dependent. Continues to fail weaning trials Wean per protocol Intermittent chest x-ray Day 7 of antibiotics Suspect he will not wean at this time   Chronic diastolic/systolic CHF. PAF s/p MAZE. CAD. Chronic hypotension. Continue midodrine   MDR Klebsiella pneumoniae. Contact isolation No overt evidence of infection  Cachexia. Tube feeding  Deconditioning. PT and OT has signed off  Hx of Tremors, Seizures, Depression with anxiety. Continue Keppra and Zoloft As needed Xanax Atarax and Ativan As needed Inderal for tremor     Hx of Hypothyroidism. Continue Synthroid Anemia of critical illness and chronic disease. Recent Labs    11/01/21 0406  HGB 10.3*  Transfuse per protocol   Urine retention. Continue Flomax  Disposition. 11/01/2021 he cannot be liberated from mechanical ventilatory support at this time he may behoove [place] to find SNF to take ventilator.  Best Practice (right click and "Reselect all SmartList Selections" daily)   Diet/type: tubefeeds DVT prophylaxis: prophylactic heparin  GI prophylaxis: PPI Lines: N/A Foley:  N/A Code Status:  full code  Last date of multidisciplinary goals of care discussion. Daughter called on 10/8 and updated.  Labs:      Latest Ref Rng & Units 11/01/2021    4:06 AM 10/29/2021    3:03 AM 10/28/2021  2:35 AM  CMP  Glucose 70 - 99 mg/dL 136  127  119   BUN 8 - 23 mg/dL 27  22  25    Creatinine 0.61 - 1.24 mg/dL 0.45  0.31  0.35   Sodium 135 - 145 mmol/L 135  134  139   Potassium 3.5 - 5.1 mmol/L 4.2  4.3  3.8   Chloride 98 - 111 mmol/L 102  100  104   CO2 22 - 32 mmol/L 25  25  28    Calcium 8.9 - 10.3 mg/dL 9.0  8.3  8.5        Latest Ref  Rng & Units 11/01/2021    4:06 AM 10/29/2021    3:03 AM 10/28/2021    2:35 AM  CBC  WBC 4.0 - 10.5 K/uL 9.1  11.9  9.6   Hemoglobin 13.0 - 17.0 g/dL 10.3  9.9  9.0   Hematocrit 39.0 - 52.0 % 30.2  28.6  26.9   Platelets 150 - 400 K/uL 349  295  270     ABG    Component Value Date/Time   PHART 7.579 (H) 10/27/2021 0245   PCO2ART 35.2 10/27/2021 0245   PO2ART 80 (L) 10/27/2021 0245   HCO3 32.9 (H) 10/27/2021 0245   TCO2 34 (H) 10/27/2021 0245   ACIDBASEDEF 2.0 05/30/2021 0052   O2SAT 97 10/27/2021 0245    CBG (last 3)  Recent Labs    10/30/21 2341 10/31/21 0346 10/31/21 1935  GLUCAP 108* 128* 131*    Signature:  Richardson Landry Jahnay Lantier ACNP Acute Care Nurse Practitioner [place] Please consult [place] 11/01/2021, 8:28 AM

## 2021-11-01 NOTE — Progress Notes (Signed)
Called and set up carelink. Report given to the nurse at [place].

## 2021-11-01 NOTE — Progress Notes (Signed)
Nutrition Follow-up  DOCUMENTATION CODES:   Underweight, Severe malnutrition in context of chronic illness  INTERVENTION:   Continue tube feeds via PEG: - Osmolite 1.5 @ 55 ml/hr (1320 ml/day) - PROSource TF20 60 ml daily - Free water flushes of 100 ml q 4 hours  Tube feeding regimen provides 2060 kcal, 103 grams of protein, and 1006 ml of H2O.  Total free water with flushes: 1606 ml  NUTRITION DIAGNOSIS:   Severe Malnutrition related to chronic illness (CHF, COPD) as evidenced by severe fat depletion, severe muscle depletion.  New diagnosis after completion of NFPE  GOAL:   Patient will meet greater than or equal to 90% of their needs  Met via TF  MONITOR:   TF tolerance, Weight trends, I & O's, Labs, Vent status  REASON FOR ASSESSMENT:   Consult Enteral/tube feeding initiation and management  ASSESSMENT:   81 y.o. male presented to the ED from [facility] due to seizure like behavior and hypoxia. PMH includes trach dependent, s/p PEG, CHF, HLD, malnutrition, tremor, and COPD. Pt admitted with vent requirements after trach dislodgement.  Discussed pt with RN and during ICU rounds. Per RN, PEG tube site is leaking but does not resemble tube feeds. Trying to wean to trach collar. RD to add maintenance free water flushes.  Admit weight: 55 kg Current weight: 58.1 kg  Current TF: Osmolite 1.5 @ 55 ml/hr, PROSource TF20 60 ml daily  Patient remains on ventilator support via trach MV: 8.8 L/min Temp (24hrs), Avg:98.3 F (36.8 C), Min:97.8 F (36.6 C), Max:98.5 F (36.9 C)  Medications reviewed and include: protonix, IV abx  Labs reviewed: BUN 27 CBG's: 91-131 x 24 hours  UOP: 2150 ml x 24 hours I/O's: +1.7 L since admit  NUTRITION - FOCUSED PHYSICAL EXAM:  Flowsheet Row Most Recent Value  Orbital Region Severe depletion  Upper Arm Region Severe depletion  Thoracic and Lumbar Region Severe depletion  Buccal Region Severe depletion  Temple Region Severe  depletion  Clavicle Bone Region Severe depletion  Clavicle and Acromion Bone Region Severe depletion  Scapular Bone Region Severe depletion  Dorsal Hand Severe depletion  Patellar Region Severe depletion  Anterior Thigh Region Severe depletion  Posterior Calf Region Severe depletion  Edema (RD Assessment) None  Hair Reviewed  Eyes Reviewed  Mouth Reviewed  Skin Reviewed  Nails Reviewed       Diet Order:   Diet Order     None       EDUCATION NEEDS:   Not appropriate for education at this time  Skin:  Skin Assessment: Reviewed RN Assessment  Last BM:  10/31/21 type 6  Height:   Ht Readings from Last 1 Encounters:  10/26/21 _0  (1.778 m)    Weight:   Wt Readings from Last 1 Encounters:  11/01/21 58.1 kg    Ideal Body Weight:  75.5 kg  BMI:  Body mass index is 18.38 kg/m.  Estimated Nutritional Needs:   Kcal:  1900-2100  Protein:  95-115 grams  Fluid:  >/= 1.8 L    Gustavus Bryant, MS, RD, LDN Inpatient Clinical Dietitian Please see AMiON for contact information.

## 2021-11-01 NOTE — Progress Notes (Signed)
Patient seen today by trach team for consult.  No education is needed at this time.  All necessary equipment is at beside.   Will continue to follow for progression. Patient is still on full ventilatory support. 

## 2021-11-01 NOTE — Consult Note (Signed)
   [facility] Inpatient Consult   11/01/2021  Austin Davis 1940-02-16 [id number]  [place] [facility] [ACO] Patient:  Medicare ACO REACH  Primary Care Provider: Aaron Edelman, MD   Reviewed *Patient on the extreme high risk unplanned readmission risk for patient in ICU.  Patient is [place] [facility] no [place] Management plans for post hospital at SNF No [facility] follow up is planned transitional needs are for LTC.  For questions or referrals, please contact:  Natividad Brood, RN BSN [place]  [phone] business mobile phone Toll free office [phone]  *[place]  [phone] Fax number: [phone] [name]

## 2021-11-01 NOTE — Plan of Care (Signed)

## 2021-11-02 DIAGNOSIS — G894 Chronic pain syndrome: Secondary | ICD-10-CM | POA: Diagnosis not present

## 2021-11-02 DIAGNOSIS — I82402 Acute embolism and thrombosis of unspecified deep veins of left lower extremity: Secondary | ICD-10-CM | POA: Diagnosis not present

## 2021-11-02 DIAGNOSIS — Z431 Encounter for attention to gastrostomy: Secondary | ICD-10-CM | POA: Diagnosis not present

## 2021-11-02 DIAGNOSIS — J9620 Acute and chronic respiratory failure, unspecified whether with hypoxia or hypercapnia: Secondary | ICD-10-CM | POA: Diagnosis not present

## 2021-11-02 DIAGNOSIS — I480 Paroxysmal atrial fibrillation: Secondary | ICD-10-CM | POA: Diagnosis not present

## 2021-11-02 DIAGNOSIS — I5023 Acute on chronic systolic (congestive) heart failure: Secondary | ICD-10-CM | POA: Diagnosis not present

## 2021-11-02 DIAGNOSIS — G4089 Other seizures: Secondary | ICD-10-CM | POA: Diagnosis not present

## 2021-11-02 DIAGNOSIS — G4733 Obstructive sleep apnea (adult) (pediatric): Secondary | ICD-10-CM | POA: Diagnosis not present

## 2021-11-02 DIAGNOSIS — Z430 Encounter for attention to tracheostomy: Secondary | ICD-10-CM | POA: Diagnosis not present

## 2021-11-02 DIAGNOSIS — R1310 Dysphagia, unspecified: Secondary | ICD-10-CM | POA: Diagnosis not present

## 2021-11-02 DIAGNOSIS — F419 Anxiety disorder, unspecified: Secondary | ICD-10-CM | POA: Diagnosis not present

## 2021-11-02 DIAGNOSIS — Z9911 Dependence on respirator [ventilator] status: Secondary | ICD-10-CM | POA: Diagnosis not present

## 2021-11-02 DIAGNOSIS — J449 Chronic obstructive pulmonary disease, unspecified: Secondary | ICD-10-CM | POA: Diagnosis not present

## 2021-11-05 DIAGNOSIS — I480 Paroxysmal atrial fibrillation: Secondary | ICD-10-CM | POA: Diagnosis not present

## 2021-11-05 DIAGNOSIS — I5023 Acute on chronic systolic (congestive) heart failure: Secondary | ICD-10-CM | POA: Diagnosis not present

## 2021-11-05 DIAGNOSIS — J9620 Acute and chronic respiratory failure, unspecified whether with hypoxia or hypercapnia: Secondary | ICD-10-CM | POA: Diagnosis not present

## 2021-11-05 DIAGNOSIS — Z431 Encounter for attention to gastrostomy: Secondary | ICD-10-CM | POA: Diagnosis not present

## 2021-11-05 DIAGNOSIS — Z430 Encounter for attention to tracheostomy: Secondary | ICD-10-CM | POA: Diagnosis not present

## 2021-11-05 DIAGNOSIS — I82402 Acute embolism and thrombosis of unspecified deep veins of left lower extremity: Secondary | ICD-10-CM | POA: Diagnosis not present

## 2021-11-12 DIAGNOSIS — I82402 Acute embolism and thrombosis of unspecified deep veins of left lower extremity: Secondary | ICD-10-CM | POA: Diagnosis not present

## 2021-11-12 DIAGNOSIS — I5023 Acute on chronic systolic (congestive) heart failure: Secondary | ICD-10-CM | POA: Diagnosis not present

## 2021-11-12 DIAGNOSIS — Z431 Encounter for attention to gastrostomy: Secondary | ICD-10-CM | POA: Diagnosis not present

## 2021-11-12 DIAGNOSIS — Z430 Encounter for attention to tracheostomy: Secondary | ICD-10-CM | POA: Diagnosis not present

## 2021-11-12 DIAGNOSIS — I480 Paroxysmal atrial fibrillation: Secondary | ICD-10-CM | POA: Diagnosis not present

## 2021-11-12 DIAGNOSIS — J9620 Acute and chronic respiratory failure, unspecified whether with hypoxia or hypercapnia: Secondary | ICD-10-CM | POA: Diagnosis not present

## 2021-11-14 DIAGNOSIS — J9611 Chronic respiratory failure with hypoxia: Secondary | ICD-10-CM | POA: Diagnosis not present

## 2021-11-19 DIAGNOSIS — I5023 Acute on chronic systolic (congestive) heart failure: Secondary | ICD-10-CM | POA: Diagnosis not present

## 2021-11-19 DIAGNOSIS — Z431 Encounter for attention to gastrostomy: Secondary | ICD-10-CM | POA: Diagnosis not present

## 2021-11-19 DIAGNOSIS — J9620 Acute and chronic respiratory failure, unspecified whether with hypoxia or hypercapnia: Secondary | ICD-10-CM | POA: Diagnosis not present

## 2021-11-19 DIAGNOSIS — I480 Paroxysmal atrial fibrillation: Secondary | ICD-10-CM | POA: Diagnosis not present

## 2021-11-19 DIAGNOSIS — Z430 Encounter for attention to tracheostomy: Secondary | ICD-10-CM | POA: Diagnosis not present

## 2021-11-19 DIAGNOSIS — I82402 Acute embolism and thrombosis of unspecified deep veins of left lower extremity: Secondary | ICD-10-CM | POA: Diagnosis not present

## 2021-11-20 DIAGNOSIS — G894 Chronic pain syndrome: Secondary | ICD-10-CM | POA: Diagnosis not present

## 2021-11-20 DIAGNOSIS — G4089 Other seizures: Secondary | ICD-10-CM | POA: Diagnosis not present

## 2021-11-20 DIAGNOSIS — Z430 Encounter for attention to tracheostomy: Secondary | ICD-10-CM | POA: Diagnosis not present

## 2021-11-20 DIAGNOSIS — Z9911 Dependence on respirator [ventilator] status: Secondary | ICD-10-CM | POA: Diagnosis not present

## 2021-11-20 DIAGNOSIS — I82402 Acute embolism and thrombosis of unspecified deep veins of left lower extremity: Secondary | ICD-10-CM | POA: Diagnosis not present

## 2021-11-20 DIAGNOSIS — G4733 Obstructive sleep apnea (adult) (pediatric): Secondary | ICD-10-CM | POA: Diagnosis not present

## 2021-11-20 DIAGNOSIS — Z431 Encounter for attention to gastrostomy: Secondary | ICD-10-CM | POA: Diagnosis not present

## 2021-11-20 DIAGNOSIS — J449 Chronic obstructive pulmonary disease, unspecified: Secondary | ICD-10-CM | POA: Diagnosis not present

## 2021-11-20 DIAGNOSIS — F419 Anxiety disorder, unspecified: Secondary | ICD-10-CM | POA: Diagnosis not present

## 2021-11-20 DIAGNOSIS — R1310 Dysphagia, unspecified: Secondary | ICD-10-CM | POA: Diagnosis not present

## 2021-11-20 DIAGNOSIS — I5023 Acute on chronic systolic (congestive) heart failure: Secondary | ICD-10-CM | POA: Diagnosis not present

## 2021-11-20 DIAGNOSIS — I480 Paroxysmal atrial fibrillation: Secondary | ICD-10-CM | POA: Diagnosis not present

## 2021-11-20 DIAGNOSIS — J9620 Acute and chronic respiratory failure, unspecified whether with hypoxia or hypercapnia: Secondary | ICD-10-CM | POA: Diagnosis not present

## 2021-11-20 DIAGNOSIS — R000 Tachycardia, unspecified: Secondary | ICD-10-CM | POA: Diagnosis not present

## 2021-11-22 DIAGNOSIS — Z4659 Encounter for fitting and adjustment of other gastrointestinal appliance and device: Secondary | ICD-10-CM | POA: Diagnosis not present

## 2021-11-22 DIAGNOSIS — I480 Paroxysmal atrial fibrillation: Secondary | ICD-10-CM | POA: Diagnosis not present

## 2021-11-22 DIAGNOSIS — Z431 Encounter for attention to gastrostomy: Secondary | ICD-10-CM | POA: Diagnosis not present

## 2021-11-22 DIAGNOSIS — I5023 Acute on chronic systolic (congestive) heart failure: Secondary | ICD-10-CM | POA: Diagnosis not present

## 2021-11-22 DIAGNOSIS — Z430 Encounter for attention to tracheostomy: Secondary | ICD-10-CM | POA: Diagnosis not present

## 2021-11-22 DIAGNOSIS — I82402 Acute embolism and thrombosis of unspecified deep veins of left lower extremity: Secondary | ICD-10-CM | POA: Diagnosis not present

## 2021-11-22 DIAGNOSIS — J9620 Acute and chronic respiratory failure, unspecified whether with hypoxia or hypercapnia: Secondary | ICD-10-CM | POA: Diagnosis not present

## 2021-11-26 DIAGNOSIS — I480 Paroxysmal atrial fibrillation: Secondary | ICD-10-CM | POA: Diagnosis not present

## 2021-11-26 DIAGNOSIS — I5023 Acute on chronic systolic (congestive) heart failure: Secondary | ICD-10-CM | POA: Diagnosis not present

## 2021-11-26 DIAGNOSIS — I82402 Acute embolism and thrombosis of unspecified deep veins of left lower extremity: Secondary | ICD-10-CM | POA: Diagnosis not present

## 2021-11-26 DIAGNOSIS — J9620 Acute and chronic respiratory failure, unspecified whether with hypoxia or hypercapnia: Secondary | ICD-10-CM | POA: Diagnosis not present

## 2021-11-26 DIAGNOSIS — Z430 Encounter for attention to tracheostomy: Secondary | ICD-10-CM | POA: Diagnosis not present

## 2021-11-26 DIAGNOSIS — Z431 Encounter for attention to gastrostomy: Secondary | ICD-10-CM | POA: Diagnosis not present

## 2021-12-01 DIAGNOSIS — I82402 Acute embolism and thrombosis of unspecified deep veins of left lower extremity: Secondary | ICD-10-CM | POA: Diagnosis not present

## 2021-12-01 DIAGNOSIS — I480 Paroxysmal atrial fibrillation: Secondary | ICD-10-CM | POA: Diagnosis not present

## 2021-12-01 DIAGNOSIS — I5023 Acute on chronic systolic (congestive) heart failure: Secondary | ICD-10-CM | POA: Diagnosis not present

## 2021-12-01 DIAGNOSIS — Z430 Encounter for attention to tracheostomy: Secondary | ICD-10-CM | POA: Diagnosis not present

## 2021-12-01 DIAGNOSIS — Z431 Encounter for attention to gastrostomy: Secondary | ICD-10-CM | POA: Diagnosis not present

## 2021-12-01 DIAGNOSIS — J9620 Acute and chronic respiratory failure, unspecified whether with hypoxia or hypercapnia: Secondary | ICD-10-CM | POA: Diagnosis not present

## 2021-12-03 DIAGNOSIS — Z431 Encounter for attention to gastrostomy: Secondary | ICD-10-CM | POA: Diagnosis not present

## 2021-12-03 DIAGNOSIS — I5023 Acute on chronic systolic (congestive) heart failure: Secondary | ICD-10-CM | POA: Diagnosis not present

## 2021-12-03 DIAGNOSIS — I480 Paroxysmal atrial fibrillation: Secondary | ICD-10-CM | POA: Diagnosis not present

## 2021-12-03 DIAGNOSIS — J9620 Acute and chronic respiratory failure, unspecified whether with hypoxia or hypercapnia: Secondary | ICD-10-CM | POA: Diagnosis not present

## 2021-12-03 DIAGNOSIS — Z430 Encounter for attention to tracheostomy: Secondary | ICD-10-CM | POA: Diagnosis not present

## 2021-12-03 DIAGNOSIS — I82402 Acute embolism and thrombosis of unspecified deep veins of left lower extremity: Secondary | ICD-10-CM | POA: Diagnosis not present

## 2021-12-10 DIAGNOSIS — J9620 Acute and chronic respiratory failure, unspecified whether with hypoxia or hypercapnia: Secondary | ICD-10-CM | POA: Diagnosis not present

## 2021-12-10 DIAGNOSIS — Z430 Encounter for attention to tracheostomy: Secondary | ICD-10-CM | POA: Diagnosis not present

## 2021-12-10 DIAGNOSIS — I480 Paroxysmal atrial fibrillation: Secondary | ICD-10-CM | POA: Diagnosis not present

## 2021-12-10 DIAGNOSIS — I82402 Acute embolism and thrombosis of unspecified deep veins of left lower extremity: Secondary | ICD-10-CM | POA: Diagnosis not present

## 2021-12-10 DIAGNOSIS — Z431 Encounter for attention to gastrostomy: Secondary | ICD-10-CM | POA: Diagnosis not present

## 2021-12-10 DIAGNOSIS — I5023 Acute on chronic systolic (congestive) heart failure: Secondary | ICD-10-CM | POA: Diagnosis not present

## 2021-12-11 DIAGNOSIS — I480 Paroxysmal atrial fibrillation: Secondary | ICD-10-CM | POA: Diagnosis not present

## 2021-12-11 DIAGNOSIS — I5023 Acute on chronic systolic (congestive) heart failure: Secondary | ICD-10-CM | POA: Diagnosis not present

## 2021-12-11 DIAGNOSIS — Z430 Encounter for attention to tracheostomy: Secondary | ICD-10-CM | POA: Diagnosis not present

## 2021-12-11 DIAGNOSIS — J9620 Acute and chronic respiratory failure, unspecified whether with hypoxia or hypercapnia: Secondary | ICD-10-CM | POA: Diagnosis not present

## 2021-12-11 DIAGNOSIS — I82402 Acute embolism and thrombosis of unspecified deep veins of left lower extremity: Secondary | ICD-10-CM | POA: Diagnosis not present

## 2021-12-11 DIAGNOSIS — Z431 Encounter for attention to gastrostomy: Secondary | ICD-10-CM | POA: Diagnosis not present

## 2021-12-12 DIAGNOSIS — I82402 Acute embolism and thrombosis of unspecified deep veins of left lower extremity: Secondary | ICD-10-CM | POA: Diagnosis not present

## 2021-12-12 DIAGNOSIS — R109 Unspecified abdominal pain: Secondary | ICD-10-CM | POA: Diagnosis not present

## 2021-12-12 DIAGNOSIS — Z430 Encounter for attention to tracheostomy: Secondary | ICD-10-CM | POA: Diagnosis not present

## 2021-12-12 DIAGNOSIS — I5023 Acute on chronic systolic (congestive) heart failure: Secondary | ICD-10-CM | POA: Diagnosis not present

## 2021-12-12 DIAGNOSIS — Z931 Gastrostomy status: Secondary | ICD-10-CM | POA: Diagnosis not present

## 2021-12-12 DIAGNOSIS — R188 Other ascites: Secondary | ICD-10-CM | POA: Diagnosis not present

## 2021-12-12 DIAGNOSIS — Z431 Encounter for attention to gastrostomy: Secondary | ICD-10-CM | POA: Diagnosis not present

## 2021-12-12 DIAGNOSIS — I480 Paroxysmal atrial fibrillation: Secondary | ICD-10-CM | POA: Diagnosis not present

## 2021-12-12 DIAGNOSIS — J9620 Acute and chronic respiratory failure, unspecified whether with hypoxia or hypercapnia: Secondary | ICD-10-CM | POA: Diagnosis not present

## 2021-12-17 DIAGNOSIS — I82402 Acute embolism and thrombosis of unspecified deep veins of left lower extremity: Secondary | ICD-10-CM | POA: Diagnosis not present

## 2021-12-17 DIAGNOSIS — I5023 Acute on chronic systolic (congestive) heart failure: Secondary | ICD-10-CM | POA: Diagnosis not present

## 2021-12-17 DIAGNOSIS — J9620 Acute and chronic respiratory failure, unspecified whether with hypoxia or hypercapnia: Secondary | ICD-10-CM | POA: Diagnosis not present

## 2021-12-17 DIAGNOSIS — I480 Paroxysmal atrial fibrillation: Secondary | ICD-10-CM | POA: Diagnosis not present

## 2021-12-17 DIAGNOSIS — Z430 Encounter for attention to tracheostomy: Secondary | ICD-10-CM | POA: Diagnosis not present

## 2021-12-17 DIAGNOSIS — Z431 Encounter for attention to gastrostomy: Secondary | ICD-10-CM | POA: Diagnosis not present

## 2021-12-18 DIAGNOSIS — R0989 Other specified symptoms and signs involving the circulatory and respiratory systems: Secondary | ICD-10-CM | POA: Diagnosis not present

## 2021-12-18 DIAGNOSIS — J9611 Chronic respiratory failure with hypoxia: Secondary | ICD-10-CM | POA: Diagnosis not present

## 2021-12-26 DIAGNOSIS — R1084 Generalized abdominal pain: Secondary | ICD-10-CM | POA: Diagnosis not present

## 2021-12-26 DIAGNOSIS — K9423 Gastrostomy malfunction: Secondary | ICD-10-CM | POA: Diagnosis not present

## 2021-12-26 DIAGNOSIS — I4891 Unspecified atrial fibrillation: Secondary | ICD-10-CM | POA: Diagnosis not present

## 2021-12-26 DIAGNOSIS — Z9911 Dependence on respirator [ventilator] status: Secondary | ICD-10-CM | POA: Diagnosis not present

## 2021-12-26 DIAGNOSIS — R1311 Dysphagia, oral phase: Secondary | ICD-10-CM | POA: Diagnosis not present

## 2021-12-31 DIAGNOSIS — I82402 Acute embolism and thrombosis of unspecified deep veins of left lower extremity: Secondary | ICD-10-CM | POA: Diagnosis not present

## 2021-12-31 DIAGNOSIS — K9423 Gastrostomy malfunction: Secondary | ICD-10-CM | POA: Diagnosis not present

## 2021-12-31 DIAGNOSIS — I5023 Acute on chronic systolic (congestive) heart failure: Secondary | ICD-10-CM | POA: Diagnosis not present

## 2021-12-31 DIAGNOSIS — I480 Paroxysmal atrial fibrillation: Secondary | ICD-10-CM | POA: Diagnosis not present

## 2021-12-31 DIAGNOSIS — J449 Chronic obstructive pulmonary disease, unspecified: Secondary | ICD-10-CM | POA: Diagnosis not present

## 2021-12-31 DIAGNOSIS — Z430 Encounter for attention to tracheostomy: Secondary | ICD-10-CM | POA: Diagnosis not present

## 2021-12-31 DIAGNOSIS — G4733 Obstructive sleep apnea (adult) (pediatric): Secondary | ICD-10-CM | POA: Diagnosis not present

## 2021-12-31 DIAGNOSIS — Z930 Tracheostomy status: Secondary | ICD-10-CM | POA: Diagnosis not present

## 2021-12-31 DIAGNOSIS — R1310 Dysphagia, unspecified: Secondary | ICD-10-CM | POA: Diagnosis not present

## 2021-12-31 DIAGNOSIS — R911 Solitary pulmonary nodule: Secondary | ICD-10-CM | POA: Diagnosis not present

## 2021-12-31 DIAGNOSIS — Z9911 Dependence on respirator [ventilator] status: Secondary | ICD-10-CM | POA: Diagnosis not present

## 2021-12-31 DIAGNOSIS — Z9981 Dependence on supplemental oxygen: Secondary | ICD-10-CM | POA: Diagnosis not present

## 2021-12-31 DIAGNOSIS — J9620 Acute and chronic respiratory failure, unspecified whether with hypoxia or hypercapnia: Secondary | ICD-10-CM | POA: Diagnosis not present

## 2021-12-31 DIAGNOSIS — G4089 Other seizures: Secondary | ICD-10-CM | POA: Diagnosis not present

## 2021-12-31 DIAGNOSIS — R1311 Dysphagia, oral phase: Secondary | ICD-10-CM | POA: Diagnosis not present

## 2021-12-31 DIAGNOSIS — G894 Chronic pain syndrome: Secondary | ICD-10-CM | POA: Diagnosis not present

## 2021-12-31 DIAGNOSIS — F419 Anxiety disorder, unspecified: Secondary | ICD-10-CM | POA: Diagnosis not present

## 2021-12-31 DIAGNOSIS — Z431 Encounter for attention to gastrostomy: Secondary | ICD-10-CM | POA: Diagnosis not present

## 2022-01-04 ENCOUNTER — Inpatient Hospital Stay (HOSPITAL_COMMUNITY)
Admission: EM | Admit: 2022-01-04 | Discharge: 2022-01-06 | DRG: 166 | Disposition: A | Discharge status: Nursing Home (Includes ICF) | Payer: Medicare Other | Source: Skilled Nursing Facility | Attending: Internal Medicine | Admitting: Internal Medicine

## 2022-01-04 ENCOUNTER — Emergency Department (HOSPITAL_COMMUNITY): Payer: Medicare Other

## 2022-01-04 ENCOUNTER — Observation Stay (HOSPITAL_COMMUNITY): Payer: Medicare Other

## 2022-01-04 DIAGNOSIS — E872 Acidosis, unspecified: Secondary | ICD-10-CM | POA: Diagnosis present

## 2022-01-04 DIAGNOSIS — D649 Anemia, unspecified: Secondary | ICD-10-CM | POA: Diagnosis present

## 2022-01-04 DIAGNOSIS — G40909 Epilepsy, unspecified, not intractable, without status epilepticus: Secondary | ICD-10-CM | POA: Diagnosis present

## 2022-01-04 DIAGNOSIS — E039 Hypothyroidism, unspecified: Secondary | ICD-10-CM | POA: Diagnosis not present

## 2022-01-04 DIAGNOSIS — J9503 Malfunction of tracheostomy stoma: Secondary | ICD-10-CM | POA: Diagnosis not present

## 2022-01-04 DIAGNOSIS — G250 Essential tremor: Secondary | ICD-10-CM | POA: Diagnosis present

## 2022-01-04 DIAGNOSIS — Z9911 Dependence on respirator [ventilator] status: Secondary | ICD-10-CM | POA: Diagnosis not present

## 2022-01-04 DIAGNOSIS — Z4682 Encounter for fitting and adjustment of non-vascular catheter: Secondary | ICD-10-CM | POA: Diagnosis not present

## 2022-01-04 DIAGNOSIS — I5032 Chronic diastolic (congestive) heart failure: Secondary | ICD-10-CM | POA: Diagnosis present

## 2022-01-04 DIAGNOSIS — J9690 Respiratory failure, unspecified, unspecified whether with hypoxia or hypercapnia: Secondary | ICD-10-CM | POA: Diagnosis not present

## 2022-01-04 DIAGNOSIS — I9589 Other hypotension: Secondary | ICD-10-CM | POA: Diagnosis not present

## 2022-01-04 DIAGNOSIS — I2510 Atherosclerotic heart disease of native coronary artery without angina pectoris: Secondary | ICD-10-CM | POA: Diagnosis present

## 2022-01-04 DIAGNOSIS — J9621 Acute and chronic respiratory failure with hypoxia: Secondary | ICD-10-CM | POA: Diagnosis not present

## 2022-01-04 DIAGNOSIS — E875 Hyperkalemia: Secondary | ICD-10-CM | POA: Diagnosis present

## 2022-01-04 DIAGNOSIS — G9340 Encephalopathy, unspecified: Secondary | ICD-10-CM

## 2022-01-04 DIAGNOSIS — Z8249 Family history of ischemic heart disease and other diseases of the circulatory system: Secondary | ICD-10-CM

## 2022-01-04 DIAGNOSIS — G20A1 Parkinson's disease without dyskinesia, without mention of fluctuations: Secondary | ICD-10-CM | POA: Diagnosis present

## 2022-01-04 DIAGNOSIS — Z79899 Other long term (current) drug therapy: Secondary | ICD-10-CM

## 2022-01-04 DIAGNOSIS — R739 Hyperglycemia, unspecified: Secondary | ICD-10-CM | POA: Diagnosis present

## 2022-01-04 DIAGNOSIS — Z681 Body mass index (BMI) 19 or less, adult: Secondary | ICD-10-CM | POA: Diagnosis not present

## 2022-01-04 DIAGNOSIS — E43 Unspecified severe protein-calorie malnutrition: Secondary | ICD-10-CM | POA: Diagnosis present

## 2022-01-04 DIAGNOSIS — I480 Paroxysmal atrial fibrillation: Secondary | ICD-10-CM | POA: Diagnosis present

## 2022-01-04 DIAGNOSIS — Z8701 Personal history of pneumonia (recurrent): Secondary | ICD-10-CM

## 2022-01-04 DIAGNOSIS — K9429 Other complications of gastrostomy: Secondary | ICD-10-CM | POA: Diagnosis present

## 2022-01-04 DIAGNOSIS — J9622 Acute and chronic respiratory failure with hypercapnia: Secondary | ICD-10-CM | POA: Diagnosis not present

## 2022-01-04 DIAGNOSIS — F419 Anxiety disorder, unspecified: Secondary | ICD-10-CM | POA: Diagnosis present

## 2022-01-04 DIAGNOSIS — R627 Adult failure to thrive: Secondary | ICD-10-CM | POA: Diagnosis present

## 2022-01-04 DIAGNOSIS — M8580 Other specified disorders of bone density and structure, unspecified site: Secondary | ICD-10-CM | POA: Diagnosis present

## 2022-01-04 DIAGNOSIS — R29818 Other symptoms and signs involving the nervous system: Secondary | ICD-10-CM | POA: Diagnosis not present

## 2022-01-04 DIAGNOSIS — Z4659 Encounter for fitting and adjustment of other gastrointestinal appliance and device: Secondary | ICD-10-CM | POA: Diagnosis not present

## 2022-01-04 DIAGNOSIS — R0602 Shortness of breath: Secondary | ICD-10-CM | POA: Diagnosis not present

## 2022-01-04 DIAGNOSIS — K9423 Gastrostomy malfunction: Secondary | ICD-10-CM | POA: Diagnosis present

## 2022-01-04 DIAGNOSIS — G928 Other toxic encephalopathy: Secondary | ICD-10-CM | POA: Diagnosis present

## 2022-01-04 DIAGNOSIS — E876 Hypokalemia: Secondary | ICD-10-CM | POA: Diagnosis not present

## 2022-01-04 DIAGNOSIS — R569 Unspecified convulsions: Principal | ICD-10-CM

## 2022-01-04 DIAGNOSIS — Z79890 Hormone replacement therapy (postmenopausal): Secondary | ICD-10-CM

## 2022-01-04 DIAGNOSIS — Z888 Allergy status to other drugs, medicaments and biological substances status: Secondary | ICD-10-CM

## 2022-01-04 DIAGNOSIS — Z1152 Encounter for screening for COVID-19: Secondary | ICD-10-CM

## 2022-01-04 DIAGNOSIS — Z825 Family history of asthma and other chronic lower respiratory diseases: Secondary | ICD-10-CM

## 2022-01-04 DIAGNOSIS — D72829 Elevated white blood cell count, unspecified: Secondary | ICD-10-CM | POA: Diagnosis present

## 2022-01-04 DIAGNOSIS — Z820 Family history of epilepsy and other diseases of the nervous system: Secondary | ICD-10-CM

## 2022-01-04 DIAGNOSIS — J9601 Acute respiratory failure with hypoxia: Secondary | ICD-10-CM | POA: Diagnosis present

## 2022-01-04 DIAGNOSIS — J449 Chronic obstructive pulmonary disease, unspecified: Secondary | ICD-10-CM | POA: Diagnosis not present

## 2022-01-04 DIAGNOSIS — J9620 Acute and chronic respiratory failure, unspecified whether with hypoxia or hypercapnia: Secondary | ICD-10-CM | POA: Diagnosis present

## 2022-01-04 DIAGNOSIS — I447 Left bundle-branch block, unspecified: Secondary | ICD-10-CM | POA: Diagnosis present

## 2022-01-04 DIAGNOSIS — Z823 Family history of stroke: Secondary | ICD-10-CM

## 2022-01-04 DIAGNOSIS — R9431 Abnormal electrocardiogram [ECG] [EKG]: Secondary | ICD-10-CM | POA: Diagnosis not present

## 2022-01-04 DIAGNOSIS — Z87440 Personal history of urinary (tract) infections: Secondary | ICD-10-CM

## 2022-01-04 LAB — MRSA NEXT GEN BY PCR, NASAL: MRSA by PCR Next Gen: NOT DETECTED

## 2022-01-04 LAB — BLOOD GAS, ARTERIAL
Acid-Base Excess: 4.2 mmol/L — ABNORMAL HIGH (ref 0.0–2.0)
Bicarbonate: 37.8 mmol/L — ABNORMAL HIGH (ref 20.0–28.0)
Drawn by: 55062
O2 Saturation: 100 %
Patient temperature: 37
pCO2 arterial: 119 mmHg (ref 32–48)
pH, Arterial: 7.11 — CL (ref 7.35–7.45)
pO2, Arterial: 276 mmHg — ABNORMAL HIGH (ref 83–108)

## 2022-01-04 LAB — PROTIME-INR
INR: 1.3 — ABNORMAL HIGH (ref 0.8–1.2)
Prothrombin Time: 15.8 seconds — ABNORMAL HIGH (ref 11.4–15.2)

## 2022-01-04 LAB — DIFFERENTIAL
Abs Immature Granulocytes: 0.13 10*3/uL — ABNORMAL HIGH (ref 0.00–0.07)
Basophils Absolute: 0.1 10*3/uL (ref 0.0–0.1)
Basophils Relative: 0 %
Eosinophils Absolute: 0 10*3/uL (ref 0.0–0.5)
Eosinophils Relative: 0 %
Immature Granulocytes: 1 %
Lymphocytes Relative: 3 %
Lymphs Abs: 0.7 10*3/uL (ref 0.7–4.0)
Monocytes Absolute: 1.2 10*3/uL — ABNORMAL HIGH (ref 0.1–1.0)
Monocytes Relative: 5 %
Neutro Abs: 19.7 10*3/uL — ABNORMAL HIGH (ref 1.7–7.7)
Neutrophils Relative %: 91 %

## 2022-01-04 LAB — PROCALCITONIN: Procalcitonin: 0.19 ng/mL

## 2022-01-04 LAB — COMPREHENSIVE METABOLIC PANEL
ALT: 35 U/L (ref 0–44)
AST: 31 U/L (ref 15–41)
Albumin: 2.8 g/dL — ABNORMAL LOW (ref 3.5–5.0)
Alkaline Phosphatase: 155 U/L — ABNORMAL HIGH (ref 38–126)
Anion gap: 7 (ref 5–15)
BUN: 25 mg/dL — ABNORMAL HIGH (ref 8–23)
CO2: 34 mmol/L — ABNORMAL HIGH (ref 22–32)
Calcium: 9.6 mg/dL (ref 8.9–10.3)
Chloride: 97 mmol/L — ABNORMAL LOW (ref 98–111)
Creatinine, Ser: 0.71 mg/dL (ref 0.61–1.24)
GFR, Estimated: >60 mL/min (ref >60)
Glucose, Bld: 330 mg/dL — ABNORMAL HIGH (ref 70–99)
Potassium: 5.7 mmol/L — ABNORMAL HIGH (ref 3.5–5.1)
Sodium: 138 mmol/L (ref 135–145)
Total Bilirubin: 0.3 mg/dL (ref 0.3–1.2)
Total Protein: 9.1 g/dL — ABNORMAL HIGH (ref 6.5–8.1)

## 2022-01-04 LAB — ETHANOL: Alcohol, Ethyl (B): <10 mg/dL (ref <10)

## 2022-01-04 LAB — CBC
HCT: 40.3 % (ref 39.0–52.0)
Hemoglobin: 11.9 g/dL — ABNORMAL LOW (ref 13.0–17.0)
MCH: 27.1 pg (ref 26.0–34.0)
MCHC: 29.5 g/dL — ABNORMAL LOW (ref 30.0–36.0)
MCV: 91.8 fL (ref 80.0–100.0)
Platelets: 440 10*3/uL — ABNORMAL HIGH (ref 150–400)
RBC: 4.39 MIL/uL (ref 4.22–5.81)
RDW: 19 % — ABNORMAL HIGH (ref 11.5–15.5)
WBC: 21.9 10*3/uL — ABNORMAL HIGH (ref 4.0–10.5)
nRBC: 0 % (ref 0.0–0.2)

## 2022-01-04 LAB — APTT: aPTT: 40 seconds — ABNORMAL HIGH (ref 24–36)

## 2022-01-04 LAB — GLUCOSE, CAPILLARY
Glucose-Capillary: 154 mg/dL — ABNORMAL HIGH (ref 70–99)
Glucose-Capillary: 212 mg/dL — ABNORMAL HIGH (ref 70–99)

## 2022-01-04 LAB — CBG MONITORING, ED: Glucose-Capillary: 285 mg/dL — ABNORMAL HIGH (ref 70–99)

## 2022-01-04 MED ORDER — POLYETHYLENE GLYCOL 3350 17 G PO PACK: 17.0000 g | PACK | Freq: Every day | ORAL | Status: DC | PRN

## 2022-01-04 MED ORDER — FAMOTIDINE 20 MG PO TABS
20.0000 mg | ORAL_TABLET | Freq: Two times a day (BID) | ORAL | Status: DC
  Administered 2022-01-04 – 2022-01-06 (×4): 20 mg
  Filled 2022-01-04 (×4): qty 1

## 2022-01-04 MED ORDER — LEVETIRACETAM 100 MG/ML PO SOLN
1500.0000 mg | Freq: Two times a day (BID) | ORAL | Status: DC
  Administered 2022-01-04 – 2022-01-06 (×4): 1500 mg
  Filled 2022-01-04 (×5): qty 15

## 2022-01-04 MED ORDER — SODIUM CHLORIDE 0.9 % IV SOLN
500.0000 mg | INTRAVENOUS | Status: DC
  Administered 2022-01-04: 500 mg via INTRAVENOUS
  Filled 2022-01-04 (×2): qty 5

## 2022-01-04 MED ORDER — SODIUM CHLORIDE 0.9% FLUSH
3.0000 mL | Freq: Once | INTRAVENOUS | Status: AC
  Administered 2022-01-04: 3 mL via INTRAVENOUS

## 2022-01-04 MED ORDER — MIDODRINE HCL 5 MG PO TABS
10.0000 mg | ORAL_TABLET | Freq: Three times a day (TID) | ORAL | Status: DC
  Administered 2022-01-04 – 2022-01-06 (×4): 10 mg
  Filled 2022-01-04 (×4): qty 2

## 2022-01-04 MED ORDER — ORAL CARE MOUTH RINSE: 15.0000 mL | OROMUCOSAL | Status: DC | PRN

## 2022-01-04 MED ORDER — ORAL CARE MOUTH RINSE
15.0000 mL | OROMUCOSAL | Status: DC
  Administered 2022-01-05 – 2022-01-06 (×21): 15 mL via OROMUCOSAL

## 2022-01-04 MED ORDER — LEVALBUTEROL HCL 1.25 MG/0.5ML IN NEBU
1.2500 mg | INHALATION_SOLUTION | Freq: Four times a day (QID) | RESPIRATORY_TRACT | Status: DC
  Administered 2022-01-04 – 2022-01-06 (×8): 1.25 mg via RESPIRATORY_TRACT
  Filled 2022-01-04 (×9): qty 0.5

## 2022-01-04 MED ORDER — SODIUM CHLORIDE 0.9 % IV SOLN
1.0000 g | Freq: Three times a day (TID) | INTRAVENOUS | Status: DC
  Administered 2022-01-04 – 2022-01-06 (×6): 1 g via INTRAVENOUS
  Filled 2022-01-04 (×8): qty 20

## 2022-01-04 MED ORDER — INSULIN ASPART 100 UNIT/ML IJ SOLN
0.0000 [IU] | INTRAMUSCULAR | Status: DC
  Administered 2022-01-05: 2 [IU] via SUBCUTANEOUS
  Administered 2022-01-06 (×2): 1 [IU] via SUBCUTANEOUS

## 2022-01-04 MED ORDER — CHLORHEXIDINE GLUCONATE CLOTH 2 % EX PADS
6.0000 | MEDICATED_PAD | Freq: Every day | CUTANEOUS | Status: DC
  Administered 2022-01-04 – 2022-01-05 (×2): 6 via TOPICAL

## 2022-01-04 MED ORDER — FOLIC ACID 1 MG PO TABS
1.0000 mg | ORAL_TABLET | Freq: Every day | ORAL | Status: DC
  Administered 2022-01-04 – 2022-01-06 (×3): 1 mg
  Filled 2022-01-04 (×3): qty 1

## 2022-01-04 MED ORDER — SODIUM ZIRCONIUM CYCLOSILICATE 10 G PO PACK
10.0000 g | PACK | Freq: Once | ORAL | Status: AC
  Administered 2022-01-04: 10 g via ORAL
  Filled 2022-01-04: qty 1

## 2022-01-04 MED ORDER — DOCUSATE SODIUM 100 MG PO CAPS: 100.0000 mg | ORAL_CAPSULE | Freq: Two times a day (BID) | ORAL | Status: DC | PRN

## 2022-01-04 MED ORDER — LEVOTHYROXINE SODIUM 25 MCG PO TABS
25.0000 ug | ORAL_TABLET | Freq: Every day | ORAL | Status: DC
  Administered 2022-01-04 – 2022-01-06 (×3): 25 ug
  Filled 2022-01-04 (×3): qty 1

## 2022-01-04 MED ORDER — SERTRALINE HCL 25 MG PO TABS
25.0000 mg | ORAL_TABLET | Freq: Every day | ORAL | Status: DC
  Administered 2022-01-04 – 2022-01-06 (×3): 25 mg
  Filled 2022-01-04 (×3): qty 1

## 2022-01-04 MED ORDER — VANCOMYCIN HCL IN DEXTROSE 1-5 GM/200ML-% IV SOLN
1000.0000 mg | INTRAVENOUS | Status: DC
  Administered 2022-01-04: 1000 mg via INTRAVENOUS
  Filled 2022-01-04: qty 200

## 2022-01-04 MED ORDER — CHLORHEXIDINE GLUCONATE CLOTH 2 % EX PADS: 6.0000 | MEDICATED_PAD | Freq: Every day | CUTANEOUS | Status: DC

## 2022-01-04 MED ORDER — LORAZEPAM 2 MG/ML IJ SOLN: 2.0000 mg | INTRAMUSCULAR | Status: DC | PRN

## 2022-01-04 MED ORDER — HEPARIN SODIUM (PORCINE) 5000 UNIT/ML IJ SOLN
5000.0000 [IU] | Freq: Three times a day (TID) | INTRAMUSCULAR | Status: DC
  Filled 2022-01-04: qty 1

## 2022-01-04 MED ORDER — INSULIN ASPART 100 UNIT/ML IJ SOLN
0.0000 [IU] | INTRAMUSCULAR | Status: DC
  Administered 2022-01-04: 3 [IU] via SUBCUTANEOUS

## 2022-01-04 MED ORDER — ENOXAPARIN SODIUM 30 MG/0.3ML IJ SOSY
30.0000 mg | PREFILLED_SYRINGE | INTRAMUSCULAR | Status: DC
  Administered 2022-01-05 – 2022-01-06 (×2): 30 mg via SUBCUTANEOUS
  Filled 2022-01-04 (×2): qty 0.3

## 2022-01-04 MED ORDER — FAMOTIDINE 20 MG PO TABS: 20.0000 mg | ORAL_TABLET | Freq: Two times a day (BID) | ORAL | Status: DC

## 2022-01-04 NOTE — Progress Notes (Signed)
eLink Physician-Brief Progress Note Patient Name: Austin Davis DOB: 05-16-1940 MRN: [id number]   Date of Service  01/04/2022  HPI/Events of Note  62 M hx of afib, seizures, hypothyroidism, Parkinson's s/p trach, has IVF filter in place. He was brought in from [facility] due to seizures and was post ictal. ABG with acute on chronic hypercapnea. Lurline Idol was reportedly dislodged improved. CXR with bilateral infiltrates/atelectasis. Head CT without acute findings.   eICU Interventions  Acute on chronic hypercapnic and hypoxemic respiratory failure, started on broad spectrum antibiotics. Will in and out cath to get urine samples given history of recurrent UTI. Seizure likely secondary to above as well as infection Hyperkalemia given Lokelma. eLink to be informed once repeat labs resulted On famotidine for SUP, will start enoxaparin for VTE prophylaxis as head CT negative for bleed Patient is full code     Intervention Category Evaluation Type: New Patient Evaluation  Judd Lien 01/04/2022, 11:19 PM

## 2022-01-04 NOTE — Progress Notes (Signed)
Patient transported to CT and then to 3M07 via the ventilator with no complications.

## 2022-01-04 NOTE — ED Triage Notes (Signed)
Pt bib [facility] form [facility] due to questionable seizure activity and he was unresponsive. Possible stroke.

## 2022-01-04 NOTE — ED Provider Notes (Signed)
[facility] EMERGENCY DEPARTMENT Provider Note   CSN: [id number] Arrival date & time: 01/04/22  1905     History  No chief complaint on file.   Austin Davis is a 81 y.o. male.  81 year old male no who comes from long-term care facility who is chronically vent dependent after having possible seizure activity.  Patient normally has absent seizures but apparently had generalized tonic-clonic.  On arrival, patient was thought to have aspirated due to having emesis prior to arrival here.  Patient was seen by neurology and patient was hypoxic while trying to obtain a head CT.  Patient emergently brought to a treatment room       Home Medications Prior to Admission medications   Medication Sig Start Date End Date Taking? Authorizing Provider  acetaminophen (TYLENOL) 325 MG tablet Take 650 mg by mouth every 8 (eight) hours as needed for mild pain or fever.    [provider]  ALPRAZolam Duanne Moron) 0.5 MG tablet Place 0.5 mg into feeding tube every 8 (eight) hours as needed for anxiety.    [provider]  esomeprazole (NEXIUM) 40 MG capsule Take 40 mg by mouth daily. Via feeding tube    [provider]  folic acid (FOLVITE) 1 MG tablet Take 1 tablet (1 mg total) by mouth daily. Patient taking differently: Place 1 mg into feeding tube daily. 09/29/20   [name], MD  furosemide (LASIX) 20 MG tablet Place 1 tablet (20 mg total) into feeding tube daily as needed for fluid or edema. Patient taking differently: Place 20 mg into feeding tube daily. 07/03/21   Mercy Riding, MD  hydrOXYzine (ATARAX) 25 MG tablet Place 25 mg into feeding tube every 6 (six) hours as needed for anxiety.    [provider]  Lactobacillus-Inulin ([place]) CAPS Give 1 capsule by tube daily. 10 billion    [provider]  lactulose (CHRONULAC) 10 GM/15ML solution Place 20 g into feeding tube every 12 (twelve) hours as needed  (constipation).    [provider]  levalbuterol Penne Lash) 1.25 MG/3ML nebulizer solution Take 1.25 mg by nebulization every 6 (six) hours.    [provider]  levETIRAcetam (KEPPRA) 100 MG/ML solution Place 1,500 mg into feeding tube in the morning and at bedtime.    [provider]  levothyroxine (SYNTHROID) 25 MCG tablet Place 25 mcg into feeding tube daily.    [provider]  LORazepam (ATIVAN) 2 MG/ML injection 2 mg as needed (seizures).    [provider]  midodrine (PROAMATINE) 10 MG tablet Place 1 tablet (10 mg total) into feeding tube 3 (three) times daily with meals. Patient taking differently: Place 10 mg into feeding tube 3 (three) times daily. 05/01/21   Thurnell Lose, MD  Multiple Vitamin (MULTIVITAMIN WITH MINERALS) TABS tablet Place 1 tablet into feeding tube daily. 05/11/21   Raiford Noble Latif, DO  Nutritional Supplements (FEEDING SUPPLEMENT, JEVITY 1.5 CAL/FIBER,) LIQD Place 1,000 mLs into feeding tube continuous. Patient not taking: Reported on 08/30/2021 05/10/21   Raiford Noble Latif, DO  Nutritional Supplements (FEEDING SUPPLEMENT, OSMOLITE 1.5 CAL,) LIQD Place 55 mL/hr into feeding tube See admin instructions. 55 ml/hr for 22-24 hours = 1815-1980 kcal & 1520-1603 ml    [provider]  Omega-3 Fatty Acids (FISH OIL) 1000 MG CAPS Take 1,000 mg by mouth daily.    [provider]  polyethylene glycol (MIRALAX / GLYCOLAX) 17 g packet Place 17 g into feeding tube  every other day.    [provider]  Probiotic Product (CULTURELLE PROBIOTICS PO) Give 1 capsule by tube daily.    [provider]  propranolol (INDERAL) 10 MG tablet Place 1 tablet (10 mg total) into feeding tube daily as needed (for tremors in AM. do not administer if heart rate is below 60 or systolic blood pressure is below 90.). Patient taking differently: Place 10 mg into feeding tube See admin instructions. 10 mg once daly as needed  for tremors, HOLD if heart rate is below 60 or systolic blood pressure is below 90. 05/01/21   Singh, Margaree Mackintosh, MD  sertraline (ZOLOFT) 25 MG tablet Place 25 mg into feeding tube daily.    [provider]  Soap & Cleansers (CETAPHIL GENTLE CLEANSER EX) Apply 10 mLs topically daily.    [provider]  tamsulosin (FLOMAX) 0.4 MG CAPS capsule Take 0.4 mg by mouth daily. Via feeding tube    [provider]  Water For Irrigation, Sterile (STERILE WATER FOR IRRIGATION) Irrigate with 100 mLs as directed See admin instructions. 100 ml H2O flush every 4 hours    [provider]      Allergies    Prednisone and Cortisone    Review of Systems   Review of Systems  Unable to perform ROS: Mental status change    Physical Exam Updated Vital Signs There were no vitals taken for this visit. Physical Exam Vitals and nursing note reviewed.  Constitutional:      General: He is in acute distress.     Appearance: He is cachectic. He is not toxic-appearing.  HENT:     Head: Normocephalic and atraumatic.  Eyes:     General: Lids are normal.     Conjunctiva/sclera: Conjunctivae normal.     Pupils: Pupils are equal, round, and reactive to light.  Neck:     Thyroid: No thyroid mass.     Trachea: No tracheal deviation.  Cardiovascular:     Rate and Rhythm: Normal rate and regular rhythm.     Heart sounds: Normal heart sounds. No murmur heard.    No gallop.  Pulmonary:     Effort: Prolonged expiration and respiratory distress present.     Breath sounds: No stridor. Decreased breath sounds present. No wheezing, rhonchi or rales.  Abdominal:     General: There is no distension.     Palpations: Abdomen is soft.     Tenderness: There is no abdominal tenderness. There is no rebound.  Musculoskeletal:        General: No tenderness. Normal range of motion.     Cervical back: Normal range of motion and neck supple.  Skin:    General: Skin is warm and dry.      Findings: No abrasion or rash.  Neurological:     Mental Status: He is unresponsive.     Cranial Nerves: No cranial nerve deficit.     Sensory: No sensory deficit.     Motor: No tremor.  Psychiatric:        Attention and Perception: He is inattentive.     ED Results / Procedures / Treatments   Labs (all labs ordered are listed, but only abnormal results are displayed) Labs Reviewed  PROTIME-INR  APTT  CBC  DIFFERENTIAL  COMPREHENSIVE METABOLIC PANEL  ETHANOL  I-STAT CHEM 8, ED  CBG MONITORING, ED    EKG None  Radiology No results found.  Procedures Procedures    Medications Ordered in ED Medications  sodium chloride flush (NS) 0.9 % injection 3 mL (has no administration in time range)    ED Course/ Medical Decision Making/ A&P                           Medical Decision Making Amount and/or Complexity of Data Reviewed Radiology: ordered.  Patient with history of vomiting and hypoxemia prior to arrival.  Concern for possible aspiration pneumonia.  Was difficult to ventilate on the ventilator.  Was suctioned multiple times.  Urgent consult to critical care was made and they are at the bedside at this time.  Patient now is pulling adequate tidal volumes.  Patient will still need to have CT scan of the head to rule out intracranial hemorrhage.  Critical care has now assumed care of the patient Patient's chest x-ray per my interpretation was negative for pneumothorax.         Final Clinical Impression(s) / ED Diagnoses Final diagnoses:  None    Rx / DC Orders ED Discharge Orders     None         Lacretia Leigh, MD 01/04/22 1945

## 2022-01-04 NOTE — Progress Notes (Signed)
Pharmacy Antibiotic Note  Austin Davis is a 81 y.o. male admitted on 01/04/2022 with pneumonia.  Pharmacy has been consulted for vancomycin and meropenem dosing.  Plan: Start IV vancomycin 1000mg  Q24H, eAUC:  439 using TBW (patient weight less than IBW) and Scr rounded to 0.80.  Meropenem 1G Q8H.  Follow culture data for de-escalation.  Monitor renal function for dose adjustments as indicated.   Height: 5\' 10"  (177.8 cm) Weight: 58 kg (127 lb 13.9 oz) IBW/kg (Calculated) : 73  No data recorded.  Recent Labs  Lab 01/04/22 1940  WBC 21.9*  CREATININE 0.71    Estimated Creatinine Clearance: 60.4 mL/min (by C-G formula based on SCr of 0.71 mg/dL).    Allergies  Allergen Reactions   Prednisone Other (See Comments)    Listed as allergy on MAR Unknown reaction   Cortisone Other (See Comments)    Listed as allergy per MAR Unknown reaction    Thank you for allowing pharmacy to be a part of this patient's care.  Esmeralda Arthur, PharmD, BCCCP  01/04/2022 8:38 PM

## 2022-01-04 NOTE — Code Documentation (Signed)
Stroke Response Nurse Documentation Code Documentation  MARRION ACCOMANDO is a 81 y.o. male arriving to [facility]  via [place] on 01-04-22 with past medical hx of afib, seizures, trach dependent. On No antithrombotic. Code stroke was activated by EMS.   Patient from [place] with [facility] approximately 1800 when he had a seizure and then was unresponsive. Patient with history of episodes where he stares into space in setting of infections.   Stroke team at the bedside on patient arrival. Labs drawn and patient cleared for CT by EDP. Patient to CT with team. NIHSS 24, see documentation for details and code stroke times. Patient with decreased LOC, disoriented, not following commands, bilateral arm weakness, bilateral leg weakness, bilateral decreased sensation, Global aphasia , and dysarthria  on exam.   Care Plan: cancel code stroke as seizure suspected. Work up per [employer] and EDP   Bedside handoff with ED Marine scientist.    Candace Cruise K  Stroke Response RN

## 2022-01-04 NOTE — Consult Note (Addendum)
Neurology Consultation  Reason for Consult: Unresponsiveness, concern for seizure given history of seizures Referring Physician: Dr. Lacretia Leigh  CC: Unresponsiveness-code stroke activated by [facility] who were called for patient transported at [facility]  History is obtained from: Chart, EMS  HPI: Austin Davis is a 81 y.o. male past medical history of paroxysmal A-fib not on anticoagulation, presumed seizures on Keppra presenting from his facility [facility] for unresponsiveness.  Last known well around 6 PM when he was noted to be unresponsive.  Unclear what his baseline is at the facility recently but from the most recent neurological consultation note of June 2023 he appeared to be awake alert attempted to mild his name, follows simple commands without any facial symmetry and was able to move all his extremities antigravity.  He had a significant jaw tremor as well as bilateral upper extremity tremors at baseline.  Seizures were considered because of episodes of staring off in space and worsening seen multiple times in the setting of metabolic derangements and infections. Unclear history of whether he was sick prior to this presentation at the facility.  According to the transfer team, when they arrived at the facility, he was saturating at 100% but unresponsive.  His pupils were sluggishly reactive.  On the way, they had a hard time ventilating him.  His saturations dropped up to 70s.  He remained unresponsive.   LKW: Presumably 6 PM as noted by EMS but that was the time when symptoms are recognized.  I have not been able to reach the facility to find out what the exact last known well was. IV thrombolysis given?:  No focal findings EVT: Modified Rankin precludes EVT Premorbid modified Rankin scale (mRS):5  ROS: Unable to obtain due to altered mental status.   Past Medical History:  Diagnosis Date   CHF (congestive heart failure) (HCC)    Coronary artery disease    Essential tremor     GI bleed 2019   hospitalized at [facility] for one week   Headache    since childhood   Low blood sugar    since childhood, controlled by diet   Mitral regurgitation    Mitral valve prolapse    Osteopenia 2021   Paroxysmal atrial fibrillation (HCC)    Tremor      Family History  Problem Relation Age of Onset   Heart disease Mother    CAD Mother    Migraines Mother    Heart failure Father    Skin cancer Father    Valvular heart disease Father        prolapsed valve   Tremor Father        "probably had a bit of tremor in his old age"   Heart failure Maternal Grandmother    Pneumonia Maternal Grandfather    Heart failure Paternal Grandmother    Stroke Paternal Grandfather    Asthma Daughter    Epilepsy Son    Parkinson's disease Neg Hx      Social History:   reports that he has never smoked. He has never used smokeless tobacco. He reports current alcohol use. He reports that he does not use drugs.  Medications  Current Facility-Administered Medications:    famotidine (PEPCID) tablet 20 mg, 20 mg, Per Tube, BID, Rollene Rotunda, John D, PA-C   folic acid (FOLVITE) tablet 1 mg, 1 mg, Per Tube, Daily, Andres Labrum D, PA-C   insulin aspart (novoLOG) injection 0-6 Units, 0-6 Units, Subcutaneous, Q4H, Mick Sell, PA-C  levalbuterol (XOPENEX) nebulizer solution 1.25 mg, 1.25 mg, Nebulization, Q6H, Payne, John D, PA-C   levETIRAcetam (KEPPRA) 100 MG/ML solution 1,500 mg, 1,500 mg, Per Tube, BID, Rollene Rotunda, John D, PA-C   levothyroxine (SYNTHROID) tablet 25 mcg, 25 mcg, Per Tube, Daily, Rollene Rotunda, John D, PA-C   LORazepam (ATIVAN) injection 2 mg, 2 mg, Intravenous, Q4H PRN, Andres Labrum D, PA-C   midodrine (PROAMATINE) tablet 10 mg, 10 mg, Per Tube, Q8H, Andres Labrum D, PA-C   sertraline (ZOLOFT) tablet 25 mg, 25 mg, Per Tube, Daily, Mick Sell, PA-C  Current Outpatient Medications:    acetaminophen (TYLENOL) 325 MG tablet, Take 650 mg by mouth every 8 (eight) hours as needed for mild  pain or fever., Disp: , Rfl:    ALPRAZolam (XANAX) 0.5 MG tablet, Place 0.5 mg into feeding tube every 8 (eight) hours as needed for anxiety., Disp: , Rfl:    esomeprazole (NEXIUM) 40 MG capsule, Take 40 mg by mouth daily. Via feeding tube, Disp: , Rfl:    folic acid (FOLVITE) 1 MG tablet, Take 1 tablet (1 mg total) by mouth daily. (Patient taking differently: Place 1 mg into feeding tube daily.), Disp: 30 tablet, Rfl: 0   furosemide (LASIX) 20 MG tablet, Place 1 tablet (20 mg total) into feeding tube daily as needed for fluid or edema. (Patient taking differently: Place 20 mg into feeding tube daily.), Disp: 30 tablet, Rfl:    hydrOXYzine (ATARAX) 25 MG tablet, Place 25 mg into feeding tube every 6 (six) hours as needed for anxiety., Disp: , Rfl:    Lactobacillus-Inulin ([place]) CAPS, Give 1 capsule by tube daily. 10 billion, Disp: , Rfl:    lactulose (CHRONULAC) 10 GM/15ML solution, Place 20 g into feeding tube every 12 (twelve) hours as needed (constipation)., Disp: , Rfl:    levalbuterol (XOPENEX) 1.25 MG/3ML nebulizer solution, Take 1.25 mg by nebulization every 6 (six) hours., Disp: , Rfl:    levETIRAcetam (KEPPRA) 100 MG/ML solution, Place 1,500 mg into feeding tube in the morning and at bedtime., Disp: , Rfl:    levothyroxine (SYNTHROID) 25 MCG tablet, Place 25 mcg into feeding tube daily., Disp: , Rfl:    LORazepam (ATIVAN) 2 MG/ML injection, 2 mg as needed (seizures)., Disp: , Rfl:    midodrine (PROAMATINE) 10 MG tablet, Place 1 tablet (10 mg total) into feeding tube 3 (three) times daily with meals. (Patient taking differently: Place 10 mg into feeding tube 3 (three) times daily.), Disp: , Rfl:    Multiple Vitamin (MULTIVITAMIN WITH MINERALS) TABS tablet, Place 1 tablet into feeding tube daily., Disp: , Rfl:    Nutritional Supplements (FEEDING SUPPLEMENT, JEVITY 1.5 CAL/FIBER,) LIQD, Place 1,000 mLs into feeding tube continuous. (Patient not taking: Reported on  08/30/2021), Disp: , Rfl:    Nutritional Supplements (FEEDING SUPPLEMENT, OSMOLITE 1.5 CAL,) LIQD, Place 55 mL/hr into feeding tube See admin instructions. 55 ml/hr for 22-24 hours = 1815-1980 kcal & 1520-1603 ml, Disp: , Rfl:    Omega-3 Fatty Acids (FISH OIL) 1000 MG CAPS, Take 1,000 mg by mouth daily., Disp: , Rfl:    polyethylene glycol (MIRALAX / GLYCOLAX) 17 g packet, Place 17 g into feeding tube every other day., Disp: , Rfl:    Probiotic Product (CULTURELLE PROBIOTICS PO), Give 1 capsule by tube daily., Disp: , Rfl:    propranolol (INDERAL) 10 MG tablet, Place 1 tablet (10 mg total) into feeding tube daily as needed (for tremors in AM. do not administer if heart rate  is below 60 or systolic blood pressure is below 90.). (Patient taking differently: Place 10 mg into feeding tube See admin instructions. 10 mg once daly as needed for tremors, HOLD if heart rate is below 60 or systolic blood pressure is below 90.), Disp: , Rfl:    sertraline (ZOLOFT) 25 MG tablet, Place 25 mg into feeding tube daily., Disp: , Rfl:    Soap & Cleansers (CETAPHIL GENTLE CLEANSER EX), Apply 10 mLs topically daily., Disp: , Rfl:    tamsulosin (FLOMAX) 0.4 MG CAPS capsule, Take 0.4 mg by mouth daily. Via feeding tube, Disp: , Rfl:    Water For Irrigation, Sterile (STERILE WATER FOR IRRIGATION), Irrigate with 100 mLs as directed See admin instructions. 100 ml H2O flush every 4 hours, Disp: , Rfl:    Exam: Current vital signs: BP (!) 126/94   Pulse 99   Resp 20   SpO2 100%  Vital signs in last 24 hours: Pulse Rate:  [99-100] 99 (12/16 1950) Resp:  [20] 20 (12/16 1950) BP: (126)/(94) 126/94 (12/16 1930) SpO2:  [100 %] 100 % (12/16 1950) General: Obtunded, trached HEENT: Normocephalic atraumatic Lungs: Distant breath sounds with no breath sounds at the bases CVS: Regular rate rhythm Neurological exam Obtunded Does not open eyes to voice or noxious stimulation To extreme noxious stimulation, mild grimace Pupils  are sluggishly nonreactive to light Corneal reflexes present No cough or gag Minimal grimace to noxious stimulation in all 4 extremities with multiple tries. No purposeful withdrawal noted.  Labs I have reviewed labs in epic and the results pertinent to this consultation are:  CBC    Component Value Date/Time   WBC 21.9 (H) 01/04/2022 1940   RBC 4.39 01/04/2022 1940   HGB 11.9 (L) 01/04/2022 1940   HGB 15.8 04/06/2020 1555   HCT 40.3 01/04/2022 1940   HCT 46.1 04/06/2020 1555   PLT 440 (H) 01/04/2022 1940   PLT 230 04/06/2020 1555   MCV 91.8 01/04/2022 1940   MCV 94 04/06/2020 1555   MCH 27.1 01/04/2022 1940   MCHC 29.5 (L) 01/04/2022 1940   RDW 19.0 (H) 01/04/2022 1940   RDW 12.8 04/06/2020 1555   LYMPHSABS 0.7 01/04/2022 1940   MONOABS 1.2 (H) 01/04/2022 1940   EOSABS 0.0 01/04/2022 1940   BASOSABS 0.1 01/04/2022 1940    CMP     Component Value Date/Time   NA 135 11/01/2021 0406   NA 143 04/06/2020 1555   K 4.2 11/01/2021 0406   CL 102 11/01/2021 0406   CO2 25 11/01/2021 0406   GLUCOSE 136 (H) 11/01/2021 0406   BUN 27 (H) 11/01/2021 0406   BUN 21 04/06/2020 1555   CREATININE 0.45 (L) 11/01/2021 0406   CREATININE 0.97 01/30/2015 1115   CALCIUM 9.0 11/01/2021 0406   PROT 8.1 10/26/2021 0819   PROT 6.7 04/06/2020 1555   ALBUMIN 3.1 (L) 10/26/2021 0819   ALBUMIN 4.7 04/06/2020 1555   AST 37 10/26/2021 0819   ALT 94 (H) 10/26/2021 0819   ALKPHOS 184 (H) 10/26/2021 0819   BILITOT 0.6 10/26/2021 0819   BILITOT 0.3 04/06/2020 1555   GFRNONAA >60 11/01/2021 0406   GFRAA >60 02/17/2017 0145    Lipid Panel     Component Value Date/Time   CHOL 180 01/30/2015 1115   TRIG 75 01/30/2015 1115   HDL 55 01/30/2015 1115   CHOLHDL 3.3 01/30/2015 1115   VLDL 15 01/30/2015 1115   LDLCALC 110 01/30/2015 1115     Imaging I have  reviewed the images obtained:  CT-head: No acute changes  ABG with significant hypercapnia  Assessment:  81 year old with above past  medical history presenting for episode of unresponsiveness.  There is presumption that he might have had a seizure although nobody witnessed seizure.  He was having a hard time ventilating when he was brought in.  He also had an episode of vomiting. Given leukocytosis, hypercapnia, difficulty with ventilation-suspect toxic metabolic encephalopathy in the setting of hypoxia and hypercapnia and underlying infection.  It is likely that seizure threshold might have been lowered in the setting of toxic metabolic derangements. He has had similar presentations with prolonged toxic metabolic encephalopathy in the setting of derangements.  I will obtain an EEG at some point but I do not think he requires ongoing code stroke assessments but requires toxic metabolic encephalopathy and seizure evaluation.    Stroke will need to be ruled out given history of atrial fibrillation but no emergent intervention is indicated given poor modified Rankin score and unclear last known well.  Impression: Toxic metabolic encephalopathy-evaluate for underlying seizures and status epilepticus.  More likely toxic metabolic encephalopathy from hypercapnia/respiratory infection.  Recommendations: Continue home antiepileptic-Keppra 1000 twice daily. Routine EEG Seizure precautions Correction of toxic metabolic derangements per primary team as you are If you determine that he is not back to baseline, consider an MRI of the brain without contrast to evaluate for any evidence of stroke given his history of atrial fibrillation not on anticoagulation. Plan was discussed with Dr. Erin Fulling from the [facility] service and Dr. Zenia Resides from the emergency medicine service. Neurology will continue to follow the EEG results with you.  After he is stabilized, and if he still not back to baseline and if MRI is positive, please reengage the neurology service for further recommendations.  -- Amie Portland, MD Neurologist [facility] Pager:  [phone]  CRITICAL CARE ATTESTATION Performed by: Amie Portland, MD Total critical care time: 50 minutes Critical care time was exclusive of separately billable procedures and treating other patients and/or supervising APPs/Residents/Students Critical care was necessary to treat or prevent imminent or life-threatening deterioration. This patient is critically ill and at significant risk for neurological worsening and/or death and care requires constant monitoring. Critical care was time spent personally by me on the following activities: development of treatment plan with patient and/or surrogate as well as nursing, discussions with consultants, evaluation of patient's response to treatment, examination of patient, obtaining history from patient or surrogate, ordering and performing treatments and interventions, ordering and review of laboratory studies, ordering and review of radiographic studies, pulse oximetry, re-evaluation of patient's condition, participation in multidisciplinary rounds and medical decision making of high complexity in the care of this patient.

## 2022-01-04 NOTE — H&P (Signed)
NAME:  EINAR NOLASCO, MRN:  [id number], DOB:  [birth year], LOS: 0 ADMISSION DATE:  01/04/2022, CONSULTATION DATE:  12/16 REFERRING MD:  Dr. Zenia Resides, CHIEF COMPLAINT:  Seizure?   History of Present Illness:  Patient is a 81 year old male with pertinent PMH of chronic respiratory failure vent dependent, tracheostomy in place, seizure, parkinsons, tremor, A-fib not on [facility] presents to [facility] ED on 12/16 for possible seizure.  Patient resides at [place] at 6 PM on 12/16.  Patient was found unresponsive.  Unknown baseline.  Last documented on last admission was able to follow commands on 10/2021.  Patient transferred to [facility] for further eval.  Upon arrival to [facility] ED on 12/16, patient remains unresponsive.  Patient hypoxic in 70s and having trouble ventilating patient with high peak airway pressures.  Patient had episode of vomiting.  CXR showing RML opacities.  Other vital stable.  ABG 7.11, 119, 276, 37.  WBC 21.  CT head on hold due to hypoxia episode.  Neuro consulted.  EEG ordered.  PCCM consulted for ICU admission.  Other pertinent labs: Hemoglobin 11.9, K5.7, glucose 330  Pertinent  Medical History   Past Medical History:  Diagnosis Date   CHF (congestive heart failure) (HCC)    Coronary artery disease    Essential tremor    GI bleed 2019   hospitalized at [facility] for one week   Headache    since childhood   Low blood sugar    since childhood, controlled by diet   Mitral regurgitation    Mitral valve prolapse    Osteopenia 2021   Paroxysmal atrial fibrillation ([place])    Tremor      Significant Hospital Events: Including procedures, antibiotic start and stop dates in addition to other pertinent events   12/16 admitted to [facility] for possible seizure activity; neuro consulted; Initially having difficulty oxygenating patient; PCCM consulted  Interim History / Subjective:  See above  Objective   Blood pressure (!) 126/94, pulse 99, resp. rate 20, SpO2 100 %.        No intake or output data in the 24 hours ending 01/04/22 1958 There were no vitals filed for this visit.  Examination: General:  chronically ill appearing trach patient on mech vent HEENT: MM pink/moist; trach in place Neuro: non responsive at baseline; pupils reactive b/l CV: s1s2, RRR, no m/r/g PULM:  dim clear BS bilaterally; on mech vent PRVC GI: soft, bsx4 active; G-tube without erythema/drainage Extremities: warm/dry, cachectic appearing Skin: no rashes or lesions appreciated  Resolved Hospital Problem list     Assessment & Plan:  Acute on chronic hypercarbic respiratory failure: vent dependent Tracheostomy in place RML consolidation: possible aspiration given vomiting episode prior to arrival; hx of recent MDR Klebsiella pneumonia P: -trach adjusted due to high peak airway pressures; peak pressures now in 20s -ABG severe resp acidosis; increase RR; repeat abg in few hours -LTVV strategy with tidal volumes of 6-8 cc/kg ideal body weight -Goal plateau pressures less than 30 and driving pressures less than 15 -Wean PEEP/FiO2 for SpO2 >92% -VAP bundle in place -meropenem/vanc/azithro  -urine legionella/strept -trach cultures  Sepsis: likely due to above P: -mero/vanc/azithro -resp cultures, bcx2, ua/uc -pct -trend LA -trend wbc/fever   Possible generalized tonic clonic seizure Acute Encephalopathy: likely hypercarbia/hypoxia related Hx of seizure disorder Parkinson disease, tremors -last documented neuro exam able to follow commands P: -neuro following; appreciate recs -vent adjustment above for hypercarbia/hypoxia -EEG and CT head pending -limit sedating meds -frequent  neuro checks -prn ativan for seizures -AEDs per neuro -hold propanolol  Hyperkalemia P: -lokelma given -check mag -trend bmp/mag  Hyperglycemia P: -check A1c -ssi and cbg monitoring  Anemia P: -trend cbc  Possible new LBBB: lots of artifact P: -repeat EKG -consider trending  troponin  Hx of paroxysmal afib -not on AC P: -rate currently controlled -telemetry monitoring  Generalized failure to thrive, chronic malnutrition P: -dietician consult; resume TF through g-tube   Hypothyroidism P: -check tsh -resume synthroid   Chronic hypotension P: -Continue midodrine   Chronic anxiety P: -hold home xanax -resume sertraline   Best Practice (right click and "Reselect all SmartList Selections" daily)   Diet/type: NPO DVT prophylaxis: SCD; ct head pending GI prophylaxis: H2B Lines: N/A Foley:  N/A Code Status:  full code Last date of multidisciplinary goals of care discussion [pending]  Labs   CBC: Recent Labs  Lab 01/04/22 1940  WBC 21.9*  NEUTROABS 19.7*  HGB 11.9*  HCT 40.3  MCV 91.8  PLT 440*    Basic Metabolic Panel: No results for input(s): "NA", "K", "CL", "CO2", "GLUCOSE", "BUN", "CREATININE", "CALCIUM", "MG", "PHOS" in the last 168 hours. GFR: CrCl cannot be calculated (Patient's most recent lab result is older than the maximum 21 days allowed.). Recent Labs  Lab 01/04/22 1940  WBC 21.9*    Liver Function Tests: No results for input(s): "AST", "ALT", "ALKPHOS", "BILITOT", "PROT", "ALBUMIN" in the last 168 hours. No results for input(s): "LIPASE", "AMYLASE" in the last 168 hours. No results for input(s): "AMMONIA" in the last 168 hours.  ABG    Component Value Date/Time   PHART 7.579 (H) 10/27/2021 0245   PCO2ART 35.2 10/27/2021 0245   PO2ART 80 (L) 10/27/2021 0245   HCO3 32.9 (H) 10/27/2021 0245   TCO2 34 (H) 10/27/2021 0245   ACIDBASEDEF 2.0 05/30/2021 0052   O2SAT 97 10/27/2021 0245     Coagulation Profile: No results for input(s): "INR", "PROTIME" in the last 168 hours.  Cardiac Enzymes: No results for input(s): "CKTOTAL", "CKMB", "CKMBINDEX", "TROPONINI" in the last 168 hours.  HbA1C: Hgb A1c MFr Bld  Date/Time Value Ref Range Status  04/11/2021 09:12 PM 5.5 4.8 - 5.6 % Final    Comment:     (NOTE) Pre diabetes:          5.7%-6.4%  Diabetes:              >6.4%  Glycemic control for   <7.0% adults with diabetes   10/02/2020 04:46 AM 6.1 (H) 4.8 - 5.6 % Final    Comment:    (NOTE) Pre diabetes:          5.7%-6.4%  Diabetes:              >6.4%  Glycemic control for   <7.0% adults with diabetes     CBG: No results for input(s): "GLUCAP" in the last 168 hours.  Review of Systems:   Patient is encephalopathic and/or intubated. Therefore history has been obtained from chart review.    Past Medical History:  He,  has a past medical history of CHF (congestive heart failure) ([place]), Coronary artery disease, Essential tremor, GI bleed (2019), Headache, Low blood sugar, Mitral regurgitation, Mitral valve prolapse, Osteopenia (2021), Paroxysmal atrial fibrillation ([place]), and Tremor.   Surgical History:   Past Surgical History:  Procedure Laterality Date   COLONOSCOPY WITH PROPOFOL N/A 02/19/2017   Procedure: COLONOSCOPY WITH PROPOFOL;  Surgeon: Wilford Corner, MD;  Location: [facility];  Service: Endoscopy;  Laterality: N/A;   IR GASTROSTOMY TUBE MOD SED  10/18/2020   IR IVC FILTER PLMT / S&I /IMG GUID/MOD SED  11/01/2020   laser eye surgery for retina detachment     MITRAL VALVE REPAIR  01/2003   monitor  02/05/2006   polyp removal     TONSILLECTOMY     tooth removal     as a teenager   TRACHEOSTOMY TUBE PLACEMENT N/A 10/26/2020   Procedure: TRACHEOSTOMY;  Surgeon: Rozetta Nunnery, MD;  Location: [place];  Service: ENT;  Laterality: N/A;     Social History:   reports that he has never smoked. He has never used smokeless tobacco. He reports current alcohol use. He reports that he does not use drugs.   Family History:  His family history includes Asthma in his daughter; CAD in his mother; Epilepsy in his son; Heart disease in his mother; Heart failure in his father, maternal grandmother, and paternal grandmother; Migraines in his mother; Pneumonia in his  maternal grandfather; Skin cancer in his father; Stroke in his paternal grandfather; Tremor in his father; Valvular heart disease in his father. There is no history of Parkinson's disease.   Allergies Allergies  Allergen Reactions   Prednisone Other (See Comments)    Listed as allergy on MAR Unknown reaction   Cortisone Other (See Comments)    Listed as allergy per MAR Unknown reaction     Home Medications  Prior to Admission medications   Medication Sig Start Date End Date Taking? Authorizing Provider  acetaminophen (TYLENOL) 325 MG tablet Take 650 mg by mouth every 8 (eight) hours as needed for mild pain or fever.    [provider]  ALPRAZolam Duanne Moron) 0.5 MG tablet Place 0.5 mg into feeding tube every 8 (eight) hours as needed for anxiety.    [provider]  esomeprazole (NEXIUM) 40 MG capsule Take 40 mg by mouth daily. Via feeding tube    [provider]  folic acid (FOLVITE) 1 MG tablet Take 1 tablet (1 mg total) by mouth daily. Patient taking differently: Place 1 mg into feeding tube daily. 09/29/20   [name], MD  furosemide (LASIX) 20 MG tablet Place 1 tablet (20 mg total) into feeding tube daily as needed for fluid or edema. Patient taking differently: Place 20 mg into feeding tube daily. 07/03/21   Mercy Riding, MD  hydrOXYzine (ATARAX) 25 MG tablet Place 25 mg into feeding tube every 6 (six) hours as needed for anxiety.    [provider]  Lactobacillus-Inulin ([employer]) CAPS Give 1 capsule by tube daily. 10 billion    [provider]  lactulose (CHRONULAC) 10 GM/15ML solution Place 20 g into feeding tube every 12 (twelve) hours as needed (constipation).    [provider]  levalbuterol Penne Lash) 1.25 MG/3ML nebulizer solution Take 1.25 mg by nebulization every 6 (six) hours.    [provider]  levETIRAcetam (KEPPRA) 100 MG/ML solution Place 1,500 mg into feeding tube in the morning  and at bedtime.    [provider]  levothyroxine (SYNTHROID) 25 MCG tablet Place 25 mcg into feeding tube daily.    [provider]  LORazepam (ATIVAN) 2 MG/ML injection 2 mg as needed (seizures).    [provider]  midodrine (PROAMATINE) 10 MG tablet Place 1 tablet (10 mg total) into feeding tube 3 (three) times daily with meals. Patient taking differently: Place 10 mg into feeding tube 3 (three) times daily. 05/01/21  Thurnell Lose, MD  Multiple Vitamin (MULTIVITAMIN WITH MINERALS) TABS tablet Place 1 tablet into feeding tube daily. 05/11/21   Raiford Noble Latif, DO  Nutritional Supplements (FEEDING SUPPLEMENT, JEVITY 1.5 CAL/FIBER,) LIQD Place 1,000 mLs into feeding tube continuous. Patient not taking: Reported on 08/30/2021 05/10/21   Raiford Noble Latif, DO  Nutritional Supplements (FEEDING SUPPLEMENT, OSMOLITE 1.5 CAL,) LIQD Place 55 mL/hr into feeding tube See admin instructions. 55 ml/hr for 22-24 hours = 1815-1980 kcal & 1520-1603 ml    [provider]  Omega-3 Fatty Acids (FISH OIL) 1000 MG CAPS Take 1,000 mg by mouth daily.    [provider]  polyethylene glycol (MIRALAX / GLYCOLAX) 17 g packet Place 17 g into feeding tube every other day.    [provider]  Probiotic Product (CULTURELLE PROBIOTICS PO) Give 1 capsule by tube daily.    [provider]  propranolol (INDERAL) 10 MG tablet Place 1 tablet (10 mg total) into feeding tube daily as needed (for tremors in AM. do not administer if heart rate is below 60 or systolic blood pressure is below 90.). Patient taking differently: Place 10 mg into feeding tube See admin instructions. 10 mg once daly as needed for tremors, HOLD if heart rate is below 60 or systolic blood pressure is below 90. 05/01/21   Singh, Margaree Mackintosh, MD  sertraline (ZOLOFT) 25 MG tablet Place 25 mg into feeding tube daily.    [provider]  Soap & Cleansers (CETAPHIL GENTLE CLEANSER EX) Apply  10 mLs topically daily.    [provider]  tamsulosin (FLOMAX) 0.4 MG CAPS capsule Take 0.4 mg by mouth daily. Via feeding tube    [provider]  Water For Irrigation, Sterile (STERILE WATER FOR IRRIGATION) Irrigate with 100 mLs as directed See admin instructions. 100 ml H2O flush every 4 hours    [provider]     Critical care time: 45 minutes    JD Rexene Agent [facility] 01/04/2022, 7:58 PM  Please see [email or web address] for pager details.  From 7A-7P if no response, please call [phone]. After hours, please call ELink [phone].

## 2022-01-05 DIAGNOSIS — J9601 Acute respiratory failure with hypoxia: Secondary | ICD-10-CM | POA: Diagnosis present

## 2022-01-05 DIAGNOSIS — J9621 Acute and chronic respiratory failure with hypoxia: Secondary | ICD-10-CM | POA: Diagnosis not present

## 2022-01-05 DIAGNOSIS — R569 Unspecified convulsions: Secondary | ICD-10-CM | POA: Diagnosis not present

## 2022-01-05 DIAGNOSIS — J9622 Acute and chronic respiratory failure with hypercapnia: Secondary | ICD-10-CM | POA: Diagnosis not present

## 2022-01-05 LAB — LACTIC ACID, PLASMA
Lactic Acid, Venous: 3.5 mmol/L (ref 0.5–1.9)
Lactic Acid, Venous: 3.7 mmol/L (ref 0.5–1.9)

## 2022-01-05 LAB — POCT I-STAT 7, (LYTES, BLD GAS, ICA,H+H)
Acid-Base Excess: 6 mmol/L — ABNORMAL HIGH (ref 0.0–2.0)
Bicarbonate: 28.8 mmol/L — ABNORMAL HIGH (ref 20.0–28.0)
Calcium, Ion: 1.22 mmol/L (ref 1.15–1.40)
HCT: 30 % — ABNORMAL LOW (ref 39.0–52.0)
Hemoglobin: 10.2 g/dL — ABNORMAL LOW (ref 13.0–17.0)
O2 Saturation: 99 %
Patient temperature: 97.7
Potassium: 3.8 mmol/L (ref 3.5–5.1)
Sodium: 138 mmol/L (ref 135–145)
TCO2: 30 mmol/L (ref 22–32)
pCO2 arterial: 31.7 mmHg — ABNORMAL LOW (ref 32–48)
pH, Arterial: 7.564 — ABNORMAL HIGH (ref 7.35–7.45)
pO2, Arterial: 122 mmHg — ABNORMAL HIGH (ref 83–108)

## 2022-01-05 LAB — BASIC METABOLIC PANEL
Anion gap: 11 (ref 5–15)
BUN: 26 mg/dL — ABNORMAL HIGH (ref 8–23)
CO2: 28 mmol/L (ref 22–32)
Calcium: 9.3 mg/dL (ref 8.9–10.3)
Chloride: 101 mmol/L (ref 98–111)
Creatinine, Ser: 0.49 mg/dL — ABNORMAL LOW (ref 0.61–1.24)
GFR, Estimated: >60 mL/min (ref >60)
Glucose, Bld: 81 mg/dL (ref 70–99)
Potassium: 4.5 mmol/L (ref 3.5–5.1)
Sodium: 140 mmol/L (ref 135–145)

## 2022-01-05 LAB — RESPIRATORY PANEL BY PCR

## 2022-01-05 LAB — PHOSPHORUS
Phosphorus: 3 mg/dL (ref 2.5–4.6)
Phosphorus: 3.3 mg/dL (ref 2.5–4.6)

## 2022-01-05 LAB — STREP PNEUMONIAE URINARY ANTIGEN: Strep Pneumo Urinary Antigen: NEGATIVE

## 2022-01-05 LAB — URINALYSIS, COMPLETE (UACMP) WITH MICROSCOPIC
Bilirubin Urine: NEGATIVE
Glucose, UA: >=500 mg/dL — AB
Hgb urine dipstick: NEGATIVE
Ketones, ur: NEGATIVE mg/dL
Nitrite: NEGATIVE
Protein, ur: 100 mg/dL — AB
Specific Gravity, Urine: 1.014 (ref 1.005–1.030)
WBC, UA: >50 WBC/hpf — ABNORMAL HIGH (ref 0–5)
pH: 5 (ref 5.0–8.0)

## 2022-01-05 LAB — CBC
HCT: 35.7 % — ABNORMAL LOW (ref 39.0–52.0)
Hemoglobin: 10.9 g/dL — ABNORMAL LOW (ref 13.0–17.0)
MCH: 26.7 pg (ref 26.0–34.0)
MCHC: 30.5 g/dL (ref 30.0–36.0)
MCV: 87.3 fL (ref 80.0–100.0)
Platelets: 295 10*3/uL (ref 150–400)
RBC: 4.09 MIL/uL — ABNORMAL LOW (ref 4.22–5.81)
RDW: 18.8 % — ABNORMAL HIGH (ref 11.5–15.5)
WBC: 15.4 10*3/uL — ABNORMAL HIGH (ref 4.0–10.5)
nRBC: 0 % (ref 0.0–0.2)

## 2022-01-05 LAB — GLUCOSE, CAPILLARY
Glucose-Capillary: 153 mg/dL — ABNORMAL HIGH (ref 70–99)
Glucose-Capillary: 51 mg/dL — ABNORMAL LOW (ref 70–99)
Glucose-Capillary: 74 mg/dL (ref 70–99)
Glucose-Capillary: 93 mg/dL (ref 70–99)
Glucose-Capillary: 109 mg/dL — ABNORMAL HIGH (ref 70–99)
Glucose-Capillary: 109 mg/dL — ABNORMAL HIGH (ref 70–99)

## 2022-01-05 LAB — MAGNESIUM
Magnesium: 2.2 mg/dL (ref 1.7–2.4)
Magnesium: 2 mg/dL (ref 1.7–2.4)

## 2022-01-05 LAB — AMMONIA: Ammonia: 68 umol/L — ABNORMAL HIGH (ref 9–35)

## 2022-01-05 LAB — RESP PANEL BY RT-PCR (RSV, FLU A&B, COVID)  RVPGX2
Influenza A by PCR: NEGATIVE
Influenza B by PCR: NEGATIVE
Resp Syncytial Virus by PCR: NEGATIVE
SARS Coronavirus 2 by RT PCR: NEGATIVE

## 2022-01-05 LAB — TSH: TSH: 1.783 u[IU]/mL (ref 0.350–4.500)

## 2022-01-05 MED ORDER — VITAL HIGH PROTEIN PO LIQD: 1000.0000 mL | ORAL | Status: DC

## 2022-01-05 MED ORDER — PROSOURCE TF20 ENFIT COMPATIBL EN LIQD
60.0000 mL | Freq: Two times a day (BID) | ENTERAL | Status: DC
  Administered 2022-01-05 – 2022-01-06 (×3): 60 mL
  Filled 2022-01-05 (×3): qty 60

## 2022-01-05 MED ORDER — FREE WATER
100.0000 mL | Status: DC
  Administered 2022-01-05 – 2022-01-06 (×8): 100 mL

## 2022-01-05 MED ORDER — OSMOLITE 1.5 CAL PO LIQD
1000.0000 mL | ORAL | Status: DC
  Administered 2022-01-05 – 2022-01-06 (×3): 1000 mL
  Filled 2022-01-05: qty 1000

## 2022-01-05 MED ORDER — SODIUM CHLORIDE 0.9 % IV BOLUS
500.0000 mL | Freq: Once | INTRAVENOUS | Status: AC
  Administered 2022-01-05: 500 mL via INTRAVENOUS

## 2022-01-05 MED ORDER — ADULT MULTIVITAMIN W/MINERALS CH
1.0000 | ORAL_TABLET | Freq: Every day | ORAL | Status: DC
  Administered 2022-01-05 – 2022-01-06 (×2): 1
  Filled 2022-01-05 (×2): qty 1

## 2022-01-05 MED ORDER — DEXTROSE 50 % IV SOLN
25.0000 g | INTRAVENOUS | Status: AC
  Administered 2022-01-05: 25 g via INTRAVENOUS
  Filled 2022-01-05: qty 50

## 2022-01-05 NOTE — Progress Notes (Signed)
NAME:  Austin Davis, MRN:  [id number], DOB:  Feb 06, 1940, LOS: 1 ADMISSION DATE:  01/04/2022, CONSULTATION DATE:  12/16 REFERRING MD:  Dr. Zenia Resides, CHIEF COMPLAINT:  Seizure?   History of Present Illness:  Patient is a 81 year old male with pertinent PMH of chronic respiratory failure vent dependent, tracheostomy in place, seizure, parkinsons, tremor, A-fib not on [facility] presents to [facility] ED on 12/16 for possible seizure.  Patient resides at [place] at 6 PM on 12/16.  Patient was found unresponsive.  Unknown baseline.  Last documented on last admission was able to follow commands on 10/2021.  Patient transferred to [facility] for further eval.  Upon arrival to [facility] ED on 12/16, patient remains unresponsive.  Patient hypoxic in 70s and having trouble ventilating patient with high peak airway pressures.  Patient had episode of vomiting.  CXR showing RML opacities.  Other vital stable.  ABG 7.11, 119, 276, 37.  WBC 21.  CT head on hold due to hypoxia episode.  Neuro consulted.  EEG ordered.  PCCM consulted for ICU admission.   Other pertinent labs: Hemoglobin 11.9, K5.7, glucose 330  Pertinent  Medical History   Past Medical History:  Diagnosis Date   CHF (congestive heart failure) (HCC)    Coronary artery disease    Essential tremor    GI bleed 2019   hospitalized at [facility] for one week   Headache    since childhood   Low blood sugar    since childhood, controlled by diet   Mitral regurgitation    Mitral valve prolapse    Osteopenia 2021   Paroxysmal atrial fibrillation ([place])    Tremor    Significant Hospital Events: Including procedures, antibiotic start and stop dates in addition to other pertinent events   12/16 admitted to [facility] for possible seizure activity; neuro consulted; Initially having difficulty oxygenating patient; PCCM consulted. Trach repositioned, sats improved / vent mechanics improved.   Interim History / Subjective:  Afebrile  Pt reports feeling  "pretty good" I/O 300 ml UOP, +856ml in last 24 hours   Objective   Blood pressure 124/69, pulse 71, temperature 98.4 F (36.9 C), temperature source Oral, resp. rate (!) 24, height 5\' 10"  (1.778 m), weight 50.4 kg, SpO2 96 %.    Vent Mode: PRVC FiO2 (%):  [40 %-50 %] 40 % Set Rate:  [20 bmp-28 bmp] 20 bmp Vt Set:  [580 mL] 580 mL PEEP:  [5 cmH20] 5 cmH20 Plateau Pressure:  [14 cmH20-28 cmH20] 23 cmH20   Intake/Output Summary (Last 24 hours) at 01/05/2022 1331 Last data filed at 01/05/2022 0600 Gross per 24 hour  Intake 1159.58 ml  Output 300 ml  Net 859.58 ml   Filed Weights   01/04/22 2200 01/04/22 2229 01/05/22 0500  Weight: 50.5 kg 50.5 kg 50.4 kg    Examination: General: chronically ill appearing elderly male lying in bed on vent in NAD  HEENT: MM pink/moist, trach midline, pt able to cough secretions up around trach, clear mucus, anicteric, pupils =/reactive  Neuro: Awake, alert, appropriate in communication attempts, generalized weakness but non-focal  CV: s1s2 RRR, no m/r/g PULM: non-labored at rest, lungs bilaterally with coarse rhonchi  GI: soft, bsx4 active, PEG  Extremities: warm/dry, no edema, muscle wasting Skin: no rashes or lesions  Resolved Hospital Problem list     Assessment & Plan:   Acute on Chronic Hypercarbic Respiratory Failure Ventilator Dependent / Chronic Tracheostomy   RML Consolidation Possible aspiration given vomiting episode prior  to arrival; hx of recent MDR Klebsiella pneumonia. Initially with severe acute respiratory acidosis.  Suspect this was largely due to trach dislodgement.  Urine strep negative.  -PRVC with LTVV  -wean PEEP / FiO2 for sats >90% -trach adjusted seems to have corrected ventilator / oxygen issues  -ABG reviewed, reduce rate to 16 -VAP prevention measures  -continue meropenem. Discontinue azithromycin, vancomycin   -follow up tracheal aspirate, legionella   Sepsis Secondary to above  -follow cultures   -discontinue vancomycin -follow PCT (largely negative), WBC / fever curve    Possible Generalized Tonic Clonic Seizure Acute Encephalopathy: likely hypercarbia/hypoxia related Hx of seizure disorder Parkinson disease, tremors Baseline - alert, able to communicate with staff, appropriate. CT head negative  -follow neuro exam  -Neurology following, appreciate in put  -appears back to baseline  -limit sedating medications  -AED's per Neuro  -hold propanolol for now   Hyperkalemia -follow trend   Hyperglycemia Hgb A1c 5.5 03/2021 -SSI, sensitive scale  Anemia -follow CBC, transfuse for Hgb <7% or active bleeding   Possible new LBBB: lots of artifact b/c of baseline tremor -tele monitoring   Hx of paroxysmal afib Not on [facility] -tele monitoring  -rate controlled   Generalized failure to thrive, chronic malnutrition -TF per Nutrition  -PEG tube care per protocol   Hypothyroidism TSH 1.783  -continue synthroid    Chronic Hypotension -midodrine    Chronic Anxiety -hold home xanax  -continue sertraline   Best Practice (right click and "Reselect all SmartList Selections" daily)  Diet/type: tubefeeds and NPO DVT prophylaxis: SCD  GI prophylaxis: H2B Lines: N/A Foley:  N/A Code Status:  full code Last date of multidisciplinary goals of care discussion: pending  Dispo: may be able to go back to [facility] 12/18  Critical care time: 29 minutes    Noe Gens, MSN, APRN, NP-C, AGACNP-BC [place] [facility] 01/05/2022, 1:31 PM   Please see [email or web address] for pager details.   From 7A-7P if no response, please call [phone] After hours, please call ELink [phone]

## 2022-01-05 NOTE — H&P (Deleted)
NAME:  Austin Davis, MRN:  [id number], DOB:  Feb 11, 1940, LOS: 1 ADMISSION DATE:  01/04/2022, CONSULTATION DATE:  12/16 REFERRING MD:  Dr. Zenia Resides, CHIEF COMPLAINT:  Seizure?   History of Present Illness:  Patient is a 81 year old male with pertinent PMH of chronic respiratory failure vent dependent, tracheostomy in place, seizure, parkinsons, tremor, A-fib not on [facility] presents to [facility] ED on 12/16 for possible seizure.  Patient resides at [place] at 6 PM on 12/16.  Patient was found unresponsive.  Unknown baseline.  Last documented on last admission was able to follow commands on 10/2021.  Patient transferred to [facility] for further eval.  Upon arrival to [facility] ED on 12/16, patient remains unresponsive.  Patient hypoxic in 70s and having trouble ventilating patient with high peak airway pressures.  Patient had episode of vomiting.  CXR showing RML opacities.  Other vital stable.  ABG 7.11, 119, 276, 37.  WBC 21.  CT head on hold due to hypoxia episode.  Neuro consulted.  EEG ordered.  PCCM consulted for ICU admission.   Other pertinent labs: Hemoglobin 11.9, K5.7, glucose 330  Pertinent  Medical History   Past Medical History:  Diagnosis Date   CHF (congestive heart failure) (HCC)    Coronary artery disease    Essential tremor    GI bleed 2019   hospitalized at [facility] for one week   Headache    since childhood   Low blood sugar    since childhood, controlled by diet   Mitral regurgitation    Mitral valve prolapse    Osteopenia 2021   Paroxysmal atrial fibrillation ([place])    Tremor    Significant Hospital Events: Including procedures, antibiotic start and stop dates in addition to other pertinent events   12/16 admitted to [facility] for possible seizure activity; neuro consulted; Initially having difficulty oxygenating patient; PCCM consulted. Trach repositioned, sats improved / vent mechanics improved.   Interim History / Subjective:  Afebrile  Pt reports feeling  "pretty good" I/O 300 ml UOP, +894ml in last 24 hours   Objective   Blood pressure 124/69, pulse 71, temperature 98.4 F (36.9 C), temperature source Oral, resp. rate (!) 24, height 5\' 10"  (1.778 m), weight 50.4 kg, SpO2 96 %.    Vent Mode: PRVC FiO2 (%):  [40 %-50 %] 40 % Set Rate:  [20 bmp-28 bmp] 20 bmp Vt Set:  [580 mL] 580 mL PEEP:  [5 cmH20] 5 cmH20 Plateau Pressure:  [14 cmH20-28 cmH20] 23 cmH20   Intake/Output Summary (Last 24 hours) at 01/05/2022 1259 Last data filed at 01/05/2022 0600 Gross per 24 hour  Intake 1159.58 ml  Output 300 ml  Net 859.58 ml   Filed Weights   01/04/22 2200 01/04/22 2229 01/05/22 0500  Weight: 50.5 kg 50.5 kg 50.4 kg    Examination: General: chronically ill appearing elderly male lying in bed on vent in NAD  HEENT: MM pink/moist, trach midline, pt able to cough secretions up around trach, clear mucus, anicteric, pupils =/reactive  Neuro: Awake, alert, appropriate in communication attempts, generalized weakness but non-focal  CV: s1s2 RRR, no m/r/g PULM: non-labored at rest, lungs bilaterally with coarse rhonchi  GI: soft, bsx4 active, PEG  Extremities: warm/dry, no edema, muscle wasting Skin: no rashes or lesions  Resolved Hospital Problem list     Assessment & Plan:   Acute on Chronic Hypercarbic Respiratory Failure Ventilator Dependent / Chronic Tracheostomy   RML Consolidation Possible aspiration given vomiting episode prior  to arrival; hx of recent MDR Klebsiella pneumonia. Initially with severe acute respiratory acidosis.  Suspect this was largely due to trach dislodgement.  Urine strep negative.  -PRVC with LTVV  -wean PEEP / FiO2 for sats >90% -trach adjusted seems to have corrected ventilator / oxygen issues  -ABG reviewed, reduce rate to 16 -VAP prevention measures  -continue meropenem. Discontinue azithromycin, vancomycin   -follow up tracheal aspirate, legionella   Sepsis Secondary to above  -follow cultures   -discontinue vancomycin -follow PCT (largely negative), WBC / fever curve    Possible Generalized Tonic Clonic Seizure Acute Encephalopathy: likely hypercarbia/hypoxia related Hx of seizure disorder Parkinson disease, tremors Baseline - alert, able to communicate with staff, appropriate. CT head negative  -follow neuro exam  -Neurology following, appreciate in put  -appears back to baseline  -limit sedating medications  -AED's per Neuro  -hold propanolol for now   Hyperkalemia -follow trend   Hyperglycemia Hgb A1c 5.5 03/2021 -SSI, sensitive scale  Anemia -follow CBC, transfuse for Hgb <7% or active bleeding   Possible new LBBB: lots of artifact b/c of baseline tremor -tele monitoring   Hx of paroxysmal afib Not on [facility] -tele monitoring  -rate controlled   Generalized failure to thrive, chronic malnutrition -TF per Nutrition  -PEG tube care per protocol   Hypothyroidism TSH 1.783  -continue synthroid    Chronic Hypotension -midodrine    Chronic Anxiety -hold home xanax  -continue sertraline   Best Practice (right click and "Reselect all SmartList Selections" daily)  Diet/type: tubefeeds and NPO DVT prophylaxis: SCD  GI prophylaxis: H2B Lines: N/A Foley:  N/A Code Status:  full code Last date of multidisciplinary goals of care discussion: pending  Critical care time: 32 minutes    Noe Gens, MSN, APRN, NP-C, AGACNP-BC [place] [facility] 01/05/2022, 12:59 PM   Please see [email or web address] for pager details.   From 7A-7P if no response, please call [phone] After hours, please call ELink [phone]

## 2022-01-05 NOTE — Progress Notes (Signed)
Assessed PEG site at bedside. PEG intact, gastric secretions leaking from stoma.  Tissue around stoma with moisture associated maceration at site but no open areas.     Plan: -will ask IR to evaluate for PEG tube exchange  -RN instructed to keep site clean and dry -ok for ongoing TF as tolerated   Noe Gens, MSN, APRN, NP-C, AGACNP-BC [place] [facility] 01/05/2022, 5:12 PM   Please see [email or web address] for pager details.   From 7A-7P if no response, please call [phone] After hours, please call ELink [phone]

## 2022-01-05 NOTE — Progress Notes (Signed)
Initial Nutrition Assessment RD working remotely.   DOCUMENTATION CODES:   Underweight  INTERVENTION:  - ordered Osmolite 1.5 @ 20 ml/hr to advance by 10 ml every 12 hours to reach goal rate of 40 ml/hr with 60 ml Prosource TF20 BID and 100 ml water every 4 hours.  - at goal rate, this regimen will provide 1600 kcal, 100 grams protein, and 1331 ml water.  - ordered 1 tablet multivitamin with minerals/day via tube.  - complete NFPE when feasible.   - Monitor magnesium, potassium, and phosphorus BID for at least 3 days, MD to replete as needed, as pt is at risk for refeeding syndrome given weight loss in the past 2 months, unsure of when patient last had goal TF regimen.   NUTRITION DIAGNOSIS:   Inadequate oral intake related to inability to eat as evidenced by NPO status.  GOAL:   Patient will meet greater than or equal to 90% of their needs  MONITOR:   Vent status, TF tolerance, Labs, Weight trends  REASON FOR ASSESSMENT:   Ventilator, Consult Enteral/tube feeding initiation and management  ASSESSMENT:   MDR klebsiella UTIs. He presented to the ED due to seizure activity. Initially, Code Stroke was activated but was discontinued by Neurology. He had low O2 saturations in the ED and there was difficulty improving O2 sat despite bagging and suctioning; improvement after suctioning by RT. 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 ABG showed significant hypercapnea.  Patient has been NPO since admission. He is currently on vent via trach and he has G-tube/PEG from PTA (placed 10/18/20). Tube feeding not yet initiated.  Patient was last assessed by [facility] during hospitalization in beginning to mid-October. At that time, he was receiving Osmolite 1.5 @ 55 ml/hr with 60 ml Prosource TF20 once/day via G-tube/PEG. This regimen provides 2060 kcal, 103 grams protein, and 1006 ml water.   Weight today is 111 lb and  weight on 10/22/21 was 121 lb. This indicates 10 lb (8.3%) weight loss in the past 2 months; significant for time frame. Generalized non-pitting edema documented in the edema section of flow sheet.  Per notes: - acute on chronic hypercarbic respiratory failure - possible aspiration during vomiting episode PTA - severe respiratory acidosis  - sepsis - possible generalized tonic clonic seizure - acute encephalopathy - possible LBBB - failure to thrive, chronic malnutrition   Patient is currently on ventilator support via trach MV: 11.7 L/min Temp (24hrs), Avg:98.3 F (36.8 C), Min:97.7 F (36.5 C), Max:99.2 F (37.3 C) Propofol: none  Labs reviewed; CBGs: 51, 153, 74 mg/dl, BUN: 26 mg/dl, creatinine: 0.49 mg/dl  Medications reviewed; 20 mg pepcid BID per tube, 1 mg folvite/day per tube, sliding scale novolog, 25 mcg synthroid/day per tube, 10 g lokelma x1 dose 12/16.    NUTRITION - FOCUSED PHYSICAL EXAM:  RD working remotely.  Diet Order:   Diet Order             Diet NPO time specified  Diet effective now                   EDUCATION NEEDS:   Not appropriate for education at this time  Skin:  Skin Assessment: Skin Integrity Issues: Skin Integrity Issues:: DTI DTI: L buttocks  Last BM:  PTA/unknown  Height:   Ht Readings from Last 1 Encounters:  01/04/22 5\' 10"  (1.778 m)    Weight:   Wt Readings from Last 1 Encounters:  01/05/22 50.4 kg     BMI:  Body mass index is 15.94 kg/m.  Estimated Nutritional Needs:  Kcal:  1557 kcal Protein:  76-101 grams (1.5-2 grams/kg) Fluid:  >/= 2 L/day     Jarome Matin, MS, RD, LDN, CNSC Clinical Dietitian PRN/Relief staff On-call/weekend pager # available in [facility]

## 2022-01-05 NOTE — Progress Notes (Signed)
[facility] Physician-Brief Progress Note Patient Name: Austin Davis DOB: April 06, 1940 MRN: [id number]   Date of Service  01/05/2022  HPI/Events of Note  Lactic 3.5 from 3.8 with no fluids going, 130/93   HR 74  last echo 2022 with EF 45-50 %  BSRN asks if pt needed fluids, trach/vent with g-tube and PIV  eICU Interventions  Reasonable to give some fluids as lactic acidosis should have resolved by now if this was just from the seizure Ordered 500 cc NS bolus     Intervention Category Intermediate Interventions: Diagnostic test evaluation  Judd Lien 01/05/2022, 12:19 AM

## 2022-01-06 ENCOUNTER — Inpatient Hospital Stay (HOSPITAL_COMMUNITY): Payer: Medicare Other

## 2022-01-06 DIAGNOSIS — J9621 Acute and chronic respiratory failure with hypoxia: Secondary | ICD-10-CM | POA: Diagnosis not present

## 2022-01-06 DIAGNOSIS — J9622 Acute and chronic respiratory failure with hypercapnia: Secondary | ICD-10-CM | POA: Diagnosis not present

## 2022-01-06 DIAGNOSIS — R569 Unspecified convulsions: Secondary | ICD-10-CM | POA: Diagnosis not present

## 2022-01-06 LAB — GLUCOSE, CAPILLARY
Glucose-Capillary: 111 mg/dL — ABNORMAL HIGH (ref 70–99)
Glucose-Capillary: 119 mg/dL — ABNORMAL HIGH (ref 70–99)
Glucose-Capillary: 127 mg/dL — ABNORMAL HIGH (ref 70–99)
Glucose-Capillary: 129 mg/dL — ABNORMAL HIGH (ref 70–99)

## 2022-01-06 LAB — CBC
HCT: 28.9 % — ABNORMAL LOW (ref 39.0–52.0)
Hemoglobin: 9.3 g/dL — ABNORMAL LOW (ref 13.0–17.0)
MCH: 27.3 pg (ref 26.0–34.0)
MCHC: 32.2 g/dL (ref 30.0–36.0)
MCV: 84.8 fL (ref 80.0–100.0)
Platelets: 328 10*3/uL (ref 150–400)
RBC: 3.41 MIL/uL — ABNORMAL LOW (ref 4.22–5.81)
RDW: 20 % — ABNORMAL HIGH (ref 11.5–15.5)
WBC: 10 10*3/uL (ref 4.0–10.5)
nRBC: 0 % (ref 0.0–0.2)

## 2022-01-06 LAB — MAGNESIUM: Magnesium: 1.9 mg/dL (ref 1.7–2.4)

## 2022-01-06 LAB — BASIC METABOLIC PANEL
Anion gap: 7 (ref 5–15)
BUN: 31 mg/dL — ABNORMAL HIGH (ref 8–23)
CO2: 31 mmol/L (ref 22–32)
Calcium: 8.6 mg/dL — ABNORMAL LOW (ref 8.9–10.3)
Chloride: 105 mmol/L (ref 98–111)
Creatinine, Ser: 0.45 mg/dL — ABNORMAL LOW (ref 0.61–1.24)
GFR, Estimated: >60 mL/min (ref >60)
Glucose, Bld: 118 mg/dL — ABNORMAL HIGH (ref 70–99)
Potassium: 3 mmol/L — ABNORMAL LOW (ref 3.5–5.1)
Sodium: 143 mmol/L (ref 135–145)

## 2022-01-06 LAB — HEMOGLOBIN A1C
Hgb A1c MFr Bld: 5.3 % (ref 4.8–5.6)
Mean Plasma Glucose: 105 mg/dL

## 2022-01-06 LAB — PHOSPHORUS: Phosphorus: 2.3 mg/dL — ABNORMAL LOW (ref 2.5–4.6)

## 2022-01-06 MED ORDER — ENOXAPARIN SODIUM 40 MG/0.4ML IJ SOSY: 40.0000 mg | PREFILLED_SYRINGE | INTRAMUSCULAR | Status: DC

## 2022-01-06 MED ORDER — DIATRIZOATE MEGLUMINE & SODIUM 66-10 % PO SOLN
20.0000 mL | Freq: Once | ORAL | Status: AC
  Administered 2022-01-06: 20 mL

## 2022-01-06 MED ORDER — POTASSIUM PHOSPHATES 15 MMOLE/5ML IV SOLN
30.0000 mmol | Freq: Once | INTRAVENOUS | Status: AC
  Administered 2022-01-06: 30 mmol via INTRAVENOUS
  Filled 2022-01-06: qty 10

## 2022-01-06 MED ORDER — DIATRIZOATE MEGLUMINE & SODIUM 66-10 % PO SOLN
ORAL | Status: AC
  Administered 2022-01-06: 20 mL via GASTROSTOMY
  Filled 2022-01-06: qty 30

## 2022-01-06 MED ORDER — HYDROXYZINE HCL 10 MG/5ML PO SYRP
10.0000 mg | ORAL_SOLUTION | Freq: Once | ORAL | Status: AC | PRN
  Administered 2022-01-06: 10 mg via ORAL
  Filled 2022-01-06: qty 5

## 2022-01-06 MED ORDER — POTASSIUM CHLORIDE 20 MEQ PO PACK
40.0000 meq | PACK | Freq: Once | ORAL | Status: AC
  Administered 2022-01-06: 40 meq
  Filled 2022-01-06: qty 2

## 2022-01-06 MED ORDER — POLYETHYLENE GLYCOL 3350 17 G PO PACK
17.0000 g | PACK | Freq: Every day | ORAL | Status: DC
  Administered 2022-01-06: 17 g
  Filled 2022-01-06: qty 1

## 2022-01-06 MED ORDER — CHLORHEXIDINE GLUCONATE CLOTH 2 % EX PADS
6.0000 | MEDICATED_PAD | Freq: Every day | CUTANEOUS | Status: DC
  Administered 2022-01-06: 6 via TOPICAL

## 2022-01-06 MED ORDER — DIATRIZOATE MEGLUMINE & SODIUM 66-10 % PO SOLN
ORAL | Status: AC
  Administered 2022-01-06: 20 mL
  Filled 2022-01-06: qty 30

## 2022-01-06 MED ORDER — DIATRIZOATE MEGLUMINE & SODIUM 66-10 % PO SOLN
20.0000 mL | Freq: Once | ORAL | Status: DC
  Filled 2022-01-06: qty 30

## 2022-01-06 MED ORDER — ALPRAZOLAM 0.25 MG PO TABS: 0.2500 mg | ORAL_TABLET | Freq: Two times a day (BID) | ORAL | Status: DC | PRN

## 2022-01-06 MED ORDER — SODIUM CHLORIDE 0.9 % IV SOLN: 1.0000 g | Freq: Three times a day (TID) | INTRAVENOUS | Status: DC

## 2022-01-06 NOTE — Progress Notes (Signed)
Chief Complaint: Patient was seen in consultation today for leaking G-tube   Referring Physician(s): Noe Gens NP  Supervising Physician: Michaelle Birks  Patient Status: [facility] - In-pt  History of Present Illness: Austin Davis is a 81 y.o. male with multiple medical issues. Had a balloon retention perc G-tube placed 09/2020  for dysphagia/malnutrition by this IR team. Somewhere in the interim, tube has been exchanged for 24 Fr balloon retention. Currently admitted for chronic resp failure, trach/vent dependent. Skin around G-tube noted to be red and macerated with leaking from G-tube site. IR is asked to eval.  Past Medical History:  Diagnosis Date   CHF (congestive heart failure) (HCC)    Coronary artery disease    Essential tremor    GI bleed 2019   hospitalized at [facility] for one week   Headache    since childhood   Low blood sugar    since childhood, controlled by diet   Mitral regurgitation    Mitral valve prolapse    Osteopenia 2021   Paroxysmal atrial fibrillation (HCC)    Tremor     Past Surgical History:  Procedure Laterality Date   COLONOSCOPY WITH PROPOFOL N/A 02/19/2017   Procedure: COLONOSCOPY WITH PROPOFOL;  Surgeon: Wilford Corner, MD;  Location: [facility];  Service: Endoscopy;  Laterality: N/A;   IR GASTROSTOMY TUBE MOD SED  10/18/2020   IR IVC FILTER PLMT / S&I /IMG GUID/MOD SED  11/01/2020   laser eye surgery for retina detachment     MITRAL VALVE REPAIR  01/2003   monitor  02/05/2006   polyp removal     TONSILLECTOMY     tooth removal     as a teenager   TRACHEOSTOMY TUBE PLACEMENT N/A 10/26/2020   Procedure: TRACHEOSTOMY;  Surgeon: Rozetta Nunnery, MD;  Location: [facility];  Service: ENT;  Laterality: N/A;    Allergies: Prednisone and Cortisone  Medications: Prior to Admission medications   Medication Sig Start Date End Date Taking? Authorizing Provider  acetaminophen (TYLENOL) 325 MG tablet Take 650 mg by mouth every 8  (eight) hours as needed for mild pain or fever.    [provider]  ALPRAZolam Duanne Moron) 0.5 MG tablet Place 0.5 mg into feeding tube every 8 (eight) hours as needed for anxiety.    [provider]  esomeprazole (NEXIUM) 40 MG capsule Take 40 mg by mouth daily. Via feeding tube    [provider]  folic acid (FOLVITE) 1 MG tablet Take 1 tablet (1 mg total) by mouth daily. Patient taking differently: Place 1 mg into feeding tube daily. 09/29/20   [name], MD  furosemide (LASIX) 20 MG tablet Place 1 tablet (20 mg total) into feeding tube daily as needed for fluid or edema. Patient taking differently: Place 20 mg into feeding tube daily. 07/03/21   Mercy Riding, MD  hydrOXYzine (ATARAX) 25 MG tablet Place 25 mg into feeding tube every 6 (six) hours as needed for anxiety.    [provider]  Lactobacillus-Inulin ([place]) CAPS Give 1 capsule by tube daily. 10 billion    [provider]  lactulose (CHRONULAC) 10 GM/15ML solution Place 20 g into feeding tube every 12 (twelve) hours as needed (constipation).    [provider]  levalbuterol Penne Lash) 1.25 MG/3ML nebulizer solution Take 1.25 mg by nebulization every 6 (six) hours.    [provider]  levETIRAcetam (KEPPRA) 100 MG/ML solution Place 1,500 mg into feeding tube in the morning  and at bedtime.    [provider]  levothyroxine (SYNTHROID) 25 MCG tablet Place 25 mcg into feeding tube daily.    [provider]  LORazepam (ATIVAN) 2 MG/ML injection 2 mg as needed (seizures).    [provider]  midodrine (PROAMATINE) 10 MG tablet Place 1 tablet (10 mg total) into feeding tube 3 (three) times daily with meals. Patient taking differently: Place 10 mg into feeding tube 3 (three) times daily. 05/01/21   Thurnell Lose, MD  Multiple Vitamin (MULTIVITAMIN WITH MINERALS) TABS tablet Place 1 tablet into feeding tube daily. 05/11/21    Raiford Noble Latif, DO  Nutritional Supplements (FEEDING SUPPLEMENT, JEVITY 1.5 CAL/FIBER,) LIQD Place 1,000 mLs into feeding tube continuous. Patient not taking: Reported on 08/30/2021 05/10/21   Raiford Noble Latif, DO  Nutritional Supplements (FEEDING SUPPLEMENT, OSMOLITE 1.5 CAL,) LIQD Place 55 mL/hr into feeding tube See admin instructions. 55 ml/hr for 22-24 hours = 1815-1980 kcal & 1520-1603 ml    [provider]  Omega-3 Fatty Acids (FISH OIL) 1000 MG CAPS Take 1,000 mg by mouth daily.    [provider]  polyethylene glycol (MIRALAX / GLYCOLAX) 17 g packet Place 17 g into feeding tube every other day.    [provider]  Probiotic Product (CULTURELLE PROBIOTICS PO) Give 1 capsule by tube daily.    [provider]  propranolol (INDERAL) 10 MG tablet Place 1 tablet (10 mg total) into feeding tube daily as needed (for tremors in AM. do not administer if heart rate is below 60 or systolic blood pressure is below 90.). Patient taking differently: Place 10 mg into feeding tube See admin instructions. 10 mg once daly as needed for tremors, HOLD if heart rate is below 60 or systolic blood pressure is below 90. 05/01/21   Singh, Margaree Mackintosh, MD  sertraline (ZOLOFT) 25 MG tablet Place 25 mg into feeding tube daily.    [provider]  Soap & Cleansers (CETAPHIL GENTLE CLEANSER EX) Apply 10 mLs topically daily.    [provider]  tamsulosin (FLOMAX) 0.4 MG CAPS capsule Take 0.4 mg by mouth daily. Via feeding tube    [provider]  Water For Irrigation, Sterile (STERILE WATER FOR IRRIGATION) Irrigate with 100 mLs as directed See admin instructions. 100 ml H2O flush every 4 hours    [provider]     Family History  Problem Relation Age of Onset   Heart disease Mother    CAD Mother    Migraines Mother    Heart failure Father    Skin cancer Father    Valvular heart disease Father        prolapsed valve   Tremor Father         "probably had a bit of tremor in his old age"   Heart failure Maternal Grandmother    Pneumonia Maternal Grandfather    Heart failure Paternal Grandmother    Stroke Paternal Grandfather    Asthma Daughter    Epilepsy Son    Parkinson's disease Neg Hx     Social History   Socioeconomic History   Marital status: Single    Spouse name: Not on file   Number of children: 2   Years of education: 16   Highest education level: Not on file  Occupational History   Occupation: [employer] home [employer]  Tobacco Use   Smoking status: Never   Smokeless tobacco: Never   Tobacco comments:    only tried a  few cigarettes when he was younger  Substance and Sexual Activity   Alcohol use: Yes    Comment: "probably a 6 pack of beer a year"   Drug use: No   Sexual activity: Not on file  Other Topics Concern   Not on file  Social History Narrative   Lives with girlfriend   Caffeine use: rarely    Right handed   Social Determinants of Health   Financial Resource Strain: Not on file  Food Insecurity: Not on file  Transportation Needs: Not on file  Physical Activity: Not on file  Stress: Not on file  Social Connections: Not on file    Review of Systems: A 12 point ROS discussed and pertinent positives are indicated in the HPI above.  All other systems are negative.  Review of Systems  Vital Signs: BP (!) 158/91   Pulse 84   Temp 97.7 F (36.5 C) (Axillary)   Resp 19   Ht 5\' 10"  (1.778 m)   Wt 114 lb 3.2 oz (51.8 kg)   SpO2 97%   BMI 16.39 kg/m   Physical Exam Abdominal:     Comments: G-tube located intact in LUQ. Unfortunately tube lies within skin fold. Skin surrounding tube insertion site is erythematous and raw. There is evidence of some leakage of TF and gastric material/bile.     Imaging: EEG adult  Result Date: 01/06/2022 Lora Havens, MD     01/06/2022 11:07 AM Patient Name: GERRAD WELKER MRN: [id number] Epilepsy Attending: Lora Havens Referring  Physician/Provider: Kerney Elbe, MD Date: 01/06/2022 Duration: 24.02 mins Patient history: 81 year old male with history of seizures presented with altered mental status.  EEG to evaluate for seizure. Level of alertness: Awake AEDs during EEG study: LEV Technical aspects: This EEG study was done with scalp electrodes positioned according to the 10-20 International system of electrode placement. Electrical activity was reviewed with band pass filter of 1-70Hz , sensitivity of 7 uV/mm, display speed of 56mm/sec with a 60Hz  notched filter applied as appropriate. EEG data were recorded continuously and digitally stored.  Video monitoring was available and reviewed as appropriate. Description:  The posterior dominant rhythm consists of 8-9 Hz activity of moderate voltage (25-35 uV) seen predominantly in posterior head regions, symmetric and reactive to eye opening and eye closing.  Patient was noted to have near continuous lower jaw tremor. Concomitant EEG before, during and after the event did not show any EEG changes suggest seizure.  Hyperventilation and photic stimulation were not performed.    IMPRESSION: This study is within normal limits. No seizures or epileptiform discharges were seen throughout the recording.  Please note that lack of epileptiform abnormality does not exclude the diagnosis of epilepsy.  Priyanka Barbra Sarks   CT HEAD WO CONTRAST (5MM)  Result Date: 01/04/2022 CLINICAL DATA:  Neuro deficit, evaluate for stroke. EXAM: CT HEAD WITHOUT CONTRAST TECHNIQUE: Contiguous axial images were obtained from the base of the skull through the vertex without intravenous contrast. RADIATION DOSE REDUCTION: This exam was performed according to the departmental dose-optimization program which includes automated exposure control, adjustment of the mA and/or kV according to patient size and/or use of iterative reconstruction technique. COMPARISON:  Head CT 10/26/2021.  MRI brain 04/30/2021. FINDINGS: Brain: No  evidence of acute infarction, hemorrhage, hydrocephalus, extra-axial collection or mass lesion/mass effect. Again seen is mild diffuse atrophy. Vascular: Atherosclerotic calcifications are present within the cavernous internal carotid arteries. Skull: Normal. Negative for fracture or focal lesion. Sinuses/Orbits: No acute finding.  Other: None. IMPRESSION: 1. No acute intracranial process. 2. Stable mild diffuse atrophy. Electronically Signed   By: Ronney Asters M.D.   On: 01/04/2022 21:41   DG Abd 1 View  Result Date: 01/04/2022 CLINICAL DATA:  Respiratory failure peg tube placement EXAM: ABDOMEN - 1 VIEW COMPARISON:  10/26/2021 FINDINGS: Small amount of contrast within the colon. Gastrostomy tube projects over left upper quadrant/stomach. IVC filter to the right of the mid lumbar spine. Overall nonobstructed gas pattern. IMPRESSION: Gastrostomy tube projects over the left upper quadrant/stomach. Nonobstructed gas pattern. Electronically Signed   By: Donavan Foil M.D.   On: 01/04/2022 20:06   DG Chest Portable 1 View  Result Date: 01/04/2022 CLINICAL DATA:  Shortness of breath. EXAM: PORTABLE CHEST 1 VIEW COMPARISON:  Portable chest 11/01/2021. FINDINGS: Tracheostomy cannula remains in place with the tip 4.8 cm from the carina. There is a low inspiratory effort. There are bilateral increased infrahilar opacities which could be due to atelectasis or pneumonia. In the right mid lung there is patchy hazy infiltrate which is probably due to pneumonia. The remaining hypoexpanded lungs are essentially clear with COPD change, linear atelectatic bands in the left base. The cardiac size is normal. Stable mediastinum with aortic tortuosity, calcification and CABG change. Osteopenia and degenerative change thoracic spine. IMPRESSION: 1. Right mid lung opacities most likely due to pneumonia, and bilateral infrahilar opacities which could be due to atelectasis or pneumonia. Low inspiration. 2. COPD changes. 3. Aortic  atherosclerosis and CABG change. 4. Tracheostomy cannula remains in place. Electronically Signed   By: Telford Nab M.D.   On: 01/04/2022 20:04    Labs:  CBC: Recent Labs    11/01/21 0406 01/04/22 1940 01/05/22 0051 01/05/22 0232 01/06/22 0625  WBC 9.1 21.9*  --  15.4* 10.0  HGB 10.3* 11.9* 10.2* 10.9* 9.3*  HCT 30.2* 40.3 30.0* 35.7* 28.9*  PLT 349 440*  --  295 328    COAGS: Recent Labs    03/26/21 1036 05/08/21 1145 10/26/21 1508 01/04/22 1940  INR 1.1 1.0 1.2 1.3*  APTT 30  --  30 40*    BMP: Recent Labs    11/01/21 0406 01/04/22 1940 01/05/22 0051 01/05/22 0232 01/06/22 0625  NA 135 138 138 140 143  K 4.2 5.7* 3.8 4.5 3.0*  CL 102 97*  --  101 105  CO2 25 34*  --  28 31  GLUCOSE 136* 330*  --  81 118*  BUN 27* 25*  --  26* 31*  CALCIUM 9.0 9.6  --  9.3 8.6*  CREATININE 0.45* 0.71  --  0.49* 0.45*  GFRNONAA >60 >60  --  >60 >60    LIVER FUNCTION TESTS: Recent Labs    06/25/21 0435 08/03/21 2020 10/26/21 0819 01/04/22 1940  BILITOT 0.4 0.3 0.6 0.3  AST 31 29 37 31  ALT 42 45* 94* 35  ALKPHOS 143* 174* 184* 155*  PROT 7.9 8.3* 8.1 9.1*  ALBUMIN 3.7 3.4* 3.1* 2.8*     Assessment and Plan: G-tube leakage I was able to cinch external bumper snug with skin. Once snug, the tube was flushed easily with water, no evidence of leakage. Will check PEG injection/xray to confirm intragastric location. Unfortunately, this will be difficult to manage as the skin folds naturally want to push external bumper up the tube. This allows for laxity of the tube and thus the leakage. May be able to configure a way to keep external bumper secure.   Electronically Signed: Lennette Bihari  Antonious Omahoney, PA-C 01/06/2022, 11:26 AM   I spent a total of 20 in face to face in clinical consultation, greater than 50% of which was counseling/coordinating care for G-tube malfunction

## 2022-01-06 NOTE — Plan of Care (Signed)
Brief neuro update:  81 year old  presenting for episode of unresponsiveness. There is presumption that he might have had a seizure although nobody witnessed seizure. He was having a hard time ventilating when he was brought in. He also had an episode of vomiting.  Upon arrival to [facility] ED on 12/16, patient hypoxic in 70s and having trouble ventilating patient with high peak airway pressures. Patient had episode of vomiting. CXR showing RML opacities. Other vital stable. ABG 7.11, 119, 276, 37. WBC 21.   Workup with rEEG with no epileptogenic discharges, CT Head negative. Recent MRI from April 2023 negative.  HE is back to his baseline. Suspect that the episode of poor responsiveness was likely related to hypoxia. He improved with trach adjustment.  No further inpatient neurological workup. We will signoff.  [place] Pager Number [phone]

## 2022-01-06 NOTE — Progress Notes (Signed)
[facility] Progress Note Patient Name: Austin Davis DOB: 06-11-1940 MRN: [id number]   Date of Service  01/06/2022  HPI/Events of Note  Patient complaining of anxiety,   takes atarax 25mg  or xanax .5 every 8 at nursing facility,    has g-tube  eICU Interventions  Ordered one time dose of atarax 0.5 mg     Intervention Category Minor Interventions: Agitation / anxiety - evaluation and management  Judd Lien 01/06/2022, 6:45 AM

## 2022-01-06 NOTE — TOC Transition Note (Signed)
Transition of Care [facility]) - CM/SW Discharge Note   Patient Details  Name: Austin Davis MRN: [id number] Date of Birth: 1940-06-25  Transition of Care [facility] CM/SW Contact:  Milinda Antis, [place] Phone Number: 01/06/2022, 2:52 PM   Clinical Narrative:    Patient will DC to: [facility] Anticipated DC date: 01/06/2022 Family notified: Yes, daughter [name] Transport by: [facility]   Per MD patient ready for DC to SNF. RN to call report prior to discharge [phone] room 309). RN, patient, patient's family, and facility notified of DC. Discharge Summary sent to facility. DC packet on chart. Floor RN contacted [facility] to transport patient.   CSW will sign off for now as social work intervention is no longer needed. Please consult [place] again if new needs arise.    Final next level of care: Skilled Nursing Facility Barriers to Discharge: No Barriers Identified   Patient Goals and CMS Choice        Discharge Placement              [facility]) Patient to be transferred to facility by: [place] Name of family member notified: Rajan, [place] (Daughter) [phone] Patient and family notified of of transfer: 01/06/22  Discharge Plan and Services                                     Social Determinants of Health (SDOH) Interventions     Readmission Risk Interventions    06/26/2021   11:10 AM  Readmission Risk Prevention Plan  Transportation Screening Complete  Medication Review Press photographer) Complete  PCP or Specialist appointment within 3-5 days of discharge Complete  HRI or Home Care Consult Complete  SW Recovery Care/Counseling Consult Complete  [place] Complete

## 2022-01-06 NOTE — Procedures (Signed)
Patient Name: Austin Davis  MRN: [id number]  Epilepsy Attending: Lora Havens  Referring Physician/Provider: Kerney Elbe, MD  Date: 01/06/2022 Duration: 24.02 mins  Patient history: 81 year old male with history of seizures presented with altered mental status.  EEG to evaluate for seizure.  Level of alertness: Awake  AEDs during EEG study: LEV  Technical aspects: This EEG study was done with scalp electrodes positioned according to the 10-20 International system of electrode placement. Electrical activity was reviewed with band pass filter of 1-70Hz , sensitivity of 7 uV/mm, display speed of 29mm/sec with a 60Hz  notched filter applied as appropriate. EEG data were recorded continuously and digitally stored.  Video monitoring was available and reviewed as appropriate.  Description:  The posterior dominant rhythm consists of 8-9 Hz activity of moderate voltage (25-35 uV) seen predominantly in posterior head regions, symmetric and reactive to eye opening and eye closing.   Patient was noted to have near continuous lower jaw tremor. Concomitant EEG before, during and after the event did not show any EEG changes suggest seizure.   Hyperventilation and photic stimulation were not performed.      IMPRESSION: This study is within normal limits. No seizures or epileptiform discharges were seen throughout the recording.   Please note that lack of epileptiform abnormality does not exclude the diagnosis of epilepsy.   Acadia Thammavong Barbra Sarks

## 2022-01-06 NOTE — Progress Notes (Signed)
EEG complete - results pending 

## 2022-01-06 NOTE — Discharge Summary (Addendum)
Physician Discharge Summary  Patient ID: CALYB MCQUARRIE MRN: [id number] DOB/AGE: 81-Aug-1942 81 y.o.  Admit date: 01/04/2022 Discharge date: 01/06/2022    Discharge Diagnoses:  Principal Problem:   Acute on chronic respiratory failure ([place]) Active Problems:   Hyperkalemia   Encephalopathy acute   Seizure (HCC)   Acute respiratory failure with hypoxia (HCC)                             Brief Summary: CHAYNE BAUMGART is a 81 y.o. y/o male with a PMH of chronic respiratory failure vent dependent, tracheostomy in place, seizure, parkinsons, tremor, A-fib not on [facility] presents to [facility] ED on 12/16 for possible seizure.   Patient resides at [place] at 6 PM on 12/16.  Patient was found unresponsive.  Unknown baseline.  Last documented on last admission was able to follow commands on 10/2021.  Patient transferred to [facility] for further eval.   Upon arrival to [facility] ED on 12/16, patient remains unresponsive.  Patient hypoxic in 70s and having trouble ventilating patient with high peak airway pressures.  Patient had episode of vomiting.  CXR showing RML opacities.  He was started on meropenem. Other vital stable.  ABG 7.11, 119, 276, 37.  WBC 21.  CT head on hold due to hypoxia episode.  Neuro consulted.  EEG ordered.  PCCM consulted for ICU admission.  He was seen in consultation by neurology. EEG performed 12/18 and did not have evidence of seizure or epileptiform discharges. Pt improved with trach adjustment, Abx, limiting sedating medications, electrolyte correction. He is back to baseline mental status, tolerating vent.  IR evaluated PEG which was confirmed in intragastric location. Leak related to skin folds pushing bumper.                                  D/c plan by Discharge Diagnosis  Acute on Chronic Hypercarbic Respiratory Failure Ventilator Dependent / Chronic Tracheostomy   RML Consolidation Possible aspiration given vomiting episode prior to arrival; hx of recent MDR  Klebsiella pneumonia. Initially with severe acute respiratory acidosis.  Suspect this was largely due to trach dislodgement.  Urine strep negative.  -PRVC with LTVV  -wean PEEP / FiO2 for sats >90% -trach adjusted seems to have corrected ventilator / oxygen issues  -ABG reviewed, reduced rate to 16 -VAP prevention measures  -follow up tracheal aspirate, legionella    Sepsis ruled out  -antibiotics discontinued 12/18 with neg PCT. There was concern for possible PNA with presenting RML consolidation and changes in ventilation, however trach adjustment seems to have improved this.  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 Seizure Acute Encephalopathy: likely hypercarbia/hypoxia related Hx of seizure disorder Parkinson disease, tremors Much improved - back to baseline -follow neuro exam  -EEG without seizure  -limit sedating medications  -will add back PTA xanax at lower dose  -AED's per Neuro  -hold propanolol for now    Hyperkalemia Hypokalemia  -follow metabolic panel  -replete K, phos 12/18   Hyperglycemia Hgb A1c 5.5 03/2021 -SSI, sensitive scale   Anemia -follow CBC, transfuse for Hgb <7% or active bleeding    Possible new LBBB: lots of artifact b/c of baseline tremor -tele monitoring    Hx of paroxysmal afib Not on [facility] -tele monitoring  -rate controlled  Generalized failure to thrive, chronic malnutrition PEG tube leak -TF per Nutrition  -appreciate IR evaluation of PEG -- leak difficult to manage as is driven by skin folds pushing on bumper   Hypothyroidism TSH 1.783  -continue synthroid    Chronic Hypotension -midodrine    Chronic Anxiety -resume home xanax at half dose  -continue sertraline   Consults: Neuro   IR  Lines/tubes: Trach (chronic)>>  Microbiology/Sepsis markers: BC x 2 12/16>>> ngtd>>> Tracheal aspirate 12/16>>> rare GNR, rare GPR>>> RVP 12/16>> NEG    Significant Diagnostic Studies:  CT head 12/16>>> NEG acute    Vitals:   01/06/22 1400 01/06/22 1401 01/06/22 1402 01/06/22 1500  BP: (!) 162/89  (!) 162/89 (!) 177/85  Pulse: 85  85 91  Resp: 18  19 (!) 25  Temp:      TempSrc:      SpO2: 96% 95% 99% 94%  Weight:      Height:         Discharge Labs  BMET Recent Labs  Lab 01/04/22 1940 01/05/22 0051 01/05/22 0232 01/05/22 1701 01/06/22 0625  NA 138 138 140  --  143  K 5.7* 3.8 4.5  --  3.0*  CL 97*  --  101  --  105  CO2 34*  --  28  --  31  GLUCOSE 330*  --  81  --  118*  BUN 25*  --  26*  --  31*  CREATININE 0.71  --  0.49*  --  0.45*  CALCIUM 9.6  --  9.3  --  8.6*  MG  --   --  2.2 2.0 1.9  PHOS  --   --  3.0 3.3 2.3*     CBC  Recent Labs  Lab 01/04/22 1940 01/05/22 0051 01/05/22 0232 01/06/22 0625  HGB 11.9* 10.2* 10.9* 9.3*  HCT 40.3 30.0* 35.7* 28.9*  WBC 21.9*  --  15.4* 10.0  PLT 440*  --  295 328   Anti-Coagulation Recent Labs  Lab 01/04/22 1940  INR 1.3*            Allergies as of 01/06/2022       Reactions   Prednisone Other (See Comments)   Listed as allergy on MAR Unknown reaction   Cortisone Other (See Comments)   Listed as allergy per MAR Unknown reaction        Medication List     STOP taking these medications    propranolol 10 MG tablet Commonly known as: INDERAL       TAKE these medications    acetaminophen 325 MG tablet Commonly known as: TYLENOL Take 650 mg by mouth every 8 (eight) hours as needed for mild pain or fever.   ALPRAZolam 0.5 MG tablet Commonly known as: XANAX Place 0.5 mg into feeding tube every 8 (eight) hours as needed for anxiety.   CETAPHIL GENTLE CLEANSER EX Apply 10 mLs topically daily.   Culturelle Digestive Health Caps Give 1 capsule by tube daily. 10 billion   CULTURELLE PROBIOTICS PO Give 1 capsule by tube daily.   esomeprazole 40 MG capsule Commonly known as: NEXIUM Take 40 mg by mouth daily. Via feeding  tube   feeding supplement (OSMOLITE 1.5 CAL) Liqd Place 55 mL/hr into feeding tube See admin instructions. 55 ml/hr for 22-24 hours = 1815-1980 kcal & 1520-1603 ml   feeding supplement (JEVITY 1.5 CAL/FIBER) Liqd Place 1,000 mLs into feeding tube continuous.   Fish Oil 1000 MG Caps Take  1,000 mg by mouth daily.   folic acid 1 MG tablet Commonly known as: FOLVITE Take 1 tablet (1 mg total) by mouth daily. What changed: how to take this   furosemide 20 MG tablet Commonly known as: LASIX Place 1 tablet (20 mg total) into feeding tube daily as needed for fluid or edema. What changed: when to take this   hydrOXYzine 25 MG tablet Commonly known as: ATARAX Place 25 mg into feeding tube every 6 (six) hours as needed for anxiety.   lactulose 10 GM/15ML solution Commonly known as: CHRONULAC Place 20 g into feeding tube every 12 (twelve) hours as needed (constipation).   levalbuterol 1.25 MG/3ML nebulizer solution Commonly known as: XOPENEX Take 1.25 mg by nebulization every 6 (six) hours.   levETIRAcetam 100 MG/ML solution Commonly known as: KEPPRA Place 1,500 mg into feeding tube in the morning and at bedtime.   levothyroxine 25 MCG tablet Commonly known as: SYNTHROID Place 25 mcg into feeding tube daily.   LORazepam 2 MG/ML injection Commonly known as: ATIVAN 2 mg as needed (seizures).   midodrine 10 MG tablet Commonly known as: PROAMATINE Place 1 tablet (10 mg total) into feeding tube 3 (three) times daily with meals. What changed: when to take this   multivitamin with minerals Tabs tablet Place 1 tablet into feeding tube daily.   polyethylene glycol 17 g packet Commonly known as: MIRALAX / GLYCOLAX Place 17 g into feeding tube every other day.   sertraline 25 MG tablet Commonly known as: ZOLOFT Place 25 mg into feeding tube daily.   sterile water for irrigation Irrigate with 100 mLs as directed See admin instructions. 100 ml H2O flush every 4 hours    tamsulosin 0.4 MG Caps capsule Commonly known as: FLOMAX Take 0.4 mg by mouth daily. Via feeding tube          Disposition: Discharge disposition: 02-Transferred to [facility]       Discharged Condition: SAMMIE DENNER has met maximum benefit of inpatient care and is medically stable and cleared for discharge.  Patient is pending follow up as above.      Time spent on disposition:  Greater than 35 minutes.   Signed: Eliseo Gum MSN, AGACNP-BC [facility] 01/06/2022, 3:10 PM

## 2022-01-06 NOTE — Progress Notes (Signed)
NAME:  Austin Davis, MRN:  [id number], DOB:  1940-12-23, LOS: 2 ADMISSION DATE:  01/04/2022, CONSULTATION DATE:  12/16 REFERRING MD:  Dr. Zenia Resides, CHIEF COMPLAINT:  Seizure?   History of Present Illness:  Patient is a 81 year old male with pertinent PMH of chronic respiratory failure vent dependent, tracheostomy in place, seizure, parkinsons, tremor, A-fib not on [facility] presents to [facility] ED on 12/16 for possible seizure.  Patient resides at [place] at 6 PM on 12/16.  Patient was found unresponsive.  Unknown baseline.  Last documented on last admission was able to follow commands on 10/2021.  Patient transferred to [facility] for further eval.  Upon arrival to [facility] ED on 12/16, patient remains unresponsive.  Patient hypoxic in 70s and having trouble ventilating patient with high peak airway pressures.  Patient had episode of vomiting.  CXR showing RML opacities.  Other vital stable.  ABG 7.11, 119, 276, 37.  WBC 21.  CT head on hold due to hypoxia episode.  Neuro consulted.  EEG ordered.  PCCM consulted for ICU admission.   Other pertinent labs: Hemoglobin 11.9, K5.7, glucose 330  Pertinent  Medical History   Past Medical History:  Diagnosis Date   CHF (congestive heart failure) (HCC)    Coronary artery disease    Essential tremor    GI bleed 2019   hospitalized at [facility] for one week   Headache    since childhood   Low blood sugar    since childhood, controlled by diet   Mitral regurgitation    Mitral valve prolapse    Osteopenia 2021   Paroxysmal atrial fibrillation ([place])    Tremor    Significant Hospital Events: Including procedures, antibiotic start and stop dates in addition to other pertinent events   12/16 admitted to [facility] for possible seizure activity; neuro consulted; Initially having difficulty oxygenating patient; PCCM consulted. Trach repositioned, sats improved / vent mechanics improved.   Interim History / Subjective:  Near baseline  Afebrile  Wants  something for his "nerves"  EEG tech starting EEG  Objective   Blood pressure (!) 159/89, pulse 84, temperature 97.7 F (36.5 C), temperature source Axillary, resp. rate 19, height 5\' 10"  (1.778 m), weight 51.8 kg, SpO2 96 %.    Vent Mode: PRVC FiO2 (%):  [40 %] 40 % Set Rate:  [16 bmp-20 bmp] 16 bmp Vt Set:  [580 mL] 580 mL PEEP:  [5 cmH20] 5 cmH20 Plateau Pressure:  [21 cmH20-26 cmH20] 26 cmH20   Intake/Output Summary (Last 24 hours) at 01/06/2022 5329 Last data filed at 01/06/2022 0800 Gross per 24 hour  Intake 1120.37 ml  Output 625 ml  Net 495.37 ml   Filed Weights   01/04/22 2229 01/05/22 0500 01/06/22 0500  Weight: 50.5 kg 50.4 kg 51.8 kg    Examination: General: chronically ill appearing elderly male lying in bed on vent in NAD  HEENT: mm moist, trach c/d  Neuro: Awake, alert, appropriate in communication attempts, generalized weakness but non-focal, follows commands, communicates needs  CV: s1s2 RRR, no m/r/g PULM: resps even non labored on vent, diminished bases otherwise clear  GI: soft, bsx4 active, PEG  Extremities: warm/dry, no edema, muscle wasting Skin: no rashes or lesions  Resolved Hospital Problem list     Assessment & Plan:   Acute on Chronic Hypercarbic Respiratory Failure Ventilator Dependent / Chronic Tracheostomy   RML Consolidation Possible aspiration given vomiting episode prior to arrival; hx of recent MDR Klebsiella pneumonia. Initially with  severe acute respiratory acidosis.  Suspect this was largely due to trach dislodgement.  Urine strep negative.  -PRVC with LTVV  -wean PEEP / FiO2 for sats >90% -trach adjusted seems to have corrected ventilator / oxygen issues  -ABG reviewed, reduce rate to 16 -VAP prevention measures  -continue meropenem -follow up tracheal aspirate, legionella   Sepsis Secondary to above  -follow cultures  -meropenem  -follow PCT (largely negative), WBC / fever curve    Possible Generalized Tonic Clonic  Seizure Acute Encephalopathy: likely hypercarbia/hypoxia related Hx of seizure disorder Parkinson disease, tremors Much improved - back to baseline -follow neuro exam  -EEG pending  -limit sedating medications  -will add back PTA xanax at lower dose  -AED's per Neuro  -hold propanolol for now   Hyperkalemia Hypokalemia  -follow chem  -replete K, phos 12/18  Hyperglycemia Hgb A1c 5.5 03/2021 -SSI, sensitive scale  Anemia -follow CBC, transfuse for Hgb <7% or active bleeding   Possible new LBBB: lots of artifact b/c of baseline tremor -tele monitoring   Hx of paroxysmal afib Not on [facility] -tele monitoring  -rate controlled   Generalized failure to thrive, chronic malnutrition -TF per Nutrition  -PEG tube care per protocol   Hypothyroidism TSH 1.783  -continue synthroid    Chronic Hypotension -midodrine    Chronic Anxiety -resume home xanax at half dose  -continue sertraline   Best Practice (right click and "Reselect all SmartList Selections" daily)  Diet/type: tubefeeds and NPO DVT prophylaxis: SCD  GI prophylaxis: H2B Lines: N/A Foley:  N/A Code Status:  full code Last date of multidisciplinary goals of care discussion: pending  Dispo: may be able to go back to [facility] 12/18 pending EEG   Critical care time: 32 minutes    Nickolas Madrid, NP Pulmonary/Critical Care Medicine  01/06/2022  9:39 AM  Please see [email or web address] for pager details.   From 7A-7P if no response, please call [phone] After hours, please call ELink [phone]

## 2022-01-06 NOTE — TOC Progression Note (Signed)
Transition of Care [facility]) - Initial/Assessment Note    Patient Details  Name: Austin Davis MRN: [id number] Date of Birth: 30-Jul-1940  Transition of Care [facility]) CM/SW Contact:    Milinda Antis, [place] Phone Number: 01/06/2022, 1:31 PM  Clinical Narrative:                 13:30-  LCSW contacted Festus Barren with admissions at [facility] to inquire about the patient returning today as he is medically ready and is awaiting a response.         Patient Goals and CMS Choice        Expected Discharge Plan and Services                                                Prior Living Arrangements/Services                       Activities of Daily Living      Permission Sought/Granted                  Emotional Assessment              Admission diagnosis:  Seizure ([place]) [R56.9] Acute on chronic respiratory failure ([place]) [J96.20] Acute respiratory failure with hypoxia ([place]) [J96.01] Patient Active Problem List   Diagnosis Date Noted   Acute respiratory failure with hypoxia ([place]) 01/05/2022   Hyperkalemia 01/04/2022   Encephalopathy acute 01/04/2022   Seizure ([place]) 01/04/2022   Acute on chronic respiratory failure ([place]) 10/26/2021   Seizures ([place]) 06/26/2021   Tremor    Sepsis ([place]) 06/25/2021   Cardiac arrest due to respiratory disorder ([place]) 06/07/2021   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   Impaired quality of life 05/30/2021   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 05/15/2021   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[place]) 05/08/2021   Pneumonia 05/08/2021   Urinary tract infection associated with indwelling urethral catheter ([place]) 05/08/2021   Encephalopathy 05/08/2021   Pressure injury of skin 04/30/2021   Seizure disorder ([place]) 04/29/2021   Pneumothorax 04/11/2021   Ventilator dependent ([place])    Aspiration pneumonia ([place]) 03/25/2021   Parkinsonism 03/25/2021    Obstructive sleep apnea 10/24/2020   Chronic obstructive pulmonary disease ([place]) 10/24/2020   Chronic heart failure with preserved ejection fraction ([place]) 10/24/2020   Unintentional weight loss 09/20/2020   SVT (supraventricular tachycardia) 09/20/2020   Protein-calorie malnutrition, severe 09/17/2020   Acute on chronic respiratory failure with hypoxia and hypercapnia ([place]) 09/16/2020   Failure to thrive in adult 09/16/2020   Stage 1 skin ulcer of sacral region ([place]) 09/16/2020   Chronic hypotension 06/30/2020   History of GI bleed 02/17/2017   Rectal bleeding 02/16/2017   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 [place] [address] Phone: [phone] Fax: [phone]     Social Determinants of Health (SDOH) Interventions    Readmission Risk Interventions  06/26/2021   11:10 AM  Readmission Risk Prevention Plan  Transportation Screening Complete  Medication Review Press photographer) Complete  PCP or Specialist appointment within 3-5 days of discharge Complete  HRI or Home Care Consult Complete  SW Recovery Care/Counseling Consult Complete  [place] Complete

## 2022-01-06 NOTE — Progress Notes (Signed)
Report called to RN at [facility] for room 309

## 2022-01-08 LAB — LEGIONELLA PNEUMOPHILA SEROGP 1 UR AG: L. pneumophila Serogp 1 Ur Ag: NEGATIVE

## 2022-01-08 LAB — CULTURE, RESPIRATORY W GRAM STAIN

## 2022-01-08 LAB — URINE CULTURE: Culture: >=100000 — AB

## 2022-01-09 LAB — CULTURE, BLOOD (ROUTINE X 2)
Culture: NO GROWTH
Culture: NO GROWTH
Special Requests: ADEQUATE
Special Requests: ADEQUATE

## 2022-01-14 DIAGNOSIS — Z9911 Dependence on respirator [ventilator] status: Secondary | ICD-10-CM | POA: Diagnosis not present

## 2022-01-14 DIAGNOSIS — J9621 Acute and chronic respiratory failure with hypoxia: Secondary | ICD-10-CM | POA: Diagnosis not present

## 2022-01-14 DIAGNOSIS — R918 Other nonspecific abnormal finding of lung field: Secondary | ICD-10-CM | POA: Diagnosis not present

## 2022-01-14 DIAGNOSIS — R1084 Generalized abdominal pain: Secondary | ICD-10-CM | POA: Diagnosis not present

## 2022-01-22 DIAGNOSIS — R188 Other ascites: Secondary | ICD-10-CM | POA: Diagnosis not present

## 2022-01-22 DIAGNOSIS — Z931 Gastrostomy status: Secondary | ICD-10-CM | POA: Diagnosis not present

## 2022-01-23 DIAGNOSIS — J9621 Acute and chronic respiratory failure with hypoxia: Secondary | ICD-10-CM | POA: Diagnosis not present

## 2022-01-23 DIAGNOSIS — Z9911 Dependence on respirator [ventilator] status: Secondary | ICD-10-CM | POA: Diagnosis not present

## 2022-01-26 DIAGNOSIS — Z9911 Dependence on respirator [ventilator] status: Secondary | ICD-10-CM | POA: Diagnosis not present

## 2022-01-26 DIAGNOSIS — J9621 Acute and chronic respiratory failure with hypoxia: Secondary | ICD-10-CM | POA: Diagnosis not present

## 2022-01-30 DIAGNOSIS — K9423 Gastrostomy malfunction: Secondary | ICD-10-CM | POA: Diagnosis not present

## 2022-01-30 DIAGNOSIS — R1311 Dysphagia, oral phase: Secondary | ICD-10-CM | POA: Diagnosis not present

## 2022-01-30 DIAGNOSIS — R1312 Dysphagia, oropharyngeal phase: Secondary | ICD-10-CM | POA: Diagnosis not present

## 2022-01-31 ENCOUNTER — Emergency Department (HOSPITAL_COMMUNITY): Payer: Medicare Other

## 2022-01-31 ENCOUNTER — Encounter (HOSPITAL_COMMUNITY): Payer: Self-pay

## 2022-01-31 ENCOUNTER — Inpatient Hospital Stay (HOSPITAL_COMMUNITY)
Admission: EM | Admit: 2022-01-31 | Discharge: 2022-02-07 | DRG: 870 | Disposition: A | Discharge status: Other Acute Inpatient Hospital | Payer: Medicare Other | Source: Skilled Nursing Facility | Attending: Internal Medicine | Admitting: Internal Medicine

## 2022-01-31 ENCOUNTER — Other Ambulatory Visit: Payer: Self-pay

## 2022-01-31 DIAGNOSIS — Z8249 Family history of ischemic heart disease and other diseases of the circulatory system: Secondary | ICD-10-CM

## 2022-01-31 DIAGNOSIS — E872 Acidosis, unspecified: Secondary | ICD-10-CM | POA: Diagnosis present

## 2022-01-31 DIAGNOSIS — R627 Adult failure to thrive: Secondary | ICD-10-CM | POA: Diagnosis present

## 2022-01-31 DIAGNOSIS — B964 Proteus (mirabilis) (morganii) as the cause of diseases classified elsewhere: Secondary | ICD-10-CM | POA: Diagnosis present

## 2022-01-31 DIAGNOSIS — G40909 Epilepsy, unspecified, not intractable, without status epilepticus: Secondary | ICD-10-CM | POA: Diagnosis present

## 2022-01-31 DIAGNOSIS — J189 Pneumonia, unspecified organism: Secondary | ICD-10-CM | POA: Diagnosis not present

## 2022-01-31 DIAGNOSIS — J9621 Acute and chronic respiratory failure with hypoxia: Secondary | ICD-10-CM | POA: Diagnosis present

## 2022-01-31 DIAGNOSIS — F419 Anxiety disorder, unspecified: Secondary | ICD-10-CM | POA: Diagnosis present

## 2022-01-31 DIAGNOSIS — E43 Unspecified severe protein-calorie malnutrition: Secondary | ICD-10-CM | POA: Diagnosis present

## 2022-01-31 DIAGNOSIS — Z930 Tracheostomy status: Secondary | ICD-10-CM | POA: Diagnosis not present

## 2022-01-31 DIAGNOSIS — I4891 Unspecified atrial fibrillation: Secondary | ICD-10-CM | POA: Diagnosis not present

## 2022-01-31 DIAGNOSIS — R000 Tachycardia, unspecified: Secondary | ICD-10-CM | POA: Diagnosis not present

## 2022-01-31 DIAGNOSIS — Z681 Body mass index (BMI) 19 or less, adult: Secondary | ICD-10-CM

## 2022-01-31 DIAGNOSIS — Y848 Other medical procedures as the cause of abnormal reaction of the patient, or of later complication, without mention of misadventure at the time of the procedure: Secondary | ICD-10-CM | POA: Diagnosis present

## 2022-01-31 DIAGNOSIS — D638 Anemia in other chronic diseases classified elsewhere: Secondary | ICD-10-CM | POA: Diagnosis present

## 2022-01-31 DIAGNOSIS — I9589 Other hypotension: Secondary | ICD-10-CM | POA: Diagnosis present

## 2022-01-31 DIAGNOSIS — Z9911 Dependence on respirator [ventilator] status: Secondary | ICD-10-CM | POA: Diagnosis not present

## 2022-01-31 DIAGNOSIS — Z808 Family history of malignant neoplasm of other organs or systems: Secondary | ICD-10-CM

## 2022-01-31 DIAGNOSIS — J95851 Ventilator associated pneumonia: Secondary | ICD-10-CM | POA: Diagnosis not present

## 2022-01-31 DIAGNOSIS — Z823 Family history of stroke: Secondary | ICD-10-CM

## 2022-01-31 DIAGNOSIS — Z820 Family history of epilepsy and other diseases of the nervous system: Secondary | ICD-10-CM

## 2022-01-31 DIAGNOSIS — E039 Hypothyroidism, unspecified: Secondary | ICD-10-CM | POA: Diagnosis present

## 2022-01-31 DIAGNOSIS — Z888 Allergy status to other drugs, medicaments and biological substances status: Secondary | ICD-10-CM

## 2022-01-31 DIAGNOSIS — I2510 Atherosclerotic heart disease of native coronary artery without angina pectoris: Secondary | ICD-10-CM | POA: Diagnosis present

## 2022-01-31 DIAGNOSIS — R569 Unspecified convulsions: Secondary | ICD-10-CM | POA: Diagnosis not present

## 2022-01-31 DIAGNOSIS — N390 Urinary tract infection, site not specified: Secondary | ICD-10-CM | POA: Diagnosis present

## 2022-01-31 DIAGNOSIS — Z825 Family history of asthma and other chronic lower respiratory diseases: Secondary | ICD-10-CM

## 2022-01-31 DIAGNOSIS — L89152 Pressure ulcer of sacral region, stage 2: Secondary | ICD-10-CM | POA: Diagnosis present

## 2022-01-31 DIAGNOSIS — R54 Age-related physical debility: Secondary | ICD-10-CM | POA: Diagnosis present

## 2022-01-31 DIAGNOSIS — A419 Sepsis, unspecified organism: Secondary | ICD-10-CM | POA: Diagnosis not present

## 2022-01-31 DIAGNOSIS — B9620 Unspecified Escherichia coli [E. coli] as the cause of diseases classified elsewhere: Secondary | ICD-10-CM | POA: Diagnosis present

## 2022-01-31 DIAGNOSIS — R739 Hyperglycemia, unspecified: Secondary | ICD-10-CM | POA: Diagnosis present

## 2022-01-31 DIAGNOSIS — J449 Chronic obstructive pulmonary disease, unspecified: Secondary | ICD-10-CM | POA: Diagnosis not present

## 2022-01-31 DIAGNOSIS — J151 Pneumonia due to Pseudomonas: Secondary | ICD-10-CM | POA: Diagnosis not present

## 2022-01-31 DIAGNOSIS — Z1152 Encounter for screening for COVID-19: Secondary | ICD-10-CM

## 2022-01-31 DIAGNOSIS — Z931 Gastrostomy status: Secondary | ICD-10-CM

## 2022-01-31 DIAGNOSIS — Z79890 Hormone replacement therapy (postmenopausal): Secondary | ICD-10-CM

## 2022-01-31 DIAGNOSIS — Y95 Nosocomial condition: Secondary | ICD-10-CM | POA: Diagnosis present

## 2022-01-31 DIAGNOSIS — Z79899 Other long term (current) drug therapy: Secondary | ICD-10-CM

## 2022-01-31 DIAGNOSIS — Z1624 Resistance to multiple antibiotics: Secondary | ICD-10-CM

## 2022-01-31 DIAGNOSIS — G20A1 Parkinson's disease without dyskinesia, without mention of fluctuations: Secondary | ICD-10-CM | POA: Diagnosis present

## 2022-01-31 DIAGNOSIS — I5022 Chronic systolic (congestive) heart failure: Secondary | ICD-10-CM | POA: Diagnosis present

## 2022-01-31 DIAGNOSIS — K9423 Gastrostomy malfunction: Secondary | ICD-10-CM | POA: Diagnosis not present

## 2022-01-31 DIAGNOSIS — I480 Paroxysmal atrial fibrillation: Secondary | ICD-10-CM | POA: Diagnosis present

## 2022-01-31 DIAGNOSIS — J168 Pneumonia due to other specified infectious organisms: Secondary | ICD-10-CM | POA: Diagnosis not present

## 2022-01-31 DIAGNOSIS — R4182 Altered mental status, unspecified: Secondary | ICD-10-CM | POA: Diagnosis not present

## 2022-01-31 LAB — RESP PANEL BY RT-PCR (RSV, FLU A&B, COVID)  RVPGX2
Influenza A by PCR: NEGATIVE
Influenza B by PCR: NEGATIVE
Resp Syncytial Virus by PCR: NEGATIVE
SARS Coronavirus 2 by RT PCR: NEGATIVE

## 2022-01-31 MED ORDER — PIPERACILLIN-TAZOBACTAM 3.375 G IVPB 30 MIN
3.3750 g | Freq: Once | INTRAVENOUS | Status: AC
  Administered 2022-02-01: 3.375 g via INTRAVENOUS
  Filled 2022-01-31: qty 50

## 2022-01-31 MED ORDER — VANCOMYCIN HCL IN DEXTROSE 1-5 GM/200ML-% IV SOLN
1000.0000 mg | Freq: Once | INTRAVENOUS | Status: AC
  Administered 2022-02-01: 1000 mg via INTRAVENOUS
  Filled 2022-01-31: qty 200

## 2022-01-31 MED ORDER — LACTATED RINGERS IV BOLUS
1000.0000 mL | Freq: Once | INTRAVENOUS | Status: AC
  Administered 2022-02-01: 1000 mL via INTRAVENOUS

## 2022-01-31 NOTE — ED Provider Notes (Signed)
[facility] EMERGENCY DEPARTMENT Provider Note   CSN: [id number] Arrival date & time: 01/31/22  2052     History {Add pertinent medical, surgical, social history, OB history to HPI:1} Chief Complaint  Patient presents with   Seizures    Austin Davis is a 82 y.o. male.  Past medical history of chronic respiratory failure for which she has vent dependence with tracheostomy, seizure for which he takes Keppra 1000 mg twice daily, Parkinson's disease, tremor, A-fib not on anticoagulation.   Seizures      Home Medications Prior to Admission medications   Medication Sig Start Date End Date Taking? Authorizing Provider  acetaminophen (TYLENOL) 325 MG tablet Take 650 mg by mouth every 8 (eight) hours as needed for mild pain or fever.    [provider]  ALPRAZolam Prudy Feeler) 0.5 MG tablet Place 0.5 mg into feeding tube every 8 (eight) hours as needed for anxiety.    [provider]  esomeprazole (NEXIUM) 40 MG capsule Take 40 mg by mouth daily. Via feeding tube    [provider]  folic acid (FOLVITE) 1 MG tablet Take 1 tablet (1 mg total) by mouth daily. Patient taking differently: Place 1 mg into feeding tube daily. 09/29/20   Azucena Fallen, MD  furosemide (LASIX) 20 MG tablet Place 1 tablet (20 mg total) into feeding tube daily as needed for fluid or edema. Patient taking differently: Place 20 mg into feeding tube daily. 07/03/21   Almon Hercules, MD  hydrOXYzine (ATARAX) 25 MG tablet Place 25 mg into feeding tube every 6 (six) hours as needed for anxiety.    [provider]  Lactobacillus-Inulin (CULTURELLE DIGESTIVE HEALTH) CAPS Give 1 capsule by tube daily. 10 billion    [provider]  lactulose (CHRONULAC) 10 GM/15ML solution Place 20 g into feeding tube every 12 (twelve) hours as needed (constipation).    [provider]  levalbuterol Pauline Aus) 1.25 MG/3ML nebulizer solution Take 1.25 mg by nebulization  every 6 (six) hours.    [provider]  levETIRAcetam (KEPPRA) 100 MG/ML solution Place 1,500 mg into feeding tube in the morning and at bedtime.    [provider]  levothyroxine (SYNTHROID) 25 MCG tablet Place 25 mcg into feeding tube daily.    [provider]  LORazepam (ATIVAN) 2 MG/ML injection 2 mg as needed (seizures).    [provider]  midodrine (PROAMATINE) 10 MG tablet Place 1 tablet (10 mg total) into feeding tube 3 (three) times daily with meals. Patient taking differently: Place 10 mg into feeding tube 3 (three) times daily. 05/01/21   Leroy Sea, MD  Multiple Vitamin (MULTIVITAMIN WITH MINERALS) TABS tablet Place 1 tablet into feeding tube daily. 05/11/21   Marguerita Merles Latif, DO  Nutritional Supplements (FEEDING SUPPLEMENT, JEVITY 1.5 CAL/FIBER,) LIQD Place 1,000 mLs into feeding tube continuous. Patient not taking: Reported on 08/30/2021 05/10/21   Marguerita Merles Latif, DO  Nutritional Supplements (FEEDING SUPPLEMENT, OSMOLITE 1.5 CAL,) LIQD Place 55 mL/hr into feeding tube See admin instructions. 55 ml/hr for 22-24 hours = 1815-1980 kcal & 1520-1603 ml    [provider]  Omega-3 Fatty Acids (FISH OIL) 1000 MG CAPS Take 1,000 mg by mouth daily.    [provider]  polyethylene glycol (MIRALAX / GLYCOLAX) 17 g packet Place 17 g into feeding tube every other day.    [provider]  Probiotic Product (CULTURELLE PROBIOTICS PO) Give 1 capsule by tube daily.  [provider]  sertraline (ZOLOFT) 25 MG tablet Place 25 mg into feeding tube daily.    [provider]  Soap & Cleansers (CETAPHIL GENTLE CLEANSER EX) Apply 10 mLs topically daily.    [provider]  tamsulosin (FLOMAX) 0.4 MG CAPS capsule Take 0.4 mg by mouth daily. Via feeding tube    [provider]  Water For Irrigation, Sterile (STERILE WATER FOR IRRIGATION) Irrigate with 100 mLs as directed See admin instructions. 100  ml H2O flush every 4 hours    [provider]      Allergies    Prednisone and Cortisone    Review of Systems   Review of Systems  Neurological:  Positive for seizures.    Physical Exam Updated Vital Signs BP 123/82 (BP Location: Right Arm)   Pulse 99   Temp 97.8 F (36.6 C) (Oral)   Resp (!) 21   Ht 5\' 10"  (1.778 m)   Wt 51.8 kg   SpO2 98%   BMI 16.39 kg/m  Physical Exam  ED Results / Procedures / Treatments   Labs (all labs ordered are listed, but only abnormal results are displayed) Labs Reviewed - No data to display  EKG None  Radiology No results found.  Procedures Procedures  {Document cardiac monitor, telemetry assessment procedure when appropriate:1}  Medications Ordered in ED Medications - No data to display  ED Course/ Medical Decision Making/ A&P   {   Click here for ABCD2, HEART and other calculatorsREFRESH Note before signing :1}                          Medical Decision Making  Austin Davis is a 81 y.o. male with *** significant PMHx of seizures who presented to ***    Patient is*** actively seizing at this time.   Patient requried *** by EMS.   Here they required ***.  We loaded them with ***.   More than likely, seizure provoked by ***. Patient has *** been taking medications as directed.   No evidence of head injury (IPH, SAH, SDH, epidural). Patient is afebrile and without nuchal rigidity so doubt infectious etiology such as meningitis, encephalitis, or encephalopathy.  Doubt withdrawal, intoxication, hypo/hyperglycemia.   Patient is *** pregnant so doubt eclampsia.   Likely ***  Medications: ***  Since patient is back to baseline, will discharge home with follow up. Patient in agreement with plan.   The plan for this patient was discussed with Dr. ***, who voiced agreement and who oversaw evaluation and treatment of this patient.    {Document critical care time when appropriate:1} {Document review of labs and  clinical decision tools ie heart score, Chads2Vasc2 etc:1}  {Document your independent review of radiology images, and any outside records:1} {Document your discussion with family members, caretakers, and with consultants:1} {Document social determinants of health affecting pt's care:1} {Document your decision making why or why not admission, treatments were needed:1} Final Clinical Impression(s) / ED Diagnoses Final diagnoses:  None    Rx / DC Orders ED Discharge Orders     None

## 2022-01-31 NOTE — ED Triage Notes (Signed)
Pt BIB [facility] from [facility] with seizures. Pt has hx of seizures and takes Valproic acid for his seizures. Pt is vented with a trach. Staff at [facility] stated that the pt was still altered after the seizure was over.   EMS VS T 98.3 P 98 R 26 BP 127/86 O2 99%

## 2022-02-01 DIAGNOSIS — R627 Adult failure to thrive: Secondary | ICD-10-CM | POA: Diagnosis present

## 2022-02-01 DIAGNOSIS — B9620 Unspecified Escherichia coli [E. coli] as the cause of diseases classified elsewhere: Secondary | ICD-10-CM | POA: Diagnosis present

## 2022-02-01 DIAGNOSIS — E872 Acidosis, unspecified: Secondary | ICD-10-CM | POA: Diagnosis present

## 2022-02-01 DIAGNOSIS — Z930 Tracheostomy status: Secondary | ICD-10-CM

## 2022-02-01 DIAGNOSIS — J9621 Acute and chronic respiratory failure with hypoxia: Secondary | ICD-10-CM | POA: Diagnosis present

## 2022-02-01 DIAGNOSIS — Y848 Other medical procedures as the cause of abnormal reaction of the patient, or of later complication, without mention of misadventure at the time of the procedure: Secondary | ICD-10-CM | POA: Diagnosis present

## 2022-02-01 DIAGNOSIS — J189 Pneumonia, unspecified organism: Secondary | ICD-10-CM

## 2022-02-01 DIAGNOSIS — K9423 Gastrostomy malfunction: Secondary | ICD-10-CM | POA: Diagnosis not present

## 2022-02-01 DIAGNOSIS — G40909 Epilepsy, unspecified, not intractable, without status epilepticus: Secondary | ICD-10-CM | POA: Diagnosis present

## 2022-02-01 DIAGNOSIS — L89152 Pressure ulcer of sacral region, stage 2: Secondary | ICD-10-CM | POA: Diagnosis present

## 2022-02-01 DIAGNOSIS — Z9911 Dependence on respirator [ventilator] status: Secondary | ICD-10-CM | POA: Diagnosis not present

## 2022-02-01 DIAGNOSIS — I9589 Other hypotension: Secondary | ICD-10-CM | POA: Diagnosis present

## 2022-02-01 DIAGNOSIS — Z1624 Resistance to multiple antibiotics: Secondary | ICD-10-CM | POA: Diagnosis present

## 2022-02-01 DIAGNOSIS — R000 Tachycardia, unspecified: Secondary | ICD-10-CM | POA: Diagnosis not present

## 2022-02-01 DIAGNOSIS — R569 Unspecified convulsions: Secondary | ICD-10-CM | POA: Diagnosis present

## 2022-02-01 DIAGNOSIS — E43 Unspecified severe protein-calorie malnutrition: Secondary | ICD-10-CM | POA: Diagnosis present

## 2022-02-01 DIAGNOSIS — Z1152 Encounter for screening for COVID-19: Secondary | ICD-10-CM | POA: Diagnosis not present

## 2022-02-01 DIAGNOSIS — I4891 Unspecified atrial fibrillation: Secondary | ICD-10-CM | POA: Diagnosis not present

## 2022-02-01 DIAGNOSIS — G20A1 Parkinson's disease without dyskinesia, without mention of fluctuations: Secondary | ICD-10-CM | POA: Diagnosis present

## 2022-02-01 DIAGNOSIS — J95851 Ventilator associated pneumonia: Secondary | ICD-10-CM | POA: Diagnosis present

## 2022-02-01 DIAGNOSIS — A419 Sepsis, unspecified organism: Secondary | ICD-10-CM | POA: Diagnosis present

## 2022-02-01 DIAGNOSIS — N390 Urinary tract infection, site not specified: Secondary | ICD-10-CM | POA: Diagnosis present

## 2022-02-01 DIAGNOSIS — Y95 Nosocomial condition: Secondary | ICD-10-CM | POA: Diagnosis present

## 2022-02-01 DIAGNOSIS — I480 Paroxysmal atrial fibrillation: Secondary | ICD-10-CM | POA: Diagnosis present

## 2022-02-01 DIAGNOSIS — I5022 Chronic systolic (congestive) heart failure: Secondary | ICD-10-CM | POA: Diagnosis present

## 2022-02-01 DIAGNOSIS — Z681 Body mass index (BMI) 19 or less, adult: Secondary | ICD-10-CM | POA: Diagnosis not present

## 2022-02-01 DIAGNOSIS — D638 Anemia in other chronic diseases classified elsewhere: Secondary | ICD-10-CM | POA: Diagnosis present

## 2022-02-01 DIAGNOSIS — J151 Pneumonia due to Pseudomonas: Secondary | ICD-10-CM | POA: Diagnosis present

## 2022-02-01 DIAGNOSIS — E039 Hypothyroidism, unspecified: Secondary | ICD-10-CM | POA: Diagnosis present

## 2022-02-01 DIAGNOSIS — Z931 Gastrostomy status: Secondary | ICD-10-CM | POA: Diagnosis not present

## 2022-02-01 LAB — COMPREHENSIVE METABOLIC PANEL
ALT: 33 U/L (ref 0–44)
ALT: 39 U/L (ref 0–44)
AST: 21 U/L (ref 15–41)
AST: 24 U/L (ref 15–41)
Albumin: 1.6 g/dL — ABNORMAL LOW (ref 3.5–5.0)
Albumin: 2 g/dL — ABNORMAL LOW (ref 3.5–5.0)
Alkaline Phosphatase: 108 U/L (ref 38–126)
Alkaline Phosphatase: 120 U/L (ref 38–126)
Anion gap: 11 (ref 5–15)
Anion gap: 8 (ref 5–15)
BUN: 25 mg/dL — ABNORMAL HIGH (ref 8–23)
BUN: 34 mg/dL — ABNORMAL HIGH (ref 8–23)
CO2: 33 mmol/L — ABNORMAL HIGH (ref 22–32)
CO2: 34 mmol/L — ABNORMAL HIGH (ref 22–32)
Calcium: 8.4 mg/dL — ABNORMAL LOW (ref 8.9–10.3)
Calcium: 9.2 mg/dL (ref 8.9–10.3)
Chloride: 95 mmol/L — ABNORMAL LOW (ref 98–111)
Chloride: 95 mmol/L — ABNORMAL LOW (ref 98–111)
Creatinine, Ser: 0.42 mg/dL — ABNORMAL LOW (ref 0.61–1.24)
Creatinine, Ser: 0.47 mg/dL — ABNORMAL LOW (ref 0.61–1.24)
GFR, Estimated: >60 mL/min (ref >60)
GFR, Estimated: >60 mL/min (ref >60)
Glucose, Bld: 102 mg/dL — ABNORMAL HIGH (ref 70–99)
Glucose, Bld: 157 mg/dL — ABNORMAL HIGH (ref 70–99)
Potassium: 4.2 mmol/L (ref 3.5–5.1)
Potassium: 5.1 mmol/L (ref 3.5–5.1)
Sodium: 137 mmol/L (ref 135–145)
Sodium: 139 mmol/L (ref 135–145)
Total Bilirubin: 0.3 mg/dL (ref 0.3–1.2)
Total Bilirubin: 0.4 mg/dL (ref 0.3–1.2)
Total Protein: 6.3 g/dL — ABNORMAL LOW (ref 6.5–8.1)
Total Protein: 7.6 g/dL (ref 6.5–8.1)

## 2022-02-01 LAB — CBC WITH DIFFERENTIAL/PLATELET
Abs Immature Granulocytes: 0.16 10*3/uL — ABNORMAL HIGH (ref 0.00–0.07)
Basophils Absolute: 0.1 10*3/uL (ref 0.0–0.1)
Basophils Relative: 0 %
Eosinophils Absolute: 0 10*3/uL (ref 0.0–0.5)
Eosinophils Relative: 0 %
HCT: 35.5 % — ABNORMAL LOW (ref 39.0–52.0)
Hemoglobin: 10.4 g/dL — ABNORMAL LOW (ref 13.0–17.0)
Immature Granulocytes: 1 %
Lymphocytes Relative: 1 %
Lymphs Abs: 0.1 10*3/uL — ABNORMAL LOW (ref 0.7–4.0)
MCH: 26 pg (ref 26.0–34.0)
MCHC: 29.3 g/dL — ABNORMAL LOW (ref 30.0–36.0)
MCV: 88.8 fL (ref 80.0–100.0)
Monocytes Absolute: 0.7 10*3/uL (ref 0.1–1.0)
Monocytes Relative: 3 %
Neutro Abs: 25.3 10*3/uL — ABNORMAL HIGH (ref 1.7–7.7)
Neutrophils Relative %: 95 %
Platelets: 457 10*3/uL — ABNORMAL HIGH (ref 150–400)
RBC: 4 MIL/uL — ABNORMAL LOW (ref 4.22–5.81)
RDW: 20.2 % — ABNORMAL HIGH (ref 11.5–15.5)
WBC: 26.4 10*3/uL — ABNORMAL HIGH (ref 4.0–10.5)
nRBC: 0 % (ref 0.0–0.2)

## 2022-02-01 LAB — I-STAT VENOUS BLOOD GAS, ED
Acid-Base Excess: 11 mmol/L — ABNORMAL HIGH (ref 0.0–2.0)
Bicarbonate: 39.3 mmol/L — ABNORMAL HIGH (ref 20.0–28.0)
Calcium, Ion: 1.22 mmol/L (ref 1.15–1.40)
HCT: 35 % — ABNORMAL LOW (ref 39.0–52.0)
Hemoglobin: 11.9 g/dL — ABNORMAL LOW (ref 13.0–17.0)
O2 Saturation: 41 %
Potassium: 5.2 mmol/L — ABNORMAL HIGH (ref 3.5–5.1)
Sodium: 140 mmol/L (ref 135–145)
TCO2: 41 mmol/L — ABNORMAL HIGH (ref 22–32)
pCO2, Ven: 70.4 mmHg (ref 44–60)
pH, Ven: 7.355 (ref 7.25–7.43)
pO2, Ven: 26 mmHg — CL (ref 32–45)

## 2022-02-01 LAB — LACTIC ACID, PLASMA
Lactic Acid, Venous: 3.2 mmol/L (ref 0.5–1.9)
Lactic Acid, Venous: 2 mmol/L (ref 0.5–1.9)
Lactic Acid, Venous: 1.5 mmol/L (ref 0.5–1.9)

## 2022-02-01 LAB — I-STAT CHEM 8, ED
BUN: 35 mg/dL — ABNORMAL HIGH (ref 8–23)
Calcium, Ion: 1.22 mmol/L (ref 1.15–1.40)
Chloride: 94 mmol/L — ABNORMAL LOW (ref 98–111)
Creatinine, Ser: 0.5 mg/dL — ABNORMAL LOW (ref 0.61–1.24)
Glucose, Bld: 154 mg/dL — ABNORMAL HIGH (ref 70–99)
HCT: 35 % — ABNORMAL LOW (ref 39.0–52.0)
Hemoglobin: 11.9 g/dL — ABNORMAL LOW (ref 13.0–17.0)
Potassium: 5.2 mmol/L — ABNORMAL HIGH (ref 3.5–5.1)
Sodium: 139 mmol/L (ref 135–145)
TCO2: 36 mmol/L — ABNORMAL HIGH (ref 22–32)

## 2022-02-01 LAB — URINALYSIS, ROUTINE W REFLEX MICROSCOPIC
Bilirubin Urine: NEGATIVE
Glucose, UA: NEGATIVE mg/dL
Hgb urine dipstick: NEGATIVE
Ketones, ur: NEGATIVE mg/dL
Nitrite: NEGATIVE
Protein, ur: 100 mg/dL — AB
Specific Gravity, Urine: 1.017 (ref 1.005–1.030)
WBC, UA: >50 WBC/hpf — ABNORMAL HIGH (ref 0–5)
pH: 8 (ref 5.0–8.0)

## 2022-02-01 LAB — CBC
HCT: 28.3 % — ABNORMAL LOW (ref 39.0–52.0)
Hemoglobin: 8.5 g/dL — ABNORMAL LOW (ref 13.0–17.0)
MCH: 26.4 pg (ref 26.0–34.0)
MCHC: 30 g/dL (ref 30.0–36.0)
MCV: 87.9 fL (ref 80.0–100.0)
Platelets: 370 10*3/uL (ref 150–400)
RBC: 3.22 MIL/uL — ABNORMAL LOW (ref 4.22–5.81)
RDW: 20 % — ABNORMAL HIGH (ref 11.5–15.5)
WBC: 22.5 10*3/uL — ABNORMAL HIGH (ref 4.0–10.5)
nRBC: 0 % (ref 0.0–0.2)

## 2022-02-01 LAB — GLUCOSE, CAPILLARY
Glucose-Capillary: 135 mg/dL — ABNORMAL HIGH (ref 70–99)
Glucose-Capillary: 102 mg/dL — ABNORMAL HIGH (ref 70–99)
Glucose-Capillary: 90 mg/dL (ref 70–99)
Glucose-Capillary: 127 mg/dL — ABNORMAL HIGH (ref 70–99)

## 2022-02-01 LAB — PROCALCITONIN: Procalcitonin: 0.39 ng/mL

## 2022-02-01 LAB — PHOSPHORUS: Phosphorus: 2.5 mg/dL (ref 2.5–4.6)

## 2022-02-01 LAB — AMMONIA: Ammonia: 35 umol/L (ref 9–35)

## 2022-02-01 LAB — MRSA NEXT GEN BY PCR, NASAL: MRSA by PCR Next Gen: NOT DETECTED

## 2022-02-01 LAB — MAGNESIUM: Magnesium: 1.7 mg/dL (ref 1.7–2.4)

## 2022-02-01 MED ORDER — SODIUM CHLORIDE 0.9 % IV SOLN
1.0000 g | Freq: Two times a day (BID) | INTRAVENOUS | Status: DC
  Administered 2022-02-01: 1 g via INTRAVENOUS
  Filled 2022-02-01: qty 20

## 2022-02-01 MED ORDER — ACETAMINOPHEN 325 MG PO TABS
650.0000 mg | ORAL_TABLET | ORAL | Status: DC | PRN
  Administered 2022-02-03 – 2022-02-06 (×2): 650 mg
  Filled 2022-02-01 (×2): qty 2

## 2022-02-01 MED ORDER — MIDODRINE HCL 5 MG PO TABS
10.0000 mg | ORAL_TABLET | Freq: Three times a day (TID) | ORAL | Status: DC
  Administered 2022-02-01 – 2022-02-07 (×20): 10 mg
  Filled 2022-02-01 (×20): qty 2

## 2022-02-01 MED ORDER — JUVEN PO PACK
1.0000 | PACK | Freq: Two times a day (BID) | ORAL | Status: DC
  Administered 2022-02-01 – 2022-02-07 (×12): 1
  Filled 2022-02-01 (×11): qty 1

## 2022-02-01 MED ORDER — ENOXAPARIN SODIUM 40 MG/0.4ML IJ SOSY
40.0000 mg | PREFILLED_SYRINGE | INTRAMUSCULAR | Status: DC
  Administered 2022-02-01 – 2022-02-07 (×7): 40 mg via SUBCUTANEOUS
  Filled 2022-02-01 (×7): qty 0.4

## 2022-02-01 MED ORDER — LEVOTHYROXINE SODIUM 50 MCG PO TABS
25.0000 ug | ORAL_TABLET | Freq: Every day | ORAL | Status: DC
  Administered 2022-02-01 – 2022-02-07 (×7): 25 ug
  Filled 2022-02-01 (×7): qty 1

## 2022-02-01 MED ORDER — LORAZEPAM 2 MG/ML IJ SOLN: 2.0000 mg | INTRAMUSCULAR | Status: DC | PRN

## 2022-02-01 MED ORDER — TAMSULOSIN HCL 0.4 MG PO CAPS
0.4000 mg | ORAL_CAPSULE | Freq: Every day | ORAL | Status: DC
  Administered 2022-02-01: 0.4 mg via ORAL
  Filled 2022-02-01 (×2): qty 1

## 2022-02-01 MED ORDER — DOCUSATE SODIUM 50 MG/5ML PO LIQD: 100.0000 mg | Freq: Two times a day (BID) | ORAL | Status: DC | PRN

## 2022-02-01 MED ORDER — LEVETIRACETAM 500 MG PO TABS
1500.0000 mg | ORAL_TABLET | Freq: Two times a day (BID) | ORAL | Status: DC
  Administered 2022-02-01 – 2022-02-02 (×4): 1500 mg
  Filled 2022-02-01 (×5): qty 3

## 2022-02-01 MED ORDER — LEVALBUTEROL HCL 1.25 MG/0.5ML IN NEBU: 1.2500 mg | INHALATION_SOLUTION | Freq: Four times a day (QID) | RESPIRATORY_TRACT | Status: DC | PRN

## 2022-02-01 MED ORDER — PROSOURCE TF20 ENFIT COMPATIBL EN LIQD
60.0000 mL | Freq: Every day | ENTERAL | Status: DC
  Administered 2022-02-01 – 2022-02-04 (×4): 60 mL
  Filled 2022-02-01 (×4): qty 60

## 2022-02-01 MED ORDER — FOLIC ACID 1 MG PO TABS
1.0000 mg | ORAL_TABLET | Freq: Every day | ORAL | Status: DC
  Administered 2022-02-01 – 2022-02-07 (×7): 1 mg
  Filled 2022-02-01 (×7): qty 1

## 2022-02-01 MED ORDER — LINEZOLID 600 MG/300ML IV SOLN
600.0000 mg | Freq: Two times a day (BID) | INTRAVENOUS | Status: DC
  Administered 2022-02-01 – 2022-02-03 (×5): 600 mg via INTRAVENOUS
  Filled 2022-02-01 (×5): qty 300

## 2022-02-01 MED ORDER — POLYETHYLENE GLYCOL 3350 17 G PO PACK: 17.0000 g | PACK | Freq: Every day | ORAL | Status: DC | PRN

## 2022-02-01 MED ORDER — LEVETIRACETAM IN NACL 1500 MG/100ML IV SOLN
1500.0000 mg | Freq: Two times a day (BID) | INTRAVENOUS | Status: DC
  Filled 2022-02-01: qty 100

## 2022-02-01 MED ORDER — SERTRALINE HCL 50 MG PO TABS
25.0000 mg | ORAL_TABLET | Freq: Every day | ORAL | Status: DC
  Administered 2022-02-01 – 2022-02-07 (×7): 25 mg
  Filled 2022-02-01 (×7): qty 1

## 2022-02-01 MED ORDER — FAMOTIDINE 20 MG PO TABS
20.0000 mg | ORAL_TABLET | Freq: Two times a day (BID) | ORAL | Status: DC
  Administered 2022-02-01 – 2022-02-07 (×13): 20 mg
  Filled 2022-02-01 (×13): qty 1

## 2022-02-01 MED ORDER — CHLORHEXIDINE GLUCONATE CLOTH 2 % EX PADS
6.0000 | MEDICATED_PAD | Freq: Every day | CUTANEOUS | Status: DC
  Administered 2022-02-01 – 2022-02-06 (×8): 6 via TOPICAL

## 2022-02-01 MED ORDER — ALPRAZOLAM 0.5 MG PO TABS
0.5000 mg | ORAL_TABLET | Freq: Three times a day (TID) | ORAL | Status: DC
  Administered 2022-02-01 – 2022-02-07 (×19): 0.5 mg
  Filled 2022-02-01 (×19): qty 1

## 2022-02-01 MED ORDER — OSMOLITE 1.5 CAL PO LIQD
1000.0000 mL | ORAL | Status: DC
  Administered 2022-02-01 – 2022-02-03 (×3): 1000 mL
  Filled 2022-02-01: qty 1000

## 2022-02-01 MED ORDER — ORAL CARE MOUTH RINSE
15.0000 mL | OROMUCOSAL | Status: DC
  Administered 2022-02-01 – 2022-02-07 (×70): 15 mL via OROMUCOSAL

## 2022-02-01 MED ORDER — ORAL CARE MOUTH RINSE: 15.0000 mL | OROMUCOSAL | Status: DC | PRN

## 2022-02-01 MED ORDER — LACTATED RINGERS IV BOLUS
1000.0000 mL | Freq: Once | INTRAVENOUS | Status: AC
  Administered 2022-02-01: 1000 mL via INTRAVENOUS

## 2022-02-01 MED ORDER — SODIUM CHLORIDE 0.9 % IV SOLN
1.0000 g | Freq: Three times a day (TID) | INTRAVENOUS | Status: DC
  Administered 2022-02-01 – 2022-02-04 (×9): 1 g via INTRAVENOUS
  Filled 2022-02-01 (×10): qty 20

## 2022-02-01 NOTE — Progress Notes (Signed)
Pharmacy Antibiotic Note  Austin Davis is a 82 y.o. male admitted on 01/31/2022 with pneumonia and hx VRE and ESBL. Pharmacy has been consulted for Merrem dosing. WBC elevated. Renal function ok.   Plan: Merrem 1g IV q8h Zyvox per MD Trend WBC, temp, renal function  F/U infectious work-up  Height: 5\' 10"  (177.8 cm) Weight: 51.8 kg (114 lb 3.2 oz) IBW/kg (Calculated) : 73  Temp (24hrs), Avg:98.6 F (37 C), Min:97.8 F (36.6 C), Max:99.1 F (37.3 C)  Recent Labs  Lab 02/01/22 0004 02/01/22 0012 02/01/22 0535 02/01/22 0701  WBC 26.4*  --  22.5*  --   CREATININE 0.47* 0.50* 0.42*  --   LATICACIDVEN 3.2*  --   --  2.0*     Estimated Creatinine Clearance: 53.1 mL/min (A) (by C-G formula based on SCr of 0.42 mg/dL (L)).    Allergies  Allergen Reactions   Prednisone Other (See Comments)    Listed as allergy on MAR Unknown reaction   Cortisone Other (See Comments)    Listed as allergy per MAR Unknown reaction    02/03/22, PharmD, BCPS, [facility] Clinical Pharmacist [phone] Please check AMION for all [facility] Pharmacy numbers 02/01/2022

## 2022-02-01 NOTE — Progress Notes (Signed)
Pharmacy Antibiotic Note  Austin Davis is a 82 y.o. male admitted on 01/31/2022 with pneumonia. Also recent ESBL UTI. Pharmacy has been consulted for Merrem dosing. WBC elevated. Renal function ok.   Plan: Merrem 1g IV q12h Zyvox per MD Trend WBC, temp, renal function  F/U infectious work-up  Height: 5\' 10"  (177.8 cm) Weight: 51.8 kg (114 lb 3.2 oz) IBW/kg (Calculated) : 73  Temp (24hrs), Avg:98 F (36.7 C), Min:97.8 F (36.6 C), Max:98.2 F (36.8 C)  Recent Labs  Lab 02/01/22 0004 02/01/22 0012  WBC 26.4*  --   CREATININE 0.47* 0.50*  LATICACIDVEN 3.2*  --     Estimated Creatinine Clearance: 53.1 mL/min (A) (by C-G formula based on SCr of 0.5 mg/dL (L)).    Allergies  Allergen Reactions   Prednisone Other (See Comments)    Listed as allergy on MAR Unknown reaction   Cortisone Other (See Comments)    Listed as allergy per MAR Unknown reaction   02/03/22, PharmD, BCPS Clinical Pharmacist Phone: [phone]

## 2022-02-01 NOTE — Progress Notes (Signed)
Initial Nutrition Assessment  DOCUMENTATION CODES:   Underweight  INTERVENTION:  Initiate tube feeding via PEG: Start Osmolite 1.5 at 90ml and advance by 72ml q4h to a goal rate of  50 ml/h (1200 ml per day) Prosource TF20 60 ml once daily  Provides 1880 kcal, 95 gm protein, 914 ml free water daily  Juven BID per tube to support wound healing  NUTRITION DIAGNOSIS:   Increased nutrient needs related to acute illness as evidenced by estimated needs.  GOAL:   Patient will meet greater than or equal to 90% of their needs  MONITOR:   Labs, Vent status, Weight trends, TF tolerance  REASON FOR ASSESSMENT:   Consult Enteral/tube feeding initiation and management  ASSESSMENT:   Pt admitted from [facility] with increased respiratory distress d/t concern for PNA. PMH significant for chronic respiratory failure, vent dependence, trach, PEG, seizure, Parkinson's disease, afib.  Patient is currently on ventilator support MV: 13 L/min Temp (24hrs), Avg:98.6 F (37 C), Min:97.8 F (36.6 C), Max:99.1 F (37.3 C)  RD working remotely. Unable to assess pt at bedside at this time. Pt has been seen by RD's during multiple prior admissions  Reviewed list of home meds. It seems as though pt has been receiving Osmolite 1.5 @ 58ml for 22-24 hours  Current weight consistent with weight documented on 01/06/22 at 51.8 kg. It is likely that current weight is pulled forward from last documented weight. Within the last year his weight has decreased from 62.6 kg (03/26/21) down to 51.8 kg. This is a weight loss of 17.3% which is clinically significant for time frame.   Given significant weight loss, underweight status, and history of severe malnutrition, highly suspect a degree of malnutrition present although unable to confirm at this time given limited history and physical exam.   Medications: pepcid, folvite  Labs: BUN 25, Cr 0.42, CBG's 102, 135  UOP: x12 hours I/O's: +756ml since  admit    NUTRITION - FOCUSED PHYSICAL EXAM: RD working remotely. Deferred to follow up.   Diet Order:   Diet Order             Diet NPO time specified  Diet effective now                   EDUCATION NEEDS:   No education needs have been identified at this time  Skin:  Skin Assessment: Skin Integrity Issues: Skin Integrity Issues:: Stage II Stage II: L buttocks  Last BM:  unknown  Height:   Ht Readings from Last 1 Encounters:  01/31/22 5\' 10"  (1.778 m)    Weight:   Wt Readings from Last 1 Encounters:  01/31/22 51.8 kg   BMI:  Body mass index is 16.39 kg/m.  Estimated Nutritional Needs:   Kcal:  1700-1900  Protein:  85-95g  Fluid:  >/=1.7L  04/01/22, RDN, LDN Clinical Nutrition

## 2022-02-01 NOTE — ED Provider Notes (Signed)
  Physical Exam  BP 112/71   Pulse (!) 104   Temp 98.2 F (36.8 C) (Oral)   Resp (!) 24   Ht 5\' 10"  (1.778 m)   Wt 51.8 kg   SpO2 98%   BMI 16.39 kg/m   Physical Exam  Procedures  Procedures  ED Course / MDM   Clinical Course as of 02/01/22 0303  Sat Feb 01, 2022  0017 RT called by this provider, requested increase in RR for pCO2 of > 70 on VBG.  [RS]  0113 Critical lactic of 3.2.  [RS]  0300 Consult to critical care Dr. [id number] who is agreeable to admitting this patient to his service. I appreciate his collaboration in the care of this patient. [RS]    Clinical Course User Index [RS] Marley Pakula, Tonia Brooms, PA-C   Medical Decision Making Amount and/or Complexity of Data Reviewed Labs: ordered.    Details: CBC with leukocytosis of 26,000, anemia with hemoglobin of 10, CMP with elevated BUN to 34, lactic acid elevated to 3.2, VBG with pCO2 of 70, RT instructed to increase rate to 24 breaths/min.  Patient required mission the hospital for vent associated pneumonia.  Critical care consult pending. Radiology: ordered.  Risk Prescription drug management. Decision regarding hospitalization.    Care of the patient soon from preceding ED resident physician Dr. Eugene Gavia, MD.  Please see his associated note for further insight and the patient's ED course.  In brief patient is an 82 year old male who is chronically on ventilator with tracheostomy coming from [facility] after breakthrough seizure at his facility despite compliance with Keppra. Patient is not anticoagulation for A-fib.   Per preceding ED provider, patient will require admission for VAP, pending labs at time of shift change in order to call for admission.   Currently on PEEP of 5, FiO2 of 60%, RR increased from 20-24.   Consult to critical care as above, patient admitted to the ICU.   This chart was dictated using voice recognition software, Dragon. Despite the best efforts of this provider to proofread and  correct errors, errors may still occur which can change documentation meaning.     [facility] 02/01/22 0303    Palumbo, April, MD 02/01/22 [phone]

## 2022-02-01 NOTE — Sepsis Progress Note (Signed)
Following per sepsis protocol  ? ?

## 2022-02-01 NOTE — Sepsis Progress Note (Signed)
Secure chat sent at 0301 to beside nurse inquiring about blood cultures. Reply received that antibiotics not delayed and were given prior to obtaining blood culture.

## 2022-02-01 NOTE — H&P (Signed)
NAME:  Austin Davis, MRN:  [id number], DOB:  November 11, 1940, LOS: 0 ADMISSION DATE:  01/31/2022, CONSULTATION DATE: 02/01/2021 REFERRING MD: Santa Lighter pa, CHIEF COMPLAINT: Pneumonia  History of Present Illness:  This is a 82 year old gentleman past medical history of chronic respiratory failure, vent dependence, tracheostomy tube in place, history of seizure, Parkinson disease, A-fib not on AC.  Presents from [facility] with increased respiratory distress requiring vent support, bilateral infiltrates on chest x-ray concerning for pneumonia.  Previous culture results have reviewed revealed VRE and ESBL organisms.  He presents today with an elevated white count concern for sepsis.  Pertinent  Medical History   Past Medical History:  Diagnosis Date   CHF (congestive heart failure) (HCC)    Coronary artery disease    Essential tremor    GI bleed 2019   hospitalized at [facility] for one week   Headache    since childhood   Low blood sugar    since childhood, controlled by diet   Mitral regurgitation    Mitral valve prolapse    Osteopenia 2021   Paroxysmal atrial fibrillation (HCC)    Tremor      Significant Hospital Events: Including procedures, antibiotic start and stop dates in addition to other pertinent events     Interim History / Subjective:  Per HPI above, baseline nonverbal  Objective   Blood pressure 97/63, pulse (!) 103, temperature 98.2 F (36.8 C), temperature source Oral, resp. rate (!) 25, height 5\' 10"  (1.778 m), weight 51.8 kg, SpO2 95 %.    Vent Mode: PCV FiO2 (%):  [60 %] 60 % Set Rate:  [20 bmp-24 bmp] 24 bmp PEEP:  [5 cmH20] 5 cmH20 Plateau Pressure:  [25 cmH20-26 cmH20] 26 cmH20   Intake/Output Summary (Last 24 hours) at 02/01/2022 0340 Last data filed at 02/01/2022 0254 Gross per 24 hour  Intake 1198.84 ml  Output --  Net 1198.84 ml   Filed Weights   01/31/22 2103  Weight: 51.8 kg    Examination: General: Opens eyes, tracks, blinks  otherwise not responsive chronically ill-appearing elderly gentleman tracheostomy tube dependent on mechanical vent support HENT: Opens eyes, blinks, attempts to track Lungs: Bilateral mechanically ventilated breath sounds, rhonchi present Cardiovascular: Regular rhythm S1-S2 Abdomen: Soft nontender nondistended, PEG tube in place Extremities: No significant edema Neuro: Alert oriented following commands GU: Deferred  Resolved Hospital Problem list     Assessment & Plan:   Possible ventilator associated pneumonia, present on admission Sepsis secondary to above History of MDR organisms, VRE and ESBL, Enterococcus, Klebsiella and Pseudomonas Chronic ventilator dependence, acute on chronic hypoxemic respiratory failure requiring vent support Tracheostomy tube in place PEG tube in place Seizure disorder, history of multiple breakthrough seizures in the past,  Plan: Broad-spectrum antibiotic coverage Linezolid plus meropenem Consult to pharmacy Will likely need infectious disease input, consider consultation in morning. EEG, no obvious seizure-like activity at this time Continue Keppra Continue home dose scheduled Xanax Repeat cultures pending, blood and urine  Contact precautions for MDR  Parkinsons disease  Tremors - chronic issues   Anemia  - likely of chronic disease  - no sign of active bleeding   H/o of PAF Not on AC   Hypothyroidism  - continue synthroid  Chronic hypotension  - continue midodrine   Anxiety  - continue xanax - sertraline   Best Practice (right click and "Reselect all SmartList Selections" daily)   Diet/type: NPO DVT prophylaxis: prophylactic heparin  GI prophylaxis: H2B Lines: N/A Foley:  N/A Code Status:  full code Last date of multidisciplinary goals of care discussion [we will reach out to family]  Labs   CBC: Recent Labs  Lab 02/01/22 0004 02/01/22 0012 02/01/22 0013  WBC 26.4*  --   --   NEUTROABS 25.3*  --   --   HGB  10.4* 11.9* 11.9*  HCT 35.5* 35.0* 35.0*  MCV 88.8  --   --   PLT 457*  --   --     Basic Metabolic Panel: Recent Labs  Lab 02/01/22 0004 02/01/22 0012 02/01/22 0013  NA 139 139 140  K 5.1 5.2* 5.2*  CL 95* 94*  --   CO2 33*  --   --   GLUCOSE 157* 154*  --   BUN 34* 35*  --   CREATININE 0.47* 0.50*  --   CALCIUM 9.2  --   --    GFR: Estimated Creatinine Clearance: 53.1 mL/min (A) (by C-G formula based on SCr of 0.5 mg/dL (L)). Recent Labs  Lab 02/01/22 0004  WBC 26.4*  LATICACIDVEN 3.2*    Liver Function Tests: Recent Labs  Lab 02/01/22 0004  AST 24  ALT 39  ALKPHOS 120  BILITOT 0.4  PROT 7.6  ALBUMIN 2.0*   No results for input(s): "LIPASE", "AMYLASE" in the last 168 hours. No results for input(s): "AMMONIA" in the last 168 hours.  ABG    Component Value Date/Time   PHART 7.564 (H) 01/05/2022 0051   PCO2ART 31.7 (L) 01/05/2022 0051   PO2ART 122 (H) 01/05/2022 0051   HCO3 39.3 (H) 02/01/2022 0013   TCO2 41 (H) 02/01/2022 0013   ACIDBASEDEF 2.0 05/30/2021 0052   O2SAT 41 02/01/2022 0013     Coagulation Profile: No results for input(s): "INR", "PROTIME" in the last 168 hours.  Cardiac Enzymes: No results for input(s): "CKTOTAL", "CKMB", "CKMBINDEX", "TROPONINI" in the last 168 hours.  HbA1C: Hgb A1c MFr Bld  Date/Time Value Ref Range Status  01/04/2022 11:22 PM 5.3 4.8 - 5.6 % Final    Comment:    (NOTE)         Prediabetes: 5.7 - 6.4         Diabetes: >6.4         Glycemic control for adults with diabetes: <7.0   04/11/2021 09:12 PM 5.5 4.8 - 5.6 % Final    Comment:    (NOTE) Pre diabetes:          5.7%-6.4%  Diabetes:              >6.4%  Glycemic control for   <7.0% adults with diabetes     CBG: No results for input(s): "GLUCAP" in the last 168 hours.  Review of Systems:   Chronically critically ill onvent   Past Medical History:  He,  has a past medical history of CHF (congestive heart failure) (HCC), Coronary artery  disease, Essential tremor, GI bleed (2019), Headache, Low blood sugar, Mitral regurgitation, Mitral valve prolapse, Osteopenia (2021), Paroxysmal atrial fibrillation (HCC), and Tremor.   Surgical History:   Past Surgical History:  Procedure Laterality Date   COLONOSCOPY WITH PROPOFOL N/A 02/19/2017   Procedure: COLONOSCOPY WITH PROPOFOL;  Surgeon: Charlott Rakes, MD;  Location: [facility];  Service: Endoscopy;  Laterality: N/A;   IR GASTROSTOMY TUBE MOD SED  10/18/2020   IR IVC FILTER PLMT / S&I /IMG GUID/MOD SED  11/01/2020   laser eye surgery for retina detachment     MITRAL VALVE REPAIR  01/2003  monitor  02/05/2006   polyp removal     TONSILLECTOMY     tooth removal     as a teenager   TRACHEOSTOMY TUBE PLACEMENT N/A 10/26/2020   Procedure: TRACHEOSTOMY;  Surgeon: Drema Halon, MD;  [facility]: [facility];  Service: ENT;  Laterality: N/A;     Social History:   reports that he has never smoked. He has never used smokeless tobacco. He reports current alcohol use. He reports that he does not use drugs.   Family History:  His family history includes Asthma in his daughter; CAD in his mother; Epilepsy in his son; Heart disease in his mother; Heart failure in his father, maternal grandmother, and paternal grandmother; Migraines in his mother; Pneumonia in his maternal grandfather; Skin cancer in his father; Stroke in his paternal grandfather; Tremor in his father; Valvular heart disease in his father. There is no history of Parkinson's disease.   Allergies Allergies  Allergen Reactions   Prednisone Other (See Comments)    Listed as allergy on MAR Unknown reaction   Cortisone Other (See Comments)    Listed as allergy per MAR Unknown reaction     Home Medications  Prior to Admission medications   Medication Sig Start Date End Date Taking? Authorizing Provider  acetaminophen (TYLENOL) 325 MG tablet Take 650 mg by mouth every 8 (eight) hours as needed for mild pain or fever.     [provider]  ALPRAZolam Prudy Feeler) 0.5 MG tablet Place 0.5 mg into feeding tube every 8 (eight) hours as needed for anxiety.    [provider]  esomeprazole (NEXIUM) 40 MG capsule Take 40 mg by mouth daily. Via feeding tube    [provider]  folic acid (FOLVITE) 1 MG tablet Take 1 tablet (1 mg total) by mouth daily. Patient taking differently: Place 1 mg into feeding tube daily. 09/29/20   Azucena Fallen, MD  furosemide (LASIX) 20 MG tablet Place 1 tablet (20 mg total) into feeding tube daily as needed for fluid or edema. Patient taking differently: Place 20 mg into feeding tube daily. 07/03/21   Almon Hercules, MD  hydrOXYzine (ATARAX) 25 MG tablet Place 25 mg into feeding tube every 6 (six) hours as needed for anxiety.    [provider]  Lactobacillus-Inulin (CULTURELLE DIGESTIVE HEALTH) CAPS Give 1 capsule by tube daily. 10 billion    [provider]  lactulose (CHRONULAC) 10 GM/15ML solution Place 20 g into feeding tube every 12 (twelve) hours as needed (constipation).    [provider]  levalbuterol Pauline Aus) 1.25 MG/3ML nebulizer solution Take 1.25 mg by nebulization every 6 (six) hours.    [provider]  levETIRAcetam (KEPPRA) 100 MG/ML solution Place 1,500 mg into feeding tube in the morning and at bedtime.    [provider]  levothyroxine (SYNTHROID) 25 MCG tablet Place 25 mcg into feeding tube daily.    [provider]  LORazepam (ATIVAN) 2 MG/ML injection 2 mg as needed (seizures).    [provider]  midodrine (PROAMATINE) 10 MG tablet Place 1 tablet (10 mg total) into feeding tube 3 (three) times daily with meals. Patient taking differently: Place 10 mg into feeding tube 3 (three) times daily. 05/01/21   Leroy Sea, MD  Multiple Vitamin (MULTIVITAMIN WITH MINERALS) TABS tablet Place 1 tablet into feeding tube daily. 05/11/21   Marguerita Merles Latif, DO  Nutritional Supplements  (FEEDING SUPPLEMENT, JEVITY 1.5 CAL/FIBER,) LIQD Place 1,000 mLs into feeding tube continuous. Patient  not taking: Reported on 08/30/2021 05/10/21   Marguerita Merles Latif, DO  Nutritional Supplements (FEEDING SUPPLEMENT, OSMOLITE 1.5 CAL,) LIQD Place 55 mL/hr into feeding tube See admin instructions. 55 ml/hr for 22-24 hours = 1815-1980 kcal & 1520-1603 ml    [provider]  Omega-3 Fatty Acids (FISH OIL) 1000 MG CAPS Take 1,000 mg by mouth daily.    [provider]  polyethylene glycol (MIRALAX / GLYCOLAX) 17 g packet Place 17 g into feeding tube every other day.    [provider]  Probiotic Product (CULTURELLE PROBIOTICS PO) Give 1 capsule by tube daily.    [provider]  sertraline (ZOLOFT) 25 MG tablet Place 25 mg into feeding tube daily.    [provider]  Soap & Cleansers (CETAPHIL GENTLE CLEANSER EX) Apply 10 mLs topically daily.    [provider]  tamsulosin (FLOMAX) 0.4 MG CAPS capsule Take 0.4 mg by mouth daily. Via feeding tube    [provider]  Water For Irrigation, Sterile (STERILE WATER FOR IRRIGATION) Irrigate with 100 mLs as directed See admin instructions. 100 ml H2O flush every 4 hours    [provider]     Critical care time:   This patient is critically ill with multiple organ system failure; which, requires frequent high complexity decision making, assessment, support, evaluation, and titration of therapies. This was completed through the application of advanced monitoring technologies and extensive interpretation of multiple databases. During this encounter critical care time was devoted to patient care services described in this note for 32 minutes.     Josephine Igo, DO [facility] 02/01/2022 3:57 AM

## 2022-02-01 NOTE — Progress Notes (Signed)
NAME:  Austin Davis, MRN:  [id number], DOB:  [birth year], LOS: 0 ADMISSION DATE:  01/31/2022, CONSULTATION DATE: 02/01/2021 REFERRING MD: Yvonne Kendall pa, CHIEF COMPLAINT: Pneumonia  History of Present Illness:  This is a 82 year old gentleman past medical history of chronic respiratory failure, vent dependence, tracheostomy tube in place, history of seizure, Parkinson disease, A-fib not on AC.  Presents from [facility] with increased respiratory distress requiring vent support, bilateral infiltrates on chest x-ray concerning for pneumonia.  Previous culture results have reviewed revealed VRE and ESBL organisms.  He presents today with an elevated white count concern for sepsis.  Pertinent  Medical History   Past Medical History:  Diagnosis Date   CHF (congestive heart failure) ([place])    Coronary artery disease    Essential tremor    GI bleed 2019   hospitalized at [facility] for one week   Headache    since childhood   Low blood sugar    since childhood, controlled by diet   Mitral regurgitation    Mitral valve prolapse    Osteopenia 2021   Paroxysmal atrial fibrillation ([place])    Tremor      Significant Hospital Events: Including procedures, antibiotic start and stop dates in addition to other pertinent events   1/13 admitted with sepsis, started on mero linezolid   Interim History / Subjective:  Admitted this morning   Objective   Blood pressure 111/65, pulse 98, temperature 98.6 F (37 C), temperature source Oral, resp. rate (!) 24, height 5\' 10"  (1.778 m), weight 51.8 kg, SpO2 99 %.    Vent Mode: PCV FiO2 (%):  [50 %-60 %] 50 % Set Rate:  [20 bmp-24 bmp] 24 bmp PEEP:  [5 cmH20] 5 cmH20 Pressure Support:  [5 cmH20] 5 cmH20 Plateau Pressure:  [25 cmH20-26 cmH20] 25 cmH20   Intake/Output Summary (Last 24 hours) at 02/01/2022 1203 Last data filed at 02/01/2022 0645 Gross per 24 hour  Intake 1198.84 ml  Output 450 ml  Net 748.84 ml   Filed Weights    01/31/22 2103  Weight: 51.8 kg    Examination: General:  Neuro: awake alert following commands HENT: Trach secure. Some secretions around trach. Anicteric sclera Lungs: Rhonchorous. Mechanically ventialted  Cardiovascular: rrr cap refill < 3 sec  Abdomen: soft thin. PEG  Extremities: no acute joint deformity. Symmetrically decreased muscle mass  GU: straw urine   Resolved Hospital Problem list     Assessment & Plan:   Bilateral HCAP/VAP Sepsis secondary to above History of MDR organisms, VRE and ESBL, Enterococcus, Klebsiella and Pseudomonas P -Contact precautions w hx MDR UTIs -linezolid, mero  -Ordered a trach aspirate in addition to pending cx data  -adding a PCT -consider ID consult   Acute on chronic hypoxic respiratory failure Vent/ trach dependence  -routine trach care -on PCV -VAP pulm hygiene   Lactic acidosis due to above -tx underlying infectious process  -recheck is ordered   Chronic hypotension  - continue midodrine   Anemia of chronic dz - no sign of active bleeding   Hx pAF, now sinus -not on [facility] -tele monitoring  Hypothyroidism  - synthroid  Parkinsons disease  Hx seizure disorder Anxiety  Tremors - chronic issues  -Keppra -[place] Xanax, sertraline   Malnutrition -EN via PEG   Best Practice (right click and "Reselect all SmartList Selections" daily)   Diet/type: NPO -- EN ordered  DVT prophylaxis: prophylactic heparin  GI prophylaxis: H2B Lines: N/A Foley:  N/A Code Status:  full  code Last date of multidisciplinary goals of care discussion [pending]  Labs   CBC: Recent Labs  Lab 02/01/22 0004 02/01/22 0012 02/01/22 0013 02/01/22 0535  WBC 26.4*  --   --  22.5*  NEUTROABS 25.3*  --   --   --   HGB 10.4* 11.9* 11.9* 8.5*  HCT 35.5* 35.0* 35.0* 28.3*  MCV 88.8  --   --  87.9  PLT 457*  --   --  370    Basic Metabolic Panel: Recent Labs  Lab 02/01/22 0004 02/01/22 0012 02/01/22 0013 02/01/22 0535  NA 139 139 140  137  K 5.1 5.2* 5.2* 4.2  CL 95* 94*  --  95*  CO2 33*  --   --  34*  GLUCOSE 157* 154*  --  102*  BUN 34* 35*  --  25*  CREATININE 0.47* 0.50*  --  0.42*  CALCIUM 9.2  --   --  8.4*  MG  --   --   --  1.7  PHOS  --   --   --  2.5   GFR: Estimated Creatinine Clearance: 53.1 mL/min (A) (by C-G formula based on SCr of 0.42 mg/dL (L)). Recent Labs  Lab 02/01/22 0004 02/01/22 0535 02/01/22 0701  WBC 26.4* 22.5*  --   LATICACIDVEN 3.2*  --  2.0*    Liver Function Tests: Recent Labs  Lab 02/01/22 0004 02/01/22 0535  AST 24 21  ALT 39 33  ALKPHOS 120 108  BILITOT 0.4 0.3  PROT 7.6 6.3*  ALBUMIN 2.0* 1.6*   No results for input(s): "LIPASE", "AMYLASE" in the last 168 hours. Recent Labs  Lab 02/01/22 0535  AMMONIA 35    ABG    Component Value Date/Time   PHART 7.564 (H) 01/05/2022 0051   PCO2ART 31.7 (L) 01/05/2022 0051   PO2ART 122 (H) 01/05/2022 0051   HCO3 39.3 (H) 02/01/2022 0013   TCO2 41 (H) 02/01/2022 0013   ACIDBASEDEF 2.0 05/30/2021 0052   O2SAT 41 02/01/2022 0013     Coagulation Profile: No results for input(s): "INR", "PROTIME" in the last 168 hours.  Cardiac Enzymes: No results for input(s): "CKTOTAL", "CKMB", "CKMBINDEX", "TROPONINI" in the last 168 hours.  HbA1C: Hgb A1c MFr Bld  Date/Time Value Ref Range Status  01/04/2022 11:22 PM 5.3 4.8 - 5.6 % Final    Comment:    (NOTE)         Prediabetes: 5.7 - 6.4         Diabetes: >6.4         Glycemic control for adults with diabetes: <7.0   04/11/2021 09:12 PM 5.5 4.8 - 5.6 % Final    Comment:    (NOTE) Pre diabetes:          5.7%-6.4%  Diabetes:              >6.4%  Glycemic control for   <7.0% adults with diabetes     CBG: Recent Labs  Lab 02/01/22 0821 02/01/22 1125  GLUCAP 135* 102*   CRITICAL CARE Performed by: Lanier Clam   Total critical care time: 35 minutes  Critical care time was exclusive of separately billable procedures and treating other patients. Critical  care was necessary to treat or prevent imminent or life-threatening deterioration.  Critical care was time spent personally by me on the following activities: development of treatment plan with patient and/or surrogate as well as nursing, discussions with consultants, evaluation of patient's response to treatment,  examination of patient, obtaining history from patient or surrogate, ordering and performing treatments and interventions, ordering and review of laboratory studies, ordering and review of radiographic studies, pulse oximetry and re-evaluation of patient's condition.  Tessie Fass MSN, AGACNP-BC [facility] Pulmonary/Critical Care Medicine Amion for pager  02/01/2022, 12:03 PM

## 2022-02-02 DIAGNOSIS — R569 Unspecified convulsions: Secondary | ICD-10-CM | POA: Diagnosis not present

## 2022-02-02 LAB — GLUCOSE, CAPILLARY
Glucose-Capillary: 117 mg/dL — ABNORMAL HIGH (ref 70–99)
Glucose-Capillary: 133 mg/dL — ABNORMAL HIGH (ref 70–99)
Glucose-Capillary: 160 mg/dL — ABNORMAL HIGH (ref 70–99)
Glucose-Capillary: 165 mg/dL — ABNORMAL HIGH (ref 70–99)
Glucose-Capillary: 167 mg/dL — ABNORMAL HIGH (ref 70–99)
Glucose-Capillary: 193 mg/dL — ABNORMAL HIGH (ref 70–99)

## 2022-02-02 LAB — COMPREHENSIVE METABOLIC PANEL
ALT: 34 U/L (ref 0–44)
AST: 23 U/L (ref 15–41)
Albumin: 1.9 g/dL — ABNORMAL LOW (ref 3.5–5.0)
Alkaline Phosphatase: 126 U/L (ref 38–126)
Anion gap: 8 (ref 5–15)
BUN: 19 mg/dL (ref 8–23)
CO2: 34 mmol/L — ABNORMAL HIGH (ref 22–32)
Calcium: 8.9 mg/dL (ref 8.9–10.3)
Chloride: 95 mmol/L — ABNORMAL LOW (ref 98–111)
Creatinine, Ser: 0.41 mg/dL — ABNORMAL LOW (ref 0.61–1.24)
GFR, Estimated: >60 mL/min (ref >60)
Glucose, Bld: 144 mg/dL — ABNORMAL HIGH (ref 70–99)
Potassium: 3.5 mmol/L (ref 3.5–5.1)
Sodium: 137 mmol/L (ref 135–145)
Total Bilirubin: 0.5 mg/dL (ref 0.3–1.2)
Total Protein: 7.2 g/dL (ref 6.5–8.1)

## 2022-02-02 LAB — MAGNESIUM
Magnesium: 1.7 mg/dL (ref 1.7–2.4)
Magnesium: 2 mg/dL (ref 1.7–2.4)

## 2022-02-02 LAB — PROCALCITONIN: Procalcitonin: 0.19 ng/mL

## 2022-02-02 LAB — CBC
HCT: 31.3 % — ABNORMAL LOW (ref 39.0–52.0)
Hemoglobin: 9.7 g/dL — ABNORMAL LOW (ref 13.0–17.0)
MCH: 26.5 pg (ref 26.0–34.0)
MCHC: 31 g/dL (ref 30.0–36.0)
MCV: 85.5 fL (ref 80.0–100.0)
Platelets: 367 10*3/uL (ref 150–400)
RBC: 3.66 MIL/uL — ABNORMAL LOW (ref 4.22–5.81)
RDW: 20 % — ABNORMAL HIGH (ref 11.5–15.5)
WBC: 25.4 10*3/uL — ABNORMAL HIGH (ref 4.0–10.5)
nRBC: 0 % (ref 0.0–0.2)

## 2022-02-02 LAB — PHOSPHORUS
Phosphorus: 2.3 mg/dL — ABNORMAL LOW (ref 2.5–4.6)
Phosphorus: 1.8 mg/dL — ABNORMAL LOW (ref 2.5–4.6)

## 2022-02-02 MED ORDER — POTASSIUM PHOSPHATES 15 MMOLE/5ML IV SOLN
15.0000 mmol | Freq: Once | INTRAVENOUS | Status: AC
  Administered 2022-02-02: 15 mmol via INTRAVENOUS
  Filled 2022-02-02: qty 5

## 2022-02-02 MED ORDER — MAGNESIUM SULFATE 2 GM/50ML IV SOLN
2.0000 g | Freq: Once | INTRAVENOUS | Status: AC
  Administered 2022-02-02: 2 g via INTRAVENOUS
  Filled 2022-02-02: qty 50

## 2022-02-02 MED ORDER — INSULIN ASPART 100 UNIT/ML IJ SOLN
0.0000 [IU] | INTRAMUSCULAR | Status: DC
  Administered 2022-02-02: 2 [IU] via SUBCUTANEOUS

## 2022-02-02 NOTE — Progress Notes (Signed)
[facility] ADULT ICU REPLACEMENT PROTOCOL   The patient does apply for the [facility] Adult ICU Electrolyte Replacment Protocol based on the criteria listed below:   1.Exclusion criteria: TCTS, ECMO, Dialysis, and Myasthenia Gravis patients 2. Is GFR >/= 30 ml/min? Yes.    Patient's GFR today is >60 3. Is SCr </= 2? Yes.   Patient's SCr is 0.41 mg/dL 4. Did SCr increase >/= 0.5 in 24 hours? No. 5.Pt's weight >40kg  Yes.   6. Abnormal electrolyte(s): Mag 1.9, Phos 2.3 with K+ 3.5  7. Electrolytes replaced per protocol 8.  Call MD STAT for K+ </= 2.5, Phos </= 1, or Mag </= 1 Physician:  Dr. Henry Russel  Lolita Lenz 02/02/2022 2:24 AM

## 2022-02-02 NOTE — Progress Notes (Signed)
eLink Physician-Brief Progress Note Patient Name: KAYLEB WARSHAW DOB: 1940/09/16 MRN: [id number]   Date of Service  02/02/2022  HPI/Events of Note  CBG's 160's    on TF,   asking for SSI HbA1C 5.3 Creatinine 0.42  eICU Interventions  Low dose SSI q 4 ordered to start     Intervention Category Intermediate Interventions: Hyperglycemia - evaluation and treatment  Rosalie Gums Ayriel [place] 02/02/2022, 8:58 PM

## 2022-02-02 NOTE — Progress Notes (Signed)
NAME:  Austin Davis, MRN:  [id number], DOB:  [birth year], LOS: 1 ADMISSION DATE:  01/31/2022, CONSULTATION DATE: 02/01/2021 REFERRING MD: Santa Lighter pa, CHIEF COMPLAINT: Pneumonia  History of Present Illness:  This is a 82 year old gentleman past medical history of chronic respiratory failure, vent dependence, tracheostomy tube in place, history of seizure, Parkinson disease, A-fib not on AC.  Presents from [facility] with increased respiratory distress requiring vent support, bilateral infiltrates on chest x-ray concerning for pneumonia.  Previous culture results have reviewed revealed VRE and ESBL organisms.  He presents today with an elevated white count concern for sepsis.  Pertinent  Medical History   Past Medical History:  Diagnosis Date   CHF (congestive heart failure) ([facility])    Coronary artery disease    Essential tremor    GI bleed 2019   hospitalized at [facility] for one week   Headache    since childhood   Low blood sugar    since childhood, controlled by diet   Mitral regurgitation    Mitral valve prolapse    Osteopenia 2021   Paroxysmal atrial fibrillation ([facility])    Tremor      Significant Hospital Events: Including procedures, antibiotic start and stop dates in addition to other pertinent events   1/13 admitted with sepsis, started on mero linezolid  1/14 trach asp with moderate GNRs and rare yeast   Interim History / Subjective:  PCT 0.39--> 0.19 WBC 22 --> 25  BMP stable  Still tachy but improving   Objective   Blood pressure 116/84, pulse (!) 107, temperature 98.3 F (36.8 C), temperature source Oral, resp. rate (!) 24, height 5\' 10"  (1.778 m), weight 51.6 kg, SpO2 99 %.    Vent Mode: PCV FiO2 (%):  [40 %] 40 % Set Rate:  [24 bmp] 24 bmp PEEP:  [5 cmH20] 5 cmH20 Plateau Pressure:  [20 cmH20-26 cmH20] 20 cmH20   Intake/Output Summary (Last 24 hours) at 02/02/2022 1043 Last data filed at 02/02/2022 0700 Gross per 24 hour  Intake 1711.51 ml   Output 1000 ml  Net 711.51 ml   Filed Weights   01/31/22 2103 02/02/22 0500  Weight: 51.8 kg 51.6 kg    Examination: General: chronically ill NAD trach/vent  Neuro: awake following commands, tremor  HENT: trach secure anicteric sclera  Lungs: mechanically ventialted  Cardiovascular: tachy cap refill brisk  Abdomen: soft thin  Extremities: no edema, decr muscl mass symmetrically  GU: straw urine, external cath   Resolved Hospital Problem list   Lactic acidosis   Assessment & Plan:   Bilat HCAP/VAP Sepsis due to above Hx MDRO, VRE, ESBL, enterococcus, klebsiella, pseudomonas  -trach aspirate this admission with moderate GNRs and rare yeast so far  P -Contact precautions w hx MDROs -linezolid, mero  -follow cx data  -PRN CXR  -follow fever curve, WBC   Acute on chronic hypoxic respiratory failure - improving  Vent / trach dependence  -routine trach care -on PCV -- FiO2 weaned to 40%  -VAP pulm hygiene   Chronic hypotension  - cont midodrine   Anemia of chronic dz - following H/H on CBC   Hx pAF -not on AC -tele monitoring -optimize lytes   Hypothyroidism  - synthroid  Parkinsons disease  Hx seizure disorder Anxiety  Tremors -Keppra -[facility] Xanax, sertraline   Malnutrition -EN via PEG   Best Practice (right click and "Reselect all SmartList Selections" daily)   Diet/type: NPO -- EN ordered  DVT prophylaxis: prophylactic heparin  GI prophylaxis: H2B Lines: N/A Foley:  N/A Code Status:  full code Last date of multidisciplinary goals of care discussion [pending]  Labs   CBC: Recent Labs  Lab 02/01/22 0004 02/01/22 0012 02/01/22 0013 02/01/22 0535 02/02/22 0048  WBC 26.4*  --   --  22.5* 25.4*  NEUTROABS 25.3*  --   --   --   --   HGB 10.4* 11.9* 11.9* 8.5* 9.7*  HCT 35.5* 35.0* 35.0* 28.3* 31.3*  MCV 88.8  --   --  87.9 85.5  PLT 457*  --   --  370 367    Basic Metabolic Panel: Recent Labs  Lab 02/01/22 0004 02/01/22 0012  02/01/22 0013 02/01/22 0535 02/02/22 0048  NA 139 139 140 137 137  K 5.1 5.2* 5.2* 4.2 3.5  CL 95* 94*  --  95* 95*  CO2 33*  --   --  34* 34*  GLUCOSE 157* 154*  --  102* 144*  BUN 34* 35*  --  25* 19  CREATININE 0.47* 0.50*  --  0.42* 0.41*  CALCIUM 9.2  --   --  8.4* 8.9  MG  --   --   --  1.7 1.7  PHOS  --   --   --  2.5 2.3*   GFR: Estimated Creatinine Clearance: 52.9 mL/min (A) (by C-G formula based on SCr of 0.41 mg/dL (L)). Recent Labs  Lab 02/01/22 0004 02/01/22 0535 02/01/22 0701 02/01/22 1318 02/02/22 0048  PROCALCITON  --   --   --  0.39 0.19  WBC 26.4* 22.5*  --   --  25.4*  LATICACIDVEN 3.2*  --  2.0* 1.5  --     Liver Function Tests: Recent Labs  Lab 02/01/22 0004 02/01/22 0535 02/02/22 0048  AST 24 21 23   ALT 39 33 34  ALKPHOS 120 108 126  BILITOT 0.4 0.3 0.5  PROT 7.6 6.3* 7.2  ALBUMIN 2.0* 1.6* 1.9*   No results for input(s): "LIPASE", "AMYLASE" in the last 168 hours. Recent Labs  Lab 02/01/22 0535  AMMONIA 35    ABG    Component Value Date/Time   PHART 7.564 (H) 01/05/2022 0051   PCO2ART 31.7 (L) 01/05/2022 0051   PO2ART 122 (H) 01/05/2022 0051   HCO3 39.3 (H) 02/01/2022 0013   TCO2 41 (H) 02/01/2022 0013   ACIDBASEDEF 2.0 05/30/2021 0052   O2SAT 41 02/01/2022 0013     Coagulation Profile: No results for input(s): "INR", "PROTIME" in the last 168 hours.  Cardiac Enzymes: No results for input(s): "CKTOTAL", "CKMB", "CKMBINDEX", "TROPONINI" in the last 168 hours.  HbA1C: Hgb A1c MFr Bld  Date/Time Value Ref Range Status  01/04/2022 11:22 PM 5.3 4.8 - 5.6 % Final    Comment:    (NOTE)         Prediabetes: 5.7 - 6.4         Diabetes: >6.4         Glycemic control for adults with diabetes: <7.0   04/11/2021 09:12 PM 5.5 4.8 - 5.6 % Final    Comment:    (NOTE) Pre diabetes:          5.7%-6.4%  Diabetes:              >6.4%  Glycemic control for   <7.0% adults with diabetes     CBG: Recent Labs  Lab 02/01/22 1125  02/01/22 1656 02/01/22 1928 02/02/22 0548 02/02/22 0753  GLUCAP 102* 90 127* 167* 193*  CRITICAL CARE Performed by: Lanier Clam   Total critical care time: 36 minutes  Critical care time was exclusive of separately billable procedures and treating other patients. Critical care was necessary to treat or prevent imminent or life-threatening deterioration.  Critical care was time spent personally by me on the following activities: development of treatment plan with patient and/or surrogate as well as nursing, discussions with consultants, evaluation of patient's response to treatment, examination of patient, obtaining history from patient or surrogate, ordering and performing treatments and interventions, ordering and review of laboratory studies, ordering and review of radiographic studies, pulse oximetry and re-evaluation of patient's condition.  Tessie Fass MSN, AGACNP-BC [facility] Pulmonary/Critical Care Medicine Amion for pager  02/02/2022, 10:43 AM

## 2022-02-02 NOTE — Consult Note (Signed)
WOC Nurse Consult Note: Reason for Consult:Patient with stage 2 PI present on admission Wound type:Pressure plus shear Pressure Injury POA: Yes Measurement:to be measured today with next dressing change by Bedside RN and documented on Nursing Flow Sheet Wound ZYT:MMIT, moist Drainage (amount, consistency, odor) none Periwound:intact Dressing procedure/placement/frequency:I have provided guidance for nursing to cleanse and cover the wound with a silicone foam per our house protocol. This is to be changed every other day. The patient will be turned and repositioned from side to side and time in the supine position minimized. Heels are to be floated.  WOC nursing team will not follow, but will remain available to this patient, the nursing and medical teams.  Please re-consult if needed.  Thank you for inviting [place] to participate in this patient's Plan of Care.  Ladona Mow, MSN, RN, CNS, GNP, Leda Min, [employer], [employer] phone:  [phone]

## 2022-02-03 DIAGNOSIS — Z930 Tracheostomy status: Secondary | ICD-10-CM | POA: Diagnosis not present

## 2022-02-03 DIAGNOSIS — J189 Pneumonia, unspecified organism: Secondary | ICD-10-CM | POA: Diagnosis not present

## 2022-02-03 DIAGNOSIS — R569 Unspecified convulsions: Secondary | ICD-10-CM | POA: Diagnosis not present

## 2022-02-03 LAB — BASIC METABOLIC PANEL
Anion gap: 7 (ref 5–15)
BUN: 17 mg/dL (ref 8–23)
CO2: 37 mmol/L — ABNORMAL HIGH (ref 22–32)
Calcium: 8.2 mg/dL — ABNORMAL LOW (ref 8.9–10.3)
Chloride: 93 mmol/L — ABNORMAL LOW (ref 98–111)
Creatinine, Ser: 0.43 mg/dL — ABNORMAL LOW (ref 0.61–1.24)
GFR, Estimated: >60 mL/min (ref >60)
Glucose, Bld: 166 mg/dL — ABNORMAL HIGH (ref 70–99)
Potassium: 4.3 mmol/L (ref 3.5–5.1)
Sodium: 137 mmol/L (ref 135–145)

## 2022-02-03 LAB — MAGNESIUM
Magnesium: 1.9 mg/dL (ref 1.7–2.4)
Magnesium: 2.2 mg/dL (ref 1.7–2.4)

## 2022-02-03 LAB — CBC
HCT: 27.7 % — ABNORMAL LOW (ref 39.0–52.0)
Hemoglobin: 8.5 g/dL — ABNORMAL LOW (ref 13.0–17.0)
MCH: 26.1 pg (ref 26.0–34.0)
MCHC: 30.7 g/dL (ref 30.0–36.0)
MCV: 85 fL (ref 80.0–100.0)
Platelets: 368 10*3/uL (ref 150–400)
RBC: 3.26 MIL/uL — ABNORMAL LOW (ref 4.22–5.81)
RDW: 20 % — ABNORMAL HIGH (ref 11.5–15.5)
WBC: 17.7 10*3/uL — ABNORMAL HIGH (ref 4.0–10.5)
nRBC: 0 % (ref 0.0–0.2)

## 2022-02-03 LAB — GLUCOSE, CAPILLARY
Glucose-Capillary: 132 mg/dL — ABNORMAL HIGH (ref 70–99)
Glucose-Capillary: 112 mg/dL — ABNORMAL HIGH (ref 70–99)
Glucose-Capillary: 123 mg/dL — ABNORMAL HIGH (ref 70–99)
Glucose-Capillary: 117 mg/dL — ABNORMAL HIGH (ref 70–99)

## 2022-02-03 LAB — PHOSPHORUS
Phosphorus: 1.5 mg/dL — ABNORMAL LOW (ref 2.5–4.6)
Phosphorus: 2.6 mg/dL (ref 2.5–4.6)

## 2022-02-03 LAB — PROCALCITONIN: Procalcitonin: 0.21 ng/mL

## 2022-02-03 MED ORDER — INSULIN ASPART 100 UNIT/ML IJ SOLN
0.0000 [IU] | INTRAMUSCULAR | Status: DC
  Administered 2022-02-03 – 2022-02-06 (×13): 1 [IU] via SUBCUTANEOUS
  Administered 2022-02-06: 2 [IU] via SUBCUTANEOUS
  Administered 2022-02-07 (×2): 1 [IU] via SUBCUTANEOUS

## 2022-02-03 MED ORDER — SODIUM CHLORIDE 0.9 % IV SOLN: INTRAVENOUS | Status: DC | PRN

## 2022-02-03 MED ORDER — SODIUM PHOSPHATES 45 MMOLE/15ML IV SOLN
45.0000 mmol | Freq: Once | INTRAVENOUS | Status: AC
  Administered 2022-02-03: 45 mmol via INTRAVENOUS
  Filled 2022-02-03: qty 15

## 2022-02-03 MED ORDER — VALPROIC ACID 250 MG/5ML PO SOLN
250.0000 mg | Freq: Two times a day (BID) | ORAL | Status: DC
  Administered 2022-02-03 – 2022-02-07 (×8): 250 mg
  Filled 2022-02-03 (×7): qty 5

## 2022-02-03 MED ORDER — LEVETIRACETAM 250 MG PO TABS
1000.0000 mg | ORAL_TABLET | Freq: Two times a day (BID) | ORAL | Status: DC
  Administered 2022-02-03 – 2022-02-07 (×9): 1000 mg
  Filled 2022-02-03 (×9): qty 4

## 2022-02-03 MED ORDER — VALPROIC ACID 250 MG/5ML PO SOLN
250.0000 mg | Freq: Two times a day (BID) | ORAL | Status: DC
  Administered 2022-02-03: 250 mg via ORAL
  Filled 2022-02-03 (×2): qty 5

## 2022-02-03 MED ORDER — POTASSIUM CHLORIDE 20 MEQ PO PACK
40.0000 meq | PACK | Freq: Once | ORAL | Status: AC
  Administered 2022-02-03: 40 meq
  Filled 2022-02-03: qty 2

## 2022-02-03 MED ORDER — MAGNESIUM SULFATE 2 GM/50ML IV SOLN
2.0000 g | Freq: Once | INTRAVENOUS | Status: AC
  Administered 2022-02-03: 2 g via INTRAVENOUS
  Filled 2022-02-03: qty 50

## 2022-02-03 NOTE — Progress Notes (Signed)
NAME:  Austin Davis, MRN:  [id number], DOB:  Oct 15, 1940, LOS: 2 ADMISSION DATE:  01/31/2022, CONSULTATION DATE: 02/01/2021 REFERRING MD: Yvonne Kendall pa, CHIEF COMPLAINT: Pneumonia  History of Present Illness:  This is a 82 year old gentleman past medical history of chronic respiratory failure, vent dependence, tracheostomy tube in place, history of seizure, Parkinson disease, A-fib not on AC.  Presents from [facility] with increased respiratory distress requiring vent support, bilateral infiltrates on chest x-ray concerning for pneumonia.  Previous culture results have reviewed revealed VRE and ESBL organisms.  He presents today with an elevated white count concern for sepsis.  Pertinent  Medical History   Past Medical History:  Diagnosis Date   CHF (congestive heart failure) ([place])    Coronary artery disease    Essential tremor    GI bleed 2019   hospitalized at [facility] for one week   Headache    since childhood   Low blood sugar    since childhood, controlled by diet   Mitral regurgitation    Mitral valve prolapse    Osteopenia 2021   Paroxysmal atrial fibrillation ([place])    Tremor      Significant Hospital Events: Including procedures, antibiotic start and stop dates in addition to other pertinent events   1/13 admitted with sepsis, started on mero linezolid  1/14 trach asp with moderate GNRs and rare yeast   Interim History / Subjective:  Patient remained afebrile White count started trending down Continue to have copious amount of tenacious secretions via trach  Objective   Blood pressure 111/80, pulse 96, temperature (!) 97 F (36.1 C), temperature source Axillary, resp. rate (!) 24, height 5\' 10"  (1.778 m), weight 51.6 kg, SpO2 100 %.    Vent Mode: PCV FiO2 (%):  [40 %] 40 % Set Rate:  [24 bmp] 24 bmp PEEP:  [5 cmH20] 5 cmH20 Plateau Pressure:  [20 cmH20-26 cmH20] 26 cmH20   Intake/Output Summary (Last 24 hours) at 02/03/2022 0834 Last data filed at  02/03/2022 0800 Gross per 24 hour  Intake 2089.49 ml  Output 1000 ml  Net 1089.49 ml   Filed Weights   01/31/22 2103 02/02/22 0500  Weight: 51.8 kg 51.6 kg    Examination: Physical exam: General: Acute on chronically ill-appearing male, lying on the bed, s/p trach HEENT: [place]/AT, eyes anicteric.  moist mucus membranes Neuro: Opens eyes with vocal stimuli, intermittently following simple commands Chest: Coarse breath sounds, no wheezes or rhonchi Heart: Regular rate and rhythm, no murmurs or gallops Abdomen: Soft, nontender, nondistended, bowel sounds present Skin: No rash  Sodium 137 Potassium 4.3 BUN 17 Creatinine 0.43 Serum phosphorus 1.5/serum magnesium 1.9 WBC 17.7 H/H 8.5/27.7  Resolved Hospital Problem list   Lactic acidosis   Assessment & Plan:  Sepsis due to bilateral multifocal pneumonia with gram-negative rods Acute UTI with Proteus Hx MDRO, VRE, ESBL, enterococcus, klebsiella, pseudomonas  Sepsis indices are improving Respiratory culture is growing gram-negative rods, sensitivities are pending Continue contact isolation considering history of ESBL/VRE MRSA screen is negative and culture showing no signs of gram-positive cocci Will stop linezolid Continue meropenem White count is trending down  Acute on chronic hypoxic respiratory failure Vent / trach dependence  Continue routine trach care Remained stable on FiO2 40% and PEEP of 5 VAP prevention bundle in place  Chronic hypotension  Continue midodrine  Anemia of chronic dz H&H remained stable Monitor and transfuse if less than 7  Paroxysmal A-fib Chronic HFrEF Remain in sinus rhythm, heart rate is  controlled Monitor intake and output Not on anticoagulation due to prior GI bleeding  Hypothyroidism  Continue Synthroid  Parkinsons disease  Seizure disorder Anxiety disorder Continue Keppra and will Not on treatment for Parkinson's disease Continue alprazolam and  sertraline  Malnutrition Continue tube feeds  Best Practice (right click and "Reselect all SmartList Selections" daily)   Diet/type: NPO tube feeds DVT prophylaxis: Subcu Lovenox GI prophylaxis: H2B Lines: N/A Foley:  N/A Code Status:  full code Last date of multidisciplinary goals of care discussion [pending]  Labs   CBC: Recent Labs  Lab 02/01/22 0004 02/01/22 0012 02/01/22 0013 02/01/22 0535 02/02/22 0048 02/03/22 0611  WBC 26.4*  --   --  22.5* 25.4* 17.7*  NEUTROABS 25.3*  --   --   --   --   --   HGB 10.4* 11.9* 11.9* 8.5* 9.7* 8.5*  HCT 35.5* 35.0* 35.0* 28.3* 31.3* 27.7*  MCV 88.8  --   --  87.9 85.5 85.0  PLT 457*  --   --  370 367 161    Basic Metabolic Panel: Recent Labs  Lab 02/01/22 0004 02/01/22 0012 02/01/22 0013 02/01/22 0535 02/02/22 0048 02/02/22 1641 02/03/22 0611  NA 139 139 140 137 137  --  137  K 5.1 5.2* 5.2* 4.2 3.5  --  4.3  CL 95* 94*  --  95* 95*  --  93*  CO2 33*  --   --  34* 34*  --  37*  GLUCOSE 157* 154*  --  102* 144*  --  166*  BUN 34* 35*  --  25* 19  --  17  CREATININE 0.47* 0.50*  --  0.42* 0.41*  --  0.43*  CALCIUM 9.2  --   --  8.4* 8.9  --  8.2*  MG  --   --   --  1.7 1.7 2.0 1.9  PHOS  --   --   --  2.5 2.3* 1.8* 1.5*   GFR: Estimated Creatinine Clearance: 52.9 mL/min (A) (by C-G formula based on SCr of 0.43 mg/dL (L)). Recent Labs  Lab 02/01/22 0004 02/01/22 0535 02/01/22 0701 02/01/22 1318 02/02/22 0048 02/03/22 0611  PROCALCITON  --   --   --  0.39 0.19  --   WBC 26.4* 22.5*  --   --  25.4* 17.7*  LATICACIDVEN 3.2*  --  2.0* 1.5  --   --     Liver Function Tests: Recent Labs  Lab 02/01/22 0004 02/01/22 0535 02/02/22 0048  AST 24 21 23   ALT 39 33 34  ALKPHOS 120 108 126  BILITOT 0.4 0.3 0.5  PROT 7.6 6.3* 7.2  ALBUMIN 2.0* 1.6* 1.9*   No results for input(s): "LIPASE", "AMYLASE" in the last 168 hours. Recent Labs  Lab 02/01/22 0535  AMMONIA 35    ABG    Component Value Date/Time    PHART 7.564 (H) 01/05/2022 0051   PCO2ART 31.7 (L) 01/05/2022 0051   PO2ART 122 (H) 01/05/2022 0051   HCO3 39.3 (H) 02/01/2022 0013   TCO2 41 (H) 02/01/2022 0013   ACIDBASEDEF 2.0 05/30/2021 0052   O2SAT 41 02/01/2022 0013     Coagulation Profile: No results for input(s): "INR", "PROTIME" in the last 168 hours.  Cardiac Enzymes: No results for input(s): "CKTOTAL", "CKMB", "CKMBINDEX", "TROPONINI" in the last 168 hours.  HbA1C: Hgb A1c MFr Bld  Date/Time Value Ref Range Status  01/04/2022 11:22 PM 5.3 4.8 - 5.6 % Final    Comment:    (  NOTE)         Prediabetes: 5.7 - 6.4         Diabetes: >6.4         Glycemic control for adults with diabetes: <7.0   04/11/2021 09:12 PM 5.5 4.8 - 5.6 % Final    Comment:    (NOTE) Pre diabetes:          5.7%-6.4%  Diabetes:              >6.4%  Glycemic control for   <7.0% adults with diabetes     CBG: Recent Labs  Lab 02/02/22 1211 02/02/22 1619 02/02/22 1940 02/02/22 2351 02/03/22 0800  GLUCAP 133* 160* 165* 117* 132*   This patient is critically ill with multiple organ system failure which requires frequent high complexity decision making, assessment, support, evaluation, and titration of therapies. This was completed through the application of advanced monitoring technologies and extensive interpretation of multiple databases.  During this encounter critical care time was devoted to patient care services described in this note for 36 minutes.    Cheri Fowler, MD [place] [facility] Critical [facility] See Amion for pager If no response to pager, please call [phone] until 7pm After 7pm, Please call E-link [phone]

## 2022-02-03 NOTE — TOC Initial Note (Addendum)
Transition of Care [facility]) - Initial/Assessment Note    Patient Details  Name: Austin Davis MRN: [id number] Date of Birth: 01/23/1940  Transition of Care [facility]) CM/SW Contact:    Delilah Shan, LCSWA Phone Number: 02/03/2022, 1:58 PM  Clinical Narrative:                  CSW spoke with patients daughter Austin Davis who informed CSW that patient came from [facility]. Austin Davis confirmed plan is for patient to return to [facility] when medically ready. CSW spoke with Angie with [facility] who also confirmed patient is from [facility] and can return back when medically ready for dc. Angie informed CSW no FL2 needed for patient to return.CSW will continue to follow and assist with patients dc planning needs.  Expected Discharge Plan: Skilled Nursing Facility ([facility]) Barriers to Discharge: Continued Medical Work up   Patient Goals and CMS Choice   CMS Medicare.gov Compare Post Acute Care list provided to:: Patient Represenative (must comment) (Patients daughter) Choice offered to / list presented to : Adult Children (Patients daughter Austin Davis)      Expected Discharge Plan and Services In-house Referral: Clinical Social Work     Living arrangements for the past 2 months: Skilled Holiday representative (From [facility])                                      Prior Living Arrangements/Services Living arrangements for the past 2 months: Skilled Holiday representative (From [facility]) Lives with:: Investment banker, operational (From [facility]) Patient language and need for interpreter reviewed:: Yes        Need for Family Participation in Patient Care: Yes (Comment) Care giver support system in place?: Yes (comment)   Criminal Activity/Legal Involvement Pertinent to Current Situation/Hospitalization: No - Comment as needed  Activities of Daily Living      Permission Sought/Granted Permission sought to share information with : Case Manager, Family Supports, Optician, dispensing Permission granted to share information with : No  Share Information with NAME: Due to patients current orientation CSW spoke with patients daughter Austin Davis  Permission granted to share info w AGENCY: [facility]  Permission granted to share info w Relationship: daughter  Permission granted to share info w Contact Information: Austin Davis [phone]  Emotional Assessment       Orientation: :  (intubated/trach) Alcohol / Substance Use: Not Applicable Psych Involvement: No (comment)  Admission diagnosis:  Seizure (HCC) [R56.9] Pneumonia of both lungs due to infectious organism, unspecified part of lung [J18.9] Patient Active Problem List   Diagnosis Date Noted   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   HCAP (healthcare-associated pneumonia) 02/01/2022   Acute respiratory failure with hypoxia (HCC) 01/05/2022   Hyperkalemia 01/04/2022   Encephalopathy acute 01/04/2022   Seizure (HCC) 01/04/2022   Acute on chronic respiratory failure (HCC) 10/26/2021   Seizures (HCC) 06/26/2021   Tremor    Sepsis (HCC) 06/25/2021   Cardiac arrest due to respiratory disorder (HCC) 06/07/2021   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   Impaired quality of life 05/30/2021   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 05/15/2021   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HCC) 05/08/2021   Pneumonia 05/08/2021   Urinary tract infection associated with indwelling urethral catheter (HCC) 05/08/2021   Encephalopathy 05/08/2021   Pressure injury of skin 04/30/2021   Seizure disorder (HCC) 04/29/2021  Pneumothorax 04/11/2021   Ventilator dependent (HCC)    Aspiration pneumonia (HCC) 03/25/2021   Parkinsonism 03/25/2021   Obstructive sleep apnea 10/24/2020   Chronic obstructive pulmonary disease (HCC) 10/24/2020   Chronic heart failure with preserved ejection fraction (HCC) 10/24/2020   Unintentional weight loss 09/20/2020   SVT  (supraventricular tachycardia) 09/20/2020   Protein-calorie malnutrition, severe 09/17/2020   Acute on chronic respiratory failure with hypoxia and hypercapnia (HCC) 09/16/2020   Failure to thrive in adult 09/16/2020   Stage 1 skin ulcer of sacral region (HCC) 09/16/2020   Chronic hypotension 06/30/2020   History of GI bleed 02/17/2017   Rectal bleeding 02/16/2017   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 [address] [place] [address] Phone: [phone] Fax: [phone]     Social Determinants of Health (SDOH) Social History: SDOH Screenings   Alcohol Screen: Low Risk  (04/12/2021)  Tobacco Use: Low Risk  (01/31/2022)   SDOH Interventions:     Readmission Risk Interventions    06/26/2021   11:10 AM  Readmission Risk Prevention Plan  Transportation Screening Complete  Medication Review (RN Care Manager) Complete  PCP or Specialist appointment within 3-5 days of discharge Complete  HRI or Home Care Consult Complete  SW Recovery Care/Counseling Consult Complete  Palliative Care Screening Not Applicable  Skilled Nursing Facility Complete

## 2022-02-04 DIAGNOSIS — J189 Pneumonia, unspecified organism: Secondary | ICD-10-CM | POA: Diagnosis not present

## 2022-02-04 DIAGNOSIS — R569 Unspecified convulsions: Secondary | ICD-10-CM | POA: Diagnosis not present

## 2022-02-04 DIAGNOSIS — A419 Sepsis, unspecified organism: Secondary | ICD-10-CM | POA: Diagnosis not present

## 2022-02-04 DIAGNOSIS — Z1624 Resistance to multiple antibiotics: Secondary | ICD-10-CM

## 2022-02-04 DIAGNOSIS — Z930 Tracheostomy status: Secondary | ICD-10-CM | POA: Diagnosis not present

## 2022-02-04 LAB — PHOSPHORUS
Phosphorus: 2.5 mg/dL (ref 2.5–4.6)
Phosphorus: 2.3 mg/dL — ABNORMAL LOW (ref 2.5–4.6)

## 2022-02-04 LAB — GLUCOSE, CAPILLARY
Glucose-Capillary: 110 mg/dL — ABNORMAL HIGH (ref 70–99)
Glucose-Capillary: 114 mg/dL — ABNORMAL HIGH (ref 70–99)
Glucose-Capillary: 122 mg/dL — ABNORMAL HIGH (ref 70–99)
Glucose-Capillary: 86 mg/dL (ref 70–99)
Glucose-Capillary: 89 mg/dL (ref 70–99)
Glucose-Capillary: 90 mg/dL (ref 70–99)

## 2022-02-04 LAB — BASIC METABOLIC PANEL
Anion gap: 11 (ref 5–15)
BUN: 27 mg/dL — ABNORMAL HIGH (ref 8–23)
CO2: 26 mmol/L (ref 22–32)
Calcium: 8.4 mg/dL — ABNORMAL LOW (ref 8.9–10.3)
Chloride: 100 mmol/L (ref 98–111)
Creatinine, Ser: 0.36 mg/dL — ABNORMAL LOW (ref 0.61–1.24)
GFR, Estimated: >60 mL/min (ref >60)
Glucose, Bld: 96 mg/dL (ref 70–99)
Potassium: 4.2 mmol/L (ref 3.5–5.1)
Sodium: 137 mmol/L (ref 135–145)

## 2022-02-04 LAB — URINE CULTURE: Culture: 70000 — AB

## 2022-02-04 MED ORDER — SODIUM CHLORIDE 0.9 % IV SOLN
2.0000 g | Freq: Two times a day (BID) | INTRAVENOUS | Status: DC
  Filled 2022-02-04: qty 40

## 2022-02-04 MED ORDER — SODIUM CHLORIDE 0.9 % IV SOLN
1.0000 g | Freq: Three times a day (TID) | INTRAVENOUS | Status: DC
  Administered 2022-02-04 – 2022-02-07 (×9): 1 g via INTRAVENOUS
  Filled 2022-02-04 (×9): qty 20

## 2022-02-04 MED ORDER — OSMOLITE 1.5 CAL PO LIQD
1000.0000 mL | ORAL | Status: DC
  Administered 2022-02-04 – 2022-02-07 (×4): 1000 mL
  Filled 2022-02-04 (×2): qty 1000

## 2022-02-04 NOTE — Consult Note (Signed)
[facility]    Date of Admission:  01/31/2022     Reason for Consult: mdro presence, sepsis    Referring Provider: Merrily Pew    Lines:  trach Peripheral iv External foley  Abx: 1/13-c meropenem       1/12 piptazo 1/12-13 vanc->linezolid  Assessment: 82 yo male, [facility] resident, chronic respiratory failure trach dependent/vent dependent (baseline 40 fio2/5peep), s/p peg tube, combined chf, hx seizure, advance parkinson disease, colonized with mdro (see below micro), afib not on anticoagulation, admitted 02/01/22 for fever and increased respiratory distress   Source of sepsis is unclear at this time but suspect HAP >> UTI. Cxr nonspecific basilar opacity. He is at baseline o2 requirement. Blood cx ngtd. Urine culture (always some bacteria) and unable to communicate sx  He is on meropenem. Day 3 today still have fever but leukocytosis had improved  He is not on any med to induce serotonin syndrome/nms. And his skin exam showed no rash and no aki/transaminitis to suggest other drug induced fever syndromes  Micro: 1/13 blood cx negative 1/13 Respiratory cx -- moderate PsA, moderate enterobacter aerogenes 1/13 urine cx -- 70k proteus mirabilis (non-esbl); 80k kleb pna (esbl; R cipro, bactrim; S imipenem) 1/13 mrsa nares cx negative   Prior pseudomonas (most recently 12/2021) has been sensitive to ceftaz, imipenem, cipro, piptazo) Other bugs present on prior cultures (urine/respiratory) -- acinetobacter baumannii (05/2021; R cipro, bactrim; S amp/sulb, imipenem), enterobacter aerogenes (06/2021; esbl; R cipro/bactrim; S imipenem); VRE (e faecalis -- S amp; R vanc)  Plan:  Very poor prognosis overall but family wants full code Will give him 2 more days (5 days total for fever to resolve) Unclear syndrome (but suspect HAP >> uti) and plan 7-10 day abx tx Continue meropenem (organisms in the past so far all sensitive to imipenem). We  don't see any acinetobacter at this time If he decompensate will consider changing antibiotics to omadocycline and zerbaxa We have asked labs to send for abx testing against cefideracol, avycaz, zerbaxa, recarbio, omadocycline for some of the organisms we see in the past (pseudomonas, kleb) Discussed with dr Merrily Pew   I spent 75 minute reviewing data/chart, and coordinating care and >50% direct face to face time providing counseling/discussing diagnostics/treatment plan with patient   ------------------------------------------------ Principal Problem:   Seizure (HCC) Active Problems:   Tracheostomy dependence (HCC)   HCAP (healthcare-associated pneumonia)    HPI: Austin Davis is a 82 y.o. male [facility] resident, chronic respiratory failure trach dependent/vent dependent (baseline 40 fio2/5peep), combined chf, hx seizure, advance parkinson disease, colonized with mdro (see below micro), afib not on anticoagulation, admitted 02/01/22 for fever and increased respiratory distress    Patient is nonverbal. Hx via dr Diannia Ruder signout and discussion with nursing staff  Patient came in after a few days of respiratory distress and fever  W/u here so far suggest potentially pna. Trach aspirate had grown enterobacter and pseudomonas. Urine culture had grown less than 100k colonies and are polymicrobial  Bcx ngtd  Still fevering on 3 days meropenem Wbc down Hds  Only has stage 2 small sacral decub otherwise no rash  Reviewed his med list pta no ssri/nms agent  Family History  Problem Relation Age of Onset   Heart disease Mother    CAD Mother    Migraines Mother    Heart failure Father    Skin cancer Father    Valvular heart disease Father  prolapsed valve   Tremor Father        "probably had a bit of tremor in his old age"   Heart failure Maternal Grandmother    Pneumonia Maternal Grandfather    Heart failure Paternal Grandmother    Stroke Paternal  Grandfather    Asthma Daughter    Epilepsy Son    Parkinson's disease Neg Hx     Social History   Tobacco Use   Smoking status: Never   Smokeless tobacco: Never   Tobacco comments:    only tried a few cigarettes when he was younger  Substance Use Topics   Alcohol use: Yes    Comment: "probably a 6 pack of beer a year"   Drug use: No    Allergies  Allergen Reactions   Prednisone Other (See Comments)    Listed as allergy on MAR Unknown reaction   Cortisone Other (See Comments)    Listed as allergy per MAR Unknown reaction    Review of Systems: ROS All Other ROS was negative, except mentioned above   Past Medical History:  Diagnosis Date   CHF (congestive heart failure) (HCC)    Coronary artery disease    Essential tremor    GI bleed 2019   hospitalized at [facility] for one week   Headache    since childhood   Low blood sugar    since childhood, controlled by diet   Mitral regurgitation    Mitral valve prolapse    Osteopenia 2021   Paroxysmal atrial fibrillation (HCC)    Tremor        Scheduled Meds:  ALPRAZolam  0.5 mg Per Tube TID   Chlorhexidine Gluconate Cloth  6 each Topical Daily   enoxaparin (LOVENOX) injection  40 mg Subcutaneous Q24H   famotidine  20 mg Per Tube BID   feeding supplement (PROSource TF20)  60 mL Per Tube Daily   folic acid  1 mg Per Tube Daily   insulin aspart  0-9 Units Subcutaneous Q4H   levETIRAcetam  1,000 mg Per Tube BID   levothyroxine  25 mcg Per Tube Daily   midodrine  10 mg Per Tube TID WC   nutrition supplement (JUVEN)  1 packet Per Tube BID BM   mouth rinse  15 mL Mouth Rinse Q2H   sertraline  25 mg Per Tube Daily   valproic acid  250 mg Per Tube BID   Continuous Infusions:  sodium chloride 10 mL/hr at 02/04/22 0300   feeding supplement (OSMOLITE 1.5 CAL) 50 mL/hr at 02/04/22 0300   meropenem (MERREM) IV     PRN Meds:.sodium chloride, acetaminophen, docusate, levalbuterol, LORazepam, mouth rinse,  polyethylene glycol   OBJECTIVE: Blood pressure 97/75, pulse 68, temperature 97.7 F (36.5 C), temperature source Axillary, resp. rate (!) 24, height 5\' 10"  (1.778 m), weight 44.6 kg, SpO2 98 %.  Physical Exam  General/constitutional: chronically ill appearing; trached (40/5); eyes open, minimal interaction in terms of tracking or peripheral ext movement HEENT: Normocephalic, PER, Conj Clear Neck supple -- trach site no purulence/cellulitis change CV: rrr no mrg Lungs: clear to auscultation, normal respiratory effort on vent Abd: Soft, Nontender -- peg tube site insertion no cellutis/discharge Ext: no edema Skin: No Rash -- per nursing small stage 2 sacral decub present on admission Neuro: functional quad with stiff extremities; no tremors MSK: no peripheral joint swelling/tenderness/warmth  Lab Results Lab Results  Component Value Date   WBC 17.7 (H) 02/03/2022   HGB 8.5 (L) 02/03/2022  HCT 27.7 (L) 02/03/2022   MCV 85.0 02/03/2022   PLT 368 02/03/2022    Lab Results  Component Value Date   CREATININE 0.36 (L) 02/04/2022   BUN 27 (H) 02/04/2022   NA 137 02/04/2022   K 4.2 02/04/2022   CL 100 02/04/2022   CO2 26 02/04/2022    Lab Results  Component Value Date   ALT 34 02/02/2022   AST 23 02/02/2022   ALKPHOS 126 02/02/2022   BILITOT 0.5 02/02/2022      Microbiology: Recent Results (from the past 240 hour(s))  Resp panel by RT-PCR (RSV, Flu A&B, Covid) Anterior Nasal Swab     Status: None   Collection Time: 01/31/22  9:28 PM   Specimen: Anterior Nasal Swab  Result Value Ref Range Status   SARS Coronavirus 2 by RT PCR NEGATIVE NEGATIVE Final    Comment: (NOTE) SARS-CoV-2 target nucleic acids are NOT DETECTED.  The SARS-CoV-2 RNA is generally detectable in upper respiratory specimens during the acute phase of infection. The lowest concentration of SARS-CoV-2 viral copies this assay can detect is 138 copies/mL. A negative result does not preclude  SARS-Cov-2 infection and should not be used as the sole basis for treatment or other patient management decisions. A negative result may occur with  improper specimen collection/handling, submission of specimen other than nasopharyngeal swab, presence of viral mutation(s) within the areas targeted by this assay, and inadequate number of viral copies(<138 copies/mL). A negative result must be combined with clinical observations, patient history, and epidemiological information. The expected result is Negative.  Fact Sheet for Patients:  BloggerCourse.com  Fact Sheet for Healthcare Providers:  SeriousBroker.it  This test is no t yet approved or cleared by the [place] FDA and  has been authorized for detection and/or diagnosis of SARS-CoV-2 by FDA under an Emergency Use Authorization (EUA). This EUA will remain  in effect (meaning this test can be used) for the duration of the COVID-19 declaration under Section 564(b)(1) of the Act, 21 U.S.C.section 360bbb-3(b)(1), unless the authorization is terminated  or revoked sooner.       Influenza A by PCR NEGATIVE NEGATIVE Final   Influenza B by PCR NEGATIVE NEGATIVE Final    Comment: (NOTE) The Xpert Xpress SARS-CoV-2/FLU/RSV plus assay is intended as an aid in the diagnosis of influenza from Nasopharyngeal swab specimens and should not be used as a sole basis for treatment. Nasal washings and aspirates are unacceptable for Xpert Xpress SARS-CoV-2/FLU/RSV testing.  Fact Sheet for Patients: BloggerCourse.com  Fact Sheet for Healthcare Providers: SeriousBroker.it  This test is not yet approved or cleared by the [place] FDA and has been authorized for detection and/or diagnosis of SARS-CoV-2 by FDA under an Emergency Use Authorization (EUA). This EUA will remain in effect (meaning this test can be used) for the duration of  the COVID-19 declaration under Section 564(b)(1) of the Act, 21 U.S.C. section 360bbb-3(b)(1), unless the authorization is terminated or revoked.     Resp Syncytial Virus by PCR NEGATIVE NEGATIVE Final    Comment: (NOTE) Fact Sheet for Patients: BloggerCourse.com  Fact Sheet for Healthcare Providers: SeriousBroker.it  This test is not yet approved or cleared by the [place] FDA and has been authorized for detection and/or diagnosis of SARS-CoV-2 by FDA under an Emergency Use Authorization (EUA). This EUA will remain in effect (meaning this test can be used) for the duration of the COVID-19 declaration under Section 564(b)(1) of the Act, 21 U.S.C. section 360bbb-3(b)(1), unless the authorization is  terminated or revoked.  Performed at [facility], [address], [place], [place] [address]   Urine Culture     Status: Abnormal   Collection Time: 02/01/22  1:43 AM   Specimen: In/Out Cath Urine  Result Value Ref Range Status   Specimen Description IN/OUT CATH URINE  Final   Special Requests   Final    NONE Performed at [facility], [address] [place], [place] [address]    Culture (A)  Final    70,000 COLONIES/mL PROTEUS MIRABILIS 80,000 COLONIES/mL KLEBSIELLA PNEUMONIAE Confirmed Extended Spectrum Beta-Lactamase Producer (ESBL).  In bloodstream infections from ESBL organisms, carbapenems are preferred over piperacillin/tazobactam. They are shown to have a lower risk of mortality.    Report Status 02/04/2022 FINAL  Final   Organism ID, Bacteria PROTEUS MIRABILIS (A)  Final   Organism ID, Bacteria KLEBSIELLA PNEUMONIAE (A)  Final      Susceptibility   Klebsiella pneumoniae - MIC*    AMPICILLIN >=32 RESISTANT Resistant     CEFAZOLIN >=64 RESISTANT Resistant     CEFEPIME >=32 RESISTANT Resistant     CEFTRIAXONE >=64 RESISTANT Resistant     CIPROFLOXACIN 2 RESISTANT Resistant     GENTAMICIN >=16 RESISTANT  Resistant     IMIPENEM 0.5 SENSITIVE Sensitive     NITROFURANTOIN 64 INTERMEDIATE Intermediate     TRIMETH/SULFA >=320 RESISTANT Resistant     AMPICILLIN/SULBACTAM >=32 RESISTANT Resistant     PIP/TAZO 16 SENSITIVE Sensitive     * 80,000 COLONIES/mL KLEBSIELLA PNEUMONIAE   Proteus mirabilis - MIC*    AMPICILLIN <=2 SENSITIVE Sensitive     CEFAZOLIN <=4 SENSITIVE Sensitive     CEFEPIME <=0.12 SENSITIVE Sensitive     CEFTRIAXONE <=0.25 SENSITIVE Sensitive     CIPROFLOXACIN <=0.25 SENSITIVE Sensitive     GENTAMICIN <=1 SENSITIVE Sensitive     IMIPENEM 4 SENSITIVE Sensitive     NITROFURANTOIN 256 RESISTANT Resistant     TRIMETH/SULFA >=320 RESISTANT Resistant     AMPICILLIN/SULBACTAM <=2 SENSITIVE Sensitive     PIP/TAZO <=4 SENSITIVE Sensitive     * 70,000 COLONIES/mL PROTEUS MIRABILIS  Culture, blood (single)     Status: None (Preliminary result)   Collection Time: 02/01/22  2:14 AM   Specimen: BLOOD  Result Value Ref Range Status   Specimen Description BLOOD SITE NOT SPECIFIED  Final   Special Requests   Final    BOTTLES DRAWN AEROBIC AND ANAEROBIC Blood Culture adequate volume   Culture   Final    NO GROWTH 2 DAYS Performed at [facility], [address], [place], [place] [address]    Report Status PENDING  Incomplete  Culture, blood (Routine X 2) w Reflex to ID Panel     Status: None (Preliminary result)   Collection Time: 02/01/22  3:51 AM   Specimen: BLOOD  Result Value Ref Range Status   Specimen Description BLOOD BLOOD LEFT WRIST  Final   Special Requests   Final    BOTTLES DRAWN AEROBIC AND ANAEROBIC Blood Culture adequate volume   Culture   Final    NO GROWTH 2 DAYS Performed at [facility], [address] [place], [place] [address]    Report Status PENDING  Incomplete  MRSA Next Gen by PCR, Nasal     Status: None   Collection Time: 02/01/22  6:33 AM   Specimen: Nasal Mucosa; Nasal Swab  Result Value Ref Range Status   MRSA by PCR Next Gen  NOT  DETECTED NOT DETECTED Final    Comment: (NOTE) The GeneXpert MRSA Assay (FDA approved for NASAL specimens only), is one component of a comprehensive MRSA colonization surveillance program. It is not intended to diagnose MRSA infection nor to guide or monitor treatment for MRSA infections. Test performance is not FDA approved in patients less than 47 years old. Performed at [facility], [address], [place], [place] [address]   Culture, Respiratory w Gram Stain     Status: None (Preliminary result)   Collection Time: 02/01/22  1:58 PM   Specimen: Tracheal Aspirate; Respiratory  Result Value Ref Range Status   Specimen Description TRACHEAL ASPIRATE  Final   Special Requests NONE  Final   Gram Stain   Final    MODERATE GRAM NEGATIVE RODS RARE YEAST FEW WBC PRESENT, PREDOMINANTLY PMN    Culture   Final    MODERATE PSEUDOMONAS AERUGINOSA MODERATE ENTEROBACTER AEROGENES CULTURE REINCUBATED FOR BETTER GROWTH Performed at [facility], [address] [place], [place] [address]    Report Status PENDING  Incomplete     Serology:    Imaging: If present, new imagings (plain films, ct scans, and mri) have been personally visualized and interpreted; radiology reports have been reviewed. Decision making incorporated into the Impression / Recommendations.  1/12 head ct Atrophy, chronic microvascular disease. No acute intracranial abnormality.  1/12 chest xray 1. Increased interstitial and patchy hazy opacities in both lower lung fields concerning for bibasilar pneumonia or aspiration. Baseline low lung volumes. 2. Increased hazy opacity in the right mid lung, probably in the base of the upper lobe. 3. COPD. 4. Aortic atherosclerosis and uncoiling.    Raymondo Band, MD [facility] [phone] pager    02/04/2022, 11:50 AM

## 2022-02-04 NOTE — Progress Notes (Addendum)
NAME:  Austin Davis, MRN:  [id number], DOB:  1940-04-29, LOS: 3 ADMISSION DATE:  01/31/2022, CONSULTATION DATE: 02/01/2021 REFERRING MD: Yvonne Kendall pa, CHIEF COMPLAINT: Pneumonia  History of Present Illness:  This is a 82 year old gentleman past medical history of chronic respiratory failure, vent dependence, tracheostomy tube in place, history of seizure, Parkinson disease, A-fib not on AC.  Presents from [facility] with increased respiratory distress requiring vent support, bilateral infiltrates on chest x-ray concerning for pneumonia.  Previous culture results have reviewed revealed VRE and ESBL organisms.  He presents today with an elevated white count concern for sepsis.  Pertinent  Medical History   Past Medical History:  Diagnosis Date   CHF (congestive heart failure) ([place])    Coronary artery disease    Essential tremor    GI bleed 2019   hospitalized at [facility] for one week   Headache    since childhood   Low blood sugar    since childhood, controlled by diet   Mitral regurgitation    Mitral valve prolapse    Osteopenia 2021   Paroxysmal atrial fibrillation ([place])    Tremor      Significant Hospital Events: Including procedures, antibiotic start and stop dates in addition to other pertinent events   1/13 admitted with sepsis, started on mero linezolid  1/14 trach asp with moderate GNRs and rare yeast   Interim History / Subjective:  Patient spiked fever with Tmax 100.4 White count trended down Continue to have copious amount of tenacious secretions via trach also he started leaking from PEG tube site  Objective   Blood pressure 97/75, pulse 74, temperature 99.3 F (37.4 C), temperature source Axillary, resp. rate (!) 24, height 5\' 10"  (1.778 m), weight 44.6 kg, SpO2 97 %.    Vent Mode: PCV FiO2 (%):  [40 %] 40 % Set Rate:  [24 bmp] 24 bmp PEEP:  [5 cmH20] 5 cmH20 Plateau Pressure:  [19 cmH20-24 cmH20] 24 cmH20   Intake/Output Summary (Last 24  hours) at 02/04/2022 4098 Last data filed at 02/04/2022 0600 Gross per 24 hour  Intake 1767.62 ml  Output 2500 ml  Net -732.38 ml   Filed Weights   01/31/22 2103 02/02/22 0500 02/04/22 0428  Weight: 51.8 kg 51.6 kg 44.6 kg    Examination: Physical exam: General: No acute chronically ill-appearing male, lying on the bed HEENT: [place]/AT, eyes anicteric.  moist mucus membranes, s/p trach Neuro: Opens eyes with verbal stimuli, intermittently following commands, continues to have  intentional tremors Chest: Coarse breath sounds, no wheezes or rhonchi Heart: Regular rate and rhythm, no murmurs or gallops Abdomen: PEG tube in place, leaking with some excoriation of the skin around, soft, nontender, nondistended, bowel sounds present Skin: No rash  Sodium 137 Potassium 4.2 BUN 27 Creatinine 0.36 Serum phosphorus 2.5/serum magnesium 2.2 WBC 17.7 H/H 8.5/27.7  Resolved Hospital Problem list   Lactic acidosis   Assessment & Plan:  Sepsis due to bilateral multifocal pneumonia with gram-negative rods Acute UTI with Proteus and ESBL Klebsiella Hx MDRO, VRE, ESBL, enterococcus, klebsiella, pseudomonas  Sepsis indices are improving, though patient still spiking low-grade fever Respiratory culture is growing Pseudomonas, sensitivities pending Urine culture is growing Proteus and ESBL Klebsiella Continue IV meropenem Continue contact isolation considering history of ESBL/VRE White count continues to trend down Infectious disease consulted considering patient has ESBL and MDR organisms  Acute on chronic hypoxic respiratory failure Vent / trach dependence  Continue routine trach care Remained stable on FiO2 40%  and PEEP of 5 VAP prevention bundle in place Continue to have thick copious secretions via trach  Chronic hypotension  Continue midodrine  Anemia of chronic dz H&H remained stable Monitor and transfuse if less than 7  Paroxysmal A-fib Chronic HFrEF Remain in sinus  rhythm Monitor intake and output Not on anticoagulation due to prior GI bleeding  Hypothyroidism  Continue Synthroid  Parkinsons disease  Seizure disorder Anxiety disorder Continue Keppra and valproic acid Not on treatment for Parkinson's disease Continue alprazolam and sertraline  Malnutrition Continue tube feeds  Leaking PEG tube IR is consulted for help fix this leaking PEG tube Tube feeds are on hold for now  Best Practice (right click and "Reselect all SmartList Selections" daily)   Diet/type: NPO DVT prophylaxis: Subcu Lovenox GI prophylaxis: H2B Lines: N/A Foley:  N/A Code Status:  full code Last date of multidisciplinary goals of care discussion [pending]  Labs   CBC: Recent Labs  Lab 02/01/22 0004 02/01/22 0012 02/01/22 0013 02/01/22 0535 02/02/22 0048 02/03/22 0611  WBC 26.4*  --   --  22.5* 25.4* 17.7*  NEUTROABS 25.3*  --   --   --   --   --   HGB 10.4* 11.9* 11.9* 8.5* 9.7* 8.5*  HCT 35.5* 35.0* 35.0* 28.3* 31.3* 27.7*  MCV 88.8  --   --  87.9 85.5 85.0  PLT 457*  --   --  370 367 368    Basic Metabolic Panel: Recent Labs  Lab 02/01/22 0004 02/01/22 0012 02/01/22 0013 02/01/22 0535 02/01/22 0535 02/02/22 0048 02/02/22 1641 02/03/22 0611 02/03/22 1651 02/03/22 1936 02/04/22 0610  NA 139 139 140 137  --  137  --  137  --   --  137  K 5.1 5.2* 5.2* 4.2  --  3.5  --  4.3  --   --  4.2  CL 95* 94*  --  95*  --  95*  --  93*  --   --  100  CO2 33*  --   --  34*  --  34*  --  37*  --   --  26  GLUCOSE 157* 154*  --  102*  --  144*  --  166*  --   --  96  BUN 34* 35*  --  25*  --  19  --  17  --   --  27*  CREATININE 0.47* 0.50*  --  0.42*  --  0.41*  --  0.43*  --   --  0.36*  CALCIUM 9.2  --   --  8.4*  --  8.9  --  8.2*  --   --  8.4*  MG  --   --   --  1.7  --  1.7 2.0 1.9 2.2  --   --   PHOS  --   --   --  2.5   < > 2.3* 1.8* 1.5*  --  2.6 2.5   < > = values in this interval not displayed.   GFR: Estimated Creatinine Clearance:  45.7 mL/min (A) (by C-G formula based on SCr of 0.36 mg/dL (L)). Recent Labs  Lab 02/01/22 0004 02/01/22 0535 02/01/22 0701 02/01/22 1318 02/02/22 0048 02/03/22 0611  PROCALCITON  --   --   --  0.39 0.19 0.21  WBC 26.4* 22.5*  --   --  25.4* 17.7*  LATICACIDVEN 3.2*  --  2.0* 1.5  --   --  Liver Function Tests: Recent Labs  Lab 02/01/22 0004 02/01/22 0535 02/02/22 0048  AST 24 21 23   ALT 39 33 34  ALKPHOS 120 108 126  BILITOT 0.4 0.3 0.5  PROT 7.6 6.3* 7.2  ALBUMIN 2.0* 1.6* 1.9*   No results for input(s): "LIPASE", "AMYLASE" in the last 168 hours. Recent Labs  Lab 02/01/22 0535  AMMONIA 35    ABG    Component Value Date/Time   PHART 7.564 (H) 01/05/2022 0051   PCO2ART 31.7 (L) 01/05/2022 0051   PO2ART 122 (H) 01/05/2022 0051   HCO3 39.3 (H) 02/01/2022 0013   TCO2 41 (H) 02/01/2022 0013   ACIDBASEDEF 2.0 05/30/2021 0052   O2SAT 41 02/01/2022 0013     Coagulation Profile: No results for input(s): "INR", "PROTIME" in the last 168 hours.  Cardiac Enzymes: No results for input(s): "CKTOTAL", "CKMB", "CKMBINDEX", "TROPONINI" in the last 168 hours.  HbA1C: Hgb A1c MFr Bld  Date/Time Value Ref Range Status  01/04/2022 11:22 PM 5.3 4.8 - 5.6 % Final    Comment:    (NOTE)         Prediabetes: 5.7 - 6.4         Diabetes: >6.4         Glycemic control for adults with diabetes: <7.0   04/11/2021 09:12 PM 5.5 4.8 - 5.6 % Final    Comment:    (NOTE) Pre diabetes:          5.7%-6.4%  Diabetes:              >6.4%  Glycemic control for   <7.0% adults with diabetes     CBG: Recent Labs  Lab 02/03/22 1621 02/03/22 2012 02/04/22 0008 02/04/22 0414 02/04/22 0809  GLUCAP 123* 117* 110* 89 86   This patient is critically ill with multiple organ system failure which requires frequent high complexity decision making, assessment, support, evaluation, and titration of therapies. This was completed through the application of advanced monitoring technologies  and extensive interpretation of multiple databases.  During this encounter critical care time was devoted to patient care services described in this note for 35 minutes.    02/06/22, MD [facility] See Amion for pager If no response to pager, please call [phone] until 7pm After 7pm, Please call E-link [phone]

## 2022-02-04 NOTE — Progress Notes (Signed)
Referring Physician(s): Dr. Cheri Fowler  Supervising Physician: Irish Lack  Patient Status:  [facility] - In-pt  Chief Complaint:  Leaking g-tube  HPI:  Austin Davis is a 82 y.o. male with multiple medical issues. Had a balloon retention perc G-tube placed 09/2020  for dysphagia/malnutrition by this IR team. Now with a 24 Fr balloon retention. Currently admitted for chronic resp failure, trach/vent dependent. Team noticing G-tube leaking. IR is asked to eval.  Subjective:  Patient sleeping.  Does not open eyes to voice or during exam.  Allergies: Prednisone and Cortisone  Medications: Prior to Admission medications   Medication Sig Start Date End Date Taking? Authorizing Provider  ALPRAZolam Prudy Feeler) 0.5 MG tablet Place 0.5 mg into feeding tube every 8 (eight) hours as needed for anxiety.   Yes [provider]  B Complex-C-Folic Acid (DIALYVITE TABLET) TABS Place 1 tablet into feeding tube daily.   Yes [provider]  esomeprazole (NEXIUM) 40 MG capsule Take 40 mg by mouth daily. Via feeding tube   Yes [provider]  fluticasone (FLONASE) 50 MCG/ACT nasal spray Place 1 spray into both nostrils every 6 (six) hours as needed for allergies or rhinitis.   Yes [provider]  folic acid (FOLVITE) 1 MG tablet Take 1 tablet (1 mg total) by mouth daily. Patient taking differently: Place 1 mg into feeding tube daily. 09/29/20  Yes Azucena Fallen, MD  furosemide (LASIX) 20 MG tablet Place 1 tablet (20 mg total) into feeding tube daily as needed for fluid or edema. Patient taking differently: Place 20 mg into feeding tube daily. 07/03/21  Yes Almon Hercules, MD  hydrOXYzine (ATARAX) 25 MG tablet Take 25 mg by mouth every 6 (six) hours as needed for anxiety. 11/01/21  Yes [provider]  hydrOXYzine (ATARAX) 50 MG tablet Place 50 mg into feeding tube every 6 (six) hours.   Yes [provider]  Lactobacillus Rhamnosus, GG,  (CULTURELLE PO) Place 10 Billion Cells into feeding tube daily.   Yes [provider]  lactulose (CHRONULAC) 10 GM/15ML solution Place 20 g into feeding tube every 12 (twelve) hours as needed (constipation).   Yes [provider]  levalbuterol (XOPENEX) 1.25 MG/3ML nebulizer solution Take 1.25 mg by nebulization every 6 (six) hours.   Yes [provider]  levETIRAcetam (KEPPRA) 100 MG/ML solution Place 1,000 mg into feeding tube 2 (two) times daily.   Yes [provider]  levothyroxine (SYNTHROID) 25 MCG tablet Place 25 mcg into feeding tube daily.   Yes [provider]  Melatonin 3 MG TBDP Place 6 mg into feeding tube at bedtime.   Yes [provider]  metoprolol tartrate (LOPRESSOR) 25 MG tablet Take 25 mg by mouth as needed (AFIB. Do not administer if low BP, systolic <60).   Yes [provider]  midodrine (PROAMATINE) 10 MG tablet Place 1 tablet (10 mg total) into feeding tube 3 (three) times daily with meals. Patient taking differently: Place 10 mg into feeding tube 3 (three) times daily. 05/01/21  Yes Leroy Sea, MD  Nutritional Supplements (FEEDING SUPPLEMENT, OSMOLITE 1.5 CAL,) LIQD Place 55 mL/hr into feeding tube See admin instructions. 55 ml/hr for 22-24 hours = 1815-1980 kcal & 1520-1603 ml   Yes [provider]  Omega-3 Fatty Acids (FISH OIL) 1000 MG CAPS Take 1,000 mg by mouth daily.   Yes [provider]  polyethylene glycol (MIRALAX / GLYCOLAX) 17 g packet Place 17 g into feeding tube  every other day.   Yes [provider]  propranolol (INDERAL) 10 MG tablet Take 10 mg by mouth daily as needed (Tremor. Hold if HR is less than 60 or SBP less than 90).   Yes [provider]  sertraline (ZOLOFT) 25 MG tablet Place 25 mg into feeding tube daily.   Yes [provider]  tamsulosin (FLOMAX) 0.4 MG CAPS capsule Take 0.4 mg by mouth daily. Via feeding tube   Yes [provider]  valproic acid (DEPAKENE) 250 MG/5ML solution Place 250 mg into feeding tube 2 (two) times daily.   Yes [provider]  Water For Irrigation, Sterile (STERILE WATER FOR IRRIGATION) Irrigate with 100 mLs as directed See admin instructions. 100 ml H2O flush every 4 hours   Yes [provider]  acetaminophen (TYLENOL) 325 MG tablet Take 650 mg by mouth every 8 (eight) hours as needed for mild pain or fever.    [provider]  LORazepam (ATIVAN) 2 MG/ML injection Inject 2 mg into the vein as needed (seizures).    [provider]     Vital Signs: BP 111/78   Pulse 71   Temp 97.7 F (36.5 C) (Axillary)   Resp (!) 24   Ht 5\' 10"  (1.778 m)   Wt 98 lb 5.2 oz (44.6 kg)   SpO2 99%   BMI 14.11 kg/m   Physical Exam Vitals reviewed.  Constitutional:      General: He is sleeping. He is in acute distress.     Appearance: He is ill-appearing.     Comments: trach'd  Abdominal:     General: Abdomen is flat.     Palpations: Abdomen is soft.     Comments: 24 Fr G-tube in place.  Small amount of yellow acidic appearing gastric contents in circumferential pattern on gauze.  Skin:    Comments: Skin about g-tube tract with chronic changes, some current maceration     Imaging: CT Head Wo Contrast  Result Date: 01/31/2022 CLINICAL DATA:  Mental status change, unknown cause seizure, prolonged post ictal state EXAM: CT HEAD WITHOUT CONTRAST TECHNIQUE: Contiguous axial images were obtained from the base of the skull through the vertex without intravenous contrast. RADIATION DOSE REDUCTION: This exam was performed according to the departmental dose-optimization program which includes automated exposure control, adjustment of the mA and/or kV according to patient size and/or use of iterative reconstruction technique. COMPARISON:  01/04/2022 FINDINGS: Brain: There is atrophy and chronic small vessel disease changes. No acute intracranial abnormality. Specifically, no  hemorrhage, hydrocephalus, mass lesion, acute infarction, or significant intracranial injury. Vascular: No hyperdense vessel or unexpected calcification. Skull: No acute calvarial abnormality. Sinuses/Orbits: No acute findings Other: None IMPRESSION: Atrophy, chronic microvascular disease. No acute intracranial abnormality. Electronically Signed   By: 01/06/2022 M.D.   On: 01/31/2022 23:13   DG Chest 1 View  Result Date: 01/31/2022 CLINICAL DATA:  Seizure evaluation, possibly related to infectious process. EXAM: CHEST  1 VIEW COMPARISON:  Portable chest 01/04/2022, portable chest 11/01/2021 FINDINGS: Tracheostomy cannula is in place with the tip 5.5 cm from the carina. There are intact median sternotomy sutures. The cardiac size is normal. The mediastinum is stable with aortic tortuosity and calcification. Central vascular markings are normal caliber. There is a low inspiration on exam. Similar depth of insufflation as on the prior study but with increased interstitial and patchy hazy opacities in both lower lung fields concerning for bibasilar pneumonia or aspiration. There is additional increased hazy opacity  in the right mid lung, probably in the base of the upper lobe. Remaining lungs are clear with COPD change. No pleural effusion is seen. Osteopenia and degenerative change thoracic spine. IMPRESSION: 1. Increased interstitial and patchy hazy opacities in both lower lung fields concerning for bibasilar pneumonia or aspiration. Baseline low lung volumes. 2. Increased hazy opacity in the right mid lung, probably in the base of the upper lobe. 3. COPD. 4. Aortic atherosclerosis and uncoiling. Electronically Signed   By: Telford Nab M.D.   On: 01/31/2022 22:13    Labs:  CBC: Recent Labs    02/01/22 0004 02/01/22 0012 02/01/22 0013 02/01/22 0535 02/02/22 0048 02/03/22 0611  WBC 26.4*  --   --  22.5* 25.4* 17.7*  HGB 10.4*   < > 11.9* 8.5* 9.7* 8.5*  HCT 35.5*   < > 35.0* 28.3* 31.3* 27.7*   PLT 457*  --   --  370 367 368   < > = values in this interval not displayed.    COAGS: Recent Labs    03/26/21 1036 05/08/21 1145 10/26/21 1508 01/04/22 1940  INR 1.1 1.0 1.2 1.3*  APTT 30  --  30 40*    BMP: Recent Labs    02/01/22 0535 02/02/22 0048 02/03/22 0611 02/04/22 0610  NA 137 137 137 137  K 4.2 3.5 4.3 4.2  CL 95* 95* 93* 100  CO2 34* 34* 37* 26  GLUCOSE 102* 144* 166* 96  BUN 25* 19 17 27*  CALCIUM 8.4* 8.9 8.2* 8.4*  CREATININE 0.42* 0.41* 0.43* 0.36*  GFRNONAA >60 >60 >60 >60    LIVER FUNCTION TESTS: Recent Labs    01/04/22 1940 02/01/22 0004 02/01/22 0535 02/02/22 0048  BILITOT 0.3 0.4 0.3 0.5  AST 31 24 21 23   ALT 35 39 33 34  ALKPHOS 155* 120 108 126  PROT 9.1* 7.6 6.3* 7.2  ALBUMIN 2.8* 2.0* 1.6* 1.9*    Assessment and Plan:  Leaking gastrostomy tube --Cinched external bumper snug with skin. Once snug, the tube was flushed easily with water, no evidence of leakage. --This leakage will continue to be difficult to manage as the skin folds naturally want to push external bumper up the tube. This allows for laxity of the tube and thus the leakage.  Recommend wound care to assist maintaining skin integrity, frequent dressing changes with only minimal gauze material, and frequent re-cinching of external bumper disc to the level of the skin. --Can consider exchanging/upsizing tube, but suspect this will not alleviate the issue due to the location of the skin fold.  IR available as needed.  Electronically Signed: Pasty Spillers, PA 02/04/2022, 12:51 PM   I spent a total of 15 Minutes at the the patient's bedside AND on the patient's hospital floor or unit, greater than 50% of which was counseling/coordinating care for leaking g-tube

## 2022-02-04 NOTE — Progress Notes (Signed)
Elink MD notified d/t copious amounts of bile like secretions leaking around peg tube site. Pt has green thick secretions leaking around trach site. RT/ MD aware.

## 2022-02-04 NOTE — Progress Notes (Addendum)
Pharmacy Antibiotic Note  Austin Davis is a 82 y.o. male admitted on 01/31/2022 with pneumonia and hx VRE and ESBL. Pharmacy has been consulted for Merrem dosing.  -WBC= 17.7, tmax= 100.5, CrCl ~ 45 -respiratory cultures with pseudomonas, enterobacter aerogenes -urine cultures with proteus mirabilis (sensitive to imipenem) and ESBL klebsiella pneumonia   Plan: Continue meropenem 1gm IV q8h Trend WBC, temp, renal function  F/U infectious work-up  Height: 5\' 10"  (177.8 cm) Weight: 44.6 kg (98 lb 5.2 oz) IBW/kg (Calculated) : 73  Temp (24hrs), Avg:99.4 F (37.4 C), Min:98.4 F (36.9 C), Max:100.4 F (38 C)  Recent Labs  Lab 02/01/22 0004 02/01/22 0012 02/01/22 0535 02/01/22 0701 02/01/22 1318 02/02/22 0048 02/03/22 0611 02/04/22 0610  WBC 26.4*  --  22.5*  --   --  25.4* 17.7*  --   CREATININE 0.47* 0.50* 0.42*  --   --  0.41* 0.43* 0.36*  LATICACIDVEN 3.2*  --   --  2.0* 1.5  --   --   --      Estimated Creatinine Clearance: 45.7 mL/min (A) (by C-G formula based on SCr of 0.36 mg/dL (L)).    Allergies  Allergen Reactions   Prednisone Other (See Comments)    Listed as allergy on MAR Unknown reaction   Cortisone Other (See Comments)    Listed as allergy per MAR Unknown reaction    02/06/22, PharmD Clinical Pharmacist **Pharmacist phone directory can now be found on [email or web address] ([email or web address]).  Listed under [facility].

## 2022-02-04 NOTE — Progress Notes (Signed)
Nutrition Follow-up  DOCUMENTATION CODES:   Underweight, Severe malnutrition in context of chronic illness  INTERVENTION:   Recommend holding TF if significant leakage from PET site. Noted IR consulted for evaluation  Tube Feeding via PEG:  Increase Osmolite 1.5 to 60 ml/hr This provides 2160 kcals, 90 g of protein and 1094 mL of free water  D/C Pro-Source TF20 60 mL daily  Continue Juven BID, each packet provides 80 calories, 8 grams of carbohydrate, 2.5  grams of protein (collagen), 7 grams of L-arginine and 7 grams of L-glutamine; supplement contains CaHMB, Vitamins C, E, B12 and Zinc to promote wound healing   NUTRITION DIAGNOSIS:   Severe Malnutrition related to chronic illness as evidenced by severe muscle depletion, severe fat depletion.  Being addressed via TF   GOAL:   Patient will meet greater than or equal to 90% of their needs  Progressing  MONITOR:   TF tolerance, Labs, Weight trends, Skin  REASON FOR ASSESSMENT:   Consult Enteral/tube feeding initiation and management  ASSESSMENT:   Pt admitted from [facility] with increased respiratory distress d/t concern for PNA. PMH significant for chronic respiratory failure, vent dependence, trach, PEG, seizure, Parkinson's disease, afib.  1/13 Admitted  Pt remains on vent support via trach   Febrile, Tmax 100.4, WBC trending down  PEG leaking bile overnight. Noted pt has experienced leakage around PEG site on other admissions in 2023. Noted 24 french PEG tube in place.  Per RN, pt also leaking green thick secretions around trach Noted no abdominal films from this admission.   TF held overnight due to leakage. IR consulted. TF restarted just now  Hypophosphatemia resolved with close monitoring and supplementation, current level 2.5.   Current wt 44.6 kg, admit weight 51.6 kg. Based on weight encounters and previous RD assessments from other admissions, pt has experienced weight loss. Pt is underweight and  severely malnourished  Noted heights from previous admission have ranged from 5'7" inches to 5'10". Unable to clarify at this time  Per Home Med list, pt has been receiving Osmolite 1.5 at 55 ml/hr for 22-24 hours at [facility]: reviewed Meds: ss novolog  NUTRITION - FOCUSED PHYSICAL EXAM:  Flowsheet Row Most Recent Value  Orbital Region Severe depletion  Upper Arm Region Unable to assess  Thoracic and Lumbar Region Severe depletion  Buccal Region Severe depletion  Temple Region Severe depletion  Clavicle Bone Region Severe depletion  Clavicle and Acromion Bone Region Severe depletion  Scapular Bone Region Severe depletion  Dorsal Hand Severe depletion  Patellar Region Severe depletion  Anterior Thigh Region Severe depletion  Posterior Calf Region Unable to assess  [edema]  Edema (RD Assessment) Moderate       Diet Order:   Diet Order             Diet NPO time specified  Diet effective now                   EDUCATION NEEDS:   Not appropriate for education at this time  Skin:  Skin Assessment: Skin Integrity Issues: Skin Integrity Issues:: Stage II Stage II: L buttocks  Last BM:  1/16-type 7 large  Height:   Ht Readings from Last 1 Encounters:  02/04/22 5\' 10"  (1.778 m)    Weight:   Wt Readings from Last 1 Encounters:  02/04/22 44.6 kg    BMI:  Body mass index is 14.11 kg/m.  Estimated Nutritional Needs:   Kcal:  1700-1900  Protein:  85-95g  Fluid:  >/=1.7L   Romelle Starcher MS, RDN, LDN, CNSC Registered Dietitian 3 Clinical Nutrition RD Pager and On-Call Pager Number Located in [place]

## 2022-02-05 DIAGNOSIS — Z1624 Resistance to multiple antibiotics: Secondary | ICD-10-CM

## 2022-02-05 DIAGNOSIS — Z930 Tracheostomy status: Secondary | ICD-10-CM | POA: Diagnosis not present

## 2022-02-05 DIAGNOSIS — J189 Pneumonia, unspecified organism: Secondary | ICD-10-CM | POA: Diagnosis not present

## 2022-02-05 DIAGNOSIS — R569 Unspecified convulsions: Secondary | ICD-10-CM | POA: Diagnosis not present

## 2022-02-05 LAB — GLUCOSE, CAPILLARY
Glucose-Capillary: 134 mg/dL — ABNORMAL HIGH (ref 70–99)
Glucose-Capillary: 139 mg/dL — ABNORMAL HIGH (ref 70–99)
Glucose-Capillary: 142 mg/dL — ABNORMAL HIGH (ref 70–99)
Glucose-Capillary: 142 mg/dL — ABNORMAL HIGH (ref 70–99)
Glucose-Capillary: 144 mg/dL — ABNORMAL HIGH (ref 70–99)
Glucose-Capillary: 148 mg/dL — ABNORMAL HIGH (ref 70–99)
Glucose-Capillary: 99 mg/dL (ref 70–99)

## 2022-02-05 LAB — PHOSPHORUS: Phosphorus: 2.2 mg/dL — ABNORMAL LOW (ref 2.5–4.6)

## 2022-02-05 MED ORDER — SODIUM PHOSPHATES 45 MMOLE/15ML IV SOLN
15.0000 mmol | Freq: Once | INTRAVENOUS | Status: AC
  Administered 2022-02-05: 15 mmol via INTRAVENOUS
  Filled 2022-02-05: qty 5

## 2022-02-05 NOTE — Progress Notes (Signed)
Id brief note  Respiratory cx reviewed --> all organisms sensitive to meropenem  Picture consistent with HAP  Urine cultures positive and suspect assymptomatic bacteriuria.   Eitherway 7 day meropenem should cover all   Discussed with primary team

## 2022-02-05 NOTE — Progress Notes (Signed)
NAME:  Austin Davis, MRN:  [id number], DOB:  01/03/1941, LOS: 4 ADMISSION DATE:  01/31/2022, CONSULTATION DATE: 02/01/2021 REFERRING MD: Yvonne Kendall pa, CHIEF COMPLAINT: Pneumonia  History of Present Illness:  This is a 82 year old gentleman past medical history of chronic respiratory failure, vent dependence, tracheostomy tube in place, history of seizure, Parkinson disease, A-fib not on AC.  Presents from [facility] with increased respiratory distress requiring vent support, bilateral infiltrates on chest x-ray concerning for pneumonia.  Previous culture results have reviewed revealed VRE and ESBL organisms.  He presents today with an elevated white count concern for sepsis.  Pertinent  Medical History   Past Medical History:  Diagnosis Date   CHF (congestive heart failure) ([place])    Coronary artery disease    Essential tremor    GI bleed 2019   hospitalized at [facility] for one week   Headache    since childhood   Low blood sugar    since childhood, controlled by diet   Mitral regurgitation    Mitral valve prolapse    Osteopenia 2021   Paroxysmal atrial fibrillation ([place])    Tremor      Significant Hospital Events: Including procedures, antibiotic start and stop dates in addition to other pertinent events   1/13 admitted with sepsis, started on mero linezolid  1/14 trach asp with moderate GNRs and rare yeast   Interim History / Subjective:  Patient became afebrile No overnight issues  Objective   Blood pressure 116/77, pulse 90, temperature 98.1 F (36.7 C), temperature source Oral, resp. rate (!) 24, height 5\' 10"  (1.778 m), weight 48.7 kg, SpO2 100 %.    Vent Mode: PCV FiO2 (%):  [40 %] 40 % Set Rate:  [24 bmp] 24 bmp PEEP:  [5 cmH20] 5 cmH20 Plateau Pressure:  [22 cmH20-26 cmH20] 25 cmH20   Intake/Output Summary (Last 24 hours) at 02/05/2022 0920 Last data filed at 02/05/2022 0900 Gross per 24 hour  Intake 1884.86 ml  Output 1650 ml  Net 234.86 ml    Filed Weights   02/02/22 0500 02/04/22 0428 02/05/22 0430  Weight: 51.6 kg 44.6 kg 48.7 kg    Examination: Physical exam: General: Chronically ill-appearing male, lying on the bed s/p trach on full mechanical ventilatory support HEENT: [place]/AT, eyes anicteric.  moist mucus membranes Neuro: Opens eyes with vocal, has intentional and resting tremor, intermittently following commands Chest: Coarse breath sounds, no wheezes or rhonchi Heart: Regular rate and rhythm, no murmurs or gallops Abdomen: Soft, nontender, nondistended, bowel sounds present Skin: No rash   Resolved Hospital Problem list   Lactic acidosis   Assessment & Plan:  Sepsis due to bilateral multifocal pneumonia with Pseudomonas aeruginosa/Enterobacter aerogenes and E. coli Acute UTI with Proteus and ESBL Klebsiella Hx MDRO, VRE, ESBL, enterococcus, klebsiella, pseudomonas  Sepsis indices have improved Patient is afebrile Respiratory culture is growing multiple organisms including Pseudomonas, Enterobacter and E. coli Urine culture is growing Proteus and ESBL Klebsiella Continue IV meropenem to complete 7 days therapy per discussion with infectious disease  Acute on chronic hypoxic respiratory failure Vent / trach dependence  Continue routine trach care FiO2 and PEEP titrated down to 40% and 5 respectively VAP prevention bundle in place Continue chest PT  Chronic hypotension  Continue midodrine  Anemia of chronic dz H&H remained stable Monitor and transfuse if less than 7  Paroxysmal A-fib Chronic HFrEF Remain in sinus rhythm Monitor intake and output Not on anticoagulation due to prior GI bleeding  Hypothyroidism  Continue Synthroid  Parkinsons disease  Seizure disorder Anxiety disorder Continue Keppra and valproic acid Not on treatment for Parkinson's disease Continue alprazolam and sertraline  Malnutrition Continue tube feeds  Leaking PEG tube IR repaired PEG tube leaking, recommend  upsizing the PEG tube which will be done today  Best Practice (right click and "Reselect all SmartList Selections" daily)   Diet/type: NPO tube feeds DVT prophylaxis: Subcu Lovenox GI prophylaxis: H2B Lines: N/A Foley:  N/A Code Status:  full code Last date of multidisciplinary goals of care discussion [1/17: Continue full scope of care per discussion with patient's daughter]  Labs   CBC: Recent Labs  Lab 02/01/22 0004 02/01/22 0012 02/01/22 0013 02/01/22 0535 02/02/22 0048 02/03/22 0611  WBC 26.4*  --   --  22.5* 25.4* 17.7*  NEUTROABS 25.3*  --   --   --   --   --   HGB 10.4* 11.9* 11.9* 8.5* 9.7* 8.5*  HCT 35.5* 35.0* 35.0* 28.3* 31.3* 27.7*  MCV 88.8  --   --  87.9 85.5 85.0  PLT 457*  --   --  370 367 161    Basic Metabolic Panel: Recent Labs  Lab 02/01/22 0004 02/01/22 0012 02/01/22 0013 02/01/22 0535 02/01/22 0535 02/02/22 0048 02/02/22 1641 02/03/22 0611 02/03/22 1651 02/03/22 1936 02/04/22 0610 02/04/22 1645 02/05/22 0652  NA 139 139 140 137  --  137  --  137  --   --  137  --   --   K 5.1 5.2* 5.2* 4.2  --  3.5  --  4.3  --   --  4.2  --   --   CL 95* 94*  --  95*  --  95*  --  93*  --   --  100  --   --   CO2 33*  --   --  34*  --  34*  --  37*  --   --  26  --   --   GLUCOSE 157* 154*  --  102*  --  144*  --  166*  --   --  96  --   --   BUN 34* 35*  --  25*  --  19  --  17  --   --  27*  --   --   CREATININE 0.47* 0.50*  --  0.42*  --  0.41*  --  0.43*  --   --  0.36*  --   --   CALCIUM 9.2  --   --  8.4*  --  8.9  --  8.2*  --   --  8.4*  --   --   MG  --   --   --  1.7  --  1.7 2.0 1.9 2.2  --   --   --   --   PHOS  --   --   --  2.5   < > 2.3* 1.8* 1.5*  --  2.6 2.5 2.3* 2.2*   < > = values in this interval not displayed.   GFR: Estimated Creatinine Clearance: 49.9 mL/min (A) (by C-G formula based on SCr of 0.36 mg/dL (L)). Recent Labs  Lab 02/01/22 0004 02/01/22 0535 02/01/22 0701 02/01/22 1318 02/02/22 0048 02/03/22 0611   PROCALCITON  --   --   --  0.39 0.19 0.21  WBC 26.4* 22.5*  --   --  25.4* 17.7*  LATICACIDVEN 3.2*  --  2.0* 1.5  --   --  Liver Function Tests: Recent Labs  Lab 02/01/22 0004 02/01/22 0535 02/02/22 0048  AST 24 21 23   ALT 39 33 34  ALKPHOS 120 108 126  BILITOT 0.4 0.3 0.5  PROT 7.6 6.3* 7.2  ALBUMIN 2.0* 1.6* 1.9*   No results for input(s): "LIPASE", "AMYLASE" in the last 168 hours. Recent Labs  Lab 02/01/22 0535  AMMONIA 35    ABG    Component Value Date/Time   PHART 7.564 (H) 01/05/2022 0051   PCO2ART 31.7 (L) 01/05/2022 0051   PO2ART 122 (H) 01/05/2022 0051   HCO3 39.3 (H) 02/01/2022 0013   TCO2 41 (H) 02/01/2022 0013   ACIDBASEDEF 2.0 05/30/2021 0052   O2SAT 41 02/01/2022 0013     Coagulation Profile: No results for input(s): "INR", "PROTIME" in the last 168 hours.  Cardiac Enzymes: No results for input(s): "CKTOTAL", "CKMB", "CKMBINDEX", "TROPONINI" in the last 168 hours.  HbA1C: Hgb A1c MFr Bld  Date/Time Value Ref Range Status  01/04/2022 11:22 PM 5.3 4.8 - 5.6 % Final    Comment:    (NOTE)         Prediabetes: 5.7 - 6.4         Diabetes: >6.4         Glycemic control for adults with diabetes: <7.0   04/11/2021 09:12 PM 5.5 4.8 - 5.6 % Final    Comment:    (NOTE) Pre diabetes:          5.7%-6.4%  Diabetes:              >6.4%  Glycemic control for   <7.0% adults with diabetes     CBG: Recent Labs  Lab 02/04/22 1600 02/04/22 1952 02/05/22 0008 02/05/22 0353 02/05/22 0733  GLUCAP 122* 114* 148* 134* 142*   This patient is critically ill with multiple organ system failure which requires frequent high complexity decision making, assessment, support, evaluation, and titration of therapies. This was completed through the application of advanced monitoring technologies and extensive interpretation of multiple databases.  During this encounter critical care time was devoted to patient care services described in this note for 32  minutes.    02/07/22, MD [facility] See Amion for pager If no response to pager, please call [phone] until 7pm After 7pm, Please call E-link [phone]

## 2022-02-05 NOTE — Progress Notes (Signed)
Referring Physician(s): Dr. Cheri Fowler  Supervising Physician: Marliss Coots  Patient Status:  Austin Davis - In-pt  Chief Complaint: Leaking g-tube   Subjective: Arouses to stimulation, but non-verbal.  G-tube in place.  Dressing clean and dry.   Allergies: Prednisone and Cortisone  Medications: Prior to Admission medications   Medication Sig Start Date End Date Taking? Authorizing Provider  ALPRAZolam Prudy Feeler) 0.5 MG tablet Place 0.5 mg into feeding tube every 8 (eight) hours as needed for anxiety.   Yes [provider]  B Complex-C-Folic Acid (DIALYVITE TABLET) TABS Place 1 tablet into feeding tube daily.   Yes [provider]  esomeprazole (NEXIUM) 40 MG capsule Take 40 mg by mouth daily. Via feeding tube   Yes [provider]  fluticasone (FLONASE) 50 MCG/ACT nasal spray Place 1 spray into both nostrils every 6 (six) hours as needed for allergies or rhinitis.   Yes [provider]  folic acid (FOLVITE) 1 MG tablet Take 1 tablet (1 mg total) by mouth daily. Patient taking differently: Place 1 mg into feeding tube daily. 09/29/20  Yes Azucena Fallen, MD  furosemide (LASIX) 20 MG tablet Place 1 tablet (20 mg total) into feeding tube daily as needed for fluid or edema. Patient taking differently: Place 20 mg into feeding tube daily. 07/03/21  Yes Almon Hercules, MD  hydrOXYzine (ATARAX) 25 MG tablet Take 25 mg by mouth every 6 (six) hours as needed for anxiety. 11/01/21  Yes [provider]  hydrOXYzine (ATARAX) 50 MG tablet Place 50 mg into feeding tube every 6 (six) hours.   Yes [provider]  Lactobacillus Rhamnosus, GG, (CULTURELLE PO) Place 10 Billion Cells into feeding tube daily.   Yes [provider]  lactulose (CHRONULAC) 10 GM/15ML solution Place 20 g into feeding tube every 12 (twelve) hours as needed (constipation).   Yes [provider]  levalbuterol (XOPENEX) 1.25 MG/3ML nebulizer solution Take  1.25 mg by nebulization every 6 (six) hours.   Yes [provider]  levETIRAcetam (KEPPRA) 100 MG/ML solution Place 1,000 mg into feeding tube 2 (two) times daily.   Yes [provider]  levothyroxine (SYNTHROID) 25 MCG tablet Place 25 mcg into feeding tube daily.   Yes [provider]  Melatonin 3 MG TBDP Place 6 mg into feeding tube at bedtime.   Yes [provider]  metoprolol tartrate (LOPRESSOR) 25 MG tablet Take 25 mg by mouth as needed (AFIB. Do not administer if low BP, systolic <60).   Yes [provider]  midodrine (PROAMATINE) 10 MG tablet Place 1 tablet (10 mg total) into feeding tube 3 (three) times daily with meals. Patient taking differently: Place 10 mg into feeding tube 3 (three) times daily. 05/01/21  Yes Leroy Sea, MD  Nutritional Supplements (FEEDING SUPPLEMENT, OSMOLITE 1.5 CAL,) LIQD Place 55 mL/hr into feeding tube See admin instructions. 55 ml/hr for 22-24 hours = 1815-1980 kcal & 1520-1603 ml   Yes [provider]  Omega-3 Fatty Acids (FISH OIL) 1000 MG CAPS Take 1,000 mg by mouth daily.   Yes [provider]  polyethylene glycol (MIRALAX / GLYCOLAX) 17 g packet Place 17 g into feeding tube every other day.   Yes [provider]  propranolol (INDERAL) 10 MG tablet Take 10 mg by mouth daily as needed (Tremor. Hold if HR is less than 60 or SBP less than 90).   Yes [provider]  sertraline (ZOLOFT) 25 MG tablet Place 25 mg into  feeding tube daily.   Yes [provider]  tamsulosin (FLOMAX) 0.4 MG CAPS capsule Take 0.4 mg by mouth daily. Via feeding tube   Yes [provider]  valproic acid (DEPAKENE) 250 MG/5ML solution Place 250 mg into feeding tube 2 (two) times daily.   Yes [provider]  Water For Irrigation, Sterile (STERILE WATER FOR IRRIGATION) Irrigate with 100 mLs as directed See admin instructions. 100 ml H2O flush every 4 hours   Yes [provider]  acetaminophen (TYLENOL) 325 MG tablet Take 650 mg by mouth every 8 (eight) hours as needed for mild pain or fever.    [provider]  LORazepam (ATIVAN) 2 MG/ML injection Inject 2 mg into the vein as needed (seizures).    [provider]     Vital Signs: BP (!) 110/90   Pulse 82   Temp 98.6 F (37 C) (Oral)   Resp (!) 24   Ht 5\' 10"  (1.778 m)   Wt 107 lb 5.8 oz (48.7 kg)   SpO2 98%   BMI 15.41 kg/m   Physical Exam NAD, trach/vent Abdomen: soft, non-distended.  G-tube in place.  No leaking during visit.  Dressing clean and dry-- 2 layers of gauze under bumper.  Skin: stable around G-tube insertion site, no further breakdown.  Imaging: No results found.  Labs:  CBC: Recent Labs    02/01/22 0004 02/01/22 0012 02/01/22 0013 02/01/22 0535 02/02/22 0048 02/03/22 0611  WBC 26.4*  --   --  22.5* 25.4* 17.7*  HGB 10.4*   < > 11.9* 8.5* 9.7* 8.5*  HCT 35.5*   < > 35.0* 28.3* 31.3* 27.7*  PLT 457*  --   --  370 367 368   < > = values in this interval not displayed.     COAGS: Recent Labs    03/26/21 1036 05/08/21 1145 10/26/21 1508 01/04/22 1940  INR 1.1 1.0 1.2 1.3*  APTT 30  --  30 40*     BMP: Recent Labs    02/01/22 0535 02/02/22 0048 02/03/22 0611 02/04/22 0610  NA 137 137 137 137  K 4.2 3.5 4.3 4.2  CL 95* 95* 93* 100  CO2 34* 34* 37* 26  GLUCOSE 102* 144* 166* 96  BUN 25* 19 17 27*  CALCIUM 8.4* 8.9 8.2* 8.4*  CREATININE 0.42* 0.41* 0.43* 0.36*  GFRNONAA >60 >60 >60 >60     LIVER FUNCTION TESTS: Recent Labs    01/04/22 1940 02/01/22 0004 02/01/22 0535 02/02/22 0048  BILITOT 0.3 0.4 0.3 0.5  AST 31 24 21 23   ALT 35 39 33 34  ALKPHOS 155* 120 108 126  PROT 9.1* 7.6 6.3* 7.2  ALBUMIN 2.8* 2.0* 1.6* 1.9*     Assessment and Plan:  Leaking gastrostomy tube Since being cinched yesterday, there has been no further leakage.  Confirmed tube is stable without further issue per RN.  TF infusing.   Dressing clean and dry.  2 layers of gauze underneath bumper were removed, 1 layer replaced over top of tube.   Continue current management.   Order for drain exchange canceled as this is not currently indicated.   IR available as needed.  Electronically Signed: 02/04/22, PA 02/05/2022, 12:47 PM   I spent a total of 15 Minutes at the the patient's bedside AND on the patient's hospital floor or unit, greater than 50% of which was counseling/coordinating care for leaking g-tube

## 2022-02-06 DIAGNOSIS — I4891 Unspecified atrial fibrillation: Secondary | ICD-10-CM | POA: Diagnosis not present

## 2022-02-06 DIAGNOSIS — J189 Pneumonia, unspecified organism: Secondary | ICD-10-CM | POA: Diagnosis not present

## 2022-02-06 DIAGNOSIS — R569 Unspecified convulsions: Secondary | ICD-10-CM | POA: Diagnosis not present

## 2022-02-06 LAB — CBC WITH DIFFERENTIAL/PLATELET
Abs Immature Granulocytes: 0.1 10*3/uL — ABNORMAL HIGH (ref 0.00–0.07)
Basophils Absolute: 0 10*3/uL (ref 0.0–0.1)
Basophils Relative: 0 %
Eosinophils Absolute: 0.4 10*3/uL (ref 0.0–0.5)
Eosinophils Relative: 3 %
HCT: 31.3 % — ABNORMAL LOW (ref 39.0–52.0)
Hemoglobin: 9.9 g/dL — ABNORMAL LOW (ref 13.0–17.0)
Immature Granulocytes: 1 %
Lymphocytes Relative: 5 %
Lymphs Abs: 0.7 10*3/uL (ref 0.7–4.0)
MCH: 26.3 pg (ref 26.0–34.0)
MCHC: 31.6 g/dL (ref 30.0–36.0)
MCV: 83 fL (ref 80.0–100.0)
Monocytes Absolute: 0.9 10*3/uL (ref 0.1–1.0)
Monocytes Relative: 6 %
Neutro Abs: 12.4 10*3/uL — ABNORMAL HIGH (ref 1.7–7.7)
Neutrophils Relative %: 85 %
Platelets: 364 10*3/uL (ref 150–400)
RBC: 3.77 MIL/uL — ABNORMAL LOW (ref 4.22–5.81)
RDW: 19.9 % — ABNORMAL HIGH (ref 11.5–15.5)
WBC: 14.5 10*3/uL — ABNORMAL HIGH (ref 4.0–10.5)
nRBC: 0 % (ref 0.0–0.2)

## 2022-02-06 LAB — BASIC METABOLIC PANEL
Anion gap: 5 (ref 5–15)
BUN: 24 mg/dL — ABNORMAL HIGH (ref 8–23)
CO2: 33 mmol/L — ABNORMAL HIGH (ref 22–32)
Calcium: 8.4 mg/dL — ABNORMAL LOW (ref 8.9–10.3)
Chloride: 96 mmol/L — ABNORMAL LOW (ref 98–111)
Creatinine, Ser: <0.3 mg/dL — ABNORMAL LOW (ref 0.61–1.24)
Glucose, Bld: 145 mg/dL — ABNORMAL HIGH (ref 70–99)
Potassium: 4.2 mmol/L (ref 3.5–5.1)
Sodium: 134 mmol/L — ABNORMAL LOW (ref 135–145)

## 2022-02-06 LAB — GLUCOSE, CAPILLARY
Glucose-Capillary: 128 mg/dL — ABNORMAL HIGH (ref 70–99)
Glucose-Capillary: 136 mg/dL — ABNORMAL HIGH (ref 70–99)
Glucose-Capillary: 140 mg/dL — ABNORMAL HIGH (ref 70–99)
Glucose-Capillary: 143 mg/dL — ABNORMAL HIGH (ref 70–99)
Glucose-Capillary: 147 mg/dL — ABNORMAL HIGH (ref 70–99)
Glucose-Capillary: 173 mg/dL — ABNORMAL HIGH (ref 70–99)

## 2022-02-06 LAB — CULTURE, BLOOD (SINGLE)
Culture: NO GROWTH
Special Requests: ADEQUATE

## 2022-02-06 LAB — CULTURE, BLOOD (ROUTINE X 2)
Culture: NO GROWTH
Special Requests: ADEQUATE

## 2022-02-06 LAB — MAGNESIUM: Magnesium: 2 mg/dL (ref 1.7–2.4)

## 2022-02-06 LAB — PHOSPHORUS: Phosphorus: 1.8 mg/dL — ABNORMAL LOW (ref 2.5–4.6)

## 2022-02-06 MED ORDER — METOPROLOL TARTRATE 5 MG/5ML IV SOLN: 5.0000 mg | Freq: Once | INTRAVENOUS | Status: AC

## 2022-02-06 MED ORDER — METOPROLOL TARTRATE 5 MG/5ML IV SOLN
INTRAVENOUS | Status: AC
  Administered 2022-02-06: 5 mg via INTRAVENOUS
  Filled 2022-02-06: qty 5

## 2022-02-06 MED ORDER — SODIUM PHOSPHATES 45 MMOLE/15ML IV SOLN
15.0000 mmol | Freq: Once | INTRAVENOUS | Status: AC
  Administered 2022-02-06: 15 mmol via INTRAVENOUS
  Filled 2022-02-06: qty 5

## 2022-02-06 MED ORDER — METOPROLOL TARTRATE 12.5 MG HALF TABLET
12.5000 mg | ORAL_TABLET | Freq: Three times a day (TID) | ORAL | Status: DC
  Administered 2022-02-06 – 2022-02-07 (×3): 12.5 mg
  Filled 2022-02-06 (×3): qty 1

## 2022-02-06 NOTE — Discharge Summary (Signed)
Physician Discharge Summary  Patient ID: Austin Davis MRN: [id number] DOB/AGE: [birth year] 82 y.o.  Admit date: 01/31/2022 Discharge date: 02/07/2022  Problem List Principal Problem:   Seizure [facility]) Active Problems:   Tracheostomy dependence (HCC)   HCAP (healthcare-associated pneumonia)   Multiple drug resistant organism (MDRO) culture positive  HPI: 82 yo M well known to our service who is a SNF resident with chronic respiratory failure with trach/vent dependence, Hx seizure, Parkinsons, Afib not on AC, GIB, hx MDROs, ESBL, VRE, who presents to [facility] from his SNF not infrequently, was presented late 1/12 and was admitted 1/13 overnight for acute on chronic respiratory failure and multifocal PNA, concerning for sepsis. With his history of MDRO, VRE ESBL, he was started on linezolid and mero 1/12.   Hospital Course: 1/12 to ED from [facility] with increased vent settings/resp distress. Bcx, Ucx sent. On mero and linezolid given history of MDROs.   1/13 admitted to [facility] for sepsis, acute on chronic hypoxic respiratory failure, multifocal PNA, suspected UTI. Tracheal aspirate sent 1/14 Final day of linezolid given prelim culture data. Improved vent settings.   1/16 Ucx finalized with proteus mirabilis and ESBL klebsiella pneumoniae -- sensitive to [facility] 1/18 Tracheal aspirate finalized with E Coli, pseudomonas aeruginosa, and enterobacter aerogenes -- also sensitive to mero. Briefly went into Afib, then ST. Managed with low dose metop.  1/19 remains stable and is appropriate for discharge to [facility]. While still admitted he continues to receive his Meropenem and is also receiving some sodium-phos   Exam at discharge:  General: Chronically ill frail elderly M trach/vent NAD  HEENT: NCAT trach secure Pulm: Mechanically ventilated, symmetrical chest expansion  CV: rrr cap refill < 3 sec  BI:FZLM thin nontender  GU: yellow urine  MSK: no acute joint deformity. Decreased muscle mass  Skin:  clean dry   Plan at discharge:   Chronic hypoxic respiratory failure Trach/vent dependence Sepsis due to: HCAP with pseudomonas aeruginosa, Ecoli and enterobacter aerogenes  UTI with proteus mirabilis and ESBL klebsiella pneumoniae  pAFib  Chronic HFrEF Chronic hypotension  Anemia of chronic disease Hypothyroidism  Seizure disorder Anxiety disorder Tremor/Parkinsons disease  Severe malnutrition Hypophosphatemia  Failure to thrive in adult  Leaking PEG P -complete 7day course of mero -- 1g q8hrs. Received first dose late 1/12. End date should be after 1/19   -VAP, pulm hygiene -PEG no longer leaking, cont EN -cont midodrine -PRN CBC, BMP -replace electrolytes as needed  -cont synthroid -Keppra 1g BID, VPA 250mg  BID   -Zoloft, TID xanax  -no AC given hx GIB   Labs at discharge Lab Results  Component Value Date   CREATININE <0.30 (L) 02/07/2022   BUN 26 (H) 02/07/2022   NA 137 02/07/2022   K 4.5 02/07/2022   CL 97 (L) 02/07/2022   CO2 34 (H) 02/07/2022   Lab Results  Component Value Date   WBC 14.5 (H) 02/06/2022   HGB 9.9 (L) 02/06/2022   HCT 31.3 (L) 02/06/2022   MCV 83.0 02/06/2022   PLT 364 02/06/2022   Lab Results  Component Value Date   ALT 34 02/02/2022   AST 23 02/02/2022   ALKPHOS 126 02/02/2022   BILITOT 0.5 02/02/2022   Lab Results  Component Value Date   INR 1.3 (H) 01/04/2022   INR 1.2 10/26/2021   INR 1.0 05/08/2021    Current radiology studies No results found.  Disposition: [facility]   Discharge disposition: 03-Skilled Nursing Facility      Discharge Instructions  Home infusion instructions   Complete by: As directed    Instructions: Flushing of vascular access device: 0.9% NaCl pre/post medication administration and prn patency; Heparin 100 u/ml, 35ml for implanted ports and Heparin 10u/ml, 44ml for all other central venous catheters.      Allergies as of 02/07/2022       Reactions   Prednisone Other (See Comments)    Listed as allergy on MAR Unknown reaction   Cortisone Other (See Comments)   Listed as allergy per MAR Unknown reaction        Medication List     TAKE these medications    acetaminophen 325 MG tablet Commonly known as: TYLENOL Take 650 mg by mouth every 8 (eight) hours as needed for mild pain or fever.   ALPRAZolam 0.5 MG tablet Commonly known as: XANAX Place 0.5 mg into feeding tube every 8 (eight) hours as needed for anxiety.   CULTURELLE PO Place 10 Billion Cells into feeding tube daily.   DIALYVITE TABLET Tabs Place 1 tablet into feeding tube daily.   esomeprazole 40 MG capsule Commonly known as: NEXIUM Take 40 mg by mouth daily. Via feeding tube   feeding supplement (OSMOLITE 1.5 CAL) Liqd Place 55 mL/hr into feeding tube See admin instructions. 55 ml/hr for 22-24 hours = 1815-1980 kcal & 1520-1603 ml   Fish Oil 1000 MG Caps Take 1,000 mg by mouth daily.   fluticasone 50 MCG/ACT nasal spray Commonly known as: FLONASE Place 1 spray into both nostrils every 6 (six) hours as needed for allergies or rhinitis.   folic acid 1 MG tablet Commonly known as: FOLVITE Take 1 tablet (1 mg total) by mouth daily. What changed: how to take this   furosemide 20 MG tablet Commonly known as: LASIX Place 1 tablet (20 mg total) into feeding tube daily as needed for fluid or edema. What changed: when to take this   hydrOXYzine 50 MG tablet Commonly known as: ATARAX Place 50 mg into feeding tube every 6 (six) hours.   hydrOXYzine 25 MG tablet Commonly known as: ATARAX Take 25 mg by mouth every 6 (six) hours as needed for anxiety.   lactulose 10 GM/15ML solution Commonly known as: CHRONULAC Place 20 g into feeding tube every 12 (twelve) hours as needed (constipation).   levalbuterol 1.25 MG/3ML nebulizer solution Commonly known as: XOPENEX Take 1.25 mg by nebulization every 6 (six) hours.   levETIRAcetam 100 MG/ML solution Commonly known as: KEPPRA Place 1,000 mg  into feeding tube 2 (two) times daily.   levothyroxine 25 MCG tablet Commonly known as: SYNTHROID Place 25 mcg into feeding tube daily.   LORazepam 2 MG/ML injection Commonly known as: ATIVAN Inject 2 mg into the vein as needed (seizures).   Melatonin 3 MG Tbdp Place 6 mg into feeding tube at bedtime.   meropenem  IVPB Commonly known as: MERREM Inject 1 g into the vein every 8 (eight) hours for 3 doses. Indication:  HCAP, ESBL UTI  First Dose: No Last Day of Therapy:  1/19 Labs - Once weekly:  CBC/D and BMP, Labs - Every other week:  ESR and CRP Method of administration: Mini-Bag Plus / Gravity Method of administration may be changed at the discretion of home infusion pharmacist based upon assessment of the patient and/or caregiver's ability to self-administer the medication ordered.   meropenem 1 g in sodium chloride 0.9 % 100 mL Inject 1 g into the vein every 8 (eight) hours.   metoprolol tartrate 25 MG tablet  Commonly known as: LOPRESSOR Take 25 mg by mouth as needed (AFIB. Do not administer if low BP, systolic <60).   midodrine 10 MG tablet Commonly known as: PROAMATINE Place 1 tablet (10 mg total) into feeding tube 3 (three) times daily with meals. What changed: when to take this   polyethylene glycol 17 g packet Commonly known as: MIRALAX / GLYCOLAX Place 17 g into feeding tube every other day.   propranolol 10 MG tablet Commonly known as: INDERAL Take 10 mg by mouth daily as needed (Tremor. Hold if HR is less than 60 or SBP less than 90).   sertraline 25 MG tablet Commonly known as: ZOLOFT Place 25 mg into feeding tube daily.   sterile water for irrigation Irrigate with 100 mLs as directed See admin instructions. 100 ml H2O flush every 4 hours   tamsulosin 0.4 MG Caps capsule Commonly known as: FLOMAX Take 0.4 mg by mouth daily. Via feeding tube   valproic acid 250 MG/5ML solution Commonly known as: DEPAKENE Place 250 mg into feeding tube 2 (two) times  daily.               Home Infusion Instuctions  (From admission, onward)           Start     Ordered   02/07/22 0000  Home infusion instructions       Question:  Instructions  Answer:  Flushing of vascular access device: 0.9% NaCl pre/post medication administration and prn patency; Heparin 100 u/ml, 57ml for implanted ports and Heparin 10u/ml, 61ml for all other central venous catheters.   02/07/22 1051              Discharged Condition: fair  Time spent on discharge greater than 40 minutes.  Vital signs at Discharge. Temp:  [97.8 F (36.6 C)-99.1 F (37.3 C)] 98.1 F (36.7 C) (01/19 0804) Pulse Rate:  [66-111] 82 (01/19 1000) Resp:  [20-29] 20 (01/19 1000) BP: (84-148)/(58-99) 114/88 (01/19 1000) SpO2:  [89 %-100 %] 98 % (01/19 1000) FiO2 (%):  [40 %-60 %] 40 % (01/19 0827) Weight:  [48.6 kg] 48.6 kg (01/19 0418)   Signed:  Tessie Fass MSN, AGACNP-BC [facility] Amion for pager 02/07/2022, 10:58 AM

## 2022-02-06 NOTE — TOC Progression Note (Signed)
Transition of Care [facility]) - Progression Note    Patient Details  Name: Austin Davis MRN: [id number] Date of Birth: September 28, 1940  [facility] CM/SW Contact  Delilah Shan, LCSWA Phone Number: 02/06/2022, 11:05 AM  Clinical Narrative:     Plan for patient to return back to [facility] when medically ready for dc. CSW will continue to follow and assist with patients dc planning needs.  Expected Discharge Plan: Skilled Nursing Facility ([facility]) Barriers to Discharge: Continued Medical Work up  Expected Discharge Plan and Services In-house Referral: Clinical Social Work     Living arrangements for the past 2 months: Skilled Holiday representative (From [facility])                                       Social Determinants of Health (SDOH) Interventions SDOH Screenings   Alcohol Screen: Low Risk  (04/12/2021)  Tobacco Use: Low Risk  (01/31/2022)    Readmission Risk Interventions    06/26/2021   11:10 AM  Readmission Risk Prevention Plan  Transportation Screening Complete  Medication Review (RN Care Manager) Complete  PCP or Specialist appointment within 3-5 days of discharge Complete  HRI or Home Care Consult Complete  SW Recovery Care/Counseling Consult Complete  Palliative Care Screening Not Applicable  Skilled Nursing Facility Complete

## 2022-02-06 NOTE — Progress Notes (Addendum)
NAME:  Austin Davis, MRN:  [id number], DOB:  09/15/1940, LOS: 5 ADMISSION DATE:  01/31/2022, CONSULTATION DATE: 02/01/2021 REFERRING MD: Yvonne Kendall pa, CHIEF COMPLAINT: Pneumonia  History of Present Illness:  This is a 82 year old gentleman past medical history of chronic respiratory failure, vent dependence, tracheostomy tube in place, history of seizure, Parkinson disease, A-fib not on AC.  Presents from [facility] with increased respiratory distress requiring vent support, bilateral infiltrates on chest x-ray concerning for pneumonia.  Previous culture results have reviewed revealed VRE and ESBL organisms.  He presents today with an elevated white count concern for sepsis.  Pertinent  Medical History   Past Medical History:  Diagnosis Date   CHF (congestive heart failure) ([place])    Coronary artery disease    Essential tremor    GI bleed 2019   hospitalized at [facility] for one week   Headache    since childhood   Low blood sugar    since childhood, controlled by diet   Mitral regurgitation    Mitral valve prolapse    Osteopenia 2021   Paroxysmal atrial fibrillation ([place])    Tremor      Significant Hospital Events: Including procedures, antibiotic start and stop dates in addition to other pertinent events   1/13 admitted with sepsis, started on mero linezolid  1/14 trach asp with moderate GNRs and rare yeast  1/18 Ecoli, PsA, Enterobacter PNA + proteus mirabilis and klebsiella UTI -- all sensitive to mero  Interim History / Subjective:  NAEO   Objective   Blood pressure 111/83, pulse (!) 114, temperature 97.7 F (36.5 C), temperature source Axillary, resp. rate (!) 27, height 5\' 10"  (1.778 m), weight 48 kg, SpO2 92 %.    Vent Mode: PCV FiO2 (%):  [40 %] 40 % Set Rate:  [24 bmp] 24 bmp PEEP:  [5 cmH20] 5 cmH20 Plateau Pressure:  [22 cmH20-26 cmH20] 26 cmH20   Intake/Output Summary (Last 24 hours) at 02/06/2022 0719 Last data filed at 02/06/2022 0700 Gross  per 24 hour  Intake 2986.42 ml  Output 3350 ml  Net -363.58 ml   Filed Weights   02/04/22 0428 02/05/22 0430 02/06/22 0326  Weight: 44.6 kg 48.7 kg 48 kg    Examination:  General: Chronically ill frail elderly M trach/vent  HEENT: NCAT. Pink dry mm trach secure  Neuro: Tremor. Following commands  Chest: Mechanically ventilated. Coarse lung sounds but no rhonchi Heart: tachycardic, regular.  Abdomen: soft thin ndnt  Skin: pale clean dry    Resolved Hospital Problem list   Lactic acidosis   Assessment & Plan:   Sepsis due to: Bilateral multifocal HCAP w pseudomonas aeruginosa, enterobacter aerogenes and E. Coli UTI with proteus mirabilis and ESBL klebsiella  Hx MDRO, VRE, ESBL P -7d course mero   Acute on chronic hypoxic respiratory failure Trach/vent dependence  P -VAP, pulm hygiene -routine trach care -wean MV support as able   pAF -- now sinus Chronic HFrEF P -not on AC w hx GIB -cardiac monitoring -goal euvolemic   Chronic hypotension -midodrine  Anemia of chronic disease  -PRN CBC  Hypothyroidism  -Synthroid  Parkinsons disease  Seizure disorder Anxiety disorder - Keppra and valproic acid -Not on treatment for Parkinson's disease - alprazolam and sertraline  Malnutrition Leaking PEG tube, improved  - EN   Goals of care -frequent hospitalizations for this frail deconditioned VDRF pt with hx of MDRO VRE ESBL, who resides at [facility] when well enough to be discharged from hospital.  -  in d/w pt daughter 1/17 sounds like full code full scope desired -As of 1/18 think that Austin Davis is nearing readiness return to [facility] from clinical standpoint, needs 7d course of mero. Keeping in hospital 1/18 as he had a brief episode of Afib, spontaneously back to ST, and associated desat requiring sxn     Best Practice (right click and "Reselect all SmartList Selections" daily)   Diet/type: NPO tube feeds DVT prophylaxis: Subcu Lovenox GI prophylaxis:  H2B Lines: N/A Foley:  N/A Code Status:  full code Last date of multidisciplinary goals of care discussion [1/17: Continue full scope of care per discussion with patient's daughter]  Labs   CBC: Recent Labs  Lab 02/01/22 0004 02/01/22 0012 02/01/22 0013 02/01/22 0535 02/02/22 0048 02/03/22 0611 02/06/22 0400  WBC 26.4*  --   --  22.5* 25.4* 17.7* 14.5*  NEUTROABS 25.3*  --   --   --   --   --  12.4*  HGB 10.4*   < > 11.9* 8.5* 9.7* 8.5* 9.9*  HCT 35.5*   < > 35.0* 28.3* 31.3* 27.7* 31.3*  MCV 88.8  --   --  87.9 85.5 85.0 83.0  PLT 457*  --   --  370 367 368 364   < > = values in this interval not displayed.    Basic Metabolic Panel: Recent Labs  Lab 02/01/22 0535 02/02/22 0048 02/02/22 1641 02/03/22 0611 02/03/22 1651 02/03/22 1936 02/04/22 0610 02/04/22 1645 02/05/22 0652 02/06/22 0400  NA 137 137  --  137  --   --  137  --   --  134*  K 4.2 3.5  --  4.3  --   --  4.2  --   --  4.2  CL 95* 95*  --  93*  --   --  100  --   --  96*  CO2 34* 34*  --  37*  --   --  26  --   --  33*  GLUCOSE 102* 144*  --  166*  --   --  96  --   --  145*  BUN 25* 19  --  17  --   --  27*  --   --  24*  CREATININE 0.42* 0.41*  --  0.43*  --   --  0.36*  --   --  <0.30*  CALCIUM 8.4* 8.9  --  8.2*  --   --  8.4*  --   --  8.4*  MG 1.7 1.7 2.0 1.9 2.2  --   --   --   --  2.0  PHOS 2.5 2.3* 1.8* 1.5*  --  2.6 2.5 2.3* 2.2* 1.8*   GFR: CrCl cannot be calculated (This lab value cannot be used to calculate CrCl because it is not a number: <0.30). Recent Labs  Lab 02/01/22 0004 02/01/22 0535 02/01/22 0701 02/01/22 1318 02/02/22 0048 02/03/22 0611 02/06/22 0400  PROCALCITON  --   --   --  0.39 0.19 0.21  --   WBC 26.4* 22.5*  --   --  25.4* 17.7* 14.5*  LATICACIDVEN 3.2*  --  2.0* 1.5  --   --   --     Liver Function Tests: Recent Labs  Lab 02/01/22 0004 02/01/22 0535 02/02/22 0048  AST 24 21 23   ALT 39 33 34  ALKPHOS 120 108 126  BILITOT 0.4 0.3 0.5  PROT 7.6 6.3* 7.2   ALBUMIN 2.0*  1.6* 1.9*   No results for input(s): "LIPASE", "AMYLASE" in the last 168 hours. Recent Labs  Lab 02/01/22 0535  AMMONIA 35    ABG    Component Value Date/Time   PHART 7.564 (H) 01/05/2022 0051   PCO2ART 31.7 (L) 01/05/2022 0051   PO2ART 122 (H) 01/05/2022 0051   HCO3 39.3 (H) 02/01/2022 0013   TCO2 41 (H) 02/01/2022 0013   ACIDBASEDEF 2.0 05/30/2021 0052   O2SAT 41 02/01/2022 0013     Coagulation Profile: No results for input(s): "INR", "PROTIME" in the last 168 hours.  Cardiac Enzymes: No results for input(s): "CKTOTAL", "CKMB", "CKMBINDEX", "TROPONINI" in the last 168 hours.  HbA1C: Hgb A1c MFr Bld  Date/Time Value Ref Range Status  01/04/2022 11:22 PM 5.3 4.8 - 5.6 % Final    Comment:    (NOTE)         Prediabetes: 5.7 - 6.4         Diabetes: >6.4         Glycemic control for adults with diabetes: <7.0   04/11/2021 09:12 PM 5.5 4.8 - 5.6 % Final    Comment:    (NOTE) Pre diabetes:          5.7%-6.4%  Diabetes:              >6.4%  Glycemic control for   <7.0% adults with diabetes     CBG: Recent Labs  Lab 02/05/22 1137 02/05/22 1525 02/05/22 2014 02/05/22 2324 02/06/22 0318  GLUCAP 139* 144* 142* 99 128*    CCT: n/a   Tessie Fass MSN, AGACNP-BC [place] [facility] Amion for pager  02/06/2022, 10:28 AM

## 2022-02-07 DIAGNOSIS — Z930 Tracheostomy status: Secondary | ICD-10-CM | POA: Diagnosis not present

## 2022-02-07 DIAGNOSIS — J189 Pneumonia, unspecified organism: Secondary | ICD-10-CM | POA: Diagnosis not present

## 2022-02-07 DIAGNOSIS — Z1624 Resistance to multiple antibiotics: Secondary | ICD-10-CM | POA: Diagnosis not present

## 2022-02-07 LAB — BASIC METABOLIC PANEL
Anion gap: 6 (ref 5–15)
BUN: 26 mg/dL — ABNORMAL HIGH (ref 8–23)
CO2: 34 mmol/L — ABNORMAL HIGH (ref 22–32)
Calcium: 8.7 mg/dL — ABNORMAL LOW (ref 8.9–10.3)
Chloride: 97 mmol/L — ABNORMAL LOW (ref 98–111)
Creatinine, Ser: <0.3 mg/dL — ABNORMAL LOW (ref 0.61–1.24)
Glucose, Bld: 92 mg/dL (ref 70–99)
Potassium: 4.5 mmol/L (ref 3.5–5.1)
Sodium: 137 mmol/L (ref 135–145)

## 2022-02-07 LAB — PHOSPHORUS: Phosphorus: 1.8 mg/dL — ABNORMAL LOW (ref 2.5–4.6)

## 2022-02-07 LAB — GLUCOSE, CAPILLARY
Glucose-Capillary: 107 mg/dL — ABNORMAL HIGH (ref 70–99)
Glucose-Capillary: 145 mg/dL — ABNORMAL HIGH (ref 70–99)
Glucose-Capillary: 119 mg/dL — ABNORMAL HIGH (ref 70–99)

## 2022-02-07 LAB — MAGNESIUM: Magnesium: 2.1 mg/dL (ref 1.7–2.4)

## 2022-02-07 MED ORDER — SODIUM CHLORIDE 0.9 % IV SOLN: 1.0000 g | Freq: Three times a day (TID) | INTRAVENOUS | 0 refills | Status: DC

## 2022-02-07 MED ORDER — MEROPENEM IV (FOR PTA / DISCHARGE USE ONLY): 1.0000 g | Freq: Three times a day (TID) | INTRAVENOUS | 0 refills | Status: AC

## 2022-02-07 MED ORDER — SODIUM PHOSPHATES 45 MMOLE/15ML IV SOLN
30.0000 mmol | Freq: Once | INTRAVENOUS | Status: AC
  Administered 2022-02-07: 30 mmol via INTRAVENOUS
  Filled 2022-02-07: qty 10

## 2022-02-07 NOTE — TOC Transition Note (Signed)
Transition of Care [facility]) - CM/SW Discharge Note   Patient Details  Name: Austin Davis MRN: [id number] Date of Birth: 12-20-1940  Transition of Care [facility]) CM/SW Contact:  Delilah Shan, LCSWA Phone Number: 02/07/2022, 11:19 AM   Clinical Narrative:     Patient will DC to: [facility]   Anticipated DC date: 02/06/2022  Family notified: [name]   Transport by: Melburn Hake   ?  Per MD patient ready for DC to [facility] . RN, patient, patient's family, and facility notified of DC. Discharge Summary sent to facility. RN given number for report tele# [phone] RM#[phone].Receiving MD Dr. Kristeen Miss. DC packet on chart. Ambulance transport requested for patient.  CSW signing off.    Final next level of care: Skilled Nursing Facility ([facility]) Barriers to Discharge: No Barriers Identified   Patient Goals and CMS Choice CMS Medicare.gov Compare Post Acute Care list provided to:: Patient Represenative (must comment) (Patients daughter) Choice offered to / list presented to : Adult Children (daughter [name])  Discharge Placement                Patient chooses bed at:  ([facility]) Patient to be transferred to facility by: [facility] Name of family member notified: [name] Patient and family notified of of transfer: 02/07/22  Discharge Plan and Services Additional resources added to the After Visit Summary for   In-house Referral: Clinical Social Work                                   Social Determinants of Health (SDOH) Interventions SDOH Screenings   Alcohol Screen: Low Risk  (04/12/2021)  Tobacco Use: Low Risk  (01/31/2022)     Readmission Risk Interventions    06/26/2021   11:10 AM  Readmission Risk Prevention Plan  Transportation Screening Complete  Medication Review Oceanographer) Complete  PCP or Specialist appointment within 3-5 days of discharge Complete  HRI or Home Care Consult Complete  SW Recovery Care/Counseling Consult Complete   Palliative Care Screening Not Applicable  Skilled Nursing Facility Complete

## 2022-02-07 NOTE — Progress Notes (Signed)
NAME:  Austin Davis, MRN:  [id number], DOB:  1940-10-09, LOS: 6 ADMISSION DATE:  01/31/2022, CONSULTATION DATE: 02/01/2021 REFERRING MD: Yvonne Kendall pa, CHIEF COMPLAINT: Pneumonia  History of Present Illness:  This is a 82 year old gentleman past medical history of chronic respiratory failure, vent dependence, tracheostomy tube in place, history of seizure, Parkinson disease, A-fib not on AC.  Presents from [facility] with increased respiratory distress requiring vent support, bilateral infiltrates on chest x-ray concerning for pneumonia.  Previous culture results have reviewed revealed VRE and ESBL organisms.  He presents today with an elevated white count concern for sepsis.  Pertinent  Medical History   Past Medical History:  Diagnosis Date   CHF (congestive heart failure) ([place])    Coronary artery disease    Essential tremor    GI bleed 2019   hospitalized at [facility] for one week   Headache    since childhood   Low blood sugar    since childhood, controlled by diet   Mitral regurgitation    Mitral valve prolapse    Osteopenia 2021   Paroxysmal atrial fibrillation ([place])    Tremor      Significant Hospital Events: Including procedures, antibiotic start and stop dates in addition to other pertinent events   1/13 admitted with sepsis, started on mero linezolid  1/14 trach asp with moderate GNRs and rare yeast  1/18 Ecoli, PsA, Enterobacter PNA + proteus mirabilis and klebsiella UTI -- all sensitive to mero. Stayed in ICU after a brief sun of Afib --> ST requiring a low dose metop 1/19 talking to [facility] re placement   Interim History / Subjective:  NAEO   Objective   Blood pressure 110/70, pulse 82, temperature 98.1 F (36.7 C), temperature source Oral, resp. rate (!) 24, height 5\' 10"  (1.778 m), weight 48.6 kg, SpO2 97 %.    Vent Mode: PCV FiO2 (%):  [40 %-60 %] 40 % Set Rate:  [24 bmp] 24 bmp PEEP:  [5 cmH20-10 cmH20] 8 cmH20 Plateau Pressure:  [26 cmH20-33  cmH20] 30 cmH20   Intake/Output Summary (Last 24 hours) at 02/07/2022 1014 Last data filed at 02/07/2022 0830 Gross per 24 hour  Intake 2178.27 ml  Output 1250 ml  Net 928.27 ml   Filed Weights   02/05/22 0430 02/06/22 0326 02/07/22 0418  Weight: 48.7 kg 48 kg 48.6 kg    Examination:  General: Chronically ill frail elderly M trach/vent NAD  HEENT: Temporal muscle wasting. Trach secure, thick secretions  Neuro: Awake, tremor.  Chest: Mechanically ventilated  Heart: rrr  Abdomen: thin soft ndnt, PEG  Skin: pale clean dry scattered ecchmyosis    Resolved Hospital Problem list   Lactic acidosis   Assessment & Plan:   Sepsis due to   HCAP w pseudomonas aeruginosa, enterobacter aerogenes and E. Coli UTI w proteus mirabilis and ESBL klebsiella  Hx MDRO VRE ESBL  P -7d course mero   Acute on chronic hypoxic respiratory failure Trach/vent dependence  P -VAP, pulm hygiene -routine trach care -has been back on base vent settings   pAF -- now sinus Chronic HFrEF -had a transient run of Afib then ST 1/18, spontaneous conversion from Fib to ST, then got low dose metop  P -not on AC w hx GIB -cardiac monitoring  Chronic hypotension -midodrine   Anemia of chronic disease  -PRN CBC  Hypothyroidism  -Synthroid  Parkinsons dz Seizure disorder Anxiety  - Keppra, VPA  -Not on treatment for Parkinson's disease - alprazolam  and sertraline  Malnutrition Hypophosphatemia  Physical deconditioning  Leaking PEG tube, improved  - EN  -will give phos again 1/19  Goals of care -frequent hospitalizations for this frail deconditioned VDRF pt with hx of MDRO VRE ESBL, who resides at [facility] when well enough to be discharged from hospital.  -in d/w pt daughter 1/17 sounds like full code full scope desired -1/19 clinically appropriate for return to [facility]. TOC to reach out.      Best Practice (right click and "Reselect all SmartList Selections" daily)   Diet/type: NPO  tube feeds DVT prophylaxis: Subcu Lovenox GI prophylaxis: H2B Lines: N/A Foley:  N/A Code Status:  full code Last date of multidisciplinary goals of care discussion [1/17: Continue full scope of care per discussion with patient's daughter]  Labs   CBC: Recent Labs  Lab 02/01/22 0004 02/01/22 0012 02/01/22 0013 02/01/22 0535 02/02/22 0048 02/03/22 0611 02/06/22 0400  WBC 26.4*  --   --  22.5* 25.4* 17.7* 14.5*  NEUTROABS 25.3*  --   --   --   --   --  12.4*  HGB 10.4*   < > 11.9* 8.5* 9.7* 8.5* 9.9*  HCT 35.5*   < > 35.0* 28.3* 31.3* 27.7* 31.3*  MCV 88.8  --   --  87.9 85.5 85.0 83.0  PLT 457*  --   --  370 367 368 364   < > = values in this interval not displayed.    Basic Metabolic Panel: Recent Labs  Lab 02/02/22 0048 02/02/22 1641 02/03/22 0611 02/03/22 1651 02/03/22 1936 02/04/22 0610 02/04/22 1645 02/05/22 0652 02/06/22 0400 02/07/22 0309  NA 137  --  137  --   --  137  --   --  134* 137  K 3.5  --  4.3  --   --  4.2  --   --  4.2 4.5  CL 95*  --  93*  --   --  100  --   --  96* 97*  CO2 34*  --  37*  --   --  26  --   --  33* 34*  GLUCOSE 144*  --  166*  --   --  96  --   --  145* 92  BUN 19  --  17  --   --  27*  --   --  24* 26*  CREATININE 0.41*  --  0.43*  --   --  0.36*  --   --  <0.30* <0.30*  CALCIUM 8.9  --  8.2*  --   --  8.4*  --   --  8.4* 8.7*  MG 1.7 2.0 1.9 2.2  --   --   --   --  2.0 2.1  PHOS 2.3* 1.8* 1.5*  --    < > 2.5 2.3* 2.2* 1.8* 1.8*   < > = values in this interval not displayed.   GFR: CrCl cannot be calculated (This lab value cannot be used to calculate CrCl because it is not a number: <0.30). Recent Labs  Lab 02/01/22 0004 02/01/22 0535 02/01/22 0701 02/01/22 1318 02/02/22 0048 02/03/22 0611 02/06/22 0400  PROCALCITON  --   --   --  0.39 0.19 0.21  --   WBC 26.4* 22.5*  --   --  25.4* 17.7* 14.5*  LATICACIDVEN 3.2*  --  2.0* 1.5  --   --   --     Liver Function Tests: Recent Labs  Lab 02/01/22 0004  02/01/22 0535 02/02/22 0048  AST 24 21 23   ALT 39 33 34  ALKPHOS 120 108 126  BILITOT 0.4 0.3 0.5  PROT 7.6 6.3* 7.2  ALBUMIN 2.0* 1.6* 1.9*   No results for input(s): "LIPASE", "AMYLASE" in the last 168 hours. Recent Labs  Lab 02/01/22 0535  AMMONIA 35    ABG    Component Value Date/Time   PHART 7.564 (H) 01/05/2022 0051   PCO2ART 31.7 (L) 01/05/2022 0051   PO2ART 122 (H) 01/05/2022 0051   HCO3 39.3 (H) 02/01/2022 0013   TCO2 41 (H) 02/01/2022 0013   ACIDBASEDEF 2.0 05/30/2021 0052   O2SAT 41 02/01/2022 0013     Coagulation Profile: No results for input(s): "INR", "PROTIME" in the last 168 hours.  Cardiac Enzymes: No results for input(s): "CKTOTAL", "CKMB", "CKMBINDEX", "TROPONINI" in the last 168 hours.  HbA1C: Hgb A1c MFr Bld  Date/Time Value Ref Range Status  01/04/2022 11:22 PM 5.3 4.8 - 5.6 % Final    Comment:    (NOTE)         Prediabetes: 5.7 - 6.4         Diabetes: >6.4         Glycemic control for adults with diabetes: <7.0   04/11/2021 09:12 PM 5.5 4.8 - 5.6 % Final    Comment:    (NOTE) Pre diabetes:          5.7%-6.4%  Diabetes:              >6.4%  Glycemic control for   <7.0% adults with diabetes     CBG: Recent Labs  Lab 02/06/22 1520 02/06/22 2000 02/06/22 2343 02/07/22 0259 02/07/22 0802  GLUCAP 147* 136* 140* 107* 145*    CCT: n/a   02/09/22 MSN, AGACNP-BC [address] Pulmonary/Critical Care Medicine Amion for pager 02/07/2022, 10:14 AM

## 2022-02-07 NOTE — Progress Notes (Signed)
Pharmacy Antibiotic Note  Austin Davis is a 82 y.o. male admitted on 01/31/2022 with pneumonia and hx VRE and ESBL. Pharmacy has been consulted for Merrem dosing. Plans are for 7 days total -WBC= 14.5, afeb, SCr < 0.3 -respiratory cultures with pseudomonas, enterobacter aerogenes -urine cultures with proteus mirabilis (sensitive to imipenem) and ESBL klebsiella pneumonia   Plan: -Last day of Meropenem is 1/19   Height: 5\' 10"  (177.8 cm) Weight: 48.6 kg (107 lb 2.3 oz) IBW/kg (Calculated) : 73  Temp (24hrs), Avg:98.3 F (36.8 C), Min:97.8 F (36.6 C), Max:99.1 F (37.3 C)  Recent Labs  Lab 02/01/22 0004 02/01/22 0012 02/01/22 0535 02/01/22 0701 02/01/22 1318 02/02/22 0048 02/03/22 0611 02/04/22 0610 02/06/22 0400 02/07/22 0309  WBC 26.4*  --  22.5*  --   --  25.4* 17.7*  --  14.5*  --   CREATININE 0.47*   < > 0.42*  --   --  0.41* 0.43* 0.36* <0.30* <0.30*  LATICACIDVEN 3.2*  --   --  2.0* 1.5  --   --   --   --   --    < > = values in this interval not displayed.     CrCl cannot be calculated (This lab value cannot be used to calculate CrCl because it is not a number: <0.30).    Allergies  Allergen Reactions   Prednisone Other (See Comments)    Listed as allergy on MAR Unknown reaction   Cortisone Other (See Comments)    Listed as allergy per MAR Unknown reaction    02/09/22, PharmD Clinical Pharmacist **Pharmacist phone directory can now be found on [email or web address] ([email or web address]).  Listed under [facility].

## 2022-02-07 NOTE — Consult Note (Signed)
   [facility] CM Inpatient Consult   02/07/2022  ALEGANDRO MACNAUGHTON Apr 18, 1940 [id number]  [facility] [[facility]]  Accountable Care Organization [ACO] Patient: Medicare ACO REACH  Primary Care Provider:  Patrecia Pour, MD  *Acknowledgement of readmission  *Extreme high risk for unplanned readmission and less than for less than 30 day readmission list, patient is currently ICU level of care as patient is to transition back to Long Term SNF at [facility] as patient is long-term trach vent dependent.  Plan: No [facility] Care Coordination is needed as patient is long term care SNF.  For questions or referrals, please contact:   Charlesetta Shanks, RN BSN CCM [facility]  [phone] business mobile phone Toll free office [phone]  *Concierge Line  [phone] Fax number: [phone] [email or web address]

## 2022-02-08 DIAGNOSIS — J9621 Acute and chronic respiratory failure with hypoxia: Secondary | ICD-10-CM | POA: Diagnosis not present

## 2022-02-08 DIAGNOSIS — Z9911 Dependence on respirator [ventilator] status: Secondary | ICD-10-CM | POA: Diagnosis not present

## 2022-02-09 LAB — MIC RESULTS (2 DRUGS)

## 2022-02-09 LAB — MIC RESULT

## 2022-02-10 DIAGNOSIS — J9611 Chronic respiratory failure with hypoxia: Secondary | ICD-10-CM | POA: Diagnosis not present

## 2022-02-10 DIAGNOSIS — G40909 Epilepsy, unspecified, not intractable, without status epilepticus: Secondary | ICD-10-CM | POA: Diagnosis not present

## 2022-02-10 DIAGNOSIS — R627 Adult failure to thrive: Secondary | ICD-10-CM | POA: Diagnosis not present

## 2022-02-10 DIAGNOSIS — Z430 Encounter for attention to tracheostomy: Secondary | ICD-10-CM | POA: Diagnosis not present

## 2022-02-10 DIAGNOSIS — Z9911 Dependence on respirator [ventilator] status: Secondary | ICD-10-CM | POA: Diagnosis not present

## 2022-02-10 DIAGNOSIS — I480 Paroxysmal atrial fibrillation: Secondary | ICD-10-CM | POA: Diagnosis not present

## 2022-02-10 DIAGNOSIS — Z431 Encounter for attention to gastrostomy: Secondary | ICD-10-CM | POA: Diagnosis not present

## 2022-02-10 DIAGNOSIS — R1319 Other dysphagia: Secondary | ICD-10-CM | POA: Diagnosis not present

## 2022-02-10 LAB — MIN INHIBITORY CONC (2 DRUGS)

## 2022-02-10 LAB — MINIMUM INHIBITORY CONC. (1 DRUG)

## 2022-02-11 DIAGNOSIS — J9621 Acute and chronic respiratory failure with hypoxia: Secondary | ICD-10-CM | POA: Diagnosis not present

## 2022-02-11 DIAGNOSIS — Z9911 Dependence on respirator [ventilator] status: Secondary | ICD-10-CM | POA: Diagnosis not present

## 2022-02-17 LAB — CULTURE, RESPIRATORY W GRAM STAIN

## 2022-02-21 LAB — MISC LABCORP TEST (SEND OUT)
LabCorp test name: 4
LabCorp test name: 5
Labcorp test code: 88021
Labcorp test code: 88021

## 2022-02-26 ENCOUNTER — Emergency Department (HOSPITAL_COMMUNITY): Payer: Medicare Other

## 2022-02-26 ENCOUNTER — Inpatient Hospital Stay (HOSPITAL_COMMUNITY): Payer: Medicare Other

## 2022-02-26 ENCOUNTER — Other Ambulatory Visit: Payer: Self-pay

## 2022-02-26 ENCOUNTER — Inpatient Hospital Stay (HOSPITAL_COMMUNITY)
Admission: EM | Admit: 2022-02-26 | Discharge: 2022-03-03 | DRG: 870 | Disposition: A | Discharge status: Skilled Nursing Facility | Payer: Medicare Other | Source: Other Acute Inpatient Hospital | Attending: Pulmonary Disease | Admitting: Pulmonary Disease

## 2022-02-26 DIAGNOSIS — G20A1 Parkinson's disease without dyskinesia, without mention of fluctuations: Secondary | ICD-10-CM | POA: Diagnosis not present

## 2022-02-26 DIAGNOSIS — B952 Enterococcus as the cause of diseases classified elsewhere: Secondary | ICD-10-CM | POA: Diagnosis not present

## 2022-02-26 DIAGNOSIS — E039 Hypothyroidism, unspecified: Secondary | ICD-10-CM | POA: Diagnosis present

## 2022-02-26 DIAGNOSIS — R918 Other nonspecific abnormal finding of lung field: Secondary | ICD-10-CM | POA: Diagnosis not present

## 2022-02-26 DIAGNOSIS — Z931 Gastrostomy status: Secondary | ICD-10-CM | POA: Diagnosis not present

## 2022-02-26 DIAGNOSIS — L89151 Pressure ulcer of sacral region, stage 1: Secondary | ICD-10-CM | POA: Diagnosis present

## 2022-02-26 DIAGNOSIS — G9341 Metabolic encephalopathy: Secondary | ICD-10-CM | POA: Diagnosis present

## 2022-02-26 DIAGNOSIS — J189 Pneumonia, unspecified organism: Secondary | ICD-10-CM | POA: Diagnosis not present

## 2022-02-26 DIAGNOSIS — R4182 Altered mental status, unspecified: Secondary | ICD-10-CM | POA: Diagnosis not present

## 2022-02-26 DIAGNOSIS — Y848 Other medical procedures as the cause of abnormal reaction of the patient, or of later complication, without mention of misadventure at the time of the procedure: Secondary | ICD-10-CM | POA: Diagnosis not present

## 2022-02-26 DIAGNOSIS — Z1621 Resistance to vancomycin: Secondary | ICD-10-CM | POA: Diagnosis present

## 2022-02-26 DIAGNOSIS — I9589 Other hypotension: Secondary | ICD-10-CM | POA: Diagnosis present

## 2022-02-26 DIAGNOSIS — A419 Sepsis, unspecified organism: Principal | ICD-10-CM | POA: Diagnosis present

## 2022-02-26 DIAGNOSIS — I341 Nonrheumatic mitral (valve) prolapse: Secondary | ICD-10-CM | POA: Diagnosis present

## 2022-02-26 DIAGNOSIS — Z4682 Encounter for fitting and adjustment of non-vascular catheter: Secondary | ICD-10-CM | POA: Diagnosis not present

## 2022-02-26 DIAGNOSIS — G250 Essential tremor: Secondary | ICD-10-CM | POA: Diagnosis not present

## 2022-02-26 DIAGNOSIS — Z1612 Extended spectrum beta lactamase (ESBL) resistance: Secondary | ICD-10-CM | POA: Diagnosis present

## 2022-02-26 DIAGNOSIS — Z79890 Hormone replacement therapy (postmenopausal): Secondary | ICD-10-CM

## 2022-02-26 DIAGNOSIS — E872 Acidosis, unspecified: Secondary | ICD-10-CM | POA: Diagnosis present

## 2022-02-26 DIAGNOSIS — B9620 Unspecified Escherichia coli [E. coli] as the cause of diseases classified elsewhere: Secondary | ICD-10-CM | POA: Diagnosis not present

## 2022-02-26 DIAGNOSIS — I480 Paroxysmal atrial fibrillation: Secondary | ICD-10-CM | POA: Diagnosis not present

## 2022-02-26 DIAGNOSIS — I340 Nonrheumatic mitral (valve) insufficiency: Secondary | ICD-10-CM | POA: Diagnosis present

## 2022-02-26 DIAGNOSIS — Z79899 Other long term (current) drug therapy: Secondary | ICD-10-CM

## 2022-02-26 DIAGNOSIS — J9621 Acute and chronic respiratory failure with hypoxia: Secondary | ICD-10-CM | POA: Diagnosis present

## 2022-02-26 DIAGNOSIS — N390 Urinary tract infection, site not specified: Secondary | ICD-10-CM | POA: Diagnosis present

## 2022-02-26 DIAGNOSIS — D6489 Other specified anemias: Secondary | ICD-10-CM | POA: Diagnosis present

## 2022-02-26 DIAGNOSIS — T83511A Infection and inflammatory reaction due to indwelling urethral catheter, initial encounter: Secondary | ICD-10-CM | POA: Diagnosis present

## 2022-02-26 DIAGNOSIS — E43 Unspecified severe protein-calorie malnutrition: Secondary | ICD-10-CM | POA: Diagnosis present

## 2022-02-26 DIAGNOSIS — G40909 Epilepsy, unspecified, not intractable, without status epilepticus: Secondary | ICD-10-CM | POA: Diagnosis present

## 2022-02-26 DIAGNOSIS — Z930 Tracheostomy status: Secondary | ICD-10-CM

## 2022-02-26 DIAGNOSIS — F419 Anxiety disorder, unspecified: Secondary | ICD-10-CM | POA: Diagnosis present

## 2022-02-26 DIAGNOSIS — Y95 Nosocomial condition: Secondary | ICD-10-CM | POA: Diagnosis not present

## 2022-02-26 DIAGNOSIS — J9601 Acute respiratory failure with hypoxia: Secondary | ICD-10-CM | POA: Diagnosis not present

## 2022-02-26 DIAGNOSIS — Z681 Body mass index (BMI) 19 or less, adult: Secondary | ICD-10-CM | POA: Diagnosis not present

## 2022-02-26 DIAGNOSIS — N3000 Acute cystitis without hematuria: Secondary | ICD-10-CM | POA: Diagnosis not present

## 2022-02-26 DIAGNOSIS — Z888 Allergy status to other drugs, medicaments and biological substances status: Secondary | ICD-10-CM

## 2022-02-26 DIAGNOSIS — R6520 Severe sepsis without septic shock: Secondary | ICD-10-CM | POA: Diagnosis present

## 2022-02-26 DIAGNOSIS — R569 Unspecified convulsions: Secondary | ICD-10-CM | POA: Diagnosis not present

## 2022-02-26 DIAGNOSIS — J151 Pneumonia due to Pseudomonas: Secondary | ICD-10-CM | POA: Diagnosis present

## 2022-02-26 DIAGNOSIS — I2510 Atherosclerotic heart disease of native coronary artery without angina pectoris: Secondary | ICD-10-CM | POA: Diagnosis present

## 2022-02-26 DIAGNOSIS — Z951 Presence of aortocoronary bypass graft: Secondary | ICD-10-CM | POA: Diagnosis not present

## 2022-02-26 DIAGNOSIS — R000 Tachycardia, unspecified: Secondary | ICD-10-CM | POA: Diagnosis not present

## 2022-02-26 DIAGNOSIS — K9423 Gastrostomy malfunction: Secondary | ICD-10-CM | POA: Diagnosis not present

## 2022-02-26 DIAGNOSIS — J95851 Ventilator associated pneumonia: Secondary | ICD-10-CM | POA: Diagnosis present

## 2022-02-26 DIAGNOSIS — J984 Other disorders of lung: Secondary | ICD-10-CM | POA: Diagnosis not present

## 2022-02-26 DIAGNOSIS — Z4659 Encounter for fitting and adjustment of other gastrointestinal appliance and device: Secondary | ICD-10-CM | POA: Diagnosis not present

## 2022-02-26 DIAGNOSIS — Z8249 Family history of ischemic heart disease and other diseases of the circulatory system: Secondary | ICD-10-CM

## 2022-02-26 DIAGNOSIS — Z9911 Dependence on respirator [ventilator] status: Secondary | ICD-10-CM

## 2022-02-26 DIAGNOSIS — Z820 Family history of epilepsy and other diseases of the nervous system: Secondary | ICD-10-CM

## 2022-02-26 DIAGNOSIS — B965 Pseudomonas (aeruginosa) (mallei) (pseudomallei) as the cause of diseases classified elsewhere: Secondary | ICD-10-CM | POA: Diagnosis present

## 2022-02-26 LAB — LACTIC ACID, PLASMA
Lactic Acid, Venous: 0.9 mmol/L (ref 0.5–1.9)
Lactic Acid, Venous: 1.1 mmol/L (ref 0.5–1.9)

## 2022-02-26 LAB — CBC WITH DIFFERENTIAL/PLATELET
Abs Immature Granulocytes: 0.29 10*3/uL — ABNORMAL HIGH (ref 0.00–0.07)
Basophils Absolute: 0.1 10*3/uL (ref 0.0–0.1)
Basophils Relative: 0 %
Eosinophils Absolute: 0 10*3/uL (ref 0.0–0.5)
Eosinophils Relative: 0 %
HCT: 37.9 % — ABNORMAL LOW (ref 39.0–52.0)
Hemoglobin: 11.2 g/dL — ABNORMAL LOW (ref 13.0–17.0)
Immature Granulocytes: 1 %
Lymphocytes Relative: 1 %
Lymphs Abs: 0.3 10*3/uL — ABNORMAL LOW (ref 0.7–4.0)
MCH: 26.4 pg (ref 26.0–34.0)
MCHC: 29.6 g/dL — ABNORMAL LOW (ref 30.0–36.0)
MCV: 89.2 fL (ref 80.0–100.0)
Monocytes Absolute: 1.6 10*3/uL — ABNORMAL HIGH (ref 0.1–1.0)
Monocytes Relative: 5 %
Neutro Abs: 32.9 10*3/uL — ABNORMAL HIGH (ref 1.7–7.7)
Neutrophils Relative %: 93 %
Platelets: 495 10*3/uL — ABNORMAL HIGH (ref 150–400)
RBC: 4.25 MIL/uL (ref 4.22–5.81)
RDW: 19.7 % — ABNORMAL HIGH (ref 11.5–15.5)
WBC: 35.2 10*3/uL — ABNORMAL HIGH (ref 4.0–10.5)
nRBC: 0 % (ref 0.0–0.2)

## 2022-02-26 LAB — POCT I-STAT 7, (LYTES, BLD GAS, ICA,H+H)
Acid-Base Excess: 12 mmol/L — ABNORMAL HIGH (ref 0.0–2.0)
Bicarbonate: 40.9 mmol/L — ABNORMAL HIGH (ref 20.0–28.0)
Calcium, Ion: 1.26 mmol/L (ref 1.15–1.40)
HCT: 30 % — ABNORMAL LOW (ref 39.0–52.0)
Hemoglobin: 10.2 g/dL — ABNORMAL LOW (ref 13.0–17.0)
O2 Saturation: 87 %
Patient temperature: 36.8
Potassium: 5.1 mmol/L (ref 3.5–5.1)
Sodium: 135 mmol/L (ref 135–145)
TCO2: 43 mmol/L — ABNORMAL HIGH (ref 22–32)
pCO2 arterial: 85.5 mmHg (ref 32–48)
pH, Arterial: 7.286 — ABNORMAL LOW (ref 7.35–7.45)
pO2, Arterial: 63 mmHg — ABNORMAL LOW (ref 83–108)

## 2022-02-26 LAB — MAGNESIUM: Magnesium: 1.7 mg/dL (ref 1.7–2.4)

## 2022-02-26 LAB — CBG MONITORING, ED: Glucose-Capillary: 148 mg/dL — ABNORMAL HIGH (ref 70–99)

## 2022-02-26 LAB — GLUCOSE, CAPILLARY
Glucose-Capillary: 144 mg/dL — ABNORMAL HIGH (ref 70–99)
Glucose-Capillary: 88 mg/dL (ref 70–99)
Glucose-Capillary: 91 mg/dL (ref 70–99)

## 2022-02-26 LAB — COMPREHENSIVE METABOLIC PANEL
ALT: 41 U/L (ref 0–44)
AST: 20 U/L (ref 15–41)
Albumin: 2.3 g/dL — ABNORMAL LOW (ref 3.5–5.0)
Alkaline Phosphatase: 158 U/L — ABNORMAL HIGH (ref 38–126)
Anion gap: 7 (ref 5–15)
BUN: 30 mg/dL — ABNORMAL HIGH (ref 8–23)
CO2: 40 mmol/L — ABNORMAL HIGH (ref 22–32)
Calcium: 9.2 mg/dL (ref 8.9–10.3)
Chloride: 86 mmol/L — ABNORMAL LOW (ref 98–111)
Creatinine, Ser: 0.41 mg/dL — ABNORMAL LOW (ref 0.61–1.24)
GFR, Estimated: >60 mL/min (ref >60)
Glucose, Bld: 168 mg/dL — ABNORMAL HIGH (ref 70–99)
Potassium: 5.1 mmol/L (ref 3.5–5.1)
Sodium: 133 mmol/L — ABNORMAL LOW (ref 135–145)
Total Bilirubin: 0.2 mg/dL — ABNORMAL LOW (ref 0.3–1.2)
Total Protein: 8.2 g/dL — ABNORMAL HIGH (ref 6.5–8.1)

## 2022-02-26 LAB — PHOSPHORUS: Phosphorus: 2.5 mg/dL (ref 2.5–4.6)

## 2022-02-26 LAB — MRSA NEXT GEN BY PCR, NASAL: MRSA by PCR Next Gen: NOT DETECTED

## 2022-02-26 MED ORDER — LORAZEPAM 2 MG/ML IJ SOLN
2.0000 mg | Freq: Once | INTRAMUSCULAR | Status: AC
  Administered 2022-02-26: 2 mg via INTRAVENOUS
  Filled 2022-02-26: qty 1

## 2022-02-26 MED ORDER — ACETAMINOPHEN 325 MG PO TABS
650.0000 mg | ORAL_TABLET | Freq: Four times a day (QID) | ORAL | Status: DC | PRN
  Administered 2022-02-26 – 2022-03-02 (×7): 650 mg
  Filled 2022-02-26 (×7): qty 2

## 2022-02-26 MED ORDER — LEVOTHYROXINE SODIUM 25 MCG PO TABS
25.0000 ug | ORAL_TABLET | Freq: Every day | ORAL | Status: DC
  Administered 2022-02-26 – 2022-03-03 (×5): 25 ug
  Filled 2022-02-26 (×6): qty 1

## 2022-02-26 MED ORDER — LACTATED RINGERS IV SOLN: INTRAVENOUS | Status: AC

## 2022-02-26 MED ORDER — ALPRAZOLAM 0.5 MG PO TABS
0.5000 mg | ORAL_TABLET | Freq: Three times a day (TID) | ORAL | Status: DC | PRN
  Administered 2022-02-27 – 2022-03-01 (×3): 0.5 mg
  Filled 2022-02-26 (×4): qty 1

## 2022-02-26 MED ORDER — DOCUSATE SODIUM 100 MG PO CAPS: 100.0000 mg | ORAL_CAPSULE | Freq: Two times a day (BID) | ORAL | Status: DC | PRN

## 2022-02-26 MED ORDER — FAMOTIDINE 20 MG PO TABS
20.0000 mg | ORAL_TABLET | Freq: Two times a day (BID) | ORAL | Status: DC
  Administered 2022-02-26: 20 mg
  Filled 2022-02-26: qty 1

## 2022-02-26 MED ORDER — VALPROIC ACID 250 MG/5ML PO SOLN
250.0000 mg | Freq: Two times a day (BID) | ORAL | Status: DC
  Administered 2022-02-26 – 2022-02-28 (×5): 250 mg
  Filled 2022-02-26 (×5): qty 5

## 2022-02-26 MED ORDER — PANTOPRAZOLE SODIUM 40 MG IV SOLR
40.0000 mg | Freq: Every day | INTRAVENOUS | Status: DC
  Administered 2022-02-26 – 2022-03-02 (×5): 40 mg via INTRAVENOUS
  Filled 2022-02-26 (×5): qty 10

## 2022-02-26 MED ORDER — SODIUM CHLORIDE 0.9 % IV SOLN
2000.0000 mg | Freq: Once | INTRAVENOUS | Status: AC
  Administered 2022-02-26: 2000 mg via INTRAVENOUS
  Filled 2022-02-26: qty 20

## 2022-02-26 MED ORDER — ALBUTEROL SULFATE (2.5 MG/3ML) 0.083% IN NEBU
2.5000 mg | INHALATION_SOLUTION | RESPIRATORY_TRACT | Status: DC
  Administered 2022-02-26 (×3): 2.5 mg via RESPIRATORY_TRACT
  Filled 2022-02-26 (×3): qty 3

## 2022-02-26 MED ORDER — JUVEN PO PACK
1.0000 | PACK | Freq: Two times a day (BID) | ORAL | Status: DC
  Administered 2022-02-26 – 2022-03-03 (×11): 1
  Filled 2022-02-26 (×11): qty 1

## 2022-02-26 MED ORDER — CHLORHEXIDINE GLUCONATE CLOTH 2 % EX PADS
6.0000 | MEDICATED_PAD | Freq: Every day | CUTANEOUS | Status: DC
  Administered 2022-02-26 – 2022-03-03 (×6): 6 via TOPICAL

## 2022-02-26 MED ORDER — ORAL CARE MOUTH RINSE: 15.0000 mL | OROMUCOSAL | Status: DC | PRN

## 2022-02-26 MED ORDER — ACETAMINOPHEN 325 MG PO TABS: 650.0000 mg | ORAL_TABLET | Freq: Four times a day (QID) | ORAL | Status: DC | PRN

## 2022-02-26 MED ORDER — LINEZOLID 600 MG/300ML IV SOLN
600.0000 mg | Freq: Two times a day (BID) | INTRAVENOUS | Status: DC
  Administered 2022-02-26 – 2022-02-28 (×5): 600 mg via INTRAVENOUS
  Filled 2022-02-26 (×5): qty 300

## 2022-02-26 MED ORDER — LEVETIRACETAM 500 MG PO TABS
1000.0000 mg | ORAL_TABLET | Freq: Two times a day (BID) | ORAL | Status: DC
  Administered 2022-02-26: 1000 mg
  Filled 2022-02-26: qty 2

## 2022-02-26 MED ORDER — SODIUM CHLORIDE 0.9 % IV SOLN
1.0000 g | Freq: Three times a day (TID) | INTRAVENOUS | Status: AC
  Administered 2022-02-26 – 2022-03-02 (×15): 1 g via INTRAVENOUS
  Filled 2022-02-26 (×17): qty 20

## 2022-02-26 MED ORDER — ALBUTEROL SULFATE (2.5 MG/3ML) 0.083% IN NEBU: 2.5000 mg | INHALATION_SOLUTION | RESPIRATORY_TRACT | Status: DC | PRN

## 2022-02-26 MED ORDER — MIDODRINE HCL 5 MG PO TABS
10.0000 mg | ORAL_TABLET | Freq: Three times a day (TID) | ORAL | Status: DC
  Administered 2022-02-26 – 2022-03-03 (×17): 10 mg
  Filled 2022-02-26 (×17): qty 2

## 2022-02-26 MED ORDER — SODIUM CHLORIDE 0.9 % IV SOLN: INTRAVENOUS | Status: DC

## 2022-02-26 MED ORDER — HYDROXYZINE HCL 25 MG PO TABS
25.0000 mg | ORAL_TABLET | Freq: Four times a day (QID) | ORAL | Status: DC
  Administered 2022-02-26 – 2022-03-03 (×20): 25 mg
  Filled 2022-02-26 (×20): qty 1

## 2022-02-26 MED ORDER — LACTATED RINGERS IV BOLUS
1000.0000 mL | Freq: Once | INTRAVENOUS | Status: AC
  Administered 2022-02-26: 1000 mL via INTRAVENOUS

## 2022-02-26 MED ORDER — ADULT MULTIVITAMIN W/MINERALS CH
1.0000 | ORAL_TABLET | Freq: Every day | ORAL | Status: DC
  Administered 2022-02-26 – 2022-03-03 (×6): 1
  Filled 2022-02-26 (×6): qty 1

## 2022-02-26 MED ORDER — ORAL CARE MOUTH RINSE
15.0000 mL | OROMUCOSAL | Status: DC
  Administered 2022-02-26 – 2022-03-03 (×61): 15 mL via OROMUCOSAL

## 2022-02-26 MED ORDER — METOPROLOL TARTRATE 25 MG PO TABS: 12.5000 mg | ORAL_TABLET | Freq: Three times a day (TID) | ORAL | Status: DC

## 2022-02-26 MED ORDER — SODIUM CHLORIDE 0.9 % IV SOLN
500.0000 mg | INTRAVENOUS | Status: DC
  Administered 2022-02-26: 500 mg via INTRAVENOUS
  Filled 2022-02-26: qty 5

## 2022-02-26 MED ORDER — SODIUM CHLORIDE 0.9 % IV SOLN
2.0000 g | INTRAVENOUS | Status: DC
  Administered 2022-02-26: 2 g via INTRAVENOUS
  Filled 2022-02-26: qty 20

## 2022-02-26 MED ORDER — ALBUTEROL SULFATE (2.5 MG/3ML) 0.083% IN NEBU: 2.5000 mg | INHALATION_SOLUTION | RESPIRATORY_TRACT | Status: DC

## 2022-02-26 MED ORDER — POLYETHYLENE GLYCOL 3350 17 G PO PACK: 17.0000 g | PACK | Freq: Every day | ORAL | Status: DC | PRN

## 2022-02-26 MED ORDER — OSMOLITE 1.5 CAL PO LIQD
1000.0000 mL | ORAL | Status: DC
  Administered 2022-02-26 – 2022-03-01 (×4): 1000 mL

## 2022-02-26 NOTE — ED Triage Notes (Signed)
EMS reports staff found pt unresponsive, which is not pts normal. Pt is vent dependent and will follow commands on a normal day.

## 2022-02-26 NOTE — Progress Notes (Signed)
RT obtained ABG and reported results to Dr.Chand. ABG as follows: 7.28/85.5/63/40.9. RT increased RR form 24 to 32 per MD.

## 2022-02-26 NOTE — ED Notes (Signed)
ED TO INPATIENT HANDOFF REPORT  ED Nurse Name and Phone #: Vincente Liberty [phone]  S Name/Age/Gender Austin Davis 82 y.o. male Room/Bed: 026C/026C  Code Status   Code Status: Full Code  Home/SNF/Other Skilled nursing facility Pt is not responsive  Is this baseline?  I'm not sure SNF said it was a change   Triage Complete: Triage complete  Chief Complaint Acute hypoxic respiratory failure ([place]) [J96.01]  Triage Note EMS reports staff found pt unresponsive, which is not pts normal. Pt is vent dependent and will follow commands on a normal day.     Allergies Allergies  Allergen Reactions   Prednisone Other (See Comments)    Listed as allergy on MAR Unknown reaction   Cortisone Other (See Comments)    Listed as allergy per MAR Unknown reaction    Level of Care/Admitting Diagnosis ED Disposition     ED Disposition  Admit   Condition  --   [place]: [place] [[id number]]  Level of Care: ICU [6]  May admit patient to [facility] or [facility] if equivalent level of care is available:: No  Covid Evaluation: Asymptomatic - no recent exposure (last 10 days) testing not required  Diagnosis: Acute hypoxic respiratory failure [facility]) [[id number]]  Admitting Physician: Jacky Kindle [[id number]]  Attending Physician: Jacky Kindle [[id number]]  Certification:: I certify this patient will need inpatient services for at least 2 midnights  Estimated Length of Stay: 8          B Medical/Surgery History Past Medical History:  Diagnosis Date   CHF (congestive heart failure) ([place])    Coronary artery disease    Essential tremor    GI bleed 2019   hospitalized at [facility] for one week   Headache    since childhood   Low blood sugar    since childhood, controlled by diet   Mitral regurgitation    Mitral valve prolapse    Osteopenia 2021   Paroxysmal atrial fibrillation (HCC)    Tremor    Past Surgical History:  Procedure Laterality Date    COLONOSCOPY WITH PROPOFOL N/A 02/19/2017   Procedure: COLONOSCOPY WITH PROPOFOL;  Surgeon: Wilford Corner, MD;  Location: [facility];  Service: Endoscopy;  Laterality: N/A;   IR GASTROSTOMY TUBE MOD SED  10/18/2020   IR IVC FILTER PLMT / S&I /IMG GUID/MOD SED  11/01/2020   laser eye surgery for retina detachment     MITRAL VALVE REPAIR  01/2003   monitor  02/05/2006   polyp removal     TONSILLECTOMY     tooth removal     as a teenager   TRACHEOSTOMY TUBE PLACEMENT N/A 10/26/2020   Procedure: TRACHEOSTOMY;  Surgeon: Rozetta Nunnery, MD;  Location: [place];  Service: ENT;  Laterality: N/A;     A IV Location/Drains/Wounds Patient Lines/Drains/Airways Status     Active Line/Drains/Airways     Name Placement date Placement time Site Days   Peripheral IV 02/26/22 20 G Right Antecubital 02/26/22  0248  Antecubital  less than 1   Peripheral IV 02/26/22 20 G Right Hand 02/26/22  0249  Hand  less than 1   Peripheral IV 02/26/22 20 G Anterior;Distal;Left Forearm 02/26/22  0820  Forearm  less than 1   Gastrostomy/Enterostomy Gastrostomy 18 Fr. LUQ 10/18/20  0932  LUQ  496   Urethral Catheter WE Temperature probe 16 Fr. 02/26/22  0318  Temperature probe  less than 1   Tracheostomy Shiley XLT Distal  6 mm Cuffed;Distal 10/26/21  0750  6 mm  123   Tracheostomy 6 mm Cuffed --  --  6 mm  --   Pressure Injury 05/08/21 Sacrum Medial;Lower Stage 1 -  Intact skin with non-blanchable redness of a localized area usually over a bony prominence. 05/08/21  0041  -- 294   Pressure Injury 01/04/22 Buttocks Left;Medial Deep Tissue Pressure Injury - Purple or maroon localized area of discolored intact skin or blood-filled blister due to damage of underlying soft tissue from pressure and/or shear. 01/04/22  2200  -- 53   Pressure Injury 02/01/22 Buttocks Left Stage 2 -  Partial thickness loss of dermis presenting as a shallow open injury with a red, pink wound bed without slough. 02/01/22  0640  -- 25             Intake/Output Last 24 hours  Intake/Output Summary (Last 24 hours) at 02/26/2022 0853 Last data filed at 02/26/2022 3419 Gross per 24 hour  Intake 2720.54 ml  Output --  Net 2720.54 ml    Labs/Imaging Results for orders placed or performed during the hospital encounter of 02/26/22 (from the past 48 hour(s))  CBG monitoring, ED     Status: Abnormal   Collection Time: 02/26/22  2:57 AM  Result Value Ref Range   Glucose-Capillary 148 (H) 70 - 99 mg/dL    Comment: Glucose reference range applies only to samples taken after fasting for at least 8 hours.  CBC with Differential     Status: Abnormal   Collection Time: 02/26/22  3:00 AM  Result Value Ref Range   WBC 35.2 (H) 4.0 - 10.5 K/uL   RBC 4.25 4.22 - 5.81 MIL/uL   Hemoglobin 11.2 (L) 13.0 - 17.0 g/dL   HCT 37.9 (L) 39.0 - 52.0 %   MCV 89.2 80.0 - 100.0 fL   MCH 26.4 26.0 - 34.0 pg   MCHC 29.6 (L) 30.0 - 36.0 g/dL   RDW 19.7 (H) 11.5 - 15.5 %   Platelets 495 (H) 150 - 400 K/uL   nRBC 0.0 0.0 - 0.2 %   Neutrophils Relative % 93 %   Neutro Abs 32.9 (H) 1.7 - 7.7 K/uL   Lymphocytes Relative 1 %   Lymphs Abs 0.3 (L) 0.7 - 4.0 K/uL   Monocytes Relative 5 %   Monocytes Absolute 1.6 (H) 0.1 - 1.0 K/uL   Eosinophils Relative 0 %   Eosinophils Absolute 0.0 0.0 - 0.5 K/uL   Basophils Relative 0 %   Basophils Absolute 0.1 0.0 - 0.1 K/uL   Immature Granulocytes 1 %   Abs Immature Granulocytes 0.29 (H) 0.00 - 0.07 K/uL    Comment: Performed at [facility], [address] [place],  [address]  Comprehensive metabolic panel     Status: Abnormal   Collection Time: 02/26/22  3:00 AM  Result Value Ref Range   Sodium 133 (L) 135 - 145 mmol/L   Potassium 5.1 3.5 - 5.1 mmol/L   Chloride 86 (L) 98 - 111 mmol/L   CO2 40 (H) 22 - 32 mmol/L   Glucose, Bld 168 (H) 70 - 99 mg/dL    Comment: Glucose reference range applies only to samples taken after fasting for at least 8 hours.   BUN 30 (H) 8 - 23 mg/dL   Creatinine, Ser  0.41 (L) 0.61 - 1.24 mg/dL   Calcium 9.2 8.9 - 10.3 mg/dL   Total Protein 8.2 (H) 6.5 - 8.1 g/dL  Albumin 2.3 (L) 3.5 - 5.0 g/dL   AST 20 15 - 41 U/L   ALT 41 0 - 44 U/L   Alkaline Phosphatase 158 (H) 38 - 126 U/L   Total Bilirubin 0.2 (L) 0.3 - 1.2 mg/dL   GFR, Estimated >60 >60 mL/min    Comment: (NOTE) Calculated using the CKD-EPI Creatinine Equation (2021)    Anion gap 7 5 - 15    Comment: Performed at [facility] [address] [place], [place] [address]  Lactic acid, plasma     Status: None   Collection Time: 02/26/22  5:25 AM  Result Value Ref Range   Lactic Acid, Venous 0.9 0.5 - 1.9 mmol/L    Comment: Performed at [facility] [address], [place], [place] [address]  Lactic acid, plasma     Status: None   Collection Time: 02/26/22  8:23 AM  Result Value Ref Range   Lactic Acid, Venous 1.1 0.5 - 1.9 mmol/L    Comment: Performed at [facility] [address] [place], [place] [address]   EEG adult  Result Date: 02/26/2022 Lora Havens, MD     02/26/2022  8:41 AM Patient Name: SYON TEWS MRN: [id number] Epilepsy Attending: Lora Havens Referring Physician/Provider: Merrily Pew, MD Date: 02/26/2022 Duration: 24.09 mins Patient history: 82yo M with h/o 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 EEG to evaluate for seizure. Level of alertness:  lethargic AEDs during EEG study: None Technical aspects: This EEG study was done with scalp electrodes positioned according to the 10-20 International system of electrode placement. Electrical activity was reviewed with band pass filter of 1-70Hz , sensitivity of 7 uV/mm, display speed of 35mm/sec with a 60Hz  notched filter applied as appropriate. EEG data were recorded continuously and digitally stored.  Video monitoring was available and reviewed as appropriate. Description: EEG showed continuous generalized 3 to 6 Hz theta-delta slowing  Hyperventilation and photic stimulation were not  performed.   ABNORMALITY - Continuous slow, generalized IMPRESSION: This study is suggestive of moderate to severe diffuse encephalopathy, nonspecific etiology. No seizures or epileptiform discharges were seen throughout the recording. Lora Havens   DG Chest Portable 1 View  Result Date: 02/26/2022 CLINICAL DATA:  Evaluate for altered mental status EXAM: PORTABLE CHEST 1 VIEW COMPARISON:  01/31/2022 FINDINGS: 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 Prior CABG. Heart and mediastinal contours within normal limits. Patchy bilateral airspace opacities. No visible effusions or acute bony abnormality. IMPRESSION: Patchy bilateral airspace disease could reflect pneumonia. Electronically Signed   By: Rolm Baptise M.D.   On: 02/26/2022 03:28    Pending Labs Unresulted Labs (From admission, onward)     Start     Ordered   02/27/22 0500  Blood gas, arterial  Tomorrow morning,   R        02/26/22 0846   02/27/22 2130  Basic metabolic panel  Tomorrow morning,   R        02/26/22 0846   02/27/22 0500  CBC  Tomorrow morning,   R        02/26/22 0846   02/26/22 0845  Culture, blood (Routine X 2) w Reflex to ID Panel  BLOOD CULTURE X 2,   R (with TIMED occurrences)      02/26/22 0846   02/26/22 0845  Culture, Respiratory w Gram Stain (tracheal aspirate)  Once,   R        02/26/22 0846   02/26/22 0845  Urine Culture (for pregnant, neutropenic or urologic patients or patients with an indwelling urinary catheter)  (Urine Culture)  Once,   R       Question:  Indication  Answer:  Altered mental status (if no other cause identified)   02/26/22 0846   02/26/22 0816  MRSA Next Gen by PCR, Nasal  (MRSA Screening)  Once,   URGENT        02/26/22 0816   02/26/22 0400  Culture, blood (single)  (Undifferentiated -> Now sepsis confirmed (treatment and sepsis specific nursing orders))  ONCE - STAT,   STAT        02/26/22 0400   02/26/22 0314  Levetiracetam level  Once,   URGENT        02/26/22 0313             Vitals/Pain Today's Vitals   02/26/22 0745 02/26/22 0800 02/26/22 0815 02/26/22 0830  BP: 96/62 (!) 91/58 (!) 86/59 (!) 87/57  Pulse: (!) 110 (!) 113 (!) 113 (!) 114  Resp: (!) 22 (!) 24 (!) 21 (!) 24  Temp: (!) 95.5 F (35.3 C) (!) 95.7 F (35.4 C) (!) 96 F (35.6 C) (!) 96.2 F (35.7 C)  TempSrc:      SpO2: 97% 99% 99% 99%    Isolation Precautions No active isolations  Medications Medications  lactated ringers infusion ( Intravenous New Bag/Given 02/26/22 0414)  meropenem (MERREM) 1 g in sodium chloride 0.9 % 100 mL IVPB (0 g Intravenous Stopped 02/26/22 0654)  linezolid (ZYVOX) IVPB 600 mg (has no administration in time range)  docusate sodium (COLACE) capsule 100 mg (has no administration in time range)  polyethylene glycol (MIRALAX / GLYCOLAX) packet 17 g (has no administration in time range)  famotidine (PEPCID) tablet 20 mg (has no administration in time range)  pantoprazole (PROTONIX) injection 40 mg (has no administration in time range)  0.9 %  sodium chloride infusion (has no administration in time range)  albuterol (PROVENTIL) (2.5 MG/3ML) 0.083% nebulizer solution 2.5 mg (has no administration in time range)  albuterol (PROVENTIL) (2.5 MG/3ML) 0.083% nebulizer solution 2.5 mg (has no administration in time range)  LORazepam (ATIVAN) injection 2 mg (2 mg Intravenous Given 02/26/22 0255)  levETIRAcetam (KEPPRA) 2,000 mg in sodium chloride 0.9 % 250 mL IVPB (0 mg Intravenous Stopped 02/26/22 0413)  lactated ringers bolus 1,000 mL (0 mLs Intravenous Stopped 02/26/22 0653)  LORazepam (ATIVAN) injection 2 mg (2 mg Intravenous Given 02/26/22 0544)  lactated ringers bolus 1,000 mL (0 mLs Intravenous Stopped 02/26/22 0653)    Mobility non-ambulatory     Focused Assessments    R Recommendations: See Admitting Provider Note  Report given to:   Additional Notes: vented pt,foley, septic, not responsive and full code

## 2022-02-26 NOTE — Sepsis Progress Note (Signed)
Notified provider via secure chat of need to order lactic acid.

## 2022-02-26 NOTE — ED Notes (Signed)
NP at bedside.

## 2022-02-26 NOTE — Progress Notes (Signed)
Pt transported from ED26 to CT, then 4N23 without event.

## 2022-02-26 NOTE — Progress Notes (Signed)
Initial Nutrition Assessment  DOCUMENTATION CODES:   Underweight, Severe malnutrition in context of chronic illness  INTERVENTION:   Initiate tube feeds via PEG: - Start Osmolite 1.5 @ 20 ml/hr and advance rate by 10 ml q 4 hours to goal rate of 60 ml/hr (1440 ml/day)  Tube feeding regimen at goal rate provides 2160 kcal, 90 grams of protein, and 1097 ml of H2O.   - 1 packet Juven BID per tube, each packet provides 95 calories, 2.5 grams of protein, and 9.8 grams of carbohydrate; also contains L-arginine and L-glutamine, vitamin C, vitamin E, vitamin B-12, zinc, calcium, and calcium Beta-hydroxy-Beta-methylbutyrate to support wound healing  - MVI with minerals daily per tube  NUTRITION DIAGNOSIS:   Severe Malnutrition related to chronic illness (chronic respiratory failure, CHF) as evidenced by severe fat depletion, severe muscle depletion.  GOAL:   Patient will meet greater than or equal to 90% of their needs  MONITOR:   TF tolerance, Skin, Labs, Weight trends  REASON FOR ASSESSMENT:   Consult Enteral/tube feeding initiation and management, Assessment of nutrition requirement/status  ASSESSMENT:   82 year old male who presented to the ED on 2/07 from [facility] after being found unresponsive. PMH of chronic hypoxic respiratory failure s/p trach, s/p PEG, CHF, HLD, Parkinson's. Pt admitted with sepsis due to bilateral multifocal PNA.  Consult received for tube feeding initiation and management. Discussed pt with RN. Pt is well-known to RD team from multiple previous admissions.  Reviewed weight history. Pt with a total weight loss of 14 kg since 03/26/21. This is a 22.3% weight loss in 11 months which is severe and significant for timeframe. However, pt with CHF diagnosis and weight fluctuates related to volume status. Pt meets criteria for severe malnutrition based on NFPE.  Will order tube feeds as well as MVI and Juven to promote wound healing.  Patient is on chronic  ventilator support via trach MV: 10.7 L/min Temp (24hrs), Avg:95.6 F (35.3 C), Min:90.8 F (32.7 C), Max:99.9 F (37.7 C)  Medications reviewed and include: pepcid, IV protonix, IV abx  Labs reviewed: BUN 30, creatinine 0.41, WBC 35.2  NUTRITION - FOCUSED PHYSICAL EXAM:  Flowsheet Row Most Recent Value  Orbital Region Severe depletion  Upper Arm Region Severe depletion  Thoracic and Lumbar Region Severe depletion  Buccal Region Severe depletion  Temple Region Severe depletion  Clavicle Bone Region Severe depletion  Clavicle and Acromion Bone Region Severe depletion  Scapular Bone Region Severe depletion  Dorsal Hand Severe depletion  Patellar Region Severe depletion  Anterior Thigh Region Severe depletion  Posterior Calf Region Severe depletion  Edema (RD Assessment) Mild  Hair Reviewed  Eyes Reviewed  Mouth Reviewed  Skin Reviewed  Nails Reviewed       Diet Order:   Diet Order             Diet NPO time specified  Diet effective now                   EDUCATION NEEDS:   No education needs have been identified at this time  Skin:  Skin Assessment: Skin Integrity Issues: Stage I: sacrum, coccyx Stage II: L buttocks  Last BM:  no documented BM  Height:   Ht Readings from Last 1 Encounters:  02/04/22 5\' 10"  (1.778 m)    Weight:   Wt Readings from Last 1 Encounters:  02/07/22 48.6 kg    BMI:  15.38 kg/m2  Estimated Nutritional Needs:   Kcal:  1900-2100  Protein:  85-100 grams  Fluid:  >/= 1.8 L    Gustavus Bryant, MS, RD, LDN Inpatient Clinical Dietitian Please see AMiON for contact information.

## 2022-02-26 NOTE — ED Provider Notes (Signed)
[facility] Provider Note   CSN: [id number] Arrival date & time: 02/26/22  0240     History  Chief Complaint  Patient presents with   Loss of Consciousness    Austin Davis is a 82 y.o. male.  No history from patient. Sounds like he became acutely unresponsive and altered tonight. H/o seizures and pneumonia. Trach in place, vented, from [facility]. Reportedly baseline will move eyes, head, mouth words to verbal stimuli.    Loss of Consciousness      Home Medications Prior to Admission medications   Medication Sig Start Date End Date Taking? Authorizing Provider  acetaminophen (TYLENOL) 325 MG tablet Place 650 mg into feeding tube every 8 (eight) hours as needed for mild pain or fever.   Yes [provider]  ALPRAZolam Duanne Moron) 0.5 MG tablet Place 0.5 mg into feeding tube every 8 (eight) hours as needed for anxiety.   Yes [provider]  esomeprazole (NEXIUM) 40 MG capsule 40 mg daily. Via feeding tube   Yes [provider]  fluticasone (FLONASE) 50 MCG/ACT nasal spray Place 1 spray into both nostrils every 6 (six) hours as needed for allergies or rhinitis.   Yes [provider]  folic acid (FOLVITE) 1 MG tablet Take 1 tablet (1 mg total) by mouth daily. Patient taking differently: Place 1 mg into feeding tube daily. 09/29/20  Yes [name], MD  furosemide (LASIX) 20 MG tablet Place 1 tablet (20 mg total) into feeding tube daily as needed for fluid or edema. Patient taking differently: Place 20 mg into feeding tube daily. 07/03/21  Yes Mercy Riding, MD  hydrOXYzine (ATARAX) 25 MG tablet Place 25 mg into feeding tube every 6 (six) hours as needed for anxiety. 11/01/21  Yes [provider]  hydrOXYzine (ATARAX) 50 MG tablet Place 50 mg into feeding tube every 6 (six) hours.   Yes [provider]  Lactobacillus Rhamnosus, GG, (CULTURELLE PO) Place 10 Billion Cells into feeding  tube daily.   Yes [provider]  lactulose (CHRONULAC) 10 GM/15ML solution Place 20 g into feeding tube every 12 (twelve) hours as needed (constipation).   Yes [provider]  levalbuterol (XOPENEX) 1.25 MG/3ML nebulizer solution Take 1.25 mg by nebulization every 6 (six) hours.   Yes [provider]  levETIRAcetam (KEPPRA) 100 MG/ML solution Place 1,000 mg into feeding tube 2 (two) times daily.   Yes [provider]  levothyroxine (SYNTHROID) 25 MCG tablet Place 25 mcg into feeding tube daily.   Yes [provider]  LORazepam (ATIVAN) 2 MG/ML injection Inject 2 mg into the muscle as needed for seizure.   Yes [provider]  Melatonin 3 MG TBDP Place 6 mg into feeding tube at bedtime.   Yes [provider]  metoprolol tartrate (LOPRESSOR) 25 MG tablet Place 25 mg into feeding tube as needed (AFIB. Do not administer if low BP, systolic <99).   Yes [provider]  midodrine (PROAMATINE) 10 MG tablet Place 1 tablet (10 mg total) into feeding tube 3 (three) times daily with meals. Patient taking differently: Place 10 mg into feeding tube 3 (three) times daily. 05/01/21  Yes Thurnell Lose, MD  Multiple Vitamins-Iron (MULTIVITAMINS WITH IRON) TABS tablet Take 1 tablet per tube daily   Yes [provider]  Nutritional Supplements (FEEDING SUPPLEMENT, OSMOLITE 1.5 CAL,) LIQD Place 55 mL/hr into feeding tube See admin instructions. 55 ml/hr for 22-24 hours = 1815-1980  kcal & 1520-1603 ml   Yes [provider]  Omega-3 Fatty Acids (FISH OIL) 1000 MG CAPS Place 1,000 mg into feeding tube daily.   Yes [provider]  polyethylene glycol (MIRALAX / GLYCOLAX) 17 g packet Place 17 g into feeding tube every other day.   Yes [provider]  propranolol (INDERAL) 10 MG tablet Place 10 mg into feeding tube daily as needed (Tremor. Hold if HR is less than 60 or SBP less than 90).   Yes [provider]  sertraline (ZOLOFT) 50 MG tablet Place 50 mg into feeding tube daily.   Yes [provider]  tamsulosin (FLOMAX) 0.4 MG CAPS capsule 0.4 mg daily. Via feeding tube   Yes [provider]  valproic acid (DEPAKENE) 250 MG/5ML solution Place 250 mg into feeding tube 2 (two) times daily.   Yes [provider]  Water For Irrigation, Sterile (STERILE WATER FOR IRRIGATION) Irrigate with 100 mLs as directed See admin instructions. 100 ml H2O flush every 4 hours   Yes [provider]  meropenem 1 g in sodium chloride 0.9 % 100 mL Inject 1 g into the vein every 8 (eight) hours. Patient not taking: Reported on 02/26/2022 02/07/22   Cristal Generous, NP      Allergies    Prednisone and Cortisone    Review of Systems   Review of Systems  Cardiovascular:  Positive for syncope.    Physical Exam Updated Vital Signs BP 95/60   Pulse (!) 105   Temp (!) 95.1 F (35.1 C)   Resp (!) 25   SpO2 95%  Physical Exam Vitals and nursing note reviewed.  Constitutional:      Comments: unresponsive  Eyes:     Pupils: Pupils are equal, round, and reactive to light.  Pulmonary:     Comments: On vent Abdominal:     General: There is no distension.  Musculoskeletal:        General: Normal range of motion.  Skin:    General: Skin is warm.  Neurological:     General: No focal deficit present.     ED Results / Procedures / Treatments   Labs (all labs ordered are listed, but only abnormal results are displayed) Labs Reviewed  CBC WITH DIFFERENTIAL/PLATELET - Abnormal; Notable for the following components:      Result Value   WBC 35.2 (*)    Hemoglobin 11.2 (*)    HCT 37.9 (*)    MCHC 29.6 (*)    RDW 19.7 (*)    Platelets 495 (*)    Neutro Abs 32.9 (*)    Lymphs Abs 0.3 (*)    Monocytes Absolute 1.6 (*)    Abs Immature Granulocytes 0.29 (*)    All other components within normal limits  COMPREHENSIVE METABOLIC PANEL - Abnormal; Notable for the  following components:   Sodium 133 (*)    Chloride 86 (*)    CO2 40 (*)    Glucose, Bld 168 (*)    BUN 30 (*)    Creatinine, Ser 0.41 (*)    Total Protein 8.2 (*)    Albumin 2.3 (*)    Alkaline Phosphatase 158 (*)    Total Bilirubin 0.2 (*)    All other components within normal limits  CBG MONITORING, ED - Abnormal; Notable for the following components:   Glucose-Capillary 148 (*)    All other components within normal limits  CULTURE, BLOOD (SINGLE)  LACTIC ACID, PLASMA  LEVETIRACETAM  LEVEL  LACTIC ACID, PLASMA    EKG None  Radiology DG Chest Portable 1 View  Result Date: 02/26/2022 CLINICAL DATA:  Evaluate for altered mental status EXAM: PORTABLE CHEST 1 VIEW COMPARISON:  01/31/2022 FINDINGS: 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 Prior CABG. Heart and mediastinal contours within normal limits. Patchy bilateral airspace opacities. No visible effusions or acute bony abnormality. IMPRESSION: Patchy bilateral airspace disease could reflect pneumonia. Electronically Signed   By: Rolm Baptise M.D.   On: 02/26/2022 03:28    Procedures .Critical Care  Performed by: Merrily Pew, MD Authorized by: Merrily Pew, MD   Critical care provider statement:    Critical care time (minutes):  30   Critical care was necessary to treat or prevent imminent or life-threatening deterioration of the following conditions:  Respiratory failure, sepsis and CNS failure or compromise   Critical care was time spent personally by me on the following activities:  Development of treatment plan with patient or surrogate, discussions with consultants, evaluation of patient's response to treatment, examination of patient, ordering and review of laboratory studies, ordering and review of radiographic studies, ordering and performing treatments and interventions, pulse oximetry, re-evaluation of patient's condition and review of old charts     Medications Ordered in ED Medications  lactated ringers  infusion ( Intravenous New Bag/Given 02/26/22 0414)  meropenem (MERREM) 1 g in sodium chloride 0.9 % 100 mL IVPB (0 g Intravenous Stopped 02/26/22 0654)  linezolid (ZYVOX) IVPB 600 mg (has no administration in time range)  LORazepam (ATIVAN) injection 2 mg (2 mg Intravenous Given 02/26/22 0255)  levETIRAcetam (KEPPRA) 2,000 mg in sodium chloride 0.9 % 250 mL IVPB (0 mg Intravenous Stopped 02/26/22 0413)  lactated ringers bolus 1,000 mL (0 mLs Intravenous Stopped 02/26/22 0653)  LORazepam (ATIVAN) injection 2 mg (2 mg Intravenous Given 02/26/22 0544)  lactated ringers bolus 1,000 mL (0 mLs Intravenous Stopped 02/26/22 7121)    ED Course/ Medical Decision Making/ A&P                             Medical Decision Making Amount and/or Complexity of Data Reviewed Labs: ordered. Radiology: ordered. ECG/medicine tests: ordered.  Risk Prescription drug management. Decision regarding hospitalization.   Patient with decreased responsiveness, eyes rolled into back of head.and rhythmic mouth motions. Concern for seizures, ativan given without relief. Keppra given and a second dose of ativan given. Relaxed and calm now, no longer with rhythmic motion of mouth. D/w Neurology, recommended stat eEEG and neuro consult if abnormal, but could be metabolic/infectious. Cxr with diffuse opacities concerning for pneumonia on my interpretation. D/w CCM for admission.   Final Clinical Impression(s) / ED Diagnoses Final diagnoses:  Sepsis, due to unspecified organism, unspecified whether acute organ dysfunction present [facility])  Altered mental status, unspecified altered mental status type    Rx / DC Orders ED Discharge Orders     None         Amine Adelson, Corene Cornea, MD 02/26/22 [phone]

## 2022-02-26 NOTE — ED Notes (Signed)
Report called to [facility] ICU room 24

## 2022-02-26 NOTE — H&P (Signed)
NAME:  Austin Davis, MRN:  [id number], DOB:  01-13-1941, LOS: 0 ADMISSION DATE:  02/26/2022, CONSULTATION DATE: 02/26/2022 REFERRING MD: Emergency department physician, CHIEF COMPLAINT: Altered mental status low blood pressure  History of Present Illness:  82 year old male with multiple admissions to [facility] from [facility] with recurrent sepsis, infection with multidrug-resistant organisms, ESBL, Pseudomonas, Klebsiella, Enterococcus is recently been treated with Zyvox and meropenem.  He is poorly responsive on admission he does have a history of Parkinson disease and history of seizure disorders EEG has been undertaken he also had a CT of the head for completeness.  He is not requiring vasopressor support at this time.  He was given gentle fluid resuscitation antimicrobial therapy and admitted to the intensive care for further evaluation and treatment.  His daughter [name] was contacted and at this time due to family matters CODE STATUS remains full code.  Pertinent  Medical History   Past Medical History:  Diagnosis Date   CHF (congestive heart failure) ([place])    Coronary artery disease    Essential tremor    GI bleed 2019   hospitalized at [facility] for one week   Headache    since childhood   Low blood sugar    since childhood, controlled by diet   Mitral regurgitation    Mitral valve prolapse    Osteopenia 2021   Paroxysmal atrial fibrillation ([place])    Tremor      Significant Hospital Events: Including procedures, antibiotic start and stop dates in addition to other pertinent events     Interim History / Subjective:  Transferred from [facility] to [facility] for low blood pressure and decreased level of consciousness  Objective   Blood pressure (!) 91/58, pulse (!) 113, temperature (!) 95.7 F (35.4 C), resp. rate (!) 24, SpO2 99 %.    Vent Mode: PCV FiO2 (%):  [70 %] 70 % Set Rate:  [20 bmp] 20 bmp PEEP:  [5 cmH20] 5 cmH20 Pressure  Support:  [20 cmH20] 20 cmH20 Plateau Pressure:  [22 cmH20] 22 cmH20   Intake/Output Summary (Last 24 hours) at 02/26/2022 0810 Last data filed at 02/26/2022 0654 Gross per 24 hour  Intake 2720.54 ml  Output --  Net 2720.54 ml   There were no vitals filed for this visit.  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  History of Parkinson disease Monitor   History of multiorgan organ drug-resistant infections, ESBL, VRE and appears to be septic at this time. Zyvox and meropenem Panculture Admit to the intensive care unit  Questionable sepsis Fluid resuscitation antibiotics at this time  History of hypothyroidism Synthroid  History of anemia Recent Labs    02/26/22  0300  HGB 11.2*   Transfuse per protocol  History of paroxysmal atrial fibrillation No anticoagulation due to secondary GI bleeds  Chronic hypotension Continue midodrine  Best Practice (right click and "Reselect all SmartList Selections" daily)   Diet/type: NPO DVT prophylaxis:  not indicated GI prophylaxis: PPI Lines: N/A Foley:  N/A Code Status:  full code Last date of multidisciplinary goals of care discussion [tbd] 02/26/2022 daughter Austin Davis called CODE STATUS confirmed full code at this time.  Labs   CBC: Recent Labs  Lab 02/26/22 0300  WBC 35.2*  NEUTROABS 32.9*  HGB 11.2*  HCT 37.9*  MCV 89.2  PLT 495*    Basic Metabolic Panel: Recent Labs  Lab 02/26/22 0300  NA 133*  K 5.1  CL 86*  CO2 40*  GLUCOSE 168*  BUN 30*  CREATININE 0.41*  CALCIUM 9.2   GFR: CrCl cannot be calculated (Unknown ideal weight.). Recent Labs  Lab 02/26/22 0300 02/26/22 0525  WBC 35.2*  --   LATICACIDVEN  --  0.9    Liver Function Tests: Recent Labs  Lab 02/26/22 0300  AST 20  ALT 41  ALKPHOS 158*  BILITOT 0.2*  PROT 8.2*  ALBUMIN 2.3*   No results for input(s): "LIPASE", "AMYLASE" in the last 168 hours. No results for input(s): "AMMONIA" in the last 168 hours.  ABG    Component Value Date/Time   PHART 7.564 (H) 01/05/2022 0051   PCO2ART 31.7 (L) 01/05/2022 0051   PO2ART 122 (H) 01/05/2022 0051   HCO3 39.3 (H) 02/01/2022 0013   TCO2 41 (H) 02/01/2022 0013   ACIDBASEDEF 2.0 05/30/2021 0052   O2SAT 41 02/01/2022 0013     Coagulation Profile: No results for input(s): "INR", "PROTIME" in the last 168 hours.  Cardiac Enzymes: No results for input(s): "CKTOTAL", "CKMB", "CKMBINDEX", "TROPONINI" in the last 168 hours.  HbA1C: Hgb A1c MFr Bld  Date/Time Value Ref Range Status  01/04/2022 11:22 PM 5.3 4.8 - 5.6 % Final    Comment:    (NOTE)         Prediabetes: 5.7 - 6.4         Diabetes: >6.4         Glycemic control for adults with diabetes: <7.0   04/11/2021 09:12 PM 5.5 4.8 - 5.6 % Final    Comment:    (NOTE) Pre diabetes:          5.7%-6.4%  Diabetes:              >6.4%  Glycemic control for   <7.0% adults with diabetes     CBG: Recent Labs  Lab 02/26/22 0257  GLUCAP 148*    Review of Systems:   NA  Past  Medical History:  He,  has a past medical history of CHF (congestive heart failure) ([place]), Coronary artery disease, Essential tremor, GI bleed (2019), Headache, Low blood sugar, Mitral regurgitation, Mitral valve prolapse, Osteopenia (2021), Paroxysmal atrial fibrillation ([place]), and Tremor.   Surgical History:   Past Surgical History:  Procedure Laterality Date   COLONOSCOPY WITH PROPOFOL N/A 02/19/2017   Procedure: COLONOSCOPY WITH PROPOFOL;  Surgeon: Wilford Corner, MD;  Location: [facility];  Service: Endoscopy;  Laterality: N/A;   IR GASTROSTOMY TUBE MOD SED  10/18/2020   IR IVC FILTER PLMT / S&I /IMG GUID/MOD SED  11/01/2020   laser eye surgery for retina detachment     MITRAL VALVE REPAIR  01/2003   monitor  02/05/2006   polyp removal  TONSILLECTOMY     tooth removal     as a teenager   TRACHEOSTOMY TUBE PLACEMENT N/A 10/26/2020   Procedure: TRACHEOSTOMY;  Surgeon: Rozetta Nunnery, MD;  Location: [facility];  Service: ENT;  Laterality: N/A;     Social History:   reports that he has never smoked. He has never used smokeless tobacco. He reports current alcohol use. He reports that he does not use drugs.   Family History:  His family history includes Asthma in his daughter; CAD in his mother; Epilepsy in his son; Heart disease in his mother; Heart failure in his father, maternal grandmother, and paternal grandmother; Migraines in his mother; Pneumonia in his maternal grandfather; Skin cancer in his father; Stroke in his paternal grandfather; Tremor in his father; Valvular heart disease in his father. There is no history of Parkinson's disease.   Allergies Allergies  Allergen Reactions   Prednisone Other (See Comments)    Listed as allergy on MAR Unknown reaction   Cortisone Other (See Comments)    Listed as allergy per MAR Unknown reaction     Home Medications  Prior to Admission medications   Medication Sig Start Date End Date Taking? Authorizing Provider   acetaminophen (TYLENOL) 325 MG tablet Place 650 mg into feeding tube every 8 (eight) hours as needed for mild pain or fever.   Yes [provider]  ALPRAZolam Duanne Moron) 0.5 MG tablet Place 0.5 mg into feeding tube every 8 (eight) hours as needed for anxiety.   Yes [provider]  esomeprazole (NEXIUM) 40 MG capsule 40 mg daily. Via feeding tube   Yes [provider]  fluticasone (FLONASE) 50 MCG/ACT nasal spray Place 1 spray into both nostrils every 6 (six) hours as needed for allergies or rhinitis.   Yes [provider]  folic acid (FOLVITE) 1 MG tablet Take 1 tablet (1 mg total) by mouth daily. Patient taking differently: Place 1 mg into feeding tube daily. 09/29/20  Yes [name], MD  furosemide (LASIX) 20 MG tablet Place 1 tablet (20 mg total) into feeding tube daily as needed for fluid or edema. Patient taking differently: Place 20 mg into feeding tube daily. 07/03/21  Yes Mercy Riding, MD  hydrOXYzine (ATARAX) 25 MG tablet Place 25 mg into feeding tube every 6 (six) hours as needed for anxiety. 11/01/21  Yes [provider]  hydrOXYzine (ATARAX) 50 MG tablet Place 50 mg into feeding tube every 6 (six) hours.   Yes [provider]  Lactobacillus Rhamnosus, GG, (CULTURELLE PO) Place 10 Billion Cells into feeding tube daily.   Yes [provider]  lactulose (CHRONULAC) 10 GM/15ML solution Place 20 g into feeding tube every 12 (twelve) hours as needed (constipation).   Yes [provider]  levalbuterol (XOPENEX) 1.25 MG/3ML nebulizer solution Take 1.25 mg by nebulization every 6 (six) hours.   Yes [provider]  levETIRAcetam (KEPPRA) 100 MG/ML solution Place 1,000 mg into feeding tube 2 (two) times daily.   Yes [provider]  levothyroxine (SYNTHROID) 25 MCG tablet Place 25 mcg into feeding tube daily.   Yes [provider]  LORazepam (ATIVAN) 2 MG/ML injection Inject 2 mg into the  muscle as needed for seizure.   Yes [provider]  Melatonin 3 MG TBDP Place 6 mg into feeding tube at bedtime.   Yes [provider]  metoprolol tartrate (LOPRESSOR) 25 MG tablet Place 25 mg into feeding tube as needed (AFIB. Do not administer if  low BP, systolic <44).   Yes [provider]  midodrine (PROAMATINE) 10 MG tablet Place 1 tablet (10 mg total) into feeding tube 3 (three) times daily with meals. Patient taking differently: Place 10 mg into feeding tube 3 (three) times daily. 05/01/21  Yes Thurnell Lose, MD  Multiple Vitamins-Iron (MULTIVITAMINS WITH IRON) TABS tablet Take 1 tablet per tube daily   Yes [provider]  Nutritional Supplements (FEEDING SUPPLEMENT, OSMOLITE 1.5 CAL,) LIQD Place 55 mL/hr into feeding tube See admin instructions. 55 ml/hr for 22-24 hours = 1815-1980 kcal & 1520-1603 ml   Yes [provider]  Omega-3 Fatty Acids (FISH OIL) 1000 MG CAPS Place 1,000 mg into feeding tube daily.   Yes [provider]  polyethylene glycol (MIRALAX / GLYCOLAX) 17 g packet Place 17 g into feeding tube every other day.   Yes [provider]  propranolol (INDERAL) 10 MG tablet Place 10 mg into feeding tube daily as needed (Tremor. Hold if HR is less than 60 or SBP less than 90).   Yes [provider]  sertraline (ZOLOFT) 50 MG tablet Place 50 mg into feeding tube daily.   Yes [provider]  tamsulosin (FLOMAX) 0.4 MG CAPS capsule 0.4 mg daily. Via feeding tube   Yes [provider]  valproic acid (DEPAKENE) 250 MG/5ML solution Place 250 mg into feeding tube 2 (two) times daily.   Yes [provider]  Water For Irrigation, Sterile (STERILE WATER FOR IRRIGATION) Irrigate with 100 mLs as directed See admin instructions. 100 ml H2O flush every 4 hours   Yes [provider]  meropenem 1 g in sodium chloride 0.9 % 100 mL Inject 1 g into the vein every 8 (eight) hours. Patient not  taking: Reported on 02/26/2022 02/07/22   Cristal Generous, NP     Critical care time: 27 min    Richardson Landry Kellyjo Edgren ACNP Acute Care Nurse Practitioner [facility] [place] Please consult Amion 02/26/2022, 8:11 AM

## 2022-02-26 NOTE — Procedures (Signed)
Patient Name: Austin Davis  MRN: [id number]  Epilepsy Attending: Lora Havens  Referring Physician/Provider: Merrily Pew, MD  Date: 02/26/2022 Duration: 24.09 mins  Patient history: 82yo M with h/o 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 EEG to evaluate for seizure.   Level of alertness:  lethargic   AEDs during EEG study: None  Technical aspects: This EEG study was done with scalp electrodes positioned according to the 10-20 International system of electrode placement. Electrical activity was reviewed with band pass filter of 1-70Hz , sensitivity of 7 uV/mm, display speed of 73mm/sec with a 60Hz  notched filter applied as appropriate. EEG data were recorded continuously and digitally stored.  Video monitoring was available and reviewed as appropriate.  Description: EEG showed continuous generalized 3 to 6 Hz theta-delta slowing  Hyperventilation and photic stimulation were not performed.     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 No seizures or epileptiform discharges were seen throughout the recording.  Jaymes Revels Barbra Sarks

## 2022-02-26 NOTE — ED Notes (Signed)
EGD tech at bedside

## 2022-02-26 NOTE — Sepsis Progress Note (Signed)
Following per sepsis protocol   

## 2022-02-26 NOTE — Progress Notes (Signed)
Pt changed from [facility] to [facility] per Dr. Tacy Learn d/t VT around 266ml.  Unit RT aware and ABG will be obtained within 1 hour.

## 2022-02-26 NOTE — Progress Notes (Signed)
STAT EEG complete - results pending. ? ?

## 2022-02-27 DIAGNOSIS — A419 Sepsis, unspecified organism: Secondary | ICD-10-CM | POA: Diagnosis not present

## 2022-02-27 DIAGNOSIS — J9601 Acute respiratory failure with hypoxia: Secondary | ICD-10-CM | POA: Diagnosis not present

## 2022-02-27 DIAGNOSIS — R4182 Altered mental status, unspecified: Secondary | ICD-10-CM | POA: Diagnosis not present

## 2022-02-27 LAB — CBC
HCT: 30.7 % — ABNORMAL LOW (ref 39.0–52.0)
Hemoglobin: 9.4 g/dL — ABNORMAL LOW (ref 13.0–17.0)
MCH: 26.2 pg (ref 26.0–34.0)
MCHC: 30.6 g/dL (ref 30.0–36.0)
MCV: 85.5 fL (ref 80.0–100.0)
Platelets: 355 10*3/uL (ref 150–400)
RBC: 3.59 MIL/uL — ABNORMAL LOW (ref 4.22–5.81)
RDW: 20.2 % — ABNORMAL HIGH (ref 11.5–15.5)
WBC: 18.7 10*3/uL — ABNORMAL HIGH (ref 4.0–10.5)
nRBC: 0 % (ref 0.0–0.2)

## 2022-02-27 LAB — POCT I-STAT 7, (LYTES, BLD GAS, ICA,H+H)
Acid-Base Excess: 17 mmol/L — ABNORMAL HIGH (ref 0.0–2.0)
Bicarbonate: 43.7 mmol/L — ABNORMAL HIGH (ref 20.0–28.0)
Calcium, Ion: 1.24 mmol/L (ref 1.15–1.40)
HCT: 30 % — ABNORMAL LOW (ref 39.0–52.0)
Hemoglobin: 10.2 g/dL — ABNORMAL LOW (ref 13.0–17.0)
O2 Saturation: 91 %
Patient temperature: 97.9
Potassium: 4.3 mmol/L (ref 3.5–5.1)
Sodium: 138 mmol/L (ref 135–145)
TCO2: 46 mmol/L — ABNORMAL HIGH (ref 22–32)
pCO2 arterial: 61.9 mmHg — ABNORMAL HIGH (ref 32–48)
pH, Arterial: 7.456 — ABNORMAL HIGH (ref 7.35–7.45)
pO2, Arterial: 61 mmHg — ABNORMAL LOW (ref 83–108)

## 2022-02-27 LAB — BASIC METABOLIC PANEL
Anion gap: 8 (ref 5–15)
BUN: 21 mg/dL (ref 8–23)
CO2: 41 mmol/L — ABNORMAL HIGH (ref 22–32)
Calcium: 8.7 mg/dL — ABNORMAL LOW (ref 8.9–10.3)
Chloride: 89 mmol/L — ABNORMAL LOW (ref 98–111)
Creatinine, Ser: <0.3 mg/dL — ABNORMAL LOW (ref 0.61–1.24)
Glucose, Bld: 93 mg/dL (ref 70–99)
Potassium: 3.9 mmol/L (ref 3.5–5.1)
Sodium: 138 mmol/L (ref 135–145)

## 2022-02-27 LAB — MAGNESIUM
Magnesium: 1.9 mg/dL (ref 1.7–2.4)
Magnesium: 2 mg/dL (ref 1.7–2.4)

## 2022-02-27 LAB — URINALYSIS, ROUTINE W REFLEX MICROSCOPIC
Bilirubin Urine: NEGATIVE
Glucose, UA: NEGATIVE mg/dL
Hgb urine dipstick: NEGATIVE
Ketones, ur: NEGATIVE mg/dL
Nitrite: NEGATIVE
Protein, ur: >=300 mg/dL — AB
Specific Gravity, Urine: 1.012 (ref 1.005–1.030)
WBC, UA: >50 WBC/hpf (ref 0–5)
pH: 8 (ref 5.0–8.0)

## 2022-02-27 LAB — GLUCOSE, CAPILLARY
Glucose-Capillary: 108 mg/dL — ABNORMAL HIGH (ref 70–99)
Glucose-Capillary: 93 mg/dL (ref 70–99)
Glucose-Capillary: 185 mg/dL — ABNORMAL HIGH (ref 70–99)
Glucose-Capillary: 121 mg/dL — ABNORMAL HIGH (ref 70–99)
Glucose-Capillary: 126 mg/dL — ABNORMAL HIGH (ref 70–99)
Glucose-Capillary: 133 mg/dL — ABNORMAL HIGH (ref 70–99)

## 2022-02-27 LAB — LEVETIRACETAM LEVEL: Levetiracetam Lvl: 62 ug/mL — ABNORMAL HIGH (ref 10.0–40.0)

## 2022-02-27 LAB — PHOSPHORUS
Phosphorus: 1.8 mg/dL — ABNORMAL LOW (ref 2.5–4.6)
Phosphorus: 3.3 mg/dL (ref 2.5–4.6)

## 2022-02-27 MED ORDER — POTASSIUM PHOSPHATES 15 MMOLE/5ML IV SOLN
25.0000 mmol | Freq: Once | INTRAVENOUS | Status: AC
  Administered 2022-02-27: 25 mmol via INTRAVENOUS
  Filled 2022-02-27: qty 8.33

## 2022-02-27 MED ORDER — SODIUM CHLORIDE 0.9 % IV SOLN: INTRAVENOUS | Status: DC | PRN

## 2022-02-27 MED ORDER — ACETAZOLAMIDE 250 MG PO TABS
500.0000 mg | ORAL_TABLET | Freq: Three times a day (TID) | ORAL | Status: AC
  Administered 2022-02-27 – 2022-02-28 (×6): 500 mg
  Filled 2022-02-27 (×6): qty 2

## 2022-02-27 MED ORDER — MAGNESIUM SULFATE IN D5W 1-5 GM/100ML-% IV SOLN
1.0000 g | Freq: Once | INTRAVENOUS | Status: AC
  Administered 2022-02-27: 1 g via INTRAVENOUS
  Filled 2022-02-27: qty 100

## 2022-02-27 MED ORDER — ALBUTEROL SULFATE (2.5 MG/3ML) 0.083% IN NEBU
2.5000 mg | INHALATION_SOLUTION | Freq: Three times a day (TID) | RESPIRATORY_TRACT | Status: DC
  Administered 2022-02-27 – 2022-03-03 (×14): 2.5 mg via RESPIRATORY_TRACT
  Filled 2022-02-27 (×14): qty 3

## 2022-02-27 MED ORDER — LEVETIRACETAM 100 MG/ML PO SOLN
1000.0000 mg | Freq: Two times a day (BID) | ORAL | Status: DC
  Administered 2022-02-27 – 2022-03-03 (×9): 1000 mg
  Filled 2022-02-27 (×11): qty 10

## 2022-02-27 MED ORDER — ENOXAPARIN SODIUM 40 MG/0.4ML IJ SOSY
40.0000 mg | PREFILLED_SYRINGE | Freq: Every day | INTRAMUSCULAR | Status: DC
  Administered 2022-02-27 – 2022-03-03 (×5): 40 mg via SUBCUTANEOUS
  Filled 2022-02-27 (×5): qty 0.4

## 2022-02-27 NOTE — Progress Notes (Signed)
NAME:  Austin Davis, MRN:  [id number], DOB:  09-Nov-1940, LOS: 1 ADMISSION DATE:  02/26/2022, CONSULTATION DATE: 02/26/2022 REFERRING MD: Emergency department physician, CHIEF COMPLAINT: Altered mental status low blood pressure  History of Present Illness:  82 year old male with multiple admissions to [facility] from [facility] with recurrent sepsis, infection with multidrug-resistant organisms, ESBL, Pseudomonas, Klebsiella, Enterococcus is recently been treated with Zyvox and meropenem.  He is poorly responsive on admission he does have a history of Parkinson disease and history of seizure disorders EEG has been undertaken he also had a CT of the head for completeness.  He is not requiring vasopressor support at this time.  He was given gentle fluid resuscitation antimicrobial therapy and admitted to the intensive care for further evaluation and treatment.  His daughter Spiros Greenfeld was contacted and at this time due to family matters CODE STATUS remains full code.  Pertinent  Medical History   Past Medical History:  Diagnosis Date   CHF (congestive heart failure) (HCC)    Coronary artery disease    Essential tremor    GI bleed 2019   hospitalized at [facility] for one week   Headache    since childhood   Low blood sugar    since childhood, controlled by diet   Mitral regurgitation    Mitral valve prolapse    Osteopenia 2021   Paroxysmal atrial fibrillation (HCC)    Tremor      Significant Hospital Events: Including procedures, antibiotic start and stop dates in addition to other pertinent events   2/7 admit from [facility] for poor mental status, started on Linezolid and Meropenem  Interim History / Subjective:  No overnight events Awake and trying to communicate this morning  EEG without seizure activity BP stable  Objective   Blood pressure (!) 135/92, pulse 98, temperature 99.9 F (37.7 C), temperature source Bladder, resp. rate (!) 26, weight 53.6 kg, SpO2 99  %.    Vent Mode: PCV FiO2 (%):  [40 %-70 %] 40 % Set Rate:  [20 bmp-32 bmp] 32 bmp Vt Set:  [580 mL] 580 mL PEEP:  [5 cmH20] 5 cmH20 Plateau Pressure:  [23 cmH20-33 cmH20] 23 cmH20   Intake/Output Summary (Last 24 hours) at 02/27/2022 0818 Last data filed at 02/27/2022 0800 Gross per 24 hour  Intake 2819.8 ml  Output 1575 ml  Net 1244.8 ml    Filed Weights   02/27/22 0800  Weight: 53.6 kg     General:  chronically ill-appearing M, trach and vent dependent, awake and trying to communicate HEENT: MM pink/moist, trach in place, pupils equal, sclera anicteric  Neuro: awake, following commands, mouthing words, upper extremity tremor bilaterally CV: s1s2 tachycardic, regular, no m/r/g PULM:  rhonchi and course breath sounds bilaterally GI: soft, PEG in place, non-tender Extremities: warm/dry, 1+ pedal edema  Skin: no rashes or lesions   Resolved Hospital Problem list     Assessment & Plan:   Acute Encephalopathy in the setting of baseline seizure disorder  He was unresponsive yesterday while at baseline will follow commands which prompted transfer from [facility]  -suspect this was in the setting of developing infection -EEG negative, CTH without acute findings -continue Keppra and Valproic acid -back to baseline mental status baseline today  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 Coli and Pseudomonas -continue Linezolid and Meropenem -UA, indwelling foley noted to be dirty on admission -follow blood, respiratory and urine cultures -Bronchodilators,  pulm toilet -titrate Vent setting to maintain SpO2 greater than or equal to 90%. -HOB elevated 30 degrees. -Plateau pressures less than 30 cm H20.  -Bronchial hygiene and RT/bronchodilator protocol.   History of Parkinson disease -at baseline  History of hypothyroidism -continue Synthroid  History of Anemia and GIB -monitor, no signs of bleeding, Hgb 11->9 -Transfuse per protocol  History of paroxysmal atrial fibrillation -No anticoagulation due to secondary GI bleeds  Chronic hypotension -Continue midodrine  Best Practice (right click and "Reselect all SmartList Selections" daily)   Diet/type: tubefeeds DVT prophylaxis: LMWH GI prophylaxis: PPI Lines: N/A Foley:  Yes, and it is still needed Code Status:  full code Last date of multidisciplinary goals of care discussion [tbd] 02/26/2022 daughter [name] called CODE STATUS confirmed full code at this time.  Labs   CBC: Recent Labs  Lab 02/26/22 0300 02/26/22 1114 02/27/22 0325 02/27/22 0454  WBC 35.2*  --   --  18.7*  NEUTROABS 32.9*  --   --   --   HGB 11.2* 10.2* 10.2* 9.4*  HCT 37.9* 30.0* 30.0* 30.7*  MCV 89.2  --   --  85.5  PLT 495*  --   --  355     Basic Metabolic Panel: Recent Labs  Lab 02/26/22 0300 02/26/22 1114 02/26/22 1625 02/27/22 0325 02/27/22 0454  NA 133* 135  --  138 138  K 5.1 5.1  --  4.3 3.9  CL 86*  --   --   --  89*  CO2 40*  --   --   --  41*  GLUCOSE 168*  --   --   --  93  BUN 30*  --   --   --  21  CREATININE 0.41*  --   --   --  <0.30*  CALCIUM 9.2  --   --   --  8.7*  MG  --   --  1.7  --  1.9  PHOS  --   --  2.5  --  1.8*    GFR: CrCl cannot be calculated (This lab value cannot be used to calculate CrCl because it is not a number: <0.30). Recent Labs  Lab 02/26/22 0300 02/26/22 0525 02/26/22 0823 02/27/22 0454  WBC 35.2*  --   --  18.7*  LATICACIDVEN  --  0.9 1.1  --      Liver Function Tests: Recent Labs  Lab 02/26/22 0300  AST 20  ALT 41   ALKPHOS 158*  BILITOT 0.2*  PROT 8.2*  ALBUMIN 2.3*    No results for input(s): "LIPASE", "AMYLASE" in the last 168 hours. No results for input(s): "AMMONIA" in the last 168 hours.  ABG    Component Value Date/Time   PHART 7.456 (H) 02/27/2022 0325   PCO2ART 61.9 (H) 02/27/2022 0325   PO2ART 61 (L) 02/27/2022 0325   HCO3 43.7 (H) 02/27/2022 0325   TCO2 46 (H) 02/27/2022 0325   ACIDBASEDEF 2.0 05/30/2021 0052   O2SAT 91 02/27/2022 0325     Coagulation Profile: No results for input(s): "INR", "PROTIME" in the last 168 hours.  Cardiac Enzymes: No results for input(s): "CKTOTAL", "CKMB", "CKMBINDEX", "TROPONINI" in the last  168 hours.  HbA1C: Hgb A1c MFr Bld  Date/Time Value Ref Range Status  01/04/2022 11:22 PM 5.3 4.8 - 5.6 % Final    Comment:    (NOTE)         Prediabetes: 5.7 - 6.4         Diabetes: >6.4         Glycemic control for adults with diabetes: <7.0   04/11/2021 09:12 PM 5.5 4.8 - 5.6 % Final    Comment:    (NOTE) Pre diabetes:          5.7%-6.4%  Diabetes:              >6.4%  Glycemic control for   <7.0% adults with diabetes     CBG: Recent Labs  Lab 02/26/22 1620 02/26/22 1944 02/26/22 2332 02/27/22 0344 02/27/22 0736  GLUCAP 91 88 144* 108* 93     Review of Systems:   NA  Past Medical History:  He,  has a past medical history of CHF (congestive heart failure) ([place]), Coronary artery disease, Essential tremor, GI bleed (2019), Headache, Low blood sugar, Mitral regurgitation, Mitral valve prolapse, Osteopenia (2021), Paroxysmal atrial fibrillation ([place]), and Tremor.   Surgical History:   Past Surgical History:  Procedure Laterality Date   COLONOSCOPY WITH PROPOFOL N/A 02/19/2017   Procedure: COLONOSCOPY WITH PROPOFOL;  Surgeon: Wilford Corner, MD;  Location: [facility];  Service: Endoscopy;  Laterality: N/A;   IR GASTROSTOMY TUBE MOD SED  10/18/2020   IR IVC FILTER PLMT / S&I /IMG GUID/MOD SED  11/01/2020   laser eye surgery  for retina detachment     MITRAL VALVE REPAIR  01/2003   monitor  02/05/2006   polyp removal     TONSILLECTOMY     tooth removal     as a teenager   TRACHEOSTOMY TUBE PLACEMENT N/A 10/26/2020   Procedure: TRACHEOSTOMY;  Surgeon: Rozetta Nunnery, MD;  Location: [facility];  Service: ENT;  Laterality: N/A;     Social History:   reports that he has never smoked. He has never used smokeless tobacco. He reports current alcohol use. He reports that he does not use drugs.   Family History:  His family history includes Asthma in his daughter; CAD in his mother; Epilepsy in his son; Heart disease in his mother; Heart failure in his father, maternal grandmother, and paternal grandmother; Migraines in his mother; Pneumonia in his maternal grandfather; Skin cancer in his father; Stroke in his paternal grandfather; Tremor in his father; Valvular heart disease in his father. There is no history of Parkinson's disease.   Allergies Allergies  Allergen Reactions   Prednisone Other (See Comments)    Listed as allergy on MAR Unknown reaction   Cortisone Other (See Comments)    Listed as allergy per MAR Unknown reaction     Home Medications  Prior to Admission medications   Medication Sig Start Date End Date Taking? Authorizing Provider  acetaminophen (TYLENOL) 325 MG tablet Place 650 mg into feeding tube every 8 (eight) hours as needed for mild pain or fever.   Yes [provider]  ALPRAZolam Duanne Moron) 0.5 MG tablet Place 0.5 mg into feeding tube every 8 (eight) hours as needed for anxiety.   Yes [provider]  esomeprazole (NEXIUM) 40 MG capsule 40 mg daily. Via feeding tube   Yes [provider]  fluticasone (FLONASE) 50 MCG/ACT nasal spray Place 1 spray into both nostrils every 6 (six) hours as needed for allergies  or rhinitis.   Yes [provider]  folic acid (FOLVITE) 1 MG tablet Take 1 tablet (1 mg total) by mouth daily. Patient taking differently: Place 1 mg  into feeding tube daily. 09/29/20  Yes [name], MD  furosemide (LASIX) 20 MG tablet Place 1 tablet (20 mg total) into feeding tube daily as needed for fluid or edema. Patient taking differently: Place 20 mg into feeding tube daily. 07/03/21  Yes Mercy Riding, MD  hydrOXYzine (ATARAX) 25 MG tablet Place 25 mg into feeding tube every 6 (six) hours as needed for anxiety. 11/01/21  Yes [provider]  hydrOXYzine (ATARAX) 50 MG tablet Place 50 mg into feeding tube every 6 (six) hours.   Yes [provider]  Lactobacillus Rhamnosus, GG, (CULTURELLE PO) Place 10 Billion Cells into feeding tube daily.   Yes [provider]  lactulose (CHRONULAC) 10 GM/15ML solution Place 20 g into feeding tube every 12 (twelve) hours as needed (constipation).   Yes [provider]  levalbuterol (XOPENEX) 1.25 MG/3ML nebulizer solution Take 1.25 mg by nebulization every 6 (six) hours.   Yes [provider]  levETIRAcetam (KEPPRA) 100 MG/ML solution Place 1,000 mg into feeding tube 2 (two) times daily.   Yes [provider]  levothyroxine (SYNTHROID) 25 MCG tablet Place 25 mcg into feeding tube daily.   Yes [provider]  LORazepam (ATIVAN) 2 MG/ML injection Inject 2 mg into the muscle as needed for seizure.   Yes [provider]  Melatonin 3 MG TBDP Place 6 mg into feeding tube at bedtime.   Yes [provider]  metoprolol tartrate (LOPRESSOR) 25 MG tablet Place 25 mg into feeding tube as needed (AFIB. Do not administer if low BP, systolic <93).   Yes [provider]  midodrine (PROAMATINE) 10 MG tablet Place 1 tablet (10 mg total) into feeding tube 3 (three) times daily with meals. Patient taking differently: Place 10 mg into feeding tube 3 (three) times daily. 05/01/21  Yes Thurnell Lose, MD  Multiple Vitamins-Iron (MULTIVITAMINS WITH IRON) TABS tablet Take 1 tablet per tube daily   Yes [provider]   Nutritional Supplements (FEEDING SUPPLEMENT, OSMOLITE 1.5 CAL,) LIQD Place 55 mL/hr into feeding tube See admin instructions. 55 ml/hr for 22-24 hours = 1815-1980 kcal & 1520-1603 ml   Yes [provider]  Omega-3 Fatty Acids (FISH OIL) 1000 MG CAPS Place 1,000 mg into feeding tube daily.   Yes [provider]  polyethylene glycol (MIRALAX / GLYCOLAX) 17 g packet Place 17 g into feeding tube every other day.   Yes [provider]  propranolol (INDERAL) 10 MG tablet Place 10 mg into feeding tube daily as needed (Tremor. Hold if HR is less than 60 or SBP less than 90).   Yes [provider]  sertraline (ZOLOFT) 50 MG tablet Place 50 mg into feeding tube daily.   Yes [provider]  tamsulosin (FLOMAX) 0.4 MG CAPS capsule 0.4 mg daily. Via feeding tube   Yes [provider]  valproic acid (DEPAKENE) 250 MG/5ML solution Place 250 mg into feeding tube 2 (two) times daily.   Yes [provider]  Water For Irrigation, Sterile (STERILE WATER FOR IRRIGATION) Irrigate with 100 mLs as directed See admin instructions. 100 ml H2O flush every 4 hours   Yes [provider]  meropenem 1 g in sodium chloride 0.9 % 100 mL Inject 1 g into the vein every 8 (eight) hours. Patient not  taking: Reported on 02/26/2022 02/07/22   Cristal Generous, NP     Critical care time: 37 min     CRITICAL CARE Performed by: Otilio Carpen Lavone Barrientes   Total critical care time: 37 minutes  Critical care time was exclusive of separately billable procedures and treating other patients.  Critical care was necessary to treat or prevent imminent or life-threatening deterioration.  Critical care was time spent personally by me on the following activities: development of treatment plan with patient and/or surrogate as well as nursing, discussions with consultants, evaluation of patient's response to treatment, examination of patient, obtaining history from patient or  surrogate, ordering and performing treatments and interventions, ordering and review of laboratory studies, ordering and review of radiographic studies, pulse oximetry and re-evaluation of patient's condition.   Otilio Carpen Danasha Melman, PA-C [facility] See Amion for pager If no response to pager , please call [phone] until 7pm After 7:00 pm call Elink  [phone]

## 2022-02-27 NOTE — Progress Notes (Signed)
Spoke with patient's daughter and updated her with current clinical status, agreed with current plan of car.  Otilio Carpen Emsley Custer, PA-C

## 2022-02-27 NOTE — Progress Notes (Signed)
  Transition of Care [facility]) Screening Note   Patient Details  Name: Austin Davis Date of Birth: 19-Jul-1940   Transition of Care [facility]) CM/SW Contact:    Benard Halsted, LCSW Phone Number: 02/27/2022, 2:04 PM    Transition of Care Department [facility]) has reviewed patient from [facility]. We will continue to monitor patient advancement through interdisciplinary progression rounds. If new patient transition needs arise, please place a TOC consult.

## 2022-02-28 DIAGNOSIS — A419 Sepsis, unspecified organism: Secondary | ICD-10-CM | POA: Diagnosis not present

## 2022-02-28 DIAGNOSIS — Z1621 Resistance to vancomycin: Secondary | ICD-10-CM

## 2022-02-28 DIAGNOSIS — N3000 Acute cystitis without hematuria: Secondary | ICD-10-CM

## 2022-02-28 DIAGNOSIS — R4182 Altered mental status, unspecified: Secondary | ICD-10-CM | POA: Diagnosis not present

## 2022-02-28 DIAGNOSIS — J9601 Acute respiratory failure with hypoxia: Secondary | ICD-10-CM | POA: Diagnosis not present

## 2022-02-28 DIAGNOSIS — Z1612 Extended spectrum beta lactamase (ESBL) resistance: Secondary | ICD-10-CM

## 2022-02-28 DIAGNOSIS — B9620 Unspecified Escherichia coli [E. coli] as the cause of diseases classified elsewhere: Secondary | ICD-10-CM | POA: Diagnosis not present

## 2022-02-28 LAB — CULTURE, RESPIRATORY W GRAM STAIN

## 2022-02-28 LAB — CBC
HCT: 31.8 % — ABNORMAL LOW (ref 39.0–52.0)
Hemoglobin: 9.3 g/dL — ABNORMAL LOW (ref 13.0–17.0)
MCH: 26.1 pg (ref 26.0–34.0)
MCHC: 29.2 g/dL — ABNORMAL LOW (ref 30.0–36.0)
MCV: 89.3 fL (ref 80.0–100.0)
Platelets: 411 10*3/uL — ABNORMAL HIGH (ref 150–400)
RBC: 3.56 MIL/uL — ABNORMAL LOW (ref 4.22–5.81)
RDW: 20.8 % — ABNORMAL HIGH (ref 11.5–15.5)
WBC: 14.2 10*3/uL — ABNORMAL HIGH (ref 4.0–10.5)
nRBC: 0 % (ref 0.0–0.2)

## 2022-02-28 LAB — GLUCOSE, CAPILLARY
Glucose-Capillary: 125 mg/dL — ABNORMAL HIGH (ref 70–99)
Glucose-Capillary: 132 mg/dL — ABNORMAL HIGH (ref 70–99)
Glucose-Capillary: 153 mg/dL — ABNORMAL HIGH (ref 70–99)
Glucose-Capillary: 145 mg/dL — ABNORMAL HIGH (ref 70–99)
Glucose-Capillary: 151 mg/dL — ABNORMAL HIGH (ref 70–99)
Glucose-Capillary: 161 mg/dL — ABNORMAL HIGH (ref 70–99)

## 2022-02-28 LAB — BASIC METABOLIC PANEL
Anion gap: 4 — ABNORMAL LOW (ref 5–15)
BUN: 26 mg/dL — ABNORMAL HIGH (ref 8–23)
CO2: 37 mmol/L — ABNORMAL HIGH (ref 22–32)
Calcium: 8.7 mg/dL — ABNORMAL LOW (ref 8.9–10.3)
Chloride: 95 mmol/L — ABNORMAL LOW (ref 98–111)
Creatinine, Ser: 0.31 mg/dL — ABNORMAL LOW (ref 0.61–1.24)
GFR, Estimated: >60 mL/min (ref >60)
Glucose, Bld: 124 mg/dL — ABNORMAL HIGH (ref 70–99)
Potassium: 3.8 mmol/L (ref 3.5–5.1)
Sodium: 136 mmol/L (ref 135–145)

## 2022-02-28 LAB — URINE CULTURE: Culture: >=100000 — AB

## 2022-02-28 LAB — MAGNESIUM: Magnesium: 2.1 mg/dL (ref 1.7–2.4)

## 2022-02-28 MED ORDER — VALPROIC ACID 250 MG/5ML PO SOLN
250.0000 mg | Freq: Three times a day (TID) | ORAL | Status: DC
  Administered 2022-02-28 – 2022-03-03 (×10): 250 mg
  Filled 2022-02-28 (×11): qty 5

## 2022-02-28 MED ORDER — VALPROIC ACID 250 MG/5ML PO SOLN: 250.0000 mg | Freq: Two times a day (BID) | ORAL | Status: DC

## 2022-02-28 NOTE — Progress Notes (Signed)
CSW spoke with Angie in admissions. She reported they are not able to accept weekend admissions.   Gilmore Laroche, MSW, [facility]

## 2022-02-28 NOTE — Progress Notes (Signed)
NAME:  Austin Davis, MRN:  [id number], DOB:  Aug 31, 1940, LOS: 2 ADMISSION DATE:  02/26/2022, CONSULTATION DATE: 02/26/2022 REFERRING MD: Emergency department physician, CHIEF COMPLAINT: Altered mental status low blood pressure  History of Present Illness:  82 year old male with multiple admissions to [facility] from [facility] with recurrent sepsis, infection with multidrug-resistant organisms, ESBL, Pseudomonas, Klebsiella, Enterococcus is recently been treated with Zyvox and meropenem.  He is poorly responsive on admission he does have a history of Parkinson disease and history of seizure disorders EEG has been undertaken he also had a CT of the head for completeness.  He is not requiring vasopressor support at this time.  He was given gentle fluid resuscitation antimicrobial therapy and admitted to the intensive care for further evaluation and treatment.  His daughter Elric Tirado was contacted and at this time due to family matters CODE STATUS remains full code.  Pertinent  Medical History   Past Medical History:  Diagnosis Date   CHF (congestive heart failure) ([place])    Coronary artery disease    Essential tremor    GI bleed 2019   hospitalized at [facility] for one week   Headache    since childhood   Low blood sugar    since childhood, controlled by diet   Mitral regurgitation    Mitral valve prolapse    Osteopenia 2021   Paroxysmal atrial fibrillation (HCC)    Tremor      Significant Hospital Events: Including procedures, antibiotic start and stop dates in addition to other pertinent events   2/7 admit from [facility] for poor mental status, started on Linezolid and Meropenem 2/9 growing VRE and ESBL E. Coli in urine, respiratory culture with pseudomonas   Interim History / Subjective:    Objective   Blood pressure 139/80, pulse (!) 110, temperature (!) 97.5 F (36.4 C), resp. rate (!) 29, weight 53.6 kg, SpO2 98 %.    Vent Mode: PCV FiO2 (%):  [40 %] 40  % Set Rate:  [32 bmp] 32 bmp PEEP:  [5 cmH20] 5 cmH20 Plateau Pressure:  [22 cmH20-24 cmH20] 24 cmH20   Intake/Output Summary (Last 24 hours) at 02/28/2022 0809 Last data filed at 02/28/2022 0700 Gross per 24 hour  Intake 3045.31 ml  Output 3200 ml  Net -154.69 ml    Filed Weights   02/27/22 0800  Weight: 53.6 kg     General:  chronically ill-appearing M, trach and vent dependent, awake and trying to communicate HEENT: MM pink/moist, trach in place, pupils equal, sclera anicteric  Neuro: awake, slightly less alert than yesterday, will follow some commands CV: s1s2 tachycardic, regular, no m/r/g PULM:  rhonchi and course breath sounds bilaterally GI: soft, PEG in place, non-tender Extremities: warm/dry, 1+ pedal edema  Skin: no rashes or lesions   Resolved Hospital Problem list     Assessment & Plan:   Acute Encephalopathy in the setting of baseline seizure disorder  He was unresponsive yesterday while at baseline will follow commands which prompted transfer from [facility]  -suspect this was in the setting of developing infection -EEG negative, CTH without acute findings -continue Keppra and Valproic acid -mental status improved  History of multi-drug resistant infections Pseudomonas VAP,  VRE and ESBL E. 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 Coli and Pseudomonas -UA, indwelling foley noted to be dirty on admission -follow blood cultures -Bronchodilators, pulm toilet -titrate Vent setting to maintain SpO2 greater than or equal to 90%. -HOB elevated 30 degrees. -Plateau pressures less than 30 cm H20.  -Bronchial hygiene and RT/bronchodilator  protocol.   History of Parkinson disease -at baseline  History of hypothyroidism -continue Synthroid  History of Anemia and GIB -monitor, no signs of bleeding, Hgb stable -Transfuse per protocol  History of paroxysmal atrial fibrillation -No anticoagulation due to secondary GI bleeds  Chronic hypotension -Continue midodrine  Best Practice (right click and "Reselect all SmartList Selections" daily)   Diet/type: tubefeeds DVT prophylaxis: LMWH GI prophylaxis: PPI Lines: N/A Foley:  Yes, and it is still needed Code Status:  full code Last date of multidisciplinary goals of care discussion [tbd] 02/26/2022 daughter [name] called CODE STATUS confirmed full code at this time. Daughter updated 2/8, pending 2/9  Labs   CBC: Recent Labs  Lab 02/26/22 0300 02/26/22 1114 02/27/22 0325 02/27/22 0454 02/28/22 0345  WBC 35.2*  --   --  18.7* 14.2*  NEUTROABS 32.9*  --   --   --   --   HGB 11.2* 10.2* 10.2* 9.4* 9.3*  HCT 37.9* 30.0* 30.0* 30.7* 31.8*  MCV 89.2  --   --  85.5 89.3  PLT 495*  --   --  355 411*     Basic Metabolic Panel: Recent Labs  Lab 02/26/22 0300 02/26/22 1114 02/26/22 1625 02/27/22 0325 02/27/22 0454 02/27/22 1633 02/28/22 0345  NA 133* 135  --  138 138  --  136  K 5.1 5.1  --  4.3 3.9  --  3.8  CL 86*  --   --   --  89*  --  95*  CO2 40*  --   --   --  41*  --  37*  GLUCOSE 168*  --   --   --  93  --  124*  BUN 30*  --   --   --  21  --  26*  CREATININE 0.41*  --   --   --  <0.30*  --  0.31*  CALCIUM 9.2  --   --   --  8.7*  --  8.7*  MG  --   --  1.7  --  1.9 2.0 2.1  PHOS  --   --  2.5  --  1.8* 3.3  --     GFR: Estimated Creatinine Clearance: 54.9 mL/min (A) (by C-G formula based on SCr of 0.31 mg/dL (L)). Recent Labs  Lab 02/26/22 0300 02/26/22 0525 02/26/22 0823 02/27/22 0454 02/28/22 0345  WBC 35.2*  --   --  18.7* 14.2*  LATICACIDVEN  --  0.9 1.1  --   --      Liver Function Tests: Recent Labs  Lab 02/26/22 0300   AST 20  ALT 41  ALKPHOS 158*  BILITOT 0.2*  PROT 8.2*  ALBUMIN 2.3*    No results for input(s): "LIPASE", "AMYLASE" in the last 168 hours. No results for input(s): "AMMONIA" in the last 168 hours.  ABG    Component Value Date/Time   PHART 7.456 (H) 02/27/2022 0325   PCO2ART 61.9 (H) 02/27/2022 0325   PO2ART 61 (L) 02/27/2022 0325   HCO3 43.7 (H) 02/27/2022 0325   TCO2 46 (H) 02/27/2022 0325   ACIDBASEDEF 2.0 05/30/2021 0052   O2SAT 91 02/27/2022 0325  Coagulation Profile: No results for input(s): "INR", "PROTIME" in the last 168 hours.  Cardiac Enzymes: No results for input(s): "CKTOTAL", "CKMB", "CKMBINDEX", "TROPONINI" in the last 168 hours.  HbA1C: Hgb A1c MFr Bld  Date/Time Value Ref Range Status  01/04/2022 11:22 PM 5.3 4.8 - 5.6 % Final    Comment:    (NOTE)         Prediabetes: 5.7 - 6.4         Diabetes: >6.4         Glycemic control for adults with diabetes: <7.0   04/11/2021 09:12 PM 5.5 4.8 - 5.6 % Final    Comment:    (NOTE) Pre diabetes:          5.7%-6.4%  Diabetes:              >6.4%  Glycemic control for   <7.0% adults with diabetes     CBG: Recent Labs  Lab 02/27/22 1154 02/27/22 1554 02/27/22 1934 02/27/22 2323 02/28/22 0318  GLUCAP 185* 121* 126* 133* 125*     Review of Systems:   NA  Past Medical History:  He,  has a past medical history of CHF (congestive heart failure) ([place]), Coronary artery disease, Essential tremor, GI bleed (2019), Headache, Low blood sugar, Mitral regurgitation, Mitral valve prolapse, Osteopenia (2021), Paroxysmal atrial fibrillation ([place]), and Tremor.   Surgical History:   Past Surgical History:  Procedure Laterality Date   COLONOSCOPY WITH PROPOFOL N/A 02/19/2017   Procedure: COLONOSCOPY WITH PROPOFOL;  Surgeon: Wilford Corner, MD;  Location: [facility];  Service: Endoscopy;  Laterality: N/A;   IR GASTROSTOMY TUBE MOD SED  10/18/2020   IR IVC FILTER PLMT / S&I /IMG GUID/MOD SED  11/01/2020    laser eye surgery for retina detachment     MITRAL VALVE REPAIR  01/2003   monitor  02/05/2006   polyp removal     TONSILLECTOMY     tooth removal     as a teenager   TRACHEOSTOMY TUBE PLACEMENT N/A 10/26/2020   Procedure: TRACHEOSTOMY;  Surgeon: Rozetta Nunnery, MD;  Location: [facility];  Service: ENT;  Laterality: N/A;     Social History:   reports that he has never smoked. He has never used smokeless tobacco. He reports current alcohol use. He reports that he does not use drugs.   Family History:  His family history includes Asthma in his daughter; CAD in his mother; Epilepsy in his son; Heart disease in his mother; Heart failure in his father, maternal grandmother, and paternal grandmother; Migraines in his mother; Pneumonia in his maternal grandfather; Skin cancer in his father; Stroke in his paternal grandfather; Tremor in his father; Valvular heart disease in his father. There is no history of Parkinson's disease.   Allergies Allergies  Allergen Reactions   Prednisone Other (See Comments)    Listed as allergy on MAR Unknown reaction   Cortisone Other (See Comments)    Listed as allergy per MAR Unknown reaction     Home Medications  Prior to Admission medications   Medication Sig Start Date End Date Taking? Authorizing Provider  acetaminophen (TYLENOL) 325 MG tablet Place 650 mg into feeding tube every 8 (eight) hours as needed for mild pain or fever.   Yes [provider]  ALPRAZolam Duanne Moron) 0.5 MG tablet Place 0.5 mg into feeding tube every 8 (eight) hours as needed for anxiety.   Yes [provider]  esomeprazole (NEXIUM) 40 MG capsule 40 mg daily. Via feeding tube  Yes [provider]  fluticasone (FLONASE) 50 MCG/ACT nasal spray Place 1 spray into both nostrils every 6 (six) hours as needed for allergies or rhinitis.   Yes [provider]  folic acid (FOLVITE) 1 MG tablet Take 1 tablet (1 mg total) by mouth daily. Patient taking  differently: Place 1 mg into feeding tube daily. 09/29/20  Yes [name], MD  furosemide (LASIX) 20 MG tablet Place 1 tablet (20 mg total) into feeding tube daily as needed for fluid or edema. Patient taking differently: Place 20 mg into feeding tube daily. 07/03/21  Yes Mercy Riding, MD  hydrOXYzine (ATARAX) 25 MG tablet Place 25 mg into feeding tube every 6 (six) hours as needed for anxiety. 11/01/21  Yes [provider]  hydrOXYzine (ATARAX) 50 MG tablet Place 50 mg into feeding tube every 6 (six) hours.   Yes [provider]  Lactobacillus Rhamnosus, GG, (CULTURELLE PO) Place 10 Billion Cells into feeding tube daily.   Yes [provider]  lactulose (CHRONULAC) 10 GM/15ML solution Place 20 g into feeding tube every 12 (twelve) hours as needed (constipation).   Yes [provider]  levalbuterol (XOPENEX) 1.25 MG/3ML nebulizer solution Take 1.25 mg by nebulization every 6 (six) hours.   Yes [provider]  levETIRAcetam (KEPPRA) 100 MG/ML solution Place 1,000 mg into feeding tube 2 (two) times daily.   Yes [provider]  levothyroxine (SYNTHROID) 25 MCG tablet Place 25 mcg into feeding tube daily.   Yes [provider]  LORazepam (ATIVAN) 2 MG/ML injection Inject 2 mg into the muscle as needed for seizure.   Yes [provider]  Melatonin 3 MG TBDP Place 6 mg into feeding tube at bedtime.   Yes [provider]  metoprolol tartrate (LOPRESSOR) 25 MG tablet Place 25 mg into feeding tube as needed (AFIB. Do not administer if low BP, systolic <53).   Yes [provider]  midodrine (PROAMATINE) 10 MG tablet Place 1 tablet (10 mg total) into feeding tube 3 (three) times daily with meals. Patient taking differently: Place 10 mg into feeding tube 3 (three) times daily. 05/01/21  Yes Thurnell Lose, MD  Multiple Vitamins-Iron (MULTIVITAMINS WITH IRON) TABS tablet Take 1 tablet per tube daily   Yes  [provider]  Nutritional Supplements (FEEDING SUPPLEMENT, OSMOLITE 1.5 CAL,) LIQD Place 55 mL/hr into feeding tube See admin instructions. 55 ml/hr for 22-24 hours = 1815-1980 kcal & 1520-1603 ml   Yes [provider]  Omega-3 Fatty Acids (FISH OIL) 1000 MG CAPS Place 1,000 mg into feeding tube daily.   Yes [provider]  polyethylene glycol (MIRALAX / GLYCOLAX) 17 g packet Place 17 g into feeding tube every other day.   Yes [provider]  propranolol (INDERAL) 10 MG tablet Place 10 mg into feeding tube daily as needed (Tremor. Hold if HR is less than 60 or SBP less than 90).   Yes [provider]  sertraline (ZOLOFT) 50 MG tablet Place 50 mg into feeding tube daily.   Yes [provider]  tamsulosin (FLOMAX) 0.4 MG CAPS capsule 0.4 mg daily. Via feeding tube   Yes [provider]  valproic acid (DEPAKENE) 250 MG/5ML solution Place 250 mg into feeding tube 2 (two) times daily.   Yes [provider]  Water For Irrigation, Sterile (STERILE WATER FOR IRRIGATION) Irrigate with 100 mLs as directed See admin instructions. 100 ml H2O flush every 4 hours   Yes [provider]  meropenem 1 g in sodium chloride 0.9 % 100 mL Inject 1 g into the vein every 8 (eight) hours. Patient not taking: Reported on 02/26/2022 02/07/22   Cristal Generous, NP     Critical care time: 42 min     CRITICAL CARE Performed by: Otilio Carpen Sofie Schendel   Total critical care time: 42 minutes  Critical care time was exclusive of separately billable procedures and treating other patients.  Critical care was necessary to treat or prevent imminent or life-threatening deterioration.  Critical care was time spent personally by me on the following activities: development of treatment plan with patient and/or surrogate as well as nursing, discussions with consultants, evaluation of patient's response to treatment, examination of patient, obtaining history  from patient or surrogate, ordering and performing treatments and interventions, ordering and review of laboratory studies, ordering and review of radiographic studies, pulse oximetry and re-evaluation of patient's condition.   Otilio Carpen Allex Lapoint, PA-C [facility] See Amion for pager If no response to pager , please call [phone] until 7pm After 7:00 pm call Elink  [phone]

## 2022-02-28 NOTE — Consult Note (Signed)
Date of Admission:  02/26/2022          Reason for Consult: ESBL and Enterococcus in urine w ? UTI   Referring Provider: Jacky Kindle, MD   Assessment:  ESBL E coli and AMP S but Vancomycin R E faecalis in urine? Questionable UTI Pseudmonas on culture from respiratory tract ? 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    Principal Problem:   Acute hypoxic respiratory failure (HCC)   Scheduled Meds:  acetaZOLAMIDE  500 mg Per Tube TID   albuterol  2.5 mg Nebulization TID   Chlorhexidine Gluconate Cloth  6 each Topical Q0600   enoxaparin (LOVENOX) injection  40 mg Subcutaneous Daily   hydrOXYzine  25 mg Per Tube Q6H   levETIRAcetam  1,000 mg Per Tube BID   levothyroxine  25 mcg Per Tube Daily   midodrine  10 mg Per Tube TID WC   multivitamin with minerals  1 tablet Per Tube Daily   nutrition supplement (JUVEN)  1 packet Per Tube BID BM   mouth rinse  15 mL Mouth Rinse Q2H   pantoprazole (PROTONIX) IV  40 mg Intravenous QHS   valproic acid  250 mg Per Tube TID   [START ON 03/04/2022] valproic acid  250 mg Per Tube BID   Continuous Infusions:  sodium chloride Stopped (02/28/22 0912)   feeding supplement (OSMOLITE 1.5 CAL) 60 mL/hr at 02/28/22 1200   meropenem (MERREM) IV Stopped (02/28/22 0619)   PRN Meds:.sodium chloride, acetaminophen, ALPRAZolam, docusate sodium, mouth rinse, polyethylene glycol  HPI: Austin Davis is a 82 y.o. male with chronic hypoxic respiratory failure with tracheostomy PEG tube who resides at [facility].  He is admitted to [facility] on a recurrent basis for recurrent seizure but also infections including ventilator associated pneumonias catheter associated urinary tract infections, admitted from [facility] with worsening confusion and low  blood pressure.  He was initially started on linezolid and meropenem upon admission chest x-ray did show some patchy opacities.  Urine analysis showed pyuria  Urine cultures have grown 100 colony thousand forming units of ESBL E. coli and vancomycin resistant but ampicillin sensitive Enterococcus faecalis.  Respiratory cultures are growing up fairly sensitive Pseudomonas.  Due to the inability to actually get a history of the patient is difficult to pin down what the cause of his sepsis is exactly but he is improving on current therapy.  If we simply target the organisms in the urine he can be simplified to meropenem alone to complete 5 days of treatment.  I spent 84 minutes with the patient including than 50% of the time in face to face and non face to face time with the  patient personally reviewing CXRalong with review of medical records in preparation for the visit and during the visit and in coordination of nis care.    Review of Systems: Review of Systems  Unable to perform ROS: Critical illness    Past Medical History:  Diagnosis Date   CHF (congestive heart failure) ([place])    Coronary artery disease    Essential tremor    GI bleed 2019   hospitalized at [facility] for one week   Headache    since childhood   Low blood sugar  since childhood, controlled by diet   Mitral regurgitation    Mitral valve prolapse    Osteopenia 2021   Paroxysmal atrial fibrillation (HCC)    Tremor     Social History   Tobacco Use   Smoking status: Never   Smokeless tobacco: Never   Tobacco comments:    only tried a few cigarettes when he was younger  Substance Use Topics   Alcohol use: Yes    Comment: "probably a 6 pack of beer a year"   Drug use: No    Family History  Problem Relation Age of Onset   Heart disease Mother    CAD Mother    Migraines Mother    Heart failure Father    Skin cancer Father    Valvular heart disease Father        prolapsed valve   Tremor  Father        "probably had a bit of tremor in his old age"   Heart failure Maternal Grandmother    Pneumonia Maternal Grandfather    Heart failure Paternal Grandmother    Stroke Paternal Grandfather    Asthma Daughter    Epilepsy Son    Parkinson's disease Neg Hx    Allergies  Allergen Reactions   Prednisone Other (See Comments)    Listed as allergy on MAR Unknown reaction   Cortisone Other (See Comments)    Listed as allergy per MAR Unknown reaction    OBJECTIVE: Blood pressure 121/76, pulse 98, temperature (!) 97.5 F (36.4 C), temperature source Bladder, resp. rate (!) 32, weight 53.6 kg, SpO2 98 %.  Physical Exam Constitutional:      Appearance: He is ill-appearing.  HENT:     Head: Normocephalic and atraumatic.     Mouth/Throat:     Dentition: Dental caries present.  Cardiovascular:     Rate and Rhythm: Tachycardia present.     Heart sounds: No murmur heard.    No friction rub. No gallop.  Pulmonary:     Effort: No respiratory distress.     Breath sounds: Rhonchi present.  Abdominal:     General: There is no distension.  Psychiatric:        Behavior: Behavior is cooperative.     Lab Results Lab Results  Component Value Date   WBC 14.2 (H) 02/28/2022   HGB 9.3 (L) 02/28/2022   HCT 31.8 (L) 02/28/2022   MCV 89.3 02/28/2022   PLT 411 (H) 02/28/2022    Lab Results  Component Value Date   CREATININE 0.31 (L) 02/28/2022   BUN 26 (H) 02/28/2022   NA 136 02/28/2022   K 3.8 02/28/2022   CL 95 (L) 02/28/2022   CO2 37 (H) 02/28/2022    Lab Results  Component Value Date   ALT 41 02/26/2022   AST 20 02/26/2022   ALKPHOS 158 (H) 02/26/2022   BILITOT 0.2 (L) 02/26/2022     Microbiology: Recent Results (from the past 240 hour(s))  Culture, blood (single)     Status: None (Preliminary result)   Collection Time: 02/26/22  4:00 AM   Specimen: BLOOD  Result Value Ref Range Status   Specimen Description BLOOD SITE NOT SPECIFIED  Final   Special Requests    Final    BOTTLES DRAWN AEROBIC AND ANAEROBIC Blood Culture results may not be optimal due to an excessive volume of blood received in culture bottles   Culture   Final    NO GROWTH 2 DAYS Performed at [facility]  [facility], [place] [address] [place], [place] [address]    Report Status PENDING  Incomplete  MRSA Next Gen by PCR, Nasal     Status: None   Collection Time: 02/26/22  8:16 AM   Specimen: Nasal Mucosa; Nasal Swab  Result Value Ref Range Status   MRSA by PCR Next Gen NOT DETECTED NOT DETECTED Final    Comment: (NOTE) The GeneXpert MRSA Assay (FDA approved for NASAL specimens only), is one component of a comprehensive MRSA colonization surveillance program. It is not intended to diagnose MRSA infection nor to guide or monitor treatment for MRSA infections. Test performance is not FDA approved in patients less than 8 years old. Performed at [facility], [place] [address] [place], [place] [address]   Culture, Respiratory w Gram Stain (tracheal aspirate)     Status: None   Collection Time: 02/26/22  8:45 AM   Specimen: Tracheal Aspirate; Respiratory  Result Value Ref Range Status   Specimen Description TRACHEAL ASPIRATE  Final   Special Requests NONE  Final   Gram Stain   Final    ABUNDANT WBC PRESENT, PREDOMINANTLY PMN RARE GRAM NEGATIVE RODS Performed at [facility], [place] [address], [place], [place] [address]    Culture MODERATE PSEUDOMONAS AERUGINOSA  Final   Report Status 02/28/2022 FINAL  Final   Organism ID, Bacteria PSEUDOMONAS AERUGINOSA  Final      Susceptibility   Pseudomonas aeruginosa - MIC*    CEFTAZIDIME 4 SENSITIVE Sensitive     CIPROFLOXACIN <=0.25 SENSITIVE Sensitive     GENTAMICIN 2 SENSITIVE Sensitive     IMIPENEM 1 SENSITIVE Sensitive     PIP/TAZO <=4 SENSITIVE Sensitive     CEFEPIME 2 SENSITIVE Sensitive     * MODERATE PSEUDOMONAS AERUGINOSA  Urine Culture (for pregnant, neutropenic or urologic patients or patients with an indwelling  urinary catheter)     Status: Abnormal   Collection Time: 02/26/22 10:23 AM   Specimen: Urine, Catheterized  Result Value Ref Range Status   Specimen Description URINE, CATHETERIZED  Final   Special Requests   Final    NONE Performed at [facility], [place] [address], [place], [place] [address]    Culture (A)  Final    >=100,000 COLONIES/mL ESCHERICHIA COLI >=100,000 COLONIES/mL ENTEROCOCCUS FAECALIS Confirmed Extended Spectrum Beta-Lactamase Producer (ESBL).  In bloodstream infections from ESBL organisms, carbapenems are preferred over piperacillin/tazobactam. They are shown to have a lower risk of mortality. VANCOMYCIN RESISTANT ENTEROCOCCUS ISOLATED    Report Status 02/28/2022 FINAL  Final   Organism ID, Bacteria ESCHERICHIA COLI (A)  Final   Organism ID, Bacteria ENTEROCOCCUS FAECALIS (A)  Final      Susceptibility   Escherichia coli - MIC*    AMPICILLIN >=32 RESISTANT Resistant     CEFAZOLIN >=64 RESISTANT Resistant     CEFEPIME >=32 RESISTANT Resistant     CEFTRIAXONE >=64 RESISTANT Resistant     CIPROFLOXACIN >=4 RESISTANT Resistant     GENTAMICIN <=1 SENSITIVE Sensitive     IMIPENEM <=0.25 SENSITIVE Sensitive     NITROFURANTOIN <=16 SENSITIVE Sensitive     TRIMETH/SULFA <=20 SENSITIVE Sensitive     AMPICILLIN/SULBACTAM >=32 RESISTANT Resistant     PIP/TAZO 8 SENSITIVE Sensitive     * >=100,000 COLONIES/mL ESCHERICHIA COLI   Enterococcus faecalis - MIC*    AMPICILLIN <=2 SENSITIVE Sensitive     NITROFURANTOIN <=16 SENSITIVE Sensitive     VANCOMYCIN >=32 RESISTANT Resistant  LINEZOLID 2 SENSITIVE Sensitive     * >=100,000 COLONIES/mL ENTEROCOCCUS [place] [place] [facility] [place] [phone] pager  02/28/2022, 12:46 PM

## 2022-03-01 DIAGNOSIS — J189 Pneumonia, unspecified organism: Secondary | ICD-10-CM

## 2022-03-01 LAB — GLUCOSE, CAPILLARY
Glucose-Capillary: 101 mg/dL — ABNORMAL HIGH (ref 70–99)
Glucose-Capillary: 123 mg/dL — ABNORMAL HIGH (ref 70–99)
Glucose-Capillary: 118 mg/dL — ABNORMAL HIGH (ref 70–99)
Glucose-Capillary: 126 mg/dL — ABNORMAL HIGH (ref 70–99)
Glucose-Capillary: 126 mg/dL — ABNORMAL HIGH (ref 70–99)
Glucose-Capillary: 129 mg/dL — ABNORMAL HIGH (ref 70–99)

## 2022-03-01 NOTE — Plan of Care (Signed)

## 2022-03-01 NOTE — Progress Notes (Signed)
NAME:  Austin Davis, MRN:  [id number], DOB:  [birth year], LOS: 3 ADMISSION DATE:  02/26/2022, CONSULTATION DATE: 02/26/2022 REFERRING MD: Emergency department physician, CHIEF COMPLAINT: Altered mental status low blood pressure  History of Present Illness:  82 year old male with multiple admissions to [facility] from [facility] with recurrent sepsis, infection with multidrug-resistant organisms, ESBL, Pseudomonas, Klebsiella, Enterococcus is recently been treated with Zyvox and meropenem.  He is poorly responsive on admission he does have a history of Parkinson disease and history of seizure disorders EEG has been undertaken he also had a CT of the head for completeness.  He is not requiring vasopressor support at this time.  He was given gentle fluid resuscitation antimicrobial therapy and admitted to the intensive care for further evaluation and treatment.  His daughter Valeria Krisko was contacted and at this time due to family matters CODE STATUS remains full code.  Pertinent  Medical History  CHF, CAD, Tremor, GI bleed, Headache, MR, PAF  Significant Hospital Events: Including procedures, antibiotic start and stop dates in addition to other pertinent events   2/7 admit from [facility] for poor mental status, started on Linezolid and Meropenem 2/9 growing VRE and ESBL E. Coli in urine, respiratory culture with pseudomonas  2/10 low Vt with PC >> change to [facility]  Interim History / Subjective:  Low Vt with PC.  Objective   Blood pressure 125/83, pulse 87, temperature 99.9 F (37.7 C), resp. rate (!) 32, weight 53.6 kg, SpO2 98 %.    Vent Mode: PCV FiO2 (%):  [40 %] 40 % Set Rate:  [32 bmp] 32 bmp PEEP:  [5 cmH20] 5 cmH20 Plateau Pressure:  [22 cmH20-25 cmH20] 22 cmH20   Intake/Output Summary (Last 24 hours) at 03/01/2022 1497 Last data filed at 03/01/2022 0800 Gross per 24 hour  Intake 2051.01 ml  Output 2875 ml  Net -823.99 ml   Filed Weights   02/27/22 0800  Weight: 53.6  kg   General - cachectic Eyes - pupils reactive ENT - trach site with tan secretions Cardiac - regular rate/rhythm, no murmur Chest - b/l crackles Abdomen - soft, non tender, G tube in place Extremities - decreased muscle bulk Skin - no rashes Neuro - not following commands  Resolved Hospital Problem list     Assessment & Plan:   Acute on chronic hypoxic/hypercapnic respiratory failure from HCAP. Tracheostomy status. - change to [facility] with goal Vt 7 cc/kg - goal SpO2 > 92% - f/u CXR intermittently - trach care  Pseudomonal HCAP. VRE and ESBL E coli UTI. - complete 5 days of carbapenem per ID recommendation from 0/26  Acute metabolic encephalopathy 2nd to sepsis. Hx of Parkinson disease, seizures. - continue keppra, valproic acid - RASS goal 0  History of hypothyroidism - continue Synthroid  Anemia of critical illness and chronic disease. - f/u CBC intermittently  History of paroxysmal atrial fibrillation. - No anticoagulation due to secondary hx of GI bleeds  Chronic hypotension. - Continue midodrine  Severe malnutrition. - tube feeds  Pressure injuries. - coccyx, stage 1, POA - wound care  Best Practice (right click and "Reselect all SmartList Selections" daily)   Diet/type: tubefeeds DVT prophylaxis: LMWH GI prophylaxis: PPI Lines: N/A Foley:  Yes, and it is still needed Code Status:  full code Last date of multidisciplinary goals of care discussion [tbd] 02/26/2022 daughter [name] called CODE STATUS confirmed full code at this time.   Labs       Latest Ref Rng &  Units 02/28/2022    3:45 AM 02/27/2022    4:54 AM 02/27/2022    3:25 AM  CMP  Glucose 70 - 99 mg/dL 124  93    BUN 8 - 23 mg/dL 26  21    Creatinine 0.61 - 1.24 mg/dL 0.31  <0.30    Sodium 135 - 145 mmol/L 136  138  138   Potassium 3.5 - 5.1 mmol/L 3.8  3.9  4.3   Chloride 98 - 111 mmol/L 95  89    CO2 22 - 32 mmol/L 37  41    Calcium 8.9 - 10.3 mg/dL 8.7  8.7         Latest  Ref Rng & Units 02/28/2022    3:45 AM 02/27/2022    4:54 AM 02/27/2022    3:25 AM  CBC  WBC 4.0 - 10.5 K/uL 14.2  18.7    Hemoglobin 13.0 - 17.0 g/dL 9.3  9.4  10.2   Hematocrit 39.0 - 52.0 % 31.8  30.7  30.0   Platelets 150 - 400 K/uL 411  355      ABG    Component Value Date/Time   PHART 7.456 (H) 02/27/2022 0325   PCO2ART 61.9 (H) 02/27/2022 0325   PO2ART 61 (L) 02/27/2022 0325   HCO3 43.7 (H) 02/27/2022 0325   TCO2 46 (H) 02/27/2022 0325   ACIDBASEDEF 2.0 05/30/2021 0052   O2SAT 91 02/27/2022 0325    CBG (last 3)  Recent Labs    02/28/22 2323 03/01/22 0348 03/01/22 0820  GLUCAP 161* 101* 123*    Critical care time: 35 minutes  Chesley Mires, MD [place] Pager - [phone] or [phone] 03/01/2022, 8:47 AM

## 2022-03-02 ENCOUNTER — Inpatient Hospital Stay (HOSPITAL_COMMUNITY): Payer: Medicare Other

## 2022-03-02 DIAGNOSIS — J189 Pneumonia, unspecified organism: Secondary | ICD-10-CM | POA: Diagnosis not present

## 2022-03-02 DIAGNOSIS — J9601 Acute respiratory failure with hypoxia: Secondary | ICD-10-CM | POA: Diagnosis not present

## 2022-03-02 LAB — GLUCOSE, CAPILLARY
Glucose-Capillary: 128 mg/dL — ABNORMAL HIGH (ref 70–99)
Glucose-Capillary: 130 mg/dL — ABNORMAL HIGH (ref 70–99)
Glucose-Capillary: 133 mg/dL — ABNORMAL HIGH (ref 70–99)
Glucose-Capillary: 163 mg/dL — ABNORMAL HIGH (ref 70–99)
Glucose-Capillary: 95 mg/dL (ref 70–99)
Glucose-Capillary: 97 mg/dL (ref 70–99)

## 2022-03-02 NOTE — Progress Notes (Signed)
NAME:  Austin Davis, MRN:  [id number], DOB:  1940/01/30, LOS: 4 ADMISSION DATE:  02/26/2022, CONSULTATION DATE: 02/26/2022 REFERRING MD: Emergency department physician, CHIEF COMPLAINT: Altered mental status low blood pressure  History of Present Illness:  82 year old male with multiple admissions to [facility] from [facility] with recurrent sepsis, infection with multidrug-resistant organisms, ESBL, Pseudomonas, Klebsiella, Enterococcus is recently been treated with Zyvox and meropenem.  He is poorly responsive on admission he does have a history of Parkinson disease and history of seizure disorders EEG has been undertaken he also had a CT of the head for completeness.  He is not requiring vasopressor support at this time.  He was given gentle fluid resuscitation antimicrobial therapy and admitted to the intensive care for further evaluation and treatment.  [name] was contacted and at this time due to family matters CODE STATUS remains full code.  Pertinent  Medical History  CHF, CAD, Tremor, GI bleed, Headache, MR, PAF  Significant Hospital Events: Including procedures, antibiotic start and stop dates in addition to other pertinent events   2/7 admit from [facility] for poor mental status, started on Linezolid and Meropenem 2/9 growing VRE and ESBL E. Coli in urine, respiratory culture with pseudomonas  2/10 low Vt with PC >> change to [facility] 2/11 PEG tube partially pulled out  Interim History / Subjective:  Remains on vent.  Objective   Blood pressure 97/71, pulse 90, temperature 99.7 F (37.6 C), resp. rate (!) 23, weight 53.6 kg, SpO2 95 %.    Vent Mode: PRVC FiO2 (%):  [30 %-40 %] 30 % Set Rate:  [26 bmp] 26 bmp Vt Set:  [480 mL] 480 mL PEEP:  [5 cmH20] 5 cmH20 Plateau Pressure:  [26 cmH20-29 cmH20] 26 cmH20   Intake/Output Summary (Last 24 hours) at 03/02/2022 1108 Last data filed at 03/02/2022 0800 Gross per 24 hour  Intake 1199.76 ml  Output 2310 ml   Net -1110.24 ml   Filed Weights   02/27/22 0800  Weight: 53.6 kg   General - tremulous Eyes - pupils reactive ENT - trach site clean Cardiac - regular rate/rhythm, no murmur Chest - b/l crackles Abdomen - soft, non tender, G tube poking out Extremities - decreased muscle bulk Skin - no rashes Neuro - not following commands  Resolved Hospital Problem list     Assessment & Plan:   Acute on chronic hypoxic/hypercapnic respiratory failure from HCAP. Tracheostomy status. - goal Vt 7 cc/kg - goal SpO2 > 92% - f/u CXR intermittently - trach care  Pseudomonal HCAP. VRE and ESBL E coli UTI. - complete 5 days of carbapenem per ID recommendation from 9/52  Acute metabolic encephalopathy 2nd to sepsis. Hx of Parkinson disease, seizures. - continue keppra, valproic acid - RASS goal 0  History of hypothyroidism - continue Synthroid  Anemia of critical illness and chronic disease. - f/u CBC intermittently  History of paroxysmal atrial fibrillation. - No anticoagulation due to secondary hx of GI bleeds  Chronic hypotension. - Continue midodrine  Severe malnutrition. - IR to assess G tube - continue NG tube for now  Pressure injuries. - coccyx, stage 1, POA - wound care  Disposition. - might be ready for transfer back to [facility] later this week  Best Practice (right click and "Reselect all SmartList Selections" daily)   Diet/type: tubefeeds DVT prophylaxis: LMWH GI prophylaxis: PPI Lines: N/A Foley:  Yes, and it is still needed Code Status:  full code Last date of multidisciplinary goals  of care discussion [tbd] 02/26/2022 daughter Austin Davis called CODE STATUS confirmed full code at this time.   Labs       Latest Ref Rng & Units 02/28/2022    3:45 AM 02/27/2022    4:54 AM 02/27/2022    3:25 AM  CMP  Glucose 70 - 99 mg/dL 124  93    BUN 8 - 23 mg/dL 26  21    Creatinine 0.61 - 1.24 mg/dL 0.31  <0.30    Sodium 135 - 145 mmol/L 136  138  138   Potassium 3.5  - 5.1 mmol/L 3.8  3.9  4.3   Chloride 98 - 111 mmol/L 95  89    CO2 22 - 32 mmol/L 37  41    Calcium 8.9 - 10.3 mg/dL 8.7  8.7         Latest Ref Rng & Units 02/28/2022    3:45 AM 02/27/2022    4:54 AM 02/27/2022    3:25 AM  CBC  WBC 4.0 - 10.5 K/uL 14.2  18.7    Hemoglobin 13.0 - 17.0 g/dL 9.3  9.4  10.2   Hematocrit 39.0 - 52.0 % 31.8  30.7  30.0   Platelets 150 - 400 K/uL 411  355      ABG    Component Value Date/Time   PHART 7.456 (H) 02/27/2022 0325   PCO2ART 61.9 (H) 02/27/2022 0325   PO2ART 61 (L) 02/27/2022 0325   HCO3 43.7 (H) 02/27/2022 0325   TCO2 46 (H) 02/27/2022 0325   ACIDBASEDEF 2.0 05/30/2021 0052   O2SAT 91 02/27/2022 0325    CBG (last 3)  Recent Labs    03/01/22 2323 03/02/22 0331 03/02/22 0753  GLUCAP 129* 95 97    Signature:  Chesley Mires, MD [place] Pager - [phone] or [phone] 03/02/2022, 11:08 AM

## 2022-03-02 NOTE — Plan of Care (Signed)
  Problem: Clinical Measurements: Goal: Diagnostic test results will improve Outcome: Progressing Goal: Cardiovascular complication will be avoided Outcome: Progressing   Problem: Activity: Goal: Risk for activity intolerance will decrease Outcome: Progressing   Problem: Nutrition: Goal: Adequate nutrition will be maintained Outcome: Progressing   Problem: Elimination: Goal: Will not experience complications related to urinary retention Outcome: Progressing   Problem: Safety: Goal: Ability to remain free from injury will improve Outcome: Progressing   Problem: Education: Goal: Knowledge of General Education information will improve Description: Including pain rating scale, medication(s)/side effects and non-pharmacologic comfort measures Outcome: Not Progressing   Problem: Health Behavior/Discharge Planning: Goal: Ability to manage health-related needs will improve Outcome: Not Progressing   Problem: Clinical Measurements: Goal: Ability to maintain clinical measurements within normal limits will improve Outcome: Not Progressing Goal: Will remain free from infection Outcome: Not Progressing Goal: Respiratory complications will improve Outcome: Not Progressing   Problem: Coping: Goal: Level of anxiety will decrease Outcome: Not Progressing   Problem: Elimination: Goal: Will not experience complications related to bowel motility Outcome: Not Progressing   Problem: Pain Managment: Goal: General experience of comfort will improve Outcome: Not Progressing   Problem: Skin Integrity: Goal: Risk for impaired skin integrity will decrease Outcome: Not Progressing

## 2022-03-02 NOTE — Plan of Care (Signed)
  Problem: Clinical Measurements: Goal: Diagnostic test results will improve Outcome: Progressing Goal: Cardiovascular complication will be avoided Outcome: Progressing   Problem: Activity: Goal: Risk for activity intolerance will decrease Outcome: Progressing   Problem: Nutrition: Goal: Adequate nutrition will be maintained Outcome: Progressing   Problem: Coping: Goal: Level of anxiety will decrease Outcome: Progressing   Problem: Elimination: Goal: Will not experience complications related to bowel motility Outcome: Progressing Goal: Will not experience complications related to urinary retention Outcome: Progressing   Problem: Pain Managment: Goal: General experience of comfort will improve Outcome: Progressing   Problem: Safety: Goal: Ability to remain free from injury will improve Outcome: Progressing   Problem: Education: Goal: Knowledge of General Education information will improve Description: Including pain rating scale, medication(s)/side effects and non-pharmacologic comfort measures Outcome: Not Progressing   Problem: Health Behavior/Discharge Planning: Goal: Ability to manage health-related needs will improve Outcome: Not Progressing   Problem: Clinical Measurements: Goal: Ability to maintain clinical measurements within normal limits will improve Outcome: Not Progressing Goal: Will remain free from infection Outcome: Not Progressing Goal: Respiratory complications will improve Outcome: Not Progressing   Problem: Skin Integrity: Goal: Risk for impaired skin integrity will decrease Outcome: Not Progressing

## 2022-03-02 NOTE — Progress Notes (Signed)
This RN found pt bed saturated with bile-like fluid. Leaking appears to be from PEG tube site. Tube also appears to be pulled out about 1.5 inches. [facility]link RN to notify CCM MD.

## 2022-03-02 NOTE — Progress Notes (Addendum)
eLink Physician-Brief Progress Note Patient Name: Austin Davis DOB: February 18, 1940 MRN: [id number]   Date of Service  03/02/2022  HPI/Events of Note  PEG tube looks as if it has been pulled out 1 1/2 inches. Leaking bile around insertion site. Concerned about PEG tube placement.   Bedside put the TF on hold. Does have meds due in a few hours. Please advise if those should be held too.   eICU Interventions  IR guided tubogram  In the interim, place ng tube for meds and feeds. Will need to be NPO after MN on 2/12   0433 - okay to place NG tube, not necessarily cortrak  Intervention Category Minor Interventions: Clinical assessment - ordering diagnostic tests  [name] 03/02/2022, 3:09 AM

## 2022-03-03 ENCOUNTER — Inpatient Hospital Stay (HOSPITAL_COMMUNITY): Payer: Medicare Other

## 2022-03-03 DIAGNOSIS — J9601 Acute respiratory failure with hypoxia: Secondary | ICD-10-CM | POA: Diagnosis not present

## 2022-03-03 LAB — CBC
HCT: 26 % — ABNORMAL LOW (ref 39.0–52.0)
Hemoglobin: 8.7 g/dL — ABNORMAL LOW (ref 13.0–17.0)
MCH: 27.4 pg (ref 26.0–34.0)
MCHC: 33.5 g/dL (ref 30.0–36.0)
MCV: 81.8 fL (ref 80.0–100.0)
Platelets: 435 10*3/uL — ABNORMAL HIGH (ref 150–400)
RBC: 3.18 MIL/uL — ABNORMAL LOW (ref 4.22–5.81)
RDW: 21.6 % — ABNORMAL HIGH (ref 11.5–15.5)
WBC: 15.2 10*3/uL — ABNORMAL HIGH (ref 4.0–10.5)
nRBC: 0 % (ref 0.0–0.2)

## 2022-03-03 LAB — BASIC METABOLIC PANEL
Anion gap: 9 (ref 5–15)
BUN: 42 mg/dL — ABNORMAL HIGH (ref 8–23)
CO2: 25 mmol/L (ref 22–32)
Calcium: 8.5 mg/dL — ABNORMAL LOW (ref 8.9–10.3)
Chloride: 108 mmol/L (ref 98–111)
Creatinine, Ser: <0.3 mg/dL — ABNORMAL LOW (ref 0.61–1.24)
Glucose, Bld: 101 mg/dL — ABNORMAL HIGH (ref 70–99)
Potassium: 3.6 mmol/L (ref 3.5–5.1)
Sodium: 142 mmol/L (ref 135–145)

## 2022-03-03 LAB — CULTURE, BLOOD (SINGLE): Culture: NO GROWTH

## 2022-03-03 LAB — GLUCOSE, CAPILLARY
Glucose-Capillary: 110 mg/dL — ABNORMAL HIGH (ref 70–99)
Glucose-Capillary: 105 mg/dL — ABNORMAL HIGH (ref 70–99)
Glucose-Capillary: 98 mg/dL (ref 70–99)
Glucose-Capillary: 158 mg/dL — ABNORMAL HIGH (ref 70–99)

## 2022-03-03 MED ORDER — DIATRIZOATE MEGLUMINE & SODIUM 66-10 % PO SOLN
30.0000 mL | Freq: Once | ORAL | Status: AC
  Administered 2022-03-03: 30 mL
  Filled 2022-03-03: qty 30

## 2022-03-03 MED ORDER — POTASSIUM CHLORIDE 20 MEQ PO PACK
40.0000 meq | PACK | Freq: Once | ORAL | Status: AC
  Administered 2022-03-03: 40 meq
  Filled 2022-03-03: qty 2

## 2022-03-03 NOTE — Progress Notes (Signed)
RNcalled report to [facility] RN at [facility]  This RN was asked to leave IV in L wrist for transport by receiving RN   1745 [facility] at bedside to transport Pt back to [place] called Pt's daughter to let her know about transport. Pt's daughter did not answer RN left message.

## 2022-03-03 NOTE — TOC Transition Note (Addendum)
Transition of Care [facility]) - CM/SW Discharge Note   Patient Details  Name: Austin Davis MRN: [id number] Date of Birth: 1940-08-08  Transition of Care [facility]) CM/SW Contact:  Benard Halsted, LCSW Phone Number: 03/03/2022, 4:03 PM   Clinical Narrative:    CSW made Austin Davis with [facility] aware of DC today. She repoered needing the DC summary by 4pm for medications. CSW faxed DC Summary to [facility] DON at f. [phone]. CSW placed Medical Necessity paperwork on hardchart. RN to assist with EMTALA form for [facility] and will call report to [phone] bed 309, receiving MD of Austin Davis. RN to contact [facility] at [phone] when ready for pickup.   CSW left voicemail for patient's daughter, Austin Davis, to make her aware of discharge.    Final next level of care: Skilled Nursing Facility Barriers to Discharge: Barriers Resolved   Patient Goals and CMS Choice CMS Medicare.gov Compare Post Acute Care list provided to:: Patient Represenative (must comment) (Daughter)    Discharge Placement     Existing PASRR number confirmed : 03/03/22          Patient chooses bed at:  ([facility]) Patient to be transferred to facility by: Austin Davis Name of family member notified: Austin Davis Patient and family notified of of transfer: 03/03/22  Discharge Plan and Services Additional resources added to the After Visit Summary for                                       Social Determinants of Health (SDOH) Interventions SDOH Screenings   Alcohol Screen: Low Risk  (04/12/2021)  Tobacco Use: Low Risk  (01/31/2022)     Readmission Risk Interventions    03/03/2022   12:04 PM 02/07/2022   11:58 AM 06/26/2021   11:10 AM  Readmission Risk Prevention Plan  Transportation Screening Complete Complete Complete  Medication Review Press photographer) Complete  Complete  PCP or Specialist appointment within 3-5 days of discharge Complete  Complete  HRI or Home Care Consult Complete  Complete  SW Recovery  Care/Counseling Consult Complete Complete Complete  Palliative Care Screening Not Applicable  Not Applicable  Skilled Nursing Facility Complete Complete Complete

## 2022-03-03 NOTE — NC FL2 (Addendum)
[place] LEVEL OF CARE FORM     IDENTIFICATION  Patient Name: Austin Davis Birthdate: 07/18/1940 Sex: male Admission Date (Current Location): 02/26/2022  [facility] and [place] Number:  Herbalist and Address:  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   HCAP (healthcare-associated pneumonia) 02/01/2022   Acute respiratory failure with hypoxia ([place]) 01/05/2022   Hyperkalemia 01/04/2022   Encephalopathy acute 01/04/2022   Seizure ([place]) 01/04/2022   Acute on chronic respiratory failure ([place]) 10/26/2021   Seizures ([place]) 06/26/2021   Tremor    Sepsis ([place]) 06/25/2021   Cardiac arrest due to respiratory disorder ([place]) 06/07/2021   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   Impaired quality of life 05/30/2021   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 05/15/2021   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[place]) 05/08/2021   Pneumonia 05/08/2021   Urinary tract infection associated with indwelling urethral catheter ([place]) 05/08/2021   Encephalopathy 05/08/2021   Pressure injury of skin 04/30/2021   Seizure disorder ([place]) 04/29/2021   Pneumothorax 04/11/2021   Ventilator dependent ([place])    Aspiration pneumonia ([place]) 03/25/2021   Parkinsonism 03/25/2021   Obstructive sleep apnea 10/24/2020   Chronic obstructive pulmonary disease ([place])  10/24/2020   Chronic heart failure with preserved ejection fraction ([place]) 10/24/2020   Unintentional weight loss 09/20/2020   SVT (supraventricular tachycardia) 09/20/2020   Protein-calorie malnutrition, severe 09/17/2020   Acute on chronic respiratory failure with hypoxia and hypercapnia ([place]) 09/16/2020   Failure to thrive in adult 09/16/2020   Stage 1 skin ulcer of sacral region ([place]) 09/16/2020   Chronic hypotension 06/30/2020   History of GI bleed 02/17/2017   Rectal bleeding 02/16/2017   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   Essential tremor 02/19/2015   Status post mitral valve annuloplasty and MAZE 2005 12/12/2014   HLD (hyperlipidemia) 05/05/2013   DOE (dyspnea on exertion) 04/22/2013   Fatigue 04/22/2013   Paroxysmal atrial fibrillation ([place]) 05/08/2012    Orientation RESPIRATION BLADDER Height & Weight      (intubated; follows commands)  Tracheostomy, Vent (Vent 30% FiO2; tracheostomy Siley XLT distal 86mm cuffed) Incontinent, Indwelling catheter Weight: 115 lb 8.3 oz (52.4 kg) Height:     BEHAVIORAL SYMPTOMS/MOOD NEUROLOGICAL BOWEL NUTRITION STATUS      Incontinent Feeding tube (See dc summary for formula details)  AMBULATORY STATUS COMMUNICATION OF NEEDS Skin   Extensive Assist Non-Verbally PU Stage and Appropriate Care (Stage I on coccyx and sacrum; Stage II on buttocks w/foam dressing)                       Personal Care Assistance Level of Assistance  Bathing, Feeding, Dressing Bathing Assistance: Maximum assistance Feeding assistance: Maximum assistance Dressing Assistance: Maximum assistance     Functional Limitations Info  SPECIAL CARE FACTORS FREQUENCY                       Contractures Contractures Info: Not present    Additional Factors Info  Code Status, Allergies, Isolation Precautions Code Status Info: Full Allergies Info: Prednisone, Cortisone     Isolation Precautions Info: OMDRO; ESBL; VRE;MRSA      Current Medications (03/03/2022):  This is the current hospital active medication list Current Facility-Administered Medications  Medication Dose Route Frequency Provider Last Rate Last Admin   0.9 %  sodium chloride infusion   Intravenous PRN Jacky Kindle, MD   Stopped at 02/28/22 0912   acetaminophen (TYLENOL) tablet 650 mg  650 mg Per Tube Q6H PRN Gleason, Otilio Carpen, PA-C   650 mg at 03/02/22 1950   albuterol (PROVENTIL) (2.5 MG/3ML) 0.083% nebulizer solution 2.5 mg  2.5 mg Nebulization TID Jacky Kindle, MD   2.5 mg at 03/03/22 0717   ALPRAZolam (XANAX) tablet 0.5 mg  0.5 mg Per Tube TID PRN Jacky Kindle, MD   0.5 mg at 03/01/22 2348   Chlorhexidine Gluconate Cloth 2 % PADS 6 each  6 each Topical Q0600 Jacky Kindle, MD   6 each at 03/02/22 0535   docusate sodium (COLACE) capsule 100 mg  100 mg Oral BID PRN Minor, Grace Bushy, NP       enoxaparin (LOVENOX) injection 40 mg  40 mg Subcutaneous Daily Gleason, Otilio Carpen, PA-C   40 mg at 03/03/22 1143   feeding supplement (OSMOLITE 1.5 CAL) liquid 1,000 mL  1,000 mL Per Tube Continuous Jacky Kindle, MD 60 mL/hr at 03/03/22 0900 Infusion Verify at 03/03/22 0900   hydrOXYzine (ATARAX) tablet 25 mg  25 mg Per Tube Q6H Chand, Currie Paris, MD   25 mg at 03/03/22 1143   levETIRAcetam (KEPPRA) 100 MG/ML solution 1,000 mg  1,000 mg Per Tube BID Jacky Kindle, MD   1,000 mg at 03/03/22 1143   levothyroxine (SYNTHROID) tablet 25 mcg  25 mcg Per Tube Daily Jacky Kindle, MD   25 mcg at 03/03/22 0547   midodrine (PROAMATINE) tablet 10 mg  10 mg Per Tube TID WC Jacky Kindle, MD   10 mg at 03/03/22 1143   multivitamin with minerals tablet 1 tablet  1 tablet Per Tube Daily Jacky Kindle, MD   1 tablet at 03/03/22 1143   nutrition supplement (JUVEN) (JUVEN) powder packet 1 packet  1 packet Per Tube BID BM Jacky Kindle, MD   1 packet at 03/03/22 1143   Oral care mouth rinse  15 mL Mouth Rinse Q2H Chand, Sudham, MD   15 mL at 03/03/22 1144   Oral care mouth rinse  15  mL Mouth Rinse PRN Jacky Kindle, MD       pantoprazole (PROTONIX) injection 40 mg  40 mg Intravenous QHS Minor, Grace Bushy, NP   40 mg at 03/02/22 2142   polyethylene glycol (MIRALAX / GLYCOLAX) packet 17 g  17 g Oral Daily PRN Minor, Grace Bushy, NP       valproic acid (DEPAKENE) 250 MG/5ML solution 250 mg  250 mg Per Tube TID Jacky Kindle, MD   250 mg at 03/03/22 1143   [START ON 03/04/2022] valproic acid (DEPAKENE) 250 MG/5ML solution 250 mg  250 mg Per Tube BID Jacky Kindle, MD         Discharge Medications: Please see discharge summary for a list of discharge medications.  Relevant Imaging Results:  Relevant Lab Results:   Additional  Information SSN: [id number]  Benard Halsted, LCSW

## 2022-03-03 NOTE — Progress Notes (Signed)
NAME:  Austin Davis, MRN:  [id number], DOB:  1940/03/01, LOS: 5 ADMISSION DATE:  02/26/2022, CONSULTATION DATE: 02/26/2022 REFERRING MD: Emergency department physician, CHIEF COMPLAINT: Altered mental status low blood pressure  History of Present Illness:  82 year old male with multiple admissions to [facility] from [facility] with recurrent sepsis, infection with multidrug-resistant organisms, ESBL, Pseudomonas, Klebsiella, Enterococcus is recently been treated with Zyvox and meropenem.  He is poorly responsive on admission he does have a history of Parkinson disease and history of seizure disorders EEG has been undertaken he also had a CT of the head for completeness.  He is not requiring vasopressor support at this time.  He was given gentle fluid resuscitation antimicrobial therapy and admitted to the intensive care for further evaluation and treatment.  His daughter Einar Nolasco was contacted and at this time due to family matters CODE STATUS remains full code.  Pertinent  Medical History  CHF, CAD, Tremor, GI bleed, Headache, MR, PAF  Significant Hospital Events: Including procedures, antibiotic start and stop dates in addition to other pertinent events   2/7 admit from [facility] for poor mental status, started on Linezolid and Meropenem 2/9 growing VRE and ESBL E. Coli in urine, respiratory culture with pseudomonas  2/10 low Vt with PC >> change to [facility] 2/11 PEG tube partially pulled out  Interim History / Subjective:   Remains on vent. Awaiting peg tube placement.   Objective   Blood pressure 106/72, pulse 78, temperature 99.7 F (37.6 C), resp. rate (!) 23, weight 52.4 kg, SpO2 97 %.    Vent Mode: PRVC FiO2 (%):  [30 %] 30 % Set Rate:  [26 bmp] 26 bmp Vt Set:  [480 mL] 480 mL PEEP:  [5 cmH20] 5 cmH20 Plateau Pressure:  [25 cmH20-32 cmH20] 28 cmH20   Intake/Output Summary (Last 24 hours) at 03/03/2022 4193 Last data filed at 03/03/2022 0700 Gross per 24 hour  Intake  1360.25 ml  Output 1325 ml  Net 35.25 ml   Filed Weights   02/27/22 0800 03/03/22 0600  Weight: 53.6 kg 52.4 kg   General - chronically ill appearing  Eyes - tracking  ENT - significant amount of secretions  Cardiac - RRR, s1 s2  Chest - BL rhonchi present  Abdomen - soft nt nd  Extremities - no edema  Skin - no rash  Neuro - will not follow commands   Resolved Hospital Problem list     Assessment & Plan:   Acute on chronic hypoxic/hypercapnic respiratory failure from HCAP. Tracheostomy status. 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 - I reviewed several past cxrs this has been going on for sometime. I suspect he will need outpatient ent evaluation and possible tracheal intervention P: Continue trach care  Adult vent protocol   Pseudomonal HCAP. VRE and ESBL E coli UTI. P: Complete 5 days of carbapenem  Acute metabolic encephalopathy 2nd to sepsis. Hx of Parkinson disease, seizures. P: Continue keppra, valproic acid  Rass goal 0   History of hypothyroidism - continue synthroid   Anemia of critical illness and chronic disease. - f/u CBC intermittently  History of paroxysmal atrial fibrillation. - No anticoagulation due to secondary hx of GI bleeds  Chronic hypotension. - Continue midodrine   Severe malnutrition. - IR to assess PEG tube issues  - once corrected he can go back to [facility]   Pressure injuries. - coccyx, stage 1, POA - wound care  Disposition. - once peg tube issues corrected can go back  to [facility]   Best Practice (right click and "Reselect all SmartList Selections" daily)   Diet/type: tubefeeds DVT prophylaxis: LMWH GI prophylaxis: PPI Lines: N/A Foley:  Yes, and it is still needed Code Status:  full code Last date of multidisciplinary goals of care discussion [ on going discussions with family]  Labs       Latest Ref Rng & Units 03/03/2022    4:13 AM 02/28/2022    3:45 AM 02/27/2022    4:54 AM  CMP  Glucose 70 -  99 mg/dL 101  124  93   BUN 8 - 23 mg/dL 42  26  21   Creatinine 0.61 - 1.24 mg/dL <0.30  0.31  <0.30   Sodium 135 - 145 mmol/L 142  136  138   Potassium 3.5 - 5.1 mmol/L 3.6  3.8  3.9   Chloride 98 - 111 mmol/L 108  95  89   CO2 22 - 32 mmol/L 25  37  41   Calcium 8.9 - 10.3 mg/dL 8.5  8.7  8.7        Latest Ref Rng & Units 03/03/2022    4:13 AM 02/28/2022    3:45 AM 02/27/2022    4:54 AM  CBC  WBC 4.0 - 10.5 K/uL 15.2  14.2  18.7   Hemoglobin 13.0 - 17.0 g/dL 8.7  9.3  9.4   Hematocrit 39.0 - 52.0 % 26.0  31.8  30.7   Platelets 150 - 400 K/uL 435  411  355     ABG    Component Value Date/Time   PHART 7.456 (H) 02/27/2022 0325   PCO2ART 61.9 (H) 02/27/2022 0325   PO2ART 61 (L) 02/27/2022 0325   HCO3 43.7 (H) 02/27/2022 0325   TCO2 46 (H) 02/27/2022 0325   ACIDBASEDEF 2.0 05/30/2021 0052   O2SAT 91 02/27/2022 0325    CBG (last 3)  Recent Labs    03/02/22 2336 03/03/22 0350 03/03/22 0734  GLUCAP 128* 110* 105*     Garner Nash, DO [facility] 03/03/2022 9:09 AM

## 2022-03-03 NOTE — Discharge Summary (Signed)
Physician Discharge Summary         Patient ID: Austin Davis MRN: [id number] DOB/AGE: 82/19/1942 82 y.o.  Admit date: 02/26/2022 Discharge date: 03/03/2022  Discharge Diagnoses:    Active Hospital Problems   Diagnosis Date Noted   Acute hypoxic respiratory failure ([place]) 02/26/2022    Resolved Hospital Problems  No resolved problems to display.      Discharge summary    82 year old male with multiple admissions to [facility] from [facility] with recurrent sepsis, infection with multidrug-resistant organisms, ESBL, Pseudomonas, Klebsiella, Enterococcus is recently been treated with Zyvox and meropenem. He is poorly responsive on admission he does have a history of Parkinson disease and history of seizure disorders EEG has been undertaken he also had a CT of the head for completeness. He is not requiring vasopressor support at this time. He was given gentle fluid resuscitation antimicrobial therapy and admitted to the intensive care for further evaluation and treatment.   Discharge Plan by Active Problems     Acute on chronic hypoxic/hypercapnic respiratory failure from HCAP. Tracheostomy status. 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 - I reviewed several past cxrs this has been going on for sometime. I suspect he will need outpatient ent evaluation and possible tracheal intervention P: Continue trach care  Adult vent protocol    Pseudomonal HCAP. VRE and ESBL E coli UTI. P: Completed 5 days of carbapenem   Acute metabolic encephalopathy 2nd to sepsis. Hx of Parkinson disease, seizures. P: Continue keppra, valproic acid  Rass goal 0    History of hypothyroidism - continue synthroid    Anemia of critical illness and chronic disease. - stable   History of paroxysmal atrial fibrillation. - No anticoagulation due to secondary hx of GI bleeds   Chronic hypotension. - Continue midodrine    Severe malnutrition. -PEG tube in appropriate  position per study, continue TF   Pressure injuries. - coccyx, stage 1, POA - wound care   Disposition. - back to [facility]   [facility] tests/ studies  2/12 PEG tube location appropriate 2/7 CT head: Stable and negative for age noncontrast CT appearance of the brain. 2/7 EEG: This study is suggestive of moderate to severe diffuse encephalopathy, nonspecific etiology. No seizures or epileptiform discharges were seen throughout the recordin      Procedures   None  Culture data/antimicrobials     Austin Davis, Austin Davis #[id number] (CSN: [id number]) (82 y.o. M) (Adm: 02/26/22) [id number] Microbiology Results   Procedure Component Value Units Date/Time  Culture, blood (single) [[id number]] Collected: 02/26/22 0400  Order Status: Completed Specimen: Blood Updated: 03/03/22 1147   Specimen Description BLOOD SITE NOT SPECIFIED   Special Requests BOTTLES DRAWN AEROBIC AND ANAEROBIC Blood Culture results may not be optimal due to an excessive volume of blood received in culture bottles   Culture --   NO GROWTH 5 DAYS Performed at [facility] [address] [place], [place] [address]   Report Status 03/03/2022 FINAL  Culture, Respiratory w Gram Stain (tracheal aspirate) [[id number]]  Collected: 02/26/22 0845  Order Status: Completed Specimen: Respiratory from Tracheal Aspirate Updated: 02/28/22 0931   Specimen Description TRACHEAL ASPIRATE   Special Requests NONE   Gram Stain --   ABUNDANT WBC PRESENT, PREDOMINANTLY PMN RARE GRAM NEGATIVE RODS Performed at [facility], [place] [address] [place], [place] [address]   Culture MODERATE PSEUDOMONAS AERUGINOSA   Report Status 02/28/2022 FINAL   Organism ID, Bacteria PSEUDOMONAS AERUGINOSA  Susceptibility  Pseudomonas aeruginosa (  ZZ00)  Antibiotic Interpretation Microscan Method Status   CEFTAZIDIME Sensitive 4 SENSITIVE MIC Final   CIPROFLOXACIN Sensitive <=0.25 SENSITIVE MIC Final   GENTAMICIN Sensitive  2 SENSITIVE MIC Final   IMIPENEM Sensitive 1 SENSITIVE MIC Final   PIP/TAZO Sensitive <=4 SENSITIVE MIC Final   CEFEPIME Sensitive 2 SENSITIVE MIC Final   Susceptibility Comments  MODERATE PSEUDOMONAS AERUGINOSA      Urine Culture (for pregnant, neutropenic or urologic patients or patients with an indwelling urinary catheter) [[id number]] (Abnormal)  Collected: 02/26/22 1023  Order Status: Completed Specimen: Urine, Catheterized Updated: 02/28/22 0758   Specimen Description URINE, CATHETERIZED   Special Requests --   NONE Performed at [facility], [place] [address] [place], [place] [address]   Culture -- Abnormal    >=100,000 COLONIES/mL ESCHERICHIA COLI >=100,000 COLONIES/mL ENTEROCOCCUS FAECALIS Confirmed Extended Spectrum Beta-Lactamase Producer (ESBL).  In bloodstream infections from ESBL organisms, carbapenems are preferred over piperacillin/tazobactam. They are shown to have a lower risk of mortality. VANCOMYCIN RESISTANT ENTEROCOCCUS ISOLATED  Abnormal    Report Status 02/28/2022 FINAL   Organism ID, Bacteria ESCHERICHIA COLI Abnormal    Organism ID, Bacteria ENTEROCOCCUS FAECALIS Abnormal   Susceptibility  Escherichia coli (ZZ00)  Antibiotic Interpretation Microscan Method Status   AMPICILLIN Resistant >=32 RESISTANT MIC Final   CEFAZOLIN Resistant >=64 RESISTANT MIC Final   CEFEPIME Resistant >=32 RESISTANT MIC Final   CEFTRIAXONE Resistant >=64 RESISTANT MIC Final   CIPROFLOXACIN Resistant >=4 RESISTANT MIC Final   GENTAMICIN Sensitive <=1 SENSITIVE MIC Final   IMIPENEM Sensitive <=0.25 SENSITIVE MIC Final   NITROFURANTOIN Sensitive <=16 SENSITIVE MIC Final   TRIMETH/SULFA Sensitive <=20 SENSITIVE MIC Final   AMPICILLIN/SULBACTAM Resistant >=32 RESISTANT MIC Final   PIP/TAZO Sensitive 8 SENSITIVE MIC Final   Susceptibility Comments  >=100,000 COLONIES/mL ESCHERICHIA COLI   Enterococcus faecalis (ZZ01)  Antibiotic Interpretation Microscan Method Status    AMPICILLIN Sensitive <=2 SENSITIVE MIC Final   NITROFURANTOIN Sensitive <=16 SENSITIVE MIC Final   VANCOMYCIN Resistant >=32 RESISTANT MIC Final   LINEZOLID Sensitive 2 SENSITIVE MIC Final    Susceptibility Comments  >=100,000 COLONIES/mL ENTEROCOCCUS FAECALIS   Condensed View      MRSA Next Gen by PCR, Nasal [[id number]] Collected: 02/26/22 0816  Order Status: Completed Specimen: Nasal Swab from Nasal Mucosa Updated: 02/26/22 1039   MRSA by PCR Next Gen NOT DETECTED   Comment: (NOTE) The GeneXpert MRSA Assay (FDA approved for NASAL specimens only), is one component of a comprehensive MRSA colonization surveillance program. It is not intended to diagnose MRSA infection nor to guide or monitor treatment for MRSA infections. Test performance is not FDA approved in patients less than 20 years old. Performed at [facility], [place] [address] [place], [place] [address]    Culture, blood (Routine X 2) w Reflex to ID Panel [[id number]] Collected: 02/26/22 0850  Order Status: Canceled Specimen: Blood from Peripheral   Culture, blood (Routine X 2) w Reflex to ID Panel [[id number]] Collected: 02/26/22 0845  Order Status: Canceled Specimen: Blood from Peripheral      Consults      Discharge Exam: BP 125/71   Pulse 68   Temp 100 F (37.8 C)   Resp (!) 26   Wt 52.4 kg   SpO2 97%   BMI 16.58 kg/m    Labs at discharge   Lab Results  Component Value Date   CREATININE <0.30 (L) 03/03/2022   BUN 42 (H) 03/03/2022   NA 142 03/03/2022  K 3.6 03/03/2022   CL 108 03/03/2022   CO2 25 03/03/2022   Lab Results  Component Value Date   WBC 15.2 (H) 03/03/2022   HGB 8.7 (L) 03/03/2022   HCT 26.0 (L) 03/03/2022   MCV 81.8 03/03/2022   PLT 435 (H) 03/03/2022   Lab Results  Component Value Date   ALT 41 02/26/2022   AST 20 02/26/2022   ALKPHOS 158 (H) 02/26/2022   BILITOT 0.2 (L) 02/26/2022   Lab Results  Component Value Date   INR 1.3 (H) 01/04/2022   INR 1.2  10/26/2021   INR 1.0 05/08/2021    Current radiological studies    DG ABDOMEN PEG TUBE LOCATION  Result Date: 03/03/2022 CLINICAL DATA:  Peg tube placement.  Contrast. EXAM: ABDOMEN - 1 VIEW COMPARISON:  Multiple prior studies, including most recent 03/02/2022 FINDINGS: Enteric tube remains in place. A PEG tube has been placed, tip overlying the RIGHT UPPER QUADRANT. Contrast has been injected. There has been opacification of normal appearing proximal small bowel loops. Inferior vena cava filter is seen to the RIGHT of the lumbar spine, stable in appearance. IMPRESSION: Interval placement of PEG tube, overlying the RIGHT UPPER QUADRANT. Opacification of normal appearing proximal small bowel loops. Electronically Signed   By: Nolon Nations M.D.   On: 03/03/2022 13:00   DG Chest Port 1 View  Result Date: 03/03/2022 CLINICAL DATA:  Healthcare associated pneumonia. EXAM: PORTABLE CHEST 1 VIEW COMPARISON:  02/26/2022 and CT chest 05/08/2021. FINDINGS: Tracheostomy is midline. Associated balloon appears overinflated, dilating the trachea. Nasogastric tube is followed into the stomach with the tip projecting beyond the inferior margin of the image. Heart size stable. Thoracic aorta is uncoiled. Mild patchy interstitial prominence and indistinctness, slightly improved from 02/26/2022. There may be tiny bilateral pleural effusions. IMPRESSION: 1. Tracheostomy balloon appears overinflated, expanding the trachea. 2. Slight improvement in interstitial prominence and indistinctness, without complete resolution, due to improving edema or viral/atypical pneumonia. 3. Possible tiny bilateral pleural effusions. Electronically Signed   By: Lorin Picket M.D.   On: 03/03/2022 08:14   DG Abd Portable 1V  Result Date: 03/02/2022 CLINICAL DATA:  NG tube placement. EXAM: PORTABLE ABDOMEN - 1 VIEW COMPARISON:  Radiograph 01/06/2022 FINDINGS: Tip and side port of the enteric tube below the diaphragm in the stomach. IVC  filter is in place, similarly oriented to prior exam, look oriented to the right. Surgical clips in the upper abdomen. IMPRESSION: Tip and side port of the enteric tube below the diaphragm in the stomach. Electronically Signed   By: Keith Rake M.D.   On: 03/02/2022 08:58    Disposition:     There are no questions and answers to display.          Allergies as of 03/03/2022       Reactions   Prednisone Other (See Comments)   Listed as allergy on MAR Unknown reaction   Cortisone Other (See Comments)   Listed as allergy per MAR Unknown reaction        Medication List     STOP taking these medications    LORazepam 2 MG/ML injection Commonly known as: ATIVAN   meropenem 1 g in sodium chloride 0.9 % 100 mL       TAKE these medications    acetaminophen 325 MG tablet Commonly known as: TYLENOL Place 650 mg into feeding tube every 8 (eight) hours as needed for mild pain or fever.   ALPRAZolam 0.5 MG tablet Commonly known as:  XANAX Place 0.5 mg into feeding tube every 8 (eight) hours as needed for anxiety.   CULTURELLE PO Place 10 Billion Cells into feeding tube daily.   esomeprazole 40 MG capsule Commonly known as: NEXIUM 40 mg daily. Via feeding tube   feeding supplement (OSMOLITE 1.5 CAL) Liqd Place 55 mL/hr into feeding tube See admin instructions. 55 ml/hr for 22-24 hours = 1815-1980 kcal & 1520-1603 ml   Fish Oil 1000 MG Caps Place 1,000 mg into feeding tube daily.   fluticasone 50 MCG/ACT nasal spray Commonly known as: FLONASE Place 1 spray into both nostrils every 6 (six) hours as needed for allergies or rhinitis.   folic acid 1 MG tablet Commonly known as: FOLVITE Take 1 tablet (1 mg total) by mouth daily. What changed: how to take this   furosemide 20 MG tablet Commonly known as: LASIX Place 1 tablet (20 mg total) into feeding tube daily as needed for fluid or edema. What changed: when to take this   hydrOXYzine 50 MG tablet Commonly  known as: ATARAX Place 50 mg into feeding tube every 6 (six) hours.   hydrOXYzine 25 MG tablet Commonly known as: ATARAX Place 25 mg into feeding tube every 6 (six) hours as needed for anxiety.   lactulose 10 GM/15ML solution Commonly known as: CHRONULAC Place 20 g into feeding tube every 12 (twelve) hours as needed (constipation).   levalbuterol 1.25 MG/3ML nebulizer solution Commonly known as: XOPENEX Take 1.25 mg by nebulization every 6 (six) hours.   levETIRAcetam 100 MG/ML solution Commonly known as: KEPPRA Place 1,000 mg into feeding tube 2 (two) times daily.   levothyroxine 25 MCG tablet Commonly known as: SYNTHROID Place 25 mcg into feeding tube daily.   Melatonin 3 MG Tbdp Place 6 mg into feeding tube at bedtime.   metoprolol tartrate 25 MG tablet Commonly known as: LOPRESSOR Place 25 mg into feeding tube as needed (AFIB. Do not administer if low BP, systolic <46).   midodrine 10 MG tablet Commonly known as: PROAMATINE Place 1 tablet (10 mg total) into feeding tube 3 (three) times daily with meals. What changed: when to take this   multivitamins with iron Tabs tablet Take 1 tablet per tube daily   polyethylene glycol 17 g packet Commonly known as: MIRALAX / GLYCOLAX Place 17 g into feeding tube every other day.   propranolol 10 MG tablet Commonly known as: INDERAL Place 10 mg into feeding tube daily as needed (Tremor. Hold if HR is less than 60 or SBP less than 90).   sertraline 50 MG tablet Commonly known as: ZOLOFT Place 50 mg into feeding tube daily.   sterile water for irrigation Irrigate with 100 mLs as directed See admin instructions. 100 ml H2O flush every 4 hours   tamsulosin 0.4 MG Caps capsule Commonly known as: FLOMAX 0.4 mg daily. Via feeding tube   valproic acid 250 MG/5ML solution Commonly known as: DEPAKENE Place 250 mg into feeding tube 2 (two) times daily.         Follow-up appointment    At [facility]  Discharge Condition:     stable   Signed: Otilio Carpen Shanin Szymanowski 03/03/2022, 3:27 PM

## 2022-03-04 DIAGNOSIS — R0989 Other specified symptoms and signs involving the circulatory and respiratory systems: Secondary | ICD-10-CM | POA: Diagnosis not present

## 2022-03-04 DIAGNOSIS — R0602 Shortness of breath: Secondary | ICD-10-CM | POA: Diagnosis not present

## 2022-03-12 ENCOUNTER — Emergency Department (HOSPITAL_COMMUNITY): Payer: Medicare Other

## 2022-03-12 ENCOUNTER — Inpatient Hospital Stay (HOSPITAL_COMMUNITY)
Admission: EM | Admit: 2022-03-12 | Discharge: 2022-03-18 | DRG: 393 | Disposition: A | Discharge status: Skilled Nursing Facility | Payer: Medicare Other | Attending: Internal Medicine | Admitting: Internal Medicine

## 2022-03-12 ENCOUNTER — Encounter (HOSPITAL_COMMUNITY): Payer: Self-pay

## 2022-03-12 DIAGNOSIS — J189 Pneumonia, unspecified organism: Secondary | ICD-10-CM | POA: Diagnosis not present

## 2022-03-12 DIAGNOSIS — B965 Pseudomonas (aeruginosa) (mallei) (pseudomallei) as the cause of diseases classified elsewhere: Secondary | ICD-10-CM | POA: Diagnosis present

## 2022-03-12 DIAGNOSIS — J9811 Atelectasis: Secondary | ICD-10-CM | POA: Diagnosis present

## 2022-03-12 DIAGNOSIS — K9422 Gastrostomy infection: Principal | ICD-10-CM | POA: Diagnosis present

## 2022-03-12 DIAGNOSIS — E7220 Disorder of urea cycle metabolism, unspecified: Secondary | ICD-10-CM | POA: Diagnosis not present

## 2022-03-12 DIAGNOSIS — E039 Hypothyroidism, unspecified: Secondary | ICD-10-CM | POA: Diagnosis not present

## 2022-03-12 DIAGNOSIS — K6389 Other specified diseases of intestine: Secondary | ICD-10-CM | POA: Diagnosis not present

## 2022-03-12 DIAGNOSIS — Z8249 Family history of ischemic heart disease and other diseases of the circulatory system: Secondary | ICD-10-CM

## 2022-03-12 DIAGNOSIS — Z79899 Other long term (current) drug therapy: Secondary | ICD-10-CM

## 2022-03-12 DIAGNOSIS — G250 Essential tremor: Secondary | ICD-10-CM | POA: Diagnosis not present

## 2022-03-12 DIAGNOSIS — Z1152 Encounter for screening for COVID-19: Secondary | ICD-10-CM

## 2022-03-12 DIAGNOSIS — K9423 Gastrostomy malfunction: Secondary | ICD-10-CM | POA: Diagnosis not present

## 2022-03-12 DIAGNOSIS — A419 Sepsis, unspecified organism: Secondary | ICD-10-CM | POA: Diagnosis not present

## 2022-03-12 DIAGNOSIS — T85528A Displacement of other gastrointestinal prosthetic devices, implants and grafts, initial encounter: Secondary | ICD-10-CM

## 2022-03-12 DIAGNOSIS — Z825 Family history of asthma and other chronic lower respiratory diseases: Secondary | ICD-10-CM

## 2022-03-12 DIAGNOSIS — I2510 Atherosclerotic heart disease of native coronary artery without angina pectoris: Secondary | ICD-10-CM | POA: Diagnosis present

## 2022-03-12 DIAGNOSIS — N320 Bladder-neck obstruction: Secondary | ICD-10-CM | POA: Diagnosis present

## 2022-03-12 DIAGNOSIS — R000 Tachycardia, unspecified: Secondary | ICD-10-CM | POA: Diagnosis not present

## 2022-03-12 DIAGNOSIS — Z820 Family history of epilepsy and other diseases of the nervous system: Secondary | ICD-10-CM

## 2022-03-12 DIAGNOSIS — R933 Abnormal findings on diagnostic imaging of other parts of digestive tract: Secondary | ICD-10-CM | POA: Diagnosis not present

## 2022-03-12 DIAGNOSIS — K567 Ileus, unspecified: Secondary | ICD-10-CM | POA: Diagnosis not present

## 2022-03-12 DIAGNOSIS — I5022 Chronic systolic (congestive) heart failure: Secondary | ICD-10-CM | POA: Diagnosis present

## 2022-03-12 DIAGNOSIS — R569 Unspecified convulsions: Principal | ICD-10-CM

## 2022-03-12 DIAGNOSIS — E785 Hyperlipidemia, unspecified: Secondary | ICD-10-CM | POA: Diagnosis not present

## 2022-03-12 DIAGNOSIS — J9621 Acute and chronic respiratory failure with hypoxia: Secondary | ICD-10-CM | POA: Diagnosis present

## 2022-03-12 DIAGNOSIS — N390 Urinary tract infection, site not specified: Secondary | ICD-10-CM | POA: Diagnosis present

## 2022-03-12 DIAGNOSIS — E876 Hypokalemia: Secondary | ICD-10-CM | POA: Diagnosis present

## 2022-03-12 DIAGNOSIS — R140 Abdominal distension (gaseous): Secondary | ICD-10-CM | POA: Diagnosis not present

## 2022-03-12 DIAGNOSIS — Z888 Allergy status to other drugs, medicaments and biological substances status: Secondary | ICD-10-CM

## 2022-03-12 DIAGNOSIS — Y95 Nosocomial condition: Secondary | ICD-10-CM | POA: Diagnosis present

## 2022-03-12 DIAGNOSIS — D638 Anemia in other chronic diseases classified elsewhere: Secondary | ICD-10-CM | POA: Diagnosis present

## 2022-03-12 DIAGNOSIS — R0602 Shortness of breath: Secondary | ICD-10-CM | POA: Diagnosis not present

## 2022-03-12 DIAGNOSIS — G9341 Metabolic encephalopathy: Secondary | ICD-10-CM | POA: Diagnosis not present

## 2022-03-12 DIAGNOSIS — E871 Hypo-osmolality and hyponatremia: Secondary | ICD-10-CM | POA: Diagnosis not present

## 2022-03-12 DIAGNOSIS — Z9911 Dependence on respirator [ventilator] status: Secondary | ICD-10-CM

## 2022-03-12 DIAGNOSIS — Z823 Family history of stroke: Secondary | ICD-10-CM

## 2022-03-12 DIAGNOSIS — R109 Unspecified abdominal pain: Secondary | ICD-10-CM | POA: Diagnosis not present

## 2022-03-12 DIAGNOSIS — Z79890 Hormone replacement therapy (postmenopausal): Secondary | ICD-10-CM

## 2022-03-12 DIAGNOSIS — G20A1 Parkinson's disease without dyskinesia, without mention of fluctuations: Secondary | ICD-10-CM | POA: Diagnosis present

## 2022-03-12 DIAGNOSIS — G40909 Epilepsy, unspecified, not intractable, without status epilepticus: Secondary | ICD-10-CM | POA: Diagnosis not present

## 2022-03-12 DIAGNOSIS — I480 Paroxysmal atrial fibrillation: Secondary | ICD-10-CM | POA: Diagnosis not present

## 2022-03-12 DIAGNOSIS — G4733 Obstructive sleep apnea (adult) (pediatric): Secondary | ICD-10-CM | POA: Diagnosis present

## 2022-03-12 DIAGNOSIS — K5939 Other megacolon: Secondary | ICD-10-CM | POA: Diagnosis present

## 2022-03-12 DIAGNOSIS — Z4659 Encounter for fitting and adjustment of other gastrointestinal appliance and device: Secondary | ICD-10-CM | POA: Diagnosis not present

## 2022-03-12 DIAGNOSIS — Z1624 Resistance to multiple antibiotics: Secondary | ICD-10-CM | POA: Diagnosis not present

## 2022-03-12 DIAGNOSIS — N200 Calculus of kidney: Secondary | ICD-10-CM | POA: Diagnosis not present

## 2022-03-12 DIAGNOSIS — R4182 Altered mental status, unspecified: Secondary | ICD-10-CM | POA: Diagnosis not present

## 2022-03-12 DIAGNOSIS — I700 Atherosclerosis of aorta: Secondary | ICD-10-CM | POA: Diagnosis not present

## 2022-03-12 HISTORY — DX: Unspecified convulsions: R56.9

## 2022-03-12 LAB — I-STAT VENOUS BLOOD GAS, ED
Acid-Base Excess: 11 mmol/L — ABNORMAL HIGH (ref 0.0–2.0)
Bicarbonate: 37.1 mmol/L — ABNORMAL HIGH (ref 20.0–28.0)
Calcium, Ion: 1.06 mmol/L — ABNORMAL LOW (ref 1.15–1.40)
HCT: 35 % — ABNORMAL LOW (ref 39.0–52.0)
Hemoglobin: 11.9 g/dL — ABNORMAL LOW (ref 13.0–17.0)
O2 Saturation: 86 %
Potassium: 5.1 mmol/L (ref 3.5–5.1)
Sodium: 133 mmol/L — ABNORMAL LOW (ref 135–145)
TCO2: 39 mmol/L — ABNORMAL HIGH (ref 22–32)
pCO2, Ven: 53.9 mmHg (ref 44–60)
pH, Ven: 7.446 — ABNORMAL HIGH (ref 7.25–7.43)
pO2, Ven: 51 mmHg — ABNORMAL HIGH (ref 32–45)

## 2022-03-12 LAB — CBC WITH DIFFERENTIAL/PLATELET
Abs Immature Granulocytes: 0 10*3/uL (ref 0.00–0.07)
Basophils Absolute: 0 10*3/uL (ref 0.0–0.1)
Basophils Relative: 0 %
Eosinophils Absolute: 0 10*3/uL (ref 0.0–0.5)
Eosinophils Relative: 0 %
HCT: 35.9 % — ABNORMAL LOW (ref 39.0–52.0)
Hemoglobin: 10.7 g/dL — ABNORMAL LOW (ref 13.0–17.0)
Lymphocytes Relative: 0 %
Lymphs Abs: 0 10*3/uL — ABNORMAL LOW (ref 0.7–4.0)
MCH: 26.4 pg (ref 26.0–34.0)
MCHC: 29.8 g/dL — ABNORMAL LOW (ref 30.0–36.0)
MCV: 88.6 fL (ref 80.0–100.0)
Monocytes Absolute: 0.7 10*3/uL (ref 0.1–1.0)
Monocytes Relative: 4 %
Neutro Abs: 16.9 10*3/uL — ABNORMAL HIGH (ref 1.7–7.7)
Neutrophils Relative %: 96 %
Platelets: 451 10*3/uL — ABNORMAL HIGH (ref 150–400)
RBC: 4.05 MIL/uL — ABNORMAL LOW (ref 4.22–5.81)
RDW: 21.4 % — ABNORMAL HIGH (ref 11.5–15.5)
WBC: 17.6 10*3/uL — ABNORMAL HIGH (ref 4.0–10.5)
nRBC: 0 % (ref 0.0–0.2)
nRBC: 0 /100 WBC

## 2022-03-12 LAB — COMPREHENSIVE METABOLIC PANEL
ALT: 61 U/L — ABNORMAL HIGH (ref 0–44)
AST: 32 U/L (ref 15–41)
Albumin: 2.3 g/dL — ABNORMAL LOW (ref 3.5–5.0)
Alkaline Phosphatase: 136 U/L — ABNORMAL HIGH (ref 38–126)
Anion gap: 8 (ref 5–15)
BUN: 27 mg/dL — ABNORMAL HIGH (ref 8–23)
CO2: 35 mmol/L — ABNORMAL HIGH (ref 22–32)
Calcium: 9 mg/dL (ref 8.9–10.3)
Chloride: 92 mmol/L — ABNORMAL LOW (ref 98–111)
Creatinine, Ser: 0.37 mg/dL — ABNORMAL LOW (ref 0.61–1.24)
GFR, Estimated: >60 mL/min (ref >60)
Glucose, Bld: 152 mg/dL — ABNORMAL HIGH (ref 70–99)
Potassium: 4.9 mmol/L (ref 3.5–5.1)
Sodium: 135 mmol/L (ref 135–145)
Total Bilirubin: 0.5 mg/dL (ref 0.3–1.2)
Total Protein: 8.1 g/dL (ref 6.5–8.1)

## 2022-03-12 LAB — RESP PANEL BY RT-PCR (RSV, FLU A&B, COVID)  RVPGX2
Influenza A by PCR: NEGATIVE
Influenza B by PCR: NEGATIVE
Resp Syncytial Virus by PCR: NEGATIVE
SARS Coronavirus 2 by RT PCR: NEGATIVE

## 2022-03-12 LAB — APTT: aPTT: 32 seconds (ref 24–36)

## 2022-03-12 LAB — PROTIME-INR
INR: 1.1 (ref 0.8–1.2)
Prothrombin Time: 14.3 seconds (ref 11.4–15.2)

## 2022-03-12 LAB — LACTIC ACID, PLASMA: Lactic Acid, Venous: 1.7 mmol/L (ref 0.5–1.9)

## 2022-03-12 LAB — AMMONIA: Ammonia: 107 umol/L — ABNORMAL HIGH (ref 9–35)

## 2022-03-12 MED ORDER — VANCOMYCIN HCL 1250 MG/250ML IV SOLN
1250.0000 mg | Freq: Once | INTRAVENOUS | Status: AC
  Administered 2022-03-12: 1250 mg via INTRAVENOUS
  Filled 2022-03-12: qty 250

## 2022-03-12 MED ORDER — SODIUM CHLORIDE 0.9 % IV SOLN: 2.0000 g | Freq: Once | INTRAVENOUS | Status: DC

## 2022-03-12 MED ORDER — LACTATED RINGERS IV BOLUS (SEPSIS)
250.0000 mL | Freq: Once | INTRAVENOUS | Status: AC
  Administered 2022-03-12: 250 mL via INTRAVENOUS

## 2022-03-12 MED ORDER — POLYETHYLENE GLYCOL 3350 17 G PO PACK: 17.0000 g | PACK | Freq: Every day | ORAL | Status: DC | PRN

## 2022-03-12 MED ORDER — DEXAMETHASONE SODIUM PHOSPHATE 10 MG/ML IJ SOLN
10.0000 mg | Freq: Once | INTRAMUSCULAR | Status: AC
  Administered 2022-03-12: 10 mg via INTRAVENOUS
  Filled 2022-03-12: qty 1

## 2022-03-12 MED ORDER — DEXTROSE 5 % IV SOLN
10.0000 mg/kg | Freq: Three times a day (TID) | INTRAVENOUS | Status: DC
  Filled 2022-03-12 (×2): qty 10.5

## 2022-03-12 MED ORDER — LACTATED RINGERS IV BOLUS (SEPSIS)
1000.0000 mL | Freq: Once | INTRAVENOUS | Status: AC
  Administered 2022-03-12: 1000 mL via INTRAVENOUS

## 2022-03-12 MED ORDER — VANCOMYCIN HCL 500 MG/100ML IV SOLN: 500.0000 mg | Freq: Two times a day (BID) | INTRAVENOUS | Status: DC

## 2022-03-12 MED ORDER — LEVETIRACETAM IN NACL 1000 MG/100ML IV SOLN
2000.0000 mg | Freq: Once | INTRAVENOUS | Status: AC
  Administered 2022-03-12: 2000 mg via INTRAVENOUS
  Filled 2022-03-12: qty 200

## 2022-03-12 MED ORDER — LINEZOLID 600 MG/300ML IV SOLN
600.0000 mg | Freq: Two times a day (BID) | INTRAVENOUS | Status: DC
  Administered 2022-03-13 – 2022-03-18 (×12): 600 mg via INTRAVENOUS
  Filled 2022-03-12 (×14): qty 300

## 2022-03-12 MED ORDER — HEPARIN SODIUM (PORCINE) 5000 UNIT/ML IJ SOLN
5000.0000 [IU] | Freq: Three times a day (TID) | INTRAMUSCULAR | Status: DC
  Administered 2022-03-13 – 2022-03-18 (×16): 5000 [IU] via SUBCUTANEOUS
  Filled 2022-03-12 (×16): qty 1

## 2022-03-12 MED ORDER — SODIUM CHLORIDE 0.9 % IV SOLN
2000.0000 mg | Freq: Once | INTRAVENOUS | Status: AC
  Administered 2022-03-12: 2000 mg via INTRAVENOUS
  Filled 2022-03-12: qty 40

## 2022-03-12 MED ORDER — DEXTROSE 5 % IV SOLN
10.0000 mg/kg | Freq: Once | INTRAVENOUS | Status: AC
  Administered 2022-03-12: 525 mg via INTRAVENOUS
  Filled 2022-03-12: qty 10.5

## 2022-03-12 MED ORDER — LORAZEPAM 2 MG/ML IJ SOLN
4.0000 mg | Freq: Once | INTRAMUSCULAR | Status: AC
  Administered 2022-03-12: 4 mg via INTRAVENOUS
  Filled 2022-03-12: qty 2

## 2022-03-12 MED ORDER — DOCUSATE SODIUM 50 MG/5ML PO LIQD: 100.0000 mg | Freq: Two times a day (BID) | ORAL | Status: DC | PRN

## 2022-03-12 MED ORDER — LACTATED RINGERS IV SOLN: INTRAVENOUS | Status: DC

## 2022-03-12 MED ORDER — SODIUM CHLORIDE 0.9 % IV SOLN
2.0000 g | Freq: Three times a day (TID) | INTRAVENOUS | Status: DC
  Administered 2022-03-13: 2 g via INTRAVENOUS
  Filled 2022-03-12 (×9): qty 40

## 2022-03-12 MED ORDER — LACTATED RINGERS IV BOLUS (SEPSIS)
500.0000 mL | Freq: Once | INTRAVENOUS | Status: AC
  Administered 2022-03-12: 500 mL via INTRAVENOUS

## 2022-03-12 NOTE — Progress Notes (Signed)
EEG complete - results pending 

## 2022-03-12 NOTE — ED Notes (Signed)
RT has been called for vent

## 2022-03-12 NOTE — Sepsis Progress Note (Signed)
Following for sepsis monitoring ?

## 2022-03-12 NOTE — ED Provider Notes (Signed)
[place] EMERGENCY DEPARTMENT AT [facility] Provider Note   CSN: [id number] Arrival date & time: 03/12/22  1936     History  Chief Complaint  Patient presents with   Seizures    Pt presents to ED for evaluation of witnessed seizure at [facility].     Austin Davis is a 82 y.o. male.  HPI   82 year old male, trach and vent dependent who resides at [employer], history of CHF, seizure disorders, frequent multidrug-resistant infections to include UTIs, Vapne, HCAP, recent admission for sepsis from UTI and seizure activity who presents to the emergency department from [facility] with concern for seizure.  Limited history was available given the patient's mental status on arrival.  Apparent seizure activity at [facility].  Patient arrived unresponsive, on the vent, eyes deviated upwards.  Home Medications Prior to Admission medications   Medication Sig Start Date End Date Taking? Authorizing Provider  acetaminophen (TYLENOL) 325 MG tablet Place 650 mg into feeding tube every 8 (eight) hours as needed for mild pain or fever.    [provider]  ALPRAZolam Prudy Feeler) 0.5 MG tablet Place 0.5 mg into feeding tube every 8 (eight) hours as needed for anxiety.    [provider]  esomeprazole (NEXIUM) 40 MG capsule 40 mg daily. Via feeding tube    [provider]  fluticasone (FLONASE) 50 MCG/ACT nasal spray Place 1 spray into both nostrils every 6 (six) hours as needed for allergies or rhinitis.    [provider]  folic acid (FOLVITE) 1 MG tablet Take 1 tablet (1 mg total) by mouth daily. Patient taking differently: Place 1 mg into feeding tube daily. 09/29/20   Azucena Fallen, MD  furosemide (LASIX) 20 MG tablet Place 1 tablet (20 mg total) into feeding tube daily as needed for fluid or edema. Patient taking differently: Place 20 mg into feeding tube daily. 07/03/21   Almon Hercules, MD  hydrOXYzine (ATARAX) 25 MG tablet Place 25 mg into feeding  tube every 6 (six) hours as needed for anxiety. 11/01/21   [provider]  hydrOXYzine (ATARAX) 50 MG tablet Place 50 mg into feeding tube every 6 (six) hours.    [provider]  Lactobacillus Rhamnosus, GG, (CULTURELLE PO) Place 10 Billion Cells into feeding tube daily.    [provider]  lactulose (CHRONULAC) 10 GM/15ML solution Place 20 g into feeding tube every 12 (twelve) hours as needed (constipation).    [provider]  levalbuterol Pauline Aus) 1.25 MG/3ML nebulizer solution Take 1.25 mg by nebulization every 6 (six) hours.    [provider]  levETIRAcetam (KEPPRA) 100 MG/ML solution Place 1,000 mg into feeding tube 2 (two) times daily.    [provider]  levothyroxine (SYNTHROID) 25 MCG tablet Place 25 mcg into feeding tube daily.    [provider]  Melatonin 3 MG TBDP Place 6 mg into feeding tube at bedtime.    [provider]  metoprolol tartrate (LOPRESSOR) 25 MG tablet Place 25 mg into feeding tube as needed (AFIB. Do not administer if low BP, systolic <60).    [provider]  midodrine (PROAMATINE) 10 MG tablet Place 1 tablet (10 mg total) into feeding tube 3 (three) times daily with meals. Patient taking differently: Place 10 mg into feeding tube 3 (three) times daily. 05/01/21   Leroy Sea, MD  Multiple Vitamins-Iron (MULTIVITAMINS WITH IRON) TABS tablet Take 1 tablet per tube daily    [provider]  Nutritional  Supplements (FEEDING SUPPLEMENT, OSMOLITE 1.5 CAL,) LIQD Place 55 mL/hr into feeding tube See admin instructions. 55 ml/hr for 22-24 hours = 1815-1980 kcal & 1520-1603 ml    [provider]  Omega-3 Fatty Acids (FISH OIL) 1000 MG CAPS Place 1,000 mg into feeding tube daily.    [provider]  polyethylene glycol (MIRALAX / GLYCOLAX) 17 g packet Place 17 g into feeding tube every other day.    [provider]  propranolol (INDERAL) 10 MG tablet Place  10 mg into feeding tube daily as needed (Tremor. Hold if HR is less than 60 or SBP less than 90).    [provider]  sertraline (ZOLOFT) 50 MG tablet Place 50 mg into feeding tube daily.    [provider]  tamsulosin (FLOMAX) 0.4 MG CAPS capsule 0.4 mg daily. Via feeding tube    [provider]  valproic acid (DEPAKENE) 250 MG/5ML solution Place 250 mg into feeding tube 2 (two) times daily.    [provider]  Water For Irrigation, Sterile (STERILE WATER FOR IRRIGATION) Irrigate with 100 mLs as directed See admin instructions. 100 ml H2O flush every 4 hours    [provider]      Allergies    Prednisone and Cortisone    Review of Systems   Review of Systems  Unable to perform ROS: Mental status change    Physical Exam Updated Vital Signs BP 136/78 (BP Location: Left Arm)   Pulse 82   Temp (!) 97 F (36.1 C) (Axillary)   Resp 19   Ht 5\' 10"  (1.778 m)   Wt 52.4 kg   SpO2 99%   BMI 16.58 kg/m  Physical Exam Vitals and nursing note reviewed.  Constitutional:      Comments: Unresponsive, GCS 6  HENT:     Head: Normocephalic and atraumatic.  Eyes:     Conjunctiva/sclera: Conjunctivae normal.     Pupils: Pupils are equal, round, and reactive to light.  Cardiovascular:     Rate and Rhythm: Normal rate and regular rhythm.     Pulses: Normal pulses.  Pulmonary:     Effort: Pulmonary effort is normal. No respiratory distress.     Comments: Tracheostomy in place patient connected to ventilator on baseline settings Abdominal:     General: There is no distension.     Tenderness: There is no guarding.  Musculoskeletal:        General: No deformity or signs of injury.     Cervical back: Neck supple.  Skin:    Findings: No lesion or rash.  Neurological:     Comments: Bilateral eyes deviated upwards, pupils bilaterally reactive to light, unresponsive to painful stimuli, bilateral tremor noted on painful stimuli     ED Results /  Procedures / Treatments   Labs (all labs ordered are listed, but only abnormal results are displayed) Labs Reviewed  COMPREHENSIVE METABOLIC PANEL - Abnormal; Notable for the following components:      Result Value   Chloride 92 (*)    CO2 35 (*)    Glucose, Bld 152 (*)    BUN 27 (*)    Creatinine, Ser 0.37 (*)    Albumin 2.3 (*)    ALT 61 (*)    Alkaline Phosphatase 136 (*)    All other components within normal limits  CBC WITH DIFFERENTIAL/PLATELET - Abnormal; Notable for the following components:   WBC 17.6 (*)    RBC 4.05 (*)    Hemoglobin 10.7 (*)  HCT 35.9 (*)    MCHC 29.8 (*)    RDW 21.4 (*)    Platelets 451 (*)    Neutro Abs 16.9 (*)    Lymphs Abs 0.0 (*)    All other components within normal limits  AMMONIA - Abnormal; Notable for the following components:   Ammonia 107 (*)    All other components within normal limits  I-STAT VENOUS BLOOD GAS, ED - Abnormal; Notable for the following components:   pH, Ven 7.446 (*)    pO2, Ven 51 (*)    Bicarbonate 37.1 (*)    TCO2 39 (*)    Acid-Base Excess 11.0 (*)    Sodium 133 (*)    Calcium, Ion 1.06 (*)    HCT 35.0 (*)    Hemoglobin 11.9 (*)    All other components within normal limits  RESP PANEL BY RT-PCR (RSV, FLU A&B, COVID)  RVPGX2  CULTURE, BLOOD (ROUTINE X 2)  CULTURE, BLOOD (ROUTINE X 2)  URINE CULTURE  LACTIC ACID, PLASMA  PROTIME-INR  APTT  URINALYSIS, ROUTINE W REFLEX MICROSCOPIC  CBG MONITORING, ED    EKG EKG Interpretation  Date/Time:  Wednesday March 12 2022 19:49:46 EST Ventricular Rate:  112 PR Interval:  157 QRS Duration: 99 QT Interval:  352 QTC Calculation: 481 R Axis:   81 Text Interpretation: Sinus tachycardia Borderline right axis deviation Borderline prolonged QT interval Confirmed by Ernie Avena ([id number]) on 03/12/2022 8:04:12 PM  Radiology CT HEAD WO CONTRAST ( )  Result Date: 03/12/2022 CLINICAL DATA:  Altered mental status. EXAM: CT HEAD WITHOUT CONTRAST TECHNIQUE:  Contiguous axial images were obtained from the base of the skull through the vertex without intravenous contrast. RADIATION DOSE REDUCTION: This exam was performed according to the departmental dose-optimization program which includes automated exposure control, adjustment of the mA and/or kV according to patient size and/or use of iterative reconstruction technique. COMPARISON:  February 26, 2022 FINDINGS: Brain: There is mild cerebral atrophy with widening of the extra-axial spaces and ventricular dilatation. There are areas of decreased attenuation within the white matter tracts of the supratentorial brain, consistent with microvascular disease changes. Vascular: There is mild to moderate severity bilateral cavernous carotid artery calcification. Skull: Normal. Negative for fracture or focal lesion. Sinuses/Orbits: There is mild sphenoid sinus mucosal thickening. Other: None. IMPRESSION: 1. No acute intracranial abnormality. 2. Generalized cerebral atrophy with chronic white matter small vessel C mic changes. 3. Mild sphenoid sinus disease. Electronically Signed   By: Aram Candela M.D.   On: 03/12/2022 22:45   DG Chest Portable 1 View  Result Date: 03/12/2022 CLINICAL DATA:  Altered mental status.  Patient is nonverbal. EXAM: PORTABLE CHEST 1 VIEW COMPARISON:  03/03/2022 FINDINGS: A tracheostomy tube is present with tip above the carina. Tracheostomy balloon again appears overinflated. Postoperative changes in the mediastinum with sternotomy wires present. Slightly shallow inspiration with linear infiltration or atelectasis in the lung bases. Mild interstitial pattern to the lung bases may represent early edema. This is progressing since the prior study. No pleural effusions or pneumothorax. IMPRESSION: 1. Tracheostomy tube remains in place with suggestion of over inflated balloon as before. 2. Shallow inspiration with linear atelectasis in the lung bases. 3. Developing interstitial pattern to the lung  bases may indicate developing pulmonary edema. Electronically Signed   By: Burman Nieves M.D.   On: 03/12/2022 20:22    Procedures .Critical Care  Performed by: Ernie Avena, MD Authorized by: Ernie Avena, MD   Critical care provider statement:  Critical care time (minutes):  30   Critical care was necessary to treat or prevent imminent or life-threatening deterioration of the following conditions:  Sepsis, respiratory failure and CNS failure or compromise   Critical care was time spent personally by me on the following activities:  Development of treatment plan with patient or surrogate, discussions with consultants, evaluation of patient's response to treatment, examination of patient, ordering and review of laboratory studies, ordering and review of radiographic studies, ordering and performing treatments and interventions, pulse oximetry, re-evaluation of patient's condition and review of old charts   Care discussed with: admitting provider       Medications Ordered in ED Medications  lactated ringers infusion ( Intravenous New Bag/Given 03/12/22 2359)  meropenem (MERREM) 2 g in sodium chloride 0.9 % 100 mL IVPB (has no administration in time range)  linezolid (ZYVOX) IVPB 600 mg (600 mg Intravenous New Bag/Given 03/13/22 0215)  docusate (COLACE) 50 MG/5ML liquid 100 mg (has no administration in time range)  polyethylene glycol (MIRALAX / GLYCOLAX) packet 17 g (has no administration in time range)  heparin injection 5,000 Units (has no administration in time range)  acyclovir (ZOVIRAX) 500 mg in dextrose 5 % 100 mL IVPB (has no administration in time range)  0.9 %  sodium chloride infusion (250 mLs Intravenous Not Given 03/13/22 0219)  norepinephrine (LEVOPHED) 4mg  in (0.016 mg/mL) premix infusion ( Intravenous Not Given 03/13/22 0219)  lactulose (CHRONULAC) 10 GM/15ML solution 30 g (30 g Per Tube Given 03/13/22 0217)  Chlorhexidine Gluconate Cloth 2 % PADS 6 each (6 each  Topical Given 03/13/22 0135)  Oral care mouth rinse (has no administration in time range)  LORazepam (ATIVAN) injection 4 mg (4 mg Intravenous Given 03/12/22 2020)  lactated ringers bolus 1,000 mL (0 mLs Intravenous Stopped 03/12/22 2144)    And  lactated ringers bolus 500 mL (0 mLs Intravenous Stopped 03/12/22 2129)    And  lactated ringers bolus 250 mL (0 mLs Intravenous Stopped 03/12/22 2121)  dexamethasone (DECADRON) injection 10 mg (10 mg Intravenous Given 03/12/22 2114)  acyclovir (ZOVIRAX) 525 mg in dextrose 5 % 100 mL IVPB (0 mg Intravenous Stopped 03/12/22 2316)  levETIRAcetam (KEPPRA) IVPB 1000 mg/100 mL premix (0 mg Intravenous Stopped 03/12/22 2132)  meropenem (MERREM) 2,000 mg in sodium chloride 0.9 % 100 mL IVPB (0 mg Intravenous Stopped 03/12/22 2229)  vancomycin (VANCOREADY) IVPB 1250 mg/250 mL (0 mg Intravenous Stopped 03/12/22 2347)    ED Course/ Medical Decision Making/ A&P                             Medical Decision Making Amount and/or Complexity of Data Reviewed Labs: ordered. Radiology: ordered.  Risk Prescription drug management. Decision regarding hospitalization.    82 year old male, trach and vent dependent who resides at [employer], history of CHF, seizure disorders, frequent multidrug-resistant infections to include UTIs, Vapne, HCAP, recent admission for sepsis from UTI and seizure activity who presents to the emergency department from [facility] with concern for seizure.  Limited history was available given the patient's mental status on arrival.  Apparent seizure activity at [facility].  Patient arrived unresponsive, on the vent, eyes deviated upwards.  On arrival, the patient was unresponsive, ventilated adequately, eyes deviated upwards, hypothermic temperature 97.3, temp 97 by rectal temp, saturating 100% on the vent, normal sinus rhythm noted on cardiac telemetry, normotensive.  During his previous hospital discharge she had been awake and alert following  commands.  He arrives acutely altered today from his baseline.  Concern for ongoing seizure activity versus status epilepticus, patient administered 4 mg of Ativan without response and loaded with Keppra 2 g.  Considered intracranial hemorrhage.  Additional differential diagnosis considerations include toxic metabolic derangement, electrolyte abnormality, hyperammonemia, meningitis/encephalitis, sepsis and subsequent encephalopathy.  Patient has a history of multidrug-resistant organisms causing infection from both genitourinary and respiratory source.  He was covered empirically with broad-spectrum antibiotics and antivirals with IV vancomycin, meropenem, acyclovir.  CT head was performed revealed no evidence of acute intracranial abnormality.  Chest x-ray showed clear lungs.  Laboratory evaluation significant for COVID-19, influenza, RSV PCR testing negative, CBC with leukocytosis to 17.6, anemia to 10.7, lactic acid normal at 1.7, VBG revealed an alkalosis with a pH of 7.45, pCO2 of 54, ammonia elevated to 107.  CMP without significant electrolyte abnormality, elevated blood glucose to 152, BUN elevated 27, creatinine stable, mildly elevated ALT to 61, alkaline phosphatase elevated to 136.  T. bili normal.  Urine studies ordered and blood cultures ordered and pending.  Given the patient's change in mental status, vent dependence, pulmonary critical care was consulted for admission for further management.  Patient symptoms could be due to hyperammonemia versus infectious etiology versus subclinical seizures.  The patient was fluid resuscitated with 30 cc/kg.  His blood pressure trended down and he was started on Levophed on repeat assessment.  He was subsequently admitted to critical care and critical condition.  Plan for neuro consult inpatient.      Final Clinical Impression(s) / ED Diagnoses Final diagnoses:  Seizure (HCC)  Sepsis, due to unspecified organism, unspecified whether acute organ  dysfunction present (HCC)  Hyperammonemia (HCC)    Rx / DC Orders ED Discharge Orders     None         Ernie Avena, MD 03/13/22 [phone]

## 2022-03-12 NOTE — Progress Notes (Addendum)
Pharmacy Antibiotic Note  Austin Davis is a 82 y.o. male for which pharmacy has been consulted for acyclovir, meropenem, and vancomycin dosing for meningitis.  Patient with a history of recurrent sepsis, infection with multidrug-resistant organisms, ESBL, Pseudomonas, Klebsiella, Enterococcus, Parkinson's, seizures, HF, CAD, GIB, AF, Tremor. Patient presenting from [facility] with seizures.  SCr 0.37 WBC 17.6; LA 1.7; T 97.3; HR 113; RR 23 Flu/COVID neg  Plan: Acyclovir 525mg  (10mg /kg) q8hr --LR running at 150 ml/hr Meropenem 2g q8h Vancomycin 1250 mg once then 500 mg q12hr per nomogram dosing based on target trough of 15-20 per protocol Trend WBC, Fever, Renal function F/u cultures, clinical course, WBC De-escalate when able Levels at steady state F/u ID recommendations  Height: 5\' 10"  (177.8 cm) Weight: 52.4 kg (115 lb 8.3 oz) IBW/kg (Calculated) : 73  Temp (24hrs), Avg:97.3 F (36.3 C), Min:97.3 F (36.3 C), Max:97.3 F (36.3 C)  No results for input(s): "WBC", "CREATININE", "LATICACIDVEN", "VANCOTROUGH", "VANCOPEAK", "VANCORANDOM", "GENTTROUGH", "GENTPEAK", "GENTRANDOM", "TOBRATROUGH", "TOBRAPEAK", "TOBRARND", "AMIKACINPEAK", "AMIKACINTROU", "AMIKACIN" in the last 168 hours.  CrCl cannot be calculated (This lab value cannot be used to calculate CrCl because it is not a number: <0.30).    Allergies  Allergen Reactions   Prednisone Other (See Comments)    Listed as allergy on MAR Unknown reaction   Cortisone Other (See Comments)    Listed as allergy per [facility] Unknown reaction   Microbiology results: Pending  Thank you for allowing pharmacy to be a part of this patient's care.  Lorelei Pont, PharmD, BCPS 03/12/2022 8:28 PM ED Clinical Pharmacist -  [phone]

## 2022-03-12 NOTE — H&P (Incomplete)
NAME:  AZIEL MORGAN, MRN:  [id number], DOB:  [birth year], LOS: 0 ADMISSION DATE:  03/12/2022, CONSULTATION DATE:  03/13/22 REFERRING MD:  EDP, CHIEF COMPLAINT:  seizure activity   History of Present Illness:  Mr. Pridgeon is a 82 year old male residing at [facility] trach/vent dependent, [facility],  with multiple admissions to [facility]  with recurrent sepsis, infection with multidrug-resistant organisms, ESBL, Pseudomonas, Klebsiella, Enterococcus.  He was recently admitted from 2/7 to 2/12 with encephalopathy, concern for seizures and  pseudomonas VAP with ESBL E. Coli UTI.  He was with Zyvox and meropenem and discharged back to [facility].  EEG was negative and he was at his mental status baseline at the time of discharge, awake and following commands.    On 2/21 Pt had apparent seizure activity at [facility], no clear history available, but sent to the ED with AMS where [facility] was negative, he was hemodynamically stable, labs significant for lactic acid of 1.7, ammonia 107, WBC 17.6, glu 152, EEG completed but not read yet.  He was loaded with Keppra and treated for presumed meningitis with Acyclovir, Meropenem, and Vancomycin and PCCM consulted for admission  Pertinent  Medical History   has a past medical history of CHF (congestive heart failure) ([place]), Coronary artery disease, Essential tremor, GI bleed (2019), Headache, Low blood sugar, Mitral regurgitation, Mitral valve prolapse, Osteopenia (2021), Paroxysmal atrial fibrillation ([place]), and Tremor.   Significant Hospital Events: Including procedures, antibiotic start and stop dates in addition to other pertinent events   2/22 admit to PCCM  Interim History / Subjective:  Pt unresponsive on vent, initially had some upward gaze deviation per EDP   Objective   Blood pressure (!) 82/56, pulse 84, temperature (!) 97 F (36.1 C), temperature source Rectal, resp. rate 20, height 5\' 10"  (1.778 m), weight 52.4 kg, SpO2 100 %.    Vent Mode: PCV FiO2  (%):  [50 %-100 %] 50 % Set Rate:  [20 bmp] 20 bmp PEEP:  [5 cmH20] 5 cmH20 Plateau Pressure:  [29 cmH20] 29 cmH20   Intake/Output Summary (Last 24 hours) at 03/12/2022 2359 Last data filed at 03/12/2022 2316 Gross per 24 hour  Intake 1183.83 ml  Output --  Net 1183.83 ml   Filed Weights   03/12/22 1940  Weight: 52.4 kg    General:  thin, elderly poorly nourished M unresponsive on vent HEENT: MM pink/moist, sclera anicteric, pupils equal Neuro: pt unresponsive to voice, minor hand tremor, does have tremor at baseline, triggering vent CV: s1s2 rrr, no m/r/g PULM:  good air entry bilaterally, few scattered rales, on PSV GI: soft, non-tender  Extremities: warm/dry, poor muscle tone and bulk, no edema  Skin: no rashes or lesions     Resolved Hospital Problem list     Assessment & Plan:    Acute Encephalopathy Seizure activity History of Parkinson disease Elevated Ammonia CTH neg -loaded with Keppra in the ED and EEG completed -Neuro consult, may need LTM -continue home valproic acid and Keppra -ammonia 106, LFT's not significantly elevated, start Lactulose     History of multiple admissions for recurrent ESBL infections Recent pseudomonal HCAP and ESBL E. Coli UTI -received Acyclovir, Meropenem and Vanc -follow blood cultures -given lactic normal, WBC elevated but improved from recent admission and no clear PNA will hold further abx for now -UA/UC pending    Chronic hypoxic respiratory failure Tracheostomy dependent  -on home vent settings -routine trach care   Hx atrial fibrillation -sinus on admission, not anti-coagulated secondary to  hx of GIB  Hypothyroidism -continue synthroid      Best Practice (right click and "Reselect all SmartList Selections" daily)   Diet/type: NPO DVT prophylaxis: prophylactic heparin  GI prophylaxis: PPI Lines: N/A Foley:  N/A Code Status:  full code Last date of multidisciplinary goals of care discussion  [pending]  Labs   CBC: Recent Labs  Lab 03/12/22 2053 03/12/22 2100  WBC 17.6*  --   NEUTROABS 16.9*  --   HGB 10.7* 11.9*  HCT 35.9* 35.0*  MCV 88.6  --   PLT 451*  --     Basic Metabolic Panel: Recent Labs  Lab 03/12/22 2053 03/12/22 2100  NA 135 133*  K 4.9 5.1  CL 92*  --   CO2 35*  --   GLUCOSE 152*  --   BUN 27*  --   CREATININE 0.37*  --   CALCIUM 9.0  --    GFR: Estimated Creatinine Clearance: 53.7 mL/min (A) (by C-G formula based on SCr of 0.37 mg/dL (L)). Recent Labs  Lab 03/12/22 2053  WBC 17.6*  LATICACIDVEN 1.7    Liver Function Tests: Recent Labs  Lab 03/12/22 2053  AST 32  ALT 61*  ALKPHOS 136*  BILITOT 0.5  PROT 8.1  ALBUMIN 2.3*   No results for input(s): "LIPASE", "AMYLASE" in the last 168 hours. Recent Labs  Lab 03/12/22 2053  AMMONIA 107*    ABG    Component Value Date/Time   PHART 7.456 (H) 02/27/2022 0325   PCO2ART 61.9 (H) 02/27/2022 0325   PO2ART 61 (L) 02/27/2022 0325   HCO3 37.1 (H) 03/12/2022 2100   TCO2 39 (H) 03/12/2022 2100   ACIDBASEDEF 2.0 05/30/2021 0052   O2SAT 86 03/12/2022 2100     Coagulation Profile: Recent Labs  Lab 03/12/22 2053  INR 1.1    Cardiac Enzymes: No results for input(s): "CKTOTAL", "CKMB", "CKMBINDEX", "TROPONINI" in the last 168 hours.  HbA1C: Hgb A1c MFr Bld  Date/Time Value Ref Range Status  01/04/2022 11:22 PM 5.3 4.8 - 5.6 % Final    Comment:    (NOTE)         Prediabetes: 5.7 - 6.4         Diabetes: >6.4         Glycemic control for adults with diabetes: <7.0   04/11/2021 09:12 PM 5.5 4.8 - 5.6 % Final    Comment:    (NOTE) Pre diabetes:          5.7%-6.4%  Diabetes:              >6.4%  Glycemic control for   <7.0% adults with diabetes     CBG: No results for input(s): "GLUCAP" in the last 168 hours.  Review of Systems:   Unable to obtain  Past Medical History:  He,  has a past medical history of CHF (congestive heart failure) ([place]), Coronary artery  disease, Essential tremor, GI bleed (2019), Headache, Low blood sugar, Mitral regurgitation, Mitral valve prolapse, Osteopenia (2021), Paroxysmal atrial fibrillation ([place]), and Tremor.   Surgical History:   Past Surgical History:  Procedure Laterality Date   COLONOSCOPY WITH PROPOFOL N/A 02/19/2017   Procedure: COLONOSCOPY WITH PROPOFOL;  Surgeon: Wilford Corner, MD;  Location: [facility] ENDOSCOPY;  Service: Endoscopy;  Laterality: N/A;   IR GASTROSTOMY TUBE MOD SED  10/18/2020   IR IVC FILTER PLMT / S&I /IMG GUID/MOD SED  11/01/2020   laser eye surgery for retina detachment     MITRAL VALVE REPAIR  01/2003   monitor  02/05/2006   polyp removal     TONSILLECTOMY     tooth removal     as a teenager   TRACHEOSTOMY TUBE PLACEMENT N/A 10/26/2020   Procedure: TRACHEOSTOMY;  Surgeon: Rozetta Nunnery, MD;  Location: [facility];  Service: ENT;  Laterality: N/A;     Social History:   reports that he has never smoked. He has never used smokeless tobacco. He reports current alcohol use. He reports that he does not use drugs.   Family History:  His family history includes Asthma in his daughter; CAD in his mother; Epilepsy in his son; Heart disease in his mother; Heart failure in his father, maternal grandmother, and paternal grandmother; Migraines in his mother; Pneumonia in his maternal grandfather; Skin cancer in his father; Stroke in his paternal grandfather; Tremor in his father; Valvular heart disease in his father. There is no history of Parkinson's disease.   Allergies Allergies  Allergen Reactions   Prednisone Other (See Comments)    Listed as allergy on MAR Unknown reaction   Cortisone Other (See Comments)    Listed as allergy per MAR Unknown reaction     Home Medications  Prior to Admission medications   Medication Sig Start Date End Date Taking? Authorizing Provider  acetaminophen (TYLENOL) 325 MG tablet Place 650 mg into feeding tube every 8 (eight) hours as needed for mild pain or  fever.    [provider]  ALPRAZolam Duanne Moron) 0.5 MG tablet Place 0.5 mg into feeding tube every 8 (eight) hours as needed for anxiety.    [provider]  esomeprazole (NEXIUM) 40 MG capsule 40 mg daily. Via feeding tube    [provider]  fluticasone (FLONASE) 50 MCG/ACT nasal spray Place 1 spray into both nostrils every 6 (six) hours as needed for allergies or rhinitis.    [provider]  folic acid (FOLVITE) 1 MG tablet Take 1 tablet (1 mg total) by mouth daily. Patient taking differently: Place 1 mg into feeding tube daily. 09/29/20   [name], MD  furosemide (LASIX) 20 MG tablet Place 1 tablet (20 mg total) into feeding tube daily as needed for fluid or edema. Patient taking differently: Place 20 mg into feeding tube daily. 07/03/21   Mercy Riding, MD  hydrOXYzine (ATARAX) 25 MG tablet Place 25 mg into feeding tube every 6 (six) hours as needed for anxiety. 11/01/21   [provider]  hydrOXYzine (ATARAX) 50 MG tablet Place 50 mg into feeding tube every 6 (six) hours.    [provider]  Lactobacillus Rhamnosus, GG, (CULTURELLE PO) Place 10 Billion Cells into feeding tube daily.    [provider]  lactulose (CHRONULAC) 10 GM/15ML solution Place 20 g into feeding tube every 12 (twelve) hours as needed (constipation).    [provider]  levalbuterol Penne Lash) 1.25 MG/3ML nebulizer solution Take 1.25 mg by nebulization every 6 (six) hours.    [provider]  levETIRAcetam (KEPPRA) 100 MG/ML solution Place 1,000 mg into feeding tube 2 (two) times daily.    [provider]  levothyroxine (SYNTHROID) 25 MCG tablet Place 25 mcg into feeding tube daily.    [provider]  Melatonin 3 MG TBDP Place 6 mg into feeding tube at bedtime.    [provider]  metoprolol tartrate (LOPRESSOR) 25 MG tablet Place 25 mg into feeding tube as needed (AFIB. Do not administer if low BP, systolic  <11).    [provider]  midodrine (PROAMATINE) 10 MG tablet Place 1 tablet (10 mg total) into feeding tube 3 (three) times daily with meals. Patient taking differently: Place 10 mg into feeding tube 3 (three) times daily. 05/01/21   Thurnell Lose, MD  Multiple Vitamins-Iron (MULTIVITAMINS WITH IRON) TABS tablet Take 1 tablet per tube daily    [provider]  Nutritional Supplements (FEEDING SUPPLEMENT, OSMOLITE 1.5 CAL,) LIQD Place 55 mL/hr into feeding tube See admin instructions. 55 ml/hr for 22-24 hours = 1815-1980 kcal & 1520-1603 ml    [provider]  Omega-3 Fatty Acids (FISH OIL) 1000 MG CAPS Place 1,000 mg into feeding tube daily.    [provider]  polyethylene glycol (MIRALAX / GLYCOLAX) 17 g packet Place 17 g into feeding tube every other day.    [provider]  propranolol (INDERAL) 10 MG tablet Place 10 mg into feeding tube daily as needed (Tremor. Hold if HR is less than 60 or SBP less than 90).    [provider]  sertraline (ZOLOFT) 50 MG tablet Place 50 mg into feeding tube daily.    [provider]  tamsulosin (FLOMAX) 0.4 MG CAPS capsule 0.4 mg daily. Via feeding tube    [provider]  valproic acid (DEPAKENE) 250 MG/5ML solution Place 250 mg into feeding tube 2 (two) times daily.    [provider]  Water For Irrigation, Sterile (STERILE WATER FOR IRRIGATION) Irrigate with 100 mLs as directed See admin instructions. 100 ml H2O flush every 4 hours    [provider]     Critical care time: 38 minutes    CRITICAL CARE Performed by: Otilio Carpen Juliana Boling   Total critical care time: 38 minutes  Critical care time was exclusive of separately billable procedures and treating other patients.  Critical care was necessary to treat or prevent imminent or life-threatening deterioration.  Critical care was time spent personally by me on the following activities: development of treatment  plan with patient and/or surrogate as well as nursing, discussions with consultants, evaluation of patient's response to treatment, examination of patient, obtaining history from patient or surrogate, ordering and performing treatments and interventions, ordering and review of laboratory studies, ordering and review of radiographic studies, pulse oximetry and re-evaluation of patient's condition.  Otilio Carpen Jeryn Bertoni, PA-C [facility] See Amion for pager If no response to pager , please call [phone] until 7pm After 7:00 pm call Elink  [phone]

## 2022-03-13 ENCOUNTER — Inpatient Hospital Stay (HOSPITAL_COMMUNITY): Payer: Medicare Other

## 2022-03-13 DIAGNOSIS — G9341 Metabolic encephalopathy: Secondary | ICD-10-CM | POA: Diagnosis not present

## 2022-03-13 DIAGNOSIS — A419 Sepsis, unspecified organism: Secondary | ICD-10-CM | POA: Diagnosis not present

## 2022-03-13 DIAGNOSIS — G250 Essential tremor: Secondary | ICD-10-CM | POA: Diagnosis not present

## 2022-03-13 DIAGNOSIS — R569 Unspecified convulsions: Secondary | ICD-10-CM | POA: Diagnosis not present

## 2022-03-13 LAB — URINALYSIS, ROUTINE W REFLEX MICROSCOPIC
Bilirubin Urine: NEGATIVE
Glucose, UA: 50 mg/dL — AB
Hgb urine dipstick: NEGATIVE
Ketones, ur: NEGATIVE mg/dL
Nitrite: NEGATIVE
Protein, ur: 100 mg/dL — AB
Specific Gravity, Urine: 1.014 (ref 1.005–1.030)
WBC, UA: >50 WBC/hpf (ref 0–5)
pH: 6 (ref 5.0–8.0)

## 2022-03-13 MED ORDER — DEXTROSE 5 % IV SOLN
500.0000 mg | Freq: Three times a day (TID) | INTRAVENOUS | Status: DC
  Administered 2022-03-13: 500 mg via INTRAVENOUS
  Filled 2022-03-13 (×5): qty 10

## 2022-03-13 MED ORDER — SODIUM CHLORIDE 0.9 % IV SOLN
2.0000 g | Freq: Three times a day (TID) | INTRAVENOUS | Status: DC
  Filled 2022-03-13 (×2): qty 40

## 2022-03-13 MED ORDER — PIPERACILLIN-TAZOBACTAM 3.375 G IVPB
3.3750 g | Freq: Three times a day (TID) | INTRAVENOUS | Status: DC
  Administered 2022-03-13 – 2022-03-14 (×3): 3.375 g via INTRAVENOUS
  Filled 2022-03-13 (×3): qty 50

## 2022-03-13 MED ORDER — IOHEXOL 12 MG/ML PO SOLN: 500.0000 mL | ORAL | Status: DC

## 2022-03-13 MED ORDER — SODIUM CHLORIDE 0.9 % IV SOLN
250.0000 mL | INTRAVENOUS | Status: DC
  Administered 2022-03-16: 250 mL via INTRAVENOUS

## 2022-03-13 MED ORDER — CHLORHEXIDINE GLUCONATE CLOTH 2 % EX PADS
6.0000 | MEDICATED_PAD | Freq: Every day | CUTANEOUS | Status: DC
  Administered 2022-03-13 – 2022-03-17 (×7): 6 via TOPICAL

## 2022-03-13 MED ORDER — NOREPINEPHRINE 4 MG/250ML-% IV SOLN: 2.0000 ug/min | INTRAVENOUS | Status: DC

## 2022-03-13 MED ORDER — LACTULOSE 10 GM/15ML PO SOLN
30.0000 g | Freq: Two times a day (BID) | ORAL | Status: DC
  Administered 2022-03-13 (×3): 30 g
  Filled 2022-03-13 (×4): qty 45

## 2022-03-13 MED ORDER — ACETAMINOPHEN 160 MG/5ML PO SOLN
650.0000 mg | Freq: Four times a day (QID) | ORAL | Status: DC | PRN
  Administered 2022-03-13 – 2022-03-15 (×2): 650 mg
  Filled 2022-03-13: qty 20.3

## 2022-03-13 MED ORDER — IOHEXOL 9 MG/ML PO SOLN
500.0000 mL | ORAL | Status: AC
  Administered 2022-03-13 (×2): 500 mL

## 2022-03-13 MED ORDER — IOHEXOL 350 MG/ML SOLN
75.0000 mL | Freq: Once | INTRAVENOUS | Status: AC | PRN
  Administered 2022-03-13: 75 mL via INTRAVENOUS

## 2022-03-13 MED ORDER — ORAL CARE MOUTH RINSE
15.0000 mL | OROMUCOSAL | Status: DC
  Administered 2022-03-13 – 2022-03-18 (×63): 15 mL via OROMUCOSAL

## 2022-03-13 MED ORDER — LACTATED RINGERS IV SOLN: INTRAVENOUS | Status: DC

## 2022-03-13 NOTE — ED Notes (Signed)
ED TO INPATIENT HANDOFF REPORT  ED Nurse Name and Phone #: Lysle Rubens RN [phone]  S Name/Age/Gender Austin Davis 82 y.o. male Room/Bed: 024C/024C  Code Status   Code Status: Full Code  Home/SNF/Other Skilled nursing facility Pt non-verbal Is this baseline? Yes   Triage Complete: Triage complete  Chief Complaint Seizure [facility]) [R56.9]  Triage Note No notes on file   Allergies Allergies  Allergen Reactions   Prednisone Other (See Comments)    Listed as allergy on MAR Unknown reaction   Cortisone Other (See Comments)    Listed as allergy per MAR Unknown reaction    Level of Care/Admitting Diagnosis ED Disposition     ED Disposition  Admit   Condition  --   [place]: [place] [[id number]]  Level of Care: ICU [6]  May admit patient to [facility] or [facility] if equivalent level of care is available:: No  Covid Evaluation: Asymptomatic - no recent exposure (last 10 days) testing not required  Diagnosis: Seizure ([place]) [[id number]]  Admitting Physician: Collene Gobble [place]  Attending Physician: Collene Gobble [[id number]]  Certification:: I certify this patient will need inpatient services for at least 2 midnights  Estimated Length of Stay: 3          B Medical/Surgery History Past Medical History:  Diagnosis Date   CHF (congestive heart failure) ([place])    Coronary artery disease    Essential tremor    GI bleed 2019   hospitalized at [facility] for one week   Headache    since childhood   Low blood sugar    since childhood, controlled by diet   Mitral regurgitation    Mitral valve prolapse    Osteopenia 2021   Paroxysmal atrial fibrillation (HCC)    Tremor    Past Surgical History:  Procedure Laterality Date   COLONOSCOPY WITH PROPOFOL N/A 02/19/2017   Procedure: COLONOSCOPY WITH PROPOFOL;  Surgeon: Wilford Corner, MD;  Location: [facility];  Service: Endoscopy;  Laterality: N/A;   IR GASTROSTOMY TUBE MOD SED   10/18/2020   IR IVC FILTER PLMT / S&I /IMG GUID/MOD SED  11/01/2020   laser eye surgery for retina detachment     MITRAL VALVE REPAIR  01/2003   monitor  02/05/2006   polyp removal     TONSILLECTOMY     tooth removal     as a teenager   TRACHEOSTOMY TUBE PLACEMENT N/A 10/26/2020   Procedure: TRACHEOSTOMY;  Surgeon: Rozetta Nunnery, MD;  Location: [place];  Service: ENT;  Laterality: N/A;     A IV Location/Drains/Wounds Patient Lines/Drains/Airways Status     Active Line/Drains/Airways     Name Placement date Placement time Site Days   Peripheral IV 03/12/22 20 G Anterior;Left Hand 03/12/22  1942  Hand  1   Peripheral IV 03/12/22 20 G 1" Right;Lateral Antecubital 03/12/22  2151  Antecubital  1   Gastrostomy/Enterostomy Gastrostomy 18 Fr. LUQ 10/18/20  0932  LUQ  511   Tracheostomy Shiley XLT Distal 6 mm Cuffed;Distal 10/26/21  0750  6 mm  138   Pressure Injury 05/08/21 Sacrum Medial;Lower Stage 1 -  Intact skin with non-blanchable redness of a localized area usually over a bony prominence. 05/08/21  0041  -- 309   Pressure Injury 02/01/22 Buttocks Left Stage 2 -  Partial thickness loss of dermis presenting as a shallow open injury with a red, pink wound bed without slough. 02/01/22  0640  --  40   Pressure Injury 02/26/22 Coccyx Medial Stage 1 -  Intact skin with non-blanchable redness of a localized area usually over a bony prominence. large reddend area, some areas blanchable while others non blanceable 02/26/22  0934  -- 15            Intake/Output Last 24 hours  Intake/Output Summary (Last 24 hours) at 03/13/2022 0032 Last data filed at 03/12/2022 2347 Gross per 24 hour  Intake 2432.2 ml  Output --  Net 2432.2 ml    Labs/Imaging Results for orders placed or performed during the hospital encounter of 03/12/22 (from the past 48 hour(s))  Resp panel by RT-PCR (RSV, Flu A&B, Covid) Anterior Nasal Swab     Status: None   Collection Time: 03/12/22  8:10 PM   Specimen:  Anterior Nasal Swab  Result Value Ref Range   SARS Coronavirus 2 by RT PCR NEGATIVE NEGATIVE   Influenza A by PCR NEGATIVE NEGATIVE   Influenza B by PCR NEGATIVE NEGATIVE    Comment: (NOTE) The Xpert Xpress SARS-CoV-2/FLU/RSV plus assay is intended as an aid in the diagnosis of influenza from Nasopharyngeal swab specimens and should not be used as a sole basis for treatment. Nasal washings and aspirates are unacceptable for Xpert Xpress SARS-CoV-2/FLU/RSV testing.  Fact Sheet for Patients: EntrepreneurPulse.com.au  Fact Sheet for Healthcare Providers: IncredibleEmployment.be  This test is not yet approved or cleared by the [place] FDA and has been authorized for detection and/or diagnosis of SARS-CoV-2 by FDA under an Emergency Use Authorization (EUA). This EUA will remain in effect (meaning this test can be used) for the duration of the COVID-19 declaration under Section 564(b)(1) of the Act, 21 U.S.C. section 360bbb-3(b)(1), unless the authorization is terminated or revoked.     Resp Syncytial Virus by PCR NEGATIVE NEGATIVE    Comment: (NOTE) Fact Sheet for Patients: EntrepreneurPulse.com.au  Fact Sheet for Healthcare Providers: IncredibleEmployment.be  This test is not yet approved or cleared by the [place] FDA and has been authorized for detection and/or diagnosis of SARS-CoV-2 by FDA under an Emergency Use Authorization (EUA). This EUA will remain in effect (meaning this test can be used) for the duration of the COVID-19 declaration under Section 564(b)(1) of the Act, 21 U.S.C. section 360bbb-3(b)(1), unless the authorization is terminated or revoked.  Performed at [facility], [place] [address] [place], [place] [address]   Comprehensive metabolic panel     Status: Abnormal   Collection Time: 03/12/22  8:53 PM  Result Value Ref Range   Sodium 135 135 - 145 mmol/L   Potassium  4.9 3.5 - 5.1 mmol/L   Chloride 92 (L) 98 - 111 mmol/L   CO2 35 (H) 22 - 32 mmol/L   Glucose, Bld 152 (H) 70 - 99 mg/dL    Comment: Glucose reference range applies only to samples taken after fasting for at least 8 hours.   BUN 27 (H) 8 - 23 mg/dL   Creatinine, Ser 0.37 (L) 0.61 - 1.24 mg/dL   Calcium 9.0 8.9 - 10.3 mg/dL   Total Protein 8.1 6.5 - 8.1 g/dL   Albumin 2.3 (L) 3.5 - 5.0 g/dL   AST 32 15 - 41 U/L   ALT 61 (H) 0 - 44 U/L   Alkaline Phosphatase 136 (H) 38 - 126 U/L   Total Bilirubin 0.5 0.3 - 1.2 mg/dL   GFR, Estimated >60 >60 mL/min    Comment: (NOTE) Calculated using the CKD-EPI Creatinine Equation (2021)  Anion gap 8 5 - 15    Comment: Performed at [facility] [address] [place], [place] [address]  CBC with Differential/Platelet     Status: Abnormal   Collection Time: 03/12/22  8:53 PM  Result Value Ref Range   WBC 17.6 (H) 4.0 - 10.5 K/uL   RBC 4.05 (L) 4.22 - 5.81 MIL/uL   Hemoglobin 10.7 (L) 13.0 - 17.0 g/dL   HCT 35.9 (L) 39.0 - 52.0 %   MCV 88.6 80.0 - 100.0 fL   MCH 26.4 26.0 - 34.0 pg   MCHC 29.8 (L) 30.0 - 36.0 g/dL   RDW 21.4 (H) 11.5 - 15.5 %   Platelets 451 (H) 150 - 400 K/uL   nRBC 0.0 0.0 - 0.2 %   Neutrophils Relative % 96 %   Neutro Abs 16.9 (H) 1.7 - 7.7 K/uL   Lymphocytes Relative 0 %   Lymphs Abs 0.0 (L) 0.7 - 4.0 K/uL   Monocytes Relative 4 %   Monocytes Absolute 0.7 0.1 - 1.0 K/uL   Eosinophils Relative 0 %   Eosinophils Absolute 0.0 0.0 - 0.5 K/uL   Basophils Relative 0 %   Basophils Absolute 0.0 0.0 - 0.1 K/uL   nRBC 0 0 /100 WBC   Abs Immature Granulocytes 0.00 0.00 - 0.07 K/uL   Polychromasia PRESENT     Comment: Performed at [facility], [place] [address], [place], [place] [address]  Ammonia     Status: Abnormal   Collection Time: 03/12/22  8:53 PM  Result Value Ref Range   Ammonia 107 (H) 9 - 35 umol/L    Comment: HEMOLYSIS AT THIS LEVEL MAY AFFECT RESULT Performed at [facility], [place] [address] [place], [place] [address]   Lactic acid, plasma     Status: None   Collection Time: 03/12/22  8:53 PM  Result Value Ref Range   Lactic Acid, Venous 1.7 0.5 - 1.9 mmol/L    Comment: Performed at [facility] [address], [place], [place] [address]  Protime-INR     Status: None   Collection Time: 03/12/22  8:53 PM  Result Value Ref Range   Prothrombin Time 14.3 11.4 - 15.2 seconds   INR 1.1 0.8 - 1.2    Comment: (NOTE) INR goal varies based on device and disease states. Performed at [facility], [place] [address], [place], [place] [address]   APTT     Status: None   Collection Time: 03/12/22  8:53 PM  Result Value Ref Range   aPTT 32 24 - 36 seconds    Comment: Performed at [facility] [address], [place], [place] [address]  I-Stat venous blood gas, ED     Status: Abnormal   Collection Time: 03/12/22  9:00 PM  Result Value Ref Range   pH, Ven 7.446 (H) 7.25 - 7.43   pCO2, Ven 53.9 44 - 60 mmHg   pO2, Ven 51 (H) 32 - 45 mmHg   Bicarbonate 37.1 (H) 20.0 - 28.0 mmol/L   TCO2 39 (H) 22 - 32 mmol/L   O2 Saturation 86 %   Acid-Base Excess 11.0 (H) 0.0 - 2.0 mmol/L   Sodium 133 (L) 135 - 145 mmol/L   Potassium 5.1 3.5 - 5.1 mmol/L   Calcium, Ion 1.06 (L) 1.15 - 1.40 mmol/L   HCT 35.0 (L) 39.0 - 52.0 %   Hemoglobin 11.9 (L) 13.0 - 17.0 g/dL  Sample type VENOUS    CT HEAD WO CONTRAST (5MM)  Result Date: 03/12/2022 CLINICAL DATA:  Altered mental status. EXAM: CT HEAD WITHOUT CONTRAST TECHNIQUE: Contiguous axial images were obtained from the base of the skull through the vertex without intravenous contrast. RADIATION DOSE REDUCTION: This exam was performed according to the departmental dose-optimization program which includes automated exposure control, adjustment of the mA and/or kV according to patient size and/or use of iterative reconstruction technique. COMPARISON:  February 26, 2022 FINDINGS: Brain: There is mild cerebral atrophy with widening of the  extra-axial spaces and ventricular dilatation. There are areas of decreased attenuation within the white matter tracts of the supratentorial brain, consistent with microvascular disease changes. Vascular: There is mild to moderate severity bilateral cavernous carotid artery calcification. Skull: Normal. Negative for fracture or focal lesion. Sinuses/Orbits: There is mild sphenoid sinus mucosal thickening. Other: None. IMPRESSION: 1. No acute intracranial abnormality. 2. Generalized cerebral atrophy with chronic white matter small vessel C mic changes. 3. Mild sphenoid sinus disease. Electronically Signed   By: Virgina Norfolk M.D.   On: 03/12/2022 22:45   DG Chest Portable 1 View  Result Date: 03/12/2022 CLINICAL DATA:  Altered mental status.  Patient is nonverbal. EXAM: PORTABLE CHEST 1 VIEW COMPARISON:  03/03/2022 FINDINGS: A tracheostomy tube is present with tip above the carina. Tracheostomy balloon again appears overinflated. Postoperative changes in the mediastinum with sternotomy wires present. Slightly shallow inspiration with linear infiltration or atelectasis in the lung bases. Mild interstitial pattern to the lung bases may represent early edema. This is progressing since the prior study. No pleural effusions or pneumothorax. IMPRESSION: 1. Tracheostomy tube remains in place with suggestion of over inflated balloon as before. 2. Shallow inspiration with linear atelectasis in the lung bases. 3. Developing interstitial pattern to the lung bases may indicate developing pulmonary edema. Electronically Signed   By: Lucienne Capers M.D.   On: 03/12/2022 20:22    Pending Labs Unresulted Labs (From admission, onward)     Start     Ordered   03/12/22 2359  CBC  (heparin)  Once,   R       Comments: Baseline for heparin therapy IF NOT ALREADY DRAWN.  Notify MD if PLT < 100 K.    03/12/22 2359   03/12/22 2359  Creatinine, serum  (heparin)  Once,   R       Comments: Baseline for heparin therapy IF  NOT ALREADY DRAWN.    03/12/22 2359   03/12/22 2011  Remove and replace urinary cath (placed > 5 days) then obtain urine culture from new indwelling urinary catheter.  (Septic presentation on arrival (screening labs, nursing and treatment orders for obvious sepsis))  Once,   URGENT       Question:  Indication  Answer:  Altered mental status (if no other cause identified)   03/12/22 2011   03/12/22 2010  Urinalysis, Routine w reflex microscopic -Urine, Catheterized; Indwelling urinary catheter  (Septic presentation on arrival (screening labs, nursing and treatment orders for obvious sepsis))  ONCE - URGENT,   URGENT       Question Answer Comment  Specimen Source Urine, Catheterized   Specimen Source Indwelling urinary catheter      03/12/22 2011   03/12/22 2005  Blood culture (routine x 2)  BLOOD CULTURE X 2,   R      03/12/22 2004            Vitals/Pain Today's Vitals   03/12/22 2200 03/12/22 2230  03/12/22 2300 03/13/22 0012  BP: 118/88  (!) 82/56   Pulse: 82  84   Resp: 20  20   Temp:  (!) 97 F (36.1 C)    TempSrc:  Rectal    SpO2: 100%  100% 100%  Weight:      Height:      PainSc:        Isolation Precautions No active isolations  Medications Medications  lactated ringers infusion ( Intravenous New Bag/Given 03/12/22 2359)  meropenem (MERREM) 2 g in sodium chloride 0.9 % 100 mL IVPB (has no administration in time range)  linezolid (ZYVOX) IVPB 600 mg (has no administration in time range)  docusate (COLACE) 50 MG/5ML liquid 100 mg (has no administration in time range)  polyethylene glycol (MIRALAX / GLYCOLAX) packet 17 g (has no administration in time range)  heparin injection 5,000 Units (has no administration in time range)  acyclovir (ZOVIRAX) 500 mg in dextrose 5 % 100 mL IVPB (has no administration in time range)  LORazepam (ATIVAN) injection 4 mg (4 mg Intravenous Given 03/12/22 2020)  lactated ringers bolus 1,000 mL (0 mLs Intravenous Stopped 03/12/22 2144)     And  lactated ringers bolus 500 mL (0 mLs Intravenous Stopped 03/12/22 2129)    And  lactated ringers bolus 250 mL (0 mLs Intravenous Stopped 03/12/22 2121)  dexamethasone (DECADRON) injection 10 mg (10 mg Intravenous Given 03/12/22 2114)  acyclovir (ZOVIRAX) 525 mg in dextrose 5 % 100 mL IVPB (0 mg Intravenous Stopped 03/12/22 2316)  levETIRAcetam (KEPPRA) IVPB 1000 mg/100 mL premix (0 mg Intravenous Stopped 03/12/22 2132)  meropenem (MERREM) 2,000 mg in sodium chloride 0.9 % 100 mL IVPB (0 mg Intravenous Stopped 03/12/22 2229)  vancomycin (VANCOREADY) IVPB 1250 mg/250 mL (0 mg Intravenous Stopped 03/12/22 2347)    Mobility non-ambulatory     Focused Assessments Cardiac Assessment Handoff:  Cardiac Rhythm: Sinus tachycardia Lab Results  Component Value Date   CKTOTAL 26 (L) 09/22/2020   Lab Results  Component Value Date   DDIMER <0.27 09/16/2020   Does the Patient currently have chest pain? No   , Neuro Assessment Handoff:  Swallow screen pass?    Cardiac Rhythm: Sinus tachycardia       Neuro Assessment: Exceptions to [facility] Neuro Checks:      Has TPA been given?  If patient is a Neuro Trauma and patient is going to OR before floor call report to 4N Charge nurse: [phone] or [phone]  , Renal Assessment Handoff:  Hemodialysis Schedule:  Last Hemodialysis date and time:    Restricted appendage:    , Pulmonary Assessment Handoff:  Lung sounds: Bilateral Breath Sounds: Coarse crackles, Diminished O2 Device: Tracheostomy Collar, Ventilator      R Recommendations: See Admitting Provider Note  Report given to:   Additional Notes:

## 2022-03-13 NOTE — Procedures (Addendum)
Patient Name: Austin Davis  MRN: [id number]  Epilepsy Attending: Lora Havens  Referring Physician/Provider: Regan Lemming, MD  Date: 03/12/2022 Duration: 56 mins   Patient history: 82yo M with h/o history of seizure like episodes in the past. EEG to evaluate for seizure.    Level of alertness:  awake, asleep   AEDs during EEG study: LEV   Technical aspects: This EEG study was done with scalp electrodes positioned according to the 10-20 International system of electrode placement. Electrical activity was reviewed with band pass filter of 1-70Hz , sensitivity of 7 uV/mm, display speed of 15mm/sec with a 60Hz  notched filter applied as appropriate. EEG data were recorded continuously and digitally stored.  Video monitoring was available and reviewed as appropriate.   Description: The posterior dominant rhythm consists of 8-9 Hz activity of moderate voltage (25-35 uV) seen predominantly in posterior head regions, symmetric and reactive to eye opening and eye closing. Sleep was characterized by vertex waves, sleep spindles (12 to 14 Hz), maximal frontocentral region. EEG showed intermittent generalized 3 to 6 Hz theta-delta slowing  Hyperventilation and photic stimulation were not performed.      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 No seizures or epileptiform discharges were seen throughout the recording.   [name] Barbra Sarks

## 2022-03-13 NOTE — Progress Notes (Signed)
Pt transported to CT and back to 4N16 without any complications.

## 2022-03-13 NOTE — Consult Note (Signed)
NEURO HOSPITALIST CONSULT NOTE   Requesting physician: Dr. Lamonte Sakai  Reason for Consult: Decreased responsiveness and possible seizures  History obtained from:  Patient   Chart, limited patient participation  HPI:                                                                                                                                          Austin Davis is an 82 y.o. male residing at [facility] trach/vent dependent, HFrEF, atrial fibrillation not on anticoagulation, with multiple admissions to [facility] for management of recurrent sepsis, infection with multidrug-resistant organisms, ESBL, Pseudomonas, Klebsiella, Enterococcus.  He was recently admitted from 2/7 to 2/12 with encephalopathy, concern for seizures and pseudomonas VAP with Extended-spectrum beta-lactamase E. Coli UTI. EEG was negative at that time. He was treated with Zyvox/meropenem and discharged back to [facility]. His mental status was at baseline at the time of discharge, awake and following commands. He re-presents to the hospital after "apparent seizure activity" at [place]. BUN/Cr ratio is elevated, concerning for possible volume depletion, but eGFR is > 60. WBC elevated at 75.9 with neutrophilic predominance.   EEG was obtained while the patient was still in the ED, with official read pending.   CT head: No acute intracranial abnormality. Generalized cerebral atrophy with chronic white matter small vessel ischemic changes. Mild sphenoid sinus disease.  Most recent MRI brain obtained in April of last year revealed prominence of the ventricles and sulci reflecting parenchymal volume loss. Disproportionate bilateral hippocampal atrophy is appreciated on personal review of the images, but no other evidence for hippocampal sclerosis is seen. Minimal patchy foci of T2 hyperintensity in the supratentorial white matter were nonspecific.   Neurology has been consulted for unresponsiveness and possible  seizure activity.   Regarding his seizure history, in April 2023 he was brought to the ED for concern for possible seizure activity of behavioral arrest, empirically started on Keppra 500 twice daily.  Subsequently for further events of behavioral arrest Keppra has been increased to 1000 mg twice daily.  He did have some events of reported right gaze deviation at his facility concerning for focal seizure, with reported right arm tremor, which has never been captured on EEG.  He has had prolonged EEG monitoring while on Keppra that has been negative for ictal interictal activity.  No clear seizure risk factors other than history of epilepsy in son, and atrophy with repeated hypoxic events secondary to his tracheostomy, although full review is limited by his mental status at this time  Past Medical History:  Diagnosis Date   CHF (congestive heart failure) ([place])    Coronary artery disease    Essential tremor    GI bleed 2019   hospitalized at [facility] for one week   Headache  since childhood   Low blood sugar    since childhood, controlled by diet   Mitral regurgitation    Mitral valve prolapse    Osteopenia 2021   Paroxysmal atrial fibrillation (HCC)    Tremor     Past Surgical History:  Procedure Laterality Date   COLONOSCOPY WITH PROPOFOL N/A 02/19/2017   Procedure: COLONOSCOPY WITH PROPOFOL;  Surgeon: Wilford Corner, MD;  Location: [facility];  Service: Endoscopy;  Laterality: N/A;   IR GASTROSTOMY TUBE MOD SED  10/18/2020   IR IVC FILTER PLMT / S&I /IMG GUID/MOD SED  11/01/2020   laser eye surgery for retina detachment     MITRAL VALVE REPAIR  01/2003   monitor  02/05/2006   polyp removal     TONSILLECTOMY     tooth removal     as a teenager   TRACHEOSTOMY TUBE PLACEMENT N/A 10/26/2020   Procedure: TRACHEOSTOMY;  Surgeon: Rozetta Nunnery, MD;  Location: [place];  Service: ENT;  Laterality: N/A;    Family History  Problem Relation Age of Onset   Heart disease  Mother    CAD Mother    Migraines Mother    Heart failure Father    Skin cancer Father    Valvular heart disease Father        prolapsed valve   Tremor Father        "probably had a bit of tremor in his old age"   Heart failure Maternal Grandmother    Pneumonia Maternal Grandfather    Heart failure Paternal Grandmother    Stroke Paternal Grandfather    Asthma Daughter    Epilepsy Son    Parkinson's disease Neg Hx    Family History  Problem Relation Age of Onset   Heart disease Mother    CAD Mother    Migraines Mother    Heart failure Father    Skin cancer Father    Valvular heart disease Father        prolapsed valve   Tremor Father        "probably had a bit of tremor in his old age"   Heart failure Maternal Grandmother    Pneumonia Maternal Grandfather    Heart failure Paternal Grandmother    Stroke Paternal Grandfather    Asthma Daughter    Epilepsy Son    Parkinson's disease Neg Hx     Social History:  reports that he has never smoked. He has never used smokeless tobacco. He reports current alcohol use. He reports that he does not use drugs.  Allergies  Allergen Reactions   Prednisone Other (See Comments)    Listed as allergy on MAR Unknown reaction   Cortisone Other (See Comments)    Listed as allergy per MAR Unknown reaction    MEDICATIONS:                                                                                                                     Prior to Admission:  Medications Prior to Admission  Medication Sig Dispense Refill Last Dose   acetaminophen (TYLENOL) 325 MG tablet Place 650 mg into feeding tube every 8 (eight) hours as needed for mild pain or fever.      ALPRAZolam (XANAX) 0.5 MG tablet Place 0.5 mg into feeding tube every 8 (eight) hours as needed for anxiety.      esomeprazole (NEXIUM) 40 MG capsule 40 mg daily. Via feeding tube      fluticasone (FLONASE) 50 MCG/ACT nasal spray Place 1 spray into both nostrils every 6 (six) hours  as needed for allergies or rhinitis.      folic acid (FOLVITE) 1 MG tablet Take 1 tablet (1 mg total) by mouth daily. (Patient taking differently: Place 1 mg into feeding tube daily.) 30 tablet 0    furosemide (LASIX) 20 MG tablet Place 1 tablet (20 mg total) into feeding tube daily as needed for fluid or edema. (Patient taking differently: Place 20 mg into feeding tube daily.) 30 tablet     hydrOXYzine (ATARAX) 25 MG tablet Place 25 mg into feeding tube every 6 (six) hours as needed for anxiety.      hydrOXYzine (ATARAX) 50 MG tablet Place 50 mg into feeding tube every 6 (six) hours.      Lactobacillus Rhamnosus, GG, (CULTURELLE PO) Place 10 Billion Cells into feeding tube daily.      lactulose (CHRONULAC) 10 GM/15ML solution Place 20 g into feeding tube every 12 (twelve) hours as needed (constipation).      levalbuterol (XOPENEX) 1.25 MG/3ML nebulizer solution Take 1.25 mg by nebulization every 6 (six) hours.      levETIRAcetam (KEPPRA) 100 MG/ML solution Place 1,000 mg into feeding tube 2 (two) times daily.      levothyroxine (SYNTHROID) 25 MCG tablet Place 25 mcg into feeding tube daily.      Melatonin 3 MG TBDP Place 6 mg into feeding tube at bedtime.      metoprolol tartrate (LOPRESSOR) 25 MG tablet Place 25 mg into feeding tube as needed (AFIB. Do not administer if low BP, systolic <98).      midodrine (PROAMATINE) 10 MG tablet Place 1 tablet (10 mg total) into feeding tube 3 (three) times daily with meals. (Patient taking differently: Place 10 mg into feeding tube 3 (three) times daily.)      Multiple Vitamins-Iron (MULTIVITAMINS WITH IRON) TABS tablet Take 1 tablet per tube daily      Nutritional Supplements (FEEDING SUPPLEMENT, OSMOLITE 1.5 CAL,) LIQD Place 55 mL/hr into feeding tube See admin instructions. 55 ml/hr for 22-24 hours = 1815-1980 kcal & 1520-1603 ml      Omega-3 Fatty Acids (FISH OIL) 1000 MG CAPS Place 1,000 mg into feeding tube daily.      polyethylene glycol (MIRALAX /  GLYCOLAX) 17 g packet Place 17 g into feeding tube every other day.      propranolol (INDERAL) 10 MG tablet Place 10 mg into feeding tube daily as needed (Tremor. Hold if HR is less than 60 or SBP less than 90).      sertraline (ZOLOFT) 50 MG tablet Place 50 mg into feeding tube daily.      tamsulosin (FLOMAX) 0.4 MG CAPS capsule 0.4 mg daily. Via feeding tube      valproic acid (DEPAKENE) 250 MG/5ML solution Place 250 mg into feeding tube 2 (two) times daily.      Water For Irrigation, Sterile (STERILE WATER FOR IRRIGATION) Irrigate with 100 mLs as directed See admin instructions. 100  ml H2O flush every 4 hours       Current Facility-Administered Medications:    0.9 %  sodium chloride infusion, 250 mL, Intravenous, Continuous, Regan Lemming, MD   Chlorhexidine Gluconate Cloth 2 % PADS 6 each, 6 each, Topical, Daily, Ogan, Kerry Kass, MD, 6 each at 03/13/22 0135   docusate (COLACE) 50 MG/5ML liquid 100 mg, 100 mg, Per Tube, BID PRN, Gleason, Otilio Carpen, PA-C   heparin injection 5,000 Units, 5,000 Units, Subcutaneous, Q8H, Gleason, Otilio Carpen, PA-C   lactulose (CHRONULAC) 10 GM/15ML solution 30 g, 30 g, Per Tube, BID, Gleason, Otilio Carpen, PA-C, 30 g at 03/13/22 0217   linezolid (ZYVOX) IVPB 600 mg, 600 mg, Intravenous, Q12H, Regan Lemming, MD, Stopped at 03/13/22 0322   norepinephrine (LEVOPHED) 4mg  in 215mL (0.016 mg/mL) premix infusion, 2-10 mcg/min, Intravenous, Titrated, Regan Lemming, MD   Oral care mouth rinse, 15 mL, Mouth Rinse, Q2H, Ogan, Okoronkwo U, MD, 15 mL at 03/13/22 0752   piperacillin-tazobactam (ZOSYN) IVPB 3.375 g, 3.375 g, Intravenous, Q8H, Hunsucker, Bonna Gains, MD   polyethylene glycol (MIRALAX / GLYCOLAX) packet 17 g, 17 g, Per Tube, Daily PRN, Gleason, Otilio Carpen, PA-C     ROS:                                                                                                                                       History obtained from chart review and unobtainable from patient due  to mental status  General ROS: negative for - chills, fatigue, fever, night sweats, weight gain or weight loss Psychological ROS: negative for - behavioral disorder, hallucinations, memory difficulties, mood swings or suicidal ideation Ophthalmic ROS: negative for - blurry vision, double vision, eye pain or loss of vision ENT ROS: negative for - epistaxis, nasal discharge, oral lesions, sore throat, tinnitus or vertigo Allergy and Immunology ROS: negative for - hives or itchy/watery eyes Hematological and Lymphatic ROS: negative for - bleeding problems, bruising or swollen lymph nodes Endocrine ROS: negative for - galactorrhea, hair pattern changes, polydipsia/polyuria or temperature intolerance Respiratory ROS: negative for - cough, hemoptysis, shortness of breath or wheezing Cardiovascular ROS: negative for - chest pain, dyspnea on exertion, edema or irregular heartbeat Gastrointestinal ROS: negative for - abdominal pain, diarrhea, hematemesis, nausea/vomiting or stool incontinence Genito-Urinary ROS: negative for - dysuria, hematuria, incontinence or urinary frequency/urgency Musculoskeletal ROS: negative for - joint swelling or muscular weakness Neurological ROS: as noted in HPI Dermatological ROS: negative for rash and skin lesion changes  Current vital signs: BP 108/62   Pulse 79   Temp 99.1 F (37.3 C) (Axillary)   Resp 20   Ht 5\' 10"  (1.778 m)   Wt 52.4 kg   SpO2 100%   BMI 16.58 kg/m  Vital signs in last 24 hours: Temp:  [97 F (36.1 C)-99.1 F (37.3 C)] 99.1 F (37.3 C) (02/22 0800) Pulse Rate:  [68-113] 79 (02/22 0744)  Resp:  [19-23] 20 (02/22 0700) BP: (82-136)/(56-88) 108/62 (02/22 0744) SpO2:  [95 %-100 %] 100 % (02/22 0700) FiO2 (%):  [40 %-100 %] 40 % (02/22 0744) Weight:  [52.4 kg] 52.4 kg (02/21 1940)    General Examination:                                                                                                       Physical Exam  General:  Chronically ill-appearing,  HEENT-temporal muscle wasting Cardiovascular- S1-S2 audible, pulses palpable throughout   Lungs-tracheostomy in place Abdomen-winces to palpation of the abdomen, which is soft but noted purulent discharge at PEG site that is foul-smelling Extremities- Warm, dry and intact Musculoskeletal-diffuse muscle atrophy Skin-PEG site lesion as described above  Neurological Examination Mental Status: Alert, mouths words to examiner but difficult to comprehend especially in the setting of his mouth tremor. Cranial Nerves: II: Blink to threat intact.   III,IV, VI: Pupils equal round reactive 4 mm to 2 mm, orients to examiner bilaterally, perhaps slightly more readily to the right than to the left, saccadic pursuits V,VII: Symmetric grimace, sensation intact to light eyelash brush VIII: hearing intact to voice Remainder limited by mental status Motor: Diffuse atrophy.  Antigravity with the bilateral upper extremities and gestures with the thumb spontaneously.  Squeezes and lets go with both hands a little faster on the right than the left but unclear if this is secondary to attention.  1/5 bilateral legs Sensory: Grossly equally reactive to light touch throughout    Lab Results: Basic Metabolic Panel: Recent Labs  Lab 03/12/22 2053 03/12/22 2100  NA 135 133*  K 4.9 5.1  CL 92*  --   CO2 35*  --   GLUCOSE 152*  --   BUN 27*  --   CREATININE 0.37*  --   CALCIUM 9.0  --      CBC: Recent Labs  Lab 03/12/22 2053 03/12/22 2100  WBC 17.6*  --   NEUTROABS 16.9*  --   HGB 10.7* 11.9*  HCT 35.9* 35.0*  MCV 88.6  --   PLT 451*  --      Cardiac Enzymes: No results for input(s): "CKTOTAL", "CKMB", "CKMBINDEX", "TROPONINI" in the last 168 hours.  Lipid Panel: No results for input(s): "CHOL", "TRIG", "HDL", "CHOLHDL", "VLDL", "LDLCALC" in the last 168 hours.  Imaging: CT HEAD WO CONTRAST (5MM)  Result Date: 03/12/2022 CLINICAL DATA:  Altered mental  status. EXAM: CT HEAD WITHOUT CONTRAST TECHNIQUE: Contiguous axial images were obtained from the base of the skull through the vertex without intravenous contrast. RADIATION DOSE REDUCTION: This exam was performed according to the departmental dose-optimization program which includes automated exposure control, adjustment of the mA and/or kV according to patient size and/or use of iterative reconstruction technique. COMPARISON:  February 26, 2022 FINDINGS: Brain: There is mild cerebral atrophy with widening of the extra-axial spaces and ventricular dilatation. There are areas of decreased attenuation within the white matter tracts of the supratentorial brain, consistent with microvascular disease changes. Vascular: There is mild to moderate severity bilateral cavernous carotid artery  calcification. Skull: Normal. Negative for fracture or focal lesion. Sinuses/Orbits: There is mild sphenoid sinus mucosal thickening. Other: None. IMPRESSION: 1. No acute intracranial abnormality. 2. Generalized cerebral atrophy with chronic white matter small vessel C mic changes. 3. Mild sphenoid sinus disease. Electronically Signed   By: Virgina Norfolk M.D.   On: 03/12/2022 22:45   DG Chest Portable 1 View  Result Date: 03/12/2022 CLINICAL DATA:  Altered mental status.  Patient is nonverbal. EXAM: PORTABLE CHEST 1 VIEW COMPARISON:  03/03/2022 FINDINGS: A tracheostomy tube is present with tip above the carina. Tracheostomy balloon again appears overinflated. Postoperative changes in the mediastinum with sternotomy wires present. Slightly shallow inspiration with linear infiltration or atelectasis in the lung bases. Mild interstitial pattern to the lung bases may represent early edema. This is progressing since the prior study. No pleural effusions or pneumothorax. IMPRESSION: 1. Tracheostomy tube remains in place with suggestion of over inflated balloon as before. 2. Shallow inspiration with linear atelectasis in the lung bases.  3. Developing interstitial pattern to the lung bases may indicate developing pulmonary edema. Electronically Signed   By: Lucienne Capers M.D.   On: 03/12/2022 20:22     Assessment: 82 year old male  - Exam reveals patient who seems nearly at his baseline mental status perhaps with some delirium (poor attention/concentration) in the setting of acute infection, baseline mouth and bilateral arm tremors consistent with his baseline diagnosis of essential tremor - Potential PEG site infection as well as potential UTI - EEG negative, personally reviewed - He has had events prior concerning for possible focal seizure but given his mental status at baseline as well as his baseline tremor, unclear if these are true seizure activities or cognitive fluctuation in the setting of some dementia and poor cognitive reserve as well as frequent encephalopathy secondary to frequent infections / hypoxic events - Addition and up titration of Keppra does not seem to have made a difference in these events.  In fact I query whether it may be contributing to these effects due to the sedating effects of the medication without a clear diagnosis of underlying epilepsy.  Therefore I do think he would benefit from discontinuation of Keppra and monitoring on long-term EEG to try to clarify whether there is any underlying epilepsy   Recommendations: -Agree with CCM, low concern for meningitis, narrow antibiotics as appropriate for current suspected sources of infection -Discontinue Keppra -Long-term EEG monitoring -Neurology will follow along, with CCM at bedside  Lesleigh Noe MD-PhD [facility] [phone]  Available 7 AM to 7 PM, outside these hours please contact Neurologist on call listed on [place] Performed by: Lorenza Chick   Total critical care time: 40 minutes  Critical care time was exclusive of separately billable procedures and treating other patients.  Critical care was  necessary to treat or prevent imminent or life-threatening deterioration.  Critical care was time spent personally by me on the following activities: development of treatment plan with patient and/or surrogate as well as nursing, discussions with consultants, evaluation of patient's response to treatment, examination of patient, obtaining history from patient or surrogate, ordering and performing treatments and interventions, ordering and review of laboratory studies, ordering and review of radiographic studies, pulse oximetry and re-evaluation of patient's condition.

## 2022-03-13 NOTE — Progress Notes (Signed)
NAME:  Austin Davis, MRN:  [id number], DOB:  1940/11/10, LOS: 1 ADMISSION DATE:  03/12/2022, CONSULTATION DATE:  03/12/2022 REFERRING MD:  Armandina Gemma - EDP, CHIEF COMPLAINT:  Seizure activity   History of Present Illness:  82 year old man residing at [place] who presented to [facility] 2/21 for possible seizure activity. 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 Multiple recent admissions to [facility] with recurrent sepsis, infection with multidrug-resistant organisms (ESBL, Pseudomonas, Klebsiella, Enterococcus)  Patient was recently admitted from 2/7 - 2/12 with encephalopathy, concern for seizures and PsA VAP with ESBL E. Coli UTI.  Treated Zyvox and meropenem and discharged back to [facility].  EEG was negative and he was at his baseline mental status at the time of discharge, awake and following commands.   On 2/21, patient reportedly had seizure activity while at [facility]; no clear history available but presented to the ED with AMS. CT Head was negative, he was hemodynamically stable with labs significant for LA 1.7, ammonia 107, WBC 17.6, glucose 152, EEG completed but not read yet. Loaded with Keppra and treated empiric meningitis with acyclovir, meropenem, and vancomycin.  PCCM consulted for admission.  Pertinent Medical History:   Past Medical History:  Diagnosis Date   CHF (congestive heart failure) ([place])    Coronary artery disease    Essential tremor    GI bleed 2019   hospitalized at [facility] for one week   Headache    since childhood   Low blood sugar    since childhood, controlled by diet   Mitral regurgitation    Mitral valve prolapse    Osteopenia 2021   Paroxysmal atrial fibrillation ([place])    Tremor    Significant Hospital Events: Including procedures, antibiotic start and stop dates in addition to other  pertinent events   2/21 Admit to PCCM. 2/22 CT Head NAICA, generalized atrophy. STAT EEG without seizure activity. Persistent Parkinsonian tremoring of BUE. Urinary retention with UA c/f persistent infection. PEG site erythematous, indurated and with pus present. CT A/P demonstrating air-fluid levels in distended colon, ?ileus vs. Volvulus, bibasilar airspace opacities, PEG in second portion of duodenum, numerous thoracic compression fx, multiple nonobstructing renal stones, bladder outlet obstruction (chronic). Keppra stopped, LTM EEG to r/o seizure. Mero -> Zosyn in the setting of seizure risk.  Interim History / Subjective:  Admitted overnight for possible seizure activity Neuro following, question seizures; tremoring appears Parkinsonian LTM EEG, stopping Keppra to determine if seizures actually occurring CT A/P as above, obtained in the setting of abdominal tenderness/grossly erythematous/indurated and draining PEG site and minimal UOP requiring I&O cath UA with c/f persistent infection, PEG site also a concern as well as HCAP Continuing broad spectrum abx (Zyvox, Zosyn) Foley for persistent retention  Objective:  Blood pressure 108/62, pulse 72, temperature 97.8 F (36.6 C), temperature source Axillary, resp. rate 20, height 5\' 10"  (1.778 m), weight 52.4 kg, SpO2 100 %.    Vent Mode: PCV FiO2 (%):  [40 %-100 %] 40 % Set Rate:  [20 bmp] 20 bmp PEEP:  [5 cmH20] 5 cmH20 Plateau Pressure:  [28 cmH20-29 cmH20] 28 cmH20   Intake/Output Summary (Last 24 hours) at 03/13/2022 0744 Last data filed at 03/13/2022 0600 Gross per 24 hour  Intake 2935.4 ml  Output 601 ml  Net 2334.4 ml    Filed Weights   03/12/22 1940  Weight: 52.4 kg  Physical Examination: General: Chronically ill-appearing elderly man in NAD. Intermittently increased tremulousness with stimulation. HEENT: [place]/AT, anicteric sclera, PERRL, slight upward gaze preference but will track examiner, dry mucous membranes. Neck:  Trach in place without surrounding erythema or drainage.  Neuro:  Awake, mouthing answers to questions, difficult to assess orientation.  Responds to verbal stimuli. Following commands intermittently. Moves all 4 extremities spontaneously. Generalized tremulousness especially in BUE. +Cough and +Gag  CV: RRR, no m/g/r. PULM: Breathing even and unlabored on vent (PCV). Lung fields coarse throughout. GI: Soft nondistended, generalized TTP most notable around PEG site. Hypoactive bowel sounds. Extremities: Trace bilateral symmetric LE edema noted. Skin: Warm/dry, PEG site grossly erythematous and indurated with yellow-brown malodorous purulent discharge.  Resolved Hospital Problem List:    Assessment & Plan:   Acute Encephalopathy Seizure activity History of Parkinson disease Elevated Ammonia CT Head 2/21 NAICA. - Neuro following, appreciate assistance with management - S/p Keppra load, STAT EEG without e/o seizure - AEDs discontinued (Keppra, valproic acid) - LTM EEG to assess if seizure activity actually occurring - Lactulose for elevated ammonia - LFTs uptrending, continuing to monitor  History of multiple admissions for recurrent ESBL infections Recent PsA HCAP and ESBL E. Coli UTI Chronic bladder outlet obstruction - Goal MAP > 65 - Fluid resuscitation as tolerated - Trend WBC, fever curve - F/u Cx data - Doubt meningitis, acyclovir discontinued - Continue broad-spectrum Zosyn/Zyvox in the setting of recent MDR infections; transitioned from mero in the setting of increased seizure risk - Foley placement, reculture urine  Abdominal pain Concern for PEG tube site infection CT A/P demonstrating air-fluid levels in distended colon, ?ileus vs. Volvulus, bibasilar airspace opacities, PEG in second portion of duodenum, numerous thoracic compression fx, multiple nonobstructing renal stones, bladder outlet obstruction (chronic). - Foley placed as above - Consider IR replacement of  PEG, given appearance of site/sepsis - WOC consult  Chronic hypoxic respiratory failure Tracheostomy dependent  - Continue full vent support per home PCV settings - Wean FiO2 for O2 sat > 90% - VAP bundle - Pulmonary hygiene - Trach care per protocol  Hx atrial fibrillation Sinus on admission, not anti-coagulated secondary to hx of GIB. - Cardiac monitoring - Optimize electrolytes (K > 4, Mg > 2)  Hypothyroidism - Continue Synthroid  Best Practice: (right click and "Reselect all SmartList Selections" daily)   Diet/type: NPO DVT prophylaxis: prophylactic heparin  GI prophylaxis: PPI Lines: N/A Foley:  N/A Code Status:  full code Last date of multidisciplinary goals of care discussion [pending]  Critical care time: 47 minutes   The patient is critically ill with multiple organ system failure and requires high complexity decision making for assessment and support, frequent evaluation and titration of therapies, advanced monitoring, review of radiographic studies and interpretation of complex data.   Critical Care Time devoted to patient care services, exclusive of separately billable procedures, described in this note is 47 minutes.  Lestine Mount, PA-C [facility] 03/13/22 7:53 AM  Please see [email or web address] for pager details.  From 7A-7P if no response, please call [phone] After hours, please call ELink [phone]

## 2022-03-13 NOTE — Progress Notes (Signed)
eLink Physician-Brief Progress Note Patient Name: Austin Davis DOB: 1940/06/02 MRN: [id number]   Date of Service  03/13/2022  HPI/Events of Note  Patient admitted from [facility] with altered mental status, concern for seizures, r/o sepsis with meningitis.  eICU Interventions  New Patient Evaluation.        Austin Davis 03/13/2022, 1:35 AM

## 2022-03-13 NOTE — Progress Notes (Signed)
Consult for USGPIV for vasopressors. Patient assessed earlier in shift on bilateral forearms with [place]. No veins suitable for USGPIV noted. Primary RN notified.

## 2022-03-13 NOTE — Progress Notes (Deleted)
LTM EEG hooked up and running - no initial skin breakdown - push button tested - [facility] monitoring.  

## 2022-03-13 NOTE — Progress Notes (Signed)
Initial Nutrition Assessment  DOCUMENTATION CODES:   Severe malnutrition in context of chronic illness, Underweight  INTERVENTION:   Recommend the following regimen when EN consult in place:   - Start Osmolite 1.5 @ 20 ml/hr and advance rate by 10 ml q 4 hours to goal rate of 60 ml/hr (1440 ml/day)   Tube feeding regimen at goal rate provides 2160 kcal, 90 grams of protein, and 1097 ml of H2O.    - 1 packet Juven BID per tube, each packet provides 95 calories, 2.5 grams of protein, and 9.8 grams of carbohydrate; also contains L-arginine and L-glutamine, vitamin C, vitamin E, vitamin B-12, zinc, calcium, and calcium Beta-hydroxy-Beta-methylbutyrate to support wound healing   - MVI with minerals daily per tube  NUTRITION DIAGNOSIS:   Severe Malnutrition related to chronic illness as evidenced by severe fat depletion, severe muscle depletion.  GOAL:   Provide needs based on ASPEN/SCCM guidelines  MONITOR:   TF tolerance  REASON FOR ASSESSMENT:   Ventilator    ASSESSMENT:   82 y.o. male admits from [facility] related to seizure-like activity. PMH includes: CHF, CAD, essential tremor, GI bleed, mitral regurgitation, mitral valve prolapse, osteopenia, paroxysmal afib.  Meds reviewed. Labs reviewed: Na low, chloride low, BUN elevated.   Pt is trach/vent dependent. Pt with previous admission. Pt previously received continuous feeds of Osmolite 1.5. The previous regimen still meets the pt's needs. No EN consult yet from MD/PA. RD has sent message and will wait for response prior to placing order. Weights fluctuate but are overall stable over the past 2 years. RD will continue to monitor POC.   NUTRITION - FOCUSED PHYSICAL EXAM:  Flowsheet Row Most Recent Value  Orbital Region Severe depletion  Upper Arm Region Severe depletion  Thoracic and Lumbar Region Severe depletion  Buccal Region Severe depletion  Temple Region Severe depletion  Clavicle Bone Region Severe depletion   Clavicle and Acromion Bone Region Severe depletion  Scapular Bone Region Severe depletion  Dorsal Hand Severe depletion  Patellar Region Severe depletion  Anterior Thigh Region Severe depletion  Posterior Calf Region Severe depletion  Edema (RD Assessment) Moderate  Hair Reviewed  Eyes Reviewed  Mouth Reviewed  Skin Reviewed  Nails Reviewed       Diet Order:   Diet Order             Diet NPO time specified  Diet effective now                   EDUCATION NEEDS:   Not appropriate for education at this time  Skin:  Skin Assessment: Skin Integrity Issues: Skin Integrity Issues:: Stage II Stage II: Left buttocks  Last BM:  PTA  Height:   Ht Readings from Last 1 Encounters:  03/12/22 5\' 10"  (1.778 m)    Weight:   Wt Readings from Last 1 Encounters:  03/13/22 52.4 kg    Ideal Body Weight:     BMI:  Body mass index is 16.58 kg/m.  Estimated Nutritional Needs:   Kcal:  2979-8921 kcals  Protein:  90-105 gm  Fluid:  >/= 1.8 L  Thalia Bloodgood, RD, LDN, CNSC.

## 2022-03-14 ENCOUNTER — Inpatient Hospital Stay (HOSPITAL_COMMUNITY): Payer: Medicare Other

## 2022-03-14 DIAGNOSIS — R569 Unspecified convulsions: Secondary | ICD-10-CM | POA: Diagnosis not present

## 2022-03-14 LAB — CBC
HCT: 28.6 % — ABNORMAL LOW (ref 39.0–52.0)
Hemoglobin: 9.1 g/dL — ABNORMAL LOW (ref 13.0–17.0)
MCH: 26.5 pg (ref 26.0–34.0)
MCHC: 31.8 g/dL (ref 30.0–36.0)
MCV: 83.1 fL (ref 80.0–100.0)
Platelets: 421 10*3/uL — ABNORMAL HIGH (ref 150–400)
RBC: 3.44 MIL/uL — ABNORMAL LOW (ref 4.22–5.81)
RDW: 21.6 % — ABNORMAL HIGH (ref 11.5–15.5)
WBC: 11.9 10*3/uL — ABNORMAL HIGH (ref 4.0–10.5)
nRBC: 0 % (ref 0.0–0.2)

## 2022-03-14 LAB — PHOSPHORUS: Phosphorus: 2.8 mg/dL (ref 2.5–4.6)

## 2022-03-14 LAB — MAGNESIUM
Magnesium: 1.8 mg/dL (ref 1.7–2.4)
Magnesium: 1.9 mg/dL (ref 1.7–2.4)

## 2022-03-14 LAB — COMPREHENSIVE METABOLIC PANEL
ALT: 42 U/L (ref 0–44)
AST: 26 U/L (ref 15–41)
Albumin: 2 g/dL — ABNORMAL LOW (ref 3.5–5.0)
Alkaline Phosphatase: 96 U/L (ref 38–126)
Anion gap: 10 (ref 5–15)
BUN: 14 mg/dL (ref 8–23)
CO2: 29 mmol/L (ref 22–32)
Calcium: 8.7 mg/dL — ABNORMAL LOW (ref 8.9–10.3)
Chloride: 93 mmol/L — ABNORMAL LOW (ref 98–111)
Creatinine, Ser: 0.38 mg/dL — ABNORMAL LOW (ref 0.61–1.24)
GFR, Estimated: >60 mL/min (ref >60)
Glucose, Bld: 85 mg/dL (ref 70–99)
Potassium: 2.8 mmol/L — ABNORMAL LOW (ref 3.5–5.1)
Sodium: 132 mmol/L — ABNORMAL LOW (ref 135–145)
Total Bilirubin: 0.8 mg/dL (ref 0.3–1.2)
Total Protein: 6.9 g/dL (ref 6.5–8.1)

## 2022-03-14 MED ORDER — METOPROLOL TARTRATE 5 MG/5ML IV SOLN
INTRAVENOUS | Status: AC
  Administered 2022-03-14: 5 mg via INTRAVENOUS
  Filled 2022-03-14: qty 5

## 2022-03-14 MED ORDER — PANTOPRAZOLE SODIUM 40 MG IV SOLR
40.0000 mg | Freq: Two times a day (BID) | INTRAVENOUS | Status: DC
  Administered 2022-03-14 – 2022-03-18 (×9): 40 mg via INTRAVENOUS
  Filled 2022-03-14 (×9): qty 10

## 2022-03-14 MED ORDER — MIDAZOLAM HCL 2 MG/2ML IJ SOLN: 1.0000 mg | INTRAMUSCULAR | Status: DC | PRN

## 2022-03-14 MED ORDER — FENTANYL CITRATE PF 50 MCG/ML IJ SOSY
12.5000 ug | PREFILLED_SYRINGE | INTRAMUSCULAR | Status: DC | PRN
  Administered 2022-03-14: 25 ug via INTRAVENOUS
  Administered 2022-03-14: 12.5 ug via INTRAVENOUS
  Administered 2022-03-15 – 2022-03-18 (×9): 25 ug via INTRAVENOUS
  Filled 2022-03-14 (×11): qty 1

## 2022-03-14 MED ORDER — FUROSEMIDE 10 MG/ML IJ SOLN
40.0000 mg | Freq: Once | INTRAMUSCULAR | Status: AC
  Administered 2022-03-14: 40 mg via INTRAVENOUS
  Filled 2022-03-14: qty 4

## 2022-03-14 MED ORDER — POTASSIUM CHLORIDE 10 MEQ/100ML IV SOLN
10.0000 meq | INTRAVENOUS | Status: AC
  Administered 2022-03-14 – 2022-03-15 (×8): 10 meq via INTRAVENOUS
  Filled 2022-03-14 (×8): qty 100

## 2022-03-14 MED ORDER — POTASSIUM CHLORIDE 20 MEQ PO PACK
40.0000 meq | PACK | Freq: Once | ORAL | Status: DC
  Filled 2022-03-14: qty 2

## 2022-03-14 MED ORDER — MAGNESIUM SULFATE 2 GM/50ML IV SOLN
2.0000 g | Freq: Once | INTRAVENOUS | Status: AC
  Administered 2022-03-14: 2 g via INTRAVENOUS
  Filled 2022-03-14: qty 50

## 2022-03-14 MED ORDER — POTASSIUM CHLORIDE 10 MEQ/100ML IV SOLN
10.0000 meq | INTRAVENOUS | Status: AC
  Administered 2022-03-14 (×4): 10 meq via INTRAVENOUS
  Filled 2022-03-14 (×4): qty 100

## 2022-03-14 MED ORDER — SODIUM CHLORIDE 0.9 % IV SOLN
100.0000 mg | INTRAVENOUS | Status: DC
  Administered 2022-03-14 – 2022-03-16 (×3): 100 mg via INTRAVENOUS
  Filled 2022-03-14 (×3): qty 5

## 2022-03-14 MED ORDER — SODIUM CHLORIDE 0.9 % IV SOLN
1.0000 g | Freq: Three times a day (TID) | INTRAVENOUS | Status: DC
  Administered 2022-03-14 – 2022-03-18 (×13): 1 g via INTRAVENOUS
  Filled 2022-03-14 (×13): qty 20

## 2022-03-14 MED ORDER — METOPROLOL TARTRATE 5 MG/5ML IV SOLN: 5.0000 mg | Freq: Once | INTRAVENOUS | Status: AC

## 2022-03-14 NOTE — Progress Notes (Signed)
NAME:  Austin Davis, MRN:  [id number], DOB:  11-Feb-1940, LOS: 2 ADMISSION DATE:  03/12/2022, CONSULTATION DATE:  03/12/2022 REFERRING MD:  Armandina Gemma - EDP, CHIEF COMPLAINT:  Seizure activity   History of Present Illness:  82 year old man residing at [place] who presented to [facility] 2/21 for possible seizure activity. 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 Multiple recent admissions to [facility] with recurrent sepsis, infection with multidrug-resistant organisms (ESBL, Pseudomonas, Klebsiella, Enterococcus)  Patient was recently admitted from 2/7 - 2/12 with encephalopathy, concern for seizures and PsA VAP with ESBL E. Coli UTI.  Treated Zyvox and meropenem and discharged back to [facility].  EEG was negative and he was at his baseline mental status at the time of discharge, awake and following commands.   On 2/21, patient reportedly had seizure activity while at [facility]; no clear history available but presented to the ED with AMS. CT Head was negative, he was hemodynamically stable with labs significant for LA 1.7, ammonia 107, WBC 17.6, glucose 152, EEG completed but not read yet. Loaded with Keppra and treated empiric meningitis with acyclovir, meropenem, and vancomycin.  PCCM consulted for admission.  Pertinent Medical History:   Past Medical History:  Diagnosis Date   CHF (congestive heart failure) ([place])    Coronary artery disease    Essential tremor    GI bleed 2019   hospitalized at [facility] for one week   Headache    since childhood   Low blood sugar    since childhood, controlled by diet   Mitral regurgitation    Mitral valve prolapse    Osteopenia 2021   Paroxysmal atrial fibrillation ([place])    Tremor    Significant Hospital Events: Including procedures, antibiotic start and stop dates in addition to other  pertinent events   2/21 Admit to PCCM. 2/22 CT Head NAICA, generalized atrophy. STAT EEG without seizure activity. Persistent Parkinsonian tremoring of BUE. Urinary retention with UA c/f persistent infection. PEG site erythematous, indurated and with pus present. CT A/P demonstrating air-fluid levels in distended colon, ?ileus vs. Volvulus, bibasilar airspace opacities, PEG in second portion of duodenum, numerous thoracic compression fx, multiple nonobstructing renal stones, bladder outlet obstruction (chronic). Keppra stopped, LTM EEG to r/o seizure. Mero -> Zosyn in the setting of seizure risk. 2/23 Low grade fever, tachy with ?Afib RVR to 140s; resolved with metop '5mg'$  x 1. PEG with dark brown ?feculent discharge. IR consulted for eval/exchange. Mica added for fungal coverage; ID consulted. LTM EEG ongoing.  Interim History / Subjective:  No significant events overnight More lucid and interactive today Low grade temp with Tmax 99.7, appears flushed/diaphoretic Increased HR, ?Afib with rates to 140s Metop '5mg'$  IV x 1 given with improved HR PEG tube with change in discharge from purulent to ?feculent Mica added for fungal coverage ID consulted, given history of MDRO LTM EEG ongoing  Objective:  Blood pressure (!) 134/90, pulse (!) 103, temperature 99.7 F (37.6 C), temperature source Axillary, resp. rate 16, height '5\' 10"'$  (1.778 m), weight 58.1 kg, SpO2 94 %.    Vent Mode: PCV FiO2 (%):  [40 %-50 %] 50 % Set Rate:  [20 bmp] 20 bmp PEEP:  [5 cmH20] 5 cmH20 Plateau Pressure:  [27 cmH20-30 cmH20] 30 cmH20   Intake/Output Summary (Last 24 hours) at 03/14/2022 0829 Last data filed at 03/14/2022 0600 Gross per 24  hour  Intake 3279 ml  Output 1060 ml  Net 2219 ml    Filed Weights   03/12/22 1940 03/13/22 1100 03/14/22 0500  Weight: 52.4 kg 52.4 kg 58.1 kg   Physical Examination: General: Acute-on-chronically ill-appearing elderly man in NAD. Flushed/sweaty, more awake/conversant. HEENT:  [place]/AT, anicteric sclera, PERRL, moist mucous membranes. Neck: Trach in place without surrounding erythema/drainage, thick yellow secretions in in-line suction. Neuro:  Awake, appears oriented. Nodding and mouthing answers to questions appropriately.  Responds to verbal stimuli. Following commands consistently. Moves all 4 extremities spontaneously. Generalized weakness/deconditioning, +tremor to BUE/occasional BLE. CV: Irregular rhythm, rate 90s, no m/g/r. PULM: Breathing even and minimally labored on vent (PCV). Lung fields with coarse rhonchi throughout, especially in upper fields. GI: Soft, mildly distended. Diffusely TTP but most notable around PEG site. PEG with dark brown ?feculent drainage around insertion site and diffuse erythema. Hypoactive bowel sounds. Extremities: Trace symmetric BLE edema noted. Skin: Warm/dry, erythema noted around PEG site, no other rashes.  Resolved Hospital Problem List:    Assessment & Plan:   Acute Encephalopathy Seizure activity History of Parkinson's disease Elevated Ammonia CT Head 2/21 NAICA. - Neuro following, appreciate assistance with management - S/p Keppra load, spot EEG w/o evidence of seizure - LTM EEG ongoing, no e/o seizures at present - AEDs discontinued per Neuro - Lactulose discontinued in the setting of ?ileus/volvulus, PEG tube drainage; mental status has improved - Trend LFTs  History of multiple admissions for recurrent ESBL infections Recent PsA HCAP and ESBL E. Coli UTI Chronic bladder outlet obstruction - Goal MAP > 65 - Fluid resuscitation as tolerated - Not requiring pressors at present - Maintain Foley for now, given possible bladder outlet obstruction/infection - Trend WBC, fever curve - F/u Cx data - Continue broad-spectrum antibiotics (Zosyn, Zyvox) - Micafungin added for fungal coverage - ID consulted in the setting of multiple recent MDROs  Abdominal pain ?Ileus vs. volvulus Concern for PEG tube site  infection History of GIB CT A/P demonstrating air-fluid levels in distended colon, ?ileus vs. Volvulus, bibasilar airspace opacities, PEG in second portion of duodenum, numerous thoracic compression fx, multiple nonobstructing renal stones, bladder outlet obstruction (chronic). - Foley as above - PPI BID for increased dark brown possibly feculent PEG tube site drainage - IR consulted for eval/exchange of PEG, given appearance of site and possible sepsis - WOC consult  Chronic hypoxic respiratory failure Tracheostomy dependent ?HCAP - Continue full vent support per home PCV settings - Wean FiO2 for sat > 90% - VAP bundle - Pulmonary hygiene - Trach care per protocol - Follow CXR - Coverage for HCAP with abx (as above) - F/u Resp Cx/TA  Hx atrial fibrillation Sinus on admission, not anti-coagulated secondary to hx of GIB. - Cardiac monitoring - Optimize electrolytes (K > 4, Mg > 2) - Metop '5mg'$  IV x 1 given 2/23AM for HR 140s, ?Afib - EKG demonstrating ST with PACs  Hypothyroidism - Continue levothyroxine  Best Practice: (right click and "Reselect all SmartList Selections" daily)   Diet/type: NPO DVT prophylaxis: prophylactic heparin  GI prophylaxis: PPI Lines: N/A Foley:  N/A Code Status:  full code Last date of multidisciplinary goals of care discussion [pending]  Critical care time: 41 minutes   The patient is critically ill with multiple organ system failure and requires high complexity decision making for assessment and support, frequent evaluation and titration of therapies, advanced monitoring, review of radiographic studies and interpretation of complex data.   Critical Care Time devoted to patient  care services, exclusive of separately billable procedures, described in this note is 41 minutes.  Lestine Mount, PA-C [facility] 03/14/22 8:29 AM  Please see [email or web address] for pager details.  From 7A-7P if no response, please call  [phone] After hours, please call ELink [phone]

## 2022-03-14 NOTE — Progress Notes (Signed)
Noted patient HR in 140's - 150's and irregular rhythm. Patient also gesturing to chest and indicating that he felt discomfort and appeared diaphoretic. Unable to identify if patient in afib due to artifact from constant tremors. Obtained EKG which showed sinus tach and notified NP. Obtained order for 5 mg of metoprolol which was administered. Patient heart rate now 90's and patient other vitals WNL.

## 2022-03-14 NOTE — Procedures (Signed)
Patient Name: Austin Davis  MRN: [id number]  Epilepsy Attending: Lora Havens  Referring Physician/Provider: Lorenza Chick, MD  Duration: 03/13/2022 1355 to 03/14/2022 1355   Patient history: 82yo M with h/o history of seizure like episodes in the past. EEG to evaluate for seizure.    Level of alertness:  awake   AEDs during EEG study: None   Technical aspects: This EEG study was done with scalp electrodes positioned according to the 10-20 International system of electrode placement. Electrical activity was reviewed with band pass filter of 1-'70Hz'$ , sensitivity of 7 uV/mm, display speed of 62m/sec with a '60Hz'$  notched filter applied as appropriate. EEG data were recorded continuously and digitally stored.  Video monitoring was available and reviewed as appropriate.   Description: The posterior dominant rhythm consists of 8-9 Hz activity of moderate voltage (25-35 uV) seen predominantly in posterior head regions, symmetric and reactive to eye opening and eye closing. EEG showed intermittent generalized 3 to 6 Hz theta-delta slowing. Hyperventilation and photic stimulation were not performed.     Patient was noted to have near continuous lower jaw tremor when patient was awake. Concomitant EEG before, during and after the event did not show any EEG changes suggest seizure.    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 No seizures or epileptiform discharges were seen throughout the recording.  Patient was noted to have near continuous lower jaw tremor when patient was awake without concomitant EEG change. 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   [name] [name]

## 2022-03-14 NOTE — Progress Notes (Signed)
Neurology Progress Note  Patient ID: Austin Davis is a 82 y.o. with PMHx of  has a past medical history of CHF (congestive heart failure) ([place]), Coronary artery disease, Essential tremor, GI bleed 2017/04/03), Headache, Low blood sugar, Mitral regurgitation, Mitral valve prolapse, Osteopenia 2019-04-04), Paroxysmal atrial fibrillation ([place]), and Tremor.  Subjective: - More alert / communicate but difficult to understand due to mouth tremor - Reports slight headache, improved belly pain  Exam: Current vital signs: BP (!) 123/97   Pulse (!) 103   Temp 99.7 F (37.6 C) (Axillary)   Resp (!) 23   Ht '5\' 10"'$  (1.778 m)   Wt 58.1 kg   SpO2 92%   BMI 18.38 kg/m  Vital signs in last 24 hours: Temp:  [99 F (37.2 C)-99.8 F (37.7 C)] 99.7 F (37.6 C) (02/23 0400) Pulse Rate:  [79-103] 103 (02/23 0327) Resp:  [18-26] 23 (02/23 0700) BP: (92-134)/(62-97) 123/97 (02/23 0700) SpO2:  [91 %-100 %] 92 % (02/23 0700) FiO2 (%):  [40 %] 40 % (02/23 0327) Weight:  [52.4 kg-58.1 kg] 58.1 kg (02/23 0500)  Physical Exam  General: Chronically ill-appearing,  HEENT-temporal muscle wasting Cardiovascular- S1-S2 audible, pulses palpable throughout   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 Nods appropriate to age by choices. Unaware of location but able to mouth [facility] when I ask him to repeat after me Cranial Nerves: II: Orients to stimuli in all fields  III,IV, VI: Pupils equal round reactive 4 mm to 2 mm, orients to examiner bilaterally, clearly looks more readily to the right than to the left, saccadic pursuits V,VII: Face symmetric, sensation intact  bilateral face VIII: hearing intact to voice Motor: Diffuse atrophy.  Antigravity with the bilateral upper extremities and gestures with the thumb spontaneously.  Squeezes and lets go with both hands a little stronger on the right than the left but brisker today.  1/5 bilateral legs, again still brisker on the right than the left Sensory: Grossly equally reactive to light touch throughout  Pertinent Labs:  Basic Metabolic Panel: Recent Labs  Lab 03/12/22 Apr 04, 2051 03/12/22 04/03/2098 03/14/22 0604  NA 135 133* 132*  K 4.9 5.1 2.8*  CL 92*  --  93*  CO2 35*  --  29  GLUCOSE 152*  --  85  BUN 27*  --  14  CREATININE 0.37*  --  0.38*  CALCIUM 9.0  --  8.7*  MG  --   --  1.8  PHOS  --   --  2.8    CBC: Recent Labs  Lab 03/12/22 04-04-51 03/12/22 04-04-98 03/14/22 0604  WBC 17.6*  --  11.9*  NEUTROABS 16.9*  --   --   HGB 10.7* 11.9* 9.1*  HCT 35.9* 35.0* 28.6*  MCV 88.6  --  83.1  PLT 451*  --  421*    Coagulation Studies: Recent Labs    03/12/22 04/04/2051  LABPROT 14.3  INR 1.1    LTM EEG read pending   Assessment:  - Exam reveals patient who seems nearly at his baseline mental status perhaps with some delirium (poor attention/concentration) in the setting of acute infection, baseline mouth and bilateral arm tremors consistent with his baseline  diagnosis of essential tremor - Potential PEG site infection as well as potential UTI - EEG read pending - He has had events prior concerning for possible focal seizure but given his mental status at baseline as well as his baseline tremor, unclear if these are true seizure activities or cognitive fluctuation in the setting of some dementia and poor cognitive reserve as well as frequent encephalopathy secondary to frequent infections / hypoxic events - He is noted to tend to prefer looking to the right by myself and bedside nurse so even less convinced that prior right gaze deviation "episodes" were truly focal seizures  Impression: Intermittent  toxic/metabolic encephalopathy secondary to uremia, infections (previously hypoxia) vs. underlying epilepsy Addition and up titration of Keppra does not seem to have made a difference in these events.  In fact I query whether it may be contributing to these effects due to the sedating effects of the medication without a clear diagnosis of underlying epilepsy.   Continues on LTM EEG to clarify diagnosis   Recommendations: - Appreciate management of comorbidities per CCM team - Continue to hold keppra for now, may be adjusted pending EEG read - Continue LTM EEG - Neurology will continue to follow  Lesleigh Noe MD-PhD [facility] [phone]

## 2022-03-14 NOTE — Progress Notes (Signed)
[facility] ADULT ICU REPLACEMENT PROTOCOL   The patient does apply for the [facility] Adult ICU Electrolyte Replacment Protocol based on the criteria listed below:   1.Exclusion criteria: TCTS, ECMO, Dialysis, and Myasthenia Gravis patients 2. Is GFR >/= 30 ml/min? Yes.    Patient's GFR today is >60 3. Is SCr </= 2? Yes.   Patient's SCr is 0.48 mg/dL 4. Did SCr increase >/= 0.5 in 24 hours? No. 5.Pt's weight >40kg  Yes.   6. Abnormal electrolyte(s): k 2.4  7. Electrolytes replaced per protocol 8.  Call MD STAT for K+ </= 2.5, Phos </= 1, or Mag </= 1 Physician:  Constance Holster, Ruhani Umland A 03/14/2022 11:22 PM

## 2022-03-14 NOTE — Progress Notes (Signed)
Nutrition Quick Note:   Unable to start feeds via current PEG due to possible infection and drainage from PEG. RD reached out to MD/PA. Current plan is for IR evaluation for PEG replacement. If IR unable to place PEG, PA states that the plan could be for possible Cortrak or NG tube placement. RD will continue to monitor POC.   Please see RD's full note from 2/22 for EN recommendations.   Thalia Bloodgood, RD, LDN, CNSC.

## 2022-03-14 NOTE — Progress Notes (Signed)
vLTM maintenance  All impedances below 10kohms.  No skin breakdown noted at all skin sites 

## 2022-03-14 NOTE — Consult Note (Signed)
[facility] for Infectious Disease    Date of Admission:  03/12/2022     Reason for Consult:  Infection     Referring Physician: Dr Tacy Learn  Current antibiotics: Linezolid Piperacillin/tazobactam  ASSESSMENT:    82 y.o. male admitted with:  Suspected sepsis of unclear source: Potential etiologies include pneumonia, G-tube infection, urinary source. History of multidrug-resistant bacteria Seizure-like activity Chronic respiratory failure/trach dependent Foley catheter placed in the setting of bladder outlet obstruction PEG tube dependent with concern for site infection  RECOMMENDATIONS:    Continue Linezolid Will change Zosyn to Meropenem based on prior cultures recognizing the potential for increased seizure risk Okay to continue anti-fungals for now Appreciate IR evaluation of his PEG tube and consideration of exchange given appearance of the site and possible drainage Follow cultures, hopefully can peel back antibiotics soon Dr Candiss Norse here over the weekend   Principal Problem:   Seizure ([place])   MEDICATIONS:    Scheduled Meds:  Chlorhexidine Gluconate Cloth  6 each Topical Daily   heparin  5,000 Units Subcutaneous Q8H   mouth rinse  15 mL Mouth Rinse Q2H   pantoprazole (PROTONIX) IV  40 mg Intravenous Q12H   potassium chloride  40 mEq Per Tube Once   Continuous Infusions:  sodium chloride     linezolid (ZYVOX) IV Stopped (03/14/22 1132)   micafungin (MYCAMINE) 100 mg in sodium chloride 0.9 % 100 mL IVPB 100 mg (03/14/22 1200)   norepinephrine (LEVOPHED) Adult infusion     piperacillin-tazobactam (ZOSYN)  IV Stopped (03/14/22 0953)   potassium chloride 10 mEq (03/14/22 1314)   PRN Meds:.acetaminophen (TYLENOL) oral liquid 160 mg/5 mL, docusate, midazolam, polyethylene glycol  HPI:    Austin Davis is a 82 y.o. male with with complicated past medical history and multiple recurrent hospitalizations with history of MDR organism colonization.  He is  trach/vent dependent and resides at [facility].  He was transferred from there 2 days ago after presenting with "seizure-like activity".  There was concern for sepsis as an etiology based on presentation.  Possible sources included urine versus skin/soft tissue infection from around his G-tube insertion site versus PNA.  His nurse this morning reports drainage around his G-tube that appears possibly feculent.  He was started on linezolid and meropenem.  This was changed to Zosyn based on seizure concern.  Initially he had a leukocytosis of 17.6 which appears fairly stable from previous measurements.  This has improved as of this morning to 11.9.  Blood, urine, and sputum cultures are pending.  Of note, urinalysis was obtained after Foley placement and did not appear grossly infected.  Due to the concern for his G-tube infection and/or malfunctioning, a CT abdomen/pelvis was obtained.  This did not demonstrate any appreciable fluid collection but does note a possible large bowel ileus.  His gastrostomy tube was noted to be inflated within the second portion of the duodenum and repositioning was recommended to be considered.  Interventional radiology has been consulted today for this purpose.  He otherwise has been afebrile and remains on the ventilator.  Neurology has been consulted due to this possible seizure activity.  EEG was negative.   Past Medical History:  Diagnosis Date   CHF (congestive heart failure) (H[facility])    Coronary artery disease    Essential tremor    GI bleed 2019   hospitalized at [facility] for one week   Headache    since childhood   Low blood sugar  since childhood, controlled by diet   Mitral regurgitation    Mitral valve prolapse    Osteopenia 2021   Paroxysmal atrial fibrillation ([facility])    Tremor     Social History   Tobacco Use   Smoking status: Never   Smokeless tobacco: Never   Tobacco comments:    only tried a few cigarettes when he was younger   Substance Use Topics   Alcohol use: Yes    Comment: "probably a 6 pack of beer a year"   Drug use: No    Family History  Problem Relation Age of Onset   Heart disease Mother    CAD Mother    Migraines Mother    Heart failure Father    Skin cancer Father    Valvular heart disease Father        prolapsed valve   Tremor Father        "probably had a bit of tremor in his old age"   Heart failure Maternal Grandmother    Pneumonia Maternal Grandfather    Heart failure Paternal Grandmother    Stroke Paternal Grandfather    Asthma Daughter    Epilepsy Son    Parkinson's disease Neg Hx     Allergies  Allergen Reactions   Prednisone Other (See Comments)    Listed as allergy on MAR Unknown reaction   Cortisone Other (See Comments)    Listed as allergy per MAR Unknown reaction    Review of Systems  Unable to perform ROS: Intubated    OBJECTIVE:   Blood pressure (!) 133/92, pulse (!) 103, temperature 98.3 F (36.8 C), temperature source Axillary, resp. rate (!) 21, height '5\' 10"'$  (1.778 m), weight 58.1 kg, SpO2 100 %. Body mass index is 18.38 kg/m.  Physical Exam Constitutional:      Comments: Very chronically ill-appearing man, lying in the hospital bed, receiving care from 2 nurses  HENT:     Head: Normocephalic and atraumatic.  Eyes:     Extraocular Movements: Extraocular movements intact.     Conjunctiva/sclera: Conjunctivae normal.  Neck:     Comments: Tracheostomy in place Pulmonary:     Effort: Pulmonary effort is normal. No respiratory distress.  Abdominal:     Comments: G-tube site with brown-colored drainage saturating the dressing with some surrounding erythema  Genitourinary:    Comments: Foley catheter in place Musculoskeletal:     Comments: Bandages covering his backside.  Nurse reports no significant sacral wound  Neurological:     Comments: Unable to assess      Lab Results: Lab Results  Component Value Date   WBC 11.9 (H) 03/14/2022    HGB 9.1 (L) 03/14/2022   HCT 28.6 (L) 03/14/2022   MCV 83.1 03/14/2022   PLT 421 (H) 03/14/2022    Lab Results  Component Value Date   NA 132 (L) 03/14/2022   K 2.8 (L) 03/14/2022   CO2 29 03/14/2022   GLUCOSE 85 03/14/2022   BUN 14 03/14/2022   CREATININE 0.38 (L) 03/14/2022   CALCIUM 8.7 (L) 03/14/2022   GFRNONAA >60 03/14/2022   GFRAA >60 02/17/2017    Lab Results  Component Value Date   ALT 42 03/14/2022   AST 26 03/14/2022   ALKPHOS 96 03/14/2022   BILITOT 0.8 03/14/2022       Component Value Date/Time   CRP 6.0 (H) 06/25/2021 1218       Component Value Date/Time   ESRSEDRATE 9 09/23/2020 1035    I  have reviewed the micro and lab results in [place].  Imaging: Overnight EEG with video  Result Date: 03/14/2022 Lora Havens, MD     03/14/2022  9:58 AM Patient Name: Austin Davis MRN: [id number] Epilepsy Attending: Lora Havens Referring Physician/Provider: Lorenza Chick, MD Duration: 03/13/2022 1355 to 03/14/2022 0950  Patient history: 82yo M with h/o history of seizure like episodes in the past. EEG to evaluate for seizure.  Level of alertness:  awake  AEDs during EEG study: LEV  Technical aspects: This EEG study was done with scalp electrodes positioned according to the 10-20 International system of electrode placement. Electrical activity was reviewed with band pass filter of 1-'70Hz'$ , sensitivity of 7 uV/mm, display speed of 68m/sec with a '60Hz'$  notched filter applied as appropriate. EEG data were recorded continuously and digitally stored.  Video monitoring was available and reviewed as appropriate.  Description: The posterior dominant rhythm consists of 8-9 Hz activity of moderate voltage (25-35 uV) seen predominantly in posterior head regions, symmetric and reactive to eye opening and eye closing. EEG showed intermittent generalized 3 to 6 Hz theta-delta slowing. Hyperventilation and photic stimulation were not performed.   Patient was noted to have near  continuous lower jaw tremor when patient was awake. Concomitant EEG before, during and after the event did not show any EEG changes suggest seizure.  ABNORMALITY - Intermittent slow, generalized  IMPRESSION: This study is suggestive of mild to moderate diffuse encephalopathy, nonspecific etiology. No seizures or epileptiform discharges were seen throughout the recording. Patient was noted to have near continuous lower jaw tremor when patient was awake without concomitant EEG change. 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  PLora Havens DG CHEST PORT 1 VIEW  Result Date: 03/14/2022 CLINICAL DATA:  Shortness of breath. EXAM: PORTABLE CHEST 1 VIEW COMPARISON:  03/12/2022 and 03/03/2022 FINDINGS: Single-view of the chest demonstrates a tracheostomy tube. Median sternotomy wires. Prominent interstitial lung markings are suggestive for chronic changes and minimal change since 03/12/2022. However, the basilar densities have increased since 03/03/2022. Negative for pneumothorax. Heart size is within normal limits and stable. IVC filter is partially visualized. IMPRESSION: Chronic lung changes with increased densities at the lung bases. Findings could represent acute on chronic disease but nonspecific. Minimal change from the recent comparison examination. Electronically Signed   By: AMarkus DaftM.D.   On: 03/14/2022 09:23   CT ABDOMEN PELVIS W CONTRAST  Result Date: 03/13/2022 CLINICAL DATA:  Abdominal pain, sepsis EXAM: CT ABDOMEN AND PELVIS WITH CONTRAST TECHNIQUE: Multidetector CT imaging of the abdomen and pelvis was performed using the standard protocol following bolus administration of intravenous contrast. RADIATION DOSE REDUCTION: This exam was performed according to the departmental dose-optimization program which includes automated exposure control, adjustment of the mA and/or kV according to patient size and/or use of iterative reconstruction technique. CONTRAST:  723mOMNIPAQUE IOHEXOL 350 MG/ML SOLN  COMPARISON:  CT 10/06/2020, 09/22/2020, chest x-ray 03/12/2022 FINDINGS: Lower chest: Patchy bibasilar airspace opacities. Diffuse bronchial wall thickening. No pleural effusion. Heart size within normal limits. Hepatobiliary: There are a few subcentimeter low-density lesions within the liver, too small to characterize. Probable site of fatty infiltration along the falciform ligament. Otherwise, no focal liver abnormality. Unremarkable gallbladder. No hyperdense gallstone. No biliary dilatation. Pancreas: Unremarkable. No pancreatic ductal dilatation or surrounding inflammatory changes. Spleen: Normal in size without focal abnormality. Adrenals/Urinary Tract: Unremarkable adrenal glands. Multiple punctate nonobstructing renal stones, more numerous on the left. No hydronephrosis. Multiple stones within the dependent portion of the urinary  bladder. Trabeculated appearance of the bladder wall. Stomach/Bowel: There is a percutaneous gastrostomy tube with balloon inflated within the second portion of the duodenum. Stomach is largely decompressed. Oral contrast is present within the small bowel and colon. No dilated loops of small bowel. The colon is moderately distended with the cecum measuring up to 9 cm in diameter. Air-fluid levels within the colon. There partial swirling of the mesentery (series 3, images 52-56) without abrupt colonic caliber change. Enteric contrast traverses the area of swirling. No findings of an acute sigmoid volvulus. This rectum is distended with stool. Mild rectal wall thickening. Vascular/Lymphatic: Aortic atherosclerosis. IVC filter in place. No enlarged abdominal or pelvic lymph nodes. Reproductive: Prostate gland is mildly enlarged. Other: No free fluid. No abdominopelvic fluid collection. No pneumoperitoneum. No abdominal wall hernia. Musculoskeletal: Dextro scoliotic thoracolumbar curvature. Numerous vertebral body compression fractures including moderate superior endplate compression  deformity of T11 and moderate inferior endplate deformity at 624THL, both are new from prior and could be subacute. The additional compression deformities appear to be chronic. The bones are demineralized. IMPRESSION: 1. The colon is moderately distended with multiple air-fluid levels. There is partial swirling of the mesentery in the left lower quadrant without abrupt colonic caliber change. Enteric contrast traverses the area of swirling. A transient or incomplete sigmoid volvulus is not entirely excluded, however the findings may simply represent a colonic ileus. Continued radiographic follow-up is suggested. 2. Patchy bibasilar airspace opacities, likely pneumonia or aspiration. 3. Percutaneous gastrostomy tube with balloon inflated within the second portion of the duodenum. Consider repositioning. 4. Numerous vertebral body compression fractures including moderate superior endplate compression deformity of T11 and moderate inferior endplate deformity at 624THL, both are new from prior and could be subacute. The additional compression deformities appear to be chronic. 5. Multiple punctate nonobstructing renal stones, more numerous on the left. Multiple stones within the dependent portion of the urinary bladder. No hydronephrosis. 6. Trabeculated appearance of the bladder wall suggesting chronic bladder outlet obstruction. 7. Aortic atherosclerosis (ICD10-I70.0). Electronically Signed   By: Davina Poke D.O.   On: 03/13/2022 13:21   EEG adult  Result Date: 03/13/2022 Lora Havens, MD     03/13/2022  8:54 AM Patient Name: Austin Davis MRN: [id number] Epilepsy Attending: Lora Havens Referring Physician/Provider: Regan Lemming, MD Date: 03/12/2022 Duration: 56 mins  Patient history: 82yo M with h/o history of seizure like episodes in the past. EEG to evaluate for seizure.  Level of alertness:  awake, asleep  AEDs during EEG study: LEV  Technical aspects: This EEG study was done with scalp electrodes  positioned according to the 10-20 International system of electrode placement. Electrical activity was reviewed with band pass filter of 1-'70Hz'$ , sensitivity of 7 uV/mm, display speed of 57m/sec with a '60Hz'$  notched filter applied as appropriate. EEG data were recorded continuously and digitally stored.  Video monitoring was available and reviewed as appropriate.  Description: The posterior dominant rhythm consists of 8-9 Hz activity of moderate voltage (25-35 uV) seen predominantly in posterior head regions, symmetric and reactive to eye opening and eye closing. Sleep was characterized by vertex waves, sleep spindles (12 to 14 Hz), maximal frontocentral region. EEG showed intermittent generalized 3 to 6 Hz theta-delta slowing  Hyperventilation and photic stimulation were not performed.    ABNORMALITY - Intermittent slow, generalized  IMPRESSION: This study is suggestive of mild to moderate diffuse encephalopathy, nonspecific etiology. No seizures or epileptiform discharges were seen throughout the recording.  Priyanka [name]  CT HEAD WO CONTRAST (5MM)  Result Date: 03/12/2022 CLINICAL DATA:  Altered mental status. EXAM: CT HEAD WITHOUT CONTRAST TECHNIQUE: Contiguous axial images were obtained from the base of the skull through the vertex without intravenous contrast. RADIATION DOSE REDUCTION: This exam was performed according to the departmental dose-optimization program which includes automated exposure control, adjustment of the mA and/or kV according to patient size and/or use of iterative reconstruction technique. COMPARISON:  February 26, 2022 FINDINGS: Brain: There is mild cerebral atrophy with widening of the extra-axial spaces and ventricular dilatation. There are areas of decreased attenuation within the white matter tracts of the supratentorial brain, consistent with microvascular disease changes. Vascular: There is mild to moderate severity bilateral cavernous carotid artery calcification. Skull:  Normal. Negative for fracture or focal lesion. Sinuses/Orbits: There is mild sphenoid sinus mucosal thickening. Other: None. IMPRESSION: 1. No acute intracranial abnormality. 2. Generalized cerebral atrophy with chronic white matter small vessel C mic changes. 3. Mild sphenoid sinus disease. Electronically Signed   By: Virgina [place] M.D.   On: 03/12/2022 22:45   DG Chest Portable 1 View  Result Date: 03/12/2022 CLINICAL DATA:  Altered mental status.  Patient is nonverbal. EXAM: PORTABLE CHEST 1 VIEW COMPARISON:  03/03/2022 FINDINGS: A tracheostomy tube is present with tip above the carina. Tracheostomy balloon again appears overinflated. Postoperative changes in the mediastinum with sternotomy wires present. Slightly shallow inspiration with linear infiltration or atelectasis in the lung bases. Mild interstitial pattern to the lung bases may represent early edema. This is progressing since the prior study. No pleural effusions or pneumothorax. IMPRESSION: 1. Tracheostomy tube remains in place with suggestion of over inflated balloon as before. 2. Shallow inspiration with linear atelectasis in the lung bases. 3. Developing interstitial pattern to the lung bases may indicate developing pulmonary edema. Electronically Signed   By: Lucienne Capers M.D.   On: 03/12/2022 20:22     Imaging independently reviewed in Epic.  [facility]ious [facility] [phone] pager 03/14/2022, 1:31 PM  I have personally spent 80 minutes involved in face-to-face and non-face-to-face activities for this patient on the day of the visit. Professional time spent includes the following activities: Preparing to see the patient (review of tests), Obtaining and/or reviewing separately obtained history (admission/discharge record), Performing a medically appropriate examination and/or evaluation , Ordering medications/tests/procedures, referring and communicating with other  health care professionals, Documenting clinical information in the EMR, Independently interpreting results (not separately reported), Communicating results to the patient/family/caregiver, Counseling and educating the patient/family/caregiver and Care coordination (not separately reported).

## 2022-03-15 ENCOUNTER — Inpatient Hospital Stay (HOSPITAL_COMMUNITY): Payer: Medicare Other

## 2022-03-15 DIAGNOSIS — R569 Unspecified convulsions: Secondary | ICD-10-CM | POA: Diagnosis not present

## 2022-03-15 HISTORY — PX: IR REPLACE G-TUBE SIMPLE WO FLUORO: IMG2323

## 2022-03-15 HISTORY — PX: IR GASTROSTOMY TUBE REMOVAL: IMG5492

## 2022-03-15 LAB — BASIC METABOLIC PANEL
Anion gap: 14 (ref 5–15)
Anion gap: 11 (ref 5–15)
BUN: 11 mg/dL (ref 8–23)
BUN: 12 mg/dL (ref 8–23)
CO2: 27 mmol/L (ref 22–32)
CO2: 28 mmol/L (ref 22–32)
Calcium: 8.3 mg/dL — ABNORMAL LOW (ref 8.9–10.3)
Calcium: 8 mg/dL — ABNORMAL LOW (ref 8.9–10.3)
Chloride: 93 mmol/L — ABNORMAL LOW (ref 98–111)
Chloride: 92 mmol/L — ABNORMAL LOW (ref 98–111)
Creatinine, Ser: 0.48 mg/dL — ABNORMAL LOW (ref 0.61–1.24)
Creatinine, Ser: 0.48 mg/dL — ABNORMAL LOW (ref 0.61–1.24)
GFR, Estimated: >60 mL/min (ref >60)
GFR, Estimated: >60 mL/min (ref >60)
Glucose, Bld: 73 mg/dL (ref 70–99)
Glucose, Bld: 72 mg/dL (ref 70–99)
Potassium: 2.4 mmol/L — CL (ref 3.5–5.1)
Potassium: 3.3 mmol/L — ABNORMAL LOW (ref 3.5–5.1)
Sodium: 134 mmol/L — ABNORMAL LOW (ref 135–145)
Sodium: 131 mmol/L — ABNORMAL LOW (ref 135–145)

## 2022-03-15 LAB — GLUCOSE, CAPILLARY
Glucose-Capillary: 92 mg/dL (ref 70–99)
Glucose-Capillary: 121 mg/dL — ABNORMAL HIGH (ref 70–99)

## 2022-03-15 LAB — MAGNESIUM: Magnesium: 1.8 mg/dL (ref 1.7–2.4)

## 2022-03-15 LAB — PHOSPHORUS: Phosphorus: 2.2 mg/dL — ABNORMAL LOW (ref 2.5–4.6)

## 2022-03-15 LAB — LACTIC ACID, PLASMA: Lactic Acid, Venous: 1 mmol/L (ref 0.5–1.9)

## 2022-03-15 MED ORDER — MAGNESIUM SULFATE 2 GM/50ML IV SOLN
2.0000 g | Freq: Once | INTRAVENOUS | Status: AC
  Administered 2022-03-15: 2 g via INTRAVENOUS
  Filled 2022-03-15: qty 50

## 2022-03-15 MED ORDER — SODIUM PHOSPHATES 45 MMOLE/15ML IV SOLN
15.0000 mmol | Freq: Once | INTRAVENOUS | Status: AC
  Administered 2022-03-15: 15 mmol via INTRAVENOUS
  Filled 2022-03-15: qty 5

## 2022-03-15 MED ORDER — DIATRIZOATE MEGLUMINE & SODIUM 66-10 % PO SOLN
ORAL | Status: AC
  Administered 2022-03-15: 30 mL via GASTROSTOMY
  Filled 2022-03-15: qty 30

## 2022-03-15 NOTE — Procedures (Signed)
Patient Name: Austin Davis  MRN: [id number]  Epilepsy Attending: Lora Havens  Referring Physician/Provider: Lorenza Chick, MD  Duration: 03/14/2022 1355 to 03/15/2022 1355   Patient history: 82yo M with h/o history of seizure like episodes in the past. EEG to evaluate for seizure.    Level of alertness:  awake   AEDs during EEG study: None   Technical aspects: This EEG study was done with scalp electrodes positioned according to the 10-20 International system of electrode placement. Electrical activity was reviewed with band pass filter of 1-'70Hz'$ , sensitivity of 7 uV/mm, display speed of 51m/sec with a '60Hz'$  notched filter applied as appropriate. EEG data were recorded continuously and digitally stored.  Video monitoring was available and reviewed as appropriate.   Description: The posterior dominant rhythm consists of 8-9 Hz activity of moderate voltage (25-35 uV) seen predominantly in posterior head regions, symmetric and reactive to eye opening and eye closing. EEG showed intermittent generalized 3 to 6 Hz theta-delta slowing. Hyperventilation and photic stimulation were not performed.     Event button was pressed on 03/15/2022 at 1207. Patient was awake, shaking all over. Concomitant EEG before, during and after the event didn't show any EEG change to suggest seizure.     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 No seizures or epileptiform discharges were seen throughout the recording.  Event button was pressed on 03/15/2022 at 1207. Patient was awake, shaking all over. Concomitant EEG didn't show any EEG change to suggest seizure. This was not an epileptic event.     David Towson [name]

## 2022-03-15 NOTE — Procedures (Signed)
Successful exchange of 24 Fr balloon retention gastrostomy at bedside today. Balloon pulled against the gastric lumen and bumper cinched to skin. If gauze is needed between the tube and skin to help maintain skin integrity please use only 1 piece to ensure balloon does not retract from the gastric lumen causing leakage/possible migration.  Tube is ready for use once KUB with contrast confirms location (ordered).  There is no evidence of feculent material from the insertion site, there is dark blood on the dressing and some fresh blood around the superficial portion of the tract however no site of active bleeding was observed. There was no feculent material in/on the previous tubing upon removal. There is no purulent drainage, erythema or induration suggesting site infection. There is a very small amount of fibrinous exudate superficially which is likely due to inflammation/irritation.  IR remains available as needed, please call with questions or concerns.  Candiss Norse, PA-C

## 2022-03-15 NOTE — Progress Notes (Signed)
Neurology Progress Note  Patient ID: Austin Davis is a 82 y.o. with PMHx of  has a past medical history of CHF (congestive heart failure) ([place]), Coronary artery disease, Essential tremor, GI bleed Apr 18, 2017), Headache, Low blood sugar, Mitral regurgitation, Mitral valve prolapse, Osteopenia 2019/04/19), Paroxysmal atrial fibrillation ([place]), and Tremor.  Subjective: - Alert / communicates but difficult to understand due to mouth tremor  Exam: Current vital signs: BP (!) 166/96 (BP Location: Left Arm)   Pulse 92   Temp 97.6 F (36.4 C) (Oral)   Resp 18   Ht '5\' 10"'$  (1.778 m)   Wt 55.9 kg   SpO2 97%   BMI 17.68 kg/m  Vital signs in last 24 hours: Temp:  [97.6 F (36.4 C)-100.4 F (38 C)] 97.6 F (36.4 C) (02/24 0800) Pulse Rate:  [92-98] 92 (02/24 0220) Resp:  [16-30] 18 (02/24 0800) BP: (109-166)/(70-106) 166/96 (02/24 0800) SpO2:  [92 %-100 %] 97 % (02/24 0800) FiO2 (%):  [45 %] 45 % (02/24 0220) Weight:  [55.9 kg] 55.9 kg (02/24 0500)  Physical Exam  General: Chronically ill-appearing,  HEENT-temporal muscle wasting Cardiovascular- S1-S2 audible, pulses palpable throughout   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 Follows commands with RUE Cranial Nerves: II: Orients to stimuli in all fields  III,IV, VI: Pupils equal round reactive 4 mm to 2 mm, orients to examiner bilaterally, clearly looks more readily to the right than to the left (incomplete burying to the left), saccadic pursuits V,VII: Face symmetric, sensation intact bilateral face VIII: hearing intact to voice Motor: Diffuse atrophy.  Antigravity with the bilateral upper extremities  and gestures with the arms spontaneously. More brisk on the right than the left Sensory: Grossly equally reactive to light touch throughout  Pertinent Labs:  Basic Metabolic Panel: Recent Labs  Lab 03/12/22 04-19-2051 03/12/22 04-18-2098 03/14/22 0604 03/14/22 April 18, 2200 03/15/22 0347  NA 135 133* 132* 134* 131*  K 4.9 5.1 2.8* 2.4* 3.3*  CL 92*  --  93* 93* 92*  CO2 35*  --  '29 27 28  '$ GLUCOSE 152*  --  85 73 72  BUN 27*  --  '14 11 12  '$ CREATININE 0.37*  --  0.38* 0.48* 0.48*  CALCIUM 9.0  --  8.7* 8.3* 8.0*  MG  --   --  1.8 1.9 1.8  PHOS  --   --  2.8  --   --      CBC: Recent Labs  Lab 03/12/22 04-19-51 03/12/22 2098/04/18 03/14/22 0604  WBC 17.6*  --  11.9*  NEUTROABS 16.9*  --   --   HGB 10.7* 11.9* 9.1*  HCT 35.9* 35.0* 28.6*  MCV 88.6  --  83.1  PLT 451*  --  421*     Coagulation Studies: Recent Labs    03/12/22 04/19/51  LABPROT 14.3  INR 1.1     LTM EEG read pending   Assessment:  - Exam reveals patient who seems nearly at his baseline mental status perhaps with some delirium (poor attention/concentration) in the setting of acute infection, baseline mouth and bilateral arm tremors consistent with his baseline diagnosis of essential tremor - Potential PEG site infection as well as potential UTI - EEG  read pending - He has had events prior concerning for possible focal seizure but given his mental status at baseline as well as his baseline tremor, unclear if these are true seizure activities or cognitive fluctuation in the setting of some dementia and poor cognitive reserve as well as frequent encephalopathy secondary to frequent infections / hypoxic events - He is noted to tend to prefer looking to the right by myself and bedside nurse so even less convinced that prior right gaze deviation episodes were truly focal seizures  Impression: Intermittent toxic/metabolic encephalopathy secondary to uremia, infections (previously hypoxia) vs. underlying epilepsy Addition and up titration  of Keppra does not seem to have made a difference in these events.  In fact I query whether it may be contributing to these effects due to the sedating effects of the medication without a clear diagnosis of underlying epilepsy.   Continues on LTM EEG to clarify diagnosis   Recommendations: - Appreciate management of comorbidities per CCM team - Continue to hold keppra for now, may be adjusted pending EEG read - Continue LTM EEG for one more day, if it remains negative tomorrow will likely discontinue  - Neurology will continue to follow  [place] [phone]

## 2022-03-15 NOTE — Consult Note (Signed)
[facility] Consult  Referring Provider: Dr. Leory Plowman Icard/critical care Primary Care Physician:  Sheppard Coil Vicie Mutters, MD Primary Gastroenterologist: Dr. Harrington Challenger was his [place] primary  Reason for Consultation: Ileus versus suspected volvulus  HPI: Austin Davis is a 82 y.o. male is a resident of [facility] who was admitted on 03/12/2022 for possible seizure-like activity. He has multiple medical problems including acute on chronic hypoxic respiratory failure, he is trach and vent dependent, MVP status post mitral valve annuloplasty in 2005 on Coumadin, PAD, CAD, heart failure with EF of 45 to 50% on echo from 8/22, hyperlipidemia, paroxysmal atrial flutter, recent admissions for recurrent sepsis with multiple drug-resistant organisms including ESBL, Pseudomonas, Klebsiella and Enterococcus. I was consulted for suspected volvulus noted on CAT scan. Patient is not able to provide me with any history. Most of the history is obtained from documentation and from the patient's nurse. It seems patient has a feeding tube with dried blood around feeding tube insertion site. He has been having liquid bowel movements. There has been no report of bloody bowel movements or black stools or episodes of hematemesis/coffee-ground emesis.  Previous GI workup: Colonoscopy, Dr. Michail Sermon, inpatient, hematochezia: 12 mm inflammatory polyp removed from ascending, left-sided diverticulosis and internal hemorrhoids noted  Past Medical History:  Diagnosis Date   CHF (congestive heart failure) ([place])    Coronary artery disease    Essential tremor    GI bleed 2019   hospitalized at [facility] for one week   Headache    since childhood   Low blood sugar    since childhood, controlled by diet   Mitral regurgitation    Mitral valve prolapse    Osteopenia 2021   Paroxysmal atrial fibrillation (HCC)    Tremor     Past Surgical History:  Procedure Laterality Date   COLONOSCOPY WITH PROPOFOL  N/A 02/19/2017   Procedure: COLONOSCOPY WITH PROPOFOL;  Surgeon: Wilford Corner, MD;  Location: [facility];  Service: Endoscopy;  Laterality: N/A;   IR GASTROSTOMY TUBE MOD SED  10/18/2020   IR IVC FILTER PLMT / S&I /IMG GUID/MOD SED  11/01/2020   laser eye surgery for retina detachment     MITRAL VALVE REPAIR  01/2003   monitor  02/05/2006   polyp removal     TONSILLECTOMY     tooth removal     as a teenager   TRACHEOSTOMY TUBE PLACEMENT N/A 10/26/2020   Procedure: TRACHEOSTOMY;  Surgeon: Rozetta Nunnery, MD;  Location: [facility];  Service: ENT;  Laterality: N/A;    Prior to Admission medications   Medication Sig Start Date End Date Taking? Authorizing Provider  acetaminophen (TYLENOL) 325 MG tablet Place 650 mg into feeding tube every 8 (eight) hours as needed for mild pain or fever.   Yes [provider]  ALPRAZolam Duanne Moron) 0.5 MG tablet Place 0.5 mg into feeding tube every 8 (eight) hours as needed for anxiety.   Yes [provider]  esomeprazole (NEXIUM) 40 MG capsule 40 mg daily. Via feeding tube   Yes [provider]  fluticasone (FLONASE) 50 MCG/ACT nasal spray Place 1 spray into both nostrils every 6 (six) hours as needed for allergies or rhinitis.   Yes [provider]  folic acid (FOLVITE) 1 MG tablet Take 1 tablet (1 mg total) by mouth daily. Patient taking differently: Place 1 mg into feeding tube daily. 09/29/20  Yes [name], MD  furosemide (LASIX) 20 MG tablet Place 1 tablet (20 mg total) into feeding tube  daily as needed for fluid or edema. 07/03/21  Yes Mercy Riding, MD  hydrOXYzine (ATARAX) 25 MG tablet Place 25 mg into feeding tube every 6 (six) hours as needed for anxiety. 11/01/21  Yes [provider]  hydrOXYzine (ATARAX) 50 MG tablet Place 50 mg into feeding tube every 6 (six) hours.   Yes [provider]  Lactobacillus Rhamnosus, GG, (CULTURELLE PO) Place 1 capsule into feeding tube every morning.    Yes [provider]  lactulose (CHRONULAC) 10 GM/15ML solution Place 20 g into feeding tube every 12 (twelve) hours as needed (constipation).   Yes [provider]  levalbuterol (XOPENEX) 1.25 MG/3ML nebulizer solution Take 1.25 mg by nebulization every 6 (six) hours.   Yes [provider]  levETIRAcetam (KEPPRA) 100 MG/ML solution Place 1,000 mg into feeding tube 2 (two) times daily.   Yes [provider]  levothyroxine (SYNTHROID) 25 MCG tablet Place 25 mcg into feeding tube every morning.   Yes [provider]  Melatonin 3 MG TBDP Place 6 mg into feeding tube at bedtime.   Yes [provider]  metoprolol tartrate (LOPRESSOR) 25 MG tablet Place 25 mg into feeding tube as needed (AFIB. Do not administer if low BP, systolic [id number]).   Yes [provider]  midodrine (PROAMATINE) 10 MG tablet Place 1 tablet (10 mg total) into feeding tube 3 (three) times daily with meals. Patient taking differently: Place 10 mg into feeding tube 3 (three) times daily. 05/01/21  Yes Thurnell Lose, MD  Multiple Vitamins-Iron (MULTIVITAMINS WITH IRON) TABS tablet Take 1 tablet per tube daily   Yes [provider]  Nutritional Supplements (FEEDING SUPPLEMENT, OSMOLITE 1.5 CAL,) LIQD Place 60 mL/hr into feeding tube 2 (two) times daily.   Yes [provider]  Omega-3 Fatty Acids (FISH OIL) 1000 MG CAPS Place 1,000 mg into feeding tube every morning.   Yes [provider]  polyethylene glycol (MIRALAX / GLYCOLAX) 17 g packet Place 17 g into feeding tube every other day.   Yes [provider]  propranolol (INDERAL) 10 MG tablet Place 10 mg into feeding tube daily as needed (Tremor. Hold if HR is less than 60 or SBP less than 90).   Yes [provider]  sertraline (ZOLOFT) 50 MG tablet Place 50 mg into feeding tube daily.   Yes [provider]  tamsulosin (FLOMAX) 0.4 MG CAPS capsule 0.4 mg daily. Via feeding  tube   Yes [provider]  valproic acid (DEPAKENE) 250 MG/5ML solution Place 250 mg into feeding tube 2 (two) times daily.   Yes [provider]  Water For Irrigation, Sterile (STERILE WATER FOR IRRIGATION) Irrigate with 100 mLs as directed See admin instructions. 100 ml H2O flush every 4 hours    [provider]    Current Facility-Administered Medications  Medication Dose Route Frequency Provider Last Rate Last Admin   0.9 %  sodium chloride infusion  250 mL Intravenous Continuous Regan Lemming, MD       acetaminophen (TYLENOL) 160 MG/5ML solution 650 mg  650 mg Per Tube Q6H PRN [place] Crane M, PA-C   650 mg at 03/15/22 0025   Chlorhexidine Gluconate Cloth 2 % PADS 6 each  6 each Topical Daily Frederik Pear, MD   6 each at 03/15/22 0600   docusate (COLACE) 50 MG/5ML liquid 100 mg  100 mg Per Tube BID PRN Gleason, Otilio Carpen, PA-C       fentaNYL (SUBLIMAZE) injection 12.5-25  mcg  12.5-25 mcg Intravenous Q4H PRN Lestine Mount, PA-C   25 mcg at 03/15/22 [id number]   heparin injection 5,000 Units  5,000 Units Subcutaneous Q8H Gleason, Otilio Carpen, PA-C   5,000 Units at 03/15/22 [id number]   linezolid (ZYVOX) IVPB 600 mg  600 mg Intravenous Q12H Regan Lemming, MD 300 mL/hr at 03/15/22 1103 600 mg at 03/15/22 1103   meropenem (MERREM) 1 g in sodium chloride 0.9 % 100 mL IVPB  1 g Intravenous Q8H Mignon Pine, DO   Stopped at 03/15/22 0730   micafungin (MYCAMINE) 100 mg in sodium chloride 0.9 % 100 mL IVPB  100 mg Intravenous Q24H [place] Crane M, PA-C   Paused at 03/15/22 1022   midazolam (VERSED) injection 1 mg  1 mg Intravenous Q4H PRN Bhagat, Srishti L, MD       norepinephrine (LEVOPHED) '4mg'$  in 25m (0.016 mg/mL) premix infusion  2-10 mcg/min Intravenous Titrated LRegan Lemming MD       Oral care mouth rinse  15 mL Mouth Rinse Q2H Ogan, Okoronkwo U, MD   15 mL at 03/15/22 1103   pantoprazole (PROTONIX) injection 40 mg  40 mg Intravenous Q12H RNevada CraneM,  PA-C   40 mg at 03/15/22 [id number]  polyethylene glycol (MIRALAX / GLYCOLAX) packet 17 g  17 g Per Tube Daily PRN Gleason, LOtilio Carpen PA-C       potassium chloride (KLOR-CON) packet 40 mEq  40 mEq Per Tube Once RLestine Mount PA-C        Allergies as of 03/12/2022 - Review Complete 03/12/2022  Allergen Reaction Noted   Prednisone Other (See Comments) 05/03/2013   Cortisone Other (See Comments) 11/16/2019    Family History  Problem Relation Age of Onset   Heart disease Mother    CAD Mother    Migraines Mother    Heart failure Father    Skin cancer Father    Valvular heart disease Father        prolapsed valve   Tremor Father        "probably had a bit of tremor in his old age"   Heart failure Maternal Grandmother    Pneumonia Maternal Grandfather    Heart failure Paternal Grandmother    Stroke Paternal Grandfather    Asthma Daughter    Epilepsy Son    Parkinson's disease Neg Hx     Social History   Socioeconomic History   Marital status: Single    Spouse name: Not on file   Number of children: 2   Years of education: 16   Highest education level: Not on file  Occupational History   Occupation: [employer]  Tobacco Use   Smoking status: Never   Smokeless tobacco: Never   Tobacco comments:    only tried a few cigarettes when he was younger  Substance and Sexual Activity   Alcohol use: Yes    Comment: "probably a 6 pack of beer a year"   Drug use: No   Sexual activity: Not on file  Other Topics Concern   Not on file  Social History Narrative   Lives with girlfriend   Caffeine use: rarely    Right handed   Social Determinants of Health   Financial Resource Strain: Not on file  Food Insecurity: Not on file  Transportation Needs: Not on file  Physical Activity: Not on file  Stress: Not on file  Social Connections: Not on file  Intimate Partner Violence: Not on file  Review of Systems: As per HPI  Physical Exam: Vital signs in last 24  hours: Temp:  [97.6 F (36.4 C)-100.4 F (38 C)] 97.6 F (36.4 C) (02/24 0800) Pulse Rate:  [92-98] 92 (02/24 0220) Resp:  [16-30] 20 (02/24 1100) BP: (109-166)/(70-101) 126/87 (02/24 1100) SpO2:  [95 %-100 %] 100 % (02/24 1100) FiO2 (%):  [45 %] 45 % (02/24 0740) Weight:  [55.9 kg] 55.9 kg (02/24 0500) Last BM Date : 03/15/22  General:   Awake, moves extremities, nonverbal Head: Status post trach and PEG in place, vent dependent Eyes:  Sclera clear, no icterus.  Prominent pallor  Ears:  Normal auditory acuity. Nose:  No deformity, discharge,  or lesions. Mouth:  No deformity or lesions.  Oropharynx pink & moist. Neck:  Supple; no masses or thyromegaly. Lungs:  Clear throughout to auscultation.   No wheezes, crackles, or rhonchi. No acute distress. Heart:  Regular rate and rhythm; no murmurs, clicks, rubs,  or gallops. Extremities:  Without clubbing or edema. Neurologic: Able to move both upper extremities Skin:  Intact without significant lesions or rashes. Psych:  Alert and cooperative. Normal mood and affect. Abdomen: PEG tube in place , dried blood noted around PEG tube insertion site , no obvious discharge or abscess noted, slightly distended abdomen but not tense, bowel sounds are very sluggish but when heard or high-pitched, nontender         Lab Results: Recent Labs    03/12/22 2053 03/12/22 2100 03/14/22 0604  WBC 17.6*  --  11.9*  HGB 10.7* 11.9* 9.1*  HCT 35.9* 35.0* 28.6*  PLT 451*  --  421*   BMET Recent Labs    03/14/22 0604 03/14/22 2202 03/15/22 0347  NA 132* 134* 131*  K 2.8* 2.4* 3.3*  CL 93* 93* 92*  CO2 '29 27 28  '$ GLUCOSE 85 73 72  BUN '14 11 12  '$ CREATININE 0.38* 0.48* 0.48*  CALCIUM 8.7* 8.3* 8.0*   LFT Recent Labs    03/14/22 0604  PROT 6.9  ALBUMIN 2.0*  AST 26  ALT 42  ALKPHOS 96  BILITOT 0.8   PT/INR Recent Labs    03/12/22 2053  LABPROT 14.3  INR 1.1    Studies/Results: DG Abd 1 View  Result Date:  03/15/2022 CLINICAL DATA:  Abdominal distension EXAM: ABDOMEN - 1 VIEW COMPARISON:  Plain film of the abdomen dated 03/03/2022. CT abdomen and pelvis dated 03/13/2022. FINDINGS: At least mild gaseous distention of the entire large bowel, particularly prominent at the level of the cecum. Gas is seen at the rectal vault. Questionable mildly prominent gas-filled loops of small bowel within the central abdomen. No evidence of soft tissue mass or abnormal fluid collection. No evidence of free intraperitoneal air is seen. 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 IVC filter in place. IMPRESSION: At least mild gaseous distention of the entire large bowel, particularly prominent at the level of the cecum. Gas is seen at the rectal vault. Findings could represent a generalized ileus, less likely colonic pseudoobstruction. Electronically Signed   By: Franki Cabot M.D.   On: 03/15/2022 08:52   Overnight EEG with video  Result Date: 03/14/2022 Lora Havens, MD     03/15/2022  9:42 AM Patient Name: Austin Davis MRN: [id number] Epilepsy Attending: Lora Havens Referring Physician/Provider: Lorenza Chick, MD Duration: 03/13/2022 1355 to 03/14/2022 1355  Patient history: 82yo M with h/o history of seizure like episodes  in the past. EEG to evaluate for seizure.  Level of alertness:  awake  AEDs during EEG study: LEV  Technical aspects: This EEG study was done with scalp electrodes positioned according to the 10-20 International system of electrode placement. Electrical activity was reviewed with band pass filter of 1-'70Hz'$ , sensitivity of 7 uV/mm, display speed of 33m/sec with a '60Hz'$  notched filter applied as appropriate. EEG data were recorded continuously and digitally stored.  Video monitoring was available and reviewed as appropriate.  Description: The posterior dominant rhythm consists of 8-9 Hz activity of moderate voltage (25-35 uV)  seen predominantly in posterior head regions, symmetric and reactive to eye opening and eye closing. EEG showed intermittent generalized 3 to 6 Hz theta-delta slowing. Hyperventilation and photic stimulation were not performed.   Patient was noted to have near continuous lower jaw tremor when patient was awake. Concomitant EEG before, during and after the event did not show any EEG changes suggest seizure.  ABNORMALITY - Intermittent slow, generalized  IMPRESSION: This study is suggestive of mild to moderate diffuse encephalopathy, nonspecific etiology. No seizures or epileptiform discharges were seen throughout the recording. Patient was noted to have near continuous lower jaw tremor when patient was awake without concomitant EEG change. 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  PLora Havens DG CHEST PORT 1 VIEW  Result Date: 03/14/2022 CLINICAL DATA:  Shortness of breath. EXAM: PORTABLE CHEST 1 VIEW COMPARISON:  03/12/2022 and 03/03/2022 FINDINGS: Single-view of the chest demonstrates a tracheostomy tube. Median sternotomy wires. Prominent interstitial lung markings are suggestive for chronic changes and minimal change since 03/12/2022. However, the basilar densities have increased since 03/03/2022. Negative for pneumothorax. Heart size is within normal limits and stable. IVC filter is partially visualized. IMPRESSION: Chronic lung changes with increased densities at the lung bases. Findings could represent acute on chronic disease but nonspecific. Minimal change from the recent comparison examination. Electronically Signed   By: AMarkus DaftM.D.   On: 03/14/2022 09:23   CT ABDOMEN PELVIS W CONTRAST  Result Date: 03/13/2022 CLINICAL DATA:  Abdominal pain, sepsis EXAM: CT ABDOMEN AND PELVIS WITH CONTRAST TECHNIQUE: Multidetector CT imaging of the abdomen and pelvis was performed using the standard protocol following bolus administration of intravenous contrast. RADIATION DOSE REDUCTION: This exam was performed  according to the departmental dose-optimization program which includes automated exposure control, adjustment of the mA and/or kV according to patient size and/or use of iterative reconstruction technique. CONTRAST:  758mOMNIPAQUE IOHEXOL 350 MG/ML SOLN COMPARISON:  CT 10/06/2020, 09/22/2020, chest x-ray 03/12/2022 FINDINGS: Lower chest: Patchy bibasilar airspace opacities. Diffuse bronchial wall thickening. No pleural effusion. Heart size within normal limits. Hepatobiliary: There are a few subcentimeter low-density lesions within the liver, too small to characterize. Probable site of fatty infiltration along the falciform ligament. Otherwise, no focal liver abnormality. Unremarkable gallbladder. No hyperdense gallstone. No biliary dilatation. Pancreas: Unremarkable. No pancreatic ductal dilatation or surrounding inflammatory changes. Spleen: Normal in size without focal abnormality. Adrenals/Urinary Tract: Unremarkable adrenal glands. Multiple punctate nonobstructing renal stones, more numerous on the left. No hydronephrosis. Multiple stones within the dependent portion of the urinary bladder. Trabeculated appearance of the bladder wall. Stomach/Bowel: There is a percutaneous gastrostomy tube with balloon inflated within the second portion of the duodenum. Stomach is largely decompressed. Oral contrast is present within the small bowel and colon. No dilated loops of small bowel. The colon is moderately distended with the cecum measuring up to 9 cm in diameter. Air-fluid levels within the colon. There partial swirling  of the mesentery (series 3, images 52-56) without abrupt colonic caliber change. Enteric contrast traverses the area of swirling. No findings of an acute sigmoid volvulus. This rectum is distended with stool. Mild rectal wall thickening. Vascular/Lymphatic: Aortic atherosclerosis. IVC filter in place. No enlarged abdominal or pelvic lymph nodes. Reproductive: Prostate gland is mildly enlarged. Other:  No free fluid. No abdominopelvic fluid collection. No pneumoperitoneum. No abdominal wall hernia. Musculoskeletal: Dextro scoliotic thoracolumbar curvature. Numerous vertebral body compression fractures including moderate superior endplate compression deformity of T11 and moderate inferior endplate deformity at 624THL, both are new from prior and could be subacute. The additional compression deformities appear to be chronic. The bones are demineralized. IMPRESSION: 1. The colon is moderately distended with multiple air-fluid levels. There is partial swirling of the mesentery in the left lower quadrant without abrupt colonic caliber change. Enteric contrast traverses the area of swirling. A transient or incomplete sigmoid volvulus is not entirely excluded, however the findings may simply represent a colonic ileus. Continued radiographic follow-up is suggested. 2. Patchy bibasilar airspace opacities, likely pneumonia or aspiration. 3. Percutaneous gastrostomy tube with balloon inflated within the second portion of the duodenum. Consider repositioning. 4. Numerous vertebral body compression fractures including moderate superior endplate compression deformity of T11 and moderate inferior endplate deformity at 624THL, both are new from prior and could be subacute. The additional compression deformities appear to be chronic. 5. Multiple punctate nonobstructing renal stones, more numerous on the left. Multiple stones within the dependent portion of the urinary bladder. No hydronephrosis. 6. Trabeculated appearance of the bladder wall suggesting chronic bladder outlet obstruction. 7. Aortic atherosclerosis (ICD10-I70.0). Electronically Signed   By: Davina Poke D.O.   On: 03/13/2022 13:21    Impression: Moderately distended colon with multiple air-fluid levels Partial swirling of mesentery and left lower quadrant without abrupt colonic caliber change Possible transient or incomplete sigmoid volvulus cannot be  excluded  Abdominal x-ray showed mild gaseous distention of entire large bowel, particularly prominent at level of cecum with gas seen in rectal vault, likely representing generalized ileus and less likely colonic pseudoobstruction  Multiple comorbidities-seizure, history of Parkinson's, sepsis, recent pneumonia and UTI, hypothyroidism  Plan: As patient continues to have liquid bowel movements and his abdomen is not tense although slightly distended, the clinical picture compatible with generalized ileus versus colonic pseudo-obstruction. Recommend monitoring with abdominal x-ray every 24 hours. Patient is on fentanyl as needed which can worsen ileus and recommend avoiding narcotics if possible. Okay to use PEG tube for MiraLAX 1-3 times a day as needed. Currently there are no indications for sigmoidoscopy/colonoscopy however, continued monitoring with imaging is recommended. Discussed the same with Dr. Leory Plowman Icard from ICU.   LOS: 3 days   Ronnette Juniper, MD  03/15/2022, 11:14 AM

## 2022-03-15 NOTE — Progress Notes (Signed)
NAME:  Austin Davis, MRN:  [id number], DOB:  11-05-1940, LOS: 3 ADMISSION DATE:  03/12/2022, CONSULTATION DATE:  03/12/2022 REFERRING MD:  Armandina Gemma - EDP, CHIEF COMPLAINT:  Seizure activity   History of Present Illness:  82 year old man residing at [place] who presented to [facility] 2/21 for possible seizure activity. 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 Multiple recent admissions to [facility] with recurrent sepsis, infection with multidrug-resistant organisms (ESBL, Pseudomonas, Klebsiella, Enterococcus)  Patient was recently admitted from 2/7 - 2/12 with encephalopathy, concern for seizures and PsA VAP with ESBL E. Coli UTI.  Treated Zyvox and meropenem and discharged back to [facility].  EEG was negative and he was at his baseline mental status at the time of discharge, awake and following commands.   On 2/21, patient reportedly had seizure activity while at [facility]; no clear history available but presented to the ED with AMS. CT Head was negative, he was hemodynamically stable with labs significant for LA 1.7, ammonia 107, WBC 17.6, glucose 152, EEG completed but not read yet. Loaded with Keppra and treated empiric meningitis with acyclovir, meropenem, and vancomycin.  PCCM consulted for admission.  Pertinent Medical History:   Past Medical History:  Diagnosis Date   CHF (congestive heart failure) ([place])    Coronary artery disease    Essential tremor    GI bleed 2019   hospitalized at [facility] for one week   Headache    since childhood   Low blood sugar    since childhood, controlled by diet   Mitral regurgitation    Mitral valve prolapse    Osteopenia 2021   Paroxysmal atrial fibrillation ([place])    Tremor    Significant Hospital Events: Including procedures, antibiotic start and stop dates in addition to other  pertinent events   2/21 Admit to PCCM. 2/22 CT Head NAICA, generalized atrophy. STAT EEG without seizure activity. Persistent Parkinsonian tremoring of BUE. Urinary retention with UA c/f persistent infection. PEG site erythematous, indurated and with pus present. CT A/P demonstrating air-fluid levels in distended colon, ?ileus vs. Volvulus, bibasilar airspace opacities, PEG in second portion of duodenum, numerous thoracic compression fx, multiple nonobstructing renal stones, bladder outlet obstruction (chronic). Keppra stopped, LTM EEG to r/o seizure. Mero -> Zosyn in the setting of seizure risk. 2/23 Low grade fever, tachy with ?Afib RVR to 140s; resolved with metop '5mg'$  x 1. PEG with dark brown ?feculent discharge. IR consulted for eval/exchange. Mica added for fungal coverage; ID consulted. LTM EEG ongoing. 2/24 asked gensx and GI to eval possible underlying sig vol.   Interim History / Subjective:   Remains critically ill. Remains on vent. Continuous LTVEEG  Objective:  Blood pressure (!) 147/99, pulse 92, temperature 99.1 F (37.3 C), temperature source Axillary, resp. rate 20, height '5\' 10"'$  (1.778 m), weight 55.9 kg, SpO2 96 %.    Vent Mode: PCV FiO2 (%):  [45 %] 45 % Set Rate:  [20 bmp] 20 bmp PEEP:  [5 cmH20] 5 cmH20 Plateau Pressure:  [27 cmH20-30 cmH20] 27 cmH20   Intake/Output Summary (Last 24 hours) at 03/15/2022 0748 Last data filed at 03/15/2022 0600 Gross per 24 hour  Intake 2258.79 ml  Output 2720 ml  Net -461.21 ml   Filed Weights   03/13/22 1100 03/14/22 0500 03/15/22 0500  Weight: 52.4 kg 58.1 kg 55.9 kg   Physical Examination: General:  critically ill, chronically ill, trach dependent . HEENT: NCAT, EEG leads in place, trach in place  Neck: thin yellow secretions from trach  Neuro: alert, follows no commands, moves extremities spontaneously, trying to track and follow voice in room  CV: irregular, normal rate  PULM: BL vented breaths  GI: drainage from around peg  site  Extremities: muscle wasting, no edema   Resolved Hospital Problem List:    Assessment & Plan:   Acute Encephalopathy Seizure activity, on LTVEEG History of Parkinson's disease Elevated Ammonia CT Head 2/21 [place]. - multifactorial problem  P: On LTVEEG  Holding additional aeds per neuro  On abx with concern for peg drainage   Sepsis, unknown source, multifactorial, present on admission  History of multiple admissions for recurrent ESBL infections Recent PsA HCAP and ESBL E. Coli UTI Chronic bladder outlet obstruction P: Wean pressors to maintain map >65, off NEPI at this time  Foley in place  Follow up cx  Appreciate ID input  Continue meropenem, zyvox, micafungin   Abdominal pain ?Ileus vs. volvulus Concern for PEG tube site infection History of GIB CT A/P demonstrating air-fluid levels in distended colon, ?ileus vs. Volvulus, bibasilar airspace opacities, PEG in second portion of duodenum, numerous thoracic compression fx, multiple nonobstructing renal stones, bladder outlet obstruction (chronic). P: Case discussed with Gensx  Will discussed with GI as well -- I spoke with Dr. Acie Fredrickson GI  Possibly needs untwisted  STAT KUB this AM  Check lactate  Chronic hypoxic respiratory failure Tracheostomy dependent HCAP P: Remains on full vent support  VAP ppx  Trach care  Wean peep and fio2 as tolerated   Hx atrial fibrillation Sinus on admission, not anti-coagulated secondary to hx of GIB. P: Remains on tele  Now in NSR  Not on AC  Hypothyroidism - Continue levothyroxine   Best Practice: (right click and "Reselect all SmartList Selections" daily)   Diet/type: NPO DVT prophylaxis: prophylactic heparin  GI prophylaxis: PPI Lines: N/A Foley:  N/A Code Status:  full code Last date of multidisciplinary goals of care discussion [pending]  This patient is critically ill with multiple organ system failure; which, requires frequent high complexity  decision making, assessment, support, evaluation, and titration of therapies. This was completed through the application of advanced monitoring technologies and extensive interpretation of multiple databases. During this encounter critical care time was devoted to patient care services described in this note for 33 minutes.  Garner Nash, DO [facility] 03/15/2022 7:48 AM

## 2022-03-15 NOTE — Progress Notes (Signed)
Pharmacy Antibiotic Note  Austin Davis is a 82 y.o. male for which pharmacy has been consulted for meropenem dosing for sepsis.  Patient with a history of recurrent sepsis, infection with multidrug-resistant organisms, ESBL, Pseudomonas, Klebsiella, Enterococcus, Parkinson's, seizures, HF, CAD, GIB, AF, Tremor. Patient presented from [facility] with seizures on 03/13/22.  SCr 0.48, stable WBC trending down to 11.9 yesterday LA trending down to 1 Tmax 100.2  Plan: Continue Meropenem 1g q8h Continue Linezolid '600mg'$  IV q12h per MD Continue Micafungin '100mg'$  IV q24h per MD Monitor daily CBC, temp, SCr, and for clinical signs of improvement  F/u urine, respiratory, and blood cultures and de-escalate antibiotics as able  F/u ID recommendations for antimicrobial plan  Height: '5\' 10"'$  (177.8 cm) Weight: 55.9 kg (123 lb 3.8 oz) IBW/kg (Calculated) : 73  Temp (24hrs), Avg:99 F (37.2 C), Min:97.6 F (36.4 C), Max:100.4 F (38 C)  Recent Labs  Lab 03/12/22 2053 03/14/22 0604 03/14/22 2202 03/15/22 0347 03/15/22 0944  WBC 17.6* 11.9*  --   --   --   CREATININE 0.37* 0.38* 0.48* 0.48*  --   LATICACIDVEN 1.7  --   --   --  1.0    Estimated Creatinine Clearance: 57.3 mL/min (A) (by C-G formula based on SCr of 0.48 mg/dL (L)).    Allergies  Allergen Reactions   Prednisone Other (See Comments)    Listed as allergy on MAR Unknown reaction   Cortisone Other (See Comments)    Listed as allergy per MAR Unknown reaction   Antimicrobials during admission: Acyclovir 2/21 >> 2/22 Vancomycin 2/21 x1 Meropenem 2/21 >> 2/22, 2/23 >>  Zosyn 2/22 >> 2/23 Linezolid 2/22 >>  Micafungin 2/23 >>  Microbiology results: 2/21 Bld cx: NGTD x3d 2/22 Bld cx: NGTD x2d 2/22 Ur cx (catheter): 60K GNRs - reincubated 2/22 Ur cx: 4K GNR - reincubated 2/23 Tracheal aspirate: rare GNRs   Thank you for allowing pharmacy to be a part of this patient's care.  Luisa Hart, PharmD,  BCPS Clinical Pharmacist 03/15/2022 1:21 PM   Please refer to [facility] for pharmacy phone number

## 2022-03-15 NOTE — Progress Notes (Signed)
EEG maint complete.  

## 2022-03-15 NOTE — Consult Note (Signed)
Consulting Physician: Nickola Major Shonya Sumida  Referring Provider: Dr. Valeta Harms - CCM  Chief Complaint: Seizure activity  Reason for Consult: Possible sigmoid volvulus   Subjective   HPI: Austin Davis is an 82 y.o. male who is here for seizure activity.  He resides at [place] and is trach and vent dependent.  He has multiple recent admissions for infections.  He is in the ICU on neuromonitoring for seizure activity.  CT and x-ray imaging were completed showing a dilated colon.  There was some concern voiced by the radiologist for a possible partial sigmoid volvulus.  Gastroenterology and general surgery were consulted for evaluation.  The patient continues to have bowel movements.  Past Medical History:  Diagnosis Date   CHF (congestive heart failure) (H[facility])    Coronary artery disease    Essential tremor    GI bleed 2019   hospitalized at [facility] for one week   Headache    since childhood   Low blood sugar    since childhood, controlled by diet   Mitral regurgitation    Mitral valve prolapse    Osteopenia 2021   Paroxysmal atrial fibrillation ([facility])    Tremor     Past Surgical History:  Procedure Laterality Date   COLONOSCOPY WITH PROPOFOL N/A 02/19/2017   Procedure: COLONOSCOPY WITH PROPOFOL;  Surgeon: Wilford Corner, MD;  Location: [facility];  Service: Endoscopy;  Laterality: N/A;   IR GASTROSTOMY TUBE MOD SED  10/18/2020   IR GASTROSTOMY TUBE REMOVAL  03/15/2022   IR IVC FILTER PLMT / S&I /IMG GUID/MOD SED  11/01/2020   laser eye surgery for retina detachment     MITRAL VALVE REPAIR  01/2003   monitor  02/05/2006   polyp removal     TONSILLECTOMY     tooth removal     as a teenager   TRACHEOSTOMY TUBE PLACEMENT N/A 10/26/2020   Procedure: TRACHEOSTOMY;  Surgeon: Rozetta Nunnery, MD;  Location: [place];  Service: ENT;  Laterality: N/A;    Family History  Problem Relation Age of Onset   Heart disease Mother    CAD Mother    Migraines Mother    Heart  failure Father    Skin cancer Father    Valvular heart disease Father        prolapsed valve   Tremor Father        "probably had a bit of tremor in his old age"   Heart failure Maternal Grandmother    Pneumonia Maternal Grandfather    Heart failure Paternal Grandmother    Stroke Paternal Grandfather    Asthma Daughter    Epilepsy Son    Parkinson's disease Neg Hx     Social:  reports that he has never smoked. He has never used smokeless tobacco. He reports current alcohol use. He reports that he does not use drugs.  Allergies:  Allergies  Allergen Reactions   Prednisone Other (See Comments)    Listed as allergy on MAR Unknown reaction   Cortisone Other (See Comments)    Listed as allergy per MAR Unknown reaction    Medications: Current Outpatient Medications  Medication Instructions   acetaminophen (TYLENOL) 650 mg, Per Tube, Every 8 hours PRN   ALPRAZolam (XANAX) 0.5 mg, Per Tube, Every 8 hours PRN   esomeprazole (NEXIUM) 40 mg, Daily, Via feeding tube   feeding supplement (OSMOLITE 1.5 CAL) 60 mL/hr, Per Tube, 2 times daily   Fish Oil 1,000 mg, Per Tube, Every morning  fluticasone (FLONASE) 50 MCG/ACT nasal spray 1 spray, Each Nare, Every 6 hours PRN   folic acid (FOLVITE) 1 mg, Oral, Daily   furosemide (LASIX) 20 mg, Per Tube, Daily PRN   hydrOXYzine (ATARAX) 50 mg, Per Tube, Every 6 hours   hydrOXYzine (ATARAX) 25 mg, Per Tube, Every 6 hours PRN   Lactobacillus Rhamnosus, GG, (CULTURELLE PO) 1 capsule, Per Tube, Every morning   lactulose (CHRONULAC) 20 g, Per Tube, Every 12 hours PRN   levalbuterol (XOPENEX) 1.25 mg, Nebulization, Every 6 hours   levETIRAcetam (KEPPRA) 1,000 mg, Per Tube, 2 times daily   levothyroxine (SYNTHROID) 25 mcg, Per Tube, Every morning   Melatonin 6 mg, Per Tube, Daily at bedtime   metoprolol tartrate (LOPRESSOR) 25 mg, Per Tube, As needed   midodrine (PROAMATINE) 10 mg, Per Tube, 3 times daily with meals   Multiple Vitamins-Iron  (MULTIVITAMINS WITH IRON) TABS tablet Take 1 tablet per tube daily   polyethylene glycol (MIRALAX / GLYCOLAX) 17 g, Per Tube, Every other day   propranolol (INDERAL) 10 mg, Per Tube, Daily PRN   sertraline (ZOLOFT) 50 mg, Per Tube, Daily   tamsulosin (FLOMAX) 0.4 mg, Daily, Via feeding tube   valproic acid (DEPAKENE) 250 mg, Per Tube, 2 times daily   Water For Irrigation, Sterile (STERILE WATER FOR IRRIGATION) 100 mLs, Irrigation, See admin instructions, 100 ml H2O flush every 4 hours    ROS - all of the below systems have been reviewed with the patient and positives are indicated with bold text General: chills, fever or night sweats Eyes: blurry vision or double vision ENT: epistaxis or sore throat Allergy/Immunology: itchy/watery eyes or nasal congestion Hematologic/Lymphatic: bleeding problems, blood clots or swollen lymph nodes Endocrine: temperature intolerance or unexpected weight changes Breast: new or changing breast lumps or nipple discharge Resp: cough, shortness of breath, or wheezing CV: chest pain or dyspnea on exertion GI: as per HPI GU: dysuria, trouble voiding, or hematuria MSK: joint pain or joint stiffness Neuro: TIA or stroke symptoms Derm: pruritus and skin lesion changes Psych: anxiety and depression  Objective   PE Blood pressure 139/84, pulse 92, temperature 98.6 F (37 C), temperature source Oral, resp. rate (!) 22, height '5\' 10"'$  (1.778 m), weight 55.9 kg, SpO2 100 %. Constitutional: Trach in place, connected to neuromonitoring Eyes: Moist conjunctiva; no lid lag; anicteric; PERRL Neck: Tracheostomy in place; no thyromegaly Lungs: Ventilated; no tactile fremitus CV: RRR; no palpable thrills; no pitting edema GI: Abd G-tube in place, abdomen soft, nontender, nondistended; no palpable hepatosplenomegaly MSK: Normal range of motion of extremities; no clubbing/cyanosis Psychiatric: Appropriate affect; alert and oriented x3 Lymphatic: No palpable cervical or  axillary lymphadenopathy  Results for orders placed or performed during the hospital encounter of 03/12/22 (from the past 24 hour(s))  Basic metabolic panel     Status: Abnormal   Collection Time: 03/14/22 10:02 PM  Result Value Ref Range   Sodium 134 (L) 135 - 145 mmol/L   Potassium 2.4 (LL) 3.5 - 5.1 mmol/L   Chloride 93 (L) 98 - 111 mmol/L   CO2 27 22 - 32 mmol/L   Glucose, Bld 73 70 - 99 mg/dL   BUN 11 8 - 23 mg/dL   Creatinine, Ser 0.48 (L) 0.61 - 1.24 mg/dL   Calcium 8.3 (L) 8.9 - 10.3 mg/dL   GFR, Estimated >60 >60 mL/min   Anion gap 14 5 - 15  Magnesium     Status: None   Collection Time: 03/14/22 10:02 PM  Result Value Ref Range   Magnesium 1.9 1.7 - 2.4 mg/dL  Phosphorus     Status: Abnormal   Collection Time: 03/15/22  3:43 AM  Result Value Ref Range   Phosphorus 2.2 (L) 2.5 - 4.6 mg/dL  Basic metabolic panel     Status: Abnormal   Collection Time: 03/15/22  3:47 AM  Result Value Ref Range   Sodium 131 (L) 135 - 145 mmol/L   Potassium 3.3 (L) 3.5 - 5.1 mmol/L   Chloride 92 (L) 98 - 111 mmol/L   CO2 28 22 - 32 mmol/L   Glucose, Bld 72 70 - 99 mg/dL   BUN 12 8 - 23 mg/dL   Creatinine, Ser 0.48 (L) 0.61 - 1.24 mg/dL   Calcium 8.0 (L) 8.9 - 10.3 mg/dL   GFR, Estimated >60 >60 mL/min   Anion gap 11 5 - 15  Magnesium     Status: None   Collection Time: 03/15/22  3:47 AM  Result Value Ref Range   Magnesium 1.8 1.7 - 2.4 mg/dL  Lactic acid, plasma     Status: None   Collection Time: 03/15/22  9:44 AM  Result Value Ref Range   Lactic Acid, Venous 1.0 0.5 - 1.9 mmol/L  Glucose, capillary     Status: None   Collection Time: 03/15/22 11:29 AM  Result Value Ref Range   Glucose-Capillary 92 70 - 99 mg/dL     Imaging Orders         CT HEAD WO CONTRAST (5MM)         DG Chest Portable 1 View         CT ABDOMEN PELVIS W CONTRAST         DG CHEST PORT 1 VIEW         DG Abd 1 View         IR GASTROSTOMY TUBE REMOVAL/REPAIR         DG ABDOMEN PEG TUBE LOCATION       Assessment and Plan   ZAYDN MCCANTS is an 82 y.o. male with multiple medical problems who is trach dependent and admitted to the ICU with infectious issues and concern for seizure activity.  CT scan and x-rays reviewed, radiology voiced some concern for partial sigmoid volvulus.  Clinically the patient is having bowel function and his abdominal exam is relatively benign.  I recommend observation.  I do not think the imaging is convincing that this patient needs a sigmoid colectomy.  Gastroenterology has evaluated the patient as well and also recommend observation.  The surgery team will follow his clinical course.    ICD-10-CM   1. Seizure ([place])  R56.9     2. Sepsis, due to unspecified organism, unspecified whether acute organ dysfunction present ([place])  A41.9     3. Hyperammonemia ([place])  E72.20        Felicie Morn, [facility] Use [email or web address] to contact on call provider  New Patient Billing: [phone] - High MDM

## 2022-03-16 ENCOUNTER — Inpatient Hospital Stay (HOSPITAL_COMMUNITY): Payer: Medicare Other

## 2022-03-16 ENCOUNTER — Encounter (HOSPITAL_COMMUNITY): Payer: Self-pay | Admitting: Emergency Medicine

## 2022-03-16 DIAGNOSIS — R569 Unspecified convulsions: Secondary | ICD-10-CM | POA: Diagnosis not present

## 2022-03-16 LAB — GLUCOSE, CAPILLARY
Glucose-Capillary: 131 mg/dL — ABNORMAL HIGH (ref 70–99)
Glucose-Capillary: 57 mg/dL — ABNORMAL LOW (ref 70–99)
Glucose-Capillary: 85 mg/dL (ref 70–99)
Glucose-Capillary: 56 mg/dL — ABNORMAL LOW (ref 70–99)
Glucose-Capillary: 61 mg/dL — ABNORMAL LOW (ref 70–99)
Glucose-Capillary: 132 mg/dL — ABNORMAL HIGH (ref 70–99)
Glucose-Capillary: 165 mg/dL — ABNORMAL HIGH (ref 70–99)

## 2022-03-16 LAB — CULTURE, RESPIRATORY W GRAM STAIN

## 2022-03-16 LAB — BASIC METABOLIC PANEL
Anion gap: 16 — ABNORMAL HIGH (ref 5–15)
Anion gap: 15 (ref 5–15)
BUN: 5 mg/dL — ABNORMAL LOW (ref 8–23)
BUN: <5 mg/dL — ABNORMAL LOW (ref 8–23)
CO2: 25 mmol/L (ref 22–32)
CO2: 24 mmol/L (ref 22–32)
Calcium: 7.9 mg/dL — ABNORMAL LOW (ref 8.9–10.3)
Calcium: 8 mg/dL — ABNORMAL LOW (ref 8.9–10.3)
Chloride: 90 mmol/L — ABNORMAL LOW (ref 98–111)
Chloride: 95 mmol/L — ABNORMAL LOW (ref 98–111)
Creatinine, Ser: 0.53 mg/dL — ABNORMAL LOW (ref 0.61–1.24)
Creatinine, Ser: 0.47 mg/dL — ABNORMAL LOW (ref 0.61–1.24)
GFR, Estimated: >60 mL/min (ref >60)
GFR, Estimated: >60 mL/min (ref >60)
Glucose, Bld: 67 mg/dL — ABNORMAL LOW (ref 70–99)
Glucose, Bld: 61 mg/dL — ABNORMAL LOW (ref 70–99)
Potassium: 3.2 mmol/L — ABNORMAL LOW (ref 3.5–5.1)
Potassium: 4.8 mmol/L (ref 3.5–5.1)
Sodium: 131 mmol/L — ABNORMAL LOW (ref 135–145)
Sodium: 134 mmol/L — ABNORMAL LOW (ref 135–145)

## 2022-03-16 MED ORDER — MELATONIN 3 MG PO TABS
6.0000 mg | ORAL_TABLET | Freq: Every day | ORAL | Status: DC
  Administered 2022-03-16 – 2022-03-17 (×2): 6 mg
  Filled 2022-03-16 (×2): qty 2

## 2022-03-16 MED ORDER — DEXTROSE 50 % IV SOLN
INTRAVENOUS | Status: AC
  Administered 2022-03-16: 25 mL
  Filled 2022-03-16: qty 50

## 2022-03-16 MED ORDER — LEVOTHYROXINE SODIUM 25 MCG PO TABS
25.0000 ug | ORAL_TABLET | Freq: Every day | ORAL | Status: DC
  Administered 2022-03-17 – 2022-03-18 (×2): 25 ug
  Filled 2022-03-16 (×2): qty 1

## 2022-03-16 MED ORDER — POTASSIUM CHLORIDE 10 MEQ/100ML IV SOLN
10.0000 meq | INTRAVENOUS | Status: AC
  Administered 2022-03-16 (×4): 10 meq via INTRAVENOUS
  Filled 2022-03-16 (×4): qty 100

## 2022-03-16 MED ORDER — DEXTROSE 50 % IV SOLN: 12.5000 g | INTRAVENOUS | Status: AC

## 2022-03-16 MED ORDER — SODIUM CHLORIDE 3 % IN NEBU
4.0000 mL | INHALATION_SOLUTION | Freq: Two times a day (BID) | RESPIRATORY_TRACT | Status: DC
  Administered 2022-03-16 – 2022-03-18 (×4): 4 mL via RESPIRATORY_TRACT
  Filled 2022-03-16 (×4): qty 4

## 2022-03-16 MED ORDER — POTASSIUM CHLORIDE 20 MEQ PO PACK
20.0000 meq | PACK | ORAL | Status: AC
  Administered 2022-03-16 (×2): 20 meq
  Filled 2022-03-16 (×2): qty 1

## 2022-03-16 MED ORDER — DEXTROSE 10 % IV SOLN: INTRAVENOUS | Status: AC

## 2022-03-16 MED ORDER — DEXTROSE 50 % IV SOLN
12.5000 g | INTRAVENOUS | Status: AC
  Administered 2022-03-16: 12.5 g via INTRAVENOUS

## 2022-03-16 NOTE — Progress Notes (Signed)
Subjective: Patient seen and examined at bedside in presence of his nurse. Several bowel movements reported and documented.  Objective: Vital signs in last 24 hours: Temp:  [98.9 F (37.2 C)-99.7 F (37.6 C)] 99 F (37.2 C) (02/25 0800) Pulse Rate:  [71-101] 71 (02/25 0756) Resp:  [15-24] 24 (02/25 1100) BP: (109-142)/(74-97) 136/83 (02/25 1100) SpO2:  [89 %-100 %] 100 % (02/25 1100) FiO2 (%):  [40 %-45 %] 40 % (02/25 0756) Weight change:  Last BM Date : 03/15/22  PE: Awake, status post tracheostomy, on ventilator GENERAL: Not in distress, nonverbal ABDOMEN: Nondistended, bowel sounds audible, nontender, PEG tube has been replaced EXTREMITIES: No deformity  Lab Results: Results for orders placed or performed during the hospital encounter of 03/12/22 (from the past 48 hour(s))  Basic metabolic panel     Status: Abnormal   Collection Time: 03/14/22 10:02 PM  Result Value Ref Range   Sodium 134 (L) 135 - 145 mmol/L   Potassium 2.4 (LL) 3.5 - 5.1 mmol/L    Comment: CRITICAL RESULT CALLED TO, READ BACK BY AND VERIFIED WITH [email or web address] RN 02/23/2 2305 AMIREHSNANI F CORRECT DATE IS 03/14/22  CORRECTED ON 02/24 AT 0144: PREVIOUSLY REPORTED AS 2.4 CRITICAL RESULT CALLED TO, READ BACK BY AND VERIFIED WITH [email or web address] RN 02/23/2 2305 AMIREHSNANI F    Chloride 93 (L) 98 - 111 mmol/L   CO2 27 22 - 32 mmol/L   Glucose, Bld 73 70 - 99 mg/dL    Comment: Glucose reference range applies only to samples taken after fasting for at least 8 hours.   BUN 11 8 - 23 mg/dL   Creatinine, Ser 0.48 (L) 0.61 - 1.24 mg/dL   Calcium 8.3 (L) 8.9 - 10.3 mg/dL   GFR, Estimated >60 >60 mL/min    Comment: (NOTE) Calculated using the CKD-EPI Creatinine Equation (2021)    Anion gap 14 5 - 15    Comment: Performed at [facility] [address], [place], [place] [address]  Magnesium     Status: None   Collection Time: 03/14/22 10:02 PM  Result Value Ref Range   Magnesium 1.9 1.7 - 2.4 mg/dL     Comment: Performed at [facility], [employer] [address], [place], [place] [address]  Phosphorus     Status: Abnormal   Collection Time: 03/15/22  3:43 AM  Result Value Ref Range   Phosphorus 2.2 (L) 2.5 - 4.6 mg/dL    Comment: Performed at [facility] [address], [place], [place] [id number]  Basic metabolic panel     Status: Abnormal   Collection Time: 03/15/22  3:47 AM  Result Value Ref Range   Sodium 131 (L) 135 - 145 mmol/L   Potassium 3.3 (L) 3.5 - 5.1 mmol/L   Chloride 92 (L) 98 - 111 mmol/L   CO2 28 22 - 32 mmol/L   Glucose, Bld 72 70 - 99 mg/dL    Comment: Glucose reference range applies only to samples taken after fasting for at least 8 hours.   BUN 12 8 - 23 mg/dL   Creatinine, Ser 0.48 (L) 0.61 - 1.24 mg/dL   Calcium 8.0 (L) 8.9 - 10.3 mg/dL   GFR, Estimated >60 >60 mL/min    Comment: (NOTE) Calculated using the CKD-EPI Creatinine Equation (2021)    Anion gap 11 5 - 15    Comment: Performed at [facility] [address], [place],  [address]  Magnesium     Status: None  Collection Time: 03/15/22  3:47 AM  Result Value Ref Range   Magnesium 1.8 1.7 - 2.4 mg/dL    Comment: Performed at [facility], [place] [address], [place], [place] [address]  Lactic acid, plasma     Status: None   Collection Time: 03/15/22  9:44 AM  Result Value Ref Range   Lactic Acid, Venous 1.0 0.5 - 1.9 mmol/L    Comment: Performed at [facility] [address], [place], [place] [address]  Glucose, capillary     Status: None   Collection Time: 03/15/22 11:29 AM  Result Value Ref Range   Glucose-Capillary 92 70 - 99 mg/dL    Comment: Glucose reference range applies only to samples taken after fasting for at least 8 hours.  Glucose, capillary     Status: Abnormal   Collection Time: 03/15/22 11:18 PM  Result Value Ref Range   Glucose-Capillary 121 (H) 70 - 99 mg/dL    Comment: Glucose reference range applies only to samples taken after fasting for at least 8  hours.  Basic metabolic panel     Status: Abnormal   Collection Time: 03/16/22  9:15 AM  Result Value Ref Range   Sodium 131 (L) 135 - 145 mmol/L   Potassium 3.2 (L) 3.5 - 5.1 mmol/L   Chloride 90 (L) 98 - 111 mmol/L   CO2 25 22 - 32 mmol/L   Glucose, Bld 67 (L) 70 - 99 mg/dL    Comment: Glucose reference range applies only to samples taken after fasting for at least 8 hours.   BUN 5 (L) 8 - 23 mg/dL   Creatinine, Ser 0.53 (L) 0.61 - 1.24 mg/dL   Calcium 7.9 (L) 8.9 - 10.3 mg/dL   GFR, Estimated >60 >60 mL/min    Comment: (NOTE) Calculated using the CKD-EPI Creatinine Equation (2021)    Anion gap 16 (H) 5 - 15    Comment: Performed at [facility] [address], [place], [place] [address]  Glucose, capillary     Status: Abnormal   Collection Time: 03/16/22 10:30 AM  Result Value Ref Range   Glucose-Capillary 57 (L) 70 - 99 mg/dL    Comment: Glucose reference range applies only to samples taken after fasting for at least 8 hours.  Glucose, capillary     Status: Abnormal   Collection Time: 03/16/22 10:51 AM  Result Value Ref Range   Glucose-Capillary 131 (H) 70 - 99 mg/dL    Comment: Glucose reference range applies only to samples taken after fasting for at least 8 hours.    Studies/Results: IR GASTROSTOMY TUBE REMOVAL/REPAIR  Result Date: 03/15/2022 INDICATION: Patient with existing 24 Fr Kangaroo balloon retention gastrostomy tube, originally placed 10/18/20 in IR, with concern for site infection and malpositioned balloon. Request for gastrostomy exchange. EXAM: BEDSIDE REPLACEMENT OF GASTROSTOMY TUBE COMPARISON:  None Available. MEDICATIONS: None. CONTRAST:  30 mL gastrografin - administered into the gastric lumen FLUOROSCOPY TIME:  None COMPLICATIONS: None immediate. PROCEDURE: The balloon of the existing gastrostomy tube was deflated and the tube was removed easily without complication. A new 24 Fr MIC balloon retention gastrostomy was placed within the existing tract and  the balloon was inflated with 8 cc of normal saline. The balloon was pulled against the anterior inner lumen of the stomach and the external disc was cinched to the skin. The site does not show evidence of infection on exam today. Portable KUB with contrast injection was obtained and confirms appropriate positioning  and functionality of the new gastrostomy tube. The patient tolerated the procedure well without immediate postprocedural complication. IMPRESSION: Successful bedside replacement of a new 24-French gastrostomy tube. The gastrostomy tube is ready for immediate use. Read by Candiss Norse, PA-C Electronically Signed   By: Jacqulynn Cadet M.D.   On: 03/15/2022 12:24   DG ABDOMEN PEG TUBE LOCATION  Result Date: 03/15/2022 CLINICAL DATA:  Peg tube adjustment/replacement/removal. EXAM: ABDOMEN - 1 VIEW COMPARISON:  Plain films of the abdomen dated 03/15/2022 and 03/03/2022. FINDINGS: Oral contrast has been administered via the PEG tube. Contrast appears to reside in the bowel only, with no evidence of extraluminal contrast, indicating proper positioning. Overall bowel gas pattern appears similar in the short-term interval, again suggesting ileus. IMPRESSION: 1. Oral contrast appears to reside in the bowel only, with no evidence of extraluminal contrast, indicating proper positioning of the PEG tube. 2. Overall bowel gas pattern is stable in the short-term interval, again suggesting ileus. Electronically Signed   By: Franki Cabot M.D.   On: 03/15/2022 11:27   DG Abd 1 View  Result Date: 03/15/2022 CLINICAL DATA:  Abdominal distension EXAM: ABDOMEN - 1 VIEW COMPARISON:  Plain film of the abdomen dated 03/03/2022. CT abdomen and pelvis dated 03/13/2022. FINDINGS: At least mild gaseous distention of the entire large bowel, particularly prominent at the level of the cecum. Gas is seen at the rectal vault. Questionable mildly prominent gas-filled loops of small bowel within the central abdomen. No  evidence of soft tissue mass or abnormal fluid collection. No evidence of free intraperitoneal air is seen. 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 IVC filter in place. IMPRESSION: At least mild gaseous distention of the entire large bowel, particularly prominent at the level of the cecum. Gas is seen at the rectal vault. Findings could represent a generalized ileus, less likely colonic pseudoobstruction. Electronically Signed   By: Franki Cabot M.D.   On: 03/15/2022 08:52    Medications: I have reviewed the patient's current medications.  Assessment: Colonic distention, abdominal x-ray reviewed from today, official read pending, appears stable or slightly improved. Patient continues to have several bowel movements without worsening of abdominal distention. PEG tube has been replaced by IR and is available for immediate use.  Multiple comorbidities: Status post trach, on ventilator, PAF, MVP status post mitral valve annuloplasty, PAD, CAD, heart failure, OSA, recurrent sepsis with multidrug resistant organisms  Plan: Clinical picture and abdominal x-ray not compatible with volvulus. Okay to start feeding from PEG tube. Use MiraLAX if needed. Try to keep potassium above 4 and magnesium above 2. Currently endoscopic intervention not needed. GI will sign off, please recall if needed.  Ronnette Juniper, MD 03/16/2022, 12:06 PM

## 2022-03-16 NOTE — Progress Notes (Addendum)
[facility] Progress Note Patient Name: Austin Davis DOB: [birth year] MRN: [id number]   Date of Service  03/16/2022  HPI/Events of Note  Bedside is requesting for a dextrose gtt because he is having hypoglycemia events. No TF d/t generalized ileus versus colonic pseudo-obstruction.  Chronic trach/peg status.  HFrEF Seizures.  Cr normal, hyponatremia. Low potassium from AM   eICU Interventions  D10 iV fluid ordered, call back if CBG > 180 x 2.  BMP stat, sodium at mid night     Intervention Category Intermediate Interventions: Other:  Elmer Sow 03/16/2022, 7:31 PM  0142: asking to see if cont dextrose can be increased slightly,  hypoglycemic orders were placed earlier and BSRN tx with D50 and has been monitoirng CBG's every hour but the last one has come down to 85   T/V with PEG, no current feedings at the time  - increased to d10 at 20 .

## 2022-03-16 NOTE — Progress Notes (Signed)
LTM maint complete - no skin breakdown under:  A2 Fp2  [facility] monitored, Event button test confirmed by [facility].

## 2022-03-16 NOTE — Progress Notes (Signed)
Progress Note: General Surgery Service   Chief Complaint/Subjective: Non-verbal, having bowel movements.  Objective: Vital signs in last 24 hours: Temp:  [98.6 F (37 C)-99.7 F (37.6 C)] 99 F (37.2 C) (02/25 0800) Pulse Rate:  [71-101] 71 (02/25 0756) Resp:  [16-24] 20 (02/25 0756) BP: (109-154)/(74-104) 129/89 (02/25 0756) SpO2:  [89 %-100 %] 100 % (02/25 0756) FiO2 (%):  [40 %-45 %] 40 % (02/25 0756) Last BM Date : 03/15/22  Intake/Output from previous day: 02/24 0701 - 02/25 0700 In: 1544.1 [IV Piggyback:1544.1] Out: [id number] [Urine:1245] Intake/Output this shift: No intake/output data recorded.  Constitutional: Trach in place, connected to neuromonitoring Eyes: Moist conjunctiva; no lid lag; anicteric; PERRL Neck: Tracheostomy in place; no thyromegaly Lungs: Ventilated; no tactile fremitus CV: RRR; no palpable thrills; no pitting edema GI: Abd G-tube in place, abdomen soft, nontender, nondistended; no palpable hepatosplenomegaly MSK: Normal range of motion of extremities; no clubbing/cyanosis Psychiatric: Appropriate affect; alert and oriented x3 Lymphatic: No palpable cervical or axillary lymphadenopathy   Lab Results: CBC  Recent Labs    03/14/22 0604  WBC 11.9*  HGB 9.1*  HCT 28.6*  PLT 421*   BMET Recent Labs    03/14/22 2202 03/15/22 0347  NA 134* 131*  K 2.4* 3.3*  CL 93* 92*  CO2 27 28  GLUCOSE 73 72  BUN 11 12  CREATININE 0.48* 0.48*  CALCIUM 8.3* 8.0*   PT/INR No results for input(s): "LABPROT", "INR" in the last 72 hours. ABG No results for input(s): "PHART", "HCO3" in the last 72 hours.  Invalid input(s): "PCO2", "PO2"  Anti-infectives: Anti-infectives (From admission, onward)    Start     Dose/Rate Route Frequency Ordered Stop   03/14/22 1445  meropenem (MERREM) 1 g in sodium chloride 0.9 % 100 mL IVPB        1 g 200 mL/hr over 30 Minutes Intravenous Every 8 hours 03/14/22 1356     03/14/22 1100  micafungin (MYCAMINE) 100 mg in  sodium chloride 0.9 % 100 mL IVPB        100 mg 105 mL/hr over 1 Hours Intravenous Every 24 hours 03/14/22 1036     03/13/22 1400  meropenem (MERREM) 2 g in sodium chloride 0.9 % 100 mL IVPB  Status:  Discontinued        2 g 280 mL/hr over 30 Minutes Intravenous Every 8 hours 03/13/22 0833 03/13/22 0901   03/13/22 1400  piperacillin-tazobactam (ZOSYN) IVPB 3.375 g  Status:  Discontinued        3.375 g 12.5 mL/hr over 240 Minutes Intravenous Every 8 hours 03/13/22 0901 03/14/22 1356   03/13/22 1000  vancomycin (VANCOREADY) IVPB 500 mg/100 mL  Status:  Discontinued        500 mg 100 mL/hr over 60 Minutes Intravenous Every 12 hours 03/12/22 2325 03/13/22 0008   03/13/22 0800  acyclovir (ZOVIRAX) 525 mg in dextrose 5 % 100 mL IVPB  Status:  Discontinued        10 mg/kg  52.4 kg 110.5 mL/hr over 60 Minutes Intravenous Every 8 hours 03/12/22 2325 03/13/22 0026   03/13/22 0800  acyclovir (ZOVIRAX) 500 mg in dextrose 5 % 100 mL IVPB  Status:  Discontinued        500 mg 110 mL/hr over 60 Minutes Intravenous Every 8 hours 03/13/22 0026 03/13/22 0900   03/13/22 0600  meropenem (MERREM) 2 g in sodium chloride 0.9 % 100 mL IVPB  Status:  Discontinued  2 g 280 mL/hr over 30 Minutes Intravenous Every 8 hours 03/12/22 2325 03/13/22 0803   03/13/22 0030  linezolid (ZYVOX) IVPB 600 mg        600 mg 300 mL/hr over 60 Minutes Intravenous Every 12 hours 03/12/22 2350     03/12/22 2130  acyclovir (ZOVIRAX) 525 mg in dextrose 5 % 100 mL IVPB        10 mg/kg  52.4 kg 110.5 mL/hr over 60 Minutes Intravenous  Once 03/12/22 2011 03/12/22 2316   03/12/22 2030  meropenem (MERREM) 2,000 mg in sodium chloride 0.9 % 100 mL IVPB        2,000 mg 280 mL/hr over 30 Minutes Intravenous  Once 03/12/22 2028 03/12/22 2229   03/12/22 2030  vancomycin (VANCOREADY) IVPB 1250 mg/250 mL        1,250 mg 166.7 mL/hr over 90 Minutes Intravenous  Once 03/12/22 2028 03/12/22 2347   03/12/22 2015  ampicillin (OMNIPEN) 2 g in  sodium chloride 0.9 % 100 mL IVPB  Status:  Discontinued        2 g 300 mL/hr over 20 Minutes Intravenous  Once 03/12/22 2008 03/12/22 2028   03/12/22 2015  cefTRIAXone (ROCEPHIN) 2 g in sodium chloride 0.9 % 100 mL IVPB  Status:  Discontinued        2 g 200 mL/hr over 30 Minutes Intravenous  Once 03/12/22 2008 03/12/22 2011   03/12/22 2015  ceFEPIme (MAXIPIME) 2 g in sodium chloride 0.9 % 100 mL IVPB  Status:  Discontinued        2 g 200 mL/hr over 30 Minutes Intravenous  Once 03/12/22 2011 03/12/22 2013   03/12/22 2015  ampicillin (OMNIPEN) 2 g in sodium chloride 0.9 % 100 mL IVPB  Status:  Discontinued        2 g 300 mL/hr over 20 Minutes Intravenous  Once 03/12/22 2011 03/12/22 2028       Medications: Scheduled Meds:  Chlorhexidine Gluconate Cloth  6 each Topical Daily   heparin  5,000 Units Subcutaneous Q8H   mouth rinse  15 mL Mouth Rinse Q2H   pantoprazole (PROTONIX) IV  40 mg Intravenous Q12H   potassium chloride  40 mEq Per Tube Once   Continuous Infusions:  sodium chloride     linezolid (ZYVOX) IV Stopped (03/15/22 2319)   meropenem (MERREM) IV Stopped (03/16/22 0544)   micafungin (MYCAMINE) 100 mg in sodium chloride 0.9 % 100 mL IVPB Stopped (03/15/22 1022)   norepinephrine (LEVOPHED) Adult infusion     PRN Meds:.acetaminophen (TYLENOL) oral liquid 160 mg/5 mL, docusate, fentaNYL (SUBLIMAZE) injection, midazolam, polyethylene glycol  Assessment/Plan: Austin Davis is an 82 y.o. male with multiple medical problems who is trach dependent and admitted to the ICU with infectious issues and concern for seizure activity.  CT scan and x-rays reviewed, radiology voiced some concern for partial sigmoid volvulus.    Patient evaluated by gastroenterology as well.   Bowel movements continue overnight.  PEG tube adjusted (had migrated into duodenum).  Difficult to assess pain level, but abdomen soft.  I do not think Austin Davis requires or would benefit from surgical  intervention.  Surgery team will sign off and be available if needed.    LOS: 4 days    Felicie Morn, MD  [facility] Use [email or web address] to contact on call provider  Daily Billing: [phone] - Straightforward / Low MDM

## 2022-03-16 NOTE — Progress Notes (Signed)
Neurology Progress Note  Patient ID: Austin Davis is a 82 y.o. with PMHx of  has a past medical history of CHF (congestive heart failure) ([place]), Coronary artery disease, Essential tremor, GI bleed (2019), Headache, Low blood sugar, Mitral regurgitation, Mitral valve prolapse, Osteopenia (2021), Paroxysmal atrial fibrillation ([place]), and Tremor.  Subjective: - Alert / communicates but difficult to understand due to mouth tremor  Exam: Current vital signs: BP 114/75   Pulse 77   Temp 99 F (37.2 C) (Axillary)   Resp 20   Ht '5\' 10"'$  (1.778 m)   Wt 55.9 kg   SpO2 100%   BMI 17.68 kg/m  Vital signs in last 24 hours: Temp:  [98.6 F (37 C)-99.7 F (37.6 C)] 99 F (37.2 C) (02/25 0800) Pulse Rate:  [77-101] 77 (02/25 0333) Resp:  [16-24] 20 (02/25 0700) BP: (109-154)/(74-104) 114/75 (02/25 0700) SpO2:  [89 %-100 %] 100 % (02/25 0700) FiO2 (%):  [40 %-45 %] 40 % (02/25 0333)  Physical Exam  General: Chronically ill-appearing,  HEENT-temporal muscle wasting Cardiovascular- S1-S2 audible, pulses palpable throughout   Lungs-tracheostomy in place Extremities- Warm, dry and intact Musculoskeletal-diffuse muscle atrophy Skin-PEG site lesion as described above   Neurological Examination Mental Status: Alert, mouths words to examiner but difficult to comprehend especially in the setting of his mouth tremor. Follows commands bilaterally Cranial Nerves: II: Orients to stimuli in all fields  III,IV, VI: Pupils equal round reactive 4 mm to 2 mm, mild right eye ptosis orients to examiner bilaterally, clearly looks more readily to the right than to the left (delayed burying to the left), saccadic pursuits V,VII: Face symmetric, sensation intact bilateral face VIII: hearing intact to voice Motor: Diffuse atrophy.  Antigravity with the bilateral upper extremities and gestures with the arms spontaneously. Deltoids 2/5, Biceps 4/5, Triceps 2/5, Legs 1/5 More brisk on the right than the left  generally  Sensory: Grossly equally reactive to light touch throughout  Pertinent Labs:  Basic Metabolic Panel: Recent Labs  Lab 03/12/22 2053 03/12/22 2100 03/14/22 0604 03/14/22 2202 03/15/22 0343 03/15/22 0347  NA 135 133* 132* 134*  --  131*  K 4.9 5.1 2.8* 2.4*  --  3.3*  CL 92*  --  93* 93*  --  92*  CO2 35*  --  29 27  --  28  GLUCOSE 152*  --  85 73  --  72  BUN 27*  --  14 11  --  12  CREATININE 0.37*  --  0.38* 0.48*  --  0.48*  CALCIUM 9.0  --  8.7* 8.3*  --  8.0*  MG  --   --  1.8 1.9  --  1.8  PHOS  --   --  2.8  --  2.2*  --      CBC: Recent Labs  Lab 03/12/22 2053 03/12/22 2100 03/14/22 0604  WBC 17.6*  --  11.9*  NEUTROABS 16.9*  --   --   HGB 10.7* 11.9* 9.1*  HCT 35.9* 35.0* 28.6*  MCV 88.6  --  83.1  PLT 451*  --  421*     Coagulation Studies: No results for input(s): "LABPROT", "INR" in the last 72 hours.   LTM EEG read pending   Assessment:  - Exam reveals bilateral upper extremity tremor as well as mouth tremor c/w essential tremor diagnosis as well as right gaze preference  - EEG negative for 3 days now, without sleep (which would further lower seizure threshold) -  Do not have any concern for underlying epilepsy at this time. Would workup future episodes of reduced responsiveness with toxic/metabolic evaluation  Recommendations: - Appreciate management of comorbidities per CCM team - No indication of antiseizure medications - Neurology will be available as needed  Lesleigh Noe MD-PhD [facility] [phone]  Available 7 AM to 7 PM, outside these hours please contact Neurologist on call listed on AMION   >35 min of care, majority at bedside

## 2022-03-16 NOTE — Progress Notes (Signed)
Brief ID note  2/21 and 2/22 blood Cx NG -stop micafungin

## 2022-03-16 NOTE — Procedures (Signed)
Patient Name: Austin Davis  MRN: [id number]  Epilepsy Attending: Lora Havens  Referring Physician/Provider: Lorenza Chick, MD  Duration: 03/15/2022 1355 to 03/16/2022 1355   Patient history: 82yo M with h/o history of seizure like episodes in the past. EEG to evaluate for seizure.    Level of alertness:  awake   AEDs during EEG study: None   Technical aspects: This EEG study was done with scalp electrodes positioned according to the 10-20 International system of electrode placement. Electrical activity was reviewed with band pass filter of 1-'70Hz'$ , sensitivity of 7 uV/mm, display speed of 49m/sec with a '60Hz'$  notched filter applied as appropriate. EEG data were recorded continuously and digitally stored.  Video monitoring was available and reviewed as appropriate.   Description: The posterior dominant rhythm consists of 8-9 Hz activity of moderate voltage (25-35 uV) seen predominantly in posterior head regions, symmetric and reactive to eye opening and eye closing. EEG showed intermittent generalized 3 to 6 Hz theta-delta slowing. Hyperventilation and photic stimulation were not performed.      On event was noted on 03/16/2022 at 0752. Patient was awake, had right hand tremor like movement. Concomitant EEG before, during and after the event didn't show any EEG change to suggest seizure.     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 No seizures or epileptiform discharges were seen throughout the recording.   On event was noted on 03/16/2022 at 0752. Patient was awake, had right hand tremor like movement. Concomitant EEG didn't show any EEG change to suggest seizure. This was not an epileptic event.     Austin Davis [name]

## 2022-03-16 NOTE — Progress Notes (Addendum)
NAME:  Austin Davis, MRN:  [id number], DOB:  July 19, 1940, LOS: 4 ADMISSION DATE:  03/12/2022, CONSULTATION DATE:  03/12/2022 REFERRING MD:  Armandina Gemma - EDP, CHIEF COMPLAINT:  Seizure activity   History of Present Illness:  82 year old man residing at [place] who presented to [facility] 2/21 for possible seizure activity. 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 Multiple recent admissions to [facility] with recurrent sepsis, infection with multidrug-resistant organisms (ESBL, Pseudomonas, Klebsiella, Enterococcus)  Patient was recently admitted from 2/7 - 2/12 with encephalopathy, concern for seizures and PsA VAP with ESBL E. Coli UTI.  Treated Zyvox and meropenem and discharged back to [facility].  EEG was negative and he was at his baseline mental status at the time of discharge, awake and following commands.   On 2/21, patient reportedly had seizure activity while at [facility]; no clear history available but presented to the ED with AMS. CT Head was negative, he was hemodynamically stable with labs significant for LA 1.7, ammonia 107, WBC 17.6, glucose 152, EEG completed but not read yet. Loaded with Keppra and treated empiric meningitis with acyclovir, meropenem, and vancomycin.  PCCM consulted for admission.  Pertinent Medical History:   Past Medical History:  Diagnosis Date   CHF (congestive heart failure) ([place])    Coronary artery disease    Essential tremor    GI bleed 2019   hospitalized at [facility] for one week   Headache    since childhood   Low blood sugar    since childhood, controlled by diet   Mitral regurgitation    Mitral valve prolapse    Osteopenia 2021   Paroxysmal atrial fibrillation ([place])    Tremor    Significant Hospital Events: Including procedures, antibiotic start and stop dates in addition to other  pertinent events   2/21 Admit to PCCM. 2/22 CT Head NAICA, generalized atrophy. STAT EEG without seizure activity. Persistent Parkinsonian tremoring of BUE. Urinary retention with UA c/f persistent infection. PEG site erythematous, indurated and with pus present. CT A/P demonstrating air-fluid levels in distended colon, ?ileus vs. Volvulus, bibasilar airspace opacities, PEG in second portion of duodenum, numerous thoracic compression fx, multiple nonobstructing renal stones, bladder outlet obstruction (chronic). Keppra stopped, LTM EEG to r/o seizure. Mero -> Zosyn in the setting of seizure risk. 2/23 Low grade fever, tachy with ?Afib RVR to 140s; resolved with metop '5mg'$  x 1. PEG with dark brown ?feculent discharge. IR consulted for eval/exchange. Mica added for fungal coverage; ID consulted. LTM EEG ongoing. 2/24 asked gensx and GI to eval possible underlying sig vol.   Interim History / Subjective:   Chronically critically ill. Trach in place. On vent. And ltveeg   Objective:  Blood pressure 114/75, pulse 77, temperature 98.9 F (37.2 C), temperature source Oral, resp. rate 20, height '5\' 10"'$  (1.778 m), weight 55.9 kg, SpO2 100 %.    Vent Mode: PCV FiO2 (%):  [40 %-45 %] 40 % Set Rate:  [20 bmp] 20 bmp PEEP:  [5 cmH20] 5 cmH20 Plateau Pressure:  [27 cmH20-30 cmH20] 29 cmH20   Intake/Output Summary (Last 24 hours) at 03/16/2022 0756 Last data filed at 03/16/2022 0600 Gross per 24 hour  Intake 1544.08 ml  Output 1245 ml  Net 299.08 ml   Filed Weights   03/13/22 1100 03/14/22 0500 03/15/22 0500  Weight: 52.4 kg 58.1 kg 55.9 kg  Physical Examination: General: critically ill, chronically ill HEENT: NCAT, trach in place  Neck: thin secretions from trach  Neuro: alert to voice, following commands, attempting to communicate this AM  CV: irregular, s1 s2  PULM: BL vented breaths  GI: minimal drainage from peg site  Extremities: no edema   Resolved Hospital Problem List:     Assessment & Plan:   Acute Encephalopathy Seizure activity, on LTVEEG History of Parkinson's disease Elevated Ammonia CT Head 2/21 [place]. - multifactorial problem  P: Remains on EEG  I discussed case with neuro He has a baseline right gaze preference and essential tremor. So every time he has AMS everyone seems to think he is seizes. He have no evidence of seizure.  I appreciate neuro recs   Sepsis, unknown source, multifactorial, present on admission  History of multiple admissions for recurrent ESBL infections Recent PsA HCAP and ESBL E. Coli UTI Chronic bladder outlet obstruction P: Off pressors at this time  Continue abx per ID Meropenem, zyvox and micafungin   Abdominal pain ?Ileus vs. volvulus Concern for PEG tube site infection History of GIB CT A/P demonstrating air-fluid levels in distended colon, ?ileus vs. Volvulus, bibasilar airspace opacities, PEG in second portion of duodenum, numerous thoracic compression fx, multiple nonobstructing renal stones, bladder outlet obstruction (chronic). 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 P: Remains on tele   Hypothyroidism - Continue levothyroxine    Best Practice: (right click and "Reselect all SmartList Selections" daily)   Diet/type: NPO DVT prophylaxis: prophylactic heparin  GI prophylaxis: PPI Lines: N/A Foley:  N/A Code Status:  full code Last date of multidisciplinary goals of care discussion [pending]  This patient is critically ill with multiple organ system failure; which, requires frequent high complexity decision making, assessment, support, evaluation, and titration of therapies. This was completed through the application of  advanced monitoring technologies and extensive interpretation of multiple databases. During this encounter critical care time was devoted to patient care services described in this note for 32 minutes.  Garner Nash, DO [facility] 03/16/2022 7:56 AM

## 2022-03-17 DIAGNOSIS — K9423 Gastrostomy malfunction: Secondary | ICD-10-CM

## 2022-03-17 DIAGNOSIS — T85528A Displacement of other gastrointestinal prosthetic devices, implants and grafts, initial encounter: Secondary | ICD-10-CM

## 2022-03-17 DIAGNOSIS — N390 Urinary tract infection, site not specified: Secondary | ICD-10-CM

## 2022-03-17 DIAGNOSIS — J189 Pneumonia, unspecified organism: Secondary | ICD-10-CM

## 2022-03-17 LAB — GLUCOSE, CAPILLARY
Glucose-Capillary: 117 mg/dL — ABNORMAL HIGH (ref 70–99)
Glucose-Capillary: 124 mg/dL — ABNORMAL HIGH (ref 70–99)
Glucose-Capillary: 139 mg/dL — ABNORMAL HIGH (ref 70–99)
Glucose-Capillary: 154 mg/dL — ABNORMAL HIGH (ref 70–99)
Glucose-Capillary: 80 mg/dL (ref 70–99)
Glucose-Capillary: 81 mg/dL (ref 70–99)
Glucose-Capillary: 85 mg/dL (ref 70–99)
Glucose-Capillary: 87 mg/dL (ref 70–99)

## 2022-03-17 LAB — URINE CULTURE: Culture: 80000 — AB

## 2022-03-17 LAB — PHOSPHORUS
Phosphorus: 1.6 mg/dL — ABNORMAL LOW (ref 2.5–4.6)
Phosphorus: 5.2 mg/dL — ABNORMAL HIGH (ref 2.5–4.6)
Phosphorus: 3.9 mg/dL (ref 2.5–4.6)

## 2022-03-17 LAB — SODIUM: Sodium: 129 mmol/L — ABNORMAL LOW (ref 135–145)

## 2022-03-17 LAB — CULTURE, BLOOD (ROUTINE X 2): Culture: NO GROWTH

## 2022-03-17 LAB — MAGNESIUM
Magnesium: 1.9 mg/dL (ref 1.7–2.4)
Magnesium: 2.3 mg/dL (ref 1.7–2.4)
Magnesium: 2.2 mg/dL (ref 1.7–2.4)

## 2022-03-17 MED ORDER — OSMOLITE 1.5 CAL PO LIQD
1000.0000 mL | ORAL | Status: DC
  Administered 2022-03-17: 1000 mL

## 2022-03-17 MED ORDER — SERTRALINE HCL 50 MG PO TABS
50.0000 mg | ORAL_TABLET | Freq: Every day | ORAL | Status: DC
  Administered 2022-03-17 – 2022-03-18 (×2): 50 mg
  Filled 2022-03-17 (×2): qty 1

## 2022-03-17 MED ORDER — MAGNESIUM SULFATE 2 GM/50ML IV SOLN
2.0000 g | Freq: Once | INTRAVENOUS | Status: AC
  Administered 2022-03-17: 2 g via INTRAVENOUS
  Filled 2022-03-17: qty 50

## 2022-03-17 MED ORDER — PROSOURCE TF20 ENFIT COMPATIBL EN LIQD
60.0000 mL | Freq: Every day | ENTERAL | Status: DC
  Administered 2022-03-17: 60 mL
  Filled 2022-03-17: qty 60

## 2022-03-17 MED ORDER — VITAL HIGH PROTEIN PO LIQD: 1000.0000 mL | ORAL | Status: DC

## 2022-03-17 MED ORDER — SODIUM PHOSPHATES 45 MMOLE/15ML IV SOLN
30.0000 mmol | Freq: Once | INTRAVENOUS | Status: AC
  Administered 2022-03-17: 30 mmol via INTRAVENOUS
  Filled 2022-03-17: qty 10

## 2022-03-17 MED ORDER — FUROSEMIDE 10 MG/ML IJ SOLN
40.0000 mg | Freq: Once | INTRAMUSCULAR | Status: AC
  Administered 2022-03-17: 40 mg via INTRAVENOUS
  Filled 2022-03-17: qty 4

## 2022-03-17 NOTE — Discharge Summary (Signed)
Physician Discharge Summary  Patient ID: Austin Davis MRN: [id number] DOB/AGE: May 21, 1940 82 y.o.  Admit date: 03/12/2022 Discharge date: 03/17/2022  Problem List Principal Problem:   Seizure [facility]) Active Problems:   PEG tube malfunction (HCC)   Urinary tract infection without hematuria  HPI:  Chronically ill 82 year old man who is trach and vent dependent and resides at [place]. He has HFrEF, history of parkinsonian bulbar movements/essential tremor, frequent infections (UTIs, VAP, HCAP) with multidrug-resistant organisms including Pseudomonas, Klebsiella, Enterococcus, E. Coli. He has frequent hospital readmissions from [place]. Just discharged back to [facility] 2/12. He was brought from [facility] to the ED 2/21 with seizure activity, leukocytosis. He was loaded with Keppra, treated empirically for possible meningitis with acyclovir, meropenem, vancomycin. Lactic acid normal. Chest x-ray with linear basilar atelectasis but no clear infiltrates. Tracheostomy tube is in place with apparent overinflation of his cuff balloon.   Hospital Course: 2/21 admitted to [facility] ICU from [place] after an apparent seizure  2/22 no seizure on eeg, movement is probably his parkinsonian movements. AEDs dc 2/23 Id consult given sepsis unclear source with hx numerous MDROs. Continued zyvox, zosyn changed to mero, mica continued   2/24 air fluid level in colon-- d/w GI and CCS-- no indication for scope w GI, CCS rec obs of possible underlying sig vol 2/25 ID dc mica  2/26 stopping eeg.  2/27 stable for discharge   General:  elderly M, resting in bed in no distress HEENT: MM pink/moist. Trach in place Neuro: awake, follows commands, baseline resting tremor CV: s1s2 rrr, no m/r/g PULM:  bilateraly course crackles, no wheezing or rhonchi GI: soft, PEG in place Extremities: warm/dry, no edema  Skin: no rashes or lesions  Plan at discharge   Essential tremor / suspected parkinson's -Seizure again ruled  out -It seems that with his baseline R gaze preference and essential tremor, when he has any change in mental status there is suspicion for sz. Neuro evals have not yielded any confirmation of sz.  P -does not need AEDs at this time  Chronic respiratory failure Trach and vent dependence   -pulm hygiene, VAP prevention -wean as tolerated -Family continues to be hopeful for vent liberation but I do not think that is a realistic goal unfortunately   Sepsis due to UTI Hx MDRO -mero, zyvox while inpatient -continue Meropenem until 3/1  Ileus PEG in situ Prior hx GIB -volvulus ruled out -seen by IR, GI, CCS -- no interventions P -cont EN   pAfib, now sinus -no AC with hx GIB   Hypothyroidism -levothyroxine    Labs at discharge Lab Results  Component Value Date   CREATININE 0.47 (L) 03/16/2022   BUN <5 (L) 03/16/2022   NA 129 (L) 03/17/2022   K 4.8 03/16/2022   CL 95 (L) 03/16/2022   CO2 24 03/16/2022   Lab Results  Component Value Date   WBC 11.9 (H) 03/14/2022   HGB 9.1 (L) 03/14/2022   HCT 28.6 (L) 03/14/2022   MCV 83.1 03/14/2022   PLT 421 (H) 03/14/2022   Lab Results  Component Value Date   ALT 42 03/14/2022   AST 26 03/14/2022   ALKPHOS 96 03/14/2022   BILITOT 0.8 03/14/2022   Lab Results  Component Value Date   INR 1.1 03/12/2022   INR 1.3 (H) 01/04/2022   INR 1.2 10/26/2021    Current radiology studies DG Abd 1 View  Result Date: 03/16/2022 CLINICAL DATA:  Ileus. EXAM: ABDOMEN - 1 VIEW COMPARISON:  Radiographs  yesterday. CT 03/13/2022 FINDINGS: The contrast previously within small bowel is now seen within the colon. Mild colonic distension with air and contrast. No definite small bowel distension. Unchanged positioning of IVC filter. Grossly stable radiographic appearance of gastrostomy tube. IMPRESSION: The contrast previously within small bowel is now seen within the colon. Mild colonic distension with air and contrast, possible colonic ileus. No  significant small bowel distension. Electronically Signed   By: Keith Rake M.D.   On: 03/16/2022 12:25    Disposition:   There are no questions and answers to display.         Allergies as of 03/18/2022       Reactions   Prednisone Other (See Comments)   Listed as allergy on MAR Unknown reaction   Cortisone Other (See Comments)   Listed as allergy per MAR Unknown reaction        Medication List     STOP taking these medications    levETIRAcetam 100 MG/ML solution Commonly known as: KEPPRA   valproic acid 250 MG/5ML solution Commonly known as: DEPAKENE       TAKE these medications    acetaminophen 325 MG tablet Commonly known as: TYLENOL Place 650 mg into feeding tube every 8 (eight) hours as needed for mild pain or fever.   ALPRAZolam 0.5 MG tablet Commonly known as: XANAX Place 0.5 mg into feeding tube every 8 (eight) hours as needed for anxiety.   CULTURELLE PO Place 1 capsule into feeding tube every morning.   esomeprazole 40 MG capsule Commonly known as: NEXIUM 40 mg daily. Via feeding tube   feeding supplement (OSMOLITE 1.5 CAL) Liqd Place 60 mL/hr into feeding tube 2 (two) times daily.   Fish Oil 1000 MG Caps Place 1,000 mg into feeding tube every morning.   fluticasone 50 MCG/ACT nasal spray Commonly known as: FLONASE Place 1 spray into both nostrils every 6 (six) hours as needed for allergies or rhinitis.   folic acid 1 MG tablet Commonly known as: FOLVITE Take 1 tablet (1 mg total) by mouth daily. What changed: how to take this   furosemide 20 MG tablet Commonly known as: LASIX Place 1 tablet (20 mg total) into feeding tube daily as needed for fluid or edema.   hydrOXYzine 50 MG tablet Commonly known as: ATARAX Place 50 mg into feeding tube every 6 (six) hours.   hydrOXYzine 25 MG tablet Commonly known as: ATARAX Place 25 mg into feeding tube every 6 (six) hours as needed for anxiety.   lactulose 10 GM/15ML  solution Commonly known as: CHRONULAC Place 20 g into feeding tube every 12 (twelve) hours as needed (constipation).   levalbuterol 1.25 MG/3ML nebulizer solution Commonly known as: XOPENEX Take 1.25 mg by nebulization every 6 (six) hours.   levothyroxine 25 MCG tablet Commonly known as: SYNTHROID Place 25 mcg into feeding tube every morning.   Melatonin 3 MG Tbdp Place 6 mg into feeding tube at bedtime.   meropenem 1 g in sodium chloride 0.9 % 100 mL Inject 1 g into the vein every 8 (eight) hours for 3 days.   metoprolol tartrate 25 MG tablet Commonly known as: LOPRESSOR Place 25 mg into feeding tube as needed (AFIB. Do not administer if low BP, systolic [id number]).   midodrine 10 MG tablet Commonly known as: PROAMATINE Place 1 tablet (10 mg total) into feeding tube 3 (three) times daily with meals. What changed: when to take this   multivitamins with iron Tabs tablet Take 1 tablet  per tube daily   polyethylene glycol 17 g packet Commonly known as: MIRALAX / GLYCOLAX Place 17 g into feeding tube every other day.   propranolol 10 MG tablet Commonly known as: INDERAL Place 10 mg into feeding tube daily as needed (Tremor. Hold if HR is less than 60 or SBP less than 90).   sertraline 50 MG tablet Commonly known as: ZOLOFT Place 50 mg into feeding tube daily.   sterile water for irrigation Irrigate with 100 mLs as directed See admin instructions. 100 ml H2O flush every 4 hours   tamsulosin 0.4 MG Caps capsule Commonly known as: FLOMAX 0.4 mg daily. Via feeding tube          Discharged Condition: stable  Time spent on discharge greater than 40 minutes.  Vital signs at Discharge. Temp:  [97.8 F (36.6 C)-99.7 F (37.6 C)] 99.5 F (37.5 C) (02/26 1600) Pulse Rate:  [67-75] 74 (02/26 0329) Resp:  [15-28] 21 (02/26 1500) BP: (100-143)/(66-113) 142/111 (02/26 1500) SpO2:  [95 %-100 %] 96 % (02/26 1521) FiO2 (%):  [40 %] 40 % (02/26 1521) Weight:  [56.2 kg] 56.2 kg  (02/26 0900)   Otilio Carpen Jery Hollern, PA-C [facility] See Amion for pager If no response to pager , please call [phone] until 7pm After 7:00 pm call Elink  [phone]?[place]

## 2022-03-17 NOTE — Procedures (Signed)
Patient Name: Austin Davis  MRN: [id number]  Epilepsy Attending: Lora Havens  Referring Physician/Provider: Lorenza Chick, MD  Duration: 03/16/2022 1355 to 03/17/2022 1531   Patient history: 82yo M with h/o history of seizure like episodes in the past. EEG to evaluate for seizure.    Level of alertness:  awake   AEDs during EEG study: None   Technical aspects: This EEG study was done with scalp electrodes positioned according to the 10-20 International system of electrode placement. Electrical activity was reviewed with band pass filter of 1-'70Hz'$ , sensitivity of 7 uV/mm, display speed of 50m/sec with a '60Hz'$  notched filter applied as appropriate. EEG data were recorded continuously and digitally stored.  Video monitoring was available and reviewed as appropriate.   Description: The posterior dominant rhythm consists of 8-9 Hz activity of moderate voltage (25-35 uV) seen predominantly in posterior head regions, symmetric and reactive to eye opening and eye closing. EEG showed intermittent generalized 3 to 6 Hz theta-delta slowing. Hyperventilation and photic stimulation were not performed.     Patient was noted to have frequent face and hand twitching. Concomitant EEG before, during and after the event did not show any EEG changes suggest seizure.      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 No seizures or epileptiform discharges were seen throughout the recording.  Patient was noted to have frequent face and hand twitching without concomitant EEG change. These episodes were nonepileptic.     Jodey Burbano [name]

## 2022-03-17 NOTE — Progress Notes (Signed)
NAME:  Austin Davis, MRN:  [id number], DOB:  10/29/1940, LOS: 5 ADMISSION DATE:  03/12/2022, CONSULTATION DATE:  03/12/2022 REFERRING MD:  Armandina Gemma - EDP, CHIEF COMPLAINT:  Seizure activity   History of Present Illness:  82 year old man residing at [place] who presented to [facility] 2/21 for possible seizure activity. 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 Multiple recent admissions to [facility] with recurrent sepsis, infection with multidrug-resistant organisms (ESBL, Pseudomonas, Klebsiella, Enterococcus)  Patient was recently admitted from 2/7 - 2/12 with encephalopathy, concern for seizures and PsA VAP with ESBL E. Coli UTI.  Treated Zyvox and meropenem and discharged back to [facility].  EEG was negative and he was at his baseline mental status at the time of discharge, awake and following commands.   On 2/21, patient reportedly had seizure activity while at [facility]; no clear history available but presented to the ED with AMS. CT Head was negative, he was hemodynamically stable with labs significant for LA 1.7, ammonia 107, WBC 17.6, glucose 152, EEG completed but not read yet. Loaded with Keppra and treated empiric meningitis with acyclovir, meropenem, and vancomycin.  PCCM consulted for admission.  Pertinent Medical History:   Past Medical History:  Diagnosis Date   CHF (congestive heart failure) ([place])    Coronary artery disease    Essential tremor    GI bleed 2019   hospitalized at [facility] for one week   Headache    since childhood   Low blood sugar    since childhood, controlled by diet   Mitral regurgitation    Mitral valve prolapse    Osteopenia 2021   Paroxysmal atrial fibrillation (HCC)    Seizure-like activity (HCC)    Right gaze preference at baseline, essential termor at baseline, intermittent reduced  responsiveness secondary to toxic/metabolic processes and poor cognitive reserve   Tremor    Significant Hospital Events: Including procedures, antibiotic start and stop dates in addition to other pertinent events   2/21 Admit to PCCM. 2/22 CT Head NAICA, generalized atrophy. STAT EEG without seizure activity. Persistent Parkinsonian tremoring of BUE. Urinary retention with UA c/f persistent infection. PEG site erythematous, indurated and with pus present. CT A/P demonstrating air-fluid levels in distended colon, ?ileus vs. Volvulus, bibasilar airspace opacities, PEG in second portion of duodenum, numerous thoracic compression fx, multiple nonobstructing renal stones, bladder outlet obstruction (chronic). Keppra stopped, LTM EEG to r/o seizure. Mero -> Zosyn in the setting of seizure risk. 2/23 Low grade fever, tachy with ?Afib RVR to 140s; resolved with metop '5mg'$  x 1. PEG with dark brown ?feculent discharge. IR consulted for eval/exchange. Mica added for fungal coverage; ID consulted. LTM EEG ongoing. 2/24 asked gensx and GI to eval possible underlying sig vol.   Interim History / Subjective:  No overnight issues Remain afebrile KUB showed some colonic distention and no volvulus Continue to have copious amount of secretions via ET tube  Objective:  Blood pressure 137/77, pulse 74, temperature 99 F (37.2 C), temperature source Axillary, resp. rate 20, height '5\' 10"'$  (1.778 m), weight 56.2 kg, SpO2 100 %.    Vent Mode: PCV FiO2 (%):  [40 %] 40 % Set Rate:  [20 bmp] 20 bmp PEEP:  [5 cmH20] 5 cmH20 Plateau Pressure:  [26 cmH20-32 cmH20] 30 cmH20   Intake/Output Summary (Last 24 hours) at 03/17/2022 0758 Last data filed at 03/17/2022 0600  Gross per 24 hour  Intake 1666.72 ml  Output 1450 ml  Net 216.72 ml   Filed Weights   03/14/22 0500 03/15/22 0500 03/16/22 1258  Weight: 58.1 kg 55.9 kg 56.2 kg   Physical Examination: General: Crtitically ill-appearing male, s/p trach HEENT: [place]/AT,  eyes anicteric.  Moist mucous membranes Neuro: Awake, following commands, continues to have resting tremors, he does have intermittent right gaze deviation Chest: Bilateral coarse crackles, no wheezes or rhonchi Heart: Regular rate and rhythm, no murmurs or gallops Abdomen: Soft, nondistended, bowel sounds present, PEG tube in place Skin: No rash  Resolved Hospital Problem List:    Assessment & Plan:  Acute septic encephalopathy Epilepsy was ruled out Parkinson's disease Initial CT head was negative Remain on EEG for 3 days with no active seizures noted I think because of Parkinson he has resting tremors, which were considered as seizure Off antiepileptics per neurorecommendations He does have intermittent right gaze deviation without correlation of EEG Avoid sedation  Sepsis, due to recurrent bilateral multifocal pneumonia, now with Pseudomonas Acute urinary infection with polymicrobial Chronic bladder outlet obstruction Patient remain off vasopressor support Stop IV fluid Infectious disease following Continue IV antibiotic with meropenem and Zyvox Micafungin was stopped by ID  Ileus Volvulus was ruled out PEG tube migration/displacement Prior history of GI bleeding Appreciate IR, general surgery and GI input PEG tube was replaced Repeat abdominal CT showed ileus Valve events was ruled out Restart tube feeds  Chronic hypoxic respiratory failure s/p tracheostomy Vent dependent dependent Continue lung protective ventilation VAP prevention bundle in place Off sedation  Paroxysmal atrial fibrillation Remains in sinus rhythm Not on anticoagulation for stroke prophylaxis due to prior history of GI bleeding Continue telemetry monitoring  Hypothyroidism Continue levothyroxine  Hyponatremia Hypokalemia/hypophosphatemia, improved Closely monitor electrolytes and supplement  Best Practice: (right click and "Reselect all SmartList Selections" daily)   Diet/type: NPO  start tube feed DVT prophylaxis: prophylactic heparin  GI prophylaxis: PPI Lines: N/A Foley:  N/A Code Status:  full code Last date of multidisciplinary goals of care discussion : 2/26, full code   This patient is critically ill with multiple organ system failure which requires frequent high complexity decision making, assessment, support, evaluation, and titration of therapies. This was completed through the application of advanced monitoring technologies and extensive interpretation of multiple databases.  During this encounter critical care time was devoted to patient care services described in this note for 32 minutes.    Jacky Kindle, MD [facility] See Amion for pager If no response to pager, please call [phone] until 7pm After 7pm, Please call E-link [phone]

## 2022-03-17 NOTE — Progress Notes (Signed)
LTM EEG discontinued - no skin breakdown at unhook. [facility] notified 

## 2022-03-17 NOTE — Progress Notes (Addendum)
Pharmacy Electrolyte Replacement  Recent Labs:  Recent Labs    03/16/22 2005 03/17/22 0319  K 4.8  --   MG  --  1.9  PHOS  --  1.6*  CREATININE 0.47*  --     Low Critical Values (K </= 2.5, Phos </= 1, Mg </= 1) Present: None  MD Contacted: Yes  Keep Mg >2 per GI recs  Plan:  Magnesium sulfate 2 g IV x 1 30 mmol sodium phosphate

## 2022-03-17 NOTE — Progress Notes (Signed)
Patient communicating that his belly hurts after starting tube feeds and that he feels he needs to have BM but isn't able to. Communicated this to Dr. Tacy Learn and placed tube feeds on hold for the time being.

## 2022-03-17 NOTE — Progress Notes (Signed)
Austin Davis for Infectious Disease  Date of Admission:  03/12/2022           Reason for visit: Follow up on sepsis  Current antibiotics: Meropenem Linezolid  ASSESSMENT:    82 y.o. male admitted with:  Suspected sepsis of unclear source: Potential etiologies include pneumonia, G-tube infection, urinary source. History of multidrug-resistant bacteria Seizure-like activity Chronic respiratory failure/trach dependent Foley catheter placed in the setting of bladder outlet obstruction PEG tube dependent with concern for site infection  RECOMMENDATIONS:    Continue linezolid and meropenem for another day to complete 7 days of therapy Will sign off, please call as needed   Principal Problem:   Seizure ([place])    MEDICATIONS:    Scheduled Meds:  Chlorhexidine Gluconate Cloth  6 each Topical Daily   feeding supplement (PROSource TF20)  60 mL Per Tube Daily   heparin  5,000 Units Subcutaneous Q8H   levothyroxine  25 mcg Per Tube Q0600   melatonin  6 mg Per Tube QHS   mouth rinse  15 mL Mouth Rinse Q2H   pantoprazole (PROTONIX) IV  40 mg Intravenous Q12H   sertraline  50 mg Per Tube Daily   sodium chloride HYPERTONIC  4 mL Nebulization BID   Continuous Infusions:  sodium chloride Stopped (03/17/22 0546)   feeding supplement (OSMOLITE 1.5 CAL) 20 mL/hr at 03/17/22 1000   linezolid (ZYVOX) IV 300 mL/hr at 03/17/22 1000   meropenem (MERREM) IV Stopped (03/17/22 0616)   sodium phosphate 30 mmol in dextrose 5 % 250 mL infusion 30 mmol (03/17/22 1024)   PRN Meds:.acetaminophen (TYLENOL) oral liquid 160 mg/5 mL, docusate, fentaNYL (SUBLIMAZE) injection, polyethylene glycol  SUBJECTIVE:    No new issues noted overnight   Review of Systems  Unable to perform ROS: Medical condition      OBJECTIVE:   Blood pressure (!) 115/97, pulse 74, temperature 97.8 F (36.6 C), temperature source Axillary, resp. rate 17, height '5\' 10"'$  (1.778 m), weight 56.2 kg, SpO2 100  %. Body mass index is 17.78 kg/m.  Physical Exam Constitutional:      Comments: Chronically ill appearing man, lying in bed.   HENT:     Head: Normocephalic and atraumatic.  Eyes:     Extraocular Movements: Extraocular movements intact.     Conjunctiva/sclera: Conjunctivae normal.  Neck:     Comments: Trach in place. Abdominal:     General: There is no distension.     Palpations: Abdomen is soft.  Musculoskeletal:     Cervical back: Normal range of motion and neck supple.  Skin:    General: Skin is warm and dry.  Neurological:     Mental Status: Mental status is at baseline.      Lab Results: Lab Results  Component Value Date   WBC 11.9 (H) 03/14/2022   HGB 9.1 (L) 03/14/2022   HCT 28.6 (L) 03/14/2022   MCV 83.1 03/14/2022   PLT 421 (H) 03/14/2022    Lab Results  Component Value Date   NA 129 (L) 03/17/2022   K 4.8 03/16/2022   CO2 24 03/16/2022   GLUCOSE 61 (L) 03/16/2022   BUN <5 (L) 03/16/2022   CREATININE 0.47 (L) 03/16/2022   CALCIUM 8.0 (L) 03/16/2022   GFRNONAA >60 03/16/2022   GFRAA >60 02/17/2017    Lab Results  Component Value Date   ALT 42 03/14/2022   AST 26 03/14/2022   ALKPHOS 96 03/14/2022   BILITOT 0.8 03/14/2022  Component Value Date/Time   CRP 6.0 (H) 06/25/2021 1218       Component Value Date/Time   ESRSEDRATE 9 09/23/2020 1035     I have reviewed the micro and lab results in Epic.  Imaging: DG Abd 1 View  Result Date: 03/16/2022 CLINICAL DATA:  Ileus. EXAM: ABDOMEN - 1 VIEW COMPARISON:  Radiographs yesterday. CT 03/13/2022 FINDINGS: The contrast previously within small bowel is now seen within the colon. Mild colonic distension with air and contrast. No definite small bowel distension. Unchanged positioning of IVC filter. Grossly stable radiographic appearance of gastrostomy tube. IMPRESSION: The contrast previously within small bowel is now seen within the colon. Mild colonic distension with air and contrast, possible  colonic ileus. No significant small bowel distension. Electronically Signed   By: Keith Rake M.D.   On: 03/16/2022 12:25     Imaging independently reviewed in Epic.    Raynelle Highland for Infectious Disease [facility] [phone] pager 03/17/2022, 12:30 PM

## 2022-03-17 NOTE — Progress Notes (Signed)
Nutrition Follow-up  DOCUMENTATION CODES:   Severe malnutrition in context of chronic illness, Underweight  INTERVENTION:   Tube Feeding via newly replaced 24 Fr. PEG: Osmolite 1.5 to 60 ml/hr Begin TF at rate of 20 ml/hr, titrate by 10 mL q 8 hours until goal rate of 60 ml/hr This provides 2160 kcals, 90 g of protein and 1094 mL of free water  Monitor electrolytes closely; continue supplementation as needed   Consider Juven BID once TF titrated to goal rate and pt demonstrating tolerance, each packet provides 80 calories, 8 grams of carbohydrate, 2.5  grams of protein (collagen), 7 grams of L-arginine and 7 grams of L-glutamine; supplement contains CaHMB, Vitamins C, E, B12 and Zinc to promote wound healing  Monitor for leakage around PEG tube insertion site  NUTRITION DIAGNOSIS:   Severe Malnutrition related to chronic illness as evidenced by severe fat depletion, severe muscle depletion.  Being addressed via TF   GOAL:   Provide needs based on ASPEN/SCCM guidelines  Progressing  MONITOR:   TF tolerance  REASON FOR ASSESSMENT:   Ventilator    ASSESSMENT:   82 y.o. male admits from [facility] related to seizure-like activity. PMH includes: CHF, CAD, essential tremor, GI bleed, mitral regurgitation, mitral valve prolapse, osteopenia, paroxysmal afib.  2/21 Admit 2/22 STAT EEG without seizure activity, PEG site erythematous, indurated with pus, CT abd ?ileus vs volvulus, PEG in 2nd portion of duodenum 2/23 PEG with dark brown, ?feculent drainage. IR and ID consulted 2/24 IR exchanged current tube PEG for 24 Fr balloon retention gastrostomy, no feculent output-looks like dark dried blood 2/25 Per GI, ok to start TF via PEG 2/26 TF initiated  Pt awake on vent via trach, appears to try to communicate with RD  EEG ongoing-no evidence of seizures, +Tremors (hx of essential tremor)  Hypoglycemic while NPO.  +multiple BMs. Per GI, "clinical picture and abd xray not  compatible with volvulus." Per GI note from yesterday, ok to start feeding via PEG tube. TF initiated this AM  RN reports no leaking from PEG tube today and report site looks ok. TF starting today, Osmolite 1.5 at 20 ml/hr with titration orders  Admit wt 52.4 kg, current wt 56.2 kg. Noted significant edema in BLE  Labs: phosphorus 1.6 (L), sodium 129 (L) Meds: sodium phosphate    Diet Order:   Diet Order             Diet NPO time specified  Diet effective now                   EDUCATION NEEDS:   Not appropriate for education at this time  Skin:  Skin Assessment: Skin Integrity Issues: Skin Integrity Issues:: Stage II Stage II: Left buttocks  Last BM:  2/24  Height:   Ht Readings from Last 1 Encounters:  03/12/22 '5\' 10"'$  (1.778 m)    Weight:   Wt Readings from Last 1 Encounters:  03/17/22 56.2 kg    BMI:  Body mass index is 17.78 kg/m.  Estimated Nutritional Needs:   Kcal:  [id number] kcals  Protein:  90-105 gm  Fluid:  >/= 1.8 L   Kerman Passey MS, RDN, LDN, CNSC Registered Dietitian 3 Clinical Nutrition RD Pager and On-Call Pager Number Located in [place]

## 2022-03-18 LAB — BASIC METABOLIC PANEL
Anion gap: 10 (ref 5–15)
BUN: 5 mg/dL — ABNORMAL LOW (ref 8–23)
CO2: 28 mmol/L (ref 22–32)
Calcium: 7.5 mg/dL — ABNORMAL LOW (ref 8.9–10.3)
Chloride: 90 mmol/L — ABNORMAL LOW (ref 98–111)
Creatinine, Ser: <0.3 mg/dL — ABNORMAL LOW (ref 0.61–1.24)
Glucose, Bld: 154 mg/dL — ABNORMAL HIGH (ref 70–99)
Potassium: 3 mmol/L — ABNORMAL LOW (ref 3.5–5.1)
Sodium: 128 mmol/L — ABNORMAL LOW (ref 135–145)

## 2022-03-18 LAB — CULTURE, BLOOD (ROUTINE X 2): Culture: NO GROWTH

## 2022-03-18 LAB — GLUCOSE, CAPILLARY
Glucose-Capillary: 152 mg/dL — ABNORMAL HIGH (ref 70–99)
Glucose-Capillary: 155 mg/dL — ABNORMAL HIGH (ref 70–99)
Glucose-Capillary: 188 mg/dL — ABNORMAL HIGH (ref 70–99)
Glucose-Capillary: 136 mg/dL — ABNORMAL HIGH (ref 70–99)

## 2022-03-18 LAB — MAGNESIUM: Magnesium: 2.1 mg/dL (ref 1.7–2.4)

## 2022-03-18 MED ORDER — FUROSEMIDE 10 MG/ML IJ SOLN
40.0000 mg | Freq: Once | INTRAMUSCULAR | Status: AC
  Administered 2022-03-18: 40 mg via INTRAVENOUS
  Filled 2022-03-18: qty 4

## 2022-03-18 MED ORDER — SODIUM CHLORIDE 0.9 % IV SOLN: 1.0000 g | Freq: Three times a day (TID) | INTRAVENOUS | 0 refills | Status: AC

## 2022-03-18 MED ORDER — POTASSIUM CHLORIDE 20 MEQ PO PACK: 20.0000 meq | PACK | Freq: Once | ORAL | Status: DC

## 2022-03-18 MED ORDER — POTASSIUM CHLORIDE 20 MEQ PO PACK
40.0000 meq | PACK | Freq: Four times a day (QID) | ORAL | Status: AC
  Administered 2022-03-18 (×2): 40 meq
  Filled 2022-03-18 (×2): qty 2

## 2022-03-18 MED ORDER — SCOPOLAMINE 1 MG/3DAYS TD PT72
1.0000 | MEDICATED_PATCH | TRANSDERMAL | Status: DC
  Administered 2022-03-18: 1.5 mg via TRANSDERMAL
  Filled 2022-03-18: qty 1

## 2022-03-18 NOTE — TOC Transition Note (Signed)
Transition of Care [facility]) - CM/SW Discharge Note   Patient Details  Name: Austin Davis MRN: [id number] Date of Birth: 1940/03/07  Transition of Care [facility]) CM/SW Contact:  Benard Halsted, LCSW Phone Number: 03/18/2022, 12:55 PM   Clinical Narrative:    Patient will DC to: [facility] Anticipated DC date: 03/18/22 Family notified: Left vm for daughter Ernestene Kiel by: RN to call [facility]   Per MD patient ready for DC to [facility]. RN to call report prior to discharge [id number] bed [id number], receiving MD of Betti Cruz. RN to contact [facility] at [phone] when ready for pickup). RN, patient, patient's family, and facility notified of DC. Discharge Summary sent to facility. DC packet on chart.   CSW will sign off for now as social work intervention is no longer needed. Please consult [place] again if new needs arise.     Final next level of care: Skilled Nursing Facility Barriers to Discharge: Barriers Resolved   Patient Goals and CMS Choice CMS Medicare.gov Compare Post Acute Care list provided to:: Patient Represenative (must comment) Choice offered to / list presented to : Adult Children  Discharge Placement     Existing PASRR number confirmed : 03/18/22          Patient chooses bed at:  ([facility]) Patient to be transferred to facility by: [place] Name of family member notified: [name] Patient and family notified of of transfer: 03/18/22  Discharge Plan and Services Additional resources added to the After Visit Summary for   In-house Referral: Clinical Social Work   Post Acute Care Choice: [place]                               Social Determinants of Health (SDOH) Interventions SDOH Screenings   Alcohol Screen: Low Risk  (04/12/2021)  Tobacco Use: Low Risk  (03/16/2022)     Readmission Risk Interventions    03/18/2022   11:45 AM 03/03/2022   12:04 PM 02/07/2022   11:58 AM  Readmission Risk Prevention Plan   Transportation Screening Complete Complete Complete  Medication Review Press photographer) Complete Complete   PCP or Specialist appointment within 3-5 days of discharge Complete Complete   HRI or Home Care Consult Complete Complete   SW Recovery Care/Counseling Consult Complete Complete Complete  Palliative Care Screening Not Applicable Not Applicable   Skilled Nursing Facility Complete Complete Complete

## 2022-03-18 NOTE — Progress Notes (Signed)
[facility] on unit to transport pt to [facility] report given all questions answered. Pt is stable. IV left in place. [facility] given discharge packet and all belongings.   Report called to [facility]. All questions answered.

## 2022-03-18 NOTE — Progress Notes (Incomplete)
Introduction: 72 YOM presented to [facility] 2/21 from [facility] for possible seizure activity  History of present illness: Pt has a history of multiple admission to [facility] with recurrent sepsis and infection with MDR organisms. (ESBL pseudomonas, klebsiella, enterococcus). Admitted 2/17-2/12 with encephalopathy/seizures, PsA VAP and ESBL E Coli UTI, treated with linezolid and meropenem. 2/21 seizure activity at [facility], CT head negative, loaded with Keppra and treated empirically for meningitis with acyclovir, meropenem and vancomycin.   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), OSA, GIB (2019)  PTA meds: , folic acid '1mg'$ , lactobacillus, levetiracetam 1000 BID (stop per neuro), , , midodrine 10 TID (BP high), MVT, fish oil, Miralax qoday, , tamsulosin 0.4, valproic acid 250 BID (stop per neuro)  PRN meds: furosemide '20mg'$  prn edema, alprazolam 0.'5mg'$  q8h prn anxiety, flonase prn, hydroxyzine '25mg'$  q6h prn anxiety, lactulose q12hprn constipation, metop tart 25 BID prn Afib, propranolol 10 prn tremor   Social hx:  Physical exam/relevant labs, diagnostic testing/imaging:  Per ID note continue abx today to complete 7 days but they are signing off, afternoon belly hurts after starting feeds like he has to have BM but isnt able to, tube feeds put on hold but restarted  Problem based assessment and plan:  - Problem 1: Sepsis  A. Assessment: Due to recurrent pneumonia and recurrent UTI. PT has a history of MDR UTI and pneumonia. He was empirically treated for meningitis on arrival with acyclovir, mropenem and vancomycin. Meropenem was changed to Zosyn based on susceptibilities in prior culture data and vancomycin was changed to linezolid due to history of VRE. Acyclovir was dropped. Micafungin was added for fungal coverage and ID was consulted due to multiple recent MDROs. ID  changed zosyn to meropnem based on prior cultures regardless of increased seizure risk. 2/25 micafungin stopped due to NG on Bcx. Tmax 100, WBC 11.9 2/23, per ID note 2/26 today 2/27 will be last day of antibiotics for a total of 7 days. Bcx NG x 5 days, Urine culture 80k E faecalis R vancomycin, 60k Enterobacter aerogenes R CRO, Zosyn, I Nitrofruantoin, 10k PsA pan sensitive, respiratory cx moderate psA pan sensitive. Off vasopressor support and no IV fluids  B. Plan: Stop date in for abx after last dose tonight   - Problem 2: Seizures/parkinsons  A. Assessment: Brought in possible seizure activity and AMS where CTH was negative, he was loaded with Keppra and home Keppra and valproic acid was restarted. EEG for 3 days with no active seizures noted, believed to have resting tremors which were cosnidered seizure. Per nuerology recommendations no long on any antiepileptics was on Keppra and valproic acid.  B. Plan: monitor for seizure activity   - Problem 3: Ileus  A. Assessment: Abdominal CT showed possible ileus confirmed on repeat, volvulus was ruled out, PEG tube was exchanged and tube feeds have been restarted per GI recs, surgery did not recommend any intervention, having bowel movements, continue to monitor   B. Plan:   Supportive: monitoring electrolytes and repleting as necesssary d/t diuresis with furosemide, giving 100 mEq of K as 3.0 and getting more lasix today, Mg normal  having trouble with secretions, started on scopolamine. Not on any afib AC due to hx of GI bleed, has bowel movement yesterday   F- feeds via PEG A- APAP and fent PRN S- none T- heparin [place] H-  U- Pantoprazole 40 BID  G- 110-150, no insulin B-  LBM 2/25, docusate and miralax PRN  I- catheter D- today last day of abx, stop dates entered

## 2022-03-18 NOTE — TOC Initial Note (Signed)
Transition of Care [facility]) - Initial/Assessment Note    Patient Details  Name: Austin Davis MRN: [id number] Date of Birth: 02/11/1940  Transition of Care [facility]) CM/SW Contact:    Benard Halsted, LCSW Phone Number: 03/18/2022, 11:46 AM  Clinical Narrative:                 Per MD, patient ready to return to [facility] today. CSW spoke with Angie there who confirmed they can accept patient today. CSW left voicemail for patient's daughter, [name], to make her aware. Patient will require [facility] for transport. Medical Necessity paperwork placed in DC packet on hard chart.  Expected Discharge Plan: Skilled Nursing Facility Barriers to Discharge: Barriers Resolved   Patient Goals and CMS Choice Patient states their goals for this hospitalization and ongoing recovery are:: Return to SNF-intubated CMS Medicare.gov Compare Post Acute Care list provided to:: Patient Represenative (must comment) Choice offered to / list presented to : Adult [place] ownership interest in [facility].provided to:: Adult Children    Expected Discharge Plan and Services In-house Referral: Clinical Social Work   Post Acute Care Choice: [place] Living arrangements for the past 2 months: [place]                                      Prior Living Arrangements/Services Living arrangements for the past 2 months: [place] Lives with:: Facility Resident Patient language and need for interpreter reviewed:: Yes Do you feel safe going back to the place where you live?: Yes      Need for Family Participation in Patient Care: Yes (Comment) Care giver support system in place?: No (comment)   Criminal Activity/Legal Involvement Pertinent to Current Situation/Hospitalization: No - Comment as needed  Activities of Daily Living      Permission Sought/Granted Permission sought to share information with : Facility Automotive engineer, Family Supports Permission granted to share information with : No  Share Information with NAME: [name]  Permission granted to share info w AGENCY: [facility]  Permission granted to share info w Relationship: Daughter  Permission granted to share info w Contact Information: [name] [phone]  Emotional Assessment Appearance:: Appears stated age Attitude/Demeanor/Rapport: Unable to Assess Affect (typically observed): Unable to Assess Orientation: :  (follows commands) Alcohol / Substance Use: Not Applicable Psych Involvement: No (comment)  Admission diagnosis:  Seizure ([place]) [R56.9] Sepsis, due to unspecified organism, unspecified whether acute organ dysfunction present [facility]) [A41.9] Patient Active Problem List   Diagnosis Date Noted   PEG tube malfunction ([place]) 03/17/2022   Urinary tract infection without hematuria 03/17/2022   Acute hypoxic respiratory failure ([place]) 02/26/2022   Multiple drug resistant organism (MDRO) culture positive 02/04/2022   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   HCAP (healthcare-associated pneumonia) 02/01/2022   Acute respiratory failure with hypoxia ([place]) 01/05/2022   Hyperkalemia 01/04/2022   Encephalopathy acute 01/04/2022   Seizure ([place]) 01/04/2022   Acute on chronic respiratory failure ([place]) 10/26/2021   Seizures ([place]) 06/26/2021   Tremor    Sepsis ([place]) 06/25/2021   Cardiac arrest due to respiratory disorder ([place]) 06/07/2021   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   Impaired quality of life 05/30/2021   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 05/15/2021   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[place]) 05/08/2021   Pneumonia 05/08/2021   Urinary tract infection  associated with indwelling urethral catheter ([place]) 05/08/2021   Encephalopathy 05/08/2021   Pressure injury of skin 04/30/2021   Seizure disorder ([place]) 04/29/2021   Pneumothorax 04/11/2021   Ventilator dependent ([place])     Aspiration pneumonia ([place]) 03/25/2021   Parkinsonism 03/25/2021   Obstructive sleep apnea 10/24/2020   Chronic obstructive pulmonary disease ([place]) 10/24/2020   Chronic heart failure with preserved ejection fraction ([place]) 10/24/2020   Unintentional weight loss 09/20/2020   SVT (supraventricular tachycardia) 09/20/2020   Protein-calorie malnutrition, severe 09/17/2020   Acute on chronic respiratory failure with hypoxia and hypercapnia ([place]) 09/16/2020   Failure to thrive in adult 09/16/2020   Stage 1 skin ulcer of sacral region ([place]) 09/16/2020   Chronic hypotension 06/30/2020   History of GI bleed 02/17/2017   Rectal bleeding 02/16/2017   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   Essential tremor 02/19/2015   Status post mitral valve annuloplasty and MAZE 2005 12/12/2014   HLD (hyperlipidemia) 05/05/2013   DOE (dyspnea on exertion) 04/22/2013   Fatigue 04/22/2013   Paroxysmal atrial fibrillation ([place]) 05/08/2012   PCP:  Aaron Edelman, MD Pharmacy:   [facility] [address] [place] [address] Phone: [phone] Fax: [phone]     Social Determinants of Health (SDOH) Social History: SDOH Screenings   Alcohol Screen: Low Risk  (04/12/2021)  Tobacco Use: Low Risk  (03/16/2022)   SDOH Interventions:     Readmission Risk Interventions    03/18/2022   11:45 AM 03/03/2022   12:04 PM 02/07/2022   11:58 AM  Readmission Risk Prevention Plan  Transportation Screening Complete Complete Complete  Medication Review Press photographer) Complete Complete   PCP or Specialist appointment within 3-5 days of discharge Complete Complete   HRI or Home Care Consult Complete Complete   SW Recovery Care/Counseling Consult Complete Complete Complete  Palliative Care Screening Not Applicable Not Applicable   Skilled Nursing Facility Complete Complete Complete

## 2022-03-19 ENCOUNTER — Encounter (HOSPITAL_COMMUNITY): Payer: Self-pay | Admitting: Radiology

## 2022-03-23 DIAGNOSIS — J9621 Acute and chronic respiratory failure with hypoxia: Secondary | ICD-10-CM | POA: Diagnosis not present

## 2022-03-23 LAB — URINE CULTURE: Culture: 50000 — AB

## 2022-03-31 DIAGNOSIS — Z430 Encounter for attention to tracheostomy: Secondary | ICD-10-CM | POA: Diagnosis not present

## 2022-03-31 DIAGNOSIS — J449 Chronic obstructive pulmonary disease, unspecified: Secondary | ICD-10-CM | POA: Diagnosis not present

## 2022-03-31 DIAGNOSIS — R1319 Other dysphagia: Secondary | ICD-10-CM | POA: Diagnosis not present

## 2022-03-31 DIAGNOSIS — I480 Paroxysmal atrial fibrillation: Secondary | ICD-10-CM | POA: Diagnosis not present

## 2022-03-31 DIAGNOSIS — Z431 Encounter for attention to gastrostomy: Secondary | ICD-10-CM | POA: Diagnosis not present

## 2022-03-31 DIAGNOSIS — G4089 Other seizures: Secondary | ICD-10-CM | POA: Diagnosis not present

## 2022-03-31 DIAGNOSIS — N390 Urinary tract infection, site not specified: Secondary | ICD-10-CM | POA: Diagnosis not present

## 2022-03-31 DIAGNOSIS — I5023 Acute on chronic systolic (congestive) heart failure: Secondary | ICD-10-CM | POA: Diagnosis not present

## 2022-03-31 DIAGNOSIS — G250 Essential tremor: Secondary | ICD-10-CM | POA: Diagnosis not present

## 2022-03-31 DIAGNOSIS — J9621 Acute and chronic respiratory failure with hypoxia: Secondary | ICD-10-CM | POA: Diagnosis not present

## 2022-03-31 DIAGNOSIS — Z9911 Dependence on respirator [ventilator] status: Secondary | ICD-10-CM | POA: Diagnosis not present

## 2022-04-01 DIAGNOSIS — Z9911 Dependence on respirator [ventilator] status: Secondary | ICD-10-CM | POA: Diagnosis not present

## 2022-04-01 DIAGNOSIS — G4089 Other seizures: Secondary | ICD-10-CM | POA: Diagnosis not present

## 2022-04-01 DIAGNOSIS — R1319 Other dysphagia: Secondary | ICD-10-CM | POA: Diagnosis not present

## 2022-04-01 DIAGNOSIS — Z430 Encounter for attention to tracheostomy: Secondary | ICD-10-CM | POA: Diagnosis not present

## 2022-04-01 DIAGNOSIS — J9621 Acute and chronic respiratory failure with hypoxia: Secondary | ICD-10-CM | POA: Diagnosis not present

## 2022-04-01 DIAGNOSIS — Z431 Encounter for attention to gastrostomy: Secondary | ICD-10-CM | POA: Diagnosis not present

## 2022-04-04 DIAGNOSIS — J9621 Acute and chronic respiratory failure with hypoxia: Secondary | ICD-10-CM | POA: Diagnosis not present

## 2022-04-07 DIAGNOSIS — J9621 Acute and chronic respiratory failure with hypoxia: Secondary | ICD-10-CM | POA: Diagnosis not present

## 2022-04-08 DIAGNOSIS — J9621 Acute and chronic respiratory failure with hypoxia: Secondary | ICD-10-CM | POA: Diagnosis not present

## 2022-04-08 DIAGNOSIS — Z9911 Dependence on respirator [ventilator] status: Secondary | ICD-10-CM | POA: Diagnosis not present

## 2022-04-08 DIAGNOSIS — Z430 Encounter for attention to tracheostomy: Secondary | ICD-10-CM | POA: Diagnosis not present

## 2022-04-08 DIAGNOSIS — G4089 Other seizures: Secondary | ICD-10-CM | POA: Diagnosis not present

## 2022-04-08 DIAGNOSIS — Z431 Encounter for attention to gastrostomy: Secondary | ICD-10-CM | POA: Diagnosis not present

## 2022-04-08 DIAGNOSIS — R1319 Other dysphagia: Secondary | ICD-10-CM | POA: Diagnosis not present

## 2022-04-11 DIAGNOSIS — R1319 Other dysphagia: Secondary | ICD-10-CM | POA: Diagnosis not present

## 2022-04-11 DIAGNOSIS — Z9911 Dependence on respirator [ventilator] status: Secondary | ICD-10-CM | POA: Diagnosis not present

## 2022-04-11 DIAGNOSIS — Z431 Encounter for attention to gastrostomy: Secondary | ICD-10-CM | POA: Diagnosis not present

## 2022-04-11 DIAGNOSIS — J9621 Acute and chronic respiratory failure with hypoxia: Secondary | ICD-10-CM | POA: Diagnosis not present

## 2022-04-11 DIAGNOSIS — G4089 Other seizures: Secondary | ICD-10-CM | POA: Diagnosis not present

## 2022-04-11 DIAGNOSIS — Z430 Encounter for attention to tracheostomy: Secondary | ICD-10-CM | POA: Diagnosis not present

## 2022-04-15 DIAGNOSIS — G4089 Other seizures: Secondary | ICD-10-CM | POA: Diagnosis not present

## 2022-04-15 DIAGNOSIS — Z430 Encounter for attention to tracheostomy: Secondary | ICD-10-CM | POA: Diagnosis not present

## 2022-04-15 DIAGNOSIS — J9621 Acute and chronic respiratory failure with hypoxia: Secondary | ICD-10-CM | POA: Diagnosis not present

## 2022-04-15 DIAGNOSIS — Z9911 Dependence on respirator [ventilator] status: Secondary | ICD-10-CM | POA: Diagnosis not present

## 2022-04-15 DIAGNOSIS — Z431 Encounter for attention to gastrostomy: Secondary | ICD-10-CM | POA: Diagnosis not present

## 2022-04-15 DIAGNOSIS — R1319 Other dysphagia: Secondary | ICD-10-CM | POA: Diagnosis not present

## 2022-04-16 DIAGNOSIS — Z430 Encounter for attention to tracheostomy: Secondary | ICD-10-CM | POA: Diagnosis not present

## 2022-04-16 DIAGNOSIS — Z431 Encounter for attention to gastrostomy: Secondary | ICD-10-CM | POA: Diagnosis not present

## 2022-04-16 DIAGNOSIS — R1319 Other dysphagia: Secondary | ICD-10-CM | POA: Diagnosis not present

## 2022-04-16 DIAGNOSIS — J9621 Acute and chronic respiratory failure with hypoxia: Secondary | ICD-10-CM | POA: Diagnosis not present

## 2022-04-16 DIAGNOSIS — Z9911 Dependence on respirator [ventilator] status: Secondary | ICD-10-CM | POA: Diagnosis not present

## 2022-04-16 DIAGNOSIS — G4089 Other seizures: Secondary | ICD-10-CM | POA: Diagnosis not present

## 2022-04-17 DIAGNOSIS — J9621 Acute and chronic respiratory failure with hypoxia: Secondary | ICD-10-CM | POA: Diagnosis not present

## 2022-04-18 DIAGNOSIS — R0989 Other specified symptoms and signs involving the circulatory and respiratory systems: Secondary | ICD-10-CM | POA: Diagnosis not present

## 2022-04-21 DIAGNOSIS — J9621 Acute and chronic respiratory failure with hypoxia: Secondary | ICD-10-CM | POA: Diagnosis not present

## 2022-04-22 DIAGNOSIS — G4089 Other seizures: Secondary | ICD-10-CM | POA: Diagnosis not present

## 2022-04-22 DIAGNOSIS — N390 Urinary tract infection, site not specified: Secondary | ICD-10-CM | POA: Diagnosis not present

## 2022-04-22 DIAGNOSIS — R1319 Other dysphagia: Secondary | ICD-10-CM | POA: Diagnosis not present

## 2022-04-22 DIAGNOSIS — J449 Chronic obstructive pulmonary disease, unspecified: Secondary | ICD-10-CM | POA: Diagnosis not present

## 2022-04-22 DIAGNOSIS — J9621 Acute and chronic respiratory failure with hypoxia: Secondary | ICD-10-CM | POA: Diagnosis not present

## 2022-04-22 DIAGNOSIS — G250 Essential tremor: Secondary | ICD-10-CM | POA: Diagnosis not present

## 2022-04-22 DIAGNOSIS — I480 Paroxysmal atrial fibrillation: Secondary | ICD-10-CM | POA: Diagnosis not present

## 2022-04-22 DIAGNOSIS — Z431 Encounter for attention to gastrostomy: Secondary | ICD-10-CM | POA: Diagnosis not present

## 2022-04-22 DIAGNOSIS — Z430 Encounter for attention to tracheostomy: Secondary | ICD-10-CM | POA: Diagnosis not present

## 2022-04-22 DIAGNOSIS — I5023 Acute on chronic systolic (congestive) heart failure: Secondary | ICD-10-CM | POA: Diagnosis not present

## 2022-04-22 DIAGNOSIS — Z9911 Dependence on respirator [ventilator] status: Secondary | ICD-10-CM | POA: Diagnosis not present

## 2022-04-23 DIAGNOSIS — G4089 Other seizures: Secondary | ICD-10-CM | POA: Diagnosis not present

## 2022-04-23 DIAGNOSIS — Z9911 Dependence on respirator [ventilator] status: Secondary | ICD-10-CM | POA: Diagnosis not present

## 2022-04-23 DIAGNOSIS — R1319 Other dysphagia: Secondary | ICD-10-CM | POA: Diagnosis not present

## 2022-04-23 DIAGNOSIS — Z431 Encounter for attention to gastrostomy: Secondary | ICD-10-CM | POA: Diagnosis not present

## 2022-04-23 DIAGNOSIS — J9621 Acute and chronic respiratory failure with hypoxia: Secondary | ICD-10-CM | POA: Diagnosis not present

## 2022-04-23 DIAGNOSIS — Z430 Encounter for attention to tracheostomy: Secondary | ICD-10-CM | POA: Diagnosis not present

## 2022-04-29 DIAGNOSIS — R1319 Other dysphagia: Secondary | ICD-10-CM | POA: Diagnosis not present

## 2022-04-29 DIAGNOSIS — J9621 Acute and chronic respiratory failure with hypoxia: Secondary | ICD-10-CM | POA: Diagnosis not present

## 2022-04-29 DIAGNOSIS — Z430 Encounter for attention to tracheostomy: Secondary | ICD-10-CM | POA: Diagnosis not present

## 2022-04-29 DIAGNOSIS — Z9911 Dependence on respirator [ventilator] status: Secondary | ICD-10-CM | POA: Diagnosis not present

## 2022-04-29 DIAGNOSIS — Z431 Encounter for attention to gastrostomy: Secondary | ICD-10-CM | POA: Diagnosis not present

## 2022-04-29 DIAGNOSIS — G4089 Other seizures: Secondary | ICD-10-CM | POA: Diagnosis not present

## 2022-05-01 DIAGNOSIS — J9621 Acute and chronic respiratory failure with hypoxia: Secondary | ICD-10-CM | POA: Diagnosis not present

## 2022-05-02 DIAGNOSIS — R000 Tachycardia, unspecified: Secondary | ICD-10-CM

## 2022-05-06 DIAGNOSIS — R1319 Other dysphagia: Secondary | ICD-10-CM | POA: Diagnosis not present

## 2022-05-06 DIAGNOSIS — J9621 Acute and chronic respiratory failure with hypoxia: Secondary | ICD-10-CM | POA: Diagnosis not present

## 2022-05-06 DIAGNOSIS — Z9911 Dependence on respirator [ventilator] status: Secondary | ICD-10-CM | POA: Diagnosis not present

## 2022-05-06 DIAGNOSIS — G4089 Other seizures: Secondary | ICD-10-CM | POA: Diagnosis not present

## 2022-05-06 DIAGNOSIS — Z431 Encounter for attention to gastrostomy: Secondary | ICD-10-CM | POA: Diagnosis not present

## 2022-05-06 DIAGNOSIS — Z430 Encounter for attention to tracheostomy: Secondary | ICD-10-CM | POA: Diagnosis not present

## 2022-05-11 DIAGNOSIS — Z9911 Dependence on respirator [ventilator] status: Secondary | ICD-10-CM | POA: Diagnosis not present

## 2022-05-11 DIAGNOSIS — R1319 Other dysphagia: Secondary | ICD-10-CM | POA: Diagnosis not present

## 2022-05-11 DIAGNOSIS — Z431 Encounter for attention to gastrostomy: Secondary | ICD-10-CM | POA: Diagnosis not present

## 2022-05-11 DIAGNOSIS — J9621 Acute and chronic respiratory failure with hypoxia: Secondary | ICD-10-CM | POA: Diagnosis not present

## 2022-05-11 DIAGNOSIS — Z430 Encounter for attention to tracheostomy: Secondary | ICD-10-CM | POA: Diagnosis not present

## 2022-05-11 DIAGNOSIS — G4089 Other seizures: Secondary | ICD-10-CM | POA: Diagnosis not present

## 2022-05-12 DIAGNOSIS — Z431 Encounter for attention to gastrostomy: Secondary | ICD-10-CM | POA: Diagnosis not present

## 2022-05-12 DIAGNOSIS — Z9911 Dependence on respirator [ventilator] status: Secondary | ICD-10-CM | POA: Diagnosis not present

## 2022-05-12 DIAGNOSIS — G4089 Other seizures: Secondary | ICD-10-CM | POA: Diagnosis not present

## 2022-05-12 DIAGNOSIS — R1319 Other dysphagia: Secondary | ICD-10-CM | POA: Diagnosis not present

## 2022-05-12 DIAGNOSIS — Z430 Encounter for attention to tracheostomy: Secondary | ICD-10-CM | POA: Diagnosis not present

## 2022-05-12 DIAGNOSIS — J9621 Acute and chronic respiratory failure with hypoxia: Secondary | ICD-10-CM | POA: Diagnosis not present

## 2022-05-13 DIAGNOSIS — J9621 Acute and chronic respiratory failure with hypoxia: Secondary | ICD-10-CM | POA: Diagnosis not present

## 2022-05-13 DIAGNOSIS — G4089 Other seizures: Secondary | ICD-10-CM | POA: Diagnosis not present

## 2022-05-13 DIAGNOSIS — Z430 Encounter for attention to tracheostomy: Secondary | ICD-10-CM | POA: Diagnosis not present

## 2022-05-13 DIAGNOSIS — R1319 Other dysphagia: Secondary | ICD-10-CM | POA: Diagnosis not present

## 2022-05-13 DIAGNOSIS — Z9911 Dependence on respirator [ventilator] status: Secondary | ICD-10-CM | POA: Diagnosis not present

## 2022-05-13 DIAGNOSIS — Z431 Encounter for attention to gastrostomy: Secondary | ICD-10-CM | POA: Diagnosis not present

## 2022-05-15 DIAGNOSIS — J9621 Acute and chronic respiratory failure with hypoxia: Secondary | ICD-10-CM | POA: Diagnosis not present

## 2022-05-19 ENCOUNTER — Other Ambulatory Visit: Payer: Self-pay

## 2022-05-19 ENCOUNTER — Emergency Department (HOSPITAL_COMMUNITY)
Admission: EM | Admit: 2022-05-19 | Discharge: 2022-05-19 | Disposition: A | Discharge status: Other Acute Inpatient Hospital | Payer: Medicare Other | Attending: Emergency Medicine | Admitting: Emergency Medicine

## 2022-05-19 ENCOUNTER — Encounter (HOSPITAL_COMMUNITY): Payer: Self-pay

## 2022-05-19 ENCOUNTER — Emergency Department (HOSPITAL_COMMUNITY): Payer: Medicare Other

## 2022-05-19 DIAGNOSIS — I509 Heart failure, unspecified: Secondary | ICD-10-CM | POA: Insufficient documentation

## 2022-05-19 DIAGNOSIS — N390 Urinary tract infection, site not specified: Secondary | ICD-10-CM

## 2022-05-19 DIAGNOSIS — A419 Sepsis, unspecified organism: Secondary | ICD-10-CM | POA: Diagnosis not present

## 2022-05-19 DIAGNOSIS — J9612 Chronic respiratory failure with hypercapnia: Secondary | ICD-10-CM

## 2022-05-19 DIAGNOSIS — Z9911 Dependence on respirator [ventilator] status: Secondary | ICD-10-CM | POA: Diagnosis not present

## 2022-05-19 DIAGNOSIS — G4089 Other seizures: Secondary | ICD-10-CM | POA: Diagnosis not present

## 2022-05-19 DIAGNOSIS — R251 Tremor, unspecified: Secondary | ICD-10-CM | POA: Diagnosis not present

## 2022-05-19 DIAGNOSIS — R1319 Other dysphagia: Secondary | ICD-10-CM | POA: Diagnosis not present

## 2022-05-19 DIAGNOSIS — Z431 Encounter for attention to gastrostomy: Secondary | ICD-10-CM | POA: Diagnosis not present

## 2022-05-19 DIAGNOSIS — J9621 Acute and chronic respiratory failure with hypoxia: Secondary | ICD-10-CM | POA: Diagnosis not present

## 2022-05-19 DIAGNOSIS — G20C Parkinsonism, unspecified: Secondary | ICD-10-CM | POA: Diagnosis not present

## 2022-05-19 DIAGNOSIS — J189 Pneumonia, unspecified organism: Secondary | ICD-10-CM | POA: Diagnosis not present

## 2022-05-19 DIAGNOSIS — Z430 Encounter for attention to tracheostomy: Secondary | ICD-10-CM | POA: Diagnosis not present

## 2022-05-19 DIAGNOSIS — R4182 Altered mental status, unspecified: Secondary | ICD-10-CM | POA: Insufficient documentation

## 2022-05-19 DIAGNOSIS — R569 Unspecified convulsions: Secondary | ICD-10-CM | POA: Diagnosis not present

## 2022-05-19 DIAGNOSIS — R000 Tachycardia, unspecified: Secondary | ICD-10-CM | POA: Diagnosis not present

## 2022-05-19 LAB — CBC WITH DIFFERENTIAL/PLATELET
Abs Immature Granulocytes: 0.17 10*3/uL — ABNORMAL HIGH (ref 0.00–0.07)
Basophils Absolute: 0.1 10*3/uL (ref 0.0–0.1)
Basophils Relative: 0 %
Eosinophils Absolute: 0.2 10*3/uL (ref 0.0–0.5)
Eosinophils Relative: 1 %
HCT: 27.5 % — ABNORMAL LOW (ref 39.0–52.0)
Hemoglobin: 8 g/dL — ABNORMAL LOW (ref 13.0–17.0)
Immature Granulocytes: 1 %
Lymphocytes Relative: 2 %
Lymphs Abs: 0.5 10*3/uL — ABNORMAL LOW (ref 0.7–4.0)
MCH: 27.1 pg (ref 26.0–34.0)
MCHC: 29.1 g/dL — ABNORMAL LOW (ref 30.0–36.0)
MCV: 93.2 fL (ref 80.0–100.0)
Monocytes Absolute: 1 10*3/uL (ref 0.1–1.0)
Monocytes Relative: 5 %
Neutro Abs: 17.8 10*3/uL — ABNORMAL HIGH (ref 1.7–7.7)
Neutrophils Relative %: 91 %
Platelets: 424 10*3/uL — ABNORMAL HIGH (ref 150–400)
RBC: 2.95 MIL/uL — ABNORMAL LOW (ref 4.22–5.81)
RDW: 18.5 % — ABNORMAL HIGH (ref 11.5–15.5)
WBC: 19.7 10*3/uL — ABNORMAL HIGH (ref 4.0–10.5)
nRBC: 0 % (ref 0.0–0.2)

## 2022-05-19 LAB — URINALYSIS, W/ REFLEX TO CULTURE (INFECTION SUSPECTED)
Bilirubin Urine: NEGATIVE
Glucose, UA: NEGATIVE mg/dL
Ketones, ur: NEGATIVE mg/dL
Nitrite: NEGATIVE
Protein, ur: NEGATIVE mg/dL
Specific Gravity, Urine: 1.009 (ref 1.005–1.030)
WBC, UA: >50 WBC/hpf (ref 0–5)
pH: 6 (ref 5.0–8.0)

## 2022-05-19 LAB — I-STAT CHEM 8, ED
BUN: 29 mg/dL — ABNORMAL HIGH (ref 8–23)
Calcium, Ion: 1.07 mmol/L — ABNORMAL LOW (ref 1.15–1.40)
Chloride: 85 mmol/L — ABNORMAL LOW (ref 98–111)
Creatinine, Ser: 0.3 mg/dL — ABNORMAL LOW (ref 0.61–1.24)
Glucose, Bld: 138 mg/dL — ABNORMAL HIGH (ref 70–99)
HCT: 28 % — ABNORMAL LOW (ref 39.0–52.0)
Hemoglobin: 9.5 g/dL — ABNORMAL LOW (ref 13.0–17.0)
Potassium: 4.6 mmol/L (ref 3.5–5.1)
Sodium: 132 mmol/L — ABNORMAL LOW (ref 135–145)
TCO2: 44 mmol/L — ABNORMAL HIGH (ref 22–32)

## 2022-05-19 LAB — I-STAT VENOUS BLOOD GAS, ED
Acid-Base Excess: 20 mmol/L — ABNORMAL HIGH (ref 0.0–2.0)
Bicarbonate: 47.1 mmol/L — ABNORMAL HIGH (ref 20.0–28.0)
Calcium, Ion: 1.08 mmol/L — ABNORMAL LOW (ref 1.15–1.40)
HCT: 28 % — ABNORMAL LOW (ref 39.0–52.0)
Hemoglobin: 9.5 g/dL — ABNORMAL LOW (ref 13.0–17.0)
O2 Saturation: 65 %
Potassium: 4.4 mmol/L (ref 3.5–5.1)
Sodium: 135 mmol/L (ref 135–145)
TCO2: 49 mmol/L — ABNORMAL HIGH (ref 22–32)
pCO2, Ven: 71.3 mmHg (ref 44–60)
pH, Ven: 7.428 (ref 7.25–7.43)
pO2, Ven: 35 mmHg (ref 32–45)

## 2022-05-19 LAB — COMPREHENSIVE METABOLIC PANEL
ALT: 65 U/L — ABNORMAL HIGH (ref 0–44)
AST: 53 U/L — ABNORMAL HIGH (ref 15–41)
Albumin: 2 g/dL — ABNORMAL LOW (ref 3.5–5.0)
Alkaline Phosphatase: 140 U/L — ABNORMAL HIGH (ref 38–126)
Anion gap: 10 (ref 5–15)
BUN: 24 mg/dL — ABNORMAL HIGH (ref 8–23)
CO2: 39 mmol/L — ABNORMAL HIGH (ref 22–32)
Calcium: 8.6 mg/dL — ABNORMAL LOW (ref 8.9–10.3)
Chloride: 84 mmol/L — ABNORMAL LOW (ref 98–111)
Creatinine, Ser: <0.3 mg/dL — ABNORMAL LOW (ref 0.61–1.24)
Glucose, Bld: 136 mg/dL — ABNORMAL HIGH (ref 70–99)
Potassium: 4.7 mmol/L (ref 3.5–5.1)
Sodium: 133 mmol/L — ABNORMAL LOW (ref 135–145)
Total Bilirubin: 0.1 mg/dL — ABNORMAL LOW (ref 0.3–1.2)
Total Protein: 7.6 g/dL (ref 6.5–8.1)

## 2022-05-19 LAB — CULTURE, BLOOD (ROUTINE X 2)

## 2022-05-19 LAB — LACTIC ACID, PLASMA: Lactic Acid, Venous: 1 mmol/L (ref 0.5–1.9)

## 2022-05-19 MED ORDER — METOPROLOL TARTRATE 25 MG PO TABS: 25.0000 mg | ORAL_TABLET | Freq: Once | ORAL | Status: DC

## 2022-05-19 MED ORDER — LINEZOLID 600 MG/300ML IV SOLN
600.0000 mg | Freq: Two times a day (BID) | INTRAVENOUS | Status: DC
  Administered 2022-05-19: 600 mg via INTRAVENOUS
  Filled 2022-05-19: qty 300

## 2022-05-19 MED ORDER — ALPRAZOLAM 0.25 MG PO TABS: 0.5000 mg | ORAL_TABLET | Freq: Once | ORAL | Status: DC

## 2022-05-19 MED ORDER — SODIUM CHLORIDE 0.9 % IV BOLUS
1000.0000 mL | Freq: Once | INTRAVENOUS | Status: AC
  Administered 2022-05-19: 1000 mL via INTRAVENOUS

## 2022-05-19 MED ORDER — PIPERACILLIN-TAZOBACTAM 3.375 G IVPB 30 MIN
3.3750 g | Freq: Once | INTRAVENOUS | Status: AC
  Administered 2022-05-19: 3.375 g via INTRAVENOUS
  Filled 2022-05-19: qty 50

## 2022-05-19 NOTE — ED Notes (Signed)
Report called to [facility], pt to be returned to facility via [facility].

## 2022-05-19 NOTE — Progress Notes (Signed)
Patient transported to CT & back to ED room 34 on the ventilator with no problems.

## 2022-05-19 NOTE — ED Provider Notes (Addendum)
[place] EMERGENCY DEPARTMENT AT [facility] Provider Note   CSN: [id number] Arrival date & time: 05/19/22  1650     History  Chief Complaint  Patient presents with   Altered Mental Status    Austin Davis is a 82 y.o. male who is trach and vent dependent and has a history of Parkinson's, frequent UTIs, heart failure, here presenting with tremors.  Patient has tremors at baseline.  Patient was at the facility and apparently had seizure-like activity and more altered than usual.  No seizures were noted since he has been in the ED.  Patient is vent dependent and there is no change in the vent settings.  Patient is unable to give me any history.   The history is provided by the nursing home and the EMS personnel.       Home Medications Prior to Admission medications   Medication Sig Start Date End Date Taking? Authorizing Provider  acetaminophen (TYLENOL) 325 MG tablet Place 650 mg into feeding tube every 8 (eight) hours as needed for fever, headache or moderate pain.   Yes [provider]  ALPRAZolam Prudy Feeler) 0.5 MG tablet Place 0.5 mg into feeding tube every 8 (eight) hours as needed for anxiety.   Yes [provider]  esomeprazole (NEXIUM) 40 MG capsule 40 mg See admin instructions. 40 mg via feeding tube once daily.   Yes [provider]  fluticasone (FLONASE) 50 MCG/ACT nasal spray Place 1 spray into both nostrils every 6 (six) hours as needed for allergies.   Yes [provider]  folic acid (FOLVITE) 1 MG tablet Take 1 tablet (1 mg total) by mouth daily. Patient taking differently: Place 1 mg into feeding tube daily. 09/29/20  Yes Azucena Fallen, MD  furosemide (LASIX) 20 MG tablet Place 1 tablet (20 mg total) into feeding tube daily as needed for fluid or edema. 07/03/21  Yes Almon Hercules, MD  hydrOXYzine (ATARAX) 25 MG tablet Place 25 mg into feeding tube every 6 (six) hours as needed for anxiety. 11/01/21  Yes [provider]  hydrOXYzine (ATARAX) 50 MG tablet Place 50 mg into feeding tube every 6 (six) hours.   Yes [provider]  Lactobacillus Rhamnosus, GG, (CULTURELLE PO) Place 1 capsule into feeding tube daily.   Yes [provider]  lactulose (CHRONULAC) 10 GM/15ML solution Place 20 g into feeding tube every 12 (twelve) hours as needed (constipation).   Yes [provider]  levalbuterol (XOPENEX) 1.25 MG/3ML nebulizer solution Take 1.25 mg by nebulization every 6 (six) hours.   Yes [provider]  levothyroxine (SYNTHROID) 25 MCG tablet Place 25 mcg into feeding tube every morning.   Yes [provider]  melatonin 3 MG TABS tablet Place 6 mg into feeding tube at bedtime.   Yes [provider]  metoprolol tartrate (LOPRESSOR) 25 MG tablet Place 25 mg into feeding tube every 12 (twelve) hours.   Yes [provider]  midodrine (PROAMATINE) 10 MG tablet Place 1 tablet (10 mg total) into feeding tube 3 (three) times daily with meals. 05/01/21  Yes Leroy Sea, MD  Multiple Vitamins-Iron (MULTIVITAMINS WITH IRON) TABS tablet 1 tablet See admin instructions. 1 tablet via feeding tube once daily.   Yes [provider]  Nutritional Supplements (FEEDING SUPPLEMENT, OSMOLITE 1.5 CAL,) LIQD Place 60 mL/hr into feeding tube continuous.   Yes [provider]  Omega-3 Fatty Acids (FISH OIL) 1000 MG CAPS Place 1,000 mg into feeding  tube every morning.   Yes [provider]  polyethylene glycol (MIRALAX / GLYCOLAX) 17 g packet Place 17 g into feeding tube every other day.   Yes [provider]  propranolol (INDERAL) 10 MG tablet Place 10 mg into feeding tube See admin instructions. 10 mg via feeding tube once daily as needed for tachycardia. Hold if HR < 60 or sBP < 90.   Yes [provider]  sertraline (ZOLOFT) 50 MG tablet Place 50 mg into feeding tube daily.   Yes [provider]  sodium  chloride flush (NS) 0.9 % SOLN Inject 10 mLs into the vein See admin instructions. 2 entries on MAR: 1) Use 10 mL IV every shift for all unused lumens. Flush each unused lumen. 2) Flush with 10 mL of normal saline before medications and 10 mL after medications   Yes [provider]  tamsulosin (FLOMAX) 0.4 MG CAPS capsule 0.4 mg See admin instructions. 0.4 mg via feeding tube twice daily at 0900, 2100.   Yes [provider]      Allergies    Prednisone and Cortisone    Review of Systems   Review of Systems  Neurological:  Positive for weakness.  Psychiatric/Behavioral:  Positive for confusion.   All other systems reviewed and are negative.   Physical Exam Updated Vital Signs BP 100/66 (BP Location: Left Arm)   Pulse (!) 116   Temp (!) 97 F (36.1 C) (Rectal)   Resp 20   Ht 5\' 10"  (1.778 m)   Wt 56.2 kg   SpO2 100%   BMI 17.78 kg/m  Physical Exam Vitals and nursing note reviewed.  Constitutional:      Comments: Altered and confused and chronically ill  HENT:     Head:     Comments: Confused and no signs of head injury    Mouth/Throat:     Mouth: Mucous membranes are dry.  Eyes:     Extraocular Movements: Extraocular movements intact.     Pupils: Pupils are equal, round, and reactive to light.  Neck:     Comments: Trach in place Cardiovascular:     Rate and Rhythm: Normal rate and regular rhythm.  Pulmonary:     Comments: Diminished bilateral bases Abdominal:     General: Abdomen is flat.     Palpations: Abdomen is soft.     Comments: PEG tube in place and abdomen nontender  Genitourinary:    Comments: Stage I decub ulcer Musculoskeletal:     Comments: Patient is contracted which is baseline  Neurological:     Comments: Patient has essential tremors at baseline  Psychiatric:     Comments: Unable     ED Results / Procedures / Treatments   Labs (all labs ordered are listed, but only abnormal results are displayed) Labs Reviewed  CBC WITH  DIFFERENTIAL/PLATELET - Abnormal; Notable for the following components:      Result Value   WBC 19.7 (*)    RBC 2.95 (*)    Hemoglobin 8.0 (*)    HCT 27.5 (*)    MCHC 29.1 (*)    RDW 18.5 (*)    Platelets 424 (*)    Neutro Abs 17.8 (*)    Lymphs Abs 0.5 (*)    Abs Immature Granulocytes 0.17 (*)    All other components within normal limits  COMPREHENSIVE METABOLIC PANEL - Abnormal; Notable for the following components:   Sodium 133 (*)    Chloride 84 (*)  CO2 39 (*)    Glucose, Bld 136 (*)    BUN 24 (*)    Creatinine, Ser <0.30 (*)    Calcium 8.6 (*)    Albumin 2.0 (*)    AST 53 (*)    ALT 65 (*)    Alkaline Phosphatase 140 (*)    Total Bilirubin 0.1 (*)    All other components within normal limits  I-STAT CHEM 8, ED - Abnormal; Notable for the following components:   Sodium 132 (*)    Chloride 85 (*)    BUN 29 (*)    Creatinine, Ser 0.30 (*)    Glucose, Bld 138 (*)    Calcium, Ion 1.07 (*)    TCO2 44 (*)    Hemoglobin 9.5 (*)    HCT 28.0 (*)    All other components within normal limits  CULTURE, BLOOD (ROUTINE X 2)  CULTURE, BLOOD (ROUTINE X 2)  LACTIC ACID, PLASMA  URINALYSIS, W/ REFLEX TO CULTURE (INFECTION SUSPECTED)  LACTIC ACID, PLASMA    EKG EKG Interpretation  Date/Time:  Monday May 19 2022 16:58:40 EDT Ventricular Rate:  115 PR Interval:  151 QRS Duration: 98 QT Interval:  336 QTC Calculation: 465 R Axis:   81 Text Interpretation: Sinus tachycardia Borderline right axis deviation No significant change since last tracing Confirmed by Richardean Canal [phone]) on 05/19/2022 5:17:06 PM  Radiology DG Chest Port 1 View  Result Date: 05/19/2022 CLINICAL DATA:  Altered mental status EXAM: PORTABLE CHEST 1 VIEW COMPARISON:  03/14/2022, 03/03/2022 FINDINGS: Tracheostomy tube remains in place with the tracheostomy balloon again appearing to be hyperexpanded. Stable heart size status post sternotomy. Chronically coarsened interstitial markings with slightly  increasing opacities bilaterally. No pleural effusion or pneumothorax. A skin fold overlies the periphery of the left hemithorax. IMPRESSION: 1. Chronically coarsened interstitial markings with slightly increasing opacities bilaterally. Findings may reflect superimposed edema or atypical/viral infection. 2. Tracheostomy tube remains in place with the tracheostomy balloon again appearing to be hyperexpanded. Electronically Signed   By: Duanne Guess D.O.   On: 05/19/2022 18:07    Procedures Procedures    CRITICAL CARE Performed by: Richardean Canal   Total critical care time: 39 minutes  Critical care time was exclusive of separately billable procedures and treating other patients.  Critical care was necessary to treat or prevent imminent or life-threatening deterioration.  Critical care was time spent personally by me on the following activities: development of treatment plan with patient and/or surrogate as well as nursing, discussions with consultants, evaluation of patient's response to treatment, examination of patient, obtaining history from patient or surrogate, ordering and performing treatments and interventions, ordering and review of laboratory studies, ordering and review of radiographic studies, pulse oximetry and re-evaluation of patient's condition.   Medications Ordered in ED Medications  linezolid (ZYVOX) IVPB 600 mg (has no administration in time range)  piperacillin-tazobactam (ZOSYN) IVPB 3.375 g (has no administration in time range)  sodium chloride 0.9 % bolus 1,000 mL (1,000 mLs Intravenous New Bag/Given 05/19/22 1718)    ED Course/ Medical Decision Making/ A&P                             Medical Decision Making JASRAJ LAPPE is a 82 y.o. male here presenting with seizure-like activity.  I discussed case with Dr. Jerrell Belfast from neurology.  Neurology service has known the patient very well.  He had multiple EEGs and patient has  been seen by neurology service multiple  times and never had seizure.  They do not recommend starting AEDs.  They recommended toxic metabolic workup.  Patient also has multiple drug-resistant organisms from UTI.  Will plan to get CBC and CMP and lactate and cultures and now cath for UA and chest x-ray.  6:34 PM Patient's blood cell count is up to 19.7.  Lactate is normal.  Chest x-ray showed possible pneumonia.  Urinalysis is sent.  Patient has multiple ESBL in the past.  He just finished cefepime and Zosyn at the facility.  During the last admission patient was given Zyvox and meropenem.  At this point patient will be admitted for sepsis, pneumonia.  Since he is vent dependent, patient will be admitted to the ICU.  8:00 PM ICU consulted on the patient and states that he is from a long-term care facility that can receive IV antibiotics.  They recommend that he receive 10 days of Zosyn.  His white blood cell count is elevated at 19.  Blood cultures are sent.  They reviewed previous susceptibilities.  If his blood cultures are positive then he can be called back and be admitted at that time.  Since there is no changes in his bed setting and he is from a long-term care facility, they felt that patient can go back to the facility.  I did notify Leeroy Bock who is a Engineer, civil (consulting) at [facility] as the doctor at [facility] is not available at this time.  She is comfortable with the plan.  Also recommend Ativan as needed.  9:04 PM Just received another message from critical care who recommend linezolid and meropenem for 10 days   Problems Addressed: HCAP (healthcare-associated pneumonia): acute illness or injury Sepsis, due to unspecified organism, unspecified whether acute organ dysfunction present [facility]): acute illness or injury Urinary tract infection without hematuria, site unspecified: acute illness or injury  Amount and/or Complexity of Data Reviewed Labs: ordered. Decision-making details documented in ED Course. Radiology: ordered and independent  interpretation performed. Decision-making details documented in ED Course. ECG/medicine tests: ordered and independent interpretation performed. Decision-making details documented in ED Course.  Risk Prescription drug management. Decision regarding hospitalization.    Final Clinical Impression(s) / ED Diagnoses Final diagnoses:  None    Rx / DC Orders ED Discharge Orders     None         Charlynne Pander, MD 05/19/22 1837    Charlynne Pander, MD 05/19/22 2001    Charlynne Pander, MD 05/19/22 2012    Charlynne Pander, MD 05/19/22 2105

## 2022-05-19 NOTE — ED Notes (Signed)
[facility] for a doctor to doctor consult--Nathaniel Yaden

## 2022-05-19 NOTE — Discharge Instructions (Addendum)
He was seen by the intensive care doctor and they felt that he is stable to be discharged back to [facility].  He had multiple evaluation previously and does not have seizure.  We think he likely has baseline Parkinson's and sepsis from UTI and pneumonia causing his tremors.   He has a complicated UTI.  The intensive care doctor has seen him and recommend that he is stable to go back to [facility].  They recommend linezolid 600 mg IV BID for 10 days and meropenem 1 g TID for 10 days   I recommend ativan 1 mg every 6 hrs as needed if seizure like activity lasts more than a minute.   Please get repeat CBC in 2 days  Blood cultures were sent and if it is positive you will be called to come back to the ER  Return to ER if he has trouble breathing or seizure-like activity.

## 2022-05-19 NOTE — ED Notes (Signed)
Called [facility] to transport patient back to [facility]

## 2022-05-19 NOTE — ED Triage Notes (Signed)
BIB [facility] from [facility]. Pt presented with seizure like activity and altered mental status. Pt was noted to be twitching from staff at [facility]. No seizure like activity from [facility]. Alert to verbal response. Pt is sinus tach with no signs of pain or discomfort. Trach in place with pressure support of 70%.

## 2022-05-19 NOTE — Consult Note (Addendum)
NAME:  Austin Davis, MRN:  [id number], DOB:  1940-04-02, LOS: 0 ADMISSION DATE:  05/19/2022, CONSULTATION DATE:  4/29 REFERRING MD:  Dr. Silverio Lay EDP, CHIEF COMPLAINT:  Seizure like activity   History of Present Illness:  82 year old male from [facility] with past medical history as below, which is significant for chronic trach/vent, CHF, essential tremor, and paroxysmal atrial fibrillation.  He has multiple recent admissions to [facility] health for possible seizures as well as multidrug resistant infections including pneumonia and cystitis.  He was most recently admitted in the end of February with sepsis and concern for seizures.  Seizure workup at the direction of neurology was again negative.  Sepsis was ultimately felt to be secondary to urinary tract infection and the patient was treated with meropenem and linezolid.  He was eventually discharged back to [facility] with instructions to complete the course of meropenem.  He again presented most emergency department on 4/29 with complaints of seizure-like activity and altered mental status.  Neurology was consulted in the emergency department for concern of seizure.  Patient has undergone multiple EEGs and seizure workups in the past and all have been negative.  It is felt most likely what is being seen is related to tremor and parkinsonian symptoms.  No additional seizure workup was recommended.  CT of the head was unremarkable.  Laboratory evaluation significant for WBC 19.7.  Chest x-ray concerning for pneumonia.  Given his history of MDRO he was treated with zosyn and linezolid for pneumonia/sepsis. UA pending. PCCM was asked to admit for vent and sepsis management .   Pertinent  Medical History   has a past medical history of CHF (congestive heart failure) (HCC), Coronary artery disease, Essential tremor, GI bleed (2019), Headache, Low blood sugar, Mitral regurgitation, Mitral valve prolapse, Osteopenia (2021), Paroxysmal atrial fibrillation (HCC),  Seizure-like activity (HCC), and Tremor.   Significant Hospital Events: Including procedures, antibiotic start and stop dates in addition to other pertinent events    CT head 4/29: no acute intracranial abnormality. Air fluid level in sphenoid sinuses.   Interim History / Subjective:    Objective   Blood pressure 100/66, pulse (!) 116, temperature (!) 97 F (36.1 C), temperature source Rectal, resp. rate 20, height 5\' 10"  (1.778 m), weight 56.2 kg, SpO2 100 %.    Vent Mode: PCV FiO2 (%):  [40 %] 40 % Set Rate:  [18 bmp] 18 bmp PEEP:  [8 cmH20] 8 cmH20 Plateau Pressure:  [28 cmH20] 28 cmH20   Intake/Output Summary (Last 24 hours) at 05/19/2022 1912 Last data filed at 05/19/2022 1836 Gross per 24 hour  Intake 1000 ml  Output --  Net 1000 ml   Filed Weights   05/19/22 1700  Weight: 56.2 kg    Examination: General: Cachectic elderly gentleman on vent with chronic tremor. No distress  HENT: [place]/AT, PERRL, no JVD. #6 distal XLT cuffed. Lungs: Rhonchi R>L. Minimal vent support Cardiovascular: Tachy 109, regular, no MRG Abdomen: Soft, NT, ND. PEG in place. Seems to have some minimal leakage. Surrounding skin intact.  Extremities: Frail. No edema. Stage 1 sacral decub.  Neuro: Spontaneously awake, alert, interactive.   Resolved Hospital Problem list     Assessment & Plan:   epsis: Initially hypotensive, improved with 1 L IVF. Lactic acid 1 UTI: UA pos for hgb, leuks, bacteria. Nitrite negative.  Possible HCAP Hx MDR enterococcus, e. Coli, proteus, klebsiella, and pan sensitive pseudomonas.  - pip/tazo continue based on previous sensitivities. Rec 10 day course.  -  Blood and urine cultures pending - Send tracheal aspirate - There was some mention of possible sinusitis on CT head. If this begins to seem more likely can extend ABX duration to 21 days. Considering his history and UA results, its much more likely we are dealing with UTI.   Chronic respiratory failure with  hypercarbia:  - Continue [facility] vent settings - Continue scheduled xopenex  Chronic hypotension - continue midodrine - holding metoprolol with borderline hypotension - Hold lasix as well for now. Does not appear hypervolemic on exam.   Chronic HFrEF: last echo from 2022 with LVEF 45% - Telemetry monitoring  Hypothyroid - continue synthroid - check TSH  Urinary retention - flomax  Elevated LFT, mild - trend  Anemia: hemoglobin seems to fluctuate 8-11 over the past year. 9.5 on presentation is not outside his normal range.  -Observe for evidence of bleeding. Does have a history of GIB. None reported of late.  -Trend H&H  PAF: not on AC due to frequent GIB. Sinus tach 106 on presentation - tele  [facility] is very capable of providing the care recommended above: recommend discharge to [facility] for antibiotics. Cultures are pending here.  Best Practice (right click and "Reselect all SmartList Selections" daily)     Labs   CBC: Recent Labs  Lab 05/19/22 1704 05/19/22 1741  WBC 19.7*  --   NEUTROABS 17.8*  --   HGB 8.0* 9.5*  HCT 27.5* 28.0*  MCV 93.2  --   PLT 424*  --     Basic Metabolic Panel: Recent Labs  Lab 05/19/22 1704 05/19/22 1741  NA 133* 132*  K 4.7 4.6  CL 84* 85*  CO2 39*  --   GLUCOSE 136* 138*  BUN 24* 29*  CREATININE <0.30* 0.30*  CALCIUM 8.6*  --    GFR: Estimated Creatinine Clearance: 57.6 mL/min (A) (by C-G formula based on SCr of 0.3 mg/dL (L)). Recent Labs  Lab 05/19/22 1704  WBC 19.7*  LATICACIDVEN 1.0    Liver Function Tests: Recent Labs  Lab 05/19/22 1704  AST 53*  ALT 65*  ALKPHOS 140*  BILITOT 0.1*  PROT 7.6  ALBUMIN 2.0*   No results for input(s): "LIPASE", "AMYLASE" in the last 168 hours. No results for input(s): "AMMONIA" in the last 168 hours.  ABG    Component Value Date/Time   PHART 7.456 (H) 02/27/2022 0325   PCO2ART 61.9 (H) 02/27/2022 0325   PO2ART 61 (L) 02/27/2022 0325   HCO3 37.1 (H) 03/12/2022  2100   TCO2 44 (H) 05/19/2022 1741   ACIDBASEDEF 2.0 05/30/2021 0052   O2SAT 86 03/12/2022 2100     Coagulation Profile: No results for input(s): "INR", "PROTIME" in the last 168 hours.  Cardiac Enzymes: No results for input(s): "CKTOTAL", "CKMB", "CKMBINDEX", "TROPONINI" in the last 168 hours.  HbA1C: Hgb A1c MFr Bld  Date/Time Value Ref Range Status  01/04/2022 11:22 PM 5.3 4.8 - 5.6 % Final    Comment:    (NOTE)         Prediabetes: 5.7 - 6.4         Diabetes: >6.4         Glycemic control for adults with diabetes: <7.0   04/11/2021 09:12 PM 5.5 4.8 - 5.6 % Final    Comment:    (NOTE) Pre diabetes:          5.7%-6.4%  Diabetes:              >6.4%  Glycemic control  for   <7.0% adults with diabetes     CBG: No results for input(s): "GLUCAP" in the last 168 hours.  Review of Systems:   Patient is encephalopathic and/or intubated; therefore, history has been obtained from chart review.    Past Medical History:  He,  has a past medical history of CHF (congestive heart failure) (HCC), Coronary artery disease, Essential tremor, GI bleed (2019), Headache, Low blood sugar, Mitral regurgitation, Mitral valve prolapse, Osteopenia (2021), Paroxysmal atrial fibrillation (HCC), Seizure-like activity (HCC), and Tremor.   Surgical History:   Past Surgical History:  Procedure Laterality Date   COLONOSCOPY WITH PROPOFOL N/A 02/19/2017   Procedure: COLONOSCOPY WITH PROPOFOL;  Surgeon: Charlott Rakes, MD;  Location: [facility];  Service: Endoscopy;  Laterality: N/A;   IR GASTROSTOMY TUBE MOD SED  10/18/2020   IR IVC FILTER PLMT / S&I /IMG GUID/MOD SED  11/01/2020   IR REPLACE G-TUBE SIMPLE WO FLUORO  03/15/2022   laser eye surgery for retina detachment     MITRAL VALVE REPAIR  01/2003   monitor  02/05/2006   polyp removal     TONSILLECTOMY     tooth removal     as a teenager   TRACHEOSTOMY TUBE PLACEMENT N/A 10/26/2020   Procedure: TRACHEOSTOMY;  Surgeon: Drema Halon, MD;  Location: [facility] OR;  Service: ENT;  Laterality: N/A;     Social History:   reports that he has never smoked. He has never used smokeless tobacco. He reports current alcohol use. He reports that he does not use drugs.   Family History:  His family history includes Asthma in his daughter; CAD in his mother; Epilepsy in his son; Heart disease in his mother; Heart failure in his father, maternal grandmother, and paternal grandmother; Migraines in his mother; Pneumonia in his maternal grandfather; Skin cancer in his father; Stroke in his paternal grandfather; Tremor in his father; Valvular heart disease in his father. There is no history of Parkinson's disease.   Allergies Allergies  Allergen Reactions   Prednisone Other (See Comments)    Documented on MAR Unknown reaction   Cortisone Other (See Comments)    Documented on MAR Unknown reaction     Home Medications  Prior to Admission medications   Medication Sig Start Date End Date Taking? Authorizing Provider  acetaminophen (TYLENOL) 325 MG tablet Place 650 mg into feeding tube every 8 (eight) hours as needed for fever, headache or moderate pain.   Yes [provider]  ALPRAZolam Prudy Feeler) 0.5 MG tablet Place 0.5 mg into feeding tube every 8 (eight) hours as needed for anxiety.   Yes [provider]  esomeprazole (NEXIUM) 40 MG capsule 40 mg See admin instructions. 40 mg via feeding tube once daily.   Yes [provider]  fluticasone (FLONASE) 50 MCG/ACT nasal spray Place 1 spray into both nostrils every 6 (six) hours as needed for allergies.   Yes [provider]  folic acid (FOLVITE) 1 MG tablet Take 1 tablet (1 mg total) by mouth daily. Patient taking differently: Place 1 mg into feeding tube daily. 09/29/20  Yes Azucena Fallen, MD  furosemide (LASIX) 20 MG tablet Place 1 tablet (20 mg total) into feeding tube daily as needed for fluid or edema. 07/03/21  Yes Almon Hercules, MD   hydrOXYzine (ATARAX) 25 MG tablet Place 25 mg into feeding tube every 6 (six) hours as needed for anxiety. 11/01/21  Yes [provider]  hydrOXYzine (ATARAX) 50 MG tablet Place 50  mg into feeding tube every 6 (six) hours.   Yes [provider]  Lactobacillus Rhamnosus, GG, (CULTURELLE PO) Place 1 capsule into feeding tube daily.   Yes [provider]  lactulose (CHRONULAC) 10 GM/15ML solution Place 20 g into feeding tube every 12 (twelve) hours as needed (constipation).   Yes [provider]  levalbuterol (XOPENEX) 1.25 MG/3ML nebulizer solution Take 1.25 mg by nebulization every 6 (six) hours.   Yes [provider]  levothyroxine (SYNTHROID) 25 MCG tablet Place 25 mcg into feeding tube every morning.   Yes [provider]  melatonin 3 MG TABS tablet Place 6 mg into feeding tube at bedtime.   Yes [provider]  metoprolol tartrate (LOPRESSOR) 25 MG tablet Place 25 mg into feeding tube every 12 (twelve) hours.   Yes [provider]  midodrine (PROAMATINE) 10 MG tablet Place 1 tablet (10 mg total) into feeding tube 3 (three) times daily with meals. 05/01/21  Yes Leroy Sea, MD  Multiple Vitamins-Iron (MULTIVITAMINS WITH IRON) TABS tablet 1 tablet See admin instructions. 1 tablet via feeding tube once daily.   Yes [provider]  Nutritional Supplements (FEEDING SUPPLEMENT, OSMOLITE 1.5 CAL,) LIQD Place 60 mL/hr into feeding tube continuous.   Yes [provider]  Omega-3 Fatty Acids (FISH OIL) 1000 MG CAPS Place 1,000 mg into feeding tube every morning.   Yes [provider]  polyethylene glycol (MIRALAX / GLYCOLAX) 17 g packet Place 17 g into feeding tube every other day.   Yes [provider]  propranolol (INDERAL) 10 MG tablet Place 10 mg into feeding tube See admin instructions. 10 mg via feeding tube once daily as needed for tachycardia. Hold if HR < 60 or sBP < 90.   Yes [provider]  sertraline (ZOLOFT) 50 MG tablet Place 50 mg into feeding tube daily.   Yes [provider]  sodium chloride flush (NS) 0.9 % SOLN Inject 10 mLs into the vein See admin instructions. 2 entries on MAR: 1) Use 10 mL IV every shift for all unused lumens. Flush each unused lumen. 2) Flush with 10 mL of normal saline before medications and 10 mL after medications   Yes [provider]  tamsulosin (FLOMAX) 0.4 MG CAPS capsule 0.4 mg See admin instructions. 0.4 mg via feeding tube twice daily at 0900, 2100.   Yes [provider]     Critical care time:      Joneen Roach, AGACNP-BC [facility]  See Amion for personal pager PCCM on call pager [phone] until 7pm. Please call Elink 7p-7a. [phone]  05/19/2022 8:04 PM      ,

## 2022-05-20 DIAGNOSIS — R1319 Other dysphagia: Secondary | ICD-10-CM | POA: Diagnosis not present

## 2022-05-20 DIAGNOSIS — G4089 Other seizures: Secondary | ICD-10-CM | POA: Diagnosis not present

## 2022-05-20 DIAGNOSIS — J9621 Acute and chronic respiratory failure with hypoxia: Secondary | ICD-10-CM | POA: Diagnosis not present

## 2022-05-20 DIAGNOSIS — Z430 Encounter for attention to tracheostomy: Secondary | ICD-10-CM | POA: Diagnosis not present

## 2022-05-20 DIAGNOSIS — Z9911 Dependence on respirator [ventilator] status: Secondary | ICD-10-CM | POA: Diagnosis not present

## 2022-05-20 DIAGNOSIS — Z431 Encounter for attention to gastrostomy: Secondary | ICD-10-CM | POA: Diagnosis not present

## 2022-05-20 LAB — CULTURE, BLOOD (ROUTINE X 2)

## 2022-05-21 DIAGNOSIS — I480 Paroxysmal atrial fibrillation: Secondary | ICD-10-CM | POA: Diagnosis not present

## 2022-05-21 DIAGNOSIS — J9621 Acute and chronic respiratory failure with hypoxia: Secondary | ICD-10-CM | POA: Diagnosis not present

## 2022-05-21 DIAGNOSIS — G4089 Other seizures: Secondary | ICD-10-CM | POA: Diagnosis not present

## 2022-05-21 DIAGNOSIS — Z9911 Dependence on respirator [ventilator] status: Secondary | ICD-10-CM | POA: Diagnosis not present

## 2022-05-21 DIAGNOSIS — N390 Urinary tract infection, site not specified: Secondary | ICD-10-CM | POA: Diagnosis not present

## 2022-05-21 DIAGNOSIS — Z431 Encounter for attention to gastrostomy: Secondary | ICD-10-CM | POA: Diagnosis not present

## 2022-05-21 DIAGNOSIS — J449 Chronic obstructive pulmonary disease, unspecified: Secondary | ICD-10-CM | POA: Diagnosis not present

## 2022-05-21 DIAGNOSIS — R1319 Other dysphagia: Secondary | ICD-10-CM | POA: Diagnosis not present

## 2022-05-21 DIAGNOSIS — I5023 Acute on chronic systolic (congestive) heart failure: Secondary | ICD-10-CM | POA: Diagnosis not present

## 2022-05-21 DIAGNOSIS — G250 Essential tremor: Secondary | ICD-10-CM | POA: Diagnosis not present

## 2022-05-21 DIAGNOSIS — Z430 Encounter for attention to tracheostomy: Secondary | ICD-10-CM | POA: Diagnosis not present

## 2022-05-21 LAB — URINE CULTURE: Culture: >=100000 — AB

## 2022-05-21 LAB — CULTURE, BLOOD (ROUTINE X 2)

## 2022-05-22 LAB — CULTURE, BLOOD (ROUTINE X 2): Special Requests: ADEQUATE

## 2022-05-22 LAB — URINE CULTURE

## 2022-05-23 ENCOUNTER — Telehealth (HOSPITAL_BASED_OUTPATIENT_CLINIC_OR_DEPARTMENT_OTHER): Payer: Self-pay | Admitting: *Deleted

## 2022-05-23 NOTE — Telephone Encounter (Signed)
Post ED Visit - Positive Culture Follow-up  Culture report reviewed by antimicrobial stewardship pharmacist: [facility] [x]  Francene Finders  [place] Pharm.D. []  Celedonio Miyamoto, Pharm.D., BCPS AQ-ID []  Garvin Fila, Pharm.D., BCPS []  Georgina Pillion, Pharm.D., BCPS []  [place], [address].D., BCPS, AAHIVP []  Estella Husk, Pharm.D., BCPS, AAHIVP []  Lysle Pearl, PharmD, BCPS []  Phillips Climes, PharmD, BCPS []  Agapito Games, PharmD, BCPS []  Verlan Friends, PharmD []  Mervyn Gay, PharmD, BCPS []  Vinnie Level, PharmD  [facility] []  Len Childs, PharmD []  Greer Pickerel, PharmD []  Adalberto Cole, PharmD []  Perlie Gold, Rph []  Lonell Face) Jean Rosenthal, PharmD []  Earl Many, PharmD []  Junita Push, PharmD []  Dorna Leitz, PharmD []  Terrilee Files, PharmD []  Lynann Beaver, PharmD []  Keturah Barre, PharmD []  Loralee Pacas, PharmD []  Bernadene Person, PharmD   Positive urine culture D/C'd back to [facility] with abx therapy  and no further patient follow-up is required at this time.  Virl Axe [facility] 05/23/2022, 2:06 PM

## 2022-05-24 LAB — CULTURE, BLOOD (ROUTINE X 2): Culture: NO GROWTH

## 2022-05-27 ENCOUNTER — Telehealth: Payer: Self-pay

## 2022-05-27 DIAGNOSIS — Z9911 Dependence on respirator [ventilator] status: Secondary | ICD-10-CM | POA: Diagnosis not present

## 2022-05-27 DIAGNOSIS — G4089 Other seizures: Secondary | ICD-10-CM | POA: Diagnosis not present

## 2022-05-27 DIAGNOSIS — Z430 Encounter for attention to tracheostomy: Secondary | ICD-10-CM | POA: Diagnosis not present

## 2022-05-27 DIAGNOSIS — J9621 Acute and chronic respiratory failure with hypoxia: Secondary | ICD-10-CM | POA: Diagnosis not present

## 2022-05-27 DIAGNOSIS — R1319 Other dysphagia: Secondary | ICD-10-CM | POA: Diagnosis not present

## 2022-05-27 DIAGNOSIS — Z431 Encounter for attention to gastrostomy: Secondary | ICD-10-CM | POA: Diagnosis not present

## 2022-05-27 NOTE — Telephone Encounter (Signed)
Transition Care Management Unsuccessful Follow-up Telephone Call  Date of discharge and from where:  Austin Davis 4/29  Attempts:  2nd Attempt  Reason for unsuccessful TCM follow-up call:  Unable to leave message   Lenard Forth [facility] Guide, [facility] [phone] [address] [place], [place], [place] [address] Phone: [phone] Email: [email or web address]

## 2022-05-27 NOTE — Telephone Encounter (Signed)
Transition Care Management Unsuccessful Follow-up Telephone Call  Date of discharge and from where:  [facility]  Attempts:  1st Attempt  Reason for unsuccessful TCM follow-up call:   Unable to leave a message   Lenard Forth [facility] Guide, [facility] [phone] [address] [place], [place], [place] [address] Phone: [phone] Email: [email or web address]

## 2022-05-28 DIAGNOSIS — R1319 Other dysphagia: Secondary | ICD-10-CM | POA: Diagnosis not present

## 2022-05-28 DIAGNOSIS — J9621 Acute and chronic respiratory failure with hypoxia: Secondary | ICD-10-CM | POA: Diagnosis not present

## 2022-05-28 DIAGNOSIS — R109 Unspecified abdominal pain: Secondary | ICD-10-CM | POA: Diagnosis not present

## 2022-05-28 DIAGNOSIS — Z9911 Dependence on respirator [ventilator] status: Secondary | ICD-10-CM | POA: Diagnosis not present

## 2022-05-28 DIAGNOSIS — Z430 Encounter for attention to tracheostomy: Secondary | ICD-10-CM | POA: Diagnosis not present

## 2022-05-28 DIAGNOSIS — G4089 Other seizures: Secondary | ICD-10-CM | POA: Diagnosis not present

## 2022-05-28 DIAGNOSIS — J189 Pneumonia, unspecified organism: Secondary | ICD-10-CM | POA: Diagnosis not present

## 2022-05-28 DIAGNOSIS — Z431 Encounter for attention to gastrostomy: Secondary | ICD-10-CM | POA: Diagnosis not present

## 2022-05-29 DIAGNOSIS — J9621 Acute and chronic respiratory failure with hypoxia: Secondary | ICD-10-CM | POA: Diagnosis not present

## 2022-06-02 DIAGNOSIS — J9621 Acute and chronic respiratory failure with hypoxia: Secondary | ICD-10-CM | POA: Diagnosis not present

## 2022-06-03 DIAGNOSIS — R1319 Other dysphagia: Secondary | ICD-10-CM | POA: Diagnosis not present

## 2022-06-03 DIAGNOSIS — J9621 Acute and chronic respiratory failure with hypoxia: Secondary | ICD-10-CM | POA: Diagnosis not present

## 2022-06-03 DIAGNOSIS — Z9911 Dependence on respirator [ventilator] status: Secondary | ICD-10-CM | POA: Diagnosis not present

## 2022-06-03 DIAGNOSIS — Z430 Encounter for attention to tracheostomy: Secondary | ICD-10-CM | POA: Diagnosis not present

## 2022-06-03 DIAGNOSIS — Z431 Encounter for attention to gastrostomy: Secondary | ICD-10-CM | POA: Diagnosis not present

## 2022-06-03 DIAGNOSIS — G4089 Other seizures: Secondary | ICD-10-CM | POA: Diagnosis not present

## 2022-06-10 DIAGNOSIS — G4089 Other seizures: Secondary | ICD-10-CM | POA: Diagnosis not present

## 2022-06-10 DIAGNOSIS — J9621 Acute and chronic respiratory failure with hypoxia: Secondary | ICD-10-CM | POA: Diagnosis not present

## 2022-06-10 DIAGNOSIS — Z9911 Dependence on respirator [ventilator] status: Secondary | ICD-10-CM | POA: Diagnosis not present

## 2022-06-10 DIAGNOSIS — Z431 Encounter for attention to gastrostomy: Secondary | ICD-10-CM | POA: Diagnosis not present

## 2022-06-10 DIAGNOSIS — Z430 Encounter for attention to tracheostomy: Secondary | ICD-10-CM | POA: Diagnosis not present

## 2022-06-10 DIAGNOSIS — R1319 Other dysphagia: Secondary | ICD-10-CM | POA: Diagnosis not present

## 2022-06-12 ENCOUNTER — Encounter (HOSPITAL_COMMUNITY): Payer: Self-pay

## 2022-06-12 ENCOUNTER — Other Ambulatory Visit: Payer: Self-pay

## 2022-06-12 ENCOUNTER — Emergency Department (HOSPITAL_COMMUNITY): Payer: Medicare Other

## 2022-06-12 ENCOUNTER — Emergency Department (HOSPITAL_COMMUNITY)
Admission: EM | Admit: 2022-06-12 | Discharge: 2022-06-12 | Disposition: A | Discharge status: Home / Self Care | Payer: Medicare Other | Attending: Emergency Medicine | Admitting: Emergency Medicine

## 2022-06-12 DIAGNOSIS — J9621 Acute and chronic respiratory failure with hypoxia: Secondary | ICD-10-CM | POA: Diagnosis not present

## 2022-06-12 DIAGNOSIS — Y732 Prosthetic and other implants, materials and accessory gastroenterology and urology devices associated with adverse incidents: Secondary | ICD-10-CM | POA: Diagnosis not present

## 2022-06-12 DIAGNOSIS — R109 Unspecified abdominal pain: Secondary | ICD-10-CM | POA: Diagnosis not present

## 2022-06-12 DIAGNOSIS — T85848A Pain due to other internal prosthetic devices, implants and grafts, initial encounter: Secondary | ICD-10-CM | POA: Diagnosis not present

## 2022-06-12 DIAGNOSIS — K9423 Gastrostomy malfunction: Secondary | ICD-10-CM | POA: Diagnosis not present

## 2022-06-12 DIAGNOSIS — Z4659 Encounter for fitting and adjustment of other gastrointestinal appliance and device: Secondary | ICD-10-CM | POA: Diagnosis not present

## 2022-06-12 LAB — CBG MONITORING, ED: Glucose-Capillary: 113 mg/dL — ABNORMAL HIGH (ref 70–99)

## 2022-06-12 MED ORDER — DIATRIZOATE MEGLUMINE & SODIUM 66-10 % PO SOLN
30.0000 mL | Freq: Once | ORAL | Status: AC
  Administered 2022-06-12: 30 mL
  Filled 2022-06-12: qty 30

## 2022-06-12 MED ORDER — NYSTATIN 100000 UNIT/GM EX POWD: 1.0000 | Freq: Three times a day (TID) | CUTANEOUS | 1 refills | Status: DC

## 2022-06-12 MED ORDER — DIATRIZOATE MEGLUMINE & SODIUM 66-10 % PO SOLN
ORAL | Status: AC
  Filled 2022-06-12: qty 30

## 2022-06-12 NOTE — ED Triage Notes (Signed)
Pt BIB [facility] from [facility] for PEG tube dislodgement. Per [facility], [facility] did not have a MD to put PEG tube back in place. Pt has no complaints at this time.

## 2022-06-13 DIAGNOSIS — R109 Unspecified abdominal pain: Secondary | ICD-10-CM | POA: Diagnosis not present

## 2022-06-13 DIAGNOSIS — K5909 Other constipation: Secondary | ICD-10-CM | POA: Diagnosis not present

## 2022-06-13 NOTE — ED Provider Notes (Signed)
[place] [facility] DEPARTMENT AT [facility] Provider Note   CSN: [id number] Arrival date & time: 06/12/22  0046     History  Chief Complaint  Patient presents with   PEG Tube Issues    Austin Davis is a 82 y.o. male.  Patient nonverbal with a trach and PEG tube.  PEG tube was in place on arrival.  Apparently that is why he was sent here because it was dislodged.        Home Medications Prior to Admission medications   Medication Sig Start Date End Date Taking? Authorizing Provider  nystatin (MYCOSTATIN/NYSTOP) powder Apply 1 Application topically 3 (three) times daily. Until redness improves 06/12/22  Yes Ritaj Dullea, Barbara Cower, MD  acetaminophen (TYLENOL) 325 MG tablet Place 650 mg into feeding tube every 8 (eight) hours as needed for fever, headache or moderate pain.    [provider]  ALPRAZolam Prudy Feeler) 0.5 MG tablet Place 0.5 mg into feeding tube every 8 (eight) hours as needed for anxiety.    [provider]  esomeprazole (NEXIUM) 40 MG capsule 40 mg See admin instructions. 40 mg via feeding tube once daily.    [provider]  fluticasone (FLONASE) 50 MCG/ACT nasal spray Place 1 spray into both nostrils every 6 (six) hours as needed for allergies.    [provider]  folic acid (FOLVITE) 1 MG tablet Take 1 tablet (1 mg total) by mouth daily. Patient taking differently: Place 1 mg into feeding tube daily. 09/29/20   Azucena Fallen, MD  furosemide (LASIX) 20 MG tablet Place 1 tablet (20 mg total) into feeding tube daily as needed for fluid or edema. 07/03/21   Almon Hercules, MD  hydrOXYzine (ATARAX) 25 MG tablet Place 25 mg into feeding tube every 6 (six) hours as needed for anxiety. 11/01/21   [provider]  hydrOXYzine (ATARAX) 50 MG tablet Place 50 mg into feeding tube every 6 (six) hours.    [provider]  Lactobacillus Rhamnosus, GG, (CULTURELLE PO) Place 1 capsule into feeding tube daily.    [provider]  lactulose (CHRONULAC) 10 GM/15ML solution Place 20 g into feeding tube every 12 (twelve) hours as needed (constipation).    [provider]  levalbuterol Pauline Aus) 1.25 MG/3ML nebulizer solution Take 1.25 mg by nebulization every 6 (six) hours.    [provider]  levothyroxine (SYNTHROID) 25 MCG tablet Place 25 mcg into feeding tube every morning.    [provider]  melatonin 3 MG TABS tablet Place 6 mg into feeding tube at bedtime.    [provider]  metoprolol tartrate (LOPRESSOR) 25 MG tablet Place 25 mg into feeding tube every 12 (twelve) hours.    [provider]  midodrine (PROAMATINE) 10 MG tablet Place 1 tablet (10 mg total) into feeding tube 3 (three) times daily with meals. 05/01/21   Leroy Sea, MD  Multiple Vitamins-Iron (MULTIVITAMINS WITH IRON) TABS tablet 1 tablet See admin instructions. 1 tablet via feeding tube once daily.    [provider]  Nutritional Supplements (FEEDING SUPPLEMENT, OSMOLITE 1.5 CAL,) LIQD Place 60 mL/hr into feeding tube continuous.    [provider]  Omega-3 Fatty Acids (FISH OIL) 1000 MG CAPS Place 1,000 mg into feeding tube every morning.    [provider]  polyethylene glycol (MIRALAX / GLYCOLAX) 17 g packet Place 17 g into feeding tube every other day.    [provider]  propranolol (INDERAL) 10 MG tablet  Place 10 mg into feeding tube See admin instructions. 10 mg via feeding tube once daily as needed for tachycardia. Hold if HR < 60 or sBP < 90.    [provider]  sertraline (ZOLOFT) 50 MG tablet Place 50 mg into feeding tube daily.    [provider]  sodium chloride flush (NS) 0.9 % SOLN Inject 10 mLs into the vein See admin instructions. 2 entries on MAR: 1) Use 10 mL IV every shift for all unused lumens. Flush each unused lumen. 2) Flush with 10 mL of normal saline before medications and 10 mL after medications    [provider]  tamsulosin (FLOMAX) 0.4 MG CAPS capsule 0.4 mg See admin instructions. 0.4 mg via feeding tube twice daily at 0900, 2100.    [provider]      Allergies    Prednisone and Cortisone    Review of Systems   Review of Systems  Physical Exam Updated Vital Signs BP 125/82 (BP Location: Right Arm)   Pulse (!) 114   Temp 98.6 F (37 C) (Oral)   Resp (!) 21   Wt 55.8 kg   SpO2 98%   BMI 17.65 kg/m  Physical Exam Vitals and nursing note reviewed.  Constitutional:      Appearance: He is well-developed.  HENT:     Head: Normocephalic and atraumatic.  Cardiovascular:     Rate and Rhythm: Normal rate.  Pulmonary:     Effort: Pulmonary effort is normal. No respiratory distress.  Abdominal:     General: There is no distension.  Musculoskeletal:        General: Normal range of motion.     Cervical back: Normal range of motion.  Skin:    General: Skin is warm and dry.     Findings: Erythema (Mild erythema around PEG tube site) present.  Neurological:     Mental Status: He is alert. Mental status is at baseline.     ED Results / Procedures / Treatments   Labs (all labs ordered are listed, but only abnormal results are displayed) Labs Reviewed  CBG MONITORING, ED - Abnormal; Notable for the following components:      Result Value   Glucose-Capillary 113 (*)    All other components within normal limits    EKG None  Radiology DG ABDOMEN PEG TUBE LOCATION  Result Date: 06/12/2022 CLINICAL DATA:  Peg tube placement EXAM: ABDOMEN - 1 VIEW COMPARISON:  06/12/2022 FINDINGS: Since the prior examination, the retaining balloon of the gastrostomy catheter has been replaced with radiolucent material and it has decreased in overall size suggesting at least partial evacuation. The balloon remains inflated however. Contrast is now been instilled through the gastric port and opacifies the decompressed stomach and is seen passing in antegrade fashion into multiple  decompressed loops of proximal small bowel. A a small proximal jejunal diverticulum noted. No extraluminal contrast identified. Inferior vena cava filter again noted. IMPRESSION: 1. Interval revision of the retaining balloon of the gastrostomy catheter. Gastrostomy catheter position within the gastric lumen. No obstruction. No extraluminal contrast identified. Electronically Signed   By: Helyn Numbers M.D.   On: 06/12/2022 02:55   DG ABDOMEN PEG TUBE LOCATION  Result Date: 06/12/2022 CLINICAL DATA:  Leaking gastrostomy tube EXAM: ABDOMEN - 1 VIEW COMPARISON:  None Available. FINDINGS: Contrast material has been injected through the gastrostomy tube and now fills the balloon. IVC filter noted. Nonobstructive bowel gas pattern. IMPRESSION: Contrast material has been injected through  the gastrostomy tube and now fills the balloon. Electronically Signed   By: Deatra Robinson M.D.   On: 06/12/2022 02:06    Procedures Procedures    Medications Ordered in ED Medications  diatrizoate meglumine-sodium (GASTROGRAFIN) 66-10 % solution 30 mL (30 mLs Per Tube Given 06/12/22 0307)  diatrizoate meglumine-sodium (GASTROGRAFIN) 66-10 % solution 30 mL (30 mLs Per Tube Given 06/12/22 0306)    ED Course/ Medical Decision Making/ A&P                             Medical Decision Making Amount and/or Complexity of Data Reviewed Radiology: ordered.  Risk Prescription drug management.   On arrival his PEG tube was in place.  Slightly withdrawn and leaking a little bit of stomach contents around it.  I withdrew 10 cc of fluid out of it and put it back in patient did not have any evidence that this was uncomfortable at all.  Abdominal x-ray was done with contrast to ensure placement.  Initially the contrast was placed into the incorrect port and thus stayed in the balloon.  However this did show that the balloon did not have a leak and seem to be in the right spot.  The contrast was all removed from the balloon and  then new con tress was placed into the correct lumen and the study was normal.  Might have a mild yeast infection around the site secondary to leaking fluid so we will have him use nystatin.  Really secured the tube.  Refilled the balloon with sterile water.  Stable for discharge.  Final Clinical Impression(s) / ED Diagnoses Final diagnoses:  Pain around percutaneous endoscopic gastrostomy (PEG) tube site, initial encounter    Rx / DC Orders ED Discharge Orders          Ordered    nystatin (MYCOSTATIN/NYSTOP) powder  3 times daily        06/12/22 0302              Thamas Appleyard, Barbara Cower, MD 06/13/22 2595

## 2022-06-15 DIAGNOSIS — J9621 Acute and chronic respiratory failure with hypoxia: Secondary | ICD-10-CM | POA: Diagnosis not present

## 2022-06-17 DIAGNOSIS — Z431 Encounter for attention to gastrostomy: Secondary | ICD-10-CM | POA: Diagnosis not present

## 2022-06-17 DIAGNOSIS — R1319 Other dysphagia: Secondary | ICD-10-CM | POA: Diagnosis not present

## 2022-06-17 DIAGNOSIS — Z9911 Dependence on respirator [ventilator] status: Secondary | ICD-10-CM | POA: Diagnosis not present

## 2022-06-17 DIAGNOSIS — J9621 Acute and chronic respiratory failure with hypoxia: Secondary | ICD-10-CM | POA: Diagnosis not present

## 2022-06-17 DIAGNOSIS — Z430 Encounter for attention to tracheostomy: Secondary | ICD-10-CM | POA: Diagnosis not present

## 2022-06-17 DIAGNOSIS — G4089 Other seizures: Secondary | ICD-10-CM | POA: Diagnosis not present

## 2022-06-20 DIAGNOSIS — Z431 Encounter for attention to gastrostomy: Secondary | ICD-10-CM | POA: Diagnosis not present

## 2022-06-20 DIAGNOSIS — G4089 Other seizures: Secondary | ICD-10-CM | POA: Diagnosis not present

## 2022-06-20 DIAGNOSIS — R1319 Other dysphagia: Secondary | ICD-10-CM | POA: Diagnosis not present

## 2022-06-20 DIAGNOSIS — Z430 Encounter for attention to tracheostomy: Secondary | ICD-10-CM | POA: Diagnosis not present

## 2022-06-20 DIAGNOSIS — Z9911 Dependence on respirator [ventilator] status: Secondary | ICD-10-CM | POA: Diagnosis not present

## 2022-06-20 DIAGNOSIS — J9621 Acute and chronic respiratory failure with hypoxia: Secondary | ICD-10-CM | POA: Diagnosis not present

## 2022-06-23 DIAGNOSIS — R000 Tachycardia, unspecified: Secondary | ICD-10-CM | POA: Diagnosis not present

## 2022-06-23 DIAGNOSIS — I4891 Unspecified atrial fibrillation: Secondary | ICD-10-CM | POA: Diagnosis not present

## 2022-06-24 DIAGNOSIS — R1319 Other dysphagia: Secondary | ICD-10-CM | POA: Diagnosis not present

## 2022-06-24 DIAGNOSIS — I5023 Acute on chronic systolic (congestive) heart failure: Secondary | ICD-10-CM | POA: Diagnosis not present

## 2022-06-24 DIAGNOSIS — I480 Paroxysmal atrial fibrillation: Secondary | ICD-10-CM | POA: Diagnosis not present

## 2022-06-24 DIAGNOSIS — Z9911 Dependence on respirator [ventilator] status: Secondary | ICD-10-CM | POA: Diagnosis not present

## 2022-06-24 DIAGNOSIS — J449 Chronic obstructive pulmonary disease, unspecified: Secondary | ICD-10-CM | POA: Diagnosis not present

## 2022-06-24 DIAGNOSIS — Z431 Encounter for attention to gastrostomy: Secondary | ICD-10-CM | POA: Diagnosis not present

## 2022-06-24 DIAGNOSIS — G250 Essential tremor: Secondary | ICD-10-CM | POA: Diagnosis not present

## 2022-06-24 DIAGNOSIS — J9621 Acute and chronic respiratory failure with hypoxia: Secondary | ICD-10-CM | POA: Diagnosis not present

## 2022-06-24 DIAGNOSIS — Z430 Encounter for attention to tracheostomy: Secondary | ICD-10-CM | POA: Diagnosis not present

## 2022-06-24 DIAGNOSIS — N390 Urinary tract infection, site not specified: Secondary | ICD-10-CM | POA: Diagnosis not present

## 2022-06-24 DIAGNOSIS — G4089 Other seizures: Secondary | ICD-10-CM | POA: Diagnosis not present

## 2022-06-25 DIAGNOSIS — J9621 Acute and chronic respiratory failure with hypoxia: Secondary | ICD-10-CM | POA: Diagnosis not present

## 2022-06-25 DIAGNOSIS — Z430 Encounter for attention to tracheostomy: Secondary | ICD-10-CM | POA: Diagnosis not present

## 2022-06-25 DIAGNOSIS — R1319 Other dysphagia: Secondary | ICD-10-CM | POA: Diagnosis not present

## 2022-06-25 DIAGNOSIS — G4089 Other seizures: Secondary | ICD-10-CM | POA: Diagnosis not present

## 2022-06-25 DIAGNOSIS — Z431 Encounter for attention to gastrostomy: Secondary | ICD-10-CM | POA: Diagnosis not present

## 2022-06-25 DIAGNOSIS — Z9911 Dependence on respirator [ventilator] status: Secondary | ICD-10-CM | POA: Diagnosis not present

## 2022-06-26 DIAGNOSIS — G4089 Other seizures: Secondary | ICD-10-CM | POA: Diagnosis not present

## 2022-06-26 DIAGNOSIS — J9621 Acute and chronic respiratory failure with hypoxia: Secondary | ICD-10-CM | POA: Diagnosis not present

## 2022-06-26 DIAGNOSIS — R1319 Other dysphagia: Secondary | ICD-10-CM | POA: Diagnosis not present

## 2022-06-26 DIAGNOSIS — Z430 Encounter for attention to tracheostomy: Secondary | ICD-10-CM | POA: Diagnosis not present

## 2022-06-26 DIAGNOSIS — I4891 Unspecified atrial fibrillation: Secondary | ICD-10-CM | POA: Diagnosis not present

## 2022-06-26 DIAGNOSIS — Z9911 Dependence on respirator [ventilator] status: Secondary | ICD-10-CM | POA: Diagnosis not present

## 2022-06-26 DIAGNOSIS — R000 Tachycardia, unspecified: Secondary | ICD-10-CM | POA: Diagnosis not present

## 2022-06-26 DIAGNOSIS — Z431 Encounter for attention to gastrostomy: Secondary | ICD-10-CM | POA: Diagnosis not present

## 2022-06-27 DIAGNOSIS — Z430 Encounter for attention to tracheostomy: Secondary | ICD-10-CM | POA: Diagnosis not present

## 2022-06-27 DIAGNOSIS — R1319 Other dysphagia: Secondary | ICD-10-CM | POA: Diagnosis not present

## 2022-06-27 DIAGNOSIS — J9621 Acute and chronic respiratory failure with hypoxia: Secondary | ICD-10-CM | POA: Diagnosis not present

## 2022-06-27 DIAGNOSIS — Z9911 Dependence on respirator [ventilator] status: Secondary | ICD-10-CM | POA: Diagnosis not present

## 2022-06-27 DIAGNOSIS — Z431 Encounter for attention to gastrostomy: Secondary | ICD-10-CM | POA: Diagnosis not present

## 2022-06-27 DIAGNOSIS — G4089 Other seizures: Secondary | ICD-10-CM | POA: Diagnosis not present

## 2022-06-29 ENCOUNTER — Encounter (HOSPITAL_COMMUNITY): Payer: Self-pay

## 2022-06-29 ENCOUNTER — Inpatient Hospital Stay (HOSPITAL_COMMUNITY): Payer: Medicare Other

## 2022-06-29 ENCOUNTER — Emergency Department (HOSPITAL_COMMUNITY): Payer: Medicare Other

## 2022-06-29 ENCOUNTER — Other Ambulatory Visit: Payer: Self-pay

## 2022-06-29 ENCOUNTER — Inpatient Hospital Stay (HOSPITAL_COMMUNITY)
Admission: EM | Admit: 2022-06-29 | Discharge: 2022-07-04 | DRG: 870 | Disposition: A | Discharge status: Other Acute Inpatient Hospital | Payer: Medicare Other | Source: Other Acute Inpatient Hospital | Attending: Internal Medicine | Admitting: Internal Medicine

## 2022-06-29 DIAGNOSIS — A4152 Sepsis due to Pseudomonas: Secondary | ICD-10-CM | POA: Diagnosis not present

## 2022-06-29 DIAGNOSIS — Z430 Encounter for attention to tracheostomy: Secondary | ICD-10-CM | POA: Diagnosis not present

## 2022-06-29 DIAGNOSIS — J9620 Acute and chronic respiratory failure, unspecified whether with hypoxia or hypercapnia: Secondary | ICD-10-CM | POA: Diagnosis not present

## 2022-06-29 DIAGNOSIS — Z4659 Encounter for fitting and adjustment of other gastrointestinal appliance and device: Secondary | ICD-10-CM | POA: Diagnosis not present

## 2022-06-29 DIAGNOSIS — I4891 Unspecified atrial fibrillation: Secondary | ICD-10-CM | POA: Diagnosis not present

## 2022-06-29 DIAGNOSIS — Z825 Family history of asthma and other chronic lower respiratory diseases: Secondary | ICD-10-CM

## 2022-06-29 DIAGNOSIS — E870 Hyperosmolality and hypernatremia: Secondary | ICD-10-CM | POA: Diagnosis present

## 2022-06-29 DIAGNOSIS — J449 Chronic obstructive pulmonary disease, unspecified: Secondary | ICD-10-CM | POA: Diagnosis present

## 2022-06-29 DIAGNOSIS — Z681 Body mass index (BMI) 19 or less, adult: Secondary | ICD-10-CM

## 2022-06-29 DIAGNOSIS — I5032 Chronic diastolic (congestive) heart failure: Secondary | ICD-10-CM | POA: Diagnosis present

## 2022-06-29 DIAGNOSIS — Z9889 Other specified postprocedural states: Secondary | ICD-10-CM

## 2022-06-29 DIAGNOSIS — G9341 Metabolic encephalopathy: Secondary | ICD-10-CM | POA: Diagnosis present

## 2022-06-29 DIAGNOSIS — R6521 Severe sepsis with septic shock: Secondary | ICD-10-CM | POA: Diagnosis not present

## 2022-06-29 DIAGNOSIS — I340 Nonrheumatic mitral (valve) insufficiency: Secondary | ICD-10-CM | POA: Diagnosis not present

## 2022-06-29 DIAGNOSIS — L89151 Pressure ulcer of sacral region, stage 1: Secondary | ICD-10-CM | POA: Diagnosis present

## 2022-06-29 DIAGNOSIS — G40909 Epilepsy, unspecified, not intractable, without status epilepticus: Secondary | ICD-10-CM | POA: Diagnosis not present

## 2022-06-29 DIAGNOSIS — Z820 Family history of epilepsy and other diseases of the nervous system: Secondary | ICD-10-CM

## 2022-06-29 DIAGNOSIS — I468 Cardiac arrest due to other underlying condition: Secondary | ICD-10-CM | POA: Diagnosis present

## 2022-06-29 DIAGNOSIS — G20A1 Parkinson's disease without dyskinesia, without mention of fluctuations: Secondary | ICD-10-CM | POA: Diagnosis not present

## 2022-06-29 DIAGNOSIS — E43 Unspecified severe protein-calorie malnutrition: Secondary | ICD-10-CM | POA: Diagnosis present

## 2022-06-29 DIAGNOSIS — Z1624 Resistance to multiple antibiotics: Secondary | ICD-10-CM | POA: Diagnosis present

## 2022-06-29 DIAGNOSIS — R911 Solitary pulmonary nodule: Secondary | ICD-10-CM | POA: Diagnosis present

## 2022-06-29 DIAGNOSIS — T85528A Displacement of other gastrointestinal prosthetic devices, implants and grafts, initial encounter: Secondary | ICD-10-CM | POA: Diagnosis present

## 2022-06-29 DIAGNOSIS — J440 Chronic obstructive pulmonary disease with acute lower respiratory infection: Secondary | ICD-10-CM | POA: Diagnosis present

## 2022-06-29 DIAGNOSIS — Z8249 Family history of ischemic heart disease and other diseases of the circulatory system: Secondary | ICD-10-CM

## 2022-06-29 DIAGNOSIS — E039 Hypothyroidism, unspecified: Secondary | ICD-10-CM | POA: Diagnosis present

## 2022-06-29 DIAGNOSIS — N390 Urinary tract infection, site not specified: Secondary | ICD-10-CM | POA: Diagnosis present

## 2022-06-29 DIAGNOSIS — Z9911 Dependence on respirator [ventilator] status: Secondary | ICD-10-CM | POA: Diagnosis not present

## 2022-06-29 DIAGNOSIS — J151 Pneumonia due to Pseudomonas: Secondary | ICD-10-CM | POA: Diagnosis present

## 2022-06-29 DIAGNOSIS — D631 Anemia in chronic kidney disease: Secondary | ICD-10-CM | POA: Diagnosis present

## 2022-06-29 DIAGNOSIS — J9622 Acute and chronic respiratory failure with hypercapnia: Secondary | ICD-10-CM | POA: Diagnosis present

## 2022-06-29 DIAGNOSIS — J989 Respiratory disorder, unspecified: Secondary | ICD-10-CM | POA: Diagnosis present

## 2022-06-29 DIAGNOSIS — Z931 Gastrostomy status: Secondary | ICD-10-CM

## 2022-06-29 DIAGNOSIS — A419 Sepsis, unspecified organism: Secondary | ICD-10-CM | POA: Diagnosis not present

## 2022-06-29 DIAGNOSIS — Z79899 Other long term (current) drug therapy: Secondary | ICD-10-CM

## 2022-06-29 DIAGNOSIS — Z1152 Encounter for screening for COVID-19: Secondary | ICD-10-CM

## 2022-06-29 DIAGNOSIS — R000 Tachycardia, unspecified: Secondary | ICD-10-CM | POA: Diagnosis not present

## 2022-06-29 DIAGNOSIS — J189 Pneumonia, unspecified organism: Secondary | ICD-10-CM | POA: Diagnosis not present

## 2022-06-29 DIAGNOSIS — R6520 Severe sepsis without septic shock: Secondary | ICD-10-CM | POA: Diagnosis not present

## 2022-06-29 DIAGNOSIS — Z930 Tracheostomy status: Secondary | ICD-10-CM | POA: Diagnosis not present

## 2022-06-29 DIAGNOSIS — D62 Acute posthemorrhagic anemia: Secondary | ICD-10-CM | POA: Diagnosis present

## 2022-06-29 DIAGNOSIS — L8990 Pressure ulcer of unspecified site, unspecified stage: Secondary | ICD-10-CM | POA: Diagnosis present

## 2022-06-29 DIAGNOSIS — G20C Parkinsonism, unspecified: Secondary | ICD-10-CM | POA: Diagnosis present

## 2022-06-29 DIAGNOSIS — R609 Edema, unspecified: Secondary | ICD-10-CM | POA: Diagnosis not present

## 2022-06-29 DIAGNOSIS — I469 Cardiac arrest, cause unspecified: Secondary | ICD-10-CM | POA: Diagnosis not present

## 2022-06-29 DIAGNOSIS — J168 Pneumonia due to other specified infectious organisms: Secondary | ICD-10-CM | POA: Diagnosis not present

## 2022-06-29 DIAGNOSIS — R195 Other fecal abnormalities: Secondary | ICD-10-CM | POA: Diagnosis present

## 2022-06-29 DIAGNOSIS — L03818 Cellulitis of other sites: Secondary | ICD-10-CM

## 2022-06-29 DIAGNOSIS — I5082 Biventricular heart failure: Secondary | ICD-10-CM | POA: Diagnosis not present

## 2022-06-29 DIAGNOSIS — E874 Mixed disorder of acid-base balance: Secondary | ICD-10-CM | POA: Diagnosis not present

## 2022-06-29 DIAGNOSIS — I480 Paroxysmal atrial fibrillation: Secondary | ICD-10-CM | POA: Diagnosis not present

## 2022-06-29 DIAGNOSIS — Z808 Family history of malignant neoplasm of other organs or systems: Secondary | ICD-10-CM

## 2022-06-29 DIAGNOSIS — I5042 Chronic combined systolic (congestive) and diastolic (congestive) heart failure: Secondary | ICD-10-CM | POA: Diagnosis present

## 2022-06-29 DIAGNOSIS — J90 Pleural effusion, not elsewhere classified: Secondary | ICD-10-CM | POA: Diagnosis not present

## 2022-06-29 DIAGNOSIS — J9612 Chronic respiratory failure with hypercapnia: Secondary | ICD-10-CM | POA: Diagnosis not present

## 2022-06-29 DIAGNOSIS — Z888 Allergy status to other drugs, medicaments and biological substances status: Secondary | ICD-10-CM

## 2022-06-29 DIAGNOSIS — I2510 Atherosclerotic heart disease of native coronary artery without angina pectoris: Secondary | ICD-10-CM | POA: Diagnosis present

## 2022-06-29 DIAGNOSIS — Z87440 Personal history of urinary (tract) infections: Secondary | ICD-10-CM

## 2022-06-29 DIAGNOSIS — J9690 Respiratory failure, unspecified, unspecified whether with hypoxia or hypercapnia: Secondary | ICD-10-CM | POA: Diagnosis not present

## 2022-06-29 DIAGNOSIS — K9423 Gastrostomy malfunction: Secondary | ICD-10-CM | POA: Diagnosis not present

## 2022-06-29 DIAGNOSIS — J9621 Acute and chronic respiratory failure with hypoxia: Secondary | ICD-10-CM | POA: Diagnosis not present

## 2022-06-29 DIAGNOSIS — R627 Adult failure to thrive: Secondary | ICD-10-CM | POA: Diagnosis present

## 2022-06-29 DIAGNOSIS — G250 Essential tremor: Secondary | ICD-10-CM | POA: Diagnosis present

## 2022-06-29 DIAGNOSIS — Z79890 Hormone replacement therapy (postmenopausal): Secondary | ICD-10-CM

## 2022-06-29 DIAGNOSIS — J9601 Acute respiratory failure with hypoxia: Secondary | ICD-10-CM | POA: Diagnosis not present

## 2022-06-29 DIAGNOSIS — Z823 Family history of stroke: Secondary | ICD-10-CM

## 2022-06-29 DIAGNOSIS — Z431 Encounter for attention to gastrostomy: Secondary | ICD-10-CM | POA: Diagnosis not present

## 2022-06-29 LAB — POCT I-STAT 7, (LYTES, BLD GAS, ICA,H+H)
Acid-Base Excess: 9 mmol/L — ABNORMAL HIGH (ref 0.0–2.0)
Acid-Base Excess: 18 mmol/L — ABNORMAL HIGH (ref 0.0–2.0)
Bicarbonate: 42 mmol/L — ABNORMAL HIGH (ref 20.0–28.0)
Bicarbonate: 47.3 mmol/L — ABNORMAL HIGH (ref 20.0–28.0)
Calcium, Ion: 1.21 mmol/L (ref 1.15–1.40)
Calcium, Ion: 1.16 mmol/L (ref 1.15–1.40)
HCT: 43 % (ref 39.0–52.0)
HCT: 25 % — ABNORMAL LOW (ref 39.0–52.0)
Hemoglobin: 14.6 g/dL (ref 13.0–17.0)
Hemoglobin: 8.5 g/dL — ABNORMAL LOW (ref 13.0–17.0)
O2 Saturation: 100 %
O2 Saturation: 99 %
Patient temperature: 98.6
Potassium: 3.9 mmol/L (ref 3.5–5.1)
Potassium: 3.6 mmol/L (ref 3.5–5.1)
Sodium: 151 mmol/L — ABNORMAL HIGH (ref 135–145)
Sodium: 154 mmol/L — ABNORMAL HIGH (ref 135–145)
TCO2: 45 mmol/L — ABNORMAL HIGH (ref 22–32)
TCO2: >50 mmol/L — ABNORMAL HIGH (ref 22–32)
pCO2 arterial: 107.7 mmHg (ref 32–48)
pCO2 arterial: 91.7 mmHg (ref 32–48)
pH, Arterial: 7.199 — CL (ref 7.35–7.45)
pH, Arterial: 7.32 — ABNORMAL LOW (ref 7.35–7.45)
pO2, Arterial: 430 mmHg — ABNORMAL HIGH (ref 83–108)
pO2, Arterial: 153 mmHg — ABNORMAL HIGH (ref 83–108)

## 2022-06-29 LAB — COMPREHENSIVE METABOLIC PANEL
ALT: 37 U/L (ref 0–44)
AST: 24 U/L (ref 15–41)
Albumin: 1.7 g/dL — ABNORMAL LOW (ref 3.5–5.0)
Alkaline Phosphatase: 166 U/L — ABNORMAL HIGH (ref 38–126)
Anion gap: 11 (ref 5–15)
BUN: 37 mg/dL — ABNORMAL HIGH (ref 8–23)
CO2: 41 mmol/L — ABNORMAL HIGH (ref 22–32)
Calcium: 8.7 mg/dL — ABNORMAL LOW (ref 8.9–10.3)
Chloride: 101 mmol/L (ref 98–111)
Creatinine, Ser: 0.42 mg/dL — ABNORMAL LOW (ref 0.61–1.24)
GFR, Estimated: >60 mL/min (ref >60)
Glucose, Bld: 129 mg/dL — ABNORMAL HIGH (ref 70–99)
Potassium: 4.1 mmol/L (ref 3.5–5.1)
Sodium: 153 mmol/L — ABNORMAL HIGH (ref 135–145)
Total Bilirubin: 0.6 mg/dL (ref 0.3–1.2)
Total Protein: 7.4 g/dL (ref 6.5–8.1)

## 2022-06-29 LAB — MRSA NEXT GEN BY PCR, NASAL: MRSA by PCR Next Gen: NOT DETECTED

## 2022-06-29 LAB — URINALYSIS, W/ REFLEX TO CULTURE (INFECTION SUSPECTED)
Bilirubin Urine: NEGATIVE
Glucose, UA: NEGATIVE mg/dL
Hgb urine dipstick: NEGATIVE
Ketones, ur: NEGATIVE mg/dL
Nitrite: NEGATIVE
Protein, ur: NEGATIVE mg/dL
Specific Gravity, Urine: 1.019 (ref 1.005–1.030)
WBC, UA: >50 WBC/hpf (ref 0–5)
pH: 5 (ref 5.0–8.0)

## 2022-06-29 LAB — CBC WITH DIFFERENTIAL/PLATELET
Abs Immature Granulocytes: 0.16 10*3/uL — ABNORMAL HIGH (ref 0.00–0.07)
Basophils Absolute: 0 10*3/uL (ref 0.0–0.1)
Basophils Relative: 0 %
Eosinophils Absolute: 0.1 10*3/uL (ref 0.0–0.5)
Eosinophils Relative: 0 %
HCT: 31.2 % — ABNORMAL LOW (ref 39.0–52.0)
Hemoglobin: 8.7 g/dL — ABNORMAL LOW (ref 13.0–17.0)
Immature Granulocytes: 1 %
Lymphocytes Relative: 1 %
Lymphs Abs: 0.3 10*3/uL — ABNORMAL LOW (ref 0.7–4.0)
MCH: 25.8 pg — ABNORMAL LOW (ref 26.0–34.0)
MCHC: 27.9 g/dL — ABNORMAL LOW (ref 30.0–36.0)
MCV: 92.6 fL (ref 80.0–100.0)
Monocytes Absolute: 0.6 10*3/uL (ref 0.1–1.0)
Monocytes Relative: 3 %
Neutro Abs: 23.7 10*3/uL — ABNORMAL HIGH (ref 1.7–7.7)
Neutrophils Relative %: 95 %
Platelets: 459 10*3/uL — ABNORMAL HIGH (ref 150–400)
RBC: 3.37 MIL/uL — ABNORMAL LOW (ref 4.22–5.81)
RDW: 17.5 % — ABNORMAL HIGH (ref 11.5–15.5)
WBC: 24.9 10*3/uL — ABNORMAL HIGH (ref 4.0–10.5)
nRBC: 0 % (ref 0.0–0.2)

## 2022-06-29 LAB — TROPONIN I (HIGH SENSITIVITY)
Troponin I (High Sensitivity): 46 ng/L — ABNORMAL HIGH (ref <18)
Troponin I (High Sensitivity): 54 ng/L — ABNORMAL HIGH (ref <18)

## 2022-06-29 LAB — GLUCOSE, CAPILLARY
Glucose-Capillary: 138 mg/dL — ABNORMAL HIGH (ref 70–99)
Glucose-Capillary: 133 mg/dL — ABNORMAL HIGH (ref 70–99)
Glucose-Capillary: 135 mg/dL — ABNORMAL HIGH (ref 70–99)

## 2022-06-29 LAB — LACTIC ACID, PLASMA
Lactic Acid, Venous: 1 mmol/L (ref 0.5–1.9)
Lactic Acid, Venous: 0.8 mmol/L (ref 0.5–1.9)
Lactic Acid, Venous: 3.1 mmol/L (ref 0.5–1.9)
Lactic Acid, Venous: 0.6 mmol/L (ref 0.5–1.9)

## 2022-06-29 LAB — PROTIME-INR
INR: 1.3 — ABNORMAL HIGH (ref 0.8–1.2)
Prothrombin Time: 16.8 seconds — ABNORMAL HIGH (ref 11.4–15.2)

## 2022-06-29 LAB — BRAIN NATRIURETIC PEPTIDE: B Natriuretic Peptide: 438.6 pg/mL — ABNORMAL HIGH (ref 0.0–100.0)

## 2022-06-29 LAB — APTT: aPTT: 21 seconds — ABNORMAL LOW (ref 24–36)

## 2022-06-29 LAB — RESP PANEL BY RT-PCR (RSV, FLU A&B, COVID)  RVPGX2
Influenza A by PCR: NEGATIVE
Influenza B by PCR: NEGATIVE
Resp Syncytial Virus by PCR: NEGATIVE
SARS Coronavirus 2 by RT PCR: NEGATIVE

## 2022-06-29 LAB — LIPASE, BLOOD: Lipase: 28 U/L (ref 11–51)

## 2022-06-29 LAB — PROCALCITONIN: Procalcitonin: 0.53 ng/mL

## 2022-06-29 LAB — ECHOCARDIOGRAM COMPLETE: Weight: 1967.98 oz

## 2022-06-29 MED ORDER — PHENYLEPHRINE HCL-NACL 20-0.9 MG/250ML-% IV SOLN
25.0000 ug/min | INTRAVENOUS | Status: DC
  Administered 2022-06-29: 100 ug/min via INTRAVENOUS
  Filled 2022-06-29: qty 250

## 2022-06-29 MED ORDER — FENTANYL CITRATE PF 50 MCG/ML IJ SOSY: 100.0000 ug | PREFILLED_SYRINGE | Freq: Once | INTRAMUSCULAR | Status: AC

## 2022-06-29 MED ORDER — LINEZOLID 600 MG/300ML IV SOLN
600.0000 mg | Freq: Two times a day (BID) | INTRAVENOUS | Status: DC
  Administered 2022-06-30 – 2022-07-01 (×3): 600 mg via INTRAVENOUS
  Filled 2022-06-29 (×4): qty 300

## 2022-06-29 MED ORDER — FENTANYL 2500MCG IN NS 250ML (10MCG/ML) PREMIX INFUSION
25.0000 ug/h | INTRAVENOUS | Status: DC
  Administered 2022-06-29: 25 ug/h via INTRAVENOUS
  Filled 2022-06-29: qty 250

## 2022-06-29 MED ORDER — SODIUM CHLORIDE 0.9 % IV SOLN: 250.0000 mL | INTRAVENOUS | Status: DC

## 2022-06-29 MED ORDER — PROPOFOL 1000 MG/100ML IV EMUL
0.0000 ug/kg/min | INTRAVENOUS | Status: DC
  Administered 2022-06-29: 5 ug/kg/min via INTRAVENOUS
  Filled 2022-06-29: qty 100

## 2022-06-29 MED ORDER — FENTANYL CITRATE PF 50 MCG/ML IJ SOSY: 25.0000 ug | PREFILLED_SYRINGE | Freq: Once | INTRAMUSCULAR | Status: DC

## 2022-06-29 MED ORDER — DOCUSATE SODIUM 100 MG PO CAPS: 100.0000 mg | ORAL_CAPSULE | Freq: Two times a day (BID) | ORAL | Status: DC | PRN

## 2022-06-29 MED ORDER — POLYETHYLENE GLYCOL 3350 17 G PO PACK: 17.0000 g | PACK | Freq: Every day | ORAL | Status: DC | PRN

## 2022-06-29 MED ORDER — ALBUMIN HUMAN 25 % IV SOLN
25.0000 g | Freq: Four times a day (QID) | INTRAVENOUS | Status: AC
  Administered 2022-06-30 (×3): 25 g via INTRAVENOUS
  Filled 2022-06-29 (×4): qty 100

## 2022-06-29 MED ORDER — FENTANYL CITRATE PF 50 MCG/ML IJ SOSY
PREFILLED_SYRINGE | INTRAMUSCULAR | Status: AC
  Filled 2022-06-29: qty 1

## 2022-06-29 MED ORDER — EPINEPHRINE 1 MG/10ML IJ SOSY
PREFILLED_SYRINGE | INTRAMUSCULAR | Status: AC | PRN
  Administered 2022-06-29: 1 mg via INTRAVENOUS

## 2022-06-29 MED ORDER — FAMOTIDINE 20 MG PO TABS
20.0000 mg | ORAL_TABLET | Freq: Two times a day (BID) | ORAL | Status: DC
  Administered 2022-06-29 – 2022-06-30 (×3): 20 mg
  Filled 2022-06-29 (×3): qty 1

## 2022-06-29 MED ORDER — SODIUM CHLORIDE 0.9 % IV SOLN
1.0000 g | Freq: Three times a day (TID) | INTRAVENOUS | Status: AC
  Administered 2022-06-29 – 2022-07-04 (×15): 1 g via INTRAVENOUS
  Filled 2022-06-29 (×16): qty 20

## 2022-06-29 MED ORDER — LACTATED RINGERS IV SOLN: INTRAVENOUS | Status: DC

## 2022-06-29 MED ORDER — AMIODARONE LOAD VIA INFUSION
150.0000 mg | Freq: Once | INTRAVENOUS | Status: AC
  Administered 2022-06-29: 150 mg via INTRAVENOUS
  Filled 2022-06-29: qty 83.34

## 2022-06-29 MED ORDER — DOCUSATE SODIUM 50 MG/5ML PO LIQD
100.0000 mg | Freq: Two times a day (BID) | ORAL | Status: DC
  Administered 2022-06-29 – 2022-07-04 (×9): 100 mg
  Filled 2022-06-29 (×10): qty 10

## 2022-06-29 MED ORDER — SODIUM CHLORIDE 0.9 % IV BOLUS (SEPSIS)
1000.0000 mL | Freq: Once | INTRAVENOUS | Status: AC
  Administered 2022-06-29: 1000 mL via INTRAVENOUS

## 2022-06-29 MED ORDER — AMIODARONE HCL IN DEXTROSE 360-4.14 MG/200ML-% IV SOLN
60.0000 mg/h | INTRAVENOUS | Status: DC
  Administered 2022-06-29: 60 mg/h via INTRAVENOUS
  Filled 2022-06-29 (×2): qty 200

## 2022-06-29 MED ORDER — SODIUM BICARBONATE 8.4 % IV SOLN
INTRAVENOUS | Status: AC
  Administered 2022-06-29: 100 meq via INTRAVENOUS
  Filled 2022-06-29: qty 100

## 2022-06-29 MED ORDER — DIATRIZOATE MEGLUMINE & SODIUM 66-10 % PO SOLN
ORAL | Status: AC
  Filled 2022-06-29: qty 30

## 2022-06-29 MED ORDER — HEPARIN SODIUM (PORCINE) 5000 UNIT/ML IJ SOLN: 5000.0000 [IU] | Freq: Three times a day (TID) | INTRAMUSCULAR | Status: DC

## 2022-06-29 MED ORDER — FENTANYL BOLUS VIA INFUSION
25.0000 ug | INTRAVENOUS | Status: DC | PRN
  Administered 2022-06-30: 50 ug via INTRAVENOUS

## 2022-06-29 MED ORDER — DIATRIZOATE MEGLUMINE & SODIUM 66-10 % PO SOLN
30.0000 mL | Freq: Once | ORAL | Status: AC
  Administered 2022-06-29: 30 mL
  Filled 2022-06-29: qty 30

## 2022-06-29 MED ORDER — AMIODARONE HCL IN DEXTROSE 360-4.14 MG/200ML-% IV SOLN
30.0000 mg/h | INTRAVENOUS | Status: DC
  Administered 2022-06-29 – 2022-07-01 (×4): 30 mg/h via INTRAVENOUS
  Filled 2022-06-29 (×3): qty 200

## 2022-06-29 MED ORDER — INSULIN ASPART 100 UNIT/ML IJ SOLN
0.0000 [IU] | INTRAMUSCULAR | Status: DC
  Administered 2022-06-29 – 2022-06-30 (×2): 1 [IU] via SUBCUTANEOUS
  Administered 2022-06-30: 2 [IU] via SUBCUTANEOUS
  Administered 2022-06-30: 1 [IU] via SUBCUTANEOUS

## 2022-06-29 MED ORDER — SODIUM CHLORIDE 0.9 % IV SOLN
250.0000 mL | INTRAVENOUS | Status: DC
  Administered 2022-06-29: 250 mL via INTRAVENOUS

## 2022-06-29 MED ORDER — SODIUM BICARBONATE 8.4 % IV SOLN: 100.0000 meq | Freq: Once | INTRAVENOUS | Status: AC

## 2022-06-29 MED ORDER — FENTANYL CITRATE PF 50 MCG/ML IJ SOSY
50.0000 ug | PREFILLED_SYRINGE | Freq: Once | INTRAMUSCULAR | Status: AC
  Administered 2022-06-29: 50 ug via INTRAVENOUS

## 2022-06-29 MED ORDER — NOREPINEPHRINE 4 MG/250ML-% IV SOLN
0.0000 ug/min | INTRAVENOUS | Status: DC
  Administered 2022-06-29 – 2022-06-30 (×2): 10 ug/min via INTRAVENOUS
  Filled 2022-06-29 (×2): qty 250

## 2022-06-29 MED ORDER — HEPARIN (PORCINE) 25000 UT/250ML-% IV SOLN
1200.0000 [IU]/h | INTRAVENOUS | Status: DC
  Administered 2022-06-29: 800 [IU]/h via INTRAVENOUS
  Filled 2022-06-29: qty 250

## 2022-06-29 MED ORDER — MAGNESIUM SULFATE 2 GM/50ML IV SOLN
INTRAVENOUS | Status: AC
  Administered 2022-06-29: 2 g via INTRAVENOUS
  Filled 2022-06-29: qty 50

## 2022-06-29 MED ORDER — FENTANYL CITRATE PF 50 MCG/ML IJ SOSY
PREFILLED_SYRINGE | INTRAMUSCULAR | Status: AC
  Administered 2022-06-29: 100 ug
  Filled 2022-06-29: qty 2

## 2022-06-29 MED ORDER — VANCOMYCIN HCL 1250 MG/250ML IV SOLN
1250.0000 mg | Freq: Once | INTRAVENOUS | Status: DC
  Administered 2022-06-29: 1250 mg via INTRAVENOUS
  Filled 2022-06-29: qty 250

## 2022-06-29 MED ORDER — VANCOMYCIN HCL IN DEXTROSE 1-5 GM/200ML-% IV SOLN: 1000.0000 mg | Freq: Once | INTRAVENOUS | Status: DC

## 2022-06-29 MED ORDER — SODIUM CHLORIDE 0.9 % IV SOLN
1.0000 g | Freq: Once | INTRAVENOUS | Status: AC
  Administered 2022-06-29: 1 g via INTRAVENOUS
  Filled 2022-06-29: qty 20

## 2022-06-29 MED ORDER — POLYETHYLENE GLYCOL 3350 17 G PO PACK
17.0000 g | PACK | Freq: Every day | ORAL | Status: DC
  Administered 2022-06-30 – 2022-07-04 (×2): 17 g
  Filled 2022-06-29 (×3): qty 1

## 2022-06-29 MED ORDER — MAGNESIUM SULFATE 2 GM/50ML IV SOLN
2.0000 g | Freq: Once | INTRAVENOUS | Status: AC
  Filled 2022-06-29: qty 50

## 2022-06-29 MED ORDER — VANCOMYCIN HCL 750 MG/150ML IV SOLN: 750.0000 mg | INTRAVENOUS | Status: DC

## 2022-06-29 NOTE — ED Notes (Signed)
RN notified EDP about pt SBP trending in the 80's. EDP approved 500 LR bolus. RN has Statistician critical care team.

## 2022-06-29 NOTE — H&P (Signed)
NAME:  Austin Davis, MRN:  [id number], DOB:  March 14, 1940, LOS: 0 ADMISSION DATE:  06/29/2022, CONSULTATION DATE:  06/29/22  REFERRING MD:  MD Elpidio Anis CHIEF COMPLAINT:  Hypotension   History of Present Illness:  Pt with a significant past medical history of CHF, CAD, Paroxysmal afib, Mitral regurg, Mitral valve prolapse s/p repair, seizure disorder, and Parkinson's disease, trach and peg, and recurrent sepsis with multidrug-resistant organisms (ESBL, Pseudomonas, Klebsiella, and Enterococcus), hx of GI bleeds, hypothyroidism and resides at [facility] presented with complaints of hypotension. Per report from [facility], patient was known to have a HR of 160 on the night of 6/8 and received 5mg  of metoprolol IV x2, which subsequently decreased the patient's SBP to the 70s. Patient received 1L of NS and 500 mls of LR in route from [facility] to [facility] ED. PCCM was consulted for further care and management.   Pertinent  Medical History   Past Medical History:  Diagnosis Date   CHF (congestive heart failure) (HCC)    Coronary artery disease    Essential tremor    GI bleed 2019   hospitalized at [facility] for one week   Headache    since childhood   Low blood sugar    since childhood, controlled by diet   Mitral regurgitation    Mitral valve prolapse    Osteopenia 2021   Paroxysmal atrial fibrillation (HCC)    Seizure-like activity (HCC)    Right gaze preference at baseline, essential termor at baseline, intermittent reduced responsiveness secondary to toxic/metabolic processes and poor cognitive reserve   Tremor      Significant Hospital Events: Including procedures, antibiotic start and stop dates in addition to other pertinent events   6/9 Admitted from [facility] with hypotension, concerns of recurrent sepsis, on trach/vent   Interim History / Subjective:  Tremors, non verbal, eyes open/not following commands   Objective   Blood pressure (!) 138/99, pulse (!) 114, temperature (!) 97.4 F  (36.3 C), temperature source Oral, resp. rate 18, height 5\' 10"  (1.778 m), weight 55.8 kg, SpO2 100 %.    Vent Mode: PCV FiO2 (%):  [50 %] 50 % Set Rate:  [18 bmp] 18 bmp PEEP:  [8 cmH20] 8 cmH20  No intake or output data in the 24 hours ending 06/29/22 1504 Filed Weights   06/29/22 1317  Weight: 55.8 kg    Examination: General: chronically ill appearing older adult, lying on ED stretcher  HENT: Normocephalic, dry MM, trach in place, missing teeth  Cardio: s1, s2, auscultated, RRR, no m/r/g Pulm: Diminished throughout, no respiratory distress, trach/vent  Abd: BS active, PEG tube with bile drainage around site  GU: Condom cath, intact  Skin: warm, dry, pressure injury stage 1 on coccyx  Extremities: generalized non pitting edema Neuro: eyes open, tremors (baseline), not following commands   Resolved Hospital Problem list   N/a   Assessment & Plan:  Sepsis  Hx multidrug-resistant organisms (ESBL, Pseudomonas, Klebsiella, and Enterococcus) Suspecting possible recurrent UTI  Hx of  Waiting on UA/culture  Pending BC  WBC 24.9, left band shift  Lactic 1.0  P:  Continue LR at 150ml/hr, MAP Goal >65  If becomes hypotensive will add peripheral vasopressors (neosyn) Follow up on Procal, UA with culture, and BC  Admit to ICU, trach vent dependent Continuous Cardiac Monitoring and VS monitoring per ICU protocol  Monitor Strict I/O  Continue home dose midodrine  Utilize Linezolid and Meropenem   Acute Metabolic Enceph secondary to Sepsis Seizure Disorder  Parkinson's Disease  P: Continue Keppra and valproic acid Neuro-protective measures Seizure precautions  Delirium precautions   Trach/Vent Dependent  Shiley XLT Distal 6mm cuffed  Acute Hypoxic / Hypercapnic Respiratory Failure  P: Continue ventilator support with lung protective strategies  Wean PEEP and FiO2 for sats greater than 90%. Head of bed elevated 30 degrees. Plateau pressures less than 30 cm H20.  Follow  intermittent chest x-ray and ABG.   Ensure adequate pulmonary hygiene    CHF CAD Paroxysmal afib Currently Sinus tach  P: Monitor on Cardiac Tele continuously  Start on sbq heparin  Hold lasix Hold Metoprolol and propanolol  STAT ECHO   Hypothyroidism  P:  Continue synthroid IV   Severe Malnutrition on Chronic Tube feeds PEG tube leaking around site P:  IR consult placed, hold tube feeds until IR evals   Pressure Injury:  Stage 1 coccyx P:  Continue off loading sacrum Utilize foam dressing  Supportive wound care    Best Practice (right click and "Reselect all SmartList Selections" daily)   Diet/type: NPO, restart tube feeds 6/10  DVT prophylaxis: prophylactic heparin  GI prophylaxis: PPI Lines: N/A Foley:  N/A Code Status:  full code Last date of multidisciplinary goals of care discussion [pt non verbal, will update contacts]   Labs   CBC: Recent Labs  Lab 06/29/22 1332  WBC 24.9*  NEUTROABS 23.7*  HGB 8.7*  HCT 31.2*  MCV 92.6  PLT 459*    Basic Metabolic Panel: Recent Labs  Lab 06/29/22 1332  NA 153*  K 4.1  CL 101  CO2 41*  GLUCOSE 129*  BUN 37*  CREATININE 0.42*  CALCIUM 8.7*   GFR: Estimated Creatinine Clearance: 57.2 mL/min (A) (by C-G formula based on SCr of 0.42 mg/dL (L)). Recent Labs  Lab 06/29/22 1332 06/29/22 1334  WBC 24.9*  --   LATICACIDVEN  --  1.0    Liver Function Tests: Recent Labs  Lab 06/29/22 1332  AST 24  ALT 37  ALKPHOS 166*  BILITOT 0.6  PROT 7.4  ALBUMIN 1.7*   Recent Labs  Lab 06/29/22 1332  LIPASE 28   No results for input(s): "AMMONIA" in the last 168 hours.  ABG    Component Value Date/Time   PHART 7.456 (H) 02/27/2022 0325   PCO2ART 61.9 (H) 02/27/2022 0325   PO2ART 61 (L) 02/27/2022 0325   HCO3 47.1 (H) 05/19/2022 1957   TCO2 49 (H) 05/19/2022 1957   ACIDBASEDEF 2.0 05/30/2021 0052   O2SAT 65 05/19/2022 1957     Coagulation Profile: No results for input(s): "INR", "PROTIME" in  the last 168 hours.  Cardiac Enzymes: No results for input(s): "CKTOTAL", "CKMB", "CKMBINDEX", "TROPONINI" in the last 168 hours.  HbA1C: Hgb A1c MFr Bld  Date/Time Value Ref Range Status  01/04/2022 11:22 PM 5.3 4.8 - 5.6 % Final    Comment:    (NOTE)         Prediabetes: 5.7 - 6.4         Diabetes: >6.4         Glycemic control for adults with diabetes: <7.0   04/11/2021 09:12 PM 5.5 4.8 - 5.6 % Final    Comment:    (NOTE) Pre diabetes:          5.7%-6.4%  Diabetes:              >6.4%  Glycemic control for   <7.0% adults with diabetes     CBG: No results for input(s): "GLUCAP"  in the last 168 hours.  Review of Systems:   UTA, patient nonverbal, on trach/vent   Past Medical History:  He,  has a past medical history of CHF (congestive heart failure) (HCC), Coronary artery disease, Essential tremor, GI bleed (2019), Headache, Low blood sugar, Mitral regurgitation, Mitral valve prolapse, Osteopenia (2021), Paroxysmal atrial fibrillation (HCC), Seizure-like activity (HCC), and Tremor.   Surgical History:   Past Surgical History:  Procedure Laterality Date   COLONOSCOPY WITH PROPOFOL N/A 02/19/2017   Procedure: COLONOSCOPY WITH PROPOFOL;  Surgeon: Charlott Rakes, MD;  Location: [facility];  Service: Endoscopy;  Laterality: N/A;   IR GASTROSTOMY TUBE MOD SED  10/18/2020   IR IVC FILTER PLMT / S&I /IMG GUID/MOD SED  11/01/2020   IR REPLACE G-TUBE SIMPLE WO FLUORO  03/15/2022   laser eye surgery for retina detachment     MITRAL VALVE REPAIR  01/2003   monitor  02/05/2006   polyp removal     TONSILLECTOMY     tooth removal     as a teenager   TRACHEOSTOMY TUBE PLACEMENT N/A 10/26/2020   Procedure: TRACHEOSTOMY;  Surgeon: Drema Halon, MD;  Location[facility];  Service: ENT;  Laterality: N/A;     Social History:   reports that he has never smoked. He has never used smokeless tobacco. He reports current alcohol use. He reports that he does not use drugs.   Family  History:  His family history includes Asthma in his daughter; CAD in his mother; Epilepsy in his son; Heart disease in his mother; Heart failure in his father, maternal grandmother, and paternal grandmother; Migraines in his mother; Pneumonia in his maternal grandfather; Skin cancer in his father; Stroke in his paternal grandfather; Tremor in his father; Valvular heart disease in his father. There is no history of Parkinson's disease.   Allergies Allergies  Allergen Reactions   Prednisone Other (See Comments)    Documented on MAR Unknown reaction   Cortisone Other (See Comments)    Documented on MAR Unknown reaction     Home Medications  Prior to Admission medications   Medication Sig Start Date End Date Taking? Authorizing Provider  acetaminophen (TYLENOL) 325 MG tablet Place 650 mg into feeding tube every 8 (eight) hours as needed for fever, headache or moderate pain.    [provider]  ALPRAZolam Prudy Feeler) 0.5 MG tablet Place 0.5 mg into feeding tube every 8 (eight) hours as needed for anxiety.    [provider]  esomeprazole (NEXIUM) 40 MG capsule 40 mg See admin instructions. 40 mg via feeding tube once daily.    [provider]  fluticasone (FLONASE) 50 MCG/ACT nasal spray Place 1 spray into both nostrils every 6 (six) hours as needed for allergies.    [provider]  folic acid (FOLVITE) 1 MG tablet Take 1 tablet (1 mg total) by mouth daily. Patient taking differently: Place 1 mg into feeding tube daily. 09/29/20   Azucena Fallen, MD  furosemide (LASIX) 20 MG tablet Place 1 tablet (20 mg total) into feeding tube daily as needed for fluid or edema. 07/03/21   Almon Hercules, MD  hydrOXYzine (ATARAX) 25 MG tablet Place 25 mg into feeding tube every 6 (six) hours as needed for anxiety. 11/01/21   [provider]  hydrOXYzine (ATARAX) 50 MG tablet Place 50 mg into feeding tube every 6 (six) hours.    [provider]  Lactobacillus  Rhamnosus, GG, (CULTURELLE PO) Place 1 capsule into feeding tube  daily.    [provider]  lactulose (CHRONULAC) 10 GM/15ML solution Place 20 g into feeding tube every 12 (twelve) hours as needed (constipation).    [provider]  levalbuterol Pauline Aus) 1.25 MG/3ML nebulizer solution Take 1.25 mg by nebulization every 6 (six) hours.    [provider]  levothyroxine (SYNTHROID) 25 MCG tablet Place 25 mcg into feeding tube every morning.    [provider]  melatonin 3 MG TABS tablet Place 6 mg into feeding tube at bedtime.    [provider]  metoprolol tartrate (LOPRESSOR) 25 MG tablet Place 25 mg into feeding tube every 12 (twelve) hours.    [provider]  midodrine (PROAMATINE) 10 MG tablet Place 1 tablet (10 mg total) into feeding tube 3 (three) times daily with meals. 05/01/21   Leroy Sea, MD  Multiple Vitamins-Iron (MULTIVITAMINS WITH IRON) TABS tablet 1 tablet See admin instructions. 1 tablet via feeding tube once daily.    [provider]  Nutritional Supplements (FEEDING SUPPLEMENT, OSMOLITE 1.5 CAL,) LIQD Place 60 mL/hr into feeding tube continuous.    [provider]  nystatin (MYCOSTATIN/NYSTOP) powder Apply 1 Application topically 3 (three) times daily. Until redness improves 06/12/22   Mesner, Barbara Cower, MD  Omega-3 Fatty Acids (FISH OIL) 1000 MG CAPS Place 1,000 mg into feeding tube every morning.    [provider]  polyethylene glycol (MIRALAX / GLYCOLAX) 17 g packet Place 17 g into feeding tube every other day.    [provider]  propranolol (INDERAL) 10 MG tablet Place 10 mg into feeding tube See admin instructions. 10 mg via feeding tube once daily as needed for tachycardia. Hold if HR < 60 or sBP < 90.    [provider]  sertraline (ZOLOFT) 50 MG tablet Place 50 mg into feeding tube daily.    [provider]  sodium chloride flush (NS) 0.9 % SOLN Inject 10 mLs into  the vein See admin instructions. 2 entries on MAR: 1) Use 10 mL IV every shift for all unused lumens. Flush each unused lumen. 2) Flush with 10 mL of normal saline before medications and 10 mL after medications    [provider]  tamsulosin (FLOMAX) 0.4 MG CAPS capsule 0.4 mg See admin instructions. 0.4 mg via feeding tube twice daily at 0900, 2100.    [provider]    Attending Addendum  06/29/2022  . I have seen and evaluated the patient for sepsis   S:  Chronic vent/PEG due to advanced Parkison's, hx of MDR sepsis presenting from [facility] with tachycardia and hypotension.  Workup has revealed probable infection with potential sources being UTI vs. PEG site infection.  PCCM consulted for admission.  Patient is nonverbal.   I see MDR e coli, VRE, pan sensitive pseudomonas on recent cultures.   O: Blood pressure 96/66, pulse 64, temperature 97.6 F (36.4 C), temperature source Oral, resp. rate 17, height 5\' 3"  (1.6 m), weight 81.6 kg, SpO2 94 %.    Chronically ill man laying in bed Distal XLT shiley in place with minimal secretions Rhythmic mouth movement chronic Lungs with scattered rhonci PEG with what looks like gastric secretions coming out from around it Stage I decubitus ulcer on back Not following commands +muscle wasting +anasarca   Labs notable for hypoalbuminemia, AKI, hypernatremia, leukocytosis with left shift   Lactate suprisingly normal x 2   CXR with really big trach balloon size   A:  Septic shock secondary to either  UTI or less likely PEG tube infection   Acute kidney injury  Severe muscle wasting and protein calorie malnutrition POA    Hypernatremia   Hx Parksinon's, essential tremor, FTT now vent/PEG dependent   Possible PEG malfunction   P:  - Vent bundle - Based on prior culture data, will need linezolid/meropenem empiric - Crystalloid/albumin as ordered - Levophed for MAP 65  - IR to take a look at PEG site question if needs  exchange or pursestring?; recent issues with balloon positioning - Will have RT check cuff pressures   My cc time 31 min   Myrla Halsted MD [facility] Prefer epic messenger for cross cover needs If after hours, please call E-link       Please see subsequent notes for events after ER eval.  Myrla Halsted MD PCCM

## 2022-06-29 NOTE — ED Provider Notes (Signed)
Austin Davis   CSN: Davis Arrival date & time: 06/29/22  1242     History  Chief Complaint  Patient presents with   tachy/ hypotension   Hypotension    Austin Davis is a 82 y.o. male.  With PMH of Parkinson's, frequent UTIs, CHF, trach and PEG dependent brought in by EMS from [facility] for tachycardia and hypotension.  According to EMS, he was tachycardic to the 160s last night given 2 doses of IV metoprolol 5 mg and then proceeded to have hypotension with systolic in the 70s.  Given 1500 cc IV fluids with EMS.  Patient unable to provide me any history.  He is chronically ill in appearance and has paperwork stating full code.  HPI     Home Medications Prior to Admission medications   Medication Sig Start Date End Date Taking? Authorizing Provider  acetaminophen (TYLENOL) 325 MG tablet Place 650 mg into feeding tube every 8 (eight) hours as needed for fever, headache or moderate pain.    [provider]  ALPRAZolam Prudy Feeler) 0.5 MG tablet Place 0.5 mg into feeding tube every 8 (eight) hours as needed for anxiety.    [provider]  esomeprazole (NEXIUM) 40 MG capsule 40 mg See admin instructions. 40 mg via feeding tube once daily.    [provider]  fluticasone (FLONASE) 50 MCG/ACT nasal spray Place 1 spray into both nostrils every 6 (six) hours as needed for allergies.    [provider]  folic acid (FOLVITE) 1 MG tablet Take 1 tablet (1 mg total) by mouth daily. Patient taking differently: Place 1 mg into feeding tube daily. 09/29/20   Azucena Fallen, MD  furosemide (LASIX) 20 MG tablet Place 1 tablet (20 mg total) into feeding tube daily as needed for fluid or edema. 07/03/21   Almon Hercules, MD  hydrOXYzine (ATARAX) 25 MG tablet Place 25 mg into feeding tube every 6 (six) hours as needed for anxiety. 11/01/21   [provider]  hydrOXYzine (ATARAX) 50 MG tablet Place 50  mg into feeding tube every 6 (six) hours.    [provider]  Lactobacillus Rhamnosus, GG, (CULTURELLE PO) Place 1 capsule into feeding tube daily.    [provider]  lactulose (CHRONULAC) 10 GM/15ML solution Place 20 g into feeding tube every 12 (twelve) hours as needed (constipation).    [provider]  levalbuterol Pauline Aus) 1.25 MG/3ML nebulizer solution Take 1.25 mg by nebulization every 6 (six) hours.    [provider]  levothyroxine (SYNTHROID) 25 MCG tablet Place 25 mcg into feeding tube every morning.    [provider]  melatonin 3 MG TABS tablet Place 6 mg into feeding tube at bedtime.    [provider]  metoprolol tartrate (LOPRESSOR) 25 MG tablet Place 25 mg into feeding tube every 12 (twelve) hours.    [provider]  midodrine (PROAMATINE) 10 MG tablet Place 1 tablet (10 mg total) into feeding tube 3 (three) times daily with meals. 05/01/21   Leroy Sea, MD  Multiple Vitamins-Iron (MULTIVITAMINS WITH IRON) TABS tablet 1 tablet See admin instructions. 1 tablet via feeding tube once daily.    [provider]  Nutritional Supplements (FEEDING SUPPLEMENT, OSMOLITE 1.5 CAL,) LIQD Place 60 mL/hr into feeding tube continuous.    [provider]  nystatin (MYCOSTATIN/NYSTOP) powder Apply 1 Application topically 3 (three) times daily. Until redness improves 06/12/22   Mesner, Barbara Cower, MD  Omega-3 Fatty Acids (FISH OIL) 1000 MG CAPS Place 1,000 mg into feeding tube every morning.    [provider]  polyethylene glycol (MIRALAX / GLYCOLAX) 17 g packet Place 17 g into feeding tube every other day.    [provider]  propranolol (INDERAL) 10 MG tablet Place 10 mg into feeding tube See admin instructions. 10 mg via feeding tube once daily as needed for tachycardia. Hold if HR < 60 or sBP < 90.    [provider]  sertraline (ZOLOFT) 50 MG tablet Place 50 mg into feeding tube daily.     [provider]  sodium chloride flush (NS) 0.9 % SOLN Inject 10 mLs into the vein See admin instructions. 2 entries on MAR: 1) Use 10 mL IV every shift for all unused lumens. Flush each unused lumen. 2) Flush with 10 mL of normal saline before medications and 10 mL after medications    [provider]  tamsulosin (FLOMAX) 0.4 MG CAPS capsule 0.4 mg See admin instructions. 0.4 mg via feeding tube twice daily at 0900, 2100.    [provider]      Allergies    Prednisone and Cortisone    Review of Systems   Review of Systems  Physical Exam Updated Vital Signs BP (!) 138/99 (BP Location: Left Arm)   Pulse (!) 114   Temp (!) 97.4 F (36.3 C) (Oral)   Resp 18   Ht 5\' 10"  (1.778 m)   Wt 55.8 kg   SpO2 100%   BMI 17.65 kg/m  Physical Exam Constitutional: Chronically ill trach and PEG nonverbal Eyes: Conjunctivae are normal. ENT      Head: Normocephalic and atraumatic.      Neck: Tracheostomy in place, no surrounding erythema or discharge on vent Cardiovascular: Tachycardic, distal extremities cool and dry, palpable bilateral radial pulses Respiratory: Ventilator dependent/trach dependent.  Rhonchorous breath sounds bilaterally.  100% on 50% FiO2 Gastrointestinal: Soft, thin, PEG tube in place with surrounding erythema and purulent discharge/.  No indwelling Foley catheter.  No wounds of sacral decubitus region. Musculoskeletal: Contractures of extremities Neurologic: Nonverbal, eyes open, not following commands, contractures present Skin: Skin is cool dry, cellulitic changes at peg site Psychiatric: nonverbal  ED Results / Procedures / Treatments   Labs (all labs ordered are listed, but only abnormal results are displayed) Labs Reviewed  COMPREHENSIVE METABOLIC PANEL - Abnormal; Notable for the following components:      Result Value   Sodium 153 (*)    CO2 41 (*)    Glucose, Bld 129 (*)    BUN 37 (*)    Creatinine, Ser 0.42 (*)    Calcium 8.7  (*)    Albumin 1.7 (*)    Alkaline Phosphatase 166 (*)    All other components within normal limits  CBC WITH DIFFERENTIAL/PLATELET - Abnormal; Notable for the following components:   WBC 24.9 (*)    RBC 3.37 (*)    Hemoglobin 8.7 (*)    HCT 31.2 (*)    MCH 25.8 (*)    MCHC 27.9 (*)    RDW 17.5 (*)    Platelets 459 (*)    Neutro Abs 23.7 (*)    Lymphs Abs 0.3 (*)    Abs Immature Granulocytes 0.16 (*)    All other components within normal limits  BRAIN NATRIURETIC PEPTIDE - Abnormal; Notable for the following components:   B Natriuretic Peptide 438.6 (*)    All other components within normal limits  RESP PANEL BY RT-PCR (RSV, FLU A&B, COVID)  RVPGX2  CULTURE, BLOOD (ROUTINE X 2)  CULTURE, BLOOD (ROUTINE X 2)  LACTIC ACID, PLASMA  LIPASE, BLOOD  LACTIC ACID, PLASMA  URINALYSIS, W/ REFLEX TO CULTURE (INFECTION SUSPECTED)    EKG EKG Interpretation  Date/Time:  Sunday June 29 2022 12:51:05 EDT Ventricular Rate:  133 PR Interval:  130 QRS Duration: 102 QT Interval:  319 QTC Calculation: 475 R Axis:   73 Text Interpretation: Sinus tachycardia Confirmed by Vivien Rossetti ([id number]) on 06/29/2022 1:24:09 PM  Radiology DG Chest Port 1 View  Result Date: 06/29/2022 CLINICAL DATA:  Questionable sepsis. EXAM: PORTABLE CHEST 1 VIEW COMPARISON:  Chest x-ray 05/19/2022 and older FINDINGS: Stable changes of median sternotomy. Tracheostomy tube. Once again the tracheostomy balloon appears to be larger than usually seen. Normal cardiopericardial silhouette with tortuous and ectatic aorta. Diffuse interstitial changes are again noted to the lungs with a tiny right effusion. No pneumothorax. Increasing interstitial changes. Overlapping cardiac leads. IMPRESSION: Increasing interstitial changes of the lungs. Acute process is possible. Tiny right effusion. Recommend short follow-up Postop chest. Tracheostomy tube in place. Once again the tracheostomy balloon appears larger than usually seen. Please  correlate for level of inflation. Electronically Signed   By: Karen Kays M.D.   On: 06/29/2022 13:22    Procedures .Critical Care  Performed by: Mardene Sayer, MD Authorized by: Mardene Sayer, MD   Critical care provider statement:    Critical care time (minutes):  45   Critical care was necessary to treat or prevent imminent or life-threatening deterioration of the following conditions:  Sepsis, cardiac failure and respiratory failure   Critical care was time spent personally by me on the following activities:  Development of treatment plan with patient or surrogate, evaluation of patient's response to treatment, examination of patient, ordering and review of laboratory studies, ordering and review of radiographic studies, ordering and performing treatments and interventions, pulse oximetry, re-evaluation of patient's condition, review of old charts and obtaining history from patient or surrogate   Care discussed with: admitting provider       Medications Ordered in ED Medications  lactated ringers infusion (has no administration in time range)  vancomycin (VANCOREADY) IVPB 1250 mg/250 mL (1,250 mg Intravenous New Bag/Given 06/29/22 1506)  vancomycin (VANCOREADY) IVPB 750 mg/150 mL (has no administration in time range)  meropenem (MERREM) 1 g in sodium chloride 0.9 % 100 mL IVPB (has no administration in time range)  sodium chloride 0.9 % bolus 1,000 mL (1,000 mLs Intravenous New Bag/Given 06/29/22 1339)  meropenem (MERREM) 1 g in sodium chloride 0.9 % 100 mL IVPB (0 g Intravenous Stopped 06/29/22 1508)    ED Course/ Medical Decision Making/ A&P Clinical Course as of 06/29/22 1515  Sun Jun 29, 2022  1450 Discussed case with ICU team who will be down to evaluate the patient for admission for concern for sepsis secondary to suspected pneumonia and cellulitis of the abdomen around PEG tube site.  UA still pending.  Lactate is 1 and reassuring. [VB]    Clinical Course User  Index [VB] Mardene Sayer, MD                             Medical Decision Making QUINTAVIS BRANDS is a 82 y.o. male.  With PMH of Parkinson's, frequent UTIs, CHF, trach and PEG dependent brought in by EMS from [facility] for tachycardia and hypotension.  Patient presented  with heart rate in the 140s sinus tachycardia with stable blood pressure.  Has recurrent episodes of sepsis secondary to UTIs, VAP.  He is ventilator dependent on pressure control 50% FiO2.  No vent changes made.   Patient's presentation concerning for sepsis likely secondary to ventilator associated pneumonia, chest x-ray did show increasing interstitial changes personally reviewed by me.  He also has evidence of cellulitis around PEG site with significant erythema and purulent discharge.  Labs notable for a leukocytosis of 24.9 with left shift.  Chronic stable anemia hemoglobin 8.7.  Also concern for new acute hypernatremia 153.  BUN is 37.  Appears dry and not fluid overloaded on exam.  Has received sepsis fluids 1500 cc with EMS another liter here as well as broad-spectrum antibiotics with vancomycin and meropenem based off previous culture results.  He is full code.  Discussed with ICU for admission for management of concern for sepsis as well as acute metabolic derangements.  Amount and/or Complexity of Data Reviewed Labs: ordered. Radiology: ordered. ECG/medicine tests: ordered.  Risk Prescription drug management. Decision regarding hospitalization.     Final Clinical Impression(s) / ED Diagnoses Final diagnoses:  Sepsis, due to unspecified organism, unspecified whether acute organ dysfunction present (HCC)  Pneumonia of both lungs due to infectious organism, unspecified part of lung  Cellulitis of other specified site  Hypernatremia    Rx / DC Orders ED Discharge Orders     None         Mardene Sayer, MD 06/29/22 1515

## 2022-06-29 NOTE — Progress Notes (Signed)
Worsening shock, remains in RVR will cardiovert Guardian called and VM left.  Myrla Halsted MD PCCM

## 2022-06-29 NOTE — Progress Notes (Unsigned)
Stat Echo performed. Prelim report in chart.  Dondra Prader RVT RCS

## 2022-06-29 NOTE — ED Notes (Signed)
CCM notified of 2 minute cardiac arrest, instructed to bring patient to ICU

## 2022-06-29 NOTE — ED Notes (Signed)
Pt has stage 1 pressure sore on sacrum. RN applied sacrum foam.   Pt peg tub has yellow drainage and redness around site. Pt had a towel placed around peg collecting the dark yellow purulent.  EDP is aware   RN and NT provided pericare and changed casing on pillow. Pt is has a pillow underneath right side and will be rotated at 1500

## 2022-06-29 NOTE — Procedures (Addendum)
Central Venous Catheter Insertion Procedure Note  Austin Davis  [id number]  Apr 09, 1940  Date:06/29/22  Time:6:51 PM   Provider Performing:Graysyn Bache D. Harris   Procedure: Insertion of Non-tunneled Central Venous Catheter(36556) with US guidance (65784)   Indication(s) Medication administration  Consent Unable to obtain consent due to emergent nature of procedure.  Anesthesia Topical only with 1% lidocaine   Timeout Verified patient identification, verified procedure, site/side was marked, verified correct patient position, special equipment/implants available, medications/allergies/relevant history reviewed, required imaging and test results available.  Sterile Technique Maximal sterile technique including full sterile barrier drape, hand hygiene, sterile gown, sterile gloves, mask, hair covering, sterile ultrasound probe cover (if used).  Procedure Description Area of catheter insertion was cleaned with chlorhexidine and draped in sterile fashion.  With real-time ultrasound guidance a central venous catheter was placed into the right femoral vein. Nonpulsatile blood flow and easy flushing noted in all ports.  The catheter was sutured in place and sterile dressing applied.  Complications/Tolerance None; patient tolerated the procedure well. Chest X-ray is ordered to verify placement for internal jugular or subclavian cannulation.   Chest x-ray is not ordered for femoral cannulation.  EBL Minimal  Specimen(s) None  Norinne Jeane D. Harris, NP-C [facility] Personal contact information can be found on Amion  If no contact or response made please call [phone] 06/29/2022, 6:52 PM

## 2022-06-29 NOTE — Progress Notes (Signed)
ANTICOAGULATION CONSULT NOTE - Initial Consult  Pharmacy Consult for heparin Indication: atrial fibrillation  Allergies  Allergen Reactions   Prednisone Other (See Comments)    Documented on MAR Unknown reaction   Cortisone Other (See Comments)    Documented on MAR Unknown reaction    Patient Measurements: Height: 5\' 10"  (177.8 cm) Weight: 55.8 kg (123 lb) IBW/kg (Calculated) : 73 Heparin Dosing Weight: 55.8 kg   Vital Signs: Temp: 97.4 F (36.3 C) (06/09 1315) Temp Source: Oral (06/09 1315) BP: 142/76 (06/09 1652) Pulse Rate: 115 (06/09 1645)  Labs: Recent Labs    06/29/22 1332  HGB 8.7*  HCT 31.2*  PLT 459*  CREATININE 0.42*    Estimated Creatinine Clearance: 57.2 mL/min (A) (by C-G formula based on SCr of 0.42 mg/dL (L)).   Medical History: Past Medical History:  Diagnosis Date   CHF (congestive heart failure) (HCC)    Coronary artery disease    Essential tremor    GI bleed 2019   hospitalized at [facility] for one week   Headache    since childhood   Low blood sugar    since childhood, controlled by diet   Mitral regurgitation    Mitral valve prolapse    Osteopenia 2021   Paroxysmal atrial fibrillation (HCC)    Seizure-like activity (HCC)    Right gaze preference at baseline, essential termor at baseline, intermittent reduced responsiveness secondary to toxic/metabolic processes and poor cognitive reserve   Tremor     Medications:  Scheduled:   amiodarone  150 mg Intravenous Once   famotidine  20 mg Per Tube BID   insulin aspart  0-9 Units Subcutaneous Q4H    Assessment: 81 yom presents from [facility] with tachycardia and hypotension complicated with brief arrest in ED - no AC PTA. EKG showing Afib with RVR. Of note patient does have hx GI bleed.   Hgb 8.7, plt 459. No s/sx of bleeding.  Goal of Therapy:  Heparin level 0.3-0.7 units/ml Monitor platelets by anticoagulation protocol: Yes   Plan:  No bolus given hx GI bleed and  discussion with team Start heparin infusion at 800 units/hr  Order heparin level in 8 hours Monitor daily HL, CBC, and for s/sx of bleeding  Thank you for allowing pharmacy to participate in this patient's care,  Sherron Monday, PharmD, BCCCP Clinical Pharmacist  Phone: [phone] 06/29/2022 5:27 PM  Please check AMION for all [facility] Pharmacy phone numbers After 10:00 PM, call Main Pharmacy [phone]

## 2022-06-29 NOTE — Procedures (Signed)
Arterial Catheter Insertion Procedure Note  Austin Davis  [id number]  1940-11-10  Date:06/29/22  Time:6:52 PM    Provider Performing: Alphonzo Lemmings D. Harris    Procedure: Insertion of Arterial Line (40981) with US guidance (19147)   Indication(s) Blood pressure monitoring and/or need for frequent ABGs  Consent Unable to obtain consent due to emergent nature of procedure.  Anesthesia None   Time Out Verified patient identification, verified procedure, site/side was marked, verified correct patient position, special equipment/implants available, medications/allergies/relevant history reviewed, required imaging and test results available.   Sterile Technique Maximal sterile technique including full sterile barrier drape, hand hygiene, sterile gown, sterile gloves, mask, hair covering, sterile ultrasound probe cover (if used).   Procedure Description Area of catheter insertion was cleaned with chlorhexidine and draped in sterile fashion. With real-time ultrasound guidance an arterial catheter was placed into the right femoral artery.  Appropriate arterial tracings confirmed on monitor.     Complications/Tolerance None; patient tolerated the procedure well.   EBL Minimal   Specimen(s) None  Leray Garverick D. Harris, NP-C [facility] Personal contact information can be found on Amion  If no contact or response made please call [phone] 06/29/2022, 6:53 PM

## 2022-06-29 NOTE — Progress Notes (Addendum)
eLink Physician-Brief Progress Note Patient Name: GARION WEMPE DOB: 1940/09/18 MRN: [id number]   Date of Service  06/29/2022  HPI/Events of Note  82 year old male with a history of heart failure, coronary artery disease, paroxysmal A-fib and mitral regurg multidrug-resistant organisms, GI bleeds, and chronic debility with trach/PEG.  He presents from his long-term care hospital with tachycardia, hypotension, and developed brief PEA arrest in the emergency department.  Went into unstable tachyarrhythmia and had cardioversion.  Patient appears uncomfortable, breath stacking and has extremely high pressures.  Switched from [facility] to pressure control.  Remains tachypneic.    eICU Interventions  Will attempt to go up on morphine for analgesia, continue PC ventilation and obtain repeat gas.   2103 -  although respiratory mechanics remain far from ideal, patient appears much more comfortable with analgesia and ABG appears better.  Continue current treatment.  Intervention Category Major Interventions: Respiratory failure - evaluation and management  Ramell Wacha 06/29/2022, 8:18 PM

## 2022-06-29 NOTE — Progress Notes (Signed)
RT note. RT collected abg , all values in critical brackets. Abg sent down to lab, lab called and verified. RT will continue to monitor.  Ok per NP Harris to place patient on PRVC with RR 35.

## 2022-06-29 NOTE — ED Notes (Addendum)
Patient began to brady and became pulseless. Carollee Herter RN in room with radiology and compressions started immediately. MD, RT present and EPI x 1 given. Pulses return at pulse check and CPR discontinued

## 2022-06-29 NOTE — Sepsis Progress Note (Signed)
Sepsis protocol is being followed by [facility]. 

## 2022-06-29 NOTE — ED Notes (Signed)
Pt repositioned in bed. Pillow on right side rotated to left.

## 2022-06-29 NOTE — ED Notes (Signed)
When changing pt brief pt started urinating. RN had NT place pt on condom cath.

## 2022-06-29 NOTE — Progress Notes (Signed)
Pharmacy Antibiotic Note  Austin Davis is a 82 y.o. male admitted on 06/29/2022 with sepsis.  Pharmacy has been consulted for vancomycin and cefepime dosing.  Plan: Give IV Vancomycin 1250mg  x 1 for loading dose, followed by IV Vancomycin 750 every 24 hours for eAUC417. Scr rounded to 0.8, TBW used given < IBW, Vd: 0.72.  Meropenem 1g Q8H.  Follow culture data for de-escalation.  Monitor renal function for dose adjustments as indicated.     Height: 5\' 10"  (177.8 cm) Weight: 55.8 kg (123 lb) IBW/kg (Calculated) : 73  Temp (24hrs), Avg:97.4 F (36.3 C), Min:97.4 F (36.3 C), Max:97.4 F (36.3 C)  Recent Labs  Lab 06/29/22 1332 06/29/22 1334  WBC 24.9*  --   CREATININE 0.42*  --   LATICACIDVEN  --  1.0    Estimated Creatinine Clearance: 57.2 mL/min (A) (by C-G formula based on SCr of 0.42 mg/dL (L)).    Allergies  Allergen Reactions   Prednisone Other (See Comments)    Documented on MAR Unknown reaction   Cortisone Other (See Comments)    Documented on MAR Unknown reaction    Thank you for allowing pharmacy to be a part of this patient's care.  Estill Batten, PharmD, BCCCP  06/29/2022 2:27 PM

## 2022-06-29 NOTE — Procedures (Addendum)
Cardioversion Rochel Brome NP student supervised by Myrla Halsted MD Consent: emergent Anesthesia fent 120 synchronized, sinus rhythm post and improved Bps  Have called daughter and son to come in; technically Austin Davis is medical decision maker.  It's a bit complex.  Myrla Halsted MD PCCM

## 2022-06-29 NOTE — Progress Notes (Addendum)
Brief PEA arrest in ER, unclear precipitant <2 mins before ROSC; following commands now Now afib/RVR and neosynephrine Bedside echo some RV dilation but no McConnell's  - Place central line - LE duplex - CXR, ABG - Heparin/amio drip - Check trops - Family being called by [place], may need to revisit GOC during this hospitalization; may be a ward of state...  Myrla Halsted MD PCCM

## 2022-06-29 NOTE — ED Triage Notes (Signed)
Pt bib [facility] from [facility] c/o hypotensive. Pt was noted to have a HR 160 last night, so pt was given two 5 mg metoprolol that dropped his SBP 74. Pt was given 500 LR and 1L NS with SBP 158.

## 2022-06-29 NOTE — ED Notes (Signed)
RN attempted to call state guardian with no answer.

## 2022-06-29 NOTE — ED Notes (Addendum)
ED TO INPATIENT HANDOFF REPORT  ED Nurse Name and Phone #: Jess Barters [phone]  S Name/Age/Gender Austin Davis 82 y.o. male Room/Bed: 019C/019C  Code Status   Code Status: Full Code  Home/SNF/Other   [facility] Patient oriented to: self Is this baseline? Yes   Triage Complete: Triage complete  Chief Complaint Sepsis [facility]) [A41.9]  Triage Note Pt bib [facility] from [facility] c/o hypotensive. Pt was noted to have a HR 160 last night, so pt was given two 5 mg metoprolol that dropped his SBP 74. Pt was given 500 LR and 1L NS with SBP 158.   Allergies Allergies  Allergen Reactions   Prednisone Other (See Comments)    Documented on MAR Unknown reaction   Cortisone Other (See Comments)    Documented on MAR Unknown reaction    Level of Care/Admitting Diagnosis ED Disposition     ED Disposition  Admit   Condition  --   Comment  Hospital Area: [facility] [[id number]]  Level of Care: ICU [6]  May admit patient to [facility] or [facility] if equivalent level of care is available:: Yes  Covid Evaluation: Asymptomatic - no recent exposure (last 10 days) testing not required  Diagnosis: Sepsis [facility]) [[id number]]  Admitting Physician: Lorin Glass [[id number]]  Attending Physician: Lorin Glass [[id number]]  Certification:: I certify this patient will need inpatient services for at least 2 midnights  Estimated Length of Stay: 4          B Medical/Surgery History Past Medical History:  Diagnosis Date   CHF (congestive heart failure) (HCC)    Coronary artery disease    Essential tremor    GI bleed 2019   hospitalized at [facility] for one week   Headache    since childhood   Low blood sugar    since childhood, controlled by diet   Mitral regurgitation    Mitral valve prolapse    Osteopenia 2021   Paroxysmal atrial fibrillation (HCC)    Seizure-like activity (HCC)    Right gaze preference at baseline, essential termor at  baseline, intermittent reduced responsiveness secondary to toxic/metabolic processes and poor cognitive reserve   Tremor    Past Surgical History:  Procedure Laterality Date   COLONOSCOPY WITH PROPOFOL N/A 02/19/2017   Procedure: COLONOSCOPY WITH PROPOFOL;  Surgeon: Charlott Rakes, MD;  Location: [facility] ENDOSCOPY;  Service: Endoscopy;  Laterality: N/A;   IR GASTROSTOMY TUBE MOD SED  10/18/2020   IR IVC FILTER PLMT / S&I /IMG GUID/MOD SED  11/01/2020   IR REPLACE G-TUBE SIMPLE WO FLUORO  03/15/2022   laser eye surgery for retina detachment     MITRAL VALVE REPAIR  01/2003   monitor  02/05/2006   polyp removal     TONSILLECTOMY     tooth removal     as a teenager   TRACHEOSTOMY TUBE PLACEMENT N/A 10/26/2020   Procedure: TRACHEOSTOMY;  Surgeon: Drema Halon, MD;  Location: [facility] OR;  Service: ENT;  Laterality: N/A;     A IV Location/Drains/Wounds Patient Lines/Drains/Airways Status     Active Line/Drains/Airways     Name Placement date Placement time Site Days   Peripheral IV 06/29/22 20 G Right Antecubital 06/29/22  1337  Antecubital  less than 1   Peripheral IV 06/29/22 24 G Left;Posterior Hand 06/29/22  1328  Hand  less than 1   Peripheral IV 06/29/22 22 G Anterior;Left Forearm 06/29/22  1356  Forearm  less than 1  Gastrostomy/Enterostomy Gastrostomy 24 Fr. 03/15/22  1010  --  106   External Urinary Catheter 06/29/22  1326  --  less than 1   Tracheostomy Shiley XLT Distal 6 mm Cuffed;Distal --  --  6 mm  --   Pressure Injury 02/01/22 Buttocks Left Stage 2 -  Partial thickness loss of dermis presenting as a shallow open injury with a red, pink wound bed without slough. 02/01/22  0640  -- 148            Intake/Output Last 24 hours No intake or output data in the 24 hours ending 06/29/22 1602  Labs/Imaging Results for orders placed or performed during the hospital encounter of 06/29/22 (from the past 48 hour(s))  Resp panel by RT-PCR (RSV, Flu A&B, Covid) Anterior Nasal  Swab     Status: None   Collection Time: 06/29/22  1:23 PM   Specimen: Anterior Nasal Swab  Result Value Ref Range   SARS Coronavirus 2 by RT PCR NEGATIVE NEGATIVE   Influenza A by PCR NEGATIVE NEGATIVE   Influenza B by PCR NEGATIVE NEGATIVE    Comment: (NOTE) The Xpert Xpress SARS-CoV-2/FLU/RSV plus assay is intended as an aid in the diagnosis of influenza from Nasopharyngeal swab specimens and should not be used as a sole basis for treatment. Nasal washings and aspirates are unacceptable for Xpert Xpress SARS-CoV-2/FLU/RSV testing.  Fact Sheet for Patients: BloggerCourse.com  Fact Sheet for Healthcare Providers: SeriousBroker.it  This test is not yet approved or cleared by the [place] FDA and has been authorized for detection and/or diagnosis of SARS-CoV-2 by FDA under an Emergency Use Authorization (EUA). This EUA will remain in effect (meaning this test can be used) for the duration of the COVID-19 declaration under Section 564(b)(1) of the Act, 21 U.S.C. section 360bbb-3(b)(1), unless the authorization is terminated or revoked.     Resp Syncytial Virus by PCR NEGATIVE NEGATIVE    Comment: (NOTE) Fact Sheet for Patients: BloggerCourse.com  Fact Sheet for Healthcare Providers: SeriousBroker.it  This test is not yet approved or cleared by the [place] FDA and has been authorized for detection and/or diagnosis of SARS-CoV-2 by FDA under an Emergency Use Authorization (EUA). This EUA will remain in effect (meaning this test can be used) for the duration of the COVID-19 declaration under Section 564(b)(1) of the Act, 21 U.S.C. section 360bbb-3(b)(1), unless the authorization is terminated or revoked.  Performed at [facility], [address] [place], [place] [address]   Comprehensive metabolic panel     Status: Abnormal   Collection Time: 06/29/22  1:32  PM  Result Value Ref Range   Sodium 153 (H) 135 - 145 mmol/L   Potassium 4.1 3.5 - 5.1 mmol/L   Chloride 101 98 - 111 mmol/L   CO2 41 (H) 22 - 32 mmol/L   Glucose, Bld 129 (H) 70 - 99 mg/dL    Comment: Glucose reference range applies only to samples taken after fasting for at least 8 hours.   BUN 37 (H) 8 - 23 mg/dL   Creatinine, Ser 6.04 (L) 0.61 - 1.24 mg/dL   Calcium 8.7 (L) 8.9 - 10.3 mg/dL   Total Protein 7.4 6.5 - 8.1 g/dL   Albumin 1.7 (L) 3.5 - 5.0 g/dL   AST 24 15 - 41 U/L   ALT 37 0 - 44 U/L   Alkaline Phosphatase 166 (H) 38 - 126 U/L   Total Bilirubin 0.6 0.3 - 1.2 mg/dL   GFR, Estimated >54 >  60 mL/min    Comment: (NOTE) Calculated using the CKD-EPI Creatinine Equation (2021)    Anion gap 11 5 - 15    Comment: Performed at [facility], [address], [place], [place] [address]  CBC with Differential     Status: Abnormal   Collection Time: 06/29/22  1:32 PM  Result Value Ref Range   WBC 24.9 (H) 4.0 - 10.5 K/uL   RBC 3.37 (L) 4.22 - 5.81 MIL/uL   Hemoglobin 8.7 (L) 13.0 - 17.0 g/dL   HCT 95.2 (L) 84.1 - 32.4 %   MCV 92.6 80.0 - 100.0 fL   MCH 25.8 (L) 26.0 - 34.0 pg   MCHC 27.9 (L) 30.0 - 36.0 g/dL   RDW 40.1 (H) 02.7 - 25.3 %   Platelets 459 (H) 150 - 400 K/uL   nRBC 0.0 0.0 - 0.2 %   Neutrophils Relative % 95 %   Neutro Abs 23.7 (H) 1.7 - 7.7 K/uL   Lymphocytes Relative 1 %   Lymphs Abs 0.3 (L) 0.7 - 4.0 K/uL   Monocytes Relative 3 %   Monocytes Absolute 0.6 0.1 - 1.0 K/uL   Eosinophils Relative 0 %   Eosinophils Absolute 0.1 0.0 - 0.5 K/uL   Basophils Relative 0 %   Basophils Absolute 0.0 0.0 - 0.1 K/uL   Immature Granulocytes 1 %   Abs Immature Granulocytes 0.16 (H) 0.00 - 0.07 K/uL    Comment: Performed at [facility], [address], [place], [place] [address]  Lipase, blood     Status: None   Collection Time: 06/29/22  1:32 PM  Result Value Ref Range   Lipase 28 11 - 51 U/L    Comment: Performed at [facility], [address], [place], [place] [address]  Brain natriuretic peptide     Status: Abnormal   Collection Time: 06/29/22  1:32 PM  Result Value Ref Range   B Natriuretic Peptide 438.6 (H) 0.0 - 100.0 pg/mL    Comment: Performed at [facility], [address] [place], [place] [address]  Lactic acid, plasma     Status: None   Collection Time: 06/29/22  1:34 PM  Result Value Ref Range   Lactic Acid, Venous 1.0 0.5 - 1.9 mmol/L    Comment: Performed at [facility], [address], [place], [place] [address]   DG Chest Port 1 View  Result Date: 06/29/2022 CLINICAL DATA:  Questionable sepsis. EXAM: PORTABLE CHEST 1 VIEW COMPARISON:  Chest x-ray 05/19/2022 and older FINDINGS: Stable changes of median sternotomy. Tracheostomy tube. Once again the tracheostomy balloon appears to be larger than usually seen. Normal cardiopericardial silhouette with tortuous and ectatic aorta. Diffuse interstitial changes are again noted to the lungs with a tiny right effusion. No pneumothorax. Increasing interstitial changes. Overlapping cardiac leads. IMPRESSION: Increasing interstitial changes of the lungs. Acute process is possible. Tiny right effusion. Recommend short follow-up Postop chest. Tracheostomy tube in place. Once again the tracheostomy balloon appears larger than usually seen. Please correlate for level of inflation. Electronically Signed   By: Karen Kays M.D.   On: 06/29/2022 13:22    Pending Labs Unresulted Labs (From admission, onward)     Start     Ordered   06/29/22 1559  CBC  (heparin)  Once,   R       Comments: Baseline for heparin therapy IF NOT ALREADY DRAWN.  Notify MD if PLT < 100 K.  06/29/22 1558   06/29/22 1559  Creatinine, serum  (heparin)  Once,   R       Comments: Baseline for heparin therapy IF NOT ALREADY DRAWN.    06/29/22 1558   06/29/22 1559  Procalcitonin  Once,   R       References:    Procalcitonin Lower Respiratory Tract Infection AND Sepsis Procalcitonin Algorithm    06/29/22 1558   06/29/22 1559  Protime-INR  Once,   R        06/29/22 1558   06/29/22 1559  APTT  Once,   R        06/29/22 1558   06/29/22 1257  Lactic acid, plasma  (Septic presentation on arrival (screening labs, nursing and treatment orders for obvious sepsis))  Now then every 2 hours,   R (with STAT occurrences)      06/29/22 1258   06/29/22 1257  Blood Culture (routine x 2)  (Septic presentation on arrival (screening labs, nursing and treatment orders for obvious sepsis))  BLOOD CULTURE X 2,   STAT      06/29/22 1258   06/29/22 1257  Urinalysis, w/ Reflex to Culture (Infection Suspected) -Urine, Catheterized  (Septic presentation on arrival (screening labs, nursing and treatment orders for obvious sepsis))  Once,   URGENT       Question:  Specimen Source  Answer:  Urine, Catheterized   06/29/22 1258            Vitals/Pain Today's Vitals   06/29/22 1317 06/29/22 1400 06/29/22 1430 06/29/22 1515  BP:  113/76 (!) 121/93 104/72  Pulse:  (!) 117 (!) 117 (!) 105  Resp:  16 20 20   Temp:      TempSrc:      SpO2:  100% 97% 100%  Weight: 55.8 kg     Height: 5\' 10"  (1.778 m)     PainSc: 0-No pain       Isolation Precautions No active isolations  Medications Medications  lactated ringers infusion ( Intravenous New Bag/Given 06/29/22 1533)  vancomycin (VANCOREADY) IVPB 1250 mg/250 mL (1,250 mg Intravenous New Bag/Given 06/29/22 1506)  vancomycin (VANCOREADY) IVPB 750 mg/150 mL (has no administration in time range)  meropenem (MERREM) 1 g in sodium chloride 0.9 % 100 mL IVPB (has no administration in time range)  linezolid (ZYVOX) IVPB 600 mg (has no administration in time range)  docusate sodium (COLACE) capsule 100 mg (has no administration in time range)  polyethylene glycol (MIRALAX / GLYCOLAX) packet 17 g (has no administration in time range)  famotidine (PEPCID) tablet 20 mg (has no administration in time range)  heparin injection 5,000 Units (has no administration in time  range)  sodium chloride 0.9 % bolus 1,000 mL (0 mLs Intravenous Stopped 06/29/22 1533)  meropenem (MERREM) 1 g in sodium chloride 0.9 % 100 mL IVPB (0 g Intravenous Stopped 06/29/22 1508)    Mobility non-ambulatory     Focused Assessments   R Recommendations: See Admitting Provider Note  Report given to:   Additional Notes:Pt will shake head yes. If it is a no he won't shake his head.

## 2022-06-29 NOTE — Progress Notes (Signed)
Vasopressor PIV consult: Arrived to room: RN reports access is adequate for now, pt being transferred to ICU.

## 2022-06-30 ENCOUNTER — Inpatient Hospital Stay (HOSPITAL_COMMUNITY): Payer: Medicare Other

## 2022-06-30 ENCOUNTER — Encounter (HOSPITAL_COMMUNITY): Payer: Medicare Other

## 2022-06-30 DIAGNOSIS — R6521 Severe sepsis with septic shock: Secondary | ICD-10-CM | POA: Diagnosis not present

## 2022-06-30 DIAGNOSIS — I469 Cardiac arrest, cause unspecified: Secondary | ICD-10-CM | POA: Diagnosis not present

## 2022-06-30 DIAGNOSIS — J9621 Acute and chronic respiratory failure with hypoxia: Secondary | ICD-10-CM

## 2022-06-30 DIAGNOSIS — J9622 Acute and chronic respiratory failure with hypercapnia: Secondary | ICD-10-CM

## 2022-06-30 DIAGNOSIS — A419 Sepsis, unspecified organism: Secondary | ICD-10-CM | POA: Diagnosis not present

## 2022-06-30 HISTORY — PX: IR REPLACE G-TUBE SIMPLE WO FLUORO: IMG2323

## 2022-06-30 LAB — GLUCOSE, CAPILLARY
Glucose-Capillary: 151 mg/dL — ABNORMAL HIGH (ref 70–99)
Glucose-Capillary: 92 mg/dL (ref 70–99)
Glucose-Capillary: 92 mg/dL (ref 70–99)
Glucose-Capillary: 121 mg/dL — ABNORMAL HIGH (ref 70–99)
Glucose-Capillary: 73 mg/dL (ref 70–99)
Glucose-Capillary: 76 mg/dL (ref 70–99)

## 2022-06-30 LAB — CBC
HCT: 26.1 % — ABNORMAL LOW (ref 39.0–52.0)
Hemoglobin: 7.1 g/dL — ABNORMAL LOW (ref 13.0–17.0)
MCH: 26.6 pg (ref 26.0–34.0)
MCHC: 27.2 g/dL — ABNORMAL LOW (ref 30.0–36.0)
MCV: 97.8 fL (ref 80.0–100.0)
Platelets: 364 10*3/uL (ref 150–400)
RBC: 2.67 MIL/uL — ABNORMAL LOW (ref 4.22–5.81)
RDW: 17.8 % — ABNORMAL HIGH (ref 11.5–15.5)
WBC: 13.8 10*3/uL — ABNORMAL HIGH (ref 4.0–10.5)
nRBC: 0 % (ref 0.0–0.2)

## 2022-06-30 LAB — URINE CULTURE

## 2022-06-30 LAB — CULTURE, RESPIRATORY W GRAM STAIN

## 2022-06-30 LAB — HEPARIN LEVEL (UNFRACTIONATED)
Heparin Unfractionated: <0.1 IU/mL — ABNORMAL LOW (ref 0.30–0.70)
Heparin Unfractionated: 0.12 IU/mL — ABNORMAL LOW (ref 0.30–0.70)
Heparin Unfractionated: 0.1 IU/mL — ABNORMAL LOW (ref 0.30–0.70)

## 2022-06-30 LAB — TRIGLYCERIDES: Triglycerides: 78 mg/dL (ref <150)

## 2022-06-30 MED ORDER — MIDODRINE HCL 5 MG PO TABS
10.0000 mg | ORAL_TABLET | Freq: Three times a day (TID) | ORAL | Status: DC
  Administered 2022-06-30 – 2022-07-04 (×11): 10 mg
  Filled 2022-06-30 (×11): qty 2

## 2022-06-30 MED ORDER — ORAL CARE MOUTH RINSE: 15.0000 mL | OROMUCOSAL | Status: DC | PRN

## 2022-06-30 MED ORDER — NOREPINEPHRINE 4 MG/250ML-% IV SOLN
0.0000 ug/min | INTRAVENOUS | Status: DC
  Filled 2022-06-30: qty 250

## 2022-06-30 MED ORDER — ACETAZOLAMIDE SODIUM 500 MG IJ SOLR
500.0000 mg | Freq: Four times a day (QID) | INTRAMUSCULAR | Status: AC
  Administered 2022-06-30 – 2022-07-01 (×4): 500 mg via INTRAVENOUS
  Filled 2022-06-30 (×4): qty 500

## 2022-06-30 MED ORDER — CHLORHEXIDINE GLUCONATE CLOTH 2 % EX PADS
6.0000 | MEDICATED_PAD | Freq: Every day | CUTANEOUS | Status: DC
  Administered 2022-06-30 – 2022-07-04 (×5): 6 via TOPICAL

## 2022-06-30 MED ORDER — DIATRIZOATE MEGLUMINE & SODIUM 66-10 % PO SOLN
30.0000 mL | Freq: Once | ORAL | Status: AC
  Administered 2022-06-30: 30 mL
  Filled 2022-06-30: qty 30

## 2022-06-30 MED ORDER — STERILE WATER FOR INJECTION IJ SOLN
INTRAMUSCULAR | Status: AC
  Administered 2022-06-30: 5 mL
  Filled 2022-06-30: qty 10

## 2022-06-30 MED ORDER — ORAL CARE MOUTH RINSE
15.0000 mL | OROMUCOSAL | Status: DC
  Administered 2022-06-30 – 2022-07-04 (×44): 15 mL via OROMUCOSAL

## 2022-06-30 MED ORDER — DIATRIZOATE MEGLUMINE & SODIUM 66-10 % PO SOLN
ORAL | Status: AC
  Filled 2022-06-30: qty 30

## 2022-06-30 MED ORDER — FREE WATER
200.0000 mL | Freq: Four times a day (QID) | Status: DC
  Administered 2022-06-30 – 2022-07-04 (×16): 200 mL

## 2022-06-30 NOTE — Progress Notes (Signed)
  Admitted from [facility] with hypotension, concerns of recurrent sepsis, on trach/vent. TOC will follow for possible need for LTAC level of care at discharge.

## 2022-06-30 NOTE — Procedures (Signed)
PROCEDURE SUMMARY:  Successful exchange of 24 Fr balloon retention gastrostomy tube. No complications.   EBL = none.   Xray ordered.   Please see full dictation in imaging section of Epic for procedure details.   Lynann Bologna Arnav Cregg PA-C 06/30/2022 3:47 PM

## 2022-06-30 NOTE — Progress Notes (Signed)
RT note. Lukens trap attached to ballard to collect sputum sample at this time by RN.

## 2022-06-30 NOTE — Progress Notes (Signed)
Initial Nutrition Assessment  DOCUMENTATION CODES:   Underweight, Severe malnutrition in context of chronic illness  INTERVENTION:  When PEG able to be used, recommend the following: Tube feeding via cortrak: Osmolite 1.5 at 60 ml/h (1440 ml per day) Start at 30 and advance by 10mL q8h to goal of free water q4h Provides 2160 kcal, 90 gm protein, 1097 ml free water daily ( of free water TF+flush) When TF at goal, 1 packet Juven BID, each packet provides 95 calories, 2.5 grams of protein (collagen), and 9.8 grams of carbohydrate (3 grams sugar); also contains 7 grams of L-arginine and L-glutamine, 300 mg vitamin C, 15 mg vitamin E, 1.2 mcg vitamin B-12, 9.5 mg zinc, 200 mg calcium, and 1.5 g  Calcium Beta-hydroxy-Beta-methylbutyrate to support wound healing  NUTRITION DIAGNOSIS:   Severe Malnutrition related to chronic illness as evidenced by severe fat depletion, severe muscle depletion.  GOAL:   Patient will meet greater than or equal to 90% of their needs  MONITOR:   TF tolerance, Labs, Weight trends, I & O's  REASON FOR ASSESSMENT:  Ventilator    ASSESSMENT:   Pt with hx of CHF, CAD, parkinson's, and chronic respiratory failure with PEG/tracheostomy presented to ED from [facility] with hypotension. Brief PEA arrest in ED  6/9 - PEA arrest in ED  Pt resting in bed at the time of assessment. Patient is currently intubated on ventilator support via trach. No family present at this time to provide a history. PEG is in place and was assessed at bedside by IR. Bumper adjusted by RN states that it had slipped out of place again. TF to be held until tube is able to be evaluated by IR physician.   Pt has been followed by RD team in the past and has a history of malnutrition. When able, will initiate Osmolite 1.5 @ 60.   Temp (24hrs), Avg:98.3 F (36.8 C), Min:97.5 F (36.4 C), Max:98.8 F (37.1 C)   Intake/Output Summary (Last 24 hours) at 06/30/2022  1335 Last data filed at 06/30/2022 1200 Gross per 24 hour  Intake 3282.32 ml  Output 250 ml  Net 3032.32 ml  Net IO Since Admission: 3,032.32 mL [06/30/22 1335]  Nutritionally Relevant Medications: Scheduled Meds:  docusate  100 mg Per Tube BID   famotidine  20 mg Per Tube BID   free water  200 mL Per Tube Q6H   insulin aspart  0-9 Units Subcutaneous Q4H   polyethylene glycol  17 g Per Tube Daily   Continuous Infusions:  albumin human 25 g (06/30/22 1205)   linezolid (ZYVOX) IV Stopped (06/30/22 0302)   meropenem (MERREM) IV Stopped (06/30/22 0759)   norepinephrine (LEVOPHED) Adult infusion 2 mcg/min (06/30/22 1200)   propofol (DIPRIVAN) infusion Stopped (06/30/22 0740)   Labs Reviewed: Na 154 BUN 37, creatinine 0.42 CBG ranges from 92-151 mg/dL over the last 24 hours  NUTRITION - FOCUSED PHYSICAL EXAM: Flowsheet Row Most Recent Value  Orbital Region Severe depletion  Upper Arm Region Severe depletion  Thoracic and Lumbar Region Moderate depletion  Buccal Region Severe depletion  Temple Region Moderate depletion  Clavicle Bone Region Moderate depletion  Clavicle and Acromion Bone Region Severe depletion  Scapular Bone Region Severe depletion  Dorsal Hand Severe depletion  Patellar Region Severe depletion  Anterior Thigh Region Severe depletion  Posterior Calf Region Severe depletion  Edema (RD Assessment) None  Hair Reviewed  Eyes Reviewed  Mouth Reviewed  Skin Reviewed  Nails Reviewed    Diet  Order:   Diet Order             Diet NPO time specified  Diet effective now                   EDUCATION NEEDS:   Not appropriate for education at this time  Skin:  Skin Assessment: Reviewed RN Assessment Stage 1 to the right mid-coccyx, (8 cm x 7 cm)  Last BM:  6/10  Height:   Ht Readings from Last 1 Encounters:  06/29/22 5\' 10"  (1.778 m)    Weight:   Wt Readings from Last 1 Encounters:  06/29/22 55.8 kg    Ideal Body Weight:  75.5 kg  BMI:   Body mass index is 17.65 kg/m.  Estimated Nutritional Needs:  Kcal:  1700-2000 kcal/d Protein:  90-105 g/d Fluid:  1.8-2L/d    Greig Castilla, RD, LDN Clinical Dietitian RD pager # available in [facility]  After hours/weekend pager # available in [facility]

## 2022-06-30 NOTE — Progress Notes (Addendum)
ANTICOAGULATION CONSULT NOTE  Pharmacy Consult for heparin Indication: atrial fibrillation  Allergies  Allergen Reactions   Prednisone Other (See Comments)    Documented on MAR Unknown reaction   Cortisone Other (See Comments)    Documented on MAR Unknown reaction    Patient Measurements: Height: 5\' 10"  (177.8 cm) Weight: 55.8 kg (123 lb) IBW/kg (Calculated) : 73 Heparin Dosing Weight: 55.8 kg   Vital Signs: Temp: 97.8 F (36.6 C) (06/10 1116) Temp Source: Axillary (06/10 1116) BP: 130/79 (06/10 0900) Pulse Rate: 94 (06/10 1056)  Labs: Recent Labs    06/29/22 1332 06/29/22 1727 06/29/22 1732 06/29/22 1744 06/29/22 2103 06/29/22 2252 06/30/22 0119 06/30/22 0427 06/30/22 1000  HGB 8.7*  --   --  14.6 8.5*  --   --   --  7.1*  HCT 31.2*  --   --  43.0 25.0*  --   --   --  26.1*  PLT 459*  --   --   --   --   --   --   --  364  APTT  --  21*  --   --   --   --   --   --   --   LABPROT  --  16.8*  --   --   --   --   --   --   --   INR  --  1.3*  --   --   --   --   --   --   --   HEPARINUNFRC  --   --   --   --   --   --  <0.10* 0.12* 0.10*  CREATININE 0.42*  --   --   --   --   --   --   --   --   TROPONINIHS  --   --  46*  --   --  54*  --   --   --      Estimated Creatinine Clearance: 57.2 mL/min (A) (by C-G formula based on SCr of 0.42 mg/dL (L)).   Medical History: Past Medical History:  Diagnosis Date   CHF (congestive heart failure) (HCC)    Coronary artery disease    Essential tremor    GI bleed 2019   hospitalized at [facility] for one week   Headache    since childhood   Low blood sugar    since childhood, controlled by diet   Mitral regurgitation    Mitral valve prolapse    Osteopenia 2021   Paroxysmal atrial fibrillation (HCC)    Seizure-like activity (HCC)    Right gaze preference at baseline, essential termor at baseline, intermittent reduced responsiveness secondary to toxic/metabolic processes and poor cognitive reserve   Tremor      Medications:  Scheduled:   acetaZOLAMIDE  500 mg Intravenous Q6H   Chlorhexidine Gluconate Cloth  6 each Topical Daily   docusate  100 mg Per Tube BID   famotidine  20 mg Per Tube BID   free water  200 mL Per Tube Q6H   insulin aspart  0-9 Units Subcutaneous Q4H   midodrine  10 mg Per Tube Q8H   polyethylene glycol  17 g Per Tube Daily    Assessment: 81 yom presents from [facility] with tachycardia and hypotension complicated with brief arrest in ED - no AC PTA. Pharmacy consulted to dose heparin for afib. Will not bolus given history of GI bleds  Heparin  level 0.1 units/mL (subtherapeutic) on heparin 1000 units/hr. CBC resulted with Hgb 7.1 - will stop heparin and monitor without.  Goal of Therapy:  Heparin level 0.3-0.7 units/ml Monitor platelets by anticoagulation protocol: Yes   Plan:  Stop heparin infusion. SCDs ordered for VTE ppx. Re-consult pharmacy if wanting to restart infusion  Eldridge Scot, PharmD Clinical Pharmacist  06/30/2022 11:24 AM

## 2022-06-30 NOTE — Progress Notes (Signed)
   Asked to evaluate G tube and site G tube placed in IR 09/2020. Replaced in IR 03/15/22; bedside 24 Fr G tube Came into ED with "dislodged" G tube 06/12/22- but was in place per MD and imaging. ED MD did Rx Nystatin for surrounding skin site at that time  I have seen pt and note that G tube is intact Was loose from site/ bumper approx 1-2 cm from skin I cinched down the bumper to skin ; replaced one 4/4 slit guaze beneath bumper on skin. Skin is reddened; no sign of pus or blatant infection Tube size is smaller than opening  MD requesting IR for eval and replacement Will discuss with Radiologist---may possibly call pt down to IR for evaluation   Dtr at bedside--- aware.

## 2022-06-30 NOTE — Progress Notes (Signed)
Patient's PEG tube was replaced and x-ray confirmed. This RN gave patient his 1800 free water dose of 200 ml and it is noted that water continues to leak out onto his split gauze from the site.

## 2022-06-30 NOTE — Progress Notes (Signed)
ANTICOAGULATION CONSULT NOTE - Follow Up Consult  Pharmacy Consult for heparin Indication: atrial fibrillation  Labs: Recent Labs    06/29/22 1332 06/29/22 1727 06/29/22 1732 06/29/22 1744 06/29/22 2103 06/29/22 2252 06/30/22 0119  HGB 8.7*  --   --  14.6 8.5*  --   --   HCT 31.2*  --   --  43.0 25.0*  --   --   PLT 459*  --   --   --   --   --   --   APTT  --  21*  --   --   --   --   --   LABPROT  --  16.8*  --   --   --   --   --   INR  --  1.3*  --   --   --   --   --   HEPARINUNFRC  --   --   --   --   --   --  <0.10*  CREATININE 0.42*  --   --   --   --   --   --   TROPONINIHS  --   --  46*  --   --  54*  --     Assessment: 81yo male subtherapeutic on heparin with initial dosing for Afib; no infusion issues or signs of bleeding per RN.  Goal of Therapy:  Heparin level 0.3-0.7 units/ml   Plan:  Increase heparin infusion by 3-4 units/kg/hr to 1000 units/hr. Check level in 8 hours.   Vernard Gambles, PharmD, BCPS 06/30/2022 2:27 AM

## 2022-06-30 NOTE — Progress Notes (Addendum)
NAME:  Austin Davis, MRN:  [id number], DOB:  December 20, 1940, LOS: 1 ADMISSION DATE:  06/29/2022, CONSULTATION DATE:  06/29/22  REFERRING MD:  MD Elpidio Anis CHIEF COMPLAINT:  Hypotension   History of Present Illness:  Pt with a significant past medical history of CHF, CAD, Paroxysmal afib, Mitral regurg, Mitral valve prolapse s/p repair, seizure disorder, and Parkinson's disease, trach and peg, and recurrent sepsis with multidrug-resistant organisms (ESBL, Pseudomonas, Klebsiella, and Enterococcus), hx of GI bleeds, hypothyroidism and resides at [facility] presented with complaints of hypotension. Per report from [facility], patient was known to have a HR of 160 on the night of 6/8 and received 5mg  of metoprolol IV x2, which subsequently decreased the patient's SBP to the 70s. Patient received 1L of NS and 500 mls of LR in route from [facility] to [facility] ED. PCCM was consulted for further care and management.   Pertinent  Medical History   Past Medical History:  Diagnosis Date   CHF (congestive heart failure) (HCC)    Coronary artery disease    Essential tremor    GI bleed 2019   hospitalized at [facility] for one week   Headache    since childhood   Low blood sugar    since childhood, controlled by diet   Mitral regurgitation    Mitral valve prolapse    Osteopenia 2021   Paroxysmal atrial fibrillation (HCC)    Seizure-like activity (HCC)    Right gaze preference at baseline, essential termor at baseline, intermittent reduced responsiveness secondary to toxic/metabolic processes and poor cognitive reserve   Tremor      Significant Hospital Events: Including procedures, antibiotic start and stop dates in addition to other pertinent events   6/9 Admitted from [facility] with hypotension, concerns of recurrent sepsis, on trach/vent, went into PEA cardiac arrest, ROSC achieved after 2 minutes  Interim History / Subjective:  Patient went into PEA cardiac arrest in the emergency department yesterday, ROSC  was achieved after 2 minutes Remain afebrile Going in and out of A-fib, currently on amiodarone infusion Requiring Levophed As thick copious amount of respiratory secretions  Objective   Blood pressure 116/78, pulse 81, temperature 98.7 F (37.1 C), temperature source Axillary, resp. rate (!) 31, height 5\' 10"  (1.778 m), weight 55.8 kg, SpO2 100 %.    Vent Mode: PCV FiO2 (%):  [40 %-100 %] 40 % Set Rate:  [18 bmp-35 bmp] 35 bmp PEEP:  [8 cmH20] 8 cmH20 Plateau Pressure:  [26 cmH20-37 cmH20] 26 cmH20   Intake/Output Summary (Last 24 hours) at 06/30/2022 4098 Last data filed at 06/30/2022 0600 Gross per 24 hour  Intake 2387.28 ml  Output 250 ml  Net 2137.28 ml   Filed Weights   06/29/22 1317  Weight: 55.8 kg    Examination:   Physical exam: General: Acute on chronically ill-appearing male, s/p trach HEENT: [place]/AT, eyes anicteric.  Moist mucous membranes Neuro: Eyes open, tracking examiner, continue to have coarse tremors Chest: Coarse breath sounds, no wheezes or rhonchi Heart: Irregular irregular, tachycardic, no murmurs or gallops Abdomen: Soft, nondistended, sluggish bowel sounds present.  G-tube in place with some leaking around Skin: Stage I decubitus ulcer present on coccyx, POA  Labs and images were reviewed  Resolved Hospital Problem list   N/a   Assessment & Plan:  Sepsis with septic shock Hx multidrug-resistant organisms (ESBL, Pseudomonas, Klebsiella, and Enterococcus) Suspecting possible recurrent UTI/pneumonia UA is consistent with UTI He has thick copious amount of secretions White count are elevated with  left shift Follow-up urine and respiratory culture Continue to require vasopressor support to maintain MAP goal 65 DC IV fluid Continuous Cardiac Monitoring and VS monitoring per ICU protocol  Monitor Strict I/O  Continue Linezolid and Meropenem   Status post PEA cardiac arrest Patient into PEA cardiac arrest likely due to septic shock ROSC was  achieved after 2 minutes of CPR and 1 mg of IV epinephrine Continue telemetry monitoring  Acute septic encephalopathy Seizure Disorder Parkinson's Disease  Continue Keppra and valproic acid Neuro-protective measures Seizure precautions  Delirium precautions  Avoid sedation Currently on fentanyl 100 mics  Acute 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 Head of bed elevated 30 degrees. Plateau pressures less than 30 cm H20.  Follow intermittent chest x-ray and ABG.   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 CAD Paroxysmal afib with RVR Monitor intake and output Going in and out of A-fib Continue amiodarone Monitor on Cardiac Tele continuously  Started on IV heparin Repeat echocardiogram is pending  Hypothyroidism  Continue synthroid  Severe Malnutrition on Chronic Tube feeds PEG tube leaking around site IR consult placed, hold tube feeds until IR evals   Pressure Injury: Stage 1 coccyx, POA Continue off loading sacrum Utilize foam dressing  Supportive wound care   Hypernatremia/contraction alkalosis Hold diuretics Started on free water flushes Started on acetazolamide  Lactic acidosis resolved  Best Practice (right click and "Reselect all SmartList Selections" daily)   Diet/type: NPO, restart tube feeds 6/10 once cleared by IR DVT prophylaxis: Systemic heparin GI prophylaxis: PPI Lines: N/A Foley:  N/A Code Status:  full code Last date of multidisciplinary goals of care discussion [6/10: Patient's daughter was updated at bedside, decision was to continue full scope of care  Labs   CBC: Recent Labs  Lab 06/29/22 1332 06/29/22 1744 06/29/22 2103  WBC 24.9*  --   --   NEUTROABS 23.7*  --   --   HGB 8.7* 14.6 8.5*  HCT  31.2* 43.0 25.0*  MCV 92.6  --   --   PLT 459*  --   --     Basic Metabolic Panel: Recent Labs  Lab 06/29/22 1332 06/29/22 1744 06/29/22 2103  NA 153* 151* 154*  K 4.1 3.9 3.6  CL 101  --   --   CO2 41*  --   --   GLUCOSE 129*  --   --   BUN 37*  --   --   CREATININE 0.42*  --   --   CALCIUM 8.7*  --   --    GFR: Estimated Creatinine Clearance: 57.2 mL/min (A) (by C-G formula based on SCr of 0.42 mg/dL (L)). Recent Labs  Lab 06/29/22 1332 06/29/22 1334 06/29/22 1512 06/29/22 1727 06/29/22 1732 06/29/22 2252  PROCALCITON  --   --   --  0.53  --   --   WBC 24.9*  --   --   --   --   --   LATICACIDVEN  --  1.0 0.8  --  3.1* 0.6    Liver Function Tests: Recent Labs  Lab 06/29/22 1332  AST 24  ALT 37  ALKPHOS 166*  BILITOT 0.6  PROT 7.4  ALBUMIN 1.7*   Recent Labs  Lab 06/29/22 1332  LIPASE 28   No results for  input(s): "AMMONIA" in the last 168 hours.  ABG    Component Value Date/Time   PHART 7.320 (L) 06/29/2022 2103   PCO2ART 91.7 (HH) 06/29/2022 2103   PO2ART 153 (H) 06/29/2022 2103   HCO3 47.3 (H) 06/29/2022 2103   TCO2 >50 (H) 06/29/2022 2103   ACIDBASEDEF 2.0 05/30/2021 0052   O2SAT 99 06/29/2022 2103     Coagulation Profile: Recent Labs  Lab 06/29/22 1727  INR 1.3*    Cardiac Enzymes: No results for input(s): "CKTOTAL", "CKMB", "CKMBINDEX", "TROPONINI" in the last 168 hours.  HbA1C: Hgb A1c MFr Bld  Date/Time Value Ref Range Status  01/04/2022 11:22 PM 5.3 4.8 - 5.6 % Final    Comment:    (NOTE)         Prediabetes: 5.7 - 6.4         Diabetes: >6.4         Glycemic control for adults with diabetes: <7.0   04/11/2021 09:12 PM 5.5 4.8 - 5.6 % Final    Comment:    (NOTE) Pre diabetes:          5.7%-6.4%  Diabetes:              >6.4%  Glycemic control for   <7.0% adults with diabetes     CBG: Recent Labs  Lab 06/29/22 1719 06/29/22 1947 06/29/22 2314 06/30/22 0330 06/30/22 0747  GLUCAP 138* 133* 135* 151* 92    The patient is critically ill due to septic shock, acute on chronic hypoxic/hypercapnic respiratory failure.  Critical care was necessary to treat or prevent imminent or life-threatening deterioration.  Critical care was time spent personally by me on the following activities: development of treatment plan with patient and/or surrogate as well as nursing, discussions with consultants, evaluation of patient's response to treatment, examination of patient, obtaining history from patient or surrogate, ordering and performing treatments and interventions, ordering and review of laboratory studies, ordering and review of radiographic studies, pulse oximetry, re-evaluation of patient's condition and participation in multidisciplinary rounds.   During this encounter critical care time was devoted to patient care services described in this note for 42 minutes.     Cheri Fowler, MD [facility] See Amion for pager If no response to pager, please call [phone] until 7pm After 7pm, Please call E-link [phone]

## 2022-07-01 ENCOUNTER — Inpatient Hospital Stay (HOSPITAL_COMMUNITY): Payer: Medicare Other

## 2022-07-01 DIAGNOSIS — A419 Sepsis, unspecified organism: Secondary | ICD-10-CM | POA: Diagnosis not present

## 2022-07-01 DIAGNOSIS — R609 Edema, unspecified: Secondary | ICD-10-CM

## 2022-07-01 DIAGNOSIS — J9612 Chronic respiratory failure with hypercapnia: Secondary | ICD-10-CM

## 2022-07-01 DIAGNOSIS — J9601 Acute respiratory failure with hypoxia: Secondary | ICD-10-CM | POA: Diagnosis not present

## 2022-07-01 LAB — TYPE AND SCREEN: Unit division: 0

## 2022-07-01 LAB — CBC WITH DIFFERENTIAL/PLATELET
Abs Immature Granulocytes: 0.69 10*3/uL — ABNORMAL HIGH (ref 0.00–0.07)
Basophils Absolute: 0.1 10*3/uL (ref 0.0–0.1)
Basophils Relative: 0 %
Eosinophils Absolute: 0.1 10*3/uL (ref 0.0–0.5)
Eosinophils Relative: 1 %
HCT: 35.4 % — ABNORMAL LOW (ref 39.0–52.0)
Hemoglobin: 9.6 g/dL — ABNORMAL LOW (ref 13.0–17.0)
Immature Granulocytes: 4 %
Lymphocytes Relative: 4 %
Lymphs Abs: 0.7 10*3/uL (ref 0.7–4.0)
MCH: 26.7 pg (ref 26.0–34.0)
MCHC: 27.1 g/dL — ABNORMAL LOW (ref 30.0–36.0)
MCV: 98.6 fL (ref 80.0–100.0)
Monocytes Absolute: 0.9 10*3/uL (ref 0.1–1.0)
Monocytes Relative: 5 %
Neutro Abs: 15 10*3/uL — ABNORMAL HIGH (ref 1.7–7.7)
Neutrophils Relative %: 86 %
Platelets: 407 10*3/uL — ABNORMAL HIGH (ref 150–400)
RBC: 3.59 MIL/uL — ABNORMAL LOW (ref 4.22–5.81)
RDW: 17.2 % — ABNORMAL HIGH (ref 11.5–15.5)
WBC: 17.4 10*3/uL — ABNORMAL HIGH (ref 4.0–10.5)
nRBC: 0.3 % — ABNORMAL HIGH (ref 0.0–0.2)

## 2022-07-01 LAB — CBC
HCT: 23.5 % — ABNORMAL LOW (ref 39.0–52.0)
Hemoglobin: 6.5 g/dL — CL (ref 13.0–17.0)
MCH: 25.9 pg — ABNORMAL LOW (ref 26.0–34.0)
MCHC: 27.7 g/dL — ABNORMAL LOW (ref 30.0–36.0)
MCV: 93.6 fL (ref 80.0–100.0)
Platelets: 329 10*3/uL (ref 150–400)
RBC: 2.51 MIL/uL — ABNORMAL LOW (ref 4.22–5.81)
RDW: 18 % — ABNORMAL HIGH (ref 11.5–15.5)
WBC: 10.3 10*3/uL (ref 4.0–10.5)
nRBC: 0 % (ref 0.0–0.2)

## 2022-07-01 LAB — COMPREHENSIVE METABOLIC PANEL
ALT: 32 U/L (ref 0–44)
AST: 19 U/L (ref 15–41)
Albumin: 2.3 g/dL — ABNORMAL LOW (ref 3.5–5.0)
Alkaline Phosphatase: 87 U/L (ref 38–126)
Anion gap: 15 (ref 5–15)
BUN: 21 mg/dL (ref 8–23)
CO2: 32 mmol/L (ref 22–32)
Calcium: 8.4 mg/dL — ABNORMAL LOW (ref 8.9–10.3)
Chloride: 101 mmol/L (ref 98–111)
Creatinine, Ser: 0.45 mg/dL — ABNORMAL LOW (ref 0.61–1.24)
GFR, Estimated: >60 mL/min (ref >60)
Glucose, Bld: 220 mg/dL — ABNORMAL HIGH (ref 70–99)
Potassium: 2.3 mmol/L — CL (ref 3.5–5.1)
Sodium: 148 mmol/L — ABNORMAL HIGH (ref 135–145)
Total Bilirubin: 0.4 mg/dL (ref 0.3–1.2)
Total Protein: 5.9 g/dL — ABNORMAL LOW (ref 6.5–8.1)

## 2022-07-01 LAB — BPAM RBC: ISSUE DATE / TIME: 202406111052

## 2022-07-01 LAB — HEMOGLOBIN AND HEMATOCRIT, BLOOD
HCT: 33.8 % — ABNORMAL LOW (ref 39.0–52.0)
Hemoglobin: 9.5 g/dL — ABNORMAL LOW (ref 13.0–17.0)

## 2022-07-01 LAB — GLUCOSE, CAPILLARY
Glucose-Capillary: 69 mg/dL — ABNORMAL LOW (ref 70–99)
Glucose-Capillary: 146 mg/dL — ABNORMAL HIGH (ref 70–99)
Glucose-Capillary: 100 mg/dL — ABNORMAL HIGH (ref 70–99)
Glucose-Capillary: 97 mg/dL (ref 70–99)
Glucose-Capillary: 113 mg/dL — ABNORMAL HIGH (ref 70–99)
Glucose-Capillary: 116 mg/dL — ABNORMAL HIGH (ref 70–99)
Glucose-Capillary: 209 mg/dL — ABNORMAL HIGH (ref 70–99)

## 2022-07-01 LAB — PHOSPHORUS
Phosphorus: 2.7 mg/dL (ref 2.5–4.6)
Phosphorus: 4.4 mg/dL (ref 2.5–4.6)

## 2022-07-01 LAB — TROPONIN I (HIGH SENSITIVITY): Troponin I (High Sensitivity): 17 ng/L (ref <18)

## 2022-07-01 LAB — MAGNESIUM
Magnesium: 2 mg/dL (ref 1.7–2.4)
Magnesium: 2 mg/dL (ref 1.7–2.4)

## 2022-07-01 LAB — CULTURE, BLOOD (ROUTINE X 2)

## 2022-07-01 LAB — PREPARE RBC (CROSSMATCH)

## 2022-07-01 MED ORDER — SODIUM CHLORIDE 0.9% IV SOLUTION: Freq: Once | INTRAVENOUS | Status: DC

## 2022-07-01 MED ORDER — SERTRALINE HCL 50 MG PO TABS
100.0000 mg | ORAL_TABLET | Freq: Every day | ORAL | Status: DC
  Administered 2022-07-01 – 2022-07-04 (×4): 100 mg
  Filled 2022-07-01 (×4): qty 2

## 2022-07-01 MED ORDER — FENTANYL CITRATE PF 50 MCG/ML IJ SOSY: 25.0000 ug | PREFILLED_SYRINGE | INTRAMUSCULAR | Status: DC | PRN

## 2022-07-01 MED ORDER — PANTOPRAZOLE SODIUM 40 MG IV SOLR
40.0000 mg | Freq: Two times a day (BID) | INTRAVENOUS | Status: DC
  Administered 2022-07-01 – 2022-07-04 (×7): 40 mg via INTRAVENOUS
  Filled 2022-07-01 (×7): qty 10

## 2022-07-01 MED ORDER — HYDROXYZINE HCL 25 MG PO TABS
25.0000 mg | ORAL_TABLET | Freq: Four times a day (QID) | ORAL | Status: DC
  Administered 2022-07-01 (×2): 25 mg
  Filled 2022-07-01 (×2): qty 1

## 2022-07-01 MED ORDER — FENTANYL CITRATE PF 50 MCG/ML IJ SOSY
12.5000 ug | PREFILLED_SYRINGE | INTRAMUSCULAR | Status: DC | PRN
  Administered 2022-07-04: 12.5 ug via INTRAVENOUS
  Filled 2022-07-01: qty 1

## 2022-07-01 MED ORDER — STERILE WATER FOR INJECTION IJ SOLN
INTRAMUSCULAR | Status: AC
  Administered 2022-07-01: 5 mL
  Filled 2022-07-01: qty 10

## 2022-07-01 MED ORDER — INSULIN ASPART 100 UNIT/ML IJ SOLN
0.0000 [IU] | INTRAMUSCULAR | Status: DC
  Administered 2022-07-01: 2 [IU] via SUBCUTANEOUS
  Administered 2022-07-02 (×2): 1 [IU] via SUBCUTANEOUS

## 2022-07-01 MED ORDER — FENTANYL CITRATE PF 50 MCG/ML IJ SOSY
PREFILLED_SYRINGE | INTRAMUSCULAR | Status: AC
  Administered 2022-07-01: 50 ug via INTRAVENOUS
  Filled 2022-07-01: qty 1

## 2022-07-01 MED ORDER — POTASSIUM CHLORIDE 20 MEQ PO PACK
60.0000 meq | PACK | Freq: Once | ORAL | Status: AC
  Administered 2022-07-01: 60 meq
  Filled 2022-07-01: qty 3

## 2022-07-01 MED ORDER — JUVEN PO PACK
1.0000 | PACK | Freq: Two times a day (BID) | ORAL | Status: DC
  Administered 2022-07-02 – 2022-07-04 (×6): 1
  Filled 2022-07-01 (×6): qty 1

## 2022-07-01 MED ORDER — AMIODARONE HCL 200 MG PO TABS
200.0000 mg | ORAL_TABLET | Freq: Every day | ORAL | Status: DC
  Administered 2022-07-01 – 2022-07-04 (×4): 200 mg
  Filled 2022-07-01 (×4): qty 1

## 2022-07-01 MED ORDER — LEVOTHYROXINE SODIUM 25 MCG PO TABS
25.0000 ug | ORAL_TABLET | Freq: Every day | ORAL | Status: DC
  Administered 2022-07-01 – 2022-07-04 (×4): 25 ug
  Filled 2022-07-01 (×4): qty 1

## 2022-07-01 MED ORDER — OSMOLITE 1.5 CAL PO LIQD
1000.0000 mL | ORAL | Status: DC
  Administered 2022-07-01 – 2022-07-04 (×3): 1000 mL
  Filled 2022-07-01 (×6): qty 1000

## 2022-07-01 MED ORDER — FUROSEMIDE 10 MG/ML IJ SOLN
40.0000 mg | Freq: Once | INTRAMUSCULAR | Status: AC
  Administered 2022-07-01: 40 mg via INTRAVENOUS
  Filled 2022-07-01: qty 4

## 2022-07-01 MED ORDER — POTASSIUM CHLORIDE 10 MEQ/100ML IV SOLN
10.0000 meq | INTRAVENOUS | Status: AC
  Administered 2022-07-01 (×6): 10 meq via INTRAVENOUS
  Filled 2022-07-01 (×6): qty 100

## 2022-07-01 MED ORDER — FOLIC ACID 1 MG PO TABS
1.0000 mg | ORAL_TABLET | Freq: Every day | ORAL | Status: DC
  Administered 2022-07-01 – 2022-07-04 (×4): 1 mg
  Filled 2022-07-01 (×4): qty 1

## 2022-07-01 MED ORDER — FENTANYL CITRATE PF 50 MCG/ML IJ SOSY: 50.0000 ug | PREFILLED_SYRINGE | Freq: Once | INTRAMUSCULAR | Status: AC

## 2022-07-01 MED ORDER — ACETAZOLAMIDE SODIUM 500 MG IJ SOLR
500.0000 mg | Freq: Two times a day (BID) | INTRAMUSCULAR | Status: AC
  Administered 2022-07-01 (×2): 500 mg via INTRAVENOUS
  Filled 2022-07-01 (×2): qty 500

## 2022-07-01 MED ORDER — FENTANYL CITRATE PF 50 MCG/ML IJ SOSY
25.0000 ug | PREFILLED_SYRINGE | INTRAMUSCULAR | Status: DC | PRN
  Administered 2022-07-01: 25 ug via INTRAVENOUS
  Filled 2022-07-01: qty 1

## 2022-07-01 MED ORDER — DEXTROSE 50 % IV SOLN
12.5000 g | INTRAVENOUS | Status: AC
  Administered 2022-07-01: 12.5 g via INTRAVENOUS
  Filled 2022-07-01: qty 50

## 2022-07-01 NOTE — Progress Notes (Signed)
eLink Physician-Brief Progress Note Patient Name: Austin Davis DOB: 12/11/1940 MRN: [id number]   Date of Service  07/01/2022  HPI/Events of Note  Patient received 25 mcg of Fentanyl iv and it was followed by somnolence, diaphoresis, smaller bilateral pupils, oxygen requirement has increased, recent K+ was 2.3. His blood pressure remained stable.  eICU Interventions  ABG, Troponin, CXR, CBC ordered, Fentanyl dosing reduced to 12.5 mcg iv PRN        Thomasene Lot Austin Davis 07/01/2022, 10:31 PM

## 2022-07-01 NOTE — TOC Initial Note (Signed)
Transition of Care [facility]) - Initial/Assessment Note    Patient Details  Name: Austin Davis MRN: [id number] Date of Birth: July 04, 1940  [facility]) CM/SW Contact:    Lorri Frederick, LCSW Phone Number: 07/01/2022, 1:47 PM  Clinical Narrative:         Pt legal guardian listed as Thayer Ohm [place] [employer].  CSW attempted to call him and left message.    CSW spoke with pt daughter [name] who provided information: pt has been at [facility] since fall 2022.  Pt disability money now goes directly to [facility], she has some frustrations with communication from [facility], but this does seem to be the long term option available to pt.  Discussed potential return to [facility] rather than SNF.      No guardianship paperwork noted to be in media tab in epic.   Expected Discharge Plan: Long Term Acute Care (LTAC) Barriers to Discharge: Continued Medical Work up   Patient Goals and CMS Choice            Expected Discharge Plan and Services In-house Referral: Clinical Social Work   Post Acute Care Choice:  (TBD) Living arrangements for the past 2 months: Skilled Nursing Facility ([facility])                                      Prior Living Arrangements/Services Living arrangements for the past 2 months: Skilled Nursing Facility ([facility]) Lives with:: Facility Resident              Current home services: Other (comment) (na)    Activities of Daily Living      Permission Sought/Granted                  Emotional Assessment              Admission diagnosis:  Hypernatremia [E87.0] Sepsis (HCC) [A41.9] Cellulitis of other specified site [L03.818] Pneumonia of both lungs due to infectious organism, unspecified part of lung [J18.9] Sepsis, due to unspecified organism, unspecified whether acute organ dysfunction present [facility]) [A41.9] Patient Active Problem List   Diagnosis Date Noted   Chronic respiratory failure with hypercapnia (HCC) 05/19/2022    PEG tube malfunction (HCC) 03/17/2022   Urinary tract infection without hematuria 03/17/2022   Acute hypoxic respiratory failure (HCC) 02/26/2022   Multiple drug resistant organism (MDRO) culture positive 02/04/2022   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   HCAP (healthcare-associated pneumonia) 02/01/2022   Acute respiratory failure with hypoxia (HCC) 01/05/2022   Hyperkalemia 01/04/2022   Encephalopathy acute 01/04/2022   Seizure (HCC) 01/04/2022   Acute on chronic respiratory failure (HCC) 10/26/2021   Seizures (HCC) 06/26/2021   Tremor    Sepsis (HCC) 06/25/2021   Cardiac arrest due to respiratory disorder (HCC) 06/07/2021   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   Impaired quality of life 05/30/2021   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 05/15/2021   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HCC) 05/08/2021   Pneumonia 05/08/2021   Urinary tract infection associated with indwelling urethral catheter (HCC) 05/08/2021   Encephalopathy 05/08/2021   Pressure injury of skin 04/30/2021   Seizure disorder (HCC) 04/29/2021   Pneumothorax 04/11/2021   Ventilator dependent (HCC)    Aspiration pneumonia (HCC) 03/25/2021   Parkinsonism 03/25/2021   Obstructive sleep apnea 10/24/2020   Chronic obstructive pulmonary disease (HCC) 10/24/2020  Chronic heart failure with preserved ejection fraction (HCC) 10/24/2020   Unintentional weight loss 09/20/2020   SVT (supraventricular tachycardia) 09/20/2020   Protein-calorie malnutrition, severe 09/17/2020   Acute on chronic respiratory failure with hypoxia and hypercapnia (HCC) 09/16/2020   Failure to thrive in adult 09/16/2020   Stage 1 skin ulcer of sacral region (HCC) 09/16/2020   Chronic hypotension 06/30/2020   History of GI bleed 02/17/2017   Rectal bleeding 02/16/2017   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 [address] [place] [address] Phone: [phone] Fax: [phone]     Social Determinants of Health (SDOH) Social History: SDOH Screenings   Alcohol Screen: Low Risk  (04/12/2021)  Tobacco Use: Low Risk  (06/30/2022)   SDOH Interventions:     Readmission Risk Interventions    03/18/2022   11:45 AM 03/03/2022   12:04 PM 02/07/2022   11:58 AM  Readmission Risk Prevention Plan  Transportation Screening Complete Complete Complete  Medication Review [employer]) Complete Complete   PCP or Specialist appointment within 3-5 days of discharge Complete Complete   HRI or Home Care Consult Complete Complete   SW Recovery Care/Counseling Consult Complete Complete Complete  Palliative Care Screening Not Applicable Not Applicable   Skilled Nursing Facility Complete Complete Complete

## 2022-07-01 NOTE — Progress Notes (Signed)
BLE venous duplex has been completed.   Results can be found under chart review under CV PROC. 07/01/2022 4:24 PM Jance Siek RVT, RDMS

## 2022-07-01 NOTE — Inpatient Diabetes Management (Signed)
Inpatient Diabetes Program Recommendations  AACE/ADA: New Consensus Statement on Inpatient Glycemic Control (2015)  Target Ranges:  Prepandial:   less than 140 mg/dL      Peak postprandial:   less than 180 mg/dL (1-2 hours)      Critically ill patients:  140 - 180 mg/dL   Lab Results  Component Value Date   GLUCAP 100 (H) 07/01/2022   HGBA1C 5.3 01/04/2022    Latest Reference Range & Units 06/30/22 03:30 06/30/22 07:47 06/30/22 11:14 06/30/22 16:03 06/30/22 19:20 06/30/22 23:20 07/01/22 03:08 07/01/22 04:01 07/01/22 07:42  Glucose-Capillary 70 - 99 mg/dL 161 (H) Novolog 2 units 92 92 121 (H) Novolog 1 unit 73 76 69 (L) 146 (H) 100 (H)  (H): Data is abnormally high (L): Data is abnormally low  Diabetes history: No known hx DM Outpatient Diabetes medications: None Current orders for Inpatient glycemic control: Novolog 0-9 units q 4 hrs.  Inpatient Diabetes Program Recommendations:   Hypoglycemia post Novolog correction. Please consider: -Decrease Novolog correction to 0-6 units q 4 hrs.  Thank you, Austin Davis. Atlanta Pelto, RN, MSN, CDE  Diabetes Coordinator Inpatient Glycemic Control Team Team Pager [phone] (8am-5pm) 07/01/2022 9:46 AM

## 2022-07-01 NOTE — Progress Notes (Signed)
NAME:  Austin Davis, MRN:  [id number], DOB:  [birth year], LOS: 2 ADMISSION DATE:  06/29/2022, CONSULTATION DATE:  06/29/22  REFERRING MD:  MD Elpidio Anis CHIEF COMPLAINT:  Hypotension   History of Present Illness:  Pt with a significant past medical history of CHF, CAD, Paroxysmal afib, Mitral regurg, Mitral valve prolapse s/p repair, seizure disorder, and Parkinson's disease, trach and peg, and recurrent sepsis with multidrug-resistant organisms (ESBL, Pseudomonas, Klebsiella, and Enterococcus), hx of GI bleeds, hypothyroidism and resides at [facility] presented with complaints of hypotension. Per report from [facility], patient was known to have a HR of 160 on the night of 6/8 and received 5mg  of metoprolol IV x2, which subsequently decreased the patient's SBP to the 70s. Patient received 1L of NS and 500 mls of LR in route from [facility] to [facility] ED. PCCM was consulted for further care and management.   Pertinent  Medical History   Past Medical History:  Diagnosis Date   CHF (congestive heart failure) (HCC)    Coronary artery disease    Essential tremor    GI bleed 2019   hospitalized at [facility] for one week   Headache    since childhood   Low blood sugar    since childhood, controlled by diet   Mitral regurgitation    Mitral valve prolapse    Osteopenia 2021   Paroxysmal atrial fibrillation (HCC)    Seizure-like activity (HCC)    Right gaze preference at baseline, essential termor at baseline, intermittent reduced responsiveness secondary to toxic/metabolic processes and poor cognitive reserve   Tremor      Significant Hospital Events: Including procedures, antibiotic start and stop dates in addition to other pertinent events   6/9 Admitted from [facility] with hypotension, concerns of recurrent sepsis, on trach/vent, went into PEA cardiac arrest, ROSC achieved after 2 minutes  Interim History / Subjective:  Patient remained in sinus rhythm Respiratory culture is growing  Pseudomonas Hemoglobin dropped down to 6.5, received 1 unit of PRBC  Objective   Blood pressure (!) 151/76, pulse 82, temperature 98.6 F (37 C), temperature source Oral, resp. rate (!) 32, height 5\' 10"  (1.778 m), weight 53.5 kg, SpO2 99 %.    Vent Mode: PCV FiO2 (%):  [40 %] 40 % Set Rate:  [32 bmp] 32 bmp PEEP:  [5 cmH20] 5 cmH20 Plateau Pressure:  [29 cmH20-31 cmH20] 29 cmH20   Intake/Output Summary (Last 24 hours) at 07/01/2022 1029 Last data filed at 07/01/2022 0800 Gross per 24 hour  Intake 1998.23 ml  Output 1050 ml  Net 948.23 ml   Filed Weights   06/29/22 1317 07/01/22 0500  Weight: 55.8 kg 53.5 kg    Examination: Physical exam: General: Acute on chronically ill-appearing elderly male, lying on the bed HEENT: [place]/AT, eyes anicteric.  moist mucus membranes.  S/p trach Neuro: Alert, following simple commands, continues to have coarse tremors Chest: Bilateral basal crackles no wheezes or rhonchi Heart: Regular rate and rhythm, no murmurs or gallops Abdomen: Soft, nontender, nondistended, bowel sounds present Skin: Stage I decubitus ulcer on coccyx, POA  Labs and images were reviewed  Resolved Hospital Problem list   N/a   Assessment & Plan:  Sepsis with septic shock Hx multidrug-resistant organisms (ESBL, Pseudomonas, Klebsiella, and Enterococcus) Due to recurrent UTI/pneumonia UA is consistent with UTI Respiratory culture is growing Pseudomonas He has thick copious amount of secretions White counts are trending down Follow-up sensitivity results on respiratory culture Vasopressors were titrated off Continuous Cardiac Monitoring and  VS monitoring per ICU protocol  Monitor Strict I/O  Stopped linezolid, continue meropenem  Status post PEA cardiac arrest Patient into PEA cardiac arrest likely due to septic shock ROSC was achieved after 2 minutes of CPR and 1 mg of IV epinephrine Continue telemetry monitoring Now awake, following commands  Acute septic  encephalopathy Seizure Disorder Parkinson's Disease  Continue Keppra and valproic acid Neuro-protective measures Seizure precautions  Delirium precautions  Avoid sedation off sedation  Acute 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 Plateau pressures less than 30 cm H20.  Follow intermittent chest x-ray and ABG.   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 CAD Paroxysmal afib with RVR Monitor intake and output Converted to sinus rhythm Discontinue amiodarone infusion, started on amiodarone 200 mg daily Monitor on Cardiac Tele continuously  Stop anticoagulation due to drop in hemoglobin Repeat echocardiogram results are pending  Hypothyroidism  Continue synthroid  Severe Malnutrition on Chronic Tube feeds PEG tube leaking around site PEG tube was replaced by IR Restarted back on tube feeds  Pressure Injury: Stage 1 coccyx, POA Continue off loading sacrum Utilize foam dressing  Supportive wound care   Hypernatremia/contraction alkalosis Hold diuretics Continue free water flushes Continue acetazolamide  Lactic acidosis resolved  Acute on chronic blood loss anemia Patient's hemoglobin dropped down to 6.5, he had black stools As history of GI bleeding that is why he was not on anticoagulation prior Heparin infusion was discontinued Monitor H&H, he received 1 unit of PRBCs  Best Practice (right click and "Reselect all SmartList Selections" daily)   Diet/type: NPO, tube feed this DVT prophylaxis: SCD GI prophylaxis: PPI Lines: N/A Foley:  N/A Code Status:  full code Last date of multidisciplinary goals of care discussion [6/10: Patient's daughter was updated at bedside, decision was to continue full scope of care  Labs   CBC: Recent Labs   Lab 06/29/22 1332 06/29/22 1744 06/29/22 2103 06/30/22 1000 07/01/22 0328  WBC 24.9*  --   --  13.8* 10.3  NEUTROABS 23.7*  --   --   --   --   HGB 8.7* 14.6 8.5* 7.1* 6.5*  HCT 31.2* 43.0 25.0* 26.1* 23.5*  MCV 92.6  --   --  97.8 93.6  PLT 459*  --   --  364 329    Basic Metabolic Panel: Recent Labs  Lab 06/29/22 1332 06/29/22 1744 06/29/22 2103 07/01/22 0328  NA 153* 151* 154* 148*  K 4.1 3.9 3.6 2.3*  CL 101  --   --  101  CO2 41*  --   --  32  GLUCOSE 129*  --   --  220*  BUN 37*  --   --  21  CREATININE 0.42*  --   --  0.45*  CALCIUM 8.7*  --   --  8.4*  MG  --   --   --  2.0  PHOS  --   --   --  2.7   GFR: Estimated Creatinine Clearance: 54.8 mL/min (A) (by C-G formula based on SCr of 0.45 mg/dL (L)). Recent Labs  Lab 06/29/22 1332 06/29/22 1334 06/29/22 1512 06/29/22 1727 06/29/22 1732 06/29/22 2252 06/30/22 1000 07/01/22 0328  PROCALCITON  --   --   --  0.53  --   --   --   --  WBC 24.9*  --   --   --   --   --  13.8* 10.3  LATICACIDVEN  --  1.0 0.8  --  3.1* 0.6  --   --     Liver Function Tests: Recent Labs  Lab 06/29/22 1332 07/01/22 0328  AST 24 19  ALT 37 32  ALKPHOS 166* 87  BILITOT 0.6 0.4  PROT 7.4 5.9*  ALBUMIN 1.7* 2.3*   Recent Labs  Lab 06/29/22 1332  LIPASE 28   No results for input(s): "AMMONIA" in the last 168 hours.  ABG    Component Value Date/Time   PHART 7.320 (L) 06/29/2022 2103   PCO2ART 91.7 (HH) 06/29/2022 2103   PO2ART 153 (H) 06/29/2022 2103   HCO3 47.3 (H) 06/29/2022 2103   TCO2 >50 (H) 06/29/2022 2103   ACIDBASEDEF 2.0 05/30/2021 0052   O2SAT 99 06/29/2022 2103     Coagulation Profile: Recent Labs  Lab 06/29/22 1727  INR 1.3*    Cardiac Enzymes: No results for input(s): "CKTOTAL", "CKMB", "CKMBINDEX", "TROPONINI" in the last 168 hours.  HbA1C: Hgb A1c MFr Bld  Date/Time Value Ref Range Status  01/04/2022 11:22 PM 5.3 4.8 - 5.6 % Final    Comment:    (NOTE)         Prediabetes: 5.7 -  6.4         Diabetes: >6.4         Glycemic control for adults with diabetes: <7.0   04/11/2021 09:12 PM 5.5 4.8 - 5.6 % Final    Comment:    (NOTE) Pre diabetes:          5.7%-6.4%  Diabetes:              >6.4%  Glycemic control for   <7.0% adults with diabetes     CBG: Recent Labs  Lab 06/30/22 1920 06/30/22 2320 07/01/22 0308 07/01/22 0401 07/01/22 0742  GLUCAP 73 76 69* 146* 100*   The patient is critically ill due to septic shock, acute on chronic hypoxic/hypercapnic respiratory failure.  Critical care was necessary to treat or prevent imminent or life-threatening deterioration.  Critical care was time spent personally by me on the following activities: development of treatment plan with patient and/or surrogate as well as nursing, discussions with consultants, evaluation of patient's response to treatment, examination of patient, obtaining history from patient or surrogate, ordering and performing treatments and interventions, ordering and review of laboratory studies, ordering and review of radiographic studies, pulse oximetry, re-evaluation of patient's condition and participation in multidisciplinary rounds.   During this encounter critical care time was devoted to patient care services described in this note for 36 minutes.     Cheri Fowler, MD [place] [facility] See Amion for pager If no response to pager, please call [phone] until 7pm After 7pm, Please call E-link [phone]

## 2022-07-01 NOTE — Progress Notes (Signed)
Brief Nutrition Support Note  PEG tube exchanged for 56F in IR yesterday. Discussed in rounds with MD, ok to initiate feeds. Requests feed be titrated to goal quickly.   INTERVENTION:  Recommend the following: Tube feeding via cortrak: Osmolite 1.5 at 60 ml/h (1440 ml per day) Start at 30 and advance by 10mL q4h to goal of free water q4h Provides 2160 kcal, 90 gm protein, 1097 ml free water daily ( of free water TF+flush) When TF at goal tomorrow, start 1 packet Juven BID, each packet provides 95 calories, 2.5 grams of protein (collagen), and 9.8 grams of carbohydrate (3 grams sugar); also contains 7 grams of L-arginine and L-glutamine, 300 mg vitamin C, 15 mg vitamin E, 1.2 mcg vitamin B-12, 9.5 mg zinc, 200 mg calcium, and 1.5 g  Calcium Beta-hydroxy-Beta-methylbutyrate to support wound healing   Greig Castilla, RD, LDN Clinical Dietitian RD pager # available in AMION  After hours/weekend pager # available in [facility]

## 2022-07-01 NOTE — Progress Notes (Signed)
Attempted to call Legal State Guardian, Chris Heins, x4 times to get blood consent however no answer.  Attempted the 2 following numbers provided in the chart:  [phone]  [facility] and made them aware of above events

## 2022-07-01 NOTE — Progress Notes (Signed)
eLink Physician-Brief Progress Note Patient Name: Austin Davis DOB: Mar 23, 1940 MRN: [id number]   Date of Service  07/01/2022  HPI/Events of Note  82 year old male with a history of chronic debility with recurrent sepsis due to urinary tract infections and chronic tracheostomy and ventilator dependence who went into PEA arrest in the emergency department.  G-tube changed by IR. A.m. hemoglobin 6.5.  Potassium 2.3  eICU Interventions  KCl enteral and IV.  1 unit PRBC     Intervention Category Intermediate Interventions: Bleeding - evaluation and treatment with blood products Minor Interventions: Electrolytes abnormality - evaluation and management  Austin Davis 07/01/2022, 4:08 AM

## 2022-07-01 NOTE — Progress Notes (Addendum)
eLink Physician-Brief Progress Note Patient Name: Austin Davis DOB: April 10, 1940 MRN: [id number]   Date of Service  07/01/2022  HPI/Events of Note  CXR, Troponin and CBC reviewed. ABG pending.  eICU Interventions  No intervention.        Migdalia Dk 07/01/2022, 11:53 PM

## 2022-07-02 DIAGNOSIS — J9622 Acute and chronic respiratory failure with hypercapnia: Secondary | ICD-10-CM | POA: Diagnosis not present

## 2022-07-02 DIAGNOSIS — J9621 Acute and chronic respiratory failure with hypoxia: Secondary | ICD-10-CM | POA: Diagnosis not present

## 2022-07-02 DIAGNOSIS — A419 Sepsis, unspecified organism: Secondary | ICD-10-CM | POA: Diagnosis not present

## 2022-07-02 LAB — GLUCOSE, CAPILLARY
Glucose-Capillary: 115 mg/dL — ABNORMAL HIGH (ref 70–99)
Glucose-Capillary: 128 mg/dL — ABNORMAL HIGH (ref 70–99)
Glucose-Capillary: 138 mg/dL — ABNORMAL HIGH (ref 70–99)
Glucose-Capillary: 145 mg/dL — ABNORMAL HIGH (ref 70–99)
Glucose-Capillary: 148 mg/dL — ABNORMAL HIGH (ref 70–99)
Glucose-Capillary: 166 mg/dL — ABNORMAL HIGH (ref 70–99)

## 2022-07-02 LAB — TYPE AND SCREEN
ABO/RH(D): O POS
Antibody Screen: NEGATIVE

## 2022-07-02 LAB — BLOOD GAS, ARTERIAL
O2 Saturation: 95.7 %
Patient temperature: 36.6
pH, Arterial: <6.95 — CL (ref 7.35–7.45)
pO2, Arterial: 110 mmHg — ABNORMAL HIGH (ref 83–108)

## 2022-07-02 LAB — POCT I-STAT 7, (LYTES, BLD GAS, ICA,H+H)
Acid-Base Excess: 5 mmol/L — ABNORMAL HIGH (ref 0.0–2.0)
Acid-Base Excess: 6 mmol/L — ABNORMAL HIGH (ref 0.0–2.0)
Acid-Base Excess: 6 mmol/L — ABNORMAL HIGH (ref 0.0–2.0)
Acid-Base Excess: 8 mmol/L — ABNORMAL HIGH (ref 0.0–2.0)
Bicarbonate: 36 mmol/L — ABNORMAL HIGH (ref 20.0–28.0)
Bicarbonate: 35.6 mmol/L — ABNORMAL HIGH (ref 20.0–28.0)
Bicarbonate: 37.3 mmol/L — ABNORMAL HIGH (ref 20.0–28.0)
Bicarbonate: 37.4 mmol/L — ABNORMAL HIGH (ref 20.0–28.0)
Calcium, Ion: 1.27 mmol/L (ref 1.15–1.40)
Calcium, Ion: 1.26 mmol/L (ref 1.15–1.40)
Calcium, Ion: 1.27 mmol/L (ref 1.15–1.40)
Calcium, Ion: 1.24 mmol/L (ref 1.15–1.40)
HCT: 30 % — ABNORMAL LOW (ref 39.0–52.0)
HCT: 30 % — ABNORMAL LOW (ref 39.0–52.0)
HCT: 30 % — ABNORMAL LOW (ref 39.0–52.0)
HCT: 27 % — ABNORMAL LOW (ref 39.0–52.0)
Hemoglobin: 10.2 g/dL — ABNORMAL LOW (ref 13.0–17.0)
Hemoglobin: 10.2 g/dL — ABNORMAL LOW (ref 13.0–17.0)
Hemoglobin: 10.2 g/dL — ABNORMAL LOW (ref 13.0–17.0)
Hemoglobin: 9.2 g/dL — ABNORMAL LOW (ref 13.0–17.0)
O2 Saturation: 100 %
O2 Saturation: 99 %
O2 Saturation: 98 %
O2 Saturation: 97 %
Patient temperature: 98.6
Patient temperature: 97.9
Patient temperature: 97.5
Patient temperature: 97.1
Potassium: 4.2 mmol/L (ref 3.5–5.1)
Potassium: 4.3 mmol/L (ref 3.5–5.1)
Potassium: 4 mmol/L (ref 3.5–5.1)
Potassium: 4.1 mmol/L (ref 3.5–5.1)
Sodium: 147 mmol/L — ABNORMAL HIGH (ref 135–145)
Sodium: 147 mmol/L — ABNORMAL HIGH (ref 135–145)
Sodium: 148 mmol/L — ABNORMAL HIGH (ref 135–145)
Sodium: 146 mmol/L — ABNORMAL HIGH (ref 135–145)
TCO2: 39 mmol/L — ABNORMAL HIGH (ref 22–32)
TCO2: 38 mmol/L — ABNORMAL HIGH (ref 22–32)
TCO2: 40 mmol/L — ABNORMAL HIGH (ref 22–32)
TCO2: 40 mmol/L — ABNORMAL HIGH (ref 22–32)
pCO2 arterial: 102.6 mmHg (ref 32–48)
pCO2 arterial: 83.7 mmHg (ref 32–48)
pCO2 arterial: 103.1 mmHg (ref 32–48)
pCO2 arterial: 83.2 mmHg (ref 32–48)
pH, Arterial: 7.154 — CL (ref 7.35–7.45)
pH, Arterial: 7.235 — ABNORMAL LOW (ref 7.35–7.45)
pH, Arterial: 7.162 — CL (ref 7.35–7.45)
pH, Arterial: 7.257 — ABNORMAL LOW (ref 7.35–7.45)
pO2, Arterial: 266 mmHg — ABNORMAL HIGH (ref 83–108)
pO2, Arterial: 167 mmHg — ABNORMAL HIGH (ref 83–108)
pO2, Arterial: 149 mmHg — ABNORMAL HIGH (ref 83–108)
pO2, Arterial: 109 mmHg — ABNORMAL HIGH (ref 83–108)

## 2022-07-02 LAB — BPAM RBC
Blood Product Expiration Date: 202407042359
Unit Type and Rh: 5100

## 2022-07-02 LAB — BASIC METABOLIC PANEL
Anion gap: 7 (ref 5–15)
BUN: 23 mg/dL (ref 8–23)
CO2: 32 mmol/L (ref 22–32)
Calcium: 8.6 mg/dL — ABNORMAL LOW (ref 8.9–10.3)
Chloride: 104 mmol/L (ref 98–111)
Creatinine, Ser: 0.59 mg/dL — ABNORMAL LOW (ref 0.61–1.24)
GFR, Estimated: >60 mL/min (ref >60)
Glucose, Bld: 207 mg/dL — ABNORMAL HIGH (ref 70–99)
Potassium: 4.6 mmol/L (ref 3.5–5.1)
Sodium: 143 mmol/L (ref 135–145)

## 2022-07-02 LAB — CBC
HCT: 31.7 % — ABNORMAL LOW (ref 39.0–52.0)
Hemoglobin: 8.8 g/dL — ABNORMAL LOW (ref 13.0–17.0)
MCH: 27.3 pg (ref 26.0–34.0)
MCHC: 27.8 g/dL — ABNORMAL LOW (ref 30.0–36.0)
MCV: 98.4 fL (ref 80.0–100.0)
Platelets: 319 10*3/uL (ref 150–400)
RBC: 3.22 MIL/uL — ABNORMAL LOW (ref 4.22–5.81)
RDW: 17.4 % — ABNORMAL HIGH (ref 11.5–15.5)
WBC: 15.2 10*3/uL — ABNORMAL HIGH (ref 4.0–10.5)
nRBC: 0.3 % — ABNORMAL HIGH (ref 0.0–0.2)

## 2022-07-02 LAB — MAGNESIUM: Magnesium: 2.1 mg/dL (ref 1.7–2.4)

## 2022-07-02 LAB — PHOSPHORUS: Phosphorus: 4.4 mg/dL (ref 2.5–4.6)

## 2022-07-02 MED ORDER — MIDAZOLAM HCL 2 MG/2ML IJ SOLN
INTRAMUSCULAR | Status: AC
  Administered 2022-07-02: 2 mg via INTRAVENOUS
  Filled 2022-07-02: qty 2

## 2022-07-02 MED ORDER — MIDAZOLAM HCL 2 MG/2ML IJ SOLN: 0.5000 mg | INTRAMUSCULAR | Status: DC | PRN

## 2022-07-02 MED ORDER — MIDAZOLAM HCL 2 MG/2ML IJ SOLN
2.0000 mg | Freq: Once | INTRAMUSCULAR | Status: AC
  Administered 2022-07-02: 2 mg via INTRAVENOUS
  Filled 2022-07-02: qty 2

## 2022-07-02 NOTE — Progress Notes (Signed)
eLink Physician-Brief Progress Note Patient Name: Austin Davis DOB: Aug 17, 1940 MRN: [id number]   Date of Service  07/02/2022  HPI/Events of Note  Patient with ventilator dyssynchrony and profound respiratory acidosis, tracheostomy tube position verified with end tidal CO2 and bilateral auscultation, versed 2 mg iv x 1 given to improve sedation, with return of his minute ventilation back to baseline of approximately 10.5 liters.  eICU Interventions  Will give a second dose of Versed 2 mg (since he is still a bit dyssynchronous) and add Versed 1-2 mg iv Q 2 hours PRN to his MAR, ABG in one hour.        Migdalia Dk 07/02/2022, 12:35 AM

## 2022-07-02 NOTE — TOC Progression Note (Signed)
Transition of Care [facility]) - Progression Note    Patient Details  Name: Austin Davis MRN: [id number] Date of Birth: 04/19/1940  [facility] CM/SW Contact  Lorri Frederick, LCSW Phone Number: 07/02/2022, 11:25 AM  Clinical Narrative:   CSW made second attempt to contact DSS guardian, Karen Kays.  Left message regarding potential DC tomorrow.    Expected Discharge Plan: Long Term Acute Care (LTAC) Barriers to Discharge: Continued Medical Work up  Expected Discharge Plan and Services In-house Referral: Clinical Social Work   Post Acute Care Choice:  (TBD) Living arrangements for the past 2 months: Skilled Nursing Facility ([facility])                                       Social Determinants of Health (SDOH) Interventions SDOH Screenings   Alcohol Screen: Low Risk  (04/12/2021)  Tobacco Use: Low Risk  (06/30/2022)    Readmission Risk Interventions    03/18/2022   11:45 AM 03/03/2022   12:04 PM 02/07/2022   11:58 AM  Readmission Risk Prevention Plan  Transportation Screening Complete Complete Complete  Medication Review Oceanographer) Complete Complete   PCP or Specialist appointment within 3-5 days of discharge Complete Complete   HRI or Home Care Consult Complete Complete   SW Recovery Care/Counseling Consult Complete Complete Complete  Palliative Care Screening Not Applicable Not Applicable   Skilled Nursing Facility Complete Complete Complete

## 2022-07-02 NOTE — Progress Notes (Signed)
eLink Physician-Brief Progress Note Patient Name: Austin Davis DOB: 12/30/1940 MRN: [id number]   Date of Service  07/02/2022  HPI/Events of Note  ABG reviewed.  eICU Interventions  Respiratory rate increased to 35, ABG in one hour.        Thomasene Lot Clair Alfieri 07/02/2022, 2:27 AM

## 2022-07-02 NOTE — Progress Notes (Signed)
NAME:  Austin Davis, MRN:  [id number], DOB:  [birth year], LOS: 3 ADMISSION DATE:  06/29/2022, CONSULTATION DATE:  06/29/22  REFERRING MD:  MD Elpidio Anis CHIEF COMPLAINT:  Hypotension   History of Present Illness:  Pt with a significant past medical history of CHF, CAD, Paroxysmal afib, Mitral regurg, Mitral valve prolapse s/p repair, seizure disorder, and Parkinson's disease, trach and peg, and recurrent sepsis with multidrug-resistant organisms (ESBL, Pseudomonas, Klebsiella, and Enterococcus), hx of GI bleeds, hypothyroidism and resides at [facility] presented with complaints of hypotension. Per report from [facility], patient was known to have a HR of 160 on the night of 6/8 and received 5mg  of metoprolol IV x2, which subsequently decreased the patient's SBP to the 70s. Patient received 1L of NS and 500 mls of LR in route from [facility] to [facility] ED. PCCM was consulted for further care and management.   Pertinent  Medical History   Past Medical History:  Diagnosis Date   CHF (congestive heart failure) (HCC)    Coronary artery disease    Essential tremor    GI bleed 2019   hospitalized at [facility] for one week   Headache    since childhood   Low blood sugar    since childhood, controlled by diet   Mitral regurgitation    Mitral valve prolapse    Osteopenia 2021   Paroxysmal atrial fibrillation (HCC)    Seizure-like activity (HCC)    Right gaze preference at baseline, essential termor at baseline, intermittent reduced responsiveness secondary to toxic/metabolic processes and poor cognitive reserve   Tremor      Significant Hospital Events: Including procedures, antibiotic start and stop dates in addition to other pertinent events   6/9 Admitted from [facility] with hypotension, concerns of recurrent sepsis, on trach/vent, went into PEA cardiac arrest, ROSC achieved after 2 minutes  Interim History / Subjective:  Patient became severely hypercapnic overnight and remained unresponsive Now ABGs  better but is still with hypercapnia Remain afebrile and off vasopressor support  Objective   Blood pressure 91/76, pulse 85, temperature (!) 97.1 F (36.2 C), temperature source Axillary, resp. rate (!) 35, height 5\' 10"  (1.778 m), weight 54.4 kg, SpO2 100 %.    Vent Mode: PCV FiO2 (%):  [40 %-100 %] 50 % Set Rate:  [32 bmp-35 bmp] 35 bmp PEEP:  [5 cmH20] 5 cmH20 Plateau Pressure:  [21 cmH20-26 cmH20] 23 cmH20   Intake/Output Summary (Last 24 hours) at 07/02/2022 1130 Last data filed at 07/02/2022 0600 Gross per 24 hour  Intake 1806.72 ml  Output 745 ml  Net 1061.72 ml   Filed Weights   06/29/22 1317 07/01/22 0500 07/02/22 0427  Weight: 55.8 kg 53.5 kg 54.4 kg    Examination: Physical exam: General: Acute on chronically ill-appearing male, lying on the bed HEENT: [place]/AT, eyes anicteric.  moist mucus membranes.  S/p trach Neuro: Awake, tracking examiner, generalized weak Chest: Coarse breath sounds, no wheezes or rhonchi Heart: Regular rate and rhythm, no murmurs or gallops Abdomen: Soft, nontender, nondistended, bowel sounds present Skin: Stage I decubitus ulcer on sacrum, POA  Labs and images were reviewed  Resolved Hospital Problem list   Septic shock Lactic acidosis  Assessment & Plan:  Hx multidrug-resistant organisms (ESBL, Pseudomonas, Klebsiella, and Enterococcus) Recurrent UTI/pneumonia with Pseudomonas UA is consistent with UTI Respiratory culture is growing Pseudomonas Continue to copious amount of secretions White counts are trending down Follow-up sensitivity results on respiratory culture Continue IV meropenem  Status post PEA cardiac  arrest Patient into PEA cardiac arrest likely due to septic shock ROSC was achieved after 2 minutes of CPR and 1 mg of IV epinephrine Continue telemetry monitoring Now awake, following commands  Acute septic encephalopathy Seizure Disorder Parkinson's Disease  Continue Keppra and valproic acid Neuro-protective  measures Seizure precautions  Delirium precautions  Avoid sedation  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 Follow intermittent chest x-ray and ABG.   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 CAD Paroxysmal afib with RVR Monitor intake and output Remain in sinus rhythm Continue amiodarone 200 mg daily Monitor on Cardiac Tele continuously  Anticoagulation was stopped due to drop in hemoglobin Repeat echocardiogram results are pending  Hypothyroidism  Continue synthroid  Severe Malnutrition on Chronic Tube feeds PEG tube leaking around site PEG tube was replaced by IR Continue tube feeds  Pressure Injury: Stage 1 coccyx, POA Continue off loading sacrum Utilize foam dressing  Supportive wound care   Hypernatremia/contraction alkalosis Restarted back on diuretics Continue free water flushes Acetazolamide was stopped due to acidosis  Acute on chronic blood loss anemia Patient's hemoglobin dropped down to 6.5, he had black stools As history of GI bleeding that is why he was not on anticoagulation prior Anticoagulations were discontinued Monitor H&H, he received 1 unit of PRBCs  Best Practice (right click and "Reselect all SmartList Selections" daily)   Diet/type: NPO, tube feeds DVT prophylaxis: SCD GI prophylaxis: PPI Lines: N/A Foley:  N/A Code Status:  full code Last date of multidisciplinary goals of care discussion [6/10: Patient's daughter was updated at bedside, decision was to continue full scope of care  Labs   CBC: Recent Labs  Lab 06/29/22 1332 06/29/22 1744 06/30/22 1000 07/01/22 0328 07/01/22 1925 07/01/22 2234  07/02/22 0200 07/02/22 0359 07/02/22 0603 07/02/22 1118  WBC 24.9*  --  13.8* 10.3  --  17.4*  --   --  15.2*  --   NEUTROABS 23.7*  --   --   --   --  15.0*  --   --   --   --   HGB 8.7*   < > 7.1* 6.5*   < > 9.6* 10.2* 10.2* 8.8* 10.2*  HCT 31.2*   < > 26.1* 23.5*   < > 35.4* 30.0* 30.0* 31.7* 30.0*  MCV 92.6  --  97.8 93.6  --  98.6  --   --  98.4  --   PLT 459*  --  364 329  --  407*  --   --  319  --    < > = values in this interval not displayed.    Basic Metabolic Panel: Recent Labs  Lab 06/29/22 1332 06/29/22 1744 07/01/22 0328 07/01/22 1539 07/01/22 2234 07/02/22 0200 07/02/22 0359 07/02/22 0603 07/02/22 1118  NA 153*   < > 148*  --  143 147* 147*  --  148*  K 4.1   < > 2.3*  --  4.6 4.2 4.3  --  4.0  CL 101  --  101  --  104  --   --   --   --   CO2 41*  --  32  --  32  --   --   --   --  GLUCOSE 129*  --  220*  --  207*  --   --   --   --   BUN 37*  --  21  --  23  --   --   --   --   CREATININE 0.42*  --  0.45*  --  0.59*  --   --   --   --   CALCIUM 8.7*  --  8.4*  --  8.6*  --   --   --   --   MG  --   --  2.0 2.0  --   --   --  2.1  --   PHOS  --   --  2.7 4.4  --   --   --  4.4  --    < > = values in this interval not displayed.   GFR: Estimated Creatinine Clearance: 55.7 mL/min (A) (by C-G formula based on SCr of 0.59 mg/dL (L)). Recent Labs  Lab 06/29/22 1334 06/29/22 1512 06/29/22 1727 06/29/22 1732 06/29/22 2252 06/30/22 1000 07/01/22 0328 07/01/22 2234 07/02/22 0603  PROCALCITON  --   --  0.53  --   --   --   --   --   --   WBC  --   --   --   --   --  13.8* 10.3 17.4* 15.2*  LATICACIDVEN 1.0 0.8  --  3.1* 0.6  --   --   --   --     Liver Function Tests: Recent Labs  Lab 06/29/22 1332 07/01/22 0328  AST 24 19  ALT 37 32  ALKPHOS 166* 87  BILITOT 0.6 0.4  PROT 7.4 5.9*  ALBUMIN 1.7* 2.3*   Recent Labs  Lab 06/29/22 1332  LIPASE 28   No results for input(s): "AMMONIA" in the last 168 hours.  ABG    Component Value  Date/Time   PHART 7.162 (LL) 07/02/2022 1118   PCO2ART 103.1 (HH) 07/02/2022 1118   PO2ART 149 (H) 07/02/2022 1118   HCO3 37.3 (H) 07/02/2022 1118   TCO2 40 (H) 07/02/2022 1118   ACIDBASEDEF 2.0 05/30/2021 0052   O2SAT 98 07/02/2022 1118     Coagulation Profile: Recent Labs  Lab 06/29/22 1727  INR 1.3*    Cardiac Enzymes: No results for input(s): "CKTOTAL", "CKMB", "CKMBINDEX", "TROPONINI" in the last 168 hours.  HbA1C: Hgb A1c MFr Bld  Date/Time Value Ref Range Status  01/04/2022 11:22 PM 5.3 4.8 - 5.6 % Final    Comment:    (NOTE)         Prediabetes: 5.7 - 6.4         Diabetes: >6.4         Glycemic control for adults with diabetes: <7.0   04/11/2021 09:12 PM 5.5 4.8 - 5.6 % Final    Comment:    (NOTE) Pre diabetes:          5.7%-6.4%  Diabetes:              >6.4%  Glycemic control for   <7.0% adults with diabetes     CBG: Recent Labs  Lab 07/01/22 1931 07/01/22 2324 07/02/22 0312 07/02/22 0737 07/02/22 1117  GLUCAP 116* 209* 166* 128* 138*   The patient is critically ill due to septic shock, acute on chronic hypoxic/hypercapnic respiratory failure.  Critical care was necessary to treat or prevent imminent or life-threatening deterioration.  Critical care was time spent personally by me on the following  activities: development of treatment plan with patient and/or surrogate as well as nursing, discussions with consultants, evaluation of patient's response to treatment, examination of patient, obtaining history from patient or surrogate, ordering and performing treatments and interventions, ordering and review of laboratory studies, ordering and review of radiographic studies, pulse oximetry, re-evaluation of patient's condition and participation in multidisciplinary rounds.   During this encounter critical care time was devoted to patient care services described in this note for 33 minutes.     Cheri Fowler, MD [place] See Amion  for pager If no response to pager, please call [phone] until 7pm After 7pm, Please call E-link [phone]

## 2022-07-03 DIAGNOSIS — I469 Cardiac arrest, cause unspecified: Secondary | ICD-10-CM | POA: Diagnosis not present

## 2022-07-03 DIAGNOSIS — A419 Sepsis, unspecified organism: Secondary | ICD-10-CM | POA: Diagnosis not present

## 2022-07-03 LAB — CULTURE, RESPIRATORY W GRAM STAIN

## 2022-07-03 LAB — POCT I-STAT 7, (LYTES, BLD GAS, ICA,H+H)
Acid-Base Excess: 8 mmol/L — ABNORMAL HIGH (ref 0.0–2.0)
Bicarbonate: 36.7 mmol/L — ABNORMAL HIGH (ref 20.0–28.0)
Calcium, Ion: 1.26 mmol/L (ref 1.15–1.40)
HCT: 33 % — ABNORMAL LOW (ref 39.0–52.0)
Hemoglobin: 11.2 g/dL — ABNORMAL LOW (ref 13.0–17.0)
O2 Saturation: 94 %
Patient temperature: 99.1
Potassium: 4.2 mmol/L (ref 3.5–5.1)
Sodium: 151 mmol/L — ABNORMAL HIGH (ref 135–145)
TCO2: 39 mmol/L — ABNORMAL HIGH (ref 22–32)
pCO2 arterial: 79.7 mmHg (ref 32–48)
pH, Arterial: 7.272 — ABNORMAL LOW (ref 7.35–7.45)
pO2, Arterial: 87 mmHg (ref 83–108)

## 2022-07-03 LAB — CBC
HCT: 27.6 % — ABNORMAL LOW (ref 39.0–52.0)
Hemoglobin: 8.1 g/dL — ABNORMAL LOW (ref 13.0–17.0)
MCH: 27.5 pg (ref 26.0–34.0)
MCHC: 29.3 g/dL — ABNORMAL LOW (ref 30.0–36.0)
MCV: 93.6 fL (ref 80.0–100.0)
Platelets: 283 10*3/uL (ref 150–400)
RBC: 2.95 MIL/uL — ABNORMAL LOW (ref 4.22–5.81)
RDW: 17.3 % — ABNORMAL HIGH (ref 11.5–15.5)
WBC: 9.3 10*3/uL (ref 4.0–10.5)
nRBC: 0 % (ref 0.0–0.2)

## 2022-07-03 LAB — COMPREHENSIVE METABOLIC PANEL
ALT: 24 U/L (ref 0–44)
AST: 19 U/L (ref 15–41)
Albumin: 2.1 g/dL — ABNORMAL LOW (ref 3.5–5.0)
Alkaline Phosphatase: 98 U/L (ref 38–126)
Anion gap: 13 (ref 5–15)
BUN: 35 mg/dL — ABNORMAL HIGH (ref 8–23)
CO2: 30 mmol/L (ref 22–32)
Calcium: 8.4 mg/dL — ABNORMAL LOW (ref 8.9–10.3)
Chloride: 106 mmol/L (ref 98–111)
Creatinine, Ser: 0.36 mg/dL — ABNORMAL LOW (ref 0.61–1.24)
GFR, Estimated: >60 mL/min (ref >60)
Glucose, Bld: 151 mg/dL — ABNORMAL HIGH (ref 70–99)
Potassium: 3.9 mmol/L (ref 3.5–5.1)
Sodium: 149 mmol/L — ABNORMAL HIGH (ref 135–145)
Total Bilirubin: 0.3 mg/dL (ref 0.3–1.2)
Total Protein: 6.2 g/dL — ABNORMAL LOW (ref 6.5–8.1)

## 2022-07-03 LAB — CULTURE, BLOOD (ROUTINE X 2)

## 2022-07-03 LAB — GLUCOSE, CAPILLARY
Glucose-Capillary: 117 mg/dL — ABNORMAL HIGH (ref 70–99)
Glucose-Capillary: 134 mg/dL — ABNORMAL HIGH (ref 70–99)

## 2022-07-03 MED ORDER — FUROSEMIDE 10 MG/ML IJ SOLN
40.0000 mg | Freq: Two times a day (BID) | INTRAMUSCULAR | Status: DC
  Administered 2022-07-03 (×2): 40 mg via INTRAVENOUS
  Filled 2022-07-03 (×3): qty 4

## 2022-07-03 NOTE — TOC Progression Note (Signed)
Transition of Care [facility]) - Progression Note    Patient Details  Name: Austin Davis MRN: [id number] Date of Birth: [birth year]  Transition of Care [facility]) CM/SW Contact  Harriet Masson, RN Phone Number: 07/03/2022, 2:37 PM  Clinical Narrative:     Left VM for Sandi Mariscal, legal guardian, to notify him that patient will transfer to [facility] tomorrow.   Expected Discharge Plan: Long Term Acute Care (LTAC) Barriers to Discharge: Continued Medical Work up  Expected Discharge Plan and Services In-house Referral: Clinical Social Work   Post Acute Care Choice:  (TBD) Living arrangements for the past 2 months: Skilled Nursing Facility ([facility])                                       Social Determinants of Health (SDOH) Interventions SDOH Screenings   Alcohol Screen: Low Risk  (04/12/2021)  Tobacco Use: Low Risk  (06/30/2022)    Readmission Risk Interventions    03/18/2022   11:45 AM 03/03/2022   12:04 PM 02/07/2022   11:58 AM  Readmission Risk Prevention Plan  Transportation Screening Complete Complete Complete  Medication Review Ocean[employer]) Complete Complete   PCP or Specialist appointment within 3-5 days of discharge Complete Complete   HRI or Home Care Consult Complete Complete   SW Recovery Care/Counseling Consult Complete Complete Complete  Palliative Care Screening Not Applicable Not Applicable   Skilled Nursing Facility Complete Complete Complete

## 2022-07-03 NOTE — Progress Notes (Signed)
NAME:  Austin Davis, MRN:  [id number], DOB:  08-Feb-1940, LOS: 4 ADMISSION DATE:  06/29/2022, CONSULTATION DATE:  06/29/22  REFERRING MD:  MD Elpidio Anis CHIEF COMPLAINT:  Hypotension   History of Present Illness:  Pt with a significant past medical history of CHF, CAD, Paroxysmal afib, Mitral regurg, Mitral valve prolapse s/p repair, seizure disorder, and Parkinson's disease, trach and peg, and recurrent sepsis with multidrug-resistant organisms (ESBL, Pseudomonas, Klebsiella, and Enterococcus), hx of GI bleeds, hypothyroidism and resides at [facility] presented with complaints of hypotension. Per report from [facility], patient was known to have a HR of 160 on the night of 6/8 and received 5mg  of metoprolol IV x2, which subsequently decreased the patient's SBP to the 70s. Patient received 1L of NS and 500 mls of LR in route from [facility] to [facility] ED. PCCM was consulted for further care and management.   Pertinent  Medical History   Past Medical History:  Diagnosis Date   CHF (congestive heart failure) (HCC)    Coronary artery disease    Essential tremor    GI bleed 2019   hospitalized at [facility] for one week   Headache    since childhood   Low blood sugar    since childhood, controlled by diet   Mitral regurgitation    Mitral valve prolapse    Osteopenia 2021   Paroxysmal atrial fibrillation (HCC)    Seizure-like activity (HCC)    Right gaze preference at baseline, essential termor at baseline, intermittent reduced responsiveness secondary to toxic/metabolic processes and poor cognitive reserve   Tremor      Significant Hospital Events: Including procedures, antibiotic start and stop dates in addition to other pertinent events   6/9 Admitted from [facility] with hypotension, concerns of recurrent sepsis, on trach/vent, went into PEA cardiac arrest, ROSC achieved after 2 minutes  Interim History / Subjective:  Placed back on pressure control mode Remained afebrile  Objective   Blood  pressure 107/68, pulse 83, temperature 99.1 F (37.3 C), temperature source Oral, resp. rate (!) 30, height 5\' 10"  (1.778 m), weight 55.6 kg, SpO2 100 %.    Vent Mode: PRVC FiO2 (%):  [40 %-50 %] 40 % Set Rate:  [30 bmp] 30 bmp Vt Set:  [430 mL] 430 mL PEEP:  [5 cmH20] 5 cmH20 Plateau Pressure:  [17 cmH20-29 cmH20] 24 cmH20   Intake/Output Summary (Last 24 hours) at 07/03/2022 0844 Last data filed at 07/03/2022 9147 Gross per 24 hour  Intake 1694.89 ml  Output 1700 ml  Net -5.11 ml   Filed Weights   07/01/22 0500 07/02/22 0427 07/03/22 0500  Weight: 53.5 kg 54.4 kg 55.6 kg    Examination: Physical exam: General: Chronically ill-appearing male, lying on the bed HEENT: [place]/AT, eyes anicteric.  moist mucus membranes Neuro: Awake, tracking examiner, following simple commands.  Has coarse tremors Chest: Coarse breath sounds, no wheezes or rhonchi Heart: Regular rate and rhythm, no murmurs or gallops Abdomen: Soft, nontender, nondistended, bowel sounds present Skin: Stage I decubitus ulcer on sacrum, POA  Labs and images were reviewed  Resolved Hospital Problem list   Septic shock Lactic acidosis  Assessment & Plan:  Hx multidrug-resistant organisms (ESBL, Pseudomonas, Klebsiella, and Enterococcus) Recurrent UTI/pneumonia with Pseudomonas UA is consistent with UTI Respiratory culture is growing Pseudomonas which is pansensitive Overall respiratory secretions have improved White count trended down to 9 Continue IV meropenem  Status post PEA cardiac arrest Patient into PEA cardiac arrest likely due to septic shock ROSC  was achieved after 2 minutes of CPR and 1 mg of IV epinephrine Continue telemetry monitoring Now awake, following commands  Acute septic encephalopathy Seizure Disorder Parkinson's Disease  Continue Keppra and valproic acid Neuro-protective measures Seizure precautions  Delirium precautions  Avoid sedation  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 CAD Paroxysmal afib with RVR Monitor intake and output Remain in sinus rhythm Continue amiodarone 200 mg daily Monitor on Cardiac Tele continuously  Patient is not on anticoagulation due to GI bleeding and recurrent anemia  Hypothyroidism  Continue synthroid  Severe Malnutrition on Chronic Tube feeds PEG tube leaking around site PEG tube was replaced by IR again on this visit Continue tube feeds  Pressure Injury: Stage 1 coccyx, POA Continue off loading sacrum Utilize foam dressing  Supportive wound care   Hypernatremia/contraction alkalosis Continue Lasix 40 mg twice daily Continue free water flushes  Acute on chronic blood loss anemia Patient's hemoglobin dropped down to 6.5, he had black stools As history of GI bleeding that is why he was not on anticoagulation prior Anticoagulations were discontinued Monitor H&H, he received 1 unit of PRBCs  Best Practice (right click and "Reselect all SmartList Selections" daily)   Diet/type: NPO, tube feeds DVT prophylaxis: SCD GI prophylaxis: PPI Lines: N/A Foley:  N/A Code Status:  full code Last date of multidisciplinary goals of care discussion [6/10: Patient's daughter was updated at bedside, decision was to continue full scope of care  Labs   CBC: Recent Labs  Lab 06/29/22 1332 06/29/22 1744 06/30/22 1000 07/01/22 0328 07/01/22 1925 07/01/22 2234 07/02/22 0200 07/02/22 0359 07/02/22 0603 07/02/22 1118 07/02/22 1353 07/03/22 0236  WBC 24.9*  --  13.8* 10.3  --  17.4*  --   --   15.2*  --   --  9.3  NEUTROABS 23.7*  --   --   --   --  15.0*  --   --   --   --   --   --   HGB 8.7*   < > 7.1* 6.5*   < > 9.6*   < > 10.2* 8.8* 10.2* 9.2* 8.1*  HCT 31.2*   < > 26.1* 23.5*   < > 35.4*   < > 30.0* 31.7* 30.0* 27.0* 27.6*  MCV 92.6  --  97.8 93.6  --  98.6  --   --  98.4  --   --  93.6  PLT 459*  --  364 329  --  407*  --   --  319  --   --  283   < > = values in this interval not displayed.    Basic Metabolic Panel: Recent Labs  Lab 06/29/22 1332 06/29/22 1744 07/01/22 0328 07/01/22 1539 07/01/22 2234 07/02/22 0200 07/02/22 0359 07/02/22 0603 07/02/22 1118 07/02/22 1353 07/03/22 0236  NA 153*   < > 148*  --  143 147* 147*  --  148* 146* 149*  K 4.1   < > 2.3*  --  4.6 4.2 4.3  --  4.0 4.1 3.9  CL 101  --  101  --  104  --   --   --   --   --  106  CO2 41*  --  32  --  32  --   --   --   --   --  30  GLUCOSE 129*  --  220*  --  207*  --   --   --   --   --  151*  BUN 37*  --  21  --  23  --   --   --   --   --  35*  CREATININE 0.42*  --  0.45*  --  0.59*  --   --   --   --   --  0.36*  CALCIUM 8.7*  --  8.4*  --  8.6*  --   --   --   --   --  8.4*  MG  --   --  2.0 2.0  --   --   --  2.1  --   --   --   PHOS  --   --  2.7 4.4  --   --   --  4.4  --   --   --    < > = values in this interval not displayed.   GFR: Estimated Creatinine Clearance: 57 mL/min (A) (by C-G formula based on SCr of 0.36 mg/dL (L)). Recent Labs  Lab 06/29/22 1334 06/29/22 1512 06/29/22 1727 06/29/22 1732 06/29/22 2252 06/30/22 1000 07/01/22 0328 07/01/22 2234 07/02/22 0603 07/03/22 0236  PROCALCITON  --   --  0.53  --   --   --   --   --   --   --   WBC  --   --   --   --   --    < > 10.3 17.4* 15.2* 9.3  LATICACIDVEN 1.0 0.8  --  3.1* 0.6  --   --   --   --   --    < > = values in this interval not displayed.    Liver Function Tests: Recent Labs  Lab 06/29/22 1332 07/01/22 0328 07/03/22 0236  AST 24 19 19   ALT 37 32 24  ALKPHOS 166* 87 98  BILITOT 0.6 0.4 0.3   PROT 7.4 5.9* 6.2*  ALBUMIN 1.7* 2.3* 2.1*   Recent Labs  Lab 06/29/22 1332  LIPASE 28   No results for input(s): "AMMONIA" in the last 168 hours.  ABG    Component Value Date/Time   PHART 7.257 (L) 07/02/2022 1353   PCO2ART 83.2 (HH) 07/02/2022 1353   PO2ART 109 (H) 07/02/2022 1353   HCO3 37.4 (H) 07/02/2022 1353   TCO2 40 (H) 07/02/2022 1353   ACIDBASEDEF 2.0 05/30/2021 0052   O2SAT 97 07/02/2022 1353     Coagulation Profile: Recent Labs  Lab 06/29/22 1727  INR 1.3*    Cardiac Enzymes: No results for input(s): "CKTOTAL", "CKMB", "CKMBINDEX", "TROPONINI" in the last 168 hours.  HbA1C: Hgb A1c MFr Bld  Date/Time Value Ref Range Status  01/04/2022 11:22 PM 5.3 4.8 - 5.6 % Final    Comment:    (NOTE)         Prediabetes: 5.7 - 6.4         Diabetes: >6.4         Glycemic control for adults with diabetes: <7.0   04/11/2021 09:12 PM 5.5 4.8 - 5.6 % Final    Comment:    (  NOTE) Pre diabetes:          5.7%-6.4%  Diabetes:              >6.4%  Glycemic control for   <7.0% adults with diabetes     CBG: Recent Labs  Lab 07/02/22 1519 07/02/22 1936 07/02/22 2341 07/03/22 0349 07/03/22 0802  GLUCAP 145* 148* 115* 117* 134*   The patient is critically ill due to septic shock, acute on chronic hypoxic/hypercapnic respiratory failure.  Critical care was necessary to treat or prevent imminent or life-threatening deterioration.  Critical care was time spent personally by me on the following activities: development of treatment plan with patient and/or surrogate as well as nursing, discussions with consultants, evaluation of patient's response to treatment, examination of patient, obtaining history from patient or surrogate, ordering and performing treatments and interventions, ordering and review of laboratory studies, ordering and review of radiographic studies, pulse oximetry, re-evaluation of patient's condition and participation in multidisciplinary rounds.    During this encounter critical care time was devoted to patient care services described in this note for 32 minutes.     Cheri Fowler, MD [facility] See Amion for pager If no response to pager, please call [phone] until 7pm After 7pm, Please call E-link [phone]

## 2022-07-04 DIAGNOSIS — Z430 Encounter for attention to tracheostomy: Secondary | ICD-10-CM | POA: Diagnosis not present

## 2022-07-04 DIAGNOSIS — R1311 Dysphagia, oral phase: Secondary | ICD-10-CM | POA: Diagnosis not present

## 2022-07-04 DIAGNOSIS — Z930 Tracheostomy status: Secondary | ICD-10-CM | POA: Diagnosis not present

## 2022-07-04 DIAGNOSIS — G20B1 Parkinson's disease with dyskinesia, without mention of fluctuations: Secondary | ICD-10-CM | POA: Diagnosis not present

## 2022-07-04 DIAGNOSIS — F411 Generalized anxiety disorder: Secondary | ICD-10-CM | POA: Diagnosis not present

## 2022-07-04 DIAGNOSIS — G9341 Metabolic encephalopathy: Secondary | ICD-10-CM | POA: Diagnosis not present

## 2022-07-04 DIAGNOSIS — Z8674 Personal history of sudden cardiac arrest: Secondary | ICD-10-CM | POA: Diagnosis not present

## 2022-07-04 DIAGNOSIS — I5042 Chronic combined systolic (congestive) and diastolic (congestive) heart failure: Secondary | ICD-10-CM | POA: Diagnosis not present

## 2022-07-04 DIAGNOSIS — J189 Pneumonia, unspecified organism: Secondary | ICD-10-CM | POA: Diagnosis not present

## 2022-07-04 DIAGNOSIS — G20A1 Parkinson's disease without dyskinesia, without mention of fluctuations: Secondary | ICD-10-CM | POA: Diagnosis not present

## 2022-07-04 DIAGNOSIS — R1312 Dysphagia, oropharyngeal phase: Secondary | ICD-10-CM | POA: Diagnosis not present

## 2022-07-04 DIAGNOSIS — A4152 Sepsis due to Pseudomonas: Secondary | ICD-10-CM | POA: Diagnosis not present

## 2022-07-04 DIAGNOSIS — J151 Pneumonia due to Pseudomonas: Secondary | ICD-10-CM | POA: Diagnosis not present

## 2022-07-04 DIAGNOSIS — D638 Anemia in other chronic diseases classified elsewhere: Secondary | ICD-10-CM | POA: Diagnosis not present

## 2022-07-04 DIAGNOSIS — F331 Major depressive disorder, recurrent, moderate: Secondary | ICD-10-CM | POA: Diagnosis not present

## 2022-07-04 DIAGNOSIS — R6520 Severe sepsis without septic shock: Secondary | ICD-10-CM

## 2022-07-04 DIAGNOSIS — I4891 Unspecified atrial fibrillation: Secondary | ICD-10-CM | POA: Diagnosis not present

## 2022-07-04 DIAGNOSIS — R5381 Other malaise: Secondary | ICD-10-CM | POA: Diagnosis not present

## 2022-07-04 DIAGNOSIS — A419 Sepsis, unspecified organism: Secondary | ICD-10-CM

## 2022-07-04 DIAGNOSIS — K9423 Gastrostomy malfunction: Secondary | ICD-10-CM | POA: Diagnosis not present

## 2022-07-04 DIAGNOSIS — J440 Chronic obstructive pulmonary disease with acute lower respiratory infection: Secondary | ICD-10-CM | POA: Diagnosis not present

## 2022-07-04 DIAGNOSIS — I480 Paroxysmal atrial fibrillation: Secondary | ICD-10-CM | POA: Diagnosis not present

## 2022-07-04 DIAGNOSIS — F419 Anxiety disorder, unspecified: Secondary | ICD-10-CM | POA: Diagnosis not present

## 2022-07-04 DIAGNOSIS — I2510 Atherosclerotic heart disease of native coronary artery without angina pectoris: Secondary | ICD-10-CM | POA: Diagnosis not present

## 2022-07-04 DIAGNOSIS — Z1612 Extended spectrum beta lactamase (ESBL) resistance: Secondary | ICD-10-CM | POA: Diagnosis not present

## 2022-07-04 DIAGNOSIS — R6521 Severe sepsis with septic shock: Secondary | ICD-10-CM | POA: Diagnosis not present

## 2022-07-04 DIAGNOSIS — J9690 Respiratory failure, unspecified, unspecified whether with hypoxia or hypercapnia: Secondary | ICD-10-CM | POA: Diagnosis not present

## 2022-07-04 DIAGNOSIS — J449 Chronic obstructive pulmonary disease, unspecified: Secondary | ICD-10-CM | POA: Diagnosis not present

## 2022-07-04 DIAGNOSIS — J9620 Acute and chronic respiratory failure, unspecified whether with hypoxia or hypercapnia: Secondary | ICD-10-CM | POA: Diagnosis not present

## 2022-07-04 DIAGNOSIS — R2689 Other abnormalities of gait and mobility: Secondary | ICD-10-CM | POA: Diagnosis not present

## 2022-07-04 DIAGNOSIS — Z9911 Dependence on respirator [ventilator] status: Secondary | ICD-10-CM | POA: Diagnosis not present

## 2022-07-04 DIAGNOSIS — R4189 Other symptoms and signs involving cognitive functions and awareness: Secondary | ICD-10-CM | POA: Diagnosis not present

## 2022-07-04 DIAGNOSIS — J9621 Acute and chronic respiratory failure with hypoxia: Secondary | ICD-10-CM | POA: Diagnosis not present

## 2022-07-04 DIAGNOSIS — L89151 Pressure ulcer of sacral region, stage 1: Secondary | ICD-10-CM | POA: Diagnosis not present

## 2022-07-04 DIAGNOSIS — I4892 Unspecified atrial flutter: Secondary | ICD-10-CM | POA: Diagnosis not present

## 2022-07-04 DIAGNOSIS — R0989 Other specified symptoms and signs involving the circulatory and respiratory systems: Secondary | ICD-10-CM | POA: Diagnosis not present

## 2022-07-04 DIAGNOSIS — Z952 Presence of prosthetic heart valve: Secondary | ICD-10-CM | POA: Diagnosis not present

## 2022-07-04 DIAGNOSIS — R109 Unspecified abdominal pain: Secondary | ICD-10-CM | POA: Diagnosis not present

## 2022-07-04 DIAGNOSIS — Z431 Encounter for attention to gastrostomy: Secondary | ICD-10-CM | POA: Diagnosis not present

## 2022-07-04 DIAGNOSIS — R251 Tremor, unspecified: Secondary | ICD-10-CM | POA: Diagnosis not present

## 2022-07-04 DIAGNOSIS — E870 Hyperosmolality and hypernatremia: Secondary | ICD-10-CM | POA: Insufficient documentation

## 2022-07-04 DIAGNOSIS — J9601 Acute respiratory failure with hypoxia: Secondary | ICD-10-CM | POA: Diagnosis not present

## 2022-07-04 DIAGNOSIS — G9340 Encephalopathy, unspecified: Secondary | ICD-10-CM | POA: Diagnosis not present

## 2022-07-04 DIAGNOSIS — R000 Tachycardia, unspecified: Secondary | ICD-10-CM | POA: Diagnosis not present

## 2022-07-04 LAB — CBC
HCT: 30 % — ABNORMAL LOW (ref 39.0–52.0)
Hemoglobin: 8.7 g/dL — ABNORMAL LOW (ref 13.0–17.0)
MCH: 26.9 pg (ref 26.0–34.0)
MCHC: 29 g/dL — ABNORMAL LOW (ref 30.0–36.0)
MCV: 92.6 fL (ref 80.0–100.0)
Platelets: 310 10*3/uL (ref 150–400)
RBC: 3.24 MIL/uL — ABNORMAL LOW (ref 4.22–5.81)
RDW: 17.3 % — ABNORMAL HIGH (ref 11.5–15.5)
WBC: 11.4 10*3/uL — ABNORMAL HIGH (ref 4.0–10.5)
nRBC: 0 % (ref 0.0–0.2)

## 2022-07-04 LAB — POCT I-STAT 7, (LYTES, BLD GAS, ICA,H+H)
Acid-Base Excess: 16 mmol/L — ABNORMAL HIGH (ref 0.0–2.0)
Bicarbonate: 41.7 mmol/L — ABNORMAL HIGH (ref 20.0–28.0)
Calcium, Ion: 1.19 mmol/L (ref 1.15–1.40)
HCT: 28 % — ABNORMAL LOW (ref 39.0–52.0)
Hemoglobin: 9.5 g/dL — ABNORMAL LOW (ref 13.0–17.0)
O2 Saturation: 99 %
Patient temperature: 98.7
Potassium: 4 mmol/L (ref 3.5–5.1)
Sodium: 147 mmol/L — ABNORMAL HIGH (ref 135–145)
TCO2: 43 mmol/L — ABNORMAL HIGH (ref 22–32)
pCO2 arterial: 59.7 mmHg — ABNORMAL HIGH (ref 32–48)
pH, Arterial: 7.452 — ABNORMAL HIGH (ref 7.35–7.45)
pO2, Arterial: 115 mmHg — ABNORMAL HIGH (ref 83–108)

## 2022-07-04 LAB — CULTURE, BLOOD (ROUTINE X 2)
Culture: NO GROWTH
Culture: NO GROWTH
Special Requests: ADEQUATE

## 2022-07-04 MED ORDER — AMIODARONE IV BOLUS ONLY 150 MG/100ML
150.0000 mg | Freq: Once | INTRAVENOUS | Status: AC
  Administered 2022-07-04: 150 mg via INTRAVENOUS

## 2022-07-04 MED ORDER — METOPROLOL TARTRATE 5 MG/5ML IV SOLN
2.5000 mg | Freq: Once | INTRAVENOUS | Status: AC
  Administered 2022-07-04: 2.5 mg via INTRAVENOUS
  Filled 2022-07-04: qty 5

## 2022-07-04 MED ORDER — SODIUM CHLORIDE 0.9 % IV SOLN: 1.0000 g | Freq: Three times a day (TID) | INTRAVENOUS | Status: DC

## 2022-07-04 MED ORDER — AMIODARONE HCL 200 MG PO TABS: 200.0000 mg | ORAL_TABLET | Freq: Every day | ORAL | Status: DC

## 2022-07-04 MED ORDER — AMIODARONE IV BOLUS ONLY 150 MG/100ML
INTRAVENOUS | Status: AC
  Filled 2022-07-04: qty 100

## 2022-07-04 MED ORDER — METOPROLOL TARTRATE 25 MG PO TABS
25.0000 mg | ORAL_TABLET | Freq: Two times a day (BID) | ORAL | Status: DC
  Administered 2022-07-04: 25 mg
  Filled 2022-07-04: qty 1

## 2022-07-04 MED ORDER — FENTANYL CITRATE PF 50 MCG/ML IJ SOSY: 12.5000 ug | PREFILLED_SYRINGE | INTRAMUSCULAR | 0 refills | Status: DC | PRN

## 2022-07-04 MED ORDER — METOPROLOL TARTRATE 25 MG PO TABS: 25.0000 mg | ORAL_TABLET | Freq: Two times a day (BID) | ORAL | Status: DC

## 2022-07-04 NOTE — Progress Notes (Signed)
MD made aware of Pts HR in (afib RVR) 150-160. New orders in

## 2022-07-04 NOTE — Progress Notes (Signed)
[facility] bedside.  

## 2022-07-04 NOTE — Consult Note (Addendum)
Consult placed for purulence from around G tube site.  This RN did assess area, no significant skin breakdown noted, mild erythema. Overall skin appears much improved as compared to  picture taken 06/29/2022 on admission.  I did observe purulent drainage that appears to be coming from G tube tract.  Ordered Drawtex dressing [place] [phone])  to help absorb drainage.   Order placed for bedside staff:  Clean around G tube site with NS, dry and apply Drawtex dressing [place] [phone]) around G tube.  Change daily or as becomes saturated with effluent.    POC discussed with bedside nurse.  WOC team will not follow at this time. Re-consult if further needs arise.   Thank you,    Priscella Mann MSN, RN-BC, [employer] [phone]

## 2022-07-04 NOTE — Discharge Summary (Signed)
Physician Discharge Summary         Patient ID: Austin Davis MRN: [id number] DOB/AGE: [birth year] 82 y.o.  Admit date: 06/29/2022 Discharge date: 07/04/2022  Discharge Diagnoses:    Active Hospital Problems   Diagnosis Date Noted   Sepsis with metabolic encephalopathy (HCC) 40/98/1191   Pseudomonal pneumonia (HCC) 07/04/2022    Priority: 1.   Ventilator dependent [facility])     Priority: 1.   Chronic obstructive pulmonary disease (HCC) 10/24/2020    Priority: 1.   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 05/15/2021   Urinary tract infection associated with indwelling urethral catheter (HCC) 05/08/2021   Pressure injury of skin 04/30/2021   Seizure disorder (HCC) 04/29/2021   Parkinsonism 03/25/2021   Chronic heart failure with preserved ejection fraction (HCC) 10/24/2020   Protein-calorie malnutrition, severe 09/17/2020   Failure to thrive in adult 09/16/2020   Status post mitral valve annuloplasty and MAZE 2005 12/12/2014    Resolved Hospital Problems   Diagnosis Date Noted Date Resolved   Acute on chronic respiratory failure with hypoxia and hypercapnia (HCC) 09/16/2020 07/04/2022    Priority: 2.   Septic shock (HCC) 07/04/2022 07/04/2022   PEG tube malfunction (HCC) 03/17/2022 07/04/2022   Cardiac arrest due to respiratory disorder [facility]) 06/07/2021 07/04/2022   Paroxysmal atrial fibrillation (HCC) 05/08/2012 07/04/2022      Discharge summary    Pt with a significant past medical history of CHF, CAD, Paroxysmal afib, Mitral regurg, Mitral valve prolapse s/p repair, seizure disorder, and Parkinson's disease, trach and peg, and recurrent sepsis with multidrug-resistant organisms (ESBL, Pseudomonas, Klebsiella, and Enterococcus), hx of GI bleeds, hypothyroidism and resides at [facility] presented with complaints of hypotension. Per report  from [facility], patient was known to have a HR of 160 on the night of 6/8 and received 5mg  of metoprolol IV x2, which subsequently decreased the patient's SBP to the 70s. Patient received 1L of NS and 500 mls of LR in route from [facility] to [facility] ED. PCCM was consulted for further care and management.    Hospital course  6/9 Admitted from [facility] with hypotension, concerns of recurrent sepsis, on trach/vent, went into PEA cardiac arrest, ROSC achieved after 2 minutes. Remained in shock, further c/b af w/ RVR. Cardioverted. CVL placed, arterial line placed. Cultures sent. Antibiotics started.  6/10 in and out of AF. Amiodarone infusion started. Still on levophed gtt. G tube replaced by IR as it was leaking.  6/11 got 1 unit PRBC for hgb 6.5. sputum growing pseudinomas. Pressors off . Continued on Meropenem. Zyvox stopped.  6/12 vent adjustments required for increased hypercapnia  6/13 changed back to PCV.   6/14 stable to transfer back to [facility]   Discharge Plan by Active Problems    Hx multidrug-resistant organisms (ESBL, Pseudomonas, Klebsiella, and Enterococcus) Recurrent UTI/pneumonia with Pseudomonas Plan Continue IV meropenem day 6 of 10   Acute septic encephalopathy Seizure Disorder Parkinson's Disease  Plan Continue Keppra and valproic acid Neuro-protective measures Seizure precautions  Delirium precautions  Avoid sedation   Trach and vent dependent has Shiley XLT Distal 6mm cuffed  plan Continue PCV  PC: 32 PEEP of 5 RR 20 fio2 40% VAP prevention  Head of bed elevated 30 degrees.   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 CAD Paroxysmal afib with RVR (now NSR) plan Continue amiodarone 200 mg daily Monitor on Cardiac Tele  continuously  Patient is not on anticoagulation due to GI bleeding and recurrent anemia Continue lasix  Hypothyroidism  Plan Continue synthroid   Severe Malnutrition on Chronic Tube feeds PEG tube leaking around site PEG  tube was replaced by IR again on this visit Plan Continue tube feeds   Pressure Injury: Stage 1 coccyx, POA Plan Continue off loading sacrum Utilize foam dressing  Supportive wound care    Hypernatremia/contraction alkalosis plan Continue free water flushes   Acute on chronic blood loss anemia Patient's hemoglobin dropped down to 6.5, he had black stools As history of GI bleeding that is why he was not on anticoagulation prior Anticoagulations were discontinued Monitor H&H, he received 1 unit of PRBCs Plan Monitor    Discharge Exam: Blood Pressure (Abnormal) 157/118   Pulse (Abnormal) 127   Temperature 98.2 F (36.8 C) (Axillary)   Respiration (Abnormal) 30   Height 5\' 10"  (1.778 m)   Weight 52.7 kg   Oxygen Saturation 97%   Body Mass Index 16.67 kg/m   General: Chronically ill-appearing male, lying on the bed HEENT: [place]/AT, eyes anicteric.  moist mucus membranes Neuro: Awake, tracking examiner, following simple commands.  Has coarse tremors Chest: Coarse breath sounds, no wheezes or rhonchi Heart: Regular rate and rhythm, no murmurs or gallops Abdomen: Soft, nontender, nondistended, bowel sounds present Skin: Stage I decubitus ulcer on sacrum, POA   Labs at discharge   Lab Results  Component Value Date   CREATININE 0.36 (L) 07/03/2022   BUN 35 (H) 07/03/2022   NA 147 (H) 07/04/2022   K 4.0 07/04/2022   CL 106 07/03/2022   CO2 30 07/03/2022   Lab Results  Component Value Date   WBC 11.4 (H) 07/04/2022   HGB 9.5 (L) 07/04/2022   HCT 28.0 (L) 07/04/2022   MCV 92.6 07/04/2022   PLT 310 07/04/2022   Lab Results  Component Value Date   ALT 24 07/03/2022   AST 19 07/03/2022   ALKPHOS 98 07/03/2022   BILITOT 0.3 07/03/2022   Lab Results  Component Value Date   INR 1.3 (H) 06/29/2022   INR 1.1 03/12/2022   INR 1.3 (H) 01/04/2022    Current radiological studies    No results found.  Disposition:    Discharge disposition: 03-Skilled Nursing  Facility       Discharge Instructions     Diet - low sodium heart healthy   Complete by: As directed    Discharge wound care:   Complete by: As directed    Continue off loading sacrum Utilize foam dressing  Supportive wound care   Increase activity slowly   Complete by: As directed        Allergies as of 07/04/2022     Allergen Reactions Comments   Prednisone Other (See Comments) Documented on MAR Unknown reaction   Cortisone Other (See Comments) Documented on MAR Unknown reaction        Medication List     Stop taking these medications    metoprolol tartrate 25 MG tablet Commonly known as: LOPRESSOR   nystatin powder Commonly known as: MYCOSTATIN/NYSTOP   propranolol 10 MG tablet Commonly known as: INDERAL   Sodium Chloride (Inhalant) 7 % Nebu       Take these medications    acetaminophen 325 MG tablet Commonly known as: TYLENOL Place 650 mg into feeding tube every 8 (eight) hours as needed for fever, headache or moderate pain.   ALPRAZolam 0.5 MG tablet Commonly known as: [employer]  0.5 mg into feeding tube every 8 (eight) hours as needed for anxiety.   amiodarone 200 MG tablet Commonly known as: PACERONE Place 1 tablet (200 mg total) into feeding tube daily. Start taking on: July 05, 2022   CULTURELLE PO Place 1 capsule into feeding tube in the morning.   esomeprazole 40 MG capsule Commonly known as: NEXIUM 40 mg See admin instructions. 40 mg via feeding tube once daily.   feeding supplement (OSMOLITE 1.5 CAL) Liqd Place 60 mL/hr into feeding tube continuous.   fentaNYL 50 MCG/ML injection Commonly known as: SUBLIMAZE Inject 0.25 mLs (12.5 mcg total) into the vein every 2 (two) hours as needed (to achieve RASS & CPOT goal.).   Fish Oil 1000 MG Caps Place 1,000 mg into feeding tube every morning.   fluticasone 50 MCG/ACT nasal spray Commonly known as: FLONASE Place 1 spray into both nostrils every 6 (six) hours as needed for  allergies.   folic acid 1 MG tablet Commonly known as: FOLVITE Take 1 tablet (1 mg total) by mouth daily. What changed: how to take this   furosemide 20 MG tablet Commonly known as: LASIX Place 1 tablet (20 mg total) into feeding tube daily as needed for fluid or edema.   hydrOXYzine 25 MG tablet Commonly known as: ATARAX Place 25 mg into feeding tube every 6 (six) hours as needed for anxiety. What changed: Another medication with the same name was removed. Continue taking this medication, and follow the directions you see here.   lactulose 10 GM/15ML solution Commonly known as: CHRONULAC Place 20 g into feeding tube every 12 (twelve) hours as needed (constipation).   levalbuterol 1.25 MG/3ML nebulizer solution Commonly known as: XOPENEX Take 1.25 mg by nebulization every 6 (six) hours.   levothyroxine 25 MCG tablet Commonly known as: SYNTHROID Place 25 mcg into feeding tube every morning.   melatonin 3 MG Tabs tablet Place 6 mg into feeding tube at bedtime.   meropenem 1 g in sodium chloride 0.9 % 100 mL Inject 1 g into the vein every 8 (eight) hours.   midodrine 10 MG tablet Commonly known as: PROAMATINE Place 1 tablet (10 mg total) into feeding tube 3 (three) times daily with meals.   multivitamins with iron Tabs tablet 1 tablet See admin instructions. 1 tablet via feeding tube once daily.   polyethylene glycol 17 g packet Commonly known as: MIRALAX / GLYCOLAX Place 17 g into feeding tube every other day.   sertraline 100 MG tablet Commonly known as: ZOLOFT Place 100 mg into feeding tube daily.   sodium chloride flush 0.9 % Soln Commonly known as: NS Inject 10 mLs into the vein See admin instructions. 2 entries on MAR: 1) Use 10 mL IV every shift for all unused lumens. Flush each unused lumen. 2) Flush with 10 mL of normal saline before medications and 10 mL after medications   tamsulosin 0.4 MG Caps capsule Commonly known as: FLOMAX 0.4 mg See admin  instructions. 0.4 mg via feeding tube twice daily.          Discharge Care Instructions  (From admission, onward)           Start     Ordered   07/04/22 0000  Discharge wound care:       Comments: Continue off loading sacrum Utilize foam dressing  Supportive wound care   07/04/22 1220             Follow-up appointment   NA Discharge Condition:  stable  Physician Statement:   The Patient was personally examined, the discharge assessment and plan has been personally reviewed and I agree with ACNP Nyima Vanacker's assessment and plan. 34  minutes of time have been dedicated to discharge assessment, planning and discharge instructions.   Signed: Shelby Mattocks 07/04/2022, 12:20 PM

## 2022-07-04 NOTE — Progress Notes (Signed)
Report called to transferring hospital

## 2022-07-04 NOTE — Progress Notes (Signed)
MD made aware of soft pressures ( follow epic), okay to hold lasix

## 2022-07-04 NOTE — Progress Notes (Signed)
MD made aware of discharge from G tube

## 2022-07-04 NOTE — TOC Transition Note (Signed)
Transition of Care [facility]) - CM/SW Discharge Note   Patient Details  Name: Austin Davis MRN: [id number] Date of Birth: 04/16/1940  Transition of Care [facility]) CM/SW Contact:  Harriet Masson, RN Phone Number: 07/04/2022, 12:28 PM   Clinical Narrative:     Irving Burton with [facility] has LTAC bed today.  Bedside RN to call report to [phone]. Dr. Lum Keas will be medical doctor. Bedside RN to call [facility] when patient is ready.   Final next level of care: Long Term Nursing Home Barriers to Discharge: Barriers Resolved   Patient Goals and CMS Choice      Discharge Placement                 [facility]        Discharge Plan and Services Additional resources added to the After Visit Summary for   In-house Referral: Clinical Social Work   Post Acute Care Choice:  (TBD)                               Social Determinants of Health (SDOH) Interventions SDOH Screenings   Alcohol Screen: Low Risk  (04/12/2021)  Tobacco Use: Low Risk  (06/30/2022)     Readmission Risk Interventions    03/18/2022   11:45 AM 03/03/2022   12:04 PM 02/07/2022   11:58 AM  Readmission Risk Prevention Plan  Transportation Screening Complete Complete Complete  Medication Review [employer]) Complete Complete   PCP or Specialist appointment within 3-5 days of discharge Complete Complete   HRI or Home Care Consult Complete Complete   SW Recovery Care/Counseling Consult Complete Complete Complete  Palliative Care Screening Not Applicable Not Applicable   Skilled Nursing Facility Complete Complete Complete

## 2022-07-05 DIAGNOSIS — G9340 Encephalopathy, unspecified: Secondary | ICD-10-CM | POA: Diagnosis not present

## 2022-07-05 DIAGNOSIS — J440 Chronic obstructive pulmonary disease with acute lower respiratory infection: Secondary | ICD-10-CM | POA: Diagnosis not present

## 2022-07-05 DIAGNOSIS — Z930 Tracheostomy status: Secondary | ICD-10-CM | POA: Diagnosis not present

## 2022-07-05 DIAGNOSIS — J9621 Acute and chronic respiratory failure with hypoxia: Secondary | ICD-10-CM | POA: Diagnosis not present

## 2022-07-05 DIAGNOSIS — I4891 Unspecified atrial fibrillation: Secondary | ICD-10-CM | POA: Diagnosis not present

## 2022-07-05 DIAGNOSIS — A419 Sepsis, unspecified organism: Secondary | ICD-10-CM | POA: Diagnosis not present

## 2022-07-05 DIAGNOSIS — J9620 Acute and chronic respiratory failure, unspecified whether with hypoxia or hypercapnia: Secondary | ICD-10-CM | POA: Diagnosis not present

## 2022-07-05 DIAGNOSIS — R000 Tachycardia, unspecified: Secondary | ICD-10-CM | POA: Diagnosis not present

## 2022-07-06 DIAGNOSIS — G9340 Encephalopathy, unspecified: Secondary | ICD-10-CM | POA: Diagnosis not present

## 2022-07-06 DIAGNOSIS — J9621 Acute and chronic respiratory failure with hypoxia: Secondary | ICD-10-CM | POA: Diagnosis not present

## 2022-07-06 DIAGNOSIS — A419 Sepsis, unspecified organism: Secondary | ICD-10-CM | POA: Diagnosis not present

## 2022-07-06 DIAGNOSIS — J9620 Acute and chronic respiratory failure, unspecified whether with hypoxia or hypercapnia: Secondary | ICD-10-CM | POA: Diagnosis not present

## 2022-07-06 DIAGNOSIS — Z930 Tracheostomy status: Secondary | ICD-10-CM | POA: Diagnosis not present

## 2022-07-06 DIAGNOSIS — J440 Chronic obstructive pulmonary disease with acute lower respiratory infection: Secondary | ICD-10-CM | POA: Diagnosis not present

## 2022-07-07 DIAGNOSIS — J9621 Acute and chronic respiratory failure with hypoxia: Secondary | ICD-10-CM | POA: Diagnosis not present

## 2022-07-07 DIAGNOSIS — Z930 Tracheostomy status: Secondary | ICD-10-CM | POA: Diagnosis not present

## 2022-07-08 DIAGNOSIS — Z930 Tracheostomy status: Secondary | ICD-10-CM | POA: Diagnosis not present

## 2022-07-08 DIAGNOSIS — J9621 Acute and chronic respiratory failure with hypoxia: Secondary | ICD-10-CM | POA: Diagnosis not present

## 2022-07-09 DIAGNOSIS — Z930 Tracheostomy status: Secondary | ICD-10-CM | POA: Diagnosis not present

## 2022-07-09 DIAGNOSIS — J9621 Acute and chronic respiratory failure with hypoxia: Secondary | ICD-10-CM | POA: Diagnosis not present

## 2022-07-10 DIAGNOSIS — R251 Tremor, unspecified: Secondary | ICD-10-CM | POA: Diagnosis not present

## 2022-07-10 DIAGNOSIS — R2689 Other abnormalities of gait and mobility: Secondary | ICD-10-CM | POA: Diagnosis not present

## 2022-07-10 DIAGNOSIS — Z9911 Dependence on respirator [ventilator] status: Secondary | ICD-10-CM | POA: Diagnosis not present

## 2022-07-10 DIAGNOSIS — R4189 Other symptoms and signs involving cognitive functions and awareness: Secondary | ICD-10-CM | POA: Diagnosis not present

## 2022-07-10 DIAGNOSIS — J9621 Acute and chronic respiratory failure with hypoxia: Secondary | ICD-10-CM | POA: Diagnosis not present

## 2022-07-10 DIAGNOSIS — R5381 Other malaise: Secondary | ICD-10-CM | POA: Diagnosis not present

## 2022-07-11 DIAGNOSIS — Z9911 Dependence on respirator [ventilator] status: Secondary | ICD-10-CM | POA: Diagnosis not present

## 2022-07-11 DIAGNOSIS — J9621 Acute and chronic respiratory failure with hypoxia: Secondary | ICD-10-CM | POA: Diagnosis not present

## 2022-07-12 DIAGNOSIS — J9621 Acute and chronic respiratory failure with hypoxia: Secondary | ICD-10-CM | POA: Diagnosis not present

## 2022-07-12 DIAGNOSIS — Z9911 Dependence on respirator [ventilator] status: Secondary | ICD-10-CM | POA: Diagnosis not present

## 2022-07-13 DIAGNOSIS — J9621 Acute and chronic respiratory failure with hypoxia: Secondary | ICD-10-CM | POA: Diagnosis not present

## 2022-07-13 DIAGNOSIS — Z9911 Dependence on respirator [ventilator] status: Secondary | ICD-10-CM | POA: Diagnosis not present

## 2022-07-13 LAB — ECHOCARDIOGRAM COMPLETE
Height: 70 in
S' Lateral: 3.3 cm

## 2022-07-14 DIAGNOSIS — Z9911 Dependence on respirator [ventilator] status: Secondary | ICD-10-CM | POA: Diagnosis not present

## 2022-07-14 DIAGNOSIS — R4189 Other symptoms and signs involving cognitive functions and awareness: Secondary | ICD-10-CM | POA: Diagnosis not present

## 2022-07-14 DIAGNOSIS — J9621 Acute and chronic respiratory failure with hypoxia: Secondary | ICD-10-CM | POA: Diagnosis not present

## 2022-07-14 DIAGNOSIS — R5381 Other malaise: Secondary | ICD-10-CM | POA: Diagnosis not present

## 2022-07-14 DIAGNOSIS — R2689 Other abnormalities of gait and mobility: Secondary | ICD-10-CM | POA: Diagnosis not present

## 2022-07-14 DIAGNOSIS — R251 Tremor, unspecified: Secondary | ICD-10-CM | POA: Diagnosis not present

## 2022-07-15 DIAGNOSIS — Z9911 Dependence on respirator [ventilator] status: Secondary | ICD-10-CM | POA: Diagnosis not present

## 2022-07-15 DIAGNOSIS — J9621 Acute and chronic respiratory failure with hypoxia: Secondary | ICD-10-CM | POA: Diagnosis not present

## 2022-07-16 DIAGNOSIS — R2689 Other abnormalities of gait and mobility: Secondary | ICD-10-CM | POA: Diagnosis not present

## 2022-07-16 DIAGNOSIS — R5381 Other malaise: Secondary | ICD-10-CM | POA: Diagnosis not present

## 2022-07-16 DIAGNOSIS — R4189 Other symptoms and signs involving cognitive functions and awareness: Secondary | ICD-10-CM | POA: Diagnosis not present

## 2022-07-16 DIAGNOSIS — Z9911 Dependence on respirator [ventilator] status: Secondary | ICD-10-CM | POA: Diagnosis not present

## 2022-07-16 DIAGNOSIS — J9621 Acute and chronic respiratory failure with hypoxia: Secondary | ICD-10-CM | POA: Diagnosis not present

## 2022-07-16 DIAGNOSIS — R251 Tremor, unspecified: Secondary | ICD-10-CM | POA: Diagnosis not present

## 2022-07-17 DIAGNOSIS — J9621 Acute and chronic respiratory failure with hypoxia: Secondary | ICD-10-CM | POA: Diagnosis not present

## 2022-07-17 DIAGNOSIS — Z930 Tracheostomy status: Secondary | ICD-10-CM | POA: Diagnosis not present

## 2022-07-18 DIAGNOSIS — J9621 Acute and chronic respiratory failure with hypoxia: Secondary | ICD-10-CM | POA: Diagnosis not present

## 2022-07-18 DIAGNOSIS — R5381 Other malaise: Secondary | ICD-10-CM | POA: Diagnosis not present

## 2022-07-18 DIAGNOSIS — Z930 Tracheostomy status: Secondary | ICD-10-CM | POA: Diagnosis not present

## 2022-07-18 DIAGNOSIS — R251 Tremor, unspecified: Secondary | ICD-10-CM | POA: Diagnosis not present

## 2022-07-18 DIAGNOSIS — R2689 Other abnormalities of gait and mobility: Secondary | ICD-10-CM | POA: Diagnosis not present

## 2022-07-18 DIAGNOSIS — R4189 Other symptoms and signs involving cognitive functions and awareness: Secondary | ICD-10-CM | POA: Diagnosis not present

## 2022-07-19 DIAGNOSIS — Z930 Tracheostomy status: Secondary | ICD-10-CM | POA: Diagnosis not present

## 2022-07-19 DIAGNOSIS — J9621 Acute and chronic respiratory failure with hypoxia: Secondary | ICD-10-CM | POA: Diagnosis not present

## 2022-07-20 DIAGNOSIS — Z930 Tracheostomy status: Secondary | ICD-10-CM | POA: Diagnosis not present

## 2022-07-20 DIAGNOSIS — J9621 Acute and chronic respiratory failure with hypoxia: Secondary | ICD-10-CM | POA: Diagnosis not present

## 2022-07-21 DIAGNOSIS — J9621 Acute and chronic respiratory failure with hypoxia: Secondary | ICD-10-CM | POA: Diagnosis not present

## 2022-07-21 DIAGNOSIS — Z930 Tracheostomy status: Secondary | ICD-10-CM | POA: Diagnosis not present

## 2022-07-22 DIAGNOSIS — R2689 Other abnormalities of gait and mobility: Secondary | ICD-10-CM | POA: Diagnosis not present

## 2022-07-22 DIAGNOSIS — R5381 Other malaise: Secondary | ICD-10-CM | POA: Diagnosis not present

## 2022-07-22 DIAGNOSIS — R251 Tremor, unspecified: Secondary | ICD-10-CM | POA: Diagnosis not present

## 2022-07-22 DIAGNOSIS — Z930 Tracheostomy status: Secondary | ICD-10-CM | POA: Diagnosis not present

## 2022-07-22 DIAGNOSIS — J9621 Acute and chronic respiratory failure with hypoxia: Secondary | ICD-10-CM | POA: Diagnosis not present

## 2022-07-22 DIAGNOSIS — R4189 Other symptoms and signs involving cognitive functions and awareness: Secondary | ICD-10-CM | POA: Diagnosis not present

## 2022-07-23 DIAGNOSIS — Z930 Tracheostomy status: Secondary | ICD-10-CM | POA: Diagnosis not present

## 2022-07-23 DIAGNOSIS — J9621 Acute and chronic respiratory failure with hypoxia: Secondary | ICD-10-CM | POA: Diagnosis not present

## 2022-07-24 DIAGNOSIS — R5381 Other malaise: Secondary | ICD-10-CM | POA: Diagnosis not present

## 2022-07-24 DIAGNOSIS — J9621 Acute and chronic respiratory failure with hypoxia: Secondary | ICD-10-CM | POA: Diagnosis not present

## 2022-07-24 DIAGNOSIS — R2689 Other abnormalities of gait and mobility: Secondary | ICD-10-CM | POA: Diagnosis not present

## 2022-07-24 DIAGNOSIS — R251 Tremor, unspecified: Secondary | ICD-10-CM | POA: Diagnosis not present

## 2022-07-24 DIAGNOSIS — Z9911 Dependence on respirator [ventilator] status: Secondary | ICD-10-CM | POA: Diagnosis not present

## 2022-07-24 DIAGNOSIS — R4189 Other symptoms and signs involving cognitive functions and awareness: Secondary | ICD-10-CM | POA: Diagnosis not present

## 2022-07-25 DIAGNOSIS — J9621 Acute and chronic respiratory failure with hypoxia: Secondary | ICD-10-CM | POA: Diagnosis not present

## 2022-07-25 DIAGNOSIS — Z9911 Dependence on respirator [ventilator] status: Secondary | ICD-10-CM | POA: Diagnosis not present

## 2022-07-26 DIAGNOSIS — G9340 Encephalopathy, unspecified: Secondary | ICD-10-CM | POA: Diagnosis not present

## 2022-07-26 DIAGNOSIS — J440 Chronic obstructive pulmonary disease with acute lower respiratory infection: Secondary | ICD-10-CM | POA: Diagnosis not present

## 2022-07-26 DIAGNOSIS — A419 Sepsis, unspecified organism: Secondary | ICD-10-CM | POA: Diagnosis not present

## 2022-07-26 DIAGNOSIS — J9621 Acute and chronic respiratory failure with hypoxia: Secondary | ICD-10-CM | POA: Diagnosis not present

## 2022-07-26 DIAGNOSIS — Z9911 Dependence on respirator [ventilator] status: Secondary | ICD-10-CM | POA: Diagnosis not present

## 2022-07-26 DIAGNOSIS — J9620 Acute and chronic respiratory failure, unspecified whether with hypoxia or hypercapnia: Secondary | ICD-10-CM | POA: Diagnosis not present

## 2022-07-27 DIAGNOSIS — G9340 Encephalopathy, unspecified: Secondary | ICD-10-CM | POA: Diagnosis not present

## 2022-07-27 DIAGNOSIS — J440 Chronic obstructive pulmonary disease with acute lower respiratory infection: Secondary | ICD-10-CM | POA: Diagnosis not present

## 2022-07-27 DIAGNOSIS — J9621 Acute and chronic respiratory failure with hypoxia: Secondary | ICD-10-CM | POA: Diagnosis not present

## 2022-07-27 DIAGNOSIS — J9620 Acute and chronic respiratory failure, unspecified whether with hypoxia or hypercapnia: Secondary | ICD-10-CM | POA: Diagnosis not present

## 2022-07-27 DIAGNOSIS — Z9911 Dependence on respirator [ventilator] status: Secondary | ICD-10-CM | POA: Diagnosis not present

## 2022-07-27 DIAGNOSIS — A419 Sepsis, unspecified organism: Secondary | ICD-10-CM | POA: Diagnosis not present

## 2022-07-28 DIAGNOSIS — J9621 Acute and chronic respiratory failure with hypoxia: Secondary | ICD-10-CM | POA: Diagnosis not present

## 2022-07-28 DIAGNOSIS — Z9911 Dependence on respirator [ventilator] status: Secondary | ICD-10-CM | POA: Diagnosis not present

## 2022-07-29 DIAGNOSIS — J9621 Acute and chronic respiratory failure with hypoxia: Secondary | ICD-10-CM | POA: Diagnosis not present

## 2022-07-29 DIAGNOSIS — Z9911 Dependence on respirator [ventilator] status: Secondary | ICD-10-CM | POA: Diagnosis not present

## 2022-07-30 DIAGNOSIS — Z9911 Dependence on respirator [ventilator] status: Secondary | ICD-10-CM | POA: Diagnosis not present

## 2022-07-30 DIAGNOSIS — J9621 Acute and chronic respiratory failure with hypoxia: Secondary | ICD-10-CM | POA: Diagnosis not present

## 2022-07-31 DIAGNOSIS — Z930 Tracheostomy status: Secondary | ICD-10-CM | POA: Diagnosis not present

## 2022-07-31 DIAGNOSIS — J9621 Acute and chronic respiratory failure with hypoxia: Secondary | ICD-10-CM | POA: Diagnosis not present

## 2022-08-01 DIAGNOSIS — I480 Paroxysmal atrial fibrillation: Secondary | ICD-10-CM | POA: Diagnosis not present

## 2022-08-01 DIAGNOSIS — J9620 Acute and chronic respiratory failure, unspecified whether with hypoxia or hypercapnia: Secondary | ICD-10-CM | POA: Diagnosis not present

## 2022-08-01 DIAGNOSIS — I9589 Other hypotension: Secondary | ICD-10-CM | POA: Diagnosis not present

## 2022-08-01 DIAGNOSIS — Z430 Encounter for attention to tracheostomy: Secondary | ICD-10-CM | POA: Diagnosis not present

## 2022-08-01 DIAGNOSIS — Z9911 Dependence on respirator [ventilator] status: Secondary | ICD-10-CM | POA: Diagnosis not present

## 2022-08-01 DIAGNOSIS — I10 Essential (primary) hypertension: Secondary | ICD-10-CM | POA: Diagnosis not present

## 2022-08-01 DIAGNOSIS — G4089 Other seizures: Secondary | ICD-10-CM | POA: Diagnosis not present

## 2022-08-01 DIAGNOSIS — Z431 Encounter for attention to gastrostomy: Secondary | ICD-10-CM | POA: Diagnosis not present

## 2022-08-01 DIAGNOSIS — G250 Essential tremor: Secondary | ICD-10-CM | POA: Diagnosis not present

## 2022-08-01 DIAGNOSIS — R1319 Other dysphagia: Secondary | ICD-10-CM | POA: Diagnosis not present

## 2022-08-01 DIAGNOSIS — I5023 Acute on chronic systolic (congestive) heart failure: Secondary | ICD-10-CM | POA: Diagnosis not present

## 2022-08-01 DIAGNOSIS — G20A2 Parkinson's disease without dyskinesia, with fluctuations: Secondary | ICD-10-CM | POA: Diagnosis not present

## 2022-08-04 DIAGNOSIS — I480 Paroxysmal atrial fibrillation: Secondary | ICD-10-CM | POA: Diagnosis not present

## 2022-08-04 DIAGNOSIS — Z430 Encounter for attention to tracheostomy: Secondary | ICD-10-CM | POA: Diagnosis not present

## 2022-08-04 DIAGNOSIS — Z431 Encounter for attention to gastrostomy: Secondary | ICD-10-CM | POA: Diagnosis not present

## 2022-08-04 DIAGNOSIS — I5023 Acute on chronic systolic (congestive) heart failure: Secondary | ICD-10-CM | POA: Diagnosis not present

## 2022-08-04 DIAGNOSIS — J9620 Acute and chronic respiratory failure, unspecified whether with hypoxia or hypercapnia: Secondary | ICD-10-CM | POA: Diagnosis not present

## 2022-08-04 DIAGNOSIS — G250 Essential tremor: Secondary | ICD-10-CM | POA: Diagnosis not present

## 2022-08-05 DIAGNOSIS — I480 Paroxysmal atrial fibrillation: Secondary | ICD-10-CM | POA: Diagnosis not present

## 2022-08-05 DIAGNOSIS — G250 Essential tremor: Secondary | ICD-10-CM | POA: Diagnosis not present

## 2022-08-05 DIAGNOSIS — Z431 Encounter for attention to gastrostomy: Secondary | ICD-10-CM | POA: Diagnosis not present

## 2022-08-05 DIAGNOSIS — Z430 Encounter for attention to tracheostomy: Secondary | ICD-10-CM | POA: Diagnosis not present

## 2022-08-05 DIAGNOSIS — I5023 Acute on chronic systolic (congestive) heart failure: Secondary | ICD-10-CM | POA: Diagnosis not present

## 2022-08-05 DIAGNOSIS — J9620 Acute and chronic respiratory failure, unspecified whether with hypoxia or hypercapnia: Secondary | ICD-10-CM | POA: Diagnosis not present

## 2022-08-07 DIAGNOSIS — J9621 Acute and chronic respiratory failure with hypoxia: Secondary | ICD-10-CM | POA: Diagnosis not present

## 2022-08-10 DIAGNOSIS — Z4659 Encounter for fitting and adjustment of other gastrointestinal appliance and device: Secondary | ICD-10-CM | POA: Diagnosis not present

## 2022-08-10 DIAGNOSIS — R109 Unspecified abdominal pain: Secondary | ICD-10-CM | POA: Diagnosis not present

## 2022-08-10 DIAGNOSIS — J9621 Acute and chronic respiratory failure with hypoxia: Secondary | ICD-10-CM | POA: Diagnosis not present

## 2022-08-12 DIAGNOSIS — I480 Paroxysmal atrial fibrillation: Secondary | ICD-10-CM | POA: Diagnosis not present

## 2022-08-12 DIAGNOSIS — J9620 Acute and chronic respiratory failure, unspecified whether with hypoxia or hypercapnia: Secondary | ICD-10-CM | POA: Diagnosis not present

## 2022-08-12 DIAGNOSIS — G250 Essential tremor: Secondary | ICD-10-CM | POA: Diagnosis not present

## 2022-08-12 DIAGNOSIS — Z430 Encounter for attention to tracheostomy: Secondary | ICD-10-CM | POA: Diagnosis not present

## 2022-08-12 DIAGNOSIS — Z431 Encounter for attention to gastrostomy: Secondary | ICD-10-CM | POA: Diagnosis not present

## 2022-08-12 DIAGNOSIS — I5023 Acute on chronic systolic (congestive) heart failure: Secondary | ICD-10-CM | POA: Diagnosis not present

## 2022-08-18 DIAGNOSIS — I480 Paroxysmal atrial fibrillation: Secondary | ICD-10-CM | POA: Diagnosis not present

## 2022-08-18 DIAGNOSIS — Z430 Encounter for attention to tracheostomy: Secondary | ICD-10-CM | POA: Diagnosis not present

## 2022-08-18 DIAGNOSIS — G250 Essential tremor: Secondary | ICD-10-CM | POA: Diagnosis not present

## 2022-08-18 DIAGNOSIS — J9620 Acute and chronic respiratory failure, unspecified whether with hypoxia or hypercapnia: Secondary | ICD-10-CM | POA: Diagnosis not present

## 2022-08-18 DIAGNOSIS — Z431 Encounter for attention to gastrostomy: Secondary | ICD-10-CM | POA: Diagnosis not present

## 2022-08-18 DIAGNOSIS — I5023 Acute on chronic systolic (congestive) heart failure: Secondary | ICD-10-CM | POA: Diagnosis not present

## 2022-08-19 DIAGNOSIS — Z431 Encounter for attention to gastrostomy: Secondary | ICD-10-CM | POA: Diagnosis not present

## 2022-08-19 DIAGNOSIS — J9620 Acute and chronic respiratory failure, unspecified whether with hypoxia or hypercapnia: Secondary | ICD-10-CM | POA: Diagnosis not present

## 2022-08-19 DIAGNOSIS — G250 Essential tremor: Secondary | ICD-10-CM | POA: Diagnosis not present

## 2022-08-19 DIAGNOSIS — I480 Paroxysmal atrial fibrillation: Secondary | ICD-10-CM | POA: Diagnosis not present

## 2022-08-19 DIAGNOSIS — I5023 Acute on chronic systolic (congestive) heart failure: Secondary | ICD-10-CM | POA: Diagnosis not present

## 2022-08-19 DIAGNOSIS — Z430 Encounter for attention to tracheostomy: Secondary | ICD-10-CM | POA: Diagnosis not present

## 2022-08-20 DIAGNOSIS — G9341 Metabolic encephalopathy: Secondary | ICD-10-CM | POA: Diagnosis not present

## 2022-08-20 DIAGNOSIS — J9601 Acute respiratory failure with hypoxia: Secondary | ICD-10-CM | POA: Diagnosis not present

## 2022-08-20 DIAGNOSIS — J449 Chronic obstructive pulmonary disease, unspecified: Secondary | ICD-10-CM | POA: Diagnosis not present

## 2022-08-20 DIAGNOSIS — I480 Paroxysmal atrial fibrillation: Secondary | ICD-10-CM | POA: Diagnosis not present

## 2022-08-21 DIAGNOSIS — J9621 Acute and chronic respiratory failure with hypoxia: Secondary | ICD-10-CM | POA: Diagnosis not present

## 2022-08-24 DIAGNOSIS — Z9911 Dependence on respirator [ventilator] status: Secondary | ICD-10-CM | POA: Diagnosis not present

## 2022-08-24 DIAGNOSIS — G4089 Other seizures: Secondary | ICD-10-CM | POA: Diagnosis not present

## 2022-08-24 DIAGNOSIS — J9621 Acute and chronic respiratory failure with hypoxia: Secondary | ICD-10-CM | POA: Diagnosis not present

## 2022-08-24 DIAGNOSIS — R1319 Other dysphagia: Secondary | ICD-10-CM | POA: Diagnosis not present

## 2022-08-24 DIAGNOSIS — G20A2 Parkinson's disease without dyskinesia, with fluctuations: Secondary | ICD-10-CM | POA: Diagnosis not present

## 2022-08-24 DIAGNOSIS — I480 Paroxysmal atrial fibrillation: Secondary | ICD-10-CM | POA: Diagnosis not present

## 2022-08-24 DIAGNOSIS — I5023 Acute on chronic systolic (congestive) heart failure: Secondary | ICD-10-CM | POA: Diagnosis not present

## 2022-08-24 DIAGNOSIS — Z430 Encounter for attention to tracheostomy: Secondary | ICD-10-CM | POA: Diagnosis not present

## 2022-08-24 DIAGNOSIS — I9589 Other hypotension: Secondary | ICD-10-CM | POA: Diagnosis not present

## 2022-08-24 DIAGNOSIS — I10 Essential (primary) hypertension: Secondary | ICD-10-CM | POA: Diagnosis not present

## 2022-08-24 DIAGNOSIS — G250 Essential tremor: Secondary | ICD-10-CM | POA: Diagnosis not present

## 2022-08-24 DIAGNOSIS — Z431 Encounter for attention to gastrostomy: Secondary | ICD-10-CM | POA: Diagnosis not present

## 2022-08-24 DIAGNOSIS — J9620 Acute and chronic respiratory failure, unspecified whether with hypoxia or hypercapnia: Secondary | ICD-10-CM | POA: Diagnosis not present

## 2022-08-26 DIAGNOSIS — Z431 Encounter for attention to gastrostomy: Secondary | ICD-10-CM | POA: Diagnosis not present

## 2022-08-26 DIAGNOSIS — Z430 Encounter for attention to tracheostomy: Secondary | ICD-10-CM | POA: Diagnosis not present

## 2022-08-26 DIAGNOSIS — I5023 Acute on chronic systolic (congestive) heart failure: Secondary | ICD-10-CM | POA: Diagnosis not present

## 2022-08-26 DIAGNOSIS — J9620 Acute and chronic respiratory failure, unspecified whether with hypoxia or hypercapnia: Secondary | ICD-10-CM | POA: Diagnosis not present

## 2022-08-26 DIAGNOSIS — R0989 Other specified symptoms and signs involving the circulatory and respiratory systems: Secondary | ICD-10-CM | POA: Diagnosis not present

## 2022-08-26 DIAGNOSIS — G250 Essential tremor: Secondary | ICD-10-CM | POA: Diagnosis not present

## 2022-08-26 DIAGNOSIS — I480 Paroxysmal atrial fibrillation: Secondary | ICD-10-CM | POA: Diagnosis not present

## 2022-08-27 DIAGNOSIS — J9621 Acute and chronic respiratory failure with hypoxia: Secondary | ICD-10-CM | POA: Diagnosis not present

## 2022-08-31 DIAGNOSIS — I480 Paroxysmal atrial fibrillation: Secondary | ICD-10-CM | POA: Diagnosis not present

## 2022-08-31 DIAGNOSIS — I5023 Acute on chronic systolic (congestive) heart failure: Secondary | ICD-10-CM | POA: Diagnosis not present

## 2022-08-31 DIAGNOSIS — Z430 Encounter for attention to tracheostomy: Secondary | ICD-10-CM | POA: Diagnosis not present

## 2022-08-31 DIAGNOSIS — G250 Essential tremor: Secondary | ICD-10-CM | POA: Diagnosis not present

## 2022-08-31 DIAGNOSIS — J9620 Acute and chronic respiratory failure, unspecified whether with hypoxia or hypercapnia: Secondary | ICD-10-CM | POA: Diagnosis not present

## 2022-08-31 DIAGNOSIS — Z431 Encounter for attention to gastrostomy: Secondary | ICD-10-CM | POA: Diagnosis not present

## 2022-09-01 DIAGNOSIS — J9621 Acute and chronic respiratory failure with hypoxia: Secondary | ICD-10-CM | POA: Diagnosis not present

## 2022-09-02 DIAGNOSIS — Z4659 Encounter for fitting and adjustment of other gastrointestinal appliance and device: Secondary | ICD-10-CM | POA: Diagnosis not present

## 2022-09-02 DIAGNOSIS — Z430 Encounter for attention to tracheostomy: Secondary | ICD-10-CM | POA: Diagnosis not present

## 2022-09-02 DIAGNOSIS — I480 Paroxysmal atrial fibrillation: Secondary | ICD-10-CM | POA: Diagnosis not present

## 2022-09-02 DIAGNOSIS — J9620 Acute and chronic respiratory failure, unspecified whether with hypoxia or hypercapnia: Secondary | ICD-10-CM | POA: Diagnosis not present

## 2022-09-02 DIAGNOSIS — G250 Essential tremor: Secondary | ICD-10-CM | POA: Diagnosis not present

## 2022-09-02 DIAGNOSIS — R109 Unspecified abdominal pain: Secondary | ICD-10-CM | POA: Diagnosis not present

## 2022-09-02 DIAGNOSIS — I5023 Acute on chronic systolic (congestive) heart failure: Secondary | ICD-10-CM | POA: Diagnosis not present

## 2022-09-02 DIAGNOSIS — Z431 Encounter for attention to gastrostomy: Secondary | ICD-10-CM | POA: Diagnosis not present

## 2022-09-03 DIAGNOSIS — I4892 Unspecified atrial flutter: Secondary | ICD-10-CM | POA: Diagnosis not present

## 2022-09-04 DIAGNOSIS — I480 Paroxysmal atrial fibrillation: Secondary | ICD-10-CM | POA: Diagnosis not present

## 2022-09-04 DIAGNOSIS — Z431 Encounter for attention to gastrostomy: Secondary | ICD-10-CM | POA: Diagnosis not present

## 2022-09-04 DIAGNOSIS — I5023 Acute on chronic systolic (congestive) heart failure: Secondary | ICD-10-CM | POA: Diagnosis not present

## 2022-09-04 DIAGNOSIS — G250 Essential tremor: Secondary | ICD-10-CM | POA: Diagnosis not present

## 2022-09-04 DIAGNOSIS — Z430 Encounter for attention to tracheostomy: Secondary | ICD-10-CM | POA: Diagnosis not present

## 2022-09-04 DIAGNOSIS — R0989 Other specified symptoms and signs involving the circulatory and respiratory systems: Secondary | ICD-10-CM | POA: Diagnosis not present

## 2022-09-04 DIAGNOSIS — J9620 Acute and chronic respiratory failure, unspecified whether with hypoxia or hypercapnia: Secondary | ICD-10-CM | POA: Diagnosis not present

## 2022-09-05 DIAGNOSIS — J9621 Acute and chronic respiratory failure with hypoxia: Secondary | ICD-10-CM | POA: Diagnosis not present

## 2022-09-08 DIAGNOSIS — Z430 Encounter for attention to tracheostomy: Secondary | ICD-10-CM | POA: Diagnosis not present

## 2022-09-08 DIAGNOSIS — I5023 Acute on chronic systolic (congestive) heart failure: Secondary | ICD-10-CM | POA: Diagnosis not present

## 2022-09-08 DIAGNOSIS — Z431 Encounter for attention to gastrostomy: Secondary | ICD-10-CM | POA: Diagnosis not present

## 2022-09-08 DIAGNOSIS — J9620 Acute and chronic respiratory failure, unspecified whether with hypoxia or hypercapnia: Secondary | ICD-10-CM | POA: Diagnosis not present

## 2022-09-08 DIAGNOSIS — I480 Paroxysmal atrial fibrillation: Secondary | ICD-10-CM | POA: Diagnosis not present

## 2022-09-08 DIAGNOSIS — G250 Essential tremor: Secondary | ICD-10-CM | POA: Diagnosis not present

## 2022-09-09 ENCOUNTER — Observation Stay (HOSPITAL_COMMUNITY)
Admission: EM | Admit: 2022-09-09 | Discharge: 2022-09-10 | Disposition: A | Discharge status: Home / Self Care | Payer: Medicare Other | Attending: Infectious Diseases | Admitting: Infectious Diseases

## 2022-09-09 ENCOUNTER — Emergency Department (HOSPITAL_COMMUNITY): Payer: Medicare Other

## 2022-09-09 ENCOUNTER — Other Ambulatory Visit: Payer: Self-pay

## 2022-09-09 DIAGNOSIS — J9611 Chronic respiratory failure with hypoxia: Secondary | ICD-10-CM | POA: Diagnosis not present

## 2022-09-09 DIAGNOSIS — Z79890 Hormone replacement therapy (postmenopausal): Secondary | ICD-10-CM | POA: Insufficient documentation

## 2022-09-09 DIAGNOSIS — K219 Gastro-esophageal reflux disease without esophagitis: Secondary | ICD-10-CM | POA: Diagnosis not present

## 2022-09-09 DIAGNOSIS — K9423 Gastrostomy malfunction: Secondary | ICD-10-CM | POA: Diagnosis not present

## 2022-09-09 DIAGNOSIS — R7989 Other specified abnormal findings of blood chemistry: Secondary | ICD-10-CM | POA: Diagnosis present

## 2022-09-09 DIAGNOSIS — T85528A Displacement of other gastrointestinal prosthetic devices, implants and grafts, initial encounter: Secondary | ICD-10-CM | POA: Insufficient documentation

## 2022-09-09 DIAGNOSIS — I480 Paroxysmal atrial fibrillation: Secondary | ICD-10-CM | POA: Insufficient documentation

## 2022-09-09 DIAGNOSIS — I5023 Acute on chronic systolic (congestive) heart failure: Secondary | ICD-10-CM | POA: Diagnosis not present

## 2022-09-09 DIAGNOSIS — K5900 Constipation, unspecified: Secondary | ICD-10-CM | POA: Insufficient documentation

## 2022-09-09 DIAGNOSIS — I5022 Chronic systolic (congestive) heart failure: Secondary | ICD-10-CM | POA: Diagnosis not present

## 2022-09-09 DIAGNOSIS — E871 Hypo-osmolality and hyponatremia: Secondary | ICD-10-CM | POA: Diagnosis not present

## 2022-09-09 DIAGNOSIS — Z79899 Other long term (current) drug therapy: Secondary | ICD-10-CM | POA: Insufficient documentation

## 2022-09-09 DIAGNOSIS — Y732 Prosthetic and other implants, materials and accessory gastroenterology and urology devices associated with adverse incidents: Secondary | ICD-10-CM | POA: Insufficient documentation

## 2022-09-09 DIAGNOSIS — I110 Hypertensive heart disease with heart failure: Secondary | ICD-10-CM | POA: Insufficient documentation

## 2022-09-09 DIAGNOSIS — E876 Hypokalemia: Secondary | ICD-10-CM | POA: Diagnosis not present

## 2022-09-09 DIAGNOSIS — K9429 Other complications of gastrostomy: Secondary | ICD-10-CM | POA: Diagnosis not present

## 2022-09-09 DIAGNOSIS — J9620 Acute and chronic respiratory failure, unspecified whether with hypoxia or hypercapnia: Secondary | ICD-10-CM | POA: Diagnosis not present

## 2022-09-09 DIAGNOSIS — E039 Hypothyroidism, unspecified: Secondary | ICD-10-CM | POA: Diagnosis not present

## 2022-09-09 DIAGNOSIS — F32A Depression, unspecified: Secondary | ICD-10-CM | POA: Diagnosis not present

## 2022-09-09 DIAGNOSIS — J9612 Chronic respiratory failure with hypercapnia: Secondary | ICD-10-CM | POA: Insufficient documentation

## 2022-09-09 DIAGNOSIS — Z431 Encounter for attention to gastrostomy: Secondary | ICD-10-CM | POA: Diagnosis not present

## 2022-09-09 DIAGNOSIS — J4489 Other specified chronic obstructive pulmonary disease: Secondary | ICD-10-CM | POA: Diagnosis not present

## 2022-09-09 DIAGNOSIS — Z9911 Dependence on respirator [ventilator] status: Secondary | ICD-10-CM | POA: Insufficient documentation

## 2022-09-09 DIAGNOSIS — I2510 Atherosclerotic heart disease of native coronary artery without angina pectoris: Secondary | ICD-10-CM | POA: Diagnosis not present

## 2022-09-09 DIAGNOSIS — Z430 Encounter for attention to tracheostomy: Secondary | ICD-10-CM | POA: Diagnosis not present

## 2022-09-09 DIAGNOSIS — G250 Essential tremor: Secondary | ICD-10-CM | POA: Diagnosis not present

## 2022-09-09 HISTORY — PX: IR REPLACE G-TUBE SIMPLE WO FLUORO: IMG2323

## 2022-09-09 HISTORY — DX: Hypothyroidism, unspecified: E03.9

## 2022-09-09 HISTORY — DX: Gastro-esophageal reflux disease without esophagitis: K21.9

## 2022-09-09 LAB — CBC WITH DIFFERENTIAL/PLATELET
Abs Immature Granulocytes: 0.1 10*3/uL — ABNORMAL HIGH (ref 0.00–0.07)
Basophils Absolute: 0 10*3/uL (ref 0.0–0.1)
Basophils Relative: 0 %
Eosinophils Absolute: 0.1 10*3/uL (ref 0.0–0.5)
Eosinophils Relative: 1 %
HCT: 28.1 % — ABNORMAL LOW (ref 39.0–52.0)
Hemoglobin: 8.1 g/dL — ABNORMAL LOW (ref 13.0–17.0)
Immature Granulocytes: 1 %
Lymphocytes Relative: 2 %
Lymphs Abs: 0.3 10*3/uL — ABNORMAL LOW (ref 0.7–4.0)
MCH: 24.8 pg — ABNORMAL LOW (ref 26.0–34.0)
MCHC: 28.8 g/dL — ABNORMAL LOW (ref 30.0–36.0)
MCV: 86.2 fL (ref 80.0–100.0)
Monocytes Absolute: 0.7 10*3/uL (ref 0.1–1.0)
Monocytes Relative: 5 %
Neutro Abs: 12.1 10*3/uL — ABNORMAL HIGH (ref 1.7–7.7)
Neutrophils Relative %: 91 %
Platelets: 355 10*3/uL (ref 150–400)
RBC: 3.26 MIL/uL — ABNORMAL LOW (ref 4.22–5.81)
RDW: 19.9 % — ABNORMAL HIGH (ref 11.5–15.5)
WBC: 13.4 10*3/uL — ABNORMAL HIGH (ref 4.0–10.5)
nRBC: 0 % (ref 0.0–0.2)

## 2022-09-09 LAB — I-STAT VENOUS BLOOD GAS, ED
Acid-Base Excess: 28 mmol/L — ABNORMAL HIGH (ref 0.0–2.0)
Bicarbonate: 53.7 mmol/L — ABNORMAL HIGH (ref 20.0–28.0)
Calcium, Ion: 1.03 mmol/L — ABNORMAL LOW (ref 1.15–1.40)
HCT: 27 % — ABNORMAL LOW (ref 39.0–52.0)
Hemoglobin: 9.2 g/dL — ABNORMAL LOW (ref 13.0–17.0)
O2 Saturation: 99 %
Potassium: 3 mmol/L — ABNORMAL LOW (ref 3.5–5.1)
Sodium: 133 mmol/L — ABNORMAL LOW (ref 135–145)
TCO2: >50 mmol/L — ABNORMAL HIGH (ref 22–32)
pCO2, Ven: 58 mmHg (ref 44–60)
pH, Ven: 7.575 — ABNORMAL HIGH (ref 7.25–7.43)
pO2, Ven: 110 mmHg — ABNORMAL HIGH (ref 32–45)

## 2022-09-09 LAB — BASIC METABOLIC PANEL
BUN: <5 mg/dL — ABNORMAL LOW (ref 8–23)
CO2: >45 mmol/L — ABNORMAL HIGH (ref 22–32)
Calcium: 8.1 mg/dL — ABNORMAL LOW (ref 8.9–10.3)
Chloride: 78 mmol/L — ABNORMAL LOW (ref 98–111)
Creatinine, Ser: 0.43 mg/dL — ABNORMAL LOW (ref 0.61–1.24)
GFR, Estimated: >60 mL/min (ref >60)
Glucose, Bld: 112 mg/dL — ABNORMAL HIGH (ref 70–99)
Potassium: 2.4 mmol/L — CL (ref 3.5–5.1)
Sodium: 134 mmol/L — ABNORMAL LOW (ref 135–145)

## 2022-09-09 LAB — POTASSIUM: Potassium: 3.5 mmol/L (ref 3.5–5.1)

## 2022-09-09 LAB — MAGNESIUM: Magnesium: 1.5 mg/dL — ABNORMAL LOW (ref 1.7–2.4)

## 2022-09-09 MED ORDER — DIATRIZOATE MEGLUMINE & SODIUM 66-10 % PO SOLN
30.0000 mL | Freq: Once | ORAL | Status: AC
  Administered 2022-09-09: 30 mL

## 2022-09-09 MED ORDER — DIATRIZOATE MEGLUMINE & SODIUM 66-10 % PO SOLN
ORAL | Status: AC
  Filled 2022-09-09: qty 30

## 2022-09-09 MED ORDER — POTASSIUM CHLORIDE 20 MEQ PO PACK
40.0000 meq | PACK | Freq: Two times a day (BID) | ORAL | Status: DC
  Administered 2022-09-09 – 2022-09-10 (×2): 40 meq
  Filled 2022-09-09 (×2): qty 2

## 2022-09-09 MED ORDER — LACTATED RINGERS IV BOLUS: 500.0000 mL | Freq: Once | INTRAVENOUS | Status: DC

## 2022-09-09 MED ORDER — MAGNESIUM SULFATE 2 GM/50ML IV SOLN
2.0000 g | Freq: Once | INTRAVENOUS | Status: AC
  Administered 2022-09-09: 2 g via INTRAVENOUS
  Filled 2022-09-09: qty 50

## 2022-09-09 MED ORDER — FOLIC ACID 1 MG PO TABS
1.0000 mg | ORAL_TABLET | Freq: Every day | ORAL | Status: DC
  Administered 2022-09-10: 1 mg
  Filled 2022-09-09: qty 1

## 2022-09-09 MED ORDER — GABAPENTIN 100 MG PO CAPS: 200.0000 mg | ORAL_CAPSULE | Freq: Two times a day (BID) | ORAL | Status: DC

## 2022-09-09 MED ORDER — LIDOCAINE VISCOUS HCL 2 % MT SOLN
OROMUCOSAL | Status: AC
  Filled 2022-09-09: qty 15

## 2022-09-09 MED ORDER — GABAPENTIN 250 MG/5ML PO SOLN
200.0000 mg | Freq: Two times a day (BID) | ORAL | Status: DC
  Administered 2022-09-09 – 2022-09-10 (×2): 200 mg
  Filled 2022-09-09 (×3): qty 4

## 2022-09-09 MED ORDER — SERTRALINE HCL 100 MG PO TABS
100.0000 mg | ORAL_TABLET | Freq: Every day | ORAL | Status: DC
  Administered 2022-09-10: 100 mg
  Filled 2022-09-09: qty 1

## 2022-09-09 MED ORDER — NYSTATIN 100000 UNIT/GM EX CREA
TOPICAL_CREAM | Freq: Two times a day (BID) | CUTANEOUS | Status: DC
  Filled 2022-09-09 (×2): qty 30

## 2022-09-09 MED ORDER — POTASSIUM CHLORIDE 20 MEQ PO PACK
40.0000 meq | PACK | Freq: Once | ORAL | Status: DC
  Filled 2022-09-09: qty 2

## 2022-09-09 MED ORDER — LEVOTHYROXINE SODIUM 25 MCG PO TABS
25.0000 ug | ORAL_TABLET | Freq: Every day | ORAL | Status: DC
  Administered 2022-09-10: 25 ug
  Filled 2022-09-09: qty 1

## 2022-09-09 MED ORDER — AMLODIPINE BESYLATE 5 MG PO TABS
5.0000 mg | ORAL_TABLET | Freq: Every day | ORAL | Status: DC
  Administered 2022-09-10: 5 mg
  Filled 2022-09-09: qty 1

## 2022-09-09 MED ORDER — AMIODARONE HCL 200 MG PO TABS
200.0000 mg | ORAL_TABLET | Freq: Every day | ORAL | Status: DC
  Administered 2022-09-10: 200 mg
  Filled 2022-09-09: qty 1

## 2022-09-09 MED ORDER — LACTATED RINGERS IV BOLUS
1000.0000 mL | Freq: Once | INTRAVENOUS | Status: AC
  Administered 2022-09-09: 1000 mL via INTRAVENOUS

## 2022-09-09 MED ORDER — POTASSIUM CHLORIDE 10 MEQ/100ML IV SOLN
10.0000 meq | INTRAVENOUS | Status: AC
  Administered 2022-09-09 (×4): 10 meq via INTRAVENOUS
  Filled 2022-09-09 (×4): qty 100

## 2022-09-09 MED ORDER — POLYETHYLENE GLYCOL 3350 17 G PO PACK: 17.0000 g | PACK | Freq: Every day | ORAL | Status: DC | PRN

## 2022-09-09 MED ORDER — BUDESONIDE 0.5 MG/2ML IN SUSP
0.5000 mg | Freq: Two times a day (BID) | RESPIRATORY_TRACT | Status: DC
  Administered 2022-09-10: 0.5 mg via RESPIRATORY_TRACT
  Filled 2022-09-09: qty 2

## 2022-09-09 NOTE — Hospital Course (Signed)
    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 PEEP of 8, FiO2 40% respiratory rate of 26.     Hypokalemia  Hypomagnesemia Hyponatremia On presentation, K2.5, Mg 1.2, NA 134. Patient had an IR guided G-tube replacement done on 09/09/2022. Unclear the etiology of electrolyte derangements, likely multifactorial, from PEG tube dislodgment, to inadequate caloric intake. Electrolytes were repleted, and on the day of discharge electrolytes are within normal range.  Erythema around PEG tube  -Continue Keflex 500 mg twice daily for 5 days.   Chronic medical conditions   A-fib -Amiodarone 200 mg   Hypertension -Amlodipine 5 mg   Hypothyroidism - Synthroid 25 mcg tablet.   GERD -Lansoprazole 30 mg   Asthma Albuterol 1.25 mg/72M nebulizer   Depression - Sertraline 100 mg   Pain management -Tylenol   Constipation Miralax packet 17 g.

## 2022-09-09 NOTE — Consult Note (Deleted)
NAME:  Austin Davis, MRN:  [id number], DOB:  September 03, 1940, LOS: 0 ADMISSION DATE:  09/09/2022, CONSULTATION DATE:  09/09/2022 REFERRING MD:  Silverio Lay - EDP CHIEF COMPLAINT:  Vent management   History of Present Illness:  82 year old man who presented to [facility] from [facility] 8/20 for electrolyte derangements and PEG tube dislodgement. 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 Recent admission to [facility] 6/9 - 6/14 for sepsis/hypotension; Resp Cx grew pseudomonas aeruginosa and providencia stuartii (treated with meropenem).   Presented to [facility] ED from [facility] for hypokalemia and PEG tube issues. Per chart review, [facility] RN assessed patient's PEG 8/19 and it was noted to be dislodged; on 8/20 (day of admission) she noted blood/gastric fluid draining from PEG stoma. On ED arrival, normothermic with HR 97, BP 99/78, RR 21, SpO2 93%. Labs were notable for WBC 13.4, Hgb 8.1, Plt 355. Na 134, K 2.4, Ccl 78, CO2 >45, BUN < 5, Cr 0.43. Mg 1.5. IR was called for bedside replacement of PEG tube which was completed in ED (24Fr). Abdominal XR post-procedure confirmed correct placement.   PCCM consulted for vent management and assistance with hyperkalemia.  Pertinent  Medical History   Past Medical History:  Diagnosis Date   Aspiration pneumonia (HCC) 03/25/2021   CHF (congestive heart failure) (HCC)    Coronary artery disease    Essential tremor    GERD (gastroesophageal reflux disease)    GI bleed 2019   hospitalized at [facility] for one week   Headache    since childhood   Hypothyroidism    Low blood sugar    since childhood, controlled by diet   Mitral regurgitation    Mitral valve prolapse    Osteopenia 2021   Paroxysmal  atrial fibrillation (HCC)    Seizure-like activity (HCC)    Right gaze preference at baseline, essential termor at baseline, intermittent reduced responsiveness secondary to toxic/metabolic processes and poor cognitive reserve   Tremor      Significant Hospital Events: Including procedures, antibiotic start and stop dates in addition to other pertinent events   8/20 admitted to [facility] w/ PEG tube malposition need for replacement and severe electrolyte abnormality; pccm consulted for vent management  Interim History / Subjective:  See above  Objective   Blood pressure 92/74, pulse (!) 118, temperature 98.7 F (37.1 C), temperature source Oral, resp. rate (!) 26, height 5\' 10"  (1.778 m), weight 52 kg, SpO2 96%.    FiO2 (%):  [40 %] 40 % Set Rate:  [26 bmp] 26 bmp PEEP:  [8 cmH20] 8 cmH20 Plateau Pressure:  [28 cmH20] 28 cmH20  No intake or output data in the 24 hours ending 09/09/22 1709 Filed Weights   09/09/22 1301  Weight: 52 kg    Examination: General: chronic trach patient on mech vent,  HEENT: MM pink/moist; trach in place, copious secretions Neuro: Alert; does not follow commands, essential tremor noted CV: s1s2, tachy 110s, no m/r/g PULM:  dim clear BS bilaterally; trached on mech vent  GI: soft, PEG in place, erythema (possible candidal rash) noted and dried blood on abdomen Extremities: contractured, no edema Skin: no rashes or lesions    Resolved Hospital Problem list     Assessment & Plan:  Chronic hypoxic/hypercapnic respiratory failure s/p trach/vent dependent Plan: - maintain  vent support. He is at base baseline vent settings - routine trach care  Hypokalemia Hypomagnesemia Hyponatremia Plan: -replete electrolytes, he has already received potassium 40 meq IV and 2g mag - will add enteral potassium replacement as well  - IVF resuscitation  Recommend admission to [facility]. We will be available for vent management.   Best Practice (right click and "Reselect  all SmartList Selections" daily)   Per primary  Labs   CBC: Recent Labs  Lab 09/09/22 1417 09/09/22 1653  WBC 13.4*  --   NEUTROABS 12.1*  --   HGB 8.1* 9.2*  HCT 28.1* 27.0*  MCV 86.2  --   PLT 355  --     Basic Metabolic Panel: Recent Labs  Lab 09/09/22 1326 09/09/22 1653  NA 134* 133*  K 2.4* 3.0*  CL 78*  --   CO2 >45*  --   GLUCOSE 112*  --   BUN <5*  --   CREATININE 0.43*  --   CALCIUM 8.1*  --   MG 1.5*  --    GFR: Estimated Creatinine Clearance: 53.3 mL/min (A) (by C-G formula based on SCr of 0.43 mg/dL (L)). Recent Labs  Lab 09/09/22 1417  WBC 13.4*    Liver Function Tests: No results for input(s): "AST", "ALT", "ALKPHOS", "BILITOT", "PROT", "ALBUMIN" in the last 168 hours. No results for input(s): "LIPASE", "AMYLASE" in the last 168 hours. No results for input(s): "AMMONIA" in the last 168 hours.  ABG    Component Value Date/Time   PHART 7.452 (H) 07/04/2022 0935   PCO2ART 59.7 (H) 07/04/2022 0935   PO2ART 115 (H) 07/04/2022 0935   HCO3 53.7 (H) 09/09/2022 1653   TCO2 >50 (H) 09/09/2022 1653   ACIDBASEDEF 2.0 05/30/2021 0052   O2SAT 99 09/09/2022 1653     Coagulation Profile: No results for input(s): "INR", "PROTIME" in the last 168 hours.  Cardiac Enzymes: No results for input(s): "CKTOTAL", "CKMB", "CKMBINDEX", "TROPONINI" in the last 168 hours.  HbA1C: Hgb A1c MFr Bld  Date/Time Value Ref Range Status  01/04/2022 11:22 PM 5.3 4.8 - 5.6 % Final    Comment:    (NOTE)         Prediabetes: 5.7 - 6.4         Diabetes: >6.4         Glycemic control for adults with diabetes: <7.0   04/11/2021 09:12 PM 5.5 4.8 - 5.6 % Final    Comment:    (NOTE) Pre diabetes:          5.7%-6.4%  Diabetes:              >6.4%  Glycemic control for   <7.0% adults with diabetes     CBG: No results for input(s): "GLUCAP" in the last 168 hours.  Review of Systems:   Patient is trached on vent; therefore, history has been obtained from chart  review.    Past Medical History:  He,  has a past medical history of CHF (congestive heart failure) (HCC), Coronary artery disease, Essential tremor, GERD (gastroesophageal reflux disease), GI bleed (2019), Headache, Hypothyroidism, Low blood sugar, Mitral regurgitation, Mitral valve prolapse, Osteopenia (2021), Paroxysmal atrial fibrillation (HCC), Seizure-like activity (HCC), and Tremor.   Surgical History:   Past Surgical History:  Procedure Laterality Date   COLONOSCOPY WITH PROPOFOL N/A 02/19/2017   Procedure: COLONOSCOPY WITH PROPOFOL;  Surgeon: Charlott Rakes, MD;  Location: [facility];  Service: Endoscopy;  Laterality: N/A;   IR GASTROSTOMY TUBE MOD SED  10/18/2020   IR IVC FILTER PLMT / S&I /IMG GUID/MOD SED  11/01/2020   IR REPLACE G-TUBE SIMPLE WO FLUORO  03/15/2022   IR REPLACE G-TUBE SIMPLE WO FLUORO  06/30/2022   IR REPLACE G-TUBE SIMPLE WO FLUORO  09/09/2022   laser eye surgery for retina detachment     MITRAL VALVE REPAIR  01/2003   monitor  02/05/2006   polyp removal     TONSILLECTOMY     tooth removal     as a teenager   TRACHEOSTOMY TUBE PLACEMENT N/A 10/26/2020   Procedure: TRACHEOSTOMY;  Surgeon: Drema Halon, MD;  Location: [facility] OR;  Service: ENT;  Laterality: N/A;     Social History:   reports that he has never smoked. He has never used smokeless tobacco. He reports current alcohol use. He reports that he does not use drugs.   Family History:  His family history includes Asthma in his daughter; CAD in his mother; Epilepsy in his son; Heart disease in his mother; Heart failure in his father, maternal grandmother, and paternal grandmother; Migraines in his mother; Pneumonia in his maternal grandfather; Skin cancer in his father; Stroke in his paternal grandfather; Tremor in his father; Valvular heart disease in his father. There is no history of Parkinson's disease.   Allergies Allergies  Allergen Reactions   Prednisone Other (See Comments)    Documented  on MAR Unknown reaction   Cortisone Other (See Comments)    Documented on MAR Unknown reaction     Home Medications  Prior to Admission medications   Medication Sig Start Date End Date Taking? Authorizing Provider  acetaminophen (TYLENOL) 325 MG tablet Place 650 mg into feeding tube every 8 (eight) hours as needed for fever, headache or moderate pain.   Yes [provider]  amiodarone (PACERONE) 200 MG tablet Place 1 tablet (200 mg total) into feeding tube daily. 07/05/22  Yes Simonne Martinet, NP  amLODipine (NORVASC) 5 MG tablet Place 5 mg into feeding tube daily.   Yes [provider]  budesonide (PULMICORT) 0.5 MG/2ML nebulizer solution Take 0.5 mg by nebulization every 12 (twelve) hours.   Yes [provider]  folic acid (FOLVITE) 1 MG tablet Take 1 tablet (1 mg total) by mouth daily. Patient taking differently: Place 1 mg into feeding tube daily. 09/29/20  Yes Azucena Fallen, MD  gabapentin (NEURONTIN) 100 MG capsule 200 mg See admin instructions. 200 mg via tube every 12 hours.   Yes [provider]  labetalol (NORMODYNE) 5 MG/ML injection Inject 5 mg into the vein every 2 (two) hours as needed (I10 - Essential (primary) hypertension (no indication provided on [facility])).   Yes [provider]  lansoprazole (PREVACID) 30 MG capsule Place 30 mg into feeding tube daily at 12 noon.   Yes [provider]  levalbuterol (XOPENEX) 1.25 MG/3ML nebulizer solution Take 1.25 mg by nebulization 4 (four) times daily.   Yes [provider]  levothyroxine (SYNTHROID) 25 MCG tablet Place 25 mcg into feeding tube daily before breakfast.   Yes [provider]  LORazepam (ATIVAN) 1 MG tablet Take 1 mg by mouth every 8 (eight) hours as needed for anxiety.   Yes [provider]  Multiple Vitamin (MULTIVITAMIN) tablet Place 1 tablet into feeding tube daily.   Yes [provider]  Omega-3 Fatty Acids (FISH OIL) 1000 MG  CAPS Place 1,000 mg into feeding tube daily.   Yes [provider]  polyethylene glycol (MIRALAX / GLYCOLAX)  17 g packet Place 17 g into feeding tube daily as needed (constipation).   Yes [provider]  sertraline (ZOLOFT) 100 MG tablet Place 100 mg into feeding tube daily.   Yes [provider]  sodium chloride (OCEAN) 0.65 % SOLN nasal spray Place 1 spray into both nostrils every 8 (eight) hours.   Yes [provider]     Critical care time: 45 minutes    The patient is critically ill due to hypokalemia, vent management.  Critical care was necessary to treat or prevent imminent or life-threatening deterioration.  Critical care was time spent personally by me on the following activities: development of treatment plan with patient and/or surrogate as well as nursing, discussions with consultants, evaluation of patient's response to treatment, examination of patient, obtaining history from patient or surrogate, ordering and performing treatments and interventions, ordering and review of laboratory studies, ordering and review of radiographic studies, pulse oximetry, re-evaluation of patient's condition and participation in multidisciplinary rounds.   Critical Care Time devoted to patient care services described in this note is 45 minutes. This time reflects time of care of this signee Charlott Holler . This critical care time does not reflect separately billable procedures or procedure time, teaching time or supervisory time of PA/NP/Med student/Med Resident etc but could involve care discussion time.       Mickel Baas Pulmonary and Critical Care Medicine 09/09/2022 6:07 PM  Pager: see AMION  If no response to pager , please call critical care on call (see AMION) until 7pm After 7:00 pm call Elink

## 2022-09-09 NOTE — H&P (Cosign Needed)
Date: 09/09/2022               Patient Name:  Austin Davis MRN: [id number]  DOB: 02/01/1940 Age / Sex: 82 y.o., male   PCP: Austin Keas Lovell Sheehan, MD         Medical Service: Internal Medicine Teaching Service         Attending Physician: Dr. Ginnie Smart, MD      First Contact: Dr. Laretta Bolster, MD Pager [phone]    Second Contact: Dr. Rudene Christians, DO Pager [phone]         After Hours (After 5p/  First Contact Pager: [phone]  weekends / holidays): Second Contact Pager: [phone]   SUBJECTIVE   Chief Complaint: PEG tube dislodgment, and electrolyte derangement.  History of Present Illness:   [name] is a 82 year old man with a COPD, HFrEF, hypothyroidism, GERD, who presented to [facility] ED from [facility] for hypokalemia and PEG tube issues. Per chart review, [facility] RN assessed patient's PEG 8/19 and it was noted to be dislodged; on 8/20 (day of admission) she noted blood/gastric fluid draining from PEG stoma.    ED Course: On ED arrival, normothermic with HR 97, BP 99/78, RR 21, SpO2 93%. Labs were notable for  WBC 13.4, Hgb 8.1, Plt 355. Na 134, K 2.4, Ccl 78, CO2 >45, BUN < 5, Cr 0.43. Mg 1.5.  IR was called for bedside replacement of PEG tube which was completed in ED (24Fr).  Abdominal XR post-procedure confirmed correct placement.  Past Medical History  Past Medical History:  Diagnosis Date   Aspiration pneumonia (HCC) 03/25/2021   CHF (congestive heart failure) (HCC)    Coronary artery disease    Essential tremor    GERD (gastroesophageal reflux disease)    GI bleed 2019   hospitalized at [facility] for one week   Headache    since childhood   Hypothyroidism    Low blood sugar    since childhood, controlled by diet   Mitral regurgitation    Mitral valve prolapse    Osteopenia 2021   Paroxysmal atrial fibrillation (HCC)    Seizure-like activity (HCC)    Right gaze preference at baseline, essential termor at baseline, intermittent  reduced responsiveness secondary to toxic/metabolic processes and poor cognitive reserve   Tremor      Meds:   Current Meds  Medication Sig   acetaminophen (TYLENOL) 325 MG tablet Place 650 mg into feeding tube every 8 (eight) hours as needed for fever, headache or moderate pain.   amiodarone (PACERONE) 200 MG tablet Place 1 tablet (200 mg total) into feeding tube daily.   amLODipine (NORVASC) 5 MG tablet Place 5 mg into feeding tube daily.   budesonide (PULMICORT) 0.5 MG/2ML nebulizer solution Take 0.5 mg by nebulization every 12 (twelve) hours.   folic acid (FOLVITE) 1 MG tablet Take 1 tablet (1 mg total) by mouth daily. (Patient taking differently: Place 1 mg into feeding tube daily.)   gabapentin (NEURONTIN) 100 MG capsule 200 mg See admin instructions. 200 mg via tube every 12 hours.   labetalol (NORMODYNE) 5 MG/ML injection Inject 5 mg into the vein every 2 (two) hours as needed (I10 - Essential (primary) hypertension (no indication provided on [facility])).   lansoprazole (PREVACID) 30 MG capsule Place 30 mg into feeding tube daily at 12 noon.   levalbuterol (XOPENEX) 1.25 MG/3ML nebulizer solution Take 1.25 mg by nebulization 4 (four) times daily.   levothyroxine (SYNTHROID) 25 MCG tablet Place 25 mcg  into feeding tube daily before breakfast.   LORazepam (ATIVAN) 1 MG tablet Take 1 mg by mouth every 8 (eight) hours as needed for anxiety.   Multiple Vitamin (MULTIVITAMIN) tablet Place 1 tablet into feeding tube daily.   Omega-3 Fatty Acids (FISH OIL) 1000 MG CAPS Place 1,000 mg into feeding tube daily.   polyethylene glycol (MIRALAX / GLYCOLAX) 17 g packet Place 17 g into feeding tube daily as needed (constipation).   sertraline (ZOLOFT) 100 MG tablet Place 100 mg into feeding tube daily.   sodium chloride (OCEAN) 0.65 % SOLN nasal spray Place 1 spray into both nostrils every 8 (eight) hours.   [DISCONTINUED] LORazepam (ATIVAN) 0.5 MG tablet Place 0.5 mg into feeding tube every 8 (eight)  hours as needed for anxiety.    Past Surgical History  Past Surgical History:  Procedure Laterality Date   COLONOSCOPY WITH PROPOFOL N/A 02/19/2017   Procedure: COLONOSCOPY WITH PROPOFOL;  Surgeon: Charlott Rakes, MD;  Location: [facility];  Service: Endoscopy;  Laterality: N/A;   IR GASTROSTOMY TUBE MOD SED  10/18/2020   IR IVC FILTER PLMT / S&I /IMG GUID/MOD SED  11/01/2020   IR REPLACE G-TUBE SIMPLE WO FLUORO  03/15/2022   IR REPLACE G-TUBE SIMPLE WO FLUORO  06/30/2022   IR REPLACE G-TUBE SIMPLE WO FLUORO  09/09/2022   laser eye surgery for retina detachment     MITRAL VALVE REPAIR  01/2003   monitor  02/05/2006   polyp removal     TONSILLECTOMY     tooth removal     as a teenager   TRACHEOSTOMY TUBE PLACEMENT N/A 10/26/2020   Procedure: TRACHEOSTOMY;  Surgeon: Drema Halon, MD;  Location: [facility] OR;  Service: ENT;  Laterality: N/A;    Social:  Lives [facility] Occupation: Retired Level of Function: PCP: Patrecia Pour, MD Substances: None  Family History:  Reviewed and non contributory.  Allergies: Allergies as of 09/09/2022 - Review Complete 09/09/2022  Allergen Reaction Noted   Prednisone Other (See Comments) 05/03/2013   Cortisone Other (See Comments) 11/16/2019   Review of Systems: A complete ROS was negative except as per HPI.   OBJECTIVE:   Physical Exam: Blood pressure 96/71, pulse 100, temperature 98.1 F (36.7 C), temperature source Oral, resp. rate (!) 26, height 5\' 10"  (1.778 m), weight 52 kg, SpO2 94%.   Constitutional: Chronic trach patient on mechanical ventilation.  HENT: Moist mucous membrane, trach in place copious secretions.   Cardio:Regular rate and rhythm. No murmurs, rubs, or gallops. Pulm:Clear to auscultation bilaterally. Normal work of breathing on room air. Abdomen: Soft, PEG in place, dried blood on abdomen.   Skin: Contractured, no edema Neuro:Alert and oriented x3. No focal deficit noted. Psych:Pleasant mood and  affect.  Labs: CBC    Component Value Date/Time   WBC 13.4 (H) 09/09/2022 1417   RBC 3.26 (L) 09/09/2022 1417   HGB 9.2 (L) 09/09/2022 1653   HGB 15.8 04/06/2020 1555   HCT 27.0 (L) 09/09/2022 1653   HCT 46.1 04/06/2020 1555   PLT 355 09/09/2022 1417   PLT 230 04/06/2020 1555   MCV 86.2 09/09/2022 1417   MCV 94 04/06/2020 1555   MCH 24.8 (L) 09/09/2022 1417   MCHC 28.8 (L) 09/09/2022 1417   RDW 19.9 (H) 09/09/2022 1417   RDW 12.8 04/06/2020 1555   LYMPHSABS 0.3 (L) 09/09/2022 1417   MONOABS 0.7 09/09/2022 1417   EOSABS 0.1 09/09/2022 1417   BASOSABS 0.0 09/09/2022 1417     CMP  Component Value Date/Time   NA 133 (L) 09/09/2022 1653   NA 143 04/06/2020 1555   K 3.0 (L) 09/09/2022 1653   CL 78 (L) 09/09/2022 1326   CO2 >45 (H) 09/09/2022 1326   GLUCOSE 112 (H) 09/09/2022 1326   BUN <5 (L) 09/09/2022 1326   BUN 21 04/06/2020 1555   CREATININE 0.43 (L) 09/09/2022 1326   CREATININE 0.97 01/30/2015 1115   CALCIUM 8.1 (L) 09/09/2022 1326   PROT 6.2 (L) 07/03/2022 0236   PROT 6.7 04/06/2020 1555   ALBUMIN 2.1 (L) 07/03/2022 0236   ALBUMIN 4.7 04/06/2020 1555   AST 19 07/03/2022 0236   ALT 24 07/03/2022 0236   ALKPHOS 98 07/03/2022 0236   BILITOT 0.3 07/03/2022 0236   BILITOT 0.3 04/06/2020 1555   GFRNONAA >60 09/09/2022 1326   GFRAA >60 02/17/2017 0145    Imaging:  DG ABDOMEN PEG TUBE LOCATION  Result Date: 09/09/2022 CLINICAL DATA:  82 year old male with displaced gastrostomy. He presents for a replacement. EXAM: ABDOMEN - 1 VIEW COMPARISON:  Chest x-ray 07/01/2022, abdominal film 06/30/2022 FINDINGS: Limited plain film of the abdomen. Percutaneous gastrostomy terminates within the stomach lumen with contrast partially opacifying the stomach. Contrast additionally within: Extending to the rectum. No abnormal distention of small bowel or colon. IVC filter in place. IMPRESSION: Percutaneous gastrostomy confirmed within the stomach lumen. Electronically Signed   By:  Gilmer Mor D.O.   On: 09/09/2022 16:19   IR REPLACE G-TUBE SIMPLE WO FLUORO  Result Date: 09/09/2022 INDICATION: 82 year old male with accidental displacement of percutaneous gastrostomy. EXAM: IR TUBE PLACEMENT/REPLACEMENT/CHECK MEDICATIONS: None ANESTHESIA/SEDATION: None CONTRAST:  None FLUOROSCOPY: Radiation Exposure Index (as provided by the fluoroscopic device): 0 mGy Kerma COMPLICATIONS: None PROCEDURE: Informed written consent was obtained from the patient and the patient's family after a thorough discussion of the procedural risks, benefits and alternatives. A timeout as performed prior to the initiation of the procedure. The epigastrium was exposed, identifying the patent ostomy into the stomach lumen. The prior gastrostomy have been completely removed. There was some discharge of gastric contents through the ostomy. A new 24 French balloon retention gastrostomy was placed through the ostomy. The balloon was inflated and the flange was tightened. Contrast was injected, which confirmed location in the stomach. . Patient tolerated the procedure well and remained hemodynamically stable throughout. No complications were encountered and no significant blood loss encountered. IMPRESSION: Bedside rescue of a displaced gastrostomy, with placement of a new 24 French balloon retention gastrostomy. Electronically Signed   By: Gilmer Mor D.O.   On: 09/09/2022 16:17     EKG: personally reviewed my interpretation is atrial tachycardia, with prolonged QT interval..   ASSESSMENT & PLAN:   Assessment & Plan by Problem: Principal Problem:   Hypokalemia  Mr. Maywood is a 82 year old man with a COPD, HFrEF, hypothyroidism, GERD, who presented to [facility] ED from [facility] for PEG tube dislodgment and electrolytes and derangement.  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 PEEP of 8, FiO2 60% respiratory rate of 26.  PCCM was consulted, and given vent  settings are still stable, will be following peripherally.  Hypokalemia  Hypomagnesemia Hyponatremia On presentation, K2.5, Mg 1.2, NA 134. I suspect electrolytes derangement is multifactorial likely from PEG tube dislodging and/or inadequate tube feed.  Patient had an IR guided G-tube replacement dose confirmed by x-ray.  He had already received 40 mEq IV potassium and 2 g of magnesium.  -Enteral potassium replacement -q6hr potassium checks. -Cardiac  monitoring. -Dietitian consult  Chronic medical conditions  A-fib -Amiodarone 200 mg  Hypertension -Amlodipine 5 mg  Hypothyroidism - Synthroid 25 mcg tablet.  GERD -Lansoprazole 30 mg  Asthma Albuterol 1.25 mg/48M nebulizer  Depression - Sertraline 100 mg  Pain management -Tylenol  Constipation Miralax packet 17 g.  Diet: Tube Feeds VTE: SCDs IVF: None,None Code: Full  Dispo: Admit patient to Observation with expected length of stay less than 2 midnights.  Signed:  Laretta Davis, M.D.  Internal Medicine Resident, PGY-1 [facility]  Pager: [phone] 6:29 PM, 09/09/2022   **Please contact the on call pager after 5 pm and on weekends at [phone].**

## 2022-09-09 NOTE — Consult Note (Signed)
NAME:  Austin Davis, MRN:  [id number], DOB:  September 03, 1940, LOS: 0 ADMISSION DATE:  09/09/2022, CONSULTATION DATE:  09/09/2022 REFERRING MD:  Silverio Lay - EDP CHIEF COMPLAINT:  Vent management   History of Present Illness:  82 year old man who presented to [facility] from [facility] 8/20 for electrolyte derangements and PEG tube dislodgement. 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 Recent admission to [facility] 6/9 - 6/14 for sepsis/hypotension; Resp Cx grew pseudomonas aeruginosa and providencia stuartii (treated with meropenem).   Presented to [facility] ED from [facility] for hypokalemia and PEG tube issues. Per chart review, [facility] RN assessed patient's PEG 8/19 and it was noted to be dislodged; on 8/20 (day of admission) she noted blood/gastric fluid draining from PEG stoma. On ED arrival, normothermic with HR 97, BP 99/78, RR 21, SpO2 93%. Labs were notable for WBC 13.4, Hgb 8.1, Plt 355. Na 134, K 2.4, Ccl 78, CO2 >45, BUN < 5, Cr 0.43. Mg 1.5. IR was called for bedside replacement of PEG tube which was completed in ED (24Fr). Abdominal XR post-procedure confirmed correct placement.   PCCM consulted for vent management and assistance with hyperkalemia.  Pertinent  Medical History   Past Medical History:  Diagnosis Date   Aspiration pneumonia (HCC) 03/25/2021   CHF (congestive heart failure) (HCC)    Coronary artery disease    Essential tremor    GERD (gastroesophageal reflux disease)    GI bleed 2019   hospitalized at [facility] for one week   Headache    since childhood   Hypothyroidism    Low blood sugar    since childhood, controlled by diet   Mitral regurgitation    Mitral valve prolapse    Osteopenia 2021   Paroxysmal  atrial fibrillation (HCC)    Seizure-like activity (HCC)    Right gaze preference at baseline, essential termor at baseline, intermittent reduced responsiveness secondary to toxic/metabolic processes and poor cognitive reserve   Tremor      Significant Hospital Events: Including procedures, antibiotic start and stop dates in addition to other pertinent events   8/20 admitted to [facility] w/ PEG tube malposition need for replacement and severe electrolyte abnormality; pccm consulted for vent management  Interim History / Subjective:  See above  Objective   Blood pressure 92/74, pulse (!) 118, temperature 98.7 F (37.1 C), temperature source Oral, resp. rate (!) 26, height 5\' 10"  (1.778 m), weight 52 kg, SpO2 96%.    FiO2 (%):  [40 %] 40 % Set Rate:  [26 bmp] 26 bmp PEEP:  [8 cmH20] 8 cmH20 Plateau Pressure:  [28 cmH20] 28 cmH20  No intake or output data in the 24 hours ending 09/09/22 1709 Filed Weights   09/09/22 1301  Weight: 52 kg    Examination: General: chronic trach patient on mech vent,  HEENT: MM pink/moist; trach in place, copious secretions Neuro: Alert; does not follow commands, essential tremor noted CV: s1s2, tachy 110s, no m/r/g PULM:  dim clear BS bilaterally; trached on mech vent  GI: soft, PEG in place, erythema (possible candidal rash) noted and dried blood on abdomen Extremities: contractured, no edema Skin: no rashes or lesions    Resolved Hospital Problem list     Assessment & Plan:  Chronic hypoxic/hypercapnic respiratory failure s/p trach/vent dependent Plan: - maintain  vent support. He is at base baseline vent settings - routine trach care  Hypokalemia Hypomagnesemia Hyponatremia Plan: -replete electrolytes, he has already received potassium 40 meq IV and 2g mag - will add enteral potassium replacement as well  - IVF resuscitation  Recommend admission to [facility]. We will be available for vent management.   Best Practice (right click and "Reselect  all SmartList Selections" daily)   Per primary  Labs   CBC: Recent Labs  Lab 09/09/22 1417 09/09/22 1653  WBC 13.4*  --   NEUTROABS 12.1*  --   HGB 8.1* 9.2*  HCT 28.1* 27.0*  MCV 86.2  --   PLT 355  --     Basic Metabolic Panel: Recent Labs  Lab 09/09/22 1326 09/09/22 1653  NA 134* 133*  K 2.4* 3.0*  CL 78*  --   CO2 >45*  --   GLUCOSE 112*  --   BUN <5*  --   CREATININE 0.43*  --   CALCIUM 8.1*  --   MG 1.5*  --    GFR: Estimated Creatinine Clearance: 53.3 mL/min (A) (by C-G formula based on SCr of 0.43 mg/dL (L)). Recent Labs  Lab 09/09/22 1417  WBC 13.4*    Liver Function Tests: No results for input(s): "AST", "ALT", "ALKPHOS", "BILITOT", "PROT", "ALBUMIN" in the last 168 hours. No results for input(s): "LIPASE", "AMYLASE" in the last 168 hours. No results for input(s): "AMMONIA" in the last 168 hours.  ABG    Component Value Date/Time   PHART 7.452 (H) 07/04/2022 0935   PCO2ART 59.7 (H) 07/04/2022 0935   PO2ART 115 (H) 07/04/2022 0935   HCO3 53.7 (H) 09/09/2022 1653   TCO2 >50 (H) 09/09/2022 1653   ACIDBASEDEF 2.0 05/30/2021 0052   O2SAT 99 09/09/2022 1653     Coagulation Profile: No results for input(s): "INR", "PROTIME" in the last 168 hours.  Cardiac Enzymes: No results for input(s): "CKTOTAL", "CKMB", "CKMBINDEX", "TROPONINI" in the last 168 hours.  HbA1C: Hgb A1c MFr Bld  Date/Time Value Ref Range Status  01/04/2022 11:22 PM 5.3 4.8 - 5.6 % Final    Comment:    (NOTE)         Prediabetes: 5.7 - 6.4         Diabetes: >6.4         Glycemic control for adults with diabetes: <7.0   04/11/2021 09:12 PM 5.5 4.8 - 5.6 % Final    Comment:    (NOTE) Pre diabetes:          5.7%-6.4%  Diabetes:              >6.4%  Glycemic control for   <7.0% adults with diabetes     CBG: No results for input(s): "GLUCAP" in the last 168 hours.  Review of Systems:   Patient is trached on vent; therefore, history has been obtained from chart  review.    Past Medical History:  He,  has a past medical history of CHF (congestive heart failure) (HCC), Coronary artery disease, Essential tremor, GERD (gastroesophageal reflux disease), GI bleed (2019), Headache, Hypothyroidism, Low blood sugar, Mitral regurgitation, Mitral valve prolapse, Osteopenia (2021), Paroxysmal atrial fibrillation (HCC), Seizure-like activity (HCC), and Tremor.   Surgical History:   Past Surgical History:  Procedure Laterality Date   COLONOSCOPY WITH PROPOFOL N/A 02/19/2017   Procedure: COLONOSCOPY WITH PROPOFOL;  Surgeon: Charlott Rakes, MD;  Location: [facility];  Service: Endoscopy;  Laterality: N/A;   IR GASTROSTOMY TUBE MOD SED  10/18/2020   IR IVC FILTER PLMT / S&I /IMG GUID/MOD SED  11/01/2020   IR REPLACE G-TUBE SIMPLE WO FLUORO  03/15/2022   IR REPLACE G-TUBE SIMPLE WO FLUORO  06/30/2022   IR REPLACE G-TUBE SIMPLE WO FLUORO  09/09/2022   laser eye surgery for retina detachment     MITRAL VALVE REPAIR  01/2003   monitor  02/05/2006   polyp removal     TONSILLECTOMY     tooth removal     as a teenager   TRACHEOSTOMY TUBE PLACEMENT N/A 10/26/2020   Procedure: TRACHEOSTOMY;  Surgeon: Drema Halon, MD;  Location: [facility] OR;  Service: ENT;  Laterality: N/A;     Social History:   reports that he has never smoked. He has never used smokeless tobacco. He reports current alcohol use. He reports that he does not use drugs.   Family History:  His family history includes Asthma in his daughter; CAD in his mother; Epilepsy in his son; Heart disease in his mother; Heart failure in his father, maternal grandmother, and paternal grandmother; Migraines in his mother; Pneumonia in his maternal grandfather; Skin cancer in his father; Stroke in his paternal grandfather; Tremor in his father; Valvular heart disease in his father. There is no history of Parkinson's disease.   Allergies Allergies  Allergen Reactions   Prednisone Other (See Comments)    Documented  on MAR Unknown reaction   Cortisone Other (See Comments)    Documented on MAR Unknown reaction     Home Medications  Prior to Admission medications   Medication Sig Start Date End Date Taking? Authorizing Provider  acetaminophen (TYLENOL) 325 MG tablet Place 650 mg into feeding tube every 8 (eight) hours as needed for fever, headache or moderate pain.   Yes [provider]  amiodarone (PACERONE) 200 MG tablet Place 1 tablet (200 mg total) into feeding tube daily. 07/05/22  Yes Simonne Martinet, NP  amLODipine (NORVASC) 5 MG tablet Place 5 mg into feeding tube daily.   Yes [provider]  budesonide (PULMICORT) 0.5 MG/2ML nebulizer solution Take 0.5 mg by nebulization every 12 (twelve) hours.   Yes [provider]  folic acid (FOLVITE) 1 MG tablet Take 1 tablet (1 mg total) by mouth daily. Patient taking differently: Place 1 mg into feeding tube daily. 09/29/20  Yes Azucena Fallen, MD  gabapentin (NEURONTIN) 100 MG capsule 200 mg See admin instructions. 200 mg via tube every 12 hours.   Yes [provider]  labetalol (NORMODYNE) 5 MG/ML injection Inject 5 mg into the vein every 2 (two) hours as needed (I10 - Essential (primary) hypertension (no indication provided on [facility])).   Yes [provider]  lansoprazole (PREVACID) 30 MG capsule Place 30 mg into feeding tube daily at 12 noon.   Yes [provider]  levalbuterol (XOPENEX) 1.25 MG/3ML nebulizer solution Take 1.25 mg by nebulization 4 (four) times daily.   Yes [provider]  levothyroxine (SYNTHROID) 25 MCG tablet Place 25 mcg into feeding tube daily before breakfast.   Yes [provider]  LORazepam (ATIVAN) 1 MG tablet Take 1 mg by mouth every 8 (eight) hours as needed for anxiety.   Yes [provider]  Multiple Vitamin (MULTIVITAMIN) tablet Place 1 tablet into feeding tube daily.   Yes [provider]  Omega-3 Fatty Acids (FISH OIL) 1000 MG  CAPS Place 1,000 mg into feeding tube daily.   Yes [provider]  polyethylene glycol (MIRALAX / GLYCOLAX)  17 g packet Place 17 g into feeding tube daily as needed (constipation).   Yes [provider]  sertraline (ZOLOFT) 100 MG tablet Place 100 mg into feeding tube daily.   Yes [provider]  sodium chloride (OCEAN) 0.65 % SOLN nasal spray Place 1 spray into both nostrils every 8 (eight) hours.   Yes [provider]     Critical care time: 45 minutes    The patient is critically ill due to hypokalemia, vent management.  Critical care was necessary to treat or prevent imminent or life-threatening deterioration.  Critical care was time spent personally by me on the following activities: development of treatment plan with patient and/or surrogate as well as nursing, discussions with consultants, evaluation of patient's response to treatment, examination of patient, obtaining history from patient or surrogate, ordering and performing treatments and interventions, ordering and review of laboratory studies, ordering and review of radiographic studies, pulse oximetry, re-evaluation of patient's condition and participation in multidisciplinary rounds.   Critical Care Time devoted to patient care services described in this note is 45 minutes. This time reflects time of care of this signee Charlott Holler . This critical care time does not reflect separately billable procedures or procedure time, teaching time or supervisory time of PA/NP/Med student/Med Resident etc but could involve care discussion time.       Mickel Baas Pulmonary and Critical Care Medicine 09/09/2022 6:07 PM  Pager: see AMION  If no response to pager , please call critical care on call (see AMION) until 7pm After 7:00 pm call Elink

## 2022-09-09 NOTE — ED Provider Notes (Signed)
[place] EMERGENCY DEPARTMENT AT [facility] Provider Note   CSN: [id number] Arrival date & time: 09/09/22  1241     History  Chief Complaint  Patient presents with   abnormal labs    Coming from long term care facility. Chronic trach vent pt coming in for hypokalemia and also a ozing hematoma to the abd. Pts peg tube was replaced yesterday and is now ozing. Pt is otherwise A & O and follows commands. Nonverbal.      Austin Davis is a 82 y.o. 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 History was confirmed by nurse at [facility]. Nurse reports that she assessed pts PEG yesterday and say it was not in place, but today she noticed blood and gastric fluid draining from the stoma. Due to PEG issue, pt sent here for correction of K+ and assessment of PEG.   HPI     Home Medications Prior to Admission medications   Medication Sig Start Date End Date Taking? Authorizing Provider  acetaminophen (TYLENOL) 325 MG tablet Place 650 mg into feeding tube every 8 (eight) hours as needed for fever, headache or moderate pain.   Yes [provider]  amiodarone (PACERONE) 200 MG tablet Place 1 tablet (200 mg total) into feeding tube daily. 07/05/22  Yes Simonne Martinet, NP  amLODipine (NORVASC) 5 MG tablet Place 5 mg into feeding tube daily.   Yes [provider]  budesonide (PULMICORT) 0.5 MG/2ML nebulizer solution Take 0.5 mg by nebulization every 12 (twelve) hours.   Yes [provider]  folic acid (FOLVITE) 1 MG tablet Take 1 tablet (1 mg total) by mouth daily. Patient taking differently: Place 1 mg into feeding tube daily. 09/29/20  Yes Azucena Fallen, MD  gabapentin (NEURONTIN) 100 MG capsule 200 mg See admin instructions. 200 mg via tube every 12 hours.   Yes [provider]  labetalol  (NORMODYNE) 5 MG/ML injection Inject 5 mg into the vein every 2 (two) hours as needed (I10 - Essential (primary) hypertension (no indication provided on [facility])).   Yes [provider]  lansoprazole (PREVACID) 30 MG capsule Place 30 mg into feeding tube daily at 12 noon.   Yes [provider]  levalbuterol (XOPENEX) 1.25 MG/3ML nebulizer solution Take 1.25 mg by nebulization 4 (four) times daily.   Yes [provider]  levothyroxine (SYNTHROID) 25 MCG tablet Place 25 mcg into feeding tube daily before breakfast.   Yes [provider]  LORazepam (ATIVAN) 1 MG tablet Take 1 mg by mouth every 8 (eight) hours as needed for anxiety.   Yes [provider]  Multiple Vitamin (MULTIVITAMIN) tablet Place 1 tablet into feeding tube daily.   Yes [provider]  Omega-3 Fatty Acids (FISH OIL) 1000 MG CAPS Place 1,000 mg into feeding tube daily.   Yes [provider]  polyethylene glycol (MIRALAX / GLYCOLAX) 17 g packet Place 17 g into feeding tube daily as needed (constipation).   Yes [provider]  sertraline (ZOLOFT) 100 MG tablet Place 100 mg into feeding tube daily.   Yes [provider]  sodium chloride (OCEAN) 0.65 % SOLN nasal spray Place 1 spray into both nostrils every 8 (eight) hours.   Yes [provider]      Allergies    Prednisone and Cortisone  Review of Systems   Review of Systems  Skin:  Positive for wound.    Physical Exam Updated Vital Signs BP 99/78   Pulse 97   Temp 98.4 F (36.9 C) (Oral)   Resp (!) 21   Ht 5\' 10"  (1.778 m)   Wt 52 kg   SpO2 93%   BMI 16.45 kg/m  Physical Exam Vitals and nursing note reviewed.  Constitutional:      General: He is not in acute distress.    Appearance: He is not toxic-appearing.  HENT:     Head: Normocephalic and atraumatic.  Eyes:     General: No scleral icterus.    Conjunctiva/sclera: Conjunctivae normal.  Cardiovascular:     Rate and  Rhythm: Normal rate and regular rhythm.     Pulses: Normal pulses.     Heart sounds: Normal heart sounds.  Pulmonary:     Effort: Pulmonary effort is normal. No respiratory distress.     Breath sounds: Normal breath sounds.  Abdominal:     General: Abdomen is flat. Bowel sounds are normal.     Palpations: Abdomen is soft.     Comments: PEG not in place, stoma draining blood tinged gastric fluid, surrounding skin erythema.   Skin:    General: Skin is warm and dry.     Findings: No lesion.  Neurological:     General: No focal deficit present.     Mental Status: He is alert and oriented to person, place, and time. Mental status is at baseline.     ED Results / Procedures / Treatments   Labs (all labs ordered are listed, but only abnormal results are displayed) Labs Reviewed  BASIC METABOLIC PANEL - Abnormal; Notable for the following components:      Result Value   Sodium 134 (*)    Potassium 2.4 (*)    Chloride 78 (*)    CO2 >45 (*)    Glucose, Bld 112 (*)    BUN <5 (*)    Creatinine, Ser 0.43 (*)    Calcium 8.1 (*)    All other components within normal limits  MAGNESIUM - Abnormal; Notable for the following components:   Magnesium 1.5 (*)    All other components within normal limits  CBC WITH DIFFERENTIAL/PLATELET - Abnormal; Notable for the following components:   WBC 13.4 (*)    RBC 3.26 (*)    Hemoglobin 8.1 (*)    HCT 28.1 (*)    MCH 24.8 (*)    MCHC 28.8 (*)    RDW 19.9 (*)    Neutro Abs 12.1 (*)    Lymphs Abs 0.3 (*)    Abs Immature Granulocytes 0.10 (*)    All other components within normal limits  CBC WITH DIFFERENTIAL/PLATELET    EKG None  Radiology No results found.  Procedures Procedures    Medications Ordered in ED Medications  lactated ringers bolus 500 mL (has no administration in time range)  potassium chloride 10 mEq in 100 mL IVPB (10 mEq Intravenous New Bag/Given 09/09/22 1532)  magnesium sulfate IVPB 2 g 50 mL (2 g Intravenous New  Bag/Given 09/09/22 1606)  diatrizoate meglumine-sodium (GASTROGRAFIN) 66-10 % solution 30 mL (30 mLs Per Tube Given 09/09/22 1534)    ED Course/ Medical Decision Making/ A&P Clinical Course as of 09/09/22 1618  Tue Sep 09, 2022  1339 BP(!): 172/56 [JB]    Clinical Course User Index [JB] Keilah Lemire, Horald Chestnut, PA-C  Medical Decision Making Amount and/or Complexity of Data Reviewed Labs: ordered. Radiology: ordered.  Risk Prescription drug management.   This patient presents to the ED for concern of PEG tube missing and hypokalemia on labs, this involves an extensive number of treatment options, and is a complaint that carries with it a high risk of complications and morbidity.  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  The pertinent results include:   Cbc hgb 8.1, rdw 19.9 consistent w/ prior labs Bmp K 2.4 Mg 1.5   Imaging Studies ordered:  Consult w/ IR, they will see pt prior to ordering imaging   Cardiac Monitoring: / EKG:  The patient was maintained on a cardiac monitor.  I personally viewed and interpreted the cardiac monitored which showed an underlying rhythm of: ordered   Consultations Obtained:  I requested consultation with the IR,  and discussed lab and imaging findings as well as pertinent plan - they recommend: PEG tube placement and confirmation obtained with imaging   Problem List / ED Course / Critical interventions / Medication management  Pt reporting for significant pmhx and abnormal labs  I ordered medication including K and Mg  for correction of  electrolytes  and fluids.  Reevaluation of the patient after these medicines showed that the patient stayed the same I have reviewed the patients home medicines and have made adjustments as needed   Plan Signed off patient w/ Dr Silverio Lay, planning on paging hospitalist for possible admission d/t abnormal labs         Final Clinical Impression(s) / ED Diagnoses Final diagnoses:  None    Rx / DC Orders ED Discharge Orders     None         Smitty Knudsen, PA-C 09/09/22 1650    Derwood Kaplan, MD 09/10/22 [phone]

## 2022-09-09 NOTE — H&P (Signed)
NAME:  ISIAIH SCHWEIKART, MRN:  [id number], DOB:  11/05/1940, LOS: 0 ADMISSION DATE:  09/09/2022, CONSULTATION DATE:  09/09/2022 REFERRING MD:  Silverio Lay - EDP CHIEF COMPLAINT:  Vent management   History of Present Illness:  82 year old man who presented to [facility] from [facility] 8/20 for electrolyte derangements and PEG tube dislodgement. 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 Recent admission to [facility] 6/9 - 6/14 for sepsis/hypotension; Resp Cx grew pseudomonas aeruginosa and providencia stuartii (treated with meropenem).   Presented to [facility] ED from [facility] for hypokalemia and PEG tube issues. Per chart review, [facility] RN assessed patient's PEG 8/19 and it was noted to be dislodged; on 8/20 (day of admission) she noted blood/gastric fluid draining from PEG stoma. On ED arrival, normothermic with HR 97, BP 99/78, RR 21, SpO2 93%. Labs were notable for WBC 13.4, Hgb 8.1, Plt 355. Na 134, K 2.4, Ccl 78, CO2 >45, BUN < 5, Cr 0.43. Mg 1.5. IR was called for bedside replacement of PEG tube which was completed in ED (24Fr). Abdominal XR post-procedure confirmed correct placement.   PCCM consulted for vent management and assistance with hyperkalemia.  Pertinent  Medical History   Past Medical History:  Diagnosis Date   Aspiration pneumonia (HCC) 03/25/2021   CHF (congestive heart failure) (HCC)    Coronary artery disease    Essential tremor    GERD (gastroesophageal reflux disease)    GI bleed 2019   hospitalized at [facility] for one week   Headache    since childhood   Hypothyroidism    Low blood sugar    since childhood, controlled by diet   Mitral regurgitation    Mitral valve prolapse    Osteopenia 2021   Paroxysmal  atrial fibrillation (HCC)    Seizure-like activity (HCC)    Right gaze preference at baseline, essential termor at baseline, intermittent reduced responsiveness secondary to toxic/metabolic processes and poor cognitive reserve   Tremor      Significant Hospital Events: Including procedures, antibiotic start and stop dates in addition to other pertinent events   8/20 admitted to [facility] w/ PEG tube malposition need for replacement and severe electrolyte abnormality; pccm consulted for vent management  Interim History / Subjective:  See above  Objective   Blood pressure 92/74, pulse (!) 118, temperature 98.7 F (37.1 C), temperature source Oral, resp. rate (!) 26, height 5\' 10"  (1.778 m), weight 52 kg, SpO2 96%.    FiO2 (%):  [40 %] 40 % Set Rate:  [26 bmp] 26 bmp PEEP:  [8 cmH20] 8 cmH20 Plateau Pressure:  [28 cmH20] 28 cmH20  No intake or output data in the 24 hours ending 09/09/22 1709 Filed Weights   09/09/22 1301  Weight: 52 kg    Examination: General: chronic trach patient on mech vent,  HEENT: MM pink/moist; trach in place, copious secretions Neuro: Alert; does not follow commands, essential tremor noted CV: s1s2, tachy 110s, no m/r/g PULM:  dim clear BS bilaterally; trached on mech vent  GI: soft, PEG in place, erythema (possible candidal rash) noted and dried blood on abdomen Extremities: contractured, no edema Skin: no rashes or lesions    Resolved Hospital Problem list     Assessment & Plan:  Chronic hypoxic/hypercapnic respiratory failure s/p trach/vent dependent Plan: - maintain  vent support. He is at base baseline vent settings - routine trach care  Hypokalemia Hypomagnesemia Hyponatremia Plan: -replete electrolytes, he has already received potassium 40 meq IV and 2g mag - will add enteral potassium replacement as well  - IVF resuscitation  Best Practice (right click and "Reselect all SmartList Selections" daily)   Per primary  Labs   CBC: Recent  Labs  Lab 09/09/22 1417 09/09/22 1653  WBC 13.4*  --   NEUTROABS 12.1*  --   HGB 8.1* 9.2*  HCT 28.1* 27.0*  MCV 86.2  --   PLT 355  --     Basic Metabolic Panel: Recent Labs  Lab 09/09/22 1326 09/09/22 1653  NA 134* 133*  K 2.4* 3.0*  CL 78*  --   CO2 >45*  --   GLUCOSE 112*  --   BUN <5*  --   CREATININE 0.43*  --   CALCIUM 8.1*  --   MG 1.5*  --    GFR: Estimated Creatinine Clearance: 53.3 mL/min (A) (by C-G formula based on SCr of 0.43 mg/dL (L)). Recent Labs  Lab 09/09/22 1417  WBC 13.4*    Liver Function Tests: No results for input(s): "AST", "ALT", "ALKPHOS", "BILITOT", "PROT", "ALBUMIN" in the last 168 hours. No results for input(s): "LIPASE", "AMYLASE" in the last 168 hours. No results for input(s): "AMMONIA" in the last 168 hours.  ABG    Component Value Date/Time   PHART 7.452 (H) 07/04/2022 0935   PCO2ART 59.7 (H) 07/04/2022 0935   PO2ART 115 (H) 07/04/2022 0935   HCO3 53.7 (H) 09/09/2022 1653   TCO2 >50 (H) 09/09/2022 1653   ACIDBASEDEF 2.0 05/30/2021 0052   O2SAT 99 09/09/2022 1653     Coagulation Profile: No results for input(s): "INR", "PROTIME" in the last 168 hours.  Cardiac Enzymes: No results for input(s): "CKTOTAL", "CKMB", "CKMBINDEX", "TROPONINI" in the last 168 hours.  HbA1C: Hgb A1c MFr Bld  Date/Time Value Ref Range Status  01/04/2022 11:22 PM 5.3 4.8 - 5.6 % Final    Comment:    (NOTE)         Prediabetes: 5.7 - 6.4         Diabetes: >6.4         Glycemic control for adults with diabetes: <7.0   04/11/2021 09:12 PM 5.5 4.8 - 5.6 % Final    Comment:    (NOTE) Pre diabetes:          5.7%-6.4%  Diabetes:              >6.4%  Glycemic control for   <7.0% adults with diabetes     CBG: No results for input(s): "GLUCAP" in the last 168 hours.  Review of Systems:   Patient is trached on vent; therefore, history has been obtained from chart review.    Past Medical History:  He,  has a past medical history of CHF  (congestive heart failure) (HCC), Coronary artery disease, Essential tremor, GERD (gastroesophageal reflux disease), GI bleed (2019), Headache, Hypothyroidism, Low blood sugar, Mitral regurgitation, Mitral valve prolapse, Osteopenia (2021), Paroxysmal atrial fibrillation (HCC), Seizure-like activity (HCC), and Tremor.   Surgical History:   Past Surgical History:  Procedure Laterality Date   COLONOSCOPY WITH PROPOFOL N/A 02/19/2017   Procedure: COLONOSCOPY WITH PROPOFOL;  Surgeon: Charlott Rakes, MD;  Location: [facility] ENDOSCOPY;  Service: Endoscopy;  Laterality: N/A;   IR GASTROSTOMY TUBE MOD SED  10/18/2020   IR IVC FILTER PLMT / S&I Lenise Arena GUID/MOD SED  11/01/2020   IR REPLACE G-TUBE SIMPLE WO FLUORO  03/15/2022   IR REPLACE G-TUBE SIMPLE WO FLUORO  06/30/2022   IR REPLACE G-TUBE SIMPLE WO FLUORO  09/09/2022   laser eye surgery for retina detachment     MITRAL VALVE REPAIR  01/2003   monitor  02/05/2006   polyp removal     TONSILLECTOMY     tooth removal     as a teenager   TRACHEOSTOMY TUBE PLACEMENT N/A 10/26/2020   Procedure: TRACHEOSTOMY;  Surgeon: Drema Halon, MD;  Location: [facility] OR;  Service: ENT;  Laterality: N/A;     Social History:   reports that he has never smoked. He has never used smokeless tobacco. He reports current alcohol use. He reports that he does not use drugs.   Family History:  His family history includes Asthma in his daughter; CAD in his mother; Epilepsy in his son; Heart disease in his mother; Heart failure in his father, maternal grandmother, and paternal grandmother; Migraines in his mother; Pneumonia in his maternal grandfather; Skin cancer in his father; Stroke in his paternal grandfather; Tremor in his father; Valvular heart disease in his father. There is no history of Parkinson's disease.   Allergies Allergies  Allergen Reactions   Prednisone Other (See Comments)    Documented on MAR Unknown reaction   Cortisone Other (See Comments)    Documented on  MAR Unknown reaction     Home Medications  Prior to Admission medications   Medication Sig Start Date End Date Taking? Authorizing Provider  acetaminophen (TYLENOL) 325 MG tablet Place 650 mg into feeding tube every 8 (eight) hours as needed for fever, headache or moderate pain.   Yes [provider]  amiodarone (PACERONE) 200 MG tablet Place 1 tablet (200 mg total) into feeding tube daily. 07/05/22  Yes Simonne Martinet, NP  amLODipine (NORVASC) 5 MG tablet Place 5 mg into feeding tube daily.   Yes [provider]  budesonide (PULMICORT) 0.5 MG/2ML nebulizer solution Take 0.5 mg by nebulization every 12 (twelve) hours.   Yes [provider]  folic acid (FOLVITE) 1 MG tablet Take 1 tablet (1 mg total) by mouth daily. Patient taking differently: Place 1 mg into feeding tube daily. 09/29/20  Yes Azucena Fallen, MD  gabapentin (NEURONTIN) 100 MG capsule 200 mg See admin instructions. 200 mg via tube every 12 hours.   Yes [provider]  labetalol (NORMODYNE) 5 MG/ML injection Inject 5 mg into the vein every 2 (two) hours as needed (I10 - Essential (primary) hypertension (no indication provided on [facility])).   Yes [provider]  lansoprazole (PREVACID) 30 MG capsule Place 30 mg into feeding tube daily at 12 noon.   Yes [provider]  levalbuterol (XOPENEX) 1.25 MG/3ML nebulizer solution Take 1.25 mg by nebulization 4 (four) times daily.   Yes [provider]  levothyroxine (SYNTHROID) 25 MCG tablet Place 25 mcg into feeding tube daily before breakfast.   Yes [provider]  LORazepam (ATIVAN) 1 MG tablet Take 1 mg by mouth every 8 (eight) hours as needed for anxiety.   Yes [provider]  Multiple Vitamin (MULTIVITAMIN) tablet Place 1 tablet into feeding tube daily.   Yes [provider]  Omega-3 Fatty Acids (FISH OIL) 1000 MG CAPS Place 1,000 mg into feeding tube daily.   Yes [provider]   polyethylene glycol (MIRALAX / GLYCOLAX) 17 g packet Place 17 g into feeding tube daily as needed (constipation).  Yes [provider]  sertraline (ZOLOFT) 100 MG tablet Place 100 mg into feeding tube daily.   Yes [provider]  sodium chloride (OCEAN) 0.65 % SOLN nasal spray Place 1 spray into both nostrils every 8 (eight) hours.   Yes [provider]     Critical care time: 45 minutes    The patient is critically ill due to hypokalemia, vent management.  Critical care was necessary to treat or prevent imminent or life-threatening deterioration.  Critical care was time spent personally by me on the following activities: development of treatment plan with patient and/or surrogate as well as nursing, discussions with consultants, evaluation of patient's response to treatment, examination of patient, obtaining history from patient or surrogate, ordering and performing treatments and interventions, ordering and review of laboratory studies, ordering and review of radiographic studies, pulse oximetry, re-evaluation of patient's condition and participation in multidisciplinary rounds.   Critical Care Time devoted to patient care services described in this note is 45 minutes. This time reflects time of care of this signee Charlott Holler . This critical care time does not reflect separately billable procedures or procedure time, teaching time or supervisory time of PA/NP/Med student/Med Resident etc but could involve care discussion time.       Mickel Baas Pulmonary and Critical Care Medicine 09/09/2022 6:07 PM  Pager: see AMION  If no response to pager , please call critical care on call (see AMION) until 7pm After 7:00 pm call Elink

## 2022-09-09 NOTE — ED Notes (Signed)
ED TO INPATIENT HANDOFF REPORT  ED Nurse Name and Phone #: [phone]  S Name/Age/Gender Austin Davis 82 y.o. male Room/Bed: 009C/009C  Code Status   Code Status: Full Code  Home/SNF/Other Skilled nursing facility Patient oriented to: self Is this baseline? Yes   Triage Complete: Triage complete  Chief Complaint Hypokalemia [E87.6]  Triage Note No notes on file   Allergies Allergies  Allergen Reactions   Prednisone Other (See Comments)    Documented on MAR Unknown reaction   Cortisone Other (See Comments)    Documented on MAR Unknown reaction    Level of Care/Admitting Diagnosis ED Disposition     ED Disposition  Admit   Condition  --   Comment  Hospital Area: [facility] [[id number]]  Level of Care: Progressive [102]  Admit to Progressive based on following criteria: MULTISYSTEM THREATS such as stable sepsis, metabolic/electrolyte imbalance with or without encephalopathy that is responding to early treatment.  May place patient in observation at [facility] or [facility] if equivalent level of care is available:: No  Covid Evaluation: Asymptomatic - no recent exposure (last 10 days) testing not required  Diagnosis: Hypokalemia [981191]  Admitting Physician: Ginnie Smart [[id number]  Attending Physician: HATCHER, JEFFREY C [[id number]]          B Medical/Surgery History Past Medical History:  Diagnosis Date   Aspiration pneumonia (HCC) 03/25/2021   CHF (congestive heart failure) (HCC)    Coronary artery disease    Essential tremor    GERD (gastroesophageal reflux disease)    GI bleed 2019   hospitalized at [facility] for one week   Headache    since childhood   Hypothyroidism    Low blood sugar    since childhood, controlled by diet   Mitral regurgitation    Mitral valve prolapse    Osteopenia 2021   Paroxysmal atrial fibrillation (HCC)    Seizure-like activity (HCC)    Right gaze preference at baseline, essential termor at  baseline, intermittent reduced responsiveness secondary to toxic/metabolic processes and poor cognitive reserve   Tremor    Past Surgical History:  Procedure Laterality Date   COLONOSCOPY WITH PROPOFOL N/A 02/19/2017   Procedure: COLONOSCOPY WITH PROPOFOL;  Surgeon: Charlott Rakes, MD;  Location: [facility];  Service: Endoscopy;  Laterality: N/A;   IR GASTROSTOMY TUBE MOD SED  10/18/2020   IR IVC FILTER PLMT / S&I /IMG GUID/MOD SED  11/01/2020   IR REPLACE G-TUBE SIMPLE WO FLUORO  03/15/2022   IR REPLACE G-TUBE SIMPLE WO FLUORO  06/30/2022   IR REPLACE G-TUBE SIMPLE WO FLUORO  09/09/2022   laser eye surgery for retina detachment     MITRAL VALVE REPAIR  01/2003   monitor  02/05/2006   polyp removal     TONSILLECTOMY     tooth removal     as a teenager   TRACHEOSTOMY TUBE PLACEMENT N/A 10/26/2020   Procedure: TRACHEOSTOMY;  Surgeon: Drema Halon, MD;  Location: [facility] OR;  Service: ENT;  Laterality: N/A;     A IV Location/Drains/Wounds Patient Lines/Drains/Airways Status     Active Line/Drains/Airways     Name Placement date Placement time Site Days   Peripheral IV 07/01/22 20 G Anterior;Left;Proximal Forearm 07/01/22  2230  Forearm  70   Peripheral IV 07/01/22 20 G Posterior;Right Wrist 07/01/22  2322  Wrist  70   Peripheral IV 09/09/22 20 G Left;Posterior Hand 09/09/22  1303  Hand  less than 1   Peripheral  IV 09/09/22 20 G Anterior;Distal;Left;Upper Arm 09/09/22  1344  Arm  less than 1   Gastrostomy/Enterostomy Gastrostomy 24 Fr. 03/15/22  1010  --  178   Gastrostomy/Enterostomy Gastrostomy 24 Fr. LUQ 09/09/22  1510  LUQ  less than 1   External Urinary Catheter 06/30/22  0800  --  71   Fecal Management System 45 mL 07/01/22  1552  -- 70   Tracheostomy Shiley XLT Distal 6 mm Cuffed;Distal --  --  6 mm  --   Tracheostomy Other (Comment) 7 mm Cuffed 09/09/22  --  7 mm  less than 1   Pressure Injury 02/01/22 Buttocks Left Stage 2 -  Partial thickness loss of dermis presenting  as a shallow open injury with a red, pink wound bed without slough. 02/01/22  0640  -- 220   Pressure Injury 06/30/22 Coccyx Mid;Right Stage 1 -  Intact skin with non-blanchable redness of a localized area usually over a bony prominence. pink 06/30/22  0800  -- 71   Wound / Incision (Open or Dehisced) 07/01/22 Incision - Open Groin Anterior;Proximal;Right 07/01/22  1701  Groin  70            Intake/Output Last 24 hours No intake or output data in the 24 hours ending 09/09/22 1903  Labs/Imaging Results for orders placed or performed during the hospital encounter of 09/09/22 (from the past 48 hour(s))  Basic metabolic panel     Status: Abnormal   Collection Time: 09/09/22  1:26 PM  Result Value Ref Range   Sodium 134 (L) 135 - 145 mmol/L   Potassium 2.4 (LL) 3.5 - 5.1 mmol/L    Comment: CRITICAL RESULT CALLED TO, READ BACK BY AND VERIFIED WITH S.Saachi Zale RN 1454 09/09/2022 BY G.GANADEN   Chloride 78 (L) 98 - 111 mmol/L   CO2 >45 (H) 22 - 32 mmol/L   Glucose, Bld 112 (H) 70 - 99 mg/dL    Comment: Glucose reference range applies only to samples taken after fasting for at least 8 hours.   BUN <5 (L) 8 - 23 mg/dL   Creatinine, Ser 1.30 (L) 0.61 - 1.24 mg/dL   Calcium 8.1 (L) 8.9 - 10.3 mg/dL   GFR, Estimated >86 >57 mL/min    Comment: (NOTE) Calculated using the CKD-EPI Creatinine Equation (2021)    Anion gap NOT CALCULATED 5 - 15    Comment: ELECTROLYTES REPEATED TO VERIFY Performed at [facility], [address] [place], [place] [address]   Magnesium     Status: Abnormal   Collection Time: 09/09/22  1:26 PM  Result Value Ref Range   Magnesium 1.5 (L) 1.7 - 2.4 mg/dL    Comment: Performed at [facility], [address], [place], [place] [address]  CBC with Differential/Platelet     Status: Abnormal   Collection Time: 09/09/22  2:17 PM  Result Value Ref Range   WBC 13.4 (H) 4.0 - 10.5 K/uL   RBC 3.26 (L) 4.22 - 5.81 MIL/uL   Hemoglobin 8.1 (L) 13.0 - 17.0 g/dL    HCT 41.3 (L) 24.4 - 52.0 %   MCV 86.2 80.0 - 100.0 fL   MCH 24.8 (L) 26.0 - 34.0 pg   MCHC 28.8 (L) 30.0 - 36.0 g/dL   RDW 01.0 (H) 27.2 - 53.6 %   Platelets 355 150 - 400 K/uL   nRBC 0.0 0.0 - 0.2 %   Neutrophils Relative % 91 %   Neutro Abs 12.1 (H) 1.7 -  7.7 K/uL   Lymphocytes Relative 2 %   Lymphs Abs 0.3 (L) 0.7 - 4.0 K/uL   Monocytes Relative 5 %   Monocytes Absolute 0.7 0.1 - 1.0 K/uL   Eosinophils Relative 1 %   Eosinophils Absolute 0.1 0.0 - 0.5 K/uL   Basophils Relative 0 %   Basophils Absolute 0.0 0.0 - 0.1 K/uL   Immature Granulocytes 1 %   Abs Immature Granulocytes 0.10 (H) 0.00 - 0.07 K/uL    Comment: Performed at [facility], [address] [place], [place] [address]  I-Stat venous blood gas, [facility], [facility], [facility])     Status: Abnormal   Collection Time: 09/09/22  4:53 PM  Result Value Ref Range   pH, Ven 7.575 (H) 7.25 - 7.43   pCO2, Ven 58.0 44 - 60 mmHg   pO2, Ven 110 (H) 32 - 45 mmHg   Bicarbonate 53.7 (H) 20.0 - 28.0 mmol/L   TCO2 >50 (H) 22 - 32 mmol/L   O2 Saturation 99 %   Acid-Base Excess 28.0 (H) 0.0 - 2.0 mmol/L   Sodium 133 (L) 135 - 145 mmol/L   Potassium 3.0 (L) 3.5 - 5.1 mmol/L   Calcium, Ion 1.03 (L) 1.15 - 1.40 mmol/L   HCT 27.0 (L) 39.0 - 52.0 %   Hemoglobin 9.2 (L) 13.0 - 17.0 g/dL   Sample type VENOUS    DG ABDOMEN PEG TUBE LOCATION  Result Date: 09/09/2022 CLINICAL DATA:  82 year old male with displaced gastrostomy. He presents for a replacement. EXAM: ABDOMEN - 1 VIEW COMPARISON:  Chest x-ray 07/01/2022, abdominal film 06/30/2022 FINDINGS: Limited plain film of the abdomen. Percutaneous gastrostomy terminates within the stomach lumen with contrast partially opacifying the stomach. Contrast additionally within: Extending to the rectum. No abnormal distention of small bowel or colon. IVC filter in place. IMPRESSION: Percutaneous gastrostomy confirmed within the stomach lumen. Electronically Signed   By: Gilmer Mor D.O.   On: 09/09/2022  16:19   IR REPLACE G-TUBE SIMPLE WO FLUORO  Result Date: 09/09/2022 INDICATION: 82 year old male with accidental displacement of percutaneous gastrostomy. EXAM: IR TUBE PLACEMENT/REPLACEMENT/CHECK MEDICATIONS: None ANESTHESIA/SEDATION: None CONTRAST:  None FLUOROSCOPY: Radiation Exposure Index (as provided by the fluoroscopic device): 0 mGy Kerma COMPLICATIONS: None PROCEDURE: Informed written consent was obtained from the patient and the patient's family after a thorough discussion of the procedural risks, benefits and alternatives. A timeout as performed prior to the initiation of the procedure. The epigastrium was exposed, identifying the patent ostomy into the stomach lumen. The prior gastrostomy have been completely removed. There was some discharge of gastric contents through the ostomy. A new 24 French balloon retention gastrostomy was placed through the ostomy. The balloon was inflated and the flange was tightened. Contrast was injected, which confirmed location in the stomach. . Patient tolerated the procedure well and remained hemodynamically stable throughout. No complications were encountered and no significant blood loss encountered. IMPRESSION: Bedside rescue of a displaced gastrostomy, with placement of a new 24 French balloon retention gastrostomy. Electronically Signed   By: Gilmer Mor D.O.   On: 09/09/2022 16:17    Pending Labs Unresulted Labs (From admission, onward)     Start     Ordered   09/10/22 0500  Basic metabolic panel  Tomorrow morning,   R        09/09/22 1825   09/10/22 0500  CBC  Tomorrow morning,   R        09/09/22 1825   09/09/22 1845  Potassium  Now then every 6 hours,   R (with TIMED occurrences)      09/09/22 1844   09/09/22 1302  CBC with Differential  Once,   STAT        09/09/22 1301            Vitals/Pain Today's Vitals   09/09/22 1600 09/09/22 1630 09/09/22 1651 09/09/22 1815  BP: 98/61 92/74  96/71  Pulse: (!) 105 (!) 118  100  Resp: (!) 26  (!) 26  (!) 26  Temp:   98.7 F (37.1 C) 98.1 F (36.7 C)  TempSrc:   Oral Oral  SpO2: 95% 96%  94%  Weight:      Height:      PainSc:        Isolation Precautions No active isolations  Medications Medications  lactated ringers bolus 500 mL (500 mLs Intravenous Not Given 09/09/22 1637)  potassium chloride 10 mEq in 100 mL IVPB (10 mEq Intravenous New Bag/Given 09/09/22 1752)  potassium chloride (KLOR-CON) packet 40 mEq (has no administration in time range)  nystatin cream (MYCOSTATIN) (has no administration in time range)  amiodarone (PACERONE) tablet 200 mg (has no administration in time range)  folic acid (FOLVITE) tablet 1 mg (has no administration in time range)  sertraline (ZOLOFT) tablet 100 mg (has no administration in time range)  levothyroxine (SYNTHROID) tablet 25 mcg (has no administration in time range)  amLODipine (NORVASC) tablet 5 mg (has no administration in time range)  budesonide (PULMICORT) nebulizer solution 0.5 mg (has no administration in time range)  polyethylene glycol (MIRALAX / GLYCOLAX) packet 17 g (has no administration in time range)  potassium chloride (KLOR-CON) packet 40 mEq (has no administration in time range)  gabapentin (NEURONTIN) 250 MG/5ML solution 200 mg (has no administration in time range)  diatrizoate meglumine-sodium (GASTROGRAFIN) 66-10 % solution 30 mL (30 mLs Per Tube Given 09/09/22 1534)  magnesium sulfate IVPB 2 g 50 mL (0 g Intravenous Stopped 09/09/22 1742)  lactated ringers bolus 1,000 mL (1,000 mLs Intravenous New Bag/Given 09/09/22 1636)    Mobility non-ambulatory     Focused Assessment. General. Electrolyte imbalance   R Recommendations: See Admitting Provider Note  Report given to:   Additional Notes:

## 2022-09-09 NOTE — ED Provider Notes (Signed)
  Physical Exam  BP 92/74   Pulse (!) 118   Temp 98.7 F (37.1 C) (Oral)   Resp (!) 26   Ht 5\' 10"  (1.778 m)   Wt 52 kg   SpO2 96%   BMI 16.45 kg/m   Physical Exam  Procedures  Procedures  ED Course / MDM   Clinical Course as of 09/09/22 1729  Tue Sep 09, 2022  1339 BP(!): 172/56 [JB]    Clinical Course User Index [JB] Barrett, Horald Chestnut, PA-C   Medical Decision Making Care assumed at 4 PM.  Patient is vent dependent and has a PEG tube that fell out yesterday.  Patient is from [facility] and they have been getting labs and his potassium is getting progressively lower.  Patient had an IR guided G-tube replacement that was confirmed by x-ray.  However his labs show potassium of 2.4 and CO2 is greater than 45.  Patient is alkalotic as well.  Patient also had prolonged QTc of 515. Consulted critical care who saw the patient.  They state that since patient does not have any vent setting changes, they will consult and recommend hospitalist admission for aggressive electrolyte replacement before patient can be discharged back to [facility]  CRITICAL CARE Performed by: Richardean Canal   Total critical care time: 32 minutes  Critical care time was exclusive of separately billable procedures and treating other patients.  Critical care was necessary to treat or prevent imminent or life-threatening deterioration.  Critical care was time spent personally by me on the following activities: development of treatment plan with patient and/or surrogate as well as nursing, discussions with consultants, evaluation of patient's response to treatment, examination of patient, obtaining history from patient or surrogate, ordering and performing treatments and interventions, ordering and review of laboratory studies, ordering and review of radiographic studies, pulse oximetry and re-evaluation of patient's condition.   Problems Addressed: Gastrojejunostomy tube dislodgement: acute illness or  injury Hypokalemia: acute illness or injury  Amount and/or Complexity of Data Reviewed Labs: ordered. Radiology: ordered and independent interpretation performed. Decision-making details documented in ED Course.  Risk Prescription drug management. Decision regarding hospitalization.          Charlynne Pander, MD 09/09/22 [phone]

## 2022-09-10 ENCOUNTER — Other Ambulatory Visit (HOSPITAL_COMMUNITY): Payer: Self-pay

## 2022-09-10 DIAGNOSIS — E876 Hypokalemia: Secondary | ICD-10-CM | POA: Diagnosis not present

## 2022-09-10 DIAGNOSIS — T85528A Displacement of other gastrointestinal prosthetic devices, implants and grafts, initial encounter: Secondary | ICD-10-CM | POA: Diagnosis not present

## 2022-09-10 DIAGNOSIS — Z9911 Dependence on respirator [ventilator] status: Secondary | ICD-10-CM | POA: Diagnosis not present

## 2022-09-10 LAB — CBC WITH DIFFERENTIAL/PLATELET
Abs Immature Granulocytes: 0.06 10*3/uL (ref 0.00–0.07)
Basophils Absolute: 0 10*3/uL (ref 0.0–0.1)
Basophils Relative: 0 %
Eosinophils Absolute: 0.1 10*3/uL (ref 0.0–0.5)
Eosinophils Relative: 1 %
HCT: 26.4 % — ABNORMAL LOW (ref 39.0–52.0)
Hemoglobin: 7.9 g/dL — ABNORMAL LOW (ref 13.0–17.0)
Immature Granulocytes: 1 %
Lymphocytes Relative: 6 %
Lymphs Abs: 0.6 10*3/uL — ABNORMAL LOW (ref 0.7–4.0)
MCH: 25 pg — ABNORMAL LOW (ref 26.0–34.0)
MCHC: 29.9 g/dL — ABNORMAL LOW (ref 30.0–36.0)
MCV: 83.5 fL (ref 80.0–100.0)
Monocytes Absolute: 0.7 10*3/uL (ref 0.1–1.0)
Monocytes Relative: 7 %
Neutro Abs: 8.4 10*3/uL — ABNORMAL HIGH (ref 1.7–7.7)
Neutrophils Relative %: 85 %
Platelets: 399 10*3/uL (ref 150–400)
RBC: 3.16 MIL/uL — ABNORMAL LOW (ref 4.22–5.81)
RDW: 20 % — ABNORMAL HIGH (ref 11.5–15.5)
WBC: 9.9 10*3/uL (ref 4.0–10.5)
nRBC: 0 % (ref 0.0–0.2)

## 2022-09-10 LAB — GLUCOSE, CAPILLARY
Glucose-Capillary: 55 mg/dL — ABNORMAL LOW (ref 70–99)
Glucose-Capillary: 84 mg/dL (ref 70–99)
Glucose-Capillary: 170 mg/dL — ABNORMAL HIGH (ref 70–99)
Glucose-Capillary: 50 mg/dL — ABNORMAL LOW (ref 70–99)
Glucose-Capillary: 102 mg/dL — ABNORMAL HIGH (ref 70–99)

## 2022-09-10 LAB — BASIC METABOLIC PANEL
Anion gap: 16 — ABNORMAL HIGH (ref 5–15)
BUN: 8 mg/dL (ref 8–23)
CO2: 36 mmol/L — ABNORMAL HIGH (ref 22–32)
Calcium: 8.2 mg/dL — ABNORMAL LOW (ref 8.9–10.3)
Chloride: 83 mmol/L — ABNORMAL LOW (ref 98–111)
Creatinine, Ser: 0.49 mg/dL — ABNORMAL LOW (ref 0.61–1.24)
GFR, Estimated: >60 mL/min (ref >60)
Glucose, Bld: 53 mg/dL — ABNORMAL LOW (ref 70–99)
Potassium: 3.4 mmol/L — ABNORMAL LOW (ref 3.5–5.1)
Sodium: 135 mmol/L (ref 135–145)

## 2022-09-10 LAB — MAGNESIUM: Magnesium: 2.5 mg/dL — ABNORMAL HIGH (ref 1.7–2.4)

## 2022-09-10 LAB — MRSA NEXT GEN BY PCR, NASAL: MRSA by PCR Next Gen: NOT DETECTED

## 2022-09-10 MED ORDER — ORAL CARE MOUTH RINSE: 15.0000 mL | OROMUCOSAL | Status: DC | PRN

## 2022-09-10 MED ORDER — DEXTROSE 50 % IV SOLN
INTRAVENOUS | Status: AC
  Filled 2022-09-10: qty 50

## 2022-09-10 MED ORDER — DEXTROSE 50 % IV SOLN
25.0000 g | INTRAVENOUS | Status: AC
  Administered 2022-09-10: 25 g via INTRAVENOUS

## 2022-09-10 MED ORDER — ORAL CARE MOUTH RINSE
15.0000 mL | OROMUCOSAL | Status: DC
  Administered 2022-09-10 (×7): 15 mL via OROMUCOSAL

## 2022-09-10 MED ORDER — DEXTROSE 50 % IV SOLN: 12.5000 g | INTRAVENOUS | Status: AC

## 2022-09-10 MED ORDER — CEPHALEXIN 500 MG PO CAPS
500.0000 mg | ORAL_CAPSULE | Freq: Two times a day (BID) | ORAL | 0 refills | Status: AC
  Filled 2022-09-10: qty 10, 5d supply, fill #0

## 2022-09-10 MED ORDER — OSMOLITE 1.5 CAL PO LIQD
1000.0000 mL | ORAL | Status: DC
  Administered 2022-09-10: 1000 mL

## 2022-09-10 MED ORDER — OSMOLITE 1.5 CAL PO LIQD: 1000.0000 mL | ORAL | 0 refills | Status: DC

## 2022-09-10 MED ORDER — DEXTROSE 50 % IV SOLN
INTRAVENOUS | Status: AC
  Administered 2022-09-10: 25 mL via INTRAVENOUS
  Filled 2022-09-10: qty 50

## 2022-09-10 MED ORDER — OSMOLITE 1.5 CAL PO LIQD
60.0000 mL | ORAL | Status: DC
  Administered 2022-09-10: 1000 mL
  Filled 2022-09-10: qty 237

## 2022-09-10 MED ORDER — CHLORHEXIDINE GLUCONATE CLOTH 2 % EX PADS
6.0000 | MEDICATED_PAD | Freq: Every day | CUTANEOUS | Status: DC
  Administered 2022-09-10: 6 via TOPICAL

## 2022-09-10 NOTE — Progress Notes (Signed)
Pt received from ED on vent with settings PC 30/26/+8/40%, report from Aimee, RRT.

## 2022-09-10 NOTE — Discharge Instructions (Signed)
Was a pleasure taking care of you.  You were admitted because your PEG tube was dislodged.  It was fixed by interventional radiology and is not working well.  I have prescribed 5-day course of antibiotic for the redness around your PEG tube site.

## 2022-09-10 NOTE — Progress Notes (Signed)
Patient report called to [facility] RN for room 304 at facility. [facility] called and transport set up.

## 2022-09-10 NOTE — ED Notes (Signed)
ED TO INPATIENT HANDOFF REPORT  ED Nurse Name and Phone #: Fae Pippin [phone]  S Name/Age/Gender Austin Davis 82 y.o. male Room/Bed: 009C/009C  Code Status   Code Status: Full Code  Home/SNF/Other Skilled nursing facility Patient oriented to: self, place, time, and situation Is this baseline? Yes   Triage Complete: Triage complete  Chief Complaint Hypokalemia [E87.6]  Triage Note No notes on file   Allergies Allergies  Allergen Reactions   Prednisone Other (See Comments)    Documented on MAR Unknown reaction   Cortisone Other (See Comments)    Documented on MAR Unknown reaction    Level of Care/Admitting Diagnosis ED Disposition     ED Disposition  Admit   Condition  --   Comment  Hospital Area: [facility] [[id number]]  Level of Care: Progressive [102]  Admit to Progressive based on following criteria: MULTISYSTEM THREATS such as stable sepsis, metabolic/electrolyte imbalance with or without encephalopathy that is responding to early treatment.  May place patient in observation at [facility] or [facility] if equivalent level of care is available:: No  Covid Evaluation: Asymptomatic - no recent exposure (last 10 days) testing not required  Diagnosis: Hypokalemia [454098]  Admitting Physician: Ginnie Smart [[id number]]  Attending Physician: HATCHER, JEFFREY C [[id number]]          B Medical/Surgery History Past Medical History:  Diagnosis Date   Aspiration pneumonia (HCC) 03/25/2021   CHF (congestive heart failure) (HCC)    Coronary artery disease    Essential tremor    GERD (gastroesophageal reflux disease)    GI bleed 2019   hospitalized at [facility] for one week   Headache    since childhood   Hypothyroidism    Low blood sugar    since childhood, controlled by diet   Mitral regurgitation    Mitral valve prolapse    Osteopenia 2021   Paroxysmal atrial fibrillation (HCC)    Seizure-like activity (HCC)    Right gaze  preference at baseline, essential termor at baseline, intermittent reduced responsiveness secondary to toxic/metabolic processes and poor cognitive reserve   Tremor    Past Surgical History:  Procedure Laterality Date   COLONOSCOPY WITH PROPOFOL N/A 02/19/2017   Procedure: COLONOSCOPY WITH PROPOFOL;  Surgeon: Charlott Rakes, MD;  Location: [facility];  Service: Endoscopy;  Laterality: N/A;   IR GASTROSTOMY TUBE MOD SED  10/18/2020   IR IVC FILTER PLMT / S&I /IMG GUID/MOD SED  11/01/2020   IR REPLACE G-TUBE SIMPLE WO FLUORO  03/15/2022   IR REPLACE G-TUBE SIMPLE WO FLUORO  06/30/2022   IR REPLACE G-TUBE SIMPLE WO FLUORO  09/09/2022   laser eye surgery for retina detachment     MITRAL VALVE REPAIR  01/2003   monitor  02/05/2006   polyp removal     TONSILLECTOMY     tooth removal     as a teenager   TRACHEOSTOMY TUBE PLACEMENT N/A 10/26/2020   Procedure: TRACHEOSTOMY;  Surgeon: Drema Halon, MD;  Location: [facility] OR;  Service: ENT;  Laterality: N/A;     A IV Location/Drains/Wounds Patient Lines/Drains/Airways Status     Active Line/Drains/Airways     Name Placement date Placement time Site Days   Peripheral IV 07/01/22 20 G Anterior;Left;Proximal Forearm 07/01/22  2230  Forearm  71   Peripheral IV 07/01/22 20 G Posterior;Right Wrist 07/01/22  2322  Wrist  71   Peripheral IV 09/09/22 20 G Left;Posterior Hand 09/09/22  1303  Hand  1  Peripheral IV 09/09/22 20 G Anterior;Distal;Left;Upper Arm 09/09/22  1344  Arm  1   Gastrostomy/Enterostomy Gastrostomy 24 Fr. 03/15/22  1010  --  179   Gastrostomy/Enterostomy Gastrostomy 24 Fr. LUQ 09/09/22  1510  LUQ  1   External Urinary Catheter 06/30/22  0800  --  72   Fecal Management System 45 mL 07/01/22  1552  -- 71   Tracheostomy Shiley XLT Distal 6 mm Cuffed;Distal --  --  6 mm  --   Tracheostomy Other (Comment) 7 mm Cuffed 09/09/22  --  7 mm  1   Pressure Injury 02/01/22 Buttocks Left Stage 2 -  Partial thickness loss of dermis  presenting as a shallow open injury with a red, pink wound bed without slough. 02/01/22  0640  -- 221   Pressure Injury 06/30/22 Coccyx Mid;Right Stage 1 -  Intact skin with non-blanchable redness of a localized area usually over a bony prominence. pink 06/30/22  0800  -- 72   Wound / Incision (Open or Dehisced) 07/01/22 Incision - Open Groin Anterior;Proximal;Right 07/01/22  1701  Groin  71            Intake/Output Last 24 hours  Intake/Output Summary (Last 24 hours) at 09/10/2022 0006 Last data filed at 09/09/2022 2202 Gross per 24 hour  Intake 150 ml  Output --  Net 150 ml    Labs/Imaging Results for orders placed or performed during the hospital encounter of 09/09/22 (from the past 48 hour(s))  Basic metabolic panel     Status: Abnormal   Collection Time: 09/09/22  1:26 PM  Result Value Ref Range   Sodium 134 (L) 135 - 145 mmol/L   Potassium 2.4 (LL) 3.5 - 5.1 mmol/L    Comment: CRITICAL RESULT CALLED TO, READ BACK BY AND VERIFIED WITH S.PANHWAR RN 1454 09/09/2022 BY G.GANADEN   Chloride 78 (L) 98 - 111 mmol/L   CO2 >45 (H) 22 - 32 mmol/L   Glucose, Bld 112 (H) 70 - 99 mg/dL    Comment: Glucose reference range applies only to samples taken after fasting for at least 8 hours.   BUN <5 (L) 8 - 23 mg/dL   Creatinine, Ser 4.54 (L) 0.61 - 1.24 mg/dL   Calcium 8.1 (L) 8.9 - 10.3 mg/dL   GFR, Estimated >09 >81 mL/min    Comment: (NOTE) Calculated using the CKD-EPI Creatinine Equation (2021)    Anion gap NOT CALCULATED 5 - 15    Comment: ELECTROLYTES REPEATED TO VERIFY Performed at [facility], [address], [place], [place] [address]   Magnesium     Status: Abnormal   Collection Time: 09/09/22  1:26 PM  Result Value Ref Range   Magnesium 1.5 (L) 1.7 - 2.4 mg/dL    Comment: Performed at [facility], [address] [place], [place] [address]  CBC with Differential/Platelet     Status: Abnormal   Collection Time: 09/09/22  2:17 PM  Result Value Ref Range    WBC 13.4 (H) 4.0 - 10.5 K/uL   RBC 3.26 (L) 4.22 - 5.81 MIL/uL   Hemoglobin 8.1 (L) 13.0 - 17.0 g/dL   HCT 21.3 (L) 08.6 - 57.8 %   MCV 86.2 80.0 - 100.0 fL   MCH 24.8 (L) 26.0 - 34.0 pg   MCHC 28.8 (L) 30.0 - 36.0 g/dL   RDW 46.9 (H) 62.9 - 52.8 %   Platelets 355 150 - 400 K/uL   nRBC 0.0 0.0 - 0.2 %  Neutrophils Relative % 91 %   Neutro Abs 12.1 (H) 1.7 - 7.7 K/uL   Lymphocytes Relative 2 %   Lymphs Abs 0.3 (L) 0.7 - 4.0 K/uL   Monocytes Relative 5 %   Monocytes Absolute 0.7 0.1 - 1.0 K/uL   Eosinophils Relative 1 %   Eosinophils Absolute 0.1 0.0 - 0.5 K/uL   Basophils Relative 0 %   Basophils Absolute 0.0 0.0 - 0.1 K/uL   Immature Granulocytes 1 %   Abs Immature Granulocytes 0.10 (H) 0.00 - 0.07 K/uL    Comment: Performed at [facility], [address], [place], [place] [address]  I-Stat venous blood gas, [facility], [facility], [facility])     Status: Abnormal   Collection Time: 09/09/22  4:53 PM  Result Value Ref Range   pH, Ven 7.575 (H) 7.25 - 7.43   pCO2, Ven 58.0 44 - 60 mmHg   pO2, Ven 110 (H) 32 - 45 mmHg   Bicarbonate 53.7 (H) 20.0 - 28.0 mmol/L   TCO2 >50 (H) 22 - 32 mmol/L   O2 Saturation 99 %   Acid-Base Excess 28.0 (H) 0.0 - 2.0 mmol/L   Sodium 133 (L) 135 - 145 mmol/L   Potassium 3.0 (L) 3.5 - 5.1 mmol/L   Calcium, Ion 1.03 (L) 1.15 - 1.40 mmol/L   HCT 27.0 (L) 39.0 - 52.0 %   Hemoglobin 9.2 (L) 13.0 - 17.0 g/dL   Sample type VENOUS   Potassium     Status: None   Collection Time: 09/09/22  8:18 PM  Result Value Ref Range   Potassium 3.5 3.5 - 5.1 mmol/L    Comment: Performed at [facility], [address] [place], [place] [address]   DG ABDOMEN PEG TUBE LOCATION  Result Date: 09/09/2022 CLINICAL DATA:  82 year old male with displaced gastrostomy. He presents for a replacement. EXAM: ABDOMEN - 1 VIEW COMPARISON:  Chest x-ray 07/01/2022, abdominal film 06/30/2022 FINDINGS: Limited plain film of the abdomen. Percutaneous gastrostomy terminates within the  stomach lumen with contrast partially opacifying the stomach. Contrast additionally within: Extending to the rectum. No abnormal distention of small bowel or colon. IVC filter in place. IMPRESSION: Percutaneous gastrostomy confirmed within the stomach lumen. Electronically Signed   By: Gilmer Mor D.O.   On: 09/09/2022 16:19   IR REPLACE G-TUBE SIMPLE WO FLUORO  Result Date: 09/09/2022 INDICATION: 82 year old male with accidental displacement of percutaneous gastrostomy. EXAM: IR TUBE PLACEMENT/REPLACEMENT/CHECK MEDICATIONS: None ANESTHESIA/SEDATION: None CONTRAST:  None FLUOROSCOPY: Radiation Exposure Index (as provided by the fluoroscopic device): 0 mGy Kerma COMPLICATIONS: None PROCEDURE: Informed written consent was obtained from the patient and the patient's family after a thorough discussion of the procedural risks, benefits and alternatives. A timeout as performed prior to the initiation of the procedure. The epigastrium was exposed, identifying the patent ostomy into the stomach lumen. The prior gastrostomy have been completely removed. There was some discharge of gastric contents through the ostomy. A new 24 French balloon retention gastrostomy was placed through the ostomy. The balloon was inflated and the flange was tightened. Contrast was injected, which confirmed location in the stomach. . Patient tolerated the procedure well and remained hemodynamically stable throughout. No complications were encountered and no significant blood loss encountered. IMPRESSION: Bedside rescue of a displaced gastrostomy, with placement of a new 24 French balloon retention gastrostomy. Electronically Signed   By: Gilmer Mor D.O.   On: 09/09/2022 16:17    Pending Labs Unresulted Labs (From admission, onward)  Start     Ordered   09/10/22 0500  Basic metabolic panel  Tomorrow morning,   R        09/09/22 1825   09/10/22 0500  CBC  Tomorrow morning,   R        09/09/22 1825   09/10/22 0500  CBC with  Differential  Tomorrow morning,   R        09/09/22 1904   09/10/22 0500  CBC with Differential  Tomorrow morning,   R        09/09/22 1916   09/09/22 1845  Potassium  Now then every 6 hours,   R (with TIMED occurrences)      09/09/22 1844   09/09/22 1302  CBC with Differential  Once,   STAT        09/09/22 1301            Vitals/Pain Today's Vitals   09/09/22 2245 09/09/22 2300 09/09/22 2315 09/09/22 2330  BP: 101/79 101/82 (!) 110/93 96/84  Pulse: 84 96 (!) 119 85  Resp: (!) 26 (!) 26 (!) 23 (!) 26  Temp:      TempSrc:      SpO2: 99% 100% 100% 98%  Weight:      Height:      PainSc:        Isolation Precautions No active isolations  Medications Medications  lactated ringers bolus 500 mL (500 mLs Intravenous Not Given 09/09/22 1637)  potassium chloride (KLOR-CON) packet 40 mEq (40 mEq Per Tube Not Given 09/09/22 2253)  nystatin cream (MYCOSTATIN) (has no administration in time range)  amiodarone (PACERONE) tablet 200 mg (has no administration in time range)  folic acid (FOLVITE) tablet 1 mg (has no administration in time range)  sertraline (ZOLOFT) tablet 100 mg (has no administration in time range)  levothyroxine (SYNTHROID) tablet 25 mcg (has no administration in time range)  amLODipine (NORVASC) tablet 5 mg (has no administration in time range)  budesonide (PULMICORT) nebulizer solution 0.5 mg (0.5 mg Nebulization Not Given 09/09/22 2138)  polyethylene glycol (MIRALAX / GLYCOLAX) packet 17 g (has no administration in time range)  potassium chloride (KLOR-CON) packet 40 mEq (40 mEq Per Tube Given 09/09/22 2216)  gabapentin (NEURONTIN) 250 MG/5ML solution 200 mg (200 mg Per Tube Given 09/09/22 2217)  potassium chloride 10 mEq in 100 mL IVPB (0 mEq Intravenous Stopped 09/09/22 2010)  diatrizoate meglumine-sodium (GASTROGRAFIN) 66-10 % solution 30 mL (30 mLs Per Tube Given 09/09/22 1534)  magnesium sulfate IVPB 2 g 50 mL (0 g Intravenous Stopped 09/09/22 1742)  lactated  ringers bolus 1,000 mL (1,000 mLs Intravenous New Bag/Given 09/09/22 1636)  magnesium sulfate IVPB 2 g 50 mL (0 g Intravenous Stopped 09/09/22 2202)    Mobility non-ambulatory       R Recommendations: See Admitting Provider Note  Report given to:   Additional Notes:

## 2022-09-10 NOTE — Progress Notes (Signed)
[facility] here to take patient to [facility]. Report given to [facility] team.

## 2022-09-10 NOTE — Progress Notes (Signed)
[facility] Physician-Brief Progress Note Patient Name: Austin Davis DOB: Apr 01, 1940 MRN: [id number]   Date of Service  09/10/2022  HPI/Events of Note  Patient admitted with a dislodged PEG tube and profound hypokalemia, he has a history of chronic respiratory failure s/p tracheostomy.  eICU Interventions  New Patient Evaluation.        Thomasene Lot Torsten Weniger 09/10/2022, 3:32 AM

## 2022-09-10 NOTE — TOC Transition Note (Signed)
Transition of Care [facility]) - CM/SW Discharge Note   Patient Details  Name: Austin Davis MRN: [id number] Date of Birth: 01/18/1941  Transition of Care [facility]) CM/SW Contact:  Tom-Johnson, Hershal Coria, RN Phone Number: 09/10/2022, 1:06 PM   Clinical Narrative:     Patient scheduled for discharge today. CM notified Marylene Land at [facility] of patient's return with acceptance noted. Discharge summary faxed to [facility] with successful receipt noted.  No further TOC needs noted.       Final next level of care: Long Term Acute Care (LTAC) Barriers to Discharge: Barriers Resolved   Patient Goals and CMS Choice CMS Medicare.gov Compare Post Acute Care list provided to:: Legal Guardian Choice offered to / list presented to : NA  Discharge Placement                  Patient to be transferred to facility by: [facility]      Discharge Plan and Services Additional resources added to the After Visit Summary for                  DME Arranged: N/A DME Agency: NA       HH Arranged: NA HH Agency: NA        Social Determinants of Health (SDOH) Interventions SDOH Screenings   Alcohol Screen: Low Risk  (04/12/2021)  Tobacco Use: Low Risk  (06/29/2022)     Readmission Risk Interventions    03/18/2022   11:45 AM 03/03/2022   12:04 PM 02/07/2022   11:58 AM  Readmission Risk Prevention Plan  Transportation Screening Complete Complete Complete  Medication Review Oceanographer) Complete Complete   PCP or Specialist appointment within 3-5 days of discharge Complete Complete   HRI or Home Care Consult Complete Complete   SW Recovery Care/Counseling Consult Complete Complete Complete  Palliative Care Screening Not Applicable Not Applicable   Skilled Nursing Facility Complete Complete Complete

## 2022-09-10 NOTE — Care Management Obs Status (Signed)
MEDICARE OBSERVATION STATUS NOTIFICATION   Patient Details  Name: Austin Davis MRN: [id number] Date of Birth: 19-Apr-1940   Medicare Observation Status Notification Given:  Yes    Tom-Johnson, Hershal Coria, RN 09/10/2022, 1:02 PM

## 2022-09-10 NOTE — Discharge Summary (Signed)
Name: Austin Davis MRN: [id number] DOB: 05-Sep-1940 82 y.o. PCP: Patrecia Pour, MD  Date of Admission: 09/09/2022 12:41 PM Date of Discharge: 09/10/2022 Attending Physician: Dr.  Vernia Buff  Discharge Diagnosis: Principal Problem:   Hypokalemia    Discharge Medications: Allergies as of 09/10/2022       Reactions   Prednisone Other (See Comments)   Documented on MAR Unknown reaction   Cortisone Other (See Comments)   Documented on MAR Unknown reaction        Medication List     TAKE these medications    acetaminophen 325 MG tablet Commonly known as: TYLENOL Place 650 mg into feeding tube every 8 (eight) hours as needed for fever, headache or moderate pain.   amiodarone 200 MG tablet Commonly known as: PACERONE Place 1 tablet (200 mg total) into feeding tube daily.   amLODipine 5 MG tablet Commonly known as: NORVASC Place 5 mg into feeding tube daily.   budesonide 0.5 MG/2ML nebulizer solution Commonly known as: PULMICORT Take 0.5 mg by nebulization every 12 (twelve) hours.   cephALEXin 500 MG capsule Commonly known as: KEFLEX Take 1 capsule (500 mg total) by mouth 2 (two) times daily for 5 days.   Fish Oil 1000 MG Caps Place 1,000 mg into feeding tube daily.   folic acid 1 MG tablet Commonly known as: FOLVITE Take 1 tablet (1 mg total) by mouth daily. What changed: how to take this   gabapentin 100 MG capsule Commonly known as: NEURONTIN 200 mg See admin instructions. 200 mg via tube every 12 hours.   labetalol 5 MG/ML injection Commonly known as: NORMODYNE Inject 5 mg into the vein every 2 (two) hours as needed (I10 - Essential (primary) hypertension (no indication provided on MAR)).   lansoprazole 30 MG capsule Commonly known as: PREVACID Place 30 mg into feeding tube daily at 12 noon.   levalbuterol 1.25 MG/3ML nebulizer solution Commonly known as: XOPENEX Take 1.25 mg by nebulization 4 (four) times daily.    levothyroxine 25 MCG tablet Commonly known as: SYNTHROID Place 25 mcg into feeding tube daily before breakfast.   LORazepam 1 MG tablet Commonly known as: ATIVAN Take 1 mg by mouth every 8 (eight) hours as needed for anxiety.   multivitamin tablet Place 1 tablet into feeding tube daily.   polyethylene glycol 17 g packet Commonly known as: MIRALAX / GLYCOLAX Place 17 g into feeding tube daily as needed (constipation).   sertraline 100 MG tablet Commonly known as: ZOLOFT Place 100 mg into feeding tube daily.   sodium chloride 0.65 % Soln nasal spray Commonly known as: OCEAN Place 1 spray into both nostrils every 8 (eight) hours.        Disposition and follow-up:   Austin Davis was discharged from [facility] in [place] condition.  At the hospital follow up visit please address:  1.  Follow-up:  a.  Erythema was noted around the PEG tube site. No active drainage. - I have prescribed 5-day course of Keflex 500 mg twice daily for 5 days.   b.  Electrolyte derangements, unclear of the etiology. - Please make sure patient is getting enough nutrients through the PEG tube.  4.  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 PEEP of 8, FiO2 40% respiratory rate of 26.     Hypokalemia  Hypomagnesemia Hyponatremia  On presentation, K2.5, Mg 1.2, NA 134. Patient had an IR guided G-tube replacement done on 09/09/2022. Unclear the etiology of electrolyte derangements, likely multifactorial, from PEG tube dislodgment, to inadequate caloric intake. Electrolytes were repleted, and on the day of discharge electrolytes are within normal range.  Erythema around PEG tube  -Continue Keflex 500 mg twice daily for 5 days.   Chronic medical conditions   A-fib -Amiodarone 200 mg   Hypertension -Amlodipine 5 mg   Hypothyroidism -  Synthroid 25 mcg tablet.   GERD -Lansoprazole 30 mg   Asthma Albuterol 1.25 mg/64M nebulizer   Depression - Sertraline 100 mg   Pain management -Tylenol   Constipation Miralax packet 17 g.   Discharge Subjective: Patient evaluated at bedside this AM.  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  HENT: Moist mucous membrane, trach in place copious secretions.   Cardio:Regular rate and rhythm. No murmurs, rubs, or gallops. Pulm:Clear to auscultation bilaterally. Normal work of breathing on room air. Abdomen: Soft, PEG in place, erythema noted around the PEG tube site. No drainage Skin: Contractured, no edema Neuro:Alert and oriented x3. No focal deficit noted. Psych:Pleasant mood and affect.  Pertinent Labs, Studies, and Procedures:     Latest Ref Rng & Units 09/10/2022    5:54 AM 09/09/2022    4:53 PM 09/09/2022    2:17 PM  CBC  WBC 4.0 - 10.5 K/uL 9.9   13.4   Hemoglobin 13.0 - 17.0 g/dL 7.9  9.2  8.1   Hematocrit 39.0 - 52.0 % 26.4  27.0  28.1   Platelets 150 - 400 K/uL 399   355        Latest Ref Rng & Units 09/10/2022    5:54 AM 09/09/2022    8:18 PM 09/09/2022    4:53 PM  CMP  Glucose 70 - 99 mg/dL 53     BUN 8 - 23 mg/dL 8     Creatinine 5.62 - 1.24 mg/dL 1.30     Sodium 865 - 784 mmol/L 135   133   Potassium 3.5 - 5.1 mmol/L 3.4  3.5  3.0   Chloride 98 - 111 mmol/L 83     CO2 22 - 32 mmol/L 36     Calcium 8.9 - 10.3 mg/dL 8.2       DG ABDOMEN PEG TUBE LOCATION  Result Date: 09/09/2022 CLINICAL DATA:  82 year old male with displaced gastrostomy. He presents for a replacement. EXAM: ABDOMEN - 1 VIEW COMPARISON:  Chest x-ray 07/01/2022, abdominal film 06/30/2022 FINDINGS: Limited plain film of the abdomen. Percutaneous gastrostomy terminates within the  stomach lumen with contrast partially opacifying the stomach. Contrast additionally within: Extending to the rectum. No abnormal distention of small bowel or colon. IVC filter in place. IMPRESSION: Percutaneous gastrostomy confirmed within the stomach lumen. Electronically Signed   By: Gilmer Mor D.O.   On: 09/09/2022 16:19   IR REPLACE G-TUBE SIMPLE WO FLUORO  Result Date: 09/09/2022 INDICATION: 82 year old male with accidental displacement of percutaneous gastrostomy. EXAM: IR TUBE PLACEMENT/REPLACEMENT/CHECK MEDICATIONS: None ANESTHESIA/SEDATION: None CONTRAST:  None FLUOROSCOPY: Radiation Exposure Index (as provided by the fluoroscopic device): 0 mGy Kerma COMPLICATIONS: None PROCEDURE: Informed written consent was obtained from the  patient and the patient's family after a thorough discussion of the procedural risks, benefits and alternatives. A timeout as performed prior to the initiation of the procedure. The epigastrium was exposed, identifying the patent ostomy into the stomach lumen. The prior gastrostomy have been completely removed. There was some discharge of gastric contents through the ostomy. A new 24 French balloon retention gastrostomy was placed through the ostomy. The balloon was inflated and the flange was tightened. Contrast was injected, which confirmed location in the stomach. . Patient tolerated the procedure well and remained hemodynamically stable throughout. No complications were encountered and no significant blood loss encountered. IMPRESSION: Bedside rescue of a displaced gastrostomy, with placement of a new 24 French balloon retention gastrostomy. Electronically Signed   By: Gilmer Mor D.O.   On: 09/09/2022 16:17     Discharge Instructions:   Signed:  Laretta Bolster, M.D.  Internal Medicine Resident, PGY-1 [facility]  Pager: [phone] 10:04 AM, 09/10/2022

## 2022-09-10 NOTE — Progress Notes (Signed)
Hypoglycemic Event  CBG: 50  Treatment: D50 50 mL (25 gm)  Symptoms: None  Follow-up CBG: Time:0900 CBG Result:170  Possible Reasons for Event: Inadequate meal intake  Comments/MD notified:   Initial order placed was for 25mL d50 but patient needed full 50mL per standing order of CBG <54, order had already been documented on but d50 50mL was actually given to patient at 0842. Patient was also started on tube feeding and MD made aware with new orders for CBG checks every 4 hours.     Renato Gails

## 2022-09-11 DIAGNOSIS — Z430 Encounter for attention to tracheostomy: Secondary | ICD-10-CM | POA: Diagnosis not present

## 2022-09-11 DIAGNOSIS — L89152 Pressure ulcer of sacral region, stage 2: Secondary | ICD-10-CM | POA: Diagnosis not present

## 2022-09-11 DIAGNOSIS — R109 Unspecified abdominal pain: Secondary | ICD-10-CM | POA: Diagnosis not present

## 2022-09-11 DIAGNOSIS — I4891 Unspecified atrial fibrillation: Secondary | ICD-10-CM | POA: Diagnosis not present

## 2022-09-11 DIAGNOSIS — Z431 Encounter for attention to gastrostomy: Secondary | ICD-10-CM | POA: Diagnosis not present

## 2022-09-11 DIAGNOSIS — S21301A Unspecified open wound of right front wall of thorax with penetration into thoracic cavity, initial encounter: Secondary | ICD-10-CM | POA: Diagnosis not present

## 2022-09-11 DIAGNOSIS — G20C Parkinsonism, unspecified: Secondary | ICD-10-CM | POA: Diagnosis not present

## 2022-09-11 DIAGNOSIS — R918 Other nonspecific abnormal finding of lung field: Secondary | ICD-10-CM | POA: Diagnosis not present

## 2022-09-11 DIAGNOSIS — I10 Essential (primary) hypertension: Secondary | ICD-10-CM | POA: Diagnosis not present

## 2022-09-11 DIAGNOSIS — J9621 Acute and chronic respiratory failure with hypoxia: Secondary | ICD-10-CM | POA: Diagnosis not present

## 2022-09-11 DIAGNOSIS — G40509 Epileptic seizures related to external causes, not intractable, without status epilepticus: Secondary | ICD-10-CM | POA: Diagnosis not present

## 2022-09-11 DIAGNOSIS — R188 Other ascites: Secondary | ICD-10-CM | POA: Diagnosis not present

## 2022-09-11 DIAGNOSIS — Z9911 Dependence on respirator [ventilator] status: Secondary | ICD-10-CM | POA: Diagnosis not present

## 2022-09-11 DIAGNOSIS — R1310 Dysphagia, unspecified: Secondary | ICD-10-CM | POA: Diagnosis not present

## 2022-09-12 DIAGNOSIS — Z430 Encounter for attention to tracheostomy: Secondary | ICD-10-CM | POA: Diagnosis not present

## 2022-09-12 DIAGNOSIS — Z431 Encounter for attention to gastrostomy: Secondary | ICD-10-CM | POA: Diagnosis not present

## 2022-09-12 DIAGNOSIS — I4891 Unspecified atrial fibrillation: Secondary | ICD-10-CM | POA: Diagnosis not present

## 2022-09-12 DIAGNOSIS — G40509 Epileptic seizures related to external causes, not intractable, without status epilepticus: Secondary | ICD-10-CM | POA: Diagnosis not present

## 2022-09-12 DIAGNOSIS — I10 Essential (primary) hypertension: Secondary | ICD-10-CM | POA: Diagnosis not present

## 2022-09-12 DIAGNOSIS — R1310 Dysphagia, unspecified: Secondary | ICD-10-CM | POA: Diagnosis not present

## 2022-09-12 DIAGNOSIS — Z4659 Encounter for fitting and adjustment of other gastrointestinal appliance and device: Secondary | ICD-10-CM | POA: Diagnosis not present

## 2022-09-12 DIAGNOSIS — R109 Unspecified abdominal pain: Secondary | ICD-10-CM | POA: Diagnosis not present

## 2022-09-16 DIAGNOSIS — G40509 Epileptic seizures related to external causes, not intractable, without status epilepticus: Secondary | ICD-10-CM | POA: Diagnosis not present

## 2022-09-16 DIAGNOSIS — Z430 Encounter for attention to tracheostomy: Secondary | ICD-10-CM | POA: Diagnosis not present

## 2022-09-16 DIAGNOSIS — Z431 Encounter for attention to gastrostomy: Secondary | ICD-10-CM | POA: Diagnosis not present

## 2022-09-16 DIAGNOSIS — I10 Essential (primary) hypertension: Secondary | ICD-10-CM | POA: Diagnosis not present

## 2022-09-16 DIAGNOSIS — I4891 Unspecified atrial fibrillation: Secondary | ICD-10-CM | POA: Diagnosis not present

## 2022-09-16 DIAGNOSIS — R1310 Dysphagia, unspecified: Secondary | ICD-10-CM | POA: Diagnosis not present

## 2022-09-17 DIAGNOSIS — J9621 Acute and chronic respiratory failure with hypoxia: Secondary | ICD-10-CM | POA: Diagnosis not present

## 2022-09-17 DIAGNOSIS — Z431 Encounter for attention to gastrostomy: Secondary | ICD-10-CM | POA: Diagnosis not present

## 2022-09-17 DIAGNOSIS — R1310 Dysphagia, unspecified: Secondary | ICD-10-CM | POA: Diagnosis not present

## 2022-09-17 DIAGNOSIS — G40509 Epileptic seizures related to external causes, not intractable, without status epilepticus: Secondary | ICD-10-CM | POA: Diagnosis not present

## 2022-09-17 DIAGNOSIS — Z430 Encounter for attention to tracheostomy: Secondary | ICD-10-CM | POA: Diagnosis not present

## 2022-09-17 DIAGNOSIS — Z4659 Encounter for fitting and adjustment of other gastrointestinal appliance and device: Secondary | ICD-10-CM | POA: Diagnosis not present

## 2022-09-17 DIAGNOSIS — R109 Unspecified abdominal pain: Secondary | ICD-10-CM | POA: Diagnosis not present

## 2022-09-17 DIAGNOSIS — I10 Essential (primary) hypertension: Secondary | ICD-10-CM | POA: Diagnosis not present

## 2022-09-17 DIAGNOSIS — I4891 Unspecified atrial fibrillation: Secondary | ICD-10-CM | POA: Diagnosis not present

## 2022-09-20 DIAGNOSIS — R1310 Dysphagia, unspecified: Secondary | ICD-10-CM | POA: Diagnosis not present

## 2022-09-20 DIAGNOSIS — R0989 Other specified symptoms and signs involving the circulatory and respiratory systems: Secondary | ICD-10-CM | POA: Diagnosis not present

## 2022-09-20 DIAGNOSIS — Z430 Encounter for attention to tracheostomy: Secondary | ICD-10-CM | POA: Diagnosis not present

## 2022-09-20 DIAGNOSIS — G40509 Epileptic seizures related to external causes, not intractable, without status epilepticus: Secondary | ICD-10-CM | POA: Diagnosis not present

## 2022-09-20 DIAGNOSIS — J9621 Acute and chronic respiratory failure with hypoxia: Secondary | ICD-10-CM | POA: Diagnosis not present

## 2022-09-20 DIAGNOSIS — I10 Essential (primary) hypertension: Secondary | ICD-10-CM | POA: Diagnosis not present

## 2022-09-20 DIAGNOSIS — Z431 Encounter for attention to gastrostomy: Secondary | ICD-10-CM | POA: Diagnosis not present

## 2022-09-20 DIAGNOSIS — I4891 Unspecified atrial fibrillation: Secondary | ICD-10-CM | POA: Diagnosis not present

## 2022-09-21 ENCOUNTER — Inpatient Hospital Stay (HOSPITAL_COMMUNITY)
Admission: EM | Admit: 2022-09-21 | Discharge: 2022-09-26 | DRG: 326 | Disposition: A | Discharge status: Other Acute Inpatient Hospital | Payer: Medicare Other | Source: Skilled Nursing Facility | Attending: Emergency Medicine | Admitting: Emergency Medicine

## 2022-09-21 ENCOUNTER — Other Ambulatory Visit: Payer: Self-pay

## 2022-09-21 ENCOUNTER — Inpatient Hospital Stay (HOSPITAL_COMMUNITY): Payer: Medicare Other

## 2022-09-21 ENCOUNTER — Encounter (HOSPITAL_COMMUNITY): Payer: Self-pay

## 2022-09-21 DIAGNOSIS — G40909 Epilepsy, unspecified, not intractable, without status epilepticus: Secondary | ICD-10-CM | POA: Diagnosis not present

## 2022-09-21 DIAGNOSIS — Z8249 Family history of ischemic heart disease and other diseases of the circulatory system: Secondary | ICD-10-CM

## 2022-09-21 DIAGNOSIS — I2510 Atherosclerotic heart disease of native coronary artery without angina pectoris: Secondary | ICD-10-CM | POA: Diagnosis present

## 2022-09-21 DIAGNOSIS — I5022 Chronic systolic (congestive) heart failure: Secondary | ICD-10-CM | POA: Diagnosis present

## 2022-09-21 DIAGNOSIS — Z9911 Dependence on respirator [ventilator] status: Secondary | ICD-10-CM

## 2022-09-21 DIAGNOSIS — D649 Anemia, unspecified: Secondary | ICD-10-CM

## 2022-09-21 DIAGNOSIS — I341 Nonrheumatic mitral (valve) prolapse: Secondary | ICD-10-CM | POA: Diagnosis present

## 2022-09-21 DIAGNOSIS — Z79899 Other long term (current) drug therapy: Secondary | ICD-10-CM

## 2022-09-21 DIAGNOSIS — K219 Gastro-esophageal reflux disease without esophagitis: Secondary | ICD-10-CM | POA: Diagnosis not present

## 2022-09-21 DIAGNOSIS — E43 Unspecified severe protein-calorie malnutrition: Secondary | ICD-10-CM | POA: Diagnosis not present

## 2022-09-21 DIAGNOSIS — I480 Paroxysmal atrial fibrillation: Secondary | ICD-10-CM | POA: Diagnosis not present

## 2022-09-21 DIAGNOSIS — E876 Hypokalemia: Secondary | ICD-10-CM | POA: Diagnosis present

## 2022-09-21 DIAGNOSIS — Z79890 Hormone replacement therapy (postmenopausal): Secondary | ICD-10-CM

## 2022-09-21 DIAGNOSIS — Z8701 Personal history of pneumonia (recurrent): Secondary | ICD-10-CM

## 2022-09-21 DIAGNOSIS — R10819 Abdominal tenderness, unspecified site: Secondary | ICD-10-CM

## 2022-09-21 DIAGNOSIS — J9601 Acute respiratory failure with hypoxia: Secondary | ICD-10-CM | POA: Diagnosis not present

## 2022-09-21 DIAGNOSIS — M8580 Other specified disorders of bone density and structure, unspecified site: Secondary | ICD-10-CM | POA: Diagnosis present

## 2022-09-21 DIAGNOSIS — Z1624 Resistance to multiple antibiotics: Secondary | ICD-10-CM | POA: Diagnosis present

## 2022-09-21 DIAGNOSIS — I959 Hypotension, unspecified: Secondary | ICD-10-CM | POA: Diagnosis not present

## 2022-09-21 DIAGNOSIS — J449 Chronic obstructive pulmonary disease, unspecified: Secondary | ICD-10-CM | POA: Diagnosis not present

## 2022-09-21 DIAGNOSIS — J9610 Chronic respiratory failure, unspecified whether with hypoxia or hypercapnia: Secondary | ICD-10-CM | POA: Diagnosis not present

## 2022-09-21 DIAGNOSIS — I340 Nonrheumatic mitral (valve) insufficiency: Secondary | ICD-10-CM | POA: Diagnosis not present

## 2022-09-21 DIAGNOSIS — Z888 Allergy status to other drugs, medicaments and biological substances status: Secondary | ICD-10-CM

## 2022-09-21 DIAGNOSIS — R000 Tachycardia, unspecified: Secondary | ICD-10-CM | POA: Diagnosis not present

## 2022-09-21 DIAGNOSIS — I110 Hypertensive heart disease with heart failure: Secondary | ICD-10-CM | POA: Diagnosis not present

## 2022-09-21 DIAGNOSIS — J9611 Chronic respiratory failure with hypoxia: Secondary | ICD-10-CM | POA: Diagnosis present

## 2022-09-21 DIAGNOSIS — K9420 Gastrostomy complication, unspecified: Secondary | ICD-10-CM | POA: Diagnosis not present

## 2022-09-21 DIAGNOSIS — E039 Hypothyroidism, unspecified: Secondary | ICD-10-CM | POA: Diagnosis not present

## 2022-09-21 DIAGNOSIS — E872 Acidosis, unspecified: Secondary | ICD-10-CM | POA: Diagnosis not present

## 2022-09-21 DIAGNOSIS — Z930 Tracheostomy status: Secondary | ICD-10-CM

## 2022-09-21 DIAGNOSIS — K9423 Gastrostomy malfunction: Secondary | ICD-10-CM | POA: Diagnosis present

## 2022-09-21 DIAGNOSIS — Z7951 Long term (current) use of inhaled steroids: Secondary | ICD-10-CM

## 2022-09-21 DIAGNOSIS — R918 Other nonspecific abnormal finding of lung field: Secondary | ICD-10-CM | POA: Diagnosis not present

## 2022-09-21 DIAGNOSIS — D72829 Elevated white blood cell count, unspecified: Secondary | ICD-10-CM | POA: Diagnosis present

## 2022-09-21 DIAGNOSIS — Z681 Body mass index (BMI) 19 or less, adult: Secondary | ICD-10-CM | POA: Diagnosis not present

## 2022-09-21 DIAGNOSIS — D62 Acute posthemorrhagic anemia: Secondary | ICD-10-CM | POA: Diagnosis not present

## 2022-09-21 DIAGNOSIS — K9429 Other complications of gastrostomy: Secondary | ICD-10-CM | POA: Diagnosis not present

## 2022-09-21 DIAGNOSIS — Z931 Gastrostomy status: Secondary | ICD-10-CM | POA: Diagnosis not present

## 2022-09-21 DIAGNOSIS — J189 Pneumonia, unspecified organism: Secondary | ICD-10-CM | POA: Diagnosis not present

## 2022-09-21 DIAGNOSIS — G250 Essential tremor: Secondary | ICD-10-CM | POA: Diagnosis not present

## 2022-09-21 DIAGNOSIS — T85598A Other mechanical complication of other gastrointestinal prosthetic devices, implants and grafts, initial encounter: Principal | ICD-10-CM

## 2022-09-21 DIAGNOSIS — J9621 Acute and chronic respiratory failure with hypoxia: Secondary | ICD-10-CM | POA: Diagnosis not present

## 2022-09-21 DIAGNOSIS — J9612 Chronic respiratory failure with hypercapnia: Secondary | ICD-10-CM | POA: Diagnosis not present

## 2022-09-21 DIAGNOSIS — E873 Alkalosis: Secondary | ICD-10-CM | POA: Diagnosis present

## 2022-09-21 DIAGNOSIS — R1312 Dysphagia, oropharyngeal phase: Secondary | ICD-10-CM | POA: Diagnosis not present

## 2022-09-21 DIAGNOSIS — R1310 Dysphagia, unspecified: Secondary | ICD-10-CM | POA: Diagnosis not present

## 2022-09-21 LAB — CBC WITH DIFFERENTIAL/PLATELET
Abs Immature Granulocytes: 0.23 10*3/uL — ABNORMAL HIGH (ref 0.00–0.07)
Basophils Absolute: 0 10*3/uL (ref 0.0–0.1)
Basophils Relative: 0 %
Eosinophils Absolute: 0.2 10*3/uL (ref 0.0–0.5)
Eosinophils Relative: 2 %
HCT: 24 % — ABNORMAL LOW (ref 39.0–52.0)
Hemoglobin: 6.9 g/dL — CL (ref 13.0–17.0)
Immature Granulocytes: 2 %
Lymphocytes Relative: 6 %
Lymphs Abs: 0.8 10*3/uL (ref 0.7–4.0)
MCH: 25.6 pg — ABNORMAL LOW (ref 26.0–34.0)
MCHC: 28.8 g/dL — ABNORMAL LOW (ref 30.0–36.0)
MCV: 88.9 fL (ref 80.0–100.0)
Monocytes Absolute: 0.6 10*3/uL (ref 0.1–1.0)
Monocytes Relative: 5 %
Neutro Abs: 10.3 10*3/uL — ABNORMAL HIGH (ref 1.7–7.7)
Neutrophils Relative %: 85 %
Platelets: 361 10*3/uL (ref 150–400)
RBC: 2.7 MIL/uL — ABNORMAL LOW (ref 4.22–5.81)
RDW: 20.5 % — ABNORMAL HIGH (ref 11.5–15.5)
WBC: 12.1 10*3/uL — ABNORMAL HIGH (ref 4.0–10.5)
nRBC: 0 % (ref 0.0–0.2)

## 2022-09-21 LAB — I-STAT ARTERIAL BLOOD GAS, ED
Acid-Base Excess: 25 mmol/L — ABNORMAL HIGH (ref 0.0–2.0)
Bicarbonate: 53.8 mmol/L — ABNORMAL HIGH (ref 20.0–28.0)
Calcium, Ion: 1.14 mmol/L — ABNORMAL LOW (ref 1.15–1.40)
HCT: 25 % — ABNORMAL LOW (ref 39.0–52.0)
Hemoglobin: 8.5 g/dL — ABNORMAL LOW (ref 13.0–17.0)
O2 Saturation: 91 %
Patient temperature: 98.7
Potassium: 4.1 mmol/L (ref 3.5–5.1)
Sodium: 139 mmol/L (ref 135–145)
TCO2: >50 mmol/L — ABNORMAL HIGH (ref 22–32)
pCO2 arterial: 89.3 mmHg (ref 32–48)
pH, Arterial: 7.388 (ref 7.35–7.45)
pO2, Arterial: 69 mmHg — ABNORMAL LOW (ref 83–108)

## 2022-09-21 LAB — COMPREHENSIVE METABOLIC PANEL
ALT: 25 U/L (ref 0–44)
AST: 24 U/L (ref 15–41)
Albumin: 1.7 g/dL — ABNORMAL LOW (ref 3.5–5.0)
Alkaline Phosphatase: 81 U/L (ref 38–126)
Anion gap: 11 (ref 5–15)
BUN: 27 mg/dL — ABNORMAL HIGH (ref 8–23)
CO2: 41 mmol/L — ABNORMAL HIGH (ref 22–32)
Calcium: 8 mg/dL — ABNORMAL LOW (ref 8.9–10.3)
Chloride: 86 mmol/L — ABNORMAL LOW (ref 98–111)
Creatinine, Ser: <0.3 mg/dL — ABNORMAL LOW (ref 0.61–1.24)
Glucose, Bld: 100 mg/dL — ABNORMAL HIGH (ref 70–99)
Potassium: 3.8 mmol/L (ref 3.5–5.1)
Sodium: 138 mmol/L (ref 135–145)
Total Bilirubin: 0.4 mg/dL (ref 0.3–1.2)
Total Protein: 6.6 g/dL (ref 6.5–8.1)

## 2022-09-21 LAB — CBC
HCT: 25.7 % — ABNORMAL LOW (ref 39.0–52.0)
Hemoglobin: 7.4 g/dL — ABNORMAL LOW (ref 13.0–17.0)
MCH: 25.1 pg — ABNORMAL LOW (ref 26.0–34.0)
MCHC: 28.8 g/dL — ABNORMAL LOW (ref 30.0–36.0)
MCV: 87.1 fL (ref 80.0–100.0)
Platelets: 370 10*3/uL (ref 150–400)
RBC: 2.95 MIL/uL — ABNORMAL LOW (ref 4.22–5.81)
RDW: 20.4 % — ABNORMAL HIGH (ref 11.5–15.5)
WBC: 11.6 10*3/uL — ABNORMAL HIGH (ref 4.0–10.5)
nRBC: 0 % (ref 0.0–0.2)

## 2022-09-21 LAB — CREATININE, SERUM
Creatinine, Ser: 0.32 mg/dL — ABNORMAL LOW (ref 0.61–1.24)
GFR, Estimated: >60 mL/min (ref >60)

## 2022-09-21 LAB — LACTIC ACID, PLASMA
Lactic Acid, Venous: 0.6 mmol/L (ref 0.5–1.9)
Lactic Acid, Venous: 1 mmol/L (ref 0.5–1.9)

## 2022-09-21 LAB — GLUCOSE, CAPILLARY
Glucose-Capillary: 107 mg/dL — ABNORMAL HIGH (ref 70–99)
Glucose-Capillary: 79 mg/dL (ref 70–99)
Glucose-Capillary: 73 mg/dL (ref 70–99)

## 2022-09-21 LAB — PREPARE RBC (CROSSMATCH)

## 2022-09-21 LAB — HEMOGLOBIN AND HEMATOCRIT, BLOOD
HCT: 28.6 % — ABNORMAL LOW (ref 39.0–52.0)
Hemoglobin: 8.6 g/dL — ABNORMAL LOW (ref 13.0–17.0)

## 2022-09-21 LAB — LIPASE, BLOOD: Lipase: 23 U/L (ref 11–51)

## 2022-09-21 LAB — MRSA NEXT GEN BY PCR, NASAL: MRSA by PCR Next Gen: NOT DETECTED

## 2022-09-21 MED ORDER — ORAL CARE MOUTH RINSE: 15.0000 mL | OROMUCOSAL | Status: DC | PRN

## 2022-09-21 MED ORDER — FAMOTIDINE 20 MG PO TABS: 20.0000 mg | ORAL_TABLET | Freq: Two times a day (BID) | ORAL | Status: DC

## 2022-09-21 MED ORDER — ACETAZOLAMIDE SODIUM 500 MG IJ SOLR
500.0000 mg | Freq: Two times a day (BID) | INTRAMUSCULAR | Status: AC
  Administered 2022-09-21 – 2022-09-22 (×2): 500 mg via INTRAVENOUS
  Filled 2022-09-21 (×3): qty 500

## 2022-09-21 MED ORDER — ORAL CARE MOUTH RINSE
15.0000 mL | OROMUCOSAL | Status: DC
  Administered 2022-09-21 – 2022-09-26 (×55): 15 mL via OROMUCOSAL

## 2022-09-21 MED ORDER — CHLORHEXIDINE GLUCONATE CLOTH 2 % EX PADS
6.0000 | MEDICATED_PAD | Freq: Every day | CUTANEOUS | Status: DC
  Administered 2022-09-21 – 2022-09-25 (×5): 6 via TOPICAL

## 2022-09-21 MED ORDER — SODIUM CHLORIDE 0.9% IV SOLUTION: Freq: Once | INTRAVENOUS | Status: AC

## 2022-09-21 MED ORDER — DIATRIZOATE MEGLUMINE & SODIUM 66-10 % PO SOLN
90.0000 mL | Freq: Once | ORAL | Status: AC
  Administered 2022-09-21: 90 mL
  Filled 2022-09-21: qty 90

## 2022-09-21 MED ORDER — HEPARIN SODIUM (PORCINE) 5000 UNIT/ML IJ SOLN
5000.0000 [IU] | Freq: Three times a day (TID) | INTRAMUSCULAR | Status: DC
  Administered 2022-09-21 – 2022-09-26 (×16): 5000 [IU] via SUBCUTANEOUS
  Filled 2022-09-21 (×16): qty 1

## 2022-09-21 MED ORDER — POLYETHYLENE GLYCOL 3350 17 G PO PACK: 17.0000 g | PACK | Freq: Every day | ORAL | Status: DC | PRN

## 2022-09-21 MED ORDER — DOCUSATE SODIUM 100 MG PO CAPS: 100.0000 mg | ORAL_CAPSULE | Freq: Two times a day (BID) | ORAL | Status: DC | PRN

## 2022-09-21 NOTE — ED Triage Notes (Signed)
Pt BIB [facility] from [facility] for peg-tube replacement. VSS. GCS 10 at baseline.

## 2022-09-21 NOTE — ED Provider Notes (Signed)
[facility] EMERGENCY DEPARTMENT AT [facility] Provider Note   CSN: [id number] Arrival date & time: 09/21/22  1319     History  No chief complaint on file.   Austin Davis is a 82 y.o. male.  The history is provided by the patient and medical records. No language interpreter was used.  Abdominal Pain Pain location:  Generalized Pain severity:  Unable to specify Onset quality:  Unable to specify Timing:  Unable to specify Progression:  Unable to specify Relieved by:  Nothing Worsened by:  Palpation Associated symptoms: no chest pain, no chills, no constipation, no cough, no diarrhea, no fatigue, no nausea, no shortness of breath and no vomiting        Home Medications Prior to Admission medications   Medication Sig Start Date End Date Taking? Authorizing Provider  acetaminophen (TYLENOL) 325 MG tablet Place 650 mg into feeding tube every 8 (eight) hours as needed for fever, headache or moderate pain.    [provider]  amiodarone (PACERONE) 200 MG tablet Place 1 tablet (200 mg total) into feeding tube daily. 07/05/22   Simonne Martinet, NP  amLODipine (NORVASC) 5 MG tablet Place 5 mg into feeding tube daily.    [provider]  budesonide (PULMICORT) 0.5 MG/2ML nebulizer solution Take 0.5 mg by nebulization every 12 (twelve) hours.    [provider]  folic acid (FOLVITE) 1 MG tablet Take 1 tablet (1 mg total) by mouth daily. Patient taking differently: Place 1 mg into feeding tube daily. 09/29/20   Azucena Fallen, MD  gabapentin (NEURONTIN) 100 MG capsule 200 mg See admin instructions. 200 mg via tube every 12 hours.    [provider]  labetalol (NORMODYNE) 5 MG/ML injection Inject 5 mg into the vein every 2 (two) hours as needed (I10 - Essential (primary) hypertension (no indication provided on [facility])).    [provider]  lansoprazole (PREVACID) 30 MG capsule Place 30 mg into feeding tube daily at 12 noon.     [provider]  levalbuterol Pauline Aus) 1.25 MG/3ML nebulizer solution Take 1.25 mg by nebulization 4 (four) times daily.    [provider]  levothyroxine (SYNTHROID) 25 MCG tablet Place 25 mcg into feeding tube daily before breakfast.    [provider]  LORazepam (ATIVAN) 1 MG tablet Take 1 mg by mouth every 8 (eight) hours as needed for anxiety.    [provider]  Multiple Vitamin (MULTIVITAMIN) tablet Place 1 tablet into feeding tube daily.    [provider]  Nutritional Supplements (FEEDING SUPPLEMENT, OSMOLITE 1.5 CAL,) LIQD Place 1,000 mLs into feeding tube continuous. 09/10/22   Masters, Katie, DO  Omega-3 Fatty Acids (FISH OIL) 1000 MG CAPS Place 1,000 mg into feeding tube daily.    [provider]  polyethylene glycol (MIRALAX / GLYCOLAX) 17 g packet Place 17 g into feeding tube daily as needed (constipation).    [provider]  sertraline (ZOLOFT) 100 MG tablet Place 100 mg into feeding tube daily.    [provider]  sodium chloride (OCEAN) 0.65 % SOLN nasal spray Place 1 spray into both nostrils every 8 (eight) hours.    [provider]      Allergies    Prednisone and Cortisone    Review of Systems   Review of Systems  Constitutional:  Negative for chills and fatigue.  HENT:  Negative for congestion.   Respiratory:  Negative for cough, chest tightness and shortness of breath.  Cardiovascular:  Negative for chest pain.  Gastrointestinal:  Positive for abdominal pain. Negative for constipation, diarrhea, nausea and vomiting.  Musculoskeletal:  Negative for back pain.  Neurological:  Negative for headaches.  Psychiatric/Behavioral:  Negative for agitation.   All other systems reviewed and are negative.   Physical Exam Updated Vital Signs BP 98/69   Pulse 88   Temp 98.7 F (37.1 C) (Oral)   Resp 16   SpO2 100%  Physical Exam Vitals and nursing note reviewed.  Constitutional:       General: He is not in acute distress.    Appearance: He is well-developed. He is not ill-appearing, toxic-appearing or diaphoretic.  HENT:     Head: Normocephalic and atraumatic.     Nose: No congestion or rhinorrhea.     Mouth/Throat:     Pharynx: No oropharyngeal exudate or posterior oropharyngeal erythema.  Eyes:     Conjunctiva/sclera: Conjunctivae normal.  Cardiovascular:     Rate and Rhythm: Normal rate and regular rhythm.     Heart sounds: No murmur heard. Pulmonary:     Effort: Pulmonary effort is normal. No respiratory distress.     Breath sounds: No wheezing, rhonchi or rales.  Chest:     Chest wall: No tenderness.  Abdominal:     Palpations: Abdomen is soft.     Tenderness: There is abdominal tenderness.     Comments: Small hole in abdomen where G-tube was previously.  Some drainage present.  Some surrounding tenderness.  Musculoskeletal:        General: No swelling.     Cervical back: Neck supple.  Skin:    General: Skin is warm and dry.     Capillary Refill: Capillary refill takes less than 2 seconds.     Findings: No erythema.  Neurological:     Mental Status: He is alert.  Psychiatric:        Mood and Affect: Mood normal.     ED Results / Procedures / Treatments   Labs (all labs ordered are listed, but only abnormal results are displayed) Labs Reviewed  CBC WITH DIFFERENTIAL/PLATELET  COMPREHENSIVE METABOLIC PANEL  LIPASE, BLOOD  LACTIC ACID, PLASMA  LACTIC ACID, PLASMA    EKG None  Radiology No results found.  Procedures Procedures    Medications Ordered in ED Medications - No data to display  ED Course/ Medical Decision Making/ A&P                                 Medical Decision Making Amount and/or Complexity of Data Reviewed Labs: ordered.    Austin Davis is a 82 y.o. male with a past medical history significant for hyperlipidemia, previous GI bleed, seizures, paroxysmal atrial fibrillation, GERD, CHF, CAD, and is trach/vent  dependent who presents with feeding tube problem.  According to patient's documentation, he has had recurrent feeding tube problems where he was falling out.  It fell out today with the balloon inflated with a 22 French catheter and he was sent here per documentation to get seen by general surgery to discuss a new PEG or replacement surgery.  On exam, patient is not answering questions very well with his trach/vent but is saying yes and no to some questions.  He is reporting some abdominal discomfort and he was tender to palpation for me.  Bowel sounds were appreciated.  Otherwise he is denying fevers, chills, congestion or cough.  Denies any  chest pain or shortness of breath at this time.  Called and spoke to schedule to with general surgery who felt he may need a repeat surgery or procedure.  Initial plan was to get labs as patient has had significant electrolyte abnormalities when his PEG has been out of place so we will get these labs.  Initially plan was to admit to critical care or have them see the patient as he is on a vent if he needs admission however general surgery just called back to say they would like to see him first.  3:15 PM Spoke with general surgery again and he would like to try to replace the catheter himself and if he cannot get it, patient will need admission to critical care.  We will still get the labs.  Care will be signed out to Dr. Charm Barges while awaiting results of lab testing and attempt at replacement by general surgery.  If labs are concerning or there is a problem with the replacement, anticipate admission likely to critical care or critical care in consultation with medicine admitting.         Final Clinical Impression(s) / ED Diagnoses Final diagnoses:  Feeding tube dysfunction, initial encounter  Abdominal tenderness, rebound tenderness presence not specified, unspecified location    Clinical Impression: 1. Feeding tube dysfunction, initial encounter   2.  Abdominal tenderness, rebound tenderness presence not specified, unspecified location     Disposition: Care transferred to oncoming team to await for general surgery evaluation and labs to return.  Anticipate disposition based on general surgery recommendations, if needing admission, critical care will come evaluate patient to determine level of care needs.  This note was prepared with assistance of Conservation officer, historic buildings. Occasional wrong-word or sound-a-like substitutions may have occurred due to the inherent limitations of voice recognition software.     Rhianne Soman, Canary Brim, MD 09/21/22 [phone]

## 2022-09-21 NOTE — Progress Notes (Signed)
eLink Physician-Brief Progress Note Patient Name: AISON NEIER DOB: 1940-02-09 MRN: [id number]   Date of Service  09/21/2022  HPI/Events of Note  BSRN calling Elink regarding yellow-green drainage from new PEG site.  Surgery note at 6:30 PM documented with plan for KUB and possible need to relocate G-tube in the future  eICU Interventions  Defer plan to surgery RN advised to place slit 4 x 4 to the area Hold TF for now Monitor CBG. Low threshold to start IV dextrose if hypoglycemic   3:50 AM CBG 67. Unable to tolerate TF due to leaking PEG site. IV dextrose started   Intervention Category Minor Interventions: Routine modifications to care plan (e.g. PRN medications for pain, fever)  Keiva Dina Mechele Collin 09/21/2022, 8:40 PM

## 2022-09-21 NOTE — H&P (Signed)
NAME:  Austin Davis, MRN:  [id number], DOB:  1940/08/27, LOS: 0 ADMISSION DATE:  09/21/2022, CONSULTATION DATE:  09/21/2022 REFERRING MD:  Dr. Rush Landmark - EDP, CHIEF COMPLAINT: Malfunctioning PEG tube  History of Present Illness:  Austin Davis is a 82 year old male who presented to the ED via EMS from [facility] 9/1 with recurrent PEG tube dislodgment.  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  General surgery consulted for assistance in managing PEG tube dislodgment.  Of note patient was just admitted at this facility 8/20 to 8/21 with similar presentation of PEG tube dislodgment.  On ED admission patient was seen hemodynamically stable with borderline low blood pressure.  Oxygen saturations are 100% on 40% FiO2 via ventilator through tracheostomy.  Lab work significant for CO2 41 hemoglobin 6.9, hematocrit 24.0, WBC 12.1.  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 rate 16, SpO2 98%.    Vent Mode: PCV FiO2 (%):  [40 %] 40 % Set Rate:  [24 bmp] 24 bmp PEEP:  [5 cmH20] 5 cmH20 Plateau Pressure:  [25 cmH20] 25 cmH20  No intake or output data in the 24 hours ending 09/21/22 1531 There were no vitals filed for this visit.  Examination: General: Acute on chronic ill-appearing  condition elderly male lying in bed on mechanical ventilation via tracheostomy in no acute distress HEENT: Trach midline, MM pink/moist, PERRL,  Neuro: Will open eyes to verbal stimuli CV: s1s2 regular rate and rhythm, no murmur, rubs, or gallops,  PULM:  Bilateral rhonchi, no increased work of breathing, no added breath sounds, oxygen saturations at 100% on 40% FiO2 GI: soft, bowel sounds active in all 4 quadrants, non-tender, non-distended,  Extremities: warm/dry, no edema  Skin: no rashes or lesions   Resolved Hospital Problem list     Assessment & Plan:  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 Plateau pressures less than 30 cm H20.  Follow intermittent chest x-ray and ABG.   SAT/SBT as tolerated, mentation preclude extubation  Ensure adequate pulmonary hygiene  Follow cultures  VAP bundle in place  PAD protocol Routine trach care  Leukocytosis P: Trend CBC and fever curve No clinical indications for repeat sputum culture at this time  Anemia P: 1 unit PRBC  Trend CBC Hemoglobin goal greater than 7    Best Practice (right click and "Reselect all SmartList Selections" daily)   Diet/type: NPO DVT prophylaxis: prophylactic heparin  GI prophylaxis: PPI Lines: N/A Foley:  N/A Code Status:  full code Last date of multidisciplinary goals of care discussion: Pending   Labs   CBC: Recent Labs  Lab 09/21/22 1454  WBC 12.1*  NEUTROABS 10.3*  HGB 6.9*  HCT 24.0*  MCV 88.9  PLT 361    Basic Metabolic Panel: No results for input(s): "NA", "K", "CL", "CO2", "GLUCOSE", "BUN", "CREATININE", "CALCIUM", "MG", "PHOS" in the last 168 hours. GFR: Estimated Creatinine Clearance: 59.9 mL/min (A) (by C-G  formula based on SCr of 0.49 mg/dL (L)). Recent Labs  Lab 09/21/22 1454  WBC 12.1*    Liver Function Tests: No results for input(s): "AST", "ALT", "ALKPHOS", "BILITOT", "PROT", "ALBUMIN" in the last 168 hours. No results for input(s): "LIPASE", "AMYLASE" in the last 168 hours. No results for input(s): "AMMONIA" in the last 168 hours.  ABG    Component Value Date/Time   PHART 7.452 (H) 07/04/2022 0935   PCO2ART 59.7 (H) 07/04/2022 0935   PO2ART 115 (H) 07/04/2022 0935   HCO3 53.7 (H) 09/09/2022 1653   TCO2 >50 (H) 09/09/2022 1653   ACIDBASEDEF 2.0 05/30/2021 0052   O2SAT 99 09/09/2022 1653     Coagulation Profile: No results for input(s): "INR", "PROTIME" in the last 168 hours.  Cardiac Enzymes: No results for input(s): "CKTOTAL", "CKMB", "CKMBINDEX", "TROPONINI" in the last 168 hours.  HbA1C: Hgb A1c MFr Bld  Date/Time Value Ref Range Status  01/04/2022 11:22 PM 5.3 4.8 - 5.6 % Final    Comment:    (NOTE)         Prediabetes: 5.7 - 6.4         Diabetes: >6.4         Glycemic control for adults with diabetes: <7.0   04/11/2021 09:12 PM 5.5 4.8 - 5.6 % Final    Comment:    (NOTE) Pre diabetes:          5.7%-6.4%  Diabetes:              >6.4%  Glycemic control for   <7.0% adults with diabetes     CBG: No results for input(s): "GLUCAP" in the last 168 hours.  Review of Systems:   Unable to assess   Past Medical History:  He,  has a past medical history of Aspiration pneumonia (HCC) (03/25/2021), CHF (congestive heart failure) (HCC), Coronary artery disease, Essential tremor, GERD (gastroesophageal reflux disease), GI bleed (2019), Headache, Hypothyroidism, Low blood sugar, Mitral regurgitation, Mitral valve prolapse, Osteopenia (2021), Paroxysmal atrial fibrillation (HCC), Seizure-like activity (HCC), and Tremor.   Surgical History:   Past Surgical History:  Procedure Laterality Date   COLONOSCOPY WITH PROPOFOL N/A 02/19/2017   Procedure: COLONOSCOPY WITH  PROPOFOL;  Surgeon: Charlott Rakes, MD;  Location: [facility];  Service: Endoscopy;  Laterality: N/A;   IR GASTROSTOMY TUBE MOD SED  10/18/2020   IR IVC FILTER PLMT / S&I /IMG GUID/MOD SED  11/01/2020   IR REPLACE G-TUBE SIMPLE WO FLUORO  03/15/2022   IR REPLACE G-TUBE SIMPLE WO FLUORO  06/30/2022   IR REPLACE G-TUBE SIMPLE WO FLUORO  09/09/2022   laser eye surgery for retina detachment     MITRAL VALVE REPAIR  01/2003   monitor  02/05/2006   polyp removal     TONSILLECTOMY     tooth removal     as a teenager   TRACHEOSTOMY TUBE PLACEMENT N/A 10/26/2020   Procedure: TRACHEOSTOMY;  Surgeon: Drema Halon, MD;  Location: [facility] OR;  Service: ENT;  Laterality: N/A;     Social History:   reports that he has never smoked. He has never used smokeless tobacco. He reports current alcohol use. He reports that he does not use drugs.   Family History:  His family history includes Asthma in his daughter; CAD  in his mother; Epilepsy in his son; Heart disease in his mother; Heart failure in his father, maternal grandmother, and paternal grandmother; Migraines in his mother; Pneumonia in his maternal grandfather; Skin cancer in his father; Stroke in his paternal grandfather; Tremor in his father; Valvular heart disease in his father. There is no history of Parkinson's disease.   Allergies Allergies  Allergen Reactions   Prednisone Other (See Comments)    Documented on MAR Unknown reaction   Cortisone Other (See Comments)    Documented on MAR Unknown reaction     Home Medications  Prior to Admission medications   Medication Sig Start Date End Date Taking? Authorizing Provider  acetaminophen (TYLENOL) 325 MG tablet Place 650 mg into feeding tube every 8 (eight) hours as needed for fever, headache or moderate pain.    [provider]  amiodarone (PACERONE) 200 MG tablet Place 1 tablet (200 mg total) into feeding tube daily. 07/05/22   Simonne Martinet, NP  amLODipine (NORVASC) 5 MG  tablet Place 5 mg into feeding tube daily.    [provider]  budesonide (PULMICORT) 0.5 MG/2ML nebulizer solution Take 0.5 mg by nebulization every 12 (twelve) hours.    [provider]  folic acid (FOLVITE) 1 MG tablet Take 1 tablet (1 mg total) by mouth daily. Patient taking differently: Place 1 mg into feeding tube daily. 09/29/20   Azucena Fallen, MD  gabapentin (NEURONTIN) 100 MG capsule 200 mg See admin instructions. 200 mg via tube every 12 hours.    [provider]  labetalol (NORMODYNE) 5 MG/ML injection Inject 5 mg into the vein every 2 (two) hours as needed (I10 - Essential (primary) hypertension (no indication provided on [facility])).    [provider]  lansoprazole (PREVACID) 30 MG capsule Place 30 mg into feeding tube daily at 12 noon.    [provider]  levalbuterol Pauline Aus) 1.25 MG/3ML nebulizer solution Take 1.25 mg by nebulization 4 (four) times daily.    [provider]  levothyroxine (SYNTHROID) 25 MCG tablet Place 25 mcg into feeding tube daily before breakfast.    [provider]  LORazepam (ATIVAN) 1 MG tablet Take 1 mg by mouth every 8 (eight) hours as needed for anxiety.    [provider]  Multiple Vitamin (MULTIVITAMIN) tablet Place 1 tablet into feeding tube daily.    [provider]  Nutritional Supplements (FEEDING SUPPLEMENT, OSMOLITE 1.5 CAL,) LIQD Place 1,000 mLs into feeding tube continuous. 09/10/22   Masters, Katie, DO  Omega-3 Fatty Acids (FISH OIL) 1000 MG CAPS Place 1,000 mg into feeding tube daily.    [provider]  polyethylene glycol (MIRALAX / GLYCOLAX) 17 g packet Place 17 g into feeding tube daily as needed (constipation).    [provider]  sertraline (ZOLOFT) 100 MG tablet Place 100 mg into feeding tube daily.    [provider]  sodium chloride (OCEAN) 0.65 % SOLN nasal spray Place 1 spray into both nostrils every 8 (eight) hours.     [provider]     Critical care time:   CRITICAL CARE Performed by: Lurlene Ronda D. Harris  Total critical care time: 40 minutes  Critical care time was exclusive of separately billable procedures and treating other patients.  Critical care was necessary to treat or prevent imminent or life-threatening deterioration.  Critical care was time spent personally by me on the following activities: development of treatment plan with patient and/or surrogate as well as nursing, discussions with  consultants, evaluation of patient's response to treatment, examination of patient, obtaining history from patient or surrogate, ordering and performing treatments and interventions, ordering and review of laboratory studies, ordering and review of radiographic studies, pulse oximetry and re-evaluation of patient's condition.   Batul Diego D. Harris, NP-C [facility] Personal contact information can be found on Amion  If no contact or response made please call [phone] 09/21/2022, 3:44 PM

## 2022-09-21 NOTE — Progress Notes (Signed)
Pt transported on vent from 034 to 2M15. Without any complications, RN at bedside, RT will monitor as needed.

## 2022-09-21 NOTE — ED Notes (Signed)
ED TO INPATIENT HANDOFF REPORT  ED Nurse Name and Phone #: [phone]  S Name/Age/Gender Austin Davis 82 y.o. male Room/Bed: 034C/034C  Code Status   Code Status: Full Code  Home/SNF/Other Skilled nursing facility  Is this baseline? Yes   Triage Complete: Triage complete  Chief Complaint Ventilator dependence [facility]) [Z99.11]  Triage Note Pt BIB [facility] from [facility] for peg-tube replacement. VSS. GCS 10 at baseline.    Allergies Allergies  Allergen Reactions   Prednisone Other (See Comments)    Documented on MAR Unknown reaction   Cortisone Other (See Comments)    Documented on MAR Unknown reaction    Level of Care/Admitting Diagnosis ED Disposition     ED Disposition  Admit   Condition  --   Comment  Hospital Area: [facility] [[id number]]  Level of Care: ICU [6]  May admit patient to [facility] or [facility] if equivalent level of care is available:: Yes  Covid Evaluation: Asymptomatic - no recent exposure (last 10 days) testing not required  Diagnosis: Ventilator dependence [facility]) [[id number]]  Admitting Physician: Cheri Fowler [[id number]]  Attending Physician: Cheri Fowler [[id number]]  Certification:: I certify this patient will need inpatient services for at least 2 midnights  Expected Medical Readiness: 09/24/2022          B Medical/Surgery History Past Medical History:  Diagnosis Date   Aspiration pneumonia (HCC) 03/25/2021   CHF (congestive heart failure) (HCC)    Coronary artery disease    Essential tremor    GERD (gastroesophageal reflux disease)    GI bleed 2019   hospitalized at [facility] for one week   Headache    since childhood   Hypothyroidism    Low blood sugar    since childhood, controlled by diet   Mitral regurgitation    Mitral valve prolapse    Osteopenia 2021   Paroxysmal atrial fibrillation (HCC)    Seizure-like activity (HCC)    Right gaze preference at baseline, essential termor at baseline, intermittent  reduced responsiveness secondary to toxic/metabolic processes and poor cognitive reserve   Tremor    Past Surgical History:  Procedure Laterality Date   COLONOSCOPY WITH PROPOFOL N/A 02/19/2017   Procedure: COLONOSCOPY WITH PROPOFOL;  Surgeon: Charlott Rakes, MD;  Location: [facility];  Service: Endoscopy;  Laterality: N/A;   IR GASTROSTOMY TUBE MOD SED  10/18/2020   IR IVC FILTER PLMT / S&I /IMG GUID/MOD SED  11/01/2020   IR REPLACE G-TUBE SIMPLE WO FLUORO  03/15/2022   IR REPLACE G-TUBE SIMPLE WO FLUORO  06/30/2022   IR REPLACE G-TUBE SIMPLE WO FLUORO  09/09/2022   laser eye surgery for retina detachment     MITRAL VALVE REPAIR  01/2003   monitor  02/05/2006   polyp removal     TONSILLECTOMY     tooth removal     as a teenager   TRACHEOSTOMY TUBE PLACEMENT N/A 10/26/2020   Procedure: TRACHEOSTOMY;  Surgeon: Drema Halon, MD;  Location: [facility] OR;  Service: ENT;  Laterality: N/A;     A IV Location/Drains/Wounds Patient Lines/Drains/Airways Status     Active Line/Drains/Airways     Name Placement date Placement time Site Days   Peripheral IV 09/21/22 20 G Anterior;Left Forearm 09/21/22  1454  Forearm  less than 1   Gastrostomy/Enterostomy Gastrostomy 24 Fr. 03/15/22  1010  --  190   Gastrostomy/Enterostomy Gastrostomy 24 Fr. LUQ 09/09/22  1510  LUQ  12   Tracheostomy Shiley XLT Distal 6  mm Cuffed;Distal --  --  6 mm  --   Tracheostomy Other (Comment) 7 mm Cuffed 09/09/22  --  7 mm  12   Pressure Injury 02/01/22 Buttocks Left Stage 2 -  Partial thickness loss of dermis presenting as a shallow open injury with a red, pink wound bed without slough. 02/01/22  0640  -- 232   Pressure Injury 06/30/22 Coccyx Mid;Right Stage 1 -  Intact skin with non-blanchable redness of a localized area usually over a bony prominence. pink 06/30/22  0800  -- 83   Wound / Incision (Open or Dehisced) 07/01/22 Incision - Open Groin Anterior;Proximal;Right 07/01/22  1701  Groin  82             Intake/Output Last 24 hours No intake or output data in the 24 hours ending 09/21/22 1539  Labs/Imaging Results for orders placed or performed during the hospital encounter of 09/21/22 (from the past 48 hour(s))  CBC with Differential     Status: Abnormal   Collection Time: 09/21/22  2:54 PM  Result Value Ref Range   WBC 12.1 (H) 4.0 - 10.5 K/uL   RBC 2.70 (L) 4.22 - 5.81 MIL/uL   Hemoglobin 6.9 (LL) 13.0 - 17.0 g/dL    Comment: REPEATED TO VERIFY THIS CRITICAL RESULT HAS VERIFIED AND BEEN CALLED TO J Roy Snuffer RN BY DANIELLE LONG ON 09 01 2024 AT 1524, AND HAS BEEN READ BACK.     HCT 24.0 (L) 39.0 - 52.0 %   MCV 88.9 80.0 - 100.0 fL   MCH 25.6 (L) 26.0 - 34.0 pg   MCHC 28.8 (L) 30.0 - 36.0 g/dL   RDW 13.0 (H) 86.5 - 78.4 %   Platelets 361 150 - 400 K/uL    Comment: REPEATED TO VERIFY   nRBC 0.0 0.0 - 0.2 %   Neutrophils Relative % 85 %   Neutro Abs 10.3 (H) 1.7 - 7.7 K/uL   Lymphocytes Relative 6 %   Lymphs Abs 0.8 0.7 - 4.0 K/uL   Monocytes Relative 5 %   Monocytes Absolute 0.6 0.1 - 1.0 K/uL   Eosinophils Relative 2 %   Eosinophils Absolute 0.2 0.0 - 0.5 K/uL   Basophils Relative 0 %   Basophils Absolute 0.0 0.0 - 0.1 K/uL   Immature Granulocytes 2 %   Abs Immature Granulocytes 0.23 (H) 0.00 - 0.07 K/uL    Comment: Performed at [facility], [address], [place], [place] [address]  Lactic acid, plasma     Status: None   Collection Time: 09/21/22  2:54 PM  Result Value Ref Range   Lactic Acid, Venous 0.6 0.5 - 1.9 mmol/L    Comment: Performed at [facility], [address], [place], [place] [address]   No results found.  Pending Labs Unresulted Labs (From admission, onward)     Start     Ordered   09/22/22 0500  CBC  Tomorrow morning,   R        09/21/22 1531   09/22/22 0500  Basic metabolic panel  Tomorrow morning,   R        09/21/22 1531   09/22/22 0500  Magnesium  Tomorrow morning,   R        09/21/22 1531   09/22/22 0500  Phosphorus   Tomorrow morning,   R        09/21/22 1531   09/21/22 1536  Type and screen [facility]  (Blood Administration  Adult)  Once,   R       Comments: [facility]    09/21/22 1536   09/21/22 1536  Prepare RBC (crossmatch)  (Blood Administration Adult)  Once,   R       Question Answer Comment  # of Units 1 unit   Transfusion Indications Hemoglobin < 7 gm/dL and symptomatic   Number of Units to Keep Ahead NO units ahead   If emergent release call blood bank Not emergent release      09/21/22 1536   09/21/22 1530  CBC  (heparin)  Once,   R       Comments: Baseline for heparin therapy IF NOT ALREADY DRAWN.  Notify MD if PLT < 100 K.    09/21/22 1531   09/21/22 1530  Creatinine, serum  (heparin)  Once,   R       Comments: Baseline for heparin therapy IF NOT ALREADY DRAWN.    09/21/22 1531   09/21/22 1443  Comprehensive metabolic panel  Once,   STAT        09/21/22 1442   09/21/22 1443  Lipase, blood  Once,   STAT        09/21/22 1442   09/21/22 1443  Lactic acid, plasma  (Lactic Acid)  Now then every 2 hours,   R (with STAT occurrences)      09/21/22 1442            Vitals/Pain Today's Vitals   09/21/22 1327 09/21/22 1328 09/21/22 1430 09/21/22 1500  BP: 98/69 98/69 101/67 98/82  Pulse: 89 88 100 (!) 104  Resp: 16   16  Temp: 98.7 F (37.1 C)     TempSrc: Oral     SpO2: 100% 100% 97% 98%    Isolation Precautions No active isolations  Medications Medications  docusate sodium (COLACE) capsule 100 mg (has no administration in time range)  polyethylene glycol (MIRALAX / GLYCOLAX) packet 17 g (has no administration in time range)  heparin injection 5,000 Units (has no administration in time range)  famotidine (PEPCID) tablet 20 mg (has no administration in time range)  0.9 %  sodium chloride infusion (Manually program via Guardrails IV Fluids) (has no administration in time range)    Mobility non-ambulatory     Focused  Assessments Cardiac Assessment Handoff:    Lab Results  Component Value Date   CKTOTAL 26 (L) 09/22/2020   Lab Results  Component Value Date   DDIMER <0.27 09/16/2020   Does the Patient currently have chest pain? No    R Recommendations: See Admitting Provider Note  Report given to:   Additional Notes:

## 2022-09-21 NOTE — ED Notes (Signed)
Paged Stechschulte for Dr. Rush Landmark per verbal order

## 2022-09-21 NOTE — Consult Note (Signed)
Consulting Physician: Hyman Hopes Deon Duer  Referring Provider: Dr. Rush Landmark  Chief Complaint: G tube dislodged  Reason for Consult: G tube dislodged   Subjective   HPI: Austin Davis is an 82 y.o. male who is here for a dislodged G-tube.  Has gone up to 87 [place] recently due to tube dislodgment issues.   Past Medical History:  Diagnosis Date   Aspiration pneumonia (HCC) 03/25/2021   CHF (congestive heart failure) (HCC)    Coronary artery disease    Essential tremor    GERD (gastroesophageal reflux disease)    GI bleed 2019   hospitalized at [facility] for one week   Headache    since childhood   Hypothyroidism    Low blood sugar    since childhood, controlled by diet   Mitral regurgitation    Mitral valve prolapse    Osteopenia 2021   Paroxysmal atrial fibrillation (HCC)    Seizure-like activity (HCC)    Right gaze preference at baseline, essential termor at baseline, intermittent reduced responsiveness secondary to toxic/metabolic processes and poor cognitive reserve   Tremor     Past Surgical History:  Procedure Laterality Date   COLONOSCOPY WITH PROPOFOL N/A 02/19/2017   Procedure: COLONOSCOPY WITH PROPOFOL;  Surgeon: Charlott Rakes, MD;  [facility];  Service: Endoscopy;  Laterality: N/A;   IR GASTROSTOMY TUBE MOD SED  10/18/2020   IR IVC FILTER PLMT / S&I /IMG GUID/MOD SED  11/01/2020   IR REPLACE G-TUBE SIMPLE WO FLUORO  03/15/2022   IR REPLACE G-TUBE SIMPLE WO FLUORO  06/30/2022   IR REPLACE G-TUBE SIMPLE WO FLUORO  09/09/2022   laser eye surgery for retina detachment     MITRAL VALVE REPAIR  01/2003   monitor  02/05/2006   polyp removal     TONSILLECTOMY     tooth removal     as a teenager   TRACHEOSTOMY TUBE PLACEMENT N/A 10/26/2020   Procedure: TRACHEOSTOMY;  Surgeon: Drema Halon, MD;  Location: [facility] OR;  Service: ENT;  Laterality: N/A;    Family History  Problem Relation Age of Onset   Heart disease Mother    CAD Mother     Migraines Mother    Heart failure Father    Skin cancer Father    Valvular heart disease Father        prolapsed valve   Tremor Father        "probably had a bit of tremor in his old age"   Heart failure Maternal Grandmother    Pneumonia Maternal Grandfather    Heart failure Paternal Grandmother    Stroke Paternal Grandfather    Asthma Daughter    Epilepsy Son    Parkinson's disease Neg Hx     Social:  reports that he has never smoked. He has never used smokeless tobacco. He reports current alcohol use. He reports that he does not use drugs.  Allergies:  Allergies  Allergen Reactions   Prednisone Other (See Comments)    Documented on MAR Unknown reaction   Cortisone Other (See Comments)    Documented on MAR Unknown reaction    Medications: Current Outpatient Medications  Medication Instructions   acetaminophen (TYLENOL) 650 mg, Per Tube, Every 8 hours PRN   amiodarone (PACERONE) 200 mg, Per Tube, Daily   amLODipine (NORVASC) 5 mg, Per Tube, Daily   budesonide (PULMICORT) 0.5 mg, Nebulization, Every 12 hours   Fish Oil 1,000 mg, Per Tube, Daily   folic acid (FOLVITE)  1 mg, Oral, Daily   gabapentin (NEURONTIN) 200 mg, See admin instructions, 200 mg via tube every 12 hours.   labetalol (NORMODYNE) 5 mg, Intravenous, Every 2 hours PRN   lansoprazole (PREVACID) 30 mg, Per Tube, Daily   levalbuterol (XOPENEX) 1.25 mg, Nebulization, 4 times daily   levothyroxine (SYNTHROID) 25 mcg, Per Tube, Daily before breakfast   LORazepam (ATIVAN) 1 mg, Oral, Every 8 hours PRN   Multiple Vitamin (MULTIVITAMIN) tablet 1 tablet, Per Tube, Daily   Nutritional Supplements (FEEDING SUPPLEMENT, OSMOLITE 1.5 CAL,) LIQD 1,000 mLs, Per Tube, Continuous   polyethylene glycol (MIRALAX / GLYCOLAX) 17 g, Per Tube, Daily PRN   sertraline (ZOLOFT) 100 mg, Per Tube, Daily   sodium chloride (OCEAN) 0.65 % SOLN nasal spray 1 spray, Each Nare, Every 8 hours    ROS - all of the below systems have been  reviewed with the patient and positives are indicated with bold text General: chills, fever or night sweats Eyes: blurry vision or double vision ENT: epistaxis or sore throat Allergy/Immunology: itchy/watery eyes or nasal congestion Hematologic/Lymphatic: bleeding problems, blood clots or swollen lymph nodes Endocrine: temperature intolerance or unexpected weight changes Breast: new or changing breast lumps or nipple discharge Resp: cough, shortness of breath, or wheezing CV: chest pain or dyspnea on exertion GI: as per HPI GU: dysuria, trouble voiding, or hematuria MSK: joint pain or joint stiffness Neuro: TIA or stroke symptoms Derm: pruritus and skin lesion changes Psych: anxiety and depression  Objective   PE Blood pressure 98/61, pulse 86, temperature 98.7 F (37.1 C), temperature source Oral, resp. rate (!) 26, SpO2 100%.  GI: Abd G-tube site with large hole at the skin and surrounding irritated erythematous skin and indurated tissues.  Results for orders placed or performed during the hospital encounter of 09/21/22 (from the past 24 hour(s))  CBC with Differential     Status: Abnormal   Collection Time: 09/21/22  2:54 PM  Result Value Ref Range   WBC 12.1 (H) 4.0 - 10.5 K/uL   RBC 2.70 (L) 4.22 - 5.81 MIL/uL   Hemoglobin 6.9 (LL) 13.0 - 17.0 g/dL   HCT 60.4 (L) 54.0 - 98.1 %   MCV 88.9 80.0 - 100.0 fL   MCH 25.6 (L) 26.0 - 34.0 pg   MCHC 28.8 (L) 30.0 - 36.0 g/dL   RDW 19.1 (H) 47.8 - 29.5 %   Platelets 361 150 - 400 K/uL   nRBC 0.0 0.0 - 0.2 %   Neutrophils Relative % 85 %   Neutro Abs 10.3 (H) 1.7 - 7.7 K/uL   Lymphocytes Relative 6 %   Lymphs Abs 0.8 0.7 - 4.0 K/uL   Monocytes Relative 5 %   Monocytes Absolute 0.6 0.1 - 1.0 K/uL   Eosinophils Relative 2 %   Eosinophils Absolute 0.2 0.0 - 0.5 K/uL   Basophils Relative 0 %   Basophils Absolute 0.0 0.0 - 0.1 K/uL   Immature Granulocytes 2 %   Abs Immature Granulocytes 0.23 (H) 0.00 - 0.07 K/uL  Comprehensive  metabolic panel     Status: Abnormal   Collection Time: 09/21/22  2:54 PM  Result Value Ref Range   Sodium 138 135 - 145 mmol/L   Potassium 3.8 3.5 - 5.1 mmol/L   Chloride 86 (L) 98 - 111 mmol/L   CO2 41 (H) 22 - 32 mmol/L   Glucose, Bld 100 (H) 70 - 99 mg/dL   BUN 27 (H) 8 - 23 mg/dL   Creatinine, Ser <  0.30 (L) 0.61 - 1.24 mg/dL   Calcium 8.0 (L) 8.9 - 10.3 mg/dL   Total Protein 6.6 6.5 - 8.1 g/dL   Albumin 1.7 (L) 3.5 - 5.0 g/dL   AST 24 15 - 41 U/L   ALT 25 0 - 44 U/L   Alkaline Phosphatase 81 38 - 126 U/L   Total Bilirubin 0.4 0.3 - 1.2 mg/dL   GFR, Estimated NOT CALCULATED >60 mL/min   Anion gap 11 5 - 15  Lipase, blood     Status: None   Collection Time: 09/21/22  2:54 PM  Result Value Ref Range   Lipase 23 11 - 51 U/L  Lactic acid, plasma     Status: None   Collection Time: 09/21/22  2:54 PM  Result Value Ref Range   Lactic Acid, Venous 0.6 0.5 - 1.9 mmol/L  Prepare RBC (crossmatch)     Status: None   Collection Time: 09/21/22  3:36 PM  Result Value Ref Range   Order Confirmation      ORDER PROCESSED BY BLOOD BANK Performed at [facility], [address], [place], [place] [address]   Type and screen [facility]     Status: None (Preliminary result)   Collection Time: 09/21/22  4:05 PM  Result Value Ref Range   ABO/RH(D) O POS    Antibody Screen NEG    Sample Expiration 09/24/2022,2359    Unit Number [id number]    Blood Component Type RED CELLS,LR    Unit division 00    Status of Unit ISSUED    Transfusion Status OK TO TRANSFUSE    Crossmatch Result      Compatible Performed at [facility], [address], [place], [place] [address]   I-Stat arterial blood gas, ED     Status: Abnormal   Collection Time: 09/21/22  4:12 PM  Result Value Ref Range   pH, Arterial 7.388 7.35 - 7.45   pCO2 arterial 89.3 (HH) 32 - 48 mmHg   pO2, Arterial 69 (L) 83 - 108 mmHg   Bicarbonate 53.8 (H) 20.0 - 28.0 mmol/L   TCO2 >50 (H) 22 - 32 mmol/L    O2 Saturation 91 %   Acid-Base Excess 25.0 (H) 0.0 - 2.0 mmol/L   Sodium 139 135 - 145 mmol/L   Potassium 4.1 3.5 - 5.1 mmol/L   Calcium, Ion 1.14 (L) 1.15 - 1.40 mmol/L   HCT 25.0 (L) 39.0 - 52.0 %   Hemoglobin 8.5 (L) 13.0 - 17.0 g/dL   Patient temperature 84.6 F    Collection site RADIAL, ALLEN'S TEST ACCEPTABLE    Drawn by RT    Sample type ARTERIAL    Comment NOTIFIED PHYSICIAN   Glucose, capillary     Status: Abnormal   Collection Time: 09/21/22  4:31 PM  Result Value Ref Range   Glucose-Capillary 107 (H) 70 - 99 mg/dL  Lactic acid, plasma     Status: None   Collection Time: 09/21/22  4:49 PM  Result Value Ref Range   Lactic Acid, Venous 1.0 0.5 - 1.9 mmol/L  CBC     Status: Abnormal   Collection Time: 09/21/22  4:49 PM  Result Value Ref Range   WBC 11.6 (H) 4.0 - 10.5 K/uL   RBC 2.95 (L) 4.22 - 5.81 MIL/uL   Hemoglobin 7.4 (L) 13.0 - 17.0 g/dL   HCT 96.2 (L) 95.2 - 84.1 %   MCV 87.1 80.0 - 100.0 fL  MCH 25.1 (L) 26.0 - 34.0 pg   MCHC 28.8 (L) 30.0 - 36.0 g/dL   RDW 36.6 (H) 44.0 - 34.7 %   Platelets 370 150 - 400 K/uL   nRBC 0.0 0.0 - 0.2 %  Creatinine, serum     Status: Abnormal   Collection Time: 09/21/22  4:49 PM  Result Value Ref Range   Creatinine, Ser 0.32 (L) 0.61 - 1.24 mg/dL   GFR, Estimated >42 >59 mL/min    Imaging Orders  No imaging studies ordered today     Assessment and Plan   Austin Davis is an 82 y.o. male with a dislodged G-tube.  I replaced a 24 [place] G-tube at the bedside.  I will check an x-ray to make sure this is located in the stomach.  This G-tube site has become quite dilated.  I am not confident this G-tube will stay in place.  We may want to consider relocating a new G-tube and perhaps using a GJ tube for a while to allow the other site to heal.  I would watch this G-tube over the weekend and we could formulate a plan for next steps during the week.    ICD-10-CM   1. Feeding tube dysfunction, initial encounter  T85.598A      2. Abdominal tenderness, rebound tenderness presence not specified, unspecified location  R10.819        Quentin Ore, MD  [facility] Use [email or web address] to contact on call provider  New Patient Billing: [id number] - Straightforward / Low MDM

## 2022-09-22 DIAGNOSIS — I480 Paroxysmal atrial fibrillation: Secondary | ICD-10-CM

## 2022-09-22 DIAGNOSIS — K9423 Gastrostomy malfunction: Secondary | ICD-10-CM | POA: Diagnosis not present

## 2022-09-22 DIAGNOSIS — J9612 Chronic respiratory failure with hypercapnia: Secondary | ICD-10-CM | POA: Diagnosis not present

## 2022-09-22 DIAGNOSIS — Z9911 Dependence on respirator [ventilator] status: Secondary | ICD-10-CM | POA: Diagnosis not present

## 2022-09-22 DIAGNOSIS — E43 Unspecified severe protein-calorie malnutrition: Secondary | ICD-10-CM | POA: Diagnosis not present

## 2022-09-22 DIAGNOSIS — E872 Acidosis, unspecified: Secondary | ICD-10-CM | POA: Diagnosis not present

## 2022-09-22 LAB — BASIC METABOLIC PANEL
Anion gap: 10 (ref 5–15)
Anion gap: 11 (ref 5–15)
BUN: 22 mg/dL (ref 8–23)
BUN: 18 mg/dL (ref 8–23)
CO2: 38 mmol/L — ABNORMAL HIGH (ref 22–32)
CO2: 30 mmol/L (ref 22–32)
Calcium: 8.3 mg/dL — ABNORMAL LOW (ref 8.9–10.3)
Calcium: 8.4 mg/dL — ABNORMAL LOW (ref 8.9–10.3)
Chloride: 92 mmol/L — ABNORMAL LOW (ref 98–111)
Chloride: 97 mmol/L — ABNORMAL LOW (ref 98–111)
Creatinine, Ser: 0.32 mg/dL — ABNORMAL LOW (ref 0.61–1.24)
Creatinine, Ser: <0.3 mg/dL — ABNORMAL LOW (ref 0.61–1.24)
GFR, Estimated: >60 mL/min (ref >60)
Glucose, Bld: 76 mg/dL (ref 70–99)
Glucose, Bld: 106 mg/dL — ABNORMAL HIGH (ref 70–99)
Potassium: 2.8 mmol/L — ABNORMAL LOW (ref 3.5–5.1)
Potassium: 3.6 mmol/L (ref 3.5–5.1)
Sodium: 140 mmol/L (ref 135–145)
Sodium: 138 mmol/L (ref 135–145)

## 2022-09-22 LAB — TYPE AND SCREEN
ABO/RH(D): O POS
Antibody Screen: NEGATIVE
Unit division: 0

## 2022-09-22 LAB — CBC
HCT: 28.4 % — ABNORMAL LOW (ref 39.0–52.0)
Hemoglobin: 8.6 g/dL — ABNORMAL LOW (ref 13.0–17.0)
MCH: 26.3 pg (ref 26.0–34.0)
MCHC: 30.3 g/dL (ref 30.0–36.0)
MCV: 86.9 fL (ref 80.0–100.0)
Platelets: 360 10*3/uL (ref 150–400)
RBC: 3.27 MIL/uL — ABNORMAL LOW (ref 4.22–5.81)
RDW: 19.2 % — ABNORMAL HIGH (ref 11.5–15.5)
WBC: 8.6 10*3/uL (ref 4.0–10.5)
nRBC: 0 % (ref 0.0–0.2)

## 2022-09-22 LAB — GLUCOSE, CAPILLARY
Glucose-Capillary: 67 mg/dL — ABNORMAL LOW (ref 70–99)
Glucose-Capillary: 90 mg/dL (ref 70–99)
Glucose-Capillary: 85 mg/dL (ref 70–99)
Glucose-Capillary: 107 mg/dL — ABNORMAL HIGH (ref 70–99)
Glucose-Capillary: 97 mg/dL (ref 70–99)
Glucose-Capillary: 84 mg/dL (ref 70–99)
Glucose-Capillary: 100 mg/dL — ABNORMAL HIGH (ref 70–99)

## 2022-09-22 LAB — PHOSPHORUS: Phosphorus: 4.1 mg/dL (ref 2.5–4.6)

## 2022-09-22 LAB — BPAM RBC
Blood Product Expiration Date: 202409252359
ISSUE DATE / TIME: 202409011725
Unit Type and Rh: 5100

## 2022-09-22 LAB — MAGNESIUM: Magnesium: 2.1 mg/dL (ref 1.7–2.4)

## 2022-09-22 MED ORDER — STERILE WATER FOR INJECTION IJ SOLN
INTRAMUSCULAR | Status: AC
  Administered 2022-09-22: 5 mL
  Filled 2022-09-22: qty 10

## 2022-09-22 MED ORDER — FAMOTIDINE IN NACL 20-0.9 MG/50ML-% IV SOLN
20.0000 mg | Freq: Two times a day (BID) | INTRAVENOUS | Status: DC
  Administered 2022-09-22 (×2): 20 mg via INTRAVENOUS
  Filled 2022-09-22 (×2): qty 50

## 2022-09-22 MED ORDER — DEXTROSE 50 % IV SOLN
INTRAVENOUS | Status: AC
  Administered 2022-09-22: 12.5 g via INTRAVENOUS
  Filled 2022-09-22: qty 50

## 2022-09-22 MED ORDER — AMIODARONE IV BOLUS ONLY 150 MG/100ML
150.0000 mg | Freq: Once | INTRAVENOUS | Status: AC
  Administered 2022-09-22: 150 mg via INTRAVENOUS
  Filled 2022-09-22: qty 100

## 2022-09-22 MED ORDER — POTASSIUM CHLORIDE 10 MEQ/100ML IV SOLN
10.0000 meq | INTRAVENOUS | Status: AC
  Administered 2022-09-22 (×7): 10 meq via INTRAVENOUS
  Filled 2022-09-22 (×8): qty 100

## 2022-09-22 MED ORDER — DEXTROSE 5 % IV SOLN
200.0000 mg | Freq: Once | INTRAVENOUS | Status: DC
  Filled 2022-09-22: qty 4

## 2022-09-22 MED ORDER — DEXTROSE 50 % IV SOLN: 12.5000 g | Freq: Once | INTRAVENOUS | Status: AC

## 2022-09-22 MED ORDER — DEXTROSE IN LACTATED RINGERS 5 % IV SOLN: INTRAVENOUS | Status: AC

## 2022-09-22 NOTE — Progress Notes (Signed)
[facility] ADULT ICU REPLACEMENT PROTOCOL   The patient does apply for the [facility] Adult ICU Electrolyte Replacment Protocol based on the criteria listed below:   1.Exclusion criteria: TCTS, ECMO, Dialysis, and Myasthenia Gravis patients 2. Is GFR >/= 30 ml/min? Yes.    Patient's GFR today is >60 3. Is SCr </= 2? Yes.   Patient's SCr is 0.32 mg/dL 4. Did SCr increase >/= 0.5 in 24 hours? No. 5.Pt's weight >40kg  Yes.   6. Abnormal electrolyte(s): K+2.8  7. Electrolytes replaced per protocol 8.  Call MD STAT for K+ </= 2.5, Phos </= 1, or Mag </= 1 Physician:  Dr. Mechele Collin  Lolita Lenz 09/22/2022 3:46 AM

## 2022-09-22 NOTE — Progress Notes (Signed)
Initial Nutrition Assessment  DOCUMENTATION CODES:   Underweight, Severe malnutrition in context of chronic illness  INTERVENTION:   - Recommend post-pyloric Cortrak placement tomorrow, 09/23/22  Once Cortrak placed and post-pyloric placement is confirmed via x-ray, initiate enteral nutrition: - Start Osmolite 1.5 @ 20 ml/hr and advance rate by 10 ml every 8 hours to goal rate of 60 ml/hr (1440 ml/day)  Tube feeding regimen at goal rate provides 2160 kcal, 90 grams of protein, and 1097 ml of H2O.  Monitor magnesium, potassium, and phosphorus every 12 hours for at least 6 occurrences. MD to replete as needed, as pt is at risk for refeeding syndrome given severe malnutrition.  - Once enteral access obtained, recommend 1 packet Juven BID per tube to support wound healing, each packet provides 95 calories, 2.5 grams of protein (collagen), and 9.8 grams of carbohydrate (3 grams sugar)  - Recommend MVI with minerals daily per tube  NUTRITION DIAGNOSIS:   Severe Malnutrition related to chronic illness (COPD, chronic respiratory failure, HFrEF) as evidenced by severe fat depletion, severe muscle depletion.  GOAL:   Patient will meet greater than or equal to 90% of their needs  MONITOR:   Labs, Weight trends, Skin, I & O's  REASON FOR ASSESSMENT:   Ventilator    ASSESSMENT:   82 year old male who presented to the ED from [facility] on 9/01 for PEG tube replacement. PMH of COPD, chronic hypoxic/hypercapnic respiratory failure s/p trach, ventilator-dependence, PEG-dependence, PAF, HFrEF, CAD, hypothyroidism, GERD, prior MDRO infections.  9/01 - General Surgery replaced 24-French G-tube at bedside  Of note, G-tube replaced yesterday at bedside by General Surgery. G-tube still with significant leakage. G-tube site is very dilated per notes.  Discussed pt with RN. G-tube is not being utilized for medication administration at this time due to ongoing leakage.  Discussed pt with CCM MD.  Surgery is considering relocating G-tube site vs GJ tube placement for a period of time to allow other site to heal completely.  Pt is severely malnourished. Plan is for post-pyloric Cortrak placement tomorrow then initiation of enteral nutrition. If unable to initiate enteral nutrition tomorrow, recommend initiation of TPN given severity of malnutrition. Discussed the above with MD.  Pt potentially refeeding on D5 with hypokalemia. Receiving IV replacement. Given severity of malnutrition and unknown period of time without enteral nutrition, pt is at refeeding risk. Recommend intiating enteral nutrition at low rate and slowly advancing to goal.  Patient is on chronic ventilator support via trach Temp (24hrs), Avg:98.5 F (36.9 C), Min:98 F (36.7 C), Max:98.8 F (37.1 C)  Drips: D5 in LR: 75 ml/hr  Medications reviewed and include: IV pepcid, IV KCl 10 mEq x 8  Labs reviewed: potassium 2.8, chloride 92, hemoglobin 8.6 CBG's: 67-107 x 24 hours  UOP: 1100 ml x 24 hours I/O's: +524 ml since admit  NUTRITION - FOCUSED PHYSICAL EXAM:  Flowsheet Row Most Recent Value  Orbital Region Severe depletion  Upper Arm Region Severe depletion  Thoracic and Lumbar Region Moderate depletion  Buccal Region Severe depletion  Temple Region Severe depletion  Clavicle Bone Region Severe depletion  Clavicle and Acromion Bone Region Severe depletion  Scapular Bone Region Severe depletion  Dorsal Hand Severe depletion  Patellar Region Severe depletion  Anterior Thigh Region Severe depletion  Posterior Calf Region Severe depletion  Edema (RD Assessment) None  Hair Reviewed  Eyes Reviewed  Mouth Reviewed  Skin Reviewed  Nails Reviewed    Diet Order:   Diet Order  Diet NPO time specified  Diet effective now                   EDUCATION NEEDS:   No education needs have been identified at this time  Skin:  Skin Assessment: Skin Integrity Issues: Stage I: L ischial  tuberosity Unstageable: coccyx Other: abrasion LLE  Last BM:  09/22/22 large type 7  Height:   Ht Readings from Last 1 Encounters:  09/09/22 5\' 10"  (1.778 m)    Weight:   Wt Readings from Last 1 Encounters:  09/22/22 58 kg    Ideal Body Weight:  75.5 kg  BMI:  Body mass index is 18.35 kg/m.  Estimated Nutritional Needs:   Kcal:  1800-2000  Protein:  85-100 grams  Fluid:  1.8-2.0 L    Mertie Clause, MS, RD, LDN Registered Dietitian II Please see AMiON for contact information.

## 2022-09-22 NOTE — Progress Notes (Signed)
NAME:  Austin Davis, MRN:  [id number], DOB:  1940-02-10, LOS: 1 ADMISSION DATE:  09/21/2022, CONSULTATION DATE:  09/21/2022 REFERRING MD:  Dr. Rush Landmark - EDP, CHIEF COMPLAINT: Malfunctioning PEG tube  History of Present Illness:  Austin Davis is a 82 year old male who presented to the ED via EMS from [facility] 9/1 with recurrent PEG tube dislodgment.  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  General surgery consulted for assistance in managing PEG tube dislodgment.  Of note patient was just admitted at this facility 8/20 to 8/21 with similar presentation of PEG tube dislodgment.  On ED admission patient was seen hemodynamically stable with borderline low blood pressure.  Oxygen saturations are 100% on 40% FiO2 via ventilator through tracheostomy.  Lab work significant for CO2 41 hemoglobin 6.9, hematocrit 24.0, WBC 12.1.  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 Dossie Der  Interim History / Subjective:   PEG replaced on 9/1, a lot of leakage around it and a very dilated site Atrial fibrillation, relative hypotension reported Severe hypokalemia, replacing 9/2 Received 1 unit PRBC on 9/1  Objective   Blood pressure (!) 75/64, pulse (!) 112, temperature 98.5 F (36.9 C), temperature source Oral, resp. rate (!) 26, weight 58 kg, SpO2 95%.    Vent Mode: PCV FiO2 (%):  [40 %] 40 % Set Rate:  [24  bmp-26 bmp] 26 bmp PEEP:  [5 cmH20] 5 cmH20 Plateau Pressure:  [25 cmH20-28 cmH20] 25 cmH20   Intake/Output Summary (Last 24 hours) at 09/22/2022 0820 Last data filed at 09/22/2022 0700 Gross per 24 hour  Intake 630.89 ml  Output 1100 ml  Net -469.11 ml   Filed Weights   09/22/22 0500  Weight: 58 kg    Examination: General: Thin ill-appearing man, ventilated HEENT: Trach midline, MM pink/moist, PERRL,  Neuro: Eyes open, turns to voice and tries to mouth responses to questions.  He has a resting facial tremor.  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 Dossie Der -Because the G-tube site is very dilated unclear that it will remain in place.  May have to consider relocating to a new site, question GJ tube for a period of time to allow the other site to heal completely  Leukocytosis, resolved P: -Follow CBC, fever curve, for any evidence of infection -No clear indication for antibiotics or to perform cultures at this time  Paroxysmal atrial fibrillation, not on anticoagulation outpatient CAD with chronic HFrEF Hypertension P: -  Not on outpatient anticoagulation -Borderline hypotension, holding amlodipine given marginal blood pressure here -Restart amiodarone IV until  we can use his PEG tube  Hypokalemia P: -Replace aggressively, recheck p.m. 9/2  Hypothyroidism P: -Holding levothyroxine currently until PEG access obtained.  If he will be unable to receive for several days then we will give him IV dosing  Anemia P: -Following CBC -Hemoglobin goal 7.0    Best Practice (right click and "Reselect all SmartList Selections" daily)   Diet/type: NPO DVT prophylaxis: prophylactic heparin  GI prophylaxis: PPI Lines: N/A Foley:  N/A Code Status:  full code Last date of multidisciplinary goals of care discussion: Pending   Labs   CBC: Recent Labs  Lab 09/21/22 1454 09/21/22 1612 09/21/22 1649 09/21/22 2233 09/22/22 0228  WBC 12.1*  --  11.6*  --  8.6  NEUTROABS 10.3*  --   --   --   --   HGB 6.9* 8.5* 7.4* 8.6* 8.6*  HCT 24.0* 25.0* 25.7* 28.6* 28.4*  MCV 88.9  --  87.1  --  86.9  PLT 361  --  370  --  360    Basic Metabolic Panel: Recent Labs  Lab 09/21/22 1454 09/21/22 1612 09/21/22 1649 09/22/22 0228  NA 138 139  --  140  K 3.8 4.1  --  2.8*  CL 86*  --   --  92*  CO2 41*  --   --  38*  GLUCOSE 100*  --   --  76  BUN 27*  --   --  22  CREATININE <0.30*  --  0.32* 0.32*  CALCIUM 8.0*  --   --  8.3*  MG  --   --   --  2.1  PHOS  --   --   --  4.1   GFR: Estimated Creatinine Clearance: 59.4 mL/min (A) (by C-G formula based on SCr of 0.32 mg/dL (L)). Recent Labs  Lab 09/21/22 1454 09/21/22 1649 09/22/22 0228  WBC 12.1* 11.6* 8.6  LATICACIDVEN 0.6 1.0  --     Liver Function Tests: Recent Labs  Lab 09/21/22 1454  AST 24  ALT 25  ALKPHOS 81  BILITOT 0.4  PROT 6.6  ALBUMIN 1.7*   Recent Labs  Lab 09/21/22 1454  LIPASE 23   No results for input(s): "AMMONIA" in the last 168 hours.  ABG    Component Value Date/Time   PHART 7.388 09/21/2022 1612   PCO2ART 89.3 (HH) 09/21/2022 1612   PO2ART 69 (L) 09/21/2022 1612   HCO3 53.8 (H) 09/21/2022 1612   TCO2 >50 (H) 09/21/2022 1612   ACIDBASEDEF 2.0  05/30/2021 0052   O2SAT 91 09/21/2022 1612     Coagulation Profile: No results for input(s): "INR", "PROTIME" in the last 168 hours.  Cardiac Enzymes: No results for input(s): "CKTOTAL", "CKMB", "CKMBINDEX", "TROPONINI" in the last 168 hours.  HbA1C: Hgb A1c MFr Bld  Date/Time Value Ref Range Status  01/04/2022 11:22 PM 5.3 4.8 - 5.6 % Final    Comment:    (NOTE)         Prediabetes: 5.7 - 6.4         Diabetes: >6.4         Glycemic control for adults with diabetes: <7.0   04/11/2021 09:12 PM 5.5 4.8 - 5.6 % Final    Comment:    (NOTE) Pre diabetes:          5.7%-6.4%  Diabetes:              >  6.4%  Glycemic control for   <7.0% adults with diabetes     CBG: Recent Labs  Lab 09/21/22 1916 09/21/22 2311 09/22/22 0257 09/22/22 0334 09/22/22 0717  GLUCAP 79 73 67* 90 85     Critical care time:   CRITICAL CARE Performed by: Leslye Peer  Total critical care time: 32 minutes  Levy Pupa, MD, PhD 09/22/2022, 8:20 AM [facility] [phone] or if no answer before 7:00PM call [phone] For any issues after 7:00PM please call eLink [phone]

## 2022-09-23 DIAGNOSIS — Z9911 Dependence on respirator [ventilator] status: Secondary | ICD-10-CM | POA: Diagnosis not present

## 2022-09-23 LAB — BASIC METABOLIC PANEL
Anion gap: 8 (ref 5–15)
BUN: 20 mg/dL (ref 8–23)
CO2: 32 mmol/L (ref 22–32)
Calcium: 8.9 mg/dL (ref 8.9–10.3)
Chloride: 98 mmol/L (ref 98–111)
Creatinine, Ser: 0.33 mg/dL — ABNORMAL LOW (ref 0.61–1.24)
GFR, Estimated: >60 mL/min (ref >60)
Glucose, Bld: 159 mg/dL — ABNORMAL HIGH (ref 70–99)
Potassium: 3.6 mmol/L (ref 3.5–5.1)
Sodium: 138 mmol/L (ref 135–145)

## 2022-09-23 LAB — GLUCOSE, CAPILLARY
Glucose-Capillary: 154 mg/dL — ABNORMAL HIGH (ref 70–99)
Glucose-Capillary: 135 mg/dL — ABNORMAL HIGH (ref 70–99)
Glucose-Capillary: 123 mg/dL — ABNORMAL HIGH (ref 70–99)
Glucose-Capillary: 131 mg/dL — ABNORMAL HIGH (ref 70–99)
Glucose-Capillary: 125 mg/dL — ABNORMAL HIGH (ref 70–99)
Glucose-Capillary: 116 mg/dL — ABNORMAL HIGH (ref 70–99)

## 2022-09-23 LAB — CBC
HCT: 32.2 % — ABNORMAL LOW (ref 39.0–52.0)
Hemoglobin: 9.4 g/dL — ABNORMAL LOW (ref 13.0–17.0)
MCH: 25.3 pg — ABNORMAL LOW (ref 26.0–34.0)
MCHC: 29.2 g/dL — ABNORMAL LOW (ref 30.0–36.0)
MCV: 86.8 fL (ref 80.0–100.0)
Platelets: 505 10*3/uL — ABNORMAL HIGH (ref 150–400)
RBC: 3.71 MIL/uL — ABNORMAL LOW (ref 4.22–5.81)
RDW: 19.4 % — ABNORMAL HIGH (ref 11.5–15.5)
WBC: 12.8 10*3/uL — ABNORMAL HIGH (ref 4.0–10.5)
nRBC: 0 % (ref 0.0–0.2)

## 2022-09-23 LAB — PHOSPHORUS: Phosphorus: 4.9 mg/dL — ABNORMAL HIGH (ref 2.5–4.6)

## 2022-09-23 LAB — MAGNESIUM: Magnesium: 2.1 mg/dL (ref 1.7–2.4)

## 2022-09-23 MED ORDER — POTASSIUM CHLORIDE 10 MEQ/100ML IV SOLN
10.0000 meq | INTRAVENOUS | Status: AC
  Administered 2022-09-23 (×4): 10 meq via INTRAVENOUS
  Filled 2022-09-23 (×4): qty 100

## 2022-09-23 MED ORDER — PANTOPRAZOLE SODIUM 40 MG IV SOLR
40.0000 mg | Freq: Every day | INTRAVENOUS | Status: DC
  Administered 2022-09-23 – 2022-09-26 (×4): 40 mg via INTRAVENOUS
  Filled 2022-09-23 (×4): qty 10

## 2022-09-23 MED ORDER — METOCLOPRAMIDE HCL 5 MG/ML IJ SOLN
INTRAMUSCULAR | Status: AC
  Filled 2022-09-23: qty 2

## 2022-09-23 NOTE — Progress Notes (Addendum)
NAME:  Austin Davis, MRN:  [id number], DOB:  10/23/1940, LOS: 2 ADMISSION DATE:  09/21/2022, CONSULTATION DATE:  09/21/2022 REFERRING MD:  Dr. Rush Landmark - EDP, CHIEF COMPLAINT: Malfunctioning PEG tube  History of Present Illness:  Austin Davis is a 82 year old male who presented to the ED via EMS from [facility] 9/1 with recurrent PEG tube dislodgment.  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  General surgery consulted for assistance in managing PEG tube dislodgment.  Of note patient was just admitted at this facility 8/20 to 8/21 with similar presentation of PEG tube dislodgment.  On ED admission patient was seen hemodynamically stable with borderline low blood pressure.  Oxygen saturations are 100% on 40% FiO2 via ventilator through tracheostomy.  Lab work significant for CO2 41 hemoglobin 6.9, hematocrit 24.0, WBC 12.1.  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 Dossie Der  Interim History / Subjective:  PCV 26, 0.40, PEEP 5 I/O+ 1 L total K 3.6 WBC 12.8  Objective   Blood pressure 106/74, pulse 78, temperature 97.9 F (36.6 C), temperature source Oral, resp. rate (!) 26, weight 54.2 kg, SpO2 100%.    Vent Mode: PCV FiO2 (%):  [40 %] 40 % Set Rate:  [26 bmp] 26 bmp PEEP:  [5 cmH20] 5 cmH20 Plateau Pressure:  [25 cmH20-29 cmH20] 26 cmH20   Intake/Output Summary (Last 24 hours) at 09/23/2022  0716 Last data filed at 09/23/2022 0600 Gross per 24 hour  Intake 2282.96 ml  Output 800 ml  Net 1482.96 ml   Filed Weights   09/22/22 0500 09/23/22 0500  Weight: 58 kg 54.2 kg    Examination: General: Thin ill-appearing man, ventilated HEENT: Trach midline, MM pink/moist, PERRL,  Neuro: Eyes open, turns to voice and tries to mouth responses to questions.  He has a resting facial tremor.  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 Dossie Der -Will trial enteral nutrition when/if okay with CCS. goal is to place GJ tube, probably through the existing PEG site to see if this will allow healing of that site and tighter fit.  Next option would be moving to a new site with a GJ tube to allow closure of the existing site. -If unable to organize GJ tube on 9/3 then we will temporarily place core track and postpyloric position to allow nutrition  Leukocytosis, resolved P: -Follow CBC, fever curve for any evidence of infection -No clear indication for antibiotics, cultures at this time  Paroxysmal atrial fibrillation, not on anticoagulation outpatient CAD with chronic HFrEF Hypertension P: -Not on outpatient anticoagulation -Restart amlodipine if his blood pressure will tolerate and once we have enteral  access -Amiodarone IV until we can use his PEG tube will core track  Hypokalemia P: -Improved, continue to follow and replete as indicated  Hypothyroidism P: -Levothyroxine currently on hold, restart when we have enteral access.  Will change IV if he is going to be off for an extended time  Acute on chronic blood loss anemia P: -Following CBC -Hemoglobin goal 7.0  Severe protein calorie malnutrition P: -Plan to reinitiate TF once we have postpyloric enteral access    Best Practice (right click and "Reselect all SmartList Selections" daily)   Diet/type: NPO DVT prophylaxis: prophylactic heparin  GI prophylaxis: PPI Lines: N/A Foley:  N/A Code Status:  full code Last date of multidisciplinary goals of care discussion: Pending   Labs   CBC: Recent Labs  Lab 09/21/22 1454 09/21/22 1612 09/21/22 1649 09/21/22 2233 09/22/22 0228 09/23/22 0317  WBC 12.1*  --  11.6*  --  8.6 12.8*  NEUTROABS 10.3*  --   --   --   --   --   HGB 6.9* 8.5* 7.4* 8.6* 8.6* 9.4*  HCT 24.0* 25.0* 25.7* 28.6* 28.4* 32.2*  MCV 88.9  --  87.1  --  86.9 86.8  PLT 361  --  370  --  360 505*    Basic Metabolic Panel: Recent Labs  Lab 09/21/22 1454 09/21/22 1612 09/21/22 1649 09/22/22 0228 09/22/22 1910 09/23/22 0317  NA 138 139  --  140 138 138  K 3.8 4.1  --  2.8* 3.6 3.6  CL 86*  --   --  92* 97* 98  CO2 41*  --   --  38* 30 32  GLUCOSE 100*  --   --  76 106* 159*  BUN 27*  --   --  22 18 20   CREATININE <0.30*  --  0.32* 0.32* <0.30* 0.33*  CALCIUM 8.0*  --   --  8.3* 8.4* 8.9  MG  --   --   --  2.1  --  2.1  PHOS  --   --   --  4.1  --  4.9*   GFR: Estimated Creatinine Clearance: 55.5 mL/min (A) (by C-G formula based on SCr of 0.33 mg/dL (L)). Recent Labs  Lab 09/21/22 1454 09/21/22 1649 09/22/22 0228 09/23/22 0317  WBC 12.1* 11.6* 8.6 12.8*  LATICACIDVEN 0.6 1.0  --   --     Liver Function Tests: Recent Labs  Lab 09/21/22 1454  AST 24  ALT 25  ALKPHOS 81   BILITOT 0.4  PROT 6.6  ALBUMIN 1.7*   Recent Labs  Lab 09/21/22 1454  LIPASE 23   No results for input(s): "AMMONIA" in the last 168 hours.  ABG    Component Value Date/Time   PHART 7.388 09/21/2022 1612   PCO2ART 89.3 (HH) 09/21/2022 1612   PO2ART 69 (L) 09/21/2022 1612   HCO3 53.8 (H) 09/21/2022 1612   TCO2 >50 (H) 09/21/2022 1612   ACIDBASEDEF 2.0 05/30/2021 0052   O2SAT 91 09/21/2022 1612     Coagulation Profile: No results for input(s): "INR", "PROTIME" in the last 168 hours.  Cardiac Enzymes: No results for input(s): "CKTOTAL", "CKMB", "CKMBINDEX", "TROPONINI" in the last 168 hours.  HbA1C: Hgb A1c MFr Bld  Date/Time Value Ref Range Status  01/04/2022 11:22 PM 5.3 4.8 - 5.6 %  Final    Comment:    (NOTE)         Prediabetes: 5.7 - 6.4         Diabetes: >6.4         Glycemic control for adults with diabetes: <7.0   04/11/2021 09:12 PM 5.5 4.8 - 5.6 % Final    Comment:    (NOTE) Pre diabetes:          5.7%-6.4%  Diabetes:              >6.4%  Glycemic control for   <7.0% adults with diabetes     CBG: Recent Labs  Lab 09/22/22 1108 09/22/22 1504 09/22/22 1924 09/22/22 2313 09/23/22 0316  GLUCAP 107* 97 84 100* 154*     Critical care time:   CRITICAL CARE Performed by: Leslye Peer  Total critical care time: NA  Levy Pupa, MD, PhD 09/23/2022, 7:16 AM [facility] [phone] or if no answer before 7:00PM call [phone] For any issues after 7:00PM please call eLink [phone]

## 2022-09-23 NOTE — Progress Notes (Signed)
Brief Nutrition Support Note  Cortrak team attempted post-pyloric Cortrak placement this morning; however, placement unsuccessful as Cortrak tube kept coming out of dilated G-tube site. Discussed with PCCM who requested Cortrak attempt be aborted.  Discussed pt with Surgery PA. Plan is for IR to exchange G-tube for a GJ tube. Per Surgery PA, IR is going to try to fit pt into their schedule for GJ tube placement today.  Discussed pt further with PCCM who would like to hold off on post-pyloric small-bore feeding tube placement under fluoroscopy since GJ placement is anticipated either today or tomorrow. PCCM would also like to avoid TPN at this time.  Once GJ tube placed and cleared for use, recommend initiating enteral nutrition via J-port: - Start Osmolite 1.5 @ 20 ml/hr and advance rate by 10 ml every 8 hours to goal rate of 60 ml/hr (1440 ml/day)   Tube feeding regimen at goal rate provides 2160 kcal, 90 grams of protein, and 1097 ml of H2O.   Monitor magnesium, potassium, and phosphorus every 12 hours for at least 6 occurrences. MD to replete as needed, as pt is at risk for refeeding syndrome given severe malnutrition.  If unable to obtain reliable enteral access and initiate enteral nutrition within next 24 hours, recommend initiation of TPN given severity of malnutrition.  RD will continue to follow pt during admission.   Mertie Clause, MS, RD, LDN Registered Dietitian II Please see AMiON for contact information.

## 2022-09-23 NOTE — Progress Notes (Signed)
Cortrak Tube Team Note:  Consult received to place a Cortrak feeding tube. Placement unsuccessful as Cortrak tube coming out G-tube site. MD notified and request to abort placement. Cortrak removed. RN notified at end of placement.     Kirby Crigler RD, LDN Clinical Dietitian See Loretha Stapler for contact information.

## 2022-09-23 NOTE — Plan of Care (Addendum)
Brief Plan of Care Note: Request received for conversion of existing gastrostomy tube to gastrojejunostomy tube. Unfortunately, IR APPs have been unable to reach patient's legal guardian through DSS for consent for procedure. A voicemail message has been left for DSS staff to return call to IR APPs so that consent may be obtained. Plan to keep patient NPO for 09/24/22 and IR team will attempt to reach DSS for consent again on 09/24/22.   Addendum Patient's legal guardian is no longer [employer] as listed in chart. Attempted to reach Rodena Medin, manager of DSS Adult Services to determine who is patient's legal guardian, but again a voicemail message had to be left as Mr Nedra Hai did not answer. Will attempt to reach Mr Nedra Hai again on 09/24/22 for consent for procedure.  Austin Francois, PA-C 09/23/2022 3:22 PM

## 2022-09-23 NOTE — Progress Notes (Signed)
[facility] Progress Note     Subjective: CC-  Nods and mouths appropriately to questions. Denies abdominal pain.  Objective: Vital signs in last 24 hours: Temp:  [97.5 F (36.4 C)-98.4 F (36.9 C)] 97.5 F (36.4 C) (09/03 0754) Pulse Rate:  [71-120] 80 (09/03 0830) Resp:  [19-27] 19 (09/03 0830) BP: (88-162)/(63-95) 116/74 (09/03 0830) SpO2:  [90 %-100 %] 98 % (09/03 0830) FiO2 (%):  [40 %] 40 % (09/03 0756) Weight:  [54.2 kg] 54.2 kg (09/03 0500) Last BM Date : 09/22/22  Intake/Output from previous day: 09/02 0701 - 09/03 0700 In: 2657.9 [I.V.:2035.1; IV Piggyback:622.8] Out: 800 [Urine:800] Intake/Output this shift: Total I/O In: 74.6 [I.V.:74.6] Out: -   PE: Gen: Alert, NAD HEENT: trach Pulm:  mechanically ventilated Abd: Soft, ND, nontender, G tube clamped/ site with larger hole at the skin than the diameter of the G tube/ surrounding skin is erythematous   Lab Results:  Recent Labs    09/22/22 0228 09/23/22 0317  WBC 8.6 12.8*  HGB 8.6* 9.4*  HCT 28.4* 32.2*  PLT 360 505*   BMET Recent Labs    09/22/22 1910 09/23/22 0317  NA 138 138  K 3.6 3.6  CL 97* 98  CO2 30 32  GLUCOSE 106* 159*  BUN 18 20  CREATININE <0.30* 0.33*  CALCIUM 8.4* 8.9   PT/INR No results for input(s): "LABPROT", "INR" in the last 72 hours. CMP     Component Value Date/Time   NA 138 09/23/2022 0317   NA 143 04/06/2020 1555   K 3.6 09/23/2022 0317   CL 98 09/23/2022 0317   CO2 32 09/23/2022 0317   GLUCOSE 159 (H) 09/23/2022 0317   BUN 20 09/23/2022 0317   BUN 21 04/06/2020 1555   CREATININE 0.33 (L) 09/23/2022 0317   CREATININE 0.97 01/30/2015 1115   CALCIUM 8.9 09/23/2022 0317   PROT 6.6 09/21/2022 1454   PROT 6.7 04/06/2020 1555   ALBUMIN 1.7 (L) 09/21/2022 1454   ALBUMIN 4.7 04/06/2020 1555   AST 24 09/21/2022 1454   ALT 25 09/21/2022 1454   ALKPHOS 81 09/21/2022 1454   BILITOT 0.4 09/21/2022 1454   BILITOT 0.3 04/06/2020 1555   GFRNONAA >60  09/23/2022 0317   GFRAA >60 02/17/2017 0145   Lipase     Component Value Date/Time   LIPASE 23 09/21/2022 1454       Studies/Results: DG ABDOMEN PEG TUBE LOCATION  Result Date: 09/21/2022 CLINICAL DATA:  Gastrostomy tube dysfunction EXAM: ABDOMEN - 1 VIEW COMPARISON:  09/09/2022 FINDINGS: Supine frontal view of the abdomen and pelvis was performed. Gastrostomy tube is identified within the gastric antral lumen. Oral contrast is seen within the stomach and small bowel. No evidence of obstruction or ileus. IVC filter again noted. IMPRESSION: 1. Gastrostomy tube within the stomach. 2. Oral contrast throughout the stomach and small bowel, without evidence of obstruction. Electronically Signed   By: Sharlet Salina M.D.   On: 09/21/2022 21:07    Anti-infectives: Anti-infectives (From admission, onward)    None        Assessment/Plan Dislodged G-tube - replaced  9/1 by Dr. Dossie Der, in good position on follow up film Leaking G-tube - Ongoing issue. Exchanged/upsized multiple times by IR, now with 82F G-tube. G tube site is still larger than the tube. Discussed with IR who will review and see if exchanging for GJ may be an option to allow G to vent and use J for feeding; this may help decrease drainage  to allow wound to close. Other, more invasive, option would be to place new G tube at a new site to allow this site to heal. Will discuss further with MD.  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    LOS: 2 days    Franne Forts, [facility] 09/23/2022, 9:13 AM Please see Amion for pager number during day hours 7:00am-4:30pm

## 2022-09-24 ENCOUNTER — Inpatient Hospital Stay (HOSPITAL_COMMUNITY): Payer: Medicare Other

## 2022-09-24 DIAGNOSIS — Z9911 Dependence on respirator [ventilator] status: Secondary | ICD-10-CM | POA: Diagnosis not present

## 2022-09-24 HISTORY — PX: IR GJ TUBE CHANGE: IMG1440

## 2022-09-24 HISTORY — PX: IR GASTR TUBE CONVERT GASTR-JEJ PER W/FL MOD SED: IMG2332

## 2022-09-24 LAB — BASIC METABOLIC PANEL
Anion gap: 12 (ref 5–15)
BUN: 10 mg/dL (ref 8–23)
CO2: 33 mmol/L — ABNORMAL HIGH (ref 22–32)
Calcium: 8.9 mg/dL (ref 8.9–10.3)
Chloride: 98 mmol/L (ref 98–111)
Creatinine, Ser: <0.3 mg/dL — ABNORMAL LOW (ref 0.61–1.24)
Glucose, Bld: 132 mg/dL — ABNORMAL HIGH (ref 70–99)
Potassium: 4 mmol/L (ref 3.5–5.1)
Sodium: 143 mmol/L (ref 135–145)

## 2022-09-24 LAB — CBC WITH DIFFERENTIAL/PLATELET
Abs Immature Granulocytes: 0.31 10*3/uL — ABNORMAL HIGH (ref 0.00–0.07)
Basophils Absolute: 0.1 10*3/uL (ref 0.0–0.1)
Basophils Relative: 0 %
Eosinophils Absolute: 0.1 10*3/uL (ref 0.0–0.5)
Eosinophils Relative: 0 %
HCT: 32 % — ABNORMAL LOW (ref 39.0–52.0)
Hemoglobin: 9.4 g/dL — ABNORMAL LOW (ref 13.0–17.0)
Immature Granulocytes: 2 %
Lymphocytes Relative: 1 %
Lymphs Abs: 0.2 10*3/uL — ABNORMAL LOW (ref 0.7–4.0)
MCH: 26.4 pg (ref 26.0–34.0)
MCHC: 29.4 g/dL — ABNORMAL LOW (ref 30.0–36.0)
MCV: 89.9 fL (ref 80.0–100.0)
Monocytes Absolute: 0.3 10*3/uL (ref 0.1–1.0)
Monocytes Relative: 2 %
Neutro Abs: 20 10*3/uL — ABNORMAL HIGH (ref 1.7–7.7)
Neutrophils Relative %: 95 %
Platelets: 368 10*3/uL (ref 150–400)
RBC: 3.56 MIL/uL — ABNORMAL LOW (ref 4.22–5.81)
RDW: 19.4 % — ABNORMAL HIGH (ref 11.5–15.5)
WBC: 20.9 10*3/uL — ABNORMAL HIGH (ref 4.0–10.5)
nRBC: 0.1 % (ref 0.0–0.2)

## 2022-09-24 LAB — GLUCOSE, CAPILLARY
Glucose-Capillary: 93 mg/dL (ref 70–99)
Glucose-Capillary: 120 mg/dL — ABNORMAL HIGH (ref 70–99)
Glucose-Capillary: 109 mg/dL — ABNORMAL HIGH (ref 70–99)
Glucose-Capillary: 100 mg/dL — ABNORMAL HIGH (ref 70–99)
Glucose-Capillary: 80 mg/dL (ref 70–99)
Glucose-Capillary: 76 mg/dL (ref 70–99)
Glucose-Capillary: 115 mg/dL — ABNORMAL HIGH (ref 70–99)

## 2022-09-24 LAB — MAGNESIUM: Magnesium: 1.9 mg/dL (ref 1.7–2.4)

## 2022-09-24 LAB — PHOSPHORUS: Phosphorus: 3.2 mg/dL (ref 2.5–4.6)

## 2022-09-24 MED ORDER — LORAZEPAM 2 MG/ML IJ SOLN
0.5000 mg | Freq: Once | INTRAMUSCULAR | Status: AC
  Administered 2022-09-24: 0.5 mg via INTRAVENOUS
  Filled 2022-09-24: qty 1

## 2022-09-24 MED ORDER — IOHEXOL 300 MG/ML  SOLN
50.0000 mL | Freq: Once | INTRAMUSCULAR | Status: AC | PRN
  Administered 2022-09-24: 7 mL

## 2022-09-24 MED ORDER — OSMOLITE 1.5 CAL PO LIQD
1000.0000 mL | ORAL | Status: DC
  Administered 2022-09-24 – 2022-09-26 (×3): 1000 mL
  Filled 2022-09-24 (×4): qty 1000

## 2022-09-24 MED ORDER — DEXTROSE IN LACTATED RINGERS 5 % IV SOLN: INTRAVENOUS | Status: DC

## 2022-09-24 MED ORDER — LIDOCAINE VISCOUS HCL 2 % MT SOLN
OROMUCOSAL | Status: AC
  Filled 2022-09-24: qty 15

## 2022-09-24 MED ORDER — JUVEN PO PACK
1.0000 | PACK | Freq: Two times a day (BID) | ORAL | Status: DC
  Administered 2022-09-25 – 2022-09-26 (×4): 1
  Filled 2022-09-24 (×4): qty 1

## 2022-09-24 MED ORDER — ADULT MULTIVITAMIN W/MINERALS CH
1.0000 | ORAL_TABLET | Freq: Every day | ORAL | Status: DC
  Administered 2022-09-25 – 2022-09-26 (×2): 1
  Filled 2022-09-24 (×2): qty 1

## 2022-09-24 NOTE — Procedures (Signed)
Interventional Radiology Procedure Note  Procedure: Gastrostomy to gastrojejunostomy conversion  Findings: Please refer to procedural dictation for full description. 24 Fr gastrojejunostomy in place, distal tip in proximal jejunum.  Complications: None immediate  Estimated Blood Loss: < 5 ml  Recommendations: Tube ready for immediate use.   Marliss Coots, MD

## 2022-09-24 NOTE — Progress Notes (Addendum)
Nutrition Follow-up  DOCUMENTATION CODES:   Underweight, Severe malnutrition in context of chronic illness  INTERVENTION:   ADDENDUM (1601): Consult received for enteral nutrition initiation and management. Per Radiology MD note, gastrojejunostomy tube is ready for immediate use. Will order tube feeding recommendations below.  Recommend initiation of enteral nutrition via J-port of GJ tube: - Start Osmolite 1.5 @ 20 ml/hr and advance rate by 10 ml every 8 hours to goal rate of 60 ml/hr (1440 ml/day)  Tube feeding regimen at goal rate provides 2160 kcal, 90 grams of protein, and 1097 ml of H2O.  - Recommend 1 packet Juven BID per tube to support wound healing, each packet provides 95 calories, 2.5 grams of protein (collagen), and 9.8 grams of carbohydrate (3 grams sugar)   - Recommend MVI with minerals daily per tube  NUTRITION DIAGNOSIS:   Severe Malnutrition related to chronic illness (COPD, chronic respiratory failure, HFrEF) as evidenced by severe fat depletion, severe muscle depletion.  Ongoing  GOAL:   Patient will meet greater than or equal to 90% of their needs  Unmet at this time  MONITOR:   Labs, Weight trends, Skin, I & O's  REASON FOR ASSESSMENT:   Ventilator    ASSESSMENT:   82 year old male who presented to the ED from [facility] on 9/01 for PEG tube replacement. PMH of COPD, chronic hypoxic/hypercapnic respiratory failure s/p trach, ventilator-dependence, PEG-dependence, PAF, HFrEF, CAD, hypothyroidism, GERD, prior MDRO infections.  9/01 - General Surgery replaced 24-French G-tube at bedside 9/03 - post-pyloric Cortrak placement unsuccessful  Discussed pt with RN and during ICU rounds. IR to convert G-tube to GJ tube today. Per Surgery, plan is to allow G-port to vent and use J-port for feeding which will hopefully decrease drainage to allow wound to close.  Pt had just left the unit for IR at time of RD visit, around 1540. RD to leave tube feeding  recommendations above.  Admit weight: 58 kg Current weight: 54.6 kg  Patient is on chronic ventilator support via trach Temp (24hrs), Avg:98.5 F (36.9 C), Min:97.7 F (36.5 C), Max:99.2 F (37.3 C)  Drips: D5 in LR: 30 ml/hr  Medications reviewed and include: IV protonix  Labs reviewed: phosphorus 4.9 on 9/3, WBC 20.9, hemoglobin 9.4 CBG's: 100-131 x 24 hours  UOP: 1100 ml x 24 hours I/O's: +2.5 L since admit  Diet Order:   Diet Order             Diet NPO time specified  Diet effective now                   EDUCATION NEEDS:   No education needs have been identified at this time  Skin:  Skin Assessment: Skin Integrity Issues: Stage I: L ischial tuberosity Unstageable: coccyx Other: abrasion LLE  Last BM:  09/23/22 multiple type 7  Height:   Ht Readings from Last 1 Encounters:  09/09/22 5\' 10"  (1.778 m)    Weight:   Wt Readings from Last 1 Encounters:  09/24/22 54.6 kg    Ideal Body Weight:  75.5 kg  BMI:  Body mass index is 17.27 kg/m.  Estimated Nutritional Needs:   Kcal:  1800-2000  Protein:  85-100 grams  Fluid:  1.8-2.0 L    Mertie Clause, MS, RD, LDN Registered Dietitian II Please see AMiON for contact information.

## 2022-09-24 NOTE — Progress Notes (Signed)
Pt transported to IR and back to 65m15 without any complications.

## 2022-09-24 NOTE — Progress Notes (Signed)
eLink Physician-Brief Progress Note Patient Name: Austin Davis DOB: 04-23-1940 MRN: [id number]   Date of Service  09/24/2022  HPI/Events of Note  82 year old initially presented due to PEG dysfunction-PEG was replaced at bedside and waiting to change to GJ tube.  D5 LR has expired.  eICU Interventions  Renew D5 LR at lower rate     Intervention Category Minor Interventions: Routine modifications to care plan (e.g. PRN medications for pain, fever)  Jame Seelig 09/24/2022, 6:13 AM

## 2022-09-24 NOTE — Progress Notes (Signed)
NAME:  Austin Davis, MRN:  [id number], DOB:  [birth year], LOS: 3 ADMISSION DATE:  09/21/2022, CONSULTATION DATE:  09/21/2022 REFERRING MD:  Dr. Rush Landmark - EDP, CHIEF COMPLAINT: Malfunctioning PEG tube  History of Present Illness:  Austin Davis is a 82 year old male who presented to the ED via EMS from [facility] 9/1 with recurrent PEG tube dislodgment.  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  General surgery consulted for assistance in managing PEG tube dislodgment.  Of note patient was just admitted at this facility 8/20 to 8/21 with similar presentation of PEG tube dislodgment.  On ED admission patient was seen hemodynamically stable with borderline low blood pressure.  Oxygen saturations are 100% on 40% FiO2 via ventilator through tracheostomy.  Lab work significant for CO2 41 hemoglobin 6.9, hematocrit 24.0, WBC 12.1.  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 Dossie Der  Interim History / Subjective:  Pain is on pressure control ventilation, PC decreased to 24.  FiO2 0.40, PEEP 5. I/O+ 2.4 L total WBC 20.9 (from 12.8)  Objective   Blood pressure (!) 145/77, pulse 97, temperature 98.4 F (36.9 C), temperature source Oral, resp. rate (!) 26, weight 54.6 kg, SpO2 96%.    Vent Mode: PCV FiO2 (%):  [40 %] 40 % Set Rate:  [24 bmp-26 bmp] 24 bmp PEEP:  [5 cmH20] 5 cmH20 Plateau Pressure:  [22  cmH20-26 cmH20] 26 cmH20   Intake/Output Summary (Last 24 hours) at 09/24/2022 0725 Last data filed at 09/24/2022 0600 Gross per 24 hour  Intake 2124.64 ml  Output 1100 ml  Net 1024.64 ml   Filed Weights   09/22/22 0500 09/23/22 0500 09/24/22 0500  Weight: 58 kg 54.2 kg 54.6 kg    Examination: General: Thin man, comfortable, ventilated, awake HEENT: Trach midline, site is clean.  Oropharynx clear Neuro: Eyes open, turns to voice, nods to questions and attempts to mouth responses.  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  Hopefully this will allow healing of the existing site.  Last resort would be to move the site with the GJ in place which would require surgery  Leukocytosis, recurrent 9/4 P: -No clear evidence for infection, deferring antibiotics at this time -Check blood and sputum cultures on 9/4  Paroxysmal atrial fibrillation, not on anticoagulation outpatient CAD with chronic HFrEF Hypertension P: -Not on outpatient anticoagulation -Restart his amlodipine once we  have enteral access and if blood pressure will tolerate -Convert amiodarone back to enteral once we have access  Hypokalemia P: -Resolved, following  BMP  Hypothyroidism P: -Levothyroxine is currently on hold, restart when we have enteral access.  Should be able to restart later on 9/4  Acute on chronic blood loss anemia P: -Follow CBC -Hemoglobin goal 7.0  Severe protein calorie malnutrition P: -Restart TF once we have postpyloric enteral access    Best Practice (right click and "Reselect all SmartList Selections" daily)   Diet/type: NPO DVT prophylaxis: prophylactic heparin  GI prophylaxis: PPI Lines: N/A Foley:  N/A Code Status:  full code Last date of multidisciplinary goals of care discussion: Pending   Labs   CBC: Recent Labs  Lab 09/21/22 1454 09/21/22 1612 09/21/22 1649 09/21/22 2233 09/22/22 0228 09/23/22 0317 09/24/22 0322  WBC 12.1*  --  11.6*  --  8.6 12.8* 20.9*  NEUTROABS 10.3*  --   --   --   --   --  20.0*  HGB 6.9*   < > 7.4* 8.6* 8.6* 9.4* 9.4*  HCT 24.0*   < > 25.7* 28.6* 28.4* 32.2* 32.0*  MCV 88.9  --  87.1  --  86.9 86.8 89.9  PLT 361  --  370  --  360 505* 368   < > = values in this interval not displayed.    Basic Metabolic Panel: Recent Labs  Lab 09/21/22 1454 09/21/22 1612 09/21/22 1649 09/22/22 0228 09/22/22 1910 09/23/22 0317 09/23/22 2300  NA 138 139  --  140 138 138 143  K 3.8 4.1  --  2.8* 3.6 3.6 4.0  CL 86*  --   --  92* 97* 98 98  CO2 41*  --   --  38* 30 32 33*  GLUCOSE 100*  --   --  76 106* 159* 132*  BUN 27*  --   --  22 18 20 10   CREATININE <0.30*  --  0.32* 0.32* <0.30* 0.33* <0.30*  CALCIUM 8.0*  --   --  8.3* 8.4* 8.9 8.9  MG  --   --   --  2.1  --  2.1  --   PHOS  --   --   --  4.1  --  4.9*  --    GFR: CrCl cannot be calculated (This lab value cannot be used to calculate CrCl because it is not a number: <0.30). Recent Labs  Lab 09/21/22 1454 09/21/22 1649 09/22/22 0228 09/23/22 0317 09/24/22 0322  WBC 12.1* 11.6* 8.6 12.8* 20.9*  LATICACIDVEN 0.6 1.0  --   --   --     Liver Function Tests: Recent Labs  Lab 09/21/22 1454  AST 24  ALT 25   ALKPHOS 81  BILITOT 0.4  PROT 6.6  ALBUMIN 1.7*   Recent Labs  Lab 09/21/22 1454  LIPASE 23   No results for input(s): "AMMONIA" in the last 168 hours.  ABG    Component Value Date/Time   PHART 7.388 09/21/2022 1612   PCO2ART 89.3 (HH) 09/21/2022 1612   PO2ART 69 (L) 09/21/2022 1612   HCO3 53.8 (H) 09/21/2022 1612   TCO2 >50 (H) 09/21/2022 1612   ACIDBASEDEF 2.0 05/30/2021 0052   O2SAT 91 09/21/2022 1612     Coagulation Profile: No results for input(s): "INR", "PROTIME" in the last 168 hours.  Cardiac Enzymes: No results for input(s): "CKTOTAL", "CKMB", "CKMBINDEX", "TROPONINI" in the last 168 hours.  HbA1C: Hgb A1c MFr Bld  Date/Time Value Ref Range Status  01/04/2022 11:22 PM 5.3 4.8 - 5.6 % Final    Comment:    (NOTE)         Prediabetes: 5.7 - 6.4         Diabetes: >6.4         Glycemic control for adults with diabetes: <7.0   04/11/2021 09:12 PM 5.5 4.8 - 5.6 % Final    Comment:    (NOTE) Pre diabetes:          5.7%-6.4%  Diabetes:              >6.4%  Glycemic control for   <7.0% adults with diabetes     CBG: Recent Labs  Lab 09/23/22 1138 09/23/22 1522 09/23/22 1939 09/23/22 2319 09/24/22 0321  GLUCAP 123* 131* 125* 116* 120*     Critical care time:   CRITICAL CARE Performed by: Leslye Peer  Total critical care time: NA  Levy Pupa, MD, PhD 09/24/2022, 7:25 AM [facility] [phone] or if no answer before 7:00PM call [phone] For any issues after 7:00PM please call eLink [phone]

## 2022-09-25 ENCOUNTER — Encounter (HOSPITAL_COMMUNITY): Payer: Self-pay

## 2022-09-25 ENCOUNTER — Inpatient Hospital Stay (HOSPITAL_COMMUNITY): Payer: Medicare Other

## 2022-09-25 DIAGNOSIS — Z9911 Dependence on respirator [ventilator] status: Secondary | ICD-10-CM | POA: Diagnosis not present

## 2022-09-25 LAB — CBC
HCT: 28.8 % — ABNORMAL LOW (ref 39.0–52.0)
Hemoglobin: 8.4 g/dL — ABNORMAL LOW (ref 13.0–17.0)
MCH: 26 pg (ref 26.0–34.0)
MCHC: 29.2 g/dL — ABNORMAL LOW (ref 30.0–36.0)
MCV: 89.2 fL (ref 80.0–100.0)
Platelets: 301 10*3/uL (ref 150–400)
RBC: 3.23 MIL/uL — ABNORMAL LOW (ref 4.22–5.81)
RDW: 19.7 % — ABNORMAL HIGH (ref 11.5–15.5)
WBC: 15.3 10*3/uL — ABNORMAL HIGH (ref 4.0–10.5)
nRBC: 0.1 % (ref 0.0–0.2)

## 2022-09-25 LAB — MAGNESIUM
Magnesium: 1.9 mg/dL (ref 1.7–2.4)
Magnesium: 2 mg/dL (ref 1.7–2.4)

## 2022-09-25 LAB — PHOSPHORUS
Phosphorus: 2.1 mg/dL — ABNORMAL LOW (ref 2.5–4.6)
Phosphorus: 3.2 mg/dL (ref 2.5–4.6)

## 2022-09-25 LAB — GLUCOSE, CAPILLARY
Glucose-Capillary: 122 mg/dL — ABNORMAL HIGH (ref 70–99)
Glucose-Capillary: 113 mg/dL — ABNORMAL HIGH (ref 70–99)
Glucose-Capillary: 150 mg/dL — ABNORMAL HIGH (ref 70–99)
Glucose-Capillary: 155 mg/dL — ABNORMAL HIGH (ref 70–99)
Glucose-Capillary: 147 mg/dL — ABNORMAL HIGH (ref 70–99)
Glucose-Capillary: 130 mg/dL — ABNORMAL HIGH (ref 70–99)

## 2022-09-25 LAB — HEMOGLOBIN A1C
Hgb A1c MFr Bld: 5.8 % — ABNORMAL HIGH (ref 4.8–5.6)
Mean Plasma Glucose: 119.76 mg/dL

## 2022-09-25 MED ORDER — ZINC OXIDE 40 % EX OINT: TOPICAL_OINTMENT | Freq: Two times a day (BID) | CUTANEOUS | 0 refills | Status: DC

## 2022-09-25 MED ORDER — MEDIHONEY WOUND/BURN DRESSING EX PSTE: 1.0000 | PASTE | Freq: Every day | CUTANEOUS | 2 refills | Status: DC

## 2022-09-25 MED ORDER — INSULIN ASPART 100 UNIT/ML IJ SOLN
0.0000 [IU] | INTRAMUSCULAR | Status: DC
  Administered 2022-09-25 (×3): 2 [IU] via SUBCUTANEOUS
  Administered 2022-09-25: 3 [IU] via SUBCUTANEOUS
  Administered 2022-09-25: 2 [IU] via SUBCUTANEOUS
  Administered 2022-09-26 (×3): 3 [IU] via SUBCUTANEOUS

## 2022-09-25 MED ORDER — AMIODARONE HCL 200 MG PO TABS
200.0000 mg | ORAL_TABLET | Freq: Every day | ORAL | Status: DC
  Administered 2022-09-25 – 2022-09-26 (×2): 200 mg
  Filled 2022-09-25 (×2): qty 1

## 2022-09-25 MED ORDER — ZINC OXIDE 40 % EX OINT
TOPICAL_OINTMENT | Freq: Two times a day (BID) | CUTANEOUS | Status: DC
  Administered 2022-09-25: 1 via TOPICAL
  Filled 2022-09-25: qty 57

## 2022-09-25 MED ORDER — MEDIHONEY WOUND/BURN DRESSING EX PSTE
1.0000 | PASTE | Freq: Every day | CUTANEOUS | Status: DC
  Administered 2022-09-25 – 2022-09-26 (×2): 1 via TOPICAL
  Filled 2022-09-25: qty 44

## 2022-09-25 MED ORDER — GABAPENTIN 250 MG/5ML PO SOLN
200.0000 mg | Freq: Two times a day (BID) | ORAL | Status: DC
  Administered 2022-09-25 – 2022-09-26 (×3): 200 mg
  Filled 2022-09-25 (×4): qty 4

## 2022-09-25 MED ORDER — LEVOTHYROXINE SODIUM 25 MCG PO TABS
25.0000 ug | ORAL_TABLET | Freq: Every day | ORAL | Status: DC
  Administered 2022-09-25 – 2022-09-26 (×2): 25 ug
  Filled 2022-09-25 (×2): qty 1

## 2022-09-25 NOTE — Discharge Summary (Addendum)
Physician Discharge Summary  Patient ID: FAHIM FAIRCLOUGH MRN: [id number] DOB/AGE: 09/21/1940 82 y.o.  Admit date: 09/21/2022 Discharge date: 09/26/2022  Admission Diagnoses:  Discharge Diagnoses:  Principal Problem:   Ventilator dependence [facility])   Discharged Condition: stable  Hospital Course:  Austin Davis is a 82 year old male who presented to the ED via EMS from [facility] 9/1 with recurrent PEG tube dislodgment.  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  General surgery consulted for assistance in managing PEG tube dislodgment.  Of note patient was just admitted at this facility 8/20 to 8/21 with similar presentation of PEG tube dislodgment.   On ED admission patient was seen hemodynamically stable with borderline low blood pressure.  Oxygen saturations are 100% on 40% FiO2 via ventilator through tracheostomy.  Lab work significant for CO2 41 hemoglobin 6.9, hematocrit 24.0, WBC 12.1.  PCCM was consulted for further management admission  09/21/2022 PEG replaced at bedside by Dr. Dossie Der 9/4 GJ tube placed by IR and TF reinitiated  Did have some leukocytosis and secretions through the hospitalization, chest x-ray stable, antibiotics were deferred.    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 Plan to feed via J, vent the G tube. Hopefully this will allow some healing and resolution of leakage through the G-site. Last  resort would be surgical repair if PEG site does not begin to close   Paroxysmal atrial fibrillation, not on anticoagulation outpatient CAD with chronic HFrEF Hypertension P: -Not on outpatient anticoagulation -Restart enteral amiodarone -Restart amlodipine as an outpatient at [facility]   Hypothyroidism P: -Restart levothyroxine per tube on 9/5   Acute on chronic blood loss anemia P: -Follow CBC intermittently -Hemoglobin goal 7.0   Severe protein calorie malnutrition P: -Titrating TF up to goal -Appreciate nutrition assistance   Consults: pulmonary/intensive care, general surgery, and interventional radiology   Discharge Exam: Blood pressure 101/65, pulse 97, temperature 97.9 F (36.6 C), temperature source Oral, resp. rate (!) 28, height 5\' 10"  (1.778 m), weight 54.6 kg, SpO2 97%.  General: Thin chronically ill-appearing man, ventilated, awake HEENT: Trach midline, site clean, oropharynx clear.  No significant secretions Neuro: Opens eyes to voice, attempts to communicate, nods to questions CV: Irregularly irregular, distant, no murmur PULM: Coarse bilaterally GI: Nondistended, positive bowel sounds Extremities: No edema Skin: No ras  Disposition:  Discharge disposition: 82-Long Term Care       Discharge Instructions     Diet general   Complete by: As directed    Increase tube feed rate to goal rate of 60 mL/h   Discharge patient   Complete by: As directed    Discharge disposition: 82-Long Term Care   Discharge patient date: 09/26/2022   Discharge wound care:   Complete by: As directed    Wound care  Every shift      Comments: Clean around G tube with soap and water, crust as follows:  Sprinkle stoma powder [place] #6) over any irritated skin, brush away excess powder. Tap lightly over the powder with Cavilon No-Sting barrier wipe (skin barrier wipe-white and  blue package) or gloved finger damp with water. Allow to dry. Apply another layer of powder  following the same steps up to three layers. Place Drawtex [place] [phone]) around G tube to absorb excess drainage.   Wound care  Daily      Comments: Cleanse Coccyx wound with NS, apply Medihoney to wound bed daily, cover with dry gauze and secure with silicone foam or ABD pad whichever is preferred.  May lift silicone foam to replace Medihoney daily. Change foam dressings q3 days and prn soiling.   Increase activity slowly   Complete by: As directed       Allergies as of 09/26/2022       Reactions   Prednisone Other (See Comments)   Documented on MAR Unknown reaction   Cortisone Other (See Comments)   Documented on MAR Unknown reaction        Medication List     TAKE these medications    acetaminophen 325 MG tablet Commonly known as: TYLENOL Place 650 mg into feeding tube every 8 (eight) hours as needed for fever, headache or moderate pain.   amiodarone 200 MG tablet Commonly known as: PACERONE Place 1 tablet (200 mg total) into feeding tube daily.   amLODipine 5 MG tablet Commonly known as: NORVASC Place 5 mg into feeding tube daily.   budesonide 0.5 MG/2ML nebulizer solution Commonly known as: PULMICORT Take 0.5 mg by nebulization every 12 (twelve) hours.   feeding supplement (OSMOLITE 1.5 CAL) Liqd Place 1,000 mLs into feeding tube continuous.   Fish Oil 1000 MG Caps Place 1,000 mg into feeding tube daily.   folic acid 1 MG tablet Commonly known as: FOLVITE Take 1 tablet (1 mg total) by mouth daily. What changed: how to take this   gabapentin 100 MG capsule Commonly known as: NEURONTIN 200 mg See admin instructions. 200 mg via tube every 12 hours.   labetalol 5 MG/ML injection Commonly known as: NORMODYNE Inject 5 mg into the vein every 2 (two) hours as needed (I10 - Essential (primary) hypertension (no indication provided on MAR)).   lansoprazole 30 MG capsule Commonly known as: PREVACID Place 30 mg into feeding tube daily at 12 noon.    leptospermum manuka honey Pste paste Apply 1 Application topically daily.   levalbuterol 1.25 MG/3ML nebulizer solution Commonly known as: XOPENEX Take 1.25 mg by nebulization 4 (four) times daily.   levothyroxine 25 MCG tablet Commonly known as: SYNTHROID Place 25 mcg into feeding tube daily before breakfast.   liver oil-zinc oxide 40 % ointment Commonly known as: DESITIN Apply topically 2 (two) times daily.   LORazepam 1 MG tablet Commonly known as: ATIVAN Take 1 mg by mouth every 8 (eight) hours as needed for anxiety.   multivitamin tablet Place 1 tablet into feeding tube daily.   polyethylene glycol 17 g packet Commonly known as: MIRALAX / GLYCOLAX Place 17 g into feeding tube daily as needed (constipation).   sertraline 100 MG tablet Commonly known as: ZOLOFT Place 100 mg into feeding tube daily.   sodium chloride 0.65 % Soln nasal spray Commonly known as: OCEAN Place 1 spray into both nostrils every 8 (eight) hours.               Discharge Care Instructions  (From admission, onward)           Start     Ordered   09/25/22 0000  Discharge wound care:       Comments: Wound care  Every shift  Comments: Clean around G tube with soap and water, crust as follows:  Sprinkle stoma powder [place] #6) over any irritated skin, brush away excess powder. Tap lightly over the powder with Cavilon No-Sting barrier wipe (skin barrier wipe-white and blue package) or gloved finger damp with water. Allow to dry. Apply another layer of powder following the same steps up to three layers. Place Drawtex [place] [phone]) around G tube to absorb excess drainage.   Wound care  Daily      Comments: Cleanse Coccyx wound with NS, apply Medihoney to wound bed daily, cover with dry gauze and secure with silicone foam or ABD pad whichever is preferred.  May lift silicone foam to replace Medihoney daily. Change foam dressings q3 days and prn soiling.   09/25/22 1124              Signed: Leslye Peer 09/26/2022, 2:31 PM

## 2022-09-25 NOTE — Progress Notes (Signed)
eLink Physician-Brief Progress Note Patient Name: Austin Davis DOB: 04-20-1940 MRN: [id number]   Date of Service  09/25/2022  HPI/Events of Note  Tube feeds initiated after PEG tube placement.  On continuous D5 LR as well.  eICU Interventions  Discontinue D5 LR, add every 4 hours sliding scale     Intervention Category Minor Interventions: Routine modifications to care plan (e.g. PRN medications for pain, fever)  Monseratt Ledin 09/25/2022, 2:42 AM

## 2022-09-25 NOTE — TOC Progression Note (Signed)
Transition of Care [facility]) - Progression Note    Patient Details  Name: Austin Davis MRN: [id number] Date of Birth: [birth year]  [facility]) CM/SW Contact  Epifanio Lesches, RN Phone Number: 09/25/2022, 11:25 AM  Clinical Narrative:    Per MD pt d/c ready to return to [facility]. NCM made [employer] (Legal Guardian [phone]) aware.  TOC team to f/u and assist with needs...  Expected Discharge Plan: Skilled Nursing Facility Barriers to Discharge: No Barriers Identified  Expected Discharge Plan and Services In-house Referral: Clinical Social Work   Post Acute Care Choice: Skilled Nursing Facility Living arrangements for the past 2 months: Skilled Nursing Facility ([facility]) Expected Discharge Date: 09/25/22                                     Social Determinants of Health (SDOH) Interventions SDOH Screenings   Alcohol Screen: Low Risk  (04/12/2021)  Tobacco Use: Low Risk  (09/21/2022)    Readmission Risk Interventions    03/18/2022   11:45 AM 03/03/2022   12:04 PM 02/07/2022   11:58 AM  Readmission Risk Prevention Plan  Transportation Screening Complete Complete Complete  Medication Review Oceanographer) Complete Complete   PCP or Specialist appointment within 3-5 days of discharge Complete Complete   HRI or Home Care Consult Complete Complete   SW Recovery Care/Counseling Consult Complete Complete Complete  Palliative Care Screening Not Applicable Not Applicable   Skilled Nursing Facility Complete Complete Complete

## 2022-09-25 NOTE — TOC Initial Note (Signed)
Transition of Care [facility]) - Initial/Assessment Note    Patient Details  Name: Austin Davis MRN: [id number] Date of Birth: 03-04-1940  Transition of Care [facility]) CM/SW Contact:    Lorri Frederick, LCSW Phone Number: 09/25/2022, 10:53 AM  Clinical Narrative:     CSW attempted to speak with pt on 9/4, 1500.  Pt awake, nodded head in response to questions but unable to participate more than that.    CSW informed by Angie/[facility] that DSS legal guardian is now Chartered loss adjuster.  Facesheet updated.  Pt admitted from [facility] subacute unit.    CSW spoke with Kinessha/DSS.  She was already aware that pt is here, does plan for pt to return, does have some medical questions and will reach out to RN.              Expected Discharge Plan: Skilled Nursing Facility Barriers to Discharge: Continued Medical Work up   Patient Goals and CMS Choice     Choice offered to / list presented to : [facility] POA / Guardian (Kineesha/DSS)      Expected Discharge Plan and Services In-house Referral: Clinical Social Work   Post Acute Care Choice: Skilled Nursing Facility Living arrangements for the past 2 months: Skilled Nursing Facility ([facility])                                      Prior Living Arrangements/Services Living arrangements for the past 2 months: Skilled Nursing Facility ([facility]) Lives with:: Facility Resident Patient language and need for interpreter reviewed:: Yes        Need for Family Participation in Patient Care: Yes (Comment) Care giver support system in place?: Yes (comment) Current home services: Other (comment) (na) Criminal Activity/Legal Involvement Pertinent to Current Situation/Hospitalization: No - Comment as needed  Activities of Daily Living      Permission Sought/Granted                  Emotional Assessment Appearance:: Appears stated age Attitude/Demeanor/Rapport: Unable to Assess Affect (typically observed): Unable to Assess         Admission diagnosis:  Ventilator dependence (HCC) [Z99.11] Feeding tube dysfunction, initial encounter [T85.598A] Abdominal tenderness, rebound tenderness presence not specified, unspecified location [R10.819] Patient Active Problem List   Diagnosis Date Noted   Ventilator dependence (HCC) 09/21/2022   Hypokalemia 09/09/2022   Pseudomonal pneumonia (HCC) 07/04/2022   Hypernatremia 07/04/2022   Chronic respiratory failure with hypercapnia (HCC) 05/19/2022   Gastrojejunostomy tube dislodgement 03/17/2022   Urinary tract infection without hematuria 03/17/2022   Multiple drug resistant organism (MDRO) culture positive 02/04/2022   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   HCAP (healthcare-associated pneumonia) 02/01/2022   Hyperkalemia 01/04/2022   Encephalopathy acute 01/04/2022   Seizure (HCC) 01/04/2022   Seizures (HCC) 06/26/2021   Tremor    Sepsis with metabolic encephalopathy (HCC) 82/95/6213   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   Impaired quality of life 05/30/2021   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 05/15/2021   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HCC) 05/08/2021   Pneumonia 05/08/2021   Urinary tract infection associated with indwelling urethral catheter (HCC) 05/08/2021   Encephalopathy 05/08/2021   Pressure injury of skin 04/30/2021   Seizure disorder (HCC) 04/29/2021   Pneumothorax 04/11/2021   Ventilator dependent (HCC)    Parkinsonism 03/25/2021   Obstructive sleep apnea 10/24/2020   Chronic obstructive  pulmonary disease (HCC) 10/24/2020   Chronic heart failure with preserved ejection fraction (HCC) 10/24/2020   Unintentional weight loss 09/20/2020   SVT (supraventricular tachycardia) 09/20/2020   Protein-calorie malnutrition, severe 09/17/2020   Failure to thrive in adult 09/16/2020   Stage 1 skin ulcer of sacral region (HCC) 09/16/2020   Chronic hypotension 06/30/2020   History of GI bleed  02/17/2017   Rectal bleeding 02/16/2017   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 [place] [place] [address] Phone: [phone] Fax: [phone]  [facility] [address] [place] [address] Phone: [phone] Fax: [phone]     Social Determinants of Health (SDOH) Social History: SDOH Screenings   Alcohol Screen: Low Risk  (04/12/2021)  Tobacco Use: Low Risk  (09/21/2022)   SDOH Interventions:     Readmission Risk Interventions    03/18/2022   11:45 AM 03/03/2022   12:04 PM 02/07/2022   11:58 AM  Readmission Risk Prevention Plan  Transportation Screening Complete Complete Complete  Medication Review Oceanographer) Complete Complete   PCP or Specialist appointment within 3-5 days of discharge Complete Complete   HRI or Home Care Consult Complete Complete   SW Recovery Care/Counseling Consult Complete Complete Complete  Palliative Care Screening Not Applicable Not Applicable   Skilled Nursing Facility Complete Complete Complete

## 2022-09-25 NOTE — Consult Note (Addendum)
WOC Nurse Consult Note: this patient is familiar to WOC team from previous admissions; this consult performed remotely after review of EMR including photo documentation  Reason for Consult: unstageable Coccyx Pressure Injury  Wound type: Unstageable Coccyx Pressure Injury  Pressure Injury POA: Yes Measurement: see nursing flowsheet  Wound bed: 60% yellow brown slough 40% pink moist  Drainage (amount, consistency, odor) serosanguinous per flowsheet  Periwound: erythema, peeling epithelium  Dressing procedure/placement/frequency: Cleanse Coccyx wound with NS, apply Medihoney to wound bed daily, cover with dry gauze and secure with silicone foam or ABD pad whichever is preferred.  May lift silicone foam to replace Medihoney daily.  Change foam dressings q3 days and prn soiling.   Will order zinc oxide to be applied 2 times a day and prn soiling to sacrum/buttocks.  .    Had seen G tube on 09/23/2022 at surgery request.  Moisture Associated Skin Damage noted at that time.  Did bring stoma powder to room for nurse to crust area with stoma powder and no sting barrier wipes.  Also ordered [employer] [place] [phone] to assist with absorbing drainage from around G tube site.    Patient should remain on low air loss mattress throughout hospitalization.    WOC team will not follow at this time. Re-consult if further needs arise.   Thank you,    Priscella Mann MSN, RN-BC, [employer] [phone]

## 2022-09-25 NOTE — Progress Notes (Signed)
IR with successful gastrostomy to gastrojejunostomy conversion. G to gravity drainage and J port for tube feedings for now to hopefully decrease drainage around the tube and allow wound around the tube to shrink/heal. No role for surgery at this time.  We will sign off, please call with questions or concerns.  Franne Forts, PA-C [facility] Surgery 09/25/2022, 9:16 AM Please see Amion for pager number during day hours 7:00am-4:30pm

## 2022-09-25 NOTE — Progress Notes (Signed)
NAME:  Austin Davis, MRN:  [id number], DOB:  08/23/1940, LOS: 4 ADMISSION DATE:  09/21/2022, CONSULTATION DATE:  09/21/2022 REFERRING MD:  Dr. Rush Landmark - EDP, CHIEF COMPLAINT: Malfunctioning PEG tube  History of Present Illness:  Austin Davis is a 82 year old male who presented to the ED via EMS from [facility] 9/1 with recurrent PEG tube dislodgment.  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  General surgery consulted for assistance in managing PEG tube dislodgment.  Of note patient was just admitted at this facility 8/20 to 8/21 with similar presentation of PEG tube dislodgment.  On ED admission patient was seen hemodynamically stable with borderline low blood pressure.  Oxygen saturations are 100% on 40% FiO2 via ventilator through tracheostomy.  Lab work significant for CO2 41 hemoglobin 6.9, hematocrit 24.0, WBC 12.1.  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 Dossie Der 9/4 GJ tube placed by IR and TF reinitiated  Interim History / Subjective:  JG placed in IR 9/4 anad TF restarted, D5 stopped Required lavage to clear secretions overnight 9/4-9/5  Objective   Blood pressure 99/62, pulse 97, temperature 98 F (36.7 C), temperature source Oral, resp. rate (!) 26, height 5\' 10"  (1.778 m), weight 54.6 kg, SpO2 99%.    Vent Mode: PCV FiO2 (%):  [40 %] 40 % Set Rate:  [24 bmp-26 bmp] 26 bmp PEEP:   [5 cmH20] 5 cmH20 Plateau Pressure:  [22 cmH20-28 cmH20] 26 cmH20   Intake/Output Summary (Last 24 hours) at 09/25/2022 0722 Last data filed at 09/25/2022 0200 Gross per 24 hour  Intake 718.62 ml  Output 400 ml  Net 318.62 ml   Filed Weights   09/23/22 0500 09/24/22 0500 09/25/22 0500  Weight: 54.2 kg 54.6 kg 54.6 kg    Examination: General: Thin chronically ill-appearing man, ventilated, awake HEENT: Trach midline, site clean, oropharynx clear.  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 Plan to feed via J, use suction on G tube. Hopefully this will allow some healing and resolution of leakage through the G-site. Last resort would be surgical repair  Leukocytosis, recurrent 9/4 P: -etiology unclear, no clear evidence active infection but he has had increased secretions last 24 hours -Recheck CBC this morning 9/5 -Chest x-ray now -If WBC remains elevated then we will check cultures  Paroxysmal atrial fibrillation, not on anticoagulation outpatient CAD with chronic HFrEF Hypertension P: -Not on outpatient anticoagulation -Restart enteral amiodarone -Restart amlodipine when his blood pressure can tolerate  Hypokalemia P: -Following BMP  Hypothyroidism P: -Restart levothyroxine per tube on 9/5  Acute on chronic blood loss  anemia P: -Follow CBC -Hemoglobin goal 7.0  Severe protein calorie malnutrition P: -Titrate TF up to goal -Appreciate nutrition assistance  Disposition: P: -Patient may be able to go back to LTAC 9/5 or 9/6 depending on WBC, chest x-ray, ability to tolerate TF    Best Practice (right click and "Reselect all SmartList Selections" daily)   Diet/type: NPO DVT prophylaxis: prophylactic heparin  GI prophylaxis: PPI Lines: N/A Foley:  N/A Code Status:  full code Last date of multidisciplinary goals of care discussion: Pending   Labs   CBC: Recent Labs  Lab 09/21/22 1454 09/21/22 1612 09/21/22 1649 09/21/22 2233 09/22/22 0228 09/23/22 0317 09/24/22 0322  WBC 12.1*  --  11.6*  --  8.6 12.8* 20.9*  NEUTROABS 10.3*  --   --   --   --   --  20.0*  HGB 6.9*   < > 7.4* 8.6* 8.6* 9.4* 9.4*  HCT 24.0*   < > 25.7* 28.6* 28.4* 32.2* 32.0*  MCV 88.9  --  87.1  --  86.9 86.8 89.9  PLT 361  --  370  --  360 505* 368   < > = values in this interval not displayed.    Basic Metabolic Panel: Recent Labs  Lab 09/21/22 1454 09/21/22 1612 09/21/22 1649 09/22/22 0228 09/22/22 1910 09/23/22 0317 09/23/22 2300 09/24/22 1737  NA 138 139  --  140 138 138 143  --   K 3.8 4.1  --  2.8* 3.6 3.6 4.0  --   CL 86*  --   --  92* 97* 98 98  --   CO2 41*  --   --  38* 30 32 33*  --   GLUCOSE 100*  --   --  76 106* 159* 132*  --   BUN 27*  --   --  22 18 20 10   --   CREATININE <0.30*  --  0.32* 0.32* <0.30* 0.33* <0.30*  --   CALCIUM 8.0*  --   --  8.3* 8.4* 8.9 8.9  --   MG  --   --   --  2.1  --  2.1  --  1.9  PHOS  --   --   --  4.1  --  4.9*  --  3.2   GFR: CrCl cannot be calculated (This lab value cannot be used to calculate CrCl because it is not a number: <0.30). Recent Labs  Lab 09/21/22 1454 09/21/22 1649 09/22/22 0228 09/23/22 0317 09/24/22 0322  WBC 12.1* 11.6* 8.6 12.8* 20.9*  LATICACIDVEN 0.6 1.0  --   --   --     Liver Function Tests: Recent Labs  Lab  09/21/22 1454  AST 24  ALT 25  ALKPHOS 81  BILITOT 0.4  PROT 6.6  ALBUMIN 1.7*   Recent Labs  Lab 09/21/22 1454  LIPASE 23   No results for input(s): "AMMONIA" in the last 168 hours.  ABG    Component Value Date/Time   PHART 7.388 09/21/2022 1612   PCO2ART 89.3 (HH) 09/21/2022 1612   PO2ART 69 (L) 09/21/2022 1612   HCO3 53.8 (H) 09/21/2022 1612   TCO2 >50 (H) 09/21/2022 1612   ACIDBASEDEF 2.0 05/30/2021 0052   O2SAT 91 09/21/2022 1612     Coagulation Profile: No results for input(s): "INR", "PROTIME" in the last 168 hours.  Cardiac Enzymes: No  results for input(s): "CKTOTAL", "CKMB", "CKMBINDEX", "TROPONINI" in the last 168 hours.  HbA1C: Hgb A1c MFr Bld  Date/Time Value Ref Range Status  01/04/2022 11:22 PM 5.3 4.8 - 5.6 % Final    Comment:    (NOTE)         Prediabetes: 5.7 - 6.4         Diabetes: >6.4         Glycemic control for adults with diabetes: <7.0   04/11/2021 09:12 PM 5.5 4.8 - 5.6 % Final    Comment:    (NOTE) Pre diabetes:          5.7%-6.4%  Diabetes:              >6.4%  Glycemic control for   <7.0% adults with diabetes     CBG: Recent Labs  Lab 09/24/22 1140 09/24/22 1644 09/24/22 1942 09/24/22 2329 09/25/22 0320  GLUCAP 100* 80 76 115* 122*     Critical care time:   CRITICAL CARE Performed by: Leslye Peer  Total critical care time: NA  Levy Pupa, MD, PhD 09/25/2022, 7:22 AM [facility] [phone] or if no answer before 7:00PM call [phone] For any issues after 7:00PM please call eLink [phone]

## 2022-09-25 NOTE — TOC Transition Note (Addendum)
Transition of Care [facility]) - CM/SW Discharge Note   Patient Details  Name: Austin Davis MRN: [id number] Date of Birth: [birth year]  [facility]) CM/SW Contact:  Lorri Frederick, LCSW Phone Number: 09/25/2022, 1:06 PM   Clinical Narrative:  Pt discharging to [facility] subacute unit room 310.  [facility] is accepting MD.  RN call report to [phone].    1345: TC from [facility].  Dr Claris Gower has read the DC summary and is not willing for pt to return to [facility] until he has had surgical repair to his feeding tube.  CSW went to 46M, spoke to RN, unable to locate MD at this time.  Message sent to MD. Transport placed on hold with [facility].   1430: [facility] MD asking for phone number to reach out to Dr Delton Coombes, number provided by Dr Delton Coombes, passed on to [facility].    1520: Per [facility], Dr Aris Georgia has phone number, not sure when he will call.   Final next level of care: Skilled Nursing Facility Barriers to Discharge: Barriers Resolved   Patient Goals and CMS Choice   Choice offered to / list presented to : [facility] POA / Guardian ([place]/DSS)  Discharge Placement                Patient chooses bed at:  ([facility] SNF) Patient to be transferred to facility by: [facility] Name of family member notified: Cloyde Reams guardian, daughter [name] Patient and family notified of of transfer: 09/25/22  Discharge Plan and Services Additional resources added to the After Visit Summary for   In-house Referral: Clinical Social Work   Post Acute Care Choice: Skilled Nursing Facility                               Social Determinants of Health (SDOH) Interventions SDOH Screenings   Alcohol Screen: Low Risk  (04/12/2021)  Tobacco Use: Low Risk  (09/21/2022)     Readmission Risk Interventions    03/18/2022   11:45 AM 03/03/2022   12:04 PM 02/07/2022   11:58 AM  Readmission Risk Prevention Plan  Transportation Screening Complete Complete Complete  Medication Review Special educational needs teacher) Complete Complete   PCP or Specialist appointment within 3-5 days of discharge Complete Complete   HRI or Home Care Consult Complete Complete   SW Recovery Care/Counseling Consult Complete Complete Complete  Palliative Care Screening Not Applicable Not Applicable   Skilled Nursing Facility Complete Complete Complete

## 2022-09-26 DIAGNOSIS — R109 Unspecified abdominal pain: Secondary | ICD-10-CM | POA: Diagnosis not present

## 2022-09-26 DIAGNOSIS — R000 Tachycardia, unspecified: Secondary | ICD-10-CM | POA: Diagnosis not present

## 2022-09-26 DIAGNOSIS — J398 Other specified diseases of upper respiratory tract: Secondary | ICD-10-CM | POA: Diagnosis not present

## 2022-09-26 DIAGNOSIS — R4189 Other symptoms and signs involving cognitive functions and awareness: Secondary | ICD-10-CM | POA: Diagnosis not present

## 2022-09-26 DIAGNOSIS — Z930 Tracheostomy status: Secondary | ICD-10-CM | POA: Diagnosis not present

## 2022-09-26 DIAGNOSIS — Z430 Encounter for attention to tracheostomy: Secondary | ICD-10-CM | POA: Diagnosis not present

## 2022-09-26 DIAGNOSIS — J9621 Acute and chronic respiratory failure with hypoxia: Secondary | ICD-10-CM | POA: Diagnosis present

## 2022-09-26 DIAGNOSIS — I4891 Unspecified atrial fibrillation: Secondary | ICD-10-CM | POA: Diagnosis not present

## 2022-09-26 DIAGNOSIS — I110 Hypertensive heart disease with heart failure: Secondary | ICD-10-CM | POA: Diagnosis not present

## 2022-09-26 DIAGNOSIS — E872 Acidosis, unspecified: Secondary | ICD-10-CM | POA: Diagnosis not present

## 2022-09-26 DIAGNOSIS — G40909 Epilepsy, unspecified, not intractable, without status epilepticus: Secondary | ICD-10-CM | POA: Diagnosis not present

## 2022-09-26 DIAGNOSIS — G9349 Other encephalopathy: Secondary | ICD-10-CM | POA: Diagnosis not present

## 2022-09-26 DIAGNOSIS — I480 Paroxysmal atrial fibrillation: Secondary | ICD-10-CM | POA: Diagnosis present

## 2022-09-26 DIAGNOSIS — I5022 Chronic systolic (congestive) heart failure: Secondary | ICD-10-CM | POA: Diagnosis not present

## 2022-09-26 DIAGNOSIS — K9423 Gastrostomy malfunction: Secondary | ICD-10-CM | POA: Diagnosis not present

## 2022-09-26 DIAGNOSIS — J9612 Chronic respiratory failure with hypercapnia: Secondary | ICD-10-CM | POA: Diagnosis not present

## 2022-09-26 DIAGNOSIS — A419 Sepsis, unspecified organism: Secondary | ICD-10-CM | POA: Diagnosis not present

## 2022-09-26 DIAGNOSIS — Z452 Encounter for adjustment and management of vascular access device: Secondary | ICD-10-CM | POA: Diagnosis not present

## 2022-09-26 DIAGNOSIS — I959 Hypotension, unspecified: Secondary | ICD-10-CM | POA: Diagnosis not present

## 2022-09-26 DIAGNOSIS — E873 Alkalosis: Secondary | ICD-10-CM | POA: Diagnosis not present

## 2022-09-26 DIAGNOSIS — I2510 Atherosclerotic heart disease of native coronary artery without angina pectoris: Secondary | ICD-10-CM | POA: Diagnosis not present

## 2022-09-26 DIAGNOSIS — J189 Pneumonia, unspecified organism: Secondary | ICD-10-CM | POA: Diagnosis not present

## 2022-09-26 DIAGNOSIS — R5381 Other malaise: Secondary | ICD-10-CM | POA: Diagnosis not present

## 2022-09-26 DIAGNOSIS — R2689 Other abnormalities of gait and mobility: Secondary | ICD-10-CM | POA: Diagnosis not present

## 2022-09-26 DIAGNOSIS — R188 Other ascites: Secondary | ICD-10-CM | POA: Diagnosis not present

## 2022-09-26 DIAGNOSIS — R1312 Dysphagia, oropharyngeal phase: Secondary | ICD-10-CM | POA: Diagnosis not present

## 2022-09-26 DIAGNOSIS — J860 Pyothorax with fistula: Secondary | ICD-10-CM | POA: Diagnosis not present

## 2022-09-26 DIAGNOSIS — Z9911 Dependence on respirator [ventilator] status: Secondary | ICD-10-CM | POA: Diagnosis not present

## 2022-09-26 DIAGNOSIS — E875 Hyperkalemia: Secondary | ICD-10-CM | POA: Diagnosis not present

## 2022-09-26 DIAGNOSIS — Z9981 Dependence on supplemental oxygen: Secondary | ICD-10-CM | POA: Diagnosis not present

## 2022-09-26 DIAGNOSIS — J90 Pleural effusion, not elsewhere classified: Secondary | ICD-10-CM | POA: Diagnosis not present

## 2022-09-26 DIAGNOSIS — E43 Unspecified severe protein-calorie malnutrition: Secondary | ICD-10-CM | POA: Diagnosis not present

## 2022-09-26 DIAGNOSIS — D62 Acute posthemorrhagic anemia: Secondary | ICD-10-CM | POA: Diagnosis not present

## 2022-09-26 DIAGNOSIS — K219 Gastro-esophageal reflux disease without esophagitis: Secondary | ICD-10-CM | POA: Diagnosis not present

## 2022-09-26 DIAGNOSIS — E039 Hypothyroidism, unspecified: Secondary | ICD-10-CM | POA: Diagnosis not present

## 2022-09-26 DIAGNOSIS — J9620 Acute and chronic respiratory failure, unspecified whether with hypoxia or hypercapnia: Secondary | ICD-10-CM | POA: Diagnosis not present

## 2022-09-26 DIAGNOSIS — R1311 Dysphagia, oral phase: Secondary | ICD-10-CM | POA: Diagnosis not present

## 2022-09-26 DIAGNOSIS — K3184 Gastroparesis: Secondary | ICD-10-CM | POA: Diagnosis not present

## 2022-09-26 DIAGNOSIS — J9601 Acute respiratory failure with hypoxia: Secondary | ICD-10-CM | POA: Diagnosis not present

## 2022-09-26 DIAGNOSIS — R1310 Dysphagia, unspecified: Secondary | ICD-10-CM | POA: Diagnosis not present

## 2022-09-26 DIAGNOSIS — R0989 Other specified symptoms and signs involving the circulatory and respiratory systems: Secondary | ICD-10-CM | POA: Diagnosis not present

## 2022-09-26 DIAGNOSIS — I5020 Unspecified systolic (congestive) heart failure: Secondary | ICD-10-CM | POA: Diagnosis not present

## 2022-09-26 DIAGNOSIS — R0902 Hypoxemia: Secondary | ICD-10-CM | POA: Diagnosis not present

## 2022-09-26 DIAGNOSIS — R6521 Severe sepsis with septic shock: Secondary | ICD-10-CM | POA: Diagnosis not present

## 2022-09-26 LAB — PHOSPHORUS: Phosphorus: 1.8 mg/dL — ABNORMAL LOW (ref 2.5–4.6)

## 2022-09-26 LAB — MAGNESIUM: Magnesium: 2.1 mg/dL (ref 1.7–2.4)

## 2022-09-26 LAB — GLUCOSE, CAPILLARY
Glucose-Capillary: 169 mg/dL — ABNORMAL HIGH (ref 70–99)
Glucose-Capillary: 167 mg/dL — ABNORMAL HIGH (ref 70–99)
Glucose-Capillary: 171 mg/dL — ABNORMAL HIGH (ref 70–99)

## 2022-09-26 MED ORDER — SODIUM PHOSPHATES 45 MMOLE/15ML IV SOLN
30.0000 mmol | Freq: Once | INTRAVENOUS | Status: AC
  Administered 2022-09-26: 30 mmol via INTRAVENOUS
  Filled 2022-09-26: qty 10

## 2022-09-26 MED ORDER — SERTRALINE HCL 50 MG PO TABS
100.0000 mg | ORAL_TABLET | Freq: Every day | ORAL | Status: DC
  Administered 2022-09-26: 100 mg
  Filled 2022-09-26: qty 2

## 2022-09-26 NOTE — Progress Notes (Signed)
NAME:  Austin Davis, MRN:  [id number], DOB:  March 22, 1940, LOS: 5 ADMISSION DATE:  09/21/2022, CONSULTATION DATE:  09/21/2022 REFERRING MD:  Dr. Rush Landmark - EDP, CHIEF COMPLAINT: Malfunctioning PEG tube  History of Present Illness:  Austin Davis is a 82 year old male who presented to the ED via EMS from [facility] 9/1 with recurrent PEG tube dislodgment.  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  General surgery consulted for assistance in managing PEG tube dislodgment.  Of note patient was just admitted at this facility 8/20 to 8/21 with similar presentation of PEG tube dislodgment.  On ED admission patient was seen hemodynamically stable with borderline low blood pressure.  Oxygen saturations are 100% on 40% FiO2 via ventilator through tracheostomy.  Lab work significant for CO2 41 hemoglobin 6.9, hematocrit 24.0, WBC 12.1.  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 Dossie Der 9/4 GJ tube placed by IR and TF reinitiated  Interim History / Subjective:  Tolerating tube feeds at goal via the J-tube G-tube is vented Stable on his chronic vent settings He did not transfer back to [facility] on 9/5, apparently concern about failure to surgically close existing PEG site  Objective   Blood pressure 105/67, pulse 98, temperature (!) 96.7 F (35.9 C), temperature source Axillary, resp. rate (!) 26, height 5\' 10"   (1.778 m), weight 54.6 kg, SpO2 100%.    Vent Mode: PCV FiO2 (%):  [40 %] 40 % Set Rate:  [26 bmp] 26 bmp PEEP:  [5 cmH20] 5 cmH20 Plateau Pressure:  [23 cmH20-25 cmH20] 23 cmH20   Intake/Output Summary (Last 24 hours) at 09/26/2022 0806 Last data filed at 09/26/2022 0746 Gross per 24 hour  Intake 988.83 ml  Output 1350 ml  Net -361.17 ml   Filed Weights   09/23/22 0500 09/24/22 0500 09/25/22 0500  Weight: 54.2 kg 54.6 kg 54.6 kg    Examination: General: Thin, chronically ill, ventilated, awake HEENT: Trach in good position, site clean, oropharynx clear Neuro: Eyes open to voice, tracks, attempts to communicate but difficult to understand.  Moves upper extremities.  Some intermittent facial tremor CV: Irregularly irregular, distant, no murmur PULM: Coarse bilaterally GI: Nondistended with positive bowel sounds.  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  He has a GJ tube in place with tube feeds running and being tolerated.  The G-tube is vented.  Plan is to allow healing of the site and hopefully resolution of leakage in absence of any feeds being introduced into the stomach. -I will need to confer with [facility] Cheral Almas at [facility] regarding how the tube dislodgment was handled.  There was concern that he  should not leave the ICU until his site is surgically repaired.  Leukocytosis, recurrent 9/4, improved 9/5 P: -Overall stable without any clear evidence for infection -Continue to follow clinically -Intermittent WBC,  intermittent chest x-ray  Paroxysmal atrial fibrillation, not on anticoagulation outpatient CAD with chronic HFrEF Hypertension P: -Deferring outpatient anticoagulation -Continue enteral amiodarone -Holding off on restarting his outpatient amlodipine given normotension/relative hypotension  Hypokalemia Hypophosphatemia P: -Replete electrolytes as indicated -Intermittent follow BMP, recheck 9/7  Hypothyroidism P: -Levothyroxine per tube, restarted 9/5  Acute on chronic blood loss anemia P: -Intermittent follow CBC -Hemoglobin goal 7.0  Severe protein calorie malnutrition P: -Tolerating TF, at goal -Appreciate nutrition assistance  Disposition: P: -He is clinically stable to go back to [facility].  Discharge orders and note completed 9/5.  Barrier is apparent concern regarding how the PEG tube site was handled.  I reviewed with surgery this morning and all agree best plan was GJ tube placement, wound care and hopefully healing of the existing PEG site without putting him through the risk of surgical placement at a new site.   Best Practice (right click and "Reselect all SmartList Selections" daily)   Diet/type: NPO DVT prophylaxis: prophylactic heparin  GI prophylaxis: PPI Lines: N/A Foley:  N/A Code Status:  full code Last date of multidisciplinary goals of care discussion: Pending   Labs   CBC: Recent Labs  Lab 09/21/22 1454 09/21/22 1612 09/21/22 1649 09/21/22 2233 09/22/22 0228 09/23/22 0317 09/24/22 0322 09/25/22 0910  WBC 12.1*  --  11.6*  --  8.6 12.8* 20.9* 15.3*  NEUTROABS 10.3*  --   --   --   --   --  20.0*  --   HGB 6.9*   < > 7.4* 8.6* 8.6* 9.4* 9.4* 8.4*  HCT 24.0*   < > 25.7* 28.6* 28.4* 32.2* 32.0* 28.8*  MCV 88.9  --  87.1  --  86.9 86.8 89.9 89.2  PLT 361  --  370  --  360 505* 368 301   < > = values in this interval not displayed.    Basic Metabolic Panel: Recent Labs  Lab 09/21/22 1454 09/21/22 1612 09/21/22 1649 09/22/22 0228  09/22/22 0228 09/22/22 1910 09/23/22 0317 09/23/22 2300 09/24/22 1737 09/25/22 0910 09/25/22 1652 09/26/22 0529  NA 138 139  --  140  --  138 138 143  --   --   --   --   K 3.8 4.1  --  2.8*  --  3.6 3.6 4.0  --   --   --   --   CL 86*  --   --  92*  --  97* 98 98  --   --   --   --   CO2 41*  --   --  38*  --  30 32 33*  --   --   --   --   GLUCOSE 100*  --   --  76  --  106* 159* 132*  --   --   --   --   BUN 27*  --   --  22  --  18 20 10   --   --   --   --   CREATININE <0.30*  --  0.32* 0.32*  --  <0.30* 0.33* <0.30*  --   --   --   --   CALCIUM 8.0*  --   --  8.3*  --  8.4* 8.9 8.9  --   --   --   --  MG  --   --   --  2.1   < >  --  2.1  --  1.9 1.9 2.0 2.1  PHOS  --   --   --  4.1   < >  --  4.9*  --  3.2 2.1* 3.2 1.8*   < > = values in this interval not displayed.   GFR: CrCl cannot be calculated (This lab value cannot be used to calculate CrCl because it is not a number: <0.30). Recent Labs  Lab 09/21/22 1454 09/21/22 1649 09/22/22 0228 09/23/22 0317 09/24/22 0322 09/25/22 0910  WBC 12.1* 11.6* 8.6 12.8* 20.9* 15.3*  LATICACIDVEN 0.6 1.0  --   --   --   --     Liver Function Tests: Recent Labs  Lab 09/21/22 1454  AST 24  ALT 25  ALKPHOS 81  BILITOT 0.4  PROT 6.6  ALBUMIN 1.7*   Recent Labs  Lab 09/21/22 1454  LIPASE 23   No results for input(s): "AMMONIA" in the last 168 hours.  ABG    Component Value Date/Time   PHART 7.388 09/21/2022 1612   PCO2ART 89.3 (HH) 09/21/2022 1612   PO2ART 69 (L) 09/21/2022 1612   HCO3 53.8 (H) 09/21/2022 1612   TCO2 >50 (H) 09/21/2022 1612   ACIDBASEDEF 2.0 05/30/2021 0052   O2SAT 91 09/21/2022 1612     Coagulation Profile: No results for input(s): "INR", "PROTIME" in the last 168 hours.  Cardiac Enzymes: No results for input(s): "CKTOTAL", "CKMB", "CKMBINDEX", "TROPONINI" in the last 168 hours.  HbA1C: Hgb A1c MFr Bld  Date/Time Value Ref Range Status  09/25/2022 09:10 AM 5.8 (H) 4.8 - 5.6 % Final     Comment:    (NOTE) Pre diabetes:          5.7%-6.4%  Diabetes:              >6.4%  Glycemic control for   <7.0% adults with diabetes   01/04/2022 11:22 PM 5.3 4.8 - 5.6 % Final    Comment:    (NOTE)         Prediabetes: 5.7 - 6.4         Diabetes: >6.4         Glycemic control for adults with diabetes: <7.0     CBG: Recent Labs  Lab 09/25/22 1517 09/25/22 1923 09/25/22 2313 09/26/22 0315 09/26/22 0742  GLUCAP 155* 147* 130* 169* 167*     Critical care time:   CRITICAL CARE Performed by: Leslye Peer  Total critical care time: NA  Levy Pupa, MD, PhD 09/26/2022, 8:06 AM [facility] [phone] or if no answer before 7:00PM call [phone] For any issues after 7:00PM please call eLink [phone]

## 2022-09-26 NOTE — TOC Progression Note (Addendum)
Spoke to [place] with [facility] who reports they are able to accept pt on their LTAC side since there is an issue with return to SNF. No LTAC bed available today but there is potential for bed over weekend. TOC will f/u with [facility] tomorrow.   Pt's legal guardian Reynolds Bowl [phone]) updated re barrier to dc and current plan, she verbalized understanding and is agreeable. MD updated.  Dellie Burns, MSW, LCSW [phone] (coverage)

## 2022-09-26 NOTE — TOC Transition Note (Signed)
Transition of Care [facility]) - CM/SW Discharge Note   Patient Details  Name: Austin Davis MRN: [id number] Date of Birth: 1940/06/13  [facility]) CM/SW Contact:  Austin Davis, [place] Phone Number: 09/26/2022, 2:58 PM   Clinical Narrative:  pt for dc to [facility] today. Spoke to [place] in admissions who confirmed they are prepared to admit pt to room 327 with accepting physician Dr. Lum Keas. RN  to call report to [facility] [phone].   Updated pt's [name] who reports agreeable to dc plan.   DC packet complete and transport arranged with [facility].   Austin Davis, MSW, LCSW [phone] (coverage)       Final next level of care: Long Term Acute Care (LTAC) Barriers to Discharge: No Barriers Identified   Patient Goals and CMS Choice CMS Medicare.gov Compare Post Acute Care list provided to:: [name] Choice offered to / list presented to : [facility] [name]  Discharge Placement                Patient chooses bed at:  [facility]) Patient to be transferred to facility by: [facility] Name of family member notified: Austin Davis Patient and family notified of of transfer: 09/26/22  Discharge Plan and Services Additional resources added to the After Visit Summary for   In-house Referral: Clinical Social Work   Post Acute Care Choice: Skilled Nursing Facility                               Social Determinants of Health (SDOH) Interventions SDOH Screenings   Alcohol Screen: Low Risk  (04/12/2021)  Tobacco Use: Low Risk  (09/21/2022)     Readmission Risk Interventions    03/18/2022   11:45 AM 03/03/2022   12:04 PM 02/07/2022   11:58 AM  Readmission Risk Prevention Plan  Transportation Screening Complete Complete Complete  Medication Review Oceanographer) Complete Complete   PCP or Specialist appointment within 3-5 days of discharge Complete Complete   HRI or Home Care Consult  Complete Complete   SW Recovery Care/Counseling Consult Complete Complete Complete  Palliative Care Screening Not Applicable Not Applicable   Skilled Nursing Facility Complete Complete Complete

## 2022-09-26 NOTE — Progress Notes (Signed)
Leonia Reeves to be D/C'd to [facility]  per MD order.  Discussed with the patient and all questions fully answered.  VSS, Skin clean, dry and intact without evidence of skin break down, no evidence of skin tears noted. IV catheter discontinued intact. Site without signs and symptoms of complications. Dressing and pressure applied.  An After Visit Summary was printed and given to the patient. Patient received prescription.  D/c education completed with patient/family including follow up instructions, medication list, d/c activities limitations if indicated, with other d/c instructions as indicated by MD - patient able to verbalize understanding, all questions fully answered.   Patient instructed to return to ED, call 911, or call MD for any changes in condition.   Patient escorted by [facility]  to [facility].   Nurse from [facility] was updated and is aware of the patient transport.    Conley Canal [facility] 09/26/2022 4:08 PM

## 2022-09-27 DIAGNOSIS — Z930 Tracheostomy status: Secondary | ICD-10-CM

## 2022-09-27 DIAGNOSIS — I480 Paroxysmal atrial fibrillation: Secondary | ICD-10-CM

## 2022-09-27 DIAGNOSIS — I5022 Chronic systolic (congestive) heart failure: Secondary | ICD-10-CM

## 2022-09-27 DIAGNOSIS — J9621 Acute and chronic respiratory failure with hypoxia: Secondary | ICD-10-CM

## 2022-09-28 DIAGNOSIS — I480 Paroxysmal atrial fibrillation: Secondary | ICD-10-CM

## 2022-09-28 DIAGNOSIS — Z930 Tracheostomy status: Secondary | ICD-10-CM

## 2022-09-28 DIAGNOSIS — I5022 Chronic systolic (congestive) heart failure: Secondary | ICD-10-CM

## 2022-09-28 DIAGNOSIS — R000 Tachycardia, unspecified: Secondary | ICD-10-CM

## 2022-09-28 DIAGNOSIS — J9621 Acute and chronic respiratory failure with hypoxia: Secondary | ICD-10-CM

## 2022-09-29 DIAGNOSIS — J189 Pneumonia, unspecified organism: Secondary | ICD-10-CM | POA: Diagnosis not present

## 2022-09-29 DIAGNOSIS — A419 Sepsis, unspecified organism: Secondary | ICD-10-CM | POA: Diagnosis not present

## 2022-09-29 DIAGNOSIS — Z9981 Dependence on supplemental oxygen: Secondary | ICD-10-CM | POA: Diagnosis not present

## 2022-09-29 DIAGNOSIS — J9621 Acute and chronic respiratory failure with hypoxia: Secondary | ICD-10-CM | POA: Diagnosis not present

## 2022-09-29 DIAGNOSIS — J9601 Acute respiratory failure with hypoxia: Secondary | ICD-10-CM | POA: Diagnosis not present

## 2022-09-29 DIAGNOSIS — G40909 Epilepsy, unspecified, not intractable, without status epilepticus: Secondary | ICD-10-CM | POA: Diagnosis not present

## 2022-09-30 DIAGNOSIS — J189 Pneumonia, unspecified organism: Secondary | ICD-10-CM | POA: Diagnosis not present

## 2022-09-30 DIAGNOSIS — G40909 Epilepsy, unspecified, not intractable, without status epilepticus: Secondary | ICD-10-CM | POA: Diagnosis not present

## 2022-09-30 DIAGNOSIS — J9621 Acute and chronic respiratory failure with hypoxia: Secondary | ICD-10-CM | POA: Diagnosis not present

## 2022-09-30 DIAGNOSIS — J9601 Acute respiratory failure with hypoxia: Secondary | ICD-10-CM | POA: Diagnosis not present

## 2022-09-30 DIAGNOSIS — A419 Sepsis, unspecified organism: Secondary | ICD-10-CM | POA: Diagnosis not present

## 2022-09-30 DIAGNOSIS — Z9981 Dependence on supplemental oxygen: Secondary | ICD-10-CM | POA: Diagnosis not present

## 2022-10-01 DIAGNOSIS — Z9981 Dependence on supplemental oxygen: Secondary | ICD-10-CM | POA: Diagnosis not present

## 2022-10-01 DIAGNOSIS — J9601 Acute respiratory failure with hypoxia: Secondary | ICD-10-CM | POA: Diagnosis not present

## 2022-10-01 DIAGNOSIS — J189 Pneumonia, unspecified organism: Secondary | ICD-10-CM | POA: Diagnosis not present

## 2022-10-01 DIAGNOSIS — A419 Sepsis, unspecified organism: Secondary | ICD-10-CM | POA: Diagnosis not present

## 2022-10-01 DIAGNOSIS — G40909 Epilepsy, unspecified, not intractable, without status epilepticus: Secondary | ICD-10-CM | POA: Diagnosis not present

## 2022-10-01 DIAGNOSIS — J9621 Acute and chronic respiratory failure with hypoxia: Secondary | ICD-10-CM | POA: Diagnosis not present

## 2022-10-02 DIAGNOSIS — R4189 Other symptoms and signs involving cognitive functions and awareness: Secondary | ICD-10-CM | POA: Diagnosis not present

## 2022-10-02 DIAGNOSIS — J9621 Acute and chronic respiratory failure with hypoxia: Secondary | ICD-10-CM | POA: Diagnosis not present

## 2022-10-02 DIAGNOSIS — R2689 Other abnormalities of gait and mobility: Secondary | ICD-10-CM | POA: Diagnosis not present

## 2022-10-02 DIAGNOSIS — Z9981 Dependence on supplemental oxygen: Secondary | ICD-10-CM | POA: Diagnosis not present

## 2022-10-02 DIAGNOSIS — R5381 Other malaise: Secondary | ICD-10-CM | POA: Diagnosis not present

## 2022-10-02 DIAGNOSIS — G9349 Other encephalopathy: Secondary | ICD-10-CM | POA: Diagnosis not present

## 2022-10-03 DIAGNOSIS — J9621 Acute and chronic respiratory failure with hypoxia: Secondary | ICD-10-CM | POA: Diagnosis not present

## 2022-10-03 DIAGNOSIS — Z9981 Dependence on supplemental oxygen: Secondary | ICD-10-CM | POA: Diagnosis not present

## 2022-10-04 DIAGNOSIS — Z9981 Dependence on supplemental oxygen: Secondary | ICD-10-CM | POA: Diagnosis not present

## 2022-10-04 DIAGNOSIS — R1310 Dysphagia, unspecified: Secondary | ICD-10-CM

## 2022-10-04 DIAGNOSIS — J9621 Acute and chronic respiratory failure with hypoxia: Secondary | ICD-10-CM | POA: Diagnosis not present

## 2022-10-05 DIAGNOSIS — Z9981 Dependence on supplemental oxygen: Secondary | ICD-10-CM | POA: Diagnosis not present

## 2022-10-05 DIAGNOSIS — J9621 Acute and chronic respiratory failure with hypoxia: Secondary | ICD-10-CM | POA: Diagnosis not present

## 2022-10-05 DIAGNOSIS — R1310 Dysphagia, unspecified: Secondary | ICD-10-CM | POA: Diagnosis not present

## 2022-10-06 DIAGNOSIS — R5381 Other malaise: Secondary | ICD-10-CM | POA: Diagnosis not present

## 2022-10-06 DIAGNOSIS — R2689 Other abnormalities of gait and mobility: Secondary | ICD-10-CM | POA: Diagnosis not present

## 2022-10-06 DIAGNOSIS — G9349 Other encephalopathy: Secondary | ICD-10-CM | POA: Diagnosis not present

## 2022-10-06 DIAGNOSIS — J9621 Acute and chronic respiratory failure with hypoxia: Secondary | ICD-10-CM | POA: Diagnosis not present

## 2022-10-06 DIAGNOSIS — Z9911 Dependence on respirator [ventilator] status: Secondary | ICD-10-CM | POA: Diagnosis not present

## 2022-10-06 DIAGNOSIS — Z9981 Dependence on supplemental oxygen: Secondary | ICD-10-CM | POA: Diagnosis not present

## 2022-10-06 DIAGNOSIS — R4189 Other symptoms and signs involving cognitive functions and awareness: Secondary | ICD-10-CM | POA: Diagnosis not present

## 2022-10-07 DIAGNOSIS — I5022 Chronic systolic (congestive) heart failure: Secondary | ICD-10-CM | POA: Diagnosis not present

## 2022-10-07 DIAGNOSIS — Z930 Tracheostomy status: Secondary | ICD-10-CM | POA: Diagnosis not present

## 2022-10-07 DIAGNOSIS — J9621 Acute and chronic respiratory failure with hypoxia: Secondary | ICD-10-CM | POA: Diagnosis not present

## 2022-10-07 DIAGNOSIS — I480 Paroxysmal atrial fibrillation: Secondary | ICD-10-CM | POA: Diagnosis not present

## 2022-10-08 DIAGNOSIS — Z930 Tracheostomy status: Secondary | ICD-10-CM | POA: Diagnosis not present

## 2022-10-08 DIAGNOSIS — I5022 Chronic systolic (congestive) heart failure: Secondary | ICD-10-CM | POA: Diagnosis not present

## 2022-10-08 DIAGNOSIS — J9621 Acute and chronic respiratory failure with hypoxia: Secondary | ICD-10-CM | POA: Diagnosis not present

## 2022-10-08 DIAGNOSIS — I480 Paroxysmal atrial fibrillation: Secondary | ICD-10-CM | POA: Diagnosis not present

## 2022-10-09 DIAGNOSIS — G9349 Other encephalopathy: Secondary | ICD-10-CM | POA: Diagnosis not present

## 2022-10-09 DIAGNOSIS — Z930 Tracheostomy status: Secondary | ICD-10-CM | POA: Diagnosis not present

## 2022-10-09 DIAGNOSIS — J9621 Acute and chronic respiratory failure with hypoxia: Secondary | ICD-10-CM | POA: Diagnosis not present

## 2022-10-09 DIAGNOSIS — R2689 Other abnormalities of gait and mobility: Secondary | ICD-10-CM | POA: Diagnosis not present

## 2022-10-09 DIAGNOSIS — R4189 Other symptoms and signs involving cognitive functions and awareness: Secondary | ICD-10-CM | POA: Diagnosis not present

## 2022-10-09 DIAGNOSIS — R5381 Other malaise: Secondary | ICD-10-CM | POA: Diagnosis not present

## 2022-10-09 DIAGNOSIS — I5022 Chronic systolic (congestive) heart failure: Secondary | ICD-10-CM | POA: Diagnosis not present

## 2022-10-09 DIAGNOSIS — I480 Paroxysmal atrial fibrillation: Secondary | ICD-10-CM | POA: Diagnosis not present

## 2022-10-10 DIAGNOSIS — I480 Paroxysmal atrial fibrillation: Secondary | ICD-10-CM | POA: Diagnosis not present

## 2022-10-10 DIAGNOSIS — J9621 Acute and chronic respiratory failure with hypoxia: Secondary | ICD-10-CM | POA: Diagnosis not present

## 2022-10-10 DIAGNOSIS — Z930 Tracheostomy status: Secondary | ICD-10-CM | POA: Diagnosis not present

## 2022-10-10 DIAGNOSIS — I5022 Chronic systolic (congestive) heart failure: Secondary | ICD-10-CM | POA: Diagnosis not present

## 2022-10-11 DIAGNOSIS — R0902 Hypoxemia: Secondary | ICD-10-CM | POA: Diagnosis not present

## 2022-10-11 DIAGNOSIS — E43 Unspecified severe protein-calorie malnutrition: Secondary | ICD-10-CM | POA: Diagnosis not present

## 2022-10-11 DIAGNOSIS — J9621 Acute and chronic respiratory failure with hypoxia: Secondary | ICD-10-CM | POA: Diagnosis not present

## 2022-10-11 DIAGNOSIS — I5022 Chronic systolic (congestive) heart failure: Secondary | ICD-10-CM | POA: Diagnosis not present

## 2022-10-11 DIAGNOSIS — Z930 Tracheostomy status: Secondary | ICD-10-CM | POA: Diagnosis not present

## 2022-10-11 DIAGNOSIS — A419 Sepsis, unspecified organism: Secondary | ICD-10-CM | POA: Diagnosis not present

## 2022-10-11 DIAGNOSIS — I480 Paroxysmal atrial fibrillation: Secondary | ICD-10-CM | POA: Diagnosis not present

## 2022-10-12 DIAGNOSIS — J9621 Acute and chronic respiratory failure with hypoxia: Secondary | ICD-10-CM | POA: Diagnosis not present

## 2022-10-12 DIAGNOSIS — I480 Paroxysmal atrial fibrillation: Secondary | ICD-10-CM | POA: Diagnosis not present

## 2022-10-12 DIAGNOSIS — Z930 Tracheostomy status: Secondary | ICD-10-CM | POA: Diagnosis not present

## 2022-10-12 DIAGNOSIS — R0902 Hypoxemia: Secondary | ICD-10-CM | POA: Diagnosis not present

## 2022-10-12 DIAGNOSIS — A419 Sepsis, unspecified organism: Secondary | ICD-10-CM | POA: Diagnosis not present

## 2022-10-12 DIAGNOSIS — I5022 Chronic systolic (congestive) heart failure: Secondary | ICD-10-CM | POA: Diagnosis not present

## 2022-10-12 DIAGNOSIS — E43 Unspecified severe protein-calorie malnutrition: Secondary | ICD-10-CM | POA: Diagnosis not present

## 2022-10-13 DIAGNOSIS — Z930 Tracheostomy status: Secondary | ICD-10-CM | POA: Diagnosis not present

## 2022-10-13 DIAGNOSIS — I5022 Chronic systolic (congestive) heart failure: Secondary | ICD-10-CM | POA: Diagnosis not present

## 2022-10-13 DIAGNOSIS — I480 Paroxysmal atrial fibrillation: Secondary | ICD-10-CM | POA: Diagnosis not present

## 2022-10-13 DIAGNOSIS — J9621 Acute and chronic respiratory failure with hypoxia: Secondary | ICD-10-CM | POA: Diagnosis not present

## 2022-10-14 DIAGNOSIS — R4189 Other symptoms and signs involving cognitive functions and awareness: Secondary | ICD-10-CM | POA: Diagnosis not present

## 2022-10-14 DIAGNOSIS — R5381 Other malaise: Secondary | ICD-10-CM | POA: Diagnosis not present

## 2022-10-14 DIAGNOSIS — G9349 Other encephalopathy: Secondary | ICD-10-CM | POA: Diagnosis not present

## 2022-10-14 DIAGNOSIS — J9621 Acute and chronic respiratory failure with hypoxia: Secondary | ICD-10-CM | POA: Diagnosis not present

## 2022-10-14 DIAGNOSIS — R2689 Other abnormalities of gait and mobility: Secondary | ICD-10-CM | POA: Diagnosis not present

## 2022-10-14 DIAGNOSIS — I480 Paroxysmal atrial fibrillation: Secondary | ICD-10-CM | POA: Diagnosis not present

## 2022-10-14 DIAGNOSIS — Z930 Tracheostomy status: Secondary | ICD-10-CM | POA: Diagnosis not present

## 2022-10-14 DIAGNOSIS — I5022 Chronic systolic (congestive) heart failure: Secondary | ICD-10-CM | POA: Diagnosis not present

## 2022-10-15 DIAGNOSIS — Z930 Tracheostomy status: Secondary | ICD-10-CM | POA: Diagnosis not present

## 2022-10-15 DIAGNOSIS — I480 Paroxysmal atrial fibrillation: Secondary | ICD-10-CM | POA: Diagnosis not present

## 2022-10-15 DIAGNOSIS — I5022 Chronic systolic (congestive) heart failure: Secondary | ICD-10-CM | POA: Diagnosis not present

## 2022-10-15 DIAGNOSIS — J9621 Acute and chronic respiratory failure with hypoxia: Secondary | ICD-10-CM | POA: Diagnosis not present

## 2022-10-16 DIAGNOSIS — Z9911 Dependence on respirator [ventilator] status: Secondary | ICD-10-CM | POA: Diagnosis not present

## 2022-10-16 DIAGNOSIS — J9621 Acute and chronic respiratory failure with hypoxia: Secondary | ICD-10-CM | POA: Diagnosis not present

## 2022-10-17 DIAGNOSIS — Z9911 Dependence on respirator [ventilator] status: Secondary | ICD-10-CM | POA: Diagnosis not present

## 2022-10-17 DIAGNOSIS — J9621 Acute and chronic respiratory failure with hypoxia: Secondary | ICD-10-CM | POA: Diagnosis not present

## 2022-10-18 DIAGNOSIS — J9621 Acute and chronic respiratory failure with hypoxia: Secondary | ICD-10-CM | POA: Diagnosis not present

## 2022-10-18 DIAGNOSIS — Z9911 Dependence on respirator [ventilator] status: Secondary | ICD-10-CM | POA: Diagnosis not present

## 2022-10-19 DIAGNOSIS — J9621 Acute and chronic respiratory failure with hypoxia: Secondary | ICD-10-CM | POA: Diagnosis not present

## 2022-10-19 DIAGNOSIS — Z9911 Dependence on respirator [ventilator] status: Secondary | ICD-10-CM | POA: Diagnosis not present

## 2022-10-20 DIAGNOSIS — J9621 Acute and chronic respiratory failure with hypoxia: Secondary | ICD-10-CM | POA: Diagnosis not present

## 2022-10-20 DIAGNOSIS — Z9911 Dependence on respirator [ventilator] status: Secondary | ICD-10-CM | POA: Diagnosis not present

## 2022-10-21 DIAGNOSIS — Z9911 Dependence on respirator [ventilator] status: Secondary | ICD-10-CM | POA: Diagnosis not present

## 2022-10-21 DIAGNOSIS — J9621 Acute and chronic respiratory failure with hypoxia: Secondary | ICD-10-CM | POA: Diagnosis not present

## 2022-10-22 DIAGNOSIS — I480 Paroxysmal atrial fibrillation: Secondary | ICD-10-CM

## 2022-10-22 DIAGNOSIS — I5022 Chronic systolic (congestive) heart failure: Secondary | ICD-10-CM

## 2022-10-22 DIAGNOSIS — J9621 Acute and chronic respiratory failure with hypoxia: Secondary | ICD-10-CM

## 2022-10-22 DIAGNOSIS — Z930 Tracheostomy status: Secondary | ICD-10-CM

## 2022-10-23 DIAGNOSIS — I5022 Chronic systolic (congestive) heart failure: Secondary | ICD-10-CM | POA: Diagnosis not present

## 2022-10-23 DIAGNOSIS — Z930 Tracheostomy status: Secondary | ICD-10-CM | POA: Diagnosis not present

## 2022-10-23 DIAGNOSIS — J9621 Acute and chronic respiratory failure with hypoxia: Secondary | ICD-10-CM | POA: Diagnosis not present

## 2022-10-23 DIAGNOSIS — I480 Paroxysmal atrial fibrillation: Secondary | ICD-10-CM | POA: Diagnosis not present

## 2022-10-24 DIAGNOSIS — I4891 Unspecified atrial fibrillation: Secondary | ICD-10-CM | POA: Diagnosis not present

## 2022-10-25 ENCOUNTER — Inpatient Hospital Stay (HOSPITAL_COMMUNITY): Payer: Medicare Other

## 2022-10-25 ENCOUNTER — Emergency Department (HOSPITAL_COMMUNITY): Payer: Medicare Other

## 2022-10-25 ENCOUNTER — Encounter (HOSPITAL_COMMUNITY): Payer: Self-pay

## 2022-10-25 ENCOUNTER — Inpatient Hospital Stay (HOSPITAL_COMMUNITY)
Admission: EM | Admit: 2022-10-25 | Discharge: 2022-10-29 | DRG: 314 | Disposition: A | Discharge status: Other Acute Inpatient Hospital | Payer: Medicare Other | Attending: Pulmonary Disease | Admitting: Pulmonary Disease

## 2022-10-25 ENCOUNTER — Other Ambulatory Visit: Payer: Self-pay

## 2022-10-25 DIAGNOSIS — Z79890 Hormone replacement therapy (postmenopausal): Secondary | ICD-10-CM

## 2022-10-25 DIAGNOSIS — I341 Nonrheumatic mitral (valve) prolapse: Secondary | ICD-10-CM | POA: Diagnosis present

## 2022-10-25 DIAGNOSIS — L89893 Pressure ulcer of other site, stage 3: Secondary | ICD-10-CM | POA: Diagnosis not present

## 2022-10-25 DIAGNOSIS — Z825 Family history of asthma and other chronic lower respiratory diseases: Secondary | ICD-10-CM

## 2022-10-25 DIAGNOSIS — T83518A Infection and inflammatory reaction due to other urinary catheter, initial encounter: Secondary | ICD-10-CM | POA: Diagnosis present

## 2022-10-25 DIAGNOSIS — L89113 Pressure ulcer of right upper back, stage 3: Secondary | ICD-10-CM | POA: Diagnosis present

## 2022-10-25 DIAGNOSIS — E860 Dehydration: Secondary | ICD-10-CM | POA: Diagnosis present

## 2022-10-25 DIAGNOSIS — I110 Hypertensive heart disease with heart failure: Secondary | ICD-10-CM | POA: Diagnosis present

## 2022-10-25 DIAGNOSIS — J449 Chronic obstructive pulmonary disease, unspecified: Secondary | ICD-10-CM | POA: Diagnosis present

## 2022-10-25 DIAGNOSIS — T85528A Displacement of other gastrointestinal prosthetic devices, implants and grafts, initial encounter: Secondary | ICD-10-CM | POA: Diagnosis present

## 2022-10-25 DIAGNOSIS — Z431 Encounter for attention to gastrostomy: Secondary | ICD-10-CM | POA: Diagnosis not present

## 2022-10-25 DIAGNOSIS — E876 Hypokalemia: Secondary | ICD-10-CM | POA: Diagnosis not present

## 2022-10-25 DIAGNOSIS — R918 Other nonspecific abnormal finding of lung field: Secondary | ICD-10-CM | POA: Diagnosis not present

## 2022-10-25 DIAGNOSIS — K219 Gastro-esophageal reflux disease without esophagitis: Secondary | ICD-10-CM | POA: Diagnosis present

## 2022-10-25 DIAGNOSIS — K9429 Other complications of gastrostomy: Secondary | ICD-10-CM | POA: Diagnosis not present

## 2022-10-25 DIAGNOSIS — E43 Unspecified severe protein-calorie malnutrition: Secondary | ICD-10-CM | POA: Diagnosis present

## 2022-10-25 DIAGNOSIS — J810 Acute pulmonary edema: Secondary | ICD-10-CM | POA: Diagnosis not present

## 2022-10-25 DIAGNOSIS — Z8701 Personal history of pneumonia (recurrent): Secondary | ICD-10-CM

## 2022-10-25 DIAGNOSIS — J9621 Acute and chronic respiratory failure with hypoxia: Secondary | ICD-10-CM | POA: Diagnosis not present

## 2022-10-25 DIAGNOSIS — J984 Other disorders of lung: Secondary | ICD-10-CM | POA: Diagnosis not present

## 2022-10-25 DIAGNOSIS — G20A1 Parkinson's disease without dyskinesia, without mention of fluctuations: Secondary | ICD-10-CM | POA: Diagnosis not present

## 2022-10-25 DIAGNOSIS — G40909 Epilepsy, unspecified, not intractable, without status epilepticus: Secondary | ICD-10-CM | POA: Diagnosis not present

## 2022-10-25 DIAGNOSIS — D62 Acute posthemorrhagic anemia: Secondary | ICD-10-CM | POA: Diagnosis present

## 2022-10-25 DIAGNOSIS — K9423 Gastrostomy malfunction: Secondary | ICD-10-CM | POA: Diagnosis not present

## 2022-10-25 DIAGNOSIS — Z681 Body mass index (BMI) 19 or less, adult: Secondary | ICD-10-CM

## 2022-10-25 DIAGNOSIS — F419 Anxiety disorder, unspecified: Secondary | ICD-10-CM | POA: Diagnosis present

## 2022-10-25 DIAGNOSIS — J9611 Chronic respiratory failure with hypoxia: Secondary | ICD-10-CM | POA: Diagnosis present

## 2022-10-25 DIAGNOSIS — Z7189 Other specified counseling: Secondary | ICD-10-CM

## 2022-10-25 DIAGNOSIS — I482 Chronic atrial fibrillation, unspecified: Secondary | ICD-10-CM

## 2022-10-25 DIAGNOSIS — Z79899 Other long term (current) drug therapy: Secondary | ICD-10-CM

## 2022-10-25 DIAGNOSIS — Z9911 Dependence on respirator [ventilator] status: Secondary | ICD-10-CM | POA: Diagnosis not present

## 2022-10-25 DIAGNOSIS — Y846 Urinary catheterization as the cause of abnormal reaction of the patient, or of later complication, without mention of misadventure at the time of the procedure: Secondary | ICD-10-CM | POA: Diagnosis present

## 2022-10-25 DIAGNOSIS — I340 Nonrheumatic mitral (valve) insufficiency: Secondary | ICD-10-CM | POA: Diagnosis present

## 2022-10-25 DIAGNOSIS — N390 Urinary tract infection, site not specified: Secondary | ICD-10-CM | POA: Diagnosis present

## 2022-10-25 DIAGNOSIS — E039 Hypothyroidism, unspecified: Secondary | ICD-10-CM | POA: Diagnosis present

## 2022-10-25 DIAGNOSIS — F32A Depression, unspecified: Secondary | ICD-10-CM | POA: Diagnosis present

## 2022-10-25 DIAGNOSIS — J9610 Chronic respiratory failure, unspecified whether with hypoxia or hypercapnia: Secondary | ICD-10-CM

## 2022-10-25 DIAGNOSIS — Z930 Tracheostomy status: Secondary | ICD-10-CM

## 2022-10-25 DIAGNOSIS — G250 Essential tremor: Secondary | ICD-10-CM | POA: Diagnosis present

## 2022-10-25 DIAGNOSIS — I4892 Unspecified atrial flutter: Secondary | ICD-10-CM | POA: Diagnosis present

## 2022-10-25 DIAGNOSIS — L89150 Pressure ulcer of sacral region, unstageable: Secondary | ICD-10-CM | POA: Diagnosis present

## 2022-10-25 DIAGNOSIS — J9622 Acute and chronic respiratory failure with hypercapnia: Secondary | ICD-10-CM | POA: Diagnosis not present

## 2022-10-25 DIAGNOSIS — D638 Anemia in other chronic diseases classified elsewhere: Secondary | ICD-10-CM | POA: Diagnosis present

## 2022-10-25 DIAGNOSIS — M8580 Other specified disorders of bone density and structure, unspecified site: Secondary | ICD-10-CM | POA: Diagnosis present

## 2022-10-25 DIAGNOSIS — L89020 Pressure ulcer of left elbow, unstageable: Secondary | ICD-10-CM | POA: Diagnosis present

## 2022-10-25 DIAGNOSIS — I5023 Acute on chronic systolic (congestive) heart failure: Secondary | ICD-10-CM | POA: Diagnosis not present

## 2022-10-25 DIAGNOSIS — J9620 Acute and chronic respiratory failure, unspecified whether with hypoxia or hypercapnia: Secondary | ICD-10-CM | POA: Diagnosis not present

## 2022-10-25 DIAGNOSIS — I38 Endocarditis, valve unspecified: Secondary | ICD-10-CM | POA: Diagnosis not present

## 2022-10-25 DIAGNOSIS — B9689 Other specified bacterial agents as the cause of diseases classified elsewhere: Secondary | ICD-10-CM | POA: Diagnosis present

## 2022-10-25 DIAGNOSIS — R7881 Bacteremia: Secondary | ICD-10-CM | POA: Diagnosis not present

## 2022-10-25 DIAGNOSIS — I480 Paroxysmal atrial fibrillation: Secondary | ICD-10-CM | POA: Diagnosis not present

## 2022-10-25 DIAGNOSIS — Z66 Do not resuscitate: Secondary | ICD-10-CM | POA: Diagnosis present

## 2022-10-25 DIAGNOSIS — Z1624 Resistance to multiple antibiotics: Secondary | ICD-10-CM | POA: Diagnosis present

## 2022-10-25 DIAGNOSIS — R739 Hyperglycemia, unspecified: Secondary | ICD-10-CM | POA: Diagnosis not present

## 2022-10-25 DIAGNOSIS — Z430 Encounter for attention to tracheostomy: Secondary | ICD-10-CM | POA: Diagnosis not present

## 2022-10-25 DIAGNOSIS — J9612 Chronic respiratory failure with hypercapnia: Secondary | ICD-10-CM | POA: Diagnosis not present

## 2022-10-25 DIAGNOSIS — A419 Sepsis, unspecified organism: Secondary | ICD-10-CM | POA: Diagnosis not present

## 2022-10-25 DIAGNOSIS — B958 Unspecified staphylococcus as the cause of diseases classified elsewhere: Secondary | ICD-10-CM

## 2022-10-25 DIAGNOSIS — Z820 Family history of epilepsy and other diseases of the nervous system: Secondary | ICD-10-CM

## 2022-10-25 DIAGNOSIS — Z888 Allergy status to other drugs, medicaments and biological substances status: Secondary | ICD-10-CM

## 2022-10-25 DIAGNOSIS — Z452 Encounter for adjustment and management of vascular access device: Secondary | ICD-10-CM | POA: Diagnosis not present

## 2022-10-25 DIAGNOSIS — R6521 Severe sepsis with septic shock: Secondary | ICD-10-CM | POA: Diagnosis not present

## 2022-10-25 DIAGNOSIS — Z8249 Family history of ischemic heart disease and other diseases of the circulatory system: Secondary | ICD-10-CM

## 2022-10-25 DIAGNOSIS — Z808 Family history of malignant neoplasm of other organs or systems: Secondary | ICD-10-CM

## 2022-10-25 DIAGNOSIS — Z823 Family history of stroke: Secondary | ICD-10-CM

## 2022-10-25 DIAGNOSIS — I959 Hypotension, unspecified: Principal | ICD-10-CM | POA: Diagnosis present

## 2022-10-25 DIAGNOSIS — I2510 Atherosclerotic heart disease of native coronary artery without angina pectoris: Secondary | ICD-10-CM | POA: Diagnosis present

## 2022-10-25 LAB — I-STAT ARTERIAL BLOOD GAS, ED
Acid-Base Excess: 11 mmol/L — ABNORMAL HIGH (ref 0.0–2.0)
Bicarbonate: 38.1 mmol/L — ABNORMAL HIGH (ref 20.0–28.0)
Calcium, Ion: 1.16 mmol/L (ref 1.15–1.40)
HCT: 33 % — ABNORMAL LOW (ref 39.0–52.0)
Hemoglobin: 11.2 g/dL — ABNORMAL LOW (ref 13.0–17.0)
O2 Saturation: 100 %
Patient temperature: 98.1
Potassium: 4.5 mmol/L (ref 3.5–5.1)
Sodium: 141 mmol/L (ref 135–145)
TCO2: 40 mmol/L — ABNORMAL HIGH (ref 22–32)
pCO2 arterial: 63.9 mm[Hg] — ABNORMAL HIGH (ref 32–48)
pH, Arterial: 7.382 (ref 7.35–7.45)
pO2, Arterial: 256 mm[Hg] — ABNORMAL HIGH (ref 83–108)

## 2022-10-25 LAB — URINALYSIS, W/ REFLEX TO CULTURE (INFECTION SUSPECTED)
Bilirubin Urine: NEGATIVE
Glucose, UA: NEGATIVE mg/dL
Ketones, ur: NEGATIVE mg/dL
Nitrite: NEGATIVE
Protein, ur: 100 mg/dL — AB
Specific Gravity, Urine: 1.013 (ref 1.005–1.030)
pH: 9 — ABNORMAL HIGH (ref 5.0–8.0)

## 2022-10-25 LAB — CBC WITH DIFFERENTIAL/PLATELET
Abs Immature Granulocytes: 0 10*3/uL (ref 0.00–0.07)
Basophils Absolute: 0 10*3/uL (ref 0.0–0.1)
Basophils Relative: 0 %
Eosinophils Absolute: 0 10*3/uL (ref 0.0–0.5)
Eosinophils Relative: 0 %
HCT: 23.8 % — ABNORMAL LOW (ref 39.0–52.0)
Hemoglobin: 6.8 g/dL — CL (ref 13.0–17.0)
Lymphocytes Relative: 1 %
Lymphs Abs: 0.1 10*3/uL — ABNORMAL LOW (ref 0.7–4.0)
MCH: 27.8 pg (ref 26.0–34.0)
MCHC: 28.6 g/dL — ABNORMAL LOW (ref 30.0–36.0)
MCV: 97.1 fL (ref 80.0–100.0)
Monocytes Absolute: 0.4 10*3/uL (ref 0.1–1.0)
Monocytes Relative: 4 %
Neutro Abs: 8.6 10*3/uL — ABNORMAL HIGH (ref 1.7–7.7)
Neutrophils Relative %: 95 %
Platelets: 224 10*3/uL (ref 150–400)
RBC: 2.45 MIL/uL — ABNORMAL LOW (ref 4.22–5.81)
RDW: 21.6 % — ABNORMAL HIGH (ref 11.5–15.5)
WBC: 9 10*3/uL (ref 4.0–10.5)
nRBC: 0 % (ref 0.0–0.2)
nRBC: 0 /100{WBCs}

## 2022-10-25 LAB — CBC
HCT: 29 % — ABNORMAL LOW (ref 39.0–52.0)
Hemoglobin: 8.7 g/dL — ABNORMAL LOW (ref 13.0–17.0)
MCH: 28.1 pg (ref 26.0–34.0)
MCHC: 30 g/dL (ref 30.0–36.0)
MCV: 93.5 fL (ref 80.0–100.0)
Platelets: 245 10*3/uL (ref 150–400)
RBC: 3.1 MIL/uL — ABNORMAL LOW (ref 4.22–5.81)
RDW: 20.2 % — ABNORMAL HIGH (ref 11.5–15.5)
WBC: 11.4 10*3/uL — ABNORMAL HIGH (ref 4.0–10.5)
nRBC: 0 % (ref 0.0–0.2)

## 2022-10-25 LAB — COMPREHENSIVE METABOLIC PANEL
ALT: 18 U/L (ref 0–44)
AST: 19 U/L (ref 15–41)
Albumin: <1.5 g/dL — ABNORMAL LOW (ref 3.5–5.0)
Alkaline Phosphatase: 174 U/L — ABNORMAL HIGH (ref 38–126)
Anion gap: 9 (ref 5–15)
BUN: 40 mg/dL — ABNORMAL HIGH (ref 8–23)
CO2: 34 mmol/L — ABNORMAL HIGH (ref 22–32)
Calcium: 7.6 mg/dL — ABNORMAL LOW (ref 8.9–10.3)
Chloride: 99 mmol/L (ref 98–111)
Creatinine, Ser: 0.37 mg/dL — ABNORMAL LOW (ref 0.61–1.24)
GFR, Estimated: >60 mL/min (ref >60)
Glucose, Bld: 93 mg/dL (ref 70–99)
Potassium: 4.7 mmol/L (ref 3.5–5.1)
Sodium: 142 mmol/L (ref 135–145)
Total Bilirubin: 0.3 mg/dL (ref 0.3–1.2)
Total Protein: 6 g/dL — ABNORMAL LOW (ref 6.5–8.1)

## 2022-10-25 LAB — I-STAT CG4 LACTIC ACID, ED
Lactic Acid, Venous: 1.5 mmol/L (ref 0.5–1.9)
Lactic Acid, Venous: 1.3 mmol/L (ref 0.5–1.9)

## 2022-10-25 LAB — GLUCOSE, CAPILLARY
Glucose-Capillary: 80 mg/dL (ref 70–99)
Glucose-Capillary: 84 mg/dL (ref 70–99)
Glucose-Capillary: 93 mg/dL (ref 70–99)
Glucose-Capillary: 98 mg/dL (ref 70–99)

## 2022-10-25 LAB — MRSA NEXT GEN BY PCR, NASAL: MRSA by PCR Next Gen: NOT DETECTED

## 2022-10-25 LAB — CORTISOL: Cortisol, Plasma: 25.1 ug/dL

## 2022-10-25 LAB — PROCALCITONIN: Procalcitonin: 0.19 ng/mL

## 2022-10-25 LAB — PROTIME-INR
INR: 1.3 — ABNORMAL HIGH (ref 0.8–1.2)
Prothrombin Time: 16.1 s — ABNORMAL HIGH (ref 11.4–15.2)

## 2022-10-25 LAB — PREPARE RBC (CROSSMATCH)

## 2022-10-25 LAB — APTT: aPTT: 32 s (ref 24–36)

## 2022-10-25 MED ORDER — SODIUM CHLORIDE 0.9 % IV BOLUS
250.0000 mL | Freq: Once | INTRAVENOUS | Status: AC
  Administered 2022-10-25: 250 mL via INTRAVENOUS

## 2022-10-25 MED ORDER — AMIODARONE LOAD VIA INFUSION
150.0000 mg | Freq: Once | INTRAVENOUS | Status: AC
  Administered 2022-10-25: 150 mg via INTRAVENOUS
  Filled 2022-10-25: qty 83.34

## 2022-10-25 MED ORDER — BUDESONIDE 0.5 MG/2ML IN SUSP
0.5000 mg | Freq: Two times a day (BID) | RESPIRATORY_TRACT | Status: DC
  Administered 2022-10-25 – 2022-10-29 (×9): 0.5 mg via RESPIRATORY_TRACT
  Filled 2022-10-25 (×9): qty 2

## 2022-10-25 MED ORDER — SODIUM CHLORIDE 0.9 % IV SOLN
1.0000 g | Freq: Two times a day (BID) | INTRAVENOUS | Status: DC
  Administered 2022-10-25 – 2022-10-28 (×6): 1 g via INTRAVENOUS
  Filled 2022-10-25 (×6): qty 20

## 2022-10-25 MED ORDER — SERTRALINE HCL 100 MG PO TABS
100.0000 mg | ORAL_TABLET | Freq: Every day | ORAL | Status: DC
  Filled 2022-10-25: qty 1

## 2022-10-25 MED ORDER — SODIUM CHLORIDE 0.9 % IV SOLN: 250.0000 mL | INTRAVENOUS | Status: DC

## 2022-10-25 MED ORDER — PIPERACILLIN-TAZOBACTAM 3.375 G IVPB 30 MIN
3.3750 g | Freq: Once | INTRAVENOUS | Status: AC
  Administered 2022-10-25: 3.375 g via INTRAVENOUS
  Filled 2022-10-25: qty 50

## 2022-10-25 MED ORDER — VANCOMYCIN HCL 1250 MG/250ML IV SOLN
1250.0000 mg | Freq: Once | INTRAVENOUS | Status: AC
  Administered 2022-10-25: 1250 mg via INTRAVENOUS
  Filled 2022-10-25: qty 250

## 2022-10-25 MED ORDER — NOREPINEPHRINE 4 MG/250ML-% IV SOLN
2.0000 ug/min | INTRAVENOUS | Status: DC
  Administered 2022-10-25: 4 ug/min via INTRAVENOUS
  Administered 2022-10-25: 2 ug/min via INTRAVENOUS
  Filled 2022-10-25: qty 250

## 2022-10-25 MED ORDER — SODIUM CHLORIDE 0.9 % IV SOLN
3.0000 g | Freq: Once | INTRAVENOUS | Status: AC
  Administered 2022-10-25: 3 g via INTRAVENOUS
  Filled 2022-10-25: qty 8

## 2022-10-25 MED ORDER — OSMOLITE 1.5 CAL PO LIQD
1000.0000 mL | ORAL | Status: DC
  Filled 2022-10-25: qty 1000

## 2022-10-25 MED ORDER — AMIODARONE HCL 200 MG PO TABS: 200.0000 mg | ORAL_TABLET | Freq: Every day | ORAL | Status: DC

## 2022-10-25 MED ORDER — ORAL CARE MOUTH RINSE
15.0000 mL | OROMUCOSAL | Status: DC
  Administered 2022-10-25 – 2022-10-29 (×49): 15 mL via OROMUCOSAL

## 2022-10-25 MED ORDER — SODIUM CHLORIDE 0.9% FLUSH
10.0000 mL | Freq: Two times a day (BID) | INTRAVENOUS | Status: DC
  Administered 2022-10-25 – 2022-10-26 (×2): 20 mL

## 2022-10-25 MED ORDER — FOLIC ACID 1 MG PO TABS
1.0000 mg | ORAL_TABLET | Freq: Every day | ORAL | Status: DC
  Filled 2022-10-25: qty 1

## 2022-10-25 MED ORDER — DOCUSATE SODIUM 50 MG/5ML PO LIQD: 100.0000 mg | Freq: Two times a day (BID) | ORAL | Status: DC | PRN

## 2022-10-25 MED ORDER — ORAL CARE MOUTH RINSE: 15.0000 mL | OROMUCOSAL | Status: DC | PRN

## 2022-10-25 MED ORDER — LEVALBUTEROL HCL 0.63 MG/3ML IN NEBU: 0.6300 mg | INHALATION_SOLUTION | RESPIRATORY_TRACT | Status: DC | PRN

## 2022-10-25 MED ORDER — PANTOPRAZOLE SODIUM 40 MG IV SOLR
40.0000 mg | Freq: Two times a day (BID) | INTRAVENOUS | Status: DC
  Administered 2022-10-25 – 2022-10-28 (×7): 40 mg via INTRAVENOUS
  Filled 2022-10-25 (×8): qty 10

## 2022-10-25 MED ORDER — DOCUSATE SODIUM 100 MG PO CAPS: 100.0000 mg | ORAL_CAPSULE | Freq: Two times a day (BID) | ORAL | Status: DC | PRN

## 2022-10-25 MED ORDER — NOREPINEPHRINE 4 MG/250ML-% IV SOLN: 0.0000 ug/min | INTRAVENOUS | Status: DC

## 2022-10-25 MED ORDER — POLYETHYLENE GLYCOL 3350 17 G PO PACK: 17.0000 g | PACK | Freq: Every day | ORAL | Status: DC | PRN

## 2022-10-25 MED ORDER — SODIUM CHLORIDE 0.9 % IV BOLUS
1000.0000 mL | Freq: Once | INTRAVENOUS | Status: AC
  Administered 2022-10-25: 1000 mL via INTRAVENOUS

## 2022-10-25 MED ORDER — CHLORHEXIDINE GLUCONATE CLOTH 2 % EX PADS
6.0000 | MEDICATED_PAD | Freq: Every day | CUTANEOUS | Status: DC
  Administered 2022-10-25 – 2022-10-28 (×4): 6 via TOPICAL

## 2022-10-25 MED ORDER — SODIUM CHLORIDE 0.9% FLUSH: 10.0000 mL | INTRAVENOUS | Status: DC | PRN

## 2022-10-25 MED ORDER — AMIODARONE HCL IN DEXTROSE 360-4.14 MG/200ML-% IV SOLN
60.0000 mg/h | INTRAVENOUS | Status: AC
  Administered 2022-10-25 (×2): 60 mg/h via INTRAVENOUS
  Filled 2022-10-25: qty 200

## 2022-10-25 MED ORDER — SODIUM CHLORIDE 0.9% IV SOLUTION: Freq: Once | INTRAVENOUS | Status: DC

## 2022-10-25 MED ORDER — LEVOTHYROXINE SODIUM 25 MCG PO TABS
25.0000 ug | ORAL_TABLET | Freq: Every day | ORAL | Status: DC
  Administered 2022-10-29: 25 ug
  Filled 2022-10-25 (×3): qty 1

## 2022-10-25 MED ORDER — LACTATED RINGERS IV BOLUS
1000.0000 mL | Freq: Once | INTRAVENOUS | Status: AC
  Administered 2022-10-25: 1000 mL via INTRAVENOUS

## 2022-10-25 MED ORDER — AMIODARONE HCL IN DEXTROSE 360-4.14 MG/200ML-% IV SOLN
30.0000 mg/h | INTRAVENOUS | Status: DC
  Administered 2022-10-26 – 2022-10-27 (×5): 30 mg/h via INTRAVENOUS
  Filled 2022-10-25 (×7): qty 200

## 2022-10-25 NOTE — H&P (Signed)
See initial consult note by Anselm Lis and Dr Gaynell Face for H&P  Simonne Martinet ACNP-BC [facility] Pager # [phone] OR # [phone] if no answer

## 2022-10-25 NOTE — ED Notes (Signed)
Spoke with pt's legal guardian Austin Davis to get consent for blood transfusion. She stated she has to escalate to her manager before giving consent.

## 2022-10-25 NOTE — Progress Notes (Signed)
Pharmacy Antibiotic Note  Austin Davis is a 82 y.o. male admitted on 10/25/2022 with  ESBL infection .  Pharmacy has been consulted for meropenem dosing. Patient's Scr is likely falsely low given his history. Will reduce meropenem to q12h dosing.   Plan: Meropenem 1000 mg q12h.   Height: 5\' 10"  (177.8 cm) Weight: 54.6 kg (120 lb 5.9 oz) IBW/kg (Calculated) : 73  Temp (24hrs), Avg:98.4 F (36.9 C), Min:97.8 F (36.6 C), Max:98.9 F (37.2 C)  Recent Labs  Lab 10/25/22 0529 10/25/22 0539 10/25/22 0758  WBC 9.0  --   --   CREATININE 0.37*  --   --   LATICACIDVEN  --  1.5 1.3    Estimated Creatinine Clearance: 55.9 mL/min (A) (by C-G formula based on SCr of 0.37 mg/dL (L)).    Allergies  Allergen Reactions   Prednisone Other (See Comments)    Documented on MAR Unknown reaction   Cortisone Other (See Comments)    Documented on MAR Unknown reaction    Antimicrobials this admission: Vanc 10/5 x 1 Zosyn 10/5 x 1 Unasyn 10/5 x 1 Meropenem 10/5 >>   Dose adjustments this admission: N/a  Microbiology results: 10/5 BCx: pending  10/5 UCx: pending  10/5 Sputum: pending   10/5 MRSA PCR: negative   Thank you for allowing pharmacy to be a part of this patient's care.  Cedric Fishman 10/25/2022 3:30 PM

## 2022-10-25 NOTE — Sepsis Progress Note (Signed)
Elink monitoring for the code sepsis protocol.  

## 2022-10-25 NOTE — ED Notes (Signed)
IV team paged for new [place] IV before blood transfusion.

## 2022-10-25 NOTE — Progress Notes (Signed)
12 Lead obtained and presented to MD

## 2022-10-25 NOTE — Progress Notes (Signed)
MD notified of Pts dislodgment of GJ tube and HR in the 145- 150s. Follow charting for updates

## 2022-10-25 NOTE — Progress Notes (Signed)
Pt came in w chronic foley

## 2022-10-25 NOTE — Progress Notes (Addendum)
Legal guardian called to inform PICC need. Legal guardian unable to provide full consent, needs management approval. Legal guardian made aware of the pts current situation and need for blood products/fluid resuscitation

## 2022-10-25 NOTE — Progress Notes (Incomplete)
Still on pressors.  Lactate negative  PCT unremarkable Cortisol 25 Plan Cont NE infusion  Adding abx for now until cultures are back  -has h/o pseudomonas AND [employer] PNA (pan sens), PRIOR vre uti Mrsa pcr NEG Plan Start meropenem  Simonne Martinet ACNP-BC [facility] Pulmonary/Critical Care Pager # [phone] OR # [phone] if no answer

## 2022-10-25 NOTE — ED Provider Notes (Signed)
[facility]-EMERGENCY DEPT [facility] Emergency Department Provider Note MRN:  [id number]  Arrival date & time: 10/25/22     Chief Complaint   Hypotension   History of Present Illness   Austin Davis is a 82 y.o. year-old male with a history of CHF, chronic hypoxic respiratory failure presenting to the ED with chief complaint of hypotension.  Transferred from LTAC with hypotension.  Received 1 L at the LTAC, 1 L with EMS.  Dislodged GJ tube.  Review of Systems  I was unable to obtain a full/accurate HPI, PMH, or ROS due to the patient's trach dependence.  Patient's Health History    Past Medical History:  Diagnosis Date   Aspiration pneumonia (HCC) 03/25/2021   CHF (congestive heart failure) (HCC)    Coronary artery disease    Essential tremor    GERD (gastroesophageal reflux disease)    GI bleed 2019   hospitalized at [facility] for one week   Headache    since childhood   Hypothyroidism    Low blood sugar    since childhood, controlled by diet   Mitral regurgitation    Mitral valve prolapse    Osteopenia 2021   Paroxysmal atrial fibrillation (HCC)    Seizure-like activity (HCC)    Right gaze preference at baseline, essential termor at baseline, intermittent reduced responsiveness secondary to toxic/metabolic processes and poor cognitive reserve   Tremor     Past Surgical History:  Procedure Laterality Date   COLONOSCOPY WITH PROPOFOL N/A 02/19/2017   Procedure: COLONOSCOPY WITH PROPOFOL;  Surgeon: Charlott Rakes, MD;  Location: [facility] ENDOSCOPY;  Service: Endoscopy;  Laterality: N/A;   IR GASTR TUBE CONVERT GASTR-JEJ PER W/FL MOD SED  09/24/2022   IR GASTROSTOMY TUBE MOD SED  10/18/2020   IR IVC FILTER PLMT / S&I /IMG GUID/MOD SED  11/01/2020   IR REPLACE G-TUBE SIMPLE WO FLUORO  03/15/2022   IR REPLACE G-TUBE SIMPLE WO FLUORO  06/30/2022   IR REPLACE G-TUBE SIMPLE WO FLUORO  09/09/2022   laser eye surgery for retina detachment     MITRAL VALVE REPAIR  01/2003    monitor  02/05/2006   polyp removal     TONSILLECTOMY     tooth removal     as a teenager   TRACHEOSTOMY TUBE PLACEMENT N/A 10/26/2020   Procedure: TRACHEOSTOMY;  Surgeon: Drema Halon, MD;  Location: [facility] OR;  Service: ENT;  Laterality: N/A;    Family History  Problem Relation Age of Onset   Heart disease Mother    CAD Mother    Migraines Mother    Heart failure Father    Skin cancer Father    Valvular heart disease Father        prolapsed valve   Tremor Father        "probably had a bit of tremor in his old age"   Heart failure Maternal Grandmother    Pneumonia Maternal Grandfather    Heart failure Paternal Grandmother    Stroke Paternal Grandfather    Asthma Daughter    Epilepsy Son    Parkinson's disease Neg Hx     Social History   Socioeconomic History   Marital status: Single    Spouse name: Not on file   Number of children: 2   Years of education: 16   Highest education level: Not on file  Occupational History   Occupation: [employer]  Tobacco Use   Smoking status: Never   Smokeless tobacco: Never  Tobacco comments:    only tried a few cigarettes when he was younger  Substance and Sexual Activity   Alcohol use: Yes    Comment: "probably a 6 pack of beer a year"   Drug use: No   Sexual activity: Not on file  Other Topics Concern   Not on file  Social History Narrative   Lives with girlfriend   Caffeine use: rarely    Right handed   Social Determinants of Health   Financial Resource Strain: Not on file  Food Insecurity: Not on file  Transportation Needs: Not on file  Physical Activity: Not on file  Stress: Not on file  Social Connections: Not on file  Intimate Partner Violence: Not on file     Physical Exam   Vitals:   10/25/22 0646 10/25/22 0647  BP:    Pulse: 74 73  Resp: (!) 27 (!) 28  Temp:    SpO2: 92% 92%    CONSTITUTIONAL: Chronically ill-appearing, NAD NEURO/PSYCH: Awake and alert, can answer yes/no  questions. EYES:  eyes equal and reactive ENT/NECK:  no LAD, no JVD CARDIO: Tachycardic rate, well-perfused, normal S1 and S2 PULM:  CTAB no wheezing or rhonchi GI/GU:  non-distended, non-tender, PEG tube is displaced MSK/SPINE:  No gross deformities, no edema SKIN:  no rash, atraumatic   *Additional and/or pertinent findings included in MDM below  Diagnostic and Interventional Summary    EKG Interpretation Date/Time:  Saturday October 25 2022 06:59:14 EDT Ventricular Rate:  129 PR Interval:    QRS Duration:  66 QT Interval:  379 QTC Calculation: 556 R Axis:   80  Text Interpretation: Atrial fibrillation Low voltage, extremity leads ST depression, probably rate related Prolonged QT interval Confirmed by Kennis Carina [phone]) on 10/25/2022 7:44:48 AM       Labs Reviewed  COMPREHENSIVE METABOLIC PANEL - Abnormal; Notable for the following components:      Result Value   CO2 34 (*)    BUN 40 (*)    Creatinine, Ser 0.37 (*)    Calcium 7.6 (*)    Total Protein 6.0 (*)    Albumin <1.5 (*)    Alkaline Phosphatase 174 (*)    All other components within normal limits  CBC WITH DIFFERENTIAL/PLATELET - Abnormal; Notable for the following components:   RBC 2.45 (*)    Hemoglobin 6.8 (*)    HCT 23.8 (*)    MCHC 28.6 (*)    RDW 21.6 (*)    Neutro Abs 8.6 (*)    Lymphs Abs 0.1 (*)    All other components within normal limits  PROTIME-INR - Abnormal; Notable for the following components:   Prothrombin Time 16.1 (*)    INR 1.3 (*)    All other components within normal limits  URINALYSIS, W/ REFLEX TO CULTURE (INFECTION SUSPECTED) - Abnormal; Notable for the following components:   APPearance HAZY (*)    pH 9.0 (*)    Hgb urine dipstick SMALL (*)    Protein, ur 100 (*)    Leukocytes,Ua LARGE (*)    Bacteria, UA MANY (*)    All other components within normal limits  I-STAT ARTERIAL BLOOD GAS, ED - Abnormal; Notable for the following components:   pCO2 arterial 63.9 (*)     pO2, Arterial 256 (*)    Bicarbonate 38.1 (*)    TCO2 40 (*)    Acid-Base Excess 11.0 (*)    HCT 33.0 (*)    Hemoglobin 11.2 (*)  All other components within normal limits  CULTURE, BLOOD (ROUTINE X 2)  CULTURE, BLOOD (ROUTINE X 2)  URINE CULTURE  APTT  I-STAT CG4 LACTIC ACID, ED  I-STAT CG4 LACTIC ACID, ED  TYPE AND SCREEN  PREPARE RBC (CROSSMATCH)    DG Chest Port 1 View  Final Result      Medications  0.9 %  sodium chloride infusion (Manually program via Guardrails IV Fluids) (has no administration in time range)  budesonide (PULMICORT) nebulizer solution 0.5 mg (0.5 mg Nebulization Given 10/25/22 0744)  levalbuterol (XOPENEX) nebulizer solution 0.63 mg (has no administration in time range)  0.9 %  sodium chloride infusion (has no administration in time range)  norepinephrine (LEVOPHED) 4mg  in (0.016 mg/mL) premix infusion (2 mcg/min Intravenous New Bag/Given 10/25/22 0754)  sodium chloride 0.9 % bolus 1,000 mL (1,000 mLs Intravenous New Bag/Given 10/25/22 0557)  piperacillin-tazobactam (ZOSYN) IVPB 3.375 g (0 g Intravenous Stopped 10/25/22 0647)  vancomycin (VANCOREADY) IVPB 1250 mg/250 mL (1,250 mg Intravenous New Bag/Given 10/25/22 0614)  Ampicillin-Sulbactam (UNASYN) 3 g in sodium chloride 0.9 % 100 mL IVPB (0 g Intravenous Stopped 10/25/22 0749)     Procedures  /  Critical Care .Critical Care  Performed by: Sabas Sous, MD Authorized by: Sabas Sous, MD   Critical care provider statement:    Critical care time (minutes):  80   Critical care was necessary to treat or prevent imminent or life-threatening deterioration of the following conditions:  Shock   Critical care was time spent personally by me on the following activities:  Development of treatment plan with patient or surrogate, discussions with consultants, evaluation of patient's response to treatment, examination of patient, ordering and review of laboratory studies, ordering and review of  radiographic studies, ordering and performing treatments and interventions, pulse oximetry, re-evaluation of patient's condition and review of old charts   ED Course and Medical Decision Making  Initial Impression and Ddx Complicated long-term care facility patient with chronic respiratory failure, chronically ventilated, chronic protein deficiency with diffuse anasarca here with hypotension, GJ tube dislodgment.  Considerations include sepsis, dehydration, anemia, electrolyte disturbance.  History of multidrug-resistant pseudomonal infections.  Abdomen soft.  Past medical/surgical history that increases complexity of ED encounter: As above  Interpretation of Diagnostics I personally reviewed the EKG and my interpretation is as follows: A-fib  Labs reveal decreased hemoglobin compared to prior, possible UTI  Patient Reassessment and Ultimate Disposition/Management     Had initially called ICU for admission, they declined given that patient was maintaining his maps.  However despite third liter of fluids patient now with maps in the 50s, starting norepinephrine, plan is for ICU admission.  Had discussed the case with [facility] hospitalist team who will reach out to ICU again.  Also transfusing blood.  GJ tube will need to be replaced by IR.  Patient management required discussion with the following services or consulting groups:  Hospitalist Service and Intensivist Service  Complexity of Problems Addressed Acute illness or injury that poses threat of life of bodily function  Additional Data Reviewed and Analyzed Further history obtained from: EMS on arrival  Additional Factors Impacting ED Encounter Risk Consideration of hospitalization  Elmer Sow. Pilar Plate, MD [facility] Emergency Medicine [facility] [email or web address]  Final Clinical Impressions(s) / ED Diagnoses     ICD-10-CM   1. Hypotension, unspecified hypotension type  I95.9       ED Discharge  Orders     None  Discharge Instructions Discussed with and Provided to Patient:   Discharge Instructions   None      Sabas Sous, MD 10/25/22 [phone]

## 2022-10-25 NOTE — Progress Notes (Signed)
eLink Physician-Brief Progress Note Patient Name: Austin Davis DOB: 10/06/1940 MRN: [id number]   Date of Service  10/25/2022  HPI/Events of Note  82-year-old male with pertinent PMH chronic hypoxic respiratory failure trached on vent, HFrEF, COPD, PAF not on anticoagulation, CAD, hypothyroidism, MDRO infections presents from [facility] to [facility] ED on 10/5 with hypotension.   Called about persistent tachycardia.  Appears that he was in atrial flutter earlier in the day but transitioned to Sinus tachycardia.  Scheduled for amiodarone dose later this evening.  Intermittent a flutter still.  Patient Monitor   ECG 10/5 1320   eICU Interventions  Discontinue oral amiodarone, initiate bolus and amiodarone infusion.   2841 -no enteral access currently available.  Reviewed medications-all enteral meds can be held for the time being.  Reassess TSH in the morning.  Intervention Category Intermediate Interventions: Arrhythmia - evaluation and management  Shalayah Beagley 10/25/2022, 9:18 PM

## 2022-10-25 NOTE — Progress Notes (Addendum)
PICC order received, pt with edema and weeping in BUE. Limited venous access options. Temporary internal jugular recommended. Pt receiving vasopressors. Discussed with Dr. Merrily Pew: benefits outweigh risks, proceed with PICC per MD.

## 2022-10-25 NOTE — Consult Note (Signed)
NAME:  Austin Davis, MRN:  [id number], DOB:  21-Sep-1940, LOS: 0 ADMISSION DATE:  10/25/2022, CONSULTATION DATE:  10/5 REFERRING MD:  Dr. Pilar Plate, CHIEF COMPLAINT:  Hypotension   History of Present Illness:  Patient is a 82 year old male with pertinent PMH chronic hypoxic respiratory failure trached on vent, HFrEF, COPD, PAF not on anticoagulation, CAD, hypothyroidism, MDRO infections presents from [facility] to [facility] ED on 10/5 with hypotension.  Patient recently admitted on 9/1 with malfunctioning PEG tube.  Also noted the patient's BP was borderline hypotensive but antibiotics were deferred at that time.  PEG was replaced by Dr. Dossie Der. GJ tube was also replaced by IR.   On arrival to [facility] ED, patient bp soft 97/56.  Afebrile and WBC 9.  LA 1.5.  UA with large leukocytes.  BC x 2 and UC sent and patient started on Vanco/Zosyn.  Patient placed on home vent settings.  ABG 7.38, 63, 56, 40.  CXR with chronic lung disease with interval filtrate at the left base.  Hgb 6.8.  Patient transfused 1 unit. PEG tube also appears to be disolodged. PCCM consulted for ICU admission.  Pertinent  Medical History   Past Medical History:  Diagnosis Date   Aspiration pneumonia (HCC) 03/25/2021   CHF (congestive heart failure) (HCC)    Coronary artery disease    Essential tremor    GERD (gastroesophageal reflux disease)    GI bleed 2019   hospitalized at [facility] for one week   Headache    since childhood   Hypothyroidism    Low blood sugar    since childhood, controlled by diet   Mitral regurgitation    Mitral valve prolapse    Osteopenia 2021   Paroxysmal atrial fibrillation (HCC)    Seizure-like activity (HCC)    Right gaze preference at baseline, essential termor at baseline, intermittent reduced responsiveness secondary to toxic/metabolic processes and poor cognitive reserve   Tremor      Significant Hospital Events: Including procedures, antibiotic start and stop dates in  addition to other pertinent events   10/5 soft bp; UA indicative of UTI; pccm consulted  Interim History / Subjective:  See above  Objective   Blood pressure (!) 97/56, pulse 90, temperature 98.9 F (37.2 C), temperature source Rectal, resp. rate (!) 26, height 5\' 10"  (1.778 m), weight 54.6 kg, SpO2 100%.    Vent Mode: PCV FiO2 (%):  [40 %] 40 % Set Rate:  [26 bmp] 26 bmp PEEP:  [5 cmH20] 5 cmH20 Plateau Pressure:  [31 cmH20] 31 cmH20  No intake or output data in the 24 hours ending 10/25/22 0620 Filed Weights   10/25/22 0525  Weight: 54.6 kg    Examination: General:  chronically ill appearing trached on mech vent HEENT: MM pink/moist; trach in place Neuro: Aox3; MAE CV: s1s2, tachy 130s, no m/r/g PULM:  dim clear BS bilaterally; on mech vent PCV GI: soft, bsx4 active; GJ tube dislodged  Extremities: warm/dry, ble edema  Skin: no rashes or lesions    Resolved Hospital Problem list     Assessment & Plan:   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 CCS for replacement per  primary  Possible UTI Acute on chronic blood loss anemia Paroxysmal atrial fibrillation, not on anticoagulation outpatient CAD with chronic HFrEF Hypertension Elevated alk phosph Hypothyroidism   Depression/anxiety Severe protein calorie malnutrition Plan: -per primary    Best Practice (right click and "Reselect all SmartList Selections" daily)   Per primary  Labs   CBC: Recent Labs  Lab 10/25/22 0554  HGB 11.2*  HCT 33.0*    Basic Metabolic Panel: Recent Labs  Lab 10/25/22 0529  10/25/22 0554  NA 142 141  K 4.7 4.5  CL 99  --   CO2 34*  --   GLUCOSE 93  --   BUN 40*  --   CREATININE 0.37*  --   CALCIUM 7.6*  --    GFR: Estimated Creatinine Clearance: 55.9 mL/min (A) (by C-G formula based on SCr of 0.37 mg/dL (L)). Recent Labs  Lab 10/25/22 0539  LATICACIDVEN 1.5    Liver Function Tests: Recent Labs  Lab 10/25/22 0529  AST 19  ALT 18  ALKPHOS 174*  BILITOT 0.3  PROT 6.0*  ALBUMIN <1.5*   No results for input(s): "LIPASE", "AMYLASE" in the last 168 hours. No results for input(s): "AMMONIA" in the last 168 hours.  ABG    Component Value Date/Time   PHART 7.382 10/25/2022 0554   PCO2ART 63.9 (H) 10/25/2022 0554   PO2ART 256 (H) 10/25/2022 0554   HCO3 38.1 (H) 10/25/2022 0554   TCO2 40 (H) 10/25/2022 0554   ACIDBASEDEF 2.0 05/30/2021 0052   O2SAT 100 10/25/2022 0554     Coagulation Profile: Recent Labs  Lab 10/25/22 0529  INR 1.3*    Cardiac Enzymes: No results for input(s): "CKTOTAL", "CKMB", "CKMBINDEX", "TROPONINI" in the last 168 hours.  HbA1C: Hgb A1c MFr Bld  Date/Time Value Ref Range Status  09/25/2022 09:10 AM 5.8 (H) 4.8 - 5.6 % Final    Comment:    (NOTE) Pre diabetes:          5.7%-6.4%  Diabetes:              >6.4%  Glycemic control for   <7.0% adults with diabetes   01/04/2022 11:22 PM 5.3 4.8 - 5.6 % Final    Comment:    (NOTE)         Prediabetes: 5.7 - 6.4         Diabetes: >6.4         Glycemic control for adults with diabetes: <7.0     CBG: No results for input(s): "GLUCAP" in the last 168 hours.  Review of Systems:   Patient is trached/vented; therefore, history has been obtained from chart review.    Past Medical History:  He,  has a past medical history of Aspiration pneumonia (HCC) (03/25/2021), CHF (congestive heart failure) (HCC), Coronary artery disease, Essential tremor, GERD (gastroesophageal reflux disease), GI bleed (2019), Headache, Hypothyroidism, Low blood sugar, Mitral  regurgitation, Mitral valve prolapse, Osteopenia (2021), Paroxysmal atrial fibrillation (HCC), Seizure-like activity (HCC), and Tremor.   Surgical History:   Past Surgical History:  Procedure Laterality Date   COLONOSCOPY WITH PROPOFOL N/A 02/19/2017   Procedure: COLONOSCOPY WITH PROPOFOL;  Surgeon: Charlott Rakes, MD;  Location: [facility];  Service: Endoscopy;  Laterality: N/A;   IR GASTR TUBE CONVERT GASTR-JEJ PER W/FL MOD SED  09/24/2022   IR GASTROSTOMY TUBE MOD SED  10/18/2020   IR IVC FILTER PLMT / S&I /IMG GUID/MOD SED  11/01/2020   IR REPLACE G-TUBE SIMPLE WO FLUORO  03/15/2022  IR REPLACE G-TUBE SIMPLE WO FLUORO  06/30/2022   IR REPLACE G-TUBE SIMPLE WO FLUORO  09/09/2022   laser eye surgery for retina detachment     MITRAL VALVE REPAIR  01/2003   monitor  02/05/2006   polyp removal     TONSILLECTOMY     tooth removal     as a teenager   TRACHEOSTOMY TUBE PLACEMENT N/A 10/26/2020   Procedure: TRACHEOSTOMY;  Surgeon: Drema Halon, MD;  Location[facility] OR;  Service: ENT;  Laterality: N/A;     Social History:   reports that he has never smoked. He has never used smokeless tobacco. He reports current alcohol use. He reports that he does not use drugs.   Family History:  His family history includes Asthma in his daughter; CAD in his mother; Epilepsy in his son; Heart disease in his mother; Heart failure in his father, maternal grandmother, and paternal grandmother; Migraines in his mother; Pneumonia in his maternal grandfather; Skin cancer in his father; Stroke in his paternal grandfather; Tremor in his father; Valvular heart disease in his father. There is no history of Parkinson's disease.   Allergies Allergies  Allergen Reactions   Prednisone Other (See Comments)    Documented on MAR Unknown reaction   Cortisone Other (See Comments)    Documented on MAR Unknown reaction     Home Medications  Prior to Admission medications   Medication Sig Start Date End Date  Taking? Authorizing Provider  acetaminophen (TYLENOL) 325 MG tablet Place 650 mg into feeding tube every 8 (eight) hours as needed for fever, headache or moderate pain.    [provider]  amiodarone (PACERONE) 200 MG tablet Place 1 tablet (200 mg total) into feeding tube daily. 07/05/22   Simonne Martinet, NP  amLODipine (NORVASC) 5 MG tablet Place 5 mg into feeding tube daily.    [provider]  budesonide (PULMICORT) 0.5 MG/2ML nebulizer solution Take 0.5 mg by nebulization every 12 (twelve) hours.    [provider]  folic acid (FOLVITE) 1 MG tablet Take 1 tablet (1 mg total) by mouth daily. Patient taking differently: Place 1 mg into feeding tube daily. 09/29/20   Azucena Fallen, MD  gabapentin (NEURONTIN) 100 MG capsule 200 mg See admin instructions. 200 mg via tube every 12 hours.    [provider]  labetalol (NORMODYNE) 5 MG/ML injection Inject 5 mg into the vein every 2 (two) hours as needed (I10 - Essential (primary) hypertension (no indication provided on [facility])).    [provider]  lansoprazole (PREVACID) 30 MG capsule Place 30 mg into feeding tube daily at 12 noon.    [provider]  leptospermum manuka honey (MEDIHONEY) PSTE paste Apply 1 Application topically daily. 09/26/22   Champ Mungo, DO  levalbuterol (XOPENEX) 1.25 MG/3ML nebulizer solution Take 1.25 mg by nebulization 4 (four) times daily.    [provider]  levothyroxine (SYNTHROID) 25 MCG tablet Place 25 mcg into feeding tube daily before breakfast.    [provider]  liver oil-zinc oxide (DESITIN) 40 % ointment Apply topically 2 (two) times daily. 09/25/22   Champ Mungo, DO  LORazepam (ATIVAN) 1 MG tablet Take 1 mg by mouth every 8 (eight) hours as needed for anxiety.    [provider]  Multiple Vitamin (MULTIVITAMIN) tablet Place 1 tablet into feeding tube daily.    [provider]  Nutritional Supplements (FEEDING SUPPLEMENT,  OSMOLITE 1.5 CAL,) LIQD Place 1,000 mLs into feeding tube continuous.  09/10/22   Masters, Katie, DO  Omega-3 Fatty Acids (FISH OIL) 1000 MG CAPS Place 1,000 mg into feeding tube daily.    [provider]  polyethylene glycol (MIRALAX / GLYCOLAX) 17 g packet Place 17 g into feeding tube daily as needed (constipation).    [provider]  sertraline (ZOLOFT) 100 MG tablet Place 100 mg into feeding tube daily.    [provider]  sodium chloride (OCEAN) 0.65 % SOLN nasal spray Place 1 spray into both nostrils every 8 (eight) hours.    [provider]         JD Anselm [facility] 10/25/2022, 6:20 AM  Please see [email or web address] for pager details.  From 7A-7P if no response, please call [phone]. After hours, please call ELink [phone].

## 2022-10-25 NOTE — ED Triage Notes (Signed)
Pt BIB [facility] from [facility] d/t Hypotension/Tachycardia /  G/J tube dislodged and ABD taut and distended.

## 2022-10-25 NOTE — ED Notes (Signed)
ED TO INPATIENT HANDOFF REPORT  ED Nurse Name and Phone #: Dahlia Client [phone]  S Name/Age/Gender Austin Davis 82 y.o. male Room/Bed: 035C/035C  Code Status   Code Status: Full Code  Home/SNF/Other LTAC Patient oriented to: self Is this baseline?     Triage Complete: Triage complete  Chief Complaint Hypotension [I95.9]  Triage Note Pt BIB [facility] d/t Hypotension/Tachycardia /  G/J tube dislodged and ABD taut and distended.     Allergies Allergies  Allergen Reactions   Prednisone Other (See Comments)    Documented on MAR Unknown reaction   Cortisone Other (See Comments)    Documented on MAR Unknown reaction    Level of Care/Admitting Diagnosis ED Disposition     ED Disposition  Admit   Condition  --   Comment  Hospital Area: [facility] [[id number]]  Level of Care: ICU [6]  May admit patient to [facility] or [facility] if equivalent level of care is available:: No  Covid Evaluation: Asymptomatic - no recent exposure (last 10 days) testing not required  Diagnosis: Hypotension [604540]  Admitting Physician: Cheri Fowler [[id number]]  Attending Physician: Cheri Fowler [[id number]]  Certification:: I certify this patient will need inpatient services for at least 2 midnights  Expected Medical Readiness: 10/27/2022          B Medical/Surgery History Past Medical History:  Diagnosis Date   Aspiration pneumonia (HCC) 03/25/2021   CHF (congestive heart failure) (HCC)    Coronary artery disease    Essential tremor    GERD (gastroesophageal reflux disease)    GI bleed 2019   hospitalized at [facility] for one week   Headache    since childhood   Hypothyroidism    Low blood sugar    since childhood, controlled by diet   Mitral regurgitation    Mitral valve prolapse    Osteopenia 2021   Paroxysmal atrial fibrillation (HCC)    Seizure-like activity (HCC)    Right gaze preference at baseline, essential termor at  baseline, intermittent reduced responsiveness secondary to toxic/metabolic processes and poor cognitive reserve   Tremor    Past Surgical History:  Procedure Laterality Date   COLONOSCOPY WITH PROPOFOL N/A 02/19/2017   Procedure: COLONOSCOPY WITH PROPOFOL;  Surgeon: Charlott Rakes, MD;  Location: [facility];  Service: Endoscopy;  Laterality: N/A;   IR GASTR TUBE CONVERT GASTR-JEJ PER W/FL MOD SED  09/24/2022   IR GASTROSTOMY TUBE MOD SED  10/18/2020   IR IVC FILTER PLMT / S&I /IMG GUID/MOD SED  11/01/2020   IR REPLACE G-TUBE SIMPLE WO FLUORO  03/15/2022   IR REPLACE G-TUBE SIMPLE WO FLUORO  06/30/2022   IR REPLACE G-TUBE SIMPLE WO FLUORO  09/09/2022   laser eye surgery for retina detachment     MITRAL VALVE REPAIR  01/2003   monitor  02/05/2006   polyp removal     TONSILLECTOMY     tooth removal     as a teenager   TRACHEOSTOMY TUBE PLACEMENT N/A 10/26/2020   Procedure: TRACHEOSTOMY;  Surgeon: Drema Halon, MD;  Location: [facility] OR;  Service: ENT;  Laterality: N/A;     A IV Location/Drains/Wounds Patient Lines/Drains/Airways Status     Active Line/Drains/Airways     Name Placement date Placement time Site Days   Peripheral IV 10/25/22 24 G 1" Anterior;Left Hand 10/25/22  0521  Hand  less than 1   Peripheral IV 10/25/22 20 G Right Antecubital 10/25/22  0552  Antecubital  less than 1   Peripheral IV 10/25/22 20 G 1.88" Left Antecubital 10/25/22  0806  Antecubital  less than 1   Gastrostomy/Enterostomy PEG-jejunostomy 24 Fr. LUQ 09/24/22  1551  LUQ  31   Tracheostomy Other (Comment) 7 mm Cuffed 09/09/22  --  7 mm  46   Tracheostomy Portex 8 mm Cuffed --  --  8 mm  --   Pressure Injury 09/21/22 Coccyx Unstageable - Full thickness tissue loss in which the base of the injury is covered by slough (yellow, tan, gray, green or brown) and/or eschar (tan, brown or black) in the wound bed. black red area on sacrum 09/21/22  1700  -- 34   Pressure Injury 09/21/22 Ischial tuberosity Left  Stage 1 -  Intact skin with non-blanchable redness of a localized area usually over a bony prominence. redness, non blanchable 09/21/22  1700  -- 34   Wound / Incision (Open or Dehisced) 09/21/22 Pretibial Left abrasion 09/21/22  1700  Pretibial  34            Intake/Output Last 24 hours  Intake/Output Summary (Last 24 hours) at 10/25/2022 1025 Last data filed at 10/25/2022 1610 Gross per 24 hour  Intake 1300 ml  Output --  Net 1300 ml    Labs/Imaging Results for orders placed or performed during the hospital encounter of 10/25/22 (from the past 48 hour(s))  Comprehensive metabolic panel     Status: Abnormal   Collection Time: 10/25/22  5:29 AM  Result Value Ref Range   Sodium 142 135 - 145 mmol/L   Potassium 4.7 3.5 - 5.1 mmol/L   Chloride 99 98 - 111 mmol/L   CO2 34 (H) 22 - 32 mmol/L   Glucose, Bld 93 70 - 99 mg/dL    Comment: Glucose reference range applies only to samples taken after fasting for at least 8 hours.   BUN 40 (H) 8 - 23 mg/dL   Creatinine, Ser 9.60 (L) 0.61 - 1.24 mg/dL   Calcium 7.6 (L) 8.9 - 10.3 mg/dL   Total Protein 6.0 (L) 6.5 - 8.1 g/dL   Albumin <4.5 (L) 3.5 - 5.0 g/dL   AST 19 15 - 41 U/L   ALT 18 0 - 44 U/L   Alkaline Phosphatase 174 (H) 38 - 126 U/L   Total Bilirubin 0.3 0.3 - 1.2 mg/dL   GFR, Estimated >40 >98 mL/min    Comment: (NOTE) Calculated using the CKD-EPI Creatinine Equation (2021)    Anion gap 9 5 - 15    Comment: Performed at [facility], [address], [place], [place] [address]  CBC with Differential     Status: Abnormal   Collection Time: 10/25/22  5:29 AM  Result Value Ref Range   WBC 9.0 4.0 - 10.5 K/uL   RBC 2.45 (L) 4.22 - 5.81 MIL/uL   Hemoglobin 6.8 (LL) 13.0 - 17.0 g/dL    Comment: REPEATED TO VERIFY THIS CRITICAL RESULT HAS VERIFIED AND BEEN CALLED TO MODESTY FAIRLEY RN BY BERTRAM TAYLOR ON 10 05 2024 AT 0621, AND HAS BEEN READ BACK.     HCT 23.8 (L) 39.0 - 52.0 %   MCV 97.1 80.0 - 100.0 fL   MCH 27.8  26.0 - 34.0 pg   MCHC 28.6 (L) 30.0 - 36.0 g/dL   RDW 78.2 (H) 95.6 - 21.3 %   Platelets 224 150 - 400 K/uL   nRBC 0.0 0.0 - 0.2 %   Neutrophils Relative % 95 %  Neutro Abs 8.6 (H) 1.7 - 7.7 K/uL   Lymphocytes Relative 1 %   Lymphs Abs 0.1 (L) 0.7 - 4.0 K/uL   Monocytes Relative 4 %   Monocytes Absolute 0.4 0.1 - 1.0 K/uL   Eosinophils Relative 0 %   Eosinophils Absolute 0.0 0.0 - 0.5 K/uL   Basophils Relative 0 %   Basophils Absolute 0.0 0.0 - 0.1 K/uL   nRBC 0 0 /100 WBC   Abs Immature Granulocytes 0.00 0.00 - 0.07 K/uL   Tear Drop Cells PRESENT    Polychromasia PRESENT    Basophilic Stippling PRESENT     Comment: Performed at [facility], [address], [place], [place] [address]  Protime-INR     Status: Abnormal   Collection Time: 10/25/22  5:29 AM  Result Value Ref Range   Prothrombin Time 16.1 (H) 11.4 - 15.2 seconds   INR 1.3 (H) 0.8 - 1.2    Comment: (NOTE) INR goal varies based on device and disease states. Performed at [facility], [address] [place], [place] [address]   APTT     Status: None   Collection Time: 10/25/22  5:29 AM  Result Value Ref Range   aPTT 32 24 - 36 seconds    Comment: Performed at [facility], [address] [place], [place] [address]  I-Stat Lactic Acid, ED     Status: None   Collection Time: 10/25/22  5:39 AM  Result Value Ref Range   Lactic Acid, Venous 1.5 0.5 - 1.9 mmol/L  Urinalysis, w/ Reflex to Culture (Infection Suspected) -Urine, Clean Catch     Status: Abnormal   Collection Time: 10/25/22  5:54 AM  Result Value Ref Range   Specimen Source URINE, CLEAN CATCH    Color, Urine YELLOW YELLOW   APPearance HAZY (A) CLEAR   Specific Gravity, Urine 1.013 1.005 - 1.030   pH 9.0 (H) 5.0 - 8.0   Glucose, UA NEGATIVE NEGATIVE mg/dL   Hgb urine dipstick SMALL (A) NEGATIVE   Bilirubin Urine NEGATIVE NEGATIVE   Ketones, ur NEGATIVE NEGATIVE mg/dL   Protein, ur 841 (A) NEGATIVE mg/dL   Nitrite NEGATIVE  NEGATIVE   Leukocytes,Ua LARGE (A) NEGATIVE   RBC / HPF 6-10 0 - 5 RBC/hpf   WBC, UA 21-50 0 - 5 WBC/hpf    Comment:        Reflex urine culture not performed if WBC <=10, OR if Squamous epithelial cells >5. If Squamous epithelial cells >5 suggest recollection.    Bacteria, UA MANY (A) NONE SEEN   Squamous Epithelial / HPF 0-5 0 - 5 /HPF   Triple Phosphate Crystal PRESENT     Comment: Performed at [facility], [address] [place], [place] [address]  I-Stat arterial blood gas, ED     Status: Abnormal   Collection Time: 10/25/22  5:54 AM  Result Value Ref Range   pH, Arterial 7.382 7.35 - 7.45   pCO2 arterial 63.9 (H) 32 - 48 mmHg   pO2, Arterial 256 (H) 83 - 108 mmHg   Bicarbonate 38.1 (H) 20.0 - 28.0 mmol/L   TCO2 40 (H) 22 - 32 mmol/L   O2 Saturation 100 %   Acid-Base Excess 11.0 (H) 0.0 - 2.0 mmol/L   Sodium 141 135 - 145 mmol/L   Potassium 4.5 3.5 - 5.1 mmol/L   Calcium, Ion 1.16 1.15 - 1.40 mmol/L   HCT 33.0 (L) 39.0 - 52.0 %  Hemoglobin 11.2 (L) 13.0 - 17.0 g/dL   Patient temperature 29.5 F    Sample type ARTERIAL   Type and screen [facility]     Status: None (Preliminary result)   Collection Time: 10/25/22  6:55 AM  Result Value Ref Range   ABO/RH(D) O POS    Antibody Screen NEG    Sample Expiration 10/28/2022,2359    Unit Number [id number]    Blood Component Type RED CELLS,LR    Unit division 00    Status of Unit ALLOCATED    Transfusion Status OK TO TRANSFUSE    Crossmatch Result      Compatible Performed at [facility], [address], [place], [place] [address]   Prepare RBC (crossmatch)     Status: None   Collection Time: 10/25/22  7:00 AM  Result Value Ref Range   Order Confirmation      ORDER PROCESSED BY BLOOD BANK Performed at [facility], [address], [place], [place] [address]   I-Stat Lactic Acid, ED     Status: None   Collection Time: 10/25/22  7:58 AM  Result Value Ref Range   Lactic Acid,  Venous 1.3 0.5 - 1.9 mmol/L   [place] EKG SITE RITE  Result Date: 10/25/2022 If Site Rite image not attached, placement could not be confirmed due to current cardiac rhythm.  DG Chest Port 1 View  Result Date: 10/25/2022 CLINICAL DATA:  Questionable sepsis EXAM: PORTABLE CHEST 1 VIEW COMPARISON:  09/25/2022 FINDINGS: Patchy pulmonary opacity greatest at the left base where there is an increase from prior. Small pleural effusions are possible. No pneumothorax. Normal heart size and mediastinal contours. Located tracheostomy tube. IMPRESSION: Chronic lung disease with interval infiltrate at the left base. Electronically Signed   By: Tiburcio Pea M.D.   On: 10/25/2022 06:02    Pending Labs Unresulted Labs (From admission, onward)     Start     Ordered   10/25/22 1004  Procalcitonin  Daily,   R     References:    Procalcitonin Lower Respiratory Tract Infection AND Sepsis Procalcitonin Algorithm   10/25/22 1003   10/25/22 0952  Cortisol  ONCE - URGENT,   URGENT        10/25/22 0951   10/25/22 0952  Culture, Respiratory w Gram Stain (tracheal aspirate)  Once,   R        10/25/22 0951   10/25/22 0554  Urine Culture  Once,   R        10/25/22 0554   10/25/22 0526  Blood Culture (routine x 2)  (Undifferentiated presentation (screening labs and basic nursing orders))  BLOOD CULTURE X 2,   STAT      10/25/22 0526            Vitals/Pain Today's Vitals   10/25/22 0925 10/25/22 0930 10/25/22 0935 10/25/22 0952  BP: 96/60 100/70 (!) 85/60 (!) 91/53  Pulse: 100 100 100 96  Resp: (!) 26 (!) 23 (!) 26 (!) 26  Temp:    98 F (36.7 C)  TempSrc:    Oral  SpO2: 99% 100% 99% 99%  Weight:      Height:        Isolation Precautions No active isolations  Medications Medications  0.9 %  sodium chloride infusion (Manually program via Guardrails IV Fluids) (0 mLs Intravenous Hold 10/25/22 0936)  budesonide (PULMICORT) nebulizer solution 0.5 mg (0.5 mg Nebulization Given 10/25/22 0744)   levalbuterol (  XOPENEX) nebulizer solution 0.63 mg (has no administration in time range)  0.9 %  sodium chloride infusion (0 mLs Intravenous Hold 10/25/22 0936)  norepinephrine (LEVOPHED) 4mg  in (0.016 mg/mL) premix infusion (4 mcg/min Intravenous Rate/Dose Change 10/25/22 0937)  amiodarone (PACERONE) tablet 200 mg (has no administration in time range)  sertraline (ZOLOFT) tablet 100 mg (has no administration in time range)  levothyroxine (SYNTHROID) tablet 25 mcg (has no administration in time range)  folic acid (FOLVITE) tablet 1 mg (has no administration in time range)  feeding supplement (OSMOLITE 1.5 CAL) liquid 1,000 mL (has no administration in time range)  polyethylene glycol (MIRALAX / GLYCOLAX) packet 17 g (has no administration in time range)  docusate (COLACE) 50 MG/5ML liquid 100 mg (has no administration in time range)  sodium chloride 0.9 % bolus 1,000 mL (0 mLs Intravenous Stopped 10/25/22 0812)  piperacillin-tazobactam (ZOSYN) IVPB 3.375 g (0 g Intravenous Stopped 10/25/22 0647)  vancomycin (VANCOREADY) IVPB 1250 mg/250 mL (0 mg Intravenous Stopped 10/25/22 0744)  Ampicillin-Sulbactam (UNASYN) 3 g in sodium chloride 0.9 % 100 mL IVPB (0 g Intravenous Stopped 10/25/22 0749)  lactated ringers bolus 1,000 mL (1,000 mLs Intravenous New Bag/Given 10/25/22 1007)    Mobility non-ambulatory     Focused Assessments Cardiac Assessment Handoff:  Cardiac Rhythm: Atrial fibrillation Lab Results  Component Value Date   CKTOTAL 26 (L) 09/22/2020   Lab Results  Component Value Date   DDIMER <0.27 09/16/2020   Does the Patient currently have chest pain? No   , Pulmonary Assessment Handoff:  Lung sounds:   O2 Device: Ventilator      R Recommendations: See Admitting Provider Note  Report given to:   Additional Notes: Trach vented; pressors

## 2022-10-25 NOTE — Progress Notes (Addendum)
ED Pharmacy Antibiotic Sign Off An antibiotic consult was received from an ED provider for vancomycin and unasyn per pharmacy dosing for sepsis. A chart review was completed to assess appropriateness.   The following one time order(s) were placed:  Vancomycin 1250mg  x 1 Unasyn 3g x 1  Further antibiotic and/or antibiotic pharmacy consults should be ordered by the admitting provider if indicated.   Thank you for allowing pharmacy to be a part of this patient's care.   Marja Kays, [facility]  Clinical Pharmacist 10/25/22 6:05 AM

## 2022-10-25 NOTE — Progress Notes (Signed)
IV team to bedside to discuss vascular access with primary RN and MD. Pt is appropriate candidate for PICC line; orders to be placed per MD. RN to consult IV team for further concerns.

## 2022-10-25 NOTE — ED Provider Notes (Addendum)
  Physical Exam  BP (!) 84/67   Pulse 73   Temp 98.9 F (37.2 C) (Rectal)   Resp (!) 28   Ht 5\' 10"  (1.778 m)   Wt 54.6 kg   SpO2 92%   BMI 17.27 kg/m   Physical Exam  Procedures  .Critical Care  Performed by: Derwood Kaplan, MD Authorized by: Derwood Kaplan, MD   Critical care provider statement:    Critical care time (minutes):  30   Critical care was necessary to treat or prevent imminent or life-threatening deterioration of the following conditions:  Circulatory failure and sepsis   Critical care was time spent personally by me on the following activities:  Development of treatment plan with patient or surrogate, discussions with consultants, evaluation of patient's response to treatment, examination of patient, ordering and review of laboratory studies, ordering and review of radiographic studies, ordering and performing treatments and interventions, pulse oximetry, re-evaluation of patient's condition and review of old charts   ED Course / MDM    Medical Decision Making Amount and/or Complexity of Data Reviewed Labs: ordered. Radiology: ordered. ECG/medicine tests: ordered.  Risk Prescription drug management. Decision regarding hospitalization.   Pt comes in from [facility]. Complex medical hx. Trach dependent. Peg tube dislodged. Hypotensive. Has 3 liters ordered. Extremely low albumin.   Has new anemia. Hb is 6.8. Has infection, UTI - hx of MDR. BUN - 40, Alb < 1.5  CCM consulted - they recommend progressive unit admission. Pt's BP dropped off of pressors. Will re-initiate pressor.  He has already received adequate fluids and in the setting of low albumin, could still be intravascularly depleted.  PRBC ordered. Broad spectrum antibiotics ordered. Sepsis reassessment completed.  Pt looks chronically ill.   8:05 AM Gen Surgery -Dr. Pilar Plate  spoke with Elmer Sow,  APP.    8:55 AM Reassessed. Remains on pressors and blood ordered. After discussing with  nursing staff, will order PICC.  Spoke w/ CCM - they will admit.     Derwood Kaplan, MD 10/25/22 [phone]

## 2022-10-25 NOTE — Progress Notes (Signed)
Per MD to remove GJ tube and place foley cathter in place

## 2022-10-25 NOTE — Progress Notes (Signed)
On arrival to ICU, pt's PEG tube noted to be dislodged. Dr. Merrily Pew notified. Abdominal site covered until further instructions given.

## 2022-10-25 NOTE — Progress Notes (Addendum)
NAME:  Austin Davis, MRN:  [id number], DOB:  March 10, 1940, LOS: 0 ADMISSION DATE:  10/25/2022, CONSULTATION DATE:  10/5 REFERRING MD:  Dr. Pilar Plate, CHIEF COMPLAINT:  Hypotension   History of Present Illness:  Patient is a 82 year old male with pertinent PMH chronic hypoxic respiratory failure trached on vent, HFrEF, COPD, PAF not on anticoagulation, CAD, hypothyroidism, MDRO infections presents from [facility] to [facility] ED on 10/5 with hypotension.  Patient recently admitted on 9/1 with malfunctioning PEG tube.  Also noted the patient's BP was borderline hypotensive but antibiotics were deferred at that time.  PEG was replaced by Dr. Dossie Der. GJ tube was also replaced by IR.   On arrival to [facility] ED, patient bp soft 97/56.  Afebrile and WBC 9.  LA 1.5.  UA with large leukocytes.  BC x 2 and UC sent and patient started on Vanco/Zosyn.  Patient placed on home vent settings.  ABG 7.38, 63, 56, 40.  CXR with chronic lung disease with interval filtrate at the left base.  Hgb 6.8.  Patient transfused 1 unit. PEG tube also appears to be disolodged. PCCM consulted for ICU admission.  Pertinent  Medical History   Past Medical History:  Diagnosis Date   Aspiration pneumonia (HCC) 03/25/2021   CHF (congestive heart failure) (HCC)    Coronary artery disease    Essential tremor    GERD (gastroesophageal reflux disease)    GI bleed 2019   hospitalized at [facility] for one week   Headache    since childhood   Hypothyroidism    Low blood sugar    since childhood, controlled by diet   Mitral regurgitation    Mitral valve prolapse    Osteopenia 2021   Paroxysmal atrial fibrillation (HCC)    Seizure-like activity (HCC)    Right gaze preference at baseline, essential termor at baseline, intermittent reduced responsiveness secondary to toxic/metabolic processes and poor cognitive reserve   Tremor      Significant Hospital Events: Including procedures, antibiotic start and stop dates in  addition to other pertinent events   10/5 soft bp; UA indicative of UTI; pccm consulted  Interim History / Subjective:  See above  Objective   Blood pressure (!) 85/60, pulse 100, temperature 98 F (36.7 C), temperature source Oral, resp. rate (!) 26, height 5\' 10"  (1.778 m), weight 54.6 kg, SpO2 99%.    Vent Mode: PCV FiO2 (%):  [40 %] 40 % Set Rate:  [26 bmp] 26 bmp PEEP:  [5 cmH20] 5 cmH20 Plateau Pressure:  [31 cmH20-33 cmH20] 33 cmH20   Intake/Output Summary (Last 24 hours) at 10/25/2022 0953 Last data filed at 10/25/2022 7829 Gross per 24 hour  Intake 1300 ml  Output --  Net 1300 ml   Filed Weights   10/25/22 0525  Weight: 54.6 kg    Examination: General:  chronically ill appearing trached on mech vent HEENT: MM pink/moist; trach in place Neuro: Aox3; MAE CV: s1s2, tachy 130s, no m/r/g PULM:  dim clear BS bilaterally; on mech vent PCV GI: soft, bsx4 active; GJ tube dislodged  Extremities: warm/dry, ble edema  Skin: no rashes or lesions    Resolved Hospital Problem list     Assessment & Plan:   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  Plan Culture blood and sputum  Ck cortisol Ck PCT Hold off on further abx for now. Not convinced he is infected Hold norvasc  Acute on chronic blood loss anemia Plan Transfuse 1 unit  F/u cbc Hold AC  Paroxysmal atrial fibrillation, not on anticoagulation outpatient CAD with chronic HFrEF Plan Cont amiodarone  Tele   Elevated alk phosph Plan Consider RUQ [place]  Hypothyroidism    Plan  Cont replacement   Depression/anxiety Plan Cont zoloft  Severe protein calorie malnutrition Plan: tfs    Best Practice (right click and "Reselect all SmartList Selections" daily)   Best Practice (right click and "Reselect all SmartList Selections" daily)   Diet/type: tubefeeds DVT prophylaxis: SCD GI prophylaxis: PPI Lines: N/A Foley:  Yes, and it is still needed Code Status:  full code Last date of multidisciplinary goals of care discussion [pending]    34 min

## 2022-10-25 NOTE — Progress Notes (Signed)
RT transported pt from ED35 to 3M09 on ventilator without any complicatins. RN at bedside.

## 2022-10-26 DIAGNOSIS — A419 Sepsis, unspecified organism: Secondary | ICD-10-CM | POA: Diagnosis not present

## 2022-10-26 DIAGNOSIS — R6521 Severe sepsis with septic shock: Secondary | ICD-10-CM

## 2022-10-26 LAB — BPAM RBC
Blood Product Expiration Date: 202410292359
ISSUE DATE / TIME: 202410051715
Unit Type and Rh: 5100

## 2022-10-26 LAB — BASIC METABOLIC PANEL
Anion gap: 11 (ref 5–15)
BUN: 34 mg/dL — ABNORMAL HIGH (ref 8–23)
CO2: 31 mmol/L (ref 22–32)
Calcium: 7.4 mg/dL — ABNORMAL LOW (ref 8.9–10.3)
Chloride: 98 mmol/L (ref 98–111)
Creatinine, Ser: 0.48 mg/dL — ABNORMAL LOW (ref 0.61–1.24)
GFR, Estimated: >60 mL/min (ref >60)
Glucose, Bld: 217 mg/dL — ABNORMAL HIGH (ref 70–99)
Potassium: 3.6 mmol/L (ref 3.5–5.1)
Sodium: 140 mmol/L (ref 135–145)

## 2022-10-26 LAB — BLOOD CULTURE ID PANEL (REFLEXED) - BCID2

## 2022-10-26 LAB — TYPE AND SCREEN
ABO/RH(D): O POS
Antibody Screen: NEGATIVE
Unit division: 0

## 2022-10-26 LAB — T4, FREE: Free T4: 0.93 ng/dL (ref 0.61–1.12)

## 2022-10-26 LAB — GLUCOSE, CAPILLARY
Glucose-Capillary: 87 mg/dL (ref 70–99)
Glucose-Capillary: 71 mg/dL (ref 70–99)
Glucose-Capillary: 106 mg/dL — ABNORMAL HIGH (ref 70–99)
Glucose-Capillary: 100 mg/dL — ABNORMAL HIGH (ref 70–99)
Glucose-Capillary: 100 mg/dL — ABNORMAL HIGH (ref 70–99)
Glucose-Capillary: 136 mg/dL — ABNORMAL HIGH (ref 70–99)

## 2022-10-26 LAB — CBC
HCT: 25.5 % — ABNORMAL LOW (ref 39.0–52.0)
Hemoglobin: 7.8 g/dL — ABNORMAL LOW (ref 13.0–17.0)
MCH: 29 pg (ref 26.0–34.0)
MCHC: 30.6 g/dL (ref 30.0–36.0)
MCV: 94.8 fL (ref 80.0–100.0)
Platelets: 201 10*3/uL (ref 150–400)
RBC: 2.69 MIL/uL — ABNORMAL LOW (ref 4.22–5.81)
RDW: 20 % — ABNORMAL HIGH (ref 11.5–15.5)
WBC: 8.6 10*3/uL (ref 4.0–10.5)
nRBC: 0.2 % (ref 0.0–0.2)

## 2022-10-26 LAB — TSH: TSH: 5.999 u[IU]/mL — ABNORMAL HIGH (ref 0.350–4.500)

## 2022-10-26 LAB — PROCALCITONIN: Procalcitonin: 0.19 ng/mL

## 2022-10-26 MED ORDER — POTASSIUM CHLORIDE 10 MEQ/50ML IV SOLN
10.0000 meq | INTRAVENOUS | Status: AC
  Administered 2022-10-26 (×4): 10 meq via INTRAVENOUS
  Filled 2022-10-26 (×4): qty 50

## 2022-10-26 MED ORDER — LINEZOLID 600 MG/300ML IV SOLN
600.0000 mg | Freq: Two times a day (BID) | INTRAVENOUS | Status: DC
  Administered 2022-10-26 – 2022-10-27 (×4): 600 mg via INTRAVENOUS
  Filled 2022-10-26 (×4): qty 300

## 2022-10-26 MED ORDER — DEXTROSE-SODIUM CHLORIDE 5-0.45 % IV SOLN: INTRAVENOUS | Status: DC

## 2022-10-26 MED ORDER — LINEZOLID 600 MG PO TABS
600.0000 mg | ORAL_TABLET | Freq: Two times a day (BID) | ORAL | Status: DC
  Filled 2022-10-26: qty 1

## 2022-10-26 NOTE — Progress Notes (Signed)
[facility] ADULT ICU REPLACEMENT PROTOCOL   The patient does apply for the [facility] Adult ICU Electrolyte Replacment Protocol based on the criteria listed below:   1.Exclusion criteria: TCTS, ECMO, Dialysis, and Myasthenia Gravis patients 2. Is GFR >/= 30 ml/min? Yes.    Patient's GFR today is >60 3. Is SCr </= 2? Yes.   Patient's SCr is 0.48 mg/dL 4. Did SCr increase >/= 0.5 in 24 hours? No. 5.Pt's weight >40kg  Yes.   6. Abnormal electrolyte(s): K+3.6  7. Electrolytes replaced per protocol 8.  Call MD STAT for K+ </= 2.5, Phos </= 1, or Mag </= 1 Physician:  Dr. Loralyn Freshwater, Lilia Argue 10/26/2022 4:55 AM

## 2022-10-26 NOTE — Progress Notes (Signed)
Initial Nutrition Assessment  DOCUMENTATION CODES:   Underweight  INTERVENTION:  Initiate tube feeding via PEG: Once placed and confirmed, medically safe.  Osmolite 1.5 at 60 ml/h (1440 ml per day) Start at 30ml and advance 10ml every 4 hr to goal free water every 4 hr   Provides 2160 kcal, 90 gm protein, 2297 ml free water daily  When TF at goal, 1 packet Juven BID, each packet provides 95 calories, 2.5 grams of protein (collagen), and 9.8 grams of carbohydrate (3 grams sugar); also contains 7 grams of L-arginine and L-glutamine, 300 mg vitamin C, 15 mg vitamin E, 1.2 mcg vitamin B-12, 9.5 mg zinc, 200 mg calcium, and 1.5 g  Calcium Beta-hydroxy-Beta-methylbutyrate to support wound healing    NUTRITION DIAGNOSIS:   Increased nutrient needs related to chronic illness as evidenced by estimated needs.    GOAL:   Patient will meet greater than or equal to 90% of their needs    MONITOR:   Labs, Weight trends, TF tolerance, I & O's  REASON FOR ASSESSMENT:   Consult Enteral/tube feeding initiation and management  ASSESSMENT:  82 year old male with chronic hypoxic respiratory failure, ventilator dependent, CHF, CAD, GERD, Admitted with sepsis with septic shock due to staph bacteremia, A-fib with RVR and dislodgment of PEG tube Pt has been followed by RD team in the past and has a history of malnutrition. When able, will initiate Osmolite 1.5 @ 60. Team reached out. Feeding pending IR and tube placement. MD requesting hold on TF. Placing on IV fluid CT Scheduled   Review of EMR revealed; 8/21 -admitted for PEG dislodgement- Gastrostomy 9/1- admitted PEG dislodgment- Gastrostomy 9/4- placed PEG-jejunostomy 10/05- admitted for PEG dislodgement  10/6- IR scheduled PEG replacement  Admit weight: 54.9 kg Current weight: 54.9 kg  Weight history; 10/26/22 54.9 kg  09/25/22 54.6 kg  09/10/22 58.5 kg  07/04/22 52.7 kg  06/12/22 55.8 kg  05/19/22 56.2 kg  03/18/22  56.2 kg  03/03/22 52.4 kg  02/07/22 48.6 kg  01/06/22 51.8 kg    Average Meal Intake: NPO  Nutritionally Relevant Medications: Scheduled Meds:  sodium chloride   Intravenous Once   budesonide (PULMICORT) nebulizer solution  0.5 mg Nebulization BID   folic acid  1 mg Per Tube Daily   levothyroxine  25 mcg Per Tube QAC breakfast   pantoprazole (PROTONIX) IV  40 mg Intravenous Q12H    Continuous Infusions:  amiodarone 30 mg/hr (10/26/22 1002)   dextrose 5 % and 0.45 % NaCl 50 mL/hr at 10/26/22 1036   feeding supplement (OSMOLITE 1.5 CAL)     linezolid (ZYVOX) IV 600 mg (10/26/22 1041)   meropenem (MERREM) IV 1 g (10/26/22 1038)     Labs Reviewed: Reviewed CBG ranges from 93-217 mg/dL over the last 24 hours     NUTRITION - FOCUSED PHYSICAL EXAM:  Deferred  Diet Order:   Diet Order             Diet NPO time specified  Diet effective now                   EDUCATION NEEDS:   Not appropriate for education at this time  Skin:  Skin Assessment: Skin Integrity Issues: Skin Integrity Issues:: Stage II Stage II: L buttock Unstageable: L and R Back, L and R- tibial, ischial tuberosity, L arm and elbow, Mid back  Last BM:  10/5  Height:   Ht Readings from Last 1 Encounters:  10/25/22 5\' 10"  (1.778  m)    Weight:   Wt Readings from Last 1 Encounters:  10/26/22 54.9 kg    Ideal Body Weight:     BMI:  Body mass index is 17.37 kg/m.  Estimated Nutritional Needs:   Kcal:  5409-8119  Protein:  75-95 gram  Fluid:  33ml/kcal    Jamelle Haring RDN, LDN Clinical Dietitian  RDN pager # available on Amion

## 2022-10-26 NOTE — Progress Notes (Signed)
PHARMACY - PHYSICIAN COMMUNICATION CRITICAL VALUE ALERT - BLOOD CULTURE IDENTIFICATION (BCID)  Austin Davis is an 82 y.o. male who presented to [facility] from [facility] on 10/25/2022 with a chief complaint of hypotension  Assessment:   1/2 blood cultures growing MSSE, likely contaminant  Name of physician (or Provider) Contacted:  Dr. Delia Chimes  Current antibiotics:  Meropenem  Changes to prescribed antibiotics recommended:  No changes at this time  Results for orders placed or performed during the hospital encounter of 10/26/21  Blood Culture ID Panel (Reflexed) (Collected: 10/26/2021  3:33 PM)  Result Value Ref Range   Enterococcus faecalis NOT DETECTED NOT DETECTED   Enterococcus Faecium NOT DETECTED NOT DETECTED   Listeria monocytogenes NOT DETECTED NOT DETECTED   Staphylococcus species DETECTED (A) NOT DETECTED   Staphylococcus aureus (BCID) NOT DETECTED NOT DETECTED   Staphylococcus epidermidis NOT DETECTED NOT DETECTED   Staphylococcus lugdunensis NOT DETECTED NOT DETECTED   Streptococcus species NOT DETECTED NOT DETECTED   Streptococcus agalactiae NOT DETECTED NOT DETECTED   Streptococcus pneumoniae NOT DETECTED NOT DETECTED   Streptococcus pyogenes NOT DETECTED NOT DETECTED   A.calcoaceticus-baumannii NOT DETECTED NOT DETECTED   Bacteroides fragilis NOT DETECTED NOT DETECTED   Enterobacterales NOT DETECTED NOT DETECTED   Enterobacter cloacae complex NOT DETECTED NOT DETECTED   Escherichia coli NOT DETECTED NOT DETECTED   Klebsiella aerogenes NOT DETECTED NOT DETECTED   Klebsiella oxytoca NOT DETECTED NOT DETECTED   Klebsiella pneumoniae NOT DETECTED NOT DETECTED   Proteus species NOT DETECTED NOT DETECTED   Salmonella species NOT DETECTED NOT DETECTED   Serratia marcescens NOT DETECTED NOT DETECTED   Haemophilus influenzae NOT DETECTED NOT DETECTED   Neisseria meningitidis NOT DETECTED NOT DETECTED   Pseudomonas aeruginosa NOT DETECTED NOT DETECTED    Stenotrophomonas maltophilia NOT DETECTED NOT DETECTED   Candida albicans NOT DETECTED NOT DETECTED   Candida auris NOT DETECTED NOT DETECTED   Candida glabrata NOT DETECTED NOT DETECTED   Candida krusei NOT DETECTED NOT DETECTED   Candida parapsilosis NOT DETECTED NOT DETECTED   Candida tropicalis NOT DETECTED NOT DETECTED   Cryptococcus neoformans/gattii NOT DETECTED NOT DETECTED    Eddie Candle 10/26/2022  6:06 AM

## 2022-10-26 NOTE — Progress Notes (Addendum)
NAME:  Austin Davis, MRN:  [id number], DOB:  1940/04/04, LOS: 1 ADMISSION DATE:  10/25/2022, CONSULTATION DATE:  10/5 REFERRING MD:  Dr. Pilar Plate, CHIEF COMPLAINT:  Hypotension   History of Present Illness:  Patient is a 82 year old male with pertinent PMH chronic hypoxic respiratory failure trached on vent, HFrEF, COPD, PAF not on anticoagulation, CAD, hypothyroidism, MDRO infections presents from [facility] to [facility] ED on 10/5 with hypotension.  Patient recently admitted on 9/1 with malfunctioning PEG tube.  Also noted the patient's BP was borderline hypotensive but antibiotics were deferred at that time.  PEG was replaced by Dr. Dossie Der. GJ tube was also replaced by IR.   On arrival to [facility] ED, patient bp soft 97/56.  Afebrile and WBC 9.  LA 1.5.  UA with large leukocytes.  BC x 2 and UC sent and patient started on Vanco/Zosyn.  Patient placed on home vent settings.  ABG 7.38, 63, 56, 40.  CXR with chronic lung disease with interval filtrate at the left base.  Hgb 6.8.  Patient transfused 1 unit. PEG tube also appears to be disolodged. PCCM consulted for ICU admission.  Pertinent  Medical History   Past Medical History:  Diagnosis Date   Aspiration pneumonia (HCC) 03/25/2021   CHF (congestive heart failure) (HCC)    Coronary artery disease    Essential tremor    GERD (gastroesophageal reflux disease)    GI bleed 2019   hospitalized at [facility] for one week   Headache    since childhood   Hypothyroidism    Low blood sugar    since childhood, controlled by diet   Mitral regurgitation    Mitral valve prolapse    Osteopenia 2021   Paroxysmal atrial fibrillation (HCC)    Seizure-like activity (HCC)    Right gaze preference at baseline, essential termor at baseline, intermittent reduced responsiveness secondary to toxic/metabolic processes and poor cognitive reserve   Tremor      Significant Hospital Events: Including procedures, antibiotic start and stop dates in  addition to other pertinent events   10/5 soft bp; UA indicative of UTI; pccm consulted.  Hypotensive while in the ER, decision made to transfer to critical care service started on norepinephrine after volume replacement.  Right-sided PICC placed.  As he continued to be hypotensive meropenem and vancomycin were added, initially did not think he was infected 10/6 blood cultures positive for GPC, norepinephrine weaned off, on amiodarone drip.  Still awaiting PEG placement.  Hemoglobin with appropriate bump from 6.8-7.8 following transfusion today prior  Interim History / Subjective:   Looks about the same Objective   Blood pressure 107/72, pulse 70, temperature (!) 97.5 F (36.4 C), temperature source Oral, resp. rate 19, height 5\' 10"  (1.778 m), weight 54.9 kg, SpO2 100%.    Vent Mode: PCV FiO2 (%):  [40 %] 40 % Set Rate:  [26 bmp] 26 bmp PEEP:  [5 cmH20] 5 cmH20 Pressure Support:  [5 cmH20] 5 cmH20 Plateau Pressure:  [33 cmH20-35 cmH20] 34 cmH20   Intake/Output Summary (Last 24 hours) at 10/26/2022 0740 Last data filed at 10/26/2022 0600 Gross per 24 hour  Intake 2344.96 ml  Output 900 ml  Net 1444.96 ml   Filed Weights   10/25/22 0525 10/26/22 0206  Weight: 54.6 kg 54.9 kg    Examination: General Chronically ill-appearing 82 year old male patient currently on full ventilator support HEENT normocephalic atraumatic tracheostomy is midline no significant secretions Pulmonary: Coarse scattered rhonchi throughout no accessory use. 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 Hx of COPD -Most recent sputum culture  06/30/22 positive for multidrug-resistant Pseudomonas and Providencia, CXR w/ L>R airspace disease  Plan: Cont  home PCV settings, not a weaning candidate  Wean PEEP/FiO2 for SpO2 >92% VAP bundle in place See sepsis below re: abx  Trach care per protocol pulmicort and Xopenex as needed  PEG tube dislodgement Plan: IR replacement ordered Adding dextrose to IVF while NPO  Recurrent septic shock w/ GPC bacteremia  Plan Cont current abx (day 2 meropenem, changing vancomycin to Zyvox in case we are dealing with VRE Wean NE for SBP > 90 Add midodrine when able give VT ECHO (screening) TTE for endocarditis  PICC will need to come out  Isolated hyperglycemia blood chemistry Plan Trend CBG, if consistently greater than 140 will start sliding scale sensitive  Acute on chronic blood loss anemia Got 1 unit blood 10/5 Plan F/u cbc am Trigger for transfusion < 7 No systemic AC  Paroxysmal atrial fibrillation, not on anticoagulation outpatient CAD with chronic HFrEF Plan Cont amiodarone infusion for now, back to VT once PEG fixed Tele   Elevated alk phosph Plan Repeat LFT am  Hypothyroidism   Plan  Cont replacement  Ck t4 (TSH a little elevated)   Depression/anxiety Plan Cont zoloft  Severe protein calorie malnutrition Plan: Resume TFs when PEG replaced     Best Practice (right click and "Reselect all SmartList Selections" daily)   Best Practice (right click and "Reselect all SmartList Selections" daily)   Diet/type: tubefeeds (pending tube replacement)  DVT prophylaxis: SCD GI prophylaxis: PPI Lines: N/A Foley:  Yes, and it is still needed Code Status:  full code Last date of multidisciplinary goals of care discussion [pending]    Cct 33 min    Simonne Martinet ACNP-BC [facility] Pulmonary/Critical Care Pager # [phone] OR # [phone] if no answer

## 2022-10-27 ENCOUNTER — Inpatient Hospital Stay (HOSPITAL_COMMUNITY): Payer: Medicare Other

## 2022-10-27 DIAGNOSIS — Z430 Encounter for attention to tracheostomy: Secondary | ICD-10-CM | POA: Diagnosis not present

## 2022-10-27 DIAGNOSIS — J9611 Chronic respiratory failure with hypoxia: Secondary | ICD-10-CM

## 2022-10-27 DIAGNOSIS — J810 Acute pulmonary edema: Secondary | ICD-10-CM

## 2022-10-27 DIAGNOSIS — R7881 Bacteremia: Secondary | ICD-10-CM

## 2022-10-27 DIAGNOSIS — B958 Unspecified staphylococcus as the cause of diseases classified elsewhere: Secondary | ICD-10-CM

## 2022-10-27 DIAGNOSIS — J9621 Acute and chronic respiratory failure with hypoxia: Secondary | ICD-10-CM | POA: Diagnosis not present

## 2022-10-27 DIAGNOSIS — Z7189 Other specified counseling: Secondary | ICD-10-CM

## 2022-10-27 DIAGNOSIS — J9622 Acute and chronic respiratory failure with hypercapnia: Secondary | ICD-10-CM

## 2022-10-27 DIAGNOSIS — I38 Endocarditis, valve unspecified: Secondary | ICD-10-CM

## 2022-10-27 DIAGNOSIS — N390 Urinary tract infection, site not specified: Secondary | ICD-10-CM

## 2022-10-27 DIAGNOSIS — Z452 Encounter for adjustment and management of vascular access device: Secondary | ICD-10-CM | POA: Diagnosis not present

## 2022-10-27 DIAGNOSIS — Z9911 Dependence on respirator [ventilator] status: Secondary | ICD-10-CM | POA: Diagnosis not present

## 2022-10-27 LAB — GLUCOSE, CAPILLARY
Glucose-Capillary: 103 mg/dL — ABNORMAL HIGH (ref 70–99)
Glucose-Capillary: 109 mg/dL — ABNORMAL HIGH (ref 70–99)
Glucose-Capillary: 121 mg/dL — ABNORMAL HIGH (ref 70–99)
Glucose-Capillary: 125 mg/dL — ABNORMAL HIGH (ref 70–99)
Glucose-Capillary: 125 mg/dL — ABNORMAL HIGH (ref 70–99)
Glucose-Capillary: 162 mg/dL — ABNORMAL HIGH (ref 70–99)

## 2022-10-27 LAB — CULTURE, BLOOD (ROUTINE X 2): Special Requests: ADEQUATE

## 2022-10-27 LAB — CBC
HCT: 27.1 % — ABNORMAL LOW (ref 39.0–52.0)
Hemoglobin: 8.3 g/dL — ABNORMAL LOW (ref 13.0–17.0)
MCH: 28.9 pg (ref 26.0–34.0)
MCHC: 30.6 g/dL (ref 30.0–36.0)
MCV: 94.4 fL (ref 80.0–100.0)
Platelets: 212 10*3/uL (ref 150–400)
RBC: 2.87 MIL/uL — ABNORMAL LOW (ref 4.22–5.81)
RDW: 20 % — ABNORMAL HIGH (ref 11.5–15.5)
WBC: 10.5 10*3/uL (ref 4.0–10.5)
nRBC: 0 % (ref 0.0–0.2)

## 2022-10-27 LAB — ECHOCARDIOGRAM COMPLETE
AR max vel: 2.89 cm2
AV Peak grad: 5.1 mm[Hg]
Ao pk vel: 1.13 m/s
Area-P 1/2: 3.65 cm2
Height: 70 in
MV M vel: 1.93 m/s
MV Peak grad: 14.9 mm[Hg]
MV VTI: 1.5 cm2
P 1/2 time: 417 ms
S' Lateral: 3.15 cm
Weight: 2116.42 [oz_av]

## 2022-10-27 LAB — COMPREHENSIVE METABOLIC PANEL
ALT: 15 U/L (ref 0–44)
AST: 12 U/L — ABNORMAL LOW (ref 15–41)
Albumin: <1.5 g/dL — ABNORMAL LOW (ref 3.5–5.0)
Alkaline Phosphatase: 152 U/L — ABNORMAL HIGH (ref 38–126)
Anion gap: 11 (ref 5–15)
BUN: 28 mg/dL — ABNORMAL HIGH (ref 8–23)
CO2: 29 mmol/L (ref 22–32)
Calcium: 7.4 mg/dL — ABNORMAL LOW (ref 8.9–10.3)
Chloride: 98 mmol/L (ref 98–111)
Creatinine, Ser: 0.44 mg/dL — ABNORMAL LOW (ref 0.61–1.24)
GFR, Estimated: >60 mL/min (ref >60)
Glucose, Bld: 195 mg/dL — ABNORMAL HIGH (ref 70–99)
Potassium: 3.4 mmol/L — ABNORMAL LOW (ref 3.5–5.1)
Sodium: 138 mmol/L (ref 135–145)
Total Bilirubin: 0.2 mg/dL — ABNORMAL LOW (ref 0.3–1.2)
Total Protein: 5.6 g/dL — ABNORMAL LOW (ref 6.5–8.1)

## 2022-10-27 LAB — PROCALCITONIN: Procalcitonin: 0.1 ng/mL

## 2022-10-27 MED ORDER — PERFLUTREN LIPID MICROSPHERE
1.0000 mL | INTRAVENOUS | Status: AC | PRN
  Administered 2022-10-27: 4 mL via INTRAVENOUS

## 2022-10-27 MED ORDER — LACTATED RINGERS IV BOLUS
1000.0000 mL | Freq: Once | INTRAVENOUS | Status: AC
  Administered 2022-10-27: 1000 mL via INTRAVENOUS

## 2022-10-27 MED ORDER — FUROSEMIDE 10 MG/ML IJ SOLN
60.0000 mg | Freq: Once | INTRAMUSCULAR | Status: AC
  Administered 2022-10-27: 60 mg via INTRAVENOUS
  Filled 2022-10-27: qty 6

## 2022-10-27 MED ORDER — SODIUM CHLORIDE 0.9 % IV SOLN
1.0000 mg | Freq: Once | INTRAVENOUS | Status: AC
  Administered 2022-10-27: 1 mg via INTRAVENOUS
  Filled 2022-10-27: qty 0.2

## 2022-10-27 MED ORDER — POTASSIUM CHLORIDE 10 MEQ/50ML IV SOLN
10.0000 meq | INTRAVENOUS | Status: AC
  Administered 2022-10-27 (×4): 10 meq via INTRAVENOUS
  Filled 2022-10-27 (×4): qty 50

## 2022-10-27 MED ORDER — ALBUMIN HUMAN 5 % IV SOLN
25.0000 g | Freq: Once | INTRAVENOUS | Status: AC
  Administered 2022-10-27: 25 g via INTRAVENOUS
  Filled 2022-10-27: qty 500

## 2022-10-27 MED ORDER — FUROSEMIDE 10 MG/ML IJ SOLN
40.0000 mg | Freq: Once | INTRAMUSCULAR | Status: AC
  Administered 2022-10-27: 40 mg via INTRAVENOUS
  Filled 2022-10-27: qty 4

## 2022-10-27 MED ORDER — MEDIHONEY WOUND/BURN DRESSING EX PSTE
1.0000 | PASTE | Freq: Every day | CUTANEOUS | Status: DC
  Administered 2022-10-27 – 2022-10-28 (×2): 1 via TOPICAL
  Filled 2022-10-27: qty 44

## 2022-10-27 NOTE — Progress Notes (Signed)
NAME:  Austin Davis, MRN:  [id number], DOB:  [birth year], LOS: 2 ADMISSION DATE:  10/25/2022, CONSULTATION DATE:  10/5 REFERRING MD:  Dr. Pilar Plate, CHIEF COMPLAINT:  Hypotension   History of Present Illness:  Patient is a 82 year old male with pertinent PMH chronic hypoxic respiratory failure trached on vent, HFrEF, COPD, PAF not on anticoagulation, CAD, hypothyroidism, MDRO infections presents from [facility] to [facility] ED on 10/5 with hypotension.  Patient recently admitted on 9/1 with malfunctioning PEG tube.  Also noted the patient's BP was borderline hypotensive but antibiotics were deferred at that time.  PEG was replaced by Dr. Dossie Der. GJ tube was also replaced by IR.   On arrival to [facility] ED, patient bp soft 97/56.  Afebrile and WBC 9.  LA 1.5.  UA with large leukocytes.  BC x 2 and UC sent and patient started on Vanco/Zosyn.  Patient placed on home vent settings.  ABG 7.38, 63, 56, 40.  CXR with chronic lung disease with interval filtrate at the left base.  Hgb 6.8.  Patient transfused 1 unit. PEG tube also appears to be disolodged. PCCM consulted for ICU admission.  Pertinent  Medical History   Past Medical History:  Diagnosis Date   Aspiration pneumonia (HCC) 03/25/2021   CHF (congestive heart failure) (HCC)    Coronary artery disease    Essential tremor    GERD (gastroesophageal reflux disease)    GI bleed 2019   hospitalized at [facility] for one week   Headache    since childhood   Hypothyroidism    Low blood sugar    since childhood, controlled by diet   Mitral regurgitation    Mitral valve prolapse    Osteopenia 2021   Paroxysmal atrial fibrillation (HCC)    Seizure-like activity (HCC)    Right gaze preference at baseline, essential termor at baseline, intermittent reduced responsiveness secondary to toxic/metabolic processes and poor cognitive reserve   Tremor      Significant Hospital Events: Including procedures, antibiotic start and stop dates in  addition to other pertinent events   10/5 soft bp; UA indicative of UTI; pccm consulted.  Hypotensive while in the ER, decision made to transfer to critical care service started on norepinephrine after volume replacement.  Right-sided PICC placed.  As he continued to be hypotensive meropenem and vancomycin were added, initially did not think he was infected 10/6 blood cultures positive for GPC, norepinephrine weaned off, on amiodarone drip.  Still awaiting PEG placement.  Hemoglobin with appropriate bump from 6.8-7.8 following transfusion today prior. Mero and zyvox  10/7 awaiting PEG replacement   Interim History / Subjective:   Off pressors  Getting echo   Objective   Blood pressure 98/79, pulse 95, temperature (!) 96.3 F (35.7 C), temperature source Axillary, resp. rate (!) 24, height 5\' 10"  (1.778 m), weight 60 kg, SpO2 100%.    Vent Mode: PCV FiO2 (%):  [40 %] 40 % Set Rate:  [26 bmp] 26 bmp PEEP:  [5 cmH20] 5 cmH20 Plateau Pressure:  [32 cmH20-36 cmH20] 36 cmH20   Intake/Output Summary (Last 24 hours) at 10/27/2022 1125 Last data filed at 10/27/2022 1100 Gross per 24 hour  Intake 2664.4 ml  Output 1390 ml  Net 1274.4 ml   Filed Weights   10/25/22 0525 10/26/22 0206 10/27/22 0237  Weight: 54.6 kg 54.9 kg 60 kg    Examination: Gen- chronically critically ill appearing elderly M Neuro: following commands. Facial tremor  HEENT: temporal muscle wasting Trach secure  Thick yellow trach secretions  Pulm: rhonchi. Mechanically ventilated  CV: irreg rhythm.  cap refill is less than 3 sec  GI: Thin  MSK: BLE edema. No acute joint deformity Skin: pale, scattered ecchymosis and foam dressings on extremities.    Resolved Hospital Problem list     Assessment & Plan:   Chronic hypoxic and hypercarbic resp failure VDRF Trach dependence Hx recurrent HCAP Hx MDRO  Hx COPD  -Most recent sputum culture 06/30/22 positive for multidrug-resistant Pseudomonas and  Providencia P -cont full support, on PCV at baseline which we will continue. Doesn't wean  -VAP, pulm hygiene -mero zyvox  -pulmicort, PRN xopenex  -lasix  PEG dislodgement  Plan: -IR replacement is ordered   Septic shock w staph bacteremia, GNR UTI, probable PNA - improved shock  P -f/u echo -zyvox, mero -PICC placed 10/5 -- realize that w bacteremia ideally we would have a line holiday but we can't get alt access so will maintain this   AoC blood loss Anemia  Got 1 unit blood 10/5 P -not on systemic AC -PRN CBC  pAF not on chronic AC CAD Chronic HFreF -amio gtt for now, need PEG replacement for per tube meds   Elevated LFTs , mild  -PRN LFTs   Hypothyroidism   -synthroid per tube when able, if this isnt replaced by 10/8 can start IV   Depression/anxiety Plan -zoloft per tube when able   Severe protein calorie malnutrition Hypokalemia  Plan: -EN via PEG when replaced  -Cont d5 in interim  -Follow BMP   Pressure wounds POA -WOC   GOC discussion DNR discussion -remains full code full scope of offered care at present. Talked to him about my concerns & recommend he revisit minimally code status discussions with his family   Best Practice (right click and "Reselect all SmartList Selections" daily)   Best Practice (right click and "Reselect all SmartList Selections" daily)   Diet/type: tubefeeds (pending tube replacement)  DVT prophylaxis: SCD GI prophylaxis: PPI Lines: N/A Foley:  Yes, and it is still needed Code Status:  full code Last date of multidisciplinary goals of care discussion [pending]  CRITICAL CARE Performed by: Lanier Clam   Total critical care time: 48 minutes  Critical care time was exclusive of separately billable procedures and treating other patients. Critical care was necessary to treat or prevent imminent or life-threatening deterioration.  Critical care was time spent personally by me on the following activities:  development of treatment plan with patient and/or surrogate as well as nursing, discussions with consultants, evaluation of patient's response to treatment, examination of patient, obtaining history from patient or surrogate, ordering and performing treatments and interventions, ordering and review of laboratory studies, ordering and review of radiographic studies, pulse oximetry and re-evaluation of patient's condition.  Tessie Fass MSN, AGACNP-BC [facility] Pulmonary/Critical Care Medicine Amion for pager  10/27/2022, 11:25 AM

## 2022-10-27 NOTE — Progress Notes (Signed)
[facility] ADULT ICU REPLACEMENT PROTOCOL   The patient does apply for the [facility] Adult ICU Electrolyte Replacment Protocol based on the criteria listed below:   1.Exclusion criteria: TCTS, ECMO, Dialysis, and Myasthenia Gravis patients 2. Is GFR >/= 30 ml/min? Yes.    Patient's GFR today is >60 3. Is SCr </= 2? Yes.   Patient's SCr is 0.44 mg/dL 4. Did SCr increase >/= 0.5 in 24 hours? No. 5.Pt's weight >40kg  Yes.   6. Abnormal electrolyte(s): K+3.4  7. Electrolytes replaced per protocol 8.  Call MD STAT for K+ </= 2.5, Phos </= 1, or Mag </= 1 Physician:  Dr Vladimir Faster  Lolita Lenz 10/27/2022 5:35 AM

## 2022-10-27 NOTE — Consult Note (Signed)
WOC Nurse Consult Note: Reason for Consult: multiple unstageable  Patient trach dependent from LTAC; last seen 9.5.24 by WOC nursing at that time only consulted for sacral wound and Gtube. No other PI noted at that time  Wound type: Right mid back; Stage 3 Pressure Injury: full thickness; dark at edges, pink Left mid back: Stage 2 Pressure Injury; 100% pink L mid back: Unstageable Pressure Injury: 90% yellow/10% pink along perimeter Sacrum: Unstageable Pressure Injury: 50% yellow/50% pink Right ischial tuberosity: Stage 3 Pressure Injury: 100% dark ruddy  Left ischial tuberosity: Unstageable Pressure Injury; 100% dark brown/not black at this time Left elbow: Unstageable Pressure Injury: 100% yellow/white Left Forearm; full thickness; eschar with surrounding ecchymosis; unclear etiology; not a pressure point Left lateral leg: large linear Unstageable Pressure Injury: unclear due to presentation on the lateral calf; 100% black Right leg posterior calf; 100% black; again unclear etiology; linear, no surgical history noted on either site.  Pressure Injury POA: Yes Measurement:see nursing flow sheets at the time of admission Wound bed:see above  Drainage (amount, consistency, odor) see nursing flow sheets Periwound: intact; ecchymosis  Dressing procedure/placement/frequency: Low air loss mattress in place for moisture management and pressure redistribution while in the ICU; will need air mattress if patient transfers from ICU Silicone foam to the left mid buttock; change every 3 days and PRN soilage Hydrogel to the right mid back, right ischial tuberosity, left elbow, top with dry dressing  Apply 1/4" thick layer of leptospermum honey to right and left lower extremity wounds, sacral wound, left mid back wound, left ischial tuberosity, left forearm wound beds, top with dry dressing. Change daily.    Re consult if needed, will not follow at this time. Thanks  Leniya Breit [employer], RN,CWOCN, CNS,  CWON-AP [phone])

## 2022-10-27 NOTE — Progress Notes (Signed)
Tried to reach his POA, no answer   Tessie Fass MSN, AGACNP-BC [facility] Pulmonary/Critical Care Medicine 10/27/2022, 1:51 PM

## 2022-10-27 NOTE — Progress Notes (Signed)
Echocardiogram 2D Echocardiogram has been performed.  Austin Davis 10/27/2022, 9:17 AM

## 2022-10-27 NOTE — Progress Notes (Addendum)
eLink Physician-Brief Progress Note Patient Name: SMITH POTENZA DOB: November 11, 1940 MRN: [id number]   Date of Service  10/27/2022  HPI/Events of Note  Notified of calcium 7.4,  corrects to 9.5 when accounting for albumin.   eICU Interventions  No calcium repletion ordered.  Continue enteral tube feeds.         Phillip Sandler M DELA CRUZ 10/27/2022, 5:45 AM

## 2022-10-28 ENCOUNTER — Inpatient Hospital Stay (HOSPITAL_COMMUNITY): Payer: Medicare Other

## 2022-10-28 DIAGNOSIS — E43 Unspecified severe protein-calorie malnutrition: Secondary | ICD-10-CM

## 2022-10-28 DIAGNOSIS — I482 Chronic atrial fibrillation, unspecified: Secondary | ICD-10-CM

## 2022-10-28 DIAGNOSIS — K9429 Other complications of gastrostomy: Secondary | ICD-10-CM | POA: Diagnosis not present

## 2022-10-28 HISTORY — PX: IR REPLC GASTRO/COLONIC TUBE PERCUT W/FLUORO: IMG2333

## 2022-10-28 LAB — CBC
HCT: 24.6 % — ABNORMAL LOW (ref 39.0–52.0)
Hemoglobin: 7.5 g/dL — ABNORMAL LOW (ref 13.0–17.0)
MCH: 28.6 pg (ref 26.0–34.0)
MCHC: 30.5 g/dL (ref 30.0–36.0)
MCV: 93.9 fL (ref 80.0–100.0)
Platelets: 177 10*3/uL (ref 150–400)
RBC: 2.62 MIL/uL — ABNORMAL LOW (ref 4.22–5.81)
RDW: 19.6 % — ABNORMAL HIGH (ref 11.5–15.5)
WBC: 9.1 10*3/uL (ref 4.0–10.5)
nRBC: 0 % (ref 0.0–0.2)

## 2022-10-28 LAB — MAGNESIUM: Magnesium: 1.6 mg/dL — ABNORMAL LOW (ref 1.7–2.4)

## 2022-10-28 LAB — BASIC METABOLIC PANEL
Anion gap: 11 (ref 5–15)
BUN: 21 mg/dL (ref 8–23)
CO2: 29 mmol/L (ref 22–32)
Calcium: 7.3 mg/dL — ABNORMAL LOW (ref 8.9–10.3)
Chloride: 96 mmol/L — ABNORMAL LOW (ref 98–111)
Creatinine, Ser: 0.42 mg/dL — ABNORMAL LOW (ref 0.61–1.24)
GFR, Estimated: >60 mL/min (ref >60)
Glucose, Bld: 148 mg/dL — ABNORMAL HIGH (ref 70–99)
Potassium: 2.9 mmol/L — ABNORMAL LOW (ref 3.5–5.1)
Sodium: 136 mmol/L (ref 135–145)

## 2022-10-28 LAB — GLUCOSE, CAPILLARY
Glucose-Capillary: 99 mg/dL (ref 70–99)
Glucose-Capillary: 90 mg/dL (ref 70–99)
Glucose-Capillary: 84 mg/dL (ref 70–99)
Glucose-Capillary: 71 mg/dL (ref 70–99)
Glucose-Capillary: 76 mg/dL (ref 70–99)

## 2022-10-28 LAB — PROCALCITONIN: Procalcitonin: 0.2 ng/mL

## 2022-10-28 MED ORDER — MAGNESIUM SULFATE 4 GM/100ML IV SOLN
4.0000 g | Freq: Once | INTRAVENOUS | Status: AC
  Administered 2022-10-28: 4 g via INTRAVENOUS
  Filled 2022-10-28: qty 100

## 2022-10-28 MED ORDER — ADULT MULTIVITAMIN W/MINERALS CH
1.0000 | ORAL_TABLET | Freq: Every day | ORAL | Status: DC
  Administered 2022-10-28 – 2022-10-29 (×2): 1
  Filled 2022-10-28 (×2): qty 1

## 2022-10-28 MED ORDER — AMIODARONE HCL 200 MG PO TABS
200.0000 mg | ORAL_TABLET | Freq: Every day | ORAL | Status: DC
  Administered 2022-10-29: 200 mg
  Filled 2022-10-28: qty 1

## 2022-10-28 MED ORDER — OSMOLITE 1.5 CAL PO LIQD
1000.0000 mL | ORAL | Status: DC
  Administered 2022-10-28: 1000 mL

## 2022-10-28 MED ORDER — SODIUM CHLORIDE 0.9 % IV SOLN
1.0000 g | Freq: Three times a day (TID) | INTRAVENOUS | Status: DC
  Administered 2022-10-28 – 2022-10-29 (×2): 1 g via INTRAVENOUS
  Filled 2022-10-28 (×2): qty 20

## 2022-10-28 MED ORDER — IOHEXOL 300 MG/ML  SOLN
50.0000 mL | Freq: Once | INTRAMUSCULAR | Status: AC | PRN
  Administered 2022-10-28: 15 mL

## 2022-10-28 MED ORDER — FUROSEMIDE 10 MG/ML IJ SOLN
40.0000 mg | Freq: Every day | INTRAMUSCULAR | Status: DC
  Administered 2022-10-28 – 2022-10-29 (×2): 40 mg via INTRAVENOUS
  Filled 2022-10-28 (×2): qty 4

## 2022-10-28 MED ORDER — GABAPENTIN 250 MG/5ML PO SOLN
200.0000 mg | Freq: Two times a day (BID) | ORAL | Status: DC
  Administered 2022-10-28 – 2022-10-29 (×2): 200 mg
  Filled 2022-10-28 (×3): qty 4

## 2022-10-28 MED ORDER — ALBUMIN HUMAN 5 % IV SOLN
25.0000 g | Freq: Once | INTRAVENOUS | Status: AC
  Administered 2022-10-28: 25 g via INTRAVENOUS
  Filled 2022-10-28: qty 500

## 2022-10-28 MED ORDER — MIDODRINE HCL 5 MG PO TABS
10.0000 mg | ORAL_TABLET | Freq: Three times a day (TID) | ORAL | Status: DC
  Administered 2022-10-28 – 2022-10-29 (×2): 10 mg
  Filled 2022-10-28 (×2): qty 2

## 2022-10-28 MED ORDER — POTASSIUM CHLORIDE 10 MEQ/50ML IV SOLN
10.0000 meq | INTRAVENOUS | Status: AC
  Administered 2022-10-28 (×6): 10 meq via INTRAVENOUS
  Filled 2022-10-28 (×6): qty 50

## 2022-10-28 MED ORDER — JUVEN PO PACK
1.0000 | PACK | Freq: Two times a day (BID) | ORAL | Status: DC
  Administered 2022-10-29 (×2): 1
  Filled 2022-10-28 (×2): qty 1

## 2022-10-28 MED ORDER — MORPHINE SULFATE (PF) 2 MG/ML IV SOLN
4.0000 mg | INTRAVENOUS | Status: DC | PRN
  Administered 2022-10-28: 4 mg via INTRAVENOUS
  Filled 2022-10-28: qty 2

## 2022-10-28 MED ORDER — LIDOCAINE VISCOUS HCL 2 % MT SOLN
OROMUCOSAL | Status: AC
  Filled 2022-10-28: qty 15

## 2022-10-28 MED ORDER — POTASSIUM CHLORIDE 10 MEQ/50ML IV SOLN
10.0000 meq | INTRAVENOUS | Status: AC
  Administered 2022-10-28 (×2): 10 meq via INTRAVENOUS
  Filled 2022-10-28: qty 50

## 2022-10-28 NOTE — Progress Notes (Signed)
Pt transported to and from IR on the ventilator without incident. 

## 2022-10-28 NOTE — Progress Notes (Signed)
Patient returned to unit from GJ tube reinsertion.   Patient c/o sacral pain.  Repositioned and MD notified for pain medication.

## 2022-10-28 NOTE — Progress Notes (Signed)
NAME:  Austin Davis, MRN:  [id number], DOB:  1940/03/23, LOS: 3 ADMISSION DATE:  10/25/2022, CONSULTATION DATE:  10/5 REFERRING MD:  Dr. Pilar Plate, CHIEF COMPLAINT:  Hypotension   History of Present Illness:  Patient is a 82 year old male with pertinent PMH chronic hypoxic respiratory failure trached on vent, HFrEF, COPD, PAF not on anticoagulation, CAD, hypothyroidism, MDRO infections presents from [facility] to [facility] ED on 10/5 with hypotension.  Patient recently admitted on 9/1 with malfunctioning PEG tube.  Also noted the patient's BP was borderline hypotensive but antibiotics were deferred at that time.  PEG was replaced by Dr. Dossie Der. GJ tube was also replaced by IR.   On arrival to [facility] ED, patient bp soft 97/56.  Afebrile and WBC 9.  LA 1.5.  UA with large leukocytes.  BC x 2 and UC sent and patient started on Vanco/Zosyn.  Patient placed on home vent settings.  ABG 7.38, 63, 56, 40.  CXR with chronic lung disease with interval filtrate at the left base.  Hgb 6.8.  Patient transfused 1 unit. PEG tube also appears to be disolodged. PCCM consulted for ICU admission.  Pertinent  Medical History   Past Medical History:  Diagnosis Date   Aspiration pneumonia (HCC) 03/25/2021   CHF (congestive heart failure) (HCC)    Coronary artery disease    Essential tremor    GERD (gastroesophageal reflux disease)    GI bleed 2019   hospitalized at [facility] for one week   Headache    since childhood   Hypothyroidism    Low blood sugar    since childhood, controlled by diet   Mitral regurgitation    Mitral valve prolapse    Osteopenia 2021   Paroxysmal atrial fibrillation (HCC)    Seizure-like activity (HCC)    Right gaze preference at baseline, essential termor at baseline, intermittent reduced responsiveness secondary to toxic/metabolic processes and poor cognitive reserve   Tremor      Significant Hospital Events: Including procedures, antibiotic start and stop dates in  addition to other pertinent events   10/5 soft bp; UA indicative of UTI; pccm consulted.  Hypotensive while in the ER, decision made to transfer to critical care service started on norepinephrine after volume replacement.  Right-sided PICC placed.  As he continued to be hypotensive meropenem and vancomycin were added, initially did not think he was infected 10/6 blood cultures positive for GPC, norepinephrine weaned off, on amiodarone drip.  Still awaiting PEG placement.  Hemoglobin with appropriate bump from 6.8-7.8 following transfusion today prior. Mero and zyvox  10/7 awaiting PEG replacement  10/8 dc zyvox w bcx showing staph capitis   Interim History / Subjective:   NAEO  Still awaiting PEG   Objective   Blood pressure (!) 84/55, pulse 75, temperature 98 F (36.7 C), temperature source Oral, resp. rate (!) 26, height 5\' 10"  (1.778 m), weight 68.6 kg, SpO2 97%.    Vent Mode: PCV FiO2 (%):  [40 %-60 %] 40 % Set Rate:  [26 bmp] 26 bmp PEEP:  [5 cmH20] 5 cmH20 Pressure Support:  [32 cmH20] 32 cmH20 Plateau Pressure:  [24 cmH20-37 cmH20] 24 cmH20   Intake/Output Summary (Last 24 hours) at 10/28/2022 0948 Last data filed at 10/28/2022 0800 Gross per 24 hour  Intake 2221.54 ml  Output 2210 ml  Net 11.54 ml   Filed Weights   10/26/22 0206 10/27/22 0237 10/28/22 0500  Weight: 54.9 kg 60 kg 68.6 kg    Examination: Gen- chronically  critically ill elderly M  Neuro: \facial tremor. Following commands. Generalized wknss  HEENT: Temporal muscle wasting. Trach secure  Pulm: mechanically ventilated, symmetrical chest expansion   CV: irreg rhythm, reg rate  GI: thin  GU: edematous. Foley  MSK: extremity edema no acute joint deformity  Skin: pale. Scattered ecchymosis    Resolved Hospital Problem list     Assessment & Plan:    Chronic resp failure w hypoxia and hypercarbia VDRF Trach dependence Recurrent MDRO HCAP Hx COPD -Most recent sputum culture 06/30/22 positive for  multidrug-resistant Pseudomonas and Providencia P -cont MV support-- is on baseline PCV settings and does not wean  -VAP pulm hygiene  -mero -pulmicort, PRN xopenex   Malpositioned PEG  Plan: -IR replacement is ordered -- will fu about this again   Sepsis  --  polymicrobial UTI, likely HCAP. Bcx w 1x staph capitis which is more likely contaminant P -with suspected contaminant, dc zyvox. Cont mero -follow repeat cx   pAF not on chronic AC CAD AoC Chronic HFrEF with G2dd and moderate RV dysfxn -ECHO 10/7 w EF decr to 35% g2dd, usual RV dysfunction  P -amio gtt for now, need PEG replacement for per tube meds  -lasix  -no systemic AC   AoC anemia -chronic blood loss, chronic dz  Got 1 unit blood 10/5 P -not on systemic AC, SCDs for ppx  -PRN CBC  Elevated LFTs  -PRN LFTs   Hypothyroidism   -synthroid per tube when able, not yet in a window where IV needs to be started in lieu of enteral admin   Depression/anxiety Plan -zoloft per tube when able   Severe protein calorie malnutrition Hypokalemia Hypomagnesemia  Plan: -EN via PEG when able  -IV mag and K -PRN dextrose   Pressure wounds POA -WOC   Chronic foley  -with degree of GU swelling would need uro to replace   GOC discussion DNR discussion -remains full code full scope of offered care at present. Talked to him about my concerns & recommend he revisit minimally code status discussions with his family   Best Practice (right click and "Reselect all SmartList Selections" daily)   Best Practice (right click and "Reselect all SmartList Selections" daily)   Diet/type: tubefeeds (pending tube replacement)  DVT prophylaxis: SCD GI prophylaxis: PPI Lines: N/A Foley:  Yes, and it is still needed Code Status:  full code Last date of multidisciplinary goals of care discussion [pending]  CRITICAL CARE Performed by: Lanier Clam   Total critical care time: 36 minutes  Critical care time was exclusive of  separately billable procedures and treating other patients. Critical care was necessary to treat or prevent imminent or life-threatening deterioration.  Critical care was time spent personally by me on the following activities: development of treatment plan with patient and/or surrogate as well as nursing, discussions with consultants, evaluation of patient's response to treatment, examination of patient, obtaining history from patient or surrogate, ordering and performing treatments and interventions, ordering and review of laboratory studies, ordering and review of radiographic studies, pulse oximetry and re-evaluation of patient's condition.  Tessie Fass MSN, AGACNP-BC [facility] Pulmonary/Critical Care Medicine Amion for pager  10/28/2022, 9:48 AM

## 2022-10-28 NOTE — Procedures (Signed)
Interventional Radiology Procedure Note  Procedure: Re-insertion of GJ tube  Indication: Dislodged GJ tube  Findings: Please refer to procedural dictation for full description.  Complications: None  EBL: < 10 mL  Acquanetta Belling, MD [phone]

## 2022-10-28 NOTE — Progress Notes (Signed)
Heart Failure Navigator Progress Note  Assessed for Heart & Vascular TOC clinic readiness.  Patient does not meet criteria due from [facility] , trach and ventilator dependent. No HF TOC .  Navigator will sign off at this time.   Rhae Hammock, BSN, Scientist, clinical (histocompatibility and immunogenetics) Only

## 2022-10-28 NOTE — Progress Notes (Signed)
Nutrition Follow-up  DOCUMENTATION CODES:   Severe malnutrition in context of chronic illness  INTERVENTION:   When enteral access is obtained, recommend initiation of enteral nutrition: - Start Osmolite 1.5 @ 20 ml/hr and advance rate by 10 ml every 8 hours to goal rate of 60 ml/hr (1440 ml/day)  Tube feeding regimen at goal rate would provide 2160 kcal, 90 grams of protein, and 1097 ml of H2O.   - Recommend 1 packet Juven BID per tube to support wound healing, each packet provides 95 calories, 2.5 grams of protein, and 9.8 grams of carbohydrate   - Recommend MVI with minerals daily per tube  NUTRITION DIAGNOSIS:   Severe Malnutrition related to chronic illness (COPD, chronic respiratory failure, HFrEF) as evidenced by severe fat depletion, severe muscle depletion.  New diagnosis after completion of NFPE  GOAL:   Patient will meet greater than or equal to 90% of their needs  Unmet at this time  MONITOR:   Labs, Weight trends, Skin, I & O's  REASON FOR ASSESSMENT:   Consult Enteral/tube feeding initiation and management  ASSESSMENT:   82 year old male with chronic hypoxic respiratory failure, ventilator dependent, CHF, CAD, GERD, Admitted with sepsis with septic shock due to staph bacteremia, A-fib with RVR and dislodgment of PEG tube  Discussed pt with RN and during ICU rounds. Discussed pt with PCCM NP. Pt's G-J is malpositioned at this time; waiting on IR replacement.  Pt with deep pitting generalized edema, deep pitting edema to BUE, and deep pitting edema to BLE. Pt with significant weight gain this admission; suspect majority of weight gain is related to fluid given deep pitting edema. Suspect dry weight is closer to admit weight of 54.6 kg.  RD will leave tube feeding recommendations for use once appropriate enteral access is obtained.  Admit weight: 54.6 kg Current weight: 68.6 kg  Patient is on chronic ventilator support via trach Temp (24hrs), Avg:96.4 F  (35.8 C), Min:94.2 F (34.6 C), Max:98.3 F (36.8 C)  Drips: Amiodarone  Medications reviewed and include: IV lasix 40 mg daily, IV protonix, IV magnesium sulfate 4 grams x 1, IV abx, IV KCl 10 mEq x 8  Labs reviewed: potassium 2.9, chloride 96, magnesium 1.6, hemoglobin 7.5  UOP: 2000 ml x 24 hours I/O's: +3.0 L since admit  NUTRITION - FOCUSED PHYSICAL EXAM:  Flowsheet Row Most Recent Value  Orbital Region Severe depletion  Upper Arm Region Unable to assess  [edema]  Thoracic and Lumbar Region Moderate depletion  Buccal Region Severe depletion  Temple Region Severe depletion  Clavicle Bone Region Severe depletion  Clavicle and Acromion Bone Region Severe depletion  Scapular Bone Region Severe depletion  Dorsal Hand Unable to assess  [edema]  Patellar Region Severe depletion  Anterior Thigh Region Severe depletion  Posterior Calf Region Unable to assess  [edema]  Edema (RD Assessment) Moderate  Hair Reviewed  Eyes Reviewed  Mouth Reviewed  Skin Reviewed  Nails Reviewed    Diet Order:   Diet Order             Diet NPO time specified  Diet effective now                   EDUCATION NEEDS:   Not appropriate for education at this time  Skin:  Skin Assessment: Skin Integrity Issues: Stage II: L mid back Stage III: R mid back, R ischial tuberosity Unstageable: L mid back, sacrum, L ischial tuberosity, L elbow, L lateral leg Other: full  thickness wounds to L forearm and R leg posterior calf  Last BM:  10/25/22  Height:   Ht Readings from Last 1 Encounters:  10/27/22 5\' 10"  (1.778 m)    Weight:   Wt Readings from Last 1 Encounters:  10/28/22 68.6 kg    BMI:  Body mass index is 21.7 kg/m.  Estimated Nutritional Needs:   Kcal:  1800-2000  Protein:  85-100 grams  Fluid:  1.8-2.0 L    Mertie Clause, MS, RD, LDN Registered Dietitian II Please see AMiON for contact information.

## 2022-10-29 LAB — RENAL FUNCTION PANEL
Albumin: 1.9 g/dL — ABNORMAL LOW (ref 3.5–5.0)
Anion gap: 8 (ref 5–15)
BUN: 19 mg/dL (ref 8–23)
CO2: 30 mmol/L (ref 22–32)
Calcium: 7.5 mg/dL — ABNORMAL LOW (ref 8.9–10.3)
Chloride: 99 mmol/L (ref 98–111)
Creatinine, Ser: 0.44 mg/dL — ABNORMAL LOW (ref 0.61–1.24)
GFR, Estimated: >60 mL/min (ref >60)
Glucose, Bld: 122 mg/dL — ABNORMAL HIGH (ref 70–99)
Phosphorus: 4.1 mg/dL (ref 2.5–4.6)
Potassium: 3.8 mmol/L (ref 3.5–5.1)
Sodium: 137 mmol/L (ref 135–145)

## 2022-10-29 LAB — GLUCOSE, CAPILLARY
Glucose-Capillary: 94 mg/dL (ref 70–99)
Glucose-Capillary: 104 mg/dL — ABNORMAL HIGH (ref 70–99)
Glucose-Capillary: 90 mg/dL (ref 70–99)

## 2022-10-29 LAB — CBC WITH DIFFERENTIAL/PLATELET
Abs Immature Granulocytes: 0.18 10*3/uL — ABNORMAL HIGH (ref 0.00–0.07)
Basophils Absolute: 0 10*3/uL (ref 0.0–0.1)
Basophils Relative: 0 %
Eosinophils Absolute: 0.1 10*3/uL (ref 0.0–0.5)
Eosinophils Relative: 1 %
HCT: 27.1 % — ABNORMAL LOW (ref 39.0–52.0)
Hemoglobin: 8.4 g/dL — ABNORMAL LOW (ref 13.0–17.0)
Immature Granulocytes: 1 %
Lymphocytes Relative: 3 %
Lymphs Abs: 0.4 10*3/uL — ABNORMAL LOW (ref 0.7–4.0)
MCH: 29.5 pg (ref 26.0–34.0)
MCHC: 31 g/dL (ref 30.0–36.0)
MCV: 95.1 fL (ref 80.0–100.0)
Monocytes Absolute: 0.4 10*3/uL (ref 0.1–1.0)
Monocytes Relative: 3 %
Neutro Abs: 13.4 10*3/uL — ABNORMAL HIGH (ref 1.7–7.7)
Neutrophils Relative %: 92 %
Platelets: 203 10*3/uL (ref 150–400)
RBC: 2.85 MIL/uL — ABNORMAL LOW (ref 4.22–5.81)
RDW: 19.4 % — ABNORMAL HIGH (ref 11.5–15.5)
WBC: 14.6 10*3/uL — ABNORMAL HIGH (ref 4.0–10.5)
nRBC: 0 % (ref 0.0–0.2)

## 2022-10-29 LAB — CARBAPENEM RESISTANCE PANEL
Carba Resistance IMP Gene: NOT DETECTED
Carba Resistance KPC Gene: DETECTED — AB
Carba Resistance NDM Gene: NOT DETECTED
Carba Resistance OXA48 Gene: NOT DETECTED
Carba Resistance VIM Gene: NOT DETECTED

## 2022-10-29 LAB — MAGNESIUM: Magnesium: 2.3 mg/dL (ref 1.7–2.4)

## 2022-10-29 MED ORDER — FOLIC ACID 1 MG PO TABS: 1.0000 mg | ORAL_TABLET | Freq: Every day | ORAL | Status: DC

## 2022-10-29 MED ORDER — SERTRALINE HCL 100 MG PO TABS
100.0000 mg | ORAL_TABLET | Freq: Every day | ORAL | Status: DC
  Administered 2022-10-29: 100 mg
  Filled 2022-10-29: qty 1

## 2022-10-29 MED ORDER — MIDODRINE HCL 10 MG PO TABS: 15.0000 mg | ORAL_TABLET | Freq: Three times a day (TID) | ORAL | Status: DC

## 2022-10-29 MED ORDER — MIDODRINE HCL 5 MG PO TABS
15.0000 mg | ORAL_TABLET | Freq: Three times a day (TID) | ORAL | Status: DC
  Administered 2022-10-29: 15 mg
  Filled 2022-10-29: qty 3

## 2022-10-29 MED ORDER — HEPARIN SODIUM (PORCINE) 5000 UNIT/ML IJ SOLN: 5000.0000 [IU] | Freq: Three times a day (TID) | INTRAMUSCULAR | Status: DC

## 2022-10-29 MED ORDER — PANTOPRAZOLE SODIUM 40 MG IV SOLR
40.0000 mg | INTRAVENOUS | Status: DC
  Administered 2022-10-29: 40 mg via INTRAVENOUS

## 2022-10-29 MED ORDER — SERTRALINE HCL 100 MG PO TABS: 100.0000 mg | ORAL_TABLET | Freq: Every day | ORAL | Status: DC

## 2022-10-29 NOTE — Discharge Summary (Signed)
Physician Discharge Summary       Patient ID: Austin Davis MRN: [id number] DOB/AGE: 1940-12-25 82 y.o.  Admit date: 10/25/2022 Discharge date: 10/29/2022  Discharge Diagnoses:  Principal Problem:   Hypotension Active Problems:   Bacteremia due to Staphylococcus   Goals of care, counseling/discussion   DNR (do not resuscitate) discussion   Catheter-associated urinary tract infection (HCC)   Chronic respiratory failure with hypoxia and hypercapnia (HCC)   Hypomagnesemia   Chronic atrial fibrillation (HCC)   Severe protein-calorie malnutrition (HCC)   History of Present Illness: Patient is a 82 year old male with pertinent PMH chronic hypoxic respiratory failure trached on vent, HFrEF, COPD, PAF not on anticoagulation, CAD, hypothyroidism, MDRO infections presents [facility] on [facility]5 with hypotension.   Patient recently admitted on 9/1 with malfunctioning PEG tube.  Also noted the patient's BP was borderline hypotensive but antibiotics were deferred at that time.  PEG was replaced by Dr. Dossie Der. GJ tube was also replaced by IR.    On arrival to [facility] ED, patient bp soft 97/56.  Afebrile and WBC 9.  LA 1.5.  UA with large leukocytes.  BC x 2 and UC sent and patient started on Vanco/Zosyn.  Patient placed on home vent settings.  ABG 7.38, 63, 56, 40.  CXR with chronic lung disease with interval filtrate at the left base.  Hgb 6.8.  Patient transfused 1 unit. PEG tube also appears to be disolodged. PCCM consulted for ICU admission.  Hospital Course:  Austin Davis is an 82 year old male with pertinent PMH chronic hypoxic respiratory failure trached on vent, HFrEF, COPD, pAF not on anticoagulation, CAD, hypothyroidism, MDRO infections presents from [facility] to [facility] ED on 10/5 with hypotension.   Chronic Respiratory Failure w/ Hypoxia and Hypercarbia  VDRF  Trach Dependence COPD  Acute Pulmonary Edema, improved  Most recent sputum culture 06/30/22  positive for multidrug-resistant Pseudomonas and Providencia. CXR on admission and repeat consistent with chronic disease, patchy infiltrates bilaterally and bilateral atelectasis versus pulmonary edema. Procalcitonin obtained without elevated, infiltrates not favored to be infectious cause. Diuresed appropriately with lasix throughout admission and demonstrated clinical improvement in volume status. He was otherwise maintained on baseline PCV vent settings throughout and given routine pulmonary hygiene with stabilization of oxygen saturations. Given usual bronchodilators throughout admission with no changes to his home regimen by discharge.  Hypotension, resolved  Asymptomatic bacteruria, positive blood cultures (1/2)  Presented to the ED on 10/05 with hypotension. Initially required volume replacement and pressor support with norepi drip (weaned by 10/06). Braod spectrum antibiotics started at that time. Found to have positive urine cultures w/ p mirabilis, kleb pneumo on admission. Asymptomatic from this standpoint. Initial blood cultures with one culture positive for staph capitis. Repeat blood cultures were negative; favored to be contaminant. Initial hypotension treated with volume replacement and started on antibiotics. Off all antibiotics by 10/08. Pressures remained at goal by discharge. Home amlodipine, lasix, and labetalol were discontinued at discharge.   Malpositioned PEG Tube, resolved   Replaced GJ tube by IR 10/08.   Acute on chronic HFrEF w/ G2DD and moderate RV dysfunction pAF (not on chronic anticoagulation 2/2 previous GI bleeding) CAD Developed hypervolemia with anasarca throughout admission after volume resuscitation. Repeat echo this admission with EF decreased to 35%, G2DD, usual RV dysfunction. Given amiodarone for rate control and lasix for diuresis. Clinically improved edema and normalized rate by the time of discharge. On home amiodarone 200mg  every day which was continued at  discharge. Increased midodrine to 15 TID during this admission which was continued at discharge.  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 Hgb below goal on admission at 6.8; bumped appropriately s/p 1u pRBCs. CBC trended and has remained stable.  Chronics:  GERD/Chronic GI ppx: Continued on home PPI  Depression/Anxiety: Continued on home zoloft Hypothyroidism: Continued on home synthroid  Chronic foley: exchanged and routine foley care given throughout admission  Chronic pressure wounds: Followed by [facility] nursing and routine wound care given throughout admission   Significant Hospital tests/studies  Echo 10/07: EF 30-35%, G2DD, usual RV dysfunction  Urine culture +P mirabilis, kleb pneumo  Initial blood culture 10/05: 1/2 cultures+ staph capitis (favored contaminant)   Consults: General surgery, IR for PEG tube replacement   Discharge Exam: BP 98/63   Pulse 63   Temp 97.9 F (36.6 C) (Oral)   Resp (!) 26   Ht 5\' 10"  (1.778 m)   Wt 68.6 kg   SpO2 100%   BMI 21.70 kg/m   General: Chronically critically ill-appearing elderly male.  HEENT: Temporal muscle wasting. Trach secure  Pulm: mechanically ventilated, symmetrical chest expansion (crackles).  CV: NL S1/S2, no m/g/r.  GI: No distension or tenderness. PEG in place without overlying swelling, erythema.  GU: Edematous. Foley in place.  MSK: Extremity edema +2 bilaterally, no acute joint deformity.  Neuro: Facial tremor. Following commands. Generalized weakness throughout.  Skin: Pale. Scattered ecchymosis   Labs at discharge Lab Results  Component Value Date   CREATININE 0.44 (L) 10/29/2022   BUN 19 10/29/2022   NA 137 10/29/2022   K 3.8 10/29/2022   CL 99 10/29/2022   CO2 30 10/29/2022   Lab Results  Component Value Date   WBC 14.6 (H) 10/29/2022   HGB 8.4 (L) 10/29/2022   HCT 27.1 (L) 10/29/2022   MCV 95.1 10/29/2022   PLT 203 10/29/2022   Lab Results   Component Value Date   ALT 15 10/27/2022   AST 12 (L) 10/27/2022   ALKPHOS 152 (H) 10/27/2022   BILITOT 0.2 (L) 10/27/2022   Lab Results  Component Value Date   INR 1.3 (H) 10/25/2022   INR 1.3 (H) 06/29/2022   INR 1.1 03/12/2022    Current radiology studies No results found.  Disposition: Return to facility    Discharge disposition: 63-Long Term Care      Discharge Instructions     Call MD for:  difficulty breathing, headache or visual disturbances   Complete by: As directed    Call MD for:  extreme fatigue   Complete by: As directed    Call MD for:  hives   Complete by: As directed    Call MD for:  persistant dizziness or light-headedness   Complete by: As directed    Call MD for:  persistant nausea and vomiting   Complete by: As directed    Call MD for:  redness, tenderness, or signs of infection (pain, swelling, redness, odor or green/yellow discharge around incision site)   Complete by: As directed    Call MD for:  severe uncontrolled pain   Complete by: As directed    Call MD for:  temperature >100.4   Complete by: As directed    Discharge wound care:   Complete by: As directed    Foam dressing  Every 3 days     Comments: Silicone foam to the left mid buttock; change every 3 days and PRN soilage  Wound care  Daily      Comments: 1. Hydrogel to the right mid back, right ischial tuberosity, left elbow, top with dry dressing; change daily  2. Apply 1/4" thick layer of leptospermum honey to right and left lower extremity wounds, sacral wound, left mid back wound, left ischial tuberosity, left forearm wound beds, top with dry dressing. Change daily.   Increase activity slowly   Complete by: As directed       Allergies as of 10/29/2022       Reactions   Prednisone Other (See Comments)   Documented on MAR Unknown reaction   Cortisone Other (See Comments)   Documented on MAR Unknown reaction        Medication List     STOP taking these medications     amLODipine 5 MG tablet Commonly known as: NORVASC   furosemide 10 MG/ML injection Commonly known as: LASIX   labetalol 5 MG/ML injection Commonly known as: NORMODYNE   METOPROLOL TARTRATE IV       TAKE these medications    acetaminophen 325 MG tablet Commonly known as: TYLENOL Place 650 mg into feeding tube every 8 (eight) hours as needed for fever, headache or moderate pain.   amiodarone 200 MG tablet Commonly known as: PACERONE Place 1 tablet (200 mg total) into feeding tube daily.   budesonide 0.5 MG/2ML nebulizer solution Commonly known as: PULMICORT Take 0.5 mg by nebulization every 12 (twelve) hours.   collagenase 250 UNIT/GM ointment Commonly known as: SANTYL Apply 1 Application topically daily.   doxazosin 4 MG tablet Commonly known as: CARDURA Take 6 mg by mouth daily.   feeding supplement (OSMOLITE 1.5 CAL) Liqd Place 1,000 mLs into feeding tube continuous.   Fish Oil 1000 MG Caps Place 1,000 mg into feeding tube daily.   folic acid 1 MG tablet Commonly known as: FOLVITE Place 1 tablet (1 mg total) into feeding tube daily.   gabapentin 100 MG capsule Commonly known as: NEURONTIN 200 mg every 12 (twelve) hours. Via tube   heparin 5000 UNIT/ML injection Inject 5,000 Units into the skin every 8 (eight) hours.   lansoprazole 30 MG capsule Commonly known as: PREVACID Place 30 mg into feeding tube daily at 12 noon.   leptospermum manuka honey Pste paste Apply 1 Application topically daily.   levalbuterol 1.25 MG/3ML nebulizer solution Commonly known as: XOPENEX Take 1.25 mg by nebulization every 6 (six) hours.   levothyroxine 25 MCG tablet Commonly known as: SYNTHROID Place 25 mcg into feeding tube daily before breakfast.   liver oil-zinc oxide 40 % ointment Commonly known as: DESITIN Apply topically 2 (two) times daily.   LORazepam 1 MG tablet Commonly known as: ATIVAN Place 1 mg into feeding tube every 8 (eight) hours.   midodrine  10 MG tablet Commonly known as: PROAMATINE Take 1.5 tablets (15 mg total) by mouth every 8 (eight) hours. What changed: how much to take   multivitamin tablet Place 1 tablet into feeding tube daily.   polyethylene glycol 17 g packet Commonly known as: MIRALAX / GLYCOLAX Place 17 g into feeding tube daily.   sertraline 100 MG tablet Commonly known as: ZOLOFT Place 100 mg into feeding tube daily.   sodium chloride 0.65 % Soln nasal spray Commonly known as: OCEAN Place 1 spray into both nostrils every 8 (eight) hours.               Discharge Care Instructions  (From admission, onward)  Start     Ordered   10/29/22 0000  Discharge wound care:       Comments: Foam dressing  Every 3 days     Comments: Silicone foam to the left mid buttock; change every 3 days and PRN soilage Wound care  Daily      Comments: 1. Hydrogel to the right mid back, right ischial tuberosity, left elbow, top with dry dressing; change daily  2. Apply 1/4" thick layer of leptospermum honey to right and left lower extremity wounds, sacral wound, left mid back wound, left ischial tuberosity, left forearm wound beds, top with dry dressing. Change daily.   10/29/22 1257            Discharged Condition: stable   Rosario Adie, MS4 [facility]  10/29/22 1:04 PM

## 2022-10-29 NOTE — Consult Note (Addendum)
Value-Based Care Institute  [facility] Inpatient Consult   10/29/2022  JARAD BARTH 1940-09-01 [id number]  [facility] [[facility]]  Accountable Care Organization [ACO] Patient: Medicare ACO REACH   Primary Care Provider:  Patrecia Pour, MD, [facility]    Patient was reviewed for [facility] banner for readmission extreme high risk score   No Community Care Coordination needs assessed as patient to return to LTC at [facility] Plan:  Patient's level of care to LTC at [facility] remains the level of care to support post hospital needs for trach on vent needs.  For questions or referrals, please contact:   Charlesetta Shanks, RN BSN CCM [facility]  [phone] business mobile phone Toll free office [phone]  Fax number: [phone] [email or web address]

## 2022-10-29 NOTE — TOC Transition Note (Addendum)
Transition of Care [facility]) - CM/SW Discharge Note   Patient Details  Name: Austin Davis MRN: [id number] Date of Birth: Mar 25, 1940  Transition of Care [facility]) CM/SW Contact:  Erin Sons, LCSW Phone Number: 10/29/2022, 1:46 PM   Clinical Narrative:     Per MD patient ready for DC to [facility]. RN, patient, and facility notified of DC. Discharge Summary and FL2 sent to facility. RN to call report prior to discharge [phone] Room 310 Accepting doctor Dr. Lum Keas). RN to arrange [facility].  CSW will sign off for now as social work intervention is no longer needed. Please consult [place] again if new needs arise.     Barriers to Discharge: No Barriers Identified   Patient Goals and CMS Choice      Discharge Placement                Patient chooses bed at:  ([facility]) Patient to be transferred to facility by: [facility] Name of family member notified: Rosalita Chessman DSS guardian Patient and family notified of of transfer: 10/29/22  Discharge Plan and Services Additional resources added to the After Visit Summary for                                       Social Determinants of Health (SDOH) Interventions SDOH Screenings   Alcohol Screen: Low Risk  (04/12/2021)  Tobacco Use: Low Risk  (10/25/2022)     Readmission Risk Interventions    03/18/2022   11:45 AM 03/03/2022   12:04 PM 02/07/2022   11:58 AM  Readmission Risk Prevention Plan  Transportation Screening Complete Complete Complete  Medication Review Oceanographer) Complete Complete   PCP or Specialist appointment within 3-5 days of discharge Complete Complete   HRI or Home Care Consult Complete Complete   SW Recovery Care/Counseling Consult Complete Complete Complete  Palliative Care Screening Not Applicable Not Applicable   Skilled Nursing Facility Complete Complete Complete

## 2022-10-29 NOTE — Progress Notes (Signed)
NAME:  Austin Davis, MRN:  [id number], DOB:  02/15/1940, LOS: 4 ADMISSION DATE:  10/25/2022, CONSULTATION DATE:  10/5 REFERRING MD:  Dr. Pilar Plate, CHIEF COMPLAINT:  Hypotension   History of Present Illness:  Patient is a 82 year old male with pertinent PMH chronic hypoxic respiratory failure trached on vent, HFrEF, COPD, PAF not on anticoagulation, CAD, hypothyroidism, MDRO infections presents from [facility] to [facility] ED on 10/5 with hypotension.  Patient recently admitted on 9/1 with malfunctioning PEG tube.  Also noted the patient's BP was borderline hypotensive but antibiotics were deferred at that time.  PEG was replaced by Dr. Dossie Der. GJ tube was also replaced by IR.   On arrival to [facility] ED, patient bp soft 97/56.  Afebrile and WBC 9.  LA 1.5.  UA with large leukocytes.  BC x 2 and UC sent and patient started on Vanco/Zosyn.  Patient placed on home vent settings.  ABG 7.38, 63, 56, 40.  CXR with chronic lung disease with interval filtrate at the left base.  Hgb 6.8.  Patient transfused 1 unit. PEG tube also appears to be disolodged. PCCM consulted for ICU admission.  Pertinent  Medical History   Past Medical History:  Diagnosis Date   Aspiration pneumonia (HCC) 03/25/2021   CHF (congestive heart failure) (HCC)    Coronary artery disease    Essential tremor    GERD (gastroesophageal reflux disease)    GI bleed 2019   hospitalized at [facility] for one week   Headache    since childhood   Hypothyroidism    Low blood sugar    since childhood, controlled by diet   Mitral regurgitation    Mitral valve prolapse    Osteopenia 2021   Paroxysmal atrial fibrillation (HCC)    Seizure-like activity (HCC)    Right gaze preference at baseline, essential termor at baseline, intermittent reduced responsiveness secondary to toxic/metabolic processes and poor cognitive reserve   Tremor      Significant Hospital Events: Including procedures, antibiotic start and stop dates in  addition to other pertinent events   10/5 soft bp; UA indicative of UTI; pccm consulted.  Hypotensive while in the ER, decision made to transfer to critical care service started on norepinephrine after volume replacement.  Right-sided PICC placed.  As he continued to be hypotensive meropenem and vancomycin were added, initially did not think he was infected 10/6 blood cultures positive for GPC, norepinephrine weaned off, on amiodarone drip.  Still awaiting PEG placement.  Hemoglobin with appropriate bump from 6.8-7.8 following transfusion today prior. Mero and zyvox  10/7 awaiting PEG replacement  10/8 dc zyvox w bcx showing staph capitis  Had need PEG tube by IR 10/28/2022 and started TF and enteral medications.  Interim History / Subjective:   In sinus. BP is stable, afebrile, on PC 32/RR26/+5/50% Positive fluid balance 712 cc in the last 24 hrs   Objective   Blood pressure 94/63, pulse 76, temperature 97.9 F (36.6 C), temperature source Oral, resp. rate (!) 26, height 5\' 10"  (1.778 m), weight 68.6 kg, SpO2 98%.    Vent Mode: PCV FiO2 (%):  [40 %-50 %] 50 % Set Rate:  [26 bmp] 26 bmp PEEP:  [5 cmH20] 5 cmH20 Plateau Pressure:  [29 cmH20-35 cmH20] 29 cmH20   Intake/Output Summary (Last 24 hours) at 10/29/2022 0837 Last data filed at 10/29/2022 0600 Gross per 24 hour  Intake 1345.5 ml  Output 600 ml  Net 745.5 ml   Filed Weights   10/26/22  0206 10/27/22 0237 10/28/22 0500  Weight: 54.9 kg 60 kg 68.6 kg    Examination: Gen- chronically critically ill elderly M  Neuro: facial tremor. Following commands. Generalized weakness  HEENT: Temporal muscle wasting. Trach secure  Pulm: mechanically ventilated, symmetrical chest expansion (crackles) CV: NL S1/S2, no m/g/r GI: no distension or tenderness. PEG looks fine GU: edematous. Foley  MSK: extremity edema +2 no acute joint deformity  Skin: pale. Scattered ecchymosis    Resolved Hospital Problem list     Assessment & Plan:     Chronic resp failure w hypoxia and hypercarbia VDRF Trach dependence Hx COPD -Most recent sputum culture 06/30/22 positive for multidrug-resistant Pseudomonas and Providencia P -cont MV support-- is on baseline PCV settings and does not wean  -VAP pulm hygiene  -pulmicort, PRN xopenex  -IV lasix  Malpositioned PEG  Plan: -s/p IR replacement 10/28/2022, on TF   Sepsis: r/o. Has asymptomatic bacteruria. Bcx w 1x staph capitis which is more likely contaminant P -with suspected contaminant, dc zyvox. -Colonization: d/c mero  pAF not on chronic AC, due to previous GI bleeding CAD AoC Chronic HFrEF with G2dd and moderate RV dysfxn -ECHO 10/7 w EF decr to 35% g2dd, usual RV dysfunction  P -amio  -IV lasix  -no systemic AC  -Increased Midodrine  AoC anemia -chronic blood loss, chronic dz  S/p 1 unit blood 10/5 P -not on systemic AC, SCDs for ppx  -PRN CBC  Hypothyroidism   -synthroid per tube  Depression/anxiety Plan -zoloft   Severe protein calorie malnutrition  Hypokalemia and hypomagnesemia: resolved   Pressure wounds POA -WOC   Chronic foley  -with degree of GU swelling would need uro to replace   GOC discussion DNR discussion -remains full code full scope of offered care at present. Talked to him about my concerns & recommend he revisit minimally code status discussions with his family   Best Practice (right click and "Reselect all SmartList Selections" daily)   Best Practice (right click and "Reselect all SmartList Selections" daily)   Diet/type: tubefeeds (pending tube replacement)  DVT prophylaxis: SCD GI prophylaxis: PPI Lines: N/A Foley:  Yes, and it is still needed Code Status:  full code Last date of multidisciplinary goals of care discussion [pending]  CRITICAL CARE Performed by: Patrici Ranks   Total critical care time: 36 minutes  Critical care time was exclusive of separately billable procedures and treating other  patients. Critical care was necessary to treat or prevent imminent or life-threatening deterioration.  Critical care was time spent personally by me on the following activities: development of treatment plan with patient and/or surrogate as well as nursing, discussions with consultants, evaluation of patient's response to treatment, examination of patient, obtaining history from patient or surrogate, ordering and performing treatments and interventions, ordering and review of laboratory studies, ordering and review of radiographic studies, pulse oximetry and re-evaluation of patient's condition.  Brendolyn Patty, MD [facility] Pulmonary/Critical Care Medicine Amion for pager  10/29/2022, 8:37 AM

## 2022-10-29 NOTE — Progress Notes (Addendum)
  IR Note  Dislodged GJ tube  Replaced in IR yesterday RN says leaking and request evaluation  Upon inspection GJ tube has 3 spilt gauze beneath bumper to skin Leaking dark yellow fluid from site Cleaned area:  no bleeding; no sign of infection; no sign of breakdown Cinched bumper to skin gently. RN injected 20-30 cc sterile water without leakage from site. Waited few minutes--- still no leaking  Rec: keep only one gauze beneath bumper at skin site --- at most. Bumper to skin is acceptable also Instructed visually to RN bumper and cinching technique.  RN aware and agreeable

## 2022-10-30 DIAGNOSIS — R0989 Other specified symptoms and signs involving the circulatory and respiratory systems: Secondary | ICD-10-CM | POA: Diagnosis not present

## 2022-10-30 DIAGNOSIS — J9621 Acute and chronic respiratory failure with hypoxia: Secondary | ICD-10-CM | POA: Diagnosis not present

## 2022-10-30 LAB — CULTURE, BLOOD (ROUTINE X 2)
Culture: NO GROWTH
Special Requests: ADEQUATE

## 2022-11-01 LAB — CULTURE, BLOOD (ROUTINE X 2)
Culture: NO GROWTH
Culture: NO GROWTH
Special Requests: ADEQUATE

## 2022-11-02 ENCOUNTER — Emergency Department (HOSPITAL_COMMUNITY): Payer: Medicare Other

## 2022-11-02 ENCOUNTER — Encounter (HOSPITAL_COMMUNITY): Payer: Self-pay

## 2022-11-02 ENCOUNTER — Inpatient Hospital Stay (HOSPITAL_COMMUNITY)
Admission: EM | Admit: 2022-11-02 | Discharge: 2022-11-21 | DRG: 871 | Disposition: E | Payer: Medicare Other | Source: Other Acute Inpatient Hospital | Attending: Pulmonary Disease | Admitting: Pulmonary Disease

## 2022-11-02 DIAGNOSIS — Z9911 Dependence on respirator [ventilator] status: Secondary | ICD-10-CM

## 2022-11-02 DIAGNOSIS — Z820 Family history of epilepsy and other diseases of the nervous system: Secondary | ICD-10-CM

## 2022-11-02 DIAGNOSIS — J150 Pneumonia due to Klebsiella pneumoniae: Secondary | ICD-10-CM | POA: Diagnosis not present

## 2022-11-02 DIAGNOSIS — I482 Chronic atrial fibrillation, unspecified: Secondary | ICD-10-CM | POA: Diagnosis present

## 2022-11-02 DIAGNOSIS — I9589 Other hypotension: Secondary | ICD-10-CM | POA: Diagnosis present

## 2022-11-02 DIAGNOSIS — G9340 Encephalopathy, unspecified: Secondary | ICD-10-CM | POA: Diagnosis present

## 2022-11-02 DIAGNOSIS — Z452 Encounter for adjustment and management of vascular access device: Secondary | ICD-10-CM | POA: Diagnosis not present

## 2022-11-02 DIAGNOSIS — Z931 Gastrostomy status: Secondary | ICD-10-CM | POA: Diagnosis not present

## 2022-11-02 DIAGNOSIS — J9611 Chronic respiratory failure with hypoxia: Secondary | ICD-10-CM | POA: Diagnosis present

## 2022-11-02 DIAGNOSIS — Z808 Family history of malignant neoplasm of other organs or systems: Secondary | ICD-10-CM

## 2022-11-02 DIAGNOSIS — K219 Gastro-esophageal reflux disease without esophagitis: Secondary | ICD-10-CM | POA: Diagnosis present

## 2022-11-02 DIAGNOSIS — Z7951 Long term (current) use of inhaled steroids: Secondary | ICD-10-CM

## 2022-11-02 DIAGNOSIS — J9621 Acute and chronic respiratory failure with hypoxia: Secondary | ICD-10-CM | POA: Diagnosis present

## 2022-11-02 DIAGNOSIS — A419 Sepsis, unspecified organism: Principal | ICD-10-CM | POA: Diagnosis present

## 2022-11-02 DIAGNOSIS — I5032 Chronic diastolic (congestive) heart failure: Secondary | ICD-10-CM | POA: Diagnosis present

## 2022-11-02 DIAGNOSIS — N179 Acute kidney failure, unspecified: Secondary | ICD-10-CM | POA: Diagnosis present

## 2022-11-02 DIAGNOSIS — J449 Chronic obstructive pulmonary disease, unspecified: Secondary | ICD-10-CM | POA: Diagnosis present

## 2022-11-02 DIAGNOSIS — R64 Cachexia: Secondary | ICD-10-CM | POA: Diagnosis present

## 2022-11-02 DIAGNOSIS — R918 Other nonspecific abnormal finding of lung field: Secondary | ICD-10-CM | POA: Diagnosis not present

## 2022-11-02 DIAGNOSIS — I480 Paroxysmal atrial fibrillation: Secondary | ICD-10-CM | POA: Diagnosis present

## 2022-11-02 DIAGNOSIS — E039 Hypothyroidism, unspecified: Secondary | ICD-10-CM | POA: Diagnosis present

## 2022-11-02 DIAGNOSIS — I959 Hypotension, unspecified: Secondary | ICD-10-CM | POA: Diagnosis not present

## 2022-11-02 DIAGNOSIS — Z79890 Hormone replacement therapy (postmenopausal): Secondary | ICD-10-CM

## 2022-11-02 DIAGNOSIS — D649 Anemia, unspecified: Secondary | ICD-10-CM | POA: Diagnosis not present

## 2022-11-02 DIAGNOSIS — Z930 Tracheostomy status: Secondary | ICD-10-CM | POA: Diagnosis not present

## 2022-11-02 DIAGNOSIS — R0989 Other specified symptoms and signs involving the circulatory and respiratory systems: Secondary | ICD-10-CM | POA: Diagnosis not present

## 2022-11-02 DIAGNOSIS — Z1152 Encounter for screening for COVID-19: Secondary | ICD-10-CM | POA: Diagnosis not present

## 2022-11-02 DIAGNOSIS — Z515 Encounter for palliative care: Secondary | ICD-10-CM

## 2022-11-02 DIAGNOSIS — N170 Acute kidney failure with tubular necrosis: Secondary | ICD-10-CM | POA: Diagnosis not present

## 2022-11-02 DIAGNOSIS — Z79899 Other long term (current) drug therapy: Secondary | ICD-10-CM

## 2022-11-02 DIAGNOSIS — J9622 Acute and chronic respiratory failure with hypercapnia: Secondary | ICD-10-CM | POA: Diagnosis present

## 2022-11-02 DIAGNOSIS — R0902 Hypoxemia: Secondary | ICD-10-CM | POA: Diagnosis not present

## 2022-11-02 DIAGNOSIS — Z1624 Resistance to multiple antibiotics: Secondary | ICD-10-CM | POA: Diagnosis not present

## 2022-11-02 DIAGNOSIS — Z888 Allergy status to other drugs, medicaments and biological substances status: Secondary | ICD-10-CM

## 2022-11-02 DIAGNOSIS — I5042 Chronic combined systolic (congestive) and diastolic (congestive) heart failure: Secondary | ICD-10-CM | POA: Diagnosis present

## 2022-11-02 DIAGNOSIS — R6521 Severe sepsis with septic shock: Secondary | ICD-10-CM | POA: Diagnosis not present

## 2022-11-02 DIAGNOSIS — G250 Essential tremor: Secondary | ICD-10-CM | POA: Diagnosis present

## 2022-11-02 DIAGNOSIS — Z66 Do not resuscitate: Secondary | ICD-10-CM | POA: Diagnosis present

## 2022-11-02 DIAGNOSIS — E874 Mixed disorder of acid-base balance: Secondary | ICD-10-CM | POA: Diagnosis present

## 2022-11-02 DIAGNOSIS — Z8249 Family history of ischemic heart disease and other diseases of the circulatory system: Secondary | ICD-10-CM

## 2022-11-02 DIAGNOSIS — Z6821 Body mass index (BMI) 21.0-21.9, adult: Secondary | ICD-10-CM

## 2022-11-02 DIAGNOSIS — Z7189 Other specified counseling: Secondary | ICD-10-CM

## 2022-11-02 DIAGNOSIS — R54 Age-related physical debility: Secondary | ICD-10-CM | POA: Diagnosis present

## 2022-11-02 DIAGNOSIS — Z825 Family history of asthma and other chronic lower respiratory diseases: Secondary | ICD-10-CM

## 2022-11-02 DIAGNOSIS — R627 Adult failure to thrive: Secondary | ICD-10-CM | POA: Diagnosis present

## 2022-11-02 DIAGNOSIS — E8809 Other disorders of plasma-protein metabolism, not elsewhere classified: Secondary | ICD-10-CM | POA: Diagnosis present

## 2022-11-02 DIAGNOSIS — E43 Unspecified severe protein-calorie malnutrition: Secondary | ICD-10-CM | POA: Diagnosis present

## 2022-11-02 DIAGNOSIS — Z823 Family history of stroke: Secondary | ICD-10-CM

## 2022-11-02 DIAGNOSIS — J440 Chronic obstructive pulmonary disease with acute lower respiratory infection: Secondary | ICD-10-CM | POA: Diagnosis present

## 2022-11-02 DIAGNOSIS — I110 Hypertensive heart disease with heart failure: Secondary | ICD-10-CM | POA: Diagnosis present

## 2022-11-02 DIAGNOSIS — I2510 Atherosclerotic heart disease of native coronary artery without angina pectoris: Secondary | ICD-10-CM | POA: Diagnosis present

## 2022-11-02 LAB — PHOSPHORUS: Phosphorus: 2.4 mg/dL — ABNORMAL LOW (ref 2.5–4.6)

## 2022-11-02 LAB — CBC WITH DIFFERENTIAL/PLATELET
Abs Immature Granulocytes: 0.03 10*3/uL (ref 0.00–0.07)
Basophils Absolute: 0 10*3/uL (ref 0.0–0.1)
Basophils Relative: 0 %
Eosinophils Absolute: 0 10*3/uL (ref 0.0–0.5)
Eosinophils Relative: 0 %
HCT: 26.3 % — ABNORMAL LOW (ref 39.0–52.0)
Hemoglobin: 7.5 g/dL — ABNORMAL LOW (ref 13.0–17.0)
Immature Granulocytes: 1 %
Lymphocytes Relative: 2 %
Lymphs Abs: 0.1 10*3/uL — ABNORMAL LOW (ref 0.7–4.0)
MCH: 28.5 pg (ref 26.0–34.0)
MCHC: 28.5 g/dL — ABNORMAL LOW (ref 30.0–36.0)
MCV: 100 fL (ref 80.0–100.0)
Monocytes Absolute: 0.1 10*3/uL (ref 0.1–1.0)
Monocytes Relative: 2 %
Neutro Abs: 4.3 10*3/uL (ref 1.7–7.7)
Neutrophils Relative %: 95 %
Platelets: 78 10*3/uL — ABNORMAL LOW (ref 150–400)
RBC: 2.63 MIL/uL — ABNORMAL LOW (ref 4.22–5.81)
RDW: 19.9 % — ABNORMAL HIGH (ref 11.5–15.5)
WBC: 4.5 10*3/uL (ref 4.0–10.5)
nRBC: 0 % (ref 0.0–0.2)

## 2022-11-02 LAB — COMPREHENSIVE METABOLIC PANEL
ALT: 16 U/L (ref 0–44)
AST: 15 U/L (ref 15–41)
Albumin: 1.5 g/dL — ABNORMAL LOW (ref 3.5–5.0)
Alkaline Phosphatase: 163 U/L — ABNORMAL HIGH (ref 38–126)
Anion gap: 7 (ref 5–15)
BUN: 50 mg/dL — ABNORMAL HIGH (ref 8–23)
CO2: 32 mmol/L (ref 22–32)
Calcium: 8 mg/dL — ABNORMAL LOW (ref 8.9–10.3)
Chloride: 99 mmol/L (ref 98–111)
Creatinine, Ser: 0.58 mg/dL — ABNORMAL LOW (ref 0.61–1.24)
GFR, Estimated: >60 mL/min (ref >60)
Glucose, Bld: 168 mg/dL — ABNORMAL HIGH (ref 70–99)
Potassium: 5.1 mmol/L (ref 3.5–5.1)
Sodium: 138 mmol/L (ref 135–145)
Total Bilirubin: 0.4 mg/dL (ref 0.3–1.2)
Total Protein: 5.2 g/dL — ABNORMAL LOW (ref 6.5–8.1)

## 2022-11-02 LAB — MAGNESIUM: Magnesium: 2.2 mg/dL (ref 1.7–2.4)

## 2022-11-02 LAB — TSH: TSH: 7.341 u[IU]/mL — ABNORMAL HIGH (ref 0.350–4.500)

## 2022-11-02 LAB — RESP PANEL BY RT-PCR (RSV, FLU A&B, COVID)  RVPGX2
Influenza A by PCR: NEGATIVE
Influenza B by PCR: NEGATIVE
Resp Syncytial Virus by PCR: NEGATIVE
SARS Coronavirus 2 by RT PCR: NEGATIVE

## 2022-11-02 LAB — CBC
HCT: 29.3 % — ABNORMAL LOW (ref 39.0–52.0)
Hemoglobin: 8.3 g/dL — ABNORMAL LOW (ref 13.0–17.0)
MCH: 28.8 pg (ref 26.0–34.0)
MCHC: 28.3 g/dL — ABNORMAL LOW (ref 30.0–36.0)
MCV: 101.7 fL — ABNORMAL HIGH (ref 80.0–100.0)
Platelets: 99 10*3/uL — ABNORMAL LOW (ref 150–400)
RBC: 2.88 MIL/uL — ABNORMAL LOW (ref 4.22–5.81)
RDW: 19.9 % — ABNORMAL HIGH (ref 11.5–15.5)
WBC: 4.3 10*3/uL (ref 4.0–10.5)
nRBC: 0 % (ref 0.0–0.2)

## 2022-11-02 LAB — PROTIME-INR
INR: 1.1 (ref 0.8–1.2)
Prothrombin Time: 14.1 s (ref 11.4–15.2)

## 2022-11-02 LAB — CBG MONITORING, ED: Glucose-Capillary: 94 mg/dL (ref 70–99)

## 2022-11-02 LAB — I-STAT CG4 LACTIC ACID, ED: Lactic Acid, Venous: 1.9 mmol/L (ref 0.5–1.9)

## 2022-11-02 LAB — GLUCOSE, CAPILLARY
Glucose-Capillary: 63 mg/dL — ABNORMAL LOW (ref 70–99)
Glucose-Capillary: 75 mg/dL (ref 70–99)
Glucose-Capillary: 90 mg/dL (ref 70–99)

## 2022-11-02 LAB — CREATININE, SERUM
Creatinine, Ser: 0.53 mg/dL — ABNORMAL LOW (ref 0.61–1.24)
GFR, Estimated: >60 mL/min (ref >60)

## 2022-11-02 LAB — APTT: aPTT: 42 s — ABNORMAL HIGH (ref 24–36)

## 2022-11-02 LAB — PROCALCITONIN: Procalcitonin: 0.2 ng/mL

## 2022-11-02 LAB — T4, FREE: Free T4: 0.67 ng/dL (ref 0.61–1.12)

## 2022-11-02 MED ORDER — SODIUM CHLORIDE 0.9 % IV SOLN
2.0000 g | Freq: Three times a day (TID) | INTRAVENOUS | Status: DC
  Administered 2022-11-02 – 2022-11-03 (×2): 2 g via INTRAVENOUS
  Filled 2022-11-02 (×2): qty 12.5

## 2022-11-02 MED ORDER — VASOPRESSIN 20 UNITS/100 ML INFUSION FOR SHOCK
0.0000 [IU]/min | INTRAVENOUS | Status: DC
  Administered 2022-11-03 (×2): 0.03 [IU]/min via INTRAVENOUS
  Filled 2022-11-02 (×3): qty 100

## 2022-11-02 MED ORDER — PROSOURCE TF20 ENFIT COMPATIBL EN LIQD
60.0000 mL | Freq: Every day | ENTERAL | Status: DC
  Administered 2022-11-02: 60 mL
  Filled 2022-11-02 (×2): qty 60

## 2022-11-02 MED ORDER — NOREPINEPHRINE 4 MG/250ML-% IV SOLN
2.0000 ug/min | INTRAVENOUS | Status: DC
  Administered 2022-11-02: 2 ug/min via INTRAVENOUS
  Filled 2022-11-02: qty 250

## 2022-11-02 MED ORDER — POLYETHYLENE GLYCOL 3350 17 G PO PACK: 17.0000 g | PACK | Freq: Every day | ORAL | Status: DC | PRN

## 2022-11-02 MED ORDER — VANCOMYCIN HCL 1500 MG/300ML IV SOLN
1500.0000 mg | INTRAVENOUS | Status: DC
  Filled 2022-11-02: qty 300

## 2022-11-02 MED ORDER — VANCOMYCIN HCL IN DEXTROSE 1-5 GM/200ML-% IV SOLN: 1000.0000 mg | Freq: Once | INTRAVENOUS | Status: DC

## 2022-11-02 MED ORDER — HEPARIN SODIUM (PORCINE) 5000 UNIT/ML IJ SOLN
5000.0000 [IU] | Freq: Three times a day (TID) | INTRAMUSCULAR | Status: DC
  Administered 2022-11-02 – 2022-11-03 (×2): 5000 [IU] via SUBCUTANEOUS
  Filled 2022-11-02 (×2): qty 1

## 2022-11-02 MED ORDER — VANCOMYCIN HCL 1250 MG/250ML IV SOLN
1250.0000 mg | Freq: Once | INTRAVENOUS | Status: AC
  Administered 2022-11-02: 1250 mg via INTRAVENOUS
  Filled 2022-11-02 (×2): qty 250

## 2022-11-02 MED ORDER — LACTATED RINGERS IV BOLUS (SEPSIS): 1000.0000 mL | Freq: Once | INTRAVENOUS | Status: DC

## 2022-11-02 MED ORDER — LACTATED RINGERS IV BOLUS (SEPSIS)
1000.0000 mL | Freq: Once | INTRAVENOUS | Status: AC
  Administered 2022-11-02: 1000 mL via INTRAVENOUS

## 2022-11-02 MED ORDER — LACTATED RINGERS IV BOLUS (SEPSIS): 250.0000 mL | Freq: Once | INTRAVENOUS | Status: DC

## 2022-11-02 MED ORDER — METRONIDAZOLE 500 MG/100ML IV SOLN
500.0000 mg | Freq: Once | INTRAVENOUS | Status: AC
  Administered 2022-11-02: 500 mg via INTRAVENOUS
  Filled 2022-11-02: qty 100

## 2022-11-02 MED ORDER — VASOPRESSIN 20 UNITS/100 ML INFUSION FOR SHOCK
INTRAVENOUS | Status: AC
  Administered 2022-11-02: 0.03 [IU]/min via INTRAVENOUS
  Filled 2022-11-02: qty 100

## 2022-11-02 MED ORDER — SODIUM CHLORIDE 0.9 % IV SOLN
2.0000 g | Freq: Once | INTRAVENOUS | Status: AC
  Administered 2022-11-02: 2 g via INTRAVENOUS
  Filled 2022-11-02: qty 12.5

## 2022-11-02 MED ORDER — SODIUM CHLORIDE 0.9 % IV SOLN: 250.0000 mL | INTRAVENOUS | Status: DC

## 2022-11-02 MED ORDER — FAMOTIDINE 20 MG PO TABS
20.0000 mg | ORAL_TABLET | Freq: Two times a day (BID) | ORAL | Status: DC
  Administered 2022-11-02: 20 mg
  Filled 2022-11-02 (×2): qty 1

## 2022-11-02 MED ORDER — LACTATED RINGERS IV SOLN: INTRAVENOUS | Status: DC

## 2022-11-02 MED ORDER — DEXTROSE 50 % IV SOLN
INTRAVENOUS | Status: AC
  Filled 2022-11-02: qty 50

## 2022-11-02 MED ORDER — ALBUMIN HUMAN 25 % IV SOLN
25.0000 g | Freq: Four times a day (QID) | INTRAVENOUS | Status: AC
  Administered 2022-11-02 – 2022-11-03 (×3): 25 g via INTRAVENOUS
  Filled 2022-11-02 (×4): qty 100

## 2022-11-02 MED ORDER — PIVOT 1.5 CAL PO LIQD
1000.0000 mL | ORAL | Status: DC
  Administered 2022-11-02: 1000 mL
  Filled 2022-11-02: qty 1000

## 2022-11-02 MED ORDER — NOREPINEPHRINE 4 MG/250ML-% IV SOLN
0.0000 ug/min | INTRAVENOUS | Status: DC
  Filled 2022-11-02: qty 250

## 2022-11-02 MED ORDER — NOREPINEPHRINE 16 MG/250ML-% IV SOLN
0.0000 ug/min | INTRAVENOUS | Status: DC
  Administered 2022-11-02: 30 ug/min via INTRAVENOUS
  Administered 2022-11-03 (×2): 40 ug/min via INTRAVENOUS
  Filled 2022-11-02 (×3): qty 250

## 2022-11-02 MED ORDER — DOCUSATE SODIUM 100 MG PO CAPS: 100.0000 mg | ORAL_CAPSULE | Freq: Two times a day (BID) | ORAL | Status: DC | PRN

## 2022-11-02 NOTE — ED Provider Notes (Signed)
[place] EMERGENCY DEPARTMENT AT [facility] Provider Note   CSN: [id number] Arrival date & time: 11/01/2022  1044     History {Add pertinent medical, surgical, social history, OB history to HPI:1} Chief Complaint  Patient presents with   Hypotension    RAFID WATT is a 82 y.o. male.  HPI   Patient presents ED for evaluation of low blood pressure.  Patient has a complicated medical history.  He is chronically vent dependent.  Patient was recently released from the hospital on October 9.  Patient was treated for hypotension bacteremia catheter associated UTI chronic respiratory failure.  Patient was noted to have issues with hypotension while he was in the hospital.  His blood pressure improved.  Patient presents ED today for evaluation of low blood pressure.  Reportedly patient has had hypotension and no urine output in the last 24 hours.  [facility] noted normal blood pressures on their assessment.  Home Medications Prior to Admission medications   Medication Sig Start Date End Date Taking? Authorizing Provider  acetaminophen (TYLENOL) 325 MG tablet Place 650 mg into feeding tube every 8 (eight) hours as needed for fever, headache or moderate pain.    [provider]  amiodarone (PACERONE) 200 MG tablet Place 1 tablet (200 mg total) into feeding tube daily. 07/05/22   Simonne Martinet, NP  budesonide (PULMICORT) 0.5 MG/2ML nebulizer solution Take 0.5 mg by nebulization every 12 (twelve) hours.    [provider]  collagenase (SANTYL) 250 UNIT/GM ointment Apply 1 Application topically daily.    [provider]  doxazosin (CARDURA) 4 MG tablet Take 6 mg by mouth daily.    [provider]  folic acid (FOLVITE) 1 MG tablet Place 1 tablet (1 mg total) into feeding tube daily. 10/29/22   Albustami, Flonnie Hailstone, MD  gabapentin (NEURONTIN) 100 MG capsule 200 mg every 12 (twelve) hours. Via tube    [provider]  heparin 5000 UNIT/ML  injection Inject 5,000 Units into the skin every 8 (eight) hours.    [provider]  lansoprazole (PREVACID) 30 MG capsule Place 30 mg into feeding tube daily at 12 noon.    [provider]  leptospermum manuka honey (MEDIHONEY) PSTE paste Apply 1 Application topically daily. Patient not taking: Reported on 10/25/2022 09/26/22   Champ Mungo, DO  levalbuterol Pauline Aus) 1.25 MG/3ML nebulizer solution Take 1.25 mg by nebulization every 6 (six) hours.    [provider]  levothyroxine (SYNTHROID) 25 MCG tablet Place 25 mcg into feeding tube daily before breakfast.    [provider]  liver oil-zinc oxide (DESITIN) 40 % ointment Apply topically 2 (two) times daily. 09/25/22   Champ Mungo, DO  LORazepam (ATIVAN) 1 MG tablet Place 1 mg into feeding tube every 8 (eight) hours.    [provider]  midodrine (PROAMATINE) 10 MG tablet Take 1.5 tablets (15 mg total) by mouth every 8 (eight) hours. 10/29/22   Patrici Ranks, MD  Multiple Vitamin (MULTIVITAMIN) tablet Place 1 tablet into feeding tube daily.    [provider]  Nutritional Supplements (FEEDING SUPPLEMENT, OSMOLITE 1.5 CAL,) LIQD Place 1,000 mLs into feeding tube continuous. 09/10/22   Masters, Katie, DO  Omega-3 Fatty Acids (FISH OIL) 1000 MG CAPS Place 1,000 mg into feeding tube daily.    [provider]  polyethylene glycol (MIRALAX / GLYCOLAX) 17 g packet Place 17 g into feeding tube daily.    [provider]  sertraline (ZOLOFT) 100 MG  tablet Place 100 mg into feeding tube daily.    [provider]  sodium chloride (OCEAN) 0.65 % SOLN nasal spray Place 1 spray into both nostrils every 8 (eight) hours.    [provider]      Allergies    Prednisone and Cortisone    Review of Systems   Review of Systems  Physical Exam Updated Vital Signs Pulse (!) 56   Temp (!) 84.1 F (28.9 C) (Rectal)   Resp 18   Ht 1.778 m (5\' 10" )   Wt 68.6 kg   SpO2 99%    BMI 21.70 kg/m  Physical Exam Vitals and nursing note reviewed.  Constitutional:      Appearance: He is well-developed. He is ill-appearing.  HENT:     Head: Normocephalic and atraumatic.     Right Ear: External ear normal.     Left Ear: External ear normal.  Eyes:     General: No scleral icterus.       Right eye: No discharge.        Left eye: No discharge.     Conjunctiva/sclera: Conjunctivae normal.  Neck:     Trachea: No tracheal deviation.  Cardiovascular:     Rate and Rhythm: Normal rate and regular rhythm.  Pulmonary:     Effort: Pulmonary effort is normal. No respiratory distress.     Breath sounds: Normal breath sounds. No stridor. No wheezing or rales.     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 There is no distension.     Palpations: Abdomen is soft.     Tenderness: There is no abdominal tenderness. There is no guarding or rebound.  Genitourinary:    Comments: Loose brown stool noted on rectal exam, some skin abrasions noted in the scrotum and the buttock after patient received diaper cleaning after his stool incontinence Musculoskeletal:        General: No tenderness or deformity.     Cervical back: Neck supple.     Right lower leg: Edema present.     Left lower leg: Edema present.  Skin:    General: Skin is warm and dry.     Findings: No rash.  Neurological:     Mental Status: He is alert.     Cranial Nerves: No dysarthria or facial asymmetry.     Motor: No abnormal muscle tone or seizure activity.     Comments: Patient does not follow commands, eyes are open  Psychiatric:        Mood and Affect: Mood normal.     ED Results / Procedures / Treatments   Labs (all labs ordered are listed, but only abnormal results are displayed) Labs Reviewed - No data to display  EKG None  Radiology No results found.  Procedures Procedures  {Document cardiac monitor, telemetry assessment  procedure when appropriate:1}  Medications Ordered in ED Medications  lactated ringers infusion (has no administration in time range)  lactated ringers bolus 1,000 mL (has no administration in time range)    And  lactated ringers bolus 1,000 mL (has no administration in time range)    And  lactated ringers bolus 250 mL (has no administration in time range)  ceFEPIme (MAXIPIME) 2 g in sodium chloride 0.9 % 100 mL IVPB (has no administration in time range)  metroNIDAZOLE (FLAGYL) IVPB 500 mg (has no administration in time range)  vancomycin (VANCOCIN) IVPB 1000 mg/200 mL premix (has no  administration in time range)    ED Course/ Medical Decision Making/ A&P Clinical Course as of 11/11/2022 1222  Sun November 11, 2022  1220 Patient's blood pressures decreased into the 70s.  Sepsis protocol initiated.  Will start pressors as well [JK]    Clinical Course User Index [JK] Linwood Dibbles, MD   {   Click here for ABCD2, HEART and other calculatorsREFRESH Note before signing :1}                              Medical Decision Making Amount and/or Complexity of Data Reviewed Labs: ordered. Radiology: ordered. ECG/medicine tests: ordered.  Risk Prescription drug management.   ***  {Document critical care time when appropriate:1} {Document review of labs and clinical decision tools ie heart score, Chads2Vasc2 etc:1}  {Document your independent review of radiology images, and any outside records:1} {Document your discussion with family members, caretakers, and with consultants:1} {Document social determinants of health affecting pt's care:1} {Document your decision making why or why not admission, treatments were needed:1} Final Clinical Impression(s) / ED Diagnoses Final diagnoses:  None    Rx / DC Orders ED Discharge Orders     None

## 2022-11-02 NOTE — Progress Notes (Signed)
Pharmacy Antibiotic Note  Austin Davis is a 82 y.o. male for which pharmacy has been consulted for cefepime and vancomycin dosing for pneumonia.  Patient with a history of chronic respiratory failure s/p trach and PEG, HFrEF, COPD, PAF not on anticoagulation, CAD, hypothyroidism, and prior MDRO infections. Patient presenting with sepsis concerns.  SCr 0.58 WBC 4.5; LA 1.9; T 88.8; HR 82; RR 30 COVID neg / flu neg  10/5 Bcx: 1/4 Staph capitis 10/5 MRSA PCR: not detected 10/5 Ur Cx: >100K  proteus, MDR kleb pneumo   Plan: CCM is aware of MDRO recently grown and ok w/ cefepime/vanco. Would have low threshold for adjusting abx selection if urinary source suspected or not responding well. ID consultation may prove beneficial.  Cefepime 2g q8hr  Vancomycin 1250 mg given in the ED --will continue 1500 mg q24hr (eAUC 456.9) unless change in renal function Monitor WBC, fever, renal function, cultures De-escalate when able Levels at steady state F/u ID recommendations  Height: 5\' 10"  (177.8 cm) Weight: 68.6 kg (151 lb 3.8 oz) IBW/kg (Calculated) : 73  Temp (24hrs), Avg:84.1 F (28.9 C), Min:84.1 F (28.9 C), Max:84.1 F (28.9 C)  Recent Labs  Lab 10/27/22 0420 10/28/22 0416 10/29/22 0410 10/29/2022 1210 10/22/2022 1303 11/06/2022 1305  WBC 10.5 9.1 14.6*  --  4.5  --   CREATININE 0.44* 0.42* 0.44* 0.58*  --   --   LATICACIDVEN  --   --   --   --   --  1.9    Estimated Creatinine Clearance: 70.3 mL/min (A) (by C-G formula based on SCr of 0.58 mg/dL (L)).    Allergies  Allergen Reactions   Prednisone Other (See Comments)    Documented on MAR Unknown reaction   Cortisone Other (See Comments)    Documented on MAR Unknown reaction   Microbiology results: Pending  Thank you for allowing pharmacy to be a part of this patient's care.  Delmar Landau, PharmD, BCPS 11/04/2022 3:25 PM ED Clinical Pharmacist -  [phone]

## 2022-11-02 NOTE — Progress Notes (Signed)
[facility] following for sepsis protocol. 

## 2022-11-02 NOTE — ED Triage Notes (Signed)
Pt BIB [facility] from [facility] with c/o hypotension and no UOP in 24hrs. [facility] reported normal BP measurements en route. [facility] did report pt has not been tracking movement and has declined since this past Thursday.

## 2022-11-02 NOTE — ED Notes (Signed)
Pt oxygen level dropped down to 88%. Respiratory contacted and instructions where given to increase patient oxygen concentration. Increased to 60%. Oxygen level now 95%.

## 2022-11-02 NOTE — Progress Notes (Signed)
Pt hypothermic and warming protocols initiated

## 2022-11-02 NOTE — ED Notes (Signed)
1457 11/09/2022 1500 10/28/2022 1515 11/15/2022 1528  BP: (!) 82/61 (!) 74/62    Pulse: 67 72 69   Resp: (!) 30 (!) 30 (!) 30   Temp:    (S) (!) 88.8 F (31.6 C)  TempSrc:    Rectal  SpO2: 94% 93% 94%   Weight:      Height:        Isolation Precautions Enteric precautions (UV disinfection)  Medications Medications  vancomycin (VANCOREADY) IVPB 1250 mg/250 mL (1,250 mg Intravenous New Bag/Given 11/07/2022 1448)  0.9 %  sodium chloride infusion (250 mLs Intravenous Not Given 10/29/2022 1444)  norepinephrine (LEVOPHED) 4mg  in (0.016 mg/mL) premix  infusion (9 mcg/min Intravenous Rate/Dose Change 11/14/2022 1535)  docusate sodium (COLACE) capsule 100 mg (has no administration in time range)  polyethylene glycol (MIRALAX / GLYCOLAX) packet 17 g (has no administration in time range)  heparin injection 5,000 Units (has no administration in time range)  famotidine (PEPCID) tablet 20 mg (has no administration in time range)  albumin human 25 % solution 25 g (has no administration in time range)  feeding supplement (PIVOT 1.5 CAL) liquid 1,000 mL (has no administration in time range)  feeding supplement (PROSource TF20) liquid 60 mL (has no administration in time range)  lactated ringers bolus 1,000 mL (0 mLs Intravenous Stopped 11/01/2022 1333)  ceFEPIme (MAXIPIME) 2 g in sodium chloride 0.9 % 100 mL IVPB (0 g Intravenous Stopped 11/05/2022 1306)  metroNIDAZOLE (FLAGYL) IVPB 500 mg (0 mg Intravenous Stopped 10/24/2022 1406)    Mobility non-ambulatory     Focused Assessments     R Recommendations: See Admitting Provider Note  Report given to:   Additional Notes:  11/06/2022 1515   11/04/2022 1537  Magnesium  (ICU Tube Feeding: PEPuP )  5A & 5P,   R (with TIMED occurrences)      10/30/2022 1536   11/16/2022 1537  Phosphorus  (ICU Tube Feeding: PEPuP )  5A & 5P,   R (with TIMED occurrences)      11/07/2022 1536   10/26/2022 1525  Procalcitonin  Once,   R       References:    Procalcitonin Lower Respiratory Tract Infection AND Sepsis Procalcitonin Algorithm   10/28/2022 1524   11/10/2022 1523  Culture, Respiratory w Gram Stain  Once,   R        11/05/2022 1522   11/08/2022 1515  CBC  (heparin)  Once,   R       Comments: Baseline for heparin therapy IF NOT ALREADY DRAWN.  Notify MD if PLT < 100 K.    11/14/2022 1515   11/07/2022 1515  Creatinine, serum  (heparin)  Once,   R       Comments: Baseline for heparin therapy IF NOT ALREADY DRAWN.    10/31/2022 1515   11/05/2022 1140  Gastrointestinal Panel by PCR , Stool   (Gastrointestinal Panel by PCR, Stool                                                                                                                                                     **Does Not include CLOSTRIDIUM DIFFICILE testing. **If CDIFF testing is needed, place order from the "C Difficile Testing" order set.**)  Once,   URGENT        10/30/2022 1139   11/04/2022 1140  C Difficile Quick Screen w PCR reflex  (C Difficile quick screen w PCR reflex panel )  Once, for 24 hours,   URGENT       References:    CDiff Information Tool   10/30/2022 1139   11/04/2022 1137  Urinalysis, w/ Reflex to Culture (Infection Suspected) -Urine, Catheterized; Indwelling urinary catheter  (Septic presentation on arrival (screening labs, nursing and treatment orders for obvious sepsis))  ONCE - URGENT,   URGENT       Question Answer Comment  Specimen Source Urine, Catheterized   Specimen Source Indwelling urinary catheter      10/30/2022 1136   10/31/2022 1136  CBC with Differential  (Septic presentation on arrival (screening labs, nursing and treatment orders for obvious sepsis))  ONCE - STAT,   STAT        11/01/2022 1136   10/21/2022 1136  Blood Culture (routine x 2)  (Septic presentation on arrival (screening labs, nursing and treatment orders for obvious sepsis))  BLOOD CULTURE X 2,   STAT      11/01/2022 1136            Vitals/Pain Today's Vitals   11/15/2022  11/06/2022 1515   11/04/2022 1537  Magnesium  (ICU Tube Feeding: PEPuP )  5A & 5P,   R (with TIMED occurrences)      10/30/2022 1536   11/16/2022 1537  Phosphorus  (ICU Tube Feeding: PEPuP )  5A & 5P,   R (with TIMED occurrences)      11/07/2022 1536   10/26/2022 1525  Procalcitonin  Once,   R       References:    Procalcitonin Lower Respiratory Tract Infection AND Sepsis Procalcitonin Algorithm   10/28/2022 1524   11/10/2022 1523  Culture, Respiratory w Gram Stain  Once,   R        11/05/2022 1522   11/08/2022 1515  CBC  (heparin)  Once,   R       Comments: Baseline for heparin therapy IF NOT ALREADY DRAWN.  Notify MD if PLT < 100 K.    11/14/2022 1515   11/07/2022 1515  Creatinine, serum  (heparin)  Once,   R       Comments: Baseline for heparin therapy IF NOT ALREADY DRAWN.    10/31/2022 1515   11/05/2022 1140  Gastrointestinal Panel by PCR , Stool   (Gastrointestinal Panel by PCR, Stool                                                                                                                                                     **Does Not include CLOSTRIDIUM DIFFICILE testing. **If CDIFF testing is needed, place order from the "C Difficile Testing" order set.**)  Once,   URGENT        10/30/2022 1139   11/04/2022 1140  C Difficile Quick Screen w PCR reflex  (C Difficile quick screen w PCR reflex panel )  Once, for 24 hours,   URGENT       References:    CDiff Information Tool   10/30/2022 1139   11/04/2022 1137  Urinalysis, w/ Reflex to Culture (Infection Suspected) -Urine, Catheterized; Indwelling urinary catheter  (Septic presentation on arrival (screening labs, nursing and treatment orders for obvious sepsis))  ONCE - URGENT,   URGENT       Question Answer Comment  Specimen Source Urine, Catheterized   Specimen Source Indwelling urinary catheter      10/30/2022 1136   10/31/2022 1136  CBC with Differential  (Septic presentation on arrival (screening labs, nursing and treatment orders for obvious sepsis))  ONCE - STAT,   STAT        11/01/2022 1136   10/21/2022 1136  Blood Culture (routine x 2)  (Septic presentation on arrival (screening labs, nursing and treatment orders for obvious sepsis))  BLOOD CULTURE X 2,   STAT      11/01/2022 1136            Vitals/Pain Today's Vitals   11/15/2022  1457 11/09/2022 1500 10/28/2022 1515 11/15/2022 1528  BP: (!) 82/61 (!) 74/62    Pulse: 67 72 69   Resp: (!) 30 (!) 30 (!) 30   Temp:    (S) (!) 88.8 F (31.6 C)  TempSrc:    Rectal  SpO2: 94% 93% 94%   Weight:      Height:        Isolation Precautions Enteric precautions (UV disinfection)  Medications Medications  vancomycin (VANCOREADY) IVPB 1250 mg/250 mL (1,250 mg Intravenous New Bag/Given 11/07/2022 1448)  0.9 %  sodium chloride infusion (250 mLs Intravenous Not Given 10/29/2022 1444)  norepinephrine (LEVOPHED) 4mg  in (0.016 mg/mL) premix  infusion (9 mcg/min Intravenous Rate/Dose Change 11/14/2022 1535)  docusate sodium (COLACE) capsule 100 mg (has no administration in time range)  polyethylene glycol (MIRALAX / GLYCOLAX) packet 17 g (has no administration in time range)  heparin injection 5,000 Units (has no administration in time range)  famotidine (PEPCID) tablet 20 mg (has no administration in time range)  albumin human 25 % solution 25 g (has no administration in time range)  feeding supplement (PIVOT 1.5 CAL) liquid 1,000 mL (has no administration in time range)  feeding supplement (PROSource TF20) liquid 60 mL (has no administration in time range)  lactated ringers bolus 1,000 mL (0 mLs Intravenous Stopped 11/01/2022 1333)  ceFEPIme (MAXIPIME) 2 g in sodium chloride 0.9 % 100 mL IVPB (0 g Intravenous Stopped 11/05/2022 1306)  metroNIDAZOLE (FLAGYL) IVPB 500 mg (0 mg Intravenous Stopped 10/24/2022 1406)    Mobility non-ambulatory     Focused Assessments     R Recommendations: See Admitting Provider Note  Report given to:   Additional Notes:  1457 11/09/2022 1500 10/28/2022 1515 11/15/2022 1528  BP: (!) 82/61 (!) 74/62    Pulse: 67 72 69   Resp: (!) 30 (!) 30 (!) 30   Temp:    (S) (!) 88.8 F (31.6 C)  TempSrc:    Rectal  SpO2: 94% 93% 94%   Weight:      Height:        Isolation Precautions Enteric precautions (UV disinfection)  Medications Medications  vancomycin (VANCOREADY) IVPB 1250 mg/250 mL (1,250 mg Intravenous New Bag/Given 11/07/2022 1448)  0.9 %  sodium chloride infusion (250 mLs Intravenous Not Given 10/29/2022 1444)  norepinephrine (LEVOPHED) 4mg  in (0.016 mg/mL) premix  infusion (9 mcg/min Intravenous Rate/Dose Change 11/14/2022 1535)  docusate sodium (COLACE) capsule 100 mg (has no administration in time range)  polyethylene glycol (MIRALAX / GLYCOLAX) packet 17 g (has no administration in time range)  heparin injection 5,000 Units (has no administration in time range)  famotidine (PEPCID) tablet 20 mg (has no administration in time range)  albumin human 25 % solution 25 g (has no administration in time range)  feeding supplement (PIVOT 1.5 CAL) liquid 1,000 mL (has no administration in time range)  feeding supplement (PROSource TF20) liquid 60 mL (has no administration in time range)  lactated ringers bolus 1,000 mL (0 mLs Intravenous Stopped 11/01/2022 1333)  ceFEPIme (MAXIPIME) 2 g in sodium chloride 0.9 % 100 mL IVPB (0 g Intravenous Stopped 11/05/2022 1306)  metroNIDAZOLE (FLAGYL) IVPB 500 mg (0 mg Intravenous Stopped 10/24/2022 1406)    Mobility non-ambulatory     Focused Assessments     R Recommendations: See Admitting Provider Note  Report given to:   Additional Notes:  1457 11/09/2022 1500 10/28/2022 1515 11/15/2022 1528  BP: (!) 82/61 (!) 74/62    Pulse: 67 72 69   Resp: (!) 30 (!) 30 (!) 30   Temp:    (S) (!) 88.8 F (31.6 C)  TempSrc:    Rectal  SpO2: 94% 93% 94%   Weight:      Height:        Isolation Precautions Enteric precautions (UV disinfection)  Medications Medications  vancomycin (VANCOREADY) IVPB 1250 mg/250 mL (1,250 mg Intravenous New Bag/Given 11/07/2022 1448)  0.9 %  sodium chloride infusion (250 mLs Intravenous Not Given 10/29/2022 1444)  norepinephrine (LEVOPHED) 4mg  in (0.016 mg/mL) premix  infusion (9 mcg/min Intravenous Rate/Dose Change 11/14/2022 1535)  docusate sodium (COLACE) capsule 100 mg (has no administration in time range)  polyethylene glycol (MIRALAX / GLYCOLAX) packet 17 g (has no administration in time range)  heparin injection 5,000 Units (has no administration in time range)  famotidine (PEPCID) tablet 20 mg (has no administration in time range)  albumin human 25 % solution 25 g (has no administration in time range)  feeding supplement (PIVOT 1.5 CAL) liquid 1,000 mL (has no administration in time range)  feeding supplement (PROSource TF20) liquid 60 mL (has no administration in time range)  lactated ringers bolus 1,000 mL (0 mLs Intravenous Stopped 11/01/2022 1333)  ceFEPIme (MAXIPIME) 2 g in sodium chloride 0.9 % 100 mL IVPB (0 g Intravenous Stopped 11/05/2022 1306)  metroNIDAZOLE (FLAGYL) IVPB 500 mg (0 mg Intravenous Stopped 10/24/2022 1406)    Mobility non-ambulatory     Focused Assessments     R Recommendations: See Admitting Provider Note  Report given to:   Additional Notes:

## 2022-11-02 NOTE — H&P (Signed)
NAME:  Austin Davis, MRN:  [id number], DOB:  [birth year], LOS: 0 ADMISSION DATE:  20-Nov-2022, CONSULTATION DATE:  November 20, 2022 REFERRING MD:  Dr. 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  Of note patient was just discharged from this facility 10/9 for management of acute pulmonary edema.  And hypertension.  Patient presented to ED for this admission with concerns of recurrent sepsis and decreased urine output.  On ED admit patient was hypothermic, tachypneic, hypotensive, and mildly bradycardic.  Lab work significant for glucose 168, BUN 50, creatinine 0.58, calcium of 8.0, alkaline phosphatase 163, albumin 1.5, total protein 5.2, hemoglobin 7.5, platelets 78.  Patient was started on vasopressors.  PCCM consulted for further management and admission  Pertinent  Medical History  Chronic hypoxic respiratory failure s/p trach and PEG presenting at LTAC, HFrEF, COPD, PAF not on anticoagulation, CAD, hypothyroidism, and prior MDRO infections  Significant Hospital Events: Including procedures, antibiotic start and stop dates in addition to other pertinent events   November 20, 2022 recurrent admission from [facility] with signs of sepsis with decreased urine output  Interim History / Subjective:  As above  Objective   Blood pressure 96/70, pulse 60, temperature (!) 84.1 F (28.9 C), temperature source Rectal, resp. rate (!) 30, height 5\' 10"  (1.778 m), weight 68.6 kg, SpO2 97%.    Vent Mode: PCV FiO2 (%):  [40 %] 40 % Set Rate:  [30 bmp] 30 bmp PEEP:  [8 cmH20] 8 cmH20   Intake/Output Summary (Last 24 hours) at 11-20-2022 1448 Last data filed at 20-Nov-2022 1306 Gross per 24 hour  Intake 100  ml  Output --  Net 100 ml   Filed Weights   2022/11/20 1123  Weight: 68.6 kg    Examination: General: Acute on chronic severely deconditioned elderly male lying in bed on mechanical ventilation via tracheostomy no acute distress HEENT: Trach midline, MM pink/moist, PERRL,  Neuro: Eyes spontaneously open, unable to follow command, baseline tremor seen CV: s1s2 regular rate and rhythm, no murmur, rubs, or gallops,  PULM: Bilateral rhonchi, mild tachypnea, no increased work of breathing, tolerating ventilator GI: soft, bowel sounds active in all 4 quadrants, non-tender, non-distended Extremities: warm/dry, no edema  Skin: no rashes or lesions   Resolved Hospital Problem list     Assessment & Plan:  Acute on chronic hypoxic and hypercapnic respiratory failure, ventilator dependent via tracheostomy tube Pulmonary edema COPD Multidrug-resistant Klebsiella pneumonia P: Continue ventilator support with lung protective strategies  Wean PEEP and FiO2 for sats greater than 90%. Head of bed elevated 30 degrees. Plateau pressures less than 30 cm H20.  Follow intermittent chest x-ray and ABG.   SAT/SBT as tolerated, mentation preclude extubation  Ensure adequate pulmonary hygiene  Follow cultures  VAP bundle in place  PAD protocol Continue empiric cefepime and vancomycin Repeat sputum culture  Severe septic shock present on admission -On arrival to ED patient was seen hypothermic, tachypneic, bradycardic, hypotensive with leukopenia -Multiple infection sources in the setting of tracheostomy dependence, Foley dependence, chronic PICC line, and multiple wounds P: Empiric antibiotics as above with low threshold to discontinue Follow cultures Check procalcitonin Monitor urine output Continue pressors for MAP goal greater than 65  Acute kidney injury -Creatinine on  admission 0.58 with GFR greater than 60 but BUN is elevated at 50.  LTAC reports no urine output in the last 24  hours P: Follow renal function  Monitor urine output Trend Bmet Avoid nephrotoxins Ensure adequate renal perfusion  Scheduled IV albumin  Massive anasarca with weeping extremities Hypoalbuminemia P: Optimize nutrition as able Protein supplementation Albumin supplementation Diurese as able  Combined systolic and diastolic congestive heart failure -Echo 10/27/2022 with EF of 30 to 35%, LV demonstrates regional wall abnormality, and grade 2 diastolic dysfunction Paroxysmal atrial fibrillation not anticoagulated due to GI bleeding History of CAD -Per chart review it appears patient frequently develops severe hypervolemia with anasarca during IV resuscitation. P: Continuous telemetry Optimize hemodynamics as able Pressor support as above Optimize electrolytes GDMT as able  Acute on chronic anemia P: Trend CBC Transfuse per protocol Hemoglobin goal greater than 7  GERD P: PPI   Multiple chronic pressure wounds P: Pressure relieving devices Optimize nutrition  Severe protein calorie malnutrition P: Continue tube feeds via PEG tube 10 supplementation RD consult    Best Practice (right click and "Reselect all SmartList Selections" daily)   Diet/type: tubefeeds DVT prophylaxis: prophylactic heparin  GI prophylaxis: PPI Lines: Central line Foley:  Yes, and it is still needed Code Status:  full code Last date of multidisciplinary goals of care discussion: Pending   Labs   CBC: Recent Labs  Lab 10/27/22 0420 10/28/22 0416 10/29/22 0410 22-Nov-2022 1303  WBC 10.5 9.1 14.6* 4.5  NEUTROABS  --   --  13.4* 4.3  HGB 8.3* 7.5* 8.4* 7.5*  HCT 27.1* 24.6* 27.1* 26.3*  MCV 94.4 93.9 95.1 100.0  PLT 212 177 203 78*    Basic Metabolic Panel: Recent Labs  Lab 10/27/22 0420 10/28/22 0416 10/29/22 0410 22-Nov-2022 1210  NA 138 136 137 138  K 3.4* 2.9* 3.8 5.1  CL 98 96* 99 99  CO2 29 29 30  32  GLUCOSE 195* 148* 122* 168*  BUN 28* 21 19 50*  CREATININE 0.44*  0.42* 0.44* 0.58*  CALCIUM 7.4* 7.3* 7.5* 8.0*  MG  --  1.6* 2.3  --   PHOS  --   --  4.1  --    GFR: Estimated Creatinine Clearance: 70.3 mL/min (A) (by C-G formula based on SCr of 0.58 mg/dL (L)). Recent Labs  Lab 10/27/22 0420 10/28/22 0416 10/29/22 0410 November 22, 2022 1303 11-22-22 1305  PROCALCITON 0.10 0.20  --   --   --   WBC 10.5 9.1 14.6* 4.5  --   LATICACIDVEN  --   --   --   --  1.9    Liver Function Tests: Recent Labs  Lab 10/27/22 0420 10/29/22 0410 22-Nov-2022 1210  AST 12*  --  15  ALT 15  --  16  ALKPHOS 152*  --  163*  BILITOT 0.2*  --  0.4  PROT 5.6*  --  5.2*  ALBUMIN <1.5* 1.9* 1.5*   No results for input(s): "LIPASE", "AMYLASE" in the last 168 hours. No results for input(s): "AMMONIA" in the last 168 hours.  ABG    Component Value Date/Time   PHART 7.382 10/25/2022 0554   PCO2ART 63.9 (H) 10/25/2022 0554   PO2ART 256 (H) 10/25/2022 0554   HCO3 38.1 (H) 10/25/2022 0554   TCO2 40 (H) 10/25/2022 0554   ACIDBASEDEF 2.0 05/30/2021 0052   O2SAT 100 10/25/2022 0554     Coagulation Profile: Recent Labs  Lab 11/22/2022 1210  INR 1.1    Cardiac  Enzymes: No results for input(s): "CKTOTAL", "CKMB", "CKMBINDEX", "TROPONINI" in the last 168 hours.  HbA1C: Hgb A1c MFr Bld  Date/Time Value Ref Range Status  09/25/2022 09:10 AM 5.8 (H) 4.8 - 5.6 % Final    Comment:    (NOTE) Pre diabetes:          5.7%-6.4%  Diabetes:              >6.4%  Glycemic control for   <7.0% adults with diabetes   01/04/2022 11:22 PM 5.3 4.8 - 5.6 % Final    Comment:    (NOTE)         Prediabetes: 5.7 - 6.4         Diabetes: >6.4         Glycemic control for adults with diabetes: <7.0     CBG: Recent Labs  Lab 10/28/22 1925 10/28/22 2315 10/29/22 0322 10/29/22 0728 10/29/22 1120  GLUCAP 71 76 94 104* 90    Review of Systems:   Unable to assess   Past Medical History:  He,  has a past medical history of Aspiration pneumonia (HCC) (03/25/2021), CHF  (congestive heart failure) (HCC), Coronary artery disease, Essential tremor, GERD (gastroesophageal reflux disease), GI bleed (2019), Headache, Hypothyroidism, Low blood sugar, Mitral regurgitation, Mitral valve prolapse, Osteopenia (2021), Paroxysmal atrial fibrillation (HCC), Seizure-like activity (HCC), and Tremor.   Surgical History:   Past Surgical History:  Procedure Laterality Date   COLONOSCOPY WITH PROPOFOL N/A 02/19/2017   Procedure: COLONOSCOPY WITH PROPOFOL;  Surgeon: Charlott Rakes, MD;  Location: [facility];  Service: Endoscopy;  Laterality: N/A;   IR GASTR TUBE CONVERT GASTR-JEJ PER W/FL MOD SED  09/24/2022   IR GASTROSTOMY TUBE MOD SED  10/18/2020   IR IVC FILTER PLMT / S&I /IMG GUID/MOD SED  11/01/2020   IR REPLACE G-TUBE SIMPLE WO FLUORO  03/15/2022   IR REPLACE G-TUBE SIMPLE WO FLUORO  06/30/2022   IR REPLACE G-TUBE SIMPLE WO FLUORO  09/09/2022   IR REPLC GASTRO/COLONIC TUBE PERCUT W/FLUORO  10/28/2022   laser eye surgery for retina detachment     MITRAL VALVE REPAIR  01/2003   monitor  02/05/2006   polyp removal     TONSILLECTOMY     tooth removal     as a teenager   TRACHEOSTOMY TUBE PLACEMENT N/A 10/26/2020   Procedure: TRACHEOSTOMY;  Surgeon: Drema Halon, MD;  Location: [facility] OR;  Service: ENT;  Laterality: N/A;     Social History:   reports that he has never smoked. He has never used smokeless tobacco. He reports current alcohol use. He reports that he does not use drugs.   Family History:  His family history includes Asthma in his daughter; CAD in his mother; Epilepsy in his son; Heart disease in his mother; Heart failure in his father, maternal grandmother, and paternal grandmother; Migraines in his mother; Pneumonia in his maternal grandfather; Skin cancer in his father; Stroke in his paternal grandfather; Tremor in his father; Valvular heart disease in his father. There is no history of Parkinson's disease.   Allergies Allergies  Allergen Reactions    Prednisone Other (See Comments)    Documented on MAR Unknown reaction   Cortisone Other (See Comments)    Documented on MAR Unknown reaction     Home Medications  Prior to Admission medications   Medication Sig Start Date End Date Taking? Authorizing Provider  acetaminophen (TYLENOL) 325 MG tablet Place 650 mg into feeding tube every 8 (eight) hours as  needed for fever, headache or moderate pain.   Yes [provider]  amiodarone (PACERONE) 200 MG tablet Place 1 tablet (200 mg total) into feeding tube daily. 07/05/22  Yes Simonne Martinet, NP  budesonide (PULMICORT) 0.5 MG/2ML nebulizer solution Take 0.5 mg by nebulization every 12 (twelve) hours.   Yes [provider]  doxazosin (CARDURA) 4 MG tablet Take 6 mg by mouth daily.   Yes [provider]  folic acid (FOLVITE) 1 MG tablet Place 1 tablet (1 mg total) into feeding tube daily. 10/29/22  Yes Albustami, Flonnie Hailstone, MD  gabapentin (NEURONTIN) 100 MG capsule 200 mg every 12 (twelve) hours. Via tube   Yes [provider]  heparin 5000 UNIT/ML injection Inject 5,000 Units into the skin every 8 (eight) hours.   Yes [provider]  lansoprazole (PREVACID) 30 MG capsule Place 30 mg into feeding tube daily at 12 noon.   Yes [provider]  levalbuterol (XOPENEX) 1.25 MG/3ML nebulizer solution Take 1.25 mg by nebulization every 6 (six) hours.   Yes [provider]  levothyroxine (SYNTHROID) 25 MCG tablet Place 25 mcg into feeding tube daily before breakfast.   Yes [provider]  LORazepam (ATIVAN) 1 MG tablet Place 1 mg into feeding tube every 8 (eight) hours.   Yes [provider]  midodrine (PROAMATINE) 10 MG tablet Take 1.5 tablets (15 mg total) by mouth every 8 (eight) hours. 10/29/22  Yes Albustami, Flonnie Hailstone, MD  Multiple Vitamin (MULTIVITAMIN) tablet Place 1 tablet into feeding tube daily.   Yes [provider]  Nutritional Supplements (FEEDING SUPPLEMENT,  OSMOLITE 1.5 CAL,) LIQD Place 1,000 mLs into feeding tube continuous. 09/10/22  Yes Masters, Katie, DO  Omega-3 Fatty Acids (FISH OIL) 1000 MG CAPS Place 1,000 mg into feeding tube daily.   Yes [provider]  polyethylene glycol (MIRALAX / GLYCOLAX) 17 g packet Place 17 g into feeding tube daily.   Yes [provider]  sertraline (ZOLOFT) 100 MG tablet Place 100 mg into feeding tube daily.   Yes [provider]  sodium chloride (OCEAN) 0.65 % SOLN nasal spray Place 1 spray into both nostrils every 8 (eight) hours.   Yes [provider]     Critical care time:   CRITICAL CARE Performed by: Oluwatomisin Hustead D. Harris   Total critical care time: 40 minutes  Critical care time was exclusive of separately billable procedures and treating other patients.  Critical care was necessary to treat or prevent imminent or life-threatening deterioration.  Critical care was time spent personally by me on the following activities: development of treatment plan with patient and/or surrogate as well as nursing, discussions with consultants, evaluation of patient's response to treatment, examination of patient, obtaining history from patient or surrogate, ordering and performing treatments and interventions, ordering and review of laboratory studies, ordering and review of radiographic studies, pulse oximetry and re-evaluation of patient's condition.  Mertie Haslem D. Harris, NP-C [facility] Personal contact information can be found on Amion  If no contact or response made please call [phone] November 15, 2022, 3:35 PM

## 2022-11-02 NOTE — ED Notes (Signed)
Critical care NP at bedside, wants to hold off on IVF due to pt being in fluid overload.

## 2022-11-02 NOTE — ED Notes (Signed)
Dr. Lynelle Doctor notified of pt's hypotension and multiple unsuccessful ultrasound IV attempts. New orders placed see eMAR.

## 2022-11-02 NOTE — Progress Notes (Signed)
RT assisted with patient transport on vent to 2M07 without complications.

## 2022-11-02 NOTE — Progress Notes (Signed)
ED Pharmacy Antibiotic Sign Off An antibiotic consult was received from an ED provider for cefepime and vancomycin per pharmacy dosing for sepsis. A chart review was completed to assess appropriateness.  A single dose of cefepime and flagyl  was placed by the ED provider.   The following one time order(s) were placed per pharmacy consult:  vancomycin 1250 mg x 1 dose  Further antibiotic and/or antibiotic pharmacy consults should be ordered by the admitting provider if indicated.   Thank you for allowing pharmacy to be a part of this patient's care.   Delmar Landau, PharmD, BCPS 11/06/2022 11:59 AM ED Clinical Pharmacist -  [phone]

## 2022-11-03 ENCOUNTER — Inpatient Hospital Stay (HOSPITAL_COMMUNITY): Payer: Medicare Other

## 2022-11-03 DIAGNOSIS — N170 Acute kidney failure with tubular necrosis: Secondary | ICD-10-CM

## 2022-11-03 DIAGNOSIS — A419 Sepsis, unspecified organism: Secondary | ICD-10-CM

## 2022-11-03 DIAGNOSIS — R6521 Severe sepsis with septic shock: Secondary | ICD-10-CM

## 2022-11-03 LAB — BASIC METABOLIC PANEL
Anion gap: 10 (ref 5–15)
Anion gap: 6 (ref 5–15)
BUN: 51 mg/dL — ABNORMAL HIGH (ref 8–23)
BUN: 55 mg/dL — ABNORMAL HIGH (ref 8–23)
CO2: 29 mmol/L (ref 22–32)
CO2: 37 mmol/L — ABNORMAL HIGH (ref 22–32)
Calcium: 7.9 mg/dL — ABNORMAL LOW (ref 8.9–10.3)
Calcium: 7.7 mg/dL — ABNORMAL LOW (ref 8.9–10.3)
Chloride: 95 mmol/L — ABNORMAL LOW (ref 98–111)
Chloride: 97 mmol/L — ABNORMAL LOW (ref 98–111)
Creatinine, Ser: 0.77 mg/dL (ref 0.61–1.24)
Creatinine, Ser: 0.6 mg/dL — ABNORMAL LOW (ref 0.61–1.24)
GFR, Estimated: >60 mL/min (ref >60)
GFR, Estimated: >60 mL/min (ref >60)
Glucose, Bld: 291 mg/dL — ABNORMAL HIGH (ref 70–99)
Glucose, Bld: 54 mg/dL — ABNORMAL LOW (ref 70–99)
Potassium: 5.6 mmol/L — ABNORMAL HIGH (ref 3.5–5.1)
Potassium: 5.8 mmol/L — ABNORMAL HIGH (ref 3.5–5.1)
Sodium: 134 mmol/L — ABNORMAL LOW (ref 135–145)
Sodium: 140 mmol/L (ref 135–145)

## 2022-11-03 LAB — BLOOD GAS, ARTERIAL
Acid-Base Excess: 1.5 mmol/L (ref 0.0–2.0)
Bicarbonate: 35.3 mmol/L — ABNORMAL HIGH (ref 20.0–28.0)
O2 Saturation: 100 %
Patient temperature: 36.3
pCO2 arterial: 115 mm[Hg] (ref 32–48)
pH, Arterial: 7.09 — CL (ref 7.35–7.45)
pO2, Arterial: 131 mm[Hg] — ABNORMAL HIGH (ref 83–108)

## 2022-11-03 LAB — TROPONIN I (HIGH SENSITIVITY)
Troponin I (High Sensitivity): 6 ng/L (ref <18)
Troponin I (High Sensitivity): 11 ng/L (ref <18)

## 2022-11-03 LAB — POCT I-STAT 7, (LYTES, BLD GAS, ICA,H+H)
Acid-Base Excess: 0 mmol/L (ref 0.0–2.0)
Bicarbonate: 31.3 mmol/L — ABNORMAL HIGH (ref 20.0–28.0)
Calcium, Ion: 1.22 mmol/L (ref 1.15–1.40)
HCT: 27 % — ABNORMAL LOW (ref 39.0–52.0)
Hemoglobin: 9.2 g/dL — ABNORMAL LOW (ref 13.0–17.0)
O2 Saturation: 86 %
Patient temperature: 96.2
Potassium: 5.6 mmol/L — ABNORMAL HIGH (ref 3.5–5.1)
Sodium: 136 mmol/L (ref 135–145)
TCO2: 35 mmol/L — ABNORMAL HIGH (ref 22–32)
pCO2 arterial: 102.3 mm[Hg] (ref 32–48)
pH, Arterial: 7.086 — CL (ref 7.35–7.45)
pO2, Arterial: 69 mm[Hg] — ABNORMAL LOW (ref 83–108)

## 2022-11-03 LAB — GLUCOSE, CAPILLARY
Glucose-Capillary: 75 mg/dL (ref 70–99)
Glucose-Capillary: 78 mg/dL (ref 70–99)
Glucose-Capillary: 86 mg/dL (ref 70–99)

## 2022-11-03 LAB — CBC
HCT: 26.1 % — ABNORMAL LOW (ref 39.0–52.0)
Hemoglobin: 7.3 g/dL — ABNORMAL LOW (ref 13.0–17.0)
MCH: 28 pg (ref 26.0–34.0)
MCHC: 28 g/dL — ABNORMAL LOW (ref 30.0–36.0)
MCV: 100 fL (ref 80.0–100.0)
Platelets: 103 10*3/uL — ABNORMAL LOW (ref 150–400)
RBC: 2.61 MIL/uL — ABNORMAL LOW (ref 4.22–5.81)
RDW: 19.4 % — ABNORMAL HIGH (ref 11.5–15.5)
WBC: 10.8 10*3/uL — ABNORMAL HIGH (ref 4.0–10.5)
nRBC: 0.5 % — ABNORMAL HIGH (ref 0.0–0.2)

## 2022-11-03 LAB — PHOSPHORUS: Phosphorus: 2.7 mg/dL (ref 2.5–4.6)

## 2022-11-03 LAB — MAGNESIUM
Magnesium: 2 mg/dL (ref 1.7–2.4)
Magnesium: 2.1 mg/dL (ref 1.7–2.4)

## 2022-11-03 LAB — LACTIC ACID, PLASMA: Lactic Acid, Venous: 1.2 mmol/L (ref 0.5–1.9)

## 2022-11-03 MED ORDER — CHLORHEXIDINE GLUCONATE CLOTH 2 % EX PADS
6.0000 | MEDICATED_PAD | Freq: Every day | CUTANEOUS | Status: DC
  Administered 2022-11-03: 6 via TOPICAL

## 2022-11-03 MED ORDER — ONDANSETRON HCL 4 MG/2ML IJ SOLN: 4.0000 mg | Freq: Four times a day (QID) | INTRAMUSCULAR | Status: DC | PRN

## 2022-11-03 MED ORDER — SODIUM BICARBONATE 8.4 % IV SOLN
100.0000 meq | Freq: Once | INTRAVENOUS | Status: AC
  Filled 2022-11-03: qty 50

## 2022-11-03 MED ORDER — PHENYLEPHRINE 80 MCG/ML (10ML) SYRINGE FOR IV PUSH (FOR BLOOD PRESSURE SUPPORT)
80.0000 ug | PREFILLED_SYRINGE | Freq: Once | INTRAVENOUS | Status: AC
  Administered 2022-11-03: 80 ug via INTRAVENOUS
  Filled 2022-11-03: qty 10

## 2022-11-03 MED ORDER — SODIUM BICARBONATE 8.4 % IV SOLN
INTRAVENOUS | Status: AC
  Administered 2022-11-03: 100 meq via INTRAVENOUS
  Filled 2022-11-03: qty 50

## 2022-11-03 MED ORDER — ACETAMINOPHEN 325 MG PO TABS: 650.0000 mg | ORAL_TABLET | Freq: Four times a day (QID) | ORAL | Status: DC | PRN

## 2022-11-03 MED ORDER — LORAZEPAM 2 MG/ML IJ SOLN: 2.0000 mg | INTRAMUSCULAR | Status: DC | PRN

## 2022-11-03 MED ORDER — ACETAMINOPHEN 650 MG RE SUPP: 650.0000 mg | Freq: Four times a day (QID) | RECTAL | Status: DC | PRN

## 2022-11-03 MED ORDER — DEXTROSE 50 % IV SOLN
12.5000 g | Freq: Once | INTRAVENOUS | Status: AC
  Administered 2022-11-03: 12.5 g via INTRAVENOUS
  Filled 2022-11-03: qty 50

## 2022-11-03 MED ORDER — GLYCOPYRROLATE 0.2 MG/ML IJ SOLN: 0.2000 mg | INTRAMUSCULAR | Status: DC | PRN

## 2022-11-03 MED ORDER — DOBUTAMINE-DEXTROSE 4-5 MG/ML-% IV SOLN
2.5000 ug/kg/min | INTRAVENOUS | Status: DC
  Administered 2022-11-03: 2.5 ug/kg/min via INTRAVENOUS
  Filled 2022-11-03: qty 250

## 2022-11-03 MED ORDER — POLYVINYL ALCOHOL 1.4 % OP SOLN: 1.0000 [drp] | Freq: Four times a day (QID) | OPHTHALMIC | Status: DC | PRN

## 2022-11-03 MED ORDER — DEXTROSE 50 % IV SOLN
12.5000 g | INTRAVENOUS | Status: AC
  Administered 2022-11-03: 12.5 g via INTRAVENOUS

## 2022-11-03 MED ORDER — GLYCOPYRROLATE 1 MG PO TABS: 1.0000 mg | ORAL_TABLET | ORAL | Status: DC | PRN

## 2022-11-03 MED ORDER — MORPHINE BOLUS VIA INFUSION: 5.0000 mg | INTRAVENOUS | Status: DC | PRN

## 2022-11-03 MED ORDER — EPINEPHRINE HCL 5 MG/250ML IV SOLN IN NS
0.5000 ug/min | INTRAVENOUS | Status: DC
  Administered 2022-11-03: 0.5 ug/min via INTRAVENOUS
  Administered 2022-11-03: 20 ug/min via INTRAVENOUS
  Filled 2022-11-03 (×2): qty 250

## 2022-11-03 MED ORDER — MORPHINE 100MG IN NS 100ML (1MG/ML) PREMIX INFUSION
0.0000 mg/h | INTRAVENOUS | Status: DC
  Administered 2022-11-03: 5 mg/h via INTRAVENOUS
  Filled 2022-11-03: qty 100

## 2022-11-03 MED ORDER — SODIUM BICARBONATE 8.4 % IV SOLN
INTRAVENOUS | Status: DC
  Filled 2022-11-03 (×2): qty 1000

## 2022-11-03 MED ORDER — ONDANSETRON 4 MG PO TBDP: 4.0000 mg | ORAL_TABLET | Freq: Four times a day (QID) | ORAL | Status: DC | PRN

## 2022-11-03 MED ORDER — SODIUM BICARBONATE 8.4 % IV SOLN
100.0000 meq | Freq: Once | INTRAVENOUS | Status: AC
  Administered 2022-11-03: 100 meq via INTRAVENOUS
  Filled 2022-11-03: qty 100

## 2022-11-03 MED ORDER — INSULIN ASPART 100 UNIT/ML IV SOLN
5.0000 [IU] | Freq: Once | INTRAVENOUS | Status: AC
  Administered 2022-11-03: 5 [IU] via INTRAVENOUS

## 2022-11-04 DIAGNOSIS — Z515 Encounter for palliative care: Secondary | ICD-10-CM

## 2022-11-07 LAB — CULTURE, BLOOD (ROUTINE X 2): Culture: NO GROWTH

## 2022-11-08 LAB — CULTURE, BLOOD (ROUTINE X 2)
Culture: NO GROWTH
Special Requests: ADEQUATE

## 2022-11-21 NOTE — Progress Notes (Addendum)
eLink Physician-Brief Progress Note Patient Name: Austin Davis DOB: 1940-05-18 MRN: [id number]   Date of Service  11/08/2022  HPI/Events of Note  Red alert for abnormal EKG, hypotensive.   Camera eval done. MAP < 65. HR ok, sinus tachy, no wide complex at this time. Maxed out on levophed/vaso   Data reviewed  82 yr old male from [facility] earlier for Sepsis:  Now  hypotensive maxed on levo/vaso. EKG reviewed. Baseline artifact, ST elevation, non specific, earlier EKG: VICD rhythm.  Trach to vent. Stable. Sats ok.    Stat repeat EKG,  ABG, labs, trop, LA started on dobutamine- for low EF CHF/PNA/sepsis/wounds.  eICU Interventions  Notified CCM Dr Carmin Muskrat to stop at bed side . Full code. Trach status.      Intervention Category Major Interventions: Sepsis - evaluation and management;Hypotension - evaluation and management  Ranee Gosselin 11/01/2022, 12:12 AM  01:02 Follow up Camera eval: MAP elevated now, getting albumin drip. Not on dobutamine yet.  EKG reviewed- non specific ST changes inferiorly. Not suspecting STEMI. Follow troponin levels.   01:57 Potassium at 5.6, cr normal - insuline+dextrose ordered once. Follow potasium in AM. Mag normal - trop at 6, no STEMI. Follow up trop in AM.  Now on dobutamine just started at 2.5  On epi started by AP/NP earlier. On 3 pressors. Full code.   2:33 AM ABG: worsening type 2 resp failure, pco2 115. Camera: On trach, not on sedation. Maxed on PCV, getting VT 11 lit. Discussed with RT.  Trial of PRVC 450/09/19/58%, Ve > 12 to 14 lit - follow ABG. On nebs. P peak 40.  04:18  In MOF. Maxed out on pressors, type 2 failure, acidosis, AKI.  Discussed with Legal guardian, Austin Davis. As per her, he is DNR. Discussed with RN. To call her so that she can e mail code status-DNR for the file .   05:38 AM peak pressures in 40's      resp 32 with set vent rate of 30. Cxr no pneumothorax. Switch back to PCV from [facility]  like before.

## 2022-11-21 NOTE — Progress Notes (Signed)
Nutrition Brief Note  Chart reviewed. Pt now transitioning to comfort care.  No further nutrition interventions planned at this time.  Please re-consult RD as needed.   Letta Median, MS, RD, LDN, CNSC Pager number available on Amion

## 2022-11-21 NOTE — IPAL (Signed)
Interdisciplinary Goals of Care Family Meeting   Date carried out: 2022/11/11  Location of the meeting:  bedside and phone conference  Member's involved: Physician, Bedside Registered Nurse, Family Member or next of kin, and Other: legal gurdian  Durable Power of Attorney or acting medical decision maker: Legal guardian - Chartered loss adjuster    Discussion: We discussed goals of care for [employer] .  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  Actively dying.  pH low and cannot ventilate.  Changes made to ventilator to try to better ventilate but still remains significantly hypotensive.  He is actively dying.  Discussed with legal guardian as well as daughter at bedside.  Recommend comfort measures.  Legal guardian agrees.  Agrees to this change.  Updated daughter who understands and agrees.  Comfort measure orders placed.  Suspect he will die on full support during this process given low blood pressure but if not certainly will be ventilated and vasopressor support once he is comfortable on analgesic drip.  Code status:   Code Status: Do not attempt resuscitation (DNR) - Comfort care   Disposition: In-patient comfort care  Time spent for the meeting: 10 minutes    Karren Burly, MD  11-11-2022, 10:45 AM

## 2022-11-21 NOTE — Progress Notes (Signed)
Went to bedside.  STatuys unchanged Patient in refractory shock Acdemia +  Plan  - bicarb amp x 2 - start bic gtt  - code status - d/w RN Charleston Ropes - patient is prior DNR confirmed now by the guardian and in interim eMD has intiated DNR ordder - goals: daughter on way and wishes "Cofmort". Paitnt currntly comofrtable + full active care -> is DNR if passes  - conversations on withdrwaal can be had with guardian +-/- family > 7am     SIGNATURE    Dr. Kalman Shan, M.D., F.C.C.P,  Pulmonary and Critical Care Medicine Staff Physician, [facility] Director - Interstitial Lung Disease  Program  Pulmonary Fibrosis [facility] at [facility] [place], [place], [address]   Pager: [phone], If no answer  -> Check AMION or Try [phone] Telephone (clinical office): [phone] Telephone (research): [phone]  5:08 AM 10/22/2022

## 2022-11-21 NOTE — Progress Notes (Signed)
Patient asystole on monitor. Heart and lung sounds auscultated, no sounds noted. Patient TOD pronounced at 1600 by Julius Bowels, RN and Maureen Ralphs, RN. Patients daughter and son at bedside. Patient legal guardian and Dr. Judeth Horn notified. No patient belongings present at bedside.

## 2022-11-21 NOTE — Progress Notes (Signed)
RT obtained ABG @0810  and reported results to MD, MD accepted results with brackets. RT is unable to send sample to lab due to unsuccessful attempts at obtaining patients temp.  ABG results: 6.99/>110/81 RT increased PC from 30 to 32 and decreased PEEP from 8 to 5 per MD. RT will continue to monitor.

## 2022-11-21 NOTE — Death Summary Note (Signed)
Carba Resistance KPC Gene DETECTED (A) NOT DETECTED Final    Comment: CRITICAL RESULT CALLED TO, READ BACK BY AND VERIFIED WITH: ICP DAVID HOLT  [id number] AT 1015 BY CM    Carba Resistance OXA48 Gene NOT DETECTED NOT DETECTED Final    Comment: (NOTE) Cepheid Carba-R is an FDA-cleared nucleic acid amplification test  (NAAT)for the detection and differentiation of genes encoding the  most prevalent carbapenemases in bacterial isolate samples. Carbapenemase gene identification and implementation of comprehensive  infection control measures are recommended by the CDC to prevent the  spread of the resistant organisms. Performed at [facility], [address], [place], [place] [address]   Culture, blood (Routine X 2) w Reflex to ID Panel     Status: None   Collection Time: 10/27/22  9:21 PM   Specimen: BLOOD LEFT HAND  Result Value Ref Range Status   Specimen Description BLOOD LEFT HAND  Final   Special Requests   Final    BOTTLES DRAWN AEROBIC AND ANAEROBIC Blood Culture adequate volume   Culture   Final    NO GROWTH 5 DAYS Performed at [facility], [address] [place], [place] [address]    Report Status 11/01/2022 FINAL  Final  Culture, blood (Routine X 2) w Reflex to ID Panel     Status: None   Collection Time: 10/27/22  9:24 PM   Specimen: BLOOD LEFT ARM  Result Value Ref Range Status   Specimen Description BLOOD LEFT ARM  Final   Special Requests   Final    BOTTLES DRAWN AEROBIC AND ANAEROBIC Blood Culture results may not be optimal due to an excessive volume of blood received in culture bottles   Culture   Final    NO GROWTH 5 DAYS Performed at [facility], [address] [place], [place] [address]    Report Status 11/01/2022 FINAL  Final  Resp panel by RT-PCR (RSV, Flu A&B, Covid) Anterior Nasal Swab     Status: None   Collection Time: 11/16/2022 11:36 AM   Specimen: Anterior Nasal Swab  Result Value Ref Range Status   SARS Coronavirus 2 by RT PCR NEGATIVE NEGATIVE Final   Influenza A by PCR NEGATIVE NEGATIVE Final   Influenza B by PCR NEGATIVE NEGATIVE Final    Comment: (NOTE) The Xpert  Xpress SARS-CoV-2/FLU/RSV plus assay is intended as an aid in the diagnosis of influenza from Nasopharyngeal swab specimens and should not be used as a sole basis for treatment. Nasal washings and aspirates are unacceptable for Xpert Xpress SARS-CoV-2/FLU/RSV testing.  Fact Sheet for Patients: BloggerCourse.com  Fact Sheet for Healthcare Providers: SeriousBroker.it  This test is not yet approved or cleared by the [place] FDA and has been authorized for detection and/or diagnosis of SARS-CoV-2 by FDA under an Emergency Use Authorization (EUA). This EUA will remain in effect (meaning this test can be used) for the duration of the COVID-19 declaration under Section 564(b)(1) of the Act, 21 U.S.C. section 360bbb-3(b)(1), unless the authorization is terminated or revoked.     Resp Syncytial Virus by PCR NEGATIVE NEGATIVE Final    Comment: (NOTE) Fact Sheet for Patients: BloggerCourse.com  Fact Sheet for Healthcare Providers: SeriousBroker.it  This test is not yet approved or cleared by the [place] FDA and has been authorized for detection and/or diagnosis of SARS-CoV-2 by FDA under an Emergency Use Authorization (EUA). This EUA will remain in effect (meaning this test can be used) for the duration  Carba Resistance KPC Gene DETECTED (A) NOT DETECTED Final    Comment: CRITICAL RESULT CALLED TO, READ BACK BY AND VERIFIED WITH: ICP DAVID HOLT  [id number] AT 1015 BY CM    Carba Resistance OXA48 Gene NOT DETECTED NOT DETECTED Final    Comment: (NOTE) Cepheid Carba-R is an FDA-cleared nucleic acid amplification test  (NAAT)for the detection and differentiation of genes encoding the  most prevalent carbapenemases in bacterial isolate samples. Carbapenemase gene identification and implementation of comprehensive  infection control measures are recommended by the CDC to prevent the  spread of the resistant organisms. Performed at [facility], [address], [place], [place] [address]   Culture, blood (Routine X 2) w Reflex to ID Panel     Status: None   Collection Time: 10/27/22  9:21 PM   Specimen: BLOOD LEFT HAND  Result Value Ref Range Status   Specimen Description BLOOD LEFT HAND  Final   Special Requests   Final    BOTTLES DRAWN AEROBIC AND ANAEROBIC Blood Culture adequate volume   Culture   Final    NO GROWTH 5 DAYS Performed at [facility], [address] [place], [place] [address]    Report Status 11/01/2022 FINAL  Final  Culture, blood (Routine X 2) w Reflex to ID Panel     Status: None   Collection Time: 10/27/22  9:24 PM   Specimen: BLOOD LEFT ARM  Result Value Ref Range Status   Specimen Description BLOOD LEFT ARM  Final   Special Requests   Final    BOTTLES DRAWN AEROBIC AND ANAEROBIC Blood Culture results may not be optimal due to an excessive volume of blood received in culture bottles   Culture   Final    NO GROWTH 5 DAYS Performed at [facility], [address] [place], [place] [address]    Report Status 11/01/2022 FINAL  Final  Resp panel by RT-PCR (RSV, Flu A&B, Covid) Anterior Nasal Swab     Status: None   Collection Time: 11/16/2022 11:36 AM   Specimen: Anterior Nasal Swab  Result Value Ref Range Status   SARS Coronavirus 2 by RT PCR NEGATIVE NEGATIVE Final   Influenza A by PCR NEGATIVE NEGATIVE Final   Influenza B by PCR NEGATIVE NEGATIVE Final    Comment: (NOTE) The Xpert  Xpress SARS-CoV-2/FLU/RSV plus assay is intended as an aid in the diagnosis of influenza from Nasopharyngeal swab specimens and should not be used as a sole basis for treatment. Nasal washings and aspirates are unacceptable for Xpert Xpress SARS-CoV-2/FLU/RSV testing.  Fact Sheet for Patients: BloggerCourse.com  Fact Sheet for Healthcare Providers: SeriousBroker.it  This test is not yet approved or cleared by the [place] FDA and has been authorized for detection and/or diagnosis of SARS-CoV-2 by FDA under an Emergency Use Authorization (EUA). This EUA will remain in effect (meaning this test can be used) for the duration of the COVID-19 declaration under Section 564(b)(1) of the Act, 21 U.S.C. section 360bbb-3(b)(1), unless the authorization is terminated or revoked.     Resp Syncytial Virus by PCR NEGATIVE NEGATIVE Final    Comment: (NOTE) Fact Sheet for Patients: BloggerCourse.com  Fact Sheet for Healthcare Providers: SeriousBroker.it  This test is not yet approved or cleared by the [place] FDA and has been authorized for detection and/or diagnosis of SARS-CoV-2 by FDA under an Emergency Use Authorization (EUA). This EUA will remain in effect (meaning this test can be used) for the duration  of the COVID-19 declaration under Section 564(b)(1) of the Act, 21 U.S.C. section 360bbb-3(b)(1), unless the authorization is terminated or revoked.  Performed at [facility], [address] [place], [place] [address]   Blood Culture (routine x 2)     Status: None (Preliminary result)   Collection Time: 10/27/2022 11:36 AM   Specimen: BLOOD  Result Value Ref Range Status   Specimen Description BLOOD SITE NOT SPECIFIED  Final   Special Requests   Final    BOTTLES DRAWN AEROBIC AND ANAEROBIC Blood Culture results may not be optimal due to an excessive volume of blood  received in culture bottles   Culture   Final    NO GROWTH 2 DAYS Performed at [facility], [address], [place], [place] [address]    Report Status PENDING  Incomplete  Blood Culture (routine x 2)     Status: None (Preliminary result)   Collection Time: 10/21/2022  5:40 PM   Specimen: BLOOD  Result Value Ref Range Status   Specimen Description BLOOD SITE NOT SPECIFIED  Final   Special Requests   Final    BOTTLES DRAWN AEROBIC AND ANAEROBIC Blood Culture adequate volume   Culture   Final    NO GROWTH 2 DAYS Performed at [facility], [address] [place], [place] [address]    Report Status PENDING  Incomplete    Lab Basic Metabolic Panel: Recent Labs  Lab 10/29/22 0410 10/29/2022 1210 11/15/2022 1740 11/17/2022 0011 10/21/2022 0351 10/30/2022 0615  NA 137 138  --  134* 136 140  K 3.8 5.1  --  5.6* 5.6* 5.8*  CL 99 99  --  95*  --  97*  CO2 30 32  --  29  --  37*  GLUCOSE 122* 168*  --  291*  --  54*  BUN 19 50*  --  51*  --  55*  CREATININE 0.44* 0.58* 0.53* 0.77  --  0.60*  CALCIUM 7.5* 8.0*  --  7.9*  --  7.7*  MG 2.3  --  2.2 2.0  --  2.1  PHOS 4.1  --  2.4*  --   --  2.7   Liver Function Tests: Recent Labs  Lab 10/29/22 0410 11/05/2022 1210  AST  --  15  ALT  --  16  ALKPHOS  --  163*  BILITOT  --  0.4  PROT  --  5.2*  ALBUMIN 1.9* 1.5*   No results for input(s): "LIPASE", "AMYLASE" in the last 168 hours. No results for input(s): "AMMONIA" in the last 168 hours. CBC: Recent Labs  Lab 10/29/22 0410 10/26/2022 1303 11/18/2022 1740 11/08/2022 0351 11/16/2022 0615  WBC 14.6* 4.5 4.3  --  10.8*  NEUTROABS 13.4* 4.3  --   --   --   HGB 8.4* 7.5* 8.3* 9.2* 7.3*  HCT 27.1* 26.3* 29.3* 27.0* 26.1*  MCV 95.1 100.0 101.7*  --  100.0  PLT 203 78* 99*  --  103*   Cardiac Enzymes: No results for input(s): "CKTOTAL", "CKMB", "CKMBINDEX", "TROPONINI" in the last 168 hours. Sepsis Labs: Recent Labs  Lab 10/29/22 0410 11/13/2022 1303 11/19/2022 1305  11/17/2022 1740 11/06/2022 0612 11/15/2022 0615  PROCALCITON  --   --   --  0.20  --   --   WBC 14.6* 4.5  --  4.3  --  10.8*  LATICACIDVEN  --   --  1.9  --  1.2  --  Carba Resistance KPC Gene DETECTED (A) NOT DETECTED Final    Comment: CRITICAL RESULT CALLED TO, READ BACK BY AND VERIFIED WITH: ICP DAVID HOLT  [id number] AT 1015 BY CM    Carba Resistance OXA48 Gene NOT DETECTED NOT DETECTED Final    Comment: (NOTE) Cepheid Carba-R is an FDA-cleared nucleic acid amplification test  (NAAT)for the detection and differentiation of genes encoding the  most prevalent carbapenemases in bacterial isolate samples. Carbapenemase gene identification and implementation of comprehensive  infection control measures are recommended by the CDC to prevent the  spread of the resistant organisms. Performed at [facility], [address], [place], [place] [address]   Culture, blood (Routine X 2) w Reflex to ID Panel     Status: None   Collection Time: 10/27/22  9:21 PM   Specimen: BLOOD LEFT HAND  Result Value Ref Range Status   Specimen Description BLOOD LEFT HAND  Final   Special Requests   Final    BOTTLES DRAWN AEROBIC AND ANAEROBIC Blood Culture adequate volume   Culture   Final    NO GROWTH 5 DAYS Performed at [facility], [address] [place], [place] [address]    Report Status 11/01/2022 FINAL  Final  Culture, blood (Routine X 2) w Reflex to ID Panel     Status: None   Collection Time: 10/27/22  9:24 PM   Specimen: BLOOD LEFT ARM  Result Value Ref Range Status   Specimen Description BLOOD LEFT ARM  Final   Special Requests   Final    BOTTLES DRAWN AEROBIC AND ANAEROBIC Blood Culture results may not be optimal due to an excessive volume of blood received in culture bottles   Culture   Final    NO GROWTH 5 DAYS Performed at [facility], [address] [place], [place] [address]    Report Status 11/01/2022 FINAL  Final  Resp panel by RT-PCR (RSV, Flu A&B, Covid) Anterior Nasal Swab     Status: None   Collection Time: 11/16/2022 11:36 AM   Specimen: Anterior Nasal Swab  Result Value Ref Range Status   SARS Coronavirus 2 by RT PCR NEGATIVE NEGATIVE Final   Influenza A by PCR NEGATIVE NEGATIVE Final   Influenza B by PCR NEGATIVE NEGATIVE Final    Comment: (NOTE) The Xpert  Xpress SARS-CoV-2/FLU/RSV plus assay is intended as an aid in the diagnosis of influenza from Nasopharyngeal swab specimens and should not be used as a sole basis for treatment. Nasal washings and aspirates are unacceptable for Xpert Xpress SARS-CoV-2/FLU/RSV testing.  Fact Sheet for Patients: BloggerCourse.com  Fact Sheet for Healthcare Providers: SeriousBroker.it  This test is not yet approved or cleared by the [place] FDA and has been authorized for detection and/or diagnosis of SARS-CoV-2 by FDA under an Emergency Use Authorization (EUA). This EUA will remain in effect (meaning this test can be used) for the duration of the COVID-19 declaration under Section 564(b)(1) of the Act, 21 U.S.C. section 360bbb-3(b)(1), unless the authorization is terminated or revoked.     Resp Syncytial Virus by PCR NEGATIVE NEGATIVE Final    Comment: (NOTE) Fact Sheet for Patients: BloggerCourse.com  Fact Sheet for Healthcare Providers: SeriousBroker.it  This test is not yet approved or cleared by the [place] FDA and has been authorized for detection and/or diagnosis of SARS-CoV-2 by FDA under an Emergency Use Authorization (EUA). This EUA will remain in effect (meaning this test can be used) for the duration  of the COVID-19 declaration under Section 564(b)(1) of the Act, 21 U.S.C. section 360bbb-3(b)(1), unless the authorization is terminated or revoked.  Performed at [facility], [address] [place], [place] [address]   Blood Culture (routine x 2)     Status: None (Preliminary result)   Collection Time: 10/27/2022 11:36 AM   Specimen: BLOOD  Result Value Ref Range Status   Specimen Description BLOOD SITE NOT SPECIFIED  Final   Special Requests   Final    BOTTLES DRAWN AEROBIC AND ANAEROBIC Blood Culture results may not be optimal due to an excessive volume of blood  received in culture bottles   Culture   Final    NO GROWTH 2 DAYS Performed at [facility], [address], [place], [place] [address]    Report Status PENDING  Incomplete  Blood Culture (routine x 2)     Status: None (Preliminary result)   Collection Time: 10/21/2022  5:40 PM   Specimen: BLOOD  Result Value Ref Range Status   Specimen Description BLOOD SITE NOT SPECIFIED  Final   Special Requests   Final    BOTTLES DRAWN AEROBIC AND ANAEROBIC Blood Culture adequate volume   Culture   Final    NO GROWTH 2 DAYS Performed at [facility], [address] [place], [place] [address]    Report Status PENDING  Incomplete    Lab Basic Metabolic Panel: Recent Labs  Lab 10/29/22 0410 10/29/2022 1210 11/15/2022 1740 11/17/2022 0011 10/21/2022 0351 10/30/2022 0615  NA 137 138  --  134* 136 140  K 3.8 5.1  --  5.6* 5.6* 5.8*  CL 99 99  --  95*  --  97*  CO2 30 32  --  29  --  37*  GLUCOSE 122* 168*  --  291*  --  54*  BUN 19 50*  --  51*  --  55*  CREATININE 0.44* 0.58* 0.53* 0.77  --  0.60*  CALCIUM 7.5* 8.0*  --  7.9*  --  7.7*  MG 2.3  --  2.2 2.0  --  2.1  PHOS 4.1  --  2.4*  --   --  2.7   Liver Function Tests: Recent Labs  Lab 10/29/22 0410 11/05/2022 1210  AST  --  15  ALT  --  16  ALKPHOS  --  163*  BILITOT  --  0.4  PROT  --  5.2*  ALBUMIN 1.9* 1.5*   No results for input(s): "LIPASE", "AMYLASE" in the last 168 hours. No results for input(s): "AMMONIA" in the last 168 hours. CBC: Recent Labs  Lab 10/29/22 0410 10/26/2022 1303 11/18/2022 1740 11/08/2022 0351 11/16/2022 0615  WBC 14.6* 4.5 4.3  --  10.8*  NEUTROABS 13.4* 4.3  --   --   --   HGB 8.4* 7.5* 8.3* 9.2* 7.3*  HCT 27.1* 26.3* 29.3* 27.0* 26.1*  MCV 95.1 100.0 101.7*  --  100.0  PLT 203 78* 99*  --  103*   Cardiac Enzymes: No results for input(s): "CKTOTAL", "CKMB", "CKMBINDEX", "TROPONINI" in the last 168 hours. Sepsis Labs: Recent Labs  Lab 10/29/22 0410 11/13/2022 1303 11/19/2022 1305  11/17/2022 1740 11/06/2022 0612 11/15/2022 0615  PROCALCITON  --   --   --  0.20  --   --   WBC 14.6* 4.5  --  4.3  --  10.8*  LATICACIDVEN  --   --  1.9  --  1.2  --  Carba Resistance KPC Gene DETECTED (A) NOT DETECTED Final    Comment: CRITICAL RESULT CALLED TO, READ BACK BY AND VERIFIED WITH: ICP DAVID HOLT  [id number] AT 1015 BY CM    Carba Resistance OXA48 Gene NOT DETECTED NOT DETECTED Final    Comment: (NOTE) Cepheid Carba-R is an FDA-cleared nucleic acid amplification test  (NAAT)for the detection and differentiation of genes encoding the  most prevalent carbapenemases in bacterial isolate samples. Carbapenemase gene identification and implementation of comprehensive  infection control measures are recommended by the CDC to prevent the  spread of the resistant organisms. Performed at [facility], [address], [place], [place] [address]   Culture, blood (Routine X 2) w Reflex to ID Panel     Status: None   Collection Time: 10/27/22  9:21 PM   Specimen: BLOOD LEFT HAND  Result Value Ref Range Status   Specimen Description BLOOD LEFT HAND  Final   Special Requests   Final    BOTTLES DRAWN AEROBIC AND ANAEROBIC Blood Culture adequate volume   Culture   Final    NO GROWTH 5 DAYS Performed at [facility], [address] [place], [place] [address]    Report Status 11/01/2022 FINAL  Final  Culture, blood (Routine X 2) w Reflex to ID Panel     Status: None   Collection Time: 10/27/22  9:24 PM   Specimen: BLOOD LEFT ARM  Result Value Ref Range Status   Specimen Description BLOOD LEFT ARM  Final   Special Requests   Final    BOTTLES DRAWN AEROBIC AND ANAEROBIC Blood Culture results may not be optimal due to an excessive volume of blood received in culture bottles   Culture   Final    NO GROWTH 5 DAYS Performed at [facility], [address] [place], [place] [address]    Report Status 11/01/2022 FINAL  Final  Resp panel by RT-PCR (RSV, Flu A&B, Covid) Anterior Nasal Swab     Status: None   Collection Time: 11/16/2022 11:36 AM   Specimen: Anterior Nasal Swab  Result Value Ref Range Status   SARS Coronavirus 2 by RT PCR NEGATIVE NEGATIVE Final   Influenza A by PCR NEGATIVE NEGATIVE Final   Influenza B by PCR NEGATIVE NEGATIVE Final    Comment: (NOTE) The Xpert  Xpress SARS-CoV-2/FLU/RSV plus assay is intended as an aid in the diagnosis of influenza from Nasopharyngeal swab specimens and should not be used as a sole basis for treatment. Nasal washings and aspirates are unacceptable for Xpert Xpress SARS-CoV-2/FLU/RSV testing.  Fact Sheet for Patients: BloggerCourse.com  Fact Sheet for Healthcare Providers: SeriousBroker.it  This test is not yet approved or cleared by the [place] FDA and has been authorized for detection and/or diagnosis of SARS-CoV-2 by FDA under an Emergency Use Authorization (EUA). This EUA will remain in effect (meaning this test can be used) for the duration of the COVID-19 declaration under Section 564(b)(1) of the Act, 21 U.S.C. section 360bbb-3(b)(1), unless the authorization is terminated or revoked.     Resp Syncytial Virus by PCR NEGATIVE NEGATIVE Final    Comment: (NOTE) Fact Sheet for Patients: BloggerCourse.com  Fact Sheet for Healthcare Providers: SeriousBroker.it  This test is not yet approved or cleared by the [place] FDA and has been authorized for detection and/or diagnosis of SARS-CoV-2 by FDA under an Emergency Use Authorization (EUA). This EUA will remain in effect (meaning this test can be used) for the duration  Carba Resistance KPC Gene DETECTED (A) NOT DETECTED Final    Comment: CRITICAL RESULT CALLED TO, READ BACK BY AND VERIFIED WITH: ICP DAVID HOLT  [id number] AT 1015 BY CM    Carba Resistance OXA48 Gene NOT DETECTED NOT DETECTED Final    Comment: (NOTE) Cepheid Carba-R is an FDA-cleared nucleic acid amplification test  (NAAT)for the detection and differentiation of genes encoding the  most prevalent carbapenemases in bacterial isolate samples. Carbapenemase gene identification and implementation of comprehensive  infection control measures are recommended by the CDC to prevent the  spread of the resistant organisms. Performed at [facility], [address], [place], [place] [address]   Culture, blood (Routine X 2) w Reflex to ID Panel     Status: None   Collection Time: 10/27/22  9:21 PM   Specimen: BLOOD LEFT HAND  Result Value Ref Range Status   Specimen Description BLOOD LEFT HAND  Final   Special Requests   Final    BOTTLES DRAWN AEROBIC AND ANAEROBIC Blood Culture adequate volume   Culture   Final    NO GROWTH 5 DAYS Performed at [facility], [address] [place], [place] [address]    Report Status 11/01/2022 FINAL  Final  Culture, blood (Routine X 2) w Reflex to ID Panel     Status: None   Collection Time: 10/27/22  9:24 PM   Specimen: BLOOD LEFT ARM  Result Value Ref Range Status   Specimen Description BLOOD LEFT ARM  Final   Special Requests   Final    BOTTLES DRAWN AEROBIC AND ANAEROBIC Blood Culture results may not be optimal due to an excessive volume of blood received in culture bottles   Culture   Final    NO GROWTH 5 DAYS Performed at [facility], [address] [place], [place] [address]    Report Status 11/01/2022 FINAL  Final  Resp panel by RT-PCR (RSV, Flu A&B, Covid) Anterior Nasal Swab     Status: None   Collection Time: 11/16/2022 11:36 AM   Specimen: Anterior Nasal Swab  Result Value Ref Range Status   SARS Coronavirus 2 by RT PCR NEGATIVE NEGATIVE Final   Influenza A by PCR NEGATIVE NEGATIVE Final   Influenza B by PCR NEGATIVE NEGATIVE Final    Comment: (NOTE) The Xpert  Xpress SARS-CoV-2/FLU/RSV plus assay is intended as an aid in the diagnosis of influenza from Nasopharyngeal swab specimens and should not be used as a sole basis for treatment. Nasal washings and aspirates are unacceptable for Xpert Xpress SARS-CoV-2/FLU/RSV testing.  Fact Sheet for Patients: BloggerCourse.com  Fact Sheet for Healthcare Providers: SeriousBroker.it  This test is not yet approved or cleared by the [place] FDA and has been authorized for detection and/or diagnosis of SARS-CoV-2 by FDA under an Emergency Use Authorization (EUA). This EUA will remain in effect (meaning this test can be used) for the duration of the COVID-19 declaration under Section 564(b)(1) of the Act, 21 U.S.C. section 360bbb-3(b)(1), unless the authorization is terminated or revoked.     Resp Syncytial Virus by PCR NEGATIVE NEGATIVE Final    Comment: (NOTE) Fact Sheet for Patients: BloggerCourse.com  Fact Sheet for Healthcare Providers: SeriousBroker.it  This test is not yet approved or cleared by the [place] FDA and has been authorized for detection and/or diagnosis of SARS-CoV-2 by FDA under an Emergency Use Authorization (EUA). This EUA will remain in effect (meaning this test can be used) for the duration

## 2022-11-21 NOTE — Progress Notes (Signed)
This RN notified Respiratory Therapist Rachelle of ventilator alarming frequently. Vent synchrony verified, O2 sats stable at this time. Plan of care ongoing.

## 2022-11-21 NOTE — Progress Notes (Signed)
NAME:  Austin Davis, MRN:  [id number], DOB:  11-02-1940, LOS: 1 ADMISSION DATE:  10/24/2022, CONSULTATION DATE:  10/28/2022 REFERRING MD:  Dr. 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  Of note patient was just discharged from this facility 10/9 for management of acute pulmonary edema.  And hypertension.  Patient presented to ED for this admission with concerns of recurrent sepsis and decreased urine output.  On ED admit patient was hypothermic, tachypneic, hypotensive, and mildly bradycardic.  Lab work significant for glucose 168, BUN 50, creatinine 0.58, calcium of 8.0, alkaline phosphatase 163, albumin 1.5, total protein 5.2, hemoglobin 7.5, platelets 78.  Patient was started on vasopressors.  PCCM consulted for further management and admission  Pertinent  Medical History  Chronic hypoxic respiratory failure s/p trach and PEG presenting at LTAC, HFrEF, COPD, PAF not on anticoagulation, CAD, hypothyroidism, and prior MDRO infections  Significant Hospital Events: Including procedures, antibiotic start and stop dates in addition to other pertinent events   10/13 recurrent admission from [facility] with signs of sepsis with decreased urine output 10/14 actively dying with refractory metabolic and respiratory acidosis multiple pressors with hypotension  Interim History / Subjective:  Actively dying.  Discussed with legal guardian as well as daughter at bedside.  Objective   Blood pressure (!) 69/42, pulse 67, temperature (!) 97.3 F (36.3 C), resp. rate (!) 32, height 5\' 10"  (1.778 m), weight 68.6 kg, SpO2 (!) 88%.    Vent Mode: PCV FiO2 (%):  [40 %-100 %] 80 % Set Rate:  [30  bmp] 30 bmp Vt Set:  [450 mL] 450 mL PEEP:  [5 cmH20-8 cmH20] 5 cmH20 Plateau Pressure:  [31 cmH20-40 cmH20] 31 cmH20   Intake/Output Summary (Last 24 hours) at Nov 28, 2022 1431 Last data filed at Nov 28, 2022 1200 Gross per 24 hour  Intake 3090.55 ml  Output 25 ml  Net 3065.55 ml   Filed Weights   11/04/2022 1123  Weight: 68.6 kg    Examination: General: Acute on chronic severely deconditioned elderly male lying in bed on mechanical ventilation via tracheostomy HEENT: Trach midline, MM pink/moist, PERRL,  Neuro: Eyes are open, pupils fixed and blown.  Does not follow commands, no tremor CV: s1s2 regular rate and rhythm, no murmur, rubs, or gallops,  PULM: Bilateral rhonchi, mild tachypnea, no increased work of breathing, tolerating ventilator GI: soft, bowel sounds quiet, non-tender, non-distended Extremities: warm/dry, no edema  Skin: no rashes or lesions   Resolved Hospital Problem list     Assessment & Plan:  Acute on chronic hypoxic and hypercapnic respiratory failure, ventilator dependent via tracheostomy tube Pulmonary edema COPD Multidrug-resistant Klebsiella pneumonia P: Ventilator support, antibiotics See goals of care transition to comfort  Severe septic shock present on admission -On arrival to ED patient was seen hypothermic, tachypneic, bradycardic, hypotensive with leukopenia -Multiple infection sources in the setting of tracheostomy dependence, Foley dependence, chronic PICC line, and multiple wounds P: Antibiotics, vasopressors, see goals of care transition to comfort  Acute kidney injury -Creatinine on admission 0.58 with GFR greater than 60 but BUN is elevated at 50.  LTAC reports no urine output in the last 24 hours P: Discontinue scheduled IV albumin, transitioning to comfort  Severe protein calorie  malnutrition P: Stop tube feeds on multiple pressors    Best Practice (right click and "Reselect all SmartList Selections" daily)   Diet/type:  NPO DVT prophylaxis: prophylactic heparin  GI prophylaxis: PPI Lines: Central line Foley:  Yes, and it is still needed Code Status:  full code Last date of multidisciplinary goals of care discussion: See IPAL note 11-13-2022, transition to comfort care  Labs   CBC: Recent Labs  Lab 10/28/22 0416 10/29/22 0410 11/07/2022 1303 11/14/2022 1740 11/13/2022 0351 11/13/22 0615  WBC 9.1 14.6* 4.5 4.3  --  10.8*  NEUTROABS  --  13.4* 4.3  --   --   --   HGB 7.5* 8.4* 7.5* 8.3* 9.2* 7.3*  HCT 24.6* 27.1* 26.3* 29.3* 27.0* 26.1*  MCV 93.9 95.1 100.0 101.7*  --  100.0  PLT 177 203 78* 99*  --  103*    Basic Metabolic Panel: Recent Labs  Lab 10/28/22 0416 10/29/22 0410 10/26/2022 1210 11/12/2022 1740 November 13, 2022 0011 2022-11-13 0351 11-13-22 0615  NA 136 137 138  --  134* 136 140  K 2.9* 3.8 5.1  --  5.6* 5.6* 5.8*  CL 96* 99 99  --  95*  --  97*  CO2 29 30 32  --  29  --  37*  GLUCOSE 148* 122* 168*  --  291*  --  54*  BUN 21 19 50*  --  51*  --  55*  CREATININE 0.42* 0.44* 0.58* 0.53* 0.77  --  0.60*  CALCIUM 7.3* 7.5* 8.0*  --  7.9*  --  7.7*  MG 1.6* 2.3  --  2.2 2.0  --  2.1  PHOS  --  4.1  --  2.4*  --   --  2.7   GFR: Estimated Creatinine Clearance: 70.3 mL/min (A) (by C-G formula based on SCr of 0.6 mg/dL (L)). Recent Labs  Lab 10/28/22 0416 10/29/22 0410 10/25/2022 1303 11/09/2022 1305 10/25/2022 1740 Nov 13, 2022 0612 11/13/22 0615  PROCALCITON 0.20  --   --   --  0.20  --   --   WBC 9.1 14.6* 4.5  --  4.3  --  10.8*  LATICACIDVEN  --   --   --  1.9  --  1.2  --     Liver Function Tests: Recent Labs  Lab 10/29/22 0410 10/27/2022 1210  AST  --  15  ALT  --  16  ALKPHOS  --  163*  BILITOT  --  0.4  PROT  --  5.2*  ALBUMIN 1.9* 1.5*   No results for input(s): "LIPASE", "AMYLASE" in the last 168 hours. No results for input(s): "AMMONIA" in the last 168 hours.  ABG    Component Value Date/Time   PHART 7.086 (LL) 2022-11-13 0351   PCO2ART 102.3 (HH) 2022/11/13 0351   PO2ART  69 (L) 2022-11-13 0351   HCO3 31.3 (H) Nov 13, 2022 0351   TCO2 35 (H) 11-13-22 0351   ACIDBASEDEF 2.0 05/30/2021 0052   O2SAT 86 November 13, 2022 0351     Coagulation Profile: Recent Labs  Lab 10/30/2022 1210  INR 1.1    Cardiac Enzymes: No results for input(s): "CKTOTAL", "CKMB", "CKMBINDEX", "TROPONINI" in the last 168 hours.  HbA1C: Hgb A1c MFr Bld  Date/Time Value Ref Range Status  09/25/2022 09:10 AM 5.8 (H) 4.8 - 5.6 % Final    Comment:    (NOTE) Pre diabetes:          5.7%-6.4%  Diabetes:              >  6.4%  Glycemic control for   <7.0% adults with diabetes   01/04/2022 11:22 PM 5.3 4.8 - 5.6 % Final    Comment:    (NOTE)         Prediabetes: 5.7 - 6.4         Diabetes: >6.4         Glycemic control for adults with diabetes: <7.0     CBG: Recent Labs  Lab 2022/11/18 1947 2022/11/18 2351 10/26/2022 0055 11/11/2022 0349 11/06/2022 0750  GLUCAP 75 63* 86 78 75    Review of Systems:   Unable to assess   Past Medical History:  He,  has a past medical history of Aspiration pneumonia (HCC) (03/25/2021), CHF (congestive heart failure) (HCC), Coronary artery disease, Essential tremor, GERD (gastroesophageal reflux disease), GI bleed (2019), Headache, Hypothyroidism, Low blood sugar, Mitral regurgitation, Mitral valve prolapse, Osteopenia (2021), Paroxysmal atrial fibrillation (HCC), Seizure-like activity (HCC), and Tremor.   Surgical History:   Past Surgical History:  Procedure Laterality Date   COLONOSCOPY WITH PROPOFOL N/A 02/19/2017   Procedure: COLONOSCOPY WITH PROPOFOL;  Surgeon: Charlott Rakes, MD;  Location: [facility];  Service: Endoscopy;  Laterality: N/A;   IR GASTR TUBE CONVERT GASTR-JEJ PER W/FL MOD SED  09/24/2022   IR GASTROSTOMY TUBE MOD SED  10/18/2020   IR IVC FILTER PLMT / S&I /IMG GUID/MOD SED  11/01/2020   IR REPLACE G-TUBE SIMPLE WO FLUORO  03/15/2022   IR REPLACE G-TUBE SIMPLE WO FLUORO  06/30/2022   IR REPLACE G-TUBE SIMPLE WO FLUORO  09/09/2022    IR REPLC GASTRO/COLONIC TUBE PERCUT W/FLUORO  10/28/2022   laser eye surgery for retina detachment     MITRAL VALVE REPAIR  01/2003   monitor  02/05/2006   polyp removal     TONSILLECTOMY     tooth removal     as a teenager   TRACHEOSTOMY TUBE PLACEMENT N/A 10/26/2020   Procedure: TRACHEOSTOMY;  Surgeon: Drema Halon, MD;  Location: [facility] OR;  Service: ENT;  Laterality: N/A;     Social History:   reports that he has never smoked. He has never used smokeless tobacco. He reports current alcohol use. He reports that he does not use drugs.   Family History:  His family history includes Asthma in his daughter; CAD in his mother; Epilepsy in his son; Heart disease in his mother; Heart failure in his father, maternal grandmother, and paternal grandmother; Migraines in his mother; Pneumonia in his maternal grandfather; Skin cancer in his father; Stroke in his paternal grandfather; Tremor in his father; Valvular heart disease in his father. There is no history of Parkinson's disease.   Allergies Allergies  Allergen Reactions   Prednisone Other (See Comments)    Documented on MAR Unknown reaction   Cortisone Other (See Comments)    Documented on MAR Unknown reaction     Home Medications  Prior to Admission medications   Medication Sig Start Date End Date Taking? Authorizing Provider  acetaminophen (TYLENOL) 325 MG tablet Place 650 mg into feeding tube every 8 (eight) hours as needed for fever, headache or moderate pain.   Yes [provider]  amiodarone (PACERONE) 200 MG tablet Place 1 tablet (200 mg total) into feeding tube daily. 07/05/22  Yes Simonne Martinet, NP  budesonide (PULMICORT) 0.5 MG/2ML nebulizer solution Take 0.5 mg by nebulization every 12 (twelve) hours.   Yes [provider]  doxazosin (CARDURA) 4 MG tablet Take 6 mg by mouth daily.   Yes  [provider]  folic acid (FOLVITE) 1 MG tablet Place 1 tablet (1 mg total) into feeding tube daily.  10/29/22  Yes Albustami, Flonnie Hailstone, MD  gabapentin (NEURONTIN) 100 MG capsule 200 mg every 12 (twelve) hours. Via tube   Yes [provider]  heparin 5000 UNIT/ML injection Inject 5,000 Units into the skin every 8 (eight) hours.   Yes [provider]  lansoprazole (PREVACID) 30 MG capsule Place 30 mg into feeding tube daily at 12 noon.   Yes [provider]  levalbuterol (XOPENEX) 1.25 MG/3ML nebulizer solution Take 1.25 mg by nebulization every 6 (six) hours.   Yes [provider]  levothyroxine (SYNTHROID) 25 MCG tablet Place 25 mcg into feeding tube daily before breakfast.   Yes [provider]  LORazepam (ATIVAN) 1 MG tablet Place 1 mg into feeding tube every 8 (eight) hours.   Yes [provider]  midodrine (PROAMATINE) 10 MG tablet Take 1.5 tablets (15 mg total) by mouth every 8 (eight) hours. 10/29/22  Yes Albustami, Flonnie Hailstone, MD  Multiple Vitamin (MULTIVITAMIN) tablet Place 1 tablet into feeding tube daily.   Yes [provider]  Nutritional Supplements (FEEDING SUPPLEMENT, OSMOLITE 1.5 CAL,) LIQD Place 1,000 mLs into feeding tube continuous. 09/10/22  Yes Masters, Katie, DO  Omega-3 Fatty Acids (FISH OIL) 1000 MG CAPS Place 1,000 mg into feeding tube daily.   Yes [provider]  polyethylene glycol (MIRALAX / GLYCOLAX) 17 g packet Place 17 g into feeding tube daily.   Yes [provider]  sertraline (ZOLOFT) 100 MG tablet Place 100 mg into feeding tube daily.   Yes [provider]  sodium chloride (OCEAN) 0.65 % SOLN nasal spray Place 1 spray into both nostrils every 8 (eight) hours.   Yes [provider]     Critical care time:   CRITICAL CARE Performed by: Karren Burly   Total critical care time: 31 minutes  Critical care time was exclusive of separately billable procedures and treating other patients.  Critical care was necessary to treat or prevent imminent or life-threatening  deterioration.  Critical care was time spent personally by me on the following activities: development of treatment plan with patient and/or surrogate as well as nursing, discussions with consultants, evaluation of patient's response to treatment, examination of patient, obtaining history from patient or surrogate, ordering and performing treatments and interventions, ordering and review of laboratory studies, ordering and review of radiographic studies, pulse oximetry and re-evaluation of patient's condition.  Karren Burly, MD  [facility] Personal contact information can be found on Amion  If no contact or response made please call [phone] 11/09/2022, 2:31 PM

## 2022-11-21 NOTE — Progress Notes (Signed)
Arterial Catheter Insertion Procedure Note  Austin Davis  [id number]  [birth year]  Date:11/20/2022  Time:3:59 AM    Provider Performing: Ancil Boozer    Procedure: Insertion of Arterial Line ([id number]) without US guidance  Indication(s) Blood pressure monitoring and/or need for frequent ABGs  Consent Unable to obtain consent due to emergent nature of procedure.  Anesthesia None   Time Out Verified patient identification, verified procedure, site/side was marked, verified correct patient position, special equipment/implants available, medications/allergies/relevant history reviewed, required imaging and test results available.   Sterile Technique Maximal sterile technique including full sterile barrier drape, hand hygiene, sterile gown, sterile gloves, mask, hair covering, sterile ultrasound probe cover (if used).   Procedure Description Area of catheter insertion was cleaned with chlorhexidine and draped in sterile fashion. Without real-time ultrasound guidance an arterial catheter was placed into the left radial artery.  Appropriate arterial tracings confirmed on monitor.     Complications/Tolerance None; patient tolerated the procedure well.   EBL Minimal   Specimen(s) None

## 2022-11-21 NOTE — Progress Notes (Signed)
RT compassionately took patient off the vent for comfort measures per MD order and family wishes, with RN at bedside.

## 2022-11-21 DEATH — deceased

## 2022-12-08 LAB — URINE CULTURE: Culture: >=100000 — AB

## 2023-09-08 ENCOUNTER — Other Ambulatory Visit: Payer: Self-pay

## 2023-12-16 ENCOUNTER — Other Ambulatory Visit (HOSPITAL_COMMUNITY): Payer: Self-pay
